# Patient Record
Sex: Female | Born: 1950 | Race: White | Hispanic: No | Marital: Single | Smoking: Never smoker
Health system: Southern US, Community
[De-identification: ages and names within clinical notes are randomized; demographics above are authoritative.]

## PROBLEM LIST (undated history)

## (undated) ENCOUNTER — Encounter

## (undated) ENCOUNTER — Encounter: Attending: Hematology | Primary: Hematology

## (undated) ENCOUNTER — Telehealth

## (undated) ENCOUNTER — Ambulatory Visit

## (undated) ENCOUNTER — Encounter: Attending: Pharmacist | Primary: Pharmacist

## (undated) ENCOUNTER — Telehealth: Attending: Adult Health | Primary: Adult Health

## (undated) ENCOUNTER — Ambulatory Visit: Payer: PRIVATE HEALTH INSURANCE | Attending: Adult Health | Primary: Adult Health

## (undated) ENCOUNTER — Telehealth: Attending: Hematology | Primary: Hematology

## (undated) ENCOUNTER — Ambulatory Visit: Payer: PRIVATE HEALTH INSURANCE

## (undated) ENCOUNTER — Ambulatory Visit: Payer: PRIVATE HEALTH INSURANCE | Attending: Hematology | Primary: Hematology

## (undated) ENCOUNTER — Ambulatory Visit: Attending: Hematology & Oncology | Primary: Hematology & Oncology

## (undated) ENCOUNTER — Ambulatory Visit: Payer: PRIVATE HEALTH INSURANCE | Attending: Pharmacist | Primary: Pharmacist

## (undated) ENCOUNTER — Encounter: Attending: Adult Health | Primary: Adult Health

## (undated) ENCOUNTER — Telehealth: Attending: Hematology & Oncology | Primary: Hematology & Oncology

## (undated) DIAGNOSIS — F419 Anxiety disorder, unspecified: Secondary | ICD-10-CM

## (undated) DIAGNOSIS — T8859XA Other complications of anesthesia, initial encounter: Secondary | ICD-10-CM

## (undated) DIAGNOSIS — R002 Palpitations: Secondary | ICD-10-CM

## (undated) DIAGNOSIS — E7800 Pure hypercholesterolemia, unspecified: Secondary | ICD-10-CM

## (undated) DIAGNOSIS — Z8719 Personal history of other diseases of the digestive system: Secondary | ICD-10-CM

## (undated) DIAGNOSIS — Z85038 Personal history of other malignant neoplasm of large intestine: Secondary | ICD-10-CM

## (undated) DIAGNOSIS — E039 Hypothyroidism, unspecified: Secondary | ICD-10-CM

## (undated) DIAGNOSIS — M1990 Unspecified osteoarthritis, unspecified site: Secondary | ICD-10-CM

## (undated) DIAGNOSIS — Z923 Personal history of irradiation: Secondary | ICD-10-CM

## (undated) DIAGNOSIS — C50919 Malignant neoplasm of unspecified site of unspecified female breast: Secondary | ICD-10-CM

## (undated) DIAGNOSIS — E785 Hyperlipidemia, unspecified: Secondary | ICD-10-CM

## (undated) DIAGNOSIS — K769 Liver disease, unspecified: Secondary | ICD-10-CM

## (undated) DIAGNOSIS — R112 Nausea with vomiting, unspecified: Secondary | ICD-10-CM

## (undated) DIAGNOSIS — Z9889 Other specified postprocedural states: Secondary | ICD-10-CM

## (undated) DIAGNOSIS — G609 Hereditary and idiopathic neuropathy, unspecified: Secondary | ICD-10-CM

## (undated) DIAGNOSIS — N200 Calculus of kidney: Secondary | ICD-10-CM

## (undated) DIAGNOSIS — Z9221 Personal history of antineoplastic chemotherapy: Secondary | ICD-10-CM

## (undated) DIAGNOSIS — C189 Malignant neoplasm of colon, unspecified: Secondary | ICD-10-CM

## (undated) DIAGNOSIS — K219 Gastro-esophageal reflux disease without esophagitis: Secondary | ICD-10-CM

## (undated) DIAGNOSIS — Z87442 Personal history of urinary calculi: Secondary | ICD-10-CM

## (undated) DIAGNOSIS — M810 Age-related osteoporosis without current pathological fracture: Secondary | ICD-10-CM

## (undated) DIAGNOSIS — C73 Malignant neoplasm of thyroid gland: Secondary | ICD-10-CM

## (undated) DIAGNOSIS — K7689 Other specified diseases of liver: Secondary | ICD-10-CM

## (undated) HISTORY — DX: Other specified diseases of liver: K76.89

## (undated) HISTORY — DX: Calculus of kidney: N20.0

## (undated) HISTORY — DX: Age-related osteoporosis without current pathological fracture: M81.0

## (undated) HISTORY — DX: Hyperlipidemia, unspecified: E78.5

## (undated) HISTORY — PX: LITHOTRIPSY: SUR834

## (undated) HISTORY — PX: ESOPHAGOGASTRODUODENOSCOPY: SHX1529

## (undated) HISTORY — PX: LAPAROSCOPIC PARTIAL COLECTOMY: SHX5907

## (undated) HISTORY — DX: Malignant neoplasm of thyroid gland: C73

## (undated) HISTORY — DX: Anxiety disorder, unspecified: F41.9

## (undated) HISTORY — DX: Personal history of other malignant neoplasm of large intestine: Z85.038

## (undated) HISTORY — DX: Palpitations: R00.2

## (undated) HISTORY — DX: Gastro-esophageal reflux disease without esophagitis: K21.9

## (undated) HISTORY — PX: COLONOSCOPY: SHX174

## (undated) HISTORY — DX: Hereditary and idiopathic neuropathy, unspecified: G60.9

## (undated) MED ORDER — LOPERAMIDE 2 MG CAPSULE: Freq: Four times a day (QID) | ORAL | 0 days | PRN

---

## 1983-05-05 HISTORY — PX: APPENDECTOMY: SHX54

## 1988-05-04 HISTORY — PX: DILATION AND CURETTAGE, DIAGNOSTIC / THERAPEUTIC: SUR384

## 1988-05-04 HISTORY — PX: DILATION AND CURETTAGE OF UTERUS: SHX78

## 1993-05-04 HISTORY — PX: CHOLECYSTECTOMY: SHX55

## 1993-08-26 HISTORY — PX: SIGMOIDOSCOPY: SUR1295

## 2000-05-04 DIAGNOSIS — C73 Malignant neoplasm of thyroid gland: Secondary | ICD-10-CM

## 2000-05-04 HISTORY — PX: THYROID LOBECTOMY: SHX420

## 2000-05-04 HISTORY — DX: Malignant neoplasm of thyroid gland: C73

## 2004-04-03 ENCOUNTER — Ambulatory Visit: Payer: Self-pay | Admitting: Unknown Physician Specialty

## 2004-04-07 ENCOUNTER — Ambulatory Visit: Payer: Self-pay | Admitting: Unknown Physician Specialty

## 2004-04-22 ENCOUNTER — Inpatient Hospital Stay: Payer: Self-pay | Admitting: General Surgery

## 2004-05-07 ENCOUNTER — Ambulatory Visit: Payer: Self-pay | Admitting: Internal Medicine

## 2004-05-09 ENCOUNTER — Ambulatory Visit: Payer: Self-pay | Admitting: Surgery

## 2004-06-04 ENCOUNTER — Ambulatory Visit: Payer: Self-pay | Admitting: Internal Medicine

## 2004-07-02 ENCOUNTER — Ambulatory Visit: Payer: Self-pay | Admitting: Internal Medicine

## 2004-07-02 ENCOUNTER — Inpatient Hospital Stay: Payer: Self-pay | Admitting: Internal Medicine

## 2004-08-02 ENCOUNTER — Ambulatory Visit: Payer: Self-pay | Admitting: Internal Medicine

## 2004-08-04 ENCOUNTER — Inpatient Hospital Stay: Payer: Self-pay | Admitting: Internal Medicine

## 2004-09-01 ENCOUNTER — Ambulatory Visit: Payer: Self-pay | Admitting: Internal Medicine

## 2004-10-02 ENCOUNTER — Ambulatory Visit: Payer: Self-pay | Admitting: Internal Medicine

## 2004-11-01 ENCOUNTER — Ambulatory Visit: Payer: Self-pay | Admitting: Internal Medicine

## 2004-12-02 ENCOUNTER — Ambulatory Visit: Payer: Self-pay | Admitting: Internal Medicine

## 2005-01-02 ENCOUNTER — Ambulatory Visit: Payer: Self-pay | Admitting: Internal Medicine

## 2005-02-01 ENCOUNTER — Ambulatory Visit: Payer: Self-pay | Admitting: Internal Medicine

## 2005-03-04 ENCOUNTER — Ambulatory Visit: Payer: Self-pay | Admitting: Internal Medicine

## 2005-04-03 ENCOUNTER — Ambulatory Visit: Payer: Self-pay | Admitting: Internal Medicine

## 2005-05-04 ENCOUNTER — Ambulatory Visit: Payer: Self-pay | Admitting: Internal Medicine

## 2005-06-04 ENCOUNTER — Ambulatory Visit: Payer: Self-pay | Admitting: Internal Medicine

## 2005-06-29 ENCOUNTER — Ambulatory Visit: Payer: Self-pay | Admitting: Unknown Physician Specialty

## 2005-07-02 ENCOUNTER — Ambulatory Visit: Payer: Self-pay | Admitting: Internal Medicine

## 2005-08-02 ENCOUNTER — Ambulatory Visit: Payer: Self-pay | Admitting: Internal Medicine

## 2005-09-01 ENCOUNTER — Ambulatory Visit: Payer: Self-pay | Admitting: Internal Medicine

## 2005-10-02 ENCOUNTER — Ambulatory Visit: Payer: Self-pay | Admitting: Internal Medicine

## 2005-11-01 ENCOUNTER — Ambulatory Visit: Payer: Self-pay | Admitting: Internal Medicine

## 2005-12-03 ENCOUNTER — Ambulatory Visit: Payer: Self-pay | Admitting: Internal Medicine

## 2006-01-02 ENCOUNTER — Ambulatory Visit: Payer: Self-pay | Admitting: Internal Medicine

## 2006-02-01 ENCOUNTER — Ambulatory Visit: Payer: Self-pay | Admitting: Internal Medicine

## 2006-03-04 ENCOUNTER — Ambulatory Visit: Payer: Self-pay | Admitting: Internal Medicine

## 2006-04-03 ENCOUNTER — Ambulatory Visit: Payer: Self-pay | Admitting: Internal Medicine

## 2006-05-27 ENCOUNTER — Ambulatory Visit: Payer: Self-pay | Admitting: Internal Medicine

## 2006-06-04 ENCOUNTER — Ambulatory Visit: Payer: Self-pay | Admitting: Internal Medicine

## 2006-07-03 ENCOUNTER — Ambulatory Visit: Payer: Self-pay | Admitting: Internal Medicine

## 2006-08-03 ENCOUNTER — Ambulatory Visit: Payer: Self-pay | Admitting: Internal Medicine

## 2006-09-09 ENCOUNTER — Ambulatory Visit: Payer: Self-pay | Admitting: Internal Medicine

## 2006-09-09 ENCOUNTER — Ambulatory Visit: Payer: Self-pay | Admitting: Radiation Oncology

## 2006-09-15 ENCOUNTER — Other Ambulatory Visit: Payer: Self-pay

## 2006-09-15 ENCOUNTER — Inpatient Hospital Stay: Payer: Self-pay | Admitting: Internal Medicine

## 2006-09-30 ENCOUNTER — Ambulatory Visit: Payer: Self-pay | Admitting: Internal Medicine

## 2006-10-03 ENCOUNTER — Ambulatory Visit: Payer: Self-pay | Admitting: Internal Medicine

## 2006-10-14 ENCOUNTER — Ambulatory Visit: Payer: Self-pay | Admitting: Internal Medicine

## 2006-10-27 ENCOUNTER — Ambulatory Visit: Payer: Self-pay | Admitting: Surgery

## 2006-11-01 ENCOUNTER — Ambulatory Visit: Payer: Self-pay | Admitting: Internal Medicine

## 2006-11-02 ENCOUNTER — Ambulatory Visit: Payer: Self-pay | Admitting: Internal Medicine

## 2007-02-02 ENCOUNTER — Ambulatory Visit: Payer: Self-pay | Admitting: Internal Medicine

## 2007-03-02 ENCOUNTER — Ambulatory Visit: Payer: Self-pay | Admitting: Internal Medicine

## 2007-03-05 ENCOUNTER — Ambulatory Visit: Payer: Self-pay | Admitting: Internal Medicine

## 2007-03-15 ENCOUNTER — Ambulatory Visit: Payer: Self-pay | Admitting: Ophthalmology

## 2007-04-04 ENCOUNTER — Ambulatory Visit: Payer: Self-pay | Admitting: Internal Medicine

## 2007-04-20 ENCOUNTER — Ambulatory Visit: Payer: Self-pay | Admitting: Surgery

## 2007-05-05 DIAGNOSIS — C50919 Malignant neoplasm of unspecified site of unspecified female breast: Secondary | ICD-10-CM

## 2007-05-05 HISTORY — PX: BREAST BIOPSY: SHX20

## 2007-05-05 HISTORY — PX: BREAST LUMPECTOMY: SHX2

## 2007-05-05 HISTORY — DX: Malignant neoplasm of unspecified site of unspecified female breast: C50.919

## 2007-06-05 ENCOUNTER — Ambulatory Visit: Payer: Self-pay | Admitting: Internal Medicine

## 2007-06-09 ENCOUNTER — Ambulatory Visit: Payer: Self-pay | Admitting: Surgery

## 2007-06-29 ENCOUNTER — Ambulatory Visit: Payer: Self-pay | Admitting: Internal Medicine

## 2007-07-03 ENCOUNTER — Ambulatory Visit: Payer: Self-pay | Admitting: Internal Medicine

## 2007-09-21 ENCOUNTER — Ambulatory Visit: Payer: Self-pay | Admitting: Unknown Physician Specialty

## 2007-10-06 ENCOUNTER — Ambulatory Visit: Payer: Self-pay | Admitting: Urology

## 2007-11-02 ENCOUNTER — Ambulatory Visit: Payer: Self-pay | Admitting: Internal Medicine

## 2007-12-29 ENCOUNTER — Ambulatory Visit: Payer: Self-pay | Admitting: Internal Medicine

## 2008-01-03 ENCOUNTER — Ambulatory Visit: Payer: Self-pay | Admitting: Internal Medicine

## 2008-01-25 ENCOUNTER — Ambulatory Visit: Payer: Self-pay | Admitting: Cardiovascular Disease

## 2008-01-25 ENCOUNTER — Other Ambulatory Visit: Payer: Self-pay

## 2008-01-25 ENCOUNTER — Ambulatory Visit: Payer: Self-pay | Admitting: Surgery

## 2008-02-01 ENCOUNTER — Ambulatory Visit: Payer: Self-pay | Admitting: Surgery

## 2008-02-02 ENCOUNTER — Ambulatory Visit: Payer: Self-pay | Admitting: Internal Medicine

## 2008-03-01 ENCOUNTER — Ambulatory Visit: Payer: Self-pay | Admitting: Surgery

## 2008-03-04 ENCOUNTER — Ambulatory Visit: Payer: Self-pay | Admitting: Internal Medicine

## 2008-03-04 ENCOUNTER — Ambulatory Visit: Payer: Self-pay | Admitting: Radiation Oncology

## 2008-04-03 ENCOUNTER — Ambulatory Visit: Payer: Self-pay | Admitting: Radiation Oncology

## 2008-04-11 ENCOUNTER — Ambulatory Visit: Payer: Self-pay | Admitting: Internal Medicine

## 2008-05-04 ENCOUNTER — Ambulatory Visit: Payer: Self-pay | Admitting: Internal Medicine

## 2008-05-04 ENCOUNTER — Ambulatory Visit: Payer: Self-pay | Admitting: Radiation Oncology

## 2008-05-17 ENCOUNTER — Inpatient Hospital Stay: Payer: Self-pay | Admitting: Internal Medicine

## 2008-06-04 ENCOUNTER — Ambulatory Visit: Payer: Self-pay | Admitting: Radiation Oncology

## 2008-06-04 ENCOUNTER — Ambulatory Visit: Payer: Self-pay | Admitting: Internal Medicine

## 2008-07-02 ENCOUNTER — Ambulatory Visit: Payer: Self-pay | Admitting: Radiation Oncology

## 2008-07-02 ENCOUNTER — Ambulatory Visit: Payer: Self-pay | Admitting: Internal Medicine

## 2008-08-02 ENCOUNTER — Ambulatory Visit: Payer: Self-pay | Admitting: Internal Medicine

## 2008-08-02 ENCOUNTER — Ambulatory Visit: Payer: Self-pay | Admitting: Radiation Oncology

## 2008-09-01 ENCOUNTER — Ambulatory Visit: Payer: Self-pay | Admitting: Internal Medicine

## 2008-09-01 ENCOUNTER — Ambulatory Visit: Payer: Self-pay | Admitting: Radiation Oncology

## 2008-09-03 ENCOUNTER — Ambulatory Visit: Payer: Self-pay | Admitting: Cardiology

## 2008-09-18 ENCOUNTER — Encounter: Payer: Self-pay | Admitting: Cardiology

## 2008-09-18 ENCOUNTER — Ambulatory Visit: Payer: Self-pay

## 2008-09-18 DIAGNOSIS — E7800 Pure hypercholesterolemia, unspecified: Secondary | ICD-10-CM | POA: Insufficient documentation

## 2008-09-18 DIAGNOSIS — Z85038 Personal history of other malignant neoplasm of large intestine: Secondary | ICD-10-CM | POA: Insufficient documentation

## 2008-09-18 DIAGNOSIS — Z853 Personal history of malignant neoplasm of breast: Secondary | ICD-10-CM | POA: Insufficient documentation

## 2008-09-18 DIAGNOSIS — C73 Malignant neoplasm of thyroid gland: Secondary | ICD-10-CM | POA: Insufficient documentation

## 2008-09-18 DIAGNOSIS — Z85850 Personal history of malignant neoplasm of thyroid: Secondary | ICD-10-CM

## 2008-09-18 DIAGNOSIS — R002 Palpitations: Secondary | ICD-10-CM | POA: Insufficient documentation

## 2008-09-18 DIAGNOSIS — C50919 Malignant neoplasm of unspecified site of unspecified female breast: Secondary | ICD-10-CM | POA: Insufficient documentation

## 2008-09-18 DIAGNOSIS — G629 Polyneuropathy, unspecified: Secondary | ICD-10-CM

## 2008-09-18 DIAGNOSIS — G620 Drug-induced polyneuropathy: Secondary | ICD-10-CM | POA: Insufficient documentation

## 2008-09-18 DIAGNOSIS — G609 Hereditary and idiopathic neuropathy, unspecified: Secondary | ICD-10-CM | POA: Insufficient documentation

## 2008-09-18 HISTORY — DX: Personal history of malignant neoplasm of thyroid: Z85.850

## 2008-09-28 ENCOUNTER — Telehealth: Payer: Self-pay | Admitting: Cardiology

## 2008-10-02 ENCOUNTER — Ambulatory Visit: Payer: Self-pay | Admitting: Internal Medicine

## 2008-10-02 ENCOUNTER — Ambulatory Visit: Payer: Self-pay | Admitting: Radiation Oncology

## 2008-11-01 ENCOUNTER — Ambulatory Visit: Payer: Self-pay | Admitting: Internal Medicine

## 2008-11-01 ENCOUNTER — Ambulatory Visit: Payer: Self-pay | Admitting: Radiation Oncology

## 2008-12-02 ENCOUNTER — Ambulatory Visit: Payer: Self-pay | Admitting: Internal Medicine

## 2008-12-18 ENCOUNTER — Ambulatory Visit: Payer: Self-pay | Admitting: Internal Medicine

## 2009-01-02 ENCOUNTER — Ambulatory Visit: Payer: Self-pay | Admitting: Internal Medicine

## 2009-02-01 ENCOUNTER — Ambulatory Visit: Payer: Self-pay | Admitting: Internal Medicine

## 2009-03-04 ENCOUNTER — Ambulatory Visit: Payer: Self-pay | Admitting: Internal Medicine

## 2009-03-20 ENCOUNTER — Ambulatory Visit: Payer: Self-pay | Admitting: Internal Medicine

## 2009-04-03 ENCOUNTER — Ambulatory Visit: Payer: Self-pay | Admitting: Internal Medicine

## 2009-05-16 ENCOUNTER — Ambulatory Visit: Payer: Self-pay | Admitting: Urology

## 2009-06-04 ENCOUNTER — Ambulatory Visit: Payer: Self-pay | Admitting: Internal Medicine

## 2009-06-20 ENCOUNTER — Ambulatory Visit: Payer: Self-pay | Admitting: Internal Medicine

## 2009-07-02 ENCOUNTER — Ambulatory Visit: Payer: Self-pay | Admitting: Internal Medicine

## 2009-07-18 ENCOUNTER — Ambulatory Visit: Payer: Self-pay | Admitting: Internal Medicine

## 2009-07-29 ENCOUNTER — Ambulatory Visit: Payer: Self-pay | Admitting: Unknown Physician Specialty

## 2009-08-02 ENCOUNTER — Ambulatory Visit: Payer: Self-pay | Admitting: Internal Medicine

## 2009-08-14 ENCOUNTER — Ambulatory Visit: Payer: Self-pay | Admitting: Internal Medicine

## 2009-09-01 ENCOUNTER — Ambulatory Visit: Payer: Self-pay | Admitting: Internal Medicine

## 2009-09-11 ENCOUNTER — Ambulatory Visit: Payer: Self-pay | Admitting: Ophthalmology

## 2009-09-16 ENCOUNTER — Ambulatory Visit: Payer: Self-pay | Admitting: Ophthalmology

## 2009-10-07 ENCOUNTER — Ambulatory Visit: Payer: Self-pay | Admitting: Unknown Physician Specialty

## 2009-10-10 ENCOUNTER — Ambulatory Visit: Payer: Self-pay | Admitting: Unknown Physician Specialty

## 2009-10-29 ENCOUNTER — Ambulatory Visit: Payer: Self-pay | Admitting: Sports Medicine

## 2009-11-01 ENCOUNTER — Ambulatory Visit: Payer: Self-pay | Admitting: Internal Medicine

## 2009-11-19 ENCOUNTER — Ambulatory Visit: Payer: Self-pay | Admitting: Internal Medicine

## 2009-11-21 LAB — CEA: CEA: 1.9 ng/mL (ref 0.0–4.7)

## 2009-11-21 LAB — CANCER ANTIGEN 27.29: CA 27.29: 19.4 U/mL (ref 0.0–38.6)

## 2009-12-02 ENCOUNTER — Ambulatory Visit: Payer: Self-pay | Admitting: Internal Medicine

## 2010-01-28 ENCOUNTER — Ambulatory Visit: Payer: Self-pay | Admitting: Internal Medicine

## 2010-01-30 ENCOUNTER — Ambulatory Visit: Payer: Self-pay | Admitting: Internal Medicine

## 2010-05-23 ENCOUNTER — Ambulatory Visit: Payer: Self-pay | Admitting: Internal Medicine

## 2010-05-25 LAB — CANCER ANTIGEN 27.29: CA 27.29: 19.9 U/mL

## 2010-05-25 LAB — CEA: CEA: 1.4 ng/mL

## 2010-06-04 ENCOUNTER — Ambulatory Visit: Payer: Self-pay | Admitting: Internal Medicine

## 2010-08-05 ENCOUNTER — Ambulatory Visit: Payer: Self-pay | Admitting: Internal Medicine

## 2010-09-16 NOTE — Assessment & Plan Note (Signed)
[facility] OFFICE NOTE   NAME:Debra Hale, Debra Hale                       MRN:          [id number]  DATE:09/03/2008                            DOB:          1951-03-17    PRIMARY CARE PHYSICIAN:  Dale [place] at the [facility].   ONCOLOGIST:  Janese Banks, MD at the [facility].   HISTORY OF PRESENT ILLNESS:  This is a 60 year old with a history of  breast cancer, undergoing radiation currently, who presents to  Cardiology Clinic for evaluation of palpitations.  The patient states  that she has had some palpitations since she was 69 or 60 years old.  They have been sporadic over the years.  When she was in her 30s, she  had a Holter monitor that per her report was okay.  In September 2009,  she states she began having quite significant palpitations.  She went to  [facility] through the emergency department.  She  had an exercise treadmill test per her report that was normal.  There  were no abnormalities noted on EKG or on telemetry.  She was in the  hospital overnight.  The patient states that over the last 2-3 weeks,  she has had quite severe palpitations again.  They have been daily,  coming on for most of the day.  She states she will feel skipped beats  and also occasionally she will feel like her heart races.  She has never  had any episodes of syncope or lightheadedness.  She does not get chest  pain.  She does not get shortness of breath with exertion.  Ever since  she did chemotherapy earlier this year, she has been fatigued.  She  states the main problem with the palpitations is that they are  bothersome and can keep her from sleeping at night.  She does feel them  more if she lies on her left side at night.  As mentioned, she does get  the symptoms daily.   PAST MEDICAL HISTORY:  1. Breast cancer.  In September 2009, the patient had a  lumpectomy in      her right breast.  This showed stage I breast cancer.  She did have      adjuvant chemotherapy with Taxotere and Cytoxan and she is now      undergoing radiation.  2. History of colon cancer, this was stage III.  She had a colectomy      in December 2005 and adjuvant chemotherapy and radiation.  3. History of palpitations as described above.  4. Papillary thyroid carcinoma.  This was in 2002.  The patient was      treated with thyroidectomy and radioactive iodine.  She is now      hypothyroid on Synthroid.  5. Appendectomy.  6. Cholecystectomy.  7. Hyperlipidemia.  8. 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   FAMILY HISTORY:  The patient has a family history of breast, liver, and  lung cancer.  Her brother had an MI, she believes in his 4s.  SOCIAL HISTORY:  The patient does not smoke.  She does not drink any  alcohol.  She lives by herself in [place].  She does have a boyfriend  who is involved.   REVIEW OF SYSTEMS:  All systems were reviewed and were negative except  as noted in the history of present illness.   MEDICATIONS:  1. Gabapentin.  2. Zocor 10 mg daily.  3. Protonix 40 mg daily.  4. Levoxyl 88 mcg daily.  5. Lexapro 20 mg daily.   LABORATORY DATA:  Most recent labs from April 2010, potassium was  normal, creatinine 0.95.  TSH was 0, however, the total T4 was 11.8,  which is normal.   EKG was reviewed showed normal sinus rhythm, is in normal EKG.   PHYSICAL EXAMINATION:  VITAL SIGNS:  Blood pressure 117/79 and heart  rate is 72 and regular.  GENERAL:  This is a well-developed female in no apparent distress.  NEUROLOGIC:  Alert and oriented x3.  Normal affect.  LUNGS:  Clear to auscultation bilaterally with normal respiratory  effort.  CARDIOVASCULAR:  Heart regular S1 and S2.  No S3.  No S4.  There is no  murmur.  EXTREMITIES:  There is no peripheral edema.  There are 2+ posterior  tibial pulses bilaterally.  There  is no carotid bruit.  No clubbing or  cyanosis.  ABDOMEN:  Soft and nontender.  No hepatosplenomegaly.  Normal bowel  sounds.  SKIN:  There is some erythema over her right breast consistent with  radiation changes.  NECK:  There is no JVD.  There is no thyromegaly or thyroid nodules.  MUSCULOSKELETAL:  Normal exam.  HEENT:  Normal exam.   ASSESSMENT AND PLAN:  This is a 60 year old with a history of breast  cancer, colon cancer, and papillary thyroid carcinoma who has most  recently undergone chemotherapy  and radiation for breast cancer and  presents to Cardiology Clinic for evaluation of palpitations.  The  patient has had palpitations that sound long-term ever since she was a  child.  Workup has included a Holter monitor a number of years ago that  showed apparently no abnormalities.  She states the palpitations have  been coming on more frequently lately.  She has never had syncope or  presyncope.  Most recent TSH was 0.  However, the total T4 was normal.  Our plan will be to do a 48-hour Holter monitor to rule out any  significant arrhythmias.  I think most likely her palpitations  represents benign PACs or PVCs.  If they continued to be significantly  bothersome, we could try her on a low dose of beta-blocker to try to  decrease her symptoms.  We will also obtain an echocardiogram to assess  her LV function make sure her heart is structurally normal.  This is  especially relevant given her history of chemotherapy.  The patient will  see Dr. Lorin Picket tomorrow.  I will have her follow up with Dr. Lorin Picket  regarding her thyroid function tests.  It is  interesting that her TSH is 0, however, her T4 is within normal limits.  I think it might be a reasonable idea to repeat her free T4 and TSH to  see if that is accurate.     Marca Ancona, MD  Electronically Signed    DM/MedQ  DD: 09/03/2008  DT: 09/04/2008  Job #: [id number]   cc:   Janese Banks, MD  [facility]

## 2010-09-24 ENCOUNTER — Ambulatory Visit: Payer: Self-pay | Admitting: Internal Medicine

## 2010-10-16 ENCOUNTER — Encounter: Payer: Self-pay | Admitting: Cardiology

## 2010-10-21 ENCOUNTER — Ambulatory Visit: Payer: Self-pay | Admitting: Internal Medicine

## 2010-11-21 ENCOUNTER — Ambulatory Visit: Payer: Self-pay | Admitting: Internal Medicine

## 2010-11-22 LAB — CANCER ANTIGEN 27.29: CA 27.29: 26.7 U/mL (ref 0.0–38.6)

## 2010-11-22 LAB — CEA: CEA: 2.3 ng/mL (ref 0.0–4.7)

## 2010-12-03 ENCOUNTER — Ambulatory Visit: Payer: Self-pay | Admitting: Internal Medicine

## 2011-01-05 LAB — HM PAP SMEAR

## 2011-05-05 ENCOUNTER — Ambulatory Visit: Payer: Self-pay | Admitting: Internal Medicine

## 2011-05-18 ENCOUNTER — Ambulatory Visit: Payer: Self-pay | Admitting: Unknown Physician Specialty

## 2011-05-22 LAB — HEPATIC FUNCTION PANEL A (ARMC)
Bilirubin, Direct: 0.1 mg/dL (ref 0.00–0.20)
SGOT(AST): 13 U/L — ABNORMAL LOW (ref 15–37)
SGPT (ALT): 31 U/L

## 2011-05-22 LAB — CBC CANCER CENTER
Eosinophil #: 0.1 x10 3/mm (ref 0.0–0.7)
Eosinophil %: 2.4 %
HCT: 42.5 % (ref 35.0–47.0)
HGB: 14.7 g/dL (ref 12.0–16.0)
Lymphocyte #: 0.8 x10 3/mm — ABNORMAL LOW (ref 1.0–3.6)
Monocyte #: 0.3 x10 3/mm (ref 0.0–0.7)
Neutrophil #: 3.1 x10 3/mm (ref 1.4–6.5)
WBC: 4.3 x10 3/mm (ref 3.6–11.0)

## 2011-05-22 LAB — CREATININE, SERUM: Creatinine: 1.07 mg/dL (ref 0.60–1.30)

## 2011-05-24 LAB — CEA: CEA: 2.5 ng/mL (ref 0.0–4.7)

## 2011-05-24 LAB — CANCER ANTIGEN 27.29: CA 27.29: 12.2 U/mL (ref 0.0–38.6)

## 2011-06-05 ENCOUNTER — Ambulatory Visit: Payer: Self-pay | Admitting: Internal Medicine

## 2011-08-06 ENCOUNTER — Ambulatory Visit: Payer: Self-pay | Admitting: Internal Medicine

## 2011-08-14 ENCOUNTER — Other Ambulatory Visit: Payer: Self-pay | Admitting: Unknown Physician Specialty

## 2011-08-14 ENCOUNTER — Ambulatory Visit: Payer: Self-pay | Admitting: Unknown Physician Specialty

## 2011-08-14 LAB — CLOSTRIDIUM DIFFICILE BY PCR

## 2011-08-17 ENCOUNTER — Ambulatory Visit: Payer: Self-pay | Admitting: Unknown Physician Specialty

## 2011-11-20 ENCOUNTER — Ambulatory Visit: Payer: Self-pay | Admitting: Internal Medicine

## 2011-11-20 LAB — CBC CANCER CENTER
Basophil #: 0 x10 3/mm (ref 0.0–0.1)
Basophil %: 0.4 %
Eosinophil %: 2.3 %
HCT: 42.2 % (ref 35.0–47.0)
HGB: 14 g/dL (ref 12.0–16.0)
Lymphocyte #: 0.8 x10 3/mm — ABNORMAL LOW (ref 1.0–3.6)
Lymphocyte %: 20.2 %
MCH: 29.7 pg (ref 26.0–34.0)
MCHC: 33.2 g/dL (ref 32.0–36.0)
MCV: 90 fL (ref 80–100)
Monocyte #: 0.2 x10 3/mm (ref 0.2–0.9)
RBC: 4.71 10*6/uL (ref 3.80–5.20)

## 2011-11-20 LAB — HEPATIC FUNCTION PANEL A (ARMC)
Albumin: 3.6 g/dL (ref 3.4–5.0)
Bilirubin, Direct: 0.1 mg/dL (ref 0.00–0.20)
SGOT(AST): 18 U/L (ref 15–37)
SGPT (ALT): 43 U/L
Total Protein: 7 g/dL (ref 6.4–8.2)

## 2011-11-20 LAB — CREATININE, SERUM
Creatinine: 1.15 mg/dL (ref 0.60–1.30)
EGFR (African American): 59 — ABNORMAL LOW

## 2011-11-21 LAB — CANCER ANTIGEN 27.29: CA 27.29: 18.5 U/mL (ref 0.0–38.6)

## 2011-12-03 ENCOUNTER — Ambulatory Visit: Payer: Self-pay | Admitting: Internal Medicine

## 2012-01-03 ENCOUNTER — Ambulatory Visit: Payer: Self-pay | Admitting: Internal Medicine

## 2012-01-05 LAB — HM MAMMOGRAPHY

## 2012-01-08 ENCOUNTER — Ambulatory Visit: Payer: Self-pay | Admitting: Unknown Physician Specialty

## 2012-04-14 ENCOUNTER — Other Ambulatory Visit: Payer: Self-pay | Admitting: Internal Medicine

## 2012-04-14 NOTE — Telephone Encounter (Signed)
Pt is needing refill on Protonics. She uses [facility] on [address].

## 2012-04-15 MED ORDER — PANTOPRAZOLE SODIUM 40 MG PO TBEC: 40.0000 mg | DELAYED_RELEASE_TABLET | Freq: Every day | ORAL | Status: DC

## 2012-04-15 NOTE — Telephone Encounter (Signed)
Sent in to pharmacy.  

## 2012-04-18 ENCOUNTER — Other Ambulatory Visit: Payer: Self-pay | Admitting: *Deleted

## 2012-04-18 MED ORDER — PANTOPRAZOLE SODIUM 40 MG PO TBEC: 40.0000 mg | DELAYED_RELEASE_TABLET | Freq: Every day | ORAL | Status: DC

## 2012-04-22 ENCOUNTER — Other Ambulatory Visit: Payer: Self-pay | Admitting: *Deleted

## 2012-04-22 MED ORDER — SIMVASTATIN 10 MG PO TABS: 10.0000 mg | ORAL_TABLET | Freq: Every day | ORAL | Status: DC

## 2012-04-22 NOTE — Telephone Encounter (Signed)
Patient called and said that she was almost out of simvastatin. Sent in to pharmacy.

## 2012-05-04 ENCOUNTER — Ambulatory Visit: Payer: Self-pay | Admitting: Internal Medicine

## 2012-05-05 ENCOUNTER — Ambulatory Visit: Payer: Self-pay | Admitting: Internal Medicine

## 2012-05-06 ENCOUNTER — Encounter: Payer: Self-pay | Admitting: Internal Medicine

## 2012-05-06 ENCOUNTER — Ambulatory Visit (INDEPENDENT_AMBULATORY_CARE_PROVIDER_SITE_OTHER): Payer: Medicare Other | Admitting: Internal Medicine

## 2012-05-06 VITALS — BP 120/80 | HR 77 | Temp 98.1°F | Ht 64.0 in | Wt 146.2 lb

## 2012-05-06 DIAGNOSIS — C73 Malignant neoplasm of thyroid gland: Secondary | ICD-10-CM

## 2012-05-06 DIAGNOSIS — M8580 Other specified disorders of bone density and structure, unspecified site: Secondary | ICD-10-CM | POA: Insufficient documentation

## 2012-05-06 DIAGNOSIS — F419 Anxiety disorder, unspecified: Secondary | ICD-10-CM

## 2012-05-06 DIAGNOSIS — R319 Hematuria, unspecified: Secondary | ICD-10-CM | POA: Insufficient documentation

## 2012-05-06 DIAGNOSIS — E785 Hyperlipidemia, unspecified: Secondary | ICD-10-CM

## 2012-05-06 DIAGNOSIS — M810 Age-related osteoporosis without current pathological fracture: Secondary | ICD-10-CM | POA: Insufficient documentation

## 2012-05-06 DIAGNOSIS — K219 Gastro-esophageal reflux disease without esophagitis: Secondary | ICD-10-CM

## 2012-05-06 DIAGNOSIS — Z85038 Personal history of other malignant neoplasm of large intestine: Secondary | ICD-10-CM

## 2012-05-06 DIAGNOSIS — R5381 Other malaise: Secondary | ICD-10-CM

## 2012-05-06 DIAGNOSIS — C50919 Malignant neoplasm of unspecified site of unspecified female breast: Secondary | ICD-10-CM

## 2012-05-06 DIAGNOSIS — F411 Generalized anxiety disorder: Secondary | ICD-10-CM

## 2012-05-06 DIAGNOSIS — R5383 Other fatigue: Secondary | ICD-10-CM

## 2012-05-06 NOTE — Patient Instructions (Addendum)
It was nice seeing you today.  I want you to continue your protonix in the morning and add Zantac (ranitidine) 150mg  - 30 minutes before your evening meal.  Let me know if persistent problems.

## 2012-05-08 ENCOUNTER — Encounter: Payer: Self-pay | Admitting: Internal Medicine

## 2012-05-08 ENCOUNTER — Telehealth: Payer: Self-pay | Admitting: Internal Medicine

## 2012-05-08 MED ORDER — AZELASTINE-FLUTICASONE 137-50 MCG/ACT NA SUSP: 1.0000 | Freq: Two times a day (BID) | NASAL | Status: DC

## 2012-05-08 NOTE — Assessment & Plan Note (Signed)
Stable on current regimen.  Follow.   

## 2012-05-08 NOTE — Telephone Encounter (Signed)
Order placed for dymista

## 2012-05-08 NOTE — Assessment & Plan Note (Signed)
Low cholesterol diet and exercise.  Check lipid panel and liver function.  On simvastatin.

## 2012-05-08 NOTE — Assessment & Plan Note (Signed)
Was referred to Dr Evelene Croon for further w/up.  Recheck urinalysis.

## 2012-05-08 NOTE — Assessment & Plan Note (Signed)
Up to date with her colonoscopy.  See above.  Currently asymptomatic.

## 2012-05-08 NOTE — Progress Notes (Signed)
Subjective:    Patient ID: Debra Hale, female    DOB: 10-Dec-1950, 62 y.o.   MRN: [id number]  HPI 62 year old female with past history of thyroid carcinoma s/p surgery and XRT, colon cancer s/p laparoscopic right colectomy followed by Dr Sherrlyn Hock, nephrolithiasis, anxiety, osteoporosis and breast cancer who comes in today for a scheduled follow up.  She states she is doing well.  On Protonix.  Still having some break through symptoms.  Allergy symptoms are doing better.  No cardiac symptoms with increased activity or exertion.  Breathing stable.    Past Medical History  Diagnosis Date  . Palpitations   . Other and unspecified hyperlipidemia   . Unspecified hereditary and idiopathic peripheral neuropathy   . Personal history of malignant neoplasm of large intestine     carcinoma - cecum, s/p right laparoscopic colectomy - s/p chemotherapy and XRT  . Malignant neoplasm of breast (female), unspecified site     s/p lumpectomy  . Malignant neoplasm of thyroid gland     s/p surgery and XRT  . Anxiety   . Liver nodule     s/p negative biopsy  . Nephrolithiasis     s/p lithotripsy  . GERD (gastroesophageal reflux disease)   . Osteoporosis     Current Outpatient Prescriptions on File Prior to Visit  Medication Sig Dispense Refill  . azelastine (ASTELIN) 137 MCG/SPRAY nasal spray Place 1 spray into the nose 2 (two) times daily. Use in each nostril as directed      . clonazePAM (KLONOPIN) 1 MG tablet Take 1 mg by mouth 2 (two) times daily as needed.        Marland Kitchen escitalopram (LEXAPRO) 20 MG tablet Take 20 mg by mouth daily.        Marland Kitchen gabapentin (NEURONTIN) 100 MG capsule Take 100 mg by mouth 3 (three) times daily. Take 2 tab       . hyoscyamine (LEVSIN SL) 0.125 MG SL tablet As directed      . levothyroxine (SYNTHROID) 100 MCG tablet Take 100 mcg by mouth daily.      . pantoprazole (PROTONIX) 40 MG tablet Take 1 tablet (40 mg total) by mouth daily.  30 tablet  0  . simvastatin (ZOCOR) 10 MG  tablet Take 1 tablet (10 mg total) by mouth at bedtime.  30 tablet  0    Review of Systems Patient denies any headache, lightheadedness or dizziness.  Allergies doing better on Dyamist.  No chest pain, tightness or palpitations.  No increased shortness of breath, cough or congestion.  No nausea or vomiting.  No abdominal pain or cramping.  No bowel change, such as diarrhea, constipation, BRBPR or melana.  No urine change.        Objective:   Physical Exam Filed Vitals:   05/06/12 1022  BP: 120/80  Pulse: 77  Temp: 98.1 F (62.78 C)   62 year old female in no acute distress.   HEENT:  Nares- clear.  Oropharynx - without lesions. NECK:  Supple.  Nontender.  No audible bruit.  HEART:  Appears to be regular. LUNGS:  No crackles or wheezing audible.  Respirations even and unlabored.  RADIAL PULSE:  Equal bilaterally.    BREASTS:  No nipple discharge or nipple retraction present.  Could not appreciate any distinct nodules or axillary adenopathy.  ABDOMEN:  Soft, nontender.  Bowel sounds present and normal.  No audible abdominal bruit.  GU:  Normal external genitalia.  Vaginal vault without lesions.  Could not appreciate any adnexal masses or tenderness.   RECTAL:  Heme negative.   EXTREMITIES:  No increased edema present.  DP pulses palpable and equal bilaterally.              Assessment & Plan:  INCREASED PSYCHOSOCIAL STRESSORS.  On Lexapro and Clonazepam.  Doing well.  Follow.   HEALTH MAINTENANCE.  Physical today.  Mammograms through Dr Silverio Lay office.  Colonoscopy 05/18/11 revealed internal hemorrhoids.  Colonoscopy was repeated 4/13.  No evidence of colitis.

## 2012-05-08 NOTE — Assessment & Plan Note (Signed)
On synthroid.  Continue goal - suppressed tsh.  Had recent quantitative thyroglobulin and anti-thyroglobulin antibody.  Obtain results.  Check tsh.

## 2012-05-08 NOTE — Assessment & Plan Note (Addendum)
She declines bisphosphonates.  Continue calcium with vitamin D.  Have checked a 24 hour urine for calcium and was within normal range.  Bone density 05/20/10 improved.  Check 25 hydroxyvitamin D level.

## 2012-05-08 NOTE — Assessment & Plan Note (Signed)
Symptoms as outlined.  Add Zantac in the evening.  Take protonix during in the am.  Schedule a follow up to reassess for complete resolution.

## 2012-05-08 NOTE — Assessment & Plan Note (Addendum)
Followed by Dr Sherrlyn Hock.  Gets her mammograms through Dr Pandit's office.  Has follow up planned with Dr Sherrlyn Hock - 05/20/12.

## 2012-05-17 ENCOUNTER — Other Ambulatory Visit (INDEPENDENT_AMBULATORY_CARE_PROVIDER_SITE_OTHER): Payer: Medicare Other

## 2012-05-17 DIAGNOSIS — R319 Hematuria, unspecified: Secondary | ICD-10-CM

## 2012-05-17 DIAGNOSIS — M810 Age-related osteoporosis without current pathological fracture: Secondary | ICD-10-CM

## 2012-05-17 DIAGNOSIS — R5383 Other fatigue: Secondary | ICD-10-CM

## 2012-05-17 DIAGNOSIS — E785 Hyperlipidemia, unspecified: Secondary | ICD-10-CM

## 2012-05-17 DIAGNOSIS — C73 Malignant neoplasm of thyroid gland: Secondary | ICD-10-CM

## 2012-05-17 DIAGNOSIS — R5381 Other malaise: Secondary | ICD-10-CM

## 2012-05-17 LAB — URINALYSIS, ROUTINE W REFLEX MICROSCOPIC
Specific Gravity, Urine: >=1.03 (ref 1.000–1.030)
Urobilinogen, UA: 0.2 (ref 0.0–1.0)

## 2012-05-17 LAB — COMPREHENSIVE METABOLIC PANEL
ALT: 26 U/L (ref 0–35)
AST: 16 U/L (ref 0–37)
Albumin: 3.9 g/dL (ref 3.5–5.2)
BUN: 20 mg/dL (ref 6–23)
Calcium: 9 mg/dL (ref 8.4–10.5)
Chloride: 106 mEq/L (ref 96–112)
Potassium: 3.5 mEq/L (ref 3.5–5.1)

## 2012-05-17 LAB — LIPID PANEL
LDL Cholesterol: 80 mg/dL (ref 0–99)
Total CHOL/HDL Ratio: 4

## 2012-05-17 LAB — CBC WITH DIFFERENTIAL/PLATELET
Basophils Absolute: 0 10*3/uL (ref 0.0–0.1)
Basophils Relative: 0.3 % (ref 0.0–3.0)
Eosinophils Absolute: 0.1 10*3/uL (ref 0.0–0.7)
Lymphocytes Relative: 23.2 % (ref 12.0–46.0)
MCHC: 34.5 g/dL (ref 30.0–36.0)
Neutrophils Relative %: 67.5 % (ref 43.0–77.0)
RBC: 4.82 Mil/uL (ref 3.87–5.11)

## 2012-05-18 ENCOUNTER — Other Ambulatory Visit: Payer: Self-pay | Admitting: *Deleted

## 2012-05-18 ENCOUNTER — Other Ambulatory Visit: Payer: Self-pay | Admitting: Internal Medicine

## 2012-05-18 ENCOUNTER — Telehealth: Payer: Self-pay | Admitting: *Deleted

## 2012-05-18 DIAGNOSIS — R8281 Pyuria: Secondary | ICD-10-CM

## 2012-05-18 NOTE — Telephone Encounter (Signed)
The ur

## 2012-05-18 NOTE — Progress Notes (Signed)
Need to add a urine culture to the urine collected yesterday.  I have placed order.  Let me know if unable to add.

## 2012-05-20 ENCOUNTER — Encounter: Payer: Self-pay | Admitting: Internal Medicine

## 2012-05-20 LAB — CBC CANCER CENTER
Eosinophil #: 0.1 x10 3/mm (ref 0.0–0.7)
HGB: 14 g/dL (ref 12.0–16.0)
Lymphocyte #: 0.8 x10 3/mm — ABNORMAL LOW (ref 1.0–3.6)
Lymphocyte %: 20 %
MCH: 30.8 pg (ref 26.0–34.0)
MCHC: 35.6 g/dL (ref 32.0–36.0)
Monocyte %: 5.2 %
Platelet: 160 x10 3/mm (ref 150–440)
WBC: 4.2 x10 3/mm (ref 3.6–11.0)

## 2012-05-20 LAB — CREATININE, SERUM
EGFR (African American): >60
EGFR (Non-African Amer.): 55 — ABNORMAL LOW

## 2012-05-20 LAB — CULTURE, URINE COMPREHENSIVE: Organism ID, Bacteria: NO GROWTH

## 2012-05-20 LAB — HEPATIC FUNCTION PANEL A (ARMC)
Albumin: 3.6 g/dL (ref 3.4–5.0)
Alkaline Phosphatase: 113 U/L (ref 50–136)
SGPT (ALT): 39 U/L (ref 12–78)

## 2012-05-21 ENCOUNTER — Telehealth: Payer: Self-pay | Admitting: Internal Medicine

## 2012-05-21 NOTE — Telephone Encounter (Signed)
Pt was notified of lab results and need for follow up pelvic exam.  Please put her on the schedule 05/30/12 at 4:15.  She is not aware of the appt.  Please notify her and notify her that I did not have an early pm appt available and see if this time is ok.  Please also make a note on the schedule that she needs to give a urine sample on arrival (before her pelvic exam).  Thanks.

## 2012-05-23 NOTE — Telephone Encounter (Signed)
Left message for pt to call office.  Please advise her of appointment on 1/27/rbh

## 2012-05-25 ENCOUNTER — Telehealth: Payer: Self-pay | Admitting: Internal Medicine

## 2012-05-25 NOTE — Telephone Encounter (Signed)
Opened in error

## 2012-05-25 NOTE — Telephone Encounter (Signed)
Pt aware of appointment 

## 2012-05-30 ENCOUNTER — Telehealth: Payer: Self-pay | Admitting: Internal Medicine

## 2012-05-30 ENCOUNTER — Other Ambulatory Visit (HOSPITAL_COMMUNITY)
Admission: RE | Admit: 2012-05-30 | Discharge: 2012-05-30 | Disposition: A | Discharge status: Home / Self Care | Payer: Medicare Other | Source: Ambulatory Visit | Attending: Internal Medicine | Admitting: Internal Medicine

## 2012-05-30 ENCOUNTER — Encounter: Payer: Self-pay | Admitting: Internal Medicine

## 2012-05-30 ENCOUNTER — Ambulatory Visit (INDEPENDENT_AMBULATORY_CARE_PROVIDER_SITE_OTHER): Payer: Medicare Other | Admitting: Internal Medicine

## 2012-05-30 ENCOUNTER — Other Ambulatory Visit: Payer: Self-pay | Admitting: Internal Medicine

## 2012-05-30 VITALS — BP 120/70 | HR 78 | Temp 98.2°F | Ht 64.0 in | Wt 143.5 lb

## 2012-05-30 DIAGNOSIS — M810 Age-related osteoporosis without current pathological fracture: Secondary | ICD-10-CM

## 2012-05-30 DIAGNOSIS — K219 Gastro-esophageal reflux disease without esophagitis: Secondary | ICD-10-CM

## 2012-05-30 DIAGNOSIS — Z85038 Personal history of other malignant neoplasm of large intestine: Secondary | ICD-10-CM

## 2012-05-30 DIAGNOSIS — C50919 Malignant neoplasm of unspecified site of unspecified female breast: Secondary | ICD-10-CM

## 2012-05-30 DIAGNOSIS — Z01419 Encounter for gynecological examination (general) (routine) without abnormal findings: Secondary | ICD-10-CM | POA: Insufficient documentation

## 2012-05-30 DIAGNOSIS — N760 Acute vaginitis: Secondary | ICD-10-CM

## 2012-05-30 DIAGNOSIS — R319 Hematuria, unspecified: Secondary | ICD-10-CM

## 2012-05-30 DIAGNOSIS — Z1151 Encounter for screening for human papillomavirus (HPV): Secondary | ICD-10-CM | POA: Insufficient documentation

## 2012-05-30 LAB — POCT URINALYSIS DIPSTICK
Bilirubin, UA: NEGATIVE
Glucose, UA: NEGATIVE
Ketones, UA: NEGATIVE
Nitrite, UA: NEGATIVE
pH, UA: 6

## 2012-05-30 MED ORDER — SIMVASTATIN 10 MG PO TABS: 10.0000 mg | ORAL_TABLET | Freq: Every day | ORAL | Status: DC

## 2012-05-30 NOTE — Telephone Encounter (Signed)
Refill for simvastatin 10 mg #30 with 5 refills sent in to pharmacy

## 2012-05-31 ENCOUNTER — Encounter: Payer: Self-pay | Admitting: Internal Medicine

## 2012-05-31 LAB — URINE CULTURE: Organism ID, Bacteria: NO GROWTH

## 2012-05-31 LAB — WET PREP BY MOLECULAR PROBE
Candida species: NEGATIVE
Gardnerella vaginalis: NEGATIVE

## 2012-05-31 NOTE — Assessment & Plan Note (Signed)
On protonix.  Follow.  

## 2012-05-31 NOTE — Progress Notes (Signed)
Subjective:    Patient ID: Debra Hale, female    DOB: April 19, 1951, 62 y.o.   MRN: [id number]  HPI 62 year old female with past history of thyroid carcinoma s/p surgery and XRT, colon cancer s/p laparoscopic right colectomy followed by Dr Sherrlyn Hock, nephrolithiasis, anxiety, osteoporosis and breast cancer who comes in today as a work in to follow up regarding abnormal urine and to evaluate for a possible vaginal infection.   She reports still noticing some occasional abdominal discomfort.  No significant change.  No nausea or vomiting.  No significant acid reflux reported.  Bowels stable. Eating and drinking well.   No cardiac symptoms with increased activity or exertion.  Breathing stable.  Increased stress with her financial issues and also relationship issues.  Seeing Dr Lenis Noon.  On lexapro.  Denies any suicidal ideations.     Past Medical History  Diagnosis Date  . Palpitations   . Other and unspecified hyperlipidemia   . Unspecified hereditary and idiopathic peripheral neuropathy   . Personal history of malignant neoplasm of large intestine     carcinoma - cecum, s/p right laparoscopic colectomy - s/p chemotherapy and XRT  . Malignant neoplasm of breast (female), unspecified site     s/p lumpectomy  . Malignant neoplasm of thyroid gland     s/p surgery and XRT  . Anxiety   . Liver nodule     s/p negative biopsy  . Nephrolithiasis     s/p lithotripsy  . GERD (gastroesophageal reflux disease)   . Osteoporosis     Current Outpatient Prescriptions on File Prior to Visit  Medication Sig Dispense Refill  . azelastine (ASTELIN) 137 MCG/SPRAY nasal spray Place 1 spray into the nose 2 (two) times daily. Use in each nostril as directed      . Azelastine-Fluticasone (DYMISTA) 137-50 MCG/ACT SUSP Place 1 spray into the nose 2 (two) times daily.  23 g  1  . bismuth subsalicylate (PEPTO-BISMOL) 262 MG/15ML suspension Take 15 mLs by mouth every 6 (six) hours as needed.      . clonazePAM  (KLONOPIN) 1 MG tablet Take 1 mg by mouth 2 (two) times daily as needed.        Marland Kitchen escitalopram (LEXAPRO) 20 MG tablet Take 20 mg by mouth daily.        Marland Kitchen exemestane (AROMASIN) 25 MG tablet Take 25 mg by mouth daily.      Marland Kitchen gabapentin (NEURONTIN) 100 MG capsule Take 100 mg by mouth 3 (three) times daily. Take 2 tab       . hyoscyamine (LEVSIN SL) 0.125 MG SL tablet As directed      . levothyroxine (SYNTHROID) 100 MCG tablet Take 100 mcg by mouth daily.      . pantoprazole (PROTONIX) 40 MG tablet Take 1 tablet (40 mg total) by mouth daily.  30 tablet  0  . simvastatin (ZOCOR) 10 MG tablet Take 1 tablet (10 mg total) by mouth at bedtime.  30 tablet  5    Review of Systems Patient denies any headache, lightheadedness or dizziness.  Some drainage and allergy symptoms.  Do better on Dyamist.  Is expensive.  No chest pain, tightness or palpitations.  No increased shortness of breath, cough or congestion.  No nausea or vomiting.  No acute abdominal pain or change in symptoms.   No bowel change, such as diarrhea, constipation, BRBPR or melana.  No urine change.   Previous notice of blood in the urine.  She recently had a  bone density (ordered by Dr Sherrlyn Hock).  Revealed osteoporosis - Tscore -2.5 femur (neck).  On aromasin.       Objective:   Physical Exam  Filed Vitals:   05/30/12 1639  BP: 120/70  Pulse: 78  Temp: 98.2 F (21.51 C)   61 year old female in no acute distress.   HEENT:  Nares- clear.  Oropharynx - without lesions. NECK:  Supple.  Nontender.  No audible bruit.  HEART:  Appears to be regular. LUNGS:  No crackles or wheezing audible.  Respirations even and unlabored.  RADIAL PULSE:  Equal bilaterally.  ABDOMEN:  Soft, nontender.  Bowel sounds present and normal.  No audible abdominal bruit.  GU:  Normal external genitalia.  Vaginal vault without lesions.  Could not appreciate any adnexal masses or tenderness.  Cervix identified.  No lesions.  Pap performed.   EXTREMITIES:  No  increased edema present.  DP pulses palpable and equal bilaterally.              Assessment & Plan:  INCREASED PSYCHOSOCIAL STRESSORS.  On Lexapro and Clonazepam.  Seeing Dr Lenis Noon.  Discussed her home situation.  No suicidal ideations.  Follow.  Offered more counseling.  She declines further intervention at this time.  Will notify me if she feel she needs anything more.  VAGINITIS.  Wet prep sent.    HEALTH MAINTENANCE.  Pelvic/papl today.  Mammograms through Dr Silverio Lay office.  Colonoscopy 05/18/11 revealed internal hemorrhoids.  Colonoscopy was repeated 4/13.  No evidence of colitis.

## 2012-05-31 NOTE — Assessment & Plan Note (Signed)
Recent bone density as outlined above.  On aromasin.   Discussed treatment options.  Pt agreeable to reclast.  Will arrange.  Continue calcium and vitamin D.

## 2012-05-31 NOTE — Assessment & Plan Note (Signed)
Followed by Dr Pandit.  On aromasin.  Up to date with mammograms.  

## 2012-05-31 NOTE — Assessment & Plan Note (Signed)
Hematuria noted last urine.  Repeat today revealed blood.  Will send for culture.  May need referral back to Dr Evelene Croon.

## 2012-05-31 NOTE — Assessment & Plan Note (Signed)
Just had colonoscopy 4/13.  See above.  Bowels stable.

## 2012-06-01 ENCOUNTER — Other Ambulatory Visit: Payer: Self-pay | Admitting: Internal Medicine

## 2012-06-01 DIAGNOSIS — R319 Hematuria, unspecified: Secondary | ICD-10-CM

## 2012-06-01 NOTE — Progress Notes (Signed)
Order placed for follow up urinalysis 

## 2012-06-04 ENCOUNTER — Ambulatory Visit: Payer: Self-pay | Admitting: Internal Medicine

## 2012-06-20 ENCOUNTER — Other Ambulatory Visit (INDEPENDENT_AMBULATORY_CARE_PROVIDER_SITE_OTHER): Payer: Medicare Other

## 2012-06-20 DIAGNOSIS — R319 Hematuria, unspecified: Secondary | ICD-10-CM

## 2012-06-21 LAB — URINALYSIS, ROUTINE W REFLEX MICROSCOPIC
Nitrite: NEGATIVE
Specific Gravity, Urine: >=1.03 (ref 1.000–1.030)
Total Protein, Urine: 30
Urine Glucose: NEGATIVE
pH: 5.5 (ref 5.0–8.0)

## 2012-06-22 LAB — URINE CULTURE

## 2012-07-17 ENCOUNTER — Telehealth: Payer: Self-pay | Admitting: Internal Medicine

## 2012-07-17 MED ORDER — PANTOPRAZOLE SODIUM 40 MG PO TBEC: 40.0000 mg | DELAYED_RELEASE_TABLET | Freq: Two times a day (BID) | ORAL | Status: DC

## 2012-07-17 NOTE — Telephone Encounter (Signed)
Refilled protonix bid (#60 with 3 refills)

## 2012-07-27 ENCOUNTER — Encounter: Payer: Self-pay | Admitting: Internal Medicine

## 2012-07-27 ENCOUNTER — Ambulatory Visit (INDEPENDENT_AMBULATORY_CARE_PROVIDER_SITE_OTHER): Payer: Medicare Other | Admitting: Internal Medicine

## 2012-07-27 VITALS — BP 110/80 | HR 81 | Temp 98.2°F | Ht 64.0 in | Wt 146.5 lb

## 2012-07-27 DIAGNOSIS — C73 Malignant neoplasm of thyroid gland: Secondary | ICD-10-CM

## 2012-07-27 DIAGNOSIS — F411 Generalized anxiety disorder: Secondary | ICD-10-CM

## 2012-07-27 DIAGNOSIS — Z85038 Personal history of other malignant neoplasm of large intestine: Secondary | ICD-10-CM

## 2012-07-27 DIAGNOSIS — R002 Palpitations: Secondary | ICD-10-CM

## 2012-07-27 DIAGNOSIS — F419 Anxiety disorder, unspecified: Secondary | ICD-10-CM

## 2012-07-27 DIAGNOSIS — C50919 Malignant neoplasm of unspecified site of unspecified female breast: Secondary | ICD-10-CM

## 2012-07-27 DIAGNOSIS — K219 Gastro-esophageal reflux disease without esophagitis: Secondary | ICD-10-CM

## 2012-07-27 LAB — CBC WITH DIFFERENTIAL/PLATELET
Basophils Relative: 0.4 % (ref 0.0–3.0)
Eosinophils Absolute: 0.1 10*3/uL (ref 0.0–0.7)
Eosinophils Relative: 1.9 % (ref 0.0–5.0)
Hemoglobin: 13.9 g/dL (ref 12.0–15.0)
Lymphocytes Relative: 19.4 % (ref 12.0–46.0)
MCHC: 33.6 g/dL (ref 30.0–36.0)
Neutro Abs: 3.1 10*3/uL (ref 1.4–7.7)
Neutrophils Relative %: 71.8 % (ref 43.0–77.0)
RBC: 4.66 Mil/uL (ref 3.87–5.11)
WBC: 4.3 10*3/uL — ABNORMAL LOW (ref 4.5–10.5)

## 2012-07-27 LAB — BASIC METABOLIC PANEL
CO2: 30 mEq/L (ref 19–32)
GFR: 51.22 mL/min — ABNORMAL LOW (ref >60.00)
Glucose, Bld: 94 mg/dL (ref 70–99)
Potassium: 3.8 mEq/L (ref 3.5–5.1)
Sodium: 145 mEq/L (ref 135–145)

## 2012-07-30 ENCOUNTER — Telehealth: Payer: Self-pay | Admitting: Internal Medicine

## 2012-07-30 DIAGNOSIS — C73 Malignant neoplasm of thyroid gland: Secondary | ICD-10-CM

## 2012-07-30 MED ORDER — LEVOTHYROXINE SODIUM 88 MCG PO TABS: 88.0000 ug | ORAL_TABLET | Freq: Every day | ORAL | Status: DC

## 2012-07-30 NOTE — Telephone Encounter (Signed)
Pt notified of lab results and need for synthroid dose change and follow up non fasting lab to recheck thyroid.  She is coming in for lab appt 09/08/12 at 1:30.   Please put on lab schedule.  Pt aware of appt time.   She apparently left without scheduling a follow up appt with me.  Please schedule her a follow up appt with me in 2 months (30 min).  Thanks.

## 2012-08-01 ENCOUNTER — Encounter: Payer: Self-pay | Admitting: Internal Medicine

## 2012-08-01 NOTE — Progress Notes (Signed)
Subjective:    Patient ID: Debra Hale, female    DOB: [birth year], 62 y.o.   MRN: [id number]  HPI 62 year old female with past history of thyroid carcinoma s/p surgery and XRT, colon cancer s/p laparoscopic right colectomy followed by Dr Sherrlyn Hock, nephrolithiasis, anxiety, osteoporosis and breast cancer who comes in today for a scheduled follow up.  She has noticed more palpitations since Christmas.   Worse recently.  Notices more lying down and when she first starts eating.  No associated symptoms.  She denies any chest pain or tightness.  Breathing stable.  May last just a few minutes or may be intermittent for a couple of hours.  No nausea or vomiting.  No significant acid reflux reported.  Bowels are better.   Eating and drinking well.   No cardiac symptoms with increased activity or exertion.  Still with increased stress.  Feels things are some better with her living situation.  Seeing Dr Lenis Noon.  On lexapro.  Denies any suicidal ideations.     Past Medical History  Diagnosis Date  . Palpitations   . Other and unspecified hyperlipidemia   . Unspecified hereditary and idiopathic peripheral neuropathy   . Personal history of malignant neoplasm of large intestine     carcinoma - cecum, s/p right laparoscopic colectomy - s/p chemotherapy and XRT  . Malignant neoplasm of breast (female), unspecified site     s/p lumpectomy  . Malignant neoplasm of thyroid gland     s/p surgery and XRT  . Anxiety   . Liver nodule     s/p negative biopsy  . Nephrolithiasis     s/p lithotripsy  . GERD (gastroesophageal reflux disease)   . Osteoporosis     Current Outpatient Prescriptions on File Prior to Visit  Medication Sig Dispense Refill  . azelastine (ASTELIN) 137 MCG/SPRAY nasal spray Place 1 spray into the nose 2 (two) times daily. Use in each nostril as directed      . Azelastine-Fluticasone (DYMISTA) 137-50 MCG/ACT SUSP Place 1 spray into the nose 2 (two) times daily.  23 g  1  . bismuth  subsalicylate (PEPTO-BISMOL) 262 MG/15ML suspension Take 15 mLs by mouth every 6 (six) hours as needed.      . clonazePAM (KLONOPIN) 1 MG tablet Take 1 mg by mouth 2 (two) times daily as needed.        Marland Kitchen escitalopram (LEXAPRO) 20 MG tablet Take 20 mg by mouth daily.        Marland Kitchen exemestane (AROMASIN) 25 MG tablet Take 25 mg by mouth daily.      . hyoscyamine (LEVSIN SL) 0.125 MG SL tablet As directed      . pantoprazole (PROTONIX) 40 MG tablet Take 1 tablet (40 mg total) by mouth 2 (two) times daily.  60 tablet  3  . simvastatin (ZOCOR) 10 MG tablet Take 1 tablet (10 mg total) by mouth at bedtime.  30 tablet  5  . gabapentin (NEURONTIN) 100 MG capsule Take 100 mg by mouth 3 (three) times daily. Take 2 tab        No current facility-administered medications on file prior to visit.    Review of Systems Patient denies any headache, lightheadedness or dizziness.  No significant drainage and allergy symptoms.  Do better on Dyamist.  No chest pain, tightness.  Does report the intermittent palpitations as outlined.  See above.  No increased shortness of breath, cough or congestion.  No nausea or vomiting.  No acute abdominal  pain or change in symptoms.   No bowel change, such as diarrhea, constipation, BRBPR or melana.  Bowels better.  No urine change.   She recently had a bone density (ordered by Dr Sherrlyn Hock).  Revealed osteoporosis - Tscore -2.5 femur (neck).  On aromasin.       Objective:   Physical Exam  Filed Vitals:   07/27/12 1126  BP: 110/80  Pulse: 81  Temp: 98.2 F (97.16 C)   63 year old female in no acute distress.   HEENT:  Nares- clear.  Oropharynx - without lesions. NECK:  Supple.  Nontender.  No audible bruit.  HEART:  Appears to be regular. LUNGS:  No crackles or wheezing audible.  Respirations even and unlabored.  RADIAL PULSE:  Equal bilaterally.  ABDOMEN:  Soft, nontender.  Bowel sounds present and normal.  No audible abdominal bruit.   EXTREMITIES:  No increased edema present.   DP pulses palpable and equal bilaterally.              Assessment & Plan:  INCREASED PSYCHOSOCIAL STRESSORS.  On Lexapro and Clonazepam.  Seeing Dr Lenis Noon.    No suicidal ideations.   Will notify me if she feel she needs anything more.  She reports things are better .  HEALTH MAINTENANCE.  Pelvic/papl last visit.   Mammograms through Dr Silverio Lay office.  Colonoscopy 05/18/11 revealed internal hemorrhoids.  Colonoscopy was repeated 4/13.  No evidence of colitis.

## 2012-08-01 NOTE — Assessment & Plan Note (Signed)
Symptoms appear to be controlled on current regimen.  Follow.   

## 2012-08-01 NOTE — Assessment & Plan Note (Signed)
On synthroid.  Had recent quantitative thyroglobulin and anti-thyroglobulin antibody.  Have kept her tsh suppressed.  Check tsh. May have to adjust medication some.  Could be contributing to her palpitations.  Follow.

## 2012-08-01 NOTE — Assessment & Plan Note (Signed)
Just had colonoscopy 4/13.  Bowels better.   

## 2012-08-01 NOTE — Telephone Encounter (Signed)
Pt aware of appointment lab 5/8   Follow up 6/2

## 2012-08-01 NOTE — Assessment & Plan Note (Signed)
Followed by Dr Pandit.  On aromasin.  Up to date with mammograms.  

## 2012-08-01 NOTE — Assessment & Plan Note (Signed)
Stable on current regimen.  Follow.  Seeing Dr Levine.  

## 2012-08-01 NOTE — Assessment & Plan Note (Signed)
Worsened recently.  Will check tsh.  EKG obtained and revealed with no acute ischemic changes.  Discussed further cardiac w/up with her including ECHO, Holter, etc.  She declines at this time.  Will check tsh.  Wants to monitor and see what the lab results reveal before pursuing any further cardiac w/up.  Will follow.  She is to call or be reevaluated if symptoms change or worsen.

## 2012-08-09 ENCOUNTER — Ambulatory Visit: Payer: Self-pay | Admitting: Internal Medicine

## 2012-08-11 NOTE — Telephone Encounter (Signed)
Please close encounter

## 2012-09-08 ENCOUNTER — Other Ambulatory Visit (INDEPENDENT_AMBULATORY_CARE_PROVIDER_SITE_OTHER): Payer: Medicare Other

## 2012-09-08 DIAGNOSIS — C73 Malignant neoplasm of thyroid gland: Secondary | ICD-10-CM

## 2012-09-09 ENCOUNTER — Encounter: Payer: Self-pay | Admitting: *Deleted

## 2012-10-03 ENCOUNTER — Telehealth: Payer: Self-pay | Admitting: Internal Medicine

## 2012-10-03 ENCOUNTER — Ambulatory Visit (INDEPENDENT_AMBULATORY_CARE_PROVIDER_SITE_OTHER): Payer: Medicare Other | Admitting: Internal Medicine

## 2012-10-03 ENCOUNTER — Encounter: Payer: Self-pay | Admitting: Internal Medicine

## 2012-10-03 VITALS — BP 120/80 | HR 81 | Temp 98.6°F | Ht 64.0 in | Wt 151.0 lb

## 2012-10-03 DIAGNOSIS — R319 Hematuria, unspecified: Secondary | ICD-10-CM

## 2012-10-03 DIAGNOSIS — C73 Malignant neoplasm of thyroid gland: Secondary | ICD-10-CM

## 2012-10-03 DIAGNOSIS — F419 Anxiety disorder, unspecified: Secondary | ICD-10-CM

## 2012-10-03 DIAGNOSIS — F411 Generalized anxiety disorder: Secondary | ICD-10-CM

## 2012-10-03 DIAGNOSIS — Z85038 Personal history of other malignant neoplasm of large intestine: Secondary | ICD-10-CM

## 2012-10-03 DIAGNOSIS — M810 Age-related osteoporosis without current pathological fracture: Secondary | ICD-10-CM

## 2012-10-03 DIAGNOSIS — K219 Gastro-esophageal reflux disease without esophagitis: Secondary | ICD-10-CM

## 2012-10-03 DIAGNOSIS — E7800 Pure hypercholesterolemia, unspecified: Secondary | ICD-10-CM

## 2012-10-03 DIAGNOSIS — C50919 Malignant neoplasm of unspecified site of unspecified female breast: Secondary | ICD-10-CM

## 2012-10-03 DIAGNOSIS — E785 Hyperlipidemia, unspecified: Secondary | ICD-10-CM

## 2012-10-03 DIAGNOSIS — R002 Palpitations: Secondary | ICD-10-CM

## 2012-10-03 NOTE — Telephone Encounter (Signed)
Please schedule fasting labs a few days before her 8/14 appt.  Thanks.

## 2012-10-03 NOTE — Assessment & Plan Note (Signed)
Have improved with adjustment of her synthroid.  Still some intermittent palpitations.  Feels overalll stable.  Follow.

## 2012-10-03 NOTE — Assessment & Plan Note (Signed)
Stable on current regimen.  Follow.  Seeing Dr Levine.  

## 2012-10-03 NOTE — Progress Notes (Signed)
Subjective:    Patient ID: Debra Hale, female    DOB: 10-03-1950, 62 y.o.   MRN: [id number]  Headache   62 year old female with past history of thyroid carcinoma s/p surgery and XRT, colon cancer s/p laparoscopic right colectomy followed by Dr Sherrlyn Hock, nephrolithiasis, anxiety, osteoporosis and breast cancer who comes in today for a scheduled follow up.  She had noticed more palpitations (last visit).  We adjusted her thyroid medication.  Palpitations have improved.  Will still notice some palpitations intermittently.  Does not occur when she is active and exerting herself.   No associated symptoms.  She denies any chest pain or tightness.  Breathing stable.  No nausea or vomiting.  No significant acid reflux reported.  Bowels are better.   Eating and drinking well.   No cardiac symptoms with increased activity or exertion.  Still with increased stress.  Seeing Dr Lenis Noon.  On lexapro.  She does report that she has had issues with sinus pressure and head is stopped up.  Has been present for six weeks.  Went to acute care.  Has been taking ceftin, nasonex, advil and mucinex.  Symptoms have improved, but still present.  Previously had swelling in her glands.  Tender.  Better.     Past Medical History  Diagnosis Date  . Palpitations   . Other and unspecified hyperlipidemia   . Unspecified hereditary and idiopathic peripheral neuropathy   . Personal history of malignant neoplasm of large intestine     carcinoma - cecum, s/p right laparoscopic colectomy - s/p chemotherapy and XRT  . Malignant neoplasm of breast (female), unspecified site     s/p lumpectomy  . Malignant neoplasm of thyroid gland     s/p surgery and XRT  . Anxiety   . Liver nodule     s/p negative biopsy  . Nephrolithiasis     s/p lithotripsy  . GERD (gastroesophageal reflux disease)   . Osteoporosis     Current Outpatient Prescriptions on File Prior to Visit  Medication Sig Dispense Refill  . bismuth subsalicylate  (PEPTO-BISMOL) 262 MG/15ML suspension Take 15 mLs by mouth every 6 (six) hours as needed.      . clonazePAM (KLONOPIN) 1 MG tablet Take 1 mg by mouth 2 (two) times daily as needed.        Marland Kitchen escitalopram (LEXAPRO) 20 MG tablet Take 20 mg by mouth daily.        Marland Kitchen exemestane (AROMASIN) 25 MG tablet Take 25 mg by mouth daily.      Marland Kitchen gabapentin (NEURONTIN) 100 MG capsule Take 100 mg by mouth 3 (three) times daily. Take 2 tab       . hyoscyamine (LEVSIN SL) 0.125 MG SL tablet As directed      . levothyroxine (SYNTHROID) 88 MCG tablet Take 1 tablet (88 mcg total) by mouth daily.  30 tablet  2  . pantoprazole (PROTONIX) 40 MG tablet Take 1 tablet (40 mg total) by mouth 2 (two) times daily.  60 tablet  3  . simvastatin (ZOCOR) 10 MG tablet Take 1 tablet (10 mg total) by mouth at bedtime.  30 tablet  5  . azelastine (ASTELIN) 137 MCG/SPRAY nasal spray Place 1 spray into the nose 2 (two) times daily. Use in each nostril as directed      . Azelastine-Fluticasone (DYMISTA) 137-50 MCG/ACT SUSP Place 1 spray into the nose 2 (two) times daily.  23 g  1   No current facility-administered medications on file  prior to visit.    Review of Systems  Neurological: Positive for headaches.  Patient denies lightheadedness or dizziness.  Does report the sinus pressure as outlined.  Nose feels stopped up.  Better on the current med regimen.  Still with persistent symptoms.  No chest pain, tightness.  Does report the intermittent palpitations as outlined.  See above.  No increased shortness of breath, cough or congestion.  No nausea or vomiting.  No abdominal pain or change in symptoms.   No bowel change, such as diarrhea, constipation, BRBPR or melana.  Bowels better.  No urine change.  Increased stress.  Feels she is handling things relatively well.       Objective:   Physical Exam  Filed Vitals:   10/03/12 1509  BP: 120/80  Pulse: 81  Temp: 98.6 F (36 C)   61 year old female in no acute distress.   HEENT:   Nares- clear.  Oropharynx - without lesions. NECK:  Supple.  Nontender.  No audible bruit.  HEART:  Appears to be regular. LUNGS:  No crackles or wheezing audible.  Respirations even and unlabored.  RADIAL PULSE:  Equal bilaterally.  ABDOMEN:  Soft, nontender.  Bowel sounds present and normal.  No audible abdominal bruit.   EXTREMITIES:  No increased edema present.  DP pulses palpable and equal bilaterally.              Assessment & Plan:  INCREASED PSYCHOSOCIAL STRESSORS.  On Lexapro and Clonazepam.  Seeing Dr Lenis Noon.    No suicidal ideations.   Will notify me if she feel she needs anything more.  HEALTH MAINTENANCE.  Pap 05/30/12 - ok.   Mammograms through Dr Silverio Lay office.  Colonoscopy 05/18/11 revealed internal hemorrhoids.  Colonoscopy was repeated 4/13.  No evidence of colitis.

## 2012-10-03 NOTE — Assessment & Plan Note (Signed)
Recent bone density as outlined above.  On aromasin.   Discussed treatment options.  Pt agreeable to reclast.  Will arrange.  Make sure she is receiving.  Continue calcium and vitamin D.

## 2012-10-03 NOTE — Assessment & Plan Note (Signed)
On synthroid.  Had recent quantitative thyroglobulin and anti-thyroglobulin antibody.  Have kept her tsh suppressed previously.  She was having increased palpitations.  See last note.  Adjusted synthroid.  Follow.

## 2012-10-03 NOTE — Assessment & Plan Note (Signed)
Low cholesterol diet and exercise.  Follow lipid panel and liver function.  On simvastatin.   

## 2012-10-03 NOTE — Assessment & Plan Note (Signed)
Symptoms appear to be controlled on current regimen.  Follow.   

## 2012-10-03 NOTE — Assessment & Plan Note (Signed)
Just had colonoscopy 4/13.  Bowels better.   

## 2012-10-03 NOTE — Assessment & Plan Note (Addendum)
Was referred to Dr Evelene Croon for further w/up.  Last urinalysis 3-6 rbc's.  Will repeat with next labs.

## 2012-10-03 NOTE — Assessment & Plan Note (Signed)
Followed by Dr Pandit.  On aromasin.  Up to date with mammograms.  

## 2012-10-05 NOTE — Telephone Encounter (Signed)
Tried calling pt with appointmetn mail box is full

## 2012-10-06 NOTE — Telephone Encounter (Signed)
Mailed appointment to pt 10/06/12

## 2012-10-10 ENCOUNTER — Telehealth: Payer: Self-pay | Admitting: *Deleted

## 2012-10-10 MED ORDER — TRIAMCINOLONE ACETONIDE(NASAL) 55 MCG/ACT NA INHA: 2.0000 | Freq: Every day | NASAL | Status: DC

## 2012-10-10 NOTE — Telephone Encounter (Signed)
Patient requesting script for [facility] pharmacy advised patient it would be cheaper with script please advise.

## 2012-10-10 NOTE — Telephone Encounter (Signed)
Sent rx for nasacort to her [facility]

## 2012-11-15 ENCOUNTER — Other Ambulatory Visit: Payer: Self-pay | Admitting: *Deleted

## 2012-11-15 MED ORDER — SIMVASTATIN 10 MG PO TABS: 10.0000 mg | ORAL_TABLET | Freq: Every day | ORAL | Status: DC

## 2012-12-02 ENCOUNTER — Other Ambulatory Visit (INDEPENDENT_AMBULATORY_CARE_PROVIDER_SITE_OTHER): Payer: Medicare Other

## 2012-12-02 DIAGNOSIS — R319 Hematuria, unspecified: Secondary | ICD-10-CM

## 2012-12-02 DIAGNOSIS — E7800 Pure hypercholesterolemia, unspecified: Secondary | ICD-10-CM

## 2012-12-02 DIAGNOSIS — C73 Malignant neoplasm of thyroid gland: Secondary | ICD-10-CM

## 2012-12-02 LAB — HEPATIC FUNCTION PANEL
ALT: 42 U/L — ABNORMAL HIGH (ref 0–35)
AST: 21 U/L (ref 0–37)
Albumin: 3.8 g/dL (ref 3.5–5.2)

## 2012-12-02 LAB — LIPID PANEL: Cholesterol: 158 mg/dL (ref 0–200)

## 2012-12-06 ENCOUNTER — Encounter: Payer: Self-pay | Admitting: Internal Medicine

## 2012-12-06 ENCOUNTER — Ambulatory Visit (INDEPENDENT_AMBULATORY_CARE_PROVIDER_SITE_OTHER): Payer: Medicare Other | Admitting: Internal Medicine

## 2012-12-06 ENCOUNTER — Other Ambulatory Visit: Payer: Self-pay | Admitting: Internal Medicine

## 2012-12-06 VITALS — BP 120/70 | HR 86 | Temp 98.7°F | Ht 64.0 in | Wt 155.0 lb

## 2012-12-06 DIAGNOSIS — R7989 Other specified abnormal findings of blood chemistry: Secondary | ICD-10-CM

## 2012-12-06 DIAGNOSIS — C50919 Malignant neoplasm of unspecified site of unspecified female breast: Secondary | ICD-10-CM

## 2012-12-06 DIAGNOSIS — F411 Generalized anxiety disorder: Secondary | ICD-10-CM

## 2012-12-06 DIAGNOSIS — N390 Urinary tract infection, site not specified: Secondary | ICD-10-CM

## 2012-12-06 DIAGNOSIS — K219 Gastro-esophageal reflux disease without esophagitis: Secondary | ICD-10-CM

## 2012-12-06 DIAGNOSIS — Z85038 Personal history of other malignant neoplasm of large intestine: Secondary | ICD-10-CM

## 2012-12-06 DIAGNOSIS — R319 Hematuria, unspecified: Secondary | ICD-10-CM

## 2012-12-06 DIAGNOSIS — M810 Age-related osteoporosis without current pathological fracture: Secondary | ICD-10-CM

## 2012-12-06 DIAGNOSIS — C73 Malignant neoplasm of thyroid gland: Secondary | ICD-10-CM

## 2012-12-06 DIAGNOSIS — E785 Hyperlipidemia, unspecified: Secondary | ICD-10-CM

## 2012-12-06 DIAGNOSIS — F419 Anxiety disorder, unspecified: Secondary | ICD-10-CM

## 2012-12-06 DIAGNOSIS — R945 Abnormal results of liver function studies: Secondary | ICD-10-CM

## 2012-12-06 DIAGNOSIS — N760 Acute vaginitis: Secondary | ICD-10-CM

## 2012-12-06 DIAGNOSIS — R002 Palpitations: Secondary | ICD-10-CM

## 2012-12-06 LAB — POCT URINALYSIS DIPSTICK
Protein, UA: 100
Urobilinogen, UA: 0.2

## 2012-12-06 MED ORDER — LEVOTHYROXINE SODIUM 100 MCG PO TABS: 100.0000 ug | ORAL_TABLET | Freq: Every day | ORAL | Status: DC

## 2012-12-07 ENCOUNTER — Telehealth: Payer: Self-pay | Admitting: Internal Medicine

## 2012-12-07 ENCOUNTER — Encounter: Payer: Self-pay | Admitting: Internal Medicine

## 2012-12-07 DIAGNOSIS — R945 Abnormal results of liver function studies: Secondary | ICD-10-CM | POA: Insufficient documentation

## 2012-12-07 DIAGNOSIS — R7989 Other specified abnormal findings of blood chemistry: Secondary | ICD-10-CM | POA: Insufficient documentation

## 2012-12-07 NOTE — Assessment & Plan Note (Signed)
Stable on current regimen.  Follow.  Seeing Dr Levine.  

## 2012-12-07 NOTE — Assessment & Plan Note (Signed)
On synthroid.  Had recent quantitative thyroglobulin and anti-thyroglobulin antibody.  Have kept her tsh suppressed previously.  She was having increased palpitations.  Adjusted synthroid.  TSH just checked and within the normal range, but would like to keep a little more suppressed.  Will adjust synthroid to (1/2 tab) one day per week and then continue on q day all other days.  Recheck tsh in 6 weeks.

## 2012-12-07 NOTE — Assessment & Plan Note (Signed)
Have resolved.  Follow.    

## 2012-12-07 NOTE — Assessment & Plan Note (Signed)
Followed by Dr Pandit.  On aromasin.  Up to date with mammograms.  

## 2012-12-07 NOTE — Assessment & Plan Note (Signed)
ALT slightly elevated.  Recheck liver panel.

## 2012-12-07 NOTE — Assessment & Plan Note (Addendum)
Low cholesterol diet and exercise.  Follow lipid panel and liver function.  On simvastatin.  Recent cholesterol check revealed a total cholesterol 158, triglycerides 135, HDL 39 and LDL 92.

## 2012-12-07 NOTE — Assessment & Plan Note (Signed)
Just had colonoscopy 4/13.  Bowels better.   

## 2012-12-07 NOTE — Progress Notes (Signed)
Subjective:    Patient ID: Debra Hale, female    DOB: Jan 21, 1951, 62 y.o.   MRN: [id number]  HPI 62 year old female with past history of thyroid carcinoma s/p surgery and XRT, colon cancer s/p laparoscopic right colectomy followed by Dr Sherrlyn Hock, nephrolithiasis, anxiety, osteoporosis and breast cancer who comes in today for a scheduled follow up.  She states overall she is doing relatively well. We had previously adjusted her thyroid medication and since, she has not noticed any palpitations.  Breathing stable.  Dymista works well for her.  Eating and drinking well.  No acid reflux.  She is concerned over a possible urinary tract infection or vaginal infection.  Some irritation.  No bleeding.  No abdominal pain or cramping.  Bowels stable.    Past Medical History  Diagnosis Date  . Palpitations   . Other and unspecified hyperlipidemia   . Unspecified hereditary and idiopathic peripheral neuropathy   . Personal history of malignant neoplasm of large intestine     carcinoma - cecum, s/p right laparoscopic colectomy - s/p chemotherapy and XRT  . Malignant neoplasm of breast (female), unspecified site     s/p lumpectomy  . Malignant neoplasm of thyroid gland     s/p surgery and XRT  . Anxiety   . Liver nodule     s/p negative biopsy  . Nephrolithiasis     s/p lithotripsy  . GERD (gastroesophageal reflux disease)   . Osteoporosis     Current Outpatient Prescriptions on File Prior to Visit  Medication Sig Dispense Refill  . bismuth subsalicylate (PEPTO-BISMOL) 262 MG/15ML suspension Take 15 mLs by mouth every 6 (six) hours as needed.      . clonazePAM (KLONOPIN) 1 MG tablet Take 1 mg by mouth 2 (two) times daily as needed.        Debra Hale Kitchen escitalopram (LEXAPRO) 20 MG tablet Take 20 mg by mouth daily.        Debra Hale Kitchen exemestane (AROMASIN) 25 MG tablet Take 25 mg by mouth daily.      Debra Hale Kitchen gabapentin (NEURONTIN) 100 MG capsule Take 100 mg by mouth 3 (three) times daily. Take 2 tab       . hyoscyamine  (LEVSIN SL) 0.125 MG SL tablet As directed      . ibuprofen (ADVIL,MOTRIN) 100 MG tablet Take 100 mg by mouth every 6 (six) hours as needed for fever.      . pantoprazole (PROTONIX) 40 MG tablet Take 1 tablet (40 mg total) by mouth 2 (two) times daily.  60 tablet  3  . simvastatin (ZOCOR) 10 MG tablet Take 1 tablet (10 mg total) by mouth at bedtime.  30 tablet  5  . triamcinolone (NASACORT AQ) 55 MCG/ACT nasal inhaler Place 2 sprays into the nose daily.  1 Inhaler  4   No current facility-administered medications on file prior to visit.    Review of Systems Patient denies any headache, lightheadedness or dizziness.  No significant drainage and allergy symptoms.  Doing better on Dyamista.  No chest pain, tightness.  No palpitations.  No increased shortness of breath, cough or congestion.  No nausea or vomiting.  No acute abdominal pain or change in symptoms.   No bowel change, such as diarrhea, constipation, BRBPR or melana.  Bowels better.  Concern regarding a possible urinary tract infection of vaginal infection.       Objective:   Physical Exam  Filed Vitals:   12/06/12 1615  BP: 120/70  Pulse: 86  Temp: 98.7 F (82.28 C)   62 year old female in no acute distress.   HEENT:  Nares- clear.  Oropharynx - without lesions. NECK:  Supple.  Nontender.  No audible bruit.  HEART:  Appears to be regular. LUNGS:  No crackles or wheezing audible.  Respirations even and unlabored.  RADIAL PULSE:  Equal bilaterally.  ABDOMEN:  Soft, nontender.  Bowel sounds present and normal.  No audible abdominal bruit.  GU:  Normal external genitalia.  Vaginal vault without lesions.  KOH/Wet prep sent.  Could not appreciate any adnexal masses or tenderness.   EXTREMITIES:  No increased edema present.  DP pulses palpable and equal bilaterally.             Assessment & Plan:  INCREASED PSYCHOSOCIAL STRESSORS.  On Lexapro and Clonazepam.  Seeing Dr Lenis Noon.    Overall appears to be doing better.   Follow.  POSSIBLE URINARY TRACT INFECTION.  Urine dip with large blood and positive leukocytes.  Will send for culture.  Hold on abx at this time.    POSSIBLE VAGINAL INFECTION.  Wet prep sent.     HEALTH MAINTENANCE.  Pap negative 05/30/12.   Mammograms through Dr Silverio Lay office.  Colonoscopy 05/18/11 revealed internal hemorrhoids.  Colonoscopy was repeated 4/13.  No evidence of colitis.

## 2012-12-07 NOTE — Assessment & Plan Note (Signed)
Was referred to Dr Evelene Croon for further w/up.  Last urinalysis 3-6 rbc's.  Repeated to day.  Will confirm no infection.  Will need referral back to urology if persistent hematuria.

## 2012-12-07 NOTE — Assessment & Plan Note (Signed)
Recent bone density as outlined above.  On aromasin.   Discussed treatment options.  Pt was agreeable to reclast.  Need to make sure she is receiving.  Continue calcium and vitamin D.

## 2012-12-07 NOTE — Telephone Encounter (Signed)
Please schedule pt for a follow up appt in 2 months (30 min).  Schedule a non fasting lab appt in 1-2 weeks.  Thanks.

## 2012-12-07 NOTE — Assessment & Plan Note (Signed)
Symptoms appear to be controlled on current regimen.  Follow.   

## 2012-12-08 LAB — URINE CULTURE: Colony Count: >=100000

## 2012-12-09 ENCOUNTER — Telehealth: Payer: Self-pay | Admitting: Internal Medicine

## 2012-12-09 LAB — WET PREP BY MOLECULAR PROBE
Candida species: NEGATIVE
Gardnerella vaginalis: NEGATIVE
Trichomonas vaginosis: NEGATIVE

## 2012-12-09 MED ORDER — AMOXICILLIN 875 MG PO TABS: 875.0000 mg | ORAL_TABLET | Freq: Two times a day (BID) | ORAL | Status: AC

## 2012-12-09 NOTE — Telephone Encounter (Signed)
Pt wanted to know to when she was in on Tuesday did she have uti.  She stated she is having pressure and burning when urinating

## 2012-12-09 NOTE — Telephone Encounter (Signed)
Pt notified of positive urine culture & Rx sent to [facility]

## 2012-12-09 NOTE — Telephone Encounter (Signed)
Pt aware of appointment lab 8/18  Office visit 10/20

## 2012-12-19 ENCOUNTER — Other Ambulatory Visit (INDEPENDENT_AMBULATORY_CARE_PROVIDER_SITE_OTHER): Payer: Medicare Other

## 2012-12-19 DIAGNOSIS — R7989 Other specified abnormal findings of blood chemistry: Secondary | ICD-10-CM

## 2012-12-19 DIAGNOSIS — R945 Abnormal results of liver function studies: Secondary | ICD-10-CM

## 2012-12-19 LAB — HEPATIC FUNCTION PANEL
Alkaline Phosphatase: 79 U/L (ref 39–117)
Bilirubin, Direct: 0.1 mg/dL (ref 0.0–0.3)
Total Bilirubin: 0.9 mg/dL (ref 0.3–1.2)
Total Protein: 7.2 g/dL (ref 6.0–8.3)

## 2012-12-20 ENCOUNTER — Telehealth: Payer: Self-pay | Admitting: Internal Medicine

## 2012-12-20 DIAGNOSIS — R7989 Other specified abnormal findings of blood chemistry: Secondary | ICD-10-CM

## 2012-12-20 DIAGNOSIS — R945 Abnormal results of liver function studies: Secondary | ICD-10-CM

## 2012-12-20 NOTE — Progress Notes (Signed)
Opened in error

## 2012-12-20 NOTE — Telephone Encounter (Signed)
Order  Placed for abdominal ultrasound.

## 2012-12-20 NOTE — Telephone Encounter (Signed)
Message copied by Charm Barges on Tue Dec 20, 2012  1:39 PM ------      Message from: Warden Fillers      Created: Tue Dec 20, 2012  1:22 PM       Pt notified & would like to proceed with the abdominal ultrasound ------

## 2012-12-22 ENCOUNTER — Ambulatory Visit: Payer: Self-pay | Admitting: Internal Medicine

## 2012-12-27 ENCOUNTER — Other Ambulatory Visit: Payer: Self-pay | Admitting: Internal Medicine

## 2012-12-27 DIAGNOSIS — R945 Abnormal results of liver function studies: Secondary | ICD-10-CM

## 2012-12-27 DIAGNOSIS — R7989 Other specified abnormal findings of blood chemistry: Secondary | ICD-10-CM

## 2012-12-27 NOTE — Progress Notes (Signed)
Order placed for f/u liver panel.  

## 2013-01-02 ENCOUNTER — Ambulatory Visit: Payer: Self-pay | Admitting: Internal Medicine

## 2013-01-04 ENCOUNTER — Ambulatory Visit: Payer: Medicare Other | Admitting: Adult Health

## 2013-01-04 ENCOUNTER — Encounter: Payer: Self-pay | Admitting: Internal Medicine

## 2013-01-04 ENCOUNTER — Ambulatory Visit (INDEPENDENT_AMBULATORY_CARE_PROVIDER_SITE_OTHER): Payer: Medicare Other | Admitting: Internal Medicine

## 2013-01-04 ENCOUNTER — Ambulatory Visit: Payer: Self-pay | Admitting: Internal Medicine

## 2013-01-04 ENCOUNTER — Other Ambulatory Visit: Payer: Self-pay | Admitting: *Deleted

## 2013-01-04 VITALS — BP 110/80 | HR 72 | Temp 98.5°F | Ht 64.0 in | Wt 151.5 lb

## 2013-01-04 DIAGNOSIS — R51 Headache: Secondary | ICD-10-CM

## 2013-01-04 DIAGNOSIS — F411 Generalized anxiety disorder: Secondary | ICD-10-CM

## 2013-01-04 DIAGNOSIS — C50919 Malignant neoplasm of unspecified site of unspecified female breast: Secondary | ICD-10-CM

## 2013-01-04 DIAGNOSIS — K219 Gastro-esophageal reflux disease without esophagitis: Secondary | ICD-10-CM

## 2013-01-04 DIAGNOSIS — Z85038 Personal history of other malignant neoplasm of large intestine: Secondary | ICD-10-CM

## 2013-01-04 DIAGNOSIS — R7989 Other specified abnormal findings of blood chemistry: Secondary | ICD-10-CM

## 2013-01-04 DIAGNOSIS — G609 Hereditary and idiopathic neuropathy, unspecified: Secondary | ICD-10-CM

## 2013-01-04 DIAGNOSIS — F419 Anxiety disorder, unspecified: Secondary | ICD-10-CM

## 2013-01-04 DIAGNOSIS — C73 Malignant neoplasm of thyroid gland: Secondary | ICD-10-CM

## 2013-01-04 DIAGNOSIS — R945 Abnormal results of liver function studies: Secondary | ICD-10-CM

## 2013-01-04 DIAGNOSIS — E785 Hyperlipidemia, unspecified: Secondary | ICD-10-CM

## 2013-01-04 LAB — CREATININE, SERUM: EGFR (Non-African Amer.): 53 — ABNORMAL LOW

## 2013-01-04 MED ORDER — CEFUROXIME AXETIL 250 MG PO TABS: 250.0000 mg | ORAL_TABLET | Freq: Two times a day (BID) | ORAL | Status: DC

## 2013-01-04 MED ORDER — AMOXICILLIN-POT CLAVULANATE 875-125 MG PO TABS: 1.0000 | ORAL_TABLET | Freq: Two times a day (BID) | ORAL | Status: DC

## 2013-01-05 ENCOUNTER — Encounter: Payer: Self-pay | Admitting: *Deleted

## 2013-01-05 ENCOUNTER — Encounter: Payer: Self-pay | Admitting: Internal Medicine

## 2013-01-05 NOTE — Assessment & Plan Note (Signed)
Has had some tingling since chemo treatment.  This acute tingling and throbbing "different".  W/up planned as outlined.

## 2013-01-05 NOTE — Assessment & Plan Note (Signed)
Low cholesterol diet and exercise.  Follow lipid panel and liver function.  On simvastatin.  Recent cholesterol check revealed a total cholesterol 158, triglycerides 135, HDL 39 and LDL 92.       

## 2013-01-05 NOTE — Progress Notes (Signed)
Subjective:    Patient ID: Debra Hale, female    DOB: 1951/02/16, 62 y.o.   MRN: [id number]  Headache   62 year old female with past history of thyroid carcinoma s/p surgery and XRT, colon cancer s/p laparoscopic right colectomy followed by Dr Sherrlyn Hock, nephrolithiasis, anxiety, osteoporosis and breast cancer who comes in today as a work in with concerns regarding headache.  She had a headache approximately one month ago.  Was treated with abx and headache seemed to improve.  She reports that the headache worsened again two weeks ago.  States she feels better if she keeps her eyes closed.  Also describes increased nasal pressure.  Having some thick mucus and increased drainage.  Some ear fullness and popping.  States the headache feels similar to her previous sinus headaches.  No fever.  No nausea or vomiting.  Started nasonex and mucinex a few days ago.  States that after taking the mucinex, she noticed some right hand and arm tingling.  Also, described a throbbing in the right had and arm.  The symptoms started in her hand and extended up her arm.  Also noticed her right toes and foot had similar sensation.  She also felt tingling - right side of her tongue.  The episode lasted approximately 30-45 minutes.  Called EMS.  Was evaluated.  They stated everything checked out fine.  Was not taken to ER for evaluation.  She states she had another (more mild) episode yesterday.  Again, involved the right hand, right arm, right toes and right tongue.  Has not had any other episodes or sensation changes since.  No residual problems.     Past Medical History  Diagnosis Date  . Palpitations   . Other and unspecified hyperlipidemia   . Unspecified hereditary and idiopathic peripheral neuropathy   . Personal history of malignant neoplasm of large intestine     carcinoma - cecum, s/p right laparoscopic colectomy - s/p chemotherapy and XRT  . Malignant neoplasm of breast (female), unspecified site     s/p  lumpectomy  . Malignant neoplasm of thyroid gland     s/p surgery and XRT  . Anxiety   . Liver nodule     s/p negative biopsy  . Nephrolithiasis     s/p lithotripsy  . GERD (gastroesophageal reflux disease)   . Osteoporosis     Current Outpatient Prescriptions on File Prior to Visit  Medication Sig Dispense Refill  . bismuth subsalicylate (PEPTO-BISMOL) 262 MG/15ML suspension Take 15 mLs by mouth every 6 (six) hours as needed.      . clonazePAM (KLONOPIN) 1 MG tablet Take 1 mg by mouth 2 (two) times daily as needed.        Marland Kitchen escitalopram (LEXAPRO) 20 MG tablet Take 20 mg by mouth daily.        Marland Kitchen exemestane (AROMASIN) 25 MG tablet Take 25 mg by mouth daily.      Marland Kitchen gabapentin (NEURONTIN) 100 MG capsule Take 100 mg by mouth 3 (three) times daily. Take 2 tab       . ibuprofen (ADVIL,MOTRIN) 100 MG tablet Take 100 mg by mouth every 6 (six) hours as needed for fever.      . levothyroxine (SYNTHROID) 100 MCG tablet Take 1 tablet (100 mcg total) by mouth daily before breakfast.  90 tablet  3  . pantoprazole (PROTONIX) 40 MG tablet Take 1 tablet (40 mg total) by mouth 2 (two) times daily.  60 tablet  3  .  simvastatin (ZOCOR) 10 MG tablet Take 1 tablet (10 mg total) by mouth at bedtime.  30 tablet  5  . triamcinolone (NASACORT AQ) 55 MCG/ACT nasal inhaler Place 2 sprays into the nose daily.  1 Inhaler  4  . hyoscyamine (LEVSIN SL) 0.125 MG SL tablet As directed       No current facility-administered medications on file prior to visit.    Review of Systems  Neurological: Positive for headaches.  Patient denies any lightheadedness or dizziness.  Headaches as outlined.  Increased congestion and drainage as outlined.  See above.   No chest pain, tightness.  No palpitations.  No increased shortness of breath, cough or congestion.  No nausea or vomiting.  No acute abdominal pain or change in symptoms.   No bowel change, such as diarrhea, constipation, BRBPR or melana.  Bowels better.  Describes the  tingling as outlined.  See above.  No fever.        Objective:   Physical Exam  Filed Vitals:   01/04/13 1233  BP: 110/80  Pulse: 72  Temp: 98.5 F (45.44 C)   62 year old female in no acute distress.   HEENT:  Nares- slightly erythematous turbinates.  Oropharynx - without lesions.  TMs visualized without erythema.  Minimal tenderness to palpation over the maxillary sinus.   NECK:  Supple.  Nontender.  No audible bruit.  HEART:  Appears to be regular. LUNGS:  No crackles or wheezing audible.  Respirations even and unlabored.  RADIAL PULSE:  Equal bilaterally.  ABDOMEN:  Soft, nontender.  Bowel sounds present and normal.  No audible abdominal bruit.  EXTREMITIES:  No increased edema present.  DP pulses palpable and equal bilaterally.      NEURO:  No focal neuro deficits noted on exam.  Grip strength wnl.          Assessment & Plan:  NEURO.  Has had two episodes of right hand, arm, foot tingling.  Also,  the right side of her tongue involved.  See HPI for symptoms as outlined.  She felt was possibly related to starting mucinex.  Unclear etiology.  Has a history of multiple cancers.  Does have some mild hypercholesterolemia, but no significant risk factors otherwise for underlying vascular disease.  Possible etiologies include some form of migraine variant, TIA or some other form of neurological process.  Will obtain MRI brain.  Start aspirin daily (for now).  Discussed with neurology.  Dr Sherryll Burger agreed to see patient tomorrow.  Will treat for possible sinus infection as outlined.  Discussed with her that if she had any reoccurrence or worsening symptoms, she was to be evaluated immediately.  She was comfortable with this plan.    HEADACHE.  Persistent.  Unclear if related to an underlying sinus infection.  She states feels similar to her previous sinus infections.  Treat with ceftin 250mg  bid x 10 days.  Saline nasal spray and steroid nasal spray as directed.  Will check ESR.  Follow up with  neurology for further evaluation as outlined.    INCREASED PSYCHOSOCIAL STRESSORS.  On Lexapro and Clonazepam.  Seeing Dr Lenis Noon.    Overall appears to be doing better.  Follow.   HEALTH MAINTENANCE.  Pap negative 05/30/12.   Mammograms through Dr Silverio Lay office.  Colonoscopy 05/18/11 revealed internal hemorrhoids.  Colonoscopy was repeated 4/13.  No evidence of colitis.

## 2013-01-05 NOTE — Assessment & Plan Note (Signed)
Just had colonoscopy 4/13.  Bowels better.   

## 2013-01-05 NOTE — Assessment & Plan Note (Signed)
On synthroid.  Had recent quantitative thyroglobulin and anti-thyroglobulin antibody.  Have kept her tsh suppressed previously.  She was having increased palpitations.  Adjusted synthroid.  TSH last checked and within the normal range, but would like to keep a little more suppressed.  Adjusted synthroid to 100mcg (1/2 tab) one day per week and then continue on 100mcg q day all other days.  Follow tsh.    

## 2013-01-05 NOTE — Assessment & Plan Note (Signed)
ALT slightly elevated.  Follow liver panel.  Planning for abdominal ultrasound.

## 2013-01-05 NOTE — Assessment & Plan Note (Signed)
Symptoms appear to be controlled on current regimen.  Follow.   

## 2013-01-05 NOTE — Assessment & Plan Note (Signed)
Followed by Dr Pandit.  On aromasin.  Up to date with mammograms.  

## 2013-01-05 NOTE — Assessment & Plan Note (Addendum)
Stable on current regimen.  Follow.  Seeing Dr Levine.  

## 2013-01-09 ENCOUNTER — Encounter: Payer: Self-pay | Admitting: *Deleted

## 2013-01-18 ENCOUNTER — Encounter: Payer: Self-pay | Admitting: Internal Medicine

## 2013-01-20 LAB — CBC CANCER CENTER
Basophil #: 0 x10 3/mm (ref 0.0–0.1)
HCT: 44.4 % (ref 35.0–47.0)
Lymphocyte #: 0.9 x10 3/mm — ABNORMAL LOW (ref 1.0–3.6)
MCHC: 34.4 g/dL (ref 32.0–36.0)
Monocyte #: 0.3 x10 3/mm (ref 0.2–0.9)
Neutrophil #: 3.1 x10 3/mm (ref 1.4–6.5)
Neutrophil %: 69.9 %
Platelet: 199 x10 3/mm (ref 150–440)
RBC: 4.92 10*6/uL (ref 3.80–5.20)
RDW: 13.9 % (ref 11.5–14.5)
WBC: 4.4 x10 3/mm (ref 3.6–11.0)

## 2013-01-20 LAB — HEPATIC FUNCTION PANEL A (ARMC)
Albumin: 3.7 g/dL (ref 3.4–5.0)
Bilirubin,Total: 0.6 mg/dL (ref 0.2–1.0)
SGPT (ALT): 53 U/L (ref 12–78)
Total Protein: 7.3 g/dL (ref 6.4–8.2)

## 2013-01-20 LAB — CREATININE, SERUM: EGFR (African American): 52 — ABNORMAL LOW

## 2013-01-23 LAB — CEA: CEA: 3.2 ng/mL (ref 0.0–4.7)

## 2013-01-24 ENCOUNTER — Other Ambulatory Visit: Payer: Medicare Other

## 2013-02-01 ENCOUNTER — Ambulatory Visit: Payer: Self-pay | Admitting: Internal Medicine

## 2013-02-09 ENCOUNTER — Telehealth: Payer: Self-pay | Admitting: Internal Medicine

## 2013-02-09 MED ORDER — AZELASTINE-FLUTICASONE 137-50 MCG/ACT NA SUSP: 1.0000 | Freq: Two times a day (BID) | NASAL | Status: DC

## 2013-02-09 NOTE — Telephone Encounter (Signed)
Rx sent to pharmacy & sample and coupon left up front for patient to pick up

## 2013-02-09 NOTE — Telephone Encounter (Signed)
The patient is wanting a prescription called into the pharmacy for Dymista.

## 2013-02-15 ENCOUNTER — Encounter: Payer: Self-pay | Admitting: Internal Medicine

## 2013-02-20 ENCOUNTER — Encounter: Payer: Self-pay | Admitting: Internal Medicine

## 2013-02-20 ENCOUNTER — Ambulatory Visit (INDEPENDENT_AMBULATORY_CARE_PROVIDER_SITE_OTHER): Payer: Medicare Other | Admitting: Internal Medicine

## 2013-02-20 ENCOUNTER — Encounter: Payer: Self-pay | Admitting: Emergency Medicine

## 2013-02-20 ENCOUNTER — Encounter (INDEPENDENT_AMBULATORY_CARE_PROVIDER_SITE_OTHER): Payer: Self-pay

## 2013-02-20 VITALS — BP 130/82 | HR 84 | Temp 98.6°F | Ht 64.0 in | Wt 154.0 lb

## 2013-02-20 DIAGNOSIS — Z85038 Personal history of other malignant neoplasm of large intestine: Secondary | ICD-10-CM

## 2013-02-20 DIAGNOSIS — M810 Age-related osteoporosis without current pathological fracture: Secondary | ICD-10-CM

## 2013-02-20 DIAGNOSIS — R002 Palpitations: Secondary | ICD-10-CM

## 2013-02-20 DIAGNOSIS — R945 Abnormal results of liver function studies: Secondary | ICD-10-CM

## 2013-02-20 DIAGNOSIS — F419 Anxiety disorder, unspecified: Secondary | ICD-10-CM

## 2013-02-20 DIAGNOSIS — C50919 Malignant neoplasm of unspecified site of unspecified female breast: Secondary | ICD-10-CM

## 2013-02-20 DIAGNOSIS — K219 Gastro-esophageal reflux disease without esophagitis: Secondary | ICD-10-CM

## 2013-02-20 DIAGNOSIS — Z9109 Other allergy status, other than to drugs and biological substances: Secondary | ICD-10-CM

## 2013-02-20 DIAGNOSIS — F411 Generalized anxiety disorder: Secondary | ICD-10-CM

## 2013-02-20 DIAGNOSIS — R319 Hematuria, unspecified: Secondary | ICD-10-CM

## 2013-02-20 DIAGNOSIS — G609 Hereditary and idiopathic neuropathy, unspecified: Secondary | ICD-10-CM

## 2013-02-20 DIAGNOSIS — E785 Hyperlipidemia, unspecified: Secondary | ICD-10-CM

## 2013-02-20 DIAGNOSIS — J329 Chronic sinusitis, unspecified: Secondary | ICD-10-CM

## 2013-02-20 DIAGNOSIS — M25522 Pain in left elbow: Secondary | ICD-10-CM

## 2013-02-20 DIAGNOSIS — M25529 Pain in unspecified elbow: Secondary | ICD-10-CM

## 2013-02-20 DIAGNOSIS — R7989 Other specified abnormal findings of blood chemistry: Secondary | ICD-10-CM

## 2013-02-20 DIAGNOSIS — C73 Malignant neoplasm of thyroid gland: Secondary | ICD-10-CM

## 2013-02-20 LAB — HEPATIC FUNCTION PANEL
ALT: 64 U/L — ABNORMAL HIGH (ref 0–35)
AST: 26 U/L (ref 0–37)
Albumin: 3.8 g/dL (ref 3.5–5.2)
Alkaline Phosphatase: 86 U/L (ref 39–117)
Bilirubin, Direct: 0.1 mg/dL (ref 0.0–0.3)
Total Bilirubin: 0.9 mg/dL (ref 0.3–1.2)
Total Protein: 6.6 g/dL (ref 6.0–8.3)

## 2013-02-21 ENCOUNTER — Other Ambulatory Visit: Payer: Self-pay | Admitting: Internal Medicine

## 2013-02-23 ENCOUNTER — Other Ambulatory Visit: Payer: Self-pay | Admitting: Internal Medicine

## 2013-02-23 DIAGNOSIS — R7989 Other specified abnormal findings of blood chemistry: Secondary | ICD-10-CM

## 2013-02-23 DIAGNOSIS — R945 Abnormal results of liver function studies: Secondary | ICD-10-CM

## 2013-02-23 NOTE — Progress Notes (Signed)
Order placed for f/u ultrasound

## 2013-02-25 ENCOUNTER — Encounter: Payer: Self-pay | Admitting: Internal Medicine

## 2013-02-25 DIAGNOSIS — Z9109 Other allergy status, other than to drugs and biological substances: Secondary | ICD-10-CM | POA: Insufficient documentation

## 2013-02-25 DIAGNOSIS — M25522 Pain in left elbow: Secondary | ICD-10-CM | POA: Insufficient documentation

## 2013-02-25 NOTE — Assessment & Plan Note (Signed)
Followed by Dr Pandit.  On aromasin.  Up to date with mammograms.  

## 2013-02-25 NOTE — Assessment & Plan Note (Signed)
Just had colonoscopy 4/13.  Bowels better.   

## 2013-02-25 NOTE — Assessment & Plan Note (Signed)
On synthroid.  Had recent quantitative thyroglobulin and anti-thyroglobulin antibody.  Have kept her tsh suppressed previously.  She was having increased palpitations.  Adjusted synthroid.  TSH last checked and within the normal range, but would like to keep a little more suppressed.  Adjusted synthroid to 100mcg (1/2 tab) one day per week and then continue on 100mcg q day all other days.  Follow tsh.    

## 2013-02-25 NOTE — Assessment & Plan Note (Signed)
Does well on dymista.  Still with persistent intermittent symptoms.  Saline nasal spray and dymista as directed.  She request referral to Dr Elenore Rota.

## 2013-02-25 NOTE — Assessment & Plan Note (Signed)
Appears to be c/w tendonitis.  Elbow strap.  Follow.

## 2013-02-25 NOTE — Assessment & Plan Note (Signed)
Stable on current regimen.  Follow.  Seeing Dr Levine.  

## 2013-02-25 NOTE — Assessment & Plan Note (Addendum)
Was referred to Dr Wolff for further w/up.  Was treated recently for uti.  Needs f/u urinalysis to confirm no blood.    Will need referral back to urology if persistent hematuria.    

## 2013-02-25 NOTE — Assessment & Plan Note (Signed)
Recent bone density as outlined previously.   On aromasin.   Discussed treatment options.  Pt was agreeable to reclast.  Need to make sure she is receiving.  Continue calcium and vitamin D.

## 2013-02-25 NOTE — Progress Notes (Signed)
Subjective:    Patient ID: Debra Hale, female    DOB: 1951-04-15, 62 y.o.   MRN: [id number]  HPI 62 year old female with past history of thyroid carcinoma s/p surgery and XRT, colon cancer s/p laparoscopic right colectomy followed by Dr Sherrlyn Hock, nephrolithiasis, anxiety, osteoporosis and breast cancer who comes in today for a scheduled follow up.  She states overall she is doing relatively well. We had previously adjusted her thyroid medication and since, she has not noticed any palpitations.  Breathing stable.  Dymista works well for her.  Last visit she was experiencing headache and had noticed some right arm numbness.  See last note for details.  Saw Dr Sherryll Burger.  MRI unrevealing of an acute abnormality.  Instructed to take aspirin daily.  No reoccurrence.  She does report that she has noticed some discomfort in the top of her head.  Some increased nasal congestion.  Blood tinged mucus.  No severe headache.  States dymista works well for her.  Has had persistent intermittent problems with her sinuses and request referral to ENT for evaluation and to discuss other treatment options.  Eating and drinking well.  No acid reflux.  No abdominal pain or cramping.  Bowels stable.  She also reports left elbow pain.  Noticed after heavy lifting.  Increased pain with flexion.     Past Medical History  Diagnosis Date  . Palpitations   . Other and unspecified hyperlipidemia   . Unspecified hereditary and idiopathic peripheral neuropathy   . Personal history of malignant neoplasm of large intestine     carcinoma - cecum, s/p right laparoscopic colectomy - s/p chemotherapy and XRT  . Malignant neoplasm of breast (female), unspecified site     s/p lumpectomy  . Malignant neoplasm of thyroid gland     s/p surgery and XRT  . Anxiety   . Liver nodule     s/p negative biopsy  . Nephrolithiasis     s/p lithotripsy  . GERD (gastroesophageal reflux disease)   . Osteoporosis     Current Outpatient  Prescriptions on File Prior to Visit  Medication Sig Dispense Refill  . Azelastine-Fluticasone (DYMISTA) 137-50 MCG/ACT SUSP Place 1 spray into the nose 2 (two) times daily.  23 g  5  . bismuth subsalicylate (PEPTO-BISMOL) 262 MG/15ML suspension Take 15 mLs by mouth every 6 (six) hours as needed.      . clonazePAM (KLONOPIN) 1 MG tablet Take 1 mg by mouth 2 (two) times daily as needed.        Marland Kitchen escitalopram (LEXAPRO) 20 MG tablet Take 20 mg by mouth daily.        Marland Kitchen exemestane (AROMASIN) 25 MG tablet Take 25 mg by mouth daily.      Marland Kitchen gabapentin (NEURONTIN) 100 MG capsule Take 100 mg by mouth 3 (three) times daily. Take 2 tab       . ibuprofen (ADVIL,MOTRIN) 100 MG tablet Take 100 mg by mouth every 6 (six) hours as needed for fever.      . levothyroxine (SYNTHROID) 100 MCG tablet Take 1 tablet (100 mcg total) by mouth daily before breakfast.  90 tablet  3  . simvastatin (ZOCOR) 10 MG tablet Take 1 tablet (10 mg total) by mouth at bedtime.  30 tablet  5  . triamcinolone (NASACORT AQ) 55 MCG/ACT nasal inhaler Place 2 sprays into the nose daily.  1 Inhaler  4   No current facility-administered medications on file prior to visit.    Review  of Systems Headaches as outlined.  Increased nasal congestion as outlined.  No lightheadedness or dizziness.  Symptome better on Dyamista.  Still has persistent intermittent issues.  No chest pain, tightness.  No palpitations.  No increased shortness of breath, cough or congestion.  No nausea or vomiting.  No acute abdominal pain or change in symptoms.   No bowel change, such as diarrhea, constipation, BRBPR or melana.  Bowels better.  Left elbow pain as outlined.        Objective:   Physical Exam  Filed Vitals:   02/20/13 1400  BP: 130/82  Pulse: 84  Temp: 98.6 F (31 C)   62 year old female in no acute distress.   HEENT:  Nares- clear.  Oropharynx - without lesions. NECK:  Supple.  Nontender.  No audible bruit.  HEART:  Appears to be regular. LUNGS:   No crackles or wheezing audible.  Respirations even and unlabored.  RADIAL PULSE:  Equal bilaterally.    EXTREMITIES:  No increased edema present.  DP pulses palpable and equal bilaterally.             Assessment & Plan:  INCREASED PSYCHOSOCIAL STRESSORS.  On Lexapro and Clonazepam.  Seeing Dr Lenis Noon.    Overall appears to be doing better.  Follow.  HEALTH MAINTENANCE.  Pap negative 05/30/12.   Mammograms through Dr Silverio Lay office.  Colonoscopy 05/18/11 revealed internal hemorrhoids.  Colonoscopy was repeated 4/13.  No evidence of colitis.

## 2013-02-25 NOTE — Assessment & Plan Note (Signed)
Have resolved.  Follow.    

## 2013-02-25 NOTE — Assessment & Plan Note (Signed)
Low cholesterol diet and exercise.  Follow lipid panel and liver function.  On simvastatin.   

## 2013-02-25 NOTE — Assessment & Plan Note (Signed)
Symptoms appear to be controlled on current regimen.  Follow.   

## 2013-02-25 NOTE — Assessment & Plan Note (Addendum)
ALT slightly elevated.  Follow liver panel.  12/22/12 abdominal ultrasound revealed fatty liver.  Repeat liver panel.  Given her history, if liver enzymes continue to be elevated - then will refer to GI for evaluation.

## 2013-02-25 NOTE — Assessment & Plan Note (Signed)
Noticed since her chemo treatment.  Stable.   

## 2013-02-26 ENCOUNTER — Other Ambulatory Visit: Payer: Self-pay | Admitting: Internal Medicine

## 2013-03-06 ENCOUNTER — Encounter: Payer: Self-pay | Admitting: Emergency Medicine

## 2013-04-14 ENCOUNTER — Telehealth: Payer: Self-pay | Admitting: Internal Medicine

## 2013-04-14 NOTE — Telephone Encounter (Signed)
The patient is wanting samples of Dymista . She stated that it is a 100.00 dollars at the pharmacy.

## 2013-04-14 NOTE — Telephone Encounter (Signed)
Samples placed up front & pt notified. Also mentioned to pt that we could send it the generic substitutions (Flonase to use am & Astelin to use pm). Pt will let us know if she wants to try that.

## 2013-05-19 ENCOUNTER — Other Ambulatory Visit: Payer: Self-pay | Admitting: Internal Medicine

## 2013-05-25 ENCOUNTER — Encounter: Payer: Self-pay | Admitting: Internal Medicine

## 2013-05-25 ENCOUNTER — Ambulatory Visit (INDEPENDENT_AMBULATORY_CARE_PROVIDER_SITE_OTHER): Payer: Medicare Other | Admitting: Internal Medicine

## 2013-05-25 VITALS — BP 118/70 | HR 89 | Temp 98.4°F | Ht 64.0 in | Wt 150.0 lb

## 2013-05-25 DIAGNOSIS — M25529 Pain in unspecified elbow: Secondary | ICD-10-CM

## 2013-05-25 DIAGNOSIS — R002 Palpitations: Secondary | ICD-10-CM

## 2013-05-25 DIAGNOSIS — M25522 Pain in left elbow: Secondary | ICD-10-CM

## 2013-05-25 DIAGNOSIS — Z9109 Other allergy status, other than to drugs and biological substances: Secondary | ICD-10-CM

## 2013-05-25 DIAGNOSIS — F411 Generalized anxiety disorder: Secondary | ICD-10-CM

## 2013-05-25 DIAGNOSIS — R7989 Other specified abnormal findings of blood chemistry: Secondary | ICD-10-CM

## 2013-05-25 DIAGNOSIS — M810 Age-related osteoporosis without current pathological fracture: Secondary | ICD-10-CM

## 2013-05-25 DIAGNOSIS — Z85038 Personal history of other malignant neoplasm of large intestine: Secondary | ICD-10-CM

## 2013-05-25 DIAGNOSIS — G609 Hereditary and idiopathic neuropathy, unspecified: Secondary | ICD-10-CM

## 2013-05-25 DIAGNOSIS — R945 Abnormal results of liver function studies: Secondary | ICD-10-CM

## 2013-05-25 DIAGNOSIS — C50919 Malignant neoplasm of unspecified site of unspecified female breast: Secondary | ICD-10-CM

## 2013-05-25 DIAGNOSIS — K219 Gastro-esophageal reflux disease without esophagitis: Secondary | ICD-10-CM

## 2013-05-25 DIAGNOSIS — C73 Malignant neoplasm of thyroid gland: Secondary | ICD-10-CM

## 2013-05-25 DIAGNOSIS — F419 Anxiety disorder, unspecified: Secondary | ICD-10-CM

## 2013-05-25 DIAGNOSIS — E785 Hyperlipidemia, unspecified: Secondary | ICD-10-CM

## 2013-05-25 DIAGNOSIS — R319 Hematuria, unspecified: Secondary | ICD-10-CM

## 2013-05-25 NOTE — Progress Notes (Signed)
Pre-visit discussion using our clinic review tool. No additional management support is needed unless otherwise documented below in the visit note.  

## 2013-05-28 ENCOUNTER — Encounter: Payer: Self-pay | Admitting: Internal Medicine

## 2013-05-28 NOTE — Assessment & Plan Note (Signed)
Low cholesterol diet and exercise.  Follow lipid panel and liver function.  On simvastatin.   

## 2013-05-28 NOTE — Assessment & Plan Note (Signed)
Just had colonoscopy 4/13.  Bowels better.

## 2013-05-28 NOTE — Assessment & Plan Note (Signed)
Symptoms appear to be controlled on current regimen.  Follow.   

## 2013-05-28 NOTE — Assessment & Plan Note (Signed)
ALT slightly elevated.  Follow liver panel.  12/22/12 abdominal ultrasound revealed fatty liver. Saw GI.  Receiving hepatitis injections.  Follow liver panel.

## 2013-05-28 NOTE — Assessment & Plan Note (Signed)
On synthroid.  Had recent quantitative thyroglobulin and anti-thyroglobulin antibody.  Have kept her tsh suppressed previously.  She was having increased palpitations.  Adjusted synthroid.  TSH last checked and within the normal range, but would like to keep a little more suppressed.  Adjusted synthroid to 100mcg (1/2 tab) one day per week and then continue on 100mcg q day all other days.  Follow tsh.    

## 2013-05-28 NOTE — Assessment & Plan Note (Signed)
Stable on current regimen.  Follow.  Seeing Dr Clovis Riley.

## 2013-05-28 NOTE — Assessment & Plan Note (Signed)
Noticed since her chemo treatment.  Stable.   

## 2013-05-28 NOTE — Assessment & Plan Note (Signed)
Recent bone density as outlined previously.   On aromasin.   Discussed treatment options.  Pt was agreeable to reclast.  Has not received.   Continue vitamin D.  Check on reclast infusion.

## 2013-05-28 NOTE — Assessment & Plan Note (Signed)
Does well on dymista.  Referred to ENT.

## 2013-05-28 NOTE — Assessment & Plan Note (Signed)
Not an issue now.  Follow.    

## 2013-05-28 NOTE — Assessment & Plan Note (Signed)
Was referred to Dr Wolff for further w/up.  Was treated recently for uti.  Needs f/u urinalysis to confirm no blood.    Will need referral back to urology if persistent hematuria.    

## 2013-05-28 NOTE — Progress Notes (Signed)
Subjective:    Patient ID: Debra Hale, female    DOB: [birth year], 63 y.o.   MRN: [id number]  HPI 63 year old female with past history of thyroid carcinoma s/p surgery and XRT, colon cancer s/p laparoscopic right colectomy followed by Dr Ma Hillock, nephrolithiasis, anxiety, osteoporosis and breast cancer who comes in today for a scheduled follow up.  She states overall she is doing relatively well. We had previously adjusted her thyroid medication and since, she has not noticed any palpitations.  Breathing stable.  Dymista works well for her.  Previous visit she was experiencing headache and had noticed some right arm numbness.  See last note for details.  Saw Dr Manuella Ghazi.  MRI unrevealing of an acute abnormality.  Instructed to take aspirin daily.  No reoccurrence.   Eating and drinking well.  No acid reflux.  No abdominal pain or cramping.  Bowels stable.  Overall she feels she is doing well.     Past Medical History  Diagnosis Date  . Palpitations   . Other and unspecified hyperlipidemia   . Unspecified hereditary and idiopathic peripheral neuropathy   . Personal history of malignant neoplasm of large intestine     carcinoma - cecum, s/p right laparoscopic colectomy - s/p chemotherapy and XRT  . Malignant neoplasm of breast (female), unspecified site     s/p lumpectomy  . Malignant neoplasm of thyroid gland     s/p surgery and XRT  . Anxiety   . Liver nodule     s/p negative biopsy  . Nephrolithiasis     s/p lithotripsy  . GERD (gastroesophageal reflux disease)   . Osteoporosis     Current Outpatient Prescriptions on File Prior to Visit  Medication Sig Dispense Refill  . Azelastine-Fluticasone (DYMISTA) 137-50 MCG/ACT SUSP Place 1 spray into the nose 2 (two) times daily.  23 g  5  . bismuth subsalicylate (PEPTO-BISMOL) 262 MG/15ML suspension Take 15 mLs by mouth every 6 (six) hours as needed.      . clonazePAM (KLONOPIN) 1 MG tablet Take 1 mg by mouth 2 (two) times daily as needed.         Marland Kitchen escitalopram (LEXAPRO) 20 MG tablet Take 20 mg by mouth daily.        Marland Kitchen exemestane (AROMASIN) 25 MG tablet Take 25 mg by mouth daily.      Marland Kitchen ibuprofen (ADVIL,MOTRIN) 100 MG tablet Take 100 mg by mouth every 6 (six) hours as needed for fever.      . levothyroxine (SYNTHROID) 100 MCG tablet Take 1 tablet (100 mcg total) by mouth daily before breakfast.  90 tablet  3  . simvastatin (ZOCOR) 10 MG tablet TAKE 1 TABLET BY MOUTH AT BEDTIME  30 tablet  5  . triamcinolone (NASACORT) 55 MCG/ACT AERO nasal inhaler PLACE 2 SPRAYS INTO THE NOSE DAILY.  16.5 g  5  . gabapentin (NEURONTIN) 100 MG capsule Take 100 mg by mouth 3 (three) times daily. Take 2 tab        No current facility-administered medications on file prior to visit.    Review of Systems no problems with headaches reported today.   No lightheadedness or dizziness.  Allergy symptoms better on Dyamista.  No chest pain, tightness.  No palpitations.  No increased shortness of breath, cough or congestion.  No nausea or vomiting.  No acute abdominal pain or change in symptoms.   No bowel change, such as diarrhea, constipation, BRBPR or melana.  Bowels better. States has mammogram  scheduled.         Objective:   Physical Exam  Filed Vitals:   05/25/13 1505  BP: 118/70  Pulse: 89  Temp: 98.4 F (36.9 C)   Blood pressure recheck:  72/23  63 year old female in no acute distress.   HEENT:  Nares- clear.  Oropharynx - without lesions. NECK:  Supple.  Nontender.  No audible bruit.  HEART:  Appears to be regular. LUNGS:  No crackles or wheezing audible.  Respirations even and unlabored.  RADIAL PULSE:  Equal bilaterally.  ABDOMEN:  Soft.  Non tender.  No audible abdominal bruit.     EXTREMITIES:  No increased edema present.  DP pulses palpable and equal bilaterally.             Assessment & Plan:  INCREASED PSYCHOSOCIAL STRESSORS.  On Lexapro and Clonazepam.  Seeing Dr Clovis Riley.    Overall appears to be doing better.  Follow.  HEALTH  MAINTENANCE.  Pap negative 05/30/12.   Mammograms through Dr Candelaria Stagers office.  States has mammogram scheduled.  Colonoscopy 05/18/11 revealed internal hemorrhoids.  Colonoscopy was repeated 4/13.  No evidence of colitis.

## 2013-05-28 NOTE — Assessment & Plan Note (Signed)
Followed by Dr Ma Hillock.  On aromasin.  Up to date with mammograms.

## 2013-05-28 NOTE — Assessment & Plan Note (Signed)
Have resolved.  Follow.

## 2013-05-30 ENCOUNTER — Telehealth: Payer: Self-pay | Admitting: *Deleted

## 2013-05-30 NOTE — Telephone Encounter (Signed)
Pt came by office today to report that she has been experiencing pain in neck when she takes a deep breath & it hurts down into her chest. Sx's started when she woke up on Monday morning. Pt denies any heavy lifting or strenuous activity. She did report that she was outside in the wind a lot over the weekend. Denies dysphagia & denies pain with head movement. Took Advil this morning & helped until it wore off. Vitals in office: Temp: 98.6, BP: 120/80, Pulse: 92, SPO2: 97. After discussing symptoms with Dr. Nicki Reaper & based on the time of walk-in (still had 3 patients to see), pt was advised to go to Acute Care today. Pt verbalized understanding & stated that she would go. I also asked pt to call me tomorrow to let me know where she when & give me an update on treatment & sx's.

## 2013-05-31 ENCOUNTER — Telehealth: Payer: Self-pay | Admitting: *Deleted

## 2013-05-31 ENCOUNTER — Ambulatory Visit (INDEPENDENT_AMBULATORY_CARE_PROVIDER_SITE_OTHER): Payer: Medicare Other | Admitting: Cardiovascular Disease

## 2013-05-31 ENCOUNTER — Ambulatory Visit: Payer: Self-pay | Admitting: Cardiovascular Disease

## 2013-05-31 ENCOUNTER — Other Ambulatory Visit: Payer: Self-pay

## 2013-05-31 ENCOUNTER — Telehealth: Payer: Self-pay | Admitting: Internal Medicine

## 2013-05-31 ENCOUNTER — Encounter: Payer: Self-pay | Admitting: Cardiovascular Disease

## 2013-05-31 VITALS — BP 110/64 | HR 107 | Ht 64.0 in | Wt 148.2 lb

## 2013-05-31 DIAGNOSIS — I309 Acute pericarditis, unspecified: Secondary | ICD-10-CM

## 2013-05-31 DIAGNOSIS — R079 Chest pain, unspecified: Secondary | ICD-10-CM

## 2013-05-31 DIAGNOSIS — I2699 Other pulmonary embolism without acute cor pulmonale: Secondary | ICD-10-CM

## 2013-05-31 HISTORY — DX: Acute pericarditis, unspecified: I30.9

## 2013-05-31 LAB — SEDIMENTATION RATE: Erythrocyte Sed Rate: 28 mm/hr (ref 0–30)

## 2013-05-31 LAB — TROPONIN I

## 2013-05-31 NOTE — Patient Instructions (Signed)
Please go to [facility] after you leave the office to have:  Troponin  D Dimer  ESR  Chest X ray    Your physician has requested that you have an echocardiogram. 06/01/13 at 4:30 pm Echocardiography is a painless test that uses sound waves to create images of your heart. It provides your doctor with information about the size and shape of your heart and how well your heart's chambers and valves are working. This procedure takes approximately one hour. There are no restrictions for this procedure.

## 2013-05-31 NOTE — Telephone Encounter (Signed)
Reviewed.  Pt was offered transport to be evaluated.  States she felt fine driving.  Will call with update.

## 2013-05-31 NOTE — Assessment & Plan Note (Signed)
Her chest pain is overall atypical for angina. There is a pleuritic component. Possibilities include musculoskeletal pain, pleurisy, peripheral pulmonary embolism (which is doubtful given no signs of DVT and no associated dyspnea). Myocardial ischemia is much less likely also given the nature of her symptoms. I will obtain labs today including d-dimer, troponin level and sedimentation rate. I will also obtain a chest x-ray and an echocardiogram to ensure no precordial effusion. I don't hear pericardial rubs by exam. She can continue to use nonsteroidal anti-inflammatory medications for now such as Advil or ibuprofen. If above workup is unremarkable, no further testing will be required unless symptoms persist.

## 2013-05-31 NOTE — Telephone Encounter (Signed)
Informed patient that per Dr. Fletcher Anon. She needs ct to rule out PE. Scheduled patient to have CT today at 4pm. Patient verbalized understanding.   Orders sent to [facility] for CT and Lab.

## 2013-05-31 NOTE — Progress Notes (Signed)
Primary care physician: Dr. [name]  HPI  This is a pleasant 63 year old Caucasian female who was referred for evaluation of chest pain. She has no previous cardiac history. She has known history of treated hyperlipidemia. There is no history of hypertension, diabetes, tobacco use and no family history of coronary artery disease. Other medical conditions include thyroid carcinoma s/p surgery and XRT, colon cancer s/p laparoscopic right colectomy followed by Dr Ma Hillock, nephrolithiasis, anxiety, osteoporosis and breast cancer. She started having chest pains on Monday which has been intermittent since then. It is mostly in the upper substernal and right-sided areas. This is described as aching sensation with no radiation. It is not associated with dyspnea, diaphoresis or nausea. It gets worse with deep breath and changing position. It has been associated with palpitations. She has been more anxious lately after the death of her best friend due to colon cancer and pulmonary embolism. She also had recent symptoms suggestive of sinusitis and reports having chills yesterday but no fever. The chest pain was severe last night but improved with Advil.  Allergies  Allergen Reactions  . Bentyl [Dicyclomine Hcl]   . Ciprofloxacin Diarrhea  . Codeine Other (See Comments)    dizziness    . Epinephrine   . Flagyl [Metronidazole]   . Librax [Chlordiazepoxide-Clidinium]   . Phenobarbital   . Tetracyclines & Related Other (See Comments)    Throat swells   . Ultram [Tramadol] Other (See Comments)    Sick feeling     Current Outpatient Prescriptions on File Prior to Visit  Medication Sig Dispense Refill  . Azelastine-Fluticasone (DYMISTA) 137-50 MCG/ACT SUSP Place 1 spray into the nose 2 (two) times daily.  23 g  5  . bismuth subsalicylate (PEPTO-BISMOL) 262 MG/15ML suspension Take 15 mLs by mouth every 6 (six) hours as needed.      . clonazePAM (KLONOPIN) 1 MG tablet Take 1 mg by mouth 2 (two)  times daily as needed.        Marland Kitchen escitalopram (LEXAPRO) 20 MG tablet Take 20 mg by mouth daily.        Marland Kitchen exemestane (AROMASIN) 25 MG tablet Take 25 mg by mouth daily.      Marland Kitchen ibuprofen (ADVIL,MOTRIN) 100 MG tablet Take 100 mg by mouth every 6 (six) hours as needed for fever.      . levothyroxine (SYNTHROID) 100 MCG tablet Take 1 tablet (100 mcg total) by mouth daily before breakfast.  90 tablet  3  . pantoprazole (PROTONIX) 40 MG tablet TAKE 1 TABLET BY MOUTH  DAILY.      . simvastatin (ZOCOR) 10 MG tablet TAKE 1 TABLET BY MOUTH AT BEDTIME  30 tablet  5   No current facility-administered medications on file prior to visit.     Past Medical History  Diagnosis Date  . Palpitations   . Other and unspecified hyperlipidemia   . Unspecified hereditary and idiopathic peripheral neuropathy   . Personal history of malignant neoplasm of large intestine     carcinoma - cecum, s/p right laparoscopic colectomy - s/p chemotherapy and XRT  . Anxiety   . Liver nodule     s/p negative biopsy  . Nephrolithiasis     s/p lithotripsy  . GERD (gastroesophageal reflux disease)   . Osteoporosis   . Malignant neoplasm of breast (female), unspecified site     s/p lumpectomy  . Malignant neoplasm of thyroid gland     s/p surgery and XRT     Past Surgical History  Procedure Laterality Date  . Appendectomy  1985  . Cholecystectomy  1995  . Thyroid lobectomy  2002    s/p XRT  . Laparoscopic partial colectomy      stage 3-C carcinoma of the cecum, s/p chemotherapy and xrt  . Lithotripsy    . Dilation and curettage of uterus  1990  . Breast lumpectomy  2009    breast cancer     Family History  Problem Relation Age of Onset  . Stroke Mother     26s  . Lung cancer Father   . Prostate cancer Father   . Breast cancer Sister   . Lung cancer Sister   . Breast cancer      aunt     History   Social History  . Marital Status: Divorced    Spouse Name: N/A    Number of Children: 0  . Years of  Education: N/A   Occupational History  . Not on file.   Social History Main Topics  . Smoking status: Never Smoker   . Smokeless tobacco: Never Used  . Alcohol Use: No  . Drug Use: No  . Sexual Activity: Not on file   Other Topics Concern  . Not on file   Social History Narrative  . No narrative on file     ROS A 10 point review of system was performed. It is negative other than that mentioned in the history of present illness.   PHYSICAL EXAM   BP 110/64  Pulse 107  Ht 5\' 4"  (1.626 m)  Wt 148 lb 4 oz (67.246 kg)  BMI 25.43 kg/m2 Constitutional: She is oriented to person, place, and time. She appears well-developed and well-nourished. No distress.  HENT: No nasal discharge.  Head: Normocephalic and atraumatic.  Eyes: Pupils are equal and round. No discharge.  Neck: Normal range of motion. Neck supple. No JVD present. No thyromegaly present.  Cardiovascular: Tachycardic, regular rhythm, normal heart sounds. Exam reveals no gallop and no friction rub. No murmur heard.  Pulmonary/Chest: Effort normal and breath sounds normal. No stridor. No respiratory distress. She has no wheezes. She has no rales. She exhibits no tenderness.  Abdominal: Soft. Bowel sounds are normal. She exhibits no distension. There is no tenderness. There is no rebound and no guarding.  Musculoskeletal: Normal range of motion. She exhibits no edema and no tenderness.  Neurological: She is alert and oriented to person, place, and time. Coordination normal.  Skin: Skin is warm and dry. No rash noted. She is not diaphoretic. No erythema. No pallor.  Psychiatric: She has a normal mood and affect. Her behavior is normal. Judgment and thought content normal.     EKG: Normal sinus rhythm with nonspecific ST changes.   ASSESSMENT AND PLAN

## 2013-05-31 NOTE — Telephone Encounter (Signed)
Spoke to Debra Hale 05/30/13 regarding her acute care visit.  Had EKG.  Was given meloxicam.  Is afraid to take meloxicam.  Hurts to take a deep breath.  Some chest discomfort.  Took aspirin approximately one hour before I spoke to her.  Pain has improved some, but still with pain.  Discussed at length with her.  She has eaten some soup.  Has advil.  Will take.  She desires cardiology evaluation to confirm no cardiac origin.  Will see if we can arrange a visit soon with cardiology.

## 2013-05-31 NOTE — Addendum Note (Signed)
Addended by: Tracie Harrier on: 05/31/2013 03:22 PM   Modules accepted: Orders

## 2013-05-31 NOTE — Addendum Note (Signed)
Addended by: Tracie Harrier on: 05/31/2013 03:37 PM   Modules accepted: Orders

## 2013-06-01 ENCOUNTER — Other Ambulatory Visit: Payer: Self-pay

## 2013-06-01 ENCOUNTER — Other Ambulatory Visit (INDEPENDENT_AMBULATORY_CARE_PROVIDER_SITE_OTHER): Payer: Medicare Other

## 2013-06-01 DIAGNOSIS — R0602 Shortness of breath: Secondary | ICD-10-CM

## 2013-06-01 DIAGNOSIS — R079 Chest pain, unspecified: Secondary | ICD-10-CM

## 2013-06-05 ENCOUNTER — Telehealth: Payer: Self-pay | Admitting: Internal Medicine

## 2013-06-05 ENCOUNTER — Ambulatory Visit (INDEPENDENT_AMBULATORY_CARE_PROVIDER_SITE_OTHER): Payer: Medicare Other | Admitting: Internal Medicine

## 2013-06-05 ENCOUNTER — Encounter: Payer: Self-pay | Admitting: Internal Medicine

## 2013-06-05 VITALS — BP 130/82 | HR 86 | Temp 99.3°F | Wt 146.8 lb

## 2013-06-05 DIAGNOSIS — J019 Acute sinusitis, unspecified: Secondary | ICD-10-CM

## 2013-06-05 MED ORDER — CEFUROXIME AXETIL 500 MG PO TABS: 500.0000 mg | ORAL_TABLET | Freq: Two times a day (BID) | ORAL | Status: DC

## 2013-06-05 NOTE — Patient Instructions (Signed)

## 2013-06-05 NOTE — Progress Notes (Signed)
HPI  Debra Hale presents to the clinic today with c/o facial pain and sore throat. She reports this started 1 week ago. She did seem to have a common cold just prior to these symptoms starting. She denies fever, chills or body aches. She has been blowing blood tinged mucous out her nose. She has been trying Ibuprofen OTC. She does have a history of allergies and sinusitis. She has had sick contacts.  Review of Systems    Past Medical History  Diagnosis Date  . Palpitations   . Other and unspecified hyperlipidemia   . Unspecified hereditary and idiopathic peripheral neuropathy   . Personal history of malignant neoplasm of large intestine     carcinoma - cecum, s/p right laparoscopic colectomy - s/p chemotherapy and XRT  . Anxiety   . Liver nodule     s/p negative biopsy  . Nephrolithiasis     s/p lithotripsy  . GERD (gastroesophageal reflux disease)   . Osteoporosis   . Malignant neoplasm of breast (female), unspecified site     s/p lumpectomy  . Malignant neoplasm of thyroid gland     s/p surgery and XRT    Family History  Problem Relation Age of Onset  . Stroke Mother     41s  . Lung cancer Father   . Prostate cancer Father   . Breast cancer Sister   . Lung cancer Sister   . Breast cancer      aunt    History   Social History  . Marital Status: Divorced    Spouse Name: N/A    Number of Children: 0  . Years of Education: N/A   Occupational History  . Not on file.   Social History Main Topics  . Smoking status: Never Smoker   . Smokeless tobacco: Never Used  . Alcohol Use: No  . Drug Use: No  . Sexual Activity: Not on file   Other Topics Concern  . Not on file   Social History Narrative  . No narrative on file    Allergies  Allergen Reactions  . Bentyl [Dicyclomine Hcl]   . Ciprofloxacin Diarrhea  . Codeine Other (See Comments)    dizziness    . Epinephrine   . Flagyl [Metronidazole]   . Librax [Chlordiazepoxide-Clidinium]   . Phenobarbital   .  Tetracyclines & Related Other (See Comments)    Throat swells   . Ultram [Tramadol] Other (See Comments)    Sick feeling     Constitutional: Positive headache, fatigue. Denies fever or abrupt weight changes.  HEENT:  Positive eye pain, pressure behind the eyes, facial pain, nasal congestion and sore throat. Denies eye redness, ear pain, ringing in the ears, wax buildup, runny nose or bloody nose. Respiratory: Positive cough. Denies difficulty breathing or shortness of breath.  Cardiovascular: Denies chest pain, chest tightness, palpitations or swelling in the hands or feet.   No other specific complaints in a complete review of systems (except as listed in HPI above).  Objective:    BP 130/82  Pulse 86  Temp(Src) 99.3 F (37.4 C) (Oral)  Wt 146 lb 12 oz (66.565 kg)  SpO2 97% Wt Readings from Last 3 Encounters:  06/05/13 146 lb 12 oz (66.565 kg)  05/31/13 148 lb 4 oz (67.246 kg)  05/25/13 150 lb (68.04 kg)    General: Appears her stated age, well developed, well nourished in NAD. HEENT: Head: normal shape and size, frontal sinus tenderness noted; Eyes: sclera white, no icterus, conjunctiva pink,  PERRLA and EOMs intact; Ears: Tm's gray and intact, normal light reflex; Nose: mucosa pink and moist, septum midline; Throat/Mouth: + PND. Teeth present, mucosa pink and moist, no exudate noted, no lesions or ulcerations noted.  Neck: . Neck supple, trachea midline. No massses, lumps or thyromegaly present.  Cardiovascular: Normal rate and rhythm. S1,S2 noted.  No murmur, rubs or gallops noted. No JVD or BLE edema. No carotid bruits noted. Pulmonary/Chest: Normal effort and positive vesicular breath sounds. No respiratory distress. No wheezes, rales or ronchi noted.      Assessment & Plan:   Acute bacterial sinusitis  Can use a Neti Pot which can be purchased from your local drug store. Continue dymista CeftinBID for 10 days  RTC as needed or if symptoms persist.

## 2013-06-05 NOTE — Progress Notes (Signed)
Pre-visit discussion using our clinic review tool. No additional management support is needed unless otherwise documented below in the visit note.  

## 2013-06-05 NOTE — Telephone Encounter (Signed)
Patient Information:  Caller Name: Minie  Phone: [phone]  Patient: Debra Hale  Gender: Female  DOB: 03-29-1951  Age: 63 Years  PCP: [name]  Office Follow Up:  Does the office need to follow up with this patient?: No  Instructions For The Office: N/A   Symptoms  Reason For Call & Symptoms: Throat pain and body aches onset 05/29/13. She has Headache and sinus congestion and facial pressure/pain and ears feel achy on and off > R. Afebrile. Some bloody drainage from nose with blowing.   Reviewed Health History In EMR: Yes  Reviewed Medications In EMR: Yes  Reviewed Allergies In EMR: Yes  Reviewed Surgeries / Procedures: Yes  Date of Onset of Symptoms: 05/29/2013  Treatments Tried: Advil 21tabs PO every 8 hours  Treatments Tried Worked: No  Guideline(s) Used:  Sore Throat  Earache  Disposition Per Guideline:   See Today in Office  Reason For Disposition Reached:   Earache also present  Advice Given:  For Relief of Sore Throat Pain:  Sip warm chicken broth or apple juice.  Suck on hard candy or a throat lozenge (over-the-counter).  Gargle warm salt water 3 times daily (1 teaspoon of salt in 8 oz or 240 ml of warm water).  Pain Medicines:  For pain relief, you can take either acetaminophen, ibuprofen, or naproxen.  They are over-the-counter (OTC) pain drugs. You can buy them at the drugstore.  Pain Medicines:  For pain relief, you can take either acetaminophen, ibuprofen, or naproxen.  They are over-the-counter (OTC) pain drugs. You can buy them at the drugstore.  Ibuprofen (e.g., Motrin, Advil):  Take 400 mg (two 200 mg pills) by mouth every 6 hours.  Soft Diet:   Cold drinks and milk shakes are especially good (Reason: swollen tonsils can make some foods hard to swallow).  Call Back If:  Sore throat is the main symptom and it lasts longer than 24 hours  Sore throat is mild but lasts longer than 4 days  Fever lasts longer than 3 days  You become  worse.  Apply Cold to the Area for Pain:  Apply a cold pack or a cold wet washcloth to the outer ear for 20 minutes to reduce pain while the pain medicine takes effect (Note: Some individuals prefer local heat instead of cold for 20 minutes).  Contagiousness:  Ear infections are not contagious.  Call Back If  Earache last more than 1 hour  High fever, severe headache, or stiff neck occurs  You become worse.  Patient Will Follow Care Advice:  YES  Appointment Scheduled:  06/05/2013 14:45:00 Appointment Scheduled Provider:  Other

## 2013-06-19 ENCOUNTER — Encounter: Payer: Self-pay | Admitting: Cardiovascular Disease

## 2013-06-19 ENCOUNTER — Ambulatory Visit (INDEPENDENT_AMBULATORY_CARE_PROVIDER_SITE_OTHER): Payer: Medicare Other | Admitting: Cardiovascular Disease

## 2013-06-19 VITALS — BP 141/84 | HR 75 | Ht 64.0 in | Wt 149.2 lb

## 2013-06-19 DIAGNOSIS — R0609 Other forms of dyspnea: Secondary | ICD-10-CM

## 2013-06-19 DIAGNOSIS — R069 Unspecified abnormalities of breathing: Secondary | ICD-10-CM | POA: Insufficient documentation

## 2013-06-19 DIAGNOSIS — R0989 Other specified symptoms and signs involving the circulatory and respiratory systems: Secondary | ICD-10-CM

## 2013-06-19 DIAGNOSIS — R0600 Dyspnea, unspecified: Secondary | ICD-10-CM

## 2013-06-19 DIAGNOSIS — R002 Palpitations: Secondary | ICD-10-CM

## 2013-06-19 DIAGNOSIS — R079 Chest pain, unspecified: Secondary | ICD-10-CM

## 2013-06-19 NOTE — Assessment & Plan Note (Signed)
I doubt significant arrhythmia in the setting of normal LV systolic function and no structural heart abnormalities. This seems to be triggered by stress and anxiety. If symptoms become more frequent, some form of outpatient monitoring can be considered.

## 2013-06-19 NOTE — Progress Notes (Signed)
Primary care physician: Dr. [name]  HPI  This is a pleasant 63 year old Caucasian female who is here today for a followup visit regarding  chest pain. She has no previous cardiac history. She has known history of treated hyperlipidemia. There is no history of hypertension, diabetes, tobacco use and no family history of coronary artery disease. Other medical conditions include thyroid carcinoma s/p surgery and XRT, colon cancer s/p laparoscopic right colectomy followed by Dr Ma Hillock, nephrolithiasis, anxiety, osteoporosis and breast cancer. She was seen recently for intermittent chest pain which was felt to be pleuritic in nature with associated with palpitations. She  associated symptoms suggestive of sinusitis . D-dimer was mildly elevated. CTA of the chest showed no evidence of pulmonary embolism. Coronary calcifications were noted. An echocardiogram showed normal LV systolic function with trace pericardial effusion. Mild case of pericarditis could not be excluded. She was treated with NSAIDs with subsequent improvement in her symptoms.  She reports exertional dyspnea now but no chest pain. She continues to have intermittent palpitations but not as severe as before. She continues to be anxious.   Allergies  Allergen Reactions  . Bentyl [Dicyclomine Hcl]   . Ciprofloxacin Diarrhea  . Codeine Other (See Comments)    dizziness    . Epinephrine   . Flagyl [Metronidazole]   . Librax [Chlordiazepoxide-Clidinium]   . Phenobarbital   . Tetracyclines & Related Other (See Comments)    Throat swells   . Ultram [Tramadol] Other (See Comments)    Sick feeling     Current Outpatient Prescriptions on File Prior to Visit  Medication Sig Dispense Refill  . Azelastine-Fluticasone (DYMISTA) 137-50 MCG/ACT SUSP Place 1 spray into the nose 2 (two) times daily.  23 g  5  . bismuth subsalicylate (PEPTO-BISMOL) 262 MG/15ML suspension Take 15 mLs by mouth every 6 (six) hours as needed.      .  clonazePAM (KLONOPIN) 1 MG tablet Take 1 mg by mouth 2 (two) times daily as needed.        Marland Kitchen escitalopram (LEXAPRO) 20 MG tablet Take 20 mg by mouth daily.        Marland Kitchen exemestane (AROMASIN) 25 MG tablet Take 25 mg by mouth daily.      Marland Kitchen ibuprofen (ADVIL,MOTRIN) 100 MG tablet Take 100 mg by mouth every 6 (six) hours as needed for fever.      . levothyroxine (SYNTHROID) 100 MCG tablet Take 1 tablet (100 mcg total) by mouth daily before breakfast.  90 tablet  3  . pantoprazole (PROTONIX) 40 MG tablet TAKE 1 TABLET BY MOUTH  DAILY.      . simvastatin (ZOCOR) 10 MG tablet TAKE 1 TABLET BY MOUTH AT BEDTIME  30 tablet  5   No current facility-administered medications on file prior to visit.     Past Medical History  Diagnosis Date  . Palpitations   . Other and unspecified hyperlipidemia   . Unspecified hereditary and idiopathic peripheral neuropathy   . Personal history of malignant neoplasm of large intestine     carcinoma - cecum, s/p right laparoscopic colectomy - s/p chemotherapy and XRT  . Anxiety   . Liver nodule     s/p negative biopsy  . Nephrolithiasis     s/p lithotripsy  . GERD (gastroesophageal reflux disease)   . Osteoporosis   . Malignant neoplasm of breast (female), unspecified site     s/p lumpectomy  . Malignant neoplasm of thyroid gland     s/p surgery and XRT  Past Surgical History  Procedure Laterality Date  . Appendectomy  1985  . Cholecystectomy  1995  . Thyroid lobectomy  2002    s/p XRT  . Laparoscopic partial colectomy      stage 3-C carcinoma of the cecum, s/p chemotherapy and xrt  . Lithotripsy    . Dilation and curettage of uterus  1990  . Breast lumpectomy  2009    breast cancer     Family History  Problem Relation Age of Onset  . Stroke Mother     40s  . Lung cancer Father   . Prostate cancer Father   . Breast cancer Sister   . Lung cancer Sister   . Breast cancer      aunt     History   Social History  . Marital Status: Divorced      Spouse Name: N/A    Number of Children: 0  . Years of Education: N/A   Occupational History  . Not on file.   Social History Main Topics  . Smoking status: Never Smoker   . Smokeless tobacco: Never Used  . Alcohol Use: No  . Drug Use: No  . Sexual Activity: Not on file   Other Topics Concern  . Not on file   Social History Narrative  . No narrative on file     ROS A 10 point review of system was performed. It is negative other than that mentioned in the history of present illness.   PHYSICAL EXAM   BP 141/84  Pulse 75  Ht 5\' 4"  (1.626 m)  Wt 149 lb 4 oz (67.699 kg)  BMI 25.61 kg/m2 Constitutional: She is oriented to person, place, and time. She appears well-developed and well-nourished. No distress.  HENT: No nasal discharge.  Head: Normocephalic and atraumatic.  Eyes: Pupils are equal and round. No discharge.  Neck: Normal range of motion. Neck supple. No JVD present. No thyromegaly present.  Cardiovascular: Tachycardic, regular rhythm, normal heart sounds. Exam reveals no gallop and no friction rub. No murmur heard.  Pulmonary/Chest: Effort normal and breath sounds normal. No stridor. No respiratory distress. She has no wheezes. She has no rales. She exhibits no tenderness.  Abdominal: Soft. Bowel sounds are normal. She exhibits no distension. There is no tenderness. There is no rebound and no guarding.  Musculoskeletal: Normal range of motion. She exhibits no edema and no tenderness.  Neurological: She is alert and oriented to person, place, and time. Coordination normal.  Skin: Skin is warm and dry. No rash noted. She is not diaphoretic. No erythema. No pallor.  Psychiatric: She has a normal mood and affect. Her behavior is normal. Judgment and thought content normal.     EKG: Normal sinus rhythm with nonspecific ST changes.   ASSESSMENT AND PLAN

## 2013-06-19 NOTE — Patient Instructions (Signed)
Your physician has requested that you have an exercise tolerance test. For further information please visit [email or web address]. Please also follow instruction sheet, as given.  Follow up as needed.  

## 2013-06-19 NOTE — Assessment & Plan Note (Signed)
Was possibly due to pericarditis. Symptoms completely resolved.

## 2013-06-19 NOTE — Assessment & Plan Note (Signed)
Given the presence of coronary calcifications on CT scan, I recommend a treadmill stress test for evaluation. If stress test is unremarkable, I recommend treatment of risk factors.

## 2013-06-20 ENCOUNTER — Ambulatory Visit: Payer: Medicare Other | Admitting: Cardiovascular Disease

## 2013-06-22 ENCOUNTER — Encounter: Payer: Self-pay | Admitting: Cardiovascular Disease

## 2013-06-22 ENCOUNTER — Ambulatory Visit (INDEPENDENT_AMBULATORY_CARE_PROVIDER_SITE_OTHER): Payer: Medicare Other | Admitting: Cardiovascular Disease

## 2013-06-22 VITALS — BP 120/80 | HR 87 | Ht 64.0 in | Wt 150.5 lb

## 2013-06-22 DIAGNOSIS — R0989 Other specified symptoms and signs involving the circulatory and respiratory systems: Secondary | ICD-10-CM

## 2013-06-22 DIAGNOSIS — R0600 Dyspnea, unspecified: Secondary | ICD-10-CM

## 2013-06-22 DIAGNOSIS — R0609 Other forms of dyspnea: Secondary | ICD-10-CM

## 2013-06-22 NOTE — Procedures (Signed)
   Treadmill Stress test  Indication: Exertional dyspnea.  Baseline Data:  Resting EKG shows NSR with rate of 86 bpm, nonspecific ST changes Resting blood pressure of 120/80 mm Hg Stand bruce protocal was used.  Exercise Data:  Patient exercised for 1 min 56 sec,  Peak heart rate of 133 bpm.  This was 85 % of the maximum predicted heart rate. No symptoms of chest pain or lightheadedness were reported at peak stress or in recovery.  Peak Blood pressure recorded was 180/80 Maximal work level: 4.6 METs.  Heart rate at 3 minutes in recovery was 75 bpm. BP response: Normal HR response: deconditioned  EKG with Exercise: Sinus tachycardia with no significant ST changes.  FINAL IMPRESSION: Normal exercise stress test. No significant EKG changes concerning for ischemia. poor exercise tolerance.  Recommendation: Continue treatment of risk factors. I suggested starting an exercise program to improve conditioning.

## 2013-06-22 NOTE — Patient Instructions (Signed)
Your stress test is normal.  Follow up as needed.  

## 2013-07-01 ENCOUNTER — Emergency Department: Payer: Self-pay | Admitting: Emergency Medicine

## 2013-07-01 LAB — CBC
HCT: 38.9 % (ref 35.0–47.0)
HGB: 13.1 g/dL (ref 12.0–16.0)
MCH: 29.7 pg (ref 26.0–34.0)
MCHC: 33.7 g/dL (ref 32.0–36.0)
MCV: 88 fL (ref 80–100)
PLATELETS: 141 10*3/uL — AB (ref 150–440)
RBC: 4.42 10*6/uL (ref 3.80–5.20)
RDW: 13.6 % (ref 11.5–14.5)
WBC: 6.4 10*3/uL (ref 3.6–11.0)

## 2013-07-01 LAB — TROPONIN I
Troponin-I: <0.02 ng/mL
Troponin-I: <0.02 ng/mL

## 2013-07-01 LAB — BASIC METABOLIC PANEL WITH GFR
Anion Gap: 3 — ABNORMAL LOW
BUN: 14 mg/dL
Calcium, Total: 8.6 mg/dL
Chloride: 107 mmol/L
Co2: 30 mmol/L
Creatinine: 1.04 mg/dL
EGFR (African American): >60
EGFR (Non-African Amer.): 57 — ABNORMAL LOW
Glucose: 84 mg/dL
Osmolality: 279
Potassium: 3.3 mmol/L — ABNORMAL LOW
Sodium: 140 mmol/L

## 2013-07-04 ENCOUNTER — Ambulatory Visit (INDEPENDENT_AMBULATORY_CARE_PROVIDER_SITE_OTHER): Payer: Medicare Other | Admitting: Internal Medicine

## 2013-07-04 ENCOUNTER — Encounter: Payer: Self-pay | Admitting: Internal Medicine

## 2013-07-04 VITALS — BP 104/68 | HR 98 | Temp 97.9°F | Resp 18 | Wt 144.5 lb

## 2013-07-04 DIAGNOSIS — I309 Acute pericarditis, unspecified: Secondary | ICD-10-CM

## 2013-07-04 DIAGNOSIS — R079 Chest pain, unspecified: Secondary | ICD-10-CM

## 2013-07-04 MED ORDER — PREDNISONE (PAK) 10 MG PO TABS: ORAL_TABLET | ORAL | Status: DC

## 2013-07-04 MED ORDER — CELECOXIB 200 MG PO CAPS: 200.0000 mg | ORAL_CAPSULE | Freq: Two times a day (BID) | ORAL | Status: DC

## 2013-07-04 NOTE — Progress Notes (Signed)
Patient ID: Debra Hale, female   DOB: 1950-05-24, 63 y.o.   MRN: [id number]   Patient Active Problem List   Diagnosis Date Noted  . Dyspnea 06/19/2013  . Acute pericarditis, unspecified 05/31/2013  . Environmental allergies 02/25/2013  . Left elbow pain 02/25/2013  . Abnormal liver function test 12/07/2012  . Anxiety 05/06/2012  . GERD (gastroesophageal reflux disease) 05/06/2012  . Osteoporosis 05/06/2012  . Hematuria 05/06/2012  . ADENOCARCINOMA, BREAST 09/18/2008  . 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 HYPERLIPIDEMIA-MIXED 09/18/2008  . PERIPHERAL NEUROPATHY 09/18/2008  . PALPITATIONS 09/18/2008  . ADENOCARCINOMA, COLON, HX OF 09/18/2008    Subjective:  CC:   Chief Complaint  Patient presents with  . Follow-up    ER saturday 07/01/13 for chest pain that radiated from head to chest now patient pain in right shoulder when she takes a deep breath.    HPI:   Debra Hale is a 63 y.o. female who presents for evaluation of recurrent chest and shoulder pain    History as of Jan 25:  started with bilateral shoulder pain,  Then developed Chest pain started on the day of a dear friend's funeral  Then became centered over manubrium and esophagus,  Urgent care treated with a gi cocktail which did not help.   Saw NP Amedeo Gory on Feb 2 and treated for sinusitis with ceftin,  Which improved both the esophageal and shoulder pain and the sinus pain  But only transinetly for as long as she was on the medication .   But all 3 returned the day after she finished the ceftin .  Using Dymista regularly.  Say she has chronic sinus symptoms and ENT offered sinus surgery which was  Deferred due to friend's illness.    Cardiology evaluation of chest pain involved a Stress test which was interpreted as normal by Dr. Fletcher Anon.  She was told to start to start exercising.  An ECHO was done which suggested pericarditis.  However no treatment was given.   Went to [facility] 3 days ago for chest pain  and multiple tests were run (again) ,  Because [facility] wouldn't touch her.  Sunday night started having palpitations which lasted ght, felt like heart was pounding and bp felt elevated .   Had CT chest ,  serial enzymes Labs, EKG.  And released.      Past Medical History  Diagnosis Date  . Palpitations   . Other and unspecified hyperlipidemia   . Unspecified hereditary and idiopathic peripheral neuropathy   . Personal history of malignant neoplasm of large intestine     carcinoma - cecum, s/p right laparoscopic colectomy - s/p chemotherapy and XRT  . Anxiety   . Liver nodule     s/p negative biopsy  . Nephrolithiasis     s/p lithotripsy  . GERD (gastroesophageal reflux disease)   . Osteoporosis   . Malignant neoplasm of breast (female), unspecified site     s/p lumpectomy  . Malignant neoplasm of thyroid gland     s/p surgery and XRT    Past Surgical History  Procedure Laterality Date  . Appendectomy  1985  . Cholecystectomy  1995  . Thyroid lobectomy  2002    s/p XRT  . Laparoscopic partial colectomy      stage 3-C carcinoma of the cecum, s/p chemotherapy and xrt  . Lithotripsy    . Dilation and curettage of uterus  1990  . Breast lumpectomy  2009    breast  cancer       The following portions of the patient's history were reviewed and updated as appropriate: Allergies, current medications, and problem list.    Review of Systems:   Patient denies headache, fevers, malaise, unintentional weight loss, skin rash, eye pain, sinus congestion and sinus pain, sore throat, dysphagia,  hemoptysis , cough, dyspnea, wheezing, chest pain, palpitations, orthopnea, edema, abdominal pain, nausea, melena, diarrhea, constipation, flank pain, dysuria, hematuria, urinary  Frequency, nocturia, numbness, tingling, seizures,  Focal weakness, Loss of consciousness,  Tremor, insomnia, depression, anxiety, and suicidal ideation.     History   Social History  . Marital Status:  Divorced    Spouse Name: N/A    Number of Children: 0  . Years of Education: N/A   Occupational History  . Not on file.   Social History Main Topics  . Smoking status: Never Smoker   . Smokeless tobacco: Never Used  . Alcohol Use: No  . Drug Use: No  . Sexual Activity: Not on file   Other Topics Concern  . Not on file   Social History Narrative  . No narrative on file    Objective:  Filed Vitals:   07/04/13 1437  BP: 104/68  Pulse: 98  Temp: 97.9 F (36.6 C)  Resp: 18     General appearance: alert, cooperative and appears stated age Ears: normal TM's and external ear canals both ears Throat: lips, mucosa, and tongue normal; teeth and gums normal Neck: no adenopathy, no carotid bruit, supple, symmetrical, trachea midline and thyroid not enlarged, symmetric, no tenderness/mass/nodules Back: symmetric, no curvature. ROM normal. No CVA tenderness. Lungs: clear to auscultation bilaterally Heart: regular rate and rhythm, S1, S2 normal, no murmur, click, rub or gallop Abdomen: soft, non-tender; bowel sounds normal; no masses,  no organomegaly Pulses: 2+ and symmetric Skin: Skin color, texture, turgor normal. No rashes or lesions Lymph nodes: Cervical, supraclavicular, and axillary nodes normal.  Assessment and Plan:  Acute pericarditis, unspecified Suspect untreated pericarditis given ECHO on Jan 30th per Arida.  patient still having palpitations and chest pain consistent with pericarditis but never took NSAIDS (never got the message).  Since I am seeing her in an urgent slot and for the first time I  put her on prednisone taper for ESR 75 and will transition her  to ibuprofen  and colchine. Will suspend statin to reduce risk of myoapthy. She needs follow up with Dr Fletcher Anon. she has a history of cancer and did have a small pericardial effusion noted on the ECHO that was done in late January so this may need repeating  .   A total of 40 minutes was spent with patient more  than half of which was spent in counseling, reviewing records from other prviders and coordination of care.  Updated Medication List Outpatient Encounter Prescriptions as of 07/04/2013  Medication Sig  . Azelastine-Fluticasone (DYMISTA) 137-50 MCG/ACT SUSP Place 1 spray into the nose 2 (two) times daily.  . clonazePAM (KLONOPIN) 1 MG tablet Take 1 mg by mouth 2 (two) times daily as needed.    Marland Kitchen escitalopram (LEXAPRO) 20 MG tablet Take 20 mg by mouth daily.    Marland Kitchen exemestane (AROMASIN) 25 MG tablet Take 25 mg by mouth daily.  Marland Kitchen levothyroxine (SYNTHROID) 100 MCG tablet Take 1 tablet (100 mcg total) by mouth daily before breakfast.  . pantoprazole (PROTONIX) 40 MG tablet TAKE 1 TABLET BY MOUTH  DAILY.  . [DISCONTINUED] ibuprofen (ADVIL,MOTRIN) 100 MG tablet Take 100 mg by  mouth every 6 (six) hours as needed for fever.  . [DISCONTINUED] simvastatin (ZOCOR) 10 MG tablet TAKE 1 TABLET BY MOUTH AT BEDTIME  . bismuth subsalicylate (PEPTO-BISMOL) 262 MG/15ML suspension Take 15 mLs by mouth every 6 (six) hours as needed.  . colchicine 0.6 MG tablet Take 1 tablet (0.6 mg total) by mouth 2 (two) times daily.  Marland Kitchen ibuprofen (ADVIL,MOTRIN) 800 MG tablet Take 1 tablet (800 mg total) by mouth 3 (three) times daily.  . [DISCONTINUED] celecoxib (CELEBREX) 200 MG capsule Take 1 capsule (200 mg total) by mouth 2 (two) times daily.  . [DISCONTINUED] predniSONE (STERAPRED UNI-PAK) 10 MG tablet 6 tablets on Day 1 , then reduce by 1 tablet daily until gone     Orders Placed This Encounter  Procedures  . TSH  . Sedimentation rate    No Follow-up on file.

## 2013-07-04 NOTE — Progress Notes (Signed)
Pre-visit discussion using our clinic review tool. No additional management support is needed unless otherwise documented below in the visit note.  

## 2013-07-04 NOTE — Patient Instructions (Signed)
Pericarditis °Pericarditis is swelling (inflammation) of the pericardium. The pericardium is a thin, double-layered, fluid-filled tissue sac that surrounds the heart. The purpose of the pericardium is to contain the heart in the chest cavity and keep the heart from overexpanding. Different types of pericarditis can occur, such as: °· Acute pericarditis. Inflammation can develop suddenly in acute pericarditis. °· Chronic pericarditis. Inflammation develops gradually and is long-lasting in chronic pericarditis. °· Constrictive pericarditis. In this type of pericarditis, the layers of the pericardium stiffen and develop scar tissue. The scar tissue thickens and sticks together. This makes it difficult for the heart to pump and work as it normally does. °CAUSES  °Pericarditis can be caused from different conditions, such as: °· A bacterial, fungal or viral infection. °· After a heart attack (myocardial infarction). °· After open-heart surgery (coronary bypass graft surgery). °· Auto-immune conditions such as lupus, rheumatoid arthritis or scleroderma. °· Kidney failure. °· Low thyroid condition (hypothyroidism). °· Cancer from another part of the body that has spread (metastasized) to the pericardium. °· Chest injury or trauma. °· After radiation treatment. °· Certain medicines. °SYMPTOMS  °Symptoms of pericarditis can include: °· Chest pain. Chest pain symptoms may increase when laying down and may be relieved when sitting up and leaning forward. °· A chronic, dry cough. °· Heart palpitations. These may feel like rapid, fluttering or pounding heart beats. °· Chest pain may be worse when swallowing. °· Dizziness or fainting. °· Tiredness, fatigue or lethargy. °· Fever. °DIAGNOSIS  °Pericarditis is diagnosed by the following: °· A physical exam. A heart sound called a pericardial friction rub may be heard when your caregiver listens to your heart. °· Blood work. Blood may be drawn to check for an infection and to look at  your blood chemistry. °· Electrocardiography. During electrocardiography your heart's electrical activity is monitored and recorded with a tracing on paper (electrocardiogram [ECG]). °· Echocardiography. °· Computed tomography (CT). °· Magnetic resonance image (MRI). °TREATMENT  °To treat pericarditis, it is important to know the cause of it. The cause of pericarditis determines the treatment.  °· If the cause of pericarditis is due to an infection, treatment is based on the type of infection. If an infection is suspected in the pericardial fluid, a procedure called a pericardial fluid culture and biopsy may be done. This takes a sample of the pericardial fluid. The sample is sent to a lab which runs tests on the pericardial fluid to check for an infection. °· If the autoimmune disease is the cause, treatment of the autoimmune condition will help improve the pericarditis. °· If the cause of pericarditis is not known, anti-inflammatory medicines may be used to help decrease the inflammation. °· Surgery may be needed. The following are types of surgeries or procedures that may be done to treat pericarditis: °¨ Pericardial window. A pericardial window makes a cut (incision) into the pericardial sac. This allows excess fluid in the pericardium to drain. °¨ Pericardiocentesis. A pericardiocentesis is also known as a pericardial tap. This procedure uses a needle that is guided by X-ray to drain (aspirate) excess fluid from the pericardium. °¨ Pericardiectomy. A pericardiectomy removes part or all of the pericardium. °HOME CARE INSTRUCTIONS  °· Do not smoke. If you smoke, quit. Your caregiver can help you quit smoking. °· Maintain a healthy weight. °· Follow an exercise program as told by your caregiver. °· If you drink alcohol, do so in moderation. °· Eat a heart healthy diet. A registered dietician can help you learn about   healthy food choices.  Keep a list of all your medicines with you at all times. Include the name,  dose, how often it is taken and how it is taken. SEEK IMMEDIATE MEDICAL CARE IF:   You have chest pain or feelings of chest pressure.  You have sweating (diaphoresis) when at rest.  You have irregular heartbeats (palpitations).  You have rapid, racing heart beats.  You have unexplained fainting episodes.  You feel sick to your stomach (nausea) or vomiting without cause.  You have unexplained weakness. If you develop any of the symptoms which originally made you seek care, call for local emergency medical help. Do not drive yourself to the hospital. Document Released: 10/14/2000 Document Revised: 07/13/2011 Document Reviewed: 04/22/2011 [facility] Patient Information 2014 [place], [place].

## 2013-07-05 LAB — SEDIMENTATION RATE: SED RATE: 73 mm/h — AB (ref 0–22)

## 2013-07-05 LAB — TSH: TSH: 0.16 u[IU]/mL — ABNORMAL LOW (ref 0.35–5.50)

## 2013-07-06 ENCOUNTER — Telehealth: Payer: Self-pay | Admitting: Internal Medicine

## 2013-07-06 MED ORDER — IBUPROFEN 800 MG PO TABS: 800.0000 mg | ORAL_TABLET | Freq: Three times a day (TID) | ORAL | Status: DC

## 2013-07-06 MED ORDER — COLCHICINE 0.6 MG PO TABS: 0.6000 mg | ORAL_TABLET | Freq: Two times a day (BID) | ORAL | Status: DC

## 2013-07-06 NOTE — Assessment & Plan Note (Addendum)
Suspect untreated pericarditis given ECHO on Jan 30th per Arida.  patient still having palpitations and chest pain consistent with pericarditis but never took NSAIDS (never got the message).  Since I am seeing her in an urgent slot and for the first time I  put her on prednisone taper for ESR 75 and will transition her  to ibuprofen  and colchine. Will suspend statin to reduce risk of myoapthy. She needs follow up with Dr Fletcher Anon. she has a history of cancer and did have a small pericardial effusion noted on the ECHO that was done in late January so this may need repeating  .

## 2013-07-06 NOTE — Telephone Encounter (Signed)
Please tell Debra Hale that since her symptoms are improving so quickly.  I want to transition her off of predniosne and onto Diclofenac and colchicine which are more effective long term for treatment of pericarditis  than  prednisone and reduce the risk of the pericarditis recurring. I will call in the two medications for her to start tomorrow and she can stop the prednisone after today's dose.   I also think that since her symptoms returned, she should see Dr Fletcher Anon as he advised her initially. (per office notes).  I will have amber set that up.

## 2013-07-06 NOTE — Telephone Encounter (Signed)
Ok. Thanks!

## 2013-07-06 NOTE — Telephone Encounter (Signed)
See other note regarding this patient also

## 2013-07-06 NOTE — Telephone Encounter (Signed)
Patient still having palpitations and chest pain consistent with pericarditis but never took NSAIDS (never got the message) I saw her for first time and put her on prednisone taper for ESR 75 and trnasitioning her to diclofenac and colchine. She needs follow up with Dr Arida. Can you schedule?she has a history of cancer and did have a small pericardial effusion noted on the ECHO he did in late January . 

## 2013-07-06 NOTE — Telephone Encounter (Signed)
I have tried to reach this patient 3 times with no success. Pt mailbox is full & can not leave a message. Will forward this message to [facility] for her try again tomorrow.

## 2013-07-06 NOTE — Telephone Encounter (Signed)
Change in therapy:  ibuprofen and colchicine called in.  (review of literature suggests better combination) NEEDS TO SUSPEND SIMVASTATIN FOR A MONTH TO AVOID MUSCLE PAIN FROM CONCURRENT USE OF COLCHICINE.  Amber to set up follow up with [place].

## 2013-07-07 NOTE — Telephone Encounter (Signed)
Tried t reach patient this morning no answer voicemail full.

## 2013-07-07 NOTE — Telephone Encounter (Signed)
Patient a ware and pickup medication

## 2013-07-07 NOTE — Telephone Encounter (Signed)
Pt notified of instructions & will pick up & start on medications today. Pt also aware of appt with Dr. Nicki Reaper on Tuesday. And aware that Amber will be calling her soon for referral

## 2013-07-10 NOTE — Telephone Encounter (Signed)
Pt aware of apt with Dr. Fletcher Anon, cancelling apt with Dr. Nicki Reaper. Toya aware.

## 2013-07-10 NOTE — Telephone Encounter (Signed)
Attempted to reach pt, her mailbox is full and can't take any more messages

## 2013-07-10 NOTE — Telephone Encounter (Signed)
Apt scheduled with Arida on 07/11/13 at 215

## 2013-07-11 ENCOUNTER — Ambulatory Visit (INDEPENDENT_AMBULATORY_CARE_PROVIDER_SITE_OTHER): Payer: Medicare Other | Admitting: Cardiovascular Disease

## 2013-07-11 ENCOUNTER — Encounter: Payer: Self-pay | Admitting: Cardiovascular Disease

## 2013-07-11 ENCOUNTER — Ambulatory Visit: Payer: Medicare Other | Admitting: Internal Medicine

## 2013-07-11 VITALS — BP 115/79 | HR 78 | Ht 64.0 in | Wt 147.0 lb

## 2013-07-11 DIAGNOSIS — I309 Acute pericarditis, unspecified: Secondary | ICD-10-CM

## 2013-07-11 DIAGNOSIS — R079 Chest pain, unspecified: Secondary | ICD-10-CM

## 2013-07-11 NOTE — Assessment & Plan Note (Signed)
The patient recurrent chest pain likely due to pericarditis. She reports complete resolution of symptoms at this time. I recommend stopping ibuprofen and continuing treatment with colchicine for a total of one month. I agree with avoiding steroids. She has no pericardial rub and no EKG changes. There is no physical exam findings of significant pericardial effusion. Also on recent CT scan, the effusion was only small. I will have her followup with me in 3 months to make sure she is not having any relapsing symptoms.

## 2013-07-11 NOTE — Progress Notes (Signed)
Primary care physician: Dr. [name]  HPI  This is a pleasant 63 year old Caucasian female who is here today for a followup visit regarding chest pain due to suspected pericarditis.  She has known history of treated hyperlipidemia. There is no history of hypertension, diabetes, tobacco use and no family history of coronary artery disease. Other medical conditions include thyroid carcinoma s/p surgery and XRT, colon cancer s/p laparoscopic right colectomy followed by Dr Ma Hillock, nephrolithiasis, anxiety, osteoporosis and breast cancer. She was seen in January for intermittent pleuritic chest pain with associated palpitations. She had symptoms suggestive of sinusitis as well . D-dimer was mildly elevated. CTA of the chest showed no evidence of pulmonary embolism. Coronary calcifications were noted. An echocardiogram showed normal LV systolic function with trace pericardial effusion. Mild case of pericarditis was suspected. She was taking Advil with subsequent improvement in her symptoms.  However, she had worsening chest pain on February 28 and went to the emergency room at [facility]. Sedimentation rate was elevated at 75. CTA of the chest showed no and embolism. Small pericardial effusion was noted. She was subsequently seen by Dr. Derrel Nip. She was started on high-dose ibuprofen and colchicine. She reports that her symptoms were actually improved before she was started on these medications. She reports complete resolution of her symptoms now.   Allergies  Allergen Reactions  . Bentyl [Dicyclomine Hcl]   . Ciprofloxacin Diarrhea  . Codeine Other (See Comments)    dizziness    . Epinephrine   . Flagyl [Metronidazole]   . Librax [Chlordiazepoxide-Clidinium]   . Phenobarbital   . Tetracyclines & Related Other (See Comments)    Throat swells   . Ultram [Tramadol] Other (See Comments)    Sick feeling     Current Outpatient Prescriptions on File Prior to Visit  Medication Sig Dispense Refill   . bismuth subsalicylate (PEPTO-BISMOL) 262 MG/15ML suspension Take 15 mLs by mouth every 6 (six) hours as needed.      . clonazePAM (KLONOPIN) 1 MG tablet Take 1 mg by mouth 2 (two) times daily as needed.        . colchicine 0.6 MG tablet Take 1 tablet (0.6 mg total) by mouth 2 (two) times daily.  60 tablet  1  . escitalopram (LEXAPRO) 20 MG tablet Take 20 mg by mouth daily.        Marland Kitchen exemestane (AROMASIN) 25 MG tablet Take 25 mg by mouth daily.      Marland Kitchen ibuprofen (ADVIL,MOTRIN) 800 MG tablet Take 1 tablet (800 mg total) by mouth 3 (three) times daily.  90 tablet  0  . levothyroxine (SYNTHROID) 100 MCG tablet Take 1 tablet (100 mcg total) by mouth daily before breakfast.  90 tablet  3  . pantoprazole (PROTONIX) 40 MG tablet TAKE 1 TABLET BY MOUTH  DAILY.       No current facility-administered medications on file prior to visit.     Past Medical History  Diagnosis Date  . Palpitations   . Other and unspecified hyperlipidemia   . Unspecified hereditary and idiopathic peripheral neuropathy   . Personal history of malignant neoplasm of large intestine     carcinoma - cecum, s/p right laparoscopic colectomy - s/p chemotherapy and XRT  . Anxiety   . Liver nodule     s/p negative biopsy  . Nephrolithiasis     s/p lithotripsy  . GERD (gastroesophageal reflux disease)   . Osteoporosis   . Malignant neoplasm of breast (female), unspecified site  s/p lumpectomy  . Malignant neoplasm of thyroid gland     s/p surgery and XRT     Past Surgical History  Procedure Laterality Date  . Appendectomy  1985  . Cholecystectomy  1995  . Thyroid lobectomy  2002    s/p XRT  . Laparoscopic partial colectomy      stage 3-C carcinoma of the cecum, s/p chemotherapy and xrt  . Lithotripsy    . Dilation and curettage of uterus  1990  . Breast lumpectomy  2009    breast cancer     Family History  Problem Relation Age of Onset  . Stroke Mother     52s  . Lung cancer Father   . Prostate cancer  Father   . Breast cancer Sister   . Lung cancer Sister   . Breast cancer      aunt     History   Social History  . Marital Status: Divorced    Spouse Name: N/A    Number of Children: 0  . Years of Education: N/A   Occupational History  . Not on file.   Social History Main Topics  . Smoking status: Never Smoker   . Smokeless tobacco: Never Used  . Alcohol Use: No  . Drug Use: No  . Sexual Activity: Not on file   Other Topics Concern  . Not on file   Social History Narrative  . No narrative on file     ROS A 10 point review of system was performed. It is negative other than that mentioned in the history of present illness.   PHYSICAL EXAM   BP 115/79  Pulse 78  Ht 5\' 4"  (1.626 m)  Wt 147 lb (66.679 kg)  BMI 25.22 kg/m2 Constitutional: She is oriented to person, place, and time. She appears well-developed and well-nourished. No distress.  HENT: No nasal discharge.  Head: Normocephalic and atraumatic.  Eyes: Pupils are equal and round. No discharge.  Neck: Normal range of motion. Neck supple. No JVD present. No thyromegaly present.  Cardiovascular: Tachycardic, regular rhythm, normal heart sounds. Exam reveals no gallop and no friction rub. No murmur heard.  Pulmonary/Chest: Effort normal and breath sounds normal. No stridor. No respiratory distress. She has no wheezes. She has no rales. She exhibits no tenderness.  Abdominal: Soft. Bowel sounds are normal. She exhibits no distension. There is no tenderness. There is no rebound and no guarding.  Musculoskeletal: Normal range of motion. She exhibits no edema and no tenderness.  Neurological: She is alert and oriented to person, place, and time. Coordination normal.  Skin: Skin is warm and dry. No rash noted. She is not diaphoretic. No erythema. No pallor.  Psychiatric: She has a normal mood and affect. Her behavior is normal. Judgment and thought content normal.     EKG: Sinus  Rhythm  -Prominent R(V1)  -nonspecific.   BORDERLINE    ASSESSMENT AND PLAN

## 2013-07-11 NOTE — Patient Instructions (Signed)
Stop Ibuprofen.   Continue Colchicine for a total of 1 month.   Follow up 3 months.

## 2013-07-20 ENCOUNTER — Telehealth: Payer: Self-pay | Admitting: Internal Medicine

## 2013-07-20 NOTE — Telephone Encounter (Signed)
Patient Information:  Caller Name: Ebbie  Phone: [phone]  Patient: Debra Hale  Gender: Female  DOB: November 22, 1950  Age: 63 Years  PCP: Deborra Medina (Adults only)  Office Follow Up:  Does the office need to follow up with this patient?: No  Instructions For The Office: N/A  RN Note:  UC called her to confirm positive urine culture 07/17/13.  Stopped antibiotic after 3 days due to side effects. Urinary sympoms improved; continues to have mild dysuria without frequency or urgency. Too late for appointment 07/20/13. Offered UC now. Prefers to be seen 07/21/13.  Symptoms  Reason For Call & Symptoms: Stopped antibiotic for UTI after 3 days due to nausea and moderate diarrhea. No vomiting.  Was put on Septra for UTI 07/14/13 at [facility] 07/18/13.  Nausea resolved and diarrhea lessened.  Also on Colchicine since 07/06/13. Had two loose stools so far 07/20/13  Reviewed Health History In EMR: Yes  Reviewed Medications In EMR: Yes  Reviewed Allergies In EMR: Yes  Reviewed Surgeries / Procedures: Yes  Date of Onset of Symptoms: 07/17/2013  Treatments Tried: Stopped taking Septra, Pepto-Bismol  Treatments Tried Worked: Yes  Guideline(s) Used:  Urination Pain - Female  Disposition Per Guideline:   See Today in Office  Reason For Disposition Reached:   Age > 50 years  Advice Given:  Fluids:   Drink extra fluids. Drink 8-10 glasses of liquids a day (Reason: to produce a dilute, non-irritating urine).  Fluids:   Drink extra fluids. Drink 8-10 glasses of liquids a day (Reason: to produce a dilute, non-irritating urine).  Call Back If:  You become worse.  Patient Will Follow Care Advice:  YES  Appointment Scheduled:  07/21/2013 11:15:00 Appointment Scheduled Provider:  Deborra Medina (Adults only)

## 2013-07-20 NOTE — Telephone Encounter (Signed)
Appt with Dr. Derrel Nip scheduled per CAN for tomorrow

## 2013-07-21 ENCOUNTER — Ambulatory Visit (INDEPENDENT_AMBULATORY_CARE_PROVIDER_SITE_OTHER): Payer: Medicare Other | Admitting: Internal Medicine

## 2013-07-21 ENCOUNTER — Encounter: Payer: Self-pay | Admitting: Internal Medicine

## 2013-07-21 VITALS — BP 110/80 | HR 81 | Temp 97.9°F | Ht 64.0 in | Wt 148.0 lb

## 2013-07-21 DIAGNOSIS — I309 Acute pericarditis, unspecified: Secondary | ICD-10-CM

## 2013-07-21 DIAGNOSIS — N390 Urinary tract infection, site not specified: Secondary | ICD-10-CM

## 2013-07-21 LAB — POCT URINALYSIS DIPSTICK
Bilirubin, UA: NEGATIVE
GLUCOSE UA: NEGATIVE
KETONES UA: NEGATIVE
Nitrite, UA: NEGATIVE
PROTEIN UA: NEGATIVE
SPEC GRAV UA: 1.015
Urobilinogen, UA: 0.2
pH, UA: 5

## 2013-07-23 ENCOUNTER — Encounter: Payer: Self-pay | Admitting: Internal Medicine

## 2013-07-23 DIAGNOSIS — N390 Urinary tract infection, site not specified: Secondary | ICD-10-CM | POA: Insufficient documentation

## 2013-07-23 LAB — CULTURE, URINE COMPREHENSIVE

## 2013-07-23 NOTE — Assessment & Plan Note (Signed)
Chest pain resolved

## 2013-07-23 NOTE — Assessment & Plan Note (Signed)
Symptoms have resolved.  She did not complete her abx.  Recheck urine to confirm infection cleared.

## 2013-07-23 NOTE — Progress Notes (Signed)
Subjective:    Patient ID: Debra Hale, female    DOB: [birth year], 63 y.o.   MRN: [id number]  Urinary Tract Infection   63 year old female with past history of thyroid carcinoma s/p surgery and XRT, colon cancer s/p laparoscopic right colectomy followed by Dr Ma Hillock, nephrolithiasis, anxiety, osteoporosis and breast cancer who comes in today as a work in to confirm her uti cleared.  She was seen in [facility] on 07/13/13 and diagnosed with UTI.  Treated with a sulfa abx.  Took the abx for three days.  Caused nausea and diarrhea.  This resolved after quitting the sulfa.  Eating and drinking well now.  No nausea or vomiting.  No diarrhea.  No urinary symptoms now.     Past Medical History  Diagnosis Date  . Palpitations   . Other and unspecified hyperlipidemia   . Unspecified hereditary and idiopathic peripheral neuropathy   . Personal history of malignant neoplasm of large intestine     carcinoma - cecum, s/p right laparoscopic colectomy - s/p chemotherapy and XRT  . Anxiety   . Liver nodule     s/p negative biopsy  . Nephrolithiasis     s/p lithotripsy  . GERD (gastroesophageal reflux disease)   . Osteoporosis   . Malignant neoplasm of breast (female), unspecified site     s/p lumpectomy  . Malignant neoplasm of thyroid gland     s/p surgery and XRT    Current Outpatient Prescriptions on File Prior to Visit  Medication Sig Dispense Refill  . Azelastine-Fluticasone 137-50 MCG/ACT SUSP Place 1 spray into the nose as needed.      . bismuth subsalicylate (PEPTO-BISMOL) 262 MG/15ML suspension Take 15 mLs by mouth every 6 (six) hours as needed.      . clonazePAM (KLONOPIN) 1 MG tablet Take 1 mg by mouth 2 (two) times daily as needed.        . colchicine 0.6 MG tablet Take 1 tablet (0.6 mg total) by mouth 2 (two) times daily.  60 tablet  1  . escitalopram (LEXAPRO) 20 MG tablet Take 20 mg by mouth daily.        Marland Kitchen exemestane (AROMASIN) 25 MG tablet Take 25 mg by mouth daily.      Marland Kitchen  levothyroxine (SYNTHROID) 100 MCG tablet Take 1 tablet (100 mcg total) by mouth daily before breakfast.  90 tablet  3  . pantoprazole (PROTONIX) 40 MG tablet TAKE 1 TABLET BY MOUTH  DAILY.       No current facility-administered medications on file prior to visit.    Review of Systems No chest pain.  Resolved.  No nausea or vomiting.  Eating and drinking well.  No abdominal pain or cramping.  No vaginal symptoms.  No dysuria or hematuria.  No increased urinary frequency.         Objective:   Physical Exam  Filed Vitals:   07/21/13 1110  BP: 110/80  Pulse: 81  Temp: 97.9 F (82.28 C)   63 year old female in no acute distress.  HEART:  Appears to be regular. LUNGS:  No crackles or wheezing audible.  Respirations even and unlabored.  ABDOMEN:  Soft.  Non tender.  No audible abdominal bruit.  BACK:  Non tender.  No CVA tenderness.              Assessment & Plan:  HEALTH MAINTENANCE.  Pap negative 05/30/12.   Mammograms through Dr Candelaria Stagers office.  States has mammogram scheduled.  Colonoscopy  05/18/11 revealed internal hemorrhoids.  Colonoscopy was repeated 4/13.  No evidence of colitis.

## 2013-07-25 ENCOUNTER — Other Ambulatory Visit: Payer: Self-pay | Admitting: Internal Medicine

## 2013-07-25 DIAGNOSIS — R739 Hyperglycemia, unspecified: Secondary | ICD-10-CM

## 2013-07-25 DIAGNOSIS — D649 Anemia, unspecified: Secondary | ICD-10-CM

## 2013-07-25 NOTE — Progress Notes (Signed)
Orders placed for f/u labs.  

## 2013-07-26 ENCOUNTER — Other Ambulatory Visit: Payer: Self-pay | Admitting: *Deleted

## 2013-07-26 MED ORDER — CEPHALEXIN 500 MG PO CAPS: 500.0000 mg | ORAL_CAPSULE | Freq: Three times a day (TID) | ORAL | Status: DC

## 2013-08-10 ENCOUNTER — Ambulatory Visit: Payer: Self-pay | Admitting: Internal Medicine

## 2013-08-25 ENCOUNTER — Ambulatory Visit (INDEPENDENT_AMBULATORY_CARE_PROVIDER_SITE_OTHER): Payer: Medicare Other | Admitting: Internal Medicine

## 2013-08-25 ENCOUNTER — Encounter: Payer: Self-pay | Admitting: Internal Medicine

## 2013-08-25 ENCOUNTER — Encounter (INDEPENDENT_AMBULATORY_CARE_PROVIDER_SITE_OTHER): Payer: Self-pay

## 2013-08-25 VITALS — BP 124/80 | HR 78 | Temp 97.9°F | Ht 64.5 in | Wt 147.8 lb

## 2013-08-25 DIAGNOSIS — Z9109 Other allergy status, other than to drugs and biological substances: Secondary | ICD-10-CM

## 2013-08-25 DIAGNOSIS — R7989 Other specified abnormal findings of blood chemistry: Secondary | ICD-10-CM

## 2013-08-25 DIAGNOSIS — G609 Hereditary and idiopathic neuropathy, unspecified: Secondary | ICD-10-CM

## 2013-08-25 DIAGNOSIS — K219 Gastro-esophageal reflux disease without esophagitis: Secondary | ICD-10-CM

## 2013-08-25 DIAGNOSIS — C50919 Malignant neoplasm of unspecified site of unspecified female breast: Secondary | ICD-10-CM

## 2013-08-25 DIAGNOSIS — M810 Age-related osteoporosis without current pathological fracture: Secondary | ICD-10-CM

## 2013-08-25 DIAGNOSIS — N898 Other specified noninflammatory disorders of vagina: Secondary | ICD-10-CM

## 2013-08-25 DIAGNOSIS — F419 Anxiety disorder, unspecified: Secondary | ICD-10-CM

## 2013-08-25 DIAGNOSIS — C73 Malignant neoplasm of thyroid gland: Secondary | ICD-10-CM

## 2013-08-25 DIAGNOSIS — I309 Acute pericarditis, unspecified: Secondary | ICD-10-CM

## 2013-08-25 DIAGNOSIS — R319 Hematuria, unspecified: Secondary | ICD-10-CM

## 2013-08-25 DIAGNOSIS — R928 Other abnormal and inconclusive findings on diagnostic imaging of breast: Secondary | ICD-10-CM

## 2013-08-25 DIAGNOSIS — N899 Noninflammatory disorder of vagina, unspecified: Secondary | ICD-10-CM

## 2013-08-25 DIAGNOSIS — F411 Generalized anxiety disorder: Secondary | ICD-10-CM

## 2013-08-25 DIAGNOSIS — R002 Palpitations: Secondary | ICD-10-CM

## 2013-08-25 DIAGNOSIS — Z85038 Personal history of other malignant neoplasm of large intestine: Secondary | ICD-10-CM

## 2013-08-25 DIAGNOSIS — R945 Abnormal results of liver function studies: Secondary | ICD-10-CM

## 2013-08-25 DIAGNOSIS — E785 Hyperlipidemia, unspecified: Secondary | ICD-10-CM

## 2013-08-25 NOTE — Progress Notes (Signed)
Pre visit review using our clinic review tool, if applicable. No additional management support is needed unless otherwise documented below in the visit note. 

## 2013-08-26 LAB — WET PREP BY MOLECULAR PROBE
CANDIDA SPECIES: NEGATIVE
Gardnerella vaginalis: NEGATIVE
TRICHOMONAS VAG: NEGATIVE

## 2013-08-27 ENCOUNTER — Encounter: Payer: Self-pay | Admitting: Internal Medicine

## 2013-08-27 DIAGNOSIS — N898 Other specified noninflammatory disorders of vagina: Secondary | ICD-10-CM | POA: Insufficient documentation

## 2013-08-27 NOTE — Assessment & Plan Note (Signed)
Does well on dymista.    

## 2013-08-27 NOTE — Assessment & Plan Note (Signed)
Has been seeing Dr Clovis Riley.  Increased stress recently.  Plans to contact Dr Clovis Riley.

## 2013-08-27 NOTE — Assessment & Plan Note (Signed)
Just had colonoscopy 4/13.  Bowels better.

## 2013-08-27 NOTE — Assessment & Plan Note (Signed)
Chest pain resolved

## 2013-08-27 NOTE — Assessment & Plan Note (Signed)
12/22/12 abdominal ultrasound revealed fatty liver.  Repeat liver panel.  Given her history, if liver enzymes continue to be elevated  - referred to GI for evaluation.

## 2013-08-27 NOTE — Assessment & Plan Note (Signed)
Wet prep sent.  Await results.

## 2013-08-27 NOTE — Progress Notes (Signed)
Subjective:    Patient ID: Debra Hale, female    DOB: Sep 18, 1950, 63 y.o.   MRN: [id number]  HPI 63 year old female with past history of thyroid carcinoma s/p surgery and XRT, colon cancer s/p laparoscopic right colectomy followed by Dr Ma Hillock, nephrolithiasis, anxiety, osteoporosis and breast cancer who comes in today for a scheduled follow up.   States she has been under increased stress recently.  We discussed this at length today. She plans to call Dr Clovis Riley to discuss.  Breathing stable.  Dymista works well for her.   Eating and drinking well.  No acid reflux.  No abdominal pain or cramping.  Bowels stable.  Recently had issues with chest discomfort.  Was evaluated in the ER and by cardiology.  Diagnosed with probable pericarditis.  Better now.  No chest discomfort now.  No nausea or vomiting.  Had an abnormal mammogram recently.  Recommended a 6 month f/u.  She is uncomfortable with this.  Request to see Dr Tamala Julian for his opinion regarding a possible biopsy.      Past Medical History  Diagnosis Date  . Palpitations   . Other and unspecified hyperlipidemia   . Unspecified hereditary and idiopathic peripheral neuropathy   . Personal history of malignant neoplasm of large intestine     carcinoma - cecum, s/p right laparoscopic colectomy - s/p chemotherapy and XRT  . Anxiety   . Liver nodule     s/p negative biopsy  . Nephrolithiasis     s/p lithotripsy  . GERD (gastroesophageal reflux disease)   . Osteoporosis   . Malignant neoplasm of breast (female), unspecified site     s/p lumpectomy  . Malignant neoplasm of thyroid gland     s/p surgery and XRT    Current Outpatient Prescriptions on File Prior to Visit  Medication Sig Dispense Refill  . Azelastine-Fluticasone 137-50 MCG/ACT SUSP Place 1 spray into the nose as needed.      . bismuth subsalicylate (PEPTO-BISMOL) 262 MG/15ML suspension Take 15 mLs by mouth every 6 (six) hours as needed.      . clonazePAM (KLONOPIN) 1 MG tablet  Take 1 mg by mouth 2 (two) times daily as needed.        Marland Kitchen escitalopram (LEXAPRO) 20 MG tablet Take 20 mg by mouth daily.        Marland Kitchen exemestane (AROMASIN) 25 MG tablet Take 25 mg by mouth daily.      Marland Kitchen levothyroxine (SYNTHROID) 100 MCG tablet Take 1 tablet (100 mcg total) by mouth daily before breakfast.  90 tablet  3  . pantoprazole (PROTONIX) 40 MG tablet TAKE 1 TABLET BY MOUTH  DAILY.       No current facility-administered medications on file prior to visit.    Review of Systems No problems with headaches reported today.   No lightheadedness or dizziness.  Allergy symptoms better on Dyamista.  No chest pain, tightness.  No palpitations.  No increased shortness of breath, cough or congestion.  No nausea or vomiting.  No acid reflux.   No acute abdominal pain or change in symptoms.   No bowel change, such as diarrhea, constipation, BRBPR or melana.  Bowels better.  Mammogram as outlined.  Recommend a 6 month f/u.  Desires second opinion.   She also reports some vaginal irritation and a "bad smell".        Objective:   Physical Exam  Filed Vitals:   08/25/13 1106  BP: 124/80  Pulse: 78  Temp:  97.9 F (36.6 C)   Blood pressure recheck:  61/4  63 year old female in no acute distress.   HEENT:  Nares- clear.  Oropharynx - without lesions. NECK:  Supple.  Nontender.  No audible bruit.  HEART:  Appears to be regular. LUNGS:  No crackles or wheezing audible.  Respirations even and unlabored.  RADIAL PULSE:  Equal bilaterally.  ABDOMEN:  Soft.  Non tender.  No audible abdominal bruit. GU:  Normal external genitalia.  Vaginal vault without lesions.  KOH/wet prep sent.  Could not appreciate any adnexal masses or tenderness.       EXTREMITIES:  No increased edema present.  DP pulses palpable and equal bilaterally.             Assessment & Plan:  INCREASED PSYCHOSOCIAL STRESSORS.  On Lexapro and Clonazepam.  Seeing Dr Clovis Riley.   Discussed increased stress at length today.  She plans to call  Dr Clovis Riley and discuss.    HEALTH MAINTENANCE.  Pap negative 05/30/12.   Mammograms through Dr Candelaria Stagers office.  Mammogram as outlined.  Colonoscopy 05/18/11 revealed internal hemorrhoids.  Colonoscopy was repeated 4/13.  No evidence of colitis.    I spent 25 minutes with the patient and more than 50% of the time was spent in consultation regarding the above.

## 2013-08-27 NOTE — Assessment & Plan Note (Signed)
Low cholesterol diet and exercise.  Follow lipid panel and liver function.  On simvastatin.   

## 2013-08-27 NOTE — Assessment & Plan Note (Signed)
Recent bone density as outlined previously.   On aromasin.   Discussed treatment options.  Pt was agreeable to reclast.  Continue vitamin D.

## 2013-08-27 NOTE — Assessment & Plan Note (Signed)
Symptoms appear to be controlled on current regimen.  Follow.   

## 2013-08-27 NOTE — Assessment & Plan Note (Signed)
Have resolved.  Follow.

## 2013-08-27 NOTE — Assessment & Plan Note (Signed)
On synthroid.  Had recent quantitative thyroglobulin and anti-thyroglobulin antibody.  Have kept her tsh suppressed previously.  She was having increased palpitations.  Adjusted synthroid.  TSH last checked and within the normal range, but would like to keep a little more suppressed.  Adjusted synthroid to 100mcg (1/2 tab) one day per week and then continue on 100mcg q day all other days.  Follow tsh.    

## 2013-08-27 NOTE — Assessment & Plan Note (Signed)
Followed by Dr Ma Hillock.  On aromasin.  Up to date with mammograms.  Recent mammogram recommended a f/u 6 month mammo.  She prefers to get a second opinion.  Request referral to Dr Tamala Julian for evaluation with question of need for biopsy.

## 2013-08-27 NOTE — Assessment & Plan Note (Signed)
Noticed since her chemo treatment.  Stable.   

## 2013-08-27 NOTE — Assessment & Plan Note (Signed)
Was referred to Dr Wolff for further w/up.  Was treated recently for uti.  Needs f/u urinalysis to confirm no blood.    Will need referral back to urology if persistent hematuria.    

## 2013-08-30 ENCOUNTER — Other Ambulatory Visit (INDEPENDENT_AMBULATORY_CARE_PROVIDER_SITE_OTHER): Payer: Medicare Other

## 2013-08-30 DIAGNOSIS — C73 Malignant neoplasm of thyroid gland: Secondary | ICD-10-CM

## 2013-08-30 DIAGNOSIS — C50919 Malignant neoplasm of unspecified site of unspecified female breast: Secondary | ICD-10-CM

## 2013-08-30 DIAGNOSIS — R7309 Other abnormal glucose: Secondary | ICD-10-CM

## 2013-08-30 DIAGNOSIS — R739 Hyperglycemia, unspecified: Secondary | ICD-10-CM

## 2013-08-30 DIAGNOSIS — D649 Anemia, unspecified: Secondary | ICD-10-CM

## 2013-08-30 DIAGNOSIS — E785 Hyperlipidemia, unspecified: Secondary | ICD-10-CM

## 2013-08-30 DIAGNOSIS — Z85038 Personal history of other malignant neoplasm of large intestine: Secondary | ICD-10-CM

## 2013-08-30 LAB — IBC PANEL
IRON: 95 ug/dL (ref 42–145)
Saturation Ratios: 23.3 % (ref 20.0–50.0)
TRANSFERRIN: 291.8 mg/dL (ref 212.0–360.0)

## 2013-08-30 LAB — CBC WITH DIFFERENTIAL/PLATELET
BASOS ABS: 0 10*3/uL (ref 0.0–0.1)
BASOS PCT: 0.2 % (ref 0.0–3.0)
EOS ABS: 0.1 10*3/uL (ref 0.0–0.7)
Eosinophils Relative: 2.6 % (ref 0.0–5.0)
HCT: 41.1 % (ref 36.0–46.0)
Hemoglobin: 14 g/dL (ref 12.0–15.0)
LYMPHS PCT: 22.4 % (ref 12.0–46.0)
Lymphs Abs: 1 10*3/uL (ref 0.7–4.0)
MCHC: 34.1 g/dL (ref 30.0–36.0)
MCV: 88.7 fl (ref 78.0–100.0)
Monocytes Absolute: 0.3 10*3/uL (ref 0.1–1.0)
Monocytes Relative: 6.2 % (ref 3.0–12.0)
NEUTROS PCT: 68.6 % (ref 43.0–77.0)
Neutro Abs: 3.1 10*3/uL (ref 1.4–7.7)
Platelets: 203 10*3/uL (ref 150.0–400.0)
RBC: 4.64 Mil/uL (ref 3.87–5.11)
RDW: 15 % — ABNORMAL HIGH (ref 11.5–14.6)
WBC: 4.5 10*3/uL (ref 4.5–10.5)

## 2013-08-30 LAB — BASIC METABOLIC PANEL
BUN: 18 mg/dL (ref 6–23)
CO2: 28 mEq/L (ref 19–32)
Calcium: 9.2 mg/dL (ref 8.4–10.5)
Chloride: 108 mEq/L (ref 96–112)
Creatinine, Ser: 0.9 mg/dL (ref 0.4–1.2)
GFR: 66.19 mL/min (ref >60.00)
Glucose, Bld: 79 mg/dL (ref 70–99)
Potassium: 3.8 mEq/L (ref 3.5–5.1)
SODIUM: 143 meq/L (ref 135–145)

## 2013-08-30 LAB — HEPATIC FUNCTION PANEL
ALK PHOS: 80 U/L (ref 39–117)
ALT: 38 U/L — ABNORMAL HIGH (ref 0–35)
AST: 19 U/L (ref 0–37)
Albumin: 3.9 g/dL (ref 3.5–5.2)
BILIRUBIN DIRECT: 0.1 mg/dL (ref 0.0–0.3)
Total Bilirubin: 0.8 mg/dL (ref 0.3–1.2)
Total Protein: 6.4 g/dL (ref 6.0–8.3)

## 2013-08-30 LAB — LIPID PANEL
CHOL/HDL RATIO: 4
CHOLESTEROL: 147 mg/dL (ref 0–200)
HDL: 41 mg/dL (ref >39.00)
LDL Cholesterol: 85 mg/dL (ref 0–99)
TRIGLYCERIDES: 104 mg/dL (ref 0.0–149.0)
VLDL: 20.8 mg/dL (ref 0.0–40.0)

## 2013-08-30 LAB — FERRITIN: Ferritin: 43.8 ng/mL (ref 10.0–291.0)

## 2013-08-30 LAB — HEMOGLOBIN A1C: Hgb A1c MFr Bld: 5.2 % (ref 4.6–6.5)

## 2013-08-30 LAB — TSH: TSH: 0.34 u[IU]/mL — ABNORMAL LOW (ref 0.35–5.50)

## 2013-08-30 LAB — VITAMIN B12: VITAMIN B 12: 170 pg/mL — AB (ref 211–911)

## 2013-09-05 ENCOUNTER — Ambulatory Visit (INDEPENDENT_AMBULATORY_CARE_PROVIDER_SITE_OTHER): Payer: Medicare Other | Admitting: *Deleted

## 2013-09-05 DIAGNOSIS — E538 Deficiency of other specified B group vitamins: Secondary | ICD-10-CM

## 2013-09-05 MED ORDER — CYANOCOBALAMIN 1000 MCG/ML IJ SOLN
1000.0000 ug | Freq: Once | INTRAMUSCULAR | Status: AC
  Administered 2013-09-05: 1000 ug via INTRAMUSCULAR

## 2013-09-11 ENCOUNTER — Encounter: Payer: Self-pay | Admitting: Internal Medicine

## 2013-09-11 DIAGNOSIS — C50919 Malignant neoplasm of unspecified site of unspecified female breast: Secondary | ICD-10-CM

## 2013-09-12 ENCOUNTER — Ambulatory Visit (INDEPENDENT_AMBULATORY_CARE_PROVIDER_SITE_OTHER): Payer: Medicare Other | Admitting: *Deleted

## 2013-09-12 DIAGNOSIS — E538 Deficiency of other specified B group vitamins: Secondary | ICD-10-CM

## 2013-09-12 MED ORDER — CYANOCOBALAMIN 1000 MCG/ML IJ SOLN
1000.0000 ug | Freq: Once | INTRAMUSCULAR | Status: AC
  Administered 2013-09-12: 1000 ug via INTRAMUSCULAR

## 2013-09-19 ENCOUNTER — Ambulatory Visit (INDEPENDENT_AMBULATORY_CARE_PROVIDER_SITE_OTHER): Payer: Medicare Other | Admitting: *Deleted

## 2013-09-19 DIAGNOSIS — E538 Deficiency of other specified B group vitamins: Secondary | ICD-10-CM

## 2013-09-19 MED ORDER — CYANOCOBALAMIN 1000 MCG/ML IJ SOLN
1000.0000 ug | Freq: Once | INTRAMUSCULAR | Status: AC
  Administered 2013-09-19: 1000 ug via INTRAMUSCULAR

## 2013-09-26 ENCOUNTER — Ambulatory Visit (INDEPENDENT_AMBULATORY_CARE_PROVIDER_SITE_OTHER): Payer: Medicare Other | Admitting: *Deleted

## 2013-09-26 DIAGNOSIS — E538 Deficiency of other specified B group vitamins: Secondary | ICD-10-CM

## 2013-09-26 MED ORDER — CYANOCOBALAMIN 1000 MCG/ML IJ SOLN
1000.0000 ug | Freq: Once | INTRAMUSCULAR | Status: AC
  Administered 2013-09-26: 1000 ug via INTRAMUSCULAR

## 2013-09-26 MED ORDER — CYANOCOBALAMIN 1000 MCG/ML IJ SOLN: 1000.0000 ug | Freq: Once | INTRAMUSCULAR | Status: DC

## 2013-10-17 ENCOUNTER — Ambulatory Visit: Payer: Medicare Other | Admitting: Cardiovascular Disease

## 2013-10-22 ENCOUNTER — Other Ambulatory Visit: Payer: Self-pay | Admitting: Internal Medicine

## 2013-10-30 ENCOUNTER — Ambulatory Visit: Payer: Medicare Other

## 2013-10-31 ENCOUNTER — Ambulatory Visit (INDEPENDENT_AMBULATORY_CARE_PROVIDER_SITE_OTHER): Payer: Medicare Other | Admitting: *Deleted

## 2013-10-31 DIAGNOSIS — E538 Deficiency of other specified B group vitamins: Secondary | ICD-10-CM

## 2013-10-31 MED ORDER — CYANOCOBALAMIN 1000 MCG/ML IJ SOLN
1000.0000 ug | Freq: Once | INTRAMUSCULAR | Status: AC
  Administered 2013-10-31: 1000 ug via INTRAMUSCULAR

## 2013-10-31 NOTE — Progress Notes (Signed)
Patient tolerated injection well reminded patient of appointment on 11/07/13.

## 2013-11-07 ENCOUNTER — Encounter: Payer: Self-pay | Admitting: Internal Medicine

## 2013-11-07 ENCOUNTER — Ambulatory Visit (INDEPENDENT_AMBULATORY_CARE_PROVIDER_SITE_OTHER): Payer: Medicare Other | Admitting: Internal Medicine

## 2013-11-07 VITALS — BP 130/80 | HR 88 | Temp 98.3°F | Ht 64.5 in | Wt 147.5 lb

## 2013-11-07 DIAGNOSIS — K219 Gastro-esophageal reflux disease without esophagitis: Secondary | ICD-10-CM

## 2013-11-07 DIAGNOSIS — G609 Hereditary and idiopathic neuropathy, unspecified: Secondary | ICD-10-CM

## 2013-11-07 DIAGNOSIS — Z9109 Other allergy status, other than to drugs and biological substances: Secondary | ICD-10-CM

## 2013-11-07 DIAGNOSIS — F411 Generalized anxiety disorder: Secondary | ICD-10-CM

## 2013-11-07 DIAGNOSIS — R002 Palpitations: Secondary | ICD-10-CM

## 2013-11-07 DIAGNOSIS — Z85038 Personal history of other malignant neoplasm of large intestine: Secondary | ICD-10-CM

## 2013-11-07 DIAGNOSIS — C50919 Malignant neoplasm of unspecified site of unspecified female breast: Secondary | ICD-10-CM

## 2013-11-07 DIAGNOSIS — F419 Anxiety disorder, unspecified: Secondary | ICD-10-CM

## 2013-11-07 DIAGNOSIS — R7989 Other specified abnormal findings of blood chemistry: Secondary | ICD-10-CM

## 2013-11-07 DIAGNOSIS — E785 Hyperlipidemia, unspecified: Secondary | ICD-10-CM

## 2013-11-07 DIAGNOSIS — C73 Malignant neoplasm of thyroid gland: Secondary | ICD-10-CM

## 2013-11-07 DIAGNOSIS — R945 Abnormal results of liver function studies: Secondary | ICD-10-CM

## 2013-11-07 MED ORDER — AZELASTINE-FLUTICASONE 137-50 MCG/ACT NA SUSP: NASAL | Status: DC

## 2013-11-07 NOTE — Patient Instructions (Signed)
Take align - one per day 

## 2013-11-07 NOTE — Progress Notes (Signed)
Pre visit review using our clinic review tool, if applicable. No additional management support is needed unless otherwise documented below in the visit note. 

## 2013-11-12 ENCOUNTER — Encounter: Payer: Self-pay | Admitting: Internal Medicine

## 2013-11-12 NOTE — Assessment & Plan Note (Signed)
Low cholesterol diet and exercise.  Follow lipid panel and liver function.  On simvastatin.

## 2013-11-12 NOTE — Assessment & Plan Note (Signed)
On synthroid.  Had recent quantitative thyroglobulin and anti-thyroglobulin antibody.  Have kept her tsh suppressed previously.  She was having increased palpitations.  Adjusted synthroid.  TSH last checked and within the normal range, but would like to keep a little more suppressed.  Adjusted synthroid to 115mcg (1/2 tab) one day per week and then continue on 133mcg q day all other days.  Follow tsh.

## 2013-11-12 NOTE — Assessment & Plan Note (Signed)
Noticed since her chemo treatment.  Stable.

## 2013-11-12 NOTE — Assessment & Plan Note (Signed)
Symptoms appear to be controlled on current regimen.  Follow.

## 2013-11-12 NOTE — Assessment & Plan Note (Signed)
Not a significant issue for her now.  Follow.   

## 2013-11-12 NOTE — Assessment & Plan Note (Signed)
Planning for 6 month f/u mammogram as outlined.

## 2013-11-12 NOTE — Assessment & Plan Note (Signed)
Does well on dymista.

## 2013-11-12 NOTE — Progress Notes (Signed)
Subjective:    Patient ID: Debra Hale, female    DOB: 07/07/1950, 63 y.o.   MRN: [id number]  HPI 63 year old female with past history of thyroid carcinoma s/p surgery and XRT, colon cancer s/p laparoscopic right colectomy followed by Dr Ma Hillock, nephrolithiasis, anxiety, osteoporosis and breast cancer who comes in today for a scheduled follow up.   Has been under increased stress recently.  We discussed this today. Overall appears to be doing better.  Still seeing Dr Octavia Heir.   Breathing stable.  Dymista works well for her.  Request prescription.   Eating and drinking well.  No acid reflux.  She was recently placed on tetracycline by GI.  Noticed some increased stomach swelling and bloating after starting.  Also experienced some nausea and diarrhea.  This has essentially resolved after stopping the tetracycline.  Feels better.  No significant abdominal pain or cramping now.  Recently had issues with chest discomfort.  Was evaluated in the ER and by cardiology.  Diagnosed with probable pericarditis.  Better now.  No chest discomfort now. Had an abnormal mammogram recently.  Recommended a 6 month f/u.  She was uncomfortable with this.  Requested to see Dr Tamala Julian for his opinion regarding a possible biopsy.  He recommended a 6 month f/u.       Past Medical History  Diagnosis Date  . Palpitations   . Other and unspecified hyperlipidemia   . Unspecified hereditary and idiopathic peripheral neuropathy   . Personal history of malignant neoplasm of large intestine     carcinoma - cecum, s/p right laparoscopic colectomy - s/p chemotherapy and XRT  . Anxiety   . Liver nodule     s/p negative biopsy  . Nephrolithiasis     s/p lithotripsy  . GERD (gastroesophageal reflux disease)   . Osteoporosis   . Malignant neoplasm of breast (female), unspecified site     s/p lumpectomy  . Malignant neoplasm of thyroid gland     s/p surgery and XRT    Current Outpatient Prescriptions on File Prior to Visit   Medication Sig Dispense Refill  . bismuth subsalicylate (PEPTO-BISMOL) 262 MG/15ML suspension Take 15 mLs by mouth every 6 (six) hours as needed.      . clonazePAM (KLONOPIN) 1 MG tablet Take 1 mg by mouth 2 (two) times daily as needed.        Marland Kitchen escitalopram (LEXAPRO) 20 MG tablet Take 20 mg by mouth daily.        Marland Kitchen levothyroxine (SYNTHROID) 100 MCG tablet Take 1 tablet (100 mcg total) by mouth daily before breakfast.  90 tablet  3  . pantoprazole (PROTONIX) 40 MG tablet TAKE 1 TABLET BY MOUTH  DAILY.       No current facility-administered medications on file prior to visit.    Review of Systems No problems with headaches reported today.   No lightheadedness or dizziness.  Allergy symptoms better on Dyamista.  No chest pain, tightness.  No palpitations.  No increased shortness of breath, cough or congestion.  No nausea or vomiting.  No acid reflux.   Previous abdominal pain and bloating as outlined.  Nausea and diarrhea as outlined.  Better now since off tetracycline.   No BRBPR or melana.  Mammogram as outlined.  Recommend a 6 month f/u.         Objective:   Physical Exam  Filed Vitals:   11/07/13 1504  BP: 130/80  Pulse: 88  Temp: 98.3 F (36.8 C)  63 year old female in no acute distress.   HEENT:  Nares- clear.  Oropharynx - without lesions. NECK:  Supple.  Nontender.  No audible bruit.  HEART:  Appears to be regular. LUNGS:  No crackles or wheezing audible.  Respirations even and unlabored.  RADIAL PULSE:  Equal bilaterally.  ABDOMEN:  Soft.  Non tender.  No audible abdominal bruit.      EXTREMITIES:  No increased edema present.  DP pulses palpable and equal bilaterally.             Assessment & Plan:  INCREASED PSYCHOSOCIAL STRESSORS.  On Lexapro and Clonazepam.  Seeing Dr Clovis Riley.   Appears to be stable/better.    HEALTH MAINTENANCE.  Pap negative 05/30/12.   Mammograms through Dr Candelaria Stagers office.  Mammogram as outlined.  Colonoscopy 05/18/11 revealed internal hemorrhoids.   Colonoscopy was repeated 4/13.  No evidence of colitis.    I spent 25 minutes with the patient and more than 50% of the time was spent in consultation regarding the above.

## 2013-11-12 NOTE — Assessment & Plan Note (Signed)
12/22/12 abdominal ultrasound revealed fatty liver.  Follow liver panel.  Saw GI.  Refer to Dawson Bills note for details.

## 2013-11-12 NOTE — Assessment & Plan Note (Signed)
Just had colonoscopy 4/13.  GI symptoms improved off abx.  Follow.

## 2013-11-12 NOTE — Assessment & Plan Note (Signed)
Has been seeing Dr Clovis Riley.  Increased stress recently.  Currently stable.  Appears to be doing some better.  Follow.

## 2013-11-20 ENCOUNTER — Ambulatory Visit: Payer: Self-pay | Admitting: Internal Medicine

## 2013-11-20 LAB — CBC CANCER CENTER
Basophil #: 0 x10 3/mm (ref 0.0–0.1)
Basophil %: 0.4 %
EOS ABS: 0.1 x10 3/mm (ref 0.0–0.7)
Eosinophil %: 2.3 %
HCT: 42.6 % (ref 35.0–47.0)
HGB: 14.4 g/dL (ref 12.0–16.0)
LYMPHS ABS: 0.9 x10 3/mm — AB (ref 1.0–3.6)
Lymphocyte %: 21.2 %
MCH: 29.8 pg (ref 26.0–34.0)
MCHC: 33.7 g/dL (ref 32.0–36.0)
MCV: 88 fL (ref 80–100)
Monocyte #: 0.2 x10 3/mm (ref 0.2–0.9)
Monocyte %: 5.7 %
Neutrophil #: 2.9 x10 3/mm (ref 1.4–6.5)
Neutrophil %: 70.4 %
PLATELETS: 171 x10 3/mm (ref 150–440)
RBC: 4.82 10*6/uL (ref 3.80–5.20)
RDW: 13.6 % (ref 11.5–14.5)
WBC: 4.1 x10 3/mm (ref 3.6–11.0)

## 2013-11-20 LAB — HEPATIC FUNCTION PANEL A (ARMC)
ALK PHOS: 115 U/L
AST: 31 U/L (ref 15–37)
Albumin: 3.5 g/dL (ref 3.4–5.0)
Bilirubin, Direct: <0.05 mg/dL (ref 0.00–0.20)
Bilirubin,Total: 0.5 mg/dL (ref 0.2–1.0)
SGPT (ALT): 93 U/L — ABNORMAL HIGH (ref 12–78)
Total Protein: 6.9 g/dL (ref 6.4–8.2)

## 2013-11-20 LAB — CREATININE, SERUM
CREATININE: 1.06 mg/dL (ref 0.60–1.30)
GFR CALC NON AF AMER: 55 — AB

## 2013-11-21 ENCOUNTER — Other Ambulatory Visit: Payer: Self-pay | Admitting: Internal Medicine

## 2013-11-21 LAB — CEA: CEA: 2.8 ng/mL (ref 0.0–4.7)

## 2013-11-21 LAB — CANCER ANTIGEN 27.29: CA 27.29: 18.9 U/mL (ref 0.0–38.6)

## 2013-11-30 ENCOUNTER — Emergency Department: Payer: Self-pay | Admitting: Emergency Medicine

## 2013-11-30 LAB — URINALYSIS, COMPLETE
Bacteria: NONE SEEN
Bilirubin,UR: NEGATIVE
Glucose,UR: NEGATIVE mg/dL (ref 0–75)
Hyaline Cast: 5
KETONE: NEGATIVE
Leukocyte Esterase: NEGATIVE
NITRITE: NEGATIVE
PH: 5 (ref 4.5–8.0)
Specific Gravity: 1.023 (ref 1.003–1.030)

## 2013-11-30 LAB — COMPREHENSIVE METABOLIC PANEL
ALT: 71 U/L — AB
Albumin: 3.3 g/dL — ABNORMAL LOW (ref 3.4–5.0)
Alkaline Phosphatase: 114 U/L
Anion Gap: 7 (ref 7–16)
BUN: 15 mg/dL (ref 7–18)
Bilirubin,Total: 0.7 mg/dL (ref 0.2–1.0)
CREATININE: 1.04 mg/dL (ref 0.60–1.30)
Calcium, Total: 8.7 mg/dL (ref 8.5–10.1)
Chloride: 109 mmol/L — ABNORMAL HIGH (ref 98–107)
Co2: 29 mmol/L (ref 21–32)
EGFR (African American): >60
EGFR (Non-African Amer.): 57 — ABNORMAL LOW
GLUCOSE: 97 mg/dL (ref 65–99)
Osmolality: 289 (ref 275–301)
Potassium: 3.5 mmol/L (ref 3.5–5.1)
SGOT(AST): 28 U/L (ref 15–37)
SODIUM: 145 mmol/L (ref 136–145)
Total Protein: 7.1 g/dL (ref 6.4–8.2)

## 2013-11-30 LAB — CBC
HCT: 41.4 % (ref 35.0–47.0)
HGB: 13.9 g/dL (ref 12.0–16.0)
MCH: 29.8 pg (ref 26.0–34.0)
MCHC: 33.5 g/dL (ref 32.0–36.0)
MCV: 89 fL (ref 80–100)
Platelet: 173 10*3/uL (ref 150–440)
RBC: 4.65 10*6/uL (ref 3.80–5.20)
RDW: 13.7 % (ref 11.5–14.5)
WBC: 4.8 10*3/uL (ref 3.6–11.0)

## 2013-11-30 LAB — TROPONIN I

## 2013-11-30 LAB — CK TOTAL AND CKMB (NOT AT ARMC): CK, Total: 57 U/L

## 2013-12-02 ENCOUNTER — Ambulatory Visit: Payer: Self-pay | Admitting: Internal Medicine

## 2013-12-04 ENCOUNTER — Ambulatory Visit (INDEPENDENT_AMBULATORY_CARE_PROVIDER_SITE_OTHER): Payer: Medicare Other | Admitting: Cardiovascular Disease

## 2013-12-04 ENCOUNTER — Encounter: Payer: Self-pay | Admitting: Cardiovascular Disease

## 2013-12-04 VITALS — BP 106/84 | HR 83 | Ht 64.0 in | Wt 149.0 lb

## 2013-12-04 DIAGNOSIS — I309 Acute pericarditis, unspecified: Secondary | ICD-10-CM

## 2013-12-04 DIAGNOSIS — R079 Chest pain, unspecified: Secondary | ICD-10-CM

## 2013-12-04 MED ORDER — COLCHICINE 0.6 MG PO TABS: 0.6000 mg | ORAL_TABLET | Freq: Every day | ORAL | Status: DC

## 2013-12-04 NOTE — Progress Notes (Signed)
Primary care physician: Dr. 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  She has known history of hyperlipidemia. There is no history of hypertension, diabetes, tobacco use and no family history of coronary artery disease. Other medical conditions include thyroid carcinoma s/p surgery and XRT, colon cancer s/p laparoscopic right colectomy followed by Dr Ma Hillock, nephrolithiasis, anxiety, osteoporosis and breast cancer. She was seen in January for intermittent pleuritic chest pain with associated palpitations. She had symptoms suggestive of sinusitis as well . D-dimer was mildly elevated. CTA of the chest showed no evidence of pulmonary embolism. Coronary calcifications were noted. An echocardiogram showed normal LV systolic function with trace pericardial effusion. Mild case of pericarditis was suspected. She was taking Advil with subsequent improvement in her symptoms.  However, she had worsening chest pain on February 28 and went to the emergency room at [facility]. Sedimentation rate was elevated at 75. CTA of the chest showed no and embolism. Small pericardial effusion was noted. She was treated with NSAIDs and colchicine for one month. She had another episode last week a very similar nature. She went to the emergency room at [facility]. EKG shows no acute changes. She was discharged home on naproxen with resolution of symptoms after a few days.   Allergies  Allergen Reactions  . Bentyl [Dicyclomine Hcl]   . Ciprofloxacin Diarrhea  . Codeine Other (See Comments)    dizziness    . Epinephrine   . Flagyl [Metronidazole]   . Librax [Chlordiazepoxide-Clidinium]   . Phenobarbital   . Tetracyclines & Related Other (See Comments)    Throat swells   . Ultram [Tramadol] Other (See Comments)    Sick feeling     Current Outpatient Prescriptions on File Prior to Visit    Medication Sig Dispense Refill  . bismuth subsalicylate (PEPTO-BISMOL) 262 MG/15ML suspension Take 15 mLs by mouth every 6 (six) hours as needed.      . clonazePAM (KLONOPIN) 1 MG tablet Take 1 mg by mouth 2 (two) times daily.       Marland Kitchen escitalopram (LEXAPRO) 20 MG tablet Take 20 mg by mouth daily.        Marland Kitchen levothyroxine (SYNTHROID) 100 MCG tablet Take 1 tablet (100 mcg total) by mouth daily before breakfast.  90 tablet  3  . pantoprazole (PROTONIX) 40 MG tablet TAKE 1 TABLET BY MOUTH  DAILY.      . simvastatin (ZOCOR) 10 MG tablet TAKE 1 TABLET BY MOUTH AT BEDTIME  30 tablet  5   No current facility-administered medications on file prior to visit.     Past Medical History  Diagnosis Date  . Palpitations   . Other and unspecified hyperlipidemia   . Unspecified hereditary and idiopathic peripheral neuropathy   . Personal history of malignant neoplasm of large intestine     carcinoma - cecum, s/p right laparoscopic colectomy - s/p chemotherapy and XRT  . Anxiety   . Liver nodule     s/p negative biopsy  . Nephrolithiasis     s/p lithotripsy  . GERD (gastroesophageal reflux disease)   . Osteoporosis   . Malignant neoplasm of breast (female), unspecified site     s/p lumpectomy  . Malignant neoplasm of thyroid gland     s/p surgery and XRT     Past Surgical History  Procedure Laterality Date  . Appendectomy  1985  . Cholecystectomy  1995  . Thyroid lobectomy  2002    s/p XRT  . Laparoscopic partial colectomy      stage 3-C carcinoma of the cecum, s/p chemotherapy and xrt  . Lithotripsy    . Dilation and curettage of uterus  1990  . Breast lumpectomy  2009    breast cancer     Family History  Problem Relation Age of Onset  . Stroke Mother     6s  . Lung cancer Father   . Prostate cancer Father   . Breast cancer Sister   . Lung cancer Sister   . Breast cancer      aunt     History   Social History  . Marital Status: Divorced    Spouse Name: N/A    Number  of Children: 0  . Years of Education: N/A   Occupational History  . Not on file.   Social History Main Topics  . Smoking status: Never Smoker   . Smokeless tobacco: Never Used  . Alcohol Use: No  . Drug Use: No  . Sexual Activity: Not on file   Other Topics Concern  . Not on file   Social History Narrative  . No narrative on file     ROS A 10 point review of system was performed. It is negative other than that mentioned in the history of present illness.   PHYSICAL EXAM   BP 106/84  Pulse 83  Ht 5\' 4"  (1.626 m)  Wt 149 lb (67.586 kg)  BMI 25.56 kg/m2 Constitutional: She is oriented to person, place, and time. She appears well-developed and well-nourished. No distress.  HENT: No nasal discharge.  Head: Normocephalic and atraumatic.  Eyes: Pupils are equal and round. No discharge.  Neck: Normal range of motion. Neck supple. No JVD present. No thyromegaly present.  Cardiovascular: Tachycardic, regular rhythm, normal heart sounds. Exam reveals no gallop and no friction rub. No murmur heard.  Pulmonary/Chest: Effort normal and breath sounds normal. No stridor. No respiratory distress. She has no wheezes. She has no rales. She exhibits no tenderness.  Abdominal: Soft. Bowel sounds are normal. She exhibits no distension. There is no tenderness. There is no rebound and no guarding.  Musculoskeletal: Normal range of motion. She exhibits no edema and no tenderness.  Neurological: She is alert and oriented to person, place, and time. Coordination normal.  Skin: Skin is warm and dry. No rash noted. She is not diaphoretic. No erythema. No pallor.  Psychiatric: She has a normal mood and affect. Her behavior is normal. Judgment and thought content normal.     EKG: Sinus  Rhythm  Low voltage -possible pulmonary disease.   ABNORMAL     ASSESSMENT AND PLAN

## 2013-12-04 NOTE — Assessment & Plan Note (Signed)
The patient is having recurrent episodes of pericarditis. I resumed colchicine 0.6 mg once daily and will plan on using this for at least 3 months to decrease the chance of recurrent episodes. Continue naproxen for another few days. She is currently asymptomatic.

## 2013-12-04 NOTE — Patient Instructions (Signed)
Start Colchicine 0.6 mg once daily.   Follow up in 3 months.

## 2013-12-07 ENCOUNTER — Other Ambulatory Visit: Payer: Self-pay | Admitting: Internal Medicine

## 2013-12-20 ENCOUNTER — Encounter: Payer: Self-pay | Admitting: Internal Medicine

## 2014-01-23 ENCOUNTER — Encounter: Payer: Self-pay | Admitting: Internal Medicine

## 2014-01-23 ENCOUNTER — Other Ambulatory Visit (HOSPITAL_COMMUNITY)
Admission: RE | Admit: 2014-01-23 | Discharge: 2014-01-23 | Disposition: A | Discharge status: Home / Self Care | Payer: Medicare Other | Source: Ambulatory Visit | Attending: Internal Medicine | Admitting: Internal Medicine

## 2014-01-23 ENCOUNTER — Ambulatory Visit (INDEPENDENT_AMBULATORY_CARE_PROVIDER_SITE_OTHER): Payer: Medicare Other | Admitting: Internal Medicine

## 2014-01-23 VITALS — BP 120/80 | HR 80 | Temp 98.2°F | Ht 64.5 in | Wt 150.5 lb

## 2014-01-23 DIAGNOSIS — I309 Acute pericarditis, unspecified: Secondary | ICD-10-CM

## 2014-01-23 DIAGNOSIS — Z1151 Encounter for screening for human papillomavirus (HPV): Secondary | ICD-10-CM | POA: Diagnosis present

## 2014-01-23 DIAGNOSIS — F411 Generalized anxiety disorder: Secondary | ICD-10-CM

## 2014-01-23 DIAGNOSIS — F439 Reaction to severe stress, unspecified: Secondary | ICD-10-CM

## 2014-01-23 DIAGNOSIS — C73 Malignant neoplasm of thyroid gland: Secondary | ICD-10-CM

## 2014-01-23 DIAGNOSIS — Z124 Encounter for screening for malignant neoplasm of cervix: Secondary | ICD-10-CM | POA: Diagnosis present

## 2014-01-23 DIAGNOSIS — R945 Abnormal results of liver function studies: Secondary | ICD-10-CM

## 2014-01-23 DIAGNOSIS — Z85038 Personal history of other malignant neoplasm of large intestine: Secondary | ICD-10-CM

## 2014-01-23 DIAGNOSIS — R319 Hematuria, unspecified: Secondary | ICD-10-CM

## 2014-01-23 DIAGNOSIS — K219 Gastro-esophageal reflux disease without esophagitis: Secondary | ICD-10-CM

## 2014-01-23 DIAGNOSIS — Z733 Stress, not elsewhere classified: Secondary | ICD-10-CM

## 2014-01-23 DIAGNOSIS — R7989 Other specified abnormal findings of blood chemistry: Secondary | ICD-10-CM

## 2014-01-23 DIAGNOSIS — E785 Hyperlipidemia, unspecified: Secondary | ICD-10-CM

## 2014-01-23 DIAGNOSIS — F419 Anxiety disorder, unspecified: Secondary | ICD-10-CM

## 2014-01-23 DIAGNOSIS — C50919 Malignant neoplasm of unspecified site of unspecified female breast: Secondary | ICD-10-CM

## 2014-01-23 DIAGNOSIS — G609 Hereditary and idiopathic neuropathy, unspecified: Secondary | ICD-10-CM

## 2014-01-23 DIAGNOSIS — M810 Age-related osteoporosis without current pathological fracture: Secondary | ICD-10-CM

## 2014-01-23 DIAGNOSIS — Z9109 Other allergy status, other than to drugs and biological substances: Secondary | ICD-10-CM

## 2014-01-23 DIAGNOSIS — Z23 Encounter for immunization: Secondary | ICD-10-CM

## 2014-01-23 DIAGNOSIS — R002 Palpitations: Secondary | ICD-10-CM

## 2014-01-23 NOTE — Progress Notes (Signed)
Pre visit review using our clinic review tool, if applicable. No additional management support is needed unless otherwise documented below in the visit note. 

## 2014-01-25 ENCOUNTER — Telehealth: Payer: Self-pay | Admitting: Internal Medicine

## 2014-01-25 NOTE — Telephone Encounter (Signed)
Patient Information:  Caller Name: [place]  Phone: [phone]  Patient: Debra Hale  Gender: Female  DOB: [birth year]  Age: 63 Years  PCP: [name]  Office Follow Up:  Does the office need to follow up with this patient?: No  Instructions For The Office: N/A  RN Note:  Pt. having some common symptoms of the Influenza Vaccine. Reassured and information provided. Urged to buy a thermometer and have on hand. Pt. to call back if not doing better in 2 days.Pt. agrees.  Symptoms  Reason For Call & Symptoms: Pt. received the Flu vaccine on 01/23/14. Pt. had soreness on the Rt. side of her throat when she got the injection. Pt. c/o aching, shaking chills and feeling bad starting with awakening on 01/24/14. Pt. does not know if she has a fever.  Reviewed Health History In EMR: Yes  Reviewed Medications In EMR: Yes  Reviewed Allergies In EMR: Yes  Reviewed Surgeries / Procedures: Yes  Date of Onset of Symptoms: 01/24/2014  Guideline(s) Used:  Immunization Reactions  Disposition Per Guideline:   Home Care  Reason For Disposition Reached:   Immunization reactions, questions about  Advice Given:  Pain and Fever Medicines:  For pain or fever relief, take either acetaminophen or ibuprofen.  Call Back If:  You become worse.  Influenza (TIV; injection) Vaccine:  Local pain at injection site  Fever  Aches  If these symptoms occur, they usually last 1-2 days.  Patient Will Follow Care Advice:  YES

## 2014-01-28 ENCOUNTER — Encounter: Payer: Self-pay | Admitting: Internal Medicine

## 2014-01-28 DIAGNOSIS — F439 Reaction to severe stress, unspecified: Secondary | ICD-10-CM | POA: Insufficient documentation

## 2014-01-28 DIAGNOSIS — F430 Acute stress reaction: Secondary | ICD-10-CM | POA: Insufficient documentation

## 2014-01-28 NOTE — Assessment & Plan Note (Signed)
Planning for 6 month f/u mammogram as outlined.   Due next month.  Saw Dr Tamala Julian.

## 2014-01-28 NOTE — Assessment & Plan Note (Signed)
Just had colonoscopy 4/13.  GI symptoms improved off abx.  Follow.

## 2014-01-28 NOTE — Assessment & Plan Note (Signed)
Has been seeing Dr Clovis Riley.  Increased stress recently.  Currently stable.  Appears to be doing some better.  Follow.

## 2014-01-28 NOTE — Assessment & Plan Note (Signed)
On synthroid.  Had recent quantitative thyroglobulin and anti-thyroglobulin antibody.  Have kept her tsh suppressed previously.  She was having increased palpitations.  Adjusted synthroid.  TSH last checked and within the normal range, but would like to keep a little more suppressed.  Adjusted synthroid to 151mcg (1/2 tab) one day per week and then continue on 1107mcg q day all other days.  Follow tsh.

## 2014-01-28 NOTE — Assessment & Plan Note (Signed)
Does well on dymista.

## 2014-01-28 NOTE — Assessment & Plan Note (Signed)
Not a significant issue for her now.  Follow.   

## 2014-01-28 NOTE — Assessment & Plan Note (Signed)
12/22/12 abdominal ultrasound revealed fatty liver.  Follow liver panel.  Saw GI.  Refer to Dawson Bills note for details.

## 2014-01-28 NOTE — Assessment & Plan Note (Signed)
Noticed since her chemo treatment.  Stable.

## 2014-01-28 NOTE — Assessment & Plan Note (Signed)
Low cholesterol diet and exercise.  Follow lipid panel and liver function.  On simvastatin.

## 2014-01-28 NOTE — Assessment & Plan Note (Signed)
Continue f/u with Dr Octavia Heir.  Follow.  Better.

## 2014-01-28 NOTE — Assessment & Plan Note (Signed)
Symptoms appear to be controlled on current regimen.  Follow.

## 2014-01-28 NOTE — Assessment & Plan Note (Signed)
Was referred to Dr Yves Dill for further w/up.  Was treated recently for uti.  Needs f/u urinalysis to confirm no blood.    Will need referral back to urology if persistent hematuria.

## 2014-01-28 NOTE — Assessment & Plan Note (Signed)
Chest pain resolved.  Just saw Dr Fletcher Anon.  Back on colchicine.

## 2014-01-28 NOTE — Progress Notes (Signed)
Subjective:    Patient ID: Debra Hale, female    DOB: 07/01/1950, 63 y.o.   MRN: [id number]  HPI 63 year old female with past history of thyroid carcinoma s/p surgery and XRT, colon cancer s/p laparoscopic right colectomy followed by Dr Ma Hillock, nephrolithiasis, anxiety, osteoporosis and breast cancer who comes in today to follow up on these issues as well as for a complete physical exam.  Has been under increased stress recently.  We discussed this again today. Overall appears to be doing better.  Still seeing Dr Octavia Heir.   Breathing stable.  Dymista works well for her.  Allergy symptoms controlled.  Eating and drinking well.  No acid reflux.   Feels better.  No significant abdominal pain or cramping now.  Recently had issues with chest discomfort.  Was evaluated in the ER and by cardiology.  Diagnosed with probable pericarditis.  Better now.  Just saw Dr Fletcher Anon.  placed back on colchicine.  No chest discomfort now. Had an abnormal mammogram recently.  Recommended a 6 month f/u.  She was uncomfortable with this.  Requested to see Dr Tamala Julian for his opinion regarding a possible biopsy.  He recommended a 6 month f/u.   Due next month for f/u.      Past Medical History  Diagnosis Date  . Palpitations   . Other and unspecified hyperlipidemia   . Unspecified hereditary and idiopathic peripheral neuropathy   . Personal history of malignant neoplasm of large intestine     carcinoma - cecum, s/p right laparoscopic colectomy - s/p chemotherapy and XRT  . Anxiety   . Liver nodule     s/p negative biopsy  . Nephrolithiasis     s/p lithotripsy  . GERD (gastroesophageal reflux disease)   . Osteoporosis   . Malignant neoplasm of breast (female), unspecified site     s/p lumpectomy  . Malignant neoplasm of thyroid gland     s/p surgery and XRT    Current Outpatient Prescriptions on File Prior to Visit  Medication Sig Dispense Refill  . Azelastine-Fluticasone 137-50 MCG/ACT SUSP One spray each nostril  once a day as needed.      . bismuth subsalicylate (PEPTO-BISMOL) 262 MG/15ML suspension Take 15 mLs by mouth every 6 (six) hours as needed.      . clonazePAM (KLONOPIN) 1 MG tablet Take 1 mg by mouth 2 (two) times daily.       . colchicine (COLCRYS) 0.6 MG tablet Take 1 tablet (0.6 mg total) by mouth daily.  30 tablet  3  . escitalopram (LEXAPRO) 20 MG tablet Take 20 mg by mouth daily.        Marland Kitchen levothyroxine (SYNTHROID, LEVOTHROID) 100 MCG tablet TAKE 1 TABLET (100 MCG TOTAL) BY MOUTH DAILY.  90 tablet  3  . naproxen (NAPROSYN) 500 MG tablet Take 500 mg by mouth 2 (two) times daily with a meal.       . pantoprazole (PROTONIX) 40 MG tablet TAKE 1 TABLET BY MOUTH  DAILY.      . simvastatin (ZOCOR) 10 MG tablet TAKE 1 TABLET BY MOUTH AT BEDTIME  30 tablet  5   No current facility-administered medications on file prior to visit.    Review of Systems No problems with headaches reported today.   No lightheadedness or dizziness.  Allergy symptoms better on Dyamista.  No chest pain, tightness.  No palpitations.  No increased shortness of breath, cough or congestion.  No nausea or vomiting.  No acid reflux.  Previous abdominal pain and bloating as outlined.  Better now.   Bowels stable.   No BRBPR or melana.  Mammogram as outlined.  Recommend a 6 month f/u.  Due next month.  Back on colchicine.  Stress is better.  Still seeing Dr Octavia Heir.         Objective:   Physical Exam  Filed Vitals:   01/23/14 1505  BP: 120/80  Pulse: 80  Temp: 98.2 F (36.8 C)   Blood pressure recheck:  42/42  63 year old female in no acute distress.   HEENT:  Nares- clear.  Oropharynx - without lesions. NECK:  Supple.  Nontender.  No audible bruit.  HEART:  Appears to be regular. LUNGS:  No crackles or wheezing audible.  Respirations even and unlabored.  RADIAL PULSE:  Equal bilaterally.    BREASTS:  No nipple discharge or nipple retraction present.  Could not appreciate any distinct nodules or axillary adenopathy.   ABDOMEN:  Soft, nontender.  Bowel sounds present and normal.  No audible abdominal bruit.  GU:  Normal external genitalia.  Vaginal vault without lesions.  Cervix identified.  Pap performed. Could not appreciate any adnexal masses or tenderness.   EXTREMITIES:  No increased edema present.  DP pulses palpable and equal bilaterally.           Assessment & Plan:  INCREASED PSYCHOSOCIAL STRESSORS.  On Lexapro and Clonazepam.  Seeing Dr Clovis Riley.   Appears to be stable/better.    HEALTH MAINTENANCE.  Physical today.  Pap negative 05/30/12.   Requested repeat pap today.  Mammograms through Dr Candelaria Stagers office.  Mammogram as outlined.  Due for f/u next month.  Colonoscopy 05/18/11 revealed internal hemorrhoids.  Colonoscopy was repeated 4/13.  No evidence of colitis.    I spent 25 minutes with the patient and more than 50% of the time was spent in consultation regarding the above.

## 2014-01-29 LAB — CYTOLOGY - PAP

## 2014-01-30 ENCOUNTER — Encounter: Payer: Self-pay | Admitting: *Deleted

## 2014-01-30 ENCOUNTER — Other Ambulatory Visit (INDEPENDENT_AMBULATORY_CARE_PROVIDER_SITE_OTHER): Payer: Medicare Other

## 2014-01-30 DIAGNOSIS — R945 Abnormal results of liver function studies: Secondary | ICD-10-CM

## 2014-01-30 DIAGNOSIS — R7989 Other specified abnormal findings of blood chemistry: Secondary | ICD-10-CM

## 2014-01-30 DIAGNOSIS — R319 Hematuria, unspecified: Secondary | ICD-10-CM

## 2014-01-30 DIAGNOSIS — C73 Malignant neoplasm of thyroid gland: Secondary | ICD-10-CM

## 2014-01-30 DIAGNOSIS — Z85038 Personal history of other malignant neoplasm of large intestine: Secondary | ICD-10-CM

## 2014-01-30 DIAGNOSIS — M810 Age-related osteoporosis without current pathological fracture: Secondary | ICD-10-CM

## 2014-01-30 LAB — URINALYSIS, ROUTINE W REFLEX MICROSCOPIC
Bilirubin Urine: NEGATIVE
Ketones, ur: NEGATIVE
Nitrite: NEGATIVE
Specific Gravity, Urine: 1.02
Total Protein, Urine: NEGATIVE
Urine Glucose: NEGATIVE
Urobilinogen, UA: 0.2
pH: 6 (ref 5.0–8.0)

## 2014-01-30 LAB — HEPATIC FUNCTION PANEL
ALT: 66 U/L — ABNORMAL HIGH (ref 0–35)
AST: 26 U/L (ref 0–37)
Albumin: 3.9 g/dL (ref 3.5–5.2)
Alkaline Phosphatase: 93 U/L (ref 39–117)
Bilirubin, Direct: 0.1 mg/dL (ref 0.0–0.3)
Total Bilirubin: 0.7 mg/dL (ref 0.2–1.2)
Total Protein: 6.6 g/dL (ref 6.0–8.3)

## 2014-01-30 LAB — BASIC METABOLIC PANEL
BUN: 18 mg/dL (ref 6–23)
CHLORIDE: 106 meq/L (ref 96–112)
CO2: 27 mEq/L (ref 19–32)
Calcium: 9 mg/dL (ref 8.4–10.5)
Creatinine, Ser: 1 mg/dL (ref 0.4–1.2)
GFR: 61.41 mL/min (ref >60.00)
Glucose, Bld: 84 mg/dL (ref 70–99)
POTASSIUM: 3.4 meq/L — AB (ref 3.5–5.1)
Sodium: 142 mEq/L (ref 135–145)

## 2014-01-30 LAB — LIPID PANEL
CHOL/HDL RATIO: 4
Cholesterol: 156 mg/dL (ref 0–200)
HDL: 42.3 mg/dL (ref >39.00)
LDL Cholesterol: 81 mg/dL (ref 0–99)
NonHDL: 113.7
TRIGLYCERIDES: 162 mg/dL — AB (ref 0.0–149.0)
VLDL: 32.4 mg/dL (ref 0.0–40.0)

## 2014-01-30 LAB — VITAMIN D 25 HYDROXY (VIT D DEFICIENCY, FRACTURES): VITD: 18.81 ng/mL — ABNORMAL LOW (ref 30.00–100.00)

## 2014-01-30 LAB — TSH: TSH: 0.41 u[IU]/mL (ref 0.35–4.50)

## 2014-01-31 ENCOUNTER — Other Ambulatory Visit: Payer: Self-pay | Admitting: Internal Medicine

## 2014-01-31 ENCOUNTER — Encounter: Payer: Self-pay | Admitting: *Deleted

## 2014-01-31 DIAGNOSIS — R945 Abnormal results of liver function studies: Secondary | ICD-10-CM

## 2014-01-31 DIAGNOSIS — E876 Hypokalemia: Secondary | ICD-10-CM

## 2014-01-31 DIAGNOSIS — R7989 Other specified abnormal findings of blood chemistry: Secondary | ICD-10-CM

## 2014-01-31 NOTE — Progress Notes (Signed)
Order placed for f/u labs.  

## 2014-02-02 ENCOUNTER — Telehealth: Payer: Self-pay | Admitting: Internal Medicine

## 2014-02-02 NOTE — Telephone Encounter (Signed)
Returned call, no answer and no VM

## 2014-02-02 NOTE — Telephone Encounter (Signed)
Patient wanting lab results. 

## 2014-02-08 ENCOUNTER — Ambulatory Visit: Payer: Self-pay | Admitting: Internal Medicine

## 2014-02-08 LAB — HM MAMMOGRAPHY

## 2014-02-16 ENCOUNTER — Other Ambulatory Visit (INDEPENDENT_AMBULATORY_CARE_PROVIDER_SITE_OTHER): Payer: Medicare Other

## 2014-02-16 DIAGNOSIS — R945 Abnormal results of liver function studies: Secondary | ICD-10-CM

## 2014-02-16 DIAGNOSIS — R7989 Other specified abnormal findings of blood chemistry: Secondary | ICD-10-CM

## 2014-02-16 DIAGNOSIS — E876 Hypokalemia: Secondary | ICD-10-CM

## 2014-02-16 LAB — HEPATIC FUNCTION PANEL
ALT: 67 U/L — ABNORMAL HIGH (ref 0–35)
AST: 30 U/L (ref 0–37)
Albumin: 3.6 g/dL (ref 3.5–5.2)
Alkaline Phosphatase: 114 U/L (ref 39–117)
BILIRUBIN DIRECT: 0.1 mg/dL (ref 0.0–0.3)
BILIRUBIN TOTAL: 0.8 mg/dL (ref 0.2–1.2)
Total Protein: 7 g/dL (ref 6.0–8.3)

## 2014-02-16 LAB — POTASSIUM: Potassium: 3.4 mEq/L — ABNORMAL LOW (ref 3.5–5.1)

## 2014-02-18 ENCOUNTER — Other Ambulatory Visit: Payer: Self-pay | Admitting: Internal Medicine

## 2014-02-18 DIAGNOSIS — E876 Hypokalemia: Secondary | ICD-10-CM

## 2014-02-18 NOTE — Progress Notes (Signed)
Order placed for f/u potassium check.  

## 2014-02-19 ENCOUNTER — Encounter: Payer: Self-pay | Admitting: *Deleted

## 2014-03-12 ENCOUNTER — Other Ambulatory Visit (INDEPENDENT_AMBULATORY_CARE_PROVIDER_SITE_OTHER): Payer: Medicare Other

## 2014-03-12 DIAGNOSIS — E876 Hypokalemia: Secondary | ICD-10-CM

## 2014-03-12 LAB — POTASSIUM: POTASSIUM: 3.5 meq/L (ref 3.5–5.1)

## 2014-03-13 ENCOUNTER — Encounter: Payer: Self-pay | Admitting: Cardiovascular Disease

## 2014-03-13 ENCOUNTER — Encounter: Payer: Self-pay | Admitting: *Deleted

## 2014-03-13 ENCOUNTER — Ambulatory Visit (INDEPENDENT_AMBULATORY_CARE_PROVIDER_SITE_OTHER): Payer: Medicare Other | Admitting: Cardiovascular Disease

## 2014-03-13 VITALS — BP 120/90 | HR 82 | Ht 64.0 in | Wt 151.5 lb

## 2014-03-13 DIAGNOSIS — I309 Acute pericarditis, unspecified: Secondary | ICD-10-CM

## 2014-03-13 NOTE — Progress Notes (Signed)
Primary care physician: Dr. 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  She has known history of hyperlipidemia. There is no history of hypertension, diabetes, tobacco use and no family history of coronary artery disease. Other medical conditions include thyroid carcinoma s/p surgery and XRT, colon cancer s/p laparoscopic right colectomy followed by Dr Ma Hillock, nephrolithiasis, anxiety, osteoporosis and breast cancer. She was seen in January for intermittent pleuritic chest pain with associated palpitations. She had symptoms suggestive of sinusitis as well . D-dimer was mildly elevated. CTA of the chest showed no evidence of pulmonary embolism. Coronary calcifications were noted. An echocardiogram showed normal LV systolic function with trace pericardial effusion. Mild case of pericarditis was suspected. She was taking Advil with subsequent improvement in her symptoms.  However, she had worsening chest pain on February 28 and went to the emergency room at [facility]. Sedimentation rate was elevated at 75. CTA of the chest showed no and embolism. Small pericardial effusion was noted. She was treated with NSAIDs and colchicine for one month. She had another episode. I placed her on colchicine. However, she could not afford the medication. She tells me that the pharmacist gave her a different medication but she does not know the name of it. She took that for one month. She reports no recurrent episodes of chest pain.    Allergies  Allergen Reactions  . Bentyl [Dicyclomine Hcl]   . Ciprofloxacin Diarrhea  . Codeine Other (See Comments)    dizziness    . Epinephrine   . Flagyl [Metronidazole]   . Librax [Chlordiazepoxide-Clidinium]   . Phenobarbital   . Tetracyclines & Related Other (See Comments)    Throat swells   . Ultram [Tramadol] Other (See Comments)    Sick feeling       Current Outpatient Prescriptions on File Prior to Visit  Medication Sig Dispense Refill  . Azelastine-Fluticasone 137-50 MCG/ACT SUSP One spray each nostril once a day as needed.    . bismuth subsalicylate (PEPTO-BISMOL) 262 MG/15ML suspension Take 15 mLs by mouth every 6 (six) hours as needed.    . clonazePAM (KLONOPIN) 1 MG tablet Take 1 mg by mouth 2 (two) times daily.     . colchicine (COLCRYS) 0.6 MG tablet Take 1 tablet (0.6 mg total) by mouth daily. 30 tablet 3  . escitalopram (LEXAPRO) 20 MG tablet Take 20 mg by mouth daily.      Marland Kitchen levothyroxine (SYNTHROID, LEVOTHROID) 100 MCG tablet TAKE 1 TABLET (100 MCG TOTAL) BY MOUTH DAILY. 90 tablet 3  . naproxen (NAPROSYN) 500 MG tablet Take 500 mg by mouth as needed.     . pantoprazole (PROTONIX) 40 MG tablet TAKE 1 TABLET BY MOUTH  DAILY.    . simvastatin (ZOCOR) 10 MG tablet TAKE 1 TABLET BY MOUTH AT BEDTIME 30 tablet 5   No current facility-administered medications on file prior to visit.     Past Medical History  Diagnosis Date  . Palpitations   . Other and unspecified hyperlipidemia   . Unspecified hereditary and idiopathic peripheral neuropathy   . Personal history of malignant neoplasm of large intestine     carcinoma - cecum, s/p right laparoscopic colectomy - s/p chemotherapy and XRT  . Anxiety   . Liver nodule     s/p negative biopsy  . Nephrolithiasis     s/p lithotripsy  . GERD (gastroesophageal reflux disease)   .  Osteoporosis   . Malignant neoplasm of breast (female), unspecified site     s/p lumpectomy  . Malignant neoplasm of thyroid gland     s/p surgery and XRT     Past Surgical History  Procedure Laterality Date  . Appendectomy  1985  . Cholecystectomy  1995  . Thyroid lobectomy  2002    s/p XRT  . Laparoscopic partial colectomy      stage 3-C carcinoma of the cecum, s/p chemotherapy and xrt  . Lithotripsy    . Dilation and curettage of uterus  1990  . Breast lumpectomy  2009    breast cancer      Family History  Problem Relation Age of Onset  . Stroke Mother     47s  . Lung cancer Father   . Prostate cancer Father   . Breast cancer Sister   . Lung cancer Sister   . Breast cancer      aunt     History   Social History  . Marital Status: Divorced    Spouse Name: N/A    Number of Children: 0  . Years of Education: N/A   Occupational History  . Not on file.   Social History Main Topics  . Smoking status: Never Smoker   . Smokeless tobacco: Never Used  . Alcohol Use: No  . Drug Use: No  . Sexual Activity: Not on file   Other Topics Concern  . Not on file   Social History Narrative     ROS A 10 point review of system was performed. It is negative other than that mentioned in the history of present illness.   PHYSICAL EXAM   BP 120/90 mmHg  Pulse 82  Ht 5\' 4"  (1.626 m)  Wt 151 lb 8 oz (68.72 kg)  BMI 25.99 kg/m2 Constitutional: She is oriented to person, place, and time. She appears well-developed and well-nourished. No distress.  HENT: No nasal discharge.  Head: Normocephalic and atraumatic.  Eyes: Pupils are equal and round. No discharge.  Neck: Normal range of motion. Neck supple. No JVD present. No thyromegaly present.  Cardiovascular: Tachycardic, regular rhythm, normal heart sounds. Exam reveals no gallop and no friction rub. No murmur heard.  Pulmonary/Chest: Effort normal and breath sounds normal. No stridor. No respiratory distress. She has no wheezes. She has no rales. She exhibits no tenderness.  Abdominal: Soft. Bowel sounds are normal. She exhibits no distension. There is no tenderness. There is no rebound and no guarding.  Musculoskeletal: Normal range of motion. She exhibits no edema and no tenderness.  Neurological: She is alert and oriented to person, place, and time. Coordination normal.  Skin: Skin is warm and dry. No rash noted. She is not diaphoretic. No erythema. No pallor.  Psychiatric: She has a normal mood and affect. Her  behavior is normal. Judgment and thought content normal.        ASSESSMENT AND PLAN

## 2014-03-13 NOTE — Assessment & Plan Note (Signed)
The patient has history of recurrent pericarditis with no recent episodes. Unfortunately, she could not afford colchicine. If she develops any recurrent episodes, I recommend a 3-6 months course of colchicine and repeating echocardiogram. In the meanwhile, she is completely asymptomatic and can follow-up with me as needed.

## 2014-03-13 NOTE — Patient Instructions (Signed)
Follow up as needed.   Your next appointment will be scheduled in our new office located at :  [facility]  [address] [place]  [place], [place] [address]

## 2014-05-25 ENCOUNTER — Ambulatory Visit: Payer: Medicare Other | Admitting: Internal Medicine

## 2014-05-31 ENCOUNTER — Other Ambulatory Visit: Payer: Self-pay | Admitting: Internal Medicine

## 2014-06-14 ENCOUNTER — Encounter: Payer: Self-pay | Admitting: Internal Medicine

## 2014-06-14 ENCOUNTER — Ambulatory Visit (INDEPENDENT_AMBULATORY_CARE_PROVIDER_SITE_OTHER): Payer: PPO | Admitting: Internal Medicine

## 2014-06-14 VITALS — BP 116/76 | HR 84 | Temp 98.2°F | Ht 64.0 in | Wt 150.4 lb

## 2014-06-14 DIAGNOSIS — C73 Malignant neoplasm of thyroid gland: Secondary | ICD-10-CM

## 2014-06-14 DIAGNOSIS — I309 Acute pericarditis, unspecified: Secondary | ICD-10-CM

## 2014-06-14 DIAGNOSIS — Z85038 Personal history of other malignant neoplasm of large intestine: Secondary | ICD-10-CM

## 2014-06-14 DIAGNOSIS — R143 Flatulence: Secondary | ICD-10-CM

## 2014-06-14 DIAGNOSIS — K219 Gastro-esophageal reflux disease without esophagitis: Secondary | ICD-10-CM

## 2014-06-14 DIAGNOSIS — R945 Abnormal results of liver function studies: Secondary | ICD-10-CM

## 2014-06-14 DIAGNOSIS — E7800 Pure hypercholesterolemia, unspecified: Secondary | ICD-10-CM

## 2014-06-14 DIAGNOSIS — Z0000 Encounter for general adult medical examination without abnormal findings: Secondary | ICD-10-CM

## 2014-06-14 DIAGNOSIS — C50919 Malignant neoplasm of unspecified site of unspecified female breast: Secondary | ICD-10-CM

## 2014-06-14 DIAGNOSIS — F439 Reaction to severe stress, unspecified: Secondary | ICD-10-CM

## 2014-06-14 DIAGNOSIS — Z9109 Other allergy status, other than to drugs and biological substances: Secondary | ICD-10-CM

## 2014-06-14 DIAGNOSIS — R319 Hematuria, unspecified: Secondary | ICD-10-CM

## 2014-06-14 DIAGNOSIS — IMO0001 Reserved for inherently not codable concepts without codable children: Secondary | ICD-10-CM

## 2014-06-14 DIAGNOSIS — F419 Anxiety disorder, unspecified: Secondary | ICD-10-CM

## 2014-06-14 DIAGNOSIS — Z91048 Other nonmedicinal substance allergy status: Secondary | ICD-10-CM

## 2014-06-14 DIAGNOSIS — R7989 Other specified abnormal findings of blood chemistry: Secondary | ICD-10-CM

## 2014-06-14 DIAGNOSIS — Z658 Other specified problems related to psychosocial circumstances: Secondary | ICD-10-CM

## 2014-06-14 MED ORDER — PANTOPRAZOLE SODIUM 40 MG PO TBEC: DELAYED_RELEASE_TABLET | ORAL | Status: DC

## 2014-06-14 MED ORDER — AZELASTINE-FLUTICASONE 137-50 MCG/ACT NA SUSP: NASAL | Status: DC

## 2014-06-14 NOTE — Progress Notes (Signed)
Pre visit review using our clinic review tool, if applicable. No additional management support is needed unless otherwise documented below in the visit note. 

## 2014-06-14 NOTE — Progress Notes (Signed)
Patient ID: Debra Hale, female   DOB: [birth year], 64 y.o.   MRN: [id number]   Subjective:    Patient ID: Debra Hale, female    DOB: 02-26-1951, 64 y.o.   MRN: [id number]  HPI  Patient here for a scheduled follow up.  She has been doing relatively well.  Some increased gas.  Does relate this is worse with certain foods.  Intermittent abdominal pain.  No acid reflux.  Some minimal diarrhea.  Had her last colonoscopy in 08/2011.  States due three years for f/u colonoscopy.  Some increased stress.  Seeing Dr Bridgett Larsson.  Adjusting her medication.  Due f/u 06/18/14.  Sees Dr Ma Hillock.  Due f/u in 08/2014.  Sees Dr Tamala Julian for her breast.  States had recent f/u mammogram.  States everything checked out ok.  Trying to stay active.  Still having some sinus/nasal issues.     Past Medical History  Diagnosis Date  . Palpitations   . Other and unspecified hyperlipidemia   . Unspecified hereditary and idiopathic peripheral neuropathy   . Personal history of malignant neoplasm of large intestine     carcinoma - cecum, s/p right laparoscopic colectomy - s/p chemotherapy and XRT  . Anxiety   . Liver nodule     s/p negative biopsy  . Nephrolithiasis     s/p lithotripsy  . GERD (gastroesophageal reflux disease)   . Osteoporosis   . Malignant neoplasm of breast (female), unspecified site     s/p lumpectomy  . Malignant neoplasm of thyroid gland     s/p surgery and XRT    Current Outpatient Prescriptions on File Prior to Visit  Medication Sig Dispense Refill  . bismuth subsalicylate (PEPTO-BISMOL) 262 MG/15ML suspension Take 15 mLs by mouth every 6 (six) hours as needed.    . clonazePAM (KLONOPIN) 1 MG tablet Take 1 mg by mouth 2 (two) times daily.     Marland Kitchen escitalopram (LEXAPRO) 20 MG tablet Take 20 mg by mouth daily.      Marland Kitchen levothyroxine (SYNTHROID, LEVOTHROID) 100 MCG tablet TAKE 1 TABLET (100 MCG TOTAL) BY MOUTH DAILY. 90 tablet 3  . simvastatin (ZOCOR) 10 MG tablet TAKE 1 TABLET BY MOUTH AT BEDTIME 30  tablet 5   No current facility-administered medications on file prior to visit.    Review of Systems  Constitutional: Negative for unexpected weight change.  HENT: Positive for congestion and sinus pressure (some intermittent sinus pressure.  ). Negative for postnasal drip.   Respiratory: Negative for cough, chest tightness and shortness of breath.   Cardiovascular: Negative for chest pain, palpitations and leg swelling.  Gastrointestinal: Positive for abdominal pain (intermitttent abdominal pain) and diarrhea (occasional minimal diarrhea. ). Negative for nausea, vomiting and constipation.       Increased gas.   Musculoskeletal: Negative for back pain and joint swelling.  Skin: Negative for color change and rash.  Neurological: Negative for dizziness, light-headedness and headaches.  Psychiatric/Behavioral:       Increased stress.  Due to see Dr Bridgett Larsson next week.  Taking clonazepam at night.         Objective:    Physical Exam  HENT:  Nose: Nose normal.  Mouth/Throat: Oropharynx is clear and moist.  Neck: Neck supple. No thyromegaly present.  Cardiovascular: Normal rate and regular rhythm.   Pulmonary/Chest: Breath sounds normal. No respiratory distress. She has no wheezes.  Abdominal: Soft. Bowel sounds are normal. There is no tenderness.  Musculoskeletal: She exhibits no edema or tenderness.  Lymphadenopathy:    She has no cervical adenopathy.  Skin: No rash noted. No erythema.  Psychiatric:  Behavior normal.       BP 116/76 mmHg  Pulse 84  Temp(Src) 98.2 F (36.8 C) (Oral)  Ht 5\' 4"  (1.626 m)  Wt 150 lb 6 oz (68.21 kg)  BMI 25.80 kg/m2  SpO2 95% Wt Readings from Last 3 Encounters:  06/14/14 150 lb 6 oz (68.21 kg)  03/13/14 151 lb 8 oz (68.72 kg)  01/23/14 150 lb 8 oz (68.266 kg)     Lab Results  Component Value Date   WBC 4.5 08/30/2013   HGB 14.0 08/30/2013   HCT 41.1 08/30/2013   PLT 203.0 08/30/2013   GLUCOSE 84 01/30/2014   CHOL 156 01/30/2014   TRIG  162.0* 01/30/2014   HDL 42.30 01/30/2014   LDLCALC 81 01/30/2014   ALT 67* 02/16/2014   AST 30 02/16/2014   NA 142 01/30/2014   K 3.5 03/12/2014   CL 106 01/30/2014   CREATININE 1.0 01/30/2014   BUN 18 01/30/2014   CO2 27 01/30/2014   TSH 0.41 01/30/2014   HGBA1C 5.2 08/30/2013       Assessment & Plan:   Problem List Items Addressed This Visit    Abnormal liver function test    Previous abdominal US revealed fatty liver.  Recheck liver panel.        Relevant Orders   Hepatic function panel   Acute pericarditis - Primary    Sees Dr Fletcher Anon.  Stable.  No reoccurring episodes.        Anxiety    Has been seeing Dr Clovis Riley.  Plans to f/u next week.        CARCINOMA, THYROID GLAND, PAPILLARY    On synthroid.  Follow quantitative thyroglobulin and antithyroglobulin antibody.  Have previously kept her tsh suppressed.  Was having increased palpitations.  Adjusted her synthroid.  Recheck tsh.        Relevant Orders   Anti-thyroglobulin antibody   Thyroglobulin,Quant Rebaseline   TSH   T3, free   T4, free   Environmental allergies    Dymista works well for her.  Follow.        Gas    Increased gas and bowel change as outlined.  Refer back to GI for evaluation.  Start a probiotic daily.        Relevant Orders   Ambulatory referral to Gastroenterology   GERD (gastroesophageal reflux disease)    Reflux controlled.  On protonix.        Relevant Medications   pantoprazole (PROTONIX) EC tablet   Health care maintenance    Physical 01/23/14.  PAP 05/30/12 - negative.  Mammogram through Dr Thompson Caul office.  Colonoscopy 08/2011.        Hematuria    Was referred to Dr Yves Dill for further w/up.  Last urinalysis 3-6 rbc's.  May need referral back to urology.  Follow.        Relevant Orders   Urinalysis, Routine w reflex microscopic   History of malignant neoplasm of large intestine    Last colonoscopy 08/2011.  With the increased gas and bowel change, refer back to GI for  evaluation.  States due f/u colonoscopy this year.  Add probiotic daily.  Follow.        Relevant Orders   Ambulatory referral to Gastroenterology   Hypercholesterolemia    Low cholesterol diet and exercise.  On simvastatin.  Follow lipid panel and liver function tests.  Relevant Orders   Lipid panel   Basic metabolic panel   Malignant neoplasm of female breast    Saw Dr Tamala Julian 09/11/13.  Recommended f/u diagnostic mammogram (right breast) in 6 months.  States had performed and was ok.  Obtain results.        Stress    Sees Dr Clovis Riley.  Due to f/u next week.          I spent 25 minutes with the patient and more than 50% of the time was spent in consultation regarding the above.     [name], MD

## 2014-06-17 ENCOUNTER — Telehealth: Payer: Self-pay | Admitting: Internal Medicine

## 2014-06-17 ENCOUNTER — Encounter: Payer: Self-pay | Admitting: Internal Medicine

## 2014-06-17 DIAGNOSIS — IMO0001 Reserved for inherently not codable concepts without codable children: Secondary | ICD-10-CM | POA: Insufficient documentation

## 2014-06-17 DIAGNOSIS — R141 Gas pain: Secondary | ICD-10-CM | POA: Insufficient documentation

## 2014-06-17 DIAGNOSIS — Z0000 Encounter for general adult medical examination without abnormal findings: Secondary | ICD-10-CM | POA: Insufficient documentation

## 2014-06-17 NOTE — Assessment & Plan Note (Signed)
Dymista works well for her.  Follow.

## 2014-06-17 NOTE — Assessment & Plan Note (Signed)
Has been seeing Dr Clovis Riley.  Plans to f/u next week.

## 2014-06-17 NOTE — Assessment & Plan Note (Signed)
Sees Dr Fletcher Anon.  Stable.  No reoccurring episodes.

## 2014-06-17 NOTE — Assessment & Plan Note (Signed)
Last colonoscopy 08/2011.  With the increased gas and bowel change, refer back to GI for evaluation.  States due f/u colonoscopy this year.  Add probiotic daily.  Follow.

## 2014-06-17 NOTE — Assessment & Plan Note (Signed)
Previous abdominal US revealed fatty liver.  Recheck liver panel.

## 2014-06-17 NOTE — Assessment & Plan Note (Signed)
Reflux controlled.  On protonix.

## 2014-06-17 NOTE — Assessment & Plan Note (Signed)
Sees Dr Clovis Riley.  Due to f/u next week.

## 2014-06-17 NOTE — Telephone Encounter (Signed)
Need mammogram results from Dr Thompson Caul [facility]) office.  Had in May 2015 and 03/2014.  Need results for our record.  Hold until we receive.

## 2014-06-17 NOTE — Assessment & Plan Note (Signed)
Was referred to Dr Yves Dill for further w/up.  Last urinalysis 3-6 rbc's.  May need referral back to urology.  Follow.

## 2014-06-17 NOTE — Assessment & Plan Note (Signed)
Saw Dr Tamala Julian 09/11/13.  Recommended f/u diagnostic mammogram (right breast) in 6 months.  States had performed and was ok.  Obtain results.

## 2014-06-17 NOTE — Assessment & Plan Note (Signed)
Low cholesterol diet and exercise.  On simvastatin.  Follow lipid panel and liver function tests.   

## 2014-06-17 NOTE — Assessment & Plan Note (Signed)
On synthroid.  Follow quantitative thyroglobulin and antithyroglobulin antibody.  Have previously kept her tsh suppressed.  Was having increased palpitations.  Adjusted her synthroid.  Recheck tsh.

## 2014-06-17 NOTE — Assessment & Plan Note (Signed)
Physical 01/23/14.  PAP 05/30/12 - negative.  Mammogram through Dr Thompson Caul office.  Colonoscopy 08/2011.

## 2014-06-17 NOTE — Assessment & Plan Note (Signed)
Increased gas and bowel change as outlined.  Refer back to GI for evaluation.  Start a probiotic daily.

## 2014-06-19 ENCOUNTER — Encounter: Payer: Self-pay | Admitting: Internal Medicine

## 2014-06-19 NOTE — Telephone Encounter (Signed)
Records requested

## 2014-06-19 NOTE — Telephone Encounter (Signed)
Records received, abstracted, & placed in folder for review & signature. Note: results from 4/15 was already scanned in chart.

## 2014-06-20 NOTE — Telephone Encounter (Signed)
Noted  

## 2014-08-02 ENCOUNTER — Ambulatory Visit
Admit: 2014-08-02 | Disposition: A | Discharge status: Home / Self Care | Payer: Self-pay | Attending: Unknown Physician Specialty | Admitting: Unknown Physician Specialty

## 2014-08-02 LAB — HM COLONOSCOPY

## 2014-08-09 ENCOUNTER — Encounter: Payer: Self-pay | Admitting: Internal Medicine

## 2014-08-27 LAB — SURGICAL PATHOLOGY

## 2014-09-11 ENCOUNTER — Other Ambulatory Visit (INDEPENDENT_AMBULATORY_CARE_PROVIDER_SITE_OTHER): Payer: PPO

## 2014-09-11 DIAGNOSIS — R319 Hematuria, unspecified: Secondary | ICD-10-CM

## 2014-09-11 DIAGNOSIS — C73 Malignant neoplasm of thyroid gland: Secondary | ICD-10-CM | POA: Diagnosis not present

## 2014-09-11 DIAGNOSIS — E7800 Pure hypercholesterolemia, unspecified: Secondary | ICD-10-CM

## 2014-09-11 DIAGNOSIS — R7989 Other specified abnormal findings of blood chemistry: Secondary | ICD-10-CM

## 2014-09-11 DIAGNOSIS — R945 Abnormal results of liver function studies: Secondary | ICD-10-CM

## 2014-09-11 LAB — BASIC METABOLIC PANEL
BUN: 19 mg/dL (ref 6–23)
CO2: 30 meq/L (ref 19–32)
Calcium: 9.2 mg/dL (ref 8.4–10.5)
Chloride: 106 mEq/L (ref 96–112)
Creatinine, Ser: 0.93 mg/dL (ref 0.40–1.20)
GFR: 64.34 mL/min (ref >60.00)
GLUCOSE: 95 mg/dL (ref 70–99)
Potassium: 4 mEq/L (ref 3.5–5.1)
SODIUM: 143 meq/L (ref 135–145)

## 2014-09-11 LAB — HEPATIC FUNCTION PANEL
ALK PHOS: 99 U/L (ref 39–117)
ALT: 68 U/L — AB (ref 0–35)
AST: 25 U/L (ref 0–37)
Albumin: 3.8 g/dL (ref 3.5–5.2)
BILIRUBIN TOTAL: 0.6 mg/dL (ref 0.2–1.2)
Bilirubin, Direct: 0.1 mg/dL (ref 0.0–0.3)
Total Protein: 6.3 g/dL (ref 6.0–8.3)

## 2014-09-11 LAB — URINALYSIS, ROUTINE W REFLEX MICROSCOPIC
Bilirubin Urine: NEGATIVE
KETONES UR: NEGATIVE
Leukocytes, UA: NEGATIVE
Nitrite: NEGATIVE
TOTAL PROTEIN, URINE-UPE24: NEGATIVE
URINE GLUCOSE: NEGATIVE
UROBILINOGEN UA: 0.2 (ref 0.0–1.0)
WBC, UA: NONE SEEN (ref 0–?)
pH: 5.5 (ref 5.0–8.0)

## 2014-09-11 LAB — TSH: TSH: 0.15 u[IU]/mL — ABNORMAL LOW (ref 0.35–4.50)

## 2014-09-11 LAB — LIPID PANEL
CHOL/HDL RATIO: 4
Cholesterol: 159 mg/dL (ref 0–200)
HDL: 44.4 mg/dL (ref >39.00)
LDL Cholesterol: 79 mg/dL (ref 0–99)
NONHDL: 114.6
Triglycerides: 176 mg/dL — ABNORMAL HIGH (ref 0.0–149.0)
VLDL: 35.2 mg/dL (ref 0.0–40.0)

## 2014-09-11 LAB — T3, FREE: T3, Free: 3.4 pg/mL (ref 2.3–4.2)

## 2014-09-11 LAB — T4, FREE: Free T4: 0.93 ng/dL (ref 0.60–1.60)

## 2014-09-12 ENCOUNTER — Telehealth: Payer: Self-pay | Admitting: *Deleted

## 2014-09-12 ENCOUNTER — Other Ambulatory Visit: Payer: Self-pay | Admitting: *Deleted

## 2014-09-12 DIAGNOSIS — C73 Malignant neoplasm of thyroid gland: Secondary | ICD-10-CM

## 2014-09-12 NOTE — Telephone Encounter (Signed)
Order placed for lab 

## 2014-09-12 NOTE — Telephone Encounter (Signed)
[facility] called saying they no longer do the Thyroglobulin, quant rebaseline and that if you would like to test for something else?

## 2014-09-13 ENCOUNTER — Ambulatory Visit (INDEPENDENT_AMBULATORY_CARE_PROVIDER_SITE_OTHER): Payer: PPO | Admitting: Internal Medicine

## 2014-09-13 ENCOUNTER — Encounter: Payer: Self-pay | Admitting: Internal Medicine

## 2014-09-13 VITALS — BP 110/69 | HR 83 | Temp 98.2°F | Ht 64.5 in | Wt 151.0 lb

## 2014-09-13 DIAGNOSIS — K219 Gastro-esophageal reflux disease without esophagitis: Secondary | ICD-10-CM | POA: Diagnosis not present

## 2014-09-13 DIAGNOSIS — F439 Reaction to severe stress, unspecified: Secondary | ICD-10-CM

## 2014-09-13 DIAGNOSIS — Z658 Other specified problems related to psychosocial circumstances: Secondary | ICD-10-CM

## 2014-09-13 DIAGNOSIS — R945 Abnormal results of liver function studies: Secondary | ICD-10-CM

## 2014-09-13 DIAGNOSIS — Z0000 Encounter for general adult medical examination without abnormal findings: Secondary | ICD-10-CM

## 2014-09-13 DIAGNOSIS — C73 Malignant neoplasm of thyroid gland: Secondary | ICD-10-CM

## 2014-09-13 DIAGNOSIS — G629 Polyneuropathy, unspecified: Secondary | ICD-10-CM | POA: Diagnosis not present

## 2014-09-13 DIAGNOSIS — R7989 Other specified abnormal findings of blood chemistry: Secondary | ICD-10-CM

## 2014-09-13 DIAGNOSIS — F419 Anxiety disorder, unspecified: Secondary | ICD-10-CM

## 2014-09-13 DIAGNOSIS — C50919 Malignant neoplasm of unspecified site of unspecified female breast: Secondary | ICD-10-CM

## 2014-09-13 DIAGNOSIS — R319 Hematuria, unspecified: Secondary | ICD-10-CM

## 2014-09-13 DIAGNOSIS — R002 Palpitations: Secondary | ICD-10-CM

## 2014-09-13 DIAGNOSIS — Z9109 Other allergy status, other than to drugs and biological substances: Secondary | ICD-10-CM

## 2014-09-13 DIAGNOSIS — E7800 Pure hypercholesterolemia, unspecified: Secondary | ICD-10-CM

## 2014-09-13 DIAGNOSIS — Z85038 Personal history of other malignant neoplasm of large intestine: Secondary | ICD-10-CM

## 2014-09-13 DIAGNOSIS — Z91048 Other nonmedicinal substance allergy status: Secondary | ICD-10-CM

## 2014-09-13 LAB — THYROGLOBULIN LEVEL: Thyroglobulin: <0.1 ng/mL — ABNORMAL LOW (ref 2.8–40.9)

## 2014-09-13 MED ORDER — LEVOTHYROXINE SODIUM 88 MCG PO TABS: 88.0000 ug | ORAL_TABLET | Freq: Every day | ORAL | Status: DC

## 2014-09-13 NOTE — Progress Notes (Signed)
Patient ID: Debra Hale, female   DOB: 05-06-1950, 64 y.o.   MRN: [id number]   Subjective:    Patient ID: Debra Hale, female    DOB: 02-20-1951, 65 y.o.   MRN: [id number]  HPI  Patient here to follow up on her current medical issues and for her physical.  Just had f/u colonoscopy 08/02/14.  Sees GI regularly.  Increased stress.  Still seeing Dr Clovis Riley.  On lexapro and clonazepam.  No suicidal ideations.  Still with some allergy symptoms.  Dymista works for her.  No increased sob.  Eating and drinking well.  No bowel change.     Past Medical History  Diagnosis Date  . Palpitations   . Other and unspecified hyperlipidemia   . Unspecified hereditary and idiopathic peripheral neuropathy   . Personal history of malignant neoplasm of large intestine     carcinoma - cecum, s/p right laparoscopic colectomy - s/p chemotherapy and XRT  . Anxiety   . Liver nodule     s/p negative biopsy  . Nephrolithiasis     s/p lithotripsy  . GERD (gastroesophageal reflux disease)   . Osteoporosis   . Malignant neoplasm of breast (female), unspecified site     s/p lumpectomy  . Malignant neoplasm of thyroid gland     s/p surgery and XRT    Current Outpatient Prescriptions on File Prior to Visit  Medication Sig Dispense Refill  . Azelastine-Fluticasone 137-50 MCG/ACT SUSP One spray each nostril once a day 23 g 3  . bismuth subsalicylate (PEPTO-BISMOL) 262 MG/15ML suspension Take 15 mLs by mouth every 6 (six) hours as needed.    . clonazePAM (KLONOPIN) 1 MG tablet Take 1 mg by mouth 2 (two) times daily.     Marland Kitchen escitalopram (LEXAPRO) 20 MG tablet Take 20 mg by mouth daily.      . pantoprazole (PROTONIX) 40 MG tablet TAKE 1 TABLET BY MOUTH  DAILY. 30 tablet 5  . simvastatin (ZOCOR) 10 MG tablet TAKE 1 TABLET BY MOUTH AT BEDTIME 30 tablet 5   No current facility-administered medications on file prior to visit.    Review of Systems  Constitutional: Negative for appetite change and unexpected weight  change.  HENT: Negative for congestion and sinus pressure.   Eyes: Negative for pain and visual disturbance.  Respiratory: Negative for cough, chest tightness and shortness of breath.   Cardiovascular: Negative for chest pain, palpitations and leg swelling.  Gastrointestinal: Negative for nausea, vomiting, abdominal pain and diarrhea.  Genitourinary: Negative for dysuria and difficulty urinating.  Musculoskeletal: Negative for back pain and joint swelling.  Skin: Negative for color change and rash.  Neurological: Negative for dizziness, light-headedness and headaches.  Hematological: Negative for adenopathy. Does not bruise/bleed easily.  Psychiatric/Behavioral: Negative for dysphoric mood and agitation.       Objective:    Physical Exam  Constitutional: She is oriented to person, place, and time. She appears well-developed and well-nourished.  HENT:  Nose: Nose normal.  Mouth/Throat: Oropharynx is clear and moist.  Eyes: Right eye exhibits no discharge. Left eye exhibits no discharge. No scleral icterus.  Neck: Neck supple. No thyromegaly present.  Cardiovascular: Normal rate and regular rhythm.   Pulmonary/Chest: Breath sounds normal. No accessory muscle usage. No tachypnea. No respiratory distress. She has no decreased breath sounds. She has no wheezes. She has no rhonchi. Right breast exhibits no inverted nipple, no mass, no nipple discharge and no tenderness (no axillary adenopathy). Left breast exhibits no inverted nipple, no  mass, no nipple discharge and no tenderness (no axilarry adenopathy).  Abdominal: Soft. Bowel sounds are normal. There is no tenderness.  Musculoskeletal: She exhibits no edema or tenderness.  Lymphadenopathy:    She has no cervical adenopathy.  Neurological: She is alert and oriented to person, place, and time.  Skin: Skin is warm. No rash noted.  Psychiatric: She has a normal mood and affect. Her behavior is normal.    BP 110/69 mmHg  Pulse 83   Temp(Src) 98.2 F (36.8 C) (Oral)  Ht 5' 4.5" (1.638 m)  Wt 151 lb (68.493 kg)  BMI 25.53 kg/m2  SpO2 97% Wt Readings from Last 3 Encounters:  09/13/14 151 lb (68.493 kg)  06/14/14 150 lb 6 oz (68.21 kg)  03/13/14 151 lb 8 oz (68.72 kg)     Lab Results  Component Value Date   WBC 4.8 11/30/2013   HGB 13.9 11/30/2013   HCT 41.4 11/30/2013   PLT 173 11/30/2013   GLUCOSE 95 09/11/2014   CHOL 159 09/11/2014   TRIG 176.0* 09/11/2014   HDL 44.40 09/11/2014   LDLCALC 79 09/11/2014   ALT 68* 09/11/2014   AST 25 09/11/2014   NA 143 09/11/2014   K 4.0 09/11/2014   CL 106 09/11/2014   CREATININE 0.93 09/11/2014   BUN 19 09/11/2014   CO2 30 09/11/2014   TSH 0.15* 09/11/2014   HGBA1C 5.2 08/30/2013       Assessment & Plan:   Problem List Items Addressed This Visit    Abnormal liver function test    Previous abdominal ultrasound revealed fatty liver.  Liver panel just checked.  ALT stable - 68.  Follow.        Anxiety    Seeing Dr Clovis Riley.       CARCINOMA, THYROID GLAND, PAPILLARY    Recent quantitative thyroglobulin and antithyroglobulin ab negative.  Have previously kept her tsh suppressed.  Confirm with her no metastatic thyroid cancer.  Will decrease synthroid to 65mcg q day.  Check tsh in 6 weeks.        Relevant Medications   levothyroxine (SYNTHROID) 88 MCG tablet   Other Relevant Orders   TSH   Environmental allergies    Dymista works well for her.  Follow.       GERD (gastroesophageal reflux disease)    Reflux controlled.  On protonix.       Health care maintenance    Physical 01/23/14.  PA 05/30/12 - negative.  Mammogram 02/08/14 - birads III.  Due f/u bilateral diagnostic mammogram.  Schedule.  Colonoscopy 08/02/14 - 6 polyps and internal hemorrhoids.       Hematuria    Has previously been referred to Dr Yves Dill for w/up.  Repeat urinalysis 09/11/14 - no rbc's.        History of malignant neoplasm of large intestine    Colonoscopy 08/02/14 as outlined.   Recommended f/u colonoscopy 07/2017.        Hypercholesterolemia    On simvastatin.  Low cholesterol diet and exercise.  Follow lipid panel and liver function tests.       Malignant neoplasm of female breast - Primary    Due f/u bilateral diagnostic mammogram.        Relevant Orders   MM Digital Diagnostic Bilat   PALPITATIONS    Not reported as an issue now.  Follow.       Peripheral neuropathy    Noticed since her chemo treatment.  Follow.  Stable.  Stress    Seeing Dr Clovis Riley.  Stable.         I spent 25 minutes with the patient and more than 50% of the time was spent in consultation regarding the above.     [name], MD

## 2014-09-13 NOTE — Progress Notes (Signed)
Pre visit review using our clinic review tool, if applicable. No additional management support is needed unless otherwise documented below in the visit note. 

## 2014-09-17 ENCOUNTER — Encounter: Payer: Self-pay | Admitting: Internal Medicine

## 2014-09-17 NOTE — Assessment & Plan Note (Signed)
Has previously been referred to Dr Yves Dill for w/up.  Repeat urinalysis 09/11/14 - no rbc's.

## 2014-09-17 NOTE — Assessment & Plan Note (Signed)
Seeing Dr Levine.  Stable.   

## 2014-09-17 NOTE — Assessment & Plan Note (Signed)
Colonoscopy 08/02/14 as outlined.  Recommended f/u colonoscopy 07/2017.

## 2014-09-17 NOTE — Assessment & Plan Note (Signed)
Noticed since her chemo treatment.  Follow.  Stable.

## 2014-09-17 NOTE — Assessment & Plan Note (Signed)
Not reported as an issue now.  Follow.

## 2014-09-17 NOTE — Assessment & Plan Note (Signed)
On simvastatin.  Low cholesterol diet and exercise.  Follow lipid panel and liver function tests.   

## 2014-09-17 NOTE — Assessment & Plan Note (Addendum)
Physical 01/23/14.  PA 05/30/12 - negative.  Mammogram 02/08/14 - birads III.  Due f/u bilateral diagnostic mammogram.  Schedule.  Colonoscopy 08/02/14 - 6 polyps and internal hemorrhoids.

## 2014-09-17 NOTE — Assessment & Plan Note (Signed)
Recent quantitative thyroglobulin and antithyroglobulin ab negative.  Have previously kept her tsh suppressed.  Confirm with her no metastatic thyroid cancer.  Will decrease synthroid to 53mcg q day.  Check tsh in 6 weeks.

## 2014-09-17 NOTE — Assessment & Plan Note (Signed)
Previous abdominal ultrasound revealed fatty liver.  Liver panel just checked.  ALT stable - 68.  Follow.

## 2014-09-17 NOTE — Assessment & Plan Note (Signed)
Reflux controlled.  On protonix.

## 2014-09-17 NOTE — Assessment & Plan Note (Signed)
Seeing Dr Clovis Riley.

## 2014-09-17 NOTE — Assessment & Plan Note (Signed)
Dymista works well for her.  Follow.

## 2014-09-17 NOTE — Assessment & Plan Note (Signed)
Due f/u bilateral diagnostic mammogram.

## 2014-09-25 ENCOUNTER — Other Ambulatory Visit: Payer: Self-pay | Admitting: Internal Medicine

## 2014-09-25 DIAGNOSIS — Z853 Personal history of malignant neoplasm of breast: Secondary | ICD-10-CM

## 2014-09-25 NOTE — Progress Notes (Signed)
Order placed for left and right breast ultrasound per radiology request.

## 2014-10-04 ENCOUNTER — Ambulatory Visit
Admission: RE | Admit: 2014-10-04 | Discharge: 2014-10-04 | Disposition: A | Discharge status: Home / Self Care | Payer: PPO | Source: Ambulatory Visit | Attending: Internal Medicine | Admitting: Internal Medicine

## 2014-10-04 ENCOUNTER — Other Ambulatory Visit: Payer: PPO

## 2014-10-04 ENCOUNTER — Ambulatory Visit: Payer: PPO

## 2014-10-04 ENCOUNTER — Other Ambulatory Visit: Payer: Self-pay | Admitting: Internal Medicine

## 2014-10-04 DIAGNOSIS — Z853 Personal history of malignant neoplasm of breast: Secondary | ICD-10-CM | POA: Insufficient documentation

## 2014-10-04 DIAGNOSIS — Z9889 Other specified postprocedural states: Secondary | ICD-10-CM | POA: Insufficient documentation

## 2014-10-04 DIAGNOSIS — C50919 Malignant neoplasm of unspecified site of unspecified female breast: Secondary | ICD-10-CM

## 2014-10-25 ENCOUNTER — Other Ambulatory Visit (INDEPENDENT_AMBULATORY_CARE_PROVIDER_SITE_OTHER): Payer: PPO

## 2014-10-25 DIAGNOSIS — C73 Malignant neoplasm of thyroid gland: Secondary | ICD-10-CM

## 2014-10-25 LAB — TSH: TSH: 0.44 u[IU]/mL (ref 0.35–4.50)

## 2014-10-29 ENCOUNTER — Encounter: Payer: Self-pay | Admitting: *Deleted

## 2014-11-26 ENCOUNTER — Other Ambulatory Visit: Payer: PPO

## 2014-11-26 ENCOUNTER — Ambulatory Visit: Payer: PPO | Admitting: Internal Medicine

## 2014-11-27 ENCOUNTER — Other Ambulatory Visit: Payer: Self-pay | Admitting: Internal Medicine

## 2014-12-05 ENCOUNTER — Other Ambulatory Visit: Payer: Self-pay | Admitting: Internal Medicine

## 2014-12-11 ENCOUNTER — Inpatient Hospital Stay (HOSPITAL_BASED_OUTPATIENT_CLINIC_OR_DEPARTMENT_OTHER): Payer: PPO | Admitting: Internal Medicine

## 2014-12-11 ENCOUNTER — Other Ambulatory Visit: Payer: Self-pay | Admitting: *Deleted

## 2014-12-11 ENCOUNTER — Inpatient Hospital Stay: Payer: PPO | Attending: Internal Medicine

## 2014-12-11 VITALS — BP 115/69 | HR 83 | Temp 97.6°F | Resp 18 | Ht 64.5 in | Wt 152.0 lb

## 2014-12-11 DIAGNOSIS — Z85038 Personal history of other malignant neoplasm of large intestine: Secondary | ICD-10-CM

## 2014-12-11 DIAGNOSIS — Z9221 Personal history of antineoplastic chemotherapy: Secondary | ICD-10-CM | POA: Insufficient documentation

## 2014-12-11 DIAGNOSIS — Z79899 Other long term (current) drug therapy: Secondary | ICD-10-CM | POA: Diagnosis not present

## 2014-12-11 DIAGNOSIS — Z923 Personal history of irradiation: Secondary | ICD-10-CM | POA: Insufficient documentation

## 2014-12-11 DIAGNOSIS — C50911 Malignant neoplasm of unspecified site of right female breast: Secondary | ICD-10-CM

## 2014-12-11 DIAGNOSIS — Z853 Personal history of malignant neoplasm of breast: Secondary | ICD-10-CM

## 2014-12-11 DIAGNOSIS — C180 Malignant neoplasm of cecum: Secondary | ICD-10-CM

## 2014-12-11 DIAGNOSIS — Z170 Estrogen receptor positive status [ER+]: Secondary | ICD-10-CM | POA: Insufficient documentation

## 2014-12-11 DIAGNOSIS — C50919 Malignant neoplasm of unspecified site of unspecified female breast: Secondary | ICD-10-CM

## 2014-12-11 LAB — CBC WITH DIFFERENTIAL/PLATELET
BASOS ABS: 0 10*3/uL (ref 0–0.1)
BASOS PCT: 1 %
EOS ABS: 0.1 10*3/uL (ref 0–0.7)
EOS PCT: 3 %
HCT: 41.8 % (ref 35.0–47.0)
Hemoglobin: 14.5 g/dL (ref 12.0–16.0)
LYMPHS ABS: 1.1 10*3/uL (ref 1.0–3.6)
Lymphocytes Relative: 25 %
MCH: 30.1 pg (ref 26.0–34.0)
MCHC: 34.7 g/dL (ref 32.0–36.0)
MCV: 87 fL (ref 80.0–100.0)
Monocytes Absolute: 0.3 10*3/uL (ref 0.2–0.9)
Monocytes Relative: 7 %
Neutro Abs: 2.9 10*3/uL (ref 1.4–6.5)
Neutrophils Relative %: 64 %
Platelets: 191 10*3/uL (ref 150–440)
RBC: 4.8 MIL/uL (ref 3.80–5.20)
RDW: 13.8 % (ref 11.5–14.5)
WBC: 4.4 10*3/uL (ref 3.6–11.0)

## 2014-12-11 NOTE — Progress Notes (Signed)
Patient is here for follow-up of breast cancer. She states that overall she is doing well and has no complaints. She states that her last mammogram was in March/April 2016.

## 2014-12-12 LAB — CA 27.29 (SERIAL MONITOR): CA 27.29: 20.4 U/mL (ref 0.0–38.6)

## 2014-12-12 LAB — CEA: CEA: 2.4 ng/mL (ref 0.0–4.7)

## 2014-12-23 NOTE — Progress Notes (Signed)
[facility]  Telephone:(336) [phone] Fax:(336) [phone]     ID: EVE REY OB: 1950/11/02  MR#: [id number]  HUT#:[id number]  Patient Care Team: [name], MD as PCP - General (Internal Medicine)  CHIEF COMPLAINT/DIAGNOSIS:  1.  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 Status post adjuvant chemotherapy and radiation therapy (completed treatment end of 2006). 2.  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 On Neurontin.  3. 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 Tumor size 1.6 cm, margins negative. ER and PR positive. HER-2/neu negative ONCOTYPE Dx test November 2009 showed score of 24 (Intermediate Risk,  average rate of distant recurrence of 16%). Started adjuvant hormonal therapy with anastrozole in June 2010, switched to exemestane in September 2010     HISTORY OF PRESENT ILLNESS:  Patient returns for continued oncology followup, she was seen in July 2015. States she is doing steady, denies feeling any new breast masses on self exam. She has completed 5 years of exemestane in june 2015. Denies new joint pains, remains physically active and ambulatory. Chronic paresthesias in hands and feet are slowly getting better. Denies new constipation, bright-red blood in stools, or melena. No pain issues, 0/10.    REVIEW OF SYSTEMS:   ROS As in HPI above. In addition, no fever, chills or sweats. No new headaches or focal weakness.  No new mood disturbances. No  sore throat, cough, shortness of breath, sputum, hemoptysis or chest pain. No dizziness or palpitation. No abdominal pain, dysuria or hematuria. No new skin rash or bleeding symptoms. No new paresthesias in extremities.    PAST MEDICAL HISTORY: Reviewed. Past Medical History  Diagnosis Date  . Palpitations   . Other and  unspecified hyperlipidemia   . Unspecified hereditary and idiopathic peripheral neuropathy   . Personal history of malignant neoplasm of large intestine     carcinoma - cecum, s/p right laparoscopic colectomy - s/p chemotherapy and XRT  . Anxiety   . Liver nodule     s/p negative biopsy  . Nephrolithiasis     s/p lithotripsy  . GERD (gastroesophageal reflux disease)   . Osteoporosis   . Malignant neoplasm of breast (female), unspecified site     s/p lumpectomy  . Malignant neoplasm of thyroid gland     s/p surgery and XRT          History of benign nodule in the right of the liver, biopsy over 10 years ago, report not available at this time  [id number] adenocarcinoma of the cecum, status post right colectomy in December 2005. Status post adjuvant chemotherapy and radiation  therapy (completed treatment end of 2006).  Colonoscopy March 2011 reported negative for malignancy.  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 Tumor size 1.6 cm, margins negative. ER and PR positive. HER-2/neu negative.  PAST SURGICAL HISTORY: Reviewed. Past Surgical History  Procedure Laterality Date  . Appendectomy  1985  . Cholecystectomy  1995  . Thyroid lobectomy  2002    s/p XRT  . Laparoscopic partial colectomy      stage 3-C carcinoma of the cecum,  s/p chemotherapy and xrt  . Lithotripsy    . Dilation and curettage of uterus  1990  . Breast lumpectomy  2009    breast cancer    FAMILY HISTORY: Reviewed. Family History  Problem Relation Age of Onset  . Stroke Mother     10s  . Lung cancer Father   . Prostate cancer Father   . Breast cancer Sister   . Lung cancer Sister   . Breast cancer      aunt    SOCIAL HISTORY: Reviewed. Social History  Substance Use Topics  .  Smoking status: Never Smoker   . Smokeless tobacco: Never Used  . Alcohol Use: No    Allergies  Allergen Reactions  . Bentyl [Dicyclomine Hcl]   . Ciprofloxacin Diarrhea  . Codeine Other (See Comments)    dizziness    . Epinephrine   . Flagyl [Metronidazole]   . Librax [Chlordiazepoxide-Clidinium]   . Phenobarbital   . Tetracyclines & Related Other (See Comments)    Throat swells   . Ultram [Tramadol] Other (See Comments)    Sick feeling    Current Outpatient Prescriptions  Medication Sig Dispense Refill  . Azelastine-Fluticasone 137-50 MCG/ACT SUSP One spray each nostril once a day 23 g 3  . bismuth subsalicylate (PEPTO-BISMOL) 262 MG/15ML suspension Take 15 mLs by mouth every 6 (six) hours as needed.    . clonazePAM (KLONOPIN) 1 MG tablet Take 1 mg by mouth 2 (two) times daily.     Marland Kitchen escitalopram (LEXAPRO) 20 MG tablet Take 20 mg by mouth daily.      Marland Kitchen levothyroxine (SYNTHROID) 88 MCG tablet Take 1 tablet (88 mcg total) by mouth daily before breakfast. 30 tablet 3  . pantoprazole (PROTONIX) 40 MG tablet TAKE 1 TABLET BY MOUTH  DAILY. 30 tablet 5  . simvastatin (ZOCOR) 10 MG tablet TAKE 1 TABLET BY MOUTH AT BEDTIME 30 tablet 5  . levothyroxine (SYNTHROID, LEVOTHROID) 100 MCG tablet TAKE 1 TABLET (100 MCG TOTAL) BY MOUTH DAILY. (Patient not taking: Reported on 12/11/2014) 90 tablet 3   No current facility-administered medications for this visit.    PHYSICAL EXAM: Filed Vitals:   12/11/14 1603  BP: 115/69  Pulse: 83  Temp: 97.6 F (36.4 C)  Resp: 18     Body mass index is 25.7 kg/(m^2).    ECOG FS:1 - Symptomatic but completely ambulatory  GENERAL: Alert and oriented and in no acute distress. There is no icterus. HEENT: EOMs intact.  No cervical lymphadenopathy. CVS: S1S2, regular LUNGS: Bilaterally clear to auscultation, no rhonchi. ABDOMEN: Soft, nontender. No hepatomegaly clinically.  NEURO: grossly nonfocal, cranial nerves are intact.   EXTREMITIES: No pedal  edema. BREASTS: no abnormal masses in either breast.  No axillary adenopathy on either side. Exam performed in the presence of a nurse.   LAB RESULTS: CEA 2.4,   CA 27.29 is 20.4.    Component Value Date/Time   NA 143 09/11/2014 0810   NA 145 11/30/2013 1419   K 4.0 09/11/2014 0810   K 3.5 11/30/2013 1419   CL 106 09/11/2014 0810   CL 109* 11/30/2013 1419   CO2 30 09/11/2014 0810   CO2 29 11/30/2013 1419   GLUCOSE 95 09/11/2014 0810   GLUCOSE 97 11/30/2013 1419   BUN 19 09/11/2014 0810   BUN 15 11/30/2013 1419   CREATININE 0.93 09/11/2014 0810   CREATININE 1.04 11/30/2013 1419   CALCIUM 9.2 09/11/2014 0810   CALCIUM 8.7 11/30/2013 1419  PROT 6.3 09/11/2014 0810   PROT 7.1 11/30/2013 1419   ALBUMIN 3.8 09/11/2014 0810   ALBUMIN 3.3* 11/30/2013 1419   AST 25 09/11/2014 0810   AST 28 11/30/2013 1419   ALT 68* 09/11/2014 0810   ALT 71* 11/30/2013 1419   ALKPHOS 99 09/11/2014 0810   ALKPHOS 114 11/30/2013 1419   BILITOT 0.6 09/11/2014 0810   BILITOT 0.7 11/30/2013 1419   GFRNONAA 57* 11/30/2013 1419   GFRNONAA 55* 05/22/2011 1405   GFRAA >60 11/30/2013 1419   GFRAA >60 05/22/2011 1405    Lab Results  Component Value Date   WBC 4.4 12/11/2014   NEUTROABS 2.9 12/11/2014   HGB 14.5 12/11/2014   HCT 41.8 12/11/2014   MCV 87.0 12/11/2014   PLT 191 12/11/2014    Lab Results  Component Value Date   IRON 95 08/30/2013      STUDIES: Oct 2015 - Right Mammogram. IMPRESSION: Probably benign right breast finding.  RECOMMENDATION:  Bilateral diagnostic mammogram in April 2016.  BI-RADS CATEGORY  3: Probably benign finding(s) - short interval follow-up suggested.   ASSESSMENT / PLAN:   1. 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 She is doing steady with no clinical evidence to suggest recurrent/metastatic breast cancer. Serum CA 27-29 from today is normal. She has completed  5 years of hormonal therapy with exemestane. She was also reminded to continue taking calcium and vitamin D supplement twice daily to prevent osteoporosis. Right breast diagnostic mammogram in October 2015 recommended short interval followup, patient states she had repeat mammogram in June 2016 which was unremarkable. Plan is conitnued surveillance, will get next annual mammogram in June 2017. Will see her back in one year with CBC, Cr, LFT, CEA, CA 27.29. 2. History of colon cancer - CEA remains normal range. She is doing well with no clinical evidence to suggest recurrent or metastatic colon cancer. Colonoscopy in 2011 was negative for malignancy, states she follows with GI. Most recent EGD had showed only hyperplastic gastric polyp. Continue surveillance. Repeat CEA level upon next visit here in one year.  3.   In between visits, the patient was advised to call in case of any new side effects from exemestane, new breast masses felt on self exam, or             other new symptoms. She is agreeable to this plan.     Leia Alf, MD   12/23/2014 12:30 PM

## 2014-12-27 LAB — HEPATIC FUNCTION PANEL
ALBUMIN: 4 g/dL (ref 3.5–5.0)
ALT: 75 U/L — ABNORMAL HIGH (ref 14–54)
AST: 33 U/L (ref 15–41)
Alkaline Phosphatase: 97 U/L (ref 38–126)
Bilirubin, Direct: <0.1 mg/dL — ABNORMAL LOW (ref 0.1–0.5)
TOTAL PROTEIN: 7 g/dL (ref 6.5–8.1)
Total Bilirubin: 0.7 mg/dL (ref 0.3–1.2)

## 2014-12-27 LAB — CREATININE, SERUM
CREATININE: 1.15 mg/dL — AB (ref 0.44–1.00)
GFR, EST AFRICAN AMERICAN: 57 mL/min — AB (ref >60)
GFR, EST NON AFRICAN AMERICAN: 49 mL/min — AB (ref >60)

## 2015-01-01 ENCOUNTER — Telehealth: Payer: Self-pay | Admitting: *Deleted

## 2015-01-01 NOTE — Telephone Encounter (Signed)
Pt called states she would like to discuss her lab results from the cancer center.  Please advise if pt needs appoint.

## 2015-01-02 NOTE — Telephone Encounter (Signed)
Notify pt that I reviewed her labs.  I want her to stop her cholesterol medication for now.  We will follow her liver tests.  One liver test is slightly increased.  We have previously checked an abdominal ultrasound and this revealed fatty liver.  This could be the reason for the mild elevation in her liver function test, but I would like to stop the cholesterol medication for now and recheck her labs to see if any improvement.  Stay hydrated.  She has an appt with me in September.  If she feels needs to be seen earlier, let me know.  Avoid antiinflammatories.  Will recheck labs at appt.

## 2015-01-02 NOTE — Telephone Encounter (Signed)
Please schedule her an appt for 01/04/15 at 9:30.  Block 30 minutes.  Notify her of appt.  Also block the 12:00 appt on 01/04/15.

## 2015-01-02 NOTE — Telephone Encounter (Signed)
Spoke with pt, she does want to be seen earlier.  She states her best days are Tuesdays and Thursdays.  Please advise

## 2015-01-02 NOTE — Telephone Encounter (Signed)
Unable to contact pt, no answer on cell or work phone.  Left number on cell as a return call number

## 2015-01-02 NOTE — Telephone Encounter (Signed)
Spoke with pt, advised of appoint.

## 2015-01-04 ENCOUNTER — Encounter: Payer: Self-pay | Admitting: Internal Medicine

## 2015-01-04 ENCOUNTER — Ambulatory Visit (INDEPENDENT_AMBULATORY_CARE_PROVIDER_SITE_OTHER): Payer: PPO | Admitting: Internal Medicine

## 2015-01-04 VITALS — BP 110/70 | HR 79 | Temp 97.9°F | Ht 64.5 in | Wt 152.5 lb

## 2015-01-04 DIAGNOSIS — E7800 Pure hypercholesterolemia, unspecified: Secondary | ICD-10-CM

## 2015-01-04 DIAGNOSIS — R945 Abnormal results of liver function studies: Secondary | ICD-10-CM

## 2015-01-04 DIAGNOSIS — C50919 Malignant neoplasm of unspecified site of unspecified female breast: Secondary | ICD-10-CM | POA: Diagnosis not present

## 2015-01-04 DIAGNOSIS — R748 Abnormal levels of other serum enzymes: Secondary | ICD-10-CM

## 2015-01-04 DIAGNOSIS — R7989 Other specified abnormal findings of blood chemistry: Secondary | ICD-10-CM | POA: Diagnosis not present

## 2015-01-04 DIAGNOSIS — R198 Other specified symptoms and signs involving the digestive system and abdomen: Secondary | ICD-10-CM

## 2015-01-04 DIAGNOSIS — Z85038 Personal history of other malignant neoplasm of large intestine: Secondary | ICD-10-CM

## 2015-01-04 DIAGNOSIS — K219 Gastro-esophageal reflux disease without esophagitis: Secondary | ICD-10-CM

## 2015-01-04 DIAGNOSIS — R194 Change in bowel habit: Secondary | ICD-10-CM

## 2015-01-04 LAB — CREATININE, SERUM: Creatinine, Ser: 1 mg/dL (ref 0.40–1.20)

## 2015-01-04 NOTE — Progress Notes (Signed)
Pre-visit discussion using our clinic review tool. No additional management support is needed unless otherwise documented below in the visit note.  

## 2015-01-04 NOTE — Progress Notes (Signed)
Patient ID: Debra Hale, female   DOB: 08/12/1950, 64 y.o.   MRN: [id number]   Subjective:    Patient ID: Debra Hale, female    DOB: 1951/04/28, 64 y.o.   MRN: [id number]  HPI  Patient here for a scheduled follow up.  Wanted to go over some of her labs - drawn from the cancer center.  ALT - 75 on recent liver panel.  Remainder of liver panel wnl.  Cr slightly increased.  Discussed importance of staying hydrated.  Has had abdominal ultrasound previously.  Found to have fatty liver.  She feels her bowels are worse.  States after she eats, has to go to bathroom immediately.  Takes immodium.  We discussed taking a probiotic.     Past Medical History  Diagnosis Date  . Palpitations   . Other and unspecified hyperlipidemia   . Unspecified hereditary and idiopathic peripheral neuropathy   . Personal history of malignant neoplasm of large intestine     carcinoma - cecum, s/p right laparoscopic colectomy - s/p chemotherapy and XRT  . Anxiety   . Liver nodule     s/p negative biopsy  . Nephrolithiasis     s/p lithotripsy  . GERD (gastroesophageal reflux disease)   . Osteoporosis   . Malignant neoplasm of breast (female), unspecified site     s/p lumpectomy  . Malignant neoplasm of thyroid gland     s/p surgery and XRT   Past Surgical History  Procedure Laterality Date  . Appendectomy  1985  . Cholecystectomy  1995  . Thyroid lobectomy  2002    s/p XRT  . Laparoscopic partial colectomy      stage 3-C carcinoma of the cecum, s/p chemotherapy and xrt  . Lithotripsy    . Dilation and curettage of uterus  1990  . Breast lumpectomy  2009    breast cancer   Family History  Problem Relation Age of Onset  . Stroke Mother     31s  . Lung cancer Father   . Prostate cancer Father   . Breast cancer Sister   . Lung cancer Sister   . Breast cancer      aunt   Social History   Social History  . Marital Status: Divorced    Spouse Name: N/A  . Number of Children: 0  . Years of  Education: N/A   Social History Main Topics  . Smoking status: Never Smoker   . Smokeless tobacco: Never Used  . Alcohol Use: No  . Drug Use: No  . Sexual Activity: Not Asked   Other Topics Concern  . None   Social History Narrative    Outpatient Encounter Prescriptions as of 01/04/2015  Medication Sig  . Azelastine-Fluticasone 137-50 MCG/ACT SUSP One spray each nostril once a day  . bismuth subsalicylate (PEPTO-BISMOL) 262 MG/15ML suspension Take 15 mLs by mouth every 6 (six) hours as needed.  . clonazePAM (KLONOPIN) 1 MG tablet Take 1 mg by mouth 2 (two) times daily.   Marland Kitchen escitalopram (LEXAPRO) 20 MG tablet Take 20 mg by mouth daily.    Marland Kitchen levothyroxine (SYNTHROID) 88 MCG tablet Take 1 tablet (88 mcg total) by mouth daily before breakfast.  . pantoprazole (PROTONIX) 40 MG tablet TAKE 1 TABLET BY MOUTH  DAILY.  . [DISCONTINUED] levothyroxine (SYNTHROID, LEVOTHROID) 100 MCG tablet TAKE 1 TABLET (100 MCG TOTAL) BY MOUTH DAILY.  . simvastatin (ZOCOR) 10 MG tablet TAKE 1 TABLET BY MOUTH AT BEDTIME (Patient not taking: Reported  on 01/04/2015)   No facility-administered encounter medications on file as of 01/04/2015.    Review of Systems  Constitutional: Negative for appetite change and unexpected weight change.  HENT: Negative for congestion and sinus pressure.   Respiratory: Negative for cough, chest tightness and shortness of breath.   Cardiovascular: Negative for chest pain, palpitations and leg swelling.  Gastrointestinal: Positive for diarrhea (loose bowel movements after eating.  ). Negative for nausea, vomiting and abdominal pain.  Genitourinary: Negative for dysuria and difficulty urinating.  Skin: Negative for color change and rash.  Neurological: Negative for dizziness, light-headedness and headaches.  Psychiatric/Behavioral: Negative for dysphoric mood and agitation.       Objective:    Physical Exam  Constitutional: She appears well-developed and well-nourished. No  distress.  HENT:  Nose: Nose normal.  Mouth/Throat: Oropharynx is clear and moist.  Eyes: Conjunctivae are normal. Right eye exhibits no discharge. Left eye exhibits no discharge.  Neck: Neck supple. No thyromegaly present.  Cardiovascular: Normal rate and regular rhythm.   Pulmonary/Chest: Breath sounds normal. No respiratory distress. She has no wheezes.  Abdominal: Soft. Bowel sounds are normal. There is no tenderness.  Musculoskeletal: She exhibits no edema or tenderness.  Lymphadenopathy:    She has no cervical adenopathy.  Skin: No rash noted. No erythema.  Psychiatric: She has a normal mood and affect. Her behavior is normal.    BP 110/70 mmHg  Pulse 79  Temp(Src) 97.9 F (36.6 C) (Oral)  Ht 5' 4.5" (1.638 m)  Wt 152 lb 8 oz (69.174 kg)  BMI 25.78 kg/m2  SpO2 96% Wt Readings from Last 3 Encounters:  01/04/15 152 lb 8 oz (69.174 kg)  12/11/14 152 lb (68.947 kg)  09/13/14 151 lb (68.493 kg)     Lab Results  Component Value Date   WBC 4.4 12/11/2014   HGB 14.5 12/11/2014   HCT 41.8 12/11/2014   PLT 191 12/11/2014   GLUCOSE 95 09/11/2014   CHOL 159 09/11/2014   TRIG 176.0* 09/11/2014   HDL 44.40 09/11/2014   LDLCALC 79 09/11/2014   ALT 75* 12/11/2014   AST 33 12/11/2014   NA 143 09/11/2014   K 4.0 09/11/2014   CL 106 09/11/2014   CREATININE 1.00 01/04/2015   BUN 19 09/11/2014   CO2 30 09/11/2014   TSH 0.44 10/25/2014   HGBA1C 5.2 08/30/2013    Mm Diag Breast Tomo Bilateral  10/04/2014   CLINICAL DATA:  Patient with history of right breast lumpectomy 2009.  EXAM: DIGITAL DIAGNOSTIC BILATERAL MAMMOGRAM WITH 3D TOMOSYNTHESIS AND CAD  COMPARISON:  [facility]  ACR Breast Density Category c: The breast tissue is heterogeneously dense, which may obscure small masses.  FINDINGS: Stable postlumpectomy changes right breast. No concerning masses, calcifications or nonsurgical architectural distortion identified within either breast.  Mammographic images were processed with  CAD.  IMPRESSION: Stable postlumpectomy changes right breast. Stable right breast fibroglandular pattern.  RECOMMENDATION: Screening mammogram in one year.(Code:SM-B-01Y)  I have discussed the findings and recommendations with the patient. Results were also provided in writing at the conclusion of the visit. If applicable, a reminder letter will be sent to the patient regarding the next appointment.  BI-RADS CATEGORY  2: Benign.   Electronically Signed   By: Lovey Newcomer M.D.   On: 10/04/2014 12:10       Assessment & Plan:   Problem List Items Addressed This Visit    Abnormal liver function test - Primary    Previous abdominal ultrasound revealed fatty  liver.  Liver panel recently checked and revealed slightly elevated ALT.  Discussed low cholesterol diet and exercise.  Discussed weight loss.  Recheck liver panel in a few weeks.  Refer back to GI to confirm no further w/up warranted.        Relevant Orders   Ambulatory referral to Gastroenterology   Elevated serum creatinine    Stay hydrated.  Recheck today.        GERD (gastroesophageal reflux disease)    Controlled.  On protonix.        History of malignant neoplasm of large intestine    Colonoscopy 08/02/14 as outlined.  Recommended f/u colonoscopy in 07/2017.  Bowels as outlined.  Given persistent issues, refer back to GI.  Probiotic as directed.  Follow.        Hypercholesterolemia    On simvastatin.  Low cholesterol diet and exercise.  Follow lipid panel.       Malignant neoplasm of female breast    Mammogram 10/04/14 - [facility].        Relevant Orders   Creatinine, serum (Completed)    Other Visit Diagnoses    Change in bowel function        Relevant Orders    Ambulatory referral to Gastroenterology        [name], MD

## 2015-01-05 ENCOUNTER — Encounter: Payer: Self-pay | Admitting: Internal Medicine

## 2015-01-05 ENCOUNTER — Other Ambulatory Visit: Payer: Self-pay | Admitting: Internal Medicine

## 2015-01-05 DIAGNOSIS — R7989 Other specified abnormal findings of blood chemistry: Secondary | ICD-10-CM | POA: Insufficient documentation

## 2015-01-05 NOTE — Assessment & Plan Note (Signed)
On simvastatin.  Low cholesterol diet and exercise.  Follow lipid panel.  

## 2015-01-05 NOTE — Assessment & Plan Note (Signed)
Stay hydrated.  Recheck today.

## 2015-01-05 NOTE — Assessment & Plan Note (Signed)
Controlled.  On protonix.   

## 2015-01-05 NOTE — Assessment & Plan Note (Signed)
Mammogram 10/04/14 - [facility] II.

## 2015-01-05 NOTE — Assessment & Plan Note (Signed)
Previous abdominal ultrasound revealed fatty liver.  Liver panel recently checked and revealed slightly elevated ALT.  Discussed low cholesterol diet and exercise.  Discussed weight loss.  Recheck liver panel in a few weeks.  Refer back to GI to confirm no further w/up warranted.

## 2015-01-05 NOTE — Assessment & Plan Note (Signed)
Colonoscopy 08/02/14 as outlined.  Recommended f/u colonoscopy in 07/2017.  Bowels as outlined.  Given persistent issues, refer back to GI.  Probiotic as directed.  Follow.

## 2015-01-08 ENCOUNTER — Encounter: Payer: Self-pay | Admitting: *Deleted

## 2015-01-28 ENCOUNTER — Ambulatory Visit (INDEPENDENT_AMBULATORY_CARE_PROVIDER_SITE_OTHER): Payer: PPO | Admitting: Internal Medicine

## 2015-01-28 ENCOUNTER — Encounter: Payer: Self-pay | Admitting: Internal Medicine

## 2015-01-28 VITALS — BP 110/70 | HR 82 | Temp 98.2°F | Resp 18 | Ht 64.5 in | Wt 152.2 lb

## 2015-01-28 DIAGNOSIS — E7800 Pure hypercholesterolemia, unspecified: Secondary | ICD-10-CM

## 2015-01-28 DIAGNOSIS — Z0000 Encounter for general adult medical examination without abnormal findings: Secondary | ICD-10-CM

## 2015-01-28 DIAGNOSIS — C73 Malignant neoplasm of thyroid gland: Secondary | ICD-10-CM

## 2015-01-28 DIAGNOSIS — C50919 Malignant neoplasm of unspecified site of unspecified female breast: Secondary | ICD-10-CM

## 2015-01-28 DIAGNOSIS — K219 Gastro-esophageal reflux disease without esophagitis: Secondary | ICD-10-CM

## 2015-01-28 DIAGNOSIS — F419 Anxiety disorder, unspecified: Secondary | ICD-10-CM

## 2015-01-28 DIAGNOSIS — Z9109 Other allergy status, other than to drugs and biological substances: Secondary | ICD-10-CM

## 2015-01-28 DIAGNOSIS — R945 Abnormal results of liver function studies: Secondary | ICD-10-CM

## 2015-01-28 DIAGNOSIS — F32A Depression, unspecified: Secondary | ICD-10-CM

## 2015-01-28 DIAGNOSIS — Z91048 Other nonmedicinal substance allergy status: Secondary | ICD-10-CM

## 2015-01-28 DIAGNOSIS — Z85038 Personal history of other malignant neoplasm of large intestine: Secondary | ICD-10-CM | POA: Diagnosis not present

## 2015-01-28 DIAGNOSIS — R7989 Other specified abnormal findings of blood chemistry: Secondary | ICD-10-CM | POA: Diagnosis not present

## 2015-01-28 DIAGNOSIS — M810 Age-related osteoporosis without current pathological fracture: Secondary | ICD-10-CM

## 2015-01-28 DIAGNOSIS — F320 Major depressive disorder, single episode, mild: Secondary | ICD-10-CM

## 2015-01-28 DIAGNOSIS — F329 Major depressive disorder, single episode, unspecified: Secondary | ICD-10-CM

## 2015-01-28 NOTE — Progress Notes (Signed)
Pre-visit discussion using our clinic review tool. No additional management support is needed unless otherwise documented below in the visit note.  

## 2015-01-28 NOTE — Progress Notes (Signed)
Patient ID: Debra Hale, female   DOB: 1951/01/30, 64 y.o.   MRN: [id number]   Subjective:    Patient ID: Debra Hale, female    DOB: 01/06/1951, 64 y.o.   MRN: [id number]  HPI  Patient with past history of GERD, colon cancer, breast cancer and thyroid cancer.  Here today fo f/u on these issues as well as for a physical exam.  She reports some fatigue.  Some intermittent depression.  Discussed at length with her today.  She desires no further intervention.  Sees psychiatry.  We discussed going out with a couple of close friends, etc.  Tries to stay active.  No cardiac symptoms with increased activity or exertion.  No sob.  Has noticed that she has to go to the bathroom after eating.  Worse over the last year.  immodium helps.     Past Medical History  Diagnosis Date  . Palpitations   . Other and unspecified hyperlipidemia   . Unspecified hereditary and idiopathic peripheral neuropathy   . Personal history of malignant neoplasm of large intestine     carcinoma - cecum, s/p right laparoscopic colectomy - s/p chemotherapy and XRT  . Anxiety   . Liver nodule     s/p negative biopsy  . Nephrolithiasis     s/p lithotripsy  . GERD (gastroesophageal reflux disease)   . Osteoporosis   . Malignant neoplasm of breast (female), unspecified site     s/p lumpectomy  . Malignant neoplasm of thyroid gland [facility])     s/p surgery and XRT   Past Surgical History  Procedure Laterality Date  . Appendectomy  1985  . Cholecystectomy  1995  . Thyroid lobectomy  2002    s/p XRT  . Laparoscopic partial colectomy      stage 3-C carcinoma of the cecum, s/p chemotherapy and xrt  . Lithotripsy    . Dilation and curettage of uterus  1990  . Breast lumpectomy  2009    breast cancer   Family History  Problem Relation Age of Onset  . Stroke Mother     38s  . Lung cancer Father   . Prostate cancer Father   . Breast cancer Sister   . Lung cancer Sister   . Breast cancer      aunt   Social History    Social History  . Marital Status: Divorced    Spouse Name: N/A  . Number of Children: 0  . Years of Education: N/A   Social History Main Topics  . Smoking status: Never Smoker   . Smokeless tobacco: Never Used  . Alcohol Use: No  . Drug Use: No  . Sexual Activity: Not Asked   Other Topics Concern  . None   Social History Narrative    Outpatient Encounter Prescriptions as of 01/28/2015  Medication Sig  . Azelastine-Fluticasone 137-50 MCG/ACT SUSP One spray each nostril once a day  . bismuth subsalicylate (PEPTO-BISMOL) 262 MG/15ML suspension Take 15 mLs by mouth every 6 (six) hours as needed.  . clonazePAM (KLONOPIN) 1 MG tablet Take 1 mg by mouth 2 (two) times daily.   Marland Kitchen escitalopram (LEXAPRO) 20 MG tablet Take 20 mg by mouth daily.    Marland Kitchen levothyroxine (SYNTHROID, LEVOTHROID) 88 MCG tablet TAKE 1 TABLET (88 MCG TOTAL) BY MOUTH DAILY BEFORE BREAKFAST.  . pantoprazole (PROTONIX) 40 MG tablet TAKE 1 TABLET BY MOUTH  DAILY.  . [DISCONTINUED] simvastatin (ZOCOR) 10 MG tablet TAKE 1 TABLET BY MOUTH AT BEDTIME  No facility-administered encounter medications on file as of 01/28/2015.    Review of Systems  Constitutional: Positive for fatigue. Negative for unexpected weight change.  HENT: Negative for congestion and sinus pressure.   Eyes: Negative for pain and visual disturbance.  Respiratory: Negative for cough, chest tightness and shortness of breath.   Cardiovascular: Negative for chest pain, palpitations and leg swelling.  Gastrointestinal: Negative for nausea, vomiting and abdominal pain.       Bowel movements after eating - as outlined.   Genitourinary: Negative for dysuria and difficulty urinating.  Musculoskeletal: Negative for back pain and joint swelling.  Skin: Negative for color change and rash.  Neurological: Negative for dizziness, light-headedness and headaches.  Hematological: Negative for adenopathy. Does not bruise/bleed easily.  Psychiatric/Behavioral:  Negative for dysphoric mood and agitation.       Objective:    Physical Exam  Constitutional: She is oriented to person, place, and time. She appears well-developed and well-nourished. No distress.  HENT:  Nose: Nose normal.  Mouth/Throat: Oropharynx is clear and moist.  Eyes: Right eye exhibits no discharge. Left eye exhibits no discharge. No scleral icterus.  Neck: Neck supple. No thyromegaly present.  Cardiovascular: Normal rate and regular rhythm.   Pulmonary/Chest: Breath sounds normal. No accessory muscle usage. No tachypnea. No respiratory distress. She has no decreased breath sounds. She has no wheezes. She has no rhonchi. Right breast exhibits no inverted nipple, no mass, no nipple discharge and no tenderness (no axillary adenopathy). Left breast exhibits no inverted nipple, no mass, no nipple discharge and no tenderness (no axilarry adenopathy).  Abdominal: Soft. Bowel sounds are normal. There is no tenderness.  Musculoskeletal: She exhibits no edema or tenderness.  Lymphadenopathy:    She has no cervical adenopathy.  Neurological: She is alert and oriented to person, place, and time.  Skin: Skin is warm. No rash noted. No erythema.  Psychiatric: She has a normal mood and affect. Her behavior is normal.    BP 110/70 mmHg  Pulse 82  Temp(Src) 98.2 F (36.8 C) (Oral)  Resp 18  Ht 5' 4.5" (1.638 m)  Wt 152 lb 4 oz (69.06 kg)  BMI 25.74 kg/m2  SpO2 96% Wt Readings from Last 3 Encounters:  01/28/15 152 lb 4 oz (69.06 kg)  01/04/15 152 lb 8 oz (69.174 kg)  12/11/14 152 lb (68.947 kg)     Lab Results  Component Value Date   WBC 4.4 12/11/2014   HGB 14.5 12/11/2014   HCT 41.8 12/11/2014   PLT 191 12/11/2014   GLUCOSE 95 09/11/2014   CHOL 159 09/11/2014   TRIG 176.0* 09/11/2014   HDL 44.40 09/11/2014   LDLCALC 79 09/11/2014   ALT 75* 12/11/2014   AST 33 12/11/2014   NA 143 09/11/2014   K 4.0 09/11/2014   CL 106 09/11/2014   CREATININE 1.00 01/04/2015   BUN 19  09/11/2014   CO2 30 09/11/2014   TSH 0.44 10/25/2014   HGBA1C 5.2 08/30/2013    Mm Diag Breast Tomo Bilateral  10/04/2014   CLINICAL DATA:  Patient with history of right breast lumpectomy 2009.  EXAM: DIGITAL DIAGNOSTIC BILATERAL MAMMOGRAM WITH 3D TOMOSYNTHESIS AND CAD  COMPARISON:  [facility]  ACR Breast Density Category c: The breast tissue is heterogeneously dense, which may obscure small masses.  FINDINGS: Stable postlumpectomy changes right breast. No concerning masses, calcifications or nonsurgical architectural distortion identified within either breast.  Mammographic images were processed with CAD.  IMPRESSION: Stable postlumpectomy changes right breast. Stable right  breast fibroglandular pattern.  RECOMMENDATION: Screening mammogram in one year.(Code:SM-B-01Y)  I have discussed the findings and recommendations with the patient. Results were also provided in writing at the conclusion of the visit. If applicable, a reminder letter will be sent to the patient regarding the next appointment.  BI-RADS CATEGORY  2: Benign.   Electronically Signed   By: Lovey Newcomer M.D.   On: 10/04/2014 12:10       Assessment & Plan:   Problem List Items Addressed This Visit    Abnormal liver function test - Primary    Previous abdominal ultrasound revealed fatty liver.  Follow liver panel.  Diet, exercise and weight loss.        Relevant Orders   Hepatic function panel   Anxiety    Followed by Dr Clovis Riley.        CARCINOMA, THYROID GLAND, PAPILLARY    Recent quantitative thyroglobulin and antithyroglobulin ab negative.  No documented history of metastatic thyroid cancer.  Follow tsh.        Relevant Orders   TSH   Environmental allergies    Did report some allergy symptoms.  Dymista.  Saline nasal spray as directed.  Follow.       GERD (gastroesophageal reflux disease)    Controlled on protonix.        Health care maintenance    Physical today 01/28/15.  PAP 05/30/12 - negative.  Mammogram 10/04/14 -  Birads II.  Colonoscopy 08/02/14 - 6 polyps and internal hemorrhoids.        History of malignant neoplasm of large intestine    Colonoscopy 08/02/14 - as outlined in overview.  Recommended f/u colonoscopy 07/2017.        Relevant Orders   Basic metabolic panel   Hypercholesterolemia    Low cholesterol diet and exercise.  Off simvastatin.  Follow lipid panel.        Relevant Orders   Lipid panel   Malignant neoplasm of female breast    Mammogram 10/04/14 - Birads II.        Mild depression    Discussed with her today.  Discussed treatment options.  Followed by psychiatry.  Continue current medication regimen.        Osteoporosis    On aromasin.  See previous note.  Reclast.           [name], MD

## 2015-02-02 ENCOUNTER — Encounter: Payer: Self-pay | Admitting: Internal Medicine

## 2015-02-02 DIAGNOSIS — F320 Major depressive disorder, single episode, mild: Secondary | ICD-10-CM | POA: Insufficient documentation

## 2015-02-02 DIAGNOSIS — F32A Depression, unspecified: Secondary | ICD-10-CM | POA: Insufficient documentation

## 2015-02-02 NOTE — Assessment & Plan Note (Signed)
Colonoscopy 08/02/14 - as outlined in overview.  Recommended f/u colonoscopy 07/2017.

## 2015-02-02 NOTE — Assessment & Plan Note (Signed)
On aromasin.  See previous note.  Reclast.

## 2015-02-02 NOTE — Assessment & Plan Note (Signed)
Previous abdominal ultrasound revealed fatty liver.  Follow liver panel.  Diet, exercise and weight loss.

## 2015-02-02 NOTE — Assessment & Plan Note (Signed)
Mammogram 10/04/14 - [facility] II.

## 2015-02-02 NOTE — Assessment & Plan Note (Signed)
Followed by Dr Clovis Riley.

## 2015-02-02 NOTE — Assessment & Plan Note (Signed)
Low cholesterol diet and exercise.  Off simvastatin.  Follow lipid panel.

## 2015-02-02 NOTE — Assessment & Plan Note (Signed)
Physical today 01/28/15.  PAP 05/30/12 - negative.  Mammogram 10/04/14 - Birads II.  Colonoscopy 08/02/14 - 6 polyps and internal hemorrhoids.

## 2015-02-02 NOTE — Assessment & Plan Note (Signed)
Recent quantitative thyroglobulin and antithyroglobulin ab negative.  No documented history of metastatic thyroid cancer.  Follow tsh.

## 2015-02-02 NOTE — Assessment & Plan Note (Signed)
Controlled on protonix.   

## 2015-02-02 NOTE — Assessment & Plan Note (Signed)
Discussed with her today.  Discussed treatment options.  Followed by psychiatry.  Continue current medication regimen.

## 2015-02-02 NOTE — Assessment & Plan Note (Signed)
Did report some allergy symptoms.  Dymista.  Saline nasal spray as directed.  Follow.

## 2015-02-20 ENCOUNTER — Other Ambulatory Visit: Payer: Self-pay | Admitting: Nurse Practitioner

## 2015-02-20 DIAGNOSIS — R748 Abnormal levels of other serum enzymes: Secondary | ICD-10-CM

## 2015-02-20 DIAGNOSIS — K7689 Other specified diseases of liver: Secondary | ICD-10-CM

## 2015-02-20 DIAGNOSIS — K760 Fatty (change of) liver, not elsewhere classified: Secondary | ICD-10-CM

## 2015-02-22 ENCOUNTER — Other Ambulatory Visit
Admission: RE | Admit: 2015-02-22 | Discharge: 2015-02-22 | Disposition: A | Discharge status: Home / Self Care | Payer: PPO | Source: Other Acute Inpatient Hospital | Attending: Nurse Practitioner | Admitting: Nurse Practitioner

## 2015-02-22 DIAGNOSIS — K760 Fatty (change of) liver, not elsewhere classified: Secondary | ICD-10-CM | POA: Insufficient documentation

## 2015-02-22 LAB — C DIFFICILE QUICK SCREEN W PCR REFLEX
C DIFFICILE (CDIFF) TOXIN: NEGATIVE
C DIFFICLE (CDIFF) ANTIGEN: NEGATIVE
C Diff interpretation: NEGATIVE

## 2015-02-24 LAB — STOOL CULTURE

## 2015-02-26 ENCOUNTER — Ambulatory Visit
Admission: RE | Admit: 2015-02-26 | Discharge: 2015-02-26 | Disposition: A | Discharge status: Home / Self Care | Payer: PPO | Source: Ambulatory Visit | Attending: Nurse Practitioner | Admitting: Nurse Practitioner

## 2015-02-26 DIAGNOSIS — R748 Abnormal levels of other serum enzymes: Secondary | ICD-10-CM

## 2015-02-26 DIAGNOSIS — K7689 Other specified diseases of liver: Secondary | ICD-10-CM

## 2015-02-26 DIAGNOSIS — K760 Fatty (change of) liver, not elsewhere classified: Secondary | ICD-10-CM | POA: Diagnosis present

## 2015-02-27 LAB — O&P RESULT

## 2015-02-27 LAB — GIARDIA, EIA; OVA/PARASITE: GIARDIA AG STL: NEGATIVE

## 2015-03-06 ENCOUNTER — Telehealth: Payer: Self-pay | Admitting: Internal Medicine

## 2015-03-06 NOTE — Telephone Encounter (Signed)
Pt was informed to come in for labs still and she understood.

## 2015-03-06 NOTE — Telephone Encounter (Signed)
If she can provide [place] with the labs that are being drawn and then I can see what labs overlap.  Thanks

## 2015-03-06 NOTE — Telephone Encounter (Signed)
Pt wanted to know if she will need to have lab work done on the 15th because Dr. Lennox Grumbles is having pt labs drawn on 11/8. Please advise pt/msn

## 2015-03-11 ENCOUNTER — Other Ambulatory Visit: Payer: Self-pay | Admitting: Nurse Practitioner

## 2015-03-11 DIAGNOSIS — R748 Abnormal levels of other serum enzymes: Secondary | ICD-10-CM

## 2015-03-11 DIAGNOSIS — K760 Fatty (change of) liver, not elsewhere classified: Secondary | ICD-10-CM

## 2015-03-15 ENCOUNTER — Ambulatory Visit
Admission: RE | Admit: 2015-03-15 | Discharge: 2015-03-15 | Disposition: A | Discharge status: Home / Self Care | Payer: PPO | Source: Ambulatory Visit | Attending: Nurse Practitioner | Admitting: Nurse Practitioner

## 2015-03-15 DIAGNOSIS — R748 Abnormal levels of other serum enzymes: Secondary | ICD-10-CM | POA: Diagnosis not present

## 2015-03-15 DIAGNOSIS — K760 Fatty (change of) liver, not elsewhere classified: Secondary | ICD-10-CM

## 2015-03-19 ENCOUNTER — Other Ambulatory Visit (INDEPENDENT_AMBULATORY_CARE_PROVIDER_SITE_OTHER): Payer: PPO

## 2015-03-19 DIAGNOSIS — E7800 Pure hypercholesterolemia, unspecified: Secondary | ICD-10-CM

## 2015-03-19 DIAGNOSIS — R7989 Other specified abnormal findings of blood chemistry: Secondary | ICD-10-CM | POA: Diagnosis not present

## 2015-03-19 DIAGNOSIS — Z85038 Personal history of other malignant neoplasm of large intestine: Secondary | ICD-10-CM | POA: Diagnosis not present

## 2015-03-19 DIAGNOSIS — C73 Malignant neoplasm of thyroid gland: Secondary | ICD-10-CM | POA: Diagnosis not present

## 2015-03-19 DIAGNOSIS — R945 Abnormal results of liver function studies: Secondary | ICD-10-CM

## 2015-03-19 LAB — LIPID PANEL
CHOL/HDL RATIO: 5
Cholesterol: 209 mg/dL — ABNORMAL HIGH (ref 0–200)
HDL: 44.7 mg/dL (ref >39.00)
LDL CALC: 125 mg/dL — AB (ref 0–99)
NonHDL: 164.19
TRIGLYCERIDES: 198 mg/dL — AB (ref 0.0–149.0)
VLDL: 39.6 mg/dL (ref 0.0–40.0)

## 2015-03-19 LAB — BASIC METABOLIC PANEL
BUN: 17 mg/dL (ref 6–23)
CALCIUM: 9.1 mg/dL (ref 8.4–10.5)
CO2: 29 mEq/L (ref 19–32)
Chloride: 106 mEq/L (ref 96–112)
Creatinine, Ser: 0.99 mg/dL (ref 0.40–1.20)
GFR: 59.77 mL/min — AB (ref >60.00)
GLUCOSE: 96 mg/dL (ref 70–99)
POTASSIUM: 3.3 meq/L — AB (ref 3.5–5.1)
SODIUM: 144 meq/L (ref 135–145)

## 2015-03-19 LAB — HEPATIC FUNCTION PANEL
ALBUMIN: 4.1 g/dL (ref 3.5–5.2)
ALT: 82 U/L — AB (ref 0–35)
AST: 30 U/L (ref 0–37)
Alkaline Phosphatase: 91 U/L (ref 39–117)
BILIRUBIN DIRECT: 0.1 mg/dL (ref 0.0–0.3)
TOTAL PROTEIN: 6.9 g/dL (ref 6.0–8.3)
Total Bilirubin: 0.7 mg/dL (ref 0.2–1.2)

## 2015-03-19 LAB — TSH: TSH: 1.04 u[IU]/mL (ref 0.35–4.50)

## 2015-03-20 ENCOUNTER — Other Ambulatory Visit: Payer: Self-pay | Admitting: Internal Medicine

## 2015-03-20 ENCOUNTER — Encounter: Payer: Self-pay | Admitting: *Deleted

## 2015-03-20 DIAGNOSIS — R7989 Other specified abnormal findings of blood chemistry: Secondary | ICD-10-CM

## 2015-03-20 DIAGNOSIS — E876 Hypokalemia: Secondary | ICD-10-CM

## 2015-03-20 DIAGNOSIS — R945 Abnormal results of liver function studies: Secondary | ICD-10-CM

## 2015-03-20 NOTE — Progress Notes (Signed)
Order placed for f/u labs.  

## 2015-03-29 ENCOUNTER — Ambulatory Visit: Payer: PPO | Admitting: Internal Medicine

## 2015-04-02 ENCOUNTER — Other Ambulatory Visit (INDEPENDENT_AMBULATORY_CARE_PROVIDER_SITE_OTHER): Payer: PPO

## 2015-04-02 DIAGNOSIS — R7989 Other specified abnormal findings of blood chemistry: Secondary | ICD-10-CM | POA: Diagnosis not present

## 2015-04-02 DIAGNOSIS — E876 Hypokalemia: Secondary | ICD-10-CM

## 2015-04-02 DIAGNOSIS — R945 Abnormal results of liver function studies: Secondary | ICD-10-CM

## 2015-04-02 LAB — HEPATIC FUNCTION PANEL
ALBUMIN: 4.1 g/dL (ref 3.5–5.2)
ALT: 71 U/L — AB (ref 0–35)
AST: 24 U/L (ref 0–37)
Alkaline Phosphatase: 97 U/L (ref 39–117)
Bilirubin, Direct: 0.1 mg/dL (ref 0.0–0.3)
TOTAL PROTEIN: 6.6 g/dL (ref 6.0–8.3)
Total Bilirubin: 0.8 mg/dL (ref 0.2–1.2)

## 2015-04-02 LAB — POTASSIUM: Potassium: 3.3 mEq/L — ABNORMAL LOW (ref 3.5–5.1)

## 2015-04-03 ENCOUNTER — Encounter: Payer: Self-pay | Admitting: *Deleted

## 2015-04-11 ENCOUNTER — Ambulatory Visit (INDEPENDENT_AMBULATORY_CARE_PROVIDER_SITE_OTHER): Payer: PPO | Admitting: Internal Medicine

## 2015-04-11 ENCOUNTER — Encounter: Payer: Self-pay | Admitting: Internal Medicine

## 2015-04-11 VITALS — BP 110/70 | HR 81 | Temp 98.1°F | Ht 64.5 in | Wt 152.0 lb

## 2015-04-11 DIAGNOSIS — R945 Abnormal results of liver function studies: Secondary | ICD-10-CM

## 2015-04-11 DIAGNOSIS — F329 Major depressive disorder, single episode, unspecified: Secondary | ICD-10-CM

## 2015-04-11 DIAGNOSIS — Z9109 Other allergy status, other than to drugs and biological substances: Secondary | ICD-10-CM

## 2015-04-11 DIAGNOSIS — Z658 Other specified problems related to psychosocial circumstances: Secondary | ICD-10-CM

## 2015-04-11 DIAGNOSIS — C73 Malignant neoplasm of thyroid gland: Secondary | ICD-10-CM | POA: Diagnosis not present

## 2015-04-11 DIAGNOSIS — F419 Anxiety disorder, unspecified: Secondary | ICD-10-CM

## 2015-04-11 DIAGNOSIS — K219 Gastro-esophageal reflux disease without esophagitis: Secondary | ICD-10-CM

## 2015-04-11 DIAGNOSIS — F32A Depression, unspecified: Secondary | ICD-10-CM

## 2015-04-11 DIAGNOSIS — Z85038 Personal history of other malignant neoplasm of large intestine: Secondary | ICD-10-CM

## 2015-04-11 DIAGNOSIS — R7989 Other specified abnormal findings of blood chemistry: Secondary | ICD-10-CM

## 2015-04-11 DIAGNOSIS — C50919 Malignant neoplasm of unspecified site of unspecified female breast: Secondary | ICD-10-CM

## 2015-04-11 DIAGNOSIS — F320 Major depressive disorder, single episode, mild: Secondary | ICD-10-CM

## 2015-04-11 DIAGNOSIS — Z91048 Other nonmedicinal substance allergy status: Secondary | ICD-10-CM

## 2015-04-11 DIAGNOSIS — R195 Other fecal abnormalities: Secondary | ICD-10-CM

## 2015-04-11 DIAGNOSIS — M810 Age-related osteoporosis without current pathological fracture: Secondary | ICD-10-CM | POA: Diagnosis not present

## 2015-04-11 DIAGNOSIS — F439 Reaction to severe stress, unspecified: Secondary | ICD-10-CM

## 2015-04-11 DIAGNOSIS — E7800 Pure hypercholesterolemia, unspecified: Secondary | ICD-10-CM

## 2015-04-11 NOTE — Progress Notes (Signed)
Pre-visit discussion using our clinic review tool. No additional management support is needed unless otherwise documented below in the visit note.  

## 2015-04-11 NOTE — Progress Notes (Signed)
Patient ID: Debra Hale, female   DOB: 1951/04/25, 64 y.o.   MRN: [id number]   Subjective:    Patient ID: Debra Hale, female    DOB: 1951-04-15, 64 y.o.   MRN: [id number]  HPI  Patient with past history of anxiety, GERD, breast cancer, thyroid cancer and colon cancer.  She comes in today to follow up on these issues.  She is still having the loose stools.  Saw GI.  See their note for details.  W/up unrevealing.  She is eating and drinking.  No chest pain or tightness.  No sob.  No nausea or vomiting.  Still with increased stress.  Feels she is handling relatively well.  Does not feel needs anything more at this time.     Past Medical History  Diagnosis Date  . Palpitations   . Other and unspecified hyperlipidemia   . Unspecified hereditary and idiopathic peripheral neuropathy   . Personal history of malignant neoplasm of large intestine     carcinoma - cecum, s/p right laparoscopic colectomy - s/p chemotherapy and XRT  . Anxiety   . Liver nodule     s/p negative biopsy  . Nephrolithiasis     s/p lithotripsy  . GERD (gastroesophageal reflux disease)   . Osteoporosis   . Malignant neoplasm of breast (female), unspecified site     s/p lumpectomy  . Malignant neoplasm of thyroid gland [facility])     s/p surgery and XRT   Past Surgical History  Procedure Laterality Date  . Appendectomy  1985  . Cholecystectomy  1995  . Thyroid lobectomy  2002    s/p XRT  . Laparoscopic partial colectomy      stage 3-C carcinoma of the cecum, s/p chemotherapy and xrt  . Lithotripsy    . Dilation and curettage of uterus  1990  . Breast lumpectomy  2009    breast cancer   Family History  Problem Relation Age of Onset  . Stroke Mother     64s  . Lung cancer Father   . Prostate cancer Father   . Breast cancer Sister   . Lung cancer Sister   . Breast cancer      aunt   Social History   Social History  . Marital Status: Divorced    Spouse Name: N/A  . Number of Children: 0  . Years of  Education: N/A   Social History Main Topics  . Smoking status: Never Smoker   . Smokeless tobacco: Never Used  . Alcohol Use: No  . Drug Use: No  . Sexual Activity: Not Asked   Other Topics Concern  . None   Social History Narrative    Outpatient Encounter Prescriptions as of 04/11/2015  Medication Sig  . Azelastine-Fluticasone 137-50 MCG/ACT SUSP One spray each nostril once a day  . bismuth subsalicylate (PEPTO-BISMOL) 262 MG/15ML suspension Take 15 mLs by mouth every 6 (six) hours as needed.  . clonazePAM (KLONOPIN) 1 MG tablet Take 1 mg by mouth 2 (two) times daily.   Marland Kitchen escitalopram (LEXAPRO) 20 MG tablet Take 20 mg by mouth daily.    Marland Kitchen levothyroxine (SYNTHROID, LEVOTHROID) 88 MCG tablet TAKE 1 TABLET (88 MCG TOTAL) BY MOUTH DAILY BEFORE BREAKFAST.  . pantoprazole (PROTONIX) 40 MG tablet TAKE 1 TABLET BY MOUTH  DAILY.  . [DISCONTINUED] simvastatin (ZOCOR) 10 MG tablet Take 10 mg by mouth at bedtime.   No facility-administered encounter medications on file as of 04/11/2015.    Review of Systems  Constitutional: Negative for appetite change and unexpected weight change.  HENT: Negative for congestion and sinus pressure.   Respiratory: Negative for cough, chest tightness and shortness of breath.   Cardiovascular: Negative for chest pain, palpitations and leg swelling.  Gastrointestinal: Negative for nausea, vomiting, abdominal pain and diarrhea.  Genitourinary: Negative for dysuria and difficulty urinating.  Musculoskeletal: Negative for back pain and joint swelling.  Skin: Negative for color change and rash.  Neurological: Negative for dizziness, light-headedness and headaches.  Psychiatric/Behavioral: Negative for dysphoric mood and agitation.       Increased stress as outlined.         Objective:    Physical Exam  Constitutional: She appears well-developed and well-nourished. No distress.  HENT:  Nose: Nose normal.  Mouth/Throat: Oropharynx is clear and moist.    Eyes: Conjunctivae are normal. Right eye exhibits no discharge. Left eye exhibits no discharge.  Neck: Neck supple. No thyromegaly present.  Cardiovascular: Normal rate and regular rhythm.   Pulmonary/Chest: Breath sounds normal. No respiratory distress. She has no wheezes.  Abdominal: Soft. Bowel sounds are normal. There is no tenderness.  Musculoskeletal: She exhibits no edema or tenderness.  Lymphadenopathy:    She has no cervical adenopathy.  Skin: No rash noted. No erythema.  Psychiatric: She has a normal mood and affect. Her behavior is normal.    BP 110/70 mmHg  Pulse 81  Temp(Src) 98.1 F (36.7 C) (Oral)  Ht 5' 4.5" (1.638 m)  Wt 152 lb (68.947 kg)  BMI 25.70 kg/m2  SpO2 97% Wt Readings from Last 3 Encounters:  04/11/15 152 lb (68.947 kg)  01/28/15 152 lb 4 oz (69.06 kg)  01/04/15 152 lb 8 oz (69.174 kg)     Lab Results  Component Value Date   WBC 4.4 12/11/2014   HGB 14.5 12/11/2014   HCT 41.8 12/11/2014   PLT 191 12/11/2014   GLUCOSE 96 03/19/2015   CHOL 209* 03/19/2015   TRIG 198.0* 03/19/2015   HDL 44.70 03/19/2015   LDLCALC 125* 03/19/2015   ALT 71* 04/02/2015   AST 24 04/02/2015   NA 144 03/19/2015   K 3.3* 04/02/2015   CL 106 03/19/2015   CREATININE 0.99 03/19/2015   BUN 17 03/19/2015   CO2 29 03/19/2015   TSH 1.04 03/19/2015   HGBA1C 5.2 08/30/2013    US Liver Elastography W/o Img  03/15/2015  CLINICAL DATA:  Hepatic steatosis, elevated liver enzymes. EXAM: ULTRASOUND HEPATIC ELASTOGRAPHY TECHNIQUE: Ultrasound elastography evaluation of the liver was performed. A region of interest was placed in the right lobe of the liver. Following application of a compressive sonographic pulse, shear waves were detected in the adjacent hepatic tissue and the shear wave velocity was calculated. Multiple assessments were performed at the selected site. Median shear wave velocity is correlated to a Metavir fibrosis score. COMPARISON:  02/26/2015 abdominal  sonogram. FINDINGS: Device: Siemens Helix VTQ Transducer 6C1 Patient position: Supine Number of measurements: 10 Hepatic segment:  8 Median velocity:   1.02  m/sec IQR: 0.25 IQR/Median velocity ratio: 0.24 Corresponding Metavir fibrosis score:  F0 / F1 Risk of fibrosis: Minimal Limitations of exam: None Pertinent findings noted on other imaging exams:  None Please note that abnormal shear wave velocities may also be identified in clinical settings other than with hepatic fibrosis, such as: acute hepatitis, elevated right heart and central venous pressures including use of beta blockers, veno-occlusive disease (Budd-Chiari), infiltrative processes such as mastocytosis/amyloidosis/infiltrative tumor, extrahepatic cholestasis, in the post-prandial state, and liver transplantation.  Correlation with patient history, laboratory data, and clinical condition recommended. IMPRESSION: Median hepatic shear wave velocity is calculated at 1.02 m/sec. Corresponding Metavir fibrosis score is  F0 / F1. Risk of fibrosis is minimal. Follow-up: None required. Electronically Signed   By: Ilona Sorrel M.D.   On: 03/15/2015 08:36       Assessment & Plan:   Problem List Items Addressed This Visit    Abnormal liver function test    Worked up and evaluated by GI (recently).  See their note for details.  Follow.        Anxiety    Followed by Dr Clovis Riley.  On lexapro.        CARCINOMA, THYROID GLAND, PAPILLARY    Recent quantitative thyroglobulin and antithyroglobulin ab - negative.  No documented h/o metastatic thyroid cancer.  Follow tsh.        Relevant Orders   TSH   Environmental allergies    Saline nasal spray and astelin/fluticasone as directed.  Follow.        GERD (gastroesophageal reflux disease) - Primary    Symptoms controlled on protonix.  Follow.        History of malignant neoplasm of large intestine    Colonoscopy 08/02/14 as outlined in overview.  Recommended f/u colonoscopy in 07/2017.         Relevant Orders   Basic metabolic panel   Hypercholesterolemia    Low cholesterol diet and exercise.  Follow lipid panel.       Relevant Orders   Hepatic function panel   Lipid panel   Loose stools    Persistent problems with loose stool.  Saw GI.  W/up unrevealing.  F/u with GI regarding further recommendations.        Malignant neoplasm of female breast ([place])    Mammogram 10/04/14 - birads II.        Mild depression    Followed by Dr Clovis Riley.  Continue current medication regimen.        Osteoporosis    On aromasin.  See prior note.  Reclast.        Relevant Orders   VITAMIN D 25 Hydroxy (Vit-D Deficiency, Fractures)   Stress    Sees Dr Clovis Riley.  Stable.            [name], MD

## 2015-04-14 ENCOUNTER — Encounter: Payer: Self-pay | Admitting: Internal Medicine

## 2015-04-14 DIAGNOSIS — R195 Other fecal abnormalities: Secondary | ICD-10-CM | POA: Insufficient documentation

## 2015-04-14 NOTE — Assessment & Plan Note (Signed)
Colonoscopy 08/02/14 as outlined in overview.  Recommended f/u colonoscopy in 07/2017.

## 2015-04-14 NOTE — Assessment & Plan Note (Signed)
Mammogram 10/04/14 - birads II.

## 2015-04-14 NOTE — Assessment & Plan Note (Signed)
Followed by Dr Clovis Riley.  Continue current medication regimen.

## 2015-04-14 NOTE — Assessment & Plan Note (Signed)
Symptoms controlled on protonix.  Follow.   

## 2015-04-14 NOTE — Assessment & Plan Note (Signed)
Low cholesterol diet and exercise.  Follow lipid panel.   

## 2015-04-14 NOTE — Assessment & Plan Note (Signed)
Saline nasal spray and astelin/fluticasone as directed.  Follow.

## 2015-04-14 NOTE — Assessment & Plan Note (Signed)
Recent quantitative thyroglobulin and antithyroglobulin ab - negative.  No documented h/o metastatic thyroid cancer.  Follow tsh.

## 2015-04-14 NOTE — Assessment & Plan Note (Signed)
On aromasin.  See prior note.  Reclast.

## 2015-04-14 NOTE — Assessment & Plan Note (Signed)
Worked up and evaluated by GI (recently).  See their note for details.  Follow.

## 2015-04-14 NOTE — Assessment & Plan Note (Signed)
Persistent problems with loose stool.  Saw GI.  W/up unrevealing.  F/u with GI regarding further recommendations.

## 2015-04-14 NOTE — Assessment & Plan Note (Signed)
Sees Dr Clovis Riley.  Stable.

## 2015-04-14 NOTE — Assessment & Plan Note (Signed)
Followed by Dr Levine.  On lexapro.  

## 2015-04-15 ENCOUNTER — Other Ambulatory Visit: Payer: Self-pay

## 2015-04-15 MED ORDER — LEVOTHYROXINE SODIUM 88 MCG PO TABS: ORAL_TABLET | ORAL | Status: DC

## 2015-04-15 MED ORDER — PANTOPRAZOLE SODIUM 40 MG PO TBEC: DELAYED_RELEASE_TABLET | ORAL | Status: DC

## 2015-04-26 ENCOUNTER — Other Ambulatory Visit: Payer: Self-pay | Admitting: Internal Medicine

## 2015-04-26 ENCOUNTER — Telehealth: Payer: Self-pay | Admitting: Internal Medicine

## 2015-04-26 ENCOUNTER — Other Ambulatory Visit: Payer: Self-pay | Admitting: *Deleted

## 2015-04-26 MED ORDER — AZELASTINE-FLUTICASONE 137-50 MCG/ACT NA SUSP: NASAL | Status: DC

## 2015-04-26 NOTE — Telephone Encounter (Signed)
[facility] would like to change dosage on pt's nose spray. Pleas call [phone]   Thank you

## 2015-04-26 NOTE — Telephone Encounter (Signed)
See note

## 2015-04-30 NOTE — Telephone Encounter (Signed)
What would they like to change to?  Need more info.

## 2015-04-30 NOTE — Telephone Encounter (Addendum)
Patient dosage was changed on Friday to Azelastine-Fluticasone 137-50 MCG/ACT SUSP 3 ordered Reorder  Summary: Two spray each nostril once a day, Normal    To cover amount prescribed of 29 g so patient not have to pay a double co-pay. There was no note in Epic as to whom changed dosage.

## 2015-05-21 DIAGNOSIS — K580 Irritable bowel syndrome with diarrhea: Secondary | ICD-10-CM | POA: Diagnosis not present

## 2015-05-30 DIAGNOSIS — J019 Acute sinusitis, unspecified: Secondary | ICD-10-CM | POA: Diagnosis not present

## 2015-05-30 DIAGNOSIS — B9689 Other specified bacterial agents as the cause of diseases classified elsewhere: Secondary | ICD-10-CM | POA: Diagnosis not present

## 2015-05-30 DIAGNOSIS — R05 Cough: Secondary | ICD-10-CM | POA: Diagnosis not present

## 2015-06-08 DIAGNOSIS — J019 Acute sinusitis, unspecified: Secondary | ICD-10-CM | POA: Diagnosis not present

## 2015-06-08 DIAGNOSIS — B9689 Other specified bacterial agents as the cause of diseases classified elsewhere: Secondary | ICD-10-CM | POA: Diagnosis not present

## 2015-07-11 ENCOUNTER — Other Ambulatory Visit (INDEPENDENT_AMBULATORY_CARE_PROVIDER_SITE_OTHER): Payer: PPO

## 2015-07-11 DIAGNOSIS — E7800 Pure hypercholesterolemia, unspecified: Secondary | ICD-10-CM | POA: Diagnosis not present

## 2015-07-11 DIAGNOSIS — C73 Malignant neoplasm of thyroid gland: Secondary | ICD-10-CM | POA: Diagnosis not present

## 2015-07-11 DIAGNOSIS — M810 Age-related osteoporosis without current pathological fracture: Secondary | ICD-10-CM | POA: Diagnosis not present

## 2015-07-11 DIAGNOSIS — Z85038 Personal history of other malignant neoplasm of large intestine: Secondary | ICD-10-CM | POA: Diagnosis not present

## 2015-07-11 LAB — HEPATIC FUNCTION PANEL
ALBUMIN: 4.1 g/dL (ref 3.5–5.2)
ALK PHOS: 90 U/L (ref 39–117)
ALT: 62 U/L — AB (ref 0–35)
AST: 22 U/L (ref 0–37)
Bilirubin, Direct: 0.1 mg/dL (ref 0.0–0.3)
TOTAL PROTEIN: 6.5 g/dL (ref 6.0–8.3)
Total Bilirubin: 0.7 mg/dL (ref 0.2–1.2)

## 2015-07-11 LAB — TSH: TSH: 1.33 u[IU]/mL (ref 0.35–4.50)

## 2015-07-11 LAB — BASIC METABOLIC PANEL
BUN: 16 mg/dL (ref 6–23)
CALCIUM: 9.2 mg/dL (ref 8.4–10.5)
CO2: 31 meq/L (ref 19–32)
Chloride: 104 mEq/L (ref 96–112)
Creatinine, Ser: 0.96 mg/dL (ref 0.40–1.20)
GFR: 61.87 mL/min (ref >60.00)
Glucose, Bld: 90 mg/dL (ref 70–99)
POTASSIUM: 3.4 meq/L — AB (ref 3.5–5.1)
SODIUM: 145 meq/L (ref 135–145)

## 2015-07-11 LAB — VITAMIN D 25 HYDROXY (VIT D DEFICIENCY, FRACTURES): VITD: 7.24 ng/mL — ABNORMAL LOW (ref 30.00–100.00)

## 2015-07-11 LAB — LIPID PANEL
Cholesterol: 211 mg/dL — ABNORMAL HIGH (ref 0–200)
HDL: 45.7 mg/dL (ref >39.00)
NonHDL: 165.26
Total CHOL/HDL Ratio: 5
Triglycerides: 205 mg/dL — ABNORMAL HIGH (ref 0.0–149.0)
VLDL: 41 mg/dL — AB (ref 0.0–40.0)

## 2015-07-11 LAB — LDL CHOLESTEROL, DIRECT: LDL DIRECT: 126 mg/dL

## 2015-07-16 ENCOUNTER — Ambulatory Visit (INDEPENDENT_AMBULATORY_CARE_PROVIDER_SITE_OTHER): Payer: PPO | Admitting: Internal Medicine

## 2015-07-16 ENCOUNTER — Encounter: Payer: Self-pay | Admitting: Internal Medicine

## 2015-07-16 ENCOUNTER — Telehealth: Payer: Self-pay

## 2015-07-16 VITALS — BP 140/80 | HR 91 | Temp 98.1°F | Resp 18 | Ht 64.5 in | Wt 149.8 lb

## 2015-07-16 DIAGNOSIS — F32A Depression, unspecified: Secondary | ICD-10-CM

## 2015-07-16 DIAGNOSIS — F329 Major depressive disorder, single episode, unspecified: Secondary | ICD-10-CM | POA: Diagnosis not present

## 2015-07-16 DIAGNOSIS — K219 Gastro-esophageal reflux disease without esophagitis: Secondary | ICD-10-CM | POA: Diagnosis not present

## 2015-07-16 DIAGNOSIS — R143 Flatulence: Secondary | ICD-10-CM

## 2015-07-16 DIAGNOSIS — IMO0001 Reserved for inherently not codable concepts without codable children: Secondary | ICD-10-CM

## 2015-07-16 DIAGNOSIS — Z85038 Personal history of other malignant neoplasm of large intestine: Secondary | ICD-10-CM

## 2015-07-16 DIAGNOSIS — R945 Abnormal results of liver function studies: Secondary | ICD-10-CM

## 2015-07-16 DIAGNOSIS — F320 Major depressive disorder, single episode, mild: Secondary | ICD-10-CM

## 2015-07-16 DIAGNOSIS — Z658 Other specified problems related to psychosocial circumstances: Secondary | ICD-10-CM | POA: Diagnosis not present

## 2015-07-16 DIAGNOSIS — C50919 Malignant neoplasm of unspecified site of unspecified female breast: Secondary | ICD-10-CM

## 2015-07-16 DIAGNOSIS — C73 Malignant neoplasm of thyroid gland: Secondary | ICD-10-CM | POA: Diagnosis not present

## 2015-07-16 DIAGNOSIS — F439 Reaction to severe stress, unspecified: Secondary | ICD-10-CM

## 2015-07-16 DIAGNOSIS — E559 Vitamin D deficiency, unspecified: Secondary | ICD-10-CM

## 2015-07-16 DIAGNOSIS — R7989 Other specified abnormal findings of blood chemistry: Secondary | ICD-10-CM

## 2015-07-16 DIAGNOSIS — E7800 Pure hypercholesterolemia, unspecified: Secondary | ICD-10-CM

## 2015-07-16 MED ORDER — ERGOCALCIFEROL 1.25 MG (50000 UT) PO CAPS: 50000.0000 [IU] | ORAL_CAPSULE | ORAL | Status: DC

## 2015-07-16 NOTE — Telephone Encounter (Signed)
Sent in rx for ergocalciferol 50,000 units to be taken once a week.  (prescription for vitamin D)

## 2015-07-16 NOTE — Progress Notes (Signed)
Patient ID: Debra Hale, female   DOB: 09-01-1950, 65 y.o.   MRN: [id number]   Subjective:    Patient ID: Debra Hale, female    DOB: [birth year], 65 y.o.   MRN: [id number]  HPI  Patient here for a scheduled follow up.  Has a history of colon cancer, breast cancer and thyroid cancer.  She has been under increased stress with her current relationship.  Discussed with her today.  She sees psychiatry.  Discussed follow up with psychiatry.  Tries to stay active.  No cardiac symptoms with increased activity or exertion.  No sob.  Some bloating and increased gas.  Some diarrhea this am.     Past Medical History  Diagnosis Date  . Palpitations   . Other and unspecified hyperlipidemia   . Unspecified hereditary and idiopathic peripheral neuropathy   . Personal history of malignant neoplasm of large intestine     carcinoma - cecum, s/p right laparoscopic colectomy - s/p chemotherapy and XRT  . Anxiety   . Liver nodule     s/p negative biopsy  . Nephrolithiasis     s/p lithotripsy  . GERD (gastroesophageal reflux disease)   . Osteoporosis   . Malignant neoplasm of breast (female), unspecified site     s/p lumpectomy  . Malignant neoplasm of thyroid gland [facility])     s/p surgery and XRT   Past Surgical History  Procedure Laterality Date  . Appendectomy  1985  . Cholecystectomy  1995  . Thyroid lobectomy  2002    s/p XRT  . Laparoscopic partial colectomy      stage 3-C carcinoma of the cecum, s/p chemotherapy and xrt  . Lithotripsy    . Dilation and curettage of uterus  1990  . Breast lumpectomy  2009    breast cancer   Family History  Problem Relation Age of Onset  . Stroke Mother     51s  . Lung cancer Father   . Prostate cancer Father   . Breast cancer Sister   . Lung cancer Sister   . Breast cancer      aunt   Social History   Social History  . Marital Status: Divorced    Spouse Name: N/A  . Number of Children: 0  . Years of Education: N/A   Social History Main  Topics  . Smoking status: Never Smoker   . Smokeless tobacco: Never Used  . Alcohol Use: No  . Drug Use: No  . Sexual Activity: Not Asked   Other Topics Concern  . None   Social History Narrative    Outpatient Encounter Prescriptions as of 07/16/2015  Medication Sig  . Azelastine-Fluticasone 137-50 MCG/ACT SUSP Two spray each nostril once a day  . bismuth subsalicylate (PEPTO-BISMOL) 262 MG/15ML suspension Take 15 mLs by mouth every 6 (six) hours as needed.  . clonazePAM (KLONOPIN) 1 MG tablet Take 1 mg by mouth 2 (two) times daily.   Marland Kitchen escitalopram (LEXAPRO) 20 MG tablet Take 20 mg by mouth daily.    Marland Kitchen levothyroxine (SYNTHROID, LEVOTHROID) 88 MCG tablet TAKE 1 TABLET (88 MCG TOTAL) BY MOUTH DAILY BEFORE BREAKFAST.  . pantoprazole (PROTONIX) 40 MG tablet TAKE 1 TABLET BY MOUTH  DAILY.   No facility-administered encounter medications on file as of 07/16/2015.    Review of Systems  Constitutional: Negative for appetite change and unexpected weight change.  HENT: Negative for congestion and sinus pressure.   Respiratory: Negative for cough, chest tightness and shortness of  breath.   Cardiovascular: Negative for chest pain, palpitations and leg swelling.  Gastrointestinal: Negative for nausea, vomiting and abdominal pain.       Some diarrhea this am.  Some bloating and gas.    Genitourinary: Negative for dysuria and difficulty urinating.  Musculoskeletal: Negative for back pain and joint swelling.  Skin: Negative for color change and rash.  Neurological: Negative for dizziness, light-headedness and headaches.  Psychiatric/Behavioral: Negative for dysphoric mood and agitation.       Objective:    Physical Exam  Constitutional: She appears well-developed and well-nourished. No distress.  HENT:  Nose: Nose normal.  Mouth/Throat: Oropharynx is clear and moist.  Eyes: Conjunctivae are normal. Right eye exhibits no discharge. Left eye exhibits no discharge.  Neck: Neck supple. No  thyromegaly present.  Cardiovascular: Normal rate and regular rhythm.   Pulmonary/Chest: Breath sounds normal. No respiratory distress. She has no wheezes.  Abdominal: Soft. Bowel sounds are normal. There is no tenderness.  Musculoskeletal: She exhibits no edema or tenderness.  Lymphadenopathy:    She has no cervical adenopathy.  Skin: No rash noted. No erythema.  Psychiatric: She has a normal mood and affect. Her behavior is normal.    BP 140/80 mmHg  Pulse 91  Temp(Src) 98.1 F (36.7 C) (Oral)  Resp 18  Ht 5' 4.5" (1.638 m)  Wt 149 lb 12 oz (67.926 kg)  BMI 25.32 kg/m2  SpO2 96% Wt Readings from Last 3 Encounters:  07/16/15 149 lb 12 oz (67.926 kg)  04/11/15 152 lb (68.947 kg)  01/28/15 152 lb 4 oz (69.06 kg)     Lab Results  Component Value Date   WBC 4.4 12/11/2014   HGB 14.5 12/11/2014   HCT 41.8 12/11/2014   PLT 191 12/11/2014   GLUCOSE 90 07/11/2015   CHOL 211* 07/11/2015   TRIG 205.0* 07/11/2015   HDL 45.70 07/11/2015   LDLDIRECT 126.0 07/11/2015   LDLCALC 125* 03/19/2015   ALT 62* 07/11/2015   AST 22 07/11/2015   NA 145 07/11/2015   K 3.4* 07/11/2015   CL 104 07/11/2015   CREATININE 0.96 07/11/2015   BUN 16 07/11/2015   CO2 31 07/11/2015   TSH 1.33 07/11/2015   HGBA1C 5.2 08/30/2013    US Liver Elastography W/o Img  03/15/2015  CLINICAL DATA:  Hepatic steatosis, elevated liver enzymes. EXAM: ULTRASOUND HEPATIC ELASTOGRAPHY TECHNIQUE: Ultrasound elastography evaluation of the liver was performed. A region of interest was placed in the right lobe of the liver. Following application of a compressive sonographic pulse, shear waves were detected in the adjacent hepatic tissue and the shear wave velocity was calculated. Multiple assessments were performed at the selected site. Median shear wave velocity is correlated to a Metavir fibrosis score. COMPARISON:  02/26/2015 abdominal sonogram. FINDINGS: Device: Siemens Helix VTQ Transducer 6C1 Patient position:  Supine Number of measurements: 10 Hepatic segment:  8 Median velocity:   1.02  m/sec IQR: 0.25 IQR/Median velocity ratio: 0.24 Corresponding Metavir fibrosis score:  F0 / F1 Risk of fibrosis: Minimal Limitations of exam: None Pertinent findings noted on other imaging exams:  None Please note that abnormal shear wave velocities may also be identified in clinical settings other than with hepatic fibrosis, such as: acute hepatitis, elevated right heart and central venous pressures including use of beta blockers, veno-occlusive disease (Budd-Chiari), infiltrative processes such as mastocytosis/amyloidosis/infiltrative tumor, extrahepatic cholestasis, in the post-prandial state, and liver transplantation. Correlation with patient history, laboratory data, and clinical condition recommended. IMPRESSION: Median hepatic shear wave  velocity is calculated at 1.02 m/sec. Corresponding Metavir fibrosis score is  F0 / F1. Risk of fibrosis is minimal. Follow-up: None required. Electronically Signed   By: Ilona Sorrel M.D.   On: 03/15/2015 08:36       Assessment & Plan:   Problem List Items Addressed This Visit    Abnormal liver function test    Worked up and evaluated by GI.  See their note for details.  Follow liver function tests.        CARCINOMA, THYROID GLAND, PAPILLARY    Last quantitative thyroglobulin and antithyroglobulin ab - negative.  No documented h/o metastatic thyroid cancer.  Follow tsh.  On thyroid replacement.        Gas    Sees GI.  Discussed a trial of probiotic.  Follow.        GERD (gastroesophageal reflux disease) - Primary    On protonix,        History of malignant neoplasm of large intestine    Colonoscopy 08/02/14 as outlined.  Recommended f/u colonoscopy in 07/2017.        Hypercholesterolemia    Low cholesterol diet and exercise.  Follow lipid panel.        Malignant neoplasm of female breast ([place])    Mammogram 62/16 - Birads II.        Mild depression    Followed by  Dr Clovis Riley.        Stress    Discussed with her today.  Sees Dr Clovis Riley.  On lexapro.  Follow.        Vitamin D deficiency    Discussed with her today.  Vitamin D supplements.            [name], MD

## 2015-07-16 NOTE — Telephone Encounter (Signed)
Pt called about a prescription being called in for calcium. Please advise, that it has been sent. Thanks

## 2015-07-16 NOTE — Progress Notes (Signed)
Pre-visit discussion using our clinic review tool. No additional management support is needed unless otherwise documented below in the visit note.  

## 2015-07-17 NOTE — Telephone Encounter (Signed)
Notified pt. 

## 2015-07-27 ENCOUNTER — Encounter: Payer: Self-pay | Admitting: Internal Medicine

## 2015-07-27 DIAGNOSIS — E559 Vitamin D deficiency, unspecified: Secondary | ICD-10-CM | POA: Insufficient documentation

## 2015-07-27 NOTE — Assessment & Plan Note (Signed)
Sees GI.  Discussed a trial of probiotic.  Follow.

## 2015-07-27 NOTE — Assessment & Plan Note (Signed)
Followed by Dr Levine.   

## 2015-07-27 NOTE — Assessment & Plan Note (Signed)
Low cholesterol diet and exercise.  Follow lipid panel.   

## 2015-07-27 NOTE — Assessment & Plan Note (Signed)
Colonoscopy 08/02/14 as outlined.  Recommended f/u colonoscopy in 07/2017.

## 2015-07-27 NOTE — Assessment & Plan Note (Signed)
Worked up and evaluated by GI.  See their note for details.  Follow liver function tests.

## 2015-07-27 NOTE — Assessment & Plan Note (Signed)
Mammogram 10/04/14 - Birads II.   

## 2015-07-27 NOTE — Assessment & Plan Note (Signed)
Discussed with her today.  Sees Dr Clovis Riley.  On lexapro.  Follow.

## 2015-07-27 NOTE — Assessment & Plan Note (Signed)
Last quantitative thyroglobulin and antithyroglobulin ab - negative.  No documented h/o metastatic thyroid cancer.  Follow tsh.  On thyroid replacement.

## 2015-07-27 NOTE — Assessment & Plan Note (Signed)
Discussed with her today.  Vitamin D supplements.

## 2015-07-27 NOTE — Assessment & Plan Note (Signed)
On protonix,

## 2015-08-08 DIAGNOSIS — J069 Acute upper respiratory infection, unspecified: Secondary | ICD-10-CM | POA: Diagnosis not present

## 2015-08-08 DIAGNOSIS — J019 Acute sinusitis, unspecified: Secondary | ICD-10-CM | POA: Diagnosis not present

## 2015-08-08 DIAGNOSIS — B9689 Other specified bacterial agents as the cause of diseases classified elsewhere: Secondary | ICD-10-CM | POA: Diagnosis not present

## 2015-09-26 ENCOUNTER — Telehealth: Payer: Self-pay | Admitting: *Deleted

## 2015-09-26 DIAGNOSIS — R748 Abnormal levels of other serum enzymes: Secondary | ICD-10-CM | POA: Diagnosis not present

## 2015-09-26 DIAGNOSIS — K580 Irritable bowel syndrome with diarrhea: Secondary | ICD-10-CM | POA: Diagnosis not present

## 2015-09-26 DIAGNOSIS — K760 Fatty (change of) liver, not elsewhere classified: Secondary | ICD-10-CM | POA: Diagnosis not present

## 2015-09-26 DIAGNOSIS — E876 Hypokalemia: Secondary | ICD-10-CM | POA: Diagnosis not present

## 2015-09-26 NOTE — Telephone Encounter (Signed)
Voicemail; patient requested to be seen by Dr.Scott ASAP. She was seen by Dr. Dawson Bills who advised she have a EKG. Patient has heart flutters. Hx of palpitations  *transferred to nurse line

## 2015-09-27 ENCOUNTER — Ambulatory Visit (INDEPENDENT_AMBULATORY_CARE_PROVIDER_SITE_OTHER): Payer: PPO | Admitting: *Deleted

## 2015-09-27 ENCOUNTER — Encounter: Payer: Self-pay | Admitting: Cardiovascular Disease

## 2015-09-27 VITALS — BP 114/84 | HR 85 | Ht 64.5 in | Wt 144.5 lb

## 2015-09-27 DIAGNOSIS — R002 Palpitations: Secondary | ICD-10-CM | POA: Diagnosis not present

## 2015-09-27 DIAGNOSIS — E876 Hypokalemia: Secondary | ICD-10-CM | POA: Diagnosis not present

## 2015-09-27 DIAGNOSIS — K580 Irritable bowel syndrome with diarrhea: Secondary | ICD-10-CM | POA: Diagnosis not present

## 2015-09-27 DIAGNOSIS — K760 Fatty (change of) liver, not elsewhere classified: Secondary | ICD-10-CM | POA: Diagnosis not present

## 2015-09-27 DIAGNOSIS — R748 Abnormal levels of other serum enzymes: Secondary | ICD-10-CM | POA: Diagnosis not present

## 2015-09-27 NOTE — Telephone Encounter (Signed)
Spoke with patient and she has started to take her prescription for potassium given to her by Dr Jerelene Redden.  Patient states that she is feeling better today.  Advised patient to give her cardiologist a call, and If symptoms persist or increases he may want to go to the ER.  Patient verbally agreed.

## 2015-09-27 NOTE — Progress Notes (Signed)
1.) Reason for visit: Patient felt her heart was fluttering. She called her PCP and they said to come get an EKG.  2.) Name of MD requesting visit: Dr. Randell Patient Scott's office  3.) H&P: Patient was last seen 03/13/14. She states that she has been having some heart fluttering but denies any chest pain or SOB.    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 Unfortunately, she could not afford colchicine. If she develops any recurrent episodes, I recommend a 3-6 months course of colchicine and repeating echocardiogram. In the meanwhile, she is completely asymptomatic and can follow-up with me as needed.         4.) ROS related to problem: No chest pain, shortness of breath or any other symptoms.   5.) Assessment and plan: EKG showed SR with low voltage QRS and nonspecific T wave abnormality. Follow up appointment scheduled with Dr. Fletcher Anon and instructed patient to please go to ED if she should develop any CP, SOB, or other symptoms. She verbalized understanding of all instructions and had no further questions at this time.

## 2015-09-27 NOTE — Telephone Encounter (Signed)
This encounter was created in error - please disregard.

## 2015-10-01 ENCOUNTER — Ambulatory Visit (INDEPENDENT_AMBULATORY_CARE_PROVIDER_SITE_OTHER): Payer: PPO | Admitting: Cardiovascular Disease

## 2015-10-01 ENCOUNTER — Encounter: Payer: Self-pay | Admitting: Cardiovascular Disease

## 2015-10-01 VITALS — BP 114/80 | HR 79 | Ht 64.5 in | Wt 144.2 lb

## 2015-10-01 DIAGNOSIS — R002 Palpitations: Secondary | ICD-10-CM

## 2015-10-01 NOTE — Progress Notes (Signed)
Cardiology Office Note   Date:  10/01/2015   ID:  Debra Hale, DOB Sep 04, 1950, MRN [id number]  PCP:  [name], MD  Cardiologist:   Kathlyn Sacramento, MD   Chief Complaint  Patient presents with  . other    Patient C/O heart palpitations. Medications verbally reviewed with patient.       History of Present Illness: Debra Hale is a 65 y.o. female who presents for Evaluation of palpitations. She was seen by me in 2015 for recurrent pericarditis with documented small pericardial effusion which subsequently resolved with medical therapy.  She has known history of hyperlipidemia. There is no history of hypertension, diabetes, tobacco use and no family history of coronary artery disease. Other medical conditions include thyroid carcinoma s/p surgery and XRT, colon cancer s/p laparoscopic right colectomy followed by Dr Ma Hillock, nephrolithiasis, anxiety, osteoporosis and breast cancer.  She suffers from irritable bowel syndrome with chronic diarrhea. Recently, she started having palpitations and was evaluated by her primary care provider. Labs showed severe hypokalemia with a potassium of 2.9 and hypomagnesemia at 1.5. She was started on replacement for both with resolution of palpitations. She reports that current symptoms are different from her prior episodes of pericarditis and they only manifest this time by palpitations and skipping in her heart without chest pain. She denies any shortness of breath. She did have recent sinusitis that was treated with antibiotics.   Past Medical History  Diagnosis Date  . Palpitations   . Other and unspecified hyperlipidemia   . Unspecified hereditary and idiopathic peripheral neuropathy   . Personal history of malignant neoplasm of large intestine     carcinoma - cecum, s/p right laparoscopic colectomy - s/p chemotherapy and XRT  . Anxiety   . Liver nodule     s/p negative biopsy  . Nephrolithiasis     s/p lithotripsy  . GERD  (gastroesophageal reflux disease)   . Osteoporosis   . Malignant neoplasm of breast (female), unspecified site     s/p lumpectomy  . Malignant neoplasm of thyroid gland [facility])     s/p surgery and XRT    Past Surgical History  Procedure Laterality Date  . Appendectomy  1985  . Cholecystectomy  1995  . Thyroid lobectomy  2002    s/p XRT  . Laparoscopic partial colectomy      stage 3-C carcinoma of the cecum, s/p chemotherapy and xrt  . Lithotripsy    . Dilation and curettage of uterus  1990  . Breast lumpectomy  2009    breast cancer     Current Outpatient Prescriptions  Medication Sig Dispense Refill  . Azelastine-Fluticasone 137-50 MCG/ACT SUSP Two spray each nostril once a day 23 g 3  . clonazePAM (KLONOPIN) 1 MG tablet Take 1 mg by mouth 2 (two) times daily.     . ergocalciferol (VITAMIN D2) 50000 units capsule Take 1 capsule (50,000 Units total) by mouth once a week. 4 capsule 3  . escitalopram (LEXAPRO) 20 MG tablet Take 20 mg by mouth daily.      Marland Kitchen levothyroxine (SYNTHROID, LEVOTHROID) 88 MCG tablet TAKE 1 TABLET (88 MCG TOTAL) BY MOUTH DAILY BEFORE BREAKFAST. 90 tablet 2  . loperamide (IMODIUM) 1 MG/5ML solution Take 1 mg by mouth as needed for diarrhea or loose stools.    Marland Kitchen MAGNESIUM PO Take by mouth daily.    . pantoprazole (PROTONIX) 40 MG tablet TAKE 1 TABLET BY MOUTH  DAILY. 90 tablet 2  . potassium  chloride SA (K-DUR,KLOR-CON) 20 MEQ tablet Take 20 mEq by mouth 2 (two) times daily.    Marland Kitchen bismuth subsalicylate (PEPTO-BISMOL) 262 MG/15ML suspension Take 15 mLs by mouth every 6 (six) hours as needed. Reported on 10/01/2015     No current facility-administered medications for this visit.    Allergies:   Augmentin; Bentyl; Ciprofloxacin; Codeine; Epinephrine; Flagyl; Librax; Phenobarbital; Tetracyclines & related; and Ultram    Social History:  The patient  reports that she has never smoked. She has never used smokeless tobacco. She reports that she does not drink  alcohol or use illicit drugs.   Family History:  The patient's family history includes Breast cancer in her sister; Lung cancer in her father and sister; Prostate cancer in her father; Stroke in her mother.    ROS:  Please see the history of present illness.   Otherwise, review of systems are positive for none.   All other systems are reviewed and negative.    PHYSICAL EXAM: VS:  BP 114/80 mmHg  Pulse 79  Ht 5' 4.5" (1.638 m)  Wt 144 lb 3.2 oz (65.409 kg)  BMI 24.38 kg/m2 , BMI Body mass index is 24.38 kg/(m^2). GEN: Well nourished, well developed, in no acute distress HEENT: normal Neck: no JVD, carotid bruits, or masses Cardiac: RRR; no murmurs, rubs, or gallops,no edema  Respiratory:  clear to auscultation bilaterally, normal work of breathing GI: soft, nontender, nondistended, + BS MS: no deformity or atrophy Skin: warm and dry, no rash Neuro:  Strength and sensation are intact Psych: euthymic mood, full affect   EKG:  EKG is ordered today. The ekg ordered today demonstrates normal sinus rhythm with low voltage.   Recent Labs: 12/11/2014: Hemoglobin 14.5; Platelets 191 07/11/2015: ALT 62*; BUN 16; Creatinine, Ser 0.96; Potassium 3.4*; Sodium 145; TSH 1.33    Lipid Panel    Component Value Date/Time   CHOL 211* 07/11/2015 0956   TRIG 205.0* 07/11/2015 0956   HDL 45.70 07/11/2015 0956   CHOLHDL 5 07/11/2015 0956   VLDL 41.0* 07/11/2015 0956   LDLCALC 125* 03/19/2015 1000   LDLDIRECT 126.0 07/11/2015 0956      Wt Readings from Last 3 Encounters:  10/01/15 144 lb 3.2 oz (65.409 kg)  09/27/15 144 lb 8 oz (65.545 kg)  07/16/15 149 lb 12 oz (67.926 kg)       ASSESSMENT AND PLAN:  1.  Palpitations: From her description, these seem to be due to premature beats that were triggered by severe hypokalemia and hypomagnesemia. Her symptoms resolved completely after starting on replacement for both. Thus, there is little utility for further cardiac evaluation. Her symptoms  are different from prior pericarditis. She can follow-up or call us if symptoms worsen.  2. Prior history of pericarditis: Current EKG is unremarkable and cardiac exam does not reveal any pericardial rubs.    Disposition:   FU with me as needed  Signed,  Kathlyn Sacramento, MD  10/01/2015 4:40 PM    [facility]

## 2015-10-01 NOTE — Patient Instructions (Signed)
Medication Instructions: Continue same medications.   Labwork: None.   Procedures/Testing: None.   Follow-Up: As needed with Dr. .   Any Additional Special Instructions Will Be Listed Below (If Applicable).     If you need a refill on your cardiac medications before your next appointment, please call your pharmacy.   

## 2015-10-03 ENCOUNTER — Other Ambulatory Visit
Admission: RE | Admit: 2015-10-03 | Discharge: 2015-10-03 | Disposition: A | Discharge status: Home / Self Care | Payer: PPO | Source: Ambulatory Visit | Attending: Nurse Practitioner | Admitting: Nurse Practitioner

## 2015-10-03 DIAGNOSIS — K580 Irritable bowel syndrome with diarrhea: Secondary | ICD-10-CM | POA: Diagnosis not present

## 2015-10-03 DIAGNOSIS — E876 Hypokalemia: Secondary | ICD-10-CM | POA: Diagnosis not present

## 2015-10-03 LAB — GASTROINTESTINAL PANEL BY PCR, STOOL (REPLACES STOOL CULTURE)

## 2015-10-08 ENCOUNTER — Encounter: Payer: Self-pay | Admitting: Internal Medicine

## 2015-10-08 ENCOUNTER — Other Ambulatory Visit: Payer: Self-pay | Admitting: Internal Medicine

## 2015-10-08 ENCOUNTER — Ambulatory Visit
Admission: RE | Admit: 2015-10-08 | Discharge: 2015-10-08 | Disposition: A | Discharge status: Home / Self Care | Payer: PPO | Source: Ambulatory Visit | Attending: Internal Medicine | Admitting: Internal Medicine

## 2015-10-08 ENCOUNTER — Ambulatory Visit (INDEPENDENT_AMBULATORY_CARE_PROVIDER_SITE_OTHER): Payer: PPO | Admitting: Internal Medicine

## 2015-10-08 VITALS — BP 120/84 | HR 82 | Temp 98.3°F | Wt 147.0 lb

## 2015-10-08 DIAGNOSIS — C50919 Malignant neoplasm of unspecified site of unspecified female breast: Secondary | ICD-10-CM

## 2015-10-08 DIAGNOSIS — E7800 Pure hypercholesterolemia, unspecified: Secondary | ICD-10-CM

## 2015-10-08 DIAGNOSIS — K219 Gastro-esophageal reflux disease without esophagitis: Secondary | ICD-10-CM

## 2015-10-08 DIAGNOSIS — C73 Malignant neoplasm of thyroid gland: Secondary | ICD-10-CM | POA: Diagnosis not present

## 2015-10-08 DIAGNOSIS — R7989 Other specified abnormal findings of blood chemistry: Secondary | ICD-10-CM

## 2015-10-08 DIAGNOSIS — Z1231 Encounter for screening mammogram for malignant neoplasm of breast: Secondary | ICD-10-CM | POA: Diagnosis not present

## 2015-10-08 DIAGNOSIS — E876 Hypokalemia: Secondary | ICD-10-CM

## 2015-10-08 DIAGNOSIS — R945 Abnormal results of liver function studies: Secondary | ICD-10-CM

## 2015-10-08 DIAGNOSIS — Z853 Personal history of malignant neoplasm of breast: Secondary | ICD-10-CM | POA: Insufficient documentation

## 2015-10-08 DIAGNOSIS — R002 Palpitations: Secondary | ICD-10-CM

## 2015-10-08 DIAGNOSIS — Z85038 Personal history of other malignant neoplasm of large intestine: Secondary | ICD-10-CM

## 2015-10-08 DIAGNOSIS — Z658 Other specified problems related to psychosocial circumstances: Secondary | ICD-10-CM

## 2015-10-08 DIAGNOSIS — R221 Localized swelling, mass and lump, neck: Secondary | ICD-10-CM

## 2015-10-08 DIAGNOSIS — F439 Reaction to severe stress, unspecified: Secondary | ICD-10-CM

## 2015-10-08 DIAGNOSIS — F419 Anxiety disorder, unspecified: Secondary | ICD-10-CM

## 2015-10-08 HISTORY — DX: Malignant neoplasm of unspecified site of unspecified female breast: C50.919

## 2015-10-08 HISTORY — DX: Malignant neoplasm of colon, unspecified: C18.9

## 2015-10-08 MED ORDER — MAGNESIUM OXIDE 400 MG PO TABS: 400.0000 mg | ORAL_TABLET | Freq: Every day | ORAL | Status: DC

## 2015-10-08 NOTE — Progress Notes (Signed)
Pre visit review using our clinic review tool, if applicable. No additional management support is needed unless otherwise documented below in the visit note. 

## 2015-10-08 NOTE — Progress Notes (Signed)
Patient ID: Debra Hale, female   DOB: 1950/11/11, 65 y.o.   MRN: [id number]   Subjective:    Patient ID: Debra Hale, female    DOB: 18-Sep-1950, 65 y.o.   MRN: [id number]  HPI  Patient here for a scheduled follow up.  She saw cardiology.  Was placed on potassium and magnesium.  Heart fluttering resolved.  She is off potassium and magnesium now.  No chest pain.  No sob.  No acid reflux.  No abdominal pain or cramping.  Bowels stable.     Past Medical History  Diagnosis Date  . Palpitations   . Other and unspecified hyperlipidemia   . Unspecified hereditary and idiopathic peripheral neuropathy   . Personal history of malignant neoplasm of large intestine     carcinoma - cecum, s/p right laparoscopic colectomy - s/p chemotherapy and XRT  . Anxiety   . Liver nodule     s/p negative biopsy  . Nephrolithiasis     s/p lithotripsy  . GERD (gastroesophageal reflux disease)   . Osteoporosis   . Colon cancer ([place])     surgery with chemo and rad tx  . Malignant neoplasm of thyroid gland ([place]) 2002    s/p surgery and XRT  . Breast cancer [facility]) 2009    right breast lumpectomy with rad tx   Past Surgical History  Procedure Laterality Date  . Appendectomy  1985  . Cholecystectomy  1995  . Thyroid lobectomy  2002    s/p XRT  . Laparoscopic partial colectomy      stage 3-C carcinoma of the cecum, s/p chemotherapy and xrt  . Lithotripsy    . Dilation and curettage of uterus  1990  . Breast lumpectomy  2009    breast cancer  . Breast biopsy Right 2009    positive  . Breast biopsy Right 2009    negative   Family History  Problem Relation Age of Onset  . Stroke Mother     86s  . Lung cancer Father   . Prostate cancer Father   . Breast cancer Sister     32's  . Lung cancer Sister   . Breast cancer Maternal Aunt    Social History   Social History  . Marital Status: Divorced    Spouse Name: N/A  . Number of Children: 0  . Years of Education: N/A   Social History Main  Topics  . Smoking status: Never Smoker   . Smokeless tobacco: Never Used  . Alcohol Use: No  . Drug Use: No  . Sexual Activity: Not Asked   Other Topics Concern  . None   Social History Narrative    Outpatient Encounter Prescriptions as of 10/08/2015  Medication Sig  . Azelastine-Fluticasone 137-50 MCG/ACT SUSP Two spray each nostril once a day  . bismuth subsalicylate (PEPTO-BISMOL) 262 MG/15ML suspension Take 15 mLs by mouth every 6 (six) hours as needed. Reported on 10/01/2015  . clonazePAM (KLONOPIN) 1 MG tablet Take 1 mg by mouth 2 (two) times daily.   . ergocalciferol (VITAMIN D2) 50000 units capsule Take 1 capsule (50,000 Units total) by mouth once a week.  . escitalopram (LEXAPRO) 20 MG tablet Take 20 mg by mouth daily.    Marland Kitchen levothyroxine (SYNTHROID, LEVOTHROID) 88 MCG tablet TAKE 1 TABLET (88 MCG TOTAL) BY MOUTH DAILY BEFORE BREAKFAST.  Marland Kitchen loperamide (IMODIUM) 1 MG/5ML solution Take 1 mg by mouth as needed for diarrhea or loose stools.  . pantoprazole (PROTONIX) 40 MG  tablet TAKE 1 TABLET BY MOUTH  DAILY.  . magnesium oxide (MAG-OX) 400 MG tablet Take 1 tablet (400 mg total) by mouth daily.  Marland Kitchen MAGNESIUM PO Take by mouth daily. Reported on 10/08/2015  . potassium chloride SA (K-DUR,KLOR-CON) 20 MEQ tablet Take 20 mEq by mouth 2 (two) times daily. Reported on 10/08/2015   No facility-administered encounter medications on file as of 10/08/2015.    Review of Systems  Constitutional: Negative for appetite change and unexpected weight change.  HENT: Negative for congestion and sinus pressure.   Respiratory: Negative for cough, chest tightness and shortness of breath.   Cardiovascular: Negative for chest pain, palpitations and leg swelling.  Gastrointestinal: Negative for nausea, vomiting, abdominal pain and diarrhea.  Genitourinary: Negative for dysuria and difficulty urinating.  Musculoskeletal: Negative for back pain and joint swelling.  Skin: Negative for color change and rash.    Neurological: Negative for dizziness, light-headedness and headaches.  Psychiatric/Behavioral: Negative for dysphoric mood and agitation.       Objective:    Physical Exam  Constitutional: She appears well-developed and well-nourished. No distress.  HENT:  Nose: Nose normal.  Mouth/Throat: Oropharynx is clear and moist.  Neck: Neck supple. No thyromegaly present.  Left anterior neck nodule.   Cardiovascular: Normal rate and regular rhythm.   Pulmonary/Chest: Breath sounds normal. No respiratory distress. She has no wheezes.  Abdominal: Soft. Bowel sounds are normal. There is no tenderness.  Musculoskeletal: She exhibits no edema or tenderness.  Skin: No rash noted. No erythema.  Psychiatric: She has a normal mood and affect. Her behavior is normal.    BP 120/84 mmHg  Pulse 82  Temp(Src) 98.3 F (36.8 C) (Oral)  Wt 147 lb (66.679 kg) Wt Readings from Last 3 Encounters:  10/08/15 147 lb (66.679 kg)  10/01/15 144 lb 3.2 oz (65.409 kg)  09/27/15 144 lb 8 oz (65.545 kg)     Lab Results  Component Value Date   WBC 4.4 12/11/2014   HGB 14.5 12/11/2014   HCT 41.8 12/11/2014   PLT 191 12/11/2014   GLUCOSE 90 07/11/2015   CHOL 211* 07/11/2015   TRIG 205.0* 07/11/2015   HDL 45.70 07/11/2015   LDLDIRECT 126.0 07/11/2015   LDLCALC 125* 03/19/2015   ALT 62* 07/11/2015   AST 22 07/11/2015   NA 145 07/11/2015   K 3.4* 07/11/2015   CL 104 07/11/2015   CREATININE 0.96 07/11/2015   BUN 16 07/11/2015   CO2 31 07/11/2015   TSH 1.33 07/11/2015   HGBA1C 5.2 08/30/2013    Mm Screening Breast Tomo Bilateral  10/08/2015  CLINICAL DATA:  Screening. EXAM: 2D DIGITAL SCREENING BILATERAL MAMMOGRAM WITH CAD AND ADJUNCT TOMO COMPARISON:  Previous exam(s). ACR Breast Density Category c: The breast tissue is heterogeneously dense, which may obscure small masses. FINDINGS: There are no findings suspicious for malignancy. Stable benign postsurgical changes on the right. Images were processed  with CAD. IMPRESSION: No mammographic evidence of malignancy. A result letter of this screening mammogram will be mailed directly to the patient. RECOMMENDATION: Screening mammogram in one year. (Code:SM-B-01Y) BI-RADS CATEGORY  2: Benign. Electronically Signed   By: Lajean Manes M.D.   On: 10/08/2015 12:28       Assessment & Plan:   Problem List Items Addressed This Visit    Abnormal liver function test    Worked up by GI.  Follow liver function tests.        Anxiety    On lexapro.  Stable.  Followed  by Dr Clovis Riley.        CARCINOMA, THYROID GLAND, PAPILLARY    On thyroid replacement.  Follow tsh.        GERD (gastroesophageal reflux disease)    On protonix.  Controlled.        Relevant Medications   magnesium oxide (MAG-OX) 400 MG tablet   History of malignant neoplasm of large intestine    Colonoscopy 08/02/14 as outlined.  Recommended f/u colonoscopy in 07/2017.        Hypercholesterolemia    Low cholesterol diet and exercise.  Follow lipid panel.        Malignant neoplasm of female breast ([place])    Mammogram 10/08/15 - [facility] II.       Neck nodule    Left anterior neck nodule.  Due to f/u soon with hematology.  Have them reevaluate.  Question if reactive.  If persistent, will need further w/up.        PALPITATIONS    Better after taking potassium and magnesium.  Off now.  Restart magnesium.  Recheck potassium level.        Stress    Doing better.  On lexapro.  Follow.         Other Visit Diagnoses    Hypokalemia    -  Primary    Relevant Orders    Basic metabolic panel        [name], MD

## 2015-10-13 ENCOUNTER — Encounter: Payer: Self-pay | Admitting: Internal Medicine

## 2015-10-13 DIAGNOSIS — R221 Localized swelling, mass and lump, neck: Secondary | ICD-10-CM | POA: Insufficient documentation

## 2015-10-13 NOTE — Assessment & Plan Note (Signed)
Mammogram 10/08/15 - Birads II.

## 2015-10-13 NOTE — Assessment & Plan Note (Signed)
Low cholesterol diet and exercise.  Follow lipid panel.   

## 2015-10-13 NOTE — Assessment & Plan Note (Signed)
On thyroid replacement.  Follow tsh.  

## 2015-10-13 NOTE — Assessment & Plan Note (Signed)
Better after taking potassium and magnesium.  Off now.  Restart magnesium.  Recheck potassium level.

## 2015-10-13 NOTE — Assessment & Plan Note (Signed)
Colonoscopy 08/02/14 as outlined.  Recommended f/u colonoscopy in 07/2017.

## 2015-10-13 NOTE — Assessment & Plan Note (Signed)
Worked up by GI.  Follow liver function tests.   

## 2015-10-13 NOTE — Assessment & Plan Note (Signed)
Left anterior neck nodule.  Due to f/u soon with hematology.  Have them reevaluate.  Question if reactive.  If persistent, will need further w/up.

## 2015-10-13 NOTE — Assessment & Plan Note (Signed)
Doing better.  On lexapro.  Follow.  

## 2015-10-13 NOTE — Assessment & Plan Note (Signed)
On lexapro.  Stable.  Followed by Dr Clovis Riley.

## 2015-10-13 NOTE — Assessment & Plan Note (Signed)
On protonix.  Controlled.   

## 2015-10-17 ENCOUNTER — Ambulatory Visit (INDEPENDENT_AMBULATORY_CARE_PROVIDER_SITE_OTHER): Payer: PPO | Admitting: Internal Medicine

## 2015-10-17 ENCOUNTER — Other Ambulatory Visit (INDEPENDENT_AMBULATORY_CARE_PROVIDER_SITE_OTHER): Payer: PPO

## 2015-10-17 ENCOUNTER — Other Ambulatory Visit: Payer: Self-pay | Admitting: *Deleted

## 2015-10-17 ENCOUNTER — Telehealth: Payer: Self-pay

## 2015-10-17 ENCOUNTER — Other Ambulatory Visit: Payer: Self-pay | Admitting: Internal Medicine

## 2015-10-17 ENCOUNTER — Encounter: Payer: Self-pay | Admitting: Internal Medicine

## 2015-10-17 VITALS — BP 132/62 | HR 95 | Resp 18 | Ht 64.5 in

## 2015-10-17 DIAGNOSIS — R195 Other fecal abnormalities: Secondary | ICD-10-CM

## 2015-10-17 DIAGNOSIS — R002 Palpitations: Secondary | ICD-10-CM

## 2015-10-17 DIAGNOSIS — E876 Hypokalemia: Secondary | ICD-10-CM

## 2015-10-17 DIAGNOSIS — C73 Malignant neoplasm of thyroid gland: Secondary | ICD-10-CM | POA: Diagnosis not present

## 2015-10-17 LAB — BASIC METABOLIC PANEL
BUN: 18 mg/dL (ref 6–23)
CALCIUM: 9.7 mg/dL (ref 8.4–10.5)
CO2: 29 mEq/L (ref 19–32)
Chloride: 105 mEq/L (ref 96–112)
Creatinine, Ser: 1.03 mg/dL (ref 0.40–1.20)
GFR: 57 mL/min — AB (ref >60.00)
GLUCOSE: 143 mg/dL — AB (ref 70–99)
Potassium: 3.2 mEq/L — ABNORMAL LOW (ref 3.5–5.1)
Sodium: 142 mEq/L (ref 135–145)

## 2015-10-17 MED ORDER — POTASSIUM CHLORIDE ER 10 MEQ PO TBCR: 10.0000 meq | EXTENDED_RELEASE_TABLET | Freq: Every day | ORAL | Status: DC

## 2015-10-17 NOTE — Telephone Encounter (Signed)
Pt seen. See note.

## 2015-10-17 NOTE — Progress Notes (Signed)
Patient ID: Debra Hale, female   DOB: [birth year], 65 y.o.   MRN: [id number]   Subjective:    Patient ID: Debra Hale, female    DOB: [birth year], 65 y.o.   MRN: [id number]  HPI  Patient here as a work in with concerns regarding palpitations.  Has a history of palpitations.  Recently saw Dr Fletcher Anon.  Refer to his note.  With replacement of her her low potassium and starting magnesium, palpitations were better on that visit.  See his note.  No further cardiac w/up felt warranted.  Once potassium normal, supplements stopped.  On my last visit, we elected to start her back on magnesium.  She states the magnesium caused some bloating.  States bowels movements were more normal.  Started having diarrhea yesterday.  Had six watery stools last night.  Palpitations returned.  Increased - fluttering.  No increased heart rate.  No sob.  No chest pain.  No nausea or vomiting.  Did not sleep.  She stayed awake.     Past Medical History  Diagnosis Date  . Palpitations   . Other and unspecified hyperlipidemia   . Unspecified hereditary and idiopathic peripheral neuropathy   . Personal history of malignant neoplasm of large intestine     carcinoma - cecum, s/p right laparoscopic colectomy - s/p chemotherapy and XRT  . Anxiety   . Liver nodule     s/p negative biopsy  . Nephrolithiasis     s/p lithotripsy  . GERD (gastroesophageal reflux disease)   . Osteoporosis   . Colon cancer ([place])     surgery with chemo and rad tx  . Malignant neoplasm of thyroid gland ([place]) 2002    s/p surgery and XRT  . Breast cancer [facility]) 2009    right breast lumpectomy with rad tx   Past Surgical History  Procedure Laterality Date  . Appendectomy  1985  . Cholecystectomy  1995  . Thyroid lobectomy  2002    s/p XRT  . Laparoscopic partial colectomy      stage 3-C carcinoma of the cecum, s/p chemotherapy and xrt  . Lithotripsy    . Dilation and curettage of uterus  1990  . Breast lumpectomy  2009    breast cancer  .  Breast biopsy Right 2009    positive  . Breast biopsy Right 2009    negative   Family History  Problem Relation Age of Onset  . Stroke Mother     66s  . Lung cancer Father   . Prostate cancer Father   . Breast cancer Sister     33's  . Lung cancer Sister   . Breast cancer Maternal Aunt    Social History   Social History  . Marital Status: Divorced    Spouse Name: N/A  . Number of Children: 0  . Years of Education: N/A   Social History Main Topics  . Smoking status: Never Smoker   . Smokeless tobacco: Never Used  . Alcohol Use: No  . Drug Use: No  . Sexual Activity: Not Asked   Other Topics Concern  . None   Social History Narrative    Outpatient Encounter Prescriptions as of 10/17/2015  Medication Sig  . Azelastine-Fluticasone 137-50 MCG/ACT SUSP Two spray each nostril once a day  . bismuth subsalicylate (PEPTO-BISMOL) 262 MG/15ML suspension Take 15 mLs by mouth every 6 (six) hours as needed. Reported on 10/01/2015  . clonazePAM (KLONOPIN) 1 MG tablet Take 1 mg by mouth 2 (  two) times daily.   . ergocalciferol (VITAMIN D2) 50000 units capsule Take 1 capsule (50,000 Units total) by mouth once a week.  . escitalopram (LEXAPRO) 20 MG tablet Take 20 mg by mouth daily.    Marland Kitchen levothyroxine (SYNTHROID, LEVOTHROID) 88 MCG tablet TAKE 1 TABLET (88 MCG TOTAL) BY MOUTH DAILY BEFORE BREAKFAST.  Marland Kitchen loperamide (IMODIUM) 1 MG/5ML solution Take 1 mg by mouth as needed for diarrhea or loose stools.  Marland Kitchen MAGNESIUM PO Take by mouth daily. Reported on 10/08/2015  . pantoprazole (PROTONIX) 40 MG tablet TAKE 1 TABLET BY MOUTH  DAILY.  . magnesium oxide (MAG-OX) 400 MG tablet Take 1 tablet (400 mg total) by mouth daily. (Patient not taking: Reported on 10/17/2015)  . [DISCONTINUED] potassium chloride SA (K-DUR,KLOR-CON) 20 MEQ tablet Take 20 mEq by mouth 2 (two) times daily. Reported on 10/17/2015   No facility-administered encounter medications on file as of 10/17/2015.    Review of Systems    Constitutional: Negative for appetite change and unexpected weight change.  HENT: Negative for congestion and sinus pressure.   Respiratory: Negative for cough, chest tightness and shortness of breath.   Cardiovascular: Positive for palpitations. Negative for chest pain and leg swelling.  Gastrointestinal: Positive for diarrhea. Negative for nausea, vomiting and abdominal pain.  Genitourinary: Negative for dysuria and difficulty urinating.  Musculoskeletal: Negative for myalgias and joint swelling.  Skin: Negative for color change and rash.  Neurological: Negative for dizziness, light-headedness and headaches.  Psychiatric/Behavioral: Negative for dysphoric mood and agitation.       Objective:    Physical Exam  Constitutional: She appears well-developed and well-nourished. No distress.  HENT:  Nose: Nose normal.  Mouth/Throat: Oropharynx is clear and moist.  Neck: Neck supple. No thyromegaly present.  Cardiovascular: Normal rate and regular rhythm.   Pulmonary/Chest: Breath sounds normal. No respiratory distress. She has no wheezes.  Abdominal: Soft. Bowel sounds are normal. There is no tenderness.  Musculoskeletal: She exhibits no edema or tenderness.  Lymphadenopathy:    She has no cervical adenopathy.  Skin: No rash noted. No erythema.  Psychiatric: She has a normal mood and affect. Her behavior is normal.    BP 132/62 mmHg  Pulse 95  Resp 18  Ht 5' 4.5" (1.638 m)  SpO2 96% Wt Readings from Last 3 Encounters:  10/08/15 147 lb (66.679 kg)  10/01/15 144 lb 3.2 oz (65.409 kg)  09/27/15 144 lb 8 oz (65.545 kg)     Lab Results  Component Value Date   WBC 4.4 12/11/2014   HGB 14.5 12/11/2014   HCT 41.8 12/11/2014   PLT 191 12/11/2014   GLUCOSE 143* 10/17/2015   CHOL 211* 07/11/2015   TRIG 205.0* 07/11/2015   HDL 45.70 07/11/2015   LDLDIRECT 126.0 07/11/2015   LDLCALC 125* 03/19/2015   ALT 62* 07/11/2015   AST 22 07/11/2015   NA 142 10/17/2015   K 3.2*  10/17/2015   CL 105 10/17/2015   CREATININE 1.03 10/17/2015   BUN 18 10/17/2015   CO2 29 10/17/2015   TSH 1.33 07/11/2015   HGBA1C 5.2 08/30/2013    Mm Screening Breast Tomo Bilateral  10/08/2015  CLINICAL DATA:  Screening. EXAM: 2D DIGITAL SCREENING BILATERAL MAMMOGRAM WITH CAD AND ADJUNCT TOMO COMPARISON:  Previous exam(s). ACR Breast Density Category c: The breast tissue is heterogeneously dense, which may obscure small masses. FINDINGS: There are no findings suspicious for malignancy. Stable benign postsurgical changes on the right. Images were processed with CAD. IMPRESSION: No mammographic evidence  of malignancy. A result letter of this screening mammogram will be mailed directly to the patient. RECOMMENDATION: Screening mammogram in one year. (Code:SM-B-01Y) BI-RADS CATEGORY  2: Benign. Electronically Signed   By: Lajean Manes M.D.   On: 10/08/2015 12:28       Assessment & Plan:   Problem List Items Addressed This Visit    CARCINOMA, THYROID GLAND, PAPILLARY - Primary    Recent tsh wnl.       Loose stools    Seeing GI.        PALPITATIONS    Improved when on potassium and magnesium supplements.  Once potassium replaced, potassium supplements stopped.  Had increased diarrhea yesterday.  Off magnesium now.  Check metabolic panel.  Replace potassium if low.  Palpitations have subsided now.  EKG revealed SR with no acute ischemic changes.  If persistent or if potassium wnl, refer back to cardiology for further evaluation.  Pt comfortable with this plan.  Follow.          I spent 25 minutes with the patient and more than 50% of the time was spent in consultation regarding the above.     [name], MD

## 2015-10-17 NOTE — Telephone Encounter (Signed)
Rx sent to pharmacy   

## 2015-10-17 NOTE — Telephone Encounter (Signed)
Patient walked into the office to have labs drawn (BMET).  Asked to see Dr. Nicki Reaper due to having palpitations all night and yesterday.  Patient was seen last week.  Was restarted on her Magnesium after that appt.  She felt fine initially, but started having the palpitations so she stopped the Mg.  Checked her vitals, 132/62 Manual BP on the Right, Heart rate from 90-95 and Oxygen saturations in the 96% range.  No chest pain, no headaches, had increased urination and diarrhea last night which made her get up 6-7 times over the night.  Palpitations occurred the entire time.  Did a ECG , printed for provider to review.  Patient waiting in exam room for provider to see. Please advise?

## 2015-10-17 NOTE — Progress Notes (Signed)
Order placed for f/u potassium check.  

## 2015-10-17 NOTE — Progress Notes (Signed)
Pre-visit discussion using our clinic review tool. No additional management support is needed unless otherwise documented below in the visit note.  

## 2015-10-22 ENCOUNTER — Encounter: Payer: Self-pay | Admitting: Internal Medicine

## 2015-10-22 NOTE — Assessment & Plan Note (Signed)
Improved when on potassium and magnesium supplements.  Once potassium replaced, potassium supplements stopped.  Had increased diarrhea yesterday.  Off magnesium now.  Check metabolic panel.  Replace potassium if low.  Palpitations have subsided now.  EKG revealed SR with no acute ischemic changes.  If persistent or if potassium wnl, refer back to cardiology for further evaluation.  Pt comfortable with this plan.  Follow.

## 2015-10-22 NOTE — Assessment & Plan Note (Signed)
Seeing GI 

## 2015-10-22 NOTE — Assessment & Plan Note (Signed)
Recent tsh wnl.  

## 2015-10-29 ENCOUNTER — Other Ambulatory Visit (INDEPENDENT_AMBULATORY_CARE_PROVIDER_SITE_OTHER): Payer: PPO

## 2015-10-29 DIAGNOSIS — E876 Hypokalemia: Secondary | ICD-10-CM | POA: Diagnosis not present

## 2015-10-29 LAB — POTASSIUM: POTASSIUM: 3.6 meq/L (ref 3.5–5.1)

## 2015-10-30 ENCOUNTER — Other Ambulatory Visit: Payer: Self-pay | Admitting: Internal Medicine

## 2015-10-30 DIAGNOSIS — E876 Hypokalemia: Secondary | ICD-10-CM

## 2015-10-30 DIAGNOSIS — R002 Palpitations: Secondary | ICD-10-CM

## 2015-10-30 NOTE — Progress Notes (Signed)
Order placed for f/u labs.  

## 2015-11-12 ENCOUNTER — Other Ambulatory Visit (INDEPENDENT_AMBULATORY_CARE_PROVIDER_SITE_OTHER): Payer: PPO

## 2015-11-12 ENCOUNTER — Other Ambulatory Visit: Payer: Self-pay | Admitting: Internal Medicine

## 2015-11-12 DIAGNOSIS — R002 Palpitations: Secondary | ICD-10-CM

## 2015-11-12 DIAGNOSIS — E876 Hypokalemia: Secondary | ICD-10-CM | POA: Diagnosis not present

## 2015-11-12 LAB — MAGNESIUM: Magnesium: 2 mg/dL (ref 1.5–2.5)

## 2015-11-12 LAB — POTASSIUM: Potassium: 3.8 mEq/L (ref 3.5–5.1)

## 2015-11-12 NOTE — Progress Notes (Signed)
Order placed for f/u potassium check.  

## 2015-11-13 ENCOUNTER — Encounter: Payer: Self-pay | Admitting: *Deleted

## 2015-11-14 ENCOUNTER — Other Ambulatory Visit: Payer: Self-pay

## 2015-11-20 ENCOUNTER — Encounter: Payer: Self-pay | Admitting: Family Medicine

## 2015-11-20 ENCOUNTER — Ambulatory Visit (INDEPENDENT_AMBULATORY_CARE_PROVIDER_SITE_OTHER): Payer: PPO | Admitting: Family Medicine

## 2015-11-20 VITALS — BP 130/68 | HR 80 | Temp 97.9°F | Ht 64.5 in | Wt 148.4 lb

## 2015-11-20 DIAGNOSIS — R002 Palpitations: Secondary | ICD-10-CM | POA: Diagnosis not present

## 2015-11-20 LAB — BASIC METABOLIC PANEL
BUN: 21 mg/dL (ref 6–23)
CHLORIDE: 107 meq/L (ref 96–112)
CO2: 26 mEq/L (ref 19–32)
Calcium: 9.6 mg/dL (ref 8.4–10.5)
Creatinine, Ser: 0.99 mg/dL (ref 0.40–1.20)
GFR: 59.64 mL/min — ABNORMAL LOW (ref >60.00)
GLUCOSE: 73 mg/dL (ref 70–99)
POTASSIUM: 4.2 meq/L (ref 3.5–5.1)
Sodium: 143 mEq/L (ref 135–145)

## 2015-11-20 LAB — MAGNESIUM: Magnesium: 1.9 mg/dL (ref 1.5–2.5)

## 2015-11-20 MED ORDER — METOPROLOL SUCCINATE ER 25 MG PO TB24: 12.5000 mg | ORAL_TABLET | Freq: Every day | ORAL | Status: DC

## 2015-11-20 NOTE — Progress Notes (Signed)
Pre visit review using our clinic review tool, if applicable. No additional management support is needed unless otherwise documented below in the visit note. 

## 2015-11-20 NOTE — Assessment & Plan Note (Signed)
Established problem, worsening. Normal exam today. Did not perform EKG as she is not currently having symptoms. Repeating lab studies today for elective light imbalance. Starting on low-dose metoprolol.  If persists, will need to return to cardiology for Holter.

## 2015-11-20 NOTE — Patient Instructions (Signed)
Take the medication once daily (1/2 tablet).  Follow up if these persist.  Take care  Dr. Lacinda Axon

## 2015-11-20 NOTE — Progress Notes (Signed)
Pre-visit discussion using our clinic review tool. No additional management support is needed unless otherwise documented below in the visit note.  

## 2015-11-20 NOTE — Progress Notes (Signed)
Subjective:  Patient ID: Debra Hale, female    DOB: 1951-04-27  Age: 65 y.o. MRN: [id number]  CC: Palpitations  HPI:  65 year old female presents with complaints of palpitations.  Patient has a history of palpitations. She has previously been evaluated by cardiology. See cardiology note. Patient states that for the past week she's had worsening of her palpitations. She states that they're occurring several times throughout the day and are quite troublesome for her. She reports associated anxiety. She states that her heart rate is increased. No reports of fluttering or abnormal or skipped beats. She denies any associated chest pain, dizziness, shortness of breath, lightheadedness, presyncope. No no relieving factors. Possibly exacerbated by caffeine and stress. No other complaints today.  Social Hx   Social History   Social History  . Marital Status: Divorced    Spouse Name: N/A  . Number of Children: 0  . Years of Education: N/A   Social History Main Topics  . Smoking status: Never Smoker   . Smokeless tobacco: Never Used  . Alcohol Use: No  . Drug Use: No  . Sexual Activity: Not Asked   Other Topics Concern  . None   Social History Narrative   Review of Systems  Respiratory: Negative for shortness of breath.   Cardiovascular: Positive for palpitations. Negative for chest pain.   Objective:  BP 130/68 mmHg  Pulse 80  Temp(Src) 97.9 F (36.6 C) (Oral)  Ht 5' 4.5" (1.638 m)  Wt 148 lb 6.4 oz (67.314 kg)  BMI 25.09 kg/m2  SpO2 98%  BP/Weight 11/20/2015 [id number]  Systolic BP [id number]  Diastolic BP 68 62 84  Wt. (Lbs) 148.4 - 147  BMI 25.09 - 24.85    Physical Exam  Constitutional: She is oriented to person, place, and time. She appears well-developed. No distress.  Cardiovascular: Normal rate and regular rhythm.   Pulmonary/Chest: Effort normal. She has no wheezes. She has no rales.  Neurological: She is alert and oriented to person, place, and  time.  Psychiatric: She has a normal mood and affect.  Vitals reviewed.   Lab Results  Component Value Date   WBC 4.4 12/11/2014   HGB 14.5 12/11/2014   HCT 41.8 12/11/2014   PLT 191 12/11/2014   GLUCOSE 143* 10/17/2015   CHOL 211* 07/11/2015   TRIG 205.0* 07/11/2015   HDL 45.70 07/11/2015   LDLDIRECT 126.0 07/11/2015   LDLCALC 125* 03/19/2015   ALT 62* 07/11/2015   AST 22 07/11/2015   NA 142 10/17/2015   K 3.8 11/12/2015   CL 105 10/17/2015   CREATININE 1.03 10/17/2015   BUN 18 10/17/2015   CO2 29 10/17/2015   TSH 1.33 07/11/2015   HGBA1C 5.2 08/30/2013    Assessment & Plan:   Problem List Items Addressed This Visit    Palpitations - Primary    Established problem, worsening. Normal exam today. Did not perform EKG as she is not currently having symptoms. Repeating lab studies today for elective light imbalance. Starting on low-dose metoprolol.  If persists, will need to return to cardiology for Holter.      Relevant Orders   Basic Metabolic Panel (BMET)   Magnesium      Meds ordered this encounter  Medications  . metoprolol succinate (TOPROL XL) 25 MG 24 hr tablet    Sig: Take 0.5 tablets (12.5 mg total) by mouth daily.    Dispense:  90 tablet    Refill:  0   Follow-up: PRN  [place]

## 2015-12-04 ENCOUNTER — Other Ambulatory Visit: Payer: Self-pay

## 2015-12-10 ENCOUNTER — Encounter (INDEPENDENT_AMBULATORY_CARE_PROVIDER_SITE_OTHER): Payer: Self-pay

## 2015-12-10 ENCOUNTER — Other Ambulatory Visit (INDEPENDENT_AMBULATORY_CARE_PROVIDER_SITE_OTHER): Payer: PPO

## 2015-12-10 DIAGNOSIS — E876 Hypokalemia: Secondary | ICD-10-CM | POA: Diagnosis not present

## 2015-12-10 LAB — POTASSIUM: POTASSIUM: 3.3 meq/L — AB (ref 3.5–5.1)

## 2015-12-11 ENCOUNTER — Other Ambulatory Visit: Payer: PPO

## 2015-12-11 ENCOUNTER — Ambulatory Visit: Payer: PPO | Admitting: Internal Medicine

## 2015-12-11 ENCOUNTER — Other Ambulatory Visit: Payer: Self-pay | Admitting: *Deleted

## 2015-12-11 MED ORDER — POTASSIUM CHLORIDE ER 10 MEQ PO TBCR: 10.0000 meq | EXTENDED_RELEASE_TABLET | Freq: Every day | ORAL | 2 refills | Status: DC

## 2015-12-11 NOTE — Telephone Encounter (Signed)
Sent in Potassium refill

## 2015-12-24 DIAGNOSIS — H6501 Acute serous otitis media, right ear: Secondary | ICD-10-CM | POA: Diagnosis not present

## 2015-12-24 DIAGNOSIS — J069 Acute upper respiratory infection, unspecified: Secondary | ICD-10-CM | POA: Diagnosis not present

## 2015-12-26 ENCOUNTER — Other Ambulatory Visit: Payer: Self-pay

## 2015-12-31 ENCOUNTER — Ambulatory Visit (INDEPENDENT_AMBULATORY_CARE_PROVIDER_SITE_OTHER): Payer: PPO | Admitting: Internal Medicine

## 2015-12-31 ENCOUNTER — Encounter: Payer: Self-pay | Admitting: Internal Medicine

## 2015-12-31 VITALS — BP 116/74 | HR 88 | Temp 97.4°F | Resp 16 | Wt 143.0 lb

## 2015-12-31 DIAGNOSIS — C73 Malignant neoplasm of thyroid gland: Secondary | ICD-10-CM

## 2015-12-31 DIAGNOSIS — R7989 Other specified abnormal findings of blood chemistry: Secondary | ICD-10-CM | POA: Diagnosis not present

## 2015-12-31 DIAGNOSIS — G6289 Other specified polyneuropathies: Secondary | ICD-10-CM

## 2015-12-31 DIAGNOSIS — F419 Anxiety disorder, unspecified: Secondary | ICD-10-CM

## 2015-12-31 DIAGNOSIS — R221 Localized swelling, mass and lump, neck: Secondary | ICD-10-CM

## 2015-12-31 DIAGNOSIS — F439 Reaction to severe stress, unspecified: Secondary | ICD-10-CM

## 2015-12-31 DIAGNOSIS — R195 Other fecal abnormalities: Secondary | ICD-10-CM

## 2015-12-31 DIAGNOSIS — R002 Palpitations: Secondary | ICD-10-CM | POA: Diagnosis not present

## 2015-12-31 DIAGNOSIS — C50911 Malignant neoplasm of unspecified site of right female breast: Secondary | ICD-10-CM

## 2015-12-31 DIAGNOSIS — E559 Vitamin D deficiency, unspecified: Secondary | ICD-10-CM

## 2015-12-31 DIAGNOSIS — Z658 Other specified problems related to psychosocial circumstances: Secondary | ICD-10-CM

## 2015-12-31 DIAGNOSIS — K219 Gastro-esophageal reflux disease without esophagitis: Secondary | ICD-10-CM

## 2015-12-31 DIAGNOSIS — R945 Abnormal results of liver function studies: Secondary | ICD-10-CM

## 2015-12-31 DIAGNOSIS — E7800 Pure hypercholesterolemia, unspecified: Secondary | ICD-10-CM

## 2015-12-31 DIAGNOSIS — R790 Abnormal level of blood mineral: Secondary | ICD-10-CM

## 2015-12-31 DIAGNOSIS — Z85038 Personal history of other malignant neoplasm of large intestine: Secondary | ICD-10-CM

## 2015-12-31 LAB — BASIC METABOLIC PANEL
BUN: 18 mg/dL (ref 6–23)
CALCIUM: 9.2 mg/dL (ref 8.4–10.5)
CHLORIDE: 108 meq/L (ref 96–112)
CO2: 29 mEq/L (ref 19–32)
CREATININE: 0.96 mg/dL (ref 0.40–1.20)
GFR: 61.78 mL/min (ref >60.00)
Glucose, Bld: 84 mg/dL (ref 70–99)
Potassium: 3.8 mEq/L (ref 3.5–5.1)
Sodium: 144 mEq/L (ref 135–145)

## 2015-12-31 LAB — HEPATIC FUNCTION PANEL
ALT: 48 U/L — ABNORMAL HIGH (ref 0–35)
AST: 17 U/L (ref 0–37)
Albumin: 4.1 g/dL (ref 3.5–5.2)
Alkaline Phosphatase: 94 U/L (ref 39–117)
Bilirubin, Direct: 0 mg/dL (ref 0.0–0.3)
Total Bilirubin: 0.4 mg/dL (ref 0.2–1.2)
Total Protein: 7.2 g/dL (ref 6.0–8.3)

## 2015-12-31 LAB — MAGNESIUM: Magnesium: 1.6 mg/dL (ref 1.5–2.5)

## 2015-12-31 NOTE — Progress Notes (Signed)
Patient ID: Debra Hale, female   DOB: 29-Dec-1950, 65 y.o.   MRN: [id number]   Subjective:    Patient ID: Debra Hale, female    DOB: 1950-07-04, 66 y.o.   MRN: [id number]  HPI  Patient here for a scheduled follow up.  She is still having issues with heart palpitations.  wakes up with fluttering.  She is back on potassium.  Still noticing the palpitations.  No chest pain.  Breathing stable.  Previously saw cardiology.  Felt initially to be related to electrolyte abnormalities.  Still having issues.  No increased acid reflux.  No abdominal pain.  Bowels stable.  Takes clonazepam.  Helps.  She was on amoxicillin.  Stopped.  No infectious symptoms.     Past Medical History:  Diagnosis Date  . Anxiety   . Breast cancer ([place]) 2009   right breast lumpectomy with rad tx  . Colon cancer ([place])    surgery with chemo and rad tx  . GERD (gastroesophageal reflux disease)   . Liver nodule    s/p negative biopsy  . Malignant neoplasm of thyroid gland ([place]) 2002   s/p surgery and XRT  . Nephrolithiasis    s/p lithotripsy  . Osteoporosis   . Other and unspecified hyperlipidemia   . Palpitations   . Personal history of malignant neoplasm of large intestine    carcinoma - cecum, s/p right laparoscopic colectomy - s/p chemotherapy and XRT  . Unspecified hereditary and idiopathic peripheral neuropathy    Past Surgical History:  Procedure Laterality Date  . APPENDECTOMY  1985  . BREAST BIOPSY Right 2009   positive  . BREAST BIOPSY Right 2009   negative  . BREAST LUMPECTOMY  2009   breast cancer  . CHOLECYSTECTOMY  1995  . DILATION AND CURETTAGE OF UTERUS  1990  . LAPAROSCOPIC PARTIAL COLECTOMY     stage 3-C carcinoma of the cecum, s/p chemotherapy and xrt  . LITHOTRIPSY    . THYROID LOBECTOMY  2002   s/p XRT   Family History  Problem Relation Age of Onset  . Stroke Mother     20s  . Lung cancer Father   . Prostate cancer Father   . Breast cancer Sister     34's  . Lung cancer  Sister   . Breast cancer Maternal Aunt    Social History   Social History  . Marital status: Divorced    Spouse name: N/A  . Number of children: 0  . Years of education: N/A   Social History Main Topics  . Smoking status: Never Smoker  . Smokeless tobacco: Never Used  . Alcohol use No  . Drug use: No  . Sexual activity: Not Asked   Other Topics Concern  . None   Social History Narrative  . None    Outpatient Encounter Prescriptions as of 12/31/2015  Medication Sig  . Azelastine-Fluticasone 137-50 MCG/ACT SUSP Two spray each nostril once a day  . bismuth subsalicylate (PEPTO-BISMOL) 262 MG/15ML suspension Take 15 mLs by mouth every 6 (six) hours as needed. Reported on 10/01/2015  . clonazePAM (KLONOPIN) 1 MG tablet Take 1 mg by mouth 2 (two) times daily.   Marland Kitchen escitalopram (LEXAPRO) 20 MG tablet Take 20 mg by mouth daily.    Marland Kitchen levothyroxine (SYNTHROID, LEVOTHROID) 88 MCG tablet TAKE 1 TABLET (88 MCG TOTAL) BY MOUTH DAILY BEFORE BREAKFAST.  Marland Kitchen loperamide (IMODIUM) 1 MG/5ML solution Take 1 mg by mouth as needed for diarrhea or loose stools.  Marland Kitchen  MAGNESIUM PO Take by mouth daily. Reported on 11/20/2015  . pantoprazole (PROTONIX) 40 MG tablet TAKE 1 TABLET BY MOUTH  DAILY.  Marland Kitchen potassium chloride (K-DUR) 10 MEQ tablet Take 1 tablet (10 mEq total) by mouth daily.  . [DISCONTINUED] magnesium oxide (MAG-OX) 400 MG tablet Take 1 tablet (400 mg total) by mouth daily.  . ergocalciferol (VITAMIN D2) 50000 units capsule Take 1 capsule (50,000 Units total) by mouth once a week.  . magnesium oxide (MAG-OX) 400 MG tablet Take 1 tablet (400 mg total) by mouth daily.  . metoprolol succinate (TOPROL XL) 25 MG 24 hr tablet Take 0.5 tablets (12.5 mg total) by mouth daily.   No facility-administered encounter medications on file as of 12/31/2015.     Review of Systems  Constitutional: Negative for appetite change and unexpected weight change.  HENT: Negative for congestion and sinus pressure.     Respiratory: Negative for cough, chest tightness and shortness of breath.   Cardiovascular: Positive for palpitations. Negative for chest pain and leg swelling.  Gastrointestinal: Negative for abdominal pain, diarrhea, nausea and vomiting.  Genitourinary: Negative for difficulty urinating and dysuria.  Musculoskeletal: Negative for back pain and joint swelling.  Skin: Negative for color change and rash.  Neurological: Negative for dizziness, light-headedness and headaches.  Psychiatric/Behavioral: Negative for agitation and dysphoric mood.       Objective:    Physical Exam  Constitutional: She appears well-developed and well-nourished. No distress.  HENT:  Nose: Nose normal.  Mouth/Throat: Oropharynx is clear and moist.  Neck: Neck supple. No thyromegaly present.  Cardiovascular: Normal rate and regular rhythm.   Pulmonary/Chest: Breath sounds normal. No respiratory distress. She has no wheezes.  Abdominal: Soft. Bowel sounds are normal. There is no tenderness.  Musculoskeletal: She exhibits no edema or tenderness.  Lymphadenopathy:    She has no cervical adenopathy.  Skin: No rash noted. No erythema.  Psychiatric: She has a normal mood and affect. Her behavior is normal.    BP 116/74 (BP Location: Left Arm, Patient Position: Sitting, Cuff Size: Normal)   Pulse 88   Temp 97.4 F (36.3 C) (Oral)   Resp 16   Wt 143 lb (64.9 kg)   SpO2 96%   BMI 24.17 kg/m  Wt Readings from Last 3 Encounters:  01/02/16 144 lb 6.4 oz (65.5 kg)  12/31/15 143 lb (64.9 kg)  11/20/15 148 lb 6.4 oz (67.3 kg)     Lab Results  Component Value Date   WBC 4.7 01/02/2016   HGB 14.2 01/02/2016   HCT 40.3 01/02/2016   PLT 229 01/02/2016   GLUCOSE 109 (H) 01/02/2016   CHOL 211 (H) 07/11/2015   TRIG 205.0 (H) 07/11/2015   HDL 45.70 07/11/2015   LDLDIRECT 126.0 07/11/2015   LDLCALC 125 (H) 03/19/2015   ALT 43 01/02/2016   AST 22 01/02/2016   NA 141 01/02/2016   K 2.9 (L) 01/02/2016   CL  104 01/02/2016   CREATININE 1.05 (H) 01/02/2016   BUN 17 01/02/2016   CO2 25 01/02/2016   TSH 1.33 07/11/2015   HGBA1C 5.2 08/30/2013    Mm Screening Breast Tomo Bilateral  Result Date: 10/08/2015 CLINICAL DATA:  Screening. EXAM: 2D DIGITAL SCREENING BILATERAL MAMMOGRAM WITH CAD AND ADJUNCT TOMO COMPARISON:  Previous exam(s). ACR Breast Density Category c: The breast tissue is heterogeneously dense, which may obscure small masses. FINDINGS: There are no findings suspicious for malignancy. Stable benign postsurgical changes on the right. Images were processed with CAD. IMPRESSION: No  mammographic evidence of malignancy. A result letter of this screening mammogram will be mailed directly to the patient. RECOMMENDATION: Screening mammogram in one year. (Code:SM-B-01Y) BI-RADS CATEGORY  2: Benign. Electronically Signed   By: Lajean Manes M.D.   On: 10/08/2015 12:28       Assessment & Plan:   Problem List Items Addressed This Visit    Abnormal liver function test    Worked up by GI.  Follow liver function tests.        Relevant Orders   Hepatic function panel (Completed)   Anxiety    Followed by Dr Clovis Riley.  On lexapro.  Stable.        Breast cancer, right ([place])    Mammogram 10/08/15 - birads II.        CARCINOMA, THYROID GLAND, PAPILLARY    Follow tsh.       GERD (gastroesophageal reflux disease)    Controlled on protonix.        Relevant Medications   magnesium oxide (MAG-OX) 400 MG tablet   History of malignant neoplasm of large intestine    Colonoscopy 08/02/14 as outlined.  Recommended f/u colonoscopy in 07/2017.        Hypercholesterolemia    Low cholesterol diet and exercise.  Follow lipid panel.        Loose stools   Relevant Orders   CBC with Differential/Platelet   Neck nodule    Resolved.        Palpitations - Primary    Persistent.  On potassium supplements.  Will recheck potassium and magnesium today.  Refer back to cardiology.        Relevant Orders    Basic metabolic panel (Completed)   Magnesium (Completed)   Ambulatory referral to Cardiology   Peripheral neuropathy ([place])    Noticed since her chemo treatment.  Follow.        Stress    On lexapro.  Stable.        Vitamin D deficiency    Continue vitamin D supplements.         Other Visit Diagnoses    Low magnesium levels       Relevant Medications   magnesium oxide (MAG-OX) 400 MG tablet       [name], MD

## 2016-01-01 ENCOUNTER — Other Ambulatory Visit: Payer: Self-pay | Admitting: Internal Medicine

## 2016-01-01 ENCOUNTER — Other Ambulatory Visit: Payer: PPO

## 2016-01-01 ENCOUNTER — Telehealth: Payer: Self-pay

## 2016-01-01 ENCOUNTER — Other Ambulatory Visit: Payer: Self-pay | Admitting: *Deleted

## 2016-01-01 ENCOUNTER — Ambulatory Visit: Payer: PPO

## 2016-01-01 DIAGNOSIS — Z85038 Personal history of other malignant neoplasm of large intestine: Secondary | ICD-10-CM

## 2016-01-01 DIAGNOSIS — R195 Other fecal abnormalities: Secondary | ICD-10-CM

## 2016-01-01 DIAGNOSIS — Z853 Personal history of malignant neoplasm of breast: Secondary | ICD-10-CM

## 2016-01-01 DIAGNOSIS — E876 Hypokalemia: Secondary | ICD-10-CM

## 2016-01-01 MED ORDER — MAGNESIUM OXIDE 400 MG PO TABS: 400.0000 mg | ORAL_TABLET | Freq: Every day | ORAL | 3 refills | Status: DC

## 2016-01-01 NOTE — Progress Notes (Signed)
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  INTERVAL HISTORY:  Patient returns to clinic today for routine yearly follow-up. She has multiple complaints related to her recent sinus section as well as a flareup of her "IBS". She otherwise feels well. She has no neurologic complaints. She denies any fevers. She has no chest pain or shortness of breath. She denies any nausea or vomiting. She has no urinary complaints. Patient otherwise feels well and offers no further specific complaints.  REVIEW OF SYSTEMS:   Review of Systems  Constitutional: Positive for malaise/fatigue. Negative for fever and weight loss.  HENT: Positive for ear pain and sore throat.   Respiratory: Negative.  Negative for cough and shortness of breath.   Cardiovascular: Negative.  Negative for chest pain.  Gastrointestinal: Positive for abdominal pain and diarrhea.  Genitourinary: Negative.   Musculoskeletal: Negative.   Neurological: Positive for weakness.  Psychiatric/Behavioral: The patient is nervous/anxious.     As per HPI. Otherwise, a complete review of systems is negatve.  PAST MEDICAL HISTORY: Past Medical History:  Diagnosis Date  . Anxiety   . Breast cancer ([place]) 2009   right breast lumpectomy with rad tx  . Colon cancer ([place])    surgery with chemo and rad tx  . GERD (gastroesophageal reflux disease)   . Liver nodule    s/p negative biopsy  . Malignant neoplasm of thyroid gland ([place]) 2002   s/p surgery and XRT  . Nephrolithiasis    s/p lithotripsy  . Osteoporosis   . Other and unspecified hyperlipidemia   . Palpitations   . Personal history of malignant neoplasm of large intestine    carcinoma - cecum, s/p right  laparoscopic colectomy - s/p chemotherapy and XRT  . Unspecified hereditary and idiopathic peripheral neuropathy     PAST SURGICAL HISTORY: Past Surgical History:  Procedure Laterality Date  . APPENDECTOMY  1985  . BREAST BIOPSY Right 2009   positive  . BREAST BIOPSY Right 2009   negative  . BREAST LUMPECTOMY  2009   breast cancer  . CHOLECYSTECTOMY  1995  . DILATION AND CURETTAGE OF UTERUS  1990  . LAPAROSCOPIC PARTIAL COLECTOMY     stage 3-C carcinoma of the cecum, s/p chemotherapy and xrt  . LITHOTRIPSY    . THYROID LOBECTOMY  2002   s/p XRT    FAMILY HISTORY: Family History  Problem Relation Age of Onset  . Stroke Mother     73s  . Lung cancer Father   . Prostate cancer Father   . Breast cancer Sister     14's  . Lung cancer Sister   . Breast cancer Maternal Aunt        ADVANCED DIRECTIVES (Y/N):  N   HEALTH MAINTENANCE: Social History  Substance Use Topics  . Smoking status: Never Smoker  . Smokeless tobacco: Never Used  . Alcohol use No     Colonoscopy:  PAP:  Bone density:  Lipid panel:  Allergies  Allergen Reactions  . Augmentin [Amoxicillin-Pot Clavulanate] Diarrhea  . Bentyl [Dicyclomine Hcl]   . Ciprofloxacin Diarrhea  . Codeine Other (See Comments)    dizziness    . Epinephrine   . Flagyl [Metronidazole]   . Librax [Chlordiazepoxide-Clidinium]   . Phenobarbital   .  Tetracyclines & Related Other (See Comments)    Throat swells   . Ultram [Tramadol] Other (See Comments)    Sick feeling    Current Outpatient Prescriptions  Medication Sig Dispense Refill  . Azelastine-Fluticasone 137-50 MCG/ACT SUSP Two spray each nostril once a day 23 g 3  . bismuth subsalicylate (PEPTO-BISMOL) 262 MG/15ML suspension Take 15 mLs by mouth every 6 (six) hours as needed. Reported on 10/01/2015    . clonazePAM (KLONOPIN) 1 MG tablet Take 1 mg by mouth 2 (two) times daily.     . ergocalciferol (VITAMIN D2) 50000 units capsule Take 1 capsule (50,000  Units total) by mouth once a week. 4 capsule 3  . escitalopram (LEXAPRO) 20 MG tablet Take 20 mg by mouth daily.      Marland Kitchen levothyroxine (SYNTHROID, LEVOTHROID) 88 MCG tablet TAKE 1 TABLET (88 MCG TOTAL) BY MOUTH DAILY BEFORE BREAKFAST. 90 tablet 2  . loperamide (IMODIUM) 1 MG/5ML solution Take 1 mg by mouth as needed for diarrhea or loose stools.    . magnesium oxide (MAG-OX) 400 MG tablet Take 1 tablet (400 mg total) by mouth daily. 30 tablet 3  . MAGNESIUM PO Take by mouth daily. Reported on 11/20/2015    . metoprolol succinate (TOPROL XL) 25 MG 24 hr tablet Take 0.5 tablets (12.5 mg total) by mouth daily. 90 tablet 0  . pantoprazole (PROTONIX) 40 MG tablet TAKE 1 TABLET BY MOUTH  DAILY. 90 tablet 2  . potassium chloride (K-DUR) 10 MEQ tablet Take 1 tablet (10 mEq total) by mouth daily. 30 tablet 2   No current facility-administered medications for this visit.     OBJECTIVE: Vitals:   01/02/16 1204  BP: 129/77  Pulse: 96  Resp: 18  Temp: 97.3 F (36.3 C)     Body mass index is 24.4 kg/m.    ECOG FS:0 - Asymptomatic  General: Well-developed, well-nourished, no acute distress. Eyes: Pink conjunctiva, anicteric sclera. HEENT: Normocephalic, moist mucous membranes, clear oropharnyx. Breasts: Patient refused breast exam today. Lungs: Clear to auscultation bilaterally. Heart: Regular rate and rhythm. No rubs, murmurs, or gallops. Abdomen: Soft, nontender, nondistended. No organomegaly noted, normoactive bowel sounds. Musculoskeletal: No edema, cyanosis, or clubbing. Neuro: Alert, answering all questions appropriately. Cranial nerves grossly intact. Skin: No rashes or petechiae noted. Psych: Normal affect.   LAB RESULTS:  Lab Results  Component Value Date   NA 141 01/02/2016   K 2.9 (L) 01/02/2016   CL 104 01/02/2016   CO2 25 01/02/2016   GLUCOSE 109 (H) 01/02/2016   BUN 17 01/02/2016   CREATININE 1.05 (H) 01/02/2016   CALCIUM 8.4 (L) 01/02/2016   PROT 7.3 01/02/2016    ALBUMIN 4.0 01/02/2016   AST 22 01/02/2016   ALT 43 01/02/2016   ALKPHOS 94 01/02/2016   BILITOT 0.6 01/02/2016   GFRNONAA 54 (L) 01/02/2016   GFRAA >60 01/02/2016    Lab Results  Component Value Date   WBC 4.7 01/02/2016   NEUTROABS 3.7 01/02/2016   HGB 14.2 01/02/2016   HCT 40.3 01/02/2016   MCV 85.1 01/02/2016   PLT 229 01/02/2016     STUDIES: No results found.  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  PLAN:    1. 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 Patient completed 5 years of hormonal therapy in approximately June 2015.  Currently, she has no evidence of disease. Her most recent mammogram on October 08, 2015 was reported as BI-RADS 2. Repeat in June 2018. Return to clinic in 1 year for routine evaluation. 2. IBS: Continued monitoring and treatment per GI.  Patient expressed understanding and was in agreement with this plan. She also understands that She can call clinic at any time with any questions, concerns, or complaints.   Breast cancer, right [facility])   Staging form: Breast, AJCC 7th Edition   - Clinical stage from 01/07/2016: Stage IA (T1c, N0, M0) - Signed by Lloyd Huger, MD on 01/07/2016  Lloyd Huger, MD   01/07/2016 11:31 PM

## 2016-01-01 NOTE — Telephone Encounter (Signed)
-----   Message from Bevelyn Ngo, RN sent at 01/01/2016  8:01 AM EDT -----   ----- Message ----- From: [name], MD Sent: 01/01/2016   5:35 AM To: Leeanne Rio, CMA  Notify pt that her liver function tests have improved.  One liver test just slightly increased.  Magnesium level is low end of normal range.  Would like for her to restart magoxide 400mg  q day.  Follow and see if diarrhea worsens on magnesium.  Potassium wnl.  Continue daily potassium as she is doing.  We will follow.  Recheck potassium and magnesium in 2-3 weeks.  Schedule non fasting lab appt.

## 2016-01-01 NOTE — Telephone Encounter (Signed)
Advised pt of lab results. Pt verbally acknowledges understanding. Lab appt scheduled. Renaldo Fiddler, CMA

## 2016-01-01 NOTE — Telephone Encounter (Signed)
[facility] called saying the lavender tube sent for a CBC yesterday clotted so they were not able to run the CBC. Do you want patient to come in for re-draw or just do it when she comes for to recheck potassium and magnesium in 4 weeks?

## 2016-01-02 ENCOUNTER — Inpatient Hospital Stay (HOSPITAL_BASED_OUTPATIENT_CLINIC_OR_DEPARTMENT_OTHER): Payer: PPO | Admitting: Oncology

## 2016-01-02 ENCOUNTER — Inpatient Hospital Stay: Payer: PPO | Attending: Oncology

## 2016-01-02 VITALS — BP 129/77 | HR 96 | Temp 97.3°F | Resp 18 | Wt 144.4 lb

## 2016-01-02 DIAGNOSIS — Z923 Personal history of irradiation: Secondary | ICD-10-CM | POA: Diagnosis not present

## 2016-01-02 DIAGNOSIS — K219 Gastro-esophageal reflux disease without esophagitis: Secondary | ICD-10-CM | POA: Insufficient documentation

## 2016-01-02 DIAGNOSIS — Z9221 Personal history of antineoplastic chemotherapy: Secondary | ICD-10-CM | POA: Diagnosis not present

## 2016-01-02 DIAGNOSIS — K589 Irritable bowel syndrome without diarrhea: Secondary | ICD-10-CM

## 2016-01-02 DIAGNOSIS — Z79899 Other long term (current) drug therapy: Secondary | ICD-10-CM | POA: Insufficient documentation

## 2016-01-02 DIAGNOSIS — Z170 Estrogen receptor positive status [ER+]: Secondary | ICD-10-CM

## 2016-01-02 DIAGNOSIS — Z853 Personal history of malignant neoplasm of breast: Secondary | ICD-10-CM

## 2016-01-02 DIAGNOSIS — F419 Anxiety disorder, unspecified: Secondary | ICD-10-CM | POA: Diagnosis not present

## 2016-01-02 DIAGNOSIS — Z85038 Personal history of other malignant neoplasm of large intestine: Secondary | ICD-10-CM | POA: Diagnosis not present

## 2016-01-02 DIAGNOSIS — M818 Other osteoporosis without current pathological fracture: Secondary | ICD-10-CM | POA: Diagnosis not present

## 2016-01-02 DIAGNOSIS — E785 Hyperlipidemia, unspecified: Secondary | ICD-10-CM | POA: Insufficient documentation

## 2016-01-02 DIAGNOSIS — C50911 Malignant neoplasm of unspecified site of right female breast: Secondary | ICD-10-CM

## 2016-01-02 LAB — CBC WITH DIFFERENTIAL/PLATELET
Basophils Absolute: 0 10*3/uL (ref 0–0.1)
Basophils Relative: 1 %
Eosinophils Absolute: 0.1 10*3/uL (ref 0–0.7)
Eosinophils Relative: 1 %
HEMATOCRIT: 40.3 % (ref 35.0–47.0)
Hemoglobin: 14.2 g/dL (ref 12.0–16.0)
LYMPHS ABS: 0.7 10*3/uL — AB (ref 1.0–3.6)
LYMPHS PCT: 15 %
MCH: 30 pg (ref 26.0–34.0)
MCHC: 35.3 g/dL (ref 32.0–36.0)
MCV: 85.1 fL (ref 80.0–100.0)
MONO ABS: 0.3 10*3/uL (ref 0.2–0.9)
MONOS PCT: 6 %
NEUTROS ABS: 3.7 10*3/uL (ref 1.4–6.5)
Neutrophils Relative %: 77 %
Platelets: 229 10*3/uL (ref 150–440)
RBC: 4.73 MIL/uL (ref 3.80–5.20)
RDW: 13.6 % (ref 11.5–14.5)
WBC: 4.7 10*3/uL (ref 3.6–11.0)

## 2016-01-02 LAB — COMPREHENSIVE METABOLIC PANEL
ALBUMIN: 4 g/dL (ref 3.5–5.0)
ALK PHOS: 94 U/L (ref 38–126)
ALT: 43 U/L (ref 14–54)
AST: 22 U/L (ref 15–41)
Anion gap: 12 (ref 5–15)
BUN: 17 mg/dL (ref 6–20)
CALCIUM: 8.4 mg/dL — AB (ref 8.9–10.3)
CO2: 25 mmol/L (ref 22–32)
CREATININE: 1.05 mg/dL — AB (ref 0.44–1.00)
Chloride: 104 mmol/L (ref 101–111)
GFR calc Af Amer: >60 mL/min (ref >60)
GFR calc non Af Amer: 54 mL/min — ABNORMAL LOW (ref >60)
GLUCOSE: 109 mg/dL — AB (ref 65–99)
Potassium: 2.9 mmol/L — ABNORMAL LOW (ref 3.5–5.1)
SODIUM: 141 mmol/L (ref 135–145)
Total Bilirubin: 0.6 mg/dL (ref 0.3–1.2)
Total Protein: 7.3 g/dL (ref 6.5–8.1)

## 2016-01-02 NOTE — Telephone Encounter (Signed)
Can schedule with next labs.

## 2016-01-02 NOTE — Telephone Encounter (Signed)
Noted! Thank you

## 2016-01-02 NOTE — Progress Notes (Signed)
Complains of swollen lymph nodes in neck. Was being treated for sinus infection with antibiotics but had to stop due to flare-up of IBS.

## 2016-01-03 LAB — CANCER ANTIGEN 27.29: CA 27.29: 22.1 U/mL (ref 0.0–38.6)

## 2016-01-03 LAB — CEA: CEA: 2.8 ng/mL (ref 0.0–4.7)

## 2016-01-05 ENCOUNTER — Encounter: Payer: Self-pay | Admitting: Internal Medicine

## 2016-01-05 NOTE — Assessment & Plan Note (Signed)
Follow tsh.  

## 2016-01-05 NOTE — Assessment & Plan Note (Signed)
Worked up by GI.  Follow liver function tests.   

## 2016-01-05 NOTE — Assessment & Plan Note (Signed)
Resolved

## 2016-01-05 NOTE — Assessment & Plan Note (Signed)
Mammogram 10/08/15 - birads II.

## 2016-01-05 NOTE — Assessment & Plan Note (Signed)
Colonoscopy 08/02/14 as outlined.  Recommended f/u colonoscopy in 07/2017.

## 2016-01-05 NOTE — Assessment & Plan Note (Signed)
Low cholesterol diet and exercise.  Follow lipid panel.   

## 2016-01-05 NOTE — Assessment & Plan Note (Signed)
Persistent.  On potassium supplements.  Will recheck potassium and magnesium today.  Refer back to cardiology.

## 2016-01-05 NOTE — Assessment & Plan Note (Signed)
Continue vitamin D supplements.  

## 2016-01-05 NOTE — Assessment & Plan Note (Signed)
Controlled on protonix.   

## 2016-01-05 NOTE — Assessment & Plan Note (Signed)
On lexapro.  Stable.  ?

## 2016-01-05 NOTE — Assessment & Plan Note (Signed)
Followed by Dr Clovis Riley.  On lexapro.  Stable.

## 2016-01-05 NOTE — Assessment & Plan Note (Signed)
Noticed since her chemo treatment.  Follow.

## 2016-01-10 ENCOUNTER — Encounter: Payer: Self-pay | Admitting: Family Medicine

## 2016-01-10 ENCOUNTER — Other Ambulatory Visit: Payer: PPO

## 2016-01-10 ENCOUNTER — Ambulatory Visit (INDEPENDENT_AMBULATORY_CARE_PROVIDER_SITE_OTHER): Payer: PPO | Admitting: Family Medicine

## 2016-01-10 ENCOUNTER — Other Ambulatory Visit: Payer: Self-pay | Admitting: Family Medicine

## 2016-01-10 VITALS — BP 103/70 | HR 88 | Wt 141.8 lb

## 2016-01-10 DIAGNOSIS — R197 Diarrhea, unspecified: Secondary | ICD-10-CM

## 2016-01-10 LAB — COMPREHENSIVE METABOLIC PANEL
ALT: 38 U/L — AB (ref 0–35)
AST: 18 U/L (ref 0–37)
Albumin: 4.1 g/dL (ref 3.5–5.2)
Alkaline Phosphatase: 95 U/L (ref 39–117)
BILIRUBIN TOTAL: 0.6 mg/dL (ref 0.2–1.2)
BUN: 15 mg/dL (ref 6–23)
CHLORIDE: 108 meq/L (ref 96–112)
CO2: 28 meq/L (ref 19–32)
CREATININE: 1.04 mg/dL (ref 0.40–1.20)
Calcium: 9.2 mg/dL (ref 8.4–10.5)
GFR: 56.32 mL/min — ABNORMAL LOW (ref >60.00)
GLUCOSE: 103 mg/dL — AB (ref 70–99)
Potassium: 3.2 mEq/L — ABNORMAL LOW (ref 3.5–5.1)
SODIUM: 142 meq/L (ref 135–145)
Total Protein: 7.2 g/dL (ref 6.0–8.3)

## 2016-01-10 NOTE — Assessment & Plan Note (Signed)
New problem. Likely viral in origin. Advised hydration. Obtaining stool C diff and culture.

## 2016-01-10 NOTE — Patient Instructions (Addendum)
Hydrate, hydrate, hydrate.  Return with the stool sample.  If you worsen, go to Urgent care or the ED.  Take care  Dr. Lacinda Axon

## 2016-01-10 NOTE — Progress Notes (Signed)
   Subjective:  Patient ID: Debra Hale, female    DOB: 03/18/1951  Age: 66 y.o. MRN: [id number]  CC: Diarrhea  HPI:  65 year old female presents with complaints of diarrhea.  Patient states that she developed diarrhea on Tuesday. She reports that her stool essentially has no form. No associated fever. No nausea or vomiting. No recent sick contacts. She has had a recent antibiotic course (for Otitis media per EMR). She has been trying to stay hydrated. Poor food intake. No known exacerbating or relieving factors. No medication stride. No other complaints at this time.  Social Hx   Social History   Social History  . Marital status: Divorced    Spouse name: N/A  . Number of children: 0  . Years of education: N/A   Social History Main Topics  . Smoking status: Never Smoker  . Smokeless tobacco: Never Used  . Alcohol use No  . Drug use: No  . Sexual activity: Not Asked   Other Topics Concern  . None   Social History Narrative  . None    Review of Systems  Constitutional: Positive for fatigue. Negative for fever.  Gastrointestinal: Positive for diarrhea. Negative for nausea and vomiting.   Objective:  BP 103/70   Pulse 88   Wt 141 lb 12.8 oz (64.3 kg)   SpO2 95%   BMI 23.96 kg/m   BP/Weight 01/10/2016 01/02/2016 05/09/2692  Systolic BP 854 627 035  Diastolic BP 70 77 74  Wt. (Lbs) 141.8 144.4 143  BMI 23.96 24.4 24.17   Physical Exam  Constitutional: She appears well-developed. No distress.  HENT:  Slightly dry mucous membranes.   Cardiovascular: Normal rate and regular rhythm.   Pulmonary/Chest: Effort normal and breath sounds normal.  Abdominal: Soft. She exhibits no distension. There is no tenderness. There is no rebound and no guarding.  Neurological: She is alert.  Vitals reviewed.  Lab Results  Component Value Date   WBC 4.7 01/02/2016   HGB 14.2 01/02/2016   HCT 40.3 01/02/2016   PLT 229 01/02/2016   GLUCOSE 109 (H) 01/02/2016   CHOL 211 (H)  07/11/2015   TRIG 205.0 (H) 07/11/2015   HDL 45.70 07/11/2015   LDLDIRECT 126.0 07/11/2015   LDLCALC 125 (H) 03/19/2015   ALT 43 01/02/2016   AST 22 01/02/2016   NA 141 01/02/2016   K 2.9 (L) 01/02/2016   CL 104 01/02/2016   CREATININE 1.05 (H) 01/02/2016   BUN 17 01/02/2016   CO2 25 01/02/2016   TSH 1.33 07/11/2015   HGBA1C 5.2 08/30/2013    Assessment & Plan:   Problem List Items Addressed This Visit    Acute diarrhea - Primary    New problem. Likely viral in origin. Advised hydration. Obtaining stool C diff and culture.      Relevant Orders   Comp Met (CMET)   Clostridium Difficile by PCR   Stool culture    Other Visit Diagnoses   None.     No orders of the defined types were placed in this encounter.   Follow-up: No Follow-up on file.  Lamar

## 2016-01-11 LAB — CLOSTRIDIUM DIFFICILE BY PCR: CDIFFPCR: NOT DETECTED

## 2016-01-14 ENCOUNTER — Other Ambulatory Visit: Payer: Self-pay

## 2016-01-14 LAB — STOOL CULTURE

## 2016-01-14 MED ORDER — POTASSIUM CHLORIDE ER 10 MEQ PO TBCR: 10.0000 meq | EXTENDED_RELEASE_TABLET | Freq: Every day | ORAL | 0 refills | Status: DC

## 2016-01-14 NOTE — Telephone Encounter (Signed)
Patient was last seen 12/31/15, would like 90 day supply

## 2016-01-19 ENCOUNTER — Other Ambulatory Visit: Payer: Self-pay | Admitting: Internal Medicine

## 2016-01-21 ENCOUNTER — Other Ambulatory Visit (INDEPENDENT_AMBULATORY_CARE_PROVIDER_SITE_OTHER): Payer: PPO

## 2016-01-21 DIAGNOSIS — R195 Other fecal abnormalities: Secondary | ICD-10-CM

## 2016-01-21 DIAGNOSIS — E876 Hypokalemia: Secondary | ICD-10-CM | POA: Diagnosis not present

## 2016-01-21 LAB — CBC WITH DIFFERENTIAL/PLATELET
BASOS ABS: 0 10*3/uL (ref 0.0–0.1)
Basophils Relative: 0.4 % (ref 0.0–3.0)
EOS ABS: 0.1 10*3/uL (ref 0.0–0.7)
Eosinophils Relative: 2.2 % (ref 0.0–5.0)
HCT: 42.1 % (ref 36.0–46.0)
Hemoglobin: 14.4 g/dL (ref 12.0–15.0)
LYMPHS ABS: 1 10*3/uL (ref 0.7–4.0)
LYMPHS PCT: 23.5 % (ref 12.0–46.0)
MCHC: 34.2 g/dL (ref 30.0–36.0)
MCV: 87.6 fl (ref 78.0–100.0)
MONOS PCT: 5.5 % (ref 3.0–12.0)
Monocytes Absolute: 0.2 10*3/uL (ref 0.1–1.0)
NEUTROS PCT: 68.4 % (ref 43.0–77.0)
Neutro Abs: 2.8 10*3/uL (ref 1.4–7.7)
Platelets: 228 10*3/uL (ref 150.0–400.0)
RBC: 4.81 Mil/uL (ref 3.87–5.11)
RDW: 14.1 % (ref 11.5–15.5)
WBC: 4.1 10*3/uL (ref 4.0–10.5)

## 2016-01-21 LAB — MAGNESIUM: MAGNESIUM: 1.8 mg/dL (ref 1.5–2.5)

## 2016-01-21 LAB — POTASSIUM: POTASSIUM: 3.6 meq/L (ref 3.5–5.1)

## 2016-01-22 ENCOUNTER — Telehealth: Payer: Self-pay | Admitting: Internal Medicine

## 2016-01-22 ENCOUNTER — Other Ambulatory Visit: Payer: Self-pay | Admitting: Internal Medicine

## 2016-01-22 DIAGNOSIS — E876 Hypokalemia: Secondary | ICD-10-CM

## 2016-01-22 NOTE — Progress Notes (Signed)
vm full.

## 2016-01-22 NOTE — Telephone Encounter (Signed)
Pt called returning your call regarding lab results. Thank you!

## 2016-01-22 NOTE — Telephone Encounter (Signed)
See other message

## 2016-01-28 ENCOUNTER — Encounter: Payer: Self-pay | Admitting: *Deleted

## 2016-02-06 ENCOUNTER — Other Ambulatory Visit (INDEPENDENT_AMBULATORY_CARE_PROVIDER_SITE_OTHER): Payer: PPO

## 2016-02-06 DIAGNOSIS — E876 Hypokalemia: Secondary | ICD-10-CM

## 2016-02-07 LAB — MAGNESIUM: Magnesium: 2 mg/dL (ref 1.5–2.5)

## 2016-02-07 LAB — POTASSIUM: Potassium: 4.2 mEq/L (ref 3.5–5.1)

## 2016-02-10 ENCOUNTER — Other Ambulatory Visit: Payer: Self-pay | Admitting: Internal Medicine

## 2016-02-10 DIAGNOSIS — R002 Palpitations: Secondary | ICD-10-CM

## 2016-02-10 DIAGNOSIS — E876 Hypokalemia: Secondary | ICD-10-CM

## 2016-02-10 NOTE — Progress Notes (Signed)
Order placed for f/u labs.  

## 2016-02-20 ENCOUNTER — Other Ambulatory Visit (INDEPENDENT_AMBULATORY_CARE_PROVIDER_SITE_OTHER): Payer: PPO

## 2016-02-20 DIAGNOSIS — R002 Palpitations: Secondary | ICD-10-CM

## 2016-02-20 DIAGNOSIS — E876 Hypokalemia: Secondary | ICD-10-CM

## 2016-02-20 LAB — MAGNESIUM: Magnesium: 2 mg/dL (ref 1.5–2.5)

## 2016-02-20 LAB — POTASSIUM: Potassium: 3.8 mEq/L (ref 3.5–5.1)

## 2016-02-21 ENCOUNTER — Telehealth: Payer: Self-pay | Admitting: Internal Medicine

## 2016-02-21 NOTE — Telephone Encounter (Signed)
Pt called returning your call for lab results. Thank you!

## 2016-02-24 ENCOUNTER — Encounter: Payer: Self-pay | Admitting: Internal Medicine

## 2016-02-25 ENCOUNTER — Encounter: Payer: Self-pay | Admitting: *Deleted

## 2016-03-06 ENCOUNTER — Ambulatory Visit
Admission: RE | Admit: 2016-03-06 | Discharge: 2016-03-06 | Disposition: A | Discharge status: Home / Self Care | Payer: PPO | Source: Ambulatory Visit | Attending: Family Medicine | Admitting: Family Medicine

## 2016-03-06 ENCOUNTER — Telehealth: Payer: Self-pay | Admitting: *Deleted

## 2016-03-06 ENCOUNTER — Encounter: Payer: Self-pay | Admitting: Family Medicine

## 2016-03-06 ENCOUNTER — Other Ambulatory Visit: Payer: Self-pay | Admitting: Internal Medicine

## 2016-03-06 ENCOUNTER — Ambulatory Visit (INDEPENDENT_AMBULATORY_CARE_PROVIDER_SITE_OTHER): Payer: PPO | Admitting: Family Medicine

## 2016-03-06 VITALS — BP 128/84 | HR 86 | Temp 98.3°F | Wt 142.4 lb

## 2016-03-06 DIAGNOSIS — N281 Cyst of kidney, acquired: Secondary | ICD-10-CM | POA: Insufficient documentation

## 2016-03-06 DIAGNOSIS — N200 Calculus of kidney: Secondary | ICD-10-CM | POA: Diagnosis not present

## 2016-03-06 DIAGNOSIS — R1084 Generalized abdominal pain: Secondary | ICD-10-CM

## 2016-03-06 DIAGNOSIS — Z9049 Acquired absence of other specified parts of digestive tract: Secondary | ICD-10-CM | POA: Insufficient documentation

## 2016-03-06 DIAGNOSIS — R109 Unspecified abdominal pain: Secondary | ICD-10-CM | POA: Insufficient documentation

## 2016-03-06 DIAGNOSIS — K760 Fatty (change of) liver, not elsewhere classified: Secondary | ICD-10-CM | POA: Diagnosis not present

## 2016-03-06 HISTORY — DX: Unspecified abdominal pain: R10.9

## 2016-03-06 LAB — POCT I-STAT CREATININE: CREATININE: 1 mg/dL (ref 0.44–1.00)

## 2016-03-06 MED ORDER — IOPAMIDOL (ISOVUE-300) INJECTION 61%
100.0000 mL | Freq: Once | INTRAVENOUS | Status: AC | PRN
  Administered 2016-03-06: 100 mL via INTRAVENOUS

## 2016-03-06 MED ORDER — AMOXICILLIN-POT CLAVULANATE 875-125 MG PO TABS: 1.0000 | ORAL_TABLET | Freq: Two times a day (BID) | ORAL | 0 refills | Status: DC

## 2016-03-06 NOTE — Progress Notes (Signed)
Pre visit review using our clinic review tool, if applicable. No additional management support is needed unless otherwise documented below in the visit note. 

## 2016-03-06 NOTE — Progress Notes (Signed)
  Tommi Rumps, MD Phone: [phone]  Debra Hale is a 65 y.o. female who presents today for same-day visit.  Patient notes over the last 24 hour she's had nausea, vomiting, diarrhea, and abdominal discomfort. The abdominal discomfort is generalized. Notes her diarrhea is purely liquid. She notes no urinary complaints. No vaginal discharge. No blood in her stool. No new foods or sick contacts. No new medications. No travel. 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 She's been trying to drink fluids though the liquids go straight through her. Has history of colon and breast cancer. Treated at least 5 years ago per her report for these. Had similar symptoms in September though not severe. She reports she is status post cholecystectomy and appendectomy.  PMH: nonsmoker.   ROS see history of present illness  Objective  Physical Exam Vitals:   03/06/16 1109  BP: 128/84  Pulse: 86  Temp: 98.3 F (36.8 C)    BP Readings from Last 3 Encounters:  03/06/16 128/84  01/10/16 103/70  01/02/16 129/77   Wt Readings from Last 3 Encounters:  03/06/16 142 lb 6.4 oz (64.6 kg)  01/10/16 141 lb 12.8 oz (64.3 kg)  01/02/16 144 lb 6.4 oz (65.5 kg)    Physical Exam  Constitutional: No distress.  HENT:  Mouth/Throat: Oropharynx is clear and moist. No oropharyngeal exudate.  Cardiovascular: Normal rate, regular rhythm and normal heart sounds.   Pulmonary/Chest: Effort normal and breath sounds normal.  Abdominal: Soft. Bowel sounds are normal. She exhibits no distension. There is tenderness ( diffuse tenderness). There is guarding (Intermittent scattered guarding). There is no rebound.  Neurological: She is alert. Gait normal.  Skin: Skin is warm and dry. She is not diaphoretic.     Assessment/Plan: Please see individual problem list.  Generalized abdominal pain Patient with onset of symptoms yesterday. Symptoms most consistent with viral gastroenteritis though with level of  abdominal discomfort and guarding on exam we will obtain a CT abdomen and pelvis to evaluate for potential other causes. 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 difficile. She will stay well hydrated. We will check lab work as well through the outpatient imaging center given their request for a creatinine. She's given return precautions.   Orders Placed This Encounter  Procedures  . Stool C-Diff Toxin Assay  . CT Abdomen Pelvis W Contrast    Last meal: 11/2    Standing Status:   Future    Standing Expiration Date:   06/06/2017    Order Specific Question:   If indicated for the ordered procedure, I authorize the administration of contrast media per Radiology protocol    Answer:   Yes    Order Specific Question:   Reason for Exam (SYMPTOM  OR DIAGNOSIS REQUIRED)    Answer:   abdominal pain with nausea, vomiting, diarrhea, guarding on exam    Order Specific Question:   Preferred imaging location?    Answer:   [facility]    Order Specific Question:   Call Results- Best Contact Number?    Answer:   [phone] hold patient   . Gastrointestinal Pathogen Panel PCR  . Comp Met (CMET)    Collect at hospital.  . Creatinine  . BUN    Tommi Rumps, MD [place]

## 2016-03-06 NOTE — Telephone Encounter (Signed)
Pt was scheduled for stomach pain and diarrhea for 24 hrs.

## 2016-03-06 NOTE — Telephone Encounter (Signed)
Mailbox is full.

## 2016-03-06 NOTE — Telephone Encounter (Signed)
Patient seen in the office today for abdominal pain. CT scan ordered and CMA took the report. I spoke with the patient regarding the results. It revealed enteritis. Concern for bacterial cause given her symptoms. I discussed her allergies with her. She has intolerances to Cipro and Flagyl which make her nauseous and it caused diarrhea. She also has an intolerance to Augmentin which caused diarrhea. She reports no other reactions to these medications. She is hesitant take Cipro and Flagyl. We will have her take Augmentin to cover for bacterial intestinal infection. She will take a probiotic with this. She will stick to a liquid diet through the weekend. We will have nursing contact her for follow-up on Monday or Tuesday of next week. She is given return precautions.

## 2016-03-06 NOTE — Assessment & Plan Note (Addendum)
Patient with onset of symptoms yesterday. Symptoms most consistent with viral gastroenteritis though with level of abdominal discomfort and guarding on exam we will obtain a CT abdomen and pelvis to evaluate for potential other causes. 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 difficile. She will stay well hydrated. We will check lab work as well through the outpatient imaging center given their request for a creatinine. She's given return precautions.

## 2016-03-06 NOTE — Patient Instructions (Signed)
Nice to see you. We are going to have you go get a CT scan. They will draw some lab work while you are there. We will have you do the stool studies as well. If you develop worsening abdominal pain, blood in your stools, or fevers please seek medical attention immediately.

## 2016-03-09 ENCOUNTER — Ambulatory Visit (INDEPENDENT_AMBULATORY_CARE_PROVIDER_SITE_OTHER): Payer: PPO | Admitting: Family Medicine

## 2016-03-09 ENCOUNTER — Other Ambulatory Visit: Payer: Self-pay | Admitting: Family Medicine

## 2016-03-09 VITALS — BP 120/76 | HR 88 | Temp 98.4°F | Wt 139.2 lb

## 2016-03-09 DIAGNOSIS — R1084 Generalized abdominal pain: Secondary | ICD-10-CM | POA: Diagnosis not present

## 2016-03-09 DIAGNOSIS — A09 Infectious gastroenteritis and colitis, unspecified: Secondary | ICD-10-CM

## 2016-03-09 DIAGNOSIS — R197 Diarrhea, unspecified: Secondary | ICD-10-CM

## 2016-03-09 NOTE — Assessment & Plan Note (Signed)
Patient's symptoms significantly improved. Still mildly tender on exam with intermittent guarding. Overall exam and history improved. Vital signs stable. She provided stool samples today. She'll continue Augmentin. Her most recent magnesium has been in the normal range and thus we'll have her hold her magnesium oxide to see if this benefits her diarrhea and return for lab work on Friday. She will start a probiotic as she did not do this last week when I advised her to. She'll continue to stay well hydrated. She's given return precautions.

## 2016-03-09 NOTE — Telephone Encounter (Signed)
Patient is coming in to be seen.

## 2016-03-09 NOTE — Progress Notes (Signed)
Pre visit review using our clinic review tool, if applicable. No additional management support is needed unless otherwise documented below in the visit note. 

## 2016-03-09 NOTE — Patient Instructions (Signed)
Nice to see you. I'm glad your symptoms are improved. You should finish the antibiotics. If you develop worsening stomach pain, blood in her stool, fevers, or any new or changing symptoms please seek medical attention.

## 2016-03-09 NOTE — Progress Notes (Signed)
  Tommi Rumps, MD Phone: [phone]  Debra Hale is a 65 y.o. female who presents today for follow-up.  Patient seen last week for abdominal pain and diarrhea. Had a CT scan that revealed enteritis. She was started on Augmentin. She notes her pain is significantly better. No pain unless her abdomen is pressed on. Still having diarrhea though she has chronic IBS related diarrhea. No blood in her stools. No fevers. No vomiting. No urinary symptoms. Overall she feels much improved. She does take magnesium oxide daily.   ROS see history of present illness  Objective  Physical Exam Vitals:   03/09/16 1601  BP: 120/76  Pulse: 88  Temp: 98.4 F (36.9 C)    BP Readings from Last 3 Encounters:  03/09/16 120/76  03/06/16 128/84  01/10/16 103/70   Wt Readings from Last 3 Encounters:  03/09/16 139 lb 3.2 oz (63.1 kg)  03/06/16 142 lb 6.4 oz (64.6 kg)  01/10/16 141 lb 12.8 oz (64.3 kg)    Physical Exam  Constitutional: She is well-developed, well-nourished, and in no distress.  Cardiovascular: Normal rate, regular rhythm and normal heart sounds.   Pulmonary/Chest: Effort normal and breath sounds normal.  Abdominal: Bowel sounds are normal. She exhibits no distension. There is tenderness (mild central abdominal discomfort with intermittent guarding). There is no rebound.  Musculoskeletal: She exhibits no edema.  Neurological: She is alert. Gait normal.  Skin: Skin is warm and dry.     Assessment/Plan: Please see individual problem list.  Generalized abdominal pain Patient's symptoms significantly improved. Still mildly tender on exam with intermittent guarding. Overall exam and history improved. Vital signs stable. She provided stool samples today. She'll continue Augmentin. Her most recent magnesium has been in the normal range and thus we'll have her hold her magnesium oxide to see if this benefits her diarrhea and return for lab work on Friday. She will start a probiotic  as she did not do this last week when I advised her to. She'll continue to stay well hydrated. She's given return precautions.   Orders Placed This Encounter  Procedures  . Comp Met (CMET)    Standing Status:   Future    Standing Expiration Date:   03/09/2017  . Magnesium    Standing Status:   Future    Standing Expiration Date:   03/09/2017    Tommi Rumps, MD [place]

## 2016-03-10 LAB — GASTROINTESTINAL PATHOGEN PANEL PCR
C. difficile Tox A/B, PCR: NOT DETECTED
CRYPTOSPORIDIUM, PCR: NOT DETECTED
Campylobacter, PCR: NOT DETECTED
E COLI (ETEC) LT/ST, PCR: NOT DETECTED
E COLI (STEC) STX1/STX2, PCR: NOT DETECTED
E coli 0157, PCR: NOT DETECTED
Giardia lamblia, PCR: NOT DETECTED
NOROVIRUS, PCR: NOT DETECTED
ROTAVIRUS, PCR: NOT DETECTED
SALMONELLA, PCR: NOT DETECTED
SHIGELLA, PCR: NOT DETECTED

## 2016-03-10 LAB — C. DIFFICILE GDH AND TOXIN A/B
C. DIFFICILE GDH: NOT DETECTED
C. difficile Toxin A/B: NOT DETECTED

## 2016-03-12 ENCOUNTER — Other Ambulatory Visit (INDEPENDENT_AMBULATORY_CARE_PROVIDER_SITE_OTHER): Payer: PPO

## 2016-03-12 DIAGNOSIS — R197 Diarrhea, unspecified: Secondary | ICD-10-CM

## 2016-03-12 DIAGNOSIS — A09 Infectious gastroenteritis and colitis, unspecified: Secondary | ICD-10-CM

## 2016-03-13 LAB — COMPREHENSIVE METABOLIC PANEL
ALBUMIN: 4.2 g/dL (ref 3.5–5.2)
ALT: 52 U/L — ABNORMAL HIGH (ref 0–35)
AST: 25 U/L (ref 0–37)
Alkaline Phosphatase: 91 U/L (ref 39–117)
BUN: 11 mg/dL (ref 6–23)
CHLORIDE: 108 meq/L (ref 96–112)
CO2: 28 meq/L (ref 19–32)
CREATININE: 1.05 mg/dL (ref 0.40–1.20)
Calcium: 9.3 mg/dL (ref 8.4–10.5)
GFR: 55.68 mL/min — ABNORMAL LOW (ref >60.00)
Glucose, Bld: 95 mg/dL (ref 70–99)
Potassium: 3.3 mEq/L — ABNORMAL LOW (ref 3.5–5.1)
Sodium: 144 mEq/L (ref 135–145)
Total Bilirubin: 0.6 mg/dL (ref 0.2–1.2)
Total Protein: 6.8 g/dL (ref 6.0–8.3)

## 2016-03-13 LAB — MAGNESIUM: MAGNESIUM: 1.7 mg/dL (ref 1.5–2.5)

## 2016-03-19 ENCOUNTER — Encounter: Payer: Self-pay | Admitting: Internal Medicine

## 2016-03-19 ENCOUNTER — Ambulatory Visit (INDEPENDENT_AMBULATORY_CARE_PROVIDER_SITE_OTHER): Payer: PPO | Admitting: Internal Medicine

## 2016-03-19 VITALS — BP 120/62 | HR 66 | Temp 98.3°F | Ht 65.0 in | Wt 141.2 lb

## 2016-03-19 DIAGNOSIS — F32A Depression, unspecified: Secondary | ICD-10-CM

## 2016-03-19 DIAGNOSIS — G6289 Other specified polyneuropathies: Secondary | ICD-10-CM

## 2016-03-19 DIAGNOSIS — R079 Chest pain, unspecified: Secondary | ICD-10-CM

## 2016-03-19 DIAGNOSIS — R195 Other fecal abnormalities: Secondary | ICD-10-CM

## 2016-03-19 DIAGNOSIS — K219 Gastro-esophageal reflux disease without esophagitis: Secondary | ICD-10-CM

## 2016-03-19 DIAGNOSIS — R7989 Other specified abnormal findings of blood chemistry: Secondary | ICD-10-CM

## 2016-03-19 DIAGNOSIS — E7800 Pure hypercholesterolemia, unspecified: Secondary | ICD-10-CM

## 2016-03-19 DIAGNOSIS — F320 Major depressive disorder, single episode, mild: Secondary | ICD-10-CM

## 2016-03-19 DIAGNOSIS — F439 Reaction to severe stress, unspecified: Secondary | ICD-10-CM

## 2016-03-19 DIAGNOSIS — F419 Anxiety disorder, unspecified: Secondary | ICD-10-CM

## 2016-03-19 DIAGNOSIS — R945 Abnormal results of liver function studies: Secondary | ICD-10-CM

## 2016-03-19 DIAGNOSIS — R197 Diarrhea, unspecified: Secondary | ICD-10-CM | POA: Diagnosis not present

## 2016-03-19 DIAGNOSIS — R002 Palpitations: Secondary | ICD-10-CM

## 2016-03-19 DIAGNOSIS — E876 Hypokalemia: Secondary | ICD-10-CM | POA: Diagnosis not present

## 2016-03-19 NOTE — Progress Notes (Signed)
Pre visit review using our clinic review tool, if applicable. No additional management support is needed unless otherwise documented below in the visit note. 

## 2016-03-19 NOTE — Progress Notes (Signed)
Patient ID: Debra Hale, female   DOB: [birth year], 65 y.o.   MRN: [id number]   Subjective:    Patient ID: Debra Hale, female    DOB: 01/04/1951, 65 y.o.   MRN: [id number]  HPI  Patient here for a scheduled follow up.  She has been seen recently for diarrhea.  Initially saw Dr Lacinda Axon on 01/10/16. See his note.   Reevaluated 03/06/16 by Dr Caryl Bis.  Note reviewed.  CT abdomen - enteritis.  Treated with augmentin.  Abdominal pain improved. She reports no pain now.  Has had persistent problems with diarrhea.  Appears to be worse at times, but does not completely resolve per her report.  She has seen GI.  Felt to have IBS with diarrhea.  Has been off her magnesium.  Does not appear to have made a difference.  Eating.  No nausea or vomiting.  No blood in the stool.  No significant increased palpitations.     Past Medical History:  Diagnosis Date  . Anxiety   . Breast cancer ([place]) 2009   right breast lumpectomy with rad tx  . Colon cancer ([place])    surgery with chemo and rad tx  . GERD (gastroesophageal reflux disease)   . Liver nodule    s/p negative biopsy  . Malignant neoplasm of thyroid gland ([place]) 2002   s/p surgery and XRT  . Nephrolithiasis    s/p lithotripsy  . Osteoporosis   . Other and unspecified hyperlipidemia   . Palpitations   . Personal history of malignant neoplasm of large intestine    carcinoma - cecum, s/p right laparoscopic colectomy - s/p chemotherapy and XRT  . Unspecified hereditary and idiopathic peripheral neuropathy    Past Surgical History:  Procedure Laterality Date  . APPENDECTOMY  1985  . BREAST BIOPSY Right 2009   positive  . BREAST BIOPSY Right 2009   negative  . BREAST LUMPECTOMY  2009   breast cancer  . CHOLECYSTECTOMY  1995  . DILATION AND CURETTAGE OF UTERUS  1990  . LAPAROSCOPIC PARTIAL COLECTOMY     stage 3-C carcinoma of the cecum, s/p chemotherapy and xrt  . LITHOTRIPSY    . THYROID LOBECTOMY  2002   s/p XRT   Family History  Problem  Relation Age of Onset  . Stroke Mother     60s  . Lung cancer Father   . Prostate cancer Father   . Breast cancer Sister     31's  . Lung cancer Sister   . Breast cancer Maternal Aunt    Social History   Social History  . Marital status: Divorced    Spouse name: N/A  . Number of children: 0  . Years of education: N/A   Social History Main Topics  . Smoking status: Never Smoker  . Smokeless tobacco: Never Used  . Alcohol use No  . Drug use: No  . Sexual activity: Not Asked   Other Topics Concern  . None   Social History Narrative  . None    Outpatient Encounter Prescriptions as of 03/19/2016  Medication Sig  . Azelastine-Fluticasone 137-50 MCG/ACT SUSP Two spray each nostril once a day  . bismuth subsalicylate (PEPTO-BISMOL) 262 MG/15ML suspension Take 15 mLs by mouth every 6 (six) hours as needed. Reported on 10/01/2015  . clonazePAM (KLONOPIN) 1 MG tablet Take 1 mg by mouth 2 (two) times daily.   . ergocalciferol (VITAMIN D2) 50000 units capsule Take 1 capsule (50,000 Units total) by mouth once  a week.  . escitalopram (LEXAPRO) 20 MG tablet Take 20 mg by mouth daily.    Marland Kitchen levothyroxine (SYNTHROID, LEVOTHROID) 88 MCG tablet TAKE 1 TABLET (88 MCG TOTAL) BY MOUTH DAILY BEFORE BREAKFAST.  Marland Kitchen loperamide (IMODIUM) 1 MG/5ML solution Take 1 mg by mouth as needed for diarrhea or loose stools.  . metoprolol succinate (TOPROL XL) 25 MG 24 hr tablet Take 0.5 tablets (12.5 mg total) by mouth daily.  . pantoprazole (PROTONIX) 40 MG tablet TAKE 1 TABLET BY MOUTH DAILY.  . [DISCONTINUED] amoxicillin-clavulanate (AUGMENTIN) 875-125 MG tablet Take 1 tablet by mouth 2 (two) times daily.  . [DISCONTINUED] potassium chloride (K-DUR) 10 MEQ tablet Take 1 tablet (10 mEq total) by mouth daily.   No facility-administered encounter medications on file as of 03/19/2016.     Review of Systems  Constitutional: Negative for appetite change and unexpected weight change.  HENT: Negative for  congestion and sinus pressure.   Respiratory: Negative for cough, chest tightness and shortness of breath.   Cardiovascular: Negative for chest pain, palpitations and leg swelling.  Gastrointestinal: Positive for diarrhea. Negative for abdominal pain, nausea and vomiting.  Genitourinary: Negative for difficulty urinating and dysuria.  Musculoskeletal: Negative for back pain and joint swelling.  Skin: Negative for color change and rash.  Neurological: Negative for dizziness, light-headedness and headaches.  Psychiatric/Behavioral: Negative for agitation and dysphoric mood.       Objective:    Physical Exam  Constitutional: She appears well-developed and well-nourished. No distress.  HENT:  Nose: Nose normal.  Mouth/Throat: Oropharynx is clear and moist.  Neck: Neck supple. No thyromegaly present.  Cardiovascular: Normal rate and regular rhythm.   Pulmonary/Chest: Breath sounds normal. No respiratory distress. She has no wheezes.  Abdominal: Soft. Bowel sounds are normal. There is no tenderness.  Musculoskeletal: She exhibits no edema or tenderness.  Lymphadenopathy:    She has no cervical adenopathy.  Skin: No rash noted. No erythema.  Psychiatric: She has a normal mood and affect. Her behavior is normal.    BP 120/62   Pulse 66   Temp 98.3 F (36.8 C) (Oral)   Ht 5\' 5"  (1.651 m)   Wt 141 lb 3.2 oz (64 kg)   SpO2 95%   BMI 23.50 kg/m  Wt Readings from Last 3 Encounters:  03/19/16 141 lb 3.2 oz (64 kg)  03/09/16 139 lb 3.2 oz (63.1 kg)  03/06/16 142 lb 6.4 oz (64.6 kg)     Lab Results  Component Value Date   WBC 4.1 01/21/2016   HGB 14.4 01/21/2016   HCT 42.1 01/21/2016   PLT 228.0 01/21/2016   GLUCOSE 87 03/19/2016   CHOL 211 (H) 07/11/2015   TRIG 205.0 (H) 07/11/2015   HDL 45.70 07/11/2015   LDLDIRECT 126.0 07/11/2015   LDLCALC 125 (H) 03/19/2015   ALT 52 (H) 03/12/2016   AST 25 03/12/2016   NA 144 03/19/2016   K 3.2 (L) 03/19/2016   CL 107 03/19/2016    CREATININE 1.02 03/19/2016   BUN 13 03/19/2016   CO2 29 03/19/2016   TSH 1.33 07/11/2015   HGBA1C 5.2 08/30/2013    Ct Abdomen Pelvis W Contrast  Result Date: 03/06/2016 CLINICAL DATA:  Severe lower abdominal cramping with nausea and vomiting for 30 hours, history of colon cancer post partial colonic resection, RIGHT breast cancer post lumpectomy, chemotherapy and radiation therapy, prior appendectomy and cholecystectomy, irritable bowel syndrome, thyroid cancer EXAM: CT ABDOMEN AND PELVIS WITH CONTRAST TECHNIQUE: Multidetector CT imaging of the  abdomen and pelvis was performed using the standard protocol following bolus administration of intravenous contrast. Sagittal and coronal MPR images reconstructed from axial data set. CONTRAST:  181mL ISOVUE-300 IOPAMIDOL (ISOVUE-300) INJECTION 61% IV. Dilute oral contrast. COMPARISON:  08/14/2011 FINDINGS: Lower chest: Lung bases clear Hepatobiliary: Post cholecystectomy. Fatty infiltration of liver. No focal hepatic lesions. Pancreas: Normal appearance Spleen: Normal appearance Adrenals/Urinary Tract: Adrenal glands normal appearance BILATERAL renal cysts. Nonobstructing calculus 5 mm calculus upper pole LEFT kidney image 26. No hydronephrosis, ureteral calcification hydroureter. Bladder unremarkable. Stomach/Bowel: Small hiatal hernia. Stomach otherwise normal appearance. Fluid identified within descending and sigmoid colon into rectum. Prior RIGHT hemicolectomy with ileocolic anastomosis at mid transverse colon in mid abdomen. No evidence of small bowel obstruction/dilatation. However, multiple distal ileal loops in the RIGHT abdomen demonstrate diffuse bowel wall thickening extending to near the ileocolic anastomosis compatible with small bowel enteritis. Minimal associated mesenteric edema of the involved loops. Remaining small bowel loops normal. Vascular/Lymphatic: Atherosclerotic calcification aorta without aneurysm. No adenopathy. Reproductive: Atrophic  uterus and ovaries. Other: No free air or free fluid.  No hernia. Musculoskeletal: Bones unremarkable. IMPRESSION: Prior RIGHT hemicolectomy. Multiple thickened distal ileal loops compatible with enteritis ; differential diagnosis includes infection, inflammatory bowel disease, radiation enteritis if patient underwent abdominal radiation therapy, unlikely ischemia. Diffuse hepatic steatosis. BILATERAL renal cysts and 5 mm nonobstructing LEFT renal calculus. Electronically Signed   By: Lavonia Dana M.D.   On: 03/06/2016 14:37       Assessment & Plan:   Problem List Items Addressed This Visit    Abnormal liver function test    Felt to have fatty liver.  Worked up by GI.  Follow liver panel.       Anxiety    Followed by Dr Clovis Riley.  On lexapro.       GERD (gastroesophageal reflux disease)    Controlled on protonix.       Hypercholesterolemia    Low cholesterol diet and exercise.  Follow lipid panel.       Loose stools    Persistent.  Has seen GI.  Start probiotics since recently on abx.  GI diagnosed with IBS - diarrhea.  Follow.  No pain now.       Mild depression ([place])    Has been followed by Dr Clovis Riley.  On lexapro.        Palpitations    Initially improved with replacement of magnesium and potassium.  Then previously had issues even when these levels wnl.  Appears to be better now.  Follow.  Recheck potassium today - since off.        Peripheral neuropathy ([place])    Noted since chemo.  No change reported today.  Stable.       Stress    On lexapro.  Stable.        Other Visit Diagnoses    Chest pain, unspecified type    -  Primary   Relevant Orders   Sedimentation rate (Completed)   Hypokalemia       Relevant Orders   Basic metabolic panel (Completed)   Diarrhea, unspecified type       Relevant Orders   Magnesium (Completed)       [name], MD

## 2016-03-20 ENCOUNTER — Other Ambulatory Visit: Payer: Self-pay | Admitting: Internal Medicine

## 2016-03-20 DIAGNOSIS — Z853 Personal history of malignant neoplasm of breast: Secondary | ICD-10-CM

## 2016-03-20 DIAGNOSIS — N644 Mastodynia: Secondary | ICD-10-CM

## 2016-03-20 LAB — BASIC METABOLIC PANEL
BUN: 13 mg/dL (ref 6–23)
CALCIUM: 9 mg/dL (ref 8.4–10.5)
CHLORIDE: 107 meq/L (ref 96–112)
CO2: 29 meq/L (ref 19–32)
Creatinine, Ser: 1.02 mg/dL (ref 0.40–1.20)
GFR: 57.57 mL/min — ABNORMAL LOW (ref >60.00)
Glucose, Bld: 87 mg/dL (ref 70–99)
POTASSIUM: 3.2 meq/L — AB (ref 3.5–5.1)
SODIUM: 144 meq/L (ref 135–145)

## 2016-03-20 LAB — MAGNESIUM: Magnesium: 1.6 mg/dL (ref 1.5–2.5)

## 2016-03-20 LAB — SEDIMENTATION RATE: SED RATE: 10 mm/h (ref 0–30)

## 2016-03-20 MED ORDER — POTASSIUM CHLORIDE ER 10 MEQ PO TBCR: 10.0000 meq | EXTENDED_RELEASE_TABLET | Freq: Every day | ORAL | 1 refills | Status: DC

## 2016-03-20 NOTE — Progress Notes (Signed)
Order placed for potassium refill.

## 2016-03-20 NOTE — Progress Notes (Signed)
Order placed for diagnostic mammogram and ultrasound 

## 2016-03-21 ENCOUNTER — Encounter: Payer: Self-pay | Admitting: Internal Medicine

## 2016-03-21 NOTE — Assessment & Plan Note (Signed)
On lexapro.  Stable.  ?

## 2016-03-21 NOTE — Assessment & Plan Note (Signed)
Followed by Dr Clovis Riley.  On lexapro.

## 2016-03-21 NOTE — Assessment & Plan Note (Signed)
Controlled on protonix.   

## 2016-03-21 NOTE — Assessment & Plan Note (Signed)
Initially improved with replacement of magnesium and potassium.  Then previously had issues even when these levels wnl.  Appears to be better now.  Follow.  Recheck potassium today - since off.

## 2016-03-21 NOTE — Assessment & Plan Note (Signed)
Felt to have fatty liver.  Worked up by GI.  Follow liver panel.

## 2016-03-21 NOTE — Assessment & Plan Note (Signed)
Noted since chemo.  No change reported today.  Stable.

## 2016-03-21 NOTE — Assessment & Plan Note (Signed)
Low cholesterol diet and exercise.  Follow lipid panel.   

## 2016-03-21 NOTE — Assessment & Plan Note (Signed)
Persistent.  Has seen GI.  Start probiotics since recently on abx.  GI diagnosed with IBS - diarrhea.  Follow.  No pain now.

## 2016-03-21 NOTE — Assessment & Plan Note (Signed)
Has been followed by Dr Clovis Riley.  On lexapro.

## 2016-03-23 ENCOUNTER — Telehealth: Payer: Self-pay | Admitting: Cardiovascular Disease

## 2016-03-23 NOTE — Telephone Encounter (Signed)
Dr Fletcher Anon had cancellation for tomorrow so there is an opening for an appt. Attempted to call patient on both numbers listed and no answer. Left message on mobile number to call back to confirm she can make it to appt tomorrow.

## 2016-03-23 NOTE — Telephone Encounter (Signed)
° °  Pt c/o of Chest Pain: STAT if CP now or developed within 24 hours  1. Are you having CP right now? Yes not as bad as last night   2. Are you experiencing any other symptoms (ex. SOB, nausea, vomiting, sweating)?   Patient hurting bottom of throat straight down aching pan especially at night has impoved but was hurting bad last night  left side chest pain above left breast   3. How long have you been experiencing CP? Last Friday (at Dr. Gabriel Carina )   4. Is your CP continuous or coming and going? Continuous severity changes   5. Have you taken Nitroglycerin? No  Nothing for pain ?   Patient wants to be seen sooner but nothing on the schedule until already scheduled time with [place]

## 2016-03-23 NOTE — Telephone Encounter (Signed)
Called patient and she answered. She is able to come to appt tomorrow at 2:15pm with Dr Fletcher Anon.  She said she had been sound asleep since we got off the phone earlier and that she was not having any pain or discomfort at this time.

## 2016-03-23 NOTE — Telephone Encounter (Signed)
Returned call to patient. Patient experiencing pain from her throat to stomach to the middle of her breast bone to left chest.  Described at "dull and achy" and  "sometimes worse when laying down." Pain gets better for a little while during the middle of the day when she rests. She could not tell me whether it was worse after eating, she said she can't remember. It started about 1 week ago and stated "it reminds me of when I had pericarditis a few years ago." Denies SOB, heaviness on chest, nausea or sweating. She does not have a BP cuff and states she's never had problems with her BP.  Patient stated she was seen on 03/19/16 by Dr Nicki Reaper who pressed down and breast bone and it hurt terribly. Patient recently had CT of Abd/pelvis r/t colon cancer. Recent labwork ordered by Dr Nicki Reaper showed decreased Potassium.   Patient has an appt on 04/07/16 with Dr Fletcher Anon, offered appt with PA earlier on 04/02/16 but denied at this time.  Pt verbalized understanding to go to the emergency room or call 911 if the chest discomfort worsens and she develops other symptoms, such as SOB, nausea, vomiting, or sweating.  Will route to Dr Fletcher Anon for advice.

## 2016-03-24 ENCOUNTER — Encounter: Payer: Self-pay | Admitting: Cardiovascular Disease

## 2016-03-24 ENCOUNTER — Ambulatory Visit (INDEPENDENT_AMBULATORY_CARE_PROVIDER_SITE_OTHER): Payer: PPO | Admitting: Cardiovascular Disease

## 2016-03-24 VITALS — BP 100/68 | HR 68 | Ht 64.0 in | Wt 143.2 lb

## 2016-03-24 DIAGNOSIS — R002 Palpitations: Secondary | ICD-10-CM | POA: Diagnosis not present

## 2016-03-24 DIAGNOSIS — R0789 Other chest pain: Secondary | ICD-10-CM | POA: Diagnosis not present

## 2016-03-24 NOTE — Progress Notes (Signed)
Cardiology Office Note   Date:  03/24/2016   ID:  Debra Hale, DOB 21-Feb-1951, MRN [id number]  PCP:  [name], MD  Cardiologist:   Kathlyn Sacramento, MD   Chief Complaint  Patient presents with  . other    Pt. c/o pain in throat that radiates down to her chest and esophagus; symptoms x 1 week. Meds reviewed by the pt. verbally.       History of Present Illness: Debra Hale is a 65 y.o. female who presents for evaluation of chest pain. She was seen by me in 2015 for recurrent pericarditis with documented small pericardial effusion which subsequently resolved with medical therapy. She has known history of hyperlipidemia. There is no history of hypertension, diabetes, tobacco use and no family history of coronary artery disease. Other medical conditions include thyroid carcinoma s/p surgery and XRT, colon cancer s/p laparoscopic right colectomy followed by Dr Ma Hillock, nephrolithiasis, anxiety, osteoporosis and breast cancer. She had palpitations in the setting of hypokalemia and hypomagnesemia. CT scan in 2015 showed no evidence of pulmonary embolism but there was evidence of coronary calcifications. She had a treadmill stress test done at that time which was normal.  She reports recurrent chest pain since last week which is somewhat different from her prior pericarditis. She reports sharp left-sided discomfort which does get worse with deep breathing and lying down. However, she is also describing substernal chest heaviness which has been intermittent. She cannot provide more details and she feels that it has been constant with no relationship to exertion. She reports no chest pain at the present time. She had labs done 5 days ago which showed normal sedimentation rate.  Past Medical History:  Diagnosis Date  . Anxiety   . Breast cancer ([place]) 2009   right breast lumpectomy with rad tx  . Colon cancer ([place])    surgery with chemo and rad tx  . GERD (gastroesophageal reflux  disease)   . Liver nodule    s/p negative biopsy  . Malignant neoplasm of thyroid gland ([place]) 2002   s/p surgery and XRT  . Nephrolithiasis    s/p lithotripsy  . Osteoporosis   . Other and unspecified hyperlipidemia   . Palpitations   . Personal history of malignant neoplasm of large intestine    carcinoma - cecum, s/p right laparoscopic colectomy - s/p chemotherapy and XRT  . Unspecified hereditary and idiopathic peripheral neuropathy     Past Surgical History:  Procedure Laterality Date  . APPENDECTOMY  1985  . BREAST BIOPSY Right 2009   positive  . BREAST BIOPSY Right 2009   negative  . BREAST LUMPECTOMY  2009   breast cancer  . CHOLECYSTECTOMY  1995  . DILATION AND CURETTAGE OF UTERUS  1990  . LAPAROSCOPIC PARTIAL COLECTOMY     stage 3-C carcinoma of the cecum, s/p chemotherapy and xrt  . LITHOTRIPSY    . THYROID LOBECTOMY  2002   s/p XRT     Current Outpatient Prescriptions  Medication Sig Dispense Refill  . Azelastine-Fluticasone 137-50 MCG/ACT SUSP Two spray each nostril once a day 23 g 3  . bismuth subsalicylate (PEPTO-BISMOL) 262 MG/15ML suspension Take 15 mLs by mouth every 6 (six) hours as needed. Reported on 10/01/2015    . clonazePAM (KLONOPIN) 1 MG tablet Take 1 mg by mouth 2 (two) times daily.     . ergocalciferol (VITAMIN D2) 50000 units capsule Take 1 capsule (50,000 Units total) by mouth once a week. 4  capsule 3  . escitalopram (LEXAPRO) 20 MG tablet Take 20 mg by mouth daily.      Marland Kitchen levothyroxine (SYNTHROID, LEVOTHROID) 88 MCG tablet TAKE 1 TABLET (88 MCG TOTAL) BY MOUTH DAILY BEFORE BREAKFAST. 90 tablet 2  . loperamide (IMODIUM) 1 MG/5ML solution Take 1 mg by mouth as needed for diarrhea or loose stools.    . metoprolol succinate (TOPROL XL) 25 MG 24 hr tablet Take 0.5 tablets (12.5 mg total) by mouth daily. 90 tablet 0  . pantoprazole (PROTONIX) 40 MG tablet TAKE 1 TABLET BY MOUTH DAILY. 90 tablet 2  . potassium chloride (K-DUR) 10 MEQ tablet Take 1  tablet (10 mEq total) by mouth daily. 30 tablet 1   No current facility-administered medications for this visit.     Allergies:   Augmentin [amoxicillin-pot clavulanate]; Bentyl [dicyclomine hcl]; Ciprofloxacin; Codeine; Epinephrine; Flagyl [metronidazole]; Librax [chlordiazepoxide-clidinium]; Phenobarbital; Tetracyclines & related; and Ultram [tramadol]    Social History:  The patient  reports that she has never smoked. She has never used smokeless tobacco. She reports that she does not drink alcohol or use drugs.   Family History:  The patient's family history includes Breast cancer in her maternal aunt and sister; Lung cancer in her father and sister; Prostate cancer in her father; Stroke in her mother.    ROS:  Please see the history of present illness.   Otherwise, review of systems are positive for none.   All other systems are reviewed and negative.    PHYSICAL EXAM: VS:  BP 100/68 (BP Location: Left Arm, Patient Position: Sitting, Cuff Size: Normal)   Pulse 68   Ht 5\' 4"  (1.626 m)   Wt 143 lb 4 oz (65 kg)   BMI 24.59 kg/m  , BMI Body mass index is 24.59 kg/m. GEN: Well nourished, well developed, in no acute distress  HEENT: normal  Neck: no JVD, carotid bruits, or masses Cardiac: RRR; no murmurs, rubs, or gallops,no edema  Respiratory:  clear to auscultation bilaterally, normal work of breathing GI: soft, nontender, nondistended, + BS MS: no deformity or atrophy  Skin: warm and dry, no rash Neuro:  Strength and sensation are intact Psych: euthymic mood, full affect   EKG:  EKG is ordered today. The ekg ordered today demonstrates normal sinus rhythm with low voltage.   Recent Labs: 07/11/2015: TSH 1.33 01/21/2016: Hemoglobin 14.4; Platelets 228.0 03/12/2016: ALT 52 03/19/2016: BUN 13; Creatinine, Ser 1.02; Magnesium 1.6; Potassium 3.2; Sodium 144    Lipid Panel    Component Value Date/Time   CHOL 211 (H) 07/11/2015 0956   TRIG 205.0 (H) 07/11/2015 0956   HDL  45.70 07/11/2015 0956   CHOLHDL 5 07/11/2015 0956   VLDL 41.0 (H) 07/11/2015 0956   LDLCALC 125 (H) 03/19/2015 1000   LDLDIRECT 126.0 07/11/2015 0956      Wt Readings from Last 3 Encounters:  03/24/16 143 lb 4 oz (65 kg)  03/19/16 141 lb 3.2 oz (64 kg)  03/09/16 139 lb 3.2 oz (63.1 kg)       ASSESSMENT AND PLAN:  1.  Chest pain with intermediate risk for a cardiac etiology: Previous CT scan in 2015 showed evidence of coronary calcifications. Although she had pericarditis in 2015, the presentation does not seem to be highly suggestive of that. Her sedimentation rate was normal 5 days ago. Previously in 2015 at was elevated. Current EKG does not show any ST deviation. I requested an echocardiogram to evaluate for possible pericardial effusion. Given that she is  describing 2 different types of chest pain, we also need to exclude underlying ischemic heart disease. I recommend a pharmacologic nuclear stress test. She reports inability to exercise on a treadmill and was extremely limited on the treadmill 2 years ago during her treadmill stress test. In the meanwhile, she can use short-term NSAIDs.  2. Palpitations: In the setting of hypokalemia and hypomagnesemia. Symptoms improved with replacement.    Disposition:   FU with me in one month  Signed,  Kathlyn Sacramento, MD  03/24/2016 5:12 PM    [place]

## 2016-03-24 NOTE — Patient Instructions (Addendum)
Medication Instructions:  Your physician recommends that you continue on your current medications as directed. Please refer to the Current Medication list given to you today.   Labwork: none  Testing/Procedures: Your physician has requested that you have an echocardiogram. Echocardiography is a painless test that uses sound waves to create images of your heart. It provides your doctor with information about the size and shape of your heart and how well your heart's chambers and valves are working. This procedure takes approximately one hour. There are no restrictions for this procedure.  Your physician has requested that you have a lexiscan myoview. For further information please visit [email or web address]. Please follow instruction sheet, as given.  [place]  Your caregiver has ordered a Stress Test with nuclear imaging. The purpose of this test is to evaluate the blood supply to your heart muscle. This procedure is referred to as a "Non-Invasive Stress Test." This is because other than having an IV started in your vein, nothing is inserted or "invades" your body. Cardiac stress tests are done to find areas of poor blood flow to the heart by determining the extent of coronary artery disease (CAD). Some patients exercise on a treadmill, which naturally increases the blood flow to your heart, while others who are  unable to walk on a treadmill due to physical limitations have a pharmacologic/chemical stress agent called Lexiscan . This medicine will mimic walking on a treadmill by temporarily increasing your coronary blood flow.   Please note: these test may take anywhere between 2-4 hours to complete  PLEASE REPORT TO [place] AT THE FIRST DESK WILL DIRECT YOU WHERE TO GO  Date of Procedure:_Tuesday, Nov 28___  Arrival Time for Procedure:_____8:15am_  Instructions regarding medication:    __xx__:  Hold metoprolol night before procedure and morning of  procedure    PLEASE NOTIFY THE OFFICE AT LEAST 24 HOURS IN ADVANCE IF YOU ARE UNABLE TO KEEP YOUR APPOINTMENT.  [phone] AND  PLEASE NOTIFY NUCLEAR MEDICINE AT [facility] AT LEAST 24 HOURS IN ADVANCE IF YOU ARE UNABLE TO KEEP YOUR APPOINTMENT. [phone]  How to prepare for your Myoview test:  1. Do not eat or drink after midnight 2. No caffeine for 24 hours prior to test 3. No smoking 24 hours prior to test. 4. Your medication may be taken with water.  If your doctor stopped a medication because of this test, do not take that medication. 5. Ladies, please do not wear dresses.  Skirts or pants are appropriate. Please wear a short sleeve shirt. 6. No perfume, cologne or lotion. 7. Wear comfortable walking shoes. No heels!            Follow-Up: Your physician recommends that you schedule a follow-up appointment in: one month with Dr. Fletcher Anon.    Any Other Special Instructions Will Be Listed Below (If Applicable).     If you need a refill on your cardiac medications before your next appointment, please call your pharmacy.  Echocardiogram An echocardiogram, or echocardiography, uses sound waves (ultrasound) to produce an image of your heart. The echocardiogram is simple, painless, obtained within a short period of time, and offers valuable information to your health care provider. The images from an echocardiogram can provide information such as:  Evidence of coronary artery disease (CAD).  Heart size.  Heart muscle function.  Heart valve function.  Aneurysm detection.  Evidence of a past heart attack.  Fluid buildup around the heart.  Heart muscle thickening.  Assess heart valve function. Tell a health care provider about:  Any allergies you have.  All medicines you are taking, including vitamins, herbs, eye drops, creams, and over-the-counter medicines.  Any problems you or family members have had with anesthetic medicines.  Any blood disorders you  have.  Any surgeries you have had.  Any medical conditions you have.  Whether you are pregnant or may be pregnant. What happens before the procedure? No special preparation is needed. Eat and drink normally. What happens during the procedure?  In order to produce an image of your heart, gel will be applied to your chest and a wand-like tool (transducer) will be moved over your chest. The gel will help transmit the sound waves from the transducer. The sound waves will harmlessly bounce off your heart to allow the heart images to be captured in real-time motion. These images will then be recorded.  You may need an IV to receive a medicine that improves the quality of the pictures. What happens after the procedure? You may return to your normal schedule including diet, activities, and medicines, unless your health care provider tells you otherwise. This information is not intended to replace advice given to you by your health care provider. Make sure you discuss any questions you have with your health care provider. Document Released: 04/17/2000 Document Revised: 12/07/2015 Document Reviewed: 12/26/2012 Elsevier Interactive Patient Education  2017 [employer]. Cardiac Nuclear Scanning A cardiac nuclear scan is used to check your heart for problems, such as the following:  A portion of the heart is not getting enough blood.  Part of the heart muscle has died, which happens with a heart attack.  The heart wall is not working normally.  In this test, a radioactive dye (tracer) is injected into your bloodstream. After the tracer has traveled to your heart, a scanning device is used to measure how much of the tracer is absorbed by or distributed to various areas of your heart. LET [facility] CARE PROVIDER KNOW ABOUT:  Any allergies you have.  All medicines you are taking, including vitamins, herbs, eye drops, creams, and over-the-counter medicines.  Previous problems you or members of  your family have had with the use of anesthetics.  Any blood disorders you have.  Previous surgeries you have had.  Medical conditions you have.  RISKS AND COMPLICATIONS Generally, this is a safe procedure. However, as with any procedure, problems can occur. Possible problems include:   Serious chest pain.  Rapid heartbeat.  Sensation of warmth in your chest. This usually passes quickly. BEFORE THE PROCEDURE Ask your health care provider about changing or stopping your regular medicines. PROCEDURE This procedure is usually done at a hospital and takes 2-4 hours.  An IV tube is inserted into one of your veins.  Your health care provider will inject a small amount of radioactive tracer through the tube.  You will then wait for 20-40 minutes while the tracer travels through your bloodstream.  You will lie down on an exam table so images of your heart can be taken. Images will be taken for about 15-20 minutes.  You will exercise on a treadmill or stationary bike. While you exercise, your heart activity will be monitored with an electrocardiogram (ECG), and your blood pressure will be checked.  If you are unable to exercise, you may be given a medicine to make your heart beat faster.  When blood flow to your heart has peaked, tracer will again be injected through the IV  tube.  After 20-40 minutes, you will get back on the exam table and have more images taken of your heart.  When the procedure is over, your IV tube will be removed. AFTER THE PROCEDURE  You will likely be able to leave shortly after the test. Unless your health care provider tells you otherwise, you may return to your normal schedule, including diet, activities, and medicines.  Make sure you find out how and when you will get your test results. This information is not intended to replace advice given to you by your health care provider. Make sure you discuss any questions you have with your health care  provider. Document Released: 05/15/2004 Document Revised: 04/25/2013 Document Reviewed: 03/29/2013 Elsevier Interactive Patient Education  2017 [employer].

## 2016-03-30 ENCOUNTER — Telehealth: Payer: Self-pay | Admitting: Cardiovascular Disease

## 2016-03-30 NOTE — Telephone Encounter (Signed)
Pt cancelled tomorrow's myoview this morning. States when her heart is stimulated/elevated, she has a panic attack. "I would start screaming and it wouldn't be a pretty sight" . Asks if a GXT or cardiac catheterization could be ordered. She has been chest pain free. Denies any other sx. Echo is scheduled 12/21 for possible pericardial effusion.   Advised pt to consider myoview and offered to answer questions but she states "that will never happen".  Will forward to MD to make aware.

## 2016-03-30 NOTE — Telephone Encounter (Signed)
Let's do a GXT.

## 2016-03-31 ENCOUNTER — Other Ambulatory Visit: Payer: Self-pay

## 2016-03-31 DIAGNOSIS — R079 Chest pain, unspecified: Secondary | ICD-10-CM

## 2016-03-31 NOTE — Telephone Encounter (Signed)
Attempted to contact pt again. No answer, no VM set up

## 2016-03-31 NOTE — Telephone Encounter (Signed)
No answer, no VM set up . Will call again.

## 2016-04-01 ENCOUNTER — Telehealth: Payer: Self-pay

## 2016-04-01 ENCOUNTER — Telehealth: Payer: Self-pay | Admitting: Cardiovascular Disease

## 2016-04-01 ENCOUNTER — Other Ambulatory Visit: Payer: Self-pay

## 2016-04-01 DIAGNOSIS — R0789 Other chest pain: Secondary | ICD-10-CM

## 2016-04-01 DIAGNOSIS — E876 Hypokalemia: Secondary | ICD-10-CM

## 2016-04-01 NOTE — Telephone Encounter (Signed)
Called pt. No answer, no VM set up. Letter mailed

## 2016-04-01 NOTE — Telephone Encounter (Signed)
Pt coming for labs 04/02/16. Please place future order. Thank you.

## 2016-04-01 NOTE — Telephone Encounter (Signed)
Order placed for f/u labs.  

## 2016-04-01 NOTE — Telephone Encounter (Signed)
Pt returned call. Reviewed Dr. Tyrell Antonio ok for GXT. Reviewed instructions, pt states she does not take metoprolol.  Routed to scheduling for an appt.

## 2016-04-02 ENCOUNTER — Other Ambulatory Visit (INDEPENDENT_AMBULATORY_CARE_PROVIDER_SITE_OTHER): Payer: PPO

## 2016-04-02 DIAGNOSIS — E876 Hypokalemia: Secondary | ICD-10-CM

## 2016-04-02 LAB — BASIC METABOLIC PANEL
BUN: 20 mg/dL (ref 6–23)
CHLORIDE: 104 meq/L (ref 96–112)
CO2: 30 meq/L (ref 19–32)
Calcium: 9.4 mg/dL (ref 8.4–10.5)
Creatinine, Ser: 0.99 mg/dL (ref 0.40–1.20)
GFR: 59.58 mL/min — AB (ref >60.00)
GLUCOSE: 101 mg/dL — AB (ref 70–99)
POTASSIUM: 3.5 meq/L (ref 3.5–5.1)
SODIUM: 141 meq/L (ref 135–145)

## 2016-04-02 LAB — MAGNESIUM: Magnesium: 2 mg/dL (ref 1.5–2.5)

## 2016-04-03 ENCOUNTER — Other Ambulatory Visit: Payer: Self-pay | Admitting: Internal Medicine

## 2016-04-03 DIAGNOSIS — E876 Hypokalemia: Secondary | ICD-10-CM

## 2016-04-03 NOTE — Progress Notes (Signed)
Order placed for f/u potassium.  

## 2016-04-07 ENCOUNTER — Ambulatory Visit: Payer: Self-pay | Admitting: Cardiovascular Disease

## 2016-04-07 ENCOUNTER — Ambulatory Visit (INDEPENDENT_AMBULATORY_CARE_PROVIDER_SITE_OTHER): Payer: PPO

## 2016-04-07 DIAGNOSIS — R079 Chest pain, unspecified: Secondary | ICD-10-CM

## 2016-04-07 LAB — EXERCISE TOLERANCE TEST
CHL CUP RESTING HR STRESS: 99 {beats}/min
CSEPED: 2 min
CSEPEDS: 43 s
CSEPHR: 88 %
Estimated workload: 4.6 METS
MPHR: 154 {beats}/min
Peak HR: 136 {beats}/min

## 2016-04-10 ENCOUNTER — Ambulatory Visit: Payer: PPO

## 2016-04-10 ENCOUNTER — Other Ambulatory Visit: Payer: Self-pay

## 2016-04-14 ENCOUNTER — Other Ambulatory Visit: Payer: Self-pay | Admitting: Internal Medicine

## 2016-04-23 ENCOUNTER — Other Ambulatory Visit: Payer: Self-pay

## 2016-04-23 ENCOUNTER — Other Ambulatory Visit (INDEPENDENT_AMBULATORY_CARE_PROVIDER_SITE_OTHER): Payer: PPO

## 2016-04-23 DIAGNOSIS — E876 Hypokalemia: Secondary | ICD-10-CM

## 2016-04-23 LAB — POTASSIUM: POTASSIUM: 3.3 meq/L — AB (ref 3.5–5.1)

## 2016-04-24 ENCOUNTER — Other Ambulatory Visit: Payer: Self-pay | Admitting: Internal Medicine

## 2016-04-24 DIAGNOSIS — E876 Hypokalemia: Secondary | ICD-10-CM

## 2016-04-24 NOTE — Progress Notes (Signed)
Order placed for f/u potassium check.  

## 2016-04-28 ENCOUNTER — Ambulatory Visit: Payer: PPO

## 2016-04-28 ENCOUNTER — Other Ambulatory Visit: Payer: Self-pay

## 2016-04-28 ENCOUNTER — Ambulatory Visit: Payer: Self-pay | Admitting: Cardiovascular Disease

## 2016-05-12 ENCOUNTER — Other Ambulatory Visit: Payer: Self-pay

## 2016-05-12 ENCOUNTER — Ambulatory Visit (INDEPENDENT_AMBULATORY_CARE_PROVIDER_SITE_OTHER): Payer: PPO

## 2016-05-12 DIAGNOSIS — R0789 Other chest pain: Secondary | ICD-10-CM

## 2016-05-12 NOTE — Progress Notes (Unsigned)
Debra Hale

## 2016-05-14 ENCOUNTER — Other Ambulatory Visit: Payer: Self-pay

## 2016-05-14 ENCOUNTER — Other Ambulatory Visit: Payer: Self-pay | Admitting: Internal Medicine

## 2016-05-14 ENCOUNTER — Ambulatory Visit
Admission: RE | Admit: 2016-05-14 | Discharge: 2016-05-14 | Disposition: A | Discharge status: Home / Self Care | Payer: PPO | Source: Ambulatory Visit | Attending: Internal Medicine | Admitting: Internal Medicine

## 2016-05-14 DIAGNOSIS — Z853 Personal history of malignant neoplasm of breast: Secondary | ICD-10-CM

## 2016-05-14 DIAGNOSIS — Z1231 Encounter for screening mammogram for malignant neoplasm of breast: Secondary | ICD-10-CM | POA: Insufficient documentation

## 2016-05-14 DIAGNOSIS — N644 Mastodynia: Secondary | ICD-10-CM

## 2016-05-14 DIAGNOSIS — N6489 Other specified disorders of breast: Secondary | ICD-10-CM | POA: Diagnosis not present

## 2016-05-14 LAB — HM MAMMOGRAPHY

## 2016-05-18 ENCOUNTER — Other Ambulatory Visit (INDEPENDENT_AMBULATORY_CARE_PROVIDER_SITE_OTHER): Payer: PPO

## 2016-05-18 DIAGNOSIS — E876 Hypokalemia: Secondary | ICD-10-CM

## 2016-05-18 LAB — POTASSIUM: POTASSIUM: 3.6 meq/L (ref 3.5–5.1)

## 2016-05-19 ENCOUNTER — Ambulatory Visit (INDEPENDENT_AMBULATORY_CARE_PROVIDER_SITE_OTHER): Payer: PPO | Admitting: Cardiovascular Disease

## 2016-05-19 ENCOUNTER — Encounter: Payer: Self-pay | Admitting: Cardiovascular Disease

## 2016-05-19 VITALS — BP 118/72 | HR 82 | Ht 64.0 in | Wt 142.2 lb

## 2016-05-19 DIAGNOSIS — R002 Palpitations: Secondary | ICD-10-CM

## 2016-05-19 DIAGNOSIS — R0789 Other chest pain: Secondary | ICD-10-CM

## 2016-05-19 NOTE — Progress Notes (Signed)
Cardiology Office Note   Date:  05/19/2016   ID:  Debra Hale, DOB 03-12-1951, MRN [id number]  PCP:  [name], MD  Cardiologist:   Kathlyn Sacramento, MD   Chief Complaint  Patient presents with  . other     F/U Echo & GXT. Pt states she is doing well. Reviewed meds with pt verbally.      History of Present Illness: Debra Hale is a 66 y.o. female who presents for a follow up visit regarding chest pain. She was seen by me in 2015 for recurrent pericarditis with documented small pericardial effusion which subsequently resolved with medical therapy. She has known history of hyperlipidemia. There is no history of hypertension, diabetes, tobacco use and no family history of coronary artery disease. Other medical conditions include thyroid carcinoma s/p surgery and XRT, colon cancer s/p laparoscopic right colectomy followed by Dr Ma Hillock, nephrolithiasis, anxiety, osteoporosis and breast cancer. She had palpitations in the setting of hypokalemia and hypomagnesemia. CT scan in 2015 showed no evidence of pulmonary embolism but there was evidence of coronary calcifications.   She was seen recently for atypical chest pain . EKG was unremarkable. She underwent an echocardiogram which was normal and showed no evidence of pericardial effusion. Sedimentation rate was normal. She underwent a treadmill stress test which was unremarkable except for poor exercise tolerance as she was able to exercise for only 2 minutes and 40 seconds. She reports no recurrent symptoms of chest pain or shortness of breath. She is back to baseline.  Past Medical History:  Diagnosis Date  . Anxiety   . Breast cancer ([place]) 2009   right breast lumpectomy with rad tx  . Colon cancer ([place])    surgery with chemo and rad tx  . GERD (gastroesophageal reflux disease)   . Liver nodule    s/p negative biopsy  . Malignant neoplasm of thyroid gland ([place]) 2002   s/p surgery and XRT  . Nephrolithiasis    s/p  lithotripsy  . Osteoporosis   . Other and unspecified hyperlipidemia   . Palpitations   . Personal history of malignant neoplasm of large intestine    carcinoma - cecum, s/p right laparoscopic colectomy - s/p chemotherapy and XRT  . Unspecified hereditary and idiopathic peripheral neuropathy     Past Surgical History:  Procedure Laterality Date  . APPENDECTOMY  1985  . BREAST BIOPSY Right 2009   positive  . BREAST BIOPSY Right 2009   negative  . BREAST LUMPECTOMY  2009   breast cancer  . CHOLECYSTECTOMY  1995  . DILATION AND CURETTAGE OF UTERUS  1990  . LAPAROSCOPIC PARTIAL COLECTOMY     stage 3-C carcinoma of the cecum, s/p chemotherapy and xrt  . LITHOTRIPSY    . THYROID LOBECTOMY  2002   s/p XRT     Current Outpatient Prescriptions  Medication Sig Dispense Refill  . Azelastine-Fluticasone 137-50 MCG/ACT SUSP Two spray each nostril once a day 23 g 3  . bismuth subsalicylate (PEPTO-BISMOL) 262 MG/15ML suspension Take 15 mLs by mouth every 6 (six) hours as needed. Reported on 10/01/2015    . clonazePAM (KLONOPIN) 1 MG tablet Take 1 mg by mouth 2 (two) times daily.     . ergocalciferol (VITAMIN D2) 50000 units capsule Take 1 capsule (50,000 Units total) by mouth once a week. 4 capsule 3  . escitalopram (LEXAPRO) 20 MG tablet Take 20 mg by mouth daily.      Marland Kitchen KLOR-CON 10 10 MEQ  tablet TAKE 1 TABLET (10 MEQ TOTAL) BY MOUTH DAILY. 90 tablet 1  . levothyroxine (SYNTHROID, LEVOTHROID) 88 MCG tablet TAKE 1 TABLET (88 MCG TOTAL) BY MOUTH DAILY BEFORE BREAKFAST. 90 tablet 2  . loperamide (IMODIUM) 1 MG/5ML solution Take 1 mg by mouth as needed for diarrhea or loose stools.    . magnesium oxide (MAG-OX) 400 (241.3 Mg) MG tablet Take 400 mg by mouth daily.  3  . metoprolol succinate (TOPROL XL) 25 MG 24 hr tablet Take 0.5 tablets (12.5 mg total) by mouth daily. 90 tablet 0  . pantoprazole (PROTONIX) 40 MG tablet TAKE 1 TABLET BY MOUTH DAILY. 90 tablet 2  . potassium chloride (K-DUR) 10  MEQ tablet Take 1 tablet (10 mEq total) by mouth daily. 30 tablet 1   No current facility-administered medications for this visit.     Allergies:   Augmentin [amoxicillin-pot clavulanate]; Bentyl [dicyclomine hcl]; Ciprofloxacin; Codeine; Epinephrine; Flagyl [metronidazole]; Librax [chlordiazepoxide-clidinium]; Phenobarbital; Tetracyclines & related; and Ultram [tramadol]    Social History:  The patient  reports that she has never smoked. She has never used smokeless tobacco. She reports that she does not drink alcohol or use drugs.   Family History:  The patient's family history includes Breast cancer in her maternal aunt and sister; Lung cancer in her father and sister; Prostate cancer in her father; Stroke in her mother.    ROS:  Please see the history of present illness.   Otherwise, review of systems are positive for none.   All other systems are reviewed and negative.    PHYSICAL EXAM: VS:  BP 118/72 (BP Location: Left Arm, Patient Position: Sitting, Cuff Size: Normal)   Pulse 82   Ht 5\' 4"  (1.626 m)   Wt 142 lb 4 oz (64.5 kg)   BMI 24.42 kg/m  , BMI Body mass index is 24.42 kg/m. GEN: Well nourished, well developed, in no acute distress  HEENT: normal  Neck: no JVD, carotid bruits, or masses Cardiac: RRR; no murmurs, rubs, or gallops,no edema  Respiratory:  clear to auscultation bilaterally, normal work of breathing GI: soft, nontender, nondistended, + BS MS: no deformity or atrophy  Skin: warm and dry, no rash Neuro:  Strength and sensation are intact Psych: euthymic mood, full affect   EKG:  EKG is ordered today. The ekg ordered today demonstrates normal sinus rhythm with low voltage.   Recent Labs: 07/11/2015: TSH 1.33 01/21/2016: Hemoglobin 14.4; Platelets 228.0 03/12/2016: ALT 52 04/02/2016: BUN 20; Creatinine, Ser 0.99; Magnesium 2.0; Sodium 141 05/18/2016: Potassium 3.6    Lipid Panel    Component Value Date/Time   CHOL 211 (H) 07/11/2015 0956   TRIG 205.0  (H) 07/11/2015 0956   HDL 45.70 07/11/2015 0956   CHOLHDL 5 07/11/2015 0956   VLDL 41.0 (H) 07/11/2015 0956   LDLCALC 125 (H) 03/19/2015 1000   LDLDIRECT 126.0 07/11/2015 0956      Wt Readings from Last 3 Encounters:  05/19/16 142 lb 4 oz (64.5 kg)  03/24/16 143 lb 4 oz (65 kg)  03/19/16 141 lb 3.2 oz (64 kg)       ASSESSMENT AND PLAN:  1.  Atypical chest pain: Prior history of pericarditis but there is no evidence of recurrent pericarditis on current evaluation. Echocardiogram was normal. Treadmill stress test was unremarkable except for poor exercise tolerance. The patient's symptoms all resolved. No further workup is recommended. I discussed with her the importance of healthy lifestyle changes and regular exercise.  2. Palpitations: In the  setting of hypokalemia and hypomagnesemia. Symptoms resolved completely.  Disposition:   FU with me as needed.  Signed,  Kathlyn Sacramento, MD  05/19/2016 3:51 PM    [place]

## 2016-05-19 NOTE — Patient Instructions (Signed)
Medication Instructions: No change.   Labwork: None.   Procedures/Testing: None.   Follow-Up: As needed with Dr. .   Any Additional Special Instructions Will Be Listed Below (If Applicable).     If you need a refill on your cardiac medications before your next appointment, please call your pharmacy.   

## 2016-06-09 ENCOUNTER — Encounter: Payer: Self-pay | Admitting: Internal Medicine

## 2016-06-09 ENCOUNTER — Ambulatory Visit (INDEPENDENT_AMBULATORY_CARE_PROVIDER_SITE_OTHER): Payer: PPO | Admitting: Internal Medicine

## 2016-06-09 DIAGNOSIS — F320 Major depressive disorder, single episode, mild: Secondary | ICD-10-CM

## 2016-06-09 DIAGNOSIS — F439 Reaction to severe stress, unspecified: Secondary | ICD-10-CM

## 2016-06-09 DIAGNOSIS — E559 Vitamin D deficiency, unspecified: Secondary | ICD-10-CM

## 2016-06-09 DIAGNOSIS — E7800 Pure hypercholesterolemia, unspecified: Secondary | ICD-10-CM

## 2016-06-09 DIAGNOSIS — R7989 Other specified abnormal findings of blood chemistry: Secondary | ICD-10-CM

## 2016-06-09 DIAGNOSIS — K219 Gastro-esophageal reflux disease without esophagitis: Secondary | ICD-10-CM

## 2016-06-09 DIAGNOSIS — F419 Anxiety disorder, unspecified: Secondary | ICD-10-CM

## 2016-06-09 DIAGNOSIS — R002 Palpitations: Secondary | ICD-10-CM

## 2016-06-09 DIAGNOSIS — R945 Abnormal results of liver function studies: Secondary | ICD-10-CM

## 2016-06-09 DIAGNOSIS — F32A Depression, unspecified: Secondary | ICD-10-CM

## 2016-06-09 DIAGNOSIS — Z23 Encounter for immunization: Secondary | ICD-10-CM

## 2016-06-09 DIAGNOSIS — C50911 Malignant neoplasm of unspecified site of right female breast: Secondary | ICD-10-CM

## 2016-06-09 DIAGNOSIS — C73 Malignant neoplasm of thyroid gland: Secondary | ICD-10-CM

## 2016-06-09 NOTE — Progress Notes (Signed)
Pre-visit discussion using our clinic review tool. No additional management support is needed unless otherwise documented below in the visit note.  

## 2016-06-09 NOTE — Progress Notes (Signed)
Patient ID: SAMIAH RICKLEFS, female   DOB: 12/14/1950, 66 y.o.   MRN: [id number]   Subjective:    Patient ID: XAN SPARKMAN, female    DOB: December 15, 1950, 66 y.o.   MRN: [id number]  HPI  Patient here for a scheduled follow up.  She reports she feels better.  No increased heart beat or palpitations.  No chest pain.  No sob.  No acid reflux.  No abdominal pain or cramping.  Recently saw cardiology.  ESR wnl.  Stress test ok.  Some fatigue.  Naps help.  Sees psychiatry.  Currently feels better.     Past Medical History:  Diagnosis Date  . Anxiety   . Breast cancer ([place]) 2009   right breast lumpectomy with rad tx  . Colon cancer ([place])    surgery with chemo and rad tx  . GERD (gastroesophageal reflux disease)   . Liver nodule    s/p negative biopsy  . Malignant neoplasm of thyroid gland ([place]) 2002   s/p surgery and XRT  . Nephrolithiasis    s/p lithotripsy  . Osteoporosis   . Other and unspecified hyperlipidemia   . Palpitations   . Personal history of malignant neoplasm of large intestine    carcinoma - cecum, s/p right laparoscopic colectomy - s/p chemotherapy and XRT  . Unspecified hereditary and idiopathic peripheral neuropathy    Past Surgical History:  Procedure Laterality Date  . APPENDECTOMY  1985  . BREAST BIOPSY Right 2009   positive  . BREAST BIOPSY Right 2009   negative  . BREAST LUMPECTOMY  2009   breast cancer  . CHOLECYSTECTOMY  1995  . DILATION AND CURETTAGE OF UTERUS  1990  . LAPAROSCOPIC PARTIAL COLECTOMY     stage 3-C carcinoma of the cecum, s/p chemotherapy and xrt  . LITHOTRIPSY    . THYROID LOBECTOMY  2002   s/p XRT   Family History  Problem Relation Age of Onset  . Stroke Mother     72s  . Lung cancer Father   . Prostate cancer Father   . Breast cancer Sister     61's  . Lung cancer Sister   . Breast cancer Maternal Aunt    Social History   Social History  . Marital status: Divorced    Spouse name: N/A  . Number of children: 0  . Years of  education: N/A   Social History Main Topics  . Smoking status: Never Smoker  . Smokeless tobacco: Never Used  . Alcohol use No  . Drug use: No  . Sexual activity: Not Asked   Other Topics Concern  . None   Social History Narrative  . None    Outpatient Encounter Prescriptions as of 06/09/2016  Medication Sig  . Azelastine-Fluticasone 137-50 MCG/ACT SUSP Two spray each nostril once a day  . bismuth subsalicylate (PEPTO-BISMOL) 262 MG/15ML suspension Take 15 mLs by mouth every 6 (six) hours as needed. Reported on 10/01/2015  . clonazePAM (KLONOPIN) 1 MG tablet Take 1 mg by mouth 2 (two) times daily.   . ergocalciferol (VITAMIN D2) 50000 units capsule Take 1 capsule (50,000 Units total) by mouth once a week.  . escitalopram (LEXAPRO) 20 MG tablet Take 20 mg by mouth daily.    Marland Kitchen KLOR-CON 10 10 MEQ tablet TAKE 1 TABLET (10 MEQ TOTAL) BY MOUTH DAILY.  Marland Kitchen levothyroxine (SYNTHROID, LEVOTHROID) 88 MCG tablet TAKE 1 TABLET (88 MCG TOTAL) BY MOUTH DAILY BEFORE BREAKFAST.  Marland Kitchen loperamide (IMODIUM) 1 MG/5ML solution  Take 1 mg by mouth as needed for diarrhea or loose stools.  . metoprolol succinate (TOPROL XL) 25 MG 24 hr tablet Take 0.5 tablets (12.5 mg total) by mouth daily.  . pantoprazole (PROTONIX) 40 MG tablet TAKE 1 TABLET BY MOUTH DAILY.  Marland Kitchen potassium chloride (K-DUR) 10 MEQ tablet Take 1 tablet (10 mEq total) by mouth daily.  . [DISCONTINUED] magnesium oxide (MAG-OX) 400 (241.3 Mg) MG tablet Take 400 mg by mouth daily.   No facility-administered encounter medications on file as of 06/09/2016.     Review of Systems  Constitutional: Negative for appetite change and unexpected weight change.  HENT: Negative for congestion and sinus pressure.   Eyes: Negative for pain and visual disturbance.  Respiratory: Negative for cough, chest tightness and shortness of breath.   Cardiovascular: Negative for chest pain, palpitations and leg swelling.  Gastrointestinal: Negative for abdominal pain, diarrhea,  nausea and vomiting.  Genitourinary: Negative for difficulty urinating and dysuria.  Musculoskeletal: Negative for back pain and joint swelling.  Skin: Negative for color change and rash.  Neurological: Negative for dizziness, light-headedness and headaches.  Hematological: Negative for adenopathy. Does not bruise/bleed easily.  Psychiatric/Behavioral: Negative for agitation and dysphoric mood.       Objective:    Physical Exam  Constitutional: She is oriented to person, place, and time. She appears well-developed and well-nourished. No distress.  HENT:  Nose: Nose normal.  Mouth/Throat: Oropharynx is clear and moist.  Eyes: Right eye exhibits no discharge. Left eye exhibits no discharge. No scleral icterus.  Neck: Neck supple. No thyromegaly present.  Cardiovascular: Normal rate and regular rhythm.   Pulmonary/Chest: Breath sounds normal. No accessory muscle usage. No tachypnea. No respiratory distress. She has no decreased breath sounds. She has no wheezes. She has no rhonchi. Right breast exhibits no inverted nipple, no mass, no nipple discharge and no tenderness (no axillary adenopathy). Left breast exhibits no inverted nipple, no mass, no nipple discharge and no tenderness (no axilarry adenopathy).  Abdominal: Soft. Bowel sounds are normal. There is no tenderness.  Musculoskeletal: She exhibits no edema or tenderness.  Lymphadenopathy:    She has no cervical adenopathy.  Neurological: She is alert and oriented to person, place, and time.  Skin: Skin is warm. No rash noted. No erythema.  Psychiatric: She has a normal mood and affect. Her behavior is normal.    BP 110/68 (BP Location: Left Arm, Patient Position: Sitting, Cuff Size: Large)   Pulse 83   Temp 98.6 F (37 C) (Oral)   Ht 5' 4" (1.626 m)   Wt 142 lb 6.4 oz (64.6 kg)   SpO2 95%   BMI 24.44 kg/m  Wt Readings from Last 3 Encounters:  06/09/16 142 lb 6.4 oz (64.6 kg)  05/19/16 142 lb 4 oz (64.5 kg)  03/24/16 143 lb  4 oz (65 kg)     Lab Results  Component Value Date   WBC 4.1 01/21/2016   HGB 14.4 01/21/2016   HCT 42.1 01/21/2016   PLT 228.0 01/21/2016   GLUCOSE 101 (H) 04/02/2016   CHOL 211 (H) 07/11/2015   TRIG 205.0 (H) 07/11/2015   HDL 45.70 07/11/2015   LDLDIRECT 126.0 07/11/2015   LDLCALC 125 (H) 03/19/2015   ALT 52 (H) 03/12/2016   AST 25 03/12/2016   NA 141 04/02/2016   K 3.6 05/18/2016   CL 104 04/02/2016   CREATININE 0.99 04/02/2016   BUN 20 04/02/2016   CO2 30 04/02/2016   TSH 1.33 07/11/2015  HGBA1C 5.2 08/30/2013    US Breast Ltd Uni Left Inc Axilla  Result Date: 05/14/2016 CLINICAL DATA:  66 year old female referred for left breast pain from 2-3 o'clock. The patient states that she no longer has the pain. She thinks that the pain lasted several weeks and is unsure of the exact location of where the pain was. EXAM: 2D DIGITAL DIAGNOSTIC LEFT MAMMOGRAM WITH CAD AND ADJUNCT TOMO ULTRASOUND LEFT BREAST COMPARISON:  Previous exam(s). ACR Breast Density Category c: The breast tissue is heterogeneously dense, which may obscure small masses. FINDINGS: No suspicious mass, calcifications, or other abnormality is identified within the left breast. Mammographic images were processed with CAD. On physical exam, no discrete mass is felt in the area of concern within the left breast from 2-3 o'clock Targeted ultrasound of the left breast was performed from 2-3 o'clock demonstrating no suspicious cystic or solid sonographic finding in the area of concern. IMPRESSION: No mammographic or sonographic evidence of malignancy. RECOMMENDATION: Bilateral screening mammogram in 5 months (June 2018). I have discussed the findings and recommendations with the patient. Results were also provided in writing at the conclusion of the visit. If applicable, a reminder letter will be sent to the patient regarding the next appointment. BI-RADS CATEGORY  1: Negative. Electronically Signed   By: Pamelia Hoit M.D.   On:  05/14/2016 16:09   Mm Diag Breast Tomo Uni Left  Result Date: 05/14/2016 CLINICAL DATA:  66 year old female referred for left breast pain from 2-3 o'clock. The patient states that she no longer has the pain. She thinks that the pain lasted several weeks and is unsure of the exact location of where the pain was. EXAM: 2D DIGITAL DIAGNOSTIC LEFT MAMMOGRAM WITH CAD AND ADJUNCT TOMO ULTRASOUND LEFT BREAST COMPARISON:  Previous exam(s). ACR Breast Density Category c: The breast tissue is heterogeneously dense, which may obscure small masses. FINDINGS: No suspicious mass, calcifications, or other abnormality is identified within the left breast. Mammographic images were processed with CAD. On physical exam, no discrete mass is felt in the area of concern within the left breast from 2-3 o'clock Targeted ultrasound of the left breast was performed from 2-3 o'clock demonstrating no suspicious cystic or solid sonographic finding in the area of concern. IMPRESSION: No mammographic or sonographic evidence of malignancy. RECOMMENDATION: Bilateral screening mammogram in 5 months (June 2018). I have discussed the findings and recommendations with the patient. Results were also provided in writing at the conclusion of the visit. If applicable, a reminder letter will be sent to the patient regarding the next appointment. BI-RADS CATEGORY  1: Negative. Electronically Signed   By: Pamelia Hoit M.D.   On: 05/14/2016 16:09       Assessment & Plan:   Problem List Items Addressed This Visit    Abnormal liver function test    Felt to have fatty liver.  Worked up by GI.  Follow liver panel.        Relevant Orders   Hepatic function panel   Anxiety    Stable on current regimen.  Continue f/u with psychiatry.        Breast cancer, right ([place])    Mammogram 10/18/15 - birads II.        CARCINOMA, THYROID GLAND, PAPILLARY    On thyroid replacement.  Follow tsh.        Relevant Orders   TSH   GERD (gastroesophageal  reflux disease)    Controlled on protonix.  Follow.  Hypercholesterolemia    Low cholesterol diet and exercise.  Follow lipid panel.        Relevant Orders   Lipid panel   Basic metabolic panel   Mild depression ([place])    Followed by psychiatry.  On lexapro.        Palpitations    No increased heart rate or palpitations.  Just saw cardiology.  Stable.        Stress    On lexapro.  Doing better.  Follow.        Vitamin D deficiency    Continue vitamin D supplements.  Follow.       Relevant Orders   VITAMIN D 25 Hydroxy (Vit-D Deficiency, Fractures)    Other Visit Diagnoses    Encounter for immunization       Relevant Orders   Flu Vaccine QUAD 36+ mos IM (Completed)       [name], MD

## 2016-06-13 ENCOUNTER — Other Ambulatory Visit: Payer: Self-pay | Admitting: Internal Medicine

## 2016-06-13 DIAGNOSIS — R790 Abnormal level of blood mineral: Secondary | ICD-10-CM

## 2016-06-15 ENCOUNTER — Encounter: Payer: Self-pay | Admitting: Internal Medicine

## 2016-06-15 NOTE — Assessment & Plan Note (Signed)
Stable on current regimen.  Continue f/u with psychiatry.

## 2016-06-15 NOTE — Assessment & Plan Note (Signed)
Felt to have fatty liver.  Worked up by GI.  Follow liver panel.

## 2016-06-15 NOTE — Assessment & Plan Note (Signed)
Continue vitamin D supplements.  Follow.   

## 2016-06-15 NOTE — Assessment & Plan Note (Signed)
No increased heart rate or palpitations.  Just saw cardiology.  Stable.

## 2016-06-15 NOTE — Assessment & Plan Note (Signed)
On lexapro.  Doing better.  Follow.   

## 2016-06-15 NOTE — Assessment & Plan Note (Signed)
On thyroid replacement.  Follow tsh.  

## 2016-06-15 NOTE — Assessment & Plan Note (Signed)
Low cholesterol diet and exercise.  Follow lipid panel.   

## 2016-06-15 NOTE — Assessment & Plan Note (Signed)
Controlled on protonix.  Follow.   

## 2016-06-15 NOTE — Assessment & Plan Note (Signed)
Followed by psychiatry.  On lexapro.   

## 2016-06-15 NOTE — Assessment & Plan Note (Signed)
Mammogram 10/18/15 - birads II.

## 2016-07-07 ENCOUNTER — Other Ambulatory Visit (INDEPENDENT_AMBULATORY_CARE_PROVIDER_SITE_OTHER): Payer: PPO

## 2016-07-07 DIAGNOSIS — E7800 Pure hypercholesterolemia, unspecified: Secondary | ICD-10-CM | POA: Diagnosis not present

## 2016-07-07 DIAGNOSIS — C73 Malignant neoplasm of thyroid gland: Secondary | ICD-10-CM

## 2016-07-07 DIAGNOSIS — E559 Vitamin D deficiency, unspecified: Secondary | ICD-10-CM | POA: Diagnosis not present

## 2016-07-07 DIAGNOSIS — R945 Abnormal results of liver function studies: Secondary | ICD-10-CM

## 2016-07-07 DIAGNOSIS — R7989 Other specified abnormal findings of blood chemistry: Secondary | ICD-10-CM

## 2016-07-07 LAB — BASIC METABOLIC PANEL
BUN: 20 mg/dL (ref 6–23)
CALCIUM: 9.1 mg/dL (ref 8.4–10.5)
CHLORIDE: 107 meq/L (ref 96–112)
CO2: 33 mEq/L — ABNORMAL HIGH (ref 19–32)
CREATININE: 1.12 mg/dL (ref 0.40–1.20)
GFR: 51.63 mL/min — ABNORMAL LOW (ref >60.00)
Glucose, Bld: 93 mg/dL (ref 70–99)
Potassium: 4.1 mEq/L (ref 3.5–5.1)
Sodium: 145 mEq/L (ref 135–145)

## 2016-07-07 LAB — LIPID PANEL
CHOLESTEROL: 208 mg/dL — AB (ref 0–200)
HDL: 49.9 mg/dL (ref >39.00)
LDL Cholesterol: 127 mg/dL — ABNORMAL HIGH (ref 0–99)
NonHDL: 158.03
Total CHOL/HDL Ratio: 4
Triglycerides: 155 mg/dL — ABNORMAL HIGH (ref 0.0–149.0)
VLDL: 31 mg/dL (ref 0.0–40.0)

## 2016-07-07 LAB — HEPATIC FUNCTION PANEL
ALT: 28 U/L (ref 0–35)
AST: 13 U/L (ref 0–37)
Albumin: 4.1 g/dL (ref 3.5–5.2)
Alkaline Phosphatase: 101 U/L (ref 39–117)
Bilirubin, Direct: 0.1 mg/dL (ref 0.0–0.3)
TOTAL PROTEIN: 6.5 g/dL (ref 6.0–8.3)
Total Bilirubin: 0.5 mg/dL (ref 0.2–1.2)

## 2016-07-07 LAB — VITAMIN D 25 HYDROXY (VIT D DEFICIENCY, FRACTURES): VITD: 12.12 ng/mL — ABNORMAL LOW (ref 30.00–100.00)

## 2016-07-07 LAB — TSH: TSH: 1.39 u[IU]/mL (ref 0.35–4.50)

## 2016-07-08 ENCOUNTER — Telehealth: Payer: Self-pay | Admitting: Internal Medicine

## 2016-07-08 ENCOUNTER — Other Ambulatory Visit: Payer: Self-pay | Admitting: Internal Medicine

## 2016-07-08 ENCOUNTER — Telehealth: Payer: Self-pay

## 2016-07-08 MED ORDER — ERGOCALCIFEROL 1.25 MG (50000 UT) PO CAPS: 50000.0000 [IU] | ORAL_CAPSULE | ORAL | 3 refills | Status: DC

## 2016-07-08 NOTE — Progress Notes (Signed)
Ergocalciferol rx sent in to pharmacy.

## 2016-07-08 NOTE — Telephone Encounter (Signed)
Left pt message asking to call Allison back directly at [phone] to schedule AWV. Thanks! °

## 2016-07-08 NOTE — Telephone Encounter (Signed)
Pt informed will call with any questions.

## 2016-07-08 NOTE — Telephone Encounter (Signed)
lmtrc

## 2016-07-08 NOTE — Telephone Encounter (Signed)
-----   Message from [name], MD sent at 07/08/2016  5:52 AM EST ----- Notify pt that her triglycerides have improved.  Good job!  Vitamin D level is low.  Needs to start prescription vitamin D.  I will send in rx for ergocalciferol to take once a week as directed.  We will follow.  Thyroid and liver function tests are wnl.

## 2016-08-26 ENCOUNTER — Telehealth: Payer: Self-pay

## 2016-08-26 NOTE — Telephone Encounter (Signed)
LVM for pt to call back and confirm AWV at 3pm on 09/08/16. Right before appt with Dr. Nicki Reaper.

## 2016-08-26 NOTE — Telephone Encounter (Signed)
Fax received for 90day supply on following meds   Magnesium 400 1 po qd Last refill: 06-15-16 #30 5rf  Vitamin D  Last fill #4 with 3rf   Last o/v 06-09-16 f/u 09-08-16 Labs 07-07-16

## 2016-08-27 ENCOUNTER — Other Ambulatory Visit: Payer: Self-pay

## 2016-08-27 DIAGNOSIS — R790 Abnormal level of blood mineral: Secondary | ICD-10-CM

## 2016-08-27 MED ORDER — MAGNESIUM OXIDE 400 (241.3 MG) MG PO TABS: 400.0000 mg | ORAL_TABLET | Freq: Every day | ORAL | 1 refills | Status: DC

## 2016-08-27 NOTE — Telephone Encounter (Signed)
Patient informed repeated all instructions back to me. I have called in refill on magnesium

## 2016-08-27 NOTE — Telephone Encounter (Signed)
Can refill magnesium.  Regarding the prescription vitamin D, do not refill this.  Have her start vitamin D3 2000 units per day (is over the counter).  We will follow. Level.

## 2016-08-27 NOTE — Telephone Encounter (Signed)
Pt aware AWV at 3pm

## 2016-09-06 DIAGNOSIS — J0100 Acute maxillary sinusitis, unspecified: Secondary | ICD-10-CM | POA: Diagnosis not present

## 2016-09-07 ENCOUNTER — Encounter: Payer: Self-pay | Admitting: Internal Medicine

## 2016-09-08 ENCOUNTER — Encounter: Payer: Self-pay | Admitting: Internal Medicine

## 2016-09-08 ENCOUNTER — Ambulatory Visit (INDEPENDENT_AMBULATORY_CARE_PROVIDER_SITE_OTHER): Payer: PPO | Admitting: Internal Medicine

## 2016-09-08 VITALS — BP 110/72 | HR 83 | Temp 98.7°F | Resp 14 | Ht 64.0 in | Wt 144.0 lb

## 2016-09-08 DIAGNOSIS — Z0000 Encounter for general adult medical examination without abnormal findings: Secondary | ICD-10-CM | POA: Diagnosis not present

## 2016-09-08 DIAGNOSIS — R945 Abnormal results of liver function studies: Secondary | ICD-10-CM

## 2016-09-08 DIAGNOSIS — C73 Malignant neoplasm of thyroid gland: Secondary | ICD-10-CM

## 2016-09-08 DIAGNOSIS — Z85038 Personal history of other malignant neoplasm of large intestine: Secondary | ICD-10-CM

## 2016-09-08 DIAGNOSIS — F439 Reaction to severe stress, unspecified: Secondary | ICD-10-CM | POA: Diagnosis not present

## 2016-09-08 DIAGNOSIS — R739 Hyperglycemia, unspecified: Secondary | ICD-10-CM | POA: Diagnosis not present

## 2016-09-08 DIAGNOSIS — E559 Vitamin D deficiency, unspecified: Secondary | ICD-10-CM | POA: Diagnosis not present

## 2016-09-08 DIAGNOSIS — K219 Gastro-esophageal reflux disease without esophagitis: Secondary | ICD-10-CM | POA: Diagnosis not present

## 2016-09-08 DIAGNOSIS — F32A Depression, unspecified: Secondary | ICD-10-CM

## 2016-09-08 DIAGNOSIS — E7800 Pure hypercholesterolemia, unspecified: Secondary | ICD-10-CM

## 2016-09-08 DIAGNOSIS — C50911 Malignant neoplasm of unspecified site of right female breast: Secondary | ICD-10-CM

## 2016-09-08 DIAGNOSIS — R002 Palpitations: Secondary | ICD-10-CM

## 2016-09-08 DIAGNOSIS — F419 Anxiety disorder, unspecified: Secondary | ICD-10-CM

## 2016-09-08 DIAGNOSIS — R7989 Other specified abnormal findings of blood chemistry: Secondary | ICD-10-CM

## 2016-09-08 DIAGNOSIS — F320 Major depressive disorder, single episode, mild: Secondary | ICD-10-CM

## 2016-09-08 DIAGNOSIS — L989 Disorder of the skin and subcutaneous tissue, unspecified: Secondary | ICD-10-CM

## 2016-09-08 NOTE — Progress Notes (Addendum)
Subjective:   Debra Hale is a 66 y.o. female who presents for an Initial Medicare Annual Wellness Visit.  Review of Systems    No ROS.  Medicare Wellness Visit.  Cardiac Risk Factors include: advanced age (>28men, >67 women)     Objective:    Today's Vitals   09/08/16 1525  BP: 110/72  Pulse: 83  Resp: 14  Temp: 98.7 F (37.1 C)  TempSrc: Oral  SpO2: 97%  Weight: 144 lb (65.3 kg)  Height: 5\' 4"  (1.626 m)   Body mass index is 24.72 kg/m.   Current Medications (verified) Outpatient Encounter Prescriptions as of 09/08/2016  Medication Sig  . Azelastine-Fluticasone 137-50 MCG/ACT SUSP Two spray each nostril once a day  . clonazePAM (KLONOPIN) 1 MG tablet Take 1 mg by mouth 2 (two) times daily.   . ergocalciferol (VITAMIN D2) 50000 units capsule Take 1 capsule (50,000 Units total) by mouth once a week.  . escitalopram (LEXAPRO) 20 MG tablet Take 20 mg by mouth daily.    Marland Kitchen KLOR-CON 10 10 MEQ tablet TAKE 1 TABLET (10 MEQ TOTAL) BY MOUTH DAILY.  Marland Kitchen levothyroxine (SYNTHROID, LEVOTHROID) 88 MCG tablet TAKE 1 TABLET (88 MCG TOTAL) BY MOUTH DAILY BEFORE BREAKFAST.  Marland Kitchen loperamide (IMODIUM) 1 MG/5ML solution Take 1 mg by mouth as needed for diarrhea or loose stools.  . magnesium oxide (MAG-OX) 400 (241.3 Mg) MG tablet Take 1 tablet (400 mg total) by mouth daily.  . pantoprazole (PROTONIX) 40 MG tablet TAKE 1 TABLET BY MOUTH DAILY.  Marland Kitchen potassium chloride (K-DUR) 10 MEQ tablet Take 1 tablet (10 mEq total) by mouth daily.  Marland Kitchen sulfamethoxazole-trimethoprim (BACTRIM DS,SEPTRA DS) 800-160 MG tablet Take by mouth.  . [DISCONTINUED] bismuth subsalicylate (PEPTO-BISMOL) 262 MG/15ML suspension Take 15 mLs by mouth every 6 (six) hours as needed. Reported on 10/01/2015  . [DISCONTINUED] metoprolol succinate (TOPROL XL) 25 MG 24 hr tablet Take 0.5 tablets (12.5 mg total) by mouth daily. (Patient not taking: Reported on 09/08/2016)   No facility-administered encounter medications on file as of  09/08/2016.     Allergies (verified) Augmentin [amoxicillin-pot clavulanate]; Bentyl [dicyclomine hcl]; Ciprofloxacin; Codeine; Epinephrine; Flagyl [metronidazole]; Librax [chlordiazepoxide-clidinium]; Phenobarbital; Tetracyclines & related; and Ultram [tramadol]   History: Past Medical History:  Diagnosis Date  . Anxiety   . Breast cancer ([place]) 2009   right breast lumpectomy with rad tx  . Colon cancer ([place])    surgery with chemo and rad tx  . GERD (gastroesophageal reflux disease)   . Liver nodule    s/p negative biopsy  . Malignant neoplasm of thyroid gland ([place]) 2002   s/p surgery and XRT  . Nephrolithiasis    s/p lithotripsy  . Osteoporosis   . Other and unspecified hyperlipidemia   . Palpitations   . Personal history of malignant neoplasm of large intestine    carcinoma - cecum, s/p right laparoscopic colectomy - s/p chemotherapy and XRT  . Unspecified hereditary and idiopathic peripheral neuropathy    Past Surgical History:  Procedure Laterality Date  . APPENDECTOMY  1985  . BREAST BIOPSY Right 2009   positive  . BREAST BIOPSY Right 2009   negative  . BREAST LUMPECTOMY  2009   breast cancer  . CHOLECYSTECTOMY  1995  . DILATION AND CURETTAGE OF UTERUS  1990  . LAPAROSCOPIC PARTIAL COLECTOMY     stage 3-C carcinoma of the cecum, s/p chemotherapy and xrt  . LITHOTRIPSY    . THYROID LOBECTOMY  2002   s/p XRT  Family History  Problem Relation Age of Onset  . Stroke Mother     24s  . Lung cancer Father   . Prostate cancer Father   . Cancer Father     Colon  . Breast cancer Sister     16's  . Lung cancer Sister   . Breast cancer Maternal Aunt    Social History   Occupational History  . Not on file.   Social History Main Topics  . Smoking status: Never Smoker  . Smokeless tobacco: Never Used  . Alcohol use No  . Drug use: No  . Sexual activity: Not Currently    Tobacco Counseling Counseling given: Not Answered   Activities of Daily Living In  your present state of health, do you have any difficulty performing the following activities: 09/08/2016  Hearing? Y  Vision? N  Difficulty concentrating or making decisions? N  Walking or climbing stairs? N  Dressing or bathing? N  Doing errands, shopping? N  Preparing Food and eating ? N  Using the Toilet? N  In the past six months, have you accidently leaked urine? N  Do you have problems with loss of bowel control? N  Managing your Medications? N  Managing your Finances? N  Housekeeping or managing your Housekeeping? N  Some recent data might be hidden    Immunizations and Health Maintenance Immunization History  Administered Date(s) Administered  . Influenza Split 02/04/2012  . Influenza,inj,Quad PF,36+ Mos 01/23/2014, 06/09/2016  . Pneumococcal Conjugate-13 01/05/2012   Health Maintenance Due  Topic Date Due  . Hepatitis C Screening  1951/03/11  . TETANUS/TDAP  11/15/1968  . PNA vac Low Risk Adult (2 of 2 - PPSV23) 11/16/2014    Patient Care Team: [name], MD as PCP - General (Internal Medicine)  Indicate any recent Medical Services you may have received from other than [facility] providers in the past year (date may be approximate).     Assessment:   This is a routine wellness examination for [name]. The goal of the wellness visit is to assist the patient how to close the gaps in care and create a preventative care plan for the patient.   Taking calcium VIT D as appropriate/Osteoporosis reviewed.  Medications reviewed; taking without issues or barriers.  Safety issues reviewed; smoke detectors in the home. No firearms in the home. Wears seatbelts when driving or riding with others. Patient does wear sunscreen or protective clothing when in direct sunlight. No violence in the home.  Depression- PHQ 2 &9 complete.  Currently in treatment every 4-6 weeks with Dr. Camelia Eng. Hx of mild depression.   Patient is alert, normal appearance, oriented to  person/place/and time. Correctly identified the president of the [place], recall of 3/3 words, and performing simple calculations.  Patient displays appropriate judgement and can read correct time from watch face.  No new identified risk were noted.  No failures at ADL's or IADL's.   BMI- discussed the importance of a healthy diet, water intake and exercise. Educational material provided.   Dental- every six months.  Sleep patterns- Sleeps 8 hours at night.  Wakes feeling rested.  Pneumovax 23 and TDAP vaccine deferred per patient preference.  Follow up with insurance.  Educational material provided.  Patient Concerns: None at this time. Follow up with PCP as needed.  Hearing/Vision screen Hearing Screening Comments: Patient is able to hear conversational tones without difficulty.  Ringing in the ears. Audiology testing deferred per patient request. Vision Screening Comments: Followed by Selena Lesser  [facility] Wears corrective lenses reading glasses Cataract extraction, bilateral Visual acuity not assessed per patient preference since they have regular follow up with the ophthalmologist  Dietary issues and exercise activities discussed: Current Exercise Habits: The patient does not participate in regular exercise at present  Goals    . Increase physical activity          Stay active walk for exercise 3 days a week, 30 minutes      Depression Screen PHQ 2/9 Scores 09/08/2016 03/19/2016 01/28/2015 09/13/2014 05/25/2013  PHQ - 2 Score - 0 6 3 6   PHQ- 9 Score - - 12 9 12   Exception Documentation Other- indicate reason in comment box - - - -  Not completed Currently in treatment with Dr. Camelia Eng every 4-6 weeks - - - -    Fall Risk Fall Risk  09/08/2016 03/19/2016 01/28/2015 09/13/2014 05/25/2013  Falls in the past year? No No No No No    Cognitive Function: MMSE - Mini Mental State Exam 09/08/2016  Orientation to time 5  Orientation to Place 5  Registration 3  Attention/ Calculation 5    Recall 3  Language- name 2 objects 2  Language- repeat 1  Language- follow 3 step command 3  Language- read & follow direction 1  Write a sentence 1  Copy design 1  Total score 30        Screening Tests Health Maintenance  Topic Date Due  . Hepatitis C Screening  06/23/1950  . TETANUS/TDAP  11/15/1968  . PNA vac Low Risk Adult (2 of 2 - PPSV23) 11/16/2014  . MAMMOGRAM  11/11/2016  . INFLUENZA VACCINE  12/02/2016  . COLONOSCOPY  08/01/2017  . DEXA SCAN  Completed      Plan:     End of life planning; Advanced aging; Advanced directives discussed.  No HCPOA/Living Will.  Additional information provided to help them start the conversation with family.  Copy of HCPOA/Living Will requested upon completion. Time spent on this topic is 25 minutes.  I have personally reviewed and noted the following in the patient's chart:   . Medical and social history . Use of alcohol, tobacco or illicit drugs  . Current medications and supplements . Functional ability and status . Nutritional status . Physical activity . Advanced directives . List of other physicians . Hospitalizations, surgeries, and ER visits in previous 12 months . Vitals . Screenings to include cognitive, depression, and falls . Referrals and appointments  In addition, I have reviewed and discussed with patient certain preventive protocols, quality metrics, and best practice recommendations. A written personalized care plan for preventive services as well as general preventive health recommendations were provided to patient.     Varney Biles, LPN   [id number]/0/3546    Reviewed above information.  Agree with plan.  Dr Nicki Reaper

## 2016-09-08 NOTE — Patient Instructions (Addendum)
  These are the goals we discussed: Goals    . Increase physical activity          Stay active walk for exercise 3 days a week, 30 minutes       This is a list of the screening recommended for you and due dates:  Health Maintenance  Topic Date Due  .  Hepatitis C: One time screening is recommended by Center for Disease Control  (CDC) for  adults born from [birth year] through 1966.   1951-02-10  . Tetanus Vaccine  11/15/1968  . Pneumonia vaccines (2 of 2 - PPSV23) 11/16/2014  . Mammogram  11/11/2016  . Flu Shot  12/02/2016  . Colon Cancer Screening  08/01/2017  . DEXA scan (bone density measurement)  Completed

## 2016-09-08 NOTE — Progress Notes (Signed)
Patient ID: Debra Hale, female   DOB: October 17, 1950, 66 y.o.   MRN: [id number]   Subjective:    Patient ID: Debra Hale, female    DOB: 12/13/1950, 66 y.o.   MRN: [id number]  HPI  Patient here for a scheduled follow up.  She reports she is doing relatively well.  Was recently diagnosed with sinus infection.  Placed on bactrim  Still taking.  Feels better.  Trying to stay active.  No chest pain.  No sob.  No acid reflux.  No abdominal pain.  Bowels doing better.  No increased palpitations.  Request referral to dermatology - skin check.  Had f/u left breast mammogram and ultrasound in 05/2016.  Recommended f/u bilateral mammogram in 10/2016.     Past Medical History:  Diagnosis Date  . Anxiety   . Breast cancer ([place]) 2009   right breast lumpectomy with rad tx  . Colon cancer ([place])    surgery with chemo and rad tx  . GERD (gastroesophageal reflux disease)   . Liver nodule    s/p negative biopsy  . Malignant neoplasm of thyroid gland ([place]) 2002   s/p surgery and XRT  . Nephrolithiasis    s/p lithotripsy  . Osteoporosis   . Other and unspecified hyperlipidemia   . Palpitations   . Personal history of malignant neoplasm of large intestine    carcinoma - cecum, s/p right laparoscopic colectomy - s/p chemotherapy and XRT  . Unspecified hereditary and idiopathic peripheral neuropathy    Past Surgical History:  Procedure Laterality Date  . APPENDECTOMY  1985  . BREAST BIOPSY Right 2009   positive  . BREAST BIOPSY Right 2009   negative  . BREAST LUMPECTOMY  2009   breast cancer  . CHOLECYSTECTOMY  1995  . DILATION AND CURETTAGE OF UTERUS  1990  . LAPAROSCOPIC PARTIAL COLECTOMY     stage 3-C carcinoma of the cecum, s/p chemotherapy and xrt  . LITHOTRIPSY    . THYROID LOBECTOMY  2002   s/p XRT   Family History  Problem Relation Age of Onset  . Stroke Mother        71s  . Lung cancer Father   . Prostate cancer Father   . Cancer Father        Colon  . Breast cancer Sister        54's  . Lung cancer Sister   . Breast cancer Maternal Aunt    Social History   Social History  . Marital status: Divorced    Spouse name: N/A  . Number of children: 0  . Years of education: N/A   Social History Main Topics  . Smoking status: Never Smoker  . Smokeless tobacco: Never Used  . Alcohol use No  . Drug use: No  . Sexual activity: Not Currently   Other Topics Concern  . None   Social History Narrative  . None    Outpatient Encounter Prescriptions as of 09/08/2016  Medication Sig  . Azelastine-Fluticasone 137-50 MCG/ACT SUSP Two spray each nostril once a day  . clonazePAM (KLONOPIN) 1 MG tablet Take 1 mg by mouth 2 (two) times daily.   . ergocalciferol (VITAMIN D2) 50000 units capsule Take 1 capsule (50,000 Units total) by mouth once a week.  . escitalopram (LEXAPRO) 20 MG tablet Take 20 mg by mouth daily.    Marland Kitchen KLOR-CON 10 10 MEQ tablet TAKE 1 TABLET (10 MEQ TOTAL) BY MOUTH DAILY.  Marland Kitchen levothyroxine (SYNTHROID, LEVOTHROID) 88 MCG  tablet TAKE 1 TABLET (88 MCG TOTAL) BY MOUTH DAILY BEFORE BREAKFAST.  Marland Kitchen loperamide (IMODIUM) 1 MG/5ML solution Take 1 mg by mouth as needed for diarrhea or loose stools.  . magnesium oxide (MAG-OX) 400 (241.3 Mg) MG tablet Take 1 tablet (400 mg total) by mouth daily.  . pantoprazole (PROTONIX) 40 MG tablet TAKE 1 TABLET BY MOUTH DAILY.  Marland Kitchen potassium chloride (K-DUR) 10 MEQ tablet Take 1 tablet (10 mEq total) by mouth daily.  Marland Kitchen sulfamethoxazole-trimethoprim (BACTRIM DS,SEPTRA DS) 800-160 MG tablet Take by mouth.  . [DISCONTINUED] bismuth subsalicylate (PEPTO-BISMOL) 262 MG/15ML suspension Take 15 mLs by mouth every 6 (six) hours as needed. Reported on 10/01/2015  . [DISCONTINUED] metoprolol succinate (TOPROL XL) 25 MG 24 hr tablet Take 0.5 tablets (12.5 mg total) by mouth daily. (Patient not taking: Reported on 09/08/2016)   No facility-administered encounter medications on file as of 09/08/2016.     Review of Systems  Constitutional: Negative  for appetite change and unexpected weight change.  HENT: Negative for sore throat.        Sinus symptoms better on bactrim.   Respiratory: Negative for cough, chest tightness and shortness of breath.   Cardiovascular: Negative for chest pain, palpitations and leg swelling.  Gastrointestinal: Negative for abdominal pain, diarrhea, nausea and vomiting.  Genitourinary: Negative for difficulty urinating and dysuria.  Musculoskeletal: Negative for back pain and joint swelling.  Skin: Negative for color change and rash.  Neurological: Negative for dizziness, light-headedness and headaches.  Psychiatric/Behavioral: Negative for agitation and dysphoric mood.       Objective:    Physical Exam  Constitutional: She appears well-developed and well-nourished. No distress.  HENT:  Nose: Nose normal.  Mouth/Throat: Oropharynx is clear and moist.  Neck: Neck supple. No thyromegaly present.  Cardiovascular: Normal rate and regular rhythm.   Pulmonary/Chest: Breath sounds normal. No respiratory distress. She has no wheezes.  Abdominal: Soft. Bowel sounds are normal. There is no tenderness.  Musculoskeletal: She exhibits no edema or tenderness.  Lymphadenopathy:    She has no cervical adenopathy.  Skin: No rash noted. No erythema.  Psychiatric: She has a normal mood and affect. Her behavior is normal.    BP 110/72   Pulse 83   Temp 98.7 F (37.1 C) (Oral)   Resp 14   Ht 5\' 4"  (1.626 m)   Wt 144 lb (65.3 kg)   SpO2 97%   BMI 24.72 kg/m  Wt Readings from Last 3 Encounters:  09/08/16 144 lb (65.3 kg)  06/09/16 142 lb 6.4 oz (64.6 kg)  05/19/16 142 lb 4 oz (64.5 kg)     Lab Results  Component Value Date   WBC 4.1 01/21/2016   HGB 14.4 01/21/2016   HCT 42.1 01/21/2016   PLT 228.0 01/21/2016   GLUCOSE 93 07/07/2016   CHOL 208 (H) 07/07/2016   TRIG 155.0 (H) 07/07/2016   HDL 49.90 07/07/2016   LDLDIRECT 126.0 07/11/2015   LDLCALC 127 (H) 07/07/2016   ALT 28 07/07/2016   AST 13  07/07/2016   NA 145 07/07/2016   K 4.1 07/07/2016   CL 107 07/07/2016   CREATININE 1.12 07/07/2016   BUN 20 07/07/2016   CO2 33 (H) 07/07/2016   TSH 1.39 07/07/2016   HGBA1C 5.2 08/30/2013    US Breast Ltd Uni Left Inc Axilla  Result Date: 05/14/2016 CLINICAL DATA:  66 year old female referred for left breast pain from 2-3 o'clock. The patient states that she no longer has the pain. She thinks  that the pain lasted several weeks and is unsure of the exact location of where the pain was. EXAM: 2D DIGITAL DIAGNOSTIC LEFT MAMMOGRAM WITH CAD AND ADJUNCT TOMO ULTRASOUND LEFT BREAST COMPARISON:  Previous exam(s). ACR Breast Density Category c: The breast tissue is heterogeneously dense, which may obscure small masses. FINDINGS: No suspicious mass, calcifications, or other abnormality is identified within the left breast. Mammographic images were processed with CAD. On physical exam, no discrete mass is felt in the area of concern within the left breast from 2-3 o'clock Targeted ultrasound of the left breast was performed from 2-3 o'clock demonstrating no suspicious cystic or solid sonographic finding in the area of concern. IMPRESSION: No mammographic or sonographic evidence of malignancy. RECOMMENDATION: Bilateral screening mammogram in 5 months (June 2018). I have discussed the findings and recommendations with the patient. Results were also provided in writing at the conclusion of the visit. If applicable, a reminder letter will be sent to the patient regarding the next appointment. BI-RADS CATEGORY  1: Negative. Electronically Signed   By: Pamelia Hoit M.D.   On: 05/14/2016 16:09   Mm Diag Breast Tomo Uni Left  Result Date: 05/14/2016 CLINICAL DATA:  66 year old female referred for left breast pain from 2-3 o'clock. The patient states that she no longer has the pain. She thinks that the pain lasted several weeks and is unsure of the exact location of where the pain was. EXAM: 2D DIGITAL DIAGNOSTIC LEFT  MAMMOGRAM WITH CAD AND ADJUNCT TOMO ULTRASOUND LEFT BREAST COMPARISON:  Previous exam(s). ACR Breast Density Category c: The breast tissue is heterogeneously dense, which may obscure small masses. FINDINGS: No suspicious mass, calcifications, or other abnormality is identified within the left breast. Mammographic images were processed with CAD. On physical exam, no discrete mass is felt in the area of concern within the left breast from 2-3 o'clock Targeted ultrasound of the left breast was performed from 2-3 o'clock demonstrating no suspicious cystic or solid sonographic finding in the area of concern. IMPRESSION: No mammographic or sonographic evidence of malignancy. RECOMMENDATION: Bilateral screening mammogram in 5 months (June 2018). I have discussed the findings and recommendations with the patient. Results were also provided in writing at the conclusion of the visit. If applicable, a reminder letter will be sent to the patient regarding the next appointment. BI-RADS CATEGORY  1: Negative. Electronically Signed   By: Pamelia Hoit M.D.   On: 05/14/2016 16:09       Assessment & Plan:   Problem List Items Addressed This Visit    Abnormal liver function test    Follow liver panel.        Anxiety    Stable on current regimen.  Continue f/u with podiatry.        Breast cancer, right ([facility])    Mammogram - left mammo and ultrsound - Birads I.  Recommended f/u bilateral mammogram in 10/2016.        Relevant Medications   sulfamethoxazole-trimethoprim (BACTRIM DS,SEPTRA DS) 800-160 MG tablet   CARCINOMA, THYROID GLAND, PAPILLARY    On thyroid replacement.  Follow tsh.        Relevant Medications   sulfamethoxazole-trimethoprim (BACTRIM DS,SEPTRA DS) 800-160 MG tablet   GERD (gastroesophageal reflux disease)    Controlled on protonix.  Follow.       History of malignant neoplasm of large intestine    Colonoscopy 07/2014.  f/u colonoscopy in 07/2017.        Hypercholesterolemia    Low  cholesterol diet and  exercise.  Follow lipid panel.        Relevant Orders   Hepatic function panel   Lipid panel   CBC with Differential/Platelet   Basic metabolic panel   Mild depression ([place])    Followed by psychiatry.  On lexapro.        Palpitations    Not an issue for her now.  Continues f/u with cardiology.        Stress    Increased stress with her living situation.  Overall she feelss he is handling things relatively well.  Does not feel needs any further intervention.  Follow.  Continue f/u with psychiatry.        Vitamin D deficiency    Continue vitamin D supplements.         Other Visit Diagnoses    Encounter for Medicare annual wellness exam    -  Primary   Skin lesion       wants skin survey.  wants to see Dr Kellie Moor.     Relevant Medications   sulfamethoxazole-trimethoprim (BACTRIM DS,SEPTRA DS) 800-160 MG tablet   Other Relevant Orders   Ambulatory referral to Dermatology   Hyperglycemia       Relevant Orders   Hemoglobin A1c       [name], MD

## 2016-09-13 ENCOUNTER — Encounter: Payer: Self-pay | Admitting: Internal Medicine

## 2016-09-13 NOTE — Assessment & Plan Note (Signed)
Continue vitamin D supplements.  

## 2016-09-13 NOTE — Assessment & Plan Note (Signed)
Stable on current regimen.  Continue f/u with podiatry.

## 2016-09-13 NOTE — Assessment & Plan Note (Signed)
Low cholesterol diet and exercise.  Follow lipid panel.   

## 2016-09-13 NOTE — Assessment & Plan Note (Signed)
Followed by psychiatry.  On lexapro.

## 2016-09-13 NOTE — Assessment & Plan Note (Signed)
Follow liver panel.  

## 2016-09-13 NOTE — Assessment & Plan Note (Signed)
Mammogram - left mammo and ultrsound - Birads I.  Recommended f/u bilateral mammogram in 10/2016.

## 2016-09-13 NOTE — Assessment & Plan Note (Signed)
Controlled on protonix.  Follow.   

## 2016-09-13 NOTE — Assessment & Plan Note (Signed)
On thyroid replacement.  Follow tsh.  

## 2016-09-13 NOTE — Assessment & Plan Note (Signed)
Increased stress with her living situation.  Overall she feelss he is handling things relatively well.  Does not feel needs any further intervention.  Follow.  Continue f/u with psychiatry.

## 2016-09-13 NOTE — Assessment & Plan Note (Signed)
Not an issue for her now.  Continues f/u with cardiology.

## 2016-09-13 NOTE — Assessment & Plan Note (Signed)
Colonoscopy 07/2014.  f/u colonoscopy in 07/2017.

## 2016-10-08 ENCOUNTER — Ambulatory Visit
Admission: RE | Admit: 2016-10-08 | Discharge: 2016-10-08 | Disposition: A | Discharge status: Home / Self Care | Payer: PPO | Source: Ambulatory Visit | Attending: Oncology | Admitting: Oncology

## 2016-10-08 DIAGNOSIS — Z1231 Encounter for screening mammogram for malignant neoplasm of breast: Secondary | ICD-10-CM | POA: Insufficient documentation

## 2016-10-08 DIAGNOSIS — Z853 Personal history of malignant neoplasm of breast: Secondary | ICD-10-CM | POA: Insufficient documentation

## 2016-10-08 DIAGNOSIS — C50911 Malignant neoplasm of unspecified site of right female breast: Secondary | ICD-10-CM

## 2016-10-08 HISTORY — DX: Personal history of irradiation: Z92.3

## 2016-10-08 HISTORY — DX: Personal history of antineoplastic chemotherapy: Z92.21

## 2016-10-12 ENCOUNTER — Other Ambulatory Visit: Payer: Self-pay | Admitting: Internal Medicine

## 2016-10-14 ENCOUNTER — Other Ambulatory Visit: Payer: Self-pay | Admitting: Internal Medicine

## 2016-10-23 DIAGNOSIS — S61411A Laceration without foreign body of right hand, initial encounter: Secondary | ICD-10-CM | POA: Diagnosis not present

## 2016-11-06 ENCOUNTER — Other Ambulatory Visit: Payer: Self-pay | Admitting: Internal Medicine

## 2016-11-06 NOTE — Telephone Encounter (Signed)
Given that she has been on the prescription since 07/2016, she can start on otc vitamin D once this rx is complete.  Start vitamin D3 2000 units per day.

## 2016-11-06 NOTE — Telephone Encounter (Signed)
On 07/07/16 Vitamin D level was low (12.12) advised to take weekly supplements, do you want them to continue our switch to OTC?

## 2016-11-22 ENCOUNTER — Other Ambulatory Visit: Payer: Self-pay | Admitting: Internal Medicine

## 2016-12-10 ENCOUNTER — Other Ambulatory Visit (INDEPENDENT_AMBULATORY_CARE_PROVIDER_SITE_OTHER): Payer: PPO

## 2016-12-10 DIAGNOSIS — E7800 Pure hypercholesterolemia, unspecified: Secondary | ICD-10-CM

## 2016-12-10 DIAGNOSIS — R739 Hyperglycemia, unspecified: Secondary | ICD-10-CM | POA: Diagnosis not present

## 2016-12-10 LAB — CBC WITH DIFFERENTIAL/PLATELET
BASOS PCT: 0.5 % (ref 0.0–3.0)
Basophils Absolute: 0 10*3/uL (ref 0.0–0.1)
EOS PCT: 1.4 % (ref 0.0–5.0)
Eosinophils Absolute: 0 10*3/uL (ref 0.0–0.7)
HEMATOCRIT: 42.2 % (ref 36.0–46.0)
HEMOGLOBIN: 14.3 g/dL (ref 12.0–15.0)
LYMPHS PCT: 25.2 % (ref 12.0–46.0)
Lymphs Abs: 0.9 10*3/uL (ref 0.7–4.0)
MCHC: 33.8 g/dL (ref 30.0–36.0)
MCV: 88.5 fl (ref 78.0–100.0)
Monocytes Absolute: 0.2 10*3/uL (ref 0.1–1.0)
Monocytes Relative: 6.9 % (ref 3.0–12.0)
NEUTROS ABS: 2.3 10*3/uL (ref 1.4–7.7)
Neutrophils Relative %: 66 % (ref 43.0–77.0)
PLATELETS: 183 10*3/uL (ref 150.0–400.0)
RBC: 4.77 Mil/uL (ref 3.87–5.11)
RDW: 14.3 % (ref 11.5–15.5)
WBC: 3.5 10*3/uL — AB (ref 4.0–10.5)

## 2016-12-10 LAB — BASIC METABOLIC PANEL
BUN: 17 mg/dL (ref 6–23)
CALCIUM: 9.2 mg/dL (ref 8.4–10.5)
CO2: 29 mEq/L (ref 19–32)
CREATININE: 1.16 mg/dL (ref 0.40–1.20)
Chloride: 107 mEq/L (ref 96–112)
GFR: 49.52 mL/min — AB (ref >60.00)
GLUCOSE: 105 mg/dL — AB (ref 70–99)
Potassium: 3.7 mEq/L (ref 3.5–5.1)
Sodium: 140 mEq/L (ref 135–145)

## 2016-12-10 LAB — LIPID PANEL
Cholesterol: 198 mg/dL (ref 0–200)
HDL: 46.3 mg/dL (ref >39.00)
LDL CALC: 116 mg/dL — AB (ref 0–99)
NonHDL: 151.61
TRIGLYCERIDES: 180 mg/dL — AB (ref 0.0–149.0)
Total CHOL/HDL Ratio: 4
VLDL: 36 mg/dL (ref 0.0–40.0)

## 2016-12-10 LAB — HEPATIC FUNCTION PANEL
ALK PHOS: 96 U/L (ref 39–117)
ALT: 41 U/L — AB (ref 0–35)
AST: 16 U/L (ref 0–37)
Albumin: 4 g/dL (ref 3.5–5.2)
BILIRUBIN DIRECT: 0.1 mg/dL (ref 0.0–0.3)
Total Bilirubin: 0.7 mg/dL (ref 0.2–1.2)
Total Protein: 6.6 g/dL (ref 6.0–8.3)

## 2016-12-10 LAB — HEMOGLOBIN A1C: Hgb A1c MFr Bld: 5.3 % (ref 4.6–6.5)

## 2016-12-15 ENCOUNTER — Encounter: Payer: Self-pay | Admitting: Internal Medicine

## 2016-12-15 ENCOUNTER — Ambulatory Visit (INDEPENDENT_AMBULATORY_CARE_PROVIDER_SITE_OTHER): Payer: PPO | Admitting: Internal Medicine

## 2016-12-15 VITALS — BP 118/68 | HR 88 | Temp 98.6°F | Resp 14 | Ht 64.0 in | Wt 144.0 lb

## 2016-12-15 DIAGNOSIS — K219 Gastro-esophageal reflux disease without esophagitis: Secondary | ICD-10-CM

## 2016-12-15 DIAGNOSIS — F320 Major depressive disorder, single episode, mild: Secondary | ICD-10-CM

## 2016-12-15 DIAGNOSIS — C73 Malignant neoplasm of thyroid gland: Secondary | ICD-10-CM | POA: Diagnosis not present

## 2016-12-15 DIAGNOSIS — F439 Reaction to severe stress, unspecified: Secondary | ICD-10-CM | POA: Diagnosis not present

## 2016-12-15 DIAGNOSIS — D72819 Decreased white blood cell count, unspecified: Secondary | ICD-10-CM | POA: Diagnosis not present

## 2016-12-15 DIAGNOSIS — F419 Anxiety disorder, unspecified: Secondary | ICD-10-CM | POA: Diagnosis not present

## 2016-12-15 DIAGNOSIS — E7800 Pure hypercholesterolemia, unspecified: Secondary | ICD-10-CM

## 2016-12-15 DIAGNOSIS — R945 Abnormal results of liver function studies: Secondary | ICD-10-CM

## 2016-12-15 DIAGNOSIS — R002 Palpitations: Secondary | ICD-10-CM | POA: Diagnosis not present

## 2016-12-15 DIAGNOSIS — R7989 Other specified abnormal findings of blood chemistry: Secondary | ICD-10-CM

## 2016-12-15 DIAGNOSIS — Z9109 Other allergy status, other than to drugs and biological substances: Secondary | ICD-10-CM | POA: Diagnosis not present

## 2016-12-15 DIAGNOSIS — Z85038 Personal history of other malignant neoplasm of large intestine: Secondary | ICD-10-CM | POA: Diagnosis not present

## 2016-12-15 DIAGNOSIS — E559 Vitamin D deficiency, unspecified: Secondary | ICD-10-CM | POA: Diagnosis not present

## 2016-12-15 DIAGNOSIS — F32A Depression, unspecified: Secondary | ICD-10-CM

## 2016-12-15 NOTE — Progress Notes (Signed)
Pre-visit discussion using our clinic review tool. No additional management support is needed unless otherwise documented below in the visit note.  

## 2016-12-15 NOTE — Progress Notes (Signed)
Patient ID: Debra Hale, female   DOB: 13-Aug-1950, 66 y.o.   MRN: [id number]   Subjective:    Patient ID: Debra Hale, female    DOB: 07/03/1950, 66 y.o.   MRN: [id number]  HPI  Patient here for a scheduled follow up.  She reports her boyfriend's father passed away recently.  States it occurred very fast.  Having increased stress related to this. Discussed with her today.  She desires no further w/up at this time.  Sees Dr Clovis Riley.  He was recently trying to taper her clonazepam.  She does not feel she can do this now.  Plans to call and discuss with Dr Clovis Riley.  No chest pain.  Breathing stable.  Eating.  No nausea or vomiting.  Bowels moving.  Discussed her recent lab results.  Discussed diet and exercise.     Past Medical History:  Diagnosis Date  . Anxiety   . Breast cancer ([place]) 2009   right breast lumpectomy with rad tx  . Colon cancer ([place])    surgery with chemo and rad tx  . GERD (gastroesophageal reflux disease)   . Liver nodule    s/p negative biopsy  . Malignant neoplasm of thyroid gland ([place]) 2002   s/p surgery and XRT  . Nephrolithiasis    s/p lithotripsy  . Osteoporosis   . Other and unspecified hyperlipidemia   . Palpitations   . Personal history of chemotherapy   . Personal history of malignant neoplasm of large intestine    carcinoma - cecum, s/p right laparoscopic colectomy - s/p chemotherapy and XRT  . Personal history of radiation therapy   . Unspecified hereditary and idiopathic peripheral neuropathy    Past Surgical History:  Procedure Laterality Date  . APPENDECTOMY  1985  . BREAST BIOPSY Right 2009   positive  . BREAST BIOPSY Right 2009   negative  . BREAST LUMPECTOMY Right 2009   breast cancer  . CHOLECYSTECTOMY  1995  . DILATION AND CURETTAGE OF UTERUS  1990  . LAPAROSCOPIC PARTIAL COLECTOMY     stage 3-C carcinoma of the cecum, s/p chemotherapy and xrt  . LITHOTRIPSY    . THYROID LOBECTOMY  2002   s/p XRT   Family History  Problem  Relation Age of Onset  . Stroke Mother        28s  . Lung cancer Father   . Prostate cancer Father   . Cancer Father        Colon  . Breast cancer Sister        32's  . Lung cancer Sister   . Breast cancer Maternal Aunt    Social History   Social History  . Marital status: Divorced    Spouse name: N/A  . Number of children: 0  . Years of education: N/A   Social History Main Topics  . Smoking status: Never Smoker  . Smokeless tobacco: Never Used  . Alcohol use No  . Drug use: No  . Sexual activity: Not Currently   Other Topics Concern  . None   Social History Narrative  . None    Outpatient Encounter Prescriptions as of 12/15/2016  Medication Sig  . clonazePAM (KLONOPIN) 1 MG tablet Take 1 mg by mouth 2 (two) times daily.   Marland Kitchen DYMISTA 137-50 MCG/ACT SUSP USE 2 SPRAYS IN EACH NOSTRIL TWICE DAILY  . ergocalciferol (VITAMIN D2) 50000 units capsule Take 1 capsule (50,000 Units total) by mouth once a week.  . escitalopram (LEXAPRO)  20 MG tablet Take 20 mg by mouth daily.    Marland Kitchen KLOR-CON 10 10 MEQ tablet TAKE 1 TABLET (10 MEQ TOTAL) BY MOUTH DAILY.  Marland Kitchen levothyroxine (SYNTHROID, LEVOTHROID) 88 MCG tablet TAKE 1 TABLET (88 MCG TOTAL) BY MOUTH DAILY BEFORE BREAKFAST.  Marland Kitchen loperamide (IMODIUM) 1 MG/5ML solution Take 1 mg by mouth as needed for diarrhea or loose stools.  . magnesium oxide (MAG-OX) 400 (241.3 Mg) MG tablet Take 1 tablet (400 mg total) by mouth daily.  . pantoprazole (PROTONIX) 40 MG tablet TAKE 1 TABLET BY MOUTH DAILY.  Marland Kitchen potassium chloride (K-DUR) 10 MEQ tablet Take 1 tablet (10 mEq total) by mouth daily.   No facility-administered encounter medications on file as of 12/15/2016.     Review of Systems  Constitutional: Negative for appetite change and unexpected weight change.  HENT: Negative for congestion and sinus pressure.   Respiratory: Negative for cough, chest tightness and shortness of breath.   Cardiovascular: Negative for chest pain and leg swelling.    Gastrointestinal: Negative for abdominal pain, nausea and vomiting.  Genitourinary: Negative for difficulty urinating and dysuria.  Musculoskeletal: Negative for joint swelling and myalgias.  Skin: Negative for color change and rash.  Neurological: Negative for dizziness, light-headedness and headaches.  Psychiatric/Behavioral: Negative for agitation.       Increased stress and some anxiety as outlined.  Sees Dr Clovis Riley.         Objective:    Physical Exam  Constitutional: She appears well-developed and well-nourished. No distress.  HENT:  Nose: Nose normal.  Mouth/Throat: Oropharynx is clear and moist.  Neck: Neck supple.  Cardiovascular: Normal rate and regular rhythm.   Pulmonary/Chest: Breath sounds normal. No respiratory distress. She has no wheezes.  Abdominal: Soft. Bowel sounds are normal. There is no tenderness.  Musculoskeletal: She exhibits no edema or tenderness.  Lymphadenopathy:    She has no cervical adenopathy.  Skin: No rash noted. No erythema.  Psychiatric: She has a normal mood and affect. Her behavior is normal.    BP 118/68 (BP Location: Left Arm, Patient Position: Sitting, Cuff Size: Normal)   Pulse 88   Temp 98.6 F (37 C) (Oral)   Resp 14   Ht 5\' 4"  (1.626 m)   Wt 144 lb (65.3 kg)   SpO2 96%   BMI 24.72 kg/m  Wt Readings from Last 3 Encounters:  12/15/16 144 lb (65.3 kg)  09/08/16 144 lb (65.3 kg)  06/09/16 142 lb 6.4 oz (64.6 kg)     Lab Results  Component Value Date   WBC 3.5 (L) 12/10/2016   HGB 14.3 12/10/2016   HCT 42.2 12/10/2016   PLT 183.0 12/10/2016   GLUCOSE 105 (H) 12/10/2016   CHOL 198 12/10/2016   TRIG 180.0 (H) 12/10/2016   HDL 46.30 12/10/2016   LDLDIRECT 126.0 07/11/2015   LDLCALC 116 (H) 12/10/2016   ALT 41 (H) 12/10/2016   AST 16 12/10/2016   NA 140 12/10/2016   K 3.7 12/10/2016   CL 107 12/10/2016   CREATININE 1.16 12/10/2016   BUN 17 12/10/2016   CO2 29 12/10/2016   TSH 1.39 07/07/2016   HGBA1C 5.3  12/10/2016    Mm Screening Breast Tomo Bilateral  Result Date: 10/08/2016 CLINICAL DATA:  Screening. EXAM: 2D DIGITAL SCREENING BILATERAL MAMMOGRAM WITH CAD AND ADJUNCT TOMO COMPARISON:  Previous exam(s). ACR Breast Density Category c: The breast tissue is heterogeneously dense, which may obscure small masses. FINDINGS: There are no findings suspicious for malignancy. Images were  processed with CAD. IMPRESSION: No mammographic evidence of malignancy. A result letter of this screening mammogram will be mailed directly to the patient. RECOMMENDATION: Screening mammogram in one year. (Code:SM-B-01Y) BI-RADS CATEGORY  1: Negative. Electronically Signed   By: Everlean Alstrom M.D.   On: 10/08/2016 12:39       Assessment & Plan:   Problem List Items Addressed This Visit    Abnormal liver function test    Recent check - overall stable.  Follow.        Anxiety    Increased recently.  Sees Dr Clovis Riley.  On clonazepam and lexapro.  Feels lexapro works well for her.  Does not feel can taper clonazepam now.  Plans to call and discuss with Dr Clovis Riley.        CARCINOMA, THYROID GLAND, PAPILLARY    On thyroid replacement.  Follow tsh.        Environmental allergies    Some increase recently.  Plans to use her nasal spray, etc on a regular basis.  Follow.        GERD (gastroesophageal reflux disease)    Controlled on protonix.        History of malignant neoplasm of large intestine    Colonoscopy 07/2014.  F/u due 07/2017.        Hypercholesterolemia    Low cholesterol diet and exercise.  Follow lipid panel.  Recent LDL 116.       Mild depression ([place])    Followed by psychiatry. On lexapro. Plans to call and discuss current issues with Dr Clovis Riley.  No suicidal ideations.        Palpitations    Occasionally will noticed with increased stress and anxiety.  Overall stable.  Follow.        Stress    Increased stress as outlined.  Sees Dr Clovis Riley.  On lexapro and clonazepam.  She plans to call  and discussed her current issues with Dr Viviano Simas.  No suicidal ideations.        Vitamin D deficiency    On vitamin D supplements.  Recheck vitamin D level.         Other Visit Diagnoses    Leukopenia, unspecified type    -  Primary   schedule for f/u cbc.    Relevant Orders   CBC with Differential/Platelet       [name], MD

## 2016-12-17 ENCOUNTER — Encounter: Payer: Self-pay | Admitting: Internal Medicine

## 2016-12-17 NOTE — Assessment & Plan Note (Signed)
Some increase recently.  Plans to use her nasal spray, etc on a regular basis.  Follow.

## 2016-12-17 NOTE — Assessment & Plan Note (Signed)
On vitamin D supplements.  Recheck vitamin D level.

## 2016-12-17 NOTE — Assessment & Plan Note (Signed)
Increased stress as outlined.  Sees Dr Clovis Riley.  On lexapro and clonazepam.  She plans to call and discussed her current issues with Dr Viviano Simas.  No suicidal ideations.

## 2016-12-17 NOTE — Assessment & Plan Note (Signed)
Recent check - overall stable.  Follow.

## 2016-12-17 NOTE — Assessment & Plan Note (Signed)
Followed by psychiatry. On lexapro. Plans to call and discuss current issues with Dr Clovis Riley.  No suicidal ideations.

## 2016-12-17 NOTE — Assessment & Plan Note (Signed)
Occasionally will noticed with increased stress and anxiety.  Overall stable.  Follow.

## 2016-12-17 NOTE — Assessment & Plan Note (Signed)
Controlled on protonix.   

## 2016-12-17 NOTE — Assessment & Plan Note (Signed)
Colonoscopy 07/2014.  F/u due 07/2017.

## 2016-12-17 NOTE — Assessment & Plan Note (Signed)
On thyroid replacement.  Follow tsh.  

## 2016-12-17 NOTE — Assessment & Plan Note (Signed)
Low cholesterol diet and exercise.  Follow lipid panel.  Recent LDL 116.

## 2016-12-17 NOTE — Assessment & Plan Note (Signed)
Increased recently.  Sees Dr Clovis Riley.  On clonazepam and lexapro.  Feels lexapro works well for her.  Does not feel can taper clonazepam now.  Plans to call and discuss with Dr Clovis Riley.

## 2017-01-04 ENCOUNTER — Ambulatory Visit: Payer: Self-pay | Admitting: Oncology

## 2017-01-04 ENCOUNTER — Other Ambulatory Visit: Payer: Self-pay

## 2017-01-18 ENCOUNTER — Other Ambulatory Visit: Payer: Self-pay

## 2017-01-18 ENCOUNTER — Ambulatory Visit: Payer: Self-pay | Admitting: Oncology

## 2017-01-19 NOTE — Progress Notes (Signed)
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  INTERVAL HISTORY:  Patient returns to clinic today for routine yearly follow-up. She offers no specific complaints today. She had a Mammogram in June which was BIRADS 1. She is highly anxious. She has no neurologic complaints. She denies any fevers. She has no chest pain or shortness of breath. She denies any nausea or vomiting. She has no urinary complaints. Patient otherwise feels well and offers no further specific complaints.  REVIEW OF SYSTEMS:   Review of Systems  Constitutional: Negative for fever, malaise/fatigue and weight loss.  HENT: Negative for ear pain and sore throat.   Respiratory: Negative.  Negative for cough and shortness of breath.   Cardiovascular: Negative.  Negative for chest pain.  Gastrointestinal: Negative for abdominal pain and diarrhea.  Genitourinary: Negative.   Musculoskeletal: Negative.   Neurological: Negative for weakness.  Psychiatric/Behavioral: The patient is nervous/anxious.     As per HPI. Otherwise, a complete review of systems is negatve.  PAST MEDICAL HISTORY: Past Medical History:  Diagnosis Date  . Anxiety   . Breast cancer ([place]) 2009   right breast lumpectomy with rad tx  . Colon cancer ([place])    surgery with chemo and rad tx  . GERD (gastroesophageal reflux disease)   . Liver nodule    s/p negative biopsy  . Malignant neoplasm of thyroid gland ([place]) 2002   s/p surgery and XRT  . Nephrolithiasis    s/p lithotripsy  . Osteoporosis   . Other and unspecified hyperlipidemia   . Palpitations   . Personal history of chemotherapy   . Personal history of malignant neoplasm of large intestine    carcinoma -  cecum, s/p right laparoscopic colectomy - s/p chemotherapy and XRT  . Personal history of radiation therapy   . Unspecified hereditary and idiopathic peripheral neuropathy     PAST SURGICAL HISTORY: Past Surgical History:  Procedure Laterality Date  . APPENDECTOMY  1985  . BREAST BIOPSY Right 2009   positive  . BREAST BIOPSY Right 2009   negative  . BREAST LUMPECTOMY Right 2009   breast cancer  . CHOLECYSTECTOMY  1995  . DILATION AND CURETTAGE OF UTERUS  1990  . LAPAROSCOPIC PARTIAL COLECTOMY     stage 3-C carcinoma of the cecum, s/p chemotherapy and xrt  . LITHOTRIPSY    . THYROID LOBECTOMY  2002   s/p XRT    FAMILY HISTORY: Family History  Problem Relation Age of Onset  . Stroke Mother        35s  . Lung cancer Father   . Prostate cancer Father   . Cancer Father        Colon  . Breast cancer Sister        70's  . Lung cancer Sister   . Breast cancer Maternal Aunt        ADVANCED DIRECTIVES (Y/N):  N   HEALTH MAINTENANCE: Social History  Substance Use Topics  . Smoking status: Never Smoker  . Smokeless tobacco: Never Used  . Alcohol use No     Colonoscopy:  PAP:  Bone density:  Lipid panel:  Allergies  Allergen Reactions  . Augmentin [Amoxicillin-Pot Clavulanate] Diarrhea  . Bentyl [Dicyclomine Hcl]   . Ciprofloxacin Diarrhea  .  Codeine Other (See Comments)    dizziness    . Demeclocycline Other (See Comments)    Throat swells  . Epinephrine   . Flagyl [Metronidazole]   . Librax [Chlordiazepoxide-Clidinium]   . Phenobarbital   . Tetracyclines & Related Other (See Comments)    Throat swells   . Ultram [Tramadol] Other (See Comments)    Sick feeling    Current Outpatient Prescriptions  Medication Sig Dispense Refill  . clonazePAM (KLONOPIN) 1 MG tablet Take 1 mg by mouth 2 (two) times daily.     Marland Kitchen DYMISTA 137-50 MCG/ACT SUSP USE 2 SPRAYS IN EACH NOSTRIL TWICE DAILY 23 g 2  . ergocalciferol (VITAMIN D2) 50000 units capsule Take 1 capsule  (50,000 Units total) by mouth once a week. 4 capsule 3  . escitalopram (LEXAPRO) 20 MG tablet Take 20 mg by mouth daily.      Marland Kitchen KLOR-CON 10 10 MEQ tablet TAKE 1 TABLET (10 MEQ TOTAL) BY MOUTH DAILY. 90 tablet 1  . levothyroxine (SYNTHROID, LEVOTHROID) 88 MCG tablet TAKE 1 TABLET (88 MCG TOTAL) BY MOUTH DAILY BEFORE BREAKFAST. 90 tablet 1  . loperamide (IMODIUM) 1 MG/5ML solution Take 1 mg by mouth as needed for diarrhea or loose stools.    . magnesium oxide (MAG-OX) 400 (241.3 Mg) MG tablet Take 1 tablet (400 mg total) by mouth daily. 90 tablet 1  . pantoprazole (PROTONIX) 40 MG tablet TAKE 1 TABLET BY MOUTH DAILY. 90 tablet 2  . potassium chloride (K-DUR) 10 MEQ tablet Take 1 tablet (10 mEq total) by mouth daily. 30 tablet 1   No current facility-administered medications for this visit.     OBJECTIVE: Vitals:   01/21/17 1537  BP: 122/80  Pulse: 84  Resp: 18  Temp: (!) 97 F (36.1 C)     Body mass index is 25.28 kg/m.    ECOG FS:0 - Asymptomatic  General: Well-developed, well-nourished, no acute distress. Eyes: Pink conjunctiva, anicteric sclera. HEENT: Normocephalic, moist mucous membranes, clear oropharnyx. Breasts: Normal breast exam. Right breast small scar from lumpectomy noted. Lungs: Clear to auscultation bilaterally. Heart: Regular rate and rhythm. No rubs, murmurs, or gallops. Abdomen: Soft, nontender, nondistended. No organomegaly noted, normoactive bowel sounds. Musculoskeletal: No edema, cyanosis, or clubbing. Neuro: Alert, answering all questions appropriately. Cranial nerves grossly intact. Skin: No rashes or petechiae noted. Psych: Normal affect.   LAB RESULTS:  Lab Results  Component Value Date   NA 140 12/10/2016   K 3.7 12/10/2016   CL 107 12/10/2016   CO2 29 12/10/2016   GLUCOSE 105 (H) 12/10/2016   BUN 17 12/10/2016   CREATININE 1.16 12/10/2016   CALCIUM 9.2 12/10/2016   PROT 6.6 12/10/2016   ALBUMIN 4.0 12/10/2016   AST 16 12/10/2016   ALT 41  (H) 12/10/2016   ALKPHOS 96 12/10/2016   BILITOT 0.7 12/10/2016   GFRNONAA 54 (L) 01/02/2016   GFRAA >60 01/02/2016    Lab Results  Component Value Date   WBC 3.5 (L) 12/10/2016   NEUTROABS 2.3 12/10/2016   HGB 14.3 12/10/2016   HCT 42.2 12/10/2016   MCV 88.5 12/10/2016   PLT 183.0 12/10/2016     STUDIES: No results found.  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  PLAN:    1. 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 Patient completed 5 years  of hormonal therapy in approximately June 2015. Currently, she has no evidence of disease. Her most recent mammogram on October 07, 2016 was reported as BI-RADS 1. Repeat in June 2019. Return to clinic in 1 year for routine evaluation.  Patient expressed understanding and was in agreement with this plan. She also understands that She can call clinic at any time with any questions, concerns, or complaints.   Cancer Staging Breast cancer, right [facility]) Staging form: Breast, AJCC 7th Edition - Clinical stage from 01/07/2016: Stage IA (T1c, N0, M0) - Signed by Lloyd Huger, MD on 01/07/2016   Jacquelin Hawking, NP   01/22/2017 1:06 PM

## 2017-01-21 ENCOUNTER — Inpatient Hospital Stay: Payer: PPO | Attending: Oncology | Admitting: Oncology

## 2017-01-21 ENCOUNTER — Inpatient Hospital Stay: Payer: PPO

## 2017-01-21 VITALS — BP 122/80 | HR 84 | Temp 97.0°F | Resp 18 | Wt 147.3 lb

## 2017-01-21 DIAGNOSIS — E785 Hyperlipidemia, unspecified: Secondary | ICD-10-CM | POA: Diagnosis not present

## 2017-01-21 DIAGNOSIS — Z8042 Family history of malignant neoplasm of prostate: Secondary | ICD-10-CM | POA: Insufficient documentation

## 2017-01-21 DIAGNOSIS — Z9221 Personal history of antineoplastic chemotherapy: Secondary | ICD-10-CM | POA: Insufficient documentation

## 2017-01-21 DIAGNOSIS — Z923 Personal history of irradiation: Secondary | ICD-10-CM

## 2017-01-21 DIAGNOSIS — Z801 Family history of malignant neoplasm of trachea, bronchus and lung: Secondary | ICD-10-CM | POA: Diagnosis not present

## 2017-01-21 DIAGNOSIS — G629 Polyneuropathy, unspecified: Secondary | ICD-10-CM

## 2017-01-21 DIAGNOSIS — F419 Anxiety disorder, unspecified: Secondary | ICD-10-CM | POA: Insufficient documentation

## 2017-01-21 DIAGNOSIS — Z803 Family history of malignant neoplasm of breast: Secondary | ICD-10-CM | POA: Diagnosis not present

## 2017-01-21 DIAGNOSIS — Z87442 Personal history of urinary calculi: Secondary | ICD-10-CM

## 2017-01-21 DIAGNOSIS — Z85038 Personal history of other malignant neoplasm of large intestine: Secondary | ICD-10-CM | POA: Diagnosis not present

## 2017-01-21 DIAGNOSIS — Z85850 Personal history of malignant neoplasm of thyroid: Secondary | ICD-10-CM | POA: Diagnosis not present

## 2017-01-21 DIAGNOSIS — K219 Gastro-esophageal reflux disease without esophagitis: Secondary | ICD-10-CM | POA: Insufficient documentation

## 2017-01-21 DIAGNOSIS — Z853 Personal history of malignant neoplasm of breast: Secondary | ICD-10-CM | POA: Insufficient documentation

## 2017-01-21 DIAGNOSIS — Z79899 Other long term (current) drug therapy: Secondary | ICD-10-CM | POA: Diagnosis not present

## 2017-01-21 DIAGNOSIS — C50911 Malignant neoplasm of unspecified site of right female breast: Secondary | ICD-10-CM

## 2017-01-21 DIAGNOSIS — Z800 Family history of malignant neoplasm of digestive organs: Secondary | ICD-10-CM | POA: Diagnosis not present

## 2017-01-21 DIAGNOSIS — K769 Liver disease, unspecified: Secondary | ICD-10-CM | POA: Insufficient documentation

## 2017-01-21 DIAGNOSIS — M810 Age-related osteoporosis without current pathological fracture: Secondary | ICD-10-CM | POA: Diagnosis not present

## 2017-01-21 NOTE — Progress Notes (Signed)
Patient denies any concerns today.  

## 2017-01-26 DIAGNOSIS — L820 Inflamed seborrheic keratosis: Secondary | ICD-10-CM | POA: Diagnosis not present

## 2017-01-26 DIAGNOSIS — L538 Other specified erythematous conditions: Secondary | ICD-10-CM | POA: Diagnosis not present

## 2017-01-26 DIAGNOSIS — D225 Melanocytic nevi of trunk: Secondary | ICD-10-CM | POA: Diagnosis not present

## 2017-01-26 DIAGNOSIS — B078 Other viral warts: Secondary | ICD-10-CM | POA: Diagnosis not present

## 2017-01-26 DIAGNOSIS — L298 Other pruritus: Secondary | ICD-10-CM | POA: Diagnosis not present

## 2017-01-26 DIAGNOSIS — D2261 Melanocytic nevi of right upper limb, including shoulder: Secondary | ICD-10-CM | POA: Diagnosis not present

## 2017-01-26 DIAGNOSIS — L821 Other seborrheic keratosis: Secondary | ICD-10-CM | POA: Diagnosis not present

## 2017-01-26 DIAGNOSIS — D3613 Benign neoplasm of peripheral nerves and autonomic nervous system of lower limb, including hip: Secondary | ICD-10-CM | POA: Diagnosis not present

## 2017-03-20 ENCOUNTER — Other Ambulatory Visit: Payer: Self-pay | Admitting: Internal Medicine

## 2017-03-20 DIAGNOSIS — R790 Abnormal level of blood mineral: Secondary | ICD-10-CM

## 2017-05-02 DIAGNOSIS — H66001 Acute suppurative otitis media without spontaneous rupture of ear drum, right ear: Secondary | ICD-10-CM | POA: Diagnosis not present

## 2017-05-02 DIAGNOSIS — B9689 Other specified bacterial agents as the cause of diseases classified elsewhere: Secondary | ICD-10-CM | POA: Diagnosis not present

## 2017-05-02 DIAGNOSIS — J019 Acute sinusitis, unspecified: Secondary | ICD-10-CM | POA: Diagnosis not present

## 2017-05-04 DIAGNOSIS — J189 Pneumonia, unspecified organism: Secondary | ICD-10-CM

## 2017-05-04 HISTORY — DX: Pneumonia, unspecified organism: J18.9

## 2017-05-26 ENCOUNTER — Other Ambulatory Visit: Payer: Self-pay | Admitting: Internal Medicine

## 2017-05-27 ENCOUNTER — Other Ambulatory Visit: Payer: Self-pay | Admitting: *Deleted

## 2017-05-27 MED ORDER — LEVOTHYROXINE SODIUM 88 MCG PO TABS: 88.0000 ug | ORAL_TABLET | Freq: Every day | ORAL | 1 refills | Status: DC

## 2017-05-28 ENCOUNTER — Telehealth: Payer: Self-pay | Admitting: Internal Medicine

## 2017-05-28 NOTE — Telephone Encounter (Unsigned)
Copied from [place]. Topic: Appointment Scheduling - Scheduling Inquiry for Clinic >> May 28, 2017 10:30 AM Arletha Grippe wrote: Reason for CRM: pt needs to have labs fro potassium and magnesium. Cb # [phone]. She wants labs on tues or thurs

## 2017-05-31 ENCOUNTER — Other Ambulatory Visit: Payer: Self-pay | Admitting: *Deleted

## 2017-05-31 MED ORDER — LEVOTHYROXINE SODIUM 88 MCG PO TABS: 88.0000 ug | ORAL_TABLET | Freq: Every day | ORAL | 1 refills | Status: DC

## 2017-05-31 NOTE — Telephone Encounter (Signed)
Tried to change to Rx from print to normal- would not e-scribe. Called to pharmacy as prescribed by provider.

## 2017-05-31 NOTE — Telephone Encounter (Signed)
Copied from [place] [id number]. Topic: Quick Communication - Rx Refill/Question >> May 31, 2017  1:36 PM Percell Belt A wrote: Medication: levothyroxine (SYNTHROID, LEVOTHROID) 88 MCG tablet [[id number]]    Has the patient contacted their pharmacy? Yes   (Agent: If no, request that the patient contact the pharmacy for the refill.)   Preferred Pharmacy (with phone number or street name):pt called and said that pharmacy did not get this med on the 23rd?  [facility] on [address].   Agent: Please be advised that RX refills may take up to 3 business days. We ask that you follow-up with your pharmacy.

## 2017-07-15 ENCOUNTER — Ambulatory Visit (INDEPENDENT_AMBULATORY_CARE_PROVIDER_SITE_OTHER): Payer: PPO | Admitting: Internal Medicine

## 2017-07-15 ENCOUNTER — Encounter: Payer: Self-pay | Admitting: Internal Medicine

## 2017-07-15 VITALS — BP 146/90 | HR 84 | Temp 98.5°F | Resp 18 | Wt 148.8 lb

## 2017-07-15 DIAGNOSIS — R945 Abnormal results of liver function studies: Secondary | ICD-10-CM

## 2017-07-15 DIAGNOSIS — R1084 Generalized abdominal pain: Secondary | ICD-10-CM | POA: Diagnosis not present

## 2017-07-15 DIAGNOSIS — R109 Unspecified abdominal pain: Secondary | ICD-10-CM

## 2017-07-15 DIAGNOSIS — E538 Deficiency of other specified B group vitamins: Secondary | ICD-10-CM

## 2017-07-15 DIAGNOSIS — R7989 Other specified abnormal findings of blood chemistry: Secondary | ICD-10-CM

## 2017-07-15 DIAGNOSIS — F32A Depression, unspecified: Secondary | ICD-10-CM

## 2017-07-15 DIAGNOSIS — F419 Anxiety disorder, unspecified: Secondary | ICD-10-CM

## 2017-07-15 DIAGNOSIS — F320 Major depressive disorder, single episode, mild: Secondary | ICD-10-CM

## 2017-07-15 LAB — CBC WITH DIFFERENTIAL/PLATELET
Basophils Absolute: 0 10*3/uL (ref 0.0–0.1)
Basophils Relative: 0.6 % (ref 0.0–3.0)
EOS ABS: 0 10*3/uL (ref 0.0–0.7)
Eosinophils Relative: 1.2 % (ref 0.0–5.0)
HEMATOCRIT: 42 % (ref 36.0–46.0)
HEMOGLOBIN: 14.4 g/dL (ref 12.0–15.0)
LYMPHS PCT: 30.6 % (ref 12.0–46.0)
Lymphs Abs: 1 10*3/uL (ref 0.7–4.0)
MCHC: 34.2 g/dL (ref 30.0–36.0)
MCV: 86.4 fl (ref 78.0–100.0)
Monocytes Absolute: 0.2 10*3/uL (ref 0.1–1.0)
Monocytes Relative: 7.1 % (ref 3.0–12.0)
NEUTROS ABS: 2 10*3/uL (ref 1.4–7.7)
Neutrophils Relative %: 60.5 % (ref 43.0–77.0)
PLATELETS: 181 10*3/uL (ref 150.0–400.0)
RBC: 4.87 Mil/uL (ref 3.87–5.11)
RDW: 13.9 % (ref 11.5–15.5)
WBC: 3.3 10*3/uL — AB (ref 4.0–10.5)

## 2017-07-15 LAB — VITAMIN B12: VITAMIN B 12: 189 pg/mL — AB (ref 211–911)

## 2017-07-15 LAB — HEPATIC FUNCTION PANEL
ALK PHOS: 91 U/L (ref 39–117)
ALT: 35 U/L (ref 0–35)
AST: 13 U/L (ref 0–37)
Albumin: 4 g/dL (ref 3.5–5.2)
BILIRUBIN DIRECT: 0.1 mg/dL (ref 0.0–0.3)
BILIRUBIN TOTAL: 0.5 mg/dL (ref 0.2–1.2)
TOTAL PROTEIN: 6.3 g/dL (ref 6.0–8.3)

## 2017-07-15 LAB — BASIC METABOLIC PANEL
BUN: 18 mg/dL (ref 6–23)
CHLORIDE: 105 meq/L (ref 96–112)
CO2: 31 mEq/L (ref 19–32)
CREATININE: 1.03 mg/dL (ref 0.40–1.20)
Calcium: 9.6 mg/dL (ref 8.4–10.5)
GFR: 56.69 mL/min — ABNORMAL LOW (ref >60.00)
GLUCOSE: 80 mg/dL (ref 70–99)
Potassium: 3.8 mEq/L (ref 3.5–5.1)
Sodium: 141 mEq/L (ref 135–145)

## 2017-07-15 LAB — LIPASE: Lipase: 30 U/L (ref 11.0–59.0)

## 2017-07-15 LAB — AMYLASE: AMYLASE: 31 U/L (ref 27–131)

## 2017-07-15 NOTE — Progress Notes (Signed)
Patient ID: Debra Hale, female   DOB: [birth year], 67 y.o.   MRN: [id number]   Subjective:    Patient ID: Debra Hale, female    DOB: [birth year], 67 y.o.   MRN: [id number]  HPI  Patient here as a work in with concerns regarding abdominal pain and bloating.  States symptoms have worsened over the past 6 months, but has had more flares over the last few weeks. No vomiting.  More eat, worsens.  Coffee aggravates.  Stools are more formed now.  No fever.  No chest pain or sob.   No increased acid reflux.     Past Medical History:  Diagnosis Date  . Anxiety   . Breast cancer ([place]) 2009   right breast lumpectomy with rad tx  . Colon cancer ([place])    surgery with chemo and rad tx  . GERD (gastroesophageal reflux disease)   . Liver nodule    s/p negative biopsy  . Malignant neoplasm of thyroid gland ([place]) 2002   s/p surgery and XRT  . Nephrolithiasis    s/p lithotripsy  . Osteoporosis   . Other and unspecified hyperlipidemia   . Palpitations   . Personal history of chemotherapy   . Personal history of malignant neoplasm of large intestine    carcinoma - cecum, s/p right laparoscopic colectomy - s/p chemotherapy and XRT  . Personal history of radiation therapy   . Unspecified hereditary and idiopathic peripheral neuropathy    Past Surgical History:  Procedure Laterality Date  . APPENDECTOMY  1985  . BREAST BIOPSY Right 2009   positive  . BREAST BIOPSY Right 2009   negative  . BREAST LUMPECTOMY Right 2009   breast cancer  . CHOLECYSTECTOMY  1995  . DILATION AND CURETTAGE OF UTERUS  1990  . LAPAROSCOPIC PARTIAL COLECTOMY     stage 3-C carcinoma of the cecum, s/p chemotherapy and xrt  . LITHOTRIPSY    . THYROID LOBECTOMY  2002   s/p XRT   Family History  Problem Relation Age of Onset  . Stroke Mother        18s  . Lung cancer Father   . Prostate cancer Father   . Cancer Father        Colon  . Breast cancer Sister        55's  . Lung cancer Sister   . Breast cancer  Maternal Aunt    Social History   Socioeconomic History  . Marital status: Divorced    Spouse name: None  . Number of children: 0  . Years of education: None  . Highest education level: None  Social Needs  . Financial resource strain: None  . Food insecurity - worry: None  . Food insecurity - inability: None  . Transportation needs - medical: None  . Transportation needs - non-medical: None  Occupational History  . None  Tobacco Use  . Smoking status: Never Smoker  . Smokeless tobacco: Never Used  Substance and Sexual Activity  . Alcohol use: No    Alcohol/week: 0.0 oz  . Drug use: No  . Sexual activity: Not Currently  Other Topics Concern  . None  Social History Narrative  . None    Outpatient Encounter Medications as of 07/15/2017  Medication Sig  . clonazePAM (KLONOPIN) 1 MG tablet Take 1 mg by mouth 2 (two) times daily.   Marland Kitchen DYMISTA 137-50 MCG/ACT SUSP USE 2 SPRAYS IN EACH NOSTRIL TWICE DAILY  . ergocalciferol (VITAMIN D2) 50000 units  capsule Take 1 capsule (50,000 Units total) by mouth once a week.  . escitalopram (LEXAPRO) 20 MG tablet Take 20 mg by mouth daily.    Marland Kitchen KLOR-CON 10 10 MEQ tablet TAKE 1 TABLET (10 MEQ TOTAL) BY MOUTH DAILY.  Marland Kitchen levothyroxine (SYNTHROID, LEVOTHROID) 88 MCG tablet Take 1 tablet (88 mcg total) by mouth daily before breakfast. TAKE 1 TABLET (88 MCG TOTAL) BY MOUTH DAILY BEFORE BREAKFAST. APPT NEEDED FOR FURTHER REFILLS  . levothyroxine (SYNTHROID, LEVOTHROID) 88 MCG tablet Take 1 tablet (88 mcg total) by mouth daily before breakfast. TAKE 1 TABLET (88 MCG TOTAL) BY MOUTH DAILY BEFORE BREAKFAST. APPT NEEDED FOR FURTHER REFILLS  . loperamide (IMODIUM) 1 MG/5ML solution Take 1 mg by mouth as needed for diarrhea or loose stools.  . magnesium oxide (MAG-OX) 400 MG tablet TAKE 1 TABLET (400 MG TOTAL) BY MOUTH DAILY.  . pantoprazole (PROTONIX) 40 MG tablet TAKE 1 TABLET BY MOUTH DAILY.  Marland Kitchen potassium chloride (K-DUR) 10 MEQ tablet Take 1 tablet (10 mEq  total) by mouth daily.   No facility-administered encounter medications on file as of 07/15/2017.     Review of Systems  Constitutional: Negative for appetite change and fever.  HENT: Negative for congestion and sinus pressure.   Respiratory: Negative for cough, chest tightness and shortness of breath.   Cardiovascular: Negative for chest pain, palpitations and leg swelling.  Gastrointestinal: Positive for abdominal pain. Negative for vomiting.       Stool previous loose.  More formed now.    Genitourinary: Negative for difficulty urinating and dysuria.  Musculoskeletal: Negative for joint swelling and myalgias.  Skin: Negative for color change and rash.  Neurological: Negative for dizziness, light-headedness and headaches.  Psychiatric/Behavioral: Negative for agitation and dysphoric mood.       Objective:    Physical Exam  Constitutional: She appears well-developed and well-nourished. No distress.  HENT:  Nose: Nose normal.  Mouth/Throat: Oropharynx is clear and moist.  Neck: Neck supple. No thyromegaly present.  Cardiovascular: Normal rate and regular rhythm.  Pulmonary/Chest: Breath sounds normal. No respiratory distress. She has no wheezes.  Abdominal: Soft. Bowel sounds are normal.  Minimal discomfort with palpation.    Musculoskeletal: She exhibits no edema or tenderness.  Lymphadenopathy:    She has no cervical adenopathy.  Skin: No rash noted. No erythema.  Psychiatric: She has a normal mood and affect. Her behavior is normal.    BP (!) 146/90 (BP Location: Left Arm, Patient Position: Sitting, Cuff Size: Normal)   Pulse 84   Temp 98.5 F (36.9 C) (Oral)   Resp 18   Wt 148 lb 12.8 oz (67.5 kg)   SpO2 96%   BMI 25.54 kg/m  Wt Readings from Last 3 Encounters:  07/15/17 148 lb 12.8 oz (67.5 kg)  01/21/17 147 lb 4.8 oz (66.8 kg)  12/15/16 144 lb (65.3 kg)     Lab Results  Component Value Date   WBC 3.3 (L) 07/15/2017   HGB 14.4 07/15/2017   HCT 42.0  07/15/2017   PLT 181.0 07/15/2017   GLUCOSE 80 07/15/2017   CHOL 198 12/10/2016   TRIG 180.0 (H) 12/10/2016   HDL 46.30 12/10/2016   LDLDIRECT 126.0 07/11/2015   LDLCALC 116 (H) 12/10/2016   ALT 35 07/15/2017   AST 13 07/15/2017   NA 141 07/15/2017   K 3.8 07/15/2017   CL 105 07/15/2017   CREATININE 1.03 07/15/2017   BUN 18 07/15/2017   CO2 31 07/15/2017   TSH 1.39  07/07/2016   HGBA1C 5.3 12/10/2016    Mm Screening Breast Tomo Bilateral  Result Date: 10/08/2016 CLINICAL DATA:  Screening. EXAM: 2D DIGITAL SCREENING BILATERAL MAMMOGRAM WITH CAD AND ADJUNCT TOMO COMPARISON:  Previous exam(s). ACR Breast Density Category c: The breast tissue is heterogeneously dense, which may obscure small masses. FINDINGS: There are no findings suspicious for malignancy. Images were processed with CAD. IMPRESSION: No mammographic evidence of malignancy. A result letter of this screening mammogram will be mailed directly to the patient. RECOMMENDATION: Screening mammogram in one year. (Code:SM-B-01Y) BI-RADS CATEGORY  1: Negative. Electronically Signed   By: Everlean Alstrom M.D.   On: 10/08/2016 12:39       Assessment & Plan:   Problem List Items Addressed This Visit    Abdominal pain - Primary    With persistent intermittent flares of pain as outlined.  Stool more formed now.  Still with bloating and increased gas and intermittent episodes of increased pain.  Will check labs including, liver panel, cbc, amylase and lipase.  Also check CT abdomen and pelvis.  May need referral back to GI.  Continue protonix.  Add probiotic.        Relevant Orders   CT Abdomen Pelvis W Contrast   CBC with Differential/Platelet (Completed)   Hepatic function panel (Completed)   Basic metabolic panel (Completed)   Amylase (Completed)   Lipase (Completed)   CT Abdomen Pelvis W Contrast   Abnormal liver function test    With history of abnormal liver function tests and now pain, will recheck liver panel.  Obtain CT  scan as outlined.        Anxiety    Followed by Dr Clovis Riley.  Doing well on current regimen.  Follow.        Mild depression ([place])    Followed by psychiatry.  Stable on current regimen.         Other Visit Diagnoses    B12 deficiency       Relevant Orders   Vitamin B12 (Completed)       [name], MD

## 2017-07-16 ENCOUNTER — Telehealth: Payer: Self-pay | Admitting: *Deleted

## 2017-07-16 NOTE — Telephone Encounter (Signed)
Copied from [place] [id number]. Topic: General - Call Back - No Documentation >> Jul 16, 2017 12:26 PM Ether Griffins B wrote: Reason for CRM: pt calling back returning call. Shes wondering if it is about lab work?

## 2017-07-17 ENCOUNTER — Other Ambulatory Visit: Payer: Self-pay | Admitting: Internal Medicine

## 2017-07-18 ENCOUNTER — Encounter: Payer: Self-pay | Admitting: Internal Medicine

## 2017-07-18 NOTE — Assessment & Plan Note (Signed)
With history of abnormal liver function tests and now pain, will recheck liver panel.  Obtain CT scan as outlined.

## 2017-07-18 NOTE — Assessment & Plan Note (Signed)
Followed by Dr Clovis Riley.  Doing well on current regimen.  Follow.

## 2017-07-18 NOTE — Assessment & Plan Note (Signed)
Followed by psychiatry.  Stable on current regimen.   

## 2017-07-18 NOTE — Assessment & Plan Note (Signed)
With persistent intermittent flares of pain as outlined.  Stool more formed now.  Still with bloating and increased gas and intermittent episodes of increased pain.  Will check labs including, liver panel, cbc, amylase and lipase.  Also check CT abdomen and pelvis.  May need referral back to GI.  Continue protonix.  Add probiotic.

## 2017-07-19 NOTE — Telephone Encounter (Signed)
Results given to pt

## 2017-07-21 ENCOUNTER — Ambulatory Visit
Admission: RE | Admit: 2017-07-21 | Discharge: 2017-07-21 | Disposition: A | Discharge status: Home / Self Care | Payer: PPO | Source: Ambulatory Visit | Attending: Internal Medicine | Admitting: Internal Medicine

## 2017-07-21 DIAGNOSIS — R197 Diarrhea, unspecified: Secondary | ICD-10-CM | POA: Diagnosis not present

## 2017-07-21 DIAGNOSIS — N200 Calculus of kidney: Secondary | ICD-10-CM | POA: Diagnosis not present

## 2017-07-21 DIAGNOSIS — I700 Atherosclerosis of aorta: Secondary | ICD-10-CM | POA: Diagnosis not present

## 2017-07-21 DIAGNOSIS — R109 Unspecified abdominal pain: Secondary | ICD-10-CM

## 2017-07-21 DIAGNOSIS — K760 Fatty (change of) liver, not elsewhere classified: Secondary | ICD-10-CM | POA: Diagnosis not present

## 2017-07-21 DIAGNOSIS — R1084 Generalized abdominal pain: Secondary | ICD-10-CM | POA: Insufficient documentation

## 2017-07-21 MED ORDER — IOPAMIDOL (ISOVUE-300) INJECTION 61%: 100.0000 mL | Freq: Once | INTRAVENOUS | Status: DC | PRN

## 2017-07-22 ENCOUNTER — Ambulatory Visit (INDEPENDENT_AMBULATORY_CARE_PROVIDER_SITE_OTHER): Payer: PPO

## 2017-07-22 DIAGNOSIS — E538 Deficiency of other specified B group vitamins: Secondary | ICD-10-CM

## 2017-07-22 MED ORDER — CYANOCOBALAMIN 1000 MCG/ML IJ SOLN
1000.0000 ug | Freq: Once | INTRAMUSCULAR | Status: AC
  Administered 2017-07-22: 1000 ug via INTRAMUSCULAR

## 2017-07-22 NOTE — Progress Notes (Signed)
Can see if we can get an earlier appt.  See me about this.

## 2017-07-22 NOTE — Progress Notes (Signed)
Patient comes in today for her first weekly vitamin B 12 injection. Administered in left deltoid IM. Patient tolerated well.  Patient notified of CT results. Patient states she has an appointment with GI at [facility] clinic next month but would like for [place] to try to schedule her for a sooner appointment. Patient states she is not sure what the appointment is for that she is scheduled for, she states she received a letter from [facility] stating they wanted her to follow up.

## 2017-07-26 DIAGNOSIS — N189 Chronic kidney disease, unspecified: Secondary | ICD-10-CM | POA: Diagnosis not present

## 2017-07-26 DIAGNOSIS — K760 Fatty (change of) liver, not elsewhere classified: Secondary | ICD-10-CM | POA: Diagnosis not present

## 2017-07-26 DIAGNOSIS — B9689 Other specified bacterial agents as the cause of diseases classified elsewhere: Secondary | ICD-10-CM | POA: Diagnosis not present

## 2017-07-26 DIAGNOSIS — J019 Acute sinusitis, unspecified: Secondary | ICD-10-CM | POA: Diagnosis not present

## 2017-07-29 ENCOUNTER — Ambulatory Visit (INDEPENDENT_AMBULATORY_CARE_PROVIDER_SITE_OTHER): Payer: PPO | Admitting: *Deleted

## 2017-07-29 DIAGNOSIS — E538 Deficiency of other specified B group vitamins: Secondary | ICD-10-CM

## 2017-07-30 DIAGNOSIS — E538 Deficiency of other specified B group vitamins: Secondary | ICD-10-CM | POA: Diagnosis not present

## 2017-07-30 MED ORDER — CYANOCOBALAMIN 1000 MCG/ML IJ SOLN
1000.0000 ug | Freq: Once | INTRAMUSCULAR | Status: AC
  Administered 2017-07-30: 1000 ug via INTRAMUSCULAR

## 2017-07-30 NOTE — Progress Notes (Signed)
Patient presented for B 12 injection to left deltoid, patient voiced no concerns nor showed any signs of distress during injection. 

## 2017-07-31 ENCOUNTER — Other Ambulatory Visit: Payer: Self-pay | Admitting: Internal Medicine

## 2017-08-05 ENCOUNTER — Ambulatory Visit (INDEPENDENT_AMBULATORY_CARE_PROVIDER_SITE_OTHER): Payer: PPO

## 2017-08-05 ENCOUNTER — Ambulatory Visit: Payer: Self-pay

## 2017-08-05 DIAGNOSIS — E538 Deficiency of other specified B group vitamins: Secondary | ICD-10-CM | POA: Diagnosis not present

## 2017-08-05 MED ORDER — CYANOCOBALAMIN 1000 MCG/ML IJ SOLN
1000.0000 ug | Freq: Once | INTRAMUSCULAR | Status: AC
  Administered 2017-08-05: 1000 ug via INTRAMUSCULAR

## 2017-08-05 NOTE — Progress Notes (Addendum)
b12 injection given in left deltoid. Patient tolerated well.  Reviewed.  Dr Scott 

## 2017-08-12 ENCOUNTER — Ambulatory Visit (INDEPENDENT_AMBULATORY_CARE_PROVIDER_SITE_OTHER): Payer: PPO

## 2017-08-12 DIAGNOSIS — E538 Deficiency of other specified B group vitamins: Secondary | ICD-10-CM

## 2017-08-12 MED ORDER — CYANOCOBALAMIN 1000 MCG/ML IJ SOLN
1000.0000 ug | Freq: Once | INTRAMUSCULAR | Status: AC
  Administered 2017-08-12: 1000 ug via INTRAMUSCULAR

## 2017-08-12 NOTE — Progress Notes (Addendum)
Patient comes in today for Vitamin B 12 injection. Administered in right deltoid IM. Patient tolerated well.  Reviewed.  Dr Nicki Reaper

## 2017-08-26 DIAGNOSIS — Z86010 Personal history of colonic polyps: Secondary | ICD-10-CM | POA: Diagnosis not present

## 2017-08-26 DIAGNOSIS — K580 Irritable bowel syndrome with diarrhea: Secondary | ICD-10-CM | POA: Diagnosis not present

## 2017-08-26 DIAGNOSIS — Z85038 Personal history of other malignant neoplasm of large intestine: Secondary | ICD-10-CM | POA: Diagnosis not present

## 2017-08-26 DIAGNOSIS — K760 Fatty (change of) liver, not elsewhere classified: Secondary | ICD-10-CM | POA: Diagnosis not present

## 2017-08-26 DIAGNOSIS — Z85850 Personal history of malignant neoplasm of thyroid: Secondary | ICD-10-CM | POA: Diagnosis not present

## 2017-08-26 DIAGNOSIS — Z853 Personal history of malignant neoplasm of breast: Secondary | ICD-10-CM | POA: Diagnosis not present

## 2017-09-02 ENCOUNTER — Other Ambulatory Visit
Admission: RE | Admit: 2017-09-02 | Discharge: 2017-09-02 | Disposition: A | Discharge status: Home / Self Care | Payer: PPO | Source: Ambulatory Visit | Attending: Nurse Practitioner | Admitting: Nurse Practitioner

## 2017-09-02 DIAGNOSIS — K580 Irritable bowel syndrome with diarrhea: Secondary | ICD-10-CM | POA: Diagnosis not present

## 2017-09-02 LAB — GASTROINTESTINAL PANEL BY PCR, STOOL (REPLACES STOOL CULTURE)

## 2017-09-02 LAB — C DIFFICILE QUICK SCREEN W PCR REFLEX
C Diff antigen: NEGATIVE
C Diff interpretation: NOT DETECTED
C Diff toxin: NEGATIVE

## 2017-09-09 ENCOUNTER — Ambulatory Visit: Payer: Self-pay

## 2017-09-14 ENCOUNTER — Ambulatory Visit (INDEPENDENT_AMBULATORY_CARE_PROVIDER_SITE_OTHER): Payer: PPO | Admitting: Internal Medicine

## 2017-09-14 VITALS — BP 120/74 | HR 82 | Temp 98.3°F | Resp 16 | Wt 150.8 lb

## 2017-09-14 DIAGNOSIS — E538 Deficiency of other specified B group vitamins: Secondary | ICD-10-CM

## 2017-09-14 DIAGNOSIS — E7800 Pure hypercholesterolemia, unspecified: Secondary | ICD-10-CM | POA: Diagnosis not present

## 2017-09-14 DIAGNOSIS — F320 Major depressive disorder, single episode, mild: Secondary | ICD-10-CM

## 2017-09-14 DIAGNOSIS — E559 Vitamin D deficiency, unspecified: Secondary | ICD-10-CM | POA: Diagnosis not present

## 2017-09-14 DIAGNOSIS — Z853 Personal history of malignant neoplasm of breast: Secondary | ICD-10-CM | POA: Diagnosis not present

## 2017-09-14 DIAGNOSIS — Z85038 Personal history of other malignant neoplasm of large intestine: Secondary | ICD-10-CM

## 2017-09-14 DIAGNOSIS — R945 Abnormal results of liver function studies: Secondary | ICD-10-CM | POA: Diagnosis not present

## 2017-09-14 DIAGNOSIS — C73 Malignant neoplasm of thyroid gland: Secondary | ICD-10-CM | POA: Diagnosis not present

## 2017-09-14 DIAGNOSIS — R7989 Other specified abnormal findings of blood chemistry: Secondary | ICD-10-CM

## 2017-09-14 DIAGNOSIS — F419 Anxiety disorder, unspecified: Secondary | ICD-10-CM

## 2017-09-14 DIAGNOSIS — K219 Gastro-esophageal reflux disease without esophagitis: Secondary | ICD-10-CM

## 2017-09-14 DIAGNOSIS — F32A Depression, unspecified: Secondary | ICD-10-CM

## 2017-09-14 MED ORDER — CYANOCOBALAMIN 1000 MCG/ML IJ SOLN
1000.0000 ug | Freq: Once | INTRAMUSCULAR | Status: AC
Start: 2017-09-14 — End: 2017-09-14
  Administered 2017-09-14: 1000 ug via INTRAMUSCULAR

## 2017-09-14 NOTE — Progress Notes (Signed)
Patient ID: Debra Hale, female   DOB: 06-Jun-1950, 67 y.o.   MRN: [id number]   Subjective:    Patient ID: Debra Hale, female    DOB: 09-05-1950, 67 y.o.   MRN: [id number]  HPI  Patient here for a scheduled follow up.  Has been having issues with her bowels.  Loose stool/diarrhea.  Saw GI 08/26/17.  Planning for colonoscopy.  Also had stool studies to confirm no infection.  States bowels are better today.  No abdominal pain.  Eating.  No nausea or vomiting.  No chest pain or tightness.  Handling stress relatively well.     Past Medical History:  Diagnosis Date  . Anxiety   . Breast cancer ([place]) 2009   right breast lumpectomy with rad tx  . Colon cancer ([place])    surgery with chemo and rad tx  . GERD (gastroesophageal reflux disease)   . Liver nodule    s/p negative biopsy  . 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 Nephrolithiasis    s/p lithotripsy  . Osteoporosis   . Other and unspecified hyperlipidemia   . Palpitations   . Personal history of chemotherapy   . Personal history of malignant neoplasm of large intestine    carcinoma - cecum, s/p right laparoscopic colectomy - s/p chemotherapy and XRT  . Personal history of radiation therapy   . Unspecified hereditary and idiopathic peripheral neuropathy    Past Surgical History:  Procedure Laterality Date  . APPENDECTOMY  1985  . BREAST BIOPSY Right 2009   positive  . BREAST BIOPSY Right 2009   negative  . BREAST LUMPECTOMY Right 2009   breast cancer  . CHOLECYSTECTOMY  1995  . DILATION AND CURETTAGE OF UTERUS  1990  . LAPAROSCOPIC PARTIAL COLECTOMY     stage 3-C carcinoma of the cecum, s/p chemotherapy and xrt  . LITHOTRIPSY    . THYROID LOBECTOMY  2002   s/p XRT   Family History  Problem Relation Age of Onset  . Stroke Mother        72s  . Lung cancer Father   . Prostate cancer Father   . Cancer Father        Colon  . Breast cancer Sister        67's  . Lung cancer Sister     . Breast cancer Maternal Aunt    Social History   Socioeconomic History  . Marital status: Divorced    Spouse name: Not on file  . Number of children: 0  . Years of education: Not on file  . Highest education level: Not on file  Occupational History  . Not on file  Social Needs  . Financial resource strain: Not on file  . Food insecurity:    Worry: Not on file    Inability: Not on file  . Transportation needs:    Medical: Not on file    Non-medical: Not on file  Tobacco Use  . Smoking status: Never Smoker  . Smokeless tobacco: Never Used  Substance and Sexual Activity  . Alcohol use: No    Alcohol/week: 0.0 oz  . Drug use: No  . Sexual activity: Not Currently  Lifestyle  . Physical activity:    Days per week: Not on file    Minutes per session: Not on file  . Stress: Not on file  Relationships  . Social connections:    Talks on phone: Not on file  Gets together: Not on file    Attends religious service: Not on file    Active member of club or organization: Not on file    Attends meetings of clubs or organizations: Not on file    Relationship status: Not on file  Other Topics Concern  . Not on file  Social History Narrative  . Not on file    Outpatient Encounter Medications as of 09/14/2017  Medication Sig  . clonazePAM (KLONOPIN) 0.5 MG tablet Take 0.5 mg by mouth 2 (two) times daily.  Marland Kitchen DYMISTA 137-50 MCG/ACT SUSP USE 2 SPRAYS IN EACH NOSTRIL TWICE DAILY  . ergocalciferol (VITAMIN D2) 50000 units capsule Take 1 capsule (50,000 Units total) by mouth once a week.  . escitalopram (LEXAPRO) 20 MG tablet Take 20 mg by mouth daily.    Marland Kitchen KLOR-CON 10 10 MEQ tablet TAKE 1 TABLET BY MOUTH EVERY DAY  . levothyroxine (SYNTHROID, LEVOTHROID) 88 MCG tablet Take 1 tablet (88 mcg total) by mouth daily before breakfast. TAKE 1 TABLET (88 MCG TOTAL) BY MOUTH DAILY BEFORE BREAKFAST. APPT NEEDED FOR FURTHER REFILLS  . levothyroxine (SYNTHROID, LEVOTHROID) 88 MCG tablet Take 1  tablet (88 mcg total) by mouth daily before breakfast. TAKE 1 TABLET (88 MCG TOTAL) BY MOUTH DAILY BEFORE BREAKFAST. APPT NEEDED FOR FURTHER REFILLS  . loperamide (IMODIUM) 1 MG/5ML solution Take 1 mg by mouth as needed for diarrhea or loose stools.  . magnesium oxide (MAG-OX) 400 MG tablet TAKE 1 TABLET (400 MG TOTAL) BY MOUTH DAILY.  . pantoprazole (PROTONIX) 40 MG tablet TAKE 1 TABLET BY MOUTH EVERY DAY  . potassium chloride (K-DUR) 10 MEQ tablet Take 1 tablet (10 mEq total) by mouth daily.  . [DISCONTINUED] clonazePAM (KLONOPIN) 1 MG tablet Take 1 mg by mouth 2 (two) times daily.   . [EXPIRED] cyanocobalamin ((VITAMIN B-12)) injection 1,000 mcg    No facility-administered encounter medications on file as of 09/14/2017.     Review of Systems  Constitutional: Negative for appetite change and unexpected weight change.  HENT: Negative for congestion and sinus pressure.   Respiratory: Negative for cough, chest tightness and shortness of breath.   Cardiovascular: Negative for chest pain, palpitations and leg swelling.  Gastrointestinal: Negative for abdominal pain, constipation, nausea and vomiting.       Persistent intermittent flares - loose stool/diarrhea.   Genitourinary: Negative for difficulty urinating and dysuria.  Musculoskeletal: Negative for joint swelling and myalgias.  Skin: Negative for color change and rash.  Neurological: Negative for dizziness, light-headedness and headaches.  Psychiatric/Behavioral: Negative for agitation and dysphoric mood.       Objective:     Blood pressure rechecked by me:  120/78  Physical Exam  Constitutional: She appears well-developed and well-nourished. No distress.  HENT:  Nose: Nose normal.  Mouth/Throat: Oropharynx is clear and moist.  Neck: Neck supple. No thyromegaly present.  Cardiovascular: Normal rate and regular rhythm.  Pulmonary/Chest: Breath sounds normal. No respiratory distress. She has no wheezes.  Abdominal: Soft. Bowel  sounds are normal. There is no tenderness.  Musculoskeletal: She exhibits no edema or tenderness.  Lymphadenopathy:    She has no cervical adenopathy.  Skin: No rash noted. No erythema.  Psychiatric: She has a normal mood and affect. Her behavior is normal.    BP 120/74 (BP Location: Left Arm, Patient Position: Sitting, Cuff Size: Normal)   Pulse 82   Temp 98.3 F (36.8 C) (Oral)   Resp 16   Wt 150 lb 12.8 oz (68.4  kg)   SpO2 96%   BMI 25.88 kg/m  Wt Readings from Last 3 Encounters:  09/14/17 150 lb 12.8 oz (68.4 kg)  07/15/17 148 lb 12.8 oz (67.5 kg)  01/21/17 147 lb 4.8 oz (66.8 kg)     Lab Results  Component Value Date   WBC 3.3 (L) 07/15/2017   HGB 14.4 07/15/2017   HCT 42.0 07/15/2017   PLT 181.0 07/15/2017   GLUCOSE 80 07/15/2017   CHOL 198 12/10/2016   TRIG 180.0 (H) 12/10/2016   HDL 46.30 12/10/2016   LDLDIRECT 126.0 07/11/2015   LDLCALC 116 (H) 12/10/2016   ALT 35 07/15/2017   AST 13 07/15/2017   NA 141 07/15/2017   K 3.8 07/15/2017   CL 105 07/15/2017   CREATININE 1.03 07/15/2017   BUN 18 07/15/2017   CO2 31 07/15/2017   TSH 1.39 07/07/2016   HGBA1C 5.3 12/10/2016       Assessment & Plan:   Problem List Items Addressed This Visit    Abnormal liver function test    Saw GI recently.  Had w/up and they are following.  CT - no liver mass.  Follow liver function tests.        Relevant Orders   Hepatic function panel   Anxiety    Followed by psychiatry.  Stable.       CARCINOMA, THYROID GLAND, PAPILLARY    On thyroid replacement.  Follow tsh.       GERD (gastroesophageal reflux disease)    Controlled on protonix.       History of breast cancer    Mammogram 10/08/16 - Birads I.  Scheduled for f/u mammogram 10/12/17.        History of malignant neoplasm of large intestine    Saw GI.  Planning for f/u colonoscopy.        Hypercholesterolemia    Low cholesterol diet and exercise.  Follow lipid panel.       Relevant Orders   Lipid panel     Basic metabolic panel   Mild depression ([place])    Followed by psychiatry.  Stable.        Relevant Orders   CBC with Differential/Platelet   TSH   Vitamin D deficiency    Follow vitamin D level.       Relevant Orders   VITAMIN D 25 Hydroxy (Vit-D Deficiency, Fractures)    Other Visit Diagnoses    B12 deficiency    -  Primary   Relevant Medications   cyanocobalamin ((VITAMIN B-12)) injection 1,000 mcg (Completed)       [name], MD

## 2017-09-16 ENCOUNTER — Other Ambulatory Visit: Payer: Self-pay

## 2017-09-22 ENCOUNTER — Encounter: Payer: Self-pay | Admitting: Internal Medicine

## 2017-09-22 NOTE — Assessment & Plan Note (Signed)
Followed by psychiatry.  Stable.  

## 2017-09-22 NOTE — Assessment & Plan Note (Signed)
Low cholesterol diet and exercise.  Follow lipid panel.   

## 2017-09-22 NOTE — Assessment & Plan Note (Signed)
Controlled on protonix.   

## 2017-09-22 NOTE — Assessment & Plan Note (Signed)
Mammogram 10/08/16 - Birads I.  Scheduled for f/u mammogram 10/12/17.

## 2017-09-22 NOTE — Assessment & Plan Note (Signed)
On thyroid replacement.  Follow tsh.  

## 2017-09-22 NOTE — Assessment & Plan Note (Signed)
Follow vitamin D level.  

## 2017-09-22 NOTE — Assessment & Plan Note (Signed)
Saw GI recently.  Had w/up and they are following.  CT - no liver mass.  Follow liver function tests.

## 2017-09-22 NOTE — Assessment & Plan Note (Signed)
Saw GI.  Planning for f/u colonoscopy.   

## 2017-09-23 ENCOUNTER — Other Ambulatory Visit (INDEPENDENT_AMBULATORY_CARE_PROVIDER_SITE_OTHER): Payer: PPO

## 2017-09-23 DIAGNOSIS — R945 Abnormal results of liver function studies: Secondary | ICD-10-CM | POA: Diagnosis not present

## 2017-09-23 DIAGNOSIS — E559 Vitamin D deficiency, unspecified: Secondary | ICD-10-CM

## 2017-09-23 DIAGNOSIS — F32A Depression, unspecified: Secondary | ICD-10-CM

## 2017-09-23 DIAGNOSIS — R7989 Other specified abnormal findings of blood chemistry: Secondary | ICD-10-CM

## 2017-09-23 DIAGNOSIS — E7800 Pure hypercholesterolemia, unspecified: Secondary | ICD-10-CM | POA: Diagnosis not present

## 2017-09-23 DIAGNOSIS — F320 Major depressive disorder, single episode, mild: Secondary | ICD-10-CM

## 2017-09-23 LAB — CBC WITH DIFFERENTIAL/PLATELET
BASOS PCT: 0.9 % (ref 0.0–3.0)
Basophils Absolute: 0 10*3/uL (ref 0.0–0.1)
EOS ABS: 0.1 10*3/uL (ref 0.0–0.7)
Eosinophils Relative: 2 % (ref 0.0–5.0)
HCT: 44.2 % (ref 36.0–46.0)
Hemoglobin: 15.1 g/dL — ABNORMAL HIGH (ref 12.0–15.0)
Lymphocytes Relative: 29.7 % (ref 12.0–46.0)
Lymphs Abs: 1 10*3/uL (ref 0.7–4.0)
MCHC: 34.2 g/dL (ref 30.0–36.0)
MCV: 87 fl (ref 78.0–100.0)
Monocytes Absolute: 0.3 10*3/uL (ref 0.1–1.0)
Monocytes Relative: 7.7 % (ref 3.0–12.0)
NEUTROS ABS: 2.1 10*3/uL (ref 1.4–7.7)
Neutrophils Relative %: 59.7 % (ref 43.0–77.0)
PLATELETS: 193 10*3/uL (ref 150.0–400.0)
RBC: 5.08 Mil/uL (ref 3.87–5.11)
RDW: 14.4 % (ref 11.5–15.5)
WBC: 3.5 10*3/uL — ABNORMAL LOW (ref 4.0–10.5)

## 2017-09-23 LAB — LIPID PANEL
CHOL/HDL RATIO: 4
CHOLESTEROL: 211 mg/dL — AB (ref 0–200)
HDL: 47.8 mg/dL (ref >39.00)
LDL CALC: 129 mg/dL — AB (ref 0–99)
NonHDL: 162.78
TRIGLYCERIDES: 167 mg/dL — AB (ref 0.0–149.0)
VLDL: 33.4 mg/dL (ref 0.0–40.0)

## 2017-09-23 LAB — BASIC METABOLIC PANEL
BUN: 15 mg/dL (ref 6–23)
CHLORIDE: 104 meq/L (ref 96–112)
CO2: 28 meq/L (ref 19–32)
CREATININE: 1.09 mg/dL (ref 0.40–1.20)
Calcium: 9.3 mg/dL (ref 8.4–10.5)
GFR: 53.08 mL/min — ABNORMAL LOW (ref >60.00)
GLUCOSE: 93 mg/dL (ref 70–99)
Potassium: 4.1 mEq/L (ref 3.5–5.1)
Sodium: 140 mEq/L (ref 135–145)

## 2017-09-23 LAB — HEPATIC FUNCTION PANEL
ALT: 62 U/L — ABNORMAL HIGH (ref 0–35)
AST: 25 U/L (ref 0–37)
Albumin: 4.1 g/dL (ref 3.5–5.2)
Alkaline Phosphatase: 94 U/L (ref 39–117)
BILIRUBIN DIRECT: 0.1 mg/dL (ref 0.0–0.3)
BILIRUBIN TOTAL: 0.5 mg/dL (ref 0.2–1.2)
TOTAL PROTEIN: 7 g/dL (ref 6.0–8.3)

## 2017-09-23 LAB — VITAMIN D 25 HYDROXY (VIT D DEFICIENCY, FRACTURES): VITD: 25.76 ng/mL — ABNORMAL LOW (ref 30.00–100.00)

## 2017-09-23 LAB — TSH: TSH: 4.81 u[IU]/mL — ABNORMAL HIGH (ref 0.35–4.50)

## 2017-09-24 ENCOUNTER — Other Ambulatory Visit: Payer: Self-pay | Admitting: Internal Medicine

## 2017-09-24 DIAGNOSIS — R945 Abnormal results of liver function studies: Secondary | ICD-10-CM

## 2017-09-24 DIAGNOSIS — R7989 Other specified abnormal findings of blood chemistry: Secondary | ICD-10-CM

## 2017-09-24 NOTE — Progress Notes (Signed)
Order placed for f/u liver check.

## 2017-09-29 ENCOUNTER — Other Ambulatory Visit: Payer: Self-pay

## 2017-09-29 MED ORDER — LEVOTHYROXINE SODIUM 100 MCG PO TABS: 100.0000 ug | ORAL_TABLET | Freq: Every day | ORAL | 3 refills | Status: DC

## 2017-10-01 ENCOUNTER — Other Ambulatory Visit: Payer: Self-pay | Admitting: Internal Medicine

## 2017-10-01 DIAGNOSIS — R790 Abnormal level of blood mineral: Secondary | ICD-10-CM

## 2017-10-12 ENCOUNTER — Ambulatory Visit
Admission: RE | Admit: 2017-10-12 | Discharge: 2017-10-12 | Disposition: A | Discharge status: Home / Self Care | Payer: PPO | Source: Ambulatory Visit | Attending: Nurse Practitioner | Admitting: Nurse Practitioner

## 2017-10-12 DIAGNOSIS — Z853 Personal history of malignant neoplasm of breast: Secondary | ICD-10-CM | POA: Diagnosis not present

## 2017-10-12 DIAGNOSIS — C50911 Malignant neoplasm of unspecified site of right female breast: Secondary | ICD-10-CM

## 2017-10-12 DIAGNOSIS — Z1231 Encounter for screening mammogram for malignant neoplasm of breast: Secondary | ICD-10-CM | POA: Diagnosis not present

## 2017-10-14 ENCOUNTER — Other Ambulatory Visit (INDEPENDENT_AMBULATORY_CARE_PROVIDER_SITE_OTHER): Payer: PPO

## 2017-10-14 DIAGNOSIS — R945 Abnormal results of liver function studies: Secondary | ICD-10-CM

## 2017-10-14 DIAGNOSIS — R7989 Other specified abnormal findings of blood chemistry: Secondary | ICD-10-CM

## 2017-10-15 LAB — HEPATIC FUNCTION PANEL
ALBUMIN: 4.2 g/dL (ref 3.5–5.2)
ALK PHOS: 94 U/L (ref 39–117)
ALT: 75 U/L — AB (ref 0–35)
AST: 24 U/L (ref 0–37)
Bilirubin, Direct: 0.1 mg/dL (ref 0.0–0.3)
TOTAL PROTEIN: 7.1 g/dL (ref 6.0–8.3)
Total Bilirubin: 0.6 mg/dL (ref 0.2–1.2)

## 2017-10-18 ENCOUNTER — Other Ambulatory Visit: Payer: Self-pay | Admitting: Internal Medicine

## 2017-10-18 DIAGNOSIS — C73 Malignant neoplasm of thyroid gland: Secondary | ICD-10-CM

## 2017-10-18 DIAGNOSIS — R945 Abnormal results of liver function studies: Secondary | ICD-10-CM

## 2017-10-18 DIAGNOSIS — R7989 Other specified abnormal findings of blood chemistry: Secondary | ICD-10-CM

## 2017-10-18 NOTE — Progress Notes (Signed)
Order placed for f/u tsh and liver panel

## 2017-10-21 ENCOUNTER — Ambulatory Visit (INDEPENDENT_AMBULATORY_CARE_PROVIDER_SITE_OTHER): Payer: PPO

## 2017-10-21 DIAGNOSIS — E538 Deficiency of other specified B group vitamins: Secondary | ICD-10-CM

## 2017-10-21 MED ORDER — CYANOCOBALAMIN 1000 MCG/ML IJ SOLN
1000.0000 ug | Freq: Once | INTRAMUSCULAR | Status: AC
  Administered 2017-10-21: 1000 ug via INTRAMUSCULAR

## 2017-10-21 NOTE — Progress Notes (Signed)
Patient comes in today for a vitamin B 12 injection. Administered in Right deltoid IM. Patient tolerated well. 

## 2017-10-25 DIAGNOSIS — K529 Noninfective gastroenteritis and colitis, unspecified: Secondary | ICD-10-CM | POA: Diagnosis not present

## 2017-10-25 DIAGNOSIS — K30 Functional dyspepsia: Secondary | ICD-10-CM | POA: Diagnosis not present

## 2017-10-25 DIAGNOSIS — R110 Nausea: Secondary | ICD-10-CM | POA: Diagnosis not present

## 2017-10-25 DIAGNOSIS — K760 Fatty (change of) liver, not elsewhere classified: Secondary | ICD-10-CM | POA: Diagnosis not present

## 2017-10-28 ENCOUNTER — Encounter: Payer: Self-pay | Admitting: *Deleted

## 2017-10-29 ENCOUNTER — Ambulatory Visit
Admission: RE | Admit: 2017-10-29 | Discharge: 2017-10-29 | Disposition: A | Discharge status: Home / Self Care | Payer: PPO | Source: Ambulatory Visit | Attending: Unknown Physician Specialty | Admitting: Unknown Physician Specialty

## 2017-10-29 ENCOUNTER — Ambulatory Visit: Payer: PPO | Admitting: Anesthesiology

## 2017-10-29 ENCOUNTER — Encounter
Admission: RE | Disposition: A | Discharge status: Home / Self Care | Payer: Self-pay | Source: Ambulatory Visit | Attending: Unknown Physician Specialty

## 2017-10-29 ENCOUNTER — Encounter: Payer: Self-pay | Admitting: *Deleted

## 2017-10-29 DIAGNOSIS — Z881 Allergy status to other antibiotic agents status: Secondary | ICD-10-CM | POA: Diagnosis not present

## 2017-10-29 DIAGNOSIS — Z1211 Encounter for screening for malignant neoplasm of colon: Secondary | ICD-10-CM | POA: Diagnosis not present

## 2017-10-29 DIAGNOSIS — K449 Diaphragmatic hernia without obstruction or gangrene: Secondary | ICD-10-CM | POA: Diagnosis not present

## 2017-10-29 DIAGNOSIS — Z923 Personal history of irradiation: Secondary | ICD-10-CM | POA: Diagnosis not present

## 2017-10-29 DIAGNOSIS — D126 Benign neoplasm of colon, unspecified: Secondary | ICD-10-CM | POA: Diagnosis not present

## 2017-10-29 DIAGNOSIS — Z79899 Other long term (current) drug therapy: Secondary | ICD-10-CM | POA: Diagnosis not present

## 2017-10-29 DIAGNOSIS — Z980 Intestinal bypass and anastomosis status: Secondary | ICD-10-CM | POA: Diagnosis not present

## 2017-10-29 DIAGNOSIS — Z85850 Personal history of malignant neoplasm of thyroid: Secondary | ICD-10-CM | POA: Insufficient documentation

## 2017-10-29 DIAGNOSIS — Z9221 Personal history of antineoplastic chemotherapy: Secondary | ICD-10-CM | POA: Diagnosis not present

## 2017-10-29 DIAGNOSIS — M810 Age-related osteoporosis without current pathological fracture: Secondary | ICD-10-CM | POA: Diagnosis not present

## 2017-10-29 DIAGNOSIS — R197 Diarrhea, unspecified: Secondary | ICD-10-CM | POA: Diagnosis not present

## 2017-10-29 DIAGNOSIS — E7800 Pure hypercholesterolemia, unspecified: Secondary | ICD-10-CM | POA: Insufficient documentation

## 2017-10-29 DIAGNOSIS — K30 Functional dyspepsia: Secondary | ICD-10-CM | POA: Insufficient documentation

## 2017-10-29 DIAGNOSIS — D123 Benign neoplasm of transverse colon: Secondary | ICD-10-CM | POA: Diagnosis not present

## 2017-10-29 DIAGNOSIS — K648 Other hemorrhoids: Secondary | ICD-10-CM | POA: Diagnosis not present

## 2017-10-29 DIAGNOSIS — Q438 Other specified congenital malformations of intestine: Secondary | ICD-10-CM | POA: Insufficient documentation

## 2017-10-29 DIAGNOSIS — E039 Hypothyroidism, unspecified: Secondary | ICD-10-CM | POA: Diagnosis not present

## 2017-10-29 DIAGNOSIS — K219 Gastro-esophageal reflux disease without esophagitis: Secondary | ICD-10-CM | POA: Diagnosis not present

## 2017-10-29 DIAGNOSIS — M1990 Unspecified osteoarthritis, unspecified site: Secondary | ICD-10-CM | POA: Diagnosis not present

## 2017-10-29 DIAGNOSIS — F419 Anxiety disorder, unspecified: Secondary | ICD-10-CM | POA: Insufficient documentation

## 2017-10-29 DIAGNOSIS — K635 Polyp of colon: Secondary | ICD-10-CM | POA: Insufficient documentation

## 2017-10-29 DIAGNOSIS — D131 Benign neoplasm of stomach: Secondary | ICD-10-CM | POA: Diagnosis not present

## 2017-10-29 DIAGNOSIS — K3189 Other diseases of stomach and duodenum: Secondary | ICD-10-CM | POA: Diagnosis not present

## 2017-10-29 DIAGNOSIS — E785 Hyperlipidemia, unspecified: Secondary | ICD-10-CM | POA: Insufficient documentation

## 2017-10-29 DIAGNOSIS — Z85038 Personal history of other malignant neoplasm of large intestine: Secondary | ICD-10-CM | POA: Insufficient documentation

## 2017-10-29 DIAGNOSIS — K317 Polyp of stomach and duodenum: Secondary | ICD-10-CM | POA: Diagnosis not present

## 2017-10-29 DIAGNOSIS — Z853 Personal history of malignant neoplasm of breast: Secondary | ICD-10-CM | POA: Diagnosis not present

## 2017-10-29 DIAGNOSIS — K2960 Other gastritis without bleeding: Secondary | ICD-10-CM | POA: Diagnosis not present

## 2017-10-29 DIAGNOSIS — D124 Benign neoplasm of descending colon: Secondary | ICD-10-CM | POA: Insufficient documentation

## 2017-10-29 DIAGNOSIS — Z800 Family history of malignant neoplasm of digestive organs: Secondary | ICD-10-CM | POA: Insufficient documentation

## 2017-10-29 DIAGNOSIS — Z888 Allergy status to other drugs, medicaments and biological substances status: Secondary | ICD-10-CM | POA: Insufficient documentation

## 2017-10-29 DIAGNOSIS — G608 Other hereditary and idiopathic neuropathies: Secondary | ICD-10-CM | POA: Insufficient documentation

## 2017-10-29 DIAGNOSIS — Z885 Allergy status to narcotic agent status: Secondary | ICD-10-CM | POA: Insufficient documentation

## 2017-10-29 DIAGNOSIS — Z86010 Personal history of colonic polyps: Secondary | ICD-10-CM | POA: Diagnosis not present

## 2017-10-29 HISTORY — DX: Hypothyroidism, unspecified: E03.9

## 2017-10-29 HISTORY — PX: ESOPHAGOGASTRODUODENOSCOPY (EGD) WITH PROPOFOL: SHX5813

## 2017-10-29 HISTORY — DX: Liver disease, unspecified: K76.9

## 2017-10-29 HISTORY — PX: COLONOSCOPY WITH PROPOFOL: SHX5780

## 2017-10-29 HISTORY — DX: Pure hypercholesterolemia, unspecified: E78.00

## 2017-10-29 HISTORY — DX: Unspecified osteoarthritis, unspecified site: M19.90

## 2017-10-29 SURGERY — COLONOSCOPY WITH PROPOFOL
Anesthesia: General

## 2017-10-29 MED ORDER — PROPOFOL 10 MG/ML IV BOLUS
INTRAVENOUS | Status: DC | PRN
  Administered 2017-10-29: 50 mg via INTRAVENOUS
  Administered 2017-10-29 (×2): 30 mg via INTRAVENOUS

## 2017-10-29 MED ORDER — LIDOCAINE HCL (PF) 2 % IJ SOLN
INTRAMUSCULAR | Status: AC
  Filled 2017-10-29: qty 10

## 2017-10-29 MED ORDER — LIDOCAINE HCL (CARDIAC) PF 100 MG/5ML IV SOSY
PREFILLED_SYRINGE | INTRAVENOUS | Status: DC | PRN
  Administered 2017-10-29: 35 mg via INTRAVENOUS

## 2017-10-29 MED ORDER — SODIUM CHLORIDE 0.9 % IV SOLN: INTRAVENOUS | Status: DC

## 2017-10-29 MED ORDER — SODIUM CHLORIDE 0.9 % IV SOLN
INTRAVENOUS | Status: DC
  Administered 2017-10-29: 10:00:00 via INTRAVENOUS

## 2017-10-29 MED ORDER — PROPOFOL 500 MG/50ML IV EMUL
INTRAVENOUS | Status: DC | PRN
  Administered 2017-10-29: 125 ug/kg/min via INTRAVENOUS

## 2017-10-29 MED ORDER — PROPOFOL 500 MG/50ML IV EMUL
INTRAVENOUS | Status: AC
  Filled 2017-10-29: qty 50

## 2017-10-29 NOTE — Transfer of Care (Signed)
Immediate Anesthesia Transfer of Care Note  Patient: Debra Hale  Procedure(s) Performed: COLONOSCOPY WITH PROPOFOL (N/A ) ESOPHAGOGASTRODUODENOSCOPY (EGD) WITH PROPOFOL (N/A )  Patient Location: PACU and Endoscopy Unit  Anesthesia Type:General  Level of Consciousness: awake  Airway & Oxygen Therapy: Patient Spontanous Breathing  Post-op Assessment: Report given to RN  Post vital signs: stable  Last Vitals:  Vitals Value Taken Time  BP 102/59 10/29/2017 11:27 AM  Temp 36.2 C 10/29/2017 11:20 AM  Pulse 70 10/29/2017 11:29 AM  Resp 19 10/29/2017 11:29 AM  SpO2 92 % 10/29/2017 11:29 AM  Vitals shown include unvalidated device data.  Last Pain:  Vitals:   10/29/17 1120  TempSrc: Tympanic  PainSc:          Complications: No apparent anesthesia complications

## 2017-10-29 NOTE — Anesthesia Postprocedure Evaluation (Signed)
Anesthesia Post Note  Patient: Debra Hale  Procedure(s) Performed: COLONOSCOPY WITH PROPOFOL (N/A ) ESOPHAGOGASTRODUODENOSCOPY (EGD) WITH PROPOFOL (N/A )  Patient location during evaluation: Endoscopy Anesthesia Type: General Level of consciousness: awake and alert and oriented Pain management: pain level controlled Vital Signs Assessment: post-procedure vital signs reviewed and stable Respiratory status: spontaneous breathing Cardiovascular status: blood pressure returned to baseline Anesthetic complications: no     Last Vitals:  Vitals:   10/29/17 1140 10/29/17 1142  BP: 119/70   Pulse: 71 72  Resp: 16 15  Temp:    SpO2: 96% 97%    Last Pain:  Vitals:   10/29/17 1142  TempSrc:   PainSc: 0-No pain                 ,

## 2017-10-29 NOTE — Op Note (Signed)
[facility] Gastroenterology Patient Name: Debra Hale Procedure Date: 10/29/2017 10:20 AM MRN: [id number] Account #: [id number] Date of Birth: 11-Jul-1950 Admit Type: Outpatient Age: 67 Room: [facility] ENDO ROOM 3 Gender: Female Note Status: Finalized Procedure:            Colonoscopy Indications:          High risk colon cancer surveillance: Personal history                        of colon cancer Providers:            Manya Silvas, MD Referring MD:         [name], MD (Referring MD) Medicines:            Propofol per Anesthesia Complications:        No immediate complications. Procedure:            Pre-Anesthesia Assessment:                       - After reviewing the risks and benefits, the patient                        was deemed in satisfactory condition to undergo the                        procedure.                       After obtaining informed consent, the colonoscope was                        passed under direct vision. Throughout the procedure,                        the patient's blood pressure, pulse, and oxygen                        saturations were monitored continuously. The                        Colonoscope was introduced through the anus and                        advanced to the the ileocolonic anastomosis. The                        colonoscopy was somewhat difficult due to a tortuous                        colon. Successful completion of the procedure was aided                        by changing endoscopes. The patient tolerated the                        procedure well. The quality of the bowel preparation                        was good. Findings:      A medium polyp was found in the transverse colon. The polyp was sessile.  The polyp was removed with a hot snare. Resection and retrieval were       complete. It was removed piecemeal to allow capture. Small pieces were       removed. One hot snare in descending colon and  one cold snare also. cold       biopsy of polyps done in descending colon and      transverse colon Impression:           - One medium polyp in the transverse colon, removed                        with a hot snare. Resected and retrieved. Recommendation:       - Await pathology results. Manya Silvas, MD 10/29/2017 11:31:35 AM This report has been signed electronically. Number of Addenda: 0 Note Initiated On: 10/29/2017 10:20 AM Scope Withdrawal Time: 0 hours 21 minutes 53 seconds  Total Procedure Duration: 0 hours 44 minutes 22 seconds       [facility]

## 2017-10-29 NOTE — Anesthesia Post-op Follow-up Note (Signed)
Anesthesia QCDR form completed.        

## 2017-10-29 NOTE — Anesthesia Preprocedure Evaluation (Signed)
Anesthesia Evaluation  Patient identified by MRN, date of birth, ID band Patient awake    Reviewed: Allergy & Precautions, NPO status , Patient's Chart, lab work & pertinent test results  Airway Mallampati: II  TM Distance: >3 FB     Dental   Pulmonary neg pulmonary ROS,    Pulmonary exam normal        Cardiovascular negative cardio ROS Normal cardiovascular exam+ dysrhythmias      Neuro/Psych PSYCHIATRIC DISORDERS Anxiety Depression  Neuromuscular disease    GI/Hepatic GERD  ,  Endo/Other  Hypothyroidism   Renal/GU   negative genitourinary   Musculoskeletal  (+) Arthritis ,   Abdominal Normal abdominal exam  (+)   Peds negative pediatric ROS (+)  Hematology negative hematology ROS (+)   Anesthesia Other Findings Past Medical History: No date: Anxiety No date: Arthritis     Comment:  Osteoarthritis 2009: Breast cancer ([place])     Comment:  right breast lumpectomy with rad tx No date: Colon cancer ([place])     Comment:  surgery with chemo and rad tx No date: GERD (gastroesophageal reflux disease) No date: Hypothyroidism No date: Liver disease No date: Liver nodule     Comment:  s/p negative biopsy 2002: Malignant neoplasm of thyroid gland ([facility])     Comment:  s/p surgery and XRT No date: Nephrolithiasis     Comment:  s/p lithotripsy No date: Osteoporosis No date: Other and unspecified hyperlipidemia No date: Palpitations No date: Personal history of chemotherapy No date: Personal history of malignant neoplasm of large intestine     Comment:  carcinoma - cecum, s/p right laparoscopic colectomy -               s/p chemotherapy and XRT No date: Personal history of radiation therapy No date: Pure hypercholesterolemia No date: Unspecified hereditary and idiopathic peripheral neuropathy  Reproductive/Obstetrics                             Anesthesia Physical Anesthesia Plan  ASA:  II  Anesthesia Plan: General   Post-op Pain Management:    Induction: Intravenous  PONV Risk Score and Plan: TIVA  Airway Management Planned: Nasal Cannula  Additional Equipment:   Intra-op Plan:   Post-operative Plan:   Informed Consent: I have reviewed the patients History and Physical, chart, labs and discussed the procedure including the risks, benefits and alternatives for the proposed anesthesia with the patient or authorized representative who has indicated his/her understanding and acceptance.   Dental advisory given  Plan Discussed with: CRNA and Surgeon  Anesthesia Plan Comments:         Anesthesia Quick Evaluation

## 2017-10-29 NOTE — Op Note (Signed)
[facility] Gastroenterology Patient Name: Debra Hale Procedure Date: 10/29/2017 10:20 AM MRN: [id number] Account #: [id number] Date of Birth: Sep 28, 1950 Admit Type: Outpatient Age: 67 Room: [facility] ENDO ROOM 3 Gender: Female Note Status: Finalized Procedure:            Upper GI endoscopy Indications:          Dyspepsia, Indigestion Providers:            Manya Silvas, MD Referring MD:         [name], MD (Referring MD) Medicines:            Propofol per Anesthesia Complications:        No immediate complications. Procedure:            Pre-Anesthesia Assessment:                       - After reviewing the risks and benefits, the patient                        was deemed in satisfactory condition to undergo the                        procedure.                       After obtaining informed consent, the endoscope was                        passed under direct vision. Throughout the procedure,                        the patient's blood pressure, pulse, and oxygen                        saturations were monitored continuously. The Endoscope                        was introduced through the mouth, and advanced to the                        second part of duodenum. The upper GI endoscopy was                        accomplished without difficulty. The patient tolerated                        the procedure well. Findings:      The examined esophagus was normal. small hiatal hernia seen.      Multiple large pedunculated and sessile polyps with no bleeding and no       stigmata of recent bleeding were found in the gastric body. Biopsies       done of antrum and body.      The examined duodenum was normal. Impression:           - Normal esophagus.                       - Multiple gastric polyps.                       - Normal examined duodenum.                       -  No specimens collected. Recommendation:       - Await pathology results. Manya Silvas,  MD 10/29/2017 10:38:41 AM This report has been signed electronically. Number of Addenda: 0 Note Initiated On: 10/29/2017 10:20 AM      [facility]

## 2017-10-29 NOTE — H&P (Signed)
Primary Care Physician:  [name], MD Primary Gastroenterologist:  Dr. Vira Agar  Pre-Procedure History & Physical: HPI:  Debra Hale is a 67 y.o. female is here for an endoscopy and colonoscopy.  Done for Ph colon cancer and nausea and indigestion.   Past Medical History:  Diagnosis Date  . Anxiety   . Arthritis    Osteoarthritis  . Breast cancer ([place]) 2009   right breast lumpectomy with rad tx  . Colon cancer ([place])    surgery with chemo and rad tx  . GERD (gastroesophageal reflux disease)   . Hypothyroidism   . Liver disease   . Liver nodule    s/p negative biopsy  . Malignant neoplasm of thyroid gland ([place]) 2002   s/p surgery and XRT  . Nephrolithiasis    s/p lithotripsy  . Osteoporosis   . Other and unspecified hyperlipidemia   . Palpitations   . Personal history of chemotherapy   . Personal history of malignant neoplasm of large intestine    carcinoma - cecum, s/p right laparoscopic colectomy - s/p chemotherapy and XRT  . Personal history of radiation therapy   . Pure hypercholesterolemia   . Unspecified hereditary and idiopathic peripheral neuropathy     Past Surgical History:  Procedure Laterality Date  . APPENDECTOMY  1985  . BREAST BIOPSY Right 2009   positive  . BREAST BIOPSY Right 2009   negative  . BREAST LUMPECTOMY Right 2009   breast cancer  . CHOLECYSTECTOMY  1995  . COLONOSCOPY    . DILATION AND CURETTAGE OF UTERUS  1990  . DILATION AND CURETTAGE, DIAGNOSTIC / THERAPEUTIC  1990  . ESOPHAGOGASTRODUODENOSCOPY    . LAPAROSCOPIC PARTIAL COLECTOMY     stage 3-C carcinoma of the cecum, s/p chemotherapy and xrt  . LITHOTRIPSY    . SIGMOIDOSCOPY  08/26/1993  . THYROID LOBECTOMY  2002   s/p XRT    Prior to Admission medications   Medication Sig Start Date End Date Taking? Authorizing Provider  clonazePAM (KLONOPIN) 0.5 MG tablet Take 0.5 mg by mouth 2 (two) times daily. 09/06/17  Yes [provider]  DYMISTA 137-50 MCG/ACT SUSP USE  2 SPRAYS IN EACH NOSTRIL TWICE DAILY 10/12/16  Yes [name], MD  ergocalciferol (VITAMIN D2) 50000 units capsule Take 1 capsule (50,000 Units total) by mouth once a week. 07/08/16  Yes [name], MD  escitalopram (LEXAPRO) 20 MG tablet Take 20 mg by mouth daily.     Yes [provider]  KLOR-CON 10 10 MEQ tablet TAKE 1 TABLET BY MOUTH EVERY DAY 07/19/17  Yes [name], MD  levothyroxine (SYNTHROID, LEVOTHROID) 100 MCG tablet Take 1 tablet (100 mcg total) by mouth daily. 09/29/17  Yes [name], MD  loperamide (IMODIUM) 1 MG/5ML solution Take 1 mg by mouth as needed for diarrhea or loose stools.   Yes [provider]  magnesium oxide (MAG-OX) 400 MG tablet TAKE 1 TABLET BY MOUTH EVERY DAY 10/01/17  Yes [name], MD  pantoprazole (PROTONIX) 40 MG tablet TAKE 1 TABLET BY MOUTH EVERY DAY 08/02/17  Yes [name], MD  potassium chloride (K-DUR) 10 MEQ tablet Take 1 tablet (10 mEq total) by mouth daily. 03/20/16  Yes [name], MD    Allergies as of 08/31/2017 - Review Complete 07/21/2017  Allergen Reaction Noted  . Augmentin [amoxicillin-pot clavulanate] Diarrhea 01/28/2015  . Bentyl [dicyclomine hcl]  05/06/2012  . Ciprofloxacin Diarrhea 05/06/2012  . Codeine Other (See Comments) 05/06/2012  . Demeclocycline Other (  See Comments) 05/06/2012  . Epinephrine  05/06/2012  . Flagyl [metronidazole]  05/06/2012  . Librax [chlordiazepoxide-clidinium]  05/06/2012  . Phenobarbital  05/06/2012  . Tetracyclines & related Other (See Comments) 05/06/2012  . Ultram [tramadol] Other (See Comments) 05/06/2012    Family History  Problem Relation Age of Onset  . Stroke Mother        55s  . Alzheimer's disease Mother   . Cancer Mother   . Lung cancer Father   . Prostate cancer Father   . Cancer Father        Colon  . Colon cancer Father   . Breast cancer Sister        34's  . Lung cancer Sister   . Breast cancer Maternal Aunt     Social History    Socioeconomic History  . Marital status: Divorced    Spouse name: Not on file  . Number of children: 0  . Years of education: Not on file  . Highest education level: Not on file  Occupational History  . Not on file  Social Needs  . Financial resource strain: Not on file  . Food insecurity:    Worry: Not on file    Inability: Not on file  . Transportation needs:    Medical: Not on file    Non-medical: Not on file  Tobacco Use  . Smoking status: Never Smoker  . Smokeless tobacco: Never Used  Substance and Sexual Activity  . Alcohol use: No    Alcohol/week: 0.0 oz  . Drug use: No  . Sexual activity: Not Currently  Lifestyle  . Physical activity:    Days per week: Not on file    Minutes per session: Not on file  . Stress: Not on file  Relationships  . Social connections:    Talks on phone: Not on file    Gets together: Not on file    Attends religious service: Not on file    Active member of club or organization: Not on file    Attends meetings of clubs or organizations: Not on file    Relationship status: Not on file  . Intimate partner violence:    Fear of current or ex partner: Not on file    Emotionally abused: Not on file    Physically abused: Not on file    Forced sexual activity: Not on file  Other Topics Concern  . Not on file  Social History Narrative  . Not on file    Review of Systems: See HPI, otherwise negative ROS  Physical Exam: BP 118/84   Pulse (!) 102   Temp (!) 97.1 F (36.2 C) (Tympanic)   Resp 18   Ht 5\' 4"  (1.626 m)   Wt 68 kg (150 lb)   SpO2 97%   BMI 25.75 kg/m  General:   Alert,  pleasant and cooperative in NAD Head:  Normocephalic and atraumatic. Neck:  Supple; no masses or thyromegaly. Lungs:  Clear throughout to auscultation.    Heart:  Regular rate and rhythm. Abdomen:  Soft, nontender and nondistended. Normal bowel sounds, without guarding, and without rebound.   Neurologic:  Alert and  oriented x4;  grossly normal  neurologically.  Impression/Plan: [name] is here for an endoscopy and colonoscopy to be performed for [facility] colon cancer and nausea and indigestion.  Risks, benefits, limitations, and alternatives regarding  endoscopy and colonoscopy have been reviewed with the patient.  Questions have been answered.  All parties agreeable.  Gaylyn Cheers, MD  10/29/2017, 10:24 AM

## 2017-11-02 LAB — SURGICAL PATHOLOGY

## 2017-11-11 ENCOUNTER — Other Ambulatory Visit (INDEPENDENT_AMBULATORY_CARE_PROVIDER_SITE_OTHER): Payer: PPO

## 2017-11-11 DIAGNOSIS — C73 Malignant neoplasm of thyroid gland: Secondary | ICD-10-CM

## 2017-11-11 DIAGNOSIS — R945 Abnormal results of liver function studies: Secondary | ICD-10-CM | POA: Diagnosis not present

## 2017-11-11 DIAGNOSIS — R7989 Other specified abnormal findings of blood chemistry: Secondary | ICD-10-CM

## 2017-11-12 LAB — TSH: TSH: 1.44 u[IU]/mL (ref 0.35–4.50)

## 2017-11-12 LAB — HEPATIC FUNCTION PANEL
ALT: 61 U/L — ABNORMAL HIGH (ref 0–35)
AST: 24 U/L (ref 0–37)
Albumin: 3.7 g/dL (ref 3.5–5.2)
Alkaline Phosphatase: 84 U/L (ref 39–117)
BILIRUBIN DIRECT: 0.1 mg/dL (ref 0.0–0.3)
BILIRUBIN TOTAL: 0.5 mg/dL (ref 0.2–1.2)
Total Protein: 6.3 g/dL (ref 6.0–8.3)

## 2017-11-17 ENCOUNTER — Encounter: Payer: Self-pay | Admitting: *Deleted

## 2017-11-25 ENCOUNTER — Ambulatory Visit (INDEPENDENT_AMBULATORY_CARE_PROVIDER_SITE_OTHER): Payer: PPO

## 2017-11-25 DIAGNOSIS — E538 Deficiency of other specified B group vitamins: Secondary | ICD-10-CM | POA: Diagnosis not present

## 2017-11-25 MED ORDER — CYANOCOBALAMIN 1000 MCG/ML IJ SOLN
1000.0000 ug | Freq: Once | INTRAMUSCULAR | Status: AC
  Administered 2017-11-25: 1000 ug via INTRAMUSCULAR

## 2017-11-25 NOTE — Progress Notes (Addendum)
Patient comes in today for a B12 injection. Administered in left deltoid IM. Patient tolerated well.  Reviewed.    Dr Nicki Reaper

## 2017-11-26 ENCOUNTER — Other Ambulatory Visit: Payer: Self-pay | Admitting: Internal Medicine

## 2017-12-02 DIAGNOSIS — Z86010 Personal history of colonic polyps: Secondary | ICD-10-CM | POA: Diagnosis not present

## 2017-12-02 DIAGNOSIS — Z85038 Personal history of other malignant neoplasm of large intestine: Secondary | ICD-10-CM | POA: Diagnosis not present

## 2017-12-02 DIAGNOSIS — R195 Other fecal abnormalities: Secondary | ICD-10-CM | POA: Diagnosis not present

## 2017-12-02 DIAGNOSIS — K760 Fatty (change of) liver, not elsewhere classified: Secondary | ICD-10-CM | POA: Diagnosis not present

## 2017-12-02 DIAGNOSIS — K219 Gastro-esophageal reflux disease without esophagitis: Secondary | ICD-10-CM | POA: Diagnosis not present

## 2017-12-02 DIAGNOSIS — R748 Abnormal levels of other serum enzymes: Secondary | ICD-10-CM | POA: Diagnosis not present

## 2018-01-06 ENCOUNTER — Ambulatory Visit (INDEPENDENT_AMBULATORY_CARE_PROVIDER_SITE_OTHER): Payer: PPO | Admitting: Internal Medicine

## 2018-01-06 VITALS — BP 118/70 | HR 86 | Temp 97.5°F | Resp 18 | Wt 149.0 lb

## 2018-01-06 DIAGNOSIS — K219 Gastro-esophageal reflux disease without esophagitis: Secondary | ICD-10-CM | POA: Diagnosis not present

## 2018-01-06 DIAGNOSIS — E538 Deficiency of other specified B group vitamins: Secondary | ICD-10-CM

## 2018-01-06 DIAGNOSIS — F32A Depression, unspecified: Secondary | ICD-10-CM

## 2018-01-06 DIAGNOSIS — Z9109 Other allergy status, other than to drugs and biological substances: Secondary | ICD-10-CM | POA: Diagnosis not present

## 2018-01-06 DIAGNOSIS — Z0000 Encounter for general adult medical examination without abnormal findings: Secondary | ICD-10-CM

## 2018-01-06 DIAGNOSIS — F419 Anxiety disorder, unspecified: Secondary | ICD-10-CM

## 2018-01-06 DIAGNOSIS — R748 Abnormal levels of other serum enzymes: Secondary | ICD-10-CM | POA: Diagnosis not present

## 2018-01-06 DIAGNOSIS — F439 Reaction to severe stress, unspecified: Secondary | ICD-10-CM

## 2018-01-06 DIAGNOSIS — R945 Abnormal results of liver function studies: Secondary | ICD-10-CM

## 2018-01-06 DIAGNOSIS — C73 Malignant neoplasm of thyroid gland: Secondary | ICD-10-CM | POA: Diagnosis not present

## 2018-01-06 DIAGNOSIS — E559 Vitamin D deficiency, unspecified: Secondary | ICD-10-CM | POA: Diagnosis not present

## 2018-01-06 DIAGNOSIS — K760 Fatty (change of) liver, not elsewhere classified: Secondary | ICD-10-CM | POA: Diagnosis not present

## 2018-01-06 DIAGNOSIS — E7800 Pure hypercholesterolemia, unspecified: Secondary | ICD-10-CM

## 2018-01-06 DIAGNOSIS — R7989 Other specified abnormal findings of blood chemistry: Secondary | ICD-10-CM

## 2018-01-06 DIAGNOSIS — R195 Other fecal abnormalities: Secondary | ICD-10-CM

## 2018-01-06 DIAGNOSIS — F320 Major depressive disorder, single episode, mild: Secondary | ICD-10-CM | POA: Diagnosis not present

## 2018-01-06 MED ORDER — AZELASTINE-FLUTICASONE 137-50 MCG/ACT NA SUSP: NASAL | 2 refills | Status: DC

## 2018-01-06 MED ORDER — CYANOCOBALAMIN 1000 MCG/ML IJ SOLN
1000.0000 ug | Freq: Once | INTRAMUSCULAR | Status: AC
  Administered 2018-01-06: 1000 ug via INTRAMUSCULAR

## 2018-01-06 NOTE — Assessment & Plan Note (Addendum)
Physical today 01/06/18.  Last pap 05/30/12 - negative.  Mammogram 10/12/17 - Birads I.  Colonoscopy - one medium polyp in transverse colon.  Recommended f/u colonoscopy in one year.

## 2018-01-06 NOTE — Progress Notes (Signed)
Patient ID: Debra Hale, female   DOB: [birth year], 67 y.o.   MRN: [id number]   Subjective:    Patient ID: Debra Hale, female    DOB: 1950-07-27, 67 y.o.   MRN: [id number]  HPI  Patient here for her physical exam.  She has been seeing GI.  Started colestid.  Bowels better.  Diarrhea better.  Is planning to have f/u colonoscopy in one year.  Started on zantac.  Was having some upper symptoms from taking colestid.  This resolved with zantac.  No chest pain.  No sob.  No abdominal pain.  Recommended following liver panel.  Handling stress.  Appears to be better.  Request refill for dymista.     Past Medical History:  Diagnosis Date  . Anxiety   . Arthritis    Osteoarthritis  . Breast cancer ([place]) 2009   right breast lumpectomy with rad tx  . Colon cancer ([place])    surgery with chemo and rad tx  . GERD (gastroesophageal reflux disease)   . Hypothyroidism   . Liver disease   . Liver nodule    s/p negative biopsy  . Malignant neoplasm of thyroid gland ([place]) 2002   s/p surgery and XRT  . Nephrolithiasis    s/p lithotripsy  . Osteoporosis   . Other and unspecified hyperlipidemia   . Palpitations   . Personal history of chemotherapy   . Personal history of malignant neoplasm of large intestine    carcinoma - cecum, s/p right laparoscopic colectomy - s/p chemotherapy and XRT  . Personal history of radiation therapy   . Pure hypercholesterolemia   . Unspecified hereditary and idiopathic peripheral neuropathy    Past Surgical History:  Procedure Laterality Date  . APPENDECTOMY  1985  . BREAST BIOPSY Right 2009   positive  . BREAST BIOPSY Right 2009   negative  . BREAST LUMPECTOMY Right 2009   breast cancer  . CHOLECYSTECTOMY  1995  . COLONOSCOPY    . COLONOSCOPY WITH PROPOFOL N/A 10/29/2017   Procedure: COLONOSCOPY WITH PROPOFOL;  Surgeon: Manya Silvas, MD;  Location[facility] ENDOSCOPY;  Service: Endoscopy;  Laterality: N/A;  . DILATION AND CURETTAGE OF UTERUS  1990  .  DILATION AND CURETTAGE, DIAGNOSTIC / THERAPEUTIC  1990  . ESOPHAGOGASTRODUODENOSCOPY    . ESOPHAGOGASTRODUODENOSCOPY (EGD) WITH PROPOFOL N/A 10/29/2017   Procedure: ESOPHAGOGASTRODUODENOSCOPY (EGD) WITH PROPOFOL;  Surgeon: Manya Silvas, MD;  Location: [facility] ENDOSCOPY;  Service: Endoscopy;  Laterality: N/A;  . LAPAROSCOPIC PARTIAL COLECTOMY     stage 3-C carcinoma of the cecum, s/p chemotherapy and xrt  . LITHOTRIPSY    . SIGMOIDOSCOPY  08/26/1993  . THYROID LOBECTOMY  2002   s/p XRT   Family History  Problem Relation Age of Onset  . Stroke Mother        20s  . Alzheimer's disease Mother   . Cancer Mother   . Lung cancer Father   . Prostate cancer Father   . Cancer Father        Colon  . Colon cancer Father   . Breast cancer Sister        46's  . Lung cancer Sister   . Breast cancer Maternal Aunt    Social History   Socioeconomic History  . Marital status: Divorced    Spouse name: Not on file  . Number of children: 0  . Years of education: Not on file  . Highest education level: Not on file  Occupational History  .  Not on file  Social Needs  . Financial resource strain: Not on file  . Food insecurity:    Worry: Not on file    Inability: Not on file  . Transportation needs:    Medical: Not on file    Non-medical: Not on file  Tobacco Use  . Smoking status: Never Smoker  . Smokeless tobacco: Never Used  Substance and Sexual Activity  . Alcohol use: No    Alcohol/week: 0.0 standard drinks  . Drug use: No  . Sexual activity: Not Currently  Lifestyle  . Physical activity:    Days per week: Not on file    Minutes per session: Not on file  . Stress: Not on file  Relationships  . Social connections:    Talks on phone: Not on file    Gets together: Not on file    Attends religious service: Not on file    Active member of club or organization: Not on file    Attends meetings of clubs or organizations: Not on file    Relationship status: Not on file  Other  Topics Concern  . Not on file  Social History Narrative  . Not on file    Outpatient Encounter Medications as of 01/06/2018  Medication Sig  . Azelastine-Fluticasone (DYMISTA) 137-50 MCG/ACT SUSP One spray in each nostril bid  . clonazePAM (KLONOPIN) 0.5 MG tablet Take 0.5 mg by mouth 2 (two) times daily.  . colestipol (COLESTID) 1 g tablet TAKE 1 TABLET (1 G TOTAL) BY MOUTH ONCE DAILY MAY INCREASE DOSE TO TWICE DAILY IF NEEDED.  Marland Kitchen ergocalciferol (VITAMIN D2) 50000 units capsule Take 1 capsule (50,000 Units total) by mouth once a week.  . escitalopram (LEXAPRO) 20 MG tablet Take 20 mg by mouth daily.    Marland Kitchen KLOR-CON 10 10 MEQ tablet TAKE 1 TABLET BY MOUTH EVERY DAY  . levothyroxine (SYNTHROID, LEVOTHROID) 100 MCG tablet Take 1 tablet (100 mcg total) by mouth daily.  Marland Kitchen levothyroxine (SYNTHROID, LEVOTHROID) 88 MCG tablet TAKE 1 TABLET BY MOUTH EVERY DAY BEFORE BREAKFAST (NEED APPT)  . loperamide (IMODIUM) 1 MG/5ML solution Take 1 mg by mouth as needed for diarrhea or loose stools.  . magnesium oxide (MAG-OX) 400 MG tablet TAKE 1 TABLET BY MOUTH EVERY DAY  . pantoprazole (PROTONIX) 40 MG tablet TAKE 1 TABLET BY MOUTH EVERY DAY  . potassium chloride (K-DUR) 10 MEQ tablet Take 1 tablet (10 mEq total) by mouth daily.  . ranitidine (ZANTAC) 150 MG capsule TAKE 1 CAPSULE (150 MG TOTAL) BY MOUTH ONCE DAILY TAKE 1 TABLET 15-20 MINS BEFORE DINNER.  . [DISCONTINUED] DYMISTA 137-50 MCG/ACT SUSP USE 2 SPRAYS IN EACH NOSTRIL TWICE DAILY  . [EXPIRED] cyanocobalamin ((VITAMIN B-12)) injection 1,000 mcg    No facility-administered encounter medications on file as of 01/06/2018.     Review of Systems  Constitutional: Negative for appetite change and unexpected weight change.  HENT: Negative for congestion and sinus pressure.   Eyes: Negative for pain and visual disturbance.  Respiratory: Negative for cough, chest tightness and shortness of breath.   Cardiovascular: Negative for chest pain, palpitations and  leg swelling.  Gastrointestinal: Negative for abdominal pain, diarrhea, nausea and vomiting.  Genitourinary: Negative for difficulty urinating and dysuria.  Musculoskeletal: Negative for joint swelling and myalgias.  Skin: Negative for color change and rash.  Neurological: Negative for dizziness, light-headedness and headaches.  Hematological: Negative for adenopathy. Does not bruise/bleed easily.  Psychiatric/Behavioral: Negative for agitation and dysphoric mood.  Objective:    Physical Exam  Constitutional: She is oriented to person, place, and time. She appears well-developed and well-nourished. No distress.  HENT:  Nose: Nose normal.  Mouth/Throat: Oropharynx is clear and moist.  Eyes: Right eye exhibits no discharge. Left eye exhibits no discharge. No scleral icterus.  Neck: Neck supple. No thyromegaly present.  Cardiovascular: Normal rate and regular rhythm.  Pulmonary/Chest: Breath sounds normal. No accessory muscle usage. No tachypnea. No respiratory distress. She has no decreased breath sounds. She has no wheezes. She has no rhonchi. Right breast exhibits no inverted nipple, no mass, no nipple discharge and no tenderness (no axillary adenopathy). Left breast exhibits no inverted nipple, no mass, no nipple discharge and no tenderness (no axilarry adenopathy).  Abdominal: Soft. Bowel sounds are normal. There is no tenderness.  Musculoskeletal: She exhibits no edema or tenderness.  Lymphadenopathy:    She has no cervical adenopathy.  Neurological: She is alert and oriented to person, place, and time.  Skin: No rash noted. No erythema.  Psychiatric: She has a normal mood and affect. Her behavior is normal.    BP 118/70 (BP Location: Left Arm, Patient Position: Sitting, Cuff Size: Normal)   Pulse 86   Temp (!) 97.5 F (36.4 C) (Oral)   Resp 18   Wt 149 lb (67.6 kg)   SpO2 96%   BMI 25.58 kg/m  Wt Readings from Last 3 Encounters:  01/06/18 149 lb (67.6 kg)  10/29/17  150 lb (68 kg)  09/14/17 150 lb 12.8 oz (68.4 kg)     Lab Results  Component Value Date   WBC 3.5 (L) 09/23/2017   HGB 15.1 (H) 09/23/2017   HCT 44.2 09/23/2017   PLT 193.0 09/23/2017   GLUCOSE 93 09/23/2017   CHOL 211 (H) 09/23/2017   TRIG 167.0 (H) 09/23/2017   HDL 47.80 09/23/2017   LDLDIRECT 126.0 07/11/2015   LDLCALC 129 (H) 09/23/2017   ALT 61 (H) 11/11/2017   AST 24 11/11/2017   NA 140 09/23/2017   K 4.1 09/23/2017   CL 104 09/23/2017   CREATININE 1.09 09/23/2017   BUN 15 09/23/2017   CO2 28 09/23/2017   TSH 1.44 11/11/2017   HGBA1C 5.3 12/10/2016    Mm Screening Breast Tomo Bilateral  Result Date: 10/12/2017 CLINICAL DATA:  Screening. EXAM: DIGITAL SCREENING BILATERAL MAMMOGRAM WITH TOMO AND CAD COMPARISON:  Previous exam(s). ACR Breast Density Category c: The breast tissue is heterogeneously dense, which may obscure small masses. FINDINGS: There are no findings suspicious for malignancy. Images were processed with CAD. IMPRESSION: No mammographic evidence of malignancy. A result letter of this screening mammogram will be mailed directly to the patient. RECOMMENDATION: Screening mammogram in one year. (Code:SM-B-01Y) BI-RADS CATEGORY  1: Negative. Electronically Signed   By: Kristopher Oppenheim M.D.   On: 10/12/2017 12:03       Assessment & Plan:   Problem List Items Addressed This Visit    Abnormal liver function test    Worked up by GI.  CT - no liver mass.  Follow liver function tests.        Relevant Orders   Hepatic function panel   Anxiety    Followed by psychiatry.  Stable.  Overall appears to be doing better.        CARCINOMA, THYROID GLAND, PAPILLARY    On thyroid replacement.  Follow tsh.       Environmental allergies    Request refill on dymista.  Follow.  GERD (gastroesophageal reflux disease)    Controlled on current regimen.  Zantac recently added.        Relevant Medications   ranitidine (ZANTAC) 150 MG capsule   Health care  maintenance    Physical today 01/06/18.  Last pap 05/30/12 - negative.  Mammogram 10/12/17 - Birads I.  Colonoscopy - one medium polyp in transverse colon.  Recommended f/u colonoscopy in one year.        Hypercholesterolemia    Low cholesterol diet and exercise.  Follow lipid panel.        Relevant Medications   colestipol (COLESTID) 1 g tablet   Other Relevant Orders   CBC with Differential/Platelet   Lipid panel   Basic metabolic panel   Loose stools    Better on colestid.  Being followed by GI.       Mild depression ([place])    Followed by psychiatry.  Stable.        Stress    Overall doing better.  Sees psychiatry.       Vitamin D deficiency    Follow vitamin D level.         Other Visit Diagnoses    Routine general medical examination at a health care facility    -  Primary   B12 deficiency       Relevant Medications   cyanocobalamin ((VITAMIN B-12)) injection 1,000 mcg (Completed)       [name], MD

## 2018-01-09 ENCOUNTER — Encounter: Payer: Self-pay | Admitting: Internal Medicine

## 2018-01-09 NOTE — Assessment & Plan Note (Signed)
Followed by psychiatry.  Stable.  Overall appears to be doing better.

## 2018-01-09 NOTE — Assessment & Plan Note (Signed)
Followed by psychiatry.  Stable.  

## 2018-01-09 NOTE — Assessment & Plan Note (Signed)
On thyroid replacement.  Follow tsh.  

## 2018-01-09 NOTE — Assessment & Plan Note (Signed)
Controlled on current regimen.  Zantac recently added.

## 2018-01-09 NOTE — Assessment & Plan Note (Signed)
Follow vitamin D level.  

## 2018-01-09 NOTE — Assessment & Plan Note (Signed)
Low cholesterol diet and exercise.  Follow lipid panel.   

## 2018-01-09 NOTE — Assessment & Plan Note (Signed)
Request refill on dymista.  Follow.

## 2018-01-09 NOTE — Assessment & Plan Note (Signed)
Overall doing better.  Sees psychiatry.

## 2018-01-09 NOTE — Assessment & Plan Note (Signed)
Worked up by GI.  CT - no liver mass.  Follow liver function tests.

## 2018-01-09 NOTE — Assessment & Plan Note (Signed)
Better on colestid.  Being followed by GI.

## 2018-01-11 ENCOUNTER — Other Ambulatory Visit: Payer: Self-pay | Admitting: Internal Medicine

## 2018-01-11 DIAGNOSIS — R790 Abnormal level of blood mineral: Secondary | ICD-10-CM

## 2018-01-16 NOTE — Progress Notes (Deleted)
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  INTERVAL HISTORY:  Patient returns to clinic today for routine yearly follow-up. She offers no specific complaints today. She had a Mammogram in June which was BIRADS 1. She is highly anxious. She has no neurologic complaints. She denies any fevers. She has no chest pain or shortness of breath. She denies any nausea or vomiting. She has no urinary complaints. Patient otherwise feels well and offers no further specific complaints.  REVIEW OF SYSTEMS:   Review of Systems  Constitutional: Negative for fever, malaise/fatigue and weight loss.  HENT: Negative for ear pain and sore throat.   Respiratory: Negative.  Negative for cough and shortness of breath.   Cardiovascular: Negative.  Negative for chest pain.  Gastrointestinal: Negative for abdominal pain and diarrhea.  Genitourinary: Negative.   Musculoskeletal: Negative.   Neurological: Negative for weakness.  Psychiatric/Behavioral: The patient is nervous/anxious.     As per HPI. Otherwise, a complete review of systems is negatve.  PAST MEDICAL HISTORY: Past Medical History:  Diagnosis Date  . Anxiety   . Arthritis    Osteoarthritis  . Breast cancer ([place]) 2009   right breast lumpectomy with rad tx  . Colon cancer ([place])    surgery with chemo and rad tx  . GERD (gastroesophageal reflux disease)   . Hypothyroidism   . Liver disease   . Liver nodule    s/p negative biopsy  . Malignant neoplasm of thyroid gland ([place]) 2002   s/p surgery and XRT  . Nephrolithiasis    s/p lithotripsy  . Osteoporosis   . Other and unspecified hyperlipidemia   . Palpitations   . Personal history of chemotherapy   .  Personal history of malignant neoplasm of large intestine    carcinoma - cecum, s/p right laparoscopic colectomy - s/p chemotherapy and XRT  . Personal history of radiation therapy   . Pure hypercholesterolemia   . Unspecified hereditary and idiopathic peripheral neuropathy     PAST SURGICAL HISTORY: Past Surgical History:  Procedure Laterality Date  . APPENDECTOMY  1985  . BREAST BIOPSY Right 2009   positive  . BREAST BIOPSY Right 2009   negative  . BREAST LUMPECTOMY Right 2009   breast cancer  . CHOLECYSTECTOMY  1995  . COLONOSCOPY    . COLONOSCOPY WITH PROPOFOL N/A 10/29/2017   Procedure: COLONOSCOPY WITH PROPOFOL;  Surgeon: Manya Silvas, MD;  Location: [facility] ENDOSCOPY;  Service: Endoscopy;  Laterality: N/A;  . DILATION AND CURETTAGE OF UTERUS  1990  . DILATION AND CURETTAGE, DIAGNOSTIC / THERAPEUTIC  1990  . ESOPHAGOGASTRODUODENOSCOPY    . ESOPHAGOGASTRODUODENOSCOPY (EGD) WITH PROPOFOL N/A 10/29/2017   Procedure: ESOPHAGOGASTRODUODENOSCOPY (EGD) WITH PROPOFOL;  Surgeon: Manya Silvas, MD;  Location: [facility] ENDOSCOPY;  Service: Endoscopy;  Laterality: N/A;  . LAPAROSCOPIC PARTIAL COLECTOMY     stage 3-C carcinoma of the cecum, s/p chemotherapy and xrt  . LITHOTRIPSY    . SIGMOIDOSCOPY  08/26/1993  . THYROID LOBECTOMY  2002   s/p XRT    FAMILY HISTORY: Family History  Problem Relation Age of Onset  . Stroke Mother        93s  . Alzheimer's disease Mother   . Cancer Mother   . Lung cancer Father   .  Prostate cancer Father   . Cancer Father        Colon  . Colon cancer Father   . Breast cancer Sister        41's  . Lung cancer Sister   . Breast cancer Maternal Aunt        ADVANCED DIRECTIVES (Y/N):  N   HEALTH MAINTENANCE: Social History   Tobacco Use  . Smoking status: Never Smoker  . Smokeless tobacco: Never Used  Substance Use Topics  . Alcohol use: No    Alcohol/week: 0.0 standard drinks  . Drug use: No     Colonoscopy:  PAP:  Bone  density:  Lipid panel:  Allergies  Allergen Reactions  . Demeclocycline Other (See Comments)    Throat swells  . Tetracyclines & Related Other (See Comments)    Throat swells   . Augmentin [Amoxicillin-Pot Clavulanate] Diarrhea  . Bentyl [Dicyclomine Hcl]   . Ciprofloxacin Diarrhea  . Codeine Other (See Comments)    dizziness    . Epinephrine   . Flagyl [Metronidazole]   . Librax [Chlordiazepoxide-Clidinium]   . Phenobarbital   . Ultram [Tramadol] Other (See Comments)    Sick feeling    Current Outpatient Medications  Medication Sig Dispense Refill  . Azelastine-Fluticasone (DYMISTA) 137-50 MCG/ACT SUSP One spray in each nostril bid 23 g 2  . clonazePAM (KLONOPIN) 0.5 MG tablet Take 0.5 mg by mouth 2 (two) times daily.  0  . colestipol (COLESTID) 1 g tablet TAKE 1 TABLET (1 G TOTAL) BY MOUTH ONCE DAILY MAY INCREASE DOSE TO TWICE DAILY IF NEEDED.  3  . ergocalciferol (VITAMIN D2) 50000 units capsule Take 1 capsule (50,000 Units total) by mouth once a week. 4 capsule 3  . escitalopram (LEXAPRO) 20 MG tablet Take 20 mg by mouth daily.      Marland Kitchen KLOR-CON 10 10 MEQ tablet TAKE 1 TABLET BY MOUTH EVERY DAY 90 tablet 1  . levothyroxine (SYNTHROID, LEVOTHROID) 100 MCG tablet Take 1 tablet (100 mcg total) by mouth daily. 90 tablet 3  . levothyroxine (SYNTHROID, LEVOTHROID) 88 MCG tablet TAKE 1 TABLET BY MOUTH EVERY DAY BEFORE BREAKFAST (NEED APPT) 90 tablet 1  . loperamide (IMODIUM) 1 MG/5ML solution Take 1 mg by mouth as needed for diarrhea or loose stools.    . magnesium oxide (MAG-OX) 400 MG tablet TAKE 1 TABLET BY MOUTH EVERY DAY 90 tablet 1  . pantoprazole (PROTONIX) 40 MG tablet TAKE 1 TABLET BY MOUTH EVERY DAY 90 tablet 2  . potassium chloride (K-DUR) 10 MEQ tablet Take 1 tablet (10 mEq total) by mouth daily. 30 tablet 1  . ranitidine (ZANTAC) 150 MG capsule TAKE 1 CAPSULE (150 MG TOTAL) BY MOUTH ONCE DAILY TAKE 1 TABLET 15-20 MINS BEFORE DINNER.  1   No current  facility-administered medications for this visit.     OBJECTIVE: There were no vitals filed for this visit.   There is no height or weight on file to calculate BMI.    ECOG FS:0 - Asymptomatic  General: Well-developed, well-nourished, no acute distress. Eyes: Pink conjunctiva, anicteric sclera. HEENT: Normocephalic, moist mucous membranes, clear oropharnyx. Breasts: Normal breast exam. Right breast small scar from lumpectomy noted. Lungs: Clear to auscultation bilaterally. Heart: Regular rate and rhythm. No rubs, murmurs, or gallops. Abdomen: Soft, nontender, nondistended. No organomegaly noted, normoactive bowel sounds. Musculoskeletal: No edema, cyanosis, or clubbing. Neuro: Alert, answering all questions appropriately. Cranial nerves grossly intact. Skin: No rashes or petechiae noted. Psych: Normal affect.  LAB RESULTS:  Lab Results  Component Value Date   NA 140 09/23/2017   K 4.1 09/23/2017   CL 104 09/23/2017   CO2 28 09/23/2017   GLUCOSE 93 09/23/2017   BUN 15 09/23/2017   CREATININE 1.09 09/23/2017   CALCIUM 9.3 09/23/2017   PROT 6.3 11/11/2017   ALBUMIN 3.7 11/11/2017   AST 24 11/11/2017   ALT 61 (H) 11/11/2017   ALKPHOS 84 11/11/2017   BILITOT 0.5 11/11/2017   GFRNONAA 54 (L) 01/02/2016   GFRAA >60 01/02/2016    Lab Results  Component Value Date   WBC 3.5 (L) 09/23/2017   NEUTROABS 2.1 09/23/2017   HGB 15.1 (H) 09/23/2017   HCT 44.2 09/23/2017   MCV 87.0 09/23/2017   PLT 193.0 09/23/2017     STUDIES: No results found.  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  PLAN:    1. 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 Patient completed 5 years of hormonal therapy in approximately June 2015. Currently, she has no evidence of  disease. Her most recent mammogram on October 07, 2016 was reported as BI-RADS 1. Repeat in June 2019. Return to clinic in 1 year for routine evaluation.  Patient expressed understanding and was in agreement with this plan. She also understands that She can call clinic at any time with any questions, concerns, or complaints.   Cancer Staging History of breast cancer Staging form: Breast, AJCC 7th Edition - Clinical stage from 01/07/2016: Stage IA (T1c, N0, M0) - Signed by Lloyd Huger, MD on 01/07/2016   Lloyd Huger, MD   01/16/2018 3:58 PM

## 2018-01-20 ENCOUNTER — Ambulatory Visit: Payer: Self-pay | Admitting: Oncology

## 2018-01-26 ENCOUNTER — Telehealth: Payer: Self-pay | Admitting: Internal Medicine

## 2018-01-26 NOTE — Telephone Encounter (Signed)
Copied from [place] [id number]. Topic: Quick Communication - See Telephone Encounter >> Jan 26, 2018  3:32 PM Percell Belt A wrote: CRM for notification. See Telephone encounter for: 01/26/18.  Pt called in and stated that the Azelastine-Fluticasone [facility]) 137-50 MCG/ACT SUSP [[id number]]   Is no longer working for her and would like to to try Flonase   She is also requesting a refill on potassium chloride (K-DUR) 10 MEQ tablet [place] on [employer] call back number [phone]

## 2018-01-27 NOTE — Telephone Encounter (Signed)
OK to switch to flonase.

## 2018-01-27 NOTE — Telephone Encounter (Signed)
The dymista nasal spray is a combination of flonase and astelin.  I am ok if she uses flonase, but this nasal spray has flonase in the spray.  She can get flonase otc.  flonase nasal spray - 2 sprays each nostril one time per day.  Do this in the evening.

## 2018-01-27 NOTE — Telephone Encounter (Signed)
Advised patient of below and she stated she would try the regular flonase

## 2018-02-03 DIAGNOSIS — J019 Acute sinusitis, unspecified: Secondary | ICD-10-CM | POA: Diagnosis not present

## 2018-02-03 DIAGNOSIS — B9689 Other specified bacterial agents as the cause of diseases classified elsewhere: Secondary | ICD-10-CM | POA: Diagnosis not present

## 2018-02-07 DIAGNOSIS — R112 Nausea with vomiting, unspecified: Secondary | ICD-10-CM | POA: Diagnosis not present

## 2018-02-07 DIAGNOSIS — J22 Unspecified acute lower respiratory infection: Secondary | ICD-10-CM | POA: Diagnosis not present

## 2018-02-07 DIAGNOSIS — R05 Cough: Secondary | ICD-10-CM | POA: Diagnosis not present

## 2018-02-10 ENCOUNTER — Other Ambulatory Visit: Payer: Self-pay

## 2018-02-10 ENCOUNTER — Ambulatory Visit: Payer: Self-pay

## 2018-02-15 DIAGNOSIS — R05 Cough: Secondary | ICD-10-CM | POA: Diagnosis not present

## 2018-02-15 DIAGNOSIS — J22 Unspecified acute lower respiratory infection: Secondary | ICD-10-CM | POA: Diagnosis not present

## 2018-02-28 DIAGNOSIS — R0789 Other chest pain: Secondary | ICD-10-CM | POA: Diagnosis not present

## 2018-02-28 DIAGNOSIS — J019 Acute sinusitis, unspecified: Secondary | ICD-10-CM | POA: Diagnosis not present

## 2018-02-28 DIAGNOSIS — B9689 Other specified bacterial agents as the cause of diseases classified elsewhere: Secondary | ICD-10-CM | POA: Diagnosis not present

## 2018-03-11 DIAGNOSIS — R110 Nausea: Secondary | ICD-10-CM | POA: Diagnosis not present

## 2018-03-11 DIAGNOSIS — K30 Functional dyspepsia: Secondary | ICD-10-CM | POA: Diagnosis not present

## 2018-03-11 DIAGNOSIS — K219 Gastro-esophageal reflux disease without esophagitis: Secondary | ICD-10-CM | POA: Diagnosis not present

## 2018-03-11 DIAGNOSIS — J019 Acute sinusitis, unspecified: Secondary | ICD-10-CM | POA: Diagnosis not present

## 2018-03-15 ENCOUNTER — Encounter: Payer: Self-pay | Admitting: Family Medicine

## 2018-03-15 ENCOUNTER — Ambulatory Visit (INDEPENDENT_AMBULATORY_CARE_PROVIDER_SITE_OTHER): Payer: PPO

## 2018-03-15 ENCOUNTER — Ambulatory Visit (INDEPENDENT_AMBULATORY_CARE_PROVIDER_SITE_OTHER): Payer: PPO | Admitting: Family Medicine

## 2018-03-15 VITALS — BP 142/88 | HR 110 | Temp 102.0°F | Ht 64.5 in | Wt 148.0 lb

## 2018-03-15 DIAGNOSIS — M25511 Pain in right shoulder: Secondary | ICD-10-CM

## 2018-03-15 DIAGNOSIS — E876 Hypokalemia: Secondary | ICD-10-CM | POA: Diagnosis not present

## 2018-03-15 DIAGNOSIS — M19012 Primary osteoarthritis, left shoulder: Secondary | ICD-10-CM | POA: Diagnosis not present

## 2018-03-15 DIAGNOSIS — R52 Pain, unspecified: Secondary | ICD-10-CM

## 2018-03-15 DIAGNOSIS — R110 Nausea: Secondary | ICD-10-CM | POA: Diagnosis not present

## 2018-03-15 DIAGNOSIS — S299XXA Unspecified injury of thorax, initial encounter: Secondary | ICD-10-CM | POA: Diagnosis not present

## 2018-03-15 DIAGNOSIS — R509 Fever, unspecified: Secondary | ICD-10-CM

## 2018-03-15 DIAGNOSIS — M25512 Pain in left shoulder: Secondary | ICD-10-CM

## 2018-03-15 DIAGNOSIS — R911 Solitary pulmonary nodule: Secondary | ICD-10-CM

## 2018-03-15 DIAGNOSIS — S4991XA Unspecified injury of right shoulder and upper arm, initial encounter: Secondary | ICD-10-CM | POA: Diagnosis not present

## 2018-03-15 LAB — CBC WITH DIFFERENTIAL/PLATELET
BASOS ABS: 0.1 10*3/uL (ref 0.0–0.1)
Basophils Relative: 1.3 % (ref 0.0–3.0)
EOS ABS: 0 10*3/uL (ref 0.0–0.7)
Eosinophils Relative: 0.2 % (ref 0.0–5.0)
HCT: 42.2 % (ref 36.0–46.0)
Hemoglobin: 14.4 g/dL (ref 12.0–15.0)
LYMPHS ABS: 0.9 10*3/uL (ref 0.7–4.0)
Lymphocytes Relative: 16.5 % (ref 12.0–46.0)
MCHC: 34.3 g/dL (ref 30.0–36.0)
MCV: 87.1 fl (ref 78.0–100.0)
MONO ABS: 0.3 10*3/uL (ref 0.1–1.0)
Monocytes Relative: 5.8 % (ref 3.0–12.0)
NEUTROS PCT: 76.2 % (ref 43.0–77.0)
Neutro Abs: 4.3 10*3/uL (ref 1.4–7.7)
Platelets: 192 10*3/uL (ref 150.0–400.0)
RBC: 4.84 Mil/uL (ref 3.87–5.11)
RDW: 15.2 % (ref 11.5–15.5)
WBC: 5.7 10*3/uL (ref 4.0–10.5)

## 2018-03-15 LAB — COMPREHENSIVE METABOLIC PANEL
ALK PHOS: 85 U/L (ref 39–117)
ALT: 46 U/L — ABNORMAL HIGH (ref 0–35)
AST: 16 U/L (ref 0–37)
Albumin: 3.9 g/dL (ref 3.5–5.2)
BUN: 10 mg/dL (ref 6–23)
CO2: 28 meq/L (ref 19–32)
Calcium: 9.2 mg/dL (ref 8.4–10.5)
Chloride: 101 mEq/L (ref 96–112)
Creatinine, Ser: 0.97 mg/dL (ref 0.40–1.20)
GFR: 60.64 mL/min (ref >60.00)
GLUCOSE: 104 mg/dL — AB (ref 70–99)
POTASSIUM: 3.8 meq/L (ref 3.5–5.1)
Sodium: 141 mEq/L (ref 135–145)
TOTAL PROTEIN: 6.4 g/dL (ref 6.0–8.3)
Total Bilirubin: 1.3 mg/dL — ABNORMAL HIGH (ref 0.2–1.2)

## 2018-03-15 MED ORDER — POTASSIUM CHLORIDE ER 10 MEQ PO TBCR: 10.0000 meq | EXTENDED_RELEASE_TABLET | Freq: Every day | ORAL | 1 refills | Status: DC

## 2018-03-15 MED ORDER — ONDANSETRON 4 MG PO TBDP: 4.0000 mg | ORAL_TABLET | Freq: Three times a day (TID) | ORAL | 0 refills | Status: DC | PRN

## 2018-03-15 MED ORDER — ONDANSETRON HCL 4 MG PO TABS: 8.0000 mg | ORAL_TABLET | Freq: Once | ORAL | Status: DC

## 2018-03-15 NOTE — Patient Instructions (Signed)
Flu test negative  Finish amoxicillin as prescribed  Labs today  Xrays today

## 2018-03-15 NOTE — Progress Notes (Signed)
Subjective:    Patient ID: BRIELYNN SEKULA, female    DOB: 07/09/1950, 67 y.o.   MRN: [id number]  HPI  Patient presents to clinic not feeling well, bilateral shoulder pain, generalized achiness, nasal congestion for 3 to 4 weeks.  She currently is on amoxicillin 500 mg 3 times daily that was prescribed by the walk-in clinic for sinus infection.  Patient has taken 2 days worth of the amoxicillin, but continues to not feel well.   Denies cough, denies chest pain, denies vomiting or diarrhea.  Does report some nausea.  Has hx of hypokalemia and needs Rx refilled Patient Active Problem List   Diagnosis Date Noted  . Abdominal pain 03/06/2016  . Acute diarrhea 01/10/2016  . Neck nodule 10/13/2015  . Vitamin D deficiency 07/27/2015  . Loose stools 04/14/2015  . Mild depression ([place]) 02/02/2015  . Health care maintenance 06/17/2014  . Stress 01/28/2014  . Acute pericarditis 05/31/2013  . Environmental allergies 02/25/2013  . Left elbow pain 02/25/2013  . Abnormal liver function test 12/07/2012  . Anxiety 05/06/2012  . GERD (gastroesophageal reflux disease) 05/06/2012  . Osteoporosis 05/06/2012  . History of breast cancer 09/18/2008  . 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 Hypercholesterolemia 09/18/2008  . Peripheral neuropathy 09/18/2008  . Palpitations 09/18/2008  . History of malignant neoplasm of large intestine 09/18/2008   Social History   Tobacco Use  . Smoking status: Never Smoker  . Smokeless tobacco: Never Used  Substance Use Topics  . Alcohol use: No    Alcohol/week: 0.0 standard drinks   Review of Systems   Constitutional: +chills, fatigue and fever.  HENT: +congestion, runny nose  Eyes: Negative.   Respiratory: Negative for cough, shortness of breath and wheezing.   Cardiovascular: Negative for chest pain, palpitations and leg swelling.  Gastrointestinal: Negative for abdominal pain, diarrhea, nausea and vomiting.  Genitourinary: Negative for  dysuria, frequency and urgency.  Musculoskeletal: +shoulder pain, generalized aches Skin: Negative for color change, pallor and rash.  Neurological: Negative for syncope, light-headedness and headaches.  Psychiatric/Behavioral: The patient is not nervous/anxious.       Objective:   Physical Exam  Constitutional: She is oriented to person, place, and time.  Appears tired  HENT:  Head: Normocephalic and atraumatic.  Eyes: Pupils are equal, round, and reactive to light. Conjunctivae and EOM are normal. No scleral icterus.  Neck: Neck supple. No tracheal deviation present.  Cardiovascular: Regular rhythm and normal heart sounds.  tachycardia  Pulmonary/Chest: Effort normal and breath sounds normal. No respiratory distress.  Clavicle area bilaterally tender with palpation. No obvious swollen lymphnode, whole area is tender    Musculoskeletal: She exhibits no edema.  ROM of bilat shoulders normal, just aches with all movement  Neurological: She is alert and oriented to person, place, and time.  Skin: Skin is warm and dry. No erythema. No pallor.  Psychiatric: She has a normal mood and affect. Her behavior is normal.      Vitals:   03/15/18 1415  BP: (!) 142/88  Pulse: (!) 110  Temp: (!) 102 F (38.9 C)  SpO2: 92%   Assessment & Plan:   Fever with chills, bilateral shoulder pain, generalized body aches- patient will take Tylenol alternating with Motrin to keep fever under control.  We will get CBC and CMP drawn in clinic today.  We will also get x-rays of chest and both shoulders to further investigate pain and as well as fever.  Patient has a history of thyroid  cancer, and I am concerned that she is continuing to have fevers and generalized aches even with treatment of sinus infection with amoxicillin.  I discussed with patient that if her symptoms continue, we most likely will have to refer her back to hematology/oncology for further investigation into what is  happening.  Nausea-patient given 8 mg Zofran x1 in clinic.  Zofran ODT Rx sent to pharmacy.  Hypokalemia- potassium supplement refill given.  Patient aware that if her pain worsens, fever does not respond to Tylenol treatment, symptoms increase in any way she is to go to emergency department right away.

## 2018-03-16 ENCOUNTER — Encounter: Payer: Self-pay | Admitting: Family Medicine

## 2018-03-16 ENCOUNTER — Telehealth: Payer: Self-pay

## 2018-03-16 MED ORDER — POTASSIUM CHLORIDE ER 10 MEQ PO TBCR: 10.0000 meq | EXTENDED_RELEASE_TABLET | Freq: Every day | ORAL | 1 refills | Status: DC

## 2018-03-16 NOTE — Telephone Encounter (Signed)
Call report from Opal Sidles at [facility] - chest x-ray from yesterday. Sarah notified.

## 2018-03-16 NOTE — Telephone Encounter (Signed)
Debra Hale ordered these tests for this pt.

## 2018-03-16 NOTE — Addendum Note (Signed)
Addended by: Philis Nettle on: 03/16/2018 02:54 PM   Modules accepted: Orders

## 2018-03-16 NOTE — Telephone Encounter (Signed)
Please advise 

## 2018-03-18 ENCOUNTER — Other Ambulatory Visit: Payer: Self-pay

## 2018-03-18 ENCOUNTER — Ambulatory Visit (INDEPENDENT_AMBULATORY_CARE_PROVIDER_SITE_OTHER): Payer: PPO

## 2018-03-18 DIAGNOSIS — R911 Solitary pulmonary nodule: Secondary | ICD-10-CM | POA: Diagnosis not present

## 2018-03-18 DIAGNOSIS — R918 Other nonspecific abnormal finding of lung field: Secondary | ICD-10-CM | POA: Diagnosis not present

## 2018-03-18 DIAGNOSIS — J40 Bronchitis, not specified as acute or chronic: Secondary | ICD-10-CM

## 2018-03-21 ENCOUNTER — Telehealth: Payer: Self-pay | Admitting: Internal Medicine

## 2018-03-21 NOTE — Telephone Encounter (Signed)
Copied from [place] [id number]. Topic: Quick Communication - See Telephone Encounter >> Mar 21, 2018  2:24 PM Sheran Luz wrote: CRM for notification. See Telephone encounter for: 03/21/18.  Patient called to check status of imaging results from 11/15. Please advise.

## 2018-03-22 MED ORDER — ALBUTEROL SULFATE HFA 108 (90 BASE) MCG/ACT IN AERS: 2.0000 | INHALATION_SPRAY | Freq: Four times a day (QID) | RESPIRATORY_TRACT | 0 refills | Status: DC | PRN

## 2018-03-22 MED ORDER — PREDNISONE 10 MG PO TABS: 10.0000 mg | ORAL_TABLET | Freq: Every day | ORAL | 0 refills | Status: AC

## 2018-03-22 NOTE — Telephone Encounter (Signed)
Patient has been notified of results by office

## 2018-03-24 ENCOUNTER — Ambulatory Visit (INDEPENDENT_AMBULATORY_CARE_PROVIDER_SITE_OTHER): Payer: PPO | Admitting: Family Medicine

## 2018-03-24 ENCOUNTER — Encounter: Payer: Self-pay | Admitting: Family Medicine

## 2018-03-24 VITALS — BP 122/76 | HR 93 | Temp 98.1°F | Ht 64.5 in | Wt 148.8 lb

## 2018-03-24 DIAGNOSIS — J329 Chronic sinusitis, unspecified: Secondary | ICD-10-CM | POA: Diagnosis not present

## 2018-03-24 DIAGNOSIS — R221 Localized swelling, mass and lump, neck: Secondary | ICD-10-CM | POA: Diagnosis not present

## 2018-03-24 DIAGNOSIS — M898X1 Other specified disorders of bone, shoulder: Secondary | ICD-10-CM

## 2018-03-24 NOTE — Progress Notes (Signed)
Subjective:    Patient ID: Debra Hale, female    DOB: 10/17/1950, 67 y.o.   MRN: [id number]  HPI  Presents to clinic c/o continued pain across clavicle area.  Patient is feeling somewhat better when compared to visit on 11/12.  She no longer has fever or chills.  States the prednisone has been really helpful to improve her achy symptoms as well as congestion/bronchitis.  Original chest x-ray we did at last visit showed a possible nodule, we did repeat of x-ray which showed that the lung nodule is no longer there, so it is suspected that the first x-ray had a artifact.  Second x-ray read did show some bronchitis type changes, so this is why patient was getting prednisone and albuterol.  CBC and CMP done at last visit were also stable.  X-rays of shoulders done at last visit did not reveal any abnormality.  Patient also has been suffering from a chronic sinus infection throughout the entire month of October, and also today reports she still feels congestion in her sinuses.  Patient also reports a lump that is on the right side of her neck, states that has been there off and not as far she knows for many years.   Patient Active Problem List   Diagnosis Date Noted  . Abdominal pain 03/06/2016  . Acute diarrhea 01/10/2016  . Neck nodule 10/13/2015  . Vitamin D deficiency 07/27/2015  . Loose stools 04/14/2015  . Mild depression ([place]) 02/02/2015  . Health care maintenance 06/17/2014  . Stress 01/28/2014  . Acute pericarditis 05/31/2013  . Environmental allergies 02/25/2013  . Left elbow pain 02/25/2013  . Abnormal liver function test 12/07/2012  . Anxiety 05/06/2012  . GERD (gastroesophageal reflux disease) 05/06/2012  . Osteoporosis 05/06/2012  . History of breast cancer 09/18/2008  . 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 Hypercholesterolemia 09/18/2008  . Peripheral neuropathy 09/18/2008  . Palpitations 09/18/2008  . History of malignant neoplasm of large intestine  09/18/2008   Social History   Tobacco Use  . Smoking status: Never Smoker  . Smokeless tobacco: Never Used  Substance Use Topics  . Alcohol use: No    Alcohol/week: 0.0 standard drinks     Review of Systems  Constitutional: Negative for chills, fatigue and fever.  HENT: +sinus pain, sinus congestion. +pain in clavicle area bilat. Eyes: Negative.   Respiratory: Negative for cough, shortness of breath and wheezing.   Cardiovascular: Negative for chest pain, palpitations and leg swelling.  Gastrointestinal: Negative for abdominal pain, diarrhea, nausea and vomiting.  Genitourinary: Negative for dysuria, frequency and urgency.  Musculoskeletal: +aches, clavicle area. Skin: Negative for color change, pallor and rash.  Neurological: Negative for syncope, light-headedness and headaches.  Psychiatric/Behavioral: The patient is not nervous/anxious.       Objective:   Physical Exam  Neck: Neck supple. No tracheal deviation present.    Soft lump on right side of neck approximately size of golf ball, question lipoma -this location is represented by red circle on diagram.  Area of tenderness on bilateral clavicle region is represented by blue circle on diagram.  No obvious mass or thyromegaly palpated on thyroid.  Cardiovascular: Normal rate, regular rhythm and normal heart sounds.  Pulmonary/Chest: Effort normal and breath sounds normal. No respiratory distress. She has no wheezes. She has no rales.  Skin: Skin is warm and dry. Capillary refill takes less than 2 seconds. No pallor.  Psychiatric: She has a normal mood and affect. Her behavior is normal.  Nursing note and vitals reviewed.     Vitals:   03/24/18 1419  BP: 122/76  Pulse: 93  Temp: 98.1 F (36.7 C)  SpO2: 96%   Assessment & Plan:   Localized swelling/mass lump of neck/pain in clavicle area - we will get CT soft tissue of neck to further evaluate.  Patient will also see ENT for evaluation of this pain.  I was  considering doing x-ray of the clavicle region, but after discussion with our radiology technician we both decided that a CT of soft tissue neck would be better for the type of pain patient is having and also the lump on the right side of her neck as well as her history of thyroid cancer.  Chronic sinusitis- ENT will be able to evaluate patient for her chronic sinus pressure/sinusitis as well.  Patient aware someone will contact her in regards to setting of the CT scan as well as her ENT referral.  Patient will keep regularly scheduled follow-up with PCP as planned.  Advised she can return to clinic sooner if any issues arise or if her current symptoms worsen.

## 2018-03-29 ENCOUNTER — Telehealth: Payer: Self-pay | Admitting: Internal Medicine

## 2018-03-29 DIAGNOSIS — M898X1 Other specified disorders of bone, shoulder: Secondary | ICD-10-CM

## 2018-03-29 DIAGNOSIS — R221 Localized swelling, mass and lump, neck: Secondary | ICD-10-CM

## 2018-03-29 MED ORDER — PREDNISONE 10 MG PO TABS: 10.0000 mg | ORAL_TABLET | Freq: Every day | ORAL | 0 refills | Status: DC

## 2018-03-29 NOTE — Telephone Encounter (Signed)
Called pt to inquire about symptoms. Left VM to return call to office.

## 2018-03-29 NOTE — Telephone Encounter (Signed)
Prednisone 10mg  daily for 5 days sent in for patient

## 2018-03-29 NOTE — Telephone Encounter (Signed)
Patient notified that prescription was sent in.

## 2018-03-29 NOTE — Telephone Encounter (Signed)
Copied from [place] [id number]. Topic: Quick Communication - Rx Refill/Question >> Mar 29, 2018 11:37 AM Ahmed Prima L wrote: Medication: predniSONE (DELTASONE) 10 MG tablet [[id number]]  ENDED   Has the patient contacted their pharmacy? Yes, they told her it would be Friday before they would send the request. (Agent: If no, request that the patient contact the pharmacy for the refill.) (Agent: If yes, when and what did the pharmacy advise?)  Preferred Pharmacy (with phone number or street name): [id number] [address], [place] [address]    Agent: Please be advised that RX refills may take up to 3 business days. We ask that you follow-up with your pharmacy.

## 2018-04-05 DIAGNOSIS — R51 Headache: Secondary | ICD-10-CM | POA: Diagnosis not present

## 2018-04-05 DIAGNOSIS — J309 Allergic rhinitis, unspecified: Secondary | ICD-10-CM | POA: Diagnosis not present

## 2018-04-08 ENCOUNTER — Other Ambulatory Visit: Payer: Self-pay | Admitting: Internal Medicine

## 2018-04-08 ENCOUNTER — Ambulatory Visit
Admission: RE | Admit: 2018-04-08 | Discharge: 2018-04-08 | Disposition: A | Discharge status: Home / Self Care | Payer: PPO | Source: Ambulatory Visit | Attending: Family Medicine | Admitting: Family Medicine

## 2018-04-08 DIAGNOSIS — J329 Chronic sinusitis, unspecified: Secondary | ICD-10-CM | POA: Insufficient documentation

## 2018-04-08 DIAGNOSIS — R221 Localized swelling, mass and lump, neck: Secondary | ICD-10-CM | POA: Diagnosis not present

## 2018-04-08 MED ORDER — IOPAMIDOL (ISOVUE-300) INJECTION 61%
75.0000 mL | Freq: Once | INTRAVENOUS | Status: AC | PRN
  Administered 2018-04-08: 75 mL via INTRAVENOUS

## 2018-04-14 DIAGNOSIS — H5713 Ocular pain, bilateral: Secondary | ICD-10-CM | POA: Diagnosis not present

## 2018-04-18 ENCOUNTER — Ambulatory Visit (INDEPENDENT_AMBULATORY_CARE_PROVIDER_SITE_OTHER): Payer: PPO | Admitting: Family

## 2018-04-18 ENCOUNTER — Encounter: Payer: Self-pay | Admitting: Family

## 2018-04-18 VITALS — BP 98/60 | HR 97 | Temp 97.7°F | Wt 141.8 lb

## 2018-04-18 DIAGNOSIS — R51 Headache: Secondary | ICD-10-CM

## 2018-04-18 DIAGNOSIS — R5383 Other fatigue: Secondary | ICD-10-CM | POA: Insufficient documentation

## 2018-04-18 DIAGNOSIS — J918 Pleural effusion in other conditions classified elsewhere: Secondary | ICD-10-CM

## 2018-04-18 DIAGNOSIS — J189 Pneumonia, unspecified organism: Secondary | ICD-10-CM | POA: Diagnosis not present

## 2018-04-18 DIAGNOSIS — R519 Headache, unspecified: Secondary | ICD-10-CM | POA: Insufficient documentation

## 2018-04-18 DIAGNOSIS — B370 Candidal stomatitis: Secondary | ICD-10-CM | POA: Diagnosis not present

## 2018-04-18 LAB — COMPREHENSIVE METABOLIC PANEL
ALBUMIN: 3.7 g/dL (ref 3.5–5.2)
ALK PHOS: 76 U/L (ref 39–117)
ALT: 38 U/L — ABNORMAL HIGH (ref 0–35)
AST: 9 U/L (ref 0–37)
BILIRUBIN TOTAL: 1 mg/dL (ref 0.2–1.2)
BUN: 25 mg/dL — AB (ref 6–23)
CO2: 27 mEq/L (ref 19–32)
Calcium: 9.2 mg/dL (ref 8.4–10.5)
Chloride: 102 mEq/L (ref 96–112)
Creatinine, Ser: 1.05 mg/dL (ref 0.40–1.20)
GFR: 55.32 mL/min — AB (ref >60.00)
Glucose, Bld: 102 mg/dL — ABNORMAL HIGH (ref 70–99)
POTASSIUM: 3.9 meq/L (ref 3.5–5.1)
Sodium: 139 mEq/L (ref 135–145)
Total Protein: 6.1 g/dL (ref 6.0–8.3)

## 2018-04-18 LAB — TSH: TSH: 20.43 u[IU]/mL — AB (ref 0.35–4.50)

## 2018-04-18 LAB — B12 AND FOLATE PANEL
Folate: 15.9 ng/mL (ref >5.9)
Vitamin B-12: 185 pg/mL — ABNORMAL LOW (ref 211–911)

## 2018-04-18 LAB — VITAMIN D 25 HYDROXY (VIT D DEFICIENCY, FRACTURES): VITD: 18.88 ng/mL — AB (ref 30.00–100.00)

## 2018-04-18 MED ORDER — CLOTRIMAZOLE 10 MG MT TROC: 10.0000 mg | Freq: Three times a day (TID) | OROMUCOSAL | 0 refills | Status: AC

## 2018-04-18 NOTE — Assessment & Plan Note (Signed)
Symptoms and onset after antibiotic therapy consistent with thrush.  Will treat empirically.  Patient will let me know if not better.

## 2018-04-18 NOTE — Progress Notes (Signed)
Subjective:    Patient ID: Debra Hale, female    DOB: Mar 08, 1951, 67 y.o.   MRN: [id number]  CC: Debra Hale is a 67 y.o. female who presents today for an acute visit.    HPI: Here today has had patch on tongue which prompted OV today.   Complains of fatigue. Would like to know if thyroid is planning role. Endorses cold intolerance. No exercise.   Feels like depression has worsened since being sick. Prior to this, regimen has been stable. Taking klonopin half tablet during the day, whole tablet at bedtime. Taking lexapro 20 mg. No hi/si. No suicide attempts. Feels down.   PNA 2 months ago per patient, diagnosed at urgent care.  No cough, fever, wheezing, CP, left arm pain or jaw pain.   Never smoker.   Inhalers which hasnt needed since .   Patch on tongue noticed with onset on antiobitiocs. Poor sense of taste. Thin runny drainge.   HA for months, unchanged. Top of head, between eyes.   Cannot sleep laying down due to HA. HA starts when laying down. No vision dimming.  HA resolves with advil. No h/o headaches. Complains of getting dizzy since last week.Ears are ringing for past couple of months, unsure of which ear. Thinks has hearing loss , not sure which ear. No drainage from ears. No vertigo.   Saw Dr Tami Ribas couple of weeks ago and told to see ophthalmology. Normal eye exam.    Hypothyroidism - compliant with synthroid. Not missing doses.    Patient seen in our office 03/24/2018. Referral to ENT, CT neck CT soft tissue- no mass seen. H/o thyroidectomy 03/18/18 - stable amount of pleural thickening. Stable cardiomegaly of COPD, chronic bronchitis.  Echo 05/2016 echo normal. Stress test normal 2017.  UTD mammogram, colonscopy   HISTORY:  Past Medical History:  Diagnosis Date  . Anxiety   . Arthritis    Osteoarthritis  . Breast cancer ([place]) 2009   right breast lumpectomy with rad tx  . Colon cancer ([place])    surgery with chemo and rad tx  . GERD  (gastroesophageal reflux disease)   . Hypothyroidism   . Liver disease   . Liver nodule    s/p negative biopsy  . Malignant neoplasm of thyroid gland ([place]) 2002   s/p surgery and XRT  . Nephrolithiasis    s/p lithotripsy  . Osteoporosis   . Other and unspecified hyperlipidemia   . Palpitations   . Personal history of chemotherapy   . Personal history of malignant neoplasm of large intestine    carcinoma - cecum, s/p right laparoscopic colectomy - s/p chemotherapy and XRT  . Personal history of radiation therapy   . Pure hypercholesterolemia   . Unspecified hereditary and idiopathic peripheral neuropathy    Past Surgical History:  Procedure Laterality Date  . APPENDECTOMY  1985  . BREAST BIOPSY Right 2009   positive  . BREAST BIOPSY Right 2009   negative  . BREAST LUMPECTOMY Right 2009   breast cancer  . CHOLECYSTECTOMY  1995  . COLONOSCOPY    . COLONOSCOPY WITH PROPOFOL N/A 10/29/2017   Procedure: COLONOSCOPY WITH PROPOFOL;  Surgeon: Manya Silvas, MD;  Location[facility] ENDOSCOPY;  Service: Endoscopy;  Laterality: N/A;  . DILATION AND CURETTAGE OF UTERUS  1990  . DILATION AND CURETTAGE, DIAGNOSTIC / THERAPEUTIC  1990  . ESOPHAGOGASTRODUODENOSCOPY    . ESOPHAGOGASTRODUODENOSCOPY (EGD) WITH PROPOFOL N/A 10/29/2017   Procedure: ESOPHAGOGASTRODUODENOSCOPY (EGD) WITH PROPOFOL;  Surgeon:  Manya Silvas, MD;  Location: [facility] ENDOSCOPY;  Service: Endoscopy;  Laterality: N/A;  . LAPAROSCOPIC PARTIAL COLECTOMY     stage 3-C carcinoma of the cecum, s/p chemotherapy and xrt  . LITHOTRIPSY    . SIGMOIDOSCOPY  08/26/1993  . THYROID LOBECTOMY  2002   s/p XRT   Family History  Problem Relation Age of Onset  . Stroke Mother        53s  . Alzheimer's disease Mother   . Cancer Mother   . Lung cancer Father   . Prostate cancer Father   . Cancer Father        Colon  . Colon cancer Father   . Breast cancer Sister        39's  . Lung cancer Sister   . Breast cancer Maternal Aunt      Allergies: Demeclocycline; Tetracyclines & related; Augmentin [amoxicillin-pot clavulanate]; Bentyl [dicyclomine hcl]; Ciprofloxacin; Codeine; Epinephrine; Flagyl [metronidazole]; Librax [chlordiazepoxide-clidinium]; Phenobarbital; and Ultram [tramadol] Current Outpatient Medications on File Prior to Visit  Medication Sig Dispense Refill  . clonazePAM (KLONOPIN) 0.5 MG tablet Take 0.5 mg by mouth 2 (two) times daily.  0  . colestipol (COLESTID) 1 g tablet TAKE 1 TABLET (1 G TOTAL) BY MOUTH ONCE DAILY MAY INCREASE DOSE TO TWICE DAILY IF NEEDED.  3  . ergocalciferol (VITAMIN D2) 50000 units capsule Take 1 capsule (50,000 Units total) by mouth once a week. 4 capsule 3  . escitalopram (LEXAPRO) 20 MG tablet Take 20 mg by mouth daily.      Marland Kitchen levothyroxine (SYNTHROID, LEVOTHROID) 100 MCG tablet Take 1 tablet (100 mcg total) by mouth daily. 90 tablet 3  . levothyroxine (SYNTHROID, LEVOTHROID) 88 MCG tablet TAKE 1 TABLET BY MOUTH EVERY DAY BEFORE BREAKFAST (NEED APPT) 90 tablet 1  . loperamide (IMODIUM) 1 MG/5ML solution Take 1 mg by mouth as needed for diarrhea or loose stools.    . magnesium oxide (MAG-OX) 400 MG tablet TAKE 1 TABLET BY MOUTH EVERY DAY 90 tablet 1  . ondansetron (ZOFRAN ODT) 4 MG disintegrating tablet Take 1 tablet (4 mg total) by mouth every 8 (eight) hours as needed for nausea or vomiting. 30 tablet 0  . pantoprazole (PROTONIX) 40 MG tablet TAKE 1 TABLET BY MOUTH EVERY DAY 90 tablet 2  . potassium chloride (KLOR-CON 10) 10 MEQ tablet Take 1 tablet (10 mEq total) by mouth daily. 90 tablet 1  . albuterol (PROVENTIL HFA;VENTOLIN HFA) 108 (90 Base) MCG/ACT inhaler Inhale 2 puffs into the lungs every 6 (six) hours as needed for wheezing or shortness of breath. (Patient not taking: Reported on 04/18/2018) 1 Inhaler 0  . Azelastine-Fluticasone (DYMISTA) 137-50 MCG/ACT SUSP One spray in each nostril bid (Patient not taking: Reported on 04/18/2018) 23 g 2   Current Facility-Administered  Medications on File Prior to Visit  Medication Dose Route Frequency Provider Last Rate Last Dose  . ondansetron (ZOFRAN) tablet 8 mg  8 mg Oral Once Guse, Jacquelynn Cree, FNP        Social History   Tobacco Use  . Smoking status: Never Smoker  . Smokeless tobacco: Never Used  Substance Use Topics  . Alcohol use: No    Alcohol/week: 0.0 standard drinks  . Drug use: No    Review of Systems  Constitutional: Negative for chills, fever and unexpected weight change.  HENT: Positive for postnasal drip. Negative for congestion, sinus pressure, sore throat, trouble swallowing and voice change.   Eyes: Negative for visual disturbance.  Respiratory:  Negative for cough, shortness of breath and wheezing.   Cardiovascular: Negative for chest pain and palpitations.  Gastrointestinal: Negative for nausea and vomiting.  Endocrine: Positive for cold intolerance and heat intolerance.  Neurological: Positive for dizziness and headaches.      Objective:    BP 98/60 (BP Location: Left Arm, Patient Position: Sitting, Cuff Size: Normal)   Pulse 97   Temp 97.7 F (36.5 C)   Wt 141 lb 12.8 oz (64.3 kg)   SpO2 95%   BMI 23.96 kg/m    Physical Exam Vitals signs reviewed.  Constitutional:      Appearance: She is well-developed.  HENT:     Head: Normocephalic and atraumatic.     Right Ear: Hearing, tympanic membrane, ear canal and external ear normal. No swelling or tenderness. No middle ear effusion. Tympanic membrane is not erythematous or bulging.     Left Ear: Tympanic membrane, ear canal and external ear normal. No swelling or tenderness.  No middle ear effusion. Tympanic membrane is not erythematous or bulging.     Nose: Nose normal. No rhinorrhea.     Right Sinus: No maxillary sinus tenderness or frontal sinus tenderness.     Left Sinus: No maxillary sinus tenderness or frontal sinus tenderness.     Mouth/Throat:     Mouth: Mucous membranes are moist.     Pharynx: Uvula midline. No posterior  oropharyngeal erythema.      Comments: Irregular shaped whitish color film on tongue as marked on diagram.  Eyes:     General: Lids are normal. Lids are everted, no foreign bodies appreciated.     Conjunctiva/sclera: Conjunctivae normal.     Pupils: Pupils are equal, round, and reactive to light.     Comments: Normal fundus bilaterally   Cardiovascular:     Rate and Rhythm: Normal rate and regular rhythm.     Pulses: Normal pulses.     Heart sounds: Normal heart sounds.  Pulmonary:     Effort: Pulmonary effort is normal.     Breath sounds: Normal breath sounds. No wheezing, rhonchi or rales.  Lymphadenopathy:     Head:     Right side of head: No submental, submandibular, tonsillar, preauricular, posterior auricular or occipital adenopathy.     Left side of head: No submental, submandibular, tonsillar, preauricular, posterior auricular or occipital adenopathy.     Cervical: No cervical adenopathy.     Right cervical: No superficial, deep or posterior cervical adenopathy.    Left cervical: No superficial, deep or posterior cervical adenopathy.  Skin:    General: Skin is warm and dry.  Neurological:     Mental Status: She is alert.     Cranial Nerves: No cranial nerve deficit.     Sensory: No sensory deficit.     Deep Tendon Reflexes:     Reflex Scores:      Bicep reflexes are 2+ on the right side and 2+ on the left side.      Patellar reflexes are 2+ on the right side and 2+ on the left side.    Comments: Grip equal and strong bilateral upper extremities. Gait strong and steady. Able to perform  finger-to-nose without difficulty.   Dix hall pike maneuver did not elicit vertigo. No nystagmus noted.  No headache elicited when laying Patient supine.   Psychiatric:        Speech: Speech normal.        Behavior: Behavior normal.  Thought Content: Thought content normal.        Assessment & Plan:   Problem List Items Addressed This Visit      Respiratory   Pleural  effusion associated with pulmonary infection    Per patient left-sided pneumonia couple months ago, unable to see these reports. No smoking history.  Pleural effusion noted a month ago.  She politely current chest x-ray today, and prefers consult pulmonology.  Referral placed.      Relevant Orders   Ambulatory referral to Pulmonology     Digestive   Thrush    Symptoms and onset after antibiotic therapy consistent with thrush.  Will treat empirically.  Patient will let me know if not better.      Relevant Medications   clotrimazole (MYCELEX) 10 MG troche     Other   Nonintractable headache - Primary    Normal neurologic exam today which is reassuring.  Pending MRI as headache is positional.  Patient does not have history of headaches.  Advise close vigilance, and to let me know if any worsening or new features.      Relevant Orders   MR Brain W Wo Contrast   Other fatigue    Etiology unclear at this point, suspect multifactorial.  Pending lab evaluation.  Close follow-up.  Patient politely declines repeat echocardiogram in 2017.      Relevant Orders   TSH   Comprehensive metabolic panel   VITAMIN D 25 Hydroxy (Vit-D Deficiency, Fractures)   B12 and Folate Panel         I have discontinued Nozomi B. Eskenazi "Tammy"'s predniSONE. I am also having her start on clotrimazole. Additionally, I am having her maintain her escitalopram, loperamide, ergocalciferol, clonazePAM, levothyroxine, levothyroxine, colestipol, Azelastine-Fluticasone, magnesium oxide, ondansetron, potassium chloride, albuterol, and pantoprazole. We will continue to administer ondansetron.   Meds ordered this encounter  Medications  . clotrimazole (MYCELEX) 10 MG troche    Sig: Take 1 tablet (10 mg total) by mouth 3 (three) times daily for 5 days. Dissolve TID    Dispense:  15 tablet    Refill:  0    Order Specific Question:   Supervising Provider    Answer:   Crecencio Mc [[id number]]    Return precautions  given.   Risks, benefits, and alternatives of the medications and treatment plan prescribed today were discussed, and patient expressed understanding.   Education regarding symptom management and diagnosis given to patient on AVS.  Continue to follow with [name], MD for routine health maintenance.   [name] and I agreed with plan.   Mable Paris, FNP

## 2018-04-18 NOTE — Assessment & Plan Note (Signed)
Etiology unclear at this point, suspect multifactorial.  Pending lab evaluation.  Close follow-up.  Patient politely declines repeat echocardiogram in 2017.

## 2018-04-18 NOTE — Patient Instructions (Addendum)
Pending labs, MRI  Today we discussed referrals, orders.  Pulmonology I have placed these orders in the system for you.  Please be sure to give [place] a call if you have not heard from our office regarding this. We should hear from [place] within ONE week with information regarding your appointment. If not, please let me know immediately.    If any new concerns, worsening headache or new features, let me know immediately.   Close follow-up.

## 2018-04-18 NOTE — Assessment & Plan Note (Signed)
Per patient left-sided pneumonia couple months ago, unable to see these reports. No smoking history.  Pleural effusion noted a month ago.  She politely current chest x-ray today, and prefers consult pulmonology.  Referral placed.

## 2018-04-18 NOTE — Assessment & Plan Note (Signed)
Normal neurologic exam today which is reassuring.  Pending MRI as headache is positional.  Patient does not have history of headaches.  Advise close vigilance, and to let me know if any worsening or new features.

## 2018-04-19 ENCOUNTER — Encounter: Payer: Self-pay | Admitting: Family

## 2018-04-19 ENCOUNTER — Other Ambulatory Visit: Payer: Self-pay | Admitting: Family

## 2018-04-19 DIAGNOSIS — E538 Deficiency of other specified B group vitamins: Secondary | ICD-10-CM

## 2018-04-19 MED ORDER — CYANOCOBALAMIN 1000 MCG/ML IJ SOLN: INTRAMUSCULAR | 1 refills | Status: DC

## 2018-04-20 ENCOUNTER — Other Ambulatory Visit: Payer: Self-pay

## 2018-04-20 ENCOUNTER — Other Ambulatory Visit: Payer: Self-pay | Admitting: Family

## 2018-04-20 DIAGNOSIS — E039 Hypothyroidism, unspecified: Secondary | ICD-10-CM

## 2018-04-20 MED ORDER — LEVOTHYROXINE SODIUM 100 MCG PO TABS: 100.0000 ug | ORAL_TABLET | Freq: Every day | ORAL | 3 refills | Status: DC

## 2018-04-21 ENCOUNTER — Other Ambulatory Visit: Payer: Self-pay

## 2018-04-21 ENCOUNTER — Emergency Department
Admission: EM | Admit: 2018-04-21 | Discharge: 2018-04-21 | Disposition: A | Discharge status: Home / Self Care | Payer: PPO | Attending: Emergency Medicine | Admitting: Emergency Medicine

## 2018-04-21 ENCOUNTER — Encounter: Payer: Self-pay | Admitting: Emergency Medicine

## 2018-04-21 ENCOUNTER — Emergency Department: Payer: PPO

## 2018-04-21 ENCOUNTER — Ambulatory Visit: Payer: Self-pay

## 2018-04-21 DIAGNOSIS — R42 Dizziness and giddiness: Secondary | ICD-10-CM | POA: Diagnosis not present

## 2018-04-21 DIAGNOSIS — Z79899 Other long term (current) drug therapy: Secondary | ICD-10-CM | POA: Insufficient documentation

## 2018-04-21 DIAGNOSIS — J90 Pleural effusion, not elsewhere classified: Secondary | ICD-10-CM

## 2018-04-21 DIAGNOSIS — R079 Chest pain, unspecified: Secondary | ICD-10-CM | POA: Diagnosis not present

## 2018-04-21 DIAGNOSIS — R0789 Other chest pain: Secondary | ICD-10-CM | POA: Insufficient documentation

## 2018-04-21 DIAGNOSIS — E039 Hypothyroidism, unspecified: Secondary | ICD-10-CM | POA: Insufficient documentation

## 2018-04-21 LAB — BASIC METABOLIC PANEL
Anion gap: 7 (ref 5–15)
BUN: 11 mg/dL (ref 8–23)
CO2: 28 mmol/L (ref 22–32)
Calcium: 8.6 mg/dL — ABNORMAL LOW (ref 8.9–10.3)
Chloride: 106 mmol/L (ref 98–111)
Creatinine, Ser: 0.97 mg/dL (ref 0.44–1.00)
GFR calc Af Amer: >60 mL/min (ref >60)
GFR calc non Af Amer: >60 mL/min (ref >60)
Glucose, Bld: 149 mg/dL — ABNORMAL HIGH (ref 70–99)
Potassium: 3.3 mmol/L — ABNORMAL LOW (ref 3.5–5.1)
Sodium: 141 mmol/L (ref 135–145)

## 2018-04-21 LAB — CBC
HCT: 41 % (ref 36.0–46.0)
Hemoglobin: 13.2 g/dL (ref 12.0–15.0)
MCH: 29.3 pg (ref 26.0–34.0)
MCHC: 32.2 g/dL (ref 30.0–36.0)
MCV: 91.1 fL (ref 80.0–100.0)
Platelets: 159 10*3/uL (ref 150–400)
RBC: 4.5 MIL/uL (ref 3.87–5.11)
RDW: 15.4 % (ref 11.5–15.5)
WBC: 4.3 10*3/uL (ref 4.0–10.5)
nRBC: 0 % (ref 0.0–0.2)

## 2018-04-21 LAB — TROPONIN I: Troponin I: <0.03 ng/mL (ref <0.03)

## 2018-04-21 MED ORDER — IOPAMIDOL (ISOVUE-370) INJECTION 76%
75.0000 mL | Freq: Once | INTRAVENOUS | Status: AC | PRN
  Administered 2018-04-21: 75 mL via INTRAVENOUS

## 2018-04-21 MED ORDER — IBUPROFEN 600 MG PO TABS: 600.0000 mg | ORAL_TABLET | Freq: Four times a day (QID) | ORAL | 0 refills | Status: DC | PRN

## 2018-04-21 NOTE — ED Triage Notes (Signed)
Pt to ED from home c/o left chest pain that started last night while sitting at home, also dizziness with pain radiating to back and left arm.  Pt denies SOB, speaking in complete and coherent sentences, chest rise even and unlabored, skin warm and dry, in NAD at this time.

## 2018-04-21 NOTE — Telephone Encounter (Signed)
With her history and given persistent worsening chest pain, agree with evaluation today.

## 2018-04-21 NOTE — Telephone Encounter (Signed)
Returned call to pt.  Reported pain in left chest from below the collar bone to the bottom of the left breast.  Stated the pain eases when she remains still, but starts to "ache" with movement.  Reported that in taking a deep breath, she can feel the pain, and when she expires air the pain eases.  Denied any shortness of breath, nausea, or breaking out in a sweat.  Denied fever.  Reported prolonged episode of pain yesterday, from afternoon to approx. 3:00 AM, with constant pain left chest; rated at "8/10".  Pt. verbalized multiple complaints; c/o loss of sense of taste, c/o increased sensitivity to hot or cold, c/o sinus pain, and nasal congestion.  Stated "I just don't feel good."  Stated the chest pain started after she had bronchitis.  Advised with the constant aching of left chest, and pain with deep breath, she should go to the ER.  Stated she did not want to go to the ER.    Phone call to [facility]; spoke with Judson Roch.  Was advised to send note to Dr. Nicki Reaper, and further recommendations will be given.    Advised pt. Of the plan for Dr. Nicki Reaper to make further recommendations.  The pt. Stated she will contact a friend, to see if she will go with her to the ER; stated she would feel better if someone will go with her.             Reason for Disposition . Taking a deep breath makes pain worse    Taking a deep breath causes the pain; when she expires the air, the pain subsides. Denied pain sitting still.  C/o aching in chest and this increases with activity.  Answer Assessment - Initial Assessment Questions 1. LOCATION: "Where does it hurt?"       Discomfort left ant.chest, below the collar and traveled to the lower left breast  2. RADIATION: "Does the pain go anywhere else?" (e.g., into neck, jaw, arms, back)     Denied shooting pain  3. ONSET: "When did the chest pain begin?" (Minutes, hours or days)      It started when she 1st got over bronchitis  4. PATTERN "Does the pain come and go, or has it been  constant since it started?"  "Does it get worse with exertion?"      Achy with movement 5. DURATION: "How long does it last" (e.g., seconds, minutes, hours)     varies 6. SEVERITY: "How bad is the pain?"  (e.g., Scale 1-10; mild, moderate, or severe)    - MILD (1-3): doesn't interfere with normal activities     - MODERATE (4-7): interferes with normal activities or awakens from sleep    - SEVERE (8-10): excruciating pain, unable to do any normal activities       No pain if staying still; some pain with movement; intermittent pain increases to 8/10 7. CARDIAC RISK FACTORS: "Do you have any history of heart problems or risk factors for heart disease?" (e.g., prior heart attack, angina; high blood pressure, diabetes, being overweight, high cholesterol, smoking, or strong family history of heart disease)     Denied heart hx.; denied the above   8. PULMONARY RISK FACTORS: "Do you have any history of lung disease?"  (e.g., blood clots in lung, asthma, emphysema, birth control pills)    Denied the above; hx of colon, thyroid and right breast CA 9. CAUSE: "What do you think is causing the chest pain?"  Unsure; this started after having bronchitis 10. OTHER SYMPTOMS: "Do you have any other symptoms?" (e.g., dizziness, nausea, vomiting, sweating, fever, difficulty breathing, cough)       Denied shortness of breath, nausea, or sweating; c/o loss of sense of taste; very sensitive to hot and cold; denied dizziness, denied fever/ chills. "I just don't feel good" c/o sinus pain; nasal congestion with clear drainage.  11. PREGNANCY: "Is there any chance you are pregnant?" "When was your last menstrual period?"       N/a  Protocols used: CHEST PAIN-A-AH Message from [facility] sent at 04/21/2018 11:56 AM EST   Summary: Advice   Patient was seen by Mable Paris 2 days ago since the visit she has had left shoulder pain a night ago it kept her awake all night and she had to take Aleve, she is asking  advice for the pain and what she should do. She did not see the Dr. For this issue it was for bronchitis, etc. She did have the shoulder pain after getting over the bronchitis.

## 2018-04-21 NOTE — ED Provider Notes (Signed)
[facility] Emergency Department Provider Note ____________________________________________   First MD Initiated Contact with Patient 04/21/18 1830     (approximate)  I have reviewed the triage vital signs and the nursing notes.   HISTORY  Chief Complaint Chest Pain    HPI Debra Hale is a 67 y.o. female with PMH as noted below including recent pneumonia treated as an outpatient who presents with a left sided and substernal chest pain over the last day, described as sharp, and worse with palpation as well as with certain positions.  She reports mild lightheadedness but denies shortness of breath, cough, fever, nausea or vomiting.  He states she called her primary care doctor and was instructed to come to the hospital to rule out a blood clot.  Past Medical History:  Diagnosis Date  . Anxiety   . Arthritis    Osteoarthritis  . Breast cancer ([place]) 2009   right breast lumpectomy with rad tx  . Colon cancer ([place])    surgery with chemo and rad tx  . GERD (gastroesophageal reflux disease)   . Hypothyroidism   . Liver disease   . Liver nodule    s/p negative biopsy  . Malignant neoplasm of thyroid gland ([place]) 2002   s/p surgery and XRT  . Nephrolithiasis    s/p lithotripsy  . Osteoporosis   . Other and unspecified hyperlipidemia   . Palpitations   . Personal history of chemotherapy   . Personal history of malignant neoplasm of large intestine    carcinoma - cecum, s/p right laparoscopic colectomy - s/p chemotherapy and XRT  . Personal history of radiation therapy   . Pure hypercholesterolemia   . Unspecified hereditary and idiopathic peripheral neuropathy     Patient Active Problem List   Diagnosis Date Noted  . Nonintractable headache 04/18/2018  . Thrush 04/18/2018  . Pleural effusion associated with pulmonary infection 04/18/2018  . Other fatigue 04/18/2018  . Abdominal pain 03/06/2016  . Acute diarrhea 01/10/2016  . Neck nodule  10/13/2015  . Vitamin D deficiency 07/27/2015  . Loose stools 04/14/2015  . Mild depression ([place]) 02/02/2015  . Health care maintenance 06/17/2014  . Stress 01/28/2014  . Acute pericarditis 05/31/2013  . Environmental allergies 02/25/2013  . Left elbow pain 02/25/2013  . Abnormal liver function test 12/07/2012  . Anxiety 05/06/2012  . GERD (gastroesophageal reflux disease) 05/06/2012  . Osteoporosis 05/06/2012  . History of breast cancer 09/18/2008  . 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 Hypercholesterolemia 09/18/2008  . Peripheral neuropathy 09/18/2008  . Palpitations 09/18/2008  . History of malignant neoplasm of large intestine 09/18/2008    Past Surgical History:  Procedure Laterality Date  . APPENDECTOMY  1985  . BREAST BIOPSY Right 2009   positive  . BREAST BIOPSY Right 2009   negative  . BREAST LUMPECTOMY Right 2009   breast cancer  . CHOLECYSTECTOMY  1995  . COLONOSCOPY    . COLONOSCOPY WITH PROPOFOL N/A 10/29/2017   Procedure: COLONOSCOPY WITH PROPOFOL;  Surgeon: Manya Silvas, MD;  Location[facility] ENDOSCOPY;  Service: Endoscopy;  Laterality: N/A;  . DILATION AND CURETTAGE OF UTERUS  1990  . DILATION AND CURETTAGE, DIAGNOSTIC / THERAPEUTIC  1990  . ESOPHAGOGASTRODUODENOSCOPY    . ESOPHAGOGASTRODUODENOSCOPY (EGD) WITH PROPOFOL N/A 10/29/2017   Procedure: ESOPHAGOGASTRODUODENOSCOPY (EGD) WITH PROPOFOL;  Surgeon: Manya Silvas, MD;  Location: [facility] ENDOSCOPY;  Service: Endoscopy;  Laterality: N/A;  . LAPAROSCOPIC PARTIAL COLECTOMY     stage 3-C carcinoma  of the cecum, s/p chemotherapy and xrt  . LITHOTRIPSY    . SIGMOIDOSCOPY  08/26/1993  . THYROID LOBECTOMY  2002   s/p XRT    Prior to Admission medications   Medication Sig Start Date End Date Taking? Authorizing Provider  albuterol (PROVENTIL HFA;VENTOLIN HFA) 108 (90 Base) MCG/ACT inhaler Inhale 2 puffs into the lungs every 6 (six) hours as needed for wheezing or shortness of breath. Patient  not taking: Reported on 04/18/2018 03/22/18   Jodelle Green, FNP  Azelastine-Fluticasone [facility]) [phone] MCG/ACT SUSP One spray in each nostril bid Patient not taking: Reported on 04/18/2018 01/06/18   [name], MD  clonazePAM (KLONOPIN) 0.5 MG tablet Take 0.5 mg by mouth 2 (two) times daily. 09/06/17   [provider]  clotrimazole (MYCELEX) 10 MG troche Take 1 tablet (10 mg total) by mouth 3 (three) times daily for 5 days. Dissolve TID 04/18/18 04/23/18  Burnard Hawthorne, FNP  colestipol (COLESTID) 1 g tablet TAKE 1 TABLET (1 G TOTAL) BY MOUTH ONCE DAILY MAY INCREASE DOSE TO TWICE DAILY IF NEEDED. 01/01/18   [provider]  cyanocobalamin (,VITAMIN B-12,) 1000 MCG/ML injection 1000 mcg (1 mg) injection once per week for four weeks, followed by 1000 mcg injection once per month. 04/19/18   Burnard Hawthorne, FNP  ergocalciferol (VITAMIN D2) 50000 units capsule Take 1 capsule (50,000 Units total) by mouth once a week. 07/08/16   [name], MD  escitalopram (LEXAPRO) 20 MG tablet Take 20 mg by mouth daily.      [provider]  ibuprofen (ADVIL,MOTRIN) 600 MG tablet Take 1 tablet (600 mg total) by mouth every 6 (six) hours as needed. 04/21/18   Arta Silence, MD  levothyroxine (SYNTHROID, LEVOTHROID) 100 MCG tablet Take 1 tablet (100 mcg total) by mouth daily. 04/20/18   [name], MD  loperamide (IMODIUM) 1 MG/5ML solution Take 1 mg by mouth as needed for diarrhea or loose stools.    [provider]  magnesium oxide (MAG-OX) 400 MG tablet TAKE 1 TABLET BY MOUTH EVERY DAY 01/12/18   [name], MD  ondansetron (ZOFRAN ODT) 4 MG disintegrating tablet Take 1 tablet (4 mg total) by mouth every 8 (eight) hours as needed for nausea or vomiting. 03/15/18   Jodelle Green, FNP  pantoprazole (PROTONIX) 40 MG tablet TAKE 1 TABLET BY MOUTH EVERY DAY 04/08/18   [name], MD  potassium chloride (KLOR-CON 10) 10 MEQ tablet Take 1 tablet (10 mEq  total) by mouth daily. 03/16/18   Jodelle Green, FNP    Allergies Demeclocycline; Tetracyclines & related; Augmentin [amoxicillin-pot clavulanate]; Bentyl [dicyclomine hcl]; Ciprofloxacin; Codeine; Epinephrine; Flagyl [metronidazole]; Librax [chlordiazepoxide-clidinium]; Phenobarbital; and Ultram [tramadol]  Family History  Problem Relation Age of Onset  . Stroke Mother        23s  . Alzheimer's disease Mother   . Cancer Mother   . Lung cancer Father   . Prostate cancer Father   . Cancer Father        Colon  . Colon cancer Father   . Breast cancer Sister        67's  . Lung cancer Sister   . Breast cancer Maternal Aunt     Social History Social History   Tobacco Use  . Smoking status: Never Smoker  . Smokeless tobacco: Never Used  Substance Use Topics  . Alcohol use: No    Alcohol/week: 0.0 standard drinks  . Drug use: No    Review of  Systems  Constitutional: No fever. Eyes: No visual changes. ENT: No sore throat. Cardiovascular: Positive for chest pain. Respiratory: Denies shortness of breath. Gastrointestinal: No vomiting or diarrhea.  Genitourinary: Negative for flank pain.  Musculoskeletal: Negative for back pain. Skin: Negative for rash. Neurological: Negative for headache.   ____________________________________________   PHYSICAL EXAM:  VITAL SIGNS: ED Triage Vitals  Enc Vitals Group     BP 04/21/18 1450 (!) 142/80     Pulse Rate 04/21/18 1450 (!) 106     Resp 04/21/18 1450 16     Temp 04/21/18 1450 97.8 F (36.6 C)     Temp Source 04/21/18 1450 Oral     SpO2 04/21/18 1450 98 %     Weight 04/21/18 1447 141 lb (64 kg)     Height 04/21/18 1447 5\' 4"  (1.626 m)     Head Circumference --      Peak Flow --      Pain Score 04/21/18 1446 3     Pain Loc --      Pain Edu? --      Excl. in [place]? --     Constitutional: Alert and oriented. Well appearing and in no acute distress. Eyes: Conjunctivae are normal.  Head: Atraumatic. Nose: No  congestion/rhinnorhea. Mouth/Throat: Mucous membranes are moist.   Neck: Normal range of motion.  Cardiovascular: Normal rate, regular rhythm. Grossly normal heart sounds.  Good peripheral circulation. Respiratory: Normal respiratory effort.  No retractions. Lungs CTAB. Gastrointestinal: No distention.  Musculoskeletal: Extremities warm and well perfused.  Neurologic:  Normal speech and language. No gross focal neurologic deficits are appreciated.  Skin:  Skin is warm and dry. No rash noted. Psychiatric: Mood and affect are normal. Speech and behavior are normal.  ____________________________________________   LABS (all labs ordered are listed, but only abnormal results are displayed)  Labs Reviewed  BASIC METABOLIC PANEL - Abnormal; Notable for the following components:      Result Value   Potassium 3.3 (*)    Glucose, Bld 149 (*)    Calcium 8.6 (*)    All other components within normal limits  CBC  TROPONIN I   ____________________________________________  EKG  ED ECG REPORT I, Arta Silence, the attending physician, personally viewed and interpreted this ECG.  Date: 04/21/2018 EKG Time: 1447 Rate: 103 Rhythm: Sinus tachycardia QRS Axis: normal Intervals: normal ST/T Wave abnormalities: Nonspecific T wave flattening Narrative Interpretation: no evidence of acute ischemia; no significant change when compared to EKG of 03/24/2016  ____________________________________________  RADIOLOGY  CXR: Small left pleural effusion CT chest: No PE.  Moderate left pleural effusion with atelectasis.  Small pericardial effusion.  ____________________________________________   PROCEDURES  Procedure(s) performed: No  Procedures  Critical Care performed: No ____________________________________________   INITIAL IMPRESSION / ASSESSMENT AND PLAN / ED COURSE  Pertinent labs & imaging results that were available during my care of the patient were reviewed by me and  considered in my medical decision making (see chart for details).  67 year old female with PMH as noted above and recently treated for pneumonia presents with substernal and left-sided chest pain over the last day, occurring last night and persistent since then.  It is not associated with cough, significant shortness of breath, lightheadedness, or other acute symptoms.  She states it is somewhat positional.  She states she has had some chest pain on and off for the last several weeks as well.  She reported that she talk to her primary care doctor who instructed her to  come to the hospital to rule out a blood clot.  On exam the patient is well-appearing.  She has normal vital signs and an unremarkable exam with clear lungs and normal heart sounds.  The EKG shows some T wave flattening which is identical to prior EKG from 2017.  Initial lab work-up including troponin was unremarkable.  Chest x-ray showed a small left pleural effusion which I suspect is most likely present after her recent pneumonia but no other acute findings.  Given that the pain has been relatively constant since last night, there is no indication for repeat troponin.  With the unremarkable EKG and the patient's well appearance and highly atypical nature of the pain, there is no clinical evidence for ACS.  I also have a low suspicion for PE, however given that the patient states she was sent in to rule out PE and was a bit tachycardic when she first arrived, I think it would be reasonable to obtain a CT chest.  This would also further evaluate the effusion.  Clinical Course as of Apr 21 2022  Thu Apr 21, 2018  1829 CO2: 28 [SS]    Clinical Course User Index [SS] Arta Silence, MD   ----------------------------------------- 8:22 PM on 04/21/2018 -----------------------------------------  CT chest shows no acute PE but does confirm left-sided pleural effusion.  The patient also has a small pericardial effusion.  She reports  a prior history of pericarditis.  At this time the patient continues to feel comfortable and is adamant that she would like to go home.  Given that she has normal vital signs, reassuring labs, and no other acute findings on the CT I think that it is reasonable to discharge her home and have her follow-up as an outpatient to further evaluate these findings.  I thoroughly discussed the results of the imaging with her including the pleural and pericardial effusions and noted them on her discharge paperwork.  I gave her return precautions and she expressed understanding.  ____________________________________________   FINAL CLINICAL IMPRESSION(S) / ED DIAGNOSES  Final diagnoses:  Pleural effusion  Atypical chest pain      NEW MEDICATIONS STARTED DURING THIS VISIT:  Discharge Medication List as of 04/21/2018  8:09 PM    START taking these medications   Details  ibuprofen (ADVIL,MOTRIN) 600 MG tablet Take 1 tablet (600 mg total) by mouth every 6 (six) hours as needed., Starting Thu 04/21/2018, Normal         Note:  This document was prepared using Dragon voice recognition software and may include unintentional dictation errors.   Arta Silence, MD 04/21/18 2023

## 2018-04-21 NOTE — Discharge Instructions (Addendum)
Your x-ray and CT showed no signs of a blood clot, however you do have a pleural effusion which is fluid below the lung.  This is likely occurring after the inflammation you had during your pneumonia.  Make an appointment to follow-up with your primary care doctor.  You should have an x-ray in a few weeks to make sure that the effusion is resolving.  Return to the ER for new, worsening, persistent severe pain, difficulty breathing, fevers, weakness, or any other new or worsening symptoms that concern you.

## 2018-04-21 NOTE — ED Notes (Signed)
Patient transported to CT 

## 2018-04-21 NOTE — Telephone Encounter (Signed)
Per chart review, patient is at the ED now

## 2018-04-21 NOTE — Telephone Encounter (Signed)
Please advise 

## 2018-04-28 ENCOUNTER — Ambulatory Visit (INDEPENDENT_AMBULATORY_CARE_PROVIDER_SITE_OTHER): Payer: PPO

## 2018-04-28 DIAGNOSIS — E538 Deficiency of other specified B group vitamins: Secondary | ICD-10-CM

## 2018-04-28 MED ORDER — CYANOCOBALAMIN 1000 MCG/ML IJ SOLN
1000.0000 ug | Freq: Once | INTRAMUSCULAR | Status: AC
  Administered 2018-04-28: 1000 ug via INTRAMUSCULAR

## 2018-04-28 NOTE — Progress Notes (Signed)
I have reviewed the above note and agree.   , M.D.  

## 2018-04-28 NOTE — Progress Notes (Signed)
Patient presents today for b12 injection. Left deltoid. Pt voiced no concerns nor showed any signs of distress during injection.

## 2018-04-28 NOTE — Progress Notes (Signed)
It also appears that she was seen in the ED recently. She will need to follow-up with Dr Nicki Reaper for this. Please check with Dr Nicki Reaper so she can determine when the patient needs to be seen for ED follow-up.

## 2018-04-29 NOTE — Progress Notes (Signed)
Left message to return call to office.

## 2018-04-29 NOTE — Progress Notes (Signed)
Spoke with pt and she stated that she is okay with going forward with both the cardiology and the pulmonology referrals.

## 2018-04-29 NOTE — Progress Notes (Signed)
Reviewed Dr Ellen Henri note and ER note.  Given findings on CT performed in ER (pleural effusion - pocket of fluid in lung base and pericardial effusion (pocket of fluid around the heart),  I would like for her to f/u with cardiology and I would like to schedule her an appt with pulmonary for further evaluation.  She has seen cardiology previously for pericarditis.  If agreeable, let me know and I will place the order.

## 2018-05-01 ENCOUNTER — Other Ambulatory Visit: Payer: Self-pay | Admitting: Internal Medicine

## 2018-05-01 DIAGNOSIS — I313 Pericardial effusion (noninflammatory): Secondary | ICD-10-CM

## 2018-05-01 DIAGNOSIS — J90 Pleural effusion, not elsewhere classified: Secondary | ICD-10-CM

## 2018-05-01 DIAGNOSIS — I3139 Other pericardial effusion (noninflammatory): Secondary | ICD-10-CM

## 2018-05-01 NOTE — Progress Notes (Signed)
Order placed for pulmonary and cardiology referral.

## 2018-05-01 NOTE — Progress Notes (Signed)
Order placed for pulmonary referral and cardiology referral.

## 2018-05-02 ENCOUNTER — Encounter: Payer: Self-pay | Admitting: Family

## 2018-05-02 ENCOUNTER — Telehealth: Payer: Self-pay | Admitting: Internal Medicine

## 2018-05-02 ENCOUNTER — Ambulatory Visit (INDEPENDENT_AMBULATORY_CARE_PROVIDER_SITE_OTHER): Payer: PPO | Admitting: Family

## 2018-05-02 VITALS — BP 122/80 | HR 102 | Temp 98.4°F | Ht 64.0 in | Wt 141.8 lb

## 2018-05-02 DIAGNOSIS — J918 Pleural effusion in other conditions classified elsewhere: Secondary | ICD-10-CM

## 2018-05-02 DIAGNOSIS — M542 Cervicalgia: Secondary | ICD-10-CM | POA: Diagnosis not present

## 2018-05-02 DIAGNOSIS — R519 Headache, unspecified: Secondary | ICD-10-CM

## 2018-05-02 DIAGNOSIS — R51 Headache: Secondary | ICD-10-CM | POA: Diagnosis not present

## 2018-05-02 DIAGNOSIS — E039 Hypothyroidism, unspecified: Secondary | ICD-10-CM | POA: Insufficient documentation

## 2018-05-02 DIAGNOSIS — J189 Pneumonia, unspecified organism: Secondary | ICD-10-CM

## 2018-05-02 LAB — BASIC METABOLIC PANEL
BUN: 13 mg/dL (ref 6–23)
CO2: 28 meq/L (ref 19–32)
Calcium: 9.2 mg/dL (ref 8.4–10.5)
Chloride: 102 mEq/L (ref 96–112)
Creatinine, Ser: 1.14 mg/dL (ref 0.40–1.20)
GFR: 50.31 mL/min — ABNORMAL LOW (ref >60.00)
Glucose, Bld: 101 mg/dL — ABNORMAL HIGH (ref 70–99)
Potassium: 4 mEq/L (ref 3.5–5.1)
Sodium: 139 mEq/L (ref 135–145)

## 2018-05-02 NOTE — Assessment & Plan Note (Signed)
Unchanged.  Discussed the patient etiology of her headaches unclear at this time.  Pending MRI of the brain.  Also discussed patient at length that I suspect congestion contributory.  I feels the patient has had multiple antibiotics this Fall, and we discussed the risk of another antibiotic in regards to severe diarrheal infections, resistance.  We feel this time it is reasonable to try Mucinex for the next couple of days with plenty water to see if improvement of congestion improves headache.  Patient will let me know.  Advised close vigilance.

## 2018-05-02 NOTE — Patient Instructions (Addendum)
As discussed at length, suspect her headaches are in part worsened by congestion.  Please start plain Mucinex.  Plenty of water.  may also restart Flonase as well as if you think this is helpful. Tylenol arthritis or aleve for neck pain  Lets stay in close touch  Also,  it is important that she have a consult cardiology for heart effusion, pulmonology for lung effusion.  Please give the office a call in the next week if these appointments are not scheduled.    Please ensure that you also have the MRI of the brain as scheduled.   General Headache Without Cause A headache is pain or discomfort felt around the head or neck area. The specific cause of a headache may not be found. There are many causes and types of headaches. A few common ones are:  Tension headaches.  Migraine headaches.  Cluster headaches.  Chronic daily headaches. Follow these instructions at home: Watch your condition for any changes. Let your health care provider know about them. Take these steps to help with your condition: Managing pain      Take over-the-counter and prescription medicines only as told by your health care provider.  Lie down in a dark, quiet room when you have a headache.  If directed, put ice on your head and neck area: ? Put ice in a plastic bag. ? Place a towel between your skin and the bag. ? Leave the ice on for 20 minutes, 2-3 times per day.  If directed, apply heat to the affected area. Use the heat source that your health care provider recommends, such as a moist heat pack or a heating pad. ? Place a towel between your skin and the heat source. ? Leave the heat on for 20-30 minutes. ? Remove the heat if your skin turns bright red. This is especially important if you are unable to feel pain, heat, or cold. You may have a greater risk of getting burned.  Keep lights dim if bright lights bother you or make your headaches worse. Eating and drinking  Eat meals on a regular  schedule.  If you drink alcohol: ? Limit how much you use to:  0-1 drink a day for women.  0-2 drinks a day for men. ? Be aware of how much alcohol is in your drink. In the [place] one drink equals one 12 oz bottle of beer (355 mL), one 5 oz glass of wine (148 mL), or one 1 oz glass of hard liquor (44 mL).  Stop drinking caffeine, or decrease the amount of caffeine you drink. General instructions   Keep a headache journal to help find out what may trigger your headaches. For example, write down: ? What you eat and drink. ? How much sleep you get. ? Any change to your diet or medicines.  Try massage or other relaxation techniques.  Limit stress.  Sit up straight, and do not tense your muscles.  Do not use any products that contain nicotine or tobacco, such as cigarettes, e-cigarettes, and chewing tobacco. If you need help quitting, ask your health care provider.  Exercise regularly as told by your health care provider.  Sleep on a regular schedule. Get 7-9 hours of sleep each night, or the amount recommended by your health care provider.  Keep all follow-up visits as told by your health care provider. This is important. Contact a health care provider if:  Your symptoms are not helped by medicine.  You have a headache that is  different from the usual headache.  You have nausea or you vomit.  You have a fever. Get help right away if:  Your headache becomes severe quickly.  Your headache gets worse after moderate to intense physical activity.  You have repeated vomiting.  You have a stiff neck.  You have a loss of vision.  You have problems with speech.  You have pain in the eye or ear.  You have muscular weakness or loss of muscle control.  You lose your balance or have trouble walking.  You feel faint or pass out.  You have confusion.  You have a seizure. Summary  A headache is pain or discomfort felt around the head or neck area.  There are many  causes and types of headaches. In some cases, the cause may not be found.  Keep a headache journal to help find out what may trigger your headaches. Watch your condition for any changes. Let your health care provider know about them.  Contact a health care provider if you have a headache that is different from the usual headache, or if your symptoms are not helped by medicine.  Get help right away if your headache becomes severe, you vomit, you have a loss of vision, you lose your balance, or you have a seizure. This information is not intended to replace advice given to you by your health care provider. Make sure you discuss any questions you have with your health care provider. Document Released: 04/20/2005 Document Revised: 11/08/2017 Document Reviewed: 11/08/2017 Elsevier Interactive Patient Education  2019 [employer].

## 2018-05-02 NOTE — Assessment & Plan Note (Signed)
Patient well-appearing, SaO2 96.  reviewed emergency room visit with patient.  Moderate left pleural effusion.  Pending pulmonology consult.  Of note, small cardiac effusion also noted.  Patient politely declines echocardiogram today.  She will await consult with cardiology.   Advised patient to give [place] a call if she is not heard with regards her appointments in the next week.

## 2018-05-02 NOTE — Telephone Encounter (Signed)
Patient says she has had head  Pain , has thrush freezing all the time , feels like her glands are swollen in her throat . Patient also says she is having a hard time keeping anything down. Feels cold all the time has shivers does not know if she has fever. Scheduled sameday with arnett.

## 2018-05-02 NOTE — Assessment & Plan Note (Signed)
No acute infection seen on recent chest x-ray.  Discussed with patient I suspect cold intolerance is likely to be from thyroid not being therapeutic at this time.  She is on increased dose of Synthroid over the past 2 weeks, expressed may take up to 6 to 8 weeks to become euthyroid.  We jointly agreed to continue to monitor this symptom.  Again close vigilance is advised.

## 2018-05-02 NOTE — Progress Notes (Signed)
Subjective:    Patient ID: Debra Hale, female    DOB: 10-08-1950, 67 y.o.   MRN: [id number]  CC: Debra Hale is a 67 y.o. female who presents today for an acute visit.    HPI: Here today primarily for HA and bodyaches for weeks. Unsure how long at this point.  She describes "body aches", she describes more of a cold intolerance.  She feels like she may be having chills.  No fever.  Headaches- unchanged. Frontal HA. congestion which thinks is contributing. Not worse. No resolve with advil.  No vision changes.  MRI brain scheduled 05/12/2018  Congestion  alternating thin and thick. Not on flonase right now. When used, Flonase helps 'open up passages' however then feels like air in nose is cold.   She also complains the front of her neck is aching.  Particularly when she leans forward.  No sore throat.  No chest pain.   Pulmonology consult- not scheduled yet.   ED- pleural effusion 04/21/18. CXR small left effusion. Normal troponin x 1 CT angio-  Calcifications. No PE.  Moderate left pleural effusion.   Pcp f/u 05/19/2018  Has seen Dr Tami Ribas one month ago. Per patient had CT of head. No sinus infection per patient. Unable to see records.   Has tried doxycycline, clindamycin, prednisone, flonase, levaquin, amoxicillin in the past couple of months, with no resolve.   Hypothyroidism- increased snthryoid to 165mcg about 2 weeks ago. Still having cold intolerance.  Vitamin d deficiency- started a vitamin D.   CT soft tissue head and neck, negative for mass or adenopathy in the neck. X-ray of left and right shoulder 2019-mild degeneration Chest x-ray November 12 showed right lung nodule, this was resolved on subsequent image 3 days later.  HISTORY:  Past Medical History:  Diagnosis Date  . Anxiety   . Arthritis    Osteoarthritis  . Breast cancer ([place]) 2009   right breast lumpectomy with rad tx  . Colon cancer ([place])    surgery with chemo and rad tx  . GERD (gastroesophageal  reflux disease)   . Hypothyroidism   . Liver disease   . Liver nodule    s/p negative biopsy  . Malignant neoplasm of thyroid gland ([place]) 2002   s/p surgery and XRT  . Nephrolithiasis    s/p lithotripsy  . Osteoporosis   . Other and unspecified hyperlipidemia   . Palpitations   . Personal history of chemotherapy   . Personal history of malignant neoplasm of large intestine    carcinoma - cecum, s/p right laparoscopic colectomy - s/p chemotherapy and XRT  . Personal history of radiation therapy   . Pure hypercholesterolemia   . Unspecified hereditary and idiopathic peripheral neuropathy    Past Surgical History:  Procedure Laterality Date  . APPENDECTOMY  1985  . BREAST BIOPSY Right 2009   positive  . BREAST BIOPSY Right 2009   negative  . BREAST LUMPECTOMY Right 2009   breast cancer  . CHOLECYSTECTOMY  1995  . COLONOSCOPY    . COLONOSCOPY WITH PROPOFOL N/A 10/29/2017   Procedure: COLONOSCOPY WITH PROPOFOL;  Surgeon: Manya Silvas, MD;  Location: [facility] ENDOSCOPY;  Service: Endoscopy;  Laterality: N/A;  . DILATION AND CURETTAGE OF UTERUS  1990  . DILATION AND CURETTAGE, DIAGNOSTIC / THERAPEUTIC  1990  . ESOPHAGOGASTRODUODENOSCOPY    . ESOPHAGOGASTRODUODENOSCOPY (EGD) WITH PROPOFOL N/A 10/29/2017   Procedure: ESOPHAGOGASTRODUODENOSCOPY (EGD) WITH PROPOFOL;  Surgeon: Manya Silvas, MD;  Location: [facility]  ENDOSCOPY;  Service: Endoscopy;  Laterality: N/A;  . LAPAROSCOPIC PARTIAL COLECTOMY     stage 3-C carcinoma of the cecum, s/p chemotherapy and xrt  . LITHOTRIPSY    . SIGMOIDOSCOPY  08/26/1993  . THYROID LOBECTOMY  2002   s/p XRT   Family History  Problem Relation Age of Onset  . Stroke Mother        35s  . Alzheimer's disease Mother   . Cancer Mother   . Lung cancer Father   . Prostate cancer Father   . Cancer Father        Colon  . Colon cancer Father   . Breast cancer Sister        67's  . Lung cancer Sister   . Breast cancer Maternal Aunt     Allergies:  Demeclocycline; Tetracyclines & related; Augmentin [amoxicillin-pot clavulanate]; Bentyl [dicyclomine hcl]; Ciprofloxacin; Codeine; Epinephrine; Flagyl [metronidazole]; Librax [chlordiazepoxide-clidinium]; Phenobarbital; and Ultram [tramadol] Current Outpatient Medications on File Prior to Visit  Medication Sig Dispense Refill  . albuterol (PROVENTIL HFA;VENTOLIN HFA) 108 (90 Base) MCG/ACT inhaler Inhale 2 puffs into the lungs every 6 (six) hours as needed for wheezing or shortness of breath. 1 Inhaler 0  . Azelastine-Fluticasone (DYMISTA) 137-50 MCG/ACT SUSP One spray in each nostril bid 23 g 2  . clonazePAM (KLONOPIN) 0.5 MG tablet Take 0.5 mg by mouth 2 (two) times daily.  0  . colestipol (COLESTID) 1 g tablet TAKE 1 TABLET (1 G TOTAL) BY MOUTH ONCE DAILY MAY INCREASE DOSE TO TWICE DAILY IF NEEDED.  3  . cyanocobalamin (,VITAMIN B-12,) 1000 MCG/ML injection 1000 mcg (1 mg) injection once per week for four weeks, followed by 1000 mcg injection once per month. 4 mL 1  . ergocalciferol (VITAMIN D2) 50000 units capsule Take 1 capsule (50,000 Units total) by mouth once a week. 4 capsule 3  . escitalopram (LEXAPRO) 20 MG tablet Take 20 mg by mouth daily.      Marland Kitchen ibuprofen (ADVIL,MOTRIN) 600 MG tablet Take 1 tablet (600 mg total) by mouth every 6 (six) hours as needed. 30 tablet 0  . levothyroxine (SYNTHROID, LEVOTHROID) 100 MCG tablet Take 1 tablet (100 mcg total) by mouth daily. 90 tablet 3  . loperamide (IMODIUM) 1 MG/5ML solution Take 1 mg by mouth as needed for diarrhea or loose stools.    . magnesium oxide (MAG-OX) 400 MG tablet TAKE 1 TABLET BY MOUTH EVERY DAY 90 tablet 1  . ondansetron (ZOFRAN ODT) 4 MG disintegrating tablet Take 1 tablet (4 mg total) by mouth every 8 (eight) hours as needed for nausea or vomiting. 30 tablet 0  . pantoprazole (PROTONIX) 40 MG tablet TAKE 1 TABLET BY MOUTH EVERY DAY 90 tablet 2  . potassium chloride (KLOR-CON 10) 10 MEQ tablet Take 1 tablet (10 mEq total) by mouth  daily. 90 tablet 1   Current Facility-Administered Medications on File Prior to Visit  Medication Dose Route Frequency Provider Last Rate Last Dose  . ondansetron (ZOFRAN) tablet 8 mg  8 mg Oral Once Guse, Jacquelynn Cree, FNP        Social History   Tobacco Use  . Smoking status: Never Smoker  . Smokeless tobacco: Never Used  Substance Use Topics  . Alcohol use: No    Alcohol/week: 0.0 standard drinks  . Drug use: No    Review of Systems  Constitutional: Negative for chills and fever.  HENT: Positive for congestion. Negative for sinus pressure, sore throat, trouble swallowing and voice change.  Eyes: Negative for visual disturbance.  Respiratory: Negative for cough.   Cardiovascular: Negative for chest pain and palpitations.  Gastrointestinal: Negative for nausea and vomiting.  Endocrine: Positive for cold intolerance. Negative for heat intolerance.  Musculoskeletal: Positive for neck pain.  Neurological: Positive for headaches. Negative for dizziness.      Objective:    BP 122/80   Pulse (!) 102   Temp 98.4 F (36.9 C) (Oral)   Ht 5\' 4"  (1.626 m)   Wt 141 lb 12.8 oz (64.3 kg)   SpO2 95%   BMI 24.34 kg/m  Wt Readings from Last 3 Encounters:  05/02/18 141 lb 12.8 oz (64.3 kg)  04/21/18 141 lb (64 kg)  04/18/18 141 lb 12.8 oz (64.3 kg)     Physical Exam Vitals signs reviewed.  Constitutional:      Appearance: She is well-developed.  HENT:     Head: Normocephalic and atraumatic.     Right Ear: Hearing, tympanic membrane, ear canal and external ear normal. No decreased hearing noted. No drainage, swelling or tenderness. No middle ear effusion. No foreign body. Tympanic membrane is not erythematous or bulging.     Left Ear: Hearing, tympanic membrane, ear canal and external ear normal. No decreased hearing noted. No drainage, swelling or tenderness.  No middle ear effusion. No foreign body. Tympanic membrane is not erythematous or bulging.     Nose: Nose normal. No  rhinorrhea.     Right Sinus: No maxillary sinus tenderness or frontal sinus tenderness.     Left Sinus: No maxillary sinus tenderness or frontal sinus tenderness.     Mouth/Throat:     Pharynx: Uvula midline. No oropharyngeal exudate or posterior oropharyngeal erythema.     Tonsils: No tonsillar abscesses.  Eyes:     Conjunctiva/sclera: Conjunctivae normal.  Neck:     Musculoskeletal: Full passive range of motion without pain.      Comments: Area of pain is marked on diagram.  No masses or nodules appreciated. Cardiovascular:     Rate and Rhythm: Regular rhythm.     Pulses: Normal pulses.     Heart sounds: Normal heart sounds.  Pulmonary:     Effort: Pulmonary effort is normal.     Breath sounds: Normal breath sounds. No wheezing, rhonchi or rales.  Musculoskeletal:     Cervical back: She exhibits normal range of motion, no tenderness, no bony tenderness, no swelling, no pain and no spasm.  Lymphadenopathy:     Head:     Right side of head: No submental, submandibular, tonsillar, preauricular, posterior auricular or occipital adenopathy.     Left side of head: No submental, submandibular, tonsillar, preauricular, posterior auricular or occipital adenopathy.     Cervical: No cervical adenopathy.  Skin:    General: Skin is warm and dry.  Neurological:     Mental Status: She is alert.  Psychiatric:        Speech: Speech normal.        Behavior: Behavior normal.        Thought Content: Thought content normal.        Assessment & Plan:   Problem List Items Addressed This Visit      Respiratory   Pleural effusion associated with pulmonary infection - Primary    Patient well-appearing, SaO2 96.  reviewed emergency room visit with patient.  Moderate left pleural effusion.  Pending pulmonology consult.  Of note, small cardiac effusion also noted.  Patient politely declines echocardiogram today.  She will  await consult with cardiology.   Advised patient to give [place] a call if she is  not heard with regards her appointments in the next week.      Relevant Orders   Basic metabolic panel     Endocrine   Hypothyroidism    No acute infection seen on recent chest x-ray.  Discussed with patient I suspect cold intolerance is likely to be from thyroid not being therapeutic at this time.  She is on increased dose of Synthroid over the past 2 weeks, expressed may take up to 6 to 8 weeks to become euthyroid.  We jointly agreed to continue to monitor this symptom.  Again close vigilance is advised.      Relevant Orders   T3, free   T4, free   TSH     Other   Nonintractable headache    Unchanged.  Discussed the patient etiology of her headaches unclear at this time.  Pending MRI of the brain.  Also discussed patient at length that I suspect congestion contributory.  I feels the patient has had multiple antibiotics this Fall, and we discussed the risk of another antibiotic in regards to severe diarrheal infections, resistance.  We feel this time it is reasonable to try Mucinex for the next couple of days with plenty water to see if improvement of congestion improves headache.  Patient will let me know.  Advised close vigilance.      Neck pain    Continues.  Etiology unclear at this time.  Reviewed CT soft tissue head neck with patient, no gross abnormalities.  Normal exam today as well.  Discussed with patient based on her history, and that if her symptoms do not improve, I do advise that we consult Dr. [name] as patient is a history of breast cancer.  Will call patient later this week to see how she is doing overall, if no improvement, I will emphasize the importance of oncology consult.            I am having Khamia B. Ewan "Tammy" maintain her escitalopram, loperamide, ergocalciferol, clonazePAM, colestipol, Azelastine-Fluticasone, magnesium oxide, ondansetron, potassium chloride, albuterol, pantoprazole, cyanocobalamin, levothyroxine, and ibuprofen. We will continue to  administer ondansetron.   No orders of the defined types were placed in this encounter.   Return precautions given.   Risks, benefits, and alternatives of the medications and treatment plan prescribed today were discussed, and patient expressed understanding.   Education regarding symptom management and diagnosis given to patient on AVS.  Continue to follow with [name], MD for routine health maintenance.   [name] and I agreed with plan.   Mable Paris, FNP

## 2018-05-02 NOTE — Assessment & Plan Note (Signed)
Continues.  Etiology unclear at this time.  Reviewed CT soft tissue head neck with patient, no gross abnormalities.  Normal exam today as well.  Discussed with patient based on her history, and that if her symptoms do not improve, I do advise that we consult Dr. [name] as patient is a history of breast cancer.  Will call patient later this week to see how she is doing overall, if no improvement, I will emphasize the importance of oncology consult.

## 2018-05-05 NOTE — Progress Notes (Signed)
Called Pt to ask her how she is feeling. Pt stated she still have the headache but it is not like it was, it eased up. Pt stated yes,  the Mucinex made her feel better. I scheduled the Pt a lab app for Jan 30th @2 :00pm. I also told Pt that you suggested that she see her Oncologist Dr. [name] if she is not feeling better, because of her history of breast cancer, Pt stated okay and that she understood.

## 2018-05-05 NOTE — Progress Notes (Signed)
Referral made seen in office by NP.

## 2018-05-09 ENCOUNTER — Encounter: Payer: Self-pay | Admitting: Physician Assistant

## 2018-05-09 ENCOUNTER — Telehealth: Payer: Self-pay | Admitting: Family

## 2018-05-09 ENCOUNTER — Ambulatory Visit (INDEPENDENT_AMBULATORY_CARE_PROVIDER_SITE_OTHER): Payer: PPO | Admitting: Physician Assistant

## 2018-05-09 VITALS — BP 124/70 | HR 100 | Ht 64.0 in | Wt 141.0 lb

## 2018-05-09 DIAGNOSIS — I313 Pericardial effusion (noninflammatory): Secondary | ICD-10-CM | POA: Diagnosis not present

## 2018-05-09 DIAGNOSIS — R51 Headache: Secondary | ICD-10-CM | POA: Diagnosis not present

## 2018-05-09 DIAGNOSIS — I319 Disease of pericardium, unspecified: Secondary | ICD-10-CM | POA: Diagnosis not present

## 2018-05-09 DIAGNOSIS — G8929 Other chronic pain: Secondary | ICD-10-CM

## 2018-05-09 DIAGNOSIS — R946 Abnormal results of thyroid function studies: Secondary | ICD-10-CM

## 2018-05-09 DIAGNOSIS — R0789 Other chest pain: Secondary | ICD-10-CM

## 2018-05-09 DIAGNOSIS — R519 Headache, unspecified: Secondary | ICD-10-CM

## 2018-05-09 DIAGNOSIS — R002 Palpitations: Secondary | ICD-10-CM | POA: Diagnosis not present

## 2018-05-09 DIAGNOSIS — J90 Pleural effusion, not elsewhere classified: Secondary | ICD-10-CM | POA: Diagnosis not present

## 2018-05-09 DIAGNOSIS — I3139 Other pericardial effusion (noninflammatory): Secondary | ICD-10-CM

## 2018-05-09 NOTE — Telephone Encounter (Signed)
Patient saw cardiology this morning & patient states possible pericarditis. She has still not heard from anyone in regards to Pulmonology.

## 2018-05-09 NOTE — Progress Notes (Signed)
Cardiology has tried calling her to schedule. They will try a couple more times and if they do not get her scheduled they will close the referral and let us know. Pulmonology has not scheduled her yet.

## 2018-05-09 NOTE — Patient Instructions (Signed)
Medication Instructions:  1- Advil 1 tablet (600 mg) every 6 hours as needed.   If you need a refill on your cardiac medications before your next appointment, please call your pharmacy.   Lab work: Your physician recommends that you return for lab work today (BMET, CBC, Sed rate)  If you have labs (blood work) drawn today and your tests are completely normal, you will receive your results only by: Marland Kitchen MyChart Message (if you have MyChart) OR . A paper copy in the mail If you have any lab test that is abnormal or we need to change your treatment, we will call you to review the results.  Testing/Procedures: 1- Echo Your physician has requested that you have an echocardiogram. Echocardiography is a painless test that uses sound waves to create images of your heart. It provides your doctor with information about the size and shape of your heart and how well your heart's chambers and valves are working. This procedure takes approximately one hour. There are no restrictions for this procedure.    Follow-Up: At [facility], you and your health needs are our priority.  As part of our continuing mission to provide you with exceptional heart care, we have created designated Provider Care Teams.  These Care Teams include your primary Cardiologist (physician) and Advanced Practice Providers (APPs -  Physician Assistants and Nurse Practitioners) who all work together to provide you with the care you need, when you need it. You will need a follow up appointment in 1-2  weeks. You may see Dr. Fletcher Anon  or one of the following Advanced Practice Providers on your designated Care Team:   Murray Hodgkins, NP Christell Faith, PA-C . Marrianne Mood, PA-C

## 2018-05-09 NOTE — Progress Notes (Signed)
Cardiology Office Note Date:  05/09/2018  Patient ID:  Debra Hale, Debra Hale 21-May-1950, MRN [id number] PCP:  [name], MD  Cardiologist:  Dr. Fletcher Anon, MD    Chief Complaint: Chest pain  History of Present Illness: Debra Hale is a 68 y.o. female with history of coronary artery calcifications noted on CT scanning in 2015, recurrent pericarditis with documented small pericardial effusion which resolved with medical therapy, colon cancer status post laparoscopic right colectomy and chemoradiation, right sided breast cancer status post lumpectomy and radiation, thyroid cancer status post thyroidectomy and radiation therapy with postsurgical hypothyroidism, hyperlipidemia, palpitations in the setting of hypokalemia and hypomagnesemia, osteoporosis, liver nodule with negative biopsy, and peripheral neuropathy who presents for evaluation of 63-month history of chest pain.  Patient was seen in 2015 with pericarditis echocardiogram in 05/2013 showed an EF of 55 to 60%, normal wall motion, grade 1 diastolic dysfunction, trivial MR and TR, PASP normal, trivial pericardial effusion was identified.  Sed rate at that time was noted to be elevated at 73.  She was treated with NSAIDs/colchicine with subsequent symptom improvement.  In 04/2016 she was seen for atypical chest pain and underwent exercise tolerance test which was unrevealing outside of poor exercise tolerance and a hypertensive response to exercise.  The patient was only able to exercise for 2 minutes and 40 seconds.  Max blood pressure was 194/88.  She achieved 4.6 METs.  She was most recently seen in the office in 05/2016 for follow-up of her pericarditis and was doing well.  Follow-up echo at that time showed an EF of 55 to 60%, no regional wall motion normalities, grade 1 diastolic dysfunction, PASP normal.  No pericardial effusion was identified.  Throughout the months of November and December 2019, the patient has been dealing with chronic URI  symptoms as well as bronchitis and pneumonia.  In this setting, she has developed chest pain that is sometimes substernal, sometimes on the right side, and sometimes along the left side that is typically worse first thing in the morning and improves throughout the day.  Pain does not wake her up from sleep.  Pain is not exertional.  She is uncertain if this pain is similar to her prior episode of pericarditis.  There is no associated shortness of breath, palpitations, diaphoresis, nausea, vomiting, dizziness, presyncope, or syncope.  She is unable to tell if the pain is worse when laying down as she has been unable to lay down for the past 2 months secondary to severe positional headache when laying down.  She is scheduled for an MRI of the brain per PCP.  She has been taking Advil intermittently with improvement in her chest pain.  She was seen in the ED on 04/21/2018 for evaluation of her chest pain with chest x-ray showing a small left pleural effusion.  CTA of the chest was negative for acute PE with a moderate left pleural effusion with underlying opacities favored to represent atelectasis.  A small to moderate pericardial effusion was noted.  Troponin negative x1.  CBC unremarkable.  Potassium 3.3, serum creatinine 0.97.  Patient was feeling well and the ED and wished to be discharged home.  She was discharged with outpatient follow-up.  She followed up with PCP on 12/30 and continued to note continued myalgias as well as headache.  She has been referred to pulmonology for her left sided pleural effusion and is awaiting appointment with them.  Recheck BMP showed an improved potassium of 4.0.  Lastly, she  notes cold intolerance with recent TSH being severely elevated at 20.43 and is followed by PCP.  Past Medical History:  Diagnosis Date  . Anxiety   . Arthritis    Osteoarthritis  . Breast cancer ([place]) 2009   right breast lumpectomy with rad tx  . Colon cancer ([place])    surgery with chemo and rad  tx  . GERD (gastroesophageal reflux disease)   . Hypothyroidism   . Liver disease   . Liver nodule    s/p negative biopsy  . Malignant neoplasm of thyroid gland ([place]) 2002   s/p surgery and XRT  . Nephrolithiasis    s/p lithotripsy  . Osteoporosis   . Other and unspecified hyperlipidemia   . Palpitations   . Personal history of chemotherapy   . Personal history of malignant neoplasm of large intestine    carcinoma - cecum, s/p right laparoscopic colectomy - s/p chemotherapy and XRT  . Personal history of radiation therapy   . Pure hypercholesterolemia   . Unspecified hereditary and idiopathic peripheral neuropathy     Past Surgical History:  Procedure Laterality Date  . APPENDECTOMY  1985  . BREAST BIOPSY Right 2009   positive  . BREAST BIOPSY Right 2009   negative  . BREAST LUMPECTOMY Right 2009   breast cancer  . CHOLECYSTECTOMY  1995  . COLONOSCOPY    . COLONOSCOPY WITH PROPOFOL N/A 10/29/2017   Procedure: COLONOSCOPY WITH PROPOFOL;  Surgeon: Manya Silvas, MD;  Location: [facility] ENDOSCOPY;  Service: Endoscopy;  Laterality: N/A;  . DILATION AND CURETTAGE OF UTERUS  1990  . DILATION AND CURETTAGE, DIAGNOSTIC / THERAPEUTIC  1990  . ESOPHAGOGASTRODUODENOSCOPY    . ESOPHAGOGASTRODUODENOSCOPY (EGD) WITH PROPOFOL N/A 10/29/2017   Procedure: ESOPHAGOGASTRODUODENOSCOPY (EGD) WITH PROPOFOL;  Surgeon: Manya Silvas, MD;  Location: [facility] ENDOSCOPY;  Service: Endoscopy;  Laterality: N/A;  . LAPAROSCOPIC PARTIAL COLECTOMY     stage 3-C carcinoma of the cecum, s/p chemotherapy and xrt  . LITHOTRIPSY    . SIGMOIDOSCOPY  08/26/1993  . THYROID LOBECTOMY  2002   s/p XRT    Current Meds  Medication Sig  . albuterol (PROVENTIL HFA;VENTOLIN HFA) 108 (90 Base) MCG/ACT inhaler Inhale 2 puffs into the lungs every 6 (six) hours as needed for wheezing or shortness of breath.  . Azelastine-Fluticasone (DYMISTA) 137-50 MCG/ACT SUSP One spray in each nostril bid  . clonazePAM (KLONOPIN)  0.5 MG tablet Take 0.5 mg by mouth 2 (two) times daily.  . colestipol (COLESTID) 1 g tablet TAKE 1 TABLET (1 G TOTAL) BY MOUTH ONCE DAILY MAY INCREASE DOSE TO TWICE DAILY IF NEEDED.  . cyanocobalamin (,VITAMIN B-12,) 1000 MCG/ML injection 1000 mcg (1 mg) injection once per week for four weeks, followed by 1000 mcg injection once per month.  . ergocalciferol (VITAMIN D2) 50000 units capsule Take 1 capsule (50,000 Units total) by mouth once a week.  . escitalopram (LEXAPRO) 20 MG tablet Take 20 mg by mouth daily.    Marland Kitchen ibuprofen (ADVIL,MOTRIN) 600 MG tablet Take 1 tablet (600 mg total) by mouth every 6 (six) hours as needed.  Marland Kitchen levothyroxine (SYNTHROID, LEVOTHROID) 100 MCG tablet Take 1 tablet (100 mcg total) by mouth daily.  Marland Kitchen loperamide (IMODIUM) 1 MG/5ML solution Take 1 mg by mouth as needed for diarrhea or loose stools.  . magnesium oxide (MAG-OX) 400 MG tablet TAKE 1 TABLET BY MOUTH EVERY DAY  . ondansetron (ZOFRAN ODT) 4 MG disintegrating tablet Take 1 tablet (4 mg total) by mouth every  8 (eight) hours as needed for nausea or vomiting.  . pantoprazole (PROTONIX) 40 MG tablet TAKE 1 TABLET BY MOUTH EVERY DAY  . potassium chloride (KLOR-CON 10) 10 MEQ tablet Take 1 tablet (10 mEq total) by mouth daily.   Current Facility-Administered Medications for the 05/09/18 encounter (Office Visit) with Rise Mu, PA-C  Medication  . ondansetron (ZOFRAN) tablet 8 mg    Allergies:   Demeclocycline; Tetracyclines & related; Augmentin [amoxicillin-pot clavulanate]; Bentyl [dicyclomine hcl]; Ciprofloxacin; Codeine; Epinephrine; Flagyl [metronidazole]; Librax [chlordiazepoxide-clidinium]; Phenobarbital; and Ultram [tramadol]   Social History:  The patient  reports that she has never smoked. She has never used smokeless tobacco. She reports that she does not drink alcohol or use drugs.   Family History:  The patient's family history includes Alzheimer's disease in her mother; Breast cancer in her maternal aunt  and sister; Cancer in her father and mother; Colon cancer in her father; Lung cancer in her father and sister; Prostate cancer in her father; Stroke in her mother.  ROS:   Review of Systems  Constitutional: Positive for chills, fever and malaise/fatigue. Negative for diaphoresis and weight loss.  HENT: Positive for congestion, sinus pain and sore throat.   Eyes: Negative for discharge and redness.  Respiratory: Positive for cough. Negative for hemoptysis, sputum production, shortness of breath and wheezing.   Cardiovascular: Positive for chest pain. Negative for palpitations, orthopnea, claudication, leg swelling and PND.  Gastrointestinal: Negative for abdominal pain, blood in stool, heartburn, melena, nausea and vomiting.  Genitourinary: Negative for hematuria.  Musculoskeletal: Positive for joint pain, myalgias and neck pain. Negative for falls.  Skin: Negative for rash.  Neurological: Positive for weakness and headaches. Negative for dizziness, tingling, tremors, sensory change, speech change, focal weakness and loss of consciousness.  Endo/Heme/Allergies: Does not bruise/bleed easily.       Cold intolerance  Psychiatric/Behavioral: Negative for substance abuse. The patient is not nervous/anxious.      PHYSICAL EXAM:  VS:  BP 124/70 (BP Location: Left Arm, Patient Position: Sitting, Cuff Size: Normal)   Pulse 100   Ht 5\' 4"  (1.626 m)   Wt 141 lb (64 kg)   BMI 24.20 kg/m  BMI: Body mass index is 24.2 kg/m.  Physical Exam  Constitutional: She is oriented to person, place, and time. She appears well-developed and well-nourished.  HENT:  Head: Normocephalic and atraumatic.  Eyes: Right eye exhibits no discharge. Left eye exhibits no discharge.  Neck: Normal range of motion. No JVD present.  Cardiovascular: Normal rate, regular rhythm, S1 normal, S2 normal and normal heart sounds. Exam reveals no distant heart sounds, no friction rub, no midsystolic click and no opening snap.  No  murmur heard. Pulses:      Posterior tibial pulses are 2+ on the right side and 2+ on the left side.  Pulmonary/Chest: Effort normal and breath sounds normal. No respiratory distress. She has no decreased breath sounds. She has no wheezes. She has no rales. She exhibits no tenderness.  Abdominal: Soft. She exhibits no distension. There is no abdominal tenderness.  Musculoskeletal:        General: No edema.  Neurological: She is alert and oriented to person, place, and time.  Skin: Skin is warm and dry. No cyanosis. Nails show no clubbing.  Psychiatric: She has a normal mood and affect. Her speech is normal and behavior is normal. Judgment and thought content normal.     EKG:  Was ordered and interpreted by me today. Shows NSR, normal  axis, 100 bpm, low voltage QRS, no significant PR depression, no diffuse ST elevation, nonspecific T wave inversion along inferolateral leads  Recent Labs: 04/18/2018: ALT 38; TSH 20.43 04/21/2018: Hemoglobin 13.2; Platelets 159 05/02/2018: BUN 13; Creatinine, Ser 1.14; Potassium 4.0; Sodium 139  09/23/2017: Cholesterol 211; HDL 47.80; LDL Cholesterol 129; Total CHOL/HDL Ratio 4; Triglycerides 167.0; VLDL 33.4   Estimated Creatinine Clearance: 40.8 mL/min (by C-G formula based on SCr of 1.14 mg/dL).   Wt Readings from Last 3 Encounters:  05/09/18 141 lb (64 kg)  05/02/18 141 lb 12.8 oz (64.3 kg)  04/21/18 141 lb (64 kg)     Other studies reviewed: Additional studies/records reviewed today include: summarized above  ASSESSMENT AND PLAN:  1. Chest pain with presumed pericarditis/pericardial effusion: Patient with recent URI and symptoms consistent with pericarditis.  Patient prefers to avoid colchicine.  In this setting, she will take ibuprofen 600 mg every 6 hours.  Check BMP, CBC, and sed rate today.  Check echocardiogram to evaluate size of pericardial effusion.  There is no evidence of hemodynamic compromise on vital signs today.  Pericardial effusions  can be overestimated on CT scans.  2. Left-sided pleural effusion: Of uncertain etiology.  Patient indicates this is in the location of her recent pneumonia.  She has been referred to pulmonology.  Lungs sound clear to auscultation on exam today.  3. Headache: Of uncertain etiology.  MRI of the brain is pending.  Followed by PCP.  4. Abnormal thyroid function: Likely playing a role in the patient's overall presentation.  Per PCP.   Disposition: F/u with Dr. Fletcher Anon or an APP in 1 week.  Current medicines are reviewed at length with the patient today.  The patient did not have any concerns regarding medicines.  Signed, Christell Faith, PA-C 05/09/2018 12:09 PM     [place] [address] [place], [place] [address] [phone]

## 2018-05-09 NOTE — Telephone Encounter (Signed)
Call pt  Apparently Cardiology has tried calling her to schedule. They will try a couple more times and if they do not get her scheduled they will close the referral and let us know. Has she heard from pulmonolgy re: appt yet?

## 2018-05-10 LAB — CBC
Hematocrit: 36.8 % (ref 34.0–46.6)
Hemoglobin: 12.5 g/dL (ref 11.1–15.9)
MCH: 29.1 pg (ref 26.6–33.0)
MCHC: 34 g/dL (ref 31.5–35.7)
MCV: 86 fL (ref 79–97)
Platelets: 285 10*3/uL (ref 150–450)
RBC: 4.29 x10E6/uL (ref 3.77–5.28)
RDW: 14.4 % (ref 11.7–15.4)
WBC: 2.7 10*3/uL — AB (ref 3.4–10.8)

## 2018-05-10 LAB — BASIC METABOLIC PANEL
BUN/Creatinine Ratio: 11 — ABNORMAL LOW (ref 12–28)
BUN: 11 mg/dL (ref 8–27)
CO2: 20 mmol/L (ref 20–29)
Calcium: 9.1 mg/dL (ref 8.7–10.3)
Chloride: 104 mmol/L (ref 96–106)
Creatinine, Ser: 1 mg/dL (ref 0.57–1.00)
GFR calc Af Amer: 67 mL/min/{1.73_m2} (ref >59)
GFR calc non Af Amer: 58 mL/min/{1.73_m2} — ABNORMAL LOW (ref >59)
GLUCOSE: 96 mg/dL (ref 65–99)
Potassium: 3.6 mmol/L (ref 3.5–5.2)
Sodium: 143 mmol/L (ref 134–144)

## 2018-05-10 LAB — SEDIMENTATION RATE: Sed Rate: 70 mm/hr — ABNORMAL HIGH (ref 0–40)

## 2018-05-11 ENCOUNTER — Telehealth: Payer: Self-pay

## 2018-05-11 NOTE — Telephone Encounter (Signed)
Called to discuss results from lab work that was recently taken.  She verbalized understanding. All questions answered.   She will refer all other questions to PCP, as well as in regards to PCP.   Advised pt to call for any further questions or concerns

## 2018-05-11 NOTE — Telephone Encounter (Signed)
-----   Message from Rise Mu, PA-C sent at 05/10/2018  7:48 AM EST ----- WBC low at 2.7.  Recommend she follow-up with PCP for this. Hemoglobin normal. Renal function normal.   Potassium slightly less than goal.  Recommend she eat foods higher in potassium. Sed rate is elevated.  This is nonspecific though can be seen in pericarditis. Await echocardiogram and continue anti-inflammatory therapy. Recommend she follow-up with PCP for other complaints noted during her office visit.

## 2018-05-12 ENCOUNTER — Ambulatory Visit
Admission: RE | Admit: 2018-05-12 | Discharge: 2018-05-12 | Disposition: A | Discharge status: Home / Self Care | Payer: PPO | Source: Ambulatory Visit | Attending: Family | Admitting: Family

## 2018-05-12 DIAGNOSIS — R519 Headache, unspecified: Secondary | ICD-10-CM

## 2018-05-12 DIAGNOSIS — R51 Headache: Secondary | ICD-10-CM | POA: Diagnosis not present

## 2018-05-12 DIAGNOSIS — G44209 Tension-type headache, unspecified, not intractable: Secondary | ICD-10-CM | POA: Diagnosis not present

## 2018-05-12 MED ORDER — GADOBUTROL 1 MMOL/ML IV SOLN
6.0000 mL | Freq: Once | INTRAVENOUS | Status: AC | PRN
  Administered 2018-05-12: 6 mL via INTRAVENOUS

## 2018-05-13 ENCOUNTER — Other Ambulatory Visit: Payer: Self-pay

## 2018-05-13 ENCOUNTER — Ambulatory Visit (INDEPENDENT_AMBULATORY_CARE_PROVIDER_SITE_OTHER): Payer: PPO

## 2018-05-13 DIAGNOSIS — I319 Disease of pericardium, unspecified: Secondary | ICD-10-CM | POA: Diagnosis not present

## 2018-05-13 NOTE — Telephone Encounter (Signed)
She is scheduled for Jan. 20 with Dr. Patsey Berthold.  She also wants Dr. Nicki Reaper to know that she got the results from her lab work from Cardiology and was told to let you know that her WBC is 2.7

## 2018-05-13 NOTE — Telephone Encounter (Signed)
Melissa, what is status of pulmonology referral?

## 2018-05-13 NOTE — Telephone Encounter (Signed)
I would just recommend repeating a cbc in the next 2-3 weeks to confirm stable.

## 2018-05-13 NOTE — Telephone Encounter (Signed)
Patient is aware and is coming in for appt on 1/16

## 2018-05-16 ENCOUNTER — Telehealth: Payer: Self-pay | Admitting: *Deleted

## 2018-05-16 NOTE — Telephone Encounter (Signed)
No answer. Left message to call back.   Attempted alternative number and no answer or VM after several rings.

## 2018-05-16 NOTE — Telephone Encounter (Signed)
Patient calling back. She verbalized understanding of results.

## 2018-05-16 NOTE — Telephone Encounter (Signed)
-----   Message from Rise Mu, PA-C sent at 05/16/2018  1:21 PM EST ----- Echo showed a pump function of 50-55%, normal wall motion, slightly thickened and stiffened heart. No evidence of pericardial effusion on echo. Keep planned follow up.

## 2018-05-16 NOTE — Telephone Encounter (Signed)
Sending back to you.   I was on this encounter

## 2018-05-16 NOTE — Telephone Encounter (Signed)
Already addressed

## 2018-05-19 ENCOUNTER — Ambulatory Visit (INDEPENDENT_AMBULATORY_CARE_PROVIDER_SITE_OTHER): Payer: PPO | Admitting: Internal Medicine

## 2018-05-19 ENCOUNTER — Encounter: Payer: Self-pay | Admitting: Internal Medicine

## 2018-05-19 VITALS — BP 126/72 | HR 104 | Temp 98.2°F | Resp 16 | Wt 143.4 lb

## 2018-05-19 DIAGNOSIS — R7989 Other specified abnormal findings of blood chemistry: Secondary | ICD-10-CM

## 2018-05-19 DIAGNOSIS — Z853 Personal history of malignant neoplasm of breast: Secondary | ICD-10-CM | POA: Diagnosis not present

## 2018-05-19 DIAGNOSIS — F320 Major depressive disorder, single episode, mild: Secondary | ICD-10-CM

## 2018-05-19 DIAGNOSIS — F439 Reaction to severe stress, unspecified: Secondary | ICD-10-CM | POA: Diagnosis not present

## 2018-05-19 DIAGNOSIS — E7800 Pure hypercholesterolemia, unspecified: Secondary | ICD-10-CM

## 2018-05-19 DIAGNOSIS — R51 Headache: Secondary | ICD-10-CM

## 2018-05-19 DIAGNOSIS — E039 Hypothyroidism, unspecified: Secondary | ICD-10-CM | POA: Diagnosis not present

## 2018-05-19 DIAGNOSIS — I309 Acute pericarditis, unspecified: Secondary | ICD-10-CM | POA: Diagnosis not present

## 2018-05-19 DIAGNOSIS — K219 Gastro-esophageal reflux disease without esophagitis: Secondary | ICD-10-CM

## 2018-05-19 DIAGNOSIS — F32A Depression, unspecified: Secondary | ICD-10-CM

## 2018-05-19 DIAGNOSIS — J189 Pneumonia, unspecified organism: Secondary | ICD-10-CM | POA: Diagnosis not present

## 2018-05-19 DIAGNOSIS — R945 Abnormal results of liver function studies: Secondary | ICD-10-CM

## 2018-05-19 DIAGNOSIS — R519 Headache, unspecified: Secondary | ICD-10-CM

## 2018-05-19 DIAGNOSIS — C73 Malignant neoplasm of thyroid gland: Secondary | ICD-10-CM | POA: Diagnosis not present

## 2018-05-19 DIAGNOSIS — F419 Anxiety disorder, unspecified: Secondary | ICD-10-CM

## 2018-05-19 DIAGNOSIS — J918 Pleural effusion in other conditions classified elsewhere: Secondary | ICD-10-CM

## 2018-05-19 MED ORDER — NYSTATIN 100000 UNIT/ML MT SUSP: 5.0000 mL | Freq: Three times a day (TID) | OROMUCOSAL | 0 refills | Status: DC

## 2018-05-19 NOTE — Progress Notes (Signed)
Patient ID: Debra Hale, female   DOB: November 18, 1950, 68 y.o.   MRN: [id number]   Subjective:    Patient ID: Debra Hale, female    DOB: 1951-03-12, 68 y.o.   MRN: [id number]  HPI  Patient here for a scheduled follow up. Starting around the beginning of October, she has had multiple visits to acute care for sinus infections and bronchitis.  Treated with various abx (including clindamycin, levaquin, bactrim and amoxicillin).  Also was given prednisone on a couple of occasions.  Was seen here on 03/15/18 with complaints of aching, bilateral shoulder pain and nasal congestion.  Shoulder xray revealed mild degenerative changes left shoulder.  Right shoulder xray ok.  CXR mentioned probable tiny effusions versus pleural thickening.  F/u cxr a few days later revealed stable small amount of left pleural thickening or fluid.  Was reevaluated 03/24/18 with pain in clavicle and "lump" on neck. CT neck - negative.  Was referred to ENT for chronic sinusitis.  Saw ENT.  Do not have note, but she reports no changes made.  CT sinuses ok.   She was also referred to ophthalmology.  States everything checked out ok.  Reevaluated here with concerns regarding headache and fatigue (04/18/18).  MRI - unrevealing.  Seen in ED 04/21/18 with left sided chest pain.  CT chest no PE, but did show left sided pleural effusion and question of a small pericardial effusion.  Instructed to take ibuprofen.  Reevaluated 05/02/18 as an acute visit for headache and body aches.  Saw cardiology 05/09/18.  Chest pain was presumed pericarditis/pleural effusion.  Instructed to take ibuprofen.  ECHO did not reveal pericardial effusion.  States she is still having chest discomfort and discomfort in her left shoulder/neck.  No significant pain with rotation of her head.  Chest pain worsens some with palpation and taking deep breath.  No significant cough or congestion.  Taking prilosec.  No vomiting.  Eating, but reports decreased appetite.  Bowels  moving. No fever.  Persistent headache.     Past Medical History:  Diagnosis Date  . Anxiety   . Arthritis    Osteoarthritis  . Breast cancer ([place]) 2009   right breast lumpectomy with rad tx  . Colon cancer ([place])    surgery with chemo and rad tx  . GERD (gastroesophageal reflux disease)   . Hypothyroidism   . Liver disease   . Liver nodule    s/p negative biopsy  . Malignant neoplasm of thyroid gland ([place]) 2002   s/p surgery and XRT  . Nephrolithiasis    s/p lithotripsy  . Osteoporosis   . Other and unspecified hyperlipidemia   . Palpitations   . Personal history of chemotherapy   . Personal history of malignant neoplasm of large intestine    carcinoma - cecum, s/p right laparoscopic colectomy - s/p chemotherapy and XRT  . Personal history of radiation therapy   . Pure hypercholesterolemia   . Unspecified hereditary and idiopathic peripheral neuropathy    Past Surgical History:  Procedure Laterality Date  . APPENDECTOMY  1985  . BREAST BIOPSY Right 2009   positive  . BREAST BIOPSY Right 2009   negative  . BREAST LUMPECTOMY Right 2009   breast cancer  . CHOLECYSTECTOMY  1995  . COLONOSCOPY    . COLONOSCOPY WITH PROPOFOL N/A 10/29/2017   Procedure: COLONOSCOPY WITH PROPOFOL;  Surgeon: Manya Silvas, MD;  Location: [facility] ENDOSCOPY;  Service: Endoscopy;  Laterality: N/A;  . DILATION AND CURETTAGE OF UTERUS  [place], DIAGNOSTIC / THERAPEUTIC  1990  . ESOPHAGOGASTRODUODENOSCOPY    . ESOPHAGOGASTRODUODENOSCOPY (EGD) WITH PROPOFOL N/A 10/29/2017   Procedure: ESOPHAGOGASTRODUODENOSCOPY (EGD) WITH PROPOFOL;  Surgeon: Manya Silvas, MD;  Location: [facility] ENDOSCOPY;  Service: Endoscopy;  Laterality: N/A;  . LAPAROSCOPIC PARTIAL COLECTOMY     stage 3-C carcinoma of the cecum, s/p chemotherapy and xrt  . LITHOTRIPSY    . SIGMOIDOSCOPY  08/26/1993  . THYROID LOBECTOMY  2002   s/p XRT   Family History  Problem Relation Age of Onset  . Stroke Mother         83s  . Alzheimer's disease Mother   . Cancer Mother   . Lung cancer Father   . Prostate cancer Father   . Cancer Father        Colon  . Colon cancer Father   . Breast cancer Sister        74's  . Lung cancer Sister   . Breast cancer Maternal Aunt    Social History   Socioeconomic History  . Marital status: Divorced    Spouse name: Not on file  . Number of children: 0  . Years of education: Not on file  . Highest education level: Not on file  Occupational History  . Not on file  Social Needs  . Financial resource strain: Not on file  . Food insecurity:    Worry: Not on file    Inability: Not on file  . Transportation needs:    Medical: Not on file    Non-medical: Not on file  Tobacco Use  . Smoking status: Never Smoker  . Smokeless tobacco: Never Used  Substance and Sexual Activity  . Alcohol use: No    Alcohol/week: 0.0 standard drinks  . Drug use: No  . Sexual activity: Not Currently  Lifestyle  . Physical activity:    Days per week: Not on file    Minutes per session: Not on file  . Stress: Not on file  Relationships  . Social connections:    Talks on phone: Not on file    Gets together: Not on file    Attends religious service: Not on file    Active member of club or organization: Not on file    Attends meetings of clubs or organizations: Not on file    Relationship status: Not on file  Other Topics Concern  . Not on file  Social History Narrative  . Not on file    Outpatient Encounter Medications as of 05/19/2018  Medication Sig  . albuterol (PROVENTIL HFA;VENTOLIN HFA) 108 (90 Base) MCG/ACT inhaler Inhale 2 puffs into the lungs every 6 (six) hours as needed for wheezing or shortness of breath.  . Azelastine-Fluticasone (DYMISTA) 137-50 MCG/ACT SUSP One spray in each nostril bid  . clonazePAM (KLONOPIN) 0.5 MG tablet Take 0.5 mg by mouth 2 (two) times daily.  . colestipol (COLESTID) 1 g tablet TAKE 1 TABLET (1 G TOTAL) BY MOUTH ONCE DAILY MAY  INCREASE DOSE TO TWICE DAILY IF NEEDED.  . cyanocobalamin (,VITAMIN B-12,) 1000 MCG/ML injection 1000 mcg (1 mg) injection once per week for four weeks, followed by 1000 mcg injection once per month.  . ergocalciferol (VITAMIN D2) 50000 units capsule Take 1 capsule (50,000 Units total) by mouth once a week.  . escitalopram (LEXAPRO) 20 MG tablet Take 20 mg by mouth daily.    Marland Kitchen ibuprofen (ADVIL,MOTRIN) 600 MG tablet Take 1 tablet (600 mg total) by  mouth every 6 (six) hours as needed.  Marland Kitchen levothyroxine (SYNTHROID, LEVOTHROID) 100 MCG tablet Take 1 tablet (100 mcg total) by mouth daily.  Marland Kitchen loperamide (IMODIUM) 1 MG/5ML solution Take 1 mg by mouth as needed for diarrhea or loose stools.  . magnesium oxide (MAG-OX) 400 MG tablet TAKE 1 TABLET BY MOUTH EVERY DAY  . nystatin (MYCOSTATIN) 100000 UNIT/ML suspension Take 5 mLs (500,000 Units total) by mouth 3 (three) times daily. Swish and spit  . ondansetron (ZOFRAN ODT) 4 MG disintegrating tablet Take 1 tablet (4 mg total) by mouth every 8 (eight) hours as needed for nausea or vomiting.  . pantoprazole (PROTONIX) 40 MG tablet TAKE 1 TABLET BY MOUTH EVERY DAY  . potassium chloride (KLOR-CON 10) 10 MEQ tablet Take 1 tablet (10 mEq total) by mouth daily.   Facility-Administered Encounter Medications as of 05/19/2018  Medication  . ondansetron (ZOFRAN) tablet 8 mg    Review of Systems  Constitutional: Negative for fever.       Decreased appetite.   HENT: Negative for congestion and sinus pressure.   Respiratory: Negative for cough and chest tightness.   Cardiovascular: Positive for chest pain. Negative for leg swelling.  Gastrointestinal: Negative for abdominal pain, diarrhea, nausea and vomiting.  Genitourinary: Negative for difficulty urinating and dysuria.  Musculoskeletal: Negative for joint swelling.       Shoulder pain as outlined.    Skin: Negative for color change and rash.  Neurological: Positive for headaches. Negative for dizziness and  light-headedness.  Psychiatric/Behavioral: Negative for agitation.       Increased stress related to her current health issues.         Objective:    Physical Exam Constitutional:      General: She is not in acute distress.    Appearance: Normal appearance.  HENT:     Nose: Nose normal. No congestion.     Mouth/Throat:     Pharynx: No oropharyngeal exudate or posterior oropharyngeal erythema.  Neck:     Musculoskeletal: Neck supple. No muscular tenderness.     Thyroid: No thyromegaly.  Cardiovascular:     Rate and Rhythm: Normal rate and regular rhythm.  Pulmonary:     Effort: No respiratory distress.     Breath sounds: No wheezing.     Comments: Diminished breath sounds lung bases (left lower lung greater).  Abdominal:     General: Bowel sounds are normal.     Palpations: Abdomen is soft.     Tenderness: There is no abdominal tenderness.  Musculoskeletal:        General: No swelling or tenderness.  Lymphadenopathy:     Cervical: No cervical adenopathy.  Skin:    Findings: No erythema or rash.  Neurological:     Mental Status: She is alert.  Psychiatric:        Mood and Affect: Mood normal.        Behavior: Behavior normal.     BP 126/72 (BP Location: Left Arm, Patient Position: Sitting, Cuff Size: Normal)   Pulse (!) 104   Temp 98.2 F (36.8 C) (Oral)   Resp 16   Wt 143 lb 6.4 oz (65 kg)   SpO2 96%   BMI 24.61 kg/m  Wt Readings from Last 3 Encounters:  05/19/18 143 lb 6.4 oz (65 kg)  05/09/18 141 lb (64 kg)  05/02/18 141 lb 12.8 oz (64.3 kg)     Lab Results  Component Value Date   WBC 6.1 05/19/2018   HGB  12.7 05/19/2018   HCT 36.9 05/19/2018   PLT 210.0 05/19/2018   GLUCOSE 113 (H) 05/19/2018   CHOL 211 (H) 09/23/2017   TRIG 167.0 (H) 09/23/2017   HDL 47.80 09/23/2017   LDLDIRECT 126.0 07/11/2015   LDLCALC 129 (H) 09/23/2017   ALT 13 05/19/2018   AST 9 05/19/2018   NA 140 05/19/2018   K 4.2 05/19/2018   CL 105 05/19/2018   CREATININE 1.23  (H) 05/19/2018   BUN 15 05/19/2018   CO2 24 05/19/2018   TSH 2.90 05/19/2018   HGBA1C 5.3 12/10/2016    Mr Brain W Wo Contrast  Result Date: 05/12/2018 CLINICAL DATA:  Non intractable headache EXAM: MRI HEAD WITHOUT AND WITH CONTRAST TECHNIQUE: Multiplanar, multiecho pulse sequences of the brain and surrounding structures were obtained without and with intravenous contrast. CONTRAST:  6 mL Gadovist IV COMPARISON:  MRI head 01/04/2013 FINDINGS: Brain: Ventricle size and cerebral volume normal for age. Scattered small white matter hyperintensities with mild progression from the prior study. Normal brainstem. Negative for acute infarct, hemorrhage, or mass. Normal enhancement postcontrast administration. Vascular: Normal arterial flow voids Skull and upper cervical spine: Negative Sinuses/Orbits: Mild mucosal edema right maxillary and left maxillary sinus. Bilateral cataract surgery Other: None IMPRESSION: No acute abnormality. Mild chronic white matter changes have progressed since 2014. Electronically Signed   By: Franchot Gallo M.D.   On: 05/12/2018 15:22       Assessment & Plan:   Problem List Items Addressed This Visit    Abnormal liver function test    Previously worked up by GI.  Previous CT - no liver mass.  Follow liver function tests.        Acute pericarditis - Primary    Just saw cardiology.  Felt - possible pericarditis.  Taking antiinflammatories.  ECHO with no pericardial effusion.        Relevant Orders   Sedimentation rate (Completed)   Anxiety    Has been followed by psychiatry.  Continue current medication regimen.  Follow.        CARCINOMA, THYROID GLAND, PAPILLARY    On thyroid replacement.  Recent tsh elevated.  Recheck tsh today.  Recent CT neck - ok.        Relevant Orders   TSH (Completed)   GERD (gastroesophageal reflux disease)    No acid reflux symptoms currently.        History of breast cancer    Sees oncology.  Mammogram 10/12/17 - birads I.  With  pleural effusion, chest pain, etc, will d/w oncology regarding reevaluation and further w/up. Seeing pulmonary Monday - for further w/up and evaluation of pleural effusion, etc.        Hypercholesterolemia    Low cholesterol diet and exercise.  Follow lipid panel.        Relevant Orders   CBC with Differential/Platelet (Completed)   Hepatic function panel (Completed)   Basic metabolic panel (Completed)   Hypothyroidism    Recent tsh elevated.  On thyroid replacement.  Recheck tsh today.        Mild depression ([place])    Increased stress related to her current medical issues.  Discussed with her today.  Continue current medications.  Follow.        Nonintractable headache    Has been persistent.  Unclear etiology.  MRI unrevealing.  Has seen ENT and Ophthalmology.  Obtain records.  Recheck esr.  Discussed possible etiologies, for example, msk from her neck/shoulder issues, temporal arteritis, etc.  She declines prednisone today.   Does not appear to have acute sinus issues now.        Pleural effusion associated with pulmonary infection    Previous infection.  No acute infectious symptoms today.  Hold on further abx.  Seeing pulmonary Monday.  Question of need for thoracentesis to determine etiology of her pleural effusion.  Concerning given her history.        Stress    Increased stress related to her current medical issues. Discussed with her today.  Hold on further changes in her medication.            [name], MD

## 2018-05-20 LAB — CBC WITH DIFFERENTIAL/PLATELET
Basophils Absolute: 0 10*3/uL (ref 0.0–0.1)
Basophils Relative: 0.7 % (ref 0.0–3.0)
EOS PCT: 0.2 % (ref 0.0–5.0)
Eosinophils Absolute: 0 10*3/uL (ref 0.0–0.7)
HCT: 36.9 % (ref 36.0–46.0)
Hemoglobin: 12.7 g/dL (ref 12.0–15.0)
Lymphocytes Relative: 8.9 % — ABNORMAL LOW (ref 12.0–46.0)
Lymphs Abs: 0.5 10*3/uL — ABNORMAL LOW (ref 0.7–4.0)
MCHC: 34.4 g/dL (ref 30.0–36.0)
MCV: 86.1 fl (ref 78.0–100.0)
Monocytes Absolute: 0.7 10*3/uL (ref 0.1–1.0)
Monocytes Relative: 12.1 % — ABNORMAL HIGH (ref 3.0–12.0)
Neutro Abs: 4.7 10*3/uL (ref 1.4–7.7)
Neutrophils Relative %: 78.1 % — ABNORMAL HIGH (ref 43.0–77.0)
Platelets: 210 10*3/uL (ref 150.0–400.0)
RBC: 4.28 Mil/uL (ref 3.87–5.11)
RDW: 16 % — ABNORMAL HIGH (ref 11.5–15.5)
WBC: 6.1 10*3/uL (ref 4.0–10.5)

## 2018-05-20 LAB — HEPATIC FUNCTION PANEL
ALT: 13 U/L (ref 0–35)
AST: 9 U/L (ref 0–37)
Albumin: 3.8 g/dL (ref 3.5–5.2)
Alkaline Phosphatase: 83 U/L (ref 39–117)
Bilirubin, Direct: 0.1 mg/dL (ref 0.0–0.3)
Total Bilirubin: 0.9 mg/dL (ref 0.2–1.2)
Total Protein: 7.1 g/dL (ref 6.0–8.3)

## 2018-05-20 LAB — BASIC METABOLIC PANEL
BUN: 15 mg/dL (ref 6–23)
CO2: 24 mEq/L (ref 19–32)
CREATININE: 1.23 mg/dL — AB (ref 0.40–1.20)
Calcium: 9.5 mg/dL (ref 8.4–10.5)
Chloride: 105 mEq/L (ref 96–112)
GFR: 43.35 mL/min — ABNORMAL LOW (ref >60.00)
Glucose, Bld: 113 mg/dL — ABNORMAL HIGH (ref 70–99)
Potassium: 4.2 mEq/L (ref 3.5–5.1)
Sodium: 140 mEq/L (ref 135–145)

## 2018-05-20 LAB — TSH: TSH: 2.9 u[IU]/mL (ref 0.35–4.50)

## 2018-05-20 LAB — SEDIMENTATION RATE: Sed Rate: 67 mm/hr — ABNORMAL HIGH (ref 0–30)

## 2018-05-22 ENCOUNTER — Encounter: Payer: Self-pay | Admitting: Internal Medicine

## 2018-05-22 NOTE — Assessment & Plan Note (Signed)
On thyroid replacement.  Recent tsh elevated.  Recheck tsh today.  Recent CT neck - ok.

## 2018-05-22 NOTE — Progress Notes (Signed)
Cardiology Office Note Date:  05/23/2018  Patient ID:  Debra Hale, Debra Hale 1951-03-20, MRN [id number] PCP:  [name], MD  Cardiologist:  Dr. Fletcher Anon, MD    Chief Complaint: Follow up  History of Present Illness: Debra Hale is a 68 y.o. female with history of coronary artery calcifications noted on CT scanning in 2015, recurrent pericarditis with documented small pericardial effusion which resolved with medical therapy, colon cancer status post laparoscopic right colectomy and chemoradiation, right sided breast cancer status post lumpectomy and radiation, thyroid cancer status post thyroidectomy and radiation therapy with postsurgical hypothyroidism, hyperlipidemia, palpitations in the setting of hypokalemia and hypomagnesemia, osteoporosis, liver nodule with negative biopsy, and peripheral neuropathy who presents for follow up of chest pain.  Patient was seen in 2015 with pericarditis echocardiogram in 05/2013 showed an EF of 55 to 60%, normal wall motion, grade 1 diastolic dysfunction, trivial MR and TR, PASP normal, trivial pericardial effusion was identified.  Sed rate at that time was noted to be elevated at 73.  She was treated with NSAIDs/colchicine with subsequent symptom improvement.  In 04/2016 she was seen for atypical chest pain and underwent exercise tolerance test which was unrevealing outside of poor exercise tolerance and a hypertensive response to exercise.  The patient was only able to exercise for 2 minutes and 40 seconds.  Max blood pressure was 194/88.  She achieved 4.6 METs.  She was most recently seen in the office in 05/2016 for follow-up of her pericarditis and was doing well.  Follow-up echo at that time showed an EF of 55 to 60%, no regional wall motion normalities, grade 1 diastolic dysfunction, PASP normal.  No pericardial effusion was identified.  Throughout the months of November and December 2019, the patient had been dealing with chronic URI symptoms as well  as bronchitis and pneumonia.  In that setting, she developed chest pain that was sometimes substernal, sometimes on the right side, and sometimes along the left side. It was typically worse first thing in the morning and improved throughout the day.  The pain did not wake her up from sleep, and was not exertional. She was seen in the ED on 04/21/2018 for evaluation of her chest pain with chest x-ray showing a small left pleural effusion.  CTA of the chest was negative for acute PE with a moderate left pleural effusion with underlying opacities favored to represent atelectasis.  A small to moderate pericardial effusion was noted.  Troponin negative x1.  Potassium 3.3, serum creatinine 0.97.  Patient was feeling well and the ED and wished to be discharged home.  She was discharged with outpatient follow-up. She followed up with PCP on 12/30 and continued to note continued myalgias as well as headache.  MRI of the brain was unrevealing. Evaluation by ENT and ophthalmology has been unrevealing. She was been referred to pulmonology for her left sided pleural effusion. In follow up with cardiology on 05/09/2018 there was concern she was dealing with pericarditis given her history. Sed rate was elevated at 70. She preferred to treat with NSAIDs and was not started on colchicine. Echo on 05/13/2018 showed an EF of 50-55%, mild concentric LVH, no RWMA, Gr1DD, no pericardial effusion. She saw her PCP in follow up on 05/19/2018 with continued chest pain that was worse with palpation and deep inspiration. She continued to have a headache. Recheck sed rate on 05/19/2018 remained elevated at 67. TSH had normalized. WBC normal at 6.1. SCr 1.23 with a baseline ~ 1.1.  She comes in today continuing to note a mostly centralized chest pain that is worse with coughing and deep inspiration and reproducible to palpation.  Pain is worse when laying down and improves when sitting up.  There is no exertional component to her chest pain.  No  shortness of breath, palpitations, diaphoresis, dizziness, presyncope, or syncope.  She continues to note a chronic intractable headache with associated cold intolerance of the nose.  She was seen by pulmonology today for her pleural effusion.  There is consideration for possible rheumatology referral.  PCP has recommended the patient reestablish with oncology given her history of several malignancies as outlined above.  The patient has noted improvement in her chest pain with ibuprofen though as the ibuprofen wears off the chest pain returns.  Past Medical History:  Diagnosis Date  . Anxiety   . Arthritis    Osteoarthritis  . Breast cancer ([facility]) 2009   right breast lumpectomy with rad tx  . Colon cancer ([place])    surgery with chemo and rad tx  . GERD (gastroesophageal reflux disease)   . Hypothyroidism   . Liver disease   . Liver nodule    s/p negative biopsy  . Malignant neoplasm of thyroid gland ([place]) 2002   s/p surgery and XRT  . Nephrolithiasis    s/p lithotripsy  . Osteoporosis   . Other and unspecified hyperlipidemia   . Palpitations   . Personal history of chemotherapy   . Personal history of malignant neoplasm of large intestine    carcinoma - cecum, s/p right laparoscopic colectomy - s/p chemotherapy and XRT  . Personal history of radiation therapy   . Pure hypercholesterolemia   . Unspecified hereditary and idiopathic peripheral neuropathy     Past Surgical History:  Procedure Laterality Date  . APPENDECTOMY  1985  . BREAST BIOPSY Right 2009   positive  . BREAST BIOPSY Right 2009   negative  . BREAST LUMPECTOMY Right 2009   breast cancer  . CHOLECYSTECTOMY  1995  . COLONOSCOPY    . COLONOSCOPY WITH PROPOFOL N/A 10/29/2017   Procedure: COLONOSCOPY WITH PROPOFOL;  Surgeon: Manya Silvas, MD;  Location: [facility] ENDOSCOPY;  Service: Endoscopy;  Laterality: N/A;  . DILATION AND CURETTAGE OF UTERUS  1990  . DILATION AND CURETTAGE, DIAGNOSTIC / THERAPEUTIC  1990  .  ESOPHAGOGASTRODUODENOSCOPY    . ESOPHAGOGASTRODUODENOSCOPY (EGD) WITH PROPOFOL N/A 10/29/2017   Procedure: ESOPHAGOGASTRODUODENOSCOPY (EGD) WITH PROPOFOL;  Surgeon: Manya Silvas, MD;  Location[facility] END[facility]COPY;  Service: Endoscopy;  Laterality: N/A;  . LAPAROSCOPIC PARTIAL COLECTOMY     stage 3-C carcinoma of the cecum, s/p chemotherapy and xrt  . LITHOTRIPSY    . SIGMOIDOSCOPY  08/26/1993  . THYROID LOBECTOMY  2002   s/p XRT    Current Meds  Medication Sig  . albuterol (PROVENTIL HFA;VENTOLIN HFA) 108 (90 Base) MCG/ACT inhaler Inhale 2 puffs into the lungs every 6 (six) hours as needed for wheezing or shortness of breath.  . Azelastine-Fluticasone (DYMISTA) 137-50 MCG/ACT SUSP One spray in each nostril bid  . clonazePAM (KLONOPIN) 0.5 MG tablet Take 0.5 mg by mouth 2 (two) times daily.  . colestipol (COLESTID) 1 g tablet TAKE 1 TABLET (1 G TOTAL) BY MOUTH ONCE DAILY MAY INCREASE DOSE TO TWICE DAILY IF NEEDED.  . cyanocobalamin (,VITAMIN B-12,) 1000 MCG/ML injection 1000 mcg (1 mg) injection once per week for four weeks, followed by 1000 mcg injection once per month.  . ergocalciferol (VITAMIN D2) 50000 units capsule  Take 1 capsule (50,000 Units total) by mouth once a week.  . escitalopram (LEXAPRO) 20 MG tablet Take 20 mg by mouth daily.    Marland Kitchen ibuprofen (ADVIL,MOTRIN) 600 MG tablet Take 1 tablet (600 mg total) by mouth every 6 (six) hours as needed.  Marland Kitchen levothyroxine (SYNTHROID, LEVOTHROID) 100 MCG tablet Take 1 tablet (100 mcg total) by mouth daily.  Marland Kitchen loperamide (IMODIUM) 1 MG/5ML solution Take 1 mg by mouth as needed for diarrhea or loose stools.  . magnesium oxide (MAG-OX) 400 MG tablet TAKE 1 TABLET BY MOUTH EVERY DAY  . nystatin (MYCOSTATIN) 100000 UNIT/ML suspension Take 5 mLs (500,000 Units total) by mouth 3 (three) times daily. Swish and spit  . ondansetron (ZOFRAN ODT) 4 MG disintegrating tablet Take 1 tablet (4 mg total) by mouth every 8 (eight) hours as needed for nausea or  vomiting.  . pantoprazole (PROTONIX) 40 MG tablet TAKE 1 TABLET BY MOUTH EVERY DAY  . potassium chloride (KLOR-CON 10) 10 MEQ tablet Take 1 tablet (10 mEq total) by mouth daily.   Current Facility-Administered Medications for the 05/23/18 encounter (Office Visit) with Rise Mu, PA-C  Medication  . ondansetron (ZOFRAN) tablet 8 mg    Allergies:   Demeclocycline; Tetracyclines & related; Augmentin [amoxicillin-pot clavulanate]; Bentyl [dicyclomine hcl]; Ciprofloxacin; Codeine; Epinephrine; Flagyl [metronidazole]; Librax [chlordiazepoxide-clidinium]; Phenobarbital; and Ultram [tramadol]   Social History:  The patient  reports that she has never smoked. She has never used smokeless tobacco. She reports that she does not drink alcohol or use drugs.   Family History:  The patient's family history includes Alzheimer's disease in her mother; Breast cancer in her maternal aunt and sister; Cancer in her father and mother; Colon cancer in her father; Lung cancer in her father and sister; Prostate cancer in her father; Stroke in her mother.  ROS:   Review of Systems  Constitutional: Positive for malaise/fatigue. Negative for chills, diaphoresis, fever and weight loss.  HENT: Negative for congestion.   Eyes: Negative for discharge and redness.  Respiratory: Negative for cough, hemoptysis, sputum production, shortness of breath and wheezing.   Cardiovascular: Positive for chest pain. Negative for palpitations, orthopnea, claudication, leg swelling and PND.  Gastrointestinal: Negative for abdominal pain, blood in stool, heartburn, melena, nausea and vomiting.  Genitourinary: Negative for hematuria.  Musculoskeletal: Negative for falls and myalgias.  Skin: Negative for rash.  Neurological: Positive for weakness and headaches. Negative for dizziness, tingling, tremors, sensory change, speech change, focal weakness and loss of consciousness.  Endo/Heme/Allergies: Does not bruise/bleed easily.       Cold  intolerance  Psychiatric/Behavioral: Negative for substance abuse. The patient is not nervous/anxious.   All other systems reviewed and are negative.    PHYSICAL EXAM:  VS:  BP 138/76 (BP Location: Left Arm, Patient Position: Sitting, Cuff Size: Normal)   Pulse 83   Ht 5\' 4"  (1.626 m)   Wt 140 lb (63.5 kg)   BMI 24.03 kg/m  BMI: Body mass index is 24.03 kg/m.  Physical Exam  Constitutional: She is oriented to person, place, and time. She appears well-developed and well-nourished.  HENT:  Head: Normocephalic and atraumatic.  Eyes: Right eye exhibits no discharge. Left eye exhibits no discharge.  Neck: Normal range of motion. No JVD present.  Cardiovascular: Normal rate, regular rhythm, S1 normal, S2 normal and normal heart sounds. Exam reveals no distant heart sounds, no friction rub, no midsystolic click and no opening snap.  No murmur heard. Pulses:  Posterior tibial pulses are 2+ on the right side and 2+ on the left side.  Chest pain is fully reproducible to palpation and exacerbated by inspiration when listening to her posterior chest for lung examination  Pulmonary/Chest: Effort normal and breath sounds normal. No respiratory distress. She has no decreased breath sounds. She has no wheezes. She has no rales. She exhibits no tenderness.  Abdominal: Soft. She exhibits no distension. There is no abdominal tenderness.  Musculoskeletal:        General: No edema.  Neurological: She is alert and oriented to person, place, and time.  Skin: Skin is warm and dry. No cyanosis. Nails show no clubbing.  Psychiatric: She has a normal mood and affect. Her speech is normal and behavior is normal. Judgment and thought content normal.     EKG:  Was ordered and interpreted by me today. Shows NSR, 83 bpm, normal axis, low voltage QRS, nonspecific ST-T changes (unchanged from prior)  Recent Labs: 05/19/2018: ALT 13; BUN 15; Creatinine, Ser 1.23; Hemoglobin 12.7; Platelets 210.0; Potassium  4.2; Sodium 140; TSH 2.90  09/23/2017: Cholesterol 211; HDL 47.80; LDL Cholesterol 129; Total CHOL/HDL Ratio 4; Triglycerides 167.0; VLDL 33.4   Estimated Creatinine Clearance: 37.8 mL/min (A) (by C-G formula based on SCr of 1.23 mg/dL (H)).   Wt Readings from Last 3 Encounters:  05/23/18 140 lb (63.5 kg)  05/23/18 140 lb 9.6 oz (63.8 kg)  05/19/18 143 lb 6.4 oz (65 kg)     Other studies reviewed: Additional studies/records reviewed today include: summarized above  ASSESSMENT AND PLAN:  1. Chest pain with presumed pericarditis: She has noted some improvement with ibuprofen though continues to have symptoms consistent with pericarditis.  It was noted she did not have a pericardial effusion on echocardiogram though this does not exclude the diagnosis of pericarditis.  I recommend we place her on colchicine 0.6 mg twice daily and continue ibuprofen 600 mg every 6 hours.  She has been advised to hydrate appropriately.  If needed, can send in Zofran for nausea if this is noted with colchicine.  Recheck in 2 weeks for symptom improvement.  Would attempt to defer longstanding steroid use for now.  2. Left-sided pleural effusion: Evaluated by pulmonology today.  PCP has recommended re-referral to oncology given the patient's cancer history.  3. Headache/cold intolerance: MRI of the brain was unrevealing.  Defer to PCP.  4. Abnormal thyroid function: Per PCP.  Disposition: F/u with Dr. Fletcher Anon or an APP in 2 weeks.  Current medicines are reviewed at length with the patient today.  The patient did not have any concerns regarding medicines.  Signed, Christell Faith, PA-C 05/23/2018 3:22 PM     [facility] [address] [place] [address] [place], [place] [address] [phone]

## 2018-05-22 NOTE — Assessment & Plan Note (Signed)
Increased stress related to her current medical issues.  Discussed with her today.  Continue current medications.  Follow.

## 2018-05-22 NOTE — Assessment & Plan Note (Signed)
Previously worked up by GI.  Previous CT - no liver mass.  Follow liver function tests.   

## 2018-05-22 NOTE — Assessment & Plan Note (Signed)
Just saw cardiology.  Felt - possible pericarditis.  Taking antiinflammatories.  ECHO with no pericardial effusion.

## 2018-05-22 NOTE — Assessment & Plan Note (Addendum)
Has been persistent.  Unclear etiology.  MRI unrevealing.  Has seen ENT and Ophthalmology.  Obtain records.  Recheck esr.  Discussed possible etiologies, for example, msk from her neck/shoulder issues, temporal arteritis, etc.  She declines prednisone today.   Does not appear to have acute sinus issues now.

## 2018-05-22 NOTE — Assessment & Plan Note (Signed)
No acid reflux symptoms currently.

## 2018-05-22 NOTE — Assessment & Plan Note (Signed)
Has been followed by psychiatry.  Continue current medication regimen.  Follow.

## 2018-05-22 NOTE — Assessment & Plan Note (Signed)
Sees oncology.  Mammogram 10/12/17 - birads I.  With pleural effusion, chest pain, etc, will d/w oncology regarding reevaluation and further w/up. Seeing pulmonary Monday - for further w/up and evaluation of pleural effusion, etc.

## 2018-05-22 NOTE — Assessment & Plan Note (Signed)
Previous infection.  No acute infectious symptoms today.  Hold on further abx.  Seeing pulmonary Monday.  Question of need for thoracentesis to determine etiology of her pleural effusion.  Concerning given her history.

## 2018-05-22 NOTE — Assessment & Plan Note (Signed)
Low cholesterol diet and exercise.  Follow lipid panel.   

## 2018-05-22 NOTE — Assessment & Plan Note (Signed)
Recent tsh elevated.  On thyroid replacement.  Recheck tsh today.

## 2018-05-22 NOTE — Assessment & Plan Note (Signed)
Increased stress related to her current medical issues. Discussed with her today.  Hold on further changes in her medication.

## 2018-05-23 ENCOUNTER — Encounter: Payer: Self-pay | Admitting: Physician Assistant

## 2018-05-23 ENCOUNTER — Ambulatory Visit: Payer: PPO | Admitting: Pulmonary Disease

## 2018-05-23 ENCOUNTER — Ambulatory Visit (INDEPENDENT_AMBULATORY_CARE_PROVIDER_SITE_OTHER): Payer: PPO | Admitting: Physician Assistant

## 2018-05-23 ENCOUNTER — Other Ambulatory Visit: Payer: Self-pay | Admitting: Internal Medicine

## 2018-05-23 ENCOUNTER — Encounter: Payer: Self-pay | Admitting: Pulmonary Disease

## 2018-05-23 VITALS — BP 122/80 | HR 94 | Ht 64.0 in | Wt 140.6 lb

## 2018-05-23 VITALS — BP 138/76 | HR 83 | Ht 64.0 in | Wt 140.0 lb

## 2018-05-23 DIAGNOSIS — R51 Headache: Secondary | ICD-10-CM

## 2018-05-23 DIAGNOSIS — I319 Disease of pericardium, unspecified: Secondary | ICD-10-CM

## 2018-05-23 DIAGNOSIS — J90 Pleural effusion, not elsewhere classified: Secondary | ICD-10-CM

## 2018-05-23 DIAGNOSIS — R05 Cough: Secondary | ICD-10-CM | POA: Diagnosis not present

## 2018-05-23 DIAGNOSIS — R6889 Other general symptoms and signs: Secondary | ICD-10-CM

## 2018-05-23 DIAGNOSIS — R946 Abnormal results of thyroid function studies: Secondary | ICD-10-CM | POA: Diagnosis not present

## 2018-05-23 DIAGNOSIS — R519 Headache, unspecified: Secondary | ICD-10-CM

## 2018-05-23 DIAGNOSIS — IMO0002 Reserved for concepts with insufficient information to code with codable children: Secondary | ICD-10-CM

## 2018-05-23 DIAGNOSIS — R0600 Dyspnea, unspecified: Secondary | ICD-10-CM | POA: Diagnosis not present

## 2018-05-23 DIAGNOSIS — G8929 Other chronic pain: Secondary | ICD-10-CM

## 2018-05-23 DIAGNOSIS — R7989 Other specified abnormal findings of blood chemistry: Secondary | ICD-10-CM

## 2018-05-23 MED ORDER — COLCHICINE 0.6 MG PO TABS: 0.6000 mg | ORAL_TABLET | Freq: Two times a day (BID) | ORAL | 3 refills | Status: DC

## 2018-05-23 NOTE — Patient Instructions (Addendum)
1.  Dr. Bary Leriche office will be in touch with you they will be referring you to rheumatology and probably giving you a trial of prednisone.  I concur with this.  2.  Please do not hesitate to come back to see [place] if follow-up any breathing problems.

## 2018-05-23 NOTE — Patient Instructions (Signed)
Medication Instructions:  Your physician has recommended you make the following change in your medication: 1- START colchicine Take 1 tablet (0.6 mg total) by mouth 2 (two) times daily  If you need a refill on your cardiac medications before your next appointment, please call your pharmacy.   Lab work: None ordered  If you have labs (blood work) drawn today and your tests are completely normal, you will receive your results only by: Marland Kitchen MyChart Message (if you have MyChart) OR . A paper copy in the mail If you have any lab test that is abnormal or we need to change your treatment, we will call you to review the results.  Testing/Procedures: None ordered   Follow-Up: At [facility], you and your health needs are our priority.  As part of our continuing mission to provide you with exceptional heart care, we have created designated Provider Care Teams.  These Care Teams include your primary Cardiologist (physician) and Advanced Practice Providers (APPs -  Physician Assistants and Nurse Practitioners) who all work together to provide you with the care you need, when you need it. You will need a follow up appointment in 2 weeks.  You may see Dr. Fletcher Anon or one of the following Advanced Practice Providers on your designated Care Team:   Murray Hodgkins, NP Christell Faith, PA-C . Marrianne Mood, PA-C  Any Other Special Instructions Will Be Listed Below (If Applicable). 1- Use "hothands" thermal pads as needed for cold nose. Please follow manufacture instructions for safety concerns

## 2018-05-23 NOTE — Progress Notes (Signed)
Subjective:    Patient ID: Debra Hale, female    DOB: 1950-12-20, 68 y.o.   MRN: [id number]  HPI [name] is a 68 year old lifelong never smoker who presents for evaluation of chest pain.  She is kindly referred by Dr. [name].  The patient does not describe any dyspnea no cough or any other symptomatology.  Her chest pain is described mostly as shoulder pain.  This is bilateral.  She notices decrease range of motion unless she takes nonsteroidals.  She states that she feels like she is "freezing all the time" particularly around her nose.  In December she did have issues with chest pain that were pleuritic.  She was imaged on 19 December with a CT angios chest to evaluate for potential PE.  She did not have PE but was noted to have a left pleural effusion and a small to moderate pericardial effusion.  Was an episode of pleuropericarditis.  We are also asked to render opinion with regards to the left pleural effusion.  It was described as moderate on CT it is actually too small for sampling without CT guidance as it is very small on chest x-ray.  She has not had follow-up chest imaging.  She did have follow-up 2D echo that showed she did not have a pericardial effusion.  She was treated with nonsteroidals.  She has various other symptoms that are not respiratory this include headache the aforementioned shoulder pain which is "terrible" and feeling of loss of vim, vigor and vitality.  She has had modest elevation on sed rate and relative leukopenia on lab work.  Patient does not endorse orthopnea, paroxysmal nocturnal dyspnea or lower extremity edema.  As noted her chest pain has resolved mostly her pain is localized in the shoulder area bilaterally.  She does not have cough, sputum production or hemoptysis.  No she has the aforementioned chills she has not had any fevers or sweats.  Her past medical history, surgical history and family history have been reviewed.  Medications have been  reviewed.   Review of Systems  Constitutional: Positive for chills and fatigue.  HENT: Negative.   Eyes: Negative.   Respiratory: Negative.   Cardiovascular: Positive for palpitations.  Gastrointestinal: Negative.   Endocrine: Negative.   Genitourinary: Negative.   Musculoskeletal: Positive for arthralgias (Bilateral shoulder pain).  Skin: Negative.   Allergic/Immunologic: Negative.   Neurological: Positive for weakness and headaches.  Hematological: Negative.   Psychiatric/Behavioral: Negative.   All other systems reviewed and are negative.      Objective:   Physical Exam Vitals signs reviewed.  Constitutional:      Appearance: She is well-developed and normal weight.  HENT:     Head: Normocephalic and atraumatic.     Mouth/Throat:     Mouth: Mucous membranes are moist.     Pharynx: Oropharynx is clear.  Eyes:     General: No scleral icterus.    Extraocular Movements: Extraocular movements intact.     Pupils: Pupils are equal, round, and reactive to light.  Neck:     Musculoskeletal: Neck supple. No neck rigidity.  Cardiovascular:     Rate and Rhythm: Normal rate and regular rhythm.     Heart sounds: Normal heart sounds.  Pulmonary:     Effort: Pulmonary effort is normal.     Breath sounds: Normal breath sounds.  Abdominal:     General: Bowel sounds are normal. There is no distension.     Palpations: Abdomen is soft.  Musculoskeletal:     Comments: Somewhat limited shoulder range of motion on arm raising   Lymphadenopathy:     Cervical: No cervical adenopathy.  Neurological:     Mental Status: She is alert and oriented to person, place, and time.     Cranial Nerves: No facial asymmetry.     Coordination: Coordination normal.  Psychiatric:        Mood and Affect: Mood is depressed.        Behavior: Behavior normal.     Spirometry was performed and independently reviewed it did not show obstructive defect.        Assessment & Plan:   1.   Pleuropericarditis: Given the patient's constellation of symptoms I suspect that this is related to inflammatory systemic disease.  Her constellation of symptoms is consistent with potential polymyalgia rheumatica versus temporal arteritis (giant cell arteritis).  On exam today she did not exhibit decreased breath sounds on the left base.  Her pericardial effusion seem to have resolved as well.  Have recommended evaluation by rheumatology and perhaps a trial of prednisone.  Discussed the above with Dr. [name] who is also in agreement with the same.  Would recommend getting chest x-ray PA and lateral after prednisone has been started to evaluate for complete resolution of the very small pleural effusion she had in December.  2.  Dyspnea, characterized more as fatigue: No evidence of obstruction on spirometry.  No overt evidence of muscle weakness on spirometry.  I suspect her fatigue is related to possible polymyalgia rheumatica.  We will see the patient in follow-up on an as-needed basis.  I discussed all of the above with Dr. [name].  Thank you for allowing [place] to participate in this patient's care.

## 2018-05-23 NOTE — Progress Notes (Signed)
Order placed for f/u lab.   

## 2018-05-24 ENCOUNTER — Other Ambulatory Visit: Payer: Self-pay | Admitting: Internal Medicine

## 2018-05-24 DIAGNOSIS — R700 Elevated erythrocyte sedimentation rate: Secondary | ICD-10-CM

## 2018-05-24 DIAGNOSIS — R519 Headache, unspecified: Secondary | ICD-10-CM

## 2018-05-24 DIAGNOSIS — M25511 Pain in right shoulder: Secondary | ICD-10-CM

## 2018-05-24 DIAGNOSIS — R51 Headache: Secondary | ICD-10-CM

## 2018-05-24 DIAGNOSIS — M25512 Pain in left shoulder: Secondary | ICD-10-CM

## 2018-05-24 NOTE — Progress Notes (Signed)
Order placed for rheumatology referral.  

## 2018-05-25 ENCOUNTER — Telehealth: Payer: Self-pay | Admitting: Cardiovascular Disease

## 2018-05-25 MED ORDER — ONDANSETRON HCL 4 MG PO TABS: 4.0000 mg | ORAL_TABLET | Freq: Three times a day (TID) | ORAL | 0 refills | Status: DC | PRN

## 2018-05-25 NOTE — Telephone Encounter (Signed)
Ok to send in Zofran 4 mg tid prn nausea.

## 2018-05-25 NOTE — Telephone Encounter (Signed)
Pt requesting refill on Zofran please advise if ok to refill. Pt claims at last OV ryan mentioned he would fill but wasn't in at last visit 05/23/2018.

## 2018-05-25 NOTE — Telephone Encounter (Signed)
Spoke with patient and she thinks she took tablets not ODT previously. She is ok with tablets being sent to [facility]

## 2018-05-25 NOTE — Telephone Encounter (Signed)
Will forward to [employer] PA who saw patient recently for review

## 2018-05-25 NOTE — Telephone Encounter (Signed)
Rx for Zofran sent to [facility] as requested, left patient message. After sending Rx realized patient has had Zofran ODT 4 mg in past. Left message to call back to see is she prefers ODT or regular, per pharmacy insurance may not cover ODT secondary to cost

## 2018-05-25 NOTE — Telephone Encounter (Signed)
Patient calling States that along with the other medications prescribed R Dunn mentioned sending in Zofran to help with nausea Prescription was not send in, patient would like for Zofran to be sent to [facility] on [address] Please advise

## 2018-05-27 ENCOUNTER — Other Ambulatory Visit (INDEPENDENT_AMBULATORY_CARE_PROVIDER_SITE_OTHER): Payer: PPO

## 2018-05-27 DIAGNOSIS — R7989 Other specified abnormal findings of blood chemistry: Secondary | ICD-10-CM | POA: Diagnosis not present

## 2018-05-27 LAB — BASIC METABOLIC PANEL
BUN: 21 mg/dL (ref 6–23)
CO2: 24 mEq/L (ref 19–32)
Calcium: 9 mg/dL (ref 8.4–10.5)
Chloride: 111 mEq/L (ref 96–112)
Creatinine, Ser: 1.41 mg/dL — ABNORMAL HIGH (ref 0.40–1.20)
GFR: 37.03 mL/min — ABNORMAL LOW (ref >60.00)
Glucose, Bld: 101 mg/dL — ABNORMAL HIGH (ref 70–99)
POTASSIUM: 4 meq/L (ref 3.5–5.1)
Sodium: 145 mEq/L (ref 135–145)

## 2018-05-30 ENCOUNTER — Other Ambulatory Visit: Payer: Self-pay | Admitting: Internal Medicine

## 2018-05-30 DIAGNOSIS — R944 Abnormal results of kidney function studies: Secondary | ICD-10-CM

## 2018-05-30 DIAGNOSIS — E876 Hypokalemia: Secondary | ICD-10-CM

## 2018-05-30 NOTE — Progress Notes (Signed)
Order placed for f/u met b 

## 2018-05-30 NOTE — Progress Notes (Signed)
Order placed for f/u potassium.  

## 2018-05-31 ENCOUNTER — Other Ambulatory Visit: Payer: Self-pay | Admitting: Internal Medicine

## 2018-05-31 DIAGNOSIS — R700 Elevated erythrocyte sedimentation rate: Secondary | ICD-10-CM | POA: Insufficient documentation

## 2018-05-31 DIAGNOSIS — R634 Abnormal weight loss: Secondary | ICD-10-CM | POA: Diagnosis not present

## 2018-05-31 DIAGNOSIS — R519 Headache, unspecified: Secondary | ICD-10-CM | POA: Insufficient documentation

## 2018-05-31 DIAGNOSIS — R5382 Chronic fatigue, unspecified: Secondary | ICD-10-CM | POA: Diagnosis not present

## 2018-05-31 DIAGNOSIS — R51 Headache: Secondary | ICD-10-CM | POA: Diagnosis not present

## 2018-05-31 DIAGNOSIS — M810 Age-related osteoporosis without current pathological fracture: Secondary | ICD-10-CM | POA: Diagnosis not present

## 2018-06-01 ENCOUNTER — Encounter: Payer: Self-pay | Admitting: Pulmonary Disease

## 2018-06-01 ENCOUNTER — Ambulatory Visit: Payer: Self-pay | Admitting: Surgery

## 2018-06-01 DIAGNOSIS — M316 Other giant cell arteritis: Secondary | ICD-10-CM | POA: Diagnosis not present

## 2018-06-01 NOTE — H&P (Signed)
Subjective:   CC: Temporal arteritis (CMS-HCC) [M31.6]  HPI:  Debra Hale is a 68 y.o. female who was referred by Marlowe Sax,* for evaluation of above. First noted a few weeks ago.  Symptoms include: headache, vision changes, temperature intolerance.  Workup with primary and rheum noted elevated sed rate, concerning for temporal arteritis.  Here today for possible biopsy.     Past Medical History:  has a past medical history of Acute pericarditis, Anxiety, Breast cancer (CMS-HCC), Chronic kidney disease, Colon cancer (CMS-HCC), Fatty liver disease, nonalcoholic, GERD (gastroesophageal reflux disease), H/O lithotripsy, Hemorrhage of rectum and anus, History of bone density study (05/20/2010), Hypothyroid, unspecified, Liver disease, Liver nodule, Nephrolithiasis, Osteoarthritis, Osteoporosis, Palpitations, Personal history of colon cancer, stage III, Personal history of colonic polyps, Personal history of malignant neoplasm of breast (2009), Personal history of malignant neoplasm of thyroid (2002), Pleural effusion associated with pulmonary infection, Pure hypercholesterolemia, Thyroid cancer (CMS-HCC), Ulcer, and Unspecified hypothyroidism.  Past Surgical History:  has a past surgical history that includes Appendectomy; Laparoscopic Colectomy Partial W/Anastamosis (Right); Mastectomy Partial (Right, 2009); Appendectomy (1985); Thyroidectomy W/Limited Neck Dissection (2002); Dilation and curettage, diagnostic / therapeutic (1990); Lithotripsy; egd (05/18/2011, 04/03/2004); Sigmoidoscopy (08/26/1993); Colonoscopy (04/03/2004); Colonoscopy (06/29/2005); Colonoscopy (09/21/2007); Colonoscopy (07/29/2009); Colonoscopy (05/18/2011); Colonoscopy (08/17/2011); Colonoscopy (08/02/2014); Cholecystectomy; egd (10/29/2017); Colonoscopy (10/29/2017); Laparoscopic Colectomy Partial W/Anastamosis; and Breast excisional biopsy.  Family History: family history includes Alzheimer's disease in her mother;  Arthritis in her mother; Brain cancer (age of onset: 74) in an other family member; Breast cancer in her maternal aunt and sister; Cancer in her mother and sister; Colon cancer in her father; Kidney cancer in an other family member; Lung cancer in her father and sister; Prostate cancer in her father; Stroke in her mother.  Social History:  reports that she has never smoked. She has never used smokeless tobacco. She reports that she does not drink alcohol or use drugs.  Current Medications: has a current medication list which includes the following prescription(s): acetaminophen, clonazepam, colestipol, cyanocobalamin, ergocalciferol (vitamin d2), escitalopram oxalate, klor-con 10, levothyroxine, magnesium oxide, nystatin, ondansetron, pantoprazole, and prednisone.  Allergies:       Allergies  Allergen Reactions  . Augmentin [Amoxicillin-Pot Clavulanate] Unknown  . Bentyl [Dicyclomine] Unknown  . Ciprofloxacin Diarrhea  . Codeine Dizziness  . Demeclocycline Other (See Comments)    Throat swells  . Epinephrine Unknown  . Flagyl [Metronidazole Hcl] Unknown  . Librax (With Clidinium) [Chlordiazepoxide-Clidinium] Unknown  . Phenobarbital Unknown  . Tetracycline Swelling    Throat swelling  . Ultram [Tramadol] Nausea    Sick feeling    ROS:  A 15 point review of systems was performed and pertinent positives and negatives noted in HPI   Objective:   BP 111/72   Pulse 81   Ht 163.8 cm (5' 4.5")   Wt 63.5 kg (139 lb 15.9 oz)   BMI 23.66 kg/m   Constitutional :  alert, appears stated age, cooperative and no distress  Lymphatics/Throat:  no asymmetry, masses, or scars  Respiratory:  clear to auscultation bilaterally  Cardiovascular:  regular rate and rhythm  Gastrointestinal: soft, non-tender; bowel sounds normal; no masses,  no organomegaly.    Musculoskeletal: Steady gait and movement  Skin: Cool and moist  Psychiatric: Normal affect, non-agitated, not confused        LABS:  ESR 67. 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  Benefits include possible pathologic evaluation.  Discussed the risk of surgery including post-op infxn, poor cosmesis, poor/delayed wound healing, and possible re-operation to address said risks.   The patient verbalized understanding and all questions were answered to the patient's satisfaction.  2. Patient has elected to proceed with surgical treatment after final discussion with primary.  Procedure will be scheduled.  Written consent was obtained.  BIOPSY OF LEFT OR RIGHT per RHEUM.  WILL START WITH RIGHT SIDE     Electronically signed by Benjamine Sprague, DO on 06/01/2018 10:49 AM

## 2018-06-02 ENCOUNTER — Other Ambulatory Visit (INDEPENDENT_AMBULATORY_CARE_PROVIDER_SITE_OTHER): Payer: PPO

## 2018-06-02 DIAGNOSIS — J069 Acute upper respiratory infection, unspecified: Secondary | ICD-10-CM | POA: Diagnosis not present

## 2018-06-02 DIAGNOSIS — E876 Hypokalemia: Secondary | ICD-10-CM

## 2018-06-02 DIAGNOSIS — R52 Pain, unspecified: Secondary | ICD-10-CM | POA: Diagnosis not present

## 2018-06-02 DIAGNOSIS — J029 Acute pharyngitis, unspecified: Secondary | ICD-10-CM | POA: Diagnosis not present

## 2018-06-02 DIAGNOSIS — R944 Abnormal results of kidney function studies: Secondary | ICD-10-CM

## 2018-06-02 DIAGNOSIS — R509 Fever, unspecified: Secondary | ICD-10-CM | POA: Diagnosis not present

## 2018-06-02 LAB — BASIC METABOLIC PANEL
BUN: 14 mg/dL (ref 6–23)
CO2: 25 mEq/L (ref 19–32)
Calcium: 9.6 mg/dL (ref 8.4–10.5)
Chloride: 106 mEq/L (ref 96–112)
Creatinine, Ser: 0.97 mg/dL (ref 0.40–1.20)
GFR: 57.02 mL/min — ABNORMAL LOW (ref >60.00)
Glucose, Bld: 108 mg/dL — ABNORMAL HIGH (ref 70–99)
Potassium: 3.6 mEq/L (ref 3.5–5.1)
Sodium: 140 mEq/L (ref 135–145)

## 2018-06-02 MED ORDER — CEFAZOLIN SODIUM-DEXTROSE 2-4 GM/100ML-% IV SOLN: 2.0000 g | INTRAVENOUS | Status: DC

## 2018-06-03 ENCOUNTER — Encounter: Payer: Self-pay | Admitting: Anesthesiology

## 2018-06-03 ENCOUNTER — Encounter
Admission: RE | Disposition: A | Discharge status: Home / Self Care | Payer: Self-pay | Source: Home / Self Care | Attending: Surgery

## 2018-06-03 ENCOUNTER — Ambulatory Visit
Admission: RE | Admit: 2018-06-03 | Discharge: 2018-06-03 | Disposition: A | Discharge status: Home / Self Care | Payer: PPO | Attending: Surgery | Admitting: Surgery

## 2018-06-03 SURGERY — BIOPSY TEMPORAL ARTERY
Anesthesia: Monitor Anesthesia Care

## 2018-06-03 NOTE — Anesthesia Preprocedure Evaluation (Deleted)
Anesthesia Evaluation Anesthesia Physical Anesthesia Plan Anesthesia Quick Evaluation  

## 2018-06-09 ENCOUNTER — Ambulatory Visit: Payer: PPO | Admitting: Physician Assistant

## 2018-06-13 NOTE — Progress Notes (Deleted)
Cardiology Office Note Date:  06/13/2018  Patient ID:  Hale, Debra [birth year], MRN [id number] PCP:  [name], MD  Cardiologist:  Dr. Fletcher Anon, MD  ***refresh   Chief Complaint: Follow up  History of Present Illness: Debra Hale is a 68 y.o. female with history of coronary artery calcifications noted on CT scanning in 2015, recurrent pericarditis with documented small pericardial effusion which resolved with medical therapy, colon cancer status post laparoscopic right colectomy and chemoradiation, right sided breast cancer status post lumpectomy and radiation, thyroid cancer status post thyroidectomy and radiation therapy with postsurgical hypothyroidism, hyperlipidemia, palpitations in the setting of hypokalemia and hypomagnesemia, recently diagnosed temporal arteritis in 05/2018, osteoporosis, liver nodule with negative biopsy, and peripheral neuropathy who presents for follow up of chest pain.  Patient was seen in 2015 with pericarditis echocardiogram in 05/2013 showed an EF of 55 to 60%, normal wall motion, grade 1 diastolic dysfunction, trivial MR and TR, PASP normal, trivial pericardial effusion was identified. Sed rate at that time was noted to be elevated at 73. She was treated with NSAIDs/colchicine with subsequent symptom improvement. In 04/2016 she was seen for atypical chest pain and underwent exercise tolerance test which was unrevealing outside of poor exercise tolerance and a hypertensive response to exercise. The patient was only able to exercise for 2 minutes and 40 seconds. Max blood pressure was 194/88. She achieved 4.6 METs.She was most recently seen in the office in 05/2016 for follow-up of her pericarditis and was doing well. Follow-up echo at that time showed an EF of 55 to 60%, no regional wall motion normalities, grade 1 diastolic dysfunction, PASP normal. No pericardial effusion was identified.  Throughout the months of November and December 2019, the  patient had been dealing with chronic URI symptoms as well as bronchitis and pneumonia. In that setting, she developed chest pain that was sometimes substernal, sometimes on the right side, and sometimes along the left side. It was typically worse first thing in the morning and improved throughout the day. The pain did not wake her up from sleep, and was not exertional. She was seen in the ED on 12/19/2019for evaluation of her chest pain with chest x-ray showinga small left pleural effusion. CTA of the chest was negative for acute PE with a moderate left pleural effusion with underlying opacities favored to represent atelectasis. A small to moderate pericardial effusion was noted.Troponin negative x1. Potassium 3.3, serum creatinine 0.97. Patient was feeling well and the ED and wished to be discharged home. She was discharged with outpatient follow-up.She followed up with PCP on 12/30 and continued to note continued myalgias as well as headache. MRI of the brain was unrevealing. Evaluation by ENT and ophthalmology has been unrevealing. She was been referred to pulmonology for her left sided pleural effusion. In follow up with cardiology on 05/09/2018 there was concern she was dealing with pericarditis given her history. Sed rate was elevated at 70. She preferred to treat with NSAIDs and was not started on colchicine. Echo on 05/13/2018 showed an EF of 50-55%, mild concentric LVH, no RWMA, Gr1DD, no pericardial effusion. She saw her PCP in follow up on 05/19/2018 with continued chest pain that was worse with palpation and deep inspiration. She continued to have a headache. Recheck sed rate on 05/19/2018 remained elevated at 67. TSH had normalized. WBC normal at 6.1. SCr 1.23 with a baseline ~ 1.1.  She was last seen in follow up on 05/23/2018 and noted continuation of chest pain  that would improve with ibuprofen. She was placed on colchicine (she had previously declined this therapy). She has since been seen  by rheumatology and been diagnosed with presumed temporal arteritis with referral to surgery for biopsy. These results are not available.   Labs: 05/2018 - SCr 0.97, K+ 3.6  ***  Past Medical History:  Diagnosis Date  . Anxiety   . Arthritis    Osteoarthritis  . Breast cancer ([place]) 2009   right breast lumpectomy with rad tx  . Colon cancer ([place])    surgery with chemo and rad tx  . GERD (gastroesophageal reflux disease)   . Hypothyroidism   . Liver disease   . Liver nodule    s/p negative biopsy  . Malignant neoplasm of thyroid gland ([place]) 2002   s/p surgery and XRT  . Nephrolithiasis    s/p lithotripsy  . Osteoporosis   . Other and unspecified hyperlipidemia   . Palpitations   . Personal history of chemotherapy   . Personal history of malignant neoplasm of large intestine    carcinoma - cecum, s/p right laparoscopic colectomy - s/p chemotherapy and XRT  . Personal history of radiation therapy   . Pure hypercholesterolemia   . Unspecified hereditary and idiopathic peripheral neuropathy     Past Surgical History:  Procedure Laterality Date  . APPENDECTOMY  1985  . BREAST BIOPSY Right 2009   positive  . BREAST BIOPSY Right 2009   negative  . BREAST LUMPECTOMY Right 2009   breast cancer  . CHOLECYSTECTOMY  1995  . COLONOSCOPY    . COLONOSCOPY WITH PROPOFOL N/A 10/29/2017   Procedure: COLONOSCOPY WITH PROPOFOL;  Surgeon: Manya Silvas, MD;  Location: [facility] ENDOSCOPY;  Service: Endoscopy;  Laterality: N/A;  . DILATION AND CURETTAGE OF UTERUS  1990  . DILATION AND CURETTAGE, DIAGNOSTIC / THERAPEUTIC  1990  . ESOPHAGOGASTRODUODENOSCOPY    . ESOPHAGOGASTRODUODENOSCOPY (EGD) WITH PROPOFOL N/A 10/29/2017   Procedure: ESOPHAGOGASTRODUODENOSCOPY (EGD) WITH PROPOFOL;  Surgeon: Manya Silvas, MD;  Location: [facility] ENDOSCOPY;  Service: Endoscopy;  Laterality: N/A;  . LAPAROSCOPIC PARTIAL COLECTOMY     stage 3-C carcinoma of the cecum, s/p chemotherapy and xrt  . LITHOTRIPSY     . SIGMOIDOSCOPY  08/26/1993  . THYROID LOBECTOMY  2002   s/p XRT    No outpatient medications have been marked as taking for the 06/14/18 encounter (Appointment) with Rise Mu, PA-C.   Current Facility-Administered Medications for the 06/14/18 encounter (Appointment) with Rise Mu, PA-C  Medication  . ondansetron (ZOFRAN) tablet 8 mg    Allergies:   Demeclocycline; Tetracyclines & related; Augmentin [amoxicillin-pot clavulanate]; Bentyl [dicyclomine hcl]; Ciprofloxacin; Codeine; Epinephrine; Flagyl [metronidazole]; Librax [chlordiazepoxide-clidinium]; Phenobarbital; and Ultram [tramadol]   Social History:  The patient  reports that she has never smoked. She has never used smokeless tobacco. She reports that she does not drink alcohol or use drugs.   Family History:  The patient's family history includes Alzheimer's disease in her mother; Breast cancer in her maternal aunt and sister; Cancer in her father and mother; Colon cancer in her father; Lung cancer in her father and sister; Prostate cancer in her father; Stroke in her mother.  ROS:   ROS   PHYSICAL EXAM: *** VS:  There were no vitals taken for this visit. BMI: There is no height or weight on file to calculate BMI.  Physical Exam   EKG:  Was ordered and interpreted by me today. Shows ***  Recent Labs: 05/19/2018: ALT  13; Hemoglobin 12.7; Platelets 210.0; TSH 2.90 06/02/2018: BUN 14; Creatinine, Ser 0.97; Potassium 3.6; Sodium 140  09/23/2017: Cholesterol 211; HDL 47.80; LDL Cholesterol 129; Total CHOL/HDL Ratio 4; Triglycerides 167.0; VLDL 33.4   CrCl cannot be calculated (Unknown ideal weight.).   Wt Readings from Last 3 Encounters:  05/23/18 140 lb (63.5 kg)  05/23/18 140 lb 9.6 oz (63.8 kg)  05/19/18 143 lb 6.4 oz (65 kg)     Other studies reviewed: Additional studies/records reviewed today include: summarized above  ASSESSMENT AND PLAN:  1. ***  Disposition: F/u with Dr. Fletcher Anon or an APP in  ***  Current medicines are reviewed at length with the patient today.  The patient did not have any concerns regarding medicines.  Signed, Christell Faith, PA-C 06/13/2018 7:20 AM     [facility] [address] [place] [address] [place], [place] [address] [phone]

## 2018-06-14 ENCOUNTER — Ambulatory Visit: Payer: PPO | Admitting: Physician Assistant

## 2018-06-16 DIAGNOSIS — R700 Elevated erythrocyte sedimentation rate: Secondary | ICD-10-CM | POA: Diagnosis not present

## 2018-06-16 DIAGNOSIS — R634 Abnormal weight loss: Secondary | ICD-10-CM | POA: Diagnosis not present

## 2018-06-16 DIAGNOSIS — R51 Headache: Secondary | ICD-10-CM | POA: Diagnosis not present

## 2018-06-16 DIAGNOSIS — R5382 Chronic fatigue, unspecified: Secondary | ICD-10-CM | POA: Diagnosis not present

## 2018-06-21 ENCOUNTER — Ambulatory Visit (INDEPENDENT_AMBULATORY_CARE_PROVIDER_SITE_OTHER): Payer: PPO | Admitting: Internal Medicine

## 2018-06-21 ENCOUNTER — Encounter: Payer: Self-pay | Admitting: Internal Medicine

## 2018-06-21 DIAGNOSIS — K219 Gastro-esophageal reflux disease without esophagitis: Secondary | ICD-10-CM | POA: Diagnosis not present

## 2018-06-21 DIAGNOSIS — E559 Vitamin D deficiency, unspecified: Secondary | ICD-10-CM | POA: Diagnosis not present

## 2018-06-21 DIAGNOSIS — R945 Abnormal results of liver function studies: Secondary | ICD-10-CM

## 2018-06-21 DIAGNOSIS — F320 Major depressive disorder, single episode, mild: Secondary | ICD-10-CM

## 2018-06-21 DIAGNOSIS — J189 Pneumonia, unspecified organism: Secondary | ICD-10-CM

## 2018-06-21 DIAGNOSIS — F439 Reaction to severe stress, unspecified: Secondary | ICD-10-CM | POA: Diagnosis not present

## 2018-06-21 DIAGNOSIS — R51 Headache: Secondary | ICD-10-CM | POA: Diagnosis not present

## 2018-06-21 DIAGNOSIS — H539 Unspecified visual disturbance: Secondary | ICD-10-CM

## 2018-06-21 DIAGNOSIS — E7800 Pure hypercholesterolemia, unspecified: Secondary | ICD-10-CM | POA: Diagnosis not present

## 2018-06-21 DIAGNOSIS — Z853 Personal history of malignant neoplasm of breast: Secondary | ICD-10-CM | POA: Diagnosis not present

## 2018-06-21 DIAGNOSIS — F419 Anxiety disorder, unspecified: Secondary | ICD-10-CM

## 2018-06-21 DIAGNOSIS — F32A Depression, unspecified: Secondary | ICD-10-CM

## 2018-06-21 DIAGNOSIS — E039 Hypothyroidism, unspecified: Secondary | ICD-10-CM

## 2018-06-21 DIAGNOSIS — R7989 Other specified abnormal findings of blood chemistry: Secondary | ICD-10-CM

## 2018-06-21 DIAGNOSIS — J918 Pleural effusion in other conditions classified elsewhere: Secondary | ICD-10-CM

## 2018-06-21 DIAGNOSIS — R519 Headache, unspecified: Secondary | ICD-10-CM

## 2018-06-21 NOTE — Progress Notes (Signed)
Patient ID: KHRISTINA JANOTA, female   DOB: 12-22-1950, 68 y.o.   MRN: [id number]   Subjective:    Patient ID: MEHR DEPAOLI, female    DOB: [birth year], 68 y.o.   MRN: [id number]  HPI  Patient here for a scheduled follow up.  Was recently evaluated by rheumatology.  Had referred her for temporal artery biopsy.  She declined to have the biopsy.  Was initially placed on prednisone.  Stopped secondary to intolerance.  Increased anxiety related to the prednisone.  Reports her sister has been staying with her.  She reports she feels better, but still having increased anxiety.  Discussed with her today.  She plans to f/u with her psychiatrist.  She declines to have biopsy and declines prednisone therapy.  Discussed temporal arteritis.  Discussed the need to determine diagnosis and the need for treatment and possible risk if untreated.  She continues to decline.  States she feels better.  No head pain. No chest or shoulder pain.  Eating.  No nausea or vomiting.  Bowels overall stable now.  She is concerned about her vision.  No vision loss, but does describe noticing that she cannot see as well.  Discussed f/u with ophthalmology.     Past Medical History:  Diagnosis Date  . Anxiety   . Arthritis    Osteoarthritis  . Breast cancer ([place]) 2009   right breast lumpectomy with rad tx  . Colon cancer ([place])    surgery with chemo and rad tx  . GERD (gastroesophageal reflux disease)   . Hypothyroidism   . Liver disease   . Liver nodule    s/p negative biopsy  . Malignant neoplasm of thyroid gland ([place]) 2002   s/p surgery and XRT  . Nephrolithiasis    s/p lithotripsy  . Osteoporosis   . Other and unspecified hyperlipidemia   . Palpitations   . Personal history of chemotherapy   . Personal history of malignant neoplasm of large intestine    carcinoma - cecum, s/p right laparoscopic colectomy - s/p chemotherapy and XRT  . Personal history of radiation therapy   . Pure hypercholesterolemia   .  Unspecified hereditary and idiopathic peripheral neuropathy    Past Surgical History:  Procedure Laterality Date  . APPENDECTOMY  1985  . BREAST BIOPSY Right 2009   positive  . BREAST BIOPSY Right 2009   negative  . BREAST LUMPECTOMY Right 2009   breast cancer  . CHOLECYSTECTOMY  1995  . COLONOSCOPY    . COLONOSCOPY WITH PROPOFOL N/A 10/29/2017   Procedure: COLONOSCOPY WITH PROPOFOL;  Surgeon: Manya Silvas, MD;  Location: [facility] ENDOSCOPY;  Service: Endoscopy;  Laterality: N/A;  . DILATION AND CURETTAGE OF UTERUS  1990  . DILATION AND CURETTAGE, DIAGNOSTIC / THERAPEUTIC  1990  . ESOPHAGOGASTRODUODENOSCOPY    . ESOPHAGOGASTRODUODENOSCOPY (EGD) WITH PROPOFOL N/A 10/29/2017   Procedure: ESOPHAGOGASTRODUODENOSCOPY (EGD) WITH PROPOFOL;  Surgeon: Manya Silvas, MD;  Location: [facility] ENDOSCOPY;  Service: Endoscopy;  Laterality: N/A;  . LAPAROSCOPIC PARTIAL COLECTOMY     stage 3-C carcinoma of the cecum, s/p chemotherapy and xrt  . LITHOTRIPSY    . SIGMOIDOSCOPY  08/26/1993  . THYROID LOBECTOMY  2002   s/p XRT   Family History  Problem Relation Age of Onset  . Stroke Mother        21s  . Alzheimer's disease Mother   . Cancer Mother   . Lung cancer Father   . Prostate cancer Father   . Cancer  Father        Colon  . Colon cancer Father   . Breast cancer Sister        71's  . Lung cancer Sister   . Breast cancer Maternal Aunt    Social History   Socioeconomic History  . Marital status: Divorced    Spouse name: Not on file  . Number of children: 0  . Years of education: Not on file  . Highest education level: Not on file  Occupational History  . Not on file  Social Needs  . Financial resource strain: Not on file  . Food insecurity:    Worry: Not on file    Inability: Not on file  . Transportation needs:    Medical: Not on file    Non-medical: Not on file  Tobacco Use  . Smoking status: Never Smoker  . Smokeless tobacco: Never Used  Substance and Sexual Activity    . Alcohol use: No    Alcohol/week: 0.0 standard drinks  . Drug use: No  . Sexual activity: Not Currently  Lifestyle  . Physical activity:    Days per week: Not on file    Minutes per session: Not on file  . Stress: Not on file  Relationships  . Social connections:    Talks on phone: Not on file    Gets together: Not on file    Attends religious service: Not on file    Active member of club or organization: Not on file    Attends meetings of clubs or organizations: Not on file    Relationship status: Not on file  Other Topics Concern  . Not on file  Social History Narrative  . Not on file    Outpatient Encounter Medications as of 06/21/2018  Medication Sig  . albuterol (PROVENTIL HFA;VENTOLIN HFA) 108 (90 Base) MCG/ACT inhaler Inhale 2 puffs into the lungs every 6 (six) hours as needed for wheezing or shortness of breath.  . Azelastine-Fluticasone (DYMISTA) 137-50 MCG/ACT SUSP One spray in each nostril bid  . clonazePAM (KLONOPIN) 0.5 MG tablet Take 0.5 mg by mouth 2 (two) times daily.  . colchicine 0.6 MG tablet Take 1 tablet (0.6 mg total) by mouth 2 (two) times daily.  . colestipol (COLESTID) 1 g tablet TAKE 1 TABLET (1 G TOTAL) BY MOUTH ONCE DAILY MAY INCREASE DOSE TO TWICE DAILY IF NEEDED.  . cyanocobalamin (,VITAMIN B-12,) 1000 MCG/ML injection 1000 mcg (1 mg) injection once per week for four weeks, followed by 1000 mcg injection once per month.  . ergocalciferol (VITAMIN D2) 50000 units capsule Take 1 capsule (50,000 Units total) by mouth once a week.  . escitalopram (LEXAPRO) 20 MG tablet Take 20 mg by mouth daily.    Marland Kitchen ibuprofen (ADVIL,MOTRIN) 600 MG tablet Take 1 tablet (600 mg total) by mouth every 6 (six) hours as needed.  Marland Kitchen levothyroxine (SYNTHROID, LEVOTHROID) 100 MCG tablet Take 1 tablet (100 mcg total) by mouth daily.  Marland Kitchen loperamide (IMODIUM) 1 MG/5ML solution Take 1 mg by mouth as needed for diarrhea or loose stools.  . magnesium oxide (MAG-OX) 400 MG tablet TAKE 1  TABLET BY MOUTH EVERY DAY  . nystatin (MYCOSTATIN) 100000 UNIT/ML suspension Take 5 mLs (500,000 Units total) by mouth 3 (three) times daily. Swish and spit  . ondansetron (ZOFRAN ODT) 4 MG disintegrating tablet Take 1 tablet (4 mg total) by mouth every 8 (eight) hours as needed for nausea or vomiting.  . ondansetron (ZOFRAN) 4 MG tablet Take  1 tablet (4 mg total) by mouth 3 (three) times daily as needed for nausea or vomiting.  . pantoprazole (PROTONIX) 40 MG tablet TAKE 1 TABLET BY MOUTH EVERY DAY  . potassium chloride (KLOR-CON 10) 10 MEQ tablet Take 1 tablet (10 mEq total) by mouth daily.   Facility-Administered Encounter Medications as of 06/21/2018  Medication  . ondansetron (ZOFRAN) tablet 8 mg    Review of Systems  Constitutional: Negative for appetite change and unexpected weight change.  HENT: Negative for congestion and sinus pressure.   Respiratory: Negative for cough, chest tightness and shortness of breath.   Cardiovascular: Negative for chest pain, palpitations and leg swelling.  Gastrointestinal: Negative for abdominal pain, nausea and vomiting.  Genitourinary: Negative for difficulty urinating and dysuria.  Musculoskeletal: Negative for myalgias.       No shoulder pain.  No chest soreness.    Skin: Negative for color change and rash.  Neurological: Negative for dizziness, light-headedness and headaches.  Psychiatric/Behavioral: Negative for dysphoric mood.       Increased stress and anxiety as outlined.         Objective:    Physical Exam Constitutional:      General: She is not in acute distress.    Appearance: Normal appearance.  HENT:     Nose: Nose normal. No congestion.     Mouth/Throat:     Pharynx: No oropharyngeal exudate or posterior oropharyngeal erythema.  Neck:     Musculoskeletal: Neck supple. No muscular tenderness.     Thyroid: No thyromegaly.  Cardiovascular:     Rate and Rhythm: Normal rate and regular rhythm.  Pulmonary:     Effort: No  respiratory distress.     Breath sounds: Normal breath sounds. No wheezing.  Abdominal:     General: Bowel sounds are normal.     Palpations: Abdomen is soft.     Tenderness: There is no abdominal tenderness.  Musculoskeletal:        General: No swelling or tenderness.  Lymphadenopathy:     Cervical: No cervical adenopathy.  Skin:    Findings: No erythema or rash.  Neurological:     Mental Status: She is alert.  Psychiatric:        Mood and Affect: Mood normal.        Behavior: Behavior normal.     BP 110/60   Pulse 95   Temp 98.7 F (37.1 C) (Oral)   Wt 134 lb (60.8 kg)   SpO2 98%   BMI 23.00 kg/m  Wt Readings from Last 3 Encounters:  06/21/18 134 lb (60.8 kg)  05/23/18 140 lb (63.5 kg)  05/23/18 140 lb 9.6 oz (63.8 kg)     Lab Results  Component Value Date   WBC 6.1 05/19/2018   HGB 12.7 05/19/2018   HCT 36.9 05/19/2018   PLT 210.0 05/19/2018   GLUCOSE 108 (H) 06/02/2018   CHOL 211 (H) 09/23/2017   TRIG 167.0 (H) 09/23/2017   HDL 47.80 09/23/2017   LDLDIRECT 126.0 07/11/2015   LDLCALC 129 (H) 09/23/2017   ALT 13 05/19/2018   AST 9 05/19/2018   NA 140 06/02/2018   K 3.6 06/02/2018   CL 106 06/02/2018   CREATININE 0.97 06/02/2018   BUN 14 06/02/2018   CO2 25 06/02/2018   TSH 2.90 05/19/2018   HGBA1C 5.3 12/10/2016       Assessment & Plan:   Problem List Items Addressed This Visit    Abnormal liver function test  Previously worked up by GI.  Previous CT - no liver mass.  Follow liver function tests.        Anxiety    Has been followed by psychiatry.  Increased anxiety as outlined. Plans to discuss with psychiatry.        Change in vision    Decrease in vision as outlined.  Refer back to ophthalmology.        Relevant Orders   Ambulatory referral to Ophthalmology   GERD (gastroesophageal reflux disease)    Controlled on current regimen.  Follow.        History of breast cancer    Followed by oncology.  Mammogram 10/12/17 - Birads I.         Hypercholesterolemia    Low cholesterol diet and exercise.  Follow lipid panel.       Hypothyroidism    On thyroid replacement.  Follow tsh.        Mild depression ([place])    Continue f/u with psychiatry.       Nonintractable headache    No headache now.  Took prednisone for a short time.  Intolerance as outlined.  Declines to restart.  Declines biopsy.  Head pain resolved.  Follow.        Pleural effusion associated with pulmonary infection    Previous infection.  Resolved.  Saw pulmonary.  No chest pain.  Breathing stable.        Stress    Increased stress and anxiety as outlined.  Discussed with her today.  Relates to prednisone.  Sister staying with her.  She is doing some better.  Plans to f/u with her psychiatry.        Vitamin D deficiency    Follow vitamin D level.           [name], MD

## 2018-06-24 ENCOUNTER — Encounter: Payer: Self-pay | Admitting: Internal Medicine

## 2018-06-24 DIAGNOSIS — H539 Unspecified visual disturbance: Secondary | ICD-10-CM | POA: Insufficient documentation

## 2018-06-24 NOTE — Assessment & Plan Note (Signed)
Previously worked up by GI.  Previous CT - no liver mass.  Follow liver function tests.   

## 2018-06-24 NOTE — Assessment & Plan Note (Signed)
Increased stress and anxiety as outlined.  Discussed with her today.  Relates to prednisone.  Sister staying with her.  She is doing some better.  Plans to f/u with her psychiatry.

## 2018-06-24 NOTE — Assessment & Plan Note (Signed)
Previous infection.  Resolved.  Saw pulmonary.  No chest pain.  Breathing stable.

## 2018-06-24 NOTE — Assessment & Plan Note (Signed)
Continue f/u with psychiatry.   

## 2018-06-24 NOTE — Assessment & Plan Note (Signed)
Controlled on current regimen.  Follow.  

## 2018-06-24 NOTE — Assessment & Plan Note (Signed)
No headache now.  Took prednisone for a short time.  Intolerance as outlined.  Declines to restart.  Declines biopsy.  Head pain resolved.  Follow.

## 2018-06-24 NOTE — Assessment & Plan Note (Signed)
Decrease in vision as outlined.  Refer back to ophthalmology.

## 2018-06-24 NOTE — Assessment & Plan Note (Signed)
Low cholesterol diet and exercise.  Follow lipid panel.   

## 2018-06-24 NOTE — Assessment & Plan Note (Signed)
Followed by oncology.  Mammogram 10/12/17 - Birads I.

## 2018-06-24 NOTE — Assessment & Plan Note (Signed)
On thyroid replacement.  Follow tsh.  

## 2018-06-24 NOTE — Assessment & Plan Note (Signed)
Follow vitamin D level.  

## 2018-06-24 NOTE — Assessment & Plan Note (Signed)
Has been followed by psychiatry.  Increased anxiety as outlined. Plans to discuss with psychiatry.

## 2018-07-06 ENCOUNTER — Ambulatory Visit: Payer: Self-pay | Admitting: *Deleted

## 2018-07-06 NOTE — Telephone Encounter (Signed)
Agree with need for evaluation.  She sees psychiatry regularly and they prescribe her psych medications.  Confirm no suicidal ideations, etc.  Saw her recently for increased anxiety.  She had informed me she would discuss with psych.  Needs evaluation and f/u with psych.

## 2018-07-06 NOTE — Telephone Encounter (Signed)
Spoke with patient. Advised that PCP is not in the office this PM and we do not do BP checks. Pt denies any symptoms that would indicate that her BP is high. She has no flushing of the face, no head ache, no blurred vision. Patient says that for the last two weeks she has been extremely anxious. She has not been able to leave the house, she has not been able to drive a car. Says she is just nervous. She does take klonopin 0.5 mg q day. She says that she feels it is not helping. She is refusing to go to [facility] or ED. Refused to speak with therapist. She is wanting something to help her get calmed down. She says that if she could just calm down she thinks she would be okay. Advised that I would try to speak with a provider and see what I could do but she really needs to be seen.

## 2018-07-06 NOTE — Telephone Encounter (Signed)
Advised patient that she should call pysch and f/u with them regarding this and see what they say. Patient agreed to comply.

## 2018-07-06 NOTE — Telephone Encounter (Signed)
Pt reports increased anxiety x 2 weeks. No recent triggers, "Just bad today." States she has little support.  Reports difficulty sleeping, difficulty concentrating. Denies any suicidal or homicidal ideation. States can not go about routine ADLs. . States "I just need reassurance, someone to check my BP." Questioned if she though her BP was high, any other symptoms, states "I don't know what that feels like." Denies headache. States "Breathing fast at times because I'm anxious." Denies SOB. On Klonopin 0.5mg , states took dose this AM. States does have therapist but "I won't talk to him. He told me this wouldn't happen."  No availability at practice, recommended UC, adamantly  declined. TN called practice and spoke to Butch Penny who reiterated UC disposition, pt declines. Per Donna's instruction will route to practice for Dr. Bary Leriche nurse to review. Pt states, "It will take 2 minutes to reassure me." TN offered Behavioral Health number as resource, declined. Instructed to CB if symptoms worsen.  Please advise: [phone]                         Cell [phone]  Reason for Disposition . Symptoms interfere with work or school  Answer Assessment - Initial Assessment Questions 1. CONCERN: "What happened that made you call today?"     2 weeks anxiety increased 2. ANXIETY SYMPTOM SCREENING: "Can you describe how you have been feeling?"  (e.g., tense, restless, panicky, anxious, keyed up, trouble sleeping, trouble concentrating)     Trouble sleeping, trouble focusing 3. ONSET: "How long have you been feeling this way?"     2 weeks 4. RECURRENT: "Have you felt this way before?"  If yes: "What happened that time?" "What helped these feelings go away in the past?"      yes 5. RISK OF HARM - SUICIDAL IDEATION:  "Do you ever have thoughts of hurting or killing yourself?"  (e.g., yes, no, no but preoccupation with thoughts about death)   - INTENT:  "Do you have thoughts of hurting or killing yourself right NOW?"  (e.g., yes, no, N/A)   - PLAN: "Do you have a specific plan for how you would do this?" (e.g., gun, knife, overdose, no plan, N/A)     no 6. RISK OF HARM - HOMICIDAL IDEATION:  "Do you ever have thoughts of hurting or killing someone else?"  (e.g., yes, no, no but preoccupation with thoughts about death)   - INTENT:  "Do you have thoughts of hurting or killing someone right NOW?" (e.g., yes, no, N/A)   - PLAN: "Do you have a specific plan for how you would do this?" (e.g., gun, knife, no plan, N/A)      no 7. FUNCTIONAL IMPAIRMENT: "How have things been going for you overall in your life? Have you had any more difficulties than usual doing your normal daily activities?"  (e.g., better, same, worse; self-care, school, work, interactions)     Not really 8. SUPPORT: "Who is with you now?" "Who do you live with?" "Do you have family or friends nearby who you can talk to?"      No support 9. THERAPIST: "Do you have a counselor or therapist? Name?"     Yes 10. STRESSORS: "Has there been any new stress or recent changes in your life?"       no 11. CAFFEINE ABUSE: "Do you drink caffeinated beverages, and how much each day?" (e.g., coffee, tea, colas)       no 12. SUBSTANCE ABUSE: "Do you  use any illegal drugs or alcohol?"       no 13. OTHER SYMPTOMS: "Do you have any other physical symptoms right now?" (e.g., chest pain, palpitations, difficulty breathing, fever)       Breathing fast at times , SOB  Protocols used: ANXIETY AND PANIC ATTACK-A-AH

## 2018-07-14 ENCOUNTER — Telehealth: Payer: Self-pay

## 2018-07-14 ENCOUNTER — Ambulatory Visit: Payer: PPO | Admitting: Internal Medicine

## 2018-07-14 NOTE — Telephone Encounter (Signed)
Copied from [place] [phone]. Topic: General - Other >> Jul 14, 2018  4:11 PM Sheran Luz wrote: Reason for CRM: Pt just calling to state she missed appointment today due to diarrhea.

## 2018-07-15 NOTE — Telephone Encounter (Signed)
Noted  

## 2018-08-02 ENCOUNTER — Telehealth: Payer: Self-pay

## 2018-08-02 NOTE — Telephone Encounter (Signed)
Left voicemail message requesting for patient to call back to reschedule face to face visit with Ignacia Bayley, NP on 08/08/2018 to a telephone or video visit due to current clinic policies related to the [place] virus precautions.

## 2018-08-03 NOTE — Telephone Encounter (Signed)
I spoke with the patient. She states she is doing well at this time and denies any current cardiac symptoms.   I have advised her of Dr. Tyrell Antonio recommendations for follow up.  She is agreeable with the time frame.  She is scheduled to see him on Tuesday 09/27/18 at 3:40 pm.   She is aware that we will have to re-evaluate the current state of COVID-19 at that time.  She voices understanding.

## 2018-08-03 NOTE — Telephone Encounter (Signed)
Patient returning call  States that she is not interested in doing a video or phone visit - only wants to see in office Patient also does not wish to see Angelica Ran, would prefer Dr Fletcher Anon or R Dunn Please call to detemine if appointment is ok to wait til after COVID 19 restrictions are removed

## 2018-08-03 NOTE — Telephone Encounter (Signed)
Dr. Fletcher Anon,   Please review- just wanted to make sure this isn't someone you felt like needed to go ahead and come in if since she is declining an e-visit of any kind.  This was a follow up for her pericarditis- last saw Thurmond Butts in January.   Thank you!

## 2018-08-03 NOTE — Telephone Encounter (Signed)
If she is feeling better, she can come for an office visit in 1 to 2 months.

## 2018-08-08 ENCOUNTER — Encounter

## 2018-08-08 ENCOUNTER — Ambulatory Visit: Payer: PPO | Admitting: Nurse Practitioner

## 2018-08-09 ENCOUNTER — Ambulatory Visit: Payer: PPO | Admitting: Internal Medicine

## 2018-08-19 ENCOUNTER — Ambulatory Visit (INDEPENDENT_AMBULATORY_CARE_PROVIDER_SITE_OTHER): Payer: PPO | Admitting: Internal Medicine

## 2018-08-19 ENCOUNTER — Encounter: Payer: Self-pay | Admitting: Internal Medicine

## 2018-08-19 ENCOUNTER — Other Ambulatory Visit: Payer: Self-pay

## 2018-08-19 DIAGNOSIS — F419 Anxiety disorder, unspecified: Secondary | ICD-10-CM | POA: Diagnosis not present

## 2018-08-19 DIAGNOSIS — F320 Major depressive disorder, single episode, mild: Secondary | ICD-10-CM

## 2018-08-19 DIAGNOSIS — E7800 Pure hypercholesterolemia, unspecified: Secondary | ICD-10-CM

## 2018-08-19 DIAGNOSIS — Z9109 Other allergy status, other than to drugs and biological substances: Secondary | ICD-10-CM

## 2018-08-19 DIAGNOSIS — I309 Acute pericarditis, unspecified: Secondary | ICD-10-CM

## 2018-08-19 DIAGNOSIS — E039 Hypothyroidism, unspecified: Secondary | ICD-10-CM

## 2018-08-19 DIAGNOSIS — R7989 Other specified abnormal findings of blood chemistry: Secondary | ICD-10-CM

## 2018-08-19 DIAGNOSIS — J189 Pneumonia, unspecified organism: Secondary | ICD-10-CM

## 2018-08-19 DIAGNOSIS — R945 Abnormal results of liver function studies: Secondary | ICD-10-CM

## 2018-08-19 DIAGNOSIS — J918 Pleural effusion in other conditions classified elsewhere: Secondary | ICD-10-CM

## 2018-08-19 DIAGNOSIS — Z853 Personal history of malignant neoplasm of breast: Secondary | ICD-10-CM

## 2018-08-19 DIAGNOSIS — F439 Reaction to severe stress, unspecified: Secondary | ICD-10-CM

## 2018-08-19 DIAGNOSIS — F32A Depression, unspecified: Secondary | ICD-10-CM

## 2018-08-19 DIAGNOSIS — R197 Diarrhea, unspecified: Secondary | ICD-10-CM

## 2018-08-19 DIAGNOSIS — K219 Gastro-esophageal reflux disease without esophagitis: Secondary | ICD-10-CM | POA: Diagnosis not present

## 2018-08-19 DIAGNOSIS — D72819 Decreased white blood cell count, unspecified: Secondary | ICD-10-CM

## 2018-08-19 NOTE — Progress Notes (Addendum)
Patient ID: Debra Hale, female   DOB: 23-Feb-1951, 68 y.o.   MRN: [id number] Virtual Visit via Telephone: Note  This visit type was conducted due to national recommendations for restrictions regarding the COVID-19 pandemic (e.g. social distancing).  This format is felt to be most appropriate for this patient at this time.  All issues noted in this document were discussed and addressed.  No physical exam was performed (except for noted visual exam findings with Video Visits).   I connected with [name] on 08/19/18 at 10:30 AM EDT by telephone and verified that I am speaking with the correct person using two identifiers. Location patient: home Location provider: work Persons participating in the telephone visit: patient, provider  I discussed the limitations, risks, security and privacy concerns of performing an evaluation and management service by telephone and the availability of in person appointments.  The patient expressed understanding and agreed to proceed.   Reason for visit: scheduled follow up.   HPI: She reports she is doing better.  Seeing Dr Clovis Riley.  He is adjusting her medication.  On clonazepam 1.5 tablets bid.  Feels this is helping.  Continues on lexapro.  She is sleeping better.  Eating well.  No head pain. No headache.  No sinus issues.  No chest congestion or sob.  No chest pain.  Has f/u with Dr Fletcher Anon 09/27/18.  No acid reflux.  No abdominal pain.  Bowels moving. Bowels better.  On colestipol.  This has helped.   No urine change.  Had previous cxr.  Revealed pleural effusion.  Symptoms have cleared.  Will need f/u cxr.  Will obtain when COVID restrictions are lifted.     ROS: See pertinent positives and negatives per HPI.  Past Medical History:  Diagnosis Date  . Anxiety   . Arthritis    Osteoarthritis  . Breast cancer ([place]) 2009   right breast lumpectomy with rad tx  . Colon cancer ([place])    surgery with chemo and rad tx  . GERD (gastroesophageal reflux disease)    . Hypothyroidism   . Liver disease   . Liver nodule    s/p negative biopsy  . Malignant neoplasm of thyroid gland ([place]) 2002   s/p surgery and XRT  . Nephrolithiasis    s/p lithotripsy  . Osteoporosis   . Other and unspecified hyperlipidemia   . Palpitations   . Personal history of chemotherapy   . Personal history of malignant neoplasm of large intestine    carcinoma - cecum, s/p right laparoscopic colectomy - s/p chemotherapy and XRT  . Personal history of radiation therapy   . Pure hypercholesterolemia   . Unspecified hereditary and idiopathic peripheral neuropathy     Past Surgical History:  Procedure Laterality Date  . APPENDECTOMY  1985  . BREAST BIOPSY Right 2009   positive  . BREAST BIOPSY Right 2009   negative  . BREAST LUMPECTOMY Right 2009   breast cancer  . CHOLECYSTECTOMY  1995  . COLONOSCOPY    . COLONOSCOPY WITH PROPOFOL N/A 10/29/2017   Procedure: COLONOSCOPY WITH PROPOFOL;  Surgeon: Manya Silvas, MD;  Location: [facility] ENDOSCOPY;  Service: Endoscopy;  Laterality: N/A;  . DILATION AND CURETTAGE OF UTERUS  1990  . DILATION AND CURETTAGE, DIAGNOSTIC / THERAPEUTIC  1990  . ESOPHAGOGASTRODUODENOSCOPY    . ESOPHAGOGASTRODUODENOSCOPY (EGD) WITH PROPOFOL N/A 10/29/2017   Procedure: ESOPHAGOGASTRODUODENOSCOPY (EGD) WITH PROPOFOL;  Surgeon: Manya Silvas, MD;  Location: [facility] ENDOSCOPY;  Service: Endoscopy;  Laterality: N/A;  .  LAPAROSCOPIC PARTIAL COLECTOMY     stage 3-C carcinoma of the cecum, s/p chemotherapy and xrt  . LITHOTRIPSY    . SIGMOIDOSCOPY  08/26/1993  . THYROID LOBECTOMY  2002   s/p XRT    Family History  Problem Relation Age of Onset  . Stroke Mother        50s  . Alzheimer's disease Mother   . Cancer Mother   . Lung cancer Father   . Prostate cancer Father   . Cancer Father        Colon  . Colon cancer Father   . Breast cancer Sister        1's  . Lung cancer Sister   . Breast cancer Maternal Aunt     SOCIAL HX: reviewed.     Current Outpatient Medications:  .  albuterol (PROVENTIL HFA;VENTOLIN HFA) 108 (90 Base) MCG/ACT inhaler, Inhale 2 puffs into the lungs every 6 (six) hours as needed for wheezing or shortness of breath., Disp: 1 Inhaler, Rfl: 0 .  Azelastine-Fluticasone (DYMISTA) 137-50 MCG/ACT SUSP, One spray in each nostril bid, Disp: 23 g, Rfl: 2 .  clonazePAM (KLONOPIN) 0.5 MG tablet, Take 0.5 mg by mouth 2 (two) times daily., Disp: , Rfl: 0 .  colchicine 0.6 MG tablet, Take 1 tablet (0.6 mg total) by mouth 2 (two) times daily., Disp: 90 tablet, Rfl: 3 .  colestipol (COLESTID) 1 g tablet, TAKE 1 TABLET (1 G TOTAL) BY MOUTH ONCE DAILY MAY INCREASE DOSE TO TWICE DAILY IF NEEDED., Disp: , Rfl: 3 .  cyanocobalamin (,VITAMIN B-12,) 1000 MCG/ML injection, 1000 mcg (1 mg) injection once per week for four weeks, followed by 1000 mcg injection once per month., Disp: 4 mL, Rfl: 1 .  ergocalciferol (VITAMIN D2) 50000 units capsule, Take 1 capsule (50,000 Units total) by mouth once a week., Disp: 4 capsule, Rfl: 3 .  escitalopram (LEXAPRO) 20 MG tablet, Take 20 mg by mouth daily.  , Disp: , Rfl:  .  ibuprofen (ADVIL,MOTRIN) 600 MG tablet, Take 1 tablet (600 mg total) by mouth every 6 (six) hours as needed., Disp: 30 tablet, Rfl: 0 .  levothyroxine (SYNTHROID, LEVOTHROID) 100 MCG tablet, Take 1 tablet (100 mcg total) by mouth daily., Disp: 90 tablet, Rfl: 3 .  loperamide (IMODIUM) 1 MG/5ML solution, Take 1 mg by mouth as needed for diarrhea or loose stools., Disp: , Rfl:  .  magnesium oxide (MAG-OX) 400 MG tablet, TAKE 1 TABLET BY MOUTH EVERY DAY, Disp: 90 tablet, Rfl: 1 .  nystatin (MYCOSTATIN) 100000 UNIT/ML suspension, Take 5 mLs (500,000 Units total) by mouth 3 (three) times daily. Swish and spit, Disp: 60 mL, Rfl: 0 .  ondansetron (ZOFRAN ODT) 4 MG disintegrating tablet, Take 1 tablet (4 mg total) by mouth every 8 (eight) hours as needed for nausea or vomiting., Disp: 30 tablet, Rfl: 0 .  ondansetron (ZOFRAN) 4 MG  tablet, Take 1 tablet (4 mg total) by mouth 3 (three) times daily as needed for nausea or vomiting., Disp: 20 tablet, Rfl: 0 .  pantoprazole (PROTONIX) 40 MG tablet, TAKE 1 TABLET BY MOUTH EVERY DAY, Disp: 90 tablet, Rfl: 2 .  potassium chloride (KLOR-CON 10) 10 MEQ tablet, Take 1 tablet (10 mEq total) by mouth daily., Disp: 90 tablet, Rfl: 1  Current Facility-Administered Medications:  .  ondansetron (ZOFRAN) tablet 8 mg, 8 mg, Oral, Once, Guse, Jacquelynn Cree, FNP  EXAM:  GENERAL: alert.  Sounds to be in no acute distress.  Answering questions appropriately.  PSYCH/NEURO: pleasant and cooperative, no obvious depression or anxiety, speech and thought processing grossly intact  ASSESSMENT AND PLAN:  Discussed the following assessment and plan:  Abnormal liver function test  Acute diarrhea  Acute pericarditis, unspecified type  Anxiety  Environmental allergies  Gastroesophageal reflux disease, esophagitis presence not specified  History of breast cancer  Hypercholesterolemia  Hypothyroidism, unspecified type  Mild depression (HCC)  Pleural effusion associated with pulmonary infection  Stress  Leukopenia, unspecified type  Abnormal liver function test Previously worked up by GI.  Previous CT - no liver mass.  Follow liver function tests.    Acute diarrhea Bowels better.  Has seen GI.  Taking colestipol.  Doing well with this medication.    Acute pericarditis Evaluated by cardiology.  No pain now.  Has f/u planned in May.  ECHO with no pericardial effusion.    Anxiety Being followed by psychiatry.  Taking lexapro and clonazepam.  Dose being adjusted.  Doing better.  Continue f/u with Dr Clovis Riley.    Environmental allergies Controlled.    GERD (gastroesophageal reflux disease) Controlled on current regimen.    History of breast cancer Mammogram 10/12/17 - Birads I.   Hypercholesterolemia Low cholesterol diet and exercise.  Follow lipid panel.    Hypothyroidism On thyroid replacement.  Follow tsh.    Mild depression ([place]) Continue f/u with psychiatry.  On lexapro.  Adjusting dose of clonazepam.  Doing better.    Pleural effusion associated with pulmonary infection Pleural effusion noted on previous cxr.  Plan for f/u cxr when social restrictions lifted and she feels comfortable coming in for xray.   Stress Increased stress.  Followed by psychiatry.  Doing better.    Leukopenia Follow cbc.     I discussed the assessment and treatment plan with the patient. The patient was provided an opportunity to ask questions and all were answered. The patient agreed with the plan and demonstrated an understanding of the instructions.   The patient was advised to call back or seek an in-person evaluation if the symptoms worsen or if the condition fails to improve as anticipated.  I provided 20 minutes of non-face-to-face time during this encounter.   [name], MD

## 2018-08-21 ENCOUNTER — Encounter: Payer: Self-pay | Admitting: Internal Medicine

## 2018-08-21 DIAGNOSIS — D72819 Decreased white blood cell count, unspecified: Secondary | ICD-10-CM | POA: Insufficient documentation

## 2018-08-21 NOTE — Assessment & Plan Note (Signed)
Mammogram 10/12/17 - Birads I.

## 2018-08-21 NOTE — Assessment & Plan Note (Signed)
Controlled.  

## 2018-08-21 NOTE — Assessment & Plan Note (Signed)
Evaluated by cardiology.  No pain now.  Has f/u planned in May.  ECHO with no pericardial effusion.

## 2018-08-21 NOTE — Assessment & Plan Note (Signed)
Controlled on current regimen.   

## 2018-08-21 NOTE — Assessment & Plan Note (Signed)
Follow cbc.  

## 2018-08-21 NOTE — Assessment & Plan Note (Signed)
Increased stress.  Followed by psychiatry.  Doing better.

## 2018-08-21 NOTE — Assessment & Plan Note (Signed)
Previously worked up by GI.  Previous CT - no liver mass.  Follow liver function tests.

## 2018-08-21 NOTE — Assessment & Plan Note (Signed)
Bowels better.  Has seen GI.  Taking colestipol.  Doing well with this medication.

## 2018-08-21 NOTE — Assessment & Plan Note (Signed)
On thyroid replacement.  Follow tsh.  

## 2018-08-21 NOTE — Assessment & Plan Note (Signed)
Low cholesterol diet and exercise.  Follow lipid panel.   

## 2018-08-21 NOTE — Assessment & Plan Note (Signed)
Pleural effusion noted on previous cxr.  Plan for f/u cxr when social restrictions lifted and she feels comfortable coming in for xray.

## 2018-08-21 NOTE — Assessment & Plan Note (Signed)
Being followed by psychiatry.  Taking lexapro and clonazepam.  Dose being adjusted.  Doing better.  Continue f/u with Dr Clovis Riley.

## 2018-08-21 NOTE — Assessment & Plan Note (Signed)
Continue f/u with psychiatry.  On lexapro.  Adjusting dose of clonazepam.  Doing better.

## 2018-08-24 ENCOUNTER — Telehealth: Payer: Self-pay

## 2018-08-24 NOTE — Telephone Encounter (Signed)
-----   Message from [name], MD sent at 08/21/2018  2:24 PM EDT ----- Regarding: refill colestipol Please call [facility] and see if they plan on refilling colestipol for [name].  If a problem, let me know.  Also, let pt know if GI plans to refill.  Send message back after pt notified.    Thanks  Dr Nicki Reaper

## 2018-08-24 NOTE — Telephone Encounter (Signed)
Called GI, Debra Hale is out of office today. Left message for the nurse to call me back and let me know.

## 2018-08-24 NOTE — Telephone Encounter (Signed)
Lanelle Bal from [facility] called back There have been no changes in the plan She will be continuing the colestipol cb is [phone] Please cal back with any questions

## 2018-08-25 NOTE — Telephone Encounter (Signed)
Pt aware.

## 2018-08-25 NOTE — Telephone Encounter (Signed)
Per note, GI is going to continue

## 2018-09-27 ENCOUNTER — Encounter: Payer: Self-pay | Admitting: Cardiovascular Disease

## 2018-09-27 ENCOUNTER — Other Ambulatory Visit: Payer: Self-pay

## 2018-09-27 ENCOUNTER — Ambulatory Visit (INDEPENDENT_AMBULATORY_CARE_PROVIDER_SITE_OTHER): Payer: PPO | Admitting: Cardiovascular Disease

## 2018-09-27 VITALS — BP 120/70 | HR 93 | Temp 98.4°F | Ht 64.0 in | Wt 137.5 lb

## 2018-09-27 DIAGNOSIS — R002 Palpitations: Secondary | ICD-10-CM

## 2018-09-27 DIAGNOSIS — I319 Disease of pericardium, unspecified: Secondary | ICD-10-CM | POA: Diagnosis not present

## 2018-09-27 NOTE — Patient Instructions (Addendum)

## 2018-09-27 NOTE — Progress Notes (Signed)
Cardiology Office Note   Date:  09/27/2018   ID:  Debra Hale, DOB [birth year], MRN [id number]  PCP:  [name], MD  Cardiologist:   Kathlyn Sacramento, MD   Chief Complaint  Patient presents with  . other    6 month follow up. Meds reviewed by the pt. verbally. "doing well." Pt. c/o feeling very nervous.       History of Present Illness: Debra Hale is a 68 y.o. female who presents for a follow up visit regarding chest pain. She was seen by me in 2015 for recurrent pericarditis with documented small pericardial effusion which subsequently resolved with medical therapy. She has known history of hyperlipidemia.  Other medical conditions include thyroid carcinoma s/p surgery and XRT, colon cancer s/p laparoscopic right colectomy, nephrolithiasis, anxiety, osteoporosis and breast cancer. She had palpitations in the setting of hypokalemia and hypomagnesemia.  She had previous treadmill stress test in 2017 that showed no evidence of ischemia with poor exercise tolerance and hypertensive response to exercise.  She was seen by Christell Faith in January for atypical chest pain in the setting of upper respiratory tract infection.  She had work-up in the ED.  CTA was negative for pulmonary embolism but there was pleural effusion.  In addition, there was small to moderate sized pericardial effusion.  Sedimentation rate was elevated at 70.  She had an echocardiogram done in January which showed an EF of 50 to 55% with no pericardial effusion.  She was treated with colchicine and NSAIDs.  She reports resolution of chest pain. She had issues with headache and suspected temporal arteritis.  She was seen by rheumatology but the patient was scared about taking steroids as she tends to have significant side effects.  Past Medical History:  Diagnosis Date  . Anxiety   . Arthritis    Osteoarthritis  . Breast cancer ([place]) 2009   right breast lumpectomy with rad tx  . Colon cancer ([place])    surgery  with chemo and rad tx  . GERD (gastroesophageal reflux disease)   . Hypothyroidism   . Liver disease   . Liver nodule    s/p negative biopsy  . Malignant neoplasm of thyroid gland ([place]) 2002   s/p surgery and XRT  . Nephrolithiasis    s/p lithotripsy  . Osteoporosis   . Other and unspecified hyperlipidemia   . Palpitations   . Personal history of chemotherapy   . Personal history of malignant neoplasm of large intestine    carcinoma - cecum, s/p right laparoscopic colectomy - s/p chemotherapy and XRT  . Personal history of radiation therapy   . Pure hypercholesterolemia   . Unspecified hereditary and idiopathic peripheral neuropathy     Past Surgical History:  Procedure Laterality Date  . APPENDECTOMY  1985  . BREAST BIOPSY Right 2009   positive  . BREAST BIOPSY Right 2009   negative  . BREAST LUMPECTOMY Right 2009   breast cancer  . CHOLECYSTECTOMY  1995  . COLONOSCOPY    . COLONOSCOPY WITH PROPOFOL N/A 10/29/2017   Procedure: COLONOSCOPY WITH PROPOFOL;  Surgeon: Manya Silvas, MD;  Location: [facility] ENDOSCOPY;  Service: Endoscopy;  Laterality: N/A;  . DILATION AND CURETTAGE OF UTERUS  1990  . DILATION AND CURETTAGE, DIAGNOSTIC / THERAPEUTIC  1990  . ESOPHAGOGASTRODUODENOSCOPY    . ESOPHAGOGASTRODUODENOSCOPY (EGD) WITH PROPOFOL N/A 10/29/2017   Procedure: ESOPHAGOGASTRODUODENOSCOPY (EGD) WITH PROPOFOL;  Surgeon: Manya Silvas, MD;  Location: [facility] ENDOSCOPY;  Service: Endoscopy;  Laterality: N/A;  . LAPAROSCOPIC PARTIAL COLECTOMY     stage 3-C carcinoma of the cecum, s/p chemotherapy and xrt  . LITHOTRIPSY    . SIGMOIDOSCOPY  08/26/1993  . THYROID LOBECTOMY  2002   s/p XRT     Current Outpatient Medications  Medication Sig Dispense Refill  . clonazePAM (KLONOPIN) 0.5 MG tablet Take 1.5 mg by mouth 2 (two) times daily.   0  . escitalopram (LEXAPRO) 20 MG tablet Take 20 mg by mouth daily.      Marland Kitchen ibuprofen (ADVIL,MOTRIN) 600 MG tablet Take 1 tablet (600 mg total)  by mouth every 6 (six) hours as needed. 30 tablet 0  . levothyroxine (SYNTHROID, LEVOTHROID) 100 MCG tablet Take 1 tablet (100 mcg total) by mouth daily. 90 tablet 3  . magnesium oxide (MAG-OX) 400 MG tablet TAKE 1 TABLET BY MOUTH EVERY DAY 90 tablet 1  . pantoprazole (PROTONIX) 40 MG tablet TAKE 1 TABLET BY MOUTH EVERY DAY 90 tablet 2  . potassium chloride (KLOR-CON 10) 10 MEQ tablet Take 1 tablet (10 mEq total) by mouth daily. 90 tablet 1   Current Facility-Administered Medications  Medication Dose Route Frequency Provider Last Rate Last Dose  . ondansetron (ZOFRAN) tablet 8 mg  8 mg Oral Once Jodelle Green, FNP        Allergies:   Demeclocycline; Tetracyclines & related; Augmentin [amoxicillin-pot clavulanate]; Bentyl [dicyclomine hcl]; Ciprofloxacin; Codeine; Epinephrine; Flagyl [metronidazole]; Librax [chlordiazepoxide-clidinium]; Phenobarbital; Prednisone; and Ultram [tramadol]    Social History:  The patient  reports that she has never smoked. She has never used smokeless tobacco. She reports that she does not drink alcohol or use drugs.   Family History:  The patient's family history includes Alzheimer's disease in her mother; Breast cancer in her maternal aunt and sister; Cancer in her father and mother; Colon cancer in her father; Lung cancer in her father and sister; Prostate cancer in her father; Stroke in her mother.    ROS:  Please see the history of present illness.   Otherwise, review of systems are positive for none.   All other systems are reviewed and negative.    PHYSICAL EXAM: VS:  BP 120/70 (BP Location: Left Arm, Patient Position: Sitting, Cuff Size: Normal)   Pulse 93   Temp 98.4 F (36.9 C)   Ht 5\' 4"  (1.626 m)   Wt 137 lb 8 oz (62.4 kg)   SpO2 98%   BMI 23.60 kg/m  , BMI Body mass index is 23.6 kg/m. GEN: Well nourished, well developed, in no acute distress  HEENT: normal  Neck: no JVD, carotid bruits, or masses Cardiac: RRR; no murmurs, rubs, or  gallops,no edema  Respiratory:  clear to auscultation bilaterally, normal work of breathing GI: soft, nontender, nondistended, + BS MS: no deformity or atrophy  Skin: warm and dry, no rash Neuro:  Strength and sensation are intact Psych: euthymic mood, full affect   EKG:  EKG is not ordered today.    Recent Labs: 05/19/2018: ALT 13; Hemoglobin 12.7; Platelets 210.0; TSH 2.90 06/02/2018: BUN 14; Creatinine, Ser 0.97; Potassium 3.6; Sodium 140    Lipid Panel    Component Value Date/Time   CHOL 211 (H) 09/23/2017 1106   TRIG 167.0 (H) 09/23/2017 1106   HDL 47.80 09/23/2017 1106   CHOLHDL 4 09/23/2017 1106   VLDL 33.4 09/23/2017 1106   LDLCALC 129 (H) 09/23/2017 1106   LDLDIRECT 126.0 07/11/2015 0956      Wt Readings from Last 3 Encounters:  09/27/18 137 lb 8 oz (62.4 kg)  06/21/18 134 lb (60.8 kg)  05/23/18 140 lb (63.5 kg)       ASSESSMENT AND PLAN:  1.  History of atypical chest pain and suspected pericarditis: Echocardiogram in January showed no pericardial effusion.  The patient symptoms of sharp chest pain have resolved completely.  No further work-up is needed at the present time. She seems to be having significant anxiety symptoms.  2. Palpitations: In the setting of hypokalemia and hypomagnesemia.  No current symptoms.   Disposition:   FU in 6 months  Signed,  Kathlyn Sacramento, MD  09/27/2018 4:32 PM    [facility]

## 2018-10-04 DIAGNOSIS — R51 Headache: Secondary | ICD-10-CM | POA: Diagnosis not present

## 2018-11-01 ENCOUNTER — Other Ambulatory Visit: Payer: Self-pay | Admitting: Nurse Practitioner

## 2018-11-01 DIAGNOSIS — K219 Gastro-esophageal reflux disease without esophagitis: Secondary | ICD-10-CM | POA: Diagnosis not present

## 2018-11-01 DIAGNOSIS — K760 Fatty (change of) liver, not elsewhere classified: Secondary | ICD-10-CM

## 2018-11-01 DIAGNOSIS — K580 Irritable bowel syndrome with diarrhea: Secondary | ICD-10-CM | POA: Diagnosis not present

## 2018-11-01 DIAGNOSIS — R748 Abnormal levels of other serum enzymes: Secondary | ICD-10-CM | POA: Diagnosis not present

## 2018-11-01 DIAGNOSIS — Z85038 Personal history of other malignant neoplasm of large intestine: Secondary | ICD-10-CM | POA: Diagnosis not present

## 2018-11-09 ENCOUNTER — Other Ambulatory Visit: Payer: Self-pay

## 2018-11-09 ENCOUNTER — Ambulatory Visit
Admission: RE | Admit: 2018-11-09 | Discharge: 2018-11-09 | Disposition: A | Discharge status: Home / Self Care | Payer: PPO | Source: Ambulatory Visit | Attending: Nurse Practitioner | Admitting: Nurse Practitioner

## 2018-11-09 DIAGNOSIS — Z9049 Acquired absence of other specified parts of digestive tract: Secondary | ICD-10-CM | POA: Diagnosis not present

## 2018-11-09 DIAGNOSIS — Z85038 Personal history of other malignant neoplasm of large intestine: Secondary | ICD-10-CM

## 2018-11-09 DIAGNOSIS — D72819 Decreased white blood cell count, unspecified: Secondary | ICD-10-CM | POA: Diagnosis not present

## 2018-11-09 DIAGNOSIS — K760 Fatty (change of) liver, not elsewhere classified: Secondary | ICD-10-CM

## 2018-11-14 ENCOUNTER — Other Ambulatory Visit: Payer: Self-pay | Admitting: Internal Medicine

## 2018-11-14 DIAGNOSIS — R790 Abnormal level of blood mineral: Secondary | ICD-10-CM

## 2018-11-25 ENCOUNTER — Other Ambulatory Visit (INDEPENDENT_AMBULATORY_CARE_PROVIDER_SITE_OTHER): Payer: PPO

## 2018-11-25 ENCOUNTER — Other Ambulatory Visit: Payer: Self-pay

## 2018-11-25 DIAGNOSIS — E7800 Pure hypercholesterolemia, unspecified: Secondary | ICD-10-CM

## 2018-11-25 DIAGNOSIS — E039 Hypothyroidism, unspecified: Secondary | ICD-10-CM

## 2018-11-25 LAB — BASIC METABOLIC PANEL
BUN: 19 mg/dL (ref 6–23)
CO2: 29 mEq/L (ref 19–32)
Calcium: 9.3 mg/dL (ref 8.4–10.5)
Chloride: 106 mEq/L (ref 96–112)
Creatinine, Ser: 0.99 mg/dL (ref 0.40–1.20)
GFR: 55.61 mL/min — ABNORMAL LOW (ref >60.00)
Glucose, Bld: 93 mg/dL (ref 70–99)
Potassium: 3.9 mEq/L (ref 3.5–5.1)
Sodium: 143 mEq/L (ref 135–145)

## 2018-11-25 LAB — CBC WITH DIFFERENTIAL/PLATELET
Basophils Absolute: 0 10*3/uL (ref 0.0–0.1)
Basophils Relative: 1 % (ref 0.0–3.0)
Eosinophils Absolute: 0 10*3/uL (ref 0.0–0.7)
Eosinophils Relative: 1.2 % (ref 0.0–5.0)
HCT: 45 % (ref 36.0–46.0)
Hemoglobin: 14.9 g/dL (ref 12.0–15.0)
Lymphocytes Relative: 31.3 % (ref 12.0–46.0)
Lymphs Abs: 0.9 10*3/uL (ref 0.7–4.0)
MCHC: 33.2 g/dL (ref 30.0–36.0)
MCV: 83.3 fl (ref 78.0–100.0)
Monocytes Absolute: 0.2 10*3/uL (ref 0.1–1.0)
Monocytes Relative: 8 % (ref 3.0–12.0)
Neutro Abs: 1.6 10*3/uL (ref 1.4–7.7)
Neutrophils Relative %: 58.5 % (ref 43.0–77.0)
Platelets: 182 10*3/uL (ref 150.0–400.0)
RBC: 5.4 Mil/uL — ABNORMAL HIGH (ref 3.87–5.11)
RDW: 14.8 % (ref 11.5–15.5)
WBC: 2.7 10*3/uL — ABNORMAL LOW (ref 4.0–10.5)

## 2018-11-25 LAB — LIPID PANEL
Cholesterol: 171 mg/dL (ref 0–200)
HDL: 51.8 mg/dL (ref >39.00)
LDL Cholesterol: 99 mg/dL (ref 0–99)
NonHDL: 119.22
Total CHOL/HDL Ratio: 3
Triglycerides: 103 mg/dL (ref 0.0–149.0)
VLDL: 20.6 mg/dL (ref 0.0–40.0)

## 2018-11-25 LAB — HEPATIC FUNCTION PANEL
ALT: 33 U/L (ref 0–35)
AST: 19 U/L (ref 0–37)
Albumin: 4.1 g/dL (ref 3.5–5.2)
Alkaline Phosphatase: 108 U/L (ref 39–117)
Bilirubin, Direct: 0.1 mg/dL (ref 0.0–0.3)
Total Bilirubin: 0.6 mg/dL (ref 0.2–1.2)
Total Protein: 6.7 g/dL (ref 6.0–8.3)

## 2018-11-25 LAB — TSH: TSH: 1.55 u[IU]/mL (ref 0.35–4.50)

## 2018-11-28 ENCOUNTER — Encounter: Payer: Self-pay | Admitting: Internal Medicine

## 2018-11-28 ENCOUNTER — Other Ambulatory Visit: Payer: Self-pay

## 2018-11-28 ENCOUNTER — Ambulatory Visit (INDEPENDENT_AMBULATORY_CARE_PROVIDER_SITE_OTHER): Payer: PPO | Admitting: Internal Medicine

## 2018-11-28 DIAGNOSIS — Z1239 Encounter for other screening for malignant neoplasm of breast: Secondary | ICD-10-CM

## 2018-11-28 DIAGNOSIS — E039 Hypothyroidism, unspecified: Secondary | ICD-10-CM

## 2018-11-28 DIAGNOSIS — K219 Gastro-esophageal reflux disease without esophagitis: Secondary | ICD-10-CM | POA: Diagnosis not present

## 2018-11-28 DIAGNOSIS — Z9109 Other allergy status, other than to drugs and biological substances: Secondary | ICD-10-CM

## 2018-11-28 DIAGNOSIS — C73 Malignant neoplasm of thyroid gland: Secondary | ICD-10-CM | POA: Diagnosis not present

## 2018-11-28 DIAGNOSIS — F320 Major depressive disorder, single episode, mild: Secondary | ICD-10-CM | POA: Diagnosis not present

## 2018-11-28 DIAGNOSIS — M810 Age-related osteoporosis without current pathological fracture: Secondary | ICD-10-CM | POA: Diagnosis not present

## 2018-11-28 DIAGNOSIS — R7989 Other specified abnormal findings of blood chemistry: Secondary | ICD-10-CM

## 2018-11-28 DIAGNOSIS — F419 Anxiety disorder, unspecified: Secondary | ICD-10-CM | POA: Diagnosis not present

## 2018-11-28 DIAGNOSIS — Z853 Personal history of malignant neoplasm of breast: Secondary | ICD-10-CM | POA: Diagnosis not present

## 2018-11-28 DIAGNOSIS — R945 Abnormal results of liver function studies: Secondary | ICD-10-CM | POA: Diagnosis not present

## 2018-11-28 DIAGNOSIS — M25551 Pain in right hip: Secondary | ICD-10-CM | POA: Diagnosis not present

## 2018-11-28 DIAGNOSIS — J90 Pleural effusion, not elsewhere classified: Secondary | ICD-10-CM

## 2018-11-28 DIAGNOSIS — E7800 Pure hypercholesterolemia, unspecified: Secondary | ICD-10-CM | POA: Diagnosis not present

## 2018-11-28 DIAGNOSIS — F32A Depression, unspecified: Secondary | ICD-10-CM

## 2018-11-28 DIAGNOSIS — D72819 Decreased white blood cell count, unspecified: Secondary | ICD-10-CM

## 2018-11-28 NOTE — Progress Notes (Signed)
Patient ID: Debra Hale, female   DOB: 1951/03/16, 68 y.o.   MRN: [id number]   Virtual Visit via telephone Note  This visit type was conducted due to national recommendations for restrictions regarding the COVID-19 pandemic (e.g. social distancing).  This format is felt to be most appropriate for this patient at this time.  All issues noted in this document were discussed and addressed.  No physical exam was performed (except for noted visual exam findings with Video Visits).   I connected with [name] by telephone and verified that I am speaking with the correct person using two identifiers. Location patient: home Location provider: work Persons participating in the telephone visit: patient, provider  I discussed the limitations, risks, security and privacy concerns of performing an evaluation and management service by telephone and the availability of in person appointments.  The patient expressed understanding and agreed to proceed.   Reason for visit: scheduled follow up.    HPI: She reports she is doing relatively well.  Saw GI 11/01/18.  Doing better.  Recommended f/u in 6 months.  She is eating.  Some increased gas.  Notices more after eating.  Last liver panel wnl.  No chest pain.  Saw Dr Fletcher Anon 09/27/18.  Stable.  Felt no further w/up warranted.  No sob.  No cough or congestion.  No acid reflux.  No abdominal pain.  Bowels moving.  Seeing Dr Clovis Riley.  Doing well on current regimen.  Some right leg/right groin pain.  Pain into right lateral hip.  If sitting/lying - no pain.  Worse when gets up.  No pain down leg.  No numbness or tingling.     ROS: See pertinent positives and negatives per HPI.  Past Medical History:  Diagnosis Date  . Anxiety   . Arthritis    Osteoarthritis  . Breast cancer ([place]) 2009   right breast lumpectomy with rad tx  . Colon cancer ([place])    surgery with chemo and rad tx  . GERD (gastroesophageal reflux disease)   . Hypothyroidism   . Liver disease    . Liver nodule    s/p negative biopsy  . Malignant neoplasm of thyroid gland ([place]) 2002   s/p surgery and XRT  . Nephrolithiasis    s/p lithotripsy  . Osteoporosis   . Other and unspecified hyperlipidemia   . Palpitations   . Personal history of chemotherapy   . Personal history of malignant neoplasm of large intestine    carcinoma - cecum, s/p right laparoscopic colectomy - s/p chemotherapy and XRT  . Personal history of radiation therapy   . Pure hypercholesterolemia   . Unspecified hereditary and idiopathic peripheral neuropathy     Past Surgical History:  Procedure Laterality Date  . APPENDECTOMY  1985  . BREAST BIOPSY Right 2009   positive  . BREAST BIOPSY Right 2009   negative  . BREAST LUMPECTOMY Right 2009   breast cancer  . CHOLECYSTECTOMY  1995  . COLONOSCOPY    . COLONOSCOPY WITH PROPOFOL N/A 10/29/2017   Procedure: COLONOSCOPY WITH PROPOFOL;  Surgeon: Manya Silvas, MD;  Location[facility] ENDOSCOPY;  Service: Endoscopy;  Laterality: N/A;  . DILATION AND CURETTAGE OF UTERUS  1990  . DILATION AND CURETTAGE, DIAGNOSTIC / THERAPEUTIC  1990  . ESOPHAGOGASTRODUODENOSCOPY    . ESOPHAGOGASTRODUODENOSCOPY (EGD) WITH PROPOFOL N/A 10/29/2017   Procedure: ESOPHAGOGASTRODUODENOSCOPY (EGD) WITH PROPOFOL;  Surgeon: Manya Silvas, MD;  Location: [facility] ENDOSCOPY;  Service: Endoscopy;  Laterality: N/A;  . LAPAROSCOPIC  PARTIAL COLECTOMY     stage 3-C carcinoma of the cecum, s/p chemotherapy and xrt  . LITHOTRIPSY    . SIGMOIDOSCOPY  08/26/1993  . THYROID LOBECTOMY  2002   s/p XRT    Family History  Problem Relation Age of Onset  . Stroke Mother        6s  . Alzheimer's disease Mother   . Cancer Mother   . Lung cancer Father   . Prostate cancer Father   . Cancer Father        Colon  . Colon cancer Father   . Breast cancer Sister        43's  . Lung cancer Sister   . Breast cancer Maternal Aunt     SOCIAL HX: reviewed.    Current Outpatient Medications:  .   colestipol (COLESTID) 1 g tablet, Take by mouth., Disp: , Rfl:  .  clonazePAM (KLONOPIN) 0.5 MG tablet, Take 1.5 mg by mouth 2 (two) times daily. , Disp: , Rfl: 0 .  escitalopram (LEXAPRO) 20 MG tablet, Take 20 mg by mouth daily.  , Disp: , Rfl:  .  ibuprofen (ADVIL,MOTRIN) 600 MG tablet, Take 1 tablet (600 mg total) by mouth every 6 (six) hours as needed., Disp: 30 tablet, Rfl: 0 .  levothyroxine (SYNTHROID, LEVOTHROID) 100 MCG tablet, Take 1 tablet (100 mcg total) by mouth daily., Disp: 90 tablet, Rfl: 3 .  magnesium oxide (MAG-OX) 400 MG tablet, TAKE 1 TABLET BY MOUTH EVERY DAY, Disp: 90 tablet, Rfl: 1 .  pantoprazole (PROTONIX) 40 MG tablet, TAKE 1 TABLET BY MOUTH EVERY DAY, Disp: 90 tablet, Rfl: 2 .  potassium chloride (KLOR-CON 10) 10 MEQ tablet, Take 1 tablet (10 mEq total) by mouth daily., Disp: 90 tablet, Rfl: 1  Current Facility-Administered Medications:  .  ondansetron (ZOFRAN) tablet 8 mg, 8 mg, Oral, Once, Guse, Jacquelynn Cree, FNP  EXAM:  GENERAL: alert.  Sounds to be in no acute distress.  Answering questions appropriately.    PSYCH/NEURO: pleasant and cooperative, no obvious depression or anxiety, speech and thought processing grossly intact  ASSESSMENT AND PLAN:  Discussed the following assessment and plan:  Abnormal liver function test Worked up by GI.  Fatty liver.  Planning for [place] and fibroscan.  Last evaluated 11/01/18.  Recommended f/u in 6 months.  Follow liver function tests.    Anxiety Being followed by psychiatry - Dr Clovis Riley.  Stable on current regimen. Doing better.    CARCINOMA, THYROID GLAND, PAPILLARY On thyroid replacement.  Follow tsh.   Environmental allergies Controlled.    GERD (gastroesophageal reflux disease) Controlled on current regimen.  Follow.    History of breast cancer Mammogram 10/12/17 - Birads I.  Schedule f/u mammogram.    Hypercholesterolemia Low cholesterol diet and exercise.  Follow lipid panel.   Hypothyroidism On thyroid  replacement.  Follow tsh.   Mild depression (HCC) Seeing Dr Clovis Riley.  Doing well on current medication regimen.  Follow.    Osteoporosis On aromasin.  Needs f/u bone density.   Pleural effusion Needs f/u cxr to confirm clearance.    Leukopenia White count slightly decreased.  Recheck cbc to confirm normalizes.    Right hip pain Persistent.  Check xray.     I discussed the assessment and treatment plan with the patient. The patient was provided an opportunity to ask questions and all were answered. The patient agreed with the plan and demonstrated an understanding of the instructions.   The patient was advised to  call back or seek an in-person evaluation if the symptoms worsen or if the condition fails to improve as anticipated.  I provided 25 minutes of non-face-to-face time during this encounter.   [name], MD

## 2018-12-03 ENCOUNTER — Encounter: Payer: Self-pay | Admitting: Internal Medicine

## 2018-12-03 DIAGNOSIS — J90 Pleural effusion, not elsewhere classified: Secondary | ICD-10-CM | POA: Insufficient documentation

## 2018-12-03 DIAGNOSIS — M25551 Pain in right hip: Secondary | ICD-10-CM | POA: Insufficient documentation

## 2018-12-03 NOTE — Assessment & Plan Note (Signed)
Low cholesterol diet and exercise.  Follow lipid panel.   

## 2018-12-03 NOTE — Assessment & Plan Note (Signed)
Controlled.  

## 2018-12-03 NOTE — Assessment & Plan Note (Signed)
White count slightly decreased.  Recheck cbc to confirm normalizes.

## 2018-12-03 NOTE — Assessment & Plan Note (Signed)
Worked up by GI.  Fatty liver.  Planning for [place] and fibroscan.  Last evaluated 11/01/18.  Recommended f/u in 6 months.  Follow liver function tests.

## 2018-12-03 NOTE — Assessment & Plan Note (Signed)
On thyroid replacement.  Follow tsh.  

## 2018-12-03 NOTE — Assessment & Plan Note (Signed)
Persistent.  Check xray.   

## 2018-12-03 NOTE — Assessment & Plan Note (Signed)
Controlled on current regimen.  Follow.  

## 2018-12-03 NOTE — Assessment & Plan Note (Signed)
Being followed by psychiatry - Dr Clovis Riley.  Stable on current regimen. Doing better.

## 2018-12-03 NOTE — Assessment & Plan Note (Signed)
Mammogram 10/12/17 - Birads I.  Schedule f/u mammogram.

## 2018-12-03 NOTE — Assessment & Plan Note (Signed)
Needs f/u cxr to confirm clearance.

## 2018-12-03 NOTE — Assessment & Plan Note (Signed)
Seeing Dr Clovis Riley.  Doing well on current medication regimen.  Follow.

## 2018-12-03 NOTE — Assessment & Plan Note (Addendum)
On aromasin.  Needs f/u bone density.

## 2018-12-06 ENCOUNTER — Telehealth: Payer: Self-pay

## 2018-12-06 NOTE — Telephone Encounter (Signed)
Copied from [place] [id number]. Topic: General - Other >> Dec 06, 2018  3:26 PM Mcneil, Ja-Kwan wrote: Reason for CRM: Pt stated she was told that she would be contacted for an appt to x-ray her hip but she has not heard from anyone. Pt requests call back

## 2018-12-07 NOTE — Telephone Encounter (Signed)
LMTCB

## 2018-12-08 ENCOUNTER — Encounter: Payer: Self-pay | Admitting: Internal Medicine

## 2018-12-08 NOTE — Telephone Encounter (Signed)
Pt returned call to office. Attempted to transfer pt to the office but there was no answer. Pt requests call back.

## 2018-12-09 NOTE — Telephone Encounter (Signed)
LMTCB. Pt needs lab appt in 3-4 weeks and go to [place] for x-ray

## 2018-12-27 ENCOUNTER — Other Ambulatory Visit: Payer: Self-pay

## 2018-12-27 ENCOUNTER — Ambulatory Visit (INDEPENDENT_AMBULATORY_CARE_PROVIDER_SITE_OTHER)
Admission: RE | Admit: 2018-12-27 | Discharge: 2018-12-27 | Disposition: A | Discharge status: Home / Self Care | Payer: PPO | Source: Ambulatory Visit | Attending: Internal Medicine | Admitting: Internal Medicine

## 2018-12-27 ENCOUNTER — Other Ambulatory Visit: Payer: PPO

## 2018-12-27 DIAGNOSIS — M25551 Pain in right hip: Secondary | ICD-10-CM

## 2018-12-27 DIAGNOSIS — R918 Other nonspecific abnormal finding of lung field: Secondary | ICD-10-CM | POA: Diagnosis not present

## 2018-12-27 DIAGNOSIS — M1611 Unilateral primary osteoarthritis, right hip: Secondary | ICD-10-CM | POA: Diagnosis not present

## 2018-12-27 DIAGNOSIS — J90 Pleural effusion, not elsewhere classified: Secondary | ICD-10-CM | POA: Diagnosis not present

## 2018-12-29 ENCOUNTER — Telehealth: Payer: Self-pay | Admitting: Internal Medicine

## 2018-12-29 NOTE — Telephone Encounter (Signed)
Medication Refill - Medication: potassium chloride (KLOR-CON 10) 10 MEQ tablet    Has the patient contacted their pharmacy? Yes.   (Agent: If no, request that the patient contact the pharmacy for the refill.) (Agent: If yes, when and what did the pharmacy advise?) pharmacy called but couldn't get in touch with provider   Preferred Pharmacy (with phone number or street name):  [id number] [address], [place] [phone] (Phone) [phone] (Fax)     Agent: Please be advised that RX refills may take up to 3 business days. We ask that you follow-up with your pharmacy.

## 2018-12-30 ENCOUNTER — Other Ambulatory Visit: Payer: Self-pay | Admitting: Internal Medicine

## 2018-12-30 ENCOUNTER — Other Ambulatory Visit: Payer: Self-pay

## 2018-12-30 DIAGNOSIS — M25551 Pain in right hip: Secondary | ICD-10-CM

## 2018-12-30 DIAGNOSIS — E876 Hypokalemia: Secondary | ICD-10-CM

## 2018-12-30 MED ORDER — POTASSIUM CHLORIDE ER 10 MEQ PO TBCR: 10.0000 meq | EXTENDED_RELEASE_TABLET | Freq: Every day | ORAL | 1 refills | Status: DC

## 2018-12-30 NOTE — Telephone Encounter (Signed)
rx refilled.

## 2018-12-30 NOTE — Progress Notes (Signed)
Order placed for referral to physical therapy.  

## 2018-12-31 ENCOUNTER — Other Ambulatory Visit: Payer: Self-pay | Admitting: Internal Medicine

## 2019-01-05 DIAGNOSIS — M25551 Pain in right hip: Secondary | ICD-10-CM | POA: Diagnosis not present

## 2019-01-11 DIAGNOSIS — M25551 Pain in right hip: Secondary | ICD-10-CM | POA: Diagnosis not present

## 2019-01-16 ENCOUNTER — Ambulatory Visit: Admit: 2019-01-16 | Payer: PPO | Admitting: Internal Medicine

## 2019-01-16 SURGERY — COLONOSCOPY WITH PROPOFOL
Anesthesia: General

## 2019-01-18 DIAGNOSIS — M25551 Pain in right hip: Secondary | ICD-10-CM | POA: Diagnosis not present

## 2019-01-20 DIAGNOSIS — M25551 Pain in right hip: Secondary | ICD-10-CM | POA: Diagnosis not present

## 2019-01-25 DIAGNOSIS — M25551 Pain in right hip: Secondary | ICD-10-CM | POA: Diagnosis not present

## 2019-01-27 ENCOUNTER — Ambulatory Visit: Payer: Self-pay | Admitting: *Deleted

## 2019-01-27 DIAGNOSIS — M25511 Pain in right shoulder: Secondary | ICD-10-CM | POA: Diagnosis not present

## 2019-01-27 DIAGNOSIS — M47812 Spondylosis without myelopathy or radiculopathy, cervical region: Secondary | ICD-10-CM | POA: Diagnosis not present

## 2019-01-27 DIAGNOSIS — M25551 Pain in right hip: Secondary | ICD-10-CM | POA: Diagnosis not present

## 2019-01-27 NOTE — Telephone Encounter (Signed)
Pt going to [facility]

## 2019-01-27 NOTE — Telephone Encounter (Signed)
Patient states she has been doing PT for her leg and she thinks she may have pulled something in her arm- or slept on it wrong. Advised due to severe pain and limited use- ED for evaluation- patient refuses ED- she has agreed to go to [facility].  Reason for Disposition . [1] SEVERE pain AND [2] not improved 2 hours after pain medicine  Answer Assessment - Initial Assessment Questions 1. ONSET: "When did the pain start?"     Last week-woke up with it-patient thinks she slept on it wrong 2. LOCATION: "Where is the pain located?"     R arm- below the shoulder into the body- to the elbow 3. PAIN: "How bad is the pain?" (Scale 1-10; or mild, moderate, severe)   - MILD (1-3): doesn't interfere with normal activities   - MODERATE (4-7): interferes with normal activities (e.g., work or school) or awakens from sleep   - SEVERE (8-10): excruciating pain, unable to do any normal activities, unable to hold a cup of water     severe 4. WORK OR EXERCISE: "Has there been any recent work or exercise that involved this part of the body?"     No- patient is doing PT for legs 5. CAUSE: "What do you think is causing the arm pain?"     Patient thinks she may hurt a ligament 6. OTHER SYMPTOMS: "Do you have any other symptoms?" (e.g., neck pain, swelling, rash, fever, numbness, weakness)     no 7. PREGNANCY: "Is there any chance you are pregnant?" "When was your last menstrual period?"     n/a  Protocols used: ARM PAIN-A-AH

## 2019-02-01 DIAGNOSIS — M25551 Pain in right hip: Secondary | ICD-10-CM | POA: Diagnosis not present

## 2019-02-03 DIAGNOSIS — M25551 Pain in right hip: Secondary | ICD-10-CM | POA: Diagnosis not present

## 2019-02-08 DIAGNOSIS — M25551 Pain in right hip: Secondary | ICD-10-CM | POA: Diagnosis not present

## 2019-02-10 DIAGNOSIS — M25551 Pain in right hip: Secondary | ICD-10-CM | POA: Diagnosis not present

## 2019-02-17 DIAGNOSIS — M25551 Pain in right hip: Secondary | ICD-10-CM | POA: Diagnosis not present

## 2019-02-22 DIAGNOSIS — M25551 Pain in right hip: Secondary | ICD-10-CM | POA: Diagnosis not present

## 2019-02-24 DIAGNOSIS — M25551 Pain in right hip: Secondary | ICD-10-CM | POA: Diagnosis not present

## 2019-03-01 DIAGNOSIS — M25551 Pain in right hip: Secondary | ICD-10-CM | POA: Diagnosis not present

## 2019-03-08 DIAGNOSIS — M25551 Pain in right hip: Secondary | ICD-10-CM | POA: Diagnosis not present

## 2019-03-15 DIAGNOSIS — M25551 Pain in right hip: Secondary | ICD-10-CM | POA: Diagnosis not present

## 2019-03-17 DIAGNOSIS — M25551 Pain in right hip: Secondary | ICD-10-CM | POA: Diagnosis not present

## 2019-03-22 DIAGNOSIS — M25551 Pain in right hip: Secondary | ICD-10-CM | POA: Diagnosis not present

## 2019-03-24 DIAGNOSIS — M25551 Pain in right hip: Secondary | ICD-10-CM | POA: Diagnosis not present

## 2019-03-29 DIAGNOSIS — M25551 Pain in right hip: Secondary | ICD-10-CM | POA: Diagnosis not present

## 2019-03-31 DIAGNOSIS — M25551 Pain in right hip: Secondary | ICD-10-CM | POA: Diagnosis not present

## 2019-04-06 DIAGNOSIS — M25551 Pain in right hip: Secondary | ICD-10-CM | POA: Diagnosis not present

## 2019-04-06 DIAGNOSIS — M1611 Unilateral primary osteoarthritis, right hip: Secondary | ICD-10-CM | POA: Diagnosis not present

## 2019-04-06 DIAGNOSIS — G8929 Other chronic pain: Secondary | ICD-10-CM | POA: Diagnosis not present

## 2019-04-07 ENCOUNTER — Ambulatory Visit: Payer: Self-pay

## 2019-04-07 NOTE — Telephone Encounter (Signed)
Pt. Reports she saw bright red blood with her bowel movement today. Reports she does have hemorrhoids and had a hard stool. Usually has looser stools. Feels like it could be related to her hemorrhoids.Would like to know if she should be seen. Please advise pt.No availability per Santiago Glad in the practice.  Reason for Disposition . MILD rectal bleeding (more than just a few drops or streaks)  Answer Assessment - Initial Assessment Questions 1. APPEARANCE of BLOOD: "What color is it?" "Is it passed separately, on the surface of the stool, or mixed in with the stool?"      Bright red in the toilet water 2. AMOUNT: "How much blood was passed?"      More than a tablespoon 3. FREQUENCY: "How many times has blood been passed with the stools?"      Once 4. ONSET: "When was the blood first seen in the stools?" (Days or weeks)      Today 5. DIARRHEA: "Is there also some diarrhea?" If so, ask: "How many diarrhea stools were passed in past 24 hours?"      Yes - loose stool 6. CONSTIPATION: "Do you have constipation?" If so, "How bad is it?"     Hard stool today 7. RECURRENT SYMPTOMS: "Have you had blood in your stools before?" If so, ask: "When was the last time?" and "What happened that time?"      No 8. BLOOD THINNERS: "Do you take any blood thinners?" (e.g., Coumadin/warfarin, Pradaxa/dabigatran, aspirin)     No 9. OTHER SYMPTOMS: "Do you have any other symptoms?"  (e.g., abdominal pain, vomiting, dizziness, fever)     No 10. PREGNANCY: "Is there any chance you are pregnant?" "When was your last menstrual period?"       No  Protocols used: RECTAL BLEEDING-A-AH

## 2019-04-07 NOTE — Telephone Encounter (Signed)
Called pt to clarify. Stated that she has only noticed blood one time. She does have hemorrhoids. When she had a bowel movement today it was harder than normal for her. Noticed very minimal amount of bright red blood. No persistent bleeding or excessive amount of blood. No acute symptoms noted. Pt is going to monitor and if she notices anymore blood or any acute symptoms she will be evaluated.

## 2019-04-25 ENCOUNTER — Ambulatory Visit: Payer: PPO

## 2019-04-25 ENCOUNTER — Telehealth: Payer: Self-pay

## 2019-04-25 NOTE — Telephone Encounter (Signed)
Unable to reach patient for scheduled annual wellness visit. No answer. Left message to call me back. Please reschedule as appropriate.

## 2019-05-19 ENCOUNTER — Other Ambulatory Visit: Payer: Self-pay | Admitting: Internal Medicine

## 2019-05-19 DIAGNOSIS — E039 Hypothyroidism, unspecified: Secondary | ICD-10-CM

## 2019-06-01 ENCOUNTER — Encounter: Payer: Self-pay | Admitting: Cardiovascular Disease

## 2019-06-01 ENCOUNTER — Other Ambulatory Visit: Payer: Self-pay

## 2019-06-01 ENCOUNTER — Ambulatory Visit (INDEPENDENT_AMBULATORY_CARE_PROVIDER_SITE_OTHER): Payer: PPO | Admitting: Cardiovascular Disease

## 2019-06-01 ENCOUNTER — Other Ambulatory Visit: Payer: Self-pay | Admitting: Internal Medicine

## 2019-06-01 VITALS — BP 110/80 | HR 96 | Ht 64.5 in | Wt 147.0 lb

## 2019-06-01 DIAGNOSIS — I319 Disease of pericardium, unspecified: Secondary | ICD-10-CM

## 2019-06-01 DIAGNOSIS — R002 Palpitations: Secondary | ICD-10-CM

## 2019-06-01 DIAGNOSIS — R790 Abnormal level of blood mineral: Secondary | ICD-10-CM

## 2019-06-01 NOTE — Progress Notes (Signed)
Cardiology Office Note   Date:  06/01/2019   ID:  Debra Hale, DOB October 18, 1950, MRN [id number]  PCP:  [name], MD  Cardiologist:   Kathlyn Sacramento, MD   Chief Complaint  Patient presents with  . office visit    6 month F/U; Meds verbally reviewed with patient.      History of Present Illness: Debra Hale is a 69 y.o. female who presents for a follow up visit recurrent pericarditis.  She has known history of hyperlipidemia, thyroid carcinoma s/p surgery and XRT, colon cancer s/p laparoscopic right colectomy, nephrolithiasis, anxiety, osteoporosis and breast cancer. She had palpitations in the setting of hypokalemia and hypomagnesemia.  She had previous treadmill stress test in 2017 that showed no evidence of ischemia with poor exercise tolerance and hypertensive response to exercise.  She had recurrent pericarditis and previous small pericardial effusion since 2017.  She had suspected pericarditis last year in the setting of  upper respiratory tract infection.  Sedimentation rate was elevated at 70.  She had follow-up echocardiogram done in January which showed an EF of 50 to 55% with no pericardial effusion.  She was treated with colchicine and NSAIDs.  She reports resolution of chest pain.  She has been doing reasonably well with no recent chest pain or shortness of breath.  She continues to struggle with anxiety.  She also reports right hip discomfort and she was told she might require hip replacement.  Past Medical History:  Diagnosis Date  . Anxiety   . Arthritis    Osteoarthritis  . Breast cancer ([place]) 2009   right breast lumpectomy with rad tx  . Colon cancer ([place])    surgery with chemo and rad tx  . GERD (gastroesophageal reflux disease)   . Hypothyroidism   . Liver disease   . Liver nodule    s/p negative biopsy  . Malignant neoplasm of thyroid gland ([place]) 2002   s/p surgery and XRT  . Nephrolithiasis    s/p lithotripsy  . Osteoporosis   . Other and  unspecified hyperlipidemia   . Palpitations   . Personal history of chemotherapy   . Personal history of malignant neoplasm of large intestine    carcinoma - cecum, s/p right laparoscopic colectomy - s/p chemotherapy and XRT  . Personal history of radiation therapy   . Pure hypercholesterolemia   . Unspecified hereditary and idiopathic peripheral neuropathy     Past Surgical History:  Procedure Laterality Date  . APPENDECTOMY  1985  . BREAST BIOPSY Right 2009   positive  . BREAST BIOPSY Right 2009   negative  . BREAST LUMPECTOMY Right 2009   breast cancer  . CHOLECYSTECTOMY  1995  . COLONOSCOPY    . COLONOSCOPY WITH PROPOFOL N/A 10/29/2017   Procedure: COLONOSCOPY WITH PROPOFOL;  Surgeon: Manya Silvas, MD;  Location: [facility] ENDOSCOPY;  Service: Endoscopy;  Laterality: N/A;  . DILATION AND CURETTAGE OF UTERUS  1990  . DILATION AND CURETTAGE, DIAGNOSTIC / THERAPEUTIC  1990  . ESOPHAGOGASTRODUODENOSCOPY    . ESOPHAGOGASTRODUODENOSCOPY (EGD) WITH PROPOFOL N/A 10/29/2017   Procedure: ESOPHAGOGASTRODUODENOSCOPY (EGD) WITH PROPOFOL;  Surgeon: Manya Silvas, MD;  Location: [facility] ENDOSCOPY;  Service: Endoscopy;  Laterality: N/A;  . LAPAROSCOPIC PARTIAL COLECTOMY     stage 3-C carcinoma of the cecum, s/p chemotherapy and xrt  . LITHOTRIPSY    . SIGMOIDOSCOPY  08/26/1993  . THYROID LOBECTOMY  2002   s/p XRT     Current Outpatient Medications  Medication Sig Dispense Refill  . clonazePAM (KLONOPIN) 0.5 MG tablet Take 1.5 mg by mouth 2 (two) times daily.   0  . colestipol (COLESTID) 1 g tablet Take by mouth daily.     Marland Kitchen escitalopram (LEXAPRO) 20 MG tablet Take 20 mg by mouth daily.      Marland Kitchen ibuprofen (ADVIL,MOTRIN) 600 MG tablet Take 1 tablet (600 mg total) by mouth every 6 (six) hours as needed. 30 tablet 0  . levothyroxine (SYNTHROID) 100 MCG tablet TAKE 1 TABLET BY MOUTH EVERY DAY 90 tablet 3  . magnesium oxide (MAG-OX) 400 MG tablet TAKE 1 TABLET BY MOUTH EVERY DAY 90 tablet 1   . pantoprazole (PROTONIX) 40 MG tablet TAKE 1 TABLET BY MOUTH EVERY DAY 90 tablet 2  . potassium chloride (KLOR-CON) 10 MEQ tablet TAKE 1 TABLET BY MOUTH EVERY DAY 90 tablet 1   Current Facility-Administered Medications  Medication Dose Route Frequency Provider Last Rate Last Admin  . ondansetron (ZOFRAN) tablet 8 mg  8 mg Oral Once Guse, Jacquelynn Cree, FNP        Allergies:   Demeclocycline, Tetracyclines & related, Augmentin [amoxicillin-pot clavulanate], Bentyl [dicyclomine hcl], Ciprofloxacin, Codeine, Epinephrine, Flagyl [metronidazole], Librax [chlordiazepoxide-clidinium], Phenobarbital, Prednisone, and Ultram [tramadol]    Social History:  The patient  reports that she has never smoked. She has never used smokeless tobacco. She reports that she does not drink alcohol or use drugs.   Family History:  The patient's family history includes Alzheimer's disease in her mother; Breast cancer in her maternal aunt and sister; Cancer in her father and mother; Colon cancer in her father; Lung cancer in her father and sister; Prostate cancer in her father; Stroke in her mother.    ROS:  Please see the history of present illness.   Otherwise, review of systems are positive for none.   All other systems are reviewed and negative.    PHYSICAL EXAM: VS:  BP 110/80 (BP Location: Left Arm, Patient Position: Sitting, Cuff Size: Normal)   Pulse 96   Ht 5' 4.5" (1.638 m)   Wt 147 lb (66.7 kg)   SpO2 97%   BMI 24.84 kg/m  , BMI Body mass index is 24.84 kg/m. GEN: Well nourished, well developed, in no acute distress  HEENT: normal  Neck: no JVD, carotid bruits, or masses Cardiac: RRR; no murmurs, rubs, or gallops,no edema  Respiratory:  clear to auscultation bilaterally, normal work of breathing GI: soft, nontender, nondistended, + BS MS: no deformity or atrophy  Skin: warm and dry, no rash Neuro:  Strength and sensation are intact Psych: euthymic mood, full affect   EKG:  EKG is  ordered today.  EKG showed normal sinus rhythm with low voltage and nonspecific T wave changes.   Recent Labs: 11/25/2018: ALT 33; BUN 19; Creatinine, Ser 0.99; Hemoglobin 14.9; Platelets 182.0; Potassium 3.9; Sodium 143; TSH 1.55    Lipid Panel    Component Value Date/Time   CHOL 171 11/25/2018 1025   TRIG 103.0 11/25/2018 1025   HDL 51.80 11/25/2018 1025   CHOLHDL 3 11/25/2018 1025   VLDL 20.6 11/25/2018 1025   LDLCALC 99 11/25/2018 1025   LDLDIRECT 126.0 07/11/2015 0956      Wt Readings from Last 3 Encounters:  06/01/19 147 lb (66.7 kg)  09/27/18 137 lb 8 oz (62.4 kg)  06/21/18 134 lb (60.8 kg)       ASSESSMENT AND PLAN:  1.  History of recurrent pericarditis: No recent symptoms.  She seems  to be stable overall. She seems to be having significant anxiety symptoms.  2. Palpitations: In the setting of hypokalemia and hypomagnesemia.  No current symptoms.  3.  If the patient requires right hip replacement, I do recommend ischemic cardiac evaluation with a Lexiscan Myoview given poor functional capacity related to her hip.   Disposition:   FU in 6 months  Signed,  Kathlyn Sacramento, MD  06/01/2019 1:22 PM    [place]

## 2019-06-01 NOTE — Patient Instructions (Signed)
Medication Instructions:  Your physician recommends that you continue on your current medications as directed. Please refer to the Current Medication list given to you today.  *If you need a refill on your cardiac medications before your next appointment, please call your pharmacy*  Lab Work: None ordered If you have labs (blood work) drawn today and your tests are completely normal, you will receive your results only by: Marland Kitchen MyChart Message (if you have MyChart) OR . A paper copy in the mail If you have any lab test that is abnormal or we need to change your treatment, we will call you to review the results.  Testing/Procedures: None ordered  Follow-Up: At [facility], you and your health needs are our priority.  As part of our continuing mission to provide you with exceptional heart care, we have created designated Provider Care Teams.  These Care Teams include your primary Cardiologist (physician) and Advanced Practice Providers (APPs -  Physician Assistants and Nurse Practitioners) who all work together to provide you with the care you need, when you need it.  Your next appointment:   6 month(s)  The format for your next appointment:   In Person  Provider:    You may see Dr. Fletcher Anon or one of the following Advanced Practice Providers on your designated Care Team:    Murray Hodgkins, NP  Christell Faith, PA-C  Marrianne Mood, PA-C   Other Instructions N/A

## 2019-06-07 ENCOUNTER — Ambulatory Visit (INDEPENDENT_AMBULATORY_CARE_PROVIDER_SITE_OTHER): Payer: PPO

## 2019-06-07 ENCOUNTER — Other Ambulatory Visit: Payer: Self-pay

## 2019-06-07 VITALS — Ht 64.5 in | Wt 147.0 lb

## 2019-06-07 DIAGNOSIS — Z0000 Encounter for general adult medical examination without abnormal findings: Secondary | ICD-10-CM | POA: Diagnosis not present

## 2019-06-07 NOTE — Patient Instructions (Addendum)
  Ms. Manjarrez , Thank you for taking time to come for your Medicare Wellness Visit. I appreciate your ongoing commitment to your health goals. Please review the following plan we discussed and let me know if I can assist you in the future.   These are the goals we discussed: Goals      Patient Stated   . Follow up with Primary Care Provider (pt-stated)     As needed       This is a list of the screening recommended for you and due dates:  Health Maintenance  Topic Date Due  . Mammogram  04/13/2018  . Flu Shot  08/02/2019*  . Tetanus Vaccine  06/06/2020*  .  Hepatitis C: One time screening is recommended by Center for Disease Control  (CDC) for  adults born from [birth year] through 1966.   06/06/2020*  . Colon Cancer Screening  10/29/2020  . DEXA scan (bone density measurement)  Completed  . Pneumonia vaccines  Discontinued  *Topic was postponed. The date shown is not the original due date.

## 2019-06-07 NOTE — Progress Notes (Addendum)
Subjective:   Debra Hale is a 69 y.o. female who presents for Medicare Annual (Subsequent) preventive examination.  Review of Systems:  No ROS.  Medicare Wellness Virtual Visit.  Visual/audio telehealth visit, UTA vital signs.   Ht/Wt provided.  See social history for additional risk factors.   Cardiac Risk Factors include: advanced age (>16men, >60 women)     Objective:     Vitals: Ht 5' 4.5" (1.638 m)   Wt 147 lb (66.7 kg)   BMI 24.84 kg/m   Body mass index is 24.84 kg/m.  Advanced Directives 06/07/2019 04/21/2018 10/29/2017 09/08/2016 01/02/2016 12/11/2014 12/11/2014  Does Patient Have a Medical Advance Directive? No No No No No No No  Would patient like information on creating a medical advance directive? No - Patient declined No - Patient declined No - Patient declined Yes (MAU/Ambulatory/Procedural Areas - Information given) No - patient declined information Yes Higher education careers adviser given No - patient declined information    Tobacco Social History   Tobacco Use  Smoking Status Never Smoker  Smokeless Tobacco Never Used     Counseling given: Not Answered   Clinical Intake:  Pre-visit preparation completed: Yes        Diabetes: No  How often do you need to have someone help you when you read instructions, pamphlets, or other written materials from your doctor or pharmacy?: 1 - Never  Interpreter Needed?: No     Past Medical History:  Diagnosis Date  . Anxiety   . Arthritis    Osteoarthritis  . Breast cancer ([place]) 2009   right breast lumpectomy with rad tx  . Colon cancer ([address])    surgery with chemo and rad tx  . GERD (gastroesophageal reflux disease)   . Hypothyroidism   . Liver disease   . Liver nodule    s/p negative biopsy  . Malignant neoplasm of thyroid gland ([place]) 2002   s/p surgery and XRT  . Nephrolithiasis    s/p lithotripsy  . Osteoporosis   . Other and unspecified hyperlipidemia   . Palpitations   . Personal history of  chemotherapy   . Personal history of malignant neoplasm of large intestine    carcinoma - cecum, s/p right laparoscopic colectomy - s/p chemotherapy and XRT  . Personal history of radiation therapy   . Pure hypercholesterolemia   . Unspecified hereditary and idiopathic peripheral neuropathy    Past Surgical History:  Procedure Laterality Date  . APPENDECTOMY  1985  . BREAST BIOPSY Right 2009   positive  . BREAST BIOPSY Right 2009   negative  . BREAST LUMPECTOMY Right 2009   breast cancer  . CHOLECYSTECTOMY  1995  . COLONOSCOPY    . COLONOSCOPY WITH PROPOFOL N/A 10/29/2017   Procedure: COLONOSCOPY WITH PROPOFOL;  Surgeon: Manya Silvas, MD;  Location: [facility] ENDOSCOPY;  Service: Endoscopy;  Laterality: N/A;  . DILATION AND CURETTAGE OF UTERUS  1990  . DILATION AND CURETTAGE, DIAGNOSTIC / THERAPEUTIC  1990  . ESOPHAGOGASTRODUODENOSCOPY    . ESOPHAGOGASTRODUODENOSCOPY (EGD) WITH PROPOFOL N/A 10/29/2017   Procedure: ESOPHAGOGASTRODUODENOSCOPY (EGD) WITH PROPOFOL;  Surgeon: Manya Silvas, MD;  Location: [facility] ENDOSCOPY;  Service: Endoscopy;  Laterality: N/A;  . LAPAROSCOPIC PARTIAL COLECTOMY     stage 3-C carcinoma of the cecum, s/p chemotherapy and xrt  . LITHOTRIPSY    . SIGMOIDOSCOPY  08/26/1993  . THYROID LOBECTOMY  2002   s/p XRT   Family History  Problem Relation Age of Onset  . Stroke  Mother        40s  . Alzheimer's disease Mother   . Cancer Mother   . Lung cancer Father   . Prostate cancer Father   . Cancer Father        Colon  . Colon cancer Father   . Breast cancer Sister        60's  . Lung cancer Sister   . Breast cancer Maternal Aunt    Social History   Socioeconomic History  . Marital status: Divorced    Spouse name: Not on file  . Number of children: 0  . Years of education: Not on file  . Highest education level: Not on file  Occupational History  . Not on file  Tobacco Use  . Smoking status: Never Smoker  . Smokeless tobacco: Never Used    Substance and Sexual Activity  . Alcohol use: No    Alcohol/week: 0.0 standard drinks  . Drug use: No  . Sexual activity: Not Currently  Other Topics Concern  . Not on file  Social History Narrative  . Not on file   Social Determinants of Health   Financial Resource Strain: Low Risk   . Difficulty of Paying Living Expenses: Not hard at all  Food Insecurity: No Food Insecurity  . Worried About Charity fundraiser in the Last Year: Never true  . Ran Out of Food in the Last Year: Never true  Transportation Needs: No Transportation Needs  . Lack of Transportation (Medical): No  . Lack of Transportation (Non-Medical): No  Physical Activity:   . Days of Exercise per Week: Not on file  . Minutes of Exercise per Session: Not on file  Stress: No Stress Concern Present  . Feeling of Stress : Not at all  Social Connections: Unknown  . Frequency of Communication with Friends and Family: More than three times a week  . Frequency of Social Gatherings with Friends and Family: Not on file  . Attends Religious Services: Not on file  . Active Member of Clubs or Organizations: Not on file  . Attends Archivist Meetings: Not on file  . Marital Status: Not on file    Outpatient Encounter Medications as of 06/07/2019  Medication Sig  . clonazePAM (KLONOPIN) 0.5 MG tablet Take 1.5 mg by mouth 2 (two) times daily.   . colestipol (COLESTID) 1 g tablet Take by mouth daily.   Marland Kitchen escitalopram (LEXAPRO) 20 MG tablet Take 20 mg by mouth daily.    Marland Kitchen ibuprofen (ADVIL,MOTRIN) 600 MG tablet Take 1 tablet (600 mg total) by mouth every 6 (six) hours as needed.  Marland Kitchen levothyroxine (SYNTHROID) 100 MCG tablet TAKE 1 TABLET BY MOUTH EVERY DAY  . magnesium oxide (MAG-OX) 400 MG tablet TAKE 1 TABLET BY MOUTH EVERY DAY  . pantoprazole (PROTONIX) 40 MG tablet TAKE 1 TABLET BY MOUTH EVERY DAY  . potassium chloride (KLOR-CON) 10 MEQ tablet TAKE 1 TABLET BY MOUTH EVERY DAY   Facility-Administered Encounter  Medications as of 06/07/2019  Medication  . ondansetron (ZOFRAN) tablet 8 mg    Activities of Daily Living In your present state of health, do you have any difficulty performing the following activities: 06/07/2019  Hearing? N  Vision? N  Difficulty concentrating or making decisions? N  Walking or climbing stairs? N  Dressing or bathing? N  Doing errands, shopping? N  Preparing Food and eating ? N  Using the Toilet? N  In the past six months, have you accidently  leaked urine? N  Do you have problems with loss of bowel control? N  Managing your Medications? N  Managing your Finances? N  Housekeeping or managing your Housekeeping? N  Some recent data might be hidden    Patient Care Team: [name], MD as PCP - General (Internal Medicine)    Assessment:   This is a routine wellness examination for Reni.  Nurse connected with patient 06/07/19 at  2:00 PM EST by a telephone enabled telemedicine application and verified that I am speaking with the correct person using two identifiers. Patient stated full name and DOB. Patient gave permission to continue with virtual visit. Patient's location was at home and Nurse's location was at [place] office.   Patient is alert and oriented x3. Patient denies difficulty focusing or concentrating.  Health Maintenance Due: -Influenza and TDAP vaccine- declined -Hepatitis C Screening- declined See completed HM at the end of note.   Eye: Visual acuity not assessed. Virtual visit. Followed by their ophthalmologist.  Dental: UTD.  Hearing: Demonstrates normal hearing during visit.  Safety:  Patient feels safe at home- yes Patient does have smoke detectors at home- yes Patient does wear sunscreen or protective clothing when in direct sunlight - yes Patient does wear seat belt when in a moving vehicle - yes Patient drives- yes Adequate lighting in walkways free from debris- yes Grab bars and handrails used as appropriate-  yes Ambulates with an assistive device- no Cell phone on person when ambulating outside of the home- yes  Social: Alcohol intake - no       Smoking history- never   Smokers in home? none Illicit drug use? none  Medication: Taking as directed and without issues.  Self managed - yes   Covid-19: Precautions and sickness symptoms discussed. Wears mask, social distancing, hand hygiene as appropriate.   Activities of Daily Living Patient denies needing assistance with: household chores, feeding themselves, getting from bed to chair, getting to the toilet, bathing/showering, dressing, managing money, or preparing meals.   Discussed the importance of a healthy diet, water intake and the benefits of aerobic exercise.   Physical activity- walking as tolerated.   Diet:  Regular Water: fair intake  Other Providers Patient Care Team: [name], MD as PCP - General (Internal Medicine)  Exercise Activities and Dietary recommendations Current Exercise Habits: The patient does not participate in regular exercise at present  Goals      Patient Stated   . Follow up with Primary Care Provider (pt-stated)     As needed       Fall Risk Fall Risk  06/07/2019 09/14/2017 09/08/2016 03/19/2016 01/28/2015  Falls in the past year? 0 No No No No  Follow up Falls evaluation completed - - - -   Timed Get Up and Go performed: no, virtual visit  Depression Screen PHQ 2/9 Scores 06/07/2019 05/02/2018 04/18/2018 09/08/2016  PHQ - 2 Score - 6 6 -  PHQ- 9 Score - 24 22 -  Exception Documentation (No Data) - - Other- indicate reason in comment box  Not completed - - - Currently in treatment with Dr. Camelia Eng every 4-6 weeks     Cognitive Function MMSE - [place] Exam 09/08/2016  Orientation to time 5  Orientation to Place 5  Registration 3  Attention/ Calculation 5  Recall 3  Language- name 2 objects 2  Language- repeat 1  Language- follow 3 step command 3  Language- read & follow  direction 1  Write a  sentence 1  Copy design 1  Total score 30     6CIT Screen 06/07/2019  What Year? 0 points  What month? 0 points  What time? 0 points  Count back from 20 0 points  Months in reverse 0 points    Immunization History  Administered Date(s) Administered  . Hep A / Hep B 10/16/2013  . Influenza Split 02/04/2012  . Influenza, High Dose Seasonal PF 03/02/2017  . Influenza,inj,Quad PF,6+ Mos 01/23/2014, 06/09/2016  . Pneumococcal Conjugate-13 01/05/2012   Screening Tests Health Maintenance  Topic Date Due  . MAMMOGRAM  04/13/2018  . INFLUENZA VACCINE  08/02/2019 (Originally 12/03/2018)  . TETANUS/TDAP  06/06/2020 (Originally 11/15/1969)  . Hepatitis C Screening  06/06/2020 (Originally 01-23-1951)  . COLONOSCOPY  10/29/2020  . DEXA SCAN  Completed  . PNA vac Low Risk Adult  Discontinued      Plan:   Keep all routine maintenance appointments.   06/16/19 follow up for increased gas when eating but not limited to. No change in diet. Prefers in office visit for possible lab work. Denies covid symptoms or testing.   Medicare Attestation I have personally reviewed: The patient's medical and social history Their use of alcohol, tobacco or illicit drugs Their current medications and supplements The patient's functional ability including ADLs,fall risks, home safety risks, cognitive, and hearing and visual impairment Diet and physical activities Evidence for depression   I have reviewed and discussed with patient certain preventive protocols, quality metrics, and best practice recommendations.     Varney Biles, LPN  [id number]   Reviewed above information.  Will f/u at appt regarding concerns.  Agree with assessment and plan.   Dr Nicki Reaper

## 2019-06-16 ENCOUNTER — Encounter: Payer: Self-pay | Admitting: Internal Medicine

## 2019-06-16 ENCOUNTER — Other Ambulatory Visit: Payer: Self-pay

## 2019-06-16 ENCOUNTER — Ambulatory Visit (INDEPENDENT_AMBULATORY_CARE_PROVIDER_SITE_OTHER): Payer: PPO | Admitting: Internal Medicine

## 2019-06-16 DIAGNOSIS — F419 Anxiety disorder, unspecified: Secondary | ICD-10-CM

## 2019-06-16 DIAGNOSIS — R195 Other fecal abnormalities: Secondary | ICD-10-CM | POA: Diagnosis not present

## 2019-06-16 DIAGNOSIS — E039 Hypothyroidism, unspecified: Secondary | ICD-10-CM | POA: Diagnosis not present

## 2019-06-16 DIAGNOSIS — Z853 Personal history of malignant neoplasm of breast: Secondary | ICD-10-CM | POA: Diagnosis not present

## 2019-06-16 DIAGNOSIS — Z85850 Personal history of malignant neoplasm of thyroid: Secondary | ICD-10-CM

## 2019-06-16 DIAGNOSIS — Z9109 Other allergy status, other than to drugs and biological substances: Secondary | ICD-10-CM | POA: Diagnosis not present

## 2019-06-16 DIAGNOSIS — Z85038 Personal history of other malignant neoplasm of large intestine: Secondary | ICD-10-CM

## 2019-06-16 DIAGNOSIS — E7800 Pure hypercholesterolemia, unspecified: Secondary | ICD-10-CM | POA: Diagnosis not present

## 2019-06-16 DIAGNOSIS — F439 Reaction to severe stress, unspecified: Secondary | ICD-10-CM

## 2019-06-16 DIAGNOSIS — M810 Age-related osteoporosis without current pathological fracture: Secondary | ICD-10-CM

## 2019-06-16 DIAGNOSIS — M25551 Pain in right hip: Secondary | ICD-10-CM

## 2019-06-16 DIAGNOSIS — K219 Gastro-esophageal reflux disease without esophagitis: Secondary | ICD-10-CM | POA: Diagnosis not present

## 2019-06-16 DIAGNOSIS — F320 Major depressive disorder, single episode, mild: Secondary | ICD-10-CM

## 2019-06-16 DIAGNOSIS — D72819 Decreased white blood cell count, unspecified: Secondary | ICD-10-CM | POA: Diagnosis not present

## 2019-06-16 DIAGNOSIS — K625 Hemorrhage of anus and rectum: Secondary | ICD-10-CM

## 2019-06-16 DIAGNOSIS — R945 Abnormal results of liver function studies: Secondary | ICD-10-CM | POA: Diagnosis not present

## 2019-06-16 DIAGNOSIS — E559 Vitamin D deficiency, unspecified: Secondary | ICD-10-CM

## 2019-06-16 DIAGNOSIS — R7989 Other specified abnormal findings of blood chemistry: Secondary | ICD-10-CM

## 2019-06-16 DIAGNOSIS — F32A Depression, unspecified: Secondary | ICD-10-CM

## 2019-06-16 NOTE — Progress Notes (Signed)
Patient ID: Debra Hale, female   DOB: 12-Feb-1951, 69 y.o.   MRN: [id number]   Virtual Visit via video Note  This visit type was conducted due to national recommendations for restrictions regarding the COVID-19 pandemic (e.g. social distancing).  This format is felt to be most appropriate for this patient at this time.  All issues noted in this document were discussed and addressed.  No physical exam was performed (except for noted visual exam findings with Video Visits).   I connected with [name] by a video enabled telemedicine application and verified that I am speaking with the correct person using two identifiers. Location patient: home Location provider: work  Persons participating in the virtual visit: patient, provider  I discussed the limitations, risks, security and privacy concerns of performing an evaluation and management service by video and the availability of in person appointments. The patient expressed understanding and agreed to proceed.   Reason for visit: scheduled follow up.    HPI: She reports she is doing relatively well.  Tries to stay active. No chest pain.  Saw cardiology 06/01/19 - stable.  Recommended f/u in 6 months.  Has seen ortho for right hip pain.  S/p injection and physical therapy.  Persistent pain.  Affects her walking.  Request referral to ortho for second opinion.  Discussed obtaining bone density.  She is agreeable to schedule.  No increased cough or congestion.  Increased gas.  Notices more after eating.  No abdominal pain.  No nausea or vomiting.  Bowels moving.  Did have an episode of rectal bleeding 04/07/19. Firm bowel movement and noticed bright red blood after this bowel movement.  Has not noticed any bleeding since.  Some intermittent loose stool.  No acid reflux - if takes protonix.  If skips a dose, will notice reflux.  Handling stress.     ROS: See pertinent positives and negatives per HPI.  Past Medical History:  Diagnosis Date  .  Anxiety   . Arthritis    Osteoarthritis  . Breast cancer ([place]) 2009   right breast lumpectomy with rad tx  . Colon cancer ([place])    surgery with chemo and rad tx  . GERD (gastroesophageal reflux disease)   . Hypothyroidism   . Liver disease   . Liver nodule    s/p negative biopsy  . Malignant neoplasm of thyroid gland ([place]) 2002   s/p surgery and XRT  . Nephrolithiasis    s/p lithotripsy  . Osteoporosis   . Other and unspecified hyperlipidemia   . Palpitations   . Personal history of chemotherapy   . Personal history of malignant neoplasm of large intestine    carcinoma - cecum, s/p right laparoscopic colectomy - s/p chemotherapy and XRT  . Personal history of radiation therapy   . Pure hypercholesterolemia   . Unspecified hereditary and idiopathic peripheral neuropathy     Past Surgical History:  Procedure Laterality Date  . APPENDECTOMY  1985  . BREAST BIOPSY Right 2009   positive  . BREAST BIOPSY Right 2009   negative  . BREAST LUMPECTOMY Right 2009   breast cancer  . CHOLECYSTECTOMY  1995  . COLONOSCOPY    . COLONOSCOPY WITH PROPOFOL N/A 10/29/2017   Procedure: COLONOSCOPY WITH PROPOFOL;  Surgeon: Manya Silvas, MD;  Location: [facility] ENDOSCOPY;  Service: Endoscopy;  Laterality: N/A;  . DILATION AND CURETTAGE OF UTERUS  1990  . DILATION AND CURETTAGE, DIAGNOSTIC / THERAPEUTIC  1990  . ESOPHAGOGASTRODUODENOSCOPY    .  ESOPHAGOGASTRODUODENOSCOPY (EGD) WITH PROPOFOL N/A 10/29/2017   Procedure: ESOPHAGOGASTRODUODENOSCOPY (EGD) WITH PROPOFOL;  Surgeon: Manya Silvas, MD;  Location: [facility] ENDOSCOPY;  Service: Endoscopy;  Laterality: N/A;  . LAPAROSCOPIC PARTIAL COLECTOMY     stage 3-C carcinoma of the cecum, s/p chemotherapy and xrt  . LITHOTRIPSY    . SIGMOIDOSCOPY  08/26/1993  . THYROID LOBECTOMY  2002   s/p XRT    Family History  Problem Relation Age of Onset  . Stroke Mother        40s  . Alzheimer's disease Mother   . Cancer Mother   . Lung cancer Father    . Prostate cancer Father   . Cancer Father        Colon  . Colon cancer Father   . Breast cancer Sister        47's  . Lung cancer Sister   . Breast cancer Maternal Aunt     SOCIAL HX: reviewed.    Current Outpatient Medications:  .  clonazePAM (KLONOPIN) 0.5 MG tablet, Take 1.5 mg by mouth 2 (two) times daily. , Disp: , Rfl: 0 .  colestipol (COLESTID) 1 g tablet, Take by mouth daily. , Disp: , Rfl:  .  escitalopram (LEXAPRO) 20 MG tablet, Take 20 mg by mouth daily.  , Disp: , Rfl:  .  ibuprofen (ADVIL,MOTRIN) 600 MG tablet, Take 1 tablet (600 mg total) by mouth every 6 (six) hours as needed., Disp: 30 tablet, Rfl: 0 .  levothyroxine (SYNTHROID) 100 MCG tablet, TAKE 1 TABLET BY MOUTH EVERY DAY, Disp: 90 tablet, Rfl: 3 .  magnesium oxide (MAG-OX) 400 MG tablet, TAKE 1 TABLET BY MOUTH EVERY DAY, Disp: 90 tablet, Rfl: 1 .  pantoprazole (PROTONIX) 40 MG tablet, TAKE 1 TABLET BY MOUTH EVERY DAY, Disp: 90 tablet, Rfl: 2 .  potassium chloride (KLOR-CON) 10 MEQ tablet, TAKE 1 TABLET BY MOUTH EVERY DAY, Disp: 90 tablet, Rfl: 1  Current Facility-Administered Medications:  .  ondansetron (ZOFRAN) tablet 8 mg, 8 mg, Oral, Once, Guse, Lauren M, FNP  EXAM:  GENERAL: alert, oriented, appears well and in no acute distress  HEENT: atraumatic, conjunttiva clear, no obvious abnormalities on inspection of external nose and ears  NECK: normal movements of the head and neck  LUNGS: on inspection no signs of respiratory distress, breathing rate appears normal, no obvious gross SOB, gasping or wheezing  CV: no obvious cyanosis  PSYCH/NEURO: pleasant and cooperative, no obvious depression or anxiety, speech and thought processing grossly intact  ASSESSMENT AND PLAN:  Discussed the following assessment and plan:  Abnormal liver function test Has been worked up by GI.  Fatty liver.  Had ultrasound and fibroscan - diffuse hepatic steatosis and moderate risk for fibrosis.  Diet, exercise and weight  loss.  Follow liver function tests.    Anxiety Being followed by psychiatry - Dr Clovis Riley.  Stable.  Continue current medication regimen.  Follow.    History of thyroid cancer On thyroid replacement.  Follow tsh.    Environmental allergies Controlled.    GERD (gastroesophageal reflux disease) Controlled if takes protonix.  If skips medication, notices acid reflux.  Continue protonix.  F/u with GI as outlined.    History of breast cancer Mammogram ordered.  Need to schedule.    History of malignant neoplasm of large intestine Is overdue f/u colonoscopy.  Had the episode of rectal bleeding as outlined.  Increased gas.  Refer to GI.  Will also have them evaluate given acid reflux if  skips dose of medication.    Hypercholesterolemia Low cholesterol diet and exercise.  Follow lipid panel.    Hypothyroidism On thyroid replacement.  Follow tsh.   Leukopenia Follow cbc.   Loose stools Loose stool as outlined.  One episode of rectal bleeding as outlined.  Probiotic daily.  Refer to GI.    Mild depression ([facility]) Seeing Dr Clovis Riley.  Stable.    Osteoporosis Previously treated with aromasin.  Off now.  Schedule f/u bone density.   Right hip pain Persistent hip pain despite physical therapy and injection.  Request second opinion.  Refer to ortho.    Stress Followed by psychiatry.  Stable.   Vitamin D deficiency Follow vitamin D level.   Rectal bleed Rectal bleeding as outlined.  Increased gas.  Loose stools.  Probiotic daily.  Schedule appt with GI.     Orders Placed This Encounter  Procedures  . CBC with Differential/Platelet    Standing Status:   Future    Standing Expiration Date:   06/16/2020  . Hepatic function panel    Standing Status:   Future    Standing Expiration Date:   06/16/2020  . Lipid panel    Standing Status:   Future    Standing Expiration Date:   06/16/2020  . TSH    Standing Status:   Future    Standing Expiration Date:   06/16/2020  . Basic metabolic  panel    Standing Status:   Future    Standing Expiration Date:   06/16/2020  . Ambulatory referral to Gastroenterology    Referral Priority:   Routine    Referral Type:   Consultation    Referral Reason:   Specialty Services Required    Number of Visits Requested:   1  . Ambulatory referral to Orthopedic Surgery    Referral Priority:   Routine    Referral Type:   Surgical    Referral Reason:   Specialty Services Required    Requested Specialty:   Orthopedic Surgery    Number of Visits Requested:   1    I discussed the assessment and treatment plan with the patient. The patient was provided an opportunity to ask questions and all were answered. The patient agreed with the plan and demonstrated an understanding of the instructions.   The patient was advised to call back or seek an in-person evaluation if the symptoms worsen or if the condition fails to improve as anticipated.   [name], MD

## 2019-06-17 ENCOUNTER — Encounter: Payer: Self-pay | Admitting: Internal Medicine

## 2019-06-17 DIAGNOSIS — K625 Hemorrhage of anus and rectum: Secondary | ICD-10-CM | POA: Insufficient documentation

## 2019-06-17 NOTE — Assessment & Plan Note (Signed)
Persistent hip pain despite physical therapy and injection.  Request second opinion.  Refer to ortho.

## 2019-06-17 NOTE — Assessment & Plan Note (Signed)
Previously treated with aromasin.  Off now.  Schedule f/u bone density.

## 2019-06-17 NOTE — Assessment & Plan Note (Signed)
Has been worked up by GI.  Fatty liver.  Had ultrasound and fibroscan - diffuse hepatic steatosis and moderate risk for fibrosis.  Diet, exercise and weight loss.  Follow liver function tests.

## 2019-06-17 NOTE — Assessment & Plan Note (Signed)
On thyroid replacement.  Follow tsh.  

## 2019-06-17 NOTE — Assessment & Plan Note (Signed)
Follow cbc.  

## 2019-06-17 NOTE — Assessment & Plan Note (Signed)
Mammogram ordered.  Need to schedule.

## 2019-06-17 NOTE — Assessment & Plan Note (Signed)
Is overdue f/u colonoscopy.  Had the episode of rectal bleeding as outlined.  Increased gas.  Refer to GI.  Will also have them evaluate given acid reflux if skips dose of medication.

## 2019-06-17 NOTE — Assessment & Plan Note (Signed)
Seeing Dr Levine.  Stable.   

## 2019-06-17 NOTE — Assessment & Plan Note (Signed)
Being followed by psychiatry - Dr Clovis Riley.  Stable.  Continue current medication regimen.  Follow.

## 2019-06-17 NOTE — Assessment & Plan Note (Signed)
Controlled.  

## 2019-06-17 NOTE — Assessment & Plan Note (Signed)
Loose stool as outlined.  One episode of rectal bleeding as outlined.  Probiotic daily.  Refer to GI.

## 2019-06-17 NOTE — Assessment & Plan Note (Signed)
Rectal bleeding as outlined.  Increased gas.  Loose stools.  Probiotic daily.  Schedule appt with GI.

## 2019-06-17 NOTE — Assessment & Plan Note (Signed)
Followed by psychiatry.  Stable.  

## 2019-06-17 NOTE — Assessment & Plan Note (Signed)
Follow vitamin D level.  

## 2019-06-17 NOTE — Assessment & Plan Note (Signed)
Low cholesterol diet and exercise.  Follow lipid panel.   

## 2019-06-17 NOTE — Assessment & Plan Note (Signed)
Controlled if takes protonix.  If skips medication, notices acid reflux.  Continue protonix.  F/u with GI as outlined.

## 2019-07-10 DIAGNOSIS — M1611 Unilateral primary osteoarthritis, right hip: Secondary | ICD-10-CM | POA: Insufficient documentation

## 2019-07-20 ENCOUNTER — Other Ambulatory Visit: Payer: Self-pay | Admitting: Internal Medicine

## 2019-07-20 DIAGNOSIS — Z85038 Personal history of other malignant neoplasm of large intestine: Secondary | ICD-10-CM | POA: Diagnosis not present

## 2019-07-20 DIAGNOSIS — Z8679 Personal history of other diseases of the circulatory system: Secondary | ICD-10-CM | POA: Diagnosis not present

## 2019-07-20 DIAGNOSIS — K219 Gastro-esophageal reflux disease without esophagitis: Secondary | ICD-10-CM | POA: Diagnosis not present

## 2019-07-20 DIAGNOSIS — K760 Fatty (change of) liver, not elsewhere classified: Secondary | ICD-10-CM | POA: Diagnosis not present

## 2019-07-20 DIAGNOSIS — Z85850 Personal history of malignant neoplasm of thyroid: Secondary | ICD-10-CM | POA: Diagnosis not present

## 2019-07-20 DIAGNOSIS — K648 Other hemorrhoids: Secondary | ICD-10-CM | POA: Diagnosis not present

## 2019-07-20 DIAGNOSIS — Z86010 Personal history of colonic polyps: Secondary | ICD-10-CM | POA: Diagnosis not present

## 2019-07-20 DIAGNOSIS — Z853 Personal history of malignant neoplasm of breast: Secondary | ICD-10-CM | POA: Diagnosis not present

## 2019-07-24 DIAGNOSIS — M25551 Pain in right hip: Secondary | ICD-10-CM | POA: Diagnosis not present

## 2019-07-24 DIAGNOSIS — M1611 Unilateral primary osteoarthritis, right hip: Secondary | ICD-10-CM | POA: Diagnosis not present

## 2019-07-24 DIAGNOSIS — G8929 Other chronic pain: Secondary | ICD-10-CM | POA: Diagnosis not present

## 2019-07-26 ENCOUNTER — Telehealth: Payer: Self-pay | Admitting: Internal Medicine

## 2019-07-26 NOTE — Telephone Encounter (Signed)
LMTCB and schedule a follow up appt in May/June and labs before appt

## 2019-08-01 DIAGNOSIS — G8929 Other chronic pain: Secondary | ICD-10-CM | POA: Diagnosis not present

## 2019-08-01 DIAGNOSIS — M25551 Pain in right hip: Secondary | ICD-10-CM | POA: Diagnosis not present

## 2019-08-01 DIAGNOSIS — M1611 Unilateral primary osteoarthritis, right hip: Secondary | ICD-10-CM | POA: Diagnosis not present

## 2019-08-02 ENCOUNTER — Telehealth: Payer: Self-pay | Admitting: Cardiovascular Disease

## 2019-08-02 NOTE — Telephone Encounter (Signed)
   [facility] Pre-operative Risk Assessment    Request for surgical clearance:  1. What type of surgery is being performed? COLONOSCOPY   2. When is this surgery scheduled? 10/18/19  3. What type of clearance is required (medical clearance vs. Pharmacy clearance to hold med vs. Both)? NOT LISTED  4. Are there any medications that need to be held prior to surgery and how long? NOT LISTED  5. Practice name and name of physician performing surgery? [facility], [place], [place]  What is your office phone number [phone]   7.   What is your office fax number [phone]  8.   Anesthesia type (None, local, MAC, general) ? MONITORED   Elissa Hefty 08/02/2019, 9:12 AM  _________________________________________________________________   (provider comments below)

## 2019-08-02 NOTE — Telephone Encounter (Signed)
   Primary Cardiologist: No primary care provider on file.  Chart reviewed as part of pre-operative protocol coverage. Given past medical history and time since last visit, based on ACC/AHA guidelines, Debra Hale would be at acceptable risk for the planned procedure without further cardiovascular testing.   Has follow up appointment with cardiology on 12/05/2019. Spoke with her about clearance and follow up.   I will route this recommendation to the requesting party via Epic fax function and remove from pre-op pool.  Please call with questions.  Phill Myron.  DNP, ANP, AACC  08/02/2019, 10:00 AM

## 2019-08-08 DIAGNOSIS — M25551 Pain in right hip: Secondary | ICD-10-CM | POA: Diagnosis not present

## 2019-08-08 DIAGNOSIS — M1611 Unilateral primary osteoarthritis, right hip: Secondary | ICD-10-CM | POA: Diagnosis not present

## 2019-08-08 DIAGNOSIS — G8929 Other chronic pain: Secondary | ICD-10-CM | POA: Diagnosis not present

## 2019-08-25 DIAGNOSIS — M545 Low back pain: Secondary | ICD-10-CM | POA: Diagnosis not present

## 2019-08-25 DIAGNOSIS — S39012A Strain of muscle, fascia and tendon of lower back, initial encounter: Secondary | ICD-10-CM | POA: Diagnosis not present

## 2019-08-25 DIAGNOSIS — M47816 Spondylosis without myelopathy or radiculopathy, lumbar region: Secondary | ICD-10-CM | POA: Diagnosis not present

## 2019-08-25 DIAGNOSIS — M1611 Unilateral primary osteoarthritis, right hip: Secondary | ICD-10-CM | POA: Diagnosis not present

## 2019-09-13 ENCOUNTER — Ambulatory Visit (INDEPENDENT_AMBULATORY_CARE_PROVIDER_SITE_OTHER): Payer: PPO | Admitting: Family

## 2019-09-13 ENCOUNTER — Encounter: Payer: Self-pay | Admitting: Family

## 2019-09-13 ENCOUNTER — Other Ambulatory Visit: Payer: Self-pay

## 2019-09-13 VITALS — BP 130/88 | HR 71 | Ht 64.0 in | Wt 151.0 lb

## 2019-09-13 DIAGNOSIS — I319 Disease of pericardium, unspecified: Secondary | ICD-10-CM | POA: Diagnosis not present

## 2019-09-13 DIAGNOSIS — R002 Palpitations: Secondary | ICD-10-CM

## 2019-09-13 DIAGNOSIS — M25551 Pain in right hip: Secondary | ICD-10-CM | POA: Diagnosis not present

## 2019-09-13 DIAGNOSIS — Z01810 Encounter for preprocedural cardiovascular examination: Secondary | ICD-10-CM

## 2019-09-13 DIAGNOSIS — M1611 Unilateral primary osteoarthritis, right hip: Secondary | ICD-10-CM | POA: Diagnosis not present

## 2019-09-13 NOTE — Patient Instructions (Addendum)
Medication Instructions:  No medication changes today.   *If you need a refill on your cardiac medications before your next appointment, please call your pharmacy*  Lab Work: No lab work today.  Testing/Procedures: Your EKG today shows normal sinus rhythm.   Your physician has requested that you have a lexiscan myoview. For further information please visit [email or web address]. Please follow instruction sheet, as given.  Follow-Up: At [facility] you and your health needs are our priority.  As part of our continuing mission to provide you with exceptional heart care, we have created designated Provider Care Teams.  These Care Teams include your primary Cardiologist (physician) and Advanced Practice Providers (APPs -  Physician Assistants and Nurse Practitioners) who all work together to provide you with the care you need, when you need it.  We recommend signing up for the patient portal called "MyChart".  Sign up information is provided on this After Visit Summary.  MyChart is used to connect with patients for Virtual Visits (Telemedicine).  Patients are able to view lab/test results, encounter notes, upcoming appointments, etc.  Non-urgent messages can be sent to your provider as well.   To learn more about what you can do with MyChart, go to [email or web address].    Your next appointment:   6 month(s)  The format for your next appointment:   In Person  Provider:    You may see Kathlyn Sacramento, MD or one of the following Advanced Practice Providers on your designated Care Team:    Murray Hodgkins, NP  Christell Faith, PA-C  Marrianne Mood, PA-C  Other Instructions  We will plan to do a stress test also called a Lexiscan Myoview. This will make sure you are low risk for your upcoming hip surgery. If this test comes back normal you may proceed with your surgery. If the test comes back abnormal, we will discuss with next steps. We do this test because it is difficult to assess  your heart's response to stress due to your hip pain.     [place]  Your caregiver has ordered a Stress Test with nuclear imaging. The purpose of this test is to evaluate the blood supply to your heart muscle. This procedure is referred to as a "Non-Invasive Stress Test." This is because other than having an IV started in your vein, nothing is inserted or "invades" your body. Cardiac stress tests are done to find areas of poor blood flow to the heart by determining the extent of coronary artery disease (CAD). Some patients exercise on a treadmill, which naturally increases the blood flow to your heart, while others who are  unable to walk on a treadmill due to physical limitations have a pharmacologic/chemical stress agent called Lexiscan . This medicine will mimic walking on a treadmill by temporarily increasing your coronary blood flow.   Please note: these test may take anywhere between 2-4 hours to complete  PLEASE REPORT TO [place] AT THE FIRST DESK WILL DIRECT YOU WHERE TO GO  Date of Procedure:_____________________________________  Arrival Time for Procedure:______________________________  Instructions regarding medication:   Take your medications as ordered.   PLEASE NOTIFY THE OFFICE AT LEAST 30 HOURS IN ADVANCE IF YOU ARE UNABLE TO KEEP YOUR APPOINTMENT.  [phone] AND  PLEASE NOTIFY NUCLEAR MEDICINE AT [facility] AT LEAST 24 HOURS IN ADVANCE IF YOU ARE UNABLE TO KEEP YOUR APPOINTMENT. [phone]  How to prepare for your Myoview test:  1. Do not eat or drink after midnight 2.  No caffeine for 24 hours prior to test 3. No smoking 24 hours prior to test. 4. Your medication may be taken with water.  If your doctor stopped a medication because of this test, do not take that medication. 5. Ladies, please do not wear dresses.  Skirts or pants are appropriate. Please wear a short sleeve shirt. 6. No perfume, cologne or lotion. 7. Wear  comfortable walking shoes. No heels!

## 2019-09-13 NOTE — Progress Notes (Signed)
Office Visit    Patient Name: Debra Hale Date of Encounter: 09/13/2019  Primary Care Provider:  [name], MD Primary Cardiologist:  Kathlyn Sacramento, MD Electrophysiologist:  None   Chief Complaint    KAMORRA ALLERY is a 69 y.o. female with a hx of recurrent pericarditis, HLD, thyroid carcinoma s/p surgery and XRT, colon cancer s/p laparoscopic right colectomy, nephrolithiasis, anxiety, OP, breast cancer, palpitations in the setting of hypokalemia/hypomagnesia presents today for cardiac clearance for procedure.    Past Medical History    Past Medical History:  Diagnosis Date  . Anxiety   . Arthritis    Osteoarthritis  . Breast cancer ([place]) 2009   right breast lumpectomy with rad tx  . Colon cancer ([place])    surgery with chemo and rad tx  . GERD (gastroesophageal reflux disease)   . Hypothyroidism   . Liver disease   . Liver nodule    s/p negative biopsy  . Malignant neoplasm of thyroid gland ([place]) 2002   s/p surgery and XRT  . Nephrolithiasis    s/p lithotripsy  . Osteoporosis   . Other and unspecified hyperlipidemia   . Palpitations   . Personal history of chemotherapy   . Personal history of malignant neoplasm of large intestine    carcinoma - cecum, s/p right laparoscopic colectomy - s/p chemotherapy and XRT  . Personal history of radiation therapy   . Pure hypercholesterolemia   . Unspecified hereditary and idiopathic peripheral neuropathy    Past Surgical History:  Procedure Laterality Date  . APPENDECTOMY  1985  . BREAST BIOPSY Right 2009   positive  . BREAST BIOPSY Right 2009   negative  . BREAST LUMPECTOMY Right 2009   breast cancer  . CHOLECYSTECTOMY  1995  . COLONOSCOPY    . COLONOSCOPY WITH PROPOFOL N/A 10/29/2017   Procedure: COLONOSCOPY WITH PROPOFOL;  Surgeon: Manya Silvas, MD;  Location: [facility] ENDOSCOPY;  Service: Endoscopy;  Laterality: N/A;  . DILATION AND CURETTAGE OF UTERUS  1990  . DILATION AND CURETTAGE, DIAGNOSTIC /  THERAPEUTIC  1990  . ESOPHAGOGASTRODUODENOSCOPY    . ESOPHAGOGASTRODUODENOSCOPY (EGD) WITH PROPOFOL N/A 10/29/2017   Procedure: ESOPHAGOGASTRODUODENOSCOPY (EGD) WITH PROPOFOL;  Surgeon: Manya Silvas, MD;  Location: [facility] ENDOSCOPY;  Service: Endoscopy;  Laterality: N/A;  . LAPAROSCOPIC PARTIAL COLECTOMY     stage 3-C carcinoma of the cecum, s/p chemotherapy and xrt  . LITHOTRIPSY    . SIGMOIDOSCOPY  08/26/1993  . THYROID LOBECTOMY  2002   s/p XRT   Allergies  Allergies  Allergen Reactions  . Demeclocycline Other (See Comments)    Throat swells  . Tetracyclines & Related Other (See Comments)    Throat swells   . Augmentin [Amoxicillin-Pot Clavulanate] Diarrhea  . Bentyl [Dicyclomine Hcl]   . Ciprofloxacin Diarrhea  . Codeine Other (See Comments)    dizziness    . Epinephrine   . Flagyl [Metronidazole]   . Librax [Chlordiazepoxide-Clidinium]   . Phenobarbital   . Prednisone     Nervous    . Ultram [Tramadol] Other (See Comments)    Sick feeling    History of Present Illness    Debra Hale is a 69 y.o. female with a hx of recurrent pericarditis, HLD, thyroid carcinoma s/p surgery and XRT, colon cancer s/p laparoscopic right colectomy, nephrolithiasis, anxiety, OP, breast cancer, palpitations in the setting of hypokalemia/hypomagnesia. She was last seen 05/22/19 by Dr. Fletcher Anon.  Previous treadmill stress test 2017 with no evidence of ischemia  with poor exercise tolerance and hypertensive respones to exercise. She had recurrent pericarditis and previous small pericardial effusion since 2017. Had suspected pericarditis in setting of URI in 2020. Sedimentation rate up to 70. Echo 05/2019 with LVEF 50-55% without pericardial effusion. Treated with colchicine and NSAIDS.   Reports no shortness of breath nor dyspnea on exertion. No edema, orthopnea, PND.   Reports occasional palpitations that have been ongoing for many years and are stable at her baseline. Avoids "stimulants"  such as caffeine and prednisone per her report as she notices they are better controlled.   Tells me her pain in her hip significantly limits her mobility. Exercise tolerance of <4 Mets. She reports an occasional fleeting chest pain that she attributes to anxiety.   EKGs/Labs/Other Studies Reviewed:   The following studies were reviewed today: ETT 2016-05-08  Blood pressure demonstrated a hypertensive response to exercise.  There was no ST segment deviation noted during stress.  No T wave inversion was noted during stress.   Normal treadmill stress test with no evidence of ischemia. Poor exercise capacity with hypertensive response to exercise  Echo 05/2018 - Left ventricle: The cavity size was normal. There was mild    concentric hypertrophy. Systolic function was normal. The    estimated ejection fraction was in the range of 50% to 55%. Wall    motion was normal; there were no regional wall motion    abnormalities. Doppler parameters are consistent with abnormal    left ventricular relaxation (grade 1 diastolic dysfunction).  - Pericardium, extracardiac: There was no pericardial effusion.    EKG:  EKG is ordered today.  The ekg ordered today demonstrates NSR 71 bpm with low voltage QRS and nonspecific ST/T wave changes. Overall flat t-waves. No acute ST/T wave changes. Stable compared to previous.   Recent Labs: 11/25/2018: ALT 33; BUN 19; Creatinine, Ser 0.99; Hemoglobin 14.9; Platelets 182.0; Potassium 3.9; Sodium 143; TSH 1.55  Recent Lipid Panel    Component Value Date/Time   CHOL 171 11/25/2018 1025   TRIG 103.0 11/25/2018 1025   HDL 51.80 11/25/2018 1025   CHOLHDL 3 11/25/2018 1025   VLDL 20.6 11/25/2018 1025   LDLCALC 99 11/25/2018 1025   LDLDIRECT 126.0 07/11/2015 0956    Home Medications   Current Meds  Medication Sig  . clonazePAM (KLONOPIN) 0.5 MG tablet Take 1.5 mg by mouth 2 (two) times daily.   . colestipol (COLESTID) 1 g tablet Take by mouth daily.   Marland Kitchen  escitalopram (LEXAPRO) 20 MG tablet Take 20 mg by mouth daily.    Marland Kitchen ibuprofen (ADVIL,MOTRIN) 600 MG tablet Take 1 tablet (600 mg total) by mouth every 6 (six) hours as needed.  Marland Kitchen levothyroxine (SYNTHROID) 100 MCG tablet TAKE 1 TABLET BY MOUTH EVERY DAY  . magnesium oxide (MAG-OX) 400 MG tablet TAKE 1 TABLET BY MOUTH EVERY DAY  . pantoprazole (PROTONIX) 40 MG tablet TAKE 1 TABLET BY MOUTH EVERY DAY  . potassium chloride (KLOR-CON) 10 MEQ tablet TAKE 1 TABLET BY MOUTH EVERY DAY   Current Facility-Administered Medications for the 09/13/19 encounter (Office Visit) with Loel Dubonnet, NP  Medication  . ondansetron (ZOFRAN) tablet 8 mg    Review of Systems      Review of Systems  Constitution: Negative for chills, fever and malaise/fatigue.  Cardiovascular: Positive for chest pain ("occasional") and palpitations. Negative for dyspnea on exertion, leg swelling, near-syncope, orthopnea and syncope.  Respiratory: Negative for cough, shortness of breath and wheezing.   Musculoskeletal: Positive  for joint pain (R hip).  Gastrointestinal: Negative for nausea and vomiting.  Neurological: Negative for dizziness, light-headedness and weakness.   All other systems reviewed and are otherwise negative except as noted above.  Physical Exam    VS:  BP 130/88 (BP Location: Left Arm, Patient Position: Sitting, Cuff Size: Normal)   Pulse 71   Ht 5\' 4"  (1.626 m)   Wt 151 lb (68.5 kg)   SpO2 99%   BMI 25.92 kg/m  , BMI Body mass index is 25.92 kg/m. GEN: Well nourished, well developed, in no acute distress. HEENT: normal. Neck: Supple, no JVD, carotid bruits, or masses. Cardiac: RRR, no murmurs, rubs, or gallops. No clubbing, cyanosis, edema.  Radials/DP/PT 2+ and equal bilaterally.  Respiratory:  Respirations regular and unlabored, clear to auscultation bilaterally. GI: Soft, nontender, nondistended, BS + x 4. MS: No deformity or atrophy. Skin: Warm and dry, no rash. Neuro:  Strength and  sensation are intact. Psych: Normal affect.  Assessment & Plan    1. Preoperative cardiovascular examination - Plans for upcoming hip replacement. Last ischemic eval by ETT in 2017 with noted poor exercise tolerance and hypertensive response to exercise. Reports occasional fleeting chest pain which she attributes to anxiety. Exercise tolerance of <4 METS due to hip pain. Hx of recurrent pericarditis. History of heart disease in her brother. Never smoker. Per Dr. Tyrell Antonio previous recommendation, proceed with [facility] for cardiac clearance. If low risk, proceed with hip surgery. If not low risk, plan for repeat office visit to discuss next steps.   2. Hx of recurrent pericarditis - Reports no recurrent symptoms. Echo 05/2018 LVEF 50-55%, no RWMA, gr1DD, no pericardial effusion.    3. Palpitations - Reports palpitations are well controlled. Continue potassium and magnesium as prescribed by PCP. EKG without arrhythmia.   Disposition: Lexiscan Myoview. Follow up in 6 month(s) with Dr. Fletcher Anon or APP.    Loel Dubonnet, NP 09/13/2019, 4:10 PM

## 2019-09-19 ENCOUNTER — Ambulatory Visit
Admission: RE | Admit: 2019-09-19 | Discharge: 2019-09-19 | Disposition: A | Discharge status: Home / Self Care | Payer: PPO | Source: Ambulatory Visit | Attending: Family | Admitting: Family

## 2019-09-19 ENCOUNTER — Other Ambulatory Visit: Payer: Self-pay

## 2019-09-19 DIAGNOSIS — I319 Disease of pericardium, unspecified: Secondary | ICD-10-CM

## 2019-09-19 DIAGNOSIS — Z01810 Encounter for preprocedural cardiovascular examination: Secondary | ICD-10-CM | POA: Diagnosis not present

## 2019-09-19 LAB — NM MYOCAR MULTI W/SPECT W/WALL MOTION / EF
Estimated workload: 1 METS
Exercise duration (min): 0 min
Exercise duration (sec): 0 s
LV dias vol: 48 mL (ref 46–106)
LV sys vol: 21 mL
MPHR: 151 {beats}/min
Peak HR: 99 {beats}/min
Percent HR: 65 %
Rest HR: 72 {beats}/min
TID: 0.86

## 2019-09-19 MED ORDER — TECHNETIUM TC 99M TETROFOSMIN IV KIT
30.0000 | PACK | Freq: Once | INTRAVENOUS | Status: AC | PRN
  Administered 2019-09-19: 30.331 via INTRAVENOUS

## 2019-09-19 MED ORDER — REGADENOSON 0.4 MG/5ML IV SOLN
0.4000 mg | Freq: Once | INTRAVENOUS | Status: AC
  Administered 2019-09-19: 0.4 mg via INTRAVENOUS

## 2019-09-19 MED ORDER — TECHNETIUM TC 99M TETROFOSMIN IV KIT
10.0000 | PACK | Freq: Once | INTRAVENOUS | Status: AC | PRN
  Administered 2019-09-19: 10.07 via INTRAVENOUS

## 2019-09-20 ENCOUNTER — Telehealth: Payer: Self-pay

## 2019-09-20 NOTE — Telephone Encounter (Signed)
Called to give the patient stress test results and Caitlin Walker, NP recommendation. [email or web address]. 

## 2019-09-20 NOTE — Telephone Encounter (Signed)
Patient made aware of stress test results with verbalized understanding. Patient sts that if the surgeon will require a formal clearance she will have their office contact ours.

## 2019-09-20 NOTE — Telephone Encounter (Signed)
-----   Message from Loel Dubonnet, NP sent at 09/19/2019 10:02 PM EDT ----- Good result. No evidence of ischemia, low risk study. May proceed with hip surgery. Please inquire where she would like clearance note send and I will send.

## 2019-09-23 DIAGNOSIS — L568 Other specified acute skin changes due to ultraviolet radiation: Secondary | ICD-10-CM | POA: Diagnosis not present

## 2019-09-23 DIAGNOSIS — R21 Rash and other nonspecific skin eruption: Secondary | ICD-10-CM | POA: Diagnosis not present

## 2019-09-26 ENCOUNTER — Other Ambulatory Visit (INDEPENDENT_AMBULATORY_CARE_PROVIDER_SITE_OTHER): Payer: PPO

## 2019-09-26 ENCOUNTER — Other Ambulatory Visit: Payer: Self-pay

## 2019-09-26 DIAGNOSIS — E039 Hypothyroidism, unspecified: Secondary | ICD-10-CM

## 2019-09-26 DIAGNOSIS — E7800 Pure hypercholesterolemia, unspecified: Secondary | ICD-10-CM | POA: Diagnosis not present

## 2019-09-26 DIAGNOSIS — D72819 Decreased white blood cell count, unspecified: Secondary | ICD-10-CM | POA: Diagnosis not present

## 2019-09-26 LAB — CBC WITH DIFFERENTIAL/PLATELET
Basophils Absolute: 0 10*3/uL (ref 0.0–0.1)
Basophils Relative: 0.8 % (ref 0.0–3.0)
Eosinophils Absolute: 0 10*3/uL (ref 0.0–0.7)
Eosinophils Relative: 1.2 % (ref 0.0–5.0)
HCT: 44.2 % (ref 36.0–46.0)
Hemoglobin: 15.2 g/dL — ABNORMAL HIGH (ref 12.0–15.0)
Lymphocytes Relative: 34.5 % (ref 12.0–46.0)
Lymphs Abs: 1 10*3/uL (ref 0.7–4.0)
MCHC: 34.5 g/dL (ref 30.0–36.0)
MCV: 84.1 fl (ref 78.0–100.0)
Monocytes Absolute: 0.2 10*3/uL (ref 0.1–1.0)
Monocytes Relative: 8.4 % (ref 3.0–12.0)
Neutro Abs: 1.6 10*3/uL (ref 1.4–7.7)
Neutrophils Relative %: 55.1 % (ref 43.0–77.0)
Platelets: 185 10*3/uL (ref 150.0–400.0)
RBC: 5.25 Mil/uL — ABNORMAL HIGH (ref 3.87–5.11)
RDW: 14.5 % (ref 11.5–15.5)
WBC: 2.8 10*3/uL — ABNORMAL LOW (ref 4.0–10.5)

## 2019-09-26 LAB — BASIC METABOLIC PANEL
BUN: 19 mg/dL (ref 6–23)
CO2: 30 mEq/L (ref 19–32)
Calcium: 9.1 mg/dL (ref 8.4–10.5)
Chloride: 105 mEq/L (ref 96–112)
Creatinine, Ser: 1.09 mg/dL (ref 0.40–1.20)
GFR: 49.64 mL/min — ABNORMAL LOW (ref >60.00)
Glucose, Bld: 92 mg/dL (ref 70–99)
Potassium: 4 mEq/L (ref 3.5–5.1)
Sodium: 142 mEq/L (ref 135–145)

## 2019-09-26 LAB — LIPID PANEL
Cholesterol: 208 mg/dL — ABNORMAL HIGH (ref 0–200)
HDL: 57.5 mg/dL (ref >39.00)
LDL Cholesterol: 120 mg/dL — ABNORMAL HIGH (ref 0–99)
NonHDL: 150.84
Total CHOL/HDL Ratio: 4
Triglycerides: 152 mg/dL — ABNORMAL HIGH (ref 0.0–149.0)
VLDL: 30.4 mg/dL (ref 0.0–40.0)

## 2019-09-26 LAB — TSH: TSH: 5.58 u[IU]/mL — ABNORMAL HIGH (ref 0.35–4.50)

## 2019-09-26 LAB — HEPATIC FUNCTION PANEL
ALT: 34 U/L (ref 0–35)
AST: 14 U/L (ref 0–37)
Albumin: 4.1 g/dL (ref 3.5–5.2)
Alkaline Phosphatase: 134 U/L — ABNORMAL HIGH (ref 39–117)
Bilirubin, Direct: 0.1 mg/dL (ref 0.0–0.3)
Total Bilirubin: 0.6 mg/dL (ref 0.2–1.2)
Total Protein: 6.5 g/dL (ref 6.0–8.3)

## 2019-09-28 ENCOUNTER — Ambulatory Visit (INDEPENDENT_AMBULATORY_CARE_PROVIDER_SITE_OTHER): Payer: PPO | Admitting: Internal Medicine

## 2019-09-28 ENCOUNTER — Other Ambulatory Visit: Payer: Self-pay

## 2019-09-28 DIAGNOSIS — F419 Anxiety disorder, unspecified: Secondary | ICD-10-CM | POA: Diagnosis not present

## 2019-09-28 DIAGNOSIS — R5383 Other fatigue: Secondary | ICD-10-CM

## 2019-09-28 DIAGNOSIS — R945 Abnormal results of liver function studies: Secondary | ICD-10-CM

## 2019-09-28 DIAGNOSIS — E039 Hypothyroidism, unspecified: Secondary | ICD-10-CM | POA: Diagnosis not present

## 2019-09-28 DIAGNOSIS — Z85038 Personal history of other malignant neoplasm of large intestine: Secondary | ICD-10-CM | POA: Diagnosis not present

## 2019-09-28 DIAGNOSIS — E7800 Pure hypercholesterolemia, unspecified: Secondary | ICD-10-CM | POA: Diagnosis not present

## 2019-09-28 DIAGNOSIS — F320 Major depressive disorder, single episode, mild: Secondary | ICD-10-CM | POA: Diagnosis not present

## 2019-09-28 DIAGNOSIS — Z85850 Personal history of malignant neoplasm of thyroid: Secondary | ICD-10-CM

## 2019-09-28 DIAGNOSIS — Z853 Personal history of malignant neoplasm of breast: Secondary | ICD-10-CM | POA: Diagnosis not present

## 2019-09-28 DIAGNOSIS — M25551 Pain in right hip: Secondary | ICD-10-CM | POA: Diagnosis not present

## 2019-09-28 DIAGNOSIS — F32A Depression, unspecified: Secondary | ICD-10-CM

## 2019-09-28 DIAGNOSIS — K219 Gastro-esophageal reflux disease without esophagitis: Secondary | ICD-10-CM | POA: Diagnosis not present

## 2019-09-28 DIAGNOSIS — D72819 Decreased white blood cell count, unspecified: Secondary | ICD-10-CM

## 2019-09-28 DIAGNOSIS — R7989 Other specified abnormal findings of blood chemistry: Secondary | ICD-10-CM

## 2019-09-28 MED ORDER — LEVOTHYROXINE SODIUM 112 MCG PO TABS: 112.0000 ug | ORAL_TABLET | Freq: Every day | ORAL | 3 refills | Status: DC

## 2019-09-28 NOTE — Progress Notes (Signed)
Patient ID: DAYANIS YUILLE, female   DOB: November 23, 1950, 69 y.o.   MRN: [id number]   Subjective:    Patient ID: PEREGRINE DENIS, female    DOB: Feb 12, 1951, 69 y.o.   MRN: [id number]  HPI This visit occurred during the SARS-CoV-2 public health emergency.  Safety protocols were in place, including screening questions prior to the visit, additional usage of staff PPE, and extensive cleaning of exam room while observing appropriate contact time as indicated for disinfecting solutions.  Patient here for a scheduled follow up.  Was just evaluated at acute care with concerns regarding rash - upper chest and arms.  S/p depo-medrol injection and was given triamcinolone cream.  Rash is better.  No chest pain or sob reported.  Had stress test two weeks ago - per pt.  Does report some fatigue.  No increased cough or congestion.  No abdominal pain.  Some chronic diarrhea.  Right hip/buttock pain.  Has f/u with Dr Roland Rack 10/06/19.    Past Medical History:  Diagnosis Date  . Anxiety   . Arthritis    Osteoarthritis  . Breast cancer ([place]) 2009   right breast lumpectomy with rad tx  . Colon cancer ([place])    surgery with chemo and rad tx  . GERD (gastroesophageal reflux disease)   . Hypothyroidism   . Liver disease   . Liver nodule    s/p negative biopsy  . Malignant neoplasm of thyroid gland ([place]) 2002   s/p surgery and XRT  . Nephrolithiasis    s/p lithotripsy  . Osteoporosis   . Other and unspecified hyperlipidemia   . Palpitations   . Personal history of chemotherapy   . Personal history of malignant neoplasm of large intestine    carcinoma - cecum, s/p right laparoscopic colectomy - s/p chemotherapy and XRT  . Personal history of radiation therapy   . Pure hypercholesterolemia   . Unspecified hereditary and idiopathic peripheral neuropathy    Past Surgical History:  Procedure Laterality Date  . APPENDECTOMY  1985  . BREAST BIOPSY Right 2009   positive  . BREAST BIOPSY Right 2009   negative  .  BREAST LUMPECTOMY Right 2009   breast cancer  . CHOLECYSTECTOMY  1995  . COLONOSCOPY    . COLONOSCOPY WITH PROPOFOL N/A 10/29/2017   Procedure: COLONOSCOPY WITH PROPOFOL;  Surgeon: Manya Silvas, MD;  Location: [facility] ENDOSCOPY;  Service: Endoscopy;  Laterality: N/A;  . DILATION AND CURETTAGE OF UTERUS  1990  . DILATION AND CURETTAGE, DIAGNOSTIC / THERAPEUTIC  1990  . ESOPHAGOGASTRODUODENOSCOPY    . ESOPHAGOGASTRODUODENOSCOPY (EGD) WITH PROPOFOL N/A 10/29/2017   Procedure: ESOPHAGOGASTRODUODENOSCOPY (EGD) WITH PROPOFOL;  Surgeon: Manya Silvas, MD;  Location: [facility] ENDOSCOPY;  Service: Endoscopy;  Laterality: N/A;  . LAPAROSCOPIC PARTIAL COLECTOMY     stage 3-C carcinoma of the cecum, s/p chemotherapy and xrt  . LITHOTRIPSY    . SIGMOIDOSCOPY  08/26/1993  . THYROID LOBECTOMY  2002   s/p XRT   Family History  Problem Relation Age of Onset  . Stroke Mother        40s  . Alzheimer's disease Mother   . Cancer Mother   . Lung cancer Father   . Prostate cancer Father   . Cancer Father        Colon  . Colon cancer Father   . Breast cancer Sister        31's  . Lung cancer Sister   . Breast cancer Maternal Aunt  Social History   Socioeconomic History  . Marital status: Divorced    Spouse name: Not on file  . Number of children: 0  . Years of education: Not on file  . Highest education level: Not on file  Occupational History  . Not on file  Tobacco Use  . Smoking status: Never Smoker  . Smokeless tobacco: Never Used  Substance and Sexual Activity  . Alcohol use: No    Alcohol/week: 0.0 standard drinks  . Drug use: No  . Sexual activity: Not Currently  Other Topics Concern  . Not on file  Social History Narrative  . Not on file   Social Determinants of Health   Financial Resource Strain: Low Risk   . Difficulty of Paying Living Expenses: Not hard at all  Food Insecurity: No Food Insecurity  . Worried About Charity fundraiser in the Last Year: Never true  .  Ran Out of Food in the Last Year: Never true  Transportation Needs: No Transportation Needs  . Lack of Transportation (Medical): No  . Lack of Transportation (Non-Medical): No  Physical Activity:   . Days of Exercise per Week:   . Minutes of Exercise per Session:   Stress: No Stress Concern Present  . Feeling of Stress : Not at all  Social Connections: Unknown  . Frequency of Communication with Friends and Family: More than three times a week  . Frequency of Social Gatherings with Friends and Family: Not on file  . Attends Religious Services: Not on file  . Active Member of Clubs or Organizations: Not on file  . Attends Archivist Meetings: Not on file  . Marital Status: Not on file    Outpatient Encounter Medications as of 09/28/2019  Medication Sig  . clonazePAM (KLONOPIN) 0.5 MG tablet Take 1.5 mg by mouth 2 (two) times daily.   . colestipol (COLESTID) 1 g tablet Take by mouth daily.   Marland Kitchen escitalopram (LEXAPRO) 20 MG tablet Take 20 mg by mouth daily.    Marland Kitchen ibuprofen (ADVIL,MOTRIN) 600 MG tablet Take 1 tablet (600 mg total) by mouth every 6 (six) hours as needed.  Marland Kitchen levothyroxine (SYNTHROID) 112 MCG tablet Take 1 tablet (112 mcg total) by mouth daily.  . magnesium oxide (MAG-OX) 400 MG tablet TAKE 1 TABLET BY MOUTH EVERY DAY  . nabumetone (RELAFEN) 500 MG tablet Take 500 mg by mouth 2 (two) times daily as needed.   . pantoprazole (PROTONIX) 40 MG tablet TAKE 1 TABLET BY MOUTH EVERY DAY  . potassium chloride (KLOR-CON) 10 MEQ tablet TAKE 1 TABLET BY MOUTH EVERY DAY  . triamcinolone cream (KENALOG) 0.1 % SMARTSIG:1 Application Topical 2-3 Times Daily  . [DISCONTINUED] levothyroxine (SYNTHROID) 100 MCG tablet TAKE 1 TABLET BY MOUTH EVERY DAY   Facility-Administered Encounter Medications as of 09/28/2019  Medication  . ondansetron (ZOFRAN) tablet 8 mg    Review of Systems  Constitutional: Negative for appetite change and unexpected weight change.  HENT: Negative for  congestion and sinus pressure.   Respiratory: Negative for cough, chest tightness and shortness of breath.   Cardiovascular: Negative for chest pain, palpitations and leg swelling.  Gastrointestinal: Positive for diarrhea. Negative for abdominal pain, nausea and vomiting.  Genitourinary: Negative for difficulty urinating and dysuria.  Musculoskeletal: Negative for myalgias.       Right hip pain.    Skin: Negative for color change and rash.  Neurological: Negative for dizziness, light-headedness and headaches.  Psychiatric/Behavioral: Negative for agitation and dysphoric mood.  Objective:    Physical Exam Vitals reviewed.  Constitutional:      General: She is not in acute distress.    Appearance: Normal appearance.  HENT:     Head: Normocephalic and atraumatic.     Right Ear: External ear normal.     Left Ear: External ear normal.  Eyes:     General: No scleral icterus.       Right eye: No discharge.        Left eye: No discharge.     Conjunctiva/sclera: Conjunctivae normal.  Neck:     Thyroid: No thyromegaly.  Cardiovascular:     Rate and Rhythm: Normal rate and regular rhythm.  Pulmonary:     Effort: No respiratory distress.     Breath sounds: Normal breath sounds. No wheezing.  Abdominal:     General: Bowel sounds are normal.     Palpations: Abdomen is soft.     Tenderness: There is no abdominal tenderness.  Musculoskeletal:        General: No swelling or tenderness.     Cervical back: Neck supple. No tenderness.  Lymphadenopathy:     Cervical: No cervical adenopathy.  Skin:    Findings: No erythema or rash.  Neurological:     Mental Status: She is alert.  Psychiatric:        Mood and Affect: Mood normal.        Behavior: Behavior normal.     BP 130/78   Pulse 83   Temp (!) 97.3 F (36.3 C)   Resp 16   Ht 5\' 4"  (1.626 m)   Wt 151 lb 9.6 oz (68.8 kg)   SpO2 98%   BMI 26.02 kg/m  Wt Readings from Last 3 Encounters:  09/28/19 151 lb 9.6 oz (68.8  kg)  09/13/19 151 lb (68.5 kg)  06/07/19 147 lb (66.7 kg)     Lab Results  Component Value Date   WBC 2.8 (L) 09/26/2019   HGB 15.2 (H) 09/26/2019   HCT 44.2 09/26/2019   PLT 185.0 09/26/2019   GLUCOSE 92 09/26/2019   CHOL 208 (H) 09/26/2019   TRIG 152.0 (H) 09/26/2019   HDL 57.50 09/26/2019   LDLDIRECT 126.0 07/11/2015   LDLCALC 120 (H) 09/26/2019   ALT 34 09/26/2019   AST 14 09/26/2019   NA 142 09/26/2019   K 4.0 09/26/2019   CL 105 09/26/2019   CREATININE 1.09 09/26/2019   BUN 19 09/26/2019   CO2 30 09/26/2019   TSH 5.58 (H) 09/26/2019   HGBA1C 5.3 12/10/2016    NM Myocar Multi W/Spect W/Wall Motion / EF  Result Date: 09/19/2019  Normal pharmacologic myocardial perfusion stress test without significant ischemia or scar.  The left ventricular ejection fraction is normal (55-65%).  Scant coronary artery calcification and aortic atherosclerosis are noted on the attenuation correction CT.  This is a low risk study.        Assessment & Plan:   Problem List Items Addressed This Visit    Abnormal liver function test    Has been evaluated by GI. Fatty liver.  Had ultrasound and fibroscan - diffuse hepatic steatosis and moderate risk for fibrosis.  Diet, exercise and weight loss.  Follow liver function tests.        Relevant Orders   Hepatic function panel   Anxiety    Being followed by psychiatry.  On lexapro and clonazepam.        GERD (gastroesophageal reflux disease)    Controlled on  protonix.        History of breast cancer    Mammogram ordered.  Overdue.  Need to schedule.        History of malignant neoplasm of large intestine    Scheduled for f/u colonoscopy.  Chronic diarrhea.        History of thyroid cancer    On thyroid replacement. Follow tsh.        Hypercholesterolemia    Low cholesterol diet and exercise.  Follow lipid panel.        Hypothyroidism    On thyroid replacement.  Follow tsh.        Relevant Medications   levothyroxine  (SYNTHROID) 112 MCG tablet   Other Relevant Orders   TSH   Leukopenia    Follow cbc.        Relevant Orders   CBC with Differential/Platelet   Mild depression (HCC)    Seeing Dr Clovis Riley.  Stable.        Other fatigue    Check routine labs.  Had stress test two weeks ago.        Right hip pain    Right hip pain as outlined.  Due to f/u with ortho 10/06/19.            [name], MD

## 2019-09-28 NOTE — Patient Instructions (Signed)
Stop synthroid 162mcg per day and start synthroid 181mcg per day

## 2019-10-06 DIAGNOSIS — M1611 Unilateral primary osteoarthritis, right hip: Secondary | ICD-10-CM | POA: Diagnosis not present

## 2019-10-07 ENCOUNTER — Encounter: Payer: Self-pay | Admitting: Internal Medicine

## 2019-10-07 NOTE — Assessment & Plan Note (Signed)
Scheduled for f/u colonoscopy.  Chronic diarrhea.

## 2019-10-07 NOTE — Assessment & Plan Note (Signed)
Being followed by psychiatry.  On lexapro and clonazepam.

## 2019-10-07 NOTE — Assessment & Plan Note (Signed)
Controlled on protonix.   

## 2019-10-07 NOTE — Assessment & Plan Note (Signed)
On thyroid replacement.  Follow tsh.  

## 2019-10-07 NOTE — Assessment & Plan Note (Signed)
Mammogram ordered.  Overdue.  Need to schedule.

## 2019-10-07 NOTE — Assessment & Plan Note (Signed)
Low cholesterol diet and exercise.  Follow lipid panel.   

## 2019-10-07 NOTE — Assessment & Plan Note (Signed)
Has been evaluated by GI. Fatty liver.  Had ultrasound and fibroscan - diffuse hepatic steatosis and moderate risk for fibrosis.  Diet, exercise and weight loss.  Follow liver function tests.

## 2019-10-07 NOTE — Assessment & Plan Note (Signed)
Check routine labs.  Had stress test two weeks ago.

## 2019-10-07 NOTE — Assessment & Plan Note (Signed)
Right hip pain as outlined.  Due to f/u with ortho 10/06/19.

## 2019-10-07 NOTE — Assessment & Plan Note (Signed)
Seeing Dr Clovis Riley.  Stable.

## 2019-10-07 NOTE — Assessment & Plan Note (Signed)
Follow cbc.  

## 2019-10-10 ENCOUNTER — Other Ambulatory Visit: Payer: Self-pay | Admitting: Surgery

## 2019-10-17 ENCOUNTER — Other Ambulatory Visit: Payer: Self-pay

## 2019-10-17 ENCOUNTER — Encounter
Admission: RE | Admit: 2019-10-17 | Discharge: 2019-10-17 | Disposition: A | Discharge status: Home / Self Care | Payer: PPO | Source: Ambulatory Visit | Attending: Surgery | Admitting: Surgery

## 2019-10-17 DIAGNOSIS — Z01818 Encounter for other preprocedural examination: Secondary | ICD-10-CM | POA: Insufficient documentation

## 2019-10-17 HISTORY — DX: Other complications of anesthesia, initial encounter: T88.59XA

## 2019-10-17 HISTORY — DX: Nausea with vomiting, unspecified: R11.2

## 2019-10-17 HISTORY — DX: Personal history of other diseases of the digestive system: Z87.19

## 2019-10-17 HISTORY — DX: Palpitations: R00.2

## 2019-10-17 HISTORY — DX: Other specified postprocedural states: Z98.890

## 2019-10-17 HISTORY — DX: Personal history of urinary calculi: Z87.442

## 2019-10-17 NOTE — Pre-Procedure Instructions (Signed)
Loel Dubonnet, NP  Nurse Practitioner  Cardiology  Progress Notes     Signed  Encounter Date:  09/13/2019          Signed      Expand All Collapse All  Show:Clear all [x] Manual[x] Template[x] Copied  Added by: [x] Loel Dubonnet, NP  [] Hover for details   Office Visit    Patient Name: Debra Hale Date of Encounter: 09/13/2019  Primary Care Provider:  [name], MD Primary Cardiologist:  Kathlyn Sacramento, MD Electrophysiologist:  None   Chief Complaint    Debra Hale is a 69 y.o. female with a hx of recurrent pericarditis, HLD, thyroid carcinoma s/p surgery and XRT, colon cancer s/p laparoscopic right colectomy, nephrolithiasis, anxiety, OP, breast cancer, palpitations in the setting of hypokalemia/hypomagnesia presents today for cardiac clearance for procedure.    Past Medical History        Past Medical History:  Diagnosis Date  . Anxiety   . Arthritis    Osteoarthritis  . Breast cancer ([place]) 2009   right breast lumpectomy with rad tx  . Colon cancer ([place])    surgery with chemo and rad tx  . GERD (gastroesophageal reflux disease)   . Hypothyroidism   . Liver disease   . Liver nodule    s/p negative biopsy  . Malignant neoplasm of thyroid gland ([place]) 2002   s/p surgery and XRT  . Nephrolithiasis    s/p lithotripsy  . Osteoporosis   . Other and unspecified hyperlipidemia   . Palpitations   . Personal history of chemotherapy   . Personal history of malignant neoplasm of large intestine    carcinoma - cecum, s/p right laparoscopic colectomy - s/p chemotherapy and XRT  . Personal history of radiation therapy   . Pure hypercholesterolemia   . Unspecified hereditary and idiopathic peripheral neuropathy         Past Surgical History:  Procedure Laterality Date  . APPENDECTOMY  1985  . BREAST BIOPSY Right 2009   positive  . BREAST BIOPSY Right 2009   negative  . BREAST LUMPECTOMY Right 2009   breast  cancer  . CHOLECYSTECTOMY  1995  . COLONOSCOPY    . COLONOSCOPY WITH PROPOFOL N/A 10/29/2017   Procedure: COLONOSCOPY WITH PROPOFOL;  Surgeon: Manya Silvas, MD;  Location: [facility] ENDOSCOPY;  Service: Endoscopy;  Laterality: N/A;  . DILATION AND CURETTAGE OF UTERUS  1990  . DILATION AND CURETTAGE, DIAGNOSTIC / THERAPEUTIC  1990  . ESOPHAGOGASTRODUODENOSCOPY    . ESOPHAGOGASTRODUODENOSCOPY (EGD) WITH PROPOFOL N/A 10/29/2017   Procedure: ESOPHAGOGASTRODUODENOSCOPY (EGD) WITH PROPOFOL;  Surgeon: Manya Silvas, MD;  Location: [facility] ENDOSCOPY;  Service: Endoscopy;  Laterality: N/A;  . LAPAROSCOPIC PARTIAL COLECTOMY     stage 3-C carcinoma of the cecum, s/p chemotherapy and xrt  . LITHOTRIPSY    . SIGMOIDOSCOPY  08/26/1993  . THYROID LOBECTOMY  2002   s/p XRT   Allergies       Allergies  Allergen Reactions  . Demeclocycline Other (See Comments)    Throat swells  . Tetracyclines & Related Other (See Comments)    Throat swells   . Augmentin [Amoxicillin-Pot Clavulanate] Diarrhea  . Bentyl [Dicyclomine Hcl]   . Ciprofloxacin Diarrhea  . Codeine Other (See Comments)    dizziness    . Epinephrine   . Flagyl [Metronidazole]   . Librax [Chlordiazepoxide-Clidinium]   . Phenobarbital   . Prednisone     Nervous    . Ultram [Tramadol] Other (See Comments)  Sick feeling    History of Present Illness    Debra Hale is a 69 y.o. female with a hx of recurrent pericarditis, HLD, thyroid carcinoma s/p surgery and XRT, colon cancer s/p laparoscopic right colectomy, nephrolithiasis, anxiety, OP, breast cancer, palpitations in the setting of hypokalemia/hypomagnesia. She was last seen 05/22/19 by Dr. Fletcher Anon.  Previous treadmill stress test 2017 with no evidence of ischemia with poor exercise tolerance and hypertensive respones to exercise. She had recurrent pericarditis and previous small pericardial effusion since 2017. Had suspected  pericarditis in setting of URI in 2020. Sedimentation rate up to 70. Echo 05/2019 with LVEF 50-55% without pericardial effusion. Treated with colchicine and NSAIDS.   Reports no shortness of breath nor dyspnea on exertion. No edema, orthopnea, PND.   Reports occasional palpitations that have been ongoing for many years and are stable at her baseline. Avoids "stimulants" such as caffeine and prednisone per her report as she notices they are better controlled.   Tells me her pain in her hip significantly limits her mobility. Exercise tolerance of <4 Mets. She reports an occasional fleeting chest pain that she attributes to anxiety.   EKGs/Labs/Other Studies Reviewed:   The following studies were reviewed today: ETT May 21, 2016  Blood pressure demonstrated a hypertensive response to exercise.  There was no ST segment deviation noted during stress.  No T wave inversion was noted during stress.  Normal treadmill stress test with no evidence of ischemia. Poor exercise capacity with hypertensive response to exercise  Echo 05/2018 - Left ventricle: The cavity size was normal. There was mild  concentric hypertrophy. Systolic function was normal. The  estimated ejection fraction was in the range of 50% to 55%. Wall  motion was normal; there were no regional wall motion  abnormalities. Doppler parameters are consistent with abnormal  left ventricular relaxation (grade 1 diastolic dysfunction).  - Pericardium, extracardiac: There was no pericardial effusion.    EKG:  EKG is ordered today.  The ekg ordered today demonstrates NSR 71 bpm with low voltage QRS and nonspecific ST/T wave changes. Overall flat t-waves. No acute ST/T wave changes. Stable compared to previous.   Recent Labs: 11/25/2018: ALT 33; BUN 19; Creatinine, Ser 0.99; Hemoglobin 14.9; Platelets 182.0; Potassium 3.9; Sodium 143; TSH 1.55  Recent Lipid Panel Labs (Brief)     Component Value Date/Time   CHOL 171  11/25/2018 1025   TRIG 103.0 11/25/2018 1025   HDL 51.80 11/25/2018 1025   CHOLHDL 3 11/25/2018 1025   VLDL 20.6 11/25/2018 1025   LDLCALC 99 11/25/2018 1025   LDLDIRECT 126.0 07/11/2015 0956      Home Medications   Active Medications      Current Meds  Medication Sig  . clonazePAM (KLONOPIN) 0.5 MG tablet Take 1.5 mg by mouth 2 (two) times daily.   . colestipol (COLESTID) 1 g tablet Take by mouth daily.   Marland Kitchen escitalopram (LEXAPRO) 20 MG tablet Take 20 mg by mouth daily.    Marland Kitchen ibuprofen (ADVIL,MOTRIN) 600 MG tablet Take 1 tablet (600 mg total) by mouth every 6 (six) hours as needed.  Marland Kitchen levothyroxine (SYNTHROID) 100 MCG tablet TAKE 1 TABLET BY MOUTH EVERY DAY  . magnesium oxide (MAG-OX) 400 MG tablet TAKE 1 TABLET BY MOUTH EVERY DAY  . pantoprazole (PROTONIX) 40 MG tablet TAKE 1 TABLET BY MOUTH EVERY DAY  . potassium chloride (KLOR-CON) 10 MEQ tablet TAKE 1 TABLET BY MOUTH EVERY DAY      Current Facility-Administered Medications for the 09/13/19  encounter (Office Visit) with Loel Dubonnet, NP  Medication  . ondansetron (ZOFRAN) tablet 8 mg      Review of Systems      Review of Systems  Constitution: Negative for chills, fever and malaise/fatigue.  Cardiovascular: Positive for chest pain ("occasional") and palpitations. Negative for dyspnea on exertion, leg swelling, near-syncope, orthopnea and syncope.  Respiratory: Negative for cough, shortness of breath and wheezing.   Musculoskeletal: Positive for joint pain (R hip).  Gastrointestinal: Negative for nausea and vomiting.  Neurological: Negative for dizziness, light-headedness and weakness.   All other systems reviewed and are otherwise negative except as noted above.  Physical Exam    VS:  BP 130/88 (BP Location: Left Arm, Patient Position: Sitting, Cuff Size: Normal)   Pulse 71   Ht 5\' 4"  (1.626 m)   Wt 151 lb (68.5 kg)   SpO2 99%   BMI 25.92 kg/m  , BMI Body mass index is 25.92 kg/m. GEN: Well  nourished, well developed, in no acute distress. HEENT: normal. Neck: Supple, no JVD, carotid bruits, or masses. Cardiac: RRR, no murmurs, rubs, or gallops. No clubbing, cyanosis, edema.  Radials/DP/PT 2+ and equal bilaterally.  Respiratory:  Respirations regular and unlabored, clear to auscultation bilaterally. GI: Soft, nontender, nondistended, BS + x 4. MS: No deformity or atrophy. Skin: Warm and dry, no rash. Neuro:  Strength and sensation are intact. Psych: Normal affect.  Assessment & Plan    1. Preoperative cardiovascular examination - Plans for upcoming hip replacement. Last ischemic eval by ETT in 2017 with noted poor exercise tolerance and hypertensive response to exercise. Reports occasional fleeting chest pain which she attributes to anxiety. Exercise tolerance of <4 METS due to hip pain. Hx of recurrent pericarditis. History of heart disease in her brother. Never smoker. Per Dr. Tyrell Antonio previous recommendation, proceed with [facility] for cardiac clearance. If low risk, proceed with hip surgery. If not low risk, plan for repeat office visit to discuss next steps.   2. Hx of recurrent pericarditis - Reports no recurrent symptoms. Echo 05/2018 LVEF 50-55%, no RWMA, gr1DD, no pericardial effusion.    3. Palpitations - Reports palpitations are well controlled. Continue potassium and magnesium as prescribed by PCP. EKG without arrhythmia.   Disposition: Lexiscan Myoview. Follow up in 6 month(s) with Dr. Fletcher Anon or APP.    Loel Dubonnet, NP 09/13/2019, 4:10 PM        Electronically signed by Loel Dubonnet, NP at 09/13/2019 5:02 PM  Office Visit on 09/13/2019   Office Visit on 09/13/2019     Detailed Report    Note shared with patient

## 2019-10-17 NOTE — Pre-Procedure Instructions (Signed)
Debra Hampshire, MD  Physician  Cardiology  Progress Notes     Signed  Encounter Date:  06/01/2019          Signed      Expand All Collapse All  Show:Clear all [x] Manual[x] Template[x] Copied  Added by: [x] Debra Hampshire, MD  [] Hover for details     Cardiology Office Note   Date:  06/01/2019   ID:  Debra Hale, DOB [birth year], MRN [id number]  PCP:  Debra Pheasant, MD      Cardiologist:   Debra Sacramento, MD       Chief Complaint  Patient presents with  . office visit    6 month F/U; Meds verbally reviewed with patient.      History of Present Illness: Debra Hale is a 70 y.o. female who presents for a follow up visit recurrent pericarditis.  She has known history of hyperlipidemia, thyroid carcinoma s/p surgery and XRT, colon cancer s/p laparoscopic right colectomy, nephrolithiasis, anxiety, osteoporosis and breast cancer. She had palpitations in the setting of hypokalemia and hypomagnesemia.  She had previous treadmill stress test in 2017 that showed no evidence of ischemia with poor exercise tolerance and hypertensive response to exercise.  She had recurrent pericarditis and previous small pericardial effusion since 2017.  She had suspected pericarditis last year in the setting of  upper respiratory tract infection.  Sedimentation rate was elevated at 70.  She had follow-up echocardiogram done in January which showed an EF of 50 to 55% with no pericardial effusion.  She was treated with colchicine and NSAIDs.  She reports resolution of chest pain.  She has been doing reasonably well with no recent chest pain or shortness of breath.  She continues to struggle with anxiety.  She also reports right hip discomfort and she was told she might require hip replacement.      Past Medical History:  Diagnosis Date  . Anxiety   . Arthritis    Osteoarthritis  . Breast cancer ([place]) 2009   right breast lumpectomy with rad tx  . Colon cancer ([place])      surgery with chemo and rad tx  . GERD (gastroesophageal reflux disease)   . Hypothyroidism   . Liver disease   . Liver nodule    s/p negative biopsy  . Malignant neoplasm of thyroid gland ([place]) 2002   s/p surgery and XRT  . Nephrolithiasis    s/p lithotripsy  . Osteoporosis   . Other and unspecified hyperlipidemia   . Palpitations   . Personal history of chemotherapy   . Personal history of malignant neoplasm of large intestine    carcinoma - cecum, s/p right laparoscopic colectomy - s/p chemotherapy and XRT  . Personal history of radiation therapy   . Pure hypercholesterolemia   . Unspecified hereditary and idiopathic peripheral neuropathy          Past Surgical History:  Procedure Laterality Date  . APPENDECTOMY  1985  . BREAST BIOPSY Right 2009   positive  . BREAST BIOPSY Right 2009   negative  . BREAST LUMPECTOMY Right 2009   breast cancer  . CHOLECYSTECTOMY  1995  . COLONOSCOPY    . COLONOSCOPY WITH PROPOFOL N/A 10/29/2017   Procedure: COLONOSCOPY WITH PROPOFOL;  Surgeon: Manya Silvas, MD;  Location: [facility] ENDOSCOPY;  Service: Endoscopy;  Laterality: N/A;  . DILATION AND CURETTAGE OF UTERUS  1990  . DILATION AND CURETTAGE, DIAGNOSTIC / THERAPEUTIC  1990  . ESOPHAGOGASTRODUODENOSCOPY    . ESOPHAGOGASTRODUODENOSCOPY (  EGD) WITH PROPOFOL N/A 10/29/2017   Procedure: ESOPHAGOGASTRODUODENOSCOPY (EGD) WITH PROPOFOL;  Surgeon: Manya Silvas, MD;  Location: [facility] ENDOSCOPY;  Service: Endoscopy;  Laterality: N/A;  . LAPAROSCOPIC PARTIAL COLECTOMY     stage 3-C carcinoma of the cecum, s/p chemotherapy and xrt  . LITHOTRIPSY    . SIGMOIDOSCOPY  08/26/1993  . THYROID LOBECTOMY  2002   s/p XRT           Current Outpatient Medications  Medication Sig Dispense Refill  . clonazePAM (KLONOPIN) 0.5 MG tablet Take 1.5 mg by mouth 2 (two) times daily.   0  . colestipol (COLESTID) 1 g tablet Take by mouth daily.     Marland Kitchen  escitalopram (LEXAPRO) 20 MG tablet Take 20 mg by mouth daily.      Marland Kitchen ibuprofen (ADVIL,MOTRIN) 600 MG tablet Take 1 tablet (600 mg total) by mouth every 6 (six) hours as needed. 30 tablet 0  . levothyroxine (SYNTHROID) 100 MCG tablet TAKE 1 TABLET BY MOUTH EVERY DAY 90 tablet 3  . magnesium oxide (MAG-OX) 400 MG tablet TAKE 1 TABLET BY MOUTH EVERY DAY 90 tablet 1  . pantoprazole (PROTONIX) 40 MG tablet TAKE 1 TABLET BY MOUTH EVERY DAY 90 tablet 2  . potassium chloride (KLOR-CON) 10 MEQ tablet TAKE 1 TABLET BY MOUTH EVERY DAY 90 tablet 1   Current Facility-Administered Medications  Medication Dose Route Frequency Provider Last Rate Last Admin  . ondansetron (ZOFRAN) tablet 8 mg  8 mg Oral Once Guse, Jacquelynn Cree, FNP        Allergies:   Demeclocycline, Tetracyclines & related, Augmentin [amoxicillin-pot clavulanate], Bentyl [dicyclomine hcl], Ciprofloxacin, Codeine, Epinephrine, Flagyl [metronidazole], Librax [chlordiazepoxide-clidinium], Phenobarbital, Prednisone, and Ultram [tramadol]    Social History:  The patient  reports that she has never smoked. She has never used smokeless tobacco. She reports that she does not drink alcohol or use drugs.   Family History:  The patient's family history includes Alzheimer's disease in her mother; Breast cancer in her maternal aunt and sister; Cancer in her father and mother; Colon cancer in her father; Lung cancer in her father and sister; Prostate cancer in her father; Stroke in her mother.    ROS:  Please see the history of present illness.   Otherwise, review of systems are positive for none.   All other systems are reviewed and negative.    PHYSICAL EXAM: VS:  BP 110/80 (BP Location: Left Arm, Patient Position: Sitting, Cuff Size: Normal)   Pulse 96   Ht 5' 4.5" (1.638 m)   Wt 147 lb (66.7 kg)   SpO2 97%   BMI 24.84 kg/m  , BMI Body mass index is 24.84 kg/m. GEN: Well nourished, well developed, in no acute distress  HEENT: normal    Neck: no JVD, carotid bruits, or masses Cardiac: RRR; no murmurs, rubs, or gallops,no edema  Respiratory:  clear to auscultation bilaterally, normal work of breathing GI: soft, nontender, nondistended, + BS MS: no deformity or atrophy  Skin: warm and dry, no rash Neuro:  Strength and sensation are intact Psych: euthymic mood, full affect   EKG:  EKG is  ordered today. EKG showed normal sinus rhythm with low voltage and nonspecific T wave changes.   Recent Labs: 11/25/2018: ALT 33; BUN 19; Creatinine, Ser 0.99; Hemoglobin 14.9; Platelets 182.0; Potassium 3.9; Sodium 143; TSH 1.55    Lipid Panel Labs (Brief)          Component Value Date/Time   CHOL 171 11/25/2018  1025   TRIG 103.0 11/25/2018 1025   HDL 51.80 11/25/2018 1025   CHOLHDL 3 11/25/2018 1025   VLDL 20.6 11/25/2018 1025   LDLCALC 99 11/25/2018 1025   LDLDIRECT 126.0 07/11/2015 0956           Wt Readings from Last 3 Encounters:  06/01/19 147 lb (66.7 kg)  09/27/18 137 lb 8 oz (62.4 kg)  06/21/18 134 lb (60.8 kg)       ASSESSMENT AND PLAN:  1.  History of recurrent pericarditis: No recent symptoms.  She seems to be stable overall. She seems to be having significant anxiety symptoms.  2. Palpitations: In the setting of hypokalemia and hypomagnesemia.  No current symptoms.  3.  If the patient requires right hip replacement, I do recommend ischemic cardiac evaluation with a Lexiscan Myoview given poor functional capacity related to her hip.   Disposition:   FU in 6 months  Signed,  Debra Sacramento, MD  06/01/2019 1:22 PM    [facility]        Electronically signed by Debra Hampshire, MD at 06/01/2019 4:39 PM  Office Visit on 06/01/2019   Office Visit on 06/01/2019     Detailed Report    Note shared with patient

## 2019-10-17 NOTE — Patient Instructions (Addendum)
Your procedure is scheduled on: 10-24-19 TUESDAY Report to Same Day Surgery 2nd floor medical mall [facility] Entrance-take elevator on left to 2nd floor.  Check in with surgery information desk.) To find out your arrival time please call [phone] between 1PM - 3PM on 10-23-19 MONDAY  Remember: Instructions that are not followed completely may result in serious medical risk, up to and including death, or upon the discretion of your surgeon and anesthesiologist your surgery may need to be rescheduled.    _x___ 1. Do not eat food after midnight the night before your procedure. NO GUM OR CANDY AFTER MIDNIGHT. You may drink clear liquids up to 2 hours before you are scheduled to arrive at the hospital for your procedure.  Do not drink clear liquids within 2 hours of your scheduled arrival to the hospital.  Clear liquids include  --Water or Apple juice without pulp  --Gatorade  --Black Coffee or Clear Tea (No milk, no creamers, do not add anything to the coffee or Tea  _X___GATORADE G2-FINISH DRINK 2 HOURS PRIOR TO ARRIVAL TIME TO HOSPITAL DAY OF SURGERY    __x__ 2. No Alcohol for 24 hours before or after surgery.   __x__3. No Smoking or e-cigarettes for 24 prior to surgery.  Do not use any chewable tobacco products for at least 6 hour prior to surgery   ____  4. Bring all medications with you on the day of surgery if instructed.    __x__ 5. Notify your doctor if there is any change in your medical condition     (cold, fever, infections).    x___6. On the morning of surgery brush your teeth with toothpaste and water.  You may rinse your mouth with mouth wash if you wish.  Do not swallow any toothpaste or mouthwash.   Do not wear jewelry, make-up, hairpins, clips or nail polish.  Do not wear lotions, powders, or perfumes.   Do not shave 48 hours prior to surgery. Men may shave face and neck.  Do not bring valuables to the hospital.    [facility] is not responsible for any  belongings or valuables.               Contacts, dentures or bridgework may not be worn into surgery.  Leave your suitcase in the car. After surgery it may be brought to your room.  For patients admitted to the hospital, discharge time is determined by your treatment team.  _  Patients discharged the day of surgery will not be allowed to drive home.  You will need someone to drive you home and stay with you the night of your procedure.    Please read over the following fact sheets that you were given:   [facility] Preparing for Surgery/MRSA/INCENTIVE SPIROMETER INTSTRUCTIONS  _x___ TAKE THE FOLLOWING MEDICATION THE MORNING OF SURGERY WITH A SMALL SIP OF WATER. These include:  1. SYNTHROID (LEVOTHYROXINE)  2. KLONOPIN (CLONAZEPAM)  3. COLESTIPOL (COLESTID)  4. MAGNESIUM OXIDE  5. LEXAPRO (ESCITALOPRAM)  6. PROTONIX (PANTOPRAZOLE)  7. TAKE AN EXTRA PROTONIX THE NIGHT BEFORE YOUR SURGERY  ____Fleets enema or Magnesium Citrate as directed.   _x___ Use CHG Soap or sage wipes as directed on instruction sheet   ____ Use inhalers on the day of surgery and bring to hospital day of surgery  ____ Stop Metformin and Janumet 2 days prior to surgery.    ____ Take 1/2 of usual insulin dose the night before surgery and none on the  morning surgery.   ____ Follow recommendations from Cardiologist, Pulmonologist or PCP regarding stopping Aspirin, Coumadin, Plavix ,Eliquis, Effient, or Pradaxa, and Pletal.  X____Stop Anti-inflammatories such as Advil, Aleve, Ibuprofen, Motrin, Naproxen, Naprosyn, Goodies powders or aspirin products NOW-OK to take Tylenol    ____ Stop supplements until after surgery.    ____ Bring C-Pap to the hospital.

## 2019-10-17 NOTE — Pre-Procedure Instructions (Signed)
Loel Dubonnet, NP  Nurse Practitioner  Cardiology  Progress Notes     Signed  Encounter Date:  09/13/2019          Signed      Expand All Collapse All  Show:Clear all [x] Manual[x] Template[x] Copied  Added by: [x] Loel Dubonnet, NP  [] Hover for details   Office Visit    Patient Name: Debra Hale Date of Encounter: 09/13/2019  Primary Care Provider:  [name], MD Primary Cardiologist:  Kathlyn Sacramento, MD Electrophysiologist:  None   Chief Complaint    Debra Hale is a 69 y.o. female with a hx of recurrent pericarditis, HLD, thyroid carcinoma s/p surgery and XRT, colon cancer s/p laparoscopic right colectomy, nephrolithiasis, anxiety, OP, breast cancer, palpitations in the setting of hypokalemia/hypomagnesia presents today for cardiac clearance for procedure.    Past Medical History        Past Medical History:  Diagnosis Date   Anxiety    Arthritis    Osteoarthritis   Breast cancer ([place]) 2009   right breast lumpectomy with rad tx   Colon cancer ([place])    surgery with chemo and rad tx   GERD (gastroesophageal reflux disease)    Hypothyroidism    Liver disease    Liver nodule    s/p negative biopsy   Malignant neoplasm of thyroid gland ([place]) 2002   s/p surgery and XRT   Nephrolithiasis    s/p lithotripsy   Osteoporosis    Other and unspecified hyperlipidemia    Palpitations    Personal history of chemotherapy    Personal history of malignant neoplasm of large intestine    carcinoma - cecum, s/p right laparoscopic colectomy - s/p chemotherapy and XRT   Personal history of radiation therapy    Pure hypercholesterolemia    Unspecified hereditary and idiopathic peripheral neuropathy         Past Surgical History:  Procedure Laterality Date   APPENDECTOMY  1985   BREAST BIOPSY Right 2009   positive   BREAST BIOPSY Right 2009   negative   BREAST LUMPECTOMY Right 2009   breast  cancer   CHOLECYSTECTOMY  1995   COLONOSCOPY     COLONOSCOPY WITH PROPOFOL N/A 10/29/2017   Procedure: COLONOSCOPY WITH PROPOFOL;  Surgeon: Manya Silvas, MD;  Location: [facility] ENDOSCOPY;  Service: Endoscopy;  Laterality: N/A;   DILATION AND CURETTAGE OF UTERUS  1990   DILATION AND CURETTAGE, DIAGNOSTIC / THERAPEUTIC  1990   ESOPHAGOGASTRODUODENOSCOPY     ESOPHAGOGASTRODUODENOSCOPY (EGD) WITH PROPOFOL N/A 10/29/2017   Procedure: ESOPHAGOGASTRODUODENOSCOPY (EGD) WITH PROPOFOL;  Surgeon: Manya Silvas, MD;  Location: [facility] ENDOSCOPY;  Service: Endoscopy;  Laterality: N/A;   LAPAROSCOPIC PARTIAL COLECTOMY     stage 3-C carcinoma of the cecum, s/p chemotherapy and xrt   LITHOTRIPSY     SIGMOIDOSCOPY  08/26/1993   THYROID LOBECTOMY  2002   s/p XRT   Allergies       Allergies  Allergen Reactions   Demeclocycline Other (See Comments)    Throat swells   Tetracyclines & Related Other (See Comments)    Throat swells    Augmentin [Amoxicillin-Pot Clavulanate] Diarrhea   Bentyl [Dicyclomine Hcl]    Ciprofloxacin Diarrhea   Codeine Other (See Comments)    dizziness     Epinephrine    Flagyl [Metronidazole]    Librax [Chlordiazepoxide-Clidinium]    Phenobarbital    Prednisone     Nervous     Ultram [Tramadol] Other (See Comments)  Sick feeling    History of Present Illness    Debra Hale is a 69 y.o. female with a hx of recurrent pericarditis, HLD, thyroid carcinoma s/p surgery and XRT, colon cancer s/p laparoscopic right colectomy, nephrolithiasis, anxiety, OP, breast cancer, palpitations in the setting of hypokalemia/hypomagnesia. She was last seen 05/22/19 by Dr. Fletcher Anon.  Previous treadmill stress test 2017 with no evidence of ischemia with poor exercise tolerance and hypertensive respones to exercise. She had recurrent pericarditis and previous small pericardial effusion since 2017. Had suspected  pericarditis in setting of URI in 2020. Sedimentation rate up to 70. Echo 05/2019 with LVEF 50-55% without pericardial effusion. Treated with colchicine and NSAIDS.   Reports no shortness of breath nor dyspnea on exertion. No edema, orthopnea, PND.   Reports occasional palpitations that have been ongoing for many years and are stable at her baseline. Avoids "stimulants" such as caffeine and prednisone per her report as she notices they are better controlled.   Tells me her pain in her hip significantly limits her mobility. Exercise tolerance of <4 Mets. She reports an occasional fleeting chest pain that she attributes to anxiety.   EKGs/Labs/Other Studies Reviewed:   The following studies were reviewed today: ETT 29-Apr-2016  Blood pressure demonstrated a hypertensive response to exercise.  There was no ST segment deviation noted during stress.  No T wave inversion was noted during stress.  Normal treadmill stress test with no evidence of ischemia. Poor exercise capacity with hypertensive response to exercise  Echo 05/2018 - Left ventricle: The cavity size was normal. There was mild  concentric hypertrophy. Systolic function was normal. The  estimated ejection fraction was in the range of 50% to 55%. Wall  motion was normal; there were no regional wall motion  abnormalities. Doppler parameters are consistent with abnormal  left ventricular relaxation (grade 1 diastolic dysfunction).  - Pericardium, extracardiac: There was no pericardial effusion.    EKG:  EKG is ordered today.  The ekg ordered today demonstrates NSR 71 bpm with low voltage QRS and nonspecific ST/T wave changes. Overall flat t-waves. No acute ST/T wave changes. Stable compared to previous.   Recent Labs: 11/25/2018: ALT 33; BUN 19; Creatinine, Ser 0.99; Hemoglobin 14.9; Platelets 182.0; Potassium 3.9; Sodium 143; TSH 1.55  Recent Lipid Panel Labs (Brief)     Component Value Date/Time   CHOL 171  11/25/2018 1025   TRIG 103.0 11/25/2018 1025   HDL 51.80 11/25/2018 1025   CHOLHDL 3 11/25/2018 1025   VLDL 20.6 11/25/2018 1025   LDLCALC 99 11/25/2018 1025   LDLDIRECT 126.0 07/11/2015 0956      Home Medications   Active Medications      Current Meds  Medication Sig   clonazePAM (KLONOPIN) 0.5 MG tablet Take 1.5 mg by mouth 2 (two) times daily.    colestipol (COLESTID) 1 g tablet Take by mouth daily.    escitalopram (LEXAPRO) 20 MG tablet Take 20 mg by mouth daily.     ibuprofen (ADVIL,MOTRIN) 600 MG tablet Take 1 tablet (600 mg total) by mouth every 6 (six) hours as needed.   levothyroxine (SYNTHROID) 100 MCG tablet TAKE 1 TABLET BY MOUTH EVERY DAY   magnesium oxide (MAG-OX) 400 MG tablet TAKE 1 TABLET BY MOUTH EVERY DAY   pantoprazole (PROTONIX) 40 MG tablet TAKE 1 TABLET BY MOUTH EVERY DAY   potassium chloride (KLOR-CON) 10 MEQ tablet TAKE 1 TABLET BY MOUTH EVERY DAY      Current Facility-Administered Medications for the 09/13/19  encounter (Office Visit) with Loel Dubonnet, NP  Medication   ondansetron (ZOFRAN) tablet 8 mg      Review of Systems      Review of Systems  Constitution: Negative for chills, fever and malaise/fatigue.  Cardiovascular: Positive for chest pain ("occasional") and palpitations. Negative for dyspnea on exertion, leg swelling, near-syncope, orthopnea and syncope.  Respiratory: Negative for cough, shortness of breath and wheezing.   Musculoskeletal: Positive for joint pain (R hip).  Gastrointestinal: Negative for nausea and vomiting.  Neurological: Negative for dizziness, light-headedness and weakness.   All other systems reviewed and are otherwise negative except as noted above.  Physical Exam    VS:  BP 130/88 (BP Location: Left Arm, Patient Position: Sitting, Cuff Size: Normal)    Pulse 71    Ht 5\' 4"  (1.626 m)    Wt 151 lb (68.5 kg)    SpO2 99%    BMI 25.92 kg/m  , BMI Body mass index is 25.92 kg/m. GEN: Well  nourished, well developed, in no acute distress. HEENT: normal. Neck: Supple, no JVD, carotid bruits, or masses. Cardiac: RRR, no murmurs, rubs, or gallops. No clubbing, cyanosis, edema.  Radials/DP/PT 2+ and equal bilaterally.  Respiratory:  Respirations regular and unlabored, clear to auscultation bilaterally. GI: Soft, nontender, nondistended, BS + x 4. MS: No deformity or atrophy. Skin: Warm and dry, no rash. Neuro:  Strength and sensation are intact. Psych: Normal affect.  Assessment & Plan    1. Preoperative cardiovascular examination - Plans for upcoming hip replacement. Last ischemic eval by ETT in 2017 with noted poor exercise tolerance and hypertensive response to exercise. Reports occasional fleeting chest pain which she attributes to anxiety. Exercise tolerance of <4 METS due to hip pain. Hx of recurrent pericarditis. History of heart disease in her brother. Never smoker. Per Dr. Tyrell Antonio previous recommendation, proceed with [facility] for cardiac clearance. If low risk, proceed with hip surgery. If not low risk, plan for repeat office visit to discuss next steps.   2. Hx of recurrent pericarditis - Reports no recurrent symptoms. Echo 05/2018 LVEF 50-55%, no RWMA, gr1DD, no pericardial effusion.    3. Palpitations - Reports palpitations are well controlled. Continue potassium and magnesium as prescribed by PCP. EKG without arrhythmia.   Disposition: Lexiscan Myoview. Follow up in 6 month(s) with Dr. Fletcher Anon or APP.    Loel Dubonnet, NP 09/13/2019, 4:10 PM        Electronically signed by Loel Dubonnet, NP at 09/13/2019 5:02 PM  Office Visit on 09/13/2019   Office Visit on 09/13/2019     Detailed Report    Note shared with patient

## 2019-10-18 ENCOUNTER — Ambulatory Visit: Admission: RE | Admit: 2019-10-18 | Payer: PPO | Source: Home / Self Care | Admitting: Internal Medicine

## 2019-10-18 ENCOUNTER — Encounter: Admission: RE | Payer: Self-pay | Source: Home / Self Care

## 2019-10-18 ENCOUNTER — Encounter
Admission: RE | Admit: 2019-10-18 | Discharge: 2019-10-18 | Disposition: A | Discharge status: Home / Self Care | Payer: PPO | Source: Ambulatory Visit | Attending: Surgery | Admitting: Surgery

## 2019-10-18 DIAGNOSIS — Z01812 Encounter for preprocedural laboratory examination: Secondary | ICD-10-CM | POA: Insufficient documentation

## 2019-10-18 DIAGNOSIS — Z01818 Encounter for other preprocedural examination: Secondary | ICD-10-CM | POA: Diagnosis not present

## 2019-10-18 LAB — CBC WITH DIFFERENTIAL/PLATELET
Abs Immature Granulocytes: 0.01 10*3/uL (ref 0.00–0.07)
Basophils Absolute: 0 10*3/uL (ref 0.0–0.1)
Basophils Relative: 0 %
Eosinophils Absolute: 0 10*3/uL (ref 0.0–0.5)
Eosinophils Relative: 1 %
HCT: 42.1 % (ref 36.0–46.0)
Hemoglobin: 14.2 g/dL (ref 12.0–15.0)
Immature Granulocytes: 0 %
Lymphocytes Relative: 33 %
Lymphs Abs: 0.8 10*3/uL (ref 0.7–4.0)
MCH: 28.7 pg (ref 26.0–34.0)
MCHC: 33.7 g/dL (ref 30.0–36.0)
MCV: 85.2 fL (ref 80.0–100.0)
Monocytes Absolute: 0.2 10*3/uL (ref 0.1–1.0)
Monocytes Relative: 9 %
Neutro Abs: 1.4 10*3/uL — ABNORMAL LOW (ref 1.7–7.7)
Neutrophils Relative %: 57 %
Platelets: 164 10*3/uL (ref 150–400)
RBC: 4.94 MIL/uL (ref 3.87–5.11)
RDW: 13.6 % (ref 11.5–15.5)
WBC: 2.6 10*3/uL — ABNORMAL LOW (ref 4.0–10.5)
nRBC: 0 % (ref 0.0–0.2)

## 2019-10-18 LAB — COMPREHENSIVE METABOLIC PANEL
ALT: 39 U/L (ref 0–44)
AST: 19 U/L (ref 15–41)
Albumin: 3.7 g/dL (ref 3.5–5.0)
Alkaline Phosphatase: 120 U/L (ref 38–126)
Anion gap: 10 (ref 5–15)
BUN: 19 mg/dL (ref 8–23)
CO2: 26 mmol/L (ref 22–32)
Calcium: 8.7 mg/dL — ABNORMAL LOW (ref 8.9–10.3)
Chloride: 104 mmol/L (ref 98–111)
Creatinine, Ser: 1.05 mg/dL — ABNORMAL HIGH (ref 0.44–1.00)
GFR calc Af Amer: >60 mL/min (ref >60)
GFR calc non Af Amer: 54 mL/min — ABNORMAL LOW (ref >60)
Glucose, Bld: 101 mg/dL — ABNORMAL HIGH (ref 70–99)
Potassium: 3.9 mmol/L (ref 3.5–5.1)
Sodium: 140 mmol/L (ref 135–145)
Total Bilirubin: 0.8 mg/dL (ref 0.3–1.2)
Total Protein: 6.6 g/dL (ref 6.5–8.1)

## 2019-10-18 LAB — URINALYSIS, ROUTINE W REFLEX MICROSCOPIC
Bacteria, UA: NONE SEEN
Bilirubin Urine: NEGATIVE
Glucose, UA: NEGATIVE mg/dL
Ketones, ur: NEGATIVE mg/dL
Leukocytes,Ua: NEGATIVE
Nitrite: NEGATIVE
Protein, ur: NEGATIVE mg/dL
Specific Gravity, Urine: 1.008 (ref 1.005–1.030)
pH: 5 (ref 5.0–8.0)

## 2019-10-18 LAB — SURGICAL PCR SCREEN
MRSA, PCR: NEGATIVE
Staphylococcus aureus: NEGATIVE

## 2019-10-18 LAB — TYPE AND SCREEN
ABO/RH(D): A POS
Antibody Screen: NEGATIVE

## 2019-10-18 SURGERY — COLONOSCOPY WITH PROPOFOL
Anesthesia: General

## 2019-10-18 NOTE — Pre-Procedure Instructions (Signed)
Pre-Admit Testing Provider Notification Note  Provider Notified: Dr. Roland Rack  Notification Mode: Fax  Reason: Abnormal lab result.  Response: Fax confirmation received.   Additional Information: Placed on chart. Noted on Pre-Admit Worksheet.  Signed: Beulah Gandy, RN

## 2019-10-20 ENCOUNTER — Other Ambulatory Visit: Payer: Self-pay

## 2019-10-20 ENCOUNTER — Other Ambulatory Visit
Admission: RE | Admit: 2019-10-20 | Discharge: 2019-10-20 | Disposition: A | Discharge status: Home / Self Care | Payer: PPO | Source: Ambulatory Visit | Attending: Surgery | Admitting: Surgery

## 2019-10-20 DIAGNOSIS — Z01812 Encounter for preprocedural laboratory examination: Secondary | ICD-10-CM | POA: Insufficient documentation

## 2019-10-20 DIAGNOSIS — Z20822 Contact with and (suspected) exposure to covid-19: Secondary | ICD-10-CM | POA: Diagnosis not present

## 2019-10-20 LAB — SARS CORONAVIRUS 2 (TAT 6-24 HRS): SARS Coronavirus 2: NEGATIVE

## 2019-10-24 ENCOUNTER — Inpatient Hospital Stay: Payer: PPO

## 2019-10-24 ENCOUNTER — Encounter: Payer: Self-pay | Admitting: Surgery

## 2019-10-24 ENCOUNTER — Encounter
Admission: RE | Disposition: A | Discharge status: Skilled Nursing Facility | Payer: Self-pay | Source: Home / Self Care | Attending: Surgery

## 2019-10-24 ENCOUNTER — Inpatient Hospital Stay
Admission: RE | Admit: 2019-10-24 | Discharge: 2019-10-26 | DRG: 470 | Disposition: A | Discharge status: Skilled Nursing Facility | Payer: PPO | Attending: Surgery | Admitting: Surgery

## 2019-10-24 ENCOUNTER — Other Ambulatory Visit: Payer: Self-pay

## 2019-10-24 DIAGNOSIS — Z79899 Other long term (current) drug therapy: Secondary | ICD-10-CM

## 2019-10-24 DIAGNOSIS — Z8051 Family history of malignant neoplasm of kidney: Secondary | ICD-10-CM

## 2019-10-24 DIAGNOSIS — F329 Major depressive disorder, single episode, unspecified: Secondary | ICD-10-CM | POA: Diagnosis not present

## 2019-10-24 DIAGNOSIS — Z9221 Personal history of antineoplastic chemotherapy: Secondary | ICD-10-CM

## 2019-10-24 DIAGNOSIS — Z885 Allergy status to narcotic agent status: Secondary | ICD-10-CM | POA: Diagnosis not present

## 2019-10-24 DIAGNOSIS — Z79890 Hormone replacement therapy (postmenopausal): Secondary | ICD-10-CM

## 2019-10-24 DIAGNOSIS — Z471 Aftercare following joint replacement surgery: Secondary | ICD-10-CM | POA: Diagnosis not present

## 2019-10-24 DIAGNOSIS — E7800 Pure hypercholesterolemia, unspecified: Secondary | ICD-10-CM | POA: Diagnosis present

## 2019-10-24 DIAGNOSIS — Z853 Personal history of malignant neoplasm of breast: Secondary | ICD-10-CM

## 2019-10-24 DIAGNOSIS — Z96641 Presence of right artificial hip joint: Secondary | ICD-10-CM | POA: Diagnosis not present

## 2019-10-24 DIAGNOSIS — Z85038 Personal history of other malignant neoplasm of large intestine: Secondary | ICD-10-CM

## 2019-10-24 DIAGNOSIS — F419 Anxiety disorder, unspecified: Secondary | ICD-10-CM | POA: Diagnosis not present

## 2019-10-24 DIAGNOSIS — E559 Vitamin D deficiency, unspecified: Secondary | ICD-10-CM | POA: Diagnosis present

## 2019-10-24 DIAGNOSIS — Z20822 Contact with and (suspected) exposure to covid-19: Secondary | ICD-10-CM | POA: Diagnosis not present

## 2019-10-24 DIAGNOSIS — Z9011 Acquired absence of right breast and nipple: Secondary | ICD-10-CM

## 2019-10-24 DIAGNOSIS — E890 Postprocedural hypothyroidism: Secondary | ICD-10-CM | POA: Diagnosis present

## 2019-10-24 DIAGNOSIS — E785 Hyperlipidemia, unspecified: Secondary | ICD-10-CM | POA: Diagnosis not present

## 2019-10-24 DIAGNOSIS — M810 Age-related osteoporosis without current pathological fracture: Secondary | ICD-10-CM | POA: Diagnosis present

## 2019-10-24 DIAGNOSIS — M47816 Spondylosis without myelopathy or radiculopathy, lumbar region: Secondary | ICD-10-CM | POA: Diagnosis not present

## 2019-10-24 DIAGNOSIS — K449 Diaphragmatic hernia without obstruction or gangrene: Secondary | ICD-10-CM | POA: Diagnosis not present

## 2019-10-24 DIAGNOSIS — Z881 Allergy status to other antibiotic agents status: Secondary | ICD-10-CM

## 2019-10-24 DIAGNOSIS — N189 Chronic kidney disease, unspecified: Secondary | ICD-10-CM | POA: Diagnosis not present

## 2019-10-24 DIAGNOSIS — E039 Hypothyroidism, unspecified: Secondary | ICD-10-CM | POA: Diagnosis not present

## 2019-10-24 DIAGNOSIS — G629 Polyneuropathy, unspecified: Secondary | ICD-10-CM | POA: Diagnosis not present

## 2019-10-24 DIAGNOSIS — Z8701 Personal history of pneumonia (recurrent): Secondary | ICD-10-CM

## 2019-10-24 DIAGNOSIS — K760 Fatty (change of) liver, not elsewhere classified: Secondary | ICD-10-CM | POA: Diagnosis present

## 2019-10-24 DIAGNOSIS — Z87442 Personal history of urinary calculi: Secondary | ICD-10-CM

## 2019-10-24 DIAGNOSIS — D72819 Decreased white blood cell count, unspecified: Secondary | ICD-10-CM | POA: Diagnosis not present

## 2019-10-24 DIAGNOSIS — Z803 Family history of malignant neoplasm of breast: Secondary | ICD-10-CM

## 2019-10-24 DIAGNOSIS — G609 Hereditary and idiopathic neuropathy, unspecified: Secondary | ICD-10-CM | POA: Diagnosis not present

## 2019-10-24 DIAGNOSIS — Z85850 Personal history of malignant neoplasm of thyroid: Secondary | ICD-10-CM

## 2019-10-24 DIAGNOSIS — Z888 Allergy status to other drugs, medicaments and biological substances status: Secondary | ICD-10-CM

## 2019-10-24 DIAGNOSIS — M1611 Unilateral primary osteoarthritis, right hip: Secondary | ICD-10-CM | POA: Diagnosis not present

## 2019-10-24 DIAGNOSIS — K580 Irritable bowel syndrome with diarrhea: Secondary | ICD-10-CM | POA: Diagnosis not present

## 2019-10-24 DIAGNOSIS — K219 Gastro-esophageal reflux disease without esophagitis: Secondary | ICD-10-CM | POA: Diagnosis present

## 2019-10-24 DIAGNOSIS — Z923 Personal history of irradiation: Secondary | ICD-10-CM

## 2019-10-24 DIAGNOSIS — R1312 Dysphagia, oropharyngeal phase: Secondary | ICD-10-CM | POA: Diagnosis not present

## 2019-10-24 DIAGNOSIS — Z7401 Bed confinement status: Secondary | ICD-10-CM | POA: Diagnosis not present

## 2019-10-24 DIAGNOSIS — M2550 Pain in unspecified joint: Secondary | ICD-10-CM | POA: Diagnosis not present

## 2019-10-24 DIAGNOSIS — Z8261 Family history of arthritis: Secondary | ICD-10-CM

## 2019-10-24 HISTORY — PX: TOTAL HIP ARTHROPLASTY: SHX124

## 2019-10-24 LAB — ABO/RH: ABO/RH(D): A POS

## 2019-10-24 SURGERY — ARTHROPLASTY, HIP, TOTAL,POSTERIOR APPROACH
Anesthesia: Spinal | Site: Hip | Laterality: Right

## 2019-10-24 MED ORDER — METOCLOPRAMIDE HCL 10 MG PO TABS: 5.0000 mg | ORAL_TABLET | Freq: Three times a day (TID) | ORAL | Status: DC | PRN

## 2019-10-24 MED ORDER — PANTOPRAZOLE SODIUM 40 MG PO TBEC
40.0000 mg | DELAYED_RELEASE_TABLET | ORAL | Status: DC
  Administered 2019-10-25 – 2019-10-26 (×2): 40 mg via ORAL
  Filled 2019-10-24 (×2): qty 1

## 2019-10-24 MED ORDER — ONDANSETRON HCL 4 MG/2ML IJ SOLN: 4.0000 mg | Freq: Once | INTRAMUSCULAR | Status: DC | PRN

## 2019-10-24 MED ORDER — BISACODYL 10 MG RE SUPP: 10.0000 mg | Freq: Every day | RECTAL | Status: DC | PRN

## 2019-10-24 MED ORDER — PHENYLEPHRINE HCL (PRESSORS) 10 MG/ML IV SOLN
INTRAVENOUS | Status: DC | PRN
  Administered 2019-10-24: 50 ug via INTRAVENOUS
  Administered 2019-10-24: 100 ug via INTRAVENOUS
  Administered 2019-10-24 (×6): 50 ug via INTRAVENOUS
  Administered 2019-10-24: 100 ug via INTRAVENOUS
  Administered 2019-10-24: 50 ug via INTRAVENOUS

## 2019-10-24 MED ORDER — METOCLOPRAMIDE HCL 5 MG/ML IJ SOLN: 5.0000 mg | Freq: Three times a day (TID) | INTRAMUSCULAR | Status: DC | PRN

## 2019-10-24 MED ORDER — CLONAZEPAM 1 MG PO TABS
1.5000 mg | ORAL_TABLET | Freq: Two times a day (BID) | ORAL | Status: DC
  Administered 2019-10-24 – 2019-10-26 (×4): 1.5 mg via ORAL
  Filled 2019-10-24 (×4): qty 1

## 2019-10-24 MED ORDER — CLINDAMYCIN PHOSPHATE 900 MG/50ML IV SOLN
INTRAVENOUS | Status: AC
  Filled 2019-10-24: qty 50

## 2019-10-24 MED ORDER — POTASSIUM CHLORIDE CRYS ER 10 MEQ PO TBCR
10.0000 meq | EXTENDED_RELEASE_TABLET | Freq: Every day | ORAL | Status: DC
  Administered 2019-10-24 – 2019-10-26 (×3): 10 meq via ORAL
  Filled 2019-10-24 (×3): qty 1

## 2019-10-24 MED ORDER — ONDANSETRON HCL 4 MG/2ML IJ SOLN: 4.0000 mg | Freq: Four times a day (QID) | INTRAMUSCULAR | Status: DC | PRN

## 2019-10-24 MED ORDER — ORAL CARE MOUTH RINSE: 15.0000 mL | Freq: Once | OROMUCOSAL | Status: DC

## 2019-10-24 MED ORDER — PROPOFOL 500 MG/50ML IV EMUL
INTRAVENOUS | Status: DC | PRN
  Administered 2019-10-24: 120 ug/kg/min via INTRAVENOUS

## 2019-10-24 MED ORDER — MAGNESIUM OXIDE 400 (241.3 MG) MG PO TABS
400.0000 mg | ORAL_TABLET | ORAL | Status: DC
  Administered 2019-10-25 – 2019-10-26 (×2): 400 mg via ORAL
  Filled 2019-10-24 (×2): qty 1

## 2019-10-24 MED ORDER — LIDOCAINE HCL (CARDIAC) PF 100 MG/5ML IV SOSY
PREFILLED_SYRINGE | INTRAVENOUS | Status: DC | PRN
  Administered 2019-10-24: 60 mg via INTRAVENOUS

## 2019-10-24 MED ORDER — DIPHENHYDRAMINE HCL 12.5 MG/5ML PO ELIX: 12.5000 mg | ORAL_SOLUTION | ORAL | Status: DC | PRN

## 2019-10-24 MED ORDER — KETOROLAC TROMETHAMINE 15 MG/ML IJ SOLN
INTRAMUSCULAR | Status: AC
  Administered 2019-10-24: 15 mg via INTRAVENOUS
  Filled 2019-10-24: qty 1

## 2019-10-24 MED ORDER — FENTANYL CITRATE (PF) 100 MCG/2ML IJ SOLN: 25.0000 ug | INTRAMUSCULAR | Status: DC | PRN

## 2019-10-24 MED ORDER — BUPIVACAINE HCL (PF) 0.5 % IJ SOLN
INTRAMUSCULAR | Status: DC | PRN
  Administered 2019-10-24: 2.6 mL

## 2019-10-24 MED ORDER — FLEET ENEMA 7-19 GM/118ML RE ENEM: 1.0000 | ENEMA | Freq: Once | RECTAL | Status: DC | PRN

## 2019-10-24 MED ORDER — KETOROLAC TROMETHAMINE 15 MG/ML IJ SOLN
7.5000 mg | Freq: Four times a day (QID) | INTRAMUSCULAR | Status: AC
  Administered 2019-10-24 – 2019-10-25 (×3): 7.5 mg via INTRAVENOUS
  Filled 2019-10-24 (×3): qty 1

## 2019-10-24 MED ORDER — TRANEXAMIC ACID 1000 MG/10ML IV SOLN
INTRAVENOUS | Status: DC | PRN
  Administered 2019-10-24: 1000 mg via TOPICAL

## 2019-10-24 MED ORDER — PROPOFOL 500 MG/50ML IV EMUL
INTRAVENOUS | Status: AC
  Filled 2019-10-24: qty 50

## 2019-10-24 MED ORDER — LACTATED RINGERS IV SOLN: INTRAVENOUS | Status: DC

## 2019-10-24 MED ORDER — CHLORHEXIDINE GLUCONATE 0.12 % MT SOLN: 15.0000 mL | Freq: Once | OROMUCOSAL | Status: DC

## 2019-10-24 MED ORDER — ONDANSETRON HCL 4 MG/2ML IJ SOLN
INTRAMUSCULAR | Status: DC | PRN
  Administered 2019-10-24: 4 mg via INTRAVENOUS

## 2019-10-24 MED ORDER — CLINDAMYCIN PHOSPHATE 900 MG/50ML IV SOLN
900.0000 mg | INTRAVENOUS | Status: AC
  Administered 2019-10-24 (×2): 900 mg via INTRAVENOUS

## 2019-10-24 MED ORDER — OXYCODONE HCL 5 MG PO TABS
5.0000 mg | ORAL_TABLET | ORAL | Status: DC | PRN
  Administered 2019-10-24: 10 mg via ORAL
  Administered 2019-10-24 – 2019-10-26 (×7): 5 mg via ORAL
  Filled 2019-10-24: qty 1
  Filled 2019-10-24: qty 2
  Filled 2019-10-24 (×6): qty 1

## 2019-10-24 MED ORDER — ENOXAPARIN SODIUM 40 MG/0.4ML ~~LOC~~ SOLN
40.0000 mg | SUBCUTANEOUS | Status: DC
  Administered 2019-10-25 – 2019-10-26 (×2): 40 mg via SUBCUTANEOUS
  Filled 2019-10-24 (×2): qty 0.4

## 2019-10-24 MED ORDER — HYDROMORPHONE HCL 1 MG/ML IJ SOLN: 0.2500 mg | INTRAMUSCULAR | Status: DC | PRN

## 2019-10-24 MED ORDER — ONDANSETRON HCL 4 MG PO TABS: 4.0000 mg | ORAL_TABLET | Freq: Four times a day (QID) | ORAL | Status: DC | PRN

## 2019-10-24 MED ORDER — MIDAZOLAM HCL 2 MG/2ML IJ SOLN
INTRAMUSCULAR | Status: AC
  Filled 2019-10-24: qty 2

## 2019-10-24 MED ORDER — SODIUM CHLORIDE 0.9 % IV SOLN
INTRAVENOUS | Status: DC | PRN
  Administered 2019-10-24: 40 ug/min via INTRAVENOUS

## 2019-10-24 MED ORDER — SODIUM CHLORIDE 0.9 % IV SOLN
INTRAVENOUS | Status: DC | PRN
  Administered 2019-10-24: 60 mL

## 2019-10-24 MED ORDER — SODIUM CHLORIDE 0.9 % IV SOLN: INTRAVENOUS | Status: DC

## 2019-10-24 MED ORDER — KETOROLAC TROMETHAMINE 15 MG/ML IJ SOLN: 15.0000 mg | Freq: Once | INTRAMUSCULAR | Status: AC

## 2019-10-24 MED ORDER — MAGNESIUM HYDROXIDE 400 MG/5ML PO SUSP
30.0000 mL | Freq: Every day | ORAL | Status: DC | PRN
  Administered 2019-10-24 – 2019-10-26 (×2): 30 mL via ORAL
  Filled 2019-10-24 (×2): qty 30

## 2019-10-24 MED ORDER — BUPIVACAINE-EPINEPHRINE (PF) 0.5% -1:200000 IJ SOLN
INTRAMUSCULAR | Status: DC | PRN
  Administered 2019-10-24: 30 mL via PERINEURAL

## 2019-10-24 MED ORDER — ESCITALOPRAM OXALATE 10 MG PO TABS
20.0000 mg | ORAL_TABLET | ORAL | Status: DC
  Administered 2019-10-25 – 2019-10-26 (×2): 20 mg via ORAL
  Filled 2019-10-24 (×2): qty 2

## 2019-10-24 MED ORDER — BUPIVACAINE-EPINEPHRINE (PF) 0.5% -1:200000 IJ SOLN
INTRAMUSCULAR | Status: AC
  Filled 2019-10-24: qty 30

## 2019-10-24 MED ORDER — DOCUSATE SODIUM 100 MG PO CAPS
100.0000 mg | ORAL_CAPSULE | Freq: Two times a day (BID) | ORAL | Status: DC
  Administered 2019-10-24 – 2019-10-26 (×4): 100 mg via ORAL
  Filled 2019-10-24 (×4): qty 1

## 2019-10-24 MED ORDER — CLINDAMYCIN PHOSPHATE 600 MG/50ML IV SOLN
600.0000 mg | Freq: Four times a day (QID) | INTRAVENOUS | Status: AC
  Administered 2019-10-24 – 2019-10-25 (×3): 600 mg via INTRAVENOUS
  Filled 2019-10-24 (×3): qty 50

## 2019-10-24 MED ORDER — MIDAZOLAM HCL 2 MG/2ML IJ SOLN
INTRAMUSCULAR | Status: DC | PRN
Start: 2019-10-24 — End: 2019-10-24
  Administered 2019-10-24 (×2): 1 mg via INTRAVENOUS

## 2019-10-24 MED ORDER — CHLORHEXIDINE GLUCONATE 0.12 % MT SOLN
OROMUCOSAL | Status: AC
  Filled 2019-10-24: qty 15

## 2019-10-24 MED ORDER — BUPIVACAINE HCL (PF) 0.5 % IJ SOLN
INTRAMUSCULAR | Status: AC
  Filled 2019-10-24: qty 10

## 2019-10-24 MED ORDER — ACETAMINOPHEN 325 MG PO TABS
325.0000 mg | ORAL_TABLET | Freq: Four times a day (QID) | ORAL | Status: DC | PRN
  Administered 2019-10-25: 650 mg via ORAL
  Filled 2019-10-24: qty 2

## 2019-10-24 MED ORDER — ACETAMINOPHEN 500 MG PO TABS
1000.0000 mg | ORAL_TABLET | Freq: Four times a day (QID) | ORAL | Status: AC
  Administered 2019-10-24 – 2019-10-25 (×3): 1000 mg via ORAL
  Filled 2019-10-24 (×3): qty 2

## 2019-10-24 MED ORDER — LEVOTHYROXINE SODIUM 112 MCG PO TABS
112.0000 ug | ORAL_TABLET | Freq: Every day | ORAL | Status: DC
  Administered 2019-10-25 – 2019-10-26 (×2): 112 ug via ORAL
  Filled 2019-10-24 (×2): qty 1

## 2019-10-24 MED ORDER — COLESTIPOL HCL 1 G PO TABS
1.0000 g | ORAL_TABLET | ORAL | Status: DC
  Administered 2019-10-25: 1 g via ORAL
  Filled 2019-10-24 (×2): qty 1

## 2019-10-24 SURGICAL SUPPLY — 54 items
BLADE SAGITTAL WIDE XTHICK NO (BLADE) ×2 IMPLANT
BLADE SURG SZ20 CARB STEEL (BLADE) ×2 IMPLANT
CANISTER SUCT 1200ML W/VALVE (MISCELLANEOUS) ×2 IMPLANT
CANISTER SUCT 3000ML PPV (MISCELLANEOUS) ×4 IMPLANT
CHLORAPREP W/TINT 26 (MISCELLANEOUS) ×2 IMPLANT
COVER WAND RF STERILE (DRAPES) ×2 IMPLANT
DRAPE 3/4 80X56 (DRAPES) ×2 IMPLANT
DRAPE INCISE IOBAN 66X60 STRL (DRAPES) ×2 IMPLANT
DRAPE SURG 17X11 SM STRL (DRAPES) ×4 IMPLANT
DRSG OPSITE POSTOP 4X10 (GAUZE/BANDAGES/DRESSINGS) ×2 IMPLANT
DRSG OPSITE POSTOP 4X12 (GAUZE/BANDAGES/DRESSINGS) ×2 IMPLANT
ELECT BLADE 6.5 EXT (BLADE) ×2 IMPLANT
ELECT CAUTERY BLADE 6.4 (BLADE) ×2 IMPLANT
GLOVE BIO SURGEON STRL SZ7.5 (GLOVE) ×8 IMPLANT
GLOVE BIO SURGEON STRL SZ8 (GLOVE) ×8 IMPLANT
GLOVE BIOGEL PI IND STRL 8 (GLOVE) ×1 IMPLANT
GLOVE BIOGEL PI INDICATOR 8 (GLOVE) ×1
GLOVE INDICATOR 8.0 STRL GRN (GLOVE) ×2 IMPLANT
GOWN STRL REUS W/ TWL LRG LVL3 (GOWN DISPOSABLE) ×4 IMPLANT
GOWN STRL REUS W/ TWL XL LVL3 (GOWN DISPOSABLE) ×1 IMPLANT
GOWN STRL REUS W/TWL LRG LVL3 (GOWN DISPOSABLE) ×4
GOWN STRL REUS W/TWL XL LVL3 (GOWN DISPOSABLE) ×1
HEAD FEM -6XOFST 36XMDLR (Orthopedic Implant) ×1 IMPLANT
HEAD MODULAR 36MM (Orthopedic Implant) ×1 IMPLANT
HOOD PEEL AWAY FLYTE STAYCOOL (MISCELLANEOUS) ×14 IMPLANT
KIT TURNOVER KIT A (KITS) ×2 IMPLANT
LINER ACE G7 36 HIGH WALL (Liner) ×2 IMPLANT
NDL SAFETY ECLIPSE 18X1.5 (NEEDLE) ×2 IMPLANT
NEEDLE FILTER BLUNT 18X 1/2SAF (NEEDLE) ×1
NEEDLE FILTER BLUNT 18X1 1/2 (NEEDLE) ×1 IMPLANT
NEEDLE HYPO 18GX1.5 SHARP (NEEDLE) ×2
NEEDLE SPNL 20GX3.5 QUINCKE YW (NEEDLE) ×2 IMPLANT
PACK HIP PROSTHESIS (MISCELLANEOUS) ×2 IMPLANT
PIN STEINMAN 3/16 (PIN) ×2 IMPLANT
PIN STEINMANN 3/16X9 BAY 6PK (Pin) ×1 IMPLANT
PULSAVAC PLUS IRRIG FAN TIP (DISPOSABLE) ×2
SHELL ACETABULAR 3H G7 (Shell) ×2 IMPLANT
SOL .9 NS 3000ML IRR  AL (IV SOLUTION) ×1
SOL .9 NS 3000ML IRR UROMATIC (IV SOLUTION) ×1 IMPLANT
SPONGE LAP 18X18 RF (DISPOSABLE) ×2 IMPLANT
ST PIN 3/16X9 BAY 6PK (Pin) ×2 IMPLANT
STAPLER SKIN PROX 35W (STAPLE) ×2 IMPLANT
STEM COLLARLESS FULL 11X135 (Stem) ×2 IMPLANT
SUT TICRON 2-0 30IN 311381 (SUTURE) ×6 IMPLANT
SUT VIC AB 0 CT1 36 (SUTURE) ×2 IMPLANT
SUT VIC AB 1 CT1 36 (SUTURE) ×2 IMPLANT
SUT VIC AB 2-0 CT1 (SUTURE) ×8 IMPLANT
SYR 10ML LL (SYRINGE) ×2 IMPLANT
SYR 20ML LL LF (SYRINGE) ×2 IMPLANT
SYR 30ML LL (SYRINGE) ×4 IMPLANT
TAPE TRANSPORE STRL 2 31045 (GAUZE/BANDAGES/DRESSINGS) ×2 IMPLANT
TIP FAN IRRIG PULSAVAC PLUS (DISPOSABLE) ×1 IMPLANT
TRAY FOLEY MTR SLVR 16FR STAT (SET/KITS/TRAYS/PACK) IMPLANT
WATER STERILE IRR 1000ML POUR (IV SOLUTION) ×4 IMPLANT

## 2019-10-24 NOTE — Anesthesia Procedure Notes (Signed)
Spinal  Patient location during procedure: OR Start time: 10/24/2019 10:40 AM End time: 10/24/2019 10:47 AM Staffing Performed: other anesthesia staff  Anesthesiologist: Alvin Critchley, MD Other anesthesia staff: Nolon Lennert, RN Preanesthetic Checklist Completed: patient identified, IV checked, site marked, risks and benefits discussed, surgical consent, monitors and equipment checked, pre-op evaluation and timeout performed Spinal Block Patient position: sitting Prep: DuraPrep Patient monitoring: heart rate, cardiac monitor, continuous pulse ox and blood pressure Approach: midline Location: L3-4 Injection technique: single-shot Needle Needle type: Pencan  Needle gauge: 24 G Needle length: 9 cm Assessment Sensory level: T4

## 2019-10-24 NOTE — Op Note (Signed)
10/24/2019  1:02 PM  Patient:   Debra Hale  Pre-Op Diagnosis:   Degenerative joint disease, right hip.  Post-Op Diagnosis:   Same.  Procedure:   Right total hip arthroplasty.  Surgeon:   Pascal Lux, MD  Assistant:   Cameron Proud, PA-C; Kirkland Hun, PA-S  Anesthesia:   Spinal  Findings:   As above.  Complications:   None  EBL:   250 cc  Fluids:   1000 cc crystalloid  UOP:   None  TT:   None  Drains:   None  Closure:   Staples  Implants:   Biomet press-fit system with a #11 laterally offset Echo femoral stem, a 52 mm acetabular shell with an E-poly hi-wall liner, and a 36 mm chrome cobalt head with a -6 mm neck.  Brief Clinical Note:   The patient is a few 69 year old female with a history of progressively worsening right hip and groin pain. Her symptoms have progressed despite medications, activity modification, etc. Her history and examination are consistent with degenerative joint disease of the right hip, confirmed by plain radiographs. The patient presents at this time for a right total hip arthroplasty.   Procedure:   The patient was brought into the operating room. After adequate spinal anesthesia was obtained, the patient was repositioned in the left lateral decubitus position and secured using a lateral hip positioner. The right hip and lower extremity were prepped with ChloroPrep solution before being draped sterilely. Preoperative antibiotics were administered. A timeout was performed to verify the appropriate surgical site before a standard posterior approach to the hip was made through an approximately 4-5 inch incision. The incision was carried down through the subcutaneous tissues to expose the gluteal fascia and proximal end of the iliotibial band. These structures were split the length of the incision and the Charnley self-retaining hip retractor placed. The bursal tissues were swept posteriorly to expose the short external rotators. The anterior  border of the piriformis tendon was identified and this plane developed down through the capsule to enter the joint. A flap of tissue was elevated off the posterior aspect of the femoral neck and greater trochanter and retracted posteriorly. This flap included the piriformis tendon, the short external rotators, and the posterior capsule. The soft tissues were elevated off the lateral aspect of the ilium and a large Steinmann pin placed bicortically.   With the right leg aligned over the left, a drill bit was placed into the greater trochanter parallel to the Steinmann pin and the distance between these two pins measured in order to optimize leg lengths postoperatively. The drill bit was removed and the hip dislocated. The piriformis fossa was debrided of soft tissues before the intramedullary canal was accessed through this point using a triple step reamer. The canal was reamed sequentially beginning with a #7 tapered reamer and progressing to a #11 tapered reamer. This provided excellent circumferential chatter. Using the appropriate guide, a femoral neck cut was made 10-12 mm above the lesser trochanter. The femoral head was removed.  Attention was directed to the acetabular side. The labrum was debrided circumferentially before the ligamentum teres was removed using a large curette. A line was drawn on the drapes corresponding to the native version of the acetabulum. This line was used as a guide while the acetabulum was reamed sequentially beginning with a 45 mm reamer and progressing to a 51 mm reamer. This provided excellent circumferential chatter. The 52 mm trial acetabulum was positioned and found to  fit quite well. Therefore, the 52 mm acetabular shell was selected and impacted into place with care taken to maintain the appropriate version. The trial high wall liner was inserted.  Attention was redirected to the femoral side. A box osteotome was used to establish version before the canal was broached  sequentially beginning with a #7 broach and progressing to a #11 broach. This was left in place and several trial reductions performed using both a standard and laterally offset neck options, as well as the -6 mm neck length. After removing the trial components, the "manhole cover" was placed into the apex of the acetabular shell and tightened securely. The permanent E-polyethylene hi-wall liner was impacted into the acetabular shell and its locking mechanism verified using a quarter-inch osteotome. Next, the #11 laterally offset femoral stem was impacted into place with care taken to maintain the appropriate version. A repeat trial reduction was performed using the -6 mm and -3 mm neck lengths. The -6 mm neck length demonstrated excellent stability both in extension and external rotation as well as with flexion to 90 and internal rotation beyond 70. It also was stable in the position of sleep. In addition, leg lengths appeared to be restored appropriately, both by reassessing the position of the right leg over the left, as well as by measuring the distance between the Steinmann pin and the drill bit. The 36 mm chrome cobalt head with the -6 mm neck was impacted onto the stem of the femoral component. The Morse taper locking mechanism was verified using manual distraction before the head was relocated and placed through a range of motion with the findings as described above.  The wound was copiously irrigated with sterile saline solution via the jet lavage system before the peri-incisional and pericapsular tissues were injected with 30 cc of 0.5% Sensorcaine with epinephrine and 20 cc of Exparel diluted out to 60 cc with normal saline to help with postoperative analgesia. The posterior flap was reapproximated to the posterior aspect of the greater trochanter using #2 Tycron interrupted sutures placed through drill holes. Several additional #2 Tycron interrupted sutures were used to reinforce this layer of  closure. The iliotibial band was reapproximated using #1 Vicryl interrupted sutures before the gluteal fascia was closed using a running #1 Vicryl suture. At this point, 1 g of transexemic acid in 10 cc of normal saline was injected into the joint to help reduce postoperative bleeding. The subcutaneous tissues were closed in several layers using 2-0 Vicryl interrupted sutures before the skin was closed using staples. A sterile occlusive dressing was applied to the wound. The patient was then rolled back into the supine position on his/her hospital bed before being awakened and returned to the recovery room in satisfactory condition after tolerating the procedure well.

## 2019-10-24 NOTE — H&P (Signed)
History of Present Illness:  Debra Hale is a 69 y.o. female who presents for follow-up of her right hip pain secondary to degenerative joint disease. The patient was last seen for the symptoms 3 months ago. She notes that her symptoms have worsened since then. She rates her pain today at 5/10. She has tried viscosupplementation injections by Dr. Candelaria Stagers under ultrasound guidance which she did not feel provided any benefit whatsoever. She has been taking ibuprofen with limited benefit. She has pain with any attempted standing or ambulation, and she feels that the more she walks the worse it gets. She has difficulty reciprocating stairs. She also has pain at night. She denies any reinjury to the hip, and denies any numbness or paresthesias down her leg to her foot. She is quite frustrated by her symptoms and functional limitations, and is ready to consider more aggressive treatment options.  Current Outpatient Medications: . clonazePAM (KLONOPIN) 0.5 MG tablet 2 (two) times daily  . colestipoL (COLESTID) 1 gram tablet Take 1 tablet (1 g total) by mouth once daily May increase dose to twice daily if needed. 60 tablet 3  . cyanocobalamin (VITAMIN B12) 1,000 mcg/mL injection Inject into the muscle monthly  . ergocalciferol, vitamin D2, (VITAMIN D2 ORAL) Take 50,000 Units by mouth once a week  . escitalopram oxalate (LEXAPRO) 20 MG tablet Take 20 mg by mouth every morning. 5  . ibuprofen (MOTRIN) 200 MG tablet Take 200 mg by mouth every 6 (six) hours as needed for Pain  . KLOR-CON 10 10 mEq ER tablet Take 10 mEq by mouth once daily 1  . levothyroxine (SYNTHROID) 112 MCG tablet Take 1 tablet (112 mcg total) by mouth daily  . magnesium oxide (MAG-OX) 400 mg (241.3 mg magnesium) tablet TAKE 1 TABLET BY MOUTH EVERY DAY  . nystatin (MYCOSTATIN) 100,000 unit/mL suspension Swish and swallow 500,000 Units 3 (three) times a day  . ondansetron (ZOFRAN) 4 MG tablet Take 4 mg by mouth every 8 (eight) hours as  needed  . pantoprazole (PROTONIX) 40 MG DR tablet Take 40 mg by mouth once daily  . polyethylene glycol (MIRALAX) powder Take as directed for colonic prep. 238 g 0  . triamcinolone 0.1 % cream Apply topically 2 (two) times daily Do not use longer than 2 consecutive weeks. 80 g 0   No current Epic-ordered facility-administered medications on file.   Allergies:  . Augmentin [Amoxicillin-Pot Clavulanate] Unknown  . Bentyl [Dicyclomine] Unknown  . Ciprofloxacin Diarrhea  . Codeine Dizziness  . Demeclocycline Other (See Comments)  Throat swells  . Epinephrine Unknown  . Flagyl [Metronidazole Hcl] Unknown  . Librax (With Clidinium) [Chlordiazepoxide-Clidinium] Unknown  . Phenobarbital Unknown  . Prednisone Anxiety  Nervous  . Tetracycline Swelling  Throat swelling  . Ultram [Tramadol] Nausea  Sick feeling   Past Medical History:  . Acute pericarditis  . Anxiety  Panic attacks  . Breast cancer (CMS-HCC)  . Chronic kidney disease  . Colon cancer (CMS-HCC)  . Fatty liver disease, nonalcoholic  6144: ACSMA 6, AMA 3.5, ANA, Neg, Hep ABC panel negative  . GERD (gastroesophageal reflux disease)  . H/O lithotripsy  . Hemorrhage of rectum and anus  . History of bone density study 05/20/2010  . Hypothyroid, unspecified  . Liver disease  . Liver nodule  Negative liver biopsy  . Nephrolithiasis  . Osteoarthritis  . Osteoporosis  . Palpitations  . Personal history of colon cancer, stage III  Of cecum, had Lap right colectomy. Has had Chemotherapy  and radiation.  . Personal history of colonic polyps  . Personal history of malignant neoplasm of breast 2009  Right breast [id number]. 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 ER/PR positive, Her/2-neu negative. Surgery by Dr. Loreli Dollar with [facility]. She has received chemotherapy and subsequent  radiation therapy by Dr. Ma Hillock at [facility]  . Personal history of malignant neoplasm of thyroid 2002  Surgery and radiation therapy  . Pleural effusion associated with pulmonary infection  . Pure hypercholesterolemia  . Thyroid cancer (CMS-HCC)  . Ulcer  . Unspecified hypothyroidism   Past Surgical History:  . APPENDECTOMY  . APPENDECTOMY 1985  . BREAST EXCISIONAL BIOPSY  x2  . CHOLECYSTECTOMY  . COLONOSCOPY 04/03/2004  Adenomatous Polyp w/high grade dysplasia  . COLONOSCOPY 06/29/2005  [facility]  . COLONOSCOPY 09/21/2007  Adenomatous Polyp, [facility]  . COLONOSCOPY 07/29/2009  [facility]  . COLONOSCOPY 05/18/2011  [facility]  . COLONOSCOPY 08/17/2011  Adenomatous Polyp, [facility]: [facility] 08/2014; OV made 07/03/2014 @ 11am w/Kim Jerelene Redden NP  . COLONOSCOPY 08/02/2014  Adenomatous Polyps, [facility]: [facility] 07/2017; Recall Ltr mailed 06/22/2017 (dh)  . COLONOSCOPY 10/29/2017  Tubulovillous Abenoma: [facility] 10/2018 Recall ltr mailed  . DILATION AND CURETTAGE, DIAGNOSTIC / THERAPEUTIC 1990  . EGD 05/18/2011, 04/03/2004  No repeat per RTE  . EGD 10/29/2017  Normal: No repeat per RTE  . LAPAROSCOPIC COLECTOMY PARTIAL W/ANASTAMOSIS Right  due to cancer of cecum  . LAPAROSCOPIC COLECTOMY PARTIAL W/ANASTAMOSIS  . LITHOTRIPSY  . MASTECTOMY PARTIAL Right 2009  Right partial mastectomy with sentinel lymph node biopsy by Dr. Loreli Dollar, [facility]  . SIGMOIDOSCOPY 08/26/1993  . THYROIDECTOMY W/LIMITED NECK DISSECTION 2002  Status post thyroid lobectomy and radioactive iodine secondary to thyroid cancer 2002   Family History:  . Lung cancer Father  . Colon cancer Father  . Prostate cancer Father  . Kidney cancer Other  died at age 18  . Brain cancer Other 55  . Alzheimer's disease Mother  . Cancer Mother  . Stroke Mother  . Arthritis Mother  . Lung cancer Sister  . Breast cancer Sister  . Cancer Sister  . Breast cancer Maternal Aunt   Social History: Socioeconomic History  . Marital status: Divorced   Spouse name: Not on file  . Number of children: Not on file  . Years of education: Not on file  . Highest education level: Not on file  Occupational History  . Occupation: Retired  Tobacco Use  . Smoking status: Never Smoker  . Smokeless tobacco: Never Used  Vaping Use  . Vaping Use: Never used  Substance and Sexual Activity  . Alcohol use: Never  . Drug use: Never  . Sexual activity: Defer  Other Topics Concern  . Not on file  Social History Narrative  ** Merged History Encounter **    Social Determinants of Health   Financial Resource Strain:  . Difficulty of Paying Living Expenses:  Food Insecurity:  . Worried About Charity fundraiser in the Last Year:  . Arboriculturist in the Last Year:  Transportation Needs:  . Film/video editor (Medical):  Marland Kitchen Lack of Transportation (Non-Medical):   Review of Systems:  A comprehensive 14 point ROS was performed, reviewed, and the pertinent orthopaedic findings are documented in the HPI.  Physical Exam: Vitals:  10/06/19 0904  BP: 132/82  Weight: 67.9 kg (149 lb 12.8  oz)  Height: 163.8 cm (5' 4.5")  PainSc: 5  PainLoc: Hip   General/Constitutional: The patient appears to be well-nourished, well-developed, and in no acute distress. Neuro/Psych: Normal mood and affect, oriented to person, place and time. Eyes: Non-icteric. Pupils are equal, round, and reactive to light, and exhibit synchronous movement. ENT: Unremarkable. Lymphatic: No palpable adenopathy. Respiratory: Lungs clear to auscultation, Normal chest excursion, No wheezes and Non-labored breathing Cardiovascular: Regular rate and rhythm. No murmurs. and No edema, swelling or tenderness, except as noted in detailed exam. Integumentary: No impressive skin lesions present, except as noted in detailed exam. Musculoskeletal: Unremarkable, except as noted in detailed exam.  Lumbar exam: Skin inspection of the lower back again is unremarkable. In stance, her spine  is straight and her pelvis is level. She is able to arise from a seated position with some difficulty, and ambulates with a mildly antalgic gait, favoring her right leg, but does not use any assistive devices. She has no tenderness along the thoracic or lumbar spine, nor across the sacrum. She experiences mild pain over the right greater trochanteric region, but has no tenderness over either SI joint, either sciatic notch region, or the left trochanteric region. She is able to forward flex to 75 degrees and extend to 15 degrees with mild discomfort in flexion. She can heel raise and toe raise without difficulty or weakness. She has negative sitting straight leg raises bilaterally.   Right hip pain: She demonstrates flexion to 95 degrees, internal rotation to 10 degrees, and external rotation to 25 degrees on the right and flexion to 105 degrees, internal rotation to 20 degrees, and external rotation to 35 degrees on the left. She has moderate pain with right hip external rotation more so than with internal rotation or flexion. She remains neurovascularly intact to both lower extremities.   X-rays/MRI/Lab data:  Recent x-rays of the pelvis and right hip are available for review and have been reviewed by myself. These films demonstrate progressive degenerative changes of the right hip as manifest by increased joint space narrowing superiorly with subchondral cystic changes and lateral femoral and acetabular osteophytes. No acute bony abnormalities are identified.  Assessment: . Primary osteoarthritis of right hip   Plan: The treatment options were discussed with the patient. In addition, patient educational materials were provided regarding the diagnosis and treatment options. The patient is quite frustrated by her symptoms and function limitations, and is ready to consider more aggressive treatment options. Therefore, I have recommended a surgical procedure, specifically a right total hip arthroplasty.  The procedure was discussed with the patient, as were the potential risks (including bleeding, infection, nerve and/or blood vessel injury, persistent or recurrent pain, loosening and/or failure of the components, dislocation, leg length inequality, need for further surgery, blood clots, strokes, heart attacks and/or arhythmias, pneumonia, etc.) and benefits. The patient states her understanding and wishes to proceed. All of the patient's questions and concerns were answered. She can call any time with further concerns. She will follow up post-surgery, routine.    H&P reviewed and patient re-examined. No changes.

## 2019-10-24 NOTE — Anesthesia Preprocedure Evaluation (Signed)
Anesthesia Evaluation  Patient identified by MRN, date of birth, ID band Patient awake    Reviewed: Allergy & Precautions, NPO status , Patient's Chart, lab work & pertinent test results  History of Anesthesia Complications (+) history of anesthetic complications  Airway Mallampati: II  TM Distance: >3 FB     Dental   Pulmonary neg pulmonary ROS,    Pulmonary exam normal        Cardiovascular negative cardio ROS Normal cardiovascular exam+ dysrhythmias      Neuro/Psych  Headaches, PSYCHIATRIC DISORDERS Anxiety Depression  Neuromuscular disease    GI/Hepatic hiatal hernia, GERD  ,  Endo/Other  Hypothyroidism   Renal/GU   negative genitourinary   Musculoskeletal  (+) Arthritis ,   Abdominal Normal abdominal exam  (+)   Peds negative pediatric ROS (+)  Hematology negative hematology ROS (+)   Anesthesia Other Findings Past Medical History: No date: Anxiety No date: Arthritis     Comment:  Osteoarthritis 2009: Breast cancer ([place])     Comment:  right breast lumpectomy with rad tx No date: Colon cancer ([place])     Comment:  surgery with chemo and rad tx No date: GERD (gastroesophageal reflux disease) No date: Hypothyroidism No date: Liver disease No date: Liver nodule     Comment:  s/p negative biopsy 2002: Malignant neoplasm of thyroid gland (HCC)     Comment:  s/p surgery and XRT No date: Nephrolithiasis     Comment:  s/p lithotripsy No date: Osteoporosis No date: Other and unspecified hyperlipidemia No date: Palpitations No date: Personal history of chemotherapy No date: Personal history of malignant neoplasm of large intestine     Comment:  carcinoma - cecum, s/p right laparoscopic colectomy -               s/p chemotherapy and XRT No date: Personal history of radiation therapy No date: Pure hypercholesterolemia No date: Unspecified hereditary and idiopathic peripheral neuropathy   Reproductive/Obstetrics                             Anesthesia Physical  Anesthesia Plan  ASA: II  Anesthesia Plan:    Post-op Pain Management:    Induction: Intravenous  PONV Risk Score and Plan:   Airway Management Planned:   Additional Equipment:   Intra-op Plan:   Post-operative Plan:   Informed Consent: I have reviewed the patients History and Physical, chart, labs and discussed the procedure including the risks, benefits and alternatives for the proposed anesthesia with the patient or authorized representative who has indicated his/her understanding and acceptance.     Dental advisory given  Plan Discussed with: CRNA and Surgeon  Anesthesia Plan Comments:         Anesthesia Quick Evaluation

## 2019-10-24 NOTE — Progress Notes (Addendum)
PT Cancellation Note  Patient Details Name: Debra Hale MRN: [id number] DOB: 04-28-1951   Cancelled Treatment:    Reason Eval/Treat Not Completed: Medical issues which prohibited therapy Attempted to see pt at 16:24, still with R LE numbness and only trace toe/ankle movement. Attempted again at 17:00 numbness remains.  Will hold PT this date and initiate care tomorrow AM.     Kreg Shropshire, DPT 10/24/2019, 4:25 PM

## 2019-10-24 NOTE — TOC Progression Note (Signed)
Transition of Care [facility]) - Progression Note    Patient Details  Name: Debra Hale MRN: [id number] Date of Birth: 07-23-1950  Transition of Care [facility]) CM/SW [place], RN Phone Number: 10/24/2019, 3:26 PM  Clinical Narrative:    Requested the price of Lovenox 40 mg daily Will notify the patient of the cost once obtained The patient is set up with [facility] services        Expected Discharge Plan and Services                                                 Social Determinants of Health (SDOH) Interventions    Readmission Risk Interventions No flowsheet data found.

## 2019-10-24 NOTE — Progress Notes (Signed)
15 minute call to floor. 

## 2019-10-24 NOTE — Progress Notes (Deleted)
Patient informed Probation officer that she had to leave hospital tonight. States she has been here too long and she needs to care for her kids and other family situations. Informed her that  we were awaiting blood culture results. Patient states she doesn't  care about results and if they are positive she will come back for outpatient tx. NP, Sharion Settler informed of patient's intention to leave AMA. NP in to talk with   patient. Patient adamant about leaving, resulting in [facility] papers signed by NP and nurse. Patient left room AMA at 2030 without IV access and in good condition.

## 2019-10-24 NOTE — Transfer of Care (Signed)
Immediate Anesthesia Transfer of Care Note  Patient: [name]  Procedure(s) Performed: TOTAL HIP ARTHROPLASTY (Right Hip)  Patient Location: PACU  Anesthesia Type:Spinal  Level of Consciousness: drowsy and patient cooperative  Airway & Oxygen Therapy: Patient Spontanous Breathing and Patient connected to face mask oxygen  Post-op Assessment: Report given to RN and Post -op Vital signs reviewed and stable  Post vital signs: Reviewed and stable  Last Vitals:  Vitals Value Taken Time  BP 98/60 10/24/19 1306  Temp 36.9 C 10/24/19 1306  Pulse 84 10/24/19 1308  Resp 22 10/24/19 1308  SpO2 100 % 10/24/19 1308  Vitals shown include unvalidated device data.  Last Pain:  Vitals:   10/24/19 0854  TempSrc: Tympanic  PainSc: 7          Complications: No complications documented.

## 2019-10-25 ENCOUNTER — Encounter: Payer: Self-pay | Admitting: Surgery

## 2019-10-25 LAB — BASIC METABOLIC PANEL
Anion gap: 6 (ref 5–15)
BUN: 21 mg/dL (ref 8–23)
CO2: 28 mmol/L (ref 22–32)
Calcium: 7.4 mg/dL — ABNORMAL LOW (ref 8.9–10.3)
Chloride: 105 mmol/L (ref 98–111)
Creatinine, Ser: 1.21 mg/dL — ABNORMAL HIGH (ref 0.44–1.00)
GFR calc Af Amer: 53 mL/min — ABNORMAL LOW (ref >60)
GFR calc non Af Amer: 46 mL/min — ABNORMAL LOW (ref >60)
Glucose, Bld: 135 mg/dL — ABNORMAL HIGH (ref 70–99)
Potassium: 4 mmol/L (ref 3.5–5.1)
Sodium: 139 mmol/L (ref 135–145)

## 2019-10-25 NOTE — Plan of Care (Signed)
  Problem: Activity: Goal: Ability to avoid complications of mobility impairment will improve 10/25/2019 0231 by Gabriel Rung, RN Outcome: Progressing 10/25/2019 0231 by Gabriel Rung, RN Outcome: Progressing Goal: Ability to tolerate increased activity will improve 10/25/2019 0231 by Gabriel Rung, RN Outcome: Progressing 10/25/2019 0231 by Gabriel Rung, RN Outcome: Progressing

## 2019-10-25 NOTE — TOC Benefit Eligibility Note (Signed)
Transition of Care [facility]) Benefit Eligibility Note    Patient Details  Name: Debra Hale MRN: [id number] Date of Birth: 1950-12-22   Medication/Dose: Enoxaparin 40mg  once daily for 14 days  Covered?: Yes  Tier: Other (Tier 4)  Prescription Coverage Preferred Pharmacy: [facility]  Spoke with Person/Company/Phone Number:: Mardene Celeste with [employer] at [phone]  Co-Pay: $90 estimated copay  Prior Approval: No  Deductible:  (No deductible)    Debra Hale Phone Number: [phone] or [phone] 10/25/2019, 10:05 AM

## 2019-10-25 NOTE — Evaluation (Signed)
Physical Therapy Evaluation Patient Details Name: Debra Hale MRN: [id number] DOB: June 08, 1950 Today's Date: 10/25/2019   History of Present Illness  Pt is a 69 yo female s/p R THA, posterior approach, WBAT. 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    Clinical Impression  Pt alert, in bed, reported 4/10 R hip pain. At baseline pt stated she is independent for IADLs/ADLS, no falls, no AD for ambulation. Pt lives alone in a third floor condo, unable to obtain caregiver assistance after surgery.  The patient was able to perform supine therapeutic exercises with AAROM for RLE due to pain. Supine to sit with CGA and HOB elevated, fair balance noted. Sit <> stand with CGA and RW, and step by step sequencing provided during transfers and short ambulation distance to recliner in room. Pt exhibited antalgic gait, decreased step length, utilized step to gait pattern. PT and pt reviewed posterior hip precautions as well, pt expressed anxiety, active listening and encouragement provided.  Overall the patient demonstrated deficits (see "PT Problem List") that impede the patient's functional abilities, safety, and mobility and would benefit from skilled PT intervention. Recommendation is SNF due to functional status, current level of assistance needed, decreased caregiver support and inaccessible home environment.     Follow Up Recommendations SNF    Equipment Recommendations  Rolling walker with 5" wheels;3in1 (PT)    Recommendations for Other Services OT consult     Precautions / Restrictions Precautions Precautions: Posterior Hip Precaution Booklet Issued: Yes (comment) Restrictions Weight Bearing Restrictions: Yes RLE Weight Bearing: Weight bearing as tolerated      Mobility  Bed Mobility Overal bed mobility: Needs Assistance Bed  Mobility: Supine to Sit     Supine to sit: Min guard;HOB elevated        Transfers Overall transfer level: Needs assistance Equipment used: Rolling walker (2 wheeled) Transfers: Sit to/from Stand Sit to Stand: Min guard;From elevated surface            Ambulation/Gait Ambulation/Gait assistance: Min guard Gait Distance (Feet): 3 Feet Assistive device: Rolling walker (2 wheeled)       General Gait Details: step to gait pattern, step by step cueing for sequencing movements, decreased weight bearing on RLE noted  Stairs            Wheelchair Mobility    Modified Rankin (Stroke Patients Only)       Balance Overall balance assessment: Needs assistance Sitting-balance support: Feet supported Sitting balance-Leahy Scale: Fair       Standing balance-Leahy Scale: Poor                               Pertinent Vitals/Pain Pain Assessment: 0-10 Pain Score: 4  Pain Location: R hip Pain Descriptors / Indicators: Aching;Sore Pain Intervention(s): Limited activity within patient's tolerance;Monitored during session;Premedicated before session;Repositioned    Home Living Family/patient expects to be discharged to:: Private residence Living Arrangements: Alone Available Help at Discharge: Friend(s);Available PRN/intermittently Type of Home: House Home Access: Stairs to enter Entrance Stairs-Rails: Left Entrance Stairs-Number of Steps: 3rd floor apartment Home Layout: One level Home Equipment: None      Prior Function Level of Independence: Independent               Hand Dominance        Extremity/Trunk  Assessment   Upper Extremity Assessment Upper Extremity Assessment: Overall WFL for tasks assessed    Lower Extremity Assessment Lower Extremity Assessment: RLE deficits/detail;LLE deficits/detail RLE Deficits / Details: s/p THA LLE Deficits / Details: WFLs    Cervical / Trunk Assessment Cervical / Trunk Assessment: Normal   Communication   Communication: No difficulties  Cognition Arousal/Alertness: Awake/alert Behavior During Therapy: WFL for tasks assessed/performed Overall Cognitive Status: Within Functional Limits for tasks assessed                                        General Comments      Exercises Total Joint Exercises Ankle Circles/Pumps: AROM;Both;10 reps Quad Sets: AROM;Right;Strengthening;10 reps Heel Slides: AAROM;Strengthening;Right;10 reps Hip ABduction/ADduction: AAROM;Strengthening;Right;10 reps   Assessment/Plan    PT Assessment Patient needs continued PT services  PT Problem List Decreased strength;Decreased mobility;Decreased range of motion;Decreased safety awareness;Decreased knowledge of precautions;Decreased activity tolerance;Decreased balance;Decreased knowledge of use of DME;Pain       PT Treatment Interventions DME instruction;Therapeutic exercise;Gait training;Balance training;Stair training;Neuromuscular re-education;Functional mobility training;Therapeutic activities;Patient/family education    PT Goals (Current goals can be found in the Care Plan section)  Acute Rehab PT Goals Patient Stated Goal: to go home PT Goal Formulation: With patient Time For Goal Achievement: 11/08/19 Potential to Achieve Goals: Good    Frequency BID   Barriers to discharge Decreased caregiver support;Inaccessible home environment      Co-evaluation               AM-PAC PT "6 Clicks" Mobility  Outcome Measure Help needed turning from your back to your side while in a flat bed without using bedrails?: A Little Help needed moving from lying on your back to sitting on the side of a flat bed without using bedrails?: A Little Help needed moving to and from a bed to a chair (including a wheelchair)?: A Little Help needed standing up from a chair using your arms (e.g., wheelchair or bedside chair)?: A Little Help needed to walk in hospital room?: A Lot Help  needed climbing 3-5 steps with a railing? : A Lot 6 Click Score: 16    End of Session Equipment Utilized During Treatment: Gait belt Activity Tolerance: Patient tolerated treatment well Patient left: with chair alarm set;in chair;with call bell/phone within reach;with SCD's reapplied Nurse Communication: Mobility status PT Visit Diagnosis: Other abnormalities of gait and mobility (R26.89);Muscle weakness (generalized) (M62.81);Pain;Difficulty in walking, not elsewhere classified (R26.2) Pain - Right/Left: Right Pain - part of body: Hip    Time: 8768-1157 PT Time Calculation (min) (ACUTE ONLY): 41 min   Charges:   PT Evaluation $PT Eval Low Complexity: 1 Low PT Treatments $Therapeutic Exercise: 23-37 mins        Lieutenant Diego PT, DPT 1:31 PM,10/25/19

## 2019-10-25 NOTE — Anesthesia Postprocedure Evaluation (Signed)
Anesthesia Post Note  Patient: Debra Hale  Procedure(s) Performed: TOTAL HIP ARTHROPLASTY (Right Hip)  Patient location during evaluation: Nursing Unit Anesthesia Type: Spinal Level of consciousness: oriented and awake and alert Pain management: pain level controlled Vital Signs Assessment: post-procedure vital signs reviewed and stable Respiratory status: spontaneous breathing and respiratory function stable Cardiovascular status: blood pressure returned to baseline and stable Postop Assessment: no headache, no backache, no apparent nausea or vomiting and patient able to bend at knees Anesthetic complications: no   No complications documented.   Last Vitals:  Vitals:   10/24/19 2046 10/24/19 2326  BP: 119/67 106/62  Pulse: 81 87  Resp: 17 15  Temp: 36.8 C 36.9 C  SpO2: 97% 94%    Last Pain:  Vitals:   10/24/19 2326  TempSrc: Oral  PainSc:                  Alison Stalling

## 2019-10-25 NOTE — Progress Notes (Signed)
Subjective: 1 Day Post-Op Procedure(s) (LRB): TOTAL HIP ARTHROPLASTY (Right) Patient reports pain as 4 on 0-10 scale.   Patient is well, and has had no acute complaints or problems Plan is to go Skilled nursing facility after hospital stay. Negative for chest pain and shortness of breath Fever: no Gastrointestinal:Negative for nausea and vomiting Patient was unable to work with PT yesterday due to numbness in the right leg, numbness has resolved.  Objective: Vital signs in last 24 hours: Temp:  [97.7 F (36.5 C)-98.5 F (36.9 C)] 98.4 F (36.9 C) (06/22 2326) Pulse Rate:  [68-87] 87 (06/22 2326) Resp:  [11-18] 15 (06/22 2326) BP: (98-137)/(47-86) 106/62 (06/22 2326) SpO2:  [94 %-100 %] 94 % (06/22 2326) Weight:  [68 kg] 68 kg (06/22 0854)  Intake/Output from previous day:  Intake/Output Summary (Last 24 hours) at 10/25/2019 0752 Last data filed at 10/25/2019 0300 Gross per 24 hour  Intake 1797.68 ml  Output 750 ml  Net 1047.68 ml    Intake/Output this shift: No intake/output data recorded.  Labs: No results for input(s): HGB in the last 72 hours. No results for input(s): WBC, RBC, HCT, PLT in the last 72 hours. Recent Labs    10/25/19 0526  NA 139  K 4.0  CL 105  CO2 28  BUN 21  CREATININE 1.21*  GLUCOSE 135*  CALCIUM 7.4*   No results for input(s): LABPT, INR in the last 72 hours.   EXAM General - Patient is Alert, Appropriate and Oriented Extremity - ABD soft Sensation intact distally Intact pulses distally Dorsiflexion/Plantar flexion intact Incision: dressing C/D/I No cellulitis present Dressing/Incision - clean, dry, no drainage Motor Function - intact, moving foot and toes well on exam. Dorsiflexion and plantarflexion intact to the right foot.  Able to perform straight leg raise with minimal assistance.  Past Medical History:  Diagnosis Date  . Anxiety   . Arthritis    Osteoarthritis  . Breast cancer ([place]) 2009   right breast lumpectomy with  rad tx  . Colon cancer ([place])    surgery with chemo and rad tx  . Complication of anesthesia   . GERD (gastroesophageal reflux disease)   . Heart palpitations   . History of hiatal hernia   . History of kidney stones   . Hypothyroidism   . Liver disease   . Liver nodule    s/p negative biopsy  . Malignant neoplasm of thyroid gland ([place]) 2002   s/p surgery and XRT  . Osteoporosis   . Other and unspecified hyperlipidemia   . Palpitations   . Personal history of chemotherapy   . Personal history of malignant neoplasm of large intestine    carcinoma - cecum, s/p right laparoscopic colectomy - s/p chemotherapy and XRT  . Personal history of radiation therapy   . Pneumonia 2019  . PONV (postoperative nausea and vomiting)   . Pure hypercholesterolemia   . Unspecified hereditary and idiopathic peripheral neuropathy     Assessment/Plan: 1 Day Post-Op Procedure(s) (LRB): TOTAL HIP ARTHROPLASTY (Right) Active Problems:   Status post total hip replacement, right  Estimated body mass index is 25.75 kg/m as calculated from the following:   Height as of this encounter: 5\' 4"  (1.626 m).   Weight as of this encounter: 68 kg. Advance diet Up with therapy D/C IV fluids when tolerating po intake.  Labs reviewed this AM. Patient lives on the 3rd floor and lives alone, plan is to go to rehab. Begin working on BM. Numbness has  resolved to the right leg. Continue with PT today.  DVT Prophylaxis - Lovenox, Foot Pumps and TED hose Weight-Bearing as tolerated to right leg  J. Cameron Proud, PA-C [facility] 10/25/2019, 7:52 AM

## 2019-10-25 NOTE — TOC Progression Note (Signed)
Transition of Care [facility]) - Progression Note    Patient Details  Name: Debra Hale MRN: [id number] Date of Birth: [birth year]  Transition of Care [facility]) CM/SW [place], RN Phone Number: 10/25/2019, 3:43 PM  Clinical Narrative:    Met with the patient to review bed choices she chose bed offer from [place] notified [place], I called [facility] Oncall and spoke with [place] to request ins auth to start tomorrow, Also requested Ins approval for [facility] to transport   Expected Discharge Plan: [place] Barriers to Discharge: Continued Medical Work up  Expected Discharge Plan and Services Expected Discharge Plan: [place]   Discharge Planning Services: CM Consult   Living arrangements for the past 2 months: [place]                 DME Arranged: N/A         HH Arranged: NA           Social Determinants of Health (SDOH) Interventions    Readmission Risk Interventions No flowsheet data found.

## 2019-10-25 NOTE — TOC Initial Note (Signed)
Transition of Care [facility]) - Initial/Assessment Note    Patient Details  Name: Debra Hale MRN: [id number] Date of Birth: May 02, 1951  Transition of Care [facility]) CM/SW Contact:    Su Hilt, RN Phone Number: 10/25/2019, 8:43 AM  Clinical Narrative:                 Met with the patient to discuss DC plan and needs I explained that I will need to see what is recommended by PT for her insurance to even consider approving SNF, She stated she lives at home alone and has noone to help her, I encouraged her to work with PT this morning and in the mean time I will do a bed search to see which facilities has a bed, she has not had the covid vaccines  Expected Discharge Plan: Skilled Nursing Facility Barriers to Discharge: Continued Medical Work up   Patient Goals and CMS Choice Patient states their goals for this hospitalization and ongoing recovery are:: go to rehab before home      Expected Discharge Plan and Services Expected Discharge Plan: [place]   Discharge Planning Services: CM Consult   Living arrangements for the past 2 months: [place]                 DME Arranged: N/A         HH Arranged: NA          Prior Living Arrangements/Services Living arrangements for the past 2 months: [place] Lives with:: Self Patient language and need for interpreter reviewed:: Yes Do you feel safe going back to the place where you live?: Yes      Need for Family Participation in Patient Care: No (Comment) Care giver support system in place?: Yes (comment)   Criminal Activity/Legal Involvement Pertinent to Current Situation/Hospitalization: No - Comment as needed  Activities of Daily Living Home Assistive Devices/Equipment: None ADL Screening (condition at time of admission) Patient's cognitive ability adequate to safely complete daily activities?: Yes Is the patient deaf or have difficulty hearing?: No Does the patient have  difficulty seeing, even when wearing glasses/contacts?: No Does the patient have difficulty concentrating, remembering, or making decisions?: No Patient able to express need for assistance with ADLs?: Yes Does the patient have difficulty dressing or bathing?: No Independently performs ADLs?: Yes (appropriate for developmental age) Does the patient have difficulty walking or climbing stairs?: Yes Weakness of Legs: Right Weakness of Arms/Hands: None  Permission Sought/Granted   Permission granted to share information with : Yes, Verbal Permission Granted              Emotional Assessment Appearance:: Appears stated age Attitude/Demeanor/Rapport: Engaged Affect (typically observed): Appropriate Orientation: : Oriented to Self, Oriented to Place, Oriented to  Time, Oriented to Situation Alcohol / Substance Use: Not Applicable Psych Involvement: No (comment)  Admission diagnosis:  Status post total hip replacement, right [O70.962] Patient Active Problem List   Diagnosis Date Noted  . 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 Rectal bleed 06/17/2019  . Pleural effusion 12/03/2018  . Right hip pain 12/03/2018  . Leukopenia 08/21/2018  . Change in vision 06/24/2018  . Hypothyroidism 05/02/2018  . Neck pain 05/02/2018  . Nonintractable headache 04/18/2018  . Thrush 04/18/2018  . Pleural effusion associated with pulmonary infection 04/18/2018  . Other fatigue 04/18/2018  . Abdominal pain 03/06/2016  . Acute diarrhea 01/10/2016  . Neck nodule 10/13/2015  . Vitamin D deficiency 07/27/2015  .  Loose stools 04/14/2015  . Mild depression ([place]) 02/02/2015  . Health care maintenance 06/17/2014  . Stress 01/28/2014  . Acute pericarditis 05/31/2013  . Environmental allergies 02/25/2013  . Left elbow pain 02/25/2013  . Abnormal liver function test 12/07/2012  . Anxiety 05/06/2012  . GERD (gastroesophageal reflux disease) 05/06/2012  . Osteoporosis 05/06/2012  . History  of breast cancer 09/18/2008  . History of thyroid cancer 09/18/2008  . Hypercholesterolemia 09/18/2008  . Peripheral neuropathy 09/18/2008  . Palpitations 09/18/2008  . History of malignant neoplasm of large intestine 09/18/2008   PCP:  [name], MD Pharmacy:   [id number] [place] [address]Phone: [phone]: [phone] [id number] [place] [place]: [phone]: [phone]   Social Determinants of Health (SDOH) Interventions    Readmission Risk Interventions No flowsheet data found.

## 2019-10-25 NOTE — Progress Notes (Signed)
Physical Therapy Treatment Patient Details Name: Debra Hale MRN: [id number] DOB: Feb 09, 1951 Today's Date: 10/25/2019    History of Present Illness Pt is a 69 yo female s/p R THA, posterior approach, WBAT. 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    PT Comments    Pt easily woken in recliner. Reported some pain, 5/10 of R hip, eager to return to bed. The patient was able to perform transfers with CGA and RW, and ambulated ~64ft in room. Pt able to perform step through gait pattern, decreased gait velocity and step length/stride length noted. PT reported nausea, and requested emesis bag, further mobility deferred. RN notified of pt request for pain medication and nausea.The patient would benefit from further skilled PT intervention to continue to progress towards goals. Recommendation remains appropriate.       Follow Up Recommendations  SNF     Equipment Recommendations  Rolling walker with 5" wheels;3in1 (PT)    Recommendations for Other Services OT consult     Precautions / Restrictions Precautions Precautions: Posterior Hip;Fall Precaution Booklet Issued: No Restrictions Weight Bearing Restrictions: Yes RLE Weight Bearing: Weight bearing as tolerated    Mobility  Bed Mobility Overal bed mobility: Needs Assistance Bed Mobility: Sit to Supine     Supine to sit: Min guard;HOB elevated Sit to supine: Supervision;HOB elevated      Transfers Overall transfer level: Needs assistance Equipment used: Rolling walker (2 wheeled) Transfers: Sit to/from Stand Sit to Stand: Min guard;From elevated surface            Ambulation/Gait Ambulation/Gait assistance: Min guard Gait Distance (Feet): 30 Feet Assistive device: Rolling walker (2 wheeled)   Gait velocity: decreased   General Gait Details: pt able to  progress to step through gait pattern though step length and stride length decreased. Pt developed nausea, further mobility deferred.   Stairs             Wheelchair Mobility    Modified Rankin (Stroke Patients Only)       Balance Overall balance assessment: Needs assistance Sitting-balance support: Feet supported Sitting balance-Leahy Scale: Fair       Standing balance-Leahy Scale: Fair                              Cognition Arousal/Alertness: Awake/alert Behavior During Therapy: WFL for tasks assessed/performed Overall Cognitive Status: Within Functional Limits for tasks assessed                                        Exercises Total Joint Exercises Ankle Circles/Pumps: AROM;Both;10 reps Quad Sets: AROM;Right;Strengthening;10 reps Heel Slides: AAROM;Strengthening;Right;10 reps Hip ABduction/ADduction: AAROM;Strengthening;Right;10 reps    General Comments        Pertinent Vitals/Pain Pain Assessment: 0-10 Pain Score: 4  Pain Location: R hip Pain Descriptors / Indicators: Aching;Sore Pain Intervention(s): Limited activity within patient's tolerance;Monitored during session;Premedicated before session;Repositioned    Home Living Family/patient expects to be discharged to:: Private residence Living Arrangements: Alone Available Help at Discharge: Friend(s);Available PRN/intermittently Type of Home: House Home Access: Stairs to enter Entrance Stairs-Rails: Left Home Layout: One level Home Equipment: None      Prior Function Level of Independence: Independent  PT Goals (current goals can now be found in the care plan section) Acute Rehab PT Goals Patient Stated Goal: to go home PT Goal Formulation: With patient Time For Goal Achievement: 11/08/19 Potential to Achieve Goals: Good Progress towards PT goals: Progressing toward goals    Frequency    BID      PT Plan Current plan remains appropriate     Co-evaluation              AM-PAC PT "6 Clicks" Mobility   Outcome Measure  Help needed turning from your back to your side while in a flat bed without using bedrails?: A Little Help needed moving from lying on your back to sitting on the side of a flat bed without using bedrails?: A Little Help needed moving to and from a bed to a chair (including a wheelchair)?: A Little Help needed standing up from a chair using your arms (e.g., wheelchair or bedside chair)?: A Little Help needed to walk in hospital room?: A Lot Help needed climbing 3-5 steps with a railing? : A Lot 6 Click Score: 13    End of Session Equipment Utilized During Treatment: Gait belt Activity Tolerance: Patient tolerated treatment well;Other (comment) (limited by nausea) Patient left: with bed alarm set;in bed;with SCD's reapplied;with call bell/phone within reach Nurse Communication: Mobility status PT Visit Diagnosis: Other abnormalities of gait and mobility (R26.89);Muscle weakness (generalized) (M62.81);Pain;Difficulty in walking, not elsewhere classified (R26.2) Pain - Right/Left: Right Pain - part of body: Hip     Time: 2841-3244 PT Time Calculation (min) (ACUTE ONLY): 19 min  Charges:  $Therapeutic Exercise: 8-22 mins                    Lieutenant Diego PT, DPT 4:37 PM,10/25/19

## 2019-10-25 NOTE — NC FL2 (Signed)
[place] LEVEL OF CARE SCREENING TOOL     IDENTIFICATION  Patient Name: Debra Hale Birthdate: 06-27-1950 Sex: female Admission Date (Current Location): 10/24/2019  [place] and [place] Number:  Engineering geologist and Address:  [facility], [address], [place], [place] [address]      Provider Number: [id number]  Attending Physician Name and Address:  Corky Mull, MD  Relative Name and Phone Number:  Derrill Kay significant other [phone]    Current Level of Care: Hospital Recommended Level of Care: [place] Prior Approval Number:    Date Approved/Denied:   PASRR Number: [id number]  Discharge Plan: SNF    Current Diagnoses: Patient Active Problem List   Diagnosis Date Noted  . 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 Rectal bleed 06/17/2019  . Pleural effusion 12/03/2018  . Right hip pain 12/03/2018  . Leukopenia 08/21/2018  . Change in vision 06/24/2018  . Hypothyroidism 05/02/2018  . Neck pain 05/02/2018  . Nonintractable headache 04/18/2018  . Thrush 04/18/2018  . Pleural effusion associated with pulmonary infection 04/18/2018  . Other fatigue 04/18/2018  . Abdominal pain 03/06/2016  . Acute diarrhea 01/10/2016  . Neck nodule 10/13/2015  . Vitamin D deficiency 07/27/2015  . Loose stools 04/14/2015  . Mild depression ([place]) 02/02/2015  . Health care maintenance 06/17/2014  . Stress 01/28/2014  . Acute pericarditis 05/31/2013  . Environmental allergies 02/25/2013  . Left elbow pain 02/25/2013  . Abnormal liver function test 12/07/2012  . Anxiety 05/06/2012  . GERD (gastroesophageal reflux disease) 05/06/2012  . Osteoporosis 05/06/2012  . History of breast cancer 09/18/2008  . History of thyroid cancer 09/18/2008  . Hypercholesterolemia 09/18/2008  . Peripheral neuropathy 09/18/2008  . Palpitations 09/18/2008  . History of malignant neoplasm of large intestine 09/18/2008     Orientation RESPIRATION BLADDER Height & Weight     Self, Time, Situation, Place  Normal External catheter Weight: 68 kg Height:  5\' 4"  (162.6 cm)  BEHAVIORAL SYMPTOMS/MOOD NEUROLOGICAL BOWEL NUTRITION STATUS      Continent Diet (carb modified heart healthy)  AMBULATORY STATUS COMMUNICATION OF NEEDS Skin   Extensive Assist Verbally Surgical wounds                       Personal Care Assistance Level of Assistance  Bathing, Dressing Bathing Assistance: Limited assistance   Dressing Assistance: Limited assistance     Functional Limitations Info             SPECIAL CARE FACTORS FREQUENCY  PT (By licensed PT)     PT Frequency: 5 times per week              Contractures Contractures Info: Not present    Additional Factors Info  Code Status, Allergies Code Status Info: full code Allergies Info: Demeclocycline, Tetracyclines & Related, Augmentin Amoxicillin-pot Clavulanate, Bentyl Dicyclomine Hcl, Ciprofloxacin, Codeine, Epinephrine, Flagyl Metronidazole, Librax Chlordiazepoxide-clidinium, Phenobarbital, Prednisone, Ultram Tramadol           Current Medications (10/25/2019):  This is the current hospital active medication list Current Facility-Administered Medications  Medication Dose Route Frequency Provider Last Rate Last Admin  . 0.9 %  sodium chloride infusion   Intravenous Continuous Poggi, Marshall Cork, MD 75 mL/hr at 10/25/19 0543 New Bag at 10/25/19 0543  . acetaminophen (TYLENOL) tablet 1,000 mg  1,000 mg Oral Q6H Poggi, Marshall Cork, MD   1,000 mg at 10/25/19 0435  . acetaminophen (TYLENOL)  tablet 325-650 mg  325-650 mg Oral Q6H PRN Poggi, Marshall Cork, MD      . bisacodyl (DULCOLAX) suppository 10 mg  10 mg Rectal Daily PRN Poggi, Marshall Cork, MD      . clonazePAM [place]) tablet 1.5 mg  1.5 mg Oral BID Poggi, Marshall Cork, MD   1.5 mg at 10/24/19 2245  . colestipol (COLESTID) tablet 1 g  1 g Oral BH-q7a Poggi, Marshall Cork, MD      . diphenhydrAMINE (BENADRYL) 12.5 MG/5ML elixir  12.5-25 mg  12.5-25 mg Oral Q4H PRN Poggi, Marshall Cork, MD      . docusate sodium (COLACE) capsule 100 mg  100 mg Oral BID Corky Mull, MD   100 mg at 10/24/19 2058  . enoxaparin (LOVENOX) injection 40 mg  40 mg Subcutaneous Q24H Poggi, Marshall Cork, MD      . escitalopram (LEXAPRO) tablet 20 mg  20 mg Oral BH-q7a Poggi, Marshall Cork, MD      . HYDROmorphone (DILAUDID) injection 0.25-0.5 mg  0.25-0.5 mg Intravenous Q2H PRN Poggi, Marshall Cork, MD      . ketorolac (TORADOL) 15 MG/ML injection 7.5 mg  7.5 mg Intravenous Q6H Poggi, Marshall Cork, MD   7.5 mg at 10/25/19 0435  . levothyroxine (SYNTHROID) tablet 112 mcg  112 mcg Oral QAC breakfast Corky Mull, MD   112 mcg at 10/25/19 0435  . magnesium hydroxide (MILK OF MAGNESIA) suspension 30 mL  30 mL Oral Daily PRN Poggi, Marshall Cork, MD   30 mL at 10/24/19 1651  . magnesium oxide (MAG-OX) tablet 400 mg  400 mg Oral BH-q7a Poggi, Marshall Cork, MD      . metoCLOPramide (REGLAN) tablet 5-10 mg  5-10 mg Oral Q8H PRN Poggi, Marshall Cork, MD       Or  . metoCLOPramide (REGLAN) injection 5-10 mg  5-10 mg Intravenous Q8H PRN Poggi, Marshall Cork, MD      . ondansetron (ZOFRAN) tablet 4 mg  4 mg Oral Q6H PRN Poggi, Marshall Cork, MD       Or  . ondansetron (ZOFRAN) injection 4 mg  4 mg Intravenous Q6H PRN Poggi, Marshall Cork, MD      . oxyCODONE (Oxy IR/ROXICODONE) immediate release tablet 5-10 mg  5-10 mg Oral Q4H PRN Poggi, Marshall Cork, MD   10 mg at 10/24/19 2058  . pantoprazole (PROTONIX) EC tablet 40 mg  40 mg Oral BH-q7a Poggi, Marshall Cork, MD      . potassium chloride (KLOR-CON) CR tablet 10 mEq  10 mEq Oral Daily Poggi, Marshall Cork, MD   10 mEq at 10/24/19 1727  . sodium phosphate (FLEET) 7-19 GM/118ML enema 1 enema  1 enema Rectal Once PRN Poggi, Marshall Cork, MD         Discharge Medications: Please see discharge summary for a list of discharge medications.  Relevant Imaging Results:  Relevant Lab Results:   Additional Information SS# [id number]  Su Hilt, RN

## 2019-10-26 ENCOUNTER — Other Ambulatory Visit: Payer: PPO

## 2019-10-26 DIAGNOSIS — Z85850 Personal history of malignant neoplasm of thyroid: Secondary | ICD-10-CM | POA: Diagnosis not present

## 2019-10-26 DIAGNOSIS — F419 Anxiety disorder, unspecified: Secondary | ICD-10-CM | POA: Diagnosis not present

## 2019-10-26 DIAGNOSIS — G629 Polyneuropathy, unspecified: Secondary | ICD-10-CM | POA: Diagnosis not present

## 2019-10-26 DIAGNOSIS — F320 Major depressive disorder, single episode, mild: Secondary | ICD-10-CM | POA: Diagnosis not present

## 2019-10-26 DIAGNOSIS — R142 Eructation: Secondary | ICD-10-CM | POA: Diagnosis not present

## 2019-10-26 DIAGNOSIS — N189 Chronic kidney disease, unspecified: Secondary | ICD-10-CM | POA: Diagnosis not present

## 2019-10-26 DIAGNOSIS — E039 Hypothyroidism, unspecified: Secondary | ICD-10-CM | POA: Diagnosis not present

## 2019-10-26 DIAGNOSIS — D72819 Decreased white blood cell count, unspecified: Secondary | ICD-10-CM | POA: Diagnosis not present

## 2019-10-26 DIAGNOSIS — M2550 Pain in unspecified joint: Secondary | ICD-10-CM | POA: Diagnosis not present

## 2019-10-26 DIAGNOSIS — E559 Vitamin D deficiency, unspecified: Secondary | ICD-10-CM | POA: Diagnosis not present

## 2019-10-26 DIAGNOSIS — Z7401 Bed confinement status: Secondary | ICD-10-CM | POA: Diagnosis not present

## 2019-10-26 DIAGNOSIS — Z471 Aftercare following joint replacement surgery: Secondary | ICD-10-CM | POA: Diagnosis not present

## 2019-10-26 DIAGNOSIS — R143 Flatulence: Secondary | ICD-10-CM | POA: Diagnosis not present

## 2019-10-26 DIAGNOSIS — Z853 Personal history of malignant neoplasm of breast: Secondary | ICD-10-CM | POA: Diagnosis not present

## 2019-10-26 DIAGNOSIS — E7800 Pure hypercholesterolemia, unspecified: Secondary | ICD-10-CM | POA: Diagnosis not present

## 2019-10-26 DIAGNOSIS — K760 Fatty (change of) liver, not elsewhere classified: Secondary | ICD-10-CM | POA: Diagnosis not present

## 2019-10-26 DIAGNOSIS — R1312 Dysphagia, oropharyngeal phase: Secondary | ICD-10-CM | POA: Diagnosis not present

## 2019-10-26 DIAGNOSIS — K219 Gastro-esophageal reflux disease without esophagitis: Secondary | ICD-10-CM | POA: Diagnosis not present

## 2019-10-26 DIAGNOSIS — M810 Age-related osteoporosis without current pathological fracture: Secondary | ICD-10-CM | POA: Diagnosis not present

## 2019-10-26 DIAGNOSIS — F329 Major depressive disorder, single episode, unspecified: Secondary | ICD-10-CM | POA: Diagnosis not present

## 2019-10-26 DIAGNOSIS — Z85038 Personal history of other malignant neoplasm of large intestine: Secondary | ICD-10-CM | POA: Diagnosis not present

## 2019-10-26 DIAGNOSIS — K580 Irritable bowel syndrome with diarrhea: Secondary | ICD-10-CM | POA: Diagnosis not present

## 2019-10-26 DIAGNOSIS — M47816 Spondylosis without myelopathy or radiculopathy, lumbar region: Secondary | ICD-10-CM | POA: Diagnosis not present

## 2019-10-26 DIAGNOSIS — M1611 Unilateral primary osteoarthritis, right hip: Secondary | ICD-10-CM | POA: Diagnosis not present

## 2019-10-26 DIAGNOSIS — Z96641 Presence of right artificial hip joint: Secondary | ICD-10-CM | POA: Diagnosis not present

## 2019-10-26 DIAGNOSIS — R141 Gas pain: Secondary | ICD-10-CM | POA: Diagnosis not present

## 2019-10-26 LAB — SURGICAL PATHOLOGY

## 2019-10-26 LAB — SARS CORONAVIRUS 2 BY RT PCR (HOSPITAL ORDER, PERFORMED IN ~~LOC~~ HOSPITAL LAB): SARS Coronavirus 2: NEGATIVE

## 2019-10-26 LAB — CBC
HCT: 29.4 % — ABNORMAL LOW (ref 36.0–46.0)
Hemoglobin: 10 g/dL — ABNORMAL LOW (ref 12.0–15.0)
MCH: 28.9 pg (ref 26.0–34.0)
MCHC: 34 g/dL (ref 30.0–36.0)
MCV: 85 fL (ref 80.0–100.0)
Platelets: 114 10*3/uL — ABNORMAL LOW (ref 150–400)
RBC: 3.46 MIL/uL — ABNORMAL LOW (ref 3.87–5.11)
RDW: 14.1 % (ref 11.5–15.5)
WBC: 3.1 10*3/uL — ABNORMAL LOW (ref 4.0–10.5)
nRBC: 0 % (ref 0.0–0.2)

## 2019-10-26 LAB — BASIC METABOLIC PANEL
Anion gap: 6 (ref 5–15)
BUN: 17 mg/dL (ref 8–23)
CO2: 29 mmol/L (ref 22–32)
Calcium: 7.7 mg/dL — ABNORMAL LOW (ref 8.9–10.3)
Chloride: 102 mmol/L (ref 98–111)
Creatinine, Ser: 1.18 mg/dL — ABNORMAL HIGH (ref 0.44–1.00)
GFR calc Af Amer: 54 mL/min — ABNORMAL LOW (ref >60)
GFR calc non Af Amer: 47 mL/min — ABNORMAL LOW (ref >60)
Glucose, Bld: 147 mg/dL — ABNORMAL HIGH (ref 70–99)
Potassium: 4.2 mmol/L (ref 3.5–5.1)
Sodium: 137 mmol/L (ref 135–145)

## 2019-10-26 MED ORDER — ENOXAPARIN SODIUM 40 MG/0.4ML ~~LOC~~ SOLN: 40.0000 mg | SUBCUTANEOUS | 0 refills | Status: DC

## 2019-10-26 MED ORDER — OXYCODONE HCL 5 MG PO TABS: 5.0000 mg | ORAL_TABLET | ORAL | 0 refills | Status: DC | PRN

## 2019-10-26 NOTE — TOC Progression Note (Signed)
Transition of Care [facility]) - Progression Note    Patient Details  Name: Debra Hale MRN: [id number] Date of Birth: [birth year]  Transition of Care [facility]) CM/SW [place], RN Phone Number: 10/26/2019, 1:39 PM  Clinical Narrative:     Bedside nurse called report to facility RNCM called [facility] for transport, there are 5 on the list  Nurse was notified  Expected Discharge Plan: [place] Barriers to Discharge: Continued Medical Work up  Expected Discharge Plan and Services Expected Discharge Plan: [place]   Discharge Planning Services: CM Consult   Living arrangements for the past 2 months: [place] Expected Discharge Date: 10/26/19               DME Arranged: N/A         HH Arranged: NA           Social Determinants of Health (SDOH) Interventions    Readmission Risk Interventions No flowsheet data found.

## 2019-10-26 NOTE — Progress Notes (Signed)
Physical Therapy Treatment Patient Details Name: Debra Hale MRN: [id number] DOB: 09-08-1950 Today's Date: 10/26/2019    History of Present Illness Pt is a 69 yo female s/p R THA, posterior approach, WBAT. 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    PT Comments    Pt very sleepy/groggy during session, difficulty keeping eyes open. Did endorse LE pain at end of session, RN notified. The patient was able to perform exercises with physical assist and cueing to keep eyes open, supine to sit with HOB and modA. Sit <> stand with RW and ModA as well. Pt able to take a few shuffled steps towards the recliner, MinA to maintain balance and modA for RW management and to assist with sequencing the task. Pt up in chair, all needs in reach. The patient would benefit from further skilled PT intervention to continue to progress towards goals. Recommendation remains appropriate.     Follow Up Recommendations  SNF     Equipment Recommendations  Rolling walker with 5" wheels;3in1 (PT)    Recommendations for Other Services OT consult     Precautions / Restrictions Precautions Precautions: Posterior Hip;Fall Precaution Booklet Issued: No Restrictions Weight Bearing Restrictions: No RLE Weight Bearing: Weight bearing as tolerated    Mobility  Bed Mobility Overal bed mobility: Needs Assistance Bed Mobility: Sit to Supine     Supine to sit: Mod assist;HOB elevated     General bed mobility comments: pt much more lethargic this AM, cued to stay awake throughout, stated she did not get much rest  Transfers Overall transfer level: Needs assistance Equipment used: Rolling walker (2 wheeled) Transfers: Sit to/from Stand Sit to Stand: Mod assist         General transfer comment: modA to stand and to maintain RW safety, minA to maintain  balance. Pt very groggy  Ambulation/Gait             General Gait Details: held due to pt sleepiness   Stairs             Wheelchair Mobility    Modified Rankin (Stroke Patients Only)       Balance Overall balance assessment: Needs assistance Sitting-balance support: Feet supported Sitting balance-Leahy Scale: Poor Sitting balance - Comments: reliant on UE support this AM due to sleepiness     Standing balance-Leahy Scale: Poor Standing balance comment: grogginess limited pt functional status                            Cognition Arousal/Alertness: Awake/alert Behavior During Therapy: WFL for tasks assessed/performed Overall Cognitive Status: Within Functional Limits for tasks assessed                                        Exercises Total Joint Exercises Ankle Circles/Pumps: AROM;Both;10 reps Quad Sets: AROM;Right;Strengthening;10 reps Short Arc Quad: AROM;Strengthening;Right;10 reps Heel Slides: AAROM;Strengthening;Right;10 reps Hip ABduction/ADduction: AAROM;Strengthening;Right;10 reps    General Comments        Pertinent Vitals/Pain Pain Assessment: 0-10 Pain Score: 5  Pain Location: R hip Pain Descriptors / Indicators: Aching;Sore Pain Intervention(s): Limited activity within patient's tolerance;Monitored during session;Premedicated before session;Repositioned    Home Living  Prior Function            PT Goals (current goals can now be found in the care plan section) Progress towards PT goals: Progressing toward goals    Frequency    BID      PT Plan Current plan remains appropriate    Co-evaluation              AM-PAC PT "6 Clicks" Mobility   Outcome Measure  Help needed turning from your back to your side while in a flat bed without using bedrails?: A Lot Help needed moving from lying on your back to sitting on the side of a flat bed without using bedrails?: A  Lot Help needed moving to and from a bed to a chair (including a wheelchair)?: A Lot Help needed standing up from a chair using your arms (e.g., wheelchair or bedside chair)?: A Lot Help needed to walk in hospital room?: A Lot Help needed climbing 3-5 steps with a railing? : Total 6 Click Score: 11    End of Session Equipment Utilized During Treatment: Gait belt Activity Tolerance: Patient tolerated treatment well;Other (comment) (limited by grogginess) Patient left: with bed alarm set;in bed;with SCD's reapplied;with call bell/phone within reach Nurse Communication: Mobility status PT Visit Diagnosis: Other abnormalities of gait and mobility (R26.89);Muscle weakness (generalized) (M62.81);Pain;Difficulty in walking, not elsewhere classified (R26.2) Pain - Right/Left: Right Pain - part of body: Hip     Time: 4665-9935 PT Time Calculation (min) (ACUTE ONLY): 24 min  Charges:  $Therapeutic Exercise: 23-37 mins                    Lieutenant Diego PT, DPT 11:36 AM,10/26/19

## 2019-10-26 NOTE — Progress Notes (Addendum)
Subjective: 2 Days Post-Op Procedure(s) (LRB): TOTAL HIP ARTHROPLASTY (Right) Patient reports pain as 4 on 0-10 scale.  Did have increased pain last night. Patient is well, and has had no acute complaints or problems Plan is to go Skilled nursing facility after hospital stay. Negative for chest pain and shortness of breath Fever: no Gastrointestinal:Negative for nausea and vomiting  Objective: Vital signs in last 24 hours: Temp:  [97.8 F (36.6 C)-101.2 F (38.4 C)] 98.5 F (36.9 C) (06/24 0726) Pulse Rate:  [76-98] 94 (06/24 0726) Resp:  [16-18] 18 (06/24 0726) BP: (115-150)/(60-81) 150/81 (06/24 0726) SpO2:  [91 %-98 %] 92 % (06/24 0726)  Intake/Output from previous day:  Intake/Output Summary (Last 24 hours) at 10/26/2019 0737 Last data filed at 10/26/2019 0315 Gross per 24 hour  Intake 240 ml  Output 0 ml  Net 240 ml    Intake/Output this shift: No intake/output data recorded.  Labs: No results for input(s): HGB in the last 72 hours. No results for input(s): WBC, RBC, HCT, PLT in the last 72 hours. Recent Labs    10/25/19 0526 10/26/19 0529  NA 139 137  K 4.0 4.2  CL 105 102  CO2 28 29  BUN 21 17  CREATININE 1.21* 1.18*  GLUCOSE 135* 147*  CALCIUM 7.4* 7.7*   No results for input(s): LABPT, INR in the last 72 hours.   EXAM General - Patient is Alert, Appropriate and Oriented Extremity - ABD soft Sensation intact distally Intact pulses distally Dorsiflexion/Plantar flexion intact Incision: dressing C/D/I No cellulitis present Dressing/Incision - clean, dry, no drainage Motor Function - intact, moving foot and toes well on exam. Dorsiflexion and plantarflexion intact to the right foot.  Able to perform straight leg raise with minimal assistance. Negative Homan's to bilateral lower extremities.  Past Medical History:  Diagnosis Date  . Anxiety   . Arthritis    Osteoarthritis  . Breast cancer ([place]) 2009   right breast lumpectomy with rad tx  .  Colon cancer ([place])    surgery with chemo and rad tx  . Complication of anesthesia   . GERD (gastroesophageal reflux disease)   . Heart palpitations   . History of hiatal hernia   . History of kidney stones   . Hypothyroidism   . Liver disease   . Liver nodule    s/p negative biopsy  . Malignant neoplasm of thyroid gland ([place]) 2002   s/p surgery and XRT  . Osteoporosis   . Other and unspecified hyperlipidemia   . Palpitations   . Personal history of chemotherapy   . Personal history of malignant neoplasm of large intestine    carcinoma - cecum, s/p right laparoscopic colectomy - s/p chemotherapy and XRT  . Personal history of radiation therapy   . Pneumonia 2019  . PONV (postoperative nausea and vomiting)   . Pure hypercholesterolemia   . Unspecified hereditary and idiopathic peripheral neuropathy     Assessment/Plan: 2 Days Post-Op Procedure(s) (LRB): TOTAL HIP ARTHROPLASTY (Right) Active Problems:   Status post total hip replacement, right  Estimated body mass index is 25.75 kg/m as calculated from the following:   Height as of this encounter: 5\' 4"  (1.626 m).   Weight as of this encounter: 68 kg. Advance diet Up with therapy D/C IV fluids when tolerating po intake.  Labs reviewed this AM.  CBC ordered. Fever 101 last night, denies any SOB, urinary symptoms or abdominal pain. Encourage incentive spirometer. Patient lives on the 3rd floor and lives  alone, plan is to go to rehab. Begin working on BM. Continue with PT today.  DVT Prophylaxis - Lovenox, Foot Pumps and TED hose Weight-Bearing as tolerated to right leg  J. Cameron Proud, PA-C [facility] 10/26/2019, 7:37 AM

## 2019-10-26 NOTE — Progress Notes (Signed)
Pt d/c to [facility] this afternoon via EMS.  All belongings taken at time of d/c.  NAD noted.  IV removed from pt L FA without complications.  This nurse attempted to call report to nurse at [facility] but receiving nurse did not pick up.  Dressing to R hip clean dry and intact at time of d/c.

## 2019-10-26 NOTE — Progress Notes (Signed)
OT Cancellation Note  Patient Details Name: Debra Hale MRN: [id number] DOB: Jul 15, 1950   Cancelled Treatment:    Reason Eval/Treat Not Completed: Fatigue/lethargy limiting ability to participate. Order received and chart reviewed. Pt drowsy and reported feeling tired and "foggy" this morning upon evaluation attempt due to poor night's sleep and medication. Eyes closing throughout conversation. Plan to re-attempt later this date as pt available and appropriate.  Jerilynn Birkenhead, OTS 10/26/19, 9:24 AM

## 2019-10-26 NOTE — Discharge Instructions (Signed)
Instructions after Total Hip Replacement     J. Jeffrey Poggi, M.D.  J. Lance , PA-C     Dept. of Orthopaedics & Sports Medicine  [facility]  [address]  [place], [place]  [address]  Phone: [phone]   Fax: [phone]    DIET: . Drink plenty of non-alcoholic fluids. . Resume your normal diet. Include foods high in fiber.  ACTIVITY:  . You may use crutches or a walker with weight-bearing as tolerated, unless instructed otherwise. . You may be weaned off of the walker or crutches by your Physical Therapist.  . Do NOT reach below the level of your knees or cross your legs until allowed.    . Continue doing gentle exercises. Exercising will reduce the pain and swelling, increase motion, and prevent muscle weakness.   . Please continue to use the TED compression stockings for 6 weeks. You may remove the stockings at night, but should reapply them in the morning. . Do not drive or operate any equipment until instructed.  WOUND CARE:  . Continue to use ice packs periodically to reduce pain and swelling. . Keep the incision clean and dry. . You may bathe or shower after the staples are removed at the first office visit following surgery.  MEDICATIONS: . You may resume your regular medications. . Please take the pain medication as prescribed on the medication. . Do not take pain medication on an empty stomach. . You have been given a prescription for a blood thinner to prevent blood clots. Please take the medication as instructed. (NOTE: After completing a 2 week course of Lovenox, take one Enteric-coated aspirin once a day.) . Pain medications and iron supplements can cause constipation. Use a stool softener (Senokot or Colace) on a daily basis and a laxative (dulcolax or miralax) as needed. . Do not drive or drink alcoholic beverages when taking pain medications.  CALL THE OFFICE FOR: . Temperature above 101 degrees . Excessive bleeding or drainage on the  dressing. . Excessive swelling, coldness, or paleness of the toes. . Persistent nausea and vomiting.  FOLLOW-UP:  . You should have an appointment to return to the office in 2 weeks after surgery. . Arrangements have been made for continuation of Physical Therapy (either home therapy or outpatient therapy).  

## 2019-10-26 NOTE — TOC Progression Note (Addendum)
Transition of Care [facility]) - Progression Note    Patient Details  Name: Debra Hale MRN: [id number] Date of Birth: 09-Nov-1950  Transition of Care [facility]) CM/SW [place], RN Phone Number: 10/26/2019, 11:04 AM  Clinical Narrative:    Received a call from [employer] insurance with approval for SNF to go to [place] was approved [id number], [facility] was approved [phone] The patient needs a covid test , notified the patient and the physician of the approval She has not had vaccines for COvid  Expected Discharge Plan: [place] Barriers to Discharge: Continued Medical Work up  Expected Discharge Plan and Services Expected Discharge Plan: [place]   Discharge Planning Services: CM Consult   Living arrangements for the past 2 months: [place]                 DME Arranged: N/A         HH Arranged: NA           Social Determinants of Health (SDOH) Interventions    Readmission Risk Interventions No flowsheet data found.

## 2019-10-26 NOTE — Discharge Summary (Signed)
Physician Discharge Summary  Patient ID: KATILYNN SINKLER MRN: [id number] DOB/AGE: [birth year] 70 y.o.  Admit date: 10/24/2019 Discharge date: 10/26/2019  Admission Diagnoses:  Status post total hip replacement, right [Z96.641]  Discharge Diagnoses: Patient Active Problem List   Diagnosis Date Noted  . 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 Rectal bleed 06/17/2019  . Pleural effusion 12/03/2018  . Right hip pain 12/03/2018  . Leukopenia 08/21/2018  . Change in vision 06/24/2018  . Hypothyroidism 05/02/2018  . Neck pain 05/02/2018  . Nonintractable headache 04/18/2018  . Thrush 04/18/2018  . Pleural effusion associated with pulmonary infection 04/18/2018  . Other fatigue 04/18/2018  . Abdominal pain 03/06/2016  . Acute diarrhea 01/10/2016  . Neck nodule 10/13/2015  . Vitamin D deficiency 07/27/2015  . Loose stools 04/14/2015  . Mild depression ([place]) 02/02/2015  . Health care maintenance 06/17/2014  . Stress 01/28/2014  . Acute pericarditis 05/31/2013  . Environmental allergies 02/25/2013  . Left elbow pain 02/25/2013  . Abnormal liver function test 12/07/2012  . Anxiety 05/06/2012  . GERD (gastroesophageal reflux disease) 05/06/2012  . Osteoporosis 05/06/2012  . History of breast cancer 09/18/2008  . History of thyroid cancer 09/18/2008  . Hypercholesterolemia 09/18/2008  . Peripheral neuropathy 09/18/2008  . Palpitations 09/18/2008  . History of malignant neoplasm of large intestine 09/18/2008    Past Medical History:  Diagnosis Date  . Anxiety   . Arthritis    Osteoarthritis  . Breast cancer ([place]) 2009   right breast lumpectomy with rad tx  . Colon cancer ([place])    surgery with chemo and rad tx  . Complication of anesthesia   . GERD (gastroesophageal reflux disease)   . Heart palpitations   . History of hiatal hernia   . History of kidney stones   . Hypothyroidism   . Liver disease   . Liver nodule    s/p negative biopsy  . Malignant  neoplasm of thyroid gland ([place]) 2002   s/p surgery and XRT  . Osteoporosis   . Other and unspecified hyperlipidemia   . Palpitations   . Personal history of chemotherapy   . Personal history of malignant neoplasm of large intestine    carcinoma - cecum, s/p right laparoscopic colectomy - s/p chemotherapy and XRT  . Personal history of radiation therapy   . Pneumonia 2019  . PONV (postoperative nausea and vomiting)   . Pure hypercholesterolemia   . Unspecified hereditary and idiopathic peripheral neuropathy      Transfusion: None.   Consultants (if any):   Discharged Condition: Improved  Hospital Course: EDGAR CORRIGAN is an 69 y.o. female who was admitted 10/24/2019 with a diagnosis of primary osteoarthritis of the right hip and went to the operating room on 10/24/2019 and underwent the above named procedures.    Surgeries: Procedure(s): TOTAL HIP ARTHROPLASTY on 10/24/2019 Patient tolerated the surgery well. Taken to PACU where she was stabilized and then transferred to the orthopedic floor.  Started on Lovenox 40mg  q 24 hrs. Foot pumps applied bilaterally at 80 mm. Heels elevated on bed with rolled towels. No evidence of DVT. Negative Homan. Physical therapy started on day #1 for gait training and transfer. OT started day #1 for ADL and assisted devices.  Patient's IV , foley and hemovac was d/c on day #2.  Implants: Biomet press-fit system with a #11 laterally offset Echo femoral stem, a 52 mm acetabular shell with an E-poly hi-wall liner, and a 36 mm chrome cobalt head with  a -6 mm neck.   She was given perioperative antibiotics:  Anti-infectives (From admission, onward)   Start     Dose/Rate Route Frequency Ordered Stop   10/24/19 1700  clindamycin (CLEOCIN) IVPB 600 mg        600 mg 100 mL/hr over 30 Minutes Intravenous Every 6 hours 10/24/19 1418 10/25/19 1025   10/24/19 0922  clindamycin (CLEOCIN) 900 MG/50ML IVPB       Note to Pharmacy: Sylvester Harder   : cabinet  override      10/24/19 0922 10/24/19 1057   10/24/19 0600  clindamycin (CLEOCIN) IVPB 900 mg        900 mg 100 mL/hr over 30 Minutes Intravenous On call to O.R. 10/24/19 0126 10/24/19 1048    .  She was given sequential compression devices, early ambulation, and Lovenox for DVT prophylaxis.  She benefited maximally from the hospital stay and there were no complications.    Recent vital signs:  Vitals:   10/26/19 0126 10/26/19 0726  BP:  (!) 150/81  Pulse:  94  Resp:  18  Temp: 99.6 F (37.6 C) 98.5 F (36.9 C)  SpO2:  92%    Recent laboratory studies:  Lab Results  Component Value Date   HGB 10.0 (L) 10/26/2019   HGB 14.2 10/18/2019   HGB 15.2 (H) 09/26/2019   Lab Results  Component Value Date   WBC 3.1 (L) 10/26/2019   PLT 114 (L) 10/26/2019   No results found for: INR Lab Results  Component Value Date   NA 137 10/26/2019   K 4.2 10/26/2019   CL 102 10/26/2019   CO2 29 10/26/2019   BUN 17 10/26/2019   CREATININE 1.18 (H) 10/26/2019   GLUCOSE 147 (H) 10/26/2019    Discharge Medications:   Allergies as of 10/26/2019      Reactions   Demeclocycline Other (See Comments)   Throat swells   Tetracyclines & Related Other (See Comments)   Throat swells   Augmentin [amoxicillin-pot Clavulanate] Diarrhea   Bentyl [dicyclomine Hcl]    unkn   Ciprofloxacin Diarrhea   Codeine Other (See Comments)   dizziness   Epinephrine    Speeds heart up - panic    Flagyl [metronidazole] Nausea And Vomiting   Librax [chlordiazepoxide-clidinium]    unkn   Phenobarbital    unkn   Prednisone    Nervous    Ultram [tramadol] Other (See Comments)   Sick feeling      Medication List    TAKE these medications   clonazePAM 0.5 MG tablet Commonly known as: KLONOPIN Take 1.5 mg by mouth 2 (two) times daily.   colestipol 1 g tablet Commonly known as: COLESTID Take 1 g by mouth every morning. TAKES FOR DIARRHEA   enoxaparin 40 MG/0.4ML injection Commonly known as:  LOVENOX Inject 0.4 mLs (40 mg total) into the skin daily.   ibuprofen 200 MG tablet Commonly known as: ADVIL Take 200 mg by mouth every 8 (eight) hours as needed for moderate pain.   ibuprofen 600 MG tablet Commonly known as: ADVIL Take 1 tablet (600 mg total) by mouth every 6 (six) hours as needed.   levothyroxine 112 MCG tablet Commonly known as: SYNTHROID Take 1 tablet (112 mcg total) by mouth daily. What changed: when to take this   Lexapro 20 MG tablet Generic drug: escitalopram Take 20 mg by mouth every morning.   magnesium oxide 400 MG tablet Commonly known as: MAG-OX TAKE 1 TABLET BY MOUTH EVERY  DAY What changed: when to take this   oxyCODONE 5 MG immediate release tablet Commonly known as: Oxy IR/ROXICODONE Take 1-2 tablets (5-10 mg total) by mouth every 4 (four) hours as needed for moderate pain.   pantoprazole 40 MG tablet Commonly known as: PROTONIX TAKE 1 TABLET BY MOUTH EVERY DAY What changed: when to take this   potassium chloride 10 MEQ tablet Commonly known as: KLOR-CON TAKE 1 TABLET BY MOUTH EVERY DAY      Diagnostic Studies: DG HIP UNILAT W OR W/O PELVIS 2-3 VIEWS RIGHT  Result Date: 10/24/2019 CLINICAL DATA:  Postoperative evaluation. EXAM: DG HIP (WITH OR WITHOUT PELVIS) 2-3V RIGHT COMPARISON:  December 27, 2018 FINDINGS: A total right hip replacement is seen. There is no evidence of surrounding lucency to suggest the presence of hardware loosening or infection. There is no evidence of hip fracture or dislocation. There is no evidence of arthropathy or other focal bone abnormality. A moderate amount of soft tissue air is seen along the lateral aspect of the right hip. IMPRESSION: Status post total right hip replacement without evidence of hardware complication or fracture. Electronically Signed   By: Virgina Norfolk M.D.   On: 10/24/2019 16:11   Disposition: Plan for discharge to SNF today.   Contact information for follow-up providers    Lattie Corns, PA-C Follow up in 14 day(s).   Specialty: Physician Assistant Why: Electa Sniff information: [place] [address] [phone]            Contact information for after-discharge care    [facility] .   Service: Skilled Nursing Contact information: [place] [phone]                   Signed: Judson Roch PA-C 10/26/2019, 11:17 AM

## 2019-10-27 DIAGNOSIS — R141 Gas pain: Secondary | ICD-10-CM | POA: Diagnosis not present

## 2019-10-27 DIAGNOSIS — R142 Eructation: Secondary | ICD-10-CM | POA: Diagnosis not present

## 2019-10-27 DIAGNOSIS — M1611 Unilateral primary osteoarthritis, right hip: Secondary | ICD-10-CM | POA: Diagnosis not present

## 2019-10-27 DIAGNOSIS — R143 Flatulence: Secondary | ICD-10-CM | POA: Diagnosis not present

## 2019-10-27 DIAGNOSIS — F320 Major depressive disorder, single episode, mild: Secondary | ICD-10-CM | POA: Diagnosis not present

## 2019-11-20 DIAGNOSIS — Z85850 Personal history of malignant neoplasm of thyroid: Secondary | ICD-10-CM | POA: Diagnosis not present

## 2019-11-20 DIAGNOSIS — K760 Fatty (change of) liver, not elsewhere classified: Secondary | ICD-10-CM | POA: Diagnosis not present

## 2019-11-20 DIAGNOSIS — F329 Major depressive disorder, single episode, unspecified: Secondary | ICD-10-CM | POA: Diagnosis not present

## 2019-11-20 DIAGNOSIS — K219 Gastro-esophageal reflux disease without esophagitis: Secondary | ICD-10-CM | POA: Diagnosis not present

## 2019-11-20 DIAGNOSIS — F410 Panic disorder [episodic paroxysmal anxiety] without agoraphobia: Secondary | ICD-10-CM | POA: Diagnosis not present

## 2019-11-20 DIAGNOSIS — M47816 Spondylosis without myelopathy or radiculopathy, lumbar region: Secondary | ICD-10-CM | POA: Diagnosis not present

## 2019-11-20 DIAGNOSIS — Z96641 Presence of right artificial hip joint: Secondary | ICD-10-CM | POA: Diagnosis not present

## 2019-11-20 DIAGNOSIS — Z85038 Personal history of other malignant neoplasm of large intestine: Secondary | ICD-10-CM | POA: Diagnosis not present

## 2019-11-20 DIAGNOSIS — Z471 Aftercare following joint replacement surgery: Secondary | ICD-10-CM | POA: Diagnosis not present

## 2019-11-20 DIAGNOSIS — M810 Age-related osteoporosis without current pathological fracture: Secondary | ICD-10-CM | POA: Diagnosis not present

## 2019-11-20 DIAGNOSIS — G609 Hereditary and idiopathic neuropathy, unspecified: Secondary | ICD-10-CM | POA: Diagnosis not present

## 2019-11-20 DIAGNOSIS — Z87442 Personal history of urinary calculi: Secondary | ICD-10-CM | POA: Diagnosis not present

## 2019-11-20 DIAGNOSIS — K580 Irritable bowel syndrome with diarrhea: Secondary | ICD-10-CM | POA: Diagnosis not present

## 2019-11-20 DIAGNOSIS — Z853 Personal history of malignant neoplasm of breast: Secondary | ICD-10-CM | POA: Diagnosis not present

## 2019-11-20 DIAGNOSIS — E039 Hypothyroidism, unspecified: Secondary | ICD-10-CM | POA: Diagnosis not present

## 2019-11-20 DIAGNOSIS — N189 Chronic kidney disease, unspecified: Secondary | ICD-10-CM | POA: Diagnosis not present

## 2019-11-24 DIAGNOSIS — Z471 Aftercare following joint replacement surgery: Secondary | ICD-10-CM | POA: Diagnosis not present

## 2019-12-04 DIAGNOSIS — M1611 Unilateral primary osteoarthritis, right hip: Secondary | ICD-10-CM | POA: Diagnosis not present

## 2019-12-04 DIAGNOSIS — Z96641 Presence of right artificial hip joint: Secondary | ICD-10-CM | POA: Diagnosis not present

## 2019-12-05 ENCOUNTER — Other Ambulatory Visit: Payer: Self-pay | Admitting: Internal Medicine

## 2019-12-05 ENCOUNTER — Ambulatory Visit: Payer: PPO | Admitting: Cardiovascular Disease

## 2019-12-05 DIAGNOSIS — Z87442 Personal history of urinary calculi: Secondary | ICD-10-CM | POA: Diagnosis not present

## 2019-12-05 DIAGNOSIS — R790 Abnormal level of blood mineral: Secondary | ICD-10-CM

## 2019-12-05 DIAGNOSIS — K580 Irritable bowel syndrome with diarrhea: Secondary | ICD-10-CM | POA: Diagnosis not present

## 2019-12-05 DIAGNOSIS — Z85038 Personal history of other malignant neoplasm of large intestine: Secondary | ICD-10-CM | POA: Diagnosis not present

## 2019-12-05 DIAGNOSIS — E039 Hypothyroidism, unspecified: Secondary | ICD-10-CM | POA: Diagnosis not present

## 2019-12-05 DIAGNOSIS — M810 Age-related osteoporosis without current pathological fracture: Secondary | ICD-10-CM | POA: Diagnosis not present

## 2019-12-05 DIAGNOSIS — K760 Fatty (change of) liver, not elsewhere classified: Secondary | ICD-10-CM | POA: Diagnosis not present

## 2019-12-05 DIAGNOSIS — Z85850 Personal history of malignant neoplasm of thyroid: Secondary | ICD-10-CM | POA: Diagnosis not present

## 2019-12-05 DIAGNOSIS — M47816 Spondylosis without myelopathy or radiculopathy, lumbar region: Secondary | ICD-10-CM | POA: Diagnosis not present

## 2019-12-05 DIAGNOSIS — Z471 Aftercare following joint replacement surgery: Secondary | ICD-10-CM | POA: Diagnosis not present

## 2019-12-05 DIAGNOSIS — F329 Major depressive disorder, single episode, unspecified: Secondary | ICD-10-CM | POA: Diagnosis not present

## 2019-12-05 DIAGNOSIS — K219 Gastro-esophageal reflux disease without esophagitis: Secondary | ICD-10-CM | POA: Diagnosis not present

## 2019-12-05 DIAGNOSIS — F410 Panic disorder [episodic paroxysmal anxiety] without agoraphobia: Secondary | ICD-10-CM | POA: Diagnosis not present

## 2019-12-05 DIAGNOSIS — G609 Hereditary and idiopathic neuropathy, unspecified: Secondary | ICD-10-CM | POA: Diagnosis not present

## 2019-12-05 DIAGNOSIS — Z96641 Presence of right artificial hip joint: Secondary | ICD-10-CM | POA: Diagnosis not present

## 2019-12-05 DIAGNOSIS — N189 Chronic kidney disease, unspecified: Secondary | ICD-10-CM | POA: Diagnosis not present

## 2019-12-05 DIAGNOSIS — Z853 Personal history of malignant neoplasm of breast: Secondary | ICD-10-CM | POA: Diagnosis not present

## 2019-12-20 DIAGNOSIS — F329 Major depressive disorder, single episode, unspecified: Secondary | ICD-10-CM | POA: Diagnosis not present

## 2019-12-20 DIAGNOSIS — N189 Chronic kidney disease, unspecified: Secondary | ICD-10-CM | POA: Diagnosis not present

## 2019-12-20 DIAGNOSIS — Z853 Personal history of malignant neoplasm of breast: Secondary | ICD-10-CM | POA: Diagnosis not present

## 2019-12-20 DIAGNOSIS — Z87442 Personal history of urinary calculi: Secondary | ICD-10-CM | POA: Diagnosis not present

## 2019-12-20 DIAGNOSIS — Z85038 Personal history of other malignant neoplasm of large intestine: Secondary | ICD-10-CM | POA: Diagnosis not present

## 2019-12-20 DIAGNOSIS — M47816 Spondylosis without myelopathy or radiculopathy, lumbar region: Secondary | ICD-10-CM | POA: Diagnosis not present

## 2019-12-20 DIAGNOSIS — Z85850 Personal history of malignant neoplasm of thyroid: Secondary | ICD-10-CM | POA: Diagnosis not present

## 2019-12-20 DIAGNOSIS — E039 Hypothyroidism, unspecified: Secondary | ICD-10-CM | POA: Diagnosis not present

## 2019-12-20 DIAGNOSIS — Z96641 Presence of right artificial hip joint: Secondary | ICD-10-CM | POA: Diagnosis not present

## 2019-12-20 DIAGNOSIS — K580 Irritable bowel syndrome with diarrhea: Secondary | ICD-10-CM | POA: Diagnosis not present

## 2019-12-20 DIAGNOSIS — K219 Gastro-esophageal reflux disease without esophagitis: Secondary | ICD-10-CM | POA: Diagnosis not present

## 2019-12-20 DIAGNOSIS — M810 Age-related osteoporosis without current pathological fracture: Secondary | ICD-10-CM | POA: Diagnosis not present

## 2019-12-20 DIAGNOSIS — F410 Panic disorder [episodic paroxysmal anxiety] without agoraphobia: Secondary | ICD-10-CM | POA: Diagnosis not present

## 2019-12-20 DIAGNOSIS — K760 Fatty (change of) liver, not elsewhere classified: Secondary | ICD-10-CM | POA: Diagnosis not present

## 2019-12-20 DIAGNOSIS — Z471 Aftercare following joint replacement surgery: Secondary | ICD-10-CM | POA: Diagnosis not present

## 2019-12-20 DIAGNOSIS — G609 Hereditary and idiopathic neuropathy, unspecified: Secondary | ICD-10-CM | POA: Diagnosis not present

## 2019-12-25 ENCOUNTER — Other Ambulatory Visit: Payer: Self-pay | Admitting: Internal Medicine

## 2020-01-10 DIAGNOSIS — G609 Hereditary and idiopathic neuropathy, unspecified: Secondary | ICD-10-CM | POA: Diagnosis not present

## 2020-01-10 DIAGNOSIS — Z87442 Personal history of urinary calculi: Secondary | ICD-10-CM | POA: Diagnosis not present

## 2020-01-10 DIAGNOSIS — Z853 Personal history of malignant neoplasm of breast: Secondary | ICD-10-CM | POA: Diagnosis not present

## 2020-01-10 DIAGNOSIS — K580 Irritable bowel syndrome with diarrhea: Secondary | ICD-10-CM | POA: Diagnosis not present

## 2020-01-10 DIAGNOSIS — Z85038 Personal history of other malignant neoplasm of large intestine: Secondary | ICD-10-CM | POA: Diagnosis not present

## 2020-01-10 DIAGNOSIS — M47816 Spondylosis without myelopathy or radiculopathy, lumbar region: Secondary | ICD-10-CM | POA: Diagnosis not present

## 2020-01-10 DIAGNOSIS — Z85850 Personal history of malignant neoplasm of thyroid: Secondary | ICD-10-CM | POA: Diagnosis not present

## 2020-01-10 DIAGNOSIS — E039 Hypothyroidism, unspecified: Secondary | ICD-10-CM | POA: Diagnosis not present

## 2020-01-10 DIAGNOSIS — M810 Age-related osteoporosis without current pathological fracture: Secondary | ICD-10-CM | POA: Diagnosis not present

## 2020-01-10 DIAGNOSIS — N189 Chronic kidney disease, unspecified: Secondary | ICD-10-CM | POA: Diagnosis not present

## 2020-01-10 DIAGNOSIS — Z96641 Presence of right artificial hip joint: Secondary | ICD-10-CM | POA: Diagnosis not present

## 2020-01-10 DIAGNOSIS — K219 Gastro-esophageal reflux disease without esophagitis: Secondary | ICD-10-CM | POA: Diagnosis not present

## 2020-01-10 DIAGNOSIS — F410 Panic disorder [episodic paroxysmal anxiety] without agoraphobia: Secondary | ICD-10-CM | POA: Diagnosis not present

## 2020-01-10 DIAGNOSIS — Z471 Aftercare following joint replacement surgery: Secondary | ICD-10-CM | POA: Diagnosis not present

## 2020-01-10 DIAGNOSIS — K760 Fatty (change of) liver, not elsewhere classified: Secondary | ICD-10-CM | POA: Diagnosis not present

## 2020-01-10 DIAGNOSIS — F329 Major depressive disorder, single episode, unspecified: Secondary | ICD-10-CM | POA: Diagnosis not present

## 2020-01-29 ENCOUNTER — Telehealth: Payer: Self-pay | Admitting: Internal Medicine

## 2020-01-29 DIAGNOSIS — Z1231 Encounter for screening mammogram for malignant neoplasm of breast: Secondary | ICD-10-CM

## 2020-01-29 NOTE — Telephone Encounter (Signed)
Pt overdue mammogram.  Need to schedule.

## 2020-01-31 NOTE — Telephone Encounter (Signed)
Will schedule at upcoming appt.

## 2020-02-01 ENCOUNTER — Other Ambulatory Visit: Payer: Self-pay

## 2020-02-01 ENCOUNTER — Telehealth (INDEPENDENT_AMBULATORY_CARE_PROVIDER_SITE_OTHER): Payer: PPO | Admitting: Internal Medicine

## 2020-02-01 DIAGNOSIS — F439 Reaction to severe stress, unspecified: Secondary | ICD-10-CM

## 2020-02-01 DIAGNOSIS — E039 Hypothyroidism, unspecified: Secondary | ICD-10-CM

## 2020-02-01 DIAGNOSIS — F32A Depression, unspecified: Secondary | ICD-10-CM

## 2020-02-01 DIAGNOSIS — D72819 Decreased white blood cell count, unspecified: Secondary | ICD-10-CM | POA: Diagnosis not present

## 2020-02-01 DIAGNOSIS — D649 Anemia, unspecified: Secondary | ICD-10-CM

## 2020-02-01 DIAGNOSIS — E559 Vitamin D deficiency, unspecified: Secondary | ICD-10-CM

## 2020-02-01 DIAGNOSIS — R195 Other fecal abnormalities: Secondary | ICD-10-CM | POA: Diagnosis not present

## 2020-02-01 DIAGNOSIS — G6289 Other specified polyneuropathies: Secondary | ICD-10-CM | POA: Diagnosis not present

## 2020-02-01 DIAGNOSIS — E7800 Pure hypercholesterolemia, unspecified: Secondary | ICD-10-CM

## 2020-02-01 DIAGNOSIS — Z85038 Personal history of other malignant neoplasm of large intestine: Secondary | ICD-10-CM | POA: Diagnosis not present

## 2020-02-01 DIAGNOSIS — K219 Gastro-esophageal reflux disease without esophagitis: Secondary | ICD-10-CM

## 2020-02-01 DIAGNOSIS — Z96641 Presence of right artificial hip joint: Secondary | ICD-10-CM

## 2020-02-01 DIAGNOSIS — Z9109 Other allergy status, other than to drugs and biological substances: Secondary | ICD-10-CM

## 2020-02-01 DIAGNOSIS — F419 Anxiety disorder, unspecified: Secondary | ICD-10-CM

## 2020-02-01 DIAGNOSIS — Z85850 Personal history of malignant neoplasm of thyroid: Secondary | ICD-10-CM

## 2020-02-01 DIAGNOSIS — Z853 Personal history of malignant neoplasm of breast: Secondary | ICD-10-CM

## 2020-02-01 DIAGNOSIS — F320 Major depressive disorder, single episode, mild: Secondary | ICD-10-CM

## 2020-02-01 DIAGNOSIS — R7989 Other specified abnormal findings of blood chemistry: Secondary | ICD-10-CM

## 2020-02-01 DIAGNOSIS — R945 Abnormal results of liver function studies: Secondary | ICD-10-CM

## 2020-02-01 MED ORDER — AZELASTINE-FLUTICASONE 137-50 MCG/ACT NA SUSP: NASAL | 2 refills | Status: DC

## 2020-02-01 NOTE — Progress Notes (Signed)
Patient ID: Debra Hale, female   DOB: 02/28/1951, 69 y.o.   MRN: [id number]   Virtual Visit via video Note  This visit type was conducted due to national recommendations for restrictions regarding the COVID-19 pandemic (e.g. social distancing).  This format is felt to be most appropriate for this patient at this time.  All issues noted in this document were discussed and addressed.  No physical exam was performed (except for noted visual exam findings with Video Visits).   I connected with [name] by a video enabled telemedicine application and verified that I am speaking with the correct person using two identifiers. Location patient: home Location provider: work Persons participating in the virtual visit: patient, provider  The limitations, risks, security and privacy concerns of performing an evaluation and management service by video and the availability of in person appointments have been discussed.  It has also discussed with the patient that there may be a patient responsible charge related to this service. The patient expressed understanding and agreed to proceed.   Reason for visit: scheduled follow up.  HPI: Here for a scheduled follow up.  She is s/p right THR 10/24/19.  States she is walking like a penguin.  Hard to stand from a sitting position.  Has to rock back and forth after standing - before walking.  Still with increased pain.  Feels needs more therapy.  Due to see Dr Roland Rack tomorrow.  Still with pain in her head.  Present for two months.  If uses dymista, resolved.  Discussed using on a regular basis.  She also reports continued problems with chronic diarrhea.  Persistent intermittent issue.  Loose stools.  She saw GI back in the spring.  They had her scheduled for a colonoscopy.  Canceled due to her hip surgery.  Discussed with her today regarding the need for f/u.  She will let me know when agreeable.  She is eating.  No nausea or vomiting.  Seeing Dr Clovis Riley.  Just had f/u  today.  Feels she is doing better.  No chest pain or sob reported.  No chest congestion or cough reported.     ROS: See pertinent positives and negatives per HPI.  Past Medical History:  Diagnosis Date  . Anxiety   . Arthritis    Osteoarthritis  . Breast cancer ([place]) 2009   right breast lumpectomy with rad tx  . Colon cancer ([place])    surgery with chemo and rad tx  . Complication of anesthesia   . GERD (gastroesophageal reflux disease)   . Heart palpitations   . History of hiatal hernia   . History of kidney stones   . Hypothyroidism   . Liver disease   . Liver nodule    s/p negative biopsy  . Malignant neoplasm of thyroid gland ([place]) 2002   s/p surgery and XRT  . Osteoporosis   . Other and unspecified hyperlipidemia   . Palpitations   . Personal history of chemotherapy   . Personal history of malignant neoplasm of large intestine    carcinoma - cecum, s/p right laparoscopic colectomy - s/p chemotherapy and XRT  . Personal history of radiation therapy   . Pneumonia 2019  . PONV (postoperative nausea and vomiting)   . Pure hypercholesterolemia   . Unspecified hereditary and idiopathic peripheral neuropathy     Past Surgical History:  Procedure Laterality Date  . APPENDECTOMY  1985  . BREAST BIOPSY Right 2009   positive  . BREAST BIOPSY Right  2009   negative  . BREAST LUMPECTOMY Right 2009   breast cancer  . CHOLECYSTECTOMY  1995  . COLONOSCOPY    . COLONOSCOPY WITH PROPOFOL N/A 10/29/2017   Procedure: COLONOSCOPY WITH PROPOFOL;  Surgeon: Manya Silvas, MD;  Location: [facility] ENDOSCOPY;  Service: Endoscopy;  Laterality: N/A;  . DILATION AND CURETTAGE OF UTERUS  1990  . DILATION AND CURETTAGE, DIAGNOSTIC / THERAPEUTIC  1990  . ESOPHAGOGASTRODUODENOSCOPY    . ESOPHAGOGASTRODUODENOSCOPY (EGD) WITH PROPOFOL N/A 10/29/2017   Procedure: ESOPHAGOGASTRODUODENOSCOPY (EGD) WITH PROPOFOL;  Surgeon: Manya Silvas, MD;  Location: [facility] ENDOSCOPY;  Service: Endoscopy;   Laterality: N/A;  . LAPAROSCOPIC PARTIAL COLECTOMY     stage 3-C carcinoma of the cecum, s/p chemotherapy and xrt  . LITHOTRIPSY    . SIGMOIDOSCOPY  08/26/1993  . THYROID LOBECTOMY  2002   s/p XRT  . TOTAL HIP ARTHROPLASTY Right 10/24/2019   Procedure: TOTAL HIP ARTHROPLASTY;  Surgeon: Corky Mull, MD;  Location: [facility] ORS;  Service: Orthopedics;  Laterality: Right;    Family History  Problem Relation Age of Onset  . Stroke Mother        4s  . Alzheimer's disease Mother   . Cancer Mother   . Lung cancer Father   . Prostate cancer Father   . Cancer Father        Colon  . Colon cancer Father   . Breast cancer Sister        37's  . Lung cancer Sister   . Breast cancer Maternal Aunt     SOCIAL HX: reviewed.    Current Outpatient Medications:  .  Azelastine-Fluticasone (DYMISTA) 137-50 MCG/ACT SUSP, 2 sprays each nostril q day, Disp: 23 g, Rfl: 2 .  clonazePAM (KLONOPIN) 0.5 MG tablet, Take 1.5 mg by mouth 2 (two) times daily. , Disp: , Rfl: 0 .  colestipol (COLESTID) 1 g tablet, Take 1 g by mouth every morning. TAKES FOR DIARRHEA, Disp: , Rfl:  .  escitalopram (LEXAPRO) 20 MG tablet, Take 20 mg by mouth every morning. , Disp: , Rfl:  .  ibuprofen (ADVIL) 200 MG tablet, Take 200 mg by mouth every 8 (eight) hours as needed for moderate pain., Disp: , Rfl:  .  ibuprofen (ADVIL,MOTRIN) 600 MG tablet, Take 1 tablet (600 mg total) by mouth every 6 (six) hours as needed. (Patient not taking: Reported on 10/11/2019), Disp: 30 tablet, Rfl: 0 .  levothyroxine (SYNTHROID) 112 MCG tablet, TAKE 1 TABLET BY MOUTH EVERY DAY, Disp: 90 tablet, Rfl: 1 .  magnesium oxide (MAG-OX) 400 MG tablet, TAKE 1 TABLET BY MOUTH EVERY DAY, Disp: 90 tablet, Rfl: 1 .  pantoprazole (PROTONIX) 40 MG tablet, TAKE 1 TABLET BY MOUTH EVERY DAY (Patient taking differently: Take 40 mg by mouth every morning. ), Disp: 90 tablet, Rfl: 2 .  potassium chloride (KLOR-CON) 10 MEQ tablet, TAKE 1 TABLET BY MOUTH EVERY DAY, Disp:  90 tablet, Rfl: 1  Current Facility-Administered Medications:  .  ondansetron (ZOFRAN) tablet 8 mg, 8 mg, Oral, Once, Guse, Lauren M, FNP  EXAM:  GENERAL: alert, oriented, appears well and in no acute distress  HEENT: atraumatic, conjunttiva clear, no obvious abnormalities on inspection of external nose and ears  NECK: normal movements of the head and neck  LUNGS: on inspection no signs of respiratory distress, breathing rate appears normal, no obvious gross SOB, gasping or wheezing  CV: no obvious cyanosis  PSYCH/NEURO: pleasant and cooperative, no obvious depression or anxiety, speech and  thought processing grossly intact  ASSESSMENT AND PLAN:  Discussed the following assessment and plan:  GERD (gastroesophageal reflux disease) Controlled. On protonix.   Hypothyroidism On thyroid replacement.  Follow tsh.   Peripheral neuropathy Noted since chemo.  Stable.   Vitamin D deficiency Follow vitamin D level.   Stress Followed by Dr Clovis Riley.  Stable.  On lexapro.   Status post total hip replacement, right Followed by ortho.  Still with pain and issues as outlined.  F/u with ortho as discussed.    Mild depression ([place]) On lexapro.  Followed by Dr Clovis Riley  Loose stools Persistent loose stools as outlined.  Intermittent flares.  Has seen GI.  Colonoscopy canceled due to hip surgery. Discussed f/u with GI and need for f/u colonoscopy.  Will notify me when agreeable.   Leukopenia Follow cbc.   Hypercholesterolemia Low cholesterol diet and exercise.  Follow lipid panel.   History of thyroid cancer On thyroid replacement.  Follow tsh.   History of malignant neoplasm of large intestine Need f/u colonoscopy as outlined.  Will notify me when agreeable   History of breast cancer Mammogram scheduled for 03/25/20  Environmental allergies Does well with dymista.  Headache resolves with dymista.   Anxiety Followed by Dr Clovis Riley.  On clonazepam and lexapro.   Abnormal  liver function test Fatty liver. Has been evaluated by GI.  Had ultrasound and fibroscan - diffuse hepatic steatosis and moderate risk for fibrosis.  Diet, exercise and weight loss.  Follow liver function tests.    Anemia Recheck cbc.  Also check iron studies and B12.     Orders Placed This Encounter  Procedures  . CBC with Differential/Platelet    Standing Status:   Future    Standing Expiration Date:   02/10/2021  . Hepatic function panel    Standing Status:   Future    Standing Expiration Date:   02/10/2021  . Lipid panel    Standing Status:   Future    Standing Expiration Date:   02/10/2021  . TSH    Standing Status:   Future    Standing Expiration Date:   02/10/2021  . Basic metabolic panel    Standing Status:   Future    Standing Expiration Date:   02/10/2021  . IBC + Ferritin    Standing Status:   Future    Standing Expiration Date:   02/10/2021  . Vitamin B12    Standing Status:   Future    Standing Expiration Date:   02/10/2021    Meds ordered this encounter  Medications  . Azelastine-Fluticasone (DYMISTA) 137-50 MCG/ACT SUSP    Sig: 2 sprays each nostril q day    Dispense:  23 g    Refill:  2     I discussed the assessment and treatment plan with the patient. The patient was provided an opportunity to ask questions and all were answered. The patient agreed with the plan and demonstrated an understanding of the instructions.   The patient was advised to call back or seek an in-person evaluation if the symptoms worsen or if the condition fails to improve as anticipated.   [name], MD

## 2020-02-02 DIAGNOSIS — M7061 Trochanteric bursitis, right hip: Secondary | ICD-10-CM | POA: Insufficient documentation

## 2020-02-02 DIAGNOSIS — M7062 Trochanteric bursitis, left hip: Secondary | ICD-10-CM | POA: Insufficient documentation

## 2020-02-02 DIAGNOSIS — M1611 Unilateral primary osteoarthritis, right hip: Secondary | ICD-10-CM | POA: Diagnosis not present

## 2020-02-02 DIAGNOSIS — Z96641 Presence of right artificial hip joint: Secondary | ICD-10-CM | POA: Diagnosis not present

## 2020-02-11 ENCOUNTER — Encounter: Payer: Self-pay | Admitting: Internal Medicine

## 2020-02-11 DIAGNOSIS — D61818 Other pancytopenia: Secondary | ICD-10-CM | POA: Insufficient documentation

## 2020-02-11 DIAGNOSIS — D649 Anemia, unspecified: Secondary | ICD-10-CM | POA: Insufficient documentation

## 2020-02-11 NOTE — Assessment & Plan Note (Signed)
On lexapro.  Followed by Dr Clovis Riley

## 2020-02-11 NOTE — Assessment & Plan Note (Signed)
Persistent loose stools as outlined.  Intermittent flares.  Has seen GI.  Colonoscopy canceled due to hip surgery. Discussed f/u with GI and need for f/u colonoscopy.  Will notify me when agreeable.

## 2020-02-11 NOTE — Assessment & Plan Note (Signed)
Follow vitamin D level.  

## 2020-02-11 NOTE — Assessment & Plan Note (Signed)
Does well with dymista.  Headache resolves with dymista.  

## 2020-02-11 NOTE — Assessment & Plan Note (Signed)
Mammogram scheduled for 03/25/20

## 2020-02-11 NOTE — Assessment & Plan Note (Signed)
Followed by ortho.  Still with pain and issues as outlined.  F/u with ortho as discussed.

## 2020-02-11 NOTE — Assessment & Plan Note (Signed)
Controlled.  On protonix.   

## 2020-02-11 NOTE — Assessment & Plan Note (Signed)
Noted since chemo.  Stable.

## 2020-02-11 NOTE — Assessment & Plan Note (Signed)
On thyroid replacement.  Follow tsh.  

## 2020-02-11 NOTE — Assessment & Plan Note (Signed)
Low cholesterol diet and exercise.  Follow lipid panel.   

## 2020-02-11 NOTE — Assessment & Plan Note (Signed)
Need f/u colonoscopy as outlined.  Will notify me when agreeable

## 2020-02-11 NOTE — Assessment & Plan Note (Signed)
Followed by Dr Clovis Riley.  On clonazepam and lexapro.

## 2020-02-11 NOTE — Assessment & Plan Note (Signed)
Followed by Dr Clovis Riley.  Stable.  On lexapro.

## 2020-02-11 NOTE — Assessment & Plan Note (Signed)
Recheck cbc.  Also check iron studies and B12.

## 2020-02-11 NOTE — Assessment & Plan Note (Signed)
Fatty liver. Has been evaluated by GI.  Had ultrasound and fibroscan - diffuse hepatic steatosis and moderate risk for fibrosis.  Diet, exercise and weight loss.  Follow liver function tests.

## 2020-02-11 NOTE — Assessment & Plan Note (Signed)
Follow cbc.  

## 2020-02-13 ENCOUNTER — Encounter: Payer: Self-pay | Admitting: Physical Therapy

## 2020-02-13 ENCOUNTER — Ambulatory Visit: Payer: PPO | Attending: Emergency Medicine | Admitting: Physical Therapy

## 2020-02-13 ENCOUNTER — Other Ambulatory Visit: Payer: Self-pay

## 2020-02-13 DIAGNOSIS — R2681 Unsteadiness on feet: Secondary | ICD-10-CM | POA: Insufficient documentation

## 2020-02-13 DIAGNOSIS — M25552 Pain in left hip: Secondary | ICD-10-CM | POA: Insufficient documentation

## 2020-02-13 DIAGNOSIS — M25551 Pain in right hip: Secondary | ICD-10-CM | POA: Diagnosis not present

## 2020-02-13 NOTE — Therapy (Signed)
[facility] [address] [place], [address] Phone: [phone]   Fax:  [phone]  Physical Therapy Evaluation  Patient Details  Name: Debra Hale MRN: [id number] Date of Birth: 1950/07/10 Referring Provider (PT):  Odis Hollingshead, MD   Encounter Date: 02/13/2020   PT End of Session - 02/13/20 2013    Visit Number 1    Number of Visits 24    Date for PT Re-Evaluation 05/07/20    Authorization Type [employer] reporting period from 02/13/2020    Progress Note Due on Visit 10    PT Start Time 1520    PT Stop Time 1620    PT Time Calculation (min) 60 min    Equipment Utilized During Treatment Gait belt    Activity Tolerance Patient tolerated treatment well    Behavior During Therapy [facility] for tasks assessed/performed           Past Medical History:  Diagnosis Date  . Anxiety   . Arthritis    Osteoarthritis  . Breast cancer ([place]) 2009   right breast lumpectomy with rad tx  . Colon cancer ([place])    surgery with chemo and rad tx  . Complication of anesthesia   . GERD (gastroesophageal reflux disease)   . Heart palpitations   . History of hiatal hernia   . History of kidney stones   . Hypothyroidism   . Liver disease   . Liver nodule    s/p negative biopsy  . Malignant neoplasm of thyroid gland ([place]) 2002   s/p surgery and XRT  . Osteoporosis   . Other and unspecified hyperlipidemia   . Palpitations   . Personal history of chemotherapy   . Personal history of malignant neoplasm of large intestine    carcinoma - cecum, s/p right laparoscopic colectomy - s/p chemotherapy and XRT  . Personal history of radiation therapy   . Pneumonia 2019  . PONV (postoperative nausea and vomiting)   . Pure hypercholesterolemia   . Unspecified hereditary and idiopathic peripheral neuropathy     Past Surgical History:  Procedure Laterality Date  . APPENDECTOMY  1985  . BREAST BIOPSY Right  2009   positive  . BREAST BIOPSY Right 2009   negative  . BREAST LUMPECTOMY Right 2009   breast cancer  . CHOLECYSTECTOMY  1995  . COLONOSCOPY    . COLONOSCOPY WITH PROPOFOL N/A 10/29/2017   Procedure: COLONOSCOPY WITH PROPOFOL;  Surgeon: Manya Silvas, MD;  Location: [facility] ENDOSCOPY;  Service: Endoscopy;  Laterality: N/A;  . DILATION AND CURETTAGE OF UTERUS  1990  . DILATION AND CURETTAGE, DIAGNOSTIC / THERAPEUTIC  1990  . ESOPHAGOGASTRODUODENOSCOPY    . ESOPHAGOGASTRODUODENOSCOPY (EGD) WITH PROPOFOL N/A 10/29/2017   Procedure: ESOPHAGOGASTRODUODENOSCOPY (EGD) WITH PROPOFOL;  Surgeon: Manya Silvas, MD;  Location: [facility] ENDOSCOPY;  Service: Endoscopy;  Laterality: N/A;  . LAPAROSCOPIC PARTIAL COLECTOMY     stage 3-C carcinoma of the cecum, s/p chemotherapy and xrt  . LITHOTRIPSY    . SIGMOIDOSCOPY  08/26/1993  . THYROID LOBECTOMY  2002   s/p XRT  . TOTAL HIP ARTHROPLASTY Right 10/24/2019   Procedure: TOTAL HIP ARTHROPLASTY;  Surgeon: Corky Mull, MD;  Location: [facility] ORS;  Service: Orthopedics;  Laterality: Right;    There were no vitals filed for this visit.    Subjective Assessment - 02/13/20 1528    Subjective Patient states her right hip was replaced 10/24/2019 and as far as  she remembers everything went okay. Patient states she had outpatient PT prior to her hip replacement but it didn't work because she was bone on bone and she felt that it was a waste of time. After her THA she has had PT at a short term rehab facility and at home prior to coming to outpatient. States her R hip started hurting at least a year prior to surgery for no apparent reason. States she wishes she could walk better. Left hip started hurting when she was recovering from R hip surgery. No injuries or surgeries on the left hip. Denies history of back problems or spinal surgeries. She is here at outpatient PT because she cannot stand up or sit down, walk up and down steps, squat, bend without pain. State  Dr. Roland Rack said he could find the pain at both bursas. Hx of breast cancer and thyroid cancer (completed treatment, does not see oncologist regularly - had screening maybe 2 years ago, rescently spoke with PCP about it and is set up to have tests). Denies numbness or tingling anywhere except her hands.    Pertinent History Patient is a 69 y.o. female who presents to outpatient physical therapy with a referral for medical diagnosis s/p R THA, R and L trochanteric bursitis. Physician specifically requested IT band stretching. This patient's chief complaints consist of B hip pain following R THA on 10/24/2019 leading to the following functional deficits: difficulty stand up or sit down, walk up and down steps, squat, bend without pain, waling prolonged distances (over 30 min), cleaning, cooking, dressing, picking things up off the ground, sleeping. Relevant past medical history and comorbidities include hx of pneumonia and acute pericarditis (resolved), GERD, hypothyroidism, peripheral neuropathy in hands from chemo, osteoporosis, hx R breast cancer (2009), thyroid cancer (2002),colon cancer (afer thyroid cancer and before breast cancer), sinus headaches, cholecystectomy, appendectomy, kidney and gallstones.  Patient denies hx stroke, seizures, lung problem, major cardiac events, diabetes, unexplained weight loss, changes in bowel or bladder problems recently, new onset stumbling or dropping things.    Limitations Lifting;Standing;Walking;House hold activities   Functional Limitations: stand up or sit down, walk up and down steps, squat, bend without pain, waling prolonged distances (over 30 min), cleaning, cooking, dressing, picking things up off the ground, sleeping.   How long can you sit comfortably? "longer I sit the more it hurts when I get up"    How long can you stand comfortably? 20 min    How long can you walk comfortably? 30 min    Patient Stated Goals "to not have any more pain"    Currently in Pain?  No/denies   B: 0/10 (sitting); W: 4-5/10   Pain Score 0-No pain   sitting   Pain Location Hip    Pain Orientation Right;Left   B hips/groin/lateral hip   Pain Descriptors / Indicators Stabbing;Aching    Pain Type Chronic pain;Surgical pain    Pain Onset More than a month ago    Pain Frequency Intermittent    Aggravating Factors  stand up or sit down, walk up and down steps, squat, bend without pain, waling prolonged distances (over 30 min), cleaning, cooking, dressing, picking things up off the ground, sleeping.    Pain Relieving Factors sitting still    Effect of Pain on Daily Activities Functional Limitations: stand up or sit down, walk up and down steps, squat, bend without pain, waling prolonged distances (over 30 min), cleaning, cooking, dressing, picking things up off the ground, sleeping.  [facility] PT Assessment - 02/13/20 0001      Assessment   Medical Diagnosis s/p R THA, R and L trochanteric bursitis    Referring Provider (PT)  Richelle Ito Poggi, MD    Onset Date/Surgical Date 10/24/19    Hand Dominance Right    Next MD Visit maybe november    Prior Therapy prior to sx, post op in hospital, rehab facility, and HHPT      Precautions   Precautions Fall   not that she is aware of; did have posterior approach THA     Restrictions   Weight Bearing Restrictions No      Balance Screen   Has the patient fallen in the past 6 months No   does stumble some   Has the patient had a decrease in activity level because of a fear of falling?  Yes   because it hurts   Is the patient reluctant to leave their home because of a fear of falling?  No      Home Environment   Living Environment Private residence    Living Arrangements Alone    Available Help at Discharge --   no family close by; freinds close by   Type of Home Other(Comment)   [place] Access Elevator;Stairs to enter    Entrance Stairs-Number of Steps --   on 3rd floor   [place] level    [place] - 4 wheels;Cane - quad;Cane - single point;Shower seat;Grab bars - toilet      Prior Function   Level of Independence Independent    Vocation Retired    [employer] work previously    Leisure dance (any kind, Statistician), traveling, shopping, being with freinds, texting, talking on phone      Cognition   Overall Cognitive Status No family/caregiver present to determine baseline cognitive functioning   does seem to have some memory deficits and slow processing     Functional Gait  Assessment   Gait assessed  Yes    Gait Level Surface Walks 20 ft in less than 7 sec but greater than 5.5 sec, uses assistive device, slower speed, mild gait deviations, or deviates 6-10 in outside of the 12 in walkway width.    Change in Gait Speed Able to change speed, demonstrates mild gait deviations, deviates 6-10 in outside of the 12 in walkway width, or no gait deviations, unable to achieve a major change in velocity, or uses a change in velocity, or uses an assistive device.    Gait with Horizontal Head Turns Performs head turns smoothly with no change in gait. Deviates no more than 6 in outside 12 in walkway width    Gait with Vertical Head Turns Performs head turns with no change in gait. Deviates no more than 6 in outside 12 in walkway width.    Gait and Pivot Turn Pivot turns safely within 3 sec and stops quickly with no loss of balance.    Step Over Obstacle Is able to step over 2 stacked shoe boxes taped together (9 in total height) without changing gait speed. No evidence of imbalance.    Gait with Narrow Base of Support Ambulates 4-7 steps.    Gait with Eyes Closed Cannot walk 20 ft without assistance, severe gait deviations or imbalance, deviates greater than 15 in outside 12 in walkway width or will not attempt task.    Ambulating Backwards Walks 20 ft, uses assistive device, slower speed,  mild gait deviations, deviates 6-10 in outside 12 in walkway width.    Steps  Alternating feet, must use rail.    Total Score 21    FGA comment: 19-24 = medium risk fall             OBJECTIVE  OBSERVATION/INSPECTION . Stands with weight shifted to the left., R knee flexed slightly. Iliac crests appear equal height grossly.  . Tremor: none . Muscle bulk: generally WFL bilaterally. May have slightly more muscle bulk on L compared to R.  . Bed mobility: supine <> sit and rolling WFL except painful.  . Transfers: sit<>stand slow and painful, waits to gain balance after standing. (except 5TSTS test was more confident and quick).  . Gait: lack of foot clearance bilaterally, shuffles feet slightly and occasionally trip.  . Stairs: ascend and descend 4 steps step over step with BUE support  NEUROLOGICAL Upper Motor Neuron Screen Hoffman's, and Clonus (ankle) negative bilaterally Dermatomes  . L3-S2 appears equal and intact to light touch. Myotomes . L3-S2 appears intact  SPINE MOTION Lumbar AROM *Indicates pain - Flexion: = mid shins. - Extension: = 25% pain at L anterior hip. - Rotation: B = WFL but pain at  L anterior hip when rotating left.  - Side Flexion: B WFL.   PERIPHERAL JOINT MOTION (in degrees) Active Range of Motion (AROM) *Indicates pain 02/13/20 Date Date  Joint/Motion R/L R/L R/L  Hip     Flexion (knee flex) 100*/105* / /  Comments: B LE AROM WFL for basic mobility except lacking R hip extension.   Passive Range of Motion (PROM) *Indicates pain 02/13/20 Date Date  Joint/Motion R/L R/L R/L  Hip     Flexion (knee flex) 112*/110* / /  Comments: Mild discomfort at end range hip ER/IR. Slightly lacking R hip ER compared to left but appears to be functional ROM. Lacking R hip extension.   MUSCLE PERFORMANCE (MMT):  *Indicates pain 02/13/20 Date Date  Joint/Motion R/L R/L R/L  Hip     Flexion 5*/5* / /  Extension (knee ext) 4*/4+* / /  Abduction 4+*/3+* / /  Adduction 5/ / /  External rotation 4*/4+ / /  Internal rotation  4+*/4+ /  /  Knee     Extension 5/5 / /  Flexion 5/5 / /  Comments: B ankles WFL grossly 4+-5/5. Lacking ROM for R hip extension.   SPECIAL TESTS: OBER: R = lacking hip extension, limited. , L = painful but WFL motion. FADDIR: L = positive for pain   PALPATION: - TTP at bilateral greater trochanters   FUNCTIONAL/BALANCE TESTS:  Five Time Sit to Stand (5TSTS): 12 seconds from 18.5 inch plinth with no UE support (used back of knees on plinth first rep).   Functional Gait Assessment (FGA): 21/30 (moderat fall risk, see details above)  Ten meter walking trial (10MWT): 1.01 meters/second  EDUCATION/COGNITION: Patient is alert and oriented X 4.  Objective measurements completed on examination: See above findings.     TREATMENT:  Denies sensitivity to latex   Therapeutic exercise: to centralize symptoms and improve ROM, strength, muscular endurance, and activity tolerance required for successful completion of functional activities.  - Education on diagnosis, prognosis, POC, anatomy and physiology of current condition.  - attempted standing ITB stretch but patient was unable to replicate or perform safely due, so discontinued until next session.      PT Education - 02/13/20 2013    Education Details Exercise purpose/form. Self management techniques. Education  on diagnosis, prognosis, POC, anatomy and physiology of current condition Education on HEP    Person(s) Educated Patient    Methods Explanation;Demonstration;Tactile cues;Verbal cues    Comprehension Verbalized understanding;Returned demonstration;Verbal cues required;Tactile cues required;Need further instruction            PT Short Term Goals - 02/13/20 1754      PT SHORT TERM GOAL #1   Title Be independent with initial home exercise program for self-management of symptoms.    Baseline to be intiated at visit 2 as appropriate (02/13/2020);    Time 3    Period Weeks    Status New    Target Date 03/05/20             PT  Long Term Goals - 02/13/20 1753      PT LONG TERM GOAL #1   Title Be independent with a long-term home exercise program for self-management of symptoms.    Baseline to be intiated at visit 2 as appropriate (02/13/2020);    Time 12    Period Weeks    Status New   TARGET DATE FOR ALL LONG TERM GOALS: 05/07/2020     PT LONG TERM GOAL #2   Title Demonstrate improved FOTO score to equal or greater than 65 by visit 16#  to demonstrate improvement in overall condition and self-reported functional ability.    Baseline 44 (02/13/2020);    Time 12    Period Weeks    Status New      PT LONG TERM GOAL #3   Title Reduce pain with functional activities to equal or less than 1/10 to allow patient to complete usual activities including ADLs, IADLs, and social engagement with less difficulty.    Baseline up to 5/10 (02/13/2020);    Time 12    Period Weeks    Status New      PT LONG TERM GOAL #4   Title Patient will score equal or greater than 25/30 on functional gait assessment to demonstrate low fall risk.    Baseline 21/30, moderate fall risk (02/13/2020);    Time 12    Period Weeks    Status New      PT LONG TERM GOAL #5   Title Complete community, work and/or recreational activities without limitation due to current condition.    Baseline Functional Limitations: stand up or sit down, walk up and down steps, squat, bend without pain, waling prolonged distances (over 30 min), cleaning, cooking, dressing, picking things up off the ground, sleeping (02/13/2020);    Time 12    Period Weeks    Status New                  Plan - 02/13/20 2020    Clinical Impression Statement Patient is a 69 y.o. female referred to outpatient physical therapy with a medical diagnosis of  s/p R THA, R and L trochanteric bursitis who presents with signs and symptoms consistent with chronic bilateral hip pain and s/p R THA 10/24/2019, and gait pattern that increases risk for falls and stumbling. Patient scored  21/30 on Functional Gait Assessment which demonstrates moderate fall risk. Patient presents with significant pain, ROM, activity tolerance, joints stiffness, balance, gait, muscle performance (power/strength/endurance), and cognitive impairments that are limiting ability to complete her usual activities including standing up or sittting down, walking up and down steps, squatting, bending, walking, cleaning, cooking, dressing, picking things up off the ground, sleeping, etc without difficulty and has increased fall risk.  Patient will benefit from skilled physical therapy intervention to address current body structure impairments and activity limitations to improve function and work towards goals set in current POC in order to return to prior level of function or maximal functional improvement.    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    Examination-Activity Limitations Bend;Locomotion Level;Lift;Stairs;Squat;Stand;Sleep;Dressing;Transfers;Carry;Hygiene/Grooming    Examination-Participation Restrictions Cleaning;Yard Work;Community Activity;Meal Prep;Shop;Laundry;Other   Functional Limitations: stand up or sit down, walk up and down steps, squat, bend without pain, waling prolonged distances (over 30 min), cleaning, cooking, dressing, picking things up off the ground, sleeping   Stability/Clinical Decision Making Evolving/Moderate complexity    Clinical Decision Making Moderate    Rehab Potential Good     PT Frequency 2x / week    PT Duration 12 weeks    PT Treatment/Interventions ADLs/Self Care Home Management;Aquatic Therapy;Cryotherapy;Moist Heat;Gait training;Stair training;DME Instruction;Functional mobility training;Balance training;Neuromuscular re-education;Therapeutic exercise;Therapeutic activities;Cognitive remediation;Patient/family education;Manual techniques;Passive range of motion;Dry needling;Scar mobilization;Joint Manipulations;Spinal Manipulations    PT Next Visit Plan establish HEP    PT Home Exercise Plan to be established next session as appropriate    Consulted and Agree with Plan of Care Patient           Patient will benefit from skilled therapeutic intervention in order to improve the following deficits and impairments:  Abnormal gait, Decreased cognition, Pain, Improper body mechanics, Increased muscle spasms, Decreased mobility, Decreased activity tolerance, Decreased endurance, Decreased range of motion, Decreased strength, Hypomobility, Impaired perceived functional ability, Impaired flexibility, Difficulty walking, Decreased balance  Visit Diagnosis: Pain in right hip  Pain in left hip  Unsteadiness on feet     Problem List Patient Active Problem List   Diagnosis Date Noted  . Anemia 02/11/2020  . 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 Rectal bleed 06/17/2019  . Pleural effusion 12/03/2018  . Right hip pain 12/03/2018  . Leukopenia 08/21/2018  . Change in vision 06/24/2018  . Hypothyroidism 05/02/2018  . Neck pain 05/02/2018  . Nonintractable headache 04/18/2018  . Thrush 04/18/2018  . Pleural effusion associated with pulmonary infection 04/18/2018  . Other fatigue 04/18/2018  . Abdominal pain 03/06/2016  . Acute diarrhea 01/10/2016  . Neck nodule 10/13/2015  . Vitamin D deficiency 07/27/2015  . Loose stools 04/14/2015  . Mild depression ([place]) 02/02/2015  . Health care maintenance 06/17/2014  . Stress 01/28/2014  . Acute  pericarditis 05/31/2013  . Environmental allergies 02/25/2013  . Left elbow pain 02/25/2013  . Abnormal liver function test 12/07/2012  . Anxiety 05/06/2012  . GERD (gastroesophageal reflux disease) 05/06/2012  . Osteoporosis 05/06/2012  . History of breast cancer 09/18/2008  . History of thyroid cancer 09/18/2008  . Hypercholesterolemia 09/18/2008  . Peripheral neuropathy 09/18/2008  . Palpitations 09/18/2008  . History of malignant neoplasm of large intestine 09/18/2008   Everlean Alstrom. Graylon Good, PT, DPT 02/13/20, 8:24 PM  [facility] [address] [place], [address] Phone: [phone]   Fax:  [phone]  Name: Debra Hale MRN: [id number] Date of Birth: [birth year]

## 2020-02-15 ENCOUNTER — Other Ambulatory Visit: Payer: Self-pay

## 2020-02-15 ENCOUNTER — Encounter: Payer: Self-pay | Admitting: Physical Therapy

## 2020-02-15 ENCOUNTER — Ambulatory Visit: Payer: PPO | Admitting: Physical Therapy

## 2020-02-15 DIAGNOSIS — R2681 Unsteadiness on feet: Secondary | ICD-10-CM

## 2020-02-15 DIAGNOSIS — M25551 Pain in right hip: Secondary | ICD-10-CM | POA: Diagnosis not present

## 2020-02-15 DIAGNOSIS — M25552 Pain in left hip: Secondary | ICD-10-CM

## 2020-02-15 NOTE — Therapy (Signed)
[facility] [address], [place], [address] Phone: [phone]   Fax:  [phone]  Physical Therapy Treatment  Patient Details  Name: Debra Hale MRN: [id number] Date of Birth: [birth year] Referring Provider (PT):  Odis Hollingshead, MD   Encounter Date: 02/15/2020   PT End of Session - 02/15/20 1128    Visit Number 2    Number of Visits 24    Date for PT Re-Evaluation 05/07/20    Authorization Type [employer] reporting period from 02/13/2020    Progress Note Due on Visit 10    PT Start Time 1120    PT Stop Time 1200    PT Time Calculation (min) 40 min    Equipment Utilized During Treatment Gait belt    Activity Tolerance Patient tolerated treatment well    Behavior During Therapy [facility] for tasks assessed/performed           Past Medical History:  Diagnosis Date   Anxiety    Arthritis    Osteoarthritis   Breast cancer ([place]) 2009   right breast lumpectomy with rad tx   Colon cancer ([place])    surgery with chemo and rad tx   Complication of anesthesia    GERD (gastroesophageal reflux disease)    Heart palpitations    History of hiatal hernia    History of kidney stones    Hypothyroidism    Liver disease    Liver nodule    s/p negative biopsy   Malignant neoplasm of thyroid gland ([place]) 2002   s/p surgery and XRT   Osteoporosis    Other and unspecified hyperlipidemia    Palpitations    Personal history of chemotherapy    Personal history of malignant neoplasm of large intestine    carcinoma - cecum, s/p right laparoscopic colectomy - s/p chemotherapy and XRT   Personal history of radiation therapy    Pneumonia 2019   PONV (postoperative nausea and vomiting)    Pure hypercholesterolemia    Unspecified hereditary and idiopathic peripheral neuropathy     Past Surgical History:  Procedure Laterality Date   APPENDECTOMY  1985   BREAST BIOPSY Right  2009   positive   BREAST BIOPSY Right 2009   negative   BREAST LUMPECTOMY Right 2009   breast cancer   CHOLECYSTECTOMY  1995   COLONOSCOPY     COLONOSCOPY WITH PROPOFOL N/A 10/29/2017   Procedure: COLONOSCOPY WITH PROPOFOL;  Surgeon: Manya Silvas, MD;  Location: [facility] ENDOSCOPY;  Service: Endoscopy;  Laterality: N/A;   DILATION AND CURETTAGE OF UTERUS  1990   DILATION AND CURETTAGE, DIAGNOSTIC / THERAPEUTIC  1990   ESOPHAGOGASTRODUODENOSCOPY     ESOPHAGOGASTRODUODENOSCOPY (EGD) WITH PROPOFOL N/A 10/29/2017   Procedure: ESOPHAGOGASTRODUODENOSCOPY (EGD) WITH PROPOFOL;  Surgeon: Manya Silvas, MD;  Location: [facility] ENDOSCOPY;  Service: Endoscopy;  Laterality: N/A;   LAPAROSCOPIC PARTIAL COLECTOMY     stage 3-C carcinoma of the cecum, s/p chemotherapy and xrt   LITHOTRIPSY     SIGMOIDOSCOPY  08/26/1993   THYROID LOBECTOMY  2002   s/p XRT   TOTAL HIP ARTHROPLASTY Right 10/24/2019   Procedure: TOTAL HIP ARTHROPLASTY;  Surgeon: Corky Mull, MD;  Location: [facility] ORS;  Service: Orthopedics;  Laterality: Right;    There were no vitals filed for this visit.   Subjective Assessment - 02/15/20 1126    Subjective Patient reports she was pretty sore following last PT session and felt like  her whole body hurt. She is feeling better today with 2/10 pain as a "catch" at the left lateral hip.    Pertinent History Patient is a 69 y.o. female who presents to outpatient physical therapy with a referral for medical diagnosis s/p R THA, R and L trochanteric bursitis. Physician specifically requested IT band stretching. This patient's chief complaints consist of B hip pain following R THA on 10/24/2019 leading to the following functional deficits: difficulty stand up or sit down, walk up and down steps, squat, bend without pain, waling prolonged distances (over 30 min), cleaning, cooking, dressing, picking things up off the ground, sleeping. Relevant past medical history and comorbidities  include hx of pneumonia and acute pericarditis (resolved), GERD, hypothyroidism, peripheral neuropathy in hands from chemo, osteoporosis, hx R breast cancer (2009), thyroid cancer (2002),colon cancer (afer thyroid cancer and before breast cancer), sinus headaches, cholecystectomy, appendectomy, kidney and gallstones.  Patient denies hx stroke, seizures, lung problem, major cardiac events, diabetes, unexplained weight loss, changes in bowel or bladder problems recently, new onset stumbling or dropping things.    Limitations Lifting;Standing;Walking;House hold activities   Functional Limitations: stand up or sit down, walk up and down steps, squat, bend without pain, waling prolonged distances (over 30 min), cleaning, cooking, dressing, picking things up off the ground, sleeping.   How long can you sit comfortably? "longer I sit the more it hurts when I get up"    How long can you stand comfortably? 20 min    How long can you walk comfortably? 30 min    Patient Stated Goals "to not have any more pain"    Currently in Pain? Yes    Pain Score 2     Pain Onset More than a month ago             TREATMENT:  Denies sensitivity to latex   Therapeutic exercise:to centralize symptoms and improve ROM, strength, muscular endurance, and activity tolerance required for successful completion of functional activities. - NuStep level 3 using bilateral lower extremities. Seat setting 8. For improved extremity mobility, muscular endurance, and activity tolerance; and to induce the analgesic effect of aerobic exercise, stimulate improved joint nutrition, and prepare body structures and systems for following interventions. x 6  minutes. Average SPM = 76 - sit <> stand 3x10 with green band around knees with heavy cuing to keep knees neutral or slightly abducted against the band to improve hip strength.  - standing ITB stretch at wall 5 reps each side of 30 seconds plus additional time for reviewing and modification  of HEP hand out until patient is able to perform exercise safely and adequately with prompts of handout alone. Performed spaced repetition with last position check following other exercise.  -  hooklying glute bridge 3x10   Manual therapy: to reduce pain and tissue tension, improve range of motion, neuromodulation, in order to promote improved ability to complete functional activities. - supine L hip long axis distraction with oscillation grade II-IV,  3 x30 seconds.    HOME EXERCISE PROGRAM Access Code: [email or web address] URL: [email or web address] Date: 02/15/2020 Prepared by: Rosita Kea  Exercises ITB Stretch at Wall - 1 x daily - 3 reps - 30 seconds hold ITB Stretch at [place] - 1 x daily - 3 reps - 30 seconds hold Bridge - 1 x daily - 3 sets - 10 reps - 5 seconds hold    PT Education - 02/15/20 1128    Education Details Exercise purpose/form. Self management techniques. HEP  Person(s) Educated Patient    Methods Explanation;Demonstration;Tactile cues;Verbal cues;Handout    Comprehension Verbalized understanding;Returned demonstration;Verbal cues required;Tactile cues required;Need further instruction            PT Short Term Goals - 02/13/20 1754      PT SHORT TERM GOAL #1   Title Be independent with initial home exercise program for self-management of symptoms.    Baseline to be intiated at visit 2 as appropriate (02/13/2020);    Time 3    Period Weeks    Status New    Target Date 03/05/20             PT Long Term Goals - 02/13/20 1753      PT LONG TERM GOAL #1   Title Be independent with a long-term home exercise program for self-management of symptoms.    Baseline to be intiated at visit 2 as appropriate (02/13/2020);    Time 12    Period Weeks    Status New   TARGET DATE FOR ALL LONG TERM GOALS: 05/07/2020     PT LONG TERM GOAL #2   Title Demonstrate improved FOTO score to equal or greater than 65 by visit 16#  to demonstrate improvement in overall  condition and self-reported functional ability.    Baseline 44 (02/13/2020);    Time 12    Period Weeks    Status New      PT LONG TERM GOAL #3   Title Reduce pain with functional activities to equal or less than 1/10 to allow patient to complete usual activities including ADLs, IADLs, and social engagement with less difficulty.    Baseline up to 5/10 (02/13/2020);    Time 12    Period Weeks    Status New      PT LONG TERM GOAL #4   Title Patient will score equal or greater than 25/30 on functional gait assessment to demonstrate low fall risk.    Baseline 21/30, moderate fall risk (02/13/2020);    Time 12    Period Weeks    Status New      PT LONG TERM GOAL #5   Title Complete community, work and/or recreational activities without limitation due to current condition.    Baseline Functional Limitations: stand up or sit down, walk up and down steps, squat, bend without pain, waling prolonged distances (over 30 min), cleaning, cooking, dressing, picking things up off the ground, sleeping (02/13/2020);    Time 12    Period Weeks    Status New                 Plan - 02/15/20 2023    Clinical Impression Statement Patient tolerated treatment well overall with no increased pain but did require extra time and attention with multiple trials and practice to teach patient how to properly perform HEP with handout. Patient has decreased cognition that requires repeated and slow cuing to perform exercises properly and educate patient about condition. Patient continues to shuffle feet when ambulating. Unclear if she got any relief from long axis distraction. Did report popping at left hip during bridges. Patient would benefit from continued management of limiting condition by skilled physical therapist to address remaining impairments and functional limitations to work towards stated goals and return to PLOF or maximal functional independence.    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    Examination-Activity Limitations Bend;Locomotion Level;Lift;Stairs;Squat;Stand;Sleep;Dressing;Transfers;Carry;Hygiene/Grooming    Examination-Participation Restrictions Cleaning;Yard Work;Community Activity;Meal Prep;Shop;Laundry;Other   Functional Limitations: stand up or sit down, walk up and down steps, squat, bend without pain, waling prolonged distances (over 30 min), cleaning, cooking, dressing, picking things up off the ground, sleeping   Stability/Clinical Decision Making Evolving/Moderate complexity    Rehab Potential Good    PT Frequency 2x / week    PT Duration 12 weeks    PT Treatment/Interventions ADLs/Self Care Home Management;Aquatic Therapy;Cryotherapy;Moist Heat;Gait training;Stair training;DME Instruction;Functional mobility training;Balance training;Neuromuscular re-education;Therapeutic exercise;Therapeutic activities;Cognitive remediation;Patient/family education;Manual techniques;Passive range of motion;Dry needling;Scar mobilization;Joint Manipulations;Spinal Manipulations    PT Next Visit Plan progressive glute, LE, and functional strengthening and flexibility exercises    PT Home Exercise Plan Medbridge Access Code: [email or web address]    Consulted and Agree with Plan of Care Patient           Patient will benefit from skilled therapeutic intervention in order to improve the following deficits and impairments:  Abnormal gait, Decreased cognition, Pain, Improper body  mechanics, Increased muscle spasms, Decreased mobility, Decreased activity tolerance, Decreased endurance, Decreased range of motion, Decreased strength, Hypomobility, Impaired perceived functional ability, Impaired flexibility, Difficulty walking, Decreased balance  Visit Diagnosis: Pain in right hip  Pain in left hip  Unsteadiness on feet     Problem List Patient Active Problem List   Diagnosis Date Noted   Anemia 02/11/2020   Status post total hip replacement, 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   Rectal bleed 06/17/2019   Pleural effusion 12/03/2018   Right hip pain 12/03/2018   Leukopenia 08/21/2018   Change in vision 06/24/2018   Hypothyroidism 05/02/2018   Neck pain 05/02/2018   Nonintractable headache 04/18/2018   Thrush 04/18/2018   Pleural effusion associated with pulmonary infection 04/18/2018   Other fatigue 04/18/2018   Abdominal pain 03/06/2016   Acute diarrhea 01/10/2016   Neck nodule 10/13/2015   Vitamin D deficiency 07/27/2015   Loose stools 04/14/2015   Mild depression ([place]) 02/02/2015   Health care maintenance 06/17/2014   Stress 01/28/2014   Acute pericarditis 05/31/2013   Environmental allergies 02/25/2013   Left elbow pain 02/25/2013   Abnormal liver function test 12/07/2012   Anxiety 05/06/2012   GERD (gastroesophageal reflux disease) 05/06/2012   Osteoporosis 05/06/2012   History of breast cancer 09/18/2008   History of thyroid cancer 09/18/2008   Hypercholesterolemia 09/18/2008   Peripheral neuropathy 09/18/2008   Palpitations 09/18/2008   History of malignant neoplasm of large intestine 09/18/2008    Everlean Alstrom. Graylon Good, PT, DPT 02/15/20, 8:25 PM  [facility] [address] [place], [address] Phone: [phone]   Fax:  [phone]  Name: Debra Hale MRN: [id number] Date of Birth: Jan 12, 1951

## 2020-02-19 ENCOUNTER — Encounter: Payer: Self-pay | Admitting: Physical Therapy

## 2020-02-19 ENCOUNTER — Other Ambulatory Visit: Payer: Self-pay

## 2020-02-19 ENCOUNTER — Ambulatory Visit: Payer: PPO | Admitting: Physical Therapy

## 2020-02-19 DIAGNOSIS — M25552 Pain in left hip: Secondary | ICD-10-CM

## 2020-02-19 DIAGNOSIS — R2681 Unsteadiness on feet: Secondary | ICD-10-CM

## 2020-02-19 DIAGNOSIS — M25551 Pain in right hip: Secondary | ICD-10-CM | POA: Diagnosis not present

## 2020-02-19 NOTE — Therapy (Signed)
[facility] [address] [place], [place], [address] Phone: [phone]   Fax:  [phone]  Physical Therapy Treatment  Patient Details  Name: Debra Hale MRN: [id number] Date of Birth: 1951-05-03 Referring Provider (PT):  Odis Hollingshead, MD   Encounter Date: 02/19/2020   PT End of Session - 02/19/20 1359    Visit Number 3    Number of Visits 24    Date for PT Re-Evaluation 05/07/20    Authorization Type [employer] reporting period from 02/13/2020    Progress Note Due on Visit 10    PT Start Time 1350    PT Stop Time 1430    PT Time Calculation (min) 40 min    Equipment Utilized During Treatment Gait belt    Activity Tolerance Patient tolerated treatment well    Behavior During Therapy [facility] for tasks assessed/performed           Past Medical History:  Diagnosis Date   Anxiety    Arthritis    Osteoarthritis   Breast cancer ([place]) 2009   right breast lumpectomy with rad tx   Colon cancer ([place])    surgery with chemo and rad tx   Complication of anesthesia    GERD (gastroesophageal reflux disease)    Heart palpitations    History of hiatal hernia    History of kidney stones    Hypothyroidism    Liver disease    Liver nodule    s/p negative biopsy   Malignant neoplasm of thyroid gland ([place]) 2002   s/p surgery and XRT   Osteoporosis    Other and unspecified hyperlipidemia    Palpitations    Personal history of chemotherapy    Personal history of malignant neoplasm of large intestine    carcinoma - cecum, s/p right laparoscopic colectomy - s/p chemotherapy and XRT   Personal history of radiation therapy    Pneumonia 2019   PONV (postoperative nausea and vomiting)    Pure hypercholesterolemia    Unspecified hereditary and idiopathic peripheral neuropathy     Past Surgical History:  Procedure Laterality Date   APPENDECTOMY  1985   BREAST BIOPSY Right  2009   positive   BREAST BIOPSY Right 2009   negative   BREAST LUMPECTOMY Right 2009   breast cancer   CHOLECYSTECTOMY  1995   COLONOSCOPY     COLONOSCOPY WITH PROPOFOL N/A 10/29/2017   Procedure: COLONOSCOPY WITH PROPOFOL;  Surgeon: Manya Silvas, MD;  Location: [facility] ENDOSCOPY;  Service: Endoscopy;  Laterality: N/A;   DILATION AND CURETTAGE OF UTERUS  1990   DILATION AND CURETTAGE, DIAGNOSTIC / THERAPEUTIC  1990   ESOPHAGOGASTRODUODENOSCOPY     ESOPHAGOGASTRODUODENOSCOPY (EGD) WITH PROPOFOL N/A 10/29/2017   Procedure: ESOPHAGOGASTRODUODENOSCOPY (EGD) WITH PROPOFOL;  Surgeon: Manya Silvas, MD;  Location: [facility] ENDOSCOPY;  Service: Endoscopy;  Laterality: N/A;   LAPAROSCOPIC PARTIAL COLECTOMY     stage 3-C carcinoma of the cecum, s/p chemotherapy and xrt   LITHOTRIPSY     SIGMOIDOSCOPY  08/26/1993   THYROID LOBECTOMY  2002   s/p XRT   TOTAL HIP ARTHROPLASTY Right 10/24/2019   Procedure: TOTAL HIP ARTHROPLASTY;  Surgeon: Corky Mull, MD;  Location: [facility] ORS;  Service: Orthopedics;  Laterality: Right;    There were no vitals filed for this visit.   Subjective Assessment - 02/19/20 1353    Subjective Patient reports she was so sore starting the day following her last PT  session that she could not get out of bed the next morning without feeling it a lot. She felt it in her glutes and quads. It got worse every time she spent time sititng for a long time. She states this got better over time and today the soreness is better but she still has some pain in her left hip maybe 2/10. It continues to catch there as well.    Pertinent History Patient is a 69 y.o. female who presents to outpatient physical therapy with a referral for medical diagnosis s/p R THA, R and L trochanteric bursitis. Physician specifically requested IT band stretching. This patient's chief complaints consist of B hip pain following R THA on 10/24/2019 leading to the following functional deficits: difficulty  stand up or sit down, walk up and down steps, squat, bend without pain, waling prolonged distances (over 30 min), cleaning, cooking, dressing, picking things up off the ground, sleeping. Relevant past medical history and comorbidities include hx of pneumonia and acute pericarditis (resolved), GERD, hypothyroidism, peripheral neuropathy in hands from chemo, osteoporosis, hx R breast cancer (2009), thyroid cancer (2002),colon cancer (afer thyroid cancer and before breast cancer), sinus headaches, cholecystectomy, appendectomy, kidney and gallstones.  Patient denies hx stroke, seizures, lung problem, major cardiac events, diabetes, unexplained weight loss, changes in bowel or bladder problems recently, new onset stumbling or dropping things.    Limitations Lifting;Standing;Walking;House hold activities   Functional Limitations: stand up or sit down, walk up and down steps, squat, bend without pain, waling prolonged distances (over 30 min), cleaning, cooking, dressing, picking things up off the ground, sleeping.   How long can you sit comfortably? "longer I sit the more it hurts when I get up"    How long can you stand comfortably? 20 min    How long can you walk comfortably? 30 min    Patient Stated Goals "to not have any more pain"    Currently in Pain? Yes    Pain Score 2     Pain Onset More than a month ago           TREATMENT: Denies sensitivity to latex  Therapeutic exercise:to centralize symptoms and improve ROM, strength, muscular endurance, and activity tolerance required for successful completion of functional activities. - NuStep level 3 using bilateral lower extremities. Seat setting 8. For improved extremity mobility, muscular endurance, and activity tolerance; and to induce the analgesic effect of aerobic exercise, stimulate improved joint nutrition, and prepare body structures and systems for following interventions. x 5  minutes. Average SPM = 76 - standing ITB stretch at wall 3 reps  each side of 30 seconds plus additional time for reviewing techique  - sit <> stand 3x10 with green band around knees with heavy cuing to keep knees neutral or slightly abducted against the band to improve hip strength.  -  hooklying glute bridge with green theraband around distal thighs with  3x10   Manual therapy: to reduce pain and tissue tension, improve range of motion, neuromodulation, in order to promote improved ability to complete functional activities. - supine L hip long axis distraction with oscillation grade II-IV,  3 x30 seconds.  - supine L hip posterior and lateral joint mobilization grade II-IV, 1x30 seconds each (discontinued due to groin pain).   HOME EXERCISE PROGRAM Access Code: [email or web address] URL: [email or web address] Date: 02/15/2020 Prepared by: Rosita Kea  Exercises ITB Stretch at Wall - 1 x daily - 3 reps - 30 seconds hold ITB Stretch at [place] - 1 x  daily - 3 reps - 30 seconds hold Bridge - 1 x daily - 3 sets - 10 reps - 5 seconds hold    PT Education - 02/19/20 1359    Education Details Exercise purpose/form. Self management techniques.    Person(s) Educated Patient    Methods Explanation;Tactile cues;Demonstration;Verbal cues    Comprehension Verbalized understanding;Returned demonstration;Verbal cues required;Tactile cues required;Need further instruction            PT Short Term Goals - 02/13/20 1754      PT SHORT TERM GOAL #1   Title Be independent with initial home exercise program for self-management of symptoms.    Baseline to be intiated at visit 2 as appropriate (02/13/2020);    Time 3    Period Weeks    Status New    Target Date 03/05/20             PT Long Term Goals - 02/13/20 1753      PT LONG TERM GOAL #1   Title Be independent with a long-term home exercise program for self-management of symptoms.    Baseline to be intiated at visit 2 as appropriate (02/13/2020);    Time 12    Period Weeks    Status New    TARGET DATE FOR ALL LONG TERM GOALS: 05/07/2020     PT LONG TERM GOAL #2   Title Demonstrate improved FOTO score to equal or greater than 65 by visit 16#  to demonstrate improvement in overall condition and self-reported functional ability.    Baseline 44 (02/13/2020);    Time 12    Period Weeks    Status New      PT LONG TERM GOAL #3   Title Reduce pain with functional activities to equal or less than 1/10 to allow patient to complete usual activities including ADLs, IADLs, and social engagement with less difficulty.    Baseline up to 5/10 (02/13/2020);    Time 12    Period Weeks    Status New      PT LONG TERM GOAL #4   Title Patient will score equal or greater than 25/30 on functional gait assessment to demonstrate low fall risk.    Baseline 21/30, moderate fall risk (02/13/2020);    Time 12    Period Weeks    Status New      PT LONG TERM GOAL #5   Title Complete community, work and/or recreational activities without limitation due to current condition.    Baseline Functional Limitations: stand up or sit down, walk up and down steps, squat, bend without pain, waling prolonged distances (over 30 min), cleaning, cooking, dressing, picking things up off the ground, sleeping (02/13/2020);    Time 12    Period Weeks    Status New                 Plan - 02/19/20 1432    Clinical Impression Statement Patient tolerated treatment well overall and felt no pain in the hips by end of session. Is continuing to have painful catching in the left hip that also occurs in PT during bridges. Did not progress exercises due to significant DOMS following last session. Patient would benefit from continued management of limiting condition by skilled physical therapist to address remaining impairments and functional limitations to work towards stated goals and return to PLOF or maximal functional independence.    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    Examination-Activity Limitations Bend;Locomotion Level;Lift;Stairs;Squat;Stand;Sleep;Dressing;Transfers;Carry;Hygiene/Grooming    Examination-Participation Restrictions Cleaning;Yard Work;Community Activity;Meal Prep;Shop;Laundry;Other   Functional Limitations: stand up or sit down, walk up and down steps, squat, bend without pain, waling prolonged distances (over 30 min), cleaning, cooking, dressing, picking things up off the ground, sleeping   Stability/Clinical Decision Making Evolving/Moderate complexity    Rehab Potential Good    PT Frequency 2x / week    PT Duration 12 weeks    PT Treatment/Interventions ADLs/Self Care Home Management;Aquatic Therapy;Cryotherapy;Moist Heat;Gait training;Stair training;DME Instruction;Functional mobility training;Balance training;Neuromuscular re-education;Therapeutic exercise;Therapeutic activities;Cognitive remediation;Patient/family education;Manual techniques;Passive range of motion;Dry needling;Scar mobilization;Joint Manipulations;Spinal Manipulations    PT Next Visit Plan progressive glute, LE, and functional strengthening and flexibility exercises    PT Home Exercise Plan Medbridge Access Code: [email or web address]    Consulted and Agree with Plan of Care Patient           Patient will benefit from skilled therapeutic intervention in order to improve the following deficits and impairments:  Abnormal gait, Decreased cognition, Pain, Improper body mechanics, Increased muscle spasms, Decreased mobility,  Decreased activity tolerance, Decreased endurance, Decreased range of motion, Decreased strength, Hypomobility, Impaired perceived functional ability, Impaired flexibility, Difficulty walking, Decreased balance  Visit Diagnosis: Pain in right hip  Pain in left hip  Unsteadiness on feet     Problem List Patient Active Problem List   Diagnosis Date Noted   Anemia 02/11/2020   Status post total hip replacement, 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   Rectal bleed 06/17/2019   Pleural effusion 12/03/2018   Right hip pain 12/03/2018   Leukopenia 08/21/2018   Change in vision 06/24/2018   Hypothyroidism 05/02/2018   Neck pain 05/02/2018   Nonintractable headache 04/18/2018   Thrush 04/18/2018   Pleural effusion associated with pulmonary infection 04/18/2018   Other fatigue 04/18/2018   Abdominal pain 03/06/2016   Acute diarrhea 01/10/2016   Neck nodule 10/13/2015   Vitamin D deficiency 07/27/2015   Loose stools 04/14/2015   Mild depression ([place]) 02/02/2015   Health care maintenance 06/17/2014   Stress 01/28/2014   Acute pericarditis 05/31/2013   Environmental allergies 02/25/2013   Left elbow pain 02/25/2013   Abnormal liver function test 12/07/2012   Anxiety 05/06/2012   GERD (gastroesophageal reflux disease) 05/06/2012   Osteoporosis 05/06/2012   History of breast cancer 09/18/2008   History of thyroid cancer 09/18/2008   Hypercholesterolemia 09/18/2008   Peripheral neuropathy 09/18/2008   Palpitations 09/18/2008   History of malignant neoplasm of large intestine 09/18/2008   Everlean Alstrom. Graylon Good, PT, DPT 02/19/20, 2:34 PM  [facility] [address], [place], [address] Phone: [phone]   Fax:  [phone]  Name: Debra Hale MRN: [id number] Date of Birth: 06-01-1950

## 2020-02-20 ENCOUNTER — Other Ambulatory Visit (INDEPENDENT_AMBULATORY_CARE_PROVIDER_SITE_OTHER): Payer: PPO

## 2020-02-20 DIAGNOSIS — D649 Anemia, unspecified: Secondary | ICD-10-CM

## 2020-02-20 DIAGNOSIS — E7800 Pure hypercholesterolemia, unspecified: Secondary | ICD-10-CM

## 2020-02-20 DIAGNOSIS — R945 Abnormal results of liver function studies: Secondary | ICD-10-CM | POA: Diagnosis not present

## 2020-02-20 DIAGNOSIS — R7989 Other specified abnormal findings of blood chemistry: Secondary | ICD-10-CM

## 2020-02-20 DIAGNOSIS — Z85850 Personal history of malignant neoplasm of thyroid: Secondary | ICD-10-CM

## 2020-02-20 LAB — LIPID PANEL
Cholesterol: 163 mg/dL (ref 0–200)
HDL: 40.7 mg/dL (ref >39.00)
LDL Cholesterol: 100 mg/dL — ABNORMAL HIGH (ref 0–99)
NonHDL: 122.17
Total CHOL/HDL Ratio: 4
Triglycerides: 110 mg/dL (ref 0.0–149.0)
VLDL: 22 mg/dL (ref 0.0–40.0)

## 2020-02-20 LAB — CBC WITH DIFFERENTIAL/PLATELET
Basophils Absolute: 0 10*3/uL (ref 0.0–0.1)
Basophils Relative: 0.7 % (ref 0.0–3.0)
Eosinophils Absolute: 0 10*3/uL (ref 0.0–0.7)
Eosinophils Relative: 0.8 % (ref 0.0–5.0)
HCT: 40.2 % (ref 36.0–46.0)
Hemoglobin: 13.8 g/dL (ref 12.0–15.0)
Lymphocytes Relative: 43.7 % (ref 12.0–46.0)
Lymphs Abs: 1 10*3/uL (ref 0.7–4.0)
MCHC: 34.3 g/dL (ref 30.0–36.0)
MCV: 81.5 fl (ref 78.0–100.0)
Monocytes Absolute: 0.1 10*3/uL (ref 0.1–1.0)
Monocytes Relative: 2.8 % — ABNORMAL LOW (ref 3.0–12.0)
Neutro Abs: 1.2 10*3/uL — ABNORMAL LOW (ref 1.4–7.7)
Neutrophils Relative %: 52 % (ref 43.0–77.0)
Platelets: 217 10*3/uL (ref 150.0–400.0)
RBC: 4.93 Mil/uL (ref 3.87–5.11)
RDW: 14.7 % (ref 11.5–15.5)
WBC: 2.2 10*3/uL — ABNORMAL LOW (ref 4.0–10.5)

## 2020-02-20 LAB — HEPATIC FUNCTION PANEL
ALT: 20 U/L (ref 0–35)
AST: 12 U/L (ref 0–37)
Albumin: 3.9 g/dL (ref 3.5–5.2)
Alkaline Phosphatase: 111 U/L (ref 39–117)
Bilirubin, Direct: 0.1 mg/dL (ref 0.0–0.3)
Total Bilirubin: 0.7 mg/dL (ref 0.2–1.2)
Total Protein: 7.1 g/dL (ref 6.0–8.3)

## 2020-02-20 LAB — TSH: TSH: 17.16 u[IU]/mL — ABNORMAL HIGH (ref 0.35–4.50)

## 2020-02-20 LAB — BASIC METABOLIC PANEL WITH GFR
BUN: 13 mg/dL (ref 6–23)
CO2: 29 meq/L (ref 19–32)
Calcium: 8.9 mg/dL (ref 8.4–10.5)
Chloride: 105 meq/L (ref 96–112)
Creatinine, Ser: 0.98 mg/dL (ref 0.40–1.20)
GFR: 58.34 mL/min — ABNORMAL LOW
Glucose, Bld: 98 mg/dL (ref 70–99)
Potassium: 3.1 meq/L — ABNORMAL LOW (ref 3.5–5.1)
Sodium: 143 meq/L (ref 135–145)

## 2020-02-20 LAB — VITAMIN B12: Vitamin B-12: 229 pg/mL (ref 211–911)

## 2020-02-20 LAB — IBC + FERRITIN
Ferritin: 52.6 ng/mL (ref 10.0–291.0)
Iron: 59 ug/dL (ref 42–145)
Saturation Ratios: 14.7 % — ABNORMAL LOW (ref 20.0–50.0)
Transferrin: 287 mg/dL (ref 212.0–360.0)

## 2020-02-22 ENCOUNTER — Ambulatory Visit: Payer: PPO | Admitting: Physical Therapy

## 2020-02-22 ENCOUNTER — Telehealth: Payer: Self-pay | Admitting: Internal Medicine

## 2020-02-22 ENCOUNTER — Encounter: Payer: Self-pay | Admitting: Physical Therapy

## 2020-02-22 ENCOUNTER — Other Ambulatory Visit: Payer: Self-pay

## 2020-02-22 DIAGNOSIS — M25551 Pain in right hip: Secondary | ICD-10-CM | POA: Diagnosis not present

## 2020-02-22 DIAGNOSIS — M25552 Pain in left hip: Secondary | ICD-10-CM

## 2020-02-22 DIAGNOSIS — R2681 Unsteadiness on feet: Secondary | ICD-10-CM

## 2020-02-22 NOTE — Telephone Encounter (Signed)
Pt called to get lab results °

## 2020-02-22 NOTE — Telephone Encounter (Signed)
See result note.  

## 2020-02-22 NOTE — Therapy (Signed)
[facility] [address] [place], [address] Phone: [phone]   Fax:  [phone]  Physical Therapy Treatment  Patient Details  Name: Debra Hale MRN: [id number] Date of Birth: 1950/08/17 Referring Provider (PT):  Odis Hollingshead, MD   Encounter Date: 02/22/2020   PT End of Session - 02/22/20 1439    Visit Number 4    Number of Visits 24    Date for PT Re-Evaluation 05/07/20    Authorization Type [employer] reporting period from 02/13/2020    Progress Note Due on Visit 10    PT Start Time 1435    PT Stop Time 1513    PT Time Calculation (min) 38 min    Equipment Utilized During Treatment Gait belt    Activity Tolerance Patient tolerated treatment well    Behavior During Therapy [facility] for tasks assessed/performed           Past Medical History:  Diagnosis Date  . Anxiety   . Arthritis    Osteoarthritis  . Breast cancer ([place]) 2009   right breast lumpectomy with rad tx  . Colon cancer ([place])    surgery with chemo and rad tx  . Complication of anesthesia   . GERD (gastroesophageal reflux disease)   . Heart palpitations   . History of hiatal hernia   . History of kidney stones   . Hypothyroidism   . Liver disease   . Liver nodule    s/p negative biopsy  . Malignant neoplasm of thyroid gland ([place]) 2002   s/p surgery and XRT  . Osteoporosis   . Other and unspecified hyperlipidemia   . Palpitations   . Personal history of chemotherapy   . Personal history of malignant neoplasm of large intestine    carcinoma - cecum, s/p right laparoscopic colectomy - s/p chemotherapy and XRT  . Personal history of radiation therapy   . Pneumonia 2019  . PONV (postoperative nausea and vomiting)   . Pure hypercholesterolemia   . Unspecified hereditary and idiopathic peripheral neuropathy     Past Surgical History:  Procedure Laterality Date  . APPENDECTOMY  1985  . BREAST BIOPSY Right  2009   positive  . BREAST BIOPSY Right 2009   negative  . BREAST LUMPECTOMY Right 2009   breast cancer  . CHOLECYSTECTOMY  1995  . COLONOSCOPY    . COLONOSCOPY WITH PROPOFOL N/A 10/29/2017   Procedure: COLONOSCOPY WITH PROPOFOL;  Surgeon: Manya Silvas, MD;  Location: [facility] ENDOSCOPY;  Service: Endoscopy;  Laterality: N/A;  . DILATION AND CURETTAGE OF UTERUS  1990  . DILATION AND CURETTAGE, DIAGNOSTIC / THERAPEUTIC  1990  . ESOPHAGOGASTRODUODENOSCOPY    . ESOPHAGOGASTRODUODENOSCOPY (EGD) WITH PROPOFOL N/A 10/29/2017   Procedure: ESOPHAGOGASTRODUODENOSCOPY (EGD) WITH PROPOFOL;  Surgeon: Manya Silvas, MD;  Location: [facility] ENDOSCOPY;  Service: Endoscopy;  Laterality: N/A;  . LAPAROSCOPIC PARTIAL COLECTOMY     stage 3-C carcinoma of the cecum, s/p chemotherapy and xrt  . LITHOTRIPSY    . SIGMOIDOSCOPY  08/26/1993  . THYROID LOBECTOMY  2002   s/p XRT  . TOTAL HIP ARTHROPLASTY Right 10/24/2019   Procedure: TOTAL HIP ARTHROPLASTY;  Surgeon: Corky Mull, MD;  Location: [facility] ORS;  Service: Orthopedics;  Laterality: Right;    There were no vitals filed for this visit.   Subjective Assessment - 02/22/20 1437    Subjective Patient reports she has mild pain in bilateral hips near bilateral greater trochanters.  She did not sleep well last night and was up til 3-4am for some unknown reason. She was pretty sore the day after her last PT session but is feeling better now. Says pain was not keeing her up last night. HEP is going well.    Pertinent History Patient is a 69 y.o. female who presents to outpatient physical therapy with a referral for medical diagnosis s/p R THA, R and L trochanteric bursitis. Physician specifically requested IT band stretching. This patient's chief complaints consist of B hip pain following R THA on 10/24/2019 leading to the following functional deficits: difficulty stand up or sit down, walk up and down steps, squat, bend without pain, waling prolonged distances (over 30  min), cleaning, cooking, dressing, picking things up off the ground, sleeping. Relevant past medical history and comorbidities include hx of pneumonia and acute pericarditis (resolved), GERD, hypothyroidism, peripheral neuropathy in hands from chemo, osteoporosis, hx R breast cancer (2009), thyroid cancer (2002),colon cancer (afer thyroid cancer and before breast cancer), sinus headaches, cholecystectomy, appendectomy, kidney and gallstones.  Patient denies hx stroke, seizures, lung problem, major cardiac events, diabetes, unexplained weight loss, changes in bowel or bladder problems recently, new onset stumbling or dropping things.    Limitations Lifting;Standing;Walking;House hold activities   Functional Limitations: stand up or sit down, walk up and down steps, squat, bend without pain, waling prolonged distances (over 30 min), cleaning, cooking, dressing, picking things up off the ground, sleeping.   How long can you sit comfortably? "longer I sit the more it hurts when I get up"    How long can you stand comfortably? 20 min    How long can you walk comfortably? 30 min    Patient Stated Goals "to not have any more pain"    Currently in Pain? Yes    Pain Score 2     Pain Location Hip    Pain Orientation Right;Left;Lateral    Pain Onset More than a month ago           TREATMENT: Denies sensitivity to latex  Therapeutic exercise:to centralize symptoms and improve ROM, strength, muscular endurance, and activity tolerance required for successful completion of functional activities. -NuStep level3using bilateral lower extremities. Seat setting 8. For improved extremity mobility, muscular endurance, and activity tolerance; and to induce the analgesic effect of aerobic exercise, stimulate improved joint nutrition, and prepare body structures and systems for following interventions. x36minutes. Average SPM = 73 - standing ITB stretch at wall 3 reps each side of 30 seconds - sit <> stand from  green chair (18 inches) 3x10with green band around knees with heavy cuing to keep knees neutral or slightly abducted against the band to improve hip strength.  -hooklying glute bridge with green theraband around distal thighs with  3x10  Manual therapy:to reduce pain and tissue tension, improve range of motion, neuromodulation, in order to promote improved ability to complete functional activities. - supine and sidelying STM to lateral thigh with "the stick" assist both sides to decrease hip pain.   HOME EXERCISE PROGRAM Access Code: [email or web address] URL: [email or web address] Date: 02/15/2020 Prepared by: Rosita Kea  Exercises ITB Stretch at Wall - 1 x daily - 3 reps - 30 seconds hold ITB Stretch at [place] - 1 x daily - 3 reps - 30 seconds hold Bridge - 1 x daily - 3 sets - 10 reps - 5 seconds hold    PT Education - 02/22/20 1439    Education Details Exercise purpose/form. Self management techniques.  Person(s) Educated Patient    Methods Explanation;Demonstration;Tactile cues;Verbal cues    Comprehension Verbalized understanding;Returned demonstration;Verbal cues required;Tactile cues required;Need further instruction            PT Short Term Goals - 02/13/20 1754      PT SHORT TERM GOAL #1   Title Be independent with initial home exercise program for self-management of symptoms.    Baseline to be intiated at visit 2 as appropriate (02/13/2020);    Time 3    Period Weeks    Status New    Target Date 03/05/20             PT Long Term Goals - 02/13/20 1753      PT LONG TERM GOAL #1   Title Be independent with a long-term home exercise program for self-management of symptoms.    Baseline to be intiated at visit 2 as appropriate (02/13/2020);    Time 12    Period Weeks    Status New   TARGET DATE FOR ALL LONG TERM GOALS: 05/07/2020     PT LONG TERM GOAL #2   Title Demonstrate improved FOTO score to equal or greater than 65 by visit 16#  to demonstrate  improvement in overall condition and self-reported functional ability.    Baseline 44 (02/13/2020);    Time 12    Period Weeks    Status New      PT LONG TERM GOAL #3   Title Reduce pain with functional activities to equal or less than 1/10 to allow patient to complete usual activities including ADLs, IADLs, and social engagement with less difficulty.    Baseline up to 5/10 (02/13/2020);    Time 12    Period Weeks    Status New      PT LONG TERM GOAL #4   Title Patient will score equal or greater than 25/30 on functional gait assessment to demonstrate low fall risk.    Baseline 21/30, moderate fall risk (02/13/2020);    Time 12    Period Weeks    Status New      PT LONG TERM GOAL #5   Title Complete community, work and/or recreational activities without limitation due to current condition.    Baseline Functional Limitations: stand up or sit down, walk up and down steps, squat, bend without pain, waling prolonged distances (over 30 min), cleaning, cooking, dressing, picking things up off the ground, sleeping (02/13/2020);    Time 12    Period Weeks    Status New                 Plan - 02/22/20 1449    Clinical Impression Statement Patient tolerated treatment with mid discomfort at bilateral hips L>R. Continued to work on strengthening and hip mobility to decrease pain and improve hip control. Patient continues to require extra cuing but participates well. Did not progress exercises due to level of soreness following last session. Patient would benefit from continued management of limiting condition by skilled physical therapist to address remaining impairments and functional limitations to work towards stated goals and return to PLOF or maximal functional independence.    Personal Factors and Comorbidities Age;Comorbidity 3+;Social Background;Past/Current Experience;Fitness;Time since onset of injury/illness/exacerbation;Other   cognition   Comorbidities Relevant past medical  history and comorbidities include hx of pneumonia and acute pericarditis (resolved), GERD, hypothyroidism, peripheral neuropathy in hands from chemo, osteoporosis, hx R breast cancer (2009), thyroid cancer (2002),colon cancer (afer thyroid cancer and before breast cancer), sinus  headaches, cholecystectomy, appendectomy, kidney and gallstones.    Examination-Activity Limitations Bend;Locomotion Level;Lift;Stairs;Squat;Stand;Sleep;Dressing;Transfers;Carry;Hygiene/Grooming    Examination-Participation Restrictions Cleaning;Yard Work;Community Activity;Meal Prep;Shop;Laundry;Other   Functional Limitations: stand up or sit down, walk up and down steps, squat, bend without pain, waling prolonged distances (over 30 min), cleaning, cooking, dressing, picking things up off the ground, sleeping   Stability/Clinical Decision Making Evolving/Moderate complexity    Rehab Potential Good    PT Frequency 2x / week    PT Duration 12 weeks    PT Treatment/Interventions ADLs/Self Care Home Management;Aquatic Therapy;Cryotherapy;Moist Heat;Gait training;Stair training;DME Instruction;Functional mobility training;Balance training;Neuromuscular re-education;Therapeutic exercise;Therapeutic activities;Cognitive remediation;Patient/family education;Manual techniques;Passive range of motion;Dry needling;Scar mobilization;Joint Manipulations;Spinal Manipulations    PT Next Visit Plan progressive glute, LE, and functional strengthening and flexibility exercises    PT Home Exercise Plan Medbridge Access Code: [email or web address]    Consulted and Agree with Plan of Care Patient           Patient will benefit from skilled therapeutic intervention in order to improve the following deficits and impairments:  Abnormal gait, Decreased cognition, Pain, Improper body mechanics, Increased muscle spasms, Decreased mobility, Decreased activity tolerance, Decreased endurance, Decreased range of motion, Decreased strength, Hypomobility, Impaired  perceived functional ability, Impaired flexibility, Difficulty walking, Decreased balance  Visit Diagnosis: Pain in right hip  Pain in left hip  Unsteadiness on feet     Problem List Patient Active Problem List   Diagnosis Date Noted  . Anemia 02/11/2020  . 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 Rectal bleed 06/17/2019  . Pleural effusion 12/03/2018  . Right hip pain 12/03/2018  . Leukopenia 08/21/2018  . Change in vision 06/24/2018  . Hypothyroidism 05/02/2018  . Neck pain 05/02/2018  . Nonintractable headache 04/18/2018  . Thrush 04/18/2018  . Pleural effusion associated with pulmonary infection 04/18/2018  . Other fatigue 04/18/2018  . Abdominal pain 03/06/2016  . Acute diarrhea 01/10/2016  . Neck nodule 10/13/2015  . Vitamin D deficiency 07/27/2015  . Loose stools 04/14/2015  . Mild depression ([place]) 02/02/2015  . Health care maintenance 06/17/2014  . Stress 01/28/2014  . Acute pericarditis 05/31/2013  . Environmental allergies 02/25/2013  . Left elbow pain 02/25/2013  . Abnormal liver function test 12/07/2012  . Anxiety 05/06/2012  . GERD (gastroesophageal reflux disease) 05/06/2012  . Osteoporosis 05/06/2012  . History of breast cancer 09/18/2008  . History of thyroid cancer 09/18/2008  . Hypercholesterolemia 09/18/2008  . Peripheral neuropathy 09/18/2008  . Palpitations 09/18/2008  . History of malignant neoplasm of large intestine 09/18/2008   Everlean Alstrom. Graylon Good, PT, DPT 02/22/20, 2:51 PM  [facility] [address], [place], [address] Phone: [phone]   Fax:  [phone]  Name: DANIKAH BUDZIK MRN: [id number] Date of Birth: [birth year]

## 2020-02-23 ENCOUNTER — Other Ambulatory Visit: Payer: Self-pay | Admitting: Internal Medicine

## 2020-02-23 DIAGNOSIS — D72819 Decreased white blood cell count, unspecified: Secondary | ICD-10-CM

## 2020-02-23 MED ORDER — LEVOTHYROXINE SODIUM 125 MCG PO TABS
125.0000 ug | ORAL_TABLET | Freq: Every day | ORAL | 2 refills | Status: DC
Start: 2020-02-23 — End: 2020-05-23

## 2020-02-23 NOTE — Progress Notes (Signed)
Order placed for hematology referral.  Also sent in rx for synthroid 133mcg q day.

## 2020-02-23 NOTE — Telephone Encounter (Signed)
Pt would like a call back today. She wants to know how low are her white blood cells if she is being referred to a hematologist?

## 2020-02-25 ENCOUNTER — Other Ambulatory Visit: Payer: Self-pay | Admitting: Internal Medicine

## 2020-02-25 DIAGNOSIS — D72819 Decreased white blood cell count, unspecified: Secondary | ICD-10-CM

## 2020-02-25 DIAGNOSIS — E876 Hypokalemia: Secondary | ICD-10-CM

## 2020-02-25 NOTE — Progress Notes (Signed)
Order placed for f/u potassium and hematology referral.

## 2020-02-26 ENCOUNTER — Other Ambulatory Visit: Payer: Self-pay

## 2020-02-26 ENCOUNTER — Ambulatory Visit: Payer: PPO | Admitting: Physical Therapy

## 2020-02-26 ENCOUNTER — Encounter: Payer: Self-pay | Admitting: Physical Therapy

## 2020-02-26 DIAGNOSIS — M25551 Pain in right hip: Secondary | ICD-10-CM | POA: Diagnosis not present

## 2020-02-26 NOTE — Therapy (Signed)
[facility] [address] [place], [address] Phone: [phone]   Fax:  [phone]  Physical Therapy Treatment  Patient Details  Name: Debra Hale MRN: [id number] Date of Birth: 1950-07-23 Referring Provider (PT):  Odis Hollingshead, MD   Encounter Date: 02/26/2020   PT End of Session - 02/26/20 1952    Visit Number 5    Number of Visits 24    Date for PT Re-Evaluation 05/07/20    Authorization Type [employer] reporting period from 02/13/2020    Progress Note Due on Visit 10    PT Start Time 1857    PT Stop Time 1940    PT Time Calculation (min) 43 min    Equipment Utilized During Treatment Gait belt    Activity Tolerance Patient tolerated treatment well    Behavior During Therapy [facility] for tasks assessed/performed           Past Medical History:  Diagnosis Date  . Anxiety   . Arthritis    Osteoarthritis  . Breast cancer ([place]) 2009   right breast lumpectomy with rad tx  . Colon cancer ([place])    surgery with chemo and rad tx  . Complication of anesthesia   . GERD (gastroesophageal reflux disease)   . Heart palpitations   . History of hiatal hernia   . History of kidney stones   . Hypothyroidism   . Liver disease   . Liver nodule    s/p negative biopsy  . Malignant neoplasm of thyroid gland ([place]) 2002   s/p surgery and XRT  . Osteoporosis   . Other and unspecified hyperlipidemia   . Palpitations   . Personal history of chemotherapy   . Personal history of malignant neoplasm of large intestine    carcinoma - cecum, s/p right laparoscopic colectomy - s/p chemotherapy and XRT  . Personal history of radiation therapy   . Pneumonia 2019  . PONV (postoperative nausea and vomiting)   . Pure hypercholesterolemia   . Unspecified hereditary and idiopathic peripheral neuropathy     Past Surgical History:  Procedure Laterality Date  . APPENDECTOMY  1985  . BREAST BIOPSY Right  2009   positive  . BREAST BIOPSY Right 2009   negative  . BREAST LUMPECTOMY Right 2009   breast cancer  . CHOLECYSTECTOMY  1995  . COLONOSCOPY    . COLONOSCOPY WITH PROPOFOL N/A 10/29/2017   Procedure: COLONOSCOPY WITH PROPOFOL;  Surgeon: Manya Silvas, MD;  Location: [facility] ENDOSCOPY;  Service: Endoscopy;  Laterality: N/A;  . DILATION AND CURETTAGE OF UTERUS  1990  . DILATION AND CURETTAGE, DIAGNOSTIC / THERAPEUTIC  1990  . ESOPHAGOGASTRODUODENOSCOPY    . ESOPHAGOGASTRODUODENOSCOPY (EGD) WITH PROPOFOL N/A 10/29/2017   Procedure: ESOPHAGOGASTRODUODENOSCOPY (EGD) WITH PROPOFOL;  Surgeon: Manya Silvas, MD;  Location: [facility] ENDOSCOPY;  Service: Endoscopy;  Laterality: N/A;  . LAPAROSCOPIC PARTIAL COLECTOMY     stage 3-C carcinoma of the cecum, s/p chemotherapy and xrt  . LITHOTRIPSY    . SIGMOIDOSCOPY  08/26/1993  . THYROID LOBECTOMY  2002   s/p XRT  . TOTAL HIP ARTHROPLASTY Right 10/24/2019   Procedure: TOTAL HIP ARTHROPLASTY;  Surgeon: Corky Mull, MD;  Location: [facility] ORS;  Service: Orthopedics;  Laterality: Right;    There were no vitals filed for this visit.   Subjective Assessment - 02/26/20 1902    Subjective Patient reports she is feeling okay but continues to have pain in the  blateral lateral hips when she goes from sit <> stands. She saw Dr. Nicki Reaper (PCP) who referred her to hemotology for several blood cells because her white blood count, potassium, B12, and thyroid were low. Her potassium and synthroid doses were changed. States her white blood count has been steadidily going down. She is worried that something is wrong and she is worried she may need cancer again.    Pertinent History Patient is a 69 y.o. female who presents to outpatient physical therapy with a referral for medical diagnosis s/p R THA, R and L trochanteric bursitis. Physician specifically requested IT band stretching. This patient's chief complaints consist of B hip pain following R THA on 10/24/2019 leading  to the following functional deficits: difficulty stand up or sit down, walk up and down steps, squat, bend without pain, waling prolonged distances (over 30 min), cleaning, cooking, dressing, picking things up off the ground, sleeping. Relevant past medical history and comorbidities include hx of pneumonia and acute pericarditis (resolved), GERD, hypothyroidism, peripheral neuropathy in hands from chemo, osteoporosis, hx R breast cancer (2009), thyroid cancer (2002),colon cancer (afer thyroid cancer and before breast cancer), sinus headaches, cholecystectomy, appendectomy, kidney and gallstones.  Patient denies hx stroke, seizures, lung problem, major cardiac events, diabetes, unexplained weight loss, changes in bowel or bladder problems recently, new onset stumbling or dropping things.    Limitations Lifting;Standing;Walking;House hold activities   Functional Limitations: stand up or sit down, walk up and down steps, squat, bend without pain, waling prolonged distances (over 30 min), cleaning, cooking, dressing, picking things up off the ground, sleeping.   How long can you sit comfortably? "longer I sit the more it hurts when I get up"    How long can you stand comfortably? 20 min    How long can you walk comfortably? 30 min    Patient Stated Goals "to not have any more pain"    Currently in Pain? Yes    Pain Score --   no rating given   Pain Location Hip    Pain Orientation Right;Left;Lateral    Pain Onset More than a month ago            TREATMENT: Denies sensitivity to latex  Therapeutic exercise:to centralize symptoms and improve ROM, strength, muscular endurance, and activity tolerance required for successful completion of functional activities. -NuStep level3using bilateral lower extremities. Seat setting 8. For improved extremity mobility, muscular endurance, and activity tolerance; and to induce the analgesic effect of aerobic exercise, stimulate improved joint nutrition, and  prepare body structures and systems for following interventions. x55minutes. Average SPM = 70 - side stepping with green theraband around ankles and CGA for safety. 2x30 feet each direction.  - monster walks with green theraband around ankles and CGA for safety. 2x30 feet forwards and backwards.  - runner's step up with unilateral UE support, 1x10 each side to 8 inch step. (trial with 6 inch step first but too easy).  - side step up with BUE support, 1x10 each side with foot support, 1x10 without foot support.  - deadlift with 10# DB, 2x10. Improved flatter back with cue to "sit on a tall fence" - single leg half circles on slider with two finger support, 2x10 each side. Cuing to decrease half circle to not cross over as much. Reported discomfort at hips following.   Pt required multimodal cuing for proper technique and to facilitate improved neuromuscular control, strength, range of motion, and functional ability resulting in improved performance and form.  HOME EXERCISE PROGRAM Access Code: [email or web address] URL: [email or web address] Date: 02/15/2020 Prepared by: Rosita Kea  Exercises ITB Stretch at Wall - 1 x daily - 3 reps - 30 seconds hold ITB Stretch at [place] - 1 x daily - 3 reps - 30 seconds hold Bridge - 1 x daily - 3 sets - 10 reps - 5 seconds hold     PT Education - 02/26/20 1952    Education Details Exercise purpose/form. Self management techniques.    Person(s) Educated Patient    Methods Explanation;Demonstration;Tactile cues;Verbal cues    Comprehension Verbalized understanding;Returned demonstration;Verbal cues required;Tactile cues required;Need further instruction            PT Short Term Goals - 02/13/20 1754      PT SHORT TERM GOAL #1   Title Be independent with initial home exercise program for self-management of symptoms.    Baseline to be intiated at visit 2 as appropriate (02/13/2020);    Time 3    Period Weeks    Status New    Target Date  03/05/20             PT Long Term Goals - 02/13/20 1753      PT LONG TERM GOAL #1   Title Be independent with a long-term home exercise program for self-management of symptoms.    Baseline to be intiated at visit 2 as appropriate (02/13/2020);    Time 12    Period Weeks    Status New   TARGET DATE FOR ALL LONG TERM GOALS: 05/07/2020     PT LONG TERM GOAL #2   Title Demonstrate improved FOTO score to equal or greater than 65 by visit 16#  to demonstrate improvement in overall condition and self-reported functional ability.    Baseline 44 (02/13/2020);    Time 12    Period Weeks    Status New      PT LONG TERM GOAL #3   Title Reduce pain with functional activities to equal or less than 1/10 to allow patient to complete usual activities including ADLs, IADLs, and social engagement with less difficulty.    Baseline up to 5/10 (02/13/2020);    Time 12    Period Weeks    Status New      PT LONG TERM GOAL #4   Title Patient will score equal or greater than 25/30 on functional gait assessment to demonstrate low fall risk.    Baseline 21/30, moderate fall risk (02/13/2020);    Time 12    Period Weeks    Status New      PT LONG TERM GOAL #5   Title Complete community, work and/or recreational activities without limitation due to current condition.    Baseline Functional Limitations: stand up or sit down, walk up and down steps, squat, bend without pain, waling prolonged distances (over 30 min), cleaning, cooking, dressing, picking things up off the ground, sleeping (02/13/2020);    Time 12    Period Weeks    Status New                 Plan - 02/26/20 1953    Clinical Impression Statement Patient tolerated treatment well overall but did continue to have pain at the lateral hips, most bothered by half-circle exercise. Focused on progressing functional hip strengthening this session. Patient continues to shuffle feet some when ambulating, increasing fall risk. Patient would  benefit from continued management of limiting condition by skilled physical therapist to address remaining impairments and  functional limitations to work towards stated goals and return to PLOF or maximal functional independence.    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    Examination-Activity Limitations Bend;Locomotion Level;Lift;Stairs;Squat;Stand;Sleep;Dressing;Transfers;Carry;Hygiene/Grooming    Examination-Participation Restrictions Cleaning;Yard Work;Community Activity;Meal Prep;Shop;Laundry;Other   Functional Limitations: stand up or sit down, walk up and down steps, squat, bend without pain, waling prolonged distances (over 30 min), cleaning, cooking, dressing, picking things up off the ground, sleeping   Stability/Clinical Decision Making Evolving/Moderate complexity    Rehab Potential Good    PT Frequency 2x / week    PT Duration 12 weeks    PT Treatment/Interventions ADLs/Self Care Home Management;Aquatic Therapy;Cryotherapy;Moist Heat;Gait training;Stair training;DME Instruction;Functional mobility training;Balance training;Neuromuscular re-education;Therapeutic exercise;Therapeutic activities;Cognitive remediation;Patient/family education;Manual techniques;Passive range of motion;Dry needling;Scar mobilization;Joint Manipulations;Spinal Manipulations    PT Next Visit Plan progressive glute, LE, and functional strengthening and flexibility exercises    PT Home Exercise Plan Medbridge  Access Code: [email or web address]    Consulted and Agree with Plan of Care Patient           Patient will benefit from skilled therapeutic intervention in order to improve the following deficits and impairments:  Abnormal gait, Decreased cognition, Pain, Improper body mechanics, Increased muscle spasms, Decreased mobility, Decreased activity tolerance, Decreased endurance, Decreased range of motion, Decreased strength, Hypomobility, Impaired perceived functional ability, Impaired flexibility, Difficulty walking, Decreased balance  Visit Diagnosis: Pain in right hip  Pain in left hip  Unsteadiness on feet     Problem List Patient Active Problem List   Diagnosis Date Noted  . Anemia 02/11/2020  . 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 Rectal bleed 06/17/2019  . Pleural effusion 12/03/2018  . Right hip pain 12/03/2018  . Leukopenia 08/21/2018  . Change in vision 06/24/2018  . Hypothyroidism 05/02/2018  . Neck pain 05/02/2018  . Nonintractable headache 04/18/2018  . Thrush 04/18/2018  . Pleural effusion associated with pulmonary infection 04/18/2018  . Other fatigue 04/18/2018  . Abdominal pain 03/06/2016  . Acute diarrhea 01/10/2016  . Neck nodule 10/13/2015  . Vitamin D deficiency 07/27/2015  . Loose stools 04/14/2015  . Mild depression ([place]) 02/02/2015  . Health care maintenance 06/17/2014  . Stress 01/28/2014  . Acute pericarditis 05/31/2013  . Environmental allergies 02/25/2013  . Left elbow pain 02/25/2013  . Abnormal liver function test 12/07/2012  . Anxiety 05/06/2012  . GERD (gastroesophageal reflux disease) 05/06/2012  . Osteoporosis 05/06/2012  . History of breast cancer 09/18/2008  . History of thyroid cancer 09/18/2008  . Hypercholesterolemia 09/18/2008  . Peripheral neuropathy 09/18/2008  . Palpitations 09/18/2008  . History of malignant neoplasm of large intestine 09/18/2008    Everlean Alstrom. Graylon Good, PT, DPT 02/26/20, 8:01 PM  [facility] [address], [place], [address] Phone: [phone]   Fax:  [phone]  Name: Debra Hale MRN: [id number] Date of Birth: [birth year]

## 2020-02-27 ENCOUNTER — Ambulatory Visit (INDEPENDENT_AMBULATORY_CARE_PROVIDER_SITE_OTHER): Payer: PPO

## 2020-02-27 DIAGNOSIS — E538 Deficiency of other specified B group vitamins: Secondary | ICD-10-CM

## 2020-02-27 MED ORDER — CYANOCOBALAMIN 1000 MCG/ML IJ SOLN
1000.0000 ug | Freq: Once | INTRAMUSCULAR | Status: AC
  Administered 2020-02-27: 1000 ug via INTRAMUSCULAR

## 2020-02-27 NOTE — Telephone Encounter (Signed)
Called and spoke to [facility] about lab results. Tammy verbalized understanding and had no further questions.

## 2020-02-27 NOTE — Progress Notes (Addendum)
Patient presented for B 12 injection to left deltoid, patient voiced no concerns nor showed any signs of distress during injection.  Reviewed.  Dr Scott 

## 2020-02-29 ENCOUNTER — Other Ambulatory Visit: Payer: Self-pay

## 2020-02-29 ENCOUNTER — Encounter: Payer: Self-pay | Admitting: Physical Therapy

## 2020-02-29 ENCOUNTER — Ambulatory Visit: Payer: PPO | Admitting: Physical Therapy

## 2020-02-29 DIAGNOSIS — M25551 Pain in right hip: Secondary | ICD-10-CM | POA: Diagnosis not present

## 2020-02-29 DIAGNOSIS — R2681 Unsteadiness on feet: Secondary | ICD-10-CM

## 2020-02-29 DIAGNOSIS — M25552 Pain in left hip: Secondary | ICD-10-CM

## 2020-02-29 NOTE — Therapy (Signed)
[facility] [address] [place], [address] Phone: [phone]   Fax:  [phone]  Physical Therapy Treatment  Patient Details  Name: Debra Hale MRN: [id number] Date of Birth: 1950/06/01 Referring Provider (PT):  Odis Hollingshead, MD   Encounter Date: 02/29/2020   PT End of Session - 02/29/20 1440    Visit Number 6    Number of Visits 24    Date for PT Re-Evaluation 05/07/20    Authorization Type [employer] reporting period from 02/13/2020    Progress Note Due on Visit 10    PT Start Time 1330    PT Stop Time 1410    PT Time Calculation (min) 40 min    Equipment Utilized During Treatment Gait belt    Activity Tolerance Patient tolerated treatment well    Behavior During Therapy [facility] for tasks assessed/performed           Past Medical History:  Diagnosis Date  . Anxiety   . Arthritis    Osteoarthritis  . Breast cancer ([place]) 2009   right breast lumpectomy with rad tx  . Colon cancer ([place])    surgery with chemo and rad tx  . Complication of anesthesia   . GERD (gastroesophageal reflux disease)   . Heart palpitations   . History of hiatal hernia   . History of kidney stones   . Hypothyroidism   . Liver disease   . Liver nodule    s/p negative biopsy  . Malignant neoplasm of thyroid gland ([place]) 2002   s/p surgery and XRT  . Osteoporosis   . Other and unspecified hyperlipidemia   . Palpitations   . Personal history of chemotherapy   . Personal history of malignant neoplasm of large intestine    carcinoma - cecum, s/p right laparoscopic colectomy - s/p chemotherapy and XRT  . Personal history of radiation therapy   . Pneumonia 2019  . PONV (postoperative nausea and vomiting)   . Pure hypercholesterolemia   . Unspecified hereditary and idiopathic peripheral neuropathy     Past Surgical History:  Procedure Laterality Date  . APPENDECTOMY  1985  . BREAST BIOPSY Right  2009   positive  . BREAST BIOPSY Right 2009   negative  . BREAST LUMPECTOMY Right 2009   breast cancer  . CHOLECYSTECTOMY  1995  . COLONOSCOPY    . COLONOSCOPY WITH PROPOFOL N/A 10/29/2017   Procedure: COLONOSCOPY WITH PROPOFOL;  Surgeon: Manya Silvas, MD;  Location: [facility] ENDOSCOPY;  Service: Endoscopy;  Laterality: N/A;  . DILATION AND CURETTAGE OF UTERUS  1990  . DILATION AND CURETTAGE, DIAGNOSTIC / THERAPEUTIC  1990  . ESOPHAGOGASTRODUODENOSCOPY    . ESOPHAGOGASTRODUODENOSCOPY (EGD) WITH PROPOFOL N/A 10/29/2017   Procedure: ESOPHAGOGASTRODUODENOSCOPY (EGD) WITH PROPOFOL;  Surgeon: Manya Silvas, MD;  Location: [facility] ENDOSCOPY;  Service: Endoscopy;  Laterality: N/A;  . LAPAROSCOPIC PARTIAL COLECTOMY     stage 3-C carcinoma of the cecum, s/p chemotherapy and xrt  . LITHOTRIPSY    . SIGMOIDOSCOPY  08/26/1993  . THYROID LOBECTOMY  2002   s/p XRT  . TOTAL HIP ARTHROPLASTY Right 10/24/2019   Procedure: TOTAL HIP ARTHROPLASTY;  Surgeon: Corky Mull, MD;  Location: [facility] ORS;  Service: Orthopedics;  Laterality: Right;    There were no vitals filed for this visit.   Subjective Assessment - 02/29/20 1436    Subjective Patient reports that she had a lot of pain in the left hip  and she could barely walk all day. Today she is feeling better. She thinks she felt okay on Tuesday. R side is feeling good. Currently pain is "hardly anything" while sitting upon arrival in the left hip. Rates 3/10 pain there. She had to go somewhere and got a B12 shot.    Pertinent History Patient is a 69 y.o. female who presents to outpatient physical therapy with a referral for medical diagnosis s/p R THA, R and L trochanteric bursitis. Physician specifically requested IT band stretching. This patient's chief complaints consist of B hip pain following R THA on 10/24/2019 leading to the following functional deficits: difficulty stand up or sit down, walk up and down steps, squat, bend without pain, waling prolonged  distances (over 30 min), cleaning, cooking, dressing, picking things up off the ground, sleeping. Relevant past medical history and comorbidities include hx of pneumonia and acute pericarditis (resolved), GERD, hypothyroidism, peripheral neuropathy in hands from chemo, osteoporosis, hx R breast cancer (2009), thyroid cancer (2002),colon cancer (afer thyroid cancer and before breast cancer), sinus headaches, cholecystectomy, appendectomy, kidney and gallstones.  Patient denies hx stroke, seizures, lung problem, major cardiac events, diabetes, unexplained weight loss, changes in bowel or bladder problems recently, new onset stumbling or dropping things.    Limitations Lifting;Standing;Walking;House hold activities   Functional Limitations: stand up or sit down, walk up and down steps, squat, bend without pain, waling prolonged distances (over 30 min), cleaning, cooking, dressing, picking things up off the ground, sleeping.   How long can you sit comfortably? "longer I sit the more it hurts when I get up"    How long can you stand comfortably? 20 min    How long can you walk comfortably? 30 min    Patient Stated Goals "to not have any more pain"    Currently in Pain? Yes    Pain Score 3     Pain Location Hip    Pain Orientation Left;Lateral    Pain Onset More than a month ago           TREATMENT: Denies sensitivity to latex  Therapeutic exercise:to centralize symptoms and improve ROM, strength, muscular endurance, and activity tolerance required for successful completion of functional activities. -NuStep level3using bilateral lower extremities. Seat setting 8. For improved extremity mobility, muscular endurance, and activity tolerance; and to induce the analgesic effect of aerobic exercise, stimulate improved joint nutrition, and prepare body structures and systems for following interventions. x49minutes. Average SPM = 74 - side stepping with green theraband around ankles and CGA for safety.  1x30 feet each direction.  - monster walks with green theraband around ankles and CGA for safety. 2x30 feet forwards and backwards.  - side step up to 8 inch step with BUE support, 3x10 each side without foot support.  - deadlift with 10# DB, 2x10. Improved flatter back with cue to "sit on a tall fence"  Pt required multimodal cuing for proper technique and to facilitate improved neuromuscular control, strength, range of motion, and functional ability resulting in improved performance and form.  HOME EXERCISE PROGRAM Access Code: [email or web address] URL: [email or web address] Date: 02/15/2020 Prepared by: Rosita Kea  Exercises ITB Stretch at Wall - 1 x daily - 3 reps - 30 seconds hold ITB Stretch at Wall - 1 x daily - 3 reps - 30 seconds hold Bridge - 1 x daily - 3 sets - 10 reps - 5 seconds hold .      PT Education - 02/29/20 1440    Education Details  Exercise purpose/form. Self management techniques.    Person(s) Educated Patient    Methods Explanation;Demonstration;Tactile cues;Verbal cues    Comprehension Verbalized understanding;Returned demonstration;Verbal cues required;Tactile cues required;Need further instruction            PT Short Term Goals - 02/13/20 1754      PT SHORT TERM GOAL #1   Title Be independent with initial home exercise program for self-management of symptoms.    Baseline to be intiated at visit 2 as appropriate (02/13/2020);    Time 3    Period Weeks    Status New    Target Date 03/05/20             PT Long Term Goals - 02/13/20 1753      PT LONG TERM GOAL #1   Title Be independent with a long-term home exercise program for self-management of symptoms.    Baseline to be intiated at visit 2 as appropriate (02/13/2020);    Time 12    Period Weeks    Status New   TARGET DATE FOR ALL LONG TERM GOALS: 05/07/2020     PT LONG TERM GOAL #2   Title Demonstrate improved FOTO score to equal or greater than 65 by visit 16#  to demonstrate  improvement in overall condition and self-reported functional ability.    Baseline 44 (02/13/2020);    Time 12    Period Weeks    Status New      PT LONG TERM GOAL #3   Title Reduce pain with functional activities to equal or less than 1/10 to allow patient to complete usual activities including ADLs, IADLs, and social engagement with less difficulty.    Baseline up to 5/10 (02/13/2020);    Time 12    Period Weeks    Status New      PT LONG TERM GOAL #4   Title Patient will score equal or greater than 25/30 on functional gait assessment to demonstrate low fall risk.    Baseline 21/30, moderate fall risk (02/13/2020);    Time 12    Period Weeks    Status New      PT LONG TERM GOAL #5   Title Complete community, work and/or recreational activities without limitation due to current condition.    Baseline Functional Limitations: stand up or sit down, walk up and down steps, squat, bend without pain, waling prolonged distances (over 30 min), cleaning, cooking, dressing, picking things up off the ground, sleeping (02/13/2020);    Time 12    Period Weeks    Status New                 Plan - 02/29/20 1444    Clinical Impression Statement Patient tolerated treatment well with mild discomfort in her hips during band exercises and non painful popping in left hip during deadlift. Reported no increase in pain by end of session. Patient would benefit from continued management of limiting condition by skilled physical therapist to address remaining impairments and functional limitations to work towards stated goals and return to PLOF or maximal functional independence.    Personal Factors and Comorbidities Age;Comorbidity 3+;Social Background;Past/Current Experience;Fitness;Time since onset of injury/illness/exacerbation;Other   cognition   Comorbidities Relevant past medical history and comorbidities include hx of pneumonia and acute pericarditis (resolved), GERD, hypothyroidism, peripheral  neuropathy in hands from chemo, osteoporosis, hx R breast cancer (2009), thyroid cancer (2002),colon cancer (afer thyroid cancer and before breast cancer), sinus headaches, cholecystectomy, appendectomy, kidney and gallstones.  Examination-Activity Limitations Bend;Locomotion Level;Lift;Stairs;Squat;Stand;Sleep;Dressing;Transfers;Carry;Hygiene/Grooming    Examination-Participation Restrictions Cleaning;Yard Work;Community Activity;Meal Prep;Shop;Laundry;Other   Functional Limitations: stand up or sit down, walk up and down steps, squat, bend without pain, waling prolonged distances (over 30 min), cleaning, cooking, dressing, picking things up off the ground, sleeping   Stability/Clinical Decision Making Evolving/Moderate complexity    Rehab Potential Good    PT Frequency 2x / week    PT Duration 12 weeks    PT Treatment/Interventions ADLs/Self Care Home Management;Aquatic Therapy;Cryotherapy;Moist Heat;Gait training;Stair training;DME Instruction;Functional mobility training;Balance training;Neuromuscular re-education;Therapeutic exercise;Therapeutic activities;Cognitive remediation;Patient/family education;Manual techniques;Passive range of motion;Dry needling;Scar mobilization;Joint Manipulations;Spinal Manipulations    PT Next Visit Plan progressive glute, LE, and functional strengthening and flexibility exercises    PT Home Exercise Plan Medbridge Access Code: [email or web address]    Consulted and Agree with Plan of Care Patient           Patient will benefit from skilled therapeutic intervention in order to improve the following deficits and impairments:  Abnormal gait, Decreased cognition, Pain, Improper body mechanics, Increased muscle spasms, Decreased mobility, Decreased activity tolerance, Decreased endurance, Decreased range of motion, Decreased strength, Hypomobility, Impaired perceived functional ability, Impaired flexibility, Difficulty walking, Decreased balance  Visit Diagnosis: Pain in  right hip  Pain in left hip  Unsteadiness on feet     Problem List Patient Active Problem List   Diagnosis Date Noted  . Anemia 02/11/2020  . 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 Rectal bleed 06/17/2019  . Pleural effusion 12/03/2018  . Right hip pain 12/03/2018  . Leukopenia 08/21/2018  . Change in vision 06/24/2018  . Hypothyroidism 05/02/2018  . Neck pain 05/02/2018  . Nonintractable headache 04/18/2018  . Thrush 04/18/2018  . Pleural effusion associated with pulmonary infection 04/18/2018  . Other fatigue 04/18/2018  . Abdominal pain 03/06/2016  . Acute diarrhea 01/10/2016  . Neck nodule 10/13/2015  . Vitamin D deficiency 07/27/2015  . Loose stools 04/14/2015  . Mild depression ([place]) 02/02/2015  . Health care maintenance 06/17/2014  . Stress 01/28/2014  . Acute pericarditis 05/31/2013  . Environmental allergies 02/25/2013  . Left elbow pain 02/25/2013  . Abnormal liver function test 12/07/2012  . Anxiety 05/06/2012  . GERD (gastroesophageal reflux disease) 05/06/2012  . Osteoporosis 05/06/2012  . History of breast cancer 09/18/2008  . History of thyroid cancer 09/18/2008  . Hypercholesterolemia 09/18/2008  . Peripheral neuropathy 09/18/2008  . Palpitations 09/18/2008  . History of malignant neoplasm of large intestine 09/18/2008    Everlean Alstrom. Graylon Good, PT, DPT 02/29/20, 3:26 PM  [facility] [address] [place], [address] Phone: [phone]   Fax:  [phone]  Name: Debra Hale MRN: [id number] Date of Birth: March 16, 1951

## 2020-03-02 NOTE — Progress Notes (Signed)
[name]  [phone] [phone]  ID: Debra Hale OB: December 17, 1950  MR#: [id number]  TDS#:[id number]  Patient Care Team: [name], MD as PCP - General (Internal Medicine) Wellington Hampshire, MD as PCP - Cardiology (Cardiology)  CHIEF COMPLAINT: Leukopenia.  INTERVAL HISTORY: Patient is a 69 year old female who was last seen in clinic in September 2018 for history of breast cancer.  She is referred back for persistent leukopenia.  She is anxious, but otherwise feels well.  She has no neurologic complaints.  She denies any recent fevers or illnesses.  She has a good appetite and denies weight loss.  She has no chest pain, shortness of breath, cough, or hemoptysis.  She denies any nausea, vomiting, constipation, or diarrhea.  She has no urinary complaints.  Patient otherwise feels well and offers no further specific complaints today.  REVIEW OF SYSTEMS:   Review of Systems  Constitutional: Negative.  Negative for fever, malaise/fatigue and weight loss.  Respiratory: Negative.  Negative for cough, hemoptysis and shortness of breath.   Cardiovascular: Negative.  Negative for chest pain and leg swelling.  Gastrointestinal: Negative.  Negative for abdominal pain.  Genitourinary: Negative.  Negative for dysuria.  Musculoskeletal: Negative.  Negative for back pain.  Skin: Negative.  Negative for rash.  Neurological: Negative.  Negative for dizziness, focal weakness, weakness and headaches.  Psychiatric/Behavioral: The patient is nervous/anxious.     As per HPI. Otherwise, a complete review of systems is negative.  PAST MEDICAL HISTORY: Past Medical History:  Diagnosis Date  . Anxiety   . Arthritis    Osteoarthritis  . Breast cancer ([place]) 2009   right breast lumpectomy with rad tx  . Colon cancer ([place])    surgery with chemo and rad tx  . Complication of anesthesia   . GERD (gastroesophageal reflux disease)   . Heart palpitations   .  History of hiatal hernia   . History of kidney stones   . Hypothyroidism   . Liver disease   . Liver nodule    s/p negative biopsy  . Malignant neoplasm of thyroid gland ([place]) 2002   s/p surgery and XRT  . Osteoporosis   . Other and unspecified hyperlipidemia   . Palpitations   . Personal history of chemotherapy   . Personal history of malignant neoplasm of large intestine    carcinoma - cecum, s/p right laparoscopic colectomy - s/p chemotherapy and XRT  . Personal history of radiation therapy   . Pneumonia 2019  . PONV (postoperative nausea and vomiting)   . Pure hypercholesterolemia   . Unspecified hereditary and idiopathic peripheral neuropathy     PAST SURGICAL HISTORY: Past Surgical History:  Procedure Laterality Date  . APPENDECTOMY  1985  . BREAST BIOPSY Right 2009   positive  . BREAST BIOPSY Right 2009   negative  . BREAST LUMPECTOMY Right 2009   breast cancer  . CHOLECYSTECTOMY  1995  . COLONOSCOPY    . COLONOSCOPY WITH PROPOFOL N/A 10/29/2017   Procedure: COLONOSCOPY WITH PROPOFOL;  Surgeon: Manya Silvas, MD;  Location: [facility] ENDOSCOPY;  Service: Endoscopy;  Laterality: N/A;  . DILATION AND CURETTAGE OF UTERUS  1990  . DILATION AND CURETTAGE, DIAGNOSTIC / THERAPEUTIC  1990  . ESOPHAGOGASTRODUODENOSCOPY    . ESOPHAGOGASTRODUODENOSCOPY (EGD) WITH PROPOFOL N/A 10/29/2017   Procedure: ESOPHAGOGASTRODUODENOSCOPY (EGD) WITH PROPOFOL;  Surgeon: Manya Silvas, MD;  Location[facility] ENDOSCOPY;  Service: Endoscopy;  Laterality: N/A;  . LAPAROSCOPIC PARTIAL COLECTOMY  stage 3-C carcinoma of the cecum, s/p chemotherapy and xrt  . LITHOTRIPSY    . SIGMOIDOSCOPY  08/26/1993  . THYROID LOBECTOMY  2002   s/p XRT  . TOTAL HIP ARTHROPLASTY Right 10/24/2019   Procedure: TOTAL HIP ARTHROPLASTY;  Surgeon: Corky Mull, MD;  Location: [facility] ORS;  Service: Orthopedics;  Laterality: Right;    FAMILY HISTORY: Family History  Problem Relation Age of Onset  . Stroke Mother         46s  . Alzheimer's disease Mother   . Cancer Mother   . Lung cancer Father   . Prostate cancer Father   . Cancer Father        Colon  . Colon cancer Father   . Breast cancer Sister        75's  . Lung cancer Sister   . Breast cancer Maternal Aunt     ADVANCED DIRECTIVES (Y/N):  N  HEALTH MAINTENANCE: Social History   Tobacco Use  . Smoking status: Never Smoker  . Smokeless tobacco: Never Used  Vaping Use  . Vaping Use: Never used  Substance Use Topics  . Alcohol use: No    Alcohol/week: 0.0 standard drinks  . Drug use: No     Colonoscopy:  PAP:  Bone density:  Lipid panel:  Allergies  Allergen Reactions  . Demeclocycline Other (See Comments)    Throat swells  . Tetracyclines & Related Other (See Comments)    Throat swells   . Augmentin [Amoxicillin-Pot Clavulanate] Diarrhea  . Bentyl [Dicyclomine Hcl]     unkn  . Ciprofloxacin Diarrhea  . Codeine Other (See Comments)    dizziness    . Epinephrine     Speeds heart up - panic   . Flagyl [Metronidazole] Nausea And Vomiting  . Librax [Chlordiazepoxide-Clidinium]     unkn  . Phenobarbital     unkn  . Prednisone     Nervous    . Ultram [Tramadol] Other (See Comments)    Sick feeling    Current Outpatient Medications  Medication Sig Dispense Refill  . Azelastine-Fluticasone (DYMISTA) 137-50 MCG/ACT SUSP 2 sprays each nostril q day 23 g 2  . clonazePAM (KLONOPIN) 0.5 MG tablet Take 1.5 mg by mouth 2 (two) times daily.   0  . colestipol (COLESTID) 1 g tablet Take 1 g by mouth every morning. TAKES FOR DIARRHEA    . escitalopram (LEXAPRO) 20 MG tablet Take 20 mg by mouth every morning.     Marland Kitchen ibuprofen (ADVIL) 200 MG tablet Take 200 mg by mouth every 8 (eight) hours as needed for moderate pain.    Marland Kitchen levothyroxine (SYNTHROID) 125 MCG tablet Take 1 tablet (125 mcg total) by mouth daily. 30 tablet 2  . magnesium oxide (MAG-OX) 400 MG tablet TAKE 1 TABLET BY MOUTH EVERY DAY 90 tablet 1  . pantoprazole  (PROTONIX) 40 MG tablet TAKE 1 TABLET BY MOUTH EVERY DAY (Patient taking differently: Take 40 mg by mouth every morning. ) 90 tablet 2  . potassium chloride (KLOR-CON) 10 MEQ tablet TAKE 1 TABLET BY MOUTH EVERY DAY 90 tablet 1  . ibuprofen (ADVIL,MOTRIN) 600 MG tablet Take 1 tablet (600 mg total) by mouth every 6 (six) hours as needed. (Patient not taking: Reported on 10/11/2019) 30 tablet 0   Current Facility-Administered Medications  Medication Dose Route Frequency Provider Last Rate Last Admin  . ondansetron (ZOFRAN) tablet 8 mg  8 mg Oral Once Guse, Jacquelynn Cree, FNP  OBJECTIVE: Vitals:   03/07/20 1346  BP: 128/74  Pulse: 90  Resp: 20  Temp: 98 F (36.7 C)  SpO2: 100%     Body mass index is 25.23 kg/m.    ECOG FS:0 - Asymptomatic  General: Well-developed, well-nourished, no acute distress. Eyes: Pink conjunctiva, anicteric sclera. HEENT: Normocephalic, moist mucous membranes. Lungs: No audible wheezing or coughing. Heart: Regular rate and rhythm. Abdomen: Soft, nontender, no obvious distention. Musculoskeletal: No edema, cyanosis, or clubbing. Neuro: Alert, answering all questions appropriately. Cranial nerves grossly intact. Skin: No rashes or petechiae noted. Psych: Normal affect. Lymphatics: No cervical, calvicular, axillary or inguinal LAD.   LAB RESULTS:  Lab Results  Component Value Date   NA 143 02/20/2020   K 3.4 (L) 03/05/2020   CL 105 02/20/2020   CO2 29 02/20/2020   GLUCOSE 98 02/20/2020   BUN 13 02/20/2020   CREATININE 0.98 02/20/2020   CALCIUM 8.9 02/20/2020   PROT 7.1 02/20/2020   ALBUMIN 3.9 02/20/2020   AST 12 02/20/2020   ALT 20 02/20/2020   ALKPHOS 111 02/20/2020   BILITOT 0.7 02/20/2020   GFRNONAA 47 (L) 10/26/2019   GFRAA 54 (L) 10/26/2019    Lab Results  Component Value Date   WBC 1.7 (L) 03/07/2020   NEUTROABS 0.9 (L) 03/07/2020   HGB 12.9 03/07/2020   HCT 38.6 03/07/2020   MCV 81.4 03/07/2020   PLT 186 03/07/2020      STUDIES: MM 3D SCREEN BREAST BILATERAL  Result Date: 03/07/2020 CLINICAL DATA:  Screening. EXAM: DIGITAL SCREENING BILATERAL MAMMOGRAM WITH TOMO AND CAD COMPARISON:  Previous exam(s). ACR Breast Density Category c: The breast tissue is heterogeneously dense, which may obscure small masses. FINDINGS: There are no findings suspicious for malignancy. Images were processed with CAD. IMPRESSION: No mammographic evidence of malignancy. A result letter of this screening mammogram will be mailed directly to the patient. RECOMMENDATION: Screening mammogram in one year. (Code:SM-B-01Y) BI-RADS CATEGORY  1: Negative. Electronically Signed   By: Lajean Manes M.D.   On: 03/07/2020 09:31    ASSESSMENT: Leukopenia.  PLAN:    1. Leukopenia: Patient's most recent white blood cell count has declined slightly and is now 1.7.  Iron stores, B12, and folate are all within normal limits.  Antineutrophil antibodies and peripheral blood flow cytometry are pending at time of dictation.  No intervention is needed at this time.  Patient may require bone marrow biopsy in the future, but this is not necessary currently.  She will have a video assisted telemedicine visit in 3 weeks to discuss her results. 2. 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 Patient completed 5 years of hormonal therapy in approximately June 2015. Currently, she has no evidence of disease.  Her most recent mammogram on March 05, 2020 was reported as BI-RADS 1.  Repeat in 2022.  I spent a total of 45 minutes reviewing chart data, face-to-face evaluation with the patient, counseling and coordination of care as detailed above.   Patient expressed understanding and was in agreement with this plan. She also understands that She can call clinic at any time with any questions, concerns,  or complaints.   Cancer Staging History of breast cancer Staging form: Breast, AJCC 7th Edition - Clinical stage from 01/07/2016: Stage IA (T1c, N0, M0) - Signed by Lloyd Huger, MD on 01/07/2016   Lloyd Huger, MD  03/08/2020 3:51 PM

## 2020-03-04 ENCOUNTER — Encounter: Payer: Self-pay | Admitting: Physical Therapy

## 2020-03-04 ENCOUNTER — Other Ambulatory Visit: Payer: Self-pay

## 2020-03-04 ENCOUNTER — Ambulatory Visit: Payer: PPO | Attending: Surgery | Admitting: Physical Therapy

## 2020-03-04 DIAGNOSIS — M25551 Pain in right hip: Secondary | ICD-10-CM | POA: Insufficient documentation

## 2020-03-04 DIAGNOSIS — M25552 Pain in left hip: Secondary | ICD-10-CM | POA: Insufficient documentation

## 2020-03-04 DIAGNOSIS — R2681 Unsteadiness on feet: Secondary | ICD-10-CM | POA: Diagnosis not present

## 2020-03-04 NOTE — Therapy (Signed)
[facility] [address] [place], [address] Phone: [phone]   Fax:  [phone]  Physical Therapy Treatment  Patient Details  Name: Debra Hale MRN: [id number] Date of Birth: [birth year] Referring Provider (PT):  Odis Hollingshead, MD   Encounter Date: 03/04/2020   PT End of Session - 03/04/20 1529    Visit Number 7    Number of Visits 24    Date for PT Re-Evaluation 05/07/20    Authorization Type [employer] reporting period from 02/13/2020    Progress Note Due on Visit 10    PT Start Time 1520    PT Stop Time 1600    PT Time Calculation (min) 40 min    Equipment Utilized During Treatment Gait belt    Activity Tolerance Patient tolerated treatment well    Behavior During Therapy [facility] for tasks assessed/performed           Past Medical History:  Diagnosis Date  . Anxiety   . Arthritis    Osteoarthritis  . Breast cancer ([place]) 2009   right breast lumpectomy with rad tx  . Colon cancer ([place])    surgery with chemo and rad tx  . Complication of anesthesia   . GERD (gastroesophageal reflux disease)   . Heart palpitations   . History of hiatal hernia   . History of kidney stones   . Hypothyroidism   . Liver disease   . Liver nodule    s/p negative biopsy  . Malignant neoplasm of thyroid gland ([place]) 2002   s/p surgery and XRT  . Osteoporosis   . Other and unspecified hyperlipidemia   . Palpitations   . Personal history of chemotherapy   . Personal history of malignant neoplasm of large intestine    carcinoma - cecum, s/p right laparoscopic colectomy - s/p chemotherapy and XRT  . Personal history of radiation therapy   . Pneumonia 2019  . PONV (postoperative nausea and vomiting)   . Pure hypercholesterolemia   . Unspecified hereditary and idiopathic peripheral neuropathy     Past Surgical History:  Procedure Laterality Date  . APPENDECTOMY  1985  . BREAST BIOPSY Right  2009   positive  . BREAST BIOPSY Right 2009   negative  . BREAST LUMPECTOMY Right 2009   breast cancer  . CHOLECYSTECTOMY  1995  . COLONOSCOPY    . COLONOSCOPY WITH PROPOFOL N/A 10/29/2017   Procedure: COLONOSCOPY WITH PROPOFOL;  Surgeon: Manya Silvas, MD;  Location: [facility] ENDOSCOPY;  Service: Endoscopy;  Laterality: N/A;  . DILATION AND CURETTAGE OF UTERUS  1990  . DILATION AND CURETTAGE, DIAGNOSTIC / THERAPEUTIC  1990  . ESOPHAGOGASTRODUODENOSCOPY    . ESOPHAGOGASTRODUODENOSCOPY (EGD) WITH PROPOFOL N/A 10/29/2017   Procedure: ESOPHAGOGASTRODUODENOSCOPY (EGD) WITH PROPOFOL;  Surgeon: Manya Silvas, MD;  Location: [facility] ENDOSCOPY;  Service: Endoscopy;  Laterality: N/A;  . LAPAROSCOPIC PARTIAL COLECTOMY     stage 3-C carcinoma of the cecum, s/p chemotherapy and xrt  . LITHOTRIPSY    . SIGMOIDOSCOPY  08/26/1993  . THYROID LOBECTOMY  2002   s/p XRT  . TOTAL HIP ARTHROPLASTY Right 10/24/2019   Procedure: TOTAL HIP ARTHROPLASTY;  Surgeon: Corky Mull, MD;  Location: [facility] ORS;  Service: Orthopedics;  Laterality: Right;    There were no vitals filed for this visit.   Subjective Assessment - 03/04/20 1525    Subjective Patient reports she felt pretty good this weekend with minimal pain. She states  the pain in her R hip has mostly gone away and the left hip pain has moved into the groin area more than the lateral hip. She tried to run a bit yesterdy and that didn't go well. She reports she has no pain at rest upon arrival but it hurts up to 3/10 when she gets up. Also continues to have popping in the left hip. when she almost ran she felt really awkward and didn't fall. Didn't feel good.    Pertinent History Patient is a 69 y.o. female who presents to outpatient physical therapy with a referral for medical diagnosis s/p R THA, R and L trochanteric bursitis. Physician specifically requested IT band stretching. This patient's chief complaints consist of B hip pain following R THA on 10/24/2019  leading to the following functional deficits: difficulty stand up or sit down, walk up and down steps, squat, bend without pain, waling prolonged distances (over 30 min), cleaning, cooking, dressing, picking things up off the ground, sleeping. Relevant past medical history and comorbidities include hx of pneumonia and acute pericarditis (resolved), GERD, hypothyroidism, peripheral neuropathy in hands from chemo, osteoporosis, hx R breast cancer (2009), thyroid cancer (2002),colon cancer (afer thyroid cancer and before breast cancer), sinus headaches, cholecystectomy, appendectomy, kidney and gallstones.  Patient denies hx stroke, seizures, lung problem, major cardiac events, diabetes, unexplained weight loss, changes in bowel or bladder problems recently, new onset stumbling or dropping things.    Limitations Lifting;Standing;Walking;House hold activities   Functional Limitations: stand up or sit down, walk up and down steps, squat, bend without pain, waling prolonged distances (over 30 min), cleaning, cooking, dressing, picking things up off the ground, sleeping.   How long can you sit comfortably? "longer I sit the more it hurts when I get up"    How long can you stand comfortably? 20 min    How long can you walk comfortably? 30 min    Patient Stated Goals "to not have any more pain"    Currently in Pain? Yes    Pain Score 3    when moving or standing up   Pain Location Hip    Pain Orientation Left   groin   Pain Onset More than a month ago           TREATMENT: Denies sensitivity to latex  Therapeutic exercise:to centralize symptoms and improve ROM, strength, muscular endurance, and activity tolerance required for successful completion of functional activities. -NuStep level4using bilateral lower extremities. Seat setting 8. For improved extremity mobility, muscular endurance, and activity tolerance; and to induce the analgesic effect of aerobic exercise, stimulate improved joint  nutrition, and prepare body structures and systems for following interventions. x39minutes. Average SPM =69 - side stepping with green theraband around ankles and CGA for safety. 2x30 feet each direction.  - monster walks with green theraband around ankles and CGA for safety. 2x30 feet forwards and backwards.  - side step up to 9 inch step with BUE support, 3x10 each side without foot support.  - deadlift with 10# DB, 2x10. Improved flatter back with cue to "sit on a tall fence"  Pt required multimodal cuing for proper technique and to facilitate improved neuromuscular control, strength, range of motion, and functional ability resulting in improved performance and form.  HOME EXERCISE PROGRAM Access Code: [email or web address] URL: [email or web address] Date: 02/15/2020 Prepared by: Rosita Kea  Exercises ITB Stretch at Wall - 1 x daily - 3 reps - 30 seconds hold ITB Stretch at [place] - 1 x daily -  3 reps - 30 seconds hold Bridge - 1 x daily - 3 sets - 10 reps - 5 seconds hold .   PT Education - 03/04/20 1529    Education Details Exercise purpose/form. Self management techniques.    Person(s) Educated Patient    Methods Explanation;Demonstration;Tactile cues;Verbal cues    Comprehension Verbalized understanding;Returned demonstration;Verbal cues required;Tactile cues required;Need further instruction            PT Short Term Goals - 02/13/20 1754      PT SHORT TERM GOAL #1   Title Be independent with initial home exercise program for self-management of symptoms.    Baseline to be intiated at visit 2 as appropriate (02/13/2020);    Time 3    Period Weeks    Status New    Target Date 03/05/20             PT Long Term Goals - 02/13/20 1753      PT LONG TERM GOAL #1   Title Be independent with a long-term home exercise program for self-management of symptoms.    Baseline to be intiated at visit 2 as appropriate (02/13/2020);    Time 12    Period Weeks    Status  New   TARGET DATE FOR ALL LONG TERM GOALS: 05/07/2020     PT LONG TERM GOAL #2   Title Demonstrate improved FOTO score to equal or greater than 65 by visit 16#  to demonstrate improvement in overall condition and self-reported functional ability.    Baseline 44 (02/13/2020);    Time 12    Period Weeks    Status New      PT LONG TERM GOAL #3   Title Reduce pain with functional activities to equal or less than 1/10 to allow patient to complete usual activities including ADLs, IADLs, and social engagement with less difficulty.    Baseline up to 5/10 (02/13/2020);    Time 12    Period Weeks    Status New      PT LONG TERM GOAL #4   Title Patient will score equal or greater than 25/30 on functional gait assessment to demonstrate low fall risk.    Baseline 21/30, moderate fall risk (02/13/2020);    Time 12    Period Weeks    Status New      PT LONG TERM GOAL #5   Title Complete community, work and/or recreational activities without limitation due to current condition.    Baseline Functional Limitations: stand up or sit down, walk up and down steps, squat, bend without pain, waling prolonged distances (over 30 min), cleaning, cooking, dressing, picking things up off the ground, sleeping (02/13/2020);    Time 12    Period Weeks    Status New                 Plan - 03/04/20 1603    Clinical Impression Statement Patient tolerated treatment well today with no complaint of pain. Continues to complain of periods of pain at home and does continue to walk with some shuffling in her gait that increases risks for falls. Seems to be improving in overall pain and activity tolerance. Patient would benefit from continued management of limiting condition by skilled physical therapist to address remaining impairments and functional limitations to work towards stated goals and return to PLOF or maximal functional independence.    Personal Factors and Comorbidities Age;Comorbidity 3+;Social  Background;Past/Current Experience;Fitness;Time since onset of injury/illness/exacerbation;Other   cognition  Comorbidities Relevant past medical history and comorbidities include hx of pneumonia and acute pericarditis (resolved), GERD, hypothyroidism, peripheral neuropathy in hands from chemo, osteoporosis, hx R breast cancer (2009), thyroid cancer (2002),colon cancer (afer thyroid cancer and before breast cancer), sinus headaches, cholecystectomy, appendectomy, kidney and gallstones.    Examination-Activity Limitations Bend;Locomotion Level;Lift;Stairs;Squat;Stand;Sleep;Dressing;Transfers;Carry;Hygiene/Grooming    Examination-Participation Restrictions Cleaning;Yard Work;Community Activity;Meal Prep;Shop;Laundry;Other   Functional Limitations: stand up or sit down, walk up and down steps, squat, bend without pain, waling prolonged distances (over 30 min), cleaning, cooking, dressing, picking things up off the ground, sleeping   Stability/Clinical Decision Making Evolving/Moderate complexity    Rehab Potential Good    PT Frequency 2x / week    PT Duration 12 weeks    PT Treatment/Interventions ADLs/Self Care Home Management;Aquatic Therapy;Cryotherapy;Moist Heat;Gait training;Stair training;DME Instruction;Functional mobility training;Balance training;Neuromuscular re-education;Therapeutic exercise;Therapeutic activities;Cognitive remediation;Patient/family education;Manual techniques;Passive range of motion;Dry needling;Scar mobilization;Joint Manipulations;Spinal Manipulations    PT Next Visit Plan progressive glute, LE, and functional strengthening and flexibility exercises    PT Home Exercise Plan Medbridge Access Code: [email or web address]    Consulted and Agree with Plan of Care Patient           Patient will benefit from skilled therapeutic intervention in order to improve the following deficits and impairments:  Abnormal gait, Decreased cognition, Pain, Improper body mechanics, Increased muscle  spasms, Decreased mobility, Decreased activity tolerance, Decreased endurance, Decreased range of motion, Decreased strength, Hypomobility, Impaired perceived functional ability, Impaired flexibility, Difficulty walking, Decreased balance  Visit Diagnosis: Pain in right hip  Pain in left hip  Unsteadiness on feet     Problem List Patient Active Problem List   Diagnosis Date Noted  . Anemia 02/11/2020  . 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 Rectal bleed 06/17/2019  . Pleural effusion 12/03/2018  . Right hip pain 12/03/2018  . Leukopenia 08/21/2018  . Change in vision 06/24/2018  . Hypothyroidism 05/02/2018  . Neck pain 05/02/2018  . Nonintractable headache 04/18/2018  . Thrush 04/18/2018  . Pleural effusion associated with pulmonary infection 04/18/2018  . Other fatigue 04/18/2018  . Abdominal pain 03/06/2016  . Acute diarrhea 01/10/2016  . Neck nodule 10/13/2015  . Vitamin D deficiency 07/27/2015  . Loose stools 04/14/2015  . Mild depression ([place]) 02/02/2015  . Health care maintenance 06/17/2014  . Stress 01/28/2014  . Acute pericarditis 05/31/2013  . Environmental allergies 02/25/2013  . Left elbow pain 02/25/2013  . Abnormal liver function test 12/07/2012  . Anxiety 05/06/2012  . GERD (gastroesophageal reflux disease) 05/06/2012  . Osteoporosis 05/06/2012  . History of breast cancer 09/18/2008  . History of thyroid cancer 09/18/2008  . Hypercholesterolemia 09/18/2008  . Peripheral neuropathy 09/18/2008  . Palpitations 09/18/2008  . History of malignant neoplasm of large intestine 09/18/2008    Everlean Alstrom. Graylon Good, PT, DPT 03/04/20, 4:04 PM  [facility] [address] [place], [address] Phone: [phone]   Fax:  [phone]  Name: KAMARYN GRIMLEY MRN: [id number] Date of Birth: 03/09/1951

## 2020-03-05 ENCOUNTER — Other Ambulatory Visit: Payer: PPO

## 2020-03-05 ENCOUNTER — Other Ambulatory Visit: Payer: Self-pay | Admitting: Internal Medicine

## 2020-03-05 ENCOUNTER — Ambulatory Visit (INDEPENDENT_AMBULATORY_CARE_PROVIDER_SITE_OTHER): Payer: PPO

## 2020-03-05 ENCOUNTER — Other Ambulatory Visit (INDEPENDENT_AMBULATORY_CARE_PROVIDER_SITE_OTHER): Payer: PPO

## 2020-03-05 ENCOUNTER — Ambulatory Visit
Admission: RE | Admit: 2020-03-05 | Discharge: 2020-03-05 | Disposition: A | Discharge status: Home / Self Care | Payer: PPO | Source: Ambulatory Visit | Attending: Internal Medicine | Admitting: Internal Medicine

## 2020-03-05 DIAGNOSIS — Z1231 Encounter for screening mammogram for malignant neoplasm of breast: Secondary | ICD-10-CM | POA: Diagnosis not present

## 2020-03-05 DIAGNOSIS — E876 Hypokalemia: Secondary | ICD-10-CM

## 2020-03-05 DIAGNOSIS — E538 Deficiency of other specified B group vitamins: Secondary | ICD-10-CM

## 2020-03-05 LAB — POTASSIUM: Potassium: 3.4 mEq/L — ABNORMAL LOW (ref 3.5–5.1)

## 2020-03-05 MED ORDER — CYANOCOBALAMIN 1000 MCG/ML IJ SOLN
1000.0000 ug | Freq: Once | INTRAMUSCULAR | Status: AC
  Administered 2020-03-05: 1000 ug via INTRAMUSCULAR

## 2020-03-05 NOTE — Progress Notes (Addendum)
Patient presented for B 12 injection to right deltoid, patient voiced no concerns nor showed any signs of distress during injection.  Reviewed.  Dr Scott 

## 2020-03-05 NOTE — Progress Notes (Signed)
Order placed for f/u potassium.  

## 2020-03-06 ENCOUNTER — Encounter: Payer: Self-pay | Admitting: Oncology

## 2020-03-06 ENCOUNTER — Other Ambulatory Visit: Payer: Self-pay

## 2020-03-06 ENCOUNTER — Ambulatory Visit: Payer: PPO | Admitting: Physical Therapy

## 2020-03-06 ENCOUNTER — Encounter: Payer: Self-pay | Admitting: Physical Therapy

## 2020-03-06 DIAGNOSIS — M25551 Pain in right hip: Secondary | ICD-10-CM | POA: Diagnosis not present

## 2020-03-06 DIAGNOSIS — R2681 Unsteadiness on feet: Secondary | ICD-10-CM

## 2020-03-06 DIAGNOSIS — M25552 Pain in left hip: Secondary | ICD-10-CM

## 2020-03-06 NOTE — Therapy (Signed)
[facility] [address], [place], [address] Phone: [phone]   Fax:  [phone]  Physical Therapy Treatment  Patient Details  Name: DEDRA MATSUO MRN: [id number] Date of Birth: 1950-12-06 Referring Provider (PT):  Odis Hollingshead, MD   Encounter Date: 03/06/2020   PT End of Session - 03/06/20 1610    Visit Number 8    Number of Visits 24    Date for PT Re-Evaluation 05/07/20    Authorization Type [employer] reporting period from 02/13/2020    Progress Note Due on Visit 10    PT Start Time 1603    PT Stop Time 1643    PT Time Calculation (min) 40 min    Equipment Utilized During Treatment Gait belt    Activity Tolerance Patient tolerated treatment well    Behavior During Therapy [facility] for tasks assessed/performed           Past Medical History:  Diagnosis Date  . Anxiety   . Arthritis    Osteoarthritis  . Breast cancer ([place]) 2009   right breast lumpectomy with rad tx  . Colon cancer ([place])    surgery with chemo and rad tx  . Complication of anesthesia   . GERD (gastroesophageal reflux disease)   . Heart palpitations   . History of hiatal hernia   . History of kidney stones   . Hypothyroidism   . Liver disease   . Liver nodule    s/p negative biopsy  . Malignant neoplasm of thyroid gland ([place]) 2002   s/p surgery and XRT  . Osteoporosis   . Other and unspecified hyperlipidemia   . Palpitations   . Personal history of chemotherapy   . Personal history of malignant neoplasm of large intestine    carcinoma - cecum, s/p right laparoscopic colectomy - s/p chemotherapy and XRT  . Personal history of radiation therapy   . Pneumonia 2019  . PONV (postoperative nausea and vomiting)   . Pure hypercholesterolemia   . Unspecified hereditary and idiopathic peripheral neuropathy     Past Surgical History:  Procedure Laterality Date  . APPENDECTOMY  1985  . BREAST BIOPSY Right  2009   positive  . BREAST BIOPSY Right 2009   negative  . BREAST LUMPECTOMY Right 2009   breast cancer  . CHOLECYSTECTOMY  1995  . COLONOSCOPY    . COLONOSCOPY WITH PROPOFOL N/A 10/29/2017   Procedure: COLONOSCOPY WITH PROPOFOL;  Surgeon: Manya Silvas, MD;  Location: [facility] END[facility]COPY;  Service: Endoscopy;  Laterality: N/A;  . DILATION AND CURETTAGE OF UTERUS  1990  . DILATION AND CURETTAGE, DIAGNOSTIC / THERAPEUTIC  1990  . ESOPHAGOGASTRODUODENOSCOPY    . ESOPHAGOGASTRODUODENOSCOPY (EGD) WITH PROPOFOL N/A 10/29/2017   Procedure: ESOPHAGOGASTRODUODENOSCOPY (EGD) WITH PROPOFOL;  Surgeon: Manya Silvas, MD;  Location[facility] ENDOSCOPY;  Service: Endoscopy;  Laterality: N/A;  . LAPAROSCOPIC PARTIAL COLECTOMY     stage 3-C carcinoma of the cecum, s/p chemotherapy and xrt  . LITHOTRIPSY    . SIGMOIDOSCOPY  08/26/1993  . THYROID LOBECTOMY  2002   s/p XRT  . TOTAL HIP ARTHROPLASTY Right 10/24/2019   Procedure: TOTAL HIP ARTHROPLASTY;  Surgeon: Corky Mull, MD;  Location: [facility] ORS;  Service: Orthopedics;  Laterality: Right;    There were no vitals filed for this visit.   Subjective Assessment - 03/06/20 1607    Subjective Patient reports she is sore in both lateral hips today, L > R,  after walking a lot today at 3 different stores. Rates her pain up to 3/10. States she was feeling pretty good at and following last PT session.    Pertinent History Patient is a 69 y.o. female who presents to outpatient physical therapy with a referral for medical diagnosis s/p R THA, R and L trochanteric bursitis. Physician specifically requested IT band stretching. This patient's chief complaints consist of B hip pain following R THA on 10/24/2019 leading to the following functional deficits: difficulty stand up or sit down, walk up and down steps, squat, bend without pain, waling prolonged distances (over 30 min), cleaning, cooking, dressing, picking things up off the ground, sleeping. Relevant past medical  history and comorbidities include hx of pneumonia and acute pericarditis (resolved), GERD, hypothyroidism, peripheral neuropathy in hands from chemo, osteoporosis, hx R breast cancer (2009), thyroid cancer (2002),colon cancer (afer thyroid cancer and before breast cancer), sinus headaches, cholecystectomy, appendectomy, kidney and gallstones.  Patient denies hx stroke, seizures, lung problem, major cardiac events, diabetes, unexplained weight loss, changes in bowel or bladder problems recently, new onset stumbling or dropping things.    Limitations Lifting;Standing;Walking;House hold activities   Functional Limitations: stand up or sit down, walk up and down steps, squat, bend without pain, waling prolonged distances (over 30 min), cleaning, cooking, dressing, picking things up off the ground, sleeping.   How long can you sit comfortably? "longer I sit the more it hurts when I get up"    How long can you stand comfortably? 20 min    How long can you walk comfortably? 30 min    Patient Stated Goals "to not have any more pain"    Currently in Pain? Yes    Pain Score 3     Pain Onset More than a month ago            TREATMENT: Denies sensitivity to latex  Therapeutic exercise:to centralize symptoms and improve ROM, strength, muscular endurance, and activity tolerance required for successful completion of functional activities. -NuStep level4using bilateral lower extremities. Seat setting 8. For improved extremity mobility, muscular endurance, and activity tolerance; and to induce the analgesic effect of aerobic exercise, stimulate improved joint nutrition, and prepare body structures and systems for following interventions. x71minutes. Average SPM =71 - side stepping with green theraband around ankles and CGA for safety.2x30 feet each direction.  - monster walks with green theraband around ankles and CGA for safety. 2x30 feet forwards and backwards.  - side step upto 9 inch stepwith BUE  support, 3x10each sidewithout foot support.  - deadlift, 1x10 with 10# DB, 2x10 with 20# KB   Pt required multimodal cuing for proper technique and to facilitate improved neuromuscular control, strength, range of motion, and functional ability resulting in improved performance and form.  HOME EXERCISE PROGRAM Access Code: [email or web address] URL: [email or web address] Date: 02/15/2020 Prepared by: Rosita Kea  Exercises ITB Stretch at Wall - 1 x daily - 3 reps - 30 seconds hold ITB Stretch at Wall - 1 x daily - 3 reps - 30 seconds hold Bridge - 1 x daily - 3 sets - 10 reps - 5 seconds hold .   PT Education - 03/06/20 1610    Education Details Exercise purpose/form. Self management techniques    Person(s) Educated Patient    Methods Explanation;Demonstration;Tactile cues;Verbal cues    Comprehension Verbalized understanding;Returned demonstration;Verbal cues required;Tactile cues required;Need further instruction            PT Short Term Goals - 02/13/20 1754  PT SHORT TERM GOAL #1   Title Be independent with initial home exercise program for self-management of symptoms.    Baseline to be intiated at visit 2 as appropriate (02/13/2020);    Time 3    Period Weeks    Status New    Target Date 03/05/20             PT Long Term Goals - 02/13/20 1753      PT LONG TERM GOAL #1   Title Be independent with a long-term home exercise program for self-management of symptoms.    Baseline to be intiated at visit 2 as appropriate (02/13/2020);    Time 12    Period Weeks    Status New   TARGET DATE FOR ALL LONG TERM GOALS: 05/07/2020     PT LONG TERM GOAL #2   Title Demonstrate improved FOTO score to equal or greater than 65 by visit 16#  to demonstrate improvement in overall condition and self-reported functional ability.    Baseline 44 (02/13/2020);    Time 12    Period Weeks    Status New      PT LONG TERM GOAL #3   Title Reduce pain with functional activities  to equal or less than 1/10 to allow patient to complete usual activities including ADLs, IADLs, and social engagement with less difficulty.    Baseline up to 5/10 (02/13/2020);    Time 12    Period Weeks    Status New      PT LONG TERM GOAL #4   Title Patient will score equal or greater than 25/30 on functional gait assessment to demonstrate low fall risk.    Baseline 21/30, moderate fall risk (02/13/2020);    Time 12    Period Weeks    Status New      PT LONG TERM GOAL #5   Title Complete community, work and/or recreational activities without limitation due to current condition.    Baseline Functional Limitations: stand up or sit down, walk up and down steps, squat, bend without pain, waling prolonged distances (over 30 min), cleaning, cooking, dressing, picking things up off the ground, sleeping (02/13/2020);    Time 12    Period Weeks    Status New                 Plan - 03/06/20 1652    Clinical Impression Statement Patient tolerated treatment well and reported no pain by end of session. Required cuing to decrease knee valgus during step up. Continues to experience pain with prolonged walking. Patient would benefit from continued management of limiting condition by skilled physical therapist to address remaining impairments and functional limitations to work towards stated goals and return to PLOF or maximal functional independence.    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      Examination-Activity Limitations Bend;Locomotion Level;Lift;Stairs;Squat;Stand;Sleep;Dressing;Transfers;Carry;Hygiene/Grooming    Examination-Participation Restrictions Cleaning;Yard Work;Community Activity;Meal Prep;Shop;Laundry;Other   Functional Limitations: stand up or sit down, walk up and down steps, squat, bend without pain, waling prolonged distances (over 30 min), cleaning, cooking, dressing, picking things up off the ground, sleeping   Stability/Clinical Decision Making Evolving/Moderate complexity  Rehab Potential Good    PT Frequency 2x / week    PT Duration 12 weeks    PT Treatment/Interventions ADLs/Self Care Home Management;Aquatic Therapy;Cryotherapy;Moist Heat;Gait training;Stair training;DME Instruction;Functional mobility training;Balance training;Neuromuscular re-education;Therapeutic exercise;Therapeutic activities;Cognitive remediation;Patient/family education;Manual techniques;Passive range of motion;Dry needling;Scar mobilization;Joint Manipulations;Spinal Manipulations    PT Next Visit Plan progressive glute, LE, and functional strengthening and flexibility exercises    PT Home Exercise Plan Medbridge Access Code: [email or web address]    Consulted and Agree with Plan of Care Patient           Patient will benefit from skilled therapeutic intervention in order to improve the following deficits and impairments:  Abnormal gait, Decreased cognition, Pain, Improper body mechanics, Increased muscle spasms, Decreased mobility, Decreased activity tolerance, Decreased endurance, Decreased range of motion, Decreased strength, Hypomobility, Impaired perceived functional ability, Impaired flexibility, Difficulty walking, Decreased balance  Visit Diagnosis: Pain in right hip  Pain in left hip  Unsteadiness on feet     Problem List Patient Active Problem List   Diagnosis Date Noted  . Anemia 02/11/2020  . 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 Rectal bleed  06/17/2019  . Pleural effusion 12/03/2018  . Right hip pain 12/03/2018  . Leukopenia 08/21/2018  . Change in vision 06/24/2018  . Hypothyroidism 05/02/2018  . Neck pain 05/02/2018  . Nonintractable headache 04/18/2018  . Thrush 04/18/2018  . Pleural effusion associated with pulmonary infection 04/18/2018  . Other fatigue 04/18/2018  . Abdominal pain 03/06/2016  . Acute diarrhea 01/10/2016  . Neck nodule 10/13/2015  . Vitamin D deficiency 07/27/2015  . Loose stools 04/14/2015  . Mild depression ([place]) 02/02/2015  . Health care maintenance 06/17/2014  . Stress 01/28/2014  . Acute pericarditis 05/31/2013  . Environmental allergies 02/25/2013  . Left elbow pain 02/25/2013  . Abnormal liver function test 12/07/2012  . Anxiety 05/06/2012  . GERD (gastroesophageal reflux disease) 05/06/2012  . Osteoporosis 05/06/2012  . History of breast cancer 09/18/2008  . History of thyroid cancer 09/18/2008  . Hypercholesterolemia 09/18/2008  . Peripheral neuropathy 09/18/2008  . Palpitations 09/18/2008  . History of malignant neoplasm of large intestine 09/18/2008    Everlean Alstrom. Graylon Good, PT, DPT 03/06/20, 4:53 PM  [place] [facility] [address], [place], [address] Phone: [phone]   Fax:  [phone]  Name: KEMYRA AUGUST MRN: [id number] Date of Birth: 03-03-1951

## 2020-03-06 NOTE — Progress Notes (Signed)
Patient was called for pre screening. She denies any questions or concerns at this time.

## 2020-03-07 ENCOUNTER — Inpatient Hospital Stay: Payer: PPO | Attending: Oncology | Admitting: Oncology

## 2020-03-07 ENCOUNTER — Encounter: Payer: Self-pay | Admitting: Oncology

## 2020-03-07 ENCOUNTER — Inpatient Hospital Stay: Payer: PPO

## 2020-03-07 VITALS — BP 128/74 | HR 90 | Temp 98.0°F | Resp 20 | Ht 64.0 in | Wt 147.0 lb

## 2020-03-07 DIAGNOSIS — F419 Anxiety disorder, unspecified: Secondary | ICD-10-CM | POA: Diagnosis not present

## 2020-03-07 DIAGNOSIS — R7989 Other specified abnormal findings of blood chemistry: Secondary | ICD-10-CM | POA: Diagnosis not present

## 2020-03-07 DIAGNOSIS — K219 Gastro-esophageal reflux disease without esophagitis: Secondary | ICD-10-CM | POA: Diagnosis not present

## 2020-03-07 DIAGNOSIS — D72819 Decreased white blood cell count, unspecified: Secondary | ICD-10-CM | POA: Insufficient documentation

## 2020-03-07 DIAGNOSIS — Z803 Family history of malignant neoplasm of breast: Secondary | ICD-10-CM | POA: Diagnosis not present

## 2020-03-07 DIAGNOSIS — Z85850 Personal history of malignant neoplasm of thyroid: Secondary | ICD-10-CM | POA: Insufficient documentation

## 2020-03-07 DIAGNOSIS — Z923 Personal history of irradiation: Secondary | ICD-10-CM | POA: Insufficient documentation

## 2020-03-07 DIAGNOSIS — Z79899 Other long term (current) drug therapy: Secondary | ICD-10-CM | POA: Diagnosis not present

## 2020-03-07 DIAGNOSIS — E7800 Pure hypercholesterolemia, unspecified: Secondary | ICD-10-CM | POA: Diagnosis not present

## 2020-03-07 DIAGNOSIS — E785 Hyperlipidemia, unspecified: Secondary | ICD-10-CM | POA: Insufficient documentation

## 2020-03-07 DIAGNOSIS — E039 Hypothyroidism, unspecified: Secondary | ICD-10-CM | POA: Insufficient documentation

## 2020-03-07 DIAGNOSIS — Z9221 Personal history of antineoplastic chemotherapy: Secondary | ICD-10-CM | POA: Insufficient documentation

## 2020-03-07 DIAGNOSIS — Z853 Personal history of malignant neoplasm of breast: Secondary | ICD-10-CM | POA: Insufficient documentation

## 2020-03-07 DIAGNOSIS — Z801 Family history of malignant neoplasm of trachea, bronchus and lung: Secondary | ICD-10-CM | POA: Insufficient documentation

## 2020-03-07 DIAGNOSIS — M1990 Unspecified osteoarthritis, unspecified site: Secondary | ICD-10-CM | POA: Insufficient documentation

## 2020-03-07 LAB — CBC WITH DIFFERENTIAL/PLATELET
Abs Immature Granulocytes: 0 10*3/uL (ref 0.00–0.07)
Basophils Absolute: 0 10*3/uL (ref 0.0–0.1)
Basophils Relative: 1 %
Eosinophils Absolute: 0 10*3/uL (ref 0.0–0.5)
Eosinophils Relative: 1 %
HCT: 38.6 % (ref 36.0–46.0)
Hemoglobin: 12.9 g/dL (ref 12.0–15.0)
Immature Granulocytes: 0 %
Lymphocytes Relative: 37 %
Lymphs Abs: 0.6 10*3/uL — ABNORMAL LOW (ref 0.7–4.0)
MCH: 27.2 pg (ref 26.0–34.0)
MCHC: 33.4 g/dL (ref 30.0–36.0)
MCV: 81.4 fL (ref 80.0–100.0)
Monocytes Absolute: 0.2 10*3/uL (ref 0.1–1.0)
Monocytes Relative: 12 %
Neutro Abs: 0.9 10*3/uL — ABNORMAL LOW (ref 1.7–7.7)
Neutrophils Relative %: 49 %
Platelets: 186 10*3/uL (ref 150–400)
RBC: 4.74 MIL/uL (ref 3.87–5.11)
RDW: 13.4 % (ref 11.5–15.5)
WBC: 1.7 10*3/uL — ABNORMAL LOW (ref 4.0–10.5)
nRBC: 0 % (ref 0.0–0.2)

## 2020-03-07 LAB — FERRITIN: Ferritin: 56 ng/mL (ref 11–307)

## 2020-03-07 LAB — IRON AND TIBC
Iron: 52 ug/dL (ref 28–170)
Saturation Ratios: 14 % (ref 10.4–31.8)
TIBC: 372 ug/dL (ref 250–450)
UIBC: 320 ug/dL

## 2020-03-07 LAB — FOLATE: Folate: 50 ng/mL (ref >5.9)

## 2020-03-07 LAB — VITAMIN B12: Vitamin B-12: 1077 pg/mL — ABNORMAL HIGH (ref 180–914)

## 2020-03-11 ENCOUNTER — Ambulatory Visit: Payer: PPO | Admitting: Physical Therapy

## 2020-03-12 LAB — COMP PANEL: LEUKEMIA/LYMPHOMA: Immunophenotypic Profile: 6

## 2020-03-13 ENCOUNTER — Ambulatory Visit: Payer: PPO | Admitting: Physical Therapy

## 2020-03-13 ENCOUNTER — Encounter: Payer: Self-pay | Admitting: Physical Therapy

## 2020-03-13 ENCOUNTER — Other Ambulatory Visit: Payer: Self-pay

## 2020-03-13 DIAGNOSIS — M25551 Pain in right hip: Secondary | ICD-10-CM

## 2020-03-13 DIAGNOSIS — R2681 Unsteadiness on feet: Secondary | ICD-10-CM

## 2020-03-13 DIAGNOSIS — M25552 Pain in left hip: Secondary | ICD-10-CM

## 2020-03-13 NOTE — Therapy (Signed)
[facility] [address] [place], [address] Phone: [phone]   Fax:  [phone]  Physical Therapy Treatment  Patient Details  Name: Debra Hale MRN: [id number] Date of Birth: 03/26/1951 Referring Provider (PT):  Odis Hollingshead, MD   Encounter Date: 03/13/2020   PT End of Session - 03/13/20 1529    Visit Number 9    Number of Visits 24    Date for PT Re-Evaluation 05/07/20    Authorization Type [employer] reporting period from 02/13/2020    Progress Note Due on Visit 10    PT Start Time 1521    PT Stop Time 1600    PT Time Calculation (min) 39 min    Equipment Utilized During Treatment Gait belt    Activity Tolerance Patient tolerated treatment well    Behavior During Therapy [facility] for tasks assessed/performed           Past Medical History:  Diagnosis Date  . Anxiety   . Arthritis    Osteoarthritis  . Breast cancer ([place]) 2009   right breast lumpectomy with rad tx  . Colon cancer ([place])    surgery with chemo and rad tx  . Complication of anesthesia   . GERD (gastroesophageal reflux disease)   . Heart palpitations   . History of hiatal hernia   . History of kidney stones   . Hypothyroidism   . Liver disease   . Liver nodule    s/p negative biopsy  . Malignant neoplasm of thyroid gland ([place]) 2002   s/p surgery and XRT  . Osteoporosis   . Other and unspecified hyperlipidemia   . Palpitations   . Personal history of chemotherapy   . Personal history of malignant neoplasm of large intestine    carcinoma - cecum, s/p right laparoscopic colectomy - s/p chemotherapy and XRT  . Personal history of radiation therapy   . Pneumonia 2019  . PONV (postoperative nausea and vomiting)   . Pure hypercholesterolemia   . Unspecified hereditary and idiopathic peripheral neuropathy     Past Surgical History:  Procedure Laterality Date  . APPENDECTOMY  1985  . BREAST BIOPSY Right  2009   positive  . BREAST BIOPSY Right 2009   negative  . BREAST LUMPECTOMY Right 2009   breast cancer  . CHOLECYSTECTOMY  1995  . COLONOSCOPY    . COLONOSCOPY WITH PROPOFOL N/A 10/29/2017   Procedure: COLONOSCOPY WITH PROPOFOL;  Surgeon: Manya Silvas, MD;  Location: [facility] ENDOSCOPY;  Service: Endoscopy;  Laterality: N/A;  . DILATION AND CURETTAGE OF UTERUS  1990  . DILATION AND CURETTAGE, DIAGNOSTIC / THERAPEUTIC  1990  . ESOPHAGOGASTRODUODENOSCOPY    . ESOPHAGOGASTRODUODENOSCOPY (EGD) WITH PROPOFOL N/A 10/29/2017   Procedure: ESOPHAGOGASTRODUODENOSCOPY (EGD) WITH PROPOFOL;  Surgeon: Manya Silvas, MD;  Location: [facility] ENDOSCOPY;  Service: Endoscopy;  Laterality: N/A;  . LAPAROSCOPIC PARTIAL COLECTOMY     stage 3-C carcinoma of the cecum, s/p chemotherapy and xrt  . LITHOTRIPSY    . SIGMOIDOSCOPY  08/26/1993  . THYROID LOBECTOMY  2002   s/p XRT  . TOTAL HIP ARTHROPLASTY Right 10/24/2019   Procedure: TOTAL HIP ARTHROPLASTY;  Surgeon: Corky Mull, MD;  Location: [facility] ORS;  Service: Orthopedics;  Laterality: Right;    There were no vitals filed for this visit.   Subjective Assessment - 03/13/20 1524    Subjective Patient reports she is continuing to have pain when she walks or stands  a long time. Pain is at bilateral hips at the groin and lateral hips at times. She was sick earlier in the week but feels better now. States she was pretty tired folowing her last session .    Pertinent History Patient is a 69 y.o. female who presents to outpatient physical therapy with a referral for medical diagnosis s/p R THA, R and L trochanteric bursitis. Physician specifically requested IT band stretching. This patient's chief complaints consist of B hip pain following R THA on 10/24/2019 leading to the following functional deficits: difficulty stand up or sit down, walk up and down steps, squat, bend without pain, waling prolonged distances (over 30 min), cleaning, cooking, dressing, picking things  up off the ground, sleeping. Relevant past medical history and comorbidities include hx of pneumonia and acute pericarditis (resolved), GERD, hypothyroidism, peripheral neuropathy in hands from chemo, osteoporosis, hx R breast cancer (2009), thyroid cancer (2002),colon cancer (afer thyroid cancer and before breast cancer), sinus headaches, cholecystectomy, appendectomy, kidney and gallstones.  Patient denies hx stroke, seizures, lung problem, major cardiac events, diabetes, unexplained weight loss, changes in bowel or bladder problems recently, new onset stumbling or dropping things.    Limitations Lifting;Standing;Walking;House hold activities   Functional Limitations: stand up or sit down, walk up and down steps, squat, bend without pain, waling prolonged distances (over 30 min), cleaning, cooking, dressing, picking things up off the ground, sleeping.   How long can you sit comfortably? "longer I sit the more it hurts when I get up"    How long can you stand comfortably? 20 min    How long can you walk comfortably? 30 min    Patient Stated Goals "to not have any more pain"    Currently in Pain? No/denies    Pain Onset More than a month ago           TREATMENT: Denies sensitivity to latex  Therapeutic exercise:to centralize symptoms and improve ROM, strength, muscular endurance, and activity tolerance required for successful completion of functional activities. -NuStep level4using bilateral lower extremities. Seat setting 8. For improved extremity mobility, muscular endurance, and activity tolerance; and to induce the analgesic effect of aerobic exercise, stimulate improved joint nutrition, and prepare body structures and systems for following interventions. x29minutes. Average SPM =76 - side stepping with green theraband around ankles and CGA for safety.4x15 feet each direction.  - monster walks with green theraband around ankles and CGA for safety. 4x15 feet forwards and backwards.    - side step upto9inch stepwith BUE support, 3x10each sidewithout foot support.   Pt required multimodal cuing for proper technique and to facilitate improved neuromuscular control, strength, range of motion, and functional ability resulting in improved performance and form.  HOME EXERCISE PROGRAM Access Code: [email or web address] URL: [email or web address] Date: 02/15/2020 Prepared by: Rosita Kea  Exercises ITB Stretch at Wall - 1 x daily - 3 reps - 30 seconds hold ITB Stretch at [place] - 1 x daily - 3 reps - 30 seconds hold Bridge - 1 x daily - 3 sets - 10 reps - 5 seconds hold       PT Education - 03/13/20 1529    Education Details Exercise purpose/form. Self management techniques    Person(s) Educated Patient    Methods Explanation;Demonstration;Tactile cues;Verbal cues    Comprehension Verbalized understanding;Returned demonstration;Verbal cues required;Tactile cues required;Need further instruction            PT Short Term Goals - 02/13/20 1754      PT SHORT TERM  GOAL #1   Title Be independent with initial home exercise program for self-management of symptoms.    Baseline to be intiated at visit 2 as appropriate (02/13/2020);    Time 3    Period Weeks    Status New    Target Date 03/05/20             PT Long Term Goals - 02/13/20 1753      PT LONG TERM GOAL #1   Title Be independent with a long-term home exercise program for self-management of symptoms.    Baseline to be intiated at visit 2 as appropriate (02/13/2020);    Time 12    Period Weeks    Status New   TARGET DATE FOR ALL LONG TERM GOALS: 05/07/2020     PT LONG TERM GOAL #2   Title Demonstrate improved FOTO score to equal or greater than 65 by visit 16#  to demonstrate improvement in overall condition and self-reported functional ability.    Baseline 44 (02/13/2020);    Time 12    Period Weeks    Status New      PT LONG TERM GOAL #3   Title Reduce pain with functional activities to  equal or less than 1/10 to allow patient to complete usual activities including ADLs, IADLs, and social engagement with less difficulty.    Baseline up to 5/10 (02/13/2020);    Time 12    Period Weeks    Status New      PT LONG TERM GOAL #4   Title Patient will score equal or greater than 25/30 on functional gait assessment to demonstrate low fall risk.    Baseline 21/30, moderate fall risk (02/13/2020);    Time 12    Period Weeks    Status New      PT LONG TERM GOAL #5   Title Complete community, work and/or recreational activities without limitation due to current condition.    Baseline Functional Limitations: stand up or sit down, walk up and down steps, squat, bend without pain, waling prolonged distances (over 30 min), cleaning, cooking, dressing, picking things up off the ground, sleeping (02/13/2020);    Time 12    Period Weeks    Status New                 Plan - 03/13/20 1922    Clinical Impression Statement Patient appeared more concerned today about continued pain in bilateral hips with extended periods of standing and walking.  Is reporting pain more in the groin region but also states it has been present here since the beginning.  Patient was able to complete all exercises without increased pain at groin or lateral hips except slight discomfort in the left groin following step up exercise that resolved with rest.  Continued focusing on lateral hip strength and glutes strength to decrease pressure over the greater trochanters and improve function.Patient would benefit from continued management of limiting condition by skilled physical therapist to address remaining impairments and functional limitations to work towards stated goals and return to PLOF or maximal functional independence.    Personal Factors and Comorbidities Age;Comorbidity 3+;Social Background;Past/Current Experience;Fitness;Time since onset of injury/illness/exacerbation;Other   cognition   Comorbidities  Relevant past medical history and comorbidities include hx of pneumonia and acute pericarditis (resolved), GERD, hypothyroidism, peripheral neuropathy in hands from chemo, osteoporosis, hx R breast cancer (2009), thyroid cancer (2002),colon cancer (afer thyroid cancer and before breast cancer), sinus headaches, cholecystectomy, appendectomy, kidney and gallstones.  Examination-Activity Limitations Bend;Locomotion Level;Lift;Stairs;Squat;Stand;Sleep;Dressing;Transfers;Carry;Hygiene/Grooming    Examination-Participation Restrictions Cleaning;Yard Work;Community Activity;Meal Prep;Shop;Laundry;Other   Functional Limitations: stand up or sit down, walk up and down steps, squat, bend without pain, waling prolonged distances (over 30 min), cleaning, cooking, dressing, picking things up off the ground, sleeping   Stability/Clinical Decision Making Evolving/Moderate complexity    Rehab Potential Good    PT Frequency 2x / week    PT Duration 12 weeks    PT Treatment/Interventions ADLs/Self Care Home Management;Aquatic Therapy;Cryotherapy;Moist Heat;Gait training;Stair training;DME Instruction;Functional mobility training;Balance training;Neuromuscular re-education;Therapeutic exercise;Therapeutic activities;Cognitive remediation;Patient/family education;Manual techniques;Passive range of motion;Dry needling;Scar mobilization;Joint Manipulations;Spinal Manipulations    PT Next Visit Plan progressive glute, LE, and functional strengthening and flexibility exercises    PT Home Exercise Plan Medbridge Access Code: [email or web address]    Consulted and Agree with Plan of Care Patient           Patient will benefit from skilled therapeutic intervention in order to improve the following deficits and impairments:  Abnormal gait, Decreased cognition, Pain, Improper body mechanics, Increased muscle spasms, Decreased mobility, Decreased activity tolerance, Decreased endurance, Decreased range of motion, Decreased strength,  Hypomobility, Impaired perceived functional ability, Impaired flexibility, Difficulty walking, Decreased balance  Visit Diagnosis: Pain in right hip  Pain in left hip  Unsteadiness on feet     Problem List Patient Active Problem List   Diagnosis Date Noted  . Anemia 02/11/2020  . 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 Rectal bleed 06/17/2019  . Pleural effusion 12/03/2018  . Right hip pain 12/03/2018  . Leukopenia 08/21/2018  . Change in vision 06/24/2018  . Hypothyroidism 05/02/2018  . Neck pain 05/02/2018  . Nonintractable headache 04/18/2018  . Thrush 04/18/2018  . Pleural effusion associated with pulmonary infection 04/18/2018  . Other fatigue 04/18/2018  . Abdominal pain 03/06/2016  . Acute diarrhea 01/10/2016  . Neck nodule 10/13/2015  . Vitamin D deficiency 07/27/2015  . Loose stools 04/14/2015  . Mild depression ([place]) 02/02/2015  . Health care maintenance 06/17/2014  . Stress 01/28/2014  . Acute pericarditis 05/31/2013  . Environmental allergies 02/25/2013  . Left elbow pain 02/25/2013  . Abnormal liver function test 12/07/2012  . Anxiety 05/06/2012  . GERD (gastroesophageal reflux disease) 05/06/2012  . Osteoporosis 05/06/2012  . History of breast cancer 09/18/2008  . History of thyroid cancer 09/18/2008  . Hypercholesterolemia 09/18/2008  . Peripheral neuropathy 09/18/2008  . Palpitations 09/18/2008  . History of malignant neoplasm of large intestine 09/18/2008     Everlean Alstrom. Graylon Good, PT, DPT 03/13/20, 7:22 PM  [facility] [address] [place], [address] Phone: [phone]   Fax:  [phone]  Name: Debra Hale MRN: [id number] Date of Birth: [birth year]

## 2020-03-18 ENCOUNTER — Ambulatory Visit: Payer: PPO | Admitting: Physical Therapy

## 2020-03-18 ENCOUNTER — Other Ambulatory Visit: Payer: Self-pay

## 2020-03-18 DIAGNOSIS — M25551 Pain in right hip: Secondary | ICD-10-CM | POA: Diagnosis not present

## 2020-03-18 DIAGNOSIS — R2681 Unsteadiness on feet: Secondary | ICD-10-CM

## 2020-03-18 DIAGNOSIS — M25552 Pain in left hip: Secondary | ICD-10-CM

## 2020-03-18 NOTE — Therapy (Signed)
[facility] [address], [place], [address] Phone: [phone]   Fax:  [phone]  Physical Therapy Treatment / Progress Note  Reporting period: 02/13/2020 - 03/18/2020  Patient Details  Name: Debra Hale MRN: [id number] Date of Birth: 10-Sep-1950 Referring Provider (PT):  Odis Hollingshead, MD   Encounter Date: 03/18/2020   PT End of Session - 03/18/20 1432    Visit Number 10    Number of Visits 24    Date for PT Re-Evaluation 05/07/20    Authorization Type [employer] reporting period from 02/13/2020    Progress Note Due on Visit 10    PT Start Time 1355    PT Stop Time 1433    PT Time Calculation (min) 38 min    Equipment Utilized During Treatment Gait belt    Activity Tolerance Patient tolerated treatment well    Behavior During Therapy [facility] for tasks assessed/performed           Past Medical History:  Diagnosis Date  . Anxiety   . Arthritis    Osteoarthritis  . Breast cancer ([place]) 2009   right breast lumpectomy with rad tx  . Colon cancer ([place])    surgery with chemo and rad tx  . Complication of anesthesia   . GERD (gastroesophageal reflux disease)   . Heart palpitations   . History of hiatal hernia   . History of kidney stones   . Hypothyroidism   . Liver disease   . Liver nodule    s/p negative biopsy  . Malignant neoplasm of thyroid gland ([place]) 2002   s/p surgery and XRT  . Osteoporosis   . Other and unspecified hyperlipidemia   . Palpitations   . Personal history of chemotherapy   . Personal history of malignant neoplasm of large intestine    carcinoma - cecum, s/p right laparoscopic colectomy - s/p chemotherapy and XRT  . Personal history of radiation therapy   . Pneumonia 2019  . PONV (postoperative nausea and vomiting)   . Pure hypercholesterolemia   . Unspecified hereditary and idiopathic peripheral neuropathy     Past Surgical History:  Procedure  Laterality Date  . APPENDECTOMY  1985  . BREAST BIOPSY Right 2009   positive  . BREAST BIOPSY Right 2009   negative  . BREAST LUMPECTOMY Right 2009   breast cancer  . CHOLECYSTECTOMY  1995  . COLONOSCOPY    . COLONOSCOPY WITH PROPOFOL N/A 10/29/2017   Procedure: COLONOSCOPY WITH PROPOFOL;  Surgeon: Manya Silvas, MD;  Location: [facility] ENDOSCOPY;  Service: Endoscopy;  Laterality: N/A;  . DILATION AND CURETTAGE OF UTERUS  1990  . DILATION AND CURETTAGE, DIAGNOSTIC / THERAPEUTIC  1990  . ESOPHAGOGASTRODUODENOSCOPY    . ESOPHAGOGASTRODUODENOSCOPY (EGD) WITH PROPOFOL N/A 10/29/2017   Procedure: ESOPHAGOGASTRODUODENOSCOPY (EGD) WITH PROPOFOL;  Surgeon: Manya Silvas, MD;  Location: [facility] ENDOSCOPY;  Service: Endoscopy;  Laterality: N/A;  . LAPAROSCOPIC PARTIAL COLECTOMY     stage 3-C carcinoma of the cecum, s/p chemotherapy and xrt  . LITHOTRIPSY    . SIGMOIDOSCOPY  08/26/1993  . THYROID LOBECTOMY  2002   s/p XRT  . TOTAL HIP ARTHROPLASTY Right 10/24/2019   Procedure: TOTAL HIP ARTHROPLASTY;  Surgeon: Corky Mull, MD;  Location: [facility] ORS;  Service: Orthopedics;  Laterality: Right;    There were no vitals filed for this visit.    Subjective Assessment - 03/18/20 1406    Subjective Pateint reports her  lateral hip pain is improved and she does not have pain there, but she has pain when weight bearing over the bilateral groin. Rates pain during standing/walking 3/10. Wants to feel completely better and doesn't pay a lot of attention to in between. Did not do her HEP much since last PT session because she has been busy. Think she should continue PT. Highest pain number over the last week is 4/10.    Pertinent History Patient is a 69 y.o. female who presents to outpatient physical therapy with a referral for medical diagnosis s/p R THA, R and L trochanteric bursitis. Physician specifically requested IT band stretching. This patient's chief complaints consist of B hip pain following R THA on  10/24/2019 leading to the following functional deficits: difficulty stand up or sit down, walk up and down steps, squat, bend without pain, waling prolonged distances (over 30 min), cleaning, cooking, dressing, picking things up off the ground, sleeping. Relevant past medical history and comorbidities include hx of pneumonia and acute pericarditis (resolved), GERD, hypothyroidism, peripheral neuropathy in hands from chemo, osteoporosis, hx R breast cancer (2009), thyroid cancer (2002),colon cancer (afer thyroid cancer and before breast cancer), sinus headaches, cholecystectomy, appendectomy, kidney and gallstones.  Patient denies hx stroke, seizures, lung problem, major cardiac events, diabetes, unexplained weight loss, changes in bowel or bladder problems recently, new onset stumbling or dropping things.    Limitations Lifting;Standing;Walking;House hold activities   Functional Limitations: stand up or sit down, walk up and down steps, squat, bend without pain, waling prolonged distances (over 30 min), cleaning, cooking, dressing, picking things up off the ground, sleeping.   How long can you sit comfortably? "longer I sit the more it hurts when I get up"    How long can you stand comfortably? 15 min    How long can you walk comfortably? 30-45 min at the store    Patient Stated Goals "to not have any more pain"    Currently in Pain? Yes    Pain Score 3     Pain Location Hip    Pain Orientation Right;Left;Anterior    Pain Descriptors / Indicators Aching    Pain Type Chronic pain    Effect of Pain on Daily Activities Functional Limitations: stand up or sit down, squatting, bend without pain, walking prolonged distances (over 30-45 min), cleaning (if she has to bend to the floor, picking things up off the ground.             OBJECTIVE FOTO = 60 (03/18/2020);    FUNCTIONAL/BALANCE TESTS:  FGA = 26/30 (see above)  TREATMENT: Denies sensitivity to latex  Therapeutic exercise:to centralize  symptoms and improve ROM, strength, muscular endurance, and activity tolerance required for successful completion of functional activities. -NuStep level3using bilateral lower extremities. Seat setting 8. For improved extremity mobility, muscular endurance, and activity tolerance; and to induce the analgesic effect of aerobic exercise, stimulate improved joint nutrition, and prepare body structures and systems for following interventions. x17mnutes. Average SPM =81 - standing hip abduction with one knee on rolling stool with B UE support, 2x10 each side.  - standing mini cassock squat 2x10 each side with buttocks tap on elevated plinth behind her and tactile cues for improved form. Found very challenging.    Neuromuscular Re-education: to improve, balance, postural strength, muscle activation patterns, and stabilization strength required for functional activities: - Functional Gait Assessment to assess change in fall risk (See above).   Pt required multimodal cuing for proper technique and to facilitate  improved neuromuscular control, strength, range of motion, and functional ability resulting in improved performance and form.  HOME EXERCISE PROGRAM Access Code: [email or web address] URL: [email or web address] Date: 02/15/2020 Prepared by: Rosita Kea  Exercises ITB Stretch at Wall - 1 x daily - 3 reps - 30 seconds hold ITB Stretch at [place] - 1 x daily - 3 reps - 30 seconds hold Bridge - 1 x daily - 3 sets - 10 reps - 5 seconds hold    PT Education - 03/19/20 0935    Education Details Exercise purpose/form. Self management techniques. progress, poc    Person(s) Educated Patient    Methods Explanation;Demonstration;Tactile cues;Verbal cues    Comprehension Verbalized understanding;Returned demonstration;Verbal cues required;Tactile cues required;Need further instruction            PT Short Term Goals - 03/19/20 0936      PT SHORT TERM GOAL #1   Title Be independent with  initial home exercise program for self-management of symptoms.    Baseline to be intiated at visit 2 as appropriate (02/13/2020);    Time 3    Period Weeks    Status Achieved    Target Date 03/05/20             PT Long Term Goals - 03/19/20 0936      PT LONG TERM GOAL #1   Title Be independent with a long-term home exercise program for self-management of symptoms.    Baseline to be intiated at visit 2 as appropriate (02/13/2020); currently participating when she is not too busy (03/19/2020);    Time 12    Period Weeks    Status Partially Met   TARGET DATE FOR ALL LONG TERM GOALS: 05/07/2020     PT LONG TERM GOAL #2   Title Demonstrate improved FOTO score to equal or greater than 65 by visit 16#  to demonstrate improvement in overall condition and self-reported functional ability.    Baseline 44 (02/13/2020); 60 at visit #10 (03/18/2020);    Time 12    Period Weeks    Status Partially Met      PT LONG TERM GOAL #3   Title Reduce pain with functional activities to equal or less than 1/10 to allow patient to complete usual activities including ADLs, IADLs, and social engagement with less difficulty.    Baseline up to 5/10 (02/13/2020); up to 4/10 (03/18/2020);    Time 12    Period Weeks    Status Partially Met      PT LONG TERM GOAL #4   Title Patient will score equal or greater than 25/30 on functional gait assessment to demonstrate low fall risk.    Baseline 21/30, moderate fall risk (02/13/2020); 26/30, low fall risk (03/18/2020);    Time 12    Period Weeks    Status Achieved      PT LONG TERM GOAL #5   Title Complete community, work and/or recreational activities without limitation due to current condition.    Baseline Functional Limitations: stand up or sit down, walk up and down steps, squat, bend without pain, waling prolonged distances (over 30 min), cleaning, cooking, dressing, picking things up off the ground, sleeping (02/13/2020); stand up or sit down, squatting,  bend without pain, walking prolonged distances (over 30-45 min), cleaning (if she has to bend to the floor, picking things up off the ground (03/18/2020);    Time 12    Period Weeks    Status Partially Met  Plan - 03/19/20 0934    Clinical Impression Statement Patient has attended 10 physical therapy sessions and reports good improvement of pain at lateral hips but continues to be troubled by pain with prolonged standing or walking that is currently near the groin. Has improved significantly on Functional Gait Assessment to low fall risk with mild gait deviation with shuffling feet some. Patient does have some difficulty with memory at times that may affect her perception or report of function and pain. Has improved significantly on FOTO score but has not yet reached goal and also has not reached her pain goal. Plan to continue focusing on functional and LE strength and balance to improve function and comfort. Patient would benefit from continued management of limiting condition by skilled physical therapist to address remaining impairments and functional limitations to work towards stated goals and return to PLOF or maximal functional independence.    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    Examination-Activity Limitations Bend;Locomotion Level;Lift;Stairs;Squat;Stand;Sleep;Dressing;Transfers;Carry;Hygiene/Grooming    Examination-Participation Restrictions Cleaning;Yard  Work;Community Activity;Meal Prep;Shop;Laundry;Other   Functional Limitations: stand up or sit down, walk up and down steps, squat, bend without pain, waling prolonged distances (over 30 min), cleaning, cooking, dressing, picking things up off the ground, sleeping   Stability/Clinical Decision Making Evolving/Moderate complexity    Rehab Potential Good    PT Frequency 2x / week    PT Duration 12 weeks    PT Treatment/Interventions ADLs/Self Care Home Management;Aquatic Therapy;Cryotherapy;Moist Heat;Gait training;Stair training;DME Instruction;Functional mobility training;Balance training;Neuromuscular re-education;Therapeutic exercise;Therapeutic activities;Cognitive remediation;Patient/family education;Manual techniques;Passive range of motion;Dry needling;Scar mobilization;Joint Manipulations;Spinal Manipulations    PT Next Visit Plan progressive glute, LE, and functional strengthening, flexibility, and balance exercises    PT Home Exercise Plan Medbridge Access Code: [email or web address]    Consulted and Agree with Plan of Care Patient           Patient will benefit from skilled therapeutic intervention in order to improve the following deficits and impairments:  Abnormal gait, Decreased cognition, Pain, Improper body mechanics, Increased muscle spasms, Decreased mobility, Decreased activity tolerance, Decreased endurance, Decreased range of motion, Decreased strength, Hypomobility, Impaired perceived functional ability, Impaired flexibility, Difficulty walking, Decreased balance  Visit Diagnosis: Pain in right hip  Pain in left hip  Unsteadiness on feet     Problem List Patient Active Problem List   Diagnosis Date Noted  . Anemia 02/11/2020  . 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 Rectal bleed 06/17/2019  . Pleural effusion 12/03/2018  . Right hip pain 12/03/2018  . Leukopenia 08/21/2018  . Change in vision 06/24/2018  . Hypothyroidism 05/02/2018  . Neck pain 05/02/2018   . Nonintractable headache 04/18/2018  . Thrush 04/18/2018  . Pleural effusion associated with pulmonary infection 04/18/2018  . Other fatigue 04/18/2018  . Abdominal pain 03/06/2016  . Acute diarrhea 01/10/2016  . Neck nodule 10/13/2015  . Vitamin D deficiency 07/27/2015  . Loose stools 04/14/2015  . Mild depression ([place]) 02/02/2015  . Health care maintenance 06/17/2014  . Stress 01/28/2014  . Acute pericarditis 05/31/2013  . Environmental allergies 02/25/2013  . Left elbow pain 02/25/2013  . Abnormal liver function test 12/07/2012  . Anxiety  05/06/2012  . GERD (gastroesophageal reflux disease) 05/06/2012  . Osteoporosis 05/06/2012  . History of breast cancer 09/18/2008  . History of thyroid cancer 09/18/2008  . Hypercholesterolemia 09/18/2008  . Peripheral neuropathy 09/18/2008  . Palpitations 09/18/2008  . History of malignant neoplasm of large intestine 09/18/2008    Everlean Alstrom. Graylon Good, PT, DPT 03/19/20, 9:43 AM  [facility] [address] [place], [address] Phone: [phone]   Fax:  [phone]  Name: Debra Hale MRN: [id number] Date of Birth: 10-26-1950

## 2020-03-19 ENCOUNTER — Ambulatory Visit (INDEPENDENT_AMBULATORY_CARE_PROVIDER_SITE_OTHER): Payer: PPO | Admitting: Cardiovascular Disease

## 2020-03-19 ENCOUNTER — Encounter: Payer: Self-pay | Admitting: Cardiovascular Disease

## 2020-03-19 VITALS — BP 130/70 | HR 77 | Ht 64.0 in | Wt 140.2 lb

## 2020-03-19 DIAGNOSIS — R002 Palpitations: Secondary | ICD-10-CM

## 2020-03-19 NOTE — Progress Notes (Signed)
Cardiology Office Note   Date:  03/19/2020   ID:  JENIFFER Hale, DOB [birth year], MRN [id number]  PCP:  [name], MD  Cardiologist:   Kathlyn Sacramento, MD   Chief Complaint  Patient presents with  . OTHER    6 month f/u no complaints today. Meds reviewed verbally with pt.      History of Present Illness: Debra Hale is a 69 y.o. female who presents for a follow up visit recurrent pericarditis.  She has known history of hyperlipidemia, thyroid carcinoma s/p surgery and XRT, colon cancer s/p laparoscopic right colectomy, nephrolithiasis, anxiety, osteoporosis and breast cancer. She had palpitations in the setting of hypokalemia and hypomagnesemia.  She had previous treadmill stress test in 2017 that showed no evidence of ischemia with poor exercise tolerance and hypertensive response to exercise.  She had recurrent pericarditis and previous small pericardial effusion since 2017.   She was most recently seen by Laurann Montana in May of this year for preoperative cardiovascular evaluation before hip replacement.  She underwent a Lexiscan Myoview which showed no evidence of ischemia with normal ejection fraction.  She was noted to have minimal coronary artery and aortic calcifications.  She underwent hip replacement in June but she continues to struggle with bursitis.  She denies chest pain or shortness of breath.  She has very mild palpitations.  Past Medical History:  Diagnosis Date  . Anxiety   . Arthritis    Osteoarthritis  . Breast cancer ([place]) 2009   right breast lumpectomy with rad tx  . Colon cancer ([place])    surgery with chemo and rad tx  . Complication of anesthesia   . GERD (gastroesophageal reflux disease)   . Heart palpitations   . History of hiatal hernia   . History of kidney stones   . Hypothyroidism   . Liver disease   . Liver nodule    s/p negative biopsy  . Malignant neoplasm of thyroid gland ([place]) 2002   s/p surgery and XRT  . Osteoporosis     . Other and unspecified hyperlipidemia   . Palpitations   . Personal history of chemotherapy   . Personal history of malignant neoplasm of large intestine    carcinoma - cecum, s/p right laparoscopic colectomy - s/p chemotherapy and XRT  . Personal history of radiation therapy   . Pneumonia 2019  . PONV (postoperative nausea and vomiting)   . Pure hypercholesterolemia   . Unspecified hereditary and idiopathic peripheral neuropathy     Past Surgical History:  Procedure Laterality Date  . APPENDECTOMY  1985  . BREAST BIOPSY Right 2009   positive  . BREAST BIOPSY Right 2009   negative  . BREAST LUMPECTOMY Right 2009   breast cancer  . CHOLECYSTECTOMY  1995  . COLONOSCOPY    . COLONOSCOPY WITH PROPOFOL N/A 10/29/2017   Procedure: COLONOSCOPY WITH PROPOFOL;  Surgeon: Manya Silvas, MD;  Location: [facility] ENDOSCOPY;  Service: Endoscopy;  Laterality: N/A;  . DILATION AND CURETTAGE OF UTERUS  1990  . DILATION AND CURETTAGE, DIAGNOSTIC / THERAPEUTIC  1990  . ESOPHAGOGASTRODUODENOSCOPY    . ESOPHAGOGASTRODUODENOSCOPY (EGD) WITH PROPOFOL N/A 10/29/2017   Procedure: ESOPHAGOGASTRODUODENOSCOPY (EGD) WITH PROPOFOL;  Surgeon: Manya Silvas, MD;  Location: [facility] ENDOSCOPY;  Service: Endoscopy;  Laterality: N/A;  . LAPAROSCOPIC PARTIAL COLECTOMY     stage 3-C carcinoma of the cecum, s/p chemotherapy and xrt  . LITHOTRIPSY    . SIGMOIDOSCOPY  08/26/1993  . THYROID LOBECTOMY  2002   s/p XRT  . TOTAL HIP ARTHROPLASTY Right 10/24/2019   Procedure: TOTAL HIP ARTHROPLASTY;  Surgeon: Corky Mull, MD;  Location: [facility] ORS;  Service: Orthopedics;  Laterality: Right;     Current Outpatient Medications  Medication Sig Dispense Refill  . Azelastine-Fluticasone (DYMISTA) 137-50 MCG/ACT SUSP 2 sprays each nostril q day 23 g 2  . clonazePAM (KLONOPIN) 0.5 MG tablet Take 1.5 mg by mouth 2 (two) times daily.   0  . colestipol (COLESTID) 1 g tablet Take 1 g by mouth every morning. TAKES FOR DIARRHEA     . escitalopram (LEXAPRO) 20 MG tablet Take 20 mg by mouth every morning.     Marland Kitchen ibuprofen (ADVIL) 200 MG tablet Take 200 mg by mouth every 8 (eight) hours as needed for moderate pain.    Marland Kitchen ibuprofen (ADVIL,MOTRIN) 600 MG tablet Take 1 tablet (600 mg total) by mouth every 6 (six) hours as needed. 30 tablet 0  . levothyroxine (SYNTHROID) 125 MCG tablet Take 1 tablet (125 mcg total) by mouth daily. 30 tablet 2  . magnesium oxide (MAG-OX) 400 MG tablet TAKE 1 TABLET BY MOUTH EVERY DAY 90 tablet 1  . pantoprazole (PROTONIX) 40 MG tablet TAKE 1 TABLET BY MOUTH EVERY DAY (Patient taking differently: Take 40 mg by mouth every morning. ) 90 tablet 2  . potassium chloride (KLOR-CON) 10 MEQ tablet TAKE 1 TABLET BY MOUTH EVERY DAY 90 tablet 1   Current Facility-Administered Medications  Medication Dose Route Frequency Provider Last Rate Last Admin  . ondansetron (ZOFRAN) tablet 8 mg  8 mg Oral Once Guse, Jacquelynn Cree, FNP        Allergies:   Demeclocycline, Tetracyclines & related, Augmentin [amoxicillin-pot clavulanate], Bentyl [dicyclomine hcl], Ciprofloxacin, Codeine, Epinephrine, Flagyl [metronidazole], Librax [chlordiazepoxide-clidinium], Phenobarbital, Prednisone, and Ultram [tramadol]    Social History:  The patient  reports that she has never smoked. She has never used smokeless tobacco. She reports that she does not drink alcohol and does not use drugs.   Family History:  The patient's family history includes Alzheimer's disease in her mother; Breast cancer in her maternal aunt and sister; Cancer in her father and mother; Colon cancer in her father; Lung cancer in her father and sister; Prostate cancer in her father; Stroke in her mother.    ROS:  Please see the history of present illness.   Otherwise, review of systems are positive for none.   All other systems are reviewed and negative.    PHYSICAL EXAM: VS:  BP 130/70 (BP Location: Left Arm, Patient Position: Sitting, Cuff Size: Normal)    Pulse 77   Ht 5\' 4"  (1.626 m)   Wt 140 lb 4 oz (63.6 kg)   SpO2 97%   BMI 24.07 kg/m  , BMI Body mass index is 24.07 kg/m. GEN: Well nourished, well developed, in no acute distress  HEENT: normal  Neck: no JVD, carotid bruits, or masses Cardiac: RRR; no murmurs, rubs, or gallops,no edema  Respiratory:  clear to auscultation bilaterally, normal work of breathing GI: soft, nontender, nondistended, + BS MS: no deformity or atrophy  Skin: warm and dry, no rash Neuro:  Strength and sensation are intact Psych: euthymic mood, full affect   EKG:  EKG is  ordered today. EKG showed normal sinus rhythm with low voltage and nonspecific T wave changes.   Recent Labs: 02/20/2020: ALT 20; BUN 13; Creatinine, Ser 0.98; Sodium 143; TSH 17.16 03/05/2020: Potassium 3.4 03/07/2020: Hemoglobin 12.9; Platelets 186  Lipid Panel    Component Value Date/Time   CHOL 163 02/20/2020 1045   TRIG 110.0 02/20/2020 1045   HDL 40.70 02/20/2020 1045   CHOLHDL 4 02/20/2020 1045   VLDL 22.0 02/20/2020 1045   LDLCALC 100 (H) 02/20/2020 1045   LDLDIRECT 126.0 07/11/2015 0956      Wt Readings from Last 3 Encounters:  03/19/20 140 lb 4 oz (63.6 kg)  03/07/20 147 lb (66.7 kg)  10/24/19 150 lb (68 kg)       ASSESSMENT AND PLAN:  1.  History of recurrent pericarditis: No recent symptoms.  She seems to be stable overall.  Most recent echocardiogram in January 2020 showed an EF of 50 to 55% with no pericardial effusion.  2. Palpitations: In the setting of hypokalemia and hypomagnesemia.  She has minimal symptoms.    Disposition:   FU in 12 months  Signed,  Kathlyn Sacramento, MD  03/19/2020 4:27 PM    [place]

## 2020-03-19 NOTE — Patient Instructions (Addendum)
Medication Instructions:  Your physician recommends that you continue on your current medications as directed. Please refer to the Current Medication list given to you today.  *If you need a refill on your cardiac medications before your next appointment, please call your pharmacy*   Lab Work: None ordered If you have labs (blood work) drawn today and your tests are completely normal, you will receive your results only by: Marland Kitchen MyChart Message (if you have MyChart) OR . A paper copy in the mail If you have any lab test that is abnormal or we need to change your treatment, we will call you to review the results.   Testing/Procedures: None ordered   Follow-Up: At [facility], you and your health needs are our priority.  As part of our continuing mission to provide you with exceptional heart care, we have created designated Provider Care Teams.  These Care Teams include your primary Cardiologist (physician) and Advanced Practice Providers (APPs -  Physician Assistants and Nurse Practitioners) who all work together to provide you with the care you need, when you need it.  We recommend signing up for the patient portal called "MyChart".  Sign up information is provided on this After Visit Summary.  MyChart is used to connect with patients for Virtual Visits (Telemedicine).  Patients are able to view lab/test results, encounter notes, upcoming appointments, etc.  Non-urgent messages can be sent to your provider as well.   To learn more about what you can do with MyChart, go to [email or web address].    Your next appointment:   12 month(s)  The format for your next appointment:   In Person  Provider:   You may see Kathlyn Sacramento, MD or one of the following Advanced Practice Providers on your designated Care Team:    Murray Hodgkins, NP  Christell Faith, PA-C  Marrianne Mood, PA-C  C[place], [place]  Laurann [place], NP    Other Instructions N/a

## 2020-03-20 LAB — NEUTROPHIL AB TEST LEVEL 1: NEUTROPHIL SCR/PANEL INTERP.: POSITIVE — AB

## 2020-03-21 ENCOUNTER — Ambulatory Visit: Payer: PPO | Admitting: Physical Therapy

## 2020-03-21 ENCOUNTER — Encounter: Payer: Self-pay | Admitting: Physical Therapy

## 2020-03-21 ENCOUNTER — Other Ambulatory Visit: Payer: Self-pay

## 2020-03-21 DIAGNOSIS — R2681 Unsteadiness on feet: Secondary | ICD-10-CM

## 2020-03-21 DIAGNOSIS — M25552 Pain in left hip: Secondary | ICD-10-CM

## 2020-03-21 DIAGNOSIS — M25551 Pain in right hip: Secondary | ICD-10-CM

## 2020-03-21 NOTE — Therapy (Signed)
[facility] [address] [place], [address] Phone: [phone]   Fax:  [phone]  Physical Therapy Treatment  Patient Details  Name: Debra Hale MRN: [id number] Date of Birth: 10/29/1950 Referring Provider (PT):  Odis Hollingshead, MD   Encounter Date: 03/21/2020   PT End of Session - 03/21/20 1449    Visit Number 11    Number of Visits 24    Date for PT Re-Evaluation 05/07/20    Authorization Type [employer] reporting period from 03/21/2020    Progress Note Due on Visit 10    PT Start Time 1350    PT Stop Time 1430    PT Time Calculation (min) 40 min    Equipment Utilized During Treatment Gait belt    Activity Tolerance Patient tolerated treatment well    Behavior During Therapy [facility] for tasks assessed/performed           Past Medical History:  Diagnosis Date  . Anxiety   . Arthritis    Osteoarthritis  . Breast cancer ([place]) 2009   right breast lumpectomy with rad tx  . Colon cancer ([place])    surgery with chemo and rad tx  . Complication of anesthesia   . GERD (gastroesophageal reflux disease)   . Heart palpitations   . History of hiatal hernia   . History of kidney stones   . Hypothyroidism   . Liver disease   . Liver nodule    s/p negative biopsy  . Malignant neoplasm of thyroid gland ([place]) 2002   s/p surgery and XRT  . Osteoporosis   . Other and unspecified hyperlipidemia   . Palpitations   . Personal history of chemotherapy   . Personal history of malignant neoplasm of large intestine    carcinoma - cecum, s/p right laparoscopic colectomy - s/p chemotherapy and XRT  . Personal history of radiation therapy   . Pneumonia 2019  . PONV (postoperative nausea and vomiting)   . Pure hypercholesterolemia   . Unspecified hereditary and idiopathic peripheral neuropathy     Past Surgical History:  Procedure Laterality Date  . APPENDECTOMY  1985  . BREAST BIOPSY Right  2009   positive  . BREAST BIOPSY Right 2009   negative  . BREAST LUMPECTOMY Right 2009   breast cancer  . CHOLECYSTECTOMY  1995  . COLONOSCOPY    . COLONOSCOPY WITH PROPOFOL N/A 10/29/2017   Procedure: COLONOSCOPY WITH PROPOFOL;  Surgeon: Manya Silvas, MD;  Location: [facility] ENDOSCOPY;  Service: Endoscopy;  Laterality: N/A;  . DILATION AND CURETTAGE OF UTERUS  1990  . DILATION AND CURETTAGE, DIAGNOSTIC / THERAPEUTIC  1990  . ESOPHAGOGASTRODUODENOSCOPY    . ESOPHAGOGASTRODUODENOSCOPY (EGD) WITH PROPOFOL N/A 10/29/2017   Procedure: ESOPHAGOGASTRODUODENOSCOPY (EGD) WITH PROPOFOL;  Surgeon: Manya Silvas, MD;  Location: [facility] ENDOSCOPY;  Service: Endoscopy;  Laterality: N/A;  . LAPAROSCOPIC PARTIAL COLECTOMY     stage 3-C carcinoma of the cecum, s/p chemotherapy and xrt  . LITHOTRIPSY    . SIGMOIDOSCOPY  08/26/1993  . THYROID LOBECTOMY  2002   s/p XRT  . TOTAL HIP ARTHROPLASTY Right 10/24/2019   Procedure: TOTAL HIP ARTHROPLASTY;  Surgeon: Corky Mull, MD;  Location: [facility] ORS;  Service: Orthopedics;  Laterality: Right;    There were no vitals filed for this visit.   Subjective Assessment - 03/21/20 1354    Subjective Patient reports she is down today because the funeral for her good friend  who passed suddenly last week is later this evening. States this person knew her well and they did things together frequently. She states her hip pain continues to come and go. She has it more right when she stand up (including when she came back to the PT gym) but it gets better with movement. States she feels like some of the things she did in PT last session hit areas that needed it and seemed to make a difference.    Pertinent History Patient is a 69 y.o. female who presents to outpatient physical therapy with a referral for medical diagnosis s/p R THA, R and L trochanteric bursitis. Physician specifically requested IT band stretching. This patient's chief complaints consist of B hip pain following  R THA on 10/24/2019 leading to the following functional deficits: difficulty stand up or sit down, walk up and down steps, squat, bend without pain, waling prolonged distances (over 30 min), cleaning, cooking, dressing, picking things up off the ground, sleeping. Relevant past medical history and comorbidities include hx of pneumonia and acute pericarditis (resolved), GERD, hypothyroidism, peripheral neuropathy in hands from chemo, osteoporosis, hx R breast cancer (2009), thyroid cancer (2002),colon cancer (afer thyroid cancer and before breast cancer), sinus headaches, cholecystectomy, appendectomy, kidney and gallstones.  Patient denies hx stroke, seizures, lung problem, major cardiac events, diabetes, unexplained weight loss, changes in bowel or bladder problems recently, new onset stumbling or dropping things.    Limitations Lifting;Standing;Walking;House hold activities   Functional Limitations: stand up or sit down, walk up and down steps, squat, bend without pain, waling prolonged distances (over 30 min), cleaning, cooking, dressing, picking things up off the ground, sleeping.   How long can you sit comfortably? "longer I sit the more it hurts when I get up"    How long can you stand comfortably? 15 min    How long can you walk comfortably? 30-45 min at the store    Patient Stated Goals "to not have any more pain"    Currently in Pain? Yes    Pain Score 3    when she stands at bilateral groin, left > right             [facility] PT Assessment - 03/21/20 0001      Assessment   Medical Diagnosis s/p R THA, R and L trochanteric bursitis    Referring Provider (PT)  Odis Hollingshead, MD    Onset Date/Surgical Date 10/24/19    Hand Dominance Right    Next MD Visit maybe november    Prior Therapy prior to sx, post op in hospital, rehab facility, and HHPT      Precautions   Precautions Fall   not that she is aware of; did have posterior approach THA     Restrictions   Weight Bearing  Restrictions No      [place] residence    Living Arrangements Alone    Available Help at Discharge --   no family close by; freinds close by   Type of Home Other(Comment)   [place] Access Elevator;Stairs to enter    Entrance Stairs-Number of Steps --   on 3rd floor   Home Layout One level    [place] - 4 wheels;Cane - quad;Cane - single point;Shower seat;Grab bars - toilet      Prior Function   Level of Independence Independent    Vocation Retired    [employer] work previously  Leisure dance (any kind, shag club), traveling, shopping, being with freinds, texting, talking on phone      Cognition   Overall Cognitive Status No family/caregiver present to determine baseline cognitive functioning   does seem to have some memory deficits and slow processing     Functional Gait  Assessment   Gait assessed  Yes   last tested 03/18/2020   Gait Level Surface Walks 20 ft in less than 5.5 sec, no assistive devices, good speed, no evidence for imbalance, normal gait pattern, deviates no more than 6 in outside of the 12 in walkway width.   5 seconds, scuffing feet   Change in Gait Speed Able to smoothly change walking speed without loss of balance or gait deviation. Deviate no more than 6 in outside of the 12 in walkway width.    Gait with Horizontal Head Turns Performs head turns smoothly with no change in gait. Deviates no more than 6 in outside 12 in walkway width    Gait with Vertical Head Turns Performs head turns with no change in gait. Deviates no more than 6 in outside 12 in walkway width.    Gait and Pivot Turn Pivot turns safely within 3 sec and stops quickly with no loss of balance.    Step Over Obstacle Is able to step over 2 stacked shoe boxes taped together (9 in total height) without changing gait speed. No evidence of imbalance.    Gait with Narrow Base of Support Is able to ambulate for 10 steps heel to toe  with no staggering.    Gait with Eyes Closed Cannot walk 20 ft without assistance, severe gait deviations or imbalance, deviates greater than 15 in outside 12 in walkway width or will not attempt task.    Ambulating Backwards Walks 20 ft, uses assistive device, slower speed, mild gait deviations, deviates 6-10 in outside 12 in walkway width.    Steps Alternating feet, no rail.    Total Score 26    FGA comment: 25-28 = low risk fall    patient noted for scuffing feet some and almost running             TREATMENT: Denies sensitivity to latex  Therapeutic exercise:to centralize symptoms and improve ROM, strength, muscular endurance, and activity tolerance required for successful completion of functional activities. -NuStep level3using bilateral lower extremities. Seat setting 8. For improved extremity mobility, muscular endurance, and activity tolerance; and to induce the analgesic effect of aerobic exercise, stimulate improved joint nutrition, and prepare body structures and systems for following interventions. x31mnutes. Average SPM =82 - standing hip abduction with one knee on rolling stool with B UE support, 3x10 each side.  - standing mini cassock squat 2x10 each side with buttocks tap on elevated plinth (22.5 inches) behind her and tactile cues for improved form. Found very challenging.    - monster walks with green theraband around ankles and CGA for safety. 2x30 feet forwards and backwards.   Neuromuscular Re-education: to improve, balance, postural strength, muscle activation patterns, and stabilization strength required for functional activities (CGA for safety):  - tandem walking 2x30 feet - eyes closed walking, 4x30 feet - backwards walking, 2x30 feet  Pt required multimodal cuing for proper technique and to facilitate improved neuromuscular control, strength, range of motion, and functional ability resulting in improved performance and form.  HOME EXERCISE  PROGRAM Access Code: [email or web address]: [email or web address] Date: 02/15/2020 Prepared by: SRosita Kea [facility]ercises [facility]- 1 x daily -  3 reps - 30 seconds hold ITB Stretch at Wall - 1 x daily - 3 reps - 30 seconds hold Bridge - 1 x daily - 3 sets - 10 reps - 5 seconds hold     PT Education - 03/21/20 1449    Education Details Exercise purpose/form. Self management techniques.    Person(s) Educated Patient    Methods Explanation;Demonstration;Tactile cues;Verbal cues    Comprehension Verbalized understanding;Returned demonstration;Verbal cues required;Tactile cues required;Need further instruction            PT Short Term Goals - 03/19/20 0936      PT SHORT TERM GOAL #1   Title Be independent with initial home exercise program for self-management of symptoms.    Baseline to be intiated at visit 2 as appropriate (02/13/2020);    Time 3    Period Weeks    Status Achieved    Target Date 03/05/20             PT Long Term Goals - 03/19/20 0936      PT LONG TERM GOAL #1   Title Be independent with a long-term home exercise program for self-management of symptoms.    Baseline to be intiated at visit 2 as appropriate (02/13/2020); currently participating when she is not too busy (03/19/2020);    Time 12    Period Weeks    Status Partially Met   TARGET DATE FOR ALL LONG TERM GOALS: 05/07/2020     PT LONG TERM GOAL #2   Title Demonstrate improved FOTO score to equal or greater than 65 by visit 16#  to demonstrate improvement in overall condition and self-reported functional ability.    Baseline 44 (02/13/2020); 60 at visit #10 (03/18/2020);    Time 12    Period Weeks    Status Partially Met      PT LONG TERM GOAL #3   Title Reduce pain with functional activities to equal or less than 1/10 to allow patient to complete usual activities including ADLs, IADLs, and social engagement with less difficulty.    Baseline up to 5/10 (02/13/2020); up to 4/10  (03/18/2020);    Time 12    Period Weeks    Status Partially Met      PT LONG TERM GOAL #4   Title Patient will score equal or greater than 25/30 on functional gait assessment to demonstrate low fall risk.    Baseline 21/30, moderate fall risk (02/13/2020); 26/30, low fall risk (03/18/2020);    Time 12    Period Weeks    Status Achieved      PT LONG TERM GOAL #5   Title Complete community, work and/or recreational activities without limitation due to current condition.    Baseline Functional Limitations: stand up or sit down, walk up and down steps, squat, bend without pain, waling prolonged distances (over 30 min), cleaning, cooking, dressing, picking things up off the ground, sleeping (02/13/2020); stand up or sit down, squatting, bend without pain, walking prolonged distances (over 30-45 min), cleaning (if she has to bend to the floor, picking things up off the ground (03/18/2020);    Time 12    Period Weeks    Status Partially Met                 Plan - 03/21/20 1449    Clinical Impression Statement Patient tolerated treatment well with some complaint of non painful popping in left hip and occasional brief discomfort in hips L > R. Was able  to perform more exercises targeting the adductors. Does continue to report limitations, especially in standing up to walk. Patient would benefit from continued management of limiting condition by skilled physical therapist to address remaining impairments and functional limitations to work towards stated goals and return to PLOF or maximal functional independence.    Personal Factors and Comorbidities Age;Comorbidity 3+;Social Background;Past/Current Experience;Fitness;Time since onset of injury/illness/exacerbation;Other   cognition   Comorbidities Relevant past medical history and comorbidities include hx of pneumonia and acute pericarditis (resolved), GERD, hypothyroidism, peripheral neuropathy in hands from chemo, osteoporosis, hx R breast  cancer (2009), thyroid cancer (2002),colon cancer (afer thyroid cancer and before breast cancer), sinus headaches, cholecystectomy, appendectomy, kidney and gallstones.    Examination-Activity Limitations Bend;Locomotion Level;Lift;Stairs;Squat;Stand;Sleep;Dressing;Transfers;Carry;Hygiene/Grooming    Examination-Participation Restrictions Cleaning;Yard Work;Community Activity;Meal Prep;Shop;Laundry;Other   Functional Limitations: stand up or sit down, walk up and down steps, squat, bend without pain, waling prolonged distances (over 30 min), cleaning, cooking, dressing, picking things up off the ground, sleeping   Stability/Clinical Decision Making Evolving/Moderate complexity    Rehab Potential Good    PT Frequency 2x / week    PT Duration 12 weeks    PT Treatment/Interventions ADLs/Self Care Home Management;Aquatic Therapy;Cryotherapy;Moist Heat;Gait training;Stair training;DME Instruction;Functional mobility training;Balance training;Neuromuscular re-education;Therapeutic exercise;Therapeutic activities;Cognitive remediation;Patient/family education;Manual techniques;Passive range of motion;Dry needling;Scar mobilization;Joint Manipulations;Spinal Manipulations    PT Next Visit Plan progressive glute, LE, and functional strengthening, flexibility, and balance exercises    PT Home Exercise Plan Medbridge Access Code: [email or web address]    Consulted and Agree with Plan of Care Patient           Patient will benefit from skilled therapeutic intervention in order to improve the following deficits and impairments:  Abnormal gait, Decreased cognition, Pain, Improper body mechanics, Increased muscle spasms, Decreased mobility, Decreased activity tolerance, Decreased endurance, Decreased range of motion, Decreased strength, Hypomobility, Impaired perceived functional ability, Impaired flexibility, Difficulty walking, Decreased balance  Visit Diagnosis: Pain in right hip  Pain in left hip  Unsteadiness on  feet     Problem List Patient Active Problem List   Diagnosis Date Noted  . Anemia 02/11/2020  . 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 Rectal bleed 06/17/2019  . Pleural effusion 12/03/2018  . Right hip pain 12/03/2018  . Leukopenia 08/21/2018  . Change in vision 06/24/2018  . Hypothyroidism 05/02/2018  . Neck pain 05/02/2018  . Nonintractable headache 04/18/2018  . Thrush 04/18/2018  . Pleural effusion associated with pulmonary infection 04/18/2018  . Other fatigue 04/18/2018  . Abdominal pain 03/06/2016  . Acute diarrhea 01/10/2016  . Neck nodule 10/13/2015  . Vitamin D deficiency 07/27/2015  . Loose stools 04/14/2015  . Mild depression ([place]) 02/02/2015  . Health care maintenance 06/17/2014  . Stress 01/28/2014  . Acute pericarditis 05/31/2013  . Environmental allergies 02/25/2013  . Left elbow pain 02/25/2013  . Abnormal liver function test 12/07/2012  . Anxiety 05/06/2012  . GERD (gastroesophageal reflux disease) 05/06/2012  . Osteoporosis 05/06/2012  . History of breast cancer 09/18/2008  . History of thyroid cancer 09/18/2008  . Hypercholesterolemia 09/18/2008  . Peripheral neuropathy 09/18/2008  . Palpitations 09/18/2008  . History of malignant neoplasm of large intestine 09/18/2008    Everlean Alstrom. Graylon Good, PT, DPT 03/21/20, 2:51 PM  [facility] [address], [place], [address] Phone: [phone]   Fax:  [phone]  Name: LILIANN FILE MRN: [id number] Date of Birth: 1950/11/14

## 2020-03-25 ENCOUNTER — Encounter: Payer: Self-pay | Admitting: Physical Therapy

## 2020-03-25 ENCOUNTER — Ambulatory Visit: Payer: PPO | Admitting: Physical Therapy

## 2020-03-25 DIAGNOSIS — M25551 Pain in right hip: Secondary | ICD-10-CM | POA: Diagnosis not present

## 2020-03-25 DIAGNOSIS — M1611 Unilateral primary osteoarthritis, right hip: Secondary | ICD-10-CM | POA: Diagnosis not present

## 2020-03-25 DIAGNOSIS — Z96641 Presence of right artificial hip joint: Secondary | ICD-10-CM | POA: Diagnosis not present

## 2020-03-25 DIAGNOSIS — M7062 Trochanteric bursitis, left hip: Secondary | ICD-10-CM | POA: Diagnosis not present

## 2020-03-25 DIAGNOSIS — M7061 Trochanteric bursitis, right hip: Secondary | ICD-10-CM | POA: Diagnosis not present

## 2020-03-25 NOTE — Therapy (Signed)
[facility] [address] [place], [address] Phone: [phone]   Fax:  [phone]  Physical Therapy Treatment  Patient Details  Name: Debra Hale MRN: [id number] Date of Birth: 01-04-1951 Referring Provider (PT):  Odis Hollingshead, MD   Encounter Date: 03/25/2020   PT End of Session - 03/25/20 1422    Visit Number 12    Number of Visits 24    Date for PT Re-Evaluation 05/07/20    Authorization Type [employer] reporting period from 03/21/2020    Progress Note Due on Visit 20    PT Start Time 1345    PT Stop Time 1425    PT Time Calculation (min) 40 min    Equipment Utilized During Treatment Gait belt    Activity Tolerance Patient tolerated treatment well    Behavior During Therapy [facility] for tasks assessed/performed           Past Medical History:  Diagnosis Date  . Anxiety   . Arthritis    Osteoarthritis  . Breast cancer ([place]) 2009   right breast lumpectomy with rad tx  . Colon cancer ([place])    surgery with chemo and rad tx  . Complication of anesthesia   . GERD (gastroesophageal reflux disease)   . Heart palpitations   . History of hiatal hernia   . History of kidney stones   . Hypothyroidism   . Liver disease   . Liver nodule    s/p negative biopsy  . Malignant neoplasm of thyroid gland ([place]) 2002   s/p surgery and XRT  . Osteoporosis   . Other and unspecified hyperlipidemia   . Palpitations   . Personal history of chemotherapy   . Personal history of malignant neoplasm of large intestine    carcinoma - cecum, s/p right laparoscopic colectomy - s/p chemotherapy and XRT  . Personal history of radiation therapy   . Pneumonia 2019  . PONV (postoperative nausea and vomiting)   . Pure hypercholesterolemia   . Unspecified hereditary and idiopathic peripheral neuropathy     Past Surgical History:  Procedure Laterality Date  . APPENDECTOMY  1985  . BREAST BIOPSY Right  2009   positive  . BREAST BIOPSY Right 2009   negative  . BREAST LUMPECTOMY Right 2009   breast cancer  . CHOLECYSTECTOMY  1995  . COLONOSCOPY    . COLONOSCOPY WITH PROPOFOL N/A 10/29/2017   Procedure: COLONOSCOPY WITH PROPOFOL;  Surgeon: Manya Silvas, MD;  Location: [facility] ENDOSCOPY;  Service: Endoscopy;  Laterality: N/A;  . DILATION AND CURETTAGE OF UTERUS  1990  . DILATION AND CURETTAGE, DIAGNOSTIC / THERAPEUTIC  1990  . ESOPHAGOGASTRODUODENOSCOPY    . ESOPHAGOGASTRODUODENOSCOPY (EGD) WITH PROPOFOL N/A 10/29/2017   Procedure: ESOPHAGOGASTRODUODENOSCOPY (EGD) WITH PROPOFOL;  Surgeon: Manya Silvas, MD;  Location: [facility] ENDOSCOPY;  Service: Endoscopy;  Laterality: N/A;  . LAPAROSCOPIC PARTIAL COLECTOMY     stage 3-C carcinoma of the cecum, s/p chemotherapy and xrt  . LITHOTRIPSY    . SIGMOIDOSCOPY  08/26/1993  . THYROID LOBECTOMY  2002   s/p XRT  . TOTAL HIP ARTHROPLASTY Right 10/24/2019   Procedure: TOTAL HIP ARTHROPLASTY;  Surgeon: Corky Mull, MD;  Location: [facility] ORS;  Service: Orthopedics;  Laterality: Right;    There were no vitals filed for this visit.   Subjective Assessment - 03/25/20 1347    Subjective Patient reports she has had a lot of pain at the anterior hips  all weekend when she stands up. She feels like she had pain in the anterior hips possibly related to the cossack squat last session. She reports pain at both groin regions L > R up to 3/10 when she stands upon arrival. Saw Dr. Roland Rack today who did radiographs of bilateral hips and did not find degenerative changes in the left side. He found positive FADIR but not FABER at left hip and is concerned for labral tear, so he has ordered and MRI and advised to continue PT.    Pertinent History Patient is a 69 y.o. female who presents to outpatient physical therapy with a referral for medical diagnosis s/p R THA, R and L trochanteric bursitis. Physician specifically requested IT band stretching. This patient's chief  complaints consist of B hip pain following R THA on 10/24/2019 leading to the following functional deficits: difficulty stand up or sit down, walk up and down steps, squat, bend without pain, waling prolonged distances (over 30 min), cleaning, cooking, dressing, picking things up off the ground, sleeping. Relevant past medical history and comorbidities include hx of pneumonia and acute pericarditis (resolved), GERD, hypothyroidism, peripheral neuropathy in hands from chemo, osteoporosis, hx R breast cancer (2009), thyroid cancer (2002),colon cancer (afer thyroid cancer and before breast cancer), sinus headaches, cholecystectomy, appendectomy, kidney and gallstones.  Patient denies hx stroke, seizures, lung problem, major cardiac events, diabetes, unexplained weight loss, changes in bowel or bladder problems recently, new onset stumbling or dropping things.    Limitations Lifting;Standing;Walking;House hold activities   Functional Limitations: stand up or sit down, walk up and down steps, squat, bend without pain, waling prolonged distances (over 30 min), cleaning, cooking, dressing, picking things up off the ground, sleeping.   How long can you sit comfortably? "longer I sit the more it hurts when I get up"    How long can you stand comfortably? 15 min    How long can you walk comfortably? 30-45 min at the store    Patient Stated Goals "to not have any more pain"    Currently in Pain? Yes    Pain Score 3     Pain Location Hip    Pain Orientation Right;Left;Anterior    Pain Descriptors / Indicators Aching            TREATMENT: Denies sensitivity to latex  Therapeutic exercise:to centralize symptoms and improve ROM, strength, muscular endurance, and activity tolerance required for successful completion of functional activities. -NuStep level3using bilateral lower extremities. Seat setting 8. For improved extremity mobility, muscular endurance, and activity tolerance; and to induce the analgesic  effect of aerobic exercise, stimulate improved joint nutrition, and prepare body structures and systems for following interventions. x35mnutes. Average SPM =82 (manual therapy - see below) - standing hip abduction with one knee on rolling stool with B UE support, 3x10 each side.  - Education on HEP including handout  Manual therapy: to reduce pain and tissue tension, improve range of motion, neuromodulation, in order to promote improved ability to complete functional activities. - hooklying STM to bilateral hip adductors to decrease tightness and tenderness. Noted for quite significant tension and tenderness in L side and spent more time on this side.  -hooklying PROM frog stretch to the hip adductors, 3x30 seconds each side with PT overpressure to tolerance.   Pt required multimodal cuing for proper technique and to facilitate improved neuromuscular control, strength, range of motion, and functional ability resulting in improved performance and form.  HOME EXERCISE PROGRAM Access Code: [email or web address]:  [email or web address] Date: 02/15/2020 Prepared by: Rosita Kea  Exercises ITB Stretch at Wall - 1 x daily - 3 reps - 30 seconds hold ITB Stretch at Heath - 1 x daily - 3 reps - 30 seconds hold Bridge - 1 x daily - 3 sets - 10 reps - 5 seconds hold     PT Education - 03/25/20 1423    Education Details Exercise purpose/form. Self management techniques. anatomy and physiology    Person(s) Educated Patient    Methods Explanation;Tactile cues;Demonstration;Verbal cues;Handout    Comprehension Verbalized understanding;Returned demonstration;Verbal cues required;Tactile cues required;Need further instruction            PT Short Term Goals - 03/19/20 0936      PT SHORT TERM GOAL #1   Title Be independent with initial home exercise program for self-management of symptoms.    Baseline to be intiated at visit 2 as appropriate (02/13/2020);    Time 3    Period Weeks     Status Achieved    Target Date 03/05/20             PT Long Term Goals - 03/19/20 0936      PT LONG TERM GOAL #1   Title Be independent with a long-term home exercise program for self-management of symptoms.    Baseline to be intiated at visit 2 as appropriate (02/13/2020); currently participating when she is not too busy (03/19/2020);    Time 12    Period Weeks    Status Partially Met   TARGET DATE FOR ALL LONG TERM GOALS: 05/07/2020     PT LONG TERM GOAL #2   Title Demonstrate improved FOTO score to equal or greater than 65 by visit 16#  to demonstrate improvement in overall condition and self-reported functional ability.    Baseline 44 (02/13/2020); 60 at visit #10 (03/18/2020);    Time 12    Period Weeks    Status Partially Met      PT LONG TERM GOAL #3   Title Reduce pain with functional activities to equal or less than 1/10 to allow patient to complete usual activities including ADLs, IADLs, and social engagement with less difficulty.    Baseline up to 5/10 (02/13/2020); up to 4/10 (03/18/2020);    Time 12    Period Weeks    Status Partially Met      PT LONG TERM GOAL #4   Title Patient will score equal or greater than 25/30 on functional gait assessment to demonstrate low fall risk.    Baseline 21/30, moderate fall risk (02/13/2020); 26/30, low fall risk (03/18/2020);    Time 12    Period Weeks    Status Achieved      PT LONG TERM GOAL #5   Title Complete community, work and/or recreational activities without limitation due to current condition.    Baseline Functional Limitations: stand up or sit down, walk up and down steps, squat, bend without pain, waling prolonged distances (over 30 min), cleaning, cooking, dressing, picking things up off the ground, sleeping (02/13/2020); stand up or sit down, squatting, bend without pain, walking prolonged distances (over 30-45 min), cleaning (if she has to bend to the floor, picking things up off the ground (03/18/2020);    Time  12    Period Weeks    Status Partially Met                 Plan - 03/25/20 1423    Clinical Impression Statement Patient tolerated treatment  well overall and reported improved comfort with exercises after manual therapy. Focused on adductor region due to it's possible effect on groin pain. Did have more tightness and pain in the left adductors compared to the right. Patient would benefit from continued management of limiting condition by skilled physical therapist to address remaining impairments and functional limitations to work towards stated goals and return to PLOF or maximal functional independence.    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    Examination-Activity Limitations Bend;Locomotion Level;Lift;Stairs;Squat;Stand;Sleep;Dressing;Transfers;Carry;Hygiene/Grooming    Examination-Participation Restrictions Cleaning;Yard Work;Community Activity;Meal Prep;Shop;Laundry;Other   Functional Limitations: stand up or sit down, walk up and down steps, squat, bend without pain, waling prolonged distances (over 30 min), cleaning, cooking, dressing, picking things up off the ground, sleeping   Stability/Clinical Decision Making Evolving/Moderate complexity    Rehab Potential Good    PT Frequency 2x / week    PT Duration 12 weeks    PT Treatment/Interventions ADLs/Self Care Home Management;Aquatic Therapy;Cryotherapy;Moist Heat;Gait training;Stair training;DME Instruction;Functional mobility  training;Balance training;Neuromuscular re-education;Therapeutic exercise;Therapeutic activities;Cognitive remediation;Patient/family education;Manual techniques;Passive range of motion;Dry needling;Scar mobilization;Joint Manipulations;Spinal Manipulations    PT Next Visit Plan progressive glute, LE, and functional strengthening, flexibility, and balance exercises    PT Home Exercise Plan Medbridge Access Code: [email or web address]    Consulted and Agree with Plan of Care Patient           Patient will benefit from skilled therapeutic intervention in order to improve the following deficits and impairments:  Abnormal gait, Decreased cognition, Pain, Improper body mechanics, Increased muscle spasms, Decreased mobility, Decreased activity tolerance, Decreased endurance, Decreased range of motion, Decreased strength, Hypomobility, Impaired perceived functional ability, Impaired flexibility, Difficulty walking, Decreased balance  Visit Diagnosis: Pain in right hip  Pain in left hip  Unsteadiness on feet     Problem List Patient Active Problem List   Diagnosis Date Noted  . Anemia 02/11/2020  . 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 Rectal bleed 06/17/2019  . Pleural effusion 12/03/2018  . Right hip pain 12/03/2018  . Leukopenia 08/21/2018  . Change in vision 06/24/2018  . Hypothyroidism 05/02/2018  . Neck pain 05/02/2018  . Nonintractable headache 04/18/2018  . Thrush 04/18/2018  . Pleural effusion associated with pulmonary infection 04/18/2018  . Other fatigue 04/18/2018  . Abdominal pain 03/06/2016  . Acute diarrhea 01/10/2016  . Neck nodule 10/13/2015  . Vitamin D deficiency 07/27/2015  . Loose stools 04/14/2015  . Mild depression ([place]) 02/02/2015  . Health care maintenance 06/17/2014  . Stress 01/28/2014  . Acute pericarditis 05/31/2013  . Environmental allergies 02/25/2013  . Left elbow pain 02/25/2013  . Abnormal liver function test 12/07/2012  . Anxiety  05/06/2012  . GERD (gastroesophageal reflux disease) 05/06/2012  . Osteoporosis 05/06/2012  . History of breast cancer 09/18/2008  . History of thyroid cancer 09/18/2008  . Hypercholesterolemia 09/18/2008  . Peripheral neuropathy 09/18/2008  . Palpitations 09/18/2008  . History of malignant neoplasm of large intestine 09/18/2008    Everlean Alstrom. Graylon Good, PT, DPT 03/25/20, 2:31 PM  [facility] [address], [place], [address] Phone: [phone]  Fax:  [phone]  Name: AVANNA SOWDER MRN: [id number] Date of Birth: [birth year]

## 2020-03-27 ENCOUNTER — Encounter: Payer: Self-pay | Admitting: Physical Therapy

## 2020-03-27 ENCOUNTER — Other Ambulatory Visit: Payer: Self-pay

## 2020-03-27 ENCOUNTER — Ambulatory Visit: Payer: PPO | Admitting: Physical Therapy

## 2020-03-27 DIAGNOSIS — M25551 Pain in right hip: Secondary | ICD-10-CM | POA: Diagnosis not present

## 2020-03-27 DIAGNOSIS — M25552 Pain in left hip: Secondary | ICD-10-CM

## 2020-03-27 DIAGNOSIS — R2681 Unsteadiness on feet: Secondary | ICD-10-CM

## 2020-03-27 NOTE — Therapy (Signed)
[facility] [address], [place], [address] Phone: [phone]   Fax:  [phone]  Physical Therapy Treatment  Patient Details  Name: Debra Hale MRN: [id number] Date of Birth: 05-Sep-1950 Referring Provider (PT):  Odis Hollingshead, MD   Encounter Date: 03/27/2020   PT End of Session - 03/27/20 1358    Visit Number 13    Number of Visits 24    Date for PT Re-Evaluation 05/07/20    Authorization Type [employer] reporting period from 03/21/2020    Progress Note Due on Visit 20    PT Start Time 1350    PT Stop Time 1430    PT Time Calculation (min) 40 min    Equipment Utilized During Treatment Gait belt    Activity Tolerance Patient tolerated treatment well    Behavior During Therapy [facility] for tasks assessed/performed           Past Medical History:  Diagnosis Date  . Anxiety   . Arthritis    Osteoarthritis  . Breast cancer ([place]) 2009   right breast lumpectomy with rad tx  . Colon cancer ([place])    surgery with chemo and rad tx  . Complication of anesthesia   . GERD (gastroesophageal reflux disease)   . Heart palpitations   . History of hiatal hernia   . History of kidney stones   . Hypothyroidism   . Liver disease   . Liver nodule    s/p negative biopsy  . Malignant neoplasm of thyroid gland ([place]) 2002   s/p surgery and XRT  . Osteoporosis   . Other and unspecified hyperlipidemia   . Palpitations   . Personal history of chemotherapy   . Personal history of malignant neoplasm of large intestine    carcinoma - cecum, s/p right laparoscopic colectomy - s/p chemotherapy and XRT  . Personal history of radiation therapy   . Pneumonia 2019  . PONV (postoperative nausea and vomiting)   . Pure hypercholesterolemia   . Unspecified hereditary and idiopathic peripheral neuropathy     Past Surgical History:  Procedure Laterality Date  . APPENDECTOMY  1985  . BREAST BIOPSY Right  2009   positive  . BREAST BIOPSY Right 2009   negative  . BREAST LUMPECTOMY Right 2009   breast cancer  . CHOLECYSTECTOMY  1995  . COLONOSCOPY    . COLONOSCOPY WITH PROPOFOL N/A 10/29/2017   Procedure: COLONOSCOPY WITH PROPOFOL;  Surgeon: Manya Silvas, MD;  Location: [facility] ENDOSCOPY;  Service: Endoscopy;  Laterality: N/A;  . DILATION AND CURETTAGE OF UTERUS  1990  . DILATION AND CURETTAGE, DIAGNOSTIC / THERAPEUTIC  1990  . ESOPHAGOGASTRODUODENOSCOPY    . ESOPHAGOGASTRODUODENOSCOPY (EGD) WITH PROPOFOL N/A 10/29/2017   Procedure: ESOPHAGOGASTRODUODENOSCOPY (EGD) WITH PROPOFOL;  Surgeon: Manya Silvas, MD;  Location: [facility] ENDOSCOPY;  Service: Endoscopy;  Laterality: N/A;  . LAPAROSCOPIC PARTIAL COLECTOMY     stage 3-C carcinoma of the cecum, s/p chemotherapy and xrt  . LITHOTRIPSY    . SIGMOIDOSCOPY  08/26/1993  . THYROID LOBECTOMY  2002   s/p XRT  . TOTAL HIP ARTHROPLASTY Right 10/24/2019   Procedure: TOTAL HIP ARTHROPLASTY;  Surgeon: Corky Mull, MD;  Location: [facility] ORS;  Service: Orthopedics;  Laterality: Right;    There were no vitals filed for this visit.   Subjective Assessment - 03/27/20 1356    Subjective Pateint reports she feels strongly that what was done at last PT session  really helped. She states she still has intermittant pain at the groin L > R but the quality is different. She can walk more but still got the pain sharply for a second in the left groin when she walked fast to get into the clinic.    Pertinent History Patient is a 69 y.o. female who presents to outpatient physical therapy with a referral for medical diagnosis s/p R THA, R and L trochanteric bursitis. Physician specifically requested IT band stretching. This patient's chief complaints consist of B hip pain following R THA on 10/24/2019 leading to the following functional deficits: difficulty stand up or sit down, walk up and down steps, squat, bend without pain, waling prolonged distances (over 30 min),  cleaning, cooking, dressing, picking things up off the ground, sleeping. Relevant past medical history and comorbidities include hx of pneumonia and acute pericarditis (resolved), GERD, hypothyroidism, peripheral neuropathy in hands from chemo, osteoporosis, hx R breast cancer (2009), thyroid cancer (2002),colon cancer (afer thyroid cancer and before breast cancer), sinus headaches, cholecystectomy, appendectomy, kidney and gallstones.  Patient denies hx stroke, seizures, lung problem, major cardiac events, diabetes, unexplained weight loss, changes in bowel or bladder problems recently, new onset stumbling or dropping things.    Limitations Lifting;Standing;Walking;House hold activities   Functional Limitations: stand up or sit down, walk up and down steps, squat, bend without pain, waling prolonged distances (over 30 min), cleaning, cooking, dressing, picking things up off the ground, sleeping.   How long can you sit comfortably? "longer I sit the more it hurts when I get up"    How long can you stand comfortably? 15 min    How long can you walk comfortably? 30-45 min at the store    Patient Stated Goals "to not have any more pain"    Currently in Pain? Yes    Pain Score 2             TREATMENT: Denies sensitivity to latex  Therapeutic exercise:to centralize symptoms and improve ROM, strength, muscular endurance, and activity tolerance required for successful completion of functional activities. -NuStep level3using bilateral lower extremities. Seat setting 8. For improved extremity mobility, muscular endurance, and activity tolerance; and to induce the analgesic effect of aerobic exercise, stimulate improved joint nutrition, and prepare body structures and systems for following interventions. x3mnutes. Average SPM =78  (manual therapy - see below)  - standing hip abduction with one knee on rolling stool with B UE support,3x10 each side.   Manual therapy: to reduce pain and tissue  tension, improve range of motion, neuromodulation, in order to promote improved ability to complete functional activities. - hooklying STM to bilateral hip adductors to decrease tightness and tenderness. Noted for quite significant tension and tenderness in L side and spent more time on this side.  -hooklying PROM frog stretch to the hip adductors, 3x30 seconds each side with PT overpressure to tolerance.  - supine L hip adductor stretch with knee extended, PROM, 3x30 seconds  Pt required multimodal cuing for proper technique and to facilitate improved neuromuscular control, strength, range of motion, and functional ability resulting in improved performance and form.  HOME EXERCISE PROGRAM Access Code: [email or web address]: [email or web address] Date: 02/15/2020 Prepared by: SRosita Kea Exercises ITB Stretch at Wall - 1 x daily - 3 reps - 30 seconds hold ITB Stretch at Wall - 1 x daily - 3 reps - 30 seconds hold Bridge - 1 x daily - 3 sets - 10 reps - 5 seconds hold  PT Education - 03/27/20 1358    Education Details Exercise purpose/form. Self management techniques.    Person(s) Educated Patient    Methods Explanation;Demonstration;Tactile cues;Verbal cues    Comprehension Verbalized understanding;Returned demonstration;Verbal cues required;Tactile cues required;Need further instruction            PT Short Term Goals - 03/19/20 0936      PT SHORT TERM GOAL #1   Title Be independent with initial home exercise program for self-management of symptoms.    Baseline to be intiated at visit 2 as appropriate (02/13/2020);    Time 3    Period Weeks    Status Achieved    Target Date 03/05/20             PT Long Term Goals - 03/19/20 0936      PT LONG TERM GOAL #1   Title Be independent with a long-term home exercise program for self-management of symptoms.    Baseline to be intiated at visit 2 as appropriate (02/13/2020); currently participating when she is not too  busy (03/19/2020);    Time 12    Period Weeks    Status Partially Met   TARGET DATE FOR ALL LONG TERM GOALS: 05/07/2020     PT LONG TERM GOAL #2   Title Demonstrate improved FOTO score to equal or greater than 65 by visit 16#  to demonstrate improvement in overall condition and self-reported functional ability.    Baseline 44 (02/13/2020); 60 at visit #10 (03/18/2020);    Time 12    Period Weeks    Status Partially Met      PT LONG TERM GOAL #3   Title Reduce pain with functional activities to equal or less than 1/10 to allow patient to complete usual activities including ADLs, IADLs, and social engagement with less difficulty.    Baseline up to 5/10 (02/13/2020); up to 4/10 (03/18/2020);    Time 12    Period Weeks    Status Partially Met      PT LONG TERM GOAL #4   Title Patient will score equal or greater than 25/30 on functional gait assessment to demonstrate low fall risk.    Baseline 21/30, moderate fall risk (02/13/2020); 26/30, low fall risk (03/18/2020);    Time 12    Period Weeks    Status Achieved      PT LONG TERM GOAL #5   Title Complete community, work and/or recreational activities without limitation due to current condition.    Baseline Functional Limitations: stand up or sit down, walk up and down steps, squat, bend without pain, waling prolonged distances (over 30 min), cleaning, cooking, dressing, picking things up off the ground, sleeping (02/13/2020); stand up or sit down, squatting, bend without pain, walking prolonged distances (over 30-45 min), cleaning (if she has to bend to the floor, picking things up off the ground (03/18/2020);    Time 12    Period Weeks    Status Partially Met                 Plan - 03/27/20 1426    Clinical Impression Statement Patient tolerated treatment well overall and continued to be tight and tender in the hip adductors L > R. Focused on similar intervention today due to good response to last treatment session. Patient would  benefit from continued management of limiting condition by skilled physical therapist to address remaining impairments and functional limitations to work towards stated goals and return to PLOF or maximal functional  independence.    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    Examination-Activity Limitations Bend;Locomotion Level;Lift;Stairs;Squat;Stand;Sleep;Dressing;Transfers;Carry;Hygiene/Grooming    Examination-Participation Restrictions Cleaning;Yard Work;Community Activity;Meal Prep;Shop;Laundry;Other   Functional Limitations: stand up or sit down, walk up and down steps, squat, bend without pain, waling prolonged distances (over 30 min), cleaning, cooking, dressing, picking things up off the ground, sleeping   Stability/Clinical Decision Making Evolving/Moderate complexity    Rehab Potential Good    PT Frequency 2x / week    PT Duration 12 weeks    PT Treatment/Interventions ADLs/Self Care Home Management;Aquatic Therapy;Cryotherapy;Moist Heat;Gait training;Stair training;DME Instruction;Functional mobility training;Balance training;Neuromuscular re-education;Therapeutic exercise;Therapeutic activities;Cognitive remediation;Patient/family education;Manual techniques;Passive range of motion;Dry needling;Scar mobilization;Joint Manipulations;Spinal Manipulations    PT Next Visit Plan progressive glute, LE, and functional strengthening, flexibility, and balance exercises    PT Home Exercise Plan  Medbridge Access Code: [email or web address]    Consulted and Agree with Plan of Care Patient           Patient will benefit from skilled therapeutic intervention in order to improve the following deficits and impairments:  Abnormal gait, Decreased cognition, Pain, Improper body mechanics, Increased muscle spasms, Decreased mobility, Decreased activity tolerance, Decreased endurance, Decreased range of motion, Decreased strength, Hypomobility, Impaired perceived functional ability, Impaired flexibility, Difficulty walking, Decreased balance  Visit Diagnosis: Pain in right hip  Pain in left hip  Unsteadiness on feet     Problem List Patient Active Problem List   Diagnosis Date Noted  . Anemia 02/11/2020  . 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 Rectal bleed 06/17/2019  . Pleural effusion 12/03/2018  . Right hip pain 12/03/2018  . Leukopenia 08/21/2018  . Change in vision 06/24/2018  . Hypothyroidism 05/02/2018  . Neck pain 05/02/2018  . Nonintractable headache 04/18/2018  . Thrush 04/18/2018  . Pleural effusion associated with pulmonary infection 04/18/2018  . Other fatigue 04/18/2018  . Abdominal pain 03/06/2016  . Acute diarrhea 01/10/2016  . Neck nodule 10/13/2015  . Vitamin D deficiency 07/27/2015  . Loose stools 04/14/2015  . Mild depression ([place]) 02/02/2015  . Health care maintenance 06/17/2014  . Stress 01/28/2014  . Acute pericarditis 05/31/2013  . Environmental allergies 02/25/2013  . Left elbow pain 02/25/2013  . Abnormal liver function test 12/07/2012  . Anxiety 05/06/2012  . GERD (gastroesophageal reflux disease) 05/06/2012  . Osteoporosis 05/06/2012  . History of breast cancer 09/18/2008  . History of thyroid cancer 09/18/2008  . Hypercholesterolemia 09/18/2008  . Peripheral neuropathy 09/18/2008  . Palpitations 09/18/2008  . History of malignant neoplasm of large intestine 09/18/2008    Everlean Alstrom. Graylon Good, PT, DPT 03/27/20, 2:30 PM  [facility] [address] [place], [address] Phone: [phone]   Fax:  [phone]  Name: CAMESHIA CRESSMAN MRN: [id number] Date of Birth: [birth year]

## 2020-03-27 NOTE — Progress Notes (Signed)
[facility]  [phone] [phone]  ID: Debra Hale OB: 05/07/1950  MR#: [id number]  SWN#:[id number]  Patient Care Team: [name], MD as PCP - General (Internal Medicine) Wellington Hampshire, MD as PCP - Cardiology (Cardiology)  I connected with Debra Hale on 04/02/20 at  2:15 PM EST by video enabled telemedicine visit and verified that I am speaking with the correct person using two identifiers.   I discussed the limitations, risks, security and privacy concerns of performing an evaluation and management service by telemedicine and the availability of in-person appointments. I also discussed with the patient that there may be a patient responsible charge related to this service. The patient expressed understanding and agreed to proceed.   Other persons participating in the visit and their role in the encounter: Patient, MD.  Patient's location: Home. Provider's location: Clinic.  CHIEF COMPLAINT: Leukopenia.  INTERVAL HISTORY: Patient agreed to video assisted telemedicine visit for further evaluation and discussion of her laboratory results. She continues to be anxious, but otherwise feels well.  She has no neurologic complaints.  She denies any recent fevers or illnesses.  She has a good appetite and denies weight loss.  She has no chest pain, shortness of breath, cough, or hemoptysis.  She denies any nausea, vomiting, constipation, or diarrhea.  She has no urinary complaints. Patient offers no further specific complaints today.  REVIEW OF SYSTEMS:   Review of Systems  Constitutional: Negative.  Negative for fever, malaise/fatigue and weight loss.  Respiratory: Negative.  Negative for cough, hemoptysis and shortness of breath.   Cardiovascular: Negative.  Negative for chest pain and leg swelling.  Gastrointestinal: Negative.  Negative for abdominal pain.  Genitourinary: Negative.  Negative for dysuria.  Musculoskeletal: Negative.   Negative for back pain.  Skin: Negative.  Negative for rash.  Neurological: Negative.  Negative for dizziness, focal weakness, weakness and headaches.  Psychiatric/Behavioral: The patient is nervous/anxious.     As per HPI. Otherwise, a complete review of systems is negative.  PAST MEDICAL HISTORY: Past Medical History:  Diagnosis Date  . Anxiety   . Arthritis    Osteoarthritis  . Breast cancer ([place]) 2009   right breast lumpectomy with rad tx  . Colon cancer ([place])    surgery with chemo and rad tx  . Complication of anesthesia   . GERD (gastroesophageal reflux disease)   . Heart palpitations   . History of hiatal hernia   . History of kidney stones   . Hypothyroidism   . Liver disease   . Liver nodule    s/p negative biopsy  . Malignant neoplasm of thyroid gland ([place]) 2002   s/p surgery and XRT  . Osteoporosis   . Other and unspecified hyperlipidemia   . Palpitations   . Personal history of chemotherapy   . Personal history of malignant neoplasm of large intestine    carcinoma - cecum, s/p right laparoscopic colectomy - s/p chemotherapy and XRT  . Personal history of radiation therapy   . Pneumonia 2019  . PONV (postoperative nausea and vomiting)   . Pure hypercholesterolemia   . Unspecified hereditary and idiopathic peripheral neuropathy     PAST SURGICAL HISTORY: Past Surgical History:  Procedure Laterality Date  . APPENDECTOMY  1985  . BREAST BIOPSY Right 2009   positive  . BREAST BIOPSY Right 2009   negative  . BREAST LUMPECTOMY Right 2009   breast cancer  . CHOLECYSTECTOMY  1995  . COLONOSCOPY    .  COLONOSCOPY WITH PROPOFOL N/A 10/29/2017   Procedure: COLONOSCOPY WITH PROPOFOL;  Surgeon: Manya Silvas, MD;  Location: [facility] ENDOSCOPY;  Service: Endoscopy;  Laterality: N/A;  . DILATION AND CURETTAGE OF UTERUS  1990  . DILATION AND CURETTAGE, DIAGNOSTIC / THERAPEUTIC  1990  . ESOPHAGOGASTRODUODENOSCOPY    . ESOPHAGOGASTRODUODENOSCOPY (EGD) WITH PROPOFOL  N/A 10/29/2017   Procedure: ESOPHAGOGASTRODUODENOSCOPY (EGD) WITH PROPOFOL;  Surgeon: Manya Silvas, MD;  Location: [facility] ENDOSCOPY;  Service: Endoscopy;  Laterality: N/A;  . LAPAROSCOPIC PARTIAL COLECTOMY     stage 3-C carcinoma of the cecum, s/p chemotherapy and xrt  . LITHOTRIPSY    . SIGMOIDOSCOPY  08/26/1993  . THYROID LOBECTOMY  2002   s/p XRT  . TOTAL HIP ARTHROPLASTY Right 10/24/2019   Procedure: TOTAL HIP ARTHROPLASTY;  Surgeon: Corky Mull, MD;  Location: [facility] ORS;  Service: Orthopedics;  Laterality: Right;    FAMILY HISTORY: Family History  Problem Relation Age of Onset  . Stroke Mother        55s  . Alzheimer's disease Mother   . Cancer Mother   . Lung cancer Father   . Prostate cancer Father   . Cancer Father        Colon  . Colon cancer Father   . Breast cancer Sister        67's  . Lung cancer Sister   . Breast cancer Maternal Aunt     ADVANCED DIRECTIVES (Y/N):  N  HEALTH MAINTENANCE: Social History   Tobacco Use  . Smoking status: Never Smoker  . Smokeless tobacco: Never Used  Vaping Use  . Vaping Use: Never used  Substance Use Topics  . Alcohol use: No    Alcohol/week: 0.0 standard drinks  . Drug use: No     Colonoscopy:  PAP:  Bone density:  Lipid panel:  Allergies  Allergen Reactions  . Demeclocycline Other (See Comments)    Throat swells  . Tetracyclines & Related Other (See Comments)    Throat swells   . Augmentin [Amoxicillin-Pot Clavulanate] Diarrhea  . Bentyl [Dicyclomine Hcl]     unkn  . Ciprofloxacin Diarrhea  . Codeine Other (See Comments)    dizziness    . Epinephrine     Speeds heart up - panic   . Flagyl [Metronidazole] Nausea And Vomiting  . Librax [Chlordiazepoxide-Clidinium]     unkn  . Phenobarbital     unkn  . Prednisone     Nervous    . Ultram [Tramadol] Other (See Comments)    Sick feeling    Current Outpatient Medications  Medication Sig Dispense Refill  . Azelastine-Fluticasone (DYMISTA)  137-50 MCG/ACT SUSP 2 sprays each nostril q day 23 g 2  . clonazePAM (KLONOPIN) 0.5 MG tablet Take 1.5 mg by mouth 2 (two) times daily.   0  . colestipol (COLESTID) 1 g tablet Take 1 g by mouth every morning. TAKES FOR DIARRHEA    . escitalopram (LEXAPRO) 20 MG tablet Take 20 mg by mouth every morning.     Marland Kitchen ibuprofen (ADVIL) 200 MG tablet Take 200 mg by mouth every 8 (eight) hours as needed for moderate pain.    Marland Kitchen ibuprofen (ADVIL,MOTRIN) 600 MG tablet Take 1 tablet (600 mg total) by mouth every 6 (six) hours as needed. 30 tablet 0  . levothyroxine (SYNTHROID) 125 MCG tablet Take 1 tablet (125 mcg total) by mouth daily. 30 tablet 2  . magnesium oxide (MAG-OX) 400 MG tablet TAKE 1 TABLET BY MOUTH EVERY DAY  90 tablet 1  . pantoprazole (PROTONIX) 40 MG tablet TAKE 1 TABLET BY MOUTH EVERY DAY (Patient taking differently: Take 40 mg by mouth every morning. ) 90 tablet 2  . potassium chloride (KLOR-CON) 10 MEQ tablet TAKE 1 TABLET BY MOUTH EVERY DAY 90 tablet 1   Current Facility-Administered Medications  Medication Dose Route Frequency Provider Last Rate Last Admin  . ondansetron (ZOFRAN) tablet 8 mg  8 mg Oral Once Guse, Jacquelynn Cree, FNP        OBJECTIVE: There were no vitals filed for this visit.   There is no height or weight on file to calculate BMI.    ECOG FS:0 - Asymptomatic  General: Well-developed, well-nourished, no acute distress. HEENT: Normocephalic. Neuro: Alert, answering all questions appropriately. Cranial nerves grossly intact. Psych: Normal affect.   LAB RESULTS:  Lab Results  Component Value Date   NA 143 02/20/2020   K 3.3 (L) 04/02/2020   CL 105 02/20/2020   CO2 29 02/20/2020   GLUCOSE 98 02/20/2020   BUN 13 02/20/2020   CREATININE 0.98 02/20/2020   CALCIUM 8.9 02/20/2020   PROT 7.1 02/20/2020   ALBUMIN 3.9 02/20/2020   AST 12 02/20/2020   ALT 20 02/20/2020   ALKPHOS 111 02/20/2020   BILITOT 0.7 02/20/2020   GFRNONAA 47 (L) 10/26/2019   GFRAA 54 (L)  10/26/2019    Lab Results  Component Value Date   WBC 1.7 (L) 03/07/2020   NEUTROABS 0.9 (L) 03/07/2020   HGB 12.9 03/07/2020   HCT 38.6 03/07/2020   MCV 81.4 03/07/2020   PLT 186 03/07/2020     STUDIES: MM 3D SCREEN BREAST BILATERAL  Result Date: 03/07/2020 CLINICAL DATA:  Screening. EXAM: DIGITAL SCREENING BILATERAL MAMMOGRAM WITH TOMO AND CAD COMPARISON:  Previous exam(s). ACR Breast Density Category c: The breast tissue is heterogeneously dense, which may obscure small masses. FINDINGS: There are no findings suspicious for malignancy. Images were processed with CAD. IMPRESSION: No mammographic evidence of malignancy. A result letter of this screening mammogram will be mailed directly to the patient. RECOMMENDATION: Screening mammogram in one year. (Code:SM-B-01Y) BI-RADS CATEGORY  1: Negative. Electronically Signed   By: Lajean Manes M.D.   On: 03/07/2020 09:31    ASSESSMENT: Leukopenia.  PLAN:    1. Leukopenia: Patient's most recent white blood cell count has declined slightly and is now 1.7.  Iron stores, B12, and folate are all within normal limits. Antineutrophil antibodies are positive and may be contributing. Her peripheral blood flow cytometry revealed 6% aberrant myeloblasts concerning for underlying MDS or possibly acute leukemia. Patient has agreed to bone marrow biopsy in the next 1 to 2 weeks for further evaluation. Return to clinic 1 week after the biopsy to discuss the results.  2. 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 Patient completed 5 years of hormonal therapy in approximately June 2015. Currently, she has no evidence of disease.  Her most recent mammogram on March 05, 2020 was reported as BI-RADS 1.  Repeat in 2022.  I provided 30 minutes of face-to-face video visit time during this encounter  which included chart review, counseling, and coordination of care as documented above.   Patient expressed understanding and was in agreement with this plan. She also understands that She can call clinic at any time with any questions, concerns, or complaints.   Cancer Staging History of breast  cancer Staging form: Breast, AJCC 7th Edition - Clinical stage from 01/07/2016: Stage IA (T1c, N0, M0) - Signed by Lloyd Huger, MD on 01/07/2016   Lloyd Huger, MD   04/02/2020 3:43 PM

## 2020-04-01 ENCOUNTER — Other Ambulatory Visit: Payer: Self-pay

## 2020-04-01 ENCOUNTER — Ambulatory Visit: Payer: PPO | Admitting: Physical Therapy

## 2020-04-01 DIAGNOSIS — M25552 Pain in left hip: Secondary | ICD-10-CM

## 2020-04-01 DIAGNOSIS — M25551 Pain in right hip: Secondary | ICD-10-CM

## 2020-04-01 DIAGNOSIS — R2681 Unsteadiness on feet: Secondary | ICD-10-CM

## 2020-04-01 NOTE — Therapy (Signed)
[facility] [address], [place], [address] Phone: [phone]   Fax:  [phone]  Physical Therapy Treatment  Patient Details  Name: Debra Hale MRN: [id number] Date of Birth: 1950/10/16 Referring Provider (PT):  Odis Hollingshead, MD   Encounter Date: 04/01/2020   PT End of Session - 04/01/20 1356    Visit Number 14    Number of Visits 24    Date for PT Re-Evaluation 05/07/20    Authorization Type [employer] reporting period from 03/21/2020    Progress Note Due on Visit 20    PT Start Time 1345    PT Stop Time 1425    PT Time Calculation (min) 40 min    Equipment Utilized During Treatment Gait belt    Activity Tolerance Patient tolerated treatment well    Behavior During Therapy [facility] for tasks assessed/performed           Past Medical History:  Diagnosis Date  . Anxiety   . Arthritis    Osteoarthritis  . Breast cancer ([place]) 2009   right breast lumpectomy with rad tx  . Colon cancer ([place])    surgery with chemo and rad tx  . Complication of anesthesia   . GERD (gastroesophageal reflux disease)   . Heart palpitations   . History of hiatal hernia   . History of kidney stones   . Hypothyroidism   . Liver disease   . Liver nodule    s/p negative biopsy  . Malignant neoplasm of thyroid gland ([place]) 2002   s/p surgery and XRT  . Osteoporosis   . Other and unspecified hyperlipidemia   . Palpitations   . Personal history of chemotherapy   . Personal history of malignant neoplasm of large intestine    carcinoma - cecum, s/p right laparoscopic colectomy - s/p chemotherapy and XRT  . Personal history of radiation therapy   . Pneumonia 2019  . PONV (postoperative nausea and vomiting)   . Pure hypercholesterolemia   . Unspecified hereditary and idiopathic peripheral neuropathy     Past Surgical History:  Procedure Laterality Date  . APPENDECTOMY  1985  . BREAST BIOPSY Right  2009   positive  . BREAST BIOPSY Right 2009   negative  . BREAST LUMPECTOMY Right 2009   breast cancer  . CHOLECYSTECTOMY  1995  . COLONOSCOPY    . COLONOSCOPY WITH PROPOFOL N/A 10/29/2017   Procedure: COLONOSCOPY WITH PROPOFOL;  Surgeon: Manya Silvas, MD;  Location: [facility] ENDOSCOPY;  Service: Endoscopy;  Laterality: N/A;  . DILATION AND CURETTAGE OF UTERUS  1990  . DILATION AND CURETTAGE, DIAGNOSTIC / THERAPEUTIC  1990  . ESOPHAGOGASTRODUODENOSCOPY    . ESOPHAGOGASTRODUODENOSCOPY (EGD) WITH PROPOFOL N/A 10/29/2017   Procedure: ESOPHAGOGASTRODUODENOSCOPY (EGD) WITH PROPOFOL;  Surgeon: Manya Silvas, MD;  Location: [facility] ENDOSCOPY;  Service: Endoscopy;  Laterality: N/A;  . LAPAROSCOPIC PARTIAL COLECTOMY     stage 3-C carcinoma of the cecum, s/p chemotherapy and xrt  . LITHOTRIPSY    . SIGMOIDOSCOPY  08/26/1993  . THYROID LOBECTOMY  2002   s/p XRT  . TOTAL HIP ARTHROPLASTY Right 10/24/2019   Procedure: TOTAL HIP ARTHROPLASTY;  Surgeon: Corky Mull, MD;  Location: [facility] ORS;  Service: Orthopedics;  Laterality: Right;    There were no vitals filed for this visit.   Subjective Assessment - 04/01/20 1350    Subjective Patient reports pain of 3/10 at the left groin and 1/10 at the  right groin. She doesn't remember how she felt after last treatment session. She got some really bad news on Friday, that her best freind has COVID19 (but patient had not been around her because her other freind passed away). She states she had a lot of pain on Saturday, that was worse the longer she was up and about. She cannot remember what she did on Friday. Patient did not get to go to her relative's house because the relative forgot and went to the beach.    Pertinent History Patient is a 69 y.o. female who presents to outpatient physical therapy with a referral for medical diagnosis s/p R THA, R and L trochanteric bursitis. Physician specifically requested IT band stretching. This patient's chief complaints  consist of B hip pain following R THA on 10/24/2019 leading to the following functional deficits: difficulty stand up or sit down, walk up and down steps, squat, bend without pain, waling prolonged distances (over 30 min), cleaning, cooking, dressing, picking things up off the ground, sleeping. Relevant past medical history and comorbidities include hx of pneumonia and acute pericarditis (resolved), GERD, hypothyroidism, peripheral neuropathy in hands from chemo, osteoporosis, hx R breast cancer (2009), thyroid cancer (2002),colon cancer (afer thyroid cancer and before breast cancer), sinus headaches, cholecystectomy, appendectomy, kidney and gallstones.  Patient denies hx stroke, seizures, lung problem, major cardiac events, diabetes, unexplained weight loss, changes in bowel or bladder problems recently, new onset stumbling or dropping things.    Limitations Lifting;Standing;Walking;House hold activities   Functional Limitations: stand up or sit down, walk up and down steps, squat, bend without pain, waling prolonged distances (over 30 min), cleaning, cooking, dressing, picking things up off the ground, sleeping.   How long can you sit comfortably? "longer I sit the more it hurts when I get up"    How long can you stand comfortably? 15 min    How long can you walk comfortably? 30-45 min at the store    Patient Stated Goals "to not have any more pain"    Currently in Pain? Yes    Pain Location Hip    Pain Orientation Left;Right;Anterior           TREATMENT: Denies sensitivity to latex  Therapeutic exercise:to centralize symptoms and improve ROM, strength, muscular endurance, and activity tolerance required for successful completion of functional activities. -NuStep level4using bilateral lower extremities. Seat setting 8. For improved extremity mobility, muscular endurance, and activity tolerance; and to induce the analgesic effect of aerobic exercise, stimulate improved joint nutrition, and  prepare body structures and systems for following interventions. x39mnutes. Average SPM =72 - answered questions within scope of practice about COVID19 vaccinations. Advised patient to direct more specific questions to physician (PCP).   (manual therapy - see below)  - standing hip abduction with one knee on rolling stool with B UE support,3x10 each side. -standing modified cossack squat with buttocks tap on elevated plinth, 2x10 each side.   Manual therapy:to reduce pain and tissue tension, improve range of motion, neuromodulation, in order to promote improved ability to complete functional activities. - hooklying STM to bilateral hip adductors to decrease tightness and tenderness. Noted for quite significant tension and tenderness in L side and spent more time on this side.  - hooklying PROM stretch to hip adductors in tolerated position (initially lateral L hip and possibly knee pain - pt had a hard time relaxing and could not pinpoint pain location), 3x30 seconds each side with PT overpressure to tolerance.  Pt required multimodal  cuing for proper technique and to facilitate improved neuromuscular control, strength, range of motion, and functional ability resulting in improved performance and form.  HOME EXERCISE PROGRAM Access Code: [email or web address] URL: [email or web address] Date: 02/15/2020 Prepared by: Rosita Kea  Exercises ITB Stretch at Wall - 1 x daily - 3 reps - 30 seconds hold ITB Stretch at [place] - 1 x daily - 3 reps - 30 seconds hold Bridge - 1 x daily - 3 sets - 10 reps - 5 seconds hold    PT Education - 04/01/20 1355    Education Details Exercise purpose/form. Self management techniques.    Person(s) Educated Patient    Methods Explanation;Demonstration;Tactile cues;Verbal cues    Comprehension Verbalized understanding;Returned demonstration;Verbal cues required;Tactile cues required;Need further instruction            PT Short Term Goals -  03/19/20 0936      PT SHORT TERM GOAL #1   Title Be independent with initial home exercise program for self-management of symptoms.    Baseline to be intiated at visit 2 as appropriate (02/13/2020);    Time 3    Period Weeks    Status Achieved    Target Date 03/05/20             PT Long Term Goals - 03/19/20 0936      PT LONG TERM GOAL #1   Title Be independent with a long-term home exercise program for self-management of symptoms.    Baseline to be intiated at visit 2 as appropriate (02/13/2020); currently participating when she is not too busy (03/19/2020);    Time 12    Period Weeks    Status Partially Met   TARGET DATE FOR ALL LONG TERM GOALS: 05/07/2020     PT LONG TERM GOAL #2   Title Demonstrate improved FOTO score to equal or greater than 65 by visit 16#  to demonstrate improvement in overall condition and self-reported functional ability.    Baseline 44 (02/13/2020); 60 at visit #10 (03/18/2020);    Time 12    Period Weeks    Status Partially Met      PT LONG TERM GOAL #3   Title Reduce pain with functional activities to equal or less than 1/10 to allow patient to complete usual activities including ADLs, IADLs, and social engagement with less difficulty.    Baseline up to 5/10 (02/13/2020); up to 4/10 (03/18/2020);    Time 12    Period Weeks    Status Partially Met      PT LONG TERM GOAL #4   Title Patient will score equal or greater than 25/30 on functional gait assessment to demonstrate low fall risk.    Baseline 21/30, moderate fall risk (02/13/2020); 26/30, low fall risk (03/18/2020);    Time 12    Period Weeks    Status Achieved      PT LONG TERM GOAL #5   Title Complete community, work and/or recreational activities without limitation due to current condition.    Baseline Functional Limitations: stand up or sit down, walk up and down steps, squat, bend without pain, waling prolonged distances (over 30 min), cleaning, cooking, dressing, picking things up  off the ground, sleeping (02/13/2020); stand up or sit down, squatting, bend without pain, walking prolonged distances (over 30-45 min), cleaning (if she has to bend to the floor, picking things up off the ground (03/18/2020);    Time 12    Period Weeks    Status Partially Met  Plan - 04/01/20 1441    Clinical Impression Statement Patient tolerated treatment well today but continues to complain of bilateral hip pain at the anterior hip.  Not as tender or as tight as previous visits at the left adductor's, but has a lot of guarding while attempting passive range of motion stretching to this region.  Patient had a lot of questions about COVID-19 but vaccine which therapist answered within scope of practice as able.  Patient expressed frustration that she has not been called about setting up her MRI yet and is interested in discharging soon to home exercise program until more information can be obtained from the MRI.  Patient was unable to remember how she felt following last session but appears to have painful symptoms similar to her anterior pain complaint in the recent past.  Patient would benefit from continued management of limiting condition by skilled physical therapist to address remaining impairments and functional limitations to work towards stated goals and return to PLOF or maximal functional independence.    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    Examination-Activity  Limitations Bend;Locomotion Level;Lift;Stairs;Squat;Stand;Sleep;Dressing;Transfers;Carry;Hygiene/Grooming    Examination-Participation Restrictions Cleaning;Yard Work;Community Activity;Meal Prep;Shop;Laundry;Other   Functional Limitations: stand up or sit down, walk up and down steps, squat, bend without pain, waling prolonged distances (over 30 min), cleaning, cooking, dressing, picking things up off the ground, sleeping   Stability/Clinical Decision Making Evolving/Moderate complexity    Rehab Potential Good    PT Frequency 2x / week    PT Duration 12 weeks    PT Treatment/Interventions ADLs/Self Care Home Management;Aquatic Therapy;Cryotherapy;Moist Heat;Gait training;Stair training;DME Instruction;Functional mobility training;Balance training;Neuromuscular re-education;Therapeutic exercise;Therapeutic activities;Cognitive remediation;Patient/family education;Manual techniques;Passive range of motion;Dry needling;Scar mobilization;Joint Manipulations;Spinal Manipulations    PT Next Visit Plan updtate HEP and prepare for d/c    PT Home Exercise Plan Medbridge Access Code: [email or web address]    Consulted and Agree with Plan of Care Patient           Patient will benefit from skilled therapeutic intervention in order to improve the following deficits and impairments:  Abnormal gait, Decreased cognition, Pain, Improper body mechanics, Increased muscle spasms, Decreased mobility, Decreased activity tolerance, Decreased endurance, Decreased range of motion, Decreased strength, Hypomobility, Impaired perceived functional ability, Impaired flexibility, Difficulty walking, Decreased balance  Visit Diagnosis: Pain in right hip  Pain in left hip  Unsteadiness on feet     Problem List Patient Active Problem List   Diagnosis Date Noted  . Anemia 02/11/2020  . 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 Rectal bleed 06/17/2019  . Pleural effusion 12/03/2018  . Right hip pain 12/03/2018  .  Leukopenia 08/21/2018  . Change in vision 06/24/2018  . Hypothyroidism 05/02/2018  . Neck pain 05/02/2018  . Nonintractable headache 04/18/2018  . Thrush 04/18/2018  . Pleural effusion associated with pulmonary infection 04/18/2018  . Other fatigue 04/18/2018  . Abdominal pain 03/06/2016  . Acute diarrhea 01/10/2016  . Neck nodule 10/13/2015  . Vitamin D deficiency 07/27/2015  . Loose stools 04/14/2015  . Mild depression ([place]) 02/02/2015  . Health care maintenance 06/17/2014  . Stress 01/28/2014  . Acute pericarditis 05/31/2013  . Environmental  allergies 02/25/2013  . Left elbow pain 02/25/2013  . Abnormal liver function test 12/07/2012  . Anxiety 05/06/2012  . GERD (gastroesophageal reflux disease) 05/06/2012  . Osteoporosis 05/06/2012  . History of breast cancer 09/18/2008  . History of thyroid cancer 09/18/2008  . Hypercholesterolemia 09/18/2008  . Peripheral neuropathy 09/18/2008  . Palpitations 09/18/2008  . History of malignant neoplasm of large intestine 09/18/2008   Everlean Alstrom. Graylon Good, PT, DPT 04/01/20, 2:42 PM  [facility] [address] [place], [address] Phone: [phone]   Fax:  [phone]  Name: Debra Hale MRN: [id number] Date of Birth: Feb 05, 1951

## 2020-04-02 ENCOUNTER — Other Ambulatory Visit: Payer: Self-pay | Admitting: Internal Medicine

## 2020-04-02 ENCOUNTER — Telehealth: Payer: Self-pay | Admitting: *Deleted

## 2020-04-02 ENCOUNTER — Inpatient Hospital Stay (HOSPITAL_BASED_OUTPATIENT_CLINIC_OR_DEPARTMENT_OTHER): Payer: PPO | Admitting: Oncology

## 2020-04-02 ENCOUNTER — Other Ambulatory Visit (INDEPENDENT_AMBULATORY_CARE_PROVIDER_SITE_OTHER): Payer: PPO

## 2020-04-02 DIAGNOSIS — E876 Hypokalemia: Secondary | ICD-10-CM | POA: Diagnosis not present

## 2020-04-02 DIAGNOSIS — D72819 Decreased white blood cell count, unspecified: Secondary | ICD-10-CM

## 2020-04-02 LAB — POTASSIUM: Potassium: 3.3 mEq/L — ABNORMAL LOW (ref 3.5–5.1)

## 2020-04-02 NOTE — Telephone Encounter (Signed)
Left vm for patient regarding appointment details for upcoming CT guided bone marrow biopsy on 12/6. °

## 2020-04-02 NOTE — Progress Notes (Signed)
Patient denies any concerns today.  

## 2020-04-02 NOTE — Progress Notes (Signed)
Order placed for f/u potassium.  

## 2020-04-03 ENCOUNTER — Encounter: Payer: Self-pay | Admitting: Physical Therapy

## 2020-04-03 ENCOUNTER — Telehealth: Payer: Self-pay

## 2020-04-03 ENCOUNTER — Other Ambulatory Visit: Payer: Self-pay

## 2020-04-03 ENCOUNTER — Ambulatory Visit: Payer: PPO | Attending: Surgery | Admitting: Physical Therapy

## 2020-04-03 DIAGNOSIS — R2681 Unsteadiness on feet: Secondary | ICD-10-CM | POA: Diagnosis not present

## 2020-04-03 DIAGNOSIS — M25551 Pain in right hip: Secondary | ICD-10-CM | POA: Diagnosis not present

## 2020-04-03 DIAGNOSIS — M25552 Pain in left hip: Secondary | ICD-10-CM

## 2020-04-03 NOTE — Therapy (Signed)
[facility] [address], [place], [address] Phone: [phone]   Fax:  [phone]  Physical Therapy Treatment  Patient Details  Name: Debra Hale MRN: [id number] Date of Birth: 09/05/1950 Referring Provider (PT):  Odis Hollingshead, MD   Encounter Date: 04/03/2020   PT End of Session - 04/03/20 1433    Visit Number 15    Number of Visits 24    Date for PT Re-Evaluation 05/07/20    Authorization Type [employer] P[employer] reporting period from 03/21/2020    Progress Note Due on Visit 20    PT Start Time 1357    PT Stop Time 1435    PT Time Calculation (min) 38 min    Equipment Utilized During Treatment Gait belt    Activity Tolerance Patient tolerated treatment well    Behavior During Therapy [facility] for tasks assessed/performed           Past Medical History:  Diagnosis Date  . Anxiety   . Arthritis    Osteoarthritis  . Breast cancer ([place]) 2009   right breast lumpectomy with rad tx  . Colon cancer ([place])    surgery with chemo and rad tx  . Complication of anesthesia   . GERD (gastroesophageal reflux disease)   . Heart palpitations   . History of hiatal hernia   . History of kidney stones   . Hypothyroidism   . Liver disease   . Liver nodule    s/p negative biopsy  . Malignant neoplasm of thyroid gland ([place]) 2002   s/p surgery and XRT  . Osteoporosis   . Other and unspecified hyperlipidemia   . Palpitations   . Personal history of chemotherapy   . Personal history of malignant neoplasm of large intestine    carcinoma - cecum, s/p right laparoscopic colectomy - s/p chemotherapy and XRT  . Personal history of radiation therapy   . Pneumonia 2019  . PONV (postoperative nausea and vomiting)   . Pure hypercholesterolemia   . Unspecified hereditary and idiopathic peripheral neuropathy     Past Surgical History:  Procedure Laterality Date  . APPENDECTOMY  1985  . BREAST BIOPSY Right  2009   positive  . BREAST BIOPSY Right 2009   negative  . BREAST LUMPECTOMY Right 2009   breast cancer  . CHOLECYSTECTOMY  1995  . COLONOSCOPY    . COLONOSCOPY WITH PROPOFOL N/A 10/29/2017   Procedure: COLONOSCOPY WITH PROPOFOL;  Surgeon: Manya Silvas, MD;  Location: [facility] ENDOSCOPY;  Service: Endoscopy;  Laterality: N/A;  . DILATION AND CURETTAGE OF UTERUS  1990  . DILATION AND CURETTAGE, DIAGNOSTIC / THERAPEUTIC  1990  . ESOPHAGOGASTRODUODENOSCOPY    . ESOPHAGOGASTRODUODENOSCOPY (EGD) WITH PROPOFOL N/A 10/29/2017   Procedure: ESOPHAGOGASTRODUODENOSCOPY (EGD) WITH PROPOFOL;  Surgeon: Manya Silvas, MD;  Location: [facility] ENDOSCOPY;  Service: Endoscopy;  Laterality: N/A;  . LAPAROSCOPIC PARTIAL COLECTOMY     stage 3-C carcinoma of the cecum, s/p chemotherapy and xrt  . LITHOTRIPSY    . SIGMOIDOSCOPY  08/26/1993  . THYROID LOBECTOMY  2002   s/p XRT  . TOTAL HIP ARTHROPLASTY Right 10/24/2019   Procedure: TOTAL HIP ARTHROPLASTY;  Surgeon: Corky Mull, MD;  Location: [facility] ORS;  Service: Orthopedics;  Laterality: Right;    There were no vitals filed for this visit.    TREATMENT: Denies sensitivity to latex  Therapeutic exercise:to centralize symptoms and improve ROM, strength, muscular endurance, and activity tolerance required for  successful completion of functional activities. -NuStep level4using bilateral lower extremities. Seat setting 8. For improved extremity mobility, muscular endurance, and activity tolerance; and to induce the analgesic effect of aerobic exercise, stimulate improved joint nutrition, and prepare body structures and systems for following interventions. x51mnutes. Average SPM =72  (manual therapy - see below)  - standing hip abduction with one knee on rolling stool with B UE support,3x10 each side. -standing modified cossack squat with buttocks tap on elevated plinth, 2x10 each side.   Manual therapy:to reduce pain and tissue tension,  improve range of motion, neuromodulation, in order to promote improved ability to complete functional activities. - hooklying STM to bilateral hip adductors with and without "the stick" assist, to decrease tightness and tenderness. Noted for quite significant tension and tenderness in L side and spent more time on this side.  - hooklying PROM stretch to hip adductors in tolerated position (initially lateral L hip and possibly knee pain, 3x30 seconds each side with PT overpressure to tolerance.  Pt required multimodal cuing for proper technique and to facilitate improved neuromuscular control, strength, range of motion, and functional ability resulting in improved performance and form.  HOME EXERCISE PROGRAM Access Code: [email or web address]: [email or web address] Date: 02/15/2020 Prepared by: SRosita Kea Exercises ITB Stretch at Wall - 1 x daily - 3 reps - 30 seconds hold ITB Stretch at [place]- 1 x daily - 3 reps - 30 seconds hold Bridge - 1 x daily - 3 sets - 10 reps - 5 seconds hold    PT Education - 04/03/20 1435    Education Details Exercise purpose/form. Self management techniques.    Person(s) Educated Patient    Methods Explanation;Demonstration;Tactile cues;Verbal cues    Comprehension Verbalized understanding;Returned demonstration;Verbal cues required;Tactile cues required;Need further instruction            PT Short Term Goals - 03/19/20 0936      PT SHORT TERM GOAL #1   Title Be independent with initial home exercise program for self-management of symptoms.    Baseline to be intiated at visit 2 as appropriate (02/13/2020);    Time 3    Period Weeks    Status Achieved    Target Date 03/05/20             PT Long Term Goals - 03/19/20 0936      PT LONG TERM GOAL #1   Title Be independent with a long-term home exercise program for self-management of symptoms.    Baseline to be intiated at visit 2 as appropriate (02/13/2020); currently participating  when she is not too busy (03/19/2020);    Time 12    Period Weeks    Status Partially Met   TARGET DATE FOR ALL LONG TERM GOALS: 05/07/2020     PT LONG TERM GOAL #2   Title Demonstrate improved FOTO score to equal or greater than 65 by visit 16#  to demonstrate improvement in overall condition and self-reported functional ability.    Baseline 44 (02/13/2020); 60 at visit #10 (03/18/2020);    Time 12    Period Weeks    Status Partially Met      PT LONG TERM GOAL #3   Title Reduce pain with functional activities to equal or less than 1/10 to allow patient to complete usual activities including ADLs, IADLs, and social engagement with less difficulty.    Baseline up to 5/10 (02/13/2020); up to 4/10 (03/18/2020);    Time 12    Period Weeks  Status Partially Met      PT LONG TERM GOAL #4   Title Patient will score equal or greater than 25/30 on functional gait assessment to demonstrate low fall risk.    Baseline 21/30, moderate fall risk (02/13/2020); 26/30, low fall risk (03/18/2020);    Time 12    Period Weeks    Status Achieved      PT LONG TERM GOAL #5   Title Complete community, work and/or recreational activities without limitation due to current condition.    Baseline Functional Limitations: stand up or sit down, walk up and down steps, squat, bend without pain, waling prolonged distances (over 30 min), cleaning, cooking, dressing, picking things up off the ground, sleeping (02/13/2020); stand up or sit down, squatting, bend without pain, walking prolonged distances (over 30-45 min), cleaning (if she has to bend to the floor, picking things up off the ground (03/18/2020);    Time 12    Period Weeks    Status Partially Met                 Plan - 04/03/20 1429    Clinical Impression Statement Patient tolerated treatment with more pain than usual at the left groin. Stretching was performed with more intense position of knee more extended than usual due to patient being more  relaxed. Patient quite anxious about upcoming bone marrow biopsy and some other things she saw scheduled when our clinic printed out her future PT visits and it showed other appointments in the [facility] system on her schedule including a "Tumor Conference." Advised patient to call her oncologist to ask what the other things on the schedule were. She is worried her doctors think she has a tumor. Patient would benefit from continued management of limiting condition by skilled physical therapist to address remaining impairments and functional limitations to work towards stated goals and return to PLOF or maximal functional independence.    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    Examination-Activity Limitations Bend;Locomotion Level;Lift;Stairs;Squat;Stand;Sleep;Dressing;Transfers;Carry;Hygiene/Grooming    Examination-Participation Restrictions Cleaning;Yard Work;Community Activity;Meal Prep;Shop;Laundry;Other   Functional Limitations: stand up or sit down, walk up and down steps, squat, bend without pain, waling prolonged distances (over 30 min), cleaning, cooking, dressing, picking things up off the ground, sleeping   Stability/Clinical Decision Making Evolving/Moderate complexity    Rehab Potential Good    PT Frequency 2x / week    PT Duration 12 weeks    PT Treatment/Interventions ADLs/Self Care Home Management;Aquatic Therapy;Cryotherapy;Moist Heat;Gait training;Stair training;DME Instruction;Functional  mobility training;Balance training;Neuromuscular re-education;Therapeutic exercise;Therapeutic activities;Cognitive remediation;Patient/family education;Manual techniques;Passive range of motion;Dry needling;Scar mobilization;Joint Manipulations;Spinal Manipulations    PT Next Visit Plan continue working on reducing pain at the groin    PT [place] Access Code: [email or web address]    Consulted and Agree with Plan of Care Patient           Patient will benefit from skilled therapeutic intervention in order to improve the following deficits and impairments:  Abnormal gait, Decreased cognition, Pain, Improper body mechanics, Increased muscle spasms, Decreased mobility, Decreased activity tolerance, Decreased endurance, Decreased range of motion, Decreased strength, Hypomobility, Impaired perceived functional ability, Impaired flexibility, Difficulty  walking, Decreased balance  Visit Diagnosis: Pain in right hip  Pain in left hip  Unsteadiness on feet     Problem List Patient Active Problem List   Diagnosis Date Noted  . Anemia 02/11/2020  . 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 Rectal bleed 06/17/2019  . Pleural effusion 12/03/2018  . Right hip pain 12/03/2018  . Leukopenia 08/21/2018  . Change in vision 06/24/2018  . Hypothyroidism 05/02/2018  . Neck pain 05/02/2018  . Nonintractable headache 04/18/2018  . Thrush 04/18/2018  . Pleural effusion associated with pulmonary infection 04/18/2018  . Other fatigue 04/18/2018  . Abdominal pain 03/06/2016  . Acute diarrhea 01/10/2016  . Neck nodule 10/13/2015  . Vitamin D deficiency 07/27/2015  . Loose stools 04/14/2015  . Mild depression ([place]) 02/02/2015  . Health care maintenance 06/17/2014  . Stress 01/28/2014  . Acute pericarditis 05/31/2013  . Environmental allergies 02/25/2013  . Left elbow pain 02/25/2013  . Abnormal liver function test 12/07/2012  . Anxiety 05/06/2012  . GERD (gastroesophageal  reflux disease) 05/06/2012  . Osteoporosis 05/06/2012  . History of breast cancer 09/18/2008  . History of thyroid cancer 09/18/2008  . Hypercholesterolemia 09/18/2008  . Peripheral neuropathy 09/18/2008  . Palpitations 09/18/2008  . History of malignant neoplasm of large intestine 09/18/2008   Everlean Alstrom. Graylon Good, PT, DPT 04/03/20, 2:35 PM  [facility] [address], [place], [address] Phone: [phone]   Fax:  [phone]  Name: GWENDOLYNE WELFORD MRN: [id number] Date of Birth: 02-Sep-1950

## 2020-04-03 NOTE — Telephone Encounter (Signed)
LMTCB for potassium dose changes & make sure pt is not having diarrhea.

## 2020-04-05 ENCOUNTER — Other Ambulatory Visit: Payer: Self-pay | Admitting: Radiology

## 2020-04-05 NOTE — Progress Notes (Signed)
Patient on schedule for BMB 04/08/2020, called and spoke with patient on phone with pre procedure instructions given. Made aware to be here @ 0730, NPO after MN, and driver for discharge post procedure/instructions given. Stated understanding.

## 2020-04-08 ENCOUNTER — Encounter: Payer: PPO | Admitting: Physical Therapy

## 2020-04-08 ENCOUNTER — Ambulatory Visit
Admission: RE | Admit: 2020-04-08 | Discharge: 2020-04-08 | Disposition: A | Discharge status: Home / Self Care | Payer: PPO | Source: Ambulatory Visit | Attending: Oncology | Admitting: Oncology

## 2020-04-08 ENCOUNTER — Other Ambulatory Visit: Payer: Self-pay

## 2020-04-08 ENCOUNTER — Encounter
Admit: 2020-04-08 | Discharge: 2020-04-08 | Payer: PRIVATE HEALTH INSURANCE | Attending: Hematology | Primary: Hematology

## 2020-04-08 DIAGNOSIS — D479 Neoplasm of uncertain behavior of lymphoid, hematopoietic and related tissue, unspecified: Secondary | ICD-10-CM | POA: Diagnosis not present

## 2020-04-08 DIAGNOSIS — K219 Gastro-esophageal reflux disease without esophagitis: Secondary | ICD-10-CM | POA: Insufficient documentation

## 2020-04-08 DIAGNOSIS — Z79899 Other long term (current) drug therapy: Secondary | ICD-10-CM | POA: Insufficient documentation

## 2020-04-08 DIAGNOSIS — D72829 Elevated white blood cell count, unspecified: Secondary | ICD-10-CM | POA: Diagnosis not present

## 2020-04-08 DIAGNOSIS — C9200 Acute myeloblastic leukemia, not having achieved remission: Secondary | ICD-10-CM | POA: Insufficient documentation

## 2020-04-08 DIAGNOSIS — D72819 Decreased white blood cell count, unspecified: Secondary | ICD-10-CM | POA: Diagnosis not present

## 2020-04-08 DIAGNOSIS — Z853 Personal history of malignant neoplasm of breast: Secondary | ICD-10-CM | POA: Diagnosis not present

## 2020-04-08 DIAGNOSIS — D696 Thrombocytopenia, unspecified: Secondary | ICD-10-CM | POA: Diagnosis not present

## 2020-04-08 LAB — CBC WITH DIFFERENTIAL/PLATELET
Abs Immature Granulocytes: 0 10*3/uL (ref 0.00–0.07)
Basophils Absolute: 0 10*3/uL (ref 0.0–0.1)
Basophils Relative: 0 %
Eosinophils Absolute: 0 10*3/uL (ref 0.0–0.5)
Eosinophils Relative: 1 %
HCT: 39.2 % (ref 36.0–46.0)
Hemoglobin: 13 g/dL (ref 12.0–15.0)
Immature Granulocytes: 0 %
Lymphocytes Relative: 44 %
Lymphs Abs: 0.7 10*3/uL (ref 0.7–4.0)
MCH: 27.5 pg (ref 26.0–34.0)
MCHC: 33.2 g/dL (ref 30.0–36.0)
MCV: 82.9 fL (ref 80.0–100.0)
Monocytes Absolute: 0.2 10*3/uL (ref 0.1–1.0)
Monocytes Relative: 13 %
Neutro Abs: 0.6 10*3/uL — ABNORMAL LOW (ref 1.7–7.7)
Neutrophils Relative %: 42 %
Platelets: 138 10*3/uL — ABNORMAL LOW (ref 150–400)
RBC: 4.73 MIL/uL (ref 3.87–5.11)
RDW: 13.7 % (ref 11.5–15.5)
Smear Review: NORMAL
WBC: 1.6 10*3/uL — ABNORMAL LOW (ref 4.0–10.5)
nRBC: 0 % (ref 0.0–0.2)

## 2020-04-08 LAB — PATHOLOGIST SMEAR REVIEW

## 2020-04-08 MED ORDER — HEPARIN SOD (PORK) LOCK FLUSH 100 UNIT/ML IV SOLN
INTRAVENOUS | Status: AC
  Filled 2020-04-08: qty 5

## 2020-04-08 MED ORDER — FENTANYL CITRATE (PF) 100 MCG/2ML IJ SOLN
INTRAMUSCULAR | Status: AC | PRN
Start: 2020-04-08 — End: 2020-04-08
  Administered 2020-04-08 (×2): 50 ug via INTRAVENOUS

## 2020-04-08 MED ORDER — MIDAZOLAM HCL 2 MG/2ML IJ SOLN
INTRAMUSCULAR | Status: AC | PRN
  Administered 2020-04-08 (×2): 1 mg via INTRAVENOUS

## 2020-04-08 MED ORDER — FENTANYL CITRATE (PF) 100 MCG/2ML IJ SOLN
INTRAMUSCULAR | Status: AC
  Filled 2020-04-08: qty 2

## 2020-04-08 MED ORDER — SODIUM CHLORIDE 0.9 % IV SOLN: INTRAVENOUS | Status: DC

## 2020-04-08 MED ORDER — MIDAZOLAM HCL 2 MG/2ML IJ SOLN
INTRAMUSCULAR | Status: AC
  Filled 2020-04-08: qty 2

## 2020-04-08 NOTE — Procedures (Signed)
Interventional Radiology Procedure Note  Procedure: CT guided aspirate and core biopsy of right iliac bone Complications: None Recommendations: - Bedrest supine x 1 hrs - Hydrocodone PRN  Pain - Follow biopsy results  Signed,   K. , MD   

## 2020-04-08 NOTE — H&P (Signed)
Chief Complaint: ;leukopenia. Request is for bone marrow biopsy  Referring Physician(s): Finnegan,Timothy J  Supervising Physician: Jacqulynn Cadet  Patient Status: Debra Hale - Out-pt  History of Present Illness: Debra Hale is a 69 y.o. female . History of breast cancer (right),s/p lumpectomy and hormonal treatment X 5 years Thyroid cancer, GERD found to have leukopenia. Team is requesting a bone marrow biopsy for further evaluation of MDS or acute leukemia.   Currently without any significant complaints. Patient alert and laying in bed, calm and comfortable. Denies any fevers, headache, chest pain, SOB, cough, abdominal pain, nausea, vomiting or bleeding. Return precautions and treatment recommendations and follow-up discussed with the patient who is agreeable with the plan.    Past Medical History:  Diagnosis Date  . Anxiety   . Arthritis    Osteoarthritis  . Breast cancer ([place]) 2009   right breast lumpectomy with rad tx  . Colon cancer ([place])    surgery with chemo and rad tx  . Complication of anesthesia   . GERD (gastroesophageal reflux disease)   . Heart palpitations   . History of hiatal hernia   . History of kidney stones   . Hypothyroidism   . Liver disease   . Liver nodule    s/p negative biopsy  . Malignant neoplasm of thyroid gland ([place]) 2002   s/p surgery and XRT  . Osteoporosis   . Other and unspecified hyperlipidemia   . Palpitations   . Personal history of chemotherapy   . Personal history of malignant neoplasm of large intestine    carcinoma - cecum, s/p right laparoscopic colectomy - s/p chemotherapy and XRT  . Personal history of radiation therapy   . Pneumonia 2019  . PONV (postoperative nausea and vomiting)   . Pure hypercholesterolemia   . Unspecified hereditary and idiopathic peripheral neuropathy     Past Surgical History:  Procedure Laterality Date  . APPENDECTOMY  1985  . BREAST BIOPSY Right 2009   positive  . BREAST BIOPSY Right  2009   negative  . BREAST LUMPECTOMY Right 2009   breast cancer  . CHOLECYSTECTOMY  1995  . COLONOSCOPY    . COLONOSCOPY WITH PROPOFOL N/A 10/29/2017   Procedure: COLONOSCOPY WITH PROPOFOL;  Surgeon: Manya Silvas, MD;  Location: [facility] ENDOSCOPY;  Service: Endoscopy;  Laterality: N/A;  . DILATION AND CURETTAGE OF UTERUS  1990  . DILATION AND CURETTAGE, DIAGNOSTIC / THERAPEUTIC  1990  . ESOPHAGOGASTRODUODENOSCOPY    . ESOPHAGOGASTRODUODENOSCOPY (EGD) WITH PROPOFOL N/A 10/29/2017   Procedure: ESOPHAGOGASTRODUODENOSCOPY (EGD) WITH PROPOFOL;  Surgeon: Manya Silvas, MD;  Location: [facility] ENDOSCOPY;  Service: Endoscopy;  Laterality: N/A;  . LAPAROSCOPIC PARTIAL COLECTOMY     stage 3-C carcinoma of the cecum, s/p chemotherapy and xrt  . LITHOTRIPSY    . SIGMOIDOSCOPY  08/26/1993  . THYROID LOBECTOMY  2002   s/p XRT  . TOTAL HIP ARTHROPLASTY Right 10/24/2019   Procedure: TOTAL HIP ARTHROPLASTY;  Surgeon: Corky Mull, MD;  Location: [facility] ORS;  Service: Orthopedics;  Laterality: Right;    Allergies: Demeclocycline, Tetracyclines & related, Augmentin [amoxicillin-pot clavulanate], Bentyl [dicyclomine hcl], Ciprofloxacin, Codeine, Epinephrine, Flagyl [metronidazole], Librax [chlordiazepoxide-clidinium], Novocain [procaine], Phenobarbital, Prednisone, and Ultram [tramadol]  Medications: Prior to Admission medications   Medication Sig Start Date End Date Taking? Authorizing Provider  Azelastine-Fluticasone [facility]) 137-50 MCG/ACT SUSP 2 sprays each nostril q day 02/01/20  Yes [name], MD  clonazePAM (KLONOPIN) 0.5 MG tablet Take 1.5 mg by mouth 2 (two) times daily.  09/06/17  Yes [provider]  colestipol (COLESTID) 1 g tablet Take 1 g by mouth every morning. TAKES FOR DIARRHEA 10/24/18  Yes [provider]  escitalopram (LEXAPRO) 20 MG tablet Take 20 mg by mouth every morning.    Yes [provider]  ibuprofen (ADVIL) 200 MG tablet Take 200 mg by mouth every  8 (eight) hours as needed for moderate pain.   Yes [provider]  levothyroxine (SYNTHROID) 125 MCG tablet Take 1 tablet (125 mcg total) by mouth daily. 02/23/20  Yes [name], MD  magnesium oxide (MAG-OX) 400 MG tablet TAKE 1 TABLET BY MOUTH EVERY DAY 12/05/19  Yes [name], MD  pantoprazole (PROTONIX) 40 MG tablet TAKE 1 TABLET BY MOUTH EVERY DAY Patient taking differently: Take 40 mg by mouth every morning.  07/21/19  Yes [name], MD  potassium chloride (KLOR-CON) 10 MEQ tablet TAKE 1 TABLET BY MOUTH EVERY DAY 12/05/19  Yes [name], MD  ibuprofen (ADVIL,MOTRIN) 600 MG tablet Take 1 tablet (600 mg total) by mouth every 6 (six) hours as needed. 04/21/18   Arta Silence, MD     Family History  Problem Relation Age of Onset  . Stroke Mother        11s  . Alzheimer's disease Mother   . Cancer Mother   . Lung cancer Father   . Prostate cancer Father   . Cancer Father        Colon  . Colon cancer Father   . Breast cancer Sister        22's  . Lung cancer Sister   . Breast cancer Maternal Aunt     Social History   Socioeconomic History  . Marital status: Single    Spouse name: Not on file  . Number of children: 0  . Years of education: Not on file  . Highest education level: Not on file  Occupational History  . Not on file  Tobacco Use  . Smoking status: Never Smoker  . Smokeless tobacco: Never Used  Vaping Use  . Vaping Use: Never used  Substance and Sexual Activity  . Alcohol use: No    Alcohol/week: 0.0 standard drinks  . Drug use: No  . Sexual activity: Not Currently  Other Topics Concern  . Not on file  Social History Narrative  . Not on file   Social Determinants of Health   Financial Resource Strain: Low Risk   . Difficulty of Paying Living Expenses: Not hard at all  Food Insecurity: No Food Insecurity  . Worried About Charity fundraiser in the Last Year: Never true  . Ran Out of Food in the Last Year: Never true   Transportation Needs: No Transportation Needs  . Lack of Transportation (Medical): No  . Lack of Transportation (Non-Medical): No  Physical Activity:   . Days of Exercise per Week: Not on file  . Minutes of Exercise per Session: Not on file  Stress: No Stress Concern Present  . Feeling of Stress : Not at all  Social Connections: Unknown  . Frequency of Communication with Friends and Family: More than three times a week  . Frequency of Social Gatherings with Friends and Family: Not on file  . Attends Religious Services: Not on file  . Active Member of Clubs or Organizations: Not on file  . Attends Archivist Meetings: Not on file  . Marital Status: Not on file    Review of Systems: A 12 point ROS  discussed and pertinent positives are indicated in the HPI above.  All other systems are negative.  Review of Systems  Constitutional: Negative for fatigue and fever.  HENT: Negative for congestion.   Respiratory: Negative for cough and shortness of breath.   Gastrointestinal: Negative for abdominal pain, diarrhea, nausea and vomiting.    Vital Signs: BP 134/69   Pulse 79   Temp 97.8 F (36.6 C) (Oral)   Resp 17   Ht 5' 4"  (1.626 m)   Wt 140 lb (63.5 kg)   SpO2 97%   BMI 24.03 kg/m   Physical Exam Vitals and nursing note reviewed.  Constitutional:      Appearance: She is well-developed.  HENT:     Head: Normocephalic and atraumatic.  Eyes:     Conjunctiva/sclera: Conjunctivae normal.  Cardiovascular:     Rate and Rhythm: Normal rate and regular rhythm.     Heart sounds: Normal heart sounds.  Pulmonary:     Effort: Pulmonary effort is normal.     Breath sounds: Normal breath sounds.  Musculoskeletal:        General: Normal range of motion.     Cervical back: Normal range of motion.  Skin:    General: Skin is warm.  Neurological:     Mental Status: She is alert and oriented to person, place, and time.     Imaging: No results  found.  Labs:  CBC: Recent Labs    10/18/19 1141 10/26/19 0529 02/20/20 1045 03/07/20 1417  WBC 2.6* 3.1* 2.2 Repeated and verified X2.* 1.7*  HGB 14.2 10.0* 13.8 12.9  HCT 42.1 29.4* 40.2 38.6  PLT 164 114* 217.0 186    COAGS: No results for input(s): INR, APTT in the last 8760 hours.  BMP: Recent Labs    10/18/19 1141 10/18/19 1141 10/25/19 0526 10/25/19 0526 10/26/19 0529 02/20/20 1045 03/05/20 1054 04/02/20 1050  NA 140  --  139  --  137 143  --   --   K 3.9   < > 4.0   < > 4.2 3.1* 3.4* 3.3*  CL 104  --  105  --  102 105  --   --   CO2 26  --  28  --  29 29  --   --   GLUCOSE 101*  --  135*  --  147* 98  --   --   BUN 19  --  21  --  17 13  --   --   CALCIUM 8.7*  --  7.4*  --  7.7* 8.9  --   --   CREATININE 1.05*  --  1.21*  --  1.18* 0.98  --   --   GFRNONAA 54*  --  46*  --  47*  --   --   --   GFRAA >60  --  53*  --  54*  --   --   --    < > = values in this interval not displayed.    LIVER FUNCTION TESTS: Recent Labs    09/26/19 1002 10/18/19 1141 02/20/20 1045  BILITOT 0.6 0.8 0.7  AST 14 19 12   ALT 34 39 20  ALKPHOS 134* 120 111  PROT 6.5 6.6 7.1  ALBUMIN 4.1 3.7 3.9    Assessment and Plan:  70 y.o. female outpatient. History of breast cancer (right,s/p lumpectomy and hormonal treatment X 5 years Thyroid cancer, GERD found to have leukopenia. Team is requesting a bone marrow  biopsy for further evaluation of MDS or acute leukemia.   WBC is all other labs and medications are within acceptable parameters. Allergies include tetracycline, ciprofloxacin, Augmentin, Codeine, Flagyl, Tramadol and Novocain. Confirmed with pharmacy lidocaine is a different class than novocain and can be given for procedure. NPO since midnight.   Risks and benefits of bone marrow was discussed with the patient and/or patient's family including, but not limited to bleeding, infection, damage to adjacent structures or low yield requiring additional tests.  All of the  questions were answered and there is agreement to proceed.  Consent signed and in chart.  Thank you for this interesting consult.  I greatly enjoyed meeting SHAINDY READER and look forward to participating in their care.  A copy of this report was sent to the requesting provider on this date.  Electronically Signed: Jacqualine Mau, NP 04/08/2020, 8:22 AM   I spent a total of  30 Minutes   in face to face in clinical consultation, greater than 50% of which was counseling/coordinating care for bone marrow biopsy

## 2020-04-08 NOTE — Progress Notes (Signed)
Patient clinically stable post BMB per Dr Laurence Ferrari, tolerated well. Denies complaints at this time. bandade d/i to posterior mid sacral area. Awake/alert and oriented post procedure. Received versed 2 mg along with Fentanyl 100 mcg IV for procedure. Report given to [facility] Rn with questions answered post procedure in specials.

## 2020-04-10 ENCOUNTER — Ambulatory Visit: Payer: PPO | Admitting: Physical Therapy

## 2020-04-10 LAB — SURGICAL PATHOLOGY

## 2020-04-11 ENCOUNTER — Encounter: Payer: Self-pay | Admitting: Oncology

## 2020-04-11 ENCOUNTER — Inpatient Hospital Stay: Payer: PPO | Attending: Oncology | Admitting: Oncology

## 2020-04-11 ENCOUNTER — Other Ambulatory Visit: Payer: Self-pay

## 2020-04-11 DIAGNOSIS — Z9221 Personal history of antineoplastic chemotherapy: Secondary | ICD-10-CM | POA: Insufficient documentation

## 2020-04-11 DIAGNOSIS — Z923 Personal history of irradiation: Secondary | ICD-10-CM | POA: Insufficient documentation

## 2020-04-11 DIAGNOSIS — Z853 Personal history of malignant neoplasm of breast: Secondary | ICD-10-CM | POA: Diagnosis not present

## 2020-04-11 DIAGNOSIS — C9200 Acute myeloblastic leukemia, not having achieved remission: Secondary | ICD-10-CM | POA: Diagnosis not present

## 2020-04-11 DIAGNOSIS — Z85038 Personal history of other malignant neoplasm of large intestine: Secondary | ICD-10-CM | POA: Diagnosis not present

## 2020-04-11 DIAGNOSIS — R5383 Other fatigue: Secondary | ICD-10-CM | POA: Insufficient documentation

## 2020-04-11 DIAGNOSIS — R531 Weakness: Secondary | ICD-10-CM | POA: Insufficient documentation

## 2020-04-11 DIAGNOSIS — Z85850 Personal history of malignant neoplasm of thyroid: Secondary | ICD-10-CM | POA: Insufficient documentation

## 2020-04-12 DIAGNOSIS — C9200 Acute myeloblastic leukemia, not having achieved remission: Secondary | ICD-10-CM | POA: Insufficient documentation

## 2020-04-12 NOTE — Progress Notes (Signed)
[place]  [phone]) [phone] Fax:(336) [phone]  ID: Debra Hale OB: 09-May-1950  MR#: [id number]  JFH#:[id number]  Patient Care Team: [name], MD as PCP - General (Internal Medicine) Wellington Hampshire, MD as PCP - Cardiology (Cardiology)  CHIEF COMPLAINT: AML.  INTERVAL HISTORY: Patient returns to clinic today as an add-on for further evaluation and discussion of her bone marrow biopsy results.  She admits to increased weakness and fatigue today.  She continues to be anxious.  She has no neurologic complaints.  She denies any recent fevers or illnesses.  She has a good appetite and denies weight loss.  She has no chest pain, shortness of breath, cough, or hemoptysis.  She denies any nausea, vomiting, constipation, or diarrhea.  She has no urinary complaints.  Patient offers no further specific complaints today.  REVIEW OF SYSTEMS:   Review of Systems  Constitutional: Positive for malaise/fatigue. Negative for fever and weight loss.  Respiratory: Negative.  Negative for cough, hemoptysis and shortness of breath.   Cardiovascular: Negative.  Negative for chest pain and leg swelling.  Gastrointestinal: Negative.  Negative for abdominal pain.  Genitourinary: Negative.  Negative for dysuria.  Musculoskeletal: Negative.  Negative for back pain.  Skin: Negative.  Negative for rash.  Neurological: Positive for weakness. Negative for dizziness, focal weakness and headaches.  Psychiatric/Behavioral: The patient is nervous/anxious.     As per HPI. Otherwise, a complete review of systems is negative.  PAST MEDICAL HISTORY: Past Medical History:  Diagnosis Date  . Anxiety   . Arthritis    Osteoarthritis  . Breast cancer ([place]) 2009   right breast lumpectomy with rad tx  . Colon cancer ([place])    surgery with chemo and rad tx  . Complication of anesthesia   . GERD (gastroesophageal reflux disease)   . Heart palpitations   . History of hiatal hernia   .  History of kidney stones   . Hypothyroidism   . Liver disease   . Liver nodule    s/p negative biopsy  . Malignant neoplasm of thyroid gland ([place]) 2002   s/p surgery and XRT  . Osteoporosis   . Other and unspecified hyperlipidemia   . Palpitations   . Personal history of chemotherapy   . Personal history of malignant neoplasm of large intestine    carcinoma - cecum, s/p right laparoscopic colectomy - s/p chemotherapy and XRT  . Personal history of radiation therapy   . Pneumonia 2019  . PONV (postoperative nausea and vomiting)   . Pure hypercholesterolemia   . Unspecified hereditary and idiopathic peripheral neuropathy     PAST SURGICAL HISTORY: Past Surgical History:  Procedure Laterality Date  . APPENDECTOMY  1985  . BREAST BIOPSY Right 2009   positive  . BREAST BIOPSY Right 2009   negative  . BREAST LUMPECTOMY Right 2009   breast cancer  . CHOLECYSTECTOMY  1995  . COLONOSCOPY    . COLONOSCOPY WITH PROPOFOL N/A 10/29/2017   Procedure: COLONOSCOPY WITH PROPOFOL;  Surgeon: Manya Silvas, MD;  [facility] END[facility]COPY;  Service: Endoscopy;  Laterality: N/A;  . DILATION AND CURETTAGE OF UTERUS  1990  . DILATION AND CURETTAGE, DIAGNOSTIC / THERAPEUTIC  1990  . ESOPHAGOGASTRODUODENOSCOPY    . ESOPHAGOGASTRODUODENOSCOPY (EGD) WITH PROPOFOL N/A 10/29/2017   Procedure: ESOPHAGOGASTRODUODENOSCOPY (EGD) WITH PROPOFOL;  Surgeon: Manya Silvas, MD;  Location: [facility] ENDOSCOPY;  Service: Endoscopy;  Laterality: N/A;  . LAPAROSCOPIC PARTIAL COLECTOMY     stage 3-C carcinoma of  the cecum, s/p chemotherapy and xrt  . LITHOTRIPSY    . SIGMOIDOSCOPY  08/26/1993  . THYROID LOBECTOMY  2002   s/p XRT  . TOTAL HIP ARTHROPLASTY Right 10/24/2019   Procedure: TOTAL HIP ARTHROPLASTY;  Surgeon: Corky Mull, MD;  Location: [facility] ORS;  Service: Orthopedics;  Laterality: Right;    FAMILY HISTORY: Family History  Problem Relation Age of Onset  . Stroke Mother        22s  . Alzheimer's  disease Mother   . Cancer Mother   . Lung cancer Father   . Prostate cancer Father   . Cancer Father        Colon  . Colon cancer Father   . Breast cancer Sister        32's  . Lung cancer Sister   . Breast cancer Maternal Aunt     ADVANCED DIRECTIVES (Y/N):  N  HEALTH MAINTENANCE: Social History   Tobacco Use  . Smoking status: Never Smoker  . Smokeless tobacco: Never Used  Vaping Use  . Vaping Use: Never used  Substance Use Topics  . Alcohol use: No    Alcohol/week: 0.0 standard drinks  . Drug use: No     Colonoscopy:  PAP:  Bone density:  Lipid panel:  Allergies  Allergen Reactions  . Demeclocycline Other (See Comments)    Throat swells  . Tetracyclines & Related Other (See Comments)    Throat swells   . Augmentin [Amoxicillin-Pot Clavulanate] Diarrhea  . Bentyl [Dicyclomine Hcl]     unkn  . Ciprofloxacin Diarrhea  . Codeine Other (See Comments)    dizziness    . Epinephrine     Speeds heart up - panic   . Flagyl [Metronidazole] Nausea And Vomiting  . Librax [Chlordiazepoxide-Clidinium]     unkn  . Novocain [Procaine] Other (See Comments)    "Shaky"  . Phenobarbital     unkn  . Prednisone     Nervous    . Ultram [Tramadol] Other (See Comments)    Sick feeling    Current Outpatient Medications  Medication Sig Dispense Refill  . Azelastine-Fluticasone (DYMISTA) 137-50 MCG/ACT SUSP 2 sprays each nostril q day 23 g 2  . clonazePAM (KLONOPIN) 0.5 MG tablet Take 1.5 mg by mouth 2 (two) times daily.   0  . colestipol (COLESTID) 1 g tablet Take 1 g by mouth every morning. TAKES FOR DIARRHEA    . escitalopram (LEXAPRO) 20 MG tablet Take 20 mg by mouth every morning.     Marland Kitchen ibuprofen (ADVIL) 200 MG tablet Take 200 mg by mouth every 8 (eight) hours as needed for moderate pain.    Marland Kitchen levothyroxine (SYNTHROID) 125 MCG tablet Take 1 tablet (125 mcg total) by mouth daily. 30 tablet 2  . magnesium oxide (MAG-OX) 400 MG tablet TAKE 1 TABLET BY MOUTH EVERY DAY  90 tablet 1  . pantoprazole (PROTONIX) 40 MG tablet TAKE 1 TABLET BY MOUTH EVERY DAY (Patient taking differently: Take 40 mg by mouth every morning.) 90 tablet 2  . potassium chloride (KLOR-CON) 10 MEQ tablet TAKE 1 TABLET BY MOUTH EVERY DAY 90 tablet 1  . ibuprofen (ADVIL,MOTRIN) 600 MG tablet Take 1 tablet (600 mg total) by mouth every 6 (six) hours as needed. (Patient not taking: Reported on 04/11/2020) 30 tablet 0   Current Facility-Administered Medications  Medication Dose Route Frequency Provider Last Rate Last Admin  . ondansetron (ZOFRAN) tablet 8 mg  8 mg Oral Once Jodelle Green,  FNP        OBJECTIVE: Vitals:   04/11/20 1026  BP: 129/79  Pulse: 99  Resp: 16  Temp: (!) 96.9 F (36.1 C)  SpO2: 100%     Body mass index is 24.58 kg/m.    ECOG FS:0 - Asymptomatic  General: Well-developed, well-nourished, no acute distress. Eyes: Pink conjunctiva, anicteric sclera. HEENT: Normocephalic, moist mucous membranes. Lungs: No audible wheezing or coughing. Heart: Regular rate and rhythm. Abdomen: Soft, nontender, no obvious distention. Musculoskeletal: No edema, cyanosis, or clubbing. Neuro: Alert, answering all questions appropriately. Cranial nerves grossly intact. Skin: No rashes or petechiae noted. Psych: Normal affect.   LAB RESULTS:  Lab Results  Component Value Date   NA 143 02/20/2020   K 3.3 (L) 04/02/2020   CL 105 02/20/2020   CO2 29 02/20/2020   GLUCOSE 98 02/20/2020   BUN 13 02/20/2020   CREATININE 0.98 02/20/2020   CALCIUM 8.9 02/20/2020   PROT 7.1 02/20/2020   ALBUMIN 3.9 02/20/2020   AST 12 02/20/2020   ALT 20 02/20/2020   ALKPHOS 111 02/20/2020   BILITOT 0.7 02/20/2020   GFRNONAA 47 (L) 10/26/2019   GFRAA 54 (L) 10/26/2019    Lab Results  Component Value Date   WBC 1.6 (L) 04/08/2020   NEUTROABS 0.6 (L) 04/08/2020   HGB 13.0 04/08/2020   HCT 39.2 04/08/2020   MCV 82.9 04/08/2020   PLT 138 (L) 04/08/2020     STUDIES: CT BONE MARROW BIOPSY  & ASPIRATION  Result Date: 04/08/2020 INDICATION: 69 year old female with a history of breast cancer status post treatment and new onset leukopenia. She presents for bone marrow biopsy. EXAM: CT GUIDED BONE MARROW ASPIRATION AND CORE BIOPSY Interventional Radiologist:  Criselda Peaches, MD MEDICATIONS: None. ANESTHESIA/SEDATION: Moderate (conscious) sedation was employed during this procedure. A total of 2 milligrams versed and 100 micrograms fentanyl were administered intravenously. The patient's level of consciousness and vital signs were monitored continuously by radiology nursing throughout the procedure under my direct supervision. Total monitored sedation time: 9 minutes FLUOROSCOPY TIME:  None. COMPLICATIONS: None immediate. Estimated blood loss: <25 mL PROCEDURE: Informed written consent was obtained from the patient after a thorough discussion of the procedural risks, benefits and alternatives. All questions were addressed. Maximal Sterile Barrier Technique was utilized including caps, mask, sterile gowns, sterile gloves, sterile drape, hand hygiene and skin antiseptic. A timeout was performed prior to the initiation of the procedure. The patient was positioned prone and non-contrast localization CT was performed of the pelvis to demonstrate the iliac marrow spaces. Maximal barrier sterile technique utilized including caps, mask, sterile gowns, sterile gloves, large sterile drape, hand hygiene, and betadine prep. Under sterile conditions and local anesthesia, an 11 gauge coaxial bone biopsy needle was advanced into the right iliac marrow space. Needle position was confirmed with CT imaging. Initially, bone marrow aspiration was performed. Next, the 11 gauge outer cannula was utilized to obtain a right iliac bone marrow core biopsy. Needle was removed. Hemostasis was obtained with compression. The patient tolerated the procedure well. Samples were prepared with the cytotechnologist. IMPRESSION:  Technically successful CT-guided bone marrow aspiration and core biopsy. Electronically Signed   By: Jacqulynn Cadet M.D.   On: 04/08/2020 12:56    ASSESSMENT: AML.  PLAN:    1.  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  Her peripheral blood flow cytometry also revealed 6% aberrant myeloblasts.  After lengthy discussion with the patient, she agreed to a referral to [facility] for further evaluation and  initiation of treatment.  Once treatment is initiated, patient will likely return for continuation and completion.  She possibly will receive venetoclax and azacitidine.  No follow-up has been scheduled at this time.  Appointment at [facility] is in approximately 1 week. 2.  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 Previously, iron stores, B12, and folate were all within normal limits. Antineutrophil antibodies are positive. referral to [facility] as above. 3. 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  Patient also received chemotherapy and likely received Adriamycin, but exact regimen is unknown.  She completed 5 years of hormonal therapy in approximately June 2015. Currently, she has no evidence of disease.  Her most recent mammogram on March 05, 2020 was reported as BI-RADS 1.  Repeat in November 2022. 4.  History of colon cancer: Patient also states that she received chemotherapy for this, possibly FOLFOX but again this is unknown.   Patient expressed understanding and was in agreement with this plan. She also understands that She can call clinic at any time with any questions, concerns, or complaints.   Cancer Staging History of breast cancer Staging form: Breast, AJCC 7th Edition - Clinical stage from 01/07/2016: Stage IA (T1c, N0, M0) - Signed by Lloyd Huger, MD on 01/07/2016   Lloyd Huger, MD   04/12/2020 6:41 AM

## 2020-04-15 DIAGNOSIS — D72819 Decreased white blood cell count, unspecified: Principal | ICD-10-CM

## 2020-04-16 ENCOUNTER — Ambulatory Visit: Payer: PPO | Admitting: Oncology

## 2020-04-16 ENCOUNTER — Encounter (HOSPITAL_COMMUNITY): Payer: Self-pay | Admitting: Oncology

## 2020-04-17 ENCOUNTER — Telehealth: Payer: Self-pay | Admitting: Physical Therapy

## 2020-04-17 NOTE — Telephone Encounter (Signed)
Called to check on patient and find out if she had determined whether she wanted to continue after she has not attended PT and was checking with Dr. Roland Rack if she should continue. No answer. Left VM asking her to return call and intention of call. Call back number provided: [place]. Graylon Good, PT, DPT 04/17/20, 11:15 AM

## 2020-04-18 ENCOUNTER — Other Ambulatory Visit: Admit: 2020-04-18 | Discharge: 2020-04-19 | Payer: PRIVATE HEALTH INSURANCE

## 2020-04-18 ENCOUNTER — Ambulatory Visit
Admit: 2020-04-18 | Discharge: 2020-04-19 | Payer: PRIVATE HEALTH INSURANCE | Attending: Hematology | Primary: Hematology

## 2020-04-18 DIAGNOSIS — K760 Fatty (change of) liver, not elsewhere classified: Secondary | ICD-10-CM | POA: Insufficient documentation

## 2020-04-18 DIAGNOSIS — C9200 Acute myeloblastic leukemia, not having achieved remission: Secondary | ICD-10-CM | POA: Diagnosis not present

## 2020-04-19 ENCOUNTER — Other Ambulatory Visit: Payer: Self-pay | Admitting: Internal Medicine

## 2020-04-19 DIAGNOSIS — C9200 Acute myeloblastic leukemia, not having achieved remission: Principal | ICD-10-CM

## 2020-04-22 ENCOUNTER — Other Ambulatory Visit: Payer: Self-pay

## 2020-04-22 DIAGNOSIS — C9200 Acute myeloblastic leukemia, not having achieved remission: Principal | ICD-10-CM

## 2020-04-22 MED ORDER — ALLOPURINOL 300 MG TABLET
ORAL_TABLET | Freq: Every day | ORAL | 1 refills | 30.00000 days | Status: CP
Start: 2020-04-22 — End: 2020-05-09

## 2020-04-23 ENCOUNTER — Ambulatory Visit (INDEPENDENT_AMBULATORY_CARE_PROVIDER_SITE_OTHER): Payer: PPO | Admitting: Internal Medicine

## 2020-04-23 DIAGNOSIS — E7800 Pure hypercholesterolemia, unspecified: Secondary | ICD-10-CM

## 2020-04-23 DIAGNOSIS — R109 Unspecified abdominal pain: Secondary | ICD-10-CM

## 2020-04-23 DIAGNOSIS — Z85850 Personal history of malignant neoplasm of thyroid: Secondary | ICD-10-CM | POA: Diagnosis not present

## 2020-04-23 DIAGNOSIS — R945 Abnormal results of liver function studies: Secondary | ICD-10-CM

## 2020-04-23 DIAGNOSIS — F320 Major depressive disorder, single episode, mild: Secondary | ICD-10-CM | POA: Diagnosis not present

## 2020-04-23 DIAGNOSIS — Z85038 Personal history of other malignant neoplasm of large intestine: Secondary | ICD-10-CM | POA: Diagnosis not present

## 2020-04-23 DIAGNOSIS — F419 Anxiety disorder, unspecified: Secondary | ICD-10-CM | POA: Diagnosis not present

## 2020-04-23 DIAGNOSIS — C9200 Acute myeloblastic leukemia, not having achieved remission: Secondary | ICD-10-CM | POA: Diagnosis not present

## 2020-04-23 DIAGNOSIS — R7989 Other specified abnormal findings of blood chemistry: Secondary | ICD-10-CM

## 2020-04-23 DIAGNOSIS — K219 Gastro-esophageal reflux disease without esophagitis: Secondary | ICD-10-CM | POA: Diagnosis not present

## 2020-04-23 DIAGNOSIS — E559 Vitamin D deficiency, unspecified: Secondary | ICD-10-CM

## 2020-04-23 DIAGNOSIS — D649 Anemia, unspecified: Secondary | ICD-10-CM | POA: Diagnosis not present

## 2020-04-23 DIAGNOSIS — Z853 Personal history of malignant neoplasm of breast: Secondary | ICD-10-CM | POA: Diagnosis not present

## 2020-04-23 DIAGNOSIS — F439 Reaction to severe stress, unspecified: Secondary | ICD-10-CM

## 2020-04-23 DIAGNOSIS — F32A Depression, unspecified: Secondary | ICD-10-CM

## 2020-04-23 NOTE — Progress Notes (Signed)
Patient ID: JAZZLIN CLEMENTS, female   DOB: 1951/01/18, 69 y.o.   MRN: [id number]   Subjective:    Patient ID: AMANPREET DELMONT, female    DOB: 01-04-1951, 69 y.o.   MRN: [id number]  HPI This visit occurred during the SARS-CoV-2 public health emergency.  Safety protocols were in place, including screening questions prior to the visit, additional usage of staff PPE, and extensive cleaning of exam room while observing appropriate contact time as indicated for disinfecting solutions.  Patient here for a scheduled follow up.  Recently diagnosed with AML.  Planning treatment. Had concerns about where her treatment will occur.  Prefers to be in the hospital for her treatment.  Plans to contact [facility] hematology.  Increased stress related to this.  Discussed.  Tries to stay active.  No chest pain or sob reported.  No increased cough or congestion.  Some previous abdominal discomfort.  No significant pain.  Some loose stool - occasionally yellow.  Plans to f/u with above - hematology before further w/up.     Past Medical History:  Diagnosis Date   Anxiety    Arthritis    Osteoarthritis   Breast cancer ([place]) 2009   right breast lumpectomy with rad tx   Colon cancer ([place])    surgery with chemo and rad tx   Complication of anesthesia    GERD (gastroesophageal reflux disease)    Heart palpitations    History of hiatal hernia    History of kidney stones    Hypothyroidism    Liver disease    Liver nodule    s/p negative biopsy   Malignant neoplasm of thyroid gland ([place]) 2002   s/p surgery and XRT   Osteoporosis    Other and unspecified hyperlipidemia    Palpitations    Personal history of chemotherapy    Personal history of malignant neoplasm of large intestine    carcinoma - cecum, s/p right laparoscopic colectomy - s/p chemotherapy and XRT   Personal history of radiation therapy    Pneumonia 2019   PONV (postoperative nausea and vomiting)    Pure hypercholesterolemia     Unspecified hereditary and idiopathic peripheral neuropathy    Past Surgical History:  Procedure Laterality Date   APPENDECTOMY  1985   BREAST BIOPSY Right 2009   positive   BREAST BIOPSY Right 2009   negative   BREAST LUMPECTOMY Right 2009   breast cancer   CHOLECYSTECTOMY  1995   COLONOSCOPY     COLONOSCOPY WITH PROPOFOL N/A 10/29/2017   Procedure: COLONOSCOPY WITH PROPOFOL;  Surgeon: Manya Silvas, MD;  Location: [facility] ENDOSCOPY;  Service: Endoscopy;  Laterality: N/A;   DILATION AND CURETTAGE OF UTERUS  1990   DILATION AND CURETTAGE, DIAGNOSTIC / THERAPEUTIC  1990   ESOPHAGOGASTRODUODENOSCOPY     ESOPHAGOGASTRODUODENOSCOPY (EGD) WITH PROPOFOL N/A 10/29/2017   Procedure: ESOPHAGOGASTRODUODENOSCOPY (EGD) WITH PROPOFOL;  Surgeon: Manya Silvas, MD;  Location: [facility] ENDOSCOPY;  Service: Endoscopy;  Laterality: N/A;   LAPAROSCOPIC PARTIAL COLECTOMY     stage 3-C carcinoma of the cecum, s/p chemotherapy and xrt   LITHOTRIPSY     SIGMOIDOSCOPY  08/26/1993   THYROID LOBECTOMY  2002   s/p XRT   TOTAL HIP ARTHROPLASTY Right 10/24/2019   Procedure: TOTAL HIP ARTHROPLASTY;  Surgeon: Corky Mull, MD;  Location: [facility] ORS;  Service: Orthopedics;  Laterality: Right;   Family History  Problem Relation Age of Onset   Stroke Mother        66s  Alzheimer's disease Mother    Cancer Mother    Lung cancer Father    Prostate cancer Father    Cancer Father        Colon   Colon cancer Father    Breast cancer Sister        67's   Lung cancer Sister    Breast cancer Maternal Aunt    Social History   Socioeconomic History   Marital status: Single    Spouse name: Not on file   Number of children: 0   Years of education: Not on file   Highest education level: Not on file  Occupational History   Not on file  Tobacco Use   Smoking status: Never Smoker   Smokeless tobacco: Never Used  Vaping Use   Vaping Use: Never used  Substance and Sexual  Activity   Alcohol use: No    Alcohol/week: 0.0 standard drinks   Drug use: No   Sexual activity: Not Currently  Other Topics Concern   Not on file  Social History Narrative   Not on file   Social Determinants of Health   Financial Resource Strain: Low Risk    Difficulty of Paying Living Expenses: Not hard at all  Food Insecurity: No Food Insecurity   Worried About Charity fundraiser in the Last Year: Never true   [place] in the Last Year: Never true  Transportation Needs: No Transportation Needs   Lack of Transportation (Medical): No   Lack of Transportation (Non-Medical): No  Physical Activity: Not on file  Stress: No Stress Concern Present   Feeling of Stress : Not at all  Social Connections: Unknown   Frequency of Communication with Friends and Family: More than three times a week   Frequency of Social Gatherings with Friends and Family: Not on file   Attends Religious Services: Not on file   Active Member of Clubs or Organizations: Not on file   Attends Archivist Meetings: Not on file   Marital Status: Not on file    Outpatient Encounter Medications as of 04/23/2020  Medication Sig   Azelastine-Fluticasone (DYMISTA) 137-50 MCG/ACT SUSP 2 sprays each nostril q day   clonazePAM (KLONOPIN) 0.5 MG tablet Take 1.5 mg by mouth 2 (two) times daily.    colestipol (COLESTID) 1 g tablet Take 1 g by mouth every morning. TAKES FOR DIARRHEA   escitalopram (LEXAPRO) 20 MG tablet Take 20 mg by mouth every morning.    ibuprofen (ADVIL) 200 MG tablet Take 200 mg by mouth every 8 (eight) hours as needed for moderate pain.   ibuprofen (ADVIL,MOTRIN) 600 MG tablet Take 1 tablet (600 mg total) by mouth every 6 (six) hours as needed. (Patient not taking: Reported on 04/11/2020)   levothyroxine (SYNTHROID) 125 MCG tablet Take 1 tablet (125 mcg total) by mouth daily.   magnesium oxide (MAG-OX) 400 MG tablet TAKE 1 TABLET BY MOUTH EVERY DAY    pantoprazole (PROTONIX) 40 MG tablet TAKE 1 TABLET BY MOUTH EVERY DAY (Patient taking differently: Take 40 mg by mouth every morning.)   potassium chloride (KLOR-CON) 10 MEQ tablet TAKE 1 TABLET BY MOUTH EVERY DAY   Facility-Administered Encounter Medications as of 04/23/2020  Medication   ondansetron (ZOFRAN) tablet 8 mg    Review of Systems     Objective:    Physical Exam  BP 132/70    Pulse 94    Temp 98.1 F (36.7 C) (Oral)    Resp  16    Ht 5' 5"  (1.651 m)    Wt 143 lb (64.9 kg)    SpO2 98%    BMI 23.80 kg/m  Wt Readings from Last 3 Encounters:  04/23/20 143 lb (64.9 kg)  04/11/20 143 lb 3.2 oz (65 kg)  04/08/20 140 lb (63.5 kg)     Lab Results  Component Value Date   WBC 1.6 (L) 04/08/2020   HGB 13.0 04/08/2020   HCT 39.2 04/08/2020   PLT 138 (L) 04/08/2020   GLUCOSE 98 02/20/2020   CHOL 163 02/20/2020   TRIG 110.0 02/20/2020   HDL 40.70 02/20/2020   LDLDIRECT 126.0 07/11/2015   LDLCALC 100 (H) 02/20/2020   ALT 20 02/20/2020   AST 12 02/20/2020   NA 143 02/20/2020   K 3.3 (L) 04/02/2020   CL 105 02/20/2020   CREATININE 0.98 02/20/2020   BUN 13 02/20/2020   CO2 29 02/20/2020   TSH 17.16 (H) 02/20/2020   HGBA1C 5.3 12/10/2016    CT BONE MARROW BIOPSY & ASPIRATION  Result Date: 04/08/2020 INDICATION: 69 year old female with a history of breast cancer status post treatment and new onset leukopenia. She presents for bone marrow biopsy. EXAM: CT GUIDED BONE MARROW ASPIRATION AND CORE BIOPSY Interventional Radiologist:  Criselda Peaches, MD MEDICATIONS: None. ANESTHESIA/SEDATION: Moderate (conscious) sedation was employed during this procedure. A total of 2 milligrams versed and 100 micrograms fentanyl were administered intravenously. The patient's level of consciousness and vital signs were monitored continuously by radiology nursing throughout the procedure under my direct supervision. Total monitored sedation time: 9 minutes FLUOROSCOPY TIME:  None.  COMPLICATIONS: None immediate. Estimated blood loss: <25 mL PROCEDURE: Informed written consent was obtained from the patient after a thorough discussion of the procedural risks, benefits and alternatives. All questions were addressed. Maximal Sterile Barrier Technique was utilized including caps, mask, sterile gowns, sterile gloves, sterile drape, hand hygiene and skin antiseptic. A timeout was performed prior to the initiation of the procedure. The patient was positioned prone and non-contrast localization CT was performed of the pelvis to demonstrate the iliac marrow spaces. Maximal barrier sterile technique utilized including caps, mask, sterile gowns, sterile gloves, large sterile drape, hand hygiene, and betadine prep. Under sterile conditions and local anesthesia, an 11 gauge coaxial bone biopsy needle was advanced into the right iliac marrow space. Needle position was confirmed with CT imaging. Initially, bone marrow aspiration was performed. Next, the 11 gauge outer cannula was utilized to obtain a right iliac bone marrow core biopsy. Needle was removed. Hemostasis was obtained with compression. The patient tolerated the procedure well. Samples were prepared with the cytotechnologist. IMPRESSION: Technically successful CT-guided bone marrow aspiration and core biopsy. Electronically Signed   By: Jacqulynn Cadet M.D.   On: 04/08/2020 12:56       Assessment & Plan:   Problem List Items Addressed This Visit    History of breast cancer    Mammogram 03/07/20 - birads I.       History of thyroid cancer    On thyroid replacement.  Follow tsh.       Hypercholesterolemia    Low cholesterol diet and exercise.  Follow lipid panel.       History of malignant neoplasm of large intestine    Colonoscopy 10/2017.  Being followed by GI. Was due f/u 2020.  Had colonoscopy scheduled recently.  Canceled due to hip surgery.        Anxiety    Increased stress as outlined.  Being  followed by Dr Clovis Riley.   Continues on SSRI and clonazepam.  Follow.       GERD (gastroesophageal reflux disease)    No upper symptoms reported.  On protonix.       Abnormal liver function test    Follow liver function tests.        Stress    Increased stress related to her new diagnosis - AML.  Increased stress related to treatment. Plans to discuss her concerns with hematology.        Mild depression ([place])    On lexapro.  Followed by Dr Clovis Riley.        Vitamin D deficiency    Follow vitamin d level.        Abdominal pain    Has seen GI previously.  Intermittent abdominal discomfort with loose stools (intermittent).  Discussed further evaluation and w/up.  Is better.  Follow.  Wants to pursue above.        Anemia    Follow cbc.  Being followed by hematology.       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  Seeing hematology [facility]).  Planning for treatment.  Follow.           [name], MD

## 2020-04-25 ENCOUNTER — Encounter (HOSPITAL_COMMUNITY): Payer: Self-pay | Admitting: Oncology

## 2020-04-30 ENCOUNTER — Encounter: Payer: Self-pay | Admitting: Internal Medicine

## 2020-04-30 NOTE — Assessment & Plan Note (Signed)
Increased stress as outlined.  Being followed by Dr Lenis Noon.  Continues on SSRI and clonazepam.  Follow.

## 2020-04-30 NOTE — Assessment & Plan Note (Signed)
Follow cbc.  Being followed by hematology.  °

## 2020-04-30 NOTE — Assessment & Plan Note (Signed)
Increased stress related to her new diagnosis - AML.  Increased stress related to treatment. Plans to discuss her concerns with hematology.

## 2020-04-30 NOTE — Assessment & Plan Note (Signed)
On lexapro.  Followed by Dr Levine 

## 2020-04-30 NOTE — Assessment & Plan Note (Signed)
No upper symptoms reported.  On protonix.   

## 2020-04-30 NOTE — Assessment & Plan Note (Signed)
Has seen GI previously.  Intermittent abdominal discomfort with loose stools (intermittent).  Discussed further evaluation and w/up.  Is better.  Follow.  Wants to pursue above.

## 2020-04-30 NOTE — Assessment & Plan Note (Signed)
Follow liver function tests.   

## 2020-04-30 NOTE — Assessment & Plan Note (Addendum)
Colonoscopy 10/2017.  Being followed by GI. Was due f/u 2020.  Had colonoscopy scheduled recently.  Canceled due to hip surgery.

## 2020-04-30 NOTE — Assessment & Plan Note (Signed)
Follow vitamin d level.   

## 2020-04-30 NOTE — Assessment & Plan Note (Signed)
Diagnosed recently.  Seeing hematology [facility]).  Planning for treatment.  Follow.

## 2020-04-30 NOTE — Assessment & Plan Note (Signed)
On thyroid replacement.  Follow tsh.  

## 2020-04-30 NOTE — Assessment & Plan Note (Signed)
Mammogram 03/07/20 - birads I.

## 2020-04-30 NOTE — Assessment & Plan Note (Signed)
Low cholesterol diet and exercise.  Follow lipid panel.   

## 2020-05-02 ENCOUNTER — Ambulatory Visit: Admit: 2020-05-02 | Discharge: 2020-05-03 | Payer: PRIVATE HEALTH INSURANCE

## 2020-05-02 ENCOUNTER — Ambulatory Visit
Admit: 2020-05-02 | Discharge: 2020-05-03 | Payer: PRIVATE HEALTH INSURANCE | Attending: Pharmacist | Primary: Pharmacist

## 2020-05-02 ENCOUNTER — Institutional Professional Consult (permissible substitution)
Admit: 2020-05-02 | Discharge: 2020-05-03 | Payer: PRIVATE HEALTH INSURANCE | Attending: Hematology | Primary: Hematology

## 2020-05-02 DIAGNOSIS — Z85850 Personal history of malignant neoplasm of thyroid: Secondary | ICD-10-CM | POA: Diagnosis not present

## 2020-05-02 DIAGNOSIS — F419 Anxiety disorder, unspecified: Secondary | ICD-10-CM | POA: Diagnosis not present

## 2020-05-02 DIAGNOSIS — C9200 Acute myeloblastic leukemia, not having achieved remission: Secondary | ICD-10-CM | POA: Diagnosis not present

## 2020-05-02 DIAGNOSIS — Z9221 Personal history of antineoplastic chemotherapy: Secondary | ICD-10-CM | POA: Diagnosis not present

## 2020-05-02 DIAGNOSIS — Z85038 Personal history of other malignant neoplasm of large intestine: Secondary | ICD-10-CM | POA: Diagnosis not present

## 2020-05-02 DIAGNOSIS — E890 Postprocedural hypothyroidism: Secondary | ICD-10-CM | POA: Diagnosis not present

## 2020-05-02 DIAGNOSIS — Z853 Personal history of malignant neoplasm of breast: Secondary | ICD-10-CM | POA: Diagnosis not present

## 2020-05-02 MED ORDER — VENETOCLAX 100 MG TABLET
ORAL_TABLET | Freq: Every day | ORAL | 2 refills | 30.00000 days | Status: SS
Start: 2020-05-02 — End: 2020-05-07

## 2020-05-03 ENCOUNTER — Ambulatory Visit
Admit: 2020-05-03 | Discharge: 2020-05-09 | Disposition: A | Discharge status: Home / Self Care | Payer: PRIVATE HEALTH INSURANCE

## 2020-05-03 DIAGNOSIS — K219 Gastro-esophageal reflux disease without esophagitis: Secondary | ICD-10-CM | POA: Diagnosis not present

## 2020-05-03 DIAGNOSIS — E440 Moderate protein-calorie malnutrition: Secondary | ICD-10-CM | POA: Diagnosis not present

## 2020-05-03 DIAGNOSIS — Z5111 Encounter for antineoplastic chemotherapy: Secondary | ICD-10-CM | POA: Diagnosis not present

## 2020-05-03 DIAGNOSIS — Z85850 Personal history of malignant neoplasm of thyroid: Secondary | ICD-10-CM | POA: Diagnosis not present

## 2020-05-03 DIAGNOSIS — Z792 Long term (current) use of antibiotics: Secondary | ICD-10-CM | POA: Diagnosis not present

## 2020-05-03 DIAGNOSIS — C9200 Acute myeloblastic leukemia, not having achieved remission: Secondary | ICD-10-CM | POA: Diagnosis not present

## 2020-05-03 DIAGNOSIS — E559 Vitamin D deficiency, unspecified: Secondary | ICD-10-CM | POA: Diagnosis not present

## 2020-05-03 DIAGNOSIS — Z853 Personal history of malignant neoplasm of breast: Secondary | ICD-10-CM | POA: Diagnosis not present

## 2020-05-03 DIAGNOSIS — K589 Irritable bowel syndrome without diarrhea: Secondary | ICD-10-CM | POA: Diagnosis not present

## 2020-05-03 DIAGNOSIS — E039 Hypothyroidism, unspecified: Secondary | ICD-10-CM | POA: Diagnosis not present

## 2020-05-03 DIAGNOSIS — Z85038 Personal history of other malignant neoplasm of large intestine: Secondary | ICD-10-CM | POA: Diagnosis not present

## 2020-05-03 DIAGNOSIS — Z6824 Body mass index (BMI) 24.0-24.9, adult: Secondary | ICD-10-CM | POA: Diagnosis not present

## 2020-05-03 DIAGNOSIS — F419 Anxiety disorder, unspecified: Secondary | ICD-10-CM | POA: Diagnosis not present

## 2020-05-03 DIAGNOSIS — Z20822 Contact with and (suspected) exposure to covid-19: Secondary | ICD-10-CM | POA: Diagnosis not present

## 2020-05-03 DIAGNOSIS — M810 Age-related osteoporosis without current pathological fracture: Secondary | ICD-10-CM | POA: Diagnosis not present

## 2020-05-03 DIAGNOSIS — D702 Other drug-induced agranulocytosis: Secondary | ICD-10-CM | POA: Diagnosis not present

## 2020-05-03 DIAGNOSIS — R7401 Elevation of levels of liver transaminase levels: Secondary | ICD-10-CM | POA: Diagnosis not present

## 2020-05-03 DIAGNOSIS — K760 Fatty (change of) liver, not elsewhere classified: Secondary | ICD-10-CM | POA: Diagnosis not present

## 2020-05-03 DIAGNOSIS — D701 Agranulocytosis secondary to cancer chemotherapy: Secondary | ICD-10-CM | POA: Diagnosis not present

## 2020-05-03 DIAGNOSIS — Z96641 Presence of right artificial hip joint: Secondary | ICD-10-CM | POA: Diagnosis not present

## 2020-05-03 DIAGNOSIS — Z882 Allergy status to sulfonamides status: Secondary | ICD-10-CM | POA: Diagnosis not present

## 2020-05-03 DIAGNOSIS — F418 Other specified anxiety disorders: Secondary | ICD-10-CM | POA: Diagnosis not present

## 2020-05-03 DIAGNOSIS — T451X5A Adverse effect of antineoplastic and immunosuppressive drugs, initial encounter: Secondary | ICD-10-CM | POA: Diagnosis not present

## 2020-05-03 DIAGNOSIS — G629 Polyneuropathy, unspecified: Secondary | ICD-10-CM | POA: Diagnosis not present

## 2020-05-03 DIAGNOSIS — K580 Irritable bowel syndrome with diarrhea: Secondary | ICD-10-CM | POA: Diagnosis not present

## 2020-05-06 NOTE — Unmapped (Signed)
Hematology/Oncology APP Progress Note:     Admit Date: 05/03/2020   Today's Date: 05/06/2020      Attending Physician :  Wynn Banker, MD  Primary Oncologist: Mariel Aloe, MD  Reason for Admission: Cycle 1 Azacitidine/venetoclax     Patient Active Problem List   Diagnosis   ??? 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CMS-HCC)   ??? Anxiety   ??? Fatty liver disease, nonalcoholic   ??? GERD (gastroesophageal reflux disease)   ??? Peripheral neuropathy   ??? History of malignant neoplasm of breast   ??? History of malignant neoplasm of colon   ??? History of malignant neoplasm of thyroid   ??? Hypothyroidism   ??? Irritable bowel syndrome with diarrhea   ??? Osteoporosis   ??? Status post total hip replacement, right   ??? Vitamin D deficiency     Assessment/Plan: Stephanie Jimenez is an 70 y.o. female with treatment-related AML and PMHx of breast cancer, colon cancer, hypothyroidism s/p resection of thyroid cancer, GERD and anxiety who was admitted for cycle 1 Azacitidine/Venetoclax.    Summary of Today's Plan:  D4=1/3 Cycle 1 Aza/Venetoclax. Tolerating treatment well. No evidence of TLS. Continue Allopurinol and daily TLS lab monitoring. On Valtrex and Levaquin ppx. C. Diff negative, has Imodium PRN. PT/OT following.    Treatment-related AML: Leukopenia incidentally noted in November 2021. Labs obtained around that time showed a normal iron panel, folate, vitamin B12, an elevated TSH, and flow cytometry with 6% myeloblast. She underwent a bone marrow biopsy on December 6th which showed a normocellular bone marrow (30%) involved by therapy-related acute myeloid leukemia (26% blasts by manual aspirate differential) and with a monotypic B-cell population. Myeloid mutation panel significant for NPM1, IDH1, CEBPA (single allele). She presents for cycle 1 Azacitidine/Venetoclax  - D1 = 12/31 Azacitidine/Ventoclax   - Valtrex and Levaquin ppx  - Allopurinol 300mg  daily  - TLS labs daily  [ ]  Plan to dose reduce Venetoclax to 100mg  and start Posaconazole D8  ??  Disposition  - Labs with transfusion support 2x weekly- appointments need to be modified  - Weekly APP visits- need to be modified  - Bone marrow biopsy scheduled 1/20  - Follow up with Mariel Aloe, MD scheduled 1/27  - Venetoclax and Posaconazole access in process  ??  Hypothyroidism s/p thyroid cancer and resection: Diagnosed with thyroid cancer and had resection of thyroid in 2001. She takes Levothyroxine daily. Most recent TSH at [facility] 17.16. Repeat TSH on admission low at 0.064 but FT4 wnl.   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  Patient reported 7 bowel movements yesterday (1/1) that were watery. 1/2 C. Diff negative.  - Imodium PRN  ??  Anxiety: Takes Lexapro 20mg  daily and Klonopin 1.5mg  BID  - Lexapro 20mg  daily  - Klonopin 1.5mg  BID  ??  GERD: Takes Protonix 40mg  daily  - Protonix 40mg  daily  ??  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 Unclear significance in the absence of lymphadenopathy, splenomegaly, or other signs of a lymphoid neoplasm.  ??  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 Works with physical therapy and reports that she will require a L hip replacement at some point.  - PT/OT following  ??  Hx Breast Cancer: diagnosed following colon cancer in her late 7s in her right breast. She underwent lumpectomy and chemotherapy. She  cannot remember what chemotherapy she received for her breast cancer or colon cancer.  ??  Hx Colon Cancer: diagnosed in her 73s. She reports having a partial colectomy and undergoing chemotherapy and radiation.  ??  Leukopenia, impending pancytopenia secondary to disease and chemotherapy:  - Transfuse 1 unit of platelets for platelets <10k  - Transfuse 1 unit RBC if Hgb <7    FEN: reg diet  Standing electrolyte replacement panel for potassium and magnesium    Prophy: ambulation    Code Status: Full Code      Subjective/24hr events:   Afebrile, right arm/bicep pain this morning. Patient reports she has had this arm pain prior to admission and since last summer. She does not remember injuring it. She does not feel it is swollen. She has pain with movement but not sitting still. She thinks it is how she sleeps. She otherwise is doing well.    Review of Systems:  12 point ROS otherwise negative except as above in the HPI.       Objective:  Temp:  [36.4 ??C (97.5 ??F)-37.1 ??C (98.8 ??F)] 37 ??C (98.6 ??F)  Heart Rate:  [78-90] 82  Resp:  [16] 16  BP: (109-130)/(55-66) 109/56  MAP (mmHg):  [72-85] 80  SpO2:  [94 %-100 %] 96 %    Intake/Output this shift:  No intake or output data in the 24 hours ending 05/06/20 0903    Weight:  Wt Readings from Last 1 Encounters:   05/05/20 66.6 kg (146 lb 13.2 oz)       Weight change: 2.2 kg (4 lb 13.6 oz)    Physical Exam:  General: Resting, in no apparent distress, sitting on side of bed  HEENT:  PERRL. No scleral icterus or conjunctival injection. MMM without ulceration, erythema or exudate.    Heart:  RRR. S1, S2. No murmurs, gallops, or rubs.  Lungs: Breathing is unlabored, and patient is speaking full sentences with ease. No stridor. CTAB. No rales, ronchi, or crackles.    Abdomen: No distention or pain on palpation. Bowel sounds are present and normoactive x 4. No palpable hepatomegaly or splenomegaly. No palpable masses.  Skin: No rashes, petechiae or purpura. No areas of skin breakdown. Warm to touch, dry, smooth, and even. Mild erythema at injection sites with mild tenderness to palpation  Musculoskeletal: No grossly-evident joint effusions or deformities. Range of motion about the shoulder, elbow, hips and knees is grossly normal. Pain with ROM of shoulder but full mobility  Psychiatric: Range of affect is appropriate.    Neurologic: Alert and oriented to person, place, time and situation. Gait is not observed. CNII-CNXII grossly intact.  Extremities:  Appear well-perfused. No clubbing, edema, or cyanosis.  CVAD: PIV      Lab Trends:   Recent Labs     05/03/20  0955 05/04/20  0551 05/05/20  0708 05/06/20  0636   WBC 1.8* 1.3* 1.2* 1.2*   NEUTROABS 0.7* 0.6* 0.5* 0.5*   HGB 13.8 13.0 12.6 11.9*   HCT 40.6 39.3 37.5 36.8   PLT 171 156 153 160   CREATININE 0.95* 0.82* 0.89* 0.97*   BUN 15 15 14 12    BILITOT 0.5 0.4  --   --    AST 26 15  --   --    ALT 47 36  --   --    ALKPHOS 133* 121*  --   --    K 3.9 3.3* 3.0* 3.5   MG 1.7 1.7 1.8  1.7   CALCIUM 9.6 9.1 9.4 9.1   NA 140 142 141 140   CL 109* 106 109* 106   CO2 26.0 26.0 24.0 25.0         Imaging: no new imaging today    Medications:   Scheduled Meds:  ??? allopurinoL  300 mg Oral Daily   ??? azaCITIDine  75 mg/m2 (Treatment Plan Recorded) Subcutaneous Q24H   ??? clonazePAM  1.5 mg Oral BID   ??? enoxaparin (LOVENOX) injection  40 mg Subcutaneous Q24H   ??? escitalopram oxalate  20 mg Oral Daily   ??? flu vacc qs2021-22 6mos up(PF)  0.5 mL Intramuscular During hospitalization   ??? levoFLOXacin  500 mg Oral Q24H [facility]   ??? levothyroxine  125 mcg Oral daily   ??? magnesium oxide  1 tablet Oral Daily   ??? ondansetron  8 mg Oral Q24H   ??? pantoprazole  40 mg Oral daily   ??? pneumococcal polysacchride (23-valps)  0.5 mL Subcutaneous During hospitalization   ??? valACYclovir  500 mg Oral Daily   ??? venetoclax  400 mg Oral Q24H     Continuous Infusions:  ??? IP okay to treat     ??? sodium chloride       PRN Meds:.diphenhydrAMINE, EPINEPHrine IM, famotidine (PEPCID) IV, fluticasone propionate, IP okay to treat, loperamide, meperidine, methylPREDNISolone sodium succinate (PF), sodium chloride, sodium chloride 0.9%      Beryle Beams, PA-C  Physician Assistant  Hematology/Oncology    Group pager: [phone]      05/06/20

## 2020-05-07 LAB — BASIC METABOLIC PANEL
ANION GAP: 8 mmol/L (ref 5–14)
BLOOD UREA NITROGEN: 16 mg/dL (ref 9–23)
BUN / CREAT RATIO: 16
CALCIUM: 9.3 mg/dL (ref 8.7–10.4)
CHLORIDE: 107 mmol/L (ref 98–107)
CO2: 27 mmol/L (ref 20.0–31.0)
CREATININE: 0.97 mg/dL — ABNORMAL HIGH
EGFR CKD-EPI AA FEMALE: 68 mL/min/{1.73_m2} (ref >=60)
EGFR CKD-EPI NON-AA FEMALE: 59 mL/min/{1.73_m2} — ABNORMAL LOW (ref >=60)
GLUCOSE RANDOM: 101 mg/dL (ref 70–179)
POTASSIUM: 3.4 mmol/L (ref 3.4–4.5)
SODIUM: 142 mmol/L (ref 135–145)

## 2020-05-07 LAB — CBC W/ AUTO DIFF
BASOPHILS ABSOLUTE COUNT: 0 10*9/L (ref 0.0–0.1)
BASOPHILS RELATIVE PERCENT: 1.2 %
EOSINOPHILS ABSOLUTE COUNT: 0 10*9/L (ref 0.0–0.4)
EOSINOPHILS RELATIVE PERCENT: 1.6 %
HEMATOCRIT: 34.8 % — ABNORMAL LOW (ref 36.0–46.0)
HEMOGLOBIN: 11.4 g/dL — ABNORMAL LOW (ref 12.0–16.0)
LARGE UNSTAINED CELLS: 4 % (ref 0–4)
LYMPHOCYTES ABSOLUTE COUNT: 0.5 10*9/L — ABNORMAL LOW (ref 1.5–5.0)
LYMPHOCYTES RELATIVE PERCENT: 64.5 %
MEAN CORPUSCULAR HEMOGLOBIN CONC: 32.8 g/dL (ref 31.0–37.0)
MEAN CORPUSCULAR HEMOGLOBIN: 27.9 pg (ref 26.0–34.0)
MEAN CORPUSCULAR VOLUME: 84.8 fL (ref 80.0–100.0)
MEAN PLATELET VOLUME: 8.4 fL (ref 7.0–10.0)
MONOCYTES ABSOLUTE COUNT: 0.1 10*9/L — ABNORMAL LOW (ref 0.2–0.8)
MONOCYTES RELATIVE PERCENT: 5.7 %
NEUTROPHILS ABSOLUTE COUNT: 0.2 10*9/L — CL (ref 2.0–7.5)
NEUTROPHILS RELATIVE PERCENT: 23.4 %
PLATELET COUNT: 139 10*9/L — ABNORMAL LOW (ref 150–440)
RED BLOOD CELL COUNT: 4.11 10*12/L (ref 4.00–5.20)
RED CELL DISTRIBUTION WIDTH: 14.9 % (ref 12.0–15.0)
WBC ADJUSTED: 0.8 10*9/L — ABNORMAL LOW (ref 4.5–11.0)

## 2020-05-07 LAB — PHOSPHORUS: PHOSPHORUS: 3.8 mg/dL (ref 2.4–5.1)

## 2020-05-07 LAB — HEPATIC FUNCTION PANEL
ALBUMIN: 2.8 g/dL — ABNORMAL LOW (ref 3.4–5.0)
ALKALINE PHOSPHATASE: 176 U/L — ABNORMAL HIGH (ref 46–116)
ALT (SGPT): 208 U/L — ABNORMAL HIGH (ref 10–49)
AST (SGOT): 168 U/L — ABNORMAL HIGH (ref <=34)
BILIRUBIN DIRECT: 0.3 mg/dL (ref 0.00–0.30)
BILIRUBIN TOTAL: 0.6 mg/dL (ref 0.3–1.2)
PROTEIN TOTAL: 5.7 g/dL (ref 5.7–8.2)

## 2020-05-07 LAB — LACTATE DEHYDROGENASE: LACTATE DEHYDROGENASE: 327 U/L — ABNORMAL HIGH (ref 120–246)

## 2020-05-07 LAB — MAGNESIUM: MAGNESIUM: 1.7 mg/dL (ref 1.6–2.6)

## 2020-05-07 LAB — URIC ACID: URIC ACID: 2.3 mg/dL — ABNORMAL LOW

## 2020-05-07 MED ORDER — VENETOCLAX 100 MG TABLET
ORAL_TABLET | Freq: Every day | ORAL | 2 refills | 30.00000 days | Status: CP
Start: 2020-05-07 — End: 2020-05-15
  Filled 2020-05-10: qty 120, 30d supply, fill #0

## 2020-05-07 MED ADMIN — ondansetron (ZOFRAN) tablet 8 mg: 8 mg | ORAL | @ 16:00:00 | Stop: 2020-05-09

## 2020-05-07 MED ADMIN — clonazePAM (KlonoPIN) tablet 1.5 mg: 1.5 mg | ORAL | @ 14:00:00 | Stop: 2020-05-07

## 2020-05-07 MED ADMIN — allopurinoL (ZYLOPRIM) tablet 300 mg: 300 mg | ORAL | @ 14:00:00 | Stop: 2020-05-07

## 2020-05-07 MED ADMIN — valACYclovir (VALTREX) tablet 500 mg: 500 mg | ORAL | @ 14:00:00 | Stop: 2020-05-09

## 2020-05-07 MED ADMIN — magnesium oxide (MAG-OX) tablet 1 tablet: 1 | ORAL | @ 14:00:00 | Stop: 2020-05-09

## 2020-05-07 MED ADMIN — venetoclax (VENCLEXTA) tablet 400 mg: 400 mg | ORAL | @ 23:00:00 | Stop: 2020-05-09

## 2020-05-07 MED ADMIN — levothyroxine (SYNTHROID) tablet 125 mcg: 125 ug | ORAL | @ 11:00:00 | Stop: 2020-05-09

## 2020-05-07 MED ADMIN — pantoprazole (PROTONIX) EC tablet 40 mg: 40 mg | ORAL | @ 11:00:00 | Stop: 2020-05-09

## 2020-05-07 MED ADMIN — azaCITIDine (VIDAZA) syringe: 75 mg/m2 | SUBCUTANEOUS | @ 17:00:00 | Stop: 2020-05-09

## 2020-05-07 MED ADMIN — levoFLOXacin (LEVAQUIN) tablet 500 mg: 500 mg | ORAL | @ 14:00:00 | Stop: 2020-05-09

## 2020-05-07 MED ADMIN — escitalopram oxalate (LEXAPRO) tablet 20 mg: 20 mg | ORAL | @ 14:00:00 | Stop: 2020-05-09

## 2020-05-07 NOTE — Unmapped (Addendum)
Patient reported feeling unwell at start of shift, complaining of body aches, afebrile, requesting ibuprofen. Overnight provider notified, patient seen. Ibuprofen given 1x with good results. Patient with poor appetite, only interested in eating a piece of cheesecake for dinner. Patient reports no diarrhea overnight. Will continue to monitor.       Problem: Adult Inpatient Plan of Care  Goal: Plan of Care Review  Outcome: Progressing  Flowsheets (Taken 05/07/2020 0540)  Progress: no change  Plan of Care Reviewed With: patient     Problem: Adult Inpatient Plan of Care  Goal: Patient-Specific Goal (Individualized)  Outcome: Progressing  Flowsheets (Taken 05/07/2020 0540)  Individualized Care Needs: Cluster care, 1x dose ibuprofin as requested  Anxieties, Fears or Concerns: concerned about body aches in early evening

## 2020-05-07 NOTE — Unmapped (Addendum)
Stephanie Jimenez is an 70 y.o. female with treatment-related AML and PMHx of breast cancer, colon cancer, hypothyroidism s/p resection of thyroid cancer, GERD and anxiety who was admitted for cycle 1 Azacitidine/Venetoclax. D1 = 12/31. She tolerated treatment well. Her course was complicated by Transaminitis. Acute hepatitis panel negative. She was discharged on D7 with Venetoclax 400mg  daily. She was discharged on Valtrex and Levaquin ppx. Posaconazole was held given LFT elevation. Allopurinol was discontinued on discharge given LFT elevation. She has labs with transfusion support 2x weekly scheduled starting Sunday, 1/9 at [facility]. She has weekly appointments with Markus Jarvis, NP and Kendal Hymen, PharmD scheduled 1/12 and 1/19. She has a bone marrow biopsy scheduled 1/24. She has a follow up with Mariel Aloe, MD scheduled 1/27. An inbasket message was sent to primary outpatient team on day of discharge to provide sign out. Detailed hospital course as below:    Treatment-related AML: Leukopenia incidentally noted in November 2021. Labs obtained around that time showed a normal iron panel, folate, vitamin B12, an elevated TSH, and flow cytometry with 6% myeloblast. She underwent a bone marrow biopsy on December 6th which showed a normocellular bone marrow (30%) involved by therapy-related acute myeloid leukemia (26% blasts by manual aspirate differential) and with a monotypic B-cell population. Myeloid mutation panel significant for NPM1, IDH1, CEBPA (single allele). She presents for cycle 1 Azacitidine/Venetoclax. D1 = 12/31 Azacitidine/Ventoclax. She was started on Valtrex and Levaquin ppx. She continued on Allopurinol initially, but this was discontinued during admission given LFT elevation. TLS labs were checked daily and remained stable. She was discharged on D7 with Venetoclax 400mg  daily. She was discharged on Valtrex and Levaquin ppx. Posaconazole was held given LFT elevation. Allopurinol was discontinued on discharge given LFT elevation. An inbasket message was sent to primary outpatient team on day of discharge to provide sign out.     Disposition  - Labs with transfusion support 2x weekly- scheduled beginning 1/9  - Weekly APP visits- scheduled 1/12 and 1/19 with Markus Jarvis, NP  - Bone marrow biopsy scheduled 1/24  - Follow up with Mariel Aloe, MD scheduled 1/27  - Posaconazole HELD given LFT elevation     Transaminitis: LFTs noted to be 4x ULN on 1/4. Total bili WNL. Patient denies any abdominal pain. Hepatic function panel was obtained daily. An acute hepatitis panel was obtained and was negative. Allopurinol and Posaconazole were held on discharge as above.     Hypothyroidism s/p thyroid cancer and resection: Diagnosed with thyroid cancer and had resection of thyroid in 2001. She takes Levothyroxine daily. Most recent TSH at [facility] 17.16. Repeat TSH on admission low at 0.064 but FT4 wnl. Home dose Levothyroxine continued during admission.     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  Patient reported 7 bowel movements yesterday (1/1) that were watery. 1/2 C. Diff negative. Imodium available PRN.     Anxiety: Takes Lexapro 20mg  daily and Klonopin 0.75mg  BID. Continued during admission.     GERD: Takes Protonix 40mg  daily. Continued during admission.     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 Unclear significance in the absence of lymphadenopathy, splenomegaly, or other signs of a lymphoid neoplasm.     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 Works with physical therapy and reports that she will require a L hip replacement at some point. PT/OT were consulted and followed during admission.  Hx Breast Cancer: diagnosed following colon cancer in her late 37s in her right breast. She underwent lumpectomy and chemotherapy. She cannot remember what chemotherapy she received for her breast cancer or colon cancer.     Hx Colon Cancer: diagnosed in her 46s. She reports having a partial colectomy and undergoing chemotherapy and radiation.     Leukopenia, impending pancytopenia secondary to disease and chemotherapy:  - Transfuse 1 unit of platelets for platelets <10k  - Transfuse 1 unit RBC if Hgb <7

## 2020-05-07 NOTE — Unmapped (Signed)
[facility] Specialty Medication Onboarding    Specialty Medication: VENCLEXTA  Prior Authorization: Not Required   Financial Assistance: Yes - grant approved as secondary   Final Copay/Day Supply: $0 / 30    Insurance Restrictions: None     Notes to Pharmacist:     The triage team has completed the benefits investigation and has determined that the patient is able to fill this medication at [facility]. Please contact the patient to complete the onboarding or follow up with the prescribing physician as needed.

## 2020-05-07 NOTE — Unmapped (Signed)
Hematology/Oncology APP Progress Note:     Admit Date: 05/03/2020   Today's Date: 05/07/2020      Attending Physician :  Wynn Banker, MD  Primary Oncologist: Mariel Aloe, MD  Reason for Admission: Cycle 1 Azacitidine/venetoclax     Patient Active Problem List   Diagnosis   ??? 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CMS-HCC)   ??? Anxiety   ??? Fatty liver disease, nonalcoholic   ??? GERD (gastroesophageal reflux disease)   ??? Peripheral neuropathy   ??? History of malignant neoplasm of breast   ??? History of malignant neoplasm of colon   ??? History of malignant neoplasm of thyroid   ??? Hypothyroidism   ??? Irritable bowel syndrome with diarrhea   ??? Osteoporosis   ??? Status post total hip replacement, right   ??? Vitamin D deficiency     Assessment/Plan: Stephanie Jimenez is an 70 y.o. female with treatment-related AML and PMHx of breast cancer, colon cancer, hypothyroidism s/p resection of thyroid cancer, GERD and anxiety who was admitted for cycle 1 Azacitidine/Venetoclax.    Summary of Today's Plan:  D5=1/4 Cycle 1 Aza/Venetoclax. On Valtrex and Levaquin ppx. Continue Allopurinol and daily TLS lab monitoring. No evidence of TLS. LFTs 4x ULN, increase frequency of monitoring to daily. No dose adjustments needed at this time. C. Diff negative, has Imodium PRN. PT/OT following.    Treatment-related AML: Leukopenia incidentally noted in November 2021. Labs obtained around that time showed a normal iron panel, folate, vitamin B12, an elevated TSH, and flow cytometry with 6% myeloblast. She underwent a bone marrow biopsy on December 6th which showed a normocellular bone marrow (30%) involved by therapy-related acute myeloid leukemia (26% blasts by manual aspirate differential) and with a monotypic B-cell population. Myeloid mutation panel significant for NPM1, IDH1, CEBPA (single allele). She presents for cycle 1 Azacitidine/Venetoclax  - D1 = 12/31 Azacitidine/Ventoclax   - Valtrex and Levaquin ppx  - Allopurinol 300mg  daily  - TLS labs daily  [ ]  Plan to dose reduce Venetoclax to 100mg  and start Posaconazole D8  ??  Disposition  - Labs with transfusion support 2x weekly- requested  - Weekly APP visits- scheduled 1/12 and 1/19 with Markus Jarvis, NP  - Bone marrow biopsy scheduled 1/24  - Follow up with Mariel Aloe, MD scheduled 1/27  - Venetoclax and Posaconazole access in process    Transaminitis: LFTs noted to be 4x ULN on 1/4. Total bili WNL. Patient denies any abdominal pain.  - Daily HFP  ??  Hypothyroidism s/p thyroid cancer and resection: Diagnosed with thyroid cancer and had resection of thyroid in 2001. She takes Levothyroxine daily. Most recent TSH at [facility] 17.16. Repeat TSH on admission low at 0.064 but FT4 wnl.   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  Patient reported 7 bowel movements yesterday (1/1) that were watery. 1/2 C. Diff negative.  - Imodium PRN  ??  Anxiety: Takes Lexapro 20mg  daily and Klonopin 0.75mg  BID  - Lexapro 20mg  daily  - Klonopin 0.75mg  BID  ??  GERD: Takes Protonix 40mg  daily  - Protonix 40mg  daily  ??  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 Unclear significance in the absence of lymphadenopathy, splenomegaly, or other signs of a lymphoid neoplasm.  ??  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 Works with physical therapy and reports that she  will require a L hip replacement at some point.  - PT/OT following  ??  Hx Breast Cancer: diagnosed following colon cancer in her late 70s in her right breast. She underwent lumpectomy and chemotherapy. She cannot remember what chemotherapy she received for her breast cancer or colon cancer.  ??  Hx Colon Cancer: diagnosed in her 66s. She reports having a partial colectomy and undergoing chemotherapy and radiation.  ??  Leukopenia, impending pancytopenia secondary to disease and chemotherapy:  - Transfuse 1 unit of platelets for platelets <10k  - Transfuse 1 unit RBC if Hgb <7    FEN: reg diet  Standing electrolyte replacement panel for potassium and magnesium    Prophy: ambulation    Code Status: Full Code      Subjective/24hr events:   Afebrile, body aches overnight and requested dose of Ibuprofen. Patient reports she just didn't feel well. She can't really describe it and says it wasn't so much a pain or ache, just overall unwell feeling. She feels much better today after Ibuprofen and sleeping. She has no new concerns today. She reports right arm pain is better. She denies chest pain, SOB, n/v/d/c. No belly pain other than soreness at injection sites for chemo.    Review of Systems:  12 point ROS otherwise negative except as above in the HPI.       Objective:  Temp:  [36.4 ??C (97.5 ??F)-36.9 ??C (98.4 ??F)] 36.4 ??C (97.5 ??F)  Heart Rate:  [75-85] 75  Resp:  [16-18] 16  BP: (108-123)/(56-67) 108/67  MAP (mmHg):  [78-79] 79  SpO2:  [95 %-98 %] 98 %    Intake/Output this shift:    Intake/Output Summary (Last 24 hours) at 05/07/2020 0849  Last data filed at 05/06/2020 1400  Gross per 24 hour   Intake 640 ml   Output ???   Net 640 ml       Weight:  Wt Readings from Last 1 Encounters:   05/05/20 66.6 kg (146 lb 13.2 oz)       Weight change:     Physical Exam:  General: Resting, in no apparent distress, sitting up in bed  HEENT:  PERRL. No scleral icterus or conjunctival injection. MMM without ulceration, erythema or exudate.    Heart:  RRR. S1, S2. No murmurs, gallops, or rubs.  Lungs: Breathing is unlabored, and patient is speaking full sentences with ease. No stridor. CTAB. No rales, ronchi, or crackles.    Abdomen: No distention or pain on palpation. Bowel sounds are present and normoactive x 4. No palpable hepatomegaly or splenomegaly. No palpable masses.  Skin: No rashes, petechiae or purpura. No areas of skin breakdown. Warm to touch, dry, smooth, and even. Mild erythema at injection sites with mild tenderness to palpation  Musculoskeletal: No grossly-evident joint effusions or deformities. Range of motion about the shoulder, elbow, hips and knees is grossly normal.   Psychiatric: Range of affect is appropriate.    Neurologic: Alert and oriented to person, place, time and situation. Gait is not observed. CNII-CNXII grossly intact.  Extremities:  Appear well-perfused. No clubbing, edema, or cyanosis.  CVAD: PIV      Lab Trends:   Recent Labs     05/05/20  0708 05/06/20  0636 05/07/20  0622   WBC 1.2* 1.2* 0.8*   NEUTROABS 0.5* 0.5* 0.2*   HGB 12.6 11.9* 11.4*   HCT 37.5 36.8 34.8*   PLT 153 160 139*   CREATININE 0.89* 0.97* 0.97*   BUN  14 12 16    BILITOT  --   --  0.6   AST  --   --  168*   ALT  --   --  208*   ALKPHOS  --   --  176*   K 3.0* 3.5 3.4   MG 1.8 1.7 1.7   CALCIUM 9.4 9.1 9.3   NA 141 140 142   CL 109* 106 107   CO2 24.0 25.0 27.0         Imaging: no new imaging today    Medications:   Scheduled Meds:  ??? allopurinoL  300 mg Oral Daily   ??? azaCITIDine  75 mg/m2 (Treatment Plan Recorded) Subcutaneous Q24H   ??? clonazePAM  1.5 mg Oral BID   ??? enoxaparin (LOVENOX) injection  40 mg Subcutaneous Q24H   ??? escitalopram oxalate  20 mg Oral Daily   ??? flu vacc qs2021-22 6mos up(PF)  0.5 mL Intramuscular During hospitalization   ??? levoFLOXacin  500 mg Oral Q24H [facility]   ??? levothyroxine  125 mcg Oral daily   ??? magnesium oxide  1 tablet Oral Daily   ??? ondansetron  8 mg Oral Q24H   ??? pantoprazole  40 mg Oral daily   ??? pneumococcal polysacchride (23-valps)  0.5 mL Subcutaneous During hospitalization   ??? valACYclovir  500 mg Oral Daily   ??? venetoclax  400 mg Oral Q24H     Continuous Infusions:  ??? Chemo Clarification Order     ??? IP okay to treat     ??? sodium chloride       PRN Meds:.Chemo Clarification Order, diphenhydrAMINE, EPINEPHrine IM, famotidine (PEPCID) IV, fluticasone propionate, IP okay to treat, loperamide, meperidine, methylPREDNISolone sodium succinate (PF), sodium chloride, sodium chloride 0.9%      Beryle Beams, PA-C  Physician Assistant  Hematology/Oncology    Group pager: [phone]      05/07/20

## 2020-05-08 LAB — BASIC METABOLIC PANEL
ANION GAP: 10 mmol/L (ref 5–14)
BLOOD UREA NITROGEN: 13 mg/dL (ref 9–23)
BUN / CREAT RATIO: 15
CALCIUM: 9.4 mg/dL (ref 8.7–10.4)
CHLORIDE: 105 mmol/L (ref 98–107)
CO2: 26 mmol/L (ref 20.0–31.0)
CREATININE: 0.86 mg/dL — ABNORMAL HIGH
EGFR CKD-EPI AA FEMALE: 79 mL/min/{1.73_m2} (ref >=60)
EGFR CKD-EPI NON-AA FEMALE: 69 mL/min/{1.73_m2} (ref >=60)
GLUCOSE RANDOM: 100 mg/dL (ref 70–179)
POTASSIUM: 3.4 mmol/L (ref 3.4–4.5)
SODIUM: 141 mmol/L (ref 135–145)

## 2020-05-08 LAB — CBC W/ AUTO DIFF
BASOPHILS ABSOLUTE COUNT: 0 10*9/L (ref 0.0–0.1)
BASOPHILS RELATIVE PERCENT: 0.9 %
EOSINOPHILS ABSOLUTE COUNT: 0 10*9/L (ref 0.0–0.4)
EOSINOPHILS RELATIVE PERCENT: 1.1 %
HEMATOCRIT: 36.6 % (ref 36.0–46.0)
HEMOGLOBIN: 12 g/dL (ref 12.0–16.0)
LARGE UNSTAINED CELLS: 1 % (ref 0–4)
LYMPHOCYTES ABSOLUTE COUNT: 0.5 10*9/L — ABNORMAL LOW (ref 1.5–5.0)
LYMPHOCYTES RELATIVE PERCENT: 55.7 %
MEAN CORPUSCULAR HEMOGLOBIN CONC: 32.8 g/dL (ref 31.0–37.0)
MEAN CORPUSCULAR HEMOGLOBIN: 27.5 pg (ref 26.0–34.0)
MEAN CORPUSCULAR VOLUME: 84 fL (ref 80.0–100.0)
MEAN PLATELET VOLUME: 8.5 fL (ref 7.0–10.0)
MONOCYTES ABSOLUTE COUNT: 0.1 10*9/L — ABNORMAL LOW (ref 0.2–0.8)
MONOCYTES RELATIVE PERCENT: 5.5 %
NEUTROPHILS ABSOLUTE COUNT: 0.3 10*9/L — CL (ref 2.0–7.5)
NEUTROPHILS RELATIVE PERCENT: 35.5 %
PLATELET COUNT: 157 10*9/L (ref 150–440)
RED BLOOD CELL COUNT: 4.36 10*12/L (ref 4.00–5.20)
RED CELL DISTRIBUTION WIDTH: 15.1 % — ABNORMAL HIGH (ref 12.0–15.0)
WBC ADJUSTED: 1 10*9/L — ABNORMAL LOW (ref 4.5–11.0)

## 2020-05-08 LAB — HEPATITIS PANEL, ACUTE
HEPATITIS A IGM ANTIBODY: NONREACTIVE
HEPATITIS B CORE IGM ANTIBODY: NONREACTIVE
HEPATITIS B SURFACE ANTIGEN: NONREACTIVE
HEPATITIS C ANTIBODY: NONREACTIVE

## 2020-05-08 LAB — URIC ACID: URIC ACID: 2 mg/dL — ABNORMAL LOW

## 2020-05-08 LAB — HEPATIC FUNCTION PANEL
ALBUMIN: 3 g/dL — ABNORMAL LOW (ref 3.4–5.0)
ALKALINE PHOSPHATASE: 162 U/L — ABNORMAL HIGH (ref 46–116)
ALT (SGPT): 156 U/L — ABNORMAL HIGH (ref 10–49)
AST (SGOT): 50 U/L — ABNORMAL HIGH (ref <=34)
BILIRUBIN DIRECT: 0.2 mg/dL (ref 0.00–0.30)
BILIRUBIN TOTAL: 0.5 mg/dL (ref 0.3–1.2)
PROTEIN TOTAL: 5.9 g/dL (ref 5.7–8.2)

## 2020-05-08 LAB — MAGNESIUM: MAGNESIUM: 1.6 mg/dL (ref 1.6–2.6)

## 2020-05-08 LAB — PHOSPHORUS: PHOSPHORUS: 4.1 mg/dL (ref 2.4–5.1)

## 2020-05-08 LAB — LACTATE DEHYDROGENASE: LACTATE DEHYDROGENASE: 167 U/L (ref 120–246)

## 2020-05-08 MED ORDER — VALACYCLOVIR 500 MG TABLET
ORAL_TABLET | Freq: Every day | ORAL | 0 refills | 90.00000 days
Start: 2020-05-08 — End: ?

## 2020-05-08 MED ORDER — LEVOFLOXACIN 500 MG TABLET
ORAL_TABLET | ORAL | 0 refills | 30 days
Start: 2020-05-08 — End: ?

## 2020-05-08 MED ADMIN — venetoclax (VENCLEXTA) tablet 400 mg: 400 mg | ORAL | @ 23:00:00 | Stop: 2020-05-09

## 2020-05-08 MED ADMIN — levoFLOXacin (LEVAQUIN) tablet 500 mg: 500 mg | ORAL | @ 15:00:00 | Stop: 2020-05-09

## 2020-05-08 MED ADMIN — azaCITIDine (VIDAZA) syringe: 75 mg/m2 | SUBCUTANEOUS | @ 16:00:00 | Stop: 2020-05-09

## 2020-05-08 MED ADMIN — valACYclovir (VALTREX) tablet 500 mg: 500 mg | ORAL | @ 15:00:00 | Stop: 2020-05-09

## 2020-05-08 MED ADMIN — clonazePAM (KlonoPIN) tablet 0.75 mg: .75 mg | ORAL | @ 02:00:00 | Stop: 2020-05-09

## 2020-05-08 MED ADMIN — escitalopram oxalate (LEXAPRO) tablet 20 mg: 20 mg | ORAL | @ 15:00:00 | Stop: 2020-05-09

## 2020-05-08 MED ADMIN — ondansetron (ZOFRAN) tablet 8 mg: 8 mg | ORAL | @ 16:00:00 | Stop: 2020-05-09

## 2020-05-08 MED ADMIN — loperamide (IMODIUM) capsule 2 mg: 2 mg | ORAL | @ 22:00:00 | Stop: 2020-05-09

## 2020-05-08 MED ADMIN — levothyroxine (SYNTHROID) tablet 125 mcg: 125 ug | ORAL | @ 11:00:00 | Stop: 2020-05-09

## 2020-05-08 MED ADMIN — pantoprazole (PROTONIX) EC tablet 40 mg: 40 mg | ORAL | @ 11:00:00 | Stop: 2020-05-09

## 2020-05-08 MED ADMIN — clonazePAM (KlonoPIN) tablet 0.75 mg: .75 mg | ORAL | @ 15:00:00 | Stop: 2020-05-09

## 2020-05-08 MED ADMIN — magnesium oxide (MAG-OX) tablet 1 tablet: 1 | ORAL | @ 15:00:00 | Stop: 2020-05-09

## 2020-05-08 MED ADMIN — enoxaparin (LOVENOX) syringe 40 mg: 40 mg | SUBCUTANEOUS | @ 02:00:00 | Stop: 2020-05-09

## 2020-05-08 NOTE — Unmapped (Signed)
No acute events overnight. Patient reports she slept much of the day, initially had difficulty falling asleep though reports getting rest once she did fall asleep. Will be C1 D6 @ 1100.       Problem: Adult Inpatient Plan of Care  Goal: Plan of Care Review  Outcome: Ongoing - Unchanged  Flowsheets (Taken 05/08/2020 3329)  Progress: no change  Plan of Care Reviewed With: patient     Problem: Adult Inpatient Plan of Care  Goal: Patient-Specific Goal (Individualized)  Outcome: Ongoing - Unchanged  Flowsheets (Taken 05/08/2020 0637)  Patient-Specific Goals (Include Timeframe): d/c home after last aza injection on 1/6  Individualized Care Needs: Cluster care, promote rest

## 2020-05-08 NOTE — Unmapped (Signed)
Hematology/Oncology APP Progress Note:     Admit Date: 05/03/2020   Today's Date: 05/08/2020      Attending Physician :  Wynn Banker, MD  Primary Oncologist: Mariel Aloe, MD  Reason for Admission: Cycle 1 Azacitidine/venetoclax     Patient Active Problem List   Diagnosis   ??? 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CMS-HCC)   ??? Anxiety   ??? Fatty liver disease, nonalcoholic   ??? GERD (gastroesophageal reflux disease)   ??? Peripheral neuropathy   ??? History of malignant neoplasm of breast   ??? History of malignant neoplasm of colon   ??? History of malignant neoplasm of thyroid   ??? Hypothyroidism   ??? Irritable bowel syndrome with diarrhea   ??? Osteoporosis   ??? Status post total hip replacement, right   ??? Vitamin D deficiency     Assessment/Plan: Stephanie Jimenez is an 70 y.o. female with treatment-related AML and PMHx of breast cancer, colon cancer, hypothyroidism s/p resection of thyroid cancer, GERD and anxiety who was admitted for cycle 1 Azacitidine/Venetoclax.    Summary of Today's Plan:  D6=1/5 Cycle 1 Aza/Venetoclax. On Valtrex and Levaquin ppx. Holding Allopurinol given LFT elevation. No signs of TLS, will discontinue daily monitoring. LFTs elevated but improved today; continue daily monitoring. Acute hepatitis panel sent, pending. Will plan to hold antifungal ppx on discharge. PT/OT following.    Treatment-related AML: Leukopenia incidentally noted in November 2021. Labs obtained around that time showed a normal iron panel, folate, vitamin B12, an elevated TSH, and flow cytometry with 6% myeloblast. She underwent a bone marrow biopsy on December 6th which showed a normocellular bone marrow (30%) involved by therapy-related acute myeloid leukemia (26% blasts by manual aspirate differential) and with a monotypic B-cell population. Myeloid mutation panel significant for NPM1, IDH1, CEBPA (single allele). She presents for cycle 1 Azacitidine/Venetoclax  - D1 = 12/31 Azacitidine/Ventoclax   - Valtrex and Levaquin ppx  - HOLD Allopurinol 300mg  daily given LFT elevation  - Plan to discharge on Venetoclax 400mg  daily and hold on starting Posaconazole given LFT elevation  ??  Disposition  - Labs with transfusion support 2x weekly- scheduled starting Sunday, 1/9  - Weekly APP visits- scheduled 1/12 and 1/19 with Markus Jarvis, NP and Kendal Hymen, PharmD  - Bone marrow biopsy scheduled 1/24  - Follow up with Mariel Aloe, MD scheduled 1/27  - Venetoclax grant obtained; Posaconazole with manufacturer assistance    Transaminitis: LFTs noted to be 4x ULN on 1/4. Total bili WNL. Patient denies any abdominal pain.  - Daily HFP  [ ]  F/u acute hepatitis panel  ??  Hypothyroidism s/p thyroid cancer and resection: Diagnosed with thyroid cancer and had resection of thyroid in 2001. She takes Levothyroxine daily. Most recent TSH at [facility] 17.16. Repeat TSH on admission low at 0.064 but FT4 wnl.   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  Patient reported 7 bowel movements yesterday (1/1) that were watery. 1/2 C. Diff negative.  - Imodium PRN  ??  Anxiety: Takes Lexapro 20mg  daily and Klonopin 0.75mg  BID  - Lexapro 20mg  daily  - Klonopin 0.75mg  BID  ??  GERD: Takes Protonix 40mg  daily  - Protonix 40mg  daily  ??  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 Unclear significance in the absence of lymphadenopathy, splenomegaly, or other signs of a lymphoid neoplasm.  ??  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 Works with physical therapy and reports that she will require a L hip replacement at some point.  - PT/OT following  ??  Hx Breast Cancer: diagnosed following colon cancer in her late 38s in her right breast. She underwent lumpectomy and chemotherapy. She cannot remember what chemotherapy she received for her breast cancer or colon cancer.  ??  Hx Colon Cancer: diagnosed in her 21s. She reports having a partial colectomy and undergoing chemotherapy and radiation.  ??  Leukopenia, impending pancytopenia secondary to disease and chemotherapy:  - Transfuse 1 unit of platelets for platelets <10k  - Transfuse 1 unit RBC if Hgb <7    FEN: reg diet  Standing electrolyte replacement panel for potassium and magnesium    Prophy: ambulation    Code Status: Full Code      Subjective/24hr events:   Afebrile, NAEON. Patient reports feeling of overall fatigue. She does not have any pain or myalgias, she just has no energy to eat/drink or move around. She is sleeping a lot. She otherwise is doing okay. Injection sites on abdomen remain sore.    Review of Systems:  12 point ROS otherwise negative except as above in the HPI.       Objective:  Temp:  [36.6 ??C (97.9 ??F)-37 ??C (98.6 ??F)] 37 ??C (98.6 ??F)  Heart Rate:  [77-83] 80  Resp:  [16] 16  BP: (111-121)/(59-63) 121/62  MAP (mmHg):  [75-79] 78  SpO2:  [95 %-99 %] 95 %    Intake/Output this shift:    Intake/Output Summary (Last 24 hours) at 05/08/2020 0758  Last data filed at 05/08/2020 0100  Gross per 24 hour   Intake 610 ml   Output ???   Net 610 ml       Weight:  Wt Readings from Last 1 Encounters:   05/05/20 66.6 kg (146 lb 13.2 oz)       Weight change:     Physical Exam:  General: Resting, in no apparent distress, sitting up in bed  HEENT:  PERRL. No scleral icterus or conjunctival injection. MMM without ulceration, erythema or exudate.    Heart:  RRR. S1, S2. No murmurs, gallops, or rubs.  Lungs: Breathing is unlabored, and patient is speaking full sentences with ease. No stridor. CTAB. No rales, ronchi, or crackles.    Abdomen: No distention or pain on palpation. Bowel sounds are present and normoactive x 4. No palpable hepatomegaly or splenomegaly. No palpable masses.  Skin: No rashes, petechiae or purpura. No areas of skin breakdown. Warm to touch, dry, smooth, and even. Mild erythema at injection sites with mild tenderness to palpation  Musculoskeletal: No grossly-evident joint effusions or deformities. Range of motion about the shoulder, elbow, hips and knees is grossly normal.   Psychiatric: Range of affect is appropriate.    Neurologic: Alert and oriented to person, place, time and situation. Gait is not observed. CNII-CNXII grossly intact.  Extremities:  Appear well-perfused. No clubbing, edema, or cyanosis.  CVAD: PIV      Lab Trends:   Recent Labs     05/06/20  0636 05/07/20  0622   WBC 1.2* 0.8*   NEUTROABS 0.5* 0.2*   HGB 11.9* 11.4*   HCT 36.8 34.8*   PLT 160 139*   CREATININE 0.97* 0.97*   BUN 12 16   BILITOT  --  0.6   AST  --  168*   ALT  --  208*  ALKPHOS  --  176*   K 3.5 3.4   MG 1.7 1.7   CALCIUM 9.1 9.3   NA 140 142   CL 106 107   CO2 25.0 27.0         Imaging: no new imaging today    Medications:   Scheduled Meds:  ??? azaCITIDine  75 mg/m2 (Treatment Plan Recorded) Subcutaneous Q24H   ??? clonazePAM  0.75 mg Oral BID   ??? enoxaparin (LOVENOX) injection  40 mg Subcutaneous Q24H   ??? escitalopram oxalate  20 mg Oral Daily   ??? flu vacc [id number] 6mos up(PF)  0.5 mL Intramuscular During hospitalization   ??? levoFLOXacin  500 mg Oral Q24H [facility]   ??? levothyroxine  125 mcg Oral daily   ??? magnesium oxide  1 tablet Oral Daily   ??? ondansetron  8 mg Oral Q24H   ??? pantoprazole  40 mg Oral daily   ??? pneumococcal polysacchride (23-valps)  0.5 mL Subcutaneous During hospitalization   ??? valACYclovir  500 mg Oral Daily   ??? venetoclax  400 mg Oral Q24H     Continuous Infusions:  ??? Chemo Clarification Order     ??? IP okay to treat     ??? sodium chloride       PRN Meds:.Chemo Clarification Order, diphenhydrAMINE, EPINEPHrine IM, famotidine (PEPCID) IV, fluticasone propionate, IP okay to treat, loperamide, meperidine, methylPREDNISolone sodium succinate (PF), sodium chloride, sodium chloride 0.9%      Beryle Beams, PA-C  Physician Assistant  Hematology/Oncology    Group pager: [phone]      05/08/20

## 2020-05-08 NOTE — Unmapped (Signed)
Pt pleasant and cooperative. Pt up ad lib. Plan is for pt to discharge tomorrow after last dose of aza. Pt had no acute events this shift. Pt's PIV replaced; pt tolerated well. VSS. WCTM.    Problem: Adult Inpatient Plan of Care  Goal: Plan of Care Review  Outcome: Ongoing - Unchanged  Goal: Patient-Specific Goal (Individualized)  Outcome: Ongoing - Unchanged  Goal: Absence of Hospital-Acquired Illness or Injury  Outcome: Ongoing - Unchanged  Goal: Optimal Comfort and Wellbeing  Outcome: Ongoing - Unchanged  Goal: Readiness for Transition of Care  Outcome: Progressing  Goal: Rounds/Family Conference  Outcome: Ongoing - Unchanged     Problem: Infection  Goal: Absence of Infection Signs and Symptoms  Outcome: Ongoing - Unchanged  Intervention: Prevent or Manage Infection  Recent Flowsheet Documentation  Taken 05/08/2020 0950 by Tula Nakayama, RN  Infection Management: aseptic technique maintained  Isolation Precautions: protective precautions maintained

## 2020-05-08 NOTE — Unmapped (Signed)
Pt pleasant and cooperative. Pt had no acute events this shift. VSS. Pt up ad lib. WCTM.    Problem: Adult Inpatient Plan of Care  Goal: Plan of Care Review  Outcome: Ongoing - Unchanged  Goal: Patient-Specific Goal (Individualized)  Outcome: Ongoing - Unchanged  Goal: Absence of Hospital-Acquired Illness or Injury  Outcome: Ongoing - Unchanged  Intervention: Identify and Manage Fall Risk  Recent Flowsheet Documentation  Taken 05/07/2020 0919 by Tula Nakayama, RN  Safety Interventions: fall reduction program maintained  Intervention: Prevent Infection  Recent Flowsheet Documentation  Taken 05/07/2020 0919 by Tula Nakayama, RN  Infection Prevention: single patient room provided  Goal: Optimal Comfort and Wellbeing  Outcome: Ongoing - Unchanged  Goal: Readiness for Transition of Care  Outcome: Ongoing - Unchanged  Goal: Rounds/Family Conference  Outcome: Ongoing - Unchanged     Problem: Infection  Goal: Absence of Infection Signs and Symptoms  Outcome: Ongoing - Unchanged  Intervention: Prevent or Manage Infection  Recent Flowsheet Documentation  Taken 05/07/2020 0919 by Tula Nakayama, RN  Infection Management: aseptic technique maintained  Isolation Precautions: protective precautions maintained

## 2020-05-09 LAB — BASIC METABOLIC PANEL
ANION GAP: 7 mmol/L (ref 5–14)
BLOOD UREA NITROGEN: 11 mg/dL (ref 9–23)
BUN / CREAT RATIO: 13
CALCIUM: 10.1 mg/dL (ref 8.7–10.4)
CHLORIDE: 105 mmol/L (ref 98–107)
CO2: 28 mmol/L (ref 20.0–31.0)
CREATININE: 0.83 mg/dL — ABNORMAL HIGH
EGFR CKD-EPI AA FEMALE: 83 mL/min/{1.73_m2} (ref >=60)
EGFR CKD-EPI NON-AA FEMALE: 72 mL/min/{1.73_m2} (ref >=60)
GLUCOSE RANDOM: 103 mg/dL (ref 70–179)
POTASSIUM: 3.2 mmol/L — ABNORMAL LOW (ref 3.4–4.5)
SODIUM: 140 mmol/L (ref 135–145)

## 2020-05-09 LAB — HEPATIC FUNCTION PANEL
ALBUMIN: 3.4 g/dL (ref 3.4–5.0)
ALKALINE PHOSPHATASE: 238 U/L — ABNORMAL HIGH (ref 46–116)
ALT (SGPT): 204 U/L — ABNORMAL HIGH (ref 10–49)
AST (SGOT): 82 U/L — ABNORMAL HIGH (ref <=34)
BILIRUBIN DIRECT: 0.3 mg/dL (ref 0.00–0.30)
BILIRUBIN TOTAL: 0.8 mg/dL (ref 0.3–1.2)
PROTEIN TOTAL: 6.8 g/dL (ref 5.7–8.2)

## 2020-05-09 LAB — CBC W/ AUTO DIFF
BASOPHILS ABSOLUTE COUNT: 0 10*9/L (ref 0.0–0.1)
BASOPHILS RELATIVE PERCENT: 0.6 %
EOSINOPHILS ABSOLUTE COUNT: 0 10*9/L (ref 0.0–0.4)
EOSINOPHILS RELATIVE PERCENT: 1.4 %
HEMATOCRIT: 40 % (ref 36.0–46.0)
HEMOGLOBIN: 13.4 g/dL (ref 12.0–16.0)
LARGE UNSTAINED CELLS: 3 % (ref 0–4)
LYMPHOCYTES ABSOLUTE COUNT: 0.6 10*9/L — ABNORMAL LOW (ref 1.5–5.0)
LYMPHOCYTES RELATIVE PERCENT: 54.9 %
MEAN CORPUSCULAR HEMOGLOBIN CONC: 33.6 g/dL (ref 31.0–37.0)
MEAN CORPUSCULAR HEMOGLOBIN: 27.4 pg (ref 26.0–34.0)
MEAN CORPUSCULAR VOLUME: 81.6 fL (ref 80.0–100.0)
MEAN PLATELET VOLUME: 8.7 fL (ref 7.0–10.0)
MONOCYTES ABSOLUTE COUNT: 0 10*9/L — ABNORMAL LOW (ref 0.2–0.8)
MONOCYTES RELATIVE PERCENT: 3.1 %
NEUTROPHILS ABSOLUTE COUNT: 0.4 10*9/L — CL (ref 2.0–7.5)
NEUTROPHILS RELATIVE PERCENT: 37 %
PLATELET COUNT: 190 10*9/L (ref 150–440)
RED BLOOD CELL COUNT: 4.9 10*12/L (ref 4.00–5.20)
RED CELL DISTRIBUTION WIDTH: 14.9 % (ref 12.0–15.0)
WBC ADJUSTED: 1.1 10*9/L — ABNORMAL LOW (ref 4.5–11.0)

## 2020-05-09 LAB — MAGNESIUM: MAGNESIUM: 1.8 mg/dL (ref 1.6–2.6)

## 2020-05-09 LAB — PHOSPHORUS: PHOSPHORUS: 3.7 mg/dL (ref 2.4–5.1)

## 2020-05-09 MED ORDER — LEVOFLOXACIN 500 MG TABLET
ORAL_TABLET | ORAL | 0 refills | 30.00000 days | Status: CP
Start: 2020-05-09 — End: 2020-05-10

## 2020-05-09 MED ORDER — POSACONAZOLE 100 MG TABLET,DELAYED RELEASE
ORAL_TABLET | Freq: Every day | ORAL | 5 refills | 30.00000 days | Status: CP
Start: 2020-05-09 — End: 2020-05-09

## 2020-05-09 MED ORDER — VALACYCLOVIR 500 MG TABLET
ORAL_TABLET | Freq: Every day | ORAL | 0 refills | 90 days | Status: CP
Start: 2020-05-09 — End: 2020-05-10

## 2020-05-09 MED ADMIN — ondansetron (ZOFRAN) tablet 8 mg: 8 mg | ORAL | @ 16:00:00 | Stop: 2020-05-09

## 2020-05-09 MED ADMIN — valACYclovir (VALTREX) tablet 500 mg: 500 mg | ORAL | @ 13:00:00 | Stop: 2020-05-09

## 2020-05-09 MED ADMIN — clonazePAM (KlonoPIN) tablet 0.75 mg: .75 mg | ORAL | @ 02:00:00 | Stop: 2020-05-09

## 2020-05-09 MED ADMIN — enoxaparin (LOVENOX) syringe 40 mg: 40 mg | SUBCUTANEOUS | @ 02:00:00 | Stop: 2020-05-09

## 2020-05-09 MED ADMIN — pantoprazole (PROTONIX) EC tablet 40 mg: 40 mg | ORAL | @ 12:00:00 | Stop: 2020-05-09

## 2020-05-09 MED ADMIN — clonazePAM (KlonoPIN) tablet 0.75 mg: .75 mg | ORAL | @ 13:00:00 | Stop: 2020-05-09

## 2020-05-09 MED ADMIN — potassium chloride (KLOR-CON) CR tablet 40 mEq: 40 meq | ORAL | @ 19:00:00 | Stop: 2020-05-09

## 2020-05-09 MED ADMIN — azaCITIDine (VIDAZA) syringe: 75 mg/m2 | SUBCUTANEOUS | @ 16:00:00 | Stop: 2020-05-09

## 2020-05-09 MED ADMIN — levothyroxine (SYNTHROID) tablet 125 mcg: 125 ug | ORAL | @ 12:00:00 | Stop: 2020-05-09

## 2020-05-09 MED ADMIN — magnesium oxide (MAG-OX) tablet 1 tablet: 1 | ORAL | @ 13:00:00 | Stop: 2020-05-09

## 2020-05-09 MED ADMIN — levoFLOXacin (LEVAQUIN) tablet 500 mg: 500 mg | ORAL | @ 13:00:00 | Stop: 2020-05-09

## 2020-05-09 MED ADMIN — escitalopram oxalate (LEXAPRO) tablet 20 mg: 20 mg | ORAL | @ 13:00:00 | Stop: 2020-05-09

## 2020-05-09 NOTE — Unmapped (Signed)
Physician Discharge Summary [facility]  4 ONC [facility]  [address]  [place] [address]  Dept: [phone]  Loc: [phone]     Identifying Information:   Stephanie Jimenez  02-07-1951  [id number]    Primary Care Physician: Charm Barges, MD     Referring Physician: [name]     Code Status: Full Code    Admit Date: 05/03/2020    Discharge Date: 05/09/2020     Discharge To: Home    Discharge Service: [facility] - Hematology APP Floor Team (MEDQ)     Discharge Attending Physician: Wynn Banker, MD    Discharge Diagnoses:  Active Problems:    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CMS-HCC)  Resolved Problems:    * No resolved hospital problems. *      Outpatient Provider Follow Up Issues:   Supportive Care Recommendations:  We recommend based on the patient???s underlying diagnosis and treatment history the following supportive care:    1. Antimicrobial prophylaxis:  AML (not in remission): Bacterial: Levofloxacin 500mg  PO daily (absolute neutrophils >/= 0.5 until absolute neutrophils >/= 0.5);   Fungal: AML fungal: None given LFT elevation; Viral: Valacyclovir 500mg  PO daily (continuous)    2. Blood product support:  Leukoreduced blood products are required.  Irradiated blood products are preferred, but in case of urgent transfusion needs non-irradiated blood products may be used:     -  RBC transfusion threshold: transfuse 1 units for Hgb < 8 g/dL.    Based on the patient's disease status and intensity of therapy, complete blood count with differential should be evaluated 2 times per week and used to guide transfusion support    3. Hematopoietic growth factor support: none    Hospital Course:   Stephanie Jimenez is an 70 y.o. female with treatment-related AML and PMHx of breast cancer, colon cancer, hypothyroidism s/p resection of thyroid cancer, GERD and anxiety who was admitted for cycle 1 Azacitidine/Venetoclax. D1 = 12/31. She tolerated treatment well. Her course was complicated by Transaminitis. Acute hepatitis panel negative. She was discharged on D7 with Venetoclax 400mg  daily. She was discharged on Valtrex and Levaquin ppx. Posaconazole was held given LFT elevation. Allopurinol was discontinued on discharge given LFT elevation. She has labs with transfusion support 2x weekly scheduled starting Sunday, 1/9 at [facility]. She has weekly appointments with Markus Jarvis, NP and Kendal Hymen, PharmD scheduled 1/12 and 1/19. She has a bone marrow biopsy scheduled 1/24. She has a follow up with Mariel Aloe, MD scheduled 1/27. An inbasket message was sent to primary outpatient team on day of discharge to provide sign out. Detailed hospital course as below:    Treatment-related AML: Leukopenia incidentally noted in November 2021. Labs obtained around that time showed a normal iron panel, folate, vitamin B12, an elevated TSH, and flow cytometry with 6% myeloblast. She underwent a bone marrow biopsy on December 6th which showed a normocellular bone marrow (30%) involved by therapy-related acute myeloid leukemia (26% blasts by manual aspirate differential) and with a monotypic B-cell population. Myeloid mutation panel significant for NPM1, IDH1, CEBPA (single allele). She presents for cycle 1 Azacitidine/Venetoclax. D1 = 12/31 Azacitidine/Ventoclax. She was started on Valtrex and Levaquin ppx. She continued on Allopurinol initially, but this was discontinued during admission given LFT elevation. TLS labs were checked daily and remained stable. She was discharged on D7 with Venetoclax 400mg  daily. She was discharged on Valtrex and Levaquin ppx. Posaconazole was held given LFT elevation. Allopurinol was discontinued on discharge  given LFT elevation. An inbasket message was sent to primary outpatient team on day of discharge to provide sign out.     Disposition  - Labs with transfusion support 2x weekly- scheduled beginning 1/9  - Weekly APP visits- scheduled 1/12 and 1/19 with Markus Jarvis, NP  - Bone marrow biopsy scheduled 1/24  - Follow up with Mariel Aloe, MD scheduled 1/27  - Posaconazole HELD given LFT elevation     Transaminitis: LFTs noted to be 4x ULN on 1/4. Total bili WNL. Patient denies any abdominal pain. Hepatic function panel was obtained daily. An acute hepatitis panel was obtained and was negative. Allopurinol and Posaconazole were held on discharge as above.     Hypothyroidism s/p thyroid cancer and resection: Diagnosed with thyroid cancer and had resection of thyroid in 2001. She takes Levothyroxine daily. Most recent T[facility] at [facility] 17.16. Repeat TSH on admission low at 0.064 but FT4 wnl. Home dose Levothyroxine continued during admission.     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  Patient reported 7 bowel movements yesterday (1/1) that were watery. 1/2 C. Diff negative. Imodium available PRN.     Anxiety: Takes Lexapro 20mg  daily and Klonopin 0.75mg  BID. Continued during admission.     GERD: Takes Protonix 40mg  daily. Continued during admission.     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 Unclear significance in the absence of lymphadenopathy, splenomegaly, or other signs of a lymphoid neoplasm.     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 Works with physical therapy and reports that she will require a L hip replacement at some point. PT/OT were consulted and followed during admission.     Hx Breast Cancer: diagnosed following colon cancer in her late 59s in her right breast. She underwent lumpectomy and chemotherapy. She cannot remember what chemotherapy she received for her breast cancer or colon cancer.     Hx Colon Cancer: diagnosed in her 75s. She reports having a partial colectomy and undergoing chemotherapy and radiation.     Leukopenia, impending pancytopenia secondary to disease and chemotherapy:  - Transfuse 1 unit of platelets for platelets <10k  - Transfuse 1 unit RBC if Hgb <7      Procedures:  Chemotherapy  No admission procedures for hospital encounter.  ______________________________________________________________________  Discharge Medications:     Your Medication List      STOP taking these medications    allopurinoL 300 MG tablet  Commonly known as: ZYLOPRIM     ibuprofen 200 MG tablet  Commonly known as: ADVIL,MOTRIN     posaconazole 100 mg Tbec delayed released tablet  Commonly known as: NOXAFIL        START taking these medications    levoFLOXacin 500 MG tablet  Commonly known as: LEVAQUIN  Take 1 tablet (500 mg total) by mouth daily.     valACYclovir 500 MG tablet  Commonly known as: VALTREX  Take 1 tablet (500 mg total) by mouth daily.        CONTINUE taking these medications    azelastine-fluticasone 137-50 mcg/spray nasal spray  Commonly known as: DYMISTA  2 sprays each nostril q day     clonazePAM 0.5 MG tablet  Commonly known as: KlonoPIN  Take 0.75 mg by mouth two (2) times a day.     levothyroxine 125 MCG tablet  Commonly known as: SYNTHROID  Take 1 tablet by mouth  daily.     LEXAPRO 20 MG tablet  Generic drug: escitalopram oxalate  Take 20 mg by mouth every morning.     magnesium oxide 400 mg (241.3 mg elemental) tablet  Commonly known as: MAG-OX  Take 1 tablet by mouth daily.     ondansetron 4 MG tablet  Commonly known as: ZOFRAN  Take 8 mg by mouth.     pantoprazole 40 MG tablet  Commonly known as: PROTONIX  Take 1 tablet by mouth daily.     potassium chloride 10 MEQ CR tablet  Commonly known as: KLOR-CON  Take 1 tablet by mouth daily.     venetoclax 100 mg tablet  Commonly known as: VENCLEXTA  Take 4 tablets (400 mg total) by mouth daily. Take with a meal and water. Do not chew, crush, or break tablets.            Allergies:  Demeclocycline, Sulfa (sulfonamide antibiotics), Tetracycline, Amoxicillin-pot clavulanate, Ciprofloxacin, Codeine, Procaine, Epinephrine, Metronidazole, Prednisone, and Tramadol  ______________________________________________________________________  Pending Test Results (if blank, then none):      Most Recent Labs:  All lab results last 24 hours -   Recent Results (from the past 24 hour(s))   Basic Metabolic Panel    Collection Time: 05/09/20  8:05 AM   Result Value Ref Range    Sodium 140 135 - 145 mmol/L    Potassium 3.2 (L) 3.4 - 4.5 mmol/L    Chloride 105 98 - 107 mmol/L    CO2 28.0 20.0 - 31.0 mmol/L    Anion Gap 7 5 - 14 mmol/L    BUN 11 9 - 23 mg/dL    Creatinine 1.61 (H) 0.60 - 0.80 mg/dL    BUN/Creatinine Ratio 13     EGFR CKD-EPI Non-African American, Female 72 >=60 mL/min/1.33m2    EGFR CKD-EPI African American, Female 5 >=60 mL/min/1.37m2    Glucose 103 70 - 179 mg/dL    Calcium 09.6 8.7 - 04.5 mg/dL   Magnesium Level    Collection Time: 05/09/20  8:05 AM   Result Value Ref Range    Magnesium 1.8 1.6 - 2.6 mg/dL   Phosphorus Level    Collection Time: 05/09/20  8:05 AM   Result Value Ref Range    Phosphorus 3.7 2.4 - 5.1 mg/dL   Hepatic Function Panel    Collection Time: 05/09/20  8:05 AM   Result Value Ref Range    Albumin 3.4 3.4 - 5.0 g/dL    Total Protein 6.8 5.7 - 8.2 g/dL    Total Bilirubin 0.8 0.3 - 1.2 mg/dL    Bilirubin, Direct 4.09 0.00 - 0.30 mg/dL    AST 82 (H) <=81 U/L    ALT 204 (H) 10 - 49 U/L    Alkaline Phosphatase 238 (H) 46 - 116 U/L   CBC w/ Differential    Collection Time: 05/09/20  8:05 AM   Result Value Ref Range    WBC 1.1 (L) 4.5 - 11.0 10*9/L    RBC 4.90 4.00 - 5.20 10*12/L    HGB 13.4 12.0 - 16.0 g/dL    HCT 19.1 47.8 - 29.5 %    MCV 81.6 80.0 - 100.0 fL    MCH 27.4 26.0 - 34.0 pg    MCHC 33.6 31.0 - 37.0 g/dL    RDW 62.1 30.8 - 65.7 %    MPV 8.7 7.0 - 10.0 fL    Platelet 190 150 - 440 10*9/L    Variable HGB Concentration  Slight (A) Not Present    Neutrophils % 37.0 %    Lymphocytes % 54.9 %    Monocytes % 3.1 %    Eosinophils % 1.4 %    Basophils % 0.6 %    Neutrophil Left Shift 1+ (A) Not Present    Absolute Neutrophils 0.4 (LL) 2.0 - 7.5 10*9/L    Absolute Lymphocytes 0.6 (L) 1.5 - 5.0 10*9/L    Absolute Monocytes 0.0 (L) 0.2 - 0.8 10*9/L    Absolute Eosinophils 0.0 0.0 - 0.4 10*9/L    Absolute Basophils 0.0 0.0 - 0.1 10*9/L    Large Unstained Cells 3 0 - 4 %    Microcytosis Slight (A) Not Present       Relevant Studies/Radiology (if blank, then none):  No results found.  ______________________________________________________________________            Other Instructions     Call MD for:      Temperature > 38.0 Celsius (> 100.4 Fahrenheit)    Call MD for:  difficulty breathing, headache or visual disturbances      Call MD for:  extreme fatigue      Call MD for:  hives      Call MD for:  persistent dizziness or light-headedness      Call MD for:  persistent nausea or vomiting      Call MD for:  redness, tenderness, or signs of infection (pain, swelling, redness, odor or green/yellow discharge around incision site)      Call MD for:  severe uncontrolled pain      Call MD for:  vaginal bleeding saturating more than 1 pad per hour.      Discharge instructions      Ms. [name] Bottom, you were admitted for your treatment-related Acute Myeloid Leukemia. You received Azacitidine (injection) and Venetoclax (pill). You tolerated treatment well.     New/Important Medications:  - Venetoclax 400mg  daily. Take 4 tablets (400mg  total) daily with food and water. This is your chemotherapy pill. You should START this tonight (1/6)  - Valtrex 500mg  daily - helps to prevent viral infections such as shingles. START this on Friday, 1/7  - Levaquin 500mg  daily to prevent bacterial infections. START this on Friday, 1/7    Please bring all of your medications to your follow up appointments so we can make sure you are taking the correct ones.    Follow up Appointments:   - Labs with transfusion support 2 times weekly at [facility] starting on Sunday, 1/9  Synetta Fail, NP on Wednesday, 1/12 and Wednesday, 1/19  - Bone marrow biopsy scheduled 1/24 at [facility]  - Follow up with Dr. Senaida Ores scheduled 1/27 at [facility]      When to Call Your [facility] Cancer Care Team:   Monday- Friday from 8:00 am - 5:00 pm   On Nights, Weekends and Holidays   Call [phone]     Call [phone]  Or Toll free [phone]    Ask the operator to page the   Ask to speak to the Triage Nurse  Oncology Fellow on Call     RED ZONE:  Take action now!  You need to be seen right away. Call 911 or go to your nearest hospital for help.   - Symptoms are at a severe level of discomfort    - Bleeding that will not stop  - Chest Pain    - Hard to breathe    - Fall or passing out    -  New Seizure    - Thoughts of hurting yourself or others     YELLOW ZONE: Take action today  This is NOT an all-inclusive list. Pleae call with any new or worsening symptoms.   Call your doctor, nurse or other healthcare provider at [phone]  You can be seen by a provider the same day through our Same Day Acute Care for Patients with Cancer program.   - Symptoms are new or worsening; You are not within your goal range for:    - Pain         - Swelling (leg, arm, abdomen, face, neck)    - Shortness of breath       - Skin rash or skin changes    - Bleeding (nose, urine, stool, wound)   - Wound issues (redness, drainage, re-opened)    - Feeling sick to your stomach and throwing up   - Confusion    - Mouth sores/pain in your mouth or throat    - Vision changes   - Hard stool or very loose stools (increase in ostomy  - Fever >100.4 F, chills   Output)       - Worsening cough with mucus that is green, yellow or bloody   - No urine for 12 hours     - Pain or burning when going to the bathroom    - Feeding tube or other catheter/tube issue    - Home infusion pump issue - call [phone]   - Redness or pain at previous IV or port/catheter site    - Depressed or anxiety     GREEN ZONE: You are in control   Your symptoms are under control. Continue to take your medicine as ordered. Keep all visits to the doctor.   - No increase or worsening symptoms   - Able to take your medicine   - Able to drink and eat     For your safety and best care, please DO NOT use MyChart messages to report red or yellow symptoms.   MyChart messages are only checked during weekday normal business hours and you should receive a   Response within 2 business days.   Please use MyChart only for the follows:   - Non-urgent medication refills, scheduling requests or general questions.           Patient Education:     Your blood counts may continue to drop, so it is good to adhere to the following precautions:    - Wash your hands routinely with soap and water  - Take your temperature when you have chills or are not feeling well  - Use a soft toothbrush  - Avoid constipation or straining with bowel movements. This may mean you occasionally need to take over-the-counter stool softeners or laxatives.   - Avoid people who have colds or the flu, or are not feeling well.  - Wear a mask when visiting crowded places.  - Avoid anyone who has received a live vaccination (shot) within the last three weeks  - Maintain a well-balanced diet and eat healthy foods  - Speak with your doctor before having any dental work done  - Limit exposure to pet feces (e.g., litter box)  - Do only as much activity as you can tolerate    Other instructions:  - Don't use dental floss if your platelet count is below 50,000. Your doctor or nurse should tell you if this is the case.  - Use any mouthwashes  given to you as directed.  - If you can't tolerate regular brushing, use an oral swab (bristle-less) toothbrush, or use salt and baking soda to clean your mouth. Mix 1 teaspoon of salt and 1 teaspoon of baking soda into an 8-ounce glass of warm water. Swish and spit.  - Watch your mouth and tongue for white patches. This is a sign of fungal infection, a common side effect of chemotherapy. Be sure to tell your doctor about these patches. Medication/mouthwashes can be prescribed to help you fight the fungal infection. COVID-19 is a new challenge, but [facility] is dedicated to providing you and your loved ones with the best possible cancer care and support in the safest way possible during this time. We made two videos about the ways we are working to keep you safe, such as offering the option to visit your care team over the phone or through a video, as well as support services offered for our patients and their caregivers. If you have any questions about your cancer care, please call your care team.  ??  Video #1: Keeping [facility] Cancer Care patients safe during the COVID-19 crisis  http://go.eabjmlille.com  ??  Video #2: Support for cancer patients and their caregivers during the COVID-19 pandemic  http://go.SecureGap.uy          Follow Up instructions and Outpatient Referrals     Call MD for:      Call MD for:  difficulty breathing, headache or visual disturbances      Call MD for:  extreme fatigue      Call MD for:  hives      Call MD for:  persistent dizziness or light-headedness      Call MD for:  persistent nausea or vomiting      Call MD for:  redness, tenderness, or signs of infection (pain, swelling,   redness, odor or green/yellow discharge around incision site)      Call MD for:  severe uncontrolled pain      Call MD for:  vaginal bleeding saturating more than 1 pad per hour.      Discharge instructions          Appointments which have been scheduled for you    May 12, 2020 10:30 AM  (Arrive by 10:00 AM)  BLOOD TRANSFUSION - 2 UNITS with [employer] CHAIR 08  [place] [facility]COLOG[facility] INF[facility]ION [place] [facility] REGION) [address]  [place] [address]  [phone]      May 15, 2020 11:30 AM  (Arrive by 11:00 AM)  NURSE LAB DRAW with ADULT ONC LAB  [facility] ADULT ONCOLOGY LAB DRAW STATION [place] [facility] REGION) [address]  [place] [address]  [phone]      May 15, 2020 12:30 PM  (Arrive by 12:00 PM)  RETURN ACTIVE [place] with [name], [place]  [facility] HEMATOLOGY ONCOLOGY 2ND FLR CANCER HOSP [facility] REGION) [address]  [place] [address]  [phone]      May 15, 2020  1:00 PM  (Arrive by 12:30 PM)  RETURN ACTIVE  with [name], CPP  [place] HEMATOLOGY ONCOLOGY 2ND FLR CANCER HOSP [facility] REGION) [address]  [place] [address]  [phone]      May 15, 2020  2:00 PM  (Arrive by 1:30 PM)  BLOOD TRANSFUSION - 2 UNITS with ONCINF CHAIR 46  [place] ONCOLOGY INFUSION [place] [facility] REGION) [address]  [place] [address]  [phone]      May 18, 2020 10:30 AM  (Arrive by 10:00 AM)  BLOOD TRANSFUSION - 2 UNITS with [employer] CHAIR 12  [place] ONCOLOGY INFUSION [place] [facility] REGION) [address]  [place] [address]  [phone]      May 22, 2020  9:30 AM  (Arrive by 9:00 AM)  LAB ONLY [place] with ADULT ONC LAB  [facility] ADULT ONCOLOGY LAB DRAW STATION [facility] REGION) [address]  [place] [address]  [phone]      May 22, 2020 10:30 AM  (Arrive by 10:00 AM)  RETURN ACTIVE [place] with [name], [place]  [place] HEMATOLOGY ONCOLOGY 2ND FLR CANCER HOSP [facility] REGION) [address]  [place] [address]  [phone]      May 22, 2020 11:00 AM  (Arrive by 10:30 AM)  RETURN ACTIVE [place] with [name], CPP  [place] HEMATOLOGY ONCOLOGY 2ND FLR CANCER HOSP [facility] REGION) [address]  [place] [address]  [phone]      May 22, 2020 12:00 PM  (Arrive by 11:30 AM)  BLOOD TRANSFUSION - 2 UNITS with ONCINF CHAIR 37  [place] ONCOLOGY INFUSION [place] [facility] REGION) [address]  [place] [address]  [phone]      May 25, 2020 10:00 AM  (Arrive by 9:30 AM)  BLOOD TRANSFUSION - 2 UNITS with [employer] CHAIR 11  [place] ONCOLOGY INFUSION [place] [facility] REGION) [address]  [place] [address]  [phone]      May 27, 2020 10:30 AM  (Arrive by 10:00 AM)  LAB ONLY  with ADULT ONC LAB  [facility] ADULT ONCOLOGY LAB DRAW STATION [place] [facility] REGION) [address]  [place] [address]  [phone]      May 27, 2020 11:30 AM  (Arrive by 11:00 AM)  BONE MARROW BIOPSY with Darel Hong, PA  [place] ONCOLOGY INFUSION [place] [facility] REGION) [address]  [place] [address]  [phone]      May 27, 2020 12:30 PM  (Arrive by 12:00 PM)  BLOOD TRANSFUSION - 2 UNITS with [employer] CHAIR 25  [place] ONCOLOGY INFUSION [place] [facility] REGION) [address]  [place] [address]  [phone]      May 30, 2020 10:30 AM  (Arrive by 10:00 AM)  NURSE LAB DRAW with ADULT ONC LAB  [facility] ADULT ONCOLOGY LAB DRAW STATION [place] [facility] REGION) [address]  [place] [address]  [phone]      May 30, 2020 11:30 AM  (Arrive by 11:00 AM)  RETURN ACTIVE [place] with [name], MD  [facility] HEMATOLOGY ONCOLOGY 2ND FLR CANCER HOSP [facility] REGION) [address]  [place] [address]  [phone]      May 30, 2020 12:30 PM  (Arrive by 12:00 PM)  BLOOD TRANSFUSION - 2 UNITS with ONCINF CHAIR 12  [place] ONCOLOGY INFUSION [place] [facility] REGION) [address]  [place] [address]  [phone]           ______________________________________________________________________  Discharge Day Services:  BP 125/74  - Pulse 74  - Temp 36.5 ??C (97.7 ??F) (Oral)  - Resp 18  - Ht 163 cm (  5' 4.17)  - Wt 66.6 kg (146 lb 13.2 oz)  - SpO2 97%  - BMI 25.07 kg/m??   Pt seen on the day of discharge and determined appropriate for discharge.    Condition at Discharge: good    Length of Discharge: I spent greater than 30 mins in the discharge of this patient.    Beryle Beams, PA-C  Physician Assistant  Hematology/Oncology

## 2020-05-09 NOTE — Unmapped (Signed)
NAEON. Patient sleeping most of the night. Will be C1D7 @ 1100. Plan to discharge today. WCTM.

## 2020-05-09 NOTE — Unmapped (Signed)
Pharmacist Discharge Note  Patient Name: Stephanie Jimenez  Reason for admission: treatment related AML  Reason for writing this note: significant event or rationale that led to medication change, barriers to adherence, patient requires medication-related outpatient intervention and/or monitoring    AML:  - s/p treatment with Aza/Ven, D1 = 12/31 Cycle 1  - hospitalization complicated by elevated LFTs    Highlighted medication changes with rationale (if applicable):  - Start Venetoclax 400 mg daily  - Allopurinol being held due to elevated LFTs  - Posaconazole prophylaxis not started due to elevated LFTs  - Start levofloxacin and valacyclovir prophylaxis    Medication access:  - Venetoclax : Grant approved. First fill dispensed through [facility] for $0 with grant.  - Posaconazole: Manufacturer Assistance approved, however [facility] pharmacy has not processed Rx. Patient instructed not to set up delivery since therapy on hold.    Outpatient follow-up:  [ ]  LFTs   [ ]  Posaconazole start if safe to do so and indicated + reduce venetoclax to 100 mg. Call [facility] to set up delivery of posaconazole: [phone]  [ ]  If changing doses, such as Venetoclax, recommend marking new dose on bottle. If bottle does not match intended dose it could be confusing for the patient. Medication counseling completed x 3. Provided with pill box.    [name]  Student Pharmacist    [name], PharmD, BCOP  Pager [phone]      Future Appointments   Date Time Provider Department Center   05/12/2020 10:30 AM ONCINF CHAIR 08 [id number]   05/15/2020 11:30 AM ADULT ONC LAB [facility]   05/15/2020 12:30 PM [name], AGNP [facility]   05/15/2020  1:00 PM [name], CPP [facility]   05/15/2020  2:00 PM ONCINF CHAIR 46 [facility]   05/18/2020 10:30 AM ONCINF CHAIR 12 [facility]   05/22/2020  9:30 AM ADULT ONC LAB [facility]   05/22/2020 10:30 AM [name], AGNP [facility]   05/22/2020 11:00 AM [name], CPP [facility]   05/22/2020 12:00 PM ONCINF CHAIR 37 [facility]   05/25/2020 10:00 AM ONCINF CHAIR 11 [facility]   05/27/2020 10:30 AM ADULT ONC LAB [facility]   05/27/2020 11:30 AM [name], PA [facility]   05/27/2020 12:30 PM ONCINF CHAIR 25 [facility]   05/30/2020 10:30 AM ADULT ONC LAB [facility]   05/30/2020 11:30 AM [name], MD [facility]   05/30/2020 12:30 PM ONCINF CHAIR 12 [facility]

## 2020-05-09 NOTE — Unmapped (Signed)
Pt remained free of falls, afebrile, VSS. A&Ox4. No complaints of pain. Up ad lib and independent throughout shift. Worked with PT. Plan to discharge home later this shift. WCTM.       Problem: Adult Inpatient Plan of Care  Goal: Plan of Care Review  Outcome: Resolved  Goal: Patient-Specific Goal (Individualized)  Outcome: Resolved  Goal: Absence of Hospital-Acquired Illness or Injury  Outcome: Resolved  Intervention: Identify and Manage Fall Risk  Recent Flowsheet Documentation  Taken 05/09/2020 0710 by Delice Bison, RN  Safety Interventions:   chemotherapeutic agent precautions   fall reduction program maintained   lighting adjusted for tasks/safety   low bed  Intervention: Prevent Infection  Recent Flowsheet Documentation  Taken 05/09/2020 0710 by Delice Bison, RN  Infection Prevention:   cohorting utilized   hand hygiene promoted  Goal: Optimal Comfort and Wellbeing  Outcome: Resolved  Goal: Readiness for Transition of Care  Outcome: Resolved  Goal: Rounds/Family Conference  Outcome: Resolved     Problem: Infection  Goal: Absence of Infection Signs and Symptoms  Outcome: Resolved  Intervention: Prevent or Manage Infection  Recent Flowsheet Documentation  Taken 05/09/2020 0710 by Delice Bison, RN  Infection Management: aseptic technique maintained  Isolation Precautions: protective precautions maintained     Problem: Behavioral Health Comorbidity  Goal: Maintenance of Behavioral Health Symptom Control  Outcome: Resolved

## 2020-05-10 ENCOUNTER — Telehealth: Payer: Self-pay | Admitting: Oncology

## 2020-05-10 MED ORDER — VALACYCLOVIR 500 MG TABLET
ORAL_TABLET | Freq: Every day | ORAL | 0 refills | 90.00000 days | Status: CP
Start: 2020-05-10 — End: 2020-05-21

## 2020-05-10 MED ORDER — LEVOFLOXACIN 500 MG TABLET
ORAL_TABLET | ORAL | 0 refills | 30.00000 days | Status: CP
Start: 2020-05-10 — End: 2020-05-22

## 2020-05-10 MED ORDER — ONDANSETRON HCL 8 MG TABLET
ORAL_TABLET | Freq: Three times a day (TID) | ORAL | 2 refills | 10 days | Status: CP | PRN
Start: 2020-05-10 — End: ?
  Filled 2020-05-10: qty 30, 10d supply, fill #0

## 2020-05-10 NOTE — Unmapped (Signed)
[facility]   Patient Onboarding/Medication Counseling    [name] is a 70 y.o. female with Acute myeloid leukemia not having achieved remission who I am counseling today on initiation of therapy.  I am speaking to the patient.    Was a Nurse, learning disability used for this call? No    Verified patient's date of birth / HIPAA.    Specialty medication(s) to be sent: Hematology/Oncology: Venclexta    Non-specialty medications/supplies to be sent: ondansetron (rx requested)    Medications not needed at this time:  levofloxacin and valacyclovir sent to local pharmacy     Venclexta (Venetoclax)    The patient declined counseling on medication administration, missed dose instructions, goals of therapy, side effects and monitoring parameters, warnings and precautions, drug/food interactions and storage, handling precautions, and disposal because she has taken previously and was counseled while inpatient. The information in the declined sections below are for informational purposes only and was not discussed with patient.     Medication & Administration     Dosage: Take 4 tablets (400 mg total) by mouth daily. Take with a meal and water. Do not chew, crush, or break tablets.    Administration:   ??? Take with a meal.  ??? Swallow whole with a glass of water. Do not break, crush or chew.    ??? Drink plenty of water when taking this drug unless told to drink less water by your doctor. Drink 6 to 8 glasses (about 56 ounces total) of water each day. Start doing this 2 days before your first dose.   ??? Do not stop taking unless instructed to do so by your doctor.    ??? If you throw up after taking a dose, do not repeat the dose. Take your next dose at your normal time.    Adherence/Missed dose instructions: Take a missed dose as soon as you think about.  If it has been more than 8 hours since the missed dose, skip the missed dose and go back to your normal time. Do not take 2 doses at the same time or extra doses.    Goals of Therapy     ??? To prevent disease progression    Side Effects & Monitoring Parameters     Commonly reported side effects  ??? Abdominal pain   ??? Common cold symptoms  ??? Fatigue, loss of strength and energy  ??? Constipation  ??? Muscle pain, joint pain, back pain  ??? Mouth irritation, mouth sores  ??? Restlessness, difficulty seeping    The following side effects should be reported to the provider:  ??? Signs of infection (fever >100.4, chills, mouth sores, sputum production)  ??? Signs of bleeding (vomiting or coughing up blood, blood that looks like coffee grounds, blood in the urine or black, red tarry stools, bruising that gets bigger without reason, any persistent or severe bleeding, impaired wound healing)  ??? Signs of low blood sugar (dizziness, headache, fatigue, feeling weak, shaking, fast heartbeat, confusion, increased hunger or sweating)  ??? Signs of high blood sugar (confusion, fatigue increased thirst, increased hunger, passing a lot of urine, flushing, fast breathing or breath that smells like fruit)  ??? Signs of fluid and electrolyte problems (mood changes, confusion, muscle pain or weakness, abnormal/fast heartbeat, severe dizziness, passing out)  ??? Signs of tumor lysis syndrome (fast heartbeat or abnormal heartbeat; any passing out; unable to pass urine; muscle weakness or cramps; nausea, vomiting, diarrhea or lack of appetite; or feeling sluggish)  ???  Severe headache, dizziness, passing out  ??? Vision changes  ??? Severe loss of energy and strength  ??? Dark urine, urine discoloration, yellow eyes or skin  ??? Severe nausea, vomiting, inability to eat  ??? Severe diarrhea   ??? Shortness of breath  ??? Swelling  ??? Bruising  ??? Pale skin  ??? Signs of anaphylaxis (wheezing, chest tightness, swelling of face, lips, tongue or throat)    Monitoring Parameters:   ??? CBC with differential (throughout treatment)  ??? Blood chemistries (potassium, uric acid, phosphorus, calcium, and creatinine)  ??? Pregnancy test (prior to treatment in females of reproductive potential)  ??? Assess tumor burden, including radiographic evaluation (eg, CT scan) for tumor lysis syndrome (TLS) risk evaluation;   ??? Monitor for signs/symptoms of infection.   ??? Monitor adherence.    Contraindications, Warnings, & Precautions   Contraindications:  ??? Concomitant use with strong CYP3A inhibitors at initiation and during ramp-up phase in patients with chronic lymphocytic leukemia/small lymphocytic lymphoma due to the potential for increased risk of tumor lysis syndrome.   Warnings & Precautions:   ??? Bone marrow suppression: Neutropenia, thrombocytopenia, and anemia may occur. Neutropenic fever has been reported both with monotherapy and combination therapy.   ??? Infection: Serious and fatal infections, including pneumonia and sepsis, have occurred.   ??? Tumor lysis syndrome: Tumor lysis syndrome including fatalities and renal failure requiring dialysis has occurred in patients with high tumor burden when treated with venetoclax.   ??? Hepatic impairment: Dosage adjustment recommended in patients with severe hepatic impairment. Monitor closely for toxicities.  ??? Multiple myeloma: In patients with relapsed or refractory multiple myeloma, an increase in mortality was noted when venetoclax was added to bortezomib and dexamethasone. Venetoclax is not approved for the treatment of multiple myeloma.  ??? Renal impairment: Patients with decreased renal function (CrCl <80 mL/minute) are at increased risk for TLS and may require more intensive TLS prophylaxis and monitoring during treatment initiation and dose escalation.  ??? Immunizations: Live vaccinations should not be administered prior to, during, or after venetoclax treatment until B-cell recovery occurs. Vaccines may be less effective.  ??? Reproductive Considerations: Females of reproductive potential should have a pregnancy test prior to therapy and use effective contraception during treatment and for at least 30 days after the final dose.  Based on the mechanism of action and data from animal reproduction studies, venetoclax is expected to cause fetal harm if administered during pregnancy.  ??? Breastfeeding considerations: It is not known if venetoclax is present in breast milk. Due to the potential for serious adverse reactions in the breastfed infant, breastfeeding is not recommended by the manufacturer.  Drug/Food Interactions     ??? Medication list reviewed in Epic. The patient was instructed to inform the care team before taking any new medications or supplements. No drug interactions identified.   ??? Avoid live vaccines. (venetoclax may diminish the therapeutic effects of live vaccines and enhance the toxic/adverse effects of live vaccines.  ??? Avoid grapefruit and grapefruit juice. (may increase serum concentrations of venetoclax)  ??? Avoid [place] oranges and star fruit. (May increase serum concentrations of venetoclax)    Storage, Handling Precautions, & Disposal   ??? Store at room temperature in the original container (do not use a pillbox or store with other medications).   ??? Caregivers helping administer medication should wear gloves and wash hands immediately after.    ??? Keep the lid tightly closed. Keep out of the reach of children and pets.  ??? Do  not flush down a toilet or pour down a drain unless instructed to do so.  Check with your local police department or fire station about drug take-back programs in your area.      Current Medications (including OTC/herbals), Comorbidities and Allergies     Current Outpatient Medications   Medication Sig Dispense Refill   ??? azelastine-fluticasone (DYMISTA) 137-50 mcg/spray nasal spray 2 sprays each nostril q day     ??? clonazePAM (KLONOPIN) 0.5 MG tablet Take 0.75 mg by mouth two (2) times a day.      ??? escitalopram oxalate (LEXAPRO) 20 MG tablet Take 20 mg by mouth every morning.     ??? levoFLOXacin (LEVAQUIN) 500 MG tablet Take 1 tablet (500 mg total) by mouth daily. 30 tablet 0   ??? levothyroxine (SYNTHROID) 125 MCG tablet Take 1 tablet by mouth daily.     ??? magnesium oxide (MAG-OX) 400 mg (241.3 mg elemental magnesium) tablet Take 1 tablet by mouth daily.     ??? ondansetron (ZOFRAN) 4 MG tablet Take 8 mg by mouth.      ??? pantoprazole (PROTONIX) 40 MG tablet Take 1 tablet by mouth daily.     ??? potassium chloride (KLOR-CON) 10 MEQ CR tablet Take 1 tablet by mouth daily.     ??? valACYclovir (VALTREX) 500 MG tablet Take 1 tablet (500 mg total) by mouth daily. 90 tablet 0   ??? venetoclax (VENCLEXTA) 100 mg tablet Take 4 tablets (400 mg total) by mouth daily. Take with a meal and water. Do not chew, crush, or break tablets. 120 tablet 2     No current facility-administered medications for this visit.       Allergies   Allergen Reactions   ??? Demeclocycline Other (See Comments)     Throat swells  Throat swells     ??? Sulfa (Sulfonamide Antibiotics) Other (See Comments)     Throat swells   ??? Tetracycline Swelling     Throat swelling   ??? Amoxicillin-Pot Clavulanate Diarrhea   ??? Ciprofloxacin Diarrhea   ??? Codeine Dizziness and Other (See Comments)     dizziness     ??? Procaine Other (See Comments)     Shaky   ??? Epinephrine Palpitations     Speeds heart up - panic      ??? Metronidazole Nausea And Vomiting   ??? Prednisone Anxiety     Nervous   Nervous      ??? Tramadol Nausea Only and Other (See Comments)     Sick feeling  Sick feeling         Patient Active Problem List   Diagnosis   ??? 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CMS-HCC)   ??? Anxiety   ??? Fatty liver disease, nonalcoholic   ??? GERD (gastroesophageal reflux disease)   ??? Peripheral neuropathy   ??? History of malignant neoplasm of breast   ??? History of malignant neoplasm of colon   ??? History of malignant neoplasm of thyroid   ??? Hypothyroidism   ??? Irritable bowel syndrome with diarrhea   ??? Osteoporosis   ??? Status post total hip replacement, right   ??? Vitamin D deficiency       Reviewed and up to date in Epic.    Appropriateness of Therapy     Is medication and dose appropriate based on diagnosis? Yes    Prescription has been clinically reviewed: Yes    Baseline Quality of Life Assessment      How many days over the  past month did your Acute myeloid leukemia not having achieved remission   keep you from your normal activities? For example, brushing your teeth or getting up in the morning. Patient declined to answer    Financial Information     Medication Assistance provided: [employer] Assistance    Anticipated copay of $0 / 30 reviewed with patient. Verified delivery address.    Delivery Information     Scheduled delivery date: 05/10/20    Expected start date: 05/10/20    Medication will be delivered via Same Day Courier to the prescription address in [facility].  This shipment will not require a signature.      Explained the services we provide at [name] and that each month we would call to set up refills.  Stressed importance of returning phone calls so that we could ensure they receive their medications in time each month.  Informed patient that we should be setting up refills 7-10 days prior to when they will run out of medication.  A pharmacist will reach out to perform a clinical assessment periodically.  Informed patient that a welcome packet and a drug information handout will be sent.      Patient verbalized understanding of the above information as well as how to contact the pharmacy at [phone] option 4 with any questions/concerns.  The pharmacy is open Monday through Friday 8:30am-4:30pm.  A pharmacist is available 24/7 via pager to answer any clinical questions they may have.    Patient Specific Needs     - Does the patient have any physical, cognitive, or cultural barriers? No    - Patient prefers to have medications discussed with  Patient     - Is the patient or caregiver able to read and understand education materials at a high school level or above? Yes    - Patient's primary language is  English     - Is the patient high risk? Yes, patient is taking oral chemotherapy. Appropriateness of therapy as been assessed    - Does the patient require a Care Management Plan? No     - Does the patient require physician intervention or other additional services (i.e. nutrition, smoking cessation, social work)? No      Jalia Zuniga [facility] Specialty Pharmacist

## 2020-05-10 NOTE — Unmapped (Signed)
Received a call from [name] that she unfortunately did not pick up her prescriptions before discharging yesterday 1/6.  She does not think she can find a ride to [facility] today to pick these up, so med Q re-sent the valtrex and levaquin prescriptions to [facility] [place].  She can get her friend to pick these up today.  As for the venetoclax, I spoke with [facility] and they are rushing her on-boarding to hopefully be able to send out the medication for delivery today.  Instructed patient to keep her phone close and answer all calls.  Updated her temporary address in Epic as she is staying with a friend for the next week or so.

## 2020-05-10 NOTE — Telephone Encounter (Signed)
Pt called to schedule f/u appointment after DC from hospital. Messaged sent to team to return Pt call.

## 2020-05-12 ENCOUNTER — Ambulatory Visit: Admit: 2020-05-12 | Discharge: 2020-05-13 | Payer: PRIVATE HEALTH INSURANCE

## 2020-05-12 DIAGNOSIS — C9200 Acute myeloblastic leukemia, not having achieved remission: Secondary | ICD-10-CM | POA: Diagnosis not present

## 2020-05-12 LAB — CBC W/ AUTO DIFF
BASOPHILS ABSOLUTE COUNT: 0 10*9/L (ref 0.0–0.1)
BASOPHILS RELATIVE PERCENT: 0.7 %
EOSINOPHILS ABSOLUTE COUNT: 0 10*9/L (ref 0.0–0.4)
EOSINOPHILS RELATIVE PERCENT: 0.8 %
HEMATOCRIT: 40 % (ref 36.0–46.0)
HEMOGLOBIN: 12.9 g/dL (ref 12.0–16.0)
LARGE UNSTAINED CELLS: 5 % — ABNORMAL HIGH (ref 0–4)
LYMPHOCYTES ABSOLUTE COUNT: 0.5 10*9/L — ABNORMAL LOW (ref 1.5–5.0)
LYMPHOCYTES RELATIVE PERCENT: 42.9 %
MEAN CORPUSCULAR HEMOGLOBIN CONC: 32.3 g/dL (ref 31.0–37.0)
MEAN CORPUSCULAR HEMOGLOBIN: 27 pg (ref 26.0–34.0)
MEAN CORPUSCULAR VOLUME: 83.6 fL (ref 80.0–100.0)
MEAN PLATELET VOLUME: 8.5 fL (ref 7.0–10.0)
MONOCYTES ABSOLUTE COUNT: 0.1 10*9/L — ABNORMAL LOW (ref 0.2–0.8)
MONOCYTES RELATIVE PERCENT: 7.4 %
NEUTROPHILS ABSOLUTE COUNT: 0.5 10*9/L — ABNORMAL LOW (ref 2.0–7.5)
NEUTROPHILS RELATIVE PERCENT: 43.5 %
PLATELET COUNT: 159 10*9/L (ref 150–440)
RED BLOOD CELL COUNT: 4.79 10*12/L (ref 4.00–5.20)
RED CELL DISTRIBUTION WIDTH: 15.7 % — ABNORMAL HIGH (ref 12.0–15.0)
WBC ADJUSTED: 1.2 10*9/L — ABNORMAL LOW (ref 4.5–11.0)

## 2020-05-12 LAB — COMPREHENSIVE METABOLIC PANEL
ALBUMIN: 3.4 g/dL (ref 3.4–5.0)
ALKALINE PHOSPHATASE: 160 U/L — ABNORMAL HIGH (ref 46–116)
ALT (SGPT): 103 U/L — ABNORMAL HIGH (ref 10–49)
ANION GAP: 6 mmol/L (ref 5–14)
AST (SGOT): 26 U/L (ref <=34)
BILIRUBIN TOTAL: 0.6 mg/dL (ref 0.3–1.2)
BLOOD UREA NITROGEN: 15 mg/dL (ref 9–23)
BUN / CREAT RATIO: 17
CALCIUM: 9.6 mg/dL (ref 8.7–10.4)
CHLORIDE: 104 mmol/L (ref 98–107)
CO2: 29 mmol/L (ref 20.0–31.0)
CREATININE: 0.86 mg/dL — ABNORMAL HIGH
EGFR CKD-EPI AA FEMALE: 79 mL/min/{1.73_m2} (ref >=60)
EGFR CKD-EPI NON-AA FEMALE: 69 mL/min/{1.73_m2} (ref >=60)
GLUCOSE RANDOM: 110 mg/dL — ABNORMAL HIGH (ref 70–99)
POTASSIUM: 3.5 mmol/L (ref 3.4–4.5)
PROTEIN TOTAL: 6.9 g/dL (ref 5.7–8.2)
SODIUM: 139 mmol/L (ref 135–145)

## 2020-05-12 LAB — LACTATE DEHYDROGENASE: LACTATE DEHYDROGENASE: 217 U/L (ref 120–246)

## 2020-05-12 LAB — SLIDE REVIEW

## 2020-05-12 LAB — MAGNESIUM: MAGNESIUM: 1.7 mg/dL (ref 1.6–2.6)

## 2020-05-12 NOTE — Unmapped (Signed)
Patient arrived to triage.  No complaints noted. Pt to have labs drawn and PIV inserted for possible infusion. 24 gauge PIV inserted in left forearm, labs drawn and sent off for analysis, pt tolerated well. Access of PIV intact with blood return. Labs within range. PIV checked for blood return, flushed, and removed. AVS printed. Patient left infusion center, NAD.

## 2020-05-12 NOTE — Unmapped (Signed)
Hospital Outpatient Visit on 05/12/2020   Component Date Value Ref Range Status   ??? Sodium 05/12/2020 139  135 - 145 mmol/L Final   ??? Potassium 05/12/2020 3.5  3.4 - 4.5 mmol/L Final   ??? Chloride 05/12/2020 104  98 - 107 mmol/L Final   ??? Anion Gap 05/12/2020 6  5 - 14 mmol/L Final   ??? CO2 05/12/2020 29.0  20.0 - 31.0 mmol/L Final   ??? BUN 05/12/2020 15  9 - 23 mg/dL Final   ??? Creatinine 05/12/2020 0.86* 0.60 - 0.80 mg/dL Final   ??? BUN/Creatinine Ratio 05/12/2020 17   Final   ??? EGFR CKD-EPI Non-African American,* 05/12/2020 69  >=60 mL/min/1.70m2 Final   ??? EGFR CKD-EPI African American, Fem* 05/12/2020 79  >=60 mL/min/1.63m2 Final   ??? Glucose 05/12/2020 110* 70 - 99 mg/dL Final   ??? Calcium 16/02/9603 9.6  8.7 - 10.4 mg/dL Final   ??? Albumin 54/01/8118 3.4  3.4 - 5.0 g/dL Final   ??? Total Protein 05/12/2020 6.9  5.7 - 8.2 g/dL Final   ??? Total Bilirubin 05/12/2020 0.6  0.3 - 1.2 mg/dL Final   ??? AST 14/78/2956 26  <=34 U/L Final   ??? ALT 05/12/2020 103* 10 - 49 U/L Final   ??? Alkaline Phosphatase 05/12/2020 160* 46 - 116 U/L Final   ??? LDH 05/12/2020 217  120 - 246 U/L Final   ??? Magnesium 05/12/2020 1.7  1.6 - 2.6 mg/dL Final   ??? WBC 21/30/8657 1.2* 4.5 - 11.0 10*9/L Preliminary   ??? RBC 05/12/2020 4.79  4.00 - 5.20 10*12/L Preliminary   ??? HGB 05/12/2020 12.9  12.0 - 16.0 g/dL Preliminary   ??? HCT 05/12/2020 40.0  36.0 - 46.0 % Preliminary   ??? MCV 05/12/2020 83.6  80.0 - 100.0 fL Preliminary   ??? MCH 05/12/2020 27.0  26.0 - 34.0 pg Preliminary   ??? MCHC 05/12/2020 32.3  31.0 - 37.0 g/dL Preliminary   ??? RDW 05/12/2020 15.7* 12.0 - 15.0 % Preliminary   ??? MPV 05/12/2020 8.5  7.0 - 10.0 fL Preliminary   ??? Platelet 05/12/2020 159  150 - 440 10*9/L Preliminary   ??? Hypochromasia 05/12/2020 Slight* Not Present Preliminary

## 2020-05-13 DIAGNOSIS — C9200 Acute myeloblastic leukemia, not having achieved remission: Principal | ICD-10-CM

## 2020-05-13 MED ORDER — VALACYCLOVIR 500 MG TABLET
ORAL_TABLET | Freq: Every day | ORAL | 0 refills | 0 days
Start: 2020-05-13 — End: ?

## 2020-05-13 NOTE — Unmapped (Signed)
Pharmacy needs Dx Code. Sorry was not sure which code to use or I would have sent it for you.  Jenel Lucks

## 2020-05-15 ENCOUNTER — Ambulatory Visit
Admit: 2020-05-15 | Discharge: 2020-05-16 | Payer: PRIVATE HEALTH INSURANCE | Attending: Pharmacist | Primary: Pharmacist

## 2020-05-15 ENCOUNTER — Other Ambulatory Visit: Admit: 2020-05-15 | Discharge: 2020-05-16 | Payer: PRIVATE HEALTH INSURANCE

## 2020-05-15 ENCOUNTER — Ambulatory Visit: Admit: 2020-05-15 | Discharge: 2020-05-16 | Payer: PRIVATE HEALTH INSURANCE

## 2020-05-15 ENCOUNTER — Ambulatory Visit
Admit: 2020-05-15 | Discharge: 2020-05-16 | Payer: PRIVATE HEALTH INSURANCE | Attending: Adult Health | Primary: Adult Health

## 2020-05-15 DIAGNOSIS — C9200 Acute myeloblastic leukemia, not having achieved remission: Secondary | ICD-10-CM | POA: Diagnosis not present

## 2020-05-15 LAB — CBC W/ AUTO DIFF
BASOPHILS ABSOLUTE COUNT: 0 10*9/L (ref 0.0–0.1)
BASOPHILS RELATIVE PERCENT: 1.8 %
EOSINOPHILS ABSOLUTE COUNT: 0 10*9/L (ref 0.0–0.4)
EOSINOPHILS RELATIVE PERCENT: 0.7 %
HEMATOCRIT: 39 % (ref 36.0–46.0)
HEMOGLOBIN: 12.7 g/dL (ref 12.0–16.0)
LARGE UNSTAINED CELLS: 3 % (ref 0–4)
LYMPHOCYTES ABSOLUTE COUNT: 0.6 10*9/L — ABNORMAL LOW (ref 1.5–5.0)
LYMPHOCYTES RELATIVE PERCENT: 46.4 %
MEAN CORPUSCULAR HEMOGLOBIN CONC: 32.4 g/dL (ref 31.0–37.0)
MEAN CORPUSCULAR HEMOGLOBIN: 27.1 pg (ref 26.0–34.0)
MEAN CORPUSCULAR VOLUME: 83.6 fL (ref 80.0–100.0)
MEAN PLATELET VOLUME: 8.9 fL (ref 7.0–10.0)
MONOCYTES ABSOLUTE COUNT: 0.1 10*9/L — ABNORMAL LOW (ref 0.2–0.8)
MONOCYTES RELATIVE PERCENT: 10.8 %
NEUTROPHILS ABSOLUTE COUNT: 0.5 10*9/L — ABNORMAL LOW (ref 2.0–7.5)
NEUTROPHILS RELATIVE PERCENT: 37 %
PLATELET COUNT: 151 10*9/L (ref 150–440)
RED BLOOD CELL COUNT: 4.66 10*12/L (ref 4.00–5.20)
RED CELL DISTRIBUTION WIDTH: 17 % — ABNORMAL HIGH (ref 12.0–15.0)
WBC ADJUSTED: 1.2 10*9/L — ABNORMAL LOW (ref 4.5–11.0)

## 2020-05-15 LAB — MAGNESIUM: MAGNESIUM: 1.8 mg/dL (ref 1.6–2.6)

## 2020-05-15 LAB — COMPREHENSIVE METABOLIC PANEL
ALBUMIN: 3.5 g/dL (ref 3.4–5.0)
ALKALINE PHOSPHATASE: 134 U/L — ABNORMAL HIGH (ref 46–116)
ALT (SGPT): 50 U/L — ABNORMAL HIGH (ref 10–49)
ANION GAP: 8 mmol/L (ref 5–14)
BILIRUBIN TOTAL: 0.7 mg/dL (ref 0.3–1.2)
BLOOD UREA NITROGEN: 14 mg/dL (ref 9–23)
BUN / CREAT RATIO: 15
CALCIUM: 9.4 mg/dL (ref 8.7–10.4)
CHLORIDE: 103 mmol/L (ref 98–107)
CO2: 28 mmol/L (ref 20.0–31.0)
CREATININE: 0.92 mg/dL — ABNORMAL HIGH
EGFR CKD-EPI AA FEMALE: 73 mL/min/{1.73_m2} (ref >=60)
EGFR CKD-EPI NON-AA FEMALE: 63 mL/min/{1.73_m2} (ref >=60)
GLUCOSE RANDOM: 95 mg/dL (ref 70–179)
PROTEIN TOTAL: 7 g/dL (ref 5.7–8.2)
SODIUM: 139 mmol/L (ref 135–145)

## 2020-05-15 LAB — POTASSIUM: POTASSIUM: 3.6 mmol/L (ref 3.4–4.5)

## 2020-05-15 MED ORDER — POSACONAZOLE 100 MG TABLET,DELAYED RELEASE
ORAL_TABLET | Freq: Every day | ORAL | 5 refills | 30.00000 days | Status: CP
Start: 2020-05-15 — End: ?

## 2020-05-15 MED ORDER — VENETOCLAX 100 MG TABLET
ORAL_TABLET | Freq: Every day | ORAL | 0 refills | 30 days | Status: CP
Start: 2020-05-15 — End: ?

## 2020-05-15 NOTE — Unmapped (Signed)
Your schedule is set through your visit with Dr. Senaida Ores on 1/27.    You will get a bone marrow biopsy on 1/24 which will tell us if you are in remission.    - If you ARE in remission, we will continue with your next cycle (7 days of shots plus chemo pill) and every 28 days.     - If you ARE NOT in remission, we will do that next cycle (7 days of shots plus chemo pill), but will check another bone marrow biopsy after that.        After second biopsy (if needed):    - If in remission, continue therapy every 28 days.     - If not in remission, you will meet with Dr. Senaida Ores to discuss plan moving forward.    You DO NOT need to come on Saturday for labs and transfusion.  I do not expect your counts to drop so quickly that you will need one in 3 days.    If you need [place] during the week/business hours, call [phone] for our triage line.    If you need someone urgently over the weekend or at night, call [phone] and ask for the Adult Hematology Oncology Fellow On Call.      If you have any questions, please do not hesitate to contact us.    **Fevers over 100.4 are an EMERGENCY. Please go to the nearest ER**    If you experience new or worsening of the following symptoms, please call:  1. Night sweats  2. Fevers/Chills  3. Bleeding not stopped by holding pressure    When reviewing your results, please remember that the results of many of the tests we order can vary somewhat and that variation often means nothing.  Sometimes when we get results back after your clinic visit, if it looks like there???s some variation of that type, we may decide to recheck things sooner than we discussed in clinic.  If you get a call that we want to recheck things sooner, do not panic. It does not mean that things are going wrong.    For appointments & questions Monday through Friday 8 AM??? 5 PM   please call [phone] or Toll free [phone].    On Nights, Weekends and Holidays  Call [phone] and ask for the adult hematologist/oncologist on call.    Markus Jarvis, AGPCNP-C, MSN, OCN  Nurse Practitioner  Hematologic Malignancies  [facility]    Nurse Navigator: Wynona Meals, RN  Questions and appointments M-F 8am - 5pm: [phone] or [phone]    [facility]  [address]  [place], [place] [address]  [email or web address]    Results for orders placed or performed in visit on 05/15/20   CBC w/ Differential   Result Value Ref Range    WBC 1.2 (L) 4.5 - 11.0 10*9/L    RBC 4.66 4.00 - 5.20 10*12/L    HGB 12.7 12.0 - 16.0 g/dL    HCT 38.1 01.7 - 51.0 %    MCV 83.6 80.0 - 100.0 fL    MCH 27.1 26.0 - 34.0 pg    MCHC 32.4 31.0 - 37.0 g/dL    RDW 25.8 (H) 52.7 - 15.0 %    MPV 8.9 7.0 - 10.0 fL    Platelet 151 150 - 440 10*9/L    Neutrophils % 37.0 %    Lymphocytes % 46.4 %    Monocytes % 10.8 %    Eosinophils %  0.7 %    Basophils % 1.8 %    Neutrophil Left Shift 1+ (A) Not Present    Absolute Neutrophils 0.5 (L) 2.0 - 7.5 10*9/L    Absolute Lymphocytes 0.6 (L) 1.5 - 5.0 10*9/L    Absolute Monocytes 0.1 (L) 0.2 - 0.8 10*9/L    Absolute Eosinophils 0.0 0.0 - 0.4 10*9/L    Absolute Basophils 0.0 0.0 - 0.1 10*9/L    Large Unstained Cells 3 0 - 4 %    Anisocytosis Slight (A) Not Present    Hypochromasia Slight (A) Not Present

## 2020-05-15 NOTE — Unmapped (Addendum)
Starting tomorrow, please START posaconazole (Noxafil). You should take 3 tablets (300 mg) once daily.    You will have to DOSE REDUCE the venetoclax (Venclexta) to just 1 tablet (100 mg) once daily. Take with food.     For diarrhea, please start imodium (can follow the labeling on the package). You can also start metamucil to help bind up the stool, but if diarrhea gets worse or not better, stop the metamucil and stick with the imodium    Use your ondansetron (Zofran) for nausea! It can help turn around diarrhea too.     We will see what your potassium level is and may adjust your dose accordingly.     Lab on 05/15/2020   Component Date Value Ref Range Status   ??? Sodium 05/15/2020 139  135 - 145 mmol/L Final   ??? Potassium 05/15/2020    Final    Specimen Hemolyzed     ??? Chloride 05/15/2020 103  98 - 107 mmol/L Final   ??? Anion Gap 05/15/2020 8  5 - 14 mmol/L Final   ??? CO2 05/15/2020 28.0  20.0 - 31.0 mmol/L Final   ??? BUN 05/15/2020 14  9 - 23 mg/dL Final   ??? Creatinine 05/15/2020 0.92* 0.60 - 0.80 mg/dL Final   ??? BUN/Creatinine Ratio 05/15/2020 15   Final   ??? EGFR CKD-EPI Non-African American,* 05/15/2020 63  >=60 mL/min/1.28m2 Final   ??? EGFR CKD-EPI African American, Fem* 05/15/2020 73  >=60 mL/min/1.70m2 Final   ??? Glucose 05/15/2020 95  70 - 179 mg/dL Final   ??? Calcium 16/02/9603 9.4  8.7 - 10.4 mg/dL Final   ??? Albumin 54/01/8118 3.5  3.4 - 5.0 g/dL Final   ??? Total Protein 05/15/2020 7.0  5.7 - 8.2 g/dL Final   ??? Total Bilirubin 05/15/2020 0.7  0.3 - 1.2 mg/dL Final   ??? AST 14/78/2956    Final    Specimen Hemolyzed     ??? ALT 05/15/2020 50* 10 - 49 U/L Final   ??? Alkaline Phosphatase 05/15/2020 134* 46 - 116 U/L Final   ??? Magnesium 05/15/2020 1.8  1.6 - 2.6 mg/dL Final   ??? WBC 21/30/8657 1.2* 4.5 - 11.0 10*9/L Final   ??? RBC 05/15/2020 4.66  4.00 - 5.20 10*12/L Final   ??? HGB 05/15/2020 12.7  12.0 - 16.0 g/dL Final   ??? HCT 84/69/6295 39.0  36.0 - 46.0 % Final   ??? MCV 05/15/2020 83.6  80.0 - 100.0 fL Final   ??? MCH 05/15/2020 27.1  26.0 - 34.0 pg Final   ??? MCHC 05/15/2020 32.4  31.0 - 37.0 g/dL Final   ??? RDW 28/41/3244 17.0* 12.0 - 15.0 % Final   ??? MPV 05/15/2020 8.9  7.0 - 10.0 fL Final   ??? Platelet 05/15/2020 151  150 - 440 10*9/L Final   ??? Neutrophils % 05/15/2020 37.0  % Final   ??? Lymphocytes % 05/15/2020 46.4  % Final   ??? Monocytes % 05/15/2020 10.8  % Final   ??? Eosinophils % 05/15/2020 0.7  % Final   ??? Basophils % 05/15/2020 1.8  % Final   ??? Neutrophil Left Shift 05/15/2020 1+* Not Present Final   ??? Absolute Neutrophils 05/15/2020 0.5* 2.0 - 7.5 10*9/L Final   ??? Absolute Lymphocytes 05/15/2020 0.6* 1.5 - 5.0 10*9/L Final   ??? Absolute Monocytes 05/15/2020 0.1* 0.2 - 0.8 10*9/L Final   ??? Absolute Eosinophils 05/15/2020 0.0  0.0 - 0.4 10*9/L Final   ??? Absolute  Basophils 05/15/2020 0.0  0.0 - 0.1 10*9/L Final   ??? Large Unstained Cells 05/15/2020 3  0 - 4 % Final   ??? Anisocytosis 05/15/2020 Slight* Not Present Final   ??? Hypochromasia 05/15/2020 Slight* Not Present Final

## 2020-05-15 NOTE — Unmapped (Unsigned)
1245 - Blood drawn from Left FA by Marylouise Stacks., RN with PIV insertion.  Specimens sent to lab for analysis.

## 2020-05-15 NOTE — Unmapped (Signed)
[facility] Leukemia Clinic Follow-up Visit Note     Patient Name: Stephanie Jimenez  Patient Age: 70 y.o.  Encounter Date: 05/15/2020    Primary Care Provider:  Charm Barges, MD    Referring Physician:  Leia Alf, MD  [address]  [facility] [address] [facility],  [place] [address]    Reason for visit:  Establish AML    Current therapy:   None    Assessment/Plan:  Stephanie Jimenez is a 70 y.o. female with past medical history of anxiety, breast cancer, colon cancer, hypothyroidism, and GERD who presents to establish care for AML. Diagnosed on bone marrow biopsy on 04/08/2020 while undergoing evaluation for persistent leukopenia. She has a remote history (~10-15 years ago) of systemic chemotherapy for breast and colon cancer which raises suspicion of therapy-related AML. However, cytogenetics were not specific or supportive of this diagnosis. At this time mutational profile is still pending.    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 2010, Blood, PMID: 40981191) . For older patients (age > 57) unable to undergo intensive induction chemotherapy, the use of venetoclax in combination with an HMA (decitabine or azacitidine) resulted in an CR+CRi of 73%, with a median duration of 11.3 months, and a median overall survival of 17.5 months (DiNardo et al. 2019, Blood, PMID: 47829562). Both, inpatient (high intensity induction chemotherapy) and outpatient (AZA/Ven) treatment options were presented and discussed with the patient. Through shared decision making we decided to go forward with AZA/Ven.    Additionally, after our initial visit with the patient, she was found to have an NPM1 mutation, along with an IDH1 mutation and CEBPA mutation.  These mutations together confer favorable response to AZA/VEN (ignoring the potential for treatment related AML).  Older (>65 years) patients with NPM1 mutated AML had a 88% CR rate with AZA/VEN, and a mOS not reached (Lachowiez, Blood Advances 2020).  IDH1 mutations are also known to confer increased response rates to venetoclax.      Ms. Stephanie Jimenez is doing well.  She tolerated ramp up with Aza/Ven fairly well, aside from mild LFT elevations. Due to this, posa was not started prior to discharge. These are stable today.  She did not have any TLS.  No transfusions necessary today - will cancel her Saturday appointment due to impending inclement weather.  Diarrhea can be managed by imodium or metamucil.  She'll see [place] again next week.    Posa will be started today per pharmacy.      Therapy-related AML: NPM1, IDH1, CEBPA (single allele) mutated.  By ELN criteria (again ignoring the history of chemotherapy), this would be favorable risk disease with a high likelihood of responding to chemotherapy.  It is unclear how her prior history of chemotherapy will contribute to her disease response.    - AZA/VEN - C1D1 12/31 today C1D13  - Start posaconazole, reduce ven  - Bmbx 1/24 (scheduled)  - Follow up with Dr. 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 Unclear significance in the absence of lymphadenopathy, splenomegaly, or other signs of a lymphoid neoplasm.  - CTM    Hypothyroidism: Secondary to treatment of thyroid cancer.  - Consider repeat TSH in 4-weeks which was elevated on most recent testing  - Continue levothyroxine    Psychosocial distress: She reports a high level of anxiety regarding the management of the above.   - Counseling given   - Consider ref to comprehensive cancer support program     Patient-centered care/Shared decision-making:   We discussed the plan above at length. The patient and her friend actively contributed to the conversation. Specifically, her most important outcomes are:   1) Prolonging OS  2) Avoiding Long-term side effects   3) Maintaining her overall functional activity     Supportive Care Needs: We recommend based on the patient???s underlying diagnosis and treatment history the following supportive care:    1. Antimicrobial prophylaxis:  None    2. Blood product support:  Leukoreduced blood products are required.  Irradiated blood products are preferred, but in case of urgent transfusion needs non-irradiated blood products may be used: 2 units for Hg <= 7.0.     Coordination of care:   Plan for weekly labs  Follow up next week with me and Kendal Hymen, CPP  BMBx 1/24  Dr. Senaida Ores 1/27    Markus Jarvis, RN, MSN, AGPCNP-C  Nurse Practitioner  Hematologic Malignancies  [facility]  [phone] (phone)  [phone] (fax)  [email or web address]      I personally spent 45 minutes face-to-face and non-face-to-face in the care of this patient, which includes all pre, intra, and post visit time on the date of service.      Mariel Aloe, MD was available  Leukemia Program  Division of Hematology  [facility]      Nurse Navigator (non-clinical trial patients): Wynona Meals, RN        Tel. [phone]       Fax. [phone]  Toll-free appointments: [phone]  Scheduling assistance: [phone]  After hours/weekends: [phone] (ask for adult hematology/oncology on-call)          History of Present Illness:   Stephanie Jimenez is a 70 y.o. female with past medical history noted as above who presents to establish care for AML.    She presented for evaluation of incidentally noted leukopenia in November 2021. Labs obtained around that time showed a normal iron panel, folate, vitamin B12, an elevated TSH, and flow cytometry with 6% myeloblast. She underwent a bone marrow biopsy on December 6th which showed a normocellular bone marrow (30%) involved by therapy-related acute myeloid leukemia (26% blasts by manual aspirate differential) and with a monotypic B-cell population. Review of the EMR shows that her leukopenia dates back to at least June 2020. She reports having a viral illness at the end of 2019 which required a prolonged recovery time. Since then her main health problem has been bilateral hip pain. 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 She has been doing well with physical therapy but apparently will require L hip replacement eventually.    She has a history of colon cancer that was diagnosed in her 3s. She reports having a partial colectomy and undergoing chemotherapy and radiation. She later developed breast cancer in her late 73s which was also treated with resection and chemotherapy. She does not recall the specific  chemotherapy regimens or dates treatment. She also has a history of thyroid cancer and s/p resection in 2001.   ??  Her social history is notable for being retired and no history of tobacco, alcohol, or drug use. She lives in [place], [place].     Family history is remarkable for cancer in her father (lung and prostate) and sister (breast), and Alzheimer's in her mother    Otherwise, she denies new constitutional symptoms such as anorexia, weight loss, night sweats or unexplained fevers.  Furthermore, she denies symptoms of marrow failure: unexplained bleeding or bruising, recurrent or unexplained intercurrent infections, dyspnea on exertion, lightheadedness, palpitations or chest pain.  There have been no new or unexplained pains or self-identified masses, swelling or enlarged lymph nodes.    Interval history:  Reports severe diarrhea, even after taking a bite of food.  This is intermittent.  This is more than she usually goes.  She is eating, but not as much as before.  No bleeding.  Currently NOT taking posa.    Otherwise, denies new bone pain, fevers, chills, night sweats, lumps/bumps, tongue swelling, shortness of breath, syncope, lightheadedness, constipation or diarrhea, nausea or vomiting, very easy bruising or bleeding, or urinary changes.       Oncology History is as below:   Oncology History Overview Note   Referring/Local Oncologist: Dr. Orlie Dakin, [facility]    Diagnosis:   Final Diagnosis   Date Value Ref Range Status   04/08/2020   Final    (Outside case: [phone], dated 04/08/2020)  Bone marrow, aspiration and biopsy  -  Normocellular bone marrow (30%) involved by therapy-related acute myeloid leukemia (26% blasts by manual aspirate differential) (See Comment)  -   Monotypic B-cell population with partial CD5 expression identified by outside flow cytometric analysis (See Comment)    This electronic signature is attestation that the pathologist personally reviewed the submitted material(s) and the final diagnosis reflects that evaluation.            Genetics:   Karyotype/FISH: 46,XX,t(1;12)(p36.3;q13)[9]/46,XX[11]     Molecular Genetics: NPM1, IDH1, CEBPA (single allele)          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CMS-HCC)   04/12/2020 Initial Diagnosis    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CMS-HCC)     05/03/2020 -  Chemotherapy    IP/OP AML AZACITIDINE + VENETOCLAX  azacitidine 75 mg/m2 SQ on days 1-7, venetoclax ramp up week 1; dose dependent, then azacitidine 75 mg/m2 SQ on days 1-7 every 28 days.         Review of Systems:   ROS reviewed and negative except as noted in H and P     Allergies:  Allergies   Allergen Reactions   ??? Demeclocycline Other (See Comments)     Throat swells  Throat swells     ??? Sulfa (Sulfonamide Antibiotics) Other (See Comments)     Throat swells   ??? Tetracycline Swelling     Throat swelling   ??? Amoxicillin-Pot Clavulanate Diarrhea   ??? Ciprofloxacin Diarrhea   ??? Codeine Dizziness and Other (See Comments)     dizziness     ??? Procaine Other (See Comments)     Shaky   ??? Epinephrine Palpitations     Speeds heart up - panic      ??? Metronidazole Nausea And Vomiting   ??? Prednisone Anxiety     Nervous   Nervous      ??? Tramadol Nausea Only and Other (See Comments)     Sick  feeling  Sick feeling         Medications:     Current Outpatient Medications:   ???  azelastine-fluticasone (DYMISTA) 137-50 mcg/spray nasal spray, 2 sprays each nostril q day, Disp: , Rfl:   ???  clonazePAM (KLONOPIN) 0.5 MG tablet, Take 0.75 mg by mouth two (2) times a day. , Disp: , Rfl:   ???  escitalopram oxalate (LEXAPRO) 20 MG tablet, Take 20 mg by mouth every morning., Disp: , Rfl:   ???  levoFLOXacin (LEVAQUIN) 500 MG tablet, Take 1 tablet (500 mg total) by mouth daily., Disp: 30 tablet, Rfl: 0  ???  levothyroxine (SYNTHROID) 125 MCG tablet, Take 1 tablet by mouth daily., Disp: , Rfl:   ???  magnesium oxide (MAG-OX) 400 mg (241.3 mg elemental magnesium) tablet, Take 1 tablet by mouth daily., Disp: , Rfl:   ???  ondansetron (ZOFRAN) 8 MG tablet, Take 1 tablet (8 mg total) by mouth every eight (8) hours as needed for nausea., Disp: 30 tablet, Rfl: 2  ???  pantoprazole (PROTONIX) 40 MG tablet, Take 1 tablet by mouth daily., Disp: , Rfl:   ???  potassium chloride (KLOR-CON) 10 MEQ CR tablet, Take 1 tablet by mouth daily., Disp: , Rfl:   ???  valACYclovir (VALTREX) 500 MG tablet, Take 1 tablet (500 mg total) by mouth daily., Disp: 90 tablet, Rfl: 0  ???  venetoclax (VENCLEXTA) 100 mg tablet, Take 4 tablets (400 mg total) by mouth daily. Take with a meal and water. Do not chew, crush, or break tablets., Disp: 120 tablet, Rfl: 2    Medical History:  As per HPI    Social History:  As per HPI    Family History:  As per HPI    Objective:   There were no vitals taken for this visit.    Physical Exam:  General: Resting, in no apparent distress  HEENT:  No scleral icterus or conjunctival injection. Oral mucosa without ulceration, erythema or exudate.   CV:  RRR.  S1, S2.  No murmurs, gallops or rubs.  Resp:  Breathing is unlabored, and patient is speaking full sentences with ease.  No stridor.  CTAB. No rales, ronchi or crackles.    GI:  No distention or pain on palpation.  Bowel sounds are present and normal in quality.  No palpable hepatomegaly or splenomegaly.  No palpable masses.  Skin:  No rashes, petechiae or purpura.  No areas of skin breakdown. Warm to touch, dry, smooth and even.  Musculoskeletal:  No grossly-evident joint effusions or deformities. No pain on palpation of the spinous processes of the cervical, thoracic or lumbar vertebral bodies.  Psychiatric:  Alert and oriented to person, place, time and situation.  Range of affect is appropriate.    Neurologic:  CN II-XII are normal and symmetric. Gait is normal.  Cerebellar tasks are completed with ease and are symmetric.  Extremities:  Appear well-perfused.  No clubbing, edema or cyanosis.      Test Results:  No results found for this or any previous visit (from the past 48 hour(s)).

## 2020-05-16 ENCOUNTER — Telehealth: Payer: Self-pay | Admitting: *Deleted

## 2020-05-16 NOTE — Unmapped (Signed)
Office Visit on 05/15/2020   Component Date Value Ref Range Status   ??? Potassium 05/15/2020 3.6  3.4 - 4.5 mmol/L Final   Lab on 05/15/2020   Component Date Value Ref Range Status   ??? Sodium 05/15/2020 139  135 - 145 mmol/L Final   ??? Potassium 05/15/2020    Final    Specimen Hemolyzed     ??? Chloride 05/15/2020 103  98 - 107 mmol/L Final   ??? Anion Gap 05/15/2020 8  5 - 14 mmol/L Final   ??? CO2 05/15/2020 28.0  20.0 - 31.0 mmol/L Final   ??? BUN 05/15/2020 14  9 - 23 mg/dL Final   ??? Creatinine 05/15/2020 0.92* 0.60 - 0.80 mg/dL Final   ??? BUN/Creatinine Ratio 05/15/2020 15   Final   ??? EGFR CKD-EPI Non-African American,* 05/15/2020 63  >=60 mL/min/1.17m2 Final   ??? EGFR CKD-EPI African American, Fem* 05/15/2020 73  >=60 mL/min/1.32m2 Final   ??? Glucose 05/15/2020 95  70 - 179 mg/dL Final   ??? Calcium 96/08/5407 9.4  8.7 - 10.4 mg/dL Final   ??? Albumin 81/19/1478 3.5  3.4 - 5.0 g/dL Final   ??? Total Protein 05/15/2020 7.0  5.7 - 8.2 g/dL Final   ??? Total Bilirubin 05/15/2020 0.7  0.3 - 1.2 mg/dL Final   ??? AST 29/56/2130    Final    Specimen Hemolyzed     ??? ALT 05/15/2020 50* 10 - 49 U/L Final   ??? Alkaline Phosphatase 05/15/2020 134* 46 - 116 U/L Final   ??? Magnesium 05/15/2020 1.8  1.6 - 2.6 mg/dL Final   ??? WBC 86/57/8469 1.2* 4.5 - 11.0 10*9/L Final   ??? RBC 05/15/2020 4.66  4.00 - 5.20 10*12/L Final   ??? HGB 05/15/2020 12.7  12.0 - 16.0 g/dL Final   ??? HCT 62/95/2841 39.0  36.0 - 46.0 % Final   ??? MCV 05/15/2020 83.6  80.0 - 100.0 fL Final   ??? MCH 05/15/2020 27.1  26.0 - 34.0 pg Final   ??? MCHC 05/15/2020 32.4  31.0 - 37.0 g/dL Final   ??? RDW 32/44/0102 17.0* 12.0 - 15.0 % Final   ??? MPV 05/15/2020 8.9  7.0 - 10.0 fL Final   ??? Platelet 05/15/2020 151  150 - 440 10*9/L Final   ??? Neutrophils % 05/15/2020 37.0  % Final   ??? Lymphocytes % 05/15/2020 46.4  % Final   ??? Monocytes % 05/15/2020 10.8  % Final   ??? Eosinophils % 05/15/2020 0.7  % Final   ??? Basophils % 05/15/2020 1.8  % Final   ??? Neutrophil Left Shift 05/15/2020 1+* Not Present Final   ??? Absolute Neutrophils 05/15/2020 0.5* 2.0 - 7.5 10*9/L Final   ??? Absolute Lymphocytes 05/15/2020 0.6* 1.5 - 5.0 10*9/L Final   ??? Absolute Monocytes 05/15/2020 0.1* 0.2 - 0.8 10*9/L Final   ??? Absolute Eosinophils 05/15/2020 0.0  0.0 - 0.4 10*9/L Final   ??? Absolute Basophils 05/15/2020 0.0  0.0 - 0.1 10*9/L Final   ??? Large Unstained Cells 05/15/2020 3  0 - 4 % Final   ??? Anisocytosis 05/15/2020 Slight* Not Present Final   ??? Hypochromasia 05/15/2020 Slight* Not Present Final

## 2020-05-16 NOTE — Unmapped (Signed)
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 She reports struggling the most with diarrhea since discharge. She feels like anything she eats goes right through her. She also has struggled to drink water as this hasn't sounded good to her. She otherwise denies fever, bleeding, or other concerning symptoms at this time. She does report that she has posaconazole at home but hasn't started.    On labs, Hgb 12.7, PLT 151, ANC 0.5. CMP notable for hemolyzed K (rechecked and was 3.6), hemolyzed AST but ALT improved significantly from LFT elevations while inpatient (occurrence prior to starting azole)    Oncologic History:  Oncology History Overview Note   Referring/Local Oncologist: Dr. 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         Weight and Vitals:  Wt Readings from Last 3 Encounters:   05/15/20 63 kg (139 lb)   05/05/20 66.6 kg (146 lb 13.2 oz)   04/18/20 65.5 kg (144 lb 6.4 oz)     Temp Readings from Last 3 Encounters:   05/15/20 36.8 ??C (98.2 ??F) (Oral)   05/15/20 36.8 ??C (98.2 ??F) (Oral)   05/12/20 36.8 ??C (98.2 ??F) (Oral)     BP Readings from Last 3 Encounters:   05/15/20 130/69   05/15/20 134/79   05/12/20 130/61     Pulse Readings from Last 3 Encounters:   05/15/20 95   05/15/20 103   05/12/20 79       Pertinent Labs:  Office Visit on 05/15/2020   Component Date Value Ref Range Status   ??? Potassium 05/15/2020 3.6  3.4 - 4.5 mmol/L Final   Lab on 05/15/2020   Component Date Value Ref Range Status   ??? Sodium 05/15/2020 139  135 - 145 mmol/L Final   ??? Potassium 05/15/2020    Final    Specimen Hemolyzed     ??? Chloride 05/15/2020 103  98 - 107 mmol/L Final   ??? Anion Gap 05/15/2020 8  5 - 14 mmol/L Final   ??? CO2 05/15/2020 28.0  20.0 - 31.0 mmol/L Final   ??? BUN 05/15/2020 14  9 - 23 mg/dL Final   ??? Creatinine 05/15/2020 0.92* 0.60 - 0.80 mg/dL Final   ??? BUN/Creatinine Ratio 05/15/2020 15   Final   ??? EGFR CKD-EPI Non-African American,* 05/15/2020 63  >=60 mL/min/1.24m2 Final   ??? EGFR CKD-EPI African American, Fem* 05/15/2020 73  >=60 mL/min/1.63m2 Final   ??? Glucose 05/15/2020 95  70 - 179 mg/dL Final   ??? Calcium 16/02/9603 9.4  8.7 - 10.4 mg/dL Final   ??? Albumin 54/01/8118 3.5  3.4 - 5.0 g/dL Final   ??? Total Protein 05/15/2020 7.0  5.7 - 8.2 g/dL Final   ??? Total Bilirubin 05/15/2020 0.7  0.3 - 1.2 mg/dL Final   ??? AST 14/78/2956    Final    Specimen Hemolyzed     ??? ALT 05/15/2020 50* 10 - 49 U/L Final   ??? Alkaline Phosphatase 05/15/2020 134* 46 - 116 U/L Final   ??? Magnesium 05/15/2020 1.8  1.6 - 2.6 mg/dL Final   ??? ABO Grouping 05/15/2020 A POS   Final   ??? Antibody Screen 05/15/2020 NEG   Final   ??? WBC 05/15/2020 1.2* 4.5 - 11.0 10*9/L Final   ??? RBC 05/15/2020 4.66  4.00 - 5.20 10*12/L Final   ??? HGB 05/15/2020 12.7  12.0 - 16.0 g/dL Final   ??? HCT 21/30/8657 39.0  36.0 - 46.0 % Final   ??? MCV 05/15/2020 83.6  80.0 - 100.0 fL Final   ??? MCH 05/15/2020 27.1  26.0 - 34.0 pg Final   ??? MCHC 05/15/2020 32.4  31.0 - 37.0 g/dL Final   ??? RDW 84/69/6295 17.0* 12.0 - 15.0 % Final   ??? MPV 05/15/2020 8.9  7.0 - 10.0 fL Final   ??? Platelet 05/15/2020 151  150 - 440 10*9/L Final   ??? Neutrophils % 05/15/2020 37.0  % Final   ??? Lymphocytes % 05/15/2020 46.4  % Final   ??? Monocytes % 05/15/2020 10.8  % Final   ??? Eosinophils % 05/15/2020 0.7  % Final   ??? Basophils % 05/15/2020 1.8  % Final   ??? Neutrophil Left Shift 05/15/2020 1+* Not Present Final   ??? Absolute Neutrophils 05/15/2020 0.5* 2.0 - 7.5 10*9/L Final   ??? Absolute Lymphocytes 05/15/2020 0.6* 1.5 - 5.0 10*9/L Final   ??? Absolute Monocytes 05/15/2020 0.1* 0.2 - 0.8 10*9/L Final   ??? Absolute Eosinophils 05/15/2020 0.0  0.0 - 0.4 10*9/L Final   ??? Absolute Basophils 05/15/2020 0.0  0.0 - 0.1 10*9/L Final   ??? Large Unstained Cells 05/15/2020 3  0 - 4 % Final   ??? Anisocytosis 05/15/2020 Slight* Not Present Final   ??? Hypochromasia 05/15/2020 Slight* Not Present Final       Allergies:   Allergies   Allergen Reactions   ??? Demeclocycline Other (See Comments)     Throat swells  Throat swells     ??? Sulfa (Sulfonamide Antibiotics) Other (See Comments)     Throat swells   ??? Tetracycline Swelling     Throat swelling   ??? Amoxicillin-Pot Clavulanate Diarrhea   ??? Ciprofloxacin Diarrhea   ??? Codeine Dizziness and Other (See Comments)     dizziness     ??? Procaine Other (See Comments)     Shaky   ??? Epinephrine Palpitations     Speeds heart up - panic      ???  Metronidazole Nausea And Vomiting   ??? Prednisone Anxiety     Nervous   Nervous      ??? Tramadol Nausea Only and Other (See Comments)     Sick feeling  Sick feeling         Drug Interactions: venetoclax requires dose reduction to 100 mg daily for interaction with posaconazole      Current Medications:  Current Outpatient Medications   Medication Sig Dispense Refill   ??? azelastine-fluticasone (DYMISTA) 137-50 mcg/spray nasal spray 2 sprays each nostril q day     ??? clonazePAM (KLONOPIN) 0.5 MG tablet Take 0.75 mg by mouth two (2) times a day.      ??? escitalopram oxalate (LEXAPRO) 20 MG tablet Take 20 mg by mouth every morning.     ??? levoFLOXacin (LEVAQUIN) 500 MG tablet Take 1 tablet (500 mg total) by mouth daily. 30 tablet 0   ??? levothyroxine (SYNTHROID) 125 MCG tablet Take 1 tablet by mouth daily.     ??? loperamide (IMODIUM) 2 mg capsule Take 2 mg by mouth four (4) times a day as needed.     ??? magnesium oxide (MAG-OX) 400 mg (241.3 mg elemental magnesium) tablet Take 1 tablet by mouth daily.     ??? pantoprazole (PROTONIX) 40 MG tablet Take 1 tablet by mouth daily.     ??? potassium chloride (KLOR-CON) 10 MEQ CR tablet Take 1 tablet by mouth daily.     ??? valACYclovir (VALTREX) 500 MG tablet Take 1 tablet (500 mg total) by mouth daily. 90 tablet 0   ??? venetoclax (VENCLEXTA) 100 mg tablet Take 1 tablet (100 mg total) by mouth daily. Take with a meal and water. Do not chew, crush, or break tablets. 30 tablet 0   ??? ondansetron (ZOFRAN) 8 MG tablet Take 1 tablet (8 mg total) by mouth every eight (8) hours as needed for nausea. 30 tablet 2   ??? posaconazole (NOXAFIL) 100 mg TbEC delayed released tablet Take 300 mg by mouth daily. 90 tablet 5     No current facility-administered medications for this visit.       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 Take with food daily  - Continue levofloxacin 500 mg daily (ANC 0.5, but been lower), and valtrex 500 mg daily  - Off allopurinol, this is ok. Continue to attempt oral hydration. Suggested crystal light or non-caffeinated tea as an alternative  - For diarrhea should take imodium per the labeling. Can also add metamucil to bulk up stool. However if diarrhea not improving, stop metamucil as this can contribute to more stools.   - Use zofran if sensation of nausea, may help improve PO intake and can also help control diarrhea as a bonus  - Continue potassium 10 mEq daily. Likely would need an increase due to diarrhea if < 3.5. Consider stopping magnesium if remains normal and diarrhea persists  - Cancelling this weekend lab draw for unlikely need for transfusion. RTC on 1/19 for labs, CPP, and APP visit. BMbx arranged on 1/24 and will have MD follow-up on 1/27 for discussion re: cycle 2    F/u:  Future Appointments   Date Time Provider Department Center   05/22/2020  9:30 AM ADULT ONC LAB  [facility]   05/22/2020 10:30 AM [name], AGNP [id number]   05/22/2020 11:00 AM [name], CPP [id number]   05/22/2020 12:00 PM ONCINF CHAIR 37 [id number]   05/25/2020 10:00 AM ONCINF CHAIR 11 [id number]   05/27/2020 10:30 AM ADULT ONC LAB [facility]   05/27/2020 11:30 AM [name], PA [id number]   05/27/2020 12:30 PM ONCINF CHAIR 25 [id number]   05/30/2020 10:30 AM ADULT ONC LAB [facility]   05/30/2020 11:30 AM [name], MD [id number]   05/30/2020 12:30 PM ONCINF CHAIR 12 [id number]       I spent 25 minutes with Stephanie Jimenez in direct patient care.      Manfred Arch, PharmD, BCOP, CPP  Pager: [phone]

## 2020-05-16 NOTE — Unmapped (Signed)
Patient is in the infusion center today for lab check. Results were reviewed with them in clinic with no interventions needed. They left the infusion center alert and ambulatory after their PIV was removed.

## 2020-05-16 NOTE — Telephone Encounter (Signed)
Patient called requesting f/u appointment, was discharged from [facility] after being admitted to initiate treatment for AML. I spoke with Dr. [name], he recommends patient keep all f/u as scheduled at [facility], f/u at cancer center is not necessary at this time. Will schedule f/u with Dr. [name] in coordination with [facility]. Left message for patient to return call with any questions.

## 2020-05-17 ENCOUNTER — Other Ambulatory Visit: Payer: Self-pay | Admitting: Internal Medicine

## 2020-05-17 DIAGNOSIS — E039 Hypothyroidism, unspecified: Secondary | ICD-10-CM

## 2020-05-21 MED ORDER — VALACYCLOVIR 500 MG TABLET
ORAL_TABLET | Freq: Every day | ORAL | 0 refills | 90 days | Status: CP
Start: 2020-05-21 — End: ?

## 2020-05-22 ENCOUNTER — Ambulatory Visit
Admit: 2020-05-22 | Discharge: 2020-05-22 | Payer: PRIVATE HEALTH INSURANCE | Attending: Adult Health | Primary: Adult Health

## 2020-05-22 ENCOUNTER — Ambulatory Visit: Admit: 2020-05-22 | Discharge: 2020-05-22 | Payer: PRIVATE HEALTH INSURANCE

## 2020-05-22 ENCOUNTER — Other Ambulatory Visit: Admit: 2020-05-22 | Discharge: 2020-05-22 | Payer: PRIVATE HEALTH INSURANCE

## 2020-05-22 ENCOUNTER — Ambulatory Visit
Admit: 2020-05-22 | Discharge: 2020-05-22 | Payer: PRIVATE HEALTH INSURANCE | Attending: Pharmacist | Primary: Pharmacist

## 2020-05-22 DIAGNOSIS — Z801 Family history of malignant neoplasm of trachea, bronchus and lung: Secondary | ICD-10-CM | POA: Diagnosis not present

## 2020-05-22 DIAGNOSIS — F419 Anxiety disorder, unspecified: Secondary | ICD-10-CM | POA: Diagnosis not present

## 2020-05-22 DIAGNOSIS — Z882 Allergy status to sulfonamides status: Secondary | ICD-10-CM | POA: Diagnosis not present

## 2020-05-22 DIAGNOSIS — Z8042 Family history of malignant neoplasm of prostate: Secondary | ICD-10-CM | POA: Diagnosis not present

## 2020-05-22 DIAGNOSIS — R7989 Other specified abnormal findings of blood chemistry: Secondary | ICD-10-CM | POA: Diagnosis not present

## 2020-05-22 DIAGNOSIS — K219 Gastro-esophageal reflux disease without esophagitis: Secondary | ICD-10-CM | POA: Diagnosis not present

## 2020-05-22 DIAGNOSIS — C9200 Acute myeloblastic leukemia, not having achieved remission: Secondary | ICD-10-CM | POA: Diagnosis not present

## 2020-05-22 DIAGNOSIS — C189 Malignant neoplasm of colon, unspecified: Secondary | ICD-10-CM | POA: Diagnosis not present

## 2020-05-22 DIAGNOSIS — Z9221 Personal history of antineoplastic chemotherapy: Secondary | ICD-10-CM | POA: Diagnosis not present

## 2020-05-22 DIAGNOSIS — Z885 Allergy status to narcotic agent status: Secondary | ICD-10-CM | POA: Diagnosis not present

## 2020-05-22 DIAGNOSIS — C50919 Malignant neoplasm of unspecified site of unspecified female breast: Secondary | ICD-10-CM | POA: Diagnosis not present

## 2020-05-22 DIAGNOSIS — R197 Diarrhea, unspecified: Secondary | ICD-10-CM | POA: Diagnosis not present

## 2020-05-22 DIAGNOSIS — Z803 Family history of malignant neoplasm of breast: Secondary | ICD-10-CM | POA: Diagnosis not present

## 2020-05-22 DIAGNOSIS — C73 Malignant neoplasm of thyroid gland: Secondary | ICD-10-CM | POA: Diagnosis not present

## 2020-05-22 DIAGNOSIS — E039 Hypothyroidism, unspecified: Secondary | ICD-10-CM | POA: Diagnosis not present

## 2020-05-22 LAB — COMPREHENSIVE METABOLIC PANEL
ALBUMIN: 3.6 g/dL (ref 3.4–5.0)
ALKALINE PHOSPHATASE: 150 U/L — ABNORMAL HIGH (ref 46–116)
ALT (SGPT): 27 U/L (ref 10–49)
ANION GAP: 8 mmol/L (ref 5–14)
AST (SGOT): 14 U/L (ref <=34)
BILIRUBIN TOTAL: 0.8 mg/dL (ref 0.3–1.2)
BLOOD UREA NITROGEN: 19 mg/dL (ref 9–23)
BUN / CREAT RATIO: 16
CALCIUM: 9.4 mg/dL (ref 8.7–10.4)
CHLORIDE: 104 mmol/L (ref 98–107)
CO2: 27 mmol/L (ref 20.0–31.0)
CREATININE: 1.16 mg/dL — ABNORMAL HIGH
EGFR CKD-EPI AA FEMALE: 55 mL/min/{1.73_m2} — ABNORMAL LOW (ref >=60)
EGFR CKD-EPI NON-AA FEMALE: 48 mL/min/{1.73_m2} — ABNORMAL LOW (ref >=60)
GLUCOSE RANDOM: 87 mg/dL (ref 70–179)
POTASSIUM: 3.8 mmol/L (ref 3.4–4.5)
PROTEIN TOTAL: 7.4 g/dL (ref 5.7–8.2)
SODIUM: 139 mmol/L (ref 135–145)

## 2020-05-22 LAB — CBC W/ AUTO DIFF
BASOPHILS ABSOLUTE COUNT: 0 10*9/L (ref 0.0–0.1)
BASOPHILS RELATIVE PERCENT: 0.2 %
EOSINOPHILS ABSOLUTE COUNT: 0 10*9/L (ref 0.0–0.4)
EOSINOPHILS RELATIVE PERCENT: 0.1 %
HEMATOCRIT: 38.8 % (ref 36.0–46.0)
HEMOGLOBIN: 13.4 g/dL (ref 12.0–16.0)
LARGE UNSTAINED CELLS: 2 % (ref 0–4)
LYMPHOCYTES ABSOLUTE COUNT: 0.4 10*9/L — ABNORMAL LOW (ref 1.5–5.0)
LYMPHOCYTES RELATIVE PERCENT: 31.8 %
MEAN CORPUSCULAR HEMOGLOBIN CONC: 34.6 g/dL (ref 31.0–37.0)
MEAN CORPUSCULAR HEMOGLOBIN: 29.1 pg (ref 26.0–34.0)
MEAN CORPUSCULAR VOLUME: 84.2 fL (ref 80.0–100.0)
MEAN PLATELET VOLUME: 8.9 fL (ref 7.0–10.0)
MONOCYTES ABSOLUTE COUNT: 0 10*9/L — ABNORMAL LOW (ref 0.2–0.8)
MONOCYTES RELATIVE PERCENT: 1.4 %
NEUTROPHILS ABSOLUTE COUNT: 0.8 10*9/L — ABNORMAL LOW (ref 2.0–7.5)
NEUTROPHILS RELATIVE PERCENT: 64.6 %
PLATELET COUNT: 206 10*9/L (ref 150–440)
RED BLOOD CELL COUNT: 4.6 10*12/L (ref 4.00–5.20)
RED CELL DISTRIBUTION WIDTH: 17.1 % — ABNORMAL HIGH (ref 12.0–15.0)
WBC ADJUSTED: 1.2 10*9/L — ABNORMAL LOW (ref 4.5–11.0)

## 2020-05-22 LAB — MAGNESIUM: MAGNESIUM: 1.9 mg/dL (ref 1.6–2.6)

## 2020-05-22 LAB — LACTATE DEHYDROGENASE: LACTATE DEHYDROGENASE: 214 U/L (ref 120–246)

## 2020-05-22 MED ADMIN — sodium chloride 0.9% (NS) bolus 500 mL: 500 mL | INTRAVENOUS | @ 16:00:00 | Stop: 2020-05-22

## 2020-05-22 NOTE — Unmapped (Addendum)
Your ANC (neutrophil count) is 0.8, so you can stop your levaquin for now.  Keep this on hand for your next cycle when it drops again.    Plan to be here at 10:30am for labs on Monday for your bone marrow biopsy.  We'll do your biopsy around noon when I have a break in clinic.    Increase your water intake.  Try decaffeinated tea, gatorage, electrolyte water.    If you have any questions, please do not hesitate to contact us.    **Fevers over 100.4 are an EMERGENCY. Please go to the nearest ER**    If you experience new or worsening of the following symptoms, please call:  1. Night sweats  2. Fevers/Chills  3. Bleeding not stopped by holding pressure    When reviewing your results, please remember that the results of many of the tests we order can vary somewhat and that variation often means nothing.  Sometimes when we get results back after your clinic visit, if it looks like there???s some variation of that type, we may decide to recheck things sooner than we discussed in clinic.  If you get a call that we want to recheck things sooner, do not panic. It does not mean that things are going wrong.    For appointments & questions Monday through Friday 8 AM??? 5 PM   please call [phone] or Toll free [phone].    On Nights, Weekends and Holidays  Call [phone] and ask for the adult hematologist/oncologist on call.    Markus Jarvis, AGPCNP-C, MSN, OCN  Nurse Practitioner  Hematologic Malignancies  [facility]    Nurse Navigator: Wynona Meals, RN  Questions and appointments M-F 8am - 5pm: [phone] or [phone]    [facility]  [address]  [place], [place] [address]  [email or web address]    Results for orders placed or performed in visit on 05/22/20   Comprehensive Metabolic Panel   Result Value Ref Range    Sodium 139 135 - 145 mmol/L    Potassium 3.8 3.4 - 4.5 mmol/L    Chloride 104 98 - 107 mmol/L    Anion Gap 8 5 - 14 mmol/L    CO2 27.0 20.0 - 31.0 mmol/L    BUN 19 9 - 23 mg/dL Creatinine 3.66 (H) 4.40 - 0.80 mg/dL    BUN/Creatinine Ratio 16     EGFR CKD-EPI Non-African American, Female 48 (L) >=60 mL/min/1.76m2    EGFR CKD-EPI African American, Female 55 (L) >=60 mL/min/1.15m2    Glucose 87 70 - 179 mg/dL    Calcium 9.4 8.7 - 34.7 mg/dL    Albumin 3.6 3.4 - 5.0 g/dL    Total Protein 7.4 5.7 - 8.2 g/dL    Total Bilirubin 0.8 0.3 - 1.2 mg/dL    AST 14 <=42 U/L    ALT 27 10 - 49 U/L    Alkaline Phosphatase 150 (H) 46 - 116 U/L   Lactate dehydrogenase   Result Value Ref Range    LDH 214 120 - 246 U/L   Magnesium Level   Result Value Ref Range    Magnesium 1.9 1.6 - 2.6 mg/dL   CBC w/ Differential   Result Value Ref Range    WBC 1.2 (L) 4.5 - 11.0 10*9/L    RBC 4.60 4.00 - 5.20 10*12/L    HGB 13.4 12.0 - 16.0 g/dL    HCT 59.5 63.8 - 75.6 %    MCV 84.2 80.0 - 100.0  fL    MCH 29.1 26.0 - 34.0 pg    MCHC 34.6 31.0 - 37.0 g/dL    RDW 16.1 (H) 09.6 - 15.0 %    MPV 8.9 7.0 - 10.0 fL    Platelet 206 150 - 440 10*9/L    Variable HGB Concentration Slight (A) Not Present    Neutrophils % 64.6 %    Lymphocytes % 31.8 %    Monocytes % 1.4 %    Eosinophils % 0.1 %    Basophils % 0.2 %    Absolute Neutrophils 0.8 (L) 2.0 - 7.5 10*9/L    Absolute Lymphocytes 0.4 (L) 1.5 - 5.0 10*9/L    Absolute Monocytes 0.0 (L) 0.2 - 0.8 10*9/L    Absolute Eosinophils 0.0 0.0 - 0.4 10*9/L    Absolute Basophils 0.0 0.0 - 0.1 10*9/L    Large Unstained Cells 2 0 - 4 %    Anisocytosis Slight (A) Not Present    Hypochromasia Slight (A) Not Present

## 2020-05-22 NOTE — Unmapped (Signed)
StephanieStephanie Jimenez is a 71 y.o. 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 She reports that taking imodium prn has controlled diarrhea. She may even have a solid stool or feel constipated after taking it, needing it about every 2 days. She reports diarrhea is actually a longstanding problem, and is consistent with diarrhea she was having before chemo. Has not taken metamucil. She has been drinking significant amounts of caffeine in the form of tea, and she attributes loose stools to this as well. She was able to verbalize how she is taking the posa and venetoclax. Does have intermittent nausea, sometimes takes zofran and other times she just lays down until it's resolved. Denies fever, bleeding, SOB, congestion/sore throat. Continues to have ear plugged.     On labs, Hgb 13.4, PLT 206, ANC 0.8. CMP notable for SCr 1.16 (bumped from baseline ~0.8-0.9)    Oncologic History:  Oncology History Overview Note   Referring/Local Oncologist: Dr. 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            Genetics:   Karyotype/FISH: 46,XX,t(1;12)(p36.3;q13)[9]/46,XX[11]     Molecular Genetics: NPM1, IDH1, CEBPA (single allele)          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CMS-HCC)   04/12/2020 Initial Diagnosis    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CMS-HCC)     05/03/2020 -  Chemotherapy    IP/OP AML AZACITIDINE + VENETOCLAX  azacitidine 75 mg/m2 SQ on days 1-7, venetoclax ramp up week 1; dose dependent, then azacitidine 75 mg/m2 SQ on days 1-7 every 28 days.         Weight and Vitals:  Wt Readings from Last 3 Encounters:   05/15/20 63 kg (139 lb)   05/05/20 66.6 kg (146 lb 13.2 oz)   04/18/20 65.5 kg (144 lb 6.4 oz)     Temp Readings from Last 3 Encounters:   05/15/20 36.8 ??C (98.2 ??F) (Oral)   05/15/20 36.8 ??C (98.2 ??F) (Oral)   05/12/20 36.8 ??C (98.2 ??F) (Oral)     BP Readings from Last 3 Encounters:   05/15/20 130/69   05/15/20 134/79   05/12/20 130/61     Pulse Readings from Last 3 Encounters:   05/15/20 95   05/15/20 103   05/12/20 79       Pertinent Labs:  No visits with results within 1 Day(s) from this visit.   Latest known visit with results is:   Office Visit on 05/15/2020   Component Date Value Ref Range Status   ??? Potassium 05/15/2020 3.6  3.4 - 4.5 mmol/L Final       Allergies:  Allergies   Allergen Reactions   ??? Demeclocycline Other (See Comments)     Throat swells  Throat swells     ??? Sulfa (Sulfonamide Antibiotics) Other (See Comments)     Throat swells   ??? Tetracycline Swelling     Throat swelling   ??? Amoxicillin-Pot Clavulanate Diarrhea   ??? Ciprofloxacin Diarrhea   ??? Codeine Dizziness and Other (See Comments)     dizziness     ??? Procaine Other (See Comments)     Shaky   ??? Epinephrine Palpitations     Speeds heart up - panic      ??? Metronidazole Nausea And Vomiting   ??? Prednisone Anxiety     Nervous   Nervous      ??? Tramadol Nausea Only and Other (See Comments)     Sick feeling  Sick feeling         Drug Interactions: venetoclax requires dose reduction to 100 mg daily for interaction with posaconazole      Current Medications:  Current Outpatient Medications   Medication Sig Dispense Refill   ??? azelastine-fluticasone (DYMISTA) 137-50 mcg/spray nasal spray 2 sprays each nostril q day     ??? clonazePAM (KLONOPIN) 0.5 MG tablet Take 0.75 mg by mouth two (2) times a day.      ??? escitalopram oxalate (LEXAPRO) 20 MG tablet Take 20 mg by mouth every morning.     ??? levoFLOXacin (LEVAQUIN) 500 MG tablet Take 1 tablet (500 mg total) by mouth daily. 30 tablet 0   ??? levothyroxine (SYNTHROID) 125 MCG tablet Take 1 tablet by mouth daily.     ??? loperamide (IMODIUM) 2 mg capsule Take 2 mg by mouth four (4) times a day as needed.     ??? magnesium oxide (MAG-OX) 400 mg (241.3 mg elemental magnesium) tablet Take 1 tablet by mouth daily.     ??? ondansetron (ZOFRAN) 8 MG tablet Take 1 tablet (8 mg total) by mouth every eight (8) hours as needed for nausea. 30 tablet 2   ??? pantoprazole (PROTONIX) 40 MG tablet Take 1 tablet by mouth daily.     ??? posaconazole (NOXAFIL) 100 mg TbEC delayed released tablet Take 300 mg by mouth daily. 90 tablet 5   ??? potassium chloride (KLOR-CON) 10 MEQ CR tablet Take 1 tablet by mouth daily.     ??? valACYclovir (VALTREX) 500 MG tablet TAKE 1 TABLET (500 MG TOTAL) BY MOUTH DAILY. 90 tablet 0   ??? venetoclax (VENCLEXTA) 100 mg tablet Take 1 tablet (100 mg total) by mouth daily. Take with a meal and water. Do not chew, crush, or break tablets. 30 tablet 0     No current facility-administered medications for this visit.       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 Take with food daily  - STOP levofloxacin 500 mg daily since ANC 0.8. Continue valtrex 500 mg daily. Continue posaconazole 300 mg daily with ven dose reduction as above; can discontinue this prior to C2 if ANC remains > 0.5. Didn't change today since ven dosing implications.  - Off allopurinol, this is ok.   - Will get 0.5 L NS today in clinic. Continue to attempt oral hydration. Suggested crystal light or non-caffeinated tea as an  alternative.   - For diarrhea, can continue imodium prn. Suggest against scheduled daily as it sounds like she becomes constipated with use.  - Discussed our recommendation at length to get the Covid vaccine series as soon as possible (can get Moderna or Pfizer x 3 monthly vaccines followed by a booster at 6 months). Patient will think about it.  - RTC on 1/24 for BMbx and MD follow-up on 1/27. Patient requests to get subsequent azacitidine infusions in [place]. I explained this is likely, but we will manage venetoclax so would have to come here at least monthly.     F/u:  Future Appointments   Date Time Provider Department Center   05/27/2020 10:30 AM ADULT ONC LAB [facility]   05/27/2020 12:00 PM [name], AGNP [id number]   05/27/2020 12:30 PM ONCINF CHAIR 25 [id number]   05/30/2020 10:30 AM ADULT ONC LAB [facility]   05/30/2020 11:30 AM [name], MD [id number]   05/30/2020 12:30 PM ONCINF CHAIR 12 [id number]       I spent 25 minutes with Stephanie Jimenez in direct patient care.      Manfred Arch, PharmD, BCOP, CPP  Pager: [phone]

## 2020-05-22 NOTE — Unmapped (Signed)
[facility] Leukemia Clinic Follow-up Visit Note     Patient Name: Stephanie Jimenez  Patient Age: 70 y.o.  Encounter Date: 05/22/2020    Primary Care Provider:  Charm Barges, MD    Referring Physician:  [name], MD  [address]  [place],  [place] [address]    Reason for visit:  Establish AML    Current therapy:   None    Assessment/Plan:  Stephanie Jimenez is a 70 y.o. female with past medical history of anxiety, breast cancer, colon cancer, hypothyroidism, and GERD who presents to establish care for AML. Diagnosed on bone marrow biopsy on 04/08/2020 while undergoing evaluation for persistent leukopenia. She has a remote history (~10-15 years ago) of systemic chemotherapy for breast and colon cancer which raises suspicion of therapy-related AML. However, cytogenetics were not specific or supportive of this diagnosis. At this time mutational profile is still pending.    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 2010, Blood, PMID: 60454098) . For older patients (age > 60) unable to undergo intensive induction chemotherapy, the use of venetoclax in combination with an HMA (decitabine or azacitidine) resulted in an CR+CRi of 73%, with a median duration of 11.3 months, and a median overall survival of 17.5 months (DiNardo et al. 2019, Blood, PMID: [id number]). Both, inpatient (high intensity induction chemotherapy) and outpatient (AZA/Ven) treatment options were presented and discussed with the patient. Through shared decision making we decided to go forward with AZA/Ven.    Additionally, after our initial visit with the patient, she was found to have an NPM1 mutation, along with an IDH1 mutation and CEBPA mutation.  These mutations together confer favorable response to AZA/VEN (ignoring the potential for treatment related AML).  Older (>65 years) patients with NPM1 mutated AML had a 88% CR rate with AZA/VEN, and a mOS not reached (Lachowiez, Blood Advances 2020).  IDH1 mutations are also known to confer increased response rates to venetoclax.      Stephanie Jimenez is doing fairly well. She reports fatigue and improved diarrhea. She tolerated ramp up with Aza/Ven fairly well, aside from mild LFT elevations, which have improved from 01/12. Tolerating posa with no concerns at this time. Reports 100% comliance on her venetoclax.  Hemoglobin and platelets stable. No transfusions necessary today - will cancel her Saturday appointment.  Pt reports drinking one 16 ounce bottle of water daily. Diarrhea managed by imodium.  Bone marrow biopsy scheduled for Monday 01/24.  Pt will see Dr. Senaida Ores on 01/27.       Therapy-related AML: NPM1, IDH1, CEBPA (single allele) mutated.  By ELN criteria (again ignoring the history of chemotherapy), this would be favorable risk disease with a high likelihood of responding to chemotherapy.  It is unclear how her prior history of chemotherapy will contribute to her disease response.    - AZA/VEN - C1D1 12/31 today C1D20  - cont posaconazole, ven  - stop Levaquin today 01/19  - Bmbx 1/24 (scheduled)  - Follow up with Dr. Senaida Ores on 1/27    Diarrhea: unclear if r/t therapy, as was pre-existing, but maybe worsened by venetoclax  - controlled with imodium  - encouraged to  take daily for prophylaxis if PRN use is not adequate  - no c/f c diff at this point    Elevated creatinine: baseline creatinine around 0.97. 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 Unclear significance in the absence of lymphadenopathy, splenomegaly, or other signs of a lymphoid neoplasm.  - CTM    Hypothyroidism: Secondary to treatment of thyroid cancer.  - Consider repeat TSH in 4-weeks which was elevated on most recent testing  - Continue levothyroxine    Psychosocial distress: She reports a high level of anxiety regarding the management of the above.   - Counseling given   - Consider ref to comprehensive cancer support program     Patient-centered care/Shared decision-making:   We discussed the plan above at length. The patient and her friend actively contributed to the conversation. Specifically, her most important outcomes are:   1) Prolonging OS  2) Avoiding Long-term side effects   3) Maintaining her overall functional activity     Supportive Care Needs: We recommend based on the patient???s underlying diagnosis and treatment history the following supportive care:    1. Antimicrobial prophylaxis:  None    2. Blood product support:  Leukoreduced blood products are required.  Irradiated blood products are preferred, but in case of urgent transfusion needs non-irradiated blood products may be used: 2 units for Hg <= 7.0.     Coordination of care:   Plan for weekly labs  BMBx 1/24  Dr. Senaida Ores 1/27    Stephanie Jarvis, RN, MSN, AGPCNP-C  Nurse Practitioner  Hematologic Malignancies  [facility]  [phone] (phone)  [phone] (fax)  [email or web address]      I personally spent 45 minutes face-to-face and non-face-to-face in the care of this patient, which includes all pre, intra, and post visit time on the date of service.      Mariel Aloe, MD was available  Leukemia Program  Division of Hematology  [facility]      Nurse Navigator (non-clinical trial patients): Wynona Meals, RN        Tel. [phone]       Fax. [phone]  Toll-free appointments: [phone]  Scheduling assistance: [phone]  After hours/weekends: [phone] (ask for adult hematology/oncology on-call)          History of Present Illness:   Stephanie Jimenez is a 70 y.o. female with past medical history noted as above who presents to establish care for AML.    She presented for evaluation of incidentally noted leukopenia in November 2021. Labs obtained around that time showed a normal iron panel, folate, vitamin B12, an elevated TSH, and flow cytometry with 6% myeloblast. She underwent a bone marrow biopsy on December 6th which showed a normocellular bone marrow (30%) involved by therapy-related acute myeloid leukemia (26% blasts by manual aspirate differential) and with a monotypic B-cell population. Review of the EMR shows that her leukopenia dates back to at least June 2020. She reports having a viral illness at the end of 2019 which required a prolonged recovery time. Since then her main health problem has been bilateral hip pain. 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 She has been doing  well with physical therapy but apparently will require L hip replacement eventually.    She has a history of colon cancer that was diagnosed in her 69s. She reports having a partial colectomy and undergoing chemotherapy and radiation. She later developed breast cancer in her late 93s which was also treated with resection and chemotherapy. She does not recall the specific chemotherapy regimens or dates treatment. She also has a history of thyroid cancer and s/p resection in 2001.   ??  Her social history is notable for being retired and no history of tobacco, alcohol, or drug use. She lives in [place], [place].     Family history is remarkable for cancer in her father (lung and prostate) and sister (breast), and Alzheimer's in her mother    Otherwise, she denies new constitutional symptoms such as anorexia, weight loss, night sweats or unexplained fevers.  Furthermore, she denies symptoms of marrow failure: unexplained bleeding or bruising, recurrent or unexplained intercurrent infections, dyspnea on exertion, lightheadedness, palpitations or chest pain.  There have been no new or unexplained pains or self-identified masses, swelling or enlarged lymph nodes.    Interval history:  Reports improvement with diarrhea when taking imodium. She is eating, but usually only breakfast and dinner. Reports poor PO fluid intake. Reports intermittent nausea relieved by rest. Denies needing to use Zofran. No bleeding or new bruising. Currently taking posa.  No missed doses of venetoclax.    Otherwise, denies new bone pain, fevers, chills, night sweats, lumps/bumps, tongue swelling, shortness of breath, syncope, lightheadedness, constipation, nausea or vomiting, very easy bruising or bleeding, or urinary changes.       Oncology History is as below:   Oncology History Overview Note   Referring/Local Oncologist: Dr. Orlie Dakin, [facility]    Diagnosis:   Final Diagnosis   Date Value Ref Range Status   04/08/2020   Final    (Outside case: [phone], dated 04/08/2020)  Bone marrow, aspiration and biopsy  -  Normocellular bone marrow (30%) involved by therapy-related acute myeloid leukemia (26% blasts by manual aspirate differential) (See Comment)  -   Monotypic B-cell population with partial CD5 expression identified by outside flow cytometric analysis (See Comment)    This electronic signature is attestation that the pathologist personally reviewed the submitted material(s) and the final diagnosis reflects that evaluation.            Genetics:   Karyotype/FISH: 46,XX,t(1;12)(p36.3;q13)[9]/46,XX[11]     Molecular Genetics: NPM1, IDH1, CEBPA (single allele)          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CMS-HCC)   04/12/2020 Initial Diagnosis    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CMS-HCC)     05/03/2020 -  Chemotherapy    IP/OP AML AZACITIDINE + VENETOCLAX  azacitidine 75 mg/m2 SQ on days 1-7, venetoclax ramp up week 1; dose dependent, then azacitidine 75 mg/m2 SQ on days 1-7 every 28 days.         Review of Systems:   ROS reviewed and negative except as noted in H and P     Allergies:  Allergies   Allergen Reactions   ??? Demeclocycline Other (See Comments)     Throat swells  Throat swells     ??? Sulfa (Sulfonamide Antibiotics) Other (See Comments)     Throat swells   ??? Tetracycline Swelling     Throat swelling   ??? Amoxicillin-Pot Clavulanate Diarrhea   ??? Ciprofloxacin Diarrhea   ??? Codeine Dizziness and Other (See Comments)     dizziness     ???  Procaine Other (See Comments)     Shaky   ??? Epinephrine Palpitations     Speeds heart up - panic      ??? Metronidazole Nausea And Vomiting   ??? Prednisone Anxiety     Nervous   Nervous      ??? Tramadol Nausea Only and Other (See Comments)     Sick feeling  Sick feeling         Medications:     Current Outpatient Medications:   ???  azelastine-fluticasone (DYMISTA) 137-50 mcg/spray nasal spray, 2 sprays each nostril q day, Disp: , Rfl:   ???  clonazePAM (KLONOPIN) 0.5 MG tablet, Take 0.75 mg by mouth two (2) times a day. , Disp: , Rfl:   ???  escitalopram oxalate (LEXAPRO) 20 MG tablet, Take 20 mg by mouth every morning., Disp: , Rfl:   ???  levoFLOXacin (LEVAQUIN) 500 MG tablet, Take 1 tablet (500 mg total) by mouth daily., Disp: 30 tablet, Rfl: 0  ???  levothyroxine (SYNTHROID) 125 MCG tablet, Take 1 tablet by mouth daily., Disp: , Rfl:   ???  loperamide (IMODIUM) 2 mg capsule, Take 2 mg by mouth four (4) times a day as needed., Disp: , Rfl:   ???  magnesium oxide (MAG-OX) 400 mg (241.3 mg elemental magnesium) tablet, Take 1 tablet by mouth daily., Disp: , Rfl:   ???  ondansetron (ZOFRAN) 8 MG tablet, Take 1 tablet (8 mg total) by mouth every eight (8) hours as needed for nausea., Disp: 30 tablet, Rfl: 2  ???  pantoprazole (PROTONIX) 40 MG tablet, Take 1 tablet by mouth daily., Disp: , Rfl:   ???  posaconazole (NOXAFIL) 100 mg TbEC delayed released tablet, Take 300 mg by mouth daily., Disp: 90 tablet, Rfl: 5  ???  potassium chloride (KLOR-CON) 10 MEQ CR tablet, Take 1 tablet by mouth daily., Disp: , Rfl:   ???  valACYclovir (VALTREX) 500 MG tablet, TAKE 1 TABLET (500 MG TOTAL) BY MOUTH DAILY., Disp: 90 tablet, Rfl: 0  ???  venetoclax (VENCLEXTA) 100 mg tablet, Take 1 tablet (100 mg total) by mouth daily. Take with a meal and water. Do not chew, crush, or break tablets., Disp: 30 tablet, Rfl: 0    Medical History:  As per HPI    Social History:  As per HPI    Family History:  As per HPI    Objective:   There were no vitals taken for this visit.    Physical Exam:  General: Resting, in no apparent distress  HEENT:  No scleral icterus or conjunctival injection. Oral mucosa without ulceration, erythema or exudate.   CV:  RRR.  S1, S2.  No murmurs, gallops or rubs.  Resp:  Breathing is unlabored, and patient is speaking full sentences with ease.  No stridor.  CTAB. No rales, ronchi or crackles.    GI:  No distention or pain on palpation.  Bowel sounds are present and normal in quality.  No palpable hepatomegaly or splenomegaly.  No palpable masses.  Skin:  No rashes, petechiae or purpura.  No areas of skin breakdown. Warm to touch, dry, smooth and even.  Musculoskeletal:  No grossly-evident joint effusions or deformities. No pain on palpation of the spinous processes of the cervical, thoracic or lumbar vertebral bodies.  Psychiatric:  Alert and oriented to person, place, time and situation.  Range of affect is appropriate.    Neurologic:  CN II-XII are normal and symmetric. Gait is normal.  Cerebellar tasks are completed  with ease and are symmetric.  Extremities:  Appear well-perfused.  No clubbing, edema or cyanosis.      Test Results:  No results found for this or any previous visit (from the past 48 hour(s)).

## 2020-05-22 NOTE — Unmapped (Signed)
Patient c/o dizziness and feeling funny behind her eyes. Orthostatics checked, but not orthostatic. Sit: 119/60, 80. Stand: 117/64, 83.

## 2020-05-22 NOTE — Unmapped (Signed)
11:10am Normal saline bolus started through left arm 22G peripheral SL over 30 minutes per order.

## 2020-05-22 NOTE — Unmapped (Unsigned)
PIV placed.  Labs drawn & sent for analysis. To next appt.  Care provided by Felton Clinton RN.

## 2020-05-23 ENCOUNTER — Other Ambulatory Visit: Payer: Self-pay | Admitting: Internal Medicine

## 2020-05-24 ENCOUNTER — Other Ambulatory Visit: Payer: Self-pay | Admitting: Internal Medicine

## 2020-05-25 DIAGNOSIS — C9200 Acute myeloblastic leukemia, not having achieved remission: Principal | ICD-10-CM

## 2020-05-27 ENCOUNTER — Ambulatory Visit: Admit: 2020-05-27 | Discharge: 2020-05-28 | Payer: PRIVATE HEALTH INSURANCE

## 2020-05-27 ENCOUNTER — Other Ambulatory Visit: Admit: 2020-05-27 | Discharge: 2020-05-28 | Payer: PRIVATE HEALTH INSURANCE

## 2020-05-27 ENCOUNTER — Ambulatory Visit
Admit: 2020-05-27 | Discharge: 2020-05-28 | Payer: PRIVATE HEALTH INSURANCE | Attending: Adult Health | Primary: Adult Health

## 2020-05-27 DIAGNOSIS — E039 Hypothyroidism, unspecified: Secondary | ICD-10-CM | POA: Diagnosis not present

## 2020-05-27 DIAGNOSIS — M810 Age-related osteoporosis without current pathological fracture: Secondary | ICD-10-CM | POA: Diagnosis not present

## 2020-05-27 DIAGNOSIS — E559 Vitamin D deficiency, unspecified: Secondary | ICD-10-CM | POA: Diagnosis not present

## 2020-05-27 DIAGNOSIS — K580 Irritable bowel syndrome with diarrhea: Secondary | ICD-10-CM | POA: Diagnosis not present

## 2020-05-27 DIAGNOSIS — C9200 Acute myeloblastic leukemia, not having achieved remission: Secondary | ICD-10-CM | POA: Diagnosis not present

## 2020-05-27 DIAGNOSIS — Z96641 Presence of right artificial hip joint: Secondary | ICD-10-CM | POA: Diagnosis not present

## 2020-05-27 DIAGNOSIS — E876 Hypokalemia: Principal | ICD-10-CM

## 2020-05-27 LAB — COMPREHENSIVE METABOLIC PANEL
ALBUMIN: 3.7 g/dL (ref 3.4–5.0)
ALKALINE PHOSPHATASE: 155 U/L — ABNORMAL HIGH (ref 46–116)
ALT (SGPT): 26 U/L (ref 10–49)
ANION GAP: 10 mmol/L (ref 5–14)
AST (SGOT): 15 U/L (ref <=34)
BILIRUBIN TOTAL: 0.9 mg/dL (ref 0.3–1.2)
BLOOD UREA NITROGEN: 13 mg/dL (ref 9–23)
BUN / CREAT RATIO: 11
CALCIUM: 9.1 mg/dL (ref 8.7–10.4)
CHLORIDE: 103 mmol/L (ref 98–107)
CO2: 26 mmol/L (ref 20.0–31.0)
CREATININE: 1.14 mg/dL — ABNORMAL HIGH
EGFR CKD-EPI AA FEMALE: 56 mL/min/{1.73_m2} — ABNORMAL LOW (ref >=60)
EGFR CKD-EPI NON-AA FEMALE: 49 mL/min/{1.73_m2} — ABNORMAL LOW (ref >=60)
GLUCOSE RANDOM: 93 mg/dL (ref 70–179)
POTASSIUM: 2.9 mmol/L — ABNORMAL LOW (ref 3.4–4.5)
PROTEIN TOTAL: 7.3 g/dL (ref 5.7–8.2)
SODIUM: 139 mmol/L (ref 135–145)

## 2020-05-27 LAB — CBC W/ AUTO DIFF
BASOPHILS ABSOLUTE COUNT: 0 10*9/L (ref 0.0–0.1)
BASOPHILS RELATIVE PERCENT: 0.2 %
EOSINOPHILS ABSOLUTE COUNT: 0 10*9/L (ref 0.0–0.4)
EOSINOPHILS RELATIVE PERCENT: 0.3 %
HEMATOCRIT: 40.8 % (ref 36.0–46.0)
HEMOGLOBIN: 13.6 g/dL (ref 12.0–16.0)
LARGE UNSTAINED CELLS: 4 % (ref 0–4)
LYMPHOCYTES ABSOLUTE COUNT: 0.4 10*9/L — ABNORMAL LOW (ref 1.5–5.0)
LYMPHOCYTES RELATIVE PERCENT: 60.9 %
MEAN CORPUSCULAR HEMOGLOBIN CONC: 33.3 g/dL (ref 31.0–37.0)
MEAN CORPUSCULAR HEMOGLOBIN: 27.7 pg (ref 26.0–34.0)
MEAN CORPUSCULAR VOLUME: 83.1 fL (ref 80.0–100.0)
MEAN PLATELET VOLUME: 8.4 fL (ref 7.0–10.0)
MONOCYTES ABSOLUTE COUNT: 0 10*9/L — ABNORMAL LOW (ref 0.2–0.8)
MONOCYTES RELATIVE PERCENT: 1.7 %
NEUTROPHILS ABSOLUTE COUNT: 0.2 10*9/L — CL (ref 2.0–7.5)
NEUTROPHILS RELATIVE PERCENT: 33 %
PLATELET COUNT: 483 10*9/L — ABNORMAL HIGH (ref 150–440)
RED BLOOD CELL COUNT: 4.91 10*12/L (ref 4.00–5.20)
RED CELL DISTRIBUTION WIDTH: 16.8 % — ABNORMAL HIGH (ref 12.0–15.0)
WBC ADJUSTED: 0.6 10*9/L — ABNORMAL LOW (ref 4.5–11.0)

## 2020-05-27 LAB — LACTATE DEHYDROGENASE: LACTATE DEHYDROGENASE: 205 U/L (ref 120–246)

## 2020-05-27 LAB — MAGNESIUM: MAGNESIUM: 1.8 mg/dL (ref 1.6–2.6)

## 2020-05-27 MED ADMIN — sodium chloride 0.9% (NS) bolus 1,000 mL: 1000 mL | INTRAVENOUS | @ 18:00:00 | Stop: 2020-05-27

## 2020-05-27 MED ADMIN — potassium chloride (KLOR-CON) CR tablet 40 mEq: 40 meq | ORAL | @ 18:00:00 | Stop: 2020-05-27

## 2020-05-27 MED ADMIN — potassium chloride 10 mEq in 100 mL IVPB: 10 meq | INTRAVENOUS | @ 18:00:00 | Stop: 2020-05-27

## 2020-05-27 MED ADMIN — potassium chloride 10 mEq in 100 mL IVPB: 10 meq | INTRAVENOUS | @ 19:00:00 | Stop: 2020-05-27

## 2020-05-27 NOTE — Unmapped (Signed)
You may find you are sore for a couple of days following this procedure.    If you have discomfort, you may try a cold pack on for 15 min/off for 15 min. You may also take a dose of acetaminophen 650 mg orally if you need it.     Contact your physician if you have swelling, marked tenderness, increased pain, and/or further bleeding is observed. Also contact your physician if you develop a fever equal or greater than 100.5. You may reach the hematology/oncology fellow on call by dialing the hospital operator at [phone].    Avoid overexertion (eg, heavy activity or exercise) for at least 24 hours, to prevent potential pain or bleeding at the site of the procedure. The site should be kept dry and covered with the bandage during this time to minimize the chance of infection or bleeding.    Lab on 05/27/2020   Component Date Value Ref Range Status   ??? WBC 05/27/2020 0.6* 4.5 - 11.0 10*9/L Final   ??? RBC 05/27/2020 4.91  4.00 - 5.20 10*12/L Final   ??? HGB 05/27/2020 13.6  12.0 - 16.0 g/dL Final   ??? HCT 09/81/1914 40.8  36.0 - 46.0 % Final   ??? MCV 05/27/2020 83.1  80.0 - 100.0 fL Final   ??? MCH 05/27/2020 27.7  26.0 - 34.0 pg Final   ??? MCHC 05/27/2020 33.3  31.0 - 37.0 g/dL Final   ??? RDW 78/29/5621 16.8* 12.0 - 15.0 % Final   ??? MPV 05/27/2020 8.4  7.0 - 10.0 fL Final   ??? Platelet 05/27/2020 483* 150 - 440 10*9/L Final   ??? Variable HGB Concentration 05/27/2020 Slight* Not Present Final   ??? Neutrophils % 05/27/2020 33.0  % Final   ??? Lymphocytes % 05/27/2020 60.9  % Final   ??? Monocytes % 05/27/2020 1.7  % Final   ??? Eosinophils % 05/27/2020 0.3  % Final   ??? Basophils % 05/27/2020 0.2  % Final   ??? Neutrophil Left Shift 05/27/2020 1+* Not Present Final   ??? Absolute Neutrophils 05/27/2020 0.2* 2.0 - 7.5 10*9/L Final   ??? Absolute Lymphocytes 05/27/2020 0.4* 1.5 - 5.0 10*9/L Final   ??? Absolute Monocytes 05/27/2020 0.0* 0.2 - 0.8 10*9/L Final   ??? Absolute Eosinophils 05/27/2020 0.0  0.0 - 0.4 10*9/L Final   ??? Absolute Basophils 05/27/2020 0.0  0.0 - 0.1 10*9/L Final   ??? Large Unstained Cells 05/27/2020 4  0 - 4 % Final   ??? Microcytosis 05/27/2020 Slight* Not Present Final   ??? Anisocytosis 05/27/2020 Slight* Not Present Final   ??? Hypochromasia 05/27/2020 Slight* Not Present Final   ??? Sodium 05/27/2020 139  135 - 145 mmol/L Final   ??? Potassium 05/27/2020 2.9* 3.4 - 4.5 mmol/L Final   ??? Chloride 05/27/2020 103  98 - 107 mmol/L Final   ??? Anion Gap 05/27/2020 10  5 - 14 mmol/L Final   ??? CO2 05/27/2020 26.0  20.0 - 31.0 mmol/L Final   ??? BUN 05/27/2020 13  9 - 23 mg/dL Final   ??? Creatinine 05/27/2020 1.14* 0.60 - 0.80 mg/dL Final   ??? BUN/Creatinine Ratio 05/27/2020 11   Final   ??? EGFR CKD-EPI Non-African American,* 05/27/2020 49* >=60 mL/min/1.75m2 Final   ??? EGFR CKD-EPI African American, Fem* 05/27/2020 56* >=60 mL/min/1.73m2 Final   ??? Glucose 05/27/2020 93  70 - 179 mg/dL Final   ??? Calcium 30/86/5784 9.1  8.7 - 10.4 mg/dL Final   ??? Albumin 69/62/9528  3.7  3.4 - 5.0 g/dL Final   ??? Total Protein 05/27/2020 7.3  5.7 - 8.2 g/dL Final   ??? Total Bilirubin 05/27/2020 0.9  0.3 - 1.2 mg/dL Final   ??? AST 16/02/9603 15  <=34 U/L Final   ??? ALT 05/27/2020 26  10 - 49 U/L Final   ??? Alkaline Phosphatase 05/27/2020 155* 46 - 116 U/L Final   ??? LDH 05/27/2020 205  120 - 246 U/L Final   ??? Magnesium 05/27/2020 1.8  1.6 - 2.6 mg/dL Final   ??? ABO Grouping 05/27/2020 A POS   Final   ??? Antibody Screen 05/27/2020 NEG   Final

## 2020-05-27 NOTE — Unmapped (Signed)
Patient arrived to chair 15.  No complaints noted.  Access of PIV intact with blood return.  Pt tolerated infusion without difficulty. PIV checked for blood return, flushed, and removed. AVS printed. Patient left infusion center, NAD.

## 2020-05-27 NOTE — Unmapped (Signed)
Date of Service: 05/27/2020      Patient Active Problem List   Diagnosis   ??? 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CMS-HCC)   ??? Anxiety   ??? Fatty liver disease, nonalcoholic   ??? GERD (gastroesophageal reflux disease)   ??? Peripheral neuropathy   ??? History of malignant neoplasm of breast   ??? History of malignant neoplasm of colon   ??? History of malignant neoplasm of thyroid   ??? Hypothyroidism   ??? Irritable bowel syndrome with diarrhea   ??? Osteoporosis   ??? Status post total hip replacement, right   ??? Vitamin D deficiency       Indication:    Diagnosis ICD-10-CM Associated Orders   1. Acute myeloid leukemia not having achieved remission (CMS-HCC)  C92.00 Hematopathology Order     Hematopathology Order     Cytogenetics Cancer/FISH NON-BLOOD     Cytogenetics Cancer/FISH NON-BLOOD     AML MRD, Flow, Bone Marrow (Sendout)     AML MRD, Flow, Bone Marrow (Sendout)     DNA Extract and Hold     DNA Extract and Hold       Premedication: None    Ordering Provider: Mariel Aloe, MD    Clinician(s) Performing Procedure:  Andreas Ohm, FNP-BC      Bone Marrow Aspirate and Biopsy, right side    The procedure risks and alternatives of the procedure were explained to the patient.  The patient verbalized understanding and signed informed consent. After a time-out in which his patient identifiers were checked by 2 providers, the patient was laid in prone position on the table.   The  posterior superior iliac spine and iliac crest were cleaned, prepped and draped in the usual sterile fashion.     Anesthetic agent used: 2% plain lidocaine.      Utilizing a Ranfac needle, a bone marrow aspiration and biopsy was performed.  Specimens were sent for routine histopathologic stains and sectioning, flow cytometry and cytogenetics.     A pressure dressing was applied to the biopsy site.  Patient tolerated the procedure well.  Hemostasis was confirmed upon discharge.     The patient was given verbal instructions for wound care, such as to keep the biopsy site dry, covered for 24 hours, and to call your physician for a temperature > 100.5.  Tylenol may be taken for discomfort.    Specimens Collected:  EDTA x 2  Heparin x 1  Core biopsy x 1

## 2020-05-27 NOTE — Unmapped (Unsigned)
Patient port accessed and blood drawn per protocol. Pt discharged alert, oriented, and in stable condition

## 2020-05-27 NOTE — Unmapped (Signed)
Pt in unit for bone marrow biopsy per orders; pt resting in bed, call light in reach. NP at bedside, consent witnessed and time out completed.

## 2020-05-27 NOTE — Unmapped (Signed)
Lab on 05/27/2020   Component Date Value Ref Range Status   ??? WBC 05/27/2020 0.6* 4.5 - 11.0 10*9/L Final   ??? RBC 05/27/2020 4.91  4.00 - 5.20 10*12/L Final   ??? HGB 05/27/2020 13.6  12.0 - 16.0 g/dL Final   ??? HCT 09/81/1914 40.8  36.0 - 46.0 % Final   ??? MCV 05/27/2020 83.1  80.0 - 100.0 fL Final   ??? MCH 05/27/2020 27.7  26.0 - 34.0 pg Final   ??? MCHC 05/27/2020 33.3  31.0 - 37.0 g/dL Final   ??? RDW 78/29/5621 16.8* 12.0 - 15.0 % Final   ??? MPV 05/27/2020 8.4  7.0 - 10.0 fL Final   ??? Platelet 05/27/2020 483* 150 - 440 10*9/L Final   ??? Variable HGB Concentration 05/27/2020 Slight* Not Present Final   ??? Neutrophils % 05/27/2020 33.0  % Final   ??? Lymphocytes % 05/27/2020 60.9  % Final   ??? Monocytes % 05/27/2020 1.7  % Final   ??? Eosinophils % 05/27/2020 0.3  % Final   ??? Basophils % 05/27/2020 0.2  % Final   ??? Neutrophil Left Shift 05/27/2020 1+* Not Present Final   ??? Absolute Neutrophils 05/27/2020 0.2* 2.0 - 7.5 10*9/L Final   ??? Absolute Lymphocytes 05/27/2020 0.4* 1.5 - 5.0 10*9/L Final   ??? Absolute Monocytes 05/27/2020 0.0* 0.2 - 0.8 10*9/L Final   ??? Absolute Eosinophils 05/27/2020 0.0  0.0 - 0.4 10*9/L Final   ??? Absolute Basophils 05/27/2020 0.0  0.0 - 0.1 10*9/L Final   ??? Large Unstained Cells 05/27/2020 4  0 - 4 % Final   ??? Microcytosis 05/27/2020 Slight* Not Present Final   ??? Anisocytosis 05/27/2020 Slight* Not Present Final   ??? Hypochromasia 05/27/2020 Slight* Not Present Final   ??? Sodium 05/27/2020 139  135 - 145 mmol/L Final   ??? Potassium 05/27/2020 2.9* 3.4 - 4.5 mmol/L Final   ??? Chloride 05/27/2020 103  98 - 107 mmol/L Final   ??? Anion Gap 05/27/2020 10  5 - 14 mmol/L Final   ??? CO2 05/27/2020 26.0  20.0 - 31.0 mmol/L Final   ??? BUN 05/27/2020 13  9 - 23 mg/dL Final   ??? Creatinine 05/27/2020 1.14* 0.60 - 0.80 mg/dL Final   ??? BUN/Creatinine Ratio 05/27/2020 11   Final   ??? EGFR CKD-EPI Non-African American,* 05/27/2020 49* >=60 mL/min/1.70m2 Final   ??? EGFR CKD-EPI African American, Fem* 05/27/2020 56* >=60 mL/min/1.39m2 Final   ??? Glucose 05/27/2020 93  70 - 179 mg/dL Final   ??? Calcium 30/86/5784 9.1  8.7 - 10.4 mg/dL Final   ??? Albumin 69/62/9528 3.7  3.4 - 5.0 g/dL Final   ??? Total Protein 05/27/2020 7.3  5.7 - 8.2 g/dL Final   ??? Total Bilirubin 05/27/2020 0.9  0.3 - 1.2 mg/dL Final   ??? AST 41/32/4401 15  <=34 U/L Final   ??? ALT 05/27/2020 26  10 - 49 U/L Final   ??? Alkaline Phosphatase 05/27/2020 155* 46 - 116 U/L Final   ??? LDH 05/27/2020 205  120 - 246 U/L Final   ??? Magnesium 05/27/2020 1.8  1.6 - 2.6 mg/dL Final   ??? ABO Grouping 05/27/2020 A POS   Final   ??? Antibody Screen 05/27/2020 NEG   Final   Hospital Outpatient Visit on 05/27/2020   Component Date Value Ref Range Status   ??? Cytogenetics Test, Other 05/27/2020 Collected   Final   ??? Collection 05/27/2020 Collected   Final

## 2020-05-29 NOTE — Unmapped (Signed)
[facility] Clinical Assessment & Refill Coordination Note    Venclexta dose decreased to 100 mg daily.  Pt has plenty on hand -no delivery scheduled    Stephanie Jimenez, DOB: 1950/12/30  Phone: There are no phone numbers on file.    All above HIPAA information was verified with patient.     Was a Nurse, learning disability used for this call? No    Specialty Medication(s):   Hematology/Oncology: Venclexta     Current Outpatient Medications   Medication Sig Dispense Refill   ??? azelastine-fluticasone (DYMISTA) 137-50 mcg/spray nasal spray 2 sprays each nostril q day     ??? clonazePAM (KLONOPIN) 0.5 MG tablet Take 0.75 mg by mouth two (2) times a day.      ??? escitalopram oxalate (LEXAPRO) 20 MG tablet Take 20 mg by mouth every morning.     ??? levothyroxine (SYNTHROID) 125 MCG tablet Take 1 tablet by mouth daily.     ??? loperamide (IMODIUM) 2 mg capsule Take 2 mg by mouth four (4) times a day as needed.     ??? magnesium oxide (MAG-OX) 400 mg (241.3 mg elemental magnesium) tablet Take 1 tablet by mouth daily.     ??? ondansetron (ZOFRAN) 8 MG tablet Take 1 tablet (8 mg total) by mouth every eight (8) hours as needed for nausea. 30 tablet 2   ??? pantoprazole (PROTONIX) 40 MG tablet Take 1 tablet by mouth daily.     ??? posaconazole (NOXAFIL) 100 mg TbEC delayed released tablet Take 300 mg by mouth daily. 90 tablet 5   ??? potassium chloride (KLOR-CON) 10 MEQ CR tablet Take 1 tablet by mouth daily.     ??? valACYclovir (VALTREX) 500 MG tablet TAKE 1 TABLET (500 MG TOTAL) BY MOUTH DAILY. 90 tablet 0   ??? venetoclax (VENCLEXTA) 100 mg tablet Take 1 tablet (100 mg total) by mouth daily. Take with a meal and water. Do not chew, crush, or break tablets. 30 tablet 0     No current facility-administered medications for this visit.        Changes to medications: Stephanie Jimenez reports no changes at this time.    Allergies   Allergen Reactions   ??? Demeclocycline Other (See Comments)     Throat swells  Throat swells     ??? Sulfa (Sulfonamide Antibiotics) Other (See Comments)     Throat swells   ??? Tetracycline Swelling     Throat swelling   ??? Amoxicillin-Pot Clavulanate Diarrhea   ??? Ciprofloxacin Diarrhea   ??? Codeine Dizziness and Other (See Comments)     dizziness     ??? Procaine Other (See Comments)     Shaky   ??? Epinephrine Palpitations     Speeds heart up - panic      ??? Metronidazole Nausea And Vomiting   ??? Prednisone Anxiety     Nervous   Nervous      ??? Tramadol Nausea Only and Other (See Comments)     Sick feeling  Sick feeling         Changes to allergies: No    SPECIALTY MEDICATION ADHERENCE     Venclexta 100 mg: 30 days of medicine on hand     Medication Adherence    Patient reported X missed doses in the last month: 0  Specialty Medication: Venclexta 100 mg tablets once daily (Dose decreased)  Patient is on additional specialty medications: No  Informant: patient  Confirmed plan for next specialty medication refill:  delivery by pharmacy          Specialty medication(s) dose(s) confirmed: Venclexta dose decreased to 100 mg daily     Are there any concerns with adherence? No    Adherence counseling provided? Not needed    CLINICAL MANAGEMENT AND INTERVENTION      Clinical Benefit Assessment:    Do you feel the medicine is effective or helping your condition? Yes    Clinical Benefit counseling provided? Not needed    Adverse Effects Assessment:    Are you experiencing any side effects? Yes, patient reports experiencing fatigue/tired. Side effect counseling provided: yes- fatigue is normal side effect.  Try to schedule more vigorous activities/errands during early part of day. Take breaks as needed.  Report any extreme weakness/fatigue to clinic.    Are you experiencing difficulty administering your medicine? No    Quality of Life Assessment:    How many days over the past month did your AML not in remission  keep you from your normal activities? For example, brushing your teeth or getting up in the morning. 0    Have you discussed this with your provider? Not needed    Therapy Appropriateness:    Is therapy appropriate? Yes, therapy is appropriate and should be continued    DISEASE/MEDICATION-SPECIFIC INFORMATION      N/A    PATIENT SPECIFIC NEEDS     - Does the patient have any physical, cognitive, or cultural barriers? No    - Is the patient high risk? Yes, patient is taking oral chemotherapy. Appropriateness of therapy as been assessed    - Does the patient require a Care Management Plan? No     - Does the patient require physician intervention or other additional services (i.e. nutrition, smoking cessation, social work)? No      SHIPPING     Specialty Medication(s) to be Shipped:   Hematology/Oncology: Venclexta    Other medication(s) to be shipped: No additional medications requested for fill at this time     Changes to insurance: No    Delivery Scheduled: Patient declined refill at this time due to having about 30 tablets on hand.     Medication will be delivered via NA to the confirmed NA address in [facility].    The patient will receive a drug information handout for each medication shipped and additional FDA Medication Guides as required.  Verified that patient has previously received a Conservation officer, historic buildings.    All of the patient's questions and concerns have been addressed.    Breck Coons Shared [facility] Specialty Pharmacist

## 2020-05-29 NOTE — Unmapped (Signed)
[facility]ukemia [facility] Follow-up Visit Note     Patient Name: Stephanie Jimenez  Patient Age: 70 y.o.  Encounter Date: 05/30/2020    Primary Care Provider:  Charm Barges, MD    Referring Physician:  [name], MD  [address]  [place],  [place] [address]    Reason for visit:  AML follow up    Current therapy:   AZA/Ven    Assessment/Plan:  Stephanie Jimenez is a 70 y.o. female with past medical history of anxiety, breast cancer, colon cancer, hypothyroidism, and GERD who presents to establish care for AML. Diagnosed on bone marrow biopsy on 04/08/2020 while undergoing evaluation for persistent leukopenia. She has a remote history (~10-15 years ago) of systemic chemotherapy for breast and colon cancer which raises suspicion of therapy-related AML. However, cytogenetics were not specific or supportive of this diagnosis.     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 2010, Blood, PMID: 60454098) . For older patients (age > 96) unable to undergo intensive induction chemotherapy, the use of venetoclax in combination with an HMA (decitabine or azacitidine) resulted in an CR+CRi of 73%, with a median duration of 11.3 months, and a median overall survival of 17.5 months (DiNardo et al. 2019, Blood, PMID: 11914782). Both, inpatient (high intensity induction chemotherapy) and outpatient (AZA/Ven) treatment options were presented and discussed with the patient. Through shared decision making we decided to go forward with AZA/Ven.    Additionally, after our initial visit with the patient, she was found to have an NPM1 mutation, along with an IDH1 mutation and CEBPA mutation.  These mutations together confer favorable response to AZA/VEN (ignoring the potential for treatment related AML).  Older (>65 years) patients with NPM1 mutated AML had a 88% CR rate with AZA/VEN, and a mOS not reached (Lachowiez, Blood Advances 2020).  IDH1 mutations are also known to confer increased response rates to venetoclax.      She tolerated the first cycle of AZA/Ven very well and post C1 BMBx was consistent with disease remission.     Cycle 2: Treatment will be adjusted as follows guided by C1 bmbx.   ??? MLFS Morphologically leukemia free state: < 5% blasts with 200 cells counted or CRi with ANC < 0.5 or platelets < 25: Hold venetoclax.    o If ANC recovers to > 0.5 and platelets to > 25 within 14 days, restart venetoclax at the therapeutic dose without ramp-up and continue for 21 of 28 days.   o If ANC and platelets do not recover by day 14, repeat bone marrow biopsy  - If > 5% blasts: Consider second induction course, clinical trial, alternative therapy, BSC, hospice.    - If < 5% blasts: Reduce venetoclax at 400 mg for 21 days  For subsequent cycles:   Hold Parameters: Hold treatment for platelets < 25 and ANC < 0.5.  Patients with prolonged cytopenias (i.e. > two weeks) should have a bone marrow biopsy.   Dose reductions: If warranted due to prolonged neutrophil or platelet recovery  ??? First: Reduce VEN to 21 days (from 28 days)  ??? Second: Reduce AZA to 5 days (from 7  days)   ??? Third: Reduce VEN to 14 days (from 21 days)     Supportive Care   ??? Levaquin for ANC < 500  ??? Posaconazole for patients at high risk for invasive fungal infections with prolonged neutropenia or history of invasive fungal infection, exposure to broad-spectrum antibiotics, loss of barrier integrity, high-risk exposure.  If this drug is to be used, venetoclax should be reduced to 100 mg a day.     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  By ELN criteria (again ignoring the history of chemotherapy), this would be favorable risk disease with a high likelihood of responding to chemotherapy. She had a great response to AZA/Ven and achieved remission after C1.   - AZA/VEN - C1D1 12/31 today C1D28  - Stop Ven, posaconazole  - Start Levo 500 mg daily for neutropenia  - Follow up in 1 week for consideration of next chemotherapy cycle  - Reach out to local oncologist (Dr. 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 Unclear significance in the absence of lymphadenopathy, splenomegaly, or other signs of a lymphoid neoplasm.  - CTM    Hypothyroidism: Secondary to treatment of thyroid cancer.  - Consider repeat TSH in 4-weeks which was elevated on most recent testing  - Continue levothyroxine    Psychosocial distress: She reports a high level of anxiety regarding the management of the above.   - Counseling given   - Consider ref to comprehensive cancer support program     Patient-centered care/Shared decision-making:   We discussed the plan above at length. The patient and her friend actively contributed to the conversation. Specifically, her most important outcomes are:   1) Prolonging OS  2) Avoiding Long-term side effects   3) Maintaining her overall functional activity     Supportive Care Needs: We recommend based on the patient???s underlying diagnosis and treatment history the following supportive care:    1. Antimicrobial prophylaxis:  None    2. Blood product support:  Leukoreduced blood products are required.  Irradiated blood products are preferred, but in case of urgent transfusion needs non-irradiated blood products may be used: 2 units for Hg <= 7.0.     Coordination of care:   - Coordinate next cycle of Aza/Ven with local provider and/or here    I personally spent 45 minutes face-to-face and non-face-to-face in the care of this patient, which includes all pre, intra, and post visit time on the date of service.    Jos?? Salena Saner Mart??nez, MD, PhD  Hematology and Oncology Fellow    Seen and discussed with Mariel Aloe, MD  Leukemia Program  Division of Hematology  [facility]      Nurse Navigator (non-clinical trial patients):       Tel. [phone]       Fax. [phone]  Toll-free appointments: [phone]  Scheduling assistance: [phone]  After hours/weekends: [phone] (ask for adult hematology/oncology on-call)        History of Present Illness:   Stephanie Jimenez is a 70 y.o. female with past medical history noted as above who presents to establish care for AML.    She presented  for evaluation of incidentally noted leukopenia in November 2021. Labs obtained around that time showed a normal iron panel, folate, vitamin B12, an elevated TSH, and flow cytometry with 6% myeloblast. She underwent a bone marrow biopsy on December 6th which showed a normocellular bone marrow (30%) involved by therapy-related acute myeloid leukemia (26% blasts by manual aspirate differential) and with a monotypic B-cell population. Review of the EMR shows that her leukopenia dates back to at least June 2020. She reports having a viral illness at the end of 2019 which required a prolonged recovery time. Since then her main health problem has been bilateral hip pain. 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 She has been doing well with physical therapy but apparently will require L hip replacement eventually.    She has a history of colon cancer that was diagnosed in her 41s. She reports having a partial colectomy and undergoing chemotherapy and radiation. She later developed breast cancer in her late 23s which was also treated with resection and chemotherapy. She does not recall the specific chemotherapy regimens or dates treatment. She also has a history of thyroid cancer and s/p resection in 2001.   ??  Her social history is notable for being retired and no history of tobacco, alcohol, or drug use. She lives in [place], [place].     Family history is remarkable for cancer in her father (lung and prostate) and sister (breast), and Alzheimer's in her mother    Otherwise, she denies new constitutional symptoms such as anorexia, weight loss, night sweats or unexplained fevers.  Furthermore, she denies symptoms of marrow failure: unexplained bleeding or bruising, recurrent or unexplained intercurrent infections, dyspnea on exertion, lightheadedness, palpitations or chest pain.  There have been no new or unexplained pains or self-identified masses, swelling or enlarged lymph nodes.    Interval history:  Her main concern is ongoing fatigue since the start of chemotherapy. Symptoms of diarrhea have otherwise improved. She would like to do the next cycle, specifically the AZA injections locally to avoid driving to [facility]. We discussed the results of the bone marrow biopsy which demonstrated that her disease is in remissions.     Otherwise, denies new bone pain, fevers, chills, night sweats, lumps/bumps, tongue swelling, shortness of breath, syncope, lightheadedness, constipation, nausea or vomiting, very easy bruising or bleeding, or urinary changes.     Oncology History is as below:   Oncology History Overview Note   Referring/Local Oncologist: Dr. Orlie Dakin, [facility]    Diagnosis:   Final Diagnosis   Date Value Ref Range Status   04/08/2020   Final    (Outside case: [phone], dated 04/08/2020)  Bone marrow, aspiration and biopsy  -  Normocellular bone marrow (30%) involved by therapy-related acute myeloid leukemia (26% blasts by manual aspirate differential) (See Comment)  -   Monotypic B-cell population with partial CD5 expression identified by outside flow cytometric analysis (See Comment)    This electronic signature is attestation that the pathologist personally reviewed the submitted material(s) and the final diagnosis reflects that evaluation.            Genetics:   Karyotype/FISH: 46,XX,t(1;12)(p36.3;q13)[9]/46,XX[11]     Molecular Genetics: NPM1, IDH1, CEBPA (single allele)          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CMS-HCC)   04/12/2020 Initial Diagnosis    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CMS-HCC)     05/03/2020 -  Chemotherapy    IP/OP AML AZACITIDINE + VENETOCLAX  azacitidine 75 mg/m2 SQ on days  1-7, venetoclax ramp up week 1; dose dependent, then azacitidine 75 mg/m2 SQ on days 1-7 every 28 days.         Review of Systems:   ROS reviewed and negative except as noted in H and P     Allergies:  Allergies   Allergen Reactions   ??? Demeclocycline Other (See Comments)     Throat swells  Throat swells     ??? Sulfa (Sulfonamide Antibiotics) Other (See Comments)     Throat swells   ??? Tetracycline Swelling     Throat swelling   ??? Amoxicillin-Pot Clavulanate Diarrhea   ??? Ciprofloxacin Diarrhea   ??? Codeine Dizziness and Other (See Comments)     dizziness     ??? Procaine Other (See Comments)     Shaky   ??? Epinephrine Palpitations     Speeds heart up - panic      ??? Metronidazole Nausea And Vomiting   ??? Prednisone Anxiety     Nervous   Nervous      ??? Tramadol Nausea Only and Other (See Comments)     Sick feeling  Sick feeling         Medications:     Current Outpatient Medications:   ???  azelastine-fluticasone (DYMISTA) 137-50 mcg/spray nasal spray, 2 sprays each nostril q day, Disp: , Rfl:   ???  clonazePAM (KLONOPIN) 0.5 MG tablet, Take 0.75 mg by mouth two (2) times a day. , Disp: , Rfl:   ???  escitalopram oxalate (LEXAPRO) 20 MG tablet, Take 20 mg by mouth every morning., Disp: , Rfl:   ???  levothyroxine (SYNTHROID) 125 MCG tablet, Take 1 tablet by mouth daily., Disp: , Rfl:   ???  loperamide (IMODIUM) 2 mg capsule, Take 2 mg by mouth four (4) times a day as needed., Disp: , Rfl:   ???  magnesium oxide (MAG-OX) 400 mg (241.3 mg elemental magnesium) tablet, Take 1 tablet by mouth daily., Disp: , Rfl:   ???  ondansetron (ZOFRAN) 8 MG tablet, Take 1 tablet (8 mg total) by mouth every eight (8) hours as needed for nausea., Disp: 30 tablet, Rfl: 2  ???  pantoprazole (PROTONIX) 40 MG tablet, Take 1 tablet by mouth daily., Disp: , Rfl:   ???  posaconazole (NOXAFIL) 100 mg TbEC delayed released tablet, Take 300 mg by mouth daily., Disp: 90 tablet, Rfl: 5  ???  potassium chloride (KLOR-CON) 10 MEQ CR tablet, Take 1 tablet by mouth daily., Disp: , Rfl:   ???  valACYclovir (VALTREX) 500 MG tablet, TAKE 1 TABLET (500 MG TOTAL) BY MOUTH DAILY., Disp: 90 tablet, Rfl: 0  ???  venetoclax (VENCLEXTA) 100 mg tablet, Take 1 tablet (100 mg total) by mouth daily. Take with a meal and water. Do not chew, crush, or break tablets., Disp: 30 tablet, Rfl: 0  ???  levoFLOXacin (LEVAQUIN) 500 MG tablet, Take 1 tablet (500 mg total) by mouth daily., Disp: 7 tablet, Rfl: 0    Medical History:  As per HPI    Social History:  As per HPI    Family History:  As per HPI    Objective:   BP 142/74  - Pulse 72  - Temp 37 ??C (98.6 ??F) (Oral)  - Resp 18  - Ht 163.7 cm (5' 4.45)  - Wt 62.8 kg (138 lb 6.4 oz)  - SpO2 100%  - BMI 23.43 kg/m??     Physical Exam:  General: Resting, in no apparent distress  HEENT:  No scleral icterus or conjunctival injection. Oral mucosa without ulceration, erythema or exudate.   CV:  RRR.  S1, S2.  No murmurs, gallops or rubs.  Resp:  Breathing is unlabored, and patient is speaking full sentences with ease.  No stridor.  CTAB. No rales, ronchi or crackles.    GI:  No distention or pain on palpation.  Bowel sounds are present and normal in quality.  No palpable hepatomegaly or splenomegaly.  No palpable masses.  Skin:  No rashes, petechiae or purpura.  No areas of skin breakdown. Warm to touch, dry, smooth and even.  Musculoskeletal:  No grossly-evident joint effusions or deformities.   Psychiatric:  Alert and oriented to person, place, time and situation.  Range of affect is appropriate.    Neurologic:  Grossly normal motor and sensory functions. Gait is normal.   Extremities:  Appear well-perfused.  No clubbing, edema or cyanosis.    Test Results:  Recent Results (from the past 48 hour(s))   CBC w/ Differential    Collection Time: 05/30/20 10:27 AM   Result Value Ref Range    WBC 0.3 (LL) 4.5 - 11.0 10*9/L    RBC 3.65 (L) 4.00 - 5.20 10*12/L    HGB 9.8 (L) 12.0 - 16.0 g/dL    HCT 16.1 (L) 09.6 - 46.0 %    MCV 84.5 80.0 - 100.0 fL    MCH 26.9 26.0 - 34.0 pg    MCHC 31.8 31.0 - 37.0 g/dL    RDW 04.5 (H) 40.9 - 15.0 %    MPV 8.5 7.0 - 10.0 fL    Platelet 307 150 - 440 10*9/L    Variable HGB Concentration Slight (A) Not Present    Neutrophils % 9.8 %    Lymphocytes % 78.4 %    Monocytes % 3.4 %    Eosinophils % 1.5 %    Basophils % 0.0 %    Absolute Neutrophils 0.0 (LL) 2.0 - 7.5 10*9/L    Absolute Lymphocytes 0.2 (L) 1.5 - 5.0 10*9/L    Absolute Monocytes 0.0 (L) 0.2 - 0.8 10*9/L    Absolute Eosinophils 0.0 0.0 - 0.4 10*9/L    Absolute Basophils 0.0 0.0 - 0.1 10*9/L    Large Unstained Cells 7 (H) 0 - 4 %    Anisocytosis Slight (A) Not Present    Hypochromasia Moderate (A) Not Present   Morphology Review    Collection Time: 05/30/20 10:27 AM   Result Value Ref Range    Smear Review Comments See Comment (A) Undefined    Ovalocytes Moderate (A) Not Present    Poikilocytosis Moderate (A) Not Present

## 2020-05-30 ENCOUNTER — Ambulatory Visit
Admit: 2020-05-30 | Discharge: 2020-05-30 | Payer: PRIVATE HEALTH INSURANCE | Attending: Hematology | Primary: Hematology

## 2020-05-30 ENCOUNTER — Ambulatory Visit: Admit: 2020-05-30 | Discharge: 2020-05-30 | Payer: PRIVATE HEALTH INSURANCE

## 2020-05-30 ENCOUNTER — Other Ambulatory Visit: Admit: 2020-05-30 | Discharge: 2020-05-30 | Payer: PRIVATE HEALTH INSURANCE

## 2020-05-30 DIAGNOSIS — C9201 Acute myeloblastic leukemia, in remission: Secondary | ICD-10-CM | POA: Diagnosis not present

## 2020-05-30 DIAGNOSIS — D701 Agranulocytosis secondary to cancer chemotherapy: Secondary | ICD-10-CM | POA: Diagnosis not present

## 2020-05-30 DIAGNOSIS — T451X5A Adverse effect of antineoplastic and immunosuppressive drugs, initial encounter: Secondary | ICD-10-CM | POA: Diagnosis not present

## 2020-05-30 DIAGNOSIS — C9200 Acute myeloblastic leukemia, not having achieved remission: Secondary | ICD-10-CM | POA: Diagnosis not present

## 2020-05-30 LAB — CBC W/ AUTO DIFF
BASOPHILS ABSOLUTE COUNT: 0 10*9/L (ref 0.0–0.1)
BASOPHILS RELATIVE PERCENT: 0 %
EOSINOPHILS ABSOLUTE COUNT: 0 10*9/L (ref 0.0–0.4)
EOSINOPHILS RELATIVE PERCENT: 1.5 %
HEMATOCRIT: 30.8 % — ABNORMAL LOW (ref 36.0–46.0)
HEMOGLOBIN: 9.8 g/dL — ABNORMAL LOW (ref 12.0–16.0)
LARGE UNSTAINED CELLS: 7 % — ABNORMAL HIGH (ref 0–4)
LYMPHOCYTES ABSOLUTE COUNT: 0.2 10*9/L — ABNORMAL LOW (ref 1.5–5.0)
LYMPHOCYTES RELATIVE PERCENT: 78.4 %
MEAN CORPUSCULAR HEMOGLOBIN CONC: 31.8 g/dL (ref 31.0–37.0)
MEAN CORPUSCULAR HEMOGLOBIN: 26.9 pg (ref 26.0–34.0)
MEAN CORPUSCULAR VOLUME: 84.5 fL (ref 80.0–100.0)
MEAN PLATELET VOLUME: 8.5 fL (ref 7.0–10.0)
MONOCYTES ABSOLUTE COUNT: 0 10*9/L — ABNORMAL LOW (ref 0.2–0.8)
MONOCYTES RELATIVE PERCENT: 3.4 %
NEUTROPHILS ABSOLUTE COUNT: 0 10*9/L — CL (ref 2.0–7.5)
NEUTROPHILS RELATIVE PERCENT: 9.8 %
PLATELET COUNT: 307 10*9/L (ref 150–440)
RED BLOOD CELL COUNT: 3.65 10*12/L — ABNORMAL LOW (ref 4.00–5.20)
RED CELL DISTRIBUTION WIDTH: 17 % — ABNORMAL HIGH (ref 12.0–15.0)
WBC ADJUSTED: 0.3 10*9/L — CL (ref 4.5–11.0)

## 2020-05-30 LAB — SLIDE REVIEW

## 2020-05-30 MED ORDER — LEVOFLOXACIN 500 MG TABLET
ORAL_TABLET | Freq: Every day | ORAL | 0 refills | 7.00000 days | Status: CP
Start: 2020-05-30 — End: 2020-06-04

## 2020-05-30 NOTE — Unmapped (Addendum)
Nice to see you today.     We discussed the following:      1. AML - Congratulations, you are in remission! Stop taking posaconazole (NOXAFIL) and venetoclax (VENCLEXTA). We will allow some time for your bone marrow to recover before the next cycle of chemotherapy. Now that your counts are low it is important that you call if you have a fever. Start taking levofloxacin (LEVAQUIN) 1 pill (500 mg) daily for at least 7-days until we recheck your counts.    We will see you back in 1 week if you are not able to schedule with Dr. Orlie Dakin.  .   Labs 1 week    Mariel Aloe, MD  Leukemia Program     Nurse Navigator (non-clinical trial patients):       Tel. [phone]       Fax. [phone]  Toll-free appointments: [phone]  Scheduling assistance: [phone]  After hours/weekends: [phone] (ask for adult hematology/oncology on-call)      Lab Results   Component Value Date    WBC 0.3 (LL) 05/30/2020    HGB 9.8 (L) 05/30/2020    HCT 30.8 (L) 05/30/2020    PLT 307 05/30/2020       Lab Results   Component Value Date    NA 139 05/27/2020    K 2.9 (L) 05/27/2020    CL 103 05/27/2020    CO2 26.0 05/27/2020    BUN 13 05/27/2020    CREATININE 1.14 (H) 05/27/2020    GLU 93 05/27/2020    CALCIUM 9.1 05/27/2020    MG 1.8 05/27/2020    PHOS 3.7 05/09/2020       Lab Results   Component Value Date    BILITOT 0.9 05/27/2020    BILIDIR 0.30 05/09/2020    PROT 7.3 05/27/2020    ALBUMIN 3.7 05/27/2020    ALT 26 05/27/2020    AST 15 05/27/2020    ALKPHOS 155 (H) 05/27/2020       No results found for: LABPROT, INR, APTT

## 2020-05-31 NOTE — Unmapped (Signed)
Evans Lance with [facility] contacted the Oncology Communication Center today stating that she was calling about a mutual patient Stephanie Jimenez and she wanted to discuss the patients plan of care with the Provider Dr. Mariel Aloe. Dr. Senaida Ores please contact the provider at this hospital [phone].     .Thank you,   Noland Fordyce  [facility] Communication Center  [phone]

## 2020-05-31 NOTE — Unmapped (Signed)
[id number] Attempt to call Enrique Sack from [facility], no answer, number for Dr. Gerarda Fraction. Gave this number to Dr. Senaida Ores to call and speak with Dr. Orlie Dakin regarding this mutual patient and plan of care.

## 2020-06-01 ENCOUNTER — Other Ambulatory Visit: Payer: Self-pay | Admitting: Oncology

## 2020-06-01 NOTE — Progress Notes (Signed)
START OFF PATHWAY REGIMEN - Other   [id number]:Azacitidine 75 mg/m2 SUBQ D1-7 q28 Days:   A cycle is every 28 days:     Azacitidine   **Always confirm dose/schedule in your pharmacy ordering system**  Patient Characteristics: Intent of Therapy: Non-Curative / Palliative Intent, Discussed with Patient

## 2020-06-03 ENCOUNTER — Telehealth: Payer: Self-pay | Admitting: Oncology

## 2020-06-03 NOTE — Telephone Encounter (Signed)
Pt called and states that she is supposed to start chemo with Dr. [name] per Dr. Marvel Plan. She states that she does not want to go to [facility] and would like to have her treatments in [place].

## 2020-06-03 NOTE — Unmapped (Signed)
Called patient to inform her of aza injection appt. Patient stated the Dr. Senaida Ores told her she could get the injection done in [place] she is unable to come to [facility]. I informed patient I would cancel appts and inform provider.

## 2020-06-03 NOTE — Telephone Encounter (Signed)
Actively working on it now.

## 2020-06-04 ENCOUNTER — Telehealth
Admit: 2020-06-04 | Discharge: 2020-06-05 | Payer: PRIVATE HEALTH INSURANCE | Attending: Adult Health | Primary: Adult Health

## 2020-06-04 DIAGNOSIS — C9201 Acute myeloblastic leukemia, in remission: Secondary | ICD-10-CM | POA: Diagnosis not present

## 2020-06-04 DIAGNOSIS — T451X5A Adverse effect of antineoplastic and immunosuppressive drugs, initial encounter: Secondary | ICD-10-CM | POA: Diagnosis not present

## 2020-06-04 DIAGNOSIS — D701 Agranulocytosis secondary to cancer chemotherapy: Secondary | ICD-10-CM | POA: Diagnosis not present

## 2020-06-04 MED ORDER — LEVOFLOXACIN 500 MG TABLET
ORAL_TABLET | Freq: Every day | ORAL | 2 refills | 30 days | Status: CP
Start: 2020-06-04 — End: ?

## 2020-06-04 NOTE — Unmapped (Signed)
This patient visit was completed through the use of an audio/video or telephone encounter.    I spent 22 minutes on the real-time audio and video with the patient. I spent an additional 20 minutes on pre- and post-visit activities.     The patient was physically located in [place] or a state in which I am permitted to provide care. The patient understood that s/he may incur co-pays and cost sharing, and agreed to the telemedicine visit. The visit was completed via phone and/or video, which was appropriate and reasonable under the circumstances given the patient's presentation at the time.    The patient has been advised of the potential risks and limitations of this mode of treatment (including, but not limited to, the absence of in-person examination) and has agreed to be treated using telemedicine. The patient's/patient's family's questions regarding telemedicine have been answered.     If the phone/video visit was completed in an ambulatory setting, the patient has also been advised to contact their provider???s office for worsening conditions, and seek emergency medical treatment and/or call 911 if the patient deems either necessary.    Visit conducted by: Video  Person contacted: [name] phone number: [phone]  Is there someone else in the room? no       [facility] Follow-up Visit Note     Patient Name: Stephanie Jimenez  Patient Age: 70 y.o.  Encounter Date: 06/04/2020    Primary Care Provider:  Charm Barges, MD    Referring Physician:  [name], MD  [address]  [place],  [place] [address]    Reason for visit:  AML follow up    Current therapy:   AZA/Ven    Assessment/Plan:  Stephanie Jimenez is a 70 y.o. female with past medical history of anxiety, breast cancer, colon cancer, hypothyroidism, and GERD who presents to establish care for AML. Diagnosed on bone marrow biopsy on 04/08/2020 while undergoing evaluation for persistent leukopenia. She has a remote history (~10-15 years ago) of systemic chemotherapy for breast and colon cancer which raises suspicion of therapy-related AML. However, cytogenetics were not specific or supportive of this diagnosis.     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 2010, Blood, PMID: 24401027) . For older patients (age > 17) unable to undergo intensive induction chemotherapy, the use of venetoclax in combination with an HMA (decitabine or azacitidine) resulted in an CR+CRi of 73%, with a median duration of 11.3 months, and a median overall survival of 17.5 months (DiNardo et al. 2019, Blood, PMID: 25366440). Both, inpatient (high intensity induction chemotherapy) and outpatient (AZA/Ven) treatment options were presented and discussed with the patient. Through shared decision making we decided to go forward with AZA/Ven.    Additionally, after our initial visit with the patient, she was found to have an NPM1 mutation, along with an IDH1 mutation and CEBPA mutation.  These mutations together confer favorable response to AZA/VEN (ignoring the potential for treatment related AML).  Older (>65 years) patients with NPM1 mutated AML had a 88% CR rate with AZA/VEN, and a mOS not reached (Lachowiez, Blood Advances 2020).  IDH1 mutations  are also known to confer increased response rates to venetoclax.      She tolerated the first cycle of AZA/Ven very well and post C1 BMBx was consistent with disease remission.     Cycle 2: Treatment will be adjusted as follows guided by C1 bmbx.   ??? MLFS Morphologically leukemia free state: < 5% blasts with 200 cells counted or CRi with ANC < 0.5 or platelets < 25: Hold venetoclax.    o If ANC recovers to > 0.5 and platelets to > 25 within 14 days, restart venetoclax at the therapeutic dose without ramp-up and continue for 21 of 28 days.   o If ANC and platelets do not recover by day 14, repeat bone marrow biopsy  - If > 5% blasts: Consider second induction course, clinical trial, alternative therapy, BSC, hospice.    - If < 5% blasts: Reduce venetoclax at 400 mg for 21 days  For subsequent cycles:   Hold Parameters: Hold treatment for platelets < 25 and ANC < 0.5.  Patients with prolonged cytopenias (i.e. > two weeks) should have a bone marrow biopsy.   Dose reductions: If warranted due to prolonged neutrophil or platelet recovery  ??? First: Reduce VEN to 21 days (from 28 days)  ??? Second: Reduce AZA to 5 days (from 7 days)   ??? Third: Reduce VEN to 14 days (from 21 days)     Supportive Care   ??? Levaquin for ANC < 500  ??? Posaconazole for patients at high risk for invasive fungal infections with prolonged neutropenia or history of invasive fungal infection, exposure to broad-spectrum antibiotics, loss of barrier integrity, high-risk exposure.  If this drug is to be used, venetoclax should be reduced to 100 mg a day.     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  By ELN criteria (again ignoring the history of chemotherapy), this would be favorable risk disease with a high likelihood of responding to chemotherapy. She had a great response to AZA/Ven and achieved remission after C1.   - AZA/VEN - C1D1 12/31 today C1D33  - Off ven and posa  - continue Levo 500 mg daily for neutropenia  - follow up with Dr. 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 Unclear significance in the absence of lymphadenopathy, splenomegaly, or other signs of a lymphoid neoplasm.  - CTM    Hypothyroidism: Secondary to treatment of thyroid cancer.  - Consider repeat TSH in 4-weeks which was elevated on most recent testing  - Continue levothyroxine    Psychosocial distress: She reports a high level of anxiety regarding the management of the above.   - Counseling given   - Consider ref to comprehensive cancer support program     Patient-centered care/Shared decision-making:   We discussed the plan above at length. The patient and her friend actively contributed to the conversation. Specifically, her most important outcomes are:   1) Prolonging OS  2) Avoiding Long-term side effects   3) Maintaining her overall functional activity     Supportive  Care Needs: We recommend based on the patient???s underlying diagnosis and treatment history the following supportive care:    1. Antimicrobial prophylaxis:  None    2. Blood product support:  Leukoreduced blood products are required.  Irradiated blood products are preferred, but in case of urgent transfusion needs non-irradiated blood products may be used: 2 units for Hg <= 7.0.     Coordination of care:   - next cycle potentially start next Monday  - Follow up in 1 month with Dr. Senaida Ores via video visit    Markus Jarvis, RN, MSN, AGPCNP-C  Nurse Practitioner  Hematologic Malignancies  [facility]  [phone] (phone)  [phone] (fax)  [email or web address]      Mariel Aloe, MD  Leukemia Program  Division of Hematology  [facility]      Nurse Navigator (non-clinical trial patients):       Tel. [phone]       Fax. [phone]  Toll-free appointments: [phone]  Scheduling assistance: [phone]  After hours/weekends: [phone] (ask for adult hematology/oncology on-call)        History of Present Illness:   Stephanie Jimenez is a 70 y.o. female with past medical history noted as above who presents to establish care for AML.    She presented for evaluation of incidentally noted leukopenia in November 2021. Labs obtained around that time showed a normal iron panel, folate, vitamin B12, an elevated TSH, and flow cytometry with 6% myeloblast. She underwent a bone marrow biopsy on December 6th which showed a normocellular bone marrow (30%) involved by therapy-related acute myeloid leukemia (26% blasts by manual aspirate differential) and with a monotypic B-cell population. Review of the EMR shows that her leukopenia dates back to at least June 2020. She reports having a viral illness at the end of 2019 which required a prolonged recovery time. Since then her main health problem has been bilateral hip pain. 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 She has been doing well with physical therapy but apparently will require L hip replacement eventually.    She has a history of colon cancer that was diagnosed in her 48s. She reports having a partial colectomy and undergoing chemotherapy and radiation. She later developed breast cancer in her late 63s which was also treated with resection and chemotherapy. She does not recall the specific chemotherapy regimens or dates treatment. She also has a history of thyroid cancer and s/p resection in 2001.   ??  Her social history is notable for being retired and no history of tobacco, alcohol, or drug use. She lives in [place], [place].     Family history is remarkable for cancer in her father (lung and prostate) and sister (breast), and Alzheimer's in her mother    Otherwise, she denies new constitutional symptoms such as anorexia, weight loss, night sweats or unexplained fevers.  Furthermore, she denies symptoms of marrow failure: unexplained bleeding or bruising, recurrent or unexplained intercurrent infections, dyspnea on exertion, lightheadedness, palpitations or chest pain.  There have been no new or unexplained pains or self-identified masses, swelling or enlarged lymph nodes.    Interval history:  Getting chemo locally on Monday.  She is stil having some fatigue.  Still with her diarrhea, though fluctuates between this and constipation. She's not eating much because her appetite is still low.  Did have some cold like symptoms on Saturday and took some tylenol.  Notes that she had some intermittent palpitations      Otherwise, denies new  bone pain, fevers, chills, night sweats, lumps/bumps, tongue swelling, shortness of breath, syncope, lightheadedness, constipation, nausea or vomiting, very easy bruising or bleeding, or urinary changes.     Oncology History is as below:   Oncology History Overview Note   Referring/Local Oncologist: Dr. Orlie Dakin, [facility]    Diagnosis:   Final Diagnosis   Date Value Ref Range Status   04/08/2020   Final    (Outside case: [phone], dated 04/08/2020)  Bone marrow, aspiration and biopsy  -  Normocellular bone marrow (30%) involved by therapy-related acute myeloid leukemia (26% blasts by manual aspirate differential) (See Comment)  -   Monotypic B-cell population with partial CD5 expression identified by outside flow cytometric analysis (See Comment)    This electronic signature is attestation that the pathologist personally reviewed the submitted material(s) and the final diagnosis reflects that evaluation.            Genetics:   Karyotype/FISH: 46,XX,t(1;12)(p36.3;q13)[9]/46,XX[11]     Molecular Genetics: NPM1, IDH1, CEBPA (single allele)          Acute myeloid leukemia in remission (CMS-HCC)   04/12/2020 Initial Diagnosis    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CMS-HCC)     05/03/2020 -  Chemotherapy    IP/OP AML AZACITIDINE + VENETOCLAX  azacitidine 75 mg/m2 SQ on days 1-7, venetoclax ramp up week 1; dose dependent, then azacitidine 75 mg/m2 SQ on days 1-7 every 28 days.     05/27/2020 Biopsy    Final Diagnosis   Bone marrow, right iliac, aspiration and biopsy  -  Hypocellular bone marrow (5-10%) with reduced erythroid-predominant trilineage hematopoiesis, mild cytologic dyspoiesis and <1% blasts by manual aspirate differential            Review of Systems:   ROS reviewed and negative except as noted in H and P     Allergies:  Allergies   Allergen Reactions   ??? Demeclocycline Other (See Comments)     Throat swells  Throat swells     ??? Sulfa (Sulfonamide Antibiotics) Other (See Comments)     Throat swells   ??? Tetracycline Swelling     Throat swelling   ??? Amoxicillin-Pot Clavulanate Diarrhea   ??? Ciprofloxacin Diarrhea   ??? Codeine Dizziness and Other (See Comments)     dizziness     ??? Procaine Other (See Comments)     Shaky   ??? Epinephrine Palpitations     Speeds heart up - panic      ??? Metronidazole Nausea And Vomiting   ??? Prednisone Anxiety     Nervous   Nervous      ??? Tramadol Nausea Only and Other (See Comments)     Sick feeling  Sick feeling         Medications:     Current Outpatient Medications:   ???  azelastine-fluticasone (DYMISTA) 137-50 mcg/spray nasal spray, 2 sprays each nostril q day, Disp: , Rfl:   ???  clonazePAM (KLONOPIN) 0.5 MG tablet, Take 0.75 mg by mouth two (2) times a day. , Disp: , Rfl:   ???  escitalopram oxalate (LEXAPRO) 20 MG tablet, Take 20 mg by mouth every morning., Disp: , Rfl:   ???  levoFLOXacin (LEVAQUIN) 500 MG tablet, Take 1 tablet (500 mg total) by mouth daily., Disp: 7 tablet, Rfl: 0  ???  levothyroxine (SYNTHROID) 125 MCG tablet, Take 1 tablet by mouth daily., Disp: , Rfl:   ???  loperamide (IMODIUM) 2 mg capsule, Take 2 mg by mouth four (4) times  a day as needed., Disp: , Rfl:   ???  magnesium oxide (MAG-OX) 400 mg (241.3 mg elemental magnesium) tablet, Take 1 tablet by mouth daily., Disp: , Rfl:   ???  ondansetron (ZOFRAN) 8 MG tablet, Take 1 tablet (8 mg total) by mouth every eight (8) hours as needed for nausea., Disp: 30 tablet, Rfl: 2  ??? pantoprazole (PROTONIX) 40 MG tablet, Take 1 tablet by mouth daily., Disp: , Rfl:   ???  potassium chloride (KLOR-CON) 10 MEQ CR tablet, Take 1 tablet by mouth daily., Disp: , Rfl:   ???  valACYclovir (VALTREX) 500 MG tablet, TAKE 1 TABLET (500 MG TOTAL) BY MOUTH DAILY., Disp: 90 tablet, Rfl: 0  ???  posaconazole (NOXAFIL) 100 mg TbEC delayed released tablet, Take 300 mg by mouth daily. (Patient not taking: Reported on 06/04/2020), Disp: 90 tablet, Rfl: 5  ???  venetoclax (VENCLEXTA) 100 mg tablet, Take 1 tablet (100 mg total) by mouth daily. Take with a meal and water. Do not chew, crush, or break tablets. (Patient not taking: Reported on 06/04/2020), Disp: 30 tablet, Rfl: 0    Medical History:  As per HPI    Social History:  As per HPI    Family History:  As per HPI    Objective:   There were no vitals taken for this visit.    Physical Exam:  General: Resting, in no apparent distress  HEENT:  No scleral icterus or conjunctival injection. Oral mucosa without ulceration, erythema or exudate.   CV:  RRR.  S1, S2.  No murmurs, gallops or rubs.  Resp:  Breathing is unlabored, and patient is speaking full sentences with ease.  No stridor.  CTAB. No rales, ronchi or crackles.    GI:  No distention or pain on palpation.  Bowel sounds are present and normal in quality.  No palpable hepatomegaly or splenomegaly.  No palpable masses.  Skin:  No rashes, petechiae or purpura.  No areas of skin breakdown. Warm to touch, dry, smooth and even.  Musculoskeletal:  No grossly-evident joint effusions or deformities.   Psychiatric:  Alert and oriented to person, place, time and situation.  Range of affect is appropriate.    Neurologic:  Grossly normal motor and sensory functions. Gait is normal.   Extremities:  Appear well-perfused.  No clubbing, edema or cyanosis.    Test Results:  No results found for this or any previous visit (from the past 48 hour(s)).

## 2020-06-04 NOTE — Unmapped (Signed)
Completed med. Review. Gave patient instructions for video visit.

## 2020-06-06 ENCOUNTER — Other Ambulatory Visit: Payer: Self-pay | Admitting: Internal Medicine

## 2020-06-06 DIAGNOSIS — E039 Hypothyroidism, unspecified: Secondary | ICD-10-CM

## 2020-06-06 NOTE — Unmapped (Signed)
Called Ms. Stephanie Jimenez today to review follow up plan.  She reports that she is not feeling well and continues to have diarrhea.  She is no longer on her venetoclax.  She took some imodium and it did help but then she had liquid diarrhea this morning.  She is not eating because things go right through her.  She was previously established at the [facility] in [place] for her GI care.  I advised that she call them ASAP and make an appointment with them.  She agreed.    We discussed her next cycle of chemo again.  Reviewed that she will see Dr. Orlie Dakin on Monday and that she will start her shot and chemo pills then if her ANC is >0.5.  Reviewed again that she will go in for her shots for 7 days, which will likely be split to M-F, M, T because they are not open weekends.  She will not start her venetoclax until she starts her shots and she will start them on the same day.    I had to repeat the same information several times.  I did clarify that she is NOT taking ven or posa currently.    I spent 22 minutes on the phone with the patient.

## 2020-06-07 ENCOUNTER — Other Ambulatory Visit: Payer: Self-pay | Admitting: *Deleted

## 2020-06-07 ENCOUNTER — Ambulatory Visit: Payer: PPO

## 2020-06-07 DIAGNOSIS — C9200 Acute myeloblastic leukemia, not having achieved remission: Secondary | ICD-10-CM

## 2020-06-10 ENCOUNTER — Inpatient Hospital Stay (HOSPITAL_BASED_OUTPATIENT_CLINIC_OR_DEPARTMENT_OTHER): Payer: PPO | Admitting: Oncology

## 2020-06-10 ENCOUNTER — Inpatient Hospital Stay: Payer: PPO | Attending: Oncology

## 2020-06-10 ENCOUNTER — Encounter: Payer: Self-pay | Admitting: *Deleted

## 2020-06-10 ENCOUNTER — Encounter: Payer: Self-pay | Admitting: Oncology

## 2020-06-10 ENCOUNTER — Other Ambulatory Visit: Payer: Self-pay

## 2020-06-10 ENCOUNTER — Other Ambulatory Visit: Payer: Self-pay | Admitting: Oncology

## 2020-06-10 ENCOUNTER — Inpatient Hospital Stay: Payer: PPO

## 2020-06-10 ENCOUNTER — Telehealth: Payer: Self-pay | Admitting: *Deleted

## 2020-06-10 VITALS — BP 105/67 | HR 79 | Temp 96.3°F | Resp 20 | Wt 132.9 lb

## 2020-06-10 DIAGNOSIS — R110 Nausea: Secondary | ICD-10-CM | POA: Diagnosis not present

## 2020-06-10 DIAGNOSIS — E876 Hypokalemia: Secondary | ICD-10-CM

## 2020-06-10 DIAGNOSIS — K611 Rectal abscess: Secondary | ICD-10-CM | POA: Diagnosis not present

## 2020-06-10 DIAGNOSIS — E785 Hyperlipidemia, unspecified: Secondary | ICD-10-CM | POA: Diagnosis not present

## 2020-06-10 DIAGNOSIS — R197 Diarrhea, unspecified: Secondary | ICD-10-CM | POA: Diagnosis not present

## 2020-06-10 DIAGNOSIS — D701 Agranulocytosis secondary to cancer chemotherapy: Secondary | ICD-10-CM | POA: Insufficient documentation

## 2020-06-10 DIAGNOSIS — Z515 Encounter for palliative care: Secondary | ICD-10-CM | POA: Diagnosis not present

## 2020-06-10 DIAGNOSIS — K219 Gastro-esophageal reflux disease without esophagitis: Secondary | ICD-10-CM | POA: Diagnosis not present

## 2020-06-10 DIAGNOSIS — K649 Unspecified hemorrhoids: Secondary | ICD-10-CM | POA: Diagnosis not present

## 2020-06-10 DIAGNOSIS — Z79899 Other long term (current) drug therapy: Secondary | ICD-10-CM | POA: Diagnosis not present

## 2020-06-10 DIAGNOSIS — C9200 Acute myeloblastic leukemia, not having achieved remission: Secondary | ICD-10-CM | POA: Diagnosis not present

## 2020-06-10 DIAGNOSIS — E860 Dehydration: Secondary | ICD-10-CM | POA: Insufficient documentation

## 2020-06-10 DIAGNOSIS — E039 Hypothyroidism, unspecified: Secondary | ICD-10-CM | POA: Insufficient documentation

## 2020-06-10 DIAGNOSIS — E7800 Pure hypercholesterolemia, unspecified: Secondary | ICD-10-CM | POA: Diagnosis not present

## 2020-06-10 DIAGNOSIS — Z85850 Personal history of malignant neoplasm of thyroid: Secondary | ICD-10-CM | POA: Diagnosis not present

## 2020-06-10 DIAGNOSIS — Z5111 Encounter for antineoplastic chemotherapy: Secondary | ICD-10-CM | POA: Insufficient documentation

## 2020-06-10 DIAGNOSIS — Z853 Personal history of malignant neoplasm of breast: Secondary | ICD-10-CM | POA: Diagnosis not present

## 2020-06-10 DIAGNOSIS — F419 Anxiety disorder, unspecified: Secondary | ICD-10-CM | POA: Diagnosis not present

## 2020-06-10 DIAGNOSIS — M1990 Unspecified osteoarthritis, unspecified site: Secondary | ICD-10-CM | POA: Insufficient documentation

## 2020-06-10 DIAGNOSIS — R531 Weakness: Secondary | ICD-10-CM | POA: Insufficient documentation

## 2020-06-10 DIAGNOSIS — M810 Age-related osteoporosis without current pathological fracture: Secondary | ICD-10-CM | POA: Insufficient documentation

## 2020-06-10 DIAGNOSIS — Z9114 Patient's other noncompliance with medication regimen: Secondary | ICD-10-CM | POA: Insufficient documentation

## 2020-06-10 LAB — COMPREHENSIVE METABOLIC PANEL
ALT: 25 U/L (ref 0–44)
AST: 17 U/L (ref 15–41)
Albumin: 3.6 g/dL (ref 3.5–5.0)
Alkaline Phosphatase: 107 U/L (ref 38–126)
Anion gap: 10 (ref 5–15)
BUN: 18 mg/dL (ref 8–23)
CO2: 27 mmol/L (ref 22–32)
Calcium: 8.5 mg/dL — ABNORMAL LOW (ref 8.9–10.3)
Chloride: 105 mmol/L (ref 98–111)
Creatinine, Ser: 1.14 mg/dL — ABNORMAL HIGH (ref 0.44–1.00)
GFR, Estimated: 52 mL/min — ABNORMAL LOW (ref >60)
Glucose, Bld: 117 mg/dL — ABNORMAL HIGH (ref 70–99)
Potassium: 2.7 mmol/L — CL (ref 3.5–5.1)
Sodium: 142 mmol/L (ref 135–145)
Total Bilirubin: 0.8 mg/dL (ref 0.3–1.2)
Total Protein: 7.2 g/dL (ref 6.5–8.1)

## 2020-06-10 LAB — CBC WITH DIFFERENTIAL/PLATELET
Abs Immature Granulocytes: 0.01 10*3/uL (ref 0.00–0.07)
Basophils Absolute: 0 10*3/uL (ref 0.0–0.1)
Basophils Relative: 0 %
Eosinophils Absolute: 0 10*3/uL (ref 0.0–0.5)
Eosinophils Relative: 0 %
HCT: 40.6 % (ref 36.0–46.0)
Hemoglobin: 13.9 g/dL (ref 12.0–15.0)
Immature Granulocytes: 1 %
Lymphocytes Relative: 50 %
Lymphs Abs: 0.8 10*3/uL (ref 0.7–4.0)
MCH: 27.6 pg (ref 26.0–34.0)
MCHC: 34.2 g/dL (ref 30.0–36.0)
MCV: 80.6 fL (ref 80.0–100.0)
Monocytes Absolute: 0.3 10*3/uL (ref 0.1–1.0)
Monocytes Relative: 16 %
Neutro Abs: 0.5 10*3/uL — ABNORMAL LOW (ref 1.7–7.7)
Neutrophils Relative %: 33 %
Platelets: 175 10*3/uL (ref 150–400)
RBC: 5.04 MIL/uL (ref 3.87–5.11)
RDW: 16.3 % — ABNORMAL HIGH (ref 11.5–15.5)
WBC: 1.7 10*3/uL — ABNORMAL LOW (ref 4.0–10.5)
nRBC: 0 % (ref 0.0–0.2)

## 2020-06-10 LAB — MAGNESIUM: Magnesium: 1.8 mg/dL (ref 1.7–2.4)

## 2020-06-10 LAB — SAMPLE TO BLOOD BANK

## 2020-06-10 MED ORDER — HYDROCORTISONE ACETATE 25 MG RE SUPP: 25.0000 mg | Freq: Two times a day (BID) | RECTAL | 0 refills | Status: DC

## 2020-06-10 MED ORDER — SODIUM CHLORIDE 0.9 % IV SOLN: 20.0000 meq | Freq: Once | INTRAVENOUS | Status: DC

## 2020-06-10 MED ORDER — POTASSIUM CHLORIDE IN NACL 20-0.9 MEQ/L-% IV SOLN
Freq: Once | INTRAVENOUS | Status: AC
  Filled 2020-06-10: qty 1000

## 2020-06-10 MED ORDER — POTASSIUM CHLORIDE CRYS ER 20 MEQ PO TBCR: 20.0000 meq | EXTENDED_RELEASE_TABLET | Freq: Two times a day (BID) | ORAL | 0 refills | Status: DC

## 2020-06-10 MED ORDER — AZACITIDINE CHEMO SQ INJECTION
75.0000 mg/m2 | Freq: Once | INTRAMUSCULAR | Status: AC
  Administered 2020-06-10: 130 mg via SUBCUTANEOUS
  Filled 2020-06-10: qty 5.2

## 2020-06-10 MED ORDER — ONDANSETRON HCL 4 MG PO TABS
8.0000 mg | ORAL_TABLET | Freq: Once | ORAL | Status: AC
  Administered 2020-06-10: 8 mg via ORAL
  Filled 2020-06-10: qty 2

## 2020-06-10 MED ORDER — ONDANSETRON HCL 8 MG PO TABS: 8.0000 mg | ORAL_TABLET | Freq: Three times a day (TID) | ORAL | 0 refills | Status: DC | PRN

## 2020-06-10 NOTE — Progress Notes (Signed)
Pt received vidaza injection and IV K in clinic today. Tolerated well. No complaints at d/c.

## 2020-06-10 NOTE — Telephone Encounter (Signed)
Call placed to patient to discuss questions she had regarding home medications. Pt reported to infusion nurse today that she had questions regarding restarting oral chemotherapy-venetoclax. Per Rulon Abide, NP patient is to restart venetoclax today and take along with vidaza. Patient also has new prescriptions to pick up for lomotil and potassium supplement. Left vm for patient to return call to discuss medication concerns.

## 2020-06-10 NOTE — Progress Notes (Signed)
[place]  [phone] [phone]  ID: Debra Hale OB: [birth year]  MR#: [id number]  IOX#:[id number]  Patient Care Team: [name], MD as PCP - General (Internal Medicine) Wellington Hampshire, MD as PCP - Cardiology (Cardiology)  CHIEF COMPLAINT: AML.  INTERVAL HISTORY: Patient returns to clinic today for further follow-up.  She was last seen in clinic on 04/11/2020.  She had a bone marrow biopsy on 04/16/2020.  She received her first dose of Vidaza at [facility] on 06/03/2020 + venetoclax.  Per treatment plan she is scheduled to receive Vidaza  D1-7 q 28 days.     Today she returns for additional treatment locally.  She continues to feel tired and sleeps a lot.  Has been having diarrhea and is taking Imodium which seems to help.  On a good day she will have 2-3 episodes of diarrhea and on a bad she will have greater than 6.  She has been trying to stay hydrated but states "it is hard to drink fluids when you sleep so much".  Endorses a decrease in her appetite.  Has occasional nausea but no vomiting.  She is currently on Levaquin for prophylaxis of neutropenia.  Venetoclax is currently on hold.   REVIEW OF SYSTEMS:   Review of Systems  Constitutional: Positive for malaise/fatigue. Negative for chills, fever and weight loss.  HENT: Negative for congestion, ear pain and tinnitus.   Eyes: Negative.  Negative for blurred vision and double vision.  Respiratory: Negative.  Negative for cough, sputum production and shortness of breath.   Cardiovascular: Negative.  Negative for chest pain, palpitations and leg swelling.  Gastrointestinal: Positive for diarrhea and nausea. Negative for abdominal pain, constipation and vomiting.  Genitourinary: Negative for dysuria, frequency and urgency.  Musculoskeletal: Positive for myalgias. Negative for back pain and falls.  Skin: Negative.  Negative for rash.  Neurological: Positive for weakness. Negative for  headaches.  Endo/Heme/Allergies: Negative.  Does not bruise/bleed easily.  Psychiatric/Behavioral: Negative.  Negative for depression. The patient is not nervous/anxious and does not have insomnia.     As per HPI. Otherwise, a complete review of systems is negative.  PAST MEDICAL HISTORY: Past Medical History:  Diagnosis Date  . Anxiety   . Arthritis    Osteoarthritis  . Breast cancer ([place]) 2009   right breast lumpectomy with rad tx  . Colon cancer ([place])    surgery with chemo and rad tx  . Complication of anesthesia   . GERD (gastroesophageal reflux disease)   . Heart palpitations   . History of hiatal hernia   . History of kidney stones   . Hypothyroidism   . Liver disease   . Liver nodule    s/p negative biopsy  . Malignant neoplasm of thyroid gland ([place]) 2002   s/p surgery and XRT  . Osteoporosis   . Other and unspecified hyperlipidemia   . Palpitations   . Personal history of chemotherapy   . Personal history of malignant neoplasm of large intestine    carcinoma - cecum, s/p right laparoscopic colectomy - s/p chemotherapy and XRT  . Personal history of radiation therapy   . Pneumonia 2019  . PONV (postoperative nausea and vomiting)   . Pure hypercholesterolemia   . Unspecified hereditary and idiopathic peripheral neuropathy     PAST SURGICAL HISTORY: Past Surgical History:  Procedure Laterality Date  . APPENDECTOMY  1985  . BREAST BIOPSY Right 2009   positive  . BREAST BIOPSY Right 2009  negative  . BREAST LUMPECTOMY Right 2009   breast cancer  . CHOLECYSTECTOMY  1995  . COLONOSCOPY    . COLONOSCOPY WITH PROPOFOL N/A 10/29/2017   Procedure: COLONOSCOPY WITH PROPOFOL;  Surgeon: Manya Silvas, MD;  Location: [facility] ENDOSCOPY;  Service: Endoscopy;  Laterality: N/A;  . DILATION AND CURETTAGE OF UTERUS  1990  . DILATION AND CURETTAGE, DIAGNOSTIC / THERAPEUTIC  1990  . ESOPHAGOGASTRODUODENOSCOPY    . ESOPHAGOGASTRODUODENOSCOPY (EGD) WITH PROPOFOL N/A  10/29/2017   Procedure: ESOPHAGOGASTRODUODENOSCOPY (EGD) WITH PROPOFOL;  Surgeon: Manya Silvas, MD;  Location: [facility] ENDOSCOPY;  Service: Endoscopy;  Laterality: N/A;  . LAPAROSCOPIC PARTIAL COLECTOMY     stage 3-C carcinoma of the cecum, s/p chemotherapy and xrt  . LITHOTRIPSY    . SIGMOIDOSCOPY  08/26/1993  . THYROID LOBECTOMY  2002   s/p XRT  . TOTAL HIP ARTHROPLASTY Right 10/24/2019   Procedure: TOTAL HIP ARTHROPLASTY;  Surgeon: Corky Mull, MD;  Location: [facility] ORS;  Service: Orthopedics;  Laterality: Right;    FAMILY HISTORY: Family History  Problem Relation Age of Onset  . Stroke Mother        87s  . Alzheimer's disease Mother   . Cancer Mother   . Lung cancer Father   . Prostate cancer Father   . Cancer Father        Colon  . Colon cancer Father   . Breast cancer Sister        8's  . Lung cancer Sister   . Breast cancer Maternal Aunt     ADVANCED DIRECTIVES (Y/N):  N  HEALTH MAINTENANCE: Social History   Tobacco Use  . Smoking status: Never Smoker  . Smokeless tobacco: Never Used  Vaping Use  . Vaping Use: Never used  Substance Use Topics  . Alcohol use: No    Alcohol/week: 0.0 standard drinks  . Drug use: No     Colonoscopy:  PAP:  Bone density:  Lipid panel:  Allergies  Allergen Reactions  . Demeclocycline Other (See Comments)    Throat swells  . Tetracyclines & Related Other (See Comments)    Throat swells   . Augmentin [Amoxicillin-Pot Clavulanate] Diarrhea  . Bentyl [Dicyclomine Hcl]     unkn  . Ciprofloxacin Diarrhea  . Codeine Other (See Comments)    dizziness    . Epinephrine     Speeds heart up - panic   . Flagyl [Metronidazole] Nausea And Vomiting  . Librax [Chlordiazepoxide-Clidinium]     unkn  . Novocain [Procaine] Other (See Comments)    "Shaky"  . Phenobarbital     unkn  . Prednisone     Nervous    . Ultram [Tramadol] Other (See Comments)    Sick feeling    Current Outpatient Medications  Medication Sig  Dispense Refill  . Azelastine-Fluticasone (DYMISTA) 137-50 MCG/ACT SUSP 2 sprays each nostril q day 23 g 2  . clonazePAM (KLONOPIN) 0.5 MG tablet Take 1.5 mg by mouth 2 (two) times daily.   0  . escitalopram (LEXAPRO) 20 MG tablet Take 20 mg by mouth every morning.     Marland Kitchen levofloxacin (LEVAQUIN) 500 MG tablet Take by mouth.    . levothyroxine (SYNTHROID) 125 MCG tablet TAKE 1 TABLET BY MOUTH EVERY DAY 90 tablet 1  . loperamide (IMODIUM) 2 MG capsule Take by mouth.    . magnesium oxide (MAG-OX) 400 MG tablet TAKE 1 TABLET BY MOUTH EVERY DAY 90 tablet 1  . pantoprazole (PROTONIX) 40 MG tablet  TAKE 1 TABLET BY MOUTH EVERY DAY 90 tablet 2  . potassium chloride (KLOR-CON) 10 MEQ tablet TAKE 1 TABLET BY MOUTH EVERY DAY 90 tablet 1  . valACYclovir (VALTREX) 500 MG tablet Take by mouth.     Current Facility-Administered Medications  Medication Dose Route Frequency Provider Last Rate Last Admin  . ondansetron (ZOFRAN) tablet 8 mg  8 mg Oral Once Guse, Jacquelynn Cree, FNP        OBJECTIVE: There were no vitals filed for this visit.   There is no height or weight on file to calculate BMI.    ECOG FS:0 - Asymptomatic  Physical Exam Constitutional:      General: Vital signs are normal.     Appearance: Normal appearance.  HENT:     Head: Normocephalic and atraumatic.  Eyes:     Pupils: Pupils are equal, round, and reactive to light.  Cardiovascular:     Rate and Rhythm: Normal rate and regular rhythm.     Heart sounds: Normal heart sounds. No murmur heard.   Pulmonary:     Effort: Pulmonary effort is normal.     Breath sounds: Normal breath sounds. No wheezing.  Abdominal:     General: Bowel sounds are normal. There is no distension.     Palpations: Abdomen is soft.     Tenderness: There is no abdominal tenderness.  Musculoskeletal:        General: No edema. Normal range of motion.     Cervical back: Normal range of motion.  Skin:    General: Skin is warm and dry.     Findings: No rash.   Neurological:     Mental Status: She is alert and oriented to person, place, and time.  Psychiatric:        Judgment: Judgment normal.      LAB RESULTS:  Lab Results  Component Value Date   NA 143 02/20/2020   K 3.3 (L) 04/02/2020   CL 105 02/20/2020   CO2 29 02/20/2020   GLUCOSE 98 02/20/2020   BUN 13 02/20/2020   CREATININE 0.98 02/20/2020   CALCIUM 8.9 02/20/2020   PROT 7.1 02/20/2020   ALBUMIN 3.9 02/20/2020   AST 12 02/20/2020   ALT 20 02/20/2020   ALKPHOS 111 02/20/2020   BILITOT 0.7 02/20/2020   GFRNONAA 47 (L) 10/26/2019   GFRAA 54 (L) 10/26/2019    Lab Results  Component Value Date   WBC 1.7 (L) 06/10/2020   NEUTROABS 0.5 (L) 06/10/2020   HGB 13.9 06/10/2020   HCT 40.6 06/10/2020   MCV 80.6 06/10/2020   PLT 175 06/10/2020     STUDIES: No results found.  ASSESSMENT: AML.  PLAN:    1.  AML:  -Status post induction treatment at [facility]. -Started treatment with venetoclax and Vidaza on 05/02/20 as an inpatient secondary to transportation to and from [facility].. -She received ramp-up treatment from 05/03/2020-05/09/20 as inpatient and did well.  -Repeat bone marrow at [facility] consistent with disease remission. -Cycle 2 venetoclax and Vidaza will be given here locally. -Today lab work appears stable.  ANC is 0.5, platelet count 175 and hemoglobin 13.9. -Spoke with Dr. [name] who recommends we proceed with treatment today. -She is currently on Levaquin for prophylaxis. -She will return to clinic daily for Vidaza x7 days. -She will restart her venetoclax today as well for 7 days. -We will recheck labs in 1 week.   2.  Hypokalemia: -Secondary to diarrhea and dehydration -Proceed with 20 mEq IV potassium  today. -Prescription called in for 20 mEq p.o. twice daily x1 week. -Continue Imodium for diarrhea. -Encouraged hydration.  3. 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 -Received chemo with Adriamycin-unknown exact regimen. -Completed 5 years of hormonal therapy in June 2015. -Most recent mammogram from November 2021 was reported as BI-RADS 1. -Repeat mammogram in 2022.  4.  History of colon cancer:  -Patient also states that she received chemotherapy for this, possibly FOLFOX but again this is unknown.  5. Nausea: -She is experiencing some mild nausea. -We will refill nausea medicine-Zofran 8 mg every 8 hours as needed.  Disposition: -RTC daily for Vidaza X 7 days -Start venetoclax today -RTC in 1 week for repeat labs and to assess tolerance. -Continue Levaquin.  Patient expressed understanding and was in agreement with this plan. She also understands that She can call clinic at any time with any questions, concerns, or complaints.   Greater than 50% was spent in counseling and coordination of care with this patient including but not limited to discussion of the relevant topics above (See A&P) including, but not limited to diagnosis and management of acute and chronic medical conditions.    Cancer Staging History of breast cancer Staging form: Breast, AJCC 7th Edition - Clinical stage from 01/07/2016: Stage IA (T1c, N0, M0) - Signed by Lloyd Huger, MD on 01/07/2016 Laterality: Right Estrogen receptor status: Positive Progesterone receptor status: Positive HER2 status: Negative   Jacquelin Hawking, NP   06/10/2020 9:27 AM

## 2020-06-10 NOTE — Progress Notes (Signed)
Patient here today for follow up regarding AML, continuation of treatment. Patient reports some nausea, decreased appetite.

## 2020-06-10 NOTE — Telephone Encounter (Signed)
Medications updated

## 2020-06-10 NOTE — Addendum Note (Signed)
Addended by: Faythe Casa E on: 06/10/2020 03:37 PM   Modules accepted: Orders

## 2020-06-11 ENCOUNTER — Inpatient Hospital Stay: Payer: PPO

## 2020-06-11 ENCOUNTER — Telehealth: Payer: Self-pay | Admitting: *Deleted

## 2020-06-11 ENCOUNTER — Other Ambulatory Visit: Payer: Self-pay | Admitting: *Deleted

## 2020-06-11 VITALS — BP 135/78 | HR 87 | Temp 97.1°F | Resp 16

## 2020-06-11 DIAGNOSIS — Z5111 Encounter for antineoplastic chemotherapy: Secondary | ICD-10-CM | POA: Diagnosis not present

## 2020-06-11 DIAGNOSIS — C9200 Acute myeloblastic leukemia, not having achieved remission: Secondary | ICD-10-CM

## 2020-06-11 MED ORDER — AZACITIDINE CHEMO SQ INJECTION
75.0000 mg/m2 | Freq: Once | INTRAMUSCULAR | Status: AC
  Administered 2020-06-11: 130 mg via SUBCUTANEOUS
  Filled 2020-06-11: qty 5.2

## 2020-06-11 MED ORDER — ONDANSETRON HCL 4 MG PO TABS
8.0000 mg | ORAL_TABLET | Freq: Once | ORAL | Status: AC
  Administered 2020-06-11: 8 mg via ORAL
  Filled 2020-06-11: qty 2

## 2020-06-11 MED ORDER — DIPHENOXYLATE-ATROPINE 2.5-0.025 MG PO TABS: 1.0000 | ORAL_TABLET | Freq: Four times a day (QID) | ORAL | 0 refills | Status: DC | PRN

## 2020-06-11 MED ORDER — DIPHENOXYLATE-ATROPINE 2.5-0.025 MG PO TABS
1.0000 | ORAL_TABLET | Freq: Once | ORAL | Status: AC
  Administered 2020-06-11: 1 via ORAL
  Filled 2020-06-11: qty 1

## 2020-06-11 NOTE — Telephone Encounter (Signed)
RN called pt and instructed lomotil prescription had been called into [facility] [place], [place].  Pt verbalized understanding and stated she would pick medication up as soon as possible.

## 2020-06-11 NOTE — Progress Notes (Signed)
Patient with continuing diarrhea yesterday and today. New orders received for Lomotil x 1 - given per orders, along with ordered Vidaza SQ. Discharged home in stable condition, and will pick up rx for Lomotil.

## 2020-06-11 NOTE — Telephone Encounter (Signed)
RN called lomotil into [facility] [address] in [place].

## 2020-06-12 ENCOUNTER — Inpatient Hospital Stay: Payer: PPO

## 2020-06-12 VITALS — BP 119/73 | HR 78 | Temp 98.1°F | Resp 16

## 2020-06-12 DIAGNOSIS — C9200 Acute myeloblastic leukemia, not having achieved remission: Secondary | ICD-10-CM

## 2020-06-12 DIAGNOSIS — Z5111 Encounter for antineoplastic chemotherapy: Secondary | ICD-10-CM | POA: Diagnosis not present

## 2020-06-12 MED ORDER — ONDANSETRON HCL 4 MG PO TABS
8.0000 mg | ORAL_TABLET | Freq: Once | ORAL | Status: AC
  Administered 2020-06-12: 8 mg via ORAL
  Filled 2020-06-12: qty 2

## 2020-06-12 MED ORDER — AZACITIDINE CHEMO SQ INJECTION
75.0000 mg/m2 | Freq: Once | INTRAMUSCULAR | Status: AC
  Administered 2020-06-12: 130 mg via SUBCUTANEOUS
  Filled 2020-06-12: qty 5.2

## 2020-06-12 NOTE — Progress Notes (Signed)
Patient tolerated chemo injections well, no concerns voiced. Patient discharged. Stable.

## 2020-06-13 ENCOUNTER — Inpatient Hospital Stay: Payer: PPO

## 2020-06-13 ENCOUNTER — Other Ambulatory Visit: Payer: Self-pay | Admitting: Oncology

## 2020-06-13 VITALS — BP 114/62 | HR 76 | Temp 97.9°F | Resp 20

## 2020-06-13 DIAGNOSIS — C9200 Acute myeloblastic leukemia, not having achieved remission: Secondary | ICD-10-CM

## 2020-06-13 DIAGNOSIS — Z5111 Encounter for antineoplastic chemotherapy: Secondary | ICD-10-CM | POA: Diagnosis not present

## 2020-06-13 MED ORDER — AZACITIDINE CHEMO SQ INJECTION
75.0000 mg/m2 | Freq: Once | INTRAMUSCULAR | Status: AC
  Administered 2020-06-13: 130 mg via SUBCUTANEOUS
  Filled 2020-06-13: qty 5.2

## 2020-06-13 MED ORDER — ONDANSETRON HCL 4 MG PO TABS
8.0000 mg | ORAL_TABLET | Freq: Once | ORAL | Status: AC
  Administered 2020-06-13: 8 mg via ORAL
  Filled 2020-06-13: qty 2

## 2020-06-13 NOTE — Progress Notes (Signed)
1502- Patient tolerated treatment well. Patient stable and discharged to home at this time.

## 2020-06-14 ENCOUNTER — Inpatient Hospital Stay: Payer: PPO

## 2020-06-14 ENCOUNTER — Inpatient Hospital Stay (HOSPITAL_BASED_OUTPATIENT_CLINIC_OR_DEPARTMENT_OTHER): Payer: PPO | Admitting: Oncology

## 2020-06-14 ENCOUNTER — Encounter: Payer: Self-pay | Admitting: Oncology

## 2020-06-14 VITALS — BP 119/68 | HR 82 | Temp 97.4°F | Resp 20 | Wt 132.5 lb

## 2020-06-14 DIAGNOSIS — K6289 Other specified diseases of anus and rectum: Secondary | ICD-10-CM

## 2020-06-14 DIAGNOSIS — C9200 Acute myeloblastic leukemia, not having achieved remission: Secondary | ICD-10-CM

## 2020-06-14 DIAGNOSIS — Z5111 Encounter for antineoplastic chemotherapy: Secondary | ICD-10-CM | POA: Diagnosis not present

## 2020-06-14 LAB — COMPREHENSIVE METABOLIC PANEL
ALT: 19 U/L (ref 0–44)
AST: 12 U/L — ABNORMAL LOW (ref 15–41)
Albumin: 3.4 g/dL — ABNORMAL LOW (ref 3.5–5.0)
Alkaline Phosphatase: 99 U/L (ref 38–126)
Anion gap: 8 (ref 5–15)
BUN: 10 mg/dL (ref 8–23)
CO2: 27 mmol/L (ref 22–32)
Calcium: 9 mg/dL (ref 8.9–10.3)
Chloride: 104 mmol/L (ref 98–111)
Creatinine, Ser: 0.98 mg/dL (ref 0.44–1.00)
GFR, Estimated: >60 mL/min (ref >60)
Glucose, Bld: 114 mg/dL — ABNORMAL HIGH (ref 70–99)
Potassium: 3.4 mmol/L — ABNORMAL LOW (ref 3.5–5.1)
Sodium: 139 mmol/L (ref 135–145)
Total Bilirubin: 1 mg/dL (ref 0.3–1.2)
Total Protein: 6.7 g/dL (ref 6.5–8.1)

## 2020-06-14 LAB — CBC WITH DIFFERENTIAL/PLATELET
Abs Immature Granulocytes: 0.01 10*3/uL (ref 0.00–0.07)
Basophils Absolute: 0 10*3/uL (ref 0.0–0.1)
Basophils Relative: 0 %
Eosinophils Absolute: 0 10*3/uL (ref 0.0–0.5)
Eosinophils Relative: 0 %
HCT: 37 % (ref 36.0–46.0)
Hemoglobin: 12.5 g/dL (ref 12.0–15.0)
Immature Granulocytes: 0 %
Lymphocytes Relative: 14 %
Lymphs Abs: 0.4 10*3/uL — ABNORMAL LOW (ref 0.7–4.0)
MCH: 27.9 pg (ref 26.0–34.0)
MCHC: 33.8 g/dL (ref 30.0–36.0)
MCV: 82.6 fL (ref 80.0–100.0)
Monocytes Absolute: 0.3 10*3/uL (ref 0.1–1.0)
Monocytes Relative: 10 %
Neutro Abs: 2.1 10*3/uL (ref 1.7–7.7)
Neutrophils Relative %: 76 %
Platelets: 201 10*3/uL (ref 150–400)
RBC: 4.48 MIL/uL (ref 3.87–5.11)
RDW: 16.6 % — ABNORMAL HIGH (ref 11.5–15.5)
WBC: 2.8 10*3/uL — ABNORMAL LOW (ref 4.0–10.5)
nRBC: 0 % (ref 0.0–0.2)

## 2020-06-14 LAB — MAGNESIUM: Magnesium: 1.9 mg/dL (ref 1.7–2.4)

## 2020-06-14 MED ORDER — SITZ BATH MISC: 10.0000 | Freq: Four times a day (QID) | 1 refills | Status: DC

## 2020-06-14 MED ORDER — ONDANSETRON HCL 4 MG PO TABS
8.0000 mg | ORAL_TABLET | Freq: Once | ORAL | Status: DC
  Filled 2020-06-14: qty 2

## 2020-06-14 MED ORDER — SODIUM CHLORIDE 0.9 % IV SOLN
Freq: Once | INTRAVENOUS | Status: AC
  Filled 2020-06-14: qty 250

## 2020-06-14 MED ORDER — AZACITIDINE CHEMO SQ INJECTION
75.0000 mg/m2 | Freq: Once | INTRAMUSCULAR | Status: AC
  Administered 2020-06-14: 130 mg via SUBCUTANEOUS
  Filled 2020-06-14: qty 5.2

## 2020-06-14 NOTE — Progress Notes (Signed)
Symptom Management Consult note [facility]  [phone] [phone]  Patient Care Team: [name], MD as PCP - General (Internal Medicine) Wellington Hampshire, MD as PCP - Cardiology (Cardiology)   Name of the patient: Debra Hale  [id number]  [birth year]   Date of visit: 06/14/2020   Diagnosis-AML  Chief complaint/ Reason for visit-hemorrhoid pain  Heme/Onc history: Mrs. Wyche is a 70 year old female with past medical history significant for GERD, thrush, rectal bleed, hypothyroidism, peripheral neuropathy, osteoporosis who was recently diagnosed with AML by [facility].  She started treatment with venetoclax and Vidaza on 05/02/2020 as an inpatient secondary to transportation to and from [facility].  She received ramp-up treatment from 05/03/2020-05/09/2020.  Repeat bone marrow consistent with disease remission.  Cycle 2 venetoclax and Vidaza given at [facility] from 06/10/20-06/14/20.   She continues to be neutropenic and was started on Levaquin prophylactically.  Interval history-today, patient presents with painful hemorrhoids.  Pain started on Sunday or Monday prior to her treatment. Has history of discomfort with hemorrhoids which normally resolve with OTC medications. Denies any rectal bleeding. She denies any constipation but admits to diarrhea.  She has been taking Imodium as needed. She has tried Preparation H. Has tried one Anusol suppository d/t inability to open packaging. States the pain is so bad she is unable to sit on her bottom for long periods of time. Denies fevers. Denies abdominal pain.   ECOG FS:1 - Symptomatic but completely ambulatory  Review of systems- Review of Systems  Constitutional: Positive for malaise/fatigue. Negative for chills, fever and weight loss.  HENT: Negative for congestion, ear pain and tinnitus.   Eyes: Negative.  Negative for blurred vision and double vision.  Respiratory: Negative.  Negative for cough, sputum  production and shortness of breath.   Cardiovascular: Negative.  Negative for chest pain, palpitations and leg swelling.  Gastrointestinal: Negative.  Negative for abdominal pain, constipation, diarrhea, nausea and vomiting.       Possible hemorrhoid  Genitourinary: Negative for dysuria, frequency and urgency.  Musculoskeletal: Negative for back pain and falls.  Skin: Negative.  Negative for rash.  Neurological: Negative.  Negative for weakness and headaches.  Endo/Heme/Allergies: Negative.  Does not bruise/bleed easily.  Psychiatric/Behavioral: Negative.  Negative for depression. The patient is not nervous/anxious and does not have insomnia.      Current treatment-Vidaza plus venetoclax status post 2 cycles  Allergies  Allergen Reactions  . Demeclocycline Other (See Comments)    Throat swells  . Tetracyclines & Related Other (See Comments)    Throat swells   . Augmentin [Amoxicillin-Pot Clavulanate] Diarrhea  . Bentyl [Dicyclomine Hcl]     unkn  . Ciprofloxacin Diarrhea  . Codeine Other (See Comments)    dizziness    . Epinephrine     Speeds heart up - panic   . Flagyl [Metronidazole] Nausea And Vomiting  . Librax [Chlordiazepoxide-Clidinium]     unkn  . Novocain [Procaine] Other (See Comments)    "Shaky"  . Phenobarbital     unkn  . Prednisone     Nervous    . Ultram [Tramadol] Other (See Comments)    Sick feeling     Past Medical History:  Diagnosis Date  . Anxiety   . Arthritis    Osteoarthritis  . Breast cancer ([place]) 2009   right breast lumpectomy with rad tx  . Colon cancer ([place])    surgery with chemo and rad tx  . Complication of anesthesia   .  GERD (gastroesophageal reflux disease)   . Heart palpitations   . History of hiatal hernia   . History of kidney stones   . Hypothyroidism   . Liver disease   . Liver nodule    s/p negative biopsy  . Malignant neoplasm of thyroid gland ([place]) 2002   s/p surgery and XRT  . Osteoporosis   . Other and  unspecified hyperlipidemia   . Palpitations   . Personal history of chemotherapy   . Personal history of malignant neoplasm of large intestine    carcinoma - cecum, s/p right laparoscopic colectomy - s/p chemotherapy and XRT  . Personal history of radiation therapy   . Pneumonia 2019  . PONV (postoperative nausea and vomiting)   . Pure hypercholesterolemia   . Unspecified hereditary and idiopathic peripheral neuropathy      Past Surgical History:  Procedure Laterality Date  . APPENDECTOMY  1985  . BREAST BIOPSY Right 2009   positive  . BREAST BIOPSY Right 2009   negative  . BREAST LUMPECTOMY Right 2009   breast cancer  . CHOLECYSTECTOMY  1995  . COLONOSCOPY    . COLONOSCOPY WITH PROPOFOL N/A 10/29/2017   Procedure: COLONOSCOPY WITH PROPOFOL;  Surgeon: Manya Silvas, MD;  Location: [facility] ENDOSCOPY;  Service: Endoscopy;  Laterality: N/A;  . DILATION AND CURETTAGE OF UTERUS  1990  . DILATION AND CURETTAGE, DIAGNOSTIC / THERAPEUTIC  1990  . ESOPHAGOGASTRODUODENOSCOPY    . ESOPHAGOGASTRODUODENOSCOPY (EGD) WITH PROPOFOL N/A 10/29/2017   Procedure: ESOPHAGOGASTRODUODENOSCOPY (EGD) WITH PROPOFOL;  Surgeon: Manya Silvas, MD;  Location: [facility] ENDOSCOPY;  Service: Endoscopy;  Laterality: N/A;  . LAPAROSCOPIC PARTIAL COLECTOMY     stage 3-C carcinoma of the cecum, s/p chemotherapy and xrt  . LITHOTRIPSY    . SIGMOIDOSCOPY  08/26/1993  . THYROID LOBECTOMY  2002   s/p XRT  . TOTAL HIP ARTHROPLASTY Right 10/24/2019   Procedure: TOTAL HIP ARTHROPLASTY;  Surgeon: Corky Mull, MD;  Location: [facility] ORS;  Service: Orthopedics;  Laterality: Right;    Social History   Socioeconomic History  . Marital status: Single    Spouse name: Not on file  . Number of children: 0  . Years of education: Not on file  . Highest education level: Not on file  Occupational History  . Not on file  Tobacco Use  . Smoking status: Never Smoker  . Smokeless tobacco: Never Used  Vaping Use  . Vaping  Use: Never used  Substance and Sexual Activity  . Alcohol use: No    Alcohol/week: 0.0 standard drinks  . Drug use: No  . Sexual activity: Not Currently  Other Topics Concern  . Not on file  Social History Narrative  . Not on file   Social Determinants of Health   Financial Resource Strain: Not on file  Food Insecurity: Not on file  Transportation Needs: Not on file  Physical Activity: Not on file  Stress: Not on file  Social Connections: Not on file  Intimate Partner Violence: Not on file    Family History  Problem Relation Age of Onset  . Stroke Mother        68s  . Alzheimer's disease Mother   . Cancer Mother   . Lung cancer Father   . Prostate cancer Father   . Cancer Father        Colon  . Colon cancer Father   . Breast cancer Sister        22's  . Lung cancer  Sister   . Breast cancer Maternal Aunt      Current Facility-Administered Medications:  .  ondansetron (ZOFRAN) tablet 8 mg, 8 mg, Oral, Once, Guse, Jacquelynn Cree, FNP  Current Outpatient Medications:  .  Azelastine-Fluticasone (DYMISTA) 137-50 MCG/ACT SUSP, 2 sprays each nostril q day, Disp: 23 g, Rfl: 2 .  clonazePAM (KLONOPIN) 0.5 MG tablet, Take 1.5 mg by mouth 2 (two) times daily. , Disp: , Rfl: 0 .  diphenoxylate-atropine (LOMOTIL) 2.5-0.025 MG tablet, Take 1 tablet by mouth 4 (four) times daily as needed for diarrhea or loose stools., Disp: 30 tablet, Rfl: 0 .  escitalopram (LEXAPRO) 20 MG tablet, Take 20 mg by mouth every morning. , Disp: , Rfl:  .  hydrocortisone (ANUSOL-HC) 25 MG suppository, Place 1 suppository (25 mg total) rectally 2 (two) times daily., Disp: 12 suppository, Rfl: 0 .  levofloxacin (LEVAQUIN) 500 MG tablet, Take 500 mg by mouth daily., Disp: , Rfl:  .  levothyroxine (SYNTHROID) 125 MCG tablet, TAKE 1 TABLET BY MOUTH EVERY DAY, Disp: 90 tablet, Rfl: 1 .  loperamide (IMODIUM) 2 MG capsule, Take 2 mg by mouth as needed., Disp: , Rfl:  .  magnesium oxide (MAG-OX) 400 MG tablet, TAKE 1  TABLET BY MOUTH EVERY DAY, Disp: 90 tablet, Rfl: 1 .  Misc. Devices (SITZ BATH) MISC, 10 Packages by Does not apply route in the morning, at noon, in the evening, and at bedtime., Disp: 10 each, Rfl: 1 .  ondansetron (ZOFRAN) 8 MG tablet, Take 1 tablet (8 mg total) by mouth every 8 (eight) hours as needed for nausea or vomiting., Disp: 20 tablet, Rfl: 0 .  pantoprazole (PROTONIX) 40 MG tablet, TAKE 1 TABLET BY MOUTH EVERY DAY, Disp: 90 tablet, Rfl: 2 .  potassium chloride SA (KLOR-CON) 20 MEQ tablet, Take 1 tablet (20 mEq total) by mouth 2 (two) times daily., Disp: 21 tablet, Rfl: 0 .  valACYclovir (VALTREX) 500 MG tablet, Take 500 mg by mouth daily., Disp: , Rfl:  .  venetoclax (VENCLEXTA) 100 MG tablet, Take 100 mg by mouth daily. Tablets should be swallowed whole with a meal and a full glass of water., Disp: , Rfl:   Facility-Administered Medications Ordered in Other Visits:  .  acetaminophen (TYLENOL) tablet 1,000 mg, 1,000 mg, Oral, Q6H, Pabon, Diego F, MD, 1,000 mg at 06/17/20 0028 .  clonazePAM (KLONOPIN) disintegrating tablet 1 mg, 1 mg, Oral, TID PRN, Pabon, Diego F, MD .  clonazePAM (KLONOPIN) tablet 1.5 mg, 1.5 mg, Oral, BID, Mansy, Jan A, MD .  dextrose 5 %-0.45 % sodium chloride infusion, , Intravenous, Continuous, Pabon, [place] F, MD, Last Rate: 125 mL/hr at 06/17/20 0049, New Bag at 06/17/20 0049 .  diphenhydrAMINE (BENADRYL) 12.5 MG/5ML elixir 12.5 mg, 12.5 mg, Oral, Q6H PRN **OR** diphenhydrAMINE (BENADRYL) injection 12.5 mg, 12.5 mg, Intravenous, Q6H PRN, Pabon, Diego F, MD .  diphenoxylate-atropine (LOMOTIL) 2.5-0.025 MG per tablet 1 tablet, 1 tablet, Oral, QID PRN, Mansy, Jan A, MD .  escitalopram (LEXAPRO) tablet 20 mg, 20 mg, Oral, BH-q7a, Mansy, Jan A, MD .  heparin injection 5,000 Units, 5,000 Units, Subcutaneous, Q8H, Pabon, [place] F, MD, 5,000 Units at 06/17/20 0030 .  HYDROmorphone (DILAUDID) injection 0.5 mg, 0.5 mg, Intravenous, Q3H PRN, Pabon, Diego F, MD, 0.5 mg at  06/17/20 0037 .  ketorolac (TORADOL) 15 MG/ML injection 15 mg, 15 mg, Intravenous, Q6H PRN, Pabon, Diego F, MD .  levothyroxine (SYNTHROID) tablet 125 mcg, 125 mcg, Oral, Daily, Mansy, Arvella Merles, MD .  loperamide (IMODIUM) capsule 2 mg, 2 mg, Oral, PRN, Mansy, Jan A, MD .  magnesium oxide (MAG-OX) tablet 400 mg, 400 mg, Oral, Daily, Mansy, Jan A, MD .  metoprolol tartrate (LOPRESSOR) injection 5 mg, 5 mg, Intravenous, Q6H PRN, Pabon, Diego F, MD .  ondansetron (ZOFRAN-ODT) disintegrating tablet 4 mg, 4 mg, Oral, Q6H PRN **OR** ondansetron (ZOFRAN) injection 4 mg, 4 mg, Intravenous, Q6H PRN, Pabon, Diego F, MD .  oxyCODONE (Oxy IR/ROXICODONE) immediate release tablet 5-10 mg, 5-10 mg, Oral, Q4H PRN, Pabon, Diego F, MD .  pantoprazole (PROTONIX) injection 40 mg, 40 mg, Intravenous, Q12H, Pabon, Diego F, MD, 40 mg at 06/17/20 0023 .  piperacillin-tazobactam (ZOSYN) IVPB 3.375 g, 3.375 g, Intravenous, Q8H, Renda Rolls, RPH, Stopped at 06/17/20 0246 .  potassium chloride SA (KLOR-CON) CR tablet 20 mEq, 20 mEq, Oral, BID, Mansy, Jan A, MD, 20 mEq at 06/17/20 0024 .  prochlorperazine (COMPAZINE) tablet 10 mg, 10 mg, Oral, Q6H PRN **OR** prochlorperazine (COMPAZINE) injection 5-10 mg, 5-10 mg, Intravenous, Q6H PRN, Pabon, Diego F, MD .  traZODone (DESYREL) tablet 25 mg, 25 mg, Oral, QHS PRN, Mansy, Jan A, MD .  vancomycin (VANCOREADY) IVPB 1250 mg/250 mL, 1,250 mg, Intravenous, Q24H, Renda Rolls, RPH, Stopped at 06/17/20 0416 .  venetoclax (VENCLEXTA) tablet 100 mg, 100 mg, Oral, Daily, Mansy, Jan A, MD .  zolpidem (AMBIEN) tablet 5 mg, 5 mg, Oral, QHS PRN, Pabon, Diego F, MD  Physical exam:  Vitals:   06/14/20 1141  BP: 119/68  Pulse: 82  Resp: 20  Temp: (!) 97.4 F (36.3 C)  TempSrc: Tympanic  Weight: 132 lb 8 oz (60.1 kg)   Physical Exam Constitutional:      General: Vital signs are normal.     Appearance: Normal appearance.  HENT:     Head: Normocephalic and atraumatic.  Eyes:      Pupils: Pupils are equal, round, and reactive to light.  Cardiovascular:     Rate and Rhythm: Normal rate and regular rhythm.     Heart sounds: Normal heart sounds. No murmur heard.   Pulmonary:     Effort: Pulmonary effort is normal.     Breath sounds: Normal breath sounds. No wheezing.  Abdominal:     General: Bowel sounds are normal. There is no distension.     Palpations: Abdomen is soft.     Tenderness: There is no abdominal tenderness.  Genitourinary:    Rectum: Mass, tenderness and external hemorrhoid present.     Comments: Hemorrhoid verse abscess to right of rectum. Does not appear to be strangulated given color-red in color.  Musculoskeletal:        General: No edema. Normal range of motion.     Cervical back: Normal range of motion.  Skin:    General: Skin is warm and dry.     Findings: No rash.  Neurological:     Mental Status: She is alert and oriented to person, place, and time.  Psychiatric:        Judgment: Judgment normal.      CMP Latest Ref Rng & Units 06/16/2020  Glucose 70 - 99 mg/dL 112(H)  BUN 8 - 23 mg/dL 10  Creatinine 0.44 - 1.00 mg/dL 0.88  Sodium 135 - 145 mmol/L 140  Potassium 3.5 - 5.1 mmol/L 3.3(L)  Chloride 98 - 111 mmol/L 102  CO2 22 - 32 mmol/L 27  Calcium 8.9 - 10.3 mg/dL 9.1  Total Protein 6.5 - 8.1 g/dL 6.8  Total Bilirubin 0.3 - 1.2 mg/dL 0.6  Alkaline Phos 38 - 126 U/L 103  AST 15 - 41 U/L 10(L)  ALT 0 - 44 U/L 18   CBC Latest Ref Rng & Units 06/16/2020  WBC 4.0 - 10.5 K/uL 3.4(L)  Hemoglobin 12.0 - 15.0 g/dL 11.8(L)  Hematocrit 36.0 - 46.0 % 35.9(L)  Platelets 150 - 400 K/uL 237    No images are attached to the encounter.  CT PELVIS W CONTRAST  Result Date: 06/16/2020 CLINICAL DATA:  Abscess, anal or rectal Patient reports pain since Friday. EXAM: CT PELVIS WITH CONTRAST TECHNIQUE: Multidetector CT imaging of the pelvis was performed using the standard protocol following the bolus administration of intravenous contrast.  CONTRAST:  16mL OMNIPAQUE IOHEXOL 300 MG/ML  SOLN COMPARISON:  Abdominopelvic CT 07/21/2017 FINDINGS: Urinary Tract: Urinary bladder is unremarkable. No bladder wall thickening. Bowel: Thick-walled peripherally enhancing a perirectal collection measures 3.3 x 2.7 x 2.9 cm. This is just to the left of midline with adjacent inflammatory changes extending along the left gluteal crease. No internal or soft tissue air. No inflammation of pelvic bowel loops. Surgical anastomosis noted in the central abdomen. Vascular/Lymphatic: No acute vascular findings. No enlarged lymph nodes. Reproductive:  Quiescent uterus and ovaries.  No adnexal mass. Other:  No pelvic free fluid.  No inguinal hernia. Musculoskeletal: Right hip arthroplasty with subsequent streak artifact. Left hip osteoarthritis. No focal bone lesion or acute osseous abnormality. IMPRESSION: Perirectal abscess measuring 3.3 x 2.7 x 2.9 cm just to the left of midline. Inflammatory changes extend inferiorly to involve the left gluteal crease. Electronically Signed   By: Keith Rake M.D.   On: 06/16/2020 22:31     Assessment and plan- Patient is a 70 y.o. female who presents for 3-4 days of anal pain. No fevers.   1.  AML:  -Status post induction treatment at [facility]. -Started treatment with venetoclax and Vidaza on 05/02/20 as an inpatient secondary to transportation to and from [facility].. -She received ramp-up treatment from 05/03/2020-05/09/20 as inpatient and did well.  -Repeat bone marrow at [facility] consistent with disease remission. -Cycle 2 venetoclax and Vidaza given at [facility] from 06/10/20-06/14/20.  -She is currently on Levaquin for prophylaxis.  2. Anal Pain: -Unclear etiology-external hemorrhoid versus abscess.  Does not appear to be strangulated. -Is causing discomfort.  Tolerable if not sitting directly on her buttocks. -Has history of hemorrhoids.  No anal fissure or bleeding. -Has tried Anusol suppositories and Preparation H without  relief. -Spoke with GI who recommends sitz bath several times daily and to see them on Monday. -Other option would be to contact surgery but unsure if this is an option given ANC most recently was 500. -Keep appointment with Dr. [name] on Monday morning to see if symptoms are improving and will repeat labs and see GI first thing Monday morning.  Dehydration: -Patient has not been eating and drinking well secondary to nausea likely due to treatment. -We will go ahead and give her a liter of normal saline while she is here today along with IV antiemetics. -Labs from today 06/14/2020 show improved potassium and improving neutropenia.  Disposition: -Continue symptomatic treatment with creams and sitz bath.  If symptoms worsen patient to go to the emergency room.  We will schedule GI appointment on Monday morning.   Visit Diagnosis 1. Rectal pain   2. Acute myeloid leukemia not having achieved remission [facility])     Patient expressed understanding and was in agreement with this plan. She also  understands that She can call clinic at any time with any questions, concerns, or complaints.   Greater than 50% was spent in counseling and coordination of care with this patient including but not limited to discussion of the relevant topics above (See A&P) including, but not limited to diagnosis and management of acute and chronic medical conditions.   Thank you for allowing me to participate in the care of this very pleasant patient.    Jacquelin Hawking, NP [place] [facility] - [phone] Pager- [phone] 06/17/2020 4:36 AM

## 2020-06-14 NOTE — Progress Notes (Signed)
Patient here today as add on for [facility]. Patient called this morning to report nausea, weakness and hemorrhoids. Patient states that the pain from hemorrhoids is her concern today, she has been using the anusol suppositories that were sent in.

## 2020-06-14 NOTE — Progress Notes (Signed)
Pt given 1 liter IVF today per Rulon Abide NP, tolerated infusion well.  Pt refused oral zofran today stating that it makes her "feel sick".  Vidzaza injections x3 administered to left abdomen, sites benign.  Pt left the infusion suite stable and ambulatory.

## 2020-06-14 NOTE — Progress Notes (Deleted)
[place]  [phone] [phone]  ID: Debra Hale OB: 23-Dec-1950  MR#: [id number]  VOZ#:[id number]  Patient Care Team: [name], MD as PCP - General (Internal Medicine) Wellington Hampshire, MD as PCP - Cardiology (Cardiology)  CHIEF COMPLAINT: AML.  INTERVAL HISTORY: Patient returns to clinic today as an add-on for further evaluation and discussion of her bone marrow biopsy results.  She admits to increased weakness and fatigue today.  She continues to be anxious.  She has no neurologic complaints.  She denies any recent fevers or illnesses.  She has a good appetite and denies weight loss.  She has no chest pain, shortness of breath, cough, or hemoptysis.  She denies any nausea, vomiting, constipation, or diarrhea.  She has no urinary complaints.  Patient offers no further specific complaints today.  REVIEW OF SYSTEMS:   Review of Systems  Constitutional: Positive for malaise/fatigue. Negative for fever and weight loss.  Respiratory: Negative.  Negative for cough, hemoptysis and shortness of breath.   Cardiovascular: Negative.  Negative for chest pain and leg swelling.  Gastrointestinal: Negative.  Negative for abdominal pain.  Genitourinary: Negative.  Negative for dysuria.  Musculoskeletal: Negative.  Negative for back pain.  Skin: Negative.  Negative for rash.  Neurological: Positive for weakness. Negative for dizziness, focal weakness and headaches.  Psychiatric/Behavioral: The patient is nervous/anxious.     As per HPI. Otherwise, a complete review of systems is negative.  PAST MEDICAL HISTORY: Past Medical History:  Diagnosis Date  . Anxiety   . Arthritis    Osteoarthritis  . Breast cancer ([place]) 2009   right breast lumpectomy with rad tx  . Colon cancer ([place])    surgery with chemo and rad tx  . Complication of anesthesia   . GERD (gastroesophageal reflux disease)   . Heart palpitations   . History of hiatal hernia   .  History of kidney stones   . Hypothyroidism   . Liver disease   . Liver nodule    s/p negative biopsy  . Malignant neoplasm of thyroid gland ([place]) 2002   s/p surgery and XRT  . Osteoporosis   . Other and unspecified hyperlipidemia   . Palpitations   . Personal history of chemotherapy   . Personal history of malignant neoplasm of large intestine    carcinoma - cecum, s/p right laparoscopic colectomy - s/p chemotherapy and XRT  . Personal history of radiation therapy   . Pneumonia 2019  . PONV (postoperative nausea and vomiting)   . Pure hypercholesterolemia   . Unspecified hereditary and idiopathic peripheral neuropathy     PAST SURGICAL HISTORY: Past Surgical History:  Procedure Laterality Date  . APPENDECTOMY  1985  . BREAST BIOPSY Right 2009   positive  . BREAST BIOPSY Right 2009   negative  . BREAST LUMPECTOMY Right 2009   breast cancer  . CHOLECYSTECTOMY  1995  . COLONOSCOPY    . COLONOSCOPY WITH PROPOFOL N/A 10/29/2017   Procedure: COLONOSCOPY WITH PROPOFOL;  Surgeon: Manya Silvas, MD;  Location: [facility] ENDOSCOPY;  Service: Endoscopy;  Laterality: N/A;  . DILATION AND CURETTAGE OF UTERUS  1990  . DILATION AND CURETTAGE, DIAGNOSTIC / THERAPEUTIC  1990  . ESOPHAGOGASTRODUODENOSCOPY    . ESOPHAGOGASTRODUODENOSCOPY (EGD) WITH PROPOFOL N/A 10/29/2017   Procedure: ESOPHAGOGASTRODUODENOSCOPY (EGD) WITH PROPOFOL;  Surgeon: Manya Silvas, MD;  Location: [facility] ENDOSCOPY;  Service: Endoscopy;  Laterality: N/A;  . LAPAROSCOPIC PARTIAL COLECTOMY     stage 3-C carcinoma of  the cecum, s/p chemotherapy and xrt  . LITHOTRIPSY    . SIGMOIDOSCOPY  08/26/1993  . THYROID LOBECTOMY  2002   s/p XRT  . TOTAL HIP ARTHROPLASTY Right 10/24/2019   Procedure: TOTAL HIP ARTHROPLASTY;  Surgeon: Corky Mull, MD;  Location: [facility] ORS;  Service: Orthopedics;  Laterality: Right;    FAMILY HISTORY: Family History  Problem Relation Age of Onset  . Stroke Mother        41s  . Alzheimer's  disease Mother   . Cancer Mother   . Lung cancer Father   . Prostate cancer Father   . Cancer Father        Colon  . Colon cancer Father   . Breast cancer Sister        6's  . Lung cancer Sister   . Breast cancer Maternal Aunt     ADVANCED DIRECTIVES (Y/N):  N  HEALTH MAINTENANCE: Social History   Tobacco Use  . Smoking status: Never Smoker  . Smokeless tobacco: Never Used  Vaping Use  . Vaping Use: Never used  Substance Use Topics  . Alcohol use: No    Alcohol/week: 0.0 standard drinks  . Drug use: No     Colonoscopy:  PAP:  Bone density:  Lipid panel:  Allergies  Allergen Reactions  . Demeclocycline Other (See Comments)    Throat swells  . Tetracyclines & Related Other (See Comments)    Throat swells   . Augmentin [Amoxicillin-Pot Clavulanate] Diarrhea  . Bentyl [Dicyclomine Hcl]     unkn  . Ciprofloxacin Diarrhea  . Codeine Other (See Comments)    dizziness    . Epinephrine     Speeds heart up - panic   . Flagyl [Metronidazole] Nausea And Vomiting  . Librax [Chlordiazepoxide-Clidinium]     unkn  . Novocain [Procaine] Other (See Comments)    "Shaky"  . Phenobarbital     unkn  . Prednisone     Nervous    . Ultram [Tramadol] Other (See Comments)    Sick feeling    Current Outpatient Medications  Medication Sig Dispense Refill  . Azelastine-Fluticasone (DYMISTA) 137-50 MCG/ACT SUSP 2 sprays each nostril q day 23 g 2  . clonazePAM (KLONOPIN) 0.5 MG tablet Take 1.5 mg by mouth 2 (two) times daily.   0  . diphenoxylate-atropine (LOMOTIL) 2.5-0.025 MG tablet Take 1 tablet by mouth 4 (four) times daily as needed for diarrhea or loose stools. 30 tablet 0  . escitalopram (LEXAPRO) 20 MG tablet Take 20 mg by mouth every morning.     . hydrocortisone (ANUSOL-HC) 25 MG suppository Place 1 suppository (25 mg total) rectally 2 (two) times daily. 12 suppository 0  . levofloxacin (LEVAQUIN) 500 MG tablet Take by mouth.    . levothyroxine (SYNTHROID) 125 MCG  tablet TAKE 1 TABLET BY MOUTH EVERY DAY 90 tablet 1  . loperamide (IMODIUM) 2 MG capsule Take by mouth.    . magnesium oxide (MAG-OX) 400 MG tablet TAKE 1 TABLET BY MOUTH EVERY DAY 90 tablet 1  . Misc. Devices (SITZ BATH) MISC 10 Packages by Does not apply route in the morning, at noon, in the evening, and at bedtime. 10 each 1  . ondansetron (ZOFRAN) 8 MG tablet Take 1 tablet (8 mg total) by mouth every 8 (eight) hours as needed for nausea or vomiting. 20 tablet 0  . pantoprazole (PROTONIX) 40 MG tablet TAKE 1 TABLET BY MOUTH EVERY DAY 90 tablet 2  . potassium chloride SA (  KLOR-CON) 20 MEQ tablet Take 1 tablet (20 mEq total) by mouth 2 (two) times daily. 21 tablet 0  . valACYclovir (VALTREX) 500 MG tablet Take by mouth.    . venetoclax (VENCLEXTA) 100 MG tablet Take by mouth daily. Tablets should be swallowed whole with a meal and a full glass of water.     Current Facility-Administered Medications  Medication Dose Route Frequency Provider Last Rate Last Admin  . ondansetron (ZOFRAN) tablet 8 mg  8 mg Oral Once Guse, Jacquelynn Cree, FNP       Facility-Administered Medications Ordered in Other Visits  Medication Dose Route Frequency Provider Last Rate Last Admin  . ondansetron (ZOFRAN) tablet 8 mg  8 mg Oral Once Lloyd Huger, MD        OBJECTIVE: There were no vitals filed for this visit.   There is no height or weight on file to calculate BMI.    ECOG FS:0 - Asymptomatic  General: Well-developed, well-nourished, no acute distress. Eyes: Pink conjunctiva, anicteric sclera. HEENT: Normocephalic, moist mucous membranes. Lungs: No audible wheezing or coughing. Heart: Regular rate and rhythm. Abdomen: Soft, nontender, no obvious distention. Musculoskeletal: No edema, cyanosis, or clubbing. Neuro: Alert, answering all questions appropriately. Cranial nerves grossly intact. Skin: No rashes or petechiae noted. Psych: Normal affect.   LAB RESULTS:  Lab Results  Component Value Date    NA 139 06/14/2020   K 3.4 (L) 06/14/2020   CL 104 06/14/2020   CO2 27 06/14/2020   GLUCOSE 114 (H) 06/14/2020   BUN 10 06/14/2020   CREATININE 0.98 06/14/2020   CALCIUM 9.0 06/14/2020   PROT 6.7 06/14/2020   ALBUMIN 3.4 (L) 06/14/2020   AST 12 (L) 06/14/2020   ALT 19 06/14/2020   ALKPHOS 99 06/14/2020   BILITOT 1.0 06/14/2020   GFRNONAA >60 06/14/2020   GFRAA 54 (L) 10/26/2019    Lab Results  Component Value Date   WBC 2.8 (L) 06/14/2020   NEUTROABS 2.1 06/14/2020   HGB 12.5 06/14/2020   HCT 37.0 06/14/2020   MCV 82.6 06/14/2020   PLT 201 06/14/2020     STUDIES: No results found.  ASSESSMENT: AML.  PLAN:    1.  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  Her peripheral blood flow cytometry also revealed 6% aberrant myeloblasts.  After lengthy discussion with the patient, she agreed to a referral to [facility] for further evaluation and initiation of treatment.  Once treatment is initiated, patient will likely return for continuation and completion.  She possibly will receive venetoclax and azacitidine.  No follow-up has been scheduled at this time.  Appointment at [facility] is in approximately 1 week. 2.  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 Previously, iron stores, B12, and folate were all within normal limits. Antineutrophil antibodies are positive. referral to [facility] as above. 3. 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  Patient also received chemotherapy and likely received Adriamycin, but exact regimen is unknown.  She completed 5 years of hormonal therapy in approximately June 2015. Currently, she has no evidence of disease.  Her most recent mammogram on March 05, 2020 was reported as BI-RADS 1.  Repeat in November 2022. 4.  History of colon cancer: Patient also states that she received chemotherapy for this, possibly  FOLFOX but again this is unknown.   Patient expressed understanding and was in agreement with this plan. She also  understands that She can call clinic at any time with any questions, concerns, or complaints.   Cancer Staging History of breast cancer Staging form: Breast, AJCC 7th Edition - Clinical stage from 01/07/2016: Stage IA (T1c, N0, M0) - Signed by Lloyd Huger, MD on 01/07/2016 Laterality: Right Estrogen receptor status: Positive Progesterone receptor status: Positive HER2 status: Negative   Lloyd Huger, MD   06/14/2020 4:21 PM

## 2020-06-16 ENCOUNTER — Emergency Department: Payer: PPO

## 2020-06-16 ENCOUNTER — Other Ambulatory Visit: Payer: Self-pay

## 2020-06-16 ENCOUNTER — Observation Stay
Admission: EM | Admit: 2020-06-16 | Discharge: 2020-06-17 | Disposition: A | Discharge status: Home / Self Care | Payer: PPO | Attending: Surgery | Admitting: Surgery

## 2020-06-16 DIAGNOSIS — Z20822 Contact with and (suspected) exposure to covid-19: Secondary | ICD-10-CM | POA: Diagnosis not present

## 2020-06-16 DIAGNOSIS — E876 Hypokalemia: Secondary | ICD-10-CM

## 2020-06-16 DIAGNOSIS — Z85038 Personal history of other malignant neoplasm of large intestine: Secondary | ICD-10-CM | POA: Diagnosis not present

## 2020-06-16 DIAGNOSIS — E890 Postprocedural hypothyroidism: Secondary | ICD-10-CM | POA: Diagnosis not present

## 2020-06-16 DIAGNOSIS — R110 Nausea: Secondary | ICD-10-CM

## 2020-06-16 DIAGNOSIS — Z853 Personal history of malignant neoplasm of breast: Secondary | ICD-10-CM | POA: Insufficient documentation

## 2020-06-16 DIAGNOSIS — E039 Hypothyroidism, unspecified: Secondary | ICD-10-CM

## 2020-06-16 DIAGNOSIS — M1612 Unilateral primary osteoarthritis, left hip: Secondary | ICD-10-CM | POA: Diagnosis not present

## 2020-06-16 DIAGNOSIS — K6389 Other specified diseases of intestine: Secondary | ICD-10-CM | POA: Diagnosis not present

## 2020-06-16 DIAGNOSIS — K6139 Other ischiorectal abscess: Principal | ICD-10-CM | POA: Insufficient documentation

## 2020-06-16 DIAGNOSIS — K611 Rectal abscess: Secondary | ICD-10-CM | POA: Diagnosis present

## 2020-06-16 DIAGNOSIS — Z79899 Other long term (current) drug therapy: Secondary | ICD-10-CM | POA: Insufficient documentation

## 2020-06-16 DIAGNOSIS — Z85850 Personal history of malignant neoplasm of thyroid: Secondary | ICD-10-CM | POA: Insufficient documentation

## 2020-06-16 DIAGNOSIS — C9202 Acute myeloblastic leukemia, in relapse: Secondary | ICD-10-CM | POA: Diagnosis not present

## 2020-06-16 DIAGNOSIS — C9590 Leukemia, unspecified not having achieved remission: Secondary | ICD-10-CM | POA: Diagnosis not present

## 2020-06-16 DIAGNOSIS — D849 Immunodeficiency, unspecified: Secondary | ICD-10-CM | POA: Diagnosis not present

## 2020-06-16 LAB — COMPREHENSIVE METABOLIC PANEL
ALT: 18 U/L (ref 0–44)
AST: 10 U/L — ABNORMAL LOW (ref 15–41)
Albumin: 3.3 g/dL — ABNORMAL LOW (ref 3.5–5.0)
Alkaline Phosphatase: 103 U/L (ref 38–126)
Anion gap: 11 (ref 5–15)
BUN: 10 mg/dL (ref 8–23)
CO2: 27 mmol/L (ref 22–32)
Calcium: 9.1 mg/dL (ref 8.9–10.3)
Chloride: 102 mmol/L (ref 98–111)
Creatinine, Ser: 0.88 mg/dL (ref 0.44–1.00)
GFR, Estimated: >60 mL/min (ref >60)
Glucose, Bld: 112 mg/dL — ABNORMAL HIGH (ref 70–99)
Potassium: 3.3 mmol/L — ABNORMAL LOW (ref 3.5–5.1)
Sodium: 140 mmol/L (ref 135–145)
Total Bilirubin: 0.6 mg/dL (ref 0.3–1.2)
Total Protein: 6.8 g/dL (ref 6.5–8.1)

## 2020-06-16 LAB — CBC
HCT: 35.9 % — ABNORMAL LOW (ref 36.0–46.0)
Hemoglobin: 11.8 g/dL — ABNORMAL LOW (ref 12.0–15.0)
MCH: 27.3 pg (ref 26.0–34.0)
MCHC: 32.9 g/dL (ref 30.0–36.0)
MCV: 82.9 fL (ref 80.0–100.0)
Platelets: 237 10*3/uL (ref 150–400)
RBC: 4.33 MIL/uL (ref 3.87–5.11)
RDW: 16.1 % — ABNORMAL HIGH (ref 11.5–15.5)
WBC: 3.4 10*3/uL — ABNORMAL LOW (ref 4.0–10.5)
nRBC: 0 % (ref 0.0–0.2)

## 2020-06-16 LAB — RESP PANEL BY RT-PCR (FLU A&B, COVID) ARPGX2
Influenza A by PCR: NEGATIVE
Influenza B by PCR: NEGATIVE
SARS Coronavirus 2 by RT PCR: NEGATIVE

## 2020-06-16 MED ORDER — PIPERACILLIN-TAZOBACTAM 3.375 G IVPB 30 MIN: 3.3750 g | Freq: Three times a day (TID) | INTRAVENOUS | Status: DC

## 2020-06-16 MED ORDER — ONDANSETRON HCL 4 MG PO TABS
8.0000 mg | ORAL_TABLET | Freq: Once | ORAL | Status: AC
  Administered 2020-06-17: 8 mg via ORAL
  Filled 2020-06-16: qty 2

## 2020-06-16 MED ORDER — POTASSIUM CHLORIDE CRYS ER 20 MEQ PO TBCR
20.0000 meq | EXTENDED_RELEASE_TABLET | Freq: Two times a day (BID) | ORAL | Status: DC
  Administered 2020-06-17: 20 meq via ORAL

## 2020-06-16 MED ORDER — HEPARIN SODIUM (PORCINE) 5000 UNIT/ML IJ SOLN
5000.0000 [IU] | Freq: Three times a day (TID) | INTRAMUSCULAR | Status: DC
  Administered 2020-06-17 (×2): 5000 [IU] via SUBCUTANEOUS
  Filled 2020-06-16 (×2): qty 1

## 2020-06-16 MED ORDER — DIPHENHYDRAMINE HCL 50 MG/ML IJ SOLN: 12.5000 mg | Freq: Four times a day (QID) | INTRAMUSCULAR | Status: DC | PRN

## 2020-06-16 MED ORDER — DEXTROSE-NACL 5-0.45 % IV SOLN: INTRAVENOUS | Status: DC

## 2020-06-16 MED ORDER — CIPROFLOXACIN IN D5W 400 MG/200ML IV SOLN: 400.0000 mg | Freq: Two times a day (BID) | INTRAVENOUS | Status: DC

## 2020-06-16 MED ORDER — DIPHENHYDRAMINE HCL 12.5 MG/5ML PO ELIX: 12.5000 mg | ORAL_SOLUTION | Freq: Four times a day (QID) | ORAL | Status: DC | PRN

## 2020-06-16 MED ORDER — PIPERACILLIN-TAZOBACTAM 3.375 G IVPB: 3.3750 g | Freq: Three times a day (TID) | INTRAVENOUS | Status: DC

## 2020-06-16 MED ORDER — PROCHLORPERAZINE EDISYLATE 10 MG/2ML IJ SOLN: 5.0000 mg | Freq: Four times a day (QID) | INTRAMUSCULAR | Status: DC | PRN

## 2020-06-16 MED ORDER — ACETAMINOPHEN 500 MG PO TABS
1000.0000 mg | ORAL_TABLET | Freq: Four times a day (QID) | ORAL | Status: DC
  Administered 2020-06-17 (×2): 1000 mg via ORAL
  Filled 2020-06-16: qty 2

## 2020-06-16 MED ORDER — PROCHLORPERAZINE MALEATE 10 MG PO TABS
10.0000 mg | ORAL_TABLET | Freq: Four times a day (QID) | ORAL | Status: DC | PRN
  Filled 2020-06-16: qty 1

## 2020-06-16 MED ORDER — PIPERACILLIN-TAZOBACTAM 3.375 G IVPB
3.3750 g | Freq: Three times a day (TID) | INTRAVENOUS | Status: DC
  Administered 2020-06-16 – 2020-06-17 (×3): 3.375 g via INTRAVENOUS
  Filled 2020-06-16 (×2): qty 50

## 2020-06-16 MED ORDER — POTASSIUM CHLORIDE 20 MEQ PO PACK
40.0000 meq | PACK | Freq: Once | ORAL | Status: AC
  Administered 2020-06-17: 40 meq via ORAL
  Filled 2020-06-16: qty 2

## 2020-06-16 MED ORDER — ONDANSETRON HCL 4 MG/2ML IJ SOLN
4.0000 mg | Freq: Once | INTRAMUSCULAR | Status: AC
  Administered 2020-06-16: 4 mg via INTRAVENOUS
  Filled 2020-06-16: qty 2

## 2020-06-16 MED ORDER — METRONIDAZOLE IN NACL 5-0.79 MG/ML-% IV SOLN: 500.0000 mg | Freq: Three times a day (TID) | INTRAVENOUS | Status: DC

## 2020-06-16 MED ORDER — KETOROLAC TROMETHAMINE 15 MG/ML IJ SOLN
15.0000 mg | Freq: Four times a day (QID) | INTRAMUSCULAR | Status: DC | PRN
  Filled 2020-06-16: qty 1

## 2020-06-16 MED ORDER — FENTANYL CITRATE (PF) 100 MCG/2ML IJ SOLN
50.0000 ug | Freq: Once | INTRAMUSCULAR | Status: AC
Start: 2020-06-16 — End: 2020-06-16
  Administered 2020-06-16: 50 ug via INTRAVENOUS
  Filled 2020-06-16: qty 2

## 2020-06-16 MED ORDER — CLONAZEPAM 0.5 MG PO TABS: 1.5000 mg | ORAL_TABLET | Freq: Two times a day (BID) | ORAL | Status: DC

## 2020-06-16 MED ORDER — OXYCODONE HCL 5 MG PO TABS
5.0000 mg | ORAL_TABLET | ORAL | Status: DC | PRN
Start: 2020-06-16 — End: 2020-06-17
  Administered 2020-06-17: 5 mg via ORAL
  Filled 2020-06-16: qty 1

## 2020-06-16 MED ORDER — LOPERAMIDE HCL 2 MG PO CAPS: 2.0000 mg | ORAL_CAPSULE | ORAL | Status: DC | PRN

## 2020-06-16 MED ORDER — DIPHENOXYLATE-ATROPINE 2.5-0.025 MG PO TABS: 1.0000 | ORAL_TABLET | Freq: Four times a day (QID) | ORAL | Status: DC | PRN

## 2020-06-16 MED ORDER — PANTOPRAZOLE SODIUM 40 MG PO TBEC: 40.0000 mg | DELAYED_RELEASE_TABLET | Freq: Every day | ORAL | Status: DC

## 2020-06-16 MED ORDER — MAGNESIUM OXIDE 400 (241.3 MG) MG PO TABS
400.0000 mg | ORAL_TABLET | Freq: Every day | ORAL | Status: DC
  Filled 2020-06-16: qty 1

## 2020-06-16 MED ORDER — ESCITALOPRAM OXALATE 10 MG PO TABS
20.0000 mg | ORAL_TABLET | ORAL | Status: DC
  Administered 2020-06-17: 20 mg via ORAL
  Filled 2020-06-16: qty 2

## 2020-06-16 MED ORDER — ONDANSETRON HCL 4 MG PO TABS: 8.0000 mg | ORAL_TABLET | Freq: Three times a day (TID) | ORAL | Status: DC | PRN

## 2020-06-16 MED ORDER — METOPROLOL TARTRATE 5 MG/5ML IV SOLN: 5.0000 mg | Freq: Four times a day (QID) | INTRAVENOUS | Status: DC | PRN

## 2020-06-16 MED ORDER — VENETOCLAX 100 MG PO TABS: 100.0000 mg | ORAL_TABLET | Freq: Every day | ORAL | Status: DC

## 2020-06-16 MED ORDER — HYDROMORPHONE HCL 1 MG/ML IJ SOLN
0.5000 mg | INTRAMUSCULAR | Status: DC | PRN
  Administered 2020-06-17 (×2): 0.5 mg via INTRAVENOUS
  Filled 2020-06-16 (×2): qty 1

## 2020-06-16 MED ORDER — IOHEXOL 300 MG/ML  SOLN
100.0000 mL | Freq: Once | INTRAMUSCULAR | Status: AC | PRN
  Administered 2020-06-16: 100 mL via INTRAVENOUS

## 2020-06-16 MED ORDER — SODIUM CHLORIDE 0.9 % IV BOLUS
1000.0000 mL | Freq: Once | INTRAVENOUS | Status: AC
  Administered 2020-06-16: 1000 mL via INTRAVENOUS

## 2020-06-16 MED ORDER — CLONAZEPAM 0.5 MG PO TBDP
1.0000 mg | ORAL_TABLET | Freq: Three times a day (TID) | ORAL | Status: DC | PRN
  Administered 2020-06-17: 1 mg via ORAL
  Filled 2020-06-16: qty 2

## 2020-06-16 MED ORDER — ONDANSETRON 4 MG PO TBDP: 4.0000 mg | ORAL_TABLET | Freq: Four times a day (QID) | ORAL | Status: DC | PRN

## 2020-06-16 MED ORDER — ONDANSETRON HCL 4 MG/2ML IJ SOLN: 4.0000 mg | Freq: Four times a day (QID) | INTRAMUSCULAR | Status: DC | PRN

## 2020-06-16 MED ORDER — PANTOPRAZOLE SODIUM 40 MG IV SOLR
40.0000 mg | Freq: Two times a day (BID) | INTRAVENOUS | Status: DC
  Administered 2020-06-17: 40 mg via INTRAVENOUS
  Filled 2020-06-16: qty 40

## 2020-06-16 MED ORDER — LEVOTHYROXINE SODIUM 50 MCG PO TABS
125.0000 ug | ORAL_TABLET | Freq: Every day | ORAL | Status: DC
  Administered 2020-06-17: 125 ug via ORAL
  Filled 2020-06-16: qty 3

## 2020-06-16 MED ORDER — ZOLPIDEM TARTRATE 5 MG PO TABS: 5.0000 mg | ORAL_TABLET | Freq: Every evening | ORAL | Status: DC | PRN

## 2020-06-16 NOTE — ED Provider Notes (Signed)
[facility] Emergency Department Provider Note  Time seen: 9:16 PM  I have reviewed the triage vital signs and the nursing notes.   HISTORY  Chief Complaint Rectal Pain   HPI Debra Hale is a 70 y.o. female with a past medical history of anxiety, gastric reflux, leukemia on chemotherapy who presents to the emergency department for rectal pain.  According to the patient she has been experiencing diarrhea for the past week or so until 4 days ago when she developed constipation.  Patient states she had a very small bowel movement this morning for her first bowel movement in 4 days.  Patient states significant rectal pain since Friday.  States she is having pain to sit on her buttocks and must lie on her side due to the rectal pain.  Patient believes she is experiencing hemorrhoids has tried over-the-counter remedies such as Preparation H without relief.   Past Medical History:  Diagnosis Date  . Anxiety   . Arthritis    Osteoarthritis  . Breast cancer ([place]) 2009   right breast lumpectomy with rad tx  . Colon cancer ([place])    surgery with chemo and rad tx  . Complication of anesthesia   . GERD (gastroesophageal reflux disease)   . Heart palpitations   . History of hiatal hernia   . History of kidney stones   . Hypothyroidism   . Liver disease   . Liver nodule    s/p negative biopsy  . Malignant neoplasm of thyroid gland ([place]) 2002   s/p surgery and XRT  . Osteoporosis   . Other and unspecified hyperlipidemia   . Palpitations   . Personal history of chemotherapy   . Personal history of malignant neoplasm of large intestine    carcinoma - cecum, s/p right laparoscopic colectomy - s/p chemotherapy and XRT  . Personal history of radiation therapy   . Pneumonia 2019  . PONV (postoperative nausea and vomiting)   . Pure hypercholesterolemia   . Unspecified hereditary and idiopathic peripheral neuropathy     Patient Active Problem List   Diagnosis Date  Noted  . 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[place]) 04/12/2020  . Anemia 02/11/2020  . 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 Rectal bleed 06/17/2019  . Pleural effusion 12/03/2018  . Right hip pain 12/03/2018  . Leukopenia 08/21/2018  . Change in vision 06/24/2018  . Hypothyroidism 05/02/2018  . Neck pain 05/02/2018  . Nonintractable headache 04/18/2018  . Thrush 04/18/2018  . Pleural effusion associated with pulmonary infection 04/18/2018  . Other fatigue 04/18/2018  . Abdominal pain 03/06/2016  . Acute diarrhea 01/10/2016  . Neck nodule 10/13/2015  . Vitamin D deficiency 07/27/2015  . Loose stools 04/14/2015  . Mild depression ([place]) 02/02/2015  . Health care maintenance 06/17/2014  . Stress 01/28/2014  . Acute pericarditis 05/31/2013  . Environmental allergies 02/25/2013  . Left elbow pain 02/25/2013  . Abnormal liver function test 12/07/2012  . Anxiety 05/06/2012  . GERD (gastroesophageal reflux disease) 05/06/2012  . Osteoporosis 05/06/2012  . History of breast cancer 09/18/2008  . History of thyroid cancer 09/18/2008  . Hypercholesterolemia 09/18/2008  . Peripheral neuropathy 09/18/2008  . Palpitations 09/18/2008  . History of malignant neoplasm of large intestine 09/18/2008    Past Surgical History:  Procedure Laterality Date  . APPENDECTOMY  1985  . BREAST BIOPSY Right 2009   positive  . BREAST BIOPSY Right 2009   negative  . BREAST LUMPECTOMY Right 2009  breast cancer  . CHOLECYSTECTOMY  1995  . COLONOSCOPY    . COLONOSCOPY WITH PROPOFOL N/A 10/29/2017   Procedure: COLONOSCOPY WITH PROPOFOL;  Surgeon: Manya Silvas, MD;  Location: [facility] ENDOSCOPY;  Service: Endoscopy;  Laterality: N/A;  . DILATION AND CURETTAGE OF UTERUS  1990  . DILATION AND CURETTAGE, DIAGNOSTIC / THERAPEUTIC  1990  . ESOPHAGOGASTRODUODENOSCOPY    . ESOPHAGOGASTRODUODENOSCOPY (EGD) WITH PROPOFOL N/A 10/29/2017   Procedure: ESOPHAGOGASTRODUODENOSCOPY (EGD) WITH PROPOFOL;   Surgeon: Manya Silvas, MD;  Location: [facility] ENDOSCOPY;  Service: Endoscopy;  Laterality: N/A;  . LAPAROSCOPIC PARTIAL COLECTOMY     stage 3-C carcinoma of the cecum, s/p chemotherapy and xrt  . LITHOTRIPSY    . SIGMOIDOSCOPY  08/26/1993  . THYROID LOBECTOMY  2002   s/p XRT  . TOTAL HIP ARTHROPLASTY Right 10/24/2019   Procedure: TOTAL HIP ARTHROPLASTY;  Surgeon: Corky Mull, MD;  Location: [facility] ORS;  Service: Orthopedics;  Laterality: Right;    Prior to Admission medications   Medication Sig Start Date End Date Taking? Authorizing Provider  Azelastine-Fluticasone [facility]) 137-50 MCG/ACT SUSP 2 sprays each nostril q day 02/01/20   [name], MD  clonazePAM (KLONOPIN) 0.5 MG tablet Take 1.5 mg by mouth 2 (two) times daily.  09/06/17   [provider]  diphenoxylate-atropine (LOMOTIL) 2.5-0.025 MG tablet Take 1 tablet by mouth 4 (four) times daily as needed for diarrhea or loose stools. 06/11/20   Lloyd Huger, MD  escitalopram (LEXAPRO) 20 MG tablet Take 20 mg by mouth every morning.     [provider]  hydrocortisone (ANUSOL-HC) 25 MG suppository Place 1 suppository (25 mg total) rectally 2 (two) times daily. 06/10/20   Jacquelin Hawking, NP  levofloxacin (LEVAQUIN) 500 MG tablet Take by mouth. 06/04/20   [provider]  levothyroxine (SYNTHROID) 125 MCG tablet TAKE 1 TABLET BY MOUTH EVERY DAY 05/23/20   [name], MD  loperamide (IMODIUM) 2 MG capsule Take by mouth.    [provider]  magnesium oxide (MAG-OX) 400 MG tablet TAKE 1 TABLET BY MOUTH EVERY DAY 12/05/19   [name], MD  Misc. Devices (SITZ BATH) MISC 10 Packages by Does not apply route in the morning, at noon, in the evening, and at bedtime. 06/14/20   Jacquelin Hawking, NP  ondansetron (ZOFRAN) 8 MG tablet Take 1 tablet (8 mg total) by mouth every 8 (eight) hours as needed for nausea or vomiting. 06/10/20   Jacquelin Hawking, NP  pantoprazole (PROTONIX) 40 MG tablet TAKE 1  TABLET BY MOUTH EVERY DAY 05/23/20   [name], MD  potassium chloride SA (KLOR-CON) 20 MEQ tablet Take 1 tablet (20 mEq total) by mouth 2 (two) times daily. 06/10/20   Jacquelin Hawking, NP  valACYclovir (VALTREX) 500 MG tablet Take by mouth. 05/21/20   [provider]  venetoclax (VENCLEXTA) 100 MG tablet Take by mouth daily. Tablets should be swallowed whole with a meal and a full glass of water.    [provider]    Allergies  Allergen Reactions  . Demeclocycline Other (See Comments)    Throat swells  . Tetracyclines & Related Other (See Comments)    Throat swells   . Augmentin [Amoxicillin-Pot Clavulanate] Diarrhea  . Bentyl [Dicyclomine Hcl]     unkn  . Ciprofloxacin Diarrhea  . Codeine Other (See Comments)    dizziness    . Epinephrine     Speeds heart up - panic   . Flagyl [Metronidazole]  Nausea And Vomiting  . Librax [Chlordiazepoxide-Clidinium]     unkn  . Novocain [Procaine] Other (See Comments)    "Shaky"  . Phenobarbital     unkn  . Prednisone     Nervous    . Ultram [Tramadol] Other (See Comments)    Sick feeling    Family History  Problem Relation Age of Onset  . Stroke Mother        52s  . Alzheimer's disease Mother   . Cancer Mother   . Lung cancer Father   . Prostate cancer Father   . Cancer Father        Colon  . Colon cancer Father   . Breast cancer Sister        80's  . Lung cancer Sister   . Breast cancer Maternal Aunt     Social History Social History   Tobacco Use  . Smoking status: Never Smoker  . Smokeless tobacco: Never Used  Vaping Use  . Vaping Use: Never used  Substance Use Topics  . Alcohol use: No    Alcohol/week: 0.0 standard drinks  . Drug use: No    Review of Systems Constitutional: Negative for fever. Cardiovascular: Negative for chest pain. Respiratory: Negative for shortness of breath. Gastrointestinal: Negative for abdominal pain.  Positive for rectal pain. Genitourinary: Negative for  urinary compaints Musculoskeletal: Negative for musculoskeletal complaints Neurological: Negative for headache All other ROS negative  ____________________________________________   PHYSICAL EXAM:  VITAL SIGNS: ED Triage Vitals  Enc Vitals Group     BP 06/16/20 2013 (!) 147/81     Pulse Rate 06/16/20 2013 99     Resp 06/16/20 2013 18     Temp 06/16/20 2013 98.3 F (36.8 C)     Temp Source 06/16/20 2013 Oral     SpO2 06/16/20 2013 100 %     Weight 06/16/20 2014 138 lb (62.6 kg)     Height 06/16/20 2014 5\' 4"  (1.626 m)     Head Circumference --      Peak Flow --      Pain Score 06/16/20 2014 7     Pain Loc --      Pain Edu? --      Excl. in [place]? --    Constitutional: Alert and oriented. Well appearing and in no distress. Eyes: Normal exam ENT      Head: Normocephalic and atraumatic.      Mouth/Throat: Mucous membranes are moist. Cardiovascular: Normal rate, regular rhythm. Respiratory: Normal respiratory effort without tachypnea nor retractions. Breath sounds are clear  Gastrointestinal: Soft and nontender. No distention.   Rectal: Patient does have moderate tenderness palpation with induration of the skin to the posterior left area of the anus with a very small hemorrhoid.  No sign of thrombosed hemorrhoids.  Rectal exam negative for fecal impaction Musculoskeletal: Nontender with normal range of motion in all extremities.  Neurologic:  Normal speech and language.  Psychiatric: Mood and affect are normal.  ____________________________________________   RADIOLOGY  CT shows a 3 cm perirectal abscess.  ____________________________________________   INITIAL IMPRESSION / ASSESSMENT AND PLAN / ED COURSE  Pertinent labs & imaging results that were available during my care of the patient were reviewed by me and considered in my medical decision making (see chart for details).   Patient presents emergency department for rectal pain.  Patient is on chemotherapy for  leukemia.  Patient has a very small hemorrhoid on exam but no large hemorrhoids or thrombosed  hemorrhoids.  Patient does have an area approximately 2 cm x 2 cm to the posterior right anus concerning for possible hemorrhoid versus perianal abscess.  Given the patient's immunocompromise state we will proceed with labs and CT imaging with contrast to rule out perianal abscess.  CT scan shows 3 cm perirectal abscess.  Patient is on daily chemotherapy for AML.  I spoke to Dr. Dahlia Byes of general surgery who will likely drain in the morning.  We will admit to the hospitalist service.  Debra Hale was evaluated in Emergency Department on 06/16/2020 for the symptoms described in the history of present illness. She was evaluated in the context of the global COVID-19 pandemic, which necessitated consideration that the patient might be at risk for infection with the SARS-CoV-2 virus that causes COVID-19. Institutional protocols and algorithms that pertain to the evaluation of patients at risk for COVID-19 are in a state of rapid change based on information released by regulatory bodies including the CDC and federal and state organizations. These policies and algorithms were followed during the patient's care in the ED.  ____________________________________________   FINAL CLINICAL IMPRESSION(S) / ED DIAGNOSES  Perirectal abscess   Harvest Dark, MD 06/17/20 2201

## 2020-06-16 NOTE — ED Notes (Signed)
Pt states hemorrhoid pain since Friday. Pt denies blood seen in stool  Pt states no BM in the last 4 days, despite OTC laxative/suppository use.

## 2020-06-16 NOTE — Progress Notes (Signed)
D/w Dr. Kerman Passey Perirectal abscess CT reviewed on a pt actively receiving chemo for leukemia and immunocompromised. IV a/bs, admission and do drainage in the OR in am.

## 2020-06-16 NOTE — ED Triage Notes (Addendum)
Pt states hemorrhoids are painful. Pt states has had rectal pain since Friday, but no rectal bleeding. Pt states she can feel "them in my butt". Pt states is also taking chemo for cancer. Pt appears uncomfortable, not able to sit, states 'something is hanging out of me"

## 2020-06-16 NOTE — H&P (Signed)
CONSULT NOTE:      [place]   PATIENT NAME: Debra Hale    MR#:  [id number]  DATE OF BIRTH:  12/12/1950  DATE OF ADMISSION:  06/16/2020  PRIMARY CARE PHYSICIAN: [name], MD   Patient is coming from: Home  REQUESTING/REFERRING PHYSICIAN: Jules Husbands, MD  CHIEF COMPLAINT:   Chief Complaint  Patient presents with  . Rectal Pain    HISTORY OF PRESENT ILLNESS:  Debra Hale is a 70 y.o. 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  She had a very small bowel movement this morning.  Her pain has been significant enough she was unable to sit on her buttocks.  She has been taking Preparation H for her hemorrhoids.  No reported fever or chills.  No nausea or vomiting or abdominal pain.  No chest pain or palpitations.  No cough or wheezing or hemoptysis.  No dysuria, oliguria or hematuria or flank pain.  She was recently seen in follow-up by Dr. [name] on 06/13/2020. ED Course: When she came to the ER, blood pressure was 148/82 with otherwise normal vital signs.  Labs revealed mild hypokalemia of 3.3 and mild leukopenia of 3.4 up from 2.8 on 06/14/2020. Imaging: Pelvic CT today showed perirectal abscess measuring 3.3 X2.7X 2.9 cm just to the left of the midline with inflammatory changes extending inferiorly to involve the left gluteal crease.  The patient was given 50 mcg of IV fentanyl, 4 mg of IV Zofran and 3.375 g of IV Zosyn with 1 L bolus of IV normal saline.  She was admitted by Dr. Dahlia Byes to an observation surgical bed for incision and drainage in a.m.  I was consulted for medical evaluation and management. PAST MEDICAL HISTORY:    Past Medical History:  Diagnosis Date  . Anxiety   . Arthritis    Osteoarthritis  . Breast cancer ([place]) 2009   right breast lumpectomy with rad tx  . Colon cancer ([place])    surgery with chemo and rad tx  . Complication of anesthesia   . GERD (gastroesophageal reflux disease)   . Heart palpitations   . History of hiatal hernia   . History of kidney stones   . Hypothyroidism   . Liver disease   . Liver nodule    s/p negative biopsy  . Malignant neoplasm of thyroid gland ([place]) 2002   s/p surgery and XRT  . Osteoporosis   . Other and unspecified hyperlipidemia   . Palpitations   . Personal history of chemotherapy   . Personal history of malignant neoplasm of large intestine    carcinoma - cecum, s/p right laparoscopic colectomy - s/p chemotherapy and XRT  . Personal history of radiation therapy   . Pneumonia 2019  . PONV (postoperative nausea and vomiting)   . Pure hypercholesterolemia   . Unspecified hereditary and idiopathic peripheral neuropathy     PAST SURGICAL HISTORY:   Past Surgical History:  Procedure Laterality Date  . APPENDECTOMY  1985  . BREAST BIOPSY Right 2009   positive  . BREAST BIOPSY Right 2009   negative  . BREAST LUMPECTOMY Right 2009   breast cancer  . CHOLECYSTECTOMY  1995  . COLONOSCOPY    .  COLONOSCOPY WITH PROPOFOL N/A 10/29/2017   Procedure: COLONOSCOPY WITH PROPOFOL;  Surgeon: Manya Silvas, MD;  Location: [facility] ENDOSCOPY;  Service: Endoscopy;  Laterality: N/A;  . DILATION AND CURETTAGE OF UTERUS  1990  . DILATION AND CURETTAGE, DIAGNOSTIC / THERAPEUTIC  1990  . ESOPHAGOGASTRODUODENOSCOPY    . ESOPHAGOGASTRODUODENOSCOPY (EGD) WITH PROPOFOL N/A 10/29/2017   Procedure: ESOPHAGOGASTRODUODENOSCOPY (EGD) WITH PROPOFOL;  Surgeon: Manya Silvas, MD;  Location: [facility] ENDOSCOPY;  Service: Endoscopy;  Laterality: N/A;  . LAPAROSCOPIC PARTIAL COLECTOMY     stage 3-C carcinoma of the cecum, s/p chemotherapy and xrt  . LITHOTRIPSY    .  SIGMOIDOSCOPY  08/26/1993  . THYROID LOBECTOMY  2002   s/p XRT  . TOTAL HIP ARTHROPLASTY Right 10/24/2019   Procedure: TOTAL HIP ARTHROPLASTY;  Surgeon: Corky Mull, MD;  Location: [facility] ORS;  Service: Orthopedics;  Laterality: Right;    SOCIAL HISTORY:   Social History   Tobacco Use  . Smoking status: Never Smoker  . Smokeless tobacco: Never Used  Substance Use Topics  . Alcohol use: No    Alcohol/week: 0.0 standard drinks    FAMILY HISTORY:   Family History  Problem Relation Age of Onset  . Stroke Mother        72s  . Alzheimer's disease Mother   . Cancer Mother   . Lung cancer Father   . Prostate cancer Father   . Cancer Father        Colon  . Colon cancer Father   . Breast cancer Sister        60's  . Lung cancer Sister   . Breast cancer Maternal Aunt     DRUG ALLERGIES:   Allergies  Allergen Reactions  . Demeclocycline Other (See Comments)    Throat swells  . Tetracyclines & Related Other (See Comments)    Throat swells   . Augmentin [Amoxicillin-Pot Clavulanate] Diarrhea  . Bentyl [Dicyclomine Hcl]     unkn  . Ciprofloxacin Diarrhea  . Codeine Other (See Comments)    dizziness    . Epinephrine     Speeds heart up - panic   . Flagyl [Metronidazole] Nausea And Vomiting  . Librax [Chlordiazepoxide-Clidinium]     unkn  . Novocain [Procaine] Other (See Comments)    "Shaky"  . Phenobarbital     unkn  . Prednisone     Nervous    . Ultram [Tramadol] Other (See Comments)    Sick feeling    REVIEW OF SYSTEMS:   ROS As per history of present illness. All pertinent systems were reviewed above. Constitutional, HEENT, cardiovascular, respiratory, GI, GU, musculoskeletal, neuro, psychiatric, endocrine, integumentary and hematologic systems were reviewed and are otherwise negative/unremarkable except for positive findings mentioned above in the HPI.   MEDICATIONS AT HOME:   Prior to Admission medications   Medication Sig Start Date End Date  Taking? Authorizing Provider  Azelastine-Fluticasone [facility]) 137-50 MCG/ACT SUSP 2 sprays each nostril q day 02/01/20  Yes [name], MD  clonazePAM (KLONOPIN) 0.5 MG tablet Take 1.5 mg by mouth 2 (two) times daily.  09/06/17  Yes [provider]  diphenoxylate-atropine (LOMOTIL) 2.5-0.025 MG tablet Take 1 tablet by mouth 4 (four) times daily as needed for diarrhea or loose stools. 06/11/20  Yes Lloyd Huger, MD  escitalopram (LEXAPRO) 20 MG tablet Take 20 mg by mouth every morning.    Yes [provider]  hydrocortisone (ANUSOL-HC) 25 MG suppository Place 1 suppository (25 mg total) rectally 2 (  two) times daily. 06/10/20  Yes Burns, Wandra Feinstein, NP  levofloxacin (LEVAQUIN) 500 MG tablet Take 500 mg by mouth daily. 06/04/20  Yes [provider]  levothyroxine (SYNTHROID) 125 MCG tablet TAKE 1 TABLET BY MOUTH EVERY DAY 05/23/20  Yes [name], MD  loperamide (IMODIUM) 2 MG capsule Take 2 mg by mouth as needed.   Yes [provider]  magnesium oxide (MAG-OX) 400 MG tablet TAKE 1 TABLET BY MOUTH EVERY DAY 12/05/19  Yes [name], MD  Misc. Devices (SITZ BATH) MISC 10 Packages by Does not apply route in the morning, at noon, in the evening, and at bedtime. 06/14/20  Yes Burns, Wandra Feinstein, NP  ondansetron (ZOFRAN) 8 MG tablet Take 1 tablet (8 mg total) by mouth every 8 (eight) hours as needed for nausea or vomiting. 06/10/20  Yes Burns, Wandra Feinstein, NP  pantoprazole (PROTONIX) 40 MG tablet TAKE 1 TABLET BY MOUTH EVERY DAY 05/23/20  Yes [name], MD  potassium chloride SA (KLOR-CON) 20 MEQ tablet Take 1 tablet (20 mEq total) by mouth 2 (two) times daily. 06/10/20  Yes Jacquelin Hawking, NP  valACYclovir (VALTREX) 500 MG tablet Take 500 mg by mouth daily. 05/21/20  Yes [provider]  venetoclax (VENCLEXTA) 100 MG tablet Take 100 mg by mouth daily. Tablets should be swallowed whole with a meal and a full glass of water.   Yes [provider]       VITAL SIGNS:  Blood pressure 139/68, pulse 79, temperature 98.3 F (36.8 C), temperature source Oral, resp. rate 16, height 5\' 4"  (1.626 m), weight 62.6 kg, SpO2 100 %.  PHYSICAL EXAMINATION:  Physical Exam  GENERAL:  70 y.o.-year-old patient lying in the bed with no acute distress.  EYES: Pupils equal, round, reactive to light and accommodation. No scleral icterus. Extraocular muscles intact.  HEENT: Head atraumatic, normocephalic. Oropharynx and nasopharynx clear.  NECK:  Supple, no jugular venous distention. No thyroid enlargement, no tenderness.  LUNGS: Normal breath sounds bilaterally, no wheezing, rales,rhonchi or crepitation. No use of accessory muscles of respiration.  CARDIOVASCULAR: Regular rate and rhythm, S1, S2 normal. No murmurs, rubs, or gallops.  ABDOMEN: Soft, nondistended, nontender. Bowel sounds present. No organomegaly or mass.  EXTREMITIES: No pedal edema, cyanosis, or clubbing.  NEUROLOGIC: Cranial nerves II through XII are intact. Muscle strength 5/5 in all extremities. Sensation intact. Gait not checked.  PSYCHIATRIC: The patient is alert and oriented x 3.  Normal affect and good eye contact. SKIN: Left gluteal swelling with tenderness and mild fluctuation close to her gluteal cleft perianally.    LABORATORY PANEL:   CBC Recent Labs  Lab 06/16/20 2127  WBC 3.4*  HGB 11.8*  HCT 35.9*  PLT 237   ------------------------------------------------------------------------------------------------------------------  Chemistries  Recent Labs  Lab 06/14/20 1126 06/16/20 2127  NA 139 140  K 3.4* 3.3*  CL 104 102  CO2 27 27  GLUCOSE 114* 112*  BUN 10 10  CREATININE 0.98 0.88  CALCIUM 9.0 9.1  MG 1.9  --   AST 12* 10*  ALT 19 18  ALKPHOS 99 103  BILITOT 1.0 0.6   ------------------------------------------------------------------------------------------------------------------  Cardiac Enzymes No results for input(s): TROPONINI in the last 168  hours. ------------------------------------------------------------------------------------------------------------------  RADIOLOGY:  CT PELVIS W CONTRAST  Result Date: 06/16/2020 CLINICAL DATA:  Abscess, anal or rectal Patient reports pain since Friday. EXAM: CT PELVIS WITH CONTRAST TECHNIQUE: Multidetector CT imaging of the pelvis was performed using the standard protocol following the bolus administration of  intravenous contrast. CONTRAST:  14mL OMNIPAQUE IOHEXOL 300 MG/ML  SOLN COMPARISON:  Abdominopelvic CT 07/21/2017 FINDINGS: Urinary Tract: Urinary bladder is unremarkable. No bladder wall thickening. Bowel: Thick-walled peripherally enhancing a perirectal collection measures 3.3 x 2.7 x 2.9 cm. This is just to the left of midline with adjacent inflammatory changes extending along the left gluteal crease. No internal or soft tissue air. No inflammation of pelvic bowel loops. Surgical anastomosis noted in the central abdomen. Vascular/Lymphatic: No acute vascular findings. No enlarged lymph nodes. Reproductive:  Quiescent uterus and ovaries.  No adnexal mass. Other:  No pelvic free fluid.  No inguinal hernia. Musculoskeletal: Right hip arthroplasty with subsequent streak artifact. Left hip osteoarthritis. No focal bone lesion or acute osseous abnormality. IMPRESSION: Perirectal abscess measuring 3.3 x 2.7 x 2.9 cm just to the left of midline. Inflammatory changes extend inferiorly to involve the left gluteal crease. Electronically Signed   By: Keith Rake M.D.   On: 06/16/2020 22:31      IMPRESSION AND PLAN:  Active Problems:   Perirectal abscess  1.  Left perirectal abscess. -The patient is admitted to a surgical bed for I&D in a.m. -She has no history of diabetes mellitus on insulin, CHF, CVA, coronary artery disease, or kidney failure with a creatinine more than 2.  She is considered at average risk for her age for perioperative cardiovascular events.  She has no current pulmonary  issues. -Pain management will be provided. -I agree with IV Zosyn for her perirectal abscess.  She may benefit from IV vancomycin additionally.  2.  Hypokalemia. -We will replace her potassium and check magnesium level.  3.  Acute myeloid leukemia on active chemotherapy. -Oncology consultation will be obtained. -Her oral chemotherapy can be continued. -I notified Dr. Janese Banks about the patient  4.  Hypothyroidism. -We will check TSH and continue Synthroid.  5.  GERD. -PPI therapy can be resumed.  6.  Anxiety and depression. -We will continue her Klonopin and Lexapro.   DVT prophylaxis: Lovenox. Code Status: full code.  Family Communication:  The plan of care was discussed in details with the patient (and family). I answered all questions. The patient agreed to proceed with the above mentioned plan. Further management will depend upon hospital course. Disposition Plan: Back to previous home environment Consults called: Oncology consult to Dr. Janese Banks all the records are reviewed and case discussed with ED provider.  Status is: Observation  The patient remains OBS appropriate and will d/c before 2 midnights.  Dispo: The patient is from: Home              Anticipated d/c is to: Home              Anticipated d/c date is: 1 day              Patient currently is not medically stable to d/c.   Difficult to place patient No   Thank you Dr. Dahlia Byes for allowing me to participate in the care of this very pleasant lady.  We will follow the patient along with you.  TOTAL TIME TAKING CARE OF THIS PATIENT: 55 minutes.    Christel Mormon M.D on 06/16/2020 at 11:31 PM  [name]   From 7 PM-7 AM, contact night-coverage [email or web address]  CC: Primary care physician; [name], MD

## 2020-06-17 ENCOUNTER — Encounter
Admission: EM | Disposition: A | Discharge status: Home / Self Care | Payer: Self-pay | Source: Home / Self Care | Attending: Emergency Medicine

## 2020-06-17 ENCOUNTER — Inpatient Hospital Stay: Payer: PPO

## 2020-06-17 ENCOUNTER — Observation Stay: Payer: PPO | Admitting: Certified Registered Nurse Anesthetist

## 2020-06-17 ENCOUNTER — Encounter: Payer: Self-pay | Admitting: Surgery

## 2020-06-17 ENCOUNTER — Inpatient Hospital Stay: Payer: PPO | Admitting: Oncology

## 2020-06-17 ENCOUNTER — Telehealth: Payer: Self-pay | Admitting: Oncology

## 2020-06-17 DIAGNOSIS — B966 Bacteroides fragilis [B. fragilis] as the cause of diseases classified elsewhere: Secondary | ICD-10-CM | POA: Diagnosis not present

## 2020-06-17 DIAGNOSIS — K611 Rectal abscess: Secondary | ICD-10-CM | POA: Diagnosis not present

## 2020-06-17 DIAGNOSIS — C9590 Leukemia, unspecified not having achieved remission: Secondary | ICD-10-CM | POA: Diagnosis not present

## 2020-06-17 DIAGNOSIS — C9200 Acute myeloblastic leukemia, not having achieved remission: Secondary | ICD-10-CM

## 2020-06-17 HISTORY — PX: INCISION AND DRAINAGE PERIRECTAL ABSCESS: SHX1804

## 2020-06-17 LAB — MAGNESIUM: Magnesium: 1.8 mg/dL (ref 1.7–2.4)

## 2020-06-17 LAB — TSH: TSH: 0.093 u[IU]/mL — ABNORMAL LOW (ref 0.350–4.500)

## 2020-06-17 LAB — HIV ANTIBODY (ROUTINE TESTING W REFLEX): HIV Screen 4th Generation wRfx: NONREACTIVE

## 2020-06-17 SURGERY — INCISION AND DRAINAGE, ABSCESS, PERIRECTAL
Anesthesia: Choice

## 2020-06-17 MED ORDER — ROCURONIUM BROMIDE 100 MG/10ML IV SOLN
INTRAVENOUS | Status: DC | PRN
  Administered 2020-06-17: 20 mg via INTRAVENOUS
  Administered 2020-06-17: 10 mg via INTRAVENOUS

## 2020-06-17 MED ORDER — CHLORHEXIDINE GLUCONATE 0.12 % MT SOLN
OROMUCOSAL | Status: AC
  Filled 2020-06-17: qty 15

## 2020-06-17 MED ORDER — VANCOMYCIN HCL IN DEXTROSE 1-5 GM/200ML-% IV SOLN: 1000.0000 mg | Freq: Once | INTRAVENOUS | Status: DC

## 2020-06-17 MED ORDER — FENTANYL CITRATE (PF) 100 MCG/2ML IJ SOLN
25.0000 ug | INTRAMUSCULAR | Status: DC | PRN
  Administered 2020-06-17: 25 ug via INTRAVENOUS

## 2020-06-17 MED ORDER — FENTANYL CITRATE (PF) 100 MCG/2ML IJ SOLN
INTRAMUSCULAR | Status: DC | PRN
  Administered 2020-06-17 (×2): 25 ug via INTRAVENOUS
  Administered 2020-06-17: 50 ug via INTRAVENOUS

## 2020-06-17 MED ORDER — BUPIVACAINE LIPOSOME 1.3 % IJ SUSP
INTRAMUSCULAR | Status: DC | PRN
  Administered 2020-06-17: 20 mL

## 2020-06-17 MED ORDER — FENTANYL CITRATE (PF) 100 MCG/2ML IJ SOLN
INTRAMUSCULAR | Status: AC
  Filled 2020-06-17: qty 2

## 2020-06-17 MED ORDER — OXYCODONE-ACETAMINOPHEN 7.5-325 MG PO TABS: 2.0000 | ORAL_TABLET | Freq: Four times a day (QID) | ORAL | 0 refills | Status: DC | PRN

## 2020-06-17 MED ORDER — OXYCODONE HCL 5 MG PO TABS
ORAL_TABLET | ORAL | Status: AC
  Administered 2020-06-17: 5 mg via ORAL
  Filled 2020-06-17: qty 1

## 2020-06-17 MED ORDER — BUPIVACAINE-EPINEPHRINE (PF) 0.25% -1:200000 IJ SOLN
INTRAMUSCULAR | Status: AC
  Filled 2020-06-17: qty 30

## 2020-06-17 MED ORDER — DEXAMETHASONE SODIUM PHOSPHATE 10 MG/ML IJ SOLN
INTRAMUSCULAR | Status: DC | PRN
  Administered 2020-06-17: 10 mg via INTRAVENOUS

## 2020-06-17 MED ORDER — SUCCINYLCHOLINE CHLORIDE 20 MG/ML IJ SOLN
INTRAMUSCULAR | Status: DC | PRN
  Administered 2020-06-17: 100 mg via INTRAVENOUS

## 2020-06-17 MED ORDER — FENTANYL CITRATE (PF) 100 MCG/2ML IJ SOLN
INTRAMUSCULAR | Status: AC
  Administered 2020-06-17: 25 ug via INTRAVENOUS
  Filled 2020-06-17: qty 2

## 2020-06-17 MED ORDER — PROPOFOL 10 MG/ML IV BOLUS
INTRAVENOUS | Status: DC | PRN
  Administered 2020-06-17: 90 mg via INTRAVENOUS

## 2020-06-17 MED ORDER — TRAZODONE HCL 50 MG PO TABS: 25.0000 mg | ORAL_TABLET | Freq: Every evening | ORAL | Status: DC | PRN

## 2020-06-17 MED ORDER — LACTATED RINGERS IV SOLN: INTRAVENOUS | Status: DC

## 2020-06-17 MED ORDER — MIDAZOLAM HCL 2 MG/2ML IJ SOLN
INTRAMUSCULAR | Status: AC
  Filled 2020-06-17: qty 2

## 2020-06-17 MED ORDER — SUGAMMADEX SODIUM 200 MG/2ML IV SOLN
INTRAVENOUS | Status: DC | PRN
  Administered 2020-06-17: 200 mg via INTRAVENOUS

## 2020-06-17 MED ORDER — PIPERACILLIN-TAZOBACTAM 3.375 G IVPB
INTRAVENOUS | Status: AC
  Filled 2020-06-17: qty 50

## 2020-06-17 MED ORDER — LIDOCAINE HCL (CARDIAC) PF 100 MG/5ML IV SOSY
PREFILLED_SYRINGE | INTRAVENOUS | Status: DC | PRN
  Administered 2020-06-17: 60 mg via INTRAVENOUS

## 2020-06-17 MED ORDER — ONDANSETRON HCL 4 MG/2ML IJ SOLN: 4.0000 mg | Freq: Once | INTRAMUSCULAR | Status: DC | PRN

## 2020-06-17 MED ORDER — AMOXICILLIN-POT CLAVULANATE 875-125 MG PO TABS: 1.0000 | ORAL_TABLET | Freq: Two times a day (BID) | ORAL | 0 refills | Status: AC

## 2020-06-17 MED ORDER — VANCOMYCIN HCL 1250 MG/250ML IV SOLN
1250.0000 mg | INTRAVENOUS | Status: DC
  Administered 2020-06-17: 1250 mg via INTRAVENOUS
  Filled 2020-06-17 (×2): qty 250

## 2020-06-17 MED ORDER — ONDANSETRON HCL 4 MG/2ML IJ SOLN
INTRAMUSCULAR | Status: DC | PRN
  Administered 2020-06-17: 4 mg via INTRAVENOUS

## 2020-06-17 SURGICAL SUPPLY — 27 items
BLADE CLIPPER SURG (BLADE) IMPLANT
BLADE SURG 15 STRL LF DISP TIS (BLADE) ×1 IMPLANT
BLADE SURG 15 STRL SS (BLADE) ×2
BRUSH SCRUB EZ  4% CHG (MISCELLANEOUS) ×1
BRUSH SCRUB EZ 4% CHG (MISCELLANEOUS) ×1 IMPLANT
COVER WAND RF STERILE (DRAPES) ×2 IMPLANT
DRAPE LEGGINS SURG 28X43 STRL (DRAPES) ×2 IMPLANT
DRAPE UNDER BUTTOCK W/FLU (DRAPES) ×2 IMPLANT
ELECT CAUTERY BLADE 6.4 (BLADE) ×2 IMPLANT
ELECT REM PT RETURN 9FT ADLT (ELECTROSURGICAL) ×2
ELECTRODE REM PT RTRN 9FT ADLT (ELECTROSURGICAL) ×1 IMPLANT
GAUZE PACKING IODOFORM 1/2 (PACKING) ×2 IMPLANT
GLOVE SURG ENC MOIS LTX SZ7 (GLOVE) ×10 IMPLANT
GOWN STRL REUS W/ TWL LRG LVL3 (GOWN DISPOSABLE) ×3 IMPLANT
GOWN STRL REUS W/TWL LRG LVL3 (GOWN DISPOSABLE) ×6
MANIFOLD NEPTUNE II (INSTRUMENTS) ×2 IMPLANT
NEEDLE HYPO 22GX1.5 SAFETY (NEEDLE) ×4 IMPLANT
NS IRRIG 1000ML POUR BTL (IV SOLUTION) ×2 IMPLANT
PACK BASIN MINOR ARMC (MISCELLANEOUS) ×2 IMPLANT
PAD PREP 24X41 OB/GYN DISP (PERSONAL CARE ITEMS) ×2 IMPLANT
SOL PREP PVP 2OZ (MISCELLANEOUS) ×2
SOLUTION PREP PVP 2OZ (MISCELLANEOUS) ×1 IMPLANT
SPONGE LAP 18X18 RF (DISPOSABLE) ×2 IMPLANT
SURGILUBE 2OZ TUBE FLIPTOP (MISCELLANEOUS) ×2 IMPLANT
SWAB DUAL CULTURE TRANS RED ST (MISCELLANEOUS) ×2 IMPLANT
SYR 20ML LL LF (SYRINGE) ×2 IMPLANT
SYR BULB IRRIG 60ML STRL (SYRINGE) ×2 IMPLANT

## 2020-06-17 NOTE — Progress Notes (Signed)
Pharmacy Antibiotic Note  Debra Hale is a 70 y.o. female admitted on 06/16/2020 with cellulitis (left perirectal abscess).  Pharmacy has been consulted for Vancomycin dosing.  Pt with acute myeloid leukemia on active chemo tx.  Pt has med hx of breast & colon cancers, liver disease, hypothyroidism.  Plan: Vancomycin 1250 mg IV Q 24 hrs.  Goal AUC 400-550. Expected AUC: 521.4 SCr used: 0.88  Height: 5\' 4"  (162.6 cm) Weight: 62.6 kg (138 lb) IBW/kg (Calculated) : 54.7  Temp (24hrs), Avg:98.3 F (36.8 C), Min:98.3 F (36.8 C), Max:98.3 F (36.8 C)  Recent Labs  Lab 06/10/20 0915 06/14/20 1126 06/16/20 2127  WBC 1.7* 2.8* 3.4*  CREATININE 1.14* 0.98 0.88    Estimated Creatinine Clearance: 51.4 mL/min (by C-G formula based on SCr of 0.88 mg/dL).    Allergies  Allergen Reactions  . Demeclocycline Other (See Comments)    Throat swells  . Tetracyclines & Related Other (See Comments)    Throat swells   . Augmentin [Amoxicillin-Pot Clavulanate] Diarrhea  . Bentyl [Dicyclomine Hcl]     unkn  . Ciprofloxacin Diarrhea  . Codeine Other (See Comments)    dizziness    . Epinephrine     Speeds heart up - panic   . Flagyl [Metronidazole] Nausea And Vomiting  . Librax [Chlordiazepoxide-Clidinium]     unkn  . Novocain [Procaine] Other (See Comments)    "Shaky"  . Phenobarbital     unkn  . Prednisone     Nervous    . Ultram [Tramadol] Other (See Comments)    Sick feeling    Antimicrobials this admission: 02/14 Zosyn >>  02/14 Vanc >>   Microbiology results: No lab cx pending at this time.  Thank you for allowing pharmacy to be a part of this patient's care.  Renda Rolls, PharmD, [facility] 06/17/2020 3:24 AM

## 2020-06-17 NOTE — Discharge Instructions (Signed)

## 2020-06-17 NOTE — Progress Notes (Signed)
Per dr Dahlia Byes patient is to be discharged in post op. Does not need to be admitted.

## 2020-06-17 NOTE — Op Note (Signed)
  06/17/2020  1:33 PM  PATIENT:  Debra Hale  70 y.o. female  PRE-OPERATIVE DIAGNOSIS:  Left ischiorectal abscess  POST-OPERATIVE DIAGNOSIS:  Same  PROCEDURE: 1. Incision and drainage of complex ischiorectal abscess 2. Excisional debridement of skin subcutaneous tissue and muscle measuring 4 square centimeters    SURGEON:  Surgeon(s) and Role:    * ,  F, MD - Primary  ANESTHESIA: GETA  EBL: 5cc  FINDINGS: Left posterolateral ischiorectal abscess  DICTATION:  Patient was explained about the procedure in detail, risks, benefits. possible complications and a consent was obtained. The patient taken to the operating room and placed in the lithotomy position. incision was created and pus was drained and cultured. Exam revealed left posterolateral abscess. There were complex luculations that we were able to lyse with a combination of finger fracture and suction device. All the loculations were brokwen down. Using a sharp curette we debrided the sub q tissue down to the muscle. Hemostasis was obtained with electrocautery. Irrigation with normal saline and the wound was packed with half-inch packing. Liposomal Marcaine was injected around the wound site. Needle and laparotomy counts were correct and there were no immediate complications. Dressing applied  Jules Husbands, MD

## 2020-06-17 NOTE — H&P (Signed)
[facility] SURGICAL HISTORY & PHYSICAL (cpt [phone])  HISTORY OF PRESENT ILLNESS (HPI):  70 y.o. female presented to [facility] ED yesterday for rectal pain. Patient reports around a 4 day history of constat rectal pain. She thought initially she had hemorrhoids and tried preporation H and Advil without significant improvement. No fever, chills, cough, SOB, CP. She doe shave a history of leukemia and is currently undergoing chemotherapy, last cycle was 02/7 - 02/11. Work up in the ED was concerning for leukopenia to 3.4, mild hypokalemia to 3.3, and CT Pelvis was concerning for left sided peri-rectal abscess.   General surgery is consulted by emergency medicine physician Dr Harvest Dark, MD for evaluation and management of peri-rectal abscess.   PAST MEDICAL HISTORY (PMH):  Past Medical History:  Diagnosis Date  . Anxiety   . Arthritis    Osteoarthritis  . Breast cancer ([place]) 2009   right breast lumpectomy with rad tx  . Colon cancer ([place])    surgery with chemo and rad tx  . Complication of anesthesia   . GERD (gastroesophageal reflux disease)   . Heart palpitations   . History of hiatal hernia   . History of kidney stones   . Hypothyroidism   . Liver disease   . Liver nodule    s/p negative biopsy  . Malignant neoplasm of thyroid gland ([place]) 2002   s/p surgery and XRT  . Osteoporosis   . Other and unspecified hyperlipidemia   . Palpitations   . Personal history of chemotherapy   . Personal history of malignant neoplasm of large intestine    carcinoma - cecum, s/p right laparoscopic colectomy - s/p chemotherapy and XRT  . Personal history of radiation therapy   . Pneumonia 2019  . PONV (postoperative nausea and vomiting)   . Pure hypercholesterolemia   . Unspecified hereditary and idiopathic peripheral neuropathy     Reviewed. Otherwise negative.   PAST SURGICAL HISTORY ([place]):  Past Surgical History:  Procedure Laterality Date  . APPENDECTOMY  1985  .  BREAST BIOPSY Right 2009   positive  . BREAST BIOPSY Right 2009   negative  . BREAST LUMPECTOMY Right 2009   breast cancer  . CHOLECYSTECTOMY  1995  . COLONOSCOPY    . COLONOSCOPY WITH PROPOFOL N/A 10/29/2017   Procedure: COLONOSCOPY WITH PROPOFOL;  Surgeon: Manya Silvas, MD;  Location[facility] ENDOSCOPY;  Service: Endoscopy;  Laterality: N/A;  . DILATION AND CURETTAGE OF UTERUS  1990  . DILATION AND CURETTAGE, DIAGNOSTIC / THERAPEUTIC  1990  . ESOPHAGOGASTRODUODENOSCOPY    . ESOPHAGOGASTRODUODENOSCOPY (EGD) WITH PROPOFOL N/A 10/29/2017   Procedure: ESOPHAGOGASTRODUODENOSCOPY (EGD) WITH PROPOFOL;  Surgeon: Manya Silvas, MD;  Location: [facility] ENDOSCOPY;  Service: Endoscopy;  Laterality: N/A;  . LAPAROSCOPIC PARTIAL COLECTOMY     stage 3-C carcinoma of the cecum, s/p chemotherapy and xrt  . LITHOTRIPSY    . SIGMOIDOSCOPY  08/26/1993  . THYROID LOBECTOMY  2002   s/p XRT  . TOTAL HIP ARTHROPLASTY Right 10/24/2019   Procedure: TOTAL HIP ARTHROPLASTY;  Surgeon: Corky Mull, MD;  Location: [facility] ORS;  Service: Orthopedics;  Laterality: Right;    Reviewed. Otherwise negative.   MEDICATIONS:  Prior to Admission medications   Medication Sig Start Date End Date Taking? Authorizing Provider  Azelastine-Fluticasone [facility]) 137-50 MCG/ACT SUSP 2 sprays each nostril q day 02/01/20  Yes [name], MD  clonazePAM (KLONOPIN) 0.5 MG tablet Take 1.5 mg by mouth 2 (two) times daily.  09/06/17  Yes  [provider]  diphenoxylate-atropine (LOMOTIL) 2.5-0.025 MG tablet Take 1 tablet by mouth 4 (four) times daily as needed for diarrhea or loose stools. 06/11/20  Yes Lloyd Huger, MD  escitalopram (LEXAPRO) 20 MG tablet Take 20 mg by mouth every morning.    Yes [provider]  hydrocortisone (ANUSOL-HC) 25 MG suppository Place 1 suppository (25 mg total) rectally 2 (two) times daily. 06/10/20  Yes Burns, Wandra Feinstein, NP  levofloxacin (LEVAQUIN) 500 MG tablet Take 500 mg by mouth  daily. 06/04/20  Yes [provider]  levothyroxine (SYNTHROID) 125 MCG tablet TAKE 1 TABLET BY MOUTH EVERY DAY 05/23/20  Yes [name], MD  loperamide (IMODIUM) 2 MG capsule Take 2 mg by mouth as needed.   Yes [provider]  magnesium oxide (MAG-OX) 400 MG tablet TAKE 1 TABLET BY MOUTH EVERY DAY 12/05/19  Yes [name], MD  Misc. Devices (SITZ BATH) MISC 10 Packages by Does not apply route in the morning, at noon, in the evening, and at bedtime. 06/14/20  Yes Burns, Wandra Feinstein, NP  ondansetron (ZOFRAN) 8 MG tablet Take 1 tablet (8 mg total) by mouth every 8 (eight) hours as needed for nausea or vomiting. 06/10/20  Yes Burns, Wandra Feinstein, NP  pantoprazole (PROTONIX) 40 MG tablet TAKE 1 TABLET BY MOUTH EVERY DAY 05/23/20  Yes [name], MD  potassium chloride SA (KLOR-CON) 20 MEQ tablet Take 1 tablet (20 mEq total) by mouth 2 (two) times daily. 06/10/20  Yes Jacquelin Hawking, NP  valACYclovir (VALTREX) 500 MG tablet Take 500 mg by mouth daily. 05/21/20  Yes [provider]  venetoclax (VENCLEXTA) 100 MG tablet Take 100 mg by mouth daily. Tablets should be swallowed whole with a meal and a full glass of water.   Yes [provider]     ALLERGIES:  Allergies  Allergen Reactions  . Demeclocycline Other (See Comments)    Throat swells  . Tetracyclines & Related Other (See Comments)    Throat swells   . Augmentin [Amoxicillin-Pot Clavulanate] Diarrhea  . Bentyl [Dicyclomine Hcl]     unkn  . Ciprofloxacin Diarrhea  . Codeine Other (See Comments)    dizziness    . Epinephrine     Speeds heart up - panic   . Flagyl [Metronidazole] Nausea And Vomiting  . Librax [Chlordiazepoxide-Clidinium]     unkn  . Novocain [Procaine] Other (See Comments)    "Shaky"  . Phenobarbital     unkn  . Prednisone     Nervous    . Ultram [Tramadol] Other (See Comments)    Sick feeling     SOCIAL HISTORY:  Social History   Socioeconomic History  . Marital  status: Single    Spouse name: Not on file  . Number of children: 0  . Years of education: Not on file  . Highest education level: Not on file  Occupational History  . Not on file  Tobacco Use  . Smoking status: Never Smoker  . Smokeless tobacco: Never Used  Vaping Use  . Vaping Use: Never used  Substance and Sexual Activity  . Alcohol use: No    Alcohol/week: 0.0 standard drinks  . Drug use: No  . Sexual activity: Not Currently  Other Topics Concern  . Not on file  Social History Narrative  . Not on file   Social Determinants of Health   Financial Resource Strain: Not on file  Food Insecurity: Not on file  Transportation Needs: Not on  file  Physical Activity: Not on file  Stress: Not on file  Social Connections: Not on file  Intimate Partner Violence: Not on file     FAMILY HISTORY:  Family History  Problem Relation Age of Onset  . Stroke Mother        5s  . Alzheimer's disease Mother   . Cancer Mother   . Lung cancer Father   . Prostate cancer Father   . Cancer Father        Colon  . Colon cancer Father   . Breast cancer Sister        50's  . Lung cancer Sister   . Breast cancer Maternal Aunt     Otherwise negative.   REVIEW OF SYSTEMS:  Review of Systems  Constitutional: Negative for chills and fever.  Respiratory: Negative for cough and shortness of breath.   Cardiovascular: Negative for chest pain and palpitations.  Gastrointestinal: Negative for abdominal pain, nausea and vomiting.       + Rectal Pain   Genitourinary: Negative for dysuria and urgency.  All other systems reviewed and are negative.   VITAL SIGNS:  Temp:  [98.3 F (36.8 C)] 98.3 F (36.8 C) (02/13 2013) Pulse Rate:  [67-99] 71 (02/14 0645) Resp:  [13-20] 15 (02/14 0645) BP: (112-148)/(56-82) 113/62 (02/14 0600) SpO2:  [94 %-100 %] 95 % (02/14 0645) Weight:  [62.6 kg] 62.6 kg (02/13 2014)     Height: 5\' 4"  (162.6 cm) Weight: 62.6 kg BMI (Calculated): 23.68   PHYSICAL EXAM:   Physical Exam Vitals and nursing note reviewed. Exam conducted with a chaperone present.  Constitutional:      General: She is not in acute distress.    Appearance: Normal appearance. She is normal weight. She is not ill-appearing.  HENT:     Head: Normocephalic and atraumatic.  Eyes:     General: No scleral icterus.    Conjunctiva/sclera: Conjunctivae normal.  Pulmonary:     Effort: Pulmonary effort is normal. No respiratory distress.  Genitourinary:   Skin:    General: Skin is warm and dry.     Coloration: Skin is not pale.  Neurological:     General: No focal deficit present.     Mental Status: She is alert and oriented to person, place, and time.  Psychiatric:        Mood and Affect: Mood normal.        Behavior: Behavior normal.     INTAKE/OUTPUT:  This shift: No intake/output data recorded.  Last 2 shifts: @IOLAST2SHIFTS @  Labs:  CBC Latest Ref Rng & Units 06/16/2020 06/14/2020 06/10/2020  WBC 4.0 - 10.5 K/uL 3.4(L) 2.8(L) 1.7(L)  Hemoglobin 12.0 - 15.0 g/dL 11.8(L) 12.5 13.9  Hematocrit 36.0 - 46.0 % 35.9(L) 37.0 40.6  Platelets 150 - 400 K/uL 237 201 175   CMP Latest Ref Rng & Units 06/16/2020 06/14/2020 06/10/2020  Glucose 70 - 99 mg/dL 112(H) 114(H) 117(H)  BUN 8 - 23 mg/dL 10 10 18   Creatinine 0.44 - 1.00 mg/dL 0.88 0.98 1.14(H)  Sodium 135 - 145 mmol/L 140 139 142  Potassium 3.5 - 5.1 mmol/L 3.3(L) 3.4(L) 2.7(LL)  Chloride 98 - 111 mmol/L 102 104 105  CO2 22 - 32 mmol/L 27 27 27   Calcium 8.9 - 10.3 mg/dL 9.1 9.0 8.5(L)  Total Protein 6.5 - 8.1 g/dL 6.8 6.7 7.2  Total Bilirubin 0.3 - 1.2 mg/dL 0.6 1.0 0.8  Alkaline Phos 38 - 126 U/L 103 99 107  AST  15 - 41 U/L 10(L) 12(L) 17  ALT 0 - 44 U/L 18 19 25      Imaging studies:   CT Pelvis (06/16/2020) personally reviewed showing rim-enhancing fluid collection to the left of the rectum concerning for abscess, and radiologist report reviewed below:  IMPRESSION: Perirectal abscess measuring 3.3 x 2.7 x 2.9 cm  just to the left of midline. Inflammatory changes extend inferiorly to involve the left gluteal crease.   Assessment/Plan: (ICD-10's: K61.1) 70 y.o. female with rectal pain and perirectal abscess, complicated by pertinent comorbidities including leukemia undergoing chemotherapy.    - Will plan on I&D in the OR with Dr Dahlia Byes pending OR/Anesthesia availability   - A/all risks, benefits, and alternatives to above procedure(s) were discussed with the patient, all of her questions were answered to her expressed satisfaction, patient expresses she wishes to proceed, and informed consent was obtained.  - NPO + IVF Resuscitation  - IV ABx (Zosyn)  - Pain control prn   - DVT prophylaxis; hold for OR  All of the above findings and recommendations were discussed with the patient, and all of her questions were answered to her expressed satisfaction.  -- Edison Simon, PA-C [facility] 06/17/2020, 7:22 AM [phone] M-F: 7am - 4pm

## 2020-06-17 NOTE — Progress Notes (Signed)
PHARMACY CONSULT NOTE - FOLLOW UP  Pharmacy Consult for Electrolyte Monitoring and Replacement   Recent Labs: Potassium (mmol/L)  Date Value  06/16/2020 3.3 (L)  11/30/2013 3.5   Magnesium (mg/dL)  Date Value  06/16/2020 1.8   Calcium (mg/dL)  Date Value  06/16/2020 9.1   Calcium, Total (mg/dL)  Date Value  11/30/2013 8.7   Albumin (g/dL)  Date Value  06/16/2020 3.3 (L)  11/30/2013 3.3 (L)   Sodium (mmol/L)  Date Value  06/16/2020 140  05/09/2018 143  11/30/2013 145     Assessment: 70 year old female with left ischiorectal abscess s/p I&D and excisional debridement of subcutaneous tissue. Pharmacy consult to replace electrolytes.  Goal of Therapy:  Electrolytes WNL  Plan:  Patient received 60 mEq potassium replacement last night for K 3.3. Follow up with morning labs.  Tawnya Crook ,PharmD Clinical Pharmacist 06/17/2020 3:58 PM

## 2020-06-17 NOTE — Transfer of Care (Signed)
Immediate Anesthesia Transfer of Care Note  Patient: Debra Hale  Procedure(s) Performed: IRRIGATION AND DEBRIDEMENT PERIRECTAL ABSCESS (N/A )  Patient Location: PACU  Anesthesia Type:General  Level of Consciousness: awake, alert  and patient cooperative  Airway & Oxygen Therapy: Patient Spontanous Breathing and Patient connected to face mask oxygen  Post-op Assessment: Report given to RN and Post -op Vital signs reviewed and stable  Post vital signs: Reviewed and stable  Last Vitals:  Vitals Value Taken Time  BP 139/57 06/17/20 1353  Temp    Pulse 90 06/17/20 1357  Resp 14 06/17/20 1357  SpO2 100 % 06/17/20 1357  Vitals shown include unvalidated device data.  Last Pain:  Vitals:   06/17/20 1219  TempSrc: Temporal  PainSc: 0-No pain         Complications: No complications documented.

## 2020-06-17 NOTE — Consult Note (Signed)
Patient unable to be evaluated.  Currently in the OR for drainage of perirectal abscess.  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  She received her first cycle of induction chemotherapy at [facility] using subcutaneous Vidaza and p.o. venetoclax.  She received part of cycle 2 of Vidaza last week Monday through Friday.  Day 6 and 7 of treatment were supposed to occur today and tomorrow, but these have been canceled given her underlying abscess.  It appears patient does not require admission, therefore please instruct her to continue venetoclax as prescribed.  Will arrange follow-up in the cancer center next week for laboratory work and hospital follow-up.  Full consult to follow if patient is admitted.

## 2020-06-17 NOTE — Anesthesia Preprocedure Evaluation (Signed)
Anesthesia Evaluation  Patient identified by MRN, date of birth, ID band Patient awake    Reviewed: Allergy & Precautions, NPO status , Patient's Chart, lab work & pertinent test results  History of Anesthesia Complications (+) PONV and history of anesthetic complications  Airway Mallampati: II  TM Distance: >3 FB     Dental   Pulmonary neg pulmonary ROS,    Pulmonary exam normal        Cardiovascular negative cardio ROS Normal cardiovascular exam+ dysrhythmias      Neuro/Psych  Headaches, PSYCHIATRIC DISORDERS Anxiety Depression  Neuromuscular disease    GI/Hepatic hiatal hernia, GERD  ,  Endo/Other  Hypothyroidism   Renal/GU   negative genitourinary   Musculoskeletal  (+) Arthritis ,   Abdominal Normal abdominal exam  (+)   Peds negative pediatric ROS (+)  Hematology negative hematology ROS (+) anemia ,   Anesthesia Other Findings Past Medical History: No date: Anxiety No date: Arthritis     Comment:  Osteoarthritis 2009: Breast cancer ([place])     Comment:  right breast lumpectomy with rad tx No date: Colon cancer ([place])     Comment:  surgery with chemo and rad tx No date: GERD (gastroesophageal reflux disease) No date: Hypothyroidism No date: Liver disease No date: Liver nodule     Comment:  s/p negative biopsy 2002: Malignant neoplasm of thyroid gland ([facility])     Comment:  s/p surgery and XRT No date: Nephrolithiasis     Comment:  s/p lithotripsy No date: Osteoporosis No date: Other and unspecified hyperlipidemia No date: Palpitations No date: Personal history of chemotherapy No date: Personal history of malignant neoplasm of large intestine     Comment:  carcinoma - cecum, s/p right laparoscopic colectomy -               s/p chemotherapy and XRT No date: Personal history of radiation therapy No date: Pure hypercholesterolemia No date: Unspecified hereditary and idiopathic peripheral neuropathy   Reproductive/Obstetrics                             Anesthesia Physical  Anesthesia Plan  ASA: II  Anesthesia Plan:    Post-op Pain Management:    Induction: Intravenous  PONV Risk Score and Plan:   Airway Management Planned: Oral ETT  Additional Equipment:   Intra-op Plan:   Post-operative Plan: Extubation in OR  Informed Consent: I have reviewed the patients History and Physical, chart, labs and discussed the procedure including the risks, benefits and alternatives for the proposed anesthesia with the patient or authorized representative who has indicated his/her understanding and acceptance.     Dental advisory given  Plan Discussed with: CRNA and Surgeon  Anesthesia Plan Comments:         Anesthesia Quick Evaluation

## 2020-06-17 NOTE — Anesthesia Procedure Notes (Signed)
Procedure Name: Intubation Date/Time: 06/17/2020 12:54 PM Performed by: Lily Peer, , CRNA Pre-anesthesia Checklist: Patient identified, Emergency Drugs available, Suction available and Patient being monitored Patient Re-evaluated:Patient Re-evaluated prior to induction Oxygen Delivery Method: Circle system utilized Preoxygenation: Pre-oxygenation with 100% oxygen Induction Type: IV induction Ventilation: Mask ventilation without difficulty Laryngoscope Size: McGraph and 3 Grade View: Grade I Tube type: Oral Tube size: 7.0 mm Number of attempts: 1 Airway Equipment and Method: Stylet Placement Confirmation: ETT inserted through vocal cords under direct vision,  positive ETCO2 and breath sounds checked- equal and bilateral Secured at: 20 cm Tube secured with: Tape Dental Injury: Teeth and Oropharynx as per pre-operative assessment

## 2020-06-17 NOTE — Telephone Encounter (Signed)
06/17/2020 Per Dr. Woodfin Ganja, appts for today cxl and r/s for 06/26/20 @ 9:15. Spoke with pt contact Teressa Senter, and gave her updated appt info. She wrote it down and requested I call pts phone and leave a VM just in case. Unable to leave vm as mailbox is full  SRW

## 2020-06-18 ENCOUNTER — Encounter: Payer: Self-pay | Admitting: Surgery

## 2020-06-18 ENCOUNTER — Telehealth: Payer: Self-pay

## 2020-06-18 ENCOUNTER — Ambulatory Visit: Payer: PPO

## 2020-06-18 ENCOUNTER — Inpatient Hospital Stay: Payer: PPO

## 2020-06-18 NOTE — Telephone Encounter (Signed)
Patient's friend called and is confused as to what the should be doing post operatively for her wound. She was not given any instructions as to what she should be doing no was she given any supplies for this. She had an incision and drainage of a peri rectal abscess and will need to have this area packed daily per Edison Simon PA-C. She said she has bad diarrhea from Augmentin that was prescribed. Per Edison Simon PA-C she may take Imodium and a probiotic to offset the side effects as she is allergic to many antibiotics. She will come in tomorrow to have the area repacked and have her friend instructed on how to do this.

## 2020-06-18 NOTE — Anesthesia Postprocedure Evaluation (Signed)
Anesthesia Post Note  Patient: Debra Hale  Procedure(s) Performed: IRRIGATION AND DEBRIDEMENT PERIRECTAL ABSCESS (N/A )  Patient location during evaluation: PACU Anesthesia Type: General Level of consciousness: awake and alert and oriented Pain management: pain level controlled Vital Signs Assessment: post-procedure vital signs reviewed and stable Respiratory status: spontaneous breathing Cardiovascular status: blood pressure returned to baseline Anesthetic complications: no   No complications documented.   Last Vitals:  Vitals:   06/17/20 1430 06/17/20 1450  BP: 127/80 137/74  Pulse: 88 91  Resp: 13 20  Temp:  36.6 C  SpO2: 92% 96%    Last Pain:  Vitals:   06/17/20 1549  TempSrc:   PainSc: 8                  ,

## 2020-06-19 ENCOUNTER — Emergency Department
Admission: EM | Admit: 2020-06-19 | Discharge: 2020-06-19 | Disposition: A | Discharge status: Home / Self Care | Payer: PPO | Attending: Emergency Medicine | Admitting: Emergency Medicine

## 2020-06-19 ENCOUNTER — Other Ambulatory Visit: Payer: Self-pay

## 2020-06-19 ENCOUNTER — Ambulatory Visit (INDEPENDENT_AMBULATORY_CARE_PROVIDER_SITE_OTHER): Payer: PPO

## 2020-06-19 DIAGNOSIS — A09 Infectious gastroenteritis and colitis, unspecified: Secondary | ICD-10-CM

## 2020-06-19 DIAGNOSIS — R197 Diarrhea, unspecified: Secondary | ICD-10-CM | POA: Insufficient documentation

## 2020-06-19 DIAGNOSIS — Z79899 Other long term (current) drug therapy: Secondary | ICD-10-CM | POA: Insufficient documentation

## 2020-06-19 DIAGNOSIS — Z85038 Personal history of other malignant neoplasm of large intestine: Secondary | ICD-10-CM | POA: Diagnosis not present

## 2020-06-19 DIAGNOSIS — R531 Weakness: Secondary | ICD-10-CM

## 2020-06-19 DIAGNOSIS — E876 Hypokalemia: Secondary | ICD-10-CM | POA: Diagnosis not present

## 2020-06-19 DIAGNOSIS — Z853 Personal history of malignant neoplasm of breast: Secondary | ICD-10-CM | POA: Insufficient documentation

## 2020-06-19 DIAGNOSIS — Z96641 Presence of right artificial hip joint: Secondary | ICD-10-CM | POA: Diagnosis not present

## 2020-06-19 DIAGNOSIS — Z85850 Personal history of malignant neoplasm of thyroid: Secondary | ICD-10-CM | POA: Diagnosis not present

## 2020-06-19 DIAGNOSIS — R2689 Other abnormalities of gait and mobility: Secondary | ICD-10-CM | POA: Diagnosis not present

## 2020-06-19 DIAGNOSIS — E039 Hypothyroidism, unspecified: Secondary | ICD-10-CM | POA: Insufficient documentation

## 2020-06-19 DIAGNOSIS — K611 Rectal abscess: Secondary | ICD-10-CM

## 2020-06-19 DIAGNOSIS — Z9889 Other specified postprocedural states: Secondary | ICD-10-CM | POA: Insufficient documentation

## 2020-06-19 LAB — CBC
HCT: 37.3 % (ref 36.0–46.0)
Hemoglobin: 12.2 g/dL (ref 12.0–15.0)
MCH: 27.4 pg (ref 26.0–34.0)
MCHC: 32.7 g/dL (ref 30.0–36.0)
MCV: 83.6 fL (ref 80.0–100.0)
Platelets: 275 10*3/uL (ref 150–400)
RBC: 4.46 MIL/uL (ref 3.87–5.11)
RDW: 16.4 % — ABNORMAL HIGH (ref 11.5–15.5)
WBC: 3.4 10*3/uL — ABNORMAL LOW (ref 4.0–10.5)
nRBC: 0 % (ref 0.0–0.2)

## 2020-06-19 LAB — BASIC METABOLIC PANEL
Anion gap: 11 (ref 5–15)
BUN: 12 mg/dL (ref 8–23)
CO2: 27 mmol/L (ref 22–32)
Calcium: 9 mg/dL (ref 8.9–10.3)
Chloride: 103 mmol/L (ref 98–111)
Creatinine, Ser: 0.9 mg/dL (ref 0.44–1.00)
GFR, Estimated: >60 mL/min (ref >60)
Glucose, Bld: 108 mg/dL — ABNORMAL HIGH (ref 70–99)
Potassium: 3 mmol/L — ABNORMAL LOW (ref 3.5–5.1)
Sodium: 141 mmol/L (ref 135–145)

## 2020-06-19 LAB — URINALYSIS, COMPLETE (UACMP) WITH MICROSCOPIC
Bacteria, UA: NONE SEEN
Bilirubin Urine: NEGATIVE
Glucose, UA: NEGATIVE mg/dL
Ketones, ur: 5 mg/dL — AB
Leukocytes,Ua: NEGATIVE
Nitrite: NEGATIVE
Protein, ur: 30 mg/dL — AB
Specific Gravity, Urine: 1.016 (ref 1.005–1.030)
pH: 5 (ref 5.0–8.0)

## 2020-06-19 LAB — LACTIC ACID, PLASMA: Lactic Acid, Venous: 1.1 mmol/L (ref 0.5–1.9)

## 2020-06-19 MED ORDER — POTASSIUM CHLORIDE CRYS ER 20 MEQ PO TBCR
40.0000 meq | EXTENDED_RELEASE_TABLET | Freq: Once | ORAL | Status: AC
  Administered 2020-06-19: 40 meq via ORAL
  Filled 2020-06-19: qty 2

## 2020-06-19 MED ORDER — LOPERAMIDE HCL 2 MG PO CAPS
2.0000 mg | ORAL_CAPSULE | ORAL | Status: DC | PRN
  Administered 2020-06-19 (×2): 2 mg via ORAL
  Filled 2020-06-19: qty 1

## 2020-06-19 MED ORDER — LOPERAMIDE HCL 1 MG/7.5ML PO SUSP: 2.0000 mg | ORAL | Status: DC | PRN

## 2020-06-19 MED ORDER — LOPERAMIDE HCL 1 MG/5ML PO LIQD: 2.0000 mg | ORAL | 0 refills | Status: DC | PRN

## 2020-06-19 MED ORDER — SODIUM CHLORIDE 0.9 % IV BOLUS
500.0000 mL | Freq: Once | INTRAVENOUS | Status: AC
  Administered 2020-06-19: 500 mL via INTRAVENOUS

## 2020-06-19 NOTE — ED Notes (Addendum)
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 Pt in NAD. Friend at bedside. Pt is cancer pt. Has been getting chemo since 05/03/20. Does not have port.

## 2020-06-19 NOTE — ED Notes (Signed)
Per EDP pt will discharge after IVF infused.

## 2020-06-19 NOTE — Discharge Instructions (Signed)
Please seek medical attention for any high fevers, chest pain, shortness of breath, change in behavior, persistent vomiting, bloody stool or any other new or concerning symptoms.  

## 2020-06-19 NOTE — Patient Instructions (Addendum)
  We removed the packing. You do nit need to repack the wound. Please try to clean yourself after each bowel movement.  Please keep your follow up appointment with Dr.Finnegan tomorrow.  Please call our office if you have any questions or concerns.

## 2020-06-19 NOTE — ED Triage Notes (Addendum)
Reports weakness and black diarrhea starting today. Diarrhea since leaving the hospital Monday. Pt had recent "cyst" procedure that had packing removed today. Describes procedure as a rectal cyst.  immunocompromised and AMS per friend

## 2020-06-19 NOTE — ED Notes (Addendum)
Warm blanket provided.

## 2020-06-19 NOTE — ED Notes (Signed)
EDP at bedside. Stool occult blood test performed. Reminded pt to keep arm straight for IVF to infuse.

## 2020-06-19 NOTE — Discharge Summary (Addendum)
Patient ID: MARREN LULL MRN: [id number] DOB/AGE: 1950/08/18 70 y.o.  Admit date: 06/16/2020 Discharge date: 06/17/2020  Discharge Diagnoses:  Active Problems:   Perirectal abscess   Procedures: Incision and drainage of left ischio rectal abscess  Hospital Course:  70 year old female with a history of leukemia AML actively receiving chemotherapy came into the emergency room complaining of anorectal pain. She also had diarrhea prior to admission.  Physical exam and CT findings consistent with ischio rectal abscess.  She was admitted to the hospital placed on IV antibiotics and underwent formal incision and drainage of perirectal abscess in the OR. At The time of discharge the patient was ambulating,  pain was controlled.  Her vital signs were stable and she was afebrile.   Physical exam at discharge showed a pt  in no acute distress.  Awake and alert.  Abdomen: Soft incisions healing well without infection or peritonitis.  Extremities well-perfused and no edema.  Condition of the patient the time of discharge was stable    Consults: hospitalist  Disposition: Discharge disposition: 01-Home or Self Care       Discharge Instructions    Call MD for:  difficulty breathing, headache or visual disturbances   Complete by: As directed    Call MD for:  extreme fatigue   Complete by: As directed    Call MD for:  hives   Complete by: As directed    Call MD for:  persistant dizziness or light-headedness   Complete by: As directed    Call MD for:  persistant nausea and vomiting   Complete by: As directed    Call MD for:  redness, tenderness, or signs of infection (pain, swelling, redness, odor or green/yellow discharge around incision site)   Complete by: As directed    Call MD for:  severe uncontrolled pain   Complete by: As directed    Call MD for:  temperature >100.4   Complete by: As directed    Diet - low sodium heart healthy   Complete by: As directed    Discharge instructions    Complete by: As directed    Please teach pt about removing of her packing tomorrow. May do dry dressing BID   Increase activity slowly   Complete by: As directed    Remove dressing in 24 hours   Complete by: As directed      Allergies as of 06/17/2020      Reactions   Demeclocycline Other (See Comments)   Throat swells   Tetracyclines & Related Other (See Comments)   Throat swells   Augmentin [amoxicillin-pot Clavulanate] Diarrhea   Bentyl [dicyclomine Hcl]    unkn   Ciprofloxacin Diarrhea   Codeine Other (See Comments)   dizziness   Epinephrine    Speeds heart up - panic    Flagyl [metronidazole] Nausea And Vomiting   Librax [chlordiazepoxide-clidinium]    unkn   Novocain [procaine] Other (See Comments)   "Shaky"   Phenobarbital    unkn   Prednisone    Nervous    Ultram [tramadol] Other (See Comments)   Sick feeling      Medication List    TAKE these medications   amoxicillin-clavulanate 875-125 MG tablet Commonly known as: Augmentin Take 1 tablet by mouth 2 (two) times daily for 10 days.   Azelastine-Fluticasone 137-50 MCG/ACT Susp Commonly known as: Dymista 2 sprays each nostril q day   clonazePAM 0.5 MG tablet Commonly known as: KLONOPIN Take 1.5 mg by mouth 2 (two)  times daily.   diphenoxylate-atropine 2.5-0.025 MG tablet Commonly known as: LOMOTIL Take 1 tablet by mouth 4 (four) times daily as needed for diarrhea or loose stools.   levofloxacin 500 MG tablet Commonly known as: LEVAQUIN Take 500 mg by mouth daily.   levothyroxine 125 MCG tablet Commonly known as: SYNTHROID TAKE 1 TABLET BY MOUTH EVERY DAY   Lexapro 20 MG tablet Generic drug: escitalopram Take 20 mg by mouth every morning.   loperamide 2 MG capsule Commonly known as: IMODIUM Take 2 mg by mouth as needed.   magnesium oxide 400 MG tablet Commonly known as: MAG-OX TAKE 1 TABLET BY MOUTH EVERY DAY   ondansetron 8 MG tablet Commonly known as: Zofran Take 1 tablet (8 mg total)  by mouth every 8 (eight) hours as needed for nausea or vomiting.   oxyCODONE-acetaminophen 7.5-325 MG tablet Commonly known as: Percocet Take 2 tablets by mouth every 6 (six) hours as needed for severe pain.   pantoprazole 40 MG tablet Commonly known as: PROTONIX TAKE 1 TABLET BY MOUTH EVERY DAY   potassium chloride SA 20 MEQ tablet Commonly known as: KLOR-CON Take 1 tablet (20 mEq total) by mouth 2 (two) times daily.   Sitz Bath Misc 10 Packages by Does not apply route in the morning, at noon, in the evening, and at bedtime.   valACYclovir 500 MG tablet Commonly known as: VALTREX Take 500 mg by mouth daily.   venetoclax 100 MG tablet Commonly known as: VENCLEXTA Take 100 mg by mouth daily. Tablets should be swallowed whole with a meal and a full glass of water.       Follow-up Information    Adom Schoeneck, [place] F, MD Follow up in 3 week(s).   Specialty: General Surgery Why: Follow up appointment on Wednesday March 9th at 2:30 pm. Contact information: [address] [place] [address] [phone]                Sterling Big, MD FACS

## 2020-06-19 NOTE — ED Notes (Signed)
EDP at bedside  

## 2020-06-19 NOTE — ED Triage Notes (Signed)
Pt comes with c/o increased weakness and some post op bleeding. Pt states she is currently getting chemo as well.

## 2020-06-19 NOTE — ED Notes (Addendum)
Patient incontinent of small diarrhea bowel movement. Patient cleaned and repositioned.

## 2020-06-19 NOTE — ED Provider Notes (Signed)
[facility] Emergency Department Provider Note   ____________________________________________   I have reviewed the triage vital signs and the nursing notes.   HISTORY  Chief Complaint Diarrhea  History limited by: Not Limited   HPI Debra Hale is a 70 y.o. female who presents to the emergency department today at the advice of the cancer center. Patient however is unsure why they felt she needed to come in today rather than wait until her appointment tomorrow. Friend who is at bedside states that perhaps they were concerned she was dehydrated. She has been having diarrhea since a recent hospitalization for a perirectal abscess. 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 She says that she has not felt particularly weak over the past few days. She does have some gait instability but says that has been present since a surgery a number of months ago. She did have one episode of very dark and brown diarrhea, and cancer center was made aware of that, they did however they were not told that perhaps the cancer center was concerned for a GI bleed.   Records reviewed. Per medical record review patient has a history of recent admission for perirectal abscess. Saw surgery as an outpatient earlier today.   Past Medical History:  Diagnosis Date  . Anxiety   . Arthritis    Osteoarthritis  . Breast cancer ([place]) 2009   right breast lumpectomy with rad tx  . Colon cancer ([place])    surgery with chemo and rad tx  . Complication of anesthesia   . GERD (gastroesophageal reflux disease)   . Heart palpitations   . History of hiatal hernia   . History of kidney stones   . Hypothyroidism   . Liver disease   . Liver nodule    s/p negative biopsy  . Malignant neoplasm of thyroid gland ([place]) 2002   s/p surgery and XRT  . Osteoporosis   . Other and unspecified hyperlipidemia   . Palpitations   . Personal history of chemotherapy    . Personal history of malignant neoplasm of large intestine    carcinoma - cecum, s/p right laparoscopic colectomy - s/p chemotherapy and XRT  . Personal history of radiation therapy   . Pneumonia 2019  . PONV (postoperative nausea and vomiting)   . Pure hypercholesterolemia   . Unspecified hereditary and idiopathic peripheral neuropathy     Patient Active Problem List   Diagnosis Date Noted  . Perirectal abscess 06/16/2020  . 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[place]) 04/12/2020  . Anemia 02/11/2020  . 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 Rectal bleed 06/17/2019  . Pleural effusion 12/03/2018  . Right hip pain 12/03/2018  . Leukopenia 08/21/2018  . Change in vision 06/24/2018  . Hypothyroidism 05/02/2018  . Neck pain 05/02/2018  . Nonintractable headache 04/18/2018  . Thrush 04/18/2018  . Pleural effusion associated with pulmonary infection 04/18/2018  . Other fatigue 04/18/2018  . Abdominal pain 03/06/2016  . Acute diarrhea 01/10/2016  . Neck nodule 10/13/2015  . Vitamin D deficiency 07/27/2015  . Loose stools 04/14/2015  . Mild depression ([place]) 02/02/2015  . Health care maintenance 06/17/2014  . Stress 01/28/2014  . Acute pericarditis 05/31/2013  . Environmental allergies 02/25/2013  . Left elbow pain 02/25/2013  . Abnormal liver function test 12/07/2012  . Anxiety 05/06/2012  . GERD (gastroesophageal reflux disease) 05/06/2012  . Osteoporosis 05/06/2012  . History of breast cancer  09/18/2008  . History of thyroid cancer 09/18/2008  . Hypercholesterolemia 09/18/2008  . Peripheral neuropathy 09/18/2008  . Palpitations 09/18/2008  . History of malignant neoplasm of large intestine 09/18/2008    Past Surgical History:  Procedure Laterality Date  . APPENDECTOMY  1985  . BREAST BIOPSY Right 2009   positive  . BREAST BIOPSY Right 2009   negative  . BREAST LUMPECTOMY Right 2009   breast cancer  . CHOLECYSTECTOMY  1995  . COLONOSCOPY    .  COLONOSCOPY WITH PROPOFOL N/A 10/29/2017   Procedure: COLONOSCOPY WITH PROPOFOL;  Surgeon: Manya Silvas, MD;  Location: [facility] ENDOSCOPY;  Service: Endoscopy;  Laterality: N/A;  . DILATION AND CURETTAGE OF UTERUS  1990  . DILATION AND CURETTAGE, DIAGNOSTIC / THERAPEUTIC  1990  . ESOPHAGOGASTRODUODENOSCOPY    . ESOPHAGOGASTRODUODENOSCOPY (EGD) WITH PROPOFOL N/A 10/29/2017   Procedure: ESOPHAGOGASTRODUODENOSCOPY (EGD) WITH PROPOFOL;  Surgeon: Manya Silvas, MD;  Location: [facility] ENDOSCOPY;  Service: Endoscopy;  Laterality: N/A;  . INCISION AND DRAINAGE PERIRECTAL ABSCESS N/A 06/17/2020   Procedure: IRRIGATION AND DEBRIDEMENT PERIRECTAL ABSCESS;  Surgeon: Jules Husbands, MD;  Location: [facility] ORS;  Service: General;  Laterality: N/A;  . LAPAROSCOPIC PARTIAL COLECTOMY     stage 3-C carcinoma of the cecum, s/p chemotherapy and xrt  . LITHOTRIPSY    . SIGMOIDOSCOPY  08/26/1993  . THYROID LOBECTOMY  2002   s/p XRT  . TOTAL HIP ARTHROPLASTY Right 10/24/2019   Procedure: TOTAL HIP ARTHROPLASTY;  Surgeon: Corky Mull, MD;  Location: [facility] ORS;  Service: Orthopedics;  Laterality: Right;    Prior to Admission medications   Medication Sig Start Date End Date Taking? Authorizing Provider  amoxicillin-clavulanate (AUGMENTIN) 875-125 MG tablet Take 1 tablet by mouth 2 (two) times daily for 10 days. 06/17/20 06/27/20  Jules Husbands, MD  Azelastine-Fluticasone [facility]) 137-50 MCG/ACT SUSP 2 sprays each nostril q day 02/01/20   [name], MD  clonazePAM (KLONOPIN) 0.5 MG tablet Take 1.5 mg by mouth 2 (two) times daily.  09/06/17   [provider]  diphenoxylate-atropine (LOMOTIL) 2.5-0.025 MG tablet Take 1 tablet by mouth 4 (four) times daily as needed for diarrhea or loose stools. 06/11/20   Lloyd Huger, MD  escitalopram (LEXAPRO) 20 MG tablet Take 20 mg by mouth every morning.     [provider]  levofloxacin (LEVAQUIN) 500 MG tablet Take 500 mg by mouth daily. 06/04/20   [provider]  levothyroxine (SYNTHROID) 125 MCG tablet TAKE 1 TABLET BY MOUTH EVERY DAY 05/23/20   [name], MD  loperamide (IMODIUM) 2 MG capsule Take 2 mg by mouth as needed.    [provider]  magnesium oxide (MAG-OX) 400 MG tablet TAKE 1 TABLET BY MOUTH EVERY DAY 12/05/19   [name], MD  Misc. Devices (SITZ BATH) MISC 10 Packages by Does not apply route in the morning, at noon, in the evening, and at bedtime. 06/14/20   Jacquelin Hawking, NP  ondansetron (ZOFRAN) 8 MG tablet Take 1 tablet (8 mg total) by mouth every 8 (eight) hours as needed for nausea or vomiting. 06/10/20   Jacquelin Hawking, NP  oxyCODONE-acetaminophen (PERCOCET) 7.5-325 MG tablet Take 2 tablets by mouth every 6 (six) hours as needed for severe pain. 06/17/20   Pabon, Diego F, MD  pantoprazole (PROTONIX) 40 MG tablet TAKE 1 TABLET BY MOUTH EVERY DAY 05/23/20   [name], MD  potassium chloride SA (KLOR-CON) 20 MEQ tablet Take 1 tablet (20 mEq  total) by mouth 2 (two) times daily. 06/10/20   Jacquelin Hawking, NP  valACYclovir (VALTREX) 500 MG tablet Take 500 mg by mouth daily. 05/21/20   [provider]  venetoclax (VENCLEXTA) 100 MG tablet Take 100 mg by mouth daily. Tablets should be swallowed whole with a meal and a full glass of water.    [provider]    Allergies Demeclocycline, Tetracyclines & related, Augmentin [amoxicillin-pot clavulanate], Bentyl [dicyclomine hcl], Ciprofloxacin, Codeine, Epinephrine, Flagyl [metronidazole], Librax [chlordiazepoxide-clidinium], Novocain [procaine], Phenobarbital, Prednisone, and Ultram [tramadol]  Family History  Problem Relation Age of Onset  . Stroke Mother        18s  . Alzheimer's disease Mother   . Cancer Mother   . Lung cancer Father   . Prostate cancer Father   . Cancer Father        Colon  . Colon cancer Father   . Breast cancer Sister        65's  . Lung cancer Sister   . Breast cancer Maternal Aunt     Social  History Social History   Tobacco Use  . Smoking status: Never Smoker  . Smokeless tobacco: Never Used  Vaping Use  . Vaping Use: Never used  Substance Use Topics  . Alcohol use: No    Alcohol/week: 0.0 standard drinks  . Drug use: No    Review of Systems Constitutional: No fever/chills Eyes: No visual changes. ENT: No sore throat. Cardiovascular: Denies chest pain. Respiratory: Denies shortness of breath. Gastrointestinal: Positive for rectal pain. Positive for diarrhea.    Genitourinary: Negative for dysuria. Musculoskeletal: Negative for back pain. Skin: Negative for rash. Neurological: Negative for headaches, focal weakness or numbness.  ____________________________________________   PHYSICAL EXAM:  VITAL SIGNS: ED Triage Vitals  Enc Vitals Group     BP 06/19/20 1423 133/64     Pulse Rate 06/19/20 1423 67     Resp 06/19/20 1423 18     Temp 06/19/20 1423 98.4 F (36.9 C)     Temp Source 06/19/20 1423 Oral     SpO2 06/19/20 1423 99 %     Weight 06/19/20 1424 136 lb 11 oz (62 kg)     Height 06/19/20 1424 5\' 4"  (1.626 m)     Head Circumference --      Peak Flow --      Pain Score 06/19/20 1424 0   Constitutional: Alert and oriented.  Eyes: Conjunctivae are normal.  ENT      Head: Normocephalic and atraumatic.      Nose: No congestion/rhinnorhea.      Mouth/Throat: Mucous membranes are moist.      Neck: No stridor. Hematological/Lymphatic/Immunilogical: No cervical lymphadenopathy. Cardiovascular: Normal rate, regular rhythm.  No murmurs, rubs, or gallops.  Respiratory: Normal respiratory effort without tachypnea nor retractions. Breath sounds are clear and equal bilaterally. No wheezes/rales/rhonchi. Gastrointestinal: Soft and non tender. No rebound. No guarding.  Genitourinary: Deferred Musculoskeletal: Normal range of motion in all extremities. No lower extremity edema. Neurologic:  Normal speech and language. No gross focal neurologic deficits are  appreciated.  Skin:  Skin is warm, dry and intact. No rash noted. Psychiatric: Mood and affect are normal. Speech and behavior are normal. Patient exhibits appropriate insight and judgment.  ____________________________________________    LABS (pertinent positives/negatives)  CBC wbc 3.4, hgb 12.2, plt 275 BMP na 141, k 3.0, glu 108, cr 0.90  ____________________________________________   EKG  I, Nance Pear, attending physician, personally viewed and interpreted this  EKG  EKG Time: 1421 Rate: 66 Rhythm: normal sinus rhythm Axis: normal Intervals: qtc 471 QRS: narrow, low voltage ST changes: no st elevation Impression: abnormal ekg   ____________________________________________    RADIOLOGY  None  ____________________________________________   PROCEDURES  Procedures  ____________________________________________   INITIAL IMPRESSION / ASSESSMENT AND PLAN / ED COURSE  Pertinent labs & imaging results that were available during my care of the patient were reviewed by me and considered in my medical decision making (see chart for details).   Patient presented to the emergency department today for somewhat unclear reasons. The patient is not sure why the cancer center recommended she come to the emergency department today rather than wait until tomorrow for her appointment. The patient does state she has been having diarrhea for the past few days but doesn't necessarily state she feels weaker than normal. On blood work she is hypokalemic which is consistent with diarrhea. No AKI to suggest significant dehydration. The patient also complained of a dark stool. Rectal exam was performed and stool was brown. Did have some blood, however had recent perirectal abscess procedure and there was some blood to that area. No active bleeding. Patient was given fluids and potassium here. Will give patient prescription for immodium. Patient has appointment with Dr. [name]  tomorrow.  ____________________________________________   FINAL CLINICAL IMPRESSION(S) / ED DIAGNOSES  Final diagnoses:  Diarrhea of infectious origin  Weakness  Hypokalemia     Note: This dictation was prepared with Dragon dictation. Any transcriptional errors that result from this process are unintentional     Nance Pear, MD 06/19/20 1919

## 2020-06-19 NOTE — Progress Notes (Signed)
We removed the packing. You do nit need to repack the wound. Please try to clean yourself after each bowel movement.  Please keep your follow up appointment with Dr.Finnegan tomorrow.  Please call our office if you have any questions or concerns.

## 2020-06-20 ENCOUNTER — Telehealth: Payer: Self-pay | Admitting: Oncology

## 2020-06-20 ENCOUNTER — Other Ambulatory Visit: Payer: Self-pay | Admitting: *Deleted

## 2020-06-20 ENCOUNTER — Inpatient Hospital Stay: Payer: PPO

## 2020-06-20 ENCOUNTER — Inpatient Hospital Stay: Payer: PPO | Admitting: Oncology

## 2020-06-20 ENCOUNTER — Inpatient Hospital Stay (HOSPITAL_BASED_OUTPATIENT_CLINIC_OR_DEPARTMENT_OTHER): Payer: PPO | Admitting: Hospice and Palliative Medicine

## 2020-06-20 DIAGNOSIS — R197 Diarrhea, unspecified: Secondary | ICD-10-CM | POA: Diagnosis not present

## 2020-06-20 DIAGNOSIS — E860 Dehydration: Secondary | ICD-10-CM

## 2020-06-20 DIAGNOSIS — R112 Nausea with vomiting, unspecified: Secondary | ICD-10-CM

## 2020-06-20 NOTE — Telephone Encounter (Signed)
Left pt detailed phone message with scheduled appts for 2/18. Informed pt she needed to arrive @ 9:15 for appts.

## 2020-06-20 NOTE — Progress Notes (Signed)
Virtual Visit via Telephone Note  I connected with Debra Hale on 06/20/20 at  2:20 PM EST by telephone and verified that I am speaking with the correct person using two identifiers.  Location: Patient: home Provider: clinic   I discussed the limitations, risks, security and privacy concerns of performing an evaluation and management service by telephone and the availability of in person appointments. I also discussed with the patient that there may be a patient responsible charge related to this service. The patient expressed understanding and agreed to proceed.   History of Present Illness: Ms. Debra Hale is a 70 year old female with multiple medical problems including AML on active chemotherapy with Vidaza and Venclexta.  Patient has had recent diarrhea and was seen in the ER on 06/17/2020 with anorectal pain.  CT findings were consistent with ischio rectal abscess and patient subsequently underwent I&D in the OR.  Patient has had persistent weakness with limited social support.  Palliative care was requested to help coordinate care.   Observations/Objective: Patient was an add-on to my clinic schedule today for virtual visit at Dr. Gary Fleet request.  Patient was scheduled to be seen today by Dr. [name] but no showed.  I called and spoke with patient and a friend by phone.  Patient says that she has had persistent diarrhea with multiple watery stools each day.  She denies any fever or chills.  No abdominal pain, nausea, or vomiting.  Patient has been taking intermittent Imodium but not consistently.  Patient also has Lomotil in the home but has not tried that.  I note the patient is on antibiotics following her I&D.  However, diarrhea reportedly predates initiation of antibiotics.  Patient endorses severe weakness.  She has limited help in the home.  Her only caregiver is a friend.  Home health and home palliative care has been ordered.  Assessment and Plan: AML -on active treatment  with Vidaza and Venclexta  Diarrhea -both Vidaza and Venclexta can cause diarrhea.  Patient is not the best historian and it is unclear if she is actually taking the [place].  She was noted to be hypokalemic yesterday in the ER and received KCl supplementation.  Mag level was not checked.  We will bring her in to [facility] tomorrow for labs, stool studies, C. difficile PCR, and consideration for IV fluids/electrolyte repletion.  Discussed use of antidiarrheals, bland diet, and pushing oral fluids.  Note that plan today was to consider direct admission for symptom management but patient no showed.  Case and plan discussed with Dr. [name]  Follow Up Instructions: RTC tomorrow   I discussed the assessment and treatment plan with the patient. The patient was provided an opportunity to ask questions and all were answered. The patient agreed with the plan and demonstrated an understanding of the instructions.   The patient was advised to call back or seek an in-person evaluation if the symptoms worsen or if the condition fails to improve as anticipated.  I provided 21 minutes of non-face-to-face time during this encounter.   Irean Hong, NP

## 2020-06-21 ENCOUNTER — Inpatient Hospital Stay: Payer: PPO

## 2020-06-21 ENCOUNTER — Other Ambulatory Visit: Payer: Self-pay

## 2020-06-21 ENCOUNTER — Inpatient Hospital Stay (HOSPITAL_BASED_OUTPATIENT_CLINIC_OR_DEPARTMENT_OTHER): Payer: PPO | Admitting: Nurse Practitioner

## 2020-06-21 VITALS — BP 119/75 | HR 95 | Temp 97.8°F | Resp 16 | Wt 128.2 lb

## 2020-06-21 DIAGNOSIS — T451X5A Adverse effect of antineoplastic and immunosuppressive drugs, initial encounter: Secondary | ICD-10-CM

## 2020-06-21 DIAGNOSIS — C9200 Acute myeloblastic leukemia, not having achieved remission: Secondary | ICD-10-CM

## 2020-06-21 DIAGNOSIS — E860 Dehydration: Secondary | ICD-10-CM

## 2020-06-21 DIAGNOSIS — K521 Toxic gastroenteritis and colitis: Secondary | ICD-10-CM

## 2020-06-21 DIAGNOSIS — Z5111 Encounter for antineoplastic chemotherapy: Secondary | ICD-10-CM | POA: Diagnosis not present

## 2020-06-21 DIAGNOSIS — R197 Diarrhea, unspecified: Secondary | ICD-10-CM | POA: Diagnosis not present

## 2020-06-21 DIAGNOSIS — R112 Nausea with vomiting, unspecified: Secondary | ICD-10-CM

## 2020-06-21 LAB — GASTROINTESTINAL PANEL BY PCR, STOOL (REPLACES STOOL CULTURE)

## 2020-06-21 LAB — COMPREHENSIVE METABOLIC PANEL
ALT: 20 U/L (ref 0–44)
AST: 15 U/L (ref 15–41)
Albumin: 3.7 g/dL (ref 3.5–5.0)
Alkaline Phosphatase: 94 U/L (ref 38–126)
Anion gap: 16 — ABNORMAL HIGH (ref 5–15)
BUN: 13 mg/dL (ref 8–23)
CO2: 22 mmol/L (ref 22–32)
Calcium: 9.1 mg/dL (ref 8.9–10.3)
Chloride: 103 mmol/L (ref 98–111)
Creatinine, Ser: 1.15 mg/dL — ABNORMAL HIGH (ref 0.44–1.00)
GFR, Estimated: 51 mL/min — ABNORMAL LOW (ref >60)
Glucose, Bld: 114 mg/dL — ABNORMAL HIGH (ref 70–99)
Potassium: 3.2 mmol/L — ABNORMAL LOW (ref 3.5–5.1)
Sodium: 141 mmol/L (ref 135–145)
Total Bilirubin: 0.9 mg/dL (ref 0.3–1.2)
Total Protein: 6.9 g/dL (ref 6.5–8.1)

## 2020-06-21 LAB — MAGNESIUM: Magnesium: 1.7 mg/dL (ref 1.7–2.4)

## 2020-06-21 LAB — CBC WITH DIFFERENTIAL/PLATELET
Abs Immature Granulocytes: 0.02 10*3/uL (ref 0.00–0.07)
Basophils Absolute: 0 10*3/uL (ref 0.0–0.1)
Basophils Relative: 0 %
Eosinophils Absolute: 0 10*3/uL (ref 0.0–0.5)
Eosinophils Relative: 0 %
HCT: 39.3 % (ref 36.0–46.0)
Hemoglobin: 12.9 g/dL (ref 12.0–15.0)
Immature Granulocytes: 1 %
Lymphocytes Relative: 21 %
Lymphs Abs: 0.6 10*3/uL — ABNORMAL LOW (ref 0.7–4.0)
MCH: 27.3 pg (ref 26.0–34.0)
MCHC: 32.8 g/dL (ref 30.0–36.0)
MCV: 83.1 fL (ref 80.0–100.0)
Monocytes Absolute: 0.2 10*3/uL (ref 0.1–1.0)
Monocytes Relative: 7 %
Neutro Abs: 2.1 10*3/uL (ref 1.7–7.7)
Neutrophils Relative %: 71 %
Platelets: 298 10*3/uL (ref 150–400)
RBC: 4.73 MIL/uL (ref 3.87–5.11)
RDW: 16.4 % — ABNORMAL HIGH (ref 11.5–15.5)
WBC: 2.9 10*3/uL — ABNORMAL LOW (ref 4.0–10.5)
nRBC: 0 % (ref 0.0–0.2)

## 2020-06-21 LAB — AEROBIC/ANAEROBIC CULTURE W GRAM STAIN (SURGICAL/DEEP WOUND): Gram Stain: NONE SEEN

## 2020-06-21 LAB — C DIFFICILE QUICK SCREEN W PCR REFLEX
C Diff antigen: NEGATIVE
C Diff interpretation: NOT DETECTED
C Diff toxin: NEGATIVE

## 2020-06-21 MED ORDER — MAGNESIUM SULFATE 2 GM/50ML IV SOLN
2.0000 g | Freq: Once | INTRAVENOUS | Status: AC
  Administered 2020-06-21: 2 g via INTRAVENOUS
  Filled 2020-06-21: qty 50

## 2020-06-21 MED ORDER — ONDANSETRON HCL 4 MG/2ML IJ SOLN
4.0000 mg | Freq: Once | INTRAMUSCULAR | Status: AC
  Administered 2020-06-21: 4 mg via INTRAVENOUS
  Filled 2020-06-21: qty 2

## 2020-06-21 MED ORDER — SODIUM CHLORIDE 0.9 % IV SOLN
10.0000 mg | Freq: Once | INTRAVENOUS | Status: AC
  Administered 2020-06-21: 10 mg via INTRAVENOUS
  Filled 2020-06-21: qty 10

## 2020-06-21 MED ORDER — POTASSIUM CHLORIDE IN NACL 20-0.9 MEQ/L-% IV SOLN
Freq: Once | INTRAVENOUS | Status: AC
  Filled 2020-06-21: qty 1000

## 2020-06-21 NOTE — Progress Notes (Signed)
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  Previously had I&D for rectal abscess.  She is accompanied by friend who says patient has ongoing diarrhea with all food consumption.  Not currently taking her chemotherapy pills.  Not currently taking antibiotics.  Patient and friend expressed confusion of medications.  Says she is overwhelmed.  Has not been taking antidiarrheals.  Review of systems- Review of Systems  Constitutional: Positive for malaise/fatigue and weight loss. Negative for chills and fever.  HENT: Negative for hearing loss, nosebleeds, sore throat and tinnitus.   Eyes: Negative for blurred vision and double vision.  Respiratory: Negative for cough, hemoptysis, shortness of breath and wheezing.   Cardiovascular: Negative for chest pain, palpitations and leg swelling.  Gastrointestinal: Positive for diarrhea and nausea. Negative for abdominal pain, blood in stool, constipation, melena and vomiting.  Genitourinary: Negative for dysuria and urgency.  Musculoskeletal: Negative for back pain, falls, joint pain and myalgias.  Skin: Negative for itching and  rash.  Neurological: Negative for dizziness, tingling, sensory change, loss of consciousness, weakness and headaches.  Endo/Heme/Allergies: Negative for environmental allergies. Does not bruise/bleed easily.  Psychiatric/Behavioral: Positive for memory loss (baseline per patient and friend). Negative for depression. The patient is not nervous/anxious and does not have insomnia.      Allergies  Allergen Reactions  . Demeclocycline Other (See Comments)    Throat swells  . Tetracyclines & Related Other (See Comments)    Throat swells   . Augmentin [Amoxicillin-Pot Clavulanate] Diarrhea  . Bentyl [Dicyclomine Hcl]     unkn  . Ciprofloxacin Diarrhea  . Codeine Other (See Comments)    dizziness    . Epinephrine     Speeds heart up - panic   . Flagyl [Metronidazole] Nausea And Vomiting  . Librax [Chlordiazepoxide-Clidinium]     unkn  . Novocain [Procaine] Other (See Comments)    "Shaky"  . Phenobarbital     unkn  . Prednisone     Nervous    . Ultram [Tramadol] Other (See Comments)    Sick feeling    Past Medical History:  Diagnosis Date  . Anxiety   . Arthritis    Osteoarthritis  . Breast cancer ([place]) 2009   right breast lumpectomy with rad tx  . Colon cancer ([place])    surgery with chemo and rad tx  . Complication of anesthesia   . GERD (gastroesophageal reflux disease)   . Heart palpitations   . History of hiatal hernia   . History of kidney stones   . Hypothyroidism   . Liver disease   . Liver nodule    s/p negative biopsy  . Malignant neoplasm of thyroid gland ([place]) 2002   s/p surgery  and XRT  . Osteoporosis   . Other and unspecified hyperlipidemia   . Palpitations   . Personal history of chemotherapy   . Personal history of malignant neoplasm of large intestine    carcinoma - cecum, s/p right laparoscopic colectomy - s/p chemotherapy and XRT  . Personal history of radiation therapy   . Pneumonia 2019  . PONV (postoperative nausea and vomiting)   . Pure  hypercholesterolemia   . Unspecified hereditary and idiopathic peripheral neuropathy     Past Surgical History:  Procedure Laterality Date  . APPENDECTOMY  1985  . BREAST BIOPSY Right 2009   positive  . BREAST BIOPSY Right 2009   negative  . BREAST LUMPECTOMY Right 2009   breast cancer  . CHOLECYSTECTOMY  1995  . COLONOSCOPY    . COLONOSCOPY WITH PROPOFOL N/A 10/29/2017   Procedure: COLONOSCOPY WITH PROPOFOL;  Surgeon: Manya Silvas, MD;  Location: [facility] ENDOSCOPY;  Service: Endoscopy;  Laterality: N/A;  . DILATION AND CURETTAGE OF UTERUS  1990  . DILATION AND CURETTAGE, DIAGNOSTIC / THERAPEUTIC  1990  . ESOPHAGOGASTRODUODENOSCOPY    . ESOPHAGOGASTRODUODENOSCOPY (EGD) WITH PROPOFOL N/A 10/29/2017   Procedure: ESOPHAGOGASTRODUODENOSCOPY (EGD) WITH PROPOFOL;  Surgeon: Manya Silvas, MD;  Location[facility] ENDOSCOPY;  Service: Endoscopy;  Laterality: N/A;  . INCISION AND DRAINAGE PERIRECTAL ABSCESS N/A 06/17/2020   Procedure: IRRIGATION AND DEBRIDEMENT PERIRECTAL ABSCESS;  Surgeon: Jules Husbands, MD;  Location: [facility] ORS;  Service: General;  Laterality: N/A;  . LAPAROSCOPIC PARTIAL COLECTOMY     stage 3-C carcinoma of the cecum, s/p chemotherapy and xrt  . LITHOTRIPSY    . SIGMOIDOSCOPY  08/26/1993  . THYROID LOBECTOMY  2002   s/p XRT  . TOTAL HIP ARTHROPLASTY Right 10/24/2019   Procedure: TOTAL HIP ARTHROPLASTY;  Surgeon: Corky Mull, MD;  Location: [facility] ORS;  Service: Orthopedics;  Laterality: Right;    Social History   Socioeconomic History  . Marital status: Single    Spouse name: Not on file  . Number of children: 0  . Years of education: Not on file  . Highest education level: Not on file  Occupational History  . Not on file  Tobacco Use  . Smoking status: Never Smoker  . Smokeless tobacco: Never Used  Vaping Use  . Vaping Use: Never used  Substance and Sexual Activity  . Alcohol use: No    Alcohol/week: 0.0 standard drinks  . Drug use: No  . Sexual activity:  Not Currently  Other Topics Concern  . Not on file  Social History Narrative  . Not on file   Social Determinants of Health   Financial Resource Strain: Not on file  Food Insecurity: Not on file  Transportation Needs: Not on file  Physical Activity: Not on file  Stress: Not on file  Social Connections: Not on file  Intimate Partner Violence: Not on file    Family History  Problem Relation Age of Onset  . Stroke Mother        79s  . Alzheimer's disease Mother   . Cancer Mother   . Lung cancer Father   . Prostate cancer Father   . Cancer Father        Colon  . Colon cancer Father   . Breast cancer Sister        42's  . Lung cancer Sister   . Breast cancer Maternal Aunt      Current Outpatient Medications:  .  Azelastine-Fluticasone (DYMISTA) 137-50 MCG/ACT SUSP,  2 sprays each nostril q day, Disp: 23 g, Rfl: 2 .  clonazePAM (KLONOPIN) 0.5 MG tablet, Take 1.5 mg by mouth 2 (two) times daily. , Disp: , Rfl: 0 .  diphenoxylate-atropine (LOMOTIL) 2.5-0.025 MG tablet, Take 1 tablet by mouth 4 (four) times daily as needed for diarrhea or loose stools., Disp: 30 tablet, Rfl: 0 .  escitalopram (LEXAPRO) 20 MG tablet, Take 20 mg by mouth every morning. , Disp: , Rfl:  .  levothyroxine (SYNTHROID) 125 MCG tablet, TAKE 1 TABLET BY MOUTH EVERY DAY, Disp: 90 tablet, Rfl: 1 .  loperamide (IMODIUM) 1 MG/5ML solution, Take 10 mLs (2 mg total) by mouth as needed for diarrhea or loose stools., Disp: 120 mL, Rfl: 0 .  loperamide (IMODIUM) 2 MG capsule, Take 2 mg by mouth as needed., Disp: , Rfl:  .  magnesium oxide (MAG-OX) 400 MG tablet, TAKE 1 TABLET BY MOUTH EVERY DAY, Disp: 90 tablet, Rfl: 1 .  Misc. Devices (SITZ BATH) MISC, 10 Packages by Does not apply route in the morning, at noon, in the evening, and at bedtime., Disp: 10 each, Rfl: 1 .  ondansetron (ZOFRAN) 8 MG tablet, Take 1 tablet (8 mg total) by mouth every 8 (eight) hours as needed for nausea or vomiting., Disp: 20 tablet, Rfl:  0 .  oxyCODONE-acetaminophen (PERCOCET) 7.5-325 MG tablet, Take 2 tablets by mouth every 6 (six) hours as needed for severe pain., Disp: 20 tablet, Rfl: 0 .  pantoprazole (PROTONIX) 40 MG tablet, TAKE 1 TABLET BY MOUTH EVERY DAY, Disp: 90 tablet, Rfl: 2 .  potassium chloride SA (KLOR-CON) 20 MEQ tablet, Take 1 tablet (20 mEq total) by mouth 2 (two) times daily., Disp: 21 tablet, Rfl: 0 .  valACYclovir (VALTREX) 500 MG tablet, Take 500 mg by mouth daily., Disp: , Rfl:  .  venetoclax (VENCLEXTA) 100 MG tablet, Take 100 mg by mouth daily. Tablets should be swallowed whole with a meal and a full glass of water., Disp: , Rfl:  .  amoxicillin-clavulanate (AUGMENTIN) 875-125 MG tablet, Take 1 tablet by mouth 2 (two) times daily for 10 days. (Patient not taking: Reported on 06/21/2020), Disp: 20 tablet, Rfl: 0 .  levofloxacin (LEVAQUIN) 500 MG tablet, Take 500 mg by mouth daily. (Patient not taking: Reported on 06/21/2020), Disp: , Rfl:   Current Facility-Administered Medications:  .  ondansetron (ZOFRAN) tablet 8 mg, 8 mg, Oral, Once, Guse, Jacquelynn Cree, FNP  Physical exam:  Vitals:   06/21/20 0942  BP: 119/75  Pulse: 95  Resp: 16  Temp: 97.8 F (36.6 C)  TempSrc: Tympanic  SpO2: 99%  Weight: 128 lb 3.2 oz (58.2 kg)   Physical Exam Constitutional:      Appearance: She is well-developed and well-nourished.     Comments: Thin build. Accompanied by friend.   HENT:     Head: Atraumatic.     Mouth/Throat:     Mouth: Oropharynx is clear and moist.     Pharynx: No oropharyngeal exudate.  Eyes:     General: No scleral icterus.    Conjunctiva/sclera: Conjunctivae normal.  Cardiovascular:     Rate and Rhythm: Normal rate and regular rhythm.  Pulmonary:     Effort: Pulmonary effort is normal.     Breath sounds: Normal breath sounds.  Abdominal:     General: Bowel sounds are normal. There is no distension.     Palpations: Abdomen is soft.     Tenderness: There is no abdominal tenderness. There is  no guarding  or rebound.  Musculoskeletal:        General: No edema. Normal range of motion.     Cervical back: Normal range of motion and neck supple.  Skin:    General: Skin is warm and dry.  Neurological:     Mental Status: She is alert and oriented to person, place, and time. Mental status is at baseline.     Motor: No weakness.     Gait: Gait normal.  Psychiatric:        Mood and Affect: Mood is anxious and depressed.        Speech: Speech normal.        Cognition and Memory: Cognition is impaired. Memory is impaired.        Judgment: Judgment is impulsive.      CMP Latest Ref Rng & Units 06/21/2020  Glucose 70 - 99 mg/dL 114(H)  BUN 8 - 23 mg/dL 13  Creatinine 0.44 - 1.00 mg/dL 1.15(H)  Sodium 135 - 145 mmol/L 141  Potassium 3.5 - 5.1 mmol/L 3.2(L)  Chloride 98 - 111 mmol/L 103  CO2 22 - 32 mmol/L 22  Calcium 8.9 - 10.3 mg/dL 9.1  Total Protein 6.5 - 8.1 g/dL 6.9  Total Bilirubin 0.3 - 1.2 mg/dL 0.9  Alkaline Phos 38 - 126 U/L 94  AST 15 - 41 U/L 15  ALT 0 - 44 U/L 20   CBC Latest Ref Rng & Units 06/21/2020  WBC 4.0 - 10.5 K/uL 2.9(L)  Hemoglobin 12.0 - 15.0 g/dL 12.9  Hematocrit 36.0 - 46.0 % 39.3  Platelets 150 - 400 K/uL 298   No images are attached to the encounter.  CT PELVIS W CONTRAST  Result Date: 06/16/2020 CLINICAL DATA:  Abscess, anal or rectal Patient reports pain since Friday. EXAM: CT PELVIS WITH CONTRAST TECHNIQUE: Multidetector CT imaging of the pelvis was performed using the standard protocol following the bolus administration of intravenous contrast. CONTRAST:  168mL OMNIPAQUE IOHEXOL 300 MG/ML  SOLN COMPARISON:  Abdominopelvic CT 07/21/2017 FINDINGS: Urinary Tract: Urinary bladder is unremarkable. No bladder wall thickening. Bowel: Thick-walled peripherally enhancing a perirectal collection measures 3.3 x 2.7 x 2.9 cm. This is just to the left of midline with adjacent inflammatory changes extending along the left gluteal crease. No internal or soft  tissue air. No inflammation of pelvic bowel loops. Surgical anastomosis noted in the central abdomen. Vascular/Lymphatic: No acute vascular findings. No enlarged lymph nodes. Reproductive:  Quiescent uterus and ovaries.  No adnexal mass. Other:  No pelvic free fluid.  No inguinal hernia. Musculoskeletal: Right hip arthroplasty with subsequent streak artifact. Left hip osteoarthritis. No focal bone lesion or acute osseous abnormality. IMPRESSION: Perirectal abscess measuring 3.3 x 2.7 x 2.9 cm just to the left of midline. Inflammatory changes extend inferiorly to involve the left gluteal crease. Electronically Signed   By: Keith Rake M.D.   On: 06/16/2020 22:31    Assessment and plan- Patient is a 70 y.o. female diagnosed with AML currently status post I&D for rectal abscess on Augmentin and antimicrobial prophylaxis for AML. Will check GI panel to r/o infectious etiology for diarrhea though I suspect related to medications. Medication noncompliance.  Extensive review of medications, use, rationale today- > 30 minutes. Assisted patient with medication reconciliation and making bill boxes, medication teaching - > 15 minutes. IV fluids and electrolytes in clinic today. Antiemetics given. Able to tolerate oral hydration and food today.  Will order home health to assist patient.   Return to clinic on  2/21 for re-evaluation.    Visit Diagnosis 1. Acute myeloid leukemia not having achieved remission ([place])   2. Diarrhea, unspecified type   3. Nausea vomiting and diarrhea   4. Dehydration     Patient expressed understanding and was in agreement with this plan. She also understands that She can call clinic at any time with any questions, concerns, or complaints.   Thank you for allowing me to participate in the care of this very pleasant patient.   Beckey Rutter, DNP, AGNP-C [place] [facility] [place]

## 2020-06-23 NOTE — Progress Notes (Signed)
[name]  [phone] [phone]  ID: Debra Hale OB: 1950-11-03  MR#: [id number]  CSN#:[id number]  Patient Care Team: [name], MD as PCP - General (Internal Medicine) Wellington Hampshire, MD as PCP - Cardiology (Cardiology)  CHIEF COMPLAINT: AML.  INTERVAL HISTORY: Patient returns to clinic today for repeat laboratory work and further evaluation.  She continues on antibiotics for her recent perirectal abscess, but states this is improving.  She continues to have intermittent diarrhea.  She continues to have chronic weakness and fatigue.  She is highly anxious and has a poor memory.  She has no neurologic complaints.  She denies any recent fevers or illnesses.  She has a good appetite and denies weight loss.  She has no chest pain, shortness of breath, cough, or hemoptysis.  She denies any nausea, vomiting, or constipation.  She has no urinary complaints.  Patient offers no further specific complaints today.  REVIEW OF SYSTEMS:   Review of Systems  Constitutional: Positive for malaise/fatigue. Negative for fever and weight loss.  Respiratory: Negative.  Negative for cough, hemoptysis and shortness of breath.   Cardiovascular: Negative.  Negative for chest pain and leg swelling.  Gastrointestinal: Positive for diarrhea. Negative for abdominal pain.  Genitourinary: Negative.  Negative for dysuria.  Musculoskeletal: Negative.  Negative for back pain.  Skin: Negative.  Negative for rash.  Neurological: Positive for weakness. Negative for dizziness, focal weakness and headaches.  Psychiatric/Behavioral: Positive for memory loss. The patient is nervous/anxious.     As per HPI. Otherwise, a complete review of systems is negative.  PAST MEDICAL HISTORY: Past Medical History:  Diagnosis Date  . Anxiety   . Arthritis    Osteoarthritis  . Breast cancer ([place]) 2009   right breast lumpectomy with rad tx  . Colon cancer ([place])    surgery with  chemo and rad tx  . Complication of anesthesia   . GERD (gastroesophageal reflux disease)   . Heart palpitations   . History of hiatal hernia   . History of kidney stones   . Hypothyroidism   . Liver disease   . Liver nodule    s/p negative biopsy  . Malignant neoplasm of thyroid gland ([place]) 2002   s/p surgery and XRT  . Osteoporosis   . Other and unspecified hyperlipidemia   . Palpitations   . Personal history of chemotherapy   . Personal history of malignant neoplasm of large intestine    carcinoma - cecum, s/p right laparoscopic colectomy - s/p chemotherapy and XRT  . Personal history of radiation therapy   . Pneumonia 2019  . PONV (postoperative nausea and vomiting)   . Pure hypercholesterolemia   . Unspecified hereditary and idiopathic peripheral neuropathy     PAST SURGICAL HISTORY: Past Surgical History:  Procedure Laterality Date  . APPENDECTOMY  1985  . BREAST BIOPSY Right 2009   positive  . BREAST BIOPSY Right 2009   negative  . BREAST LUMPECTOMY Right 2009   breast cancer  . CHOLECYSTECTOMY  1995  . COLONOSCOPY    . COLONOSCOPY WITH PROPOFOL N/A 10/29/2017   Procedure: COLONOSCOPY WITH PROPOFOL;  Surgeon: Manya Silvas, MD;  Location: [facility] ENDOSCOPY;  Service: Endoscopy;  Laterality: N/A;  . DILATION AND CURETTAGE OF UTERUS  1990  . DILATION AND CURETTAGE, DIAGNOSTIC / THERAPEUTIC  1990  . ESOPHAGOGASTRODUODENOSCOPY    . ESOPHAGOGASTRODUODENOSCOPY (EGD) WITH PROPOFOL N/A 10/29/2017   Procedure: ESOPHAGOGASTRODUODENOSCOPY (EGD) WITH PROPOFOL;  Surgeon: Manya Silvas, MD;  Location: [facility] ENDOSCOPY;  Service: Endoscopy;  Laterality: N/A;  . INCISION AND DRAINAGE PERIRECTAL ABSCESS N/A 06/17/2020   Procedure: IRRIGATION AND DEBRIDEMENT PERIRECTAL ABSCESS;  Surgeon: Jules Husbands, MD;  Location: [facility] ORS;  Service: General;  Laterality: N/A;  . LAPAROSCOPIC PARTIAL COLECTOMY     stage 3-C carcinoma of the cecum, s/p chemotherapy and xrt  . LITHOTRIPSY    .  SIGMOIDOSCOPY  08/26/1993  . THYROID LOBECTOMY  2002   s/p XRT  . TOTAL HIP ARTHROPLASTY Right 10/24/2019   Procedure: TOTAL HIP ARTHROPLASTY;  Surgeon: Corky Mull, MD;  Location: [facility] ORS;  Service: Orthopedics;  Laterality: Right;    FAMILY HISTORY: Family History  Problem Relation Age of Onset  . Stroke Mother        52s  . Alzheimer's disease Mother   . Cancer Mother   . Lung cancer Father   . Prostate cancer Father   . Cancer Father        Colon  . Colon cancer Father   . Breast cancer Sister        24's  . Lung cancer Sister   . Breast cancer Maternal Aunt     ADVANCED DIRECTIVES (Y/N):  N  HEALTH MAINTENANCE: Social History   Tobacco Use  . Smoking status: Never Smoker  . Smokeless tobacco: Never Used  Vaping Use  . Vaping Use: Never used  Substance Use Topics  . Alcohol use: No    Alcohol/week: 0.0 standard drinks  . Drug use: No     Colonoscopy:  PAP:  Bone density:  Lipid panel:  Allergies  Allergen Reactions  . Demeclocycline Other (See Comments)    Throat swells  . Sulfa Antibiotics     Other reaction(s): Other (See Comments) Throat swells  . Tetracyclines & Related Other (See Comments)    Throat swells   . Augmentin [Amoxicillin-Pot Clavulanate] Diarrhea  . Bentyl [Dicyclomine Hcl]     unkn  . Ciprofloxacin Diarrhea  . Codeine Other (See Comments)    dizziness    . Epinephrine     Speeds heart up - panic   . Flagyl [Metronidazole] Nausea And Vomiting  . Librax [Chlordiazepoxide-Clidinium]     unkn  . Novocain [Procaine] Other (See Comments)    "Shaky"  . Phenobarbital     unkn  . Prednisone     Nervous    . Ultram [Tramadol] Other (See Comments)    Sick feeling    Current Outpatient Medications  Medication Sig Dispense Refill  . amoxicillin-clavulanate (AUGMENTIN) 875-125 MG tablet Take 1 tablet by mouth 2 (two) times daily for 10 days. 20 tablet 0  . clonazePAM (KLONOPIN) 0.5 MG tablet Take 1.5 mg by mouth 2 (two)  times daily.   0  . diphenoxylate-atropine (LOMOTIL) 2.5-0.025 MG tablet Take 1 tablet by mouth 4 (four) times daily as needed for diarrhea or loose stools. 120 tablet 0  . escitalopram (LEXAPRO) 20 MG tablet Take 20 mg by mouth every morning.     Derrill Memo ON 07/01/2020] levofloxacin (LEVAQUIN) 500 MG tablet Take 1 tablet (500 mg total) by mouth daily. 30 tablet 0  . levothyroxine (SYNTHROID) 125 MCG tablet TAKE 1 TABLET BY MOUTH EVERY DAY 90 tablet 1  . oxyCODONE-acetaminophen (PERCOCET) 7.5-325 MG tablet Take 2 tablets by mouth every 6 (six) hours as needed for severe pain. 20 tablet 0  . pantoprazole (PROTONIX) 40 MG tablet TAKE 1 TABLET BY MOUTH EVERY DAY 90 tablet 2  .  potassium chloride SA (KLOR-CON) 20 MEQ tablet Take 1 tablet (20 mEq total) by mouth 2 (two) times daily. 21 tablet 0  . valACYclovir (VALTREX) 500 MG tablet Take 500 mg by mouth daily.    Marland Kitchen venetoclax (VENCLEXTA) 100 MG tablet Take 100 mg by mouth daily. Tablets should be swallowed whole with a meal and a full glass of water.    . Misc. Devices (SITZ BATH) MISC 10 Packages by Does not apply route in the morning, at noon, in the evening, and at bedtime. (Patient not taking: Reported on 06/26/2020) 10 each 1   Current Facility-Administered Medications  Medication Dose Route Frequency Provider Last Rate Last Admin  . ondansetron (ZOFRAN) tablet 8 mg  8 mg Oral Once Jodelle Green, FNP        OBJECTIVE: Vitals:   06/26/20 1016  BP: 101/67  Pulse: 76  Resp: 18  Temp: (!) 96.9 F (36.1 C)  SpO2: 100%     Body mass index is 22.01 kg/m.    ECOG FS:1 - Symptomatic but completely ambulatory  General: Well-developed, well-nourished, no acute distress. Eyes: Pink conjunctiva, anicteric sclera. HEENT: Normocephalic, moist mucous membranes. Lungs: No audible wheezing or coughing. Heart: Regular rate and rhythm. Abdomen: Soft, nontender, no obvious distention. Musculoskeletal: No edema, cyanosis, or clubbing. Neuro: Alert,  answering all questions appropriately. Cranial nerves grossly intact. Skin: No rashes or petechiae noted. Psych: Normal affect.   LAB RESULTS:  Lab Results  Component Value Date   NA 142 06/26/2020   K 3.2 (L) 06/26/2020   CL 108 06/26/2020   CO2 23 06/26/2020   GLUCOSE 97 06/26/2020   BUN 15 06/26/2020   CREATININE 0.94 06/26/2020   CALCIUM 9.0 06/26/2020   PROT 6.7 06/26/2020   ALBUMIN 3.7 06/26/2020   AST 11 (L) 06/26/2020   ALT 16 06/26/2020   ALKPHOS 80 06/26/2020   BILITOT 0.8 06/26/2020   GFRNONAA >60 06/26/2020   GFRAA 54 (L) 10/26/2019    Lab Results  Component Value Date   WBC 2.5 (L) 06/26/2020   NEUTROABS 1.7 06/26/2020   HGB 12.8 06/26/2020   HCT 39.9 06/26/2020   MCV 84.7 06/26/2020   PLT 211 06/26/2020     STUDIES: CT PELVIS W CONTRAST  Result Date: 06/16/2020 CLINICAL DATA:  Abscess, anal or rectal Patient reports pain since Friday. EXAM: CT PELVIS WITH CONTRAST TECHNIQUE: Multidetector CT imaging of the pelvis was performed using the standard protocol following the bolus administration of intravenous contrast. CONTRAST:  143m OMNIPAQUE IOHEXOL 300 MG/ML  SOLN COMPARISON:  Abdominopelvic CT 07/21/2017 FINDINGS: Urinary Tract: Urinary bladder is unremarkable. No bladder wall thickening. Bowel: Thick-walled peripherally enhancing a perirectal collection measures 3.3 x 2.7 x 2.9 cm. This is just to the left of midline with adjacent inflammatory changes extending along the left gluteal crease. No internal or soft tissue air. No inflammation of pelvic bowel loops. Surgical anastomosis noted in the central abdomen. Vascular/Lymphatic: No acute vascular findings. No enlarged lymph nodes. Reproductive:  Quiescent uterus and ovaries.  No adnexal mass. Other:  No pelvic free fluid.  No inguinal hernia. Musculoskeletal: Right hip arthroplasty with subsequent streak artifact. Left hip osteoarthritis. No focal bone lesion or acute osseous abnormality. IMPRESSION:  Perirectal abscess measuring 3.3 x 2.7 x 2.9 cm just to the left of midline. Inflammatory changes extend inferiorly to involve the left gluteal crease. Electronically Signed   By: MKeith RakeM.D.   On: 06/16/2020 22:31    ASSESSMENT: AML.  PLAN:  1.  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  Her peripheral blood flow cytometry also revealed 6% aberrant myeloblasts.  Patient completed cycle 1 of induction therapy at [facility] with Vidaza and venetoclax.  She only received 5 days of Vidaza for cycle 2 recently which was interrupted secondary to significant diarrhea and a perirectal abscess.  Continue 400 mg venetoclax daily.  Return to clinic on July 12, 2020 for further evaluation and consideration of cycle 3, day 1 of Vidaza.   2.  Leukopenia: Chronic and unchanged. 3.  Perirectal abscess: Continue current antibiotics.  Follow-up with surgery as scheduled. 4.  Diarrhea: Mildly improved.  Continue Lomotil as prescribed. 5.  Supportive care: Patient has been given referrals to home health and home palliative care.  Appreciate palliative care input.   6. 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  Patient also received chemotherapy and likely received Adriamycin, but exact regimen is unknown.  She completed 5 years of hormonal therapy in approximately June 2015. Currently, she has no evidence of disease.  Her most recent mammogram on March 05, 2020 was reported as BI-RADS 1.  Repeat in November 2022. 7.  History of colon cancer: Patient also states that she received chemotherapy for this, possibly FOLFOX but again this is unknown.   Patient expressed understanding and was in agreement with this plan. She also understands that She can call clinic at any time with any questions, concerns, or complaints.   Cancer Staging History of breast  cancer Staging form: Breast, AJCC 7th Edition - Clinical stage from 01/07/2016: Stage IA (T1c, N0, M0) - Signed by Lloyd Huger, MD on 01/07/2016 Laterality: Right Estrogen receptor status: Positive Progesterone receptor status: Positive HER2 status: Negative   Lloyd Huger, MD   06/27/2020 6:16 AM

## 2020-06-24 ENCOUNTER — Other Ambulatory Visit: Payer: Self-pay

## 2020-06-24 ENCOUNTER — Inpatient Hospital Stay (HOSPITAL_BASED_OUTPATIENT_CLINIC_OR_DEPARTMENT_OTHER): Payer: PPO | Admitting: Nurse Practitioner

## 2020-06-24 ENCOUNTER — Inpatient Hospital Stay: Payer: PPO

## 2020-06-24 VITALS — BP 109/70 | HR 79 | Temp 97.0°F | Resp 17

## 2020-06-24 DIAGNOSIS — C9200 Acute myeloblastic leukemia, not having achieved remission: Secondary | ICD-10-CM

## 2020-06-24 DIAGNOSIS — Z5111 Encounter for antineoplastic chemotherapy: Secondary | ICD-10-CM | POA: Diagnosis not present

## 2020-06-24 DIAGNOSIS — K521 Toxic gastroenteritis and colitis: Secondary | ICD-10-CM

## 2020-06-24 DIAGNOSIS — T451X5A Adverse effect of antineoplastic and immunosuppressive drugs, initial encounter: Secondary | ICD-10-CM

## 2020-06-24 DIAGNOSIS — K611 Rectal abscess: Secondary | ICD-10-CM

## 2020-06-24 DIAGNOSIS — R197 Diarrhea, unspecified: Secondary | ICD-10-CM

## 2020-06-24 DIAGNOSIS — Z9114 Patient's other noncompliance with medication regimen: Secondary | ICD-10-CM | POA: Diagnosis not present

## 2020-06-24 DIAGNOSIS — Z91148 Patient's other noncompliance with medication regimen for other reason: Secondary | ICD-10-CM

## 2020-06-24 LAB — CBC WITH DIFFERENTIAL/PLATELET
Abs Immature Granulocytes: 0.02 10*3/uL (ref 0.00–0.07)
Basophils Absolute: 0 10*3/uL (ref 0.0–0.1)
Basophils Relative: 0 %
Eosinophils Absolute: 0 10*3/uL (ref 0.0–0.5)
Eosinophils Relative: 0 %
HCT: 39.5 % (ref 36.0–46.0)
Hemoglobin: 13 g/dL (ref 12.0–15.0)
Immature Granulocytes: 1 %
Lymphocytes Relative: 22 %
Lymphs Abs: 0.6 10*3/uL — ABNORMAL LOW (ref 0.7–4.0)
MCH: 27.7 pg (ref 26.0–34.0)
MCHC: 32.9 g/dL (ref 30.0–36.0)
MCV: 84 fL (ref 80.0–100.0)
Monocytes Absolute: 0.2 10*3/uL (ref 0.1–1.0)
Monocytes Relative: 8 %
Neutro Abs: 1.9 10*3/uL (ref 1.7–7.7)
Neutrophils Relative %: 69 %
Platelets: 234 10*3/uL (ref 150–400)
RBC: 4.7 MIL/uL (ref 3.87–5.11)
RDW: 17.4 % — ABNORMAL HIGH (ref 11.5–15.5)
WBC: 2.7 10*3/uL — ABNORMAL LOW (ref 4.0–10.5)
nRBC: 0 % (ref 0.0–0.2)

## 2020-06-24 LAB — COMPREHENSIVE METABOLIC PANEL
ALT: 21 U/L (ref 0–44)
AST: 12 U/L — ABNORMAL LOW (ref 15–41)
Albumin: 3.7 g/dL (ref 3.5–5.0)
Alkaline Phosphatase: 88 U/L (ref 38–126)
Anion gap: 13 (ref 5–15)
BUN: 16 mg/dL (ref 8–23)
CO2: 24 mmol/L (ref 22–32)
Calcium: 9.2 mg/dL (ref 8.9–10.3)
Chloride: 107 mmol/L (ref 98–111)
Creatinine, Ser: 1.02 mg/dL — ABNORMAL HIGH (ref 0.44–1.00)
GFR, Estimated: 59 mL/min — ABNORMAL LOW (ref >60)
Glucose, Bld: 105 mg/dL — ABNORMAL HIGH (ref 70–99)
Potassium: 3.3 mmol/L — ABNORMAL LOW (ref 3.5–5.1)
Sodium: 144 mmol/L (ref 135–145)
Total Bilirubin: 0.9 mg/dL (ref 0.3–1.2)
Total Protein: 6.7 g/dL (ref 6.5–8.1)

## 2020-06-24 LAB — SAMPLE TO BLOOD BANK

## 2020-06-24 MED ORDER — LEVOFLOXACIN 500 MG PO TABS: 500.0000 mg | ORAL_TABLET | Freq: Every day | ORAL | 0 refills | Status: DC

## 2020-06-24 MED ORDER — DIPHENOXYLATE-ATROPINE 2.5-0.025 MG PO TABS: 1.0000 | ORAL_TABLET | Freq: Four times a day (QID) | ORAL | 0 refills | Status: DC | PRN

## 2020-06-24 NOTE — Progress Notes (Signed)
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 She is s/p I&D for rectal abscess. Poor compliance with medications and confusion per friend who helps her. Since visit on Friday she reports dropping pill box and being unsure of what medications to take. She continues to be confused regarding purpose of her medications. No diarrhea on Saturday and she felt well. Symptoms returned on Sunday but improved since last week. No fevers or chills. No bleeding. No urinary complaints. Weakness improved.   Review of systems- Review of Systems  Constitutional: Positive for malaise/fatigue. Negative for chills, fever and weight loss.  HENT: Negative for hearing loss, nosebleeds, sore throat and tinnitus.   Eyes: Negative for blurred vision and double vision.  Respiratory: Negative for cough, hemoptysis, shortness of breath and wheezing.   Cardiovascular: Negative for chest pain, palpitations and leg swelling.  Gastrointestinal: Positive for diarrhea. Negative for abdominal pain, blood in stool, constipation, melena, nausea and vomiting.  Genitourinary: Negative for dysuria and urgency.  Musculoskeletal: Negative for back pain,  falls, joint pain and myalgias.  Skin: Negative for itching and rash.  Neurological: Negative for dizziness, tingling, sensory change, loss of consciousness, weakness and headaches.  Endo/Heme/Allergies: Negative for environmental allergies. Does not bruise/bleed easily.  Psychiatric/Behavioral: Negative for depression. The patient is nervous/anxious. The patient does not have insomnia.       Allergies  Allergen Reactions  . Demeclocycline Other (See Comments)    Throat swells  . Tetracyclines & Related Other (See Comments)    Throat swells   . Augmentin [Amoxicillin-Pot Clavulanate] Diarrhea  . Bentyl [Dicyclomine Hcl]     unkn  . Ciprofloxacin Diarrhea  . Codeine Other (See Comments)    dizziness    . Epinephrine     Speeds heart up - panic   . Flagyl [Metronidazole] Nausea And Vomiting  . Librax [Chlordiazepoxide-Clidinium]     unkn  . Novocain [Procaine] Other (See Comments)    "Shaky"  . Phenobarbital     unkn  . Prednisone     Nervous    . Ultram [Tramadol] Other (See Comments)    Sick feeling    Past Medical History:  Diagnosis Date  . Anxiety   . Arthritis    Osteoarthritis  . Breast cancer ([place]) 2009   right breast lumpectomy with rad tx  . Colon cancer ([place])    surgery with chemo and rad tx  . Complication of anesthesia   . GERD (gastroesophageal reflux disease)   . Heart palpitations   . History of hiatal hernia   . History of kidney stones   . Hypothyroidism   . Liver disease   . Liver nodule    s/p negative biopsy  . Malignant neoplasm of thyroid gland (  [place]) 2002   s/p surgery and XRT  . Osteoporosis   . Other and unspecified hyperlipidemia   . Palpitations   . Personal history of chemotherapy   . Personal history of malignant neoplasm of large intestine    carcinoma - cecum, s/p right laparoscopic colectomy - s/p chemotherapy and XRT  . Personal history of radiation therapy   . Pneumonia 2019  . PONV (postoperative nausea and vomiting)    . Pure hypercholesterolemia   . Unspecified hereditary and idiopathic peripheral neuropathy     Past Surgical History:  Procedure Laterality Date  . APPENDECTOMY  1985  . BREAST BIOPSY Right 2009   positive  . BREAST BIOPSY Right 2009   negative  . BREAST LUMPECTOMY Right 2009   breast cancer  . CHOLECYSTECTOMY  1995  . COLONOSCOPY    . COLONOSCOPY WITH PROPOFOL N/A 10/29/2017   Procedure: COLONOSCOPY WITH PROPOFOL;  Surgeon: Manya Silvas, MD;  Location: [facility] ENDOSCOPY;  Service: Endoscopy;  Laterality: N/A;  . DILATION AND CURETTAGE OF UTERUS  1990  . DILATION AND CURETTAGE, DIAGNOSTIC / THERAPEUTIC  1990  . ESOPHAGOGASTRODUODENOSCOPY    . ESOPHAGOGASTRODUODENOSCOPY (EGD) WITH PROPOFOL N/A 10/29/2017   Procedure: ESOPHAGOGASTRODUODENOSCOPY (EGD) WITH PROPOFOL;  Surgeon: Manya Silvas, MD;  Location: [facility] ENDOSCOPY;  Service: Endoscopy;  Laterality: N/A;  . INCISION AND DRAINAGE PERIRECTAL ABSCESS N/A 06/17/2020   Procedure: IRRIGATION AND DEBRIDEMENT PERIRECTAL ABSCESS;  Surgeon: Jules Husbands, MD;  Location: [facility] ORS;  Service: General;  Laterality: N/A;  . LAPAROSCOPIC PARTIAL COLECTOMY     stage 3-C carcinoma of the cecum, s/p chemotherapy and xrt  . LITHOTRIPSY    . SIGMOIDOSCOPY  08/26/1993  . THYROID LOBECTOMY  2002   s/p XRT  . TOTAL HIP ARTHROPLASTY Right 10/24/2019   Procedure: TOTAL HIP ARTHROPLASTY;  Surgeon: Corky Mull, MD;  Location: [facility] ORS;  Service: Orthopedics;  Laterality: Right;    Social History   Socioeconomic History  . Marital status: Single    Spouse name: Not on file  . Number of children: 0  . Years of education: Not on file  . Highest education level: Not on file  Occupational History  . Not on file  Tobacco Use  . Smoking status: Never Smoker  . Smokeless tobacco: Never Used  Vaping Use  . Vaping Use: Never used  Substance and Sexual Activity  . Alcohol use: No    Alcohol/week: 0.0 standard drinks  . Drug use: No  . Sexual  activity: Not Currently  Other Topics Concern  . Not on file  Social History Narrative  . Not on file   Social Determinants of Health   Financial Resource Strain: Not on file  Food Insecurity: Not on file  Transportation Needs: Not on file  Physical Activity: Not on file  Stress: Not on file  Social Connections: Not on file  Intimate Partner Violence: Not on file    Family History  Problem Relation Age of Onset  . Stroke Mother        2s  . Alzheimer's disease Mother   . Cancer Mother   . Lung cancer Father   . Prostate cancer Father   . Cancer Father        Colon  . Colon cancer Father   . Breast cancer Sister        85's  . Lung cancer Sister   . Breast cancer Maternal Aunt      Current Outpatient Medications:  .  Azelastine-Fluticasone (DYMISTA) 137-50 MCG/ACT SUSP, 2 sprays each nostril q day, Disp: 23 g, Rfl: 2 .  clonazePAM (KLONOPIN) 0.5 MG tablet, Take 1.5 mg by mouth 2 (two) times daily. , Disp: , Rfl: 0 .  diphenoxylate-atropine (LOMOTIL) 2.5-0.025 MG tablet, Take 1 tablet by mouth 4 (four) times daily as needed for diarrhea or loose stools., Disp: 30 tablet, Rfl: 0 .  escitalopram (LEXAPRO) 20 MG tablet, Take 20 mg by mouth every morning. , Disp: , Rfl:  .  levothyroxine (SYNTHROID) 125 MCG tablet, TAKE 1 TABLET BY MOUTH EVERY DAY, Disp: 90 tablet, Rfl: 1 .  loperamide (IMODIUM) 1 MG/5ML solution, Take 10 mLs (2 mg total) by mouth as needed for diarrhea or loose stools., Disp: 120 mL, Rfl: 0 .  loperamide (IMODIUM) 2 MG capsule, Take 2 mg by mouth as needed., Disp: , Rfl:  .  Misc. Devices (SITZ BATH) MISC, 10 Packages by Does not apply route in the morning, at noon, in the evening, and at bedtime., Disp: 10 each, Rfl: 1 .  ondansetron (ZOFRAN) 8 MG tablet, Take 1 tablet (8 mg total) by mouth every 8 (eight) hours as needed for nausea or vomiting., Disp: 20 tablet, Rfl: 0 .  oxyCODONE-acetaminophen (PERCOCET) 7.5-325 MG tablet, Take 2 tablets by mouth every 6  (six) hours as needed for severe pain., Disp: 20 tablet, Rfl: 0 .  pantoprazole (PROTONIX) 40 MG tablet, TAKE 1 TABLET BY MOUTH EVERY DAY, Disp: 90 tablet, Rfl: 2 .  potassium chloride SA (KLOR-CON) 20 MEQ tablet, Take 1 tablet (20 mEq total) by mouth 2 (two) times daily., Disp: 21 tablet, Rfl: 0 .  valACYclovir (VALTREX) 500 MG tablet, Take 500 mg by mouth daily., Disp: , Rfl:  .  venetoclax (VENCLEXTA) 100 MG tablet, Take 100 mg by mouth daily. Tablets should be swallowed whole with a meal and a full glass of water., Disp: , Rfl:  .  amoxicillin-clavulanate (AUGMENTIN) 875-125 MG tablet, Take 1 tablet by mouth 2 (two) times daily for 10 days. (Patient not taking: No sig reported), Disp: 20 tablet, Rfl: 0 .  levofloxacin (LEVAQUIN) 500 MG tablet, Take 500 mg by mouth daily. (Patient not taking: No sig reported), Disp: , Rfl:   Current Facility-Administered Medications:  .  ondansetron (ZOFRAN) tablet 8 mg, 8 mg, Oral, Once, Guse, Jacquelynn Cree, FNP  Physical exam:  Vitals:   06/24/20 1016  BP: 109/70  Pulse: 79  Resp: 17  Temp: (!) 97 F (36.1 C)  TempSrc: Tympanic  SpO2: 99%   Physical Exam Constitutional:      Appearance: She is well-developed and well-nourished.     Comments: Thin build  HENT:     Head: Atraumatic.     Mouth/Throat:     Mouth: Oropharynx is clear and moist.     Pharynx: No oropharyngeal exudate.  Eyes:     General: No scleral icterus. Cardiovascular:     Rate and Rhythm: Normal rate and regular rhythm.  Pulmonary:     Effort: Pulmonary effort is normal.     Breath sounds: Normal breath sounds.  Abdominal:     General: There is no distension.     Palpations: Abdomen is soft.  Musculoskeletal:        General: No edema. Normal range of motion.     Cervical back: Normal range of motion and neck supple.     Comments: Ambulating w/o aids  Skin:    General: Skin is warm and dry.  Neurological:  Mental Status: She is alert and oriented to person, place, and  time.     Motor: No weakness.  Psychiatric:        Mood and Affect: Mood is anxious.        Behavior: Behavior is cooperative.      CMP Latest Ref Rng & Units 06/24/2020  Glucose 70 - 99 mg/dL 105(H)  BUN 8 - 23 mg/dL 16  Creatinine 0.44 - 1.00 mg/dL 1.02(H)  Sodium 135 - 145 mmol/L 144  Potassium 3.5 - 5.1 mmol/L 3.3(L)  Chloride 98 - 111 mmol/L 107  CO2 22 - 32 mmol/L 24  Calcium 8.9 - 10.3 mg/dL 9.2  Total Protein 6.5 - 8.1 g/dL 6.7  Total Bilirubin 0.3 - 1.2 mg/dL 0.9  Alkaline Phos 38 - 126 U/L 88  AST 15 - 41 U/L 12(L)  ALT 0 - 44 U/L 21   CBC Latest Ref Rng & Units 06/24/2020  WBC 4.0 - 10.5 K/uL 2.7(L)  Hemoglobin 12.0 - 15.0 g/dL 13.0  Hematocrit 36.0 - 46.0 % 39.5  Platelets 150 - 400 K/uL 234    No images are attached to the encounter.  CT PELVIS W CONTRAST  Result Date: 06/16/2020 CLINICAL DATA:  Abscess, anal or rectal Patient reports pain since Friday. EXAM: CT PELVIS WITH CONTRAST TECHNIQUE: Multidetector CT imaging of the pelvis was performed using the standard protocol following the bolus administration of intravenous contrast. CONTRAST:  125mL OMNIPAQUE IOHEXOL 300 MG/ML  SOLN COMPARISON:  Abdominopelvic CT 07/21/2017 FINDINGS: Urinary Tract: Urinary bladder is unremarkable. No bladder wall thickening. Bowel: Thick-walled peripherally enhancing a perirectal collection measures 3.3 x 2.7 x 2.9 cm. This is just to the left of midline with adjacent inflammatory changes extending along the left gluteal crease. No internal or soft tissue air. No inflammation of pelvic bowel loops. Surgical anastomosis noted in the central abdomen. Vascular/Lymphatic: No acute vascular findings. No enlarged lymph nodes. Reproductive:  Quiescent uterus and ovaries.  No adnexal mass. Other:  No pelvic free fluid.  No inguinal hernia. Musculoskeletal: Right hip arthroplasty with subsequent streak artifact. Left hip osteoarthritis. No focal bone lesion or acute osseous abnormality. IMPRESSION:  Perirectal abscess measuring 3.3 x 2.7 x 2.9 cm just to the left of midline. Inflammatory changes extend inferiorly to involve the left gluteal crease. Electronically Signed   By: Keith Rake M.D.   On: 06/16/2020 22:31    Assessment and plan- Patient is a 70 y.o. female diagnosed with AML currently on chemotherapy who returns to [facility] clinic for re-evaluation of diarrhea and medication management.   1. AML- not taking venetoclax. Reinforced use. Encouraged use of antibacterial and antiviral prophylaxis. Will refill levaquin. Currently has enough medication for this week.   2. Medication management- assisted patient with creating pill boxes today. Approximately 20 minutes of time spent educating patient on use of different medications and creating pill boxes. Patient was able to express & demonstrate understanding.   3. Diarrhea- etiology unclear. Medication side effect. Negative for c diff. Controlled with anti-diarrheals though intermittent compliance. Added lomotil to pill boxes for twice day scheduled dosing. Symptoms improved. Hold iv fluids today. Tolerating oral hydration.   4. Rectal abscess- high risk of infection with chemotherapy. Encouraged use of antibiotics and added to pill boxes.   Follow up with Dr. [name] for labs and re-evaluation on Wednesday as scheduled. May consider continuing to assist patient with weekly pill box refills if found to be beneficial.    Visit Diagnosis 1. Acute myeloid  leukemia not having achieved remission ([place])   2. Chemotherapy induced diarrhea   3. Variable compliance with medication therapy    Patient expressed understanding and was in agreement with this plan. She also understands that She can call clinic at any time with any questions, concerns, or complaints.   Thank you for allowing me to participate in the care of this very pleasant patient.   I spent a total of 35 minutes reviewing chart data, face-to-face evaluation with the patient,  counseling and coordination of care as detailed above.  Beckey Rutter, DNP, AGNP-C [place] [facility] [place]

## 2020-06-25 ENCOUNTER — Other Ambulatory Visit: Payer: Self-pay | Admitting: Internal Medicine

## 2020-06-25 DIAGNOSIS — R790 Abnormal level of blood mineral: Secondary | ICD-10-CM

## 2020-06-26 ENCOUNTER — Inpatient Hospital Stay (HOSPITAL_BASED_OUTPATIENT_CLINIC_OR_DEPARTMENT_OTHER): Payer: PPO | Admitting: Oncology

## 2020-06-26 ENCOUNTER — Inpatient Hospital Stay (HOSPITAL_BASED_OUTPATIENT_CLINIC_OR_DEPARTMENT_OTHER): Payer: PPO | Admitting: Hospice and Palliative Medicine

## 2020-06-26 ENCOUNTER — Encounter: Payer: Self-pay | Admitting: Oncology

## 2020-06-26 ENCOUNTER — Inpatient Hospital Stay: Payer: PPO

## 2020-06-26 VITALS — BP 134/84 | HR 62 | Temp 97.0°F | Resp 18

## 2020-06-26 VITALS — BP 101/67 | HR 76 | Temp 96.9°F | Resp 18 | Wt 128.2 lb

## 2020-06-26 DIAGNOSIS — Z515 Encounter for palliative care: Secondary | ICD-10-CM | POA: Diagnosis not present

## 2020-06-26 DIAGNOSIS — C9200 Acute myeloblastic leukemia, not having achieved remission: Secondary | ICD-10-CM

## 2020-06-26 DIAGNOSIS — E860 Dehydration: Secondary | ICD-10-CM

## 2020-06-26 DIAGNOSIS — E876 Hypokalemia: Secondary | ICD-10-CM

## 2020-06-26 DIAGNOSIS — Z5111 Encounter for antineoplastic chemotherapy: Secondary | ICD-10-CM | POA: Diagnosis not present

## 2020-06-26 LAB — CBC WITH DIFFERENTIAL/PLATELET
Abs Immature Granulocytes: 0.01 10*3/uL (ref 0.00–0.07)
Basophils Absolute: 0 10*3/uL (ref 0.0–0.1)
Basophils Relative: 0 %
Eosinophils Absolute: 0 10*3/uL (ref 0.0–0.5)
Eosinophils Relative: 0 %
HCT: 39.9 % (ref 36.0–46.0)
Hemoglobin: 12.8 g/dL (ref 12.0–15.0)
Immature Granulocytes: 0 %
Lymphocytes Relative: 25 %
Lymphs Abs: 0.6 10*3/uL — ABNORMAL LOW (ref 0.7–4.0)
MCH: 27.2 pg (ref 26.0–34.0)
MCHC: 32.1 g/dL (ref 30.0–36.0)
MCV: 84.7 fL (ref 80.0–100.0)
Monocytes Absolute: 0.1 10*3/uL (ref 0.1–1.0)
Monocytes Relative: 6 %
Neutro Abs: 1.7 10*3/uL (ref 1.7–7.7)
Neutrophils Relative %: 69 %
Platelets: 211 10*3/uL (ref 150–400)
RBC: 4.71 MIL/uL (ref 3.87–5.11)
RDW: 17.5 % — ABNORMAL HIGH (ref 11.5–15.5)
WBC: 2.5 10*3/uL — ABNORMAL LOW (ref 4.0–10.5)
nRBC: 0 % (ref 0.0–0.2)

## 2020-06-26 LAB — MAGNESIUM: Magnesium: 1.7 mg/dL (ref 1.7–2.4)

## 2020-06-26 LAB — COMPREHENSIVE METABOLIC PANEL
ALT: 16 U/L (ref 0–44)
AST: 11 U/L — ABNORMAL LOW (ref 15–41)
Albumin: 3.7 g/dL (ref 3.5–5.0)
Alkaline Phosphatase: 80 U/L (ref 38–126)
Anion gap: 11 (ref 5–15)
BUN: 15 mg/dL (ref 8–23)
CO2: 23 mmol/L (ref 22–32)
Calcium: 9 mg/dL (ref 8.9–10.3)
Chloride: 108 mmol/L (ref 98–111)
Creatinine, Ser: 0.94 mg/dL (ref 0.44–1.00)
GFR, Estimated: >60 mL/min (ref >60)
Glucose, Bld: 97 mg/dL (ref 70–99)
Potassium: 3.2 mmol/L — ABNORMAL LOW (ref 3.5–5.1)
Sodium: 142 mmol/L (ref 135–145)
Total Bilirubin: 0.8 mg/dL (ref 0.3–1.2)
Total Protein: 6.7 g/dL (ref 6.5–8.1)

## 2020-06-26 MED ORDER — POTASSIUM CHLORIDE IN NACL 20-0.9 MEQ/L-% IV SOLN
Freq: Once | INTRAVENOUS | Status: AC
  Filled 2020-06-26: qty 1000

## 2020-06-26 MED ORDER — POTASSIUM CHLORIDE 20 MEQ/100ML IV SOLN: 20.0000 meq | Freq: Once | INTRAVENOUS | Status: DC

## 2020-06-26 MED ORDER — SODIUM CHLORIDE 0.9 % IV SOLN
Freq: Once | INTRAVENOUS | Status: DC
  Filled 2020-06-26: qty 250

## 2020-06-26 NOTE — Progress Notes (Signed)
Receiving IV hydration with potassium over 2 hours. Pt eating snacks and drinking soda. No complaints at time of discharge. VSS.

## 2020-06-26 NOTE — Progress Notes (Signed)
[place]  Telephone:([phone] [phone]   Name: Debra Hale Date: 06/26/2020 MRN: [id number]  DOB: 1951-04-20  Patient Care Team: [name], MD as PCP - General (Internal Medicine) Wellington Hampshire, MD as PCP - Cardiology (Cardiology)    REASON FOR CONSULTATION: Debra Hale is a 70 y.o. 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  Patient has had recent diarrhea and was seen in the ER on 06/17/2020 with anorectal pain.  CT findings were consistent with ischio rectal abscess and patient subsequently underwent I&D in the OR.  Patient has had persistent weakness with limited social support.  Palliative care was requested to help coordinate care.   SOCIAL HISTORY:     reports that she has never smoked. She has never used smokeless tobacco. She reports that she does not drink alcohol and does not use drugs.  Patient is unmarried.  She has no children.  She has some siblings but not nearby.  She has a couple of friends who are involved.  Patient previously worked in an office setting.  ADVANCE DIRECTIVES:  None on file  CODE STATUS:   PAST MEDICAL HISTORY: Past Medical History:  Diagnosis Date  . Anxiety   . Arthritis    Osteoarthritis  . Breast cancer ([place]) 2009   right breast lumpectomy with rad tx  . Colon cancer ([place])    surgery with chemo and rad tx  . Complication of anesthesia   . GERD (gastroesophageal reflux disease)   . Heart palpitations   . History of hiatal hernia   . History of kidney stones   . Hypothyroidism   . Liver disease   . Liver nodule    s/p negative biopsy  . Malignant neoplasm of [place] gland ([place]) 2002   s/p surgery and XRT  . Osteoporosis   . Other and unspecified hyperlipidemia   . Palpitations   . Personal history of chemotherapy   . Personal history of malignant neoplasm of large intestine    carcinoma -  cecum, s/p right laparoscopic colectomy - s/p chemotherapy and XRT  . Personal history of radiation therapy   . Pneumonia 2019  . PONV (postoperative nausea and vomiting)   . Pure hypercholesterolemia   . Unspecified hereditary and idiopathic peripheral neuropathy     PAST SURGICAL HISTORY:  Past Surgical History:  Procedure Laterality Date  . APPENDECTOMY  1985  . BREAST BIOPSY Right 2009   positive  . BREAST BIOPSY Right 2009   negative  . BREAST LUMPECTOMY Right 2009   breast cancer  . CHOLECYSTECTOMY  1995  . COLONOSCOPY    . COLONOSCOPY WITH PROPOFOL N/A 10/29/2017   Procedure: COLONOSCOPY WITH PROPOFOL;  Surgeon: Manya Silvas, MD;  Location: [facility] ENDOSCOPY;  Service: Endoscopy;  Laterality: N/A;  . DILATION AND CURETTAGE OF UTERUS  1990  . DILATION AND CURETTAGE, DIAGNOSTIC / THERAPEUTIC  1990  . ESOPHAGOGASTRODUODENOSCOPY    . ESOPHAGOGASTRODUODENOSCOPY (EGD) WITH PROPOFOL N/A 10/29/2017   Procedure: ESOPHAGOGASTRODUODENOSCOPY (EGD) WITH PROPOFOL;  Surgeon: Manya Silvas, MD;  Location: [facility] ENDOSCOPY;  Service: Endoscopy;  Laterality: N/A;  . INCISION AND DRAINAGE PERIRECTAL ABSCESS N/A 06/17/2020   Procedure: IRRIGATION AND DEBRIDEMENT PERIRECTAL ABSCESS;  Surgeon: Jules Husbands, MD;  Location: [facility] ORS;  Service: General;  Laterality: N/A;  . LAPAROSCOPIC PARTIAL COLECTOMY     stage 3-C carcinoma of the cecum, s/p chemotherapy and xrt  .  LITHOTRIPSY    . SIGMOIDOSCOPY  08/26/1993  . THYROID LOBECTOMY  2002   s/p XRT  . TOTAL HIP ARTHROPLASTY Right 10/24/2019   Procedure: TOTAL HIP ARTHROPLASTY;  Surgeon: Corky Mull, MD;  Location: [facility] ORS;  Service: Orthopedics;  Laterality: Right;    HEMATOLOGY/ONCOLOGY HISTORY:  Oncology History  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[place])  04/12/2020 Initial Diagnosis   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[place])   06/10/2020 -  Chemotherapy    Patient is on Treatment Plan: AML AZACITIDINE SQ D1-7 Q28D        ALLERGIES:   is allergic to demeclocycline, sulfa antibiotics, tetracyclines & related, augmentin [amoxicillin-pot clavulanate], bentyl [dicyclomine hcl], ciprofloxacin, codeine, epinephrine, flagyl [metronidazole], librax [chlordiazepoxide-clidinium], novocain [procaine], phenobarbital, prednisone, and ultram [tramadol].  MEDICATIONS:  Current Outpatient Medications  Medication Sig Dispense Refill  . amoxicillin-clavulanate (AUGMENTIN) 875-125 MG tablet Take 1 tablet by mouth 2 (two) times daily for 10 days. 20 tablet 0  . clonazePAM (KLONOPIN) 0.5 MG tablet Take 1.5 mg by mouth 2 (two) times daily.   0  . diphenoxylate-atropine (LOMOTIL) 2.5-0.025 MG tablet Take 1 tablet by mouth 4 (four) times daily as needed for diarrhea or loose stools. 120 tablet 0  . escitalopram (LEXAPRO) 20 MG tablet Take 20 mg by mouth every morning.     Derrill Memo ON 07/01/2020] levofloxacin (LEVAQUIN) 500 MG tablet Take 1 tablet (500 mg total) by mouth daily. 30 tablet 0  . levothyroxine (SYNTHROID) 125 MCG tablet TAKE 1 TABLET BY MOUTH EVERY DAY 90 tablet 1  . Misc. Devices (SITZ BATH) MISC 10 Packages by Does not apply route in the morning, at noon, in the evening, and at bedtime. (Patient not taking: Reported on 06/26/2020) 10 each 1  . oxyCODONE-acetaminophen (PERCOCET) 7.5-325 MG tablet Take 2 tablets by mouth every 6 (six) hours as needed for severe pain. 20 tablet 0  . pantoprazole (PROTONIX) 40 MG tablet TAKE 1 TABLET BY MOUTH EVERY DAY 90 tablet 2  . potassium chloride SA (KLOR-CON) 20 MEQ tablet Take 1 tablet (20 mEq total) by mouth 2 (two) times daily. 21 tablet 0  . valACYclovir (VALTREX) 500 MG tablet Take 500 mg by mouth daily.    Marland Kitchen venetoclax (VENCLEXTA) 100 MG tablet Take 100 mg by mouth daily. Tablets should be swallowed whole with a meal and a full glass of water.     Current Facility-Administered Medications  Medication Dose Route Frequency Provider Last Rate Last Admin  . ondansetron (ZOFRAN) tablet 8 mg  8 mg Oral  Once Guse, Jacquelynn Cree, FNP        VITAL SIGNS: There were no vitals taken for this visit. There were no vitals filed for this visit.  Estimated body mass index is 22.01 kg/m as calculated from the following:   Height as of 06/19/20: 5' 4"  (1.626 m).   Weight as of an earlier encounter on 06/26/20: 128 lb 3.2 oz (58.2 kg).  LABS: CBC:    Component Value Date/Time   WBC 2.5 (L) 06/26/2020 0923   HGB 12.8 06/26/2020 0923   HGB 12.5 05/09/2018 1147   HCT 39.9 06/26/2020 0923   HCT 36.8 05/09/2018 1147   PLT 211 06/26/2020 0923   PLT 285 05/09/2018 1147   MCV 84.7 06/26/2020 0923   MCV 86 05/09/2018 1147   MCV 89 11/30/2013 1419   NEUTROABS 1.7 06/26/2020 0923   NEUTROABS 2.9 11/20/2013 1404   LYMPHSABS 0.6 (L) 06/26/2020 0923   LYMPHSABS 0.9 (L) 11/20/2013 1404  MONOABS 0.1 06/26/2020 0923   MONOABS 0.2 11/20/2013 1404   EOSABS 0.0 06/26/2020 0923   EOSABS 0.1 11/20/2013 1404   BASOSABS 0.0 06/26/2020 0923   BASOSABS 0.0 11/20/2013 1404   Comprehensive Metabolic Panel:    Component Value Date/Time   NA 142 06/26/2020 0923   NA 143 05/09/2018 1147   NA 145 11/30/2013 1419   K 3.2 (L) 06/26/2020 0923   K 3.5 11/30/2013 1419   CL 108 06/26/2020 0923   CL 109 (H) 11/30/2013 1419   CO2 23 06/26/2020 0923   CO2 29 11/30/2013 1419   BUN 15 06/26/2020 0923   BUN 11 05/09/2018 1147   BUN 15 11/30/2013 1419   CREATININE 0.94 06/26/2020 0923   CREATININE 1.04 11/30/2013 1419   GLUCOSE 97 06/26/2020 0923   GLUCOSE 97 11/30/2013 1419   CALCIUM 9.0 06/26/2020 0923   CALCIUM 8.7 11/30/2013 1419   AST 11 (L) 06/26/2020 0923   AST 28 11/30/2013 1419   ALT 16 06/26/2020 0923   ALT 71 (H) 11/30/2013 1419   ALKPHOS 80 06/26/2020 0923   ALKPHOS 114 11/30/2013 1419   BILITOT 0.8 06/26/2020 0923   BILITOT 0.7 11/30/2013 1419   PROT 6.7 06/26/2020 0923   PROT 7.1 11/30/2013 1419   ALBUMIN 3.7 06/26/2020 0923   ALBUMIN 3.3 (L) 11/30/2013 1419    RADIOGRAPHIC STUDIES: CT PELVIS  W CONTRAST  Result Date: 06/16/2020 CLINICAL DATA:  Abscess, anal or rectal Patient reports pain since Friday. EXAM: CT PELVIS WITH CONTRAST TECHNIQUE: Multidetector CT imaging of the pelvis was performed using the standard protocol following the bolus administration of intravenous contrast. CONTRAST:  194m OMNIPAQUE IOHEXOL 300 MG/ML  SOLN COMPARISON:  Abdominopelvic CT 07/21/2017 FINDINGS: Urinary Tract: Urinary bladder is unremarkable. No bladder wall thickening. Bowel: Thick-walled peripherally enhancing a perirectal collection measures 3.3 x 2.7 x 2.9 cm. This is just to the left of midline with adjacent inflammatory changes extending along the left gluteal crease. No internal or soft tissue air. No inflammation of pelvic bowel loops. Surgical anastomosis noted in the central abdomen. Vascular/Lymphatic: No acute vascular findings. No enlarged lymph nodes. Reproductive:  Quiescent uterus and ovaries.  No adnexal mass. Other:  No pelvic free fluid.  No inguinal hernia. Musculoskeletal: Right hip arthroplasty with subsequent streak artifact. Left hip osteoarthritis. No focal bone lesion or acute osseous abnormality. IMPRESSION: Perirectal abscess measuring 3.3 x 2.7 x 2.9 cm just to the left of midline. Inflammatory changes extend inferiorly to involve the left gluteal crease. Electronically Signed   By: MKeith RakeM.D.   On: 06/16/2020 22:31    PERFORMANCE STATUS (ECOG) : 1 - Symptomatic but completely ambulatory  Review of Systems Unless otherwise noted, a complete review of systems is negative.  Physical Exam General: NAD Pulmonary: Unlabored Extremities: no edema, no joint deformities Skin: no rashes Neurological: Weakness but otherwise nonfocal  IMPRESSION: I met with patient while she was receiving an infusion.  I introduced palliative care services and attempted to establish therapeutic rapport.  Patient reports that she is symptomatically improved.  She saw LBeckey Rutter NP on  2/18 and 2/21.  Pillbox was filled due to medication noncompliance.  It is still unclear what medications patient is taking.  However, patient denies any distressing symptoms today.  Patient had many questions about her cancer treatment and prognosis.  She seems to have limited comprehension regarding these issues.  At one point, she suggested that she thought she was finished with all cancer treatment.  We spoke at length about the need for continued treatment and follow-up in the cancer center to support her through this illness.  Patient has limited social support.  She has a couple of friends who are helping as able.  Will refer to home health and home palliative care.  PLAN: -Continue current scope of treatment -Referral to home health -Referral to home palliative care -ACP/MOST form reviewed -RTC next week  Case and plan discussed with Dr. [name]   Patient expressed understanding and was in agreement with this plan. She also understands that She can call the clinic at any time with any questions, concerns, or complaints.     Time Total: 45 minutes  Visit consisted of counseling and education dealing with the complex and emotionally intense issues of symptom management and palliative care in the setting of serious and potentially life-threatening illness.Greater than 50%  of this time was spent counseling and coordinating care related to the above assessment and plan.  Signed by: Altha Harm, PhD, NP-C

## 2020-06-27 ENCOUNTER — Telehealth: Payer: Self-pay | Admitting: Nurse Practitioner

## 2020-06-27 NOTE — Telephone Encounter (Signed)
Rec'd message that Butch Penny had called me back and wanted to speak with me about her converation with patient.  Called Butch Penny back and she said that she discussed Palliative services with her and patient was still reluctant about calling me back today.  Butch Penny will be going over to patient's home tomorrow to take her to MD appointment and will see if she will call me back tomorrow morning while she is there with her.

## 2020-06-27 NOTE — Telephone Encounter (Signed)
Rec'd call back form Teressa Senter, patient'd friend, and I explained why I was calling and she said that patient was probably not going to happy with this.  I explained to Butch Penny what Palliative services were and she said that patient really needed this.  She said she would get in touch with patient and explain to her what Palliative services were and ask patient to give [place] a call back.

## 2020-06-27 NOTE — Telephone Encounter (Signed)
Called patient to schedule a Paliiative Consult, no answer - I was unable to leave a message due to mailbox was full.    I then called patient's friend, Teressa Senter, to let her know that i was trying to reach patient and had to leave a message, left my name and call back number requesting a return call.

## 2020-06-28 ENCOUNTER — Inpatient Hospital Stay: Payer: PPO | Admitting: Nurse Practitioner

## 2020-07-03 NOTE — Unmapped (Unsigned)
This patient visit was completed through the use of an audio/video or telephone encounter.    I spent 23 minutes on the real-time audio and video with the patient. I spent an additional 15 minutes on pre- and post-visit activities.     The patient was physically located in [place] or a state in which I am permitted to provide care. The patient understood that s/he may incur co-pays and cost sharing, and agreed to the telemedicine visit. The visit was completed via phone and/or video, which was appropriate and reasonable under the circumstances given the patient's presentation at the time.    The patient has been advised of the potential risks and limitations of this mode of treatment (including, but not limited to, the absence of in-person examination) and has agreed to be treated using telemedicine. The patient's/patient's family's questions regarding telemedicine have been answered.     If the phone/video visit was completed in an ambulatory setting, the patient has also been advised to contact their provider???s office for worsening conditions, and seek emergency medical treatment and/or call 911 if the patient deems either necessary.    Visit conducted by: Video  Person contacted: [name] phone number: [phone]  Is there someone else in the room? no       [facility] Follow-up Visit Note     Patient Name: Stephanie Jimenez  Patient Age: 70 y.o.  Encounter Date: 07/04/2020    Primary Care Provider:  Charm Barges, MD    Referring Physician:  [name], MD  [address]  [place],  [place] [address]    Reason for visit:  AML follow up    Current therapy:   AZA/Ven    Assessment/Plan:  Stephanie Jimenez is a 70 y.o. female with past medical history of anxiety, breast cancer, colon cancer, hypothyroidism, and GERD who presents to establish care for AML. Diagnosed on bone marrow biopsy on 04/08/2020 while undergoing evaluation for persistent leukopenia. She has a remote history (~10-15 years ago) of systemic chemotherapy for breast and colon cancer which raises suspicion of therapy-related AML. However, cytogenetics were not specific or supportive of this diagnosis.     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 2010, Blood, PMID: 91478295) . For older patients (age > 22) unable to undergo intensive induction chemotherapy, the use of venetoclax in combination with an HMA (decitabine or azacitidine) resulted in an CR+CRi of 73%, with a median duration of 11.3 months, and a median overall survival of 17.5 months (DiNardo et al. 2019, Blood, PMID: 62130865). Both, inpatient (high intensity induction chemotherapy) and outpatient (AZA/Ven) treatment options were presented and discussed with the patient. Through shared decision making we decided to go forward with AZA/Ven.    Additionally, after our initial visit with the patient, she was found to have an NPM1 mutation, along with an IDH1 mutation and CEBPA mutation.  These mutations together confer favorable response to AZA/VEN (ignoring the potential for treatment related AML).  Older (>65 years) patients with NPM1 mutated AML had a 88% CR rate with AZA/VEN, and a mOS not reached (Lachowiez, Blood Advances 2020).  IDH1 mutations  are also known to confer increased response rates to venetoclax.      She tolerated the first cycle of AZA/Ven very well and post C1 BMBx was consistent with disease remission.   C2 of AZA/VEN complicated by severe diarrhea, leading to shortening of AZA to 5 (from 7) days.     For subsequent cycles:   Hold Parameters: Hold treatment for platelets < 25 and ANC < 0.5.  Patients with prolonged cytopenias (i.e. > two weeks) should have a bone marrow biopsy.   Dose reductions: If warranted due to prolonged neutrophil or platelet recovery  ??? First: Reduce VEN to 21 days (from 28 days)  ??? Second: Reduce AZA to 5 days (from 7 days)   ??? Third: Reduce VEN to 14 days (from 21 days)     Supportive Care   ??? Levaquin for ANC < 500  ??? Posaconazole for patients at high risk for invasive fungal infections with prolonged neutropenia or history of invasive fungal infection, exposure to broad-spectrum antibiotics, loss of barrier integrity, high-risk exposure.  If this drug is to be used, venetoclax should be reduced to 100 mg a day.     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  By ELN criteria (again ignoring the history of chemotherapy), this would be favorable risk disease with a high likelihood of responding to chemotherapy. She had a great response to AZA/Ven and achieved remission after C1.   - Continue AZA/VEN - Anticipate C3 after count recovery    - Off posa  - continue Levo 500 mg daily for neutropenia when ANC <0.5  - follow up with Dr. Milinda Cave office   - can re-start aza and venetoclax if platelets >25k and ANC > 0.5  - video visit follow up in 1 month    Treatment timeline:  05/03/2020: C1D1 Aza/Ven  05/27/20: BMBx post C1 demonstrates MLFS   06/10/20: C2D1 Aza/VEN  - Only received 5d of AZA due to diarrhea, perirectal abscess     Diarrhea: Unclear etiology. May be related to AZA.   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 Unclear significance in the absence of lymphadenopathy, splenomegaly, or other signs of a lymphoid neoplasm.  - CTM    Hypothyroidism: Secondary to treatment of thyroid cancer.  - Consider repeat TSH in 4-weeks which was elevated on most recent testing  - Continue levothyroxine    Psychosocial distress: She reports a high level of anxiety regarding the management of the above.   - Counseling given   - Consider ref to comprehensive cancer support program     Patient-centered care/Shared decision-making:   We discussed the plan above at length. The patient and her friend actively contributed to the conversation. Specifically, her most important outcomes are:   1) Prolonging OS  2) Avoiding Long-term side effects   3) Maintaining her overall functional activity     Supportive Care Needs: We recommend based on the patient???s underlying diagnosis and treatment history the following supportive care:    1. Antimicrobial prophylaxis:  None    2. Blood product support:  Leukoreduced blood products are required.  Irradiated blood products are preferred, but in case of urgent transfusion needs non-irradiated blood products may be used: 2 units for Hg <= 7.0.     Coordination of care:   - next cycle to start when counts have recovered   -  Follow up in 1 month with video visit    Mariel Aloe, MD  Leukemia Program  Division of Hematology  [facility]      Nurse Navigator (non-clinical trial patients):       Tel. [phone]       Fax. [phone]  Toll-free appointments: [phone]  Scheduling assistance: [phone]  After hours/weekends: [phone] (ask for adult hematology/oncology on-call)        History of Present Illness:   Stephanie Jimenez is a 70 y.o. female with past medical history noted as above who presents to establish care for AML.    She presented for evaluation of incidentally noted leukopenia in November 2021. Labs obtained around that time showed a normal iron panel, folate, vitamin B12, an elevated TSH, and flow cytometry with 6% myeloblast. She underwent a bone marrow biopsy on December 6th which showed a normocellular bone marrow (30%) involved by therapy-related acute myeloid leukemia (26% blasts by manual aspirate differential) and with a monotypic B-cell population. Review of the EMR shows that her leukopenia dates back to at least June 2020. She reports having a viral illness at the end of 2019 which required a prolonged recovery time. Since then her main health problem has been bilateral hip pain. 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 She has been doing well with physical therapy but apparently will require L hip replacement eventually.    She has a history of colon cancer that was diagnosed in her 51s. She reports having a partial colectomy and undergoing chemotherapy and radiation. She later developed breast cancer in her late 33s which was also treated with resection and chemotherapy. She does not recall the specific chemotherapy regimens or dates treatment. She also has a history of thyroid cancer and s/p resection in 2001.   ??  Her social history is notable for being retired and no history of tobacco, alcohol, or drug use. She lives in [place], [place].     Family history is remarkable for cancer in her father (lung and prostate) and sister (breast), and Alzheimer's in her mother    Interval history:  Fatigued and sleepy. Notes diarrhea 2-3 x per day. Sometimes up to 4 x per day. No fevers, chills, or infectious symptoms.     Oncology History is as below:   Oncology History Overview Note   Referring/Local Oncologist: Dr. Orlie Dakin, [facility]    Diagnosis:   Final Diagnosis   Date Value Ref Range Status   04/08/2020   Final    (Outside case: [phone], dated 04/08/2020)  Bone marrow, aspiration and biopsy  -  Normocellular bone marrow (30%) involved by therapy-related acute myeloid leukemia (26% blasts by manual aspirate differential) (See Comment)  -   Monotypic B-cell population with partial CD5 expression identified by outside flow cytometric analysis (See Comment)    This electronic signature is attestation that the pathologist personally reviewed the submitted material(s) and the final diagnosis reflects that evaluation.            Genetics:   Karyotype/FISH: 46,XX,t(1;12)(p36.3;q13)[9]/46,XX[11]     Molecular Genetics: NPM1, IDH1, CEBPA (single allele)          Acute myeloid leukemia in remission (CMS-HCC)   04/12/2020 Initial Diagnosis    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CMS-HCC)     05/03/2020 -  Chemotherapy    IP/OP AML AZACITIDINE + VENETOCLAX  azacitidine 75 mg/m2 SQ on days 1-7, venetoclax ramp up week 1; dose dependent, then azacitidine 75 mg/m2 SQ on days  1-7 every 28 days.     05/27/2020 Biopsy    Final Diagnosis   Bone marrow, right iliac, aspiration and biopsy  -  Hypocellular bone marrow (5-10%) with reduced erythroid-predominant trilineage hematopoiesis, mild cytologic dyspoiesis and <1% blasts by manual aspirate differential            Review of Systems:   ROS reviewed and negative except as noted in H and P     Allergies:  Allergies   Allergen Reactions   ??? Demeclocycline Other (See Comments)     Throat swells  Throat swells     ??? Sulfa (Sulfonamide Antibiotics) Other (See Comments)     Throat swells   ??? Tetracycline Swelling     Throat swelling   ??? Amoxicillin-Pot Clavulanate Diarrhea   ??? Ciprofloxacin Diarrhea   ??? Codeine Dizziness and Other (See Comments)     dizziness     ??? Procaine Other (See Comments)     Shaky   ??? Epinephrine Palpitations     Speeds heart up - panic      ??? Metronidazole Nausea And Vomiting   ??? Prednisone Anxiety     Nervous   Nervous      ??? Tramadol Nausea Only and Other (See Comments)     Sick feeling  Sick feeling         Medications:     Current Outpatient Medications:   ???  azelastine-fluticasone (DYMISTA) 137-50 mcg/spray nasal spray, 2 sprays each nostril q day, Disp: , Rfl:   ???  clonazePAM (KLONOPIN) 0.5 MG tablet, Take 0.75 mg by mouth two (2) times a day. , Disp: , Rfl:   ???  escitalopram oxalate (LEXAPRO) 20 MG tablet, Take 20 mg by mouth every morning., Disp: , Rfl:   ???  levoFLOXacin (LEVAQUIN) 500 MG tablet, Take 1 tablet (500 mg total) by mouth daily., Disp: 30 tablet, Rfl: 2  ???  levothyroxine (SYNTHROID) 125 MCG tablet, Take 1 tablet by mouth daily., Disp: , Rfl:   ???  loperamide (IMODIUM) 2 mg capsule, Take 2 mg by mouth four (4) times a day as needed., Disp: , Rfl:   ???  magnesium oxide (MAG-OX) 400 mg (241.3 mg elemental magnesium) tablet, Take 1 tablet by mouth daily., Disp: , Rfl:   ???  ondansetron (ZOFRAN) 8 MG tablet, Take 1 tablet (8 mg total) by mouth every eight (8) hours as needed for nausea., Disp: 30 tablet, Rfl: 2  ???  pantoprazole (PROTONIX) 40 MG tablet, Take 1 tablet by mouth daily., Disp: , Rfl:   ???  posaconazole (NOXAFIL) 100 mg TbEC delayed released tablet, Take 300 mg by mouth daily. (Patient not taking: Reported on 06/04/2020), Disp: 90 tablet, Rfl: 5  ???  potassium chloride (KLOR-CON) 10 MEQ CR tablet, Take 1 tablet by mouth daily., Disp: , Rfl:   ???  valACYclovir (VALTREX) 500 MG tablet, TAKE 1 TABLET (500 MG TOTAL) BY MOUTH DAILY., Disp: 90 tablet, Rfl: 0  ???  venetoclax (VENCLEXTA) 100 mg tablet, Take 1 tablet (100 mg total) by mouth daily. Take with a meal and water. Do not chew, crush, or break tablets. (Patient not taking: Reported on 06/04/2020), Disp: 30 tablet, Rfl: 0    Medical History:  As per HPI    Social History:  As per HPI    Family History:  As per HPI    Objective:   There were no vitals taken for this visit. - VIDEO VISIT    GENERAL: Well-appearing white  woman. NAD. Alone.   HEENT: Pupils equal, round. EOMI.   HEART: Normal color, not excessive pallor. Not ashen.   CHEST/LUNG: Normal work of breathing. No dyspnea with conversation.   EXTREMITIES: No edema, cyanosis or clubbing in viewed UE.   SKIN: No noticable rash or petechiae.   NEURO EXAM: Grossly intact.       Test Results:  No results found for this or any previous visit (from the past 48 hour(s)). HPI    Objective:   There were no vitals taken for this visit. - VIDEO VISIT    GENERAL: Well-appearing ***. NAD. A/b ***.   HEENT: Pupils equal, round. EOMI.   HEART: Normal color, not excessive pallor. Not ashen.   CHEST/LUNG: Normal work of breathing. No dyspnea with conversation.   EXTREMITIES: No edema, cyanosis or clubbing in viewed UE.   SKIN: No noticable rash or petechiae.   NEURO EXAM: Grossly intact.       Test Results:  No results found for this or any previous visit (from the past 48 hour(s)).

## 2020-07-04 ENCOUNTER — Telehealth
Admit: 2020-07-04 | Discharge: 2020-07-05 | Payer: PRIVATE HEALTH INSURANCE | Attending: Hematology | Primary: Hematology

## 2020-07-04 DIAGNOSIS — C9201 Acute myeloblastic leukemia, in remission: Secondary | ICD-10-CM | POA: Diagnosis not present

## 2020-07-04 NOTE — Unmapped (Signed)
Completed med. Review. Gave patient instructions for video visit.

## 2020-07-05 NOTE — Unmapped (Signed)
Nice to see you today.     We discussed the following:      1. AML - Congratulations, you are in remission! Keep up the good work. Dr. Orlie Dakin will start chemotherapy again once your counts have recovered.     2. Diarrhea - I don't really know why this is happening, though it may be related to one of your chemotherapy drugs. It is fine to take the imodium every time you have a loose bowel movement and to take the lomotil as prescribed by Dr. Orlie Dakin. I would suggest that Dr. Orlie Dakin make sure that you don't have an infection by checking your stool.      I will have my navigator make another video visit in a month or so.     Mariel Aloe, MD  Leukemia Program     Nurse Navigator (non-clinical trial patients):       Tel. [phone]       Fax. [phone]  Toll-free appointments: [phone]  Scheduling assistance: [phone]  After hours/weekends: [phone] (ask for adult hematology/oncology on-call)      Lab Results   Component Value Date    WBC 0.3 (LL) 05/30/2020    HGB 9.8 (L) 05/30/2020    HCT 30.8 (L) 05/30/2020    PLT 307 05/30/2020       Lab Results   Component Value Date    NA 139 05/27/2020    K 2.9 (L) 05/27/2020    CL 103 05/27/2020    CO2 26.0 05/27/2020    BUN 13 05/27/2020    CREATININE 1.14 (H) 05/27/2020    GLU 93 05/27/2020    CALCIUM 9.1 05/27/2020    MG 1.8 05/27/2020    PHOS 3.7 05/09/2020       Lab Results   Component Value Date    BILITOT 0.9 05/27/2020    BILIDIR 0.30 05/09/2020    PROT 7.3 05/27/2020    ALBUMIN 3.7 05/27/2020    ALT 26 05/27/2020    AST 15 05/27/2020    ALKPHOS 155 (H) 05/27/2020       No results found for: LABPROT, INR, APTT

## 2020-07-08 ENCOUNTER — Inpatient Hospital Stay: Payer: PPO

## 2020-07-08 ENCOUNTER — Inpatient Hospital Stay (HOSPITAL_BASED_OUTPATIENT_CLINIC_OR_DEPARTMENT_OTHER): Payer: PPO | Admitting: Hospice and Palliative Medicine

## 2020-07-08 ENCOUNTER — Inpatient Hospital Stay (HOSPITAL_BASED_OUTPATIENT_CLINIC_OR_DEPARTMENT_OTHER): Payer: PPO | Admitting: Oncology

## 2020-07-08 ENCOUNTER — Encounter: Payer: Self-pay | Admitting: Oncology

## 2020-07-08 ENCOUNTER — Inpatient Hospital Stay: Payer: PPO | Attending: Oncology

## 2020-07-08 VITALS — BP 114/78 | HR 71 | Resp 18

## 2020-07-08 VITALS — BP 114/64 | HR 80 | Temp 96.0°F | Resp 18 | Wt 127.5 lb

## 2020-07-08 DIAGNOSIS — D709 Neutropenia, unspecified: Secondary | ICD-10-CM | POA: Insufficient documentation

## 2020-07-08 DIAGNOSIS — E876 Hypokalemia: Secondary | ICD-10-CM

## 2020-07-08 DIAGNOSIS — D6959 Other secondary thrombocytopenia: Secondary | ICD-10-CM | POA: Insufficient documentation

## 2020-07-08 DIAGNOSIS — E860 Dehydration: Secondary | ICD-10-CM | POA: Diagnosis not present

## 2020-07-08 DIAGNOSIS — E7800 Pure hypercholesterolemia, unspecified: Secondary | ICD-10-CM | POA: Diagnosis not present

## 2020-07-08 DIAGNOSIS — C9200 Acute myeloblastic leukemia, not having achieved remission: Secondary | ICD-10-CM

## 2020-07-08 DIAGNOSIS — K219 Gastro-esophageal reflux disease without esophagitis: Secondary | ICD-10-CM | POA: Insufficient documentation

## 2020-07-08 DIAGNOSIS — R410 Disorientation, unspecified: Secondary | ICD-10-CM | POA: Insufficient documentation

## 2020-07-08 DIAGNOSIS — Z85850 Personal history of malignant neoplasm of thyroid: Secondary | ICD-10-CM | POA: Insufficient documentation

## 2020-07-08 DIAGNOSIS — Z853 Personal history of malignant neoplasm of breast: Secondary | ICD-10-CM | POA: Insufficient documentation

## 2020-07-08 DIAGNOSIS — Z515 Encounter for palliative care: Secondary | ICD-10-CM

## 2020-07-08 DIAGNOSIS — R197 Diarrhea, unspecified: Secondary | ICD-10-CM | POA: Diagnosis not present

## 2020-07-08 DIAGNOSIS — Z85038 Personal history of other malignant neoplasm of large intestine: Secondary | ICD-10-CM | POA: Insufficient documentation

## 2020-07-08 DIAGNOSIS — Z5111 Encounter for antineoplastic chemotherapy: Secondary | ICD-10-CM | POA: Insufficient documentation

## 2020-07-08 DIAGNOSIS — E039 Hypothyroidism, unspecified: Secondary | ICD-10-CM | POA: Insufficient documentation

## 2020-07-08 DIAGNOSIS — Z79899 Other long term (current) drug therapy: Secondary | ICD-10-CM | POA: Diagnosis not present

## 2020-07-08 LAB — CBC WITH DIFFERENTIAL/PLATELET
Abs Immature Granulocytes: 0 10*3/uL (ref 0.00–0.07)
Band Neutrophils: 0 %
Basophils Absolute: 0 10*3/uL (ref 0.0–0.1)
Basophils Relative: 0 %
Blasts: 0 %
Eosinophils Absolute: 0 10*3/uL (ref 0.0–0.5)
Eosinophils Relative: 0 %
HCT: 37.1 % (ref 36.0–46.0)
Hemoglobin: 12.3 g/dL (ref 12.0–15.0)
Lymphocytes Relative: 70 %
Lymphs Abs: 0.6 10*3/uL — ABNORMAL LOW (ref 0.7–4.0)
MCH: 28 pg (ref 26.0–34.0)
MCHC: 33.2 g/dL (ref 30.0–36.0)
MCV: 84.5 fL (ref 80.0–100.0)
Metamyelocytes Relative: 0 %
Monocytes Absolute: 0 10*3/uL — ABNORMAL LOW (ref 0.1–1.0)
Monocytes Relative: 0 %
Myelocytes: 0 %
Neutro Abs: 0.2 10*3/uL — CL (ref 1.7–7.7)
Neutrophils Relative %: 30 %
Other: 0 %
Platelets: 111 10*3/uL — ABNORMAL LOW (ref 150–400)
Promyelocytes Relative: 0 %
RBC: 4.39 MIL/uL (ref 3.87–5.11)
RDW: 17.2 % — ABNORMAL HIGH (ref 11.5–15.5)
Smear Review: DECREASED
WBC: 0.8 10*3/uL — CL (ref 4.0–10.5)
nRBC: 0 % (ref 0.0–0.2)
nRBC: 0 /100 WBC

## 2020-07-08 LAB — COMPREHENSIVE METABOLIC PANEL
ALT: 12 U/L (ref 0–44)
AST: 13 U/L — ABNORMAL LOW (ref 15–41)
Albumin: 3.5 g/dL (ref 3.5–5.0)
Alkaline Phosphatase: 89 U/L (ref 38–126)
Anion gap: 10 (ref 5–15)
BUN: 15 mg/dL (ref 8–23)
CO2: 23 mmol/L (ref 22–32)
Calcium: 8.4 mg/dL — ABNORMAL LOW (ref 8.9–10.3)
Chloride: 108 mmol/L (ref 98–111)
Creatinine, Ser: 1.04 mg/dL — ABNORMAL HIGH (ref 0.44–1.00)
GFR, Estimated: 58 mL/min — ABNORMAL LOW (ref >60)
Glucose, Bld: 112 mg/dL — ABNORMAL HIGH (ref 70–99)
Potassium: 2.9 mmol/L — ABNORMAL LOW (ref 3.5–5.1)
Sodium: 141 mmol/L (ref 135–145)
Total Bilirubin: 1.1 mg/dL (ref 0.3–1.2)
Total Protein: 6.3 g/dL — ABNORMAL LOW (ref 6.5–8.1)

## 2020-07-08 LAB — MAGNESIUM: Magnesium: 1.3 mg/dL — ABNORMAL LOW (ref 1.7–2.4)

## 2020-07-08 LAB — SAMPLE TO BLOOD BANK

## 2020-07-08 MED ORDER — POTASSIUM CHLORIDE CRYS ER 10 MEQ PO TBCR
40.0000 meq | EXTENDED_RELEASE_TABLET | Freq: Two times a day (BID) | ORAL | Status: DC
  Administered 2020-07-08: 40 meq via ORAL
  Filled 2020-07-08: qty 4

## 2020-07-08 MED ORDER — MAGNESIUM SULFATE 4 GM/100ML IV SOLN: 4.0000 g | Freq: Once | INTRAVENOUS | Status: AC

## 2020-07-08 MED ORDER — POTASSIUM CHLORIDE IN NACL 20-0.9 MEQ/L-% IV SOLN
Freq: Once | INTRAVENOUS | Status: AC
  Filled 2020-07-08: qty 1000

## 2020-07-08 MED ORDER — MAGNESIUM SULFATE 4 GM/100ML IV SOLN
4.0000 g | Freq: Once | INTRAVENOUS | Status: DC
  Administered 2020-07-08: 4 g via INTRAVENOUS

## 2020-07-08 MED ORDER — MAGNESIUM SULFATE 4 GM/100ML IV SOLN
INTRAVENOUS | Status: AC
  Filled 2020-07-08: qty 100

## 2020-07-08 MED ORDER — MAGNESIUM SULFATE 2 GM/50ML IV SOLN
INTRAVENOUS | Status: AC
  Filled 2020-07-08: qty 50

## 2020-07-08 MED ORDER — SODIUM CHLORIDE 0.9 % IV SOLN: 20.0000 meq | Freq: Once | INTRAVENOUS | Status: DC

## 2020-07-08 MED ORDER — POTASSIUM CHLORIDE CRYS ER 10 MEQ PO TBCR: 40.0000 meq | EXTENDED_RELEASE_TABLET | Freq: Once | ORAL | Status: DC

## 2020-07-08 NOTE — Progress Notes (Signed)
[facility]  [phone] [phone]   Name: Debra Hale Date: 07/08/2020 MRN: [id number]  DOB: 02-Jul-1950  Patient Care Team: [name], MD as PCP - General (Internal Medicine) Wellington Hampshire, MD as PCP - Cardiology (Cardiology)    REASON FOR CONSULTATION: Debra Hale is a 70 y.o. 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  Patient has had recent diarrhea and was seen in the ER on 06/17/2020 with anorectal pain.  CT findings were consistent with ischio rectal abscess and patient subsequently underwent I&D in the OR.  Patient has had persistent weakness with limited social support.  Palliative care was requested to help coordinate care.   SOCIAL HISTORY:     reports that she has never smoked. She has never used smokeless tobacco. She reports that she does not drink alcohol and does not use drugs.  Patient is unmarried.  She has no children.  She has some siblings but not nearby.  She has a couple of friends who are involved.  Patient previously worked in an office setting.  ADVANCE DIRECTIVES:  None on file  CODE STATUS:   PAST MEDICAL HISTORY: Past Medical History:  Diagnosis Date  . Anxiety   . Arthritis    Osteoarthritis  . Breast cancer ([place]) 2009   right breast lumpectomy with rad tx  . Colon cancer ([place])    surgery with chemo and rad tx  . Complication of anesthesia   . GERD (gastroesophageal reflux disease)   . Heart palpitations   . History of hiatal hernia   . History of kidney stones   . Hypothyroidism   . Liver disease   . Liver nodule    s/p negative biopsy  . Malignant neoplasm of thyroid gland ([place]) 2002   s/p surgery and XRT  . Osteoporosis   . Other and unspecified hyperlipidemia   . Palpitations   . Personal history of chemotherapy   . Personal history of malignant neoplasm of large intestine    carcinoma -  cecum, s/p right laparoscopic colectomy - s/p chemotherapy and XRT  . Personal history of radiation therapy   . Pneumonia 2019  . PONV (postoperative nausea and vomiting)   . Pure hypercholesterolemia   . Unspecified hereditary and idiopathic peripheral neuropathy     PAST SURGICAL HISTORY:  Past Surgical History:  Procedure Laterality Date  . APPENDECTOMY  1985  . BREAST BIOPSY Right 2009   positive  . BREAST BIOPSY Right 2009   negative  . BREAST LUMPECTOMY Right 2009   breast cancer  . CHOLECYSTECTOMY  1995  . COLONOSCOPY    . COLONOSCOPY WITH PROPOFOL N/A 10/29/2017   Procedure: COLONOSCOPY WITH PROPOFOL;  Surgeon: Manya Silvas, MD;  Location: [facility] ENDOSCOPY;  Service: Endoscopy;  Laterality: N/A;  . DILATION AND CURETTAGE OF UTERUS  1990  . DILATION AND CURETTAGE, DIAGNOSTIC / THERAPEUTIC  1990  . ESOPHAGOGASTRODUODENOSCOPY    . ESOPHAGOGASTRODUODENOSCOPY (EGD) WITH PROPOFOL N/A 10/29/2017   Procedure: ESOPHAGOGASTRODUODENOSCOPY (EGD) WITH PROPOFOL;  Surgeon: Manya Silvas, MD;  Location: [facility] ENDOSCOPY;  Service: Endoscopy;  Laterality: N/A;  . INCISION AND DRAINAGE PERIRECTAL ABSCESS N/A 06/17/2020   Procedure: IRRIGATION AND DEBRIDEMENT PERIRECTAL ABSCESS;  Surgeon: Jules Husbands, MD;  Location: [facility] ORS;  Service: General;  Laterality: N/A;  . LAPAROSCOPIC PARTIAL COLECTOMY     stage 3-C carcinoma of the cecum, s/p chemotherapy and xrt  .  LITHOTRIPSY    . SIGMOIDOSCOPY  08/26/1993  . THYROID LOBECTOMY  2002   s/p XRT  . TOTAL HIP ARTHROPLASTY Right 10/24/2019   Procedure: TOTAL HIP ARTHROPLASTY;  Surgeon: Corky Mull, MD;  Location: [facility] ORS;  Service: Orthopedics;  Laterality: Right;    HEMATOLOGY/ONCOLOGY HISTORY:  Oncology History  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[place])  04/12/2020 Initial Diagnosis   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[place])   06/10/2020 -  Chemotherapy    Patient is on Treatment Plan: AML AZACITIDINE SQ D1-7 Q28D        ALLERGIES:   is allergic to demeclocycline, sulfa antibiotics, tetracyclines & related, augmentin [amoxicillin-pot clavulanate], bentyl [dicyclomine hcl], ciprofloxacin, codeine, epinephrine, flagyl [metronidazole], librax [chlordiazepoxide-clidinium], novocain [procaine], phenobarbital, prednisone, and ultram [tramadol].  MEDICATIONS:  Current Outpatient Medications  Medication Sig Dispense Refill  . clonazePAM (KLONOPIN) 0.5 MG tablet Take 1.5 mg by mouth 2 (two) times daily.   0  . diphenoxylate-atropine (LOMOTIL) 2.5-0.025 MG tablet Take 1 tablet by mouth 4 (four) times daily as needed for diarrhea or loose stools. 120 tablet 0  . escitalopram (LEXAPRO) 20 MG tablet Take 20 mg by mouth every morning.     Marland Kitchen levofloxacin (LEVAQUIN) 500 MG tablet Take 1 tablet (500 mg total) by mouth daily. 30 tablet 0  . levothyroxine (SYNTHROID) 125 MCG tablet TAKE 1 TABLET BY MOUTH EVERY DAY 90 tablet 1  . Misc. Devices (SITZ BATH) MISC 10 Packages by Does not apply route in the morning, at noon, in the evening, and at bedtime. (Patient not taking: Reported on 06/26/2020) 10 each 1  . oxyCODONE-acetaminophen (PERCOCET) 7.5-325 MG tablet Take 2 tablets by mouth every 6 (six) hours as needed for severe pain. 20 tablet 0  . pantoprazole (PROTONIX) 40 MG tablet TAKE 1 TABLET BY MOUTH EVERY DAY 90 tablet 2  . potassium chloride SA (KLOR-CON) 20 MEQ tablet Take 1 tablet (20 mEq total) by mouth 2 (two) times daily. 21 tablet 0  . valACYclovir (VALTREX) 500 MG tablet Take 500 mg by mouth daily.    Marland Kitchen venetoclax (VENCLEXTA) 100 MG tablet Take 100 mg by mouth daily. Tablets should be swallowed whole with a meal and a full glass of water.     Current Facility-Administered Medications  Medication Dose Route Frequency Provider Last Rate Last Admin  . ondansetron (ZOFRAN) tablet 8 mg  8 mg Oral Once Guse, Jacquelynn Cree, FNP       Facility-Administered Medications Ordered in Other Visits  Medication Dose Route Frequency Provider Last Rate  Last Admin  . 0.9 % NaCl with KCl 20 mEq/ L  infusion   Intravenous Once Lloyd Huger, MD 500 mL/hr at 07/08/20 1104 New Bag at 07/08/20 1104  . magnesium sulfate IVPB 4 g 100 mL  4 g Intravenous Once Lloyd Huger, MD      . potassium chloride (KLOR-CON) CR tablet 40 mEq  40 mEq Oral BID Lloyd Huger, MD   40 mEq at 07/08/20 1136    VITAL SIGNS: There were no vitals taken for this visit. There were no vitals filed for this visit.  Estimated body mass index is 21.89 kg/m as calculated from the following:   Height as of 06/19/20: 5\' 4"  (1.626 m).   Weight as of an earlier encounter on 07/08/20: 127 lb 8 oz (57.8 kg).  LABS: CBC:    Component Value Date/Time   WBC 0.8 (LL) 07/08/2020 0921   HGB 12.3 07/08/2020 0921   HGB 12.5 05/09/2018  1147   HCT 37.1 07/08/2020 0921   HCT 36.8 05/09/2018 1147   PLT 111 (L) 07/08/2020 0921   PLT 285 05/09/2018 1147   MCV 84.5 07/08/2020 0921   MCV 86 05/09/2018 1147   MCV 89 11/30/2013 1419   NEUTROABS 0.2 (LL) 07/08/2020 0921   NEUTROABS 2.9 11/20/2013 1404   LYMPHSABS 0.6 (L) 07/08/2020 0921   LYMPHSABS 0.9 (L) 11/20/2013 1404   MONOABS 0.0 (L) 07/08/2020 0921   MONOABS 0.2 11/20/2013 1404   EOSABS 0.0 07/08/2020 0921   EOSABS 0.1 11/20/2013 1404   BASOSABS 0.0 07/08/2020 0921   BASOSABS 0.0 11/20/2013 1404   Comprehensive Metabolic Panel:    Component Value Date/Time   NA 141 07/08/2020 0921   NA 143 05/09/2018 1147   NA 145 11/30/2013 1419   K 2.9 (L) 07/08/2020 0921   K 3.5 11/30/2013 1419   CL 108 07/08/2020 0921   CL 109 (H) 11/30/2013 1419   CO2 23 07/08/2020 0921   CO2 29 11/30/2013 1419   BUN 15 07/08/2020 0921   BUN 11 05/09/2018 1147   BUN 15 11/30/2013 1419   CREATININE 1.04 (H) 07/08/2020 0921   CREATININE 1.04 11/30/2013 1419   GLUCOSE 112 (H) 07/08/2020 0921   GLUCOSE 97 11/30/2013 1419   CALCIUM 8.4 (L) 07/08/2020 0921   CALCIUM 8.7 11/30/2013 1419   AST 13 (L) 07/08/2020 0921   AST 28  11/30/2013 1419   ALT 12 07/08/2020 0921   ALT 71 (H) 11/30/2013 1419   ALKPHOS 89 07/08/2020 0921   ALKPHOS 114 11/30/2013 1419   BILITOT 1.1 07/08/2020 0921   BILITOT 0.7 11/30/2013 1419   PROT 6.3 (L) 07/08/2020 0921   PROT 7.1 11/30/2013 1419   ALBUMIN 3.5 07/08/2020 0921   ALBUMIN 3.3 (L) 11/30/2013 1419    RADIOGRAPHIC STUDIES: CT PELVIS W CONTRAST  Result Date: 06/16/2020 CLINICAL DATA:  Abscess, anal or rectal Patient reports pain since Friday. EXAM: CT PELVIS WITH CONTRAST TECHNIQUE: Multidetector CT imaging of the pelvis was performed using the standard protocol following the bolus administration of intravenous contrast. CONTRAST:  166mL OMNIPAQUE IOHEXOL 300 MG/ML  SOLN COMPARISON:  Abdominopelvic CT 07/21/2017 FINDINGS: Urinary Tract: Urinary bladder is unremarkable. No bladder wall thickening. Bowel: Thick-walled peripherally enhancing a perirectal collection measures 3.3 x 2.7 x 2.9 cm. This is just to the left of midline with adjacent inflammatory changes extending along the left gluteal crease. No internal or soft tissue air. No inflammation of pelvic bowel loops. Surgical anastomosis noted in the central abdomen. Vascular/Lymphatic: No acute vascular findings. No enlarged lymph nodes. Reproductive:  Quiescent uterus and ovaries.  No adnexal mass. Other:  No pelvic free fluid.  No inguinal hernia. Musculoskeletal: Right hip arthroplasty with subsequent streak artifact. Left hip osteoarthritis. No focal bone lesion or acute osseous abnormality. IMPRESSION: Perirectal abscess measuring 3.3 x 2.7 x 2.9 cm just to the left of midline. Inflammatory changes extend inferiorly to involve the left gluteal crease. Electronically Signed   By: Keith Rake M.D.   On: 06/16/2020 22:31    PERFORMANCE STATUS (ECOG) : 1 - Symptomatic but completely ambulatory  Review of Systems Unless otherwise noted, a complete review of systems is negative.  Physical Exam General: NAD Pulmonary:  Unlabored Extremities: no edema, no joint deformities Skin: no rashes Neurological: Weakness but otherwise nonfocal  IMPRESSION: Patient continues to endorse confusion regarding her medications and treatment plan.  Today, she says that her [facility] oncologist told her she only has 4 to  5 weeks to live.  Upon review of Dr. Nena Alexander note, it appears that intensive induction chemotherapy inpatient would be associated with higher mortality risk.  Patient ultimately decided to undergo azacitidine/ven with a median overall survival of 17.5 months.  However, it does not appear that patient has been taking the venetoclax as directed.  [facility] saw her today and refilled her pillbox.  Patient reports that her goals are aligned with continued treatment.  She says that as long as treatments are offered and considered beneficial, she would want to pursue them.  Would be helpful for patient to have neurocognitive evaluation by neurology given her poor processing and memory deficits?  PLAN: -Continue current scope of treatment -Consider referral to neurology for neurocognitive evaluation -ACP/MOST form previous reviewed -MyChart visit 1 month   Patient expressed understanding and was in agreement with this plan. She also understands that She can call the clinic at any time with any questions, concerns, or complaints.     Time Total: 15 minutes  Visit consisted of counseling and education dealing with the complex and emotionally intense issues of symptom management and palliative care in the setting of serious and potentially life-threatening illness.Greater than 50%  of this time was spent counseling and coordinating care related to the above assessment and plan.  Signed by: Altha Harm, PhD, NP-C

## 2020-07-08 NOTE — Progress Notes (Signed)
Patient tolerated KCL and Magnesium infusion well today, no concerns voiced. Patient given PO potassium also, no issues with swallowing. Patient educated on medication administration at home with NP. Patient discharged. Stable.

## 2020-07-08 NOTE — Progress Notes (Signed)
[place]  Telephone:(336) [phone] Fax:(336) [phone]  ID: Debra Hale OB: 18-May-1950  MR#: [id number]  UYQ#:[id number]  Patient Care Team: [name], MD as PCP - General (Internal Medicine) Wellington Hampshire, MD as PCP - Cardiology (Cardiology)  CHIEF COMPLAINT: AML.  INTERVAL HISTORY: Patient returns to clinic today for repeat laboratory work and further evaluation.  She was last seen in clinic on 06/26/2020.  She received her first dose of Vidaza at [facility] on 06/03/2020 + venetoclax.  Per treatment plan she is scheduled to receive Vidaza  D1-7 q 28 days.   She received cycle 2 Vidaza from 06/10/2020-06/14/2020.   Treatment complicated by perirectal abscess. She had an I&D in the OR with Dr. Dahlia Byes on 06/17/2020. She was discharged home on Augmentin.  She has chronic intermittent diarrhea and takes Lomotil and Imodium as needed.  She states she had 5 episodes of diarrhea just last night alone.  Today, she feels well.  She has completed antibiotics for her perirectal abscess.  She has been seen in our clinic to help with medication management and has had several "pillboxes" made for her.  States that she is taking as directed.  She is anxious and is having trouble with her memory.  REVIEW OF SYSTEMS:   Review of Systems  Constitutional: Positive for malaise/fatigue. Negative for fever and weight loss.  Respiratory: Negative.  Negative for cough, hemoptysis and shortness of breath.   Cardiovascular: Negative.  Negative for chest pain and leg swelling.  Gastrointestinal: Positive for diarrhea. Negative for abdominal pain.  Genitourinary: Negative.  Negative for dysuria.  Musculoskeletal: Negative.  Negative for back pain.  Skin: Negative.  Negative for rash.  Neurological: Positive for weakness. Negative for dizziness, focal weakness and headaches.  Psychiatric/Behavioral: Positive for memory loss. The patient is nervous/anxious.     As per HPI. Otherwise, a  complete review of systems is negative.  PAST MEDICAL HISTORY: Past Medical History:  Diagnosis Date  . Anxiety   . Arthritis    Osteoarthritis  . Breast cancer ([place]) 2009   right breast lumpectomy with rad tx  . Colon cancer ([place])    surgery with chemo and rad tx  . Complication of anesthesia   . GERD (gastroesophageal reflux disease)   . Heart palpitations   . History of hiatal hernia   . History of kidney stones   . Hypothyroidism   . Liver disease   . Liver nodule    s/p negative biopsy  . Malignant neoplasm of thyroid gland ([place]) 2002   s/p surgery and XRT  . Osteoporosis   . Other and unspecified hyperlipidemia   . Palpitations   . Personal history of chemotherapy   . Personal history of malignant neoplasm of large intestine    carcinoma - cecum, s/p right laparoscopic colectomy - s/p chemotherapy and XRT  . Personal history of radiation therapy   . Pneumonia 2019  . PONV (postoperative nausea and vomiting)   . Pure hypercholesterolemia   . Unspecified hereditary and idiopathic peripheral neuropathy     PAST SURGICAL HISTORY: Past Surgical History:  Procedure Laterality Date  . APPENDECTOMY  1985  . BREAST BIOPSY Right 2009   positive  . BREAST BIOPSY Right 2009   negative  . BREAST LUMPECTOMY Right 2009   breast cancer  . CHOLECYSTECTOMY  1995  . COLONOSCOPY    . COLONOSCOPY WITH PROPOFOL N/A 10/29/2017   Procedure: COLONOSCOPY WITH PROPOFOL;  Surgeon: Manya Silvas, MD;  Location:  [facility];  Service: Endoscopy;  Laterality: N/A;  . DILATION AND CURETTAGE OF UTERUS  1990  . DILATION AND CURETTAGE, DIAGNOSTIC / THERAPEUTIC  1990  . ESOPHAGOGASTRODUODENOSCOPY    . ESOPHAGOGASTRODUODENOSCOPY (EGD) WITH PROPOFOL N/A 10/29/2017   Procedure: ESOPHAGOGASTRODUODENOSCOPY (EGD) WITH PROPOFOL;  Surgeon: Manya Silvas, MD;  Location: [facility] ENDOSCOPY;  Service: Endoscopy;  Laterality: N/A;  . INCISION AND DRAINAGE PERIRECTAL ABSCESS N/A 06/17/2020    Procedure: IRRIGATION AND DEBRIDEMENT PERIRECTAL ABSCESS;  Surgeon: Jules Husbands, MD;  Location: [facility] ORS;  Service: General;  Laterality: N/A;  . LAPAROSCOPIC PARTIAL COLECTOMY     stage 3-C carcinoma of the cecum, s/p chemotherapy and xrt  . LITHOTRIPSY    . SIGMOIDOSCOPY  08/26/1993  . THYROID LOBECTOMY  2002   s/p XRT  . TOTAL HIP ARTHROPLASTY Right 10/24/2019   Procedure: TOTAL HIP ARTHROPLASTY;  Surgeon: Corky Mull, MD;  Location: [facility] ORS;  Service: Orthopedics;  Laterality: Right;    FAMILY HISTORY: Family History  Problem Relation Age of Onset  . Stroke Mother        53s  . Alzheimer's disease Mother   . Cancer Mother   . Lung cancer Father   . Prostate cancer Father   . Cancer Father        Colon  . Colon cancer Father   . Breast cancer Sister        68's  . Lung cancer Sister   . Breast cancer Maternal Aunt     ADVANCED DIRECTIVES (Y/N):  N  HEALTH MAINTENANCE: Social History   Tobacco Use  . Smoking status: Never Smoker  . Smokeless tobacco: Never Used  Vaping Use  . Vaping Use: Never used  Substance Use Topics  . Alcohol use: No    Alcohol/week: 0.0 standard drinks  . Drug use: No     Colonoscopy:  PAP:  Bone density:  Lipid panel:  Allergies  Allergen Reactions  . Demeclocycline Other (See Comments)    Throat swells  . Sulfa Antibiotics     Other reaction(s): Other (See Comments) Throat swells  . Tetracyclines & Related Other (See Comments)    Throat swells   . Augmentin [Amoxicillin-Pot Clavulanate] Diarrhea  . Bentyl [Dicyclomine Hcl]     unkn  . Ciprofloxacin Diarrhea  . Codeine Other (See Comments)    dizziness    . Epinephrine     Speeds heart up - panic   . Flagyl [Metronidazole] Nausea And Vomiting  . Librax [Chlordiazepoxide-Clidinium]     unkn  . Novocain [Procaine] Other (See Comments)    "Shaky"  . Phenobarbital     unkn  . Prednisone     Nervous    . Ultram [Tramadol] Other (See Comments)    Sick feeling     Current Outpatient Medications  Medication Sig Dispense Refill  . clonazePAM (KLONOPIN) 0.5 MG tablet Take 1.5 mg by mouth 2 (two) times daily.   0  . diphenoxylate-atropine (LOMOTIL) 2.5-0.025 MG tablet Take 1 tablet by mouth 4 (four) times daily as needed for diarrhea or loose stools. 120 tablet 0  . escitalopram (LEXAPRO) 20 MG tablet Take 20 mg by mouth every morning.     Marland Kitchen levofloxacin (LEVAQUIN) 500 MG tablet Take 1 tablet (500 mg total) by mouth daily. 30 tablet 0  . levothyroxine (SYNTHROID) 125 MCG tablet TAKE 1 TABLET BY MOUTH EVERY DAY 90 tablet 1  . Misc. Devices (SITZ BATH) MISC 10 Packages by Does not apply route  in the morning, at noon, in the evening, and at bedtime. (Patient not taking: Reported on 06/26/2020) 10 each 1  . oxyCODONE-acetaminophen (PERCOCET) 7.5-325 MG tablet Take 2 tablets by mouth every 6 (six) hours as needed for severe pain. 20 tablet 0  . pantoprazole (PROTONIX) 40 MG tablet TAKE 1 TABLET BY MOUTH EVERY DAY 90 tablet 2  . potassium chloride SA (KLOR-CON) 20 MEQ tablet Take 1 tablet (20 mEq total) by mouth 2 (two) times daily. 21 tablet 0  . valACYclovir (VALTREX) 500 MG tablet Take 500 mg by mouth daily.    Marland Kitchen venetoclax (VENCLEXTA) 100 MG tablet Take 100 mg by mouth daily. Tablets should be swallowed whole with a meal and a full glass of water.     Current Facility-Administered Medications  Medication Dose Route Frequency Provider Last Rate Last Admin  . ondansetron (ZOFRAN) tablet 8 mg  8 mg Oral Once Guse, Jacquelynn Cree, FNP        OBJECTIVE: There were no vitals filed for this visit.   There is no height or weight on file to calculate BMI.    ECOG FS:1 - Symptomatic but completely ambulatory  Physical Exam Constitutional:      General: Vital signs are normal.     Appearance: Normal appearance.  HENT:     Head: Normocephalic and atraumatic.  Eyes:     Pupils: Pupils are equal, round, and reactive to light.  Cardiovascular:     Rate and Rhythm:  Normal rate and regular rhythm.     Heart sounds: Normal heart sounds. No murmur heard.   Pulmonary:     Effort: Pulmonary effort is normal.     Breath sounds: Normal breath sounds. No wheezing.  Abdominal:     General: Bowel sounds are normal. There is no distension.     Palpations: Abdomen is soft.     Tenderness: There is no abdominal tenderness.  Musculoskeletal:        General: No edema. Normal range of motion.     Cervical back: Normal range of motion.  Skin:    General: Skin is warm and dry.     Findings: No rash.  Neurological:     Mental Status: She is alert and oriented to person, place, and time.  Psychiatric:        Judgment: Judgment normal.      LAB RESULTS:  Lab Results  Component Value Date   NA 142 06/26/2020   K 3.2 (L) 06/26/2020   CL 108 06/26/2020   CO2 23 06/26/2020   GLUCOSE 97 06/26/2020   BUN 15 06/26/2020   CREATININE 0.94 06/26/2020   CALCIUM 9.0 06/26/2020   PROT 6.7 06/26/2020   ALBUMIN 3.7 06/26/2020   AST 11 (L) 06/26/2020   ALT 16 06/26/2020   ALKPHOS 80 06/26/2020   BILITOT 0.8 06/26/2020   GFRNONAA >60 06/26/2020   GFRAA 54 (L) 10/26/2019    Lab Results  Component Value Date   WBC 2.5 (L) 06/26/2020   NEUTROABS 1.7 06/26/2020   HGB 12.8 06/26/2020   HCT 39.9 06/26/2020   MCV 84.7 06/26/2020   PLT 211 06/26/2020     STUDIES: CT PELVIS W CONTRAST  Result Date: 06/16/2020 CLINICAL DATA:  Abscess, anal or rectal Patient reports pain since Friday. EXAM: CT PELVIS WITH CONTRAST TECHNIQUE: Multidetector CT imaging of the pelvis was performed using the standard protocol following the bolus administration of intravenous contrast. CONTRAST:  112m OMNIPAQUE IOHEXOL 300 MG/ML  SOLN COMPARISON:  Abdominopelvic CT 07/21/2017 FINDINGS: Urinary Tract: Urinary bladder is unremarkable. No bladder wall thickening. Bowel: Thick-walled peripherally enhancing a perirectal collection measures 3.3 x 2.7 x 2.9 cm. This is just to the left of  midline with adjacent inflammatory changes extending along the left gluteal crease. No internal or soft tissue air. No inflammation of pelvic bowel loops. Surgical anastomosis noted in the central abdomen. Vascular/Lymphatic: No acute vascular findings. No enlarged lymph nodes. Reproductive:  Quiescent uterus and ovaries.  No adnexal mass. Other:  No pelvic free fluid.  No inguinal hernia. Musculoskeletal: Right hip arthroplasty with subsequent streak artifact. Left hip osteoarthritis. No focal bone lesion or acute osseous abnormality. IMPRESSION: Perirectal abscess measuring 3.3 x 2.7 x 2.9 cm just to the left of midline. Inflammatory changes extend inferiorly to involve the left gluteal crease. Electronically Signed   By: Keith Rake M.D.   On: 06/16/2020 22:31    ASSESSMENT: AML.  PLAN:   1.  AML:  -Status post induction treatment at [facility]. -Started treatment with venetoclax and Vidaza on 05/02/20 as an inpatient secondary to transportation to and from [facility]. -She received ramp-up treatment from 05/03/2020-05/09/20 as inpatient and did well.  -Repeat bone marrow at [facility] consistent with disease remission. -Cycle 2 venetoclax and Vidaza was given from 06/10/2020-06/14/2020 (5 days vidaza and venetoclax  d/t abcess) -Treatment complicated by perianal abscess.  Venetoclax was held.  Had I&D with Dr. Perrin Maltese on 06/17/2020 and was discharged home on Augmentin.  -Labs from today 07/08/2020 show potassium of 2.9, magnesium 1.3, WBC 0.8 with ANC of 200. -Patient is unsure of what medications she is taking but she is currently out of her venetoclax. -Recommend holding venetoclax and Vidaza and will bring her back in 1 week to repeat her lab work.  2.  Hypokalemia: -Secondary to diarrhea and dehydration -Proceed with 20 mEq IV and 40 mEq po potassium today. -Continue 20 mEq potassium daily. -Continue Imodium for diarrhea. -Encouraged hydration.   3.  Hypomagnesemia: -Secondary to diarrhea. -Recommend 4 g  of IV magnesium today. -She is currently on oral magnesium tablets.  I will discontinue these at this time given the significant diarrhea.  We will replace with 4 g of IV magnesium and repeat labs next week.  3. 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 -Received chemo with Adriamycin-unknown exact regimen. -Completed 5 years of hormonal therapy in June 2015. -Most recent mammogram from November 2021 was reported as BI-RADS 1. -Repeat mammogram in November 2022.  4.  History of colon cancer:  -Patient also states that she received chemotherapy for this, possibly FOLFOX but again this is unknown.   Disposition: -No treatment today. -She will receive 4 g IV magnesium and 20 mEq potassium IV.  -She also will receive 40 mEq p.o. potassium while in clinic. -We will hold magnesium supplements secondary to diarrhea. -She will continue 20 mEq potassium supplements daily. -We will hold venetoclax and Vidaza for 1 week secondary to severe neutropenia. -She is to continue Levaquin prophylaxis daily. -Take Imodium/Lomotil as needed for diarrhea.  Patient expressed understanding and was in agreement with this plan. She also understands that She can call clinic at any time with any questions, concerns, or complaints.   Greater than 50% was spent in counseling and coordination of care with this patient including but not limited to discussion of the relevant topics above (See A&P) including,  but not limited to diagnosis and management of acute and chronic medical conditions.    Cancer Staging History of breast cancer Staging form: Breast, AJCC 7th Edition - Clinical stage from 01/07/2016: Stage IA (T1c, N0, M0) - Signed by Lloyd Huger, MD on 01/07/2016 Laterality: Right Estrogen receptor status: Positive Progesterone receptor status:  Positive HER2 status: Negative   Jacquelin Hawking, NP   07/08/2020 9:08 AM

## 2020-07-09 ENCOUNTER — Inpatient Hospital Stay: Payer: PPO

## 2020-07-10 ENCOUNTER — Inpatient Hospital Stay: Payer: PPO

## 2020-07-10 ENCOUNTER — Encounter: Payer: PPO | Admitting: Surgery

## 2020-07-11 ENCOUNTER — Ambulatory Visit: Payer: PPO

## 2020-07-12 ENCOUNTER — Ambulatory Visit: Payer: PPO

## 2020-07-15 ENCOUNTER — Other Ambulatory Visit: Payer: Self-pay | Admitting: Student

## 2020-07-15 ENCOUNTER — Inpatient Hospital Stay (HOSPITAL_BASED_OUTPATIENT_CLINIC_OR_DEPARTMENT_OTHER): Payer: PPO | Admitting: Oncology

## 2020-07-15 ENCOUNTER — Inpatient Hospital Stay: Payer: PPO

## 2020-07-15 ENCOUNTER — Telehealth: Payer: Self-pay

## 2020-07-15 VITALS — BP 111/67 | HR 79 | Temp 97.0°F | Resp 17 | Wt 126.4 lb

## 2020-07-15 DIAGNOSIS — C9200 Acute myeloblastic leukemia, not having achieved remission: Secondary | ICD-10-CM | POA: Diagnosis not present

## 2020-07-15 DIAGNOSIS — Z5111 Encounter for antineoplastic chemotherapy: Secondary | ICD-10-CM | POA: Diagnosis not present

## 2020-07-15 LAB — CBC WITH DIFFERENTIAL/PLATELET
Abs Immature Granulocytes: 0 10*3/uL (ref 0.00–0.07)
Basophils Absolute: 0 10*3/uL (ref 0.0–0.1)
Basophils Relative: 0 %
Eosinophils Absolute: 0 10*3/uL (ref 0.0–0.5)
Eosinophils Relative: 2 %
HCT: 38 % (ref 36.0–46.0)
Hemoglobin: 12.9 g/dL (ref 12.0–15.0)
Immature Granulocytes: 0 %
Lymphocytes Relative: 48 %
Lymphs Abs: 0.5 10*3/uL — ABNORMAL LOW (ref 0.7–4.0)
MCH: 28.5 pg (ref 26.0–34.0)
MCHC: 33.9 g/dL (ref 30.0–36.0)
MCV: 84.1 fL (ref 80.0–100.0)
Monocytes Absolute: 0.1 10*3/uL (ref 0.1–1.0)
Monocytes Relative: 8 %
Neutro Abs: 0.4 10*3/uL — CL (ref 1.7–7.7)
Neutrophils Relative %: 42 %
Platelets: 117 10*3/uL — ABNORMAL LOW (ref 150–400)
RBC: 4.52 MIL/uL (ref 3.87–5.11)
RDW: 15.9 % — ABNORMAL HIGH (ref 11.5–15.5)
Smear Review: DECREASED
WBC: 1.1 10*3/uL — CL (ref 4.0–10.5)
nRBC: 0 % (ref 0.0–0.2)

## 2020-07-15 LAB — COMPREHENSIVE METABOLIC PANEL
ALT: 15 U/L (ref 0–44)
AST: 15 U/L (ref 15–41)
Albumin: 3.6 g/dL (ref 3.5–5.0)
Alkaline Phosphatase: 96 U/L (ref 38–126)
Anion gap: 8 (ref 5–15)
BUN: 15 mg/dL (ref 8–23)
CO2: 25 mmol/L (ref 22–32)
Calcium: 8.9 mg/dL (ref 8.9–10.3)
Chloride: 106 mmol/L (ref 98–111)
Creatinine, Ser: 0.92 mg/dL (ref 0.44–1.00)
GFR, Estimated: >60 mL/min (ref >60)
Glucose, Bld: 101 mg/dL — ABNORMAL HIGH (ref 70–99)
Potassium: 3.1 mmol/L — ABNORMAL LOW (ref 3.5–5.1)
Sodium: 139 mmol/L (ref 135–145)
Total Bilirubin: 0.9 mg/dL (ref 0.3–1.2)
Total Protein: 6.3 g/dL — ABNORMAL LOW (ref 6.5–8.1)

## 2020-07-15 LAB — MAGNESIUM: Magnesium: 1.6 mg/dL — ABNORMAL LOW (ref 1.7–2.4)

## 2020-07-15 NOTE — Progress Notes (Signed)
Portal  [phone] [phone]  ID: Debra Hale OB: Sep 30, 1950  MR#: [id number]  QMG#:[id number]  Patient Care Team: [name], MD as PCP - General (Internal Medicine) Wellington Hampshire, MD as PCP - Cardiology (Cardiology)  CHIEF COMPLAINT: AML.  INTERVAL HISTORY: Patient returns to clinic today for repeat laboratory work and further evaluation.  She was last seen in clinic on 07/08/20.  She received her first dose of Vidaza at [facility] on 06/03/2020 + venetoclax.  Per treatment plan she is scheduled to receive Vidaza  D1-7 q 28 days.   She received cycle 2 Vidaza from 06/10/2020-06/14/2020.   Treatment complicated by perirectal abscess. She had an I&D in the OR with Dr. Dahlia Byes on 06/17/2020. She was discharged home on Augmentin.  Treatment held last week for severe neutropenia and electrolyte abnormalities with ANC 200, WBC 800 and potassium 2.9 and magnesium 1.3. She received IV Potassium and magnesium and sent home with PO potassium.   She returns today for reconsideration of cycle 3 Vidaza plus venetoclax.  Venetoclax has been on hold for a little over a week now.  Her diarrhea has improved since we discontinued her magnesium supplements.  Yesterday she had 1 loose bowel movement that did not appear to be liquid.  She has been compliant with her daily medications per her pillbox I filled last week.  Her appetite continues to be decreased but she is eating as able.  REVIEW OF SYSTEMS:   Review of Systems  Constitutional: Positive for malaise/fatigue. Negative for fever and weight loss.  Respiratory: Negative.  Negative for cough, hemoptysis and shortness of breath.   Cardiovascular: Negative.  Negative for chest pain and leg swelling.  Gastrointestinal: Positive for diarrhea. Negative for abdominal pain.  Genitourinary: Negative.  Negative for dysuria.  Musculoskeletal: Negative.  Negative for back pain.  Skin: Negative.  Negative for rash.   Neurological: Positive for weakness. Negative for dizziness, focal weakness and headaches.  Psychiatric/Behavioral: Positive for memory loss. The patient is nervous/anxious.     As per HPI. Otherwise, a complete review of systems is negative.  PAST MEDICAL HISTORY: Past Medical History:  Diagnosis Date  . Anxiety   . Arthritis    Osteoarthritis  . Breast cancer ([place]) 2009   right breast lumpectomy with rad tx  . Colon cancer ([place])    surgery with chemo and rad tx  . Complication of anesthesia   . GERD (gastroesophageal reflux disease)   . Heart palpitations   . History of hiatal hernia   . History of kidney stones   . Hypothyroidism   . Liver disease   . Liver nodule    s/p negative biopsy  . Malignant neoplasm of thyroid gland ([place]) 2002   s/p surgery and XRT  . Osteoporosis   . Other and unspecified hyperlipidemia   . Palpitations   . Personal history of chemotherapy   . Personal history of malignant neoplasm of large intestine    carcinoma - cecum, s/p right laparoscopic colectomy - s/p chemotherapy and XRT  . Personal history of radiation therapy   . Pneumonia 2019  . PONV (postoperative nausea and vomiting)   . Pure hypercholesterolemia   . Unspecified hereditary and idiopathic peripheral neuropathy     PAST SURGICAL HISTORY: Past Surgical History:  Procedure Laterality Date  . APPENDECTOMY  1985  . BREAST BIOPSY Right 2009   positive  . BREAST BIOPSY Right 2009   negative  . BREAST LUMPECTOMY Right 2009  breast cancer  . CHOLECYSTECTOMY  1995  . COLONOSCOPY    . COLONOSCOPY WITH PROPOFOL N/A 10/29/2017   Procedure: COLONOSCOPY WITH PROPOFOL;  Surgeon: Manya Silvas, MD;  Location: [facility] ENDOSCOPY;  Service: Endoscopy;  Laterality: N/A;  . DILATION AND CURETTAGE OF UTERUS  1990  . DILATION AND CURETTAGE, DIAGNOSTIC / THERAPEUTIC  1990  . ESOPHAGOGASTRODUODENOSCOPY    . ESOPHAGOGASTRODUODENOSCOPY (EGD) WITH PROPOFOL N/A 10/29/2017   Procedure:  ESOPHAGOGASTRODUODENOSCOPY (EGD) WITH PROPOFOL;  Surgeon: Manya Silvas, MD;  Location: [facility] ENDOSCOPY;  Service: Endoscopy;  Laterality: N/A;  . INCISION AND DRAINAGE PERIRECTAL ABSCESS N/A 06/17/2020   Procedure: IRRIGATION AND DEBRIDEMENT PERIRECTAL ABSCESS;  Surgeon: Jules Husbands, MD;  Location: [facility] ORS;  Service: General;  Laterality: N/A;  . LAPAROSCOPIC PARTIAL COLECTOMY     stage 3-C carcinoma of the cecum, s/p chemotherapy and xrt  . LITHOTRIPSY    . SIGMOIDOSCOPY  08/26/1993  . THYROID LOBECTOMY  2002   s/p XRT  . TOTAL HIP ARTHROPLASTY Right 10/24/2019   Procedure: TOTAL HIP ARTHROPLASTY;  Surgeon: Corky Mull, MD;  Location: [facility] ORS;  Service: Orthopedics;  Laterality: Right;    FAMILY HISTORY: Family History  Problem Relation Age of Onset  . Stroke Mother        20s  . Alzheimer's disease Mother   . Cancer Mother   . Lung cancer Father   . Prostate cancer Father   . Cancer Father        Colon  . Colon cancer Father   . Breast cancer Sister        79's  . Lung cancer Sister   . Breast cancer Maternal Aunt     ADVANCED DIRECTIVES (Y/N):  N  HEALTH MAINTENANCE: Social History   Tobacco Use  . Smoking status: Never Smoker  . Smokeless tobacco: Never Used  Vaping Use  . Vaping Use: Never used  Substance Use Topics  . Alcohol use: No    Alcohol/week: 0.0 standard drinks  . Drug use: No     Colonoscopy:  PAP:  Bone density:  Lipid panel:  Allergies  Allergen Reactions  . Demeclocycline Other (See Comments)    Throat swells  . Sulfa Antibiotics     Other reaction(s): Other (See Comments) Throat swells  . Tetracyclines & Related Other (See Comments)    Throat swells   . Augmentin [Amoxicillin-Pot Clavulanate] Diarrhea  . Bentyl [Dicyclomine Hcl]     unkn  . Ciprofloxacin Diarrhea  . Codeine Other (See Comments)    dizziness    . Epinephrine     Speeds heart up - panic   . Flagyl [Metronidazole] Nausea And Vomiting  . Librax  [Chlordiazepoxide-Clidinium]     unkn  . Novocain [Procaine] Other (See Comments)    "Shaky"  . Phenobarbital     unkn  . Prednisone     Nervous    . Ultram [Tramadol] Other (See Comments)    Sick feeling    Current Outpatient Medications  Medication Sig Dispense Refill  . clonazePAM (KLONOPIN) 0.5 MG tablet Take 1.5 mg by mouth 2 (two) times daily.   0  . diphenoxylate-atropine (LOMOTIL) 2.5-0.025 MG tablet Take 1 tablet by mouth 4 (four) times daily as needed for diarrhea or loose stools. 120 tablet 0  . escitalopram (LEXAPRO) 20 MG tablet Take 20 mg by mouth every morning.     Marland Kitchen levofloxacin (LEVAQUIN) 500 MG tablet Take 1 tablet (500 mg total) by mouth daily. [place]  tablet 0  . levothyroxine (SYNTHROID) 125 MCG tablet TAKE 1 TABLET BY MOUTH EVERY DAY 90 tablet 1  . Misc. Devices (SITZ BATH) MISC 10 Packages by Does not apply route in the morning, at noon, in the evening, and at bedtime. (Patient not taking: Reported on 06/26/2020) 10 each 1  . oxyCODONE-acetaminophen (PERCOCET) 7.5-325 MG tablet Take 2 tablets by mouth every 6 (six) hours as needed for severe pain. 20 tablet 0  . pantoprazole (PROTONIX) 40 MG tablet TAKE 1 TABLET BY MOUTH EVERY DAY 90 tablet 2  . potassium chloride SA (KLOR-CON) 20 MEQ tablet Take 1 tablet (20 mEq total) by mouth 2 (two) times daily. 21 tablet 0  . valACYclovir (VALTREX) 500 MG tablet Take 500 mg by mouth daily.    Marland Kitchen venetoclax (VENCLEXTA) 100 MG tablet Take 100 mg by mouth daily. Tablets should be swallowed whole with a meal and a full glass of water.     Current Facility-Administered Medications  Medication Dose Route Frequency Provider Last Rate Last Admin  . ondansetron (ZOFRAN) tablet 8 mg  8 mg Oral Once Guse, Jacquelynn Cree, FNP       Facility-Administered Medications Ordered in Other Visits  Medication Dose Route Frequency Provider Last Rate Last Admin  . potassium chloride (KLOR-CON) CR tablet 40 mEq  40 mEq Oral BID Lloyd Huger, MD   40  mEq at 07/08/20 1136    OBJECTIVE: There were no vitals filed for this visit.   There is no height or weight on file to calculate BMI.    ECOG FS:1 - Symptomatic but completely ambulatory  Physical Exam Constitutional:      Appearance: Normal appearance.  HENT:     Head: Normocephalic and atraumatic.  Eyes:     Pupils: Pupils are equal, round, and reactive to light.  Cardiovascular:     Rate and Rhythm: Normal rate and regular rhythm.     Heart sounds: Normal heart sounds. No murmur heard.   Pulmonary:     Effort: Pulmonary effort is normal.     Breath sounds: Normal breath sounds. No wheezing.  Abdominal:     General: Bowel sounds are normal. There is no distension.     Palpations: Abdomen is soft.     Tenderness: There is no abdominal tenderness.  Musculoskeletal:        General: Normal range of motion.     Cervical back: Normal range of motion.  Skin:    General: Skin is warm and dry.     Findings: No rash.  Neurological:     Mental Status: She is alert and oriented to person, place, and time.  Psychiatric:        Judgment: Judgment normal.      LAB RESULTS:  Lab Results  Component Value Date   NA 141 07/08/2020   K 2.9 (L) 07/08/2020   CL 108 07/08/2020   CO2 23 07/08/2020   GLUCOSE 112 (H) 07/08/2020   BUN 15 07/08/2020   CREATININE 1.04 (H) 07/08/2020   CALCIUM 8.4 (L) 07/08/2020   PROT 6.3 (L) 07/08/2020   ALBUMIN 3.5 07/08/2020   AST 13 (L) 07/08/2020   ALT 12 07/08/2020   ALKPHOS 89 07/08/2020   BILITOT 1.1 07/08/2020   GFRNONAA 58 (L) 07/08/2020   GFRAA 54 (L) 10/26/2019    Lab Results  Component Value Date   WBC 0.8 (LL) 07/08/2020   NEUTROABS 0.2 (LL) 07/08/2020   HGB 12.3 07/08/2020   HCT 37.1 07/08/2020  MCV 84.5 07/08/2020   PLT 111 (L) 07/08/2020     STUDIES: CT PELVIS W CONTRAST  Result Date: 06/16/2020 CLINICAL DATA:  Abscess, anal or rectal Patient reports pain since Friday. EXAM: CT PELVIS WITH CONTRAST TECHNIQUE:  Multidetector CT imaging of the pelvis was performed using the standard protocol following the bolus administration of intravenous contrast. CONTRAST:  174m OMNIPAQUE IOHEXOL 300 MG/ML  SOLN COMPARISON:  Abdominopelvic CT 07/21/2017 FINDINGS: Urinary Tract: Urinary bladder is unremarkable. No bladder wall thickening. Bowel: Thick-walled peripherally enhancing a perirectal collection measures 3.3 x 2.7 x 2.9 cm. This is just to the left of midline with adjacent inflammatory changes extending along the left gluteal crease. No internal or soft tissue air. No inflammation of pelvic bowel loops. Surgical anastomosis noted in the central abdomen. Vascular/Lymphatic: No acute vascular findings. No enlarged lymph nodes. Reproductive:  Quiescent uterus and ovaries.  No adnexal mass. Other:  No pelvic free fluid.  No inguinal hernia. Musculoskeletal: Right hip arthroplasty with subsequent streak artifact. Left hip osteoarthritis. No focal bone lesion or acute osseous abnormality. IMPRESSION: Perirectal abscess measuring 3.3 x 2.7 x 2.9 cm just to the left of midline. Inflammatory changes extend inferiorly to involve the left gluteal crease. Electronically Signed   By: MKeith RakeM.D.   On: 06/16/2020 22:31    ASSESSMENT: AML.  PLAN:   1.  AML:  -[facility] -[facility]ed treatment with venetoclax and Vidaza on 05/02/20 as an inpatient [facility] -She received ramp-up treatment from 05/03/2020-05/09/20 as inpatient and did well.  [facility]. -Cycle 2 venetoclax and Vidaza was given from 06/10/2020-06/14/2020 (5 days vidaza and venetoclax  d/t abcess) -Treatment complicated by perianal abscess.  Venetoclax was held.  Had I&D with Dr. PPerrin Malteseon 06/17/2020 and was discharged home on Augmentin.  -Labs from today 07/08/2020 show potassium of 2.9, magnesium 1.3, WBC 0.8 with ANC of 200. -Treatment was held and she  was asked to return in 1 week to redraw labs.  -Labs from 07/15/2020 show a potassium of 3.1, magnesium 1.7, WBC 1.1 and ANC of 400. -[facility]  Hold Parameters: Hold treatment for platelets < 25 and ANC < 0.5. Patients with prolonged cytopenias (i.e. > two  weeks) should have a bone marrow biopsy.   Dose reductions: If warranted due to prolonged neutrophil or platelet recovery  . First: Reduce VEN to 21 days (from 28 days)  . Second: Reduce AZA to 5 days (from 7 days)   . Third: Reduce VEN to 14 days (from 21 days)  -Spoke with Dr. FGrayland Ormondwho recommends continuing to hold treatment and have stat bone marrow biopsy completed. -Orders placed and scheduled for tomorrow at 10 AM.  2.  Hypokalemia: -Potassium improved since last week. -Secondary to diarrhea and dehydration -Continue 20 mEq potassium daily. -Continue Imodium for diarrhea. -Encouraged hydration.  3.  Hypomagnesemia: -Secondary to diarrhea. -Continue to hold oral magnesium tablets.  Magnesium level improved today.  3. 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 -Received chemo with Adriamycin-unknown exact regimen. -Completed 5 years of hormonal therapy in June 2015. -Most recent mammogram from November 2021 was reported as BI-RADS 1. -Repeat mammogram in November 2022.  4.  History of colon cancer:  -Patient also states that she received chemotherapy for this, possibly  FOLFOX but again this is unknown.  Disposition: -No treatment today. -Schedule stat bone marrow biopsy- 07/16/20 at 10am -We will have her return to clinic after bone marrow biopsy for repeat lab work and results. -Continue to hold Vidaza and venetoclax due to neutropenia. -She is to continue Levaquin prophylaxis daily. -Take Imodium/Lomotil as needed for diarrhea.  Patient expressed  understanding and was in agreement with this plan. She also understands that She can call clinic at any time with any questions, concerns, or complaints.   Greater than 50% was spent in counseling and coordination of care with this patient including but not limited to discussion of the relevant topics above (See A&P) including, but not limited to diagnosis and management of acute and chronic medical conditions.    Cancer Staging History of breast cancer Staging form: Breast, AJCC 7th Edition - Clinical stage from 01/07/2016: Stage IA (T1c, N0, M0) - Signed by Lloyd Huger, MD on 01/07/2016 Laterality: Right Estrogen receptor status: Positive Progesterone receptor status: Positive HER2 status: Negative   Jacquelin Hawking, NP   07/15/2020 9:34 AM

## 2020-07-15 NOTE — Telephone Encounter (Signed)
Patient notified of date and arrival time for bone marrow biopsy scheduled on 07/16/20. Verbalized understanding.

## 2020-07-15 NOTE — Progress Notes (Signed)
Patient placed on schedule for BMB 07/16/2020, called and spoke with patient. Made aware to be here @ 0900, NPO after MN tonight and driver for discharge post procedure/recovery. Stated understanding.

## 2020-07-16 ENCOUNTER — Other Ambulatory Visit: Payer: Self-pay

## 2020-07-16 ENCOUNTER — Ambulatory Visit
Admission: RE | Admit: 2020-07-16 | Discharge: 2020-07-16 | Disposition: A | Discharge status: Home / Self Care | Payer: PPO | Source: Ambulatory Visit | Attending: Oncology | Admitting: Oncology

## 2020-07-16 ENCOUNTER — Inpatient Hospital Stay: Payer: PPO

## 2020-07-16 DIAGNOSIS — D6949 Other primary thrombocytopenia: Secondary | ICD-10-CM | POA: Insufficient documentation

## 2020-07-16 DIAGNOSIS — C9200 Acute myeloblastic leukemia, not having achieved remission: Secondary | ICD-10-CM

## 2020-07-16 DIAGNOSIS — C9590 Leukemia, unspecified not having achieved remission: Secondary | ICD-10-CM | POA: Diagnosis not present

## 2020-07-16 DIAGNOSIS — D709 Neutropenia, unspecified: Secondary | ICD-10-CM | POA: Insufficient documentation

## 2020-07-16 LAB — CBC WITH DIFFERENTIAL/PLATELET
Abs Immature Granulocytes: 0.01 10*3/uL (ref 0.00–0.07)
Basophils Absolute: 0 10*3/uL (ref 0.0–0.1)
Basophils Relative: 0 %
Eosinophils Absolute: 0 10*3/uL (ref 0.0–0.5)
Eosinophils Relative: 2 %
HCT: 38.6 % (ref 36.0–46.0)
Hemoglobin: 12.6 g/dL (ref 12.0–15.0)
Immature Granulocytes: 1 %
Lymphocytes Relative: 50 %
Lymphs Abs: 0.6 10*3/uL — ABNORMAL LOW (ref 0.7–4.0)
MCH: 27.9 pg (ref 26.0–34.0)
MCHC: 32.6 g/dL (ref 30.0–36.0)
MCV: 85.6 fL (ref 80.0–100.0)
Monocytes Absolute: 0.1 10*3/uL (ref 0.1–1.0)
Monocytes Relative: 6 %
Neutro Abs: 0.5 10*3/uL — ABNORMAL LOW (ref 1.7–7.7)
Neutrophils Relative %: 41 %
Platelets: 129 10*3/uL — ABNORMAL LOW (ref 150–400)
RBC: 4.51 MIL/uL (ref 3.87–5.11)
RDW: 15.9 % — ABNORMAL HIGH (ref 11.5–15.5)
Smear Review: DECREASED
WBC: 1.3 10*3/uL — CL (ref 4.0–10.5)
nRBC: 0 % (ref 0.0–0.2)

## 2020-07-16 MED ORDER — SODIUM CHLORIDE 0.9 % IV SOLN
INTRAVENOUS | Status: DC
  Administered 2020-07-16: 1000 mL via INTRAVENOUS

## 2020-07-16 MED ORDER — HEPARIN SOD (PORK) LOCK FLUSH 100 UNIT/ML IV SOLN
INTRAVENOUS | Status: AC
  Filled 2020-07-16: qty 5

## 2020-07-16 MED ORDER — MIDAZOLAM HCL 2 MG/2ML IJ SOLN
INTRAMUSCULAR | Status: AC
  Filled 2020-07-16: qty 2

## 2020-07-16 MED ORDER — MIDAZOLAM HCL 2 MG/2ML IJ SOLN
INTRAMUSCULAR | Status: AC | PRN
  Administered 2020-07-16: 1 mg via INTRAVENOUS

## 2020-07-16 NOTE — Procedures (Signed)
  Procedure: CT guided bone marrow biopsy  R iliac EBL:   minimal Complications:  none immediate  See full dictation in [facility].  Dillard Cannon MD Main # [phone] Pager  [phone] Mobile [phone]

## 2020-07-17 ENCOUNTER — Inpatient Hospital Stay: Payer: PPO

## 2020-07-18 ENCOUNTER — Inpatient Hospital Stay: Payer: PPO

## 2020-07-18 LAB — SURGICAL PATHOLOGY

## 2020-07-19 ENCOUNTER — Inpatient Hospital Stay: Payer: PPO

## 2020-07-19 NOTE — Progress Notes (Signed)
[place]  [phone] [phone]  ID: Debra Hale OB: 1950/10/04  MR#: [id number]  CSN#:[id number]  Patient Care Team: [name], MD as PCP - General (Internal Medicine) Wellington Hampshire, MD as PCP - Cardiology (Cardiology)  CHIEF COMPLAINT: AML.  INTERVAL HISTORY: Patient returns to clinic today for further evaluation, discussion of her bone marrow biopsy results, and treatment planning.  She continues to have chronic weakness and fatigue as well as baseline confusion, but otherwise feels well.  Her perirectal abscess is essentially resolved.  She continues to have diarrhea.  She has no neurologic complaints.  She denies any recent fevers or illnesses.  She has a fair appetite and denies weight loss.  She has no chest pain, shortness of breath, cough, or hemoptysis.  She denies any nausea, vomiting, or constipation.  She has no urinary complaints.  Patient offers no further specific complaints today.  REVIEW OF SYSTEMS:   Review of Systems  Constitutional: Positive for malaise/fatigue. Negative for fever and weight loss.  Respiratory: Negative.  Negative for cough, hemoptysis and shortness of breath.   Cardiovascular: Negative.  Negative for chest pain and leg swelling.  Gastrointestinal: Positive for diarrhea. Negative for abdominal pain.  Genitourinary: Negative.  Negative for dysuria.  Musculoskeletal: Negative.  Negative for back pain.  Skin: Negative.  Negative for rash.  Neurological: Positive for weakness. Negative for dizziness, focal weakness and headaches.  Psychiatric/Behavioral: Positive for memory loss. The patient is nervous/anxious.     As per HPI. Otherwise, a complete review of systems is negative.  PAST MEDICAL HISTORY: Past Medical History:  Diagnosis Date  . Anxiety   . Arthritis    Osteoarthritis  . Breast cancer ([place]) 2009   right breast lumpectomy with rad tx  . Colon cancer ([place])    surgery with chemo  and rad tx  . Complication of anesthesia   . GERD (gastroesophageal reflux disease)   . Heart palpitations   . History of hiatal hernia   . History of kidney stones   . Hypothyroidism   . Liver disease   . Liver nodule    s/p negative biopsy  . Malignant neoplasm of thyroid gland ([place]) 2002   s/p surgery and XRT  . Osteoporosis   . Other and unspecified hyperlipidemia   . Palpitations   . Personal history of chemotherapy   . Personal history of malignant neoplasm of large intestine    carcinoma - cecum, s/p right laparoscopic colectomy - s/p chemotherapy and XRT  . Personal history of radiation therapy   . Pneumonia 2019  . PONV (postoperative nausea and vomiting)   . Pure hypercholesterolemia   . Unspecified hereditary and idiopathic peripheral neuropathy     PAST SURGICAL HISTORY: Past Surgical History:  Procedure Laterality Date  . APPENDECTOMY  1985  . BREAST BIOPSY Right 2009   positive  . BREAST BIOPSY Right 2009   negative  . BREAST LUMPECTOMY Right 2009   breast cancer  . CHOLECYSTECTOMY  1995  . COLONOSCOPY    . COLONOSCOPY WITH PROPOFOL N/A 10/29/2017   Procedure: COLONOSCOPY WITH PROPOFOL;  Surgeon: Manya Silvas, MD;  Location: [facility] ENDOSCOPY;  Service: Endoscopy;  Laterality: N/A;  . DILATION AND CURETTAGE OF UTERUS  1990  . DILATION AND CURETTAGE, DIAGNOSTIC / THERAPEUTIC  1990  . ESOPHAGOGASTRODUODENOSCOPY    . ESOPHAGOGASTRODUODENOSCOPY (EGD) WITH PROPOFOL N/A 10/29/2017   Procedure: ESOPHAGOGASTRODUODENOSCOPY (EGD) WITH PROPOFOL;  Surgeon: Manya Silvas, MD;  Location: [facility] ENDOSCOPY;  Service: Endoscopy;  Laterality: N/A;  . INCISION AND DRAINAGE PERIRECTAL ABSCESS N/A 06/17/2020   Procedure: IRRIGATION AND DEBRIDEMENT PERIRECTAL ABSCESS;  Surgeon: Jules Husbands, MD;  Location: [facility] ORS;  Service: General;  Laterality: N/A;  . LAPAROSCOPIC PARTIAL COLECTOMY     stage 3-C carcinoma of the cecum, s/p chemotherapy and xrt  . LITHOTRIPSY    .  SIGMOIDOSCOPY  08/26/1993  . THYROID LOBECTOMY  2002   s/p XRT  . TOTAL HIP ARTHROPLASTY Right 10/24/2019   Procedure: TOTAL HIP ARTHROPLASTY;  Surgeon: Corky Mull, MD;  Location: [facility] ORS;  Service: Orthopedics;  Laterality: Right;    FAMILY HISTORY: Family History  Problem Relation Age of Onset  . Stroke Mother        49s  . Alzheimer's disease Mother   . Cancer Mother   . Lung cancer Father   . Prostate cancer Father   . Cancer Father        Colon  . Colon cancer Father   . Breast cancer Sister        87's  . Lung cancer Sister   . Breast cancer Maternal Aunt     ADVANCED DIRECTIVES (Y/N):  N  HEALTH MAINTENANCE: Social History   Tobacco Use  . Smoking status: Never Smoker  . Smokeless tobacco: Never Used  Vaping Use  . Vaping Use: Never used  Substance Use Topics  . Alcohol use: No    Alcohol/week: 0.0 standard drinks  . Drug use: No     Colonoscopy:  PAP:  Bone density:  Lipid panel:  Allergies  Allergen Reactions  . Demeclocycline Other (See Comments)    Throat swells  . Sulfa Antibiotics     Other reaction(s): Other (See Comments) Throat swells  . Tetracyclines & Related Other (See Comments)    Throat swells   . Augmentin [Amoxicillin-Pot Clavulanate] Diarrhea  . Bentyl [Dicyclomine Hcl]     unkn  . Ciprofloxacin Diarrhea  . Codeine Other (See Comments)    dizziness    . Epinephrine     Speeds heart up - panic   . Flagyl [Metronidazole] Nausea And Vomiting  . Librax [Chlordiazepoxide-Clidinium]     unkn  . Novocain [Procaine] Other (See Comments)    "Shaky"  . Phenobarbital     unkn  . Prednisone     Nervous    . Ultram [Tramadol] Other (See Comments)    Sick feeling    Current Outpatient Medications  Medication Sig Dispense Refill  . clonazePAM (KLONOPIN) 0.5 MG tablet Take 1.5 mg by mouth 2 (two) times daily.   0  . diphenoxylate-atropine (LOMOTIL) 2.5-0.025 MG tablet Take 1 tablet by mouth 4 (four) times daily as needed  for diarrhea or loose stools. 120 tablet 0  . escitalopram (LEXAPRO) 20 MG tablet Take 20 mg by mouth every morning.     Marland Kitchen levofloxacin (LEVAQUIN) 500 MG tablet Take 1 tablet (500 mg total) by mouth daily. 30 tablet 0  . levothyroxine (SYNTHROID) 125 MCG tablet TAKE 1 TABLET BY MOUTH EVERY DAY 90 tablet 1  . magnesium oxide (MAG-OX) 400 MG tablet Take 400 mg by mouth daily.    . pantoprazole (PROTONIX) 40 MG tablet TAKE 1 TABLET BY MOUTH EVERY DAY 90 tablet 2  . potassium chloride SA (KLOR-CON) 20 MEQ tablet Take 1 tablet (20 mEq total) by mouth 2 (two) times daily. 21 tablet 0  . valACYclovir (VALTREX) 500 MG tablet Take 500 mg by mouth daily.    Marland Kitchen  Misc. Devices (SITZ BATH) MISC 10 Packages by Does not apply route in the morning, at noon, in the evening, and at bedtime. (Patient not taking: No sig reported) 10 each 1  . oxyCODONE-acetaminophen (PERCOCET) 7.5-325 MG tablet Take 2 tablets by mouth every 6 (six) hours as needed for severe pain. (Patient not taking: No sig reported) 20 tablet 0  . venetoclax (VENCLEXTA) 100 MG tablet Take 100 mg by mouth daily. Tablets should be swallowed whole with a meal and a full glass of water. (Patient not taking: Reported on 07/24/2020)     Current Facility-Administered Medications  Medication Dose Route Frequency Provider Last Rate Last Admin  . ondansetron (ZOFRAN) tablet 8 mg  8 mg Oral Once Guse, Jacquelynn Cree, FNP       Facility-Administered Medications Ordered in Other Visits  Medication Dose Route Frequency Provider Last Rate Last Admin  . potassium chloride (KLOR-CON) CR tablet 40 mEq  40 mEq Oral BID Lloyd Huger, MD   40 mEq at 07/08/20 1136    OBJECTIVE: Vitals:   07/24/20 1110 07/24/20 1112  BP:  (!) 109/58  Pulse:  85  Resp: 20   Temp: (!) 97.5 F (36.4 C)      Body mass index is 21.54 kg/m.    ECOG FS:1 - Symptomatic but completely ambulatory  General: Well-developed, well-nourished, no acute distress. Eyes: Pink conjunctiva,  anicteric sclera. HEENT: Normocephalic, moist mucous membranes. Lungs: No audible wheezing or coughing. Heart: Regular rate and rhythm. Abdomen: Soft, nontender, no obvious distention. Musculoskeletal: No edema, cyanosis, or clubbing. Neuro: Alert, answering all questions appropriately. Cranial nerves grossly intact. Skin: No rashes or petechiae noted. Psych: Normal affect.   LAB RESULTS:  Lab Results  Component Value Date   NA 140 07/24/2020   K 3.3 (L) 07/24/2020   CL 106 07/24/2020   CO2 25 07/24/2020   GLUCOSE 106 (H) 07/24/2020   BUN 20 07/24/2020   CREATININE 0.98 07/24/2020   CALCIUM 8.9 07/24/2020   PROT 6.5 07/24/2020   ALBUMIN 3.8 07/24/2020   AST 18 07/24/2020   ALT 20 07/24/2020   ALKPHOS 100 07/24/2020   BILITOT 0.8 07/24/2020   GFRNONAA >60 07/24/2020   GFRAA 54 (L) 10/26/2019    Lab Results  Component Value Date   WBC 1.5 (L) 07/24/2020   NEUTROABS 0.7 (L) 07/24/2020   HGB 13.3 07/24/2020   HCT 39.9 07/24/2020   MCV 85.1 07/24/2020   PLT 117 (L) 07/24/2020     STUDIES: CT BONE MARROW BIOPSY & ASPIRATION  Result Date: 07/16/2020 CLINICAL DATA:  History of leukemia, sustained neutropenia EXAM: CT GUIDED DEEP ILIAC BONE ASPIRATION AND CORE BIOPSY TECHNIQUE: Patient was placed prone on the CT gantry and limited axial scans through the pelvis were obtained. Appropriate skin entry site was identified. Skin site was marked, prepped with chlorhexidine, draped in usual sterile fashion, and infiltrated locally with 1% lidocaine. Intravenous versed 20m were administered as anxiolysis during continuous monitoring of the patient's level of consciousness and physiological / cardiorespiratory status by the radiology RN. Under CT fluoroscopic guidance an 11-gauge Cook trocar bone needle was advanced into the right iliac bone just lateral to the sacroiliac joint. Once needle tip position was confirmed, core and aspiration samples were obtained, submitted to pathology for  approval. Post procedure scans show no hematoma or fracture. Patient tolerated procedure well. COMPLICATIONS: COMPLICATIONS none IMPRESSION: 1. Technically successful CT guided right iliac bone core and aspiration biopsy. Electronically Signed   By: DLucrezia Europe  M.D.   On: 07/16/2020 13:11    ASSESSMENT: AML.  PLAN:    1.  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  Her peripheral blood flow cytometry also revealed 6% aberrant myeloblasts.  Patient completed cycle 1 of induction therapy at [facility] with Vidaza and venetoclax.  She only received 5 days of Vidaza for cycle 2 recently which was interrupted secondary to significant diarrhea and a perirectal abscess.  Unclear of patient's compliance with venetoclax.  Repeat bone marrow biopsy on July 16, 2020 did not reveal any residual disease, but cytogenetics remained positive likely indicating a very good partial remission.  Given patient's difficulty with treatment, will discontinue venetoclax altogether and proceed with dose reduced Vidaza on days 1 through 5 on a 28-day cycle.  Return to clinic Monday, July 30, 2019 to initiate cycle 3, day 1 of treatment.     2.  Neutropenia: Chronic and unchanged.  Discontinue venetoclax and dose reduce Vidaza as above. 3.  Thrombocytopenia: Mild, monitor. 4.  Perirectal abscess: Resolved.  Patient has been instructed to complete her antibiotics as prescribed.   5.  Diarrhea: Mildly improved.  Continue Lomotil as prescribed. 6.  Supportive care: Patient has been given referrals to home health and home palliative care.  Appreciate palliative care input.   7. 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  Patient also received chemotherapy and likely received Adriamycin, but exact regimen is unknown.  She completed 5 years of hormonal therapy in approximately June  2015. Currently, she has no evidence of disease.  Her most recent mammogram on March 05, 2020 was reported as BI-RADS 1.  Repeat in November 2022. 8.  History of colon cancer: Patient also states that she received chemotherapy for this, possibly FOLFOX but again this is unknown.   Patient expressed understanding and was in agreement with this plan. She also understands that She can call clinic at any time with any questions, concerns, or complaints.   Cancer Staging History of breast cancer Staging form: Breast, AJCC 7th Edition - Clinical stage from 01/07/2016: Stage IA (T1c, N0, M0) - Signed by Lloyd Huger, MD on 01/07/2016 Laterality: Right Estrogen receptor status: Positive Progesterone receptor status: Positive HER2 status: Negative   Lloyd Huger, MD   07/24/2020 12:09 PM

## 2020-07-19 NOTE — Progress Notes (Signed)
FYI

## 2020-07-22 ENCOUNTER — Inpatient Hospital Stay: Payer: PPO

## 2020-07-23 ENCOUNTER — Encounter (HOSPITAL_COMMUNITY): Payer: Self-pay | Admitting: Oncology

## 2020-07-23 ENCOUNTER — Inpatient Hospital Stay: Payer: PPO

## 2020-07-24 ENCOUNTER — Inpatient Hospital Stay: Payer: PPO

## 2020-07-24 ENCOUNTER — Encounter: Payer: Self-pay | Admitting: Oncology

## 2020-07-24 ENCOUNTER — Inpatient Hospital Stay (HOSPITAL_BASED_OUTPATIENT_CLINIC_OR_DEPARTMENT_OTHER): Payer: PPO | Admitting: Oncology

## 2020-07-24 ENCOUNTER — Other Ambulatory Visit: Payer: Self-pay

## 2020-07-24 VITALS — BP 109/58 | HR 85 | Temp 97.5°F | Resp 20 | Wt 125.5 lb

## 2020-07-24 DIAGNOSIS — Z5111 Encounter for antineoplastic chemotherapy: Secondary | ICD-10-CM | POA: Diagnosis not present

## 2020-07-24 DIAGNOSIS — C9200 Acute myeloblastic leukemia, not having achieved remission: Secondary | ICD-10-CM | POA: Diagnosis not present

## 2020-07-24 LAB — CBC WITH DIFFERENTIAL/PLATELET
Abs Immature Granulocytes: 0 10*3/uL (ref 0.00–0.07)
Basophils Absolute: 0 10*3/uL (ref 0.0–0.1)
Basophils Relative: 0 %
Eosinophils Absolute: 0 10*3/uL (ref 0.0–0.5)
Eosinophils Relative: 2 %
HCT: 39.9 % (ref 36.0–46.0)
Hemoglobin: 13.3 g/dL (ref 12.0–15.0)
Immature Granulocytes: 0 %
Lymphocytes Relative: 44 %
Lymphs Abs: 0.7 10*3/uL (ref 0.7–4.0)
MCH: 28.4 pg (ref 26.0–34.0)
MCHC: 33.3 g/dL (ref 30.0–36.0)
MCV: 85.1 fL (ref 80.0–100.0)
Monocytes Absolute: 0.1 10*3/uL (ref 0.1–1.0)
Monocytes Relative: 7 %
Neutro Abs: 0.7 10*3/uL — ABNORMAL LOW (ref 1.7–7.7)
Neutrophils Relative %: 47 %
Platelets: 117 10*3/uL — ABNORMAL LOW (ref 150–400)
RBC: 4.69 MIL/uL (ref 3.87–5.11)
RDW: 15.1 % (ref 11.5–15.5)
WBC: 1.5 10*3/uL — ABNORMAL LOW (ref 4.0–10.5)
nRBC: 0 % (ref 0.0–0.2)

## 2020-07-24 LAB — COMPREHENSIVE METABOLIC PANEL
ALT: 20 U/L (ref 0–44)
AST: 18 U/L (ref 15–41)
Albumin: 3.8 g/dL (ref 3.5–5.0)
Alkaline Phosphatase: 100 U/L (ref 38–126)
Anion gap: 9 (ref 5–15)
BUN: 20 mg/dL (ref 8–23)
CO2: 25 mmol/L (ref 22–32)
Calcium: 8.9 mg/dL (ref 8.9–10.3)
Chloride: 106 mmol/L (ref 98–111)
Creatinine, Ser: 0.98 mg/dL (ref 0.44–1.00)
GFR, Estimated: >60 mL/min (ref >60)
Glucose, Bld: 106 mg/dL — ABNORMAL HIGH (ref 70–99)
Potassium: 3.3 mmol/L — ABNORMAL LOW (ref 3.5–5.1)
Sodium: 140 mmol/L (ref 135–145)
Total Bilirubin: 0.8 mg/dL (ref 0.3–1.2)
Total Protein: 6.5 g/dL (ref 6.5–8.1)

## 2020-07-24 LAB — SAMPLE TO BLOOD BANK

## 2020-07-24 NOTE — Progress Notes (Signed)
Patient denies any concerns today.  

## 2020-07-25 ENCOUNTER — Inpatient Hospital Stay: Payer: PPO | Admitting: Pharmacist

## 2020-07-25 ENCOUNTER — Telehealth: Payer: Self-pay | Admitting: Nurse Practitioner

## 2020-07-25 DIAGNOSIS — C9200 Acute myeloblastic leukemia, not having achieved remission: Secondary | ICD-10-CM

## 2020-07-25 NOTE — Progress Notes (Signed)
[place]  [phone] [phone]  Patient Care Team: [name], MD as PCP - General (Internal Medicine) Wellington Hampshire, MD as PCP - Cardiology (Cardiology)   Name of the patient: Debra Hale  [id number]  Mar 02, 1951   Date of visit: 07/25/20  HPI: Patient is a 70 y.o. female with AML. Currently treatment with azacitidine, venetoclax on hold.  Reason for Consult: Medication adherence visit. Patient is not currently on oral chemotherapy, today's appt is to assist with maintence medication adherence  MEDICATIONS:  Current Outpatient Medications  Medication Sig Dispense Refill  . clonazePAM (KLONOPIN) 0.5 MG tablet Take 1.5 mg by mouth 2 (two) times daily.   0  . diphenoxylate-atropine (LOMOTIL) 2.5-0.025 MG tablet Take 1 tablet by mouth 4 (four) times daily as needed for diarrhea or loose stools. 120 tablet 0  . escitalopram (LEXAPRO) 20 MG tablet Take 20 mg by mouth every morning.     Marland Kitchen levofloxacin (LEVAQUIN) 500 MG tablet Take 1 tablet (500 mg total) by mouth daily. 30 tablet 0  . levothyroxine (SYNTHROID) 125 MCG tablet TAKE 1 TABLET BY MOUTH EVERY DAY 90 tablet 1  . magnesium oxide (MAG-OX) 400 MG tablet Take 400 mg by mouth daily.    . Misc. Devices (SITZ BATH) MISC 10 Packages by Does not apply route in the morning, at noon, in the evening, and at bedtime. (Patient not taking: No sig reported) 10 each 1  . oxyCODONE-acetaminophen (PERCOCET) 7.5-325 MG tablet Take 2 tablets by mouth every 6 (six) hours as needed for severe pain. (Patient not taking: No sig reported) 20 tablet 0  . pantoprazole (PROTONIX) 40 MG tablet TAKE 1 TABLET BY MOUTH EVERY DAY 90 tablet 2  . potassium chloride SA (KLOR-CON) 20 MEQ tablet Take 1 tablet (20 mEq total) by mouth 2 (two) times daily. 21 tablet 0  . valACYclovir (VALTREX) 500 MG tablet Take 500 mg by mouth daily.    Marland Kitchen venetoclax (VENCLEXTA) 100 MG tablet Take 100 mg by  mouth daily. Tablets should be swallowed whole with a meal and a full glass of water. (Patient not taking: Reported on 07/24/2020)     Current Facility-Administered Medications  Medication Dose Route Frequency Provider Last Rate Last Admin  . ondansetron (ZOFRAN) tablet 8 mg  8 mg Oral Once Guse, Jacquelynn Cree, FNP       Facility-Administered Medications Ordered in Other Visits  Medication Dose Route Frequency Provider Last Rate Last Admin  . potassium chloride (KLOR-CON) CR tablet 40 mEq  40 mEq Oral BID Lloyd Huger, MD   40 mEq at 07/08/20 1136    VITAL SIGNS: There were no vitals taken for this visit. There were no vitals filed for this visit.  Estimated body mass index is 21.54 kg/m as calculated from the following:   Height as of 07/16/20: 5\' 4"  (1.626 m).   Weight as of 07/24/20: 56.9 kg (125 lb 8 oz).  LABS: CBC:    Component Value Date/Time   WBC 1.5 (L) 07/24/2020 1046   HGB 13.3 07/24/2020 1046   HGB 12.5 05/09/2018 1147   HCT 39.9 07/24/2020 1046   HCT 36.8 05/09/2018 1147   PLT 117 (L) 07/24/2020 1046   PLT 285 05/09/2018 1147   MCV 85.1 07/24/2020 1046   MCV 86 05/09/2018 1147   MCV 89 11/30/2013 1419   NEUTROABS 0.7 (L) 07/24/2020 1046   NEUTROABS 2.9 11/20/2013 1404   LYMPHSABS 0.7 07/24/2020 1046  LYMPHSABS 0.9 (L) 11/20/2013 1404   MONOABS 0.1 07/24/2020 1046   MONOABS 0.2 11/20/2013 1404   EOSABS 0.0 07/24/2020 1046   EOSABS 0.1 11/20/2013 1404   BASOSABS 0.0 07/24/2020 1046   BASOSABS 0.0 11/20/2013 1404   Comprehensive Metabolic Panel:    Component Value Date/Time   NA 140 07/24/2020 1046   NA 143 05/09/2018 1147   NA 145 11/30/2013 1419   K 3.3 (L) 07/24/2020 1046   K 3.5 11/30/2013 1419   CL 106 07/24/2020 1046   CL 109 (H) 11/30/2013 1419   CO2 25 07/24/2020 1046   CO2 29 11/30/2013 1419   BUN 20 07/24/2020 1046   BUN 11 05/09/2018 1147   BUN 15 11/30/2013 1419   CREATININE 0.98 07/24/2020 1046   CREATININE 1.04 11/30/2013 1419    GLUCOSE 106 (H) 07/24/2020 1046   GLUCOSE 97 11/30/2013 1419   CALCIUM 8.9 07/24/2020 1046   CALCIUM 8.7 11/30/2013 1419   AST 18 07/24/2020 1046   AST 28 11/30/2013 1419   ALT 20 07/24/2020 1046   ALT 71 (H) 11/30/2013 1419   ALKPHOS 100 07/24/2020 1046   ALKPHOS 114 11/30/2013 1419   BILITOT 0.8 07/24/2020 1046   BILITOT 0.7 11/30/2013 1419   PROT 6.5 07/24/2020 1046   PROT 7.1 11/30/2013 1419   ALBUMIN 3.8 07/24/2020 1046   ALBUMIN 3.3 (L) 11/30/2013 1419     Assessment and Plan-  During visit today patients medications were placed in bubble medication trays. Tray in separated into morning, noon, evening, and night sections for each day. Two trays were made to cover 2 weeks of medication for Ms. Waymire.  Included in the medication tray: escitalopram, clonazepam, valacyclovir, pantoprazole, levothyroxine, potassium chloride, and magnesium oxide.  The goal is to get her switched to [facility] so they can package her medications monthly. She recently filled 90 days supplies of some of her medication so until those can be refilled at [facility] with her prescription insurance she will have her trays filled by the office.  Recent 90 day fills:  -valacyclovir (can be filled again on 09/23/20) -pantoprazole (can be filled again on 09/11/20)  She needs to request refills of her escitalopram and clonazepam.  All medication and bottles were returned to Ms. Zenia Resides  Of note, she has an appt on 07/30/20 with the provider who prescribes her levothyroxine. She is not sure if her dose will be changed. She know that if her dose is change she should only get a 30 days supply filled.     Follow-up: 2 weeks on 08/06/20, will fill additional trays at that time   Patient expressed understanding and was in agreement with this plan. She also understands that She can call clinic at any time with any questions, concerns, or complaints.   Thank you for allowing me to participate in the care of this  very pleasant patient.   Time Total: 45 mins  Visit consisted of counseling and education on dealing with issues of symptom management in the setting of serious and potentially life-threatening illness.Greater than 50%  of this time was spent counseling and coordinating care related to the above assessment and plan.  Signed by: Darl Pikes, PharmD, BCPS, Salley Slaughter, CPP Hematology/Oncology Clinical Pharmacist Practitioner [facility] [phone]  07/25/2020 4:25 PM

## 2020-07-25 NOTE — Telephone Encounter (Signed)
Called patient to schedule an in-home Palliative Consult, no answer - left message with reason for call along with my name and call back number, requesting a return call to schedule visit or to let me know if she wishes not to pursue Palliative services.  Also called patient's friend, Teressa Senter, and left a message with her to ask her to let patient know that I needed to speak with her and ask her to call me back to let me know if she wishes to pursue Palliative services or not so that I can give the [place] an update on the referral.  Left my name and call back number.

## 2020-07-25 NOTE — Telephone Encounter (Signed)
Rec'd return call from Teressa Senter (patient's friend) and she said that she would be calling patient this evening to talk about Palliative services and scheduling a visit with [place].  Butch Penny did mention that patient was kid of funny about people coming into the home so I told her to let the patient know that we could do a telehealth visit if she would agree to this.  Butch Penny said that either she or the patient would call me back tomorrow.

## 2020-07-25 NOTE — Progress Notes (Signed)
[place]  [phone] [phone]) [phone]  ID: Debra Hale OB: 02-14-1951  MR#: [id number]  QAS#:[id number]  Patient Care Team: [name], MD as PCP - General (Internal Medicine) Wellington Hampshire, MD as PCP - Cardiology (Cardiology)  CHIEF COMPLAINT: AML.  INTERVAL HISTORY: Patient returns to clinic today for further evaluation and continuation of Vidaza.  She has intermittent diarrhea that is improved and well controlled with Imodium.  She otherwise feels well.  She is tolerating her treatments without significant side effects.  She has chronic weakness and fatigue and states she sleeps much of the day.  She has no neurologic complaints.  She denies any recent fevers or illnesses.  She has a fair appetite and denies weight loss.  She has no chest pain, shortness of breath, cough, or hemoptysis.  She denies any nausea, vomiting, or constipation.  She has no urinary complaints.  Patient offers no further specific complaints today.  REVIEW OF SYSTEMS:   Review of Systems  Constitutional: Positive for malaise/fatigue. Negative for fever and weight loss.  Respiratory: Negative.  Negative for cough, hemoptysis and shortness of breath.   Cardiovascular: Negative.  Negative for chest pain and leg swelling.  Gastrointestinal: Positive for diarrhea. Negative for abdominal pain.  Genitourinary: Negative.  Negative for dysuria.  Musculoskeletal: Negative.  Negative for back pain.  Skin: Negative.  Negative for rash.  Neurological: Positive for weakness. Negative for dizziness, focal weakness and headaches.  Psychiatric/Behavioral: Positive for memory loss. The patient is nervous/anxious.     As per HPI. Otherwise, a complete review of systems is negative.  PAST MEDICAL HISTORY: Past Medical History:  Diagnosis Date  . Anxiety   . Arthritis    Osteoarthritis  . Breast cancer ([place]) 2009   right breast lumpectomy with rad tx  . Colon cancer ([place])     surgery with chemo and rad tx  . Complication of anesthesia   . GERD (gastroesophageal reflux disease)   . Heart palpitations   . History of hiatal hernia   . History of kidney stones   . Hypothyroidism   . Liver disease   . Liver nodule    s/p negative biopsy  . Malignant neoplasm of thyroid gland ([place]) 2002   s/p surgery and XRT  . Osteoporosis   . Other and unspecified hyperlipidemia   . Palpitations   . Personal history of chemotherapy   . Personal history of malignant neoplasm of large intestine    carcinoma - cecum, s/p right laparoscopic colectomy - s/p chemotherapy and XRT  . Personal history of radiation therapy   . Pneumonia 2019  . PONV (postoperative nausea and vomiting)   . Pure hypercholesterolemia   . Unspecified hereditary and idiopathic peripheral neuropathy     PAST SURGICAL HISTORY: Past Surgical History:  Procedure Laterality Date  . APPENDECTOMY  1985  . BREAST BIOPSY Right 2009   positive  . BREAST BIOPSY Right 2009   negative  . BREAST LUMPECTOMY Right 2009   breast cancer  . CHOLECYSTECTOMY  1995  . COLONOSCOPY    . COLONOSCOPY WITH PROPOFOL N/A 10/29/2017   Procedure: COLONOSCOPY WITH PROPOFOL;  Surgeon: Manya Silvas, MD;  Location: [facility] ENDOSCOPY;  Service: Endoscopy;  Laterality: N/A;  . DILATION AND CURETTAGE OF UTERUS  1990  . DILATION AND CURETTAGE, DIAGNOSTIC / THERAPEUTIC  1990  . ESOPHAGOGASTRODUODENOSCOPY    . ESOPHAGOGASTRODUODENOSCOPY (EGD) WITH PROPOFOL N/A 10/29/2017   Procedure: ESOPHAGOGASTRODUODENOSCOPY (EGD) WITH PROPOFOL;  Surgeon: Gaylyn Cheers  T, MD;  Location: [facility] ENDOSCOPY;  Service: Endoscopy;  Laterality: N/A;  . INCISION AND DRAINAGE PERIRECTAL ABSCESS N/A 06/17/2020   Procedure: IRRIGATION AND DEBRIDEMENT PERIRECTAL ABSCESS;  Surgeon: Jules Husbands, MD;  Location: [facility] ORS;  Service: General;  Laterality: N/A;  . LAPAROSCOPIC PARTIAL COLECTOMY     stage 3-C carcinoma of the cecum, s/p chemotherapy and xrt  .  LITHOTRIPSY    . SIGMOIDOSCOPY  08/26/1993  . THYROID LOBECTOMY  2002   s/p XRT  . TOTAL HIP ARTHROPLASTY Right 10/24/2019   Procedure: TOTAL HIP ARTHROPLASTY;  Surgeon: Corky Mull, MD;  Location: [facility] ORS;  Service: Orthopedics;  Laterality: Right;    FAMILY HISTORY: Family History  Problem Relation Age of Onset  . Stroke Mother        61s  . Alzheimer's disease Mother   . Cancer Mother   . Lung cancer Father   . Prostate cancer Father   . Cancer Father        Colon  . Colon cancer Father   . Breast cancer Sister        39's  . Lung cancer Sister   . Breast cancer Maternal Aunt     ADVANCED DIRECTIVES (Y/N):  N  HEALTH MAINTENANCE: Social History   Tobacco Use  . Smoking status: Never Smoker  . Smokeless tobacco: Never Used  Vaping Use  . Vaping Use: Never used  Substance Use Topics  . Alcohol use: No    Alcohol/week: 0.0 standard drinks  . Drug use: No     Colonoscopy:  PAP:  Bone density:  Lipid panel:  Allergies  Allergen Reactions  . Demeclocycline Other (See Comments)    Throat swells  . Sulfa Antibiotics     Other reaction(s): Other (See Comments) Throat swells  . Tetracyclines & Related Other (See Comments)    Throat swells   . Augmentin [Amoxicillin-Pot Clavulanate] Diarrhea  . Bentyl [Dicyclomine Hcl]     unkn  . Ciprofloxacin Diarrhea  . Codeine Other (See Comments)    dizziness    . Epinephrine     Speeds heart up - panic   . Flagyl [Metronidazole] Nausea And Vomiting  . Librax [Chlordiazepoxide-Clidinium]     unkn  . Novocain [Procaine] Other (See Comments)    "Shaky"  . Phenobarbital     unkn  . Prednisone     Nervous    . Ultram [Tramadol] Other (See Comments)    Sick feeling    Current Outpatient Medications  Medication Sig Dispense Refill  . clonazePAM (KLONOPIN) 0.5 MG tablet Take 1.5 mg by mouth 2 (two) times daily.   0  . diphenoxylate-atropine (LOMOTIL) 2.5-0.025 MG tablet Take 1 tablet by mouth 4 (four) times  daily as needed for diarrhea or loose stools. 120 tablet 0  . escitalopram (LEXAPRO) 20 MG tablet Take 20 mg by mouth every morning.     Marland Kitchen levothyroxine (SYNTHROID) 125 MCG tablet TAKE 1 TABLET BY MOUTH EVERY DAY 90 tablet 1  . magnesium oxide (MAG-OX) 400 MG tablet Take 400 mg by mouth daily.    . Misc. Devices (SITZ BATH) MISC 10 Packages by Does not apply route in the morning, at noon, in the evening, and at bedtime. 10 each 1  . pantoprazole (PROTONIX) 40 MG tablet TAKE 1 TABLET BY MOUTH EVERY DAY 90 tablet 2  . potassium chloride SA (KLOR-CON) 20 MEQ tablet Take 1 tablet (20 mEq total) by mouth 2 (two) times daily. 21 tablet  0  . valACYclovir (VALTREX) 500 MG tablet Take 500 mg by mouth daily.    Marland Kitchen oxyCODONE-acetaminophen (PERCOCET) 7.5-325 MG tablet Take 2 tablets by mouth every 6 (six) hours as needed for severe pain. (Patient not taking: Reported on 07/31/2020) 20 tablet 0   Current Facility-Administered Medications  Medication Dose Route Frequency Provider Last Rate Last Admin  . ondansetron (ZOFRAN) tablet 8 mg  8 mg Oral Once Guse, Jacquelynn Cree, FNP       Facility-Administered Medications Ordered in Other Visits  Medication Dose Route Frequency Provider Last Rate Last Admin  . potassium chloride (KLOR-CON) CR tablet 40 mEq  40 mEq Oral BID Lloyd Huger, MD   40 mEq at 07/08/20 1136    OBJECTIVE: Vitals:   07/31/20 1015  BP: 107/66  Pulse: 79  Resp: 20  Temp: (!) 97.2 F (36.2 C)     Body mass index is 21.8 kg/m.    ECOG FS:1 - Symptomatic but completely ambulatory  General: Well-developed, well-nourished, no acute distress. Eyes: Pink conjunctiva, anicteric sclera. HEENT: Normocephalic, moist mucous membranes. Lungs: No audible wheezing or coughing. Heart: Regular rate and rhythm. Abdomen: Soft, nontender, no obvious distention. Musculoskeletal: No edema, cyanosis, or clubbing. Neuro: Alert, answering all questions appropriately. Cranial nerves grossly intact. Skin:  No rashes or petechiae noted. Psych: Normal affect.  LAB RESULTS:  Lab Results  Component Value Date   NA 139 07/29/2020   K 3.6 07/29/2020   CL 103 07/29/2020   CO2 26 07/29/2020   GLUCOSE 102 (H) 07/29/2020   BUN 18 07/29/2020   CREATININE 0.79 07/29/2020   CALCIUM 8.7 (L) 07/29/2020   PROT 6.4 (L) 07/29/2020   ALBUMIN 3.7 07/29/2020   AST 16 07/29/2020   ALT 24 07/29/2020   ALKPHOS 99 07/29/2020   BILITOT 1.0 07/29/2020   GFRNONAA >60 07/29/2020   GFRAA 54 (L) 10/26/2019    Lab Results  Component Value Date   WBC 1.7 (L) 07/29/2020   NEUTROABS 1.0 (L) 07/29/2020   HGB 12.8 07/29/2020   HCT 38.0 07/29/2020   MCV 85.0 07/29/2020   PLT 102 (L) 07/29/2020     STUDIES: CT BONE MARROW BIOPSY & ASPIRATION  Result Date: 07/16/2020 CLINICAL DATA:  History of leukemia, sustained neutropenia EXAM: CT GUIDED DEEP ILIAC BONE ASPIRATION AND CORE BIOPSY TECHNIQUE: Patient was placed prone on the CT gantry and limited axial scans through the pelvis were obtained. Appropriate skin entry site was identified. Skin site was marked, prepped with chlorhexidine, draped in usual sterile fashion, and infiltrated locally with 1% lidocaine. Intravenous versed 1mg  were administered as anxiolysis during continuous monitoring of the patient's level of consciousness and physiological / cardiorespiratory status by the radiology RN. Under CT fluoroscopic guidance an 11-gauge Cook trocar bone needle was advanced into the right iliac bone just lateral to the sacroiliac joint. Once needle tip position was confirmed, core and aspiration samples were obtained, submitted to pathology for approval. Post procedure scans show no hematoma or fracture. Patient tolerated procedure well. COMPLICATIONS: COMPLICATIONS none IMPRESSION: 1. Technically successful CT guided right iliac bone core and aspiration biopsy. Electronically Signed   By: Lucrezia Europe M.D.   On: 07/16/2020 13:11    ASSESSMENT: AML.  PLAN:    1.   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  Her peripheral blood flow cytometry also revealed 6% aberrant myeloblasts.  Patient completed cycle 1 of induction therapy at [facility] with Vidaza and venetoclax.  She only received 5 days of Vidaza for  cycle 2 recently which was interrupted secondary to significant diarrhea and a perirectal abscess.  Unclear of patient's compliance with venetoclax.  Repeat bone marrow biopsy on July 16, 2020 did not reveal any residual disease, but cytogenetics remained positive likely indicating a very good partial remission.  Given patient's difficulty with treatment, will discontinue venetoclax altogether and proceed with dose reduced Vidaza on days 1 through 5 on a 28-day cycle.  Proceed with cycle 3, day 3 of treatment today.  Return to clinic in 1 and 2 days for [place] only.  Patient will return to clinic in 2 weeks for laboratory work and further evaluation and then in approximately 4 weeks for further evaluation and consideration of cycle 4, day 1.   2.  Neutropenia: Chronic and unchanged.  Discontinue venetoclax and dose reduce Vidaza as above. 3.  Thrombocytopenia: Chronic and relatively unchanged.  Proceed with treatment as above. 4.  Perirectal abscess: Resolved.   5.  Diarrhea: Significantly improved since discontinuing venetoclax.  Continue Lomotil as prescribed. 6.  Supportive care: Patient has been given referrals to home health and home palliative care.  Appreciate palliative care input.   7. 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  Patient also received chemotherapy and likely received Adriamycin, but exact regimen is unknown.  She completed 5 years of hormonal therapy in approximately June 2015. Currently, she has no evidence of disease.  Her most recent mammogram on March 05, 2020 was reported as BI-RADS  1.  Repeat in November 2022. 8.  History of colon cancer: Patient also states that she received chemotherapy for this, possibly FOLFOX but again this is unknown.   Patient expressed understanding and was in agreement with this plan. She also understands that She can call clinic at any time with any questions, concerns, or complaints.   Cancer Staging History of breast cancer Staging form: Breast, AJCC 7th Edition - Clinical stage from 01/07/2016: Stage IA (T1c, N0, M0) - Signed by Lloyd Huger, MD on 01/07/2016 Laterality: Right Estrogen receptor status: Positive Progesterone receptor status: Positive HER2 status: Negative   Lloyd Huger, MD   07/31/2020 2:47 PM

## 2020-07-26 ENCOUNTER — Telehealth: Payer: Self-pay | Admitting: Nurse Practitioner

## 2020-07-26 NOTE — Telephone Encounter (Signed)
Ret'd call to patient and after discussing Palliative services with her and all questions were answered she was in agreement with scheduling a visit with NP.  I have scheduled a Telehealth Consult for 08/14/20 @ 12:30 PM.

## 2020-07-29 ENCOUNTER — Inpatient Hospital Stay: Payer: PPO

## 2020-07-29 VITALS — BP 126/73 | HR 93 | Temp 97.7°F | Resp 18 | Wt 127.5 lb

## 2020-07-29 DIAGNOSIS — C9200 Acute myeloblastic leukemia, not having achieved remission: Secondary | ICD-10-CM

## 2020-07-29 DIAGNOSIS — Z5111 Encounter for antineoplastic chemotherapy: Secondary | ICD-10-CM | POA: Diagnosis not present

## 2020-07-29 LAB — COMPREHENSIVE METABOLIC PANEL
ALT: 24 U/L (ref 0–44)
AST: 16 U/L (ref 15–41)
Albumin: 3.7 g/dL (ref 3.5–5.0)
Alkaline Phosphatase: 99 U/L (ref 38–126)
Anion gap: 10 (ref 5–15)
BUN: 18 mg/dL (ref 8–23)
CO2: 26 mmol/L (ref 22–32)
Calcium: 8.7 mg/dL — ABNORMAL LOW (ref 8.9–10.3)
Chloride: 103 mmol/L (ref 98–111)
Creatinine, Ser: 0.79 mg/dL (ref 0.44–1.00)
GFR, Estimated: >60 mL/min (ref >60)
Glucose, Bld: 102 mg/dL — ABNORMAL HIGH (ref 70–99)
Potassium: 3.6 mmol/L (ref 3.5–5.1)
Sodium: 139 mmol/L (ref 135–145)
Total Bilirubin: 1 mg/dL (ref 0.3–1.2)
Total Protein: 6.4 g/dL — ABNORMAL LOW (ref 6.5–8.1)

## 2020-07-29 LAB — CBC WITH DIFFERENTIAL/PLATELET
Abs Immature Granulocytes: 0.02 10*3/uL (ref 0.00–0.07)
Basophils Absolute: 0 10*3/uL (ref 0.0–0.1)
Basophils Relative: 0 %
Eosinophils Absolute: 0 10*3/uL (ref 0.0–0.5)
Eosinophils Relative: 1 %
HCT: 38 % (ref 36.0–46.0)
Hemoglobin: 12.8 g/dL (ref 12.0–15.0)
Immature Granulocytes: 1 %
Lymphocytes Relative: 26 %
Lymphs Abs: 0.4 10*3/uL — ABNORMAL LOW (ref 0.7–4.0)
MCH: 28.6 pg (ref 26.0–34.0)
MCHC: 33.7 g/dL (ref 30.0–36.0)
MCV: 85 fL (ref 80.0–100.0)
Monocytes Absolute: 0.1 10*3/uL (ref 0.1–1.0)
Monocytes Relative: 9 %
Neutro Abs: 1 10*3/uL — ABNORMAL LOW (ref 1.7–7.7)
Neutrophils Relative %: 63 %
Platelets: 102 10*3/uL — ABNORMAL LOW (ref 150–400)
RBC: 4.47 MIL/uL (ref 3.87–5.11)
RDW: 14.8 % (ref 11.5–15.5)
WBC: 1.7 10*3/uL — ABNORMAL LOW (ref 4.0–10.5)
nRBC: 0 % (ref 0.0–0.2)

## 2020-07-29 LAB — SAMPLE TO BLOOD BANK

## 2020-07-29 MED ORDER — AZACITIDINE CHEMO SQ INJECTION
50.0000 mg/m2 | Freq: Once | INTRAMUSCULAR | Status: AC
  Administered 2020-07-29: 87.5 mg via SUBCUTANEOUS
  Filled 2020-07-29: qty 3.5

## 2020-07-29 MED ORDER — ONDANSETRON HCL 4 MG PO TABS
8.0000 mg | ORAL_TABLET | Freq: Once | ORAL | Status: AC
  Administered 2020-07-29: 8 mg via ORAL
  Filled 2020-07-29: qty 2

## 2020-07-30 ENCOUNTER — Ambulatory Visit (INDEPENDENT_AMBULATORY_CARE_PROVIDER_SITE_OTHER): Payer: PPO | Admitting: Internal Medicine

## 2020-07-30 ENCOUNTER — Inpatient Hospital Stay: Payer: PPO

## 2020-07-30 ENCOUNTER — Encounter: Payer: Self-pay | Admitting: Internal Medicine

## 2020-07-30 ENCOUNTER — Inpatient Hospital Stay: Payer: PPO | Admitting: Oncology

## 2020-07-30 ENCOUNTER — Other Ambulatory Visit: Payer: Self-pay

## 2020-07-30 VITALS — BP 109/62 | HR 86 | Temp 98.0°F | Resp 18

## 2020-07-30 VITALS — BP 104/62 | HR 87 | Temp 97.8°F | Ht 64.0 in | Wt 128.0 lb

## 2020-07-30 DIAGNOSIS — F419 Anxiety disorder, unspecified: Secondary | ICD-10-CM | POA: Diagnosis not present

## 2020-07-30 DIAGNOSIS — R002 Palpitations: Secondary | ICD-10-CM | POA: Diagnosis not present

## 2020-07-30 DIAGNOSIS — C9200 Acute myeloblastic leukemia, not having achieved remission: Secondary | ICD-10-CM

## 2020-07-30 DIAGNOSIS — D649 Anemia, unspecified: Secondary | ICD-10-CM

## 2020-07-30 DIAGNOSIS — Z85850 Personal history of malignant neoplasm of thyroid: Secondary | ICD-10-CM

## 2020-07-30 DIAGNOSIS — K611 Rectal abscess: Secondary | ICD-10-CM | POA: Diagnosis not present

## 2020-07-30 DIAGNOSIS — D72819 Decreased white blood cell count, unspecified: Secondary | ICD-10-CM

## 2020-07-30 DIAGNOSIS — K219 Gastro-esophageal reflux disease without esophagitis: Secondary | ICD-10-CM

## 2020-07-30 DIAGNOSIS — F320 Major depressive disorder, single episode, mild: Secondary | ICD-10-CM

## 2020-07-30 DIAGNOSIS — E7800 Pure hypercholesterolemia, unspecified: Secondary | ICD-10-CM

## 2020-07-30 DIAGNOSIS — F439 Reaction to severe stress, unspecified: Secondary | ICD-10-CM

## 2020-07-30 DIAGNOSIS — Z853 Personal history of malignant neoplasm of breast: Secondary | ICD-10-CM

## 2020-07-30 DIAGNOSIS — F32A Depression, unspecified: Secondary | ICD-10-CM

## 2020-07-30 DIAGNOSIS — R195 Other fecal abnormalities: Secondary | ICD-10-CM

## 2020-07-30 DIAGNOSIS — E039 Hypothyroidism, unspecified: Secondary | ICD-10-CM | POA: Diagnosis not present

## 2020-07-30 DIAGNOSIS — Z5111 Encounter for antineoplastic chemotherapy: Secondary | ICD-10-CM | POA: Diagnosis not present

## 2020-07-30 MED ORDER — AZACITIDINE CHEMO SQ INJECTION
50.0000 mg/m2 | Freq: Once | INTRAMUSCULAR | Status: AC
  Administered 2020-07-30: 87.5 mg via SUBCUTANEOUS
  Filled 2020-07-30: qty 3.5

## 2020-07-30 MED ORDER — ONDANSETRON HCL 4 MG PO TABS
8.0000 mg | ORAL_TABLET | Freq: Once | ORAL | Status: AC
  Administered 2020-07-30: 8 mg via ORAL
  Filled 2020-07-30: qty 2

## 2020-07-30 NOTE — Progress Notes (Signed)
Patient ID: Debra Hale, female   DOB: [birth year], 70 y.o.   MRN: [id number]   Subjective:    Patient ID: Debra Hale, female    DOB: Oct 05, 1950, 70 y.o.   MRN: [id number]  HPI This visit occurred during the SARS-CoV-2 public health emergency.  Safety protocols were in place, including screening questions prior to the visit, additional usage of staff PPE, and extensive cleaning of exam room while observing appropriate contact time as indicated for disinfecting solutions.  Patient here for a scheduled follow up. Being followed by hematology for AML.  Confirmed by bone marrow biopsy.  Completed cycle 1 of induction therapy at [facility] with Vidaza and venetoclax.  Received 5 days of vidaza for cycle 2 recently which was interrupted secondary to signiifcant diarrhea and a perirectal abscess.  Repeat bone marrow biopsy did not reveal any residual disease - felt to be a good partial remission.  Perirectal abscess - resolved.  Taking imodium to help with diarrhea.  Increased palpitations.  No chest pain.  Breathing stable.  Eating.  No vomiting.     Past Medical History:  Diagnosis Date  . Anxiety   . Arthritis    Osteoarthritis  . Breast cancer ([place]) 2009   right breast lumpectomy with rad tx  . Colon cancer ([place])    surgery with chemo and rad tx  . Complication of anesthesia   . GERD (gastroesophageal reflux disease)   . Heart palpitations   . History of hiatal hernia   . History of kidney stones   . Hypothyroidism   . Liver disease   . Liver nodule    s/p negative biopsy  . Malignant neoplasm of thyroid gland ([place]) 2002   s/p surgery and XRT  . Osteoporosis   . Other and unspecified hyperlipidemia   . Palpitations   . Personal history of chemotherapy   . Personal history of malignant neoplasm of large intestine    carcinoma - cecum, s/p right laparoscopic colectomy - s/p chemotherapy and XRT  . Personal history of radiation therapy   . Pneumonia 2019  . PONV (postoperative nausea  and vomiting)   . Pure hypercholesterolemia   . Unspecified hereditary and idiopathic peripheral neuropathy    Past Surgical History:  Procedure Laterality Date  . APPENDECTOMY  1985  . BREAST BIOPSY Right 2009   positive  . BREAST BIOPSY Right 2009   negative  . BREAST LUMPECTOMY Right 2009   breast cancer  . CHOLECYSTECTOMY  1995  . COLONOSCOPY    . COLONOSCOPY WITH PROPOFOL N/A 10/29/2017   Procedure: COLONOSCOPY WITH PROPOFOL;  Surgeon: Manya Silvas, MD;  Location: [facility] ENDOSCOPY;  Service: Endoscopy;  Laterality: N/A;  . DILATION AND CURETTAGE OF UTERUS  1990  . DILATION AND CURETTAGE, DIAGNOSTIC / THERAPEUTIC  1990  . ESOPHAGOGASTRODUODENOSCOPY    . ESOPHAGOGASTRODUODENOSCOPY (EGD) WITH PROPOFOL N/A 10/29/2017   Procedure: ESOPHAGOGASTRODUODENOSCOPY (EGD) WITH PROPOFOL;  Surgeon: Manya Silvas, MD;  Location: [facility] ENDOSCOPY;  Service: Endoscopy;  Laterality: N/A;  . INCISION AND DRAINAGE PERIRECTAL ABSCESS N/A 06/17/2020   Procedure: IRRIGATION AND DEBRIDEMENT PERIRECTAL ABSCESS;  Surgeon: Jules Husbands, MD;  Location: [facility] ORS;  Service: General;  Laterality: N/A;  . LAPAROSCOPIC PARTIAL COLECTOMY     stage 3-C carcinoma of the cecum, s/p chemotherapy and xrt  . LITHOTRIPSY    . SIGMOIDOSCOPY  08/26/1993  . THYROID LOBECTOMY  2002   s/p XRT  . TOTAL HIP ARTHROPLASTY Right 10/24/2019   Procedure: TOTAL  HIP ARTHROPLASTY;  Surgeon: Corky Mull, MD;  Location: [facility] ORS;  Service: Orthopedics;  Laterality: Right;   Family History  Problem Relation Age of Onset  . Stroke Mother        72s  . Alzheimer's disease Mother   . Cancer Mother   . Lung cancer Father   . Prostate cancer Father   . Cancer Father        Colon  . Colon cancer Father   . Breast cancer Sister        76's  . Lung cancer Sister   . Breast cancer Maternal Aunt    Social History   Socioeconomic History  . Marital status: Single    Spouse name: Not on file  . Number of children: 0  .  Years of education: Not on file  . Highest education level: Not on file  Occupational History  . Not on file  Tobacco Use  . Smoking status: Never Smoker  . Smokeless tobacco: Never Used  Vaping Use  . Vaping Use: Never used  Substance and Sexual Activity  . Alcohol use: No    Alcohol/week: 0.0 standard drinks  . Drug use: No  . Sexual activity: Not Currently  Other Topics Concern  . Not on file  Social History Narrative  . Not on file   Social Determinants of Health   Financial Resource Strain: Not on file  Food Insecurity: Not on file  Transportation Needs: Not on file  Physical Activity: Not on file  Stress: Not on file  Social Connections: Not on file    Outpatient Encounter Medications as of 07/30/2020  Medication Sig  . clonazePAM (KLONOPIN) 0.5 MG tablet Take 1.5 mg by mouth 2 (two) times daily.   . diphenoxylate-atropine (LOMOTIL) 2.5-0.025 MG tablet Take 1 tablet by mouth 4 (four) times daily as needed for diarrhea or loose stools.  Marland Kitchen escitalopram (LEXAPRO) 20 MG tablet Take 20 mg by mouth every morning.   Marland Kitchen levothyroxine (SYNTHROID) 125 MCG tablet TAKE 1 TABLET BY MOUTH EVERY DAY  . magnesium oxide (MAG-OX) 400 MG tablet Take 400 mg by mouth daily.  . Misc. Devices (SITZ BATH) MISC 10 Packages by Does not apply route in the morning, at noon, in the evening, and at bedtime.  Marland Kitchen oxyCODONE-acetaminophen (PERCOCET) 7.5-325 MG tablet Take 2 tablets by mouth every 6 (six) hours as needed for severe pain. (Patient not taking: Reported on 07/31/2020)  . pantoprazole (PROTONIX) 40 MG tablet TAKE 1 TABLET BY MOUTH EVERY DAY  . potassium chloride SA (KLOR-CON) 20 MEQ tablet Take 1 tablet (20 mEq total) by mouth 2 (two) times daily.  . valACYclovir (VALTREX) 500 MG tablet Take 500 mg by mouth daily.  . [DISCONTINUED] venetoclax (VENCLEXTA) 100 MG tablet Take 100 mg by mouth daily. Tablets should be swallowed whole with a meal and a full glass of water.   Facility-Administered  Encounter Medications as of 07/30/2020  Medication  . [COMPLETED] azaCITIDine (VIDAZA) chemo injection 87.5 mg  . ondansetron (ZOFRAN) tablet 8 mg  . potassium chloride (KLOR-CON) CR tablet 40 mEq    Review of Systems  Constitutional: Negative for appetite change and unexpected weight change.  HENT: Negative for congestion and sinus pressure.   Respiratory: Negative for cough, chest tightness and shortness of breath.   Cardiovascular: Negative for chest pain, palpitations and leg swelling.  Gastrointestinal: Negative for abdominal pain, diarrhea, nausea and vomiting.  Genitourinary: Negative for difficulty urinating and dysuria.  Musculoskeletal: Negative for  joint swelling and myalgias.  Skin: Negative for color change and rash.  Neurological: Negative for dizziness, light-headedness and headaches.  Psychiatric/Behavioral: Negative for agitation and dysphoric mood.       Objective:    Physical Exam Vitals reviewed.  Constitutional:      General: She is not in acute distress.    Appearance: Normal appearance.  HENT:     Head: Normocephalic and atraumatic.     Right Ear: External ear normal.  Eyes:     General:        Right eye: No discharge.        Left eye: No discharge.     Conjunctiva/sclera: Conjunctivae normal.  Neck:     Thyroid: No thyromegaly.  Cardiovascular:     Rate and Rhythm: Normal rate and regular rhythm.  Pulmonary:     Effort: No respiratory distress.     Breath sounds: Normal breath sounds. No wheezing.  Abdominal:     General: Bowel sounds are normal.     Palpations: Abdomen is soft.     Tenderness: There is no abdominal tenderness.  Musculoskeletal:        General: No swelling or tenderness.     Cervical back: Neck supple. No tenderness.  Lymphadenopathy:     Cervical: No cervical adenopathy.  Skin:    Findings: No erythema or rash.  Neurological:     Mental Status: She is alert.  Psychiatric:        Mood and Affect: Mood normal.         Behavior: Behavior normal.     BP 104/62   Pulse 87   Temp 97.8 F (36.6 C)   Ht 5' 4"  (1.626 m)   Wt 128 lb (58.1 kg)   SpO2 98%   BMI 21.97 kg/m  Wt Readings from Last 3 Encounters:  07/31/20 127 lb (57.6 kg)  07/30/20 128 lb (58.1 kg)  07/29/20 127 lb 8 oz (57.8 kg)     Lab Results  Component Value Date   WBC 1.7 (L) 07/29/2020   HGB 12.8 07/29/2020   HCT 38.0 07/29/2020   PLT 102 (L) 07/29/2020   GLUCOSE 102 (H) 07/29/2020   CHOL 163 02/20/2020   TRIG 110.0 02/20/2020   HDL 40.70 02/20/2020   LDLDIRECT 126.0 07/11/2015   LDLCALC 100 (H) 02/20/2020   ALT 24 07/29/2020   AST 16 07/29/2020   NA 139 07/29/2020   K 3.6 07/29/2020   CL 103 07/29/2020   CREATININE 0.79 07/29/2020   BUN 18 07/29/2020   CO2 26 07/29/2020   TSH 0.093 (L) 06/16/2020   HGBA1C 5.3 12/10/2016    CT BONE MARROW BIOPSY & ASPIRATION  Result Date: 07/16/2020 CLINICAL DATA:  History of leukemia, sustained neutropenia EXAM: CT GUIDED DEEP ILIAC BONE ASPIRATION AND CORE BIOPSY TECHNIQUE: Patient was placed prone on the CT gantry and limited axial scans through the pelvis were obtained. Appropriate skin entry site was identified. Skin site was marked, prepped with chlorhexidine, draped in usual sterile fashion, and infiltrated locally with 1% lidocaine. Intravenous versed 64m were administered as anxiolysis during continuous monitoring of the patient's level of consciousness and physiological / cardiorespiratory status by the radiology RN. Under CT fluoroscopic guidance an 11-gauge Cook trocar bone needle was advanced into the right iliac bone just lateral to the sacroiliac joint. Once needle tip position was confirmed, core and aspiration samples were obtained, submitted to pathology for approval. Post procedure scans show no hematoma or fracture. Patient tolerated  procedure well. COMPLICATIONS: COMPLICATIONS none IMPRESSION: 1. Technically successful CT guided right iliac bone core and aspiration  biopsy. Electronically Signed   By: Lucrezia Europe M.D.   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        Anemia    Hematology following hgb.       Anxiety    Continue lexapro.  Being followed by Dr Clovis Riley.       GERD (gastroesophageal reflux disease)    No upper symptoms reported. On protonix.       History of breast cancer    Mammogram 03/07/20 - birads I.       History of thyroid cancer    Follow tsh.       Hypercholesterolemia    Follow lipid panel.       Hypothyroidism    On thyroid replacement.  Follow tsh.       Leukopenia    Being followed by hematology.       Loose stools    Taking imodium.  Helps.  Follow.       Mild depression ([place])    Continue lexapro.  Continue f/u with Dr Octavia Heir.       Palpitations - Primary    EKG - SR with no acute ischemic changes. Has seen cardiology previously. Has noticed increased palpitations recently.  Refer back to cardiology for question of need for further w/up, for example holder, etc.  Follow.       Relevant Orders   EKG 12-Lead (Completed)   Ambulatory referral to Cardiology   Perirectal abscess    Resolved.       Stress    Increased stress with her current medical issues.  Discussed counselors.  Follow.  Notify me if feels needs any further intervention.            [name], MD

## 2020-07-31 ENCOUNTER — Ambulatory Visit: Payer: PPO

## 2020-07-31 ENCOUNTER — Inpatient Hospital Stay (HOSPITAL_BASED_OUTPATIENT_CLINIC_OR_DEPARTMENT_OTHER): Payer: PPO | Admitting: Oncology

## 2020-07-31 ENCOUNTER — Inpatient Hospital Stay: Payer: PPO

## 2020-07-31 ENCOUNTER — Telehealth: Payer: Self-pay | Admitting: Oncology

## 2020-07-31 ENCOUNTER — Encounter: Payer: Self-pay | Admitting: Oncology

## 2020-07-31 VITALS — BP 107/66 | HR 79 | Temp 97.2°F | Resp 20 | Wt 127.0 lb

## 2020-07-31 DIAGNOSIS — C9200 Acute myeloblastic leukemia, not having achieved remission: Secondary | ICD-10-CM

## 2020-07-31 DIAGNOSIS — Z5111 Encounter for antineoplastic chemotherapy: Secondary | ICD-10-CM | POA: Diagnosis not present

## 2020-07-31 LAB — SURGICAL PATHOLOGY

## 2020-07-31 MED ORDER — AZACITIDINE CHEMO SQ INJECTION
50.0000 mg/m2 | Freq: Once | INTRAMUSCULAR | Status: AC
  Administered 2020-07-31: 87.5 mg via SUBCUTANEOUS
  Filled 2020-07-31: qty 3.5

## 2020-07-31 MED ORDER — ONDANSETRON HCL 4 MG PO TABS
8.0000 mg | ORAL_TABLET | Freq: Once | ORAL | Status: AC
  Administered 2020-07-31: 8 mg via ORAL
  Filled 2020-07-31: qty 2

## 2020-07-31 NOTE — Progress Notes (Signed)
Patient here today for follow up regarding AML, patient reports she feels like she is sleeping all the time.

## 2020-07-31 NOTE — Telephone Encounter (Signed)
Patient came into [facility] - asking about dyeing hair - highlights   Wants to know when and if can do   Requesting calll from nurse when can... thanks!

## 2020-08-01 ENCOUNTER — Inpatient Hospital Stay: Payer: PPO

## 2020-08-01 VITALS — BP 120/64 | HR 68 | Temp 97.6°F | Resp 18

## 2020-08-01 DIAGNOSIS — Z5111 Encounter for antineoplastic chemotherapy: Secondary | ICD-10-CM | POA: Diagnosis not present

## 2020-08-01 DIAGNOSIS — C9200 Acute myeloblastic leukemia, not having achieved remission: Secondary | ICD-10-CM

## 2020-08-01 MED ORDER — AZACITIDINE CHEMO SQ INJECTION
50.0000 mg/m2 | Freq: Once | INTRAMUSCULAR | Status: AC
  Administered 2020-08-01: 87.5 mg via SUBCUTANEOUS
  Filled 2020-08-01: qty 3.5

## 2020-08-01 MED ORDER — ONDANSETRON HCL 4 MG PO TABS
8.0000 mg | ORAL_TABLET | Freq: Once | ORAL | Status: AC
  Administered 2020-08-01: 8 mg via ORAL
  Filled 2020-08-01: qty 2

## 2020-08-02 ENCOUNTER — Inpatient Hospital Stay: Payer: PPO | Attending: Oncology

## 2020-08-02 ENCOUNTER — Other Ambulatory Visit: Payer: Self-pay

## 2020-08-02 VITALS — BP 120/62 | HR 84 | Temp 96.8°F | Resp 20

## 2020-08-02 DIAGNOSIS — Z853 Personal history of malignant neoplasm of breast: Secondary | ICD-10-CM | POA: Insufficient documentation

## 2020-08-02 DIAGNOSIS — Z85850 Personal history of malignant neoplasm of thyroid: Secondary | ICD-10-CM | POA: Insufficient documentation

## 2020-08-02 DIAGNOSIS — Z79899 Other long term (current) drug therapy: Secondary | ICD-10-CM | POA: Insufficient documentation

## 2020-08-02 DIAGNOSIS — R634 Abnormal weight loss: Secondary | ICD-10-CM | POA: Insufficient documentation

## 2020-08-02 DIAGNOSIS — Z85038 Personal history of other malignant neoplasm of large intestine: Secondary | ICD-10-CM | POA: Diagnosis not present

## 2020-08-02 DIAGNOSIS — C9200 Acute myeloblastic leukemia, not having achieved remission: Secondary | ICD-10-CM | POA: Diagnosis not present

## 2020-08-02 DIAGNOSIS — Z5111 Encounter for antineoplastic chemotherapy: Secondary | ICD-10-CM | POA: Insufficient documentation

## 2020-08-02 DIAGNOSIS — R630 Anorexia: Secondary | ICD-10-CM | POA: Insufficient documentation

## 2020-08-02 DIAGNOSIS — E039 Hypothyroidism, unspecified: Secondary | ICD-10-CM | POA: Diagnosis not present

## 2020-08-02 MED ORDER — AZACITIDINE CHEMO SQ INJECTION
50.0000 mg/m2 | Freq: Once | INTRAMUSCULAR | Status: AC
  Administered 2020-08-02: 87.5 mg via SUBCUTANEOUS
  Filled 2020-08-02: qty 3.5

## 2020-08-02 MED ORDER — ONDANSETRON HCL 4 MG PO TABS
8.0000 mg | ORAL_TABLET | Freq: Once | ORAL | Status: AC
  Administered 2020-08-02: 8 mg via ORAL
  Filled 2020-08-02: qty 2

## 2020-08-02 NOTE — Unmapped (Signed)
ADDENDUM 08/19/20: currently off posa and ven on hold until ANC stabilizes.  Next fu appt 09/05/20.    ADDENDUM 08/09/20: Contacted patient and LVM    [name] is uncertain if she is taking Venclexta.  [facility] is reaching out to clinic to confirm and will call patient back 08/07/20      Horace Porteous, PharmD  [facility]

## 2020-08-02 NOTE — Progress Notes (Unsigned)
Symptom Management Clinic [facility]  [phone] [phone]  Patient Care Team: [facility], MD as PCP - General (Internal Medicine) Iran Ouch, MD as PCP - Cardiology (Cardiology)   Name of the patient: Debra Hale  [id number]  02-12-1951   Date of visit: 06/28/20  Diagnosis- AML  Chief complaint/ Reason for visit- Medication reconciliation, nausea, diarrhea  Heme/Onc history:  Oncology History  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 Initial Diagnosis   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HCC)   06/10/2020 -  Chemotherapy    Patient is on Treatment Plan: AML DOSE REDUCED AZACITIDINE SQ D1-5 Q28D        Interval history-    Denies any neurologic complaints. Denies recent fevers or illnesses. Denies any easy bleeding or bruising. Reports good appetite and denies weight loss. Denies chest pain. Denies any nausea, vomiting, constipation, or diarrhea. Denies urinary complaints. Patient offers no further specific complaints today.  ECOG FS:{CHL ONC [id number]}  Review of systems- ROS   Current treatment- ***  Allergies  Allergen Reactions  . Demeclocycline Other (See Comments)    Throat swells  . Sulfa Antibiotics     Other reaction(s): Other (See Comments) Throat swells  . Tetracyclines & Related Other (See Comments)    Throat swells   . Augmentin [Amoxicillin-Pot Clavulanate] Diarrhea  . Bentyl [Dicyclomine Hcl]     unkn  . Ciprofloxacin Diarrhea  . Codeine Other (See Comments)    dizziness    . Epinephrine     Speeds heart up - panic   . Flagyl [Metronidazole] Nausea And Vomiting  . Librax [Chlordiazepoxide-Clidinium]     unkn  . Novocain [Procaine] Other (See Comments)    "Shaky"  . Phenobarbital     unkn  . Prednisone     Nervous    . Ultram [Tramadol] Other (See Comments)    Sick feeling    Past Medical History:  Diagnosis Date  . Anxiety   . Arthritis    Osteoarthritis   . Breast cancer (HCC) 2009   right breast lumpectomy with rad tx  . Colon cancer (HCC)    surgery with chemo and rad tx  . Complication of anesthesia   . GERD (gastroesophageal reflux disease)   . Heart palpitations   . History of hiatal hernia   . History of kidney stones   . Hypothyroidism   . Liver disease   . Liver nodule    s/p negative biopsy  . Malignant neoplasm of thyroid gland (HCC) 2002   s/p surgery and XRT  . Osteoporosis   . Other and unspecified hyperlipidemia   . Palpitations   . Personal history of chemotherapy   . Personal history of malignant neoplasm of large intestine    carcinoma - cecum, s/p right laparoscopic colectomy - s/p chemotherapy and XRT  . Personal history of radiation therapy   . Pneumonia 2019  . PONV (postoperative nausea and vomiting)   . Pure hypercholesterolemia   . Unspecified hereditary and idiopathic peripheral neuropathy     Past Surgical History:  Procedure Laterality Date  . APPENDECTOMY  1985  . BREAST BIOPSY Right 2009   positive  . BREAST BIOPSY Right 2009   negative  . BREAST LUMPECTOMY Right 2009   breast cancer  . CHOLECYSTECTOMY  1995  . COLONOSCOPY    . COLONOSCOPY WITH PROPOFOL N/A 10/29/2017   Procedure: COLONOSCOPY WITH PROPOFOL;  Surgeon: Scot Jun, MD;  Location: [facility] ENDOSCOPY;  Service:  Endoscopy;  Laterality: N/A;  . DILATION AND CURETTAGE OF UTERUS  1990  . DILATION AND CURETTAGE, DIAGNOSTIC / THERAPEUTIC  1990  . ESOPHAGOGASTRODUODENOSCOPY    . ESOPHAGOGASTRODUODENOSCOPY (EGD) WITH PROPOFOL N/A 10/29/2017   Procedure: ESOPHAGOGASTRODUODENOSCOPY (EGD) WITH PROPOFOL;  Surgeon: Scot Jun, MD;  Location: [facility] ENDOSCOPY;  Service: Endoscopy;  Laterality: N/A;  . INCISION AND DRAINAGE PERIRECTAL ABSCESS N/A 06/17/2020   Procedure: IRRIGATION AND DEBRIDEMENT PERIRECTAL ABSCESS;  Surgeon: Leafy Ro, MD;  Location: [facility] ORS;  Service: General;  Laterality: N/A;  . LAPAROSCOPIC PARTIAL COLECTOMY      stage 3-C carcinoma of the cecum, s/p chemotherapy and xrt  . LITHOTRIPSY    . SIGMOIDOSCOPY  08/26/1993  . THYROID LOBECTOMY  2002   s/p XRT  . TOTAL HIP ARTHROPLASTY Right 10/24/2019   Procedure: TOTAL HIP ARTHROPLASTY;  Surgeon: Christena Flake, MD;  Location: [facility] ORS;  Service: Orthopedics;  Laterality: Right;    Social History   Socioeconomic History  . Marital status: Single    Spouse name: Not on file  . Number of children: 0  . Years of education: Not on file  . Highest education level: Not on file  Occupational History  . Not on file  Tobacco Use  . Smoking status: Never Smoker  . Smokeless tobacco: Never Used  Vaping Use  . Vaping Use: Never used  Substance and Sexual Activity  . Alcohol use: No    Alcohol/week: 0.0 standard drinks  . Drug use: No  . Sexual activity: Not Currently  Other Topics Concern  . Not on file  Social History Narrative  . Not on file   Social Determinants of Health   Financial Resource Strain: Not on file  Food Insecurity: Not on file  Transportation Needs: Not on file  Physical Activity: Not on file  Stress: Not on file  Social Connections: Not on file  Intimate Partner Violence: Not on file    Family History  Problem Relation Age of Onset  . Stroke Mother        16s  . Alzheimer's disease Mother   . Cancer Mother   . Lung cancer Father   . Prostate cancer Father   . Cancer Father        Colon  . Colon cancer Father   . Breast cancer Sister        82's  . Lung cancer Sister   . Breast cancer Maternal Aunt      Current Outpatient Medications:  .  clonazePAM (KLONOPIN) 0.5 MG tablet, Take 1.5 mg by mouth 2 (two) times daily. , Disp: , Rfl: 0 .  diphenoxylate-atropine (LOMOTIL) 2.5-0.025 MG tablet, Take 1 tablet by mouth 4 (four) times daily as needed for diarrhea or loose stools., Disp: 120 tablet, Rfl: 0 .  escitalopram (LEXAPRO) 20 MG tablet, Take 20 mg by mouth every morning. , Disp: , Rfl:  .  levothyroxine  (SYNTHROID) 125 MCG tablet, TAKE 1 TABLET BY MOUTH EVERY DAY, Disp: 90 tablet, Rfl: 1 .  magnesium oxide (MAG-OX) 400 MG tablet, Take 400 mg by mouth daily., Disp: , Rfl:  .  Misc. Devices (SITZ BATH) MISC, 10 Packages by Does not apply route in the morning, at noon, in the evening, and at bedtime., Disp: 10 each, Rfl: 1 .  oxyCODONE-acetaminophen (PERCOCET) 7.5-325 MG tablet, Take 2 tablets by mouth every 6 (six) hours as needed for severe pain. (Patient not taking: Reported on 07/31/2020), Disp: 20 tablet, Rfl: 0 .  pantoprazole (PROTONIX) 40 MG tablet, TAKE 1 TABLET BY MOUTH EVERY DAY, Disp: 90 tablet, Rfl: 2 .  potassium chloride SA (KLOR-CON) 20 MEQ tablet, Take 1 tablet (20 mEq total) by mouth 2 (two) times daily., Disp: 21 tablet, Rfl: 0 .  valACYclovir (VALTREX) 500 MG tablet, Take 500 mg by mouth daily., Disp: , Rfl:   Current Facility-Administered Medications:  .  ondansetron (ZOFRAN) tablet 8 mg, 8 mg, Oral, Once, Guse, Moody Robben M, FNP  Facility-Administered Medications Ordered in Other Visits:  .  potassium chloride (KLOR-CON) CR tablet 40 mEq, 40 mEq, Oral, BID, Orlie Dakin, Tollie Pizza, MD, 40 mEq at 07/08/20 1136  Physical exam: There were no vitals filed for this visit. Physical Exam   CMP Latest Ref Rng & Units 07/29/2020  Glucose 70 - 99 mg/dL 782(N)  BUN 8 - 23 mg/dL 18  Creatinine 5.62 - 1.30 mg/dL 8.65  Sodium 784 - 696 mmol/L 139  Potassium 3.5 - 5.1 mmol/L 3.6  Chloride 98 - 111 mmol/L 103  CO2 22 - 32 mmol/L 26  Calcium 8.9 - 10.3 mg/dL 2.9(B)  Total Protein 6.5 - 8.1 g/dL 6.4(L)  Total Bilirubin 0.3 - 1.2 mg/dL 1.0  Alkaline Phos 38 - 126 U/L 99  AST 15 - 41 U/L 16  ALT 0 - 44 U/L 24   CBC Latest Ref Rng & Units 07/29/2020  WBC 4.0 - 10.5 K/uL 1.7(L)  Hemoglobin 12.0 - 15.0 g/dL 28.4  Hematocrit 13.2 - 46.0 % 38.0  Platelets 150 - 400 K/uL 102(L)    No images are attached to the encounter.  CT BONE MARROW BIOPSY & ASPIRATION  Result Date: 07/16/2020 CLINICAL  DATA:  History of leukemia, sustained neutropenia EXAM: CT GUIDED DEEP ILIAC BONE ASPIRATION AND CORE BIOPSY TECHNIQUE: Patient was placed prone on the CT gantry and limited axial scans through the pelvis were obtained. Appropriate skin entry site was identified. Skin site was marked, prepped with chlorhexidine, draped in usual sterile fashion, and infiltrated locally with 1% lidocaine. Intravenous versed 1mg  were administered as anxiolysis during continuous monitoring of the patient's level of consciousness and physiological / cardiorespiratory status by the radiology RN. Under CT fluoroscopic guidance an 11-gauge Cook trocar bone needle was advanced into the right iliac bone just lateral to the sacroiliac joint. Once needle tip position was confirmed, core and aspiration samples were obtained, submitted to pathology for approval. Post procedure scans show no hematoma or fracture. Patient tolerated procedure well. COMPLICATIONS: COMPLICATIONS none IMPRESSION: 1. Technically successful CT guided right iliac bone core and aspiration biopsy. Electronically Signed   By: Corlis Leak M.D.   On: 07/16/2020 13:11    Assessment and plan- Patient is a 70 y.o. female ***   Visit Diagnosis No diagnosis found.  Patient expressed understanding and was in agreement with this plan. She also understands that She can call clinic at any time with any questions, concerns, or complaints.   Thank you for allowing me to participate in the care of this very pleasant patient.   Consuello Masse, DNP, AGNP-[facility] [phone]  CC:

## 2020-08-04 ENCOUNTER — Encounter: Payer: Self-pay | Admitting: Internal Medicine

## 2020-08-04 NOTE — Assessment & Plan Note (Signed)
EKG - SR with no acute ischemic changes. Has seen cardiology previously. Has noticed increased palpitations recently.  Refer back to cardiology for question of need for further w/up, for example holder, etc.  Follow.

## 2020-08-04 NOTE — Assessment & Plan Note (Signed)
Continue lexapro.  Continue f/u with Dr Octavia Heir.

## 2020-08-04 NOTE — Assessment & Plan Note (Signed)
Mammogram 03/07/20 - birads I.

## 2020-08-04 NOTE — Assessment & Plan Note (Signed)
Increased stress with her current medical issues.  Discussed counselors.  Follow.  Notify me if feels needs any further intervention.

## 2020-08-04 NOTE — Assessment & Plan Note (Signed)
Follow tsh.  

## 2020-08-04 NOTE — Assessment & Plan Note (Signed)
Being followed and treated by hematology as outlined.

## 2020-08-04 NOTE — Assessment & Plan Note (Signed)
Resolved

## 2020-08-04 NOTE — Assessment & Plan Note (Signed)
No upper symptoms reported.  On protonix.   

## 2020-08-04 NOTE — Assessment & Plan Note (Signed)
On thyroid replacement.  Follow tsh.  

## 2020-08-04 NOTE — Assessment & Plan Note (Signed)
Being followed by hematology.   

## 2020-08-04 NOTE — Assessment & Plan Note (Signed)
Continue lexapro.  Being followed by Dr Clovis Riley.

## 2020-08-04 NOTE — Assessment & Plan Note (Signed)
Taking imodium.  Helps.  Follow.  

## 2020-08-04 NOTE — Assessment & Plan Note (Signed)
Follow lipid panel.   

## 2020-08-04 NOTE — Assessment & Plan Note (Signed)
Hematology following hgb.

## 2020-08-05 ENCOUNTER — Inpatient Hospital Stay (HOSPITAL_BASED_OUTPATIENT_CLINIC_OR_DEPARTMENT_OTHER): Payer: PPO | Admitting: Hospice and Palliative Medicine

## 2020-08-05 ENCOUNTER — Encounter: Payer: PPO | Admitting: Surgery

## 2020-08-05 DIAGNOSIS — C9200 Acute myeloblastic leukemia, not having achieved remission: Secondary | ICD-10-CM | POA: Diagnosis not present

## 2020-08-05 DIAGNOSIS — Z515 Encounter for palliative care: Secondary | ICD-10-CM

## 2020-08-05 NOTE — Progress Notes (Signed)
Virtual Visit via Telephone Note  I connected with Debra Hale on 08/05/20 at 10:45 AM EDT by telephone and verified that I am speaking with the correct person using two identifiers.  Location: Patient: home Provider: clinic   I discussed the limitations, risks, security and privacy concerns of performing an evaluation and management service by telephone and the availability of in person appointments. I also discussed with the patient that there may be a patient responsible charge related to this service. The patient expressed understanding and agreed to proceed.   History of Present Illness: Debra Hale is a 70 y.o. 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  Patient was previously on venetoclax but developed perirectal abscess and so this was discontinued.  She has been symptomatic with progressive fatigue and poor oral intake. Palliative care was requested to help coordinate care and manage ongoing symptoms.    Observations/Objective: I called and spoke with patient by phone.  She reports she is doing reasonably well.  Overall, she appears to be symptomatically improved with the less fatigue.  She reports poor oral intake but weights have been relatively stable.  No nausea, vomiting, or diarrhea currently.  No pain or other distressing symptoms.  Patient reports persistent anxiety.  She is currently on Klonopin twice daily and max dose of Lexapro daily.  She says that she has an appointment in the near future with her psychiatrist in [place].  However, she could not tell me his name.  I suggested that she speak with him regarding her persistent anxiety.  Assessment and Plan: AML -on current treatment with Vidaza.  Followed by Dr. [name].  Symptomatically, appears improved.  Appreciate home-based palliative care support.  Follow Up Instructions: Follow-up MyChart visit in 1 to 2 months   I discussed the assessment and treatment plan  with the patient. The patient was provided an opportunity to ask questions and all were answered. The patient agreed with the plan and demonstrated an understanding of the instructions.   The patient was advised to call back or seek an in-person evaluation if the symptoms worsen or if the condition fails to improve as anticipated.  I provided 10 minutes of non-face-to-face time during this encounter.   Irean Hong, NP

## 2020-08-06 ENCOUNTER — Inpatient Hospital Stay: Payer: PPO | Admitting: Pharmacist

## 2020-08-06 DIAGNOSIS — C9200 Acute myeloblastic leukemia, not having achieved remission: Secondary | ICD-10-CM

## 2020-08-06 NOTE — Progress Notes (Signed)
[facility]  [phone] [phone]  Patient Care Team: [name], MD as PCP - General (Internal Medicine) Wellington Hampshire, MD as PCP - Cardiology (Cardiology)   Name of the patient: Debra Hale  [id number]  [birth year]   Date of visit: 08/06/20  HPI: Patient is a 70 y.o. female with AML. Currently treatment with azacitidine, venetoclax on hold.  Reason for Consult: Medication adherence visit. Patient is not currently on oral chemotherapy, today's appt is to assist with maintence medication adherence  MEDICATIONS:  Current Outpatient Medications  Medication Sig Dispense Refill  . clonazePAM (KLONOPIN) 0.5 MG tablet Take 1.5 mg by mouth 2 (two) times daily.   0  . diphenoxylate-atropine (LOMOTIL) 2.5-0.025 MG tablet Take 1 tablet by mouth 4 (four) times daily as needed for diarrhea or loose stools. 120 tablet 0  . escitalopram (LEXAPRO) 20 MG tablet Take 20 mg by mouth every morning.     Marland Kitchen levothyroxine (SYNTHROID) 125 MCG tablet TAKE 1 TABLET BY MOUTH EVERY DAY 90 tablet 1  . magnesium oxide (MAG-OX) 400 MG tablet Take 400 mg by mouth daily.    . Misc. Devices (SITZ BATH) MISC 10 Packages by Does not apply route in the morning, at noon, in the evening, and at bedtime. 10 each 1  . oxyCODONE-acetaminophen (PERCOCET) 7.5-325 MG tablet Take 2 tablets by mouth every 6 (six) hours as needed for severe pain. (Patient not taking: Reported on 07/31/2020) 20 tablet 0  . pantoprazole (PROTONIX) 40 MG tablet TAKE 1 TABLET BY MOUTH EVERY DAY 90 tablet 2  . potassium chloride SA (KLOR-CON) 20 MEQ tablet Take 1 tablet (20 mEq total) by mouth 2 (two) times daily. 21 tablet 0  . valACYclovir (VALTREX) 500 MG tablet Take 500 mg by mouth daily.     Current Facility-Administered Medications  Medication Dose Route Frequency Provider Last Rate Last Admin  . ondansetron (ZOFRAN) tablet 8 mg  8 mg Oral Once Guse, Jacquelynn Cree, FNP        Facility-Administered Medications Ordered in Other Visits  Medication Dose Route Frequency Provider Last Rate Last Admin  . potassium chloride (KLOR-CON) CR tablet 40 mEq  40 mEq Oral BID Lloyd Huger, MD   40 mEq at 07/08/20 1136    VITAL SIGNS: There were no vitals taken for this visit. There were no vitals filed for this visit.  Estimated body mass index is 21.8 kg/m as calculated from the following:   Height as of 07/30/20: 5\' 4"  (1.626 m).   Weight as of 07/31/20: 57.6 kg (127 lb).  LABS: CBC:    Component Value Date/Time   WBC 1.7 (L) 07/29/2020 1034   HGB 12.8 07/29/2020 1034   HGB 12.5 05/09/2018 1147   HCT 38.0 07/29/2020 1034   HCT 36.8 05/09/2018 1147   PLT 102 (L) 07/29/2020 1034   PLT 285 05/09/2018 1147   MCV 85.0 07/29/2020 1034   MCV 86 05/09/2018 1147   MCV 89 11/30/2013 1419   NEUTROABS 1.0 (L) 07/29/2020 1034   NEUTROABS 2.9 11/20/2013 1404   LYMPHSABS 0.4 (L) 07/29/2020 1034   LYMPHSABS 0.9 (L) 11/20/2013 1404   MONOABS 0.1 07/29/2020 1034   MONOABS 0.2 11/20/2013 1404   EOSABS 0.0 07/29/2020 1034   EOSABS 0.1 11/20/2013 1404   BASOSABS 0.0 07/29/2020 1034   BASOSABS 0.0 11/20/2013 1404   Comprehensive Metabolic Panel:    Component Value Date/Time   NA 139 07/29/2020 1034   NA  143 05/09/2018 1147   NA 145 11/30/2013 1419   K 3.6 07/29/2020 1034   K 3.5 11/30/2013 1419   CL 103 07/29/2020 1034   CL 109 (H) 11/30/2013 1419   CO2 26 07/29/2020 1034   CO2 29 11/30/2013 1419   BUN 18 07/29/2020 1034   BUN 11 05/09/2018 1147   BUN 15 11/30/2013 1419   CREATININE 0.79 07/29/2020 1034   CREATININE 1.04 11/30/2013 1419   GLUCOSE 102 (H) 07/29/2020 1034   GLUCOSE 97 11/30/2013 1419   CALCIUM 8.7 (L) 07/29/2020 1034   CALCIUM 8.7 11/30/2013 1419   AST 16 07/29/2020 1034   AST 28 11/30/2013 1419   ALT 24 07/29/2020 1034   ALT 71 (H) 11/30/2013 1419   ALKPHOS 99 07/29/2020 1034   ALKPHOS 114 11/30/2013 1419   BILITOT 1.0 07/29/2020 1034    BILITOT 0.7 11/30/2013 1419   PROT 6.4 (L) 07/29/2020 1034   PROT 7.1 11/30/2013 1419   ALBUMIN 3.7 07/29/2020 1034   ALBUMIN 3.3 (L) 11/30/2013 1419     Assessment and Plan-  During visit today patients medications were placed in bubble medication trays. Tray in separated into morning, noon, evening, and night sections for each day. Four trays were made to cover 4 weeks of medication for Debra Hale.  Included in the medication tray: escitalopram, clonazepam, valacyclovir, pantoprazole, levothyroxine, potassium chloride, and magnesium oxide.  The goal is to get her switched to [facility] so they can package her medications monthly. She recently filled 90 days supplies of some of her medication so until those can be refilled at [facility] with her prescription insurance she will have her trays filled by the office.  Recent 90 day fills:  -valacyclovir (can be filled again on 09/23/20) -pantoprazole (can be filled again on 09/11/20)  She needs to request refills of her escitalopram, clonazepam, potassium chloride, magnesium, and levothyroxine. Prior to her next appt of tray refill in 3 weeks. She knows to only get a 30 day supply of the listed medications.  All medication and bottles were returned to Debra Hale    Follow-up: 3 weeks on 08/26/20, will fill additional trays at that time   Patient expressed understanding and was in agreement with this plan. She also understands that She can call clinic at any time with any questions, concerns, or complaints.   Thank you for allowing me to participate in the care of this very pleasant patient.   Time Total: 45 mins  Visit consisted of counseling and education on dealing with issues of symptom management in the setting of serious and potentially life-threatening illness.Greater than 50%  of this time was spent counseling and coordinating care related to the above assessment and plan.  Signed by: Darl Pikes, PharmD, BCPS,  Salley Slaughter, CPP Hematology/Oncology Clinical Pharmacist Practitioner [facility] [phone]  08/06/2020 4:10 PM

## 2020-08-09 ENCOUNTER — Other Ambulatory Visit: Payer: Self-pay

## 2020-08-09 ENCOUNTER — Ambulatory Visit (INDEPENDENT_AMBULATORY_CARE_PROVIDER_SITE_OTHER): Payer: PPO | Admitting: Family

## 2020-08-09 ENCOUNTER — Ambulatory Visit: Payer: PPO

## 2020-08-09 ENCOUNTER — Encounter: Payer: Self-pay | Admitting: Family

## 2020-08-09 VITALS — BP 118/80 | HR 90 | Ht 64.0 in | Wt 125.0 lb

## 2020-08-09 DIAGNOSIS — E876 Hypokalemia: Secondary | ICD-10-CM

## 2020-08-09 DIAGNOSIS — R002 Palpitations: Secondary | ICD-10-CM

## 2020-08-09 DIAGNOSIS — I319 Disease of pericardium, unspecified: Secondary | ICD-10-CM

## 2020-08-09 MED ORDER — METOPROLOL TARTRATE 25 MG PO TABS: 25.0000 mg | ORAL_TABLET | ORAL | 1 refills | Status: DC | PRN

## 2020-08-09 NOTE — Progress Notes (Addendum)
Office Visit    Patient Name: [name] Date of Encounter: 08/09/2020  Primary Care Provider:  [name], MD Primary Cardiologist:  Kathlyn Sacramento, MD Electrophysiologist:  None   Chief Complaint    Debra Hale is a 70 y.o. female with a hx of AML, recurrent pericarditis, HLD, thyroid carcinoma s/p surgery and XRT, colon cancer s/p laparoscopic right colectomy, nephrolithiasis, anxiety, OP, breast cancer, palpitations in the setting of hypokalemia/hypomagnesia presents today for palpitations.   Past Medical History    Past Medical History:  Diagnosis Date  . Anxiety   . Arthritis    Osteoarthritis  . Breast cancer ([place]) 2009   right breast lumpectomy with rad tx  . Colon cancer ([place])    surgery with chemo and rad tx  . Complication of anesthesia   . GERD (gastroesophageal reflux disease)   . Heart palpitations   . History of hiatal hernia   . History of kidney stones   . Hypothyroidism   . Liver disease   . Liver nodule    s/p negative biopsy  . Malignant neoplasm of thyroid gland ([place]) 2002   s/p surgery and XRT  . Osteoporosis   . Other and unspecified hyperlipidemia   . Palpitations   . Personal history of chemotherapy   . Personal history of malignant neoplasm of large intestine    carcinoma - cecum, s/p right laparoscopic colectomy - s/p chemotherapy and XRT  . Personal history of radiation therapy   . Pneumonia 2019  . PONV (postoperative nausea and vomiting)   . Pure hypercholesterolemia   . Unspecified hereditary and idiopathic peripheral neuropathy    Past Surgical History:  Procedure Laterality Date  . APPENDECTOMY  1985  . BREAST BIOPSY Right 2009   positive  . BREAST BIOPSY Right 2009   negative  . BREAST LUMPECTOMY Right 2009   breast cancer  . CHOLECYSTECTOMY  1995  . COLONOSCOPY    . COLONOSCOPY WITH PROPOFOL N/A 10/29/2017   Procedure: COLONOSCOPY WITH PROPOFOL;  Surgeon: Manya Silvas, MD;  Location: [facility] ENDOSCOPY;   Service: Endoscopy;  Laterality: N/A;  . DILATION AND CURETTAGE OF UTERUS  1990  . DILATION AND CURETTAGE, DIAGNOSTIC / THERAPEUTIC  1990  . ESOPHAGOGASTRODUODENOSCOPY    . ESOPHAGOGASTRODUODENOSCOPY (EGD) WITH PROPOFOL N/A 10/29/2017   Procedure: ESOPHAGOGASTRODUODENOSCOPY (EGD) WITH PROPOFOL;  Surgeon: Manya Silvas, MD;  Location: [facility] ENDOSCOPY;  Service: Endoscopy;  Laterality: N/A;  . INCISION AND DRAINAGE PERIRECTAL ABSCESS N/A 06/17/2020   Procedure: IRRIGATION AND DEBRIDEMENT PERIRECTAL ABSCESS;  Surgeon: Jules Husbands, MD;  Location: [facility] ORS;  Service: General;  Laterality: N/A;  . LAPAROSCOPIC PARTIAL COLECTOMY     stage 3-C carcinoma of the cecum, s/p chemotherapy and xrt  . LITHOTRIPSY    . SIGMOIDOSCOPY  08/26/1993  . THYROID LOBECTOMY  2002   s/p XRT  . TOTAL HIP ARTHROPLASTY Right 10/24/2019   Procedure: TOTAL HIP ARTHROPLASTY;  Surgeon: Corky Mull, MD;  Location: [facility] ORS;  Service: Orthopedics;  Laterality: Right;   Allergies  Allergies  Allergen Reactions  . Demeclocycline Other (See Comments)    Throat swells  . Sulfa Antibiotics     Other reaction(s): Other (See Comments) Throat swells  . Tetracyclines & Related Other (See Comments)    Throat swells   . Augmentin [Amoxicillin-Pot Clavulanate] Diarrhea  . Bentyl [Dicyclomine Hcl]     unkn  . Ciprofloxacin Diarrhea  . Codeine Other (See Comments)    dizziness    .  Epinephrine     Speeds heart up - panic   . Flagyl [Metronidazole] Nausea And Vomiting  . Librax [Chlordiazepoxide-Clidinium]     unkn  . Novocain [Procaine] Other (See Comments)    "Shaky"  . Phenobarbital     unkn  . Prednisone     Nervous    . Ultram [Tramadol] Other (See Comments)    Sick feeling    History of Present Illness    DELMY Hale is a 70 y.o. female with a hx of AML, recurrent pericarditis, HLD, thyroid carcinoma s/p surgery and XRT, colon cancer s/p laparoscopic right colectomy, nephrolithiasis, anxiety, OP,  breast cancer, palpitations in the setting of hypokalemia/hypomagnesia. She was last seen 03/19/20 by Dr. Fletcher Anon.   Previous treadmill stress test 2017 with no evidence of ischemia with poor exercise tolerance and hypertensive respones to exercise. She had recurrent pericarditis and previous small pericardial effusion since 2017. Had suspected pericarditis in setting of URI in 2020. Sedimentation rate up to 70. Echo 05/2019 with LVEF 50-55% without pericardial effusion. Treated with colchicine and NSAIDS. Seen 09/2019 for preoperative clearance and recommended for lexiscan myoview which was low risk study and no evidence of ischemia. She was seen by Dr. Fletcher Anon 03/2020 noting minimal palpitations and workup was deferred.   Hospitalized 06/2020 due to perirectal abscess. Her ventoclax for AML was discontinued and transitioned to [place]. 07/29/20 lab work with K 3.6, normal liver enzymes, GFR >60, Hb 12.8. 06/16/20 TSH 0.093  She presents today for follow up. Tells me it feels as if her palpitations have been increasing over the last month. Tells me her stress has been about the same but does endorse that "everything" is stressful. She drinks water throughout the day - approximately 1.5 sixteen ounce bottles of water.  The palpitations are short-lived most often and happen daily. More noticeable when she lays on her left side.   Reports no shortness of breath and stable dyspnea on exertion which she attributes to inactivity. Reports no chest pain, pressure, or tightness. No edema, orthopnea, PND.   EKGs/Labs/Other Studies Reviewed:   The following studies were reviewed today: ETT 04/29/16  Blood pressure demonstrated a hypertensive response to exercise.  There was no ST segment deviation noted during stress.  No T wave inversion was noted during stress.   Normal treadmill stress test with no evidence of ischemia. Poor exercise capacity with hypertensive response to exercise  Echo 05/2018 - Left  ventricle: The cavity size was normal. There was mild    concentric hypertrophy. Systolic function was normal. The    estimated ejection fraction was in the range of 50% to 55%. Wall    motion was normal; there were no regional wall motion    abnormalities. Doppler parameters are consistent with abnormal    left ventricular relaxation (grade 1 diastolic dysfunction).  - Pericardium, extracardiac: There was no pericardial effusion.    EKG:  EKG is ordered today.  The ekg ordered today demonstrates NSR 90bpm with no acute ST/T wave changes.   Recent Labs: 06/16/2020: TSH 0.093 07/15/2020: Magnesium 1.6 07/29/2020: ALT 24; BUN 18; Creatinine, Ser 0.79; Hemoglobin 12.8; Platelets 102; Potassium 3.6; Sodium 139  Recent Lipid Panel    Component Value Date/Time   CHOL 163 02/20/2020 1045   TRIG 110.0 02/20/2020 1045   HDL 40.70 02/20/2020 1045   CHOLHDL 4 02/20/2020 1045   VLDL 22.0 02/20/2020 1045   LDLCALC 100 (H) 02/20/2020 1045   LDLDIRECT 126.0 07/11/2015 0956  Home Medications   Current Meds  Medication Sig  . clonazePAM (KLONOPIN) 0.5 MG tablet Take 1.5 mg by mouth 2 (two) times daily.   . diphenoxylate-atropine (LOMOTIL) 2.5-0.025 MG tablet Take 1 tablet by mouth 4 (four) times daily as needed for diarrhea or loose stools.  Marland Kitchen escitalopram (LEXAPRO) 20 MG tablet Take 20 mg by mouth every morning.   Marland Kitchen levothyroxine (SYNTHROID) 125 MCG tablet TAKE 1 TABLET BY MOUTH EVERY DAY  . magnesium oxide (MAG-OX) 400 MG tablet Take 400 mg by mouth daily.  . metoprolol tartrate (LOPRESSOR) 25 MG tablet Take 1 tablet (25 mg total) by mouth as needed (As needed up to twice daily for palpitations).  Marland Kitchen oxyCODONE-acetaminophen (PERCOCET) 7.5-325 MG tablet Take 2 tablets by mouth every 6 (six) hours as needed for severe pain.  . pantoprazole (PROTONIX) 40 MG tablet TAKE 1 TABLET BY MOUTH EVERY DAY  . potassium chloride SA (KLOR-CON) 20 MEQ tablet Take 1 tablet (20 mEq total) by mouth 2 (two) times  daily.  . valACYclovir (VALTREX) 500 MG tablet Take 500 mg by mouth daily.   Current Facility-Administered Medications for the 08/09/20 encounter (Office Visit) with Loel Dubonnet, NP  Medication  . ondansetron (ZOFRAN) tablet 8 mg    Review of Systems      All other systems reviewed and are otherwise negative except as noted above.  Physical Exam    VS:  BP 118/80 (BP Location: Left Arm, Patient Position: Sitting, Cuff Size: Normal)   Pulse 90   Ht 5\' 4"  (1.626 m)   Wt 125 lb (56.7 kg)   SpO2 96%   BMI 21.46 kg/m  , BMI Body mass index is 21.46 kg/m. GEN: Well nourished, well developed, in no acute distress. HEENT: normal. Neck: Supple, no JVD, carotid bruits, or masses. Cardiac: RRR, no murmurs, rubs, or gallops. No clubbing, cyanosis, edema.  Radials/DP/PT 2+ and equal bilaterally.  Respiratory:  Respirations regular and unlabored, clear to auscultation bilaterally. GI: Soft, nontender, nondistended, BS + x 4. MS: No deformity or atrophy. Skin: Warm and dry, no rash. Neuro:  Strength and sensation are intact. Psych: Normal affect.  Assessment & Plan    1. Hx of recurrent pericarditis - Reports no recurrent symptoms. Echo 05/2018 LVEF 50-55%, no RWMA, gr1DD, no pericardial effusion.    2. Palpitations / Hypokalemia / Hypomagnesemia - Reports increasing frequency of palpitations. Recent BMP and CBC by PCP with no anemia nor hypokalemia.  Continue potassium and magnesium as prescribed by PCP. EKG without arrhythmia. Rx Lopressor 12.5mg  twice daily as needed for palpitations. 14 day ZIO XT and echo to rule out significant arrhythmia and valve abnormality as contributory. Encouraged her to stay well hydrated as she is only drinking 1.5 sixteen ounce bottles of water per day. No caffeine intake nor proarrhythmic medications.   Disposition: Echo and ZIO. Follow up in 6 week(s) with Dr. Fletcher Anon or APP.    Loel Dubonnet, NP 08/09/2020, 8:45 PM

## 2020-08-09 NOTE — Patient Instructions (Addendum)
Medication Instructions:   Your physician has recommended you make the following change in your medication:  START Metoprolol Tartrate 12.5mg  (half tablet) as needed up to twice daily for palpitations  *If you need a refill on your cardiac medications before your next appointment, please call your pharmacy*   Lab Work: None ordered today  Testing/Procedures: Your physician has requested that you have an echocardiogram. Echocardiography is a painless test that uses sound waves to create images of your heart. It provides your doctor with information about the size and shape of your heart and how well your heart's chambers and valves are working. This procedure takes approximately one hour. There are no restrictions for this procedure.  Your physician has recommended that you wear a Zio monitor. This monitor is a medical device that records the heart's electrical activity. Doctors most often use these monitors to diagnose arrhythmias. Arrhythmias are problems with the speed or rhythm of the heartbeat. The monitor is a small device applied to your chest. You can wear one while you do your normal daily activities. While wearing this monitor if you have any symptoms to push the button and record what you felt. Once you have worn this monitor for the period of time provider prescribed (Usually 14 days), you will return the monitor device in the postage paid box. Once it is returned they will download the data collected and provide [place] with a report which the provider will then review and we will call you with those results. Important tips:  1. Avoid showering during the first 24 hours of wearing the monitor. 2. Avoid excessive sweating to help maximize wear time. 3. Do not submerge the device, no hot tubs, and no swimming pools. 4. Keep any lotions or oils away from the patch. 5. After 24 hours you may shower with the patch on. Take brief showers with your back facing the shower head.  6. Do not remove  patch once it has been placed because that will interrupt data and decrease adhesive wear time. 7. Push the button when you have any symptoms and write down what you were feeling. 8. Once you have completed wearing your monitor, remove and place into box which has postage paid and place in your outgoing mailbox.  9. If for some reason you have misplaced your box then call our office and we can provide another box and/or mail it off for you.         Follow-Up: At [facility], you and your health needs are our priority.  As part of our continuing mission to provide you with exceptional heart care, we have created designated Provider Care Teams.  These Care Teams include your primary Cardiologist (physician) and Advanced Practice Providers (APPs -  Physician Assistants and Nurse Practitioners) who all work together to provide you with the care you need, when you need it.  We recommend signing up for the patient portal called "MyChart".  Sign up information is provided on this After Visit Summary.  MyChart is used to connect with patients for Virtual Visits (Telemedicine).  Patients are able to view lab/test results, encounter notes, upcoming appointments, etc.  Non-urgent messages can be sent to your provider as well.   To learn more about what you can do with MyChart, go to [email or web address].    Your next appointment:   6 weeks  The format for your next appointment:   In Person  Provider:   You may see Kathlyn Sacramento, MD or one of the  following Advanced Practice Providers on your designated Care Team:    Murray Hodgkins, NP  Christell Faith, PA-C  Marrianne Mood, PA-C  Cadence Levelock, [place]  Laurann Montana, NP  Other Instructions  To prevent palpitations: Make sure you are adequately hydrated.  Avoid and/or limit caffeine containing beverages like soda or tea. Exercise regularly.  Manage stress well.  Some over the counter medications can cause palpitations such as  Benadryl, AdvilPM, TylenolPM. Regular Advil or Tylenol do not cause palpitations.    Pursed Lip Breathing  How to perform pursed lip breathing Being short of breath can make you tense and anxious. Before you start this breathing exercise, take a minute to relax your shoulders and close your eyes. Then: 1. Start the exercise by closing your mouth. 2. Breathe in through your nose, taking a normal breath. You can do this at your normal rate of breathing. If you feel you are not getting enough air, breathe in while slowly counting to 2 or 3. 3. Pucker (purse) your lips as if you were going to whistle. 4. Gently tighten the muscles of your abdomenor press on your abdomen to help push the air out. 5. Breathe out slowly through your pursed lips. Take at least twice as long to breathe out as it takes you to breathe in. 6. Make sure that you breathe out all of the air, but do not force air out. 7. Repeat the exercise until your breathing improves. Ask your health care provider how often and how long to do this exercise.

## 2020-08-12 ENCOUNTER — Inpatient Hospital Stay (HOSPITAL_BASED_OUTPATIENT_CLINIC_OR_DEPARTMENT_OTHER): Payer: PPO | Admitting: Oncology

## 2020-08-12 ENCOUNTER — Inpatient Hospital Stay: Payer: PPO

## 2020-08-12 DIAGNOSIS — C9200 Acute myeloblastic leukemia, not having achieved remission: Secondary | ICD-10-CM

## 2020-08-12 NOTE — Progress Notes (Signed)
Error. Patient not seen.

## 2020-08-13 ENCOUNTER — Telehealth
Admit: 2020-08-13 | Discharge: 2020-08-14 | Payer: PRIVATE HEALTH INSURANCE | Attending: Adult Health | Primary: Adult Health

## 2020-08-13 DIAGNOSIS — C9201 Acute myeloblastic leukemia, in remission: Secondary | ICD-10-CM | POA: Diagnosis not present

## 2020-08-13 NOTE — Unmapped (Addendum)
This patient visit was completed through the use of an audio/video or telephone encounter.    I spent 8 minutes on the real-time audio and video with the patient. I spent an additional 15 minutes on pre- and post-visit activities.     The patient was physically located in [place] or a state in which I am permitted to provide care. The patient understood that s/he may incur co-pays and cost sharing, and agreed to the telemedicine visit. The visit was completed via phone and/or video, which was appropriate and reasonable under the circumstances given the patient's presentation at the time.    The patient has been advised of the potential risks and limitations of this mode of treatment (including, but not limited to, the absence of in-person examination) and has agreed to be treated using telemedicine. The patient's/patient's family's questions regarding telemedicine have been answered.     If the phone/video visit was completed in an ambulatory setting, the patient has also been advised to contact their provider???s office for worsening conditions, and seek emergency medical treatment and/or call 911 if the patient deems either necessary.    Visit conducted by: Video  Person contacted: Stephanie Jimenez phone number: [phone]  Is there someone else in the room? no       [facility] Follow-up Visit Note     Patient Name: Stephanie Jimenez  Patient Age: 70 y.o.  Encounter Date: 08/13/2020    Primary Care Provider:  Charm Barges, MD    Referring Physician:  [name], MD  [address]  [place],  [place] [address]    Reason for visit:  AML follow up    Current therapy:   AZA single agent    Assessment/Plan:  Stephanie Jimenez is a 70 y.o. female with past medical history of anxiety, breast cancer, colon cancer, hypothyroidism, and GERD who presents to establish care for AML. Diagnosed on bone marrow biopsy on 04/08/2020 while undergoing evaluation for persistent leukopenia. She has a remote history (~10-15 years ago) of systemic chemotherapy for breast and colon cancer which raises suspicion of therapy-related AML. However, cytogenetics were not specific or supportive of this diagnosis.     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 2010, Blood, PMID: 40102725) . For older patients (age > 73) unable to undergo intensive induction chemotherapy, the use of venetoclax in combination with an HMA (decitabine or azacitidine) resulted in an CR+CRi of 73%, with a median duration of 11.3 months, and a median overall survival of 17.5 months (DiNardo et al. 2019, Blood, PMID: 36644034). Both, inpatient (high intensity induction chemotherapy) and outpatient (AZA/Ven) treatment options were presented and discussed with the patient. Through shared decision making we decided to go forward with AZA/Ven.    Additionally, after our initial visit with the patient, she was found to have an NPM1 mutation, along with an IDH1 mutation and CEBPA mutation.  These mutations together confer favorable response to AZA/VEN (ignoring the potential for treatment related AML).  Older (>65 years) patients with NPM1 mutated AML had a 88% CR rate with AZA/VEN, and a mOS not reached (Lachowiez, Blood Advances 2020).  IDH1 mutations are also known to confer increased response rates to venetoclax.      She tolerated the first cycle of AZA/Ven very well and post C1 BMBx was consistent with disease remission.   C2 of AZA/VEN complicated by severe diarrhea, leading to shortening of AZA to 5 (from 7) days.     For subsequent cycles: Hold Parameters: Hold treatment for platelets < 25 and ANC < 0.5.  Patients with prolonged cytopenias (i.e. > two weeks) should have a bone marrow biopsy.   Dose reductions: If warranted due to prolonged neutrophil or platelet recovery  ??? First: Reduce VEN to 21 days (from 28 days)  ??? Second: Reduce AZA to 5 days (from 7 days)   ??? Third: Reduce VEN to 14 days (from 21 days)     Supportive Care   ??? Levaquin for ANC < 500  ??? Posaconazole for patients at high risk for invasive fungal infections with prolonged neutropenia or history of invasive fungal infection, exposure to broad-spectrum antibiotics, loss of barrier integrity, high-risk exposure.  If this drug is to be used, venetoclax should be reduced to 100 mg a day.     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  By ELN criteria (again ignoring the history of chemotherapy), this would be favorable risk disease with a high likelihood of responding to chemotherapy. She had a great response to AZA/Ven and achieved remission after C1.   - Continue AZA/VEN - Anticipate C3 after count recovery    - Off posa  - continue Levo 500 mg daily for neutropenia when ANC <0.5  - follow up with Dr. Milinda Cave office   - can re-start aza and venetoclax if platelets >25k and ANC > 0.5  - video visit follow up in 1 month    Addendum:   She started Cycle 3 on 3/28 - venetoclax was causing severe diarrhea and has stopped since stopping her ven. Also complicated by perirectal abscess.  There was no dose reduction of ven - was stopped on 3/7.  She was taking full dose ven since off posa, but also Care Everywhere documentation reports that she was often confused about what medications she was taking and on occasion reported she wasn't taking it at all.Marland Kitchen  She was off posa since after cycle 1 with remission.  Her counts had recovered on 3/28 with platelets >100 and ANC >1.  Her Aza was dose reduced for cycle 3 to 50mg /m2.    Treatment timeline:  05/03/2020: C1D1 Aza/Ven  05/27/20: BMBx post C1 demonstrates MLFS   06/10/20: C2D1 Aza/VEN  - Only received 5d of AZA due to diarrhea, perirectal abscess   07/16/20: BMBx at [facility]: variably cellular bone marrow with erythroid predominance, megakaryocytic hypoplasia, monotypic B cell lyphoproliferations and no increase in blasts.  No morphologic evidence of AML; still with abnormal karyotype with previous translocation still present  07/29/20: C3D1 delayed 2/2 count recovery and diarrhea.  Restarted on aza 50mg /m2 x 5 days; no ven    Diarrhea: Unclear etiology. Likely related to venetoclax.   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 Unclear significance in the absence of lymphadenopathy, splenomegaly, or other signs of a lymphoid neoplasm.  - CTM    Hypothyroidism: Secondary to treatment of thyroid cancer.  - Consider repeat TSH in 4-weeks which was elevated on most recent testing  - Continue levothyroxine    Psychosocial distress: She reports a high level of  anxiety regarding the management of the above.   - Counseling given   - Consider ref to comprehensive cancer support program     Patient-centered care/Shared decision-making:   We discussed the plan above at length. The patient and her friend actively contributed to the conversation. Specifically, her most important outcomes are:   1) Prolonging OS  2) Avoiding Long-term side effects   3) Maintaining her overall functional activity     Supportive Care Needs: We recommend based on the patient???s underlying diagnosis and treatment history the following supportive care:    1. Antimicrobial prophylaxis:  None    2. Blood product support:  Leukoreduced blood products are required.  Irradiated blood products are preferred, but in case of urgent transfusion needs non-irradiated blood products may be used: 2 units for Hg <= 7.0.     Coordination of care:   - continue with Cycle 3, next cycle to begin in 2 weeks (late April)  - follow up with Dr. Senaida Ores on 5/4    Markus Jarvis, RN, MSN, AGPCNP-C  Nurse Practitioner  Hematologic Malignancies  [facility]  [phone] (phone)  [phone] (fax)  [email or web address] [email or web address]      Mariel Aloe, MD was available  Leukemia Program  Division of Hematology  [facility]      Nurse Navigator (non-clinical trial patients):       Tel. [phone]       Fax. [phone]  Toll-free appointments: [phone]  Scheduling assistance: [phone]  After hours/weekends: [phone] (ask for adult hematology/oncology on-call)        History of Present Illness:   BLESSIN KANNO is a 70 y.o. female with past medical history noted as above who presents to establish care for AML.    She presented for evaluation of incidentally noted leukopenia in November 2021. Labs obtained around that time showed a normal iron panel, folate, vitamin B12, an elevated TSH, and flow cytometry with 6% myeloblast. She underwent a bone marrow biopsy on December 6th which showed a normocellular bone marrow (30%) involved by therapy-related acute myeloid leukemia (26% blasts by manual aspirate differential) and with a monotypic B-cell population. Review of the EMR shows that her leukopenia dates back to at least June 2020. She reports having a viral illness at the end of 2019 which required a prolonged recovery time. Since then her main health problem has been bilateral hip pain. 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 She has been doing well with physical therapy but apparently will require L hip replacement eventually.    She has a history of colon cancer that was diagnosed in her 12s. She reports having a partial colectomy and undergoing chemotherapy and radiation. She later developed breast cancer in her late 55s which was also treated with resection and chemotherapy. She does not recall the specific chemotherapy regimens or dates treatment. She also has a history of thyroid cancer and s/p resection in 2001.   ??  Her social history is notable for being retired and no history of tobacco, alcohol, or drug use. She lives in [place], [place].     Family history is remarkable for cancer in her father (lung and prostate) and sister (breast), and Alzheimer's in her mother    Interval history:  Doing fair other than still being very tired.  Stopped venetoclax per Dr. Milinda Cave direction.  Diarrhea has stopped since stopping the venetoclax.  CbC on 3/28 was appropriate to begin therapy.    Otherwise, denies new bone pain,  fevers, chills, night sweats, lumps/bumps, tongue swelling, shortness of breath, syncope, lightheadedness, constipation or diarrhea, nausea or vomiting, very easy bruising or bleeding, or urinary changes.         Oncology History is as below:   Oncology History Overview Note   Referring/Local Oncologist: Dr. Orlie Dakin, [facility]    Diagnosis:   Final Diagnosis   Date Value Ref Range Status   04/08/2020   Final    (Outside case: [phone], dated 04/08/2020)  Bone marrow, aspiration and biopsy  -  Normocellular bone marrow (30%) involved by therapy-related acute myeloid leukemia (26% blasts by manual aspirate differential) (See Comment)  -   Monotypic B-cell population with partial CD5 expression identified by outside flow cytometric analysis (See Comment)    This electronic signature is attestation that the pathologist personally reviewed the submitted material(s) and the final diagnosis reflects that evaluation.            Genetics:   Karyotype/FISH: 46,XX,t(1;12)(p36.3;q13)[9]/46,XX[11]     Molecular Genetics: NPM1, IDH1, CEBPA (single allele)          Acute myeloid leukemia in remission (CMS-HCC)   04/12/2020 Initial Diagnosis    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CMS-HCC)     05/03/2020 -  Chemotherapy    IP/OP AML AZACITIDINE + VENETOCLAX  azacitidine 75 mg/m2 SQ on days 1-7, venetoclax ramp up week 1; dose dependent, then azacitidine 75 mg/m2 SQ on days 1-7 every 28 days. 05/27/2020 Biopsy    Final Diagnosis   Bone marrow, right iliac, aspiration and biopsy  -  Hypocellular bone marrow (5-10%) with reduced erythroid-predominant trilineage hematopoiesis, mild cytologic dyspoiesis and <1% blasts by manual aspirate differential            Review of Systems:   ROS reviewed and negative except as noted in H and P     Allergies:  Allergies   Allergen Reactions   ??? Demeclocycline Other (See Comments)     Throat swells  Throat swells     ??? Sulfa (Sulfonamide Antibiotics) Other (See Comments)     Throat swells   ??? Tetracycline Swelling     Throat swelling   ??? Amoxicillin-Pot Clavulanate Diarrhea   ??? Ciprofloxacin Diarrhea   ??? Codeine Dizziness and Other (See Comments)     dizziness     ??? Procaine Other (See Comments)     Shaky   ??? Epinephrine Palpitations     Speeds heart up - panic      ??? Metronidazole Nausea And Vomiting   ??? Prednisone Anxiety     Nervous   Nervous      ??? Tramadol Nausea Only and Other (See Comments)     Sick feeling  Sick feeling         Medications:     Current Outpatient Medications:   ???  azelastine-fluticasone (DYMISTA) 137-50 mcg/spray nasal spray, 2 sprays each nostril q day, Disp: , Rfl:   ???  clonazePAM (KLONOPIN) 0.5 MG tablet, Take 0.75 mg by mouth two (2) times a day. , Disp: , Rfl:   ???  escitalopram oxalate (LEXAPRO) 20 MG tablet, Take 20 mg by mouth every morning., Disp: , Rfl:   ???  levoFLOXacin (LEVAQUIN) 500 MG tablet, Take 1 tablet (500 mg total) by mouth daily. (Patient not taking: Reported on 07/04/2020), Disp: 30 tablet, Rfl: 2  ???  levothyroxine (SYNTHROID) 125 MCG tablet, Take 1 tablet by mouth daily., Disp: , Rfl:   ???  loperamide (IMODIUM) 2 mg capsule, Take 2  mg by mouth four (4) times a day as needed., Disp: , Rfl:   ???  magnesium oxide (MAG-OX) 400 mg (241.3 mg elemental magnesium) tablet, Take 1 tablet by mouth daily., Disp: , Rfl:   ???  ondansetron (ZOFRAN) 8 MG tablet, Take 1 tablet (8 mg total) by mouth every eight (8) hours as needed for nausea. (Patient not taking: Reported on 07/04/2020), Disp: 30 tablet, Rfl: 2  ???  pantoprazole (PROTONIX) 40 MG tablet, Take 1 tablet by mouth daily., Disp: , Rfl:   ???  posaconazole (NOXAFIL) 100 mg TbEC delayed released tablet, Take 300 mg by mouth daily. (Patient not taking: Reported on 06/04/2020), Disp: 90 tablet, Rfl: 5  ???  potassium chloride (KLOR-CON) 10 MEQ CR tablet, Take 1 tablet by mouth daily., Disp: , Rfl:   ???  valACYclovir (VALTREX) 500 MG tablet, TAKE 1 TABLET (500 MG TOTAL) BY MOUTH DAILY., Disp: 90 tablet, Rfl: 0  ???  venetoclax (VENCLEXTA) 100 mg tablet, Take 1 tablet (100 mg total) by mouth daily. Take with a meal and water. Do not chew, crush, or break tablets. (Patient not taking: Reported on 06/04/2020), Disp: 30 tablet, Rfl: 0    Medical History:  As per HPI    Social History:  As per HPI    Family History:  As per HPI    Objective:   There were no vitals taken for this visit. - VIDEO VISIT    GENERAL: Well-appearing white woman. NAD. Alone.   HEENT: Pupils equal, round. EOMI.   HEART: Normal color, not excessive pallor. Not ashen.   CHEST/LUNG: Normal work of breathing. No dyspnea with conversation.   EXTREMITIES: No edema, cyanosis or clubbing in viewed UE.   SKIN: No noticable rash or petechiae.   NEURO EXAM: Grossly intact.       Test Results:  No results found for this or any previous visit (from the past 48 hour(s)).

## 2020-08-14 ENCOUNTER — Telehealth: Payer: Self-pay | Admitting: Nurse Practitioner

## 2020-08-14 ENCOUNTER — Ambulatory Visit (INDEPENDENT_AMBULATORY_CARE_PROVIDER_SITE_OTHER): Payer: PPO | Admitting: Surgery

## 2020-08-14 ENCOUNTER — Encounter: Payer: Self-pay | Admitting: Surgery

## 2020-08-14 ENCOUNTER — Other Ambulatory Visit: Payer: PPO | Admitting: Nurse Practitioner

## 2020-08-14 ENCOUNTER — Other Ambulatory Visit: Payer: Self-pay

## 2020-08-14 VITALS — BP 123/75 | HR 88 | Temp 98.3°F | Ht 64.0 in | Wt 127.0 lb

## 2020-08-14 DIAGNOSIS — K611 Rectal abscess: Secondary | ICD-10-CM

## 2020-08-14 DIAGNOSIS — Z48815 Encounter for surgical aftercare following surgery on the digestive system: Secondary | ICD-10-CM

## 2020-08-14 NOTE — Unmapped (Signed)
Video visit

## 2020-08-14 NOTE — Progress Notes (Signed)
Outpatient Surgical Follow Up  08/14/2020  Debra Hale is an 70 y.o. female.   Chief Complaint  Patient presents with  . Routine Post Op    HPI: 70 year old female with a history of AML neutropenia thrombocytopenia status post I&D of perirectal abscess.  Currently she is doing very well from a rectal perspective no pain.  No discharge from her wound seems to have healed.  No further complaints or concerns at this time  Past Medical History:  Diagnosis Date  . Anxiety   . Arthritis    Osteoarthritis  . Breast cancer ([place]) 2009   right breast lumpectomy with rad tx  . Colon cancer ([place])    surgery with chemo and rad tx  . Complication of anesthesia   . GERD (gastroesophageal reflux disease)   . Heart palpitations   . History of hiatal hernia   . History of kidney stones   . Hypothyroidism   . Liver disease   . Liver nodule    s/p negative biopsy  . Malignant neoplasm of thyroid gland ([place]) 2002   s/p surgery and XRT  . Osteoporosis   . Other and unspecified hyperlipidemia   . Palpitations   . Personal history of chemotherapy   . Personal history of malignant neoplasm of large intestine    carcinoma - cecum, s/p right laparoscopic colectomy - s/p chemotherapy and XRT  . Personal history of radiation therapy   . Pneumonia 2019  . PONV (postoperative nausea and vomiting)   . Pure hypercholesterolemia   . Unspecified hereditary and idiopathic peripheral neuropathy     Past Surgical History:  Procedure Laterality Date  . APPENDECTOMY  1985  . BREAST BIOPSY Right 2009   positive  . BREAST BIOPSY Right 2009   negative  . BREAST LUMPECTOMY Right 2009   breast cancer  . CHOLECYSTECTOMY  1995  . COLONOSCOPY    . COLONOSCOPY WITH PROPOFOL N/A 10/29/2017   Procedure: COLONOSCOPY WITH PROPOFOL;  Surgeon: Manya Silvas, MD;  Location: [facility] ENDOSCOPY;  Service: Endoscopy;  Laterality: N/A;  . DILATION AND CURETTAGE OF UTERUS  1990  . DILATION AND CURETTAGE, DIAGNOSTIC  / THERAPEUTIC  1990  . ESOPHAGOGASTRODUODENOSCOPY    . ESOPHAGOGASTRODUODENOSCOPY (EGD) WITH PROPOFOL N/A 10/29/2017   Procedure: ESOPHAGOGASTRODUODENOSCOPY (EGD) WITH PROPOFOL;  Surgeon: Manya Silvas, MD;  Location: [facility] ENDOSCOPY;  Service: Endoscopy;  Laterality: N/A;  . INCISION AND DRAINAGE PERIRECTAL ABSCESS N/A 06/17/2020   Procedure: IRRIGATION AND DEBRIDEMENT PERIRECTAL ABSCESS;  Surgeon: Jules Husbands, MD;  Location: [facility] ORS;  Service: General;  Laterality: N/A;  . LAPAROSCOPIC PARTIAL COLECTOMY     stage 3-C carcinoma of the cecum, s/p chemotherapy and xrt  . LITHOTRIPSY    . SIGMOIDOSCOPY  08/26/1993  . THYROID LOBECTOMY  2002   s/p XRT  . TOTAL HIP ARTHROPLASTY Right 10/24/2019   Procedure: TOTAL HIP ARTHROPLASTY;  Surgeon: Corky Mull, MD;  Location: [facility] ORS;  Service: Orthopedics;  Laterality: Right;    Family History  Problem Relation Age of Onset  . Stroke Mother        47s  . Alzheimer's disease Mother   . Cancer Mother   . Lung cancer Father   . Prostate cancer Father   . Cancer Father        Colon  . Colon cancer Father   . Breast cancer Sister        8's  . Lung cancer Sister   . Breast cancer Maternal Aunt  Social History:  reports that she has never smoked. She has never used smokeless tobacco. She reports that she does not drink alcohol and does not use drugs.  Allergies:  Allergies  Allergen Reactions  . Demeclocycline Other (See Comments)    Throat swells  . Sulfa Antibiotics     Other reaction(s): Other (See Comments) Throat swells  . Tetracyclines & Related Other (See Comments)    Throat swells   . Augmentin [Amoxicillin-Pot Clavulanate] Diarrhea  . Bentyl [Dicyclomine Hcl]     unkn  . Ciprofloxacin Diarrhea  . Codeine Other (See Comments)    dizziness    . Epinephrine     Speeds heart up - panic   . Flagyl [Metronidazole] Nausea And Vomiting  . Librax [Chlordiazepoxide-Clidinium]     unkn  . Novocain [Procaine] Other  (See Comments)    "Shaky"  . Phenobarbital     unkn  . Prednisone     Nervous    . Ultram [Tramadol] Other (See Comments)    Sick feeling    Medications reviewed.    ROS Full ROS performed and is otherwise negative other than what is stated in HPI   BP 123/75   Pulse 88   Temp 98.3 F (36.8 C)   Ht 5\' 4"  (1.626 m)   Wt 127 lb (57.6 kg)   SpO2 98%   BMI 21.80 kg/m   Physical Exam Vitals and nursing note reviewed. Exam conducted with a chaperone present.  Constitutional:      General: She is not in acute distress.    Appearance: Normal appearance.  Abdominal:     General: Abdomen is flat. There is no distension.     Palpations: Abdomen is soft. There is no mass.     Tenderness: There is no abdominal tenderness. There is no guarding or rebound.     Hernia: No hernia is present.  Genitourinary:    Comments: Rectal:.  Completely healed perianal wound.  No infection no evidence of abscess no evidence of necrotizing infection Musculoskeletal:        General: Normal range of motion.  Skin:    General: Skin is warm and dry.     Capillary Refill: Capillary refill takes less than 2 seconds.  Neurological:     General: No focal deficit present.     Mental Status: She is alert and oriented to person, place, and time.  Psychiatric:        Mood and Affect: Mood normal.        Behavior: Behavior normal.        Thought Content: Thought content normal.        Judgment: Judgment normal.         Assessment/Plan: 70 year old female status post I&D of perirectal abscess.  This has completely resolved.  No surgical issues at this time. No Need for further follow-up.   Greater than 50% of the 20 minutes  visit was spent in counseling/coordination of care   Caroleen Hamman, MD [facility] [place] Surgeon

## 2020-08-14 NOTE — Telephone Encounter (Signed)
I called Ms. Beddow for telemedicine visit, no answer, message left with contact information to return call

## 2020-08-14 NOTE — Patient Instructions (Signed)
   Follow-up with our office as needed.  Please call and ask to speak with a nurse if you develop questions or concerns.  

## 2020-08-20 DIAGNOSIS — C9200 Acute myeloblastic leukemia, not having achieved remission: Principal | ICD-10-CM

## 2020-08-20 MED ORDER — VENETOCLAX 100 MG TABLET
ORAL_TABLET | 2 refills | 0 days | Status: CP
Start: 2020-08-20 — End: ?

## 2020-08-20 NOTE — Unmapped (Signed)
Clinical Assessment Needed For: Dose Change  Medication: Venclexta 100mg  tablets  Last Fill Date/Day Supply: 05/10/20 / 30 days  Copay $0  Was previous dose already scheduled to fill: No    Notes to Pharmacist:

## 2020-08-21 NOTE — Unmapped (Signed)
Addended by: Synetta Fail K on: 08/20/2020 09:06 PM     Modules accepted: Level of Service

## 2020-08-23 ENCOUNTER — Other Ambulatory Visit: Payer: Self-pay | Admitting: Family

## 2020-08-23 DIAGNOSIS — R0600 Dyspnea, unspecified: Secondary | ICD-10-CM

## 2020-08-23 NOTE — Unmapped (Signed)
Specialty Medication(s): Posaconazole and venclexta    [name] has been dis-enrolled from the [facility] specialty pharmacy services due to medications discontinued.    Additional information provided to the patient: na    Seanpatrick Maisano A [facility] Specialty Pharmacist

## 2020-08-23 NOTE — Progress Notes (Signed)
[facility]  [phone] [phone]) [phone]  ID: [facility] RUNKEL OB: 1950-09-29  MR#: [id number]  TTS#:[id number]  Patient Care Team: [name], MD as PCP - General (Internal Medicine) Wellington Hampshire, MD as PCP - Cardiology (Cardiology)  CHIEF COMPLAINT: AML.  INTERVAL HISTORY: Patient returns to clinic today for further evaluation and continuation of cycle 4 of Vidaza.  Her diarrhea has significantly improved and is well controlled with Imodium.  She has a poor appetite and weight loss over the past several weeks.  She otherwise is tolerating her treatments well without significant side effects.  She continues to have chronic weakness and fatigue. She has no neurologic complaints.  She denies any recent fevers or illnesses. She has no chest pain, shortness of breath, cough, or hemoptysis.  She denies any nausea, vomiting, or constipation.  She has no urinary complaints.  Patient offers no further specific complaints today.  REVIEW OF SYSTEMS:   Review of Systems  Constitutional: Positive for malaise/fatigue and weight loss. Negative for fever.  Respiratory: Negative.  Negative for cough, hemoptysis and shortness of breath.   Cardiovascular: Negative.  Negative for chest pain and leg swelling.  Gastrointestinal: Positive for diarrhea. Negative for abdominal pain.  Genitourinary: Negative.  Negative for dysuria.  Musculoskeletal: Negative.  Negative for back pain.  Skin: Negative.  Negative for rash.  Neurological: Positive for weakness. Negative for dizziness, focal weakness and headaches.  Psychiatric/Behavioral: Positive for memory loss. The patient is nervous/anxious.     As per HPI. Otherwise, a complete review of systems is negative.  PAST MEDICAL HISTORY: Past Medical History:  Diagnosis Date  . Anxiety   . Arthritis    Osteoarthritis  . Breast cancer ([place]) 2009   right breast lumpectomy with rad tx  . Colon cancer ([place])    surgery  with chemo and rad tx  . Complication of anesthesia   . GERD (gastroesophageal reflux disease)   . Heart palpitations   . History of hiatal hernia   . History of kidney stones   . Hypothyroidism   . Liver disease   . Liver nodule    s/p negative biopsy  . Malignant neoplasm of thyroid gland ([place]) 2002   s/p surgery and XRT  . Osteoporosis   . Other and unspecified hyperlipidemia   . Palpitations   . Personal history of chemotherapy   . Personal history of malignant neoplasm of large intestine    carcinoma - cecum, s/p right laparoscopic colectomy - s/p chemotherapy and XRT  . Personal history of radiation therapy   . Pneumonia 2019  . PONV (postoperative nausea and vomiting)   . Pure hypercholesterolemia   . Unspecified hereditary and idiopathic peripheral neuropathy     PAST SURGICAL HISTORY: Past Surgical History:  Procedure Laterality Date  . APPENDECTOMY  1985  . BREAST BIOPSY Right 2009   positive  . BREAST BIOPSY Right 2009   negative  . BREAST LUMPECTOMY Right 2009   breast cancer  . CHOLECYSTECTOMY  1995  . COLONOSCOPY    . COLONOSCOPY WITH PROPOFOL N/A 10/29/2017   Procedure: COLONOSCOPY WITH PROPOFOL;  Surgeon: Manya Silvas, MD;  Location: [facility] ENDOSCOPY;  Service: Endoscopy;  Laterality: N/A;  . DILATION AND CURETTAGE OF UTERUS  1990  . DILATION AND CURETTAGE, DIAGNOSTIC / THERAPEUTIC  1990  . ESOPHAGOGASTRODUODENOSCOPY    . ESOPHAGOGASTRODUODENOSCOPY (EGD) WITH PROPOFOL N/A 10/29/2017   Procedure: ESOPHAGOGASTRODUODENOSCOPY (EGD) WITH PROPOFOL;  Surgeon: Manya Silvas, MD;  Location: [facility]  ENDOSCOPY;  Service: Endoscopy;  Laterality: N/A;  . INCISION AND DRAINAGE PERIRECTAL ABSCESS N/A 06/17/2020   Procedure: IRRIGATION AND DEBRIDEMENT PERIRECTAL ABSCESS;  Surgeon: Jules Husbands, MD;  Location: [facility] ORS;  Service: General;  Laterality: N/A;  . LAPAROSCOPIC PARTIAL COLECTOMY     stage 3-C carcinoma of the cecum, s/p chemotherapy and xrt  . LITHOTRIPSY     . SIGMOIDOSCOPY  08/26/1993  . THYROID LOBECTOMY  2002   s/p XRT  . TOTAL HIP ARTHROPLASTY Right 10/24/2019   Procedure: TOTAL HIP ARTHROPLASTY;  Surgeon: Corky Mull, MD;  Location: [facility] ORS;  Service: Orthopedics;  Laterality: Right;    FAMILY HISTORY: Family History  Problem Relation Age of Onset  . Stroke Mother        59s  . Alzheimer's disease Mother   . Cancer Mother   . Lung cancer Father   . Prostate cancer Father   . Cancer Father        Colon  . Colon cancer Father   . Breast cancer Sister        62's  . Lung cancer Sister   . Breast cancer Maternal Aunt     ADVANCED DIRECTIVES (Y/N):  N  HEALTH MAINTENANCE: Social History   Tobacco Use  . Smoking status: Never Smoker  . Smokeless tobacco: Never Used  Vaping Use  . Vaping Use: Never used  Substance Use Topics  . Alcohol use: No    Alcohol/week: 0.0 standard drinks  . Drug use: No     Colonoscopy:  PAP:  Bone density:  Lipid panel:  Allergies  Allergen Reactions  . Demeclocycline Other (See Comments)    Throat swells  . Sulfa Antibiotics     Other reaction(s): Other (See Comments) Throat swells  . Tetracyclines & Related Other (See Comments)    Throat swells   . Augmentin [Amoxicillin-Pot Clavulanate] Diarrhea  . Bentyl [Dicyclomine Hcl]     unkn  . Ciprofloxacin Diarrhea  . Codeine Other (See Comments)    dizziness    . Epinephrine     Speeds heart up - panic   . Flagyl [Metronidazole] Nausea And Vomiting  . Librax [Chlordiazepoxide-Clidinium]     unkn  . Novocain [Procaine] Other (See Comments)    "Shaky"  . Phenobarbital     unkn  . Prednisone     Nervous    . Ultram [Tramadol] Other (See Comments)    Sick feeling    Current Outpatient Medications  Medication Sig Dispense Refill  . clonazePAM (KLONOPIN) 0.5 MG tablet Take 1.5 mg by mouth 2 (two) times daily.   0  . diphenoxylate-atropine (LOMOTIL) 2.5-0.025 MG tablet Take 1 tablet by mouth 4 (four) times daily as  needed for diarrhea or loose stools. 120 tablet 0  . escitalopram (LEXAPRO) 20 MG tablet Take 20 mg by mouth every morning.     Marland Kitchen levothyroxine (SYNTHROID) 125 MCG tablet TAKE 1 TABLET BY MOUTH EVERY DAY 90 tablet 1  . metoprolol tartrate (LOPRESSOR) 25 MG tablet Take 1 tablet (25 mg total) by mouth as needed (As needed up to twice daily for palpitations). 60 tablet 1  . pantoprazole (PROTONIX) 40 MG tablet TAKE 1 TABLET BY MOUTH EVERY DAY 90 tablet 2  . valACYclovir (VALTREX) 500 MG tablet Take 500 mg by mouth daily.     Current Facility-Administered Medications  Medication Dose Route Frequency Provider Last Rate Last Admin  . ondansetron (ZOFRAN) tablet 8 mg  8 mg Oral Once Guse,  Jacquelynn Cree, FNP       Facility-Administered Medications Ordered in Other Visits  Medication Dose Route Frequency Provider Last Rate Last Admin  . potassium chloride (KLOR-CON) CR tablet 40 mEq  40 mEq Oral BID Lloyd Huger, MD   40 mEq at 07/08/20 1136    OBJECTIVE: Vitals:   08/27/20 0931  BP: 102/69  Pulse: 92  Resp: 17  Temp: (!) 97.4 F (36.3 C)  SpO2: 100%     Body mass index is 21.8 kg/m.    ECOG FS:1 - Symptomatic but completely ambulatory  General: Well-developed, well-nourished, no acute distress. Eyes: Pink conjunctiva, anicteric sclera. HEENT: Normocephalic, moist mucous membranes. Lungs: No audible wheezing or coughing. Heart: Regular rate and rhythm. Abdomen: Soft, nontender, no obvious distention. Musculoskeletal: No edema, cyanosis, or clubbing. Neuro: Alert, answering all questions appropriately. Cranial nerves grossly intact. Skin: No rashes or petechiae noted. Psych: Normal affect.  LAB RESULTS:  Lab Results  Component Value Date   NA 142 08/26/2020   K 4.3 08/26/2020   CL 106 08/26/2020   CO2 25 08/26/2020   GLUCOSE 99 08/26/2020   BUN 20 08/26/2020   CREATININE 0.92 08/26/2020   CALCIUM 9.2 08/26/2020   PROT 6.6 08/26/2020   ALBUMIN 3.6 08/26/2020   AST 20  08/26/2020   ALT 29 08/26/2020   ALKPHOS 112 08/26/2020   BILITOT 0.5 08/26/2020   GFRNONAA >60 08/26/2020   GFRAA 54 (L) 10/26/2019    Lab Results  Component Value Date   WBC 1.2 (LL) 08/26/2020   NEUTROABS 0.6 (L) 08/26/2020   HGB 12.8 08/26/2020   HCT 39.2 08/26/2020   MCV 85.2 08/26/2020   PLT 227 08/26/2020     STUDIES: No results found.  ASSESSMENT: AML.  PLAN:    1.  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  Her peripheral blood flow cytometry also revealed 6% aberrant myeloblasts.  Patient completed cycle 1 of induction therapy at [facility] with Vidaza and venetoclax.  She only received 5 days of Vidaza for cycle 2 recently which was interrupted secondary to significant diarrhea and a perirectal abscess.  Unclear of patient's compliance with venetoclax.  Repeat bone marrow biopsy on July 16, 2020 did not reveal any residual disease, but cytogenetics remained positive likely indicating a very good partial remission.  Given patient's difficulty with treatment, will discontinue venetoclax altogether and proceed with dose reduced Vidaza on days 1 through 5 on a 28-day cycle.  Proceed with cycle 4, day 2 of treatment today.  Return to clinic daily for the remainder of the week to complete cycle 4 of treatment.  Patient with then return to clinic in 2 weeks for laboratory work only and then in 4 weeks for further evaluation and consideration of cycle 5, day 1.  Consider repeating bone marrow biopsy at the conclusion of cycle 6.   2.  Neutropenia: Chronic and unchanged.  Discontinue venetoclax and dose reduce Vidaza as above. 3.  Thrombocytopenia: Resolved. 4.  Perirectal abscess: Resolved.   5.  Diarrhea: Significantly improved since discontinuing venetoclax.  Continue Lomotil as prescribed. 6.  Supportive care: Patient has been given referrals to home health and home palliative care.  Appreciate palliative care input.   7. 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  Patient also received chemotherapy and likely received Adriamycin, but exact regimen is unknown.  She completed  5 years of hormonal therapy in approximately June 2015. Currently, she has no evidence of disease.  Her most recent mammogram on March 05, 2020 was reported as BI-RADS 1.  Repeat in November 2022. 8.  History of colon cancer: Patient also states that she received chemotherapy for this, possibly FOLFOX but again this is unknown.  I spent a total of 30 minutes reviewing chart data, face-to-face evaluation with the patient, counseling and coordination of care as detailed above.    Patient expressed understanding and was in agreement with this plan. She also understands that She can call clinic at any time with any questions, concerns, or complaints.   Cancer Staging History of breast cancer Staging form: Breast, AJCC 7th Edition - Clinical stage from 01/07/2016: Stage IA (T1c, N0, M0) - Signed by Lloyd Huger, MD on 01/07/2016 Laterality: Right Estrogen receptor status: Positive Progesterone receptor status: Positive HER2 status: Negative   Lloyd Huger, MD   08/27/2020 1:10 PM

## 2020-08-26 ENCOUNTER — Inpatient Hospital Stay: Payer: PPO | Admitting: Pharmacist

## 2020-08-26 ENCOUNTER — Inpatient Hospital Stay: Payer: PPO

## 2020-08-26 VITALS — BP 118/70 | HR 87 | Temp 96.0°F | Resp 18 | Wt 127.0 lb

## 2020-08-26 DIAGNOSIS — C9200 Acute myeloblastic leukemia, not having achieved remission: Secondary | ICD-10-CM

## 2020-08-26 DIAGNOSIS — Z5111 Encounter for antineoplastic chemotherapy: Secondary | ICD-10-CM | POA: Diagnosis not present

## 2020-08-26 LAB — CBC WITH DIFFERENTIAL/PLATELET
Abs Immature Granulocytes: 0 10*3/uL (ref 0.00–0.07)
Basophils Absolute: 0 10*3/uL (ref 0.0–0.1)
Basophils Relative: 0 %
Eosinophils Absolute: 0 10*3/uL (ref 0.0–0.5)
Eosinophils Relative: 1 %
HCT: 39.2 % (ref 36.0–46.0)
Hemoglobin: 12.8 g/dL (ref 12.0–15.0)
Immature Granulocytes: 0 %
Lymphocytes Relative: 43 %
Lymphs Abs: 0.5 10*3/uL — ABNORMAL LOW (ref 0.7–4.0)
MCH: 27.8 pg (ref 26.0–34.0)
MCHC: 32.7 g/dL (ref 30.0–36.0)
MCV: 85.2 fL (ref 80.0–100.0)
Monocytes Absolute: 0.1 10*3/uL (ref 0.1–1.0)
Monocytes Relative: 8 %
Neutro Abs: 0.6 10*3/uL — ABNORMAL LOW (ref 1.7–7.7)
Neutrophils Relative %: 48 %
Platelets: 227 10*3/uL (ref 150–400)
RBC: 4.6 MIL/uL (ref 3.87–5.11)
RDW: 13.7 % (ref 11.5–15.5)
WBC: 1.2 10*3/uL — CL (ref 4.0–10.5)
nRBC: 0 % (ref 0.0–0.2)

## 2020-08-26 LAB — COMPREHENSIVE METABOLIC PANEL
ALT: 29 U/L (ref 0–44)
AST: 20 U/L (ref 15–41)
Albumin: 3.6 g/dL (ref 3.5–5.0)
Alkaline Phosphatase: 112 U/L (ref 38–126)
Anion gap: 11 (ref 5–15)
BUN: 20 mg/dL (ref 8–23)
CO2: 25 mmol/L (ref 22–32)
Calcium: 9.2 mg/dL (ref 8.9–10.3)
Chloride: 106 mmol/L (ref 98–111)
Creatinine, Ser: 0.92 mg/dL (ref 0.44–1.00)
GFR, Estimated: >60 mL/min (ref >60)
Glucose, Bld: 99 mg/dL (ref 70–99)
Potassium: 4.3 mmol/L (ref 3.5–5.1)
Sodium: 142 mmol/L (ref 135–145)
Total Bilirubin: 0.5 mg/dL (ref 0.3–1.2)
Total Protein: 6.6 g/dL (ref 6.5–8.1)

## 2020-08-26 LAB — SAMPLE TO BLOOD BANK

## 2020-08-26 MED ORDER — ONDANSETRON HCL 4 MG PO TABS
8.0000 mg | ORAL_TABLET | Freq: Once | ORAL | Status: AC
Start: 2020-08-26 — End: 2020-08-26
  Administered 2020-08-26: 8 mg via ORAL
  Filled 2020-08-26: qty 2

## 2020-08-26 MED ORDER — AZACITIDINE CHEMO SQ INJECTION
50.0000 mg/m2 | Freq: Once | INTRAMUSCULAR | Status: AC
  Administered 2020-08-26: 87.5 mg via SUBCUTANEOUS
  Filled 2020-08-26: qty 3.5

## 2020-08-26 NOTE — Patient Instructions (Signed)
[facility]    Discharge Instructions: Thank you for choosing [facility] to provide your oncology and hematology care.  If you have a lab appointment with the [facility], please go directly to the [facility] and check in at the registration area.  Wear comfortable clothing and clothing appropriate for easy access to any Portacath or PICC line.   We strive to give you quality time with your provider. You may need to reschedule your appointment if you arrive late (15 or more minutes).  Arriving late affects you and other patients whose appointments are after yours.  Also, if you miss three or more appointments without notifying the office, you may be dismissed from the clinic at the provider's discretion.      For prescription refill requests, have your pharmacy contact our office and allow 72 hours for refills to be completed.    Today you received the following chemotherapy and/or immunotherapy agents - vidaza      To help prevent nausea and vomiting after your treatment, we encourage you to take your nausea medication as directed.  BELOW ARE SYMPTOMS THAT SHOULD BE REPORTED IMMEDIATELY: . *FEVER GREATER THAN 100.4 F (38 C) OR HIGHER . *CHILLS OR SWEATING . *NAUSEA AND VOMITING THAT IS NOT CONTROLLED WITH YOUR NAUSEA MEDICATION . *UNUSUAL SHORTNESS OF BREATH . *UNUSUAL BRUISING OR BLEEDING . *URINARY PROBLEMS (pain or burning when urinating, or frequent urination) . *BOWEL PROBLEMS (unusual diarrhea, constipation, pain near the anus) . TENDERNESS IN MOUTH AND THROAT WITH OR WITHOUT PRESENCE OF ULCERS (sore throat, sores in mouth, or a toothache) . UNUSUAL RASH, SWELLING OR PAIN  . UNUSUAL VAGINAL DISCHARGE OR ITCHING   Items with * indicate a potential emergency and should be followed up as soon as possible or go to the Emergency Department if any problems should occur.  Please show the CHEMOTHERAPY ALERT CARD or IMMUNOTHERAPY  ALERT CARD at check-in to the Emergency Department and triage nurse.  Should you have questions after your visit or need to cancel or reschedule your appointment, please contact [facility]  [phone] and follow the prompts.  Office hours are 8:00 a.m. to 4:30 p.m. Monday - Friday. Please note that voicemails left after 4:00 p.m. may not be returned until the following business day.  We are closed weekends and major holidays. You have access to a nurse at all times for urgent questions. Please call the main number to the clinic [phone] and follow the prompts.  For any non-urgent questions, you may also contact your provider using MyChart. We now offer e-Visits for anyone 35 and older to request care online for non-urgent symptoms. For details visit [email or web address].   Also download the MyChart app! Go to the app store, search "MyChart", open the app, select [facility], and log in with your MyChart username and password.  Due to Covid, a mask is required upon entering the hospital/clinic. If you do not have a mask, one will be given to you upon arrival. For doctor visits, patients may have 1 support person aged 53 or older with them. For treatment visits, patients cannot have anyone with them due to current Covid guidelines and our immunocompromised population.      Azacitidine suspension for injection (subcutaneous use) What is this medicine? AZACITIDINE (ay [place]) is a chemotherapy drug. This medicine reduces the growth of cancer cells and can suppress the immune system. It is used for treating  myelodysplastic syndrome or some types of leukemia. This medicine may be used for other purposes; ask your health care provider or pharmacist if you have questions. COMMON BRAND NAME(S): Vidaza What should I tell my health care provider before I take this medicine? They need to know if you have any of these conditions:  kidney disease  liver  disease  liver tumors  an unusual or allergic reaction to azacitidine, mannitol, other medicines, foods, dyes, or preservatives  pregnant or trying to get pregnant  breast-feeding How should I use this medicine? This medicine is for injection under the skin. It is administered in a hospital or clinic by a specially trained health care professional. Talk to your pediatrician regarding the use of this medicine in children. While this drug may be prescribed for selected conditions, precautions do apply. Overdosage: If you think you have taken too much of this medicine contact a poison control center or emergency room at once. NOTE: This medicine is only for you. Do not share this medicine with others. What if I miss a dose? It is important not to miss your dose. Call your doctor or health care professional if you are unable to keep an appointment. What may interact with this medicine? Interactions have not been studied. Give your health care provider a list of all the medicines, herbs, non-prescription drugs, or dietary supplements you use. Also tell them if you smoke, drink alcohol, or use illegal drugs. Some items may interact with your medicine. This list may not describe all possible interactions. Give your health care provider a list of all the medicines, herbs, non-prescription drugs, or dietary supplements you use. Also tell them if you smoke, drink alcohol, or use illegal drugs. Some items may interact with your medicine. What should I watch for while using this medicine? Visit your doctor for checks on your progress. This drug may make you feel generally unwell. This is not uncommon, as chemotherapy can affect healthy cells as well as cancer cells. Report any side effects. Continue your course of treatment even though you feel ill unless your doctor tells you to stop. In some cases, you may be given additional medicines to help with side effects. Follow all directions for their use. Call  your doctor or health care professional for advice if you get a fever, chills or sore throat, or other symptoms of a cold or flu. Do not treat yourself. This drug decreases your body's ability to fight infections. Try to avoid being around people who are sick. This medicine may increase your risk to bruise or bleed. Call your doctor or health care professional if you notice any unusual bleeding. You may need blood work done while you are taking this medicine. Do not become pregnant while taking this medicine and for 6 months after the last dose. Women should inform their doctor if they wish to become pregnant or think they might be pregnant. Men should not father a child while taking this medicine and for 3 months after the last dose. There is a potential for serious side effects to an unborn child. Talk to your health care professional or pharmacist for more information. Do not breast-feed an infant while taking this medicine and for 1 week after the last dose. This medicine may interfere with the ability to have a child. Talk with your doctor or health care professional if you are concerned about your fertility. What side effects may I notice from receiving this medicine? Side effects that you should report to  your doctor or health care professional as soon as possible:  allergic reactions like skin rash, itching or hives, swelling of the face, lips, or tongue  low blood counts - this medicine may decrease the number of  blood cells, red blood cells and platelets. You may be at increased risk for infections and bleeding.  signs of infection - fever or chills, cough, sore throat, pain passing urine  signs of decreased platelets or bleeding - bruising, pinpoint red spots on the skin, black, tarry stools, blood in the urine  signs of decreased red blood cells - unusually weak or tired, fainting spells, lightheadedness  signs and symptoms of kidney injury like trouble passing urine or change in  the amount of urine  signs and symptoms of liver injury like dark yellow or brown urine; general ill feeling or flu-like symptoms; light-colored stools; loss of appetite; nausea; right upper belly pain; unusually weak or tired; yellowing of the eyes or skin Side effects that usually do not require medical attention (report to your doctor or health care professional if they continue or are bothersome):  constipation  diarrhea  nausea, vomiting  pain or redness at the injection site  unusually weak or tired This list may not describe all possible side effects. Call your doctor for medical advice about side effects. You may report side effects to FDA at 1-800-FDA-1088. Where should I keep my medicine? This drug is given in a hospital or clinic and will not be stored at home. NOTE: This sheet is a summary. It may not cover all possible information. If you have questions about this medicine, talk to your doctor, pharmacist, or health care provider.  2021 Elsevier/Gold Standard (2016-05-19 14:37:51)      Ondansetron tablets What is this medicine? ONDANSETRON (on DAN se tron) is used to treat nausea and vomiting caused by chemotherapy. It is also used to prevent or treat nausea and vomiting after surgery. This medicine may be used for other purposes; ask your health care provider or pharmacist if you have questions. COMMON BRAND NAME(S): Zofran What should I tell my health care provider before I take this medicine? They need to know if you have any of these conditions:  heart disease  history of irregular heartbeat  liver disease  low levels of magnesium or potassium in the blood  an unusual or allergic reaction to ondansetron, granisetron, other medicines, foods, dyes, or preservatives  pregnant or trying to get pregnant  breast-feeding How should I use this medicine? Take this medicine by mouth with a glass of water. Follow the directions on your prescription label. Take  your doses at regular intervals. Do not take your medicine more often than directed. Talk to your pediatrician regarding the use of this medicine in children. Special care may be needed. Overdosage: If you think you have taken too much of this medicine contact a poison control center or emergency room at once. NOTE: This medicine is only for you. Do not share this medicine with others. What if I miss a dose? If you miss a dose, take it as soon as you can. If it is almost time for your next dose, take only that dose. Do not take double or extra doses. What may interact with this medicine? Do not take this medicine with any of the following medications:  apomorphine  certain medicines for fungal infections like fluconazole, itraconazole, ketoconazole, posaconazole, voriconazole  cisapride  dronedarone  pimozide  thioridazine This medicine may also interact with the following  medications:  carbamazepine  certain medicines for depression, anxiety, or psychotic disturbances  fentanyl  linezolid  MAOIs like Carbex, Eldepryl, Marplan, Nardil, and Parnate  methylene blue (injected into a vein)  other medicines that prolong the QT interval (cause an abnormal heart rhythm) like dofetilide, ziprasidone  phenytoin  rifampicin  tramadol This list may not describe all possible interactions. Give your health care provider a list of all the medicines, herbs, non-prescription drugs, or dietary supplements you use. Also tell them if you smoke, drink alcohol, or use illegal drugs. Some items may interact with your medicine. What should I watch for while using this medicine? Check with your doctor or health care professional right away if you have any sign of an allergic reaction. What side effects may I notice from receiving this medicine? Side effects that you should report to your doctor or health care professional as soon as possible:  allergic reactions like skin rash, itching or  hives, swelling of the face, lips or tongue  breathing problems  confusion  dizziness  fast or irregular heartbeat  feeling faint or lightheaded, falls  fever and chills  loss of balance or coordination  seizures  sweating  swelling of the hands or feet  tightness in the chest  tremors  unusually weak or tired Side effects that usually do not require medical attention (report to your doctor or health care professional if they continue or are bothersome):  constipation or diarrhea  headache This list may not describe all possible side effects. Call your doctor for medical advice about side effects. You may report side effects to FDA at 1-800-FDA-1088. Where should I keep my medicine? Keep out of the reach of children. Store between 2 and 30 degrees C (36 and 86 degrees F). Throw away any unused medicine after the expiration date. NOTE: This sheet is a summary. It may not cover all possible information. If you have questions about this medicine, talk to your doctor, pharmacist, or health care provider.  2021 Elsevier/Gold Standard (2018-04-12 07:16:43)

## 2020-08-26 NOTE — Progress Notes (Signed)
Pt received vidaza injection in clinic today. Tolerated well. No complaints at d/c.

## 2020-08-27 ENCOUNTER — Other Ambulatory Visit: Payer: Self-pay

## 2020-08-27 ENCOUNTER — Inpatient Hospital Stay (HOSPITAL_BASED_OUTPATIENT_CLINIC_OR_DEPARTMENT_OTHER): Payer: PPO | Admitting: Oncology

## 2020-08-27 ENCOUNTER — Inpatient Hospital Stay: Payer: PPO

## 2020-08-27 ENCOUNTER — Encounter: Payer: Self-pay | Admitting: Oncology

## 2020-08-27 VITALS — BP 102/69 | HR 92 | Temp 97.4°F | Resp 17 | Ht 64.0 in | Wt 127.0 lb

## 2020-08-27 DIAGNOSIS — R634 Abnormal weight loss: Secondary | ICD-10-CM | POA: Diagnosis not present

## 2020-08-27 DIAGNOSIS — C9200 Acute myeloblastic leukemia, not having achieved remission: Secondary | ICD-10-CM | POA: Diagnosis not present

## 2020-08-27 DIAGNOSIS — Z5111 Encounter for antineoplastic chemotherapy: Secondary | ICD-10-CM | POA: Diagnosis not present

## 2020-08-27 MED ORDER — AZACITIDINE CHEMO SQ INJECTION
50.0000 mg/m2 | Freq: Once | INTRAMUSCULAR | Status: AC
  Administered 2020-08-27: 87.5 mg via SUBCUTANEOUS
  Filled 2020-08-27: qty 3.5

## 2020-08-27 MED ORDER — ONDANSETRON HCL 4 MG PO TABS
8.0000 mg | ORAL_TABLET | Freq: Once | ORAL | Status: AC
Start: 2020-08-27 — End: 2020-08-27
  Administered 2020-08-27: 8 mg via ORAL
  Filled 2020-08-27: qty 2

## 2020-08-27 NOTE — Patient Instructions (Signed)
Azacitidine suspension for injection (subcutaneous use) What is this medicine? AZACITIDINE (ay za SITE i deen) is a chemotherapy drug. This medicine reduces the growth of cancer cells and can suppress the immune system. It is used for treating myelodysplastic syndrome or some types of leukemia. This medicine may be used for other purposes; ask your health care provider or pharmacist if you have questions. COMMON BRAND NAME(S): Vidaza What should I tell my health care provider before I take this medicine? They need to know if you have any of these conditions:  kidney disease  liver disease  liver tumors  an unusual or allergic reaction to azacitidine, mannitol, other medicines, foods, dyes, or preservatives  pregnant or trying to get pregnant  breast-feeding How should I use this medicine? This medicine is for injection under the skin. It is administered in a hospital or clinic by a specially trained health care professional. Talk to your pediatrician regarding the use of this medicine in children. While this drug may be prescribed for selected conditions, precautions do apply. Overdosage: If you think you have taken too much of this medicine contact a poison control center or emergency room at once. NOTE: This medicine is only for you. Do not share this medicine with others. What if I miss a dose? It is important not to miss your dose. Call your doctor or health care professional if you are unable to keep an appointment. What may interact with this medicine? Interactions have not been studied. Give your health care provider a list of all the medicines, herbs, non-prescription drugs, or dietary supplements you use. Also tell them if you smoke, drink alcohol, or use illegal drugs. Some items may interact with your medicine. This list may not describe all possible interactions. Give your health care provider a list of all the medicines, herbs, non-prescription drugs, or dietary supplements  you use. Also tell them if you smoke, drink alcohol, or use illegal drugs. Some items may interact with your medicine. What should I watch for while using this medicine? Visit your doctor for checks on your progress. This drug may make you feel generally unwell. This is not uncommon, as chemotherapy can affect healthy cells as well as cancer cells. Report any side effects. Continue your course of treatment even though you feel ill unless your doctor tells you to stop. In some cases, you may be given additional medicines to help with side effects. Follow all directions for their use. Call your doctor or health care professional for advice if you get a fever, chills or sore throat, or other symptoms of a cold or flu. Do not treat yourself. This drug decreases your body's ability to fight infections. Try to avoid being around people who are sick. This medicine may increase your risk to bruise or bleed. Call your doctor or health care professional if you notice any unusual bleeding. You may need blood work done while you are taking this medicine. Do not become pregnant while taking this medicine and for 6 months after the last dose. Women should inform their doctor if they wish to become pregnant or think they might be pregnant. Men should not father a child while taking this medicine and for 3 months after the last dose. There is a potential for serious side effects to an unborn child. Talk to your health care professional or pharmacist for more information. Do not breast-feed an infant while taking this medicine and for 1 week after the last dose. This medicine may interfere with   the ability to have a child. Talk with your doctor or health care professional if you are concerned about your fertility. What side effects may I notice from receiving this medicine? Side effects that you should report to your doctor or health care professional as soon as possible:  allergic reactions like skin rash, itching or  hives, swelling of the face, lips, or tongue  low blood counts - this medicine may decrease the number of white blood cells, red blood cells and platelets. You may be at increased risk for infections and bleeding.  signs of infection - fever or chills, cough, sore throat, pain passing urine  signs of decreased platelets or bleeding - bruising, pinpoint red spots on the skin, black, tarry stools, blood in the urine  signs of decreased red blood cells - unusually weak or tired, fainting spells, lightheadedness  signs and symptoms of kidney injury like trouble passing urine or change in the amount of urine  signs and symptoms of liver injury like dark yellow or brown urine; general ill feeling or flu-like symptoms; light-colored stools; loss of appetite; nausea; right upper belly pain; unusually weak or tired; yellowing of the eyes or skin Side effects that usually do not require medical attention (report to your doctor or health care professional if they continue or are bothersome):  constipation  diarrhea  nausea, vomiting  pain or redness at the injection site  unusually weak or tired This list may not describe all possible side effects. Call your doctor for medical advice about side effects. You may report side effects to FDA at 1-800-FDA-1088. Where should I keep my medicine? This drug is given in a hospital or clinic and will not be stored at home. NOTE: This sheet is a summary. It may not cover all possible information. If you have questions about this medicine, talk to your doctor, pharmacist, or health care provider.  2021 Elsevier/Gold Standard (2016-05-19 14:37:51)  

## 2020-08-27 NOTE — Progress Notes (Signed)
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 Currently treatment with azacitidine, venetoclax on hold.  Reason for Consult: Medication adherence visit. Patient is not currently on oral chemotherapy, today's appt is to assist with maintence medication adherence  MEDICATIONS:  Current Outpatient Medications  Medication Sig Dispense Refill  . clonazePAM (KLONOPIN) 0.5 MG tablet Take 1.5 mg by mouth 2 (two) times daily.   0  . diphenoxylate-atropine (LOMOTIL) 2.5-0.025 MG tablet Take 1 tablet by mouth 4 (four) times daily as needed for diarrhea or loose stools. 120 tablet 0  . escitalopram (LEXAPRO) 20 MG tablet Take 20 mg by mouth every morning.     Marland Kitchen levothyroxine (SYNTHROID) 125 MCG tablet TAKE 1 TABLET BY MOUTH EVERY DAY 90 tablet 1  . magnesium oxide (MAG-OX) 400 MG tablet Take 400 mg by mouth daily.    . metoprolol tartrate (LOPRESSOR) 25 MG tablet Take 1 tablet (25 mg total) by mouth as needed (As needed up to twice daily for palpitations). 60 tablet 1  . pantoprazole (PROTONIX) 40 MG tablet TAKE 1 TABLET BY MOUTH EVERY DAY 90 tablet 2  . potassium chloride SA (KLOR-CON) 20 MEQ tablet Take 1 tablet (20 mEq total) by mouth 2 (two) times daily. 21 tablet 0  . valACYclovir (VALTREX) 500 MG tablet Take 500 mg by mouth daily.     Current Facility-Administered Medications  Medication Dose Route Frequency Provider Last Rate Last Admin  . ondansetron (ZOFRAN) tablet 8 mg  8 mg Oral Once Guse, Jacquelynn Cree, FNP       Facility-Administered Medications Ordered in Other Visits  Medication Dose Route Frequency Provider Last Rate Last Admin  . potassium chloride (KLOR-CON) CR  tablet 40 mEq  40 mEq Oral BID Lloyd Huger, MD   40 mEq at 07/08/20 1136    VITAL SIGNS: There were no vitals taken for this visit. There were no vitals filed for this visit.  Estimated body mass index is 21.8 kg/m as calculated from the following:   Height as of 08/27/20: 5\' 4"  (1.626 m).   Weight as of 08/27/20: 57.6 kg (127 lb).  LABS: CBC:    Component Value Date/Time   WBC 1.2 (LL) 08/26/2020 1045   HGB 12.8 08/26/2020 1045   HGB 12.5 05/09/2018 1147   HCT 39.2 08/26/2020 1045   HCT 36.8 05/09/2018 1147   PLT 227 08/26/2020 1045   PLT 285 05/09/2018 1147   MCV 85.2 08/26/2020 1045   MCV 86 05/09/2018 1147   MCV 89 11/30/2013 1419   NEUTROABS 0.6 (L) 08/26/2020 1045   NEUTROABS 2.9 11/20/2013 1404   LYMPHSABS 0.5 (L) 08/26/2020 1045   LYMPHSABS 0.9 (L) 11/20/2013 1404   MONOABS 0.1 08/26/2020 1045   MONOABS 0.2 11/20/2013 1404   EOSABS 0.0 08/26/2020 1045   EOSABS 0.1 11/20/2013 1404   BASOSABS 0.0 08/26/2020 1045   BASOSABS 0.0 11/20/2013 1404   Comprehensive Metabolic Panel:    Component Value Date/Time   NA 142 08/26/2020 1045   NA 143 05/09/2018 1147   NA 145 11/30/2013 1419   K 4.3 08/26/2020 1045   K 3.5 11/30/2013 1419   CL 106 08/26/2020 1045   CL  109 (H) 11/30/2013 1419   CO2 25 08/26/2020 1045   CO2 29 11/30/2013 1419   BUN 20 08/26/2020 1045   BUN 11 05/09/2018 1147   BUN 15 11/30/2013 1419   CREATININE 0.92 08/26/2020 1045   CREATININE 1.04 11/30/2013 1419   GLUCOSE 99 08/26/2020 1045   GLUCOSE 97 11/30/2013 1419   CALCIUM 9.2 08/26/2020 1045   CALCIUM 8.7 11/30/2013 1419   AST 20 08/26/2020 1045   AST 28 11/30/2013 1419   ALT 29 08/26/2020 1045   ALT 71 (H) 11/30/2013 1419   ALKPHOS 112 08/26/2020 1045   ALKPHOS 114 11/30/2013 1419   BILITOT 0.5 08/26/2020 1045   BILITOT 0.7 11/30/2013 1419   PROT 6.6 08/26/2020 1045   PROT 7.1 11/30/2013 1419   ALBUMIN 3.6 08/26/2020 1045   ALBUMIN 3.3 (L) 11/30/2013 1419     Assessment  and Plan-  Ms. Camplin did not have enough of her maintenance medications to fill her medication trays today. She does have one more week tray onhand now.   Dr. [name] would like to hold her magnesium and potassium to now and recheck her labs next month to see if both medications are still needed.   She will need more of the clonazepam and levothyroxine (30 day supply) to fill additional medication trays. I called her local [facility] and it is too early to fill the clonazepam but it can be filled on 09/02/20. Ms. Kaffenberger will pick up both medication, clonazepam and levothyroxine, and return to clinic on 09/03/20 for a medication adherence visit.  He will takeover prescribing her valacyclovir. She will need her escitalopram, pantoprazole, and levothyroxine transferred [facility] to [facility]. When time a prescription for clonazepam will need to be sent directly to [facility].  The goal is to get her switched to [facility] so they can package her medications monthly. She recently filled 90 days supplies of some of her medication so it will take some coordination to get this done.  All medication and bottles were returned to Ms. Showman  Follow-up: next week on 09/03/20, will fill additional trays at that time   Patient expressed understanding and was in agreement with this plan. She also understands that She can call clinic at any time with any questions, concerns, or complaints.   Thank you for allowing me to participate in the care of this very pleasant patient.   Time Total: 30 mins  Visit consisted of counseling and education on dealing with issues of symptom management in the setting of serious and potentially life-threatening illness.Greater than 50%  of this time was spent counseling and coordinating care related to the above assessment and plan.  Signed by: Darl Pikes, PharmD, BCPS, Salley Slaughter, CPP Hematology/Oncology Clinical Pharmacist Practitioner [facility] [phone]  08/27/2020 11:44 AM

## 2020-08-28 ENCOUNTER — Other Ambulatory Visit: Payer: Self-pay

## 2020-08-28 ENCOUNTER — Inpatient Hospital Stay: Payer: PPO

## 2020-08-28 VITALS — BP 105/66 | HR 83 | Temp 96.9°F | Resp 16

## 2020-08-28 DIAGNOSIS — C9200 Acute myeloblastic leukemia, not having achieved remission: Secondary | ICD-10-CM

## 2020-08-28 DIAGNOSIS — Z5111 Encounter for antineoplastic chemotherapy: Secondary | ICD-10-CM | POA: Diagnosis not present

## 2020-08-28 MED ORDER — AZACITIDINE CHEMO SQ INJECTION
50.0000 mg/m2 | Freq: Once | INTRAMUSCULAR | Status: AC
  Administered 2020-08-28: 87.5 mg via SUBCUTANEOUS
  Filled 2020-08-28: qty 3.5

## 2020-08-28 MED ORDER — ONDANSETRON HCL 4 MG PO TABS
8.0000 mg | ORAL_TABLET | Freq: Once | ORAL | Status: AC
  Administered 2020-08-28: 8 mg via ORAL
  Filled 2020-08-28: qty 2

## 2020-08-28 NOTE — Patient Instructions (Signed)
[facility]   Discharge Instructions: Thank you for choosing [facility] to provide your oncology and hematology care.  If you have a lab appointment with the Cancer Center, please go directly to the Cancer Center and check in at the registration area.  Wear comfortable clothing and clothing appropriate for easy access to any Portacath or PICC line.   We strive to give you quality time with your provider. You may need to reschedule your appointment if you arrive late (15 or more minutes).  Arriving late affects you and other patients whose appointments are after yours.  Also, if you miss three or more appointments without notifying the office, you may be dismissed from the clinic at the provider's discretion.      For prescription refill requests, have your pharmacy contact our office and allow 72 hours for refills to be completed.    Today you received the following chemotherapy and/or immunotherapy agents Vidaza  Azacitidine suspension for injection (subcutaneous use) What is this medicine? AZACITIDINE (ay za SITE i deen) is a chemotherapy drug. This medicine reduces the growth of cancer cells and can suppress the immune system. It is used for treating myelodysplastic syndrome or some types of leukemia. This medicine may be used for other purposes; ask your health care provider or pharmacist if you have questions. COMMON BRAND NAME(S): Vidaza What should I tell my health care provider before I take this medicine? They need to know if you have any of these conditions:  kidney disease  liver disease  liver tumors  an unusual or allergic reaction to azacitidine, mannitol, other medicines, foods, dyes, or preservatives  pregnant or trying to get pregnant  breast-feeding How should I use this medicine? This medicine is for injection under the skin. It is administered in a hospital or clinic by a specially trained health care  professional. Talk to your pediatrician regarding the use of this medicine in children. While this drug may be prescribed for selected conditions, precautions do apply. Overdosage: If you think you have taken too much of this medicine contact a poison control center or emergency room at once. NOTE: This medicine is only for you. Do not share this medicine with others. What if I miss a dose? It is important not to miss your dose. Call your doctor or health care professional if you are unable to keep an appointment. What may interact with this medicine? Interactions have not been studied. Give your health care provider a list of all the medicines, herbs, non-prescription drugs, or dietary supplements you use. Also tell them if you smoke, drink alcohol, or use illegal drugs. Some items may interact with your medicine. This list may not describe all possible interactions. Give your health care provider a list of all the medicines, herbs, non-prescription drugs, or dietary supplements you use. Also tell them if you smoke, drink alcohol, or use illegal drugs. Some items may interact with your medicine. What should I watch for while using this medicine? Visit your doctor for checks on your progress. This drug may make you feel generally unwell. This is not uncommon, as chemotherapy can affect healthy cells as well as cancer cells. Report any side effects. Continue your course of treatment even though you feel ill unless your doctor tells you to stop. In some cases, you may be given additional medicines to help with side effects. Follow all directions for their use. Call your doctor or health care professional for advice if you   get a fever, chills or sore throat, or other symptoms of a cold or flu. Do not treat yourself. This drug decreases your body's ability to fight infections. Try to avoid being around people who are sick. This medicine may increase your risk to bruise or bleed. Call your doctor or health  care professional if you notice any unusual bleeding. You may need blood work done while you are taking this medicine. Do not become pregnant while taking this medicine and for 6 months after the last dose. Women should inform their doctor if they wish to become pregnant or think they might be pregnant. Men should not father a child while taking this medicine and for 3 months after the last dose. There is a potential for serious side effects to an unborn child. Talk to your health care professional or pharmacist for more information. Do not breast-feed an infant while taking this medicine and for 1 week after the last dose. This medicine may interfere with the ability to have a child. Talk with your doctor or health care professional if you are concerned about your fertility. What side effects may I notice from receiving this medicine? Side effects that you should report to your doctor or health care professional as soon as possible:  allergic reactions like skin rash, itching or hives, swelling of the face, lips, or tongue  low blood counts - this medicine may decrease the number of white blood cells, red blood cells and platelets. You may be at increased risk for infections and bleeding.  signs of infection - fever or chills, cough, sore throat, pain passing urine  signs of decreased platelets or bleeding - bruising, pinpoint red spots on the skin, black, tarry stools, blood in the urine  signs of decreased red blood cells - unusually weak or tired, fainting spells, lightheadedness  signs and symptoms of kidney injury like trouble passing urine or change in the amount of urine  signs and symptoms of liver injury like dark yellow or brown urine; general ill feeling or flu-like symptoms; light-colored stools; loss of appetite; nausea; right upper belly pain; unusually weak or tired; yellowing of the eyes or skin Side effects that usually do not require medical attention (report to your doctor or  health care professional if they continue or are bothersome):  constipation  diarrhea  nausea, vomiting  pain or redness at the injection site  unusually weak or tired This list may not describe all possible side effects. Call your doctor for medical advice about side effects. You may report side effects to FDA at 1-800-FDA-1088. Where should I keep my medicine? This drug is given in a hospital or clinic and will not be stored at home. NOTE: This sheet is a summary. It may not cover all possible information. If you have questions about this medicine, talk to your doctor, pharmacist, or health care provider.  2021 Elsevier/Gold Standard (2016-05-19 14:37:51)      To help prevent nausea and vomiting after your treatment, we encourage you to take your nausea medication as directed.  BELOW ARE SYMPTOMS THAT SHOULD BE REPORTED IMMEDIATELY: . *FEVER GREATER THAN 100.4 F (38 C) OR HIGHER . *CHILLS OR SWEATING . *NAUSEA AND VOMITING THAT IS NOT CONTROLLED WITH YOUR NAUSEA MEDICATION . *UNUSUAL SHORTNESS OF BREATH . *UNUSUAL BRUISING OR BLEEDING . *URINARY PROBLEMS (pain or burning when urinating, or frequent urination) . *BOWEL PROBLEMS (unusual diarrhea, constipation, pain near the anus) . TENDERNESS IN MOUTH AND THROAT WITH OR WITHOUT PRESENCE OF   ULCERS (sore throat, sores in mouth, or a toothache) . UNUSUAL RASH, SWELLING OR PAIN  . UNUSUAL VAGINAL DISCHARGE OR ITCHING   Items with * indicate a potential emergency and should be followed up as soon as possible or go to the Emergency Department if any problems should occur.  Please show the CHEMOTHERAPY ALERT CARD or IMMUNOTHERAPY ALERT CARD at check-in to the Emergency Department and triage nurse.  Should you have questions after your visit or need to cancel or reschedule your appointment, please contact [facility]  [phone] and follow the prompts.  Office hours are 8:00 a.m. to 4:30 p.m.  Monday - Friday. Please note that voicemails left after 4:00 p.m. may not be returned until the following business day.  We are closed weekends and major holidays. You have access to a nurse at all times for urgent questions. Please call the main number to the clinic [phone] and follow the prompts.  For any non-urgent questions, you may also contact your provider using MyChart. We now offer e-Visits for anyone 18 and older to request care online for non-urgent symptoms. For details visit [email or web address].   Also download the MyChart app! Go to the app store, search "MyChart", open the app, select , and log in with your MyChart username and password.  Due to Covid, a mask is required upon entering the hospital/clinic. If you do not have a mask, one will be given to you upon arrival. For doctor visits, patients may have 1 support person aged 18 or older with them. For treatment visits, patients cannot have anyone with them due to current Covid guidelines and our immunocompromised population.  

## 2020-08-29 ENCOUNTER — Other Ambulatory Visit: Payer: Self-pay

## 2020-08-29 ENCOUNTER — Inpatient Hospital Stay: Payer: PPO

## 2020-08-29 VITALS — BP 121/62 | HR 89 | Temp 96.0°F | Resp 18

## 2020-08-29 DIAGNOSIS — Z5111 Encounter for antineoplastic chemotherapy: Secondary | ICD-10-CM | POA: Diagnosis not present

## 2020-08-29 DIAGNOSIS — C9200 Acute myeloblastic leukemia, not having achieved remission: Secondary | ICD-10-CM

## 2020-08-29 MED ORDER — ONDANSETRON HCL 4 MG PO TABS
8.0000 mg | ORAL_TABLET | Freq: Once | ORAL | Status: AC
  Administered 2020-08-29: 8 mg via ORAL
  Filled 2020-08-29: qty 2

## 2020-08-29 MED ORDER — AZACITIDINE CHEMO SQ INJECTION
50.0000 mg/m2 | Freq: Once | INTRAMUSCULAR | Status: AC
  Administered 2020-08-29: 87.5 mg via SUBCUTANEOUS
  Filled 2020-08-29: qty 3.5

## 2020-08-29 NOTE — Progress Notes (Signed)
Pt received vidaza injection in clinic today. Tolerated well. 

## 2020-08-29 NOTE — Patient Instructions (Signed)
[facility]   Discharge Instructions: Thank you for choosing [facility] to provide your oncology and hematology care.  If you have a lab appointment with the [facility], please go directly to the [facility] and check in at the registration area.  Wear comfortable clothing and clothing appropriate for easy access to any Portacath or PICC line.   We strive to give you quality time with your provider. You may need to reschedule your appointment if you arrive late (15 or more minutes).  Arriving late affects you and other patients whose appointments are after yours.  Also, if you miss three or more appointments without notifying the office, you may be dismissed from the clinic at the provider's discretion.      For prescription refill requests, have your pharmacy contact our office and allow 72 hours for refills to be completed.    Today you received the following chemotherapy and/or immunotherapy agents: vidaza      To help prevent nausea and vomiting after your treatment, we encourage you to take your nausea medication as directed.  BELOW ARE SYMPTOMS THAT SHOULD BE REPORTED IMMEDIATELY: . *FEVER GREATER THAN 100.4 F (38 C) OR HIGHER . *CHILLS OR SWEATING . *NAUSEA AND VOMITING THAT IS NOT CONTROLLED WITH YOUR NAUSEA MEDICATION . *UNUSUAL SHORTNESS OF BREATH . *UNUSUAL BRUISING OR BLEEDING . *URINARY PROBLEMS (pain or burning when urinating, or frequent urination) . *BOWEL PROBLEMS (unusual diarrhea, constipation, pain near the anus) . TENDERNESS IN MOUTH AND THROAT WITH OR WITHOUT PRESENCE OF ULCERS (sore throat, sores in mouth, or a toothache) . UNUSUAL RASH, SWELLING OR PAIN  . UNUSUAL VAGINAL DISCHARGE OR ITCHING   Items with * indicate a potential emergency and should be followed up as soon as possible or go to the Emergency Department if any problems should occur.  Please show the CHEMOTHERAPY ALERT CARD or IMMUNOTHERAPY  ALERT CARD at check-in to the Emergency Department and triage nurse.  Should you have questions after your visit or need to cancel or reschedule your appointment, please contact [facility]  [phone] and follow the prompts.  Office hours are 8:00 a.m. to 4:30 p.m. Monday - Friday. Please note that voicemails left after 4:00 p.m. may not be returned until the following business day.  We are closed weekends and major holidays. You have access to a nurse at all times for urgent questions. Please call the main number to the clinic [phone] and follow the prompts.  For any non-urgent questions, you may also contact your provider using MyChart. We now offer e-Visits for anyone 108 and older to request care online for non-urgent symptoms. For details visit [email or web address].   Also download the MyChart app! Go to the app store, search "MyChart", open the app, select [facility], and log in with your MyChart username and password.  Due to Covid, a mask is required upon entering the hospital/clinic. If you do not have a mask, one will be given to you upon arrival. For doctor visits, patients may have 1 support person aged 55 or older with them. For treatment visits, patients cannot have anyone with them due to current Covid guidelines and our immunocompromised population.     Ondansetron tablets What is this medicine? ONDANSETRON (on DAN se tron) is used to treat nausea and vomiting caused by chemotherapy. It is also used to prevent or treat nausea and vomiting after surgery. This medicine may be used for other purposes;  ask your health care provider or pharmacist if you have questions. COMMON BRAND NAME(S): Zofran What should I tell my health care provider before I take this medicine? They need to know if you have any of these conditions:  heart disease  history of irregular heartbeat  liver disease  low levels of magnesium or potassium in the  blood  an unusual or allergic reaction to ondansetron, granisetron, other medicines, foods, dyes, or preservatives  pregnant or trying to get pregnant  breast-feeding How should I use this medicine? Take this medicine by mouth with a glass of water. Follow the directions on your prescription label. Take your doses at regular intervals. Do not take your medicine more often than directed. Talk to your pediatrician regarding the use of this medicine in children. Special care may be needed. Overdosage: If you think you have taken too much of this medicine contact a poison control center or emergency room at once. NOTE: This medicine is only for you. Do not share this medicine with others. What if I miss a dose? If you miss a dose, take it as soon as you can. If it is almost time for your next dose, take only that dose. Do not take double or extra doses. What may interact with this medicine? Do not take this medicine with any of the following medications:  apomorphine  certain medicines for fungal infections like fluconazole, itraconazole, ketoconazole, posaconazole, voriconazole  cisapride  dronedarone  pimozide  thioridazine This medicine may also interact with the following medications:  carbamazepine  certain medicines for depression, anxiety, or psychotic disturbances  fentanyl  linezolid  MAOIs like Carbex, Eldepryl, Marplan, Nardil, and Parnate  methylene blue (injected into a vein)  other medicines that prolong the QT interval (cause an abnormal heart rhythm) like dofetilide, ziprasidone  phenytoin  rifampicin  tramadol This list may not describe all possible interactions. Give your health care provider a list of all the medicines, herbs, non-prescription drugs, or dietary supplements you use. Also tell them if you smoke, drink alcohol, or use illegal drugs. Some items may interact with your medicine. What should I watch for while using this medicine? Check with  your doctor or health care professional right away if you have any sign of an allergic reaction. What side effects may I notice from receiving this medicine? Side effects that you should report to your doctor or health care professional as soon as possible:  allergic reactions like skin rash, itching or hives, swelling of the face, lips or tongue  breathing problems  confusion  dizziness  fast or irregular heartbeat  feeling faint or lightheaded, falls  fever and chills  loss of balance or coordination  seizures  sweating  swelling of the hands or feet  tightness in the chest  tremors  unusually weak or tired Side effects that usually do not require medical attention (report to your doctor or health care professional if they continue or are bothersome):  constipation or diarrhea  headache This list may not describe all possible side effects. Call your doctor for medical advice about side effects. You may report side effects to FDA at 1-800-FDA-1088. Where should I keep my medicine? Keep out of the reach of children. Store between 2 and 30 degrees C (36 and 86 degrees F). Throw away any unused medicine after the expiration date. NOTE: This sheet is a summary. It may not cover all possible information. If you have questions about this medicine, talk to your doctor, pharmacist, or  health care provider.  2021 Elsevier/Gold Standard (2018-04-12 07:16:43)     Azacitidine suspension for injection (subcutaneous use) What is this medicine? AZACITIDINE (ay [place]) is a chemotherapy drug. This medicine reduces the growth of cancer cells and can suppress the immune system. It is used for treating myelodysplastic syndrome or some types of leukemia. This medicine may be used for other purposes; ask your health care provider or pharmacist if you have questions. COMMON BRAND NAME(S): Vidaza What should I tell my health care provider before I take this medicine? They need  to know if you have any of these conditions:  kidney disease  liver disease  liver tumors  an unusual or allergic reaction to azacitidine, mannitol, other medicines, foods, dyes, or preservatives  pregnant or trying to get pregnant  breast-feeding How should I use this medicine? This medicine is for injection under the skin. It is administered in a hospital or clinic by a specially trained health care professional. Talk to your pediatrician regarding the use of this medicine in children. While this drug may be prescribed for selected conditions, precautions do apply. Overdosage: If you think you have taken too much of this medicine contact a poison control center or emergency room at once. NOTE: This medicine is only for you. Do not share this medicine with others. What if I miss a dose? It is important not to miss your dose. Call your doctor or health care professional if you are unable to keep an appointment. What may interact with this medicine? Interactions have not been studied. Give your health care provider a list of all the medicines, herbs, non-prescription drugs, or dietary supplements you use. Also tell them if you smoke, drink alcohol, or use illegal drugs. Some items may interact with your medicine. This list may not describe all possible interactions. Give your health care provider a list of all the medicines, herbs, non-prescription drugs, or dietary supplements you use. Also tell them if you smoke, drink alcohol, or use illegal drugs. Some items may interact with your medicine. What should I watch for while using this medicine? Visit your doctor for checks on your progress. This drug may make you feel generally unwell. This is not uncommon, as chemotherapy can affect healthy cells as well as cancer cells. Report any side effects. Continue your course of treatment even though you feel ill unless your doctor tells you to stop. In some cases, you may be given additional  medicines to help with side effects. Follow all directions for their use. Call your doctor or health care professional for advice if you get a fever, chills or sore throat, or other symptoms of a cold or flu. Do not treat yourself. This drug decreases your body's ability to fight infections. Try to avoid being around people who are sick. This medicine may increase your risk to bruise or bleed. Call your doctor or health care professional if you notice any unusual bleeding. You may need blood work done while you are taking this medicine. Do not become pregnant while taking this medicine and for 6 months after the last dose. Women should inform their doctor if they wish to become pregnant or think they might be pregnant. Men should not father a child while taking this medicine and for 3 months after the last dose. There is a potential for serious side effects to an unborn child. Talk to your health care professional or pharmacist for more information. Do not breast-feed an infant while taking this  medicine and for 1 week after the last dose. This medicine may interfere with the ability to have a child. Talk with your doctor or health care professional if you are concerned about your fertility. What side effects may I notice from receiving this medicine? Side effects that you should report to your doctor or health care professional as soon as possible:  allergic reactions like skin rash, itching or hives, swelling of the face, lips, or tongue  low blood counts - this medicine may decrease the number of  blood cells, red blood cells and platelets. You may be at increased risk for infections and bleeding.  signs of infection - fever or chills, cough, sore throat, pain passing urine  signs of decreased platelets or bleeding - bruising, pinpoint red spots on the skin, black, tarry stools, blood in the urine  signs of decreased red blood cells - unusually weak or tired, fainting spells,  lightheadedness  signs and symptoms of kidney injury like trouble passing urine or change in the amount of urine  signs and symptoms of liver injury like dark yellow or brown urine; general ill feeling or flu-like symptoms; light-colored stools; loss of appetite; nausea; right upper belly pain; unusually weak or tired; yellowing of the eyes or skin Side effects that usually do not require medical attention (report to your doctor or health care professional if they continue or are bothersome):  constipation  diarrhea  nausea, vomiting  pain or redness at the injection site  unusually weak or tired This list may not describe all possible side effects. Call your doctor for medical advice about side effects. You may report side effects to FDA at 1-800-FDA-1088. Where should I keep my medicine? This drug is given in a hospital or clinic and will not be stored at home. NOTE: This sheet is a summary. It may not cover all possible information. If you have questions about this medicine, talk to your doctor, pharmacist, or health care provider.  2021 Elsevier/Gold Standard (2016-05-19 14:37:51)

## 2020-08-30 ENCOUNTER — Other Ambulatory Visit: Payer: Self-pay

## 2020-08-30 ENCOUNTER — Other Ambulatory Visit: Payer: Self-pay | Admitting: Internal Medicine

## 2020-08-30 ENCOUNTER — Inpatient Hospital Stay: Payer: PPO

## 2020-08-30 VITALS — BP 118/58 | HR 79 | Temp 97.8°F | Resp 20 | Wt 127.6 lb

## 2020-08-30 DIAGNOSIS — Z5111 Encounter for antineoplastic chemotherapy: Secondary | ICD-10-CM | POA: Diagnosis not present

## 2020-08-30 DIAGNOSIS — R790 Abnormal level of blood mineral: Secondary | ICD-10-CM

## 2020-08-30 DIAGNOSIS — C9200 Acute myeloblastic leukemia, not having achieved remission: Secondary | ICD-10-CM

## 2020-08-30 MED ORDER — AZACITIDINE CHEMO SQ INJECTION
50.0000 mg/m2 | Freq: Once | INTRAMUSCULAR | Status: AC
  Administered 2020-08-30: 87.5 mg via SUBCUTANEOUS
  Filled 2020-08-30: qty 3.5

## 2020-08-30 MED ORDER — ONDANSETRON HCL 4 MG PO TABS
8.0000 mg | ORAL_TABLET | Freq: Once | ORAL | Status: AC
  Administered 2020-08-30: 8 mg via ORAL
  Filled 2020-08-30: qty 2

## 2020-08-30 NOTE — Patient Instructions (Signed)
[facility]  Discharge Instructions: Thank you for choosing [facility] to provide your oncology and hematology care.  If you have a lab appointment with the [facility], please go directly to the [facility] and check in at the registration area.  Wear comfortable clothing and clothing appropriate for easy access to any Portacath or PICC line.   We strive to give you quality time with your provider. You may need to reschedule your appointment if you arrive late (15 or more minutes).  Arriving late affects you and other patients whose appointments are after yours.  Also, if you miss three or more appointments without notifying the office, you may be dismissed from the clinic at the provider's discretion.      For prescription refill requests, have your pharmacy contact our office and allow 72 hours for refills to be completed.    Today you received the following chemotherapy and/or immunotherapy agents       To help prevent nausea and vomiting after your treatment, we encourage you to take your nausea medication as directed.  BELOW ARE SYMPTOMS THAT SHOULD BE REPORTED IMMEDIATELY: . *FEVER GREATER THAN 100.4 F (38 C) OR HIGHER . *CHILLS OR SWEATING . *NAUSEA AND VOMITING THAT IS NOT CONTROLLED WITH YOUR NAUSEA MEDICATION . *UNUSUAL SHORTNESS OF BREATH . *UNUSUAL BRUISING OR BLEEDING . *URINARY PROBLEMS (pain or burning when urinating, or frequent urination) . *BOWEL PROBLEMS (unusual diarrhea, constipation, pain near the anus) . TENDERNESS IN MOUTH AND THROAT WITH OR WITHOUT PRESENCE OF ULCERS (sore throat, sores in mouth, or a toothache) . UNUSUAL RASH, SWELLING OR PAIN  . UNUSUAL VAGINAL DISCHARGE OR ITCHING   Items with * indicate a potential emergency and should be followed up as soon as possible or go to the Emergency Department if any problems should occur.  Please show the CHEMOTHERAPY ALERT CARD or IMMUNOTHERAPY ALERT CARD at  check-in to the Emergency Department and triage nurse.  Should you have questions after your visit or need to cancel or reschedule your appointment, please contact [facility]  [phone] and follow the prompts.  Office hours are 8:00 a.m. to 4:30 p.m. Monday - Friday. Please note that voicemails left after 4:00 p.m. may not be returned until the following business day.  We are closed weekends and major holidays. You have access to a nurse at all times for urgent questions. Please call the main number to the clinic [phone] and follow the prompts.  For any non-urgent questions, you may also contact your provider using MyChart. We now offer e-Visits for anyone 11 and older to request care online for non-urgent symptoms. For details visit [email or web address].   Also download the MyChart app! Go to the app store, search "MyChart", open the app, select [facility], and log in with your MyChart username and password.  Due to Covid, a mask is required upon entering the hospital/clinic. If you do not have a mask, one will be given to you upon arrival. For doctor visits, patients may have 1 support person aged 39 or older with them. For treatment visits, patients cannot have anyone with them due to current Covid guidelines and our immunocompromised population. Azacitidine suspension for injection (subcutaneous use) What is this medicine? AZACITIDINE (ay [place]) is a chemotherapy drug. This medicine reduces the growth of cancer cells and can suppress the immune system. It is used for treating myelodysplastic syndrome or some types of leukemia. This  medicine may be used for other purposes; ask your health care provider or pharmacist if you have questions. COMMON BRAND NAME(S): Vidaza What should I tell my health care provider before I take this medicine? They need to know if you have any of these conditions:  kidney disease  liver disease  liver  tumors  an unusual or allergic reaction to azacitidine, mannitol, other medicines, foods, dyes, or preservatives  pregnant or trying to get pregnant  breast-feeding How should I use this medicine? This medicine is for injection under the skin. It is administered in a hospital or clinic by a specially trained health care professional. Talk to your pediatrician regarding the use of this medicine in children. While this drug may be prescribed for selected conditions, precautions do apply. Overdosage: If you think you have taken too much of this medicine contact a poison control center or emergency room at once. NOTE: This medicine is only for you. Do not share this medicine with others. What if I miss a dose? It is important not to miss your dose. Call your doctor or health care professional if you are unable to keep an appointment. What may interact with this medicine? Interactions have not been studied. Give your health care provider a list of all the medicines, herbs, non-prescription drugs, or dietary supplements you use. Also tell them if you smoke, drink alcohol, or use illegal drugs. Some items may interact with your medicine. This list may not describe all possible interactions. Give your health care provider a list of all the medicines, herbs, non-prescription drugs, or dietary supplements you use. Also tell them if you smoke, drink alcohol, or use illegal drugs. Some items may interact with your medicine. What should I watch for while using this medicine? Visit your doctor for checks on your progress. This drug may make you feel generally unwell. This is not uncommon, as chemotherapy can affect healthy cells as well as cancer cells. Report any side effects. Continue your course of treatment even though you feel ill unless your doctor tells you to stop. In some cases, you may be given additional medicines to help with side effects. Follow all directions for their use. Call your doctor or  health care professional for advice if you get a fever, chills or sore throat, or other symptoms of a cold or flu. Do not treat yourself. This drug decreases your body's ability to fight infections. Try to avoid being around people who are sick. This medicine may increase your risk to bruise or bleed. Call your doctor or health care professional if you notice any unusual bleeding. You may need blood work done while you are taking this medicine. Do not become pregnant while taking this medicine and for 6 months after the last dose. Women should inform their doctor if they wish to become pregnant or think they might be pregnant. Men should not father a child while taking this medicine and for 3 months after the last dose. There is a potential for serious side effects to an unborn child. Talk to your health care professional or pharmacist for more information. Do not breast-feed an infant while taking this medicine and for 1 week after the last dose. This medicine may interfere with the ability to have a child. Talk with your doctor or health care professional if you are concerned about your fertility. What side effects may I notice from receiving this medicine? Side effects that you should report to your doctor or health care professional as soon  as possible:  allergic reactions like skin rash, itching or hives, swelling of the face, lips, or tongue  low blood counts - this medicine may decrease the number of white blood cells, red blood cells and platelets. You may be at increased risk for infections and bleeding.  signs of infection - fever or chills, cough, sore throat, pain passing urine  signs of decreased platelets or bleeding - bruising, pinpoint red spots on the skin, black, tarry stools, blood in the urine  signs of decreased red blood cells - unusually weak or tired, fainting spells, lightheadedness  signs and symptoms of kidney injury like trouble passing urine or change in the amount of  urine  signs and symptoms of liver injury like dark yellow or brown urine; general ill feeling or flu-like symptoms; light-colored stools; loss of appetite; nausea; right upper belly pain; unusually weak or tired; yellowing of the eyes or skin Side effects that usually do not require medical attention (report to your doctor or health care professional if they continue or are bothersome):  constipation  diarrhea  nausea, vomiting  pain or redness at the injection site  unusually weak or tired This list may not describe all possible side effects. Call your doctor for medical advice about side effects. You may report side effects to FDA at 1-800-FDA-1088. Where should I keep my medicine? This drug is given in a hospital or clinic and will not be stored at home. NOTE: This sheet is a summary. It may not cover all possible information. If you have questions about this medicine, talk to your doctor, pharmacist, or health care provider.  2021 Elsevier/Gold Standard (2016-05-19 14:37:51)

## 2020-09-03 ENCOUNTER — Inpatient Hospital Stay: Payer: PPO | Attending: Oncology | Admitting: Pharmacist

## 2020-09-03 ENCOUNTER — Other Ambulatory Visit: Payer: Self-pay

## 2020-09-03 DIAGNOSIS — F419 Anxiety disorder, unspecified: Secondary | ICD-10-CM | POA: Insufficient documentation

## 2020-09-03 DIAGNOSIS — M1990 Unspecified osteoarthritis, unspecified site: Secondary | ICD-10-CM | POA: Insufficient documentation

## 2020-09-03 DIAGNOSIS — G609 Hereditary and idiopathic neuropathy, unspecified: Secondary | ICD-10-CM | POA: Insufficient documentation

## 2020-09-03 DIAGNOSIS — Z85038 Personal history of other malignant neoplasm of large intestine: Secondary | ICD-10-CM | POA: Insufficient documentation

## 2020-09-03 DIAGNOSIS — K219 Gastro-esophageal reflux disease without esophagitis: Secondary | ICD-10-CM | POA: Insufficient documentation

## 2020-09-03 DIAGNOSIS — Z923 Personal history of irradiation: Secondary | ICD-10-CM | POA: Insufficient documentation

## 2020-09-03 DIAGNOSIS — C9200 Acute myeloblastic leukemia, not having achieved remission: Secondary | ICD-10-CM | POA: Insufficient documentation

## 2020-09-03 DIAGNOSIS — E039 Hypothyroidism, unspecified: Secondary | ICD-10-CM | POA: Insufficient documentation

## 2020-09-03 DIAGNOSIS — Z5111 Encounter for antineoplastic chemotherapy: Secondary | ICD-10-CM | POA: Insufficient documentation

## 2020-09-03 DIAGNOSIS — E785 Hyperlipidemia, unspecified: Secondary | ICD-10-CM | POA: Insufficient documentation

## 2020-09-03 DIAGNOSIS — Z853 Personal history of malignant neoplasm of breast: Secondary | ICD-10-CM | POA: Insufficient documentation

## 2020-09-03 MED ORDER — VALACYCLOVIR HCL 500 MG PO TABS: 500.0000 mg | ORAL_TABLET | Freq: Every day | ORAL | 2 refills | Status: DC

## 2020-09-03 NOTE — Progress Notes (Signed)
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 Currently treatment with azacitidine, venetoclax on hold.  Reason for Consult: Medication adherence visit. Patient is not currently on oral chemotherapy, today's appt is to assist with maintence medication adherence.  MEDICATIONS:  Current Outpatient Medications  Medication Sig Dispense Refill  . clonazePAM (KLONOPIN) 0.5 MG tablet Take 1.5 mg by mouth 2 (two) times daily.   0  . diphenoxylate-atropine (LOMOTIL) 2.5-0.025 MG tablet Take 1 tablet by mouth 4 (four) times daily as needed for diarrhea or loose stools. 120 tablet 0  . escitalopram (LEXAPRO) 20 MG tablet Take 20 mg by mouth every morning.     Marland Kitchen levothyroxine (SYNTHROID) 125 MCG tablet TAKE 1 TABLET BY MOUTH EVERY DAY 90 tablet 1  . metoprolol tartrate (LOPRESSOR) 25 MG tablet Take 1 tablet (25 mg total) by mouth as needed (As needed up to twice daily for palpitations). 60 tablet 1  . pantoprazole (PROTONIX) 40 MG tablet TAKE 1 TABLET BY MOUTH EVERY DAY 90 tablet 2  . valACYclovir (VALTREX) 500 MG tablet Take 500 mg by mouth daily.     Current Facility-Administered Medications  Medication Dose Route Frequency Provider Last Rate Last Admin  . ondansetron (ZOFRAN) tablet 8 mg  8 mg Oral Once Guse, Jacquelynn Cree, FNP       Facility-Administered Medications Ordered in Other Visits  Medication Dose Route Frequency Provider Last Rate Last Admin  . potassium chloride (KLOR-CON) CR tablet 40 mEq  40 mEq Oral BID Lloyd Huger, MD   40 mEq at 07/08/20 1136    VITAL SIGNS: There were no vitals taken for this visit. There were no vitals filed for this visit.   Estimated body mass index is 21.9 kg/m as calculated from the following:   Height as of 08/27/20: 5' 4"  (1.626 m).   Weight as of 08/30/20: 57.9 kg (127 lb 9.6 oz).  LABS: CBC:    Component Value Date/Time   WBC 1.2 (LL) 08/26/2020 1045   HGB 12.8 08/26/2020 1045   HGB 12.5 05/09/2018 1147   HCT 39.2 08/26/2020 1045   HCT 36.8 05/09/2018 1147   PLT 227 08/26/2020 1045   PLT 285 05/09/2018 1147   MCV 85.2 08/26/2020 1045   MCV 86 05/09/2018 1147   MCV 89 11/30/2013 1419   NEUTROABS 0.6 (L) 08/26/2020 1045   NEUTROABS 2.9 11/20/2013 1404   LYMPHSABS 0.5 (L) 08/26/2020 1045   LYMPHSABS 0.9 (L) 11/20/2013 1404   MONOABS 0.1 08/26/2020 1045   MONOABS 0.2 11/20/2013 1404   EOSABS 0.0 08/26/2020 1045   EOSABS 0.1 11/20/2013 1404   BASOSABS 0.0 08/26/2020 1045   BASOSABS 0.0 11/20/2013 1404   Comprehensive Metabolic Panel:    Component Value Date/Time   NA 142 08/26/2020 1045   NA 143 05/09/2018 1147   NA 145 11/30/2013 1419   K 4.3 08/26/2020 1045   K 3.5 11/30/2013 1419   CL 106 08/26/2020 1045   CL 109 (H) 11/30/2013 1419   CO2 25 08/26/2020 1045   CO2 29 11/30/2013 1419   BUN 20 08/26/2020 1045   BUN 11 05/09/2018 1147   BUN 15 11/30/2013 1419   CREATININE 0.92  08/26/2020 1045   CREATININE 1.04 11/30/2013 1419   GLUCOSE 99 08/26/2020 1045   GLUCOSE 97 11/30/2013 1419   CALCIUM 9.2 08/26/2020 1045   CALCIUM 8.7 11/30/2013 1419   AST 20 08/26/2020 1045   AST 28 11/30/2013 1419   ALT 29 08/26/2020 1045   ALT 71 (H) 11/30/2013 1419   ALKPHOS 112 08/26/2020 1045   ALKPHOS 114 11/30/2013 1419   BILITOT 0.5 08/26/2020 1045   BILITOT 0.7 11/30/2013 1419   PROT 6.6 08/26/2020 1045   PROT 7.1 11/30/2013 1419   ALBUMIN 3.6 08/26/2020 1045   ALBUMIN 3.3 (L) 11/30/2013 1419     Assessment and Plan-  Met with patient today and filled up 4 weeks of medication tray with levothyroxine, pantoprazole, clonazepam, valacyclovir, and escitalopram  Dr. [name] would like to  hold her magnesium and potassium to now and recheck her labs next month to see if both medications are still needed.   During today's visit, we called [facility] together. [facility] will transfer her escitalopram, pantoprazole, and levothyroxine transferred from her local [facility]. A new valacyclovir prescription will be sent from the office today. [facility] will bubble pack her medication for her, there is a $10 service charge for this. [name] agreed to this charge  [facility] will reach out to [name] about 3 weeks from today to start the process of getting her set-up her her next month of medication.  At this time [name] would like to keep her clonazepam at [place].  All medication and bottles were returned to [name]  Follow-up: No follow-up needed at this time, [facility] and [name] have my phone number to call if there is any issue with the process of getting her medications  Patient expressed understanding and was in agreement with this plan. She also understands that She can call clinic at any time with any questions, concerns, or complaints.   Thank you for allowing me to participate in the care of this very pleasant patient.   Time Total: 45 mins  Visit consisted of counseling and education on dealing with issues of symptom management in the setting of serious and potentially life-threatening illness.Greater than 50%  of this time was spent counseling and coordinating care related to the above assessment and plan.  Signed by: Darl Pikes, PharmD, BCPS, [facility], CPP Hematology/Oncology Clinical Pharmacist Practitioner [facility] [phone]  09/03/2020 3:38 PM

## 2020-09-04 ENCOUNTER — Other Ambulatory Visit: Payer: Self-pay | Admitting: Family

## 2020-09-04 ENCOUNTER — Other Ambulatory Visit: Payer: Self-pay | Admitting: Internal Medicine

## 2020-09-04 DIAGNOSIS — R002 Palpitations: Secondary | ICD-10-CM

## 2020-09-05 ENCOUNTER — Ambulatory Visit (INDEPENDENT_AMBULATORY_CARE_PROVIDER_SITE_OTHER): Payer: PPO

## 2020-09-05 ENCOUNTER — Other Ambulatory Visit: Payer: Self-pay

## 2020-09-05 DIAGNOSIS — R0600 Dyspnea, unspecified: Secondary | ICD-10-CM

## 2020-09-05 LAB — ECHOCARDIOGRAM COMPLETE
AR max vel: 2.13 cm2
AV Area VTI: 1.97 cm2
AV Area mean vel: 1.95 cm2
AV Mean grad: 3 mmHg
AV Peak grad: 4.9 mmHg
Ao pk vel: 1.11 m/s
Area-P 1/2: 4.96 cm2
Calc EF: 52.4 %
S' Lateral: 2.8 cm
Single Plane A2C EF: 52.6 %
Single Plane A4C EF: 53 %

## 2020-09-10 ENCOUNTER — Other Ambulatory Visit: Payer: Self-pay

## 2020-09-10 ENCOUNTER — Inpatient Hospital Stay: Payer: PPO

## 2020-09-10 DIAGNOSIS — M1990 Unspecified osteoarthritis, unspecified site: Secondary | ICD-10-CM | POA: Diagnosis not present

## 2020-09-10 DIAGNOSIS — Z85038 Personal history of other malignant neoplasm of large intestine: Secondary | ICD-10-CM | POA: Diagnosis not present

## 2020-09-10 DIAGNOSIS — Z853 Personal history of malignant neoplasm of breast: Secondary | ICD-10-CM | POA: Diagnosis not present

## 2020-09-10 DIAGNOSIS — E785 Hyperlipidemia, unspecified: Secondary | ICD-10-CM | POA: Diagnosis not present

## 2020-09-10 DIAGNOSIS — K219 Gastro-esophageal reflux disease without esophagitis: Secondary | ICD-10-CM | POA: Diagnosis not present

## 2020-09-10 DIAGNOSIS — C9200 Acute myeloblastic leukemia, not having achieved remission: Secondary | ICD-10-CM

## 2020-09-10 DIAGNOSIS — E039 Hypothyroidism, unspecified: Secondary | ICD-10-CM | POA: Diagnosis not present

## 2020-09-10 DIAGNOSIS — G609 Hereditary and idiopathic neuropathy, unspecified: Secondary | ICD-10-CM | POA: Diagnosis not present

## 2020-09-10 DIAGNOSIS — Z5111 Encounter for antineoplastic chemotherapy: Secondary | ICD-10-CM | POA: Diagnosis not present

## 2020-09-10 DIAGNOSIS — F419 Anxiety disorder, unspecified: Secondary | ICD-10-CM | POA: Diagnosis not present

## 2020-09-10 DIAGNOSIS — Z923 Personal history of irradiation: Secondary | ICD-10-CM | POA: Diagnosis not present

## 2020-09-10 LAB — CBC WITH DIFFERENTIAL/PLATELET
Abs Immature Granulocytes: 0 10*3/uL (ref 0.00–0.07)
Basophils Absolute: 0 10*3/uL (ref 0.0–0.1)
Basophils Relative: 1 %
Eosinophils Absolute: 0.1 10*3/uL (ref 0.0–0.5)
Eosinophils Relative: 3 %
HCT: 38 % (ref 36.0–46.0)
Hemoglobin: 12.9 g/dL (ref 12.0–15.0)
Immature Granulocytes: 0 %
Lymphocytes Relative: 32 %
Lymphs Abs: 0.6 10*3/uL — ABNORMAL LOW (ref 0.7–4.0)
MCH: 28.3 pg (ref 26.0–34.0)
MCHC: 33.9 g/dL (ref 30.0–36.0)
MCV: 83.3 fL (ref 80.0–100.0)
Monocytes Absolute: 0.2 10*3/uL (ref 0.1–1.0)
Monocytes Relative: 12 %
Neutro Abs: 1 10*3/uL — ABNORMAL LOW (ref 1.7–7.7)
Neutrophils Relative %: 52 %
Platelets: 112 10*3/uL — ABNORMAL LOW (ref 150–400)
RBC: 4.56 MIL/uL (ref 3.87–5.11)
RDW: 13.9 % (ref 11.5–15.5)
WBC: 1.9 10*3/uL — ABNORMAL LOW (ref 4.0–10.5)
nRBC: 0 % (ref 0.0–0.2)

## 2020-09-10 LAB — COMPREHENSIVE METABOLIC PANEL
ALT: 28 U/L (ref 0–44)
AST: 20 U/L (ref 15–41)
Albumin: 3.5 g/dL (ref 3.5–5.0)
Alkaline Phosphatase: 116 U/L (ref 38–126)
Anion gap: 9 (ref 5–15)
BUN: 19 mg/dL (ref 8–23)
CO2: 25 mmol/L (ref 22–32)
Calcium: 9 mg/dL (ref 8.9–10.3)
Chloride: 107 mmol/L (ref 98–111)
Creatinine, Ser: 0.82 mg/dL (ref 0.44–1.00)
GFR, Estimated: >60 mL/min (ref >60)
Glucose, Bld: 104 mg/dL — ABNORMAL HIGH (ref 70–99)
Potassium: 3.9 mmol/L (ref 3.5–5.1)
Sodium: 141 mmol/L (ref 135–145)
Total Bilirubin: 0.4 mg/dL (ref 0.3–1.2)
Total Protein: 6.5 g/dL (ref 6.5–8.1)

## 2020-09-10 LAB — MAGNESIUM: Magnesium: 1.5 mg/dL — ABNORMAL LOW (ref 1.7–2.4)

## 2020-09-10 LAB — SAMPLE TO BLOOD BANK

## 2020-09-11 ENCOUNTER — Telehealth: Payer: Self-pay | Admitting: Nurse Practitioner

## 2020-09-11 NOTE — Telephone Encounter (Signed)
Called patient and left message asking her if she wanted to reschedule the 08/14/20 Palliative Consult visit or if she wanted to cancel the referral, left my name and call back number.

## 2020-09-19 ENCOUNTER — Ambulatory Visit
Admit: 2020-09-19 | Discharge: 2020-09-20 | Payer: PRIVATE HEALTH INSURANCE | Attending: Hematology | Primary: Hematology

## 2020-09-19 ENCOUNTER — Other Ambulatory Visit: Admit: 2020-09-19 | Discharge: 2020-09-20 | Payer: PRIVATE HEALTH INSURANCE

## 2020-09-19 DIAGNOSIS — C9200 Acute myeloblastic leukemia, not having achieved remission: Secondary | ICD-10-CM | POA: Diagnosis not present

## 2020-09-19 DIAGNOSIS — C9201 Acute myeloblastic leukemia, in remission: Secondary | ICD-10-CM | POA: Diagnosis not present

## 2020-09-19 NOTE — Progress Notes (Deleted)
[place]  [phone] Fax:(336) [phone]  ID: Debra Hale OB: 1950-07-03  MR#: [id number]  CSN#:[id number]  Patient Care Team: [name], MD as PCP - General (Internal Medicine) Wellington Hampshire, MD as PCP - Cardiology (Cardiology)  CHIEF COMPLAINT: AML.  INTERVAL HISTORY: Patient returns to clinic today for further evaluation and continuation of cycle 4 of Vidaza.  Her diarrhea has significantly improved and is well controlled with Imodium.  She has a poor appetite and weight loss over the past several weeks.  She otherwise is tolerating her treatments well without significant side effects.  She continues to have chronic weakness and fatigue. She has no neurologic complaints.  She denies any recent fevers or illnesses. She has no chest pain, shortness of breath, cough, or hemoptysis.  She denies any nausea, vomiting, or constipation.  She has no urinary complaints.  Patient offers no further specific complaints today.  REVIEW OF SYSTEMS:   Review of Systems  Constitutional: Positive for malaise/fatigue and weight loss. Negative for fever.  Respiratory: Negative.  Negative for cough, hemoptysis and shortness of breath.   Cardiovascular: Negative.  Negative for chest pain and leg swelling.  Gastrointestinal: Positive for diarrhea. Negative for abdominal pain.  Genitourinary: Negative.  Negative for dysuria.  Musculoskeletal: Negative.  Negative for back pain.  Skin: Negative.  Negative for rash.  Neurological: Positive for weakness. Negative for dizziness, focal weakness and headaches.  Psychiatric/Behavioral: Positive for memory loss. The patient is nervous/anxious.     As per HPI. Otherwise, a complete review of systems is negative.  PAST MEDICAL HISTORY: Past Medical History:  Diagnosis Date  . Anxiety   . Arthritis    Osteoarthritis  . Breast cancer ([place]) 2009   right breast lumpectomy with rad tx  . Colon cancer ([place])    surgery  with chemo and rad tx  . Complication of anesthesia   . GERD (gastroesophageal reflux disease)   . Heart palpitations   . History of hiatal hernia   . History of kidney stones   . Hypothyroidism   . Liver disease   . Liver nodule    s/p negative biopsy  . Malignant neoplasm of thyroid gland ([place]) 2002   s/p surgery and XRT  . Osteoporosis   . Other and unspecified hyperlipidemia   . Palpitations   . Personal history of chemotherapy   . Personal history of malignant neoplasm of large intestine    carcinoma - cecum, s/p right laparoscopic colectomy - s/p chemotherapy and XRT  . Personal history of radiation therapy   . Pneumonia 2019  . PONV (postoperative nausea and vomiting)   . Pure hypercholesterolemia   . Unspecified hereditary and idiopathic peripheral neuropathy     PAST SURGICAL HISTORY: Past Surgical History:  Procedure Laterality Date  . APPENDECTOMY  1985  . BREAST BIOPSY Right 2009   positive  . BREAST BIOPSY Right 2009   negative  . BREAST LUMPECTOMY Right 2009   breast cancer  . CHOLECYSTECTOMY  1995  . COLONOSCOPY    . COLONOSCOPY WITH PROPOFOL N/A 10/29/2017   Procedure: COLONOSCOPY WITH PROPOFOL;  Surgeon: Manya Silvas, MD;  Location: [facility] ENDOSCOPY;  Service: Endoscopy;  Laterality: N/A;  . DILATION AND CURETTAGE OF UTERUS  1990  . DILATION AND CURETTAGE, DIAGNOSTIC / THERAPEUTIC  1990  . ESOPHAGOGASTRODUODENOSCOPY    . ESOPHAGOGASTRODUODENOSCOPY (EGD) WITH PROPOFOL N/A 10/29/2017   Procedure: ESOPHAGOGASTRODUODENOSCOPY (EGD) WITH PROPOFOL;  Surgeon: Manya Silvas, MD;  Location: [facility]  ENDOSCOPY;  Service: Endoscopy;  Laterality: N/A;  . INCISION AND DRAINAGE PERIRECTAL ABSCESS N/A 06/17/2020   Procedure: IRRIGATION AND DEBRIDEMENT PERIRECTAL ABSCESS;  Surgeon: Jules Husbands, MD;  Location: [facility] ORS;  Service: General;  Laterality: N/A;  . LAPAROSCOPIC PARTIAL COLECTOMY     stage 3-C carcinoma of the cecum, s/p chemotherapy and xrt  . LITHOTRIPSY     . SIGMOIDOSCOPY  08/26/1993  . THYROID LOBECTOMY  2002   s/p XRT  . TOTAL HIP ARTHROPLASTY Right 10/24/2019   Procedure: TOTAL HIP ARTHROPLASTY;  Surgeon: Corky Mull, MD;  Location: [facility] ORS;  Service: Orthopedics;  Laterality: Right;    FAMILY HISTORY: Family History  Problem Relation Age of Onset  . Stroke Mother        68s  . Alzheimer's disease Mother   . Cancer Mother   . Lung cancer Father   . Prostate cancer Father   . Cancer Father        Colon  . Colon cancer Father   . Breast cancer Sister        24's  . Lung cancer Sister   . Breast cancer Maternal Aunt     ADVANCED DIRECTIVES (Y/N):  N  HEALTH MAINTENANCE: Social History   Tobacco Use  . Smoking status: Never Smoker  . Smokeless tobacco: Never Used  Vaping Use  . Vaping Use: Never used  Substance Use Topics  . Alcohol use: No    Alcohol/week: 0.0 standard drinks  . Drug use: No     Colonoscopy:  PAP:  Bone density:  Lipid panel:  Allergies  Allergen Reactions  . Demeclocycline Other (See Comments)    Throat swells  . Sulfa Antibiotics     Other reaction(s): Other (See Comments) Throat swells  . Tetracyclines & Related Other (See Comments)    Throat swells   . Augmentin [Amoxicillin-Pot Clavulanate] Diarrhea  . Bentyl [Dicyclomine Hcl]     unkn  . Ciprofloxacin Diarrhea  . Codeine Other (See Comments)    dizziness    . Epinephrine     Speeds heart up - panic   . Flagyl [Metronidazole] Nausea And Vomiting  . Librax [Chlordiazepoxide-Clidinium]     unkn  . Novocain [Procaine] Other (See Comments)    "Shaky"  . Phenobarbital     unkn  . Prednisone     Nervous    . Ultram [Tramadol] Other (See Comments)    Sick feeling    Current Outpatient Medications  Medication Sig Dispense Refill  . clonazePAM (KLONOPIN) 0.5 MG tablet Take 1.5 mg by mouth 2 (two) times daily.   0  . diphenoxylate-atropine (LOMOTIL) 2.5-0.025 MG tablet Take 1 tablet by mouth 4 (four) times daily as  needed for diarrhea or loose stools. 120 tablet 0  . escitalopram (LEXAPRO) 20 MG tablet Take 20 mg by mouth every morning.     Marland Kitchen levothyroxine (SYNTHROID) 125 MCG tablet TAKE 1 TABLET BY MOUTH EVERY DAY 90 tablet 1  . metoprolol tartrate (LOPRESSOR) 25 MG tablet TAKE 1 TABLET (25 MG TOTAL) BY MOUTH AS NEEDED (AS NEEDED UP TO TWICE DAILY FOR PALPITATIONS). 60 tablet 1  . pantoprazole (PROTONIX) 40 MG tablet TAKE 1 TABLET BY MOUTH EVERY DAY 90 tablet 2  . valACYclovir (VALTREX) 500 MG tablet Take 1 tablet (500 mg total) by mouth daily. 30 tablet 2   Current Facility-Administered Medications  Medication Dose Route Frequency Provider Last Rate Last Admin  . ondansetron (ZOFRAN) tablet 8 mg  8  mg Oral Once Jodelle Green, FNP       Facility-Administered Medications Ordered in Other Visits  Medication Dose Route Frequency Provider Last Rate Last Admin  . potassium chloride (KLOR-CON) CR tablet 40 mEq  40 mEq Oral BID Lloyd Huger, MD   40 mEq at 07/08/20 1136    OBJECTIVE: There were no vitals filed for this visit.   There is no height or weight on file to calculate BMI.    ECOG FS:1 - Symptomatic but completely ambulatory  General: Well-developed, well-nourished, no acute distress. Eyes: Pink conjunctiva, anicteric sclera. HEENT: Normocephalic, moist mucous membranes. Lungs: No audible wheezing or coughing. Heart: Regular rate and rhythm. Abdomen: Soft, nontender, no obvious distention. Musculoskeletal: No edema, cyanosis, or clubbing. Neuro: Alert, answering all questions appropriately. Cranial nerves grossly intact. Skin: No rashes or petechiae noted. Psych: Normal affect.  LAB RESULTS:  Lab Results  Component Value Date   NA 141 09/10/2020   K 3.9 09/10/2020   CL 107 09/10/2020   CO2 25 09/10/2020   GLUCOSE 104 (H) 09/10/2020   BUN 19 09/10/2020   CREATININE 0.82 09/10/2020   CALCIUM 9.0 09/10/2020   PROT 6.5 09/10/2020   ALBUMIN 3.5 09/10/2020   AST 20 09/10/2020    ALT 28 09/10/2020   ALKPHOS 116 09/10/2020   BILITOT 0.4 09/10/2020   GFRNONAA >60 09/10/2020   GFRAA 54 (L) 10/26/2019    Lab Results  Component Value Date   WBC 1.9 (L) 09/10/2020   NEUTROABS 1.0 (L) 09/10/2020   HGB 12.9 09/10/2020   HCT 38.0 09/10/2020   MCV 83.3 09/10/2020   PLT 112 (L) 09/10/2020     STUDIES: ECHOCARDIOGRAM COMPLETE  Result Date: 09/05/2020    ECHOCARDIOGRAM REPORT   Patient Name:   Debra Hale Date of Exam: 09/05/2020 Medical Rec #:  [id number]      Height:       64.0 in Accession #:    [id number]     Weight:       127.6 lb Date of Birth:  10/27/1950      BSA:          1.616 m Patient Age:    70 years       BP:           108/70 mmHg Patient Gender: F              HR:           96 bpm. Exam Location:  [facility] Procedure: 2D Echo, Cardiac Doppler and Color Doppler Indications:    R06.02 SOB  History:        Patient has prior history of Echocardiogram examinations, most                 recent 05/13/2018. Signs/Symptoms:Shortness of Breath; Risk                 Factors:Non-Smoker and Dyslipidemia.  Sonographer:    Pilar Jarvis RDMS, RVT, RDCS Referring Phys: [id number] [facility]  Sonographer Comments: Very limited acoustic windows due to body habitus. IMPRESSIONS  1. Left ventricular ejection fraction, by estimation, is 60 to 65%. The left ventricle has normal function. The left ventricle has no regional wall motion abnormalities. Left ventricular diastolic parameters are consistent with Grade I diastolic dysfunction (impaired relaxation).  2. Right ventricular systolic function is normal. The right ventricular size is normal. There is normal pulmonary artery systolic pressure. The estimated right ventricular systolic pressure is 32.9  mmHg.  3. The inferior vena cava is normal in size with greater than 50% respiratory variability, suggesting right atrial pressure of 3 mmHg. FINDINGS  Left Ventricle: Left ventricular ejection fraction, by estimation, is 60 to 65%. The left  ventricle has normal function. The left ventricle has no regional wall motion abnormalities. The left ventricular internal cavity size was normal in size. There is  no left ventricular hypertrophy. Left ventricular diastolic parameters are consistent with Grade I diastolic dysfunction (impaired relaxation). Right Ventricle: The right ventricular size is normal. No increase in right ventricular wall thickness. Right ventricular systolic function is normal. There is normal pulmonary artery systolic pressure. The tricuspid regurgitant velocity is 2.64 m/s, and  with an assumed right atrial pressure of 5 mmHg, the estimated right ventricular systolic pressure is 56.3 mmHg. Left Atrium: Left atrial size was normal in size. Right Atrium: Right atrial size was normal in size. Pericardium: There is no evidence of pericardial effusion. Mitral Valve: The mitral valve is normal in structure. No evidence of mitral valve regurgitation. No evidence of mitral valve stenosis. Tricuspid Valve: The tricuspid valve is normal in structure. Tricuspid valve regurgitation is not demonstrated. No evidence of tricuspid stenosis. Aortic Valve: The aortic valve is normal in structure. Aortic valve regurgitation is not visualized. No aortic stenosis is present. Aortic valve mean gradient measures 3.0 mmHg. Aortic valve peak gradient measures 4.9 mmHg. Aortic valve area, by VTI measures 1.97 cm. Pulmonic Valve: The pulmonic valve was normal in structure. Pulmonic valve regurgitation is not visualized. No evidence of pulmonic stenosis. Aorta: The aortic root is normal in size and structure. Venous: The inferior vena cava is normal in size with greater than 50% respiratory variability, suggesting right atrial pressure of 3 mmHg. IAS/Shunts: No atrial level shunt detected by color flow Doppler.  LEFT VENTRICLE PLAX 2D LVIDd:         4.20 cm     Diastology LVIDs:         2.80 cm     LV e' medial:    5.48 cm/s LV PW:         0.80 cm     LV E/e'  medial:  11.6 LV IVS:        0.80 cm     LV e' lateral:   5.40 cm/s LVOT diam:     1.80 cm     LV E/e' lateral: 11.7 LV SV:         39 LV SV Index:   24 LVOT Area:     2.54 cm  LV Volumes (MOD) LV vol d, MOD A2C: 43.0 ml LV vol d, MOD A4C: 42.1 ml LV vol s, MOD A2C: 20.4 ml LV vol s, MOD A4C: 19.8 ml LV SV MOD A2C:     22.6 ml LV SV MOD A4C:     42.1 ml LV SV MOD BP:      22.3 ml RIGHT VENTRICLE            IVC RV Basal diam:  3.00 cm    IVC diam: 2.10 cm RV S prime:     9.68 cm/s TAPSE (M-mode): 1.7 cm LEFT ATRIUM             Index       RIGHT ATRIUM           Index LA diam:        3.00 cm 1.86 cm/m  RA Area:     10.00 cm LA Vol (  A2C):   19.1 ml 11.82 ml/m RA Volume:   21.70 ml  13.43 ml/m LA Vol (A4C):   12.4 ml 7.67 ml/m LA Biplane Vol: 16.4 ml 10.15 ml/m  AORTIC VALVE                   PULMONIC VALVE AV Area (Vmax):    2.13 cm    PV Vmax:       0.88 m/s AV Area (Vmean):   1.95 cm    PV Peak grad:  3.1 mmHg AV Area (VTI):     1.97 cm AV Vmax:           111.00 cm/s AV Vmean:          75.850 cm/s AV VTI:            0.198 m AV Peak Grad:      4.9 mmHg AV Mean Grad:      3.0 mmHg LVOT Vmax:         92.70 cm/s LVOT Vmean:        58.000 cm/s LVOT VTI:          0.153 m LVOT/AV VTI ratio: 0.77  AORTA Ao Root diam: 2.90 cm Ao Asc diam:  2.80 cm Ao Arch diam: 2.4 cm MITRAL VALVE               TRICUSPID VALVE MV Area (PHT): 4.96 cm    TR Peak grad:   27.9 mmHg MV Decel Time: 153 msec    TR Vmax:        264.00 cm/s MV E velocity: 63.40 cm/s MV A velocity: 85.70 cm/s  SHUNTS MV E/A ratio:  0.74        Systemic VTI:  0.15 m                            Systemic Diam: 1.80 cm Ida Rogue MD Electronically signed by Ida Rogue MD Signature Date/Time: 09/05/2020/6:26:00 PM    Final     ASSESSMENT: AML.  PLAN:    1.  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  Her peripheral blood flow cytometry also revealed 6% aberrant myeloblasts.  Patient completed cycle 1 of induction therapy at [facility] with Vidaza and venetoclax.   She only received 5 days of Vidaza for cycle 2 recently which was interrupted secondary to significant diarrhea and a perirectal abscess.  Unclear of patient's compliance with venetoclax.  Repeat bone marrow biopsy on July 16, 2020 did not reveal any residual disease, but cytogenetics remained positive likely indicating a very good partial remission.  Given patient's difficulty with treatment, will discontinue venetoclax altogether and proceed with dose reduced Vidaza on days 1 through 5 on a 28-day cycle.  Proceed with cycle 4, day 2 of treatment today.  Return to clinic daily for the remainder of the week to complete cycle 4 of treatment.  Patient with then return to clinic in 2 weeks for laboratory work only and then in 4 weeks for further evaluation and consideration of cycle 5, day 1.  Consider repeating bone marrow biopsy at the conclusion of cycle 6.   2.  Neutropenia: Chronic and unchanged.  Discontinue venetoclax and dose reduce Vidaza as above. 3.  Thrombocytopenia: Resolved. 4.  Perirectal abscess: Resolved.   5.  Diarrhea: Significantly improved since discontinuing venetoclax.  Continue Lomotil as prescribed. 6.  Supportive care: Patient has been given referrals to home health and home palliative  care.  Appreciate palliative care input.   7. 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  Patient also received chemotherapy and likely received Adriamycin, but exact regimen is unknown.  She completed 5 years of hormonal therapy in approximately June 2015. Currently, she has no evidence of disease.  Her most recent mammogram on March 05, 2020 was reported as BI-RADS 1.  Repeat in November 2022. 8.  History of colon cancer: Patient also states that she received chemotherapy for this, possibly FOLFOX but again this is unknown.  I spent a  total of 30 minutes reviewing chart data, face-to-face evaluation with the patient, counseling and coordination of care as detailed above.    Patient expressed understanding and was in agreement with this plan. She also understands that She can call clinic at any time with any questions, concerns, or complaints.   Cancer Staging History of breast cancer Staging form: Breast, AJCC 7th Edition - Clinical stage from 01/07/2016: Stage IA (T1c, N0, M0) - Signed by Lloyd Huger, MD on 01/07/2016 Laterality: Right Estrogen receptor status: Positive Progesterone receptor status: Positive HER2 status: Negative   Lloyd Huger, MD   09/19/2020 6:41 PM

## 2020-09-20 ENCOUNTER — Ambulatory Visit: Payer: PPO | Admitting: Physician Assistant

## 2020-09-20 DIAGNOSIS — C9200 Acute myeloblastic leukemia, not having achieved remission: Principal | ICD-10-CM

## 2020-09-20 MED ORDER — VENETOCLAX 100 MG TABLET
ORAL_TABLET | 2 refills | 0 days | Status: CP
Start: 2020-09-20 — End: ?
  Filled 2020-09-20: qty 56, 28d supply, fill #0

## 2020-09-23 ENCOUNTER — Other Ambulatory Visit: Payer: Self-pay | Admitting: Oncology

## 2020-09-23 ENCOUNTER — Inpatient Hospital Stay: Payer: PPO

## 2020-09-23 ENCOUNTER — Other Ambulatory Visit: Payer: Self-pay

## 2020-09-23 ENCOUNTER — Other Ambulatory Visit: Payer: Self-pay | Admitting: Internal Medicine

## 2020-09-23 VITALS — BP 119/62 | HR 80 | Temp 97.0°F | Resp 18

## 2020-09-23 DIAGNOSIS — Z5111 Encounter for antineoplastic chemotherapy: Secondary | ICD-10-CM | POA: Diagnosis not present

## 2020-09-23 DIAGNOSIS — C9200 Acute myeloblastic leukemia, not having achieved remission: Secondary | ICD-10-CM

## 2020-09-23 LAB — CBC WITH DIFFERENTIAL/PLATELET
Abs Immature Granulocytes: 0.01 10*3/uL (ref 0.00–0.07)
Basophils Absolute: 0 10*3/uL (ref 0.0–0.1)
Basophils Relative: 1 %
Eosinophils Absolute: 0 10*3/uL (ref 0.0–0.5)
Eosinophils Relative: 1 %
HCT: 38 % (ref 36.0–46.0)
Hemoglobin: 12.9 g/dL (ref 12.0–15.0)
Immature Granulocytes: 1 %
Lymphocytes Relative: 43 %
Lymphs Abs: 0.6 10*3/uL — ABNORMAL LOW (ref 0.7–4.0)
MCH: 27.7 pg (ref 26.0–34.0)
MCHC: 33.9 g/dL (ref 30.0–36.0)
MCV: 81.7 fL (ref 80.0–100.0)
Monocytes Absolute: 0.1 10*3/uL (ref 0.1–1.0)
Monocytes Relative: 6 %
Neutro Abs: 0.7 10*3/uL — ABNORMAL LOW (ref 1.7–7.7)
Neutrophils Relative %: 48 %
Platelets: 168 10*3/uL (ref 150–400)
RBC: 4.65 MIL/uL (ref 3.87–5.11)
RDW: 14.1 % (ref 11.5–15.5)
WBC: 1.5 10*3/uL — ABNORMAL LOW (ref 4.0–10.5)
nRBC: 0 % (ref 0.0–0.2)

## 2020-09-23 LAB — COMPREHENSIVE METABOLIC PANEL
ALT: 24 U/L (ref 0–44)
AST: 15 U/L (ref 15–41)
Albumin: 3.6 g/dL (ref 3.5–5.0)
Alkaline Phosphatase: 113 U/L (ref 38–126)
Anion gap: 11 (ref 5–15)
BUN: 17 mg/dL (ref 8–23)
CO2: 24 mmol/L (ref 22–32)
Calcium: 9 mg/dL (ref 8.9–10.3)
Chloride: 107 mmol/L (ref 98–111)
Creatinine, Ser: 0.84 mg/dL (ref 0.44–1.00)
GFR, Estimated: >60 mL/min (ref >60)
Glucose, Bld: 107 mg/dL — ABNORMAL HIGH (ref 70–99)
Potassium: 3.8 mmol/L (ref 3.5–5.1)
Sodium: 142 mmol/L (ref 135–145)
Total Bilirubin: 0.8 mg/dL (ref 0.3–1.2)
Total Protein: 6.5 g/dL (ref 6.5–8.1)

## 2020-09-23 LAB — SAMPLE TO BLOOD BANK

## 2020-09-23 LAB — MAGNESIUM: Magnesium: 1.6 mg/dL — ABNORMAL LOW (ref 1.7–2.4)

## 2020-09-23 MED ORDER — ONDANSETRON HCL 4 MG PO TABS
8.0000 mg | ORAL_TABLET | Freq: Once | ORAL | Status: AC
  Administered 2020-09-23: 8 mg via ORAL
  Filled 2020-09-23: qty 2

## 2020-09-23 MED ORDER — AZACITIDINE CHEMO SQ INJECTION
50.0000 mg/m2 | Freq: Once | INTRAMUSCULAR | Status: AC
  Administered 2020-09-23: 87.5 mg via SUBCUTANEOUS
  Filled 2020-09-23: qty 3.5

## 2020-09-23 NOTE — Patient Instructions (Signed)
[facility]  Discharge Instructions: Thank you for choosing [facility] to provide your oncology and hematology care.  If you have a lab appointment with the Cancer Center, please go directly to the Cancer Center and check in at the registration area.  Wear comfortable clothing and clothing appropriate for easy access to any Portacath or PICC line.   We strive to give you quality time with your provider. You may need to reschedule your appointment if you arrive late (15 or more minutes).  Arriving late affects you and other patients whose appointments are after yours.  Also, if you miss three or more appointments without notifying the office, you may be dismissed from the clinic at the provider's discretion.      For prescription refill requests, have your pharmacy contact our office and allow 72 hours for refills to be completed.    Today you received the following chemotherapy and/or immunotherapy agents Vidaza      To help prevent nausea and vomiting after your treatment, we encourage you to take your nausea medication as directed.  BELOW ARE SYMPTOMS THAT SHOULD BE REPORTED IMMEDIATELY: *FEVER GREATER THAN 100.4 F (38 C) OR HIGHER *CHILLS OR SWEATING *NAUSEA AND VOMITING THAT IS NOT CONTROLLED WITH YOUR NAUSEA MEDICATION *UNUSUAL SHORTNESS OF BREATH *UNUSUAL BRUISING OR BLEEDING *URINARY PROBLEMS (pain or burning when urinating, or frequent urination) *BOWEL PROBLEMS (unusual diarrhea, constipation, pain near the anus) TENDERNESS IN MOUTH AND THROAT WITH OR WITHOUT PRESENCE OF ULCERS (sore throat, sores in mouth, or a toothache) UNUSUAL RASH, SWELLING OR PAIN  UNUSUAL VAGINAL DISCHARGE OR ITCHING   Items with * indicate a potential emergency and should be followed up as soon as possible or go to the Emergency Department if any problems should occur.  Please show the CHEMOTHERAPY ALERT CARD or IMMUNOTHERAPY ALERT CARD at check-in to  the Emergency Department and triage nurse.  Should you have questions after your visit or need to cancel or reschedule your appointment, please contact [facility]  [phone] and follow the prompts.  Office hours are 8:00 a.m. to 4:30 p.m. Monday - Friday. Please note that voicemails left after 4:00 p.m. may not be returned until the following business day.  We are closed weekends and major holidays. You have access to a nurse at all times for urgent questions. Please call the main number to the clinic [phone] and follow the prompts.  For any non-urgent questions, you may also contact your provider using MyChart. We now offer e-Visits for anyone 18 and older to request care online for non-urgent symptoms. For details visit [email or web address].   Also download the MyChart app! Go to the app store, search "MyChart", open the app, select [facility], and log in with your MyChart username and password.  Due to Covid, a mask is required upon entering the hospital/clinic. If you do not have a mask, one will be given to you upon arrival. For doctor visits, patients may have 1 support person aged 18 or older with them. For treatment visits, patients cannot have anyone with them due to current Covid guidelines and our immunocompromised population.  

## 2020-09-23 NOTE — Progress Notes (Signed)
Magnesium 1.6, Pt is asking if she will receive magnesium infusion on 09/24/20. Pt is scheduled to see Dr. [name] on 09/24/20 and will discuss plan at that time. Dr. [name] aware. Pt agrees with plan, schedule given to pt.

## 2020-09-24 ENCOUNTER — Inpatient Hospital Stay: Payer: PPO | Admitting: Oncology

## 2020-09-24 ENCOUNTER — Inpatient Hospital Stay: Payer: PPO

## 2020-09-24 ENCOUNTER — Other Ambulatory Visit: Payer: Self-pay

## 2020-09-24 ENCOUNTER — Inpatient Hospital Stay (HOSPITAL_BASED_OUTPATIENT_CLINIC_OR_DEPARTMENT_OTHER): Payer: PPO | Admitting: Oncology

## 2020-09-24 ENCOUNTER — Encounter: Payer: Self-pay | Admitting: Oncology

## 2020-09-24 VITALS — BP 112/65 | HR 71 | Temp 96.6°F | Resp 17 | Wt 128.2 lb

## 2020-09-24 DIAGNOSIS — Z5111 Encounter for antineoplastic chemotherapy: Secondary | ICD-10-CM | POA: Diagnosis not present

## 2020-09-24 DIAGNOSIS — C9200 Acute myeloblastic leukemia, not having achieved remission: Secondary | ICD-10-CM

## 2020-09-24 MED ORDER — MAGNESIUM SULFATE 2 GM/50ML IV SOLN
2.0000 g | Freq: Once | INTRAVENOUS | Status: AC
Start: 2020-09-24 — End: 2020-09-24
  Administered 2020-09-24: 2 g via INTRAVENOUS
  Filled 2020-09-24: qty 50

## 2020-09-24 MED ORDER — SODIUM CHLORIDE 0.9 % IV SOLN
INTRAVENOUS | Status: DC
  Filled 2020-09-24: qty 250

## 2020-09-24 MED ORDER — ONDANSETRON HCL 4 MG PO TABS
8.0000 mg | ORAL_TABLET | Freq: Once | ORAL | Status: AC
Start: 2020-09-24 — End: 2020-09-24
  Administered 2020-09-24: 8 mg via ORAL
  Filled 2020-09-24: qty 2

## 2020-09-24 MED ORDER — AZACITIDINE CHEMO SQ INJECTION
50.0000 mg/m2 | Freq: Once | INTRAMUSCULAR | Status: AC
  Administered 2020-09-24: 87.5 mg via SUBCUTANEOUS
  Filled 2020-09-24: qty 3.5

## 2020-09-24 NOTE — Progress Notes (Signed)
[facility]  Telephone:(336) [phone] Fax:(336) [phone]  ID: Debra Hale OB: [birth year]  MR#: [id number]  VOJ#:[id number]  Patient Care Team: [name], MD as PCP - General (Internal Medicine) Wellington Hampshire, MD as PCP - Cardiology (Cardiology)  CHIEF COMPLAINT: AML.  INTERVAL HISTORY: Patient returns to clinic today for further evaluation and continuation of cycle 5 of Vidaza.  She was recently seen by [facility] oncology who have recommended reinitiating venetoclax.  Patient continues to have a poor memory and questionable compliance.  She does not complain of diarrhea today.  She is tolerating her treatments well without significant side effects. She continues to have chronic weakness and fatigue.  Her weight has remained stable.  She has no neurologic complaints.  She denies any recent fevers or illnesses. She has no chest pain, shortness of breath, cough, or hemoptysis.  She denies any nausea, vomiting, or constipation.  She has no urinary complaints.  Patient offers no further specific complaints today.  REVIEW OF SYSTEMS:   Review of Systems  Constitutional: Positive for malaise/fatigue. Negative for fever and weight loss.  Respiratory: Negative.  Negative for cough, hemoptysis and shortness of breath.   Cardiovascular: Negative.  Negative for chest pain and leg swelling.  Gastrointestinal: Negative.  Negative for abdominal pain and diarrhea.  Genitourinary: Negative.  Negative for dysuria.  Musculoskeletal: Negative.  Negative for back pain.  Skin: Negative.  Negative for rash.  Neurological: Positive for weakness. Negative for dizziness, focal weakness and headaches.  Psychiatric/Behavioral: Positive for memory loss. The patient is nervous/anxious.     As per HPI. Otherwise, a complete review of systems is negative.  PAST MEDICAL HISTORY: Past Medical History:  Diagnosis Date  . Anxiety   . Arthritis    Osteoarthritis  . Breast cancer ([place]) 2009    right breast lumpectomy with rad tx  . Colon cancer ([place])    surgery with chemo and rad tx  . Complication of anesthesia   . GERD (gastroesophageal reflux disease)   . Heart palpitations   . History of hiatal hernia   . History of kidney stones   . Hypothyroidism   . Liver disease   . Liver nodule    s/p negative biopsy  . Malignant neoplasm of thyroid gland ([place]) 2002   s/p surgery and XRT  . Osteoporosis   . Other and unspecified hyperlipidemia   . Palpitations   . Personal history of chemotherapy   . Personal history of malignant neoplasm of large intestine    carcinoma - cecum, s/p right laparoscopic colectomy - s/p chemotherapy and XRT  . Personal history of radiation therapy   . Pneumonia 2019  . PONV (postoperative nausea and vomiting)   . Pure hypercholesterolemia   . Unspecified hereditary and idiopathic peripheral neuropathy     PAST SURGICAL HISTORY: Past Surgical History:  Procedure Laterality Date  . APPENDECTOMY  1985  . BREAST BIOPSY Right 2009   positive  . BREAST BIOPSY Right 2009   negative  . BREAST LUMPECTOMY Right 2009   breast cancer  . CHOLECYSTECTOMY  1995  . COLONOSCOPY    . COLONOSCOPY WITH PROPOFOL N/A 10/29/2017   Procedure: COLONOSCOPY WITH PROPOFOL;  Surgeon: Manya Silvas, MD;  Location: [facility] ENDOSCOPY;  Service: Endoscopy;  Laterality: N/A;  . DILATION AND CURETTAGE OF UTERUS  1990  . DILATION AND CURETTAGE, DIAGNOSTIC / THERAPEUTIC  1990  . ESOPHAGOGASTRODUODENOSCOPY    . ESOPHAGOGASTRODUODENOSCOPY (EGD) WITH PROPOFOL N/A 10/29/2017   Procedure: ESOPHAGOGASTRODUODENOSCOPY (  EGD) WITH PROPOFOL;  Surgeon: Manya Silvas, MD;  Location: [facility] ENDOSCOPY;  Service: Endoscopy;  Laterality: N/A;  . INCISION AND DRAINAGE PERIRECTAL ABSCESS N/A 06/17/2020   Procedure: IRRIGATION AND DEBRIDEMENT PERIRECTAL ABSCESS;  Surgeon: Jules Husbands, MD;  Location: [facility] ORS;  Service: General;  Laterality: N/A;  . LAPAROSCOPIC PARTIAL COLECTOMY      stage 3-C carcinoma of the cecum, s/p chemotherapy and xrt  . LITHOTRIPSY    . SIGMOIDOSCOPY  08/26/1993  . THYROID LOBECTOMY  2002   s/p XRT  . TOTAL HIP ARTHROPLASTY Right 10/24/2019   Procedure: TOTAL HIP ARTHROPLASTY;  Surgeon: Corky Mull, MD;  Location: [facility] ORS;  Service: Orthopedics;  Laterality: Right;    FAMILY HISTORY: Family History  Problem Relation Age of Onset  . Stroke Mother        73s  . Alzheimer's disease Mother   . Cancer Mother   . Lung cancer Father   . Prostate cancer Father   . Cancer Father        Colon  . Colon cancer Father   . Breast cancer Sister        57's  . Lung cancer Sister   . Breast cancer Maternal Aunt     ADVANCED DIRECTIVES (Y/N):  N  HEALTH MAINTENANCE: Social History   Tobacco Use  . Smoking status: Never Smoker  . Smokeless tobacco: Never Used  Vaping Use  . Vaping Use: Never used  Substance Use Topics  . Alcohol use: No    Alcohol/week: 0.0 standard drinks  . Drug use: No     Colonoscopy:  PAP:  Bone density:  Lipid panel:  Allergies  Allergen Reactions  . Demeclocycline Other (See Comments)    Throat swells  . Sulfa Antibiotics     Other reaction(s): Other (See Comments) Throat swells  . Tetracyclines & Related Other (See Comments)    Throat swells   . Augmentin [Amoxicillin-Pot Clavulanate] Diarrhea  . Bentyl [Dicyclomine Hcl]     unkn  . Ciprofloxacin Diarrhea  . Codeine Other (See Comments)    dizziness    . Epinephrine     Speeds heart up - panic   . Flagyl [Metronidazole] Nausea And Vomiting  . Librax [Chlordiazepoxide-Clidinium]     unkn  . Novocain [Procaine] Other (See Comments)    "Shaky"  . Phenobarbital     unkn  . Prednisone     Nervous    . Ultram [Tramadol] Other (See Comments)    Sick feeling    Current Outpatient Medications  Medication Sig Dispense Refill  . clonazePAM (KLONOPIN) 0.5 MG tablet Take 1.5 mg by mouth 2 (two) times daily.   0  . diphenoxylate-atropine  (LOMOTIL) 2.5-0.025 MG tablet Take 1 tablet by mouth 4 (four) times daily as needed for diarrhea or loose stools. 120 tablet 0  . escitalopram (LEXAPRO) 20 MG tablet Take 20 mg by mouth every morning.     Marland Kitchen levothyroxine (SYNTHROID) 125 MCG tablet TAKE 1 TABLET EVERY DAY ON EMPTY STOMACHWITH A GLASS OF WATER AT LEAST 30-60 MINBEFORE BREAKFAST 90 tablet 1  . metoprolol tartrate (LOPRESSOR) 25 MG tablet TAKE 1 TABLET (25 MG TOTAL) BY MOUTH AS NEEDED (AS NEEDED UP TO TWICE DAILY FOR PALPITATIONS). 60 tablet 1  . pantoprazole (PROTONIX) 40 MG tablet TAKE 1 TABLET BY MOUTH EVERY DAY 90 tablet 2  . valACYclovir (VALTREX) 500 MG tablet TAKE 1 TABLET BY MOUTH DAILY. 30 tablet 2   Current Facility-Administered Medications  Medication Dose Route Frequency Provider Last Rate Last Admin  . ondansetron (ZOFRAN) tablet 8 mg  8 mg Oral Once Guse, Jacquelynn Cree, FNP       Facility-Administered Medications Ordered in Other Visits  Medication Dose Route Frequency Provider Last Rate Last Admin  . 0.9 %  sodium chloride infusion   Intravenous Continuous Lloyd Huger, MD 10 mL/hr at 09/24/20 1218 New Bag at 09/24/20 1218  . azaCITIDine (VIDAZA) chemo injection 87.5 mg  50 mg/m2 (Treatment Plan Recorded) Subcutaneous Once Lloyd Huger, MD      . potassium chloride (KLOR-CON) CR tablet 40 mEq  40 mEq Oral BID Lloyd Huger, MD   40 mEq at 07/08/20 1136    OBJECTIVE: Vitals:   09/24/20 1108  BP: 112/65  Pulse: 71  Resp: 17  Temp: (!) 96.6 F (35.9 C)  SpO2: 100%     Body mass index is 22.01 kg/m.    ECOG FS:1 - Symptomatic but completely ambulatory  General: Well-developed, well-nourished, no acute distress. Eyes: Pink conjunctiva, anicteric sclera. HEENT: Normocephalic, moist mucous membranes. Lungs: No audible wheezing or coughing. Heart: Regular rate and rhythm. Abdomen: Soft, nontender, no obvious distention. Musculoskeletal: No edema, cyanosis, or clubbing. Neuro: Alert, answering  all questions appropriately. Cranial nerves grossly intact. Skin: No rashes or petechiae noted. Psych: Normal affect.   LAB RESULTS:  Lab Results  Component Value Date   NA 142 09/23/2020   K 3.8 09/23/2020   CL 107 09/23/2020   CO2 24 09/23/2020   GLUCOSE 107 (H) 09/23/2020   BUN 17 09/23/2020   CREATININE 0.84 09/23/2020   CALCIUM 9.0 09/23/2020   PROT 6.5 09/23/2020   ALBUMIN 3.6 09/23/2020   AST 15 09/23/2020   ALT 24 09/23/2020   ALKPHOS 113 09/23/2020   BILITOT 0.8 09/23/2020   GFRNONAA >60 09/23/2020   GFRAA 54 (L) 10/26/2019    Lab Results  Component Value Date   WBC 1.5 (L) 09/23/2020   NEUTROABS 0.7 (L) 09/23/2020   HGB 12.9 09/23/2020   HCT 38.0 09/23/2020   MCV 81.7 09/23/2020   PLT 168 09/23/2020     STUDIES: ECHOCARDIOGRAM COMPLETE  Result Date: 09/05/2020    ECHOCARDIOGRAM REPORT   Patient Name:   Debra Hale Date of Exam: 09/05/2020 Medical Rec #:  [id number]      Height:       64.0 in Accession #:    [id number]     Weight:       127.6 lb Date of Birth:  1951-02-28      BSA:          1.616 m Patient Age:    70 years       BP:           108/70 mmHg Patient Gender: F              HR:           96 bpm. Exam Location:  [facility] Procedure: 2D Echo, Cardiac Doppler and Color Doppler Indications:    R06.02 SOB  History:        Patient has prior history of Echocardiogram examinations, most                 recent 05/13/2018. Signs/Symptoms:Shortness of Breath; Risk                 Factors:Non-Smoker and Dyslipidemia.  Sonographer:    Pilar Jarvis RDMS, RVT, RDCS Referring Phys: [phone] Loel Dubonnet  Sonographer Comments: Very limited acoustic windows due to body habitus. IMPRESSIONS  1. Left ventricular ejection fraction, by estimation, is 60 to 65%. The left ventricle has normal function. The left ventricle has no regional wall motion abnormalities. Left ventricular diastolic parameters are consistent with Grade I diastolic dysfunction (impaired relaxation).  2.  Right ventricular systolic function is normal. The right ventricular size is normal. There is normal pulmonary artery systolic pressure. The estimated right ventricular systolic pressure is 24.5 mmHg.  3. The inferior vena cava is normal in size with greater than 50% respiratory variability, suggesting right atrial pressure of 3 mmHg. FINDINGS  Left Ventricle: Left ventricular ejection fraction, by estimation, is 60 to 65%. The left ventricle has normal function. The left ventricle has no regional wall motion abnormalities. The left ventricular internal cavity size was normal in size. There is  no left ventricular hypertrophy. Left ventricular diastolic parameters are consistent with Grade I diastolic dysfunction (impaired relaxation). Right Ventricle: The right ventricular size is normal. No increase in right ventricular wall thickness. Right ventricular systolic function is normal. There is normal pulmonary artery systolic pressure. The tricuspid regurgitant velocity is 2.64 m/s, and  with an assumed right atrial pressure of 5 mmHg, the estimated right ventricular systolic pressure is 80.9 mmHg. Left Atrium: Left atrial size was normal in size. Right Atrium: Right atrial size was normal in size. Pericardium: There is no evidence of pericardial effusion. Mitral Valve: The mitral valve is normal in structure. No evidence of mitral valve regurgitation. No evidence of mitral valve stenosis. Tricuspid Valve: The tricuspid valve is normal in structure. Tricuspid valve regurgitation is not demonstrated. No evidence of tricuspid stenosis. Aortic Valve: The aortic valve is normal in structure. Aortic valve regurgitation is not visualized. No aortic stenosis is present. Aortic valve mean gradient measures 3.0 mmHg. Aortic valve peak gradient measures 4.9 mmHg. Aortic valve area, by VTI measures 1.97 cm. Pulmonic Valve: The pulmonic valve was normal in structure. Pulmonic valve regurgitation is not visualized. No evidence  of pulmonic stenosis. Aorta: The aortic root is normal in size and structure. Venous: The inferior vena cava is normal in size with greater than 50% respiratory variability, suggesting right atrial pressure of 3 mmHg. IAS/Shunts: No atrial level shunt detected by color flow Doppler.  LEFT VENTRICLE PLAX 2D LVIDd:         4.20 cm     Diastology LVIDs:         2.80 cm     LV e' medial:    5.48 cm/s LV PW:         0.80 cm     LV E/e' medial:  11.6 LV IVS:        0.80 cm     LV e' lateral:   5.40 cm/s LVOT diam:     1.80 cm     LV E/e' lateral: 11.7 LV SV:         39 LV SV Index:   24 LVOT Area:     2.54 cm  LV Volumes (MOD) LV vol d, MOD A2C: 43.0 ml LV vol d, MOD A4C: 42.1 ml LV vol s, MOD A2C: 20.4 ml LV vol s, MOD A4C: 19.8 ml LV SV MOD A2C:     22.6 ml LV SV MOD A4C:     42.1 ml LV SV MOD BP:      22.3 ml RIGHT VENTRICLE            IVC RV Basal diam:  3.00 cm    IVC diam: 2.10 cm RV S prime:     9.68 cm/s TAPSE (M-mode): 1.7 cm LEFT ATRIUM             Index       RIGHT ATRIUM           Index LA diam:        3.00 cm 1.86 cm/m  RA Area:     10.00 cm LA Vol (A2C):   19.1 ml 11.82 ml/m RA Volume:   21.70 ml  13.43 ml/m LA Vol (A4C):   12.4 ml 7.67 ml/m LA Biplane Vol: 16.4 ml 10.15 ml/m  AORTIC VALVE                   PULMONIC VALVE AV Area (Vmax):    2.13 cm    PV Vmax:       0.88 m/s AV Area (Vmean):   1.95 cm    PV Peak grad:  3.1 mmHg AV Area (VTI):     1.97 cm AV Vmax:           111.00 cm/s AV Vmean:          75.850 cm/s AV VTI:            0.198 m AV Peak Grad:      4.9 mmHg AV Mean Grad:      3.0 mmHg LVOT Vmax:         92.70 cm/s LVOT Vmean:        58.000 cm/s LVOT VTI:          0.153 m LVOT/AV VTI ratio: 0.77  AORTA Ao Root diam: 2.90 cm Ao Asc diam:  2.80 cm Ao Arch diam: 2.4 cm MITRAL VALVE               TRICUSPID VALVE MV Area (PHT): 4.96 cm    TR Peak grad:   27.9 mmHg MV Decel Time: 153 msec    TR Vmax:        264.00 cm/s MV E velocity: 63.40 cm/s MV A velocity: 85.70 cm/s  SHUNTS MV E/A ratio:   0.74        Systemic VTI:  0.15 m                            Systemic Diam: 1.80 cm Ida Rogue MD Electronically signed by Ida Rogue MD Signature Date/Time: 09/05/2020/6:26:00 PM    Final     ASSESSMENT: AML.  PLAN:    1.  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  Her peripheral blood flow cytometry also revealed 6% aberrant myeloblasts.  Patient completed cycle 1 of induction therapy at [facility] with Vidaza and venetoclax.  She only received 5 days of Vidaza for cycle 2 recently which was interrupted secondary to significant diarrhea and a perirectal abscess.  Repeat bone marrow biopsy on July 16, 2020 did not reveal any residual disease, but cytogenetics remained positive likely indicating a very good partial remission.  Patient reports she received a new prescription for venetoclax from [facility] recently, but given her history of significant noncompliance and poor social support we are hesitant to have her continue to manage oral chemotherapy at home.  Continue with dose reduced Vidaza on days 1 through 5 on a 28-day cycle.  Proceed with cycle 5, day 2 of treatment today.  Return to clinic daily for the remainder of the week to complete cycle  5 of treatment.  Patient will then return to clinic in 2 weeks for laboratory work only and then in 4 weeks for further evaluation and consideration of cycle 6, day 1. Consider repeating bone marrow biopsy at the conclusion of cycle 6.   2.  Neutropenia: Chronic and unchanged.  Continue with single agent Vidaza as above. 3.  Thrombocytopenia: Resolved. 4.  Perirectal abscess: Resolved.   5.  Diarrhea: Resolved.  Continue Lomotil as needed.  Patient's diarrhea improved significantly after discontinuing venetoclax. 6.  Supportive care: Patient has been given referrals to home health and home palliative care.  Appreciate palliative care input.   7. 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  Patient also received chemotherapy and likely received Adriamycin, but exact regimen is unknown.  She completed 5 years of hormonal therapy in approximately June 2015. Currently, she has no evidence of disease.  Her most recent mammogram on March 05, 2020 was reported as BI-RADS 1.  Repeat in November 2022. 8.  History of colon cancer: Patient also states that she received chemotherapy for this, possibly FOLFOX but again this is unknown.   Patient expressed understanding and was in agreement with this plan. She also understands that She can call clinic at any time with any questions, concerns, or complaints.   Cancer Staging History of breast cancer Staging form: Breast, AJCC 7th Edition - Clinical stage from 01/07/2016: Stage IA (T1c, N0, M0) - Signed by Lloyd Huger, MD on 01/07/2016 Laterality: Right Estrogen receptor status: Positive Progesterone receptor status: Positive HER2 status: Negative   Lloyd Huger, MD   09/24/2020 1:26 PM

## 2020-09-24 NOTE — Progress Notes (Signed)
Dr Marvel Plan at [facility]  wants pt to have magnesium infusion and start with chemo pill 4x a day

## 2020-09-24 NOTE — Progress Notes (Signed)
Nutrition Assessment   Reason for Assessment:  Weight loss, poor appetite   ASSESSMENT:  70 year old female with AML.  Past medical history of breast cancer s/p right lumpectomy, thyroid cancer s/p surgery and radiation, GERD, colon cancer with colectomy, HLD. Patient receiving vidaza.  Met with patient during infusion.  Patient reports that appetite is good but usually declines about 3 days after treatment but then picks back up.  Usually eats cereal or eggs, bacon and cheese grits for breakfast. Lunch is taco or hamburger or peanut butter sandwich or soups.  Supper is meat and vegetables or soup.  Patient goes out to eat or prepares foods in microwave.  Drinks lactaid milk due to lactose intolerance.    Patient says that she has someone that can help her prepare food or get her food if she is not feeling well enough to fix things on her own.   Noted perirectal abscess wound healed per MD note.  Medications: lomotil, protonix   Labs: reviewed   Anthropometrics:   Height: 64 inches Weight: 128 lb 3.2 today 12/21 143 lb BMI: 21  10% weight loss in the last 5 months   Estimated Energy Needs  Kcals: 1450-1700 Protein: 73-85 g Fluid: 1.4 L   NUTRITION DIAGNOSIS: Inadequate oral intake related to cancer treatment related side effects as evidenced by 10% weight loss in the last 5 months and intake decreased following treatment   INTERVENTION:  Encouraged patient to eat good sources of protein. Handout provided  Samples of shakes ([employer] shake, boost soothe, orgain dairy free) given to patient   MONITORING, EVALUATION, GOAL: weight trends, intake   Next Visit: Tuesday, June 21 during infusion   B. Zenia Resides, [place], [place] Registered Dietitian [phone] (mobile)

## 2020-09-24 NOTE — Patient Instructions (Signed)
[facility]  Discharge Instructions: Thank you for choosing [facility] to provide your oncology and hematology care.  If you have a lab appointment with the [facility], please go directly to the [facility] and check in at the registration area.  Wear comfortable clothing and clothing appropriate for easy access to any Portacath or PICC line.   We strive to give you quality time with your provider. You may need to reschedule your appointment if you arrive late (15 or more minutes).  Arriving late affects you and other patients whose appointments are after yours.  Also, if you miss three or more appointments without notifying the office, you may be dismissed from the clinic at the provider's discretion.      For prescription refill requests, have your pharmacy contact our office and allow 72 hours for refills to be completed.    Today you received the following chemotherapy and/or immunotherapy agents :  Magnesium and Vidaza To help prevent nausea and vomiting after your treatment, we encourage you to take your nausea medication as directed.  BELOW ARE SYMPTOMS THAT SHOULD BE REPORTED IMMEDIATELY: . *FEVER GREATER THAN 100.4 F (38 C) OR HIGHER . *CHILLS OR SWEATING . *NAUSEA AND VOMITING THAT IS NOT CONTROLLED WITH YOUR NAUSEA MEDICATION . *UNUSUAL SHORTNESS OF BREATH . *UNUSUAL BRUISING OR BLEEDING . *URINARY PROBLEMS (pain or burning when urinating, or frequent urination) . *BOWEL PROBLEMS (unusual diarrhea, constipation, pain near the anus) . TENDERNESS IN MOUTH AND THROAT WITH OR WITHOUT PRESENCE OF ULCERS (sore throat, sores in mouth, or a toothache) . UNUSUAL RASH, SWELLING OR PAIN  . UNUSUAL VAGINAL DISCHARGE OR ITCHING   Items with * indicate a potential emergency and should be followed up as soon as possible or go to the Emergency Department if any problems should occur.  Please show the CHEMOTHERAPY ALERT CARD or  IMMUNOTHERAPY ALERT CARD at check-in to the Emergency Department and triage nurse.  Should you have questions after your visit or need to cancel or reschedule your appointment, please contact [facility]  [phone] and follow the prompts.  Office hours are 8:00 a.m. to 4:30 p.m. Monday - Friday. Please note that voicemails left after 4:00 p.m. may not be returned until the following business day.  We are closed weekends and major holidays. You have access to a nurse at all times for urgent questions. Please call the main number to the clinic [phone] and follow the prompts.  For any non-urgent questions, you may also contact your provider using MyChart. We now offer e-Visits for anyone 2 and older to request care online for non-urgent symptoms. For details visit [email or web address].   Also download the MyChart app! Go to the app store, search "MyChart", open the app, select [facility], and log in with your MyChart username and password.  Due to Covid, a mask is required upon entering the hospital/clinic. If you do not have a mask, one will be given to you upon arrival. For doctor visits, patients may have 1 support person aged 67 or older with them. For treatment visits, patients cannot have anyone with them due to current Covid guidelines and our immunocompromised population.   Magnesium Sulfate injection What is this medicine? MAGNESIUM SULFATE (mag NEE zee um SUL fate) is an electrolyte injection commonly used to treat low magnesium levels in your blood. It is also used to prevent or control seizures in women with preeclampsia or eclampsia. This medicine  may be used for other purposes; ask your health care provider or pharmacist if you have questions. What should I tell my health care provider before I take this medicine? They need to know if you have any of these conditions:  heart disease  history of irregular heart beat  kidney disease  an  unusual or allergic reaction to magnesium sulfate, medicines, foods, dyes, or preservatives  pregnant or trying to get pregnant  breast-feeding How should I use this medicine? This medicine is for infusion into a vein. It is given by a health care professional in a hospital or clinic setting. Talk to your pediatrician regarding the use of this medicine in children. While this drug may be prescribed for selected conditions, precautions do apply. Overdosage: If you think you have taken too much of this medicine contact a poison control center or emergency room at once. NOTE: This medicine is only for you. Do not share this medicine with others. What if I miss a dose? This does not apply. What may interact with this medicine? This medicine may interact with the following medications:  certain medicines for anxiety or sleep  certain medicines for seizures like phenobarbital  digoxin  medicines that relax muscles for surgery  narcotic medicines for pain This list may not describe all possible interactions. Give your health care provider a list of all the medicines, herbs, non-prescription drugs, or dietary supplements you use. Also tell them if you smoke, drink alcohol, or use illegal drugs. Some items may interact with your medicine. What should I watch for while using this medicine? Your condition will be monitored carefully while you are receiving this medicine. You may need blood work done while you are receiving this medicine. What side effects may I notice from receiving this medicine? Side effects that you should report to your doctor or health care professional as soon as possible:  allergic reactions like skin rash, itching or hives, swelling of the face, lips, or tongue  facial flushing  muscle weakness  signs and symptoms of low blood pressure like dizziness; feeling faint or lightheaded, falls; unusually weak or tired  signs and symptoms of a dangerous change in heartbeat  or heart rhythm like chest pain; dizziness; fast or irregular heartbeat; palpitations; breathing problems  sweating This list may not describe all possible side effects. Call your doctor for medical advice about side effects. You may report side effects to FDA at 1-800-FDA-1088. Where should I keep my medicine? This drug is given in a hospital or clinic and will not be stored at home. NOTE: This sheet is a summary. It may not cover all possible information. If you have questions about this medicine, talk to your doctor, pharmacist, or health care provider.  2021 Elsevier/Gold Standard (2015-11-06 12:31:42)   Azacitidine suspension for injection (subcutaneous use) What is this medicine? AZACITIDINE (ay [name]) is a chemotherapy drug. This medicine reduces the growth of cancer cells and can suppress the immune system. It is used for treating myelodysplastic syndrome or some types of leukemia. This medicine may be used for other purposes; ask your health care provider or pharmacist if you have questions. COMMON BRAND NAME(S): Vidaza What should I tell my health care provider before I take this medicine? They need to know if you have any of these conditions:  kidney disease  liver disease  liver tumors  an unusual or allergic reaction to azacitidine, mannitol, other medicines, foods, dyes, or preservatives  pregnant or trying to get  pregnant  breast-feeding How should I use this medicine? This medicine is for injection under the skin. It is administered in a hospital or clinic by a specially trained health care professional. Talk to your pediatrician regarding the use of this medicine in children. While this drug may be prescribed for selected conditions, precautions do apply. Overdosage: If you think you have taken too much of this medicine contact a poison control center or emergency room at once. NOTE: This medicine is only for you. Do not share this medicine with  others. What if I miss a dose? It is important not to miss your dose. Call your doctor or health care professional if you are unable to keep an appointment. What may interact with this medicine? Interactions have not been studied. Give your health care provider a list of all the medicines, herbs, non-prescription drugs, or dietary supplements you use. Also tell them if you smoke, drink alcohol, or use illegal drugs. Some items may interact with your medicine. This list may not describe all possible interactions. Give your health care provider a list of all the medicines, herbs, non-prescription drugs, or dietary supplements you use. Also tell them if you smoke, drink alcohol, or use illegal drugs. Some items may interact with your medicine. What should I watch for while using this medicine? Visit your doctor for checks on your progress. This drug may make you feel generally unwell. This is not uncommon, as chemotherapy can affect healthy cells as well as cancer cells. Report any side effects. Continue your course of treatment even though you feel ill unless your doctor tells you to stop. In some cases, you may be given additional medicines to help with side effects. Follow all directions for their use. Call your doctor or health care professional for advice if you get a fever, chills or sore throat, or other symptoms of a cold or flu. Do not treat yourself. This drug decreases your body's ability to fight infections. Try to avoid being around people who are sick. This medicine may increase your risk to bruise or bleed. Call your doctor or health care professional if you notice any unusual bleeding. You may need blood work done while you are taking this medicine. Do not become pregnant while taking this medicine and for 6 months after the last dose. Women should inform their doctor if they wish to become pregnant or think they might be pregnant. Men should not father a child while taking this medicine  and for 3 months after the last dose. There is a potential for serious side effects to an unborn child. Talk to your health care professional or pharmacist for more information. Do not breast-feed an infant while taking this medicine and for 1 week after the last dose. This medicine may interfere with the ability to have a child. Talk with your doctor or health care professional if you are concerned about your fertility. What side effects may I notice from receiving this medicine? Side effects that you should report to your doctor or health care professional as soon as possible:  allergic reactions like skin rash, itching or hives, swelling of the face, lips, or tongue  low blood counts - this medicine may decrease the number of white blood cells, red blood cells and platelets. You may be at increased risk for infections and bleeding.  signs of infection - fever or chills, cough, sore throat, pain passing urine  signs of decreased platelets or bleeding - bruising, pinpoint red spots on the  skin, black, tarry stools, blood in the urine  signs of decreased red blood cells - unusually weak or tired, fainting spells, lightheadedness  signs and symptoms of kidney injury like trouble passing urine or change in the amount of urine  signs and symptoms of liver injury like dark yellow or brown urine; general ill feeling or flu-like symptoms; light-colored stools; loss of appetite; nausea; right upper belly pain; unusually weak or tired; yellowing of the eyes or skin Side effects that usually do not require medical attention (report to your doctor or health care professional if they continue or are bothersome):  constipation  diarrhea  nausea, vomiting  pain or redness at the injection site  unusually weak or tired This list may not describe all possible side effects. Call your doctor for medical advice about side effects. You may report side effects to FDA at 1-800-FDA-1088. Where should I keep  my medicine? This drug is given in a hospital or clinic and will not be stored at home. NOTE: This sheet is a summary. It may not cover all possible information. If you have questions about this medicine, talk to your doctor, pharmacist, or health care provider.  2021 Elsevier/Gold Standard (2016-05-19 14:37:51)

## 2020-09-25 ENCOUNTER — Inpatient Hospital Stay: Payer: PPO

## 2020-09-25 ENCOUNTER — Other Ambulatory Visit: Payer: Self-pay

## 2020-09-25 VITALS — BP 129/75 | HR 65 | Temp 98.6°F | Resp 16

## 2020-09-25 DIAGNOSIS — Z5111 Encounter for antineoplastic chemotherapy: Secondary | ICD-10-CM | POA: Diagnosis not present

## 2020-09-25 DIAGNOSIS — C9200 Acute myeloblastic leukemia, not having achieved remission: Secondary | ICD-10-CM

## 2020-09-25 MED ORDER — AZACITIDINE CHEMO SQ INJECTION
50.0000 mg/m2 | Freq: Once | INTRAMUSCULAR | Status: AC
  Administered 2020-09-25: 87.5 mg via SUBCUTANEOUS
  Filled 2020-09-25: qty 3.5

## 2020-09-25 MED ORDER — ONDANSETRON HCL 4 MG PO TABS
8.0000 mg | ORAL_TABLET | Freq: Once | ORAL | Status: AC
  Administered 2020-09-25: 8 mg via ORAL
  Filled 2020-09-25: qty 2

## 2020-09-25 NOTE — Patient Instructions (Signed)
[facility]  Discharge Instructions: Thank you for choosing [facility] to provide your oncology and hematology care.  If you have a lab appointment with the [facility], please go directly to the [facility] and check in at the registration area.  Wear comfortable clothing and clothing appropriate for easy access to any Portacath or PICC line.   We strive to give you quality time with your provider. You may need to reschedule your appointment if you arrive late (15 or more minutes).  Arriving late affects you and other patients whose appointments are after yours.  Also, if you miss three or more appointments without notifying the office, you may be dismissed from the clinic at the provider's discretion.      For prescription refill requests, have your pharmacy contact our office and allow 72 hours for refills to be completed.    Today you received the following chemotherapy and/or immunotherapy agents Vidaza  Azacitidine suspension for injection (subcutaneous use) What is this medicine? AZACITIDINE (ay [place]) is a chemotherapy drug. This medicine reduces the growth of cancer cells and can suppress the immune system. It is used for treating myelodysplastic syndrome or some types of leukemia. This medicine may be used for other purposes; ask your health care provider or pharmacist if you have questions. COMMON BRAND NAME(S): Vidaza What should I tell my health care provider before I take this medicine? They need to know if you have any of these conditions:  kidney disease  liver disease  liver tumors  an unusual or allergic reaction to azacitidine, mannitol, other medicines, foods, dyes, or preservatives  pregnant or trying to get pregnant  breast-feeding How should I use this medicine? This medicine is for injection under the skin. It is administered in a hospital or clinic by a specially trained health care  professional. Talk to your pediatrician regarding the use of this medicine in children. While this drug may be prescribed for selected conditions, precautions do apply. Overdosage: If you think you have taken too much of this medicine contact a poison control center or emergency room at once. NOTE: This medicine is only for you. Do not share this medicine with others. What if I miss a dose? It is important not to miss your dose. Call your doctor or health care professional if you are unable to keep an appointment. What may interact with this medicine? Interactions have not been studied. Give your health care provider a list of all the medicines, herbs, non-prescription drugs, or dietary supplements you use. Also tell them if you smoke, drink alcohol, or use illegal drugs. Some items may interact with your medicine. This list may not describe all possible interactions. Give your health care provider a list of all the medicines, herbs, non-prescription drugs, or dietary supplements you use. Also tell them if you smoke, drink alcohol, or use illegal drugs. Some items may interact with your medicine. What should I watch for while using this medicine? Visit your doctor for checks on your progress. This drug may make you feel generally unwell. This is not uncommon, as chemotherapy can affect healthy cells as well as cancer cells. Report any side effects. Continue your course of treatment even though you feel ill unless your doctor tells you to stop. In some cases, you may be given additional medicines to help with side effects. Follow all directions for their use. Call your doctor or health care professional for advice if you get  a fever, chills or sore throat, or other symptoms of a cold or flu. Do not treat yourself. This drug decreases your body's ability to fight infections. Try to avoid being around people who are sick. This medicine may increase your risk to bruise or bleed. Call your doctor or health  care professional if you notice any unusual bleeding. You may need blood work done while you are taking this medicine. Do not become pregnant while taking this medicine and for 6 months after the last dose. Women should inform their doctor if they wish to become pregnant or think they might be pregnant. Men should not father a child while taking this medicine and for 3 months after the last dose. There is a potential for serious side effects to an unborn child. Talk to your health care professional or pharmacist for more information. Do not breast-feed an infant while taking this medicine and for 1 week after the last dose. This medicine may interfere with the ability to have a child. Talk with your doctor or health care professional if you are concerned about your fertility. What side effects may I notice from receiving this medicine? Side effects that you should report to your doctor or health care professional as soon as possible:  allergic reactions like skin rash, itching or hives, swelling of the face, lips, or tongue  low blood counts - this medicine may decrease the number of white blood cells, red blood cells and platelets. You may be at increased risk for infections and bleeding.  signs of infection - fever or chills, cough, sore throat, pain passing urine  signs of decreased platelets or bleeding - bruising, pinpoint red spots on the skin, black, tarry stools, blood in the urine  signs of decreased red blood cells - unusually weak or tired, fainting spells, lightheadedness  signs and symptoms of kidney injury like trouble passing urine or change in the amount of urine  signs and symptoms of liver injury like dark yellow or brown urine; general ill feeling or flu-like symptoms; light-colored stools; loss of appetite; nausea; right upper belly pain; unusually weak or tired; yellowing of the eyes or skin Side effects that usually do not require medical attention (report to your doctor or  health care professional if they continue or are bothersome):  constipation  diarrhea  nausea, vomiting  pain or redness at the injection site  unusually weak or tired This list may not describe all possible side effects. Call your doctor for medical advice about side effects. You may report side effects to FDA at 1-800-FDA-1088. Where should I keep my medicine? This drug is given in a hospital or clinic and will not be stored at home. NOTE: This sheet is a summary. It may not cover all possible information. If you have questions about this medicine, talk to your doctor, pharmacist, or health care provider.  2021 Elsevier/Gold Standard (2016-05-19 14:37:51)       To help prevent nausea and vomiting after your treatment, we encourage you to take your nausea medication as directed.  BELOW ARE SYMPTOMS THAT SHOULD BE REPORTED IMMEDIATELY: . *FEVER GREATER THAN 100.4 F (38 C) OR HIGHER . *CHILLS OR SWEATING . *NAUSEA AND VOMITING THAT IS NOT CONTROLLED WITH YOUR NAUSEA MEDICATION . *UNUSUAL SHORTNESS OF BREATH . *UNUSUAL BRUISING OR BLEEDING . *URINARY PROBLEMS (pain or burning when urinating, or frequent urination) . *BOWEL PROBLEMS (unusual diarrhea, constipation, pain near the anus) . TENDERNESS IN MOUTH AND THROAT WITH OR WITHOUT PRESENCE OF  ULCERS (sore throat, sores in mouth, or a toothache) . UNUSUAL RASH, SWELLING OR PAIN  . UNUSUAL VAGINAL DISCHARGE OR ITCHING   Items with * indicate a potential emergency and should be followed up as soon as possible or go to the Emergency Department if any problems should occur.  Please show the CHEMOTHERAPY ALERT CARD or IMMUNOTHERAPY ALERT CARD at check-in to the Emergency Department and triage nurse.  Should you have questions after your visit or need to cancel or reschedule your appointment, please contact [facility]  [phone] and follow the prompts.  Office hours are 8:00 a.m. to 4:30 p.m.  Monday - Friday. Please note that voicemails left after 4:00 p.m. may not be returned until the following business day.  We are closed weekends and major holidays. You have access to a nurse at all times for urgent questions. Please call the main number to the clinic [phone] and follow the prompts.  For any non-urgent questions, you may also contact your provider using MyChart. We now offer e-Visits for anyone 33 and older to request care online for non-urgent symptoms. For details visit [email or web address].   Also download the MyChart app! Go to the app store, search "MyChart", open the app, select [facility], and log in with your MyChart username and password.  Due to Covid, a mask is required upon entering the hospital/clinic. If you do not have a mask, one will be given to you upon arrival. For doctor visits, patients may have 1 support person aged 70 or older with them. For treatment visits, patients cannot have anyone with them due to current Covid guidelines and our immunocompromised population.

## 2020-09-26 ENCOUNTER — Inpatient Hospital Stay: Payer: PPO

## 2020-09-26 ENCOUNTER — Inpatient Hospital Stay: Payer: PPO | Admitting: Pharmacist

## 2020-09-26 VITALS — BP 138/72 | HR 72 | Temp 97.5°F

## 2020-09-26 DIAGNOSIS — C9200 Acute myeloblastic leukemia, not having achieved remission: Secondary | ICD-10-CM

## 2020-09-26 DIAGNOSIS — Z5111 Encounter for antineoplastic chemotherapy: Secondary | ICD-10-CM | POA: Diagnosis not present

## 2020-09-26 MED ORDER — ONDANSETRON HCL 4 MG PO TABS
8.0000 mg | ORAL_TABLET | Freq: Once | ORAL | Status: AC
  Administered 2020-09-26: 8 mg via ORAL
  Filled 2020-09-26: qty 2

## 2020-09-26 MED ORDER — AZACITIDINE CHEMO SQ INJECTION
50.0000 mg/m2 | Freq: Once | INTRAMUSCULAR | Status: AC
  Administered 2020-09-26: 87.5 mg via SUBCUTANEOUS
  Filled 2020-09-26: qty 3.5

## 2020-09-26 NOTE — Progress Notes (Signed)
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 Currently treatment with azacitidine and venetoclax. Venetoclax therapy was resumed on Monday 09/23/20.  Reason for Consult: Oral chemotherapy follow-up for venetoclax therapy.   PAST MEDICAL HISTORY: Past Medical History:  Diagnosis Date  . Anxiety   . Arthritis    Osteoarthritis  . Breast cancer ([place]) 2009   right breast lumpectomy with rad tx  . Colon cancer ([place])    surgery with chemo and rad tx  . Complication of anesthesia   . GERD (gastroesophageal reflux disease)   . Heart palpitations   . History of hiatal hernia   . History of kidney stones   . Hypothyroidism   . Liver disease   . Liver nodule    s/p negative biopsy  . 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 Osteoporosis   . Other and unspecified hyperlipidemia   . Palpitations   . Personal history of chemotherapy   . Personal history of malignant neoplasm of large intestine    carcinoma - cecum, s/p right laparoscopic colectomy - s/p chemotherapy and XRT  . Personal history of radiation therapy   . Pneumonia 2019  . PONV (postoperative nausea and vomiting)   . Pure hypercholesterolemia   . Unspecified hereditary and idiopathic peripheral neuropathy     HEMATOLOGY/ONCOLOGY HISTORY:  Oncology History  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[facility])  04/12/2020 Initial Diagnosis   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[place])   06/10/2020 -  Chemotherapy    Patient is on Treatment Plan: AML DOSE REDUCED AZACITIDINE SQ D1-5 Q28D        ALLERGIES:  is allergic to demeclocycline, sulfa antibiotics, tetracyclines & related,  augmentin [amoxicillin-pot clavulanate], bentyl [dicyclomine hcl], ciprofloxacin, codeine, epinephrine, flagyl [metronidazole], librax [chlordiazepoxide-clidinium], novocain [procaine], phenobarbital, prednisone, and ultram [tramadol].  MEDICATIONS:  Current Outpatient Medications  Medication Sig Dispense Refill  . clonazePAM (KLONOPIN) 0.5 MG tablet Take 1.5 mg by mouth 2 (two) times daily.   0  . diphenoxylate-atropine (LOMOTIL) 2.5-0.025 MG tablet Take 1 tablet by mouth 4 (four) times daily as needed for diarrhea or loose stools. 120 tablet 0  . escitalopram (LEXAPRO) 20 MG tablet Take 20 mg by mouth every morning.     Marland Kitchen levothyroxine (SYNTHROID) 125 MCG tablet TAKE 1 TABLET EVERY DAY ON EMPTY STOMACHWITH A GLASS OF WATER AT LEAST 30-60 MINBEFORE BREAKFAST 90 tablet 1  . metoprolol tartrate (LOPRESSOR) 25 MG tablet TAKE 1 TABLET (25 MG TOTAL) BY MOUTH AS NEEDED (AS NEEDED UP TO TWICE DAILY FOR PALPITATIONS). 60 tablet 1  . pantoprazole (PROTONIX) 40 MG tablet TAKE 1 TABLET BY MOUTH EVERY DAY 90 tablet 2  . valACYclovir (VALTREX) 500 MG tablet TAKE 1 TABLET BY MOUTH DAILY. 30 tablet 2   Current Facility-Administered Medications  Medication Dose Route Frequency Provider Last Rate Last Admin  . ondansetron (ZOFRAN) tablet 8 mg  8 mg Oral Once Guse, Jacquelynn Cree, FNP       Facility-Administered Medications Ordered in Other Visits  Medication Dose Route Frequency Provider Last Rate Last Admin  . potassium chloride (KLOR-CON) CR tablet 40 mEq  40 mEq Oral BID Lloyd Huger,  MD   40 mEq at 07/08/20 1136    VITAL SIGNS: There were no vitals taken for this visit. There were no vitals filed for this visit.  Estimated body mass index is 22.01 kg/m as calculated from the following:   Height as of 08/27/20: 5\' 4"  (1.626 m).   Weight as of 09/24/20: 58.2 kg (128 lb 3.2 oz).  LABS: CBC:    Component Value Date/Time   WBC 1.5 (L) 09/23/2020 1325   HGB 12.9 09/23/2020 1325   HGB 12.5 05/09/2018  1147   HCT 38.0 09/23/2020 1325   HCT 36.8 05/09/2018 1147   PLT 168 09/23/2020 1325   PLT 285 05/09/2018 1147   MCV 81.7 09/23/2020 1325   MCV 86 05/09/2018 1147   MCV 89 11/30/2013 1419   NEUTROABS 0.7 (L) 09/23/2020 1325   NEUTROABS 2.9 11/20/2013 1404   LYMPHSABS 0.6 (L) 09/23/2020 1325   LYMPHSABS 0.9 (L) 11/20/2013 1404   MONOABS 0.1 09/23/2020 1325   MONOABS 0.2 11/20/2013 1404   EOSABS 0.0 09/23/2020 1325   EOSABS 0.1 11/20/2013 1404   BASOSABS 0.0 09/23/2020 1325   BASOSABS 0.0 11/20/2013 1404   Comprehensive Metabolic Panel:    Component Value Date/Time   NA 142 09/23/2020 1325   NA 143 05/09/2018 1147   NA 145 11/30/2013 1419   K 3.8 09/23/2020 1325   K 3.5 11/30/2013 1419   CL 107 09/23/2020 1325   CL 109 (H) 11/30/2013 1419   CO2 24 09/23/2020 1325   CO2 29 11/30/2013 1419   BUN 17 09/23/2020 1325   BUN 11 05/09/2018 1147   BUN 15 11/30/2013 1419   CREATININE 0.84 09/23/2020 1325   CREATININE 1.04 11/30/2013 1419   GLUCOSE 107 (H) 09/23/2020 1325   GLUCOSE 97 11/30/2013 1419   CALCIUM 9.0 09/23/2020 1325   CALCIUM 8.7 11/30/2013 1419   AST 15 09/23/2020 1325   AST 28 11/30/2013 1419   ALT 24 09/23/2020 1325   ALT 71 (H) 11/30/2013 1419   ALKPHOS 113 09/23/2020 1325   ALKPHOS 114 11/30/2013 1419   BILITOT 0.8 09/23/2020 1325   BILITOT 0.7 11/30/2013 1419   PROT 6.5 09/23/2020 1325   PROT 7.1 11/30/2013 1419   ALBUMIN 3.6 09/23/2020 1325   ALBUMIN 3.3 (L) 11/30/2013 1419     Present during today's visit: patient only, seen in infusion  Assessment and Plan: Continue venetoclax, treatment resumed 09/23/20. She will take 400mg  for 14 days, then hold for 14 days. Provided patient with medication calendar to help with adherence.   Oral Chemotherapy Side Effect/Intolerance:  Recently resumed, no reported side effects  Oral Chemotherapy Adherence: no missed doses since starting The schedule is a potential patient barriers to medication adherence  identified. Calendar provided to help with adherence.  New medications: No new medications.  Medication Access Issues: No access issues. [facility]oclax filled at [facility].   Other: Pt is established with [facility] for pill packaging of her maintenance medications.   Patient expressed understanding and was in agreement with this plan. She also understands that She can call clinic at any time with any questions, concerns, or complaints.   Follow-up plan:   Thank you for allowing me to participate in the care of this very pleasant patient.   Time Total: 15 mins  Visit consisted of counseling and education on dealing with issues of symptom management in the setting of serious and potentially life-threatening illness.Greater than 50%  of this time was spent counseling and  coordinating care related to the above assessment and plan.  Signed by: Darl Pikes, PharmD, BCPS, Salley Slaughter, CPP Hematology/Oncology Clinical Pharmacist Practitioner [facility]/HP/AP [facility] [phone]  09/26/2020 2:54 PM

## 2020-09-26 NOTE — Patient Instructions (Signed)
[facility] ONCOLOGY  Discharge Instructions: Thank you for choosing [facility] to provide your oncology and hematology care.  If you have a lab appointment with the [facility], please go directly to the [facility] and check in at the registration area.  Wear comfortable clothing and clothing appropriate for easy access to any Portacath or PICC line.   We strive to give you quality time with your provider. You may need to reschedule your appointment if you arrive late (15 or more minutes).  Arriving late affects you and other patients whose appointments are after yours.  Also, if you miss three or more appointments without notifying the office, you may be dismissed from the clinic at the provider's discretion.      For prescription refill requests, have your pharmacy contact our office and allow 72 hours for refills to be completed.    Today you received the following chemotherapy and/or immunotherapy agents Vidaza      To help prevent nausea and vomiting after your treatment, we encourage you to take your nausea medication as directed.  BELOW ARE SYMPTOMS THAT SHOULD BE REPORTED IMMEDIATELY: . *FEVER GREATER THAN 100.4 F (38 C) OR HIGHER . *CHILLS OR SWEATING . *NAUSEA AND VOMITING THAT IS NOT CONTROLLED WITH YOUR NAUSEA MEDICATION . *UNUSUAL SHORTNESS OF BREATH . *UNUSUAL BRUISING OR BLEEDING . *URINARY PROBLEMS (pain or burning when urinating, or frequent urination) . *BOWEL PROBLEMS (unusual diarrhea, constipation, pain near the anus) . TENDERNESS IN MOUTH AND THROAT WITH OR WITHOUT PRESENCE OF ULCERS (sore throat, sores in mouth, or a toothache) . UNUSUAL RASH, SWELLING OR PAIN  . UNUSUAL VAGINAL DISCHARGE OR ITCHING   Items with * indicate a potential emergency and should be followed up as soon as possible or go to the Emergency Department if any problems should occur.  Please show the CHEMOTHERAPY ALERT CARD or IMMUNOTHERAPY ALERT  CARD at check-in to the Emergency Department and triage nurse.  Should you have questions after your visit or need to cancel or reschedule your appointment, please contact [facility]  [phone] and follow the prompts.  Office hours are 8:00 a.m. to 4:30 p.m. Monday - Friday. Please note that voicemails left after 4:00 p.m. may not be returned until the following business day.  We are closed weekends and major holidays. You have access to a nurse at all times for urgent questions. Please call the main number to the clinic [phone] and follow the prompts.  For any non-urgent questions, you may also contact your provider using MyChart. We now offer e-Visits for anyone 15 and older to request care online for non-urgent symptoms. For details visit [email or web address].   Also download the MyChart app! Go to the app store, search "MyChart", open the app, select [facility], and log in with your MyChart username and password.  Due to Covid, a mask is required upon entering the hospital/clinic. If you do not have a mask, one will be given to you upon arrival. For doctor visits, patients may have 1 support person aged 62 or older with them. For treatment visits, patients cannot have anyone with them due to current Covid guidelines and our immunocompromised population. Azacitidine suspension for injection (subcutaneous use) What is this medicine? AZACITIDINE (ay Gideon) is a chemotherapy drug. This medicine reduces the growth of cancer cells and can suppress the immune system. It is used for treating myelodysplastic syndrome or some types of leukemia. This  medicine may be used for other purposes; ask your health care provider or pharmacist if you have questions. COMMON BRAND NAME(S): Vidaza What should I tell my health care provider before I take this medicine? They need to know if you have any of these conditions:  kidney disease  liver disease  liver  tumors  an unusual or allergic reaction to azacitidine, mannitol, other medicines, foods, dyes, or preservatives  pregnant or trying to get pregnant  breast-feeding How should I use this medicine? This medicine is for injection under the skin. It is administered in a hospital or clinic by a specially trained health care professional. Talk to your pediatrician regarding the use of this medicine in children. While this drug may be prescribed for selected conditions, precautions do apply. Overdosage: If you think you have taken too much of this medicine contact a poison control center or emergency room at once. NOTE: This medicine is only for you. Do not share this medicine with others. What if I miss a dose? It is important not to miss your dose. Call your doctor or health care professional if you are unable to keep an appointment. What may interact with this medicine? Interactions have not been studied. Give your health care provider a list of all the medicines, herbs, non-prescription drugs, or dietary supplements you use. Also tell them if you smoke, drink alcohol, or use illegal drugs. Some items may interact with your medicine. This list may not describe all possible interactions. Give your health care provider a list of all the medicines, herbs, non-prescription drugs, or dietary supplements you use. Also tell them if you smoke, drink alcohol, or use illegal drugs. Some items may interact with your medicine. What should I watch for while using this medicine? Visit your doctor for checks on your progress. This drug may make you feel generally unwell. This is not uncommon, as chemotherapy can affect healthy cells as well as cancer cells. Report any side effects. Continue your course of treatment even though you feel ill unless your doctor tells you to stop. In some cases, you may be given additional medicines to help with side effects. Follow all directions for their use. Call your doctor or  health care professional for advice if you get a fever, chills or sore throat, or other symptoms of a cold or flu. Do not treat yourself. This drug decreases your body's ability to fight infections. Try to avoid being around people who are sick. This medicine may increase your risk to bruise or bleed. Call your doctor or health care professional if you notice any unusual bleeding. You may need blood work done while you are taking this medicine. Do not become pregnant while taking this medicine and for 6 months after the last dose. Women should inform their doctor if they wish to become pregnant or think they might be pregnant. Men should not father a child while taking this medicine and for 3 months after the last dose. There is a potential for serious side effects to an unborn child. Talk to your health care professional or pharmacist for more information. Do not breast-feed an infant while taking this medicine and for 1 week after the last dose. This medicine may interfere with the ability to have a child. Talk with your doctor or health care professional if you are concerned about your fertility. What side effects may I notice from receiving this medicine? Side effects that you should report to your doctor or health care professional as soon  as possible:  allergic reactions like skin rash, itching or hives, swelling of the face, lips, or tongue  low blood counts - this medicine may decrease the number of white blood cells, red blood cells and platelets. You may be at increased risk for infections and bleeding.  signs of infection - fever or chills, cough, sore throat, pain passing urine  signs of decreased platelets or bleeding - bruising, pinpoint red spots on the skin, black, tarry stools, blood in the urine  signs of decreased red blood cells - unusually weak or tired, fainting spells, lightheadedness  signs and symptoms of kidney injury like trouble passing urine or change in the amount of  urine  signs and symptoms of liver injury like dark yellow or brown urine; general ill feeling or flu-like symptoms; light-colored stools; loss of appetite; nausea; right upper belly pain; unusually weak or tired; yellowing of the eyes or skin Side effects that usually do not require medical attention (report to your doctor or health care professional if they continue or are bothersome):  constipation  diarrhea  nausea, vomiting  pain or redness at the injection site  unusually weak or tired This list may not describe all possible side effects. Call your doctor for medical advice about side effects. You may report side effects to FDA at 1-800-FDA-1088. Where should I keep my medicine? This drug is given in a hospital or clinic and will not be stored at home. NOTE: This sheet is a summary. It may not cover all possible information. If you have questions about this medicine, talk to your doctor, pharmacist, or health care provider.  2021 Elsevier/Gold Standard (2016-05-19 14:37:51)

## 2020-09-27 ENCOUNTER — Inpatient Hospital Stay: Payer: PPO

## 2020-09-27 VITALS — BP 124/69 | HR 73 | Temp 96.0°F | Resp 18

## 2020-09-27 DIAGNOSIS — Z5111 Encounter for antineoplastic chemotherapy: Secondary | ICD-10-CM | POA: Diagnosis not present

## 2020-09-27 DIAGNOSIS — C9200 Acute myeloblastic leukemia, not having achieved remission: Secondary | ICD-10-CM

## 2020-09-27 MED ORDER — ONDANSETRON HCL 4 MG PO TABS
8.0000 mg | ORAL_TABLET | Freq: Once | ORAL | Status: AC
  Administered 2020-09-27: 8 mg via ORAL
  Filled 2020-09-27: qty 2

## 2020-09-27 MED ORDER — AZACITIDINE CHEMO SQ INJECTION
50.0000 mg/m2 | Freq: Once | INTRAMUSCULAR | Status: AC
  Administered 2020-09-27: 87.5 mg via SUBCUTANEOUS
  Filled 2020-09-27: qty 3.5

## 2020-09-27 NOTE — Progress Notes (Signed)
Patient tolerated Vidaza chemo injection today, no concerns voiced. Patient discharged, stable.

## 2020-09-27 NOTE — Patient Instructions (Signed)
[facility] ONCOLOGY  Discharge Instructions: Thank you for choosing [facility] to provide your oncology and hematology care.  If you have a lab appointment with the [facility], please go directly to the [facility] and check in at the registration area.  Wear comfortable clothing and clothing appropriate for easy access to any Portacath or PICC line.   We strive to give you quality time with your provider. You may need to reschedule your appointment if you arrive late (15 or more minutes).  Arriving late affects you and other patients whose appointments are after yours.  Also, if you miss three or more appointments without notifying the office, you may be dismissed from the clinic at the provider's discretion.      For prescription refill requests, have your pharmacy contact our office and allow 72 hours for refills to be completed.    Today you received the following chemotherapy and/or immunotherapy agents:Vidaza   To help prevent nausea and vomiting after your treatment, we encourage you to take your nausea medication as directed.  BELOW ARE SYMPTOMS THAT SHOULD BE REPORTED IMMEDIATELY: . *FEVER GREATER THAN 100.4 F (38 C) OR HIGHER . *CHILLS OR SWEATING . *NAUSEA AND VOMITING THAT IS NOT CONTROLLED WITH YOUR NAUSEA MEDICATION . *UNUSUAL SHORTNESS OF BREATH . *UNUSUAL BRUISING OR BLEEDING . *URINARY PROBLEMS (pain or burning when urinating, or frequent urination) . *BOWEL PROBLEMS (unusual diarrhea, constipation, pain near the anus) . TENDERNESS IN MOUTH AND THROAT WITH OR WITHOUT PRESENCE OF ULCERS (sore throat, sores in mouth, or a toothache) . UNUSUAL RASH, SWELLING OR PAIN  . UNUSUAL VAGINAL DISCHARGE OR ITCHING   Items with * indicate a potential emergency and should be followed up as soon as possible or go to the Emergency Department if any problems should occur.  Please show the CHEMOTHERAPY ALERT CARD or IMMUNOTHERAPY ALERT CARD  at check-in to the Emergency Department and triage nurse.  Should you have questions after your visit or need to cancel or reschedule your appointment, please contact [facility]  [phone] and follow the prompts.  Office hours are 8:00 a.m. to 4:30 p.m. Monday - Friday. Please note that voicemails left after 4:00 p.m. may not be returned until the following business day.  We are closed weekends and major holidays. You have access to a nurse at all times for urgent questions. Please call the main number to the clinic [phone] and follow the prompts.  For any non-urgent questions, you may also contact your provider using MyChart. We now offer e-Visits for anyone 50 and older to request care online for non-urgent symptoms. For details visit [email or web address].   Also download the MyChart app! Go to the app store, search "MyChart", open the app, select [facility], and log in with your MyChart username and password.  Due to Covid, a mask is required upon entering the hospital/clinic. If you do not have a mask, one will be given to you upon arrival. For doctor visits, patients may have 1 support person aged 85 or older with them. For treatment visits, patients cannot have anyone with them due to current Covid guidelines and our immunocompromised population.

## 2020-09-30 ENCOUNTER — Encounter: Payer: Self-pay | Admitting: Oncology

## 2020-10-02 ENCOUNTER — Encounter: Payer: Self-pay | Admitting: Physician Assistant

## 2020-10-02 ENCOUNTER — Ambulatory Visit (INDEPENDENT_AMBULATORY_CARE_PROVIDER_SITE_OTHER): Payer: PPO | Admitting: Physician Assistant

## 2020-10-02 ENCOUNTER — Other Ambulatory Visit: Payer: Self-pay

## 2020-10-02 VITALS — BP 104/62 | HR 96 | Ht 64.5 in | Wt 129.0 lb

## 2020-10-02 DIAGNOSIS — E039 Hypothyroidism, unspecified: Secondary | ICD-10-CM | POA: Diagnosis not present

## 2020-10-02 DIAGNOSIS — Z8679 Personal history of other diseases of the circulatory system: Secondary | ICD-10-CM

## 2020-10-02 DIAGNOSIS — R7989 Other specified abnormal findings of blood chemistry: Secondary | ICD-10-CM | POA: Diagnosis not present

## 2020-10-02 DIAGNOSIS — R002 Palpitations: Secondary | ICD-10-CM

## 2020-10-02 DIAGNOSIS — Z8639 Personal history of other endocrine, nutritional and metabolic disease: Secondary | ICD-10-CM

## 2020-10-02 NOTE — Patient Instructions (Signed)
Medication Instructions:  Please continue your current medications  *If you need a refill on your cardiac medications before your next appointment, please call your pharmacy*   Lab Work: TSH & PreT4 (hormone panel)  Testing/Procedures: None   Follow-Up: At [facility], you and your health needs are our priority.  As part of our continuing mission to provide you with exceptional heart care, we have created designated Provider Care Teams.  These Care Teams include your primary Cardiologist (physician) and Advanced Practice Providers (APPs -  Physician Assistants and Nurse Practitioners) who all work together to provide you with the care you need, when you need it.   Your next appointment:   3 month(s)  The format for your next appointment:   In Person  Provider:   Kathlyn [place], MD or Marrianne Mood, PA-C   Other Instructions Please monitor blood pressures and keep a log of your readings.  How to use a home blood pressure monitor. . Be still. . Measure at the same time every day. It's important to take the readings at the same time each day, such as morning and evening. Take reading approximately 1 1/2 to 2 hours after BP medications.   AVOID these things for 30 minutes before checking your blood pressure:  Drinking caffeine.  Drinking alcohol.  Eating.  Smoking.  Exercising.   Five minutes before checking your blood pressure:  Pee.  Sit in a dining chair. Avoid sitting in a soft couch or armchair.  Be quiet. Do not talk.       Sit correctly. Sit with your back straight and supported (on a dining chair, rather than a sofa). Your feet should be flat on the floor and your legs should not be crossed. Your arm should be supported on a flat surface (such as a table) with the upper arm at heart level. Make sure the bottom of the cuff is placed directly above the bend of the elbow.

## 2020-10-02 NOTE — Progress Notes (Signed)
Office Visit    Patient Name: Debra Hale Date of Encounter: 10/02/2020  PCP:  [name], [place]  Cardiologist:  Kathlyn Sacramento, MD  Advanced Practice Provider:  No care team member to display Electrophysiologist:  None :[id number]}   Chief Complaint    Chief Complaint  Patient presents with  . Other    Follow up post ECHO and ZIO - Patient c/o having a few palpitations but she states " I am very nervous." Meds reviewed verbally with patient.     70 y.o. female with history of AML, recurrent pericarditis, hyperlipidemia, thyroid carcinoma s/p surgery and XRT, colon cancer s/p laparoscopic right colectomy, nephrolithiasis, anxiety, breast cancer, osteoporosis, palpitations in the setting of hypokalemia/hypomagnesia, and who presents today for follow-up of echo and Zio with Zio monitor still pending/not yet mailed into the [place] monitoring service.  Past Medical History    Past Medical History:  Diagnosis Date  . Anxiety   . Arthritis    Osteoarthritis  . Breast cancer ([place]) 2009   right breast lumpectomy with rad tx  . Colon cancer ([place])    surgery with chemo and rad tx  . Complication of anesthesia   . GERD (gastroesophageal reflux disease)   . Heart palpitations   . History of hiatal hernia   . History of kidney stones   . Hypothyroidism   . Liver disease   . Liver nodule    s/p negative biopsy  . Malignant neoplasm of thyroid gland ([place]) 2002   s/p surgery and XRT  . Osteoporosis   . Other and unspecified hyperlipidemia   . Palpitations   . Personal history of chemotherapy   . Personal history of malignant neoplasm of large intestine    carcinoma - cecum, s/p right laparoscopic colectomy - s/p chemotherapy and XRT  . Personal history of radiation therapy   . Pneumonia 2019  . PONV (postoperative nausea and vomiting)   . Pure hypercholesterolemia   . Unspecified hereditary and idiopathic peripheral neuropathy    Past  Surgical History:  Procedure Laterality Date  . APPENDECTOMY  1985  . BREAST BIOPSY Right 2009   positive  . BREAST BIOPSY Right 2009   negative  . BREAST LUMPECTOMY Right 2009   breast cancer  . CHOLECYSTECTOMY  1995  . COLONOSCOPY    . COLONOSCOPY WITH PROPOFOL N/A 10/29/2017   Procedure: COLONOSCOPY WITH PROPOFOL;  Surgeon: Manya Silvas, MD;  Location: [facility] ENDOSCOPY;  Service: Endoscopy;  Laterality: N/A;  . DILATION AND CURETTAGE OF UTERUS  1990  . DILATION AND CURETTAGE, DIAGNOSTIC / THERAPEUTIC  1990  . ESOPHAGOGASTRODUODENOSCOPY    . ESOPHAGOGASTRODUODENOSCOPY (EGD) WITH PROPOFOL N/A 10/29/2017   Procedure: ESOPHAGOGASTRODUODENOSCOPY (EGD) WITH PROPOFOL;  Surgeon: Manya Silvas, MD;  Location: [facility] ENDOSCOPY;  Service: Endoscopy;  Laterality: N/A;  . INCISION AND DRAINAGE PERIRECTAL ABSCESS N/A 06/17/2020   Procedure: IRRIGATION AND DEBRIDEMENT PERIRECTAL ABSCESS;  Surgeon: Jules Husbands, MD;  Location: [facility] ORS;  Service: General;  Laterality: N/A;  . LAPAROSCOPIC PARTIAL COLECTOMY     stage 3-C carcinoma of the cecum, s/p chemotherapy and xrt  . LITHOTRIPSY    . SIGMOIDOSCOPY  08/26/1993  . THYROID LOBECTOMY  2002   s/p XRT  . TOTAL HIP ARTHROPLASTY Right 10/24/2019   Procedure: TOTAL HIP ARTHROPLASTY;  Surgeon: Corky Mull, MD;  Location: [facility] ORS;  Service: Orthopedics;  Laterality: Right;    Allergies  Allergies  Allergen Reactions  .  Demeclocycline Other (See Comments)    Throat swells  . Sulfa Antibiotics     Other reaction(s): Other (See Comments) Throat swells  . Tetracyclines & Related Other (See Comments)    Throat swells   . Augmentin [Amoxicillin-Pot Clavulanate] Diarrhea  . Bentyl [Dicyclomine Hcl]     unkn  . Ciprofloxacin Diarrhea  . Codeine Other (See Comments)    dizziness    . Epinephrine     Speeds heart up - panic   . Flagyl [Metronidazole] Nausea And Vomiting  . Librax [Chlordiazepoxide-Clidinium]     unkn  . Novocain  [Procaine] Other (See Comments)    "Shaky"  . Phenobarbital     unkn  . Prednisone     Nervous    . Ultram [Tramadol] Other (See Comments)    Sick feeling    History of Present Illness    Debra Hale is a 70 y.o. female with PMH as above.  She has history of AML, recurrent pericarditis, hyperlipidemia, thyroid Carcinoma s/p surgery and XRT, colon cancer s/p laparoscopic right colectomy, nephrolithiasis, anxiety, osteoporosis, breast cancer, palpitations in the setting of hypokalemia /hypomagnesia.  Previous 2017 treadmill stress test without evidence of ischemia with poor exercise tolerance and hypertensive response to exercise.  She has had recurrent pericarditis and previous small pericardial effusion since 2017.  She had suspected pericarditis in the setting of upper respiratory infection in 2020.  Sedimentation rate of 70.  Echo 05/2019 with LVEF 50 to 55% and no pericardial effusion.  She was treated with colchicine and NSAIDs.  Seen 09/2019 for preoperative evaluation and recommendation for [facility], which was ruled a low risk study without evidence of ischemia.  She was seen by her primary cardiologist 03/2020 with minimal palpitations and work-up deferred.  She was admitted 06/2020 due to perirectal abscess.  Her ventoxlax for AML was discontinued and transition to [place].  March 2022 lab work showed ongoing hypokalemia with potassium 3.6.    She was last seen in office 08/09/2020 and reported increasing palpitations over the last month.  She reported her stress level is about the same as previous visits.  She did report that everything was stressful.  She was drinking water throughout the day, estimated at approximately 1.516 ounce bottles of water.  Palpitations were reportedly short-lived and most often a daily occurrence.  They were more noticeable when she laid on her left side.  She reported dyspnea on exertion, attributed to sedentary lifestyle/inactivity.  Recommendation was to  take Lopressor 12.5 mg twice daily as needed for palpitations.  14-day ZIO XT monitor was ordered, as well as an echocardiogram.  She was encouraged to stay well-hydrated, given she was only drinking 1.516 ounce bottles of water per day.  09/05/2020 echo showed LVEF 60 to 65%, and R WMA, G1 DD, RVSP 32.9 mmHg, estimated RAP 3 mmHg. Zio monitor not yet mailed in as outlined below.  Today, 10/02/2020, she returns to clinic to review both echo and ZIO XT monitor results.  Unfortunately, she has not yet mailed in her Zio XT monitor.  She brings it with her to clinic with recommendation to mail to the company in [place], so that it can be uploaded properly and reviewed at that time by her primary cardiologist with patient understanding.  Echo results reviewed in detail with patient understanding.  She feels as if her palpitations are related to caffeine and reports that she even notices more palpitations when drinking a coffee made of 3/4 water and 1/4  decaf coffee. She is trying to hydrate, encouraged given the heat today. She reports that her palpitations have actually decreased since her previous clinic visit.  On further discussion, she reports a history of loose bowels her entire life, worse with her leukemia.  We discussed that this sometimes can result in electrolyte imbalances, which could have contributed to her palpitations, especially with her history of hypokalemia.  She continues to note that her palpitations are worse when she lays on her left side, so she avoids this position.  No recent chest pain.  She continues to report some element of deconditioning with stable DOE as noted in the past.  No presyncope or syncope.  No other signs or symptoms of volume overload. She notes concerned that her cardiac monitoring was not done appropriately, which is the reason that she had not mailed in her monitor yet - she will mail it today.  She wonders if she had COVID in the past, and if this could contribute to  her sx. she reports that she has not taken any of her Lopressor 12.5 mg twice daily, which was prescribed at her previous clinic visit.  She has not needed it for her palpitations.  Home Medications   Current Outpatient Medications  Medication Instructions  . clonazePAM (KLONOPIN) 1.5 mg, Oral, 2 times daily  . diphenoxylate-atropine (LOMOTIL) 2.5-0.025 MG tablet 1 tablet, Oral, 4 times daily PRN  . levothyroxine (SYNTHROID) 125 MCG tablet TAKE 1 TABLET EVERY DAY ON EMPTY STOMACHWITH A GLASS OF WATER AT LEAST 30-60 MINBEFORE BREAKFAST  . Lexapro 20 mg, Oral, BH-each morning  . metoprolol tartrate (LOPRESSOR) 25 mg, Oral, As needed  . pantoprazole (PROTONIX) 40 MG tablet TAKE 1 TABLET BY MOUTH EVERY DAY  . valACYclovir (VALTREX) 500 MG tablet TAKE 1 TABLET BY MOUTH DAILY.  Marland Kitchen venetoclax (VENCLEXTA) 400 mg, Oral, Daily, Take for 14 days, then hold for 14 days. Repeat every 28 days. Take with a meal and water.     Review of Systems    She reports improved palpitations and stable DOE. She denies chest pain pnd, orthopnea, syncope, edema, weight gain, or early satiety.   All other systems reviewed and are otherwise negative except as noted above.  Physical Exam    VS:  BP 104/62 (BP Location: Left Arm, Patient Position: Sitting, Cuff Size: Normal)   Pulse 96   Ht 5' 4.5" (1.638 m)   Wt 129 lb (58.5 kg)   SpO2 96%   BMI 21.80 kg/m  , BMI Body mass index is 21.8 kg/m. GEN: Well nourished, well developed, in no acute distress. HEENT: normal. Neck: Supple, no JVD, carotid bruits, or masses. Cardiac: RRR, no murmurs, rubs, or gallops. No clubbing, cyanosis, edema.  Radials/DP/PT 2+ and equal bilaterally.  Respiratory:  Respirations regular and unlabored, clear to auscultation bilaterally. GI: Soft, nontender, nondistended, BS + x 4. MS: no deformity or atrophy. Skin: warm and dry, no rash. Neuro:  Strength and sensation are intact. Psych: Normal affect.  Accessory Clinical Findings     ECG personally reviewed by me today -NSR, 96bpm, nonspecific changes as noted on prior EKGs  - no acute changes.  VITALS Reviewed today   Temp Readings from Last 3 Encounters:  09/27/20 (!) 96 F (35.6 C) (Tympanic)  09/26/20 (!) 97.5 F (36.4 C) (Tympanic)  09/25/20 98.6 F (37 C) (Tympanic)   BP Readings from Last 3 Encounters:  10/02/20 104/62  09/27/20 124/69  09/26/20 138/72   Pulse Readings from Last 3  Encounters:  10/02/20 96  09/27/20 73  09/26/20 72    Wt Readings from Last 3 Encounters:  10/02/20 129 lb (58.5 kg)  09/24/20 128 lb 3.2 oz (58.2 kg)  08/30/20 127 lb 9.6 oz (57.9 kg)     LABS  reviewed today   Lab Results  Component Value Date   WBC 1.5 (L) 09/23/2020   HGB 12.9 09/23/2020   HCT 38.0 09/23/2020   MCV 81.7 09/23/2020   PLT 168 09/23/2020   Lab Results  Component Value Date   CREATININE 0.84 09/23/2020   BUN 17 09/23/2020   NA 142 09/23/2020   K 3.8 09/23/2020   CL 107 09/23/2020   CO2 24 09/23/2020   Lab Results  Component Value Date   ALT 24 09/23/2020   AST 15 09/23/2020   ALKPHOS 113 09/23/2020   BILITOT 0.8 09/23/2020   Lab Results  Component Value Date   CHOL 163 02/20/2020   HDL 40.70 02/20/2020   LDLCALC 100 (H) 02/20/2020   LDLDIRECT 126.0 07/11/2015   TRIG 110.0 02/20/2020   CHOLHDL 4 02/20/2020    Lab Results  Component Value Date   HGBA1C 5.3 12/10/2016   Lab Results  Component Value Date   TSH 0.093 (L) 06/16/2020     STUDIES/PROCEDURES reviewed today   ETT 04/2016  Blood pressure demonstrated a hypertensive response to exercise.  There was no ST segment deviation noted during stress.  No T wave inversion was noted during stress.  Normal treadmill stress test with no evidence of ischemia. Poor exercise capacity with hypertensive response to exercise  Echo 05/2018 - Left ventricle: The cavity size was normal. There was mild  concentric hypertrophy. Systolic function was normal. The   estimated ejection fraction was in the range of 50% to 55%. Wall  motion was normal; there were no regional wall motion  abnormalities. Doppler parameters are consistent with abnormal  left ventricular relaxation (grade 1 diastolic dysfunction).  - Pericardium, extracardiac: There was no pericardial effusion.   Echo 09/05/20 1. Left ventricular ejection fraction, by estimation, is 60 to 65%. The  left ventricle has normal function. The left ventricle has no regional  wall motion abnormalities. Left ventricular diastolic parameters are  consistent with Grade I diastolic  dysfunction (impaired relaxation).  2. Right ventricular systolic function is normal. The right ventricular  size is normal. There is normal pulmonary artery systolic pressure. The  estimated right ventricular systolic pressure is 70.9 mmHg.  3. The inferior vena cava is normal in size with greater than 50%  respiratory variability, suggesting right atrial pressure of 3 mmHg.   Assessment & Plan    Palpitations, improving  History of hypokalemia, hypomagnesia, abnormal TSH -- Reports decreased frequency and palpitations since her previous clinic visit.  Zio monitoring not yet mailed back with patient planning to mail back her Zio XT monitor today.  Results of echo discussed today with EF 60 to 65%, NR WMA, G1 DD, RS VP 32.9 mmHg.  BP today well controlled. She denies any s/sx of sleep apnea. Euvolemic on exam. She does have a history of hypokalemia and hypomagnesia with reported history of loose bowels (chronic, ongoing), as well as abnormal TSH on synthroid (see labs above).  We will recheck a TSH/BMET today to confirm WNL.  Continue current potassium and magnesium as prescribed per PCP.  EKG today without arrhythmia.  Continue Lopressor 12.5 mg twice daily as needed for palpitations, though she reports she has not yet needed to take  any of this medication. Further recommendations pending monitor as indicated.  Hydration encouraged, especially in the heat.  Recurrent pericarditis/pericardial effusion Diastolic dysfunction -- Denies any symptoms consistent with recurrent pericarditis.  G1DD stable with pt euvolemic on exam. She will let us know if any new or worsening sx. No indication for a diuretic at this time. No evidence of pericardial effusion/pericarditis - echo reviewed with patient understanding.  Medication changes: None Labs ordered: TSH, BMET Studies / Imaging ordered: None Future considerations: Zio results (still needs to mail back to [place]) Disposition: RTC 3 mo   *Please be aware that the above documentation was completed voice recognition software and may contain dictation errors.    Arvil Chaco, PA-C 10/02/2020

## 2020-10-03 ENCOUNTER — Inpatient Hospital Stay: Payer: PPO | Attending: Hospice and Palliative Medicine | Admitting: Hospice and Palliative Medicine

## 2020-10-03 DIAGNOSIS — Z853 Personal history of malignant neoplasm of breast: Secondary | ICD-10-CM | POA: Insufficient documentation

## 2020-10-03 DIAGNOSIS — C9200 Acute myeloblastic leukemia, not having achieved remission: Secondary | ICD-10-CM | POA: Diagnosis not present

## 2020-10-03 DIAGNOSIS — Z5111 Encounter for antineoplastic chemotherapy: Secondary | ICD-10-CM | POA: Insufficient documentation

## 2020-10-03 DIAGNOSIS — Z85038 Personal history of other malignant neoplasm of large intestine: Secondary | ICD-10-CM | POA: Insufficient documentation

## 2020-10-03 DIAGNOSIS — D701 Agranulocytosis secondary to cancer chemotherapy: Secondary | ICD-10-CM | POA: Insufficient documentation

## 2020-10-03 DIAGNOSIS — Z9221 Personal history of antineoplastic chemotherapy: Secondary | ICD-10-CM | POA: Insufficient documentation

## 2020-10-03 LAB — TSH: TSH: 0.034 u[IU]/mL — ABNORMAL LOW (ref 0.450–4.500)

## 2020-10-03 MED ORDER — VALACYCLOVIR HCL 500 MG PO TABS: 500.0000 mg | ORAL_TABLET | Freq: Every day | ORAL | 2 refills | Status: DC

## 2020-10-03 NOTE — Progress Notes (Signed)
Virtual Visit via Telephone Note  I connected with Debra Hale on 10/03/20 at  2:00 PM EDT by telephone and verified that I am speaking with the correct person using two identifiers.  Location: Patient: home Provider: clinic   I discussed the limitations, risks, security and privacy concerns of performing an evaluation and management service by telephone and the availability of in person appointments. I also discussed with the patient that there may be a patient responsible charge related to this service. The patient expressed understanding and agreed to proceed.   History of Present Illness: Debra Hale is a 70 y.o. 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  Patient was previously on venetoclax but developed perirectal abscess and so this was discontinued.  She has been symptomatic with progressive fatigue and poor oral intake. Palliative care was requested to help coordinate care and manage ongoing symptoms.    Observations/Objective: I called and spoke with patient by phone.  Patient reports that she is doing well.  She denies any significant changes or concerns.  She says her appetite is improved and she has been gaining weight.  Performance status is stable.  Patient did request refill of her valacyclovir.  This was sent by Dr. [name] on 5/23 but patient says that she did receive it from the pharmacy.  Assessment and Plan: AML -on current treatment with Vidaza.  Followed by Dr. [name].  Symptomatically, appears improved.    Follow Up Instructions: Follow-up MyChart visit in 2 months   I discussed the assessment and treatment plan with the patient. The patient was provided an opportunity to ask questions and all were answered. The patient agreed with the plan and demonstrated an understanding of the instructions.   The patient was advised to call back or seek an in-person evaluation if the symptoms worsen or if the condition fails  to improve as anticipated.  I provided 10 minutes of non-face-to-face time during this encounter.   Irean Hong, NP

## 2020-10-07 ENCOUNTER — Other Ambulatory Visit: Payer: Self-pay

## 2020-10-07 MED ORDER — LEVOTHYROXINE SODIUM 112 MCG PO TABS
112.0000 ug | ORAL_TABLET | Freq: Every day | ORAL | 1 refills | Status: DC
Start: 2020-10-07 — End: 2020-11-19

## 2020-10-08 ENCOUNTER — Inpatient Hospital Stay: Payer: PPO

## 2020-10-09 ENCOUNTER — Inpatient Hospital Stay: Payer: PPO

## 2020-10-09 DIAGNOSIS — Z853 Personal history of malignant neoplasm of breast: Secondary | ICD-10-CM | POA: Diagnosis not present

## 2020-10-09 DIAGNOSIS — C9200 Acute myeloblastic leukemia, not having achieved remission: Secondary | ICD-10-CM

## 2020-10-09 DIAGNOSIS — Z9221 Personal history of antineoplastic chemotherapy: Secondary | ICD-10-CM | POA: Diagnosis not present

## 2020-10-09 DIAGNOSIS — Z5111 Encounter for antineoplastic chemotherapy: Secondary | ICD-10-CM | POA: Diagnosis not present

## 2020-10-09 DIAGNOSIS — Z85038 Personal history of other malignant neoplasm of large intestine: Secondary | ICD-10-CM | POA: Diagnosis not present

## 2020-10-09 DIAGNOSIS — D701 Agranulocytosis secondary to cancer chemotherapy: Secondary | ICD-10-CM | POA: Diagnosis not present

## 2020-10-09 LAB — COMPREHENSIVE METABOLIC PANEL
ALT: 23 U/L (ref 0–44)
AST: 18 U/L (ref 15–41)
Albumin: 3.6 g/dL (ref 3.5–5.0)
Alkaline Phosphatase: 121 U/L (ref 38–126)
Anion gap: 9 (ref 5–15)
BUN: 14 mg/dL (ref 8–23)
CO2: 23 mmol/L (ref 22–32)
Calcium: 8.7 mg/dL — ABNORMAL LOW (ref 8.9–10.3)
Chloride: 109 mmol/L (ref 98–111)
Creatinine, Ser: 0.8 mg/dL (ref 0.44–1.00)
GFR, Estimated: >60 mL/min (ref >60)
Glucose, Bld: 105 mg/dL — ABNORMAL HIGH (ref 70–99)
Potassium: 3.5 mmol/L (ref 3.5–5.1)
Sodium: 141 mmol/L (ref 135–145)
Total Bilirubin: 0.5 mg/dL (ref 0.3–1.2)
Total Protein: 6.6 g/dL (ref 6.5–8.1)

## 2020-10-09 LAB — CBC WITH DIFFERENTIAL/PLATELET
Abs Immature Granulocytes: 0.01 10*3/uL (ref 0.00–0.07)
Basophils Absolute: 0 10*3/uL (ref 0.0–0.1)
Basophils Relative: 0 %
Eosinophils Absolute: 0 10*3/uL (ref 0.0–0.5)
Eosinophils Relative: 0 %
HCT: 38.4 % (ref 36.0–46.0)
Hemoglobin: 13 g/dL (ref 12.0–15.0)
Immature Granulocytes: 1 %
Lymphocytes Relative: 39 %
Lymphs Abs: 0.6 10*3/uL — ABNORMAL LOW (ref 0.7–4.0)
MCH: 27.7 pg (ref 26.0–34.0)
MCHC: 33.9 g/dL (ref 30.0–36.0)
MCV: 81.9 fL (ref 80.0–100.0)
Monocytes Absolute: 0.3 10*3/uL (ref 0.1–1.0)
Monocytes Relative: 17 %
Neutro Abs: 0.7 10*3/uL — ABNORMAL LOW (ref 1.7–7.7)
Neutrophils Relative %: 43 %
Platelets: 136 10*3/uL — ABNORMAL LOW (ref 150–400)
RBC: 4.69 MIL/uL (ref 3.87–5.11)
RDW: 14.6 % (ref 11.5–15.5)
WBC: 1.6 10*3/uL — ABNORMAL LOW (ref 4.0–10.5)
nRBC: 0 % (ref 0.0–0.2)

## 2020-10-09 LAB — MAGNESIUM: Magnesium: 1.6 mg/dL — ABNORMAL LOW (ref 1.7–2.4)

## 2020-10-17 ENCOUNTER — Ambulatory Visit
Admit: 2020-10-17 | Discharge: 2020-10-18 | Payer: PRIVATE HEALTH INSURANCE | Attending: Hematology | Primary: Hematology

## 2020-10-17 ENCOUNTER — Other Ambulatory Visit: Admit: 2020-10-17 | Discharge: 2020-10-18 | Payer: PRIVATE HEALTH INSURANCE

## 2020-10-17 DIAGNOSIS — C9200 Acute myeloblastic leukemia, not having achieved remission: Secondary | ICD-10-CM | POA: Diagnosis not present

## 2020-10-17 DIAGNOSIS — C9201 Acute myeloblastic leukemia, in remission: Secondary | ICD-10-CM | POA: Diagnosis not present

## 2020-10-17 MED ORDER — VALACYCLOVIR 500 MG TABLET
ORAL_TABLET | Freq: Every day | ORAL | 0 refills | 90 days | Status: CP
Start: 2020-10-17 — End: ?

## 2020-10-17 MED FILL — VENCLEXTA 100 MG TABLET: 28 days supply | Qty: 56 | Fill #1

## 2020-10-21 ENCOUNTER — Inpatient Hospital Stay: Payer: PPO

## 2020-10-21 VITALS — BP 121/69 | HR 79 | Temp 97.0°F | Resp 19

## 2020-10-21 DIAGNOSIS — C9200 Acute myeloblastic leukemia, not having achieved remission: Secondary | ICD-10-CM

## 2020-10-21 DIAGNOSIS — Z5111 Encounter for antineoplastic chemotherapy: Secondary | ICD-10-CM | POA: Diagnosis not present

## 2020-10-21 LAB — COMPREHENSIVE METABOLIC PANEL
ALT: 27 U/L (ref 0–44)
AST: 17 U/L (ref 15–41)
Albumin: 3.7 g/dL (ref 3.5–5.0)
Alkaline Phosphatase: 117 U/L (ref 38–126)
Anion gap: 10 (ref 5–15)
BUN: 24 mg/dL — ABNORMAL HIGH (ref 8–23)
CO2: 24 mmol/L (ref 22–32)
Calcium: 8.9 mg/dL (ref 8.9–10.3)
Chloride: 106 mmol/L (ref 98–111)
Creatinine, Ser: 0.95 mg/dL (ref 0.44–1.00)
GFR, Estimated: >60 mL/min (ref >60)
Glucose, Bld: 99 mg/dL (ref 70–99)
Potassium: 3.6 mmol/L (ref 3.5–5.1)
Sodium: 140 mmol/L (ref 135–145)
Total Bilirubin: 0.7 mg/dL (ref 0.3–1.2)
Total Protein: 6.4 g/dL — ABNORMAL LOW (ref 6.5–8.1)

## 2020-10-21 LAB — SAMPLE TO BLOOD BANK

## 2020-10-21 LAB — CBC WITH DIFFERENTIAL/PLATELET
Abs Immature Granulocytes: 0 10*3/uL (ref 0.00–0.07)
Basophils Absolute: 0 10*3/uL (ref 0.0–0.1)
Basophils Relative: 1 %
Eosinophils Absolute: 0 10*3/uL (ref 0.0–0.5)
Eosinophils Relative: 1 %
HCT: 38.9 % (ref 36.0–46.0)
Hemoglobin: 13 g/dL (ref 12.0–15.0)
Immature Granulocytes: 0 %
Lymphocytes Relative: 41 %
Lymphs Abs: 0.6 10*3/uL — ABNORMAL LOW (ref 0.7–4.0)
MCH: 27.4 pg (ref 26.0–34.0)
MCHC: 33.4 g/dL (ref 30.0–36.0)
MCV: 82.1 fL (ref 80.0–100.0)
Monocytes Absolute: 0.1 10*3/uL (ref 0.1–1.0)
Monocytes Relative: 5 %
Neutro Abs: 0.8 10*3/uL — ABNORMAL LOW (ref 1.7–7.7)
Neutrophils Relative %: 52 %
Platelets: 159 10*3/uL (ref 150–400)
RBC: 4.74 MIL/uL (ref 3.87–5.11)
RDW: 15 % (ref 11.5–15.5)
WBC: 1.5 10*3/uL — ABNORMAL LOW (ref 4.0–10.5)
nRBC: 0 % (ref 0.0–0.2)

## 2020-10-21 LAB — MAGNESIUM: Magnesium: 1.7 mg/dL (ref 1.7–2.4)

## 2020-10-21 MED ORDER — ONDANSETRON HCL 4 MG PO TABS
8.0000 mg | ORAL_TABLET | Freq: Once | ORAL | Status: AC
  Administered 2020-10-21: 8 mg via ORAL
  Filled 2020-10-21: qty 2

## 2020-10-21 MED ORDER — AZACITIDINE CHEMO SQ INJECTION
50.0000 mg/m2 | Freq: Once | INTRAMUSCULAR | Status: AC
  Administered 2020-10-21: 87.5 mg via SUBCUTANEOUS
  Filled 2020-10-21: qty 3.5

## 2020-10-21 NOTE — Patient Instructions (Signed)
[facility]   Discharge Instructions: Thank you for choosing [facility] to provide your oncology and hematology care.  If you have a lab appointment with the [facility], please go directly to the [facility] and check in at the registration area.  Wear comfortable clothing and clothing appropriate for easy access to any Portacath or PICC line.   We strive to give you quality time with your provider. You may need to reschedule your appointment if you arrive late (15 or more minutes).  Arriving late affects you and other patients whose appointments are after yours.  Also, if you miss three or more appointments without notifying the office, you may be dismissed from the clinic at the provider's discretion.      For prescription refill requests, have your pharmacy contact our office and allow 72 hours for refills to be completed.    Today you received the following chemotherapy and/or immunotherapy agents Vidaza   To help prevent nausea and vomiting after your treatment, we encourage you to take your nausea medication as directed.  BELOW ARE SYMPTOMS THAT SHOULD BE REPORTED IMMEDIATELY: *FEVER GREATER THAN 100.4 F (38 C) OR HIGHER *CHILLS OR SWEATING *NAUSEA AND VOMITING THAT IS NOT CONTROLLED WITH YOUR NAUSEA MEDICATION *UNUSUAL SHORTNESS OF BREATH *UNUSUAL BRUISING OR BLEEDING *URINARY PROBLEMS (pain or burning when urinating, or frequent urination) *BOWEL PROBLEMS (unusual diarrhea, constipation, pain near the anus) TENDERNESS IN MOUTH AND THROAT WITH OR WITHOUT PRESENCE OF ULCERS (sore throat, sores in mouth, or a toothache) UNUSUAL RASH, SWELLING OR PAIN  UNUSUAL VAGINAL DISCHARGE OR ITCHING   Items with * indicate a potential emergency and should be followed up as soon as possible or go to the Emergency Department if any problems should occur.  Please show the CHEMOTHERAPY ALERT CARD or IMMUNOTHERAPY ALERT CARD at check-in to  the Emergency Department and triage nurse.  Should you have questions after your visit or need to cancel or reschedule your appointment, please contact [facility]  [phone] and follow the prompts.  Office hours are 8:00 a.m. to 4:30 p.m. Monday - Friday. Please note that voicemails left after 4:00 p.m. may not be returned until the following business day.  We are closed weekends and major holidays. You have access to a nurse at all times for urgent questions. Please call the main number to the clinic [phone] and follow the prompts.  For any non-urgent questions, you may also contact your provider using MyChart. We now offer e-Visits for anyone 60 and older to request care online for non-urgent symptoms. For details visit [email or web address].   Also download the MyChart app! Go to the app store, search "MyChart", open the app, select [facility], and log in with your MyChart username and password.  Due to Covid, a mask is required upon entering the hospital/clinic. If you do not have a mask, one will be given to you upon arrival. For doctor visits, patients may have 1 support person aged 94 or older with them. For treatment visits, patients cannot have anyone with them due to current Covid guidelines and our immunocompromised population.

## 2020-10-22 ENCOUNTER — Encounter: Payer: Self-pay | Admitting: Oncology

## 2020-10-22 ENCOUNTER — Inpatient Hospital Stay: Payer: PPO

## 2020-10-22 ENCOUNTER — Inpatient Hospital Stay (HOSPITAL_BASED_OUTPATIENT_CLINIC_OR_DEPARTMENT_OTHER): Payer: PPO | Admitting: Oncology

## 2020-10-22 ENCOUNTER — Other Ambulatory Visit: Payer: Self-pay

## 2020-10-22 ENCOUNTER — Inpatient Hospital Stay: Payer: PPO | Admitting: Pharmacist

## 2020-10-22 VITALS — BP 122/68 | HR 82 | Temp 97.7°F | Resp 18 | Ht 64.5 in | Wt 128.5 lb

## 2020-10-22 DIAGNOSIS — C9200 Acute myeloblastic leukemia, not having achieved remission: Secondary | ICD-10-CM

## 2020-10-22 DIAGNOSIS — Z5111 Encounter for antineoplastic chemotherapy: Secondary | ICD-10-CM | POA: Diagnosis not present

## 2020-10-22 MED ORDER — ONDANSETRON HCL 4 MG PO TABS
8.0000 mg | ORAL_TABLET | Freq: Once | ORAL | Status: AC
  Administered 2020-10-22: 8 mg via ORAL
  Filled 2020-10-22: qty 2

## 2020-10-22 MED ORDER — AZACITIDINE CHEMO SQ INJECTION
50.0000 mg/m2 | Freq: Once | INTRAMUSCULAR | Status: AC
  Administered 2020-10-22: 87.5 mg via SUBCUTANEOUS
  Filled 2020-10-22: qty 3.5

## 2020-10-22 NOTE — Progress Notes (Signed)
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 Currently treatment with azacitidine and venetoclax. Venetoclax therapy was resumed on Monday 09/23/20.  Reason for Consult: Oral chemotherapy follow-up for venetoclax therapy.   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   s/p surgery and XRT   Osteoporosis    Other and unspecified hyperlipidemia    Palpitations    Personal history of chemotherapy    Personal history of malignant neoplasm of large intestine    carcinoma - cecum, s/p right laparoscopic colectomy - s/p chemotherapy and XRT   Personal history of radiation therapy    Pneumonia 2019   PONV (postoperative nausea and vomiting)    Pure hypercholesterolemia    Unspecified hereditary and idiopathic peripheral neuropathy     HEMATOLOGY/ONCOLOGY HISTORY:  Oncology History  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[place] [facility])  04/12/2020 Initial Diagnosis   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[place])    06/10/2020 -  Chemotherapy    Patient is on Treatment Plan: AML DOSE REDUCED AZACITIDINE SQ D1-5 Q28D         ALLERGIES:  is allergic to demeclocycline, sulfa antibiotics, tetracyclines & related, augmentin  [amoxicillin-pot clavulanate], bentyl [dicyclomine hcl], ciprofloxacin, codeine, epinephrine, flagyl [metronidazole], librax [chlordiazepoxide-clidinium], novocain [procaine], phenobarbital, prednisone, and ultram [tramadol].  MEDICATIONS:  Current Outpatient Medications  Medication Sig Dispense Refill   clonazePAM (KLONOPIN) 0.5 MG tablet Take 1.5 mg by mouth 2 (two) times daily.   0   diphenoxylate-atropine (LOMOTIL) 2.5-0.025 MG tablet Take 1 tablet by mouth 4 (four) times daily as needed for diarrhea or loose stools. 120 tablet 0   escitalopram (LEXAPRO) 20 MG tablet Take 20 mg by mouth every morning.      levothyroxine (SYNTHROID) 112 MCG tablet Take 1 tablet (112 mcg total) by mouth daily before breakfast. 30 tablet 1   metoprolol tartrate (LOPRESSOR) 25 MG tablet TAKE 1 TABLET (25 MG TOTAL) BY MOUTH AS NEEDED (AS NEEDED UP TO TWICE DAILY FOR PALPITATIONS). 60 tablet 1   pantoprazole (PROTONIX) 40 MG tablet TAKE 1 TABLET BY MOUTH EVERY DAY 90 tablet 2   valACYclovir (VALTREX) 500 MG tablet Take 1 tablet (500 mg total) by mouth daily. 30 tablet 2   venetoclax (VENCLEXTA) 100 MG tablet Take 400 mg by mouth daily. Take for 14 days, then hold for 14 days. Repeat every 28 days. Take with a meal and water.     Current Facility-Administered Medications  Medication Dose Route Frequency Provider Last Rate Last Admin   ondansetron (ZOFRAN) tablet 8 mg  8 mg Oral Once Guse, Jacquelynn Cree, FNP  Facility-Administered Medications Ordered in Other Visits  Medication Dose Route Frequency Provider Last Rate Last Admin   potassium chloride (KLOR-CON) CR tablet 40 mEq  40 mEq Oral BID Lloyd Huger, MD   40 mEq at 07/08/20 1136    VITAL SIGNS: There were no vitals taken for this visit. There were no vitals filed for this visit.  Estimated body mass index is 21.72 kg/m as calculated from the following:   Height as of an earlier encounter on 10/22/20: 5' 4.5" (1.638 m).   Weight as of an earlier  encounter on 10/22/20: 58.3 kg (128 lb 8 oz).  LABS: CBC:    Component Value Date/Time   WBC 1.5 (L) 10/21/2020 1329   HGB 13.0 10/21/2020 1329   HGB 12.5 05/09/2018 1147   HCT 38.9 10/21/2020 1329   HCT 36.8 05/09/2018 1147   PLT 159 10/21/2020 1329   PLT 285 05/09/2018 1147   MCV 82.1 10/21/2020 1329   MCV 86 05/09/2018 1147   MCV 89 11/30/2013 1419   NEUTROABS 0.8 (L) 10/21/2020 1329   NEUTROABS 2.9 11/20/2013 1404   LYMPHSABS 0.6 (L) 10/21/2020 1329   LYMPHSABS 0.9 (L) 11/20/2013 1404   MONOABS 0.1 10/21/2020 1329   MONOABS 0.2 11/20/2013 1404   EOSABS 0.0 10/21/2020 1329   EOSABS 0.1 11/20/2013 1404   BASOSABS 0.0 10/21/2020 1329   BASOSABS 0.0 11/20/2013 1404   Comprehensive Metabolic Panel:    Component Value Date/Time   NA 140 10/21/2020 1329   NA 143 05/09/2018 1147   NA 145 11/30/2013 1419   K 3.6 10/21/2020 1329   K 3.5 11/30/2013 1419   CL 106 10/21/2020 1329   CL 109 (H) 11/30/2013 1419   CO2 24 10/21/2020 1329   CO2 29 11/30/2013 1419   BUN 24 (H) 10/21/2020 1329   BUN 11 05/09/2018 1147   BUN 15 11/30/2013 1419   CREATININE 0.95 10/21/2020 1329   CREATININE 1.04 11/30/2013 1419   GLUCOSE 99 10/21/2020 1329   GLUCOSE 97 11/30/2013 1419   CALCIUM 8.9 10/21/2020 1329   CALCIUM 8.7 11/30/2013 1419   AST 17 10/21/2020 1329   AST 28 11/30/2013 1419   ALT 27 10/21/2020 1329   ALT 71 (H) 11/30/2013 1419   ALKPHOS 117 10/21/2020 1329   ALKPHOS 114 11/30/2013 1419   BILITOT 0.7 10/21/2020 1329   BILITOT 0.7 11/30/2013 1419   PROT 6.4 (L) 10/21/2020 1329   PROT 7.1 11/30/2013 1419   ALBUMIN 3.7 10/21/2020 1329   ALBUMIN 3.3 (L) 11/30/2013 1419     Present during today's visit: patient only  Assessment and Plan: CBC reviewed, ANC 0.8 K/uL Continue taking venetoclax 400mg  for 14 days, then hold for 14 days. New cycle started 10/21/20    Oral Chemotherapy Side Effect/Intolerance:  Bowel movements: she reports both diarrhea and constipation. Her  constipation (mild) mostly comes in response to her needed to take loperamide for her diarrhea. She needs loperamide about every 2-3 days. She feels like she is able to effectively able to manage her bowel movements No reported nausea, edema, or severe fatigue  Oral Chemotherapy Adherence: no reported missed doses in her last cycle  New medications: No new medications.  Medication Access Issues: No access issues. Venetoclax filled at [facility]   Other: Pt is established with [facility] for pill packaging of her maintenance medications.   Patient expressed understanding and was in agreement with this plan. She also understands that She can call clinic at any  time with any questions, concerns, or complaints.   Thank you for allowing me to participate in the care of this very pleasant patient.   Time Total: 15 mins  Visit consisted of counseling and education on dealing with issues of symptom management in the setting of serious and potentially life-threatening illness.Greater than 50%  of this time was spent counseling and coordinating care related to the above assessment and plan.  Signed by: Darl Pikes, PharmD, BCPS, Salley Slaughter, CPP Hematology/Oncology Clinical Pharmacist Practitioner [facility] [phone]  10/22/2020 11:41 AM

## 2020-10-22 NOTE — Patient Instructions (Signed)
[facility] ONCOLOGY  Discharge Instructions: Thank you for choosing [facility] to provide your oncology and hematology care.  If you have a lab appointment with the [facility], please go directly to the [facility] and check in at the registration area.  Wear comfortable clothing and clothing appropriate for easy access to any Portacath or PICC line.   We strive to give you quality time with your provider. You may need to reschedule your appointment if you arrive late (15 or more minutes).  Arriving late affects you and other patients whose appointments are after yours.  Also, if you miss three or more appointments without notifying the office, you may be dismissed from the clinic at the provider's discretion.      For prescription refill requests, have your pharmacy contact our office and allow 72 hours for refills to be completed.    Today you received the following chemotherapy and/or immunotherapy agents - azacitadine      To help prevent nausea and vomiting after your treatment, we encourage you to take your nausea medication as directed.  BELOW ARE SYMPTOMS THAT SHOULD BE REPORTED IMMEDIATELY: *FEVER GREATER THAN 100.4 F (38 C) OR HIGHER *CHILLS OR SWEATING *NAUSEA AND VOMITING THAT IS NOT CONTROLLED WITH YOUR NAUSEA MEDICATION *UNUSUAL SHORTNESS OF BREATH *UNUSUAL BRUISING OR BLEEDING *URINARY PROBLEMS (pain or burning when urinating, or frequent urination) *BOWEL PROBLEMS (unusual diarrhea, constipation, pain near the anus) TENDERNESS IN MOUTH AND THROAT WITH OR WITHOUT PRESENCE OF ULCERS (sore throat, sores in mouth, or a toothache) UNUSUAL RASH, SWELLING OR PAIN  UNUSUAL VAGINAL DISCHARGE OR ITCHING   Items with * indicate a potential emergency and should be followed up as soon as possible or go to the Emergency Department if any problems should occur.  Please show the CHEMOTHERAPY ALERT CARD or IMMUNOTHERAPY ALERT CARD at  check-in to the Emergency Department and triage nurse.  Should you have questions after your visit or need to cancel or reschedule your appointment, please contact [facility]  [phone] and follow the prompts.  Office hours are 8:00 a.m. to 4:30 p.m. Monday - Friday. Please note that voicemails left after 4:00 p.m. may not be returned until the following business day.  We are closed weekends and major holidays. You have access to a nurse at all times for urgent questions. Please call the main number to the clinic [phone] and follow the prompts.  For any non-urgent questions, you may also contact your provider using MyChart. We now offer e-Visits for anyone 74 and older to request care online for non-urgent symptoms. For details visit [email or web address].   Also download the MyChart app! Go to the app store, search "MyChart", open the app, select [facility], and log in with your MyChart username and password.  Due to Covid, a mask is required upon entering the hospital/clinic. If you do not have a mask, one will be given to you upon arrival. For doctor visits, patients may have 1 support person aged 74 or older with them. For treatment visits, patients cannot have anyone with them due to current Covid guidelines and our immunocompromised population.   Azacitidine suspension for injection (subcutaneous use) What is this medication? AZACITIDINE (ay [place]) is a chemotherapy drug. This medicine reduces the growth of cancer cells and can suppress the immune system. It is used fortreating myelodysplastic syndrome or some types of leukemia. This medicine may be used for other purposes; ask  your health care provider orpharmacist if you have questions. COMMON BRAND NAME(S): Vidaza What should I tell my care team before I take this medication? They need to know if you have any of these conditions: kidney disease liver disease liver tumors an unusual  or allergic reaction to azacitidine, mannitol, other medicines, foods, dyes, or preservatives pregnant or trying to get pregnant breast-feeding How should I use this medication? This medicine is for injection under the skin. It is administered in a hospitalor clinic by a specially trained health care professional. Talk to your pediatrician regarding the use of this medicine in children. Whilethis drug may be prescribed for selected conditions, precautions do apply. Overdosage: If you think you have taken too much of this medicine contact apoison control center or emergency room at once. NOTE: This medicine is only for you. Do not share this medicine with others. What if I miss a dose? It is important not to miss your dose. Call your doctor or health careprofessional if you are unable to keep an appointment. What may interact with this medication? Interactions have not been studied. Give your health care provider a list of all the medicines, herbs, non-prescription drugs, or dietary supplements you use. Also tell them if you smoke, drink alcohol, or use illegal drugs. Some items may interact with yourmedicine. This list may not describe all possible interactions. Give your health care provider a list of all the medicines, herbs, non-prescription drugs, or dietary supplements you use. Also tell them if you smoke, drink alcohol, or use illegaldrugs. Some items may interact with your medicine. What should I watch for while using this medication? Visit your doctor for checks on your progress. This drug may make you feel generally unwell. This is not uncommon, as chemotherapy can affect healthy cells as well as cancer cells. Report any side effects. Continue your course oftreatment even though you feel ill unless your doctor tells you to stop. In some cases, you may be given additional medicines to help with side effects.Follow all directions for their use. Call your doctor or health care professional for  advice if you get a fever, chills or sore throat, or other symptoms of a cold or flu. Do not treat yourself. This drug decreases your body's ability to fight infections. Try toavoid being around people who are sick. This medicine may increase your risk to bruise or bleed. Call your doctor orhealth care professional if you notice any unusual bleeding. You may need blood work done while you are taking this medicine. Do not become pregnant while taking this medicine and for 6 months after the last dose. Women should inform their doctor if they wish to become pregnant or think they might be pregnant. Men should not father a child while taking this medicine and for 3 months after the last dose. There is a potential for serious side effects to an unborn child. Talk to your health care professional or pharmacist for more information. Do not breast-feed an infant while taking thismedicine and for 1 week after the last dose. This medicine may interfere with the ability to have a child. Talk with yourdoctor or health care professional if you are concerned about your fertility. What side effects may I notice from receiving this medication? Side effects that you should report to your doctor or health care professionalas soon as possible: allergic reactions like skin rash, itching or hives, swelling of the face, lips, or tongue low blood counts - this medicine may decrease the number of white  blood cells, red blood cells and platelets. You may be at increased risk for infections and bleeding. signs of infection - fever or chills, cough, sore throat, pain passing urine signs of decreased platelets or bleeding - bruising, pinpoint red spots on the skin, black, tarry stools, blood in the urine signs of decreased red blood cells - unusually weak or tired, fainting spells, lightheadedness signs and symptoms of kidney injury like trouble passing urine or change in the amount of urine signs and symptoms of liver injury  like dark yellow or brown urine; general ill feeling or flu-like symptoms; light-colored stools; loss of appetite; nausea; right upper belly pain; unusually weak or tired; yellowing of the eyes or skin Side effects that usually do not require medical attention (report to yourdoctor or health care professional if they continue or are bothersome): constipation diarrhea nausea, vomiting pain or redness at the injection site unusually weak or tired This list may not describe all possible side effects. Call your doctor for medical advice about side effects. You may report side effects to FDA at1-800-FDA-1088. Where should I keep my medication? This drug is given in a hospital or clinic and will not be stored at home. NOTE: This sheet is a summary. It may not cover all possible information. If you have questions about this medicine, talk to your doctor, pharmacist, orhealth care provider.  2022 Elsevier/Gold Standard (2016-05-19 14:37:51)

## 2020-10-22 NOTE — Progress Notes (Signed)
Nutrition Follow-up:   Patient with AML and receiving vidaza.   Met with patient during infusion.  Patient reports that her appetite is good.  Didn't remember that she had the shakes (dairy free) to try.  "I think I put them in my refrigerator and forgot about them."  Patient reports that she ate a pop tart this am, sometimes eats cereal or eggs and toast.  Last night ate [employer] cheeseburger.  One night recently ate piece of flounder, baked potato and slaw.  Reports little bit of nausea when she hasn't eaten very much.      Medications: reviewed  Labs: reviewed  Anthropometrics:   Weight 128 lb 8 oz today, stable  128 lb 3.2 oz on 5/24   NUTRITION DIAGNOSIS: Inadequate oral intake stable   INTERVENTION:  Encouraged patient to try oral nutrition supplement samples given last visit (dairy free alternatives) Encouraged patient to keep something on stomach to help with nausea.  Encouraged high calorie, high protein foods.       MONITORING, EVALUATION, GOAL: weight trends, intake   NEXT VISIT: Friday, July 22 during infusion   B. Zenia Resides, [place], [place] Registered Dietitian [phone] (mobile)

## 2020-10-22 NOTE — Progress Notes (Signed)
[place]  [phone] [phone]) [phone]  ID: Debra Hale OB: Apr 09, 1951  MR#: [id number]  TUU#:[id number]  Patient Care Team: [name], MD as PCP - General (Internal Medicine) Wellington Hampshire, MD as PCP - Cardiology (Cardiology)  CHIEF COMPLAINT: AML.  INTERVAL HISTORY: Patient returns to clinic today for further evaluation and continuation of cycle 5 of Vidaza.  She has chronic weakness and fatigue, but otherwise feels well.  Her appetite has improved and her weight has stabilized.  She is tolerating her treatments well without significant side effects. She has no neurologic complaints.  She denies any recent fevers or illnesses. She has no chest pain, shortness of breath, cough, or hemoptysis.  She denies any nausea, vomiting, constipation, or diarrhea.  She has no urinary complaints.  Patient offers no further specific complaints today.  REVIEW OF SYSTEMS:   Review of Systems  Constitutional:  Positive for malaise/fatigue. Negative for fever and weight loss.  Respiratory: Negative.  Negative for cough, hemoptysis and shortness of breath.   Cardiovascular: Negative.  Negative for chest pain and leg swelling.  Gastrointestinal:  Negative for abdominal pain and diarrhea.  Genitourinary: Negative.  Negative for dysuria.  Musculoskeletal: Negative.  Negative for back pain.  Skin: Negative.  Negative for rash.  Neurological:  Positive for weakness. Negative for dizziness, focal weakness and headaches.  Psychiatric/Behavioral:  Positive for memory loss. The patient is not nervous/anxious.    As per HPI. Otherwise, a complete review of systems is negative.  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   s/p surgery and XRT   Osteoporosis    Other and unspecified hyperlipidemia    Palpitations    Personal history of chemotherapy    Personal history of malignant neoplasm of large intestine    carcinoma - cecum, s/p right laparoscopic colectomy - s/p chemotherapy and XRT   Personal history of radiation therapy    Pneumonia 2019   PONV (postoperative nausea and vomiting)    Pure hypercholesterolemia    Unspecified hereditary and idiopathic peripheral neuropathy     PAST SURGICAL HISTORY: Past Surgical History:  Procedure Laterality Date   APPENDECTOMY  1985   BREAST BIOPSY Right 2009   positive   BREAST BIOPSY Right 2009   negative   BREAST LUMPECTOMY Right 2009   breast cancer   CHOLECYSTECTOMY  1995   COLONOSCOPY     COLONOSCOPY WITH PROPOFOL N/A 10/29/2017   Procedure: COLONOSCOPY WITH PROPOFOL;  Surgeon: Manya Silvas, MD;  Location: [facility] ENDOSCOPY;  Service: Endoscopy;  Laterality: N/A;   DILATION AND CURETTAGE OF UTERUS  1990   DILATION AND CURETTAGE, DIAGNOSTIC / THERAPEUTIC  1990   ESOPHAGOGASTRODUODENOSCOPY     ESOPHAGOGASTRODUODENOSCOPY (EGD) WITH PROPOFOL N/A 10/29/2017   Procedure: ESOPHAGOGASTRODUODENOSCOPY (EGD) WITH PROPOFOL;  Surgeon: Manya Silvas, MD;  Location: [facility] ENDOSCOPY;  Service: Endoscopy;  Laterality: N/A;   INCISION AND  DRAINAGE PERIRECTAL ABSCESS N/A 06/17/2020   Procedure: IRRIGATION AND DEBRIDEMENT PERIRECTAL ABSCESS;  Surgeon: Jules Husbands, MD;  Location: [facility] ORS;  Service: General;  Laterality: N/A;   LAPAROSCOPIC PARTIAL COLECTOMY     stage 3-C carcinoma of the cecum, s/p chemotherapy and xrt   LITHOTRIPSY     SIGMOIDOSCOPY  08/26/1993   THYROID LOBECTOMY  2002   s/p XRT   TOTAL HIP ARTHROPLASTY Right 10/24/2019    Procedure: TOTAL HIP ARTHROPLASTY;  Surgeon: Corky Mull, MD;  Location: [facility] ORS;  Service: Orthopedics;  Laterality: Right;    FAMILY HISTORY: Family History  Problem Relation Age of Onset   Stroke Mother        47s   Alzheimer's disease Mother    Cancer Mother    Lung cancer Father    Prostate cancer Father    Cancer Father        Colon   Colon cancer Father    Breast cancer Sister        37's   Lung cancer Sister    Breast cancer Maternal Aunt     ADVANCED DIRECTIVES (Y/N):  N  HEALTH MAINTENANCE: Social History   Tobacco Use   Smoking status: Never   Smokeless tobacco: Never  Vaping Use   Vaping Use: Never used  Substance Use Topics   Alcohol use: No    Alcohol/week: 0.0 standard drinks   Drug use: No     Colonoscopy:  PAP:  Bone density:  Lipid panel:  Allergies  Allergen Reactions   Demeclocycline Other (See Comments)    Throat swells   Sulfa Antibiotics     Other reaction(s): Other (See Comments) Throat swells   Tetracyclines & Related Other (See Comments)    Throat swells    Augmentin [Amoxicillin-Pot Clavulanate] Diarrhea   Bentyl [Dicyclomine Hcl]     unkn   Ciprofloxacin Diarrhea   Codeine Other (See Comments)    dizziness     Epinephrine     Speeds heart up - panic    Flagyl [Metronidazole] Nausea And Vomiting   Librax [Chlordiazepoxide-Clidinium]     unkn   Novocain [Procaine] Other (See Comments)    "Shaky"   Phenobarbital     unkn   Prednisone     Nervous     Ultram [Tramadol] Other (See Comments)    Sick feeling    Current Outpatient Medications  Medication Sig Dispense Refill   clonazePAM (KLONOPIN) 0.5 MG tablet Take 1.5 mg by mouth 2 (two) times daily.   0   diphenoxylate-atropine (LOMOTIL) 2.5-0.025 MG tablet Take 1 tablet by mouth 4 (four) times daily as needed for diarrhea or loose stools. 120 tablet 0   escitalopram (LEXAPRO) 20 MG tablet Take 20 mg by mouth every morning.      levothyroxine (SYNTHROID) 112 MCG  tablet Take 1 tablet (112 mcg total) by mouth daily before breakfast. 30 tablet 1   metoprolol tartrate (LOPRESSOR) 25 MG tablet TAKE 1 TABLET (25 MG TOTAL) BY MOUTH AS NEEDED (AS NEEDED UP TO TWICE DAILY FOR PALPITATIONS). 60 tablet 1   pantoprazole (PROTONIX) 40 MG tablet TAKE 1 TABLET BY MOUTH EVERY DAY 90 tablet 2   valACYclovir (VALTREX) 500 MG tablet Take 1 tablet (500 mg total) by mouth daily. 30 tablet 2   venetoclax (VENCLEXTA) 100 MG tablet Take 400 mg by mouth daily. Take for 14 days, then hold for 14 days. Repeat every 28 days. Take with a meal and water.  Current Facility-Administered Medications  Medication Dose Route Frequency Provider Last Rate Last Admin   ondansetron (ZOFRAN) tablet 8 mg  8 mg Oral Once Guse, Jacquelynn Cree, FNP       Facility-Administered Medications Ordered in Other Visits  Medication Dose Route Frequency Provider Last Rate Last Admin   potassium chloride (KLOR-CON) CR tablet 40 mEq  40 mEq Oral BID Lloyd Huger, MD   40 mEq at 07/08/20 1136    OBJECTIVE: Vitals:   10/22/20 1002  BP: 122/68  Pulse: 82  Resp: 18  Temp: 97.7 F (36.5 C)  SpO2: 100%     Body mass index is 21.72 kg/m.    ECOG FS:1 - Symptomatic but completely ambulatory  General: Well-developed, well-nourished, no acute distress. Eyes: Pink conjunctiva, anicteric sclera. HEENT: Normocephalic, moist mucous membranes. Lungs: No audible wheezing or coughing. Heart: Regular rate and rhythm. Abdomen: Soft, nontender, no obvious distention. Musculoskeletal: No edema, cyanosis, or clubbing. Neuro: Alert, answering all questions appropriately. Cranial nerves grossly intact. Skin: No rashes or petechiae noted. Psych: Normal affect.   LAB RESULTS:  Lab Results  Component Value Date   NA 140 10/21/2020   K 3.6 10/21/2020   CL 106 10/21/2020   CO2 24 10/21/2020   GLUCOSE 99 10/21/2020   BUN 24 (H) 10/21/2020   CREATININE 0.95 10/21/2020   CALCIUM 8.9 10/21/2020   PROT 6.4  (L) 10/21/2020   ALBUMIN 3.7 10/21/2020   AST 17 10/21/2020   ALT 27 10/21/2020   ALKPHOS 117 10/21/2020   BILITOT 0.7 10/21/2020   GFRNONAA >60 10/21/2020   GFRAA 54 (L) 10/26/2019    Lab Results  Component Value Date   WBC 1.5 (L) 10/21/2020   NEUTROABS 0.8 (L) 10/21/2020   HGB 13.0 10/21/2020   HCT 38.9 10/21/2020   MCV 82.1 10/21/2020   PLT 159 10/21/2020     STUDIES: No results found.   ASSESSMENT: AML.  PLAN:    1.  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  Her peripheral blood flow cytometry also revealed 6% aberrant myeloblasts.  Patient completed cycle 1 of induction therapy at [facility] with Vidaza and venetoclax.  She only received 5 days of Vidaza for cycle 2 recently which was interrupted secondary to significant diarrhea and a perirectal abscess.  Unclear of patient's compliance with venetoclax.  Repeat bone marrow biopsy on July 16, 2020 did not reveal any residual disease, but cytogenetics remained positive likely indicating a very good partial remission.  Given patient's difficulty with treatment and significant diarrhea venetoclax was discontinued altogether and will proceed with dose reduced Vidaza on days 1 through 5 on a 28-day cycle.  Proceed with cycle 5, day 2 of treatment today.  Return to clinic daily the remainder of the week to complete cycle 5.  Patient will return to clinic in 2 weeks for laboratory work and then in 4 weeks for further evaluation and consideration of cycle 6, day 1.  Repeat bone marrow biopsy at the conclusion of cycle 6. 2.  Neutropenia: Chronic and unchanged.  Discontinue venetoclax and dose reduce Vidaza as above. 3.  Thrombocytopenia: Resolved. 4.  Perirectal abscess: Resolved.   5.  Diarrhea: Essentially resolved since discontinuing venetoclax.  Continue Lomotil as prescribed. 6.  Supportive care: Patient has been given referrals to home health and home palliative care.  Appreciate palliative care input.   7. 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  Patient also received chemotherapy and likely received Adriamycin, but exact regimen is unknown.  She completed 5 years of hormonal therapy in approximately June 2015. Currently, she has no evidence of disease.  Her most recent mammogram on March 05, 2020 was reported as BI-RADS 1.  Repeat in November 2022. 8.  History of colon cancer: Patient also states that she received chemotherapy for this, possibly FOLFOX but again this is unknown.   I spent a total of 30 minutes reviewing chart data, face-to-face evaluation with the patient, counseling and coordination of care as detailed above.  Patient expressed understanding and was in agreement with this plan. She also understands that She can call clinic at any time with any questions, concerns, or complaints.   Cancer Staging History of breast cancer Staging form: Breast, AJCC 7th Edition - Clinical stage from 01/07/2016: Stage IA (T1c, N0, M0) - Signed by Lloyd Huger, MD on 01/07/2016 Laterality: Right Estrogen receptor status: Positive Progesterone receptor status: Positive HER2 status: Negative   Lloyd Huger, MD   10/22/2020 4:13 PM

## 2020-10-23 ENCOUNTER — Inpatient Hospital Stay: Payer: PPO

## 2020-10-23 VITALS — BP 122/65 | HR 89 | Temp 99.1°F | Resp 16

## 2020-10-23 DIAGNOSIS — C9200 Acute myeloblastic leukemia, not having achieved remission: Secondary | ICD-10-CM

## 2020-10-23 DIAGNOSIS — Z5111 Encounter for antineoplastic chemotherapy: Secondary | ICD-10-CM | POA: Diagnosis not present

## 2020-10-23 MED ORDER — ONDANSETRON HCL 4 MG PO TABS
8.0000 mg | ORAL_TABLET | Freq: Once | ORAL | Status: AC
  Administered 2020-10-23: 8 mg via ORAL
  Filled 2020-10-23: qty 2

## 2020-10-23 MED ORDER — AZACITIDINE CHEMO SQ INJECTION
50.0000 mg/m2 | Freq: Once | INTRAMUSCULAR | Status: AC
  Administered 2020-10-23: 87.5 mg via SUBCUTANEOUS
  Filled 2020-10-23: qty 3.5

## 2020-10-23 NOTE — Patient Instructions (Signed)
[facility]COLOG[facility]  Discharge Instructions: Thank you for choosing [facility] to provide your oncology and hematology care.  If you have a lab appointment with the [facility], please go directly to the [facility] and check in at the registration area.  Wear comfortable clothing and clothing appropriate for easy access to any Portacath or PICC line.   We strive to give you quality time with your provider. You may need to reschedule your appointment if you arrive late (15 or more minutes).  Arriving late affects you and other patients whose appointments are after yours.  Also, if you miss three or more appointments without notifying the office, you may be dismissed from the clinic at the provider's discretion.      For prescription refill requests, have your pharmacy contact our office and allow 72 hours for refills to be completed.    Today you received the following chemotherapy and/or immunotherapy agents Vidaza      To help prevent nausea and vomiting after your treatment, we encourage you to take your nausea medication as directed.  BELOW ARE SYMPTOMS THAT SHOULD BE REPORTED IMMEDIATELY: *FEVER GREATER THAN 100.4 F (38 C) OR HIGHER *CHILLS OR SWEATING *NAUSEA AND VOMITING THAT IS NOT CONTROLLED WITH YOUR NAUSEA MEDICATION *UNUSUAL SHORTNESS OF BREATH *UNUSUAL BRUISING OR BLEEDING *URINARY PROBLEMS (pain or burning when urinating, or frequent urination) *BOWEL PROBLEMS (unusual diarrhea, constipation, pain near the anus) TENDERNESS IN MOUTH AND THROAT WITH OR WITHOUT PRESENCE OF ULCERS (sore throat, sores in mouth, or a toothache) UNUSUAL RASH, SWELLING OR PAIN  UNUSUAL VAGINAL DISCHARGE OR ITCHING   Items with * indicate a potential emergency and should be followed up as soon as possible or go to the Emergency Department if any problems should occur.  Please show the CHEMOTHERAPY ALERT CARD or IMMUNOTHERAPY ALERT CARD at check-in to  the Emergency Department and triage nurse.  Should you have questions after your visit or need to cancel or reschedule your appointment, please contact [facility]  [phone] and follow the prompts.  Office hours are 8:00 a.m. to 4:30 p.m. Monday - Friday. Please note that voicemails left after 4:00 p.m. may not be returned until the following business day.  We are closed weekends and major holidays. You have access to a nurse at all times for urgent questions. Please call the main number to the clinic [phone] and follow the prompts.  For any non-urgent questions, you may also contact your provider using MyChart. We now offer e-Visits for anyone 3 and older to request care online for non-urgent symptoms. For details visit [email or web address].   Also download the MyChart app! Go to the app store, search "MyChart", open the app, select [facility], and log in with your MyChart username and password.  Due to Covid, a mask is required upon entering the hospital/clinic. If you do not have a mask, one will be given to you upon arrival. For doctor visits, patients may have 1 support person aged 64 or older with them. For treatment visits, patients cannot have anyone with them due to current Covid guidelines and our immunocompromised population.

## 2020-10-24 ENCOUNTER — Other Ambulatory Visit: Payer: Self-pay

## 2020-10-24 ENCOUNTER — Inpatient Hospital Stay: Payer: PPO

## 2020-10-24 VITALS — BP 115/70 | HR 90 | Temp 99.2°F | Resp 20

## 2020-10-24 DIAGNOSIS — Z5111 Encounter for antineoplastic chemotherapy: Secondary | ICD-10-CM | POA: Diagnosis not present

## 2020-10-24 DIAGNOSIS — C9200 Acute myeloblastic leukemia, not having achieved remission: Secondary | ICD-10-CM

## 2020-10-24 MED ORDER — AZACITIDINE CHEMO SQ INJECTION
50.0000 mg/m2 | Freq: Once | INTRAMUSCULAR | Status: AC
Start: 2020-10-24 — End: 2020-10-24
  Administered 2020-10-24: 87.5 mg via SUBCUTANEOUS
  Filled 2020-10-24: qty 3.5

## 2020-10-24 MED ORDER — ONDANSETRON HCL 4 MG PO TABS
8.0000 mg | ORAL_TABLET | Freq: Once | ORAL | Status: AC
Start: 2020-10-24 — End: 2020-10-24
  Administered 2020-10-24: 8 mg via ORAL
  Filled 2020-10-24: qty 2

## 2020-10-24 NOTE — Patient Instructions (Addendum)
Azacitidine suspension for injection (subcutaneous use) What is this medication? AZACITIDINE (ay za SITE i deen) is a chemotherapy drug. This medicine reduces the growth of cancer cells and can suppress the immune system. It is used fortreating myelodysplastic syndrome or some types of leukemia. This medicine may be used for other purposes; ask your health care provider orpharmacist if you have questions. COMMON BRAND NAME(S): Vidaza What should I tell my care team before I take this medication? They need to know if you have any of these conditions: kidney disease liver disease liver tumors an unusual or allergic reaction to azacitidine, mannitol, other medicines, foods, dyes, or preservatives pregnant or trying to get pregnant breast-feeding How should I use this medication? This medicine is for injection under the skin. It is administered in a hospitalor clinic by a specially trained health care professional. Talk to your pediatrician regarding the use of this medicine in children. Whilethis drug may be prescribed for selected conditions, precautions do apply. Overdosage: If you think you have taken too much of this medicine contact apoison control center or emergency room at once. NOTE: This medicine is only for you. Do not share this medicine with others. What if I miss a dose? It is important not to miss your dose. Call your doctor or health careprofessional if you are unable to keep an appointment. What may interact with this medication? Interactions have not been studied. Give your health care provider a list of all the medicines, herbs, non-prescription drugs, or dietary supplements you use. Also tell them if you smoke, drink alcohol, or use illegal drugs. Some items may interact with yourmedicine. This list may not describe all possible interactions. Give your health care provider a list of all the medicines, herbs, non-prescription drugs, or dietary supplements you use. Also tell  them if you smoke, drink alcohol, or use illegaldrugs. Some items may interact with your medicine. What should I watch for while using this medication? Visit your doctor for checks on your progress. This drug may make you feel generally unwell. This is not uncommon, as chemotherapy can affect healthy cells as well as cancer cells. Report any side effects. Continue your course oftreatment even though you feel ill unless your doctor tells you to stop. In some cases, you may be given additional medicines to help with side effects.Follow all directions for their use. Call your doctor or health care professional for advice if you get a fever, chills or sore throat, or other symptoms of a cold or flu. Do not treat yourself. This drug decreases your body's ability to fight infections. Try toavoid being around people who are sick. This medicine may increase your risk to bruise or bleed. Call your doctor orhealth care professional if you notice any unusual bleeding. You may need blood work done while you are taking this medicine. Do not become pregnant while taking this medicine and for 6 months after the last dose. Women should inform their doctor if they wish to become pregnant or think they might be pregnant. Men should not father a child while taking this medicine and for 3 months after the last dose. There is a potential for serious side effects to an unborn child. Talk to your health care professional or pharmacist for more information. Do not breast-feed an infant while taking thismedicine and for 1 week after the last dose. This medicine may interfere with the ability to have a child. Talk with yourdoctor or health care professional if you are concerned about your fertility.   What side effects may I notice from receiving this medication? Side effects that you should report to your doctor or health care professionalas soon as possible: allergic reactions like skin rash, itching or hives, swelling of the  face, lips, or tongue low blood counts - this medicine may decrease the number of white blood cells, red blood cells and platelets. You may be at increased risk for infections and bleeding. signs of infection - fever or chills, cough, sore throat, pain passing urine signs of decreased platelets or bleeding - bruising, pinpoint red spots on the skin, black, tarry stools, blood in the urine signs of decreased red blood cells - unusually weak or tired, fainting spells, lightheadedness signs and symptoms of kidney injury like trouble passing urine or change in the amount of urine signs and symptoms of liver injury like dark yellow or brown urine; general ill feeling or flu-like symptoms; light-colored stools; loss of appetite; nausea; right upper belly pain; unusually weak or tired; yellowing of the eyes or skin Side effects that usually do not require medical attention (report to yourdoctor or health care professional if they continue or are bothersome): constipation diarrhea nausea, vomiting pain or redness at the injection site unusually weak or tired This list may not describe all possible side effects. Call your doctor for medical advice about side effects. You may report side effects to FDA at1-800-FDA-1088. Where should I keep my medication? This drug is given in a hospital or clinic and will not be stored at home. NOTE: This sheet is a summary. It may not cover all possible information. If you have questions about this medicine, talk to your doctor, pharmacist, orhealth care provider.  2022 Elsevier/Gold Standard (2016-05-19 14:37:51) [facility] [place] ONCOLOG[facility]  Discharge Instructions: Thank you for choosing [facility] to provide your oncology and hematology care.  If you have a lab appointment with the [facility], please go directly to the [facility] and check in at the registration area.  Wear comfortable clothing and clothing appropriate for  easy access to any Portacath or PICC line.   We strive to give you quality time with your provider. You may need to reschedule your appointment if you arrive late (15 or more minutes).  Arriving late affects you and other patients whose appointments are after yours.  Also, if you miss three or more appointments without notifying the office, you may be dismissed from the clinic at the provider's discretion.      For prescription refill requests, have your pharmacy contact our office and allow 72 hours for refills to be completed.    Today you received the following chemotherapy and/or immunotherapy agents Vidaza      To help prevent nausea and vomiting after your treatment, we encourage you to take your nausea medication as directed.  BELOW ARE SYMPTOMS THAT SHOULD BE REPORTED IMMEDIATELY: *FEVER GREATER THAN 100.4 F (38 C) OR HIGHER *CHILLS OR SWEATING *NAUSEA AND VOMITING THAT IS NOT CONTROLLED WITH YOUR NAUSEA MEDICATION *UNUSUAL SHORTNESS OF BREATH *UNUSUAL BRUISING OR BLEEDING *URINARY PROBLEMS (pain or burning when urinating, or frequent urination) *BOWEL PROBLEMS (unusual diarrhea, constipation, pain near the anus) TENDERNESS IN MOUTH AND THROAT WITH OR WITHOUT PRESENCE OF ULCERS (sore throat, sores in mouth, or a toothache) UNUSUAL RASH, SWELLING OR PAIN  UNUSUAL VAGINAL DISCHARGE OR ITCHING   Items with * indicate a potential emergency and should be followed up as soon as possible or go to the Emergency Department if any problems  should occur.  Please show the CHEMOTHERAPY ALERT CARD or IMMUNOTHERAPY ALERT CARD at check-in to the Emergency Department and triage nurse.  Should you have questions after your visit or need to cancel or reschedule your appointment, please contact [facility]  [phone] and follow the prompts.  Office hours are 8:00 a.m. to 4:30 p.m. Monday - Friday. Please note that voicemails left after 4:00 p.m. may not be  returned until the following business day.  We are closed weekends and major holidays. You have access to a nurse at all times for urgent questions. Please call the main number to the clinic [phone] and follow the prompts.  For any non-urgent questions, you may also contact your provider using MyChart. We now offer e-Visits for anyone 77 and older to request care online for non-urgent symptoms. For details visit [email or web address].   Also download the MyChart app! Go to the app store, search "MyChart", open the app, select [facility], and log in with your MyChart username and password.  Due to Covid, a mask is required upon entering the hospital/clinic. If you do not have a mask, one will be given to you upon arrival. For doctor visits, patients may have 1 support person aged 74 or older with them. For treatment visits, patients cannot have anyone with them due to current Covid guidelines and our immunocompromised population.

## 2020-10-25 ENCOUNTER — Inpatient Hospital Stay: Payer: PPO

## 2020-10-25 VITALS — BP 124/67 | HR 86 | Temp 98.0°F | Resp 18

## 2020-10-25 DIAGNOSIS — C9200 Acute myeloblastic leukemia, not having achieved remission: Secondary | ICD-10-CM

## 2020-10-25 DIAGNOSIS — Z5111 Encounter for antineoplastic chemotherapy: Secondary | ICD-10-CM | POA: Diagnosis not present

## 2020-10-25 MED ORDER — ONDANSETRON HCL 4 MG PO TABS
8.0000 mg | ORAL_TABLET | Freq: Once | ORAL | Status: AC
  Administered 2020-10-25: 8 mg via ORAL
  Filled 2020-10-25: qty 2

## 2020-10-25 MED ORDER — AZACITIDINE CHEMO SQ INJECTION
50.0000 mg/m2 | Freq: Once | INTRAMUSCULAR | Status: AC
  Administered 2020-10-25: 87.5 mg via SUBCUTANEOUS
  Filled 2020-10-25: qty 3.5

## 2020-10-25 NOTE — Progress Notes (Signed)
Pt here for Vidaza. Pt states she was extremely tired and laid down for a nap and woke up extremely "woozy headed"  she reports this has been happening for several days. Faythe Casa NP and Dr. [name] aware. Per Faythe Casa NP, Proceed with Vidaza and pt to increase water intake. Pt educated on plan and to call clinic with any concerns. Pt agrees with plan and verbalizes understanding.

## 2020-10-25 NOTE — Patient Instructions (Signed)
[facility]COLOG[facility]  Discharge Instructions: Thank you for choosing [facility] to provide your oncology and hematology care.  If you have a lab appointment with the [facility], please go directly to the [facility] and check in at the registration area.  Wear comfortable clothing and clothing appropriate for easy access to any Portacath or PICC line.   We strive to give you quality time with your provider. You may need to reschedule your appointment if you arrive late (15 or more minutes).  Arriving late affects you and other patients whose appointments are after yours.  Also, if you miss three or more appointments without notifying the office, you may be dismissed from the clinic at the provider's discretion.      For prescription refill requests, have your pharmacy contact our office and allow 72 hours for refills to be completed.    Today you received the following chemotherapy and/or immunotherapy agents Vidaza      To help prevent nausea and vomiting after your treatment, we encourage you to take your nausea medication as directed.  BELOW ARE SYMPTOMS THAT SHOULD BE REPORTED IMMEDIATELY: *FEVER GREATER THAN 100.4 F (38 C) OR HIGHER *CHILLS OR SWEATING *NAUSEA AND VOMITING THAT IS NOT CONTROLLED WITH YOUR NAUSEA MEDICATION *UNUSUAL SHORTNESS OF BREATH *UNUSUAL BRUISING OR BLEEDING *URINARY PROBLEMS (pain or burning when urinating, or frequent urination) *BOWEL PROBLEMS (unusual diarrhea, constipation, pain near the anus) TENDERNESS IN MOUTH AND THROAT WITH OR WITHOUT PRESENCE OF ULCERS (sore throat, sores in mouth, or a toothache) UNUSUAL RASH, SWELLING OR PAIN  UNUSUAL VAGINAL DISCHARGE OR ITCHING   Items with * indicate a potential emergency and should be followed up as soon as possible or go to the Emergency Department if any problems should occur.  Please show the CHEMOTHERAPY ALERT CARD or IMMUNOTHERAPY ALERT CARD at check-in to  the Emergency Department and triage nurse.  Should you have questions after your visit or need to cancel or reschedule your appointment, please contact [facility]  [phone] and follow the prompts.  Office hours are 8:00 a.m. to 4:30 p.m. Monday - Friday. Please note that voicemails left after 4:00 p.m. may not be returned until the following business day.  We are closed weekends and major holidays. You have access to a nurse at all times for urgent questions. Please call the main number to the clinic [phone] and follow the prompts.  For any non-urgent questions, you may also contact your provider using MyChart. We now offer e-Visits for anyone 3 and older to request care online for non-urgent symptoms. For details visit [email or web address].   Also download the MyChart app! Go to the app store, search "MyChart", open the app, select [facility], and log in with your MyChart username and password.  Due to Covid, a mask is required upon entering the hospital/clinic. If you do not have a mask, one will be given to you upon arrival. For doctor visits, patients may have 1 support person aged 64 or older with them. For treatment visits, patients cannot have anyone with them due to current Covid guidelines and our immunocompromised population.

## 2020-11-05 ENCOUNTER — Other Ambulatory Visit: Payer: Self-pay

## 2020-11-05 ENCOUNTER — Inpatient Hospital Stay: Payer: PPO | Attending: Oncology

## 2020-11-05 DIAGNOSIS — R197 Diarrhea, unspecified: Secondary | ICD-10-CM | POA: Insufficient documentation

## 2020-11-05 DIAGNOSIS — Z85850 Personal history of malignant neoplasm of thyroid: Secondary | ICD-10-CM | POA: Diagnosis not present

## 2020-11-05 DIAGNOSIS — Z79899 Other long term (current) drug therapy: Secondary | ICD-10-CM | POA: Insufficient documentation

## 2020-11-05 DIAGNOSIS — Z853 Personal history of malignant neoplasm of breast: Secondary | ICD-10-CM | POA: Insufficient documentation

## 2020-11-05 DIAGNOSIS — E7800 Pure hypercholesterolemia, unspecified: Secondary | ICD-10-CM | POA: Insufficient documentation

## 2020-11-05 DIAGNOSIS — C9200 Acute myeloblastic leukemia, not having achieved remission: Secondary | ICD-10-CM

## 2020-11-05 DIAGNOSIS — Z5111 Encounter for antineoplastic chemotherapy: Secondary | ICD-10-CM | POA: Diagnosis not present

## 2020-11-05 DIAGNOSIS — Z85038 Personal history of other malignant neoplasm of large intestine: Secondary | ICD-10-CM | POA: Diagnosis not present

## 2020-11-05 LAB — COMPREHENSIVE METABOLIC PANEL
ALT: 18 U/L (ref 0–44)
AST: 14 U/L — ABNORMAL LOW (ref 15–41)
Albumin: 3.4 g/dL — ABNORMAL LOW (ref 3.5–5.0)
Alkaline Phosphatase: 118 U/L (ref 38–126)
Anion gap: 9 (ref 5–15)
BUN: 13 mg/dL (ref 8–23)
CO2: 27 mmol/L (ref 22–32)
Calcium: 8.4 mg/dL — ABNORMAL LOW (ref 8.9–10.3)
Chloride: 105 mmol/L (ref 98–111)
Creatinine, Ser: 0.97 mg/dL (ref 0.44–1.00)
GFR, Estimated: >60 mL/min (ref >60)
Glucose, Bld: 115 mg/dL — ABNORMAL HIGH (ref 70–99)
Potassium: 3.1 mmol/L — ABNORMAL LOW (ref 3.5–5.1)
Sodium: 141 mmol/L (ref 135–145)
Total Bilirubin: 0.8 mg/dL (ref 0.3–1.2)
Total Protein: 6.5 g/dL (ref 6.5–8.1)

## 2020-11-05 LAB — CBC WITH DIFFERENTIAL/PLATELET
Abs Immature Granulocytes: 0 10*3/uL (ref 0.00–0.07)
Basophils Absolute: 0 10*3/uL (ref 0.0–0.1)
Basophils Relative: 0 %
Eosinophils Absolute: 0 10*3/uL (ref 0.0–0.5)
Eosinophils Relative: 0 %
HCT: 36.7 % (ref 36.0–46.0)
Hemoglobin: 12.3 g/dL (ref 12.0–15.0)
Immature Granulocytes: 0 %
Lymphocytes Relative: 36 %
Lymphs Abs: 0.5 10*3/uL — ABNORMAL LOW (ref 0.7–4.0)
MCH: 27.8 pg (ref 26.0–34.0)
MCHC: 33.5 g/dL (ref 30.0–36.0)
MCV: 82.8 fL (ref 80.0–100.0)
Monocytes Absolute: 0.2 10*3/uL (ref 0.1–1.0)
Monocytes Relative: 10 %
Neutro Abs: 0.8 10*3/uL — ABNORMAL LOW (ref 1.7–7.7)
Neutrophils Relative %: 54 %
Platelets: 98 10*3/uL — ABNORMAL LOW (ref 150–400)
RBC: 4.43 MIL/uL (ref 3.87–5.11)
RDW: 14.9 % (ref 11.5–15.5)
Smear Review: DECREASED
WBC: 1.5 10*3/uL — ABNORMAL LOW (ref 4.0–10.5)
nRBC: 0 % (ref 0.0–0.2)

## 2020-11-05 LAB — SAMPLE TO BLOOD BANK

## 2020-11-05 LAB — MAGNESIUM: Magnesium: 1.3 mg/dL — ABNORMAL LOW (ref 1.7–2.4)

## 2020-11-07 ENCOUNTER — Other Ambulatory Visit: Payer: Self-pay | Admitting: Internal Medicine

## 2020-11-08 ENCOUNTER — Encounter: Payer: PPO | Admitting: Internal Medicine

## 2020-11-11 MED FILL — VENCLEXTA 100 MG TABLET: 28 days supply | Qty: 56 | Fill #2

## 2020-11-15 ENCOUNTER — Emergency Department: Payer: PPO

## 2020-11-15 ENCOUNTER — Other Ambulatory Visit: Payer: Self-pay

## 2020-11-15 ENCOUNTER — Encounter: Payer: Self-pay | Admitting: Emergency Medicine

## 2020-11-15 DIAGNOSIS — E039 Hypothyroidism, unspecified: Secondary | ICD-10-CM | POA: Diagnosis not present

## 2020-11-15 DIAGNOSIS — Z96641 Presence of right artificial hip joint: Secondary | ICD-10-CM | POA: Insufficient documentation

## 2020-11-15 DIAGNOSIS — Z79899 Other long term (current) drug therapy: Secondary | ICD-10-CM | POA: Insufficient documentation

## 2020-11-15 DIAGNOSIS — Z853 Personal history of malignant neoplasm of breast: Secondary | ICD-10-CM | POA: Diagnosis not present

## 2020-11-15 DIAGNOSIS — Z85850 Personal history of malignant neoplasm of thyroid: Secondary | ICD-10-CM | POA: Diagnosis not present

## 2020-11-15 DIAGNOSIS — R079 Chest pain, unspecified: Secondary | ICD-10-CM | POA: Diagnosis not present

## 2020-11-15 DIAGNOSIS — E876 Hypokalemia: Secondary | ICD-10-CM | POA: Insufficient documentation

## 2020-11-15 DIAGNOSIS — I493 Ventricular premature depolarization: Secondary | ICD-10-CM | POA: Diagnosis not present

## 2020-11-15 DIAGNOSIS — Z85038 Personal history of other malignant neoplasm of large intestine: Secondary | ICD-10-CM | POA: Diagnosis not present

## 2020-11-15 DIAGNOSIS — R072 Precordial pain: Secondary | ICD-10-CM | POA: Diagnosis not present

## 2020-11-15 DIAGNOSIS — R0789 Other chest pain: Secondary | ICD-10-CM | POA: Diagnosis not present

## 2020-11-15 DIAGNOSIS — R0981 Nasal congestion: Secondary | ICD-10-CM | POA: Insufficient documentation

## 2020-11-15 DIAGNOSIS — R002 Palpitations: Secondary | ICD-10-CM | POA: Diagnosis not present

## 2020-11-15 LAB — CBC
HCT: 39.3 % (ref 36.0–46.0)
Hemoglobin: 13.4 g/dL (ref 12.0–15.0)
MCH: 28.5 pg (ref 26.0–34.0)
MCHC: 34.1 g/dL (ref 30.0–36.0)
MCV: 83.6 fL (ref 80.0–100.0)
Platelets: 260 10*3/uL (ref 150–400)
RBC: 4.7 MIL/uL (ref 3.87–5.11)
RDW: 14.9 % (ref 11.5–15.5)
WBC: 1.3 10*3/uL — CL (ref 4.0–10.5)
nRBC: 0 % (ref 0.0–0.2)

## 2020-11-15 LAB — TROPONIN I (HIGH SENSITIVITY)
Troponin I (High Sensitivity): <2 ng/L (ref <18)
Troponin I (High Sensitivity): <2 ng/L (ref <18)

## 2020-11-15 LAB — BASIC METABOLIC PANEL
Anion gap: 8 (ref 5–15)
BUN: 17 mg/dL (ref 8–23)
CO2: 26 mmol/L (ref 22–32)
Calcium: 8.8 mg/dL — ABNORMAL LOW (ref 8.9–10.3)
Chloride: 107 mmol/L (ref 98–111)
Creatinine, Ser: 0.95 mg/dL (ref 0.44–1.00)
GFR, Estimated: >60 mL/min (ref >60)
Glucose, Bld: 106 mg/dL — ABNORMAL HIGH (ref 70–99)
Potassium: 3 mmol/L — ABNORMAL LOW (ref 3.5–5.1)
Sodium: 141 mmol/L (ref 135–145)

## 2020-11-15 NOTE — ED Triage Notes (Signed)
Pt reports that she has been having chest pressure and palpitations for the last two days. Pt also has lukemia. Denies any N/V, SOB or diaphoresis.

## 2020-11-16 ENCOUNTER — Emergency Department
Admission: EM | Admit: 2020-11-16 | Discharge: 2020-11-16 | Disposition: A | Discharge status: Home / Self Care | Payer: PPO | Attending: Emergency Medicine | Admitting: Emergency Medicine

## 2020-11-16 DIAGNOSIS — R002 Palpitations: Secondary | ICD-10-CM

## 2020-11-16 DIAGNOSIS — R0981 Nasal congestion: Secondary | ICD-10-CM

## 2020-11-16 DIAGNOSIS — I493 Ventricular premature depolarization: Secondary | ICD-10-CM

## 2020-11-16 DIAGNOSIS — E876 Hypokalemia: Secondary | ICD-10-CM

## 2020-11-16 DIAGNOSIS — R0789 Other chest pain: Secondary | ICD-10-CM

## 2020-11-16 MED ORDER — POTASSIUM CHLORIDE CRYS ER 20 MEQ PO TBCR
20.0000 meq | EXTENDED_RELEASE_TABLET | Freq: Once | ORAL | Status: AC
  Administered 2020-11-16: 20 meq via ORAL
  Filled 2020-11-16: qty 1

## 2020-11-16 MED ORDER — POTASSIUM CHLORIDE CRYS ER 10 MEQ PO TBCR: 10.0000 meq | EXTENDED_RELEASE_TABLET | Freq: Two times a day (BID) | ORAL | 0 refills | Status: DC

## 2020-11-16 NOTE — ED Provider Notes (Signed)
[facility] Emergency Department Provider Note   ____________________________________________   Event Date/Time   First MD Initiated Contact with Patient 11/16/20 0119     (approximate)  I have reviewed the triage vital signs and the nursing notes.   HISTORY  Chief Complaint Chest Pain    HPI Debra Hale is a 70 y.o. 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 and in between rounds at this time          Past Medical History:  Diagnosis Date   Anxiety    Arthritis    Osteoarthritis   Breast cancer ([name]) 2009   right breast lumpectomy with rad tx   Colon cancer ([place])    surgery with chemo and rad tx   Complication of anesthesia    GERD (gastroesophageal reflux disease)    Heart palpitations    History of hiatal hernia    History of kidney stones    Hypothyroidism    Liver disease    Liver nodule    s/p negative biopsy   Malignant neoplasm of thyroid gland ([place]) 2002   s/p surgery and XRT   Osteoporosis    Other and unspecified hyperlipidemia    Palpitations    Personal history of chemotherapy    Personal history of malignant neoplasm of large intestine    carcinoma - cecum, s/p right laparoscopic colectomy - s/p chemotherapy and XRT   Personal history of radiation therapy    Pneumonia 2019   PONV (postoperative nausea and vomiting)    Pure hypercholesterolemia    Unspecified hereditary and idiopathic peripheral neuropathy     Patient Active Problem  List   Diagnosis Date Noted   Perirectal abscess 06/16/2020   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   Anemia 02/11/2020   Status post total hip replacement, 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   Rectal bleed 06/17/2019   Pleural effusion 12/03/2018   Right hip pain 12/03/2018   Leukopenia 08/21/2018   Change in vision 06/24/2018   Hypothyroidism 05/02/2018   Neck pain 05/02/2018   Nonintractable headache 04/18/2018   Thrush 04/18/2018   Pleural effusion associated with pulmonary infection 04/18/2018   Other fatigue 04/18/2018   Abdominal pain 03/06/2016   Acute diarrhea 01/10/2016   Neck nodule 10/13/2015   Vitamin D deficiency 07/27/2015   Loose stools 04/14/2015   Mild depression ([place]) 02/02/2015   Health care maintenance 06/17/2014   Stress 01/28/2014   Acute pericarditis 05/31/2013   Environmental allergies 02/25/2013   Left elbow pain 02/25/2013   Abnormal liver function test 12/07/2012   Anxiety 05/06/2012   GERD (gastroesophageal reflux disease) 05/06/2012   Osteoporosis 05/06/2012   History of breast cancer 09/18/2008   History of thyroid cancer 09/18/2008   Hypercholesterolemia 09/18/2008   Peripheral neuropathy 09/18/2008   Palpitations 09/18/2008   History of malignant neoplasm of large intestine 09/18/2008    Past Surgical History:  Procedure Laterality Date   APPENDECTOMY  1985   BREAST BIOPSY Right 2009   positive   BREAST BIOPSY Right 2009   negative   BREAST LUMPECTOMY  Right 2009   breast cancer   CHOLECYSTECTOMY  1995   COLONOSCOPY     COLONOSCOPY WITH PROPOFOL N/A 10/29/2017   Procedure: COLONOSCOPY WITH PROPOFOL;  Surgeon: Manya Silvas, MD;  Location: [facility] ENDOSCOPY;  Service: Endoscopy;  Laterality: N/A;   DILATION AND CURETTAGE OF UTERUS  1990   DILATION AND CURETTAGE, DIAGNOSTIC / THERAPEUTIC  1990   ESOPHAGOGASTRODUODENOSCOPY     ESOPHAGOGASTRODUODENOSCOPY (EGD) WITH PROPOFOL N/A 10/29/2017   Procedure: ESOPHAGOGASTRODUODENOSCOPY (EGD) WITH  PROPOFOL;  Surgeon: Manya Silvas, MD;  Location: [facility] ENDOSCOPY;  Service: Endoscopy;  Laterality: N/A;   INCISION AND DRAINAGE PERIRECTAL ABSCESS N/A 06/17/2020   Procedure: IRRIGATION AND DEBRIDEMENT PERIRECTAL ABSCESS;  Surgeon: Jules Husbands, MD;  Location: [facility] ORS;  Service: General;  Laterality: N/A;   LAPAROSCOPIC PARTIAL COLECTOMY     stage 3-C carcinoma of the cecum, s/p chemotherapy and xrt   LITHOTRIPSY     SIGMOIDOSCOPY  08/26/1993   THYROID LOBECTOMY  2002   s/p XRT   TOTAL HIP ARTHROPLASTY Right 10/24/2019   Procedure: TOTAL HIP ARTHROPLASTY;  Surgeon: Corky Mull, MD;  Location: [facility] ORS;  Service: Orthopedics;  Laterality: Right;    Prior to Admission medications   Medication Sig Start Date End Date Taking? Authorizing Provider  potassium chloride (KLOR-CON) 10 MEQ tablet Take 1 tablet (10 mEq total) by mouth 2 (two) times daily for 14 days. 11/16/20 11/30/20 Yes , Vista Lawman, MD  clonazePAM (KLONOPIN) 0.5 MG tablet Take 1.5 mg by mouth 2 (two) times daily.  09/06/17   [provider]  diphenoxylate-atropine (LOMOTIL) 2.5-0.025 MG tablet Take 1 tablet by mouth 4 (four) times daily as needed for diarrhea or loose stools. 06/24/20   Verlon Au, NP  escitalopram (LEXAPRO) 20 MG tablet Take 20 mg by mouth every morning.     [provider]  levothyroxine (SYNTHROID) 112 MCG tablet Take 1 tablet (112 mcg total) by mouth daily before breakfast. 10/07/20   [name], MD  metoprolol tartrate (LOPRESSOR) 25 MG tablet TAKE 1 TABLET (25 MG TOTAL) BY MOUTH AS NEEDED (AS NEEDED UP TO TWICE DAILY FOR PALPITATIONS). 09/04/20   Loel Dubonnet, NP  pantoprazole (PROTONIX) 40 MG tablet TAKE 1 TABLET BY MOUTH EVERY DAY 05/23/20   [name], MD  valACYclovir (VALTREX) 500 MG tablet Take 1 tablet (500 mg total) by mouth daily. 10/03/20   Borders, Kirt Boys, NP  venetoclax (VENCLEXTA) 100 MG tablet Take 400 mg by mouth daily. Take for 14 days, then hold for 14  days. Repeat every 28 days. Take with a meal and water.    [provider]    Allergies Demeclocycline, Sulfa antibiotics, Tetracyclines & related, Augmentin [amoxicillin-pot clavulanate], Bentyl [dicyclomine hcl], Ciprofloxacin, Codeine, Epinephrine, Flagyl [metronidazole], Librax [chlordiazepoxide-clidinium], Novocain [procaine], Phenobarbital, Prednisone, and Ultram [tramadol]  Family History  Problem Relation Age of Onset   Stroke Mother        67s   Alzheimer's disease Mother    Cancer Mother    Lung cancer Father    Prostate cancer Father    Cancer Father        Colon   Colon cancer Father    Breast cancer Sister        3's   Lung cancer Sister    Breast cancer Maternal Aunt     Social History Social History   Tobacco Use   Smoking status: Never   Smokeless tobacco: Never  Vaping Use   Vaping Use:  Never used  Substance Use Topics   Alcohol use: No    Alcohol/week: 0.0 standard drinks   Drug use: No    Review of Systems Constitutional: No fever/chills Eyes: No visual changes. ENT: No sore throat.  Endorses sinus congestion Cardiovascular: Endorses chest pain and palpitations Respiratory: Denies shortness of breath. Gastrointestinal: No abdominal pain.  No nausea, no vomiting.  No diarrhea. Genitourinary: Negative for dysuria. Musculoskeletal: Negative for acute arthralgias Skin: Negative for rash. Neurological: Negative for headaches, weakness/numbness/paresthesias in any extremity Psychiatric: Negative for suicidal ideation/homicidal ideation   ____________________________________________   PHYSICAL EXAM:  VITAL SIGNS: ED Triage Vitals  Enc Vitals Group     BP 11/15/20 1606 132/70     Pulse Rate 11/15/20 1606 73     Resp 11/15/20 1606 20     Temp 11/15/20 1606 98.5 F (36.9 C)     Temp Source 11/15/20 1606 Oral     SpO2 11/15/20 2302 99 %     Weight 11/15/20 1603 128 lb (58.1 kg)     Height 11/15/20 1603 5' 4.5" (1.638 m)     Head  Circumference --      Peak Flow --      Pain Score 11/15/20 1603 3     Pain Loc --      Pain Edu? --      Excl. in [place]? --    Constitutional: Alert and oriented. Well appearing and in no acute distress. Eyes: Conjunctivae are normal. PERRL. Head: Atraumatic. Nose: No congestion/rhinnorhea. Mouth/Throat: Mucous membranes are moist. Neck: No stridor Cardiovascular: Grossly normal heart sounds.  Good peripheral circulation. Respiratory: Normal respiratory effort.  No retractions. Gastrointestinal: Soft and nontender. No distention. Musculoskeletal: No obvious deformities Neurologic:  Normal speech and language. No gross focal neurologic deficits are appreciated. Skin:  Skin is warm and dry. No rash noted. Psychiatric: Mood and affect are normal. Speech and behavior are normal.  ____________________________________________   LABS (all labs ordered are listed, but only abnormal results are displayed)  Labs Reviewed  BASIC METABOLIC PANEL - Abnormal; Notable for the following components:      Result Value   Potassium 3.0 (*)    Glucose, Bld 106 (*)    Calcium 8.8 (*)    All other components within normal limits  CBC - Abnormal; Notable for the following components:   WBC 1.3 (*)    All other components within normal limits  TROPONIN I (HIGH SENSITIVITY)  TROPONIN I (HIGH SENSITIVITY)   ____________________________________________  EKG  ED ECG REPORT I, Naaman Plummer, the attending physician, personally viewed and interpreted this ECG.  Date: 11/16/2020 EKG Time: 1559 Rate: 77 Rhythm: normal sinus rhythm QRS Axis: normal Intervals: normal ST/T Wave abnormalities: normal Narrative Interpretation: Multiple PVCs.  No evidence of acute ischemia  ____________________________________________  RADIOLOGY  ED MD interpretation: 2 view chest x-ray shows no evidence of acute abnormalities including no pneumonia, pneumothorax, or widened mediastinum  Official radiology  report(s): DG Chest 2 View  Result Date: 11/15/2020 CLINICAL DATA:  Chest pain EXAM: CHEST - 2 VIEW COMPARISON:  None. FINDINGS: Normal mediastinum and cardiac silhouette. Normal pulmonary vasculature. No evidence of effusion, infiltrate, or pneumothorax. No acute bony abnormality. IMPRESSION: No acute cardiopulmonary process. Electronically Signed   By: Suzy Bouchard M.D.   On: 11/15/2020 17:29    ____________________________________________   PROCEDURES  Procedure(s) performed (including Critical Care):  .1-3 Lead EKG Interpretation  Date/Time: 11/16/2020 2:03 AM Performed by: Naaman Plummer, MD Authorized by:  Naaman Plummer, MD     Interpretation: normal     ECG rate:  63   ECG rate assessment: normal     Rhythm: sinus rhythm     Ectopy: none     Conduction: normal     ____________________________________________   INITIAL IMPRESSION / ASSESSMENT AND PLAN / ED COURSE  As part of my medical decision making, I reviewed the following data within the electronic medical record, if available:  Nursing notes reviewed and incorporated, Labs reviewed, EKG interpreted, Old chart reviewed, Radiograph reviewed and Notes from prior ED visits reviewed and incorporated        Workup: ECG, CXR, CBC, BMP, Troponin Findings: ECG: No overt evidence of STEMI. No evidence of Brugadas sign, delta wave, epsilon wave, significantly prolonged QTc, or malignant arrhythmia HS Troponin: Negative x1 Other Labs unremarkable for emergent problems. CXR: Without PTX, PNA, or widened mediastinum Last Stress Test:  2021 Last Heart Catheterization: Never HEART Score: 3  Given History, Exam, and Workup I have low suspicion for ACS, Pneumothorax, Pneumonia, Pulmonary Embolus, Tamponade, Aortic Dissection or other emergent problem as a cause for this presentation.   Reassesment: Patient explained findings including PVCs and hypokalemia.  Patient expressed understanding and all questions were  answered.  Prior to discharge patients pain was controlled and they were well appearing.  Disposition:  Discharge. Strict return precautions discussed with patient with full understanding. Advised patient to follow up promptly with primary care provider       ____________________________________________   FINAL CLINICAL IMPRESSION(S) / ED DIAGNOSES  Final diagnoses:  Atypical chest pain  Hypokalemia  Sinus congestion  Palpitations  PVCs (premature ventricular contractions)     ED Discharge Orders          Ordered    potassium chloride (KLOR-CON) 10 MEQ tablet  2 times daily        11/16/20 0157             Note:  This document was prepared using Dragon voice recognition software and may include unintentional dictation errors.    Naaman Plummer, MD 11/16/20 [phone]

## 2020-11-16 NOTE — ED Notes (Signed)
ED Provider at bedside. 

## 2020-11-18 ENCOUNTER — Other Ambulatory Visit: Payer: Self-pay

## 2020-11-18 ENCOUNTER — Other Ambulatory Visit: Payer: Self-pay | Admitting: Oncology

## 2020-11-18 ENCOUNTER — Inpatient Hospital Stay: Payer: PPO

## 2020-11-18 VITALS — BP 128/59 | HR 50 | Temp 97.1°F | Resp 20 | Wt 127.0 lb

## 2020-11-18 DIAGNOSIS — Z5111 Encounter for antineoplastic chemotherapy: Secondary | ICD-10-CM | POA: Diagnosis not present

## 2020-11-18 DIAGNOSIS — C9200 Acute myeloblastic leukemia, not having achieved remission: Secondary | ICD-10-CM

## 2020-11-18 LAB — CBC WITH DIFFERENTIAL/PLATELET
Abs Immature Granulocytes: 0.15 10*3/uL — ABNORMAL HIGH (ref 0.00–0.07)
Basophils Absolute: 0 10*3/uL (ref 0.0–0.1)
Basophils Relative: 1 %
Eosinophils Absolute: 0 10*3/uL (ref 0.0–0.5)
Eosinophils Relative: 1 %
HCT: 38.4 % (ref 36.0–46.0)
Hemoglobin: 12.8 g/dL (ref 12.0–15.0)
Immature Granulocytes: 12 %
Lymphocytes Relative: 60 %
Lymphs Abs: 0.8 10*3/uL (ref 0.7–4.0)
MCH: 27.5 pg (ref 26.0–34.0)
MCHC: 33.3 g/dL (ref 30.0–36.0)
MCV: 82.4 fL (ref 80.0–100.0)
Monocytes Absolute: 0.1 10*3/uL (ref 0.1–1.0)
Monocytes Relative: 7 %
Neutro Abs: 0.2 10*3/uL — CL (ref 1.7–7.7)
Neutrophils Relative %: 19 %
Platelets: 288 10*3/uL (ref 150–400)
RBC: 4.66 MIL/uL (ref 3.87–5.11)
RDW: 15.1 % (ref 11.5–15.5)
Smear Review: NORMAL
WBC: 1.2 10*3/uL — CL (ref 4.0–10.5)
nRBC: 0 % (ref 0.0–0.2)

## 2020-11-18 LAB — COMPREHENSIVE METABOLIC PANEL
ALT: 20 U/L (ref 0–44)
AST: 12 U/L — ABNORMAL LOW (ref 15–41)
Albumin: 3.5 g/dL (ref 3.5–5.0)
Alkaline Phosphatase: 109 U/L (ref 38–126)
Anion gap: 7 (ref 5–15)
BUN: 19 mg/dL (ref 8–23)
CO2: 23 mmol/L (ref 22–32)
Calcium: 9.2 mg/dL (ref 8.9–10.3)
Chloride: 109 mmol/L (ref 98–111)
Creatinine, Ser: 1 mg/dL (ref 0.44–1.00)
GFR, Estimated: >60 mL/min (ref >60)
Glucose, Bld: 109 mg/dL — ABNORMAL HIGH (ref 70–99)
Potassium: 3.6 mmol/L (ref 3.5–5.1)
Sodium: 139 mmol/L (ref 135–145)
Total Bilirubin: 0.5 mg/dL (ref 0.3–1.2)
Total Protein: 6.3 g/dL — ABNORMAL LOW (ref 6.5–8.1)

## 2020-11-18 LAB — MAGNESIUM: Magnesium: 1.7 mg/dL (ref 1.7–2.4)

## 2020-11-18 LAB — SAMPLE TO BLOOD BANK

## 2020-11-18 MED ORDER — AZACITIDINE CHEMO SQ INJECTION
50.0000 mg/m2 | Freq: Once | INTRAMUSCULAR | Status: AC
  Administered 2020-11-18: 87.5 mg via SUBCUTANEOUS
  Filled 2020-11-18: qty 3.5

## 2020-11-18 MED ORDER — ONDANSETRON HCL 4 MG PO TABS
8.0000 mg | ORAL_TABLET | Freq: Once | ORAL | Status: AC
  Administered 2020-11-18: 8 mg via ORAL
  Filled 2020-11-18: qty 2

## 2020-11-18 NOTE — Patient Instructions (Signed)
[facility]  Discharge Instructions: Thank you for choosing [facility] to provide your oncology and hematology care.  If you have a lab appointment with the , please go directly to the [facility] and check in at the registration area.  Wear comfortable clothing and clothing appropriate for easy access to any Portacath or PICC line.   We strive to give you quality time with your provider. You may need to reschedule your appointment if you arrive late (15 or more minutes).  Arriving late affects you and other patients whose appointments are after yours.  Also, if you miss three or more appointments without notifying the office, you may be dismissed from the clinic at the provider's discretion.      For prescription refill requests, have your pharmacy contact our office and allow 72 hours for refills to be completed.    Today you received the following chemotherapy and/or immunotherapy agents: Vidaza      To help prevent nausea and vomiting after your treatment, we encourage you to take your nausea medication as directed.  BELOW ARE SYMPTOMS THAT SHOULD BE REPORTED IMMEDIATELY: *FEVER GREATER THAN 100.4 F (38 C) OR HIGHER *CHILLS OR SWEATING *NAUSEA AND VOMITING THAT IS NOT CONTROLLED WITH YOUR NAUSEA MEDICATION *UNUSUAL SHORTNESS OF BREATH *UNUSUAL BRUISING OR BLEEDING *URINARY PROBLEMS (pain or burning when urinating, or frequent urination) *BOWEL PROBLEMS (unusual diarrhea, constipation, pain near the anus) TENDERNESS IN MOUTH AND THROAT WITH OR WITHOUT PRESENCE OF ULCERS (sore throat, sores in mouth, or a toothache) UNUSUAL RASH, SWELLING OR PAIN  UNUSUAL VAGINAL DISCHARGE OR ITCHING   Items with * indicate a potential emergency and should be followed up as soon as possible or go to the Emergency Department if any problems should occur.  Please show the CHEMOTHERAPY ALERT CARD or IMMUNOTHERAPY ALERT CARD at check-in to  the Emergency Department and triage nurse.  Should you have questions after your visit or need to cancel or reschedule your appointment, please contact [facility]  [phone] and follow the prompts.  Office hours are 8:00 a.m. to 4:30 p.m. Monday - Friday. Please note that voicemails left after 4:00 p.m. may not be returned until the following business day.  We are closed weekends and major holidays. You have access to a nurse at all times for urgent questions. Please call the main number to the clinic [phone] and follow the prompts.  For any non-urgent questions, you may also contact your provider using MyChart. We now offer e-Visits for anyone 81 and older to request care online for non-urgent symptoms. For details visit [email or web address].   Also download the MyChart app! Go to the app store, search "MyChart", open the app, select [facility], and log in with your MyChart username and password.  Due to Covid, a mask is required upon entering the hospital/clinic. If you do not have a mask, one will be given to you upon arrival. For doctor visits, patients may have 1 support person aged 51 or older with them. For treatment visits, patients cannot have anyone with them due to current Covid guidelines and our immunocompromised population. Azacitidine suspension for injection (subcutaneous use) What is this medication? AZACITIDINE (ay [place]) is a chemotherapy drug. This medicine reduces the growth of cancer cells and can suppress the immune system. It is used fortreating myelodysplastic syndrome or some types of leukemia. This medicine may be used for other purposes; ask your health care  provider orpharmacist if you have questions. COMMON BRAND NAME(S): Vidaza What should I tell my care team before I take this medication? They need to know if you have any of these conditions: kidney disease liver disease liver tumors an unusual or allergic  reaction to azacitidine, mannitol, other medicines, foods, dyes, or preservatives pregnant or trying to get pregnant breast-feeding How should I use this medication? This medicine is for injection under the skin. It is administered in a hospitalor clinic by a specially trained health care professional. Talk to your pediatrician regarding the use of this medicine in children. Whilethis drug may be prescribed for selected conditions, precautions do apply. Overdosage: If you think you have taken too much of this medicine contact apoison control center or emergency room at once. NOTE: This medicine is only for you. Do not share this medicine with others. What if I miss a dose? It is important not to miss your dose. Call your doctor or health careprofessional if you are unable to keep an appointment. What may interact with this medication? Interactions have not been studied. Give your health care provider a list of all the medicines, herbs, non-prescription drugs, or dietary supplements you use. Also tell them if you smoke, drink alcohol, or use illegal drugs. Some items may interact with yourmedicine. This list may not describe all possible interactions. Give your health care provider a list of all the medicines, herbs, non-prescription drugs, or dietary supplements you use. Also tell them if you smoke, drink alcohol, or use illegaldrugs. Some items may interact with your medicine. What should I watch for while using this medication? Visit your doctor for checks on your progress. This drug may make you feel generally unwell. This is not uncommon, as chemotherapy can affect healthy cells as well as cancer cells. Report any side effects. Continue your course oftreatment even though you feel ill unless your doctor tells you to stop. In some cases, you may be given additional medicines to help with side effects.Follow all directions for their use. Call your doctor or health care professional for advice if  you get a fever, chills or sore throat, or other symptoms of a cold or flu. Do not treat yourself. This drug decreases your body's ability to fight infections. Try toavoid being around people who are sick. This medicine may increase your risk to bruise or bleed. Call your doctor orhealth care professional if you notice any unusual bleeding. You may need blood work done while you are taking this medicine. Do not become pregnant while taking this medicine and for 6 months after the last dose. Women should inform their doctor if they wish to become pregnant or think they might be pregnant. Men should not father a child while taking this medicine and for 3 months after the last dose. There is a potential for serious side effects to an unborn child. Talk to your health care professional or pharmacist for more information. Do not breast-feed an infant while taking thismedicine and for 1 week after the last dose. This medicine may interfere with the ability to have a child. Talk with yourdoctor or health care professional if you are concerned about your fertility. What side effects may I notice from receiving this medication? Side effects that you should report to your doctor or health care professionalas soon as possible: allergic reactions like skin rash, itching or hives, swelling of the face, lips, or tongue low blood counts - this medicine may decrease the number of white blood cells, red  blood cells and platelets. You may be at increased risk for infections and bleeding. signs of infection - fever or chills, cough, sore throat, pain passing urine signs of decreased platelets or bleeding - bruising, pinpoint red spots on the skin, black, tarry stools, blood in the urine signs of decreased red blood cells - unusually weak or tired, fainting spells, lightheadedness signs and symptoms of kidney injury like trouble passing urine or change in the amount of urine signs and symptoms of liver injury like dark  yellow or brown urine; general ill feeling or flu-like symptoms; light-colored stools; loss of appetite; nausea; right upper belly pain; unusually weak or tired; yellowing of the eyes or skin Side effects that usually do not require medical attention (report to yourdoctor or health care professional if they continue or are bothersome): constipation diarrhea nausea, vomiting pain or redness at the injection site unusually weak or tired This list may not describe all possible side effects. Call your doctor for medical advice about side effects. You may report side effects to FDA at1-800-FDA-1088. Where should I keep my medication? This drug is given in a hospital or clinic and will not be stored at home. NOTE: This sheet is a summary. It may not cover all possible information. If you have questions about this medicine, talk to your doctor, pharmacist, orhealth care provider.  2022 Elsevier/Gold Standard (2016-05-19 14:37:51)

## 2020-11-19 ENCOUNTER — Inpatient Hospital Stay: Payer: PPO

## 2020-11-19 ENCOUNTER — Encounter: Payer: Self-pay | Admitting: Oncology

## 2020-11-19 ENCOUNTER — Inpatient Hospital Stay (HOSPITAL_BASED_OUTPATIENT_CLINIC_OR_DEPARTMENT_OTHER): Payer: PPO | Admitting: Oncology

## 2020-11-19 VITALS — BP 119/65 | HR 80 | Temp 97.8°F | Resp 18

## 2020-11-19 VITALS — BP 108/70 | HR 112 | Temp 97.9°F | Resp 18 | Wt 126.8 lb

## 2020-11-19 DIAGNOSIS — C9200 Acute myeloblastic leukemia, not having achieved remission: Secondary | ICD-10-CM | POA: Diagnosis not present

## 2020-11-19 DIAGNOSIS — Z5111 Encounter for antineoplastic chemotherapy: Secondary | ICD-10-CM | POA: Diagnosis not present

## 2020-11-19 MED ORDER — ONDANSETRON HCL 4 MG PO TABS
8.0000 mg | ORAL_TABLET | Freq: Once | ORAL | Status: AC
  Administered 2020-11-19: 8 mg via ORAL
  Filled 2020-11-19: qty 2

## 2020-11-19 MED ORDER — AZACITIDINE CHEMO SQ INJECTION
50.0000 mg/m2 | Freq: Once | INTRAMUSCULAR | Status: AC
  Administered 2020-11-19: 87.5 mg via SUBCUTANEOUS
  Filled 2020-11-19: qty 3.5

## 2020-11-19 NOTE — Progress Notes (Signed)
Recent hospitalization in ED for hypokalemia. Given prescription for potassium.

## 2020-11-19 NOTE — Patient Instructions (Signed)
[facility]COLOG[facility]  Discharge Instructions: Thank you for choosing [facility] to provide your oncology and hematology care.  If you have a lab appointment with the [facility], please go directly to the [facility] and check in at the registration area.  Wear comfortable clothing and clothing appropriate for easy access to any Portacath or PICC line.   We strive to give you quality time with your provider. You may need to reschedule your appointment if you arrive late (15 or more minutes).  Arriving late affects you and other patients whose appointments are after yours.  Also, if you miss three or more appointments without notifying the office, you may be dismissed from the clinic at the provider's discretion.      For prescription refill requests, have your pharmacy contact our office and allow 72 hours for refills to be completed.    Today you received the following chemotherapy and/or immunotherapy agents Vidaza      To help prevent nausea and vomiting after your treatment, we encourage you to take your nausea medication as directed.  BELOW ARE SYMPTOMS THAT SHOULD BE REPORTED IMMEDIATELY: *FEVER GREATER THAN 100.4 F (38 C) OR HIGHER *CHILLS OR SWEATING *NAUSEA AND VOMITING THAT IS NOT CONTROLLED WITH YOUR NAUSEA MEDICATION *UNUSUAL SHORTNESS OF BREATH *UNUSUAL BRUISING OR BLEEDING *URINARY PROBLEMS (pain or burning when urinating, or frequent urination) *BOWEL PROBLEMS (unusual diarrhea, constipation, pain near the anus) TENDERNESS IN MOUTH AND THROAT WITH OR WITHOUT PRESENCE OF ULCERS (sore throat, sores in mouth, or a toothache) UNUSUAL RASH, SWELLING OR PAIN  UNUSUAL VAGINAL DISCHARGE OR ITCHING   Items with * indicate a potential emergency and should be followed up as soon as possible or go to the Emergency Department if any problems should occur.  Please show the CHEMOTHERAPY ALERT CARD or IMMUNOTHERAPY ALERT CARD at check-in to  the Emergency Department and triage nurse.  Should you have questions after your visit or need to cancel or reschedule your appointment, please contact [facility]  [phone] and follow the prompts.  Office hours are 8:00 a.m. to 4:30 p.m. Monday - Friday. Please note that voicemails left after 4:00 p.m. may not be returned until the following business day.  We are closed weekends and major holidays. You have access to a nurse at all times for urgent questions. Please call the main number to the clinic [phone] and follow the prompts.  For any non-urgent questions, you may also contact your provider using MyChart. We now offer e-Visits for anyone 3 and older to request care online for non-urgent symptoms. For details visit [email or web address].   Also download the MyChart app! Go to the app store, search "MyChart", open the app, select [facility], and log in with your MyChart username and password.  Due to Covid, a mask is required upon entering the hospital/clinic. If you do not have a mask, one will be given to you upon arrival. For doctor visits, patients may have 1 support person aged 64 or older with them. For treatment visits, patients cannot have anyone with them due to current Covid guidelines and our immunocompromised population.

## 2020-11-19 NOTE — Progress Notes (Signed)
11/18/20 labs: ANC 0.2 and WBC 1.2 per Dr. [name] okay to proceed with Vidaza treatment as scheduled.  Vidaza given 11/18/20 at 1448, Per Elmon Else RPH okay to give Vidaza at this time.

## 2020-11-19 NOTE — Progress Notes (Signed)
[facility]  [phone] [phone]) [phone]  ID: CLARAMAE RIGDON OB: 1950-05-15  MR#: [id number]  CSN#:[id number]  Patient Care Team: [name], MD as PCP - General (Internal Medicine) Wellington Hampshire, MD as PCP - Cardiology (Cardiology)  CHIEF COMPLAINT: AML.  INTERVAL HISTORY: Patient returns to clinic today for further evaluation and continuation of cycle 7 of Vidaza.  She continues to have chronic weakness and fatigue, but otherwise feels well and is tolerating her treatments. She has no neurologic complaints.  She denies any recent fevers or illnesses. She has no chest pain, shortness of breath, cough, or hemoptysis.  She denies any nausea, vomiting, constipation, or diarrhea.  She has no urinary complaints.  Patient offers no further specific complaints today.  REVIEW OF SYSTEMS:   Review of Systems  Constitutional:  Positive for malaise/fatigue. Negative for fever and weight loss.  Respiratory: Negative.  Negative for cough, hemoptysis and shortness of breath.   Cardiovascular: Negative.  Negative for chest pain and leg swelling.  Gastrointestinal:  Positive for diarrhea. Negative for abdominal pain.  Genitourinary: Negative.  Negative for dysuria.  Musculoskeletal: Negative.  Negative for back pain.  Skin: Negative.  Negative for rash.  Neurological:  Positive for weakness. Negative for dizziness, focal weakness and headaches.  Psychiatric/Behavioral:  Positive for memory loss. The patient is not nervous/anxious.    As per HPI. Otherwise, a complete review of systems is negative.  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   s/p surgery and XRT   Osteoporosis    Other and unspecified hyperlipidemia    Palpitations    Personal history of chemotherapy    Personal history of malignant neoplasm of large intestine    carcinoma - cecum, s/p right laparoscopic colectomy - s/p chemotherapy and XRT   Personal history of radiation therapy    Pneumonia 2019   PONV (postoperative nausea and vomiting)    Pure hypercholesterolemia    Unspecified hereditary and idiopathic peripheral neuropathy     PAST SURGICAL HISTORY: Past Surgical History:  Procedure Laterality Date   APPENDECTOMY  1985   BREAST BIOPSY Right 2009   positive   BREAST BIOPSY Right 2009   negative   BREAST LUMPECTOMY Right 2009   breast cancer   CHOLECYSTECTOMY  1995   COLONOSCOPY     COLONOSCOPY WITH PROPOFOL N/A 10/29/2017   Procedure: COLONOSCOPY WITH PROPOFOL;  Surgeon: Manya Silvas, MD;  Location: [facility] ENDOSCOPY;  Service: Endoscopy;  Laterality: N/A;   DILATION AND CURETTAGE OF UTERUS  1990   DILATION AND CURETTAGE, DIAGNOSTIC / THERAPEUTIC  1990   ESOPHAGOGASTRODUODENOSCOPY     ESOPHAGOGASTRODUODENOSCOPY (EGD) WITH PROPOFOL N/A 10/29/2017   Procedure: ESOPHAGOGASTRODUODENOSCOPY (EGD) WITH PROPOFOL;  Surgeon: Manya Silvas, MD;  Location[facility] ENDOSCOPY;  Service: Endoscopy;  Laterality: N/A;   INCISION AND DRAINAGE PERIRECTAL ABSCESS N/A 06/17/2020   Procedure: IRRIGATION AND DEBRIDEMENT PERIRECTAL ABSCESS;  Surgeon: Jules Husbands, MD;  Location: [facility] ORS;  Service: General;  Laterality: N/A;   LAPAROSCOPIC PARTIAL COLECTOMY     stage 3-C carcinoma of the cecum, s/p chemotherapy and xrt   LITHOTRIPSY     SIGMOIDOSCOPY  08/26/1993   THYROID LOBECTOMY  2002   s/p XRT   TOTAL HIP ARTHROPLASTY Right 10/24/2019   Procedure: TOTAL HIP ARTHROPLASTY;  Surgeon: Corky Mull, MD;  Location: [facility]  ORS;  Service: Orthopedics;  Laterality: Right;    FAMILY HISTORY: Family History  Problem Relation Age of Onset   Stroke Mother        68s   Alzheimer's disease Mother    Cancer Mother    Lung cancer Father    Prostate cancer Father    Cancer Father        Colon   Colon cancer Father    Breast cancer Sister        61's   Lung cancer Sister    Breast cancer Maternal Aunt     ADVANCED DIRECTIVES (Y/N):  N  HEALTH MAINTENANCE: Social History   Tobacco Use   Smoking status: Never   Smokeless tobacco: Never  Vaping Use   Vaping Use: Never used  Substance Use Topics   Alcohol use: No    Alcohol/week: 0.0 standard drinks   Drug use: No     Colonoscopy:  PAP:  Bone density:  Lipid panel:  Allergies  Allergen Reactions   Demeclocycline Other (See Comments)    Throat swells   Sulfa Antibiotics     Other reaction(s): Other (See Comments) Throat swells   Tetracyclines & Related Other (See Comments)    Throat swells    Augmentin [Amoxicillin-Pot Clavulanate] Diarrhea   Bentyl [Dicyclomine Hcl]     unkn   Ciprofloxacin Diarrhea   Codeine Other (See Comments)    dizziness     Epinephrine     Speeds heart up - panic    Flagyl [Metronidazole] Nausea And Vomiting   Librax [Chlordiazepoxide-Clidinium]     unkn   Novocain [Procaine] Other (See Comments)    "Shaky"   Phenobarbital     unkn   Prednisone     Nervous     Ultram [Tramadol] Other (See Comments)    Sick feeling    Current Outpatient Medications  Medication Sig Dispense Refill   clonazePAM (KLONOPIN) 0.5 MG tablet Take 1.5 mg by mouth 2 (two) times daily.   0   diphenoxylate-atropine (LOMOTIL) 2.5-0.025 MG tablet Take 1 tablet by mouth 4 (four) times daily as needed for diarrhea or loose stools. 120 tablet 1   escitalopram (LEXAPRO) 20 MG tablet Take 20 mg by mouth every morning.      levothyroxine (SYNTHROID) 125 MCG tablet Take 125 mcg by mouth daily before breakfast.     metoprolol tartrate  (LOPRESSOR) 25 MG tablet TAKE 1 TABLET (25 MG TOTAL) BY MOUTH AS NEEDED (AS NEEDED UP TO TWICE DAILY FOR PALPITATIONS). 60 tablet 1   pantoprazole (PROTONIX) 40 MG tablet TAKE 1 TABLET BY MOUTH EVERY DAY 90 tablet 2   potassium chloride (KLOR-CON) 10 MEQ tablet Take 1 tablet (10 mEq total) by mouth 2 (two) times daily for 14 days. 28 tablet 0   valACYclovir (VALTREX) 500 MG tablet Take 1 tablet (500 mg total) by mouth daily. 30 tablet 2   venetoclax (VENCLEXTA) 100 MG tablet Take 400 mg by mouth daily. Take for 14 days, then hold for 14 days. Repeat every 28  days. Take with a meal and water.     Current Facility-Administered Medications  Medication Dose Route Frequency Provider Last Rate Last Admin   ondansetron (ZOFRAN) tablet 8 mg  8 mg Oral Once Guse, Jacquelynn Cree, FNP       Facility-Administered Medications Ordered in Other Visits  Medication Dose Route Frequency Provider Last Rate Last Admin   potassium chloride (KLOR-CON) CR tablet 40 mEq  40 mEq Oral BID Lloyd Huger, MD   40 mEq at 07/08/20 1136    OBJECTIVE: Vitals:   11/19/20 1121  BP: 108/70  Pulse: (!) 112  Resp: 18  Temp: 97.9 F (36.6 C)     Body mass index is 21.43 kg/m.    ECOG FS:1 - Symptomatic but completely ambulatory  General: Well-developed, well-nourished, no acute distress. Eyes: Pink conjunctiva, anicteric sclera. HEENT: Normocephalic, moist mucous membranes. Lungs: No audible wheezing or coughing. Heart: Regular rate and rhythm. Abdomen: Soft, nontender, no obvious distention. Musculoskeletal: No edema, cyanosis, or clubbing. Neuro: Alert, answering all questions appropriately. Cranial nerves grossly intact. Skin: No rashes or petechiae noted. Psych: Normal affect.   LAB RESULTS:  Lab Results  Component Value Date   NA 139 11/18/2020   K 3.6 11/18/2020   CL 109 11/18/2020   CO2 23 11/18/2020   GLUCOSE 109 (H) 11/18/2020   BUN 19 11/18/2020   CREATININE 1.00 11/18/2020   CALCIUM 9.2  11/18/2020   PROT 6.3 (L) 11/18/2020   ALBUMIN 3.5 11/18/2020   AST 12 (L) 11/18/2020   ALT 20 11/18/2020   ALKPHOS 109 11/18/2020   BILITOT 0.5 11/18/2020   GFRNONAA >60 11/18/2020   GFRAA 54 (L) 10/26/2019    Lab Results  Component Value Date   WBC 1.2 (LL) 11/18/2020   NEUTROABS 0.2 (LL) 11/18/2020   HGB 12.8 11/18/2020   HCT 38.4 11/18/2020   MCV 82.4 11/18/2020   PLT 288 11/18/2020     STUDIES: DG Chest 2 View  Result Date: 11/15/2020 CLINICAL DATA:  Chest pain EXAM: CHEST - 2 VIEW COMPARISON:  None. FINDINGS: Normal mediastinum and cardiac silhouette. Normal pulmonary vasculature. No evidence of effusion, infiltrate, or pneumothorax. No acute bony abnormality. IMPRESSION: No acute cardiopulmonary process. Electronically Signed   By: Suzy Bouchard M.D.   On: 11/15/2020 17:29     ASSESSMENT: AML.  PLAN:    1.  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  Her peripheral blood flow cytometry also revealed 6% aberrant myeloblasts.  Patient completed cycle 1 of induction therapy at [facility] with Vidaza and venetoclax.  She only received 5 days of Vidaza for cycle 2 recently which was interrupted secondary to significant diarrhea and a perirectal abscess. Repeat bone marrow biopsy on July 16, 2020 did not reveal any residual disease, but cytogenetics remained positive likely indicating a very good partial remission.  Given patient's difficulty with treatment and significant diarrhea venetoclax was discontinued altogether and will proceed with dose reduced Vidaza on days 1 through 5 on a 28-day cycle.  Proceed with cycle 7, day 2 of treatment today.  Return to clinic daily for the remainder of the week to complete cycle 7.  Patient will then return to clinic in 2 weeks for laboratory work only and then in 4 weeks for further evaluation and consideration of cycle 8, day 1. Repeat bone marrow biopsy in September 2022.   2.  Neutropenia: Chronic and unchanged.  Discontinue venetoclax and dose  reduce Vidaza as above. 3.  Thrombocytopenia: Resolved. 4.  Perirectal  abscess: Resolved.   5.  Diarrhea: Patient was given a refill of Lomotil today. 6.  Supportive care: Patient has been given referrals to home health and home palliative care.  Appreciate palliative care input.   7. 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  Patient also received chemotherapy and likely received Adriamycin, but exact regimen is unknown.  She completed 5 years of hormonal therapy in approximately June 2015. Currently, she has no evidence of disease.  Her most recent mammogram on March 05, 2020 was reported as BI-RADS 1.  Repeat in November 2022. 8.  History of colon cancer: Patient also states that she received chemotherapy for this, possibly FOLFOX but again this is unknown.  I spent a total of 30 minutes reviewing chart data, face-to-face evaluation with the patient, counseling and coordination of care as detailed above.  Patient expressed understanding and was in agreement with this plan. She also understands that She can call clinic at any time with any questions, concerns, or complaints.   Cancer Staging History of breast cancer Staging form: Breast, AJCC 7th Edition - Clinical stage from 01/07/2016: Stage IA (T1c, N0, M0) - Signed by Lloyd Huger, MD on 01/07/2016 Laterality: Right Estrogen receptor status: Positive Progesterone receptor status: Positive HER2 status: Negative   Lloyd Huger, MD   11/21/2020 9:01 AM

## 2020-11-20 ENCOUNTER — Inpatient Hospital Stay: Payer: PPO

## 2020-11-20 ENCOUNTER — Other Ambulatory Visit: Payer: Self-pay

## 2020-11-20 VITALS — BP 120/63 | HR 88 | Temp 98.6°F

## 2020-11-20 DIAGNOSIS — C9200 Acute myeloblastic leukemia, not having achieved remission: Secondary | ICD-10-CM

## 2020-11-20 DIAGNOSIS — Z5111 Encounter for antineoplastic chemotherapy: Secondary | ICD-10-CM | POA: Diagnosis not present

## 2020-11-20 MED ORDER — AZACITIDINE CHEMO SQ INJECTION
50.0000 mg/m2 | Freq: Once | INTRAMUSCULAR | Status: AC
  Administered 2020-11-20: 87.5 mg via SUBCUTANEOUS
  Filled 2020-11-20: qty 3.5

## 2020-11-20 MED ORDER — DIPHENOXYLATE-ATROPINE 2.5-0.025 MG PO TABS: 1.0000 | ORAL_TABLET | Freq: Four times a day (QID) | ORAL | 1 refills | Status: DC | PRN

## 2020-11-20 MED ORDER — ONDANSETRON HCL 4 MG PO TABS
8.0000 mg | ORAL_TABLET | Freq: Once | ORAL | Status: AC
  Administered 2020-11-20: 8 mg via ORAL
  Filled 2020-11-20: qty 2

## 2020-11-20 NOTE — Patient Instructions (Signed)
[facility] ONCOLOGY  Discharge Instructions: Thank you for choosing [facility] to provide your oncology and hematology care.  If you have a lab appointment with the [facility], please go directly to the [facility] and check in at the registration area.  Wear comfortable clothing and clothing appropriate for easy access to any Portacath or PICC line.   We strive to give you quality time with your provider. You may need to reschedule your appointment if you arrive late (15 or more minutes).  Arriving late affects you and other patients whose appointments are after yours.  Also, if you miss three or more appointments without notifying the office, you may be dismissed from the clinic at the provider's discretion.      For prescription refill requests, have your pharmacy contact our office and allow 72 hours for refills to be completed.    Today you received the following chemotherapy and/or immunotherapy agents : Vidaza   To help prevent nausea and vomiting after your treatment, we encourage you to take your nausea medication as directed.  BELOW ARE SYMPTOMS THAT SHOULD BE REPORTED IMMEDIATELY: *FEVER GREATER THAN 100.4 F (38 C) OR HIGHER *CHILLS OR SWEATING *NAUSEA AND VOMITING THAT IS NOT CONTROLLED WITH YOUR NAUSEA MEDICATION *UNUSUAL SHORTNESS OF BREATH *UNUSUAL BRUISING OR BLEEDING *URINARY PROBLEMS (pain or burning when urinating, or frequent urination) *BOWEL PROBLEMS (unusual diarrhea, constipation, pain near the anus) TENDERNESS IN MOUTH AND THROAT WITH OR WITHOUT PRESENCE OF ULCERS (sore throat, sores in mouth, or a toothache) UNUSUAL RASH, SWELLING OR PAIN  UNUSUAL VAGINAL DISCHARGE OR ITCHING   Items with * indicate a potential emergency and should be followed up as soon as possible or go to the Emergency Department if any problems should occur.  Please show the CHEMOTHERAPY ALERT CARD or IMMUNOTHERAPY ALERT CARD at check-in to  the Emergency Department and triage nurse.  Should you have questions after your visit or need to cancel or reschedule your appointment, please contact [facility]  [phone] and follow the prompts.  Office hours are 8:00 a.m. to 4:30 p.m. Monday - Friday. Please note that voicemails left after 4:00 p.m. may not be returned until the following business day.  We are closed weekends and major holidays. You have access to a nurse at all times for urgent questions. Please call the main number to the clinic [phone] and follow the prompts.  For any non-urgent questions, you may also contact your provider using MyChart. We now offer e-Visits for anyone 85 and older to request care online for non-urgent symptoms. For details visit [email or web address].   Also download the MyChart app! Go to the app store, search "MyChart", open the app, select [facility], and log in with your MyChart username and password.  Due to Covid, a mask is required upon entering the hospital/clinic. If you do not have a mask, one will be given to you upon arrival. For doctor visits, patients may have 1 support person aged 71 or older with them. For treatment visits, patients cannot have anyone with them due to current Covid guidelines and our immunocompromised population.

## 2020-11-21 ENCOUNTER — Inpatient Hospital Stay: Payer: PPO

## 2020-11-21 ENCOUNTER — Encounter: Payer: Self-pay | Admitting: Oncology

## 2020-11-21 VITALS — BP 114/65 | HR 80 | Temp 97.7°F | Resp 16

## 2020-11-21 DIAGNOSIS — C9200 Acute myeloblastic leukemia, not having achieved remission: Secondary | ICD-10-CM

## 2020-11-21 DIAGNOSIS — Z5111 Encounter for antineoplastic chemotherapy: Secondary | ICD-10-CM | POA: Diagnosis not present

## 2020-11-21 MED ORDER — ONDANSETRON HCL 4 MG PO TABS
8.0000 mg | ORAL_TABLET | Freq: Once | ORAL | Status: AC
  Administered 2020-11-21: 8 mg via ORAL
  Filled 2020-11-21: qty 2

## 2020-11-21 MED ORDER — AZACITIDINE CHEMO SQ INJECTION
50.0000 mg/m2 | Freq: Once | INTRAMUSCULAR | Status: AC
  Administered 2020-11-21: 87.5 mg via SUBCUTANEOUS
  Filled 2020-11-21: qty 3.5

## 2020-11-21 NOTE — Patient Instructions (Signed)
[facility] ONCOLOGY  Discharge Instructions: Thank you for choosing [facility] to provide your oncology and hematology care.  If you have a lab appointment with the [facility], please go directly to the [facility] and check in at the registration area.  Wear comfortable clothing and clothing appropriate for easy access to any Portacath or PICC line.   We strive to give you quality time with your provider. You may need to reschedule your appointment if you arrive late (15 or more minutes).  Arriving late affects you and other patients whose appointments are after yours.  Also, if you miss three or more appointments without notifying the office, you may be dismissed from the clinic at the provider's discretion.      For prescription refill requests, have your pharmacy contact our office and allow 72 hours for refills to be completed.    Today you received the following chemotherapy and/or immunotherapy agents - vidaza      To help prevent nausea and vomiting after your treatment, we encourage you to take your nausea medication as directed.  BELOW ARE SYMPTOMS THAT SHOULD BE REPORTED IMMEDIATELY: *FEVER GREATER THAN 100.4 F (38 C) OR HIGHER *CHILLS OR SWEATING *NAUSEA AND VOMITING THAT IS NOT CONTROLLED WITH YOUR NAUSEA MEDICATION *UNUSUAL SHORTNESS OF BREATH *UNUSUAL BRUISING OR BLEEDING *URINARY PROBLEMS (pain or burning when urinating, or frequent urination) *BOWEL PROBLEMS (unusual diarrhea, constipation, pain near the anus) TENDERNESS IN MOUTH AND THROAT WITH OR WITHOUT PRESENCE OF ULCERS (sore throat, sores in mouth, or a toothache) UNUSUAL RASH, SWELLING OR PAIN  UNUSUAL VAGINAL DISCHARGE OR ITCHING   Items with * indicate a potential emergency and should be followed up as soon as possible or go to the Emergency Department if any problems should occur.  Please show the CHEMOTHERAPY ALERT CARD or IMMUNOTHERAPY ALERT CARD at check-in to  the Emergency Department and triage nurse.  Should you have questions after your visit or need to cancel or reschedule your appointment, please contact [facility]  [phone] and follow the prompts.  Office hours are 8:00 a.m. to 4:30 p.m. Monday - Friday. Please note that voicemails left after 4:00 p.m. may not be returned until the following business day.  We are closed weekends and major holidays. You have access to a nurse at all times for urgent questions. Please call the main number to the clinic [phone] and follow the prompts.  For any non-urgent questions, you may also contact your provider using MyChart. We now offer e-Visits for anyone 63 and older to request care online for non-urgent symptoms. For details visit [email or web address].   Also download the MyChart app! Go to the app store, search "MyChart", open the app, select [facility], and log in with your MyChart username and password.  Due to Covid, a mask is required upon entering the hospital/clinic. If you do not have a mask, one will be given to you upon arrival. For doctor visits, patients may have 1 support person aged 49 or older with them. For treatment visits, patients cannot have anyone with them due to current Covid guidelines and our immunocompromised population.   Azacitidine suspension for injection (subcutaneous use) What is this medication? AZACITIDINE (ay [place]) is a chemotherapy drug. This medicine reduces the growth of cancer cells and can suppress the immune system. It is used fortreating myelodysplastic syndrome or some types of leukemia. This medicine may be used for other purposes; ask  your health care provider orpharmacist if you have questions. COMMON BRAND NAME(S): Vidaza What should I tell my care team before I take this medication? They need to know if you have any of these conditions: kidney disease liver disease liver tumors an unusual or allergic  reaction to azacitidine, mannitol, other medicines, foods, dyes, or preservatives pregnant or trying to get pregnant breast-feeding How should I use this medication? This medicine is for injection under the skin. It is administered in a hospitalor clinic by a specially trained health care professional. Talk to your pediatrician regarding the use of this medicine in children. Whilethis drug may be prescribed for selected conditions, precautions do apply. Overdosage: If you think you have taken too much of this medicine contact apoison control center or emergency room at once. NOTE: This medicine is only for you. Do not share this medicine with others. What if I miss a dose? It is important not to miss your dose. Call your doctor or health careprofessional if you are unable to keep an appointment. What may interact with this medication? Interactions have not been studied. Give your health care provider a list of all the medicines, herbs, non-prescription drugs, or dietary supplements you use. Also tell them if you smoke, drink alcohol, or use illegal drugs. Some items may interact with yourmedicine. This list may not describe all possible interactions. Give your health care provider a list of all the medicines, herbs, non-prescription drugs, or dietary supplements you use. Also tell them if you smoke, drink alcohol, or use illegaldrugs. Some items may interact with your medicine. What should I watch for while using this medication? Visit your doctor for checks on your progress. This drug may make you feel generally unwell. This is not uncommon, as chemotherapy can affect healthy cells as well as cancer cells. Report any side effects. Continue your course oftreatment even though you feel ill unless your doctor tells you to stop. In some cases, you may be given additional medicines to help with side effects.Follow all directions for their use. Call your doctor or health care professional for advice if  you get a fever, chills or sore throat, or other symptoms of a cold or flu. Do not treat yourself. This drug decreases your body's ability to fight infections. Try toavoid being around people who are sick. This medicine may increase your risk to bruise or bleed. Call your doctor orhealth care professional if you notice any unusual bleeding. You may need blood work done while you are taking this medicine. Do not become pregnant while taking this medicine and for 6 months after the last dose. Women should inform their doctor if they wish to become pregnant or think they might be pregnant. Men should not father a child while taking this medicine and for 3 months after the last dose. There is a potential for serious side effects to an unborn child. Talk to your health care professional or pharmacist for more information. Do not breast-feed an infant while taking thismedicine and for 1 week after the last dose. This medicine may interfere with the ability to have a child. Talk with yourdoctor or health care professional if you are concerned about your fertility. What side effects may I notice from receiving this medication? Side effects that you should report to your doctor or health care professionalas soon as possible: allergic reactions like skin rash, itching or hives, swelling of the face, lips, or tongue low blood counts - this medicine may decrease the number of white  blood cells, red blood cells and platelets. You may be at increased risk for infections and bleeding. signs of infection - fever or chills, cough, sore throat, pain passing urine signs of decreased platelets or bleeding - bruising, pinpoint red spots on the skin, black, tarry stools, blood in the urine signs of decreased red blood cells - unusually weak or tired, fainting spells, lightheadedness signs and symptoms of kidney injury like trouble passing urine or change in the amount of urine signs and symptoms of liver injury like dark  yellow or brown urine; general ill feeling or flu-like symptoms; light-colored stools; loss of appetite; nausea; right upper belly pain; unusually weak or tired; yellowing of the eyes or skin Side effects that usually do not require medical attention (report to yourdoctor or health care professional if they continue or are bothersome): constipation diarrhea nausea, vomiting pain or redness at the injection site unusually weak or tired This list may not describe all possible side effects. Call your doctor for medical advice about side effects. You may report side effects to FDA at1-800-FDA-1088. Where should I keep my medication? This drug is given in a hospital or clinic and will not be stored at home. NOTE: This sheet is a summary. It may not cover all possible information. If you have questions about this medicine, talk to your doctor, pharmacist, orhealth care provider.  2022 Elsevier/Gold Standard (2016-05-19 14:37:51)

## 2020-11-22 ENCOUNTER — Inpatient Hospital Stay: Payer: PPO

## 2020-11-22 ENCOUNTER — Other Ambulatory Visit: Payer: Self-pay

## 2020-11-22 VITALS — BP 116/66 | HR 77 | Temp 98.0°F | Resp 18

## 2020-11-22 DIAGNOSIS — C9200 Acute myeloblastic leukemia, not having achieved remission: Secondary | ICD-10-CM

## 2020-11-22 DIAGNOSIS — Z5111 Encounter for antineoplastic chemotherapy: Secondary | ICD-10-CM | POA: Diagnosis not present

## 2020-11-22 MED ORDER — AZACITIDINE CHEMO SQ INJECTION
50.0000 mg/m2 | Freq: Once | INTRAMUSCULAR | Status: AC
  Administered 2020-11-22: 87.5 mg via SUBCUTANEOUS
  Filled 2020-11-22: qty 3.5

## 2020-11-22 MED ORDER — ONDANSETRON HCL 4 MG PO TABS
8.0000 mg | ORAL_TABLET | Freq: Once | ORAL | Status: AC
  Administered 2020-11-22: 8 mg via ORAL
  Filled 2020-11-22: qty 2

## 2020-11-22 NOTE — Patient Instructions (Signed)
[facility]  Discharge Instructions: Thank you for choosing [facility] to provide your oncology and hematology care.  If you have a lab appointment with the [facility], please go directly to the [facility] and check in at the registration area.  Wear comfortable clothing and clothing appropriate for easy access to any Portacath or PICC line.   We strive to give you quality time with your provider. You may need to reschedule your appointment if you arrive late (15 or more minutes).  Arriving late affects you and other patients whose appointments are after yours.  Also, if you miss three or more appointments without notifying the office, you may be dismissed from the clinic at the provider's discretion.      For prescription refill requests, have your pharmacy contact our office and allow 72 hours for refills to be completed.    Today you received the following chemotherapy and/or immunotherapy agents vidaza   To help prevent nausea and vomiting after your treatment, we encourage you to take your nausea medication as directed.  BELOW ARE SYMPTOMS THAT SHOULD BE REPORTED IMMEDIATELY: *FEVER GREATER THAN 100.4 F (38 C) OR HIGHER *CHILLS OR SWEATING *NAUSEA AND VOMITING THAT IS NOT CONTROLLED WITH YOUR NAUSEA MEDICATION *UNUSUAL SHORTNESS OF BREATH *UNUSUAL BRUISING OR BLEEDING *URINARY PROBLEMS (pain or burning when urinating, or frequent urination) *BOWEL PROBLEMS (unusual diarrhea, constipation, pain near the anus) TENDERNESS IN MOUTH AND THROAT WITH OR WITHOUT PRESENCE OF ULCERS (sore throat, sores in mouth, or a toothache) UNUSUAL RASH, SWELLING OR PAIN  UNUSUAL VAGINAL DISCHARGE OR ITCHING   Items with * indicate a potential emergency and should be followed up as soon as possible or go to the Emergency Department if any problems should occur.  Please show the CHEMOTHERAPY ALERT CARD or IMMUNOTHERAPY ALERT CARD at check-in to the  Emergency Department and triage nurse.  Should you have questions after your visit or need to cancel or reschedule your appointment, please contact [facility]  [phone] and follow the prompts.  Office hours are 8:00 a.m. to 4:30 p.m. Monday - Friday. Please note that voicemails left after 4:00 p.m. may not be returned until the following business day.  We are closed weekends and major holidays. You have access to a nurse at all times for urgent questions. Please call the main number to the clinic [phone] and follow the prompts.  For any non-urgent questions, you may also contact your provider using MyChart. We now offer e-Visits for anyone 15 and older to request care online for non-urgent symptoms. For details visit [email or web address].   Also download the MyChart app! Go to the app store, search "MyChart", open the app, select [facility], and log in with your MyChart username and password.  Due to Covid, a mask is required upon entering the hospital/clinic. If you do not have a mask, one will be given to you upon arrival. For doctor visits, patients may have 1 support person aged 35 or older with them. For treatment visits, patients cannot have anyone with them due to current Covid guidelines and our immunocompromised population.

## 2020-11-22 NOTE — Progress Notes (Signed)
Nutrition Follow-up:  Patient with AML and receiving vidaza.    Met with patient during infusion.  Patient says that her appetite has been decreased over the last week, felt nauseated and not eating very nutritious.  Nursing giving nausea medication.  Has not tried shakes and forgot that she had them.  Asked RD to give her more samples.  Says that she has been eating toast, some cereal and hamburger last night.  Can't remember anything else she has eaten.  Can't tolerate diary foods.      Medications: reviewed  Labs: reviewed  Anthropometrics:   Weight 126 lb 12.8 oz on 7/19  128 lb 8 oz on 6/21   NUTRITION DIAGNOSIS: Inadequate oral intake continues with weight loss    INTERVENTION:  RD provided samples of orgain vegan (dairy free shake), boost soothe and ensure clear for patient to try Encouraged small frequent mini meals Provided handout on nausea and vomiting with food list for foods to choose.     MONITORING, EVALUATION, GOAL: weight trends, intake   NEXT VISIT: Friday, August 19th during infusion   B. Zenia Resides, [place], [place] Registered Dietitian [phone] (mobile)

## 2020-11-28 DIAGNOSIS — C9200 Acute myeloblastic leukemia, not having achieved remission: Principal | ICD-10-CM

## 2020-11-28 MED ORDER — VENCLEXTA 100 MG TABLET
ORAL_TABLET | 2 refills | 0 days
Start: 2020-11-28 — End: ?

## 2020-12-02 ENCOUNTER — Inpatient Hospital Stay: Payer: PPO | Attending: Oncology

## 2020-12-02 DIAGNOSIS — R197 Diarrhea, unspecified: Secondary | ICD-10-CM | POA: Insufficient documentation

## 2020-12-02 DIAGNOSIS — Z85038 Personal history of other malignant neoplasm of large intestine: Secondary | ICD-10-CM | POA: Insufficient documentation

## 2020-12-02 DIAGNOSIS — E039 Hypothyroidism, unspecified: Secondary | ICD-10-CM | POA: Insufficient documentation

## 2020-12-02 DIAGNOSIS — Z85850 Personal history of malignant neoplasm of thyroid: Secondary | ICD-10-CM | POA: Diagnosis not present

## 2020-12-02 DIAGNOSIS — Z5111 Encounter for antineoplastic chemotherapy: Secondary | ICD-10-CM | POA: Diagnosis not present

## 2020-12-02 DIAGNOSIS — C9200 Acute myeloblastic leukemia, not having achieved remission: Secondary | ICD-10-CM | POA: Diagnosis not present

## 2020-12-02 DIAGNOSIS — E876 Hypokalemia: Secondary | ICD-10-CM | POA: Diagnosis not present

## 2020-12-02 DIAGNOSIS — Z853 Personal history of malignant neoplasm of breast: Secondary | ICD-10-CM | POA: Diagnosis not present

## 2020-12-02 DIAGNOSIS — Z79899 Other long term (current) drug therapy: Secondary | ICD-10-CM | POA: Insufficient documentation

## 2020-12-02 LAB — CBC WITH DIFFERENTIAL/PLATELET
Abs Immature Granulocytes: 0.01 10*3/uL (ref 0.00–0.07)
Basophils Absolute: 0 10*3/uL (ref 0.0–0.1)
Basophils Relative: 0 %
Eosinophils Absolute: 0 10*3/uL (ref 0.0–0.5)
Eosinophils Relative: 1 %
HCT: 38.9 % (ref 36.0–46.0)
Hemoglobin: 13.4 g/dL (ref 12.0–15.0)
Immature Granulocytes: 1 %
Lymphocytes Relative: 30 %
Lymphs Abs: 0.5 10*3/uL — ABNORMAL LOW (ref 0.7–4.0)
MCH: 28.1 pg (ref 26.0–34.0)
MCHC: 34.4 g/dL (ref 30.0–36.0)
MCV: 81.6 fL (ref 80.0–100.0)
Monocytes Absolute: 0.2 10*3/uL (ref 0.1–1.0)
Monocytes Relative: 12 %
Neutro Abs: 0.9 10*3/uL — ABNORMAL LOW (ref 1.7–7.7)
Neutrophils Relative %: 56 %
Platelets: 94 10*3/uL — ABNORMAL LOW (ref 150–400)
RBC: 4.77 MIL/uL (ref 3.87–5.11)
RDW: 14.7 % (ref 11.5–15.5)
WBC: 1.6 10*3/uL — ABNORMAL LOW (ref 4.0–10.5)
nRBC: 0 % (ref 0.0–0.2)

## 2020-12-02 LAB — MAGNESIUM: Magnesium: 1.3 mg/dL — ABNORMAL LOW (ref 1.7–2.4)

## 2020-12-02 LAB — COMPREHENSIVE METABOLIC PANEL
ALT: 21 U/L (ref 0–44)
AST: 13 U/L — ABNORMAL LOW (ref 15–41)
Albumin: 3.8 g/dL (ref 3.5–5.0)
Alkaline Phosphatase: 115 U/L (ref 38–126)
Anion gap: 10 (ref 5–15)
BUN: 15 mg/dL (ref 8–23)
CO2: 26 mmol/L (ref 22–32)
Calcium: 8.3 mg/dL — ABNORMAL LOW (ref 8.9–10.3)
Chloride: 104 mmol/L (ref 98–111)
Creatinine, Ser: 0.89 mg/dL (ref 0.44–1.00)
GFR, Estimated: >60 mL/min (ref >60)
Glucose, Bld: 108 mg/dL — ABNORMAL HIGH (ref 70–99)
Potassium: 2.8 mmol/L — ABNORMAL LOW (ref 3.5–5.1)
Sodium: 140 mmol/L (ref 135–145)
Total Bilirubin: 0.9 mg/dL (ref 0.3–1.2)
Total Protein: 6.6 g/dL (ref 6.5–8.1)

## 2020-12-02 LAB — SAMPLE TO BLOOD BANK

## 2020-12-02 MED ORDER — VENETOCLAX 100 MG TABLET
ORAL_TABLET | 2 refills | 0 days | Status: CP
Start: 2020-12-02 — End: ?
  Filled 2020-12-11: qty 56, 28d supply, fill #0

## 2020-12-11 ENCOUNTER — Inpatient Hospital Stay (HOSPITAL_BASED_OUTPATIENT_CLINIC_OR_DEPARTMENT_OTHER): Payer: PPO | Admitting: Hospice and Palliative Medicine

## 2020-12-11 DIAGNOSIS — C9200 Acute myeloblastic leukemia, not having achieved remission: Secondary | ICD-10-CM | POA: Diagnosis not present

## 2020-12-11 NOTE — Progress Notes (Signed)
Virtual Visit via Telephone Note  I connected with Debra Hale on 12/11/20 at  2:00 PM EDT by telephone and verified that I am speaking with the correct person using two identifiers.  Location: Patient: home Provider: clinic   I discussed the limitations, risks, security and privacy concerns of performing an evaluation and management service by telephone and the availability of in person appointments. I also discussed with the patient that there may be a patient responsible charge related to this service. The patient expressed understanding and agreed to proceed.   History of Present Illness: Debra Hale is a 70 y.o. 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  Patient was previously on venetoclax but developed perirectal abscess and so this was discontinued.  She has been symptomatic with progressive fatigue and poor oral intake.  Palliative care was requested to help coordinate care and manage ongoing symptoms.    Observations/Objective:  Called spoke with patient by phone.  She reports that she is doing reasonably well.  She denies any significant symptomatic complaints at present.  She does continue to endorse chronic diarrhea but this is unchanged in characteristic or frequency.  Patient takes as needed Lomotil which seems to help.  Patient asked if she needed to continue prophylactic Valtrex.  Discussed with Dr. [name] and will discontinue.  Assessment and Plan: AML -on current treatment with Vidaza.  Followed by Dr. [name].  Symptomatically, appears stable.  DC Valtrex  Follow Up Instructions: Follow-up MyChart visit in 2-3 months   I discussed the assessment and treatment plan with the patient. The patient was provided an opportunity to ask questions and all were answered. The patient agreed with the plan and demonstrated an understanding of the instructions.   The patient was advised to call back or seek an in-person evaluation  if the symptoms worsen or if the condition fails to improve as anticipated.  I provided 10 minutes of non-face-to-face time during this encounter.   Irean Hong, NP

## 2020-12-12 ENCOUNTER — Encounter: Payer: PPO | Admitting: Internal Medicine

## 2020-12-14 NOTE — Progress Notes (Signed)
[facility]  [phone] Fax:(336) [phone]  ID: AARIONA MOMON OB: 1950-08-27  MR#: [id number]  ZHG#:[id number]  Patient Care Team: [name], MD as PCP - General (Internal Medicine) Wellington Hampshire, MD as PCP - Cardiology (Cardiology)  CHIEF COMPLAINT: AML.  INTERVAL HISTORY: Patient returns to clinic today for further evaluation and continuation of cycle 8 of single agent Vidaza.  Her diarrhea resolved, but patient states on Sunday is returned with approximately 3-5 bowel movements per day.  She continues to have chronic weakness and fatigue, but otherwise feels well.  She is otherwise tolerating her treatments without significant side effects. She has no neurologic complaints.  She denies any recent fevers or illnesses. She has no chest pain, shortness of breath, cough, or hemoptysis.  She denies any nausea, vomiting, or constipation.  She has no urinary complaints.  Patient offers no further specific complaints today.    REVIEW OF SYSTEMS:   Review of Systems  Constitutional:  Positive for malaise/fatigue. Negative for fever and weight loss.  Respiratory: Negative.  Negative for cough, hemoptysis and shortness of breath.   Cardiovascular: Negative.  Negative for chest pain and leg swelling.  Gastrointestinal:  Positive for diarrhea. Negative for abdominal pain.  Genitourinary: Negative.  Negative for dysuria.  Musculoskeletal: Negative.  Negative for back pain.  Skin: Negative.  Negative for rash.  Neurological:  Positive for weakness. Negative for dizziness, focal weakness and headaches.  Psychiatric/Behavioral:  Positive for memory loss. The patient is not nervous/anxious.    As per HPI. Otherwise, a complete review of systems is negative.  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[facility]) 2002   s/p surgery and XRT   Osteoporosis    Other and unspecified hyperlipidemia    Palpitations    Personal history of chemotherapy    Personal history of malignant neoplasm of large intestine    carcinoma - cecum, s/p right laparoscopic colectomy - s/p chemotherapy and XRT   Personal history of radiation therapy    Pneumonia 2019   PONV (postoperative nausea and vomiting)    Pure hypercholesterolemia    Unspecified hereditary and idiopathic peripheral neuropathy     PAST SURGICAL HISTORY: Past Surgical History:  Procedure Laterality Date   APPENDECTOMY  1985   BREAST BIOPSY Right 2009   positive   BREAST BIOPSY Right 2009   negative   BREAST LUMPECTOMY Right 2009   breast cancer   CHOLECYSTECTOMY  1995   COLONOSCOPY     COLONOSCOPY WITH PROPOFOL N/A 10/29/2017   Procedure: COLONOSCOPY WITH PROPOFOL;  Surgeon: Manya Silvas, MD;  Location: [facility] ENDOSCOPY;  Service: Endoscopy;  Laterality: N/A;   DILATION AND CURETTAGE OF UTERUS  1990   DILATION AND CURETTAGE, DIAGNOSTIC / THERAPEUTIC  1990   ESOPHAGOGASTRODUODENOSCOPY     ESOPHAGOGASTRODUODENOSCOPY (EGD) WITH PROPOFOL N/A 10/29/2017   Procedure: ESOPHAGOGASTRODUODENOSCOPY (EGD) WITH PROPOFOL;  Surgeon: Manya Silvas, MD;  Location: [facility] ENDOSCOPY;  Service: Endoscopy;  Laterality: N/A;   INCISION AND DRAINAGE PERIRECTAL ABSCESS N/A 06/17/2020   Procedure: IRRIGATION AND DEBRIDEMENT PERIRECTAL ABSCESS;  Surgeon: Jules Husbands, MD;  Location: [facility] ORS;  Service: General;  Laterality: N/A;   LAPAROSCOPIC PARTIAL COLECTOMY     stage 3-C carcinoma of the cecum, s/p chemotherapy and xrt   LITHOTRIPSY     SIGMOIDOSCOPY  08/26/1993    THYROID LOBECTOMY  2002   s/p XRT   TOTAL HIP ARTHROPLASTY Right 10/24/2019   Procedure: TOTAL HIP ARTHROPLASTY;  Surgeon: Corky Mull, MD;  Location: [facility] ORS;  Service: Orthopedics;  Laterality: Right;    FAMILY HISTORY: Family History  Problem Relation Age of Onset   Stroke Mother        5s   Alzheimer's disease Mother    Cancer Mother    Lung cancer Father    Prostate cancer Father    Cancer Father        Colon   Colon cancer Father    Breast cancer Sister        13's   Lung cancer Sister    Breast cancer Maternal Aunt     ADVANCED DIRECTIVES (Y/N):  N  HEALTH MAINTENANCE: Social History   Tobacco Use   Smoking status: Never   Smokeless tobacco: Never  Vaping Use   Vaping Use: Never used  Substance Use Topics   Alcohol use: No    Alcohol/week: 0.0 standard drinks   Drug use: No     Colonoscopy:  PAP:  Bone density:  Lipid panel:  Allergies  Allergen Reactions   Demeclocycline Other (See Comments)    Throat swells   Sulfa Antibiotics Other (See Comments)    Other reaction(s): Other (See Comments) Throat swells Other reaction(s): Other (See Comments) Throat swells   Tetracyclines & Related Other (See Comments)    Throat swells    Augmentin [Amoxicillin-Pot Clavulanate] Diarrhea   Bentyl [Dicyclomine Hcl]     unkn   Ciprofloxacin Diarrhea   Codeine Other (See Comments)    dizziness     Dicyclomine Hcl Other (See Comments)    unkn   Epinephrine     Speeds heart up - panic    Flagyl [Metronidazole] Nausea And Vomiting   Librax [Chlordiazepoxide-Clidinium]     unkn   Novocain [Procaine] Other (See Comments)    "Shaky"   Phenobarbital     unkn   Prednisone     Nervous     Ultram [Tramadol] Other (See Comments)    Sick feeling    Current Outpatient Medications  Medication Sig Dispense Refill   Azelastine-Fluticasone 137-50 MCG/ACT SUSP Place 2 sprays into both nostrils daily.     clonazePAM (KLONOPIN) 0.5 MG tablet Take 1.5 mg by  mouth 2 (two) times daily.   0   diphenoxylate-atropine (LOMOTIL) 2.5-0.025 MG tablet Take 1 tablet by mouth 4 (four) times daily as needed for diarrhea or loose stools. 120 tablet 1   escitalopram (LEXAPRO) 20 MG tablet Take 20 mg by mouth every morning.      levothyroxine (SYNTHROID) 125 MCG tablet Take 125 mcg by mouth daily before breakfast.     loperamide (IMODIUM) 2 MG capsule Take by mouth.     metoprolol tartrate (LOPRESSOR) 25 MG tablet TAKE 1 TABLET (25 MG TOTAL) BY MOUTH AS NEEDED (AS NEEDED UP TO TWICE DAILY FOR PALPITATIONS). 60 tablet 1   pantoprazole (PROTONIX) 40 MG tablet TAKE 1 TABLET BY MOUTH EVERY  DAY 90 tablet 2   valACYclovir (VALTREX) 500 MG tablet Take 1 tablet by mouth daily.     venetoclax (VENCLEXTA) 100 MG tablet Take 400 mg by mouth daily. Take for 14 days, then hold for 14 days. Repeat every 28 days. Take with a meal and water.     levothyroxine (SYNTHROID) 112 MCG tablet Take 112 mcg by mouth daily. (Patient not taking: Reported on 12/17/2020)     magnesium oxide (MAG-OX) 400 MG tablet Take 1 tablet (400 mg total) by mouth daily. 30 tablet 0   potassium chloride SA (KLOR-CON) 20 MEQ tablet Take 1 tablet (20 mEq total) by mouth 2 (two) times daily. 60 tablet 0   Current Facility-Administered Medications  Medication Dose Route Frequency Provider Last Rate Last Admin   ondansetron (ZOFRAN) tablet 8 mg  8 mg Oral Once Guse, Jacquelynn Cree, FNP       Facility-Administered Medications Ordered in Other Visits  Medication Dose Route Frequency Provider Last Rate Last Admin   potassium chloride (KLOR-CON) CR tablet 40 mEq  40 mEq Oral BID Lloyd Huger, MD   40 mEq at 07/08/20 1136    OBJECTIVE: Vitals:   12/17/20 1401  BP: 106/66  Pulse: 79  Resp: 18  Temp: 98 F (36.7 C)  SpO2: 98%     Body mass index is 20.96 kg/m.    ECOG FS:1 - Symptomatic but completely ambulatory  General: Well-developed, well-nourished, no acute distress. Eyes: Pink conjunctiva,  anicteric sclera. HEENT: Normocephalic, moist mucous membranes. Lungs: No audible wheezing or coughing. Heart: Regular rate and rhythm. Abdomen: Soft, nontender, no obvious distention. Musculoskeletal: No edema, cyanosis, or clubbing. Neuro: Alert, answering all questions appropriately. Cranial nerves grossly intact. Skin: No rashes or petechiae noted. Psych: Normal affect.   LAB RESULTS:  Lab Results  Component Value Date   NA 141 12/16/2020   K 3.2 (L) 12/16/2020   CL 107 12/16/2020   CO2 26 12/16/2020   GLUCOSE 99 12/16/2020   BUN 17 12/16/2020   CREATININE 0.92 12/16/2020   CALCIUM 8.9 12/16/2020   PROT 6.6 12/16/2020   ALBUMIN 3.8 12/16/2020   AST 16 12/16/2020   ALT 26 12/16/2020   ALKPHOS 115 12/16/2020   BILITOT 0.9 12/16/2020   GFRNONAA >60 12/16/2020   GFRAA 54 (L) 10/26/2019    Lab Results  Component Value Date   WBC 1.5 (L) 12/16/2020   NEUTROABS 0.8 (L) 12/16/2020   HGB 13.2 12/16/2020   HCT 38.8 12/16/2020   MCV 82.7 12/16/2020   PLT 203 12/16/2020     STUDIES: No results found.   ASSESSMENT: AML.  PLAN:    1.  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  Her peripheral blood flow cytometry also revealed 6% aberrant myeloblasts.  Patient completed cycle 1 of induction therapy at [facility] with Vidaza and venetoclax.  She only received 5 days of Vidaza for cycle 2 recently which was interrupted secondary to significant diarrhea and a perirectal abscess. Repeat bone marrow biopsy on July 16, 2020 did not reveal any residual disease, but cytogenetics remained positive likely indicating a very good partial remission.  Given patient's difficulty with treatment and significant diarrhea venetoclax was discontinued altogether and will proceed with dose reduced Vidaza on days 1 through 5 on a 28-day cycle.  Proceed with cycle 8, day 2 of treatment today.  Return to clinic daily for the remainder of the week for completion of cycle 8.  Patient will have a repeat bone  marrow biopsy  in approximately 3 weeks and then follow-up in clinic 1 week later to discuss whether or not to proceed with cycle 9 of treatment.   2.  Neutropenia: Chronic and unchanged.  Discontinue venetoclax and dose reduce Vidaza as above. 3.  Thrombocytopenia: Resolved. 4.  Perirectal abscess: Resolved.   5.  Diarrhea: Intermittent.  Continue Lomotil as needed. 6.  Supportive care: Patient has been given referrals to home health and home palliative care.  Appreciate palliative care input.   7. 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  Patient also received chemotherapy and likely received Adriamycin, but exact regimen is unknown.  She completed 5 years of hormonal therapy in approximately June 2015. Currently, she has no evidence of disease.  Her most recent mammogram on March 05, 2020 was reported as BI-RADS 1.  Repeat in November 2022. 8.  History of colon cancer: Patient also states that she received chemotherapy for this, possibly FOLFOX but again this is unknown. 9.  Hypokalemia: Continue oral potassium supplementation as prescribed. 10.  Hypomagnesia: Continue oral magnesium supplementation as prescribed.  Patient expressed understanding and was in agreement with this plan. She also understands that She can call clinic at any time with any questions, concerns, or complaints.   Cancer Staging History of breast cancer Staging form: Breast, AJCC 7th Edition - Clinical stage from 01/07/2016: Stage IA (T1c, N0, M0) - Signed by Lloyd Huger, MD on 01/07/2016 Laterality: Right Estrogen receptor status: Positive Progesterone receptor status: Positive HER2 status: Negative   Lloyd Huger, MD   12/18/2020 6:59 AM

## 2020-12-16 ENCOUNTER — Inpatient Hospital Stay: Payer: PPO

## 2020-12-16 ENCOUNTER — Other Ambulatory Visit: Payer: Self-pay

## 2020-12-16 VITALS — BP 150/77 | HR 82 | Temp 96.5°F | Resp 18

## 2020-12-16 DIAGNOSIS — C9200 Acute myeloblastic leukemia, not having achieved remission: Secondary | ICD-10-CM

## 2020-12-16 DIAGNOSIS — Z5111 Encounter for antineoplastic chemotherapy: Secondary | ICD-10-CM | POA: Diagnosis not present

## 2020-12-16 LAB — CBC WITH DIFFERENTIAL/PLATELET
Abs Immature Granulocytes: 0 10*3/uL (ref 0.00–0.07)
Basophils Absolute: 0 10*3/uL (ref 0.0–0.1)
Basophils Relative: 1 %
Eosinophils Absolute: 0 10*3/uL (ref 0.0–0.5)
Eosinophils Relative: 1 %
HCT: 38.8 % (ref 36.0–46.0)
Hemoglobin: 13.2 g/dL (ref 12.0–15.0)
Immature Granulocytes: 0 %
Lymphocytes Relative: 40 %
Lymphs Abs: 0.6 10*3/uL — ABNORMAL LOW (ref 0.7–4.0)
MCH: 28.1 pg (ref 26.0–34.0)
MCHC: 34 g/dL (ref 30.0–36.0)
MCV: 82.7 fL (ref 80.0–100.0)
Monocytes Absolute: 0.1 10*3/uL (ref 0.1–1.0)
Monocytes Relative: 8 %
Neutro Abs: 0.8 10*3/uL — ABNORMAL LOW (ref 1.7–7.7)
Neutrophils Relative %: 50 %
Platelets: 203 10*3/uL (ref 150–400)
RBC: 4.69 MIL/uL (ref 3.87–5.11)
RDW: 15.6 % — ABNORMAL HIGH (ref 11.5–15.5)
WBC: 1.5 10*3/uL — ABNORMAL LOW (ref 4.0–10.5)
nRBC: 0 % (ref 0.0–0.2)

## 2020-12-16 LAB — COMPREHENSIVE METABOLIC PANEL
ALT: 26 U/L (ref 0–44)
AST: 16 U/L (ref 15–41)
Albumin: 3.8 g/dL (ref 3.5–5.0)
Alkaline Phosphatase: 115 U/L (ref 38–126)
Anion gap: 8 (ref 5–15)
BUN: 17 mg/dL (ref 8–23)
CO2: 26 mmol/L (ref 22–32)
Calcium: 8.9 mg/dL (ref 8.9–10.3)
Chloride: 107 mmol/L (ref 98–111)
Creatinine, Ser: 0.92 mg/dL (ref 0.44–1.00)
GFR, Estimated: >60 mL/min (ref >60)
Glucose, Bld: 99 mg/dL (ref 70–99)
Potassium: 3.2 mmol/L — ABNORMAL LOW (ref 3.5–5.1)
Sodium: 141 mmol/L (ref 135–145)
Total Bilirubin: 0.9 mg/dL (ref 0.3–1.2)
Total Protein: 6.6 g/dL (ref 6.5–8.1)

## 2020-12-16 LAB — SAMPLE TO BLOOD BANK

## 2020-12-16 MED ORDER — ONDANSETRON HCL 4 MG PO TABS
8.0000 mg | ORAL_TABLET | Freq: Once | ORAL | Status: AC
  Administered 2020-12-16: 8 mg via ORAL
  Filled 2020-12-16: qty 2

## 2020-12-16 MED ORDER — AZACITIDINE CHEMO SQ INJECTION
50.0000 mg/m2 | Freq: Once | INTRAMUSCULAR | Status: AC
  Administered 2020-12-16: 87.5 mg via SUBCUTANEOUS
  Filled 2020-12-16: qty 3.5

## 2020-12-16 NOTE — Patient Instructions (Signed)
[facility]  Discharge Instructions: Thank you for choosing [facility] to provide your oncology and hematology care.  If you have a lab appointment with the [facility], please go directly to the [facility] and check in at the registration area.  Wear comfortable clothing and clothing appropriate for easy access to any Portacath or PICC line.   We strive to give you quality time with your provider. You may need to reschedule your appointment if you arrive late (15 or more minutes).  Arriving late affects you and other patients whose appointments are after yours.  Also, if you miss three or more appointments without notifying the office, you may be dismissed from the clinic at the provider's discretion.      For prescription refill requests, have your pharmacy contact our office and allow 72 hours for refills to be completed.    Today you received the following chemotherapy and/or immunotherapy agents VIDAZA       To help prevent nausea and vomiting after your treatment, we encourage you to take your nausea medication as directed.  BELOW ARE SYMPTOMS THAT SHOULD BE REPORTED IMMEDIATELY: *FEVER GREATER THAN 100.4 F (38 C) OR HIGHER *CHILLS OR SWEATING *NAUSEA AND VOMITING THAT IS NOT CONTROLLED WITH YOUR NAUSEA MEDICATION *UNUSUAL SHORTNESS OF BREATH *UNUSUAL BRUISING OR BLEEDING *URINARY PROBLEMS (pain or burning when urinating, or frequent urination) *BOWEL PROBLEMS (unusual diarrhea, constipation, pain near the anus) TENDERNESS IN MOUTH AND THROAT WITH OR WITHOUT PRESENCE OF ULCERS (sore throat, sores in mouth, or a toothache) UNUSUAL RASH, SWELLING OR PAIN  UNUSUAL VAGINAL DISCHARGE OR ITCHING   Items with * indicate a potential emergency and should be followed up as soon as possible or go to the Emergency Department if any problems should occur.  Please show the CHEMOTHERAPY ALERT CARD or IMMUNOTHERAPY ALERT CARD at check-in to  the Emergency Department and triage nurse.  Should you have questions after your visit or need to cancel or reschedule your appointment, please contact [facility]  [phone] and follow the prompts.  Office hours are 8:00 a.m. to 4:30 p.m. Monday - Friday. Please note that voicemails left after 4:00 p.m. may not be returned until the following business day.  We are closed weekends and major holidays. You have access to a nurse at all times for urgent questions. Please call the main number to the clinic [phone] and follow the prompts.  For any non-urgent questions, you may also contact your provider using MyChart. We now offer e-Visits for anyone 67 and older to request care online for non-urgent symptoms. For details visit [email or web address].   Also download the MyChart app! Go to the app store, search "MyChart", open the app, select [facility], and log in with your MyChart username and password.  Due to Covid, a mask is required upon entering the hospital/clinic. If you do not have a mask, one will be given to you upon arrival. For doctor visits, patients may have 1 support person aged 40 or older with them. For treatment visits, patients cannot have anyone with them due to current Covid guidelines and our immunocompromised population.   Azacitidine suspension for injection (subcutaneous use) What is this medication? AZACITIDINE (ay [place]) is a chemotherapy drug. This medicine reduces the growth of cancer cells and can suppress the immune system. It is used fortreating myelodysplastic syndrome or some types of leukemia. This medicine may be used for other purposes; ask  your health care provider orpharmacist if you have questions. COMMON BRAND NAME(S): Vidaza What should I tell my care team before I take this medication? They need to know if you have any of these conditions: kidney disease liver disease liver tumors an unusual or allergic  reaction to azacitidine, mannitol, other medicines, foods, dyes, or preservatives pregnant or trying to get pregnant breast-feeding How should I use this medication? This medicine is for injection under the skin. It is administered in a hospitalor clinic by a specially trained health care professional. Talk to your pediatrician regarding the use of this medicine in children. Whilethis drug may be prescribed for selected conditions, precautions do apply. Overdosage: If you think you have taken too much of this medicine contact apoison control center or emergency room at once. NOTE: This medicine is only for you. Do not share this medicine with others. What if I miss a dose? It is important not to miss your dose. Call your doctor or health careprofessional if you are unable to keep an appointment. What may interact with this medication? Interactions have not been studied. Give your health care provider a list of all the medicines, herbs, non-prescription drugs, or dietary supplements you use. Also tell them if you smoke, drink alcohol, or use illegal drugs. Some items may interact with yourmedicine. This list may not describe all possible interactions. Give your health care provider a list of all the medicines, herbs, non-prescription drugs, or dietary supplements you use. Also tell them if you smoke, drink alcohol, or use illegaldrugs. Some items may interact with your medicine. What should I watch for while using this medication? Visit your doctor for checks on your progress. This drug may make you feel generally unwell. This is not uncommon, as chemotherapy can affect healthy cells as well as cancer cells. Report any side effects. Continue your course oftreatment even though you feel ill unless your doctor tells you to stop. In some cases, you may be given additional medicines to help with side effects.Follow all directions for their use. Call your doctor or health care professional for advice if  you get a fever, chills or sore throat, or other symptoms of a cold or flu. Do not treat yourself. This drug decreases your body's ability to fight infections. Try toavoid being around people who are sick. This medicine may increase your risk to bruise or bleed. Call your doctor orhealth care professional if you notice any unusual bleeding. You may need blood work done while you are taking this medicine. Do not become pregnant while taking this medicine and for 6 months after the last dose. Women should inform their doctor if they wish to become pregnant or think they might be pregnant. Men should not father a child while taking this medicine and for 3 months after the last dose. There is a potential for serious side effects to an unborn child. Talk to your health care professional or pharmacist for more information. Do not breast-feed an infant while taking thismedicine and for 1 week after the last dose. This medicine may interfere with the ability to have a child. Talk with yourdoctor or health care professional if you are concerned about your fertility. What side effects may I notice from receiving this medication? Side effects that you should report to your doctor or health care professionalas soon as possible: allergic reactions like skin rash, itching or hives, swelling of the face, lips, or tongue low blood counts - this medicine may decrease the number of white  blood cells, red blood cells and platelets. You may be at increased risk for infections and bleeding. signs of infection - fever or chills, cough, sore throat, pain passing urine signs of decreased platelets or bleeding - bruising, pinpoint red spots on the skin, black, tarry stools, blood in the urine signs of decreased red blood cells - unusually weak or tired, fainting spells, lightheadedness signs and symptoms of kidney injury like trouble passing urine or change in the amount of urine signs and symptoms of liver injury like dark  yellow or brown urine; general ill feeling or flu-like symptoms; light-colored stools; loss of appetite; nausea; right upper belly pain; unusually weak or tired; yellowing of the eyes or skin Side effects that usually do not require medical attention (report to yourdoctor or health care professional if they continue or are bothersome): constipation diarrhea nausea, vomiting pain or redness at the injection site unusually weak or tired This list may not describe all possible side effects. Call your doctor for medical advice about side effects. You may report side effects to FDA at1-800-FDA-1088. Where should I keep my medication? This drug is given in a hospital or clinic and will not be stored at home. NOTE: This sheet is a summary. It may not cover all possible information. If you have questions about this medicine, talk to your doctor, pharmacist, orhealth care provider.  2022 Elsevier/Gold Standard (2016-05-19 14:37:51)

## 2020-12-17 ENCOUNTER — Inpatient Hospital Stay: Payer: PPO

## 2020-12-17 ENCOUNTER — Inpatient Hospital Stay (HOSPITAL_BASED_OUTPATIENT_CLINIC_OR_DEPARTMENT_OTHER): Payer: PPO | Admitting: Oncology

## 2020-12-17 VITALS — BP 106/66 | HR 79 | Temp 98.0°F | Resp 18 | Wt 124.0 lb

## 2020-12-17 DIAGNOSIS — C9200 Acute myeloblastic leukemia, not having achieved remission: Secondary | ICD-10-CM

## 2020-12-17 DIAGNOSIS — Z5111 Encounter for antineoplastic chemotherapy: Secondary | ICD-10-CM | POA: Diagnosis not present

## 2020-12-17 MED ORDER — AZACITIDINE CHEMO SQ INJECTION
50.0000 mg/m2 | Freq: Once | INTRAMUSCULAR | Status: AC
  Administered 2020-12-17: 87.5 mg via SUBCUTANEOUS
  Filled 2020-12-17: qty 3.5

## 2020-12-17 MED ORDER — ONDANSETRON HCL 4 MG PO TABS
8.0000 mg | ORAL_TABLET | Freq: Once | ORAL | Status: AC
  Administered 2020-12-17: 8 mg via ORAL
  Filled 2020-12-17: qty 2

## 2020-12-17 MED ORDER — POTASSIUM CHLORIDE CRYS ER 20 MEQ PO TBCR: 20.0000 meq | EXTENDED_RELEASE_TABLET | Freq: Two times a day (BID) | ORAL | 0 refills | Status: DC

## 2020-12-17 MED ORDER — MAGNESIUM OXIDE 400 MG PO TABS: 400.0000 mg | ORAL_TABLET | Freq: Every day | ORAL | 0 refills | Status: DC

## 2020-12-17 NOTE — Progress Notes (Signed)
Patient reports diarrhea, she took imodium. She also reports some nausea and sinus pain and decrease in appetite. She would like to know if she is to be taking potassium and magnesium as she did not get these in her pill pack.

## 2020-12-17 NOTE — Patient Instructions (Signed)
[name]  Discharge Instructions: Thank you for choosing [facility] to provide your oncology and hematology care.  If you have a lab appointment with the [facility], please go directly to the [facility] and check in at the registration area.  Wear comfortable clothing and clothing appropriate for easy access to any Portacath or PICC line.   We strive to give you quality time with your provider. You may need to reschedule your appointment if you arrive late (15 or more minutes).  Arriving late affects you and other patients whose appointments are after yours.  Also, if you miss three or more appointments without notifying the office, you may be dismissed from the clinic at the provider's discretion.      For prescription refill requests, have your pharmacy contact our office and allow 72 hours for refills to be completed.    Today you received the following chemotherapy and/or immunotherapy agents VIDAZA      To help prevent nausea and vomiting after your treatment, we encourage you to take your nausea medication as directed.  BELOW ARE SYMPTOMS THAT SHOULD BE REPORTED IMMEDIATELY: *FEVER GREATER THAN 100.4 F (38 C) OR HIGHER *CHILLS OR SWEATING *NAUSEA AND VOMITING THAT IS NOT CONTROLLED WITH YOUR NAUSEA MEDICATION *UNUSUAL SHORTNESS OF BREATH *UNUSUAL BRUISING OR BLEEDING *URINARY PROBLEMS (pain or burning when urinating, or frequent urination) *BOWEL PROBLEMS (unusual diarrhea, constipation, pain near the anus) TENDERNESS IN MOUTH AND THROAT WITH OR WITHOUT PRESENCE OF ULCERS (sore throat, sores in mouth, or a toothache) UNUSUAL RASH, SWELLING OR PAIN  UNUSUAL VAGINAL DISCHARGE OR ITCHING   Items with * indicate a potential emergency and should be followed up as soon as possible or go to the Emergency Department if any problems should occur.  Please show the CHEMOTHERAzacitidine suspension for injection (subcutaneous  use) What is this medication? AZACITIDINE (ay [place]) is a chemotherapy drug. This medicine reduces the growth of cancer cells and can suppress the immune system. It is used fortreating myelodysplastic syndrome or some types of leukemia. This medicine may be used for other purposes; ask your health care provider orpharmacist if you have questions. COMMON BRAND NAME(S): Vidaza What should I tell my care team before I take this medication? They need to know if you have any of these conditions: kidney disease liver disease liver tumors an unusual or allergic reaction to azacitidine, mannitol, other medicines, foods, dyes, or preservatives pregnant or trying to get pregnant breast-feeding How should I use this medication? This medicine is for injection under the skin. It is administered in a hospitalor clinic by a specially trained health care professional. Talk to your pediatrician regarding the use of this medicine in children. Whilethis drug may be prescribed for selected conditions, precautions do apply. Overdosage: If you think you have taken too much of this medicine contact apoison control center or emergency room at once. NOTE: This medicine is only for you. Do not share this medicine with others. What if I miss a dose? It is important not to miss your dose. Call your doctor or health careprofessional if you are unable to keep an appointment. What may interact with this medication? Interactions have not been studied. Give your health care provider a list of all the medicines, herbs, non-prescription drugs, or dietary supplements you use. Also tell them if you smoke, drink alcohol, or use illegal drugs. Some items may interact with yourmedicine. This list may not describe all possible interactions.  Give your health care provider a list of all the medicines, herbs, non-prescription drugs, or dietary supplements you use. Also tell them if you smoke, drink alcohol, or use illegaldrugs.  Some items may interact with your medicine. What should I watch for while using this medication? Visit your doctor for checks on your progress. This drug may make you feel generally unwell. This is not uncommon, as chemotherapy can affect healthy cells as well as cancer cells. Report any side effects. Continue your course oftreatment even though you feel ill unless your doctor tells you to stop. In some cases, you may be given additional medicines to help with side effects.Follow all directions for their use. Call your doctor or health care professional for advice if you get a fever, chills or sore throat, or other symptoms of a cold or flu. Do not treat yourself. This drug decreases your body's ability to fight infections. Try toavoid being around people who are sick. This medicine may increase your risk to bruise or bleed. Call your doctor orhealth care professional if you notice any unusual bleeding. You may need blood work done while you are taking this medicine. Do not become pregnant while taking this medicine and for 6 months after the last dose. Women should inform their doctor if they wish to become pregnant or think they might be pregnant. Men should not father a child while taking this medicine and for 3 months after the last dose. There is a potential for serious side effects to an unborn child. Talk to your health care professional or pharmacist for more information. Do not breast-feed an infant while taking thismedicine and for 1 week after the last dose. This medicine may interfere with the ability to have a child. Talk with yourdoctor or health care professional if you are concerned about your fertility. What side effects may I notice from receiving this medication? Side effects that you should report to your doctor or health care professionalas soon as possible: allergic reactions like skin rash, itching or hives, swelling of the face, lips, or tongue low blood counts - this medicine may  decrease the number of white blood cells, red blood cells and platelets. You may be at increased risk for infections and bleeding. signs of infection - fever or chills, cough, sore throat, pain passing urine signs of decreased platelets or bleeding - bruising, pinpoint red spots on the skin, black, tarry stools, blood in the urine signs of decreased red blood cells - unusually weak or tired, fainting spells, lightheadedness signs and symptoms of kidney injury like trouble passing urine or change in the amount of urine signs and symptoms of liver injury like dark yellow or brown urine; general ill feeling or flu-like symptoms; light-colored stools; loss of appetite; nausea; right upper belly pain; unusually weak or tired; yellowing of the eyes or skin Side effects that usually do not require medical attention (report to yourdoctor or health care professional if they continue or are bothersome): constipation diarrhea nausea, vomiting pain or redness at the injection site unusually weak or tired This list may not describe all possible side effects. Call your doctor for medical advice about side effects. You may report side effects to FDA at1-800-FDA-1088. Where should I keep my medication? This drug is given in a hospital or clinic and will not be stored at home. NOTE: This sheet is a summary. It may not cover all possible information. If you have questions about this medicine, talk to your doctor, pharmacist, orhealth care provider.  2022 Elsevier/Gold Standard (2016-05-19 14:37:51) APY ALERT CARD or IMMUNOTHERAPY ALERT CARD at check-in to the Emergency Department and triage nurse.  Should you have questions after your visit or need to cancel or reschedule your appointment, please contact [facility]  [phone] and follow the prompts.  Office hours are 8:00 a.m. to 4:30 p.m. Monday - Friday. Please note that voicemails left after 4:00 p.m. may not be returned  until the following business day.  We are closed weekends and major holidays. You have access to a nurse at all times for urgent questions. Please call the main number to the clinic [phone] and follow the prompts.  For any non-urgent questions, you may also contact your provider using MyChart. We now offer e-Visits for anyone 41 and older to request care online for non-urgent symptoms. For details visit [email or web address].   Also download the MyChart app! Go to the app store, search "MyChart", open the app, select [facility], and log in with your MyChart username and password.  Due to Covid, a mask is required upon entering the hospital/clinic. If you do not have a mask, one will be given to you upon arrival. For doctor visits, patients may have 1 support person aged 2 or older with them. For treatment visits, patients cannot have anyone with them due to current Covid guidelines and our immunocompromised population.

## 2020-12-18 ENCOUNTER — Inpatient Hospital Stay: Payer: PPO

## 2020-12-18 ENCOUNTER — Telehealth: Payer: Self-pay

## 2020-12-18 ENCOUNTER — Encounter: Payer: Self-pay | Admitting: Oncology

## 2020-12-18 VITALS — BP 127/66 | HR 80 | Temp 96.6°F | Resp 18

## 2020-12-18 DIAGNOSIS — C9200 Acute myeloblastic leukemia, not having achieved remission: Secondary | ICD-10-CM

## 2020-12-18 DIAGNOSIS — Z5111 Encounter for antineoplastic chemotherapy: Secondary | ICD-10-CM | POA: Diagnosis not present

## 2020-12-18 MED ORDER — AZACITIDINE CHEMO SQ INJECTION
50.0000 mg/m2 | Freq: Once | INTRAMUSCULAR | Status: AC
  Administered 2020-12-18: 87.5 mg via SUBCUTANEOUS
  Filled 2020-12-18: qty 3.5

## 2020-12-18 MED ORDER — ONDANSETRON HCL 4 MG PO TABS
8.0000 mg | ORAL_TABLET | Freq: Once | ORAL | Status: AC
  Administered 2020-12-18: 8 mg via ORAL
  Filled 2020-12-18: qty 2

## 2020-12-18 NOTE — Telephone Encounter (Signed)
Scheduling request for bone marrow biopsy to be done on 01/02/21 or 01/03/21 faxed to specialty scheduling on 12/17/20.

## 2020-12-18 NOTE — Patient Instructions (Signed)
[facility]   Discharge Instructions: Thank you for choosing [facility] to provide your oncology and hematology care.  If you have a lab appointment with the [facility], please go directly to the [facility] and check in at the registration area.  We strive to give you quality time with your provider. You may need to reschedule your appointment if you arrive late (15 or more minutes).  Arriving late affects you and other patients whose appointments are after yours.  Also, if you miss three or more appointments without notifying the office, you may be dismissed from the clinic at the provider's discretion.      For prescription refill requests, have your pharmacy contact our office and allow 72 hours for refills to be completed.    Today you received the following chemotherapy and/or immunotherapy agents: Vidaza.      To help prevent nausea and vomiting after your treatment, we encourage you to take your nausea medication as directed.  BELOW ARE SYMPTOMS THAT SHOULD BE REPORTED IMMEDIATELY: *FEVER GREATER THAN 100.4 F (38 C) OR HIGHER *CHILLS OR SWEATING *NAUSEA AND VOMITING THAT IS NOT CONTROLLED WITH YOUR NAUSEA MEDICATION *UNUSUAL SHORTNESS OF BREATH *UNUSUAL BRUISING OR BLEEDING *URINARY PROBLEMS (pain or burning when urinating, or frequent urination) *BOWEL PROBLEMS (unusual diarrhea, constipation, pain near the anus) TENDERNESS IN MOUTH AND THROAT WITH OR WITHOUT PRESENCE OF ULCERS (sore throat, sores in mouth, or a toothache) UNUSUAL RASH, SWELLING OR PAIN  UNUSUAL VAGINAL DISCHARGE OR ITCHING   Items with * indicate a potential emergency and should be followed up as soon as possible or go to the Emergency Department if any problems should occur.  Please show the CHEMOTHERAPY ALERT CARD or IMMUNOTHERAPY ALERT CARD at check-in to the Emergency Department and triage nurse.  Should you have questions after your visit or need to  cancel or reschedule your appointment, please contact [facility]  [phone] and follow the prompts.  Office hours are 8:00 a.m. to 4:30 p.m. Monday - Friday. Please note that voicemails left after 4:00 p.m. may not be returned until the following business day.  We are closed weekends and major holidays. You have access to a nurse at all times for urgent questions. Please call the main number to the clinic [phone] and follow the prompts.  For any non-urgent questions, you may also contact your provider using MyChart. We now offer e-Visits for anyone 57 and older to request care online for non-urgent symptoms. For details visit [email or web address].   Also download the MyChart app! Go to the app store, search "MyChart", open the app, select [facility], and log in with your MyChart username and password.  Due to Covid, a mask is required upon entering the hospital/clinic. If you do not have a mask, one will be given to you upon arrival. For doctor visits, patients may have 1 support person aged 49 or older with them. For treatment visits, patients cannot have anyone with them due to current Covid guidelines and our immunocompromised population.

## 2020-12-19 ENCOUNTER — Inpatient Hospital Stay: Payer: PPO

## 2020-12-19 ENCOUNTER — Ambulatory Visit: Admit: 2020-12-19 | Payer: PRIVATE HEALTH INSURANCE | Attending: Hematology | Primary: Hematology

## 2020-12-19 ENCOUNTER — Ambulatory Visit: Admit: 2020-12-19 | Payer: PRIVATE HEALTH INSURANCE

## 2020-12-19 VITALS — BP 129/63 | HR 71 | Temp 98.0°F | Resp 16

## 2020-12-19 DIAGNOSIS — C9200 Acute myeloblastic leukemia, not having achieved remission: Secondary | ICD-10-CM

## 2020-12-19 DIAGNOSIS — Z5111 Encounter for antineoplastic chemotherapy: Secondary | ICD-10-CM | POA: Diagnosis not present

## 2020-12-19 MED ORDER — ONDANSETRON HCL 4 MG PO TABS
8.0000 mg | ORAL_TABLET | Freq: Once | ORAL | Status: AC
  Administered 2020-12-19: 8 mg via ORAL
  Filled 2020-12-19: qty 2

## 2020-12-19 MED ORDER — AZACITIDINE CHEMO SQ INJECTION
50.0000 mg/m2 | Freq: Once | INTRAMUSCULAR | Status: AC
  Administered 2020-12-19: 87.5 mg via SUBCUTANEOUS
  Filled 2020-12-19: qty 3.5

## 2020-12-19 NOTE — Progress Notes (Signed)
Nutrition  RD was planning on seeing patient during infusion but patient finished before RD came to infusion.    Called patient via phone to follow-up.  Unable to talk at this time.  Will follow-up at later date.    B. Zenia Resides, [place], [place] Registered Dietitian [phone] (mobile)

## 2020-12-19 NOTE — Telephone Encounter (Addendum)
Patient has an appt at [facility] tomorrow (12/20/20).  Will print appt reminder for patient to receive at her visit.  Scheduled for [facility] on 01/02/21 to arrive at 8:00 am for 9:00 am appointment.  The IR nurse will call patient prior to the procedure to go over the instructions.   Patient is scheduled for f/u with MD on 01/16/21.

## 2020-12-19 NOTE — Patient Instructions (Signed)
[facility]COLOG[facility]  Discharge Instructions: Thank you for choosing [facility] to provide your oncology and hematology care.  If you have a lab appointment with the [facility], please go directly to the [facility] and check in at the registration area.  Wear comfortable clothing and clothing appropriate for easy access to any Portacath or PICC line.   We strive to give you quality time with your provider. You may need to reschedule your appointment if you arrive late (15 or more minutes).  Arriving late affects you and other patients whose appointments are after yours.  Also, if you miss three or more appointments without notifying the office, you may be dismissed from the clinic at the provider's discretion.      For prescription refill requests, have your pharmacy contact our office and allow 72 hours for refills to be completed.    Today you received the following chemotherapy and/or immunotherapy agents Vidaza      To help prevent nausea and vomiting after your treatment, we encourage you to take your nausea medication as directed.  BELOW ARE SYMPTOMS THAT SHOULD BE REPORTED IMMEDIATELY: *FEVER GREATER THAN 100.4 F (38 C) OR HIGHER *CHILLS OR SWEATING *NAUSEA AND VOMITING THAT IS NOT CONTROLLED WITH YOUR NAUSEA MEDICATION *UNUSUAL SHORTNESS OF BREATH *UNUSUAL BRUISING OR BLEEDING *URINARY PROBLEMS (pain or burning when urinating, or frequent urination) *BOWEL PROBLEMS (unusual diarrhea, constipation, pain near the anus) TENDERNESS IN MOUTH AND THROAT WITH OR WITHOUT PRESENCE OF ULCERS (sore throat, sores in mouth, or a toothache) UNUSUAL RASH, SWELLING OR PAIN  UNUSUAL VAGINAL DISCHARGE OR ITCHING   Items with * indicate a potential emergency and should be followed up as soon as possible or go to the Emergency Department if any problems should occur.  Please show the CHEMOTHERAPY ALERT CARD or IMMUNOTHERAPY ALERT CARD at check-in to  the Emergency Department and triage nurse.  Should you have questions after your visit or need to cancel or reschedule your appointment, please contact [facility]  [phone] and follow the prompts.  Office hours are 8:00 a.m. to 4:30 p.m. Monday - Friday. Please note that voicemails left after 4:00 p.m. may not be returned until the following business day.  We are closed weekends and major holidays. You have access to a nurse at all times for urgent questions. Please call the main number to the clinic [phone] and follow the prompts.  For any non-urgent questions, you may also contact your provider using MyChart. We now offer e-Visits for anyone 3 and older to request care online for non-urgent symptoms. For details visit [email or web address].   Also download the MyChart app! Go to the app store, search "MyChart", open the app, select [facility], and log in with your MyChart username and password.  Due to Covid, a mask is required upon entering the hospital/clinic. If you do not have a mask, one will be given to you upon arrival. For doctor visits, patients may have 1 support person aged 64 or older with them. For treatment visits, patients cannot have anyone with them due to current Covid guidelines and our immunocompromised population.

## 2020-12-20 ENCOUNTER — Encounter: Payer: Self-pay | Admitting: Oncology

## 2020-12-20 ENCOUNTER — Other Ambulatory Visit: Payer: Self-pay | Admitting: Internal Medicine

## 2020-12-20 ENCOUNTER — Inpatient Hospital Stay: Payer: PPO

## 2020-12-20 VITALS — BP 141/73 | HR 67 | Temp 98.0°F | Resp 16

## 2020-12-20 DIAGNOSIS — C9200 Acute myeloblastic leukemia, not having achieved remission: Secondary | ICD-10-CM

## 2020-12-20 DIAGNOSIS — Z5111 Encounter for antineoplastic chemotherapy: Secondary | ICD-10-CM | POA: Diagnosis not present

## 2020-12-20 MED ORDER — ONDANSETRON HCL 4 MG PO TABS
8.0000 mg | ORAL_TABLET | Freq: Once | ORAL | Status: AC
  Administered 2020-12-20: 8 mg via ORAL
  Filled 2020-12-20: qty 2

## 2020-12-20 MED ORDER — AZACITIDINE CHEMO SQ INJECTION
50.0000 mg/m2 | Freq: Once | INTRAMUSCULAR | Status: AC
  Administered 2020-12-20: 87.5 mg via SUBCUTANEOUS
  Filled 2020-12-20: qty 3.5

## 2020-12-20 NOTE — Patient Instructions (Signed)
[facility]  Discharge Instructions: Thank you for choosing [facility] to provide your oncology and hematology care.  If you have a lab appointment with the [facility], please go directly to the [facility] and check in at the registration area.  Wear comfortable clothing and clothing appropriate for easy access to any Portacath or PICC line.   We strive to give you quality time with your provider. You may need to reschedule your appointment if you arrive late (15 or more minutes).  Arriving late affects you and other patients whose appointments are after yours.  Also, if you miss three or more appointments without notifying the office, you may be dismissed from the clinic at the provider's discretion.      For prescription refill requests, have your pharmacy contact our office and allow 72 hours for refills to be completed.    Today you received the following chemotherapy and/or immunotherapy agents Vidaza       To help prevent nausea and vomiting after your treatment, we encourage you to take your nausea medication as directed.  BELOW ARE SYMPTOMS THAT SHOULD BE REPORTED IMMEDIATELY: *FEVER GREATER THAN 100.4 F (38 C) OR HIGHER *CHILLS OR SWEATING *NAUSEA AND VOMITING THAT IS NOT CONTROLLED WITH YOUR NAUSEA MEDICATION *UNUSUAL SHORTNESS OF BREATH *UNUSUAL BRUISING OR BLEEDING *URINARY PROBLEMS (pain or burning when urinating, or frequent urination) *BOWEL PROBLEMS (unusual diarrhea, constipation, pain near the anus) TENDERNESS IN MOUTH AND THROAT WITH OR WITHOUT PRESENCE OF ULCERS (sore throat, sores in mouth, or a toothache) UNUSUAL RASH, SWELLING OR PAIN  UNUSUAL VAGINAL DISCHARGE OR ITCHING   Items with * indicate a potential emergency and should be followed up as soon as possible or go to the Emergency Department if any problems should occur.  Please show the CHEMOTHERAPY ALERT CARD or IMMUNOTHERAPY ALERT CARD at check-in to  the Emergency Department and triage nurse.  Should you have questions after your visit or need to cancel or reschedule your appointment, please contact [facility]  [phone] and follow the prompts.  Office hours are 8:00 a.m. to 4:30 p.m. Monday - Friday. Please note that voicemails left after 4:00 p.m. may not be returned until the following business day.  We are closed weekends and major holidays. You have access to a nurse at all times for urgent questions. Please call the main number to the clinic [phone] and follow the prompts.  For any non-urgent questions, you may also contact your provider using MyChart. We now offer e-Visits for anyone 68 and older to request care online for non-urgent symptoms. For details visit [email or web address].   Also download the MyChart app! Go to the app store, search "MyChart", open the app, select [facility], and log in with your MyChart username and password.  Due to Covid, a mask is required upon entering the hospital/clinic. If you do not have a mask, one will be given to you upon arrival. For doctor visits, patients may have 1 support person aged 72 or older with them. For treatment visits, patients cannot have anyone with them due to current Covid guidelines and our immunocompromised population.

## 2020-12-24 ENCOUNTER — Telehealth: Payer: Self-pay

## 2020-12-24 NOTE — Telephone Encounter (Signed)
Nutrition  RD called patient for nutrition follow-up.  No answer and mailbox is full, unable to leave message.  Will follow-up as able   B. Zenia Resides, [place], [place] Registered Dietitian [phone] (mobile)

## 2020-12-31 ENCOUNTER — Other Ambulatory Visit: Payer: Self-pay | Admitting: Radiology

## 2020-12-31 NOTE — Progress Notes (Signed)
Patient on schedule for BMB 01/02/2021, called and spoke with patient on phone with pre procedure instructions given. Made aware to be here @ 0800, NPO after MN prior and driver post procedue/recovery/discharge. Stated understanding.

## 2021-01-02 ENCOUNTER — Other Ambulatory Visit: Payer: Self-pay

## 2021-01-02 ENCOUNTER — Ambulatory Visit
Admission: RE | Admit: 2021-01-02 | Discharge: 2021-01-02 | Disposition: A | Discharge status: Home / Self Care | Payer: PPO | Source: Ambulatory Visit | Attending: Oncology | Admitting: Oncology

## 2021-01-02 DIAGNOSIS — D61818 Other pancytopenia: Secondary | ICD-10-CM | POA: Diagnosis not present

## 2021-01-02 DIAGNOSIS — C9250 Acute myelomonocytic leukemia, not having achieved remission: Secondary | ICD-10-CM | POA: Diagnosis not present

## 2021-01-02 DIAGNOSIS — C9200 Acute myeloblastic leukemia, not having achieved remission: Secondary | ICD-10-CM

## 2021-01-02 LAB — CBC WITH DIFFERENTIAL/PLATELET
Abs Immature Granulocytes: 0.01 10*3/uL (ref 0.00–0.07)
Basophils Absolute: 0 10*3/uL (ref 0.0–0.1)
Basophils Relative: 0 %
Eosinophils Absolute: 0 10*3/uL (ref 0.0–0.5)
Eosinophils Relative: 0 %
HCT: 35.3 % — ABNORMAL LOW (ref 36.0–46.0)
Hemoglobin: 12.4 g/dL (ref 12.0–15.0)
Immature Granulocytes: 1 %
Lymphocytes Relative: 35 %
Lymphs Abs: 0.6 10*3/uL — ABNORMAL LOW (ref 0.7–4.0)
MCH: 28.7 pg (ref 26.0–34.0)
MCHC: 35.1 g/dL (ref 30.0–36.0)
MCV: 81.7 fL (ref 80.0–100.0)
Monocytes Absolute: 0.2 10*3/uL (ref 0.1–1.0)
Monocytes Relative: 12 %
Neutro Abs: 0.8 10*3/uL — ABNORMAL LOW (ref 1.7–7.7)
Neutrophils Relative %: 52 %
Platelets: 101 10*3/uL — ABNORMAL LOW (ref 150–400)
RBC: 4.32 MIL/uL (ref 3.87–5.11)
RDW: 15.4 % (ref 11.5–15.5)
WBC: 1.6 10*3/uL — ABNORMAL LOW (ref 4.0–10.5)
nRBC: 0 % (ref 0.0–0.2)

## 2021-01-02 MED ORDER — HEPARIN SOD (PORK) LOCK FLUSH 100 UNIT/ML IV SOLN
INTRAVENOUS | Status: AC
  Filled 2021-01-02: qty 5

## 2021-01-02 MED ORDER — SODIUM CHLORIDE 0.9 % IV SOLN: INTRAVENOUS | Status: DC

## 2021-01-02 MED ORDER — FENTANYL CITRATE (PF) 100 MCG/2ML IJ SOLN
INTRAMUSCULAR | Status: AC
  Filled 2021-01-02: qty 2

## 2021-01-02 MED ORDER — MIDAZOLAM HCL 5 MG/5ML IJ SOLN
INTRAMUSCULAR | Status: AC | PRN
  Administered 2021-01-02: 1 mg via INTRAVENOUS

## 2021-01-02 MED ORDER — MIDAZOLAM HCL 2 MG/2ML IJ SOLN
INTRAMUSCULAR | Status: AC
  Filled 2021-01-02: qty 2

## 2021-01-02 MED ORDER — FENTANYL CITRATE (PF) 100 MCG/2ML IJ SOLN
INTRAMUSCULAR | Status: AC | PRN
  Administered 2021-01-02 (×2): 50 ug via INTRAVENOUS

## 2021-01-02 NOTE — H&P (Signed)
Chief Complaint: Patient was seen in consultation today for AML at the request of Finnegan,Timothy J  Referring Physician(s): Finnegan,Timothy J  Supervising Physician: Arne Cleveland  Patient Status: [facility] - Out-pt  History of Present Illness: Debra Hale is a 70 y.o. 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 The patient has been seen by Dr. [name] on 8/16 with request received for repeat CT guided bone marrow biopsy today to evaluate for remission, last bone marrow biopsy in 07/2020. The patient has had a H&P performed within the last 30 days, all history, medications, and exam have been reviewed. The patient denies any interval changes since the H&P. She denies any chest pain, shortness of breath or palpitations. She denies any recent fever or chills. She has previously tolerated sedation without complications.    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   s/p surgery and XRT   Osteoporosis    Other and unspecified hyperlipidemia    Palpitations    Personal history of chemotherapy    Personal history of malignant neoplasm of large intestine    carcinoma - cecum, s/p right laparoscopic colectomy - s/p chemotherapy and XRT   Personal history of radiation therapy    Pneumonia 2019   PONV (postoperative nausea and vomiting)    Pure hypercholesterolemia    Unspecified hereditary and idiopathic peripheral neuropathy     Past Surgical History:  Procedure Laterality Date   APPENDECTOMY  1985   BREAST BIOPSY Right 2009   positive    BREAST BIOPSY Right 2009   negative   BREAST LUMPECTOMY Right 2009   breast cancer   CHOLECYSTECTOMY  1995   COLONOSCOPY     COLONOSCOPY WITH PROPOFOL N/A 10/29/2017   Procedure: COLONOSCOPY WITH PROPOFOL;  Surgeon: Manya Silvas, MD;  Location: [facility] ENDOSCOPY;  Service: Endoscopy;  Laterality: N/A;   DILATION AND CURETTAGE OF UTERUS  1990   DILATION AND CURETTAGE, DIAGNOSTIC / THERAPEUTIC  1990   ESOPHAGOGASTRODUODENOSCOPY     ESOPHAGOGASTRODUODENOSCOPY (EGD) WITH PROPOFOL N/A 10/29/2017   Procedure: ESOPHAGOGASTRODUODENOSCOPY (EGD) WITH PROPOFOL;  Surgeon: Manya Silvas, MD;  Location: [facility] ENDOSCOPY;  Service: Endoscopy;  Laterality: N/A;   INCISION AND DRAINAGE PERIRECTAL ABSCESS N/A 06/17/2020   Procedure: IRRIGATION AND DEBRIDEMENT PERIRECTAL ABSCESS;  Surgeon: Jules Husbands, MD;  Location: [facility] ORS;  Service: General;  Laterality: N/A;   LAPAROSCOPIC PARTIAL COLECTOMY     stage 3-C carcinoma of the cecum, s/p chemotherapy and xrt   LITHOTRIPSY     SIGMOIDOSCOPY  08/26/1993   THYROID LOBECTOMY  2002   s/p XRT   TOTAL HIP ARTHROPLASTY Right 10/24/2019   Procedure: TOTAL HIP ARTHROPLASTY;  Surgeon: Corky Mull, MD;  Location: [facility] ORS;  Service: Orthopedics;  Laterality: Right;    Allergies: Demeclocycline, Sulfa antibiotics, Tetracyclines & related, Augmentin [amoxicillin-pot clavulanate], Bentyl [dicyclomine hcl], Ciprofloxacin, Codeine, Dicyclomine hcl, Epinephrine, Flagyl [metronidazole], Librax [chlordiazepoxide-clidinium], Novocain [procaine], Phenobarbital, Prednisone, and  Ultram [tramadol]  Medications: Prior to Admission medications   Medication Sig Start Date End Date Taking? Authorizing Provider  clonazePAM (KLONOPIN) 0.5 MG tablet Take 1.5 mg by mouth 2 (two) times daily.  09/06/17  Yes [provider]  diphenoxylate-atropine (LOMOTIL) 2.5-0.025 MG tablet Take 1 tablet by mouth 4 (four) times daily as needed for diarrhea or loose stools. 11/20/20  Yes  Lloyd Huger, MD  escitalopram (LEXAPRO) 20 MG tablet Take 20 mg by mouth every morning.    Yes [provider]  levothyroxine (SYNTHROID) 125 MCG tablet Take 125 mcg by mouth daily before breakfast.   Yes [provider]  pantoprazole (PROTONIX) 40 MG tablet TAKE ONE TABLET (40 MG) BY MOUTH EVERY DAY 12/20/20  Yes [name], MD  Azelastine-Fluticasone 137-50 MCG/ACT SUSP Place 2 sprays into both nostrils daily. 11/12/20   [provider]  levothyroxine (SYNTHROID) 112 MCG tablet Take 112 mcg by mouth daily. Patient not taking: Reported on 12/17/2020 12/07/20   [provider]  loperamide (IMODIUM) 2 MG capsule Take by mouth.    [provider]  magnesium oxide (MAG-OX) 400 MG tablet Take 1 tablet (400 mg total) by mouth daily. Patient not taking: Reported on 01/02/2021 12/17/20   Lloyd Huger, MD  metoprolol tartrate (LOPRESSOR) 25 MG tablet TAKE 1 TABLET (25 MG TOTAL) BY MOUTH AS NEEDED (AS NEEDED UP TO TWICE DAILY FOR PALPITATIONS). Patient not taking: Reported on 01/02/2021 09/04/20   Loel Dubonnet, NP  potassium chloride SA (KLOR-CON) 20 MEQ tablet Take 1 tablet (20 mEq total) by mouth 2 (two) times daily. Patient not taking: Reported on 01/02/2021 12/17/20   Lloyd Huger, MD  valACYclovir (VALTREX) 500 MG tablet Take 1 tablet by mouth daily. 10/17/20   [provider]  venetoclax (VENCLEXTA) 100 MG tablet Take 400 mg by mouth daily. Take for 14 days, then hold for 14 days. Repeat every 28 days. Take with a meal and water.    [provider]     Family History  Problem Relation Age of Onset   Stroke Mother        23s   Alzheimer's disease Mother    Cancer Mother    Lung cancer Father    Prostate cancer Father    Cancer Father        Colon   Colon cancer Father    Breast cancer Sister        86's   Lung cancer Sister    Breast cancer Maternal Aunt     Social History   Socioeconomic History    Marital status: Single    Spouse name: Not on file   Number of children: 0   Years of education: Not on file   Highest education level: Not on file  Occupational History   Not on file  Tobacco Use   Smoking status: Never   Smokeless tobacco: Never  Vaping Use   Vaping Use: Never used  Substance and Sexual Activity   Alcohol use: No    Alcohol/week: 0.0 standard drinks   Drug use: No   Sexual activity: Not Currently  Other Topics Concern   Not on file  Social History Narrative   Not on file   Social Determinants of Health   Financial Resource Strain: Not on file  Food Insecurity: Not on file  Transportation Needs: Not on file  Physical Activity: Not on file  Stress: Not on file  Social Connections: Not on file  Review of Systems: A 12 point ROS discussed and pertinent positives are indicated in the HPI above.  All other systems are negative.  Review of Systems  Vital Signs: There were no vitals taken for this visit.  Physical Exam Constitutional:      Appearance: Normal appearance.  HENT:     Head: Normocephalic and atraumatic.  Cardiovascular:     Rate and Rhythm: Normal rate.  Pulmonary:     Effort: Pulmonary effort is normal. No respiratory distress.  Abdominal:     Palpations: Abdomen is soft.  Skin:    General: Skin is warm and dry.  Neurological:     Mental Status: She is alert and oriented to person, place, and time.    Imaging: No results found.  Labs:  CBC: Recent Labs    11/15/20 1606 11/18/20 1329 12/02/20 1141 12/16/20 1305  WBC 1.3* 1.2* 1.6* 1.5*  HGB 13.4 12.8 13.4 13.2  HCT 39.3 38.4 38.9 38.8  PLT 260 288 94* 203    COAGS: No results for input(s): INR, APTT in the last 8760 hours.  BMP: Recent Labs    11/15/20 1606 11/18/20 1329 12/02/20 1141 12/16/20 1305  NA 141 139 140 141  K 3.0* 3.6 2.8* 3.2*  CL 107 109 104 107  CO2 26 23 26 26   GLUCOSE 106* 109* 108* 99  BUN 17 19 15 17   CALCIUM 8.8* 9.2 8.3* 8.9   CREATININE 0.95 1.00 0.89 0.92  GFRNONAA >60 >60 >60 >60    LIVER FUNCTION TESTS: Recent Labs    11/05/20 1140 11/18/20 1329 12/02/20 1141 12/16/20 1305  BILITOT 0.8 0.5 0.9 0.9  AST 14* 12* 13* 16  ALT 18 20 21 26   ALKPHOS 118 109 115 115  PROT 6.5 6.3* 6.6 6.6  ALBUMIN 3.4* 3.5 3.8 3.8     Assessment and Plan: AML, undergoing treatment Seen by Dr. [name] 8/16, no medical changes since Request received for repeat CT guided bone marrow biopsy today to evaluate for remission, last bone marrow biopsy in 07/2020  The patient has been NPO, labs and vitals have been reviewed.  Risks and benefits of bone marrow biopsy was discussed with the patient and/or patient's family including, but not limited to bleeding, infection, damage to adjacent structures or low yield requiring additional tests.  All of the questions were answered and there is agreement to proceed.  Consent signed and in chart.   Thank you for this interesting consult.  I greatly enjoyed meeting Debra Hale and look forward to participating in their care.  A copy of this report was sent to the requesting provider on this date.  Electronically Signed: Hedy Jacob, PA-C 01/02/2021, 9:12 AM   I spent a total of  15 Minutes in face to face in clinical consultation, greater than 50% of which was counseling/coordinating care for AML.

## 2021-01-02 NOTE — Progress Notes (Signed)
Pt arrived to procedure with car keys in hand. States that she drove herself here and did not have a driver to take her home post-procedure. Patient stated she left her cell phone in the car and left unit to get her phone and call friends/family to secure a ride. Awaiting patient return.

## 2021-01-02 NOTE — Procedures (Signed)
  Procedure: CT bone marrow biopsy   EBL:   minimal Complications:  none immediate  See full dictation in [facility] PACS.  D.   MD Main # [phone] Pager  [phone]    

## 2021-01-07 LAB — SURGICAL PATHOLOGY

## 2021-01-08 MED FILL — VENCLEXTA 100 MG TABLET: 28 days supply | Qty: 56 | Fill #1

## 2021-01-10 ENCOUNTER — Encounter: Payer: Self-pay | Admitting: Physician Assistant

## 2021-01-10 ENCOUNTER — Telehealth: Payer: Self-pay | Admitting: Oncology

## 2021-01-10 ENCOUNTER — Other Ambulatory Visit: Payer: Self-pay

## 2021-01-10 ENCOUNTER — Other Ambulatory Visit
Admission: RE | Admit: 2021-01-10 | Discharge: 2021-01-10 | Disposition: A | Discharge status: Home / Self Care | Payer: PPO | Attending: Physician Assistant | Admitting: Physician Assistant

## 2021-01-10 ENCOUNTER — Ambulatory Visit: Payer: PPO | Admitting: Physician Assistant

## 2021-01-10 VITALS — BP 120/64 | HR 89 | Ht 64.0 in | Wt 127.1 lb

## 2021-01-10 DIAGNOSIS — R9431 Abnormal electrocardiogram [ECG] [EKG]: Secondary | ICD-10-CM

## 2021-01-10 DIAGNOSIS — Z8679 Personal history of other diseases of the circulatory system: Secondary | ICD-10-CM | POA: Diagnosis not present

## 2021-01-10 DIAGNOSIS — Z79899 Other long term (current) drug therapy: Secondary | ICD-10-CM | POA: Diagnosis not present

## 2021-01-10 DIAGNOSIS — Z87898 Personal history of other specified conditions: Secondary | ICD-10-CM

## 2021-01-10 DIAGNOSIS — R002 Palpitations: Secondary | ICD-10-CM

## 2021-01-10 DIAGNOSIS — Z8639 Personal history of other endocrine, nutritional and metabolic disease: Secondary | ICD-10-CM | POA: Diagnosis not present

## 2021-01-10 DIAGNOSIS — R7989 Other specified abnormal findings of blood chemistry: Secondary | ICD-10-CM | POA: Diagnosis not present

## 2021-01-10 LAB — BASIC METABOLIC PANEL
Anion gap: 6 (ref 5–15)
BUN: 16 mg/dL (ref 8–23)
CO2: 28 mmol/L (ref 22–32)
Calcium: 8.5 mg/dL — ABNORMAL LOW (ref 8.9–10.3)
Chloride: 108 mmol/L (ref 98–111)
Creatinine, Ser: 0.89 mg/dL (ref 0.44–1.00)
GFR, Estimated: >60 mL/min (ref >60)
Glucose, Bld: 77 mg/dL (ref 70–99)
Potassium: 3.3 mmol/L — ABNORMAL LOW (ref 3.5–5.1)
Sodium: 142 mmol/L (ref 135–145)

## 2021-01-10 LAB — MAGNESIUM: Magnesium: 1.6 mg/dL — ABNORMAL LOW (ref 1.7–2.4)

## 2021-01-10 NOTE — Telephone Encounter (Signed)
Patient notified

## 2021-01-10 NOTE — Telephone Encounter (Signed)
Tried calling patient to inform her that she is NOT to take the Venetoclax but no answer and voicemail box is full.

## 2021-01-10 NOTE — Progress Notes (Signed)
Office Visit    Patient Name: Debra Hale Date of Encounter: 01/10/2021  PCP:  [name], [place]  Cardiologist:  Kathlyn Sacramento, MD  Advanced Practice Provider:  No care team member to display Electrophysiologist:  None :[id number]   Chief Complaint    Chief Complaint  Patient presents with   Other    3 month f/u no complaints today. Meds reviewed verbally with pt.    70 y.o. female with history of AML, recurrent pericarditis, hyperlipidemia, thyroid carcinoma s/p surgery and XRT, colon cancer s/p laparoscopic right colectomy, nephrolithiasis, anxiety, breast cancer, osteoporosis, palpitations in the setting of hypokalemia/hypomagnesia, and who presents today for 3 mo follow-up.  Past Medical History    Past Medical History:  Diagnosis Date   Anxiety    Arthritis    Osteoarthritis   Breast cancer ([place]) 2009   right breast lumpectomy with rad tx   Colon cancer ([place])    surgery with chemo and rad tx   Complication of anesthesia    GERD (gastroesophageal reflux disease)    Heart palpitations    History of hiatal hernia    History of kidney stones    Hypothyroidism    Liver disease    Liver nodule    s/p negative biopsy   Malignant neoplasm of thyroid gland ([place]) 2002   s/p surgery and XRT   Osteoporosis    Other and unspecified hyperlipidemia    Palpitations    Personal history of chemotherapy    Personal history of malignant neoplasm of large intestine    carcinoma - cecum, s/p right laparoscopic colectomy - s/p chemotherapy and XRT   Personal history of radiation therapy    Pneumonia 2019   PONV (postoperative nausea and vomiting)    Pure hypercholesterolemia    Unspecified hereditary and idiopathic peripheral neuropathy    Past Surgical History:  Procedure Laterality Date   APPENDECTOMY  1985   BREAST BIOPSY Right 2009   positive   BREAST BIOPSY Right 2009   negative   BREAST LUMPECTOMY Right 2009   breast  cancer   CHOLECYSTECTOMY  1995   COLONOSCOPY     COLONOSCOPY WITH PROPOFOL N/A 10/29/2017   Procedure: COLONOSCOPY WITH PROPOFOL;  Surgeon: Manya Silvas, MD;  Location: [facility] ENDOSCOPY;  Service: Endoscopy;  Laterality: N/A;   DILATION AND CURETTAGE OF UTERUS  1990   DILATION AND CURETTAGE, DIAGNOSTIC / THERAPEUTIC  1990   ESOPHAGOGASTRODUODENOSCOPY     ESOPHAGOGASTRODUODENOSCOPY (EGD) WITH PROPOFOL N/A 10/29/2017   Procedure: ESOPHAGOGASTRODUODENOSCOPY (EGD) WITH PROPOFOL;  Surgeon: Manya Silvas, MD;  Location: [facility] ENDOSCOPY;  Service: Endoscopy;  Laterality: N/A;   INCISION AND DRAINAGE PERIRECTAL ABSCESS N/A 06/17/2020   Procedure: IRRIGATION AND DEBRIDEMENT PERIRECTAL ABSCESS;  Surgeon: Jules Husbands, MD;  Location: [facility] ORS;  Service: General;  Laterality: N/A;   LAPAROSCOPIC PARTIAL COLECTOMY     stage 3-C carcinoma of the cecum, s/p chemotherapy and xrt   LITHOTRIPSY     SIGMOIDOSCOPY  08/26/1993   THYROID LOBECTOMY  2002   s/p XRT   TOTAL HIP ARTHROPLASTY Right 10/24/2019   Procedure: TOTAL HIP ARTHROPLASTY;  Surgeon: Corky Mull, MD;  Location: [facility] ORS;  Service: Orthopedics;  Laterality: Right;    Allergies  Allergies  Allergen Reactions   Demeclocycline Other (See Comments)    Throat swells   Sulfa Antibiotics Other (See Comments)    Other reaction(s): Other (See Comments) Throat swells Other reaction(s):  Other (See Comments) Throat swells   Tetracyclines & Related Other (See Comments)    Throat swells    Augmentin [Amoxicillin-Pot Clavulanate] Diarrhea   Bentyl [Dicyclomine Hcl]     unkn   Ciprofloxacin Diarrhea   Codeine Other (See Comments)    dizziness     Dicyclomine Hcl Other (See Comments)    unkn   Epinephrine     Speeds heart up - panic    Flagyl [Metronidazole] Nausea And Vomiting   Librax [Chlordiazepoxide-Clidinium]     unkn   Novocain [Procaine] Other (See Comments)    "Shaky"   Phenobarbital     unkn   Prednisone     Nervous      Ultram [Tramadol] Other (See Comments)    Sick feeling    History of Present Illness    Debra Hale is a 70 y.o. female with PMH as above.  She has history of AML, recurrent pericarditis, hyperlipidemia, thyroid Carcinoma s/p surgery and XRT, colon cancer s/p laparoscopic right colectomy, nephrolithiasis, anxiety, osteoporosis, breast cancer, palpitations in the setting of hypokalemia /hypomagnesia.  Previous 2017 treadmill stress test without evidence of ischemia with poor exercise tolerance and hypertensive response to exercise.  She has had recurrent pericarditis and previous small pericardial effusion since 2017.  She had suspected pericarditis in the setting of upper respiratory infection in 2020.  Sedimentation rate of 70.  Echo 05/2019 with LVEF 50 to 55% and no pericardial effusion.  She was treated with colchicine and NSAIDs.  Seen 09/2019 for preoperative evaluation and recommendation for [facility], which was ruled a low risk study without evidence of ischemia.  She was seen by her primary cardiologist 03/2020 with minimal palpitations and work-up deferred.  She was admitted 06/2020 due to perirectal abscess.  Her ventoxlax for AML was discontinued and transition to [place].  March 2022 lab work showed ongoing hypokalemia with potassium 3.6.    She was last seen in office 08/09/2020 and reported increasing palpitations over the last month.  She reported her stress level is about the same as previous visits.  She did report that everything was stressful.  She was drinking water throughout the day, estimated at approximately 1.516 ounce bottles of water.  Palpitations were reportedly short-lived and most often a daily occurrence.  They were more noticeable when she laid on her left side.  She reported dyspnea on exertion, attributed to sedentary lifestyle/inactivity.  Recommendation was to take Lopressor 12.5 mg twice daily as needed for palpitations.  She was encouraged to stay  well-hydrated, given she was only drinking 1.516 ounce bottles of water per day.  09/05/2020 echo showed LVEF 60 to 65%, and R WMA, G1 DD, RVSP 32.9 mmHg, estimated RAP 3 mmHg. Zio monitor was reportedly lost.  She was seen 6/1 and felt her palpitations were related to caffeine and decreased since her previous clinic visit.  She had a history of loose bowels her entire life, worse with her leukemia.  Palpitations were worse when she was on her left side, so she avoided this position. She noted deconditioning with stable DOE. She had not taken any Lopressor 12.5 mg twice daily, which was prescribed at her previous clinic visit.    Today, 01/10/2021, she returns to clinic and notes improvement in her palpitations from even her previous visit.  She is now able to lay on her left side.  She has not needed her Lopressor.  Her best friend is currently passing away, so she is under a  lot of stress.  When she does have palpitations, she believes they are related to these stressors or when angry/upset.  She denies any chest pain or shortness of breath.  She has recently had more diarrhea and has been using her diarrhea medication more often.  She is still undergoing treatment for leukemia and about to restart the treatment session.  She denies any bloating and less overeating.  No lower extremity edema or PND.  No orthopnea.  No signs or symptoms of bleeding.  She sees her PCP at the end of September.  Home Medications   Current Outpatient Medications  Medication Instructions   Azelastine-Fluticasone 137-50 MCG/ACT SUSP 2 sprays, Each Nare, Daily   clonazePAM (KLONOPIN) 1.5 mg, Oral, 2 times daily   diphenoxylate-atropine (LOMOTIL) 2.5-0.025 MG tablet 1 tablet, Oral, 4 times daily PRN   levothyroxine (SYNTHROID) 125 mcg, Oral, Daily before breakfast   Lexapro 20 mg, Oral, BH-each morning   loperamide (IMODIUM) 2 MG capsule Oral   magnesium oxide (MAG-OX) 400 mg, Oral, Daily   metoprolol tartrate (LOPRESSOR)  25 mg, Oral, As needed   pantoprazole (PROTONIX) 40 MG tablet TAKE ONE TABLET (40 MG) BY MOUTH EVERY DAY   potassium chloride SA (KLOR-CON) 20 MEQ tablet 20 mEq, Oral, 2 times daily   valACYclovir (VALTREX) 500 MG tablet 1 tablet, Oral, Daily   venetoclax (VENCLEXTA) 400 mg, Daily     Review of Systems    She reports palpitations only when upset or stressed. She denies chest pain pnd, dyspnea, shortness of breath, orthopnea, syncope, edema, weight gain, or early satiety.   All other systems reviewed and are otherwise negative except as noted above.  Physical Exam    VS:  BP 120/64 (BP Location: Left Arm, Patient Position: Sitting, Cuff Size: Normal)   Pulse 89   Ht '5\' 4"'$  (1.626 m)   Wt 127 lb 2 oz (57.7 kg)   SpO2 98%   BMI 21.82 kg/m  , BMI Body mass index is 21.82 kg/m. GEN: Well nourished, well developed, in no acute distress. HEENT: normal. Neck: Supple, no JVD, carotid bruits, or masses. Cardiac: RRR, no murmurs, rubs, or gallops. No clubbing, cyanosis, edema.  Radials/DP/PT 2+ and equal bilaterally.  Respiratory:  Respirations regular and unlabored, clear to auscultation bilaterally. GI: Soft, nontender, nondistended, BS + x 4. MS: no deformity or atrophy. Skin: warm and dry, no rash. Neuro:  Strength and sensation are intact. Psych: Normal affect.  Accessory Clinical Findings    ECG personally reviewed by me today -NSR, 80bpm, low voltage, nonspecific changes as noted on prior EKGs  - no acute changes.  VITALS Reviewed today   Temp Readings from Last 3 Encounters:  01/02/21 97.7 F (36.5 C) (Oral)  12/20/20 98 F (36.7 C) (Tympanic)  12/19/20 98 F (36.7 C) (Tympanic)   BP Readings from Last 3 Encounters:  01/10/21 120/64  01/02/21 130/87  12/20/20 (!) 141/73   Pulse Readings from Last 3 Encounters:  01/10/21 89  01/02/21 72  12/20/20 67    Wt Readings from Last 3 Encounters:  01/10/21 127 lb 2 oz (57.7 kg)  01/02/21 125 lb (56.7 kg)  12/17/20 124 lb  (56.2 kg)     LABS  reviewed today   Lab Results  Component Value Date   WBC 1.6 (L) 01/02/2021   HGB 12.4 01/02/2021   HCT 35.3 (L) 01/02/2021   MCV 81.7 01/02/2021   PLT 101 (L) 01/02/2021   Lab Results  Component Value Date  CREATININE 0.92 12/16/2020   BUN 17 12/16/2020   NA 141 12/16/2020   K 3.2 (L) 12/16/2020   CL 107 12/16/2020   CO2 26 12/16/2020   Lab Results  Component Value Date   ALT 26 12/16/2020   AST 16 12/16/2020   ALKPHOS 115 12/16/2020   BILITOT 0.9 12/16/2020   Lab Results  Component Value Date   CHOL 163 02/20/2020   HDL 40.70 02/20/2020   LDLCALC 100 (H) 02/20/2020   LDLDIRECT 126.0 07/11/2015   TRIG 110.0 02/20/2020   CHOLHDL 4 02/20/2020    Lab Results  Component Value Date   HGBA1C 5.3 12/10/2016   Lab Results  Component Value Date   TSH 0.034 (L) 10/02/2020     STUDIES/PROCEDURES reviewed today   ETT 04/2016 Blood pressure demonstrated a hypertensive response to exercise. There was no ST segment deviation noted during stress. No T wave inversion was noted during stress.   Normal treadmill stress test with no evidence of ischemia. Poor exercise capacity with hypertensive response to exercise   Echo 05/2018 - Left ventricle: The cavity size was normal. There was mild    concentric hypertrophy. Systolic function was normal. The    estimated ejection fraction was in the range of 50% to 55%. Wall    motion was normal; there were no regional wall motion    abnormalities. Doppler parameters are consistent with abnormal    left ventricular relaxation (grade 1 diastolic dysfunction).  - Pericardium, extracardiac: There was no pericardial effusion.   Echo 09/05/20  1. Left ventricular ejection fraction, by estimation, is 60 to 65%. The  left ventricle has normal function. The left ventricle has no regional  wall motion abnormalities. Left ventricular diastolic parameters are  consistent with Grade I diastolic  dysfunction (impaired  relaxation).   2. Right ventricular systolic function is normal. The right ventricular  size is normal. There is normal pulmonary artery systolic pressure. The  estimated right ventricular systolic pressure is 99991111 mmHg.   3. The inferior vena cava is normal in size with greater than 50%  respiratory variability, suggesting right atrial pressure of 3 mmHg.   Assessment & Plan    Palpitations, improved History of hypokalemia, hypomagnesia -- Reports palpitations only when stressed or upset, significantly improved from previous visits.  Zio was lost on review of online documentation.  Echo EF 60 to 65%, NR WMA, G1 DD, RS VP 32.9 mmHg.  Euvolemic on exam. She does have a history of hypokalemia and hypomagnesia with ongoing history of loose bowels (chronic, ongoing).  We will recheck a BMET magnesium.  QTC on recheck WNL.  Continue current potassium and magnesium as prescribed per PCP.  Continue Lopressor 12.5 mg twice daily as needed for palpitations.  Recurrent pericarditis/pericardial effusion Diastolic dysfunction -- Denies any symptoms consistent with recurrent pericarditis.  G1DD stable with pt euvolemic on exam. She will let us know if any new or worsening sx. No indication for a diuretic at this time. No evidence of pericardial effusion/pericarditis.  Abnormal TSH --Per PCP.  Medication changes: None Labs ordered: Magnesium, BMET Studies / Imaging ordered: None Future considerations: None Disposition: RTC 4-6 mo   *Please be aware that the above documentation was completed voice recognition software and may contain dictation errors.    Arvil Chaco, PA-C 01/10/2021

## 2021-01-10 NOTE — Telephone Encounter (Signed)
Patient states she is due to start treatment next week but doesn't see Dr. [name] until Thursday 9/15.  She is requesting clarification as to whether she should start her pills from [facility] that she takes in conjunction or wait until her visit.  Routing to clinical team for follow up.

## 2021-01-10 NOTE — Patient Instructions (Addendum)
Medication Instructions:  Please continue your current medications as prescribe.   *If you need a refill on your cardiac medications before your next appointment, please call your pharmacy*   Lab Work: BMET & Magnesium  LABS WILL APPEAR ON MYCHART, ABNORMAL RESULTS WILL BE CALLED  Testing/Procedures: None  Follow-Up: At [facility], you and your health needs are our priority.  As part of our continuing mission to provide you with exceptional heart care, we have created designated Provider Care Teams.  These Care Teams include your primary Cardiologist (physician) and Advanced Practice Providers (APPs -  Physician Assistants and Nurse Practitioners) who all work together to provide you with the care you need, when you need it.   Your next appointment:   4-6 months  The format for your next appointment:   In Person  Provider:   You may see Kathlyn Sacramento, MD or one of the following Advanced Practice Providers on your designated Care Team:    Marrianne Mood, [place]

## 2021-01-11 NOTE — Progress Notes (Signed)
Debra Hale  [phone] [phone]  ID: CHEVI LIM OB: 03-30-1951  MR#: [id number]  CSN#:[id number]  Patient Care Team: [name], MD as PCP - General (Internal Medicine) Wellington Hampshire, MD as PCP - Cardiology (Cardiology)  CHIEF COMPLAINT: AML.  INTERVAL HISTORY: Patient returns to clinic today for further evaluation, discussion of her bone marrow biopsy results, and deciding on whether or not to continue treatment.  She currently feels well and is asymptomatic.  She does not complain of weakness or fatigue today.  She denies any pain.  She has no neurologic complaints.  She denies any recent fevers or illnesses. She has no chest pain, shortness of breath, cough, or hemoptysis.  She denies any nausea, vomiting, constipation, or diarrhea.  She has no urinary complaints.  Patient offers no specific complaints today.    REVIEW OF SYSTEMS:   Review of Systems  Constitutional: Negative.  Negative for fever, malaise/fatigue and weight loss.  Respiratory: Negative.  Negative for cough, hemoptysis and shortness of breath.   Cardiovascular: Negative.  Negative for chest pain and leg swelling.  Gastrointestinal: Negative.  Negative for abdominal pain and diarrhea.  Genitourinary: Negative.  Negative for dysuria.  Musculoskeletal: Negative.  Negative for back pain.  Skin: Negative.  Negative for rash.  Neurological: Negative.  Negative for dizziness, focal weakness, weakness and headaches.  Psychiatric/Behavioral: Negative.  Negative for memory loss. The patient is not nervous/anxious.    As per HPI. Otherwise, a complete review of systems is negative.  PAST MEDICAL HISTORY: Past Medical History:  Diagnosis Date   Anxiety    Arthritis    Osteoarthritis   Breast cancer ([place]) 2009   right breast lumpectomy with rad tx   Colon cancer ([place])    surgery with chemo and rad tx   Complication of anesthesia    GERD (gastroesophageal reflux  disease)    Heart palpitations    History of hiatal hernia    History of kidney stones    Hypothyroidism    Liver disease    Liver nodule    s/p negative biopsy   Malignant neoplasm of thyroid gland ([place]) 2002   s/p surgery and XRT   Osteoporosis    Other and unspecified hyperlipidemia    Palpitations    Personal history of chemotherapy    Personal history of malignant neoplasm of large intestine    carcinoma - cecum, s/p right laparoscopic colectomy - s/p chemotherapy and XRT   Personal history of radiation therapy    Pneumonia 2019   PONV (postoperative nausea and vomiting)    Pure hypercholesterolemia    Unspecified hereditary and idiopathic peripheral neuropathy     PAST SURGICAL HISTORY: Past Surgical History:  Procedure Laterality Date   APPENDECTOMY  1985   BREAST BIOPSY Right 2009   positive   BREAST BIOPSY Right 2009   negative   BREAST LUMPECTOMY Right 2009   breast cancer   CHOLECYSTECTOMY  1995   COLONOSCOPY     COLONOSCOPY WITH PROPOFOL N/A 10/29/2017   Procedure: COLONOSCOPY WITH PROPOFOL;  Surgeon: Manya Silvas, MD;  Location: [facility] ENDOSCOPY;  Service: Endoscopy;  Laterality: N/A;   DILATION AND CURETTAGE OF UTERUS  1990   DILATION AND CURETTAGE, DIAGNOSTIC / THERAPEUTIC  1990   ESOPHAGOGASTRODUODENOSCOPY     ESOPHAGOGASTRODUODENOSCOPY (EGD) WITH PROPOFOL N/A 10/29/2017   Procedure: ESOPHAGOGASTRODUODENOSCOPY (EGD) WITH PROPOFOL;  Surgeon: Manya Silvas, MD;  Location: [facility] ENDOSCOPY;  Service: Endoscopy;  Laterality: N/A;  INCISION AND DRAINAGE PERIRECTAL ABSCESS N/A 06/17/2020   Procedure: IRRIGATION AND DEBRIDEMENT PERIRECTAL ABSCESS;  Surgeon: Jules Husbands, MD;  Location: [facility] ORS;  Service: General;  Laterality: N/A;   LAPAROSCOPIC PARTIAL COLECTOMY     stage 3-C carcinoma of the cecum, s/p chemotherapy and xrt   LITHOTRIPSY     SIGMOIDOSCOPY  08/26/1993   THYROID LOBECTOMY  2002   s/p XRT   TOTAL HIP ARTHROPLASTY Right 10/24/2019    Procedure: TOTAL HIP ARTHROPLASTY;  Surgeon: Corky Mull, MD;  Location: [facility] ORS;  Service: Orthopedics;  Laterality: Right;    FAMILY HISTORY: Family History  Problem Relation Age of Onset   Stroke Mother        91s   Alzheimer's disease Mother    Cancer Mother    Lung cancer Father    Prostate cancer Father    Cancer Father        Colon   Colon cancer Father    Breast cancer Sister        30's   Lung cancer Sister    Breast cancer Maternal Aunt     ADVANCED DIRECTIVES (Y/N):  N  HEALTH MAINTENANCE: Social History   Tobacco Use   Smoking status: Never   Smokeless tobacco: Never  Vaping Use   Vaping Use: Never used  Substance Use Topics   Alcohol use: No    Alcohol/week: 0.0 standard drinks   Drug use: No     Colonoscopy:  PAP:  Bone density:  Lipid panel:  Allergies  Allergen Reactions   Demeclocycline Other (See Comments)    Throat swells   Sulfa Antibiotics Other (See Comments)    Other reaction(s): Other (See Comments) Throat swells Other reaction(s): Other (See Comments) Throat swells   Tetracyclines & Related Other (See Comments)    Throat swells    Augmentin [Amoxicillin-Pot Clavulanate] Diarrhea   Bentyl [Dicyclomine Hcl]     unkn   Ciprofloxacin Diarrhea   Codeine Other (See Comments)    dizziness     Dicyclomine Hcl Other (See Comments)    unkn   Epinephrine     Speeds heart up - panic    Flagyl [Metronidazole] Nausea And Vomiting   Librax [Chlordiazepoxide-Clidinium]     unkn   Novocain [Procaine] Other (See Comments)    "Shaky"   Phenobarbital     unkn   Prednisone     Nervous     Ultram [Tramadol] Other (See Comments)    Sick feeling    Current Outpatient Medications  Medication Sig Dispense Refill   Azelastine-Fluticasone 137-50 MCG/ACT SUSP Place 2 sprays into both nostrils daily.     clonazePAM (KLONOPIN) 0.5 MG tablet Take 1.5 mg by mouth 2 (two) times daily.   0   diphenoxylate-atropine (LOMOTIL) 2.5-0.025 MG  tablet Take 1 tablet by mouth 4 (four) times daily as needed for diarrhea or loose stools. 120 tablet 1   escitalopram (LEXAPRO) 20 MG tablet Take 20 mg by mouth every morning.      levothyroxine (SYNTHROID) 125 MCG tablet Take 125 mcg by mouth daily before breakfast.     loperamide (IMODIUM) 2 MG capsule Take by mouth.     magnesium oxide (MAG-OX) 400 MG tablet Take 1 tablet (400 mg total) by mouth daily. 30 tablet 0   metoprolol tartrate (LOPRESSOR) 25 MG tablet TAKE 1 TABLET (25 MG TOTAL) BY MOUTH AS NEEDED (AS NEEDED UP TO TWICE DAILY FOR PALPITATIONS). 60 tablet 1   pantoprazole (PROTONIX)  40 MG tablet TAKE ONE TABLET (40 MG) BY MOUTH EVERY DAY 90 tablet 2   potassium chloride SA (KLOR-CON) 20 MEQ tablet Take 1 tablet (20 mEq total) by mouth 2 (two) times daily. 60 tablet 0   valACYclovir (VALTREX) 500 MG tablet Take 1 tablet by mouth daily.     venetoclax (VENCLEXTA) 100 MG tablet Take 400 mg by mouth daily. Take for 14 days, then hold for 14 days. Repeat every 28 days. Take with a meal and water.     Current Facility-Administered Medications  Medication Dose Route Frequency Provider Last Rate Last Admin   ondansetron (ZOFRAN) tablet 8 mg  8 mg Oral Once Guse, Jacquelynn Cree, FNP       Facility-Administered Medications Ordered in Other Visits  Medication Dose Route Frequency Provider Last Rate Last Admin   potassium chloride (KLOR-CON) CR tablet 40 mEq  40 mEq Oral BID Lloyd Huger, MD   40 mEq at 07/08/20 1136    OBJECTIVE: Vitals:   01/16/21 1419  BP: 130/85  Pulse: 94  Resp: 16  Temp: (!) 96 F (35.6 C)     Body mass index is 21.56 kg/m.    ECOG FS:0 - Asymptomatic   General: Well-developed, well-nourished, no acute distress. Eyes: Pink conjunctiva, anicteric sclera. HEENT: Normocephalic, moist mucous membranes. Lungs: No audible wheezing or coughing. Heart: Regular rate and rhythm. Abdomen: Soft, nontender, no obvious distention. Musculoskeletal: No edema, cyanosis,  or clubbing. Neuro: Alert, answering all questions appropriately. Cranial nerves grossly intact. Skin: No rashes or petechiae noted. Psych: Normal affect.   LAB RESULTS:  Lab Results  Component Value Date   NA 142 01/16/2021   K 4.4 01/16/2021   CL 108 01/16/2021   CO2 26 01/16/2021   GLUCOSE 103 (H) 01/16/2021   BUN 20 01/16/2021   CREATININE 0.90 01/16/2021   CALCIUM 9.0 01/16/2021   PROT 7.0 01/16/2021   ALBUMIN 4.0 01/16/2021   AST 16 01/16/2021   ALT 31 01/16/2021   ALKPHOS 122 01/16/2021   BILITOT 0.8 01/16/2021   GFRNONAA >60 01/16/2021   GFRAA 54 (L) 10/26/2019    Lab Results  Component Value Date   WBC 1.5 (L) 01/16/2021   NEUTROABS 0.7 (L) 01/16/2021   HGB 14.2 01/16/2021   HCT 42.8 01/16/2021   MCV 83.8 01/16/2021   PLT 182 01/16/2021     STUDIES: CT BONE MARROW BIOPSY  Result Date: 01/02/2021 CLINICAL DATA:  Acute myeloid leukemia not having achieved remission EXAM: CT GUIDED DEEP ILIAC BONE ASPIRATION AND CORE BIOPSY TECHNIQUE: Patient was placed prone on the CT gantry and limited axial scans through the pelvis were obtained. Appropriate skin entry site was identified. Skin site was marked, prepped with chlorhexidine, draped in usual sterile fashion, and infiltrated locally with 1% lidocaine. Intravenous Fentanyl 174mg and Versed 148mwere administered as conscious sedation during continuous monitoring of the patient's level of consciousness and physiological / cardiorespiratory status by the radiology RN, with a total moderate sedation time of 16 minutes. Under CT fluoroscopic guidance an 11-gauge Cook trocar bone needle was advanced into the right iliac bone just lateral to the sacroiliac joint. Once needle tip position was confirmed, core and aspiration samples were obtained, submitted to pathology for approval. Post procedure scans show no hematoma or fracture. Patient tolerated procedure well. COMPLICATIONS: COMPLICATIONS none IMPRESSION: 1. Technically  successful CT guided right iliac bone core and aspiration biopsy. Electronically Signed   By: D Lucrezia Europe.D.   On: 01/02/2021 14:11  ASSESSMENT: AML.  PLAN:    1.  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  Her peripheral blood flow cytometry also revealed 6% aberrant myeloblasts.  Patient completed cycle 1 of induction therapy at [facility] with Vidaza and venetoclax.  She only received 5 days of Vidaza for cycle 2 recently which was interrupted secondary to significant diarrhea and a perirectal abscess.  Repeat bone marrow biopsy on January 02, 2021 did not reveal any overt disease, but cytogenetics remained positive likely indicating a very good partial remission.  Patient continues to receive venetoclax from [facility] despite not being evaluated there since at least June 2022.  Will continue maintenance treatment with Vidaza on days 1 through 5 every 28 days.  Return to clinic on February 03, 2021 to initiate cycle 9 of treatment. 2.  Neutropenia: Chronic and unchanged.  Bone marrow biopsy as above. 3.  Thrombocytopenia: Resolved. 4.  Perirectal abscess: Resolved.   5.  Diarrhea: Patient does not complain of this today.  Continue Lomotil as needed. 6.  Supportive care: Patient has been given referrals to home health and home palliative care.  Appreciate palliative care input.   7. 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  Patient also received chemotherapy and likely received Adriamycin, but exact regimen is unknown.  She completed 5 years of hormonal therapy in approximately June 2015. Currently, she has no evidence of disease.  Her most recent mammogram on March 05, 2020 was reported as BI-RADS 1.  Repeat in November 2022. 8.  History of colon cancer: Patient also states that she received chemotherapy for this, possibly FOLFOX but again this  is unknown. 9.  Hypokalemia: Resolved. 10.  Hypomagnesia: Magnesium is 1.7 today.  Continue oral magnesium supplementation as prescribed.  Patient expressed understanding and was in agreement with this plan. She also understands that She can call clinic at any time with any questions, concerns, or complaints.   Cancer Staging History of breast cancer Staging form: Breast, AJCC 7th Edition - Clinical stage from 01/07/2016: Stage IA (T1c, N0, M0) - Signed by Lloyd Huger, MD on 01/07/2016 Laterality: Right Estrogen receptor status: Positive Progesterone receptor status: Positive HER2 status: Negative   Lloyd Huger, MD   01/17/2021 5:35 PM

## 2021-01-12 ENCOUNTER — Encounter: Payer: Self-pay | Admitting: Physician Assistant

## 2021-01-13 ENCOUNTER — Telehealth: Payer: Self-pay | Admitting: Physician Assistant

## 2021-01-13 NOTE — Telephone Encounter (Signed)
I spoke with the patient regarding her lab results and Marrianne Mood, PA's recommendations to:  1) increase potassium to 40 mg BID x days, then resume 20 mg BID 2) increase magnesium to 400 mg BID 3) follow up with PCP for repeat BMP/ Magnesium as previously discussed  The patient voices understanding of these results, however, she advised that she has not been taking magnesium and her oncologist just called this in for her. She advised she is just starting the magnesium 400 mg once daily per oncology.  She is due to follow up with Dr. [name] soon.  I advised I will notify Jacquelyn she is just starting magnesium- we did discuss the possibility of increased diarrhea and to let us know if this is an issue. She is agreeable with the short term increase in potassium and aware I will forward a copy of her labs to Dr. [name] as well.  The patient was appreciative of the call back.

## 2021-01-13 NOTE — Telephone Encounter (Signed)
Arvil Chaco, PA-C  01/12/2021  6:45 PM EDT     Ongoing low potassium and magnesium.   Recommend:  --KCL tab 4mq BID x2 days  --After 2 days, drop back down to usual KCL tab 217m BID --Trial increase to magnesium '400mg'$  BID --If more frequent diarrhea occurs on BID dose of Mg (can be a side effect of the Mg), she should drop back down to her daily frequency. --Follow-up BMET/Mg labs with PCP as we had discussed (Will Cc)

## 2021-01-13 NOTE — Telephone Encounter (Signed)
Attempted to call the patient. No answer- unable to leave a message as the voice mail box is full.

## 2021-01-13 NOTE — Telephone Encounter (Signed)
Patient returning call.

## 2021-01-15 ENCOUNTER — Encounter (HOSPITAL_COMMUNITY): Payer: Self-pay | Admitting: Oncology

## 2021-01-16 ENCOUNTER — Inpatient Hospital Stay (HOSPITAL_BASED_OUTPATIENT_CLINIC_OR_DEPARTMENT_OTHER): Payer: PPO | Admitting: Oncology

## 2021-01-16 ENCOUNTER — Encounter: Payer: Self-pay | Admitting: Oncology

## 2021-01-16 ENCOUNTER — Inpatient Hospital Stay: Payer: PPO | Attending: Hospice and Palliative Medicine

## 2021-01-16 VITALS — BP 130/85 | HR 94 | Temp 96.0°F | Resp 16 | Wt 125.6 lb

## 2021-01-16 DIAGNOSIS — C9200 Acute myeloblastic leukemia, not having achieved remission: Secondary | ICD-10-CM

## 2021-01-16 DIAGNOSIS — D709 Neutropenia, unspecified: Secondary | ICD-10-CM | POA: Diagnosis not present

## 2021-01-16 DIAGNOSIS — Z85038 Personal history of other malignant neoplasm of large intestine: Secondary | ICD-10-CM | POA: Diagnosis not present

## 2021-01-16 DIAGNOSIS — Z923 Personal history of irradiation: Secondary | ICD-10-CM | POA: Diagnosis not present

## 2021-01-16 DIAGNOSIS — Z853 Personal history of malignant neoplasm of breast: Secondary | ICD-10-CM | POA: Diagnosis not present

## 2021-01-16 DIAGNOSIS — Z9221 Personal history of antineoplastic chemotherapy: Secondary | ICD-10-CM | POA: Insufficient documentation

## 2021-01-16 LAB — CBC WITH DIFFERENTIAL/PLATELET
Abs Immature Granulocytes: 0 10*3/uL (ref 0.00–0.07)
Basophils Absolute: 0 10*3/uL (ref 0.0–0.1)
Basophils Relative: 1 %
Eosinophils Absolute: 0 10*3/uL (ref 0.0–0.5)
Eosinophils Relative: 1 %
HCT: 42.8 % (ref 36.0–46.0)
Hemoglobin: 14.2 g/dL (ref 12.0–15.0)
Immature Granulocytes: 0 %
Lymphocytes Relative: 45 %
Lymphs Abs: 0.7 10*3/uL (ref 0.7–4.0)
MCH: 27.8 pg (ref 26.0–34.0)
MCHC: 33.2 g/dL (ref 30.0–36.0)
MCV: 83.8 fL (ref 80.0–100.0)
Monocytes Absolute: 0.2 10*3/uL (ref 0.1–1.0)
Monocytes Relative: 10 %
Neutro Abs: 0.7 10*3/uL — ABNORMAL LOW (ref 1.7–7.7)
Neutrophils Relative %: 43 %
Platelets: 182 10*3/uL (ref 150–400)
RBC: 5.11 MIL/uL (ref 3.87–5.11)
RDW: 15.1 % (ref 11.5–15.5)
WBC: 1.5 10*3/uL — ABNORMAL LOW (ref 4.0–10.5)
nRBC: 0 % (ref 0.0–0.2)

## 2021-01-16 LAB — COMPREHENSIVE METABOLIC PANEL
ALT: 31 U/L (ref 0–44)
AST: 16 U/L (ref 15–41)
Albumin: 4 g/dL (ref 3.5–5.0)
Alkaline Phosphatase: 122 U/L (ref 38–126)
Anion gap: 8 (ref 5–15)
BUN: 20 mg/dL (ref 8–23)
CO2: 26 mmol/L (ref 22–32)
Calcium: 9 mg/dL (ref 8.9–10.3)
Chloride: 108 mmol/L (ref 98–111)
Creatinine, Ser: 0.9 mg/dL (ref 0.44–1.00)
GFR, Estimated: >60 mL/min (ref >60)
Glucose, Bld: 103 mg/dL — ABNORMAL HIGH (ref 70–99)
Potassium: 4.4 mmol/L (ref 3.5–5.1)
Sodium: 142 mmol/L (ref 135–145)
Total Bilirubin: 0.8 mg/dL (ref 0.3–1.2)
Total Protein: 7 g/dL (ref 6.5–8.1)

## 2021-01-16 LAB — SAMPLE TO BLOOD BANK

## 2021-01-16 LAB — MAGNESIUM: Magnesium: 1.7 mg/dL (ref 1.7–2.4)

## 2021-01-16 NOTE — Progress Notes (Signed)
Patient reports feeling fatigued.

## 2021-01-17 ENCOUNTER — Encounter: Payer: Self-pay | Admitting: Oncology

## 2021-01-20 ENCOUNTER — Other Ambulatory Visit: Payer: Self-pay | Admitting: Oncology

## 2021-01-23 ENCOUNTER — Telehealth: Payer: Self-pay | Admitting: *Deleted

## 2021-01-23 NOTE — Telephone Encounter (Signed)
Patient called stating that her dentist Dr Auburn Bilberry wanted her to check with D rFinnegan about patient having a tooth extracted and also what antibiotics she should order for patient to be on for 7 days. Patient requests a return call to herself regarding this ASAP

## 2021-01-23 NOTE — Telephone Encounter (Signed)
Call returned to patient and informed of physician response, she then stated that dentist wanted [place] to fax to her. I gave pt our fax number and told her that Dentist needs to fax a request of exactly what they want to [place] then we will fax to her

## 2021-01-27 ENCOUNTER — Encounter: Payer: PPO | Admitting: Internal Medicine

## 2021-01-31 ENCOUNTER — Ambulatory Visit: Payer: PPO | Admitting: Internal Medicine

## 2021-01-31 ENCOUNTER — Telehealth: Payer: Self-pay | Admitting: Internal Medicine

## 2021-01-31 NOTE — Telephone Encounter (Signed)
Patient calling back in about her 01/31/21 12:00 appointment. Informed the Patient that due to weather our office will be closing this afternoon and doing virtual appointments. Offered to change Patient appointment to virtual.   Patient declined a virtual appointment and states she needs to be seen in person for evaluation due to her having Leukemia. States she was on the phone with a nurse and states the nurse was going to talk to Dr Nicki Reaper about it.   Please advise

## 2021-01-31 NOTE — Telephone Encounter (Signed)
Spoke with patient and advised that office is closing at 70. Patient stated that she needed to see Dr Nicki Reaper. Offered virtual appt today if she needed to be seen today or advised that we could move her appt to be seen face to face within the next few weeks. Patient says that it does not have to be today and she is not available to come in next week. Advised that I would take a look at Dr Lars Mage schedule and be back in touch with appt date and time.

## 2021-02-03 ENCOUNTER — Inpatient Hospital Stay: Payer: PPO

## 2021-02-03 ENCOUNTER — Inpatient Hospital Stay: Payer: PPO | Attending: Hospice and Palliative Medicine

## 2021-02-03 VITALS — BP 122/71 | HR 87 | Temp 97.0°F | Resp 18 | Wt 125.4 lb

## 2021-02-03 DIAGNOSIS — Z5111 Encounter for antineoplastic chemotherapy: Secondary | ICD-10-CM | POA: Diagnosis not present

## 2021-02-03 DIAGNOSIS — Z85038 Personal history of other malignant neoplasm of large intestine: Secondary | ICD-10-CM | POA: Insufficient documentation

## 2021-02-03 DIAGNOSIS — D709 Neutropenia, unspecified: Secondary | ICD-10-CM | POA: Insufficient documentation

## 2021-02-03 DIAGNOSIS — C9200 Acute myeloblastic leukemia, not having achieved remission: Secondary | ICD-10-CM

## 2021-02-03 DIAGNOSIS — D696 Thrombocytopenia, unspecified: Secondary | ICD-10-CM | POA: Insufficient documentation

## 2021-02-03 DIAGNOSIS — Z853 Personal history of malignant neoplasm of breast: Secondary | ICD-10-CM | POA: Diagnosis not present

## 2021-02-03 LAB — CBC WITH DIFFERENTIAL/PLATELET
Abs Immature Granulocytes: 0.01 10*3/uL (ref 0.00–0.07)
Basophils Absolute: 0 10*3/uL (ref 0.0–0.1)
Basophils Relative: 0 %
Eosinophils Absolute: 0 10*3/uL (ref 0.0–0.5)
Eosinophils Relative: 1 %
HCT: 41 % (ref 36.0–46.0)
Hemoglobin: 13.6 g/dL (ref 12.0–15.0)
Immature Granulocytes: 1 %
Lymphocytes Relative: 26 %
Lymphs Abs: 0.6 10*3/uL — ABNORMAL LOW (ref 0.7–4.0)
MCH: 28.2 pg (ref 26.0–34.0)
MCHC: 33.2 g/dL (ref 30.0–36.0)
MCV: 85.1 fL (ref 80.0–100.0)
Monocytes Absolute: 0.2 10*3/uL (ref 0.1–1.0)
Monocytes Relative: 10 %
Neutro Abs: 1.3 10*3/uL — ABNORMAL LOW (ref 1.7–7.7)
Neutrophils Relative %: 62 %
Platelets: 88 10*3/uL — ABNORMAL LOW (ref 150–400)
RBC: 4.82 MIL/uL (ref 3.87–5.11)
RDW: 15.1 % (ref 11.5–15.5)
WBC: 2.2 10*3/uL — ABNORMAL LOW (ref 4.0–10.5)
nRBC: 0 % (ref 0.0–0.2)

## 2021-02-03 LAB — COMPREHENSIVE METABOLIC PANEL
ALT: 31 U/L (ref 0–44)
AST: 16 U/L (ref 15–41)
Albumin: 3.8 g/dL (ref 3.5–5.0)
Alkaline Phosphatase: 104 U/L (ref 38–126)
Anion gap: 7 (ref 5–15)
BUN: 19 mg/dL (ref 8–23)
CO2: 26 mmol/L (ref 22–32)
Calcium: 9 mg/dL (ref 8.9–10.3)
Chloride: 107 mmol/L (ref 98–111)
Creatinine, Ser: 0.98 mg/dL (ref 0.44–1.00)
GFR, Estimated: >60 mL/min (ref >60)
Glucose, Bld: 105 mg/dL — ABNORMAL HIGH (ref 70–99)
Potassium: 3.8 mmol/L (ref 3.5–5.1)
Sodium: 140 mmol/L (ref 135–145)
Total Bilirubin: 0.9 mg/dL (ref 0.3–1.2)
Total Protein: 6.7 g/dL (ref 6.5–8.1)

## 2021-02-03 LAB — MAGNESIUM: Magnesium: 1.5 mg/dL — ABNORMAL LOW (ref 1.7–2.4)

## 2021-02-03 LAB — SAMPLE TO BLOOD BANK

## 2021-02-03 MED ORDER — AZACITIDINE CHEMO SQ INJECTION
50.0000 mg/m2 | Freq: Once | INTRAMUSCULAR | Status: AC
  Administered 2021-02-03: 87.5 mg via SUBCUTANEOUS
  Filled 2021-02-03: qty 3.5

## 2021-02-03 MED ORDER — ONDANSETRON HCL 4 MG PO TABS
8.0000 mg | ORAL_TABLET | Freq: Once | ORAL | Status: AC
  Administered 2021-02-03: 8 mg via ORAL
  Filled 2021-02-03: qty 2

## 2021-02-03 NOTE — Progress Notes (Signed)
[facility]  [phone] [phone]  ID: Debra Hale OB: 1950/10/21  MR#: [id number]  SVX#:[id number]  Patient Care Team: [name], MD as PCP - General (Internal Medicine) Wellington Hampshire, MD as PCP - Cardiology (Cardiology)  CHIEF COMPLAINT: AML.  INTERVAL HISTORY: Patient returns to clinic today for further evaluation and continuation of maintenance Vidaza.  She continues to feel well and remains asymptomatic.  She does not complain of any weakness or fatigue today.  She denies any pain.  She has no neurologic complaints.  She denies any recent fevers or illnesses. She has no chest pain, shortness of breath, cough, or hemoptysis.  She denies any nausea, vomiting, constipation, or diarrhea.  She has no urinary complaints.  Patient offers no specific complaints today.  REVIEW OF SYSTEMS:   Review of Systems  Constitutional: Negative.  Negative for fever, malaise/fatigue and weight loss.  Respiratory: Negative.  Negative for cough, hemoptysis and shortness of breath.   Cardiovascular: Negative.  Negative for chest pain and leg swelling.  Gastrointestinal: Negative.  Negative for abdominal pain and diarrhea.  Genitourinary: Negative.  Negative for dysuria.  Musculoskeletal: Negative.  Negative for back pain.  Skin: Negative.  Negative for rash.  Neurological: Negative.  Negative for dizziness, focal weakness, weakness and headaches.  Psychiatric/Behavioral: Negative.  Negative for memory loss. The patient is not nervous/anxious.    As per HPI. Otherwise, a complete review of systems is negative.  PAST MEDICAL HISTORY: Past Medical History:  Diagnosis Date   Anxiety    Arthritis    Osteoarthritis   Breast cancer ([place]) 2009   right breast lumpectomy with rad tx   Colon cancer ([place])    surgery with chemo and rad tx   Complication of anesthesia    GERD (gastroesophageal reflux disease)    Heart palpitations    History of hiatal  hernia    History of kidney stones    Hypothyroidism    Liver disease    Liver nodule    s/p negative biopsy   Malignant neoplasm of thyroid gland ([place]) 2002   s/p surgery and XRT   Osteoporosis    Other and unspecified hyperlipidemia    Palpitations    Personal history of chemotherapy    Personal history of malignant neoplasm of large intestine    carcinoma - cecum, s/p right laparoscopic colectomy - s/p chemotherapy and XRT   Personal history of radiation therapy    Pneumonia 2019   PONV (postoperative nausea and vomiting)    Pure hypercholesterolemia    Unspecified hereditary and idiopathic peripheral neuropathy     PAST SURGICAL HISTORY: Past Surgical History:  Procedure Laterality Date   APPENDECTOMY  1985   BREAST BIOPSY Right 2009   positive   BREAST BIOPSY Right 2009   negative   BREAST LUMPECTOMY Right 2009   breast cancer   CHOLECYSTECTOMY  1995   COLONOSCOPY     COLONOSCOPY WITH PROPOFOL N/A 10/29/2017   Procedure: COLONOSCOPY WITH PROPOFOL;  Surgeon: Manya Silvas, MD;  Location: [facility] ENDOSCOPY;  Service: Endoscopy;  Laterality: N/A;   DILATION AND CURETTAGE OF UTERUS  1990   DILATION AND CURETTAGE, DIAGNOSTIC / THERAPEUTIC  1990   ESOPHAGOGASTRODUODENOSCOPY     ESOPHAGOGASTRODUODENOSCOPY (EGD) WITH PROPOFOL N/A 10/29/2017   Procedure: ESOPHAGOGASTRODUODENOSCOPY (EGD) WITH PROPOFOL;  Surgeon: Manya Silvas, MD;  Location: [facility] ENDOSCOPY;  Service: Endoscopy;  Laterality: N/A;   INCISION AND DRAINAGE PERIRECTAL ABSCESS N/A 06/17/2020   Procedure: IRRIGATION  AND DEBRIDEMENT PERIRECTAL ABSCESS;  Surgeon: Jules Husbands, MD;  Location: [facility] ORS;  Service: General;  Laterality: N/A;   LAPAROSCOPIC PARTIAL COLECTOMY     stage 3-C carcinoma of the cecum, s/p chemotherapy and xrt   LITHOTRIPSY     SIGMOIDOSCOPY  08/26/1993   THYROID LOBECTOMY  2002   s/p XRT   TOTAL HIP ARTHROPLASTY Right 10/24/2019   Procedure: TOTAL HIP ARTHROPLASTY;  Surgeon: Corky Mull,  MD;  Location: [facility] ORS;  Service: Orthopedics;  Laterality: Right;    FAMILY HISTORY: Family History  Problem Relation Age of Onset   Stroke Mother        62s   Alzheimer's disease Mother    Cancer Mother    Lung cancer Father    Prostate cancer Father    Cancer Father        Colon   Colon cancer Father    Breast cancer Sister        36's   Lung cancer Sister    Breast cancer Maternal Aunt     ADVANCED DIRECTIVES (Y/N):  N  HEALTH MAINTENANCE: Social History   Tobacco Use   Smoking status: Never   Smokeless tobacco: Never  Vaping Use   Vaping Use: Never used  Substance Use Topics   Alcohol use: No    Alcohol/week: 0.0 standard drinks   Drug use: No     Colonoscopy:  PAP:  Bone density:  Lipid panel:  Allergies  Allergen Reactions   Demeclocycline Other (See Comments)    Throat swells   Sulfa Antibiotics Other (See Comments)    Other reaction(s): Other (See Comments) Throat swells Other reaction(s): Other (See Comments) Throat swells   Tetracyclines & Related Other (See Comments)    Throat swells    Augmentin [Amoxicillin-Pot Clavulanate] Diarrhea   Bentyl [Dicyclomine Hcl]     unkn   Ciprofloxacin Diarrhea   Codeine Other (See Comments)    dizziness     Dicyclomine Hcl Other (See Comments)    unkn   Epinephrine     Speeds heart up - panic    Flagyl [Metronidazole] Nausea And Vomiting   Librax [Chlordiazepoxide-Clidinium]     unkn   Novocain [Procaine] Other (See Comments)    "Shaky"   Phenobarbital     unkn   Prednisone     Nervous     Ultram [Tramadol] Other (See Comments)    Sick feeling    Current Outpatient Medications  Medication Sig Dispense Refill   Azelastine-Fluticasone 137-50 MCG/ACT SUSP Place 2 sprays into both nostrils daily.     clonazePAM (KLONOPIN) 0.5 MG tablet Take 1.5 mg by mouth 2 (two) times daily.   0   diphenoxylate-atropine (LOMOTIL) 2.5-0.025 MG tablet Take 1 tablet by mouth 4 (four) times daily as needed  for diarrhea or loose stools. 120 tablet 1   escitalopram (LEXAPRO) 20 MG tablet Take 20 mg by mouth every morning.      levothyroxine (SYNTHROID) 125 MCG tablet Take 125 mcg by mouth daily before breakfast.     loperamide (IMODIUM) 2 MG capsule Take by mouth.     magnesium oxide (MAG-OX) 400 MG tablet Take 1 tablet (400 mg total) by mouth daily. 30 tablet 0   metoprolol tartrate (LOPRESSOR) 25 MG tablet TAKE 1 TABLET (25 MG TOTAL) BY MOUTH AS NEEDED (AS NEEDED UP TO TWICE DAILY FOR PALPITATIONS). 60 tablet 1   pantoprazole (PROTONIX) 40 MG tablet TAKE ONE TABLET (40 MG) BY MOUTH EVERY  DAY 90 tablet 2   potassium chloride SA (KLOR-CON) 20 MEQ tablet Take 1 tablet (20 mEq total) by mouth 2 (two) times daily. 60 tablet 0   valACYclovir (VALTREX) 500 MG tablet Take 1 tablet by mouth daily.     venetoclax (VENCLEXTA) 100 MG tablet Take 400 mg by mouth daily. Take for 14 days, then hold for 14 days. Repeat every 28 days. Take with a meal and water. (Patient not taking: Reported on 02/04/2021)     Current Facility-Administered Medications  Medication Dose Route Frequency Provider Last Rate Last Admin   ondansetron (ZOFRAN) tablet 8 mg  8 mg Oral Once Guse, Jacquelynn Cree, FNP       Facility-Administered Medications Ordered in Other Visits  Medication Dose Route Frequency Provider Last Rate Last Admin   potassium chloride (KLOR-CON) CR tablet 40 mEq  40 mEq Oral BID Lloyd Huger, MD   40 mEq at 07/08/20 1136    OBJECTIVE: Vitals:   02/04/21 1342  BP: 123/78  Pulse: 84  Resp: 16  Temp: 97.9 F (36.6 C)  SpO2: 99%     Body mass index is 21.87 kg/m.    ECOG FS:0 - Asymptomatic  General: Well-developed, well-nourished, no acute distress. Eyes: Pink conjunctiva, anicteric sclera. HEENT: Normocephalic, moist mucous membranes. Lungs: No audible wheezing or coughing. Heart: Regular rate and rhythm. Abdomen: Soft, nontender, no obvious distention. Musculoskeletal: No edema, cyanosis, or  clubbing. Neuro: Alert, answering all questions appropriately. Cranial nerves grossly intact. Skin: No rashes or petechiae noted. Psych: Normal affect.  LAB RESULTS:  Lab Results  Component Value Date   NA 140 02/03/2021   K 3.8 02/03/2021   CL 107 02/03/2021   CO2 26 02/03/2021   GLUCOSE 105 (H) 02/03/2021   BUN 19 02/03/2021   CREATININE 0.98 02/03/2021   CALCIUM 9.0 02/03/2021   PROT 6.7 02/03/2021   ALBUMIN 3.8 02/03/2021   AST 16 02/03/2021   ALT 31 02/03/2021   ALKPHOS 104 02/03/2021   BILITOT 0.9 02/03/2021   GFRNONAA >60 02/03/2021   GFRAA 54 (L) 10/26/2019    Lab Results  Component Value Date   WBC 2.2 (L) 02/03/2021   NEUTROABS 1.3 (L) 02/03/2021   HGB 13.6 02/03/2021   HCT 41.0 02/03/2021   MCV 85.1 02/03/2021   PLT 88 (L) 02/03/2021     STUDIES: No results found.   ASSESSMENT: AML.  PLAN:    1.  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  Her peripheral blood flow cytometry also revealed 6% aberrant myeloblasts.  Patient completed cycle 1 of induction therapy at [facility] with Vidaza and venetoclax.  She only received 5 days of Vidaza for cycle 2 recently which was interrupted secondary to significant diarrhea and a perirectal abscess.  Repeat bone marrow biopsy on January 02, 2021 did not reveal any overt disease, but cytogenetics remained positive likely indicating a very good partial remission.  Patient has been instructed to discontinue venetoclax.  Will continue maintenance treatment with Vidaza on days 1 through 5 every 28 days.  Proceed with cycle 9 of treatment today.  Return to clinic daily for the next 3 days to complete cycle.  Patient will then return to clinic in 2 weeks for laboratory work only and then in 4 weeks for further evaluation and consideration of cycle 10. 2.  Neutropenia: Chronic and unchanged.  Bone marrow biopsy as above. 3.  Thrombocytopenia: Patient's platelet count is 88.  Discontinue venetoclax as above.   4.  Perirectal abscess:  Resolved.   5.  Diarrhea: Patient does not complain of this today.  Continue Lomotil as needed. 6.  Supportive care: Patient has been given referrals to home health and home palliative care.  Appreciate palliative care input.   7. 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  Patient also received chemotherapy and likely received Adriamycin, but exact regimen is unknown.  She completed 5 years of hormonal therapy in approximately June 2015. Currently, she has no evidence of disease.  Her most recent mammogram on March 05, 2020 was reported as BI-RADS 1.  Repeat in November 2022. 8.  History of colon cancer: Patient also states that she received chemotherapy for this, possibly FOLFOX but again this is unknown. 9.  Hypokalemia: Resolved. 10.  Hypomagnesia: Magnesium is 1.5 today.  Continue oral magnesium supplementation as prescribed.  Patient expressed understanding and was in agreement with this plan. She also understands that She can call clinic at any time with any questions, concerns, or complaints.   Cancer Staging History of breast cancer Staging form: Breast, AJCC 7th Edition - Clinical stage from 01/07/2016: Stage IA (T1c, N0, M0) - Signed by Lloyd Huger, MD on 01/07/2016 Laterality: Right Estrogen receptor status: Positive Progesterone receptor status: Positive HER2 status: Negative   Lloyd Huger, MD   02/05/2021 11:55 AM

## 2021-02-03 NOTE — Progress Notes (Signed)
Pt states she received her Venetoclax from [facility] and is inquiring if she is supposed to start. Per Dr [name]- continue to hold.  Pt aware and verbalized understanding.

## 2021-02-04 ENCOUNTER — Inpatient Hospital Stay (HOSPITAL_BASED_OUTPATIENT_CLINIC_OR_DEPARTMENT_OTHER): Payer: PPO | Admitting: Oncology

## 2021-02-04 ENCOUNTER — Ambulatory Visit: Payer: PPO

## 2021-02-04 ENCOUNTER — Other Ambulatory Visit: Payer: PPO

## 2021-02-04 ENCOUNTER — Inpatient Hospital Stay: Payer: PPO

## 2021-02-04 ENCOUNTER — Other Ambulatory Visit: Payer: Self-pay

## 2021-02-04 VITALS — BP 123/78 | HR 84 | Temp 97.9°F | Resp 16 | Wt 127.4 lb

## 2021-02-04 DIAGNOSIS — C9200 Acute myeloblastic leukemia, not having achieved remission: Secondary | ICD-10-CM

## 2021-02-04 DIAGNOSIS — Z5111 Encounter for antineoplastic chemotherapy: Secondary | ICD-10-CM | POA: Diagnosis not present

## 2021-02-04 MED ORDER — AZACITIDINE CHEMO SQ INJECTION
50.0000 mg/m2 | Freq: Once | INTRAMUSCULAR | Status: AC
  Administered 2021-02-04: 87.5 mg via SUBCUTANEOUS
  Filled 2021-02-04: qty 3.5

## 2021-02-04 MED ORDER — ONDANSETRON HCL 4 MG PO TABS
8.0000 mg | ORAL_TABLET | Freq: Once | ORAL | Status: AC
  Administered 2021-02-04: 8 mg via ORAL
  Filled 2021-02-04: qty 2

## 2021-02-04 NOTE — Progress Notes (Signed)
Pt has questions about why she was advised to stop her venetoclax and not restart it. Pt endorses intermittent numbness/pain to right hand.

## 2021-02-04 NOTE — Progress Notes (Signed)
Nutrition Follow-up:   Patient with AML and receiving vidaza.  Met with patient during infusion.  Patient reports that she has had a good appetite and eating everything in site.  Says that she has eaten an egg mcmuffin and water today so far.  Ate a 6inch ham and cheese sub with lettuce tomato last night for dinner.  Says that at lunch time she often is not very hungry. Does not like the oral nutrition supplements.      Medications: reviewed  Labs: reviewed  Anthropometrics:   Weight 127 lb today increased 126 lb on 7/19 128 lb 8 oz on 6/21   NUTRITION DIAGNOSIS: Inadequate oral intake improved    INTERVENTION:  Discussed making milkshakes with patient for added nutrition as does not like oral nutrition supplements Encouraged snack at mid day if possible (peanut butter crackers, 1/2 sandwich, yogurt)    MONITORING, EVALUATION, GOAL: Weight trends, intake   NEXT VISIT: Wednesday, Nov 3rd during infusion   B. Zenia Resides, [place], [place] Registered Dietitian [phone] (mobile)

## 2021-02-05 ENCOUNTER — Encounter: Payer: Self-pay | Admitting: Oncology

## 2021-02-05 ENCOUNTER — Inpatient Hospital Stay: Payer: PPO

## 2021-02-05 VITALS — BP 132/73 | HR 84 | Temp 97.0°F | Resp 19

## 2021-02-05 DIAGNOSIS — C9200 Acute myeloblastic leukemia, not having achieved remission: Secondary | ICD-10-CM

## 2021-02-05 DIAGNOSIS — Z5111 Encounter for antineoplastic chemotherapy: Secondary | ICD-10-CM | POA: Diagnosis not present

## 2021-02-05 MED ORDER — ONDANSETRON HCL 4 MG PO TABS
8.0000 mg | ORAL_TABLET | Freq: Once | ORAL | Status: AC
  Administered 2021-02-05: 8 mg via ORAL
  Filled 2021-02-05: qty 2

## 2021-02-05 MED ORDER — AZACITIDINE CHEMO SQ INJECTION
50.0000 mg/m2 | Freq: Once | INTRAMUSCULAR | Status: AC
  Administered 2021-02-05: 87.5 mg via SUBCUTANEOUS
  Filled 2021-02-05: qty 3.5

## 2021-02-06 ENCOUNTER — Inpatient Hospital Stay: Payer: PPO

## 2021-02-06 VITALS — BP 132/78 | HR 89 | Temp 96.9°F | Resp 18

## 2021-02-06 DIAGNOSIS — Z5111 Encounter for antineoplastic chemotherapy: Secondary | ICD-10-CM | POA: Diagnosis not present

## 2021-02-06 DIAGNOSIS — C9200 Acute myeloblastic leukemia, not having achieved remission: Secondary | ICD-10-CM

## 2021-02-06 MED ORDER — ONDANSETRON HCL 4 MG PO TABS
8.0000 mg | ORAL_TABLET | Freq: Once | ORAL | Status: AC
  Administered 2021-02-06: 8 mg via ORAL
  Filled 2021-02-06: qty 2

## 2021-02-06 MED ORDER — AZACITIDINE CHEMO SQ INJECTION
50.0000 mg/m2 | Freq: Once | INTRAMUSCULAR | Status: AC
  Administered 2021-02-06: 87.5 mg via SUBCUTANEOUS
  Filled 2021-02-06: qty 3.5

## 2021-02-06 NOTE — Patient Instructions (Signed)
[facility]COL[facility]   Discharge Instructions: Thank you for choosing [facility] to provide your oncology and hematology care.  If you have a lab appointment with the [facility], please go directly to the [facility] and check in at the registration area.  We strive to give you quality time with your provider. You may need to reschedule your appointment if you arrive late (15 or more minutes).  Arriving late affects you and other patients whose appointments are after yours.  Also, if you miss three or more appointments without notifying the office, you may be dismissed from the clinic at the provider's discretion.      For prescription refill requests, have your pharmacy contact our office and allow 72 hours for refills to be completed.    Today you received the following chemotherapy and/or immunotherapy agents: Vidaza.      To help prevent nausea and vomiting after your treatment, we encourage you to take your nausea medication as directed.  BELOW ARE SYMPTOMS THAT SHOULD BE REPORTED IMMEDIATELY: *FEVER GREATER THAN 100.4 F (38 C) OR HIGHER *CHILLS OR SWEATING *NAUSEA AND VOMITING THAT IS NOT CONTROLLED WITH YOUR NAUSEA MEDICATION *UNUSUAL SHORTNESS OF BREATH *UNUSUAL BRUISING OR BLEEDING *URINARY PROBLEMS (pain or burning when urinating, or frequent urination) *BOWEL PROBLEMS (unusual diarrhea, constipation, pain near the anus) TENDERNESS IN MOUTH AND THROAT WITH OR WITHOUT PRESENCE OF ULCERS (sore throat, sores in mouth, or a toothache) UNUSUAL RASH, SWELLING OR PAIN  UNUSUAL VAGINAL DISCHARGE OR ITCHING   Items with * indicate a potential emergency and should be followed up as soon as possible or go to the Emergency Department if any problems should occur.  Please show the CHEMOTHERAPY ALERT CARD or IMMUNOTHERAPY ALERT CARD at check-in to the Emergency Department and triage nurse.  Should you have questions after your visit or need to  cancel or reschedule your appointment, please contact [facility]  [phone] and follow the prompts.  Office hours are 8:00 a.m. to 4:30 p.m. Monday - Friday. Please note that voicemails left after 4:00 p.m. may not be returned until the following business day.  We are closed weekends and major holidays. You have access to a nurse at all times for urgent questions. Please call the main number to the clinic [phone] and follow the prompts.  For any non-urgent questions, you may also contact your provider using MyChart. We now offer e-Visits for anyone 39 and older to request care online for non-urgent symptoms. For details visit [email or web address].   Also download the MyChart app! Go to the app store, search "MyChart", open the app, select [facility], and log in with your MyChart username and password.  Due to Covid, a mask is required upon entering the hospital/clinic. If you do not have a mask, one will be given to you upon arrival. For doctor visits, patients may have 1 support person aged 74 or older with them. For treatment visits, patients cannot have anyone with them due to current Covid guidelines and our immunocompromised population.

## 2021-02-07 ENCOUNTER — Other Ambulatory Visit: Payer: Self-pay | Admitting: Nurse Practitioner

## 2021-02-07 ENCOUNTER — Inpatient Hospital Stay: Payer: PPO

## 2021-02-07 VITALS — BP 129/73 | HR 71 | Temp 96.0°F | Resp 18

## 2021-02-07 DIAGNOSIS — C9200 Acute myeloblastic leukemia, not having achieved remission: Secondary | ICD-10-CM

## 2021-02-07 DIAGNOSIS — Z5111 Encounter for antineoplastic chemotherapy: Secondary | ICD-10-CM | POA: Diagnosis not present

## 2021-02-07 MED ORDER — PROCHLORPERAZINE MALEATE 10 MG PO TABS: 10.0000 mg | ORAL_TABLET | Freq: Four times a day (QID) | ORAL | 0 refills | Status: DC | PRN

## 2021-02-07 MED ORDER — DIPHENOXYLATE-ATROPINE 2.5-0.025 MG PO TABS: 1.0000 | ORAL_TABLET | Freq: Four times a day (QID) | ORAL | 1 refills | Status: DC | PRN

## 2021-02-07 MED ORDER — AZACITIDINE CHEMO SQ INJECTION
50.0000 mg/m2 | Freq: Once | INTRAMUSCULAR | Status: AC
  Administered 2021-02-07: 87.5 mg via SUBCUTANEOUS
  Filled 2021-02-07: qty 3.5

## 2021-02-07 MED ORDER — PROCHLORPERAZINE MALEATE 10 MG PO TABS
10.0000 mg | ORAL_TABLET | Freq: Four times a day (QID) | ORAL | Status: DC | PRN
  Administered 2021-02-07: 10 mg via ORAL
  Filled 2021-02-07: qty 1

## 2021-02-07 NOTE — Telephone Encounter (Signed)
Appt has been rescheduled

## 2021-02-07 NOTE — Progress Notes (Signed)
Pt reports that she has been "sick on my stomach" and experiencing diarrhea since last night that is "much more intense than normal". Pt also reports taking imodium  this morning with some relief. Pt is here for Vidaza injections. Vitals stable.  NP notified. Per NP to continue with Vidaza injections and to place an order for Compazine 10 mg PO prior to injections. Per NP she will send a prescription for compazine 10 mg PO to patient's pharmacy for home use. Pt updated and all questions answered at this time. RN educated pt on the importance of notifying the clinic if any complications occur at home, pt verbalized understanding and all questions answered at this time. Pt stable for discharge.   [employer]

## 2021-02-13 ENCOUNTER — Ambulatory Visit (INDEPENDENT_AMBULATORY_CARE_PROVIDER_SITE_OTHER): Payer: PPO | Admitting: Internal Medicine

## 2021-02-13 ENCOUNTER — Encounter: Payer: Self-pay | Admitting: Internal Medicine

## 2021-02-13 ENCOUNTER — Other Ambulatory Visit: Payer: Self-pay

## 2021-02-13 VITALS — BP 120/70 | HR 81 | Temp 97.9°F | Resp 16 | Ht 64.0 in | Wt 127.8 lb

## 2021-02-13 DIAGNOSIS — R002 Palpitations: Secondary | ICD-10-CM | POA: Diagnosis not present

## 2021-02-13 DIAGNOSIS — Z0000 Encounter for general adult medical examination without abnormal findings: Secondary | ICD-10-CM

## 2021-02-13 DIAGNOSIS — C9200 Acute myeloblastic leukemia, not having achieved remission: Secondary | ICD-10-CM

## 2021-02-13 DIAGNOSIS — G620 Drug-induced polyneuropathy: Secondary | ICD-10-CM | POA: Diagnosis not present

## 2021-02-13 DIAGNOSIS — Z85038 Personal history of other malignant neoplasm of large intestine: Secondary | ICD-10-CM

## 2021-02-13 DIAGNOSIS — F32A Depression, unspecified: Secondary | ICD-10-CM | POA: Diagnosis not present

## 2021-02-13 DIAGNOSIS — F439 Reaction to severe stress, unspecified: Secondary | ICD-10-CM | POA: Diagnosis not present

## 2021-02-13 DIAGNOSIS — E7800 Pure hypercholesterolemia, unspecified: Secondary | ICD-10-CM

## 2021-02-13 DIAGNOSIS — E559 Vitamin D deficiency, unspecified: Secondary | ICD-10-CM | POA: Diagnosis not present

## 2021-02-13 DIAGNOSIS — K219 Gastro-esophageal reflux disease without esophagitis: Secondary | ICD-10-CM

## 2021-02-13 DIAGNOSIS — Z853 Personal history of malignant neoplasm of breast: Secondary | ICD-10-CM

## 2021-02-13 DIAGNOSIS — F419 Anxiety disorder, unspecified: Secondary | ICD-10-CM

## 2021-02-13 DIAGNOSIS — E039 Hypothyroidism, unspecified: Secondary | ICD-10-CM | POA: Diagnosis not present

## 2021-02-13 DIAGNOSIS — T451X5A Adverse effect of antineoplastic and immunosuppressive drugs, initial encounter: Secondary | ICD-10-CM

## 2021-02-13 DIAGNOSIS — Z85850 Personal history of malignant neoplasm of thyroid: Secondary | ICD-10-CM | POA: Diagnosis not present

## 2021-02-13 DIAGNOSIS — D72819 Decreased white blood cell count, unspecified: Secondary | ICD-10-CM

## 2021-02-13 NOTE — Assessment & Plan Note (Addendum)
Physical today 02/13/21.  Mammogram 03/07/20 - Birads I.  Refer back to GI as outlined.

## 2021-02-13 NOTE — Progress Notes (Signed)
Patient ID: Debra Hale, female   DOB: 01/14/1951, 70 y.o.   MRN: [id number]   Subjective:    Patient ID: Debra Hale, female    DOB: 04/28/1951, 70 y.o.   MRN: [id number]  This visit occurred during the SARS-CoV-2 public health emergency.  Safety protocols were in place, including screening questions prior to the visit, additional usage of staff PPE, and extensive cleaning of exam room while observing appropriate contact time as indicated for disinfecting solutions.   Patient here for her physical.   .   HPI She is followed by hematology (Dr [name]) for f/u AML.  Receiving treatment (Vidaza).  Increased stress related to her medical issues and treatment.  Discussed.  Seeing Dr Clovis Riley.  Relatively stable on current medication regimen.  Also seeing cardiology - f/u palpitations.  No chest pain.  Breathing stable.  Palpitations may be more triggered by stress.  Saw cardiology 01/2021.  Felt stable.  Has lopressor if needed.     Past Medical History:  Diagnosis Date   Anxiety    Arthritis    Osteoarthritis   Breast cancer ([place]) 2009   right breast lumpectomy with rad tx   Colon cancer ([place])    surgery with chemo and rad tx   Complication of anesthesia    GERD (gastroesophageal reflux disease)    Heart palpitations    History of hiatal hernia    History of kidney stones    Hypothyroidism    Liver disease    Liver nodule    s/p negative biopsy   Malignant neoplasm of thyroid gland ([place]) 2002   s/p surgery and XRT   Osteoporosis    Other and unspecified hyperlipidemia    Palpitations    Personal history of chemotherapy    Personal history of malignant neoplasm of large intestine    carcinoma - cecum, s/p right laparoscopic colectomy - s/p chemotherapy and XRT   Personal history of radiation therapy    Pneumonia 2019   PONV (postoperative nausea and vomiting)    Pure hypercholesterolemia    Unspecified hereditary and idiopathic peripheral neuropathy    Past Surgical  History:  Procedure Laterality Date   APPENDECTOMY  1985   BREAST BIOPSY Right 2009   positive   BREAST BIOPSY Right 2009   negative   BREAST LUMPECTOMY Right 2009   breast cancer   CHOLECYSTECTOMY  1995   COLONOSCOPY     COLONOSCOPY WITH PROPOFOL N/A 10/29/2017   Procedure: COLONOSCOPY WITH PROPOFOL;  Surgeon: Manya Silvas, MD;  Location: [facility] ENDOSCOPY;  Service: Endoscopy;  Laterality: N/A;   DILATION AND CURETTAGE OF UTERUS  1990   DILATION AND CURETTAGE, DIAGNOSTIC / THERAPEUTIC  1990   ESOPHAGOGASTRODUODENOSCOPY     ESOPHAGOGASTRODUODENOSCOPY (EGD) WITH PROPOFOL N/A 10/29/2017   Procedure: ESOPHAGOGASTRODUODENOSCOPY (EGD) WITH PROPOFOL;  Surgeon: Manya Silvas, MD;  Location: [facility] ENDOSCOPY;  Service: Endoscopy;  Laterality: N/A;   INCISION AND DRAINAGE PERIRECTAL ABSCESS N/A 06/17/2020   Procedure: IRRIGATION AND DEBRIDEMENT PERIRECTAL ABSCESS;  Surgeon: Jules Husbands, MD;  Location: [facility] ORS;  Service: General;  Laterality: N/A;   LAPAROSCOPIC PARTIAL COLECTOMY     stage 3-C carcinoma of the cecum, s/p chemotherapy and xrt   LITHOTRIPSY     SIGMOIDOSCOPY  08/26/1993   THYROID LOBECTOMY  2002   s/p XRT   TOTAL HIP ARTHROPLASTY Right 10/24/2019   Procedure: TOTAL HIP ARTHROPLASTY;  Surgeon: Corky Mull, MD;  Location: [facility] ORS;  Service: Orthopedics;  Laterality:  Right;   Family History  Problem Relation Age of Onset   Stroke Mother        9s   Alzheimer's disease Mother    Cancer Mother    Lung cancer Father    Prostate cancer Father    Cancer Father        Colon   Colon cancer Father    Breast cancer Sister        69's   Lung cancer Sister    Breast cancer Maternal Aunt    Social History   Socioeconomic History   Marital status: Single    Spouse name: Not on file   Number of children: 0   Years of education: Not on file   Highest education level: Not on file  Occupational History   Not on file  Tobacco Use   Smoking status: Never   Smokeless  tobacco: Never  Vaping Use   Vaping Use: Never used  Substance and Sexual Activity   Alcohol use: No    Alcohol/week: 0.0 standard drinks   Drug use: No   Sexual activity: Not Currently  Other Topics Concern   Not on file  Social History Narrative   Not on file   Social Determinants of Health   Financial Resource Strain: Not on file  Food Insecurity: Not on file  Transportation Needs: Not on file  Physical Activity: Not on file  Stress: Not on file  Social Connections: Not on file     Review of Systems  Constitutional:  Negative for appetite change and unexpected weight change.  HENT:  Negative for congestion, sinus pressure and sore throat.   Eyes:  Negative for pain and visual disturbance.  Respiratory:  Negative for cough, chest tightness and shortness of breath.   Cardiovascular:  Negative for chest pain and leg swelling.       Palpitations - overall appear to be improved.  Follow.   Gastrointestinal:  Positive for diarrhea. Negative for abdominal pain, nausea and vomiting.  Genitourinary:  Negative for difficulty urinating and dysuria.  Musculoskeletal:  Negative for joint swelling and myalgias.  Skin:  Negative for color change and rash.  Neurological:  Negative for dizziness, light-headedness and headaches.  Hematological:  Negative for adenopathy. Does not bruise/bleed easily.  Psychiatric/Behavioral:  Negative for agitation and dysphoric mood.       Objective:     BP 120/70   Pulse 81   Temp 97.9 F (36.6 C)   Resp 16   Ht 5\' 4"  (1.626 m)   Wt 127 lb 12.8 oz (58 kg)   SpO2 98%   BMI 21.94 kg/m  Wt Readings from Last 3 Encounters:  02/13/21 127 lb 12.8 oz (58 kg)  02/04/21 127 lb 6.4 oz (57.8 kg)  02/03/21 125 lb 6 oz (56.9 kg)    Physical Exam Vitals reviewed.  Constitutional:      General: She is not in acute distress.    Appearance: Normal appearance. She is well-developed.  HENT:     Head: Normocephalic and atraumatic.     Right Ear:  External ear normal.     Left Ear: External ear normal.  Eyes:     General: No scleral icterus.       Right eye: No discharge.        Left eye: No discharge.     Conjunctiva/sclera: Conjunctivae normal.  Neck:     Thyroid: No thyromegaly.  Cardiovascular:     Rate and Rhythm: Normal rate  and regular rhythm.  Pulmonary:     Effort: No tachypnea, accessory muscle usage or respiratory distress.     Breath sounds: Normal breath sounds. No decreased breath sounds or wheezing.  Chest:  Breasts:    Right: No inverted nipple, mass, nipple discharge or tenderness (no axillary adenopathy).     Left: No inverted nipple, mass, nipple discharge or tenderness (no axilarry adenopathy).  Abdominal:     General: Bowel sounds are normal.     Palpations: Abdomen is soft.     Tenderness: There is no abdominal tenderness.  Musculoskeletal:        General: No swelling or tenderness.     Cervical back: Neck supple.  Lymphadenopathy:     Cervical: No cervical adenopathy.  Skin:    Findings: No erythema or rash.  Neurological:     Mental Status: She is alert and oriented to person, place, and time.  Psychiatric:        Mood and Affect: Mood normal.        Behavior: Behavior normal.     Outpatient Encounter Medications as of 02/13/2021  Medication Sig   Azelastine-Fluticasone 137-50 MCG/ACT SUSP Place 2 sprays into both nostrils daily.   clonazePAM (KLONOPIN) 0.5 MG tablet Take 1.5 mg by mouth 2 (two) times daily.    diphenoxylate-atropine (LOMOTIL) 2.5-0.025 MG tablet Take 1 tablet by mouth 4 (four) times daily as needed for diarrhea or loose stools.   escitalopram (LEXAPRO) 20 MG tablet Take 20 mg by mouth every morning.    levothyroxine (SYNTHROID) 125 MCG tablet Take 125 mcg by mouth daily before breakfast.   loperamide (IMODIUM) 2 MG capsule Take by mouth.   magnesium oxide (MAG-OX) 400 MG tablet Take 1 tablet (400 mg total) by mouth daily.   metoprolol tartrate (LOPRESSOR) 25 MG tablet TAKE  1 TABLET (25 MG TOTAL) BY MOUTH AS NEEDED (AS NEEDED UP TO TWICE DAILY FOR PALPITATIONS).   pantoprazole (PROTONIX) 40 MG tablet TAKE ONE TABLET (40 MG) BY MOUTH EVERY DAY   potassium chloride SA (KLOR-CON) 20 MEQ tablet Take 1 tablet (20 mEq total) by mouth 2 (two) times daily.   prochlorperazine (COMPAZINE) 10 MG tablet Take 1 tablet (10 mg total) by mouth every 6 (six) hours as needed for refractory nausea / vomiting.   valACYclovir (VALTREX) 500 MG tablet Take 1 tablet by mouth daily.   [DISCONTINUED] venetoclax (VENCLEXTA) 100 MG tablet Take 400 mg by mouth daily. Take for 14 days, then hold for 14 days. Repeat every 28 days. Take with a meal and water. (Patient not taking: Reported on 02/04/2021)   Facility-Administered Encounter Medications as of 02/13/2021  Medication   ondansetron (ZOFRAN) tablet 8 mg   potassium chloride (KLOR-CON) CR tablet 40 mEq     Lab Results  Component Value Date   WBC 2.1 (L) 02/18/2021   HGB 12.8 02/18/2021   HCT 37.3 02/18/2021   PLT 58 (L) 02/18/2021   GLUCOSE 103 (H) 02/18/2021   CHOL 163 02/20/2020   TRIG 110.0 02/20/2020   HDL 40.70 02/20/2020   LDLDIRECT 126.0 07/11/2015   LDLCALC 100 (H) 02/20/2020   ALT 19 02/18/2021   AST 13 (L) 02/18/2021   NA 142 02/18/2021   K 3.8 02/18/2021   CL 108 02/18/2021   CREATININE 0.87 02/18/2021   BUN 14 02/18/2021   CO2 28 02/18/2021   TSH 0.034 (L) 10/02/2020   HGBA1C 5.3 12/10/2016       Assessment & Plan:   Problem  List Items Addressed This Visit     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        Anxiety    Continue lexapro.  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  Stable.       GERD (gastroesophageal reflux disease)    No upper symptoms reported. On protonix.       Health care maintenance    Physical today 02/13/21.  Mammogram 03/07/20 - Birads I.  Refer back to GI as  outlined.       History of breast cancer    Mammogram 03/07/20 - birads I.       History of malignant neoplasm of large intestine    Colonoscopy 10/2017.  Being followed by GI. Was due f/u 2020.  Had colonoscopy scheduled.  Canceled due to hip surgery.  Overdue f/u.  Currently being treated for AML.  Persistent loose stool.  Discussed f/u with GI.       Relevant Orders   Ambulatory referral to Gastroenterology   History of thyroid cancer    Follow tsh.       Hypercholesterolemia    Follow lipid panel.       Hypothyroidism    On thyroid replacement.  Follow tsh.       Leukopenia    Being followed by hematology.       Mild depression    Continue lexapro.  Followed by Dr Clovis Riley.       Palpitations    Followed by cardiology.  Last evaluated 01/2021.  Stable.        Stress    Increased stress with her current medical issues.  Seeing Dr Clovis Riley.  Follow.  Notify me if feels needs any further intervention.        Vitamin D deficiency    Follow vitamin D level.       Other Visit Diagnoses     Routine general medical examination at a health care facility    -  Primary        [name], MD

## 2021-02-18 ENCOUNTER — Inpatient Hospital Stay: Payer: PPO

## 2021-02-18 ENCOUNTER — Other Ambulatory Visit: Payer: Self-pay

## 2021-02-18 DIAGNOSIS — C9200 Acute myeloblastic leukemia, not having achieved remission: Secondary | ICD-10-CM

## 2021-02-18 DIAGNOSIS — Z5111 Encounter for antineoplastic chemotherapy: Secondary | ICD-10-CM | POA: Diagnosis not present

## 2021-02-18 LAB — COMPREHENSIVE METABOLIC PANEL
ALT: 19 U/L (ref 0–44)
AST: 13 U/L — ABNORMAL LOW (ref 15–41)
Albumin: 3.5 g/dL (ref 3.5–5.0)
Alkaline Phosphatase: 102 U/L (ref 38–126)
Anion gap: 6 (ref 5–15)
BUN: 14 mg/dL (ref 8–23)
CO2: 28 mmol/L (ref 22–32)
Calcium: 8.7 mg/dL — ABNORMAL LOW (ref 8.9–10.3)
Chloride: 108 mmol/L (ref 98–111)
Creatinine, Ser: 0.87 mg/dL (ref 0.44–1.00)
GFR, Estimated: >60 mL/min (ref >60)
Glucose, Bld: 103 mg/dL — ABNORMAL HIGH (ref 70–99)
Potassium: 3.8 mmol/L (ref 3.5–5.1)
Sodium: 142 mmol/L (ref 135–145)
Total Bilirubin: 0.6 mg/dL (ref 0.3–1.2)
Total Protein: 6.6 g/dL (ref 6.5–8.1)

## 2021-02-18 LAB — CBC WITH DIFFERENTIAL/PLATELET
Abs Immature Granulocytes: 0.01 10*3/uL (ref 0.00–0.07)
Basophils Absolute: 0 10*3/uL (ref 0.0–0.1)
Basophils Relative: 1 %
Eosinophils Absolute: 0 10*3/uL (ref 0.0–0.5)
Eosinophils Relative: 1 %
HCT: 37.3 % (ref 36.0–46.0)
Hemoglobin: 12.8 g/dL (ref 12.0–15.0)
Immature Granulocytes: 1 %
Lymphocytes Relative: 30 %
Lymphs Abs: 0.6 10*3/uL — ABNORMAL LOW (ref 0.7–4.0)
MCH: 28.8 pg (ref 26.0–34.0)
MCHC: 34.3 g/dL (ref 30.0–36.0)
MCV: 83.8 fL (ref 80.0–100.0)
Monocytes Absolute: 0.2 10*3/uL (ref 0.1–1.0)
Monocytes Relative: 11 %
Neutro Abs: 1.2 10*3/uL — ABNORMAL LOW (ref 1.7–7.7)
Neutrophils Relative %: 56 %
Platelets: 58 10*3/uL — ABNORMAL LOW (ref 150–400)
RBC: 4.45 MIL/uL (ref 3.87–5.11)
RDW: 15.4 % (ref 11.5–15.5)
WBC: 2.1 10*3/uL — ABNORMAL LOW (ref 4.0–10.5)
nRBC: 0 % (ref 0.0–0.2)

## 2021-02-18 LAB — SAMPLE TO BLOOD BANK

## 2021-02-18 LAB — MAGNESIUM: Magnesium: 1.7 mg/dL (ref 1.7–2.4)

## 2021-02-23 ENCOUNTER — Encounter: Payer: Self-pay | Admitting: Internal Medicine

## 2021-02-23 NOTE — Assessment & Plan Note (Signed)
Follow vitamin D level.  

## 2021-02-23 NOTE — Assessment & Plan Note (Signed)
Continue lexapro.  Continue f/u with Dr Levine  

## 2021-02-23 NOTE — Assessment & Plan Note (Signed)
Continue lexapro.  Followed by Dr Clovis Riley.

## 2021-02-23 NOTE — Assessment & Plan Note (Signed)
Colonoscopy 10/2017.  Being followed by GI. Was due f/u 2020.  Had colonoscopy scheduled.  Canceled due to hip surgery.  Overdue f/u.  Currently being treated for AML.  Persistent loose stool.  Discussed f/u with GI.

## 2021-02-23 NOTE — Assessment & Plan Note (Signed)
Follow tsh.  

## 2021-02-23 NOTE — Assessment & Plan Note (Signed)
Follow lipid panel.   

## 2021-02-23 NOTE — Assessment & Plan Note (Signed)
Followed by cardiology.  Last evaluated 01/2021.  Stable.

## 2021-02-23 NOTE — Assessment & Plan Note (Signed)
Mammogram 03/07/20 - birads I.

## 2021-02-23 NOTE — Assessment & Plan Note (Signed)
No upper symptoms reported.  On protonix.   

## 2021-02-23 NOTE — Assessment & Plan Note (Signed)
Followed and treated by hematology as outlined.

## 2021-02-23 NOTE — Assessment & Plan Note (Signed)
Noticed since chemo.  Stable.

## 2021-02-23 NOTE — Assessment & Plan Note (Addendum)
Increased stress with her current medical issues.  Seeing Dr Clovis Riley.  Follow.  Notify me if feels needs any further intervention.

## 2021-02-23 NOTE — Assessment & Plan Note (Signed)
Being followed by hematology.   

## 2021-02-23 NOTE — Assessment & Plan Note (Signed)
On thyroid replacement.  Follow tsh.  

## 2021-02-24 ENCOUNTER — Other Ambulatory Visit: Payer: Self-pay | Admitting: Oncology

## 2021-02-24 NOTE — Telephone Encounter (Signed)
  Component Ref Range & Units 6 d ago  (02/18/21) 3 wk ago  (02/03/21) 1 mo ago  (01/16/21) 1 mo ago  (01/10/21) 2 mo ago  (12/16/20)  Potassium 3.5 - 5.1 mmol/L 3.8  3.8  4.4  3.3 Low   3.2 Low      Component Ref Range & Units 6 d ago  (02/18/21) 3 wk ago  (02/03/21) 1 mo ago  (01/16/21) 1 mo ago  (01/10/21) 2 mo ago  (12/02/20)  Magnesium 1.7 - 2.4 mg/dL 1.7  1.5 Low  CM  1.7 CM  1.6 Low  CM  1.3 Low  CM

## 2021-02-27 NOTE — Progress Notes (Signed)
[place]  Telephone:([phone]) [phone] Fax:(336) [phone]  ID: Debra Hale OB: 1950/09/19  MR#: [id number]  OEH#:[id number]  Patient Care Team: [name], MD as PCP - General (Internal Medicine) Wellington Hampshire, MD as PCP - Cardiology (Cardiology)  CHIEF COMPLAINT: AML.  INTERVAL HISTORY: Patient returns to clinic today for further evaluation and continuation of maintenance Vidaza.  She currently feels well and is asymptomatic.  She continues to tolerate her treatments well without significant side effects.  She does not complain of any weakness or fatigue today.  She denies any pain.  She has no neurologic complaints.  She denies any recent fevers or illnesses. She has no chest pain, shortness of breath, cough, or hemoptysis.  She denies any nausea, vomiting, constipation, or diarrhea.  She has no urinary complaints.  Patient offers no specific complaints today.  REVIEW OF SYSTEMS:   Review of Systems  Constitutional: Negative.  Negative for fever, malaise/fatigue and weight loss.  Respiratory: Negative.  Negative for cough, hemoptysis and shortness of breath.   Cardiovascular: Negative.  Negative for chest pain and leg swelling.  Gastrointestinal:  Positive for diarrhea. Negative for abdominal pain.  Genitourinary: Negative.  Negative for dysuria.  Musculoskeletal: Negative.  Negative for back pain.  Skin: Negative.  Negative for rash.  Neurological: Negative.  Negative for dizziness, focal weakness, weakness and headaches.  Psychiatric/Behavioral: Negative.  Negative for memory loss. The patient is not nervous/anxious.    As per HPI. Otherwise, a complete review of systems is negative.  PAST MEDICAL HISTORY: Past Medical History:  Diagnosis Date   Anxiety    Arthritis    Osteoarthritis   Breast cancer ([place]) 2009   right breast lumpectomy with rad tx   Colon cancer ([place])    surgery with chemo and rad tx   Complication of anesthesia    GERD  (gastroesophageal reflux disease)    Heart palpitations    History of hiatal hernia    History of kidney stones    Hypothyroidism    Liver disease    Liver nodule    s/p negative biopsy   Malignant neoplasm of thyroid gland ([place]) 2002   s/p surgery and XRT   Osteoporosis    Other and unspecified hyperlipidemia    Palpitations    Personal history of chemotherapy    Personal history of malignant neoplasm of large intestine    carcinoma - cecum, s/p right laparoscopic colectomy - s/p chemotherapy and XRT   Personal history of radiation therapy    Pneumonia 2019   PONV (postoperative nausea and vomiting)    Pure hypercholesterolemia    Unspecified hereditary and idiopathic peripheral neuropathy     PAST SURGICAL HISTORY: Past Surgical History:  Procedure Laterality Date   APPENDECTOMY  1985   BREAST BIOPSY Right 2009   positive   BREAST BIOPSY Right 2009   negative   BREAST LUMPECTOMY Right 2009   breast cancer   CHOLECYSTECTOMY  1995   COLONOSCOPY     COLONOSCOPY WITH PROPOFOL N/A 10/29/2017   Procedure: COLONOSCOPY WITH PROPOFOL;  Surgeon: Manya Silvas, MD;  Location[facility] ENDOSCOPY;  Service: Endoscopy;  Laterality: N/A;   DILATION AND CURETTAGE OF UTERUS  1990   DILATION AND CURETTAGE, DIAGNOSTIC / THERAPEUTIC  1990   ESOPHAGOGASTRODUODENOSCOPY     ESOPHAGOGASTRODUODENOSCOPY (EGD) WITH PROPOFOL N/A 10/29/2017   Procedure: ESOPHAGOGASTRODUODENOSCOPY (EGD) WITH PROPOFOL;  Surgeon: Manya Silvas, MD;  Location: [facility] ENDOSCOPY;  Service: Endoscopy;  Laterality: N/A;  INCISION AND DRAINAGE PERIRECTAL ABSCESS N/A 06/17/2020   Procedure: IRRIGATION AND DEBRIDEMENT PERIRECTAL ABSCESS;  Surgeon: Jules Husbands, MD;  Location: [facility] ORS;  Service: General;  Laterality: N/A;   LAPAROSCOPIC PARTIAL COLECTOMY     stage 3-C carcinoma of the cecum, s/p chemotherapy and xrt   LITHOTRIPSY     SIGMOIDOSCOPY  08/26/1993   THYROID LOBECTOMY  2002   s/p XRT   TOTAL HIP ARTHROPLASTY  Right 10/24/2019   Procedure: TOTAL HIP ARTHROPLASTY;  Surgeon: Corky Mull, MD;  Location: [facility] ORS;  Service: Orthopedics;  Laterality: Right;    FAMILY HISTORY: Family History  Problem Relation Age of Onset   Stroke Mother        62s   Alzheimer's disease Mother    Cancer Mother    Lung cancer Father    Prostate cancer Father    Cancer Father        Colon   Colon cancer Father    Breast cancer Sister        41's   Lung cancer Sister    Breast cancer Maternal Aunt     ADVANCED DIRECTIVES (Y/N):  N  HEALTH MAINTENANCE: Social History   Tobacco Use   Smoking status: Never   Smokeless tobacco: Never  Vaping Use   Vaping Use: Never used  Substance Use Topics   Alcohol use: No    Alcohol/week: 0.0 standard drinks   Drug use: No     Colonoscopy:  PAP:  Bone density:  Lipid panel:  Allergies  Allergen Reactions   Demeclocycline Other (See Comments)    Throat swells   Sulfa Antibiotics Other (See Comments)    Other reaction(s): Other (See Comments) Throat swells Other reaction(s): Other (See Comments) Throat swells   Tetracyclines & Related Other (See Comments)    Throat swells    Augmentin [Amoxicillin-Pot Clavulanate] Diarrhea   Bentyl [Dicyclomine Hcl]     unkn   Ciprofloxacin Diarrhea   Codeine Other (See Comments)    dizziness     Dicyclomine Hcl Other (See Comments)    unkn   Epinephrine     Speeds heart up - panic    Flagyl [Metronidazole] Nausea And Vomiting   Librax [Chlordiazepoxide-Clidinium]     unkn   Novocain [Procaine] Other (See Comments)    "Shaky"   Phenobarbital     unkn   Prednisone     Nervous     Ultram [Tramadol] Other (See Comments)    Sick feeling    Current Outpatient Medications  Medication Sig Dispense Refill   Azelastine-Fluticasone 137-50 MCG/ACT SUSP SPRAY 2 SPRAYS INTO EACH NOSTRIL EVERY DAY 23 g 2   clonazePAM (KLONOPIN) 0.5 MG tablet Take 1.5 mg by mouth 2 (two) times daily.   0   escitalopram (LEXAPRO)  20 MG tablet Take 20 mg by mouth every morning.      levothyroxine (SYNTHROID) 125 MCG tablet Take 125 mcg by mouth daily before breakfast.     loperamide (IMODIUM) 2 MG capsule Take by mouth.     magnesium oxide (MAG-OX) 400 MG tablet TAKE 1 TABLET BY MOUTH DAILY 30 tablet 0   metoprolol tartrate (LOPRESSOR) 25 MG tablet TAKE 1 TABLET (25 MG TOTAL) BY MOUTH AS NEEDED (AS NEEDED UP TO TWICE DAILY FOR PALPITATIONS). 60 tablet 1   pantoprazole (PROTONIX) 40 MG tablet TAKE ONE TABLET (40 MG) BY MOUTH EVERY DAY 90 tablet 2   potassium chloride SA (KLOR-CON) 20 MEQ tablet TAKE ONE (1)  TABLET BY MOUTH TWO TIMES PER DAY 60 tablet 0   prochlorperazine (COMPAZINE) 10 MG tablet Take 1 tablet (10 mg total) by mouth every 6 (six) hours as needed for refractory nausea / vomiting. 30 tablet 0   valACYclovir (VALTREX) 500 MG tablet Take 1 tablet by mouth daily.     diphenoxylate-atropine (LOMOTIL) 2.5-0.025 MG tablet Take 1 tablet by mouth 4 (four) times daily as needed for diarrhea or loose stools. 120 tablet 1   Current Facility-Administered Medications  Medication Dose Route Frequency Provider Last Rate Last Admin   ondansetron (ZOFRAN) tablet 8 mg  8 mg Oral Once Guse, Jacquelynn Cree, FNP       Facility-Administered Medications Ordered in Other Visits  Medication Dose Route Frequency Provider Last Rate Last Admin   potassium chloride (KLOR-CON) CR tablet 40 mEq  40 mEq Oral BID Lloyd Huger, MD   40 mEq at 07/08/20 1136    OBJECTIVE: Vitals:   03/04/21 1500  BP: 119/75  Pulse: 82  Resp: 16  Temp: 97.8 F (36.6 C)  SpO2: 99%     Body mass index is 22.25 kg/m.    ECOG FS:0 - Asymptomatic  General: Well-developed, well-nourished, no acute distress. Eyes: Pink conjunctiva, anicteric sclera. HEENT: Normocephalic, moist mucous membranes. Lungs: No audible wheezing or coughing. Heart: Regular rate and rhythm. Abdomen: Soft, nontender, no obvious distention. Musculoskeletal: No edema, cyanosis,  or clubbing. Neuro: Alert, answering all questions appropriately. Cranial nerves grossly intact. Skin: No rashes or petechiae noted. Psych: Normal affect.  LAB RESULTS:  Lab Results  Component Value Date   NA 140 03/03/2021   K 3.8 03/03/2021   CL 106 03/03/2021   CO2 27 03/03/2021   GLUCOSE 105 (H) 03/03/2021   BUN 20 03/03/2021   CREATININE 0.87 03/03/2021   CALCIUM 8.4 (L) 03/03/2021   PROT 6.7 03/03/2021   ALBUMIN 3.7 03/03/2021   AST 15 03/03/2021   ALT 21 03/03/2021   ALKPHOS 106 03/03/2021   BILITOT 0.8 03/03/2021   GFRNONAA >60 03/03/2021   GFRAA 54 (L) 10/26/2019    Lab Results  Component Value Date   WBC 1.2 (LL) 03/03/2021   NEUTROABS 0.7 (L) 03/03/2021   HGB 13.3 03/03/2021   HCT 38.8 03/03/2021   MCV 83.8 03/03/2021   PLT 163 03/03/2021     STUDIES: No results found.   ASSESSMENT: AML.  PLAN:    1.  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  Her peripheral blood flow cytometry also revealed 6% aberrant myeloblasts.  Patient completed cycle 1 of induction therapy at [facility] with Vidaza and venetoclax.  She only received 5 days of Vidaza for cycle 2 recently which was interrupted secondary to significant diarrhea and a perirectal abscess.  Repeat bone marrow biopsy on January 02, 2021 did not reveal any overt disease, but cytogenetics remained positive likely indicating a very good partial remission.  Patient has been instructed to discontinue venetoclax.  Will continue maintenance treatment with Vidaza on days 1 through 5 every 28 days.  Proceed with cycle 10 of treatment today.  Return to clinic daily for the next 3 days to complete the cycle.  Patient then return to clinic in 2 weeks for laboratory work only and then in 4 weeks for further evaluation and consideration of cycle 11.  2.  Neutropenia: Chronic and unchanged.  Bone marrow biopsy as above. 3.  Thrombocytopenia: Resolved.  Discontinue venetoclax as above.   4.  Perirectal abscess: Resolved.   5.  Diarrhea: Patient was given a refill of her Lomotil today. 6.  Supportive care: Patient has been given referrals to home health and home palliative care.  Appreciate palliative care input.   7.  Hypokalemia: Resolved. 8.  Hypomagnesia: Resolved.  Continue oral magnesium supplementation.  9.  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  Patient also received chemotherapy and likely received Adriamycin, but exact regimen is unknown.  She completed 5 years of hormonal therapy in approximately June 2015. Currently, she has no evidence of disease.  Her most recent mammogram on March 05, 2020 was reported as BI-RADS 1.  Repeat in November 2022. 10.  History of colon cancer: Patient also states that she received chemotherapy for this, possibly FOLFOX but again this is unknown.   Patient expressed understanding and was in agreement with this plan. She also understands that She can call clinic at any time with any questions, concerns, or complaints.   Cancer Staging History of breast cancer Staging form: Breast, AJCC 7th Edition - Clinical stage from 01/07/2016: Stage IA (T1c, N0, M0) - Signed by Lloyd Huger, MD on 01/07/2016 Laterality: Right Estrogen receptor status: Positive Progesterone receptor status: Positive HER2 status: Negative   Lloyd Huger, MD   03/05/2021 10:28 AM

## 2021-02-28 ENCOUNTER — Other Ambulatory Visit: Payer: Self-pay | Admitting: Internal Medicine

## 2021-03-03 ENCOUNTER — Inpatient Hospital Stay: Payer: PPO

## 2021-03-03 ENCOUNTER — Other Ambulatory Visit: Payer: Self-pay

## 2021-03-03 VITALS — BP 118/77 | HR 80 | Temp 98.5°F | Resp 18

## 2021-03-03 DIAGNOSIS — Z5111 Encounter for antineoplastic chemotherapy: Secondary | ICD-10-CM | POA: Diagnosis not present

## 2021-03-03 DIAGNOSIS — C9200 Acute myeloblastic leukemia, not having achieved remission: Secondary | ICD-10-CM

## 2021-03-03 LAB — CBC WITH DIFFERENTIAL/PLATELET
Abs Immature Granulocytes: 0 10*3/uL (ref 0.00–0.07)
Basophils Absolute: 0 10*3/uL (ref 0.0–0.1)
Basophils Relative: 1 %
Eosinophils Absolute: 0 10*3/uL (ref 0.0–0.5)
Eosinophils Relative: 2 %
HCT: 38.8 % (ref 36.0–46.0)
Hemoglobin: 13.3 g/dL (ref 12.0–15.0)
Immature Granulocytes: 0 %
Lymphocytes Relative: 40 %
Lymphs Abs: 0.5 10*3/uL — ABNORMAL LOW (ref 0.7–4.0)
MCH: 28.7 pg (ref 26.0–34.0)
MCHC: 34.3 g/dL (ref 30.0–36.0)
MCV: 83.8 fL (ref 80.0–100.0)
Monocytes Absolute: 0 10*3/uL — ABNORMAL LOW (ref 0.1–1.0)
Monocytes Relative: 3 %
Neutro Abs: 0.7 10*3/uL — ABNORMAL LOW (ref 1.7–7.7)
Neutrophils Relative %: 54 %
Platelets: 163 10*3/uL (ref 150–400)
RBC: 4.63 MIL/uL (ref 3.87–5.11)
RDW: 14.9 % (ref 11.5–15.5)
WBC: 1.2 10*3/uL — CL (ref 4.0–10.5)
nRBC: 0 % (ref 0.0–0.2)

## 2021-03-03 LAB — MAGNESIUM: Magnesium: 1.8 mg/dL (ref 1.7–2.4)

## 2021-03-03 LAB — COMPREHENSIVE METABOLIC PANEL
ALT: 21 U/L (ref 0–44)
AST: 15 U/L (ref 15–41)
Albumin: 3.7 g/dL (ref 3.5–5.0)
Alkaline Phosphatase: 106 U/L (ref 38–126)
Anion gap: 7 (ref 5–15)
BUN: 20 mg/dL (ref 8–23)
CO2: 27 mmol/L (ref 22–32)
Calcium: 8.4 mg/dL — ABNORMAL LOW (ref 8.9–10.3)
Chloride: 106 mmol/L (ref 98–111)
Creatinine, Ser: 0.87 mg/dL (ref 0.44–1.00)
GFR, Estimated: >60 mL/min (ref >60)
Glucose, Bld: 105 mg/dL — ABNORMAL HIGH (ref 70–99)
Potassium: 3.8 mmol/L (ref 3.5–5.1)
Sodium: 140 mmol/L (ref 135–145)
Total Bilirubin: 0.8 mg/dL (ref 0.3–1.2)
Total Protein: 6.7 g/dL (ref 6.5–8.1)

## 2021-03-03 LAB — SAMPLE TO BLOOD BANK

## 2021-03-03 MED ORDER — ONDANSETRON HCL 4 MG PO TABS
8.0000 mg | ORAL_TABLET | Freq: Once | ORAL | Status: AC
  Administered 2021-03-03: 8 mg via ORAL
  Filled 2021-03-03: qty 2

## 2021-03-03 MED ORDER — AZACITIDINE CHEMO SQ INJECTION
50.0000 mg/m2 | Freq: Once | INTRAMUSCULAR | Status: AC
  Administered 2021-03-03: 87.5 mg via SUBCUTANEOUS
  Filled 2021-03-03: qty 3.5

## 2021-03-03 NOTE — Progress Notes (Signed)
Per Dr. [name] - proceed with today's treatment.  He is aware of all lab values for today.

## 2021-03-03 NOTE — Patient Instructions (Signed)
[facility]  Discharge Instructions: Thank you for choosing [facility] to provide your oncology and hematology care.  If you have a lab appointment with the Cancer Center, please go directly to the Cancer Center and check in at the registration area.  Wear comfortable clothing and clothing appropriate for easy access to any Portacath or PICC line.   We strive to give you quality time with your provider. You may need to reschedule your appointment if you arrive late (15 or more minutes).  Arriving late affects you and other patients whose appointments are after yours.  Also, if you miss three or more appointments without notifying the office, you may be dismissed from the clinic at the provider's discretion.      For prescription refill requests, have your pharmacy contact our office and allow 72 hours for refills to be completed.    Today you received the following chemotherapy and/or immunotherapy agents       To help prevent nausea and vomiting after your treatment, we encourage you to take your nausea medication as directed.  BELOW ARE SYMPTOMS THAT SHOULD BE REPORTED IMMEDIATELY: *FEVER GREATER THAN 100.4 F (38 C) OR HIGHER *CHILLS OR SWEATING *NAUSEA AND VOMITING THAT IS NOT CONTROLLED WITH YOUR NAUSEA MEDICATION *UNUSUAL SHORTNESS OF BREATH *UNUSUAL BRUISING OR BLEEDING *URINARY PROBLEMS (pain or burning when urinating, or frequent urination) *BOWEL PROBLEMS (unusual diarrhea, constipation, pain near the anus) TENDERNESS IN MOUTH AND THROAT WITH OR WITHOUT PRESENCE OF ULCERS (sore throat, sores in mouth, or a toothache) UNUSUAL RASH, SWELLING OR PAIN  UNUSUAL VAGINAL DISCHARGE OR ITCHING   Items with * indicate a potential emergency and should be followed up as soon as possible or go to the Emergency Department if any problems should occur.  Please show the CHEMOTHERAPY ALERT CARD or IMMUNOTHERAPY ALERT CARD at check-in to the  Emergency Department and triage nurse.  Should you have questions after your visit or need to cancel or reschedule your appointment, please contact [facility]  [phone] and follow the prompts.  Office hours are 8:00 a.m. to 4:30 p.m. Monday - Friday. Please note that voicemails left after 4:00 p.m. may not be returned until the following business day.  We are closed weekends and major holidays. You have access to a nurse at all times for urgent questions. Please call the main number to the clinic [phone] and follow the prompts.  For any non-urgent questions, you may also contact your provider using MyChart. We now offer e-Visits for anyone 18 and older to request care online for non-urgent symptoms. For details visit [email or web address].   Also download the MyChart app! Go to the app store, search "MyChart", open the app, select [facility], and log in with your MyChart username and password.  Due to Covid, a mask is required upon entering the hospital/clinic. If you do not have a mask, one will be given to you upon arrival. For doctor visits, patients may have 1 support person aged 18 or older with them. For treatment visits, patients cannot have anyone with them due to current Covid guidelines and our immunocompromised population.  

## 2021-03-04 ENCOUNTER — Inpatient Hospital Stay: Payer: PPO | Attending: Oncology | Admitting: Oncology

## 2021-03-04 ENCOUNTER — Inpatient Hospital Stay: Payer: PPO

## 2021-03-04 VITALS — BP 127/68 | HR 73 | Temp 97.8°F | Resp 18 | Wt 129.9 lb

## 2021-03-04 VITALS — BP 119/75 | HR 82 | Temp 97.8°F | Resp 16 | Wt 129.6 lb

## 2021-03-04 DIAGNOSIS — Z5111 Encounter for antineoplastic chemotherapy: Secondary | ICD-10-CM | POA: Diagnosis not present

## 2021-03-04 DIAGNOSIS — D709 Neutropenia, unspecified: Secondary | ICD-10-CM | POA: Diagnosis not present

## 2021-03-04 DIAGNOSIS — R5383 Other fatigue: Secondary | ICD-10-CM | POA: Insufficient documentation

## 2021-03-04 DIAGNOSIS — M810 Age-related osteoporosis without current pathological fracture: Secondary | ICD-10-CM | POA: Insufficient documentation

## 2021-03-04 DIAGNOSIS — F419 Anxiety disorder, unspecified: Secondary | ICD-10-CM | POA: Insufficient documentation

## 2021-03-04 DIAGNOSIS — R531 Weakness: Secondary | ICD-10-CM | POA: Diagnosis not present

## 2021-03-04 DIAGNOSIS — E039 Hypothyroidism, unspecified: Secondary | ICD-10-CM | POA: Diagnosis not present

## 2021-03-04 DIAGNOSIS — K219 Gastro-esophageal reflux disease without esophagitis: Secondary | ICD-10-CM | POA: Insufficient documentation

## 2021-03-04 DIAGNOSIS — Z923 Personal history of irradiation: Secondary | ICD-10-CM | POA: Diagnosis not present

## 2021-03-04 DIAGNOSIS — C9200 Acute myeloblastic leukemia, not having achieved remission: Secondary | ICD-10-CM | POA: Insufficient documentation

## 2021-03-04 DIAGNOSIS — Z85850 Personal history of malignant neoplasm of thyroid: Secondary | ICD-10-CM | POA: Diagnosis not present

## 2021-03-04 DIAGNOSIS — Z79899 Other long term (current) drug therapy: Secondary | ICD-10-CM | POA: Insufficient documentation

## 2021-03-04 DIAGNOSIS — Z79631 Long term (current) use of antimetabolite agent: Secondary | ICD-10-CM | POA: Diagnosis not present

## 2021-03-04 DIAGNOSIS — R197 Diarrhea, unspecified: Secondary | ICD-10-CM | POA: Insufficient documentation

## 2021-03-04 DIAGNOSIS — Z853 Personal history of malignant neoplasm of breast: Secondary | ICD-10-CM | POA: Diagnosis not present

## 2021-03-04 DIAGNOSIS — M1990 Unspecified osteoarthritis, unspecified site: Secondary | ICD-10-CM | POA: Insufficient documentation

## 2021-03-04 DIAGNOSIS — Z801 Family history of malignant neoplasm of trachea, bronchus and lung: Secondary | ICD-10-CM | POA: Insufficient documentation

## 2021-03-04 MED ORDER — AZACITIDINE CHEMO SQ INJECTION
50.0000 mg/m2 | Freq: Once | INTRAMUSCULAR | Status: AC
  Administered 2021-03-04: 87.5 mg via SUBCUTANEOUS
  Filled 2021-03-04: qty 3.5

## 2021-03-04 MED ORDER — DIPHENOXYLATE-ATROPINE 2.5-0.025 MG PO TABS: 1.0000 | ORAL_TABLET | Freq: Four times a day (QID) | ORAL | 1 refills | Status: DC | PRN

## 2021-03-04 MED ORDER — PROCHLORPERAZINE MALEATE 10 MG PO TABS
10.0000 mg | ORAL_TABLET | Freq: Four times a day (QID) | ORAL | Status: DC | PRN
  Administered 2021-03-04: 10 mg via ORAL
  Filled 2021-03-04: qty 1

## 2021-03-04 NOTE — Patient Instructions (Signed)
[facility]   Discharge Instructions: Thank you for choosing [facility] to provide your oncology and hematology care.  If you have a lab appointment with the [facility], please go directly to the [facility] and check in at the registration area.  We strive to give you quality time with your provider. You may need to reschedule your appointment if you arrive late (15 or more minutes).  Arriving late affects you and other patients whose appointments are after yours.  Also, if you miss three or more appointments without notifying the office, you may be dismissed from the clinic at the provider's discretion.      For prescription refill requests, have your pharmacy contact our office and allow 72 hours for refills to be completed.    Today you received the following chemotherapy and/or immunotherapy agents: Vidaza.      To help prevent nausea and vomiting after your treatment, we encourage you to take your nausea medication as directed.  BELOW ARE SYMPTOMS THAT SHOULD BE REPORTED IMMEDIATELY: *FEVER GREATER THAN 100.4 F (38 C) OR HIGHER *CHILLS OR SWEATING *NAUSEA AND VOMITING THAT IS NOT CONTROLLED WITH YOUR NAUSEA MEDICATION *UNUSUAL SHORTNESS OF BREATH *UNUSUAL BRUISING OR BLEEDING *URINARY PROBLEMS (pain or burning when urinating, or frequent urination) *BOWEL PROBLEMS (unusual diarrhea, constipation, pain near the anus) TENDERNESS IN MOUTH AND THROAT WITH OR WITHOUT PRESENCE OF ULCERS (sore throat, sores in mouth, or a toothache) UNUSUAL RASH, SWELLING OR PAIN  UNUSUAL VAGINAL DISCHARGE OR ITCHING   Items with * indicate a potential emergency and should be followed up as soon as possible or go to the Emergency Department if any problems should occur.  Please show the CHEMOTHERAPY ALERT CARD or IMMUNOTHERAPY ALERT CARD at check-in to the Emergency Department and triage nurse.  Should you have questions after your visit or need to  cancel or reschedule your appointment, please contact [facility]  [phone] and follow the prompts.  Office hours are 8:00 a.m. to 4:30 p.m. Monday - Friday. Please note that voicemails left after 4:00 p.m. may not be returned until the following business day.  We are closed weekends and major holidays. You have access to a nurse at all times for urgent questions. Please call the main number to the clinic [phone] and follow the prompts.  For any non-urgent questions, you may also contact your provider using MyChart. We now offer e-Visits for anyone 1 and older to request care online for non-urgent symptoms. For details visit [email or web address].   Also download the MyChart app! Go to the app store, search "MyChart", open the app, select [facility], and log in with your MyChart username and password.  Due to Covid, a mask is required upon entering the hospital/clinic. If you do not have a mask, one will be given to you upon arrival. For doctor visits, patients may have 1 support person aged 36 or older with them. For treatment visits, patients cannot have anyone with them due to current Covid guidelines and our immunocompromised population.

## 2021-03-04 NOTE — Progress Notes (Signed)
Offered pt chaplin services per Dr. [name] request. Pt quickly states "No. Not any chaplin here."

## 2021-03-04 NOTE — Progress Notes (Signed)
Pt endorses depression and feelings of hopelessness for "a long time." Pt states she has talked to someone but feels like "no one really cares." Attempted to use therapeutic communication to allow the pt to talk. Pt appears withdrawn with poor eye contact. Pt denies wanting to harm self or end her life. Pt states "I am wasting away quickly enough." Pt c/o diarrhea.

## 2021-03-05 ENCOUNTER — Other Ambulatory Visit: Payer: Self-pay

## 2021-03-05 ENCOUNTER — Encounter: Payer: Self-pay | Admitting: Oncology

## 2021-03-05 ENCOUNTER — Inpatient Hospital Stay: Payer: PPO

## 2021-03-05 VITALS — BP 133/66 | HR 75 | Temp 97.2°F | Resp 18

## 2021-03-05 DIAGNOSIS — C9200 Acute myeloblastic leukemia, not having achieved remission: Secondary | ICD-10-CM

## 2021-03-05 DIAGNOSIS — Z5111 Encounter for antineoplastic chemotherapy: Secondary | ICD-10-CM | POA: Diagnosis not present

## 2021-03-05 MED ORDER — PROCHLORPERAZINE MALEATE 10 MG PO TABS
10.0000 mg | ORAL_TABLET | Freq: Four times a day (QID) | ORAL | Status: DC | PRN
  Administered 2021-03-05: 10 mg via ORAL
  Filled 2021-03-05: qty 1

## 2021-03-05 MED ORDER — AZACITIDINE CHEMO SQ INJECTION
50.0000 mg/m2 | Freq: Once | INTRAMUSCULAR | Status: AC
  Administered 2021-03-05: 87.5 mg via SUBCUTANEOUS
  Filled 2021-03-05: qty 3.5

## 2021-03-05 NOTE — Progress Notes (Signed)
Nutrition Follow-up:  Patient with AML and receiving vidaza  Met with patient during infusion.  Patient reports that she has a good appetite.  Ate pancakes and eggs for breakfast this am. Last night neighbor brought her a barbecue sandwich with slaw and potato salad that she ate.     Medications: reviewed  Labs: reviewed  Anthropometrics:   Weight 129 lb 14.4 oz on 11/1  127 lb on 10/4 126 lb on 7/19 128 lb 8 oz on 6/21   NUTRITION DIAGNOSIS: Inadequate oral intake improved    INTERVENTION:  Continue high calorie, high protein foods to prevent weight loss    MONITORING, EVALUATION, GOAL: weight trends, intake   NEXT VISIT: as needed   B. Zenia Resides, [place], [place] Registered Dietitian [phone] (mobile)

## 2021-03-05 NOTE — Patient Instructions (Signed)
[facility]   Discharge Instructions: Thank you for choosing [facility] to provide your oncology and hematology care.  If you have a lab appointment with the [facility], please go directly to the [facility] and check in at the registration area.  We strive to give you quality time with your provider. You may need to reschedule your appointment if you arrive late (15 or more minutes).  Arriving late affects you and other patients whose appointments are after yours.  Also, if you miss three or more appointments without notifying the office, you may be dismissed from the clinic at the provider's discretion.      For prescription refill requests, have your pharmacy contact our office and allow 72 hours for refills to be completed.    Today you received the following chemotherapy and/or immunotherapy agents: Vidaza.      To help prevent nausea and vomiting after your treatment, we encourage you to take your nausea medication as directed.  BELOW ARE SYMPTOMS THAT SHOULD BE REPORTED IMMEDIATELY: *FEVER GREATER THAN 100.4 F (38 C) OR HIGHER *CHILLS OR SWEATING *NAUSEA AND VOMITING THAT IS NOT CONTROLLED WITH YOUR NAUSEA MEDICATION *UNUSUAL SHORTNESS OF BREATH *UNUSUAL BRUISING OR BLEEDING *URINARY PROBLEMS (pain or burning when urinating, or frequent urination) *BOWEL PROBLEMS (unusual diarrhea, constipation, pain near the anus) TENDERNESS IN MOUTH AND THROAT WITH OR WITHOUT PRESENCE OF ULCERS (sore throat, sores in mouth, or a toothache) UNUSUAL RASH, SWELLING OR PAIN  UNUSUAL VAGINAL DISCHARGE OR ITCHING   Items with * indicate a potential emergency and should be followed up as soon as possible or go to the Emergency Department if any problems should occur.  Please show the CHEMOTHERAPY ALERT CARD or IMMUNOTHERAPY ALERT CARD at check-in to the Emergency Department and triage nurse.  Should you have questions after your visit or need to  cancel or reschedule your appointment, please contact [facility]  [phone] and follow the prompts.  Office hours are 8:00 a.m. to 4:30 p.m. Monday - Friday. Please note that voicemails left after 4:00 p.m. may not be returned until the following business day.  We are closed weekends and major holidays. You have access to a nurse at all times for urgent questions. Please call the main number to the clinic [phone] and follow the prompts.  For any non-urgent questions, you may also contact your provider using MyChart. We now offer e-Visits for anyone 34 and older to request care online for non-urgent symptoms. For details visit [email or web address].   Also download the MyChart app! Go to the app store, search "MyChart", open the app, select [facility], and log in with your MyChart username and password.  Due to Covid, a mask is required upon entering the hospital/clinic. If you do not have a mask, one will be given to you upon arrival. For doctor visits, patients may have 1 support person aged 32 or older with them. For treatment visits, patients cannot have anyone with them due to current Covid guidelines and our immunocompromised population.

## 2021-03-06 ENCOUNTER — Inpatient Hospital Stay: Payer: PPO

## 2021-03-06 VITALS — BP 132/74 | HR 74 | Temp 97.7°F | Resp 16

## 2021-03-06 DIAGNOSIS — Z5111 Encounter for antineoplastic chemotherapy: Secondary | ICD-10-CM | POA: Diagnosis not present

## 2021-03-06 DIAGNOSIS — C9200 Acute myeloblastic leukemia, not having achieved remission: Secondary | ICD-10-CM

## 2021-03-06 MED ORDER — PROCHLORPERAZINE MALEATE 10 MG PO TABS: 10.0000 mg | ORAL_TABLET | Freq: Four times a day (QID) | ORAL | Status: DC | PRN

## 2021-03-06 MED ORDER — AZACITIDINE CHEMO SQ INJECTION
50.0000 mg/m2 | Freq: Once | INTRAMUSCULAR | Status: AC
  Administered 2021-03-06: 87.5 mg via SUBCUTANEOUS
  Filled 2021-03-06: qty 3.5

## 2021-03-06 MED ORDER — ONDANSETRON HCL 4 MG PO TABS
8.0000 mg | ORAL_TABLET | Freq: Once | ORAL | Status: AC
  Administered 2021-03-06: 8 mg via ORAL
  Filled 2021-03-06: qty 2

## 2021-03-06 NOTE — Progress Notes (Signed)
Patient returned today for her Vidaza injection and complained of vomiting 4 times after her injection yesterday. Patient asked if she could switch back to taking Zofran instead of Compazine for nausea. Dr. [name] made aware and ordered Zofran 8 mg PO as a pre med prior to her [place].

## 2021-03-07 ENCOUNTER — Inpatient Hospital Stay: Payer: PPO

## 2021-03-07 ENCOUNTER — Other Ambulatory Visit: Payer: Self-pay

## 2021-03-07 ENCOUNTER — Other Ambulatory Visit: Payer: Self-pay | Admitting: Oncology

## 2021-03-07 VITALS — BP 133/66 | HR 70 | Temp 97.1°F | Resp 18

## 2021-03-07 DIAGNOSIS — C9200 Acute myeloblastic leukemia, not having achieved remission: Secondary | ICD-10-CM

## 2021-03-07 DIAGNOSIS — Z5111 Encounter for antineoplastic chemotherapy: Secondary | ICD-10-CM | POA: Diagnosis not present

## 2021-03-07 MED ORDER — ONDANSETRON HCL 4 MG PO TABS
8.0000 mg | ORAL_TABLET | Freq: Once | ORAL | Status: AC
  Administered 2021-03-07: 8 mg via ORAL
  Filled 2021-03-07: qty 2

## 2021-03-07 MED ORDER — PROCHLORPERAZINE MALEATE 10 MG PO TABS
10.0000 mg | ORAL_TABLET | Freq: Four times a day (QID) | ORAL | Status: DC | PRN
  Filled 2021-03-07: qty 1

## 2021-03-07 MED ORDER — AZACITIDINE CHEMO SQ INJECTION
50.0000 mg/m2 | Freq: Once | INTRAMUSCULAR | Status: AC
  Administered 2021-03-07: 87.5 mg via SUBCUTANEOUS
  Filled 2021-03-07: qty 3.5

## 2021-03-07 NOTE — Progress Notes (Signed)
Patient unable to tolerated Compazine. Zofran 8mg  PO ordered per Dr [name]

## 2021-03-07 NOTE — Patient Instructions (Signed)
[facility]   Discharge Instructions: Thank you for choosing [facility] to provide your oncology and hematology care.  If you have a lab appointment with the [facility], please go directly to the [facility] and check in at the registration area.  We strive to give you quality time with your provider. You may need to reschedule your appointment if you arrive late (15 or more minutes).  Arriving late affects you and other patients whose appointments are after yours.  Also, if you miss three or more appointments without notifying the office, you may be dismissed from the clinic at the provider's discretion.      For prescription refill requests, have your pharmacy contact our office and allow 72 hours for refills to be completed.    Today you received the following chemotherapy and/or immunotherapy agents: Vidaza.      To help prevent nausea and vomiting after your treatment, we encourage you to take your nausea medication as directed.  BELOW ARE SYMPTOMS THAT SHOULD BE REPORTED IMMEDIATELY: *FEVER GREATER THAN 100.4 F (38 C) OR HIGHER *CHILLS OR SWEATING *NAUSEA AND VOMITING THAT IS NOT CONTROLLED WITH YOUR NAUSEA MEDICATION *UNUSUAL SHORTNESS OF BREATH *UNUSUAL BRUISING OR BLEEDING *URINARY PROBLEMS (pain or burning when urinating, or frequent urination) *BOWEL PROBLEMS (unusual diarrhea, constipation, pain near the anus) TENDERNESS IN MOUTH AND THROAT WITH OR WITHOUT PRESENCE OF ULCERS (sore throat, sores in mouth, or a toothache) UNUSUAL RASH, SWELLING OR PAIN  UNUSUAL VAGINAL DISCHARGE OR ITCHING   Items with * indicate a potential emergency and should be followed up as soon as possible or go to the Emergency Department if any problems should occur.  Please show the CHEMOTHERAPY ALERT CARD or IMMUNOTHERAPY ALERT CARD at check-in to the Emergency Department and triage nurse.  Should you have questions after your visit or need to  cancel or reschedule your appointment, please contact [facility]  [phone] and follow the prompts.  Office hours are 8:00 a.m. to 4:30 p.m. Monday - Friday. Please note that voicemails left after 4:00 p.m. may not be returned until the following business day.  We are closed weekends and major holidays. You have access to a nurse at all times for urgent questions. Please call the main number to the clinic [phone] and follow the prompts.  For any non-urgent questions, you may also contact your provider using MyChart. We now offer e-Visits for anyone 76 and older to request care online for non-urgent symptoms. For details visit [email or web address].   Also download the MyChart app! Go to the app store, search "MyChart", open the app, select [facility], and log in with your MyChart username and password.  Due to Covid, a mask is required upon entering the hospital/clinic. If you do not have a mask, one will be given to you upon arrival. For doctor visits, patients may have 1 support person aged 28 or older with them. For treatment visits, patients cannot have anyone with them due to current Covid guidelines and our immunocompromised population.

## 2021-03-07 NOTE — Progress Notes (Signed)
Per MD, Dr. [name], order: discontinue/do not give Compazine 10 mg PO prn today; patient received Zofran 8 mg PO once today.

## 2021-03-13 ENCOUNTER — Inpatient Hospital Stay (HOSPITAL_BASED_OUTPATIENT_CLINIC_OR_DEPARTMENT_OTHER): Payer: PPO | Admitting: Hospice and Palliative Medicine

## 2021-03-13 DIAGNOSIS — Z515 Encounter for palliative care: Secondary | ICD-10-CM

## 2021-03-13 NOTE — Progress Notes (Signed)
I did not reach patient by phone. Unable to leave VM due to full mailbox. Will reschedule.

## 2021-03-18 ENCOUNTER — Other Ambulatory Visit: Payer: PPO

## 2021-03-18 ENCOUNTER — Inpatient Hospital Stay: Payer: PPO

## 2021-03-18 ENCOUNTER — Other Ambulatory Visit: Payer: Self-pay

## 2021-03-18 ENCOUNTER — Other Ambulatory Visit: Payer: Self-pay | Admitting: Emergency Medicine

## 2021-03-18 DIAGNOSIS — C9200 Acute myeloblastic leukemia, not having achieved remission: Secondary | ICD-10-CM

## 2021-03-18 DIAGNOSIS — Z5111 Encounter for antineoplastic chemotherapy: Secondary | ICD-10-CM | POA: Diagnosis not present

## 2021-03-18 LAB — CBC WITH DIFFERENTIAL/PLATELET
Abs Immature Granulocytes: 0 10*3/uL (ref 0.00–0.07)
Basophils Absolute: 0 10*3/uL (ref 0.0–0.1)
Basophils Relative: 1 %
Eosinophils Absolute: 0.1 10*3/uL (ref 0.0–0.5)
Eosinophils Relative: 4 %
HCT: 39.3 % (ref 36.0–46.0)
Hemoglobin: 13.4 g/dL (ref 12.0–15.0)
Immature Granulocytes: 0 %
Lymphocytes Relative: 30 %
Lymphs Abs: 0.6 10*3/uL — ABNORMAL LOW (ref 0.7–4.0)
MCH: 28.6 pg (ref 26.0–34.0)
MCHC: 34.1 g/dL (ref 30.0–36.0)
MCV: 83.8 fL (ref 80.0–100.0)
Monocytes Absolute: 0.2 10*3/uL (ref 0.1–1.0)
Monocytes Relative: 10 %
Neutro Abs: 1.1 10*3/uL — ABNORMAL LOW (ref 1.7–7.7)
Neutrophils Relative %: 55 %
Platelets: 94 10*3/uL — ABNORMAL LOW (ref 150–400)
RBC: 4.69 MIL/uL (ref 3.87–5.11)
RDW: 15.2 % (ref 11.5–15.5)
WBC: 2 10*3/uL — ABNORMAL LOW (ref 4.0–10.5)
nRBC: 0 % (ref 0.0–0.2)

## 2021-03-18 LAB — COMPREHENSIVE METABOLIC PANEL
ALT: 21 U/L (ref 0–44)
AST: 14 U/L — ABNORMAL LOW (ref 15–41)
Albumin: 3.6 g/dL (ref 3.5–5.0)
Alkaline Phosphatase: 110 U/L (ref 38–126)
Anion gap: 10 (ref 5–15)
BUN: 15 mg/dL (ref 8–23)
CO2: 26 mmol/L (ref 22–32)
Calcium: 8.8 mg/dL — ABNORMAL LOW (ref 8.9–10.3)
Chloride: 105 mmol/L (ref 98–111)
Creatinine, Ser: 0.96 mg/dL (ref 0.44–1.00)
GFR, Estimated: >60 mL/min (ref >60)
Glucose, Bld: 91 mg/dL (ref 70–99)
Potassium: 3.8 mmol/L (ref 3.5–5.1)
Sodium: 141 mmol/L (ref 135–145)
Total Bilirubin: 0.9 mg/dL (ref 0.3–1.2)
Total Protein: 6.9 g/dL (ref 6.5–8.1)

## 2021-03-18 LAB — MAGNESIUM: Magnesium: 1.6 mg/dL — ABNORMAL LOW (ref 1.7–2.4)

## 2021-03-18 LAB — SAMPLE TO BLOOD BANK

## 2021-03-31 ENCOUNTER — Inpatient Hospital Stay: Payer: PPO | Admitting: Oncology

## 2021-03-31 ENCOUNTER — Other Ambulatory Visit: Payer: Self-pay

## 2021-03-31 ENCOUNTER — Other Ambulatory Visit: Payer: Self-pay | Admitting: Emergency Medicine

## 2021-03-31 ENCOUNTER — Inpatient Hospital Stay: Payer: PPO

## 2021-03-31 VITALS — BP 123/64 | HR 91 | Temp 97.2°F | Resp 18

## 2021-03-31 DIAGNOSIS — C9200 Acute myeloblastic leukemia, not having achieved remission: Secondary | ICD-10-CM

## 2021-03-31 DIAGNOSIS — Z5111 Encounter for antineoplastic chemotherapy: Secondary | ICD-10-CM | POA: Diagnosis not present

## 2021-03-31 LAB — CBC WITH DIFFERENTIAL/PLATELET
Abs Immature Granulocytes: 0.01 10*3/uL (ref 0.00–0.07)
Basophils Absolute: 0 10*3/uL (ref 0.0–0.1)
Basophils Relative: 1 %
Eosinophils Absolute: 0 10*3/uL (ref 0.0–0.5)
Eosinophils Relative: 1 %
HCT: 40.1 % (ref 36.0–46.0)
Hemoglobin: 13.4 g/dL (ref 12.0–15.0)
Immature Granulocytes: 1 %
Lymphocytes Relative: 36 %
Lymphs Abs: 0.5 10*3/uL — ABNORMAL LOW (ref 0.7–4.0)
MCH: 28.4 pg (ref 26.0–34.0)
MCHC: 33.4 g/dL (ref 30.0–36.0)
MCV: 85 fL (ref 80.0–100.0)
Monocytes Absolute: 0.1 10*3/uL (ref 0.1–1.0)
Monocytes Relative: 6 %
Neutro Abs: 0.8 10*3/uL — ABNORMAL LOW (ref 1.7–7.7)
Neutrophils Relative %: 55 %
Platelets: 185 10*3/uL (ref 150–400)
RBC: 4.72 MIL/uL (ref 3.87–5.11)
RDW: 14.6 % (ref 11.5–15.5)
WBC: 1.5 10*3/uL — ABNORMAL LOW (ref 4.0–10.5)
nRBC: 0 % (ref 0.0–0.2)

## 2021-03-31 LAB — SAMPLE TO BLOOD BANK

## 2021-03-31 MED ORDER — ONDANSETRON HCL 4 MG PO TABS
8.0000 mg | ORAL_TABLET | Freq: Once | ORAL | Status: AC
  Administered 2021-03-31: 15:00:00 8 mg via ORAL
  Filled 2021-03-31: qty 2

## 2021-03-31 MED ORDER — AZACITIDINE CHEMO SQ INJECTION
50.0000 mg/m2 | Freq: Once | INTRAMUSCULAR | Status: AC
  Administered 2021-03-31: 15:00:00 87.5 mg via SUBCUTANEOUS
  Filled 2021-03-31: qty 3.5

## 2021-03-31 NOTE — Patient Instructions (Signed)
[facility]  Discharge Instructions: Thank you for choosing [facility] to provide your oncology and hematology care.  If you have a lab appointment with the [facility], please go directly to the [facility] and check in at the registration area.  Wear comfortable clothing and clothing appropriate for easy access to any Portacath or PICC line.   We strive to give you quality time with your provider. You may need to reschedule your appointment if you arrive late (15 or more minutes).  Arriving late affects you and other patients whose appointments are after yours.  Also, if you miss three or more appointments without notifying the office, you may be dismissed from the clinic at the provider's discretion.      For prescription refill requests, have your pharmacy contact our office and allow 72 hours for refills to be completed.    Today you received the following chemotherapy and/or immunotherapy agents VIDAZA      To help prevent nausea and vomiting after your treatment, we encourage you to take your nausea medication as directed.  BELOW ARE SYMPTOMS THAT SHOULD BE REPORTED IMMEDIATELY: *FEVER GREATER THAN 100.4 F (38 C) OR HIGHER *CHILLS OR SWEATING *NAUSEA AND VOMITING THAT IS NOT CONTROLLED WITH YOUR NAUSEA MEDICATION *UNUSUAL SHORTNESS OF BREATH *UNUSUAL BRUISING OR BLEEDING *URINARY PROBLEMS (pain or burning when urinating, or frequent urination) *BOWEL PROBLEMS (unusual diarrhea, constipation, pain near the anus) TENDERNESS IN MOUTH AND THROAT WITH OR WITHOUT PRESENCE OF ULCERS (sore throat, sores in mouth, or a toothache) UNUSUAL RASH, SWELLING OR PAIN  UNUSUAL VAGINAL DISCHARGE OR ITCHING   Items with * indicate a potential emergency and should be followed up as soon as possible or go to the Emergency Department if any problems should occur.  Please show the CHEMOTHERAPY ALERT CARD or IMMUNOTHERAPY ALERT CARD at check-in to  the Emergency Department and triage nurse.  Should you have questions after your visit or need to cancel or reschedule your appointment, please contact [facility]  [phone] and follow the prompts.  Office hours are 8:00 a.m. to 4:30 p.m. Monday - Friday. Please note that voicemails left after 4:00 p.m. may not be returned until the following business day.  We are closed weekends and major holidays. You have access to a nurse at all times for urgent questions. Please call the main number to the clinic [phone] and follow the prompts.  For any non-urgent questions, you may also contact your provider using MyChart. We now offer e-Visits for anyone 82 and older to request care online for non-urgent symptoms. For details visit [email or web address].   Also download the MyChart app! Go to the app store, search "MyChart", open the app, select [facility], and log in with your MyChart username and password.  Due to Covid, a mask is required upon entering the hospital/clinic. If you do not have a mask, one will be given to you upon arrival. For doctor visits, patients may have 1 support person aged 15 or older with them. For treatment visits, patients cannot have anyone with them due to current Covid guidelines and our immunocompromised population. .  Azacitidine suspension for injection (subcutaneous use) What is this medication? AZACITIDINE (ay Hayes) is a chemotherapy drug. This medicine reduces the growth of cancer cells and can suppress the immune system. It is used for treating myelodysplastic syndrome or some types of leukemia. This medicine may be used for other purposes; ask  your health care provider or pharmacist if you have questions. COMMON BRAND NAME(S): Vidaza What should I tell my care team before I take this medication? They need to know if you have any of these conditions: kidney disease liver disease liver tumors an unusual or  allergic reaction to azacitidine, mannitol, other medicines, foods, dyes, or preservatives pregnant or trying to get pregnant breast-feeding How should I use this medication? This medicine is for injection under the skin. It is administered in a hospital or clinic by a specially trained health care professional. Talk to your pediatrician regarding the use of this medicine in children. While this drug may be prescribed for selected conditions, precautions do apply. Overdosage: If you think you have taken too much of this medicine contact a poison control center or emergency room at once. NOTE: This medicine is only for you. Do not share this medicine with others. What if I miss a dose? It is important not to miss your dose. Call your doctor or health care professional if you are unable to keep an appointment. What may interact with this medication? Interactions have not been studied. Give your health care provider a list of all the medicines, herbs, non-prescription drugs, or dietary supplements you use. Also tell them if you smoke, drink alcohol, or use illegal drugs. Some items may interact with your medicine. This list may not describe all possible interactions. Give your health care provider a list of all the medicines, herbs, non-prescription drugs, or dietary supplements you use. Also tell them if you smoke, drink alcohol, or use illegal drugs. Some items may interact with your medicine. What should I watch for while using this medication? Visit your doctor for checks on your progress. This drug may make you feel generally unwell. This is not uncommon, as chemotherapy can affect healthy cells as well as cancer cells. Report any side effects. Continue your course of treatment even though you feel ill unless your doctor tells you to stop. In some cases, you may be given additional medicines to help with side effects. Follow all directions for their use. Call your doctor or health care  professional for advice if you get a fever, chills or sore throat, or other symptoms of a cold or flu. Do not treat yourself. This drug decreases your body's ability to fight infections. Try to avoid being around people who are sick. This medicine may increase your risk to bruise or bleed. Call your doctor or health care professional if you notice any unusual bleeding. You may need blood work done while you are taking this medicine. Do not become pregnant while taking this medicine and for 6 months after the last dose. Women should inform their doctor if they wish to become pregnant or think they might be pregnant. Men should not father a child while taking this medicine and for 3 months after the last dose. There is a potential for serious side effects to an unborn child. Talk to your health care professional or pharmacist for more information. Do not breast-feed an infant while taking this medicine and for 1 week after the last dose. This medicine may interfere with the ability to have a child. Talk with your doctor or health care professional if you are concerned about your fertility. What side effects may I notice from receiving this medication? Side effects that you should report to your doctor or health care professional as soon as possible: allergic reactions like skin rash, itching or hives, swelling of the face, lips,  or tongue low blood counts - this medicine may decrease the number of white blood cells, red blood cells and platelets. You may be at increased risk for infections and bleeding. signs of infection - fever or chills, cough, sore throat, pain passing urine signs of decreased platelets or bleeding - bruising, pinpoint red spots on the skin, black, tarry stools, blood in the urine signs of decreased red blood cells - unusually weak or tired, fainting spells, lightheadedness signs and symptoms of kidney injury like trouble passing urine or change in the amount of urine signs and  symptoms of liver injury like dark yellow or brown urine; general ill feeling or flu-like symptoms; light-colored stools; loss of appetite; nausea; right upper belly pain; unusually weak or tired; yellowing of the eyes or skin Side effects that usually do not require medical attention (report to your doctor or health care professional if they continue or are bothersome): constipation diarrhea nausea, vomiting pain or redness at the injection site unusually weak or tired This list may not describe all possible side effects. Call your doctor for medical advice about side effects. You may report side effects to FDA at 1-800-FDA-1088. Where should I keep my medication? This drug is given in a hospital or clinic and will not be stored at home. NOTE: This sheet is a summary. It may not cover all possible information. If you have questions about this medicine, talk to your doctor, pharmacist, or health care provider.  2022 Elsevier/Gold Standard (2016-05-20 00:00:00)

## 2021-03-31 NOTE — Progress Notes (Signed)
Per MD ok to use creatine from 11//18/22 for today Vidaza

## 2021-03-31 NOTE — Progress Notes (Signed)
Debra Hale  [phone] Fax:(336) [phone]  ID: Debra Hale OB: 06-30-1950  MR#: [id number]  CSN#:[id number]  Patient Care Team: [name], MD as PCP - General (Internal Medicine) Wellington Hampshire, MD as PCP - Cardiology (Cardiology)  CHIEF COMPLAINT: AML.  INTERVAL HISTORY: Patient returns to clinic today for further evaluation and continuation of maintenance Vidaza.  She has chronic weakness and fatigue, but otherwise is tolerating her treatments well without significant side effects.  She denies any pain.  She has no neurologic complaints.  She denies any recent fevers or illnesses. She has no chest pain, shortness of breath, cough, or hemoptysis.  She denies any nausea, vomiting, constipation, or diarrhea.  She has no urinary complaints.  Patient offers no further specific complaints today.  REVIEW OF SYSTEMS:   Review of Systems  Constitutional:  Positive for malaise/fatigue. Negative for fever and weight loss.  Respiratory: Negative.  Negative for cough, hemoptysis and shortness of breath.   Cardiovascular: Negative.  Negative for chest pain and leg swelling.  Gastrointestinal:  Positive for diarrhea. Negative for abdominal pain.  Genitourinary: Negative.  Negative for dysuria.  Musculoskeletal: Negative.  Negative for back pain.  Skin: Negative.  Negative for rash.  Neurological:  Positive for weakness. Negative for dizziness, focal weakness and headaches.  Psychiatric/Behavioral: Negative.  Negative for memory loss. The patient is not nervous/anxious.    As per HPI. Otherwise, a complete review of systems is negative.  PAST MEDICAL HISTORY: Past Medical History:  Diagnosis Date   Anxiety    Arthritis    Osteoarthritis   Breast cancer ([facility]) 2009   right breast lumpectomy with rad tx   Colon cancer ([place])    surgery with chemo and rad tx   Complication of anesthesia    GERD (gastroesophageal reflux disease)    Heart palpitations     History of hiatal hernia    History of kidney stones    Hypothyroidism    Liver disease    Liver nodule    s/p negative biopsy   Malignant neoplasm of thyroid gland ([place]) 2002   s/p surgery and XRT   Osteoporosis    Other and unspecified hyperlipidemia    Palpitations    Personal history of chemotherapy    Personal history of malignant neoplasm of large intestine    carcinoma - cecum, s/p right laparoscopic colectomy - s/p chemotherapy and XRT   Personal history of radiation therapy    Pneumonia 2019   PONV (postoperative nausea and vomiting)    Pure hypercholesterolemia    Unspecified hereditary and idiopathic peripheral neuropathy     PAST SURGICAL HISTORY: Past Surgical History:  Procedure Laterality Date   APPENDECTOMY  1985   BREAST BIOPSY Right 2009   positive   BREAST BIOPSY Right 2009   negative   BREAST LUMPECTOMY Right 2009   breast cancer   CHOLECYSTECTOMY  1995   COLONOSCOPY     COLONOSCOPY WITH PROPOFOL N/A 10/29/2017   Procedure: COLONOSCOPY WITH PROPOFOL;  Surgeon: Manya Silvas, MD;  Location: [facility] ENDOSCOPY;  Service: Endoscopy;  Laterality: N/A;   DILATION AND CURETTAGE OF UTERUS  1990   DILATION AND CURETTAGE, DIAGNOSTIC / THERAPEUTIC  1990   ESOPHAGOGASTRODUODENOSCOPY     ESOPHAGOGASTRODUODENOSCOPY (EGD) WITH PROPOFOL N/A 10/29/2017   Procedure: ESOPHAGOGASTRODUODENOSCOPY (EGD) WITH PROPOFOL;  Surgeon: Manya Silvas, MD;  Location: [facility] ENDOSCOPY;  Service: Endoscopy;  Laterality: N/A;   INCISION AND DRAINAGE PERIRECTAL ABSCESS N/A 06/17/2020   Procedure:  IRRIGATION AND DEBRIDEMENT PERIRECTAL ABSCESS;  Surgeon: Jules Husbands, MD;  Location: [facility] ORS;  Service: General;  Laterality: N/A;   LAPAROSCOPIC PARTIAL COLECTOMY     stage 3-C carcinoma of the cecum, s/p chemotherapy and xrt   LITHOTRIPSY     SIGMOIDOSCOPY  08/26/1993   THYROID LOBECTOMY  2002   s/p XRT   TOTAL HIP ARTHROPLASTY Right 10/24/2019   Procedure: TOTAL HIP ARTHROPLASTY;   Surgeon: Corky Mull, MD;  Location: [facility] ORS;  Service: Orthopedics;  Laterality: Right;    FAMILY HISTORY: Family History  Problem Relation Age of Onset   Stroke Mother        59s   Alzheimer's disease Mother    Cancer Mother    Lung cancer Father    Prostate cancer Father    Cancer Father        Colon   Colon cancer Father    Breast cancer Sister        69's   Lung cancer Sister    Breast cancer Maternal Aunt     ADVANCED DIRECTIVES (Y/N):  N  HEALTH MAINTENANCE: Social History   Tobacco Use   Smoking status: Never   Smokeless tobacco: Never  Vaping Use   Vaping Use: Never used  Substance Use Topics   Alcohol use: No    Alcohol/week: 0.0 standard drinks   Drug use: No     Colonoscopy:  PAP:  Bone density:  Lipid panel:  Allergies  Allergen Reactions   Demeclocycline Other (See Comments)    Throat swells   Sulfa Antibiotics Other (See Comments)    Other reaction(s): Other (See Comments) Throat swells Other reaction(s): Other (See Comments) Throat swells   Tetracyclines & Related Other (See Comments)    Throat swells    Augmentin [Amoxicillin-Pot Clavulanate] Diarrhea   Bentyl [Dicyclomine Hcl]     unkn   Ciprofloxacin Diarrhea   Codeine Other (See Comments)    dizziness     Dicyclomine Hcl Other (See Comments)    unkn   Epinephrine     Speeds heart up - panic    Flagyl [Metronidazole] Nausea And Vomiting   Librax [Chlordiazepoxide-Clidinium]     unkn   Novocain [Procaine] Other (See Comments)    "Shaky"   Phenobarbital     unkn   Prednisone     Nervous     Ultram [Tramadol] Other (See Comments)    Sick feeling    Current Outpatient Medications  Medication Sig Dispense Refill   Azelastine-Fluticasone 137-50 MCG/ACT SUSP SPRAY 2 SPRAYS INTO EACH NOSTRIL EVERY DAY 23 g 2   clonazePAM (KLONOPIN) 0.5 MG tablet Take 1.5 mg by mouth 2 (two) times daily.   0   diphenoxylate-atropine (LOMOTIL) 2.5-0.025 MG tablet Take 1 tablet by mouth 4  (four) times daily as needed for diarrhea or loose stools. 120 tablet 1   escitalopram (LEXAPRO) 20 MG tablet Take 20 mg by mouth every morning.      levothyroxine (SYNTHROID) 125 MCG tablet Take 125 mcg by mouth daily before breakfast.     loperamide (IMODIUM) 2 MG capsule Take by mouth.     magnesium oxide (MAG-OX) 400 MG tablet TAKE 1 TABLET BY MOUTH DAILY 30 tablet 0   metoprolol tartrate (LOPRESSOR) 25 MG tablet TAKE 1 TABLET (25 MG TOTAL) BY MOUTH AS NEEDED (AS NEEDED UP TO TWICE DAILY FOR PALPITATIONS). 60 tablet 1   pantoprazole (PROTONIX) 40 MG tablet TAKE ONE TABLET (40 MG) BY MOUTH EVERY  DAY 90 tablet 2   potassium chloride SA (KLOR-CON) 20 MEQ tablet TAKE ONE (1) TABLET BY MOUTH TWO TIMES PER DAY 60 tablet 0   prochlorperazine (COMPAZINE) 10 MG tablet Take 1 tablet (10 mg total) by mouth every 6 (six) hours as needed for refractory nausea / vomiting. 30 tablet 0   valACYclovir (VALTREX) 500 MG tablet Take 1 tablet by mouth daily.     Current Facility-Administered Medications  Medication Dose Route Frequency Provider Last Rate Last Admin   ondansetron (ZOFRAN) tablet 8 mg  8 mg Oral Once Guse, Jacquelynn Cree, FNP       Facility-Administered Medications Ordered in Other Visits  Medication Dose Route Frequency Provider Last Rate Last Admin   potassium chloride (KLOR-CON) CR tablet 40 mEq  40 mEq Oral BID Lloyd Huger, MD   40 mEq at 07/08/20 1136    OBJECTIVE: Vitals:   04/01/21 1355  BP: 123/69  Pulse: 76  Resp: 16  Temp: 97.6 F (36.4 C)  SpO2: 99%     Body mass index is 21.92 kg/m.    ECOG FS:0 - Asymptomatic  General: Well-developed, well-nourished, no acute distress. Eyes: Pink conjunctiva, anicteric sclera. HEENT: Normocephalic, moist mucous membranes. Lungs: No audible wheezing or coughing. Heart: Regular rate and rhythm. Abdomen: Soft, nontender, no obvious distention. Musculoskeletal: No edema, cyanosis, or clubbing. Neuro: Alert, answering all questions  appropriately. Cranial nerves grossly intact. Skin: No rashes or petechiae noted. Psych: Normal affect.   LAB RESULTS:  Lab Results  Component Value Date   NA 140 04/01/2021   K 3.6 04/01/2021   CL 104 04/01/2021   CO2 26 04/01/2021   GLUCOSE 100 (H) 04/01/2021   BUN 17 04/01/2021   CREATININE 1.04 (H) 04/01/2021   CALCIUM 8.8 (L) 04/01/2021   PROT 6.6 04/01/2021   ALBUMIN 3.6 04/01/2021   AST 11 (L) 04/01/2021   ALT 17 04/01/2021   ALKPHOS 109 04/01/2021   BILITOT 0.7 04/01/2021   GFRNONAA 57 (L) 04/01/2021   GFRAA 54 (L) 10/26/2019    Lab Results  Component Value Date   WBC 1.5 (L) 03/31/2021   NEUTROABS 0.8 (L) 03/31/2021   HGB 13.4 03/31/2021   HCT 40.1 03/31/2021   MCV 85.0 03/31/2021   PLT 185 03/31/2021     STUDIES: No results found.   ASSESSMENT: AML.  PLAN:    1.  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  Her peripheral blood flow cytometry also revealed 6% aberrant myeloblasts.  Patient completed cycle 1 of induction therapy at [facility] with Vidaza and venetoclax.  She only received 5 days of Vidaza for cycle 2 recently which was interrupted secondary to significant diarrhea and a perirectal abscess.  Repeat bone marrow biopsy on January 02, 2021 did not reveal any overt disease, but cytogenetics remained positive likely indicating a very good partial remission.  Patient has been instructed to discontinue venetoclax.  Will continue maintenance treatment with Vidaza on days 1 through 5 every 28 days.  Proceed with cycle 11 of treatment today.  Return to clinic daily for the remainder of the week for 5 days only.  Patient will then return to clinic in 4 weeks for further evaluation and consideration of cycle 12.   2.  Neutropenia: Chronic and unchanged.  Bone marrow biopsy as above. 3.  Thrombocytopenia: Resolved.  Discontinue venetoclax as above.   4.  Perirectal abscess: Resolved.   5.  Diarrhea: Intermittent.  Continue Lomotil as needed. 6.  Supportive care:  Patient  has been given referrals to home health and home palliative care.  Appreciate palliative care input.    7.  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  Patient also received chemotherapy and likely received Adriamycin, but exact regimen is unknown.  She completed 5 years of hormonal therapy in approximately June 2015. Currently, she has no evidence of disease.  Her most recent mammogram on March 05, 2020 was reported as BI-RADS 1.  Repeat in November 2022. 8.  History of colon cancer: Patient also states that she received chemotherapy for this, possibly FOLFOX but again this is unknown.   Patient expressed understanding and was in agreement with this plan. She also understands that She can call clinic at any time with any questions, concerns, or complaints.    Cancer Staging  History of breast cancer Staging form: Breast, AJCC 7th Edition - Clinical stage from 01/07/2016: Stage IA (T1c, N0, M0) - Signed by Lloyd Huger, MD on 01/07/2016 Laterality: Right Estrogen receptor status: Positive Progesterone receptor status: Positive HER2 status: Negative   Lloyd Huger, MD   04/02/2021 5:18 AM

## 2021-04-01 ENCOUNTER — Inpatient Hospital Stay (HOSPITAL_BASED_OUTPATIENT_CLINIC_OR_DEPARTMENT_OTHER): Payer: PPO | Admitting: Oncology

## 2021-04-01 ENCOUNTER — Inpatient Hospital Stay: Payer: PPO

## 2021-04-01 VITALS — BP 123/69 | HR 76 | Temp 97.6°F | Resp 16 | Wt 127.7 lb

## 2021-04-01 DIAGNOSIS — C9200 Acute myeloblastic leukemia, not having achieved remission: Secondary | ICD-10-CM

## 2021-04-01 DIAGNOSIS — Z5111 Encounter for antineoplastic chemotherapy: Secondary | ICD-10-CM | POA: Diagnosis not present

## 2021-04-01 LAB — COMPREHENSIVE METABOLIC PANEL
ALT: 17 U/L (ref 0–44)
AST: 11 U/L — ABNORMAL LOW (ref 15–41)
Albumin: 3.6 g/dL (ref 3.5–5.0)
Alkaline Phosphatase: 109 U/L (ref 38–126)
Anion gap: 10 (ref 5–15)
BUN: 17 mg/dL (ref 8–23)
CO2: 26 mmol/L (ref 22–32)
Calcium: 8.8 mg/dL — ABNORMAL LOW (ref 8.9–10.3)
Chloride: 104 mmol/L (ref 98–111)
Creatinine, Ser: 1.04 mg/dL — ABNORMAL HIGH (ref 0.44–1.00)
GFR, Estimated: 57 mL/min — ABNORMAL LOW (ref >60)
Glucose, Bld: 100 mg/dL — ABNORMAL HIGH (ref 70–99)
Potassium: 3.6 mmol/L (ref 3.5–5.1)
Sodium: 140 mmol/L (ref 135–145)
Total Bilirubin: 0.7 mg/dL (ref 0.3–1.2)
Total Protein: 6.6 g/dL (ref 6.5–8.1)

## 2021-04-01 MED ORDER — AZACITIDINE CHEMO SQ INJECTION
50.0000 mg/m2 | Freq: Once | INTRAMUSCULAR | Status: AC
  Administered 2021-04-01: 87.5 mg via SUBCUTANEOUS
  Filled 2021-04-01: qty 3.5

## 2021-04-01 MED ORDER — ONDANSETRON HCL 4 MG PO TABS
8.0000 mg | ORAL_TABLET | Freq: Once | ORAL | Status: AC
  Administered 2021-04-01: 8 mg via ORAL

## 2021-04-01 NOTE — Progress Notes (Signed)
Pt reports diarrhea x 1 night relieved by immodium. No other concerns/complaints at this time.

## 2021-04-01 NOTE — Patient Instructions (Signed)
[facility]  Discharge Instructions: Thank you for choosing [facility] to provide your oncology and hematology care.  If you have a lab appointment with the [facility], please go directly to the [facility] and check in at the registration area.  Wear comfortable clothing and clothing appropriate for easy access to any Portacath or PICC line.   We strive to give you quality time with your provider. You may need to reschedule your appointment if you arrive late (15 or more minutes).  Arriving late affects you and other patients whose appointments are after yours.  Also, if you miss three or more appointments without notifying the office, you may be dismissed from the clinic at the provider's discretion.      For prescription refill requests, have your pharmacy contact our office and allow 72 hours for refills to be completed.    Today you received the following chemotherapy and/or immunotherapy agents Vidaza      To help prevent nausea and vomiting after your treatment, we encourage you to take your nausea medication as directed.  BELOW ARE SYMPTOMS THAT SHOULD BE REPORTED IMMEDIATELY: *FEVER GREATER THAN 100.4 F (38 C) OR HIGHER *CHILLS OR SWEATING *NAUSEA AND VOMITING THAT IS NOT CONTROLLED WITH YOUR NAUSEA MEDICATION *UNUSUAL SHORTNESS OF BREATH *UNUSUAL BRUISING OR BLEEDING *URINARY PROBLEMS (pain or burning when urinating, or frequent urination) *BOWEL PROBLEMS (unusual diarrhea, constipation, pain near the anus) TENDERNESS IN MOUTH AND THROAT WITH OR WITHOUT PRESENCE OF ULCERS (sore throat, sores in mouth, or a toothache) UNUSUAL RASH, SWELLING OR PAIN  UNUSUAL VAGINAL DISCHARGE OR ITCHING   Items with * indicate a potential emergency and should be followed up as soon as possible or go to the Emergency Department if any problems should occur.  Please show the CHEMOTHERAPY ALERT CARD or IMMUNOTHERAPY ALERT CARD at check-in to  the Emergency Department and triage nurse.  Should you have questions after your visit or need to cancel or reschedule your appointment, please contact [facility]  [phone] and follow the prompts.  Office hours are 8:00 a.m. to 4:30 p.m. Monday - Friday. Please note that voicemails left after 4:00 p.m. may not be returned until the following business day.  We are closed weekends and major holidays. You have access to a nurse at all times for urgent questions. Please call the main number to the clinic [phone] and follow the prompts.  For any non-urgent questions, you may also contact your provider using MyChart. We now offer e-Visits for anyone 39 and older to request care online for non-urgent symptoms. For details visit [email or web address].   Also download the MyChart app! Go to the app store, search "MyChart", open the app, select [facility], and log in with your MyChart username and password.  Due to Covid, a mask is required upon entering the hospital/clinic. If you do not have a mask, one will be given to you upon arrival. For doctor visits, patients may have 1 support person aged 52 or older with them. For treatment visits, patients cannot have anyone with them due to current Covid guidelines and our immunocompromised population. Azacitidine suspension for injection (subcutaneous use) What is this medication? AZACITIDINE (ay Jackson) is a chemotherapy drug. This medicine reduces the growth of cancer cells and can suppress the immune system. It is used for treating myelodysplastic syndrome or some types of leukemia. This medicine may be used for other purposes; ask your health  care provider or pharmacist if you have questions. COMMON BRAND NAME(S): Vidaza What should I tell my care team before I take this medication? They need to know if you have any of these conditions: kidney disease liver disease liver tumors an unusual or allergic  reaction to azacitidine, mannitol, other medicines, foods, dyes, or preservatives pregnant or trying to get pregnant breast-feeding How should I use this medication? This medicine is for injection under the skin. It is administered in a hospital or clinic by a specially trained health care professional. Talk to your pediatrician regarding the use of this medicine in children. While this drug may be prescribed for selected conditions, precautions do apply. Overdosage: If you think you have taken too much of this medicine contact a poison control center or emergency room at once. NOTE: This medicine is only for you. Do not share this medicine with others. What if I miss a dose? It is important not to miss your dose. Call your doctor or health care professional if you are unable to keep an appointment. What may interact with this medication? Interactions have not been studied. Give your health care provider a list of all the medicines, herbs, non-prescription drugs, or dietary supplements you use. Also tell them if you smoke, drink alcohol, or use illegal drugs. Some items may interact with your medicine. This list may not describe all possible interactions. Give your health care provider a list of all the medicines, herbs, non-prescription drugs, or dietary supplements you use. Also tell them if you smoke, drink alcohol, or use illegal drugs. Some items may interact with your medicine. What should I watch for while using this medication? Visit your doctor for checks on your progress. This drug may make you feel generally unwell. This is not uncommon, as chemotherapy can affect healthy cells as well as cancer cells. Report any side effects. Continue your course of treatment even though you feel ill unless your doctor tells you to stop. In some cases, you may be given additional medicines to help with side effects. Follow all directions for their use. Call your doctor or health care professional for  advice if you get a fever, chills or sore throat, or other symptoms of a cold or flu. Do not treat yourself. This drug decreases your body's ability to fight infections. Try to avoid being around people who are sick. This medicine may increase your risk to bruise or bleed. Call your doctor or health care professional if you notice any unusual bleeding. You may need blood work done while you are taking this medicine. Do not become pregnant while taking this medicine and for 6 months after the last dose. Women should inform their doctor if they wish to become pregnant or think they might be pregnant. Men should not father a child while taking this medicine and for 3 months after the last dose. There is a potential for serious side effects to an unborn child. Talk to your health care professional or pharmacist for more information. Do not breast-feed an infant while taking this medicine and for 1 week after the last dose. This medicine may interfere with the ability to have a child. Talk with your doctor or health care professional if you are concerned about your fertility. What side effects may I notice from receiving this medication? Side effects that you should report to your doctor or health care professional as soon as possible: allergic reactions like skin rash, itching or hives, swelling of the face, lips, or tongue  low blood counts - this medicine may decrease the number of white blood cells, red blood cells and platelets. You may be at increased risk for infections and bleeding. signs of infection - fever or chills, cough, sore throat, pain passing urine signs of decreased platelets or bleeding - bruising, pinpoint red spots on the skin, black, tarry stools, blood in the urine signs of decreased red blood cells - unusually weak or tired, fainting spells, lightheadedness signs and symptoms of kidney injury like trouble passing urine or change in the amount of urine signs and symptoms of liver  injury like dark yellow or brown urine; general ill feeling or flu-like symptoms; light-colored stools; loss of appetite; nausea; right upper belly pain; unusually weak or tired; yellowing of the eyes or skin Side effects that usually do not require medical attention (report to your doctor or health care professional if they continue or are bothersome): constipation diarrhea nausea, vomiting pain or redness at the injection site unusually weak or tired This list may not describe all possible side effects. Call your doctor for medical advice about side effects. You may report side effects to FDA at 1-800-FDA-1088. Where should I keep my medication? This drug is given in a hospital or clinic and will not be stored at home. NOTE: This sheet is a summary. It may not cover all possible information. If you have questions about this medicine, talk to your doctor, pharmacist, or health care provider.  2022 Elsevier/Gold Standard (2016-05-20 00:00:00)

## 2021-04-02 ENCOUNTER — Encounter: Payer: Self-pay | Admitting: Oncology

## 2021-04-02 ENCOUNTER — Inpatient Hospital Stay: Payer: PPO

## 2021-04-02 ENCOUNTER — Other Ambulatory Visit: Payer: Self-pay

## 2021-04-02 VITALS — BP 124/65 | HR 81 | Temp 97.3°F | Resp 17

## 2021-04-02 DIAGNOSIS — C9200 Acute myeloblastic leukemia, not having achieved remission: Secondary | ICD-10-CM

## 2021-04-02 DIAGNOSIS — Z5111 Encounter for antineoplastic chemotherapy: Secondary | ICD-10-CM | POA: Diagnosis not present

## 2021-04-02 MED ORDER — AZACITIDINE CHEMO SQ INJECTION
50.0000 mg/m2 | Freq: Once | INTRAMUSCULAR | Status: AC
  Administered 2021-04-02: 87.5 mg via SUBCUTANEOUS
  Filled 2021-04-02: qty 3.5

## 2021-04-02 MED ORDER — ONDANSETRON HCL 4 MG PO TABS
8.0000 mg | ORAL_TABLET | Freq: Once | ORAL | Status: AC
  Administered 2021-04-02: 8 mg via ORAL
  Filled 2021-04-02: qty 2

## 2021-04-02 NOTE — Patient Instructions (Signed)
[facility]  Discharge Instructions: Thank you for choosing [facility] to provide your oncology and hematology care.  If you have a lab appointment with the [facility], please go directly to the [facility] and check in at the registration area.  Wear comfortable clothing and clothing appropriate for easy access to any Portacath or PICC line.   We strive to give you quality time with your provider. You may need to reschedule your appointment if you arrive late (15 or more minutes).  Arriving late affects you and other patients whose appointments are after yours.  Also, if you miss three or more appointments without notifying the office, you may be dismissed from the clinic at the provider's discretion.      For prescription refill requests, have your pharmacy contact our office and allow 72 hours for refills to be completed.    Today you received the following chemotherapy and/or immunotherapy agents Vidaza       To help prevent nausea and vomiting after your treatment, we encourage you to take your nausea medication as directed.  BELOW ARE SYMPTOMS THAT SHOULD BE REPORTED IMMEDIATELY: *FEVER GREATER THAN 100.4 F (38 C) OR HIGHER *CHILLS OR SWEATING *NAUSEA AND VOMITING THAT IS NOT CONTROLLED WITH YOUR NAUSEA MEDICATION *UNUSUAL SHORTNESS OF BREATH *UNUSUAL BRUISING OR BLEEDING *URINARY PROBLEMS (pain or burning when urinating, or frequent urination) *BOWEL PROBLEMS (unusual diarrhea, constipation, pain near the anus) TENDERNESS IN MOUTH AND THROAT WITH OR WITHOUT PRESENCE OF ULCERS (sore throat, sores in mouth, or a toothache) UNUSUAL RASH, SWELLING OR PAIN  UNUSUAL VAGINAL DISCHARGE OR ITCHING   Items with * indicate a potential emergency and should be followed up as soon as possible or go to the Emergency Department if any problems should occur.  Please show the CHEMOTHERAPY ALERT CARD or IMMUNOTHERAPY ALERT CARD at check-in to  the Emergency Department and triage nurse.  Should you have questions after your visit or need to cancel or reschedule your appointment, please contact [facility]  [phone] and follow the prompts.  Office hours are 8:00 a.m. to 4:30 p.m. Monday - Friday. Please note that voicemails left after 4:00 p.m. may not be returned until the following business day.  We are closed weekends and major holidays. You have access to a nurse at all times for urgent questions. Please call the main number to the clinic [phone] and follow the prompts.  For any non-urgent questions, you may also contact your provider using MyChart. We now offer e-Visits for anyone 68 and older to request care online for non-urgent symptoms. For details visit [email or web address].   Also download the MyChart app! Go to the app store, search "MyChart", open the app, select [facility], and log in with your MyChart username and password.  Due to Covid, a mask is required upon entering the hospital/clinic. If you do not have a mask, one will be given to you upon arrival. For doctor visits, patients may have 1 support person aged 72 or older with them. For treatment visits, patients cannot have anyone with them due to current Covid guidelines and our immunocompromised population.

## 2021-04-03 ENCOUNTER — Inpatient Hospital Stay: Payer: PPO | Attending: Oncology

## 2021-04-03 VITALS — BP 145/71 | HR 83 | Temp 96.4°F | Resp 18

## 2021-04-03 DIAGNOSIS — C9200 Acute myeloblastic leukemia, not having achieved remission: Secondary | ICD-10-CM | POA: Diagnosis not present

## 2021-04-03 DIAGNOSIS — Z5111 Encounter for antineoplastic chemotherapy: Secondary | ICD-10-CM | POA: Insufficient documentation

## 2021-04-03 MED ORDER — AZACITIDINE CHEMO SQ INJECTION
50.0000 mg/m2 | Freq: Once | INTRAMUSCULAR | Status: AC
  Administered 2021-04-03: 87.5 mg via SUBCUTANEOUS
  Filled 2021-04-03: qty 3.5

## 2021-04-03 MED ORDER — ONDANSETRON HCL 4 MG PO TABS
8.0000 mg | ORAL_TABLET | Freq: Once | ORAL | Status: AC
  Administered 2021-04-03: 8 mg via ORAL
  Filled 2021-04-03: qty 2

## 2021-04-03 NOTE — Patient Instructions (Signed)
[facility] CANCER CTR AT [place]  Discharge Instructions: Thank you for choosing [facility] to provide your oncology and hematology care.  If you have a lab appointment with the [facility], please go directly to the [facility] and check in at the registration area.  Wear comfortable clothing and clothing appropriate for easy access to any Portacath or PICC line.   We strive to give you quality time with your provider. You may need to reschedule your appointment if you arrive late (15 or more minutes).  Arriving late affects you and other patients whose appointments are after yours.  Also, if you miss three or more appointments without notifying the office, you may be dismissed from the clinic at the provider's discretion.      For prescription refill requests, have your pharmacy contact our office and allow 72 hours for refills to be completed.    Today you received the following chemotherapy and/or immunotherapy agents VIDAZA      To help prevent nausea and vomiting after your treatment, we encourage you to take your nausea medication as directed.  BELOW ARE SYMPTOMS THAT SHOULD BE REPORTED IMMEDIATELY: *FEVER GREATER THAN 100.4 F (38 C) OR HIGHER *CHILLS OR SWEATING *NAUSEA AND VOMITING THAT IS NOT CONTROLLED WITH YOUR NAUSEA MEDICATION *UNUSUAL SHORTNESS OF BREATH *UNUSUAL BRUISING OR BLEEDING *URINARY PROBLEMS (pain or burning when urinating, or frequent urination) *BOWEL PROBLEMS (unusual diarrhea, constipation, pain near the anus) TENDERNESS IN MOUTH AND THROAT WITH OR WITHOUT PRESENCE OF ULCERS (sore throat, sores in mouth, or a toothache) UNUSUAL RASH, SWELLING OR PAIN  UNUSUAL VAGINAL DISCHARGE OR ITCHING   Items with * indicate a potential emergency and should be followed up as soon as possible or go to the Emergency Department if any problems should occur.  Please show the CHEMOTHERAPY ALERT CARD or IMMUNOTHERAPY ALERT CARD at check-in to the  Emergency Department and triage nurse.  Should you have questions after your visit or need to cancel or reschedule your appointment, please contact [facility] CANCER [place] AT [place]  [phone] and follow the prompts.  Office hours are 8:00 a.m. to 4:30 p.m. Monday - Friday. Please note that voicemails left after 4:00 p.m. may not be returned until the following business day.  We are closed weekends and major holidays. You have access to a nurse at all times for urgent questions. Please call the main number to the clinic [phone] and follow the prompts.  For any non-urgent questions, you may also contact your provider using MyChart. We now offer e-Visits for anyone 74 and older to request care online for non-urgent symptoms. For details visit [email or web address].   Also download the MyChart app! Go to the app store, search "MyChart", open the app, select [place], and log in with your MyChart username and password.  Due to Covid, a mask is required upon entering the hospital/clinic. If you do not have a mask, one will be given to you upon arrival. For doctor visits, patients may have 1 support person aged 64 or older with them. For treatment visits, patients cannot have anyone with them due to current Covid guidelines and our immunocompromised population.   Azacitidine suspension for injection (subcutaneous use) What is this medication? AZACITIDINE (ay [place]) is a chemotherapy drug. This medicine reduces the growth of cancer cells and can suppress the immune system. It is used for treating myelodysplastic syndrome or some types of leukemia. This medicine may be used for other purposes; ask  your health care provider or pharmacist if you have questions. COMMON BRAND NAME(S): Vidaza What should I tell my care team before I take this medication? They need to know if you have any of these conditions: kidney disease liver disease liver tumors an unusual or allergic  reaction to azacitidine, mannitol, other medicines, foods, dyes, or preservatives pregnant or trying to get pregnant breast-feeding How should I use this medication? This medicine is for injection under the skin. It is administered in a hospital or clinic by a specially trained health care professional. Talk to your pediatrician regarding the use of this medicine in children. While this drug may be prescribed for selected conditions, precautions do apply. Overdosage: If you think you have taken too much of this medicine contact a poison control center or emergency room at once. NOTE: This medicine is only for you. Do not share this medicine with others. What if I miss a dose? It is important not to miss your dose. Call your doctor or health care professional if you are unable to keep an appointment. What may interact with this medication? Interactions have not been studied. Give your health care provider a list of all the medicines, herbs, non-prescription drugs, or dietary supplements you use. Also tell them if you smoke, drink alcohol, or use illegal drugs. Some items may interact with your medicine. This list may not describe all possible interactions. Give your health care provider a list of all the medicines, herbs, non-prescription drugs, or dietary supplements you use. Also tell them if you smoke, drink alcohol, or use illegal drugs. Some items may interact with your medicine. What should I watch for while using this medication? Visit your doctor for checks on your progress. This drug may make you feel generally unwell. This is not uncommon, as chemotherapy can affect healthy cells as well as cancer cells. Report any side effects. Continue your course of treatment even though you feel ill unless your doctor tells you to stop. In some cases, you may be given additional medicines to help with side effects. Follow all directions for their use. Call your doctor or health care professional for  advice if you get a fever, chills or sore throat, or other symptoms of a cold or flu. Do not treat yourself. This drug decreases your body's ability to fight infections. Try to avoid being around people who are sick. This medicine may increase your risk to bruise or bleed. Call your doctor or health care professional if you notice any unusual bleeding. You may need blood work done while you are taking this medicine. Do not become pregnant while taking this medicine and for 6 months after the last dose. Women should inform their doctor if they wish to become pregnant or think they might be pregnant. Men should not father a child while taking this medicine and for 3 months after the last dose. There is a potential for serious side effects to an unborn child. Talk to your health care professional or pharmacist for more information. Do not breast-feed an infant while taking this medicine and for 1 week after the last dose. This medicine may interfere with the ability to have a child. Talk with your doctor or health care professional if you are concerned about your fertility. What side effects may I notice from receiving this medication? Side effects that you should report to your doctor or health care professional as soon as possible: allergic reactions like skin rash, itching or hives, swelling of the face, lips,  or tongue low blood counts - this medicine may decrease the number of white blood cells, red blood cells and platelets. You may be at increased risk for infections and bleeding. signs of infection - fever or chills, cough, sore throat, pain passing urine signs of decreased platelets or bleeding - bruising, pinpoint red spots on the skin, black, tarry stools, blood in the urine signs of decreased red blood cells - unusually weak or tired, fainting spells, lightheadedness signs and symptoms of kidney injury like trouble passing urine or change in the amount of urine signs and symptoms of liver  injury like dark yellow or brown urine; general ill feeling or flu-like symptoms; light-colored stools; loss of appetite; nausea; right upper belly pain; unusually weak or tired; yellowing of the eyes or skin Side effects that usually do not require medical attention (report to your doctor or health care professional if they continue or are bothersome): constipation diarrhea nausea, vomiting pain or redness at the injection site unusually weak or tired This list may not describe all possible side effects. Call your doctor for medical advice about side effects. You may report side effects to FDA at 1-800-FDA-1088. Where should I keep my medication? This drug is given in a hospital or clinic and will not be stored at home. NOTE: This sheet is a summary. It may not cover all possible information. If you have questions about this medicine, talk to your doctor, pharmacist, or health care provider.  2022 Elsevier/Gold Standard (2016-05-20 00:00:00)

## 2021-04-04 ENCOUNTER — Other Ambulatory Visit: Payer: Self-pay

## 2021-04-04 ENCOUNTER — Inpatient Hospital Stay: Payer: PPO | Attending: Oncology

## 2021-04-04 VITALS — BP 135/75 | HR 69 | Temp 97.3°F | Resp 18

## 2021-04-04 DIAGNOSIS — C9200 Acute myeloblastic leukemia, not having achieved remission: Secondary | ICD-10-CM | POA: Diagnosis not present

## 2021-04-04 DIAGNOSIS — Z5111 Encounter for antineoplastic chemotherapy: Secondary | ICD-10-CM | POA: Insufficient documentation

## 2021-04-04 MED ORDER — ONDANSETRON HCL 4 MG PO TABS
8.0000 mg | ORAL_TABLET | Freq: Once | ORAL | Status: AC
  Administered 2021-04-04: 8 mg via ORAL
  Filled 2021-04-04: qty 2

## 2021-04-04 MED ORDER — AZACITIDINE CHEMO SQ INJECTION
50.0000 mg/m2 | Freq: Once | INTRAMUSCULAR | Status: AC
  Administered 2021-04-04: 87.5 mg via SUBCUTANEOUS
  Filled 2021-04-04: qty 3.5

## 2021-04-04 NOTE — Patient Instructions (Signed)
[facility] CANCER CTR AT [place]  Discharge Instructions: Thank you for choosing [facility] to provide your oncology and hematology care.  If you have a lab appointment with the [facility], please go directly to the [facility] and check in at the registration area.  Wear comfortable clothing and clothing appropriate for easy access to any Portacath or PICC line.   We strive to give you quality time with your provider. You may need to reschedule your appointment if you arrive late (15 or more minutes).  Arriving late affects you and other patients whose appointments are after yours.  Also, if you miss three or more appointments without notifying the office, you may be dismissed from the clinic at the provider's discretion.      For prescription refill requests, have your pharmacy contact our office and allow 72 hours for refills to be completed.    Today you received the following chemotherapy and/or immunotherapy agents VIDAZA      To help prevent nausea and vomiting after your treatment, we encourage you to take your nausea medication as directed.  BELOW ARE SYMPTOMS THAT SHOULD BE REPORTED IMMEDIATELY: *FEVER GREATER THAN 100.4 F (38 C) OR HIGHER *CHILLS OR SWEATING *NAUSEA AND VOMITING THAT IS NOT CONTROLLED WITH YOUR NAUSEA MEDICATION *UNUSUAL SHORTNESS OF BREATH *UNUSUAL BRUISING OR BLEEDING *URINARY PROBLEMS (pain or burning when urinating, or frequent urination) *BOWEL PROBLEMS (unusual diarrhea, constipation, pain near the anus) TENDERNESS IN MOUTH AND THROAT WITH OR WITHOUT PRESENCE OF ULCERS (sore throat, sores in mouth, or a toothache) UNUSUAL RASH, SWELLING OR PAIN  UNUSUAL VAGINAL DISCHARGE OR ITCHING   Items with * indicate a potential emergency and should be followed up as soon as possible or go to the Emergency Department if any problems should occur.  Please show the CHEMOTHERAPY ALERT CARD or IMMUNOTHERAPY ALERT CARD at check-in to the  Emergency Department and triage nurse.  Should you have questions after your visit or need to cancel or reschedule your appointment, please contact [facility] CANCER [place] AT [place]  [phone] and follow the prompts.  Office hours are 8:00 a.m. to 4:30 p.m. Monday - Friday. Please note that voicemails left after 4:00 p.m. may not be returned until the following business day.  We are closed weekends and major holidays. You have access to a nurse at all times for urgent questions. Please call the main number to the clinic [phone] and follow the prompts.  For any non-urgent questions, you may also contact your provider using MyChart. We now offer e-Visits for anyone 71 and older to request care online for non-urgent symptoms. For details visit [email or web address].   Also download the MyChart app! Go to the app store, search "MyChart", open the app, select [place], and log in with your MyChart username and password.  Due to Covid, a mask is required upon entering the hospital/clinic. If you do not have a mask, one will be given to you upon arrival. For doctor visits, patients may have 1 support person aged 34 or older with them. For treatment visits, patients cannot have anyone with them due to current Covid guidelines and our immunocompromised population.   Azacitidine suspension for injection (subcutaneous use) What is this medication? AZACITIDINE (ay [place]) is a chemotherapy drug. This medicine reduces the growth of cancer cells and can suppress the immune system. It is used for treating myelodysplastic syndrome or some types of leukemia. This medicine may be used for other purposes; ask  your health care provider or pharmacist if you have questions. COMMON BRAND NAME(S): Vidaza What should I tell my care team before I take this medication? They need to know if you have any of these conditions: kidney disease liver disease liver tumors an unusual or allergic  reaction to azacitidine, mannitol, other medicines, foods, dyes, or preservatives pregnant or trying to get pregnant breast-feeding How should I use this medication? This medicine is for injection under the skin. It is administered in a hospital or clinic by a specially trained health care professional. Talk to your pediatrician regarding the use of this medicine in children. While this drug may be prescribed for selected conditions, precautions do apply. Overdosage: If you think you have taken too much of this medicine contact a poison control center or emergency room at once. NOTE: This medicine is only for you. Do not share this medicine with others. What if I miss a dose? It is important not to miss your dose. Call your doctor or health care professional if you are unable to keep an appointment. What may interact with this medication? Interactions have not been studied. Give your health care provider a list of all the medicines, herbs, non-prescription drugs, or dietary supplements you use. Also tell them if you smoke, drink alcohol, or use illegal drugs. Some items may interact with your medicine. This list may not describe all possible interactions. Give your health care provider a list of all the medicines, herbs, non-prescription drugs, or dietary supplements you use. Also tell them if you smoke, drink alcohol, or use illegal drugs. Some items may interact with your medicine. What should I watch for while using this medication? Visit your doctor for checks on your progress. This drug may make you feel generally unwell. This is not uncommon, as chemotherapy can affect healthy cells as well as cancer cells. Report any side effects. Continue your course of treatment even though you feel ill unless your doctor tells you to stop. In some cases, you may be given additional medicines to help with side effects. Follow all directions for their use. Call your doctor or health care professional for  advice if you get a fever, chills or sore throat, or other symptoms of a cold or flu. Do not treat yourself. This drug decreases your body's ability to fight infections. Try to avoid being around people who are sick. This medicine may increase your risk to bruise or bleed. Call your doctor or health care professional if you notice any unusual bleeding. You may need blood work done while you are taking this medicine. Do not become pregnant while taking this medicine and for 6 months after the last dose. Women should inform their doctor if they wish to become pregnant or think they might be pregnant. Men should not father a child while taking this medicine and for 3 months after the last dose. There is a potential for serious side effects to an unborn child. Talk to your health care professional or pharmacist for more information. Do not breast-feed an infant while taking this medicine and for 1 week after the last dose. This medicine may interfere with the ability to have a child. Talk with your doctor or health care professional if you are concerned about your fertility. What side effects may I notice from receiving this medication? Side effects that you should report to your doctor or health care professional as soon as possible: allergic reactions like skin rash, itching or hives, swelling of the face, lips,  or tongue low blood counts - this medicine may decrease the number of white blood cells, red blood cells and platelets. You may be at increased risk for infections and bleeding. signs of infection - fever or chills, cough, sore throat, pain passing urine signs of decreased platelets or bleeding - bruising, pinpoint red spots on the skin, black, tarry stools, blood in the urine signs of decreased red blood cells - unusually weak or tired, fainting spells, lightheadedness signs and symptoms of kidney injury like trouble passing urine or change in the amount of urine signs and symptoms of liver  injury like dark yellow or brown urine; general ill feeling or flu-like symptoms; light-colored stools; loss of appetite; nausea; right upper belly pain; unusually weak or tired; yellowing of the eyes or skin Side effects that usually do not require medical attention (report to your doctor or health care professional if they continue or are bothersome): constipation diarrhea nausea, vomiting pain or redness at the injection site unusually weak or tired This list may not describe all possible side effects. Call your doctor for medical advice about side effects. You may report side effects to FDA at 1-800-FDA-1088. Where should I keep my medication? This drug is given in a hospital or clinic and will not be stored at home. NOTE: This sheet is a summary. It may not cover all possible information. If you have questions about this medicine, talk to your doctor, pharmacist, or health care provider.  2022 Elsevier/Gold Standard (2016-05-20 00:00:00)

## 2021-04-14 ENCOUNTER — Other Ambulatory Visit: Payer: Self-pay | Admitting: Internal Medicine

## 2021-04-15 ENCOUNTER — Ambulatory Visit: Payer: PPO | Admitting: Cardiovascular Disease

## 2021-04-15 ENCOUNTER — Encounter: Payer: Self-pay | Admitting: Cardiovascular Disease

## 2021-04-15 ENCOUNTER — Other Ambulatory Visit: Payer: Self-pay

## 2021-04-15 VITALS — BP 128/70 | HR 74 | Ht 64.0 in | Wt 129.2 lb

## 2021-04-15 DIAGNOSIS — R002 Palpitations: Secondary | ICD-10-CM

## 2021-04-15 DIAGNOSIS — Z8679 Personal history of other diseases of the circulatory system: Secondary | ICD-10-CM

## 2021-04-15 NOTE — Progress Notes (Signed)
Cardiology Office Note   Date:  04/15/2021   ID:  Debra Hale, DOB 04-20-1951, MRN [id number]  PCP:  [name], MD  Cardiologist:   Kathlyn Sacramento, MD   Chief Complaint  Patient presents with   Other    3 month f/u c/o palpitations. Meds reviewed verbally with pt.       History of Present Illness: Debra Hale is a 70 y.o. female who presents for a follow up visit recurrent pericarditis.  She has known history of hyperlipidemia, thyroid carcinoma s/p surgery and XRT, colon cancer s/p laparoscopic right colectomy, AML, nephrolithiasis, anxiety, osteoporosis and breast cancer. She had palpitations in the setting of hypokalemia and hypomagnesemia.  She had previous treadmill stress test in 2017 that showed no evidence of ischemia with poor exercise tolerance and hypertensive response to exercise.  She had recurrent pericarditis and previous small pericardial effusion since 2017.  She was hospitalized in February of this year due to perirectal abscess.  She was seen in April for increased palpitations.  Echocardiogram in May showed normal LV systolic function with no significant pulmonary hypertension. 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  Understandably, she had increased stress considering her health issues.  With that, she experiences palpitations but usually do not last long.  She has metoprolol to be used as needed but she has not taken the medication.  No chest pain or worsening dyspnea.   Past Medical History:  Diagnosis Date   Anxiety    Arthritis    Osteoarthritis   Breast cancer ([place]) 2009   right breast lumpectomy with rad tx   Colon cancer ([place])    surgery with chemo and rad tx   Complication of anesthesia    GERD (gastroesophageal reflux disease)    Heart palpitations    History of hiatal hernia    History of kidney stones    Hypothyroidism    Liver disease    Liver nodule    s/p negative biopsy   Malignant neoplasm of thyroid  gland ([place]) 2002   s/p surgery and XRT   Osteoporosis    Other and unspecified hyperlipidemia    Palpitations    Personal history of chemotherapy    Personal history of malignant neoplasm of large intestine    carcinoma - cecum, s/p right laparoscopic colectomy - s/p chemotherapy and XRT   Personal history of radiation therapy    Pneumonia 2019   PONV (postoperative nausea and vomiting)    Pure hypercholesterolemia    Unspecified hereditary and idiopathic peripheral neuropathy     Past Surgical History:  Procedure Laterality Date   APPENDECTOMY  1985   BREAST BIOPSY Right 2009   positive   BREAST BIOPSY Right 2009   negative   BREAST LUMPECTOMY Right 2009   breast cancer   CHOLECYSTECTOMY  1995   COLONOSCOPY     COLONOSCOPY WITH PROPOFOL N/A 10/29/2017   Procedure: COLONOSCOPY WITH PROPOFOL;  Surgeon: Manya Silvas, MD;  Location: [facility] ENDOSCOPY;  Service: Endoscopy;  Laterality: N/A;   DILATION AND CURETTAGE OF UTERUS  1990   DILATION AND CURETTAGE, DIAGNOSTIC / THERAPEUTIC  1990   ESOPHAGOGASTRODUODENOSCOPY     ESOPHAGOGASTRODUODENOSCOPY (EGD) WITH PROPOFOL N/A 10/29/2017   Procedure: ESOPHAGOGASTRODUODENOSCOPY (EGD) WITH PROPOFOL;  Surgeon: Manya Silvas, MD;  Location: [facility] ENDOSCOPY;  Service: Endoscopy;  Laterality: N/A;   INCISION AND DRAINAGE PERIRECTAL ABSCESS N/A 06/17/2020   Procedure: IRRIGATION AND DEBRIDEMENT PERIRECTAL ABSCESS;  Surgeon: Caroleen Hamman  F, MD;  Location: [facility] ORS;  Service: General;  Laterality: N/A;   LAPAROSCOPIC PARTIAL COLECTOMY     stage 3-C carcinoma of the cecum, s/p chemotherapy and xrt   LITHOTRIPSY     SIGMOIDOSCOPY  08/26/1993   THYROID LOBECTOMY  2002   s/p XRT   TOTAL HIP ARTHROPLASTY Right 10/24/2019   Procedure: TOTAL HIP ARTHROPLASTY;  Surgeon: Corky Mull, MD;  Location: [facility] ORS;  Service: Orthopedics;  Laterality: Right;     Current Outpatient Medications  Medication Sig Dispense Refill   Azelastine-Fluticasone  137-50 MCG/ACT SUSP SPRAY 2 SPRAYS INTO EACH NOSTRIL EVERY DAY 23 g 2   clonazePAM (KLONOPIN) 0.5 MG tablet Take 1.5 mg by mouth 2 (two) times daily.   0   diphenoxylate-atropine (LOMOTIL) 2.5-0.025 MG tablet Take 1 tablet by mouth 4 (four) times daily as needed for diarrhea or loose stools. 120 tablet 1   escitalopram (LEXAPRO) 20 MG tablet Take 20 mg by mouth every morning.      levothyroxine (SYNTHROID) 125 MCG tablet TAKE 1 TABLET EVERY DAY ON EMPTY STOMACHWITH A GLASS OF WATER AT LEAST 30-60 MINBEFORE BREAKFAST 90 tablet 1   loperamide (IMODIUM) 2 MG capsule Take by mouth.     magnesium oxide (MAG-OX) 400 MG tablet TAKE 1 TABLET BY MOUTH DAILY 30 tablet 0   metoprolol tartrate (LOPRESSOR) 25 MG tablet TAKE 1 TABLET (25 MG TOTAL) BY MOUTH AS NEEDED (AS NEEDED UP TO TWICE DAILY FOR PALPITATIONS). 60 tablet 1   pantoprazole (PROTONIX) 40 MG tablet TAKE ONE TABLET (40 MG) BY MOUTH EVERY DAY 90 tablet 2   potassium chloride SA (KLOR-CON) 20 MEQ tablet TAKE ONE (1) TABLET BY MOUTH TWO TIMES PER DAY 60 tablet 0   prochlorperazine (COMPAZINE) 10 MG tablet Take 1 tablet (10 mg total) by mouth every 6 (six) hours as needed for refractory nausea / vomiting. 30 tablet 0   valACYclovir (VALTREX) 500 MG tablet Take 1 tablet by mouth daily.     Current Facility-Administered Medications  Medication Dose Route Frequency Provider Last Rate Last Admin   ondansetron (ZOFRAN) tablet 8 mg  8 mg Oral Once Guse, Jacquelynn Cree, FNP       Facility-Administered Medications Ordered in Other Visits  Medication Dose Route Frequency Provider Last Rate Last Admin   potassium chloride (KLOR-CON) CR tablet 40 mEq  40 mEq Oral BID Lloyd Huger, MD   40 mEq at 07/08/20 1136    Allergies:   Demeclocycline, Sulfa antibiotics, Tetracyclines & related, Augmentin [amoxicillin-pot clavulanate], Bentyl [dicyclomine hcl], Ciprofloxacin, Codeine, Dicyclomine hcl, Epinephrine, Flagyl [metronidazole], Librax  [chlordiazepoxide-clidinium], Novocain [procaine], Phenobarbital, Prednisone, and Ultram [tramadol]    Social History:  The patient  reports that she has never smoked. She has never used smokeless tobacco. She reports that she does not drink alcohol and does not use drugs.   Family History:  The patient's family history includes Alzheimer's disease in her mother; Breast cancer in her maternal aunt and sister; Cancer in her father and mother; Colon cancer in her father; Lung cancer in her father and sister; Prostate cancer in her father; Stroke in her mother.    ROS:  Please see the history of present illness.   Otherwise, review of systems are positive for none.   All other systems are reviewed and negative.    PHYSICAL EXAM: VS:  BP 128/70 (BP Location: Left Arm, Patient Position: Sitting, Cuff Size: Normal)    Pulse 74    Ht 5\' 4"  (  1.626 m)    Wt 129 lb 4 oz (58.6 kg)    SpO2 98%    BMI 22.19 kg/m  , BMI Body mass index is 22.19 kg/m. GEN: Well nourished, well developed, in no acute distress  HEENT: normal  Neck: no JVD, carotid bruits, or masses Cardiac: RRR; no murmurs, rubs, or gallops,no edema  Respiratory:  clear to auscultation bilaterally, normal work of breathing GI: soft, nontender, nondistended, + BS MS: no deformity or atrophy  Skin: warm and dry, no rash Neuro:  Strength and sensation are intact Psych: euthymic mood, full affect   EKG:  EKG is  ordered today. EKG showed normal sinus rhythm with low voltage and nonspecific T wave changes.   Recent Labs: 10/02/2020: TSH 0.034 03/18/2021: Magnesium 1.6 03/31/2021: Hemoglobin 13.4; Platelets 185 04/01/2021: ALT 17; BUN 17; Creatinine, Ser 1.04; Potassium 3.6; Sodium 140    Lipid Panel    Component Value Date/Time   CHOL 163 02/20/2020 1045   TRIG 110.0 02/20/2020 1045   HDL 40.70 02/20/2020 1045   CHOLHDL 4 02/20/2020 1045   VLDL 22.0 02/20/2020 1045   LDLCALC 100 (H) 02/20/2020 1045   LDLDIRECT 126.0 07/11/2015  0956      Wt Readings from Last 3 Encounters:  04/15/21 129 lb 4 oz (58.6 kg)  04/01/21 127 lb 11.2 oz (57.9 kg)  03/04/21 129 lb 14.4 oz (58.9 kg)       ASSESSMENT AND PLAN:  1.  History of recurrent pericarditis: No recent symptoms.  She seems to be stable overall.  Most recent echocardiogram in May 2022 showed normal LV systolic function with no pericardial effusion.  2. Palpitations: No significant arrhythmia noted on her EKG.  Symptoms seem to be in the setting of increased stress and anxiety considering her medical issues.  She has metoprolol to be used as needed but she has not required the medication.    Disposition:   FU in 6 months  Signed,  Kathlyn Sacramento, MD  04/15/2021 3:11 PM    [place]

## 2021-04-15 NOTE — Patient Instructions (Signed)
Medication Instructions:  °Your physician recommends that you continue on your current medications as directed. Please refer to the Current Medication list given to you today. ° °*If you need a refill on your cardiac medications before your next appointment, please call your pharmacy* ° ° °Lab Work: °None ordered °If you have labs (blood work) drawn today and your tests are completely normal, you will receive your results only by: °MyChart Message (if you have MyChart) OR °A paper copy in the mail °If you have any lab test that is abnormal or we need to change your treatment, we will call you to review the results. ° ° °Testing/Procedures: °None ordered ° ° °Follow-Up: °At [facility], you and your health needs are our priority.  As part of our continuing mission to provide you with exceptional heart care, we have created designated Provider Care Teams.  These Care Teams include your primary Cardiologist (physician) and Advanced Practice Providers (APPs -  Physician Assistants and Nurse Practitioners) who all work together to provide you with the care you need, when you need it. ° °We recommend signing up for the patient portal called "MyChart".  Sign up information is provided on this After Visit Summary.  MyChart is used to connect with patients for Virtual Visits (Telemedicine).  Patients are able to view lab/test results, encounter notes, upcoming appointments, etc.  Non-urgent messages can be sent to your provider as well.   °To learn more about what you can do with MyChart, go to [email or web address].   ° °Your next appointment:   °6 month(s) ° °The format for your next appointment:   °In Person ° °Provider:   °You may see Muhammad Arida, MD or one of the following Advanced Practice Providers on your designated Care Team:   °Christopher Berge, NP °Ryan Dunn, PA-C °Cadence Furth, PA-C{ ° ° ° °Other Instructions °N/A ° °

## 2021-04-17 ENCOUNTER — Encounter
Admit: 2021-04-17 | Discharge: 2021-04-18 | Payer: PRIVATE HEALTH INSURANCE | Attending: Hematology | Primary: Hematology

## 2021-04-17 ENCOUNTER — Ambulatory Visit
Admit: 2021-04-17 | Discharge: 2021-04-18 | Payer: PRIVATE HEALTH INSURANCE | Attending: Hematology | Primary: Hematology

## 2021-04-17 ENCOUNTER — Other Ambulatory Visit: Admit: 2021-04-17 | Discharge: 2021-04-18 | Payer: PRIVATE HEALTH INSURANCE

## 2021-04-17 DIAGNOSIS — C9201 Acute myeloblastic leukemia, in remission: Secondary | ICD-10-CM | POA: Diagnosis not present

## 2021-04-17 DIAGNOSIS — C9200 Acute myeloblastic leukemia, not having achieved remission: Secondary | ICD-10-CM | POA: Diagnosis not present

## 2021-04-17 DIAGNOSIS — E039 Hypothyroidism, unspecified: Secondary | ICD-10-CM | POA: Diagnosis not present

## 2021-04-17 DIAGNOSIS — R197 Diarrhea, unspecified: Secondary | ICD-10-CM | POA: Diagnosis not present

## 2021-04-21 ENCOUNTER — Other Ambulatory Visit: Payer: PPO

## 2021-04-21 ENCOUNTER — Ambulatory Visit: Payer: PPO

## 2021-04-22 ENCOUNTER — Ambulatory Visit: Payer: PPO | Admitting: Oncology

## 2021-04-22 ENCOUNTER — Ambulatory Visit: Payer: PPO

## 2021-04-23 ENCOUNTER — Ambulatory Visit: Payer: PPO

## 2021-04-24 ENCOUNTER — Ambulatory Visit: Payer: PPO

## 2021-04-25 ENCOUNTER — Ambulatory Visit: Payer: PPO

## 2021-04-28 NOTE — Progress Notes (Deleted)
[place]  [phone] [phone]  ID: Debra Hale OB: 03-23-1951  MR#: [id number]  YTK#:[id number]  Patient Care Team: [name], MD as PCP - General (Internal Medicine) Wellington Hampshire, MD as PCP - Cardiology (Cardiology)  CHIEF COMPLAINT: AML.  INTERVAL HISTORY: Patient returns to clinic today for further evaluation and continuation of maintenance Vidaza.  She has chronic weakness and fatigue, but otherwise is tolerating her treatments well without significant side effects.  She denies any pain.  She has no neurologic complaints.  She denies any recent fevers or illnesses. She has no chest pain, shortness of breath, cough, or hemoptysis.  She denies any nausea, vomiting, constipation, or diarrhea.  She has no urinary complaints.  Patient offers no further specific complaints today.  REVIEW OF SYSTEMS:   Review of Systems  Constitutional:  Positive for malaise/fatigue. Negative for fever and weight loss.  Respiratory: Negative.  Negative for cough, hemoptysis and shortness of breath.   Cardiovascular: Negative.  Negative for chest pain and leg swelling.  Gastrointestinal:  Positive for diarrhea. Negative for abdominal pain.  Genitourinary: Negative.  Negative for dysuria.  Musculoskeletal: Negative.  Negative for back pain.  Skin: Negative.  Negative for rash.  Neurological:  Positive for weakness. Negative for dizziness, focal weakness and headaches.  Psychiatric/Behavioral: Negative.  Negative for memory loss. The patient is not nervous/anxious.    As per HPI. Otherwise, a complete review of systems is negative.  PAST MEDICAL HISTORY: Past Medical History:  Diagnosis Date   Anxiety    Arthritis    Osteoarthritis   Breast cancer ([place]) 2009   right breast lumpectomy with rad tx   Colon cancer ([place])    surgery with chemo and rad tx   Complication of anesthesia    GERD (gastroesophageal reflux disease)    Heart palpitations     History of hiatal hernia    History of kidney stones    Hypothyroidism    Liver disease    Liver nodule    s/p negative biopsy   Malignant neoplasm of thyroid gland ([place]) 2002   s/p surgery and XRT   Osteoporosis    Other and unspecified hyperlipidemia    Palpitations    Personal history of chemotherapy    Personal history of malignant neoplasm of large intestine    carcinoma - cecum, s/p right laparoscopic colectomy - s/p chemotherapy and XRT   Personal history of radiation therapy    Pneumonia 2019   PONV (postoperative nausea and vomiting)    Pure hypercholesterolemia    Unspecified hereditary and idiopathic peripheral neuropathy     PAST SURGICAL HISTORY: Past Surgical History:  Procedure Laterality Date   APPENDECTOMY  1985   BREAST BIOPSY Right 2009   positive   BREAST BIOPSY Right 2009   negative   BREAST LUMPECTOMY Right 2009   breast cancer   CHOLECYSTECTOMY  1995   COLONOSCOPY     COLONOSCOPY WITH PROPOFOL N/A 10/29/2017   Procedure: COLONOSCOPY WITH PROPOFOL;  Surgeon: Manya Silvas, MD;  Location: [facility] ENDOSCOPY;  Service: Endoscopy;  Laterality: N/A;   DILATION AND CURETTAGE OF UTERUS  1990   DILATION AND CURETTAGE, DIAGNOSTIC / THERAPEUTIC  1990   ESOPHAGOGASTRODUODENOSCOPY     ESOPHAGOGASTRODUODENOSCOPY (EGD) WITH PROPOFOL N/A 10/29/2017   Procedure: ESOPHAGOGASTRODUODENOSCOPY (EGD) WITH PROPOFOL;  Surgeon: Manya Silvas, MD;  Location: [facility] ENDOSCOPY;  Service: Endoscopy;  Laterality: N/A;   INCISION AND DRAINAGE PERIRECTAL ABSCESS N/A 06/17/2020   Procedure:  IRRIGATION AND DEBRIDEMENT PERIRECTAL ABSCESS;  Surgeon: Jules Husbands, MD;  Location: [facility] ORS;  Service: General;  Laterality: N/A;   LAPAROSCOPIC PARTIAL COLECTOMY     stage 3-C carcinoma of the cecum, s/p chemotherapy and xrt   LITHOTRIPSY     SIGMOIDOSCOPY  08/26/1993   THYROID LOBECTOMY  2002   s/p XRT   TOTAL HIP ARTHROPLASTY Right 10/24/2019   Procedure: TOTAL HIP ARTHROPLASTY;   Surgeon: Corky Mull, MD;  Location: [facility] ORS;  Service: Orthopedics;  Laterality: Right;    FAMILY HISTORY: Family History  Problem Relation Age of Onset   Stroke Mother        67s   Alzheimer's disease Mother    Cancer Mother    Lung cancer Father    Prostate cancer Father    Cancer Father        Colon   Colon cancer Father    Breast cancer Sister        63's   Lung cancer Sister    Breast cancer Maternal Aunt     ADVANCED DIRECTIVES (Y/N):  N  HEALTH MAINTENANCE: Social History   Tobacco Use   Smoking status: Never   Smokeless tobacco: Never  Vaping Use   Vaping Use: Never used  Substance Use Topics   Alcohol use: No    Alcohol/week: 0.0 standard drinks   Drug use: No     Colonoscopy:  PAP:  Bone density:  Lipid panel:  Allergies  Allergen Reactions   Demeclocycline Other (See Comments)    Throat swells   Sulfa Antibiotics Other (See Comments)    Other reaction(s): Other (See Comments) Throat swells Other reaction(s): Other (See Comments) Throat swells   Tetracyclines & Related Other (See Comments)    Throat swells    Augmentin [Amoxicillin-Pot Clavulanate] Diarrhea   Bentyl [Dicyclomine Hcl]     unkn   Ciprofloxacin Diarrhea   Codeine Other (See Comments)    dizziness     Dicyclomine Hcl Other (See Comments)    unkn   Epinephrine     Speeds heart up - panic    Flagyl [Metronidazole] Nausea And Vomiting   Librax [Chlordiazepoxide-Clidinium]     unkn   Novocain [Procaine] Other (See Comments)    "Shaky"   Phenobarbital     unkn   Prednisone     Nervous     Ultram [Tramadol] Other (See Comments)    Sick feeling    Current Outpatient Medications  Medication Sig Dispense Refill   Azelastine-Fluticasone 137-50 MCG/ACT SUSP SPRAY 2 SPRAYS INTO EACH NOSTRIL EVERY DAY 23 g 2   clonazePAM (KLONOPIN) 0.5 MG tablet Take 1.5 mg by mouth 2 (two) times daily.   0   diphenoxylate-atropine (LOMOTIL) 2.5-0.025 MG tablet Take 1 tablet by mouth 4  (four) times daily as needed for diarrhea or loose stools. 120 tablet 1   escitalopram (LEXAPRO) 20 MG tablet Take 20 mg by mouth every morning.      levothyroxine (SYNTHROID) 125 MCG tablet TAKE 1 TABLET EVERY DAY ON EMPTY STOMACHWITH A GLASS OF WATER AT LEAST 30-60 MINBEFORE BREAKFAST 90 tablet 1   loperamide (IMODIUM) 2 MG capsule Take by mouth.     magnesium oxide (MAG-OX) 400 MG tablet TAKE 1 TABLET BY MOUTH DAILY 30 tablet 0   metoprolol tartrate (LOPRESSOR) 25 MG tablet TAKE 1 TABLET (25 MG TOTAL) BY MOUTH AS NEEDED (AS NEEDED UP TO TWICE DAILY FOR PALPITATIONS). 60 tablet 1   pantoprazole (PROTONIX) 40  MG tablet TAKE ONE TABLET (40 MG) BY MOUTH EVERY DAY 90 tablet 2   potassium chloride SA (KLOR-CON) 20 MEQ tablet TAKE ONE (1) TABLET BY MOUTH TWO TIMES PER DAY 60 tablet 0   prochlorperazine (COMPAZINE) 10 MG tablet Take 1 tablet (10 mg total) by mouth every 6 (six) hours as needed for refractory nausea / vomiting. 30 tablet 0   valACYclovir (VALTREX) 500 MG tablet Take 1 tablet by mouth daily.     Current Facility-Administered Medications  Medication Dose Route Frequency Provider Last Rate Last Admin   ondansetron (ZOFRAN) tablet 8 mg  8 mg Oral Once Guse, Jacquelynn Cree, FNP       Facility-Administered Medications Ordered in Other Visits  Medication Dose Route Frequency Provider Last Rate Last Admin   potassium chloride (KLOR-CON) CR tablet 40 mEq  40 mEq Oral BID Lloyd Huger, MD   40 mEq at 07/08/20 1136    OBJECTIVE: There were no vitals filed for this visit.    There is no height or weight on file to calculate BMI.    ECOG FS:0 - Asymptomatic  General: Well-developed, well-nourished, no acute distress. Eyes: Pink conjunctiva, anicteric sclera. HEENT: Normocephalic, moist mucous membranes. Lungs: No audible wheezing or coughing. Heart: Regular rate and rhythm. Abdomen: Soft, nontender, no obvious distention. Musculoskeletal: No edema, cyanosis, or clubbing. Neuro: Alert,  answering all questions appropriately. Cranial nerves grossly intact. Skin: No rashes or petechiae noted. Psych: Normal affect.   LAB RESULTS:  Lab Results  Component Value Date   NA 140 04/01/2021   K 3.6 04/01/2021   CL 104 04/01/2021   CO2 26 04/01/2021   GLUCOSE 100 (H) 04/01/2021   BUN 17 04/01/2021   CREATININE 1.04 (H) 04/01/2021   CALCIUM 8.8 (L) 04/01/2021   PROT 6.6 04/01/2021   ALBUMIN 3.6 04/01/2021   AST 11 (L) 04/01/2021   ALT 17 04/01/2021   ALKPHOS 109 04/01/2021   BILITOT 0.7 04/01/2021   GFRNONAA 57 (L) 04/01/2021   GFRAA 54 (L) 10/26/2019    Lab Results  Component Value Date   WBC 1.5 (L) 03/31/2021   NEUTROABS 0.8 (L) 03/31/2021   HGB 13.4 03/31/2021   HCT 40.1 03/31/2021   MCV 85.0 03/31/2021   PLT 185 03/31/2021     STUDIES: No results found.   ASSESSMENT: AML.  PLAN:    1.  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  Her peripheral blood flow cytometry also revealed 6% aberrant myeloblasts.  Patient completed cycle 1 of induction therapy at [facility] with Vidaza and venetoclax.  She only received 5 days of Vidaza for cycle 2 recently which was interrupted secondary to significant diarrhea and a perirectal abscess.  Repeat bone marrow biopsy on January 02, 2021 did not reveal any overt disease, but cytogenetics remained positive likely indicating a very good partial remission.  Patient has been instructed to discontinue venetoclax.  Will continue maintenance treatment with Vidaza on days 1 through 5 every 28 days.  Proceed with cycle 11 of treatment today.  Return to clinic daily for the remainder of the week for 5 days only.  Patient will then return to clinic in 4 weeks for further evaluation and consideration of cycle 12.   2.  Neutropenia: Chronic and unchanged.  Bone marrow biopsy as above. 3.  Thrombocytopenia: Resolved.  Discontinue venetoclax as above.   4.  Perirectal abscess: Resolved.   5.  Diarrhea: Intermittent.  Continue Lomotil as  needed. 6.  Supportive care: Patient has  been given referrals to home health and home palliative care.  Appreciate palliative care input.    7.  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  Patient also received chemotherapy and likely received Adriamycin, but exact regimen is unknown.  She completed 5 years of hormonal therapy in approximately June 2015. Currently, she has no evidence of disease.  Her most recent mammogram on March 05, 2020 was reported as BI-RADS 1.  Repeat in November 2022. 8.  History of colon cancer: Patient also states that she received chemotherapy for this, possibly FOLFOX but again this is unknown.   Patient expressed understanding and was in agreement with this plan. She also understands that She can call clinic at any time with any questions, concerns, or complaints.    Cancer Staging  History of breast cancer Staging form: Breast, AJCC 7th Edition - Clinical stage from 01/07/2016: Stage IA (T1c, N0, M0) - Signed by Lloyd Huger, MD on 01/07/2016 Laterality: Right Estrogen receptor status: Positive Progesterone receptor status: Positive HER2 status: Negative   Lloyd Huger, MD   04/28/2021 8:51 AM

## 2021-04-29 ENCOUNTER — Encounter: Payer: Self-pay | Admitting: Emergency Medicine

## 2021-04-29 ENCOUNTER — Emergency Department
Admission: EM | Admit: 2021-04-29 | Discharge: 2021-04-29 | Disposition: A | Discharge status: Home / Self Care | Payer: PPO | Attending: Emergency Medicine | Admitting: Emergency Medicine

## 2021-04-29 ENCOUNTER — Inpatient Hospital Stay: Payer: PPO | Admitting: Oncology

## 2021-04-29 ENCOUNTER — Inpatient Hospital Stay: Payer: PPO

## 2021-04-29 ENCOUNTER — Emergency Department: Payer: PPO

## 2021-04-29 ENCOUNTER — Other Ambulatory Visit: Payer: Self-pay

## 2021-04-29 DIAGNOSIS — M1612 Unilateral primary osteoarthritis, left hip: Secondary | ICD-10-CM | POA: Diagnosis not present

## 2021-04-29 DIAGNOSIS — R600 Localized edema: Secondary | ICD-10-CM | POA: Diagnosis not present

## 2021-04-29 DIAGNOSIS — Z79899 Other long term (current) drug therapy: Secondary | ICD-10-CM | POA: Insufficient documentation

## 2021-04-29 DIAGNOSIS — S0990XA Unspecified injury of head, initial encounter: Secondary | ICD-10-CM | POA: Diagnosis not present

## 2021-04-29 DIAGNOSIS — Z96641 Presence of right artificial hip joint: Secondary | ICD-10-CM | POA: Diagnosis not present

## 2021-04-29 DIAGNOSIS — Z85038 Personal history of other malignant neoplasm of large intestine: Secondary | ICD-10-CM | POA: Insufficient documentation

## 2021-04-29 DIAGNOSIS — S7012XA Contusion of left thigh, initial encounter: Secondary | ICD-10-CM | POA: Diagnosis not present

## 2021-04-29 DIAGNOSIS — M542 Cervicalgia: Secondary | ICD-10-CM | POA: Diagnosis not present

## 2021-04-29 DIAGNOSIS — Z85850 Personal history of malignant neoplasm of thyroid: Secondary | ICD-10-CM | POA: Diagnosis not present

## 2021-04-29 DIAGNOSIS — S79912A Unspecified injury of left hip, initial encounter: Secondary | ICD-10-CM | POA: Diagnosis present

## 2021-04-29 DIAGNOSIS — E039 Hypothyroidism, unspecified: Secondary | ICD-10-CM | POA: Insufficient documentation

## 2021-04-29 DIAGNOSIS — M25552 Pain in left hip: Secondary | ICD-10-CM | POA: Diagnosis not present

## 2021-04-29 DIAGNOSIS — Z853 Personal history of malignant neoplasm of breast: Secondary | ICD-10-CM | POA: Diagnosis not present

## 2021-04-29 DIAGNOSIS — S7002XA Contusion of left hip, initial encounter: Secondary | ICD-10-CM | POA: Insufficient documentation

## 2021-04-29 DIAGNOSIS — W01198A Fall on same level from slipping, tripping and stumbling with subsequent striking against other object, initial encounter: Secondary | ICD-10-CM | POA: Insufficient documentation

## 2021-04-29 DIAGNOSIS — R42 Dizziness and giddiness: Secondary | ICD-10-CM | POA: Diagnosis not present

## 2021-04-29 DIAGNOSIS — W19XXXA Unspecified fall, initial encounter: Secondary | ICD-10-CM | POA: Diagnosis not present

## 2021-04-29 DIAGNOSIS — S0003XA Contusion of scalp, initial encounter: Secondary | ICD-10-CM | POA: Diagnosis not present

## 2021-04-29 DIAGNOSIS — Y92480 Sidewalk as the place of occurrence of the external cause: Secondary | ICD-10-CM | POA: Insufficient documentation

## 2021-04-29 DIAGNOSIS — R519 Headache, unspecified: Secondary | ICD-10-CM | POA: Diagnosis not present

## 2021-04-29 MED ORDER — OXYCODONE-ACETAMINOPHEN 5-325 MG PO TABS
1.0000 | ORAL_TABLET | ORAL | Status: AC
  Administered 2021-04-29: 15:00:00 1 via ORAL
  Filled 2021-04-29: qty 1

## 2021-04-29 MED ORDER — OXYCODONE-ACETAMINOPHEN 5-325 MG PO TABS: 1.0000 | ORAL_TABLET | Freq: Four times a day (QID) | ORAL | 0 refills | Status: DC | PRN

## 2021-04-29 NOTE — ED Provider Notes (Signed)
[facility] Emergency Department Provider Note  ____________________________________________   Event Date/Time   First MD Initiated Contact with Patient 04/29/21 1425     (approximate)  I have reviewed the triage vital signs and the nursing notes.   HISTORY  Chief Complaint Fall    HPI Debra Hale is a 70 y.o. female was walking to her car, she tripped on the curb.  She fell on her left side striking the left side of her scalp with a small amount of bruising and abrasion.  Also noting moderate pain in her left hip joint.  Said previous right hip surgery.  Reports a sharp pain over her left hip area worse with movement, points over the left greater trochanteric region.  Denies significant headache.  Did not lose consciousness.  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  Is a history of palpitations followed by cardiology but denies she had any sort of preceding palpitations chest pain or other event today  No confusion.  No numbness or weakness.  No loss of function.  Past Medical History:  Diagnosis Date   Anxiety    Arthritis    Osteoarthritis   Breast cancer ([place]) 2009   right breast lumpectomy with rad tx   Colon cancer ([place])    surgery with chemo and rad tx   Complication of anesthesia    GERD (gastroesophageal reflux disease)    Heart palpitations    History of hiatal hernia    History of kidney stones    Hypothyroidism    Liver disease    Liver nodule    s/p negative biopsy   Malignant neoplasm of thyroid gland ([place]) 2002   s/p surgery and XRT   Osteoporosis    Other and unspecified hyperlipidemia    Palpitations    Personal history of chemotherapy    Personal history of malignant neoplasm of large intestine    carcinoma - cecum, s/p right laparoscopic colectomy - s/p chemotherapy and XRT   Personal history of radiation therapy    Pneumonia 2019   PONV (postoperative nausea and vomiting)     Pure hypercholesterolemia    Unspecified hereditary and idiopathic peripheral neuropathy     Patient Active Problem List   Diagnosis Date Noted   Perirectal abscess 06/16/2020   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   Anemia 02/11/2020   Status post total hip replacement, 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   Rectal bleed 06/17/2019   Pleural effusion 12/03/2018   Right hip pain 12/03/2018   Leukopenia 08/21/2018   Change in vision 06/24/2018   Hypothyroidism 05/02/2018   Neck pain 05/02/2018   Nonintractable headache 04/18/2018   Thrush 04/18/2018   Pleural effusion associated with pulmonary infection 04/18/2018   Other fatigue 04/18/2018   Abdominal pain 03/06/2016   Acute diarrhea 01/10/2016   Neck nodule 10/13/2015   Vitamin D deficiency 07/27/2015   Loose stools 04/14/2015   Mild depression 02/02/2015   Health care maintenance 06/17/2014   Stress 01/28/2014   Acute pericarditis 05/31/2013   Environmental allergies 02/25/2013   Left elbow pain 02/25/2013   Abnormal liver function test 12/07/2012   Anxiety 05/06/2012   GERD (gastroesophageal reflux disease) 05/06/2012   Osteoporosis 05/06/2012   History of breast cancer 09/18/2008   History of thyroid cancer 09/18/2008   Hypercholesterolemia 09/18/2008   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   Palpitations 09/18/2008   History of malignant neoplasm of large intestine 09/18/2008    Past Surgical  History:  Procedure Laterality Date   APPENDECTOMY  1985   BREAST BIOPSY Right 2009   positive   BREAST BIOPSY Right 2009   negative   BREAST LUMPECTOMY Right 2009   breast cancer   CHOLECYSTECTOMY  1995   COLONOSCOPY     COLONOSCOPY WITH PROPOFOL N/A 10/29/2017   Procedure: COLONOSCOPY WITH PROPOFOL;  Surgeon: Manya Silvas, MD;  Location: [facility] ENDOSCOPY;  Service: Endoscopy;  Laterality: N/A;   DILATION AND CURETTAGE OF UTERUS  1990   DILATION AND CURETTAGE, DIAGNOSTIC / THERAPEUTIC  1990    ESOPHAGOGASTRODUODENOSCOPY     ESOPHAGOGASTRODUODENOSCOPY (EGD) WITH PROPOFOL N/A 10/29/2017   Procedure: ESOPHAGOGASTRODUODENOSCOPY (EGD) WITH PROPOFOL;  Surgeon: Manya Silvas, MD;  Location: [facility] ENDOSCOPY;  Service: Endoscopy;  Laterality: N/A;   INCISION AND DRAINAGE PERIRECTAL ABSCESS N/A 06/17/2020   Procedure: IRRIGATION AND DEBRIDEMENT PERIRECTAL ABSCESS;  Surgeon: Jules Husbands, MD;  Location: [facility] ORS;  Service: General;  Laterality: N/A;   LAPAROSCOPIC PARTIAL COLECTOMY     stage 3-C carcinoma of the cecum, s/p chemotherapy and xrt   LITHOTRIPSY     SIGMOIDOSCOPY  08/26/1993   THYROID LOBECTOMY  2002   s/p XRT   TOTAL HIP ARTHROPLASTY Right 10/24/2019   Procedure: TOTAL HIP ARTHROPLASTY;  Surgeon: Corky Mull, MD;  Location: [facility] ORS;  Service: Orthopedics;  Laterality: Right;    Prior to Admission medications   Medication Sig Start Date End Date Taking? Authorizing Provider  oxyCODONE-acetaminophen (PERCOCET/ROXICET) 5-325 MG tablet Take 1 tablet by mouth every 6 (six) hours as needed for severe pain. 04/29/21  Yes Delman Kitten, MD  Azelastine-Fluticasone 137-50 MCG/ACT SUSP SPRAY 2 SPRAYS INTO EACH NOSTRIL EVERY DAY 02/28/21   [name], MD  clonazePAM (KLONOPIN) 0.5 MG tablet Take 1.5 mg by mouth 2 (two) times daily.  09/06/17   [provider]  diphenoxylate-atropine (LOMOTIL) 2.5-0.025 MG tablet Take 1 tablet by mouth 4 (four) times daily as needed for diarrhea or loose stools. 03/04/21   Lloyd Huger, MD  escitalopram (LEXAPRO) 20 MG tablet Take 20 mg by mouth every morning.     [provider]  levothyroxine (SYNTHROID) 125 MCG tablet TAKE 1 TABLET EVERY DAY ON EMPTY STOMACHWITH A GLASS OF WATER AT LEAST 30-60 MINBEFORE BREAKFAST 04/14/21   [name], MD  loperamide (IMODIUM) 2 MG capsule Take by mouth.    [provider]  magnesium oxide (MAG-OX) 400 MG tablet TAKE 1 TABLET BY MOUTH DAILY 02/24/21   Lloyd Huger, MD   metoprolol tartrate (LOPRESSOR) 25 MG tablet TAKE 1 TABLET (25 MG TOTAL) BY MOUTH AS NEEDED (AS NEEDED UP TO TWICE DAILY FOR PALPITATIONS). 09/04/20   Loel Dubonnet, NP  pantoprazole (PROTONIX) 40 MG tablet TAKE ONE TABLET (40 MG) BY MOUTH EVERY DAY 12/20/20   [name], MD  potassium chloride SA (KLOR-CON) 20 MEQ tablet TAKE ONE (1) TABLET BY MOUTH TWO TIMES PER DAY 02/24/21   Lloyd Huger, MD  prochlorperazine (COMPAZINE) 10 MG tablet Take 1 tablet (10 mg total) by mouth every 6 (six) hours as needed for refractory nausea / vomiting. 02/07/21   Verlon Au, NP  valACYclovir (VALTREX) 500 MG tablet Take 1 tablet by mouth daily. 10/17/20   [provider]    Allergies Demeclocycline, Sulfa antibiotics, Tetracyclines & related, Augmentin [amoxicillin-pot clavulanate], Bentyl [dicyclomine hcl], Ciprofloxacin, Codeine, Dicyclomine hcl, Epinephrine, Flagyl [metronidazole], Librax [chlordiazepoxide-clidinium], Novocain [procaine], Phenobarbital, Prednisone, and Ultram [tramadol]  Family History  Problem Relation Age  of Onset   Stroke Mother        61s   Alzheimer's disease Mother    Cancer Mother    Lung cancer Father    Prostate cancer Father    Cancer Father        Colon   Colon cancer Father    Breast cancer Sister        106's   Lung cancer Sister    Breast cancer Maternal Aunt     Social History Social History   Tobacco Use   Smoking status: Never   Smokeless tobacco: Never  Vaping Use   Vaping Use: Never used  Substance Use Topics   Alcohol use: No    Alcohol/week: 0.0 standard drinks   Drug use: No    Review of Systems Constitutional: No fever/chills or recent illness other than her typical ongoing cancer treatments Eyes: No visual changes. ENT: No sore throat. Cardiovascular: Denies chest pain. Respiratory: Denies shortness of breath. Gastrointestinal: No abdominal pain.   Genitourinary: Negative for dysuria. Musculoskeletal: Negative for  back pain.  Pain over the left hip joint region.  Able to move it and put some weight on it but its considerably sore especially when she tries to walk on it. Skin: Negative for rash. Neurological: Negative for headaches, areas of focal weakness or numbness.    ____________________________________________   PHYSICAL EXAM:  VITAL SIGNS: ED Triage Vitals  Enc Vitals Group     BP 04/29/21 1344 140/81     Pulse Rate 04/29/21 1344 83     Resp 04/29/21 1344 18     Temp 04/29/21 1344 98.4 F (36.9 C)     Temp Source 04/29/21 1344 Oral     SpO2 04/29/21 1344 97 %     Weight 04/29/21 1233 129 lb 3 oz (58.6 kg)     Height 04/29/21 1233 5\' 4"  (1.626 m)     Head Circumference --      Peak Flow --      Pain Score 04/29/21 1232 2     Pain Loc --      Pain Edu? --      Excl. in [place]? --     Constitutional: Alert and oriented. Well appearing and in no acute distress. Eyes: Conjunctivae are normal. Head: Atraumatic subfloor of small abrasion with some very slight amount of induration over the left frontal bone. Nose: No congestion/rhinnorhea. Mouth/Throat: Mucous membranes are moist. Neck: No stridor.  No cervical tenderness. Cardiovascular: Normal rate, regular rhythm. Grossly normal heart sounds.  Good peripheral circulation. Respiratory: Normal respiratory effort.  No retractions. Lungs CTAB. Gastrointestinal: Soft and nontender. No distention. Musculoskeletal: No lower extremity tenderness nor edema except focal tenderness without bruising hematoma or deformity noted over the left hip trochanter.  No shortening or rotation.  Distal neurovascular intact Neurologic:  Normal speech and language. No gross focal neurologic deficits are appreciated.  Skin:  Skin is warm, dry and intact. No rash noted. Psychiatric: Mood and affect are normal. Speech and behavior are normal.  ____________________________________________   LABS (all labs ordered are listed, but only abnormal results are  displayed)  Labs Reviewed - No data to display ____________________________________________  EKG   ____________________________________________  RADIOLOGY  CT Head Wo Contrast  Result Date: 04/29/2021 CLINICAL DATA:  Tripped and fell, headache EXAM: CT HEAD WITHOUT CONTRAST TECHNIQUE: Contiguous axial images were obtained from the base of the skull through the vertex without intravenous contrast. COMPARISON:  Brain MRI 05/12/2018 FINDINGS: Brain: There  is no evidence of acute intracranial hemorrhage, extra-axial fluid collection, or acute infarct. Parenchymal volume is within normal limits. The ventricles are normal in size. There is mild chronic white matter microangiopathy. There is no mass lesion.  There is no midline shift. Vascular: There is calcification of the bilateral cavernous ICAs. Skull: Normal. Negative for fracture or focal lesion. Sinuses/Orbits: There is complete opacification of the imaged right maxillary sinus and mucosal thickening in the right anterior ethmoid air cells. Bilateral lens implants are in place. The globes and orbits are otherwise unremarkable. Other: None. IMPRESSION: No acute intracranial pathology. Electronically Signed   By: Valetta Mole M.D.   On: 04/29/2021 14:31   CT Cervical Spine Wo Contrast  Result Date: 04/29/2021 CLINICAL DATA:  Tripped and fell, neck pain EXAM: CT CERVICAL SPINE WITHOUT CONTRAST TECHNIQUE: Multidetector CT imaging of the cervical spine was performed without intravenous contrast. Multiplanar CT image reconstructions were also generated. COMPARISON:  None. FINDINGS: Alignment: Normal. There is no antero or retrolisthesis. There is no jumped or perched facets or other evidence of traumatic malalignment. Skull base and vertebrae: Skull base alignment is maintained. Vertebral body heights are preserved. There is no evidence of acute fracture. Soft tissues and spinal canal: No prevertebral fluid or swelling. No visible canal hematoma. Disc  levels: There is mild multilevel degenerative endplate change in the cervical spine. The osseous spinal canal is patent. There is moderate left neural foraminal stenosis C6-C7. Upper chest: The imaged lung apices are clear. Other: None. IMPRESSION: No acute fracture or traumatic malalignment of the cervical spine. Electronically Signed   By: Valetta Mole M.D.   On: 04/29/2021 14:36   CT Hip Left Wo Contrast  Result Date: 04/29/2021 CLINICAL DATA:  Pain left hip with ambulation, fall. EXAM: CT OF THE LEFT HIP WITHOUT CONTRAST TECHNIQUE: Multidetector CT imaging of the left hip was performed according to the standard protocol. Multiplanar CT image reconstructions were also generated. COMPARISON:  Radiographs 04/29/2021 and CT pelvis 06/16/2020 FINDINGS: Bones/Joint/Cartilage Substantial spurring of the left femoral head and acetabulum with degenerative subcortical cysts in the acetabulum as well as an apparent geode of the acetabular roof which has increased in size, measuring about 1.5 by 0.7 cm on image 41 series 4 compared to previous 0.9 by 0.6 cm. 3 mm well corticated chronically fragmented spur along the left anterior superior acetabulum, the fragmented nature of this spur component is more clearly delineated on today's exam. Linear calcification along the anterior capsular margin in the hip joint on image 30 of series 2 appears to be new. No hip joint effusion.  Moderate degenerative chondral thinning. Ligaments Suboptimally assessed by CT. Muscles and Tendons Unremarkable Soft tissues Trace subcutaneous edema lateral to the left hip. IMPRESSION: 1. Subtle linear calcification along the anterior capsular margin of the hip not well seen on the prior exam, and could represent a small free osteochondral fragment. There is also some chronic fragmentation of the anterior acetabular spur. 2. Moderate osteoarthritis of the left hip with associated spurring and loss of articular space. Increase size of Schmorl's  node in the left acetabular roof. 3. Mild subcutaneous edema lateral to the left hip. 4. No well-defined acute fracture. Electronically Signed   By: Van Clines M.D.   On: 04/29/2021 15:35   DG Hip Unilat W or Wo Pelvis 2-3 Views Left  Result Date: 04/29/2021 CLINICAL DATA:  Pain post fall EXAM: DG HIP (WITH OR WITHOUT PELVIS) 2-3V LEFT COMPARISON:  CT 06/16/2020 and previous FINDINGS: Right  hip arthroplasty components project in expected location. No fracture or dislocation. Mild narrowing of articular cartilage in the left hip with small marginal spurs from the femoral head. Bony pelvis intact. IMPRESSION: No acute findings Electronically Signed   By: Lucrezia Europe M.D.   On: 04/29/2021 14:20     Imaging of the left hip discussed with Dr. Rudene Christians of orthopedics who also repots viewing images.  He advising weightbearing as tolerated, use of walker reasonable with follow-up ____________________________________________   PROCEDURES  Procedure(s) performed: None  Procedures  Critical Care performed: No  ____________________________________________   INITIAL IMPRESSION / ASSESSMENT AND PLAN / ED COURSE  Pertinent labs & imaging results that were available during my care of the patient were reviewed by me and considered in my medical decision making (see chart for details).   Patient reports mechanical fall.  No loss consciousness.  No preceding medical symptoms.  Reassuring primary and secondary assessment here.  CT of the head and cervical spine reviewed, both negative for acute trauma.  No intracranial hemorrhage.  Left hip negative.  I did have the patient's stand and she is able to ambulate 1-2 steps but does not wish to bear weight reporting pain in the left hip joint region.  No evidence of injury to the knee distal femur left lower leg.  We will obtain CT of the left hip to further evaluate and exclude occult fracture.  Oxycodone given, patient reports allergy to several medications  but took oxycodone when she had a rectal cyst and had surgery earlier in the year and this worked well for her.   ----------------------------------------- 4:00 PM on 04/29/2021 ----------------------------------------- Pain much better.  She is now able to stand and walk independently with an antalgic gait but able to do so with oxycodone. I will prescribe the patient a narcotic pain medicine due to their condition which I anticipate will cause at least moderate pain short term. I discussed with the patient safe use of narcotic pain medicines, and that they are not to drive or take more than prescribed (no more than 1 pill every 6 hours). We discussed that this is the type of medication that can be  overdosed on and the risks of this type of medicine. Patient is very agreeable to only use as prescribed and to never use more than prescribed.   Return precautions and treatment recommendations and follow-up discussed with the patient who is agreeable with the plan.    ____________________________________________   FINAL CLINICAL IMPRESSION(S) / ED DIAGNOSES  Final diagnoses:  Contusion of left hip and thigh, initial encounter        Note:  This document was prepared using Dragon voice recognition software and may include unintentional dictation errors       Delman Kitten, MD 04/29/21 1601

## 2021-04-29 NOTE — ED Provider Notes (Signed)
Emergency Medicine Provider Triage Evaluation Note  Debra Hale , a 70 y.o. female  was evaluated in triage.  Pt complains of Emergency Medicine Provider Triage Evaluation Note  Debra Hale , a 70 y.o. female  was evaluated in triage.  Pt complains of headache and left hip pain after tripping on a curb. She hit her head but denies loss of consciousness.  Review of Systems  Positive: Headache, hip pain Negative: Loss of consciousness  Physical Exam  BP 140/81    Pulse 83    Temp 98.4 F (36.9 C) (Oral)    Resp 18    Ht 5\' 4"  (1.626 m)    Wt 58.6 kg    SpO2 97%    BMI 22.18 kg/m  Gen:   Awake, no distress   Resp:  Normal effort  MSK:   Moves extremities without difficulty  Other:    Medical Decision Making  Medically screening exam initiated at 1:48 PM.  Appropriate orders placed.  [name] was informed that the remainder of the evaluation will be completed by another provider, this initial triage assessment does not replace that evaluation, and the importance of remaining in the ED until their evaluation is complete. Victorino Dike, FNP 04/29/21 1349    Merlyn Lot, MD 04/29/21 [phone]

## 2021-04-29 NOTE — ED Notes (Signed)
Patient discharged to home per MD order. Patient in stable condition, and deemed medically cleared by ED provider for discharge. Discharge instructions reviewed with patient/family using "Teach Back"; verbalized understanding of medication education and administration, and information about follow-up care. Denies further concerns. ° °

## 2021-04-29 NOTE — ED Triage Notes (Signed)
Presents via EMS s/p trip and fall  hematoma to forehead  no loc   IV 20 right forearm  Cbg  118

## 2021-04-30 ENCOUNTER — Inpatient Hospital Stay: Payer: PPO

## 2021-05-01 ENCOUNTER — Inpatient Hospital Stay: Payer: PPO

## 2021-05-01 NOTE — Progress Notes (Signed)
Debra Hale  Telephone:(336) [phone] Fax:(336) [phone]  ID: Debra Hale OB: June 18, 1950  MR#: [id number]  CSN#:[id number]  Patient Care Team: [name], MD as PCP - General (Internal Medicine) Wellington Hampshire, MD as PCP - Cardiology (Cardiology)  CHIEF COMPLAINT: AML.  INTERVAL HISTORY: Patient returns to clinic today for further evaluation and continuation of maintenance Vidaza.  Treatment was delayed 1 week after patient was admitted to the hospital with a fall, but did not sustain any significant injury.  She continues to have chronic weakness and fatigue, but otherwise is tolerating her treatments well without significant side effects.  She denies any pain.  She has no neurologic complaints.  She denies any recent fevers or illnesses. She has no chest pain, shortness of breath, cough, or hemoptysis.  She denies any nausea, vomiting, constipation, or diarrhea.  She has no urinary complaints.  Patient offers no further specific complaints today.  REVIEW OF SYSTEMS:   Review of Systems  Constitutional:  Positive for malaise/fatigue. Negative for fever and weight loss.  Respiratory: Negative.  Negative for cough, hemoptysis and shortness of breath.   Cardiovascular: Negative.  Negative for chest pain and leg swelling.  Gastrointestinal:  Negative for abdominal pain and diarrhea.  Genitourinary: Negative.  Negative for dysuria.  Musculoskeletal:  Positive for falls. Negative for back pain.  Skin: Negative.  Negative for rash.  Neurological:  Positive for weakness. Negative for dizziness, focal weakness and headaches.  Psychiatric/Behavioral: Negative.  Negative for memory loss. The patient is not nervous/anxious.    As per HPI. Otherwise, a complete review of systems is negative.  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   s/p surgery and XRT   Osteoporosis    Other and unspecified hyperlipidemia    Palpitations    Personal history of chemotherapy    Personal history of malignant neoplasm of large intestine    carcinoma - cecum, s/p right laparoscopic colectomy - s/p chemotherapy and XRT   Personal history of radiation therapy    Pneumonia 2019   PONV (postoperative nausea and vomiting)    Pure hypercholesterolemia    Unspecified hereditary and idiopathic peripheral neuropathy     PAST SURGICAL HISTORY: Past Surgical History:  Procedure Laterality Date   APPENDECTOMY  1985   BREAST BIOPSY Right 2009   positive   BREAST BIOPSY Right 2009   negative   BREAST LUMPECTOMY Right 2009   breast cancer   CHOLECYSTECTOMY  1995   COLONOSCOPY     COLONOSCOPY WITH PROPOFOL N/A 10/29/2017   Procedure: COLONOSCOPY WITH PROPOFOL;  Surgeon: Manya Silvas, MD;  Location: [facility] ENDOSCOPY;  Service: Endoscopy;  Laterality: N/A;   DILATION AND CURETTAGE OF UTERUS  1990   DILATION AND CURETTAGE, DIAGNOSTIC / THERAPEUTIC  1990   ESOPHAGOGASTRODUODENOSCOPY     ESOPHAGOGASTRODUODENOSCOPY (EGD) WITH PROPOFOL N/A 10/29/2017   Procedure: ESOPHAGOGASTRODUODENOSCOPY (EGD) WITH PROPOFOL;  Surgeon:  Manya Silvas, MD;  Location: [facility] ENDOSCOPY;  Service: Endoscopy;  Laterality: N/A;   INCISION AND DRAINAGE PERIRECTAL ABSCESS N/A 06/17/2020   Procedure: IRRIGATION AND DEBRIDEMENT PERIRECTAL ABSCESS;  Surgeon: Jules Husbands, MD;  Location: [facility] ORS;  Service: General;  Laterality: N/A;   LAPAROSCOPIC PARTIAL COLECTOMY     stage 3-C carcinoma of the cecum, s/p chemotherapy and xrt   LITHOTRIPSY      SIGMOIDOSCOPY  08/26/1993   THYROID LOBECTOMY  2002   s/p XRT   TOTAL HIP ARTHROPLASTY Right 10/24/2019   Procedure: TOTAL HIP ARTHROPLASTY;  Surgeon: Corky Mull, MD;  Location: [facility] ORS;  Service: Orthopedics;  Laterality: Right;    FAMILY HISTORY: Family History  Problem Relation Age of Onset   Stroke Mother        17s   Alzheimer's disease Mother    Cancer Mother    Lung cancer Father    Prostate cancer Father    Cancer Father        Colon   Colon cancer Father    Breast cancer Sister        43's   Lung cancer Sister    Breast cancer Maternal Aunt     ADVANCED DIRECTIVES (Y/N):  N  HEALTH MAINTENANCE: Social History   Tobacco Use   Smoking status: Never   Smokeless tobacco: Never  Vaping Use   Vaping Use: Never used  Substance Use Topics   Alcohol use: No    Alcohol/week: 0.0 standard drinks   Drug use: No     Colonoscopy:  PAP:  Bone density:  Lipid panel:  Allergies  Allergen Reactions   Demeclocycline Other (See Comments)    Throat swells   Sulfa Antibiotics Other (See Comments)    Other reaction(s): Other (See Comments) Throat swells Other reaction(s): Other (See Comments) Throat swells   Tetracyclines & Related Other (See Comments)    Throat swells    Augmentin [Amoxicillin-Pot Clavulanate] Diarrhea   Bentyl [Dicyclomine Hcl]     unkn   Ciprofloxacin Diarrhea   Codeine Other (See Comments)    dizziness     Dicyclomine Hcl Other (See Comments)    unkn   Epinephrine     Speeds heart up - panic    Flagyl [Metronidazole] Nausea And Vomiting   Librax [Chlordiazepoxide-Clidinium]     unkn   Novocain [Procaine] Other (See Comments)    "Shaky"   Phenobarbital     unkn   Prednisone     Nervous     Ultram [Tramadol] Other (See Comments)    Sick feeling    Current Outpatient Medications  Medication Sig Dispense Refill   Azelastine-Fluticasone 137-50 MCG/ACT SUSP SPRAY 2 SPRAYS INTO EACH NOSTRIL EVERY DAY 23 g 2   clonazePAM  (KLONOPIN) 0.5 MG tablet Take 1.5 mg by mouth 2 (two) times daily.   0   diphenoxylate-atropine (LOMOTIL) 2.5-0.025 MG tablet Take 1 tablet by mouth 4 (four) times daily as needed for diarrhea or loose stools. 120 tablet 1   escitalopram (LEXAPRO) 20 MG tablet Take 20 mg by mouth every morning.      levothyroxine (SYNTHROID) 125 MCG tablet TAKE 1 TABLET EVERY DAY ON EMPTY STOMACHWITH A GLASS OF WATER AT LEAST 30-60 MINBEFORE BREAKFAST 90 tablet 1   loperamide (IMODIUM) 2 MG capsule Take by mouth.     magnesium oxide (MAG-OX) 400 MG tablet TAKE 1 TABLET BY MOUTH DAILY 30 tablet 0   metoprolol tartrate (LOPRESSOR) 25 MG tablet  TAKE 1 TABLET (25 MG TOTAL) BY MOUTH AS NEEDED (AS NEEDED UP TO TWICE DAILY FOR PALPITATIONS). 60 tablet 1   oxyCODONE-acetaminophen (PERCOCET/ROXICET) 5-325 MG tablet Take 1 tablet by mouth every 6 (six) hours as needed for severe pain. 16 tablet 0   pantoprazole (PROTONIX) 40 MG tablet TAKE ONE TABLET (40 MG) BY MOUTH EVERY DAY 90 tablet 2   potassium chloride SA (KLOR-CON) 20 MEQ tablet TAKE ONE (1) TABLET BY MOUTH TWO TIMES PER DAY 60 tablet 0   prochlorperazine (COMPAZINE) 10 MG tablet Take 1 tablet (10 mg total) by mouth every 6 (six) hours as needed for refractory nausea / vomiting. 30 tablet 0   valACYclovir (VALTREX) 500 MG tablet Take 1 tablet by mouth daily.     Current Facility-Administered Medications  Medication Dose Route Frequency Provider Last Rate Last Admin   ondansetron (ZOFRAN) tablet 8 mg  8 mg Oral Once Guse, Jacquelynn Cree, FNP       Facility-Administered Medications Ordered in Other Visits  Medication Dose Route Frequency Provider Last Rate Last Admin   potassium chloride (KLOR-CON) CR tablet 40 mEq  40 mEq Oral BID Lloyd Huger, MD   40 mEq at 07/08/20 1136    OBJECTIVE: Vitals:   05/06/21 0932  BP: 131/79  Pulse: 89  Resp: 16  Temp: (!) 96.3 F (35.7 C)  SpO2: 95%     Body mass index is 22.06 kg/m.    ECOG FS:0 -  Asymptomatic  General: Well-developed, well-nourished, no acute distress.  Sitting in a wheelchair. Eyes: Pink conjunctiva, anicteric sclera. HEENT: Normocephalic, moist mucous membranes. Lungs: No audible wheezing or coughing. Heart: Regular rate and rhythm. Abdomen: Soft, nontender, no obvious distention. Musculoskeletal: No edema, cyanosis, or clubbing. Neuro: Alert, answering all questions appropriately. Cranial nerves grossly intact. Skin: No rashes or petechiae noted. Psych: Normal affect.   LAB RESULTS:  Lab Results  Component Value Date   NA 140 05/06/2021   K 3.6 05/06/2021   CL 105 05/06/2021   CO2 26 05/06/2021   GLUCOSE 93 05/06/2021   BUN 20 05/06/2021   CREATININE 0.78 05/06/2021   CALCIUM 8.8 (L) 05/06/2021   PROT 6.4 (L) 05/06/2021   ALBUMIN 3.1 (L) 05/06/2021   AST 12 (L) 05/06/2021   ALT 19 05/06/2021   ALKPHOS 118 05/06/2021   BILITOT 0.3 05/06/2021   GFRNONAA >60 05/06/2021   GFRAA 54 (L) 10/26/2019    Lab Results  Component Value Date   WBC 1.8 (L) 05/06/2021   NEUTROABS 1.1 (L) 05/06/2021   HGB 12.8 05/06/2021   HCT 38.4 05/06/2021   MCV 83.7 05/06/2021   PLT 154 05/06/2021     STUDIES: CT Head Wo Contrast  Result Date: 04/29/2021 CLINICAL DATA:  Tripped and fell, headache EXAM: CT HEAD WITHOUT CONTRAST TECHNIQUE: Contiguous axial images were obtained from the base of the skull through the vertex without intravenous contrast. COMPARISON:  Brain MRI 05/12/2018 FINDINGS: Brain: There is no evidence of acute intracranial hemorrhage, extra-axial fluid collection, or acute infarct. Parenchymal volume is within normal limits. The ventricles are normal in size. There is mild chronic white matter microangiopathy. There is no mass lesion.  There is no midline shift. Vascular: There is calcification of the bilateral cavernous ICAs. Skull: Normal. Negative for fracture or focal lesion. Sinuses/Orbits: There is complete opacification of the imaged right  maxillary sinus and mucosal thickening in the right anterior ethmoid air cells. Bilateral lens implants are in place. The globes and orbits are otherwise  unremarkable. Other: None. IMPRESSION: No acute intracranial pathology. Electronically Signed   By: Valetta Mole M.D.   On: 04/29/2021 14:31   CT Cervical Spine Wo Contrast  Result Date: 04/29/2021 CLINICAL DATA:  Tripped and fell, neck pain EXAM: CT CERVICAL SPINE WITHOUT CONTRAST TECHNIQUE: Multidetector CT imaging of the cervical spine was performed without intravenous contrast. Multiplanar CT image reconstructions were also generated. COMPARISON:  None. FINDINGS: Alignment: Normal. There is no antero or retrolisthesis. There is no jumped or perched facets or other evidence of traumatic malalignment. Skull base and vertebrae: Skull base alignment is maintained. Vertebral body heights are preserved. There is no evidence of acute fracture. Soft tissues and spinal canal: No prevertebral fluid or swelling. No visible canal hematoma. Disc levels: There is mild multilevel degenerative endplate change in the cervical spine. The osseous spinal canal is patent. There is moderate left neural foraminal stenosis C6-C7. Upper chest: The imaged lung apices are clear. Other: None. IMPRESSION: No acute fracture or traumatic malalignment of the cervical spine. Electronically Signed   By: Valetta Mole M.D.   On: 04/29/2021 14:36   CT Hip Left Wo Contrast  Result Date: 04/29/2021 CLINICAL DATA:  Pain left hip with ambulation, fall. EXAM: CT OF THE LEFT HIP WITHOUT CONTRAST TECHNIQUE: Multidetector CT imaging of the left hip was performed according to the standard protocol. Multiplanar CT image reconstructions were also generated. COMPARISON:  Radiographs 04/29/2021 and CT pelvis 06/16/2020 FINDINGS: Bones/Joint/Cartilage Substantial spurring of the left femoral head and acetabulum with degenerative subcortical cysts in the acetabulum as well as an apparent geode of the  acetabular roof which has increased in size, measuring about 1.5 by 0.7 cm on image 41 series 4 compared to previous 0.9 by 0.6 cm. 3 mm well corticated chronically fragmented spur along the left anterior superior acetabulum, the fragmented nature of this spur component is more clearly delineated on today's exam. Linear calcification along the anterior capsular margin in the hip joint on image 30 of series 2 appears to be new. No hip joint effusion.  Moderate degenerative chondral thinning. Ligaments Suboptimally assessed by CT. Muscles and Tendons Unremarkable Soft tissues Trace subcutaneous edema lateral to the left hip. IMPRESSION: 1. Subtle linear calcification along the anterior capsular margin of the hip not well seen on the prior exam, and could represent a small free osteochondral fragment. There is also some chronic fragmentation of the anterior acetabular spur. 2. Moderate osteoarthritis of the left hip with associated spurring and loss of articular space. Increase size of Schmorl's node in the left acetabular roof. 3. Mild subcutaneous edema lateral to the left hip. 4. No well-defined acute fracture. Electronically Signed   By: Van Clines M.D.   On: 04/29/2021 15:35   DG Hip Unilat W or Wo Pelvis 2-3 Views Left  Result Date: 04/29/2021 CLINICAL DATA:  Pain post fall EXAM: DG HIP (WITH OR WITHOUT PELVIS) 2-3V LEFT COMPARISON:  CT 06/16/2020 and previous FINDINGS: Right hip arthroplasty components project in expected location. No fracture or dislocation. Mild narrowing of articular cartilage in the left hip with small marginal spurs from the femoral head. Bony pelvis intact. IMPRESSION: No acute findings Electronically Signed   By: Lucrezia Europe M.D.   On: 04/29/2021 14:20     ASSESSMENT: AML.  PLAN:    1.  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  Her peripheral blood flow cytometry also revealed 6% aberrant myeloblasts.  Patient completed cycle 1 of induction therapy at [facility] with Vidaza and  venetoclax.  She only received 5 days of Vidaza for cycle 2 recently which was interrupted secondary to significant diarrhea and a perirectal abscess.  Repeat bone marrow biopsy on January 02, 2021 did not reveal any overt disease, but cytogenetics remained positive likely indicating a very good partial remission.  Patient has been instructed to discontinue venetoclax.  Will continue maintenance treatment with Vidaza on days 1 through 5 every 28 days.  Proceed with cycle 12 of treatment today.  Return to clinic daily for the remainder of the week for treatment and then in 4 weeks for further evaluation and consideration of cycle 13.  2.  Neutropenia: Chronic and unchanged.  Bone marrow biopsy as above. 3.  Thrombocytopenia: Resolved.  Discontinue venetoclax as above.   4.  Perirectal abscess: Resolved.   5.  Diarrhea: Intermittent.  Continue Lomotil as needed. 6.  Supportive care: Patient previously was given referrals to home health and home palliative care.  Appreciate palliative care input.    7.  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  Patient also received chemotherapy and likely received Adriamycin, but exact regimen is unknown.  She completed 5 years of hormonal therapy in approximately June 2015. Currently, she has no evidence of disease.  Her most recent mammogram on March 05, 2020 was reported as BI-RADS 1.  Repeat in November 2022. 8.  History of colon cancer: Patient also states that she received chemotherapy for this, possibly FOLFOX but again this is unknown.   Patient expressed understanding and was in agreement with this plan. She also understands that She can call clinic at any time with any questions, concerns, or complaints.    Cancer Staging  History of breast cancer Staging form: Breast, AJCC 7th Edition -  Clinical stage from 01/07/2016: Stage IA (T1c, N0, M0) - Signed by Lloyd Huger, MD on 01/07/2016 Laterality: Right Estrogen receptor status: Positive Progesterone receptor status: Positive HER2 status: Negative   Lloyd Huger, MD   05/06/2021 1:07 PM

## 2021-05-02 ENCOUNTER — Inpatient Hospital Stay: Payer: PPO

## 2021-05-06 ENCOUNTER — Inpatient Hospital Stay: Payer: PPO

## 2021-05-06 ENCOUNTER — Other Ambulatory Visit: Payer: Self-pay

## 2021-05-06 ENCOUNTER — Inpatient Hospital Stay: Payer: PPO | Attending: Oncology | Admitting: Oncology

## 2021-05-06 VITALS — BP 131/79 | HR 89 | Temp 96.3°F | Resp 16 | Wt 128.5 lb

## 2021-05-06 DIAGNOSIS — Z85038 Personal history of other malignant neoplasm of large intestine: Secondary | ICD-10-CM | POA: Insufficient documentation

## 2021-05-06 DIAGNOSIS — Z923 Personal history of irradiation: Secondary | ICD-10-CM | POA: Diagnosis not present

## 2021-05-06 DIAGNOSIS — G609 Hereditary and idiopathic neuropathy, unspecified: Secondary | ICD-10-CM | POA: Diagnosis not present

## 2021-05-06 DIAGNOSIS — E7800 Pure hypercholesterolemia, unspecified: Secondary | ICD-10-CM | POA: Insufficient documentation

## 2021-05-06 DIAGNOSIS — D709 Neutropenia, unspecified: Secondary | ICD-10-CM | POA: Insufficient documentation

## 2021-05-06 DIAGNOSIS — Z5111 Encounter for antineoplastic chemotherapy: Secondary | ICD-10-CM | POA: Insufficient documentation

## 2021-05-06 DIAGNOSIS — Z85850 Personal history of malignant neoplasm of thyroid: Secondary | ICD-10-CM | POA: Diagnosis not present

## 2021-05-06 DIAGNOSIS — E785 Hyperlipidemia, unspecified: Secondary | ICD-10-CM | POA: Insufficient documentation

## 2021-05-06 DIAGNOSIS — Z79899 Other long term (current) drug therapy: Secondary | ICD-10-CM | POA: Diagnosis not present

## 2021-05-06 DIAGNOSIS — R197 Diarrhea, unspecified: Secondary | ICD-10-CM | POA: Diagnosis not present

## 2021-05-06 DIAGNOSIS — C9200 Acute myeloblastic leukemia, not having achieved remission: Secondary | ICD-10-CM

## 2021-05-06 DIAGNOSIS — R5383 Other fatigue: Secondary | ICD-10-CM | POA: Diagnosis not present

## 2021-05-06 DIAGNOSIS — M810 Age-related osteoporosis without current pathological fracture: Secondary | ICD-10-CM | POA: Insufficient documentation

## 2021-05-06 DIAGNOSIS — Z853 Personal history of malignant neoplasm of breast: Secondary | ICD-10-CM | POA: Insufficient documentation

## 2021-05-06 DIAGNOSIS — E039 Hypothyroidism, unspecified: Secondary | ICD-10-CM | POA: Insufficient documentation

## 2021-05-06 LAB — COMPREHENSIVE METABOLIC PANEL
ALT: 19 U/L (ref 0–44)
AST: 12 U/L — ABNORMAL LOW (ref 15–41)
Albumin: 3.1 g/dL — ABNORMAL LOW (ref 3.5–5.0)
Alkaline Phosphatase: 118 U/L (ref 38–126)
Anion gap: 9 (ref 5–15)
BUN: 20 mg/dL (ref 8–23)
CO2: 26 mmol/L (ref 22–32)
Calcium: 8.8 mg/dL — ABNORMAL LOW (ref 8.9–10.3)
Chloride: 105 mmol/L (ref 98–111)
Creatinine, Ser: 0.78 mg/dL (ref 0.44–1.00)
GFR, Estimated: >60 mL/min (ref >60)
Glucose, Bld: 93 mg/dL (ref 70–99)
Potassium: 3.6 mmol/L (ref 3.5–5.1)
Sodium: 140 mmol/L (ref 135–145)
Total Bilirubin: 0.3 mg/dL (ref 0.3–1.2)
Total Protein: 6.4 g/dL — ABNORMAL LOW (ref 6.5–8.1)

## 2021-05-06 LAB — CBC WITH DIFFERENTIAL/PLATELET
Abs Immature Granulocytes: 0.01 10*3/uL (ref 0.00–0.07)
Basophils Absolute: 0 10*3/uL (ref 0.0–0.1)
Basophils Relative: 1 %
Eosinophils Absolute: 0 10*3/uL (ref 0.0–0.5)
Eosinophils Relative: 2 %
HCT: 38.4 % (ref 36.0–46.0)
Hemoglobin: 12.8 g/dL (ref 12.0–15.0)
Immature Granulocytes: 1 %
Lymphocytes Relative: 29 %
Lymphs Abs: 0.5 10*3/uL — ABNORMAL LOW (ref 0.7–4.0)
MCH: 27.9 pg (ref 26.0–34.0)
MCHC: 33.3 g/dL (ref 30.0–36.0)
MCV: 83.7 fL (ref 80.0–100.0)
Monocytes Absolute: 0.1 10*3/uL (ref 0.1–1.0)
Monocytes Relative: 7 %
Neutro Abs: 1.1 10*3/uL — ABNORMAL LOW (ref 1.7–7.7)
Neutrophils Relative %: 60 %
Platelets: 154 10*3/uL (ref 150–400)
RBC: 4.59 MIL/uL (ref 3.87–5.11)
RDW: 13.5 % (ref 11.5–15.5)
WBC: 1.8 10*3/uL — ABNORMAL LOW (ref 4.0–10.5)
nRBC: 0 % (ref 0.0–0.2)

## 2021-05-06 LAB — SAMPLE TO BLOOD BANK

## 2021-05-06 MED ORDER — AZACITIDINE CHEMO SQ INJECTION
50.0000 mg/m2 | Freq: Once | INTRAMUSCULAR | Status: AC
  Administered 2021-05-06: 87.5 mg via SUBCUTANEOUS
  Filled 2021-05-06: qty 3.5

## 2021-05-06 MED ORDER — ONDANSETRON HCL 4 MG PO TABS
8.0000 mg | ORAL_TABLET | Freq: Once | ORAL | Status: AC
  Administered 2021-05-06: 8 mg via ORAL
  Filled 2021-05-06: qty 2

## 2021-05-06 NOTE — Progress Notes (Signed)
Pt reports tripping over curb on 12/27. Reports pain in left hip and head from fall. Denies LOC. Went to ER, medically cleared and sent home. Denies any other concerns at this time.

## 2021-05-06 NOTE — Progress Notes (Signed)
05/06/21: WBC 1.8, ANC 1.1, No CMP collected. Per Tiffany RN per Dr. [name] okay to proceed with treatment as scheduled.    Per Dr. [name] CMP to be collected, okay to proceed prior to [facility] results.

## 2021-05-06 NOTE — Patient Instructions (Signed)
[facility] CANCER CTR AT [place]  Discharge Instructions: Thank you for choosing [facility] to provide your oncology and hematology care.  If you have a lab appointment with the [facility], please go directly to the [facility] and check in at the registration area.  Wear comfortable clothing and clothing appropriate for easy access to any Portacath or PICC line.   We strive to give you quality time with your provider. You may need to reschedule your appointment if you arrive late (15 or more minutes).  Arriving late affects you and other patients whose appointments are after yours.  Also, if you miss three or more appointments without notifying the office, you may be dismissed from the clinic at the providers discretion.      For prescription refill requests, have your pharmacy contact our office and allow 72 hours for refills to be completed.    Today you received the following chemotherapy and/or immunotherapy agents Vidaza      To help prevent nausea and vomiting after your treatment, we encourage you to take your nausea medication as directed.  BELOW ARE SYMPTOMS THAT SHOULD BE REPORTED IMMEDIATELY: *FEVER GREATER THAN 100.4 F (38 C) OR HIGHER *CHILLS OR SWEATING *NAUSEA AND VOMITING THAT IS NOT CONTROLLED WITH YOUR NAUSEA MEDICATION *UNUSUAL SHORTNESS OF BREATH *UNUSUAL BRUISING OR BLEEDING *URINARY PROBLEMS (pain or burning when urinating, or frequent urination) *BOWEL PROBLEMS (unusual diarrhea, constipation, pain near the anus) TENDERNESS IN MOUTH AND THROAT WITH OR WITHOUT PRESENCE OF ULCERS (sore throat, sores in mouth, or a toothache) UNUSUAL RASH, SWELLING OR PAIN  UNUSUAL VAGINAL DISCHARGE OR ITCHING   Items with * indicate a potential emergency and should be followed up as soon as possible or go to the Emergency Department if any problems should occur.  Please show the CHEMOTHERAPY ALERT CARD or IMMUNOTHERAPY ALERT CARD at check-in to the  Emergency Department and triage nurse.  Should you have questions after your visit or need to cancel or reschedule your appointment, please contact [facility] CANCER [place] AT [place]  [phone] and follow the prompts.  Office hours are 8:00 a.m. to 4:30 p.m. Monday - Friday. Please note that voicemails left after 4:00 p.m. may not be returned until the following business day.  We are closed weekends and major holidays. You have access to a nurse at all times for urgent questions. Please call the main number to the clinic [phone] and follow the prompts.  For any non-urgent questions, you may also contact your provider using MyChart. We now offer e-Visits for anyone 58 and older to request care online for non-urgent symptoms. For details visit [email or web address].   Also download the MyChart app! Go to the app store, search "MyChart", open the app, select [facility], and log in with your MyChart username and password.  Due to Covid, a mask is required upon entering the hospital/clinic. If you do not have a mask, one will be given to you upon arrival. For doctor visits, patients may have 1 support person aged 69 or older with them. For treatment visits, patients cannot have anyone with them due to current Covid guidelines and our immunocompromised population.

## 2021-05-07 ENCOUNTER — Inpatient Hospital Stay: Payer: PPO

## 2021-05-07 VITALS — BP 128/75 | HR 76 | Temp 98.1°F | Resp 19

## 2021-05-07 DIAGNOSIS — C9200 Acute myeloblastic leukemia, not having achieved remission: Secondary | ICD-10-CM

## 2021-05-07 DIAGNOSIS — Z5111 Encounter for antineoplastic chemotherapy: Secondary | ICD-10-CM | POA: Diagnosis not present

## 2021-05-07 MED ORDER — AZACITIDINE CHEMO SQ INJECTION
50.0000 mg/m2 | Freq: Once | INTRAMUSCULAR | Status: AC
  Administered 2021-05-07: 87.5 mg via SUBCUTANEOUS
  Filled 2021-05-07: qty 3.5

## 2021-05-07 MED ORDER — ONDANSETRON HCL 4 MG PO TABS
8.0000 mg | ORAL_TABLET | Freq: Once | ORAL | Status: AC
  Administered 2021-05-07: 8 mg via ORAL
  Filled 2021-05-07: qty 2

## 2021-05-08 ENCOUNTER — Other Ambulatory Visit: Payer: Self-pay

## 2021-05-08 ENCOUNTER — Inpatient Hospital Stay: Payer: PPO

## 2021-05-08 VITALS — BP 135/72 | HR 67 | Temp 97.0°F | Resp 18

## 2021-05-08 DIAGNOSIS — C9200 Acute myeloblastic leukemia, not having achieved remission: Secondary | ICD-10-CM

## 2021-05-08 DIAGNOSIS — Z5111 Encounter for antineoplastic chemotherapy: Secondary | ICD-10-CM | POA: Diagnosis not present

## 2021-05-08 MED ORDER — AZACITIDINE CHEMO SQ INJECTION
50.0000 mg/m2 | Freq: Once | INTRAMUSCULAR | Status: AC
  Administered 2021-05-08: 87.5 mg via SUBCUTANEOUS
  Filled 2021-05-08: qty 3.5

## 2021-05-08 MED ORDER — ONDANSETRON HCL 4 MG PO TABS
8.0000 mg | ORAL_TABLET | Freq: Once | ORAL | Status: AC
  Administered 2021-05-08: 8 mg via ORAL
  Filled 2021-05-08: qty 2

## 2021-05-08 NOTE — Patient Instructions (Signed)
[facility] CANCER CTR AT [place]  Discharge Instructions: Thank you for choosing [facility] to provide your oncology and hematology care.  If you have a lab appointment with the [facility], please go directly to the [facility] and check in at the registration area.  Wear comfortable clothing and clothing appropriate for easy access to any Portacath or PICC line.   We strive to give you quality time with your provider. You may need to reschedule your appointment if you arrive late (15 or more minutes).  Arriving late affects you and other patients whose appointments are after yours.  Also, if you miss three or more appointments without notifying the office, you may be dismissed from the clinic at the providers discretion.      For prescription refill requests, have your pharmacy contact our office and allow 72 hours for refills to be completed.    Today you received the following chemotherapy and/or immunotherapy agents Vidaza      To help prevent nausea and vomiting after your treatment, we encourage you to take your nausea medication as directed.  BELOW ARE SYMPTOMS THAT SHOULD BE REPORTED IMMEDIATELY: *FEVER GREATER THAN 100.4 F (38 C) OR HIGHER *CHILLS OR SWEATING *NAUSEA AND VOMITING THAT IS NOT CONTROLLED WITH YOUR NAUSEA MEDICATION *UNUSUAL SHORTNESS OF BREATH *UNUSUAL BRUISING OR BLEEDING *URINARY PROBLEMS (pain or burning when urinating, or frequent urination) *BOWEL PROBLEMS (unusual diarrhea, constipation, pain near the anus) TENDERNESS IN MOUTH AND THROAT WITH OR WITHOUT PRESENCE OF ULCERS (sore throat, sores in mouth, or a toothache) UNUSUAL RASH, SWELLING OR PAIN  UNUSUAL VAGINAL DISCHARGE OR ITCHING   Items with * indicate a potential emergency and should be followed up as soon as possible or go to the Emergency Department if any problems should occur.  Please show the CHEMOTHERAPY ALERT CARD or IMMUNOTHERAPY ALERT CARD at check-in to the  Emergency Department and triage nurse.  Should you have questions after your visit or need to cancel or reschedule your appointment, please contact [facility] CANCER [place] AT [place]  [phone] and follow the prompts.  Office hours are 8:00 a.m. to 4:30 p.m. Monday - Friday. Please note that voicemails left after 4:00 p.m. may not be returned until the following business day.  We are closed weekends and major holidays. You have access to a nurse at all times for urgent questions. Please call the main number to the clinic [phone] and follow the prompts.  For any non-urgent questions, you may also contact your provider using MyChart. We now offer e-Visits for anyone 15 and older to request care online for non-urgent symptoms. For details visit [email or web address].   Also download the MyChart app! Go to the app store, search "MyChart", open the app, select [facility], and log in with your MyChart username and password.  Due to Covid, a mask is required upon entering the hospital/clinic. If you do not have a mask, one will be given to you upon arrival. For doctor visits, patients may have 1 support person aged 72 or older with them. For treatment visits, patients cannot have anyone with them due to current Covid guidelines and our immunocompromised population.

## 2021-05-09 ENCOUNTER — Other Ambulatory Visit: Payer: Self-pay | Admitting: Emergency Medicine

## 2021-05-09 ENCOUNTER — Inpatient Hospital Stay: Payer: PPO

## 2021-05-09 VITALS — BP 129/68 | HR 67 | Temp 97.1°F | Resp 18

## 2021-05-09 DIAGNOSIS — Z5111 Encounter for antineoplastic chemotherapy: Secondary | ICD-10-CM | POA: Diagnosis not present

## 2021-05-09 DIAGNOSIS — C9200 Acute myeloblastic leukemia, not having achieved remission: Secondary | ICD-10-CM

## 2021-05-09 MED ORDER — AZACITIDINE CHEMO SQ INJECTION
50.0000 mg/m2 | Freq: Once | INTRAMUSCULAR | Status: AC
  Administered 2021-05-09: 87.5 mg via SUBCUTANEOUS
  Filled 2021-05-09: qty 3.5

## 2021-05-09 MED ORDER — MAGNESIUM OXIDE 400 MG PO TABS: 1.0000 | ORAL_TABLET | Freq: Every day | ORAL | 0 refills | Status: DC

## 2021-05-09 MED ORDER — ONDANSETRON HCL 4 MG PO TABS
8.0000 mg | ORAL_TABLET | Freq: Once | ORAL | Status: AC
  Administered 2021-05-09: 8 mg via ORAL
  Filled 2021-05-09: qty 2

## 2021-05-09 NOTE — Patient Instructions (Signed)
[facility] CTR AT [place]  Discharge Instructions: Thank you for choosing [facility] to provide your oncology and hematology care.  If you have a lab appointment with the [facility], please go directly to the [facility] and check in at the registration area.  Wear comfortable clothing and clothing appropriate for easy access to any Portacath or PICC line.   We strive to give you quality time with your provider. You may need to reschedule your appointment if you arrive late (15 or more minutes).  Arriving late affects you and other patients whose appointments are after yours.  Also, if you miss three or more appointments without notifying the office, you may be dismissed from the clinic at the providers discretion.      For prescription refill requests, have your pharmacy contact our office and allow 72 hours for refills to be completed.    Today you received the following chemotherapy and/or immunotherapy agents VIDAZA      To help prevent nausea and vomiting after your treatment, we encourage you to take your nausea medication as directed.  BELOW ARE SYMPTOMS THAT SHOULD BE REPORTED IMMEDIATELY: *FEVER GREATER THAN 100.4 F (38 C) OR HIGHER *CHILLS OR SWEATING *NAUSEA AND VOMITING THAT IS NOT CONTROLLED WITH YOUR NAUSEA MEDICATION *UNUSUAL SHORTNESS OF BREATH *UNUSUAL BRUISING OR BLEEDING *URINARY PROBLEMS (pain or burning when urinating, or frequent urination) *BOWEL PROBLEMS (unusual diarrhea, constipation, pain near the anus) TENDERNESS IN MOUTH AND THROAT WITH OR WITHOUT PRESENCE OF ULCERS (sore throat, sores in mouth, or a toothache) UNUSUAL RASH, SWELLING OR PAIN  UNUSUAL VAGINAL DISCHARGE OR ITCHING   Items with * indicate a potential emergency and should be followed up as soon as possible or go to the Emergency Department if any problems should occur.  Please show the CHEMOTHERAPY ALERT CARD or IMMUNOTHERAPY ALERT CARD at check-in to the  Emergency Department and triage nurse.  Should you have questions after your visit or need to cancel or reschedule your appointment, please contact [facility] [place] AT [place]  [phone] and follow the prompts.  Office hours are 8:00 a.m. to 4:30 p.m. Monday - Friday. Please note that voicemails left after 4:00 p.m. may not be returned until the following business day.  We are closed weekends and major holidays. You have access to a nurse at all times for urgent questions. Please call the main number to the clinic [phone] and follow the prompts.  For any non-urgent questions, you may also contact your provider using MyChart. We now offer e-Visits for anyone 59 and older to request care online for non-urgent symptoms. For details visit [email or web address].   Also download the MyChart app! Go to the app store, search "MyChart", open the app, select [facility], and log in with your MyChart username and password.  Due to Covid, a mask is required upon entering the hospital/clinic. If you do not have a mask, one will be given to you upon arrival. For doctor visits, patients may have 1 support person aged 25 or older with them. For treatment visits, patients cannot have anyone with them due to current Covid guidelines and our immunocompromised population.   Azacitidine suspension for injection (subcutaneous use) What is this medication? AZACITIDINE (ay [place]) is a chemotherapy drug. This medicine reduces the growth of cancer cells and can suppress the immune system. It is used for treating myelodysplastic syndrome or some types of leukemia. This medicine may be used for other purposes; ask  your health care provider or pharmacist if you have questions. COMMON BRAND NAME(S): Vidaza What should I tell my care team before I take this medication? They need to know if you have any of these conditions: kidney disease liver disease liver tumors an unusual or allergic  reaction to azacitidine, mannitol, other medicines, foods, dyes, or preservatives pregnant or trying to get pregnant breast-feeding How should I use this medication? This medicine is for injection under the skin. It is administered in a hospital or clinic by a specially trained health care professional. Talk to your pediatrician regarding the use of this medicine in children. While this drug may be prescribed for selected conditions, precautions do apply. Overdosage: If you think you have taken too much of this medicine contact a poison control center or emergency room at once. NOTE: This medicine is only for you. Do not share this medicine with others. What if I miss a dose? It is important not to miss your dose. Call your doctor or health care professional if you are unable to keep an appointment. What may interact with this medication? Interactions have not been studied. Give your health care provider a list of all the medicines, herbs, non-prescription drugs, or dietary supplements you use. Also tell them if you smoke, drink alcohol, or use illegal drugs. Some items may interact with your medicine. This list may not describe all possible interactions. Give your health care provider a list of all the medicines, herbs, non-prescription drugs, or dietary supplements you use. Also tell them if you smoke, drink alcohol, or use illegal drugs. Some items may interact with your medicine. What should I watch for while using this medication? Visit your doctor for checks on your progress. This drug may make you feel generally unwell. This is not uncommon, as chemotherapy can affect healthy cells as well as cancer cells. Report any side effects. Continue your course of treatment even though you feel ill unless your doctor tells you to stop. In some cases, you may be given additional medicines to help with side effects. Follow all directions for their use. Call your doctor or health care professional for  advice if you get a fever, chills or sore throat, or other symptoms of a cold or flu. Do not treat yourself. This drug decreases your body's ability to fight infections. Try to avoid being around people who are sick. This medicine may increase your risk to bruise or bleed. Call your doctor or health care professional if you notice any unusual bleeding. You may need blood work done while you are taking this medicine. Do not become pregnant while taking this medicine and for 6 months after the last dose. Women should inform their doctor if they wish to become pregnant or think they might be pregnant. Men should not father a child while taking this medicine and for 3 months after the last dose. There is a potential for serious side effects to an unborn child. Talk to your health care professional or pharmacist for more information. Do not breast-feed an infant while taking this medicine and for 1 week after the last dose. This medicine may interfere with the ability to have a child. Talk with your doctor or health care professional if you are concerned about your fertility. What side effects may I notice from receiving this medication? Side effects that you should report to your doctor or health care professional as soon as possible: allergic reactions like skin rash, itching or hives, swelling of the face, lips,  or tongue low blood counts - this medicine may decrease the number of white blood cells, red blood cells and platelets. You may be at increased risk for infections and bleeding. signs of infection - fever or chills, cough, sore throat, pain passing urine signs of decreased platelets or bleeding - bruising, pinpoint red spots on the skin, black, tarry stools, blood in the urine signs of decreased red blood cells - unusually weak or tired, fainting spells, lightheadedness signs and symptoms of kidney injury like trouble passing urine or change in the amount of urine signs and symptoms of liver  injury like dark yellow or brown urine; general ill feeling or flu-like symptoms; light-colored stools; loss of appetite; nausea; right upper belly pain; unusually weak or tired; yellowing of the eyes or skin Side effects that usually do not require medical attention (report to your doctor or health care professional if they continue or are bothersome): constipation diarrhea nausea, vomiting pain or redness at the injection site unusually weak or tired This list may not describe all possible side effects. Call your doctor for medical advice about side effects. You may report side effects to FDA at 1-800-FDA-1088. Where should I keep my medication? This drug is given in a hospital or clinic and will not be stored at home. NOTE: This sheet is a summary. It may not cover all possible information. If you have questions about this medicine, talk to your doctor, pharmacist, or health care provider.  2022 Elsevier/Gold Standard (2016-05-20 00:00:00)

## 2021-05-13 ENCOUNTER — Encounter: Payer: Self-pay | Admitting: Oncology

## 2021-05-14 ENCOUNTER — Encounter: Payer: Self-pay | Admitting: Oncology

## 2021-05-14 ENCOUNTER — Other Ambulatory Visit: Payer: Self-pay | Admitting: *Deleted

## 2021-05-16 ENCOUNTER — Encounter: Payer: Self-pay | Admitting: Oncology

## 2021-05-19 ENCOUNTER — Inpatient Hospital Stay: Payer: PPO

## 2021-05-19 ENCOUNTER — Other Ambulatory Visit: Payer: PPO

## 2021-05-19 ENCOUNTER — Ambulatory Visit: Payer: PPO

## 2021-05-20 ENCOUNTER — Ambulatory Visit: Payer: PPO | Admitting: Oncology

## 2021-05-20 ENCOUNTER — Ambulatory Visit: Payer: PPO

## 2021-05-21 ENCOUNTER — Inpatient Hospital Stay: Payer: PPO

## 2021-05-21 ENCOUNTER — Ambulatory Visit: Payer: PPO

## 2021-05-21 IMAGING — CT CT BIOPSY AND ASPIRATION BONE MARROW
1 of 2 series · 10 of 14 positions shown, 13 images · non-contrast
Comparison: none

CLINICAL DATA: History of leukemia, sustained neutropenia

EXAM:
CT GUIDED DEEP ILIAC BONE ASPIRATION AND CORE BIOPSY
TECHNIQUE: Patient was placed prone on the CT gantry and limited axial scans
through the pelvis were obtained. Appropriate skin entry site was
identified. Skin site was marked, prepped with chlorhexidine, draped
in usual sterile fashion, and infiltrated locally with 1% lidocaine.

[Series 2: i-spiral 5.0 b30f · axial · 0.70mm/px · z∈[+828,+912]mm · 10 of 30 slices shown, 13 images]
[im 3/30  soft-tissue]
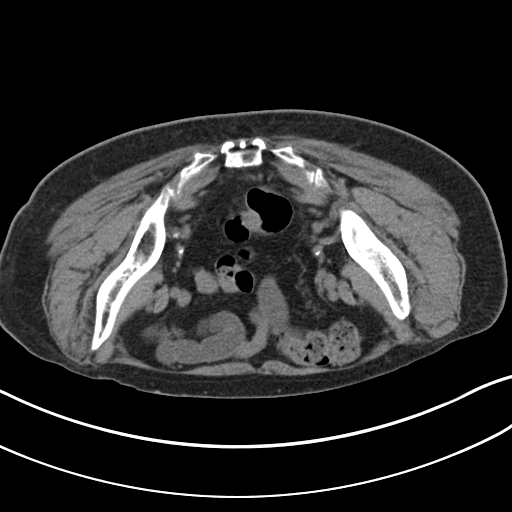
[im 3/30  bone]
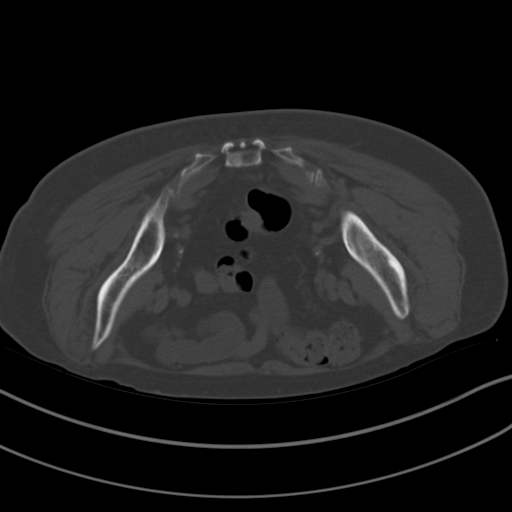
[im 6/30  bone]
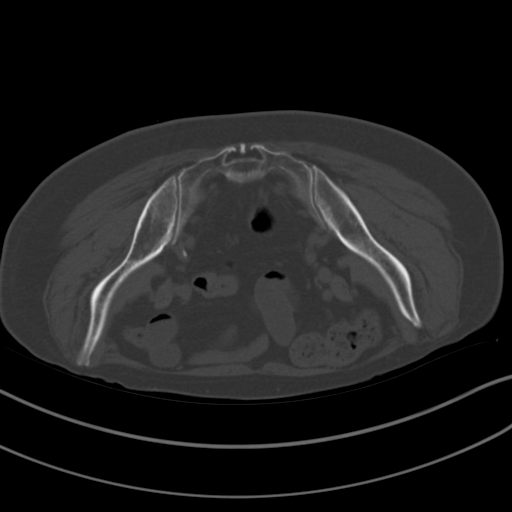
[im 8/30  bone]
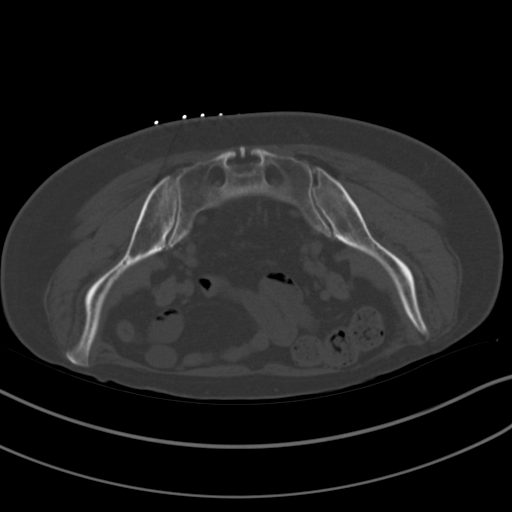
[im 11/30  bone]
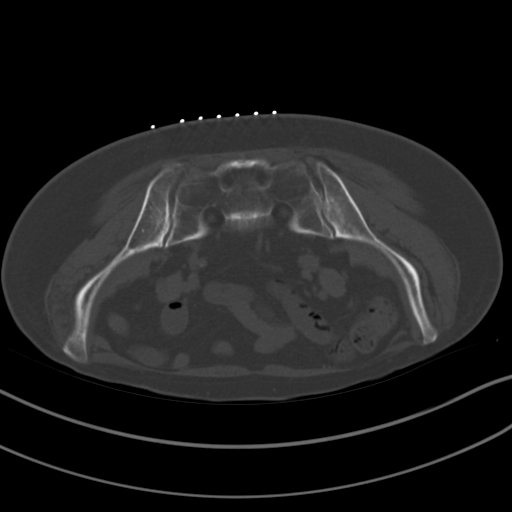
[im 14/30  soft-tissue]
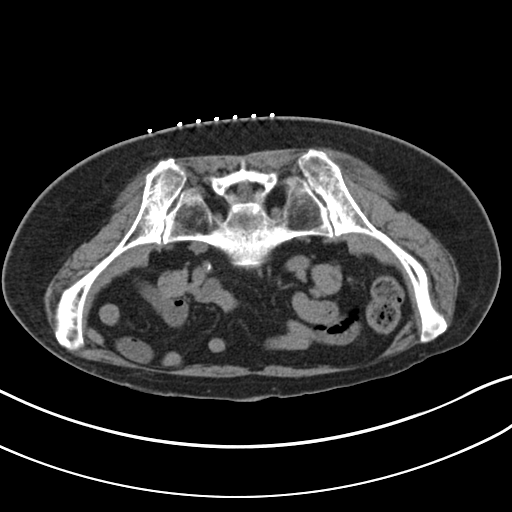
[im 14/30  bone]
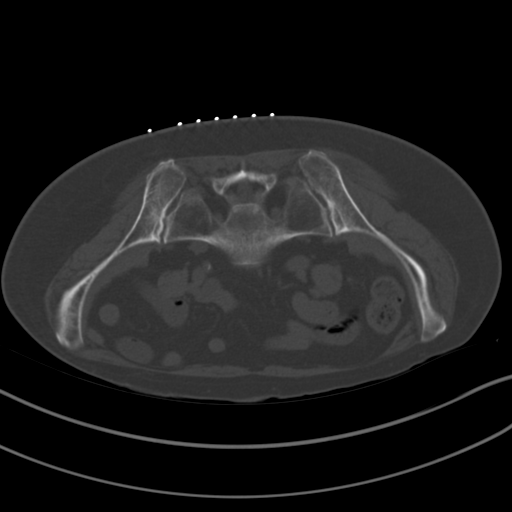
[im 16/30  bone]
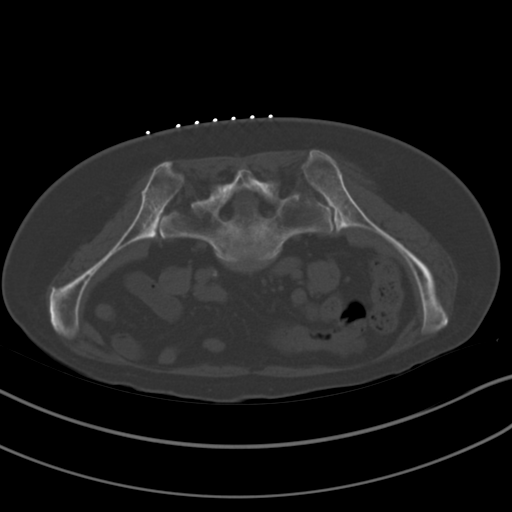
[im 19/30  bone]
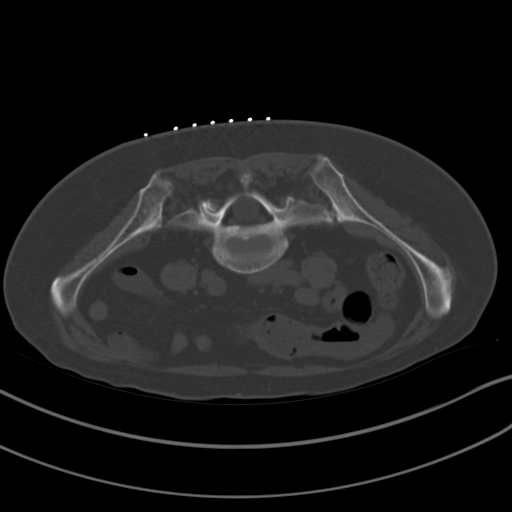
[im 22/30  bone]
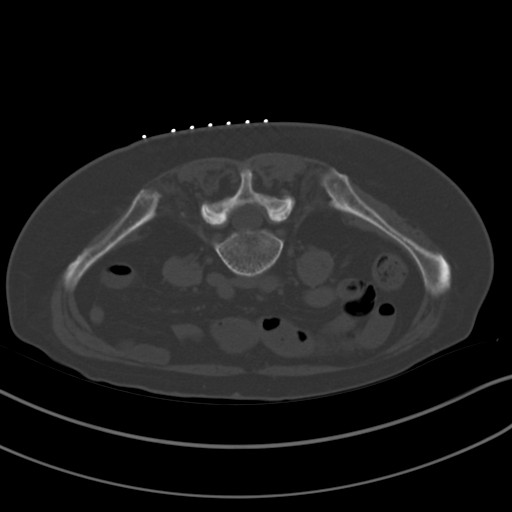
[im 24/30  soft-tissue]
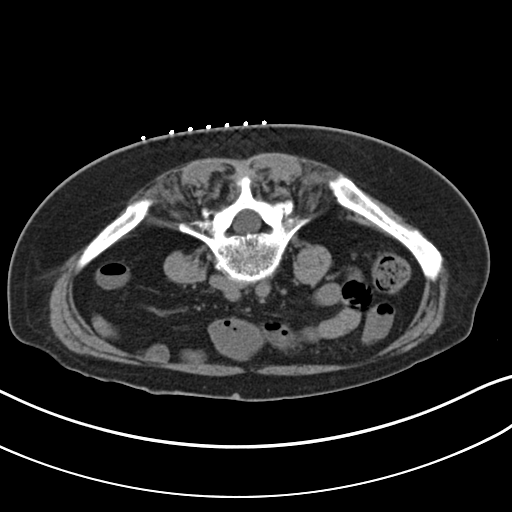
[im 24/30  bone]
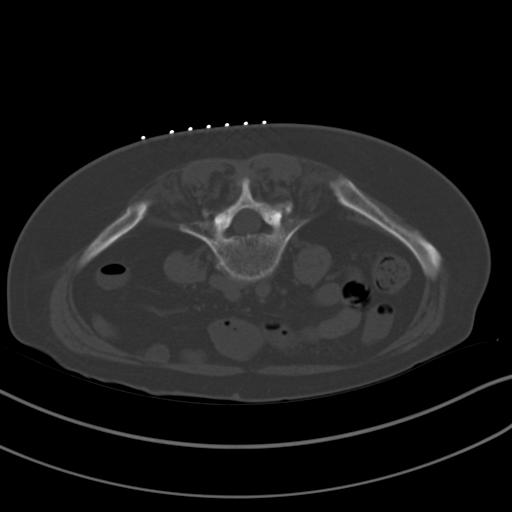
[im 27/30  bone]
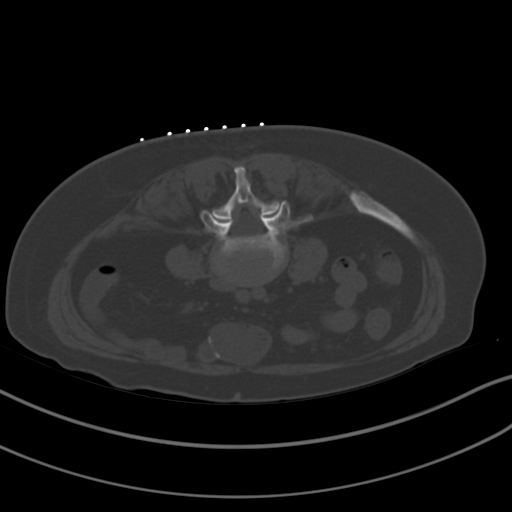

[10 of 14 positions shown; findings below may reference images not displayed]

Intravenous versed 1mg were administered as anxiolysis during
continuous monitoring of the patient's level of consciousness and
physiological / cardiorespiratory status by the radiology RN.

Under CT fluoroscopic guidance an 11-gauge Cook trocar bone needle
was advanced into the right iliac bone just lateral to the
sacroiliac joint. Once needle tip position was confirmed, core and
aspiration samples were obtained, submitted to pathology for
approval. Post procedure scans show no hematoma or fracture. Patient
tolerated procedure well.

COMPLICATIONS:
COMPLICATIONS
none
IMPRESSION: 1. Technically successful CT guided right iliac bone core and
aspiration biopsy.

## 2021-05-21 NOTE — Unmapped (Signed)
The [facility] has made a fourth and final attempt to reach this patient to refill the following medication:venetoclax.      We have left voicemails on the following phone numbers: [phone], [phone] and [phone] .    Dates contacted: 04/17/21 , 04/23/21, 05/07/21 and 05/21/21  Last scheduled delivery: 01/09/2021    The patient may be at risk of non-compliance with this medication. The patient should call the [facility] at [phone]  Option 4, then Option 1 (oncology) to refill medication.    Breck Coons [facility] Specialty Pharmacist

## 2021-05-21 NOTE — Unmapped (Signed)
Specialty Medication(s): venetoclax    [name] has been dis-enrolled from the [facility] specialty pharmacy services due to patient states she is not currently taking and medication has been discontinued in [place]..    Additional information provided to the patient: NA    Halli Equihua A [facility] Specialty Pharmacist

## 2021-05-22 ENCOUNTER — Inpatient Hospital Stay: Payer: PPO

## 2021-05-22 ENCOUNTER — Ambulatory Visit: Payer: PPO

## 2021-05-23 ENCOUNTER — Inpatient Hospital Stay: Payer: PPO

## 2021-05-23 ENCOUNTER — Ambulatory Visit: Payer: PPO

## 2021-05-26 ENCOUNTER — Other Ambulatory Visit: Payer: Self-pay

## 2021-05-26 ENCOUNTER — Ambulatory Visit: Payer: PPO

## 2021-05-26 ENCOUNTER — Ambulatory Visit (INDEPENDENT_AMBULATORY_CARE_PROVIDER_SITE_OTHER): Payer: PPO | Admitting: Internal Medicine

## 2021-05-26 ENCOUNTER — Other Ambulatory Visit: Payer: PPO

## 2021-05-26 VITALS — BP 120/68 | HR 87 | Temp 97.8°F | Resp 16 | Ht 64.0 in | Wt 125.8 lb

## 2021-05-26 DIAGNOSIS — F439 Reaction to severe stress, unspecified: Secondary | ICD-10-CM

## 2021-05-26 DIAGNOSIS — T451X5A Adverse effect of antineoplastic and immunosuppressive drugs, initial encounter: Secondary | ICD-10-CM

## 2021-05-26 DIAGNOSIS — F320 Major depressive disorder, single episode, mild: Secondary | ICD-10-CM

## 2021-05-26 DIAGNOSIS — Z85850 Personal history of malignant neoplasm of thyroid: Secondary | ICD-10-CM | POA: Diagnosis not present

## 2021-05-26 DIAGNOSIS — E559 Vitamin D deficiency, unspecified: Secondary | ICD-10-CM | POA: Diagnosis not present

## 2021-05-26 DIAGNOSIS — Z853 Personal history of malignant neoplasm of breast: Secondary | ICD-10-CM | POA: Diagnosis not present

## 2021-05-26 DIAGNOSIS — G620 Drug-induced polyneuropathy: Secondary | ICD-10-CM | POA: Diagnosis not present

## 2021-05-26 DIAGNOSIS — E039 Hypothyroidism, unspecified: Secondary | ICD-10-CM

## 2021-05-26 DIAGNOSIS — E7800 Pure hypercholesterolemia, unspecified: Secondary | ICD-10-CM

## 2021-05-26 DIAGNOSIS — M810 Age-related osteoporosis without current pathological fracture: Secondary | ICD-10-CM | POA: Diagnosis not present

## 2021-05-26 DIAGNOSIS — Z85038 Personal history of other malignant neoplasm of large intestine: Secondary | ICD-10-CM

## 2021-05-26 DIAGNOSIS — K219 Gastro-esophageal reflux disease without esophagitis: Secondary | ICD-10-CM | POA: Diagnosis not present

## 2021-05-26 DIAGNOSIS — R002 Palpitations: Secondary | ICD-10-CM

## 2021-05-26 DIAGNOSIS — C9200 Acute myeloblastic leukemia, not having achieved remission: Secondary | ICD-10-CM

## 2021-05-26 MED ORDER — AZELASTINE-FLUTICASONE 137-50 MCG/ACT NA SUSP
NASAL | 3 refills | Status: DC
Start: 2021-05-26 — End: 2022-07-06

## 2021-05-26 NOTE — Progress Notes (Signed)
Patient ID: Debra Hale, female   DOB: May 30, 1950, 71 y.o.   MRN: [id number]   Subjective:    Patient ID: Debra Hale, female    DOB: 1951-04-21, 71 y.o.   MRN: [id number]  This visit occurred during the SARS-CoV-2 public health emergency.  Safety protocols were in place, including screening questions prior to the visit, additional usage of staff PPE, and extensive cleaning of exam room while observing appropriate contact time as indicated for disinfecting solutions.   Patient here for a scheduled follow up.    HPI Here to follow up regarding increased stress, palpitations, hypothyroidism and currently being treated for AML.  Seeing Dr [name].  Reviewed oncology note - tolerating treatments relatively well without significant side effects.  Reports chronic weakness and fatigue.  Recently tripped over a curb.  Fell.  Evaluated in ER 04/29/21.  No LOC. CT head and cervical spine - negative for acute trauma.  CT left hip - no well defined acute fracture. Prescribed oxycodone. No significant residual pain or issues from the fall.  No chest pain reported.  Breathing stable.  Saw Dr Fletcher Anon 04/15/21 - f/u palpitations.  Stable.  Has metoprolol if needed.  No abdominal pain reported.     Past Medical History:  Diagnosis Date   Anxiety    Arthritis    Osteoarthritis   Breast cancer ([place]) 2009   right breast lumpectomy with rad tx   Colon cancer ([place])    surgery with chemo and rad tx   Complication of anesthesia    GERD (gastroesophageal reflux disease)    Heart palpitations    History of hiatal hernia    History of kidney stones    Hypothyroidism    Liver disease    Liver nodule    s/p negative biopsy   Malignant neoplasm of thyroid gland ([place]) 2002   s/p surgery and XRT   Osteoporosis    Other and unspecified hyperlipidemia    Palpitations    Personal history of chemotherapy    Personal history of malignant neoplasm of large intestine    carcinoma - cecum, s/p right laparoscopic  colectomy - s/p chemotherapy and XRT   Personal history of radiation therapy    Pneumonia 2019   PONV (postoperative nausea and vomiting)    Pure hypercholesterolemia    Unspecified hereditary and idiopathic peripheral neuropathy    Past Surgical History:  Procedure Laterality Date   APPENDECTOMY  1985   BREAST BIOPSY Right 2009   positive   BREAST BIOPSY Right 2009   negative   BREAST LUMPECTOMY Right 2009   breast cancer   CHOLECYSTECTOMY  1995   COLONOSCOPY     COLONOSCOPY WITH PROPOFOL N/A 10/29/2017   Procedure: COLONOSCOPY WITH PROPOFOL;  Surgeon: Manya Silvas, MD;  Location: [facility] ENDOSCOPY;  Service: Endoscopy;  Laterality: N/A;   DILATION AND CURETTAGE OF UTERUS  1990   DILATION AND CURETTAGE, DIAGNOSTIC / THERAPEUTIC  1990   ESOPHAGOGASTRODUODENOSCOPY     ESOPHAGOGASTRODUODENOSCOPY (EGD) WITH PROPOFOL N/A 10/29/2017   Procedure: ESOPHAGOGASTRODUODENOSCOPY (EGD) WITH PROPOFOL;  Surgeon: Manya Silvas, MD;  Location: [facility] ENDOSCOPY;  Service: Endoscopy;  Laterality: N/A;   INCISION AND DRAINAGE PERIRECTAL ABSCESS N/A 06/17/2020   Procedure: IRRIGATION AND DEBRIDEMENT PERIRECTAL ABSCESS;  Surgeon: Jules Husbands, MD;  Location: [facility] ORS;  Service: General;  Laterality: N/A;   LAPAROSCOPIC PARTIAL COLECTOMY     stage 3-C carcinoma of the cecum, s/p chemotherapy and xrt   LITHOTRIPSY  SIGMOIDOSCOPY  08/26/1993   THYROID LOBECTOMY  2002   s/p XRT   TOTAL HIP ARTHROPLASTY Right 10/24/2019   Procedure: TOTAL HIP ARTHROPLASTY;  Surgeon: Corky Mull, MD;  Location: [facility] ORS;  Service: Orthopedics;  Laterality: Right;   Family History  Problem Relation Age of Onset   Stroke Mother        53s   Alzheimer's disease Mother    Cancer Mother    Lung cancer Father    Prostate cancer Father    Cancer Father        Colon   Colon cancer Father    Breast cancer Sister        32's   Lung cancer Sister    Breast cancer Maternal Aunt    Social History   Socioeconomic  History   Marital status: Single    Spouse name: Not on file   Number of children: 0   Years of education: Not on file   Highest education level: Not on file  Occupational History   Not on file  Tobacco Use   Smoking status: Never   Smokeless tobacco: Never  Vaping Use   Vaping Use: Never used  Substance and Sexual Activity   Alcohol use: No    Alcohol/week: 0.0 standard drinks   Drug use: No   Sexual activity: Not Currently  Other Topics Concern   Not on file  Social History Narrative   Not on file   Social Determinants of Health   Financial Resource Strain: Not on file  Food Insecurity: Not on file  Transportation Needs: Not on file  Physical Activity: Not on file  Stress: Not on file  Social Connections: Not on file     Review of Systems  Constitutional:  Negative for appetite change and unexpected weight change.  HENT:  Negative for congestion and sinus pressure.   Respiratory:  Negative for cough, chest tightness and shortness of breath.   Cardiovascular:  Negative for chest pain, palpitations and leg swelling.  Gastrointestinal:  Negative for abdominal pain, diarrhea, nausea and vomiting.  Genitourinary:  Negative for difficulty urinating and dysuria.  Musculoskeletal:  Negative for joint swelling and myalgias.  Skin:  Negative for color change and rash.  Neurological:  Negative for dizziness, light-headedness and headaches.  Psychiatric/Behavioral:  Negative for agitation.        Increased stress and depression  - given her medical issues.        Objective:     BP 120/68    Pulse 87    Temp 97.8 F (36.6 C)    Resp 16    Ht 5\' 4"  (1.626 m)    Wt 125 lb 12.8 oz (57.1 kg)    SpO2 98%    BMI 21.59 kg/m  Wt Readings from Last 3 Encounters:  05/26/21 125 lb 12.8 oz (57.1 kg)  05/06/21 128 lb 8 oz (58.3 kg)  04/29/21 129 lb 3 oz (58.6 kg)    Physical Exam Vitals reviewed.  Constitutional:      General: She is not in acute distress.    Appearance:  Normal appearance.  HENT:     Head: Normocephalic and atraumatic.     Right Ear: External ear normal.     Left Ear: External ear normal.  Eyes:     General: No scleral icterus.       Right eye: No discharge.        Left eye: No discharge.     Conjunctiva/sclera:  Conjunctivae normal.  Neck:     Thyroid: No thyromegaly.  Cardiovascular:     Rate and Rhythm: Normal rate and regular rhythm.  Pulmonary:     Effort: No respiratory distress.     Breath sounds: Normal breath sounds. No wheezing.  Abdominal:     General: Bowel sounds are normal.     Palpations: Abdomen is soft.     Tenderness: There is no abdominal tenderness.  Musculoskeletal:        General: No swelling or tenderness.     Cervical back: Neck supple. No tenderness.  Lymphadenopathy:     Cervical: No cervical adenopathy.  Skin:    Findings: No erythema or rash.  Neurological:     Mental Status: She is alert.  Psychiatric:        Mood and Affect: Mood normal.        Behavior: Behavior normal.     Outpatient Encounter Medications as of 05/26/2021  Medication Sig   Azelastine-Fluticasone 137-50 MCG/ACT SUSP SPRAY 2 SPRAYS INTO EACH NOSTRIL EVERY DAY   clonazePAM (KLONOPIN) 0.5 MG tablet Take 1.5 mg by mouth 2 (two) times daily.    diphenoxylate-atropine (LOMOTIL) 2.5-0.025 MG tablet Take 1 tablet by mouth 4 (four) times daily as needed for diarrhea or loose stools.   escitalopram (LEXAPRO) 20 MG tablet Take 20 mg by mouth every morning.    levothyroxine (SYNTHROID) 125 MCG tablet TAKE 1 TABLET EVERY DAY ON EMPTY STOMACHWITH A GLASS OF WATER AT LEAST 30-60 MINBEFORE BREAKFAST   loperamide (IMODIUM) 2 MG capsule Take by mouth.   magnesium oxide (MAG-OX) 400 MG tablet Take 1 tablet (400 mg total) by mouth daily.   metoprolol tartrate (LOPRESSOR) 25 MG tablet TAKE 1 TABLET (25 MG TOTAL) BY MOUTH AS NEEDED (AS NEEDED UP TO TWICE DAILY FOR PALPITATIONS).   oxyCODONE-acetaminophen (PERCOCET/ROXICET) 5-325 MG tablet Take 1  tablet by mouth every 6 (six) hours as needed for severe pain.   pantoprazole (PROTONIX) 40 MG tablet TAKE ONE TABLET (40 MG) BY MOUTH EVERY DAY   potassium chloride SA (KLOR-CON) 20 MEQ tablet TAKE ONE (1) TABLET BY MOUTH TWO TIMES PER DAY   prochlorperazine (COMPAZINE) 10 MG tablet Take 1 tablet (10 mg total) by mouth every 6 (six) hours as needed for refractory nausea / vomiting.   valACYclovir (VALTREX) 500 MG tablet Take 1 tablet by mouth daily.   [DISCONTINUED] Azelastine-Fluticasone 137-50 MCG/ACT SUSP SPRAY 2 SPRAYS INTO EACH NOSTRIL EVERY DAY   Facility-Administered Encounter Medications as of 05/26/2021  Medication   ondansetron (ZOFRAN) tablet 8 mg   potassium chloride (KLOR-CON) CR tablet 40 mEq     Lab Results  Component Value Date   WBC 1.8 (L) 05/06/2021   HGB 12.8 05/06/2021   HCT 38.4 05/06/2021   PLT 154 05/06/2021   GLUCOSE 93 05/06/2021   CHOL 163 02/20/2020   TRIG 110.0 02/20/2020   HDL 40.70 02/20/2020   LDLDIRECT 126.0 07/11/2015   LDLCALC 100 (H) 02/20/2020   ALT 19 05/06/2021   AST 12 (L) 05/06/2021   NA 140 05/06/2021   K 3.6 05/06/2021   CL 105 05/06/2021   CREATININE 0.78 05/06/2021   BUN 20 05/06/2021   CO2 26 05/06/2021   TSH 0.034 (L) 10/02/2020   HGBA1C 5.3 12/10/2016    CT Head Wo Contrast  Result Date: 04/29/2021 CLINICAL DATA:  Tripped and fell, headache EXAM: CT HEAD WITHOUT CONTRAST TECHNIQUE: Contiguous axial images were obtained from the base of the skull through  the vertex without intravenous contrast. COMPARISON:  Brain MRI 05/12/2018 FINDINGS: Brain: There is no evidence of acute intracranial hemorrhage, extra-axial fluid collection, or acute infarct. Parenchymal volume is within normal limits. The ventricles are normal in size. There is mild chronic white matter microangiopathy. There is no mass lesion.  There is no midline shift. Vascular: There is calcification of the bilateral cavernous ICAs. Skull: Normal. Negative for fracture or  focal lesion. Sinuses/Orbits: There is complete opacification of the imaged right maxillary sinus and mucosal thickening in the right anterior ethmoid air cells. Bilateral lens implants are in place. The globes and orbits are otherwise unremarkable. Other: None. IMPRESSION: No acute intracranial pathology. Electronically Signed   By: Valetta Mole M.D.   On: 04/29/2021 14:31   CT Cervical Spine Wo Contrast  Result Date: 04/29/2021 CLINICAL DATA:  Tripped and fell, neck pain EXAM: CT CERVICAL SPINE WITHOUT CONTRAST TECHNIQUE: Multidetector CT imaging of the cervical spine was performed without intravenous contrast. Multiplanar CT image reconstructions were also generated. COMPARISON:  None. FINDINGS: Alignment: Normal. There is no antero or retrolisthesis. There is no jumped or perched facets or other evidence of traumatic malalignment. Skull base and vertebrae: Skull base alignment is maintained. Vertebral body heights are preserved. There is no evidence of acute fracture. Soft tissues and spinal canal: No prevertebral fluid or swelling. No visible canal hematoma. Disc levels: There is mild multilevel degenerative endplate change in the cervical spine. The osseous spinal canal is patent. There is moderate left neural foraminal stenosis C6-C7. Upper chest: The imaged lung apices are clear. Other: None. IMPRESSION: No acute fracture or traumatic malalignment of the cervical spine. Electronically Signed   By: Valetta Mole M.D.   On: 04/29/2021 14:36   CT Hip Left Wo Contrast  Result Date: 04/29/2021 CLINICAL DATA:  Pain left hip with ambulation, fall. EXAM: CT OF THE LEFT HIP WITHOUT CONTRAST TECHNIQUE: Multidetector CT imaging of the left hip was performed according to the standard protocol. Multiplanar CT image reconstructions were also generated. COMPARISON:  Radiographs 04/29/2021 and CT pelvis 06/16/2020 FINDINGS: Bones/Joint/Cartilage Substantial spurring of the left femoral head and acetabulum with  degenerative subcortical cysts in the acetabulum as well as an apparent geode of the acetabular roof which has increased in size, measuring about 1.5 by 0.7 cm on image 41 series 4 compared to previous 0.9 by 0.6 cm. 3 mm well corticated chronically fragmented spur along the left anterior superior acetabulum, the fragmented nature of this spur component is more clearly delineated on today's exam. Linear calcification along the anterior capsular margin in the hip joint on image 30 of series 2 appears to be new. No hip joint effusion.  Moderate degenerative chondral thinning. Ligaments Suboptimally assessed by CT. Muscles and Tendons Unremarkable Soft tissues Trace subcutaneous edema lateral to the left hip. IMPRESSION: 1. Subtle linear calcification along the anterior capsular margin of the hip not well seen on the prior exam, and could represent a small free osteochondral fragment. There is also some chronic fragmentation of the anterior acetabular spur. 2. Moderate osteoarthritis of the left hip with associated spurring and loss of articular space. Increase size of Schmorl's node in the left acetabular roof. 3. Mild subcutaneous edema lateral to the left hip. 4. No well-defined acute fracture. Electronically Signed   By: Van Clines M.D.   On: 04/29/2021 15:35   DG Hip Unilat W or Wo Pelvis 2-3 Views Left  Result Date: 04/29/2021 CLINICAL DATA:  Pain post fall EXAM: DG HIP (WITH  OR WITHOUT PELVIS) 2-3V LEFT COMPARISON:  CT 06/16/2020 and previous FINDINGS: Right hip arthroplasty components project in expected location. No fracture or dislocation. Mild narrowing of articular cartilage in the left hip with small marginal spurs from the femoral head. Bony pelvis intact. IMPRESSION: No acute findings Electronically Signed   By: Lucrezia Europe M.D.   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        Chemotherapy-induced neuropathy ([place])    Noticed since chemo.  Stable.       GERD (gastroesophageal reflux disease)    No upper symptoms reported. On protonix.       History of breast cancer    Mammogram 03/07/20 - birads I.  Overdue.       History of malignant neoplasm of large intestine    Colonoscopy 10/2017.  Being followed by GI. Was due f/u 2020.  Had colonoscopy scheduled.  Canceled due to hip surgery.  Overdue f/u.  Currently being treated for AML.  Persistent loose stool.  Have discussed f/u with GI.       History of thyroid cancer    Follow tsh.       Hypercholesterolemia    Follow lipid panel.       Hypothyroidism    On thyroid replacement.  Follow tsh.       Major depressive disorder, single episode, mild ([facility]) - Primary    Seeing Dr Clovis Riley.  Currently on lexapro and clonazepam.  Denies SI.  Follow.  Continue f/u with psychiatry.       Osteoporosis    Overdue bone density.  Need to schedule.        Palpitations    Followed by cardiology.  Last evaluated 04/15/21.   Stable.        Stress    Increased stress with her current medical issues.  Seeing Dr Clovis Riley.  Follow.  Notify me if feels needs any further intervention.        Vitamin D deficiency    Follow vitamin D level.         [name], MD

## 2021-05-27 ENCOUNTER — Ambulatory Visit: Payer: PPO | Admitting: Oncology

## 2021-05-27 ENCOUNTER — Ambulatory Visit: Payer: PPO

## 2021-05-28 ENCOUNTER — Ambulatory Visit: Payer: PPO

## 2021-05-29 ENCOUNTER — Ambulatory Visit: Payer: PPO

## 2021-05-30 ENCOUNTER — Ambulatory Visit: Payer: PPO

## 2021-06-01 ENCOUNTER — Telehealth: Payer: Self-pay | Admitting: Internal Medicine

## 2021-06-01 ENCOUNTER — Encounter: Payer: Self-pay | Admitting: Internal Medicine

## 2021-06-01 DIAGNOSIS — F320 Major depressive disorder, single episode, mild: Secondary | ICD-10-CM | POA: Insufficient documentation

## 2021-06-01 NOTE — Assessment & Plan Note (Signed)
Follow tsh.  

## 2021-06-01 NOTE — Assessment & Plan Note (Signed)
Colonoscopy 10/2017.  Being followed by GI. Was due f/u 2020.  Had colonoscopy scheduled.  Canceled due to hip surgery.  Overdue f/u.  Currently being treated for AML.  Persistent loose stool.  Have discussed f/u with GI.

## 2021-06-01 NOTE — Assessment & Plan Note (Signed)
Follow vitamin D level.  

## 2021-06-01 NOTE — Assessment & Plan Note (Signed)
No upper symptoms reported.  On protonix.   

## 2021-06-01 NOTE — Assessment & Plan Note (Signed)
Noticed since chemo.  Stable.

## 2021-06-01 NOTE — Assessment & Plan Note (Signed)
Followed and treated by hematology as outlined.

## 2021-06-01 NOTE — Assessment & Plan Note (Signed)
Follow lipid panel.   

## 2021-06-01 NOTE — Assessment & Plan Note (Signed)
Followed by cardiology.  Last evaluated 04/15/21.   Stable.

## 2021-06-01 NOTE — Assessment & Plan Note (Signed)
Increased stress with her current medical issues.  Seeing Dr Clovis Riley.  Follow.  Notify me if feels needs any further intervention.

## 2021-06-01 NOTE — Telephone Encounter (Signed)
Per review, she is overdue a bone density and mammogram.  If agreeable to schedule, need to schedule.  Also, is she still followed by GI?  Last colonoscopy 2019 per our records.  Per note, past due f/u.  If agreeable, will need to arrange f/u with [facility].

## 2021-06-01 NOTE — Assessment & Plan Note (Signed)
On thyroid replacement.  Follow tsh.  

## 2021-06-01 NOTE — Assessment & Plan Note (Signed)
Seeing Dr Levine.  Currently on lexapro and clonazepam.  Denies SI.  Follow.  Continue f/u with psychiatry.  

## 2021-06-01 NOTE — Assessment & Plan Note (Signed)
Mammogram 03/07/20 - birads I.  Overdue.

## 2021-06-01 NOTE — Assessment & Plan Note (Signed)
Overdue bone density.  Need to schedule.

## 2021-06-02 ENCOUNTER — Inpatient Hospital Stay: Payer: PPO

## 2021-06-02 ENCOUNTER — Other Ambulatory Visit: Payer: Self-pay

## 2021-06-02 VITALS — BP 130/70 | HR 72 | Temp 97.4°F | Resp 16

## 2021-06-02 DIAGNOSIS — Z5111 Encounter for antineoplastic chemotherapy: Secondary | ICD-10-CM | POA: Diagnosis not present

## 2021-06-02 DIAGNOSIS — C9200 Acute myeloblastic leukemia, not having achieved remission: Secondary | ICD-10-CM

## 2021-06-02 LAB — COMPREHENSIVE METABOLIC PANEL
ALT: 17 U/L (ref 0–44)
AST: 12 U/L — ABNORMAL LOW (ref 15–41)
Albumin: 3.5 g/dL (ref 3.5–5.0)
Alkaline Phosphatase: 139 U/L — ABNORMAL HIGH (ref 38–126)
Anion gap: 10 (ref 5–15)
BUN: 16 mg/dL (ref 8–23)
CO2: 29 mmol/L (ref 22–32)
Calcium: 8.7 mg/dL — ABNORMAL LOW (ref 8.9–10.3)
Chloride: 101 mmol/L (ref 98–111)
Creatinine, Ser: 0.86 mg/dL (ref 0.44–1.00)
GFR, Estimated: >60 mL/min (ref >60)
Glucose, Bld: 108 mg/dL — ABNORMAL HIGH (ref 70–99)
Potassium: 3.4 mmol/L — ABNORMAL LOW (ref 3.5–5.1)
Sodium: 140 mmol/L (ref 135–145)
Total Bilirubin: 0.5 mg/dL (ref 0.3–1.2)
Total Protein: 6.8 g/dL (ref 6.5–8.1)

## 2021-06-02 LAB — CBC WITH DIFFERENTIAL/PLATELET
Abs Immature Granulocytes: 0.01 10*3/uL (ref 0.00–0.07)
Basophils Absolute: 0 10*3/uL (ref 0.0–0.1)
Basophils Relative: 1 %
Eosinophils Absolute: 0 10*3/uL (ref 0.0–0.5)
Eosinophils Relative: 1 %
HCT: 38.7 % (ref 36.0–46.0)
Hemoglobin: 13 g/dL (ref 12.0–15.0)
Immature Granulocytes: 1 %
Lymphocytes Relative: 27 %
Lymphs Abs: 0.6 10*3/uL — ABNORMAL LOW (ref 0.7–4.0)
MCH: 27.8 pg (ref 26.0–34.0)
MCHC: 33.6 g/dL (ref 30.0–36.0)
MCV: 82.9 fL (ref 80.0–100.0)
Monocytes Absolute: 0.1 10*3/uL (ref 0.1–1.0)
Monocytes Relative: 6 %
Neutro Abs: 1.4 10*3/uL — ABNORMAL LOW (ref 1.7–7.7)
Neutrophils Relative %: 64 %
Platelets: 159 10*3/uL (ref 150–400)
RBC: 4.67 MIL/uL (ref 3.87–5.11)
RDW: 14.2 % (ref 11.5–15.5)
WBC: 2.1 10*3/uL — ABNORMAL LOW (ref 4.0–10.5)
nRBC: 0 % (ref 0.0–0.2)

## 2021-06-02 MED ORDER — AZACITIDINE CHEMO SQ INJECTION
50.0000 mg/m2 | Freq: Once | INTRAMUSCULAR | Status: AC
  Administered 2021-06-02: 87.5 mg via SUBCUTANEOUS
  Filled 2021-06-02: qty 3.5

## 2021-06-02 MED ORDER — ONDANSETRON HCL 4 MG PO TABS
8.0000 mg | ORAL_TABLET | Freq: Once | ORAL | Status: AC
  Administered 2021-06-02: 8 mg via ORAL
  Filled 2021-06-02: qty 2

## 2021-06-02 NOTE — Telephone Encounter (Signed)
LMTCB

## 2021-06-02 NOTE — Patient Instructions (Signed)
[facility] CANCER CTR AT [place]  Discharge Instructions: Thank you for choosing [place] to provide your oncology and hematology care.  If you have a lab appointment with the [place], please go directly to the [place] and check in at the registration area.  Wear comfortable clothing and clothing appropriate for easy access to any Portacath or PICC line.   We strive to give you quality time with your provider. You may need to reschedule your appointment if you arrive late (15 or more minutes).  Arriving late affects you and other patients whose appointments are after yours.  Also, if you miss three or more appointments without notifying the office, you may be dismissed from the clinic at the providers discretion.      For prescription refill requests, have your pharmacy contact our office and allow 72 hours for refills to be completed.    Today you received the following chemotherapy and/or immunotherapy agents vidaza      To help prevent nausea and vomiting after your treatment, we encourage you to take your nausea medication as directed.  BELOW ARE SYMPTOMS THAT SHOULD BE REPORTED IMMEDIATELY: *FEVER GREATER THAN 100.4 F (38 C) OR HIGHER *CHILLS OR SWEATING *NAUSEA AND VOMITING THAT IS NOT CONTROLLED WITH YOUR NAUSEA MEDICATION *UNUSUAL SHORTNESS OF BREATH *UNUSUAL BRUISING OR BLEEDING *URINARY PROBLEMS (pain or burning when urinating, or frequent urination) *BOWEL PROBLEMS (unusual diarrhea, constipation, pain near the anus) TENDERNESS IN MOUTH AND THROAT WITH OR WITHOUT PRESENCE OF ULCERS (sore throat, sores in mouth, or a toothache) UNUSUAL RASH, SWELLING OR PAIN  UNUSUAL VAGINAL DISCHARGE OR ITCHING   Items with * indicate a potential emergency and should be followed up as soon as possible or go to the Emergency Department if any problems should occur.  Please show the CHEMOTHERAPY ALERT CARD or IMMUNOTHERAPY ALERT CARD at check-in to the  Emergency Department and triage nurse.  Should you have questions after your visit or need to cancel or reschedule your appointment, please contact [facility] CANCER [place] AT [place]  [phone] and follow the prompts.  Office hours are 8:00 a.m. to 4:30 p.m. Monday - Friday. Please note that voicemails left after 4:00 p.m. may not be returned until the following business day.  We are closed weekends and major holidays. You have access to a nurse at all times for urgent questions. Please call the main number to the clinic [phone] and follow the prompts.  For any non-urgent questions, you may also contact your provider using MyChart. We now offer e-Visits for anyone 1 and older to request care online for non-urgent symptoms. For details visit [email or web address].   Also download the MyChart app! Go to the app store, search "MyChart", open the app, select [place], and log in with your MyChart username and password.  Due to Covid, a mask is required upon entering the hospital/clinic. If you do not have a mask, one will be given to you upon arrival. For doctor visits, patients may have 1 support person aged 11 or older with them. For treatment visits, patients cannot have anyone with them due to current Covid guidelines and our immunocompromised population.   Azacitidine suspension for injection (subcutaneous use) What is this medication? AZACITIDINE (ay [place]) is a chemotherapy drug. This medicine reduces the growth of cancer cells and can suppress the immune system. It is used for treating myelodysplastic syndrome or some types of leukemia. This medicine may be used for other purposes; ask  your health care provider or pharmacist if you have questions. COMMON BRAND NAME(S): Vidaza What should I tell my care team before I take this medication? They need to know if you have any of these conditions: kidney disease liver disease liver tumors an unusual or allergic  reaction to azacitidine, mannitol, other medicines, foods, dyes, or preservatives pregnant or trying to get pregnant breast-feeding How should I use this medication? This medicine is for injection under the skin. It is administered in a hospital or clinic by a specially trained health care professional. Talk to your pediatrician regarding the use of this medicine in children. While this drug may be prescribed for selected conditions, precautions do apply. Overdosage: If you think you have taken too much of this medicine contact a poison control center or emergency room at once. NOTE: This medicine is only for you. Do not share this medicine with others. What if I miss a dose? It is important not to miss your dose. Call your doctor or health care professional if you are unable to keep an appointment. What may interact with this medication? Interactions have not been studied. Give your health care provider a list of all the medicines, herbs, non-prescription drugs, or dietary supplements you use. Also tell them if you smoke, drink alcohol, or use illegal drugs. Some items may interact with your medicine. This list may not describe all possible interactions. Give your health care provider a list of all the medicines, herbs, non-prescription drugs, or dietary supplements you use. Also tell them if you smoke, drink alcohol, or use illegal drugs. Some items may interact with your medicine. What should I watch for while using this medication? Visit your doctor for checks on your progress. This drug may make you feel generally unwell. This is not uncommon, as chemotherapy can affect healthy cells as well as cancer cells. Report any side effects. Continue your course of treatment even though you feel ill unless your doctor tells you to stop. In some cases, you may be given additional medicines to help with side effects. Follow all directions for their use. Call your doctor or health care professional for  advice if you get a fever, chills or sore throat, or other symptoms of a cold or flu. Do not treat yourself. This drug decreases your body's ability to fight infections. Try to avoid being around people who are sick. This medicine may increase your risk to bruise or bleed. Call your doctor or health care professional if you notice any unusual bleeding. You may need blood work done while you are taking this medicine. Do not become pregnant while taking this medicine and for 6 months after the last dose. Women should inform their doctor if they wish to become pregnant or think they might be pregnant. Men should not father a child while taking this medicine and for 3 months after the last dose. There is a potential for serious side effects to an unborn child. Talk to your health care professional or pharmacist for more information. Do not breast-feed an infant while taking this medicine and for 1 week after the last dose. This medicine may interfere with the ability to have a child. Talk with your doctor or health care professional if you are concerned about your fertility. What side effects may I notice from receiving this medication? Side effects that you should report to your doctor or health care professional as soon as possible: allergic reactions like skin rash, itching or hives, swelling of the face, lips,  or tongue low blood counts - this medicine may decrease the number of white blood cells, red blood cells and platelets. You may be at increased risk for infections and bleeding. signs of infection - fever or chills, cough, sore throat, pain passing urine signs of decreased platelets or bleeding - bruising, pinpoint red spots on the skin, black, tarry stools, blood in the urine signs of decreased red blood cells - unusually weak or tired, fainting spells, lightheadedness signs and symptoms of kidney injury like trouble passing urine or change in the amount of urine signs and symptoms of liver  injury like dark yellow or brown urine; general ill feeling or flu-like symptoms; light-colored stools; loss of appetite; nausea; right upper belly pain; unusually weak or tired; yellowing of the eyes or skin Side effects that usually do not require medical attention (report to your doctor or health care professional if they continue or are bothersome): constipation diarrhea nausea, vomiting pain or redness at the injection site unusually weak or tired This list may not describe all possible side effects. Call your doctor for medical advice about side effects. You may report side effects to FDA at 1-800-FDA-1088. Where should I keep my medication? This drug is given in a hospital or clinic and will not be stored at home. NOTE: This sheet is a summary. It may not cover all possible information. If you have questions about this medicine, talk to your doctor, pharmacist, or health care provider.  2022 Elsevier/Gold Standard (2016-05-20 00:00:00)

## 2021-06-02 NOTE — Progress Notes (Signed)
Pt tolerated vidaza injections today with no problems or complaints.  Pt left infusion suite stable and ambulatory.

## 2021-06-03 ENCOUNTER — Inpatient Hospital Stay: Payer: PPO

## 2021-06-03 ENCOUNTER — Telehealth: Payer: Self-pay | Admitting: Internal Medicine

## 2021-06-03 ENCOUNTER — Inpatient Hospital Stay (HOSPITAL_BASED_OUTPATIENT_CLINIC_OR_DEPARTMENT_OTHER): Payer: PPO | Admitting: Nurse Practitioner

## 2021-06-03 VITALS — BP 139/59 | HR 72 | Resp 18 | Wt 127.0 lb

## 2021-06-03 DIAGNOSIS — C9200 Acute myeloblastic leukemia, not having achieved remission: Secondary | ICD-10-CM | POA: Diagnosis not present

## 2021-06-03 DIAGNOSIS — Z1231 Encounter for screening mammogram for malignant neoplasm of breast: Secondary | ICD-10-CM

## 2021-06-03 DIAGNOSIS — Z5111 Encounter for antineoplastic chemotherapy: Secondary | ICD-10-CM

## 2021-06-03 MED ORDER — AZACITIDINE CHEMO SQ INJECTION
50.0000 mg/m2 | Freq: Once | INTRAMUSCULAR | Status: AC
  Administered 2021-06-03: 87.5 mg via SUBCUTANEOUS
  Filled 2021-06-03: qty 3.5

## 2021-06-03 MED ORDER — ONDANSETRON HCL 4 MG PO TABS
8.0000 mg | ORAL_TABLET | Freq: Once | ORAL | Status: AC
  Administered 2021-06-03: 8 mg via ORAL
  Filled 2021-06-03: qty 2

## 2021-06-03 NOTE — Progress Notes (Signed)
[place]  [phone] [phone]  ID: Debra Hale OB: 20-Jun-1950  MR#: [id number]  CSN#:[id number]  Patient Care Team: [name], MD as PCP - General (Internal Medicine) Wellington Hampshire, MD as PCP - Cardiology (Cardiology)  CHIEF COMPLAINT: AML  INTERVAL HISTORY: Patient returns to clinic today for further evaluation and continuation of maintenance Vidaza. She has not had any additional falls. Continues to have chronic weakness and fatigue but feels this is stable. Tolerating her treatment well without significant side effects. She denies any pain.  She has no neurologic complaints.  She denies any recent fevers or illnesses. She has no chest pain, shortness of breath, cough, or hemoptysis.  She denies any nausea, vomiting, constipation, or diarrhea.  She has no urinary complaints.  Patient offers no further specific complaints today.  REVIEW OF SYSTEMS:   Review of Systems  Constitutional:  Positive for malaise/fatigue. Negative for fever and weight loss.  Respiratory: Negative.  Negative for cough, hemoptysis and shortness of breath.   Cardiovascular: Negative.  Negative for chest pain and leg swelling.  Gastrointestinal:  Negative for abdominal pain and diarrhea.  Genitourinary: Negative.  Negative for dysuria.  Musculoskeletal:  Negative for back pain and falls.  Skin: Negative.  Negative for rash.  Neurological:  Positive for weakness. Negative for dizziness, focal weakness and headaches.  Psychiatric/Behavioral: Negative.  Negative for memory loss. The patient is not nervous/anxious.    As per HPI. Otherwise, a complete review of systems is negative.  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   s/p surgery and XRT   Osteoporosis    Other and unspecified hyperlipidemia    Palpitations    Personal history of chemotherapy    Personal history of malignant neoplasm of large intestine    carcinoma - cecum, s/p right laparoscopic colectomy - s/p chemotherapy and XRT   Personal history of radiation therapy    Pneumonia 2019   PONV (postoperative nausea and vomiting)    Pure hypercholesterolemia    Unspecified hereditary and idiopathic peripheral neuropathy     PAST SURGICAL HISTORY: Past Surgical History:  Procedure Laterality Date   APPENDECTOMY  1985   BREAST BIOPSY Right 2009   positive   BREAST BIOPSY Right 2009   negative   BREAST LUMPECTOMY Right 2009   breast cancer   CHOLECYSTECTOMY  1995   COLONOSCOPY     COLONOSCOPY WITH PROPOFOL N/A 10/29/2017   Procedure: COLONOSCOPY WITH PROPOFOL;  Surgeon: Manya Silvas, MD;  Location: [facility] ENDOSCOPY;  Service: Endoscopy;  Laterality: N/A;   DILATION AND CURETTAGE OF UTERUS  1990   DILATION AND CURETTAGE, DIAGNOSTIC / THERAPEUTIC  1990   ESOPHAGOGASTRODUODENOSCOPY     ESOPHAGOGASTRODUODENOSCOPY (EGD) WITH PROPOFOL N/A 10/29/2017   Procedure: ESOPHAGOGASTRODUODENOSCOPY (EGD) WITH PROPOFOL;  Surgeon: Manya Silvas, MD;  Location: [facility] ENDOSCOPY;  Service: Endoscopy;  Laterality: N/A;   INCISION AND  DRAINAGE PERIRECTAL ABSCESS N/A 06/17/2020   Procedure: IRRIGATION AND DEBRIDEMENT PERIRECTAL ABSCESS;  Surgeon: Jules Husbands, MD;  Location: [facility] ORS;  Service: General;  Laterality: N/A;   LAPAROSCOPIC PARTIAL COLECTOMY     stage 3-C carcinoma of the cecum, s/p chemotherapy and xrt   LITHOTRIPSY     SIGMOIDOSCOPY  08/26/1993   THYROID LOBECTOMY  2002   s/p XRT   TOTAL HIP ARTHROPLASTY  Right 10/24/2019   Procedure: TOTAL HIP ARTHROPLASTY;  Surgeon: Corky Mull, MD;  Location: [facility] ORS;  Service: Orthopedics;  Laterality: Right;    FAMILY HISTORY: Family History  Problem Relation Age of Onset   Stroke Mother        32s   Alzheimer's disease Mother    Cancer Mother    Lung cancer Father    Prostate cancer Father    Cancer Father        Colon   Colon cancer Father    Breast cancer Sister        2's   Lung cancer Sister    Breast cancer Maternal Aunt     ADVANCED DIRECTIVES (Y/N):  N  HEALTH MAINTENANCE: Social History   Tobacco Use   Smoking status: Never   Smokeless tobacco: Never  Vaping Use   Vaping Use: Never used  Substance Use Topics   Alcohol use: No    Alcohol/week: 0.0 standard drinks   Drug use: No     Colonoscopy:  PAP:  Bone density:  Lipid panel:  Allergies  Allergen Reactions   Demeclocycline Other (See Comments)    Throat swells   Sulfa Antibiotics Other (See Comments)    Other reaction(s): Other (See Comments) Throat swells Other reaction(s): Other (See Comments) Throat swells   Tetracyclines & Related Other (See Comments)    Throat swells    Augmentin [Amoxicillin-Pot Clavulanate] Diarrhea   Bentyl [Dicyclomine Hcl]     unkn   Ciprofloxacin Diarrhea   Codeine Other (See Comments)    dizziness     Dicyclomine Hcl Other (See Comments)    unkn   Epinephrine     Speeds heart up - panic    Flagyl [Metronidazole] Nausea And Vomiting   Librax [Chlordiazepoxide-Clidinium]     unkn   Novocain [Procaine] Other (See Comments)    "Shaky"   Phenobarbital     unkn   Prednisone     Nervous     Ultram [Tramadol] Other (See Comments)    Sick feeling    Current Outpatient Medications  Medication Sig Dispense Refill   Azelastine-Fluticasone 137-50 MCG/ACT SUSP SPRAY 2 SPRAYS INTO EACH NOSTRIL EVERY DAY 23 g 3   clonazePAM (KLONOPIN) 0.5 MG tablet Take 1.5 mg by mouth 2 (two) times daily.   0   diphenoxylate-atropine  (LOMOTIL) 2.5-0.025 MG tablet Take 1 tablet by mouth 4 (four) times daily as needed for diarrhea or loose stools. 120 tablet 1   escitalopram (LEXAPRO) 20 MG tablet Take 20 mg by mouth every morning.      levothyroxine (SYNTHROID) 125 MCG tablet TAKE 1 TABLET EVERY DAY ON EMPTY STOMACHWITH A GLASS OF WATER AT LEAST 30-60 MINBEFORE BREAKFAST 90 tablet 1   loperamide (IMODIUM) 2 MG capsule Take by mouth.     magnesium oxide (MAG-OX) 400 MG tablet Take 1 tablet (400 mg total) by mouth daily. 30 tablet 0   metoprolol tartrate (LOPRESSOR) 25 MG tablet TAKE 1 TABLET (25 MG TOTAL) BY MOUTH AS NEEDED (AS NEEDED UP TO TWICE  DAILY FOR PALPITATIONS). 60 tablet 1   oxyCODONE-acetaminophen (PERCOCET/ROXICET) 5-325 MG tablet Take 1 tablet by mouth every 6 (six) hours as needed for severe pain. 16 tablet 0   pantoprazole (PROTONIX) 40 MG tablet TAKE ONE TABLET (40 MG) BY MOUTH EVERY DAY 90 tablet 2   potassium chloride SA (KLOR-CON) 20 MEQ tablet TAKE ONE (1) TABLET BY MOUTH TWO TIMES PER DAY 60 tablet 0   prochlorperazine (COMPAZINE) 10 MG tablet Take 1 tablet (10 mg total) by mouth every 6 (six) hours as needed for refractory nausea / vomiting. 30 tablet 0   valACYclovir (VALTREX) 500 MG tablet Take 1 tablet by mouth daily.     Current Facility-Administered Medications  Medication Dose Route Frequency Provider Last Rate Last Admin   ondansetron (ZOFRAN) tablet 8 mg  8 mg Oral Once Guse, Jacquelynn Cree, FNP       Facility-Administered Medications Ordered in Other Visits  Medication Dose Route Frequency Provider Last Rate Last Admin   potassium chloride (KLOR-CON) CR tablet 40 mEq  40 mEq Oral BID Lloyd Huger, MD   40 mEq at 07/08/20 1136    OBJECTIVE: Vitals:   06/03/21 1313 06/03/21 1314  BP:  (!) 139/59  Pulse:  72  Resp: 18   SpO2:  100%     Body mass index is 21.8 kg/m.    ECOG FS:0 - Asymptomatic  General: Well-developed, well-nourished, no acute distress. Eyes: Pink conjunctiva, anicteric  sclera. Lungs: Clear to auscultation bilaterally.  No audible wheezing or coughing Heart: Regular rate and rhythm.  Abdomen: Soft, nontender, nondistended.  Musculoskeletal: No edema, cyanosis, or clubbing. Neuro: Alert, answering all questions appropriately. Cranial nerves grossly intact. Skin: No rashes or petechiae noted. Psych: Normal affect.   LAB RESULTS:  Lab Results  Component Value Date   NA 140 06/02/2021   K 3.4 (L) 06/02/2021   CL 101 06/02/2021   CO2 29 06/02/2021   GLUCOSE 108 (H) 06/02/2021   BUN 16 06/02/2021   CREATININE 0.86 06/02/2021   CALCIUM 8.7 (L) 06/02/2021   PROT 6.8 06/02/2021   ALBUMIN 3.5 06/02/2021   AST 12 (L) 06/02/2021   ALT 17 06/02/2021   ALKPHOS 139 (H) 06/02/2021   BILITOT 0.5 06/02/2021   GFRNONAA >60 06/02/2021   GFRAA 54 (L) 10/26/2019    Lab Results  Component Value Date   WBC 2.1 (L) 06/02/2021   NEUTROABS 1.4 (L) 06/02/2021   HGB 13.0 06/02/2021   HCT 38.7 06/02/2021   MCV 82.9 06/02/2021   PLT 159 06/02/2021   STUDIES: No results found.   ASSESSMENT: AML.  PLAN:    1.  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  Her peripheral blood flow cytometry also revealed 6% aberrant myeloblasts.  Patient completed cycle 1 of induction therapy at [facility] with Vidaza and venetoclax.  She only received 5 days of Vidaza for cycle 2 recently which was interrupted secondary to significant diarrhea and a perirectal abscess.  Repeat bone marrow biopsy on January 02, 2021 did not reveal any overt disease, but cytogenetics remained positive likely indicating a very good partial remission.  Patient has been instructed to discontinue venetoclax.  Will continue maintenance treatment with Vidaza on days 1 through 5 every 28 days.  Proceed with cycle 13 of treatment today.  Return to clinic daily for the remainder of the week for treatment and then in 4 weeks for further evaluation and consideration of cycle 14. 2.  Neutropenia: Chronic and unchanged.   Bone marrow  biopsy as above. 3.  Thrombocytopenia: Resolved.  Discontinue venetoclax as above.   4.  Perirectal abscess: Resolved.   5.  Diarrhea: Intermittent.  Continue Lomotil as needed. 6.  Supportive care: Patient previously was given referrals to home health and home palliative care.  Appreciate palliative care input.   7.  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  Patient also received chemotherapy and likely received Adriamycin, but exact regimen is unknown.  She completed 5 years of hormonal therapy in approximately June 2015. Currently, she has no evidence of disease.  Her most recent mammogram on March 05, 2020 was reported as BI-RADS 1. Recommended she contact her pcp to order and schedule.  8.  History of colon cancer: Patient also states that she received chemotherapy for this, possibly FOLFOX but again this is unknown.  4 weeks- labs, [place], vidaza on days 1-5- la  Patient expressed understanding and was in agreement with this plan. She also understands that She can call clinic at any time with any questions, concerns, or complaints.    Cancer Staging  History of breast cancer Staging form: Breast, AJCC 7th Edition - Clinical stage from 01/07/2016: Stage IA (T1c, N0, M0) - Signed by Lloyd Huger, MD on 01/07/2016 Laterality: Right Estrogen receptor status: Positive Progesterone receptor status: Positive HER2 status: Negative   Verlon Au, NP   06/03/2021   CC: Dr. Nicki Reaper

## 2021-06-03 NOTE — Telephone Encounter (Signed)
Received notification from cancer center that Debra Hale is overdue mammogram. They were asking if we could schedule.  I have placed order for mammogram.  Please schedule.  Thanks.

## 2021-06-03 NOTE — Patient Instructions (Signed)
[facility] CANCER CTR AT [place]  Discharge Instructions: Thank you for choosing [facility] to provide your oncology and hematology care.  If you have a lab appointment with the [employer], please go directly to the [facility] and check in at the registration area.  Wear comfortable clothing and clothing appropriate for easy access to any Portacath or PICC line.   We strive to give you quality time with your provider. You may need to reschedule your appointment if you arrive late (15 or more minutes).  Arriving late affects you and other patients whose appointments are after yours.  Also, if you miss three or more appointments without notifying the office, you may be dismissed from the clinic at the providers discretion.      For prescription refill requests, have your pharmacy contact our office and allow 72 hours for refills to be completed.    Today you received the following chemotherapy and/or immunotherapy agents Vidaza      To help prevent nausea and vomiting after your treatment, we encourage you to take your nausea medication as directed.  BELOW ARE SYMPTOMS THAT SHOULD BE REPORTED IMMEDIATELY: *FEVER GREATER THAN 100.4 F (38 C) OR HIGHER *CHILLS OR SWEATING *NAUSEA AND VOMITING THAT IS NOT CONTROLLED WITH YOUR NAUSEA MEDICATION *UNUSUAL SHORTNESS OF BREATH *UNUSUAL BRUISING OR BLEEDING *URINARY PROBLEMS (pain or burning when urinating, or frequent urination) *BOWEL PROBLEMS (unusual diarrhea, constipation, pain near the anus) TENDERNESS IN MOUTH AND THROAT WITH OR WITHOUT PRESENCE OF ULCERS (sore throat, sores in mouth, or a toothache) UNUSUAL RASH, SWELLING OR PAIN  UNUSUAL VAGINAL DISCHARGE OR ITCHING   Items with * indicate a potential emergency and should be followed up as soon as possible or go to the Emergency Department if any problems should occur.  Please show the CHEMOTHERAPY ALERT CARD or IMMUNOTHERAPY ALERT CARD at check-in to the  Emergency Department and triage nurse.  Should you have questions after your visit or need to cancel or reschedule your appointment, please contact [facility] CANCER [place] AT [place]  [phone] and follow the prompts.  Office hours are 8:00 a.m. to 4:30 p.m. Monday - Friday. Please note that voicemails left after 4:00 p.m. may not be returned until the following business day.  We are closed weekends and major holidays. You have access to a nurse at all times for urgent questions. Please call the main number to the clinic [phone] and follow the prompts.  For any non-urgent questions, you may also contact your provider using MyChart. We now offer e-Visits for anyone 4 and older to request care online for non-urgent symptoms. For details visit [email or web address].   Also download the MyChart app! Go to the app store, search "MyChart", open the app, select , and log in with your MyChart username and password.  Due to Covid, a mask is required upon entering the hospital/clinic. If you do not have a mask, one will be given to you upon arrival. For doctor visits, patients may have 1 support person aged 60 or older with them. For treatment visits, patients cannot have anyone with them due to current Covid guidelines and our immunocompromised population.

## 2021-06-04 ENCOUNTER — Inpatient Hospital Stay: Payer: PPO | Attending: Oncology

## 2021-06-04 ENCOUNTER — Other Ambulatory Visit: Payer: Self-pay

## 2021-06-04 VITALS — BP 134/68 | HR 78 | Temp 97.0°F

## 2021-06-04 DIAGNOSIS — F32A Depression, unspecified: Secondary | ICD-10-CM | POA: Diagnosis not present

## 2021-06-04 DIAGNOSIS — Z5111 Encounter for antineoplastic chemotherapy: Secondary | ICD-10-CM | POA: Insufficient documentation

## 2021-06-04 DIAGNOSIS — Z79899 Other long term (current) drug therapy: Secondary | ICD-10-CM | POA: Insufficient documentation

## 2021-06-04 DIAGNOSIS — R531 Weakness: Secondary | ICD-10-CM | POA: Insufficient documentation

## 2021-06-04 DIAGNOSIS — Z85850 Personal history of malignant neoplasm of thyroid: Secondary | ICD-10-CM | POA: Diagnosis not present

## 2021-06-04 DIAGNOSIS — Z7983 Long term (current) use of bisphosphonates: Secondary | ICD-10-CM | POA: Diagnosis not present

## 2021-06-04 DIAGNOSIS — E7800 Pure hypercholesterolemia, unspecified: Secondary | ICD-10-CM | POA: Diagnosis not present

## 2021-06-04 DIAGNOSIS — M1990 Unspecified osteoarthritis, unspecified site: Secondary | ICD-10-CM | POA: Diagnosis not present

## 2021-06-04 DIAGNOSIS — Z853 Personal history of malignant neoplasm of breast: Secondary | ICD-10-CM | POA: Insufficient documentation

## 2021-06-04 DIAGNOSIS — Z85038 Personal history of other malignant neoplasm of large intestine: Secondary | ICD-10-CM | POA: Diagnosis not present

## 2021-06-04 DIAGNOSIS — C9200 Acute myeloblastic leukemia, not having achieved remission: Secondary | ICD-10-CM | POA: Diagnosis not present

## 2021-06-04 DIAGNOSIS — K219 Gastro-esophageal reflux disease without esophagitis: Secondary | ICD-10-CM | POA: Insufficient documentation

## 2021-06-04 DIAGNOSIS — R5383 Other fatigue: Secondary | ICD-10-CM | POA: Insufficient documentation

## 2021-06-04 DIAGNOSIS — E039 Hypothyroidism, unspecified: Secondary | ICD-10-CM | POA: Insufficient documentation

## 2021-06-04 DIAGNOSIS — F419 Anxiety disorder, unspecified: Secondary | ICD-10-CM | POA: Diagnosis not present

## 2021-06-04 MED ORDER — ONDANSETRON HCL 4 MG PO TABS
8.0000 mg | ORAL_TABLET | Freq: Once | ORAL | Status: AC
  Administered 2021-06-04: 8 mg via ORAL
  Filled 2021-06-04: qty 2

## 2021-06-04 MED ORDER — AZACITIDINE CHEMO SQ INJECTION
50.0000 mg/m2 | Freq: Once | INTRAMUSCULAR | Status: AC
  Administered 2021-06-04: 87.5 mg via SUBCUTANEOUS
  Filled 2021-06-04: qty 3.5

## 2021-06-04 NOTE — Patient Instructions (Signed)
[facility] CANCER CTR AT [place]  Discharge Instructions: Thank you for choosing [facility] to provide your oncology and hematology care.  If you have a lab appointment with the [facility], please go directly to the [facility] and check in at the registration area.  Wear comfortable clothing and clothing appropriate for easy access to any Portacath or PICC line.   We strive to give you quality time with your provider. You may need to reschedule your appointment if you arrive late (15 or more minutes).  Arriving late affects you and other patients whose appointments are after yours.  Also, if you miss three or more appointments without notifying the office, you may be dismissed from the clinic at the providers discretion.      For prescription refill requests, have your pharmacy contact our office and allow 72 hours for refills to be completed.    Today you received the following chemotherapy and/or immunotherapy agents vidaza    To help prevent nausea and vomiting after your treatment, we encourage you to take your nausea medication as directed.  BELOW ARE SYMPTOMS THAT SHOULD BE REPORTED IMMEDIATELY: *FEVER GREATER THAN 100.4 F (38 C) OR HIGHER *CHILLS OR SWEATING *NAUSEA AND VOMITING THAT IS NOT CONTROLLED WITH YOUR NAUSEA MEDICATION *UNUSUAL SHORTNESS OF BREATH *UNUSUAL BRUISING OR BLEEDING *URINARY PROBLEMS (pain or burning when urinating, or frequent urination) *BOWEL PROBLEMS (unusual diarrhea, constipation, pain near the anus) TENDERNESS IN MOUTH AND THROAT WITH OR WITHOUT PRESENCE OF ULCERS (sore throat, sores in mouth, or a toothache) UNUSUAL RASH, SWELLING OR PAIN  UNUSUAL VAGINAL DISCHARGE OR ITCHING   Items with * indicate a potential emergency and should be followed up as soon as possible or go to the Emergency Department if any problems should occur.  Please show the CHEMOTHERAPY ALERT CARD or IMMUNOTHERAPY ALERT CARD at check-in to the  Emergency Department and triage nurse.  Should you have questions after your visit or need to cancel or reschedule your appointment, please contact [facility] CANCER [place] AT [place]  [phone] and follow the prompts.  Office hours are 8:00 a.m. to 4:30 p.m. Monday - Friday. Please note that voicemails left after 4:00 p.m. may not be returned until the following business day.  We are closed weekends and major holidays. You have access to a nurse at all times for urgent questions. Please call the main number to the clinic [phone] and follow the prompts.  For any non-urgent questions, you may also contact your provider using MyChart. We now offer e-Visits for anyone 44 and older to request care online for non-urgent symptoms. For details visit [email or web address].   Also download the MyChart app! Go to the app store, search "MyChart", open the app, select [place], and log in with your MyChart username and password.  Due to Covid, a mask is required upon entering the hospital/clinic. If you do not have a mask, one will be given to you upon arrival. For doctor visits, patients may have 1 support person aged 42 or older with them. For treatment visits, patients cannot have anyone with them due to current Covid guidelines and our immunocompromised population.

## 2021-06-05 ENCOUNTER — Other Ambulatory Visit: Payer: Self-pay

## 2021-06-05 ENCOUNTER — Inpatient Hospital Stay: Payer: PPO

## 2021-06-05 VITALS — BP 124/87 | HR 79 | Temp 96.0°F | Resp 19

## 2021-06-05 DIAGNOSIS — C9200 Acute myeloblastic leukemia, not having achieved remission: Secondary | ICD-10-CM

## 2021-06-05 DIAGNOSIS — M810 Age-related osteoporosis without current pathological fracture: Secondary | ICD-10-CM

## 2021-06-05 DIAGNOSIS — Z5111 Encounter for antineoplastic chemotherapy: Secondary | ICD-10-CM | POA: Diagnosis not present

## 2021-06-05 MED ORDER — ONDANSETRON HCL 4 MG PO TABS
8.0000 mg | ORAL_TABLET | Freq: Once | ORAL | Status: AC
  Administered 2021-06-05: 8 mg via ORAL
  Filled 2021-06-05: qty 2

## 2021-06-05 MED ORDER — AZACITIDINE CHEMO SQ INJECTION
50.0000 mg/m2 | Freq: Once | INTRAMUSCULAR | Status: AC
  Administered 2021-06-05: 87.5 mg via SUBCUTANEOUS
  Filled 2021-06-05: qty 3.5

## 2021-06-05 NOTE — Telephone Encounter (Signed)
See other note

## 2021-06-05 NOTE — Telephone Encounter (Signed)
Mammo and dexa scheduled. Pt aware of date and time. Pt declined f/u with GI at this time. Would like to hold off for now.

## 2021-06-05 NOTE — Patient Instructions (Signed)
[facility] CANCER CTR AT [place]  Discharge Instructions: Thank you for choosing [facility] to provide your oncology and hematology care.  If you have a lab appointment with the [facility], please go directly to the [facility] and check in at the registration area.  Wear comfortable clothing and clothing appropriate for easy access to any Portacath or PICC line.   We strive to give you quality time with your provider. You may need to reschedule your appointment if you arrive late (15 or more minutes).  Arriving late affects you and other patients whose appointments are after yours.  Also, if you miss three or more appointments without notifying the office, you may be dismissed from the clinic at the providers discretion.      For prescription refill requests, have your pharmacy contact our office and allow 72 hours for refills to be completed.    Today you received the following chemotherapy and/or immunotherapy agents vidaza    To help prevent nausea and vomiting after your treatment, we encourage you to take your nausea medication as directed.  BELOW ARE SYMPTOMS THAT SHOULD BE REPORTED IMMEDIATELY: *FEVER GREATER THAN 100.4 F (38 C) OR HIGHER *CHILLS OR SWEATING *NAUSEA AND VOMITING THAT IS NOT CONTROLLED WITH YOUR NAUSEA MEDICATION *UNUSUAL SHORTNESS OF BREATH *UNUSUAL BRUISING OR BLEEDING *URINARY PROBLEMS (pain or burning when urinating, or frequent urination) *BOWEL PROBLEMS (unusual diarrhea, constipation, pain near the anus) TENDERNESS IN MOUTH AND THROAT WITH OR WITHOUT PRESENCE OF ULCERS (sore throat, sores in mouth, or a toothache) UNUSUAL RASH, SWELLING OR PAIN  UNUSUAL VAGINAL DISCHARGE OR ITCHING   Items with * indicate a potential emergency and should be followed up as soon as possible or go to the Emergency Department if any problems should occur.  Please show the CHEMOTHERAPY ALERT CARD or IMMUNOTHERAPY ALERT CARD at check-in to the  Emergency Department and triage nurse.  Should you have questions after your visit or need to cancel or reschedule your appointment, please contact [facility] CANCER [place] AT [place]  [phone] and follow the prompts.  Office hours are 8:00 a.m. to 4:30 p.m. Monday - Friday. Please note that voicemails left after 4:00 p.m. may not be returned until the following business day.  We are closed weekends and major holidays. You have access to a nurse at all times for urgent questions. Please call the main number to the clinic [phone] and follow the prompts.  For any non-urgent questions, you may also contact your provider using MyChart. We now offer e-Visits for anyone 20 and older to request care online for non-urgent symptoms. For details visit [email or web address].   Also download the MyChart app! Go to the app store, search "MyChart", open the app, select [place], and log in with your MyChart username and password.  Due to Covid, a mask is required upon entering the hospital/clinic. If you do not have a mask, one will be given to you upon arrival. For doctor visits, patients may have 1 support person aged 20 or older with them. For treatment visits, patients cannot have anyone with them due to current Covid guidelines and our immunocompromised population.

## 2021-06-06 ENCOUNTER — Other Ambulatory Visit: Payer: Self-pay

## 2021-06-06 ENCOUNTER — Telehealth: Payer: Self-pay | Admitting: Internal Medicine

## 2021-06-06 ENCOUNTER — Inpatient Hospital Stay: Payer: PPO

## 2021-06-06 VITALS — BP 132/72 | HR 79 | Temp 97.6°F

## 2021-06-06 DIAGNOSIS — C9200 Acute myeloblastic leukemia, not having achieved remission: Secondary | ICD-10-CM

## 2021-06-06 DIAGNOSIS — Z5111 Encounter for antineoplastic chemotherapy: Secondary | ICD-10-CM | POA: Diagnosis not present

## 2021-06-06 MED ORDER — ONDANSETRON HCL 4 MG PO TABS
8.0000 mg | ORAL_TABLET | Freq: Once | ORAL | Status: AC
  Administered 2021-06-06: 8 mg via ORAL
  Filled 2021-06-06: qty 2

## 2021-06-06 MED ORDER — AZACITIDINE CHEMO SQ INJECTION
50.0000 mg/m2 | Freq: Once | INTRAMUSCULAR | Status: AC
  Administered 2021-06-06: 87.5 mg via SUBCUTANEOUS
  Filled 2021-06-06: qty 3.5

## 2021-06-06 NOTE — Patient Instructions (Signed)
[facility] CANCER CTR AT [place]  Discharge Instructions: Thank you for choosing [facility] to provide your oncology and hematology care.  If you have a lab appointment with the [facility], please go directly to the [facility] and check in at the registration area.  Wear comfortable clothing and clothing appropriate for easy access to any Portacath or PICC line.   We strive to give you quality time with your provider. You may need to reschedule your appointment if you arrive late (15 or more minutes).  Arriving late affects you and other patients whose appointments are after yours.  Also, if you miss three or more appointments without notifying the office, you may be dismissed from the clinic at the providers discretion.      For prescription refill requests, have your pharmacy contact our office and allow 72 hours for refills to be completed.    Today you received the following chemotherapy and/or immunotherapy agents: Vidaza      To help prevent nausea and vomiting after your treatment, we encourage you to take your nausea medication as directed.  BELOW ARE SYMPTOMS THAT SHOULD BE REPORTED IMMEDIATELY: *FEVER GREATER THAN 100.4 F (38 C) OR HIGHER *CHILLS OR SWEATING *NAUSEA AND VOMITING THAT IS NOT CONTROLLED WITH YOUR NAUSEA MEDICATION *UNUSUAL SHORTNESS OF BREATH *UNUSUAL BRUISING OR BLEEDING *URINARY PROBLEMS (pain or burning when urinating, or frequent urination) *BOWEL PROBLEMS (unusual diarrhea, constipation, pain near the anus) TENDERNESS IN MOUTH AND THROAT WITH OR WITHOUT PRESENCE OF ULCERS (sore throat, sores in mouth, or a toothache) UNUSUAL RASH, SWELLING OR PAIN  UNUSUAL VAGINAL DISCHARGE OR ITCHING   Items with * indicate a potential emergency and should be followed up as soon as possible or go to the Emergency Department if any problems should occur.  Please show the CHEMOTHERAPY ALERT CARD or IMMUNOTHERAPY ALERT CARD at check-in to the  Emergency Department and triage nurse.  Should you have questions after your visit or need to cancel or reschedule your appointment, please contact [facility] CANCER [place] AT [place]  [phone] and follow the prompts.  Office hours are 8:00 a.m. to 4:30 p.m. Monday - Friday. Please note that voicemails left after 4:00 p.m. may not be returned until the following business day.  We are closed weekends and major holidays. You have access to a nurse at all times for urgent questions. Please call the main number to the clinic [phone] and follow the prompts.  For any non-urgent questions, you may also contact your provider using MyChart. We now offer e-Visits for anyone 24 and older to request care online for non-urgent symptoms. For details visit [email or web address].   Also download the MyChart app! Go to the app store, search "MyChart", open the app, select [place], and log in with your MyChart username and password.  Due to Covid, a mask is required upon entering the hospital/clinic. If you do not have a mask, one will be given to you upon arrival. For doctor visits, patients may have 1 support person aged 9 or older with them. For treatment visits, patients cannot have anyone with them due to current Covid guidelines and our immunocompromised population. Azacitidine suspension for injection (subcutaneous use) What is this medication? AZACITIDINE (ay [place]) is a chemotherapy drug. This medicine reduces the growth of cancer cells and can suppress the immune system. It is used for treating myelodysplastic syndrome or some types of leukemia. This medicine may be used for other purposes; ask your health  care provider or pharmacist if you have questions. COMMON BRAND NAME(S): Vidaza What should I tell my care team before I take this medication? They need to know if you have any of these conditions: kidney disease liver disease liver tumors an unusual or allergic reaction  to azacitidine, mannitol, other medicines, foods, dyes, or preservatives pregnant or trying to get pregnant breast-feeding How should I use this medication? This medicine is for injection under the skin. It is administered in a hospital or clinic by a specially trained health care professional. Talk to your pediatrician regarding the use of this medicine in children. While this drug may be prescribed for selected conditions, precautions do apply. Overdosage: If you think you have taken too much of this medicine contact a poison control center or emergency room at once. NOTE: This medicine is only for you. Do not share this medicine with others. What if I miss a dose? It is important not to miss your dose. Call your doctor or health care professional if you are unable to keep an appointment. What may interact with this medication? Interactions have not been studied. Give your health care provider a list of all the medicines, herbs, non-prescription drugs, or dietary supplements you use. Also tell them if you smoke, drink alcohol, or use illegal drugs. Some items may interact with your medicine. This list may not describe all possible interactions. Give your health care provider a list of all the medicines, herbs, non-prescription drugs, or dietary supplements you use. Also tell them if you smoke, drink alcohol, or use illegal drugs. Some items may interact with your medicine. What should I watch for while using this medication? Visit your doctor for checks on your progress. This drug may make you feel generally unwell. This is not uncommon, as chemotherapy can affect healthy cells as well as cancer cells. Report any side effects. Continue your course of treatment even though you feel ill unless your doctor tells you to stop. In some cases, you may be given additional medicines to help with side effects. Follow all directions for their use. Call your doctor or health care professional for advice if  you get a fever, chills or sore throat, or other symptoms of a cold or flu. Do not treat yourself. This drug decreases your body's ability to fight infections. Try to avoid being around people who are sick. This medicine may increase your risk to bruise or bleed. Call your doctor or health care professional if you notice any unusual bleeding. You may need blood work done while you are taking this medicine. Do not become pregnant while taking this medicine and for 6 months after the last dose. Women should inform their doctor if they wish to become pregnant or think they might be pregnant. Men should not father a child while taking this medicine and for 3 months after the last dose. There is a potential for serious side effects to an unborn child. Talk to your health care professional or pharmacist for more information. Do not breast-feed an infant while taking this medicine and for 1 week after the last dose. This medicine may interfere with the ability to have a child. Talk with your doctor or health care professional if you are concerned about your fertility. What side effects may I notice from receiving this medication? Side effects that you should report to your doctor or health care professional as soon as possible: allergic reactions like skin rash, itching or hives, swelling of the face, lips, or tongue  low blood counts - this medicine may decrease the number of white blood cells, red blood cells and platelets. You may be at increased risk for infections and bleeding. signs of infection - fever or chills, cough, sore throat, pain passing urine signs of decreased platelets or bleeding - bruising, pinpoint red spots on the skin, black, tarry stools, blood in the urine signs of decreased red blood cells - unusually weak or tired, fainting spells, lightheadedness signs and symptoms of kidney injury like trouble passing urine or change in the amount of urine signs and symptoms of liver injury like  dark yellow or brown urine; general ill feeling or flu-like symptoms; light-colored stools; loss of appetite; nausea; right upper belly pain; unusually weak or tired; yellowing of the eyes or skin Side effects that usually do not require medical attention (report to your doctor or health care professional if they continue or are bothersome): constipation diarrhea nausea, vomiting pain or redness at the injection site unusually weak or tired This list may not describe all possible side effects. Call your doctor for medical advice about side effects. You may report side effects to FDA at 1-800-FDA-1088. Where should I keep my medication? This drug is given in a hospital or clinic and will not be stored at home. NOTE: This sheet is a summary. It may not cover all possible information. If you have questions about this medicine, talk to your doctor, pharmacist, or health care provider.  2022 [employer]vier/Gold Standard (2016-05-20 00:00:00)

## 2021-06-06 NOTE — Telephone Encounter (Signed)
Copied from [place] [id number]. Topic: Medicare AWV >> Jun 06, 2021  1:58 PM Harris-Coley, Hannah Beat wrote: Reason for CRM: Left message for patient to schedule Annual Wellness Visit.  Please schedule with Nurse Health Advisor Denisa O'Brien-Blaney, LPN at [facility].  Please call [phone] ask for [facility]

## 2021-06-09 ENCOUNTER — Other Ambulatory Visit: Payer: Self-pay | Admitting: *Deleted

## 2021-06-09 MED ORDER — VALACYCLOVIR HCL 500 MG PO TABS: 500.0000 mg | ORAL_TABLET | Freq: Every day | ORAL | 3 refills | Status: DC

## 2021-06-27 NOTE — Progress Notes (Signed)
[place]  Telephone:([phone]) [phone] Fax:(336) [phone]  ID: VARIE MACHAMER OB: 1950/05/20  MR#: [id number]  HWK#:[id number]  Patient Care Team: [name], MD as PCP - General (Internal Medicine) Wellington Hampshire, MD as PCP - Cardiology (Cardiology)  CHIEF COMPLAINT: AML.  INTERVAL HISTORY: Patient returns to clinic today for further evaluation and continuation of maintenance Vidaza.  She admitted to nursing that she has increased depressive symptoms, but otherwise feels well.  She continues to tolerate her treatments without significant side effects.  She has chronic weakness and fatigue.  She denies any pain.  She has no neurologic complaints.  She denies any recent fevers or illnesses. She has no chest pain, shortness of breath, cough, or hemoptysis.  She denies any nausea, vomiting, constipation, or diarrhea.  She has no urinary complaints.  Patient offers no further specific complaints today.  REVIEW OF SYSTEMS:   Review of Systems  Constitutional:  Positive for malaise/fatigue. Negative for fever and weight loss.  Respiratory: Negative.  Negative for cough, hemoptysis and shortness of breath.   Cardiovascular: Negative.  Negative for chest pain and leg swelling.  Gastrointestinal:  Negative for abdominal pain and diarrhea.  Genitourinary: Negative.  Negative for dysuria.  Musculoskeletal: Negative.  Negative for back pain and falls.  Skin: Negative.  Negative for rash.  Neurological:  Positive for weakness. Negative for dizziness, focal weakness and headaches.  Psychiatric/Behavioral: Negative.  Negative for memory loss. The patient is not nervous/anxious.    As per HPI. Otherwise, a complete review of systems is negative.  PAST MEDICAL HISTORY: Past Medical History:  Diagnosis Date   Anxiety    Arthritis    Osteoarthritis   Breast cancer ([place]) 2009   right breast lumpectomy with rad tx   Colon cancer ([place])    surgery with chemo and rad tx    Complication of anesthesia    GERD (gastroesophageal reflux disease)    Heart palpitations    History of hiatal hernia    History of kidney stones    Hypothyroidism    Liver disease    Liver nodule    s/p negative biopsy   Malignant neoplasm of thyroid gland ([place]) 2002   s/p surgery and XRT   Osteoporosis    Other and unspecified hyperlipidemia    Palpitations    Personal history of chemotherapy    Personal history of malignant neoplasm of large intestine    carcinoma - cecum, s/p right laparoscopic colectomy - s/p chemotherapy and XRT   Personal history of radiation therapy    Pneumonia 2019   PONV (postoperative nausea and vomiting)    Pure hypercholesterolemia    Unspecified hereditary and idiopathic peripheral neuropathy     PAST SURGICAL HISTORY: Past Surgical History:  Procedure Laterality Date   APPENDECTOMY  1985   BREAST BIOPSY Right 2009   positive   BREAST BIOPSY Right 2009   negative   BREAST LUMPECTOMY Right 2009   breast cancer   CHOLECYSTECTOMY  1995   COLONOSCOPY     COLONOSCOPY WITH PROPOFOL N/A 10/29/2017   Procedure: COLONOSCOPY WITH PROPOFOL;  Surgeon: Manya Silvas, MD;  Location: [facility] ENDOSCOPY;  Service: Endoscopy;  Laterality: N/A;   DILATION AND CURETTAGE OF UTERUS  1990   DILATION AND CURETTAGE, DIAGNOSTIC / THERAPEUTIC  1990   ESOPHAGOGASTRODUODENOSCOPY     ESOPHAGOGASTRODUODENOSCOPY (EGD) WITH PROPOFOL N/A 10/29/2017   Procedure: ESOPHAGOGASTRODUODENOSCOPY (EGD) WITH PROPOFOL;  Surgeon: Manya Silvas, MD;  Location: [facility] ENDOSCOPY;  Service:  Endoscopy;  Laterality: N/A;   INCISION AND DRAINAGE PERIRECTAL ABSCESS N/A 06/17/2020   Procedure: IRRIGATION AND DEBRIDEMENT PERIRECTAL ABSCESS;  Surgeon: Jules Husbands, MD;  Location: [facility] ORS;  Service: General;  Laterality: N/A;   LAPAROSCOPIC PARTIAL COLECTOMY     stage 3-C carcinoma of the cecum, s/p chemotherapy and xrt   LITHOTRIPSY     SIGMOIDOSCOPY  08/26/1993   THYROID LOBECTOMY  2002    s/p XRT   TOTAL HIP ARTHROPLASTY Right 10/24/2019   Procedure: TOTAL HIP ARTHROPLASTY;  Surgeon: Corky Mull, MD;  Location: [facility] ORS;  Service: Orthopedics;  Laterality: Right;    FAMILY HISTORY: Family History  Problem Relation Age of Onset   Stroke Mother        24s   Alzheimer's disease Mother    Cancer Mother    Lung cancer Father    Prostate cancer Father    Cancer Father        Colon   Colon cancer Father    Breast cancer Sister        41's   Lung cancer Sister    Breast cancer Maternal Aunt     ADVANCED DIRECTIVES (Y/N):  N  HEALTH MAINTENANCE: Social History   Tobacco Use   Smoking status: Never   Smokeless tobacco: Never  Vaping Use   Vaping Use: Never used  Substance Use Topics   Alcohol use: No    Alcohol/week: 0.0 standard drinks   Drug use: No     Colonoscopy:  PAP:  Bone density:  Lipid panel:  Allergies  Allergen Reactions   Demeclocycline Other (See Comments)    Throat swells   Sulfa Antibiotics Other (See Comments)    Other reaction(s): Other (See Comments) Throat swells Other reaction(s): Other (See Comments) Throat swells   Tetracyclines & Related Other (See Comments)    Throat swells    Augmentin [Amoxicillin-Pot Clavulanate] Diarrhea   Bentyl [Dicyclomine Hcl]     unkn   Ciprofloxacin Diarrhea   Codeine Other (See Comments)    dizziness     Dicyclomine Hcl Other (See Comments)    unkn   Epinephrine     Speeds heart up - panic    Flagyl [Metronidazole] Nausea And Vomiting   Librax [Chlordiazepoxide-Clidinium]     unkn   Novocain [Procaine] Other (See Comments)    "Shaky"   Phenobarbital     unkn   Prednisone     Nervous     Ultram [Tramadol] Other (See Comments)    Sick feeling    Current Outpatient Medications  Medication Sig Dispense Refill   Azelastine-Fluticasone 137-50 MCG/ACT SUSP SPRAY 2 SPRAYS INTO EACH NOSTRIL EVERY DAY 23 g 3   clonazePAM (KLONOPIN) 0.5 MG tablet Take 1.5 mg by mouth 2 (two) times  daily.   0   diphenoxylate-atropine (LOMOTIL) 2.5-0.025 MG tablet Take 1 tablet by mouth 4 (four) times daily as needed for diarrhea or loose stools. 120 tablet 1   escitalopram (LEXAPRO) 20 MG tablet Take 20 mg by mouth every morning.      levothyroxine (SYNTHROID) 125 MCG tablet TAKE 1 TABLET EVERY DAY ON EMPTY STOMACHWITH A GLASS OF WATER AT LEAST 30-60 MINBEFORE BREAKFAST 90 tablet 1   loperamide (IMODIUM) 2 MG capsule Take by mouth.     magnesium oxide (MAG-OX) 400 MG tablet Take 1 tablet (400 mg total) by mouth daily. 30 tablet 0   metoprolol tartrate (LOPRESSOR) 25 MG tablet TAKE 1 TABLET (25 MG TOTAL) BY  MOUTH AS NEEDED (AS NEEDED UP TO TWICE DAILY FOR PALPITATIONS). 60 tablet 1   oxyCODONE-acetaminophen (PERCOCET/ROXICET) 5-325 MG tablet Take 1 tablet by mouth every 6 (six) hours as needed for severe pain. 16 tablet 0   pantoprazole (PROTONIX) 40 MG tablet TAKE ONE TABLET (40 MG) BY MOUTH EVERY DAY 90 tablet 2   potassium chloride SA (KLOR-CON) 20 MEQ tablet TAKE ONE (1) TABLET BY MOUTH TWO TIMES PER DAY 60 tablet 0   prochlorperazine (COMPAZINE) 10 MG tablet Take 1 tablet (10 mg total) by mouth every 6 (six) hours as needed for refractory nausea / vomiting. 30 tablet 0   valACYclovir (VALTREX) 500 MG tablet Take 1 tablet (500 mg total) by mouth daily. 90 tablet 3   Current Facility-Administered Medications  Medication Dose Route Frequency Provider Last Rate Last Admin   ondansetron (ZOFRAN) tablet 8 mg  8 mg Oral Once Guse, Jacquelynn Cree, FNP       Facility-Administered Medications Ordered in Other Visits  Medication Dose Route Frequency Provider Last Rate Last Admin   azaCITIDine (VIDAZA) chemo injection 87.5 mg  50 mg/m2 (Treatment Plan Recorded) Subcutaneous Once Lloyd Huger, MD       potassium chloride (KLOR-CON) CR tablet 40 mEq  40 mEq Oral BID Lloyd Huger, MD   40 mEq at 07/08/20 1136    OBJECTIVE: Vitals:   07/01/21 1329  BP: 128/76  Pulse: 73  Temp: 98.6 F  (37 C)     Body mass index is 22.19 kg/m.    ECOG FS:0 - Asymptomatic  General: Well-developed, well-nourished, no acute distress. Eyes: Pink conjunctiva, anicteric sclera. HEENT: Normocephalic, moist mucous membranes. Lungs: No audible wheezing or coughing. Heart: Regular rate and rhythm. Abdomen: Soft, nontender, no obvious distention. Musculoskeletal: No edema, cyanosis, or clubbing. Neuro: Alert, answering all questions appropriately. Cranial nerves grossly intact. Skin: No rashes or petechiae noted. Psych: Normal affect.   LAB RESULTS:  Lab Results  Component Value Date   NA 141 06/30/2021   K 4.0 06/30/2021   CL 104 06/30/2021   CO2 28 06/30/2021   GLUCOSE 104 (H) 06/30/2021   BUN 15 06/30/2021   CREATININE 0.83 06/30/2021   CALCIUM 8.8 (L) 06/30/2021   PROT 6.6 06/30/2021   ALBUMIN 3.4 (L) 06/30/2021   AST 14 (L) 06/30/2021   ALT 20 06/30/2021   ALKPHOS 110 06/30/2021   BILITOT 0.4 06/30/2021   GFRNONAA >60 06/30/2021   GFRAA 54 (L) 10/26/2019    Lab Results  Component Value Date   WBC 1.5 (L) 06/30/2021   NEUTROABS 0.7 (L) 06/30/2021   HGB 13.2 06/30/2021   HCT 39.6 06/30/2021   MCV 83.4 06/30/2021   PLT 149 (L) 06/30/2021     STUDIES: No results found.   ASSESSMENT: AML.  PLAN:    1.  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  Her peripheral blood flow cytometry also revealed 6% aberrant myeloblasts.  Patient completed cycle 1 of induction therapy at [facility] with Vidaza and venetoclax.  She only received 5 days of Vidaza for cycle 2 recently which was interrupted secondary to significant diarrhea and a perirectal abscess.  Repeat bone marrow biopsy on January 02, 2021 did not reveal any overt disease, but cytogenetics remained positive likely indicating a very good partial remission.  Patient has been instructed to discontinue venetoclax.  Will continue maintenance treatment with Vidaza on days 1 through 5 every 28 days.  Proceed with cycle 13 of treatment  today.  Return to clinic  daily for the remainder of the week.  Patient will then return in 3 weeks with repeat bone marrow biopsy and in 4 weeks for further evaluation and consideration of cycle 14.   2.  Neutropenia: Chronic and unchanged.  Bone marrow biopsy prior to next cycle as above. 3.  Thrombocytopenia: Resolved.  Discontinue venetoclax as above.   4.  Perirectal abscess: Resolved.   5.  Diarrhea: Patient does not complain of this today.  Continue Lomotil as needed. 6.  Supportive care: Patient previously was given referrals to home health and home palliative care.  Appreciate palliative care input.    7.  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  Patient also received chemotherapy and likely received Adriamycin, but exact regimen is unknown.  She completed 5 years of hormonal therapy in approximately June 2015. Currently, she has no evidence of disease.  Her most recent mammogram on March 05, 2020 was reported as BI-RADS 1.  Patient has repeat mammogram scheduled for July 29, 2021.  Repeat in November 2022. 8.  History of colon cancer: Patient also states that she received chemotherapy for this, possibly FOLFOX but again this is unknown.   Patient expressed understanding and was in agreement with this plan. She also understands that She can call clinic at any time with any questions, concerns, or complaints.    Cancer Staging  History of breast cancer Staging form: Breast, AJCC 7th Edition - Clinical stage from 01/07/2016: Stage IA (T1c, N0, M0) - Signed by Lloyd Huger, MD on 01/07/2016 Laterality: Right Estrogen receptor status: Positive Progesterone receptor status: Positive HER2 status: Negative   Lloyd Huger, MD   07/01/2021 2:30 PM

## 2021-06-30 ENCOUNTER — Other Ambulatory Visit: Payer: Self-pay

## 2021-06-30 ENCOUNTER — Inpatient Hospital Stay: Payer: PPO

## 2021-06-30 VITALS — BP 143/75 | HR 81 | Temp 97.6°F | Resp 18

## 2021-06-30 DIAGNOSIS — C9200 Acute myeloblastic leukemia, not having achieved remission: Secondary | ICD-10-CM

## 2021-06-30 DIAGNOSIS — Z5111 Encounter for antineoplastic chemotherapy: Secondary | ICD-10-CM | POA: Diagnosis not present

## 2021-06-30 LAB — COMPREHENSIVE METABOLIC PANEL
ALT: 20 U/L (ref 0–44)
AST: 14 U/L — ABNORMAL LOW (ref 15–41)
Albumin: 3.4 g/dL — ABNORMAL LOW (ref 3.5–5.0)
Alkaline Phosphatase: 110 U/L (ref 38–126)
Anion gap: 9 (ref 5–15)
BUN: 15 mg/dL (ref 8–23)
CO2: 28 mmol/L (ref 22–32)
Calcium: 8.8 mg/dL — ABNORMAL LOW (ref 8.9–10.3)
Chloride: 104 mmol/L (ref 98–111)
Creatinine, Ser: 0.83 mg/dL (ref 0.44–1.00)
GFR, Estimated: >60 mL/min (ref >60)
Glucose, Bld: 104 mg/dL — ABNORMAL HIGH (ref 70–99)
Potassium: 4 mmol/L (ref 3.5–5.1)
Sodium: 141 mmol/L (ref 135–145)
Total Bilirubin: 0.4 mg/dL (ref 0.3–1.2)
Total Protein: 6.6 g/dL (ref 6.5–8.1)

## 2021-06-30 LAB — CBC WITH DIFFERENTIAL/PLATELET
Abs Immature Granulocytes: 0.01 10*3/uL (ref 0.00–0.07)
Basophils Absolute: 0 10*3/uL (ref 0.0–0.1)
Basophils Relative: 0 %
Eosinophils Absolute: 0 10*3/uL (ref 0.0–0.5)
Eosinophils Relative: 1 %
HCT: 39.6 % (ref 36.0–46.0)
Hemoglobin: 13.2 g/dL (ref 12.0–15.0)
Immature Granulocytes: 1 %
Lymphocytes Relative: 43 %
Lymphs Abs: 0.6 10*3/uL — ABNORMAL LOW (ref 0.7–4.0)
MCH: 27.8 pg (ref 26.0–34.0)
MCHC: 33.3 g/dL (ref 30.0–36.0)
MCV: 83.4 fL (ref 80.0–100.0)
Monocytes Absolute: 0.1 10*3/uL (ref 0.1–1.0)
Monocytes Relative: 6 %
Neutro Abs: 0.7 10*3/uL — ABNORMAL LOW (ref 1.7–7.7)
Neutrophils Relative %: 49 %
Platelets: 149 10*3/uL — ABNORMAL LOW (ref 150–400)
RBC: 4.75 MIL/uL (ref 3.87–5.11)
RDW: 15.4 % (ref 11.5–15.5)
WBC: 1.5 10*3/uL — ABNORMAL LOW (ref 4.0–10.5)
nRBC: 0 % (ref 0.0–0.2)

## 2021-06-30 MED ORDER — AZACITIDINE CHEMO SQ INJECTION
50.0000 mg/m2 | Freq: Once | INTRAMUSCULAR | Status: AC
  Administered 2021-06-30: 87.5 mg via SUBCUTANEOUS
  Filled 2021-06-30: qty 3.5

## 2021-06-30 MED ORDER — ONDANSETRON HCL 4 MG PO TABS
8.0000 mg | ORAL_TABLET | Freq: Once | ORAL | Status: AC
  Administered 2021-06-30: 8 mg via ORAL
  Filled 2021-06-30: qty 2

## 2021-06-30 NOTE — Patient Instructions (Signed)
[facility] CANCER CTR AT [place]  Discharge Instructions: Thank you for choosing [facility] to provide your oncology and hematology care.  If you have a lab appointment with the [facility], please go directly to the [facility] and check in at the registration area.  Wear comfortable clothing and clothing appropriate for easy access to any Portacath or PICC line.   We strive to give you quality time with your provider. You may need to reschedule your appointment if you arrive late (15 or more minutes).  Arriving late affects you and other patients whose appointments are after yours.  Also, if you miss three or more appointments without notifying the office, you may be dismissed from the clinic at the providers discretion.      For prescription refill requests, have your pharmacy contact our office and allow 72 hours for refills to be completed.    Today you received the following chemotherapy and/or immunotherapy agents Vidaza      To help prevent nausea and vomiting after your treatment, we encourage you to take your nausea medication as directed.  BELOW ARE SYMPTOMS THAT SHOULD BE REPORTED IMMEDIATELY: *FEVER GREATER THAN 100.4 F (38 C) OR HIGHER *CHILLS OR SWEATING *NAUSEA AND VOMITING THAT IS NOT CONTROLLED WITH YOUR NAUSEA MEDICATION *UNUSUAL SHORTNESS OF BREATH *UNUSUAL BRUISING OR BLEEDING *URINARY PROBLEMS (pain or burning when urinating, or frequent urination) *BOWEL PROBLEMS (unusual diarrhea, constipation, pain near the anus) TENDERNESS IN MOUTH AND THROAT WITH OR WITHOUT PRESENCE OF ULCERS (sore throat, sores in mouth, or a toothache) UNUSUAL RASH, SWELLING OR PAIN  UNUSUAL VAGINAL DISCHARGE OR ITCHING   Items with * indicate a potential emergency and should be followed up as soon as possible or go to the Emergency Department if any problems should occur.  Please show the CHEMOTHERAPY ALERT CARD or IMMUNOTHERAPY ALERT CARD at check-in to the  Emergency Department and triage nurse.  Should you have questions after your visit or need to cancel or reschedule your appointment, please contact [facility] CANCER [place] AT [place]  [phone] and follow the prompts.  Office hours are 8:00 a.m. to 4:30 p.m. Monday - Friday. Please note that voicemails left after 4:00 p.m. may not be returned until the following business day.  We are closed weekends and major holidays. You have access to a nurse at all times for urgent questions. Please call the main number to the clinic [phone] and follow the prompts.  For any non-urgent questions, you may also contact your provider using MyChart. We now offer e-Visits for anyone 21 and older to request care online for non-urgent symptoms. For details visit [email or web address].   Also download the MyChart app! Go to the app store, search "MyChart", open the app, select [place], and log in with your MyChart username and password.  Due to Covid, a mask is required upon entering the hospital/clinic. If you do not have a mask, one will be given to you upon arrival. For doctor visits, patients may have 1 support person aged 36 or older with them. For treatment visits, patients cannot have anyone with them due to current Covid guidelines and our immunocompromised population.

## 2021-07-01 ENCOUNTER — Inpatient Hospital Stay (HOSPITAL_BASED_OUTPATIENT_CLINIC_OR_DEPARTMENT_OTHER): Payer: PPO | Admitting: Oncology

## 2021-07-01 ENCOUNTER — Encounter: Payer: Self-pay | Admitting: Oncology

## 2021-07-01 ENCOUNTER — Other Ambulatory Visit: Payer: PPO

## 2021-07-01 ENCOUNTER — Inpatient Hospital Stay: Payer: PPO

## 2021-07-01 VITALS — BP 128/76 | HR 73 | Temp 98.6°F | Wt 129.3 lb

## 2021-07-01 DIAGNOSIS — F3289 Other specified depressive episodes: Secondary | ICD-10-CM | POA: Diagnosis not present

## 2021-07-01 DIAGNOSIS — C9200 Acute myeloblastic leukemia, not having achieved remission: Secondary | ICD-10-CM

## 2021-07-01 DIAGNOSIS — Z5111 Encounter for antineoplastic chemotherapy: Secondary | ICD-10-CM | POA: Diagnosis not present

## 2021-07-01 MED ORDER — AZACITIDINE CHEMO SQ INJECTION
50.0000 mg/m2 | Freq: Once | INTRAMUSCULAR | Status: AC
  Administered 2021-07-01: 87.5 mg via SUBCUTANEOUS
  Filled 2021-07-01: qty 3.5

## 2021-07-01 MED ORDER — ONDANSETRON HCL 4 MG PO TABS
8.0000 mg | ORAL_TABLET | Freq: Once | ORAL | Status: AC
  Administered 2021-07-01: 8 mg via ORAL
  Filled 2021-07-01: qty 2

## 2021-07-01 NOTE — Progress Notes (Signed)
Pt is having a lot of depression and would like to speak with someone. She wants to know if there is any council groups that she can go to.

## 2021-07-02 ENCOUNTER — Other Ambulatory Visit: Payer: Self-pay

## 2021-07-02 ENCOUNTER — Other Ambulatory Visit: Payer: Self-pay | Admitting: Emergency Medicine

## 2021-07-02 ENCOUNTER — Telehealth: Payer: Self-pay | Admitting: Emergency Medicine

## 2021-07-02 ENCOUNTER — Inpatient Hospital Stay: Payer: PPO | Attending: Oncology

## 2021-07-02 VITALS — BP 133/76 | HR 76 | Temp 97.2°F

## 2021-07-02 DIAGNOSIS — D709 Neutropenia, unspecified: Secondary | ICD-10-CM | POA: Diagnosis not present

## 2021-07-02 DIAGNOSIS — Z5111 Encounter for antineoplastic chemotherapy: Secondary | ICD-10-CM | POA: Insufficient documentation

## 2021-07-02 DIAGNOSIS — M1990 Unspecified osteoarthritis, unspecified site: Secondary | ICD-10-CM | POA: Diagnosis not present

## 2021-07-02 DIAGNOSIS — G609 Hereditary and idiopathic neuropathy, unspecified: Secondary | ICD-10-CM | POA: Diagnosis not present

## 2021-07-02 DIAGNOSIS — Z85850 Personal history of malignant neoplasm of thyroid: Secondary | ICD-10-CM | POA: Diagnosis not present

## 2021-07-02 DIAGNOSIS — E039 Hypothyroidism, unspecified: Secondary | ICD-10-CM | POA: Diagnosis not present

## 2021-07-02 DIAGNOSIS — Z923 Personal history of irradiation: Secondary | ICD-10-CM | POA: Diagnosis not present

## 2021-07-02 DIAGNOSIS — C9200 Acute myeloblastic leukemia, not having achieved remission: Secondary | ICD-10-CM | POA: Insufficient documentation

## 2021-07-02 DIAGNOSIS — K219 Gastro-esophageal reflux disease without esophagitis: Secondary | ICD-10-CM | POA: Insufficient documentation

## 2021-07-02 DIAGNOSIS — F419 Anxiety disorder, unspecified: Secondary | ICD-10-CM | POA: Insufficient documentation

## 2021-07-02 DIAGNOSIS — Z853 Personal history of malignant neoplasm of breast: Secondary | ICD-10-CM | POA: Insufficient documentation

## 2021-07-02 DIAGNOSIS — Z85038 Personal history of other malignant neoplasm of large intestine: Secondary | ICD-10-CM | POA: Insufficient documentation

## 2021-07-02 DIAGNOSIS — M810 Age-related osteoporosis without current pathological fracture: Secondary | ICD-10-CM | POA: Insufficient documentation

## 2021-07-02 DIAGNOSIS — Z79899 Other long term (current) drug therapy: Secondary | ICD-10-CM | POA: Insufficient documentation

## 2021-07-02 MED ORDER — AZACITIDINE CHEMO SQ INJECTION
50.0000 mg/m2 | Freq: Once | INTRAMUSCULAR | Status: AC
  Administered 2021-07-02: 87.5 mg via SUBCUTANEOUS
  Filled 2021-07-02: qty 3.5

## 2021-07-02 MED ORDER — ONDANSETRON HCL 4 MG PO TABS
8.0000 mg | ORAL_TABLET | Freq: Once | ORAL | Status: AC
  Administered 2021-07-02: 8 mg via ORAL
  Filled 2021-07-02: qty 2

## 2021-07-02 NOTE — Telephone Encounter (Signed)
Pt informed of CT bone marrow biopsy scheduling information. Confirmed availability for appt on 07/09/21 arriving at 0830 for 930 appt time. Schedulers informed of confirmation. ?

## 2021-07-03 ENCOUNTER — Inpatient Hospital Stay: Payer: PPO

## 2021-07-03 VITALS — BP 130/65 | HR 72 | Temp 97.9°F | Resp 18

## 2021-07-03 DIAGNOSIS — C9200 Acute myeloblastic leukemia, not having achieved remission: Secondary | ICD-10-CM

## 2021-07-03 DIAGNOSIS — Z5111 Encounter for antineoplastic chemotherapy: Secondary | ICD-10-CM | POA: Diagnosis not present

## 2021-07-03 MED ORDER — AZACITIDINE CHEMO SQ INJECTION
50.0000 mg/m2 | Freq: Once | INTRAMUSCULAR | Status: AC
  Administered 2021-07-03: 87.5 mg via SUBCUTANEOUS
  Filled 2021-07-03: qty 3.5

## 2021-07-03 MED ORDER — ONDANSETRON HCL 4 MG PO TABS
8.0000 mg | ORAL_TABLET | Freq: Once | ORAL | Status: AC
  Administered 2021-07-03: 8 mg via ORAL
  Filled 2021-07-03: qty 2

## 2021-07-03 NOTE — Patient Instructions (Signed)
[facility] AT [place]  Discharge Instructions: °Thank you for choosing [facility] to provide your oncology and hematology care.  ° °If you have a lab appointment with the Cancer Center, please go directly to the Cancer Center and check in at the registration area. °  °Wear comfortable clothing and clothing appropriate for easy access to any Portacath or PICC line.  ° °We strive to give you quality time with your provider. You may need to reschedule your appointment if you arrive late (15 or more minutes).  Arriving late affects you and other patients whose appointments are after yours.  Also, if you miss three or more appointments without notifying the office, you may be dismissed from the clinic at the provider’s discretion.    °  °For prescription refill requests, have your pharmacy contact our office and allow 72 hours for refills to be completed.   ° °Today you received the following chemotherapy and/or immunotherapy agents     °  °To help prevent nausea and vomiting after your treatment, we encourage you to take your nausea medication as directed. ° °BELOW ARE SYMPTOMS THAT SHOULD BE REPORTED IMMEDIATELY: °*FEVER GREATER THAN 100.4 F (38 °C) OR HIGHER °*CHILLS OR SWEATING °*NAUSEA AND VOMITING THAT IS NOT CONTROLLED WITH YOUR NAUSEA MEDICATION °*UNUSUAL SHORTNESS OF BREATH °*UNUSUAL BRUISING OR BLEEDING °*URINARY PROBLEMS (pain or burning when urinating, or frequent urination) °*BOWEL PROBLEMS (unusual diarrhea, constipation, pain near the anus) °TENDERNESS IN MOUTH AND THROAT WITH OR WITHOUT PRESENCE OF ULCERS (sore throat, sores in mouth, or a toothache) °UNUSUAL RASH, SWELLING OR PAIN  °UNUSUAL VAGINAL DISCHARGE OR ITCHING  ° °Items with * indicate a potential emergency and should be followed up as soon as possible or go to the Emergency Department if any problems should occur. ° °Please show the CHEMOTHERAPY ALERT CARD or IMMUNOTHERAPY ALERT CARD at check-in to the  Emergency Department and triage nurse. ° °Should you have questions after your visit or need to cancel or reschedule your appointment, please contact [facility] [place]  Dept: [phone]  and follow the prompts.  Office hours are 8:00 a.m. to 4:30 p.m. Monday - Friday. Please note that voicemails left after 4:00 p.m. may not be returned until the following business day.  We are closed weekends and major holidays. You have access to a nurse at all times for urgent questions. Please call the main number to the clinic Dept: [phone] and follow the prompts. ° ° °For any non-urgent questions, you may also contact your provider using MyChart. We now offer e-Visits for anyone 18 and older to request care online for non-urgent symptoms. For details visit [email or web address]. °  °Also download the MyChart app! Go to the app store, search "MyChart", open the app, select [facility], and log in with your MyChart username and password. ° °Due to Covid, a mask is required upon entering the hospital/clinic. If you do not have a mask, one will be given to you upon arrival. For doctor visits, patients may have 1 support person aged 18 or older with them. For treatment visits, patients cannot have anyone with them due to current Covid guidelines and our immunocompromised population.  ° °

## 2021-07-04 ENCOUNTER — Inpatient Hospital Stay: Payer: PPO

## 2021-07-04 ENCOUNTER — Other Ambulatory Visit: Payer: Self-pay

## 2021-07-04 VITALS — BP 148/74 | HR 70 | Temp 97.9°F | Resp 18

## 2021-07-04 DIAGNOSIS — Z5111 Encounter for antineoplastic chemotherapy: Secondary | ICD-10-CM | POA: Diagnosis not present

## 2021-07-04 DIAGNOSIS — C9200 Acute myeloblastic leukemia, not having achieved remission: Secondary | ICD-10-CM

## 2021-07-04 MED ORDER — ONDANSETRON HCL 4 MG PO TABS
8.0000 mg | ORAL_TABLET | Freq: Once | ORAL | Status: AC
  Administered 2021-07-04: 8 mg via ORAL
  Filled 2021-07-04: qty 2

## 2021-07-04 MED ORDER — AZACITIDINE CHEMO SQ INJECTION
50.0000 mg/m2 | Freq: Once | INTRAMUSCULAR | Status: AC
  Administered 2021-07-04: 87.5 mg via SUBCUTANEOUS
  Filled 2021-07-04: qty 3.5

## 2021-07-04 NOTE — Patient Instructions (Signed)
[facility] CANCER CTR AT [place]  Discharge Instructions: Thank you for choosing [facility] to provide your oncology and hematology care.  If you have a lab appointment with the [place], please go directly to the [facility] and check in at the registration area.  Wear comfortable clothing and clothing appropriate for easy access to any Portacath or PICC line.   We strive to give you quality time with your provider. You may need to reschedule your appointment if you arrive late (15 or more minutes).  Arriving late affects you and other patients whose appointments are after yours.  Also, if you miss three or more appointments without notifying the office, you may be dismissed from the clinic at the providers discretion.      For prescription refill requests, have your pharmacy contact our office and allow 72 hours for refills to be completed.    Today you received the following chemotherapy and/or immunotherapy agents VIDAZA      To help prevent nausea and vomiting after your treatment, we encourage you to take your nausea medication as directed.  BELOW ARE SYMPTOMS THAT SHOULD BE REPORTED IMMEDIATELY: *FEVER GREATER THAN 100.4 F (38 C) OR HIGHER *CHILLS OR SWEATING *NAUSEA AND VOMITING THAT IS NOT CONTROLLED WITH YOUR NAUSEA MEDICATION *UNUSUAL SHORTNESS OF BREATH *UNUSUAL BRUISING OR BLEEDING *URINARY PROBLEMS (pain or burning when urinating, or frequent urination) *BOWEL PROBLEMS (unusual diarrhea, constipation, pain near the anus) TENDERNESS IN MOUTH AND THROAT WITH OR WITHOUT PRESENCE OF ULCERS (sore throat, sores in mouth, or a toothache) UNUSUAL RASH, SWELLING OR PAIN  UNUSUAL VAGINAL DISCHARGE OR ITCHING   Items with * indicate a potential emergency and should be followed up as soon as possible or go to the Emergency Department if any problems should occur.  Please show the CHEMOTHERAPY ALERT CARD or IMMUNOTHERAPY ALERT CARD at check-in to the  Emergency Department and triage nurse.  Should you have questions after your visit or need to cancel or reschedule your appointment, please contact [facility] CANCER [place] AT [place]  [phone] and follow the prompts.  Office hours are 8:00 a.m. to 4:30 p.m. Monday - Friday. Please note that voicemails left after 4:00 p.m. may not be returned until the following business day.  We are closed weekends and major holidays. You have access to a nurse at all times for urgent questions. Please call the main number to the clinic [phone] and follow the prompts.  For any non-urgent questions, you may also contact your provider using MyChart. We now offer e-Visits for anyone 72 and older to request care online for non-urgent symptoms. For details visit [email or web address].   Also download the MyChart app! Go to the app store, search "MyChart", open the app, select [facility], and log in with your MyChart username and password.  Due to Covid, a mask is required upon entering the hospital/clinic. If you do not have a mask, one will be given to you upon arrival. For doctor visits, patients may have 1 support person aged 72 or older with them. For treatment visits, patients cannot have anyone with them due to current Covid guidelines and our immunocompromised population.   Azacitidine suspension for injection (subcutaneous use) What is this medication? AZACITIDINE (ay [place]) is a chemotherapy drug. This medicine reduces the growth of cancer cells and can suppress the immune system. It is used for treating myelodysplastic syndrome or some types of leukemia. This medicine may be used for other purposes; ask  your health care provider or pharmacist if you have questions. COMMON BRAND NAME(S): Vidaza What should I tell my care team before I take this medication? They need to know if you have any of these conditions: kidney disease liver disease liver tumors an unusual or allergic  reaction to azacitidine, mannitol, other medicines, foods, dyes, or preservatives pregnant or trying to get pregnant breast-feeding How should I use this medication? This medicine is for injection under the skin. It is administered in a hospital or clinic by a specially trained health care professional. Talk to your pediatrician regarding the use of this medicine in children. While this drug may be prescribed for selected conditions, precautions do apply. Overdosage: If you think you have taken too much of this medicine contact a poison control center or emergency room at once. NOTE: This medicine is only for you. Do not share this medicine with others. What if I miss a dose? It is important not to miss your dose. Call your doctor or health care professional if you are unable to keep an appointment. What may interact with this medication? Interactions have not been studied. Give your health care provider a list of all the medicines, herbs, non-prescription drugs, or dietary supplements you use. Also tell them if you smoke, drink alcohol, or use illegal drugs. Some items may interact with your medicine. This list may not describe all possible interactions. Give your health care provider a list of all the medicines, herbs, non-prescription drugs, or dietary supplements you use. Also tell them if you smoke, drink alcohol, or use illegal drugs. Some items may interact with your medicine. What should I watch for while using this medication? Visit your doctor for checks on your progress. This drug may make you feel generally unwell. This is not uncommon, as chemotherapy can affect healthy cells as well as cancer cells. Report any side effects. Continue your course of treatment even though you feel ill unless your doctor tells you to stop. In some cases, you may be given additional medicines to help with side effects. Follow all directions for their use. Call your doctor or health care professional for  advice if you get a fever, chills or sore throat, or other symptoms of a cold or flu. Do not treat yourself. This drug decreases your body's ability to fight infections. Try to avoid being around people who are sick. This medicine may increase your risk to bruise or bleed. Call your doctor or health care professional if you notice any unusual bleeding. You may need blood work done while you are taking this medicine. Do not become pregnant while taking this medicine and for 6 months after the last dose. Women should inform their doctor if they wish to become pregnant or think they might be pregnant. Men should not father a child while taking this medicine and for 3 months after the last dose. There is a potential for serious side effects to an unborn child. Talk to your health care professional or pharmacist for more information. Do not breast-feed an infant while taking this medicine and for 1 week after the last dose. This medicine may interfere with the ability to have a child. Talk with your doctor or health care professional if you are concerned about your fertility. What side effects may I notice from receiving this medication? Side effects that you should report to your doctor or health care professional as soon as possible: allergic reactions like skin rash, itching or hives, swelling of the face, lips,  or tongue low blood counts - this medicine may decrease the number of white blood cells, red blood cells and platelets. You may be at increased risk for infections and bleeding. signs of infection - fever or chills, cough, sore throat, pain passing urine signs of decreased platelets or bleeding - bruising, pinpoint red spots on the skin, black, tarry stools, blood in the urine signs of decreased red blood cells - unusually weak or tired, fainting spells, lightheadedness signs and symptoms of kidney injury like trouble passing urine or change in the amount of urine signs and symptoms of liver  injury like dark yellow or brown urine; general ill feeling or flu-like symptoms; light-colored stools; loss of appetite; nausea; right upper belly pain; unusually weak or tired; yellowing of the eyes or skin Side effects that usually do not require medical attention (report to your doctor or health care professional if they continue or are bothersome): constipation diarrhea nausea, vomiting pain or redness at the injection site unusually weak or tired This list may not describe all possible side effects. Call your doctor for medical advice about side effects. You may report side effects to FDA at 1-800-FDA-1088. Where should I keep my medication? This drug is given in a hospital or clinic and will not be stored at home. NOTE: This sheet is a summary. It may not cover all possible information. If you have questions about this medicine, talk to your doctor, pharmacist, or health care provider.  2022 Elsevier/Gold Standard (2016-05-20 00:00:00)

## 2021-07-07 ENCOUNTER — Encounter: Payer: Self-pay | Admitting: Licensed Clinical Social Worker

## 2021-07-07 NOTE — Progress Notes (Signed)
Spoke with patient 07/07/21 @ 10:45 am. Need to arrive at 8:30 for 9:30 procedure, NPO after midnight, need for driver post procedure reviewed with patient. Patient verbalized understanding. ?

## 2021-07-07 NOTE — Progress Notes (Signed)
[facility] ?Clinical Social Work ? ?Clinical Social Work was referred by medical provider for assessment of psychosocial needs.  Clinical Social Worker contacted patient by phone  to offer support and assess for needs.  CSW left voicemail with contact information and request for return call. ? ? ? , LCSW  ?Clinical Social Worker ?[facility] ?      ?First Attempt ?

## 2021-07-08 ENCOUNTER — Other Ambulatory Visit: Payer: Self-pay | Admitting: Radiology

## 2021-07-09 ENCOUNTER — Encounter: Payer: Self-pay | Admitting: Radiology

## 2021-07-09 ENCOUNTER — Ambulatory Visit
Admission: RE | Admit: 2021-07-09 | Discharge: 2021-07-09 | Disposition: A | Discharge status: Home / Self Care | Payer: PPO | Source: Ambulatory Visit | Attending: Oncology | Admitting: Oncology

## 2021-07-09 ENCOUNTER — Other Ambulatory Visit: Payer: Self-pay

## 2021-07-09 DIAGNOSIS — C9250 Acute myelomonocytic leukemia, not having achieved remission: Secondary | ICD-10-CM | POA: Diagnosis not present

## 2021-07-09 DIAGNOSIS — C9200 Acute myeloblastic leukemia, not having achieved remission: Secondary | ICD-10-CM | POA: Diagnosis not present

## 2021-07-09 DIAGNOSIS — D696 Thrombocytopenia, unspecified: Secondary | ICD-10-CM | POA: Insufficient documentation

## 2021-07-09 DIAGNOSIS — D72819 Decreased white blood cell count, unspecified: Secondary | ICD-10-CM | POA: Diagnosis not present

## 2021-07-09 DIAGNOSIS — D7589 Other specified diseases of blood and blood-forming organs: Secondary | ICD-10-CM | POA: Diagnosis not present

## 2021-07-09 HISTORY — PX: IR BONE MARROW BIOPSY & ASPIRATION: IMG5727

## 2021-07-09 LAB — CBC WITH DIFFERENTIAL/PLATELET
Abs Immature Granulocytes: 0 10*3/uL (ref 0.00–0.07)
Basophils Absolute: 0 10*3/uL (ref 0.0–0.1)
Basophils Relative: 1 %
Eosinophils Absolute: 0.1 10*3/uL (ref 0.0–0.5)
Eosinophils Relative: 3 %
HCT: 36.3 % (ref 36.0–46.0)
Hemoglobin: 12.1 g/dL (ref 12.0–15.0)
Immature Granulocytes: 0 %
Lymphocytes Relative: 33 %
Lymphs Abs: 0.6 10*3/uL — ABNORMAL LOW (ref 0.7–4.0)
MCH: 27.6 pg (ref 26.0–34.0)
MCHC: 33.3 g/dL (ref 30.0–36.0)
MCV: 82.9 fL (ref 80.0–100.0)
Monocytes Absolute: 0.2 10*3/uL (ref 0.1–1.0)
Monocytes Relative: 12 %
Neutro Abs: 0.9 10*3/uL — ABNORMAL LOW (ref 1.7–7.7)
Neutrophils Relative %: 51 %
Platelets: 122 10*3/uL — ABNORMAL LOW (ref 150–400)
RBC: 4.38 MIL/uL (ref 3.87–5.11)
RDW: 14.9 % (ref 11.5–15.5)
Smear Review: NORMAL
WBC: 1.8 10*3/uL — ABNORMAL LOW (ref 4.0–10.5)
nRBC: 0 % (ref 0.0–0.2)

## 2021-07-09 LAB — PATHOLOGIST SMEAR REVIEW

## 2021-07-09 MED ORDER — SODIUM CHLORIDE 0.9 % IV SOLN
INTRAVENOUS | Status: DC
  Filled 2021-07-09: qty 1000

## 2021-07-09 MED ORDER — MIDAZOLAM HCL 2 MG/2ML IJ SOLN
INTRAMUSCULAR | Status: AC | PRN
  Administered 2021-07-09: 1 mg via INTRAVENOUS

## 2021-07-09 MED ORDER — MIDAZOLAM HCL 2 MG/2ML IJ SOLN
INTRAMUSCULAR | Status: AC
  Filled 2021-07-09: qty 2

## 2021-07-09 MED ORDER — MIDAZOLAM HCL 5 MG/5ML IJ SOLN
INTRAMUSCULAR | Status: AC | PRN
  Administered 2021-07-09: 1 mg via INTRAVENOUS

## 2021-07-09 MED ORDER — HEPARIN SOD (PORK) LOCK FLUSH 100 UNIT/ML IV SOLN
INTRAVENOUS | Status: AC
  Filled 2021-07-09: qty 5

## 2021-07-09 MED ORDER — FENTANYL CITRATE (PF) 100 MCG/2ML IJ SOLN
INTRAMUSCULAR | Status: AC | PRN
  Administered 2021-07-09 (×2): 50 ug via INTRAVENOUS

## 2021-07-09 MED ORDER — FENTANYL CITRATE (PF) 100 MCG/2ML IJ SOLN
INTRAMUSCULAR | Status: AC
  Filled 2021-07-09: qty 2

## 2021-07-09 NOTE — H&P (Signed)
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 The patient follows with Dr. Grayland Hale and was just seen in the office on 2/28 and request received for image guided bone marrow biopsy with moderate sedation, patient has previously underwent this procedure with [place] last year and tolerated well.   The patient has had a H&P performed within the last 30 days, all history, medications, and exam have been reviewed. The patient denies any interval changes since the H&P.  The patient denies any current chest pain or shortness of breath. She has no known complications to sedation.    Past Medical History:  Diagnosis Date   Anxiety    Arthritis    Osteoarthritis   Breast cancer (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   s/p surgery and XRT   Osteoporosis    Other and unspecified hyperlipidemia    Palpitations    Personal history of chemotherapy    Personal history of malignant neoplasm of large intestine    carcinoma - cecum, s/p right laparoscopic colectomy - s/p chemotherapy and XRT   Personal history of radiation therapy    Pneumonia 2019   PONV (postoperative nausea and vomiting)    Pure hypercholesterolemia    Unspecified hereditary and idiopathic peripheral neuropathy     Past Surgical  History:  Procedure Laterality Date   APPENDECTOMY  1985   BREAST BIOPSY Right 2009   positive   BREAST BIOPSY Right 2009   negative   BREAST LUMPECTOMY Right 2009   breast cancer   CHOLECYSTECTOMY  1995   COLONOSCOPY     COLONOSCOPY WITH PROPOFOL N/A 10/29/2017   Procedure: COLONOSCOPY WITH PROPOFOL;  Surgeon: Debra Silvas, MD;  Location[facility] ENDOSCOPY;  Service: Endoscopy;  Laterality: N/A;   DILATION AND CURETTAGE OF UTERUS  1990   DILATION AND CURETTAGE, DIAGNOSTIC / THERAPEUTIC  1990   ESOPHAGOGASTRODUODENOSCOPY     ESOPHAGOGASTRODUODENOSCOPY (EGD) WITH PROPOFOL N/A 10/29/2017   Procedure: ESOPHAGOGASTRODUODENOSCOPY (EGD) WITH PROPOFOL;  Surgeon: Debra Silvas, MD;  Location: [facility] ENDOSCOPY;  Service: Endoscopy;  Laterality: N/A;   INCISION AND DRAINAGE PERIRECTAL ABSCESS N/A 06/17/2020   Procedure: IRRIGATION AND DEBRIDEMENT PERIRECTAL ABSCESS;  Surgeon: Debra Husbands, MD;  Location: [facility] ORS;  Service: General;  Laterality: N/A;   LAPAROSCOPIC PARTIAL COLECTOMY     stage 3-C carcinoma of the cecum, s/p chemotherapy and xrt   LITHOTRIPSY     SIGMOIDOSCOPY  08/26/1993   THYROID LOBECTOMY  2002   s/p XRT   TOTAL HIP ARTHROPLASTY Right 10/24/2019   Procedure: TOTAL HIP ARTHROPLASTY;  Surgeon: Debra Mull, MD;  Location: [facility] ORS;  Service: Orthopedics;  Laterality: Right;    Allergies: Demeclocycline, Sulfa antibiotics, Tetracyclines & related, Augmentin [  amoxicillin-pot clavulanate], Bentyl [dicyclomine hcl], Ciprofloxacin, Codeine, Dicyclomine hcl, Epinephrine, Flagyl [metronidazole], Librax [chlordiazepoxide-clidinium], Novocain [procaine], Phenobarbital, Prednisone, and Ultram [tramadol]  Medications: Prior to Admission medications   Medication Sig Start Date End Date Taking? Authorizing Provider  clonazePAM (KLONOPIN) 0.5 MG tablet Take 1.5 mg by mouth 2 (two) times daily.  09/06/17  Yes [provider]  escitalopram (LEXAPRO) 20 MG tablet Take 20 mg by  mouth every morning.    Yes [provider]  levothyroxine (SYNTHROID) 125 MCG tablet TAKE 1 TABLET EVERY DAY ON EMPTY STOMACHWITH A GLASS OF WATER AT LEAST 30-60 MINBEFORE BREAKFAST 04/14/21  Yes Debra Pheasant, MD  magnesium oxide (MAG-OX) 400 MG tablet Take 1 tablet (400 mg total) by mouth daily. 05/09/21  Yes Debra Huger, MD  metoprolol tartrate (LOPRESSOR) 25 MG tablet TAKE 1 TABLET (25 MG TOTAL) BY MOUTH AS NEEDED (AS NEEDED UP TO TWICE DAILY FOR PALPITATIONS). 09/04/20  Yes Debra Dubonnet, NP  oxyCODONE-acetaminophen (PERCOCET/ROXICET) 5-325 MG tablet Take 1 tablet by mouth every 6 (six) hours as needed for severe pain. 04/29/21  Yes Debra Kitten, MD  pantoprazole (PROTONIX) 40 MG tablet TAKE ONE TABLET (40 MG) BY MOUTH EVERY DAY 12/20/20  Yes Debra Pheasant, MD  Azelastine-Fluticasone 137-50 MCG/ACT SUSP SPRAY 2 SPRAYS INTO EACH NOSTRIL EVERY DAY 05/26/21   Debra Pheasant, MD  diphenoxylate-atropine (LOMOTIL) 2.5-0.025 MG tablet Take 1 tablet by mouth 4 (four) times daily as needed for diarrhea or loose stools. 03/04/21   Debra Huger, MD  loperamide (IMODIUM) 2 MG capsule Take by mouth.    [provider]  potassium chloride SA (KLOR-CON) 20 MEQ tablet TAKE ONE (1) TABLET BY MOUTH TWO TIMES PER DAY 02/24/21   Debra Huger, MD  prochlorperazine (COMPAZINE) 10 MG tablet Take 1 tablet (10 mg total) by mouth every 6 (six) hours as needed for refractory nausea / vomiting. 02/07/21   Debra Au, NP  valACYclovir (VALTREX) 500 MG tablet Take 1 tablet (500 mg total) by mouth daily. 06/09/21   Debra Huger, MD     Family History  Problem Relation Age of Onset   Stroke Mother        21s   Alzheimer's disease Mother    Cancer Mother    Lung cancer Father    Prostate cancer Father    Cancer Father        Colon   Colon cancer Father    Breast cancer Sister        20's   Lung cancer Sister    Breast cancer Maternal Aunt     Social History    Socioeconomic History   Marital status: Single    Spouse name: Not on file   Number of children: 0   Years of education: Not on file   Highest education level: Not on file  Occupational History   Not on file  Tobacco Use   Smoking status: Never   Smokeless tobacco: Never  Vaping Use   Vaping Use: Never used  Substance and Sexual Activity   Alcohol use: No    Alcohol/week: 0.0 standard drinks   Drug use: No   Sexual activity: Not Currently  Other Topics Concern   Not on file  Social History Narrative   Not on file   Social Determinants of Health   Financial Resource Strain: Not on file  Food Insecurity: Not on file  Transportation Needs: Not on file  Physical Activity: Not on file  Stress:  Not on file  Social Connections: Not on file   Review of Systems: A 12 point ROS discussed and pertinent positives are indicated in the HPI above.  All other systems are negative.  Review of Systems  Vital Signs: BP 136/78    Pulse (!) 58    Temp 98 F (36.7 C) (Oral)    Resp 19    SpO2 96%   Physical Exam Constitutional:      Appearance: Normal appearance.  HENT:     Head: Normocephalic and atraumatic.  Cardiovascular:     Rate and Rhythm: Bradycardia present.  Pulmonary:     Effort: Pulmonary effort is normal. No respiratory distress.  Skin:    General: Skin is warm and dry.  Neurological:     Mental Status: She is alert and oriented to person, place, and time.    Imaging: No results found.  Labs:  CBC: Recent Labs    05/06/21 0915 06/02/21 1301 06/30/21 1308 07/09/21 0921  WBC 1.8* 2.1* 1.5* 1.8*  HGB 12.8 13.0 13.2 12.1  HCT 38.4 38.7 39.6 36.3  PLT 154 159 149* 122*    COAGS: No results for input(s): INR, APTT in the last 8760 hours.  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 The patient follows with Dr. Grayland Hale and was just seen in the office on 2/28 and request received for image guided bone marrow biopsy with moderate sedation, patient has previously underwent this procedure with [place] last year and tolerated well.   The patient has been NPO, labs and vitals have been reviewed.  Risks and benefits of image guided bone marrow biopsy with moderate sedation was discussed with the patient and/or patient's family including, but not limited to bleeding, infection, damage to adjacent structures or low yield requiring additional tests.  All of the questions were answered and there is agreement to proceed.  Consent signed and in chart.   Thank you for this interesting consult.  I greatly enjoyed meeting Debra Hale and look forward to participating in their care.  A copy of this report was sent to the requesting provider on this date.  Electronically Signed: Hedy Jacob, PA-C 07/09/2021, 9:51 AM   I spent a total of 15 Minutes in face to face in clinical consultation, greater than 50% of which was counseling/coordinating care for AML.

## 2021-07-09 NOTE — Progress Notes (Signed)
Patient clinically stable post BMB per Dr Denna Haggard, tolerated well. Received Versed 2 mg along with Fentanyl 100 mcg IV for procedure. Vitals stable pre and post. Report given to [facility] RN in specials post procedure. ?

## 2021-07-09 NOTE — Procedures (Signed)
Interventional Radiology Procedure Note ? ?Date of Procedure: 07/09/2021  ?Procedure: BMBx ? ?Findings:  ?1. CT BMBx right posterior ilium   ? ?Complications: No immediate complications noted.  ? ?Estimated Blood Loss: minimal ? ?Follow-up and Recommendations: ?1. Bedrest 1 hour  ? ? ?Albin Felling, MD  ?Vascular & Interventional Radiology  ?07/09/2021 10:24 AM ? ? ? ?

## 2021-07-11 LAB — SURGICAL PATHOLOGY

## 2021-07-14 ENCOUNTER — Encounter: Payer: Self-pay | Admitting: Licensed Clinical Social Worker

## 2021-07-14 DIAGNOSIS — C9201 Acute myeloblastic leukemia, in remission: Principal | ICD-10-CM

## 2021-07-14 NOTE — Progress Notes (Signed)
[facility] CSW Progress Note ? ?Clinical Social Worker contacted patient by phone to follow-up on referral. Patent stated she was unable to speak at this moment.  CSW and patient scheduled call for 3:30PM this afternoon 07/14/2021. ? ? ? ?  , LCSW ?

## 2021-07-17 ENCOUNTER — Encounter (HOSPITAL_COMMUNITY): Payer: Self-pay | Admitting: Oncology

## 2021-07-17 ENCOUNTER — Encounter
Admit: 2021-07-17 | Discharge: 2021-07-17 | Payer: PRIVATE HEALTH INSURANCE | Attending: Adult Health | Primary: Adult Health

## 2021-07-17 ENCOUNTER — Other Ambulatory Visit: Admit: 2021-07-17 | Discharge: 2021-07-17 | Payer: PRIVATE HEALTH INSURANCE

## 2021-07-17 ENCOUNTER — Ambulatory Visit: Admit: 2021-07-17 | Discharge: 2021-07-17 | Payer: PRIVATE HEALTH INSURANCE

## 2021-07-17 ENCOUNTER — Ambulatory Visit
Admit: 2021-07-17 | Discharge: 2021-07-17 | Payer: PRIVATE HEALTH INSURANCE | Attending: Adult Health | Primary: Adult Health

## 2021-07-17 DIAGNOSIS — C9201 Acute myeloblastic leukemia, in remission: Secondary | ICD-10-CM | POA: Diagnosis not present

## 2021-07-21 ENCOUNTER — Encounter: Payer: Self-pay | Admitting: Licensed Clinical Social Worker

## 2021-07-21 NOTE — Progress Notes (Signed)
[facility] Clinical Social Work  ?Initial Assessment ? ? ?Debra Hale is a 71 y.o. year old female contacted by phone. Clinical Social Work was referred by medical provider for assessment of psychosocial needs.  ? ?SDOH (Social Determinants of Health) assessments performed: Yes ?  ?Distress Screen completed: No ?No flowsheet data found. ? ? ? ?Family/Social Information:  ?Housing Arrangement: patient lives alone. ?Family members/support persons in your life? Patient stated she only has one close friend, Debra Hale. ?Transportation concerns: no  ?Employment: Retired. Income source: Debra Hale   ?Financial concerns:  Patient has concerns about medical bills ?Type of concern: Medical bills ?Food access concerns: no ?Religious or spiritual practice: no ?Services Currently in place:  [employer] ? ?Coping/ Adjustment to diagnosis: ?Patient understands treatment plan and what happens next? yes ?Concerns about diagnosis and/or treatment: I'm not especially worried about anything ?Patient reported stressors: Isolation/ feeling alone ?Hopes and priorities: N/A ?Patient enjoys being outside ?Current coping skills/ strengths: Ability for insight , Average or above average intelligence , Capable of independent living , Communication skills , General fund of knowledge , and Motivation for treatment/growth  ? ? ? SUMMARY: ?Current SDOH Barriers:  ?Financial constraints related to fixed income ? ?Clinical Social Work Clinical Goal(s):  ?patient will follow up with [facility] as directed by SW ? ?Interventions: ?Discussed common feeling and emotions when being diagnosed with cancer, and the importance of support during treatment ?Informed patient of the support team roles and support services at [facility] ?Provided CSW contact information and encouraged patient to call with any questions or concerns ?Referred patient to [facility].  Explained role of CSW in  patient car and available resources.  Patient stated she is interested in groups offered by [facility] and would like more information, patient requested information be sent via [place] mail.  ? ? ?Follow Up Plan: Patient will contact CSW with any support or resource needs ?Patient verbalizes understanding of plan: Yes ? ? ? ? , LCSW ?

## 2021-07-28 ENCOUNTER — Inpatient Hospital Stay: Payer: PPO

## 2021-07-28 ENCOUNTER — Other Ambulatory Visit: Payer: Self-pay

## 2021-07-28 VITALS — BP 145/75 | HR 69 | Temp 97.9°F | Wt 128.5 lb

## 2021-07-28 DIAGNOSIS — Z5111 Encounter for antineoplastic chemotherapy: Secondary | ICD-10-CM | POA: Diagnosis not present

## 2021-07-28 DIAGNOSIS — C9200 Acute myeloblastic leukemia, not having achieved remission: Secondary | ICD-10-CM

## 2021-07-28 LAB — CBC WITH DIFFERENTIAL/PLATELET
Abs Immature Granulocytes: 0 10*3/uL (ref 0.00–0.07)
Basophils Absolute: 0 10*3/uL (ref 0.0–0.1)
Basophils Relative: 1 %
Eosinophils Absolute: 0 10*3/uL (ref 0.0–0.5)
Eosinophils Relative: 1 %
HCT: 36.5 % (ref 36.0–46.0)
Hemoglobin: 12.4 g/dL (ref 12.0–15.0)
Immature Granulocytes: 0 %
Lymphocytes Relative: 44 %
Lymphs Abs: 0.5 10*3/uL — ABNORMAL LOW (ref 0.7–4.0)
MCH: 28.1 pg (ref 26.0–34.0)
MCHC: 34 g/dL (ref 30.0–36.0)
MCV: 82.6 fL (ref 80.0–100.0)
Monocytes Absolute: 0.1 10*3/uL (ref 0.1–1.0)
Monocytes Relative: 6 %
Neutro Abs: 0.6 10*3/uL — ABNORMAL LOW (ref 1.7–7.7)
Neutrophils Relative %: 48 %
Platelets: 136 10*3/uL — ABNORMAL LOW (ref 150–400)
RBC: 4.42 MIL/uL (ref 3.87–5.11)
RDW: 15.8 % — ABNORMAL HIGH (ref 11.5–15.5)
WBC: 1.2 10*3/uL — CL (ref 4.0–10.5)
nRBC: 0 % (ref 0.0–0.2)

## 2021-07-28 LAB — COMPREHENSIVE METABOLIC PANEL
ALT: 17 U/L (ref 0–44)
AST: 14 U/L — ABNORMAL LOW (ref 15–41)
Albumin: 3.4 g/dL — ABNORMAL LOW (ref 3.5–5.0)
Alkaline Phosphatase: 99 U/L (ref 38–126)
Anion gap: 6 (ref 5–15)
BUN: 15 mg/dL (ref 8–23)
CO2: 26 mmol/L (ref 22–32)
Calcium: 8.4 mg/dL — ABNORMAL LOW (ref 8.9–10.3)
Chloride: 108 mmol/L (ref 98–111)
Creatinine, Ser: 0.81 mg/dL (ref 0.44–1.00)
GFR, Estimated: >60 mL/min (ref >60)
Glucose, Bld: 91 mg/dL (ref 70–99)
Potassium: 3.3 mmol/L — ABNORMAL LOW (ref 3.5–5.1)
Sodium: 140 mmol/L (ref 135–145)
Total Bilirubin: 0.6 mg/dL (ref 0.3–1.2)
Total Protein: 6.4 g/dL — ABNORMAL LOW (ref 6.5–8.1)

## 2021-07-28 LAB — MAGNESIUM: Magnesium: 1.7 mg/dL (ref 1.7–2.4)

## 2021-07-28 MED ORDER — AZACITIDINE CHEMO SQ INJECTION
50.0000 mg/m2 | Freq: Once | INTRAMUSCULAR | Status: AC
  Administered 2021-07-28: 87.5 mg via SUBCUTANEOUS
  Filled 2021-07-28: qty 3.5

## 2021-07-28 MED ORDER — ONDANSETRON HCL 4 MG PO TABS
8.0000 mg | ORAL_TABLET | Freq: Once | ORAL | Status: AC
  Administered 2021-07-28: 8 mg via ORAL
  Filled 2021-07-28: qty 2

## 2021-07-28 NOTE — Patient Instructions (Signed)
[facility] CANCER CTR AT [place]  Discharge Instructions: ?Thank you for choosing [facility] to provide your oncology and hematology care.  ?If you have a lab appointment with the [place], please go directly to the [facility] and check in at the registration area. ? ?Wear comfortable clothing and clothing appropriate for easy access to any Portacath or PICC line.  ? ?We strive to give you quality time with your provider. You may need to reschedule your appointment if you arrive late (15 or more minutes).  Arriving late affects you and other patients whose appointments are after yours.  Also, if you miss three or more appointments without notifying the office, you may be dismissed from the clinic at the provider?s discretion.    ?  ?For prescription refill requests, have your pharmacy contact our office and allow 72 hours for refills to be completed.   ? ?Today you received the following chemotherapy and/or immunotherapy agents vidaza ?  ?To help prevent nausea and vomiting after your treatment, we encourage you to take your nausea medication as directed. ? ?BELOW ARE SYMPTOMS THAT SHOULD BE REPORTED IMMEDIATELY: ?*FEVER GREATER THAN 100.4 F (38 ?C) OR HIGHER ?*CHILLS OR SWEATING ?*NAUSEA AND VOMITING THAT IS NOT CONTROLLED WITH YOUR NAUSEA MEDICATION ?*UNUSUAL SHORTNESS OF BREATH ?*UNUSUAL BRUISING OR BLEEDING ?*URINARY PROBLEMS (pain or burning when urinating, or frequent urination) ?*BOWEL PROBLEMS (unusual diarrhea, constipation, pain near the anus) ?TENDERNESS IN MOUTH AND THROAT WITH OR WITHOUT PRESENCE OF ULCERS (sore throat, sores in mouth, or a toothache) ?UNUSUAL RASH, SWELLING OR PAIN  ?UNUSUAL VAGINAL DISCHARGE OR ITCHING  ? ?Items with * indicate a potential emergency and should be followed up as soon as possible or go to the Emergency Department if any problems should occur. ? ?Please show the CHEMOTHERAPY ALERT CARD or IMMUNOTHERAPY ALERT CARD at check-in to the  Emergency Department and triage nurse. ? ?Should you have questions after your visit or need to cancel or reschedule your appointment, please contact [facility] CANCER [place] AT [place]  [phone] and follow the prompts.  Office hours are 8:00 a.m. to 4:30 p.m. Monday - Friday. Please note that voicemails left after 4:00 p.m. may not be returned until the following business day.  We are closed weekends and major holidays. You have access to a nurse at all times for urgent questions. Please call the main number to the clinic [phone] and follow the prompts. ? ?For any non-urgent questions, you may also contact your provider using MyChart. We now offer e-Visits for anyone 72 and older to request care online for non-urgent symptoms. For details visit [email or web address]. ?  ?Also download the MyChart app! Go to the app store, search "MyChart", open the app, select [place], and log in with your MyChart username and password. ? ?Due to Covid, a mask is required upon entering the hospital/clinic. If you do not have a mask, one will be given to you upon arrival. For doctor visits, patients may have 1 support person aged 72 or older with them. For treatment visits, patients cannot have anyone with them due to current Covid guidelines and our immunocompromised population.  ?

## 2021-07-29 ENCOUNTER — Ambulatory Visit
Admission: RE | Admit: 2021-07-29 | Discharge: 2021-07-29 | Disposition: A | Discharge status: Home / Self Care | Payer: PPO | Source: Ambulatory Visit | Attending: Internal Medicine | Admitting: Internal Medicine

## 2021-07-29 ENCOUNTER — Encounter: Payer: Self-pay | Admitting: Oncology

## 2021-07-29 ENCOUNTER — Inpatient Hospital Stay: Payer: PPO

## 2021-07-29 ENCOUNTER — Inpatient Hospital Stay (HOSPITAL_BASED_OUTPATIENT_CLINIC_OR_DEPARTMENT_OTHER): Payer: PPO | Admitting: Oncology

## 2021-07-29 VITALS — BP 124/68 | HR 79 | Temp 96.5°F | Resp 16 | Ht 64.0 in | Wt 129.0 lb

## 2021-07-29 DIAGNOSIS — M8589 Other specified disorders of bone density and structure, multiple sites: Secondary | ICD-10-CM | POA: Diagnosis not present

## 2021-07-29 DIAGNOSIS — M810 Age-related osteoporosis without current pathological fracture: Secondary | ICD-10-CM

## 2021-07-29 DIAGNOSIS — Z1231 Encounter for screening mammogram for malignant neoplasm of breast: Secondary | ICD-10-CM | POA: Diagnosis not present

## 2021-07-29 DIAGNOSIS — C9200 Acute myeloblastic leukemia, not having achieved remission: Secondary | ICD-10-CM

## 2021-07-29 DIAGNOSIS — Z5111 Encounter for antineoplastic chemotherapy: Secondary | ICD-10-CM | POA: Diagnosis not present

## 2021-07-29 MED ORDER — ONDANSETRON HCL 4 MG PO TABS
8.0000 mg | ORAL_TABLET | Freq: Once | ORAL | Status: AC
  Administered 2021-07-29: 8 mg via ORAL
  Filled 2021-07-29: qty 2

## 2021-07-29 MED ORDER — AZACITIDINE CHEMO SQ INJECTION
50.0000 mg/m2 | Freq: Once | INTRAMUSCULAR | Status: AC
  Administered 2021-07-29: 87.5 mg via SUBCUTANEOUS
  Filled 2021-07-29: qty 3.5

## 2021-07-29 NOTE — Progress Notes (Signed)
Pt received vidaza yesterday at 1502 . Ok per [employer] RPH to receive early today.  ?

## 2021-07-29 NOTE — Progress Notes (Signed)
?[place] [facility]  ?[phone] [phone] ? ?ID: Debra Hale OB: Jul 10, 1950  MR#: [id number]  XBM#:[id number] ? ?Patient Care Team: ?[name], MD as PCP - General (Internal Medicine) ?Wellington Hampshire, MD as PCP - Cardiology (Cardiology) ? ?CHIEF COMPLAINT: AML. ? ?INTERVAL HISTORY: Patient returns to clinic today for further evaluation, discussion of her bone marrow biopsy results, and continuation of maintenance Vidaza.  She currently feels well and is asymptomatic.  She continues to tolerate her treatments well without significant side effects. She has chronic weakness and fatigue.  She denies any pain.  She has no neurologic complaints.  She denies any recent fevers or illnesses. She has no chest pain, shortness of breath, cough, or hemoptysis.  She denies any nausea, vomiting, constipation, or diarrhea.  She has no urinary complaints.  Patient offers no specific complaints today. ? ?REVIEW OF SYSTEMS:   ?Review of Systems  ?Constitutional: Negative.  Negative for fever, malaise/fatigue and weight loss.  ?Respiratory: Negative.  Negative for cough, hemoptysis and shortness of breath.   ?Cardiovascular: Negative.  Negative for chest pain and leg swelling.  ?Gastrointestinal:  Negative for abdominal pain and diarrhea.  ?Genitourinary: Negative.  Negative for dysuria.  ?Musculoskeletal: Negative.  Negative for back pain and falls.  ?Skin: Negative.  Negative for rash.  ?Neurological: Negative.  Negative for dizziness, focal weakness, weakness and headaches.  ?Psychiatric/Behavioral: Negative.  Negative for memory loss. The patient is not nervous/anxious.   ? ?As per HPI. Otherwise, a complete review of systems is negative. ? ?PAST MEDICAL HISTORY: ?Past Medical History:  ?Diagnosis Date  ? Anxiety   ? Arthritis   ? Osteoarthritis  ? Breast cancer ([place]) 2009  ? right breast lumpectomy with rad tx  ? Colon cancer ([place])   ? surgery with chemo and rad tx  ? Complication of  anesthesia   ? GERD (gastroesophageal reflux disease)   ? Heart palpitations   ? History of hiatal hernia   ? History of kidney stones   ? Hypothyroidism   ? Liver disease   ? Liver nodule   ? s/p negative biopsy  ? Malignant neoplasm of thyroid gland ([place]) 2002  ? s/p surgery and XRT  ? Osteoporosis   ? Other and unspecified hyperlipidemia   ? Palpitations   ? Personal history of chemotherapy   ? Personal history of malignant neoplasm of large intestine   ? carcinoma - cecum, s/p right laparoscopic colectomy - s/p chemotherapy and XRT  ? Personal history of radiation therapy   ? Pneumonia 2019  ? PONV (postoperative nausea and vomiting)   ? Pure hypercholesterolemia   ? Unspecified hereditary and idiopathic peripheral neuropathy   ? ? ?PAST SURGICAL HISTORY: ?Past Surgical History:  ?Procedure Laterality Date  ? APPENDECTOMY  1985  ? BREAST BIOPSY Right 2009  ? positive  ? BREAST BIOPSY Right 2009  ? negative  ? BREAST LUMPECTOMY Right 2009  ? breast cancer  ? CHOLECYSTECTOMY  1995  ? COLONOSCOPY    ? COLONOSCOPY WITH PROPOFOL N/A 10/29/2017  ? Procedure: COLONOSCOPY WITH PROPOFOL;  Surgeon: Manya Silvas, MD;  Location[facility] ENDOSCOPY;  Service: Endoscopy;  Laterality: N/A;  ? [place] OF UTERUS  1990  ? DILATION AND CURETTAGE, DIAGNOSTIC / THERAPEUTIC  1990  ? ESOPHAGOGASTRODUODENOSCOPY    ? ESOPHAGOGASTRODUODENOSCOPY (EGD) WITH PROPOFOL N/A 10/29/2017  ? Procedure: ESOPHAGOGASTRODUODENOSCOPY (EGD) WITH PROPOFOL;  Surgeon: Manya Silvas, MD;  Location: [facility] ENDOSCOPY;  Service: Endoscopy;  Laterality:  N/A;  ? INCISION AND DRAINAGE PERIRECTAL ABSCESS N/A 06/17/2020  ? Procedure: IRRIGATION AND DEBRIDEMENT PERIRECTAL ABSCESS;  Surgeon: Jules Husbands, MD;  Location: [facility] ORS;  Service: General;  Laterality: N/A;  ? IR BONE MARROW BIOPSY & ASPIRATION  07/09/2021  ? LAPAROSCOPIC PARTIAL COLECTOMY    ? stage 3-C carcinoma of the cecum, s/p chemotherapy and xrt  ? LITHOTRIPSY    ? SIGMOIDOSCOPY   08/26/1993  ? THYROID LOBECTOMY  2002  ? s/p XRT  ? TOTAL HIP ARTHROPLASTY Right 10/24/2019  ? Procedure: TOTAL HIP ARTHROPLASTY;  Surgeon: Corky Mull, MD;  Location: [facility] ORS;  Service: Orthopedics;  Laterality: Right;  ? ? ?FAMILY HISTORY: ?Family History  ?Problem Relation Age of Onset  ? Stroke Mother   ?     63s  ? Alzheimer's disease Mother   ? Cancer Mother   ? Lung cancer Father   ? Prostate cancer Father   ? Cancer Father   ?     Colon  ? Colon cancer Father   ? Breast cancer Sister   ?     97's  ? Lung cancer Sister   ? Breast cancer Maternal Aunt   ? ? ?ADVANCED DIRECTIVES (Y/N):  N ? ?HEALTH MAINTENANCE: ?Social History  ? ?Tobacco Use  ? Smoking status: Never  ? Smokeless tobacco: Never  ?Vaping Use  ? Vaping Use: Never used  ?Substance Use Topics  ? Alcohol use: No  ?  Alcohol/week: 0.0 standard drinks  ? Drug use: No  ? ? ? Colonoscopy: ? PAP: ? Bone density: ? Lipid panel: ? ?Allergies  ?Allergen Reactions  ? Demeclocycline Other (See Comments)  ?  Throat swells  ? Sulfa Antibiotics Other (See Comments)  ?  Other reaction(s): Other (See Comments) ?Throat swells ?Other reaction(s): Other (See Comments) ?Throat swells  ? Tetracyclines & Related Other (See Comments)  ?  Throat swells ?  ? Augmentin [Amoxicillin-Pot Clavulanate] Diarrhea  ? Bentyl [Dicyclomine Hcl]   ?  unkn  ? Ciprofloxacin Diarrhea  ? Codeine Other (See Comments)  ?  dizziness ? ?  ? Dicyclomine Hcl Other (See Comments)  ?  unkn  ? Epinephrine   ?  Speeds heart up - panic   ? Flagyl [Metronidazole] Nausea And Vomiting  ? Librax [Chlordiazepoxide-Clidinium]   ?  unkn  ? Novocain [Procaine] Other (See Comments)  ?  "Shaky"  ? Phenobarbital   ?  unkn  ? Prednisone   ?  Nervous  ?  ? Ultram [Tramadol] Other (See Comments)  ?  Sick feeling  ? ? ?Current Outpatient Medications  ?Medication Sig Dispense Refill  ? Azelastine-Fluticasone 137-50 MCG/ACT SUSP SPRAY 2 SPRAYS INTO EACH NOSTRIL EVERY DAY 23 g 3  ? clonazePAM (KLONOPIN) 0.5 MG  tablet Take 1.5 mg by mouth 2 (two) times daily.   0  ? diphenoxylate-atropine (LOMOTIL) 2.5-0.025 MG tablet Take 1 tablet by mouth 4 (four) times daily as needed for diarrhea or loose stools. 120 tablet 1  ? escitalopram (LEXAPRO) 20 MG tablet Take 20 mg by mouth every morning.     ? levothyroxine (SYNTHROID) 125 MCG tablet TAKE 1 TABLET EVERY DAY ON EMPTY STOMACHWITH A GLASS OF WATER AT LEAST 30-60 MINBEFORE BREAKFAST 90 tablet 1  ? loperamide (IMODIUM) 2 MG capsule Take by mouth.    ? pantoprazole (PROTONIX) 40 MG tablet TAKE ONE TABLET (40 MG) BY MOUTH EVERY DAY 90 tablet 2  ? valACYclovir (VALTREX) 500 MG tablet Take 1  tablet (500 mg total) by mouth daily. 90 tablet 3  ? metoprolol tartrate (LOPRESSOR) 25 MG tablet TAKE 1 TABLET (25 MG TOTAL) BY MOUTH AS NEEDED (AS NEEDED UP TO TWICE DAILY FOR PALPITATIONS). (Patient not taking: Reported on 07/29/2021) 60 tablet 1  ? oxyCODONE-acetaminophen (PERCOCET/ROXICET) 5-325 MG tablet Take 1 tablet by mouth every 6 (six) hours as needed for severe pain. (Patient not taking: Reported on 07/29/2021) 16 tablet 0  ? potassium chloride SA (KLOR-CON) 20 MEQ tablet TAKE ONE (1) TABLET BY MOUTH TWO TIMES PER DAY (Patient not taking: Reported on 07/29/2021) 60 tablet 0  ? prochlorperazine (COMPAZINE) 10 MG tablet Take 1 tablet (10 mg total) by mouth every 6 (six) hours as needed for refractory nausea / vomiting. (Patient not taking: Reported on 07/29/2021) 30 tablet 0  ? ?Current Facility-Administered Medications  ?Medication Dose Route Frequency Provider Last Rate Last Admin  ? ondansetron (ZOFRAN) tablet 8 mg  8 mg Oral Once Jodelle Green, FNP      ? ?Facility-Administered Medications Ordered in Other Visits  ?Medication Dose Route Frequency Provider Last Rate Last Admin  ? potassium chloride (KLOR-CON) CR tablet 40 mEq  40 mEq Oral BID Lloyd Huger, MD   40 mEq at 07/08/20 1136  ? ? ?OBJECTIVE: ?Vitals:  ? 07/29/21 1051  ?BP: 124/68  ?Pulse: 79  ?Resp: 16  ?Temp: (!) 96.5  ?F (35.8 ?C)  ?SpO2: 100%  ?   Body mass index is 22.14 kg/m?Marland Kitchen    ECOG FS:0 - Asymptomatic ? ?General: Well-developed, well-nourished, no acute distress. ?Eyes: Pink conjunctiva, anicteric sclera. ?HEENT: [place]

## 2021-07-30 ENCOUNTER — Encounter: Payer: Self-pay | Admitting: Oncology

## 2021-07-30 ENCOUNTER — Inpatient Hospital Stay: Payer: PPO

## 2021-07-30 VITALS — BP 139/72 | HR 70 | Temp 97.4°F | Resp 16

## 2021-07-30 DIAGNOSIS — C9200 Acute myeloblastic leukemia, not having achieved remission: Secondary | ICD-10-CM

## 2021-07-30 DIAGNOSIS — Z5111 Encounter for antineoplastic chemotherapy: Secondary | ICD-10-CM | POA: Diagnosis not present

## 2021-07-30 MED ORDER — ONDANSETRON HCL 4 MG PO TABS
8.0000 mg | ORAL_TABLET | Freq: Once | ORAL | Status: AC
  Administered 2021-07-30: 8 mg via ORAL
  Filled 2021-07-30: qty 2

## 2021-07-30 MED ORDER — AZACITIDINE CHEMO SQ INJECTION
50.0000 mg/m2 | Freq: Once | INTRAMUSCULAR | Status: AC
  Administered 2021-07-30: 87.5 mg via SUBCUTANEOUS
  Filled 2021-07-30: qty 3.5

## 2021-07-31 ENCOUNTER — Inpatient Hospital Stay: Payer: PPO

## 2021-07-31 VITALS — BP 134/64 | HR 68 | Temp 98.0°F | Resp 17

## 2021-07-31 DIAGNOSIS — Z5111 Encounter for antineoplastic chemotherapy: Secondary | ICD-10-CM | POA: Diagnosis not present

## 2021-07-31 DIAGNOSIS — C9200 Acute myeloblastic leukemia, not having achieved remission: Secondary | ICD-10-CM

## 2021-07-31 MED ORDER — AZACITIDINE CHEMO SQ INJECTION
50.0000 mg/m2 | Freq: Once | INTRAMUSCULAR | Status: AC
  Administered 2021-07-31: 87.5 mg via SUBCUTANEOUS
  Filled 2021-07-31: qty 3.5

## 2021-07-31 MED ORDER — ONDANSETRON HCL 4 MG PO TABS
8.0000 mg | ORAL_TABLET | Freq: Once | ORAL | Status: AC
  Administered 2021-07-31: 8 mg via ORAL
  Filled 2021-07-31: qty 2

## 2021-08-01 ENCOUNTER — Inpatient Hospital Stay: Payer: PPO

## 2021-08-01 VITALS — BP 147/70 | HR 68 | Temp 97.9°F

## 2021-08-01 DIAGNOSIS — C9200 Acute myeloblastic leukemia, not having achieved remission: Secondary | ICD-10-CM

## 2021-08-01 DIAGNOSIS — Z5111 Encounter for antineoplastic chemotherapy: Secondary | ICD-10-CM | POA: Diagnosis not present

## 2021-08-01 MED ORDER — ONDANSETRON HCL 4 MG PO TABS
8.0000 mg | ORAL_TABLET | Freq: Once | ORAL | Status: AC
  Administered 2021-08-01: 8 mg via ORAL
  Filled 2021-08-01: qty 2

## 2021-08-01 MED ORDER — AZACITIDINE CHEMO SQ INJECTION
50.0000 mg/m2 | Freq: Once | INTRAMUSCULAR | Status: AC
  Administered 2021-08-01: 87.5 mg via SUBCUTANEOUS
  Filled 2021-08-01: qty 3.5

## 2021-08-01 NOTE — Patient Instructions (Signed)
[facility] CANCER CTR AT [place]  Discharge Instructions: ?Thank you for choosing [facility] to provide your oncology and hematology care.  ?If you have a lab appointment with the [facility], please go directly to the [facility] and check in at the registration area. ? ?Wear comfortable clothing and clothing appropriate for easy access to any Portacath or PICC line.  ? ?We strive to give you quality time with your provider. You may need to reschedule your appointment if you arrive late (15 or more minutes).  Arriving late affects you and other patients whose appointments are after yours.  Also, if you miss three or more appointments without notifying the office, you may be dismissed from the clinic at the provider?s discretion.    ?  ?For prescription refill requests, have your pharmacy contact our office and allow 72 hours for refills to be completed.   ? ?Today you received the following chemotherapy and/or immunotherapy agents VIDAZA    ?  ?To help prevent nausea and vomiting after your treatment, we encourage you to take your nausea medication as directed. ? ?BELOW ARE SYMPTOMS THAT SHOULD BE REPORTED IMMEDIATELY: ?*FEVER GREATER THAN 100.4 F (38 ?C) OR HIGHER ?*CHILLS OR SWEATING ?*NAUSEA AND VOMITING THAT IS NOT CONTROLLED WITH YOUR NAUSEA MEDICATION ?*UNUSUAL SHORTNESS OF BREATH ?*UNUSUAL BRUISING OR BLEEDING ?*URINARY PROBLEMS (pain or burning when urinating, or frequent urination) ?*BOWEL PROBLEMS (unusual diarrhea, constipation, pain near the anus) ?TENDERNESS IN MOUTH AND THROAT WITH OR WITHOUT PRESENCE OF ULCERS (sore throat, sores in mouth, or a toothache) ?UNUSUAL RASH, SWELLING OR PAIN  ?UNUSUAL VAGINAL DISCHARGE OR ITCHING  ? ?Items with * indicate a potential emergency and should be followed up as soon as possible or go to the Emergency Department if any problems should occur. ? ?Please show the CHEMOTHERAPY ALERT CARD or IMMUNOTHERAPY ALERT CARD at check-in to the  Emergency Department and triage nurse. ? ?Should you have questions after your visit or need to cancel or reschedule your appointment, please contact [facility] CANCER [place] AT [place]  [phone] and follow the prompts.  Office hours are 8:00 a.m. to 4:30 p.m. Monday - Friday. Please note that voicemails left after 4:00 p.m. may not be returned until the following business day.  We are closed weekends and major holidays. You have access to a nurse at all times for urgent questions. Please call the main number to the clinic [phone] and follow the prompts. ? ?For any non-urgent questions, you may also contact your provider using MyChart. We now offer e-Visits for anyone 37 and older to request care online for non-urgent symptoms. For details visit [email or web address]. ?  ?Also download the MyChart app! Go to the app store, search "MyChart", open the app, select [place], and log in with your MyChart username and password. ? ?Due to Covid, a mask is required upon entering the hospital/clinic. If you do not have a mask, one will be given to you upon arrival. For doctor visits, patients may have 1 support person aged 50 or older with them. For treatment visits, patients cannot have anyone with them due to current Covid guidelines and our immunocompromised population.  ? ?Azacitidine suspension for injection (subcutaneous use) ?What is this medication? ?AZACITIDINE (ay [place]) is a chemotherapy drug. This medicine reduces the growth of cancer cells and can suppress the immune system. It is used for treating myelodysplastic syndrome or some types of leukemia. ?This medicine may be used for other purposes; ask  your health care provider or pharmacist if you have questions. ?COMMON BRAND NAME(S): Vidaza ?What should I tell my care team before I take this medication? ?They need to know if you have any of these conditions: ?kidney disease ?liver disease ?liver tumors ?an unusual or allergic  reaction to azacitidine, mannitol, other medicines, foods, dyes, or preservatives ?pregnant or trying to get pregnant ?breast-feeding ?How should I use this medication? ?This medicine is for injection under the skin. It is administered in a hospital or clinic by a specially trained health care professional. ?Talk to your pediatrician regarding the use of this medicine in children. While this drug may be prescribed for selected conditions, precautions do apply. ?Overdosage: If you think you have taken too much of this medicine contact a poison control center or emergency room at once. ?NOTE: This medicine is only for you. Do not share this medicine with others. ?What if I miss a dose? ?It is important not to miss your dose. Call your doctor or health care professional if you are unable to keep an appointment. ?What may interact with this medication? ?Interactions have not been studied. ?Give your health care provider a list of all the medicines, herbs, non-prescription drugs, or dietary supplements you use. Also tell them if you smoke, drink alcohol, or use illegal drugs. Some items may interact with your medicine. ?This list may not describe all possible interactions. Give your health care provider a list of all the medicines, herbs, non-prescription drugs, or dietary supplements you use. Also tell them if you smoke, drink alcohol, or use illegal drugs. Some items may interact with your medicine. ?What should I watch for while using this medication? ?Visit your doctor for checks on your progress. This drug may make you feel generally unwell. This is not uncommon, as chemotherapy can affect healthy cells as well as cancer cells. Report any side effects. Continue your course of treatment even though you feel ill unless your doctor tells you to stop. ?In some cases, you may be given additional medicines to help with side effects. Follow all directions for their use. ?Call your doctor or health care professional for  advice if you get a fever, chills or sore throat, or other symptoms of a cold or flu. Do not treat yourself. This drug decreases your body's ability to fight infections. Try to avoid being around people who are sick. ?This medicine may increase your risk to bruise or bleed. Call your doctor or health care professional if you notice any unusual bleeding. ?You may need blood work done while you are taking this medicine. ?Do not become pregnant while taking this medicine and for 6 months after the last dose. Women should inform their doctor if they wish to become pregnant or think they might be pregnant. Men should not father a child while taking this medicine and for 3 months after the last dose. There is a potential for serious side effects to an unborn child. Talk to your health care professional or pharmacist for more information. Do not breast-feed an infant while taking this medicine and for 1 week after the last dose. ?This medicine may interfere with the ability to have a child. Talk with your doctor or health care professional if you are concerned about your fertility. ?What side effects may I notice from receiving this medication? ?Side effects that you should report to your doctor or health care professional as soon as possible: ?allergic reactions like skin rash, itching or hives, swelling of the face, lips,  or tongue ?low blood counts - this medicine may decrease the number of white blood cells, red blood cells and platelets. You may be at increased risk for infections and bleeding. ?signs of infection - fever or chills, cough, sore throat, pain passing urine ?signs of decreased platelets or bleeding - bruising, pinpoint red spots on the skin, black, tarry stools, blood in the urine ?signs of decreased red blood cells - unusually weak or tired, fainting spells, lightheadedness ?signs and symptoms of kidney injury like trouble passing urine or change in the amount of urine ?signs and symptoms of liver  injury like dark yellow or brown urine; general ill feeling or flu-like symptoms; light-colored stools; loss of appetite; nausea; right upper belly pain; unusually weak or tired; yellowing of the eyes or skin ?Si

## 2021-08-11 ENCOUNTER — Other Ambulatory Visit: Payer: Self-pay | Admitting: Internal Medicine

## 2021-08-22 NOTE — Progress Notes (Signed)
?[facility]  ?[phone] [phone] ? ?ID: Debra Hale OB: 1950/10/08  MR#: [id number]  SNK#:[id number] ? ?Patient Care Team: ?[name], MD as PCP - General (Internal Medicine) ?Wellington Hampshire, MD as PCP - Cardiology (Cardiology) ? ?CHIEF COMPLAINT: AML. ? ?INTERVAL HISTORY: Patient returns to clinic today for further evaluation, continuation of Vidaza and adding back in venetoclax to her treatment regimen.  She is highly anxious, but otherwise feels well.  She continues to tolerate her treatments well without significant side effects. She has chronic weakness and fatigue.  She denies any pain.  She has no neurologic complaints.  She denies any recent fevers or illnesses. She has no chest pain, shortness of breath, cough, or hemoptysis.  She denies any nausea, vomiting, constipation, or diarrhea.  She has no urinary complaints.  Patient offers no further specific complaints today. ? ?REVIEW OF SYSTEMS:   ?Review of Systems  ?Constitutional:  Positive for malaise/fatigue. Negative for fever and weight loss.  ?Respiratory: Negative.  Negative for cough, hemoptysis and shortness of breath.   ?Cardiovascular: Negative.  Negative for chest pain and leg swelling.  ?Gastrointestinal:  Negative for abdominal pain and diarrhea.  ?Genitourinary: Negative.  Negative for dysuria.  ?Musculoskeletal: Negative.  Negative for back pain and falls.  ?Skin: Negative.  Negative for rash.  ?Neurological:  Positive for weakness. Negative for dizziness, focal weakness and headaches.  ?Psychiatric/Behavioral:  Negative for memory loss. The patient is nervous/anxious.   ? ?As per HPI. Otherwise, a complete review of systems is negative. ? ?PAST MEDICAL HISTORY: ?Past Medical History:  ?Diagnosis Date  ? Anxiety   ? Arthritis   ? Osteoarthritis  ? Breast cancer ([place]) 2009  ? right breast lumpectomy with rad tx  ? Colon cancer ([place])   ? surgery with chemo and rad tx  ? Complication of  anesthesia   ? GERD (gastroesophageal reflux disease)   ? Heart palpitations   ? History of hiatal hernia   ? History of kidney stones   ? Hypothyroidism   ? Liver disease   ? Liver nodule   ? s/p negative biopsy  ? Malignant neoplasm of thyroid gland ([place]) 2002  ? s/p surgery and XRT  ? Osteoporosis   ? Other and unspecified hyperlipidemia   ? Palpitations   ? Personal history of chemotherapy   ? Personal history of malignant neoplasm of large intestine   ? carcinoma - cecum, s/p right laparoscopic colectomy - s/p chemotherapy and XRT  ? Personal history of radiation therapy   ? Pneumonia 2019  ? PONV (postoperative nausea and vomiting)   ? Pure hypercholesterolemia   ? Unspecified hereditary and idiopathic peripheral neuropathy   ? ? ?PAST SURGICAL HISTORY: ?Past Surgical History:  ?Procedure Laterality Date  ? APPENDECTOMY  1985  ? BREAST BIOPSY Right 2009  ? positive  ? BREAST BIOPSY Right 2009  ? negative  ? BREAST LUMPECTOMY Right 2009  ? breast cancer  ? CHOLECYSTECTOMY  1995  ? COLONOSCOPY    ? COLONOSCOPY WITH PROPOFOL N/A 10/29/2017  ? Procedure: COLONOSCOPY WITH PROPOFOL;  Surgeon: Manya Silvas, MD;  Location: [facility] ENDOSCOPY;  Service: Endoscopy;  Laterality: N/A;  ? [place] OF UTERUS  1990  ? DILATION AND CURETTAGE, DIAGNOSTIC / THERAPEUTIC  1990  ? ESOPHAGOGASTRODUODENOSCOPY    ? ESOPHAGOGASTRODUODENOSCOPY (EGD) WITH PROPOFOL N/A 10/29/2017  ? Procedure: ESOPHAGOGASTRODUODENOSCOPY (EGD) WITH PROPOFOL;  Surgeon: Manya Silvas, MD;  Location: [facility] ENDOSCOPY;  Service: Endoscopy;  Laterality: N/A;  ? INCISION AND DRAINAGE PERIRECTAL ABSCESS N/A 06/17/2020  ? Procedure: IRRIGATION AND DEBRIDEMENT PERIRECTAL ABSCESS;  Surgeon: Jules Husbands, MD;  Location: [facility] ORS;  Service: General;  Laterality: N/A;  ? IR BONE MARROW BIOPSY & ASPIRATION  07/09/2021  ? LAPAROSCOPIC PARTIAL COLECTOMY    ? stage 3-C carcinoma of the cecum, s/p chemotherapy and xrt  ? LITHOTRIPSY    ? SIGMOIDOSCOPY   08/26/1993  ? THYROID LOBECTOMY  2002  ? s/p XRT  ? TOTAL HIP ARTHROPLASTY Right 10/24/2019  ? Procedure: TOTAL HIP ARTHROPLASTY;  Surgeon: Corky Mull, MD;  Location: [facility] ORS;  Service: Orthopedics;  Laterality: Right;  ? ? ?FAMILY HISTORY: ?Family History  ?Problem Relation Age of Onset  ? Stroke Mother   ?     21s  ? Alzheimer's disease Mother   ? Cancer Mother   ? Lung cancer Father   ? Prostate cancer Father   ? Cancer Father   ?     Colon  ? Colon cancer Father   ? Breast cancer Sister   ?     71's  ? Lung cancer Sister   ? Breast cancer Maternal Aunt   ? ? ?ADVANCED DIRECTIVES (Y/N):  N ? ?HEALTH MAINTENANCE: ?Social History  ? ?Tobacco Use  ? Smoking status: Never  ? Smokeless tobacco: Never  ?Vaping Use  ? Vaping Use: Never used  ?Substance Use Topics  ? Alcohol use: No  ?  Alcohol/week: 0.0 standard drinks  ? Drug use: No  ? ? ? Colonoscopy: ? PAP: ? Bone density: ? Lipid panel: ? ?Allergies  ?Allergen Reactions  ? Demeclocycline Other (See Comments)  ?  Throat swells  ? Sulfa Antibiotics Other (See Comments)  ?  Other reaction(s): Other (See Comments) ?Throat swells ?Other reaction(s): Other (See Comments) ?Throat swells  ? Tetracyclines & Related Other (See Comments)  ?  Throat swells ?  ? Augmentin [Amoxicillin-Pot Clavulanate] Diarrhea  ? Bentyl [Dicyclomine Hcl]   ?  unkn  ? Ciprofloxacin Diarrhea  ? Codeine Other (See Comments)  ?  dizziness ? ?  ? Dicyclomine Hcl Other (See Comments)  ?  unkn  ? Epinephrine   ?  Speeds heart up - panic   ? Flagyl [Metronidazole] Nausea And Vomiting  ? Librax [Chlordiazepoxide-Clidinium]   ?  unkn  ? Novocain [Procaine] Other (See Comments)  ?  "Shaky"  ? Phenobarbital   ?  unkn  ? Prednisone   ?  Nervous  ?  ? Ultram [Tramadol] Other (See Comments)  ?  Sick feeling  ? ? ?Current Outpatient Medications  ?Medication Sig Dispense Refill  ? Azelastine-Fluticasone 137-50 MCG/ACT SUSP SPRAY 2 SPRAYS INTO EACH NOSTRIL EVERY DAY 23 g 3  ? clonazePAM (KLONOPIN) 0.5 MG  tablet Take 1.5 mg by mouth 2 (two) times daily.   0  ? escitalopram (LEXAPRO) 20 MG tablet Take 20 mg by mouth every morning.     ? levothyroxine (SYNTHROID) 125 MCG tablet TAKE 1 TABLET EVERY DAY ON EMPTY STOMACHWITH A GLASS OF WATER AT LEAST 30-60 MINBEFORE BREAKFAST 90 tablet 1  ? loperamide (IMODIUM) 2 MG capsule Take by mouth.    ? pantoprazole (PROTONIX) 40 MG tablet TAKE ONE TABLET (40 MG) BY MOUTH EVERY DAY 90 tablet 2  ? potassium chloride SA (KLOR-CON M) 20 MEQ tablet TAKE ONE (1) TABLET BY MOUTH TWO TIMES PER DAY 60 tablet 0  ? valACYclovir (VALTREX) 500 MG tablet Take 1 tablet (500  mg total) by mouth daily. 90 tablet 3  ? venetoclax (VENCLEXTA) 100 MG tablet Take 4 tablets (400 mg total) by mouth daily. Take for 14 days, then hold for 14 days. Repeat every 28 days. Take with a meal and a full glass of water. 56 tablet 1  ? diphenoxylate-atropine (LOMOTIL) 2.5-0.025 MG tablet Take 1 tablet by mouth 4 (four) times daily as needed for diarrhea or loose stools. 60 tablet 1  ? metoprolol tartrate (LOPRESSOR) 25 MG tablet TAKE 1 TABLET (25 MG TOTAL) BY MOUTH AS NEEDED (AS NEEDED UP TO TWICE DAILY FOR PALPITATIONS). (Patient not taking: Reported on 07/29/2021) 60 tablet 1  ? ondansetron (ZOFRAN) 8 MG tablet Take 1 tablet (8 mg total) by mouth 2 (two) times daily as needed for nausea or vomiting. 20 tablet 0  ? oxyCODONE-acetaminophen (PERCOCET/ROXICET) 5-325 MG tablet Take 1 tablet by mouth every 6 (six) hours as needed for severe pain. (Patient not taking: Reported on 07/29/2021) 16 tablet 0  ? prochlorperazine (COMPAZINE) 10 MG tablet Take 1 tablet (10 mg total) by mouth every 6 (six) hours as needed for refractory nausea / vomiting. (Patient not taking: Reported on 07/29/2021) 30 tablet 0  ? ?Current Facility-Administered Medications  ?Medication Dose Route Frequency Provider Last Rate Last Admin  ? ondansetron (ZOFRAN) tablet 8 mg  8 mg Oral Once Jodelle Green, FNP      ? ?Facility-Administered Medications  Ordered in Other Visits  ?Medication Dose Route Frequency Provider Last Rate Last Admin  ? potassium chloride (KLOR-CON) CR tablet 40 mEq  40 mEq Oral BID Lloyd Huger, MD   40 mEq at 07/08/20 1136  ? ? ?OBJE

## 2021-08-25 ENCOUNTER — Telehealth: Payer: Self-pay | Admitting: Pharmacy Technician

## 2021-08-25 ENCOUNTER — Inpatient Hospital Stay: Payer: PPO

## 2021-08-25 ENCOUNTER — Encounter: Payer: Self-pay | Admitting: Oncology

## 2021-08-25 ENCOUNTER — Inpatient Hospital Stay: Payer: PPO | Attending: Oncology

## 2021-08-25 ENCOUNTER — Other Ambulatory Visit: Payer: Self-pay | Admitting: Pharmacist

## 2021-08-25 ENCOUNTER — Other Ambulatory Visit: Payer: Self-pay | Admitting: Emergency Medicine

## 2021-08-25 ENCOUNTER — Other Ambulatory Visit (HOSPITAL_COMMUNITY): Payer: Self-pay

## 2021-08-25 ENCOUNTER — Telehealth: Payer: Self-pay | Admitting: Pharmacist

## 2021-08-25 VITALS — BP 141/78 | HR 72 | Temp 97.4°F

## 2021-08-25 DIAGNOSIS — C9200 Acute myeloblastic leukemia, not having achieved remission: Secondary | ICD-10-CM

## 2021-08-25 DIAGNOSIS — R197 Diarrhea, unspecified: Secondary | ICD-10-CM | POA: Insufficient documentation

## 2021-08-25 DIAGNOSIS — Z853 Personal history of malignant neoplasm of breast: Secondary | ICD-10-CM | POA: Diagnosis not present

## 2021-08-25 DIAGNOSIS — E876 Hypokalemia: Secondary | ICD-10-CM | POA: Diagnosis not present

## 2021-08-25 DIAGNOSIS — Z5111 Encounter for antineoplastic chemotherapy: Secondary | ICD-10-CM | POA: Insufficient documentation

## 2021-08-25 DIAGNOSIS — D709 Neutropenia, unspecified: Secondary | ICD-10-CM | POA: Diagnosis not present

## 2021-08-25 DIAGNOSIS — Z85038 Personal history of other malignant neoplasm of large intestine: Secondary | ICD-10-CM | POA: Insufficient documentation

## 2021-08-25 LAB — COMPREHENSIVE METABOLIC PANEL
ALT: 17 U/L (ref 0–44)
AST: 14 U/L — ABNORMAL LOW (ref 15–41)
Albumin: 3.7 g/dL (ref 3.5–5.0)
Alkaline Phosphatase: 107 U/L (ref 38–126)
Anion gap: 4 — ABNORMAL LOW (ref 5–15)
BUN: 16 mg/dL (ref 8–23)
CO2: 27 mmol/L (ref 22–32)
Calcium: 8.5 mg/dL — ABNORMAL LOW (ref 8.9–10.3)
Chloride: 109 mmol/L (ref 98–111)
Creatinine, Ser: 0.96 mg/dL (ref 0.44–1.00)
GFR, Estimated: >60 mL/min (ref >60)
Glucose, Bld: 77 mg/dL (ref 70–99)
Potassium: 3.1 mmol/L — ABNORMAL LOW (ref 3.5–5.1)
Sodium: 140 mmol/L (ref 135–145)
Total Bilirubin: 0.5 mg/dL (ref 0.3–1.2)
Total Protein: 6.8 g/dL (ref 6.5–8.1)

## 2021-08-25 LAB — CBC WITH DIFFERENTIAL/PLATELET
Abs Immature Granulocytes: 0.01 10*3/uL (ref 0.00–0.07)
Basophils Absolute: 0 10*3/uL (ref 0.0–0.1)
Basophils Relative: 1 %
Eosinophils Absolute: 0 10*3/uL (ref 0.0–0.5)
Eosinophils Relative: 1 %
HCT: 37.6 % (ref 36.0–46.0)
Hemoglobin: 12.8 g/dL (ref 12.0–15.0)
Immature Granulocytes: 1 %
Lymphocytes Relative: 44 %
Lymphs Abs: 0.7 10*3/uL (ref 0.7–4.0)
MCH: 28.7 pg (ref 26.0–34.0)
MCHC: 34 g/dL (ref 30.0–36.0)
MCV: 84.3 fL (ref 80.0–100.0)
Monocytes Absolute: 0.1 10*3/uL (ref 0.1–1.0)
Monocytes Relative: 6 %
Neutro Abs: 0.8 10*3/uL — ABNORMAL LOW (ref 1.7–7.7)
Neutrophils Relative %: 47 %
Platelets: 153 10*3/uL (ref 150–400)
RBC: 4.46 MIL/uL (ref 3.87–5.11)
RDW: 16.4 % — ABNORMAL HIGH (ref 11.5–15.5)
WBC: 1.6 10*3/uL — ABNORMAL LOW (ref 4.0–10.5)
nRBC: 0 % (ref 0.0–0.2)

## 2021-08-25 MED ORDER — VENETOCLAX 100 MG PO TABS
400.0000 mg | ORAL_TABLET | Freq: Every day | ORAL | 1 refills | Status: DC
  Filled 2021-08-25 (×2): qty 56, 14d supply, fill #0
  Filled 2021-09-15: qty 56, 28d supply, fill #1

## 2021-08-25 MED ORDER — POTASSIUM CHLORIDE CRYS ER 20 MEQ PO TBCR: EXTENDED_RELEASE_TABLET | ORAL | 0 refills | Status: DC

## 2021-08-25 MED ORDER — AZACITIDINE CHEMO SQ INJECTION
50.0000 mg/m2 | Freq: Once | INTRAMUSCULAR | Status: AC
  Administered 2021-08-25: 87.5 mg via SUBCUTANEOUS
  Filled 2021-08-25: qty 3.5

## 2021-08-25 MED ORDER — ONDANSETRON HCL 4 MG PO TABS
8.0000 mg | ORAL_TABLET | Freq: Once | ORAL | Status: AC
  Administered 2021-08-25: 8 mg via ORAL
  Filled 2021-08-25: qty 2

## 2021-08-25 NOTE — Telephone Encounter (Signed)
Oral Oncology Patient Advocate Encounter ? ?Spoke to patient during infusion today to set up delivery of Venclexta.  Shipment is scheduled to be delivered on 08/26/21. Address updated in WAM to significant others address for safer delivery. ? ?Dennison Nancy CPHT ?Specialty Pharmacy Patient Advocate ?[place] ?Phone [phone] ?Fax [phone] ?08/25/2021 2:40 PM ? ?

## 2021-08-25 NOTE — Patient Instructions (Signed)
[facility]  Discharge Instructions: ?Thank you for choosing [facility] to provide your oncology and hematology care.  ?If you have a lab appointment with the [facility], please go directly to the [facility] and check in at the registration area. ? ?Wear comfortable clothing and clothing appropriate for easy access to any Portacath or PICC line.  ? ?We strive to give you quality time with your provider. You may need to reschedule your appointment if you arrive late (15 or more minutes).  Arriving late affects you and other patients whose appointments are after yours.  Also, if you miss three or more appointments without notifying the office, you may be dismissed from the clinic at the provider?s discretion.    ?  ?For prescription refill requests, have your pharmacy contact our office and allow 72 hours for refills to be completed.   ? ?Today you received the following chemotherapy and/or immunotherapy agents VIDAZA    ?  ?To help prevent nausea and vomiting after your treatment, we encourage you to take your nausea medication as directed. ? ?BELOW ARE SYMPTOMS THAT SHOULD BE REPORTED IMMEDIATELY: ?*FEVER GREATER THAN 100.4 F (38 ?C) OR HIGHER ?*CHILLS OR SWEATING ?*NAUSEA AND VOMITING THAT IS NOT CONTROLLED WITH YOUR NAUSEA MEDICATION ?*UNUSUAL SHORTNESS OF BREATH ?*UNUSUAL BRUISING OR BLEEDING ?*URINARY PROBLEMS (pain or burning when urinating, or frequent urination) ?*BOWEL PROBLEMS (unusual diarrhea, constipation, pain near the anus) ?TENDERNESS IN MOUTH AND THROAT WITH OR WITHOUT PRESENCE OF ULCERS (sore throat, sores in mouth, or a toothache) ?UNUSUAL RASH, SWELLING OR PAIN  ?UNUSUAL VAGINAL DISCHARGE OR ITCHING  ? ?Items with * indicate a potential emergency and should be followed up as soon as possible or go to the Emergency Department if any problems should occur. ? ?Please show the CHEMOTHERAPY ALERT CARD or IMMUNOTHERAPY ALERT CARD at check-in to the  Emergency Department and triage nurse. ? ?Should you have questions after your visit or need to cancel or reschedule your appointment, please contact [facility]CER [place] [facility] [place]  [phone] and follow the prompts.  Office hours are 8:00 a.m. to 4:30 p.m. Monday - Friday. Please note that voicemails left after 4:00 p.m. may not be returned until the following business day.  We are closed weekends and major holidays. You have access to a nurse at all times for urgent questions. Please call the main number to the clinic [phone] and follow the prompts. ? ?For any non-urgent questions, you may also contact your provider using MyChart. We now offer e-Visits for anyone 17 and older to request care online for non-urgent symptoms. For details visit [email or web address]. ?  ?Also download the MyChart app! Go to the app store, search "MyChart", open the app, select [facility], and log in with your MyChart username and password. ? ?Due to Covid, a mask is required upon entering the hospital/clinic. If you do not have a mask, one will be given to you upon arrival. For doctor visits, patients may have 1 support person aged 88 or older with them. For treatment visits, patients cannot have anyone with them due to current Covid guidelines and our immunocompromised population.  ? ?Azacitidine suspension for injection (subcutaneous use) ?What is this medication? ?AZACITIDINE (ay Martorell) is a chemotherapy drug. This medicine reduces the growth of cancer cells and can suppress the immune system. It is used for treating myelodysplastic syndrome or some types of leukemia. ?This medicine may be used for other purposes; ask  your health care provider or pharmacist if you have questions. ?COMMON BRAND NAME(S): Vidaza ?What should I tell my care team before I take this medication? ?They need to know if you have any of these conditions: ?kidney disease ?liver disease ?liver tumors ?an unusual or allergic  reaction to azacitidine, mannitol, other medicines, foods, dyes, or preservatives ?pregnant or trying to get pregnant ?breast-feeding ?How should I use this medication? ?This medicine is for injection under the skin. It is administered in a hospital or clinic by a specially trained health care professional. ?Talk to your pediatrician regarding the use of this medicine in children. While this drug may be prescribed for selected conditions, precautions do apply. ?Overdosage: If you think you have taken too much of this medicine contact a poison control center or emergency room at once. ?NOTE: This medicine is only for you. Do not share this medicine with others. ?What if I miss a dose? ?It is important not to miss your dose. Call your doctor or health care professional if you are unable to keep an appointment. ?What may interact with this medication? ?Interactions have not been studied. ?Give your health care provider a list of all the medicines, herbs, non-prescription drugs, or dietary supplements you use. Also tell them if you smoke, drink alcohol, or use illegal drugs. Some items may interact with your medicine. ?This list may not describe all possible interactions. Give your health care provider a list of all the medicines, herbs, non-prescription drugs, or dietary supplements you use. Also tell them if you smoke, drink alcohol, or use illegal drugs. Some items may interact with your medicine. ?What should I watch for while using this medication? ?Visit your doctor for checks on your progress. This drug may make you feel generally unwell. This is not uncommon, as chemotherapy can affect healthy cells as well as cancer cells. Report any side effects. Continue your course of treatment even though you feel ill unless your doctor tells you to stop. ?In some cases, you may be given additional medicines to help with side effects. Follow all directions for their use. ?Call your doctor or health care professional for  advice if you get a fever, chills or sore throat, or other symptoms of a cold or flu. Do not treat yourself. This drug decreases your body's ability to fight infections. Try to avoid being around people who are sick. ?This medicine may increase your risk to bruise or bleed. Call your doctor or health care professional if you notice any unusual bleeding. ?You may need blood work done while you are taking this medicine. ?Do not become pregnant while taking this medicine and for 6 months after the last dose. Women should inform their doctor if they wish to become pregnant or think they might be pregnant. Men should not father a child while taking this medicine and for 3 months after the last dose. There is a potential for serious side effects to an unborn child. Talk to your health care professional or pharmacist for more information. Do not breast-feed an infant while taking this medicine and for 1 week after the last dose. ?This medicine may interfere with the ability to have a child. Talk with your doctor or health care professional if you are concerned about your fertility. ?What side effects may I notice from receiving this medication? ?Side effects that you should report to your doctor or health care professional as soon as possible: ?allergic reactions like skin rash, itching or hives, swelling of the face, lips,  or tongue ?low blood counts - this medicine may decrease the number of white blood cells, red blood cells and platelets. You may be at increased risk for infections and bleeding. ?signs of infection - fever or chills, cough, sore throat, pain passing urine ?signs of decreased platelets or bleeding - bruising, pinpoint red spots on the skin, black, tarry stools, blood in the urine ?signs of decreased red blood cells - unusually weak or tired, fainting spells, lightheadedness ?signs and symptoms of kidney injury like trouble passing urine or change in the amount of urine ?signs and symptoms of liver  injury like dark yellow or brown urine; general ill feeling or flu-like symptoms; light-colored stools; loss of appetite; nausea; right upper belly pain; unusually weak or tired; yellowing of the eyes or skin ?Si

## 2021-08-25 NOTE — Telephone Encounter (Signed)
Oral Oncology Patient Advocate Encounter ? ?After completing a benefits investigation, prior authorization for Venclexta is not required at this time through [employer] ([employer]). ? ?Patient's copay is $2029.96. Obtained patient a grant through the [facility] to cover copays up to $10,000.  Fatima Sanger info documented in separate encounter. ? ?Dennison Nancy CPHT ?Specialty Pharmacy Patient Advocate ?[place] ?Phone [phone] ?Fax [phone] ?08/25/2021 2:37 PM ?   ? ? ?  ? ?

## 2021-08-25 NOTE — Telephone Encounter (Signed)
Oral Oncology Patient Advocate Encounter ? ?Was successful in securing patient a $10,000 grant from [employer] to provide copayment coverage for [employer].  This will keep the out of pocket expense at $0.   ?  ?[employer] ID: [id number] ? ?I have spoken with the patient. ?  ?The billing information is as follows and has been shared with [facility].  ?  ?RxBin: [id number] ?PCN: [id number] ?Member ID: [id number] ?Group ID: [id number] ?Dates of Eligibility: 07/26/21 through 07/26/22 ? ?Fund:  AML ? ?Dennison Nancy CPHT ?Specialty Pharmacy Patient Advocate ?[place] ?Phone [phone] ?Fax [phone] ?08/25/2021 2:42 PM ?  ? ? ?

## 2021-08-25 NOTE — Telephone Encounter (Addendum)
Oral Oncology Pharmacy Student Encounter ? ?Received new prescription for venetoclax (Venclexta) for the treatment of AML in conjunction with azacitidine, planned duration until disease progression or unacceptable toxicity. ? ?CMP/CBC from 07/28/21 assessed, mild thrombocytopenia and neutropenia noted. Patient is a candidate for venetoclax given low risk for tumor lysis syndrome that WBC<25,000/mm3. All other relevant labs wnl, will keep close monitoring. Prescription dose and frequency assessed.  ? ?Current medication list in Epic reviewed, no relevant DDI was identified. ? ?Evaluated chart and no patient barriers to medication adherence identified.  ? ?Prescription has been e-scribed to the [facility] for benefits analysis and approval. ? ?Oral Oncology Clinic will continue to follow for insurance authorization, copayment issues, initial counseling and start date. ? ?Patient agreed to treatment on 07/29/21 per MD documentation. ? ?Adele Dan, PharmD Candidate ?Class of 2023 ?[facility] Oral Chemotherapy Navigation [facility] ?[phone] ? ?08/25/2021 9:55 AM  ?

## 2021-08-26 ENCOUNTER — Other Ambulatory Visit: Payer: Self-pay | Admitting: Pharmacist

## 2021-08-26 ENCOUNTER — Other Ambulatory Visit (HOSPITAL_COMMUNITY): Payer: Self-pay

## 2021-08-26 ENCOUNTER — Inpatient Hospital Stay (HOSPITAL_BASED_OUTPATIENT_CLINIC_OR_DEPARTMENT_OTHER): Payer: PPO | Admitting: Oncology

## 2021-08-26 ENCOUNTER — Inpatient Hospital Stay: Payer: PPO

## 2021-08-26 ENCOUNTER — Inpatient Hospital Stay: Payer: PPO | Admitting: Pharmacist

## 2021-08-26 VITALS — BP 133/79 | HR 79 | Temp 97.5°F | Resp 18 | Ht 64.0 in | Wt 129.6 lb

## 2021-08-26 DIAGNOSIS — C9200 Acute myeloblastic leukemia, not having achieved remission: Secondary | ICD-10-CM

## 2021-08-26 DIAGNOSIS — Z5111 Encounter for antineoplastic chemotherapy: Secondary | ICD-10-CM | POA: Diagnosis not present

## 2021-08-26 MED ORDER — ONDANSETRON HCL 4 MG PO TABS
8.0000 mg | ORAL_TABLET | Freq: Once | ORAL | Status: AC
  Administered 2021-08-26: 8 mg via ORAL
  Filled 2021-08-26: qty 2

## 2021-08-26 MED ORDER — AZACITIDINE CHEMO SQ INJECTION
50.0000 mg/m2 | Freq: Once | INTRAMUSCULAR | Status: AC
  Administered 2021-08-26: 87.5 mg via SUBCUTANEOUS
  Filled 2021-08-26: qty 3.5

## 2021-08-26 MED ORDER — ONDANSETRON HCL 8 MG PO TABS: 8.0000 mg | ORAL_TABLET | Freq: Two times a day (BID) | ORAL | 0 refills | Status: DC | PRN

## 2021-08-26 MED ORDER — DIPHENOXYLATE-ATROPINE 2.5-0.025 MG PO TABS: 1.0000 | ORAL_TABLET | Freq: Four times a day (QID) | ORAL | 1 refills | Status: DC | PRN

## 2021-08-26 NOTE — Progress Notes (Signed)
Per Debra Hale ok to give Vidaza with 24 hours of last shot ?

## 2021-08-26 NOTE — Progress Notes (Signed)
? ?O[facility]otherapy [facility] ?[place]  ?[phone] [phone] ? ?Patient Care Team: ?[name], MD as PCP - General (Internal Medicine) ?Wellington Hampshire, MD as PCP - Cardiology (Cardiology)  ? ?Name of the patient: Debra Hale  ?[id number]  ?19-Jul-1950  ? ?Date of visit: 08/26/21 ? ?HPI: Patient is a 71 y.o. 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 Most recent bone marrow biopsy on 07/09/21 revealed an increased blast count to 5%. Reinitiating venetoclax 14 on/14 off starting cycle 15 today 08/26/21. ? ?Reason for Consult: venetoclax oral chemotherapy education. ? ? ?PAST MEDICAL HISTORY: ?Past Medical History:  ?Diagnosis Date  ? Anxiety   ? Arthritis   ? Osteoarthritis  ? Breast cancer ([place]) 2009  ? right breast lumpectomy with rad tx  ? Colon cancer ([place])   ? surgery with chemo and rad tx  ? Complication of anesthesia   ? GERD (gastroesophageal reflux disease)   ? Heart palpitations   ? History of hiatal hernia   ? History of kidney stones   ? Hypothyroidism   ? Liver disease   ? Liver nodule   ? s/p negative biopsy  ? Malignant neoplasm of thyroid gland ([place]) 2002  ? s/p surgery and XRT  ? Osteoporosis   ? Other and unspecified hyperlipidemia   ? Palpitations   ? Personal history of chemotherapy   ? Personal history of malignant neoplasm of large intestine   ? carcinoma - cecum, s/p right laparoscopic colectomy - s/p chemotherapy and XRT  ? Personal history of radiation therapy   ? Pneumonia 2019  ? PONV (postoperative nausea and vomiting)   ? Pure hypercholesterolemia   ? Unspecified hereditary and idiopathic peripheral neuropathy   ? ? ?HEMATOLOGY/ONCOLOGY HISTORY:  ?Oncology History  ?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[place])  ?04/12/2020 Initial Diagnosis  ? 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[place]) ? ?  ?06/10/2020 -  Chemotherapy  ? Patient is on Treatment Plan : AML dose reduced azacitidine SQ D1-5 q28d  ? ?  ?  ? ? ?ALLERGIES:  is allergic to  demeclocycline, sulfa antibiotics, tetracyclines & related, augmentin [amoxicillin-pot clavulanate], bentyl [dicyclomine hcl], ciprofloxacin, codeine, dicyclomine hcl, epinephrine, flagyl [metronidazole], librax [chlordiazepoxide-clidinium], novocain [procaine], phenobarbital, prednisone, and ultram [tramadol]. ? ?MEDICATIONS:  ?Current Outpatient Medications  ?Medication Sig Dispense Refill  ? Azelastine-Fluticasone 137-50 MCG/ACT SUSP SPRAY 2 SPRAYS INTO EACH NOSTRIL EVERY DAY 23 g 3  ? clonazePAM (KLONOPIN) 0.5 MG tablet Take 1.5 mg by mouth 2 (two) times daily.   0  ? diphenoxylate-atropine (LOMOTIL) 2.5-0.025 MG tablet Take 1 tablet by mouth 4 (four) times daily as needed for diarrhea or loose stools. 120 tablet 1  ? escitalopram (LEXAPRO) 20 MG tablet Take 20 mg by mouth every morning.     ? levothyroxine (SYNTHROID) 125 MCG tablet TAKE 1 TABLET EVERY DAY ON EMPTY STOMACHWITH A GLASS OF WATER AT LEAST 30-60 MINBEFORE BREAKFAST 90 tablet 1  ? loperamide (IMODIUM) 2 MG capsule Take by mouth.    ? metoprolol tartrate (LOPRESSOR) 25 MG tablet TAKE 1 TABLET (25 MG TOTAL) BY MOUTH AS NEEDED (AS NEEDED UP TO TWICE DAILY FOR PALPITATIONS). (Patient not taking: Reported on 07/29/2021) 60 tablet 1  ? oxyCODONE-acetaminophen (PERCOCET/ROXICET) 5-325 MG tablet Take 1 tablet by mouth every 6 (six) hours as needed for severe pain. (Patient not taking: Reported on 07/29/2021) 16 tablet 0  ? pantoprazole (PROTONIX) 40 MG tablet TAKE ONE TABLET (40 MG) BY MOUTH EVERY DAY 90 tablet 2  ? potassium  chloride SA (KLOR-CON M) 20 MEQ tablet TAKE ONE (1) TABLET BY MOUTH TWO TIMES PER DAY 60 tablet 0  ? prochlorperazine (COMPAZINE) 10 MG tablet Take 1 tablet (10 mg total) by mouth every 6 (six) hours as needed for refractory nausea / vomiting. (Patient not taking: Reported on 07/29/2021) 30 tablet 0  ? valACYclovir (VALTREX) 500 MG tablet Take 1 tablet (500 mg total) by mouth daily. 90 tablet 3  ? venetoclax (VENCLEXTA) 100 MG tablet Take  4 tablets (400 mg total) by mouth daily. Take for 14 days, then hold for 14 days. Repeat every 28 days. Take with a meal and a full glass of water. 56 tablet 1  ? ?Current Facility-Administered Medications  ?Medication Dose Route Frequency Provider Last Rate Last Admin  ? ondansetron (ZOFRAN) tablet 8 mg  8 mg Oral Once Jodelle Green, FNP      ? ?Facility-Administered Medications Ordered in Other Visits  ?Medication Dose Route Frequency Provider Last Rate Last Admin  ? azaCITIDine (VIDAZA) chemo injection 87.5 mg  50 mg/m2 (Treatment Plan Recorded) Subcutaneous Once Lloyd Huger, MD      ? ondansetron [facility]) tablet 8 mg  8 mg Oral Once Lloyd Huger, MD      ? potassium chloride (KLOR-CON) CR tablet 40 mEq  40 mEq Oral BID Lloyd Huger, MD   40 mEq at 07/08/20 1136  ? ? ?VITAL SIGNS: ?There were no vitals taken for this visit. ?There were no vitals filed for this visit.  ?Estimated body mass index is 22.25 kg/m? as calculated from the following: ?  Height as of an earlier encounter on 08/26/21: 5' 4"  (1.626 m). ?  Weight as of an earlier encounter on 08/26/21: 58.8 kg (129 lb 9.6 oz). ? ?LABS: ?CBC: ?   ?Component Value Date/Time  ? WBC 1.6 (L) 08/25/2021 1400  ? HGB 12.8 08/25/2021 1400  ? HGB 12.5 05/09/2018 1147  ? HCT 37.6 08/25/2021 1400  ? HCT 36.8 05/09/2018 1147  ? PLT 153 08/25/2021 1400  ? PLT 285 05/09/2018 1147  ? MCV 84.3 08/25/2021 1400  ? MCV 86 05/09/2018 1147  ? MCV 89 11/30/2013 1419  ? NEUTROABS 0.8 (L) 08/25/2021 1400  ? NEUTROABS 2.9 11/20/2013 1404  ? LYMPHSABS 0.7 08/25/2021 1400  ? LYMPHSABS 0.9 (L) 11/20/2013 1404  ? MONOABS 0.1 08/25/2021 1400  ? MONOABS 0.2 11/20/2013 1404  ? EOSABS 0.0 08/25/2021 1400  ? EOSABS 0.1 11/20/2013 1404  ? BASOSABS 0.0 08/25/2021 1400  ? BASOSABS 0.0 11/20/2013 1404  ? ?Comprehensive Metabolic Panel: ?   ?Component Value Date/Time  ? NA 140 08/25/2021 1400  ? NA 143 05/09/2018 1147  ? NA 145 11/30/2013 1419  ? K 3.1 (L) 08/25/2021 1400  ? K  3.5 11/30/2013 1419  ? CL 109 08/25/2021 1400  ? CL 109 (H) 11/30/2013 1419  ? CO2 27 08/25/2021 1400  ? CO2 29 11/30/2013 1419  ? BUN 16 08/25/2021 1400  ? BUN 11 05/09/2018 1147  ? BUN 15 11/30/2013 1419  ? CREATININE 0.96 08/25/2021 1400  ? CREATININE 1.04 11/30/2013 1419  ? GLUCOSE 77 08/25/2021 1400  ? GLUCOSE 97 11/30/2013 1419  ? CALCIUM 8.5 (L) 08/25/2021 1400  ? CALCIUM 8.7 11/30/2013 1419  ? AST 14 (L) 08/25/2021 1400  ? AST 28 11/30/2013 1419  ? ALT 17 08/25/2021 1400  ? ALT 71 (H) 11/30/2013 1419  ? ALKPHOS 107 08/25/2021 1400  ? ALKPHOS 114 11/30/2013 1419  ? BILITOT 0.5  08/25/2021 1400  ? BILITOT 0.7 11/30/2013 1419  ? PROT 6.8 08/25/2021 1400  ? PROT 7.1 11/30/2013 1419  ? ALBUMIN 3.7 08/25/2021 1400  ? ALBUMIN 3.3 (L) 11/30/2013 1419  ? ? ? ?Present during today's visit: patient and patient's friend Butch Penny ? ?Start plan: venetoclax 14 on/14 off until disease progression or unacceptable toxicity. ?  ?Patient Education ?I spoke with patient for overview of new oral chemotherapy medication: venetoclax (Venclexta)  ? ?Administration: ?Counseled patient on administration, dosing, side effects, monitoring, drug-food interactions, safe handling, storage, and disposal. ?Patient will take 4 tablets (400 mg total) by mouth daily. Take for 14 days, then hold for 14 days. Repeat every 28 days. Take with a meal and a full glass of water. ? ?Side Effects: ?Side effects include but not limited to: diarrhea, nausea, vomiting, fatigue, decrease WBC/HGB/PLT. ?Diarrhea: patient has baseline diarrhea and reported having 5 episodes yesterday. Educated patient to take loperamide and Lomotil as needed, and contact office if diarrhea gets worse.   ?Denied any other side effects with previous venetoclax use. ? ?Drug-drug Interactions (DDI): ?No relevant DDI noted. ? ?Adherence: ?After discussion with patient no patient barriers to medication adherence identified.  ?Reviewed with patient importance of keeping a medication  schedule and plan for any missed doses. ? ?Ms. Krauser voiced understanding and appreciation. All questions answered. Medication handout and calendar provided. ? ?Provided patient with Oral Chemotherapy Navigation Cl

## 2021-08-26 NOTE — Progress Notes (Signed)
Pt reports diarrhea last night.  ?

## 2021-08-27 ENCOUNTER — Other Ambulatory Visit (HOSPITAL_COMMUNITY): Payer: Self-pay

## 2021-08-27 ENCOUNTER — Inpatient Hospital Stay: Payer: PPO

## 2021-08-27 VITALS — BP 139/64 | Temp 98.4°F | Resp 18

## 2021-08-27 DIAGNOSIS — Z5111 Encounter for antineoplastic chemotherapy: Secondary | ICD-10-CM | POA: Diagnosis not present

## 2021-08-27 DIAGNOSIS — C9200 Acute myeloblastic leukemia, not having achieved remission: Secondary | ICD-10-CM

## 2021-08-27 MED ORDER — ONDANSETRON HCL 4 MG PO TABS
8.0000 mg | ORAL_TABLET | Freq: Once | ORAL | Status: AC
  Administered 2021-08-27: 8 mg via ORAL
  Filled 2021-08-27: qty 2

## 2021-08-27 MED ORDER — AZACITIDINE CHEMO SQ INJECTION
50.0000 mg/m2 | Freq: Once | INTRAMUSCULAR | Status: AC
  Administered 2021-08-27: 87.5 mg via SUBCUTANEOUS
  Filled 2021-08-27: qty 3.5

## 2021-08-27 NOTE — Patient Instructions (Signed)
[facility] CANCER CTR AT [place]  Discharge Instructions: ?Thank you for choosing [facility] to provide your oncology and hematology care.  ?If you have a lab appointment with the [facility], please go directly to the [facility] and check in at the registration area. ? ?Wear comfortable clothing and clothing appropriate for easy access to any Portacath or PICC line.  ? ?We strive to give you quality time with your provider. You may need to reschedule your appointment if you arrive late (15 or more minutes).  Arriving late affects you and other patients whose appointments are after yours.  Also, if you miss three or more appointments without notifying the office, you may be dismissed from the clinic at the provider?s discretion.    ?  ?For prescription refill requests, have your pharmacy contact our office and allow 72 hours for refills to be completed.   ? ?Today you received the following chemotherapy and/or immunotherapy agents VIDAZA    ?  ?To help prevent nausea and vomiting after your treatment, we encourage you to take your nausea medication as directed. ? ?BELOW ARE SYMPTOMS THAT SHOULD BE REPORTED IMMEDIATELY: ?*FEVER GREATER THAN 100.4 F (38 ?C) OR HIGHER ?*CHILLS OR SWEATING ?*NAUSEA AND VOMITING THAT IS NOT CONTROLLED WITH YOUR NAUSEA MEDICATION ?*UNUSUAL SHORTNESS OF BREATH ?*UNUSUAL BRUISING OR BLEEDING ?*URINARY PROBLEMS (pain or burning when urinating, or frequent urination) ?*BOWEL PROBLEMS (unusual diarrhea, constipation, pain near the anus) ?TENDERNESS IN MOUTH AND THROAT WITH OR WITHOUT PRESENCE OF ULCERS (sore throat, sores in mouth, or a toothache) ?UNUSUAL RASH, SWELLING OR PAIN  ?UNUSUAL VAGINAL DISCHARGE OR ITCHING  ? ?Items with * indicate a potential emergency and should be followed up as soon as possible or go to the Emergency Department if any problems should occur. ? ?Please show the CHEMOTHERAPY ALERT CARD or IMMUNOTHERAPY ALERT CARD at check-in to the  Emergency Department and triage nurse. ? ?Should you have questions after your visit or need to cancel or reschedule your appointment, please contact [facility] CANCER [place] AT [place]  [phone] and follow the prompts.  Office hours are 8:00 a.m. to 4:30 p.m. Monday - Friday. Please note that voicemails left after 4:00 p.m. may not be returned until the following business day.  We are closed weekends and major holidays. You have access to a nurse at all times for urgent questions. Please call the main number to the clinic [phone] and follow the prompts. ? ?For any non-urgent questions, you may also contact your provider using MyChart. We now offer e-Visits for anyone 24 and older to request care online for non-urgent symptoms. For details visit [email or web address]. ?  ?Also download the MyChart app! Go to the app store, search "MyChart", open the app, select [facility], and log in with your MyChart username and password. ? ?Due to Covid, a mask is required upon entering the hospital/clinic. If you do not have a mask, one will be given to you upon arrival. For doctor visits, patients may have 1 support person aged 78 or older with them. For treatment visits, patients cannot have anyone with them due to current Covid guidelines and our immunocompromised population.  ? ?Azacitidine suspension for injection (subcutaneous use) ?What is this medication? ?AZACITIDINE (ay Pamplico) is a chemotherapy drug. This medicine reduces the growth of cancer cells and can suppress the immune system. It is used for treating myelodysplastic syndrome or some types of leukemia. ?This medicine may be used for other purposes; ask  your health care provider or pharmacist if you have questions. ?COMMON BRAND NAME(S): Vidaza ?What should I tell my care team before I take this medication? ?They need to know if you have any of these conditions: ?kidney disease ?liver disease ?liver tumors ?an unusual or allergic  reaction to azacitidine, mannitol, other medicines, foods, dyes, or preservatives ?pregnant or trying to get pregnant ?breast-feeding ?How should I use this medication? ?This medicine is for injection under the skin. It is administered in a hospital or clinic by a specially trained health care professional. ?Talk to your pediatrician regarding the use of this medicine in children. While this drug may be prescribed for selected conditions, precautions do apply. ?Overdosage: If you think you have taken too much of this medicine contact a poison control center or emergency room at once. ?NOTE: This medicine is only for you. Do not share this medicine with others. ?What if I miss a dose? ?It is important not to miss your dose. Call your doctor or health care professional if you are unable to keep an appointment. ?What may interact with this medication? ?Interactions have not been studied. ?Give your health care provider a list of all the medicines, herbs, non-prescription drugs, or dietary supplements you use. Also tell them if you smoke, drink alcohol, or use illegal drugs. Some items may interact with your medicine. ?This list may not describe all possible interactions. Give your health care provider a list of all the medicines, herbs, non-prescription drugs, or dietary supplements you use. Also tell them if you smoke, drink alcohol, or use illegal drugs. Some items may interact with your medicine. ?What should I watch for while using this medication? ?Visit your doctor for checks on your progress. This drug may make you feel generally unwell. This is not uncommon, as chemotherapy can affect healthy cells as well as cancer cells. Report any side effects. Continue your course of treatment even though you feel ill unless your doctor tells you to stop. ?In some cases, you may be given additional medicines to help with side effects. Follow all directions for their use. ?Call your doctor or health care professional for  advice if you get a fever, chills or sore throat, or other symptoms of a cold or flu. Do not treat yourself. This drug decreases your body's ability to fight infections. Try to avoid being around people who are sick. ?This medicine may increase your risk to bruise or bleed. Call your doctor or health care professional if you notice any unusual bleeding. ?You may need blood work done while you are taking this medicine. ?Do not become pregnant while taking this medicine and for 6 months after the last dose. Women should inform their doctor if they wish to become pregnant or think they might be pregnant. Men should not father a child while taking this medicine and for 3 months after the last dose. There is a potential for serious side effects to an unborn child. Talk to your health care professional or pharmacist for more information. Do not breast-feed an infant while taking this medicine and for 1 week after the last dose. ?This medicine may interfere with the ability to have a child. Talk with your doctor or health care professional if you are concerned about your fertility. ?What side effects may I notice from receiving this medication? ?Side effects that you should report to your doctor or health care professional as soon as possible: ?allergic reactions like skin rash, itching or hives, swelling of the face, lips,  or tongue ?low blood counts - this medicine may decrease the number of white blood cells, red blood cells and platelets. You may be at increased risk for infections and bleeding. ?signs of infection - fever or chills, cough, sore throat, pain passing urine ?signs of decreased platelets or bleeding - bruising, pinpoint red spots on the skin, black, tarry stools, blood in the urine ?signs of decreased red blood cells - unusually weak or tired, fainting spells, lightheadedness ?signs and symptoms of kidney injury like trouble passing urine or change in the amount of urine ?signs and symptoms of liver  injury like dark yellow or brown urine; general ill feeling or flu-like symptoms; light-colored stools; loss of appetite; nausea; right upper belly pain; unusually weak or tired; yellowing of the eyes or skin ?Si

## 2021-08-28 ENCOUNTER — Inpatient Hospital Stay: Payer: PPO

## 2021-08-28 VITALS — BP 140/62 | HR 69 | Temp 96.1°F | Resp 20

## 2021-08-28 DIAGNOSIS — Z5111 Encounter for antineoplastic chemotherapy: Secondary | ICD-10-CM | POA: Diagnosis not present

## 2021-08-28 DIAGNOSIS — C9200 Acute myeloblastic leukemia, not having achieved remission: Secondary | ICD-10-CM

## 2021-08-28 MED ORDER — AZACITIDINE CHEMO SQ INJECTION
50.0000 mg/m2 | Freq: Once | INTRAMUSCULAR | Status: AC
  Administered 2021-08-28: 87.5 mg via SUBCUTANEOUS
  Filled 2021-08-28: qty 3.5

## 2021-08-28 MED ORDER — ONDANSETRON HCL 4 MG PO TABS
8.0000 mg | ORAL_TABLET | Freq: Once | ORAL | Status: AC
  Administered 2021-08-28: 8 mg via ORAL
  Filled 2021-08-28: qty 2

## 2021-08-28 NOTE — Patient Instructions (Signed)
[facility] AT [place]  Discharge Instructions: ?Thank you for choosing [facility] to provide your oncology and hematology care.  ?If you have a lab appointment with the [facility], please go directly to the [facility] and check in at the registration area. ? ?Wear comfortable clothing and clothing appropriate for easy access to any Portacath or PICC line.  ? ?We strive to give you quality time with your provider. You may need to reschedule your appointment if you arrive late (15 or more minutes).  Arriving late affects you and other patients whose appointments are after yours.  Also, if you miss three or more appointments without notifying the office, you may be dismissed from the clinic at the provider?s discretion.    ?  ?For prescription refill requests, have your pharmacy contact our office and allow 72 hours for refills to be completed.   ? ?Today you received the following chemotherapy and/or immunotherapy agents: Vidaza    ?  ?To help prevent nausea and vomiting after your treatment, we encourage you to take your nausea medication as directed. ? ?BELOW ARE SYMPTOMS THAT SHOULD BE REPORTED IMMEDIATELY: ?*FEVER GREATER THAN 100.4 F (38 ?C) OR HIGHER ?*CHILLS OR SWEATING ?*NAUSEA AND VOMITING THAT IS NOT CONTROLLED WITH YOUR NAUSEA MEDICATION ?*UNUSUAL SHORTNESS OF BREATH ?*UNUSUAL BRUISING OR BLEEDING ?*URINARY PROBLEMS (pain or burning when urinating, or frequent urination) ?*BOWEL PROBLEMS (unusual diarrhea, constipation, pain near the anus) ?TENDERNESS IN MOUTH AND THROAT WITH OR WITHOUT PRESENCE OF ULCERS (sore throat, sores in mouth, or a toothache) ?UNUSUAL RASH, SWELLING OR PAIN  ?UNUSUAL VAGINAL DISCHARGE OR ITCHING  ? ?Items with * indicate a potential emergency and should be followed up as soon as possible or go to the Emergency Department if any problems should occur. ? ?Please show the CHEMOTHERAPY ALERT CARD or IMMUNOTHERAPY ALERT CARD at check-in to the  Emergency Department and triage nurse. ? ?Should you have questions after your visit or need to cancel or reschedule your appointment, please contact [facility] CANCER [place] AT [place]  [phone] and follow the prompts.  Office hours are 8:00 a.m. to 4:30 p.m. Monday - Friday. Please note that voicemails left after 4:00 p.m. may not be returned until the following business day.  We are closed weekends and major holidays. You have access to a nurse at all times for urgent questions. Please call the main number to the clinic [phone] and follow the prompts. ? ?For any non-urgent questions, you may also contact your provider using MyChart. We now offer e-Visits for anyone 44 and older to request care online for non-urgent symptoms. For details visit [email or web address]. ?  ?Also download the MyChart app! Go to the app store, search "MyChart", open the app, select [place], and log in with your MyChart username and password. ? ?Due to Covid, a mask is required upon entering the hospital/clinic. If you do not have a mask, one will be given to you upon arrival. For doctor visits, patients may have 1 support person aged 30 or older with them. For treatment visits, patients cannot have anyone with them due to current Covid guidelines and our immunocompromised population. Azacitidine suspension for injection (subcutaneous use) ?What is this medication? ?AZACITIDINE (ay [place]) is a chemotherapy drug. This medicine reduces the growth of cancer cells and can suppress the immune system. It is used for treating myelodysplastic syndrome or some types of leukemia. ?This medicine may be used for other purposes; ask your health  care provider or pharmacist if you have questions. ?COMMON BRAND NAME(S): Vidaza ?What should I tell my care team before I take this medication? ?They need to know if you have any of these conditions: ?kidney disease ?liver disease ?liver tumors ?an unusual or allergic reaction  to azacitidine, mannitol, other medicines, foods, dyes, or preservatives ?pregnant or trying to get pregnant ?breast-feeding ?How should I use this medication? ?This medicine is for injection under the skin. It is administered in a hospital or clinic by a specially trained health care professional. ?Talk to your pediatrician regarding the use of this medicine in children. While this drug may be prescribed for selected conditions, precautions do apply. ?Overdosage: If you think you have taken too much of this medicine contact a poison control center or emergency room at once. ?NOTE: This medicine is only for you. Do not share this medicine with others. ?What if I miss a dose? ?It is important not to miss your dose. Call your doctor or health care professional if you are unable to keep an appointment. ?What may interact with this medication? ?Interactions have not been studied. ?Give your health care provider a list of all the medicines, herbs, non-prescription drugs, or dietary supplements you use. Also tell them if you smoke, drink alcohol, or use illegal drugs. Some items may interact with your medicine. ?This list may not describe all possible interactions. Give your health care provider a list of all the medicines, herbs, non-prescription drugs, or dietary supplements you use. Also tell them if you smoke, drink alcohol, or use illegal drugs. Some items may interact with your medicine. ?What should I watch for while using this medication? ?Visit your doctor for checks on your progress. This drug may make you feel generally unwell. This is not uncommon, as chemotherapy can affect healthy cells as well as cancer cells. Report any side effects. Continue your course of treatment even though you feel ill unless your doctor tells you to stop. ?In some cases, you may be given additional medicines to help with side effects. Follow all directions for their use. ?Call your doctor or health care professional for advice if  you get a fever, chills or sore throat, or other symptoms of a cold or flu. Do not treat yourself. This drug decreases your body's ability to fight infections. Try to avoid being around people who are sick. ?This medicine may increase your risk to bruise or bleed. Call your doctor or health care professional if you notice any unusual bleeding. ?You may need blood work done while you are taking this medicine. ?Do not become pregnant while taking this medicine and for 6 months after the last dose. Women should inform their doctor if they wish to become pregnant or think they might be pregnant. Men should not father a child while taking this medicine and for 3 months after the last dose. There is a potential for serious side effects to an unborn child. Talk to your health care professional or pharmacist for more information. Do not breast-feed an infant while taking this medicine and for 1 week after the last dose. ?This medicine may interfere with the ability to have a child. Talk with your doctor or health care professional if you are concerned about your fertility. ?What side effects may I notice from receiving this medication? ?Side effects that you should report to your doctor or health care professional as soon as possible: ?allergic reactions like skin rash, itching or hives, swelling of the face, lips, or tongue ?  low blood counts - this medicine may decrease the number of white blood cells, red blood cells and platelets. You may be at increased risk for infections and bleeding. ?signs of infection - fever or chills, cough, sore throat, pain passing urine ?signs of decreased platelets or bleeding - bruising, pinpoint red spots on the skin, black, tarry stools, blood in the urine ?signs of decreased red blood cells - unusually weak or tired, fainting spells, lightheadedness ?signs and symptoms of kidney injury like trouble passing urine or change in the amount of urine ?signs and symptoms of liver injury like  dark yellow or brown urine; general ill feeling or flu-like symptoms; light-colored stools; loss of appetite; nausea; right upper belly pain; unusually weak or tired; yellowing of the eyes or skin ?Side

## 2021-08-29 ENCOUNTER — Inpatient Hospital Stay: Payer: PPO

## 2021-08-29 VITALS — BP 125/69 | HR 72 | Temp 97.6°F

## 2021-08-29 DIAGNOSIS — Z5111 Encounter for antineoplastic chemotherapy: Secondary | ICD-10-CM | POA: Diagnosis not present

## 2021-08-29 DIAGNOSIS — C9200 Acute myeloblastic leukemia, not having achieved remission: Secondary | ICD-10-CM

## 2021-08-29 MED ORDER — ONDANSETRON HCL 4 MG PO TABS
8.0000 mg | ORAL_TABLET | Freq: Once | ORAL | Status: AC
  Administered 2021-08-29: 8 mg via ORAL
  Filled 2021-08-29: qty 2

## 2021-08-29 MED ORDER — AZACITIDINE CHEMO SQ INJECTION
50.0000 mg/m2 | Freq: Once | INTRAMUSCULAR | Status: AC
  Administered 2021-08-29: 87.5 mg via SUBCUTANEOUS
  Filled 2021-08-29: qty 3.5

## 2021-08-29 NOTE — Patient Instructions (Signed)
[facility] CANCER CTR AT [place]-MEDICAL ONCOLOGY  Discharge Instructions: °Thank you for choosing [facility] to provide your oncology and hematology care.  ° °If you have a lab appointment with the Cancer Center, please go directly to the Cancer Center and check in at the registration area. °  °Wear comfortable clothing and clothing appropriate for easy access to any Portacath or PICC line.  ° °We strive to give you quality time with your provider. You may need to reschedule your appointment if you arrive late (15 or more minutes).  Arriving late affects you and other patients whose appointments are after yours.  Also, if you miss three or more appointments without notifying the office, you may be dismissed from the clinic at the provider’s discretion.    °  °For prescription refill requests, have your pharmacy contact our office and allow 72 hours for refills to be completed.   ° °Today you received the following chemotherapy and/or immunotherapy agents     °  °To help prevent nausea and vomiting after your treatment, we encourage you to take your nausea medication as directed. ° °BELOW ARE SYMPTOMS THAT SHOULD BE REPORTED IMMEDIATELY: °*FEVER GREATER THAN 100.4 F (38 °C) OR HIGHER °*CHILLS OR SWEATING °*NAUSEA AND VOMITING THAT IS NOT CONTROLLED WITH YOUR NAUSEA MEDICATION °*UNUSUAL SHORTNESS OF BREATH °*UNUSUAL BRUISING OR BLEEDING °*URINARY PROBLEMS (pain or burning when urinating, or frequent urination) °*BOWEL PROBLEMS (unusual diarrhea, constipation, pain near the anus) °TENDERNESS IN MOUTH AND THROAT WITH OR WITHOUT PRESENCE OF ULCERS (sore throat, sores in mouth, or a toothache) °UNUSUAL RASH, SWELLING OR PAIN  °UNUSUAL VAGINAL DISCHARGE OR ITCHING  ° °Items with * indicate a potential emergency and should be followed up as soon as possible or go to the Emergency Department if any problems should occur. ° °Please show the CHEMOTHERAPY ALERT CARD or IMMUNOTHERAPY ALERT CARD at check-in to the  Emergency Department and triage nurse. ° °Should you have questions after your visit or need to cancel or reschedule your appointment, please contact [facility] CANCER [facility] AT -MEDICAL ONCOLOGY  Dept: [phone]  and follow the prompts.  Office hours are 8:00 a.m. to 4:30 p.m. Monday - Friday. Please note that voicemails left after 4:00 p.m. may not be returned until the following business day.  We are closed weekends and major holidays. You have access to a nurse at all times for urgent questions. Please call the main number to the clinic Dept: [phone] and follow the prompts. ° ° °For any non-urgent questions, you may also contact your provider using MyChart. We now offer e-Visits for anyone 18 and older to request care online for non-urgent symptoms. For details visit [email or web address]. °  °Also download the MyChart app! Go to the app store, search "MyChart", open the app, select [facility], and log in with your MyChart username and password. ° °Due to Covid, a mask is required upon entering the hospital/clinic. If you do not have a mask, one will be given to you upon arrival. For doctor visits, patients may have 1 support person aged 18 or older with them. For treatment visits, patients cannot have anyone with them due to current Covid guidelines and our immunocompromised population.  ° °

## 2021-09-01 ENCOUNTER — Telehealth: Payer: Self-pay | Admitting: Pharmacist

## 2021-09-01 ENCOUNTER — Inpatient Hospital Stay: Payer: PPO | Attending: Oncology

## 2021-09-01 DIAGNOSIS — D709 Neutropenia, unspecified: Secondary | ICD-10-CM | POA: Insufficient documentation

## 2021-09-01 DIAGNOSIS — Z5111 Encounter for antineoplastic chemotherapy: Secondary | ICD-10-CM | POA: Insufficient documentation

## 2021-09-01 DIAGNOSIS — R5383 Other fatigue: Secondary | ICD-10-CM | POA: Diagnosis not present

## 2021-09-01 DIAGNOSIS — D696 Thrombocytopenia, unspecified: Secondary | ICD-10-CM | POA: Insufficient documentation

## 2021-09-01 DIAGNOSIS — R531 Weakness: Secondary | ICD-10-CM | POA: Insufficient documentation

## 2021-09-01 DIAGNOSIS — R971 Elevated cancer antigen 125 [CA 125]: Secondary | ICD-10-CM | POA: Insufficient documentation

## 2021-09-01 DIAGNOSIS — E860 Dehydration: Secondary | ICD-10-CM | POA: Diagnosis not present

## 2021-09-01 DIAGNOSIS — E876 Hypokalemia: Secondary | ICD-10-CM | POA: Insufficient documentation

## 2021-09-01 DIAGNOSIS — C9200 Acute myeloblastic leukemia, not having achieved remission: Secondary | ICD-10-CM

## 2021-09-01 LAB — CBC WITH DIFFERENTIAL/PLATELET
Abs Immature Granulocytes: 0 10*3/uL (ref 0.00–0.07)
Basophils Absolute: 0 10*3/uL (ref 0.0–0.1)
Basophils Relative: 0 %
Eosinophils Absolute: 0 10*3/uL (ref 0.0–0.5)
Eosinophils Relative: 3 %
HCT: 37.6 % (ref 36.0–46.0)
Hemoglobin: 12.6 g/dL (ref 12.0–15.0)
Immature Granulocytes: 0 %
Lymphocytes Relative: 42 %
Lymphs Abs: 0.5 10*3/uL — ABNORMAL LOW (ref 0.7–4.0)
MCH: 28.8 pg (ref 26.0–34.0)
MCHC: 33.5 g/dL (ref 30.0–36.0)
MCV: 86 fL (ref 80.0–100.0)
Monocytes Absolute: 0.1 10*3/uL (ref 0.1–1.0)
Monocytes Relative: 8 %
Neutro Abs: 0.5 10*3/uL — ABNORMAL LOW (ref 1.7–7.7)
Neutrophils Relative %: 47 %
Platelets: 174 10*3/uL (ref 150–400)
RBC: 4.37 MIL/uL (ref 3.87–5.11)
RDW: 16 % — ABNORMAL HIGH (ref 11.5–15.5)
WBC: 1.1 10*3/uL — CL (ref 4.0–10.5)
nRBC: 0 % (ref 0.0–0.2)

## 2021-09-01 LAB — COMPREHENSIVE METABOLIC PANEL
ALT: 27 U/L (ref 0–44)
AST: 15 U/L (ref 15–41)
Albumin: 3.6 g/dL (ref 3.5–5.0)
Alkaline Phosphatase: 108 U/L (ref 38–126)
Anion gap: 7 (ref 5–15)
BUN: 16 mg/dL (ref 8–23)
CO2: 27 mmol/L (ref 22–32)
Calcium: 9.2 mg/dL (ref 8.9–10.3)
Chloride: 107 mmol/L (ref 98–111)
Creatinine, Ser: 1.02 mg/dL — ABNORMAL HIGH (ref 0.44–1.00)
GFR, Estimated: 59 mL/min — ABNORMAL LOW (ref >60)
Glucose, Bld: 89 mg/dL (ref 70–99)
Potassium: 4.4 mmol/L (ref 3.5–5.1)
Sodium: 141 mmol/L (ref 135–145)
Total Bilirubin: 0.9 mg/dL (ref 0.3–1.2)
Total Protein: 6.9 g/dL (ref 6.5–8.1)

## 2021-09-01 MED ORDER — LEVOFLOXACIN 500 MG PO TABS: 500.0000 mg | ORAL_TABLET | Freq: Every day | ORAL | 1 refills | Status: DC

## 2021-09-01 NOTE — Telephone Encounter (Signed)
Oral Chemotherapy Pharmacist Encounter  ? ?Patien restart venetoclax last week, today her ANC is 0.5. She has one more week of venetoclax, expect her counts to continue to decline. She will need to restart infection prophylaxis medication.  ? ?Rx for levofloxacin sent to her local pharmacy. Attempted to call patient to let her know. No answer and unable to LVM.  ? ?Will continue to attempt to reach patient.  ? ? ?Darl Pikes, PharmD, BCPS, BCOP, CPP ?Hematology/Oncology Clinical Pharmacist ?[place]/[facility] Oral Chemotherapy Navigation [facility] ?[phone] ? ?09/01/2021 12:33 PM ? ?

## 2021-09-02 ENCOUNTER — Other Ambulatory Visit (HOSPITAL_COMMUNITY): Payer: Self-pay

## 2021-09-02 NOTE — Telephone Encounter (Signed)
Oral Chemotherapy Pharmacist Encounter  ? ?Was able to reach patient tolet her know about the levofloxacin Rx sent to her local pharmacy. During called, patient reported that she has been having diarrhea and gas for the last 12 hours. She has taken 2 doses of Lomotil so far today. Encouraged to to maintain hydration and continue her Lomotil. Patient offered [facility] appt, she is interesting in coming in tomorrow of evaluation. Message sent to [facility] team.  ? ? ?Darl Pikes, PharmD, BCPS, BCOP, CPP ?Hematology/Oncology Clinical Pharmacist ?[place]/[facility] Oral Chemotherapy Navigation Clinic ?[phone] ? ?09/02/2021 4:15 PM ? ?

## 2021-09-03 ENCOUNTER — Inpatient Hospital Stay: Payer: PPO

## 2021-09-03 ENCOUNTER — Inpatient Hospital Stay (HOSPITAL_BASED_OUTPATIENT_CLINIC_OR_DEPARTMENT_OTHER): Payer: PPO | Admitting: Hospice and Palliative Medicine

## 2021-09-03 ENCOUNTER — Other Ambulatory Visit: Payer: Self-pay

## 2021-09-03 VITALS — BP 132/79 | HR 71 | Temp 97.8°F | Resp 16 | Wt 129.0 lb

## 2021-09-03 DIAGNOSIS — R197 Diarrhea, unspecified: Secondary | ICD-10-CM

## 2021-09-03 DIAGNOSIS — C9200 Acute myeloblastic leukemia, not having achieved remission: Secondary | ICD-10-CM

## 2021-09-03 DIAGNOSIS — Z5111 Encounter for antineoplastic chemotherapy: Secondary | ICD-10-CM | POA: Diagnosis not present

## 2021-09-03 LAB — COMPREHENSIVE METABOLIC PANEL
ALT: 21 U/L (ref 0–44)
AST: 10 U/L — ABNORMAL LOW (ref 15–41)
Albumin: 3.6 g/dL (ref 3.5–5.0)
Alkaline Phosphatase: 107 U/L (ref 38–126)
Anion gap: 4 — ABNORMAL LOW (ref 5–15)
BUN: 16 mg/dL (ref 8–23)
CO2: 27 mmol/L (ref 22–32)
Calcium: 8.4 mg/dL — ABNORMAL LOW (ref 8.9–10.3)
Chloride: 107 mmol/L (ref 98–111)
Creatinine, Ser: 1 mg/dL (ref 0.44–1.00)
GFR, Estimated: >60 mL/min (ref >60)
Glucose, Bld: 103 mg/dL — ABNORMAL HIGH (ref 70–99)
Potassium: 3.5 mmol/L (ref 3.5–5.1)
Sodium: 138 mmol/L (ref 135–145)
Total Bilirubin: 0.8 mg/dL (ref 0.3–1.2)
Total Protein: 6.4 g/dL — ABNORMAL LOW (ref 6.5–8.1)

## 2021-09-03 LAB — CBC WITH DIFFERENTIAL/PLATELET
Abs Immature Granulocytes: 0 10*3/uL (ref 0.00–0.07)
Basophils Absolute: 0 10*3/uL (ref 0.0–0.1)
Basophils Relative: 0 %
Eosinophils Absolute: 0 10*3/uL (ref 0.0–0.5)
Eosinophils Relative: 1 %
HCT: 35 % — ABNORMAL LOW (ref 36.0–46.0)
Hemoglobin: 11.9 g/dL — ABNORMAL LOW (ref 12.0–15.0)
Immature Granulocytes: 0 %
Lymphocytes Relative: 41 %
Lymphs Abs: 0.6 10*3/uL — ABNORMAL LOW (ref 0.7–4.0)
MCH: 29 pg (ref 26.0–34.0)
MCHC: 34 g/dL (ref 30.0–36.0)
MCV: 85.2 fL (ref 80.0–100.0)
Monocytes Absolute: 0.2 10*3/uL (ref 0.1–1.0)
Monocytes Relative: 12 %
Neutro Abs: 0.6 10*3/uL — ABNORMAL LOW (ref 1.7–7.7)
Neutrophils Relative %: 46 %
Platelets: 151 10*3/uL (ref 150–400)
RBC: 4.11 MIL/uL (ref 3.87–5.11)
RDW: 15.7 % — ABNORMAL HIGH (ref 11.5–15.5)
WBC: 1.4 10*3/uL — CL (ref 4.0–10.5)
nRBC: 0 % (ref 0.0–0.2)

## 2021-09-03 MED ORDER — SODIUM CHLORIDE 0.9 % IV SOLN
INTRAVENOUS | Status: DC
  Filled 2021-09-03: qty 250

## 2021-09-03 NOTE — Progress Notes (Signed)
Pt reports that she had diarrhea yesterday for about 12 hours but only one time today. States that she is feeling much better than she did yesterday. She also has questions about the antibiotic that was prescribed.She is very concerned about the side effects. Reiterated the importance of the antibiotic and why it was prescribed.  ?

## 2021-09-03 NOTE — Progress Notes (Signed)
? ?Symptom Management Clinic ?[facility] ?[phone] [phone] ? ?Patient Care Team: ?[name], MD as PCP - General (Internal Medicine) ?Wellington Hampshire, MD as PCP - Cardiology (Cardiology)  ? ?Name of the patient: Debra Hale  ?[id number]  ?07/18/1950  ? ?Date of visit: 09/03/21 ? ?Reason for Consult: ?Debra Hale is a 71 y.o. 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   ? ?Patient saw Dr. [name] on 08/26/2021.  Most recent bone marrow biopsy on July 09, 2021 revealed increased blast count suggestive of disease progression.  Decision was made to add back in venetoclax with Vidaza. ? ?Patient was started on prophylactic Levaquin by Clearnce Sorrel, PharmD on 5/1. ? ?Patient presents to [facility] today for supportive care.  She reports that she had about 24 hours of diarrhea following her last dose of Vidaza.  Patient took Lomotil and reports resolution of diarrhea.  She denies fever or chills.  No pain.  No nausea or vomiting.  No other distressing symptomatic complaints today. ? ?Denies any neurologic complaints. Denies recent fevers or illnesses. Denies any easy bleeding or bruising. Reports fair appetite and denies weight loss. Denies chest pain. Denies any nausea, vomiting, constipation, or diarrhea. Denies urinary complaints. Patient offers no further specific complaints today. ? ?PAST MEDICAL HISTORY: ?Past Medical History:  ?Diagnosis Date  ? Anxiety   ? Arthritis   ? Osteoarthritis  ? Breast cancer ([place]) 2009  ? right breast lumpectomy with rad tx  ? Colon cancer ([place])   ? surgery with chemo and rad tx  ? Complication of anesthesia   ? GERD (gastroesophageal reflux disease)   ? Heart palpitations   ? History of hiatal hernia   ? History of kidney stones   ? Hypothyroidism   ? Liver disease   ? Liver nodule   ? s/p negative biopsy  ? Malignant neoplasm of thyroid gland ([place]) 2002  ? s/p surgery  and XRT  ? Osteoporosis   ? Other and unspecified hyperlipidemia   ? Palpitations   ? Personal history of chemotherapy   ? Personal history of malignant neoplasm of large intestine   ? carcinoma - cecum, s/p right laparoscopic colectomy - s/p chemotherapy and XRT  ? Personal history of radiation therapy   ? Pneumonia 2019  ? PONV (postoperative nausea and vomiting)   ? Pure hypercholesterolemia   ? Unspecified hereditary and idiopathic peripheral neuropathy   ? ? ?PAST SURGICAL HISTORY:  ?Past Surgical History:  ?Procedure Laterality Date  ? APPENDECTOMY  1985  ? BREAST BIOPSY Right 2009  ? positive  ? BREAST BIOPSY Right 2009  ? negative  ? BREAST LUMPECTOMY Right 2009  ? breast cancer  ? CHOLECYSTECTOMY  1995  ? COLONOSCOPY    ? COLONOSCOPY WITH PROPOFOL N/A 10/29/2017  ? Procedure: COLONOSCOPY WITH PROPOFOL;  Surgeon: Manya Silvas, MD;  Location: [facility] ENDOSCOPY;  Service: Endoscopy;  Laterality: N/A;  ? [place] OF UTERUS  1990  ? DILATION AND CURETTAGE, DIAGNOSTIC / THERAPEUTIC  1990  ? ESOPHAGOGASTRODUODENOSCOPY    ? ESOPHAGOGASTRODUODENOSCOPY (EGD) WITH PROPOFOL N/A 10/29/2017  ? Procedure: ESOPHAGOGASTRODUODENOSCOPY (EGD) WITH PROPOFOL;  Surgeon: Manya Silvas, MD;  Location[facility] ENDOSCOPY;  Service: Endoscopy;  Laterality: N/A;  ? INCISION AND DRAINAGE PERIRECTAL ABSCESS N/A 06/17/2020  ? Procedure: IRRIGATION AND DEBRIDEMENT PERIRECTAL ABSCESS;  Surgeon: Jules Husbands, MD;  Location: [facility] ORS;  Service: General;  Laterality: N/A;  ?  IR BONE MARROW BIOPSY & ASPIRATION  07/09/2021  ? LAPAROSCOPIC PARTIAL COLECTOMY    ? stage 3-C carcinoma of the cecum, s/p chemotherapy and xrt  ? LITHOTRIPSY    ? SIGMOIDOSCOPY  08/26/1993  ? THYROID LOBECTOMY  2002  ? s/p XRT  ? TOTAL HIP ARTHROPLASTY Right 10/24/2019  ? Procedure: TOTAL HIP ARTHROPLASTY;  Surgeon: Corky Mull, MD;  Location: [facility] ORS;  Service: Orthopedics;  Laterality: Right;  ? ? 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[place]) ? ?  ?06/10/2020 -  Chemotherapy  ? Patient is on Treatment Plan : AML dose reduced azacitidine SQ D1-5 q28d  ? ?  ?  ? ? ?ALLERGIES:  is allergic to demeclocycline, sulfa antibiotics, tetracyclines & related, augmentin [amoxicillin-pot clavulanate], bentyl [dicyclomine hcl], ciprofloxacin, codeine, dicyclomine hcl, epinephrine, flagyl [metronidazole], librax [chlordiazepoxide-clidinium], novocain [procaine], phenobarbital, prednisone, and ultram [tramadol]. ? ?MEDICATIONS:  ?Current Outpatient Medications  ?Medication Sig Dispense Refill  ? Azelastine-Fluticasone 137-50 MCG/ACT SUSP SPRAY 2 SPRAYS INTO EACH NOSTRIL EVERY DAY 23 g 3  ? clonazePAM (KLONOPIN) 0.5 MG tablet Take 1.5 mg by mouth 2 (two) times daily.   0  ? diphenoxylate-atropine (LOMOTIL) 2.5-0.025 MG tablet Take 1 tablet by mouth 4 (four) times daily as needed for diarrhea or loose stools. 60 tablet 1  ? escitalopram (LEXAPRO) 20 MG tablet Take 20 mg by mouth every morning.     ? levofloxacin (LEVAQUIN) 500 MG tablet Take 1 tablet (500 mg total) by mouth daily. 30 tablet 1  ? levothyroxine (SYNTHROID) 125 MCG tablet TAKE 1 TABLET EVERY DAY ON EMPTY STOMACHWITH A GLASS OF WATER AT LEAST 30-60 MINBEFORE BREAKFAST 90 tablet 1  ? loperamide (IMODIUM) 2 MG capsule Take by mouth.    ? metoprolol tartrate (LOPRESSOR) 25 MG tablet TAKE 1 TABLET (25 MG TOTAL) BY MOUTH AS NEEDED (AS NEEDED UP TO TWICE DAILY FOR PALPITATIONS). (Patient not taking: Reported on 07/29/2021) 60 tablet 1  ? ondansetron (ZOFRAN) 8 MG tablet Take 1 tablet (8 mg total) by mouth 2 (two) times daily as needed for nausea or vomiting. 20 tablet 0  ? oxyCODONE-acetaminophen (PERCOCET/ROXICET) 5-325 MG tablet Take 1 tablet by mouth every 6 (six) hours as needed for severe pain. (Patient not taking: Reported on 07/29/2021) 16 tablet 0  ? pantoprazole (PROTONIX) 40 MG tablet TAKE ONE TABLET  (40 MG) BY MOUTH EVERY DAY 90 tablet 2  ? potassium chloride SA (KLOR-CON M) 20 MEQ tablet TAKE ONE (1) TABLET BY MOUTH TWO TIMES PER DAY 60 tablet 0  ? prochlorperazine (COMPAZINE) 10 MG tablet Take 1 tablet (10 mg total) by mouth every 6 (six) hours as needed for refractory nausea / vomiting. (Patient not taking: Reported on 07/29/2021) 30 tablet 0  ? valACYclovir (VALTREX) 500 MG tablet Take 1 tablet (500 mg total) by mouth daily. 90 tablet 3  ? venetoclax (VENCLEXTA) 100 MG tablet Take 4 tablets (400 mg total) by mouth daily. Take for 14 days, then hold for 14 days. Repeat every 28 days. Take with a meal and a full glass of water. 56 tablet 1  ? ?Current Facility-Administered Medications  ?Medication Dose Route Frequency Provider Last Rate Last Admin  ? ondansetron (ZOFRAN) tablet 8 mg  8 mg Oral Once Jodelle Green, FNP      ? ?Facility-Administered Medications Ordered in Other Visits  ?Medication Dose Route Frequency Provider Last Rate Last Admin  ?  potassium chloride (KLOR-CON) CR tablet 40 mEq  40 mEq Oral BID Lloyd Huger, MD   40 mEq at 07/08/20 1136  ? ? ?VITAL SIGNS: ?BP 132/79   Pulse 71   Temp 97.8 ?F (36.6 ?C) (Tympanic)   Resp 16   Wt 129 lb (58.5 kg)   SpO2 100%   BMI 22.14 kg/m?  ?Filed Weights  ? 09/03/21 1300  ?Weight: 129 lb (58.5 kg)  ?  ?Estimated body mass index is 22.14 kg/m? as calculated from the following: ?  Height as of 08/26/21: 5' 4"  (1.626 m). ?  Weight as of this encounter: 129 lb (58.5 kg). ? ?LABS: ?CBC: ?   ?Component Value Date/Time  ? WBC 1.4 (LL) 09/03/2021 1338  ? HGB 11.9 (L) 09/03/2021 1338  ? HGB 12.5 05/09/2018 1147  ? HCT 35.0 (L) 09/03/2021 1338  ? HCT 36.8 05/09/2018 1147  ? PLT 151 09/03/2021 1338  ? PLT 285 05/09/2018 1147  ? MCV 85.2 09/03/2021 1338  ? MCV 86 05/09/2018 1147  ? MCV 89 11/30/2013 1419  ? NEUTROABS 0.6 (L) 09/03/2021 1338  ? NEUTROABS 2.9 11/20/2013 1404  ? LYMPHSABS 0.6 (L) 09/03/2021 1338  ? LYMPHSABS 0.9 (L) 11/20/2013 1404  ? MONOABS  0.2 09/03/2021 1338  ? MONOABS 0.2 11/20/2013 1404  ? EOSABS 0.0 09/03/2021 1338  ? EOSABS 0.1 11/20/2013 1404  ? BASOSABS 0.0 09/03/2021 1338  ? BASOSABS 0.0 11/20/2013 1404  ? ?Comprehensive Metabolic Panel: ?

## 2021-09-08 ENCOUNTER — Inpatient Hospital Stay: Payer: PPO

## 2021-09-08 DIAGNOSIS — C9200 Acute myeloblastic leukemia, not having achieved remission: Secondary | ICD-10-CM

## 2021-09-08 DIAGNOSIS — Z5111 Encounter for antineoplastic chemotherapy: Secondary | ICD-10-CM | POA: Diagnosis not present

## 2021-09-08 LAB — CBC WITH DIFFERENTIAL/PLATELET
Abs Immature Granulocytes: 0 10*3/uL (ref 0.00–0.07)
Basophils Absolute: 0 10*3/uL (ref 0.0–0.1)
Basophils Relative: 0 %
Eosinophils Absolute: 0 10*3/uL (ref 0.0–0.5)
Eosinophils Relative: 0 %
HCT: 37.9 % (ref 36.0–46.0)
Hemoglobin: 13.1 g/dL (ref 12.0–15.0)
Immature Granulocytes: 0 %
Lymphocytes Relative: 36 %
Lymphs Abs: 0.5 10*3/uL — ABNORMAL LOW (ref 0.7–4.0)
MCH: 29.2 pg (ref 26.0–34.0)
MCHC: 34.6 g/dL (ref 30.0–36.0)
MCV: 84.6 fL (ref 80.0–100.0)
Monocytes Absolute: 0.2 10*3/uL (ref 0.1–1.0)
Monocytes Relative: 15 %
Neutro Abs: 0.6 10*3/uL — ABNORMAL LOW (ref 1.7–7.7)
Neutrophils Relative %: 49 %
Platelets: 93 10*3/uL — ABNORMAL LOW (ref 150–400)
RBC: 4.48 MIL/uL (ref 3.87–5.11)
RDW: 16.1 % — ABNORMAL HIGH (ref 11.5–15.5)
Smear Review: NORMAL
WBC: 1.3 10*3/uL — CL (ref 4.0–10.5)
nRBC: 0 % (ref 0.0–0.2)

## 2021-09-08 LAB — COMPREHENSIVE METABOLIC PANEL
ALT: 18 U/L (ref 0–44)
AST: 13 U/L — ABNORMAL LOW (ref 15–41)
Albumin: 3.8 g/dL (ref 3.5–5.0)
Alkaline Phosphatase: 115 U/L (ref 38–126)
Anion gap: 8 (ref 5–15)
BUN: 16 mg/dL (ref 8–23)
CO2: 24 mmol/L (ref 22–32)
Calcium: 8.5 mg/dL — ABNORMAL LOW (ref 8.9–10.3)
Chloride: 108 mmol/L (ref 98–111)
Creatinine, Ser: 1.17 mg/dL — ABNORMAL HIGH (ref 0.44–1.00)
GFR, Estimated: 50 mL/min — ABNORMAL LOW (ref >60)
Glucose, Bld: 108 mg/dL — ABNORMAL HIGH (ref 70–99)
Potassium: 3.5 mmol/L (ref 3.5–5.1)
Sodium: 140 mmol/L (ref 135–145)
Total Bilirubin: 1 mg/dL (ref 0.3–1.2)
Total Protein: 6.8 g/dL (ref 6.5–8.1)

## 2021-09-10 ENCOUNTER — Other Ambulatory Visit: Payer: Self-pay | Admitting: Internal Medicine

## 2021-09-11 ENCOUNTER — Ambulatory Visit (INDEPENDENT_AMBULATORY_CARE_PROVIDER_SITE_OTHER): Payer: PPO

## 2021-09-11 VITALS — Ht 64.0 in | Wt 129.0 lb

## 2021-09-11 DIAGNOSIS — Z0000 Encounter for general adult medical examination without abnormal findings: Secondary | ICD-10-CM | POA: Diagnosis not present

## 2021-09-11 NOTE — Progress Notes (Signed)
Subjective:   Debra Hale is a 71 y.o. female who presents for Medicare Annual (Subsequent) preventive examination.  Review of Systems    No ROS.  Medicare Wellness Virtual Visit.  Visual/audio telehealth visit, UTA vital signs.   See social history for additional risk factors.   Cardiac Risk Factors include: advanced age (>54men, >22 women)     Objective:    Today's Vitals   09/11/21 1509  Weight: 129 lb (58.5 kg)  Height: 5\' 4"  (1.626 m)   Body mass index is 22.14 kg/m.     09/11/2021    3:15 PM 08/27/2021    2:10 PM 08/26/2021    1:00 PM 08/26/2021   10:49 AM 08/25/2021    1:00 PM 08/01/2021    2:38 PM 07/29/2021   10:53 AM  Advanced Directives  Does Patient Have a Medical Advance Directive? No No No No No No No  Would patient like information on creating a medical advance directive? No - Patient declined No - Patient declined No - Patient declined No - Patient declined No - Patient declined No - Patient declined No - Patient declined    Current Medications (verified) Outpatient Encounter Medications as of 09/11/2021  Medication Sig   Azelastine-Fluticasone 137-50 MCG/ACT SUSP SPRAY 2 SPRAYS INTO EACH NOSTRIL EVERY DAY   clonazePAM (KLONOPIN) 0.5 MG tablet Take 1.5 mg by mouth 2 (two) times daily.    diphenoxylate-atropine (LOMOTIL) 2.5-0.025 MG tablet Take 1 tablet by mouth 4 (four) times daily as needed for diarrhea or loose stools.   escitalopram (LEXAPRO) 20 MG tablet Take 20 mg by mouth every morning.    levofloxacin (LEVAQUIN) 500 MG tablet Take 1 tablet (500 mg total) by mouth daily.   levothyroxine (SYNTHROID) 125 MCG tablet TAKE 1 TABLET EVERY DAY ON EMPTY STOMACHWITH A GLASS OF WATER AT LEAST 30-60 MINBEFORE BREAKFAST   loperamide (IMODIUM) 2 MG capsule Take by mouth.   metoprolol tartrate (LOPRESSOR) 25 MG tablet TAKE 1 TABLET (25 MG TOTAL) BY MOUTH AS NEEDED (AS NEEDED UP TO TWICE DAILY FOR PALPITATIONS). (Patient not taking: Reported on 07/29/2021)    ondansetron (ZOFRAN) 8 MG tablet Take 1 tablet (8 mg total) by mouth 2 (two) times daily as needed for nausea or vomiting.   oxyCODONE-acetaminophen (PERCOCET/ROXICET) 5-325 MG tablet Take 1 tablet by mouth every 6 (six) hours as needed for severe pain. (Patient not taking: Reported on 07/29/2021)   pantoprazole (PROTONIX) 40 MG tablet TAKE ONE TABLET (40 MG) BY MOUTH EVERY DAY   potassium chloride SA (KLOR-CON M) 20 MEQ tablet TAKE ONE (1) TABLET BY MOUTH TWO TIMES PER DAY   prochlorperazine (COMPAZINE) 10 MG tablet Take 1 tablet (10 mg total) by mouth every 6 (six) hours as needed for refractory nausea / vomiting. (Patient not taking: Reported on 07/29/2021)   valACYclovir (VALTREX) 500 MG tablet Take 1 tablet (500 mg total) by mouth daily.   venetoclax (VENCLEXTA) 100 MG tablet Take 4 tablets (400 mg total) by mouth daily. Take for 14 days, then hold for 14 days. Repeat every 28 days. Take with a meal and a full glass of water.   Facility-Administered Encounter Medications as of 09/11/2021  Medication   ondansetron (ZOFRAN) tablet 8 mg   potassium chloride (KLOR-CON) CR tablet 40 mEq    Allergies (verified) Demeclocycline, Sulfa antibiotics, Tetracyclines & related, Augmentin [amoxicillin-pot clavulanate], Bentyl [dicyclomine hcl], Ciprofloxacin, Codeine, Dicyclomine hcl, Epinephrine, Flagyl [metronidazole], Librax [chlordiazepoxide-clidinium], Novocain [procaine], Phenobarbital, Prednisone, and Ultram [tramadol]   History:  Past Medical History:  Diagnosis Date   Anxiety    Arthritis    Osteoarthritis   Breast cancer (HCC) 2009   right breast lumpectomy with rad tx   Colon cancer (HCC)    surgery with chemo and rad tx   Complication of anesthesia    GERD (gastroesophageal reflux disease)    Heart palpitations    History of hiatal hernia    History of kidney stones    Hypothyroidism    Liver disease    Liver nodule    s/p negative biopsy   Malignant neoplasm of thyroid gland (HCC)  2002   s/p surgery and XRT   Osteoporosis    Other and unspecified hyperlipidemia    Palpitations    Personal history of chemotherapy    Personal history of malignant neoplasm of large intestine    carcinoma - cecum, s/p right laparoscopic colectomy - s/p chemotherapy and XRT   Personal history of radiation therapy    Pneumonia 2019   PONV (postoperative nausea and vomiting)    Pure hypercholesterolemia    Unspecified hereditary and idiopathic peripheral neuropathy    Past Surgical History:  Procedure Laterality Date   APPENDECTOMY  1985   BREAST BIOPSY Right 2009   positive   BREAST BIOPSY Right 2009   negative   BREAST LUMPECTOMY Right 2009   breast cancer   CHOLECYSTECTOMY  1995   COLONOSCOPY     COLONOSCOPY WITH PROPOFOL N/A 10/29/2017   Procedure: COLONOSCOPY WITH PROPOFOL;  Surgeon: Scot Jun, MD;  Location: [facility] ENDOSCOPY;  Service: Endoscopy;  Laterality: N/A;   DILATION AND CURETTAGE OF UTERUS  1990   DILATION AND CURETTAGE, DIAGNOSTIC / THERAPEUTIC  1990   ESOPHAGOGASTRODUODENOSCOPY     ESOPHAGOGASTRODUODENOSCOPY (EGD) WITH PROPOFOL N/A 10/29/2017   Procedure: ESOPHAGOGASTRODUODENOSCOPY (EGD) WITH PROPOFOL;  Surgeon: Scot Jun, MD;  Location: [facility] ENDOSCOPY;  Service: Endoscopy;  Laterality: N/A;   INCISION AND DRAINAGE PERIRECTAL ABSCESS N/A 06/17/2020   Procedure: IRRIGATION AND DEBRIDEMENT PERIRECTAL ABSCESS;  Surgeon: Leafy Ro, MD;  Location: [facility] ORS;  Service: General;  Laterality: N/A;   IR BONE MARROW BIOPSY & ASPIRATION  07/09/2021   LAPAROSCOPIC PARTIAL COLECTOMY     stage 3-C carcinoma of the cecum, s/p chemotherapy and xrt   LITHOTRIPSY     SIGMOIDOSCOPY  08/26/1993   THYROID LOBECTOMY  2002   s/p XRT   TOTAL HIP ARTHROPLASTY Right 10/24/2019   Procedure: TOTAL HIP ARTHROPLASTY;  Surgeon: Christena Flake, MD;  Location: [facility] ORS;  Service: Orthopedics;  Laterality: Right;   Family History  Problem Relation Age of Onset   Stroke Mother         85s   Alzheimer's disease Mother    Lung cancer Father    Prostate cancer Father    Cancer Father        Colon   Colon cancer Father    Breast cancer Sister        23's   Lung cancer Sister    Breast cancer Maternal Aunt    Social History   Socioeconomic History   Marital status: Single    Spouse name: Not on file   Number of children: 0   Years of education: Not on file   Highest education level: Not on file  Occupational History   Not on file  Tobacco Use   Smoking status: Never   Smokeless tobacco: Never  Vaping Use   Vaping Use: Never used  Substance  and Sexual Activity   Alcohol use: No    Alcohol/week: 0.0 standard drinks   Drug use: No   Sexual activity: Not Currently  Other Topics Concern   Not on file  Social History Narrative   Not on file   Social Determinants of Health   Financial Resource Strain: Low Risk    Difficulty of Paying Living Expenses: Not very hard  Food Insecurity: No Food Insecurity   Worried About Running Out of Food in the Last Year: Never true   Ran Out of Food in the Last Year: Never true  Transportation Needs: No Transportation Needs   Lack of Transportation (Medical): No   Lack of Transportation (Non-Medical): No  Physical Activity: Insufficiently Active   Days of Exercise per Week: 3 days   Minutes of Exercise per Session: 10 min  Stress: No Stress Concern Present   Feeling of Stress : Not at all  Social Connections: Socially Isolated   Frequency of Communication with Friends and Family: Once a week   Frequency of Social Gatherings with Friends and Family: Once a week   Attends Religious Services: Never   Database administrator or Organizations: No   Attends Engineer, structural: Never   Marital Status: Divorced   Tobacco Counseling Counseling given: Not Answered   Clinical Intake:  Pre-visit preparation completed: Yes        Diabetes: No  How often do you need to have someone help you when you  read instructions, pamphlets, or other written materials from your doctor or pharmacy?: 1 - Never   Interpreter Needed?: No    Activities of Daily Living    09/11/2021    3:17 PM 07/09/2021    9:09 AM  In your present state of health, do you have any difficulty performing the following activities:  Hearing? 0 0  Vision? 0 0  Difficulty concentrating or making decisions? 0 0  Walking or climbing stairs? 0   Dressing or bathing? 0 0  Doing errands, shopping? 0   Preparing Food and eating ? N   Using the Toilet? N   In the past six months, have you accidently leaked urine? N   Do you have problems with loss of bowel control? N   Managing your Medications? N   Managing your Finances? N   Housekeeping or managing your Housekeeping? N    Patient Care Team: [place], MD as PCP - General (Internal Medicine) [place] Ouch, MD as PCP - Cardiology (Cardiology)  Indicate any recent Medical Services you may have received from other than [facility] providers in the past year (date may be approximate).     Assessment:   This is a routine wellness examination for Mckelle.  Virtual Visit via Telephone Note  I connected with  [name] on 09/11/21 at  3:00 PM EDT by telephone and verified that I am speaking with the correct person using two identifiers.  Persons participating in the virtual visit: patient/Nurse Health Advisor   I discussed the limitations of performing an evaluation and management service by telehealth. We continued and completed visit with audio only. Some vital signs may be absent or patient reported.   Hearing/Vision screen Hearing Screening - Comments:: Patient is able to hear conversational tones without difficulty. Ringing in the ears. Audiology testing deferred per patient request. Vision Screening - Comments:: Followed by [facility]  Wears corrective lenses reading glasses  They have regular follow up with the ophthalmologist  Dietary  issues and  exercise activities discussed: Current Exercise Habits: Home exercise routine, Intensity: Mild Healthy diet Good water intake   Goals Addressed               This Visit's Progress     Patient Stated     Follow up with Primary Care Provider (pt-stated)        As needed.       Depression Screen    09/11/2021    3:17 PM 07/21/2021   11:48 AM 07/30/2020   12:20 PM 04/23/2020    4:05 PM 09/28/2019    3:56 PM 05/02/2018   11:04 AM 09/08/2016    3:40 PM  PHQ 2/9 Scores  PHQ - 2 Score 0 2 3  0 6   PHQ- 9 Score  5 12  0 24   Exception Documentation    Medical reason   Other- indicate reason in comment box  Not completed       Currently in treatment with Dr. Viona Gilmore every 4-6 weeks    Fall Risk    06/19/2020    9:39 AM 06/07/2019    2:13 PM 09/14/2017    2:47 PM 09/08/2016    3:40 PM 03/19/2016    4:05 PM  Fall Risk   Falls in the past year? 0 0 No No No  Follow up  Falls evaluation completed      FALL RISK PREVENTION PERTAINING TO THE HOME: Home free of loose throw rugs in walkways, pet beds, electrical cords, etc? Yes  Adequate lighting in your home to reduce risk of falls? Yes   ASSISTIVE DEVICES UTILIZED TO PREVENT FALLS: Use of a cane, walker or w/c? No   TIMED UP AND GO: Was the test performed? No .   Cognitive Function: Patient is alert and oriented x3.     09/08/2016    3:56 PM  MMSE - Mini Mental State Exam  Orientation to time 5  Orientation to Place 5  Registration 3  Attention/ Calculation 5  Recall 3  Language- name 2 objects 2  Language- repeat 1  Language- follow 3 step command 3  Language- read & follow direction 1  Write a sentence 1  Copy design 1  Total score 30        06/07/2019    2:27 PM  6CIT Screen  What Year? 0 points  What month? 0 points  What time? 0 points  Count back from 20 0 points  Months in reverse 0 points    Immunizations Immunization History  Administered Date(s) Administered   Hep A / Hep B 10/16/2013   Influenza  Split 02/04/2012   Influenza, High Dose Seasonal PF 03/02/2017   Influenza,inj,Quad PF,6+ Mos 01/23/2014, 06/09/2016   Pneumococcal Conjugate-13 01/05/2012   TDAP status: Due, Education has been provided regarding the importance of this vaccine. Advised may receive this vaccine at local pharmacy or Health Dept. Aware to provide a copy of the vaccination record if obtained from local pharmacy or Health Dept. Verbalized acceptance and understanding.  Shingrix Completed?: No.    Education has been provided regarding the importance of this vaccine. Patient has been advised to call insurance company to determine out of pocket expense if they have not yet received this vaccine. Advised may also receive vaccine at local pharmacy or Health Dept. Verbalized acceptance and understanding.  Screening Tests Health Maintenance  Topic Date Due   Hepatitis C Screening  Never done   Zoster Vaccines- Shingrix (1 of  2) 12/12/2021 (Originally 11/15/1969)   Pneumonia Vaccine 47+ Years old (2 - PPSV23 if available, else PCV20) 05/26/2022 (Originally 01/04/2013)   COLONOSCOPY (Pts 45-86yrs Insurance coverage will need to be confirmed)  05/26/2022 (Originally 10/29/2020)   TETANUS/TDAP  09/12/2022 (Originally 11/15/1969)   INFLUENZA VACCINE  12/02/2021   MAMMOGRAM  01/29/2022   DEXA SCAN  Completed   HPV VACCINES  Aged Out   COVID-19 Vaccine  Discontinued   Health Maintenance Health Maintenance Due  Topic Date Due   Hepatitis C Screening  Never done   Lung Cancer Screening: (Low Dose CT Chest recommended if Age 33-80 years, 30 pack-year currently smoking OR have quit w/in 15years.) does not qualify.   Hepatitis C Screening: patient consent to order as PCP directs. Deferred.   Vision Screening: Recommended annual ophthalmology exams for early detection of glaucoma and other disorders of the eye.  Dental Screening: Recommended annual dental exams for proper oral hygiene  Community Resource Referral / Chronic  Care Management: CRR required this visit?  No   CCM required this visit?  No      Plan:   Keep all routine maintenance appointments.   I have personally reviewed and noted the following in the patient's chart:   Medical and social history Use of alcohol, tobacco or illicit drugs  Current medications and supplements including opioid prescriptions.  Functional ability and status Nutritional status Physical activity Advanced directives List of other physicians Hospitalizations, surgeries, and ER visits in previous 12 months Vitals Screenings to include cognitive, depression, and falls Referrals and appointments  In addition, I have reviewed and discussed with patient certain preventive protocols, quality metrics, and best practice recommendations. A written personalized care plan for preventive services as well as general preventive health recommendations were provided to patient.     Ashok Pall, LPN   1/61/0960

## 2021-09-11 NOTE — Patient Instructions (Addendum)
?  Ms. Holtry , ?Thank you for taking time to come for your Medicare Wellness Visit. I appreciate your ongoing commitment to your health goals. Please review the following plan we discussed and let me know if I can assist you in the future.  ? ?These are the goals we discussed: ? Goals   ? ?  ? Patient Stated  ?   Follow up with Primary Care Provider (pt-stated)   ?   As needed. ?  ? ?  ?  ?This is a list of the screening recommended for you and due dates:  ?Health Maintenance  ?Topic Date Due  ? Hepatitis C Screening: USPSTF Recommendation to screen - Ages 26-79 yo.  Never done  ? Zoster (Shingles) Vaccine (1 of 2) 12/12/2021*  ? Pneumonia Vaccine (2 - PPSV23 if available, else PCV20) 05/26/2022*  ? Colon Cancer Screening  05/26/2022*  ? Tetanus Vaccine  09/12/2022*  ? Flu Shot  12/02/2021  ? Mammogram  01/29/2022  ? DEXA scan (bone density measurement)  Completed  ? HPV Vaccine  Aged Out  ? COVID-19 Vaccine  Discontinued  ?*Topic was postponed. The date shown is not the original due date.  ?  ?

## 2021-09-15 ENCOUNTER — Inpatient Hospital Stay: Payer: PPO

## 2021-09-15 ENCOUNTER — Other Ambulatory Visit (HOSPITAL_COMMUNITY): Payer: Self-pay

## 2021-09-15 DIAGNOSIS — C9200 Acute myeloblastic leukemia, not having achieved remission: Secondary | ICD-10-CM

## 2021-09-15 DIAGNOSIS — Z5111 Encounter for antineoplastic chemotherapy: Secondary | ICD-10-CM | POA: Diagnosis not present

## 2021-09-15 LAB — CBC WITH DIFFERENTIAL/PLATELET
Abs Immature Granulocytes: 0.01 10*3/uL (ref 0.00–0.07)
Basophils Absolute: 0 10*3/uL (ref 0.0–0.1)
Basophils Relative: 0 %
Eosinophils Absolute: 0 10*3/uL (ref 0.0–0.5)
Eosinophils Relative: 0 %
HCT: 36.8 % (ref 36.0–46.0)
Hemoglobin: 12.9 g/dL (ref 12.0–15.0)
Immature Granulocytes: 1 %
Lymphocytes Relative: 41 %
Lymphs Abs: 0.5 10*3/uL — ABNORMAL LOW (ref 0.7–4.0)
MCH: 29.9 pg (ref 26.0–34.0)
MCHC: 35.1 g/dL (ref 30.0–36.0)
MCV: 85.2 fL (ref 80.0–100.0)
Monocytes Absolute: 0.1 10*3/uL (ref 0.1–1.0)
Monocytes Relative: 6 %
Neutro Abs: 0.6 10*3/uL — ABNORMAL LOW (ref 1.7–7.7)
Neutrophils Relative %: 52 %
Platelets: 53 10*3/uL — ABNORMAL LOW (ref 150–400)
RBC: 4.32 MIL/uL (ref 3.87–5.11)
RDW: 16.4 % — ABNORMAL HIGH (ref 11.5–15.5)
WBC: 1.2 10*3/uL — CL (ref 4.0–10.5)
nRBC: 0 % (ref 0.0–0.2)

## 2021-09-15 LAB — COMPREHENSIVE METABOLIC PANEL
ALT: 22 U/L (ref 0–44)
AST: 15 U/L (ref 15–41)
Albumin: 3.8 g/dL (ref 3.5–5.0)
Alkaline Phosphatase: 115 U/L (ref 38–126)
Anion gap: 7 (ref 5–15)
BUN: 19 mg/dL (ref 8–23)
CO2: 24 mmol/L (ref 22–32)
Calcium: 7.8 mg/dL — ABNORMAL LOW (ref 8.9–10.3)
Chloride: 107 mmol/L (ref 98–111)
Creatinine, Ser: 1.05 mg/dL — ABNORMAL HIGH (ref 0.44–1.00)
GFR, Estimated: 57 mL/min — ABNORMAL LOW (ref >60)
Glucose, Bld: 85 mg/dL (ref 70–99)
Potassium: 3.4 mmol/L — ABNORMAL LOW (ref 3.5–5.1)
Sodium: 138 mmol/L (ref 135–145)
Total Bilirubin: 0.9 mg/dL (ref 0.3–1.2)
Total Protein: 6.5 g/dL (ref 6.5–8.1)

## 2021-09-17 ENCOUNTER — Other Ambulatory Visit (HOSPITAL_COMMUNITY): Payer: Self-pay

## 2021-09-21 NOTE — Progress Notes (Unsigned)
Debra Hale  [phone] [phone]  ID: LAMESHIA HYPOLITE OB: [birth year]  MR#: [id number]  CSN#:[id number]  Patient Care Team: [name], MD as PCP - General (Internal Medicine) Wellington Hampshire, MD as PCP - Cardiology (Cardiology)  CHIEF COMPLAINT: AML.  INTERVAL HISTORY: Patient returns to clinic today for further evaluation, continuation of Vidaza and adding back in venetoclax to her treatment regimen.  She is highly anxious, but otherwise feels well.  She continues to tolerate her treatments well without significant side effects. She has chronic weakness and fatigue.  She denies any pain.  She has no neurologic complaints.  She denies any recent fevers or illnesses. She has no chest pain, shortness of breath, cough, or hemoptysis.  She denies any nausea, vomiting, constipation, or diarrhea.  She has no urinary complaints.  Patient offers no further specific complaints today.  REVIEW OF SYSTEMS:   Review of Systems  Constitutional:  Positive for malaise/fatigue. Negative for fever and weight loss.  Respiratory: Negative.  Negative for cough, hemoptysis and shortness of breath.   Cardiovascular: Negative.  Negative for chest pain and leg swelling.  Gastrointestinal:  Negative for abdominal pain and diarrhea.  Genitourinary: Negative.  Negative for dysuria.  Musculoskeletal: Negative.  Negative for back pain and falls.  Skin: Negative.  Negative for rash.  Neurological:  Positive for weakness. Negative for dizziness, focal weakness and headaches.  Psychiatric/Behavioral:  Negative for memory loss. The patient is nervous/anxious.    As per HPI. Otherwise, a complete review of systems is negative.  PAST MEDICAL HISTORY: Past Medical History:  Diagnosis Date   Anxiety    Arthritis    Osteoarthritis   Breast cancer ([place]) 2009   right breast lumpectomy with rad tx   Colon cancer ([place])    surgery with chemo and rad tx   Complication of  anesthesia    GERD (gastroesophageal reflux disease)    Heart palpitations    History of hiatal hernia    History of kidney stones    Hypothyroidism    Liver disease    Liver nodule    s/p negative biopsy   Malignant neoplasm of thyroid gland ([place]) 2002   s/p surgery and XRT   Osteoporosis    Other and unspecified hyperlipidemia    Palpitations    Personal history of chemotherapy    Personal history of malignant neoplasm of large intestine    carcinoma - cecum, s/p right laparoscopic colectomy - s/p chemotherapy and XRT   Personal history of radiation therapy    Pneumonia 2019   PONV (postoperative nausea and vomiting)    Pure hypercholesterolemia    Unspecified hereditary and idiopathic peripheral neuropathy     PAST SURGICAL HISTORY: Past Surgical History:  Procedure Laterality Date   APPENDECTOMY  1985   BREAST BIOPSY Right 2009   positive   BREAST BIOPSY Right 2009   negative   BREAST LUMPECTOMY Right 2009   breast cancer   CHOLECYSTECTOMY  1995   COLONOSCOPY     COLONOSCOPY WITH PROPOFOL N/A 10/29/2017   Procedure: COLONOSCOPY WITH PROPOFOL;  Surgeon: Manya Silvas, MD;  Location: [facility] ENDOSCOPY;  Service: Endoscopy;  Laterality: N/A;   DILATION AND CURETTAGE OF UTERUS  1990   DILATION AND CURETTAGE, DIAGNOSTIC / THERAPEUTIC  1990   ESOPHAGOGASTRODUODENOSCOPY     ESOPHAGOGASTRODUODENOSCOPY (EGD) WITH PROPOFOL N/A 10/29/2017   Procedure: ESOPHAGOGASTRODUODENOSCOPY (EGD) WITH PROPOFOL;  Surgeon: Manya Silvas, MD;  Location: [facility] ENDOSCOPY;  Service: Endoscopy;  Laterality: N/A;   INCISION AND DRAINAGE PERIRECTAL ABSCESS N/A 06/17/2020   Procedure: IRRIGATION AND DEBRIDEMENT PERIRECTAL ABSCESS;  Surgeon: Jules Husbands, MD;  Location: [facility] ORS;  Service: General;  Laterality: N/A;   IR BONE MARROW BIOPSY & ASPIRATION  07/09/2021   LAPAROSCOPIC PARTIAL COLECTOMY     stage 3-C carcinoma of the cecum, s/p chemotherapy and xrt   LITHOTRIPSY     SIGMOIDOSCOPY   08/26/1993   THYROID LOBECTOMY  2002   s/p XRT   TOTAL HIP ARTHROPLASTY Right 10/24/2019   Procedure: TOTAL HIP ARTHROPLASTY;  Surgeon: Corky Mull, MD;  Location: [facility] ORS;  Service: Orthopedics;  Laterality: Right;    FAMILY HISTORY: Family History  Problem Relation Age of Onset   Stroke Mother        60s   Alzheimer's disease Mother    Lung cancer Father    Prostate cancer Father    Cancer Father        Colon   Colon cancer Father    Breast cancer Sister        64's   Lung cancer Sister    Breast cancer Maternal Aunt     ADVANCED DIRECTIVES (Y/N):  N  HEALTH MAINTENANCE: Social History   Tobacco Use   Smoking status: Never   Smokeless tobacco: Never  Vaping Use   Vaping Use: Never used  Substance Use Topics   Alcohol use: No    Alcohol/week: 0.0 standard drinks   Drug use: No     Colonoscopy:  PAP:  Bone density:  Lipid panel:  Allergies  Allergen Reactions   Demeclocycline Other (See Comments)    Throat swells   Sulfa Antibiotics Other (See Comments)    Other reaction(s): Other (See Comments) Throat swells Other reaction(s): Other (See Comments) Throat swells   Tetracyclines & Related Other (See Comments)    Throat swells    Augmentin [Amoxicillin-Pot Clavulanate] Diarrhea   Bentyl [Dicyclomine Hcl]     unkn   Ciprofloxacin Diarrhea   Codeine Other (See Comments)    dizziness     Dicyclomine Hcl Other (See Comments)    unkn   Epinephrine     Speeds heart up - panic    Flagyl [Metronidazole] Nausea And Vomiting   Librax [Chlordiazepoxide-Clidinium]     unkn   Novocain [Procaine] Other (See Comments)    "Shaky"   Phenobarbital     unkn   Prednisone     Nervous     Ultram [Tramadol] Other (See Comments)    Sick feeling    Current Outpatient Medications  Medication Sig Dispense Refill   Azelastine-Fluticasone 137-50 MCG/ACT SUSP SPRAY 2 SPRAYS INTO EACH NOSTRIL EVERY DAY 23 g 3   clonazePAM (KLONOPIN) 0.5 MG tablet Take 1.5 mg by  mouth 2 (two) times daily.   0   diphenoxylate-atropine (LOMOTIL) 2.5-0.025 MG tablet Take 1 tablet by mouth 4 (four) times daily as needed for diarrhea or loose stools. 60 tablet 1   escitalopram (LEXAPRO) 20 MG tablet Take 20 mg by mouth every morning.      levofloxacin (LEVAQUIN) 500 MG tablet Take 1 tablet (500 mg total) by mouth daily. 30 tablet 1   levothyroxine (SYNTHROID) 125 MCG tablet TAKE 1 TABLET EVERY DAY ON EMPTY STOMACHWITH A GLASS OF WATER AT LEAST 30-60 MINBEFORE BREAKFAST 90 tablet 1   loperamide (IMODIUM) 2 MG capsule Take by mouth.     metoprolol tartrate (LOPRESSOR) 25 MG tablet TAKE 1 TABLET (25 MG  TOTAL) BY MOUTH AS NEEDED (AS NEEDED UP TO TWICE DAILY FOR PALPITATIONS). (Patient not taking: Reported on 07/29/2021) 60 tablet 1   ondansetron (ZOFRAN) 8 MG tablet Take 1 tablet (8 mg total) by mouth 2 (two) times daily as needed for nausea or vomiting. 20 tablet 0   oxyCODONE-acetaminophen (PERCOCET/ROXICET) 5-325 MG tablet Take 1 tablet by mouth every 6 (six) hours as needed for severe pain. (Patient not taking: Reported on 07/29/2021) 16 tablet 0   pantoprazole (PROTONIX) 40 MG tablet TAKE ONE TABLET (40 MG) BY MOUTH EVERY DAY 90 tablet 2   potassium chloride SA (KLOR-CON M) 20 MEQ tablet TAKE ONE (1) TABLET BY MOUTH TWO TIMES PER DAY 60 tablet 0   prochlorperazine (COMPAZINE) 10 MG tablet Take 1 tablet (10 mg total) by mouth every 6 (six) hours as needed for refractory nausea / vomiting. (Patient not taking: Reported on 07/29/2021) 30 tablet 0   valACYclovir (VALTREX) 500 MG tablet Take 1 tablet (500 mg total) by mouth daily. 90 tablet 3   venetoclax (VENCLEXTA) 100 MG tablet Take 4 tablets (400 mg total) by mouth daily. Take for 14 days, then hold for 14 days. Repeat every 28 days. Take with a meal and a full glass of water. 56 tablet 1   Current Facility-Administered Medications  Medication Dose Route Frequency Provider Last Rate Last Admin   ondansetron (ZOFRAN) tablet 8 mg  8  mg Oral Once Guse, Jacquelynn Cree, FNP       Facility-Administered Medications Ordered in Other Visits  Medication Dose Route Frequency Provider Last Rate Last Admin   potassium chloride (KLOR-CON) CR tablet 40 mEq  40 mEq Oral BID Lloyd Huger, MD   40 mEq at 07/08/20 1136    OBJECTIVE: There were no vitals filed for this visit.    There is no height or weight on file to calculate BMI.    ECOG FS:0 - Asymptomatic  General: Well-developed, well-nourished, no acute distress. Eyes: Pink conjunctiva, anicteric sclera. HEENT: Normocephalic, moist mucous membranes. Lungs: No audible wheezing or coughing. Heart: Regular rate and rhythm. Abdomen: Soft, nontender, no obvious distention. Musculoskeletal: No edema, cyanosis, or clubbing. Neuro: Alert, answering all questions appropriately. Cranial nerves grossly intact. Skin: No rashes or petechiae noted. Psych: Normal affect.  LAB RESULTS:  Lab Results  Component Value Date   NA 138 09/15/2021   K 3.4 (L) 09/15/2021   CL 107 09/15/2021   CO2 24 09/15/2021   GLUCOSE 85 09/15/2021   BUN 19 09/15/2021   CREATININE 1.05 (H) 09/15/2021   CALCIUM 7.8 (L) 09/15/2021   PROT 6.5 09/15/2021   ALBUMIN 3.8 09/15/2021   AST 15 09/15/2021   ALT 22 09/15/2021   ALKPHOS 115 09/15/2021   BILITOT 0.9 09/15/2021   GFRNONAA 57 (L) 09/15/2021   GFRAA 54 (L) 10/26/2019    Lab Results  Component Value Date   WBC 1.2 (LL) 09/15/2021   NEUTROABS 0.6 (L) 09/15/2021   HGB 12.9 09/15/2021   HCT 36.8 09/15/2021   MCV 85.2 09/15/2021   PLT 53 (L) 09/15/2021     STUDIES: No results found.   ASSESSMENT: AML.  PLAN:    1.  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  Patient completed cycle 1 of induction therapy at [facility] with Vidaza and venetoclax.  She only received 5 days of Vidaza for  cycle 2 recently which was interrupted secondary to significant diarrhea and a perirectal abscess.  Her most recent bone marrow biopsy on July 09, 2021 revealed an increased blast count to 5% suggesting slight progression of disease.  Case discussed with Dr. Marvel Plan at [facility] and plan is to add back in venetoclax with her next cycle of Vidaza.  Given her IDH 1 mutation, she may also benefit from an IDH inhibitor in the future.  Will continue maintenance treatment with Vidaza on days 1 through 5 every 28 days.  Proceed with cycle 15 of treatment this week.  Patient will also receive venetoclax 14 days on, 14 days off.  Return to clinic weekly for laboratory work and then in 4 weeks for further evaluation and consideration of cycle 16.  Appreciate clinical pharmacy input.     2.  Neutropenia: Chronic and unchanged.   3.  Thrombocytopenia: Resolved.   4.  Perirectal abscess: Resolved.   5.  Diarrhea: Patient states her diarrhea is intermittent.  She was given a prescription for Lomotil today.   6.  Supportive care: Patient previously was given referrals to home health and home palliative care.  Unclear if these services are still active.  Appreciate palliative care input.   7.  Hypokalemia: Patient has been instructed to reinitiate her oral potassium supplementation.  8.  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  Patient also received chemotherapy and likely received Adriamycin, but exact regimen is unknown.  She completed 5 years of hormonal therapy in approximately June 2015. Currently, she has no evidence of disease.  Her most recent mammogram on March 05, 2020 was reported as BI-RADS 1.  Patient has repeat mammogram scheduled for July 29, 2021.  Repeat in November 2022. 9.  History of colon cancer: Patient also states that she  received chemotherapy for this, possibly FOLFOX but again this is unknown.   Patient expressed understanding and was in agreement with this plan. She also understands that She can call clinic at any time with any questions, concerns, or complaints.    Cancer Staging  History of breast cancer Staging form: Breast, AJCC 7th Edition - Clinical stage from 01/07/2016: Stage IA (T1c, N0, M0) - Signed by Lloyd Huger, MD on 01/07/2016 Laterality: Right Estrogen receptor status: Positive Progesterone receptor status: Positive HER2 status: Negative   Lloyd Huger, MD   09/21/2021 6:30 AM

## 2021-09-22 ENCOUNTER — Inpatient Hospital Stay: Payer: PPO

## 2021-09-22 VITALS — BP 125/70 | HR 87 | Temp 97.5°F | Resp 18 | Wt 129.9 lb

## 2021-09-22 DIAGNOSIS — C9200 Acute myeloblastic leukemia, not having achieved remission: Secondary | ICD-10-CM

## 2021-09-22 DIAGNOSIS — Z5111 Encounter for antineoplastic chemotherapy: Secondary | ICD-10-CM | POA: Diagnosis not present

## 2021-09-22 LAB — COMPREHENSIVE METABOLIC PANEL
ALT: 22 U/L (ref 0–44)
AST: 15 U/L (ref 15–41)
Albumin: 3.7 g/dL (ref 3.5–5.0)
Alkaline Phosphatase: 111 U/L (ref 38–126)
Anion gap: 7 (ref 5–15)
BUN: 19 mg/dL (ref 8–23)
CO2: 27 mmol/L (ref 22–32)
Calcium: 8.2 mg/dL — ABNORMAL LOW (ref 8.9–10.3)
Chloride: 107 mmol/L (ref 98–111)
Creatinine, Ser: 0.92 mg/dL (ref 0.44–1.00)
GFR, Estimated: >60 mL/min (ref >60)
Glucose, Bld: 116 mg/dL — ABNORMAL HIGH (ref 70–99)
Potassium: 3.2 mmol/L — ABNORMAL LOW (ref 3.5–5.1)
Sodium: 141 mmol/L (ref 135–145)
Total Bilirubin: 1 mg/dL (ref 0.3–1.2)
Total Protein: 6.7 g/dL (ref 6.5–8.1)

## 2021-09-22 LAB — CBC WITH DIFFERENTIAL/PLATELET
Abs Immature Granulocytes: 0 10*3/uL (ref 0.00–0.07)
Basophils Absolute: 0 10*3/uL (ref 0.0–0.1)
Basophils Relative: 1 %
Eosinophils Absolute: 0 10*3/uL (ref 0.0–0.5)
Eosinophils Relative: 0 %
HCT: 37.4 % (ref 36.0–46.0)
Hemoglobin: 12.9 g/dL (ref 12.0–15.0)
Immature Granulocytes: 0 %
Lymphocytes Relative: 39 %
Lymphs Abs: 0.6 10*3/uL — ABNORMAL LOW (ref 0.7–4.0)
MCH: 29.3 pg (ref 26.0–34.0)
MCHC: 34.5 g/dL (ref 30.0–36.0)
MCV: 84.8 fL (ref 80.0–100.0)
Monocytes Absolute: 0.1 10*3/uL (ref 0.1–1.0)
Monocytes Relative: 9 %
Neutro Abs: 0.8 10*3/uL — ABNORMAL LOW (ref 1.7–7.7)
Neutrophils Relative %: 51 %
Platelets: 146 10*3/uL — ABNORMAL LOW (ref 150–400)
RBC: 4.41 MIL/uL (ref 3.87–5.11)
RDW: 15.9 % — ABNORMAL HIGH (ref 11.5–15.5)
WBC: 1.5 10*3/uL — ABNORMAL LOW (ref 4.0–10.5)
nRBC: 0 % (ref 0.0–0.2)

## 2021-09-22 MED ORDER — ONDANSETRON HCL 4 MG PO TABS
8.0000 mg | ORAL_TABLET | Freq: Once | ORAL | Status: AC
  Administered 2021-09-22: 8 mg via ORAL
  Filled 2021-09-22: qty 2

## 2021-09-22 MED ORDER — AZACITIDINE CHEMO SQ INJECTION
50.0000 mg/m2 | Freq: Once | INTRAMUSCULAR | Status: AC
  Administered 2021-09-22: 87.5 mg via SUBCUTANEOUS
  Filled 2021-09-22: qty 3.5

## 2021-09-22 NOTE — Progress Notes (Signed)
Potassium 3.2. Dr. [name] and Beckey Rutter, NP made aware. Per Ander Purpura, NP, patient to take oral supply of Potassium 20 mEq once a day. Patient informed of this and verbalizes understanding.

## 2021-09-22 NOTE — Patient Instructions (Signed)
[facility] CANCER CTR AT [place]  Discharge Instructions: Thank you for choosing [facility] to provide your oncology and hematology care.  If you have a lab appointment with the [facility], please go directly to the [facility] and check in at the registration area.  Wear comfortable clothing and clothing appropriate for easy access to any Portacath or PICC line.   We strive to give you quality time with your provider. You may need to reschedule your appointment if you arrive late (15 or more minutes).  Arriving late affects you and other patients whose appointments are after yours.  Also, if you miss three or more appointments without notifying the office, you may be dismissed from the clinic at the provider's discretion.      For prescription refill requests, have your pharmacy contact our office and allow 72 hours for refills to be completed.    Today you received the following chemotherapy and/or immunotherapy agents Vidaza      To help prevent nausea and vomiting after your treatment, we encourage you to take your nausea medication as directed.  BELOW ARE SYMPTOMS THAT SHOULD BE REPORTED IMMEDIATELY: *FEVER GREATER THAN 100.4 F (38 C) OR HIGHER *CHILLS OR SWEATING *NAUSEA AND VOMITING THAT IS NOT CONTROLLED WITH YOUR NAUSEA MEDICATION *UNUSUAL SHORTNESS OF BREATH *UNUSUAL BRUISING OR BLEEDING *URINARY PROBLEMS (pain or burning when urinating, or frequent urination) *BOWEL PROBLEMS (unusual diarrhea, constipation, pain near the anus) TENDERNESS IN MOUTH AND THROAT WITH OR WITHOUT PRESENCE OF ULCERS (sore throat, sores in mouth, or a toothache) UNUSUAL RASH, SWELLING OR PAIN  UNUSUAL VAGINAL DISCHARGE OR ITCHING   Items with * indicate a potential emergency and should be followed up as soon as possible or go to the Emergency Department if any problems should occur.  Please show the CHEMOTHERAPY ALERT CARD or IMMUNOTHERAPY ALERT CARD at check-in to the  Emergency Department and triage nurse.  Should you have questions after your visit or need to cancel or reschedule your appointment, please contact [facility] CANCER [place] AT [place]  [phone] and follow the prompts.  Office hours are 8:00 a.m. to 4:30 p.m. Monday - Friday. Please note that voicemails left after 4:00 p.m. may not be returned until the following business day.  We are closed weekends and major holidays. You have access to a nurse at all times for urgent questions. Please call the main number to the clinic [phone] and follow the prompts.  For any non-urgent questions, you may also contact your provider using MyChart. We now offer e-Visits for anyone 74 and older to request care online for non-urgent symptoms. For details visit [email or web address].   Also download the MyChart app! Go to the app store, search "MyChart", open the app, select [place], and log in with your MyChart username and password.  Due to Covid, a mask is required upon entering the hospital/clinic. If you do not have a mask, one will be given to you upon arrival. For doctor visits, patients may have 1 support person aged 71 or older with them. For treatment visits, patients cannot have anyone with them due to current Covid guidelines and our immunocompromised population.

## 2021-09-23 ENCOUNTER — Inpatient Hospital Stay: Payer: PPO

## 2021-09-23 ENCOUNTER — Ambulatory Visit (INDEPENDENT_AMBULATORY_CARE_PROVIDER_SITE_OTHER): Payer: PPO | Admitting: Internal Medicine

## 2021-09-23 ENCOUNTER — Encounter: Payer: Self-pay | Admitting: Internal Medicine

## 2021-09-23 ENCOUNTER — Encounter: Payer: Self-pay | Admitting: Oncology

## 2021-09-23 ENCOUNTER — Inpatient Hospital Stay: Payer: PPO | Admitting: Pharmacist

## 2021-09-23 ENCOUNTER — Inpatient Hospital Stay (HOSPITAL_BASED_OUTPATIENT_CLINIC_OR_DEPARTMENT_OTHER): Payer: PPO | Admitting: Oncology

## 2021-09-23 VITALS — BP 116/70 | HR 67 | Temp 98.7°F | Resp 17 | Ht 64.0 in | Wt 131.0 lb

## 2021-09-23 VITALS — BP 125/66 | HR 74 | Temp 97.4°F | Resp 18 | Wt 132.2 lb

## 2021-09-23 DIAGNOSIS — R7989 Other specified abnormal findings of blood chemistry: Secondary | ICD-10-CM

## 2021-09-23 DIAGNOSIS — E7800 Pure hypercholesterolemia, unspecified: Secondary | ICD-10-CM

## 2021-09-23 DIAGNOSIS — C9200 Acute myeloblastic leukemia, not having achieved remission: Secondary | ICD-10-CM

## 2021-09-23 DIAGNOSIS — F419 Anxiety disorder, unspecified: Secondary | ICD-10-CM | POA: Diagnosis not present

## 2021-09-23 DIAGNOSIS — F320 Major depressive disorder, single episode, mild: Secondary | ICD-10-CM

## 2021-09-23 DIAGNOSIS — G620 Drug-induced polyneuropathy: Secondary | ICD-10-CM | POA: Diagnosis not present

## 2021-09-23 DIAGNOSIS — E876 Hypokalemia: Secondary | ICD-10-CM

## 2021-09-23 DIAGNOSIS — E039 Hypothyroidism, unspecified: Secondary | ICD-10-CM | POA: Diagnosis not present

## 2021-09-23 DIAGNOSIS — F439 Reaction to severe stress, unspecified: Secondary | ICD-10-CM | POA: Diagnosis not present

## 2021-09-23 DIAGNOSIS — T451X5A Adverse effect of antineoplastic and immunosuppressive drugs, initial encounter: Secondary | ICD-10-CM

## 2021-09-23 DIAGNOSIS — M8580 Other specified disorders of bone density and structure, unspecified site: Secondary | ICD-10-CM | POA: Diagnosis not present

## 2021-09-23 DIAGNOSIS — Z5111 Encounter for antineoplastic chemotherapy: Secondary | ICD-10-CM | POA: Diagnosis not present

## 2021-09-23 DIAGNOSIS — D72819 Decreased white blood cell count, unspecified: Secondary | ICD-10-CM | POA: Diagnosis not present

## 2021-09-23 DIAGNOSIS — K219 Gastro-esophageal reflux disease without esophagitis: Secondary | ICD-10-CM

## 2021-09-23 DIAGNOSIS — Z85850 Personal history of malignant neoplasm of thyroid: Secondary | ICD-10-CM

## 2021-09-23 MED ORDER — ONDANSETRON HCL 4 MG PO TABS
8.0000 mg | ORAL_TABLET | Freq: Once | ORAL | Status: AC
  Administered 2021-09-23: 8 mg via ORAL
  Filled 2021-09-23: qty 2

## 2021-09-23 MED ORDER — AZACITIDINE CHEMO SQ INJECTION
50.0000 mg/m2 | Freq: Once | INTRAMUSCULAR | Status: AC
  Administered 2021-09-23: 87.5 mg via SUBCUTANEOUS
  Filled 2021-09-23: qty 3.5

## 2021-09-23 NOTE — Progress Notes (Unsigned)
Patient denies any concerns today.  

## 2021-09-23 NOTE — Progress Notes (Signed)
Patient ID: Debra Hale, female   DOB: Oct 06, 1950, 71 y.o.   MRN: [id number]   Subjective:    Patient ID: Debra Hale, female    DOB: [birth year], 71 y.o.   MRN: [id number]   Patient here for a scheduled follow up.   Chief Complaint  Patient presents with   Follow-up   Depression   .   HPI AML.  Seeing oncology.  Receiving vidaza and venetoclax.  Has chronic weakness and fatigue.  Increased stress.  Discussed.  Does not feel needs any further intervention at this time.  No chest pain.  Breathing stable.  No cough or congestion.  No acid reflux reported.  No abdominal pain.     Past Medical History:  Diagnosis Date   Anxiety    Arthritis    Osteoarthritis   Breast cancer ([place]) 2009   right breast lumpectomy with rad tx   Colon cancer ([place])    surgery with chemo and rad tx   Complication of anesthesia    GERD (gastroesophageal reflux disease)    Heart palpitations    History of hiatal hernia    History of kidney stones    Hypothyroidism    Liver disease    Liver nodule    s/p negative biopsy   Malignant neoplasm of thyroid gland ([place]) 2002   s/p surgery and XRT   Osteoporosis    Other and unspecified hyperlipidemia    Palpitations    Personal history of chemotherapy    Personal history of malignant neoplasm of large intestine    carcinoma - cecum, s/p right laparoscopic colectomy - s/p chemotherapy and XRT   Personal history of radiation therapy    Pneumonia 2019   PONV (postoperative nausea and vomiting)    Pure hypercholesterolemia    Unspecified hereditary and idiopathic peripheral neuropathy    Past Surgical History:  Procedure Laterality Date   APPENDECTOMY  1985   BREAST BIOPSY Right 2009   positive   BREAST BIOPSY Right 2009   negative   BREAST LUMPECTOMY Right 2009   breast cancer   CHOLECYSTECTOMY  1995   COLONOSCOPY     COLONOSCOPY WITH PROPOFOL N/A 10/29/2017   Procedure: COLONOSCOPY WITH PROPOFOL;  Surgeon: Manya Silvas, MD;  Location:  [facility] ENDOSCOPY;  Service: Endoscopy;  Laterality: N/A;   DILATION AND CURETTAGE OF UTERUS  1990   DILATION AND CURETTAGE, DIAGNOSTIC / THERAPEUTIC  1990   ESOPHAGOGASTRODUODENOSCOPY     ESOPHAGOGASTRODUODENOSCOPY (EGD) WITH PROPOFOL N/A 10/29/2017   Procedure: ESOPHAGOGASTRODUODENOSCOPY (EGD) WITH PROPOFOL;  Surgeon: Manya Silvas, MD;  Location: [facility] ENDOSCOPY;  Service: Endoscopy;  Laterality: N/A;   INCISION AND DRAINAGE PERIRECTAL ABSCESS N/A 06/17/2020   Procedure: IRRIGATION AND DEBRIDEMENT PERIRECTAL ABSCESS;  Surgeon: Jules Husbands, MD;  Location: [facility] ORS;  Service: General;  Laterality: N/A;   IR BONE MARROW BIOPSY & ASPIRATION  07/09/2021   LAPAROSCOPIC PARTIAL COLECTOMY     stage 3-C carcinoma of the cecum, s/p chemotherapy and xrt   LITHOTRIPSY     SIGMOIDOSCOPY  08/26/1993   THYROID LOBECTOMY  2002   s/p XRT   TOTAL HIP ARTHROPLASTY Right 10/24/2019   Procedure: TOTAL HIP ARTHROPLASTY;  Surgeon: Corky Mull, MD;  Location: [facility] ORS;  Service: Orthopedics;  Laterality: Right;   Family History  Problem Relation Age of Onset   Stroke Mother        46s   Alzheimer's disease Mother    Lung cancer Father  Prostate cancer Father    Cancer Father        Colon   Colon cancer Father    Breast cancer Sister        42's   Lung cancer Sister    Breast cancer Maternal Aunt    Social History   Socioeconomic History   Marital status: Single    Spouse name: Not on file   Number of children: 0   Years of education: Not on file   Highest education level: Not on file  Occupational History   Not on file  Tobacco Use   Smoking status: Never   Smokeless tobacco: Never  Vaping Use   Vaping Use: Never used  Substance and Sexual Activity   Alcohol use: No    Alcohol/week: 0.0 standard drinks   Drug use: No   Sexual activity: Not Currently  Other Topics Concern   Not on file  Social History Narrative   Not on file   Social Determinants of Health   Financial Resource  Strain: Low Risk    Difficulty of Paying Living Expenses: Not very hard  Food Insecurity: No Food Insecurity   Worried About Running Out of Food in the Last Year: Never true   Ran Out of Food in the Last Year: Never true  Transportation Needs: No Transportation Needs   Lack of Transportation (Medical): No   Lack of Transportation (Non-Medical): No  Physical Activity: Insufficiently Active   Days of Exercise per Week: 3 days   Minutes of Exercise per Session: 10 min  Stress: No Stress Concern Present   Feeling of Stress : Not at all  Social Connections: Socially Isolated   Frequency of Communication with Friends and Family: Once a week   Frequency of Social Gatherings with Friends and Family: Once a week   Attends Religious Services: Never   Marine scientist or Organizations: No   Attends Archivist Meetings: Never   Marital Status: Divorced     Review of Systems  Constitutional:  Negative for appetite change and unexpected weight change.  HENT:  Negative for congestion and sinus pressure.   Respiratory:  Negative for cough, chest tightness and shortness of breath.   Cardiovascular:  Negative for chest pain, palpitations and leg swelling.  Gastrointestinal:  Negative for abdominal pain, nausea and vomiting.  Genitourinary:  Negative for difficulty urinating and dysuria.  Musculoskeletal:  Negative for joint swelling and myalgias.  Skin:  Negative for color change and rash.  Neurological:  Negative for dizziness, light-headedness and headaches.  Psychiatric/Behavioral:  Negative for agitation and dysphoric mood.        Increased stress as outlined.       Objective:     BP 116/70 (BP Location: Left Arm, Patient Position: Sitting, Cuff Size: Small)   Pulse 67   Temp 98.7 F (37.1 C) (Temporal)   Resp 17   Ht 5' 4"  (1.626 m)   Wt 131 lb (59.4 kg)   SpO2 99%   BMI 22.49 kg/m  Wt Readings from Last 3 Encounters:  09/23/21 131 lb (59.4 kg)  09/23/21 132 lb  3.2 oz (60 kg)  09/22/21 129 lb 13.6 oz (58.9 kg)    Physical Exam Vitals reviewed.  Constitutional:      General: She is not in acute distress.    Appearance: Normal appearance.  HENT:     Head: Normocephalic and atraumatic.     Right Ear: External ear normal.  Left Ear: External ear normal.  Eyes:     General: No scleral icterus.       Right eye: No discharge.        Left eye: No discharge.     Conjunctiva/sclera: Conjunctivae normal.  Neck:     Thyroid: No thyromegaly.  Cardiovascular:     Rate and Rhythm: Normal rate and regular rhythm.  Pulmonary:     Effort: No respiratory distress.     Breath sounds: Normal breath sounds. No wheezing.  Abdominal:     General: Bowel sounds are normal.     Palpations: Abdomen is soft.     Tenderness: There is no abdominal tenderness.  Musculoskeletal:        General: No swelling or tenderness.     Cervical back: Neck supple. No tenderness.  Lymphadenopathy:     Cervical: No cervical adenopathy.  Skin:    Findings: No erythema or rash.  Neurological:     Mental Status: She is alert.  Psychiatric:        Mood and Affect: Mood normal.        Behavior: Behavior normal.     Outpatient Encounter Medications as of 09/23/2021  Medication Sig   Azelastine-Fluticasone 137-50 MCG/ACT SUSP SPRAY 2 SPRAYS INTO EACH NOSTRIL EVERY DAY   clonazePAM (KLONOPIN) 0.5 MG tablet Take 1.5 mg by mouth 2 (two) times daily.    diphenoxylate-atropine (LOMOTIL) 2.5-0.025 MG tablet Take 1 tablet by mouth 4 (four) times daily as needed for diarrhea or loose stools.   escitalopram (LEXAPRO) 20 MG tablet Take 20 mg by mouth every morning.    levofloxacin (LEVAQUIN) 500 MG tablet Take 1 tablet (500 mg total) by mouth daily.   levothyroxine (SYNTHROID) 125 MCG tablet TAKE 1 TABLET EVERY DAY ON EMPTY STOMACHWITH A GLASS OF WATER AT LEAST 30-60 MINBEFORE BREAKFAST   loperamide (IMODIUM) 2 MG capsule Take by mouth.   metoprolol tartrate (LOPRESSOR) 25 MG  tablet TAKE 1 TABLET (25 MG TOTAL) BY MOUTH AS NEEDED (AS NEEDED UP TO TWICE DAILY FOR PALPITATIONS).   ondansetron (ZOFRAN) 8 MG tablet Take 1 tablet (8 mg total) by mouth 2 (two) times daily as needed for nausea or vomiting.   pantoprazole (PROTONIX) 40 MG tablet TAKE ONE TABLET (40 MG) BY MOUTH EVERY DAY   potassium chloride SA (KLOR-CON M) 20 MEQ tablet TAKE ONE (1) TABLET BY MOUTH TWO TIMES PER DAY   valACYclovir (VALTREX) 500 MG tablet Take 1 tablet (500 mg total) by mouth daily.   venetoclax (VENCLEXTA) 100 MG tablet Take 4 tablets (400 mg total) by mouth daily. Take for 14 days, then hold for 14 days. Repeat every 28 days. Take with a meal and a full glass of water.   [DISCONTINUED] oxyCODONE-acetaminophen (PERCOCET/ROXICET) 5-325 MG tablet Take 1 tablet by mouth every 6 (six) hours as needed for severe pain. (Patient not taking: Reported on 07/29/2021)   [DISCONTINUED] prochlorperazine (COMPAZINE) 10 MG tablet Take 1 tablet (10 mg total) by mouth every 6 (six) hours as needed for refractory nausea / vomiting. (Patient not taking: Reported on 07/29/2021)   Facility-Administered Encounter Medications as of 09/23/2021  Medication   [COMPLETED] azaCITIDine (VIDAZA) chemo injection 87.5 mg   ondansetron (ZOFRAN) tablet 8 mg   potassium chloride (KLOR-CON) CR tablet 40 mEq     Lab Results  Component Value Date   WBC 1.1 (LL) 09/26/2021   HGB 12.0 09/26/2021   HCT 35.6 (L) 09/26/2021   PLT 138 (L) 09/26/2021  GLUCOSE 111 (H) 09/26/2021   CHOL 163 02/20/2020   TRIG 110.0 02/20/2020   HDL 40.70 02/20/2020   LDLDIRECT 126.0 07/11/2015   LDLCALC 100 (H) 02/20/2020   ALT 19 09/26/2021   AST 11 (L) 09/26/2021   NA 140 09/26/2021   K 4.1 09/26/2021   K 4.2 09/26/2021   CL 107 09/26/2021   CREATININE 0.97 09/26/2021   BUN 17 09/26/2021   CO2 28 09/26/2021   TSH 0.034 (L) 10/02/2020   HGBA1C 5.3 12/10/2016    DG Bone Density  Result Date: 07/29/2021 EXAM: DUAL X-RAY ABSORPTIOMETRY  (DXA) FOR BONE MINERAL DENSITY IMPRESSION: Dear Dr. Nicki Reaper, Your patient Debra Hale completed a FRAX assessment on 07/29/2021 using the [place] (analysis version: 14.10) manufactured by [employer]. The following summarizes the results of our evaluation. PATIENT BIOGRAPHICAL: Name: Debra Hale, Debra Hale Patient ID: [id number] Birth Date: 11-12-1950 Height:    64.0 in. Gender:     Female    [age]       Weight:    128.8 lbs. Ethnicity:  White                            Exam Date: 07/29/2021 FRAX* RESULTS:  (version: 3.5) 10-year Probability of Fracture1 Major Osteoporotic Fracture2 Hip Fracture 12.2% 2.9% Population: [place] (Caucasian) Risk Factors: None Based on Femur (Left) Neck BMD 1 -The 10-year probability of fracture may be lower than reported if the patient has received treatment. 2 -Major Osteoporotic Fracture: Clinical Spine, Forearm, Hip or Shoulder *FRAX is a Materials engineer of the [employer] for Metabolic Bone Disease, a [place] (WHO) [facility]. ASSESSMENT: The probability of a major osteoporotic fracture is 12.2% within the next ten years. The probability of a hip fracture is 2.9% within the next ten years. . Your patient Debra Hale completed a BMD test on 07/29/2021 using the [place] (software version: 14.10) manufactured by [employer]. The following summarizes the results of our evaluation. Technologist: [facility] PATIENT BIOGRAPHICAL: Name: Debra Hale, Debra Hale Patient ID: [id number] Birth Date: 1950/06/07 Height: 64.0 in. Gender: Female Exam Date: 07/29/2021 Weight: 128.8 lbs. Indications: Advanced Age, Caucasian, History of Breast Cancer, History of Chemo, History of colon cancer, History of Radiation, History of thyroid cancer, Leukemia, Postmenopausal, Previous Chemo and Radiation, right hip replacement Fractures: Treatments: Synthroid DENSITOMETRY RESULTS: Site         Region     Measured Date  Measured Age WHO Classification Young Adult T-score BMD         %Change vs. Previous Significant Change (*) AP Spine L1-L3 07/29/2020 71.7 Osteopenia -1.9 0.949 g/cm2 -9.0% Yes AP Spine L1-L3 12/08/2011 62.0 Osteopenia -1.1 1.043 g/cm2 - - Left Femur Total 07/29/2021 71.7 Osteopenia -2.1 0.739 g/cm2 -9.3% Yes Left Femur Total 12/08/2011 62.0 Osteopenia -1.5 0.815 g/cm2 - - Left Forearm Radius 33% 07/29/2021 71.7 Osteopenia -2.4 0.663 g/cm2 -4.6% - Left Forearm Radius 33% 12/08/2011 62.0 Osteopenia -2.1 0.695 g/cm2 - - ASSESSMENT: The BMD measured at Forearm Radius 33% is 0.663 g/cm2 with a T-score of -2.4. This patient is considered osteopenic according to [facility]) criteria. The scan quality is good. L-4 was excluded due to degenerative changes. Compared with prior study, there has been significant decrease in the spine. World Health Organization [facility]) criteria for post-menopausal, Caucasian Women: Normal:  T-score at or above -1 SD Osteopenia/low bone mass: T-score between -1 and -2.5 SD Osteoporosis:             T-score at or below -2.5 SD RECOMMENDATIONS: 1. All patients should optimize calcium and vitamin D intake. 2. Consider FDA-approved medical therapies in postmenopausal women and men aged 58 years and older, based on the following: a. A hip or vertebral(clinical or morphometric) fracture b. T-score < -2.5 at the femoral neck or spine after appropriate evaluation to exclude secondary causes c. Low bone mass (T-score between -1.0 and -2.5 at the femoral neck or spine) and a 10-year probability of a hip fracture > 3% or a 10-year probability of a major osteoporosis-related fracture > 20% based on the [place]-adapted WHO algorithm 3. Clinician judgment and/or patient preferences may indicate treatment for people with 10-year fracture probabilities above or below these levels FOLLOW-UP: People with diagnosed cases of osteoporosis or at high risk for fracture should have regular  bone mineral density tests. For patients eligible for Medicare, routine testing is allowed once every 2 years. The testing frequency can be increased to one year for patients who have rapidly progressing disease, those who are receiving or discontinuing medical therapy to restore bone mass, or have additional risk factors. I have reviewed this report, and agree with the above findings. Mark A. Thornton Papas, M.D. [facility] Electronically Signed   By: Lavonia Dana M.D.   On: 07/29/2021 15:21   MM 3D SCREEN BREAST BILATERAL  Result Date: 07/30/2021 CLINICAL DATA:  Screening. EXAM: DIGITAL SCREENING BILATERAL MAMMOGRAM WITH TOMOSYNTHESIS AND CAD TECHNIQUE: Bilateral screening digital craniocaudal and mediolateral oblique mammograms were obtained. Bilateral screening digital breast tomosynthesis was performed. The images were evaluated with computer-aided detection. COMPARISON:  Previous exam(s). ACR Breast Density Category c: The breast tissue is heterogeneously dense, which may obscure small masses. FINDINGS: There are no findings suspicious for malignancy. RIGHT breast surgical changes again noted. IMPRESSION: No mammographic evidence of malignancy. A result letter of this screening mammogram will be mailed directly to the patient. RECOMMENDATION: Screening mammogram in one year. (Code:SM-B-01Y) BI-RADS CATEGORY  2: Benign. Electronically Signed   By: Margarette [place] M.D.   On: 07/30/2021 09:19       Assessment & Plan:   Problem List Items Addressed This Visit     Abnormal liver function test    Follow liver function tests.         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         Anxiety    Continue lexapro.  Continue f/u with Dr Clovis Riley        Chemotherapy-induced neuropathy ([place])    Stable.         GERD (gastroesophageal reflux disease)    No upper symptoms reported. On protonix.        History  of thyroid cancer    Follow tsh.        Hypercholesterolemia - Primary    Follow lipid panel.        Relevant Orders   Lipid Profile   Hypothyroidism    On thyroid replacement.  Follow tsh.        Relevant Orders   TSH   Leukopenia    Being followed by hematology.        Major depressive disorder, single episode, mild (HCC)    Seeing Dr Clovis Riley.  Currently on lexapro  and clonazepam.  Denies SI.  Follow.  Continue f/u with psychiatry.        Osteopenia    Bone density reveals osteopenia. Discussed bone density results.  Discussed treatment.  Continue calcium, weight bearing exercise and vitamin D.         Stress    Increased stress with her current medical issues.  Seeing Dr Clovis Riley.  Follow.  Notify me if feels needs any further intervention.         Other Visit Diagnoses     Hypokalemia       Relevant Orders   Basic Metabolic Panel (BMET)        [name], MD

## 2021-09-23 NOTE — Patient Instructions (Signed)
[facility] [place]  Discharge Instructions: Thank you for choosing [facility] to provide your oncology and hematology care.   If you have a lab appointment with the [facility], please go directly to the [facility] and check in at the registration area.   Wear comfortable clothing and clothing appropriate for easy access to any Portacath or PICC line.   We strive to give you quality time with your provider. You may need to reschedule your appointment if you arrive late (15 or more minutes).  Arriving late affects you and other patients whose appointments are after yours.  Also, if you miss three or more appointments without notifying the office, you may be dismissed from the clinic at the provider's discretion.      For prescription refill requests, have your pharmacy contact our office and allow 72 hours for refills to be completed.    Today you received the following chemotherapy and/or immunotherapy agents: Vidaza      To help prevent nausea and vomiting after your treatment, we encourage you to take your nausea medication as directed.  BELOW ARE SYMPTOMS THAT SHOULD BE REPORTED IMMEDIATELY: *FEVER GREATER THAN 100.4 F (38 C) OR HIGHER *CHILLS OR SWEATING *NAUSEA AND VOMITING THAT IS NOT CONTROLLED WITH YOUR NAUSEA MEDICATION *UNUSUAL SHORTNESS OF BREATH *UNUSUAL BRUISING OR BLEEDING *URINARY PROBLEMS (pain or burning when urinating, or frequent urination) *BOWEL PROBLEMS (unusual diarrhea, constipation, pain near the anus) TENDERNESS IN MOUTH AND THROAT WITH OR WITHOUT PRESENCE OF ULCERS (sore throat, sores in mouth, or a toothache) UNUSUAL RASH, SWELLING OR PAIN  UNUSUAL VAGINAL DISCHARGE OR ITCHING   Items with * indicate a potential emergency and should be followed up as soon as possible or go to the Emergency Department if any problems should occur.  Please show the CHEMOTHERAPY ALERT CARD or IMMUNOTHERAPY ALERT CARD at check-in to  the Emergency Department and triage nurse.  Should you have questions after your visit or need to cancel or reschedule your appointment, please contact [facility] [place]  Dept: [phone]  and follow the prompts.  Office hours are 8:00 a.m. to 4:30 p.m. Monday - Friday. Please note that voicemails left after 4:00 p.m. may not be returned until the following business day.  We are closed weekends and major holidays. You have access to a nurse at all times for urgent questions. Please call the main number to the clinic Dept: [phone] and follow the prompts.   For any non-urgent questions, you may also contact your provider using MyChart. We now offer e-Visits for anyone 72 and older to request care online for non-urgent symptoms. For details visit [email or web address].   Also download the MyChart app! Go to the app store, search "MyChart", open the app, select [facility], and log in with your MyChart username and password.  Due to Covid, a mask is required upon entering the hospital/clinic. If you do not have a mask, one will be given to you upon arrival. For doctor visits, patients may have 1 support person aged 4 or older with them. For treatment visits, patients cannot have anyone with them due to current Covid guidelines and our immunocompromised population.

## 2021-09-23 NOTE — Progress Notes (Signed)
[facility]  [phone] [phone]  Patient Care Team: [name], MD as PCP - General (Internal Medicine) Wellington Hampshire, MD as PCP - Cardiology (Cardiology)   Name of the patient: Debra Hale  [id number]  December 23, 1950   Date of visit: 09/23/21  HPI: Patient is a 71 y.o. 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 Most recent bone marrow biopsy on 07/09/21 revealed an increased blast count to 5%. Patient was reinitiating venetoclax 14 on/14 off on 08/26/21.  Reason for Consult: Oral chemotherapy follow-up for venetoclax therapy.   PAST MEDICAL HISTORY: Past Medical History:  Diagnosis Date   Anxiety    Arthritis    Osteoarthritis   Breast cancer ([place]) 2009   right breast lumpectomy with rad tx   Colon cancer ([place])    surgery with chemo and rad tx   Complication of anesthesia    GERD (gastroesophageal reflux disease)    Heart palpitations    History of hiatal hernia    History of kidney stones    Hypothyroidism    Liver disease    Liver nodule    s/p negative biopsy   Malignant neoplasm of thyroid gland ([place]) 2002   s/p surgery and XRT   Osteoporosis    Other and unspecified hyperlipidemia    Palpitations    Personal history of chemotherapy    Personal history of malignant neoplasm of large intestine    carcinoma - cecum, s/p right laparoscopic colectomy - s/p chemotherapy and XRT   Personal history of radiation therapy    Pneumonia 2019   PONV (postoperative nausea and vomiting)    Pure hypercholesterolemia    Unspecified hereditary and idiopathic peripheral neuropathy     HEMATOLOGY/ONCOLOGY HISTORY:  Oncology History  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[place])  04/12/2020 Initial Diagnosis   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[place])    06/10/2020 -  Chemotherapy   Patient is on Treatment Plan : AML dose reduced azacitidine SQ D1-5 q28d        ALLERGIES:  is allergic to  demeclocycline, sulfa antibiotics, tetracyclines & related, augmentin [amoxicillin-pot clavulanate], bentyl [dicyclomine hcl], ciprofloxacin, codeine, dicyclomine hcl, epinephrine, flagyl [metronidazole], librax [chlordiazepoxide-clidinium], novocain [procaine], phenobarbital, prednisone, and ultram [tramadol].  MEDICATIONS:  Current Outpatient Medications  Medication Sig Dispense Refill   Azelastine-Fluticasone 137-50 MCG/ACT SUSP SPRAY 2 SPRAYS INTO EACH NOSTRIL EVERY DAY 23 g 3   clonazePAM (KLONOPIN) 0.5 MG tablet Take 1.5 mg by mouth 2 (two) times daily.   0   diphenoxylate-atropine (LOMOTIL) 2.5-0.025 MG tablet Take 1 tablet by mouth 4 (four) times daily as needed for diarrhea or loose stools. 60 tablet 1   escitalopram (LEXAPRO) 20 MG tablet Take 20 mg by mouth every morning.      levofloxacin (LEVAQUIN) 500 MG tablet Take 1 tablet (500 mg total) by mouth daily. 30 tablet 1   levothyroxine (SYNTHROID) 125 MCG tablet TAKE 1 TABLET EVERY DAY ON EMPTY STOMACHWITH A GLASS OF WATER AT LEAST 30-60 MINBEFORE BREAKFAST 90 tablet 1   loperamide (IMODIUM) 2 MG capsule Take by mouth.     metoprolol tartrate (LOPRESSOR) 25 MG tablet TAKE 1 TABLET (25 MG TOTAL) BY MOUTH AS NEEDED (AS NEEDED UP TO TWICE DAILY FOR PALPITATIONS). 60 tablet 1   ondansetron (ZOFRAN) 8 MG tablet Take 1 tablet (8 mg total) by mouth 2 (two) times daily as needed for nausea or vomiting. 20 tablet 0   pantoprazole (PROTONIX) 40 MG tablet TAKE ONE TABLET (40  MG) BY MOUTH EVERY DAY 90 tablet 2   potassium chloride SA (KLOR-CON M) 20 MEQ tablet TAKE ONE (1) TABLET BY MOUTH TWO TIMES PER DAY 60 tablet 0   valACYclovir (VALTREX) 500 MG tablet Take 1 tablet (500 mg total) by mouth daily. 90 tablet 3   venetoclax (VENCLEXTA) 100 MG tablet Take 4 tablets (400 mg total) by mouth daily. Take for 14 days, then hold for 14 days. Repeat every 28 days. Take with a meal and a full glass of water. 56 tablet 1   Current Facility-Administered  Medications  Medication Dose Route Frequency Provider Last Rate Last Admin   ondansetron (ZOFRAN) tablet 8 mg  8 mg Oral Once Guse, Jacquelynn Cree, FNP       Facility-Administered Medications Ordered in Other Visits  Medication Dose Route Frequency Provider Last Rate Last Admin   potassium chloride (KLOR-CON) CR tablet 40 mEq  40 mEq Oral BID Lloyd Huger, MD   40 mEq at 07/08/20 1136    VITAL SIGNS: There were no vitals taken for this visit. There were no vitals filed for this visit.  Estimated body mass index is 22.49 kg/m as calculated from the following:   Height as of an earlier encounter on 09/23/21: 5' 4"  (1.626 m).   Weight as of an earlier encounter on 09/23/21: 59.4 kg (131 lb).  LABS: CBC:    Component Value Date/Time   WBC 1.5 (L) 09/22/2021 1304   HGB 12.9 09/22/2021 1304   HGB 12.5 05/09/2018 1147   HCT 37.4 09/22/2021 1304   HCT 36.8 05/09/2018 1147   PLT 146 (L) 09/22/2021 1304   PLT 285 05/09/2018 1147   MCV 84.8 09/22/2021 1304   MCV 86 05/09/2018 1147   MCV 89 11/30/2013 1419   NEUTROABS 0.8 (L) 09/22/2021 1304   NEUTROABS 2.9 11/20/2013 1404   LYMPHSABS 0.6 (L) 09/22/2021 1304   LYMPHSABS 0.9 (L) 11/20/2013 1404   MONOABS 0.1 09/22/2021 1304   MONOABS 0.2 11/20/2013 1404   EOSABS 0.0 09/22/2021 1304   EOSABS 0.1 11/20/2013 1404   BASOSABS 0.0 09/22/2021 1304   BASOSABS 0.0 11/20/2013 1404   Comprehensive Metabolic Panel:    Component Value Date/Time   NA 141 09/22/2021 1304   NA 143 05/09/2018 1147   NA 145 11/30/2013 1419   K 3.2 (L) 09/22/2021 1304   K 3.5 11/30/2013 1419   CL 107 09/22/2021 1304   CL 109 (H) 11/30/2013 1419   CO2 27 09/22/2021 1304   CO2 29 11/30/2013 1419   BUN 19 09/22/2021 1304   BUN 11 05/09/2018 1147   BUN 15 11/30/2013 1419   CREATININE 0.92 09/22/2021 1304   CREATININE 1.04 11/30/2013 1419   GLUCOSE 116 (H) 09/22/2021 1304   GLUCOSE 97 11/30/2013 1419   CALCIUM 8.2 (L) 09/22/2021 1304   CALCIUM 8.7 11/30/2013  1419   AST 15 09/22/2021 1304   AST 28 11/30/2013 1419   ALT 22 09/22/2021 1304   ALT 71 (H) 11/30/2013 1419   ALKPHOS 111 09/22/2021 1304   ALKPHOS 114 11/30/2013 1419   BILITOT 1.0 09/22/2021 1304   BILITOT 0.7 11/30/2013 1419   PROT 6.7 09/22/2021 1304   PROT 7.1 11/30/2013 1419   ALBUMIN 3.7 09/22/2021 1304   ALBUMIN 3.3 (L) 11/30/2013 1419     Present during today's visit: patient only  Assessment and Plan: Continue venetoclax 457m 14on/14 off ANC had improved to >0.5, patient was told she could hold her prophylactic levofloxacin, she report  she has already stopped it Hypokalemia: addressed yesterday by MD and NP   Oral Chemotherapy Side Effect/Intolerance:  Bowel movement: patient has had trouble with both constipation and diarrhea, but today her bowel movements are normal Fatigue: mild, she has good days and days were she feels more tired No reported edema, rash, nausea  Oral Chemotherapy Adherence: no missed doses reported No patient barriers to medication adherence identified.   New medications: none reported  Medication Access Issues: no issues, patient fills at [facility]  Patient expressed understanding and was in agreement with this plan. She also understands that She can call clinic at any time with any questions, concerns, or complaints.   Follow-up plan: RTC in 4 weeks  Thank you for allowing me to participate in the care of this very pleasant patient.   Time Total: 15 mins  Visit consisted of counseling and education on dealing with issues of symptom management in the setting of serious and potentially life-threatening illness.Greater than 50%  of this time was spent counseling and coordinating care related to the above assessment and plan.  Signed by: Darl Pikes, PharmD, BCPS, Salley Slaughter, CPP Hematology/Oncology Clinical Pharmacist Practitioner [place]/DB/AP [facility] [phone]  09/23/2021 4:19 PM

## 2021-09-24 ENCOUNTER — Encounter: Payer: Self-pay | Admitting: Oncology

## 2021-09-24 ENCOUNTER — Inpatient Hospital Stay: Payer: PPO

## 2021-09-24 VITALS — BP 113/62 | HR 66 | Temp 97.0°F | Resp 20

## 2021-09-24 DIAGNOSIS — C9200 Acute myeloblastic leukemia, not having achieved remission: Secondary | ICD-10-CM

## 2021-09-24 DIAGNOSIS — Z5111 Encounter for antineoplastic chemotherapy: Secondary | ICD-10-CM | POA: Diagnosis not present

## 2021-09-24 MED ORDER — ONDANSETRON HCL 4 MG PO TABS
8.0000 mg | ORAL_TABLET | Freq: Once | ORAL | Status: AC
  Administered 2021-09-24: 8 mg via ORAL
  Filled 2021-09-24: qty 2

## 2021-09-24 MED ORDER — AZACITIDINE CHEMO SQ INJECTION
50.0000 mg/m2 | Freq: Once | INTRAMUSCULAR | Status: AC
  Administered 2021-09-24: 87.5 mg via SUBCUTANEOUS
  Filled 2021-09-24: qty 3.5

## 2021-09-25 ENCOUNTER — Inpatient Hospital Stay: Payer: PPO

## 2021-09-25 VITALS — BP 148/69 | HR 66 | Temp 97.2°F | Resp 16

## 2021-09-25 DIAGNOSIS — C9200 Acute myeloblastic leukemia, not having achieved remission: Secondary | ICD-10-CM

## 2021-09-25 DIAGNOSIS — Z5111 Encounter for antineoplastic chemotherapy: Secondary | ICD-10-CM | POA: Diagnosis not present

## 2021-09-25 MED ORDER — ONDANSETRON HCL 4 MG PO TABS
8.0000 mg | ORAL_TABLET | Freq: Once | ORAL | Status: AC
  Administered 2021-09-25: 8 mg via ORAL
  Filled 2021-09-25: qty 2

## 2021-09-25 MED ORDER — AZACITIDINE CHEMO SQ INJECTION
50.0000 mg/m2 | Freq: Once | INTRAMUSCULAR | Status: AC
  Administered 2021-09-25: 87.5 mg via SUBCUTANEOUS
  Filled 2021-09-25: qty 3.5

## 2021-09-26 ENCOUNTER — Inpatient Hospital Stay: Payer: PPO

## 2021-09-26 ENCOUNTER — Other Ambulatory Visit: Payer: PPO

## 2021-09-26 VITALS — BP 116/62 | HR 68 | Temp 97.0°F | Resp 18

## 2021-09-26 DIAGNOSIS — C9200 Acute myeloblastic leukemia, not having achieved remission: Secondary | ICD-10-CM

## 2021-09-26 DIAGNOSIS — Z5111 Encounter for antineoplastic chemotherapy: Secondary | ICD-10-CM | POA: Diagnosis not present

## 2021-09-26 DIAGNOSIS — E876 Hypokalemia: Secondary | ICD-10-CM

## 2021-09-26 LAB — CBC WITH DIFFERENTIAL/PLATELET
Abs Immature Granulocytes: 0 10*3/uL (ref 0.00–0.07)
Basophils Absolute: 0 10*3/uL (ref 0.0–0.1)
Basophils Relative: 0 %
Eosinophils Absolute: 0 10*3/uL (ref 0.0–0.5)
Eosinophils Relative: 1 %
HCT: 35.6 % — ABNORMAL LOW (ref 36.0–46.0)
Hemoglobin: 12 g/dL (ref 12.0–15.0)
Immature Granulocytes: 0 %
Lymphocytes Relative: 42 %
Lymphs Abs: 0.5 10*3/uL — ABNORMAL LOW (ref 0.7–4.0)
MCH: 29.5 pg (ref 26.0–34.0)
MCHC: 33.7 g/dL (ref 30.0–36.0)
MCV: 87.5 fL (ref 80.0–100.0)
Monocytes Absolute: 0.1 10*3/uL (ref 0.1–1.0)
Monocytes Relative: 9 %
Neutro Abs: 0.5 10*3/uL — ABNORMAL LOW (ref 1.7–7.7)
Neutrophils Relative %: 48 %
Platelets: 138 10*3/uL — ABNORMAL LOW (ref 150–400)
RBC: 4.07 MIL/uL (ref 3.87–5.11)
RDW: 15.5 % (ref 11.5–15.5)
WBC: 1.1 10*3/uL — CL (ref 4.0–10.5)
nRBC: 0 % (ref 0.0–0.2)

## 2021-09-26 LAB — COMPREHENSIVE METABOLIC PANEL
ALT: 19 U/L (ref 0–44)
AST: 11 U/L — ABNORMAL LOW (ref 15–41)
Albumin: 3.5 g/dL (ref 3.5–5.0)
Alkaline Phosphatase: 109 U/L (ref 38–126)
Anion gap: 5 (ref 5–15)
BUN: 17 mg/dL (ref 8–23)
CO2: 28 mmol/L (ref 22–32)
Calcium: 8.8 mg/dL — ABNORMAL LOW (ref 8.9–10.3)
Chloride: 107 mmol/L (ref 98–111)
Creatinine, Ser: 0.97 mg/dL (ref 0.44–1.00)
GFR, Estimated: >60 mL/min (ref >60)
Glucose, Bld: 111 mg/dL — ABNORMAL HIGH (ref 70–99)
Potassium: 4.2 mmol/L (ref 3.5–5.1)
Sodium: 140 mmol/L (ref 135–145)
Total Bilirubin: 1 mg/dL (ref 0.3–1.2)
Total Protein: 6.4 g/dL — ABNORMAL LOW (ref 6.5–8.1)

## 2021-09-26 LAB — POTASSIUM: Potassium: 4.1 mmol/L (ref 3.5–5.1)

## 2021-09-26 MED ORDER — AZACITIDINE CHEMO SQ INJECTION
50.0000 mg/m2 | Freq: Once | INTRAMUSCULAR | Status: AC
  Administered 2021-09-26: 87.5 mg via SUBCUTANEOUS
  Filled 2021-09-26: qty 3.5

## 2021-09-26 MED ORDER — ONDANSETRON HCL 4 MG PO TABS
8.0000 mg | ORAL_TABLET | Freq: Once | ORAL | Status: AC
  Administered 2021-09-26: 8 mg via ORAL
  Filled 2021-09-26: qty 2

## 2021-09-26 NOTE — Patient Instructions (Signed)
[facility]  Discharge Instructions: °Thank you for choosing [facility] to provide your oncology and hematology care.  ° °If you have a lab appointment with the Cancer Center, please go directly to the Cancer Center and check in at the registration area. °  °Wear comfortable clothing and clothing appropriate for easy access to any Portacath or PICC line.  ° °We strive to give you quality time with your provider. You may need to reschedule your appointment if you arrive late (15 or more minutes).  Arriving late affects you and other patients whose appointments are after yours.  Also, if you miss three or more appointments without notifying the office, you may be dismissed from the clinic at the provider’s discretion.    °  °For prescription refill requests, have your pharmacy contact our office and allow 72 hours for refills to be completed.   ° °Today you received the following chemotherapy and/or immunotherapy agents     °  °To help prevent nausea and vomiting after your treatment, we encourage you to take your nausea medication as directed. ° °BELOW ARE SYMPTOMS THAT SHOULD BE REPORTED IMMEDIATELY: °*FEVER GREATER THAN 100.4 F (38 °C) OR HIGHER °*CHILLS OR SWEATING °*NAUSEA AND VOMITING THAT IS NOT CONTROLLED WITH YOUR NAUSEA MEDICATION °*UNUSUAL SHORTNESS OF BREATH °*UNUSUAL BRUISING OR BLEEDING °*URINARY PROBLEMS (pain or burning when urinating, or frequent urination) °*BOWEL PROBLEMS (unusual diarrhea, constipation, pain near the anus) °TENDERNESS IN MOUTH AND THROAT WITH OR WITHOUT PRESENCE OF ULCERS (sore throat, sores in mouth, or a toothache) °UNUSUAL RASH, SWELLING OR PAIN  °UNUSUAL VAGINAL DISCHARGE OR ITCHING  ° °Items with * indicate a potential emergency and should be followed up as soon as possible or go to the Emergency Department if any problems should occur. ° °Please show the CHEMOTHERAPY ALERT CARD or IMMUNOTHERAPY ALERT CARD at check-in to the  Emergency Department and triage nurse. ° °Should you have questions after your visit or need to cancel or reschedule your appointment, please contact [facility]  Dept: [phone]  and follow the prompts.  Office hours are 8:00 a.m. to 4:30 p.m. Monday - Friday. Please note that voicemails left after 4:00 p.m. may not be returned until the following business day.  We are closed weekends and major holidays. You have access to a nurse at all times for urgent questions. Please call the main number to the clinic Dept: [phone] and follow the prompts. ° ° °For any non-urgent questions, you may also contact your provider using MyChart. We now offer e-Visits for anyone 18 and older to request care online for non-urgent symptoms. For details visit [email or web address]. °  °Also download the MyChart app! Go to the app store, search "MyChart", open the app, select [facility], and log in with your MyChart username and password. ° °Due to Covid, a mask is required upon entering the hospital/clinic. If you do not have a mask, one will be given to you upon arrival. For doctor visits, patients may have 1 support person aged 18 or older with them. For treatment visits, patients cannot have anyone with them due to current Covid guidelines and our immunocompromised population.  ° °

## 2021-09-28 ENCOUNTER — Encounter: Payer: Self-pay | Admitting: Internal Medicine

## 2021-09-28 NOTE — Assessment & Plan Note (Signed)
Follow tsh.  

## 2021-09-28 NOTE — Assessment & Plan Note (Signed)
Follow liver function tests.   

## 2021-09-28 NOTE — Assessment & Plan Note (Signed)
Followed and treated by hematology.

## 2021-09-28 NOTE — Assessment & Plan Note (Signed)
Continue lexapro.  Continue f/u with Dr Levine  

## 2021-09-28 NOTE — Assessment & Plan Note (Signed)
Seeing Dr Levine.  Currently on lexapro and clonazepam.  Denies SI.  Follow.  Continue f/u with psychiatry.  

## 2021-09-28 NOTE — Assessment & Plan Note (Signed)
Stable

## 2021-09-28 NOTE — Assessment & Plan Note (Signed)
Being followed by hematology.   

## 2021-09-28 NOTE — Assessment & Plan Note (Signed)
On thyroid replacement.  Follow tsh.  

## 2021-09-28 NOTE — Assessment & Plan Note (Signed)
Bone density reveals osteopenia. Discussed bone density results.  Discussed treatment.  Continue calcium, weight bearing exercise and vitamin D.

## 2021-09-28 NOTE — Assessment & Plan Note (Signed)
Follow lipid panel.   

## 2021-09-28 NOTE — Assessment & Plan Note (Signed)
No upper symptoms reported.  On protonix.   

## 2021-09-28 NOTE — Assessment & Plan Note (Signed)
Increased stress with her current medical issues.  Seeing Dr Clovis Riley.  Follow.  Notify me if feels needs any further intervention.

## 2021-10-03 ENCOUNTER — Telehealth: Payer: Self-pay | Admitting: *Deleted

## 2021-10-03 ENCOUNTER — Inpatient Hospital Stay: Payer: PPO | Attending: Oncology | Admitting: Hospice and Palliative Medicine

## 2021-10-03 DIAGNOSIS — D701 Agranulocytosis secondary to cancer chemotherapy: Secondary | ICD-10-CM | POA: Insufficient documentation

## 2021-10-03 DIAGNOSIS — R5383 Other fatigue: Secondary | ICD-10-CM | POA: Insufficient documentation

## 2021-10-03 DIAGNOSIS — R531 Weakness: Secondary | ICD-10-CM | POA: Insufficient documentation

## 2021-10-03 DIAGNOSIS — Z853 Personal history of malignant neoplasm of breast: Secondary | ICD-10-CM | POA: Insufficient documentation

## 2021-10-03 DIAGNOSIS — Z5111 Encounter for antineoplastic chemotherapy: Secondary | ICD-10-CM | POA: Insufficient documentation

## 2021-10-03 DIAGNOSIS — R197 Diarrhea, unspecified: Secondary | ICD-10-CM | POA: Insufficient documentation

## 2021-10-03 DIAGNOSIS — C9200 Acute myeloblastic leukemia, not having achieved remission: Secondary | ICD-10-CM | POA: Insufficient documentation

## 2021-10-03 DIAGNOSIS — E876 Hypokalemia: Secondary | ICD-10-CM | POA: Insufficient documentation

## 2021-10-03 DIAGNOSIS — Z85038 Personal history of other malignant neoplasm of large intestine: Secondary | ICD-10-CM | POA: Insufficient documentation

## 2021-10-03 DIAGNOSIS — K1230 Oral mucositis (ulcerative), unspecified: Secondary | ICD-10-CM

## 2021-10-03 MED ORDER — CLOTRIMAZOLE 1 % EX CREA
1.0000 | TOPICAL_CREAM | Freq: Two times a day (BID) | CUTANEOUS | 0 refills | Status: DC
Start: 2021-10-03 — End: 2022-04-02

## 2021-10-03 MED ORDER — MAGIC MOUTHWASH W/LIDOCAINE: 5.0000 mL | Freq: Three times a day (TID) | ORAL | 0 refills | Status: DC | PRN

## 2021-10-03 NOTE — Telephone Encounter (Signed)
Called and scheduled pt for virtual appointment this afternoon.

## 2021-10-03 NOTE — Telephone Encounter (Signed)
Patient called reporting that she has sores on the inside and outside of her mouth from [place]. Please advise

## 2021-10-03 NOTE — Progress Notes (Signed)
Virtual Visit via Telephone Note  I connected with Debra Hale on 10/03/21 at  2:45 PM EDT by telephone and verified that I am speaking with the correct person using two identifiers.  Location: Patient: Home Provider: Clinic   I discussed the limitations, risks, security and privacy concerns of performing an evaluation and management service by telephone and the availability of in person appointments. I also discussed with the patient that there may be a patient responsible charge related to this service. The patient expressed understanding and agreed to proceed.   History of Present Illness: Debra Hale is a 71 y.o. 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     Observations/Objective: Unable to connect with patient virtually.  I called and spoke with her by phone.  Patient reports several days of worsening white painful bumps in her mouth.  She also endorses red/cracked painful corners of her lips.  She denies other symptomatic complaints or concerns.  Assessment and Plan: Unfortunately, I was not able to visually assess as visit was completed via telephone.  However, patient's description sounds like mucositis and angular cheilitis.  We will start on Magic mouthwash and clotrimazole cream.  Patient instructed that if no improvement, will need to see her in clinic for further evaluation.  Follow Up Instructions: As needed   I discussed the assessment and treatment plan with the patient. The patient was provided an opportunity to ask questions and all were answered. The patient agreed with the plan and demonstrated an understanding of the instructions.   The patient was advised to call back or seek an in-person evaluation if the symptoms worsen or if the condition fails to improve as anticipated.  I provided 5 minutes of non-face-to-face time during this encounter.   Irean Hong, NP

## 2021-10-10 ENCOUNTER — Other Ambulatory Visit (HOSPITAL_COMMUNITY): Payer: Self-pay

## 2021-10-13 ENCOUNTER — Telehealth: Payer: Self-pay | Admitting: Oncology

## 2021-10-13 NOTE — Telephone Encounter (Signed)
Pt called and said she would not be able to make it to her appt on 6/22. The check out note said "6/19: lab, vidaza  6/20: MD, Clearnce Sorrel, vidaza  6/21- 6/23, vidaza"   How would you like me to reschedule the 6/22 appt?

## 2021-10-14 ENCOUNTER — Encounter: Payer: Self-pay | Admitting: Cardiovascular Disease

## 2021-10-14 ENCOUNTER — Ambulatory Visit: Payer: PPO | Admitting: Cardiovascular Disease

## 2021-10-14 VITALS — BP 110/70 | HR 88 | Ht 64.5 in | Wt 130.4 lb

## 2021-10-14 DIAGNOSIS — R002 Palpitations: Secondary | ICD-10-CM

## 2021-10-14 DIAGNOSIS — Z8679 Personal history of other diseases of the circulatory system: Secondary | ICD-10-CM | POA: Diagnosis not present

## 2021-10-14 NOTE — Progress Notes (Signed)
Cardiology Office Note   Date:  10/14/2021   ID:  [name], DOB 1951-04-22, MRN [id number]  PCP:  Debra Pheasant, MD  Cardiologist:   Debra Sacramento, MD   Chief Complaint  Patient presents with   Other    6 month f/u palpitations. Meds reviewed verbally with pt.       History of Present Illness: Debra Hale is a 71 y.o. female who presents for a follow up visit recurrent pericarditis.  She has known history of hyperlipidemia, thyroid carcinoma s/p surgery and XRT, colon cancer s/p laparoscopic right colectomy, AML, nephrolithiasis, anxiety, osteoporosis and breast cancer. She had palpitations in the setting of hypokalemia and hypomagnesemia.  She had previous treadmill stress test in 2017 that showed no evidence of ischemia with poor exercise tolerance and hypertensive response to exercise.  She had recurrent pericarditis and previous small pericardial effusion since 2017.  She was hospitalized in February of 2022 due to perirectal abscess.  She was seen in April of 2022 for increased palpitations.  Echocardiogram in May showed normal LV systolic function with no significant pulmonary hypertension.  She continues to receive chemotherapy for acute myeloid leukemia.  She has been doing reasonably well from a cardiac standpoint with minimal palpitations.  No chest pain or shortness of breath.   Past Medical History:  Diagnosis Date   Anxiety    Arthritis    Osteoarthritis   Breast cancer ([place]) 2009   right breast lumpectomy with rad tx   Colon cancer ([place])    surgery with chemo and rad tx   Complication of anesthesia    GERD (gastroesophageal reflux disease)    Heart palpitations    History of hiatal hernia    History of kidney stones    Hypothyroidism    Liver disease    Liver nodule    s/p negative biopsy   Malignant neoplasm of thyroid gland (Debra Hale) 2002   s/p surgery and XRT   Osteoporosis    Other and unspecified hyperlipidemia    Palpitations     Personal history of chemotherapy    Personal history of malignant neoplasm of large intestine    carcinoma - cecum, s/p right laparoscopic colectomy - s/p chemotherapy and XRT   Personal history of radiation therapy    Pneumonia 2019   PONV (postoperative nausea and vomiting)    Pure hypercholesterolemia    Unspecified hereditary and idiopathic peripheral neuropathy     Past Surgical History:  Procedure Laterality Date   APPENDECTOMY  1985   BREAST BIOPSY Right 2009   positive   BREAST BIOPSY Right 2009   negative   BREAST LUMPECTOMY Right 2009   breast cancer   CHOLECYSTECTOMY  1995   COLONOSCOPY     COLONOSCOPY WITH PROPOFOL N/A 10/29/2017   Procedure: COLONOSCOPY WITH PROPOFOL;  Surgeon: Debra Silvas, MD;  Location: [facility] ENDOSCOPY;  Service: Endoscopy;  Laterality: N/A;   DILATION AND CURETTAGE OF UTERUS  1990   DILATION AND CURETTAGE, DIAGNOSTIC / THERAPEUTIC  1990   ESOPHAGOGASTRODUODENOSCOPY     ESOPHAGOGASTRODUODENOSCOPY (EGD) WITH PROPOFOL N/A 10/29/2017   Procedure: ESOPHAGOGASTRODUODENOSCOPY (EGD) WITH PROPOFOL;  Surgeon: Debra Silvas, MD;  Location: [facility] ENDOSCOPY;  Service: Endoscopy;  Laterality: N/A;   INCISION AND DRAINAGE PERIRECTAL ABSCESS N/A 06/17/2020   Procedure: IRRIGATION AND DEBRIDEMENT PERIRECTAL ABSCESS;  Surgeon: Jules Husbands, MD;  Location: [facility] ORS;  Service: General;  Laterality: N/A;   IR BONE MARROW BIOPSY & ASPIRATION  07/09/2021  LAPAROSCOPIC PARTIAL COLECTOMY     stage 3-C carcinoma of the cecum, s/p chemotherapy and xrt   LITHOTRIPSY     SIGMOIDOSCOPY  08/26/1993   THYROID LOBECTOMY  2002   s/p XRT   TOTAL HIP ARTHROPLASTY Right 10/24/2019   Procedure: TOTAL HIP ARTHROPLASTY;  Surgeon: Debra Mull, MD;  Location: [facility] ORS;  Service: Orthopedics;  Laterality: Right;     Current Outpatient Medications  Medication Sig Dispense Refill   Azelastine-Fluticasone 137-50 MCG/ACT SUSP SPRAY 2 SPRAYS INTO EACH NOSTRIL EVERY DAY 23 g 3    clonazePAM (KLONOPIN) 0.5 MG tablet Take 1.5 mg by mouth 2 (two) times daily.   0   clotrimazole (LOTRIMIN) 1 % cream Apply 1 application. topically 2 (two) times daily. 30 g 0   diphenoxylate-atropine (LOMOTIL) 2.5-0.025 MG tablet Take 1 tablet by mouth 4 (four) times daily as needed for diarrhea or loose stools. 60 tablet 1   escitalopram (LEXAPRO) 20 MG tablet Take 20 mg by mouth every morning.      levofloxacin (LEVAQUIN) 500 MG tablet Take 1 tablet (500 mg total) by mouth daily. 30 tablet 1   levothyroxine (SYNTHROID) 125 MCG tablet TAKE 1 TABLET EVERY DAY ON EMPTY STOMACHWITH A GLASS OF WATER AT LEAST 30-60 MINBEFORE BREAKFAST 90 tablet 1   loperamide (IMODIUM) 2 MG capsule Take by mouth.     metoprolol tartrate (LOPRESSOR) 25 MG tablet TAKE 1 TABLET (25 MG TOTAL) BY MOUTH AS NEEDED (AS NEEDED UP TO TWICE DAILY FOR PALPITATIONS). 60 tablet 1   ondansetron (ZOFRAN) 8 MG tablet Take 1 tablet (8 mg total) by mouth 2 (two) times daily as needed for nausea or vomiting. 20 tablet 0   pantoprazole (PROTONIX) 40 MG tablet TAKE ONE TABLET (40 MG) BY MOUTH EVERY DAY 90 tablet 2   potassium chloride SA (KLOR-CON M) 20 MEQ tablet TAKE ONE (1) TABLET BY MOUTH TWO TIMES PER DAY 60 tablet 0   valACYclovir (VALTREX) 500 MG tablet Take 1 tablet (500 mg total) by mouth daily. 90 tablet 3   venetoclax (VENCLEXTA) 100 MG tablet Take 4 tablets (400 mg total) by mouth daily. Take for 14 days, then hold for 14 days. Repeat every 28 days. Take with a meal and a full glass of water. 56 tablet 1   Current Facility-Administered Medications  Medication Dose Route Frequency Provider Last Rate Last Admin   ondansetron (ZOFRAN) tablet 8 mg  8 mg Oral Once Guse, Jacquelynn Cree, FNP       Facility-Administered Medications Ordered in Other Visits  Medication Dose Route Frequency Provider Last Rate Last Admin   potassium chloride (KLOR-CON) CR tablet 40 mEq  40 mEq Oral BID Lloyd Huger, MD   40 mEq at 07/08/20 1136     Allergies:   Demeclocycline, Sulfa antibiotics, Tetracyclines & related, Augmentin [amoxicillin-pot clavulanate], Bentyl [dicyclomine hcl], Ciprofloxacin, Codeine, Dicyclomine hcl, Epinephrine, Flagyl [metronidazole], Librax [chlordiazepoxide-clidinium], Novocain [procaine], Phenobarbital, Prednisone, and Ultram [tramadol]    Social History:  The patient  reports that she has never smoked. She has never used smokeless tobacco. She reports that she does not drink alcohol and does not use drugs.   Family History:  The patient's family history includes Alzheimer's disease in her mother; Breast cancer in her maternal aunt and sister; Cancer in her father; Colon cancer in her father; Lung cancer in her father and sister; Prostate cancer in her father; Stroke in her mother.    ROS:  Please see the history of present  illness.   Otherwise, review of systems are positive for none.   All other systems are reviewed and negative.    PHYSICAL EXAM: VS:  BP 110/70 (BP Location: Left Arm, Patient Position: Sitting, Cuff Size: Normal)   Pulse 88   Ht 5' 4.5" (1.638 m)   Wt 130 lb 6 oz (59.1 kg)   SpO2 98%   BMI 22.03 kg/m  , BMI Body mass index is 22.03 kg/m. GEN: Well nourished, well developed, in no acute distress  HEENT: normal  Neck: no JVD, carotid bruits, or masses Cardiac: RRR; no murmurs, rubs, or gallops,no edema  Respiratory:  clear to auscultation bilaterally, normal work of breathing GI: soft, nontender, nondistended, + BS MS: no deformity or atrophy  Skin: warm and dry, no rash Neuro:  Strength and sensation are intact Psych: euthymic mood, full affect   EKG:  EKG is  ordered today. EKG showed sinus rhythm with PACs, low voltage.  Inferior and anterior T wave changes.   Recent Labs: 07/28/2021: Magnesium 1.7 09/26/2021: ALT 19; BUN 17; Creatinine, Ser 0.97; Hemoglobin 12.0; Platelets 138; Potassium 4.1; Potassium 4.2; Sodium 140    Lipid Panel    Component Value Date/Time    CHOL 163 02/20/2020 1045   TRIG 110.0 02/20/2020 1045   HDL 40.70 02/20/2020 1045   CHOLHDL 4 02/20/2020 1045   VLDL 22.0 02/20/2020 1045   LDLCALC 100 (H) 02/20/2020 1045   LDLDIRECT 126.0 07/11/2015 0956      Wt Readings from Last 3 Encounters:  10/14/21 130 lb 6 oz (59.1 kg)  09/23/21 131 lb (59.4 kg)  09/23/21 132 lb 3.2 oz (60 kg)       ASSESSMENT AND PLAN:  1.  History of recurrent pericarditis: No recent symptoms.  She seems to be stable overall.  Most recent echocardiogram in May 2022 showed normal LV systolic function with no pericardial effusion.  2. Palpitations: No significant arrhythmia noted on her EKG. symptoms are stable overall.  She has metoprolol to be used as needed.   Disposition:   FU in 6 months  Signed,  Debra Sacramento, MD  10/14/2021 3:47 PM    [place]

## 2021-10-14 NOTE — Patient Instructions (Signed)

## 2021-10-14 NOTE — Telephone Encounter (Signed)
I tried calling patient to let her know. No answer and could not leave VM.

## 2021-10-15 ENCOUNTER — Other Ambulatory Visit: Payer: Self-pay | Admitting: Oncology

## 2021-10-15 ENCOUNTER — Other Ambulatory Visit (HOSPITAL_COMMUNITY): Payer: Self-pay

## 2021-10-15 DIAGNOSIS — C9200 Acute myeloblastic leukemia, not having achieved remission: Secondary | ICD-10-CM

## 2021-10-15 MED ORDER — VENETOCLAX 100 MG PO TABS
400.0000 mg | ORAL_TABLET | Freq: Every day | ORAL | 1 refills | Status: DC
  Filled 2021-10-15: qty 56, 28d supply, fill #0
  Filled 2021-11-12: qty 56, 28d supply, fill #1

## 2021-10-15 NOTE — Telephone Encounter (Signed)
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    0 Result Notes           Component Ref Range & Units 2 wk ago (09/26/21) 3 wk ago (09/22/21) 1 mo ago (09/15/21) 1 mo ago (09/08/21) 1 mo ago (09/03/21) 1 mo ago (09/01/21) 1 mo ago (08/25/21)  WBC 4.0 - 10.5 K/uL 1.1 Low Panic   1.5 Low   1.2 Low Panic  CM  1.3 Low Panic  CM  1.4 Low Panic  CM  1.1 Low Panic  CM  1.6 Low    Comment: REPEATED TO VERIFY  THIS CRITICAL RESULT HAS VERIFIED AND BEEN CALLED TO SHERRY VENABLE BY KIM ROOS ON 05 26 2023 AT 49, AND HAS BEEN READ BACK.   RBC 3.87 - 5.11 MIL/uL 4.07  4.41  4.32  4.48  4.11  4.37  4.46   Hemoglobin 12.0 - 15.0 g/dL 12.0  12.9  12.9  13.1  11.9 Low   12.6  12.8   HCT 36.0 - 46.0 % 35.6 Low   37.4  36.8  37.9  35.0 Low   37.6  37.6   MCV 80.0 - 100.0 fL 87.5  84.8  85.2  84.6  85.2  86.0  84.3   MCH 26.0 - 34.0 pg 29.5  29.3  29.9  29.2  29.0  28.8  28.7   MCHC 30.0 - 36.0 g/dL 33.7  34.5  35.1  34.6  34.0  33.5  34.0   RDW 11.5 - 15.5 % 15.5  15.9 High   16.4 High   16.1 High   15.7 High   16.0 High   16.4 High    Platelets 150 - 400 K/uL 138 Low   146 Low   53 Low  CM  93 Low   151  174  153   nRBC 0.0 - 0.2 % 0.0  0.0  0.0  0.0  0.0  0.0  0.0   Neutrophils Relative % % 48  51  52  49  46  47  47   Neutro Abs 1.7 - 7.7 K/uL 0.5 Low   0.8 Low   0.6 Low   0.6 Low   0.6 Low   0.5 Low   0.8 Low    Lymphocytes Relative % 42  39  41  36  41  42  44   Lymphs Abs 0.7 - 4.0 K/uL 0.5 Low   0.6 Low   0.5 Low   0.5 Low   0.6 Low   0.5 Low   0.7   Monocytes Relative % '9  9  6  15  12  8  6   '$ Monocytes Absolute 0.1 - 1.0 K/uL 0.1  0.1  0.1  0.2  0.2  0.1  0.1   Eosinophils Relative % 1  0  0  0  '1  3  1   '$ Eosinophils Absolute 0.0 - 0.5 K/uL 0.0  0.0  0.0  0.0  0.0  0.0  0.0   Basophils Relative % 0  1  0  0  0  0  1   Basophils  Absolute 0.0 - 0.1 K/uL 0.0  0.0  0.0  0.0  0.0  0.0  0.0   Immature Granulocytes % 0  0  1  0  0  0  1   Abs Immature Granulocytes 0.00 - 0.07 K/uL 0.00  0.00 CM  0.01 CM  0.00 CM  0.00 CM  0.00 CM  0.01 CM   Comment: Performed at [facility], [address] [place], [place] [address]  WBC Morphology     TOO FEW TOO COUNT      RBC Morphology     MORPHOLOGY UNREMARKABLE      Smear Review     Normal platelet morphology CM      Resulting Agency  [facility] [place] [facility] [place] [facility] [place] [facility] [place] [facility] [place] [facility]         Specimen Collected: 09/26/21 12:50 Last Resulted: 09/26/21 13:10      Lab Flowsheet    Order Details    View Encounter    Lab and Collection Details    Routing    Result History    View All Conversations on this Encounter      CM=Additional comments      Result Care Coordination   Patient Communication   Add Comments   Add Notifications  Back to Top       Other Results from 09/26/2021  Potassium Order: [id number] Status: Final result    Visible to patient: Yes (not seen)    Next appt: 10/20/2021 at 01:15 PM in Oncology ([facility])    Dx: Hypokalemia    0 Result Notes           Component Ref Range & Units 2 wk ago (09/26/21) 3 wk ago (09/22/21) 1 mo ago (09/15/21) 1 mo ago (09/08/21) 1 mo ago (09/03/21) 1 mo ago (09/01/21) 1 mo ago (08/25/21)  Potassium 3.5 - 5.1 mmol/L 4.1  3.2 Low   3.4 Low   3.5  3.5  4.4  3.1 Low    Comment: Performed at [facility], [address] [place], [place] [address]  Resulting Agency  [place] [facility] [place] [facility] [place] [facility] [place] [facility] [place] [facility] [facility]         Specimen Collected: 09/26/21 12:50 Last Resulted: 09/26/21 13:08      Lab Flowsheet    Order Details    View Encounter    Lab and Collection Details    Routing    Result History    View All Conversations on this Encounter        Result Care Coordination   Patient Communication   Add Comments   Add  Notifications  Back to Top          Contains abnormal data Comprehensive metabolic panel Order: [id number] Status: Final result    Visible to patient: Yes (not seen)    Next appt: 10/20/2021 at 01:15 PM in Oncology ([facility])    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    0 Result Notes           Component Ref Range & Units 2 wk ago (09/26/21) 2 wk ago (09/26/21) 3 wk ago (09/22/21) 1 mo ago (09/15/21) 1 mo ago (09/08/21) 1 mo ago (09/03/21) 1 mo ago (09/01/21)  Sodium 135 - 145 mmol/L 140   141  138  140  138  141   Potassium 3.5 - 5.1 mmol/L 4.2  4.1 CM  3.2 Low   3.4 Low   3.5  3.5  4.4  Chloride 98 - 111 mmol/L 107   107  107  108  107  107   CO2 22 - 32 mmol/L '28   27  24  24  27  27   '$ Glucose, Bld 70 - 99 mg/dL 111 High    116 High  CM  85 CM  108 High  CM  103 High  CM  89 CM   Comment: Glucose reference range applies only to samples taken after fasting for at least 8 hours.  BUN 8 - 23 mg/dL '17   19  19  16  16  16   '$ Creatinine, Ser 0.44 - 1.00 mg/dL 0.97   0.92  1.05 High   1.17 High   1.00  1.02 High    Calcium 8.9 - 10.3 mg/dL 8.8 Low    8.2 Low   7.8 Low   8.5 Low   8.4 Low   9.2   Total Protein 6.5 - 8.1 g/dL 6.4 Low    6.7  6.5  6.8  6.4 Low   6.9   Albumin 3.5 - 5.0 g/dL 3.5   3.7  3.8  3.8  3.6  3.6   AST 15 - 41 U/L 11 Low    '15  15  13 '$ Low   10 Low   15   ALT 0 - 44 U/L '19   22  22  18  21  27   '$ Alkaline Phosphatase 38 - 126 U/L 109   111  115  115  107  108   Total Bilirubin 0.3 - 1.2 mg/dL 1.0   1.0  0.9  1.0  0.8  0.9   GFR, Estimated >60 mL/min >60   >60 CM  57 Low  CM  50 Low  CM  >60 CM  59 Low  CM   Comment: (NOTE)  Calculated using the CKD-EPI Creatinine Equation (2021)   Anion gap 5 - '15 5   7 '$ CM  7 CM  8 CM  4 Low  CM  7 CM   Comment: Performed at [facility], [address] [place], [place] [address]  Resulting Agency  [facility] [place] [facility] [place] [facility] [place] [facility] [place] [facility] [place] [facility] [place] [facility]         Specimen Collected:  09/26/21 12:50 Last Resulted: 09/26/21 13:13

## 2021-10-17 NOTE — Progress Notes (Unsigned)
[facility]  [phone] [phone]) [phone]  ID: EMRI SAMPLE OB: May 03, 1951  MR#: [id number]  DXA#:[id number]  Patient Care Team: [name], MD as PCP - General (Internal Medicine) Wellington Hampshire, MD as PCP - Cardiology (Cardiology)  CHIEF COMPLAINT: AML.  INTERVAL HISTORY: Patient returns to clinic today for further evaluation and continuation of cycle 15 of Vidaza and venetoclax.  She continues to have chronic weakness and fatigue as well as intermittent diarrhea, but otherwise feels well.  She continues to be anxious. She denies any pain.  She has no neurologic complaints.  She denies any recent fevers or illnesses. She has no chest pain, shortness of breath, cough, or hemoptysis.  She denies any nausea, vomiting, or constipation.  She has no urinary complaints.  Patient offers no further specific complaints today.  REVIEW OF SYSTEMS:   Review of Systems  Constitutional:  Positive for malaise/fatigue. Negative for fever and weight loss.  Respiratory: Negative.  Negative for cough, hemoptysis and shortness of breath.   Cardiovascular: Negative.  Negative for chest pain and leg swelling.  Gastrointestinal:  Positive for diarrhea. Negative for abdominal pain.  Genitourinary: Negative.  Negative for dysuria.  Musculoskeletal: Negative.  Negative for back pain and falls.  Skin: Negative.  Negative for rash.  Neurological:  Positive for weakness. Negative for dizziness, focal weakness and headaches.  Psychiatric/Behavioral:  Negative for memory loss. The patient is nervous/anxious.     As per HPI. Otherwise, a complete review of systems is negative.  PAST MEDICAL HISTORY: Past Medical History:  Diagnosis Date   Anxiety    Arthritis    Osteoarthritis   Breast cancer ([place]) 2009   right breast lumpectomy with rad tx   Colon cancer ([place])    surgery with chemo and rad tx   Complication of anesthesia    GERD (gastroesophageal reflux disease)     Heart palpitations    History of hiatal hernia    History of kidney stones    Hypothyroidism    Liver disease    Liver nodule    s/p negative biopsy   Malignant neoplasm of thyroid gland ([place]) 2002   s/p surgery and XRT   Osteoporosis    Other and unspecified hyperlipidemia    Palpitations    Personal history of chemotherapy    Personal history of malignant neoplasm of large intestine    carcinoma - cecum, s/p right laparoscopic colectomy - s/p chemotherapy and XRT   Personal history of radiation therapy    Pneumonia 2019   PONV (postoperative nausea and vomiting)    Pure hypercholesterolemia    Unspecified hereditary and idiopathic peripheral neuropathy     PAST SURGICAL HISTORY: Past Surgical History:  Procedure Laterality Date   APPENDECTOMY  1985   BREAST BIOPSY Right 2009   positive   BREAST BIOPSY Right 2009   negative   BREAST LUMPECTOMY Right 2009   breast cancer   CHOLECYSTECTOMY  1995   COLONOSCOPY     COLONOSCOPY WITH PROPOFOL N/A 10/29/2017   Procedure: COLONOSCOPY WITH PROPOFOL;  Surgeon: Manya Silvas, MD;  [facility] END[facility];  Service: Endoscopy;  Laterality: N/A;   DILATION AND CURETTAGE OF UTERUS  1990   DILATION AND CURETTAGE, DIAGNOSTIC / THERAPEUTIC  1990   ESOPHAGOGASTRODUODENOSCOPY     ESOPHAGOGASTRODUODENOSCOPY (EGD) WITH PROPOFOL N/A 10/29/2017   Procedure: ESOPHAGOGASTRODUODENOSCOPY (EGD) WITH PROPOFOL;  Surgeon: Manya Silvas, MD;  Location: [facility] ENDOSCOPY;  Service: Endoscopy;  Laterality: N/A;   INCISION  AND DRAINAGE PERIRECTAL ABSCESS N/A 06/17/2020   Procedure: IRRIGATION AND DEBRIDEMENT PERIRECTAL ABSCESS;  Surgeon: Jules Husbands, MD;  Location: [facility] ORS;  Service: General;  Laterality: N/A;   IR BONE MARROW BIOPSY & ASPIRATION  07/09/2021   LAPAROSCOPIC PARTIAL COLECTOMY     stage 3-C carcinoma of the cecum, s/p chemotherapy and xrt   LITHOTRIPSY     SIGMOIDOSCOPY  08/26/1993   THYROID LOBECTOMY  2002   s/p XRT   TOTAL HIP  ARTHROPLASTY Right 10/24/2019   Procedure: TOTAL HIP ARTHROPLASTY;  Surgeon: Corky Mull, MD;  Location: [facility] ORS;  Service: Orthopedics;  Laterality: Right;    FAMILY HISTORY: Family History  Problem Relation Age of Onset   Stroke Mother        64s   Alzheimer's disease Mother    Lung cancer Father    Prostate cancer Father    Cancer Father        Colon   Colon cancer Father    Breast cancer Sister        73's   Lung cancer Sister    Breast cancer Maternal Aunt     ADVANCED DIRECTIVES (Y/N):  N  HEALTH MAINTENANCE: Social History   Tobacco Use   Smoking status: Never   Smokeless tobacco: Never  Vaping Use   Vaping Use: Never used  Substance Use Topics   Alcohol use: No    Alcohol/week: 0.0 standard drinks of alcohol   Drug use: No     Colonoscopy:  PAP:  Bone density:  Lipid panel:  Allergies  Allergen Reactions   Demeclocycline Other (See Comments)    Throat swells   Sulfa Antibiotics Other (See Comments)    Other reaction(s): Other (See Comments) Throat swells Other reaction(s): Other (See Comments) Throat swells   Tetracyclines & Related Other (See Comments)    Throat swells    Augmentin [Amoxicillin-Pot Clavulanate] Diarrhea   Bentyl [Dicyclomine Hcl]     unkn   Ciprofloxacin Diarrhea   Codeine Other (See Comments)    dizziness     Dicyclomine Hcl Other (See Comments)    unkn   Epinephrine     Speeds heart up - panic    Flagyl [Metronidazole] Nausea And Vomiting   Librax [Chlordiazepoxide-Clidinium]     unkn   Novocain [Procaine] Other (See Comments)    "Shaky"   Phenobarbital     unkn   Prednisone     Nervous     Ultram [Tramadol] Other (See Comments)    Sick feeling    Current Outpatient Medications  Medication Sig Dispense Refill   Azelastine-Fluticasone 137-50 MCG/ACT SUSP SPRAY 2 SPRAYS INTO EACH NOSTRIL EVERY DAY 23 g 3   clonazePAM (KLONOPIN) 0.5 MG tablet Take 1.5 mg by mouth 2 (two) times daily.   0   clotrimazole  (LOTRIMIN) 1 % cream Apply 1 application. topically 2 (two) times daily. 30 g 0   diphenoxylate-atropine (LOMOTIL) 2.5-0.025 MG tablet Take 1 tablet by mouth 4 (four) times daily as needed for diarrhea or loose stools. 60 tablet 1   escitalopram (LEXAPRO) 20 MG tablet Take 20 mg by mouth every morning.      levothyroxine (SYNTHROID) 125 MCG tablet TAKE 1 TABLET EVERY DAY ON EMPTY STOMACHWITH A GLASS OF WATER AT LEAST 30-60 MINBEFORE BREAKFAST 90 tablet 1   loperamide (IMODIUM) 2 MG capsule Take by mouth.     metoprolol tartrate (LOPRESSOR) 25 MG tablet TAKE 1 TABLET (25 MG TOTAL) BY MOUTH AS  NEEDED (AS NEEDED UP TO TWICE DAILY FOR PALPITATIONS). 60 tablet 1   ondansetron (ZOFRAN) 8 MG tablet Take 1 tablet (8 mg total) by mouth 2 (two) times daily as needed for nausea or vomiting. 20 tablet 0   pantoprazole (PROTONIX) 40 MG tablet TAKE ONE TABLET (40 MG) BY MOUTH EVERY DAY 90 tablet 2   potassium chloride SA (KLOR-CON M) 20 MEQ tablet TAKE ONE (1) TABLET BY MOUTH TWO TIMES PER DAY 60 tablet 0   valACYclovir (VALTREX) 500 MG tablet Take 1 tablet (500 mg total) by mouth daily. 90 tablet 3   venetoclax (VENCLEXTA) 100 MG tablet Take 4 tablets (400 mg total) by mouth daily. Take for 14 days, then hold for 14 days. Repeat every 28 days. Take with a meal and a full glass of water. 56 tablet 1   Current Facility-Administered Medications  Medication Dose Route Frequency Provider Last Rate Last Admin   ondansetron (ZOFRAN) tablet 8 mg  8 mg Oral Once Guse, Jacquelynn Cree, FNP       Facility-Administered Medications Ordered in Other Visits  Medication Dose Route Frequency Provider Last Rate Last Admin   potassium chloride (KLOR-CON) CR tablet 40 mEq  40 mEq Oral BID Lloyd Huger, MD   40 mEq at 07/08/20 1136    OBJECTIVE: Vitals:   10/21/21 1300  BP: 117/69  Pulse: 87  Resp: 16  Temp: (!) 96.1 F (35.6 C)  SpO2: 99%     Body mass index is 22.29 kg/m.    ECOG FS:0 - Asymptomatic  General:  Well-developed, well-nourished, no acute distress. Eyes: Pink conjunctiva, anicteric sclera. HEENT: Normocephalic, moist mucous membranes. Lungs: No audible wheezing or coughing. Heart: Regular rate and rhythm. Abdomen: Soft, nontender, no obvious distention. Musculoskeletal: No edema, cyanosis, or clubbing. Neuro: Alert, answering all questions appropriately. Cranial nerves grossly intact. Skin: No rashes or petechiae noted. Psych: Normal affect.  LAB RESULTS:  Lab Results  Component Value Date   NA 143 10/20/2021   K 3.3 (L) 10/20/2021   CL 107 10/20/2021   CO2 26 10/20/2021   GLUCOSE 100 (H) 10/20/2021   BUN 17 10/20/2021   CREATININE 0.89 10/20/2021   CALCIUM 8.3 (L) 10/20/2021   PROT 6.5 10/20/2021   ALBUMIN 3.5 10/20/2021   AST 15 10/20/2021   ALT 28 10/20/2021   ALKPHOS 114 10/20/2021   BILITOT 0.9 10/20/2021   GFRNONAA >60 10/20/2021   GFRAA 54 (L) 10/26/2019    Lab Results  Component Value Date   WBC 1.1 (LL) 10/20/2021   NEUTROABS 0.5 (L) 10/20/2021   HGB 12.4 10/20/2021   HCT 35.7 (L) 10/20/2021   MCV 86.9 10/20/2021   PLT 154 10/20/2021     STUDIES: No results found.   ASSESSMENT: AML.  PLAN:    1.  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  Patient completed cycle 1 of induction therapy at [facility] with Vidaza and venetoclax.  She only received 5 days of Vidaza for cycle 2 recently which was interrupted secondary to significant diarrhea and a perirectal abscess.  Her most recent bone marrow biopsy on July 09, 2021 revealed an increased blast count to 5% suggesting slight progression of disease.  Case discussed with Dr. Marvel Plan at [facility]  Given her IDH-1 mutation, she may also benefit from an IDH inhibitor in the future.  Will continue maintenance  treatment with Vidaza on days 1 through 5 every 28 days  along with venetoclax 14 days on, 14 days off.  Proceed with treatment as planned this week.  Return to clinic in 4 weeks for further evaluation and continuation of Vidaza and venetoclax.  Appreciate clinical pharmacy input. 2.  Neutropenia: Secondary to treatment, monitor.  Proceed as above. 3.  Thrombocytopenia: Resolved. 4.  Perirectal abscess: Resolved.   5.  Diarrhea: Patient states her diarrhea is intermittent.  Continue Lomotil as needed.   6.  Supportive care: Patient previously was given referrals to home health and home palliative care.  Unclear if these services are still active.  Appreciate palliative care input.   7.  Hypokalemia: Mild, monitor.  Continue oral potassium supplementation.  8.  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  Patient also received chemotherapy and likely received Adriamycin, but exact regimen is unknown.  She completed 5 years of hormonal therapy in approximately June 2015. Currently, she has no evidence of disease.  Her most recent mammogram on July 29, 2021 was reported as BI-RADS 2.  Repeat in March 2024. 9.  History of colon cancer: Patient also states that she received chemotherapy for this, possibly FOLFOX but again this is unknown.   Patient expressed understanding and was in agreement with this plan. She also understands that She can call clinic at any time with any questions, concerns, or complaints.    Cancer Staging  History of breast cancer Staging form: Breast, AJCC 7th Edition - Clinical stage from 01/07/2016: Stage IA (T1c, N0, M0) - Signed by Lloyd Huger, MD on 01/07/2016 Laterality: Right Estrogen receptor status: Positive Progesterone receptor status: Positive HER2 status: Negative   Lloyd Huger, MD   10/21/2021 3:57 PM

## 2021-10-20 ENCOUNTER — Other Ambulatory Visit: Payer: Self-pay

## 2021-10-20 ENCOUNTER — Inpatient Hospital Stay: Payer: PPO

## 2021-10-20 VITALS — BP 134/70 | HR 80 | Temp 96.8°F | Resp 18

## 2021-10-20 DIAGNOSIS — D701 Agranulocytosis secondary to cancer chemotherapy: Secondary | ICD-10-CM | POA: Diagnosis not present

## 2021-10-20 DIAGNOSIS — R5383 Other fatigue: Secondary | ICD-10-CM | POA: Diagnosis not present

## 2021-10-20 DIAGNOSIS — Z853 Personal history of malignant neoplasm of breast: Secondary | ICD-10-CM | POA: Diagnosis not present

## 2021-10-20 DIAGNOSIS — Z85038 Personal history of other malignant neoplasm of large intestine: Secondary | ICD-10-CM | POA: Diagnosis not present

## 2021-10-20 DIAGNOSIS — C9200 Acute myeloblastic leukemia, not having achieved remission: Secondary | ICD-10-CM | POA: Diagnosis not present

## 2021-10-20 DIAGNOSIS — R197 Diarrhea, unspecified: Secondary | ICD-10-CM | POA: Diagnosis not present

## 2021-10-20 DIAGNOSIS — Z5111 Encounter for antineoplastic chemotherapy: Secondary | ICD-10-CM | POA: Diagnosis not present

## 2021-10-20 DIAGNOSIS — R531 Weakness: Secondary | ICD-10-CM | POA: Diagnosis not present

## 2021-10-20 DIAGNOSIS — E876 Hypokalemia: Secondary | ICD-10-CM | POA: Diagnosis not present

## 2021-10-20 LAB — COMPREHENSIVE METABOLIC PANEL
ALT: 28 U/L (ref 0–44)
AST: 15 U/L (ref 15–41)
Albumin: 3.5 g/dL (ref 3.5–5.0)
Alkaline Phosphatase: 114 U/L (ref 38–126)
Anion gap: 10 (ref 5–15)
BUN: 17 mg/dL (ref 8–23)
CO2: 26 mmol/L (ref 22–32)
Calcium: 8.3 mg/dL — ABNORMAL LOW (ref 8.9–10.3)
Chloride: 107 mmol/L (ref 98–111)
Creatinine, Ser: 0.89 mg/dL (ref 0.44–1.00)
GFR, Estimated: >60 mL/min (ref >60)
Glucose, Bld: 100 mg/dL — ABNORMAL HIGH (ref 70–99)
Potassium: 3.3 mmol/L — ABNORMAL LOW (ref 3.5–5.1)
Sodium: 143 mmol/L (ref 135–145)
Total Bilirubin: 0.9 mg/dL (ref 0.3–1.2)
Total Protein: 6.5 g/dL (ref 6.5–8.1)

## 2021-10-20 LAB — CBC WITH DIFFERENTIAL/PLATELET
Abs Immature Granulocytes: 0 10*3/uL (ref 0.00–0.07)
Basophils Absolute: 0 10*3/uL (ref 0.0–0.1)
Basophils Relative: 1 %
Eosinophils Absolute: 0 10*3/uL (ref 0.0–0.5)
Eosinophils Relative: 1 %
HCT: 35.7 % — ABNORMAL LOW (ref 36.0–46.0)
Hemoglobin: 12.4 g/dL (ref 12.0–15.0)
Immature Granulocytes: 0 %
Lymphocytes Relative: 40 %
Lymphs Abs: 0.4 10*3/uL — ABNORMAL LOW (ref 0.7–4.0)
MCH: 30.2 pg (ref 26.0–34.0)
MCHC: 34.7 g/dL (ref 30.0–36.0)
MCV: 86.9 fL (ref 80.0–100.0)
Monocytes Absolute: 0.1 10*3/uL (ref 0.1–1.0)
Monocytes Relative: 13 %
Neutro Abs: 0.5 10*3/uL — ABNORMAL LOW (ref 1.7–7.7)
Neutrophils Relative %: 45 %
Platelets: 154 10*3/uL (ref 150–400)
RBC: 4.11 MIL/uL (ref 3.87–5.11)
RDW: 15.8 % — ABNORMAL HIGH (ref 11.5–15.5)
WBC: 1.1 10*3/uL — CL (ref 4.0–10.5)
nRBC: 0 % (ref 0.0–0.2)

## 2021-10-20 MED ORDER — ONDANSETRON HCL 4 MG PO TABS
8.0000 mg | ORAL_TABLET | Freq: Once | ORAL | Status: AC
  Administered 2021-10-20: 8 mg via ORAL
  Filled 2021-10-20: qty 2

## 2021-10-20 MED ORDER — AZACITIDINE CHEMO SQ INJECTION
50.0000 mg/m2 | Freq: Once | INTRAMUSCULAR | Status: AC
  Administered 2021-10-20: 87.5 mg via SUBCUTANEOUS
  Filled 2021-10-20: qty 3.5

## 2021-10-20 NOTE — Patient Instructions (Signed)
[facility]  Discharge Instructions: Thank you for choosing [facility] to provide your oncology and hematology care.  If you have a lab appointment with the Cancer Center, please go directly to the Cancer Center and check in at the registration area.  Wear comfortable clothing and clothing appropriate for easy access to any Portacath or PICC line.   We strive to give you quality time with your provider. You may need to reschedule your appointment if you arrive late (15 or more minutes).  Arriving late affects you and other patients whose appointments are after yours.  Also, if you miss three or more appointments without notifying the office, you may be dismissed from the clinic at the provider's discretion.      For prescription refill requests, have your pharmacy contact our office and allow 72 hours for refills to be completed.    Today you received the following chemotherapy and/or immunotherapy agents VIDAZA      To help prevent nausea and vomiting after your treatment, we encourage you to take your nausea medication as directed.  BELOW ARE SYMPTOMS THAT SHOULD BE REPORTED IMMEDIATELY: *FEVER GREATER THAN 100.4 F (38 C) OR HIGHER *CHILLS OR SWEATING *NAUSEA AND VOMITING THAT IS NOT CONTROLLED WITH YOUR NAUSEA MEDICATION *UNUSUAL SHORTNESS OF BREATH *UNUSUAL BRUISING OR BLEEDING *URINARY PROBLEMS (pain or burning when urinating, or frequent urination) *BOWEL PROBLEMS (unusual diarrhea, constipation, pain near the anus) TENDERNESS IN MOUTH AND THROAT WITH OR WITHOUT PRESENCE OF ULCERS (sore throat, sores in mouth, or a toothache) UNUSUAL RASH, SWELLING OR PAIN  UNUSUAL VAGINAL DISCHARGE OR ITCHING   Items with * indicate a potential emergency and should be followed up as soon as possible or go to the Emergency Department if any problems should occur.  Please show the CHEMOTHERAPY ALERT CARD or IMMUNOTHERAPY ALERT CARD at check-in to the  Emergency Department and triage nurse.  Should you have questions after your visit or need to cancel or reschedule your appointment, please contact [facility]  [phone] and follow the prompts.  Office hours are 8:00 a.m. to 4:30 p.m. Monday - Friday. Please note that voicemails left after 4:00 p.m. may not be returned until the following business day.  We are closed weekends and major holidays. You have access to a nurse at all times for urgent questions. Please call the main number to the clinic [phone] and follow the prompts.  For any non-urgent questions, you may also contact your provider using MyChart. We now offer e-Visits for anyone 18 and older to request care online for non-urgent symptoms. For details visit [email or web address].   Also download the MyChart app! Go to the app store, search "MyChart", open the app, select [place], and log in with your MyChart username and password.  Masks are optional in the cancer centers. If you would like for your care team to wear a mask while they are taking care of you, please let them know. For doctor visits, patients may have with them one support person who is at least 72 years old. At this time, visitors are not allowed in the infusion area.  Azacitidine suspension for injection (subcutaneous use) What is this medication? AZACITIDINE (ay za SITE i deen) is a chemotherapy drug. This medicine reduces the growth of cancer cells and can suppress the immune system. It is used for treating myelodysplastic syndrome or some types of leukemia. This medicine may be used for other purposes; ask your   health care provider or pharmacist if you have questions. COMMON BRAND NAME(S): Vidaza What should I tell my care team before I take this medication? They need to know if you have any of these conditions: kidney disease liver disease liver tumors an unusual or allergic reaction to azacitidine, mannitol, other  medicines, foods, dyes, or preservatives pregnant or trying to get pregnant breast-feeding How should I use this medication? This medicine is for injection under the skin. It is administered in a hospital or clinic by a specially trained health care professional. Talk to your pediatrician regarding the use of this medicine in children. While this drug may be prescribed for selected conditions, precautions do apply. Overdosage: If you think you have taken too much of this medicine contact a poison control center or emergency room at once. NOTE: This medicine is only for you. Do not share this medicine with others. What if I miss a dose? It is important not to miss your dose. Call your doctor or health care professional if you are unable to keep an appointment. What may interact with this medication? Interactions have not been studied. Give your health care provider a list of all the medicines, herbs, non-prescription drugs, or dietary supplements you use. Also tell them if you smoke, drink alcohol, or use illegal drugs. Some items may interact with your medicine. This list may not describe all possible interactions. Give your health care provider a list of all the medicines, herbs, non-prescription drugs, or dietary supplements you use. Also tell them if you smoke, drink alcohol, or use illegal drugs. Some items may interact with your medicine. What should I watch for while using this medication? Visit your doctor for checks on your progress. This drug may make you feel generally unwell. This is not uncommon, as chemotherapy can affect healthy cells as well as cancer cells. Report any side effects. Continue your course of treatment even though you feel ill unless your doctor tells you to stop. In some cases, you may be given additional medicines to help with side effects. Follow all directions for their use. Call your doctor or health care professional for advice if you get a fever, chills or sore  throat, or other symptoms of a cold or flu. Do not treat yourself. This drug decreases your body's ability to fight infections. Try to avoid being around people who are sick. This medicine may increase your risk to bruise or bleed. Call your doctor or health care professional if you notice any unusual bleeding. You may need blood work done while you are taking this medicine. Do not become pregnant while taking this medicine and for 6 months after the last dose. Women should inform their doctor if they wish to become pregnant or think they might be pregnant. Men should not father a child while taking this medicine and for 3 months after the last dose. There is a potential for serious side effects to an unborn child. Talk to your health care professional or pharmacist for more information. Do not breast-feed an infant while taking this medicine and for 1 week after the last dose. This medicine may interfere with the ability to have a child. Talk with your doctor or health care professional if you are concerned about your fertility. What side effects may I notice from receiving this medication? Side effects that you should report to your doctor or health care professional as soon as possible: allergic reactions like skin rash, itching or hives, swelling of the face, lips, or   tongue low blood counts - this medicine may decrease the number of white blood cells, red blood cells and platelets. You may be at increased risk for infections and bleeding. signs of infection - fever or chills, cough, sore throat, pain passing urine signs of decreased platelets or bleeding - bruising, pinpoint red spots on the skin, black, tarry stools, blood in the urine signs of decreased red blood cells - unusually weak or tired, fainting spells, lightheadedness signs and symptoms of kidney injury like trouble passing urine or change in the amount of urine signs and symptoms of liver injury like dark yellow or brown urine; general  ill feeling or flu-like symptoms; light-colored stools; loss of appetite; nausea; right upper belly pain; unusually weak or tired; yellowing of the eyes or skin Side effects that usually do not require medical attention (report to your doctor or health care professional if they continue or are bothersome): constipation diarrhea nausea, vomiting pain or redness at the injection site unusually weak or tired This list may not describe all possible side effects. Call your doctor for medical advice about side effects. You may report side effects to FDA at 1-800-FDA-1088. Where should I keep my medication? This drug is given in a hospital or clinic and will not be stored at home. NOTE: This sheet is a summary. It may not cover all possible information. If you have questions about this medicine, talk to your doctor, pharmacist, or health care provider.  2023 Elsevier/Gold Standard (2016-05-20 00:00:00)    

## 2021-10-21 ENCOUNTER — Inpatient Hospital Stay (HOSPITAL_BASED_OUTPATIENT_CLINIC_OR_DEPARTMENT_OTHER): Payer: PPO | Admitting: Oncology

## 2021-10-21 ENCOUNTER — Inpatient Hospital Stay: Payer: PPO

## 2021-10-21 ENCOUNTER — Encounter: Payer: Self-pay | Admitting: Oncology

## 2021-10-21 ENCOUNTER — Inpatient Hospital Stay: Payer: PPO | Admitting: Pharmacist

## 2021-10-21 VITALS — BP 117/69 | HR 87 | Temp 96.1°F | Resp 16 | Ht 64.5 in | Wt 131.9 lb

## 2021-10-21 DIAGNOSIS — C9200 Acute myeloblastic leukemia, not having achieved remission: Secondary | ICD-10-CM | POA: Diagnosis not present

## 2021-10-21 DIAGNOSIS — Z5111 Encounter for antineoplastic chemotherapy: Secondary | ICD-10-CM | POA: Diagnosis not present

## 2021-10-21 MED ORDER — ONDANSETRON HCL 4 MG PO TABS
8.0000 mg | ORAL_TABLET | Freq: Once | ORAL | Status: AC
  Administered 2021-10-21: 8 mg via ORAL
  Filled 2021-10-21: qty 2

## 2021-10-21 MED ORDER — AZACITIDINE CHEMO SQ INJECTION
50.0000 mg/m2 | Freq: Once | INTRAMUSCULAR | Status: AC
  Administered 2021-10-21: 87.5 mg via SUBCUTANEOUS
  Filled 2021-10-21: qty 3.5

## 2021-10-21 NOTE — Patient Instructions (Signed)
[facility]  Discharge Instructions: Thank you for choosing [facility] to provide your oncology and hematology care.  If you have a lab appointment with the Cancer Center, please go directly to the Cancer Center and check in at the registration area.  Wear comfortable clothing and clothing appropriate for easy access to any Portacath or PICC line.   We strive to give you quality time with your provider. You may need to reschedule your appointment if you arrive late (15 or more minutes).  Arriving late affects you and other patients whose appointments are after yours.  Also, if you miss three or more appointments without notifying the office, you may be dismissed from the clinic at the provider's discretion.      For prescription refill requests, have your pharmacy contact our office and allow 72 hours for refills to be completed.    Today you received the following chemotherapy and/or immunotherapy agents Vidaza      To help prevent nausea and vomiting after your treatment, we encourage you to take your nausea medication as directed.  BELOW ARE SYMPTOMS THAT SHOULD BE REPORTED IMMEDIATELY: *FEVER GREATER THAN 100.4 F (38 C) OR HIGHER *CHILLS OR SWEATING *NAUSEA AND VOMITING THAT IS NOT CONTROLLED WITH YOUR NAUSEA MEDICATION *UNUSUAL SHORTNESS OF BREATH *UNUSUAL BRUISING OR BLEEDING *URINARY PROBLEMS (pain or burning when urinating, or frequent urination) *BOWEL PROBLEMS (unusual diarrhea, constipation, pain near the anus) TENDERNESS IN MOUTH AND THROAT WITH OR WITHOUT PRESENCE OF ULCERS (sore throat, sores in mouth, or a toothache) UNUSUAL RASH, SWELLING OR PAIN  UNUSUAL VAGINAL DISCHARGE OR ITCHING   Items with * indicate a potential emergency and should be followed up as soon as possible or go to the Emergency Department if any problems should occur.  Please show the CHEMOTHERAPY ALERT CARD or IMMUNOTHERAPY ALERT CARD at check-in to the  Emergency Department and triage nurse.  Should you have questions after your visit or need to cancel or reschedule your appointment, please contact [facility]  [phone] and follow the prompts.  Office hours are 8:00 a.m. to 4:30 p.m. Monday - Friday. Please note that voicemails left after 4:00 p.m. may not be returned until the following business day.  We are closed weekends and major holidays. You have access to a nurse at all times for urgent questions. Please call the main number to the clinic [phone] and follow the prompts.  For any non-urgent questions, you may also contact your provider using MyChart. We now offer e-Visits for anyone 18 and older to request care online for non-urgent symptoms. For details visit [email or web address].   Also download the MyChart app! Go to the app store, search "MyChart", open the app, select [place], and log in with your MyChart username and password.  Masks are optional in the cancer centers. If you would like for your care team to wear a mask while they are taking care of you, please let them know. For doctor visits, patients may have with them one support person who is at least 72 years old. At this time, visitors are not allowed in the infusion area.   

## 2021-10-21 NOTE — Progress Notes (Signed)
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 Most recent bone marrow biopsy on 07/09/21 revealed an increased blast count to 5%. Patient was reinitiating venetoclax 14 on/14 off on 08/26/21.  Reason for Consult: Oral chemotherapy follow-up for venetoclax therapy.   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   s/p surgery and XRT   Osteoporosis    Other and unspecified hyperlipidemia    Palpitations    Personal history of chemotherapy    Personal history of malignant neoplasm of large intestine    carcinoma - cecum, s/p right laparoscopic colectomy - s/p chemotherapy and XRT   Personal history of radiation therapy    Pneumonia 2019   PONV (postoperative nausea and vomiting)    Pure hypercholesterolemia    Unspecified hereditary and idiopathic peripheral neuropathy     HEMATOLOGY/ONCOLOGY HISTORY:  Oncology History  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[place])  04/12/2020 Initial Diagnosis   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[place])   06/10/2020 -  Chemotherapy   Patient is on Treatment Plan : AML dose reduced azacitidine SQ D1-5 q28d       ALLERGIES:  is allergic to demeclocycline,  sulfa antibiotics, tetracyclines & related, augmentin [amoxicillin-pot clavulanate], bentyl [dicyclomine hcl], ciprofloxacin, codeine, dicyclomine hcl, epinephrine, flagyl [metronidazole], librax [chlordiazepoxide-clidinium], novocain [procaine], phenobarbital, prednisone, and ultram [tramadol].  MEDICATIONS:  Current Outpatient Medications  Medication Sig Dispense Refill   Azelastine-Fluticasone 137-50 MCG/ACT SUSP SPRAY 2 SPRAYS INTO EACH NOSTRIL EVERY DAY 23 g 3   clonazePAM (KLONOPIN) 0.5 MG tablet Take 1.5 mg by mouth 2 (two) times daily.   0   clotrimazole (LOTRIMIN) 1 % cream Apply 1 application. topically 2 (two) times daily. 30 g 0   diphenoxylate-atropine (LOMOTIL) 2.5-0.025 MG tablet Take 1 tablet by mouth 4 (four) times daily as needed for diarrhea or loose stools. 60 tablet 1   escitalopram (LEXAPRO) 20 MG tablet Take 20 mg by mouth every morning.      levothyroxine (SYNTHROID) 125 MCG tablet TAKE 1 TABLET EVERY DAY ON EMPTY STOMACHWITH A GLASS OF WATER AT LEAST 30-60 MINBEFORE BREAKFAST 90 tablet 1   loperamide (IMODIUM) 2 MG capsule Take by mouth.     metoprolol tartrate (LOPRESSOR) 25 MG tablet TAKE 1 TABLET (25 MG TOTAL) BY MOUTH AS NEEDED (AS NEEDED UP TO TWICE DAILY FOR PALPITATIONS). 60 tablet 1   ondansetron (ZOFRAN) 8 MG tablet Take 1 tablet (8 mg total) by mouth 2 (two) times daily as needed for nausea or vomiting. 20 tablet 0   pantoprazole (PROTONIX) 40 MG tablet TAKE ONE TABLET (40 MG) BY MOUTH  EVERY DAY 90 tablet 2   potassium chloride SA (KLOR-CON M) 20 MEQ tablet TAKE ONE (1) TABLET BY MOUTH TWO TIMES PER DAY 60 tablet 0   valACYclovir (VALTREX) 500 MG tablet Take 1 tablet (500 mg total) by mouth daily. 90 tablet 3   venetoclax (VENCLEXTA) 100 MG tablet Take 4 tablets (400 mg total) by mouth daily. Take for 14 days, then hold for 14 days. Repeat every 28 days. Take with a meal and a full glass of water. 56 tablet 1   Current Facility-Administered Medications   Medication Dose Route Frequency Provider Last Rate Last Admin   ondansetron (ZOFRAN) tablet 8 mg  8 mg Oral Once Guse, Jacquelynn Cree, FNP       Facility-Administered Medications Ordered in Other Visits  Medication Dose Route Frequency Provider Last Rate Last Admin   azaCITIDine (VIDAZA) chemo injection 87.5 mg  50 mg/m2 (Treatment Plan Recorded) Subcutaneous Once Lloyd Huger, MD       potassium chloride (KLOR-CON) CR tablet 40 mEq  40 mEq Oral BID Lloyd Huger, MD   40 mEq at 07/08/20 1136    VITAL SIGNS: There were no vitals taken for this visit. There were no vitals filed for this visit.  Estimated body mass index is 22.29 kg/m as calculated from the following:   Height as of an earlier encounter on 10/21/21: 5' 4.5" (1.638 m).   Weight as of an earlier encounter on 10/21/21: 59.8 kg (131 lb 14.4 oz).  LABS: CBC:    Component Value Date/Time   WBC 1.1 (LL) 10/20/2021 1314   HGB 12.4 10/20/2021 1314   HGB 12.5 05/09/2018 1147   HCT 35.7 (L) 10/20/2021 1314   HCT 36.8 05/09/2018 1147   PLT 154 10/20/2021 1314   PLT 285 05/09/2018 1147   MCV 86.9 10/20/2021 1314   MCV 86 05/09/2018 1147   MCV 89 11/30/2013 1419   NEUTROABS 0.5 (L) 10/20/2021 1314   NEUTROABS 2.9 11/20/2013 1404   LYMPHSABS 0.4 (L) 10/20/2021 1314   LYMPHSABS 0.9 (L) 11/20/2013 1404   MONOABS 0.1 10/20/2021 1314   MONOABS 0.2 11/20/2013 1404   EOSABS 0.0 10/20/2021 1314   EOSABS 0.1 11/20/2013 1404   BASOSABS 0.0 10/20/2021 1314   BASOSABS 0.0 11/20/2013 1404   Comprehensive Metabolic Panel:    Component Value Date/Time   NA 143 10/20/2021 1314   NA 143 05/09/2018 1147   NA 145 11/30/2013 1419   K 3.3 (L) 10/20/2021 1314   K 3.5 11/30/2013 1419   CL 107 10/20/2021 1314   CL 109 (H) 11/30/2013 1419   CO2 26 10/20/2021 1314   CO2 29 11/30/2013 1419   BUN 17 10/20/2021 1314   BUN 11 05/09/2018 1147   BUN 15 11/30/2013 1419   CREATININE 0.89 10/20/2021 1314   CREATININE 1.04 11/30/2013  1419   GLUCOSE 100 (H) 10/20/2021 1314   GLUCOSE 97 11/30/2013 1419   CALCIUM 8.3 (L) 10/20/2021 1314   CALCIUM 8.7 11/30/2013 1419   AST 15 10/20/2021 1314   AST 28 11/30/2013 1419   ALT 28 10/20/2021 1314   ALT 71 (H) 11/30/2013 1419   ALKPHOS 114 10/20/2021 1314   ALKPHOS 114 11/30/2013 1419   BILITOT 0.9 10/20/2021 1314   BILITOT 0.7 11/30/2013 1419   PROT 6.5 10/20/2021 1314   PROT 7.1 11/30/2013 1419   ALBUMIN 3.5 10/20/2021 1314   ALBUMIN 3.3 (L) 11/30/2013 1419     Present during today's visit: patient only  Assessment  and Plan: Continue venetoclax 456m 14on/14 off ANC remains at 0.5, will continue to monitor neutropenia and reconsider bacterial/fungal ppx if ANC decreases. Hypokalemia: potassium decreased, will follow-up with patient to ensure taking potassium supplement given variable adherence.    Oral Chemotherapy Side Effect/Intolerance:  Diarrhea: patient had diarrhea yesterday (10/20/21) following day one of Vidaza, did not take lomotil. Patient reported this is typical for week one of cycle and resolves after. Fatigue: mild, she has good days and days were she feels more tired, takes naps as needed during venetoclax cycle. Mouth sores: none reported currently, said last cycle of mucositis cleared with the clotrimazole cream and she knows to use if mouth sores return. No reported edema, rash, nausea, muscle pain  Oral Chemotherapy Adherence: no missed doses reported No patient barriers to medication adherence identified.   New medications: none reported  Medication Access Issues: no issues, [facility] Patient expressed understanding and was in agreement with this plan. She also understands that She can call clinic at any time with any questions, concerns, or complaints.   Follow-up plan: RTC in 4 weeks  Thank you for allowing me to participate in the care of this very pleasant patient.   Time Total: 15 mins  Visit consisted  of counseling and education on dealing with issues of symptom management in the setting of serious and potentially life-threatening illness.Greater than 50%  of this time was spent counseling and coordinating care related to the above assessment and plan.  Signed by: ADarl Pikes PharmD, BCPS, BSalley Slaughter CPP Hematology/Oncology Clinical Pharmacist Practitioner [place]/DB/AP [phone] 10/21/2021 2:19 PM

## 2021-10-22 ENCOUNTER — Inpatient Hospital Stay: Payer: PPO

## 2021-10-22 VITALS — BP 130/67 | HR 79 | Temp 97.6°F | Resp 17

## 2021-10-22 DIAGNOSIS — C9200 Acute myeloblastic leukemia, not having achieved remission: Secondary | ICD-10-CM

## 2021-10-22 DIAGNOSIS — Z5111 Encounter for antineoplastic chemotherapy: Secondary | ICD-10-CM | POA: Diagnosis not present

## 2021-10-22 MED ORDER — ONDANSETRON HCL 4 MG PO TABS
8.0000 mg | ORAL_TABLET | Freq: Once | ORAL | Status: AC
  Administered 2021-10-22: 8 mg via ORAL
  Filled 2021-10-22: qty 2

## 2021-10-22 MED ORDER — AZACITIDINE CHEMO SQ INJECTION
50.0000 mg/m2 | Freq: Once | INTRAMUSCULAR | Status: AC
  Administered 2021-10-22: 87.5 mg via SUBCUTANEOUS
  Filled 2021-10-22: qty 3.5

## 2021-10-22 NOTE — Patient Instructions (Signed)
[facility]  Discharge Instructions: Thank you for choosing [facility] to provide your oncology and hematology care.  If you have a lab appointment with the Cancer Center, please go directly to the Cancer Center and check in at the registration area.  Wear comfortable clothing and clothing appropriate for easy access to any Portacath or PICC line.   We strive to give you quality time with your provider. You may need to reschedule your appointment if you arrive late (15 or more minutes).  Arriving late affects you and other patients whose appointments are after yours.  Also, if you miss three or more appointments without notifying the office, you may be dismissed from the clinic at the provider's discretion.      For prescription refill requests, have your pharmacy contact our office and allow 72 hours for refills to be completed.    Today you received the following chemotherapy and/or immunotherapy agents Vidaza      To help prevent nausea and vomiting after your treatment, we encourage you to take your nausea medication as directed.  BELOW ARE SYMPTOMS THAT SHOULD BE REPORTED IMMEDIATELY: *FEVER GREATER THAN 100.4 F (38 C) OR HIGHER *CHILLS OR SWEATING *NAUSEA AND VOMITING THAT IS NOT CONTROLLED WITH YOUR NAUSEA MEDICATION *UNUSUAL SHORTNESS OF BREATH *UNUSUAL BRUISING OR BLEEDING *URINARY PROBLEMS (pain or burning when urinating, or frequent urination) *BOWEL PROBLEMS (unusual diarrhea, constipation, pain near the anus) TENDERNESS IN MOUTH AND THROAT WITH OR WITHOUT PRESENCE OF ULCERS (sore throat, sores in mouth, or a toothache) UNUSUAL RASH, SWELLING OR PAIN  UNUSUAL VAGINAL DISCHARGE OR ITCHING   Items with * indicate a potential emergency and should be followed up as soon as possible or go to the Emergency Department if any problems should occur.  Please show the CHEMOTHERAPY ALERT CARD or IMMUNOTHERAPY ALERT CARD at check-in to the  Emergency Department and triage nurse.  Should you have questions after your visit or need to cancel or reschedule your appointment, please contact [facility]  [phone] and follow the prompts.  Office hours are 8:00 a.m. to 4:30 p.m. Monday - Friday. Please note that voicemails left after 4:00 p.m. may not be returned until the following business day.  We are closed weekends and major holidays. You have access to a nurse at all times for urgent questions. Please call the main number to the clinic [phone] and follow the prompts.  For any non-urgent questions, you may also contact your provider using MyChart. We now offer e-Visits for anyone 18 and older to request care online for non-urgent symptoms. For details visit [email or web address].   Also download the MyChart app! Go to the app store, search "MyChart", open the app, select [place], and log in with your MyChart username and password.  Masks are optional in the cancer centers. If you would like for your care team to wear a mask while they are taking care of you, please let them know. For doctor visits, patients may have with them one support person who is at least 72 years old. At this time, visitors are not allowed in the infusion area.   

## 2021-10-23 ENCOUNTER — Inpatient Hospital Stay: Payer: PPO

## 2021-10-23 ENCOUNTER — Ambulatory Visit
Admit: 2021-10-23 | Discharge: 2021-10-24 | Payer: PRIVATE HEALTH INSURANCE | Attending: Hematology | Primary: Hematology

## 2021-10-23 ENCOUNTER — Other Ambulatory Visit: Admit: 2021-10-23 | Discharge: 2021-10-24 | Payer: PRIVATE HEALTH INSURANCE

## 2021-10-23 DIAGNOSIS — C9201 Acute myeloblastic leukemia, in remission: Secondary | ICD-10-CM | POA: Diagnosis not present

## 2021-10-23 MED ORDER — ONDANSETRON HCL 4 MG TABLET
ORAL_TABLET | Freq: Three times a day (TID) | ORAL | 2 refills | 30 days | Status: CP | PRN
Start: 2021-10-23 — End: ?

## 2021-10-24 ENCOUNTER — Inpatient Hospital Stay: Payer: PPO

## 2021-10-24 VITALS — BP 134/73 | HR 87 | Temp 97.7°F | Resp 18

## 2021-10-24 DIAGNOSIS — C9200 Acute myeloblastic leukemia, not having achieved remission: Secondary | ICD-10-CM

## 2021-10-24 DIAGNOSIS — Z5111 Encounter for antineoplastic chemotherapy: Secondary | ICD-10-CM | POA: Diagnosis not present

## 2021-10-24 MED ORDER — AZACITIDINE CHEMO SQ INJECTION
50.0000 mg/m2 | Freq: Once | INTRAMUSCULAR | Status: AC
  Administered 2021-10-24: 87.5 mg via SUBCUTANEOUS
  Filled 2021-10-24: qty 3.5

## 2021-10-24 MED ORDER — ONDANSETRON HCL 4 MG PO TABS
8.0000 mg | ORAL_TABLET | Freq: Once | ORAL | Status: AC
  Administered 2021-10-24: 8 mg via ORAL
  Filled 2021-10-24: qty 2

## 2021-11-10 ENCOUNTER — Other Ambulatory Visit (HOSPITAL_COMMUNITY): Payer: Self-pay

## 2021-11-12 ENCOUNTER — Other Ambulatory Visit (HOSPITAL_COMMUNITY): Payer: Self-pay

## 2021-11-17 ENCOUNTER — Inpatient Hospital Stay: Payer: PPO | Attending: Oncology

## 2021-11-17 ENCOUNTER — Other Ambulatory Visit: Payer: Self-pay | Admitting: *Deleted

## 2021-11-17 ENCOUNTER — Inpatient Hospital Stay: Payer: PPO

## 2021-11-17 VITALS — BP 127/69 | HR 88 | Temp 99.1°F | Resp 20 | Wt 129.9 lb

## 2021-11-17 DIAGNOSIS — E876 Hypokalemia: Secondary | ICD-10-CM | POA: Diagnosis not present

## 2021-11-17 DIAGNOSIS — R300 Dysuria: Secondary | ICD-10-CM | POA: Insufficient documentation

## 2021-11-17 DIAGNOSIS — C9200 Acute myeloblastic leukemia, not having achieved remission: Secondary | ICD-10-CM | POA: Diagnosis not present

## 2021-11-17 DIAGNOSIS — T451X5D Adverse effect of antineoplastic and immunosuppressive drugs, subsequent encounter: Secondary | ICD-10-CM | POA: Insufficient documentation

## 2021-11-17 DIAGNOSIS — Z5111 Encounter for antineoplastic chemotherapy: Secondary | ICD-10-CM | POA: Insufficient documentation

## 2021-11-17 DIAGNOSIS — Z79624 Long term (current) use of inhibitors of nucleotide synthesis: Secondary | ICD-10-CM | POA: Diagnosis not present

## 2021-11-17 DIAGNOSIS — Z853 Personal history of malignant neoplasm of breast: Secondary | ICD-10-CM | POA: Diagnosis not present

## 2021-11-17 DIAGNOSIS — Z85850 Personal history of malignant neoplasm of thyroid: Secondary | ICD-10-CM | POA: Diagnosis not present

## 2021-11-17 DIAGNOSIS — R197 Diarrhea, unspecified: Secondary | ICD-10-CM | POA: Insufficient documentation

## 2021-11-17 DIAGNOSIS — Z85038 Personal history of other malignant neoplasm of large intestine: Secondary | ICD-10-CM | POA: Diagnosis not present

## 2021-11-17 DIAGNOSIS — D701 Agranulocytosis secondary to cancer chemotherapy: Secondary | ICD-10-CM | POA: Insufficient documentation

## 2021-11-17 DIAGNOSIS — Z79899 Other long term (current) drug therapy: Secondary | ICD-10-CM | POA: Diagnosis not present

## 2021-11-17 DIAGNOSIS — Z7969 Long term (current) use of other immunomodulators and immunosuppressants: Secondary | ICD-10-CM | POA: Diagnosis not present

## 2021-11-17 DIAGNOSIS — R350 Frequency of micturition: Secondary | ICD-10-CM | POA: Insufficient documentation

## 2021-11-17 LAB — CBC WITH DIFFERENTIAL/PLATELET
Abs Immature Granulocytes: 0 10*3/uL (ref 0.00–0.07)
Basophils Absolute: 0 10*3/uL (ref 0.0–0.1)
Basophils Relative: 1 %
Eosinophils Absolute: 0 10*3/uL (ref 0.0–0.5)
Eosinophils Relative: 0 %
HCT: 36.8 % (ref 36.0–46.0)
Hemoglobin: 12.7 g/dL (ref 12.0–15.0)
Immature Granulocytes: 0 %
Lymphocytes Relative: 30 %
Lymphs Abs: 0.5 10*3/uL — ABNORMAL LOW (ref 0.7–4.0)
MCH: 30.5 pg (ref 26.0–34.0)
MCHC: 34.5 g/dL (ref 30.0–36.0)
MCV: 88.2 fL (ref 80.0–100.0)
Monocytes Absolute: 0.2 10*3/uL (ref 0.1–1.0)
Monocytes Relative: 12 %
Neutro Abs: 1 10*3/uL — ABNORMAL LOW (ref 1.7–7.7)
Neutrophils Relative %: 57 %
Platelets: 183 10*3/uL (ref 150–400)
RBC: 4.17 MIL/uL (ref 3.87–5.11)
RDW: 15.3 % (ref 11.5–15.5)
WBC: 1.7 10*3/uL — ABNORMAL LOW (ref 4.0–10.5)
nRBC: 0 % (ref 0.0–0.2)

## 2021-11-17 LAB — COMPREHENSIVE METABOLIC PANEL
ALT: 26 U/L (ref 0–44)
AST: 16 U/L (ref 15–41)
Albumin: 3.7 g/dL (ref 3.5–5.0)
Alkaline Phosphatase: 103 U/L (ref 38–126)
Anion gap: 4 — ABNORMAL LOW (ref 5–15)
BUN: 17 mg/dL (ref 8–23)
CO2: 24 mmol/L (ref 22–32)
Calcium: 8.9 mg/dL (ref 8.9–10.3)
Chloride: 113 mmol/L — ABNORMAL HIGH (ref 98–111)
Creatinine, Ser: 1.05 mg/dL — ABNORMAL HIGH (ref 0.44–1.00)
GFR, Estimated: 56 mL/min — ABNORMAL LOW (ref >60)
Glucose, Bld: 119 mg/dL — ABNORMAL HIGH (ref 70–99)
Potassium: 3.2 mmol/L — ABNORMAL LOW (ref 3.5–5.1)
Sodium: 141 mmol/L (ref 135–145)
Total Bilirubin: 0.8 mg/dL (ref 0.3–1.2)
Total Protein: 6.6 g/dL (ref 6.5–8.1)

## 2021-11-17 MED ORDER — ONDANSETRON HCL 4 MG PO TABS
8.0000 mg | ORAL_TABLET | Freq: Once | ORAL | Status: AC
  Administered 2021-11-17: 8 mg via ORAL
  Filled 2021-11-17: qty 2

## 2021-11-17 MED ORDER — AZACITIDINE CHEMO SQ INJECTION
50.0000 mg/m2 | Freq: Once | INTRAMUSCULAR | Status: AC
  Administered 2021-11-17: 87.5 mg via SUBCUTANEOUS
  Filled 2021-11-17: qty 3.5

## 2021-11-17 MED ORDER — POTASSIUM CHLORIDE CRYS ER 20 MEQ PO TBCR: EXTENDED_RELEASE_TABLET | ORAL | 1 refills | Status: DC

## 2021-11-17 NOTE — Patient Instructions (Signed)
[facility]  Discharge Instructions: Thank you for choosing [facility] to provide your oncology and hematology care.  If you have a lab appointment with the Cancer Center, please go directly to the Cancer Center and check in at the registration area.  Wear comfortable clothing and clothing appropriate for easy access to any Portacath or PICC line.   We strive to give you quality time with your provider. You may need to reschedule your appointment if you arrive late (15 or more minutes).  Arriving late affects you and other patients whose appointments are after yours.  Also, if you miss three or more appointments without notifying the office, you may be dismissed from the clinic at the provider's discretion.      For prescription refill requests, have your pharmacy contact our office and allow 72 hours for refills to be completed.    Today you received the following chemotherapy and/or immunotherapy agents Vidaza      To help prevent nausea and vomiting after your treatment, we encourage you to take your nausea medication as directed.  BELOW ARE SYMPTOMS THAT SHOULD BE REPORTED IMMEDIATELY: *FEVER GREATER THAN 100.4 F (38 C) OR HIGHER *CHILLS OR SWEATING *NAUSEA AND VOMITING THAT IS NOT CONTROLLED WITH YOUR NAUSEA MEDICATION *UNUSUAL SHORTNESS OF BREATH *UNUSUAL BRUISING OR BLEEDING *URINARY PROBLEMS (pain or burning when urinating, or frequent urination) *BOWEL PROBLEMS (unusual diarrhea, constipation, pain near the anus) TENDERNESS IN MOUTH AND THROAT WITH OR WITHOUT PRESENCE OF ULCERS (sore throat, sores in mouth, or a toothache) UNUSUAL RASH, SWELLING OR PAIN  UNUSUAL VAGINAL DISCHARGE OR ITCHING   Items with * indicate a potential emergency and should be followed up as soon as possible or go to the Emergency Department if any problems should occur.  Please show the CHEMOTHERAPY ALERT CARD or IMMUNOTHERAPY ALERT CARD at check-in to the  Emergency Department and triage nurse.  Should you have questions after your visit or need to cancel or reschedule your appointment, please contact [facility]  [phone] and follow the prompts.  Office hours are 8:00 a.m. to 4:30 p.m. Monday - Friday. Please note that voicemails left after 4:00 p.m. may not be returned until the following business day.  We are closed weekends and major holidays. You have access to a nurse at all times for urgent questions. Please call the main number to the clinic [phone] and follow the prompts.  For any non-urgent questions, you may also contact your provider using MyChart. We now offer e-Visits for anyone 18 and older to request care online for non-urgent symptoms. For details visit [email or web address].   Also download the MyChart app! Go to the app store, search "MyChart", open the app, select [facility], and log in with your MyChart username and password.  Masks are optional in the cancer centers. If you would like for your care team to wear a mask while they are taking care of you, please let them know. For doctor visits, patients may have with them one support person who is at least 72 years old. At this time, visitors are not allowed in the infusion area.   

## 2021-11-18 ENCOUNTER — Inpatient Hospital Stay (HOSPITAL_BASED_OUTPATIENT_CLINIC_OR_DEPARTMENT_OTHER): Payer: PPO | Admitting: Oncology

## 2021-11-18 ENCOUNTER — Inpatient Hospital Stay: Payer: PPO

## 2021-11-18 ENCOUNTER — Other Ambulatory Visit: Payer: PPO

## 2021-11-18 ENCOUNTER — Ambulatory Visit: Payer: PPO | Admitting: Oncology

## 2021-11-18 ENCOUNTER — Inpatient Hospital Stay: Payer: PPO | Admitting: Pharmacist

## 2021-11-18 ENCOUNTER — Other Ambulatory Visit (HOSPITAL_COMMUNITY): Payer: Self-pay

## 2021-11-18 ENCOUNTER — Encounter: Payer: Self-pay | Admitting: Oncology

## 2021-11-18 VITALS — BP 124/68 | HR 72 | Resp 18 | Wt 131.0 lb

## 2021-11-18 DIAGNOSIS — R197 Diarrhea, unspecified: Secondary | ICD-10-CM

## 2021-11-18 DIAGNOSIS — Z5111 Encounter for antineoplastic chemotherapy: Secondary | ICD-10-CM | POA: Diagnosis not present

## 2021-11-18 DIAGNOSIS — C9200 Acute myeloblastic leukemia, not having achieved remission: Secondary | ICD-10-CM

## 2021-11-18 DIAGNOSIS — K7689 Other specified diseases of liver: Secondary | ICD-10-CM | POA: Insufficient documentation

## 2021-11-18 MED ORDER — VENETOCLAX 100 MG PO TABS
400.0000 mg | ORAL_TABLET | Freq: Every day | ORAL | 1 refills | Status: DC
  Filled 2021-11-18 – 2021-12-12 (×2): qty 56, 14d supply, fill #0
  Filled 2022-01-01: qty 56, 28d supply, fill #1

## 2021-11-18 MED ORDER — AZACITIDINE CHEMO SQ INJECTION
50.0000 mg/m2 | Freq: Once | INTRAMUSCULAR | Status: AC
  Administered 2021-11-18: 87.5 mg via SUBCUTANEOUS
  Filled 2021-11-18: qty 3.5

## 2021-11-18 MED ORDER — ONDANSETRON HCL 4 MG PO TABS
8.0000 mg | ORAL_TABLET | Freq: Once | ORAL | Status: AC
  Administered 2021-11-18: 8 mg via ORAL
  Filled 2021-11-18: qty 2

## 2021-11-18 NOTE — Patient Instructions (Signed)
[facility]  Discharge Instructions: Thank you for choosing [facility] to provide your oncology and hematology care.  If you have a lab appointment with the Cancer Center, please go directly to the Cancer Center and check in at the registration area.  Wear comfortable clothing and clothing appropriate for easy access to any Portacath or PICC line.   We strive to give you quality time with your provider. You may need to reschedule your appointment if you arrive late (15 or more minutes).  Arriving late affects you and other patients whose appointments are after yours.  Also, if you miss three or more appointments without notifying the office, you may be dismissed from the clinic at the provider's discretion.      For prescription refill requests, have your pharmacy contact our office and allow 72 hours for refills to be completed.    Today you received the following chemotherapy and/or immunotherapy agents Vidaza      To help prevent nausea and vomiting after your treatment, we encourage you to take your nausea medication as directed.  BELOW ARE SYMPTOMS THAT SHOULD BE REPORTED IMMEDIATELY: *FEVER GREATER THAN 100.4 F (38 C) OR HIGHER *CHILLS OR SWEATING *NAUSEA AND VOMITING THAT IS NOT CONTROLLED WITH YOUR NAUSEA MEDICATION *UNUSUAL SHORTNESS OF BREATH *UNUSUAL BRUISING OR BLEEDING *URINARY PROBLEMS (pain or burning when urinating, or frequent urination) *BOWEL PROBLEMS (unusual diarrhea, constipation, pain near the anus) TENDERNESS IN MOUTH AND THROAT WITH OR WITHOUT PRESENCE OF ULCERS (sore throat, sores in mouth, or a toothache) UNUSUAL RASH, SWELLING OR PAIN  UNUSUAL VAGINAL DISCHARGE OR ITCHING   Items with * indicate a potential emergency and should be followed up as soon as possible or go to the Emergency Department if any problems should occur.  Please show the CHEMOTHERAPY ALERT CARD or IMMUNOTHERAPY ALERT CARD at check-in to the  Emergency Department and triage nurse.  Should you have questions after your visit or need to cancel or reschedule your appointment, please contact [facility]  [phone] and follow the prompts.  Office hours are 8:00 a.m. to 4:30 p.m. Monday - Friday. Please note that voicemails left after 4:00 p.m. may not be returned until the following business day.  We are closed weekends and major holidays. You have access to a nurse at all times for urgent questions. Please call the main number to the clinic [phone] and follow the prompts.  For any non-urgent questions, you may also contact your provider using MyChart. We now offer e-Visits for anyone 18 and older to request care online for non-urgent symptoms. For details visit [email or web address].   Also download the MyChart app! Go to the app store, search "MyChart", open the app, select [facility], and log in with your MyChart username and password.  Masks are optional in the cancer centers. If you would like for your care team to wear a mask while they are taking care of you, please let them know. For doctor visits, patients may have with them one support person who is at least 72 years old. At this time, visitors are not allowed in the infusion area.   

## 2021-11-18 NOTE — Progress Notes (Signed)
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   Bone marrow biopsy on 07/09/21 revealed an increased blast count to 5%, at that time patient was on azacitidine monotherapy. Patient reinitiated venetoclax 14 on/14 off on 08/26/21.  Reason for Consult: Oral chemotherapy follow-up for venetoclax therapy.   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   s/p surgery and XRT   Osteoporosis    Other and unspecified hyperlipidemia    Palpitations    Personal history of chemotherapy    Personal history of malignant neoplasm of large intestine    carcinoma - cecum, s/p right laparoscopic colectomy - s/p chemotherapy and XRT   Personal history of radiation therapy    Pneumonia 2019   PONV (postoperative nausea and vomiting)    Pure hypercholesterolemia    Unspecified hereditary and idiopathic peripheral neuropathy     HEMATOLOGY/ONCOLOGY HISTORY:  Oncology History  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  MEDICATIONS:  Current Outpatient Medications  Medication Sig Dispense Refill   Azelastine-Fluticasone 137-50 MCG/ACT SUSP SPRAY 2 SPRAYS INTO EACH NOSTRIL EVERY DAY 23 g 3   clonazePAM (KLONOPIN) 0.5 MG tablet Take 1.5 mg by mouth 2 (two) times daily.   0   clotrimazole (LOTRIMIN) 1 % cream Apply 1 application. topically 2 (two) times daily. 30 g 0   diphenoxylate-atropine (LOMOTIL) 2.5-0.025 MG tablet Take 1 tablet by mouth 4 (four) times daily as needed for diarrhea or loose stools. 60 tablet 1   escitalopram (LEXAPRO) 20 MG tablet Take 20 mg by mouth every morning.      levothyroxine (SYNTHROID) 125 MCG tablet TAKE 1 TABLET EVERY DAY ON EMPTY STOMACHWITH A GLASS OF WATER AT LEAST 30-60 MINBEFORE BREAKFAST 90 tablet 1   loperamide (IMODIUM) 2 MG capsule Take by mouth.     metoprolol tartrate (LOPRESSOR) 25 MG tablet TAKE 1 TABLET (25 MG TOTAL) BY MOUTH AS NEEDED (AS NEEDED UP TO TWICE DAILY FOR PALPITATIONS). 60 tablet 1   ondansetron (ZOFRAN) 4 MG tablet Take 4-8 mg by mouth every 8 (eight) hours as needed.     ondansetron (ZOFRAN) 8 MG tablet Take 1 tablet (8 mg total) by  mouth 2 (two) times daily as needed for nausea or vomiting. 20 tablet 0   pantoprazole (PROTONIX) 40 MG tablet TAKE ONE TABLET (40 MG) BY MOUTH EVERY DAY 90 tablet 2   potassium chloride SA (KLOR-CON M) 20 MEQ tablet TAKE ONE (1) TABLET BY MOUTH TWO TIMES PER DAY 60 tablet 1   valACYclovir (VALTREX) 500 MG tablet Take 1 tablet (500 mg total) by mouth daily. 90 tablet 3   venetoclax (VENCLEXTA) 100 MG tablet Take 4 tablets (400 mg total) by mouth daily. Take for 14 days, then hold for 14  days. Repeat every 28 days. Take with a meal and a full glass of water. 56 tablet 1   Current Facility-Administered Medications  Medication Dose Route Frequency Provider Last Rate Last Admin   ondansetron (ZOFRAN) tablet 8 mg  8 mg Oral Once Guse, Jacquelynn Cree, FNP       Facility-Administered Medications Ordered in Other Visits  Medication Dose Route Frequency Provider Last Rate Last Admin   azaCITIDine (VIDAZA) chemo injection 87.5 mg  50 mg/m2 (Treatment Plan Recorded) Subcutaneous Once Lloyd Huger, MD       potassium chloride (KLOR-CON) CR tablet 40 mEq  40 mEq Oral BID Lloyd Huger, MD   40 mEq at 07/08/20 1136    VITAL SIGNS: There were no vitals taken for this visit. There were no vitals filed for this visit.  Estimated body mass index is 22.14 kg/m as calculated from the following:   Height as of 10/21/21: 5' 4.5" (1.638 m).   Weight as of an earlier encounter on 11/18/21: 59.4 kg (131 lb).  LABS: CBC:    Component Value Date/Time   WBC 1.7 (L) 11/17/2021 1330   HGB 12.7 11/17/2021 1330   HGB 12.5 05/09/2018 1147   HCT 36.8 11/17/2021 1330   HCT 36.8 05/09/2018 1147   PLT 183 11/17/2021 1330   PLT 285 05/09/2018 1147   MCV 88.2 11/17/2021 1330   MCV 86 05/09/2018 1147   MCV 89 11/30/2013 1419   NEUTROABS 1.0 (L) 11/17/2021 1330   NEUTROABS 2.9 11/20/2013 1404   LYMPHSABS 0.5 (L) 11/17/2021 1330   LYMPHSABS 0.9 (L) 11/20/2013 1404   MONOABS 0.2 11/17/2021 1330   MONOABS 0.2 11/20/2013 1404   EOSABS 0.0 11/17/2021 1330   EOSABS 0.1 11/20/2013 1404   BASOSABS 0.0 11/17/2021 1330   BASOSABS 0.0 11/20/2013 1404   Comprehensive Metabolic Panel:    Component Value Date/Time   NA 141 11/17/2021 1330   NA 143 05/09/2018 1147   NA 145 11/30/2013 1419   K 3.2 (L) 11/17/2021 1330   K 3.5 11/30/2013 1419   CL 113 (H) 11/17/2021 1330   CL 109 (H) 11/30/2013 1419   CO2 24 11/17/2021 1330   CO2 29 11/30/2013 1419   BUN 17 11/17/2021 1330   BUN 11 05/09/2018  1147   BUN 15 11/30/2013 1419   CREATININE 1.05 (H) 11/17/2021 1330   CREATININE 1.04 11/30/2013 1419   GLUCOSE 119 (H) 11/17/2021 1330   GLUCOSE 97 11/30/2013 1419   CALCIUM 8.9 11/17/2021 1330   CALCIUM 8.7 11/30/2013 1419   AST 16 11/17/2021 1330   AST 28 11/30/2013 1419   ALT 26 11/17/2021 1330   ALT 71 (H) 11/30/2013 1419   ALKPHOS 103 11/17/2021 1330   ALKPHOS 114 11/30/2013 1419   BILITOT 0.8 11/17/2021 1330   BILITOT 0.7 11/30/2013 1419   PROT 6.6 11/17/2021 1330   PROT 7.1 11/30/2013 1419   ALBUMIN  3.7 11/17/2021 1330   ALBUMIN 3.3 (L) 11/30/2013 1419     Present during today's visit: patient only  Assessment and Plan: Continue venetoclax 423m 14on/14 off Reviewed CBC, ANC has improved to 1.0, will continue to monitor. Suggested patient switch to taking her venetoclax with dinner due to daytime fatigue.     Oral Chemotherapy Side Effect/Intolerance:  Diarrhea: patient is able to manage her diarrhea with loperamide as needed. Reminded patient to stay hydrated.  Fatigue: see above suggestion for administration.  Nausea: mild, occasional nausea not requiring antiemetics.  No reported edema, rash, muscle pain, mouth sores.  Oral Chemotherapy Adherence: no missed doses reported No patient barriers to medication adherence identified.   New medications: none reported  Medication Access Issues: no issues, [facility] Patient expressed understanding and was in agreement with this plan. She also understands that She can call clinic at any time with any questions, concerns, or complaints.   Follow-up plan: RTC for provider visit in 4 weeks  Thank you for allowing me to participate in the care of this very pleasant patient.   Time Total: 15 mins  Visit consisted of counseling and education on dealing with issues of symptom management in the setting of serious and potentially life-threatening illness.Greater than 50%  of this time was  spent counseling and coordinating care related to the above assessment and plan.  Signed by: ADarl Pikes PharmD, BCPS, BSalley Slaughter CPP Hematology/Oncology Clinical Pharmacist Practitioner [place]/DB/AP [phone] 11/18/2021 2:17 PM

## 2021-11-18 NOTE — Progress Notes (Signed)
[place]  [phone] [phone]  ID: LANYIA JEWEL OB: 10-25-1950  MR#: [id number]  CSN#:[id number]  Patient Care Team: [name], MD as PCP - General (Internal Medicine) Wellington Hampshire, MD as PCP - Cardiology (Cardiology)  CHIEF COMPLAINT: AML.  INTERVAL HISTORY: Mrs. Ke is a 71 year old female who presents to clinic today for further evaluation and continuation of treatment.  She started neck cycle of Vidaza and venetoclax yesterday.  Current regimen is Vidaza day 1 through 5 every 28 days venetoclax 4 tablets daily 2 weeks on 2 weeks off due to poor tolerance.  She continues to feel tired and all she wants to do a sleep.  Appetite is fair.  States she typically eats 1 meal per day first thing in the morning.  Overall feels okay.  Denies any fevers.  REVIEW OF SYSTEMS:   Review of Systems  Constitutional:  Positive for malaise/fatigue. Negative for chills, fever and weight loss.  HENT:  Negative for congestion, ear pain and tinnitus.   Eyes: Negative.  Negative for blurred vision and double vision.  Respiratory: Negative.  Negative for cough, sputum production and shortness of breath.   Cardiovascular: Negative.  Negative for chest pain, palpitations and leg swelling.  Gastrointestinal: Negative.  Negative for abdominal pain, constipation, diarrhea, nausea and vomiting.  Genitourinary:  Negative for dysuria, frequency and urgency.  Musculoskeletal:  Negative for back pain and falls.  Skin: Negative.  Negative for rash.  Neurological:  Positive for weakness. Negative for headaches.  Endo/Heme/Allergies: Negative.  Does not bruise/bleed easily.  Psychiatric/Behavioral: Negative.  Negative for depression. The patient is not nervous/anxious and does not have insomnia.     As per HPI. Otherwise, a complete review of systems is negative.  PAST MEDICAL HISTORY: Past Medical History:  Diagnosis Date   Anxiety    Arthritis     Osteoarthritis   Breast cancer ([place]) 2009   right breast lumpectomy with rad tx   Colon cancer ([place])    surgery with chemo and rad tx   Complication of anesthesia    GERD (gastroesophageal reflux disease)    Heart palpitations    History of hiatal hernia    History of kidney stones    Hypothyroidism    Liver disease    Liver nodule    s/p negative biopsy   Malignant neoplasm of thyroid gland ([place]) 2002   s/p surgery and XRT   Osteoporosis    Other and unspecified hyperlipidemia    Palpitations    Personal history of chemotherapy    Personal history of malignant neoplasm of large intestine    carcinoma - cecum, s/p right laparoscopic colectomy - s/p chemotherapy and XRT   Personal history of radiation therapy    Pneumonia 2019   PONV (postoperative nausea and vomiting)    Pure hypercholesterolemia    Unspecified hereditary and idiopathic peripheral neuropathy     PAST SURGICAL HISTORY: Past Surgical History:  Procedure Laterality Date   APPENDECTOMY  1985   BREAST BIOPSY Right 2009   positive   BREAST BIOPSY Right 2009   negative   BREAST LUMPECTOMY Right 2009   breast cancer   CHOLECYSTECTOMY  1995   COLONOSCOPY     COLONOSCOPY WITH PROPOFOL N/A 10/29/2017   Procedure: COLONOSCOPY WITH PROPOFOL;  Surgeon: Manya Silvas, MD;  Location: [facility] ENDOSCOPY;  Service: Endoscopy;  Laterality: N/A;   DILATION AND CURETTAGE OF UTERUS  1990   DILATION AND CURETTAGE, DIAGNOSTIC / THERAPEUTIC  1990   ESOPHAGOGASTRODUODENOSCOPY     ESOPHAGOGASTRODUODENOSCOPY (EGD) WITH PROPOFOL N/A 10/29/2017   Procedure: ESOPHAGOGASTRODUODENOSCOPY (EGD) WITH PROPOFOL;  Surgeon: Manya Silvas, MD;  Location: [facility] ENDOSCOPY;  Service: Endoscopy;  Laterality: N/A;   INCISION AND DRAINAGE PERIRECTAL ABSCESS N/A 06/17/2020   Procedure: IRRIGATION AND DEBRIDEMENT PERIRECTAL ABSCESS;  Surgeon: Jules Husbands, MD;  Location: [facility] ORS;  Service: General;  Laterality: N/A;   IR BONE MARROW BIOPSY &  ASPIRATION  07/09/2021   LAPAROSCOPIC PARTIAL COLECTOMY     stage 3-C carcinoma of the cecum, s/p chemotherapy and xrt   LITHOTRIPSY     SIGMOIDOSCOPY  08/26/1993   THYROID LOBECTOMY  2002   s/p XRT   TOTAL HIP ARTHROPLASTY Right 10/24/2019   Procedure: TOTAL HIP ARTHROPLASTY;  Surgeon: Corky Mull, MD;  Location: [facility] ORS;  Service: Orthopedics;  Laterality: Right;    FAMILY HISTORY: Family History  Problem Relation Age of Onset   Stroke Mother        37s   Alzheimer's disease Mother    Lung cancer Father    Prostate cancer Father    Cancer Father        Colon   Colon cancer Father    Breast cancer Sister        75's   Lung cancer Sister    Breast cancer Maternal Aunt     ADVANCED DIRECTIVES (Y/N):  N  HEALTH MAINTENANCE: Social History   Tobacco Use   Smoking status: Never   Smokeless tobacco: Never  Vaping Use   Vaping Use: Never used  Substance Use Topics   Alcohol use: No    Alcohol/week: 0.0 standard drinks of alcohol   Drug use: No     Colonoscopy:  PAP:  Bone density:  Lipid panel:  Allergies  Allergen Reactions   Demeclocycline Other (See Comments)    Throat swells   Sulfa Antibiotics Other (See Comments)    Other reaction(s): Other (See Comments) Throat swells Other reaction(s): Other (See Comments) Throat swells   Tetracyclines & Related Other (See Comments)    Throat swells    Augmentin [Amoxicillin-Pot Clavulanate] Diarrhea   Bentyl [Dicyclomine Hcl]     unkn   Ciprofloxacin Diarrhea   Codeine Other (See Comments)    dizziness     Dicyclomine Hcl Other (See Comments)    unkn   Epinephrine     Speeds heart up - panic    Flagyl [Metronidazole] Nausea And Vomiting   Librax [Chlordiazepoxide-Clidinium]     unkn   Novocain [Procaine] Other (See Comments)    "Shaky"   Phenobarbital     unkn   Prednisone     Nervous     Ultram [Tramadol] Other (See Comments)    Sick feeling    Current Outpatient Medications  Medication Sig  Dispense Refill   Azelastine-Fluticasone 137-50 MCG/ACT SUSP SPRAY 2 SPRAYS INTO EACH NOSTRIL EVERY DAY 23 g 3   clonazePAM (KLONOPIN) 0.5 MG tablet Take 1.5 mg by mouth 2 (two) times daily.   0   clotrimazole (LOTRIMIN) 1 % cream Apply 1 application. topically 2 (two) times daily. 30 g 0   diphenoxylate-atropine (LOMOTIL) 2.5-0.025 MG tablet Take 1 tablet by mouth 4 (four) times daily as needed for diarrhea or loose stools. 60 tablet 1   escitalopram (LEXAPRO) 20 MG tablet Take 20 mg by mouth every morning.      levothyroxine (SYNTHROID) 125 MCG tablet TAKE 1 TABLET EVERY DAY ON EMPTY STOMACHWITH A  GLASS OF WATER AT LEAST 30-60 MINBEFORE BREAKFAST 90 tablet 1   loperamide (IMODIUM) 2 MG capsule Take by mouth.     metoprolol tartrate (LOPRESSOR) 25 MG tablet TAKE 1 TABLET (25 MG TOTAL) BY MOUTH AS NEEDED (AS NEEDED UP TO TWICE DAILY FOR PALPITATIONS). 60 tablet 1   ondansetron (ZOFRAN) 8 MG tablet Take 1 tablet (8 mg total) by mouth 2 (two) times daily as needed for nausea or vomiting. 20 tablet 0   pantoprazole (PROTONIX) 40 MG tablet TAKE ONE TABLET (40 MG) BY MOUTH EVERY DAY 90 tablet 2   potassium chloride SA (KLOR-CON M) 20 MEQ tablet TAKE ONE (1) TABLET BY MOUTH TWO TIMES PER DAY 60 tablet 1   valACYclovir (VALTREX) 500 MG tablet Take 1 tablet (500 mg total) by mouth daily. 90 tablet 3   venetoclax (VENCLEXTA) 100 MG tablet Take 4 tablets (400 mg total) by mouth daily. Take for 14 days, then hold for 14 days. Repeat every 28 days. Take with a meal and a full glass of water. 56 tablet 1   Current Facility-Administered Medications  Medication Dose Route Frequency Provider Last Rate Last Admin   ondansetron (ZOFRAN) tablet 8 mg  8 mg Oral Once Guse, Jacquelynn Cree, FNP       Facility-Administered Medications Ordered in Other Visits  Medication Dose Route Frequency Provider Last Rate Last Admin   azaCITIDine (VIDAZA) chemo injection 87.5 mg  50 mg/m2 (Treatment Plan Recorded) Subcutaneous Once  Lloyd Huger, MD       potassium chloride (KLOR-CON) CR tablet 40 mEq  40 mEq Oral BID Lloyd Huger, MD   40 mEq at 07/08/20 1136    OBJECTIVE: There were no vitals filed for this visit.    Body mass index is 22.14 kg/m.    ECOG FS:0 - Asymptomatic  Physical Exam Constitutional:      Appearance: Normal appearance.  HENT:     Head: Normocephalic and atraumatic.  Eyes:     Pupils: Pupils are equal, round, and reactive to light.  Cardiovascular:     Rate and Rhythm: Normal rate and regular rhythm.     Heart sounds: Normal heart sounds. No murmur heard. Pulmonary:     Effort: Pulmonary effort is normal.     Breath sounds: Normal breath sounds. No wheezing.  Abdominal:     General: Bowel sounds are normal. There is no distension.     Palpations: Abdomen is soft.     Tenderness: There is no abdominal tenderness.  Musculoskeletal:        General: Normal range of motion.     Cervical back: Normal range of motion.  Skin:    General: Skin is warm and dry.     Findings: No rash.  Neurological:     Mental Status: She is alert and oriented to person, place, and time.  Psychiatric:        Judgment: Judgment normal.     LAB RESULTS:  Lab Results  Component Value Date   NA 141 11/17/2021   K 3.2 (L) 11/17/2021   CL 113 (H) 11/17/2021   CO2 24 11/17/2021   GLUCOSE 119 (H) 11/17/2021   BUN 17 11/17/2021   CREATININE 1.05 (H) 11/17/2021   CALCIUM 8.9 11/17/2021   PROT 6.6 11/17/2021   ALBUMIN 3.7 11/17/2021   AST 16 11/17/2021   ALT 26 11/17/2021   ALKPHOS 103 11/17/2021   BILITOT 0.8 11/17/2021   GFRNONAA 56 (L) 11/17/2021   GFRAA 54 (  L) 10/26/2019    Lab Results  Component Value Date   WBC 1.7 (L) 11/17/2021   NEUTROABS 1.0 (L) 11/17/2021   HGB 12.7 11/17/2021   HCT 36.8 11/17/2021   MCV 88.2 11/17/2021   PLT 183 11/17/2021     STUDIES: No results found.   ASSESSMENT: AML.  PLAN:    AML: Confirmed by bone marrow biopsy.   Completed cycle 1  of induction therapy at [facility] with Vidaza and venetoclax.  Only received 5 days of Vidaza for cycle 2 due to diarrhea and perirectal abscess.  Most recent bone marrow from 07/09/2021 revealed an increased blast count of 5% suggesting progression.  Plan is to continue maintenance treatment with Vidaza on days 1 through 5 every 28 days along with venetoclax 14 days on and 14 days off.  Reviewed labs with patient which are acceptable for treatment.  Return to clinic in 4 weeks for next cycle of Vidaza and venetoclax.  Neutropenia: Secondary to treatment, monitor.  Proceed as above.  ANC 1.  Neutropenic precautions discussed  Diarrhea: Patient states her diarrhea is intermittent.  Continue Lomotil as needed.    Hypokalemia- Continue oral potassium supplements.  History of breast cancer- Underwent lumpectomy in September 2009.  Underwent Oncotype DX which reported intermediate risk of recurrence.  She completed 5 years of hormonal therapy in approximately June 2015.  Currently no evidence of disease.  Most recent mammogram was reported as BI-RADS 2.  Repeat in March 2024.  History of colon cancer - Received chemotherapy but unsure of what kind.   Fatigue/excessive sleepiness-secondary to venetoclax.  Patient is already on reduced dose given she is doing 2 weeks on 2 weeks off.  Patient would like to discuss when she sees Dr. [name] next cycle.  Disposition- Proceed with Vidaza D1-5 this week and venetoclax 2 weeks on 2 weeks off.  Return to clinic in 4 weeks for next cycle, lab work and C-Phen on day 2.   I spent 25 minutes dedicated to the care of this patient (face-to-face and non-face-to-face) on the date of the encounter to include what is described in the assessment and plan.  Patient expressed understanding and was in agreement with this plan. She also understands that She can call clinic at any time with any questions, concerns, or complaints.    Cancer Staging  History of breast  cancer Staging form: Breast, AJCC 7th Edition - Clinical stage from 01/07/2016: Stage IA (T1c, N0, M0) - Signed by Lloyd Huger, MD on 01/07/2016 Laterality: Right Estrogen receptor status: Positive Progesterone receptor status: Positive HER2 status: Negative   Jacquelin Hawking, NP   11/18/2021 1:41 PM

## 2021-11-19 ENCOUNTER — Other Ambulatory Visit: Payer: PPO

## 2021-11-19 ENCOUNTER — Inpatient Hospital Stay: Payer: PPO

## 2021-11-19 VITALS — BP 118/62 | HR 71 | Temp 97.1°F | Resp 20

## 2021-11-19 DIAGNOSIS — C9200 Acute myeloblastic leukemia, not having achieved remission: Secondary | ICD-10-CM

## 2021-11-19 DIAGNOSIS — Z5111 Encounter for antineoplastic chemotherapy: Secondary | ICD-10-CM | POA: Diagnosis not present

## 2021-11-19 MED ORDER — AZACITIDINE CHEMO SQ INJECTION
50.0000 mg/m2 | Freq: Once | INTRAMUSCULAR | Status: AC
  Administered 2021-11-19: 87.5 mg via SUBCUTANEOUS
  Filled 2021-11-19: qty 3.5

## 2021-11-19 MED ORDER — ONDANSETRON HCL 4 MG PO TABS
8.0000 mg | ORAL_TABLET | Freq: Once | ORAL | Status: AC
  Administered 2021-11-19: 8 mg via ORAL
  Filled 2021-11-19: qty 2

## 2021-11-19 NOTE — Patient Instructions (Signed)
[facility] [place]  Discharge Instructions: Thank you for choosing [facility] to provide your oncology and hematology care.  If you have a lab appointment with the Cancer Center, please go directly to the Cancer Center and check in at the registration area.  Wear comfortable clothing and clothing appropriate for easy access to any Portacath or PICC line.   We strive to give you quality time with your provider. You may need to reschedule your appointment if you arrive late (15 or more minutes).  Arriving late affects you and other patients whose appointments are after yours.  Also, if you miss three or more appointments without notifying the office, you may be dismissed from the clinic at the provider's discretion.      For prescription refill requests, have your pharmacy contact our office and allow 72 hours for refills to be completed.    Today you received the following chemotherapy and/or immunotherapy agents- vidaza      To help prevent nausea and vomiting after your treatment, we encourage you to take your nausea medication as directed.  BELOW ARE SYMPTOMS THAT SHOULD BE REPORTED IMMEDIATELY: *FEVER GREATER THAN 100.4 F (38 C) OR HIGHER *CHILLS OR SWEATING *NAUSEA AND VOMITING THAT IS NOT CONTROLLED WITH YOUR NAUSEA MEDICATION *UNUSUAL SHORTNESS OF BREATH *UNUSUAL BRUISING OR BLEEDING *URINARY PROBLEMS (pain or burning when urinating, or frequent urination) *BOWEL PROBLEMS (unusual diarrhea, constipation, pain near the anus) TENDERNESS IN MOUTH AND THROAT WITH OR WITHOUT PRESENCE OF ULCERS (sore throat, sores in mouth, or a toothache) UNUSUAL RASH, SWELLING OR PAIN  UNUSUAL VAGINAL DISCHARGE OR ITCHING   Items with * indicate a potential emergency and should be followed up as soon as possible or go to the Emergency Department if any problems should occur.  Please show the CHEMOTHERAPY ALERT CARD or IMMUNOTHERAPY ALERT CARD at check-in to the  Emergency Department and triage nurse.  Should you have questions after your visit or need to cancel or reschedule your appointment, please contact [facility]  [phone] and follow the prompts.  Office hours are 8:00 a.m. to 4:30 p.m. Monday - Friday. Please note that voicemails left after 4:00 p.m. may not be returned until the following business day.  We are closed weekends and major holidays. You have access to a nurse at all times for urgent questions. Please call the main number to the clinic [phone] and follow the prompts.  For any non-urgent questions, you may also contact your provider using MyChart. We now offer e-Visits for anyone 18 and older to request care online for non-urgent symptoms. For details visit [email or web address].   Also download the MyChart app! Go to the app store, search "MyChart", open the app, select [place], and log in with your MyChart username and password.  Masks are optional in the cancer centers. If you would like for your care team to wear a mask while they are taking care of you, please let them know. For doctor visits, patients may have with them one support person who is at least 72 years old. At this time, visitors are not allowed in the infusion area.   

## 2021-11-20 ENCOUNTER — Inpatient Hospital Stay: Payer: PPO

## 2021-11-20 ENCOUNTER — Other Ambulatory Visit: Payer: PPO

## 2021-11-20 VITALS — BP 138/65 | HR 89 | Temp 98.0°F | Resp 18

## 2021-11-20 DIAGNOSIS — E860 Dehydration: Secondary | ICD-10-CM

## 2021-11-20 DIAGNOSIS — Z5111 Encounter for antineoplastic chemotherapy: Secondary | ICD-10-CM | POA: Diagnosis not present

## 2021-11-20 DIAGNOSIS — C9200 Acute myeloblastic leukemia, not having achieved remission: Secondary | ICD-10-CM

## 2021-11-20 MED ORDER — SODIUM CHLORIDE 0.9 % IV SOLN
Freq: Once | INTRAVENOUS | Status: AC
  Filled 2021-11-20: qty 250

## 2021-11-20 MED ORDER — SODIUM CHLORIDE 0.9 % IV SOLN: 8.0000 mg | Freq: Once | INTRAVENOUS | Status: DC

## 2021-11-20 MED ORDER — AZACITIDINE CHEMO SQ INJECTION
50.0000 mg/m2 | Freq: Once | INTRAMUSCULAR | Status: AC
  Administered 2021-11-20: 87.5 mg via SUBCUTANEOUS
  Filled 2021-11-20: qty 3.5

## 2021-11-20 MED ORDER — ONDANSETRON HCL 4 MG/2ML IJ SOLN
8.0000 mg | Freq: Once | INTRAMUSCULAR | Status: AC
  Administered 2021-11-20: 8 mg via INTRAVENOUS
  Filled 2021-11-20: qty 4

## 2021-11-20 MED ORDER — ONDANSETRON HCL 4 MG PO TABS: 8.0000 mg | ORAL_TABLET | Freq: Once | ORAL | Status: DC

## 2021-11-20 NOTE — Progress Notes (Signed)
Pt reports increased Nausea with decreased appetite and diarrhea, Per Argentina Donovan RN per Faythe Casa NP pt to receive 500cc NS over 1 hour and '8mg'$  IV Zofran (hold PO from treatment plan) and okay to proceed with Vidaza.

## 2021-11-20 NOTE — Patient Instructions (Signed)
[facility] [place]  Discharge Instructions: Thank you for choosing [facility] to provide your oncology and hematology care.  If you have a lab appointment with the Cancer Center, please go directly to the Cancer Center and check in at the registration area.  Wear comfortable clothing and clothing appropriate for easy access to any Portacath or PICC line.   We strive to give you quality time with your provider. You may need to reschedule your appointment if you arrive late (15 or more minutes).  Arriving late affects you and other patients whose appointments are after yours.  Also, if you miss three or more appointments without notifying the office, you may be dismissed from the clinic at the provider's discretion.      For prescription refill requests, have your pharmacy contact our office and allow 72 hours for refills to be completed.    Today you received the following chemotherapy and/or immunotherapy agents Vidaza      To help prevent nausea and vomiting after your treatment, we encourage you to take your nausea medication as directed.  BELOW ARE SYMPTOMS THAT SHOULD BE REPORTED IMMEDIATELY: *FEVER GREATER THAN 100.4 F (38 C) OR HIGHER *CHILLS OR SWEATING *NAUSEA AND VOMITING THAT IS NOT CONTROLLED WITH YOUR NAUSEA MEDICATION *UNUSUAL SHORTNESS OF BREATH *UNUSUAL BRUISING OR BLEEDING *URINARY PROBLEMS (pain or burning when urinating, or frequent urination) *BOWEL PROBLEMS (unusual diarrhea, constipation, pain near the anus) TENDERNESS IN MOUTH AND THROAT WITH OR WITHOUT PRESENCE OF ULCERS (sore throat, sores in mouth, or a toothache) UNUSUAL RASH, SWELLING OR PAIN  UNUSUAL VAGINAL DISCHARGE OR ITCHING   Items with * indicate a potential emergency and should be followed up as soon as possible or go to the Emergency Department if any problems should occur.  Please show the CHEMOTHERAPY ALERT CARD or IMMUNOTHERAPY ALERT CARD at check-in to the  Emergency Department and triage nurse.  Should you have questions after your visit or need to cancel or reschedule your appointment, please contact [facility] [place]  [phone] and follow the prompts.  Office hours are 8:00 a.m. to 4:30 p.m. Monday - Friday. Please note that voicemails left after 4:00 p.m. may not be returned until the following business day.  We are closed weekends and major holidays. You have access to a nurse at all times for urgent questions. Please call the main number to the clinic [phone] and follow the prompts.  For any non-urgent questions, you may also contact your provider using MyChart. We now offer e-Visits for anyone 18 and older to request care online for non-urgent symptoms. For details visit [email or web address].   Also download the MyChart app! Go to the app store, search "MyChart", open the app, select [place], and log in with your MyChart username and password.  Masks are optional in the cancer centers. If you would like for your care team to wear a mask while they are taking care of you, please let them know. For doctor visits, patients may have with them one support person who is at least 72 years old. At this time, visitors are not allowed in the infusion area.   

## 2021-11-21 ENCOUNTER — Other Ambulatory Visit: Payer: PPO

## 2021-11-21 ENCOUNTER — Inpatient Hospital Stay: Payer: PPO

## 2021-11-21 VITALS — BP 134/70 | HR 92 | Temp 98.8°F | Resp 18

## 2021-11-21 DIAGNOSIS — Z5111 Encounter for antineoplastic chemotherapy: Secondary | ICD-10-CM | POA: Diagnosis not present

## 2021-11-21 DIAGNOSIS — C9200 Acute myeloblastic leukemia, not having achieved remission: Secondary | ICD-10-CM

## 2021-11-21 MED ORDER — AZACITIDINE CHEMO SQ INJECTION
50.0000 mg/m2 | Freq: Once | INTRAMUSCULAR | Status: AC
  Administered 2021-11-21: 87.5 mg via SUBCUTANEOUS
  Filled 2021-11-21: qty 3.5

## 2021-11-21 MED ORDER — ONDANSETRON HCL 4 MG PO TABS
8.0000 mg | ORAL_TABLET | Freq: Once | ORAL | Status: AC
  Administered 2021-11-21: 8 mg via ORAL
  Filled 2021-11-21: qty 2

## 2021-11-21 NOTE — Patient Instructions (Signed)
[facility] [place]  Discharge Instructions: Thank you for choosing [facility] to provide your oncology and hematology care.  If you have a lab appointment with the Cancer Center, please go directly to the Cancer Center and check in at the registration area.  Wear comfortable clothing and clothing appropriate for easy access to any Portacath or PICC line.   We strive to give you quality time with your provider. You may need to reschedule your appointment if you arrive late (15 or more minutes).  Arriving late affects you and other patients whose appointments are after yours.  Also, if you miss three or more appointments without notifying the office, you may be dismissed from the clinic at the provider's discretion.      For prescription refill requests, have your pharmacy contact our office and allow 72 hours for refills to be completed.    Today you received the following chemotherapy and/or immunotherapy agents Vidaza      To help prevent nausea and vomiting after your treatment, we encourage you to take your nausea medication as directed.  BELOW ARE SYMPTOMS THAT SHOULD BE REPORTED IMMEDIATELY: *FEVER GREATER THAN 100.4 F (38 C) OR HIGHER *CHILLS OR SWEATING *NAUSEA AND VOMITING THAT IS NOT CONTROLLED WITH YOUR NAUSEA MEDICATION *UNUSUAL SHORTNESS OF BREATH *UNUSUAL BRUISING OR BLEEDING *URINARY PROBLEMS (pain or burning when urinating, or frequent urination) *BOWEL PROBLEMS (unusual diarrhea, constipation, pain near the anus) TENDERNESS IN MOUTH AND THROAT WITH OR WITHOUT PRESENCE OF ULCERS (sore throat, sores in mouth, or a toothache) UNUSUAL RASH, SWELLING OR PAIN  UNUSUAL VAGINAL DISCHARGE OR ITCHING   Items with * indicate a potential emergency and should be followed up as soon as possible or go to the Emergency Department if any problems should occur.  Please show the CHEMOTHERAPY ALERT CARD or IMMUNOTHERAPY ALERT CARD at check-in to the  Emergency Department and triage nurse.  Should you have questions after your visit or need to cancel or reschedule your appointment, please contact [facility] [place]  [phone] and follow the prompts.  Office hours are 8:00 a.m. to 4:30 p.m. Monday - Friday. Please note that voicemails left after 4:00 p.m. may not be returned until the following business day.  We are closed weekends and major holidays. You have access to a nurse at all times for urgent questions. Please call the main number to the clinic [phone] and follow the prompts.  For any non-urgent questions, you may also contact your provider using MyChart. We now offer e-Visits for anyone 18 and older to request care online for non-urgent symptoms. For details visit [email or web address].   Also download the MyChart app! Go to the app store, search "MyChart", open the app, select [place], and log in with your MyChart username and password.  Masks are optional in the cancer centers. If you would like for your care team to wear a mask while they are taking care of you, please let them know. For doctor visits, patients may have with them one support person who is at least 72 years old. At this time, visitors are not allowed in the infusion area.   

## 2021-11-27 ENCOUNTER — Other Ambulatory Visit: Payer: Self-pay

## 2021-11-27 ENCOUNTER — Telehealth: Payer: Self-pay | Admitting: *Deleted

## 2021-11-27 ENCOUNTER — Inpatient Hospital Stay: Payer: PPO

## 2021-11-27 DIAGNOSIS — Z5111 Encounter for antineoplastic chemotherapy: Secondary | ICD-10-CM | POA: Diagnosis not present

## 2021-11-27 DIAGNOSIS — R32 Unspecified urinary incontinence: Secondary | ICD-10-CM

## 2021-11-27 DIAGNOSIS — R300 Dysuria: Secondary | ICD-10-CM

## 2021-11-27 LAB — URINALYSIS, COMPLETE (UACMP) WITH MICROSCOPIC
Bilirubin Urine: NEGATIVE
Glucose, UA: NEGATIVE mg/dL
Ketones, ur: NEGATIVE mg/dL
Nitrite: NEGATIVE
Protein, ur: 100 mg/dL — AB
RBC / HPF: >50 RBC/hpf — ABNORMAL HIGH (ref 0–5)
Specific Gravity, Urine: 1.019 (ref 1.005–1.030)
WBC, UA: >50 WBC/hpf — ABNORMAL HIGH (ref 0–5)
pH: 5 (ref 5.0–8.0)

## 2021-11-27 NOTE — Telephone Encounter (Signed)
Stating that she is having urinary symptoms that started yesterday afternoon.  She is having extreme pain in bladder area when she stands from urinating and burns terribly when she urinates and she is having incontinence. Denies fever Urine is not discolored, no foul odor. She is asking if we can see her or does she need to go to Urgent Care. She wants something done today due to the amount of pain she is having

## 2021-11-27 NOTE — Telephone Encounter (Signed)
Per the clinical team, pt needs a ua/uc. Pt contacted and agrees to come today to provide urine sample.

## 2021-11-28 ENCOUNTER — Inpatient Hospital Stay (HOSPITAL_BASED_OUTPATIENT_CLINIC_OR_DEPARTMENT_OTHER): Payer: PPO | Admitting: Hospice and Palliative Medicine

## 2021-11-28 DIAGNOSIS — N390 Urinary tract infection, site not specified: Secondary | ICD-10-CM

## 2021-11-28 MED ORDER — FOSFOMYCIN TROMETHAMINE 3 G PO PACK: 3.0000 g | PACK | Freq: Once | ORAL | 0 refills | Status: AC

## 2021-11-28 NOTE — Progress Notes (Signed)
Virtual Visit via Telephone Note  I connected with Debra Hale on 11/28/21 at 10:00 AM EDT by telephone and verified that I am speaking with the correct person using two identifiers.  Location: Patient: Home Provider: Clinic   I discussed the limitations, risks, security and privacy concerns of performing an evaluation and management service by telephone and the availability of in person appointments. I also discussed with the patient that there may be a patient responsible charge related to this service. The patient expressed understanding and agreed to proceed.   History of Present Illness: Debra Hale is a 71 y.o. 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     Observations/Objective: Patient was an add-on to my clinic schedule this morning due to UTI symptoms.  I called and spoke with patient by phone.  Patient reports that she has dysuria, urinary frequency/urgency, and some urinary incontinence.  Symptoms started yesterday.  She denies fever or chills.  No suprapubic or CVA tenderness.  No hematuria.  No other symptomatic complaints or concerns.  Patient reports good appetite.  Patient was able to provide urinalysis.  Assessment and Plan: UTI -urinalysis does appear consistent with UTI.  Urine culture and sensitivities pending.  Patient has multiple drug allergies, although it is not clear if these are legitimate allergies versus intolerances.  Discussed with pharmacist who recommended fosfomycin based on her allergy list.  Start fosfomycin 3 g x 1.  ED precautions reviewed with patient.  Follow Up Instructions: As previously scheduled   I discussed the assessment and treatment plan with the patient. The patient was provided an opportunity to ask questions and all were answered. The patient agreed with the plan and demonstrated an understanding of the instructions.   The patient was advised to call back or seek an  in-person evaluation if the symptoms worsen or if the condition fails to improve as anticipated.  I provided 10 minutes of non-face-to-face time during this encounter.   Irean Hong, NP

## 2021-11-29 LAB — URINE CULTURE: Culture: 40000 — AB

## 2021-12-01 ENCOUNTER — Other Ambulatory Visit: Payer: Self-pay

## 2021-12-01 NOTE — Patient Outreach (Signed)
  Care Coordination   Initial Visit Note   12/01/2021 Name: KRYSTAN NORTHROP MRN: [id number] DOB: Dec 30, 1950  SHATEKA PETREA is a 71 y.o. year old female who sees [name], MD for primary care. I spoke with  [name] by phone today  What matters to the patients health and wellness today?  Patient states she does not need care coordination services at this time.  Patient states she was treated for a urinary tract infection over the weekend and she had to pay $89 for the antibiotic prescription Fosfamycin even with provider approval.  She inquired if she can get any additional reimbursement on her prescription.     Goals Addressed             This Visit's Progress    Discount for recently prescribed antibiotic prescription       Care Coordination Interventions: Evaluation of current treatment plan related to urinary tract infection and patient's adherence to plan as established by provider Advised patient to call her [place] to inquire if she can get any reimbursement for her prescribed antibiotic since provider authorized. Reviewed medications with patient and discussed importance of completing her prescription even if feeling better from urinary tract infection Reviewed scheduled/upcoming provider appointments I Advised to contact her provider if she continues to have ongoing urinary tract infection symptoms.            SDOH assessments and interventions completed:   No   Care Coordination Interventions Activated:  Yes Care Coordination Interventions:  Yes, provided  Follow up plan: No further intervention required.  Encounter Outcome:  Pt. Refused  Quinn Plowman RN,BSN,CCM RN Care Manager Coordinator [place]  [phone]

## 2021-12-01 NOTE — Patient Instructions (Signed)
Visit Information  Thank you for taking time to visit with me today. Please don't hesitate to contact me if I can be of assistance to you.   Following are the goals we discussed today:   Goals Addressed             This Visit's Progress    Discount for recently prescribed antibiotic prescription       Care Coordination Interventions: Evaluation of current treatment plan related to urinary tract infection and patient's adherence to plan as established by provider Advised patient to call her [place] to inquire if she can get any reimbursement for her prescribed antibiotic since provider authorized. Reviewed medications with patient and discussed importance of completing her prescription even if feeling better from urinary tract infection Reviewed scheduled/upcoming provider appointments I Advised to contact her provider if she continues to have ongoing urinary tract infection symptoms.             Please call the care guide team at [phone] if you need to cancel or reschedule your appointment.   If you are experiencing a Mental Health or [place] or need someone to talk to, please call the Suicide and Crisis Lifeline: 988 call 1-800-273-TALK (toll free, 24 hour hotline)  Patient verbalizes understanding of instructions and care plan provided today and agrees to view in [place]. Active MyChart status and patient understanding of how to access instructions and care plan via MyChart confirmed with patient.     No further follow up required  Quinn Plowman RN,BSN,CCM RN Care Manager Coordinator [place]  [phone]

## 2021-12-08 ENCOUNTER — Other Ambulatory Visit (HOSPITAL_COMMUNITY): Payer: Self-pay

## 2021-12-10 ENCOUNTER — Other Ambulatory Visit (HOSPITAL_COMMUNITY): Payer: Self-pay

## 2021-12-12 ENCOUNTER — Other Ambulatory Visit (HOSPITAL_COMMUNITY): Payer: Self-pay

## 2021-12-13 NOTE — Progress Notes (Unsigned)
[facility]  Telephone:([phone]) [phone] Fax:(336) [phone]  ID: Debra Hale OB: 01-Dec-1950  MR#: [id number]  FUX#:[id number]  Patient Care Team: [name], MD as PCP - General (Internal Medicine) Wellington Hampshire, MD as PCP - Cardiology (Cardiology)  CHIEF COMPLAINT: AML.  INTERVAL HISTORY: Patient returns to clinic today for further evaluation and continuation of cycle 16 of Vidaza and venetoclax.  She continues to have chronic weakness and fatigue and intermittent diarrhea.  She also complains of a headache after her first day of treatment which is improved with Tylenol.  She otherwise feels well.  She continues to be highly anxious.  She denies any pain.  She has no neurologic complaints.  She denies any recent fevers or illnesses. She has no chest pain, shortness of breath, cough, or hemoptysis.  She denies any nausea, vomiting, or constipation.  She has no urinary complaints.  Patient offers no further specific complaints today.  REVIEW OF SYSTEMS:   Review of Systems  Constitutional:  Positive for malaise/fatigue. Negative for fever and weight loss.  Respiratory: Negative.  Negative for cough, hemoptysis and shortness of breath.   Cardiovascular: Negative.  Negative for chest pain and leg swelling.  Gastrointestinal:  Positive for diarrhea. Negative for abdominal pain.  Genitourinary: Negative.  Negative for dysuria.  Musculoskeletal: Negative.  Negative for back pain and falls.  Skin: Negative.  Negative for rash.  Neurological:  Positive for weakness. Negative for dizziness, focal weakness and headaches.  Psychiatric/Behavioral:  Negative for memory loss. The patient is nervous/anxious.     As per HPI. Otherwise, a complete review of systems is negative.  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   s/p surgery and XRT   Osteoporosis    Other and unspecified hyperlipidemia    Palpitations    Personal history of chemotherapy    Personal history of malignant neoplasm of large intestine    carcinoma - cecum, s/p right laparoscopic colectomy - s/p chemotherapy and XRT   Personal history of radiation therapy    Pneumonia 2019   PONV (postoperative nausea and vomiting)    Pure hypercholesterolemia    Unspecified hereditary and idiopathic peripheral neuropathy     PAST SURGICAL HISTORY: Past Surgical History:  Procedure Laterality Date   APPENDECTOMY  1985   BREAST BIOPSY Right 2009   positive   BREAST BIOPSY Right 2009   negative   BREAST LUMPECTOMY Right 2009   breast cancer   CHOLECYSTECTOMY  1995   COLONOSCOPY     COLONOSCOPY WITH PROPOFOL N/A 10/29/2017   Procedure: COLONOSCOPY WITH PROPOFOL;  Surgeon: Manya Silvas, MD;  Location[facility];  Service: Endoscopy;  Laterality: N/A;   DILATION AND CURETTAGE OF UTERUS  1990   DILATION AND CURETTAGE, DIAGNOSTIC / THERAPEUTIC  1990   ESOPHAGOGASTRODUODENOSCOPY     ESOPHAGOGASTRODUODENOSCOPY (EGD) WITH PROPOFOL N/A 10/29/2017   Procedure: ESOPHAGOGASTRODUODENOSCOPY (EGD) WITH PROPOFOL;  Surgeon: Manya Silvas, MD;  Location: [facility] ENDOSCOPY;  Service: Endoscopy;  Laterality: N/A;   INCISION AND DRAINAGE PERIRECTAL ABSCESS N/A 06/17/2020   Procedure: IRRIGATION AND DEBRIDEMENT PERIRECTAL ABSCESS;  Surgeon: Jules Husbands, MD;  Location: [facility] ORS;  Service: General;  Laterality: N/A;   IR BONE MARROW BIOPSY & ASPIRATION  07/09/2021   LAPAROSCOPIC PARTIAL COLECTOMY     stage 3-C carcinoma of the cecum, s/p chemotherapy  and xrt   LITHOTRIPSY     SIGMOIDOSCOPY  08/26/1993   THYROID LOBECTOMY  2002   s/p XRT   TOTAL HIP ARTHROPLASTY Right 10/24/2019   Procedure: TOTAL HIP ARTHROPLASTY;  Surgeon: Corky Mull, MD;  Location: [facility] ORS;  Service: Orthopedics;  Laterality: Right;    FAMILY HISTORY: Family History  Problem Relation Age of Onset   Stroke Mother        95s   Alzheimer's disease Mother    Lung cancer Father    Prostate cancer Father    Cancer Father        Colon   Colon cancer Father    Breast cancer Sister        32's   Lung cancer Sister    Breast cancer Maternal Aunt     ADVANCED DIRECTIVES (Y/N):  N  HEALTH MAINTENANCE: Social History   Tobacco Use   Smoking status: Never   Smokeless tobacco: Never  Vaping Use   Vaping Use: Never used  Substance Use Topics   Alcohol use: No    Alcohol/week: 0.0 standard drinks of alcohol   Drug use: No     Colonoscopy:  PAP:  Bone density:  Lipid panel:  Allergies  Allergen Reactions   Demeclocycline Other (See Comments)    Throat swells   Sulfa Antibiotics Other (See Comments)    Other reaction(s): Other (See Comments) Throat swells Other reaction(s): Other (See Comments) Throat swells Throat swells Other reaction(s): Other (See Comments) Throat swells Other reaction(s): Unknown Other reaction(s): Other (See Comments) Throat swells Other reaction(s): Other (See Comments) Other reaction(s): Other (See Comments) Throat swellsOther reaction(s): Other (See Comments) Throat swells   Tetracyclines & Related Other (See Comments)    Throat swells    Augmentin [Amoxicillin-Pot Clavulanate] Diarrhea   Bentyl [Dicyclomine Hcl]     unkn   Ciprofloxacin Diarrhea   Codeine Other (See Comments)    dizziness     Dicyclomine Hcl Other (See Comments)    unkn   Epinephrine     Speeds heart up - panic    Flagyl [Metronidazole] Nausea And Vomiting   Librax [Chlordiazepoxide-Clidinium]     unkn   Novocain [Procaine] Other  (See Comments)    "Shaky"   Phenobarbital     unkn   Prednisone     Nervous     Ultram [Tramadol] Other (See Comments)    Sick feeling    Current Outpatient Medications  Medication Sig Dispense Refill   Azelastine-Fluticasone 137-50 MCG/ACT SUSP SPRAY 2 SPRAYS INTO EACH NOSTRIL EVERY DAY 23 g 3   clonazePAM (KLONOPIN) 0.5 MG tablet Take 1.5 mg by mouth 2 (two) times daily.   0   clotrimazole (LOTRIMIN) 1 % cream Apply 1 application. topically 2 (two) times daily. 30 g 0   diphenoxylate-atropine (LOMOTIL) 2.5-0.025 MG tablet Take 1 tablet by mouth 4 (four) times daily as needed for diarrhea or loose stools. 60 tablet 1   escitalopram (LEXAPRO) 20 MG tablet Take 20 mg by mouth every morning.  levothyroxine (SYNTHROID) 125 MCG tablet TAKE 1 TABLET EVERY DAY ON EMPTY STOMACHWITH A GLASS OF WATER AT LEAST 30-60 MINBEFORE BREAKFAST 90 tablet 1   loperamide (IMODIUM) 2 MG capsule Take by mouth.     metoprolol tartrate (LOPRESSOR) 25 MG tablet TAKE 1 TABLET (25 MG TOTAL) BY MOUTH AS NEEDED (AS NEEDED UP TO TWICE DAILY FOR PALPITATIONS). 60 tablet 1   ondansetron (ZOFRAN) 4 MG tablet Take 4-8 mg by mouth every 8 (eight) hours as needed.     ondansetron (ZOFRAN) 8 MG tablet Take 1 tablet (8 mg total) by mouth 2 (two) times daily as needed for nausea or vomiting. 20 tablet 0   pantoprazole (PROTONIX) 40 MG tablet TAKE ONE TABLET (40 MG) BY MOUTH EVERY DAY 90 tablet 2   potassium chloride SA (KLOR-CON M) 20 MEQ tablet TAKE ONE (1) TABLET BY MOUTH TWO TIMES PER DAY 60 tablet 1   valACYclovir (VALTREX) 500 MG tablet Take 1 tablet (500 mg total) by mouth daily. 90 tablet 3   venetoclax (VENCLEXTA) 100 MG tablet Take 4 tablets (400 mg total) by mouth daily. Take for 14 days, then hold for 14 days. Repeat every 28 days. Take with a meal and a full glass of water. 56 tablet 1   Current Facility-Administered Medications  Medication Dose Route Frequency Provider Last Rate Last Admin   ondansetron  (ZOFRAN) tablet 8 mg  8 mg Oral Once Guse, Jacquelynn Cree, FNP       Facility-Administered Medications Ordered in Other Visits  Medication Dose Route Frequency Provider Last Rate Last Admin   potassium chloride (KLOR-CON) CR tablet 40 mEq  40 mEq Oral BID Lloyd Huger, MD   40 mEq at 07/08/20 1136    OBJECTIVE: Vitals:   12/16/21 1323  BP: 114/70  Pulse: 80  Resp: 18  Temp: 98 F (36.7 C)  SpO2: 98%     Body mass index is 22.31 kg/m.    ECOG FS:0 - Asymptomatic  General: Well-developed, well-nourished, no acute distress. Eyes: Pink conjunctiva, anicteric sclera. HEENT: Normocephalic, moist mucous membranes. Lungs: No audible wheezing or coughing. Heart: Regular rate and rhythm. Abdomen: Soft, nontender, no obvious distention. Musculoskeletal: No edema, cyanosis, or clubbing. Neuro: Alert, answering all questions appropriately. Cranial nerves grossly intact. Skin: No rashes or petechiae noted. Psych: Normal affect.  LAB RESULTS:  Lab Results  Component Value Date   NA 139 12/15/2021   K 3.1 (L) 12/15/2021   CL 103 12/15/2021   CO2 27 12/15/2021   GLUCOSE 101 (H) 12/15/2021   BUN 18 12/15/2021   CREATININE 0.87 12/15/2021   CALCIUM 8.7 (L) 12/15/2021   PROT 6.4 (L) 12/15/2021   ALBUMIN 3.6 12/15/2021   AST 15 12/15/2021   ALT 27 12/15/2021   ALKPHOS 101 12/15/2021   BILITOT 0.6 12/15/2021   GFRNONAA >60 12/15/2021   GFRAA 54 (L) 10/26/2019    Lab Results  Component Value Date   WBC 1.9 (L) 12/15/2021   NEUTROABS 1.2 (L) 12/15/2021   HGB 12.3 12/15/2021   HCT 35.5 (L) 12/15/2021   MCV 87.7 12/15/2021   PLT 158 12/15/2021     STUDIES: No results found.   ASSESSMENT: AML.  PLAN:    1.  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  Patient completed cycle 1 of induction therapy at [facility] with Vidaza  and  venetoclax.  She only received 5 days of Vidaza for cycle 2 recently which was interrupted secondary to significant diarrhea and a perirectal abscess.  Her most recent bone marrow biopsy on July 09, 2021 revealed an increased blast count to 5% suggesting slight progression of disease.  Case discussed with Dr. Marvel Plan at [facility].  Given her IDH-1 mutation, she may also benefit from an IDH inhibitor in the future.  Will continue maintenance treatment with Vidaza on days 1 through 5 every 28 days along with venetoclax 14 days on, 14 days off.  Patient reports she has only been taking 2 pills/day rather than the recommended 4.  Appreciate clinical pharmacy input.  Return to clinic in 4 weeks for further evaluation and continuation of treatment.   2.  Neutropenia: Chronic and unchanged.  Proceed with treatment as above.   3.  Thrombocytopenia: Resolved. 4.  Perirectal abscess: Resolved.   5.  Diarrhea: Chronic and unchanged.  Continue Lomotil as needed.   6.  Supportive care: Patient previously was given referrals to home health and home palliative care.  Unclear if these services are still active.  Appreciate palliative care input.   7.  Hypokalemia: Chronic and unchanged.  Continue oral potassium supplementation.  8.  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  Patient also received chemotherapy and likely received Adriamycin, but exact regimen is unknown.  She completed 5 years of hormonal therapy in approximately June 2015. Currently, she has no evidence of disease.  Her most recent mammogram on July 29, 2021 was reported as BI-RADS 2.  Repeat in March 2024. 9.  History of colon cancer: Patient also states that she received chemotherapy for this, possibly FOLFOX but again this is unknown.   Patient expressed understanding and  was in agreement with this plan. She also understands that She can call clinic at any time with any questions, concerns, or complaints.    Cancer Staging  History of breast cancer Staging form: Breast, AJCC 7th Edition - Clinical stage from 01/07/2016: Stage IA (T1c, N0, M0) - Signed by Lloyd Huger, MD on 01/07/2016 Laterality: Right Estrogen receptor status: Positive Progesterone receptor status: Positive HER2 status: Negative   Lloyd Huger, MD   12/16/2021 3:19 PM

## 2021-12-15 ENCOUNTER — Inpatient Hospital Stay: Payer: PPO

## 2021-12-15 ENCOUNTER — Inpatient Hospital Stay: Payer: PPO | Attending: Oncology

## 2021-12-15 VITALS — BP 111/73 | HR 81 | Temp 98.9°F | Resp 18 | Wt 131.0 lb

## 2021-12-15 DIAGNOSIS — Z9221 Personal history of antineoplastic chemotherapy: Secondary | ICD-10-CM | POA: Diagnosis not present

## 2021-12-15 DIAGNOSIS — Z881 Allergy status to other antibiotic agents status: Secondary | ICD-10-CM | POA: Insufficient documentation

## 2021-12-15 DIAGNOSIS — C9200 Acute myeloblastic leukemia, not having achieved remission: Secondary | ICD-10-CM

## 2021-12-15 DIAGNOSIS — Z803 Family history of malignant neoplasm of breast: Secondary | ICD-10-CM | POA: Insufficient documentation

## 2021-12-15 DIAGNOSIS — Z885 Allergy status to narcotic agent status: Secondary | ICD-10-CM | POA: Insufficient documentation

## 2021-12-15 DIAGNOSIS — R531 Weakness: Secondary | ICD-10-CM | POA: Diagnosis not present

## 2021-12-15 DIAGNOSIS — Z801 Family history of malignant neoplasm of trachea, bronchus and lung: Secondary | ICD-10-CM | POA: Insufficient documentation

## 2021-12-15 DIAGNOSIS — Z880 Allergy status to penicillin: Secondary | ICD-10-CM | POA: Insufficient documentation

## 2021-12-15 DIAGNOSIS — Z9049 Acquired absence of other specified parts of digestive tract: Secondary | ICD-10-CM | POA: Insufficient documentation

## 2021-12-15 DIAGNOSIS — R519 Headache, unspecified: Secondary | ICD-10-CM | POA: Insufficient documentation

## 2021-12-15 DIAGNOSIS — Z800 Family history of malignant neoplasm of digestive organs: Secondary | ICD-10-CM | POA: Insufficient documentation

## 2021-12-15 DIAGNOSIS — Z85850 Personal history of malignant neoplasm of thyroid: Secondary | ICD-10-CM | POA: Insufficient documentation

## 2021-12-15 DIAGNOSIS — Z923 Personal history of irradiation: Secondary | ICD-10-CM | POA: Insufficient documentation

## 2021-12-15 DIAGNOSIS — R5383 Other fatigue: Secondary | ICD-10-CM | POA: Diagnosis not present

## 2021-12-15 DIAGNOSIS — Z79899 Other long term (current) drug therapy: Secondary | ICD-10-CM | POA: Insufficient documentation

## 2021-12-15 DIAGNOSIS — E039 Hypothyroidism, unspecified: Secondary | ICD-10-CM | POA: Diagnosis not present

## 2021-12-15 DIAGNOSIS — M1990 Unspecified osteoarthritis, unspecified site: Secondary | ICD-10-CM | POA: Diagnosis not present

## 2021-12-15 DIAGNOSIS — Z8042 Family history of malignant neoplasm of prostate: Secondary | ICD-10-CM | POA: Insufficient documentation

## 2021-12-15 DIAGNOSIS — Z7969 Long term (current) use of other immunomodulators and immunosuppressants: Secondary | ICD-10-CM | POA: Diagnosis not present

## 2021-12-15 DIAGNOSIS — E7800 Pure hypercholesterolemia, unspecified: Secondary | ICD-10-CM | POA: Diagnosis not present

## 2021-12-15 DIAGNOSIS — Z882 Allergy status to sulfonamides status: Secondary | ICD-10-CM | POA: Insufficient documentation

## 2021-12-15 DIAGNOSIS — D709 Neutropenia, unspecified: Secondary | ICD-10-CM | POA: Diagnosis not present

## 2021-12-15 DIAGNOSIS — F419 Anxiety disorder, unspecified: Secondary | ICD-10-CM | POA: Diagnosis not present

## 2021-12-15 DIAGNOSIS — E876 Hypokalemia: Secondary | ICD-10-CM | POA: Diagnosis not present

## 2021-12-15 DIAGNOSIS — K219 Gastro-esophageal reflux disease without esophagitis: Secondary | ICD-10-CM | POA: Diagnosis not present

## 2021-12-15 DIAGNOSIS — Z79890 Hormone replacement therapy (postmenopausal): Secondary | ICD-10-CM | POA: Diagnosis not present

## 2021-12-15 DIAGNOSIS — Z818 Family history of other mental and behavioral disorders: Secondary | ICD-10-CM | POA: Insufficient documentation

## 2021-12-15 DIAGNOSIS — Z5111 Encounter for antineoplastic chemotherapy: Secondary | ICD-10-CM | POA: Diagnosis not present

## 2021-12-15 DIAGNOSIS — R197 Diarrhea, unspecified: Secondary | ICD-10-CM | POA: Insufficient documentation

## 2021-12-15 DIAGNOSIS — Z79624 Long term (current) use of inhibitors of nucleotide synthesis: Secondary | ICD-10-CM | POA: Diagnosis not present

## 2021-12-15 DIAGNOSIS — Z853 Personal history of malignant neoplasm of breast: Secondary | ICD-10-CM | POA: Insufficient documentation

## 2021-12-15 DIAGNOSIS — Z87442 Personal history of urinary calculi: Secondary | ICD-10-CM | POA: Diagnosis not present

## 2021-12-15 DIAGNOSIS — Z888 Allergy status to other drugs, medicaments and biological substances status: Secondary | ICD-10-CM | POA: Insufficient documentation

## 2021-12-15 DIAGNOSIS — Z823 Family history of stroke: Secondary | ICD-10-CM | POA: Insufficient documentation

## 2021-12-15 LAB — CBC WITH DIFFERENTIAL/PLATELET
Abs Immature Granulocytes: 0 10*3/uL (ref 0.00–0.07)
Basophils Absolute: 0 10*3/uL (ref 0.0–0.1)
Basophils Relative: 1 %
Eosinophils Absolute: 0 10*3/uL (ref 0.0–0.5)
Eosinophils Relative: 0 %
HCT: 35.5 % — ABNORMAL LOW (ref 36.0–46.0)
Hemoglobin: 12.3 g/dL (ref 12.0–15.0)
Immature Granulocytes: 0 %
Lymphocytes Relative: 29 %
Lymphs Abs: 0.6 10*3/uL — ABNORMAL LOW (ref 0.7–4.0)
MCH: 30.4 pg (ref 26.0–34.0)
MCHC: 34.6 g/dL (ref 30.0–36.0)
MCV: 87.7 fL (ref 80.0–100.0)
Monocytes Absolute: 0.1 10*3/uL (ref 0.1–1.0)
Monocytes Relative: 6 %
Neutro Abs: 1.2 10*3/uL — ABNORMAL LOW (ref 1.7–7.7)
Neutrophils Relative %: 64 %
Platelets: 158 10*3/uL (ref 150–400)
RBC: 4.05 MIL/uL (ref 3.87–5.11)
RDW: 14.3 % (ref 11.5–15.5)
WBC: 1.9 10*3/uL — ABNORMAL LOW (ref 4.0–10.5)
nRBC: 0 % (ref 0.0–0.2)

## 2021-12-15 LAB — COMPREHENSIVE METABOLIC PANEL
ALT: 27 U/L (ref 0–44)
AST: 15 U/L (ref 15–41)
Albumin: 3.6 g/dL (ref 3.5–5.0)
Alkaline Phosphatase: 101 U/L (ref 38–126)
Anion gap: 9 (ref 5–15)
BUN: 18 mg/dL (ref 8–23)
CO2: 27 mmol/L (ref 22–32)
Calcium: 8.7 mg/dL — ABNORMAL LOW (ref 8.9–10.3)
Chloride: 103 mmol/L (ref 98–111)
Creatinine, Ser: 0.87 mg/dL (ref 0.44–1.00)
GFR, Estimated: >60 mL/min (ref >60)
Glucose, Bld: 101 mg/dL — ABNORMAL HIGH (ref 70–99)
Potassium: 3.1 mmol/L — ABNORMAL LOW (ref 3.5–5.1)
Sodium: 139 mmol/L (ref 135–145)
Total Bilirubin: 0.6 mg/dL (ref 0.3–1.2)
Total Protein: 6.4 g/dL — ABNORMAL LOW (ref 6.5–8.1)

## 2021-12-15 MED ORDER — ONDANSETRON HCL 4 MG PO TABS
8.0000 mg | ORAL_TABLET | Freq: Once | ORAL | Status: AC
  Administered 2021-12-15: 8 mg via ORAL

## 2021-12-15 MED ORDER — AZACITIDINE CHEMO SQ INJECTION
50.0000 mg/m2 | Freq: Once | INTRAMUSCULAR | Status: AC
  Administered 2021-12-15: 87.5 mg via SUBCUTANEOUS
  Filled 2021-12-15: qty 3.5

## 2021-12-16 ENCOUNTER — Inpatient Hospital Stay (HOSPITAL_BASED_OUTPATIENT_CLINIC_OR_DEPARTMENT_OTHER): Payer: PPO | Admitting: Oncology

## 2021-12-16 ENCOUNTER — Encounter: Payer: Self-pay | Admitting: Oncology

## 2021-12-16 ENCOUNTER — Other Ambulatory Visit: Payer: PPO

## 2021-12-16 ENCOUNTER — Inpatient Hospital Stay: Payer: PPO

## 2021-12-16 VITALS — BP 114/70 | HR 80 | Temp 98.0°F | Resp 18 | Ht 64.5 in | Wt 132.0 lb

## 2021-12-16 DIAGNOSIS — Z5111 Encounter for antineoplastic chemotherapy: Secondary | ICD-10-CM | POA: Diagnosis not present

## 2021-12-16 DIAGNOSIS — C9200 Acute myeloblastic leukemia, not having achieved remission: Secondary | ICD-10-CM

## 2021-12-16 MED ORDER — AZACITIDINE CHEMO SQ INJECTION
50.0000 mg/m2 | Freq: Once | INTRAMUSCULAR | Status: AC
  Administered 2021-12-16: 87.5 mg via SUBCUTANEOUS
  Filled 2021-12-16: qty 3.5

## 2021-12-16 MED ORDER — ONDANSETRON HCL 4 MG PO TABS
8.0000 mg | ORAL_TABLET | Freq: Once | ORAL | Status: AC
  Administered 2021-12-16: 8 mg via ORAL

## 2021-12-16 NOTE — Progress Notes (Signed)
Patient reports headaches the day of chemo, low appetite, ,mild nausea, chronic diarrhea, and some lingering urinary symptoms (treated by [employer] recently).

## 2021-12-16 NOTE — Patient Instructions (Signed)
[facility]  Discharge Instructions: Thank you for choosing [facility] to provide your oncology and hematology care.   If you have a lab appointment with the Cancer Center, please go directly to the Cancer Center and check in at the registration area.   Wear comfortable clothing and clothing appropriate for easy access to any Portacath or PICC line.   We strive to give you quality time with your provider. You may need to reschedule your appointment if you arrive late (15 or more minutes).  Arriving late affects you and other patients whose appointments are after yours.  Also, if you miss three or more appointments without notifying the office, you may be dismissed from the clinic at the provider's discretion.      For prescription refill requests, have your pharmacy contact our office and allow 72 hours for refills to be completed.    Today you received the following chemotherapy and/or immunotherapy agents       To help prevent nausea and vomiting after your treatment, we encourage you to take your nausea medication as directed.  BELOW ARE SYMPTOMS THAT SHOULD BE REPORTED IMMEDIATELY: *FEVER GREATER THAN 100.4 F (38 C) OR HIGHER *CHILLS OR SWEATING *NAUSEA AND VOMITING THAT IS NOT CONTROLLED WITH YOUR NAUSEA MEDICATION *UNUSUAL SHORTNESS OF BREATH *UNUSUAL BRUISING OR BLEEDING *URINARY PROBLEMS (pain or burning when urinating, or frequent urination) *BOWEL PROBLEMS (unusual diarrhea, constipation, pain near the anus) TENDERNESS IN MOUTH AND THROAT WITH OR WITHOUT PRESENCE OF ULCERS (sore throat, sores in mouth, or a toothache) UNUSUAL RASH, SWELLING OR PAIN  UNUSUAL VAGINAL DISCHARGE OR ITCHING   Items with * indicate a potential emergency and should be followed up as soon as possible or go to the Emergency Department if any problems should occur.  Please show the CHEMOTHERAPY ALERT CARD or IMMUNOTHERAPY ALERT CARD at check-in to the  Emergency Department and triage nurse.  Should you have questions after your visit or need to cancel or reschedule your appointment, please contact [facility]  Dept: [phone]  and follow the prompts.  Office hours are 8:00 a.m. to 4:30 p.m. Monday - Friday. Please note that voicemails left after 4:00 p.m. may not be returned until the following business day.  We are closed weekends and major holidays. You have access to a nurse at all times for urgent questions. Please call the main number to the clinic Dept: [phone] and follow the prompts.   For any non-urgent questions, you may also contact your provider using MyChart. We now offer e-Visits for anyone 18 and older to request care online for non-urgent symptoms. For details visit [email or web address].   Also download the MyChart app! Go to the app store, search "MyChart", open the app, select [place], and log in with your MyChart username and password.  Masks are optional in the cancer centers. If you would like for your care team to wear a mask while they are taking care of you, please let them know. You may have one support person who is at least 72 years old accompany you for your appointments. 

## 2021-12-17 ENCOUNTER — Inpatient Hospital Stay: Payer: PPO

## 2021-12-17 VITALS — BP 106/70 | HR 81 | Resp 18

## 2021-12-17 DIAGNOSIS — C9200 Acute myeloblastic leukemia, not having achieved remission: Secondary | ICD-10-CM

## 2021-12-17 DIAGNOSIS — Z5111 Encounter for antineoplastic chemotherapy: Secondary | ICD-10-CM | POA: Diagnosis not present

## 2021-12-17 MED ORDER — AZACITIDINE CHEMO SQ INJECTION
50.0000 mg/m2 | Freq: Once | INTRAMUSCULAR | Status: AC
  Administered 2021-12-17: 87.5 mg via SUBCUTANEOUS
  Filled 2021-12-17: qty 3.5

## 2021-12-17 MED ORDER — ONDANSETRON HCL 4 MG PO TABS
8.0000 mg | ORAL_TABLET | Freq: Once | ORAL | Status: AC
  Administered 2021-12-17: 8 mg via ORAL

## 2021-12-18 ENCOUNTER — Inpatient Hospital Stay: Payer: PPO

## 2021-12-18 VITALS — BP 112/72 | HR 86 | Temp 97.6°F | Resp 18

## 2021-12-18 DIAGNOSIS — Z5111 Encounter for antineoplastic chemotherapy: Secondary | ICD-10-CM | POA: Diagnosis not present

## 2021-12-18 DIAGNOSIS — C9200 Acute myeloblastic leukemia, not having achieved remission: Secondary | ICD-10-CM

## 2021-12-18 MED ORDER — ONDANSETRON HCL 4 MG PO TABS
8.0000 mg | ORAL_TABLET | Freq: Once | ORAL | Status: AC
  Administered 2021-12-18: 8 mg via ORAL
  Filled 2021-12-18: qty 2

## 2021-12-18 MED ORDER — AZACITIDINE CHEMO SQ INJECTION
50.0000 mg/m2 | Freq: Once | INTRAMUSCULAR | Status: AC
  Administered 2021-12-18: 87.5 mg via SUBCUTANEOUS
  Filled 2021-12-18: qty 3.5

## 2021-12-19 ENCOUNTER — Inpatient Hospital Stay: Payer: PPO

## 2021-12-19 VITALS — BP 133/76 | HR 79 | Temp 97.3°F | Resp 18

## 2021-12-19 DIAGNOSIS — Z5111 Encounter for antineoplastic chemotherapy: Secondary | ICD-10-CM | POA: Diagnosis not present

## 2021-12-19 DIAGNOSIS — C9200 Acute myeloblastic leukemia, not having achieved remission: Secondary | ICD-10-CM

## 2021-12-19 MED ORDER — AZACITIDINE CHEMO SQ INJECTION
50.0000 mg/m2 | Freq: Once | INTRAMUSCULAR | Status: AC
  Administered 2021-12-19: 87.5 mg via SUBCUTANEOUS
  Filled 2021-12-19: qty 3.5

## 2021-12-19 MED ORDER — ONDANSETRON HCL 4 MG PO TABS
8.0000 mg | ORAL_TABLET | Freq: Once | ORAL | Status: AC
  Administered 2021-12-19: 8 mg via ORAL
  Filled 2021-12-19: qty 2

## 2021-12-19 NOTE — Patient Instructions (Signed)
[facility]  Discharge Instructions: Thank you for choosing [facility] to provide your oncology and hematology care.  If you have a lab appointment with the Cancer Center, please go directly to the Cancer Center and check in at the registration area.  Wear comfortable clothing and clothing appropriate for easy access to any Portacath or PICC line.   We strive to give you quality time with your provider. You may need to reschedule your appointment if you arrive late (15 or more minutes).  Arriving late affects you and other patients whose appointments are after yours.  Also, if you miss three or more appointments without notifying the office, you may be dismissed from the clinic at the provider's discretion.      For prescription refill requests, have your pharmacy contact our office and allow 72 hours for refills to be completed.    Today you received the following chemotherapy and/or immunotherapy agents Vidaza      To help prevent nausea and vomiting after your treatment, we encourage you to take your nausea medication as directed.  BELOW ARE SYMPTOMS THAT SHOULD BE REPORTED IMMEDIATELY: *FEVER GREATER THAN 100.4 F (38 C) OR HIGHER *CHILLS OR SWEATING *NAUSEA AND VOMITING THAT IS NOT CONTROLLED WITH YOUR NAUSEA MEDICATION *UNUSUAL SHORTNESS OF BREATH *UNUSUAL BRUISING OR BLEEDING *URINARY PROBLEMS (pain or burning when urinating, or frequent urination) *BOWEL PROBLEMS (unusual diarrhea, constipation, pain near the anus) TENDERNESS IN MOUTH AND THROAT WITH OR WITHOUT PRESENCE OF ULCERS (sore throat, sores in mouth, or a toothache) UNUSUAL RASH, SWELLING OR PAIN  UNUSUAL VAGINAL DISCHARGE OR ITCHING   Items with * indicate a potential emergency and should be followed up as soon as possible or go to the Emergency Department if any problems should occur.  Please show the CHEMOTHERAPY ALERT CARD or IMMUNOTHERAPY ALERT CARD at check-in to the  Emergency Department and triage nurse.  Should you have questions after your visit or need to cancel or reschedule your appointment, please contact [facility]  [phone] and follow the prompts.  Office hours are 8:00 a.m. to 4:30 p.m. Monday - Friday. Please note that voicemails left after 4:00 p.m. may not be returned until the following business day.  We are closed weekends and major holidays. You have access to a nurse at all times for urgent questions. Please call the main number to the clinic [phone] and follow the prompts.  For any non-urgent questions, you may also contact your provider using MyChart. We now offer e-Visits for anyone 18 and older to request care online for non-urgent symptoms. For details visit [email or web address].   Also download the MyChart app! Go to the app store, search "MyChart", open the app, select [facility], and log in with your MyChart username and password.  Masks are optional in the cancer centers. If you would like for your care team to wear a mask while they are taking care of you, please let them know. For doctor visits, patients may have with them one support person who is at least 72 years old. At this time, visitors are not allowed in the infusion area.   

## 2021-12-26 ENCOUNTER — Encounter: Payer: Self-pay | Admitting: Internal Medicine

## 2021-12-26 ENCOUNTER — Ambulatory Visit (INDEPENDENT_AMBULATORY_CARE_PROVIDER_SITE_OTHER): Payer: PPO | Admitting: Internal Medicine

## 2021-12-26 DIAGNOSIS — K219 Gastro-esophageal reflux disease without esophagitis: Secondary | ICD-10-CM

## 2021-12-26 DIAGNOSIS — E039 Hypothyroidism, unspecified: Secondary | ICD-10-CM | POA: Diagnosis not present

## 2021-12-26 DIAGNOSIS — R195 Other fecal abnormalities: Secondary | ICD-10-CM

## 2021-12-26 DIAGNOSIS — N898 Other specified noninflammatory disorders of vagina: Secondary | ICD-10-CM | POA: Diagnosis not present

## 2021-12-26 DIAGNOSIS — T451X5A Adverse effect of antineoplastic and immunosuppressive drugs, initial encounter: Secondary | ICD-10-CM

## 2021-12-26 DIAGNOSIS — C9200 Acute myeloblastic leukemia, not having achieved remission: Secondary | ICD-10-CM | POA: Diagnosis not present

## 2021-12-26 DIAGNOSIS — F320 Major depressive disorder, single episode, mild: Secondary | ICD-10-CM

## 2021-12-26 DIAGNOSIS — F439 Reaction to severe stress, unspecified: Secondary | ICD-10-CM

## 2021-12-26 DIAGNOSIS — G620 Drug-induced polyneuropathy: Secondary | ICD-10-CM | POA: Diagnosis not present

## 2021-12-26 DIAGNOSIS — Z85850 Personal history of malignant neoplasm of thyroid: Secondary | ICD-10-CM

## 2021-12-26 DIAGNOSIS — Z853 Personal history of malignant neoplasm of breast: Secondary | ICD-10-CM

## 2021-12-26 DIAGNOSIS — E7800 Pure hypercholesterolemia, unspecified: Secondary | ICD-10-CM

## 2021-12-26 NOTE — Patient Instructions (Signed)
Hyaluronic acid suppositories

## 2021-12-26 NOTE — Progress Notes (Signed)
Patient ID: Debra Hale, female   DOB: 09/20/1950, 71 y.o.   MRN: [id number]   Subjective:    Patient ID: Debra Hale, female    DOB: 08-21-1950, 71 y.o.   MRN: [id number]   Patient here for  Chief Complaint  Patient presents with   Follow-up    3 month follow up   .   HPI Here to follow up regarding her cholesterol, AML and depression/stress.  Seeing Dr [name] - treatment AML.  Reports some decreased appetite.  Breathing overall stable.  No vomiting reported.  Does report fatigue.  Still with intermittent diarrhea.  Uses intermittent imodium.  Increased stress with above and some family issues.  On lexapro and clonazepam.  Discussed vaginal dryness.  Given history, would like to avoid topical estrogen.  Discussed hyaluronic acid suppositories.     Past Medical History:  Diagnosis Date   Anxiety    Arthritis    Osteoarthritis   Breast cancer ([place]) 2009   right breast lumpectomy with rad tx   Colon cancer ([place])    surgery with chemo and rad tx   Complication of anesthesia    GERD (gastroesophageal reflux disease)    Heart palpitations    History of hiatal hernia    History of kidney stones    Hypothyroidism    Liver disease    Liver nodule    s/p negative biopsy   Malignant neoplasm of thyroid gland ([place]) 2002   s/p surgery and XRT   Osteoporosis    Other and unspecified hyperlipidemia    Palpitations    Personal history of chemotherapy    Personal history of malignant neoplasm of large intestine    carcinoma - cecum, s/p right laparoscopic colectomy - s/p chemotherapy and XRT   Personal history of radiation therapy    Pneumonia 2019   PONV (postoperative nausea and vomiting)    Pure hypercholesterolemia    Unspecified hereditary and idiopathic peripheral neuropathy    Past Surgical History:  Procedure Laterality Date   APPENDECTOMY  1985   BREAST BIOPSY Right 2009   positive   BREAST BIOPSY Right 2009   negative   BREAST LUMPECTOMY Right 2009   breast  cancer   CHOLECYSTECTOMY  1995   COLONOSCOPY     COLONOSCOPY WITH PROPOFOL N/A 10/29/2017   Procedure: COLONOSCOPY WITH PROPOFOL;  Surgeon: Manya Silvas, MD;  Location[facility] ENDOSCOPY;  Service: Endoscopy;  Laterality: N/A;   DILATION AND CURETTAGE OF UTERUS  1990   DILATION AND CURETTAGE, DIAGNOSTIC / THERAPEUTIC  1990   ESOPHAGOGASTRODUODENOSCOPY     ESOPHAGOGASTRODUODENOSCOPY (EGD) WITH PROPOFOL N/A 10/29/2017   Procedure: ESOPHAGOGASTRODUODENOSCOPY (EGD) WITH PROPOFOL;  Surgeon: Manya Silvas, MD;  Location: [facility] ENDOSCOPY;  Service: Endoscopy;  Laterality: N/A;   INCISION AND DRAINAGE PERIRECTAL ABSCESS N/A 06/17/2020   Procedure: IRRIGATION AND DEBRIDEMENT PERIRECTAL ABSCESS;  Surgeon: Jules Husbands, MD;  Location: [facility] ORS;  Service: General;  Laterality: N/A;   IR BONE MARROW BIOPSY & ASPIRATION  07/09/2021   LAPAROSCOPIC PARTIAL COLECTOMY     stage 3-C carcinoma of the cecum, s/p chemotherapy and xrt   LITHOTRIPSY     SIGMOIDOSCOPY  08/26/1993   THYROID LOBECTOMY  2002   s/p XRT   TOTAL HIP ARTHROPLASTY Right 10/24/2019   Procedure: TOTAL HIP ARTHROPLASTY;  Surgeon: Corky Mull, MD;  Location: [facility] ORS;  Service: Orthopedics;  Laterality: Right;   Family History  Problem Relation Age of Onset   Stroke Mother  60s   Alzheimer's disease Mother    Lung cancer Father    Prostate cancer Father    Cancer Father        Colon   Colon cancer Father    Breast cancer Sister        40's   Lung cancer Sister    Breast cancer Maternal Aunt    Social History   Socioeconomic History   Marital status: Single    Spouse name: Not on file   Number of children: 0   Years of education: Not on file   Highest education level: Not on file  Occupational History   Not on file  Tobacco Use   Smoking status: Never   Smokeless tobacco: Never  Vaping Use   Vaping Use: Never used  Substance and Sexual Activity   Alcohol use: No    Alcohol/week: 0.0 standard drinks of  alcohol   Drug use: No   Sexual activity: Not Currently  Other Topics Concern   Not on file  Social History Narrative   Not on file   Social Determinants of Health   Financial Resource Strain: Low Risk  (09/11/2021)   Overall Financial Resource Strain (CARDIA)    Difficulty of Paying Living Expenses: Not very hard  Recent Concern: Financial Resource Strain - Medium Risk (07/21/2021)   Overall Financial Resource Strain (CARDIA)    Difficulty of Paying Living Expenses: Somewhat hard  Food Insecurity: No Food Insecurity (09/11/2021)   Hunger Vital Sign    Worried About Running Out of Food in the Last Year: Never true    Ran Out of Food in the Last Year: Never true  Transportation Needs: No Transportation Needs (09/11/2021)   PRAPARE - Hydrologist (Medical): No    Lack of Transportation (Non-Medical): No  Physical Activity: Insufficiently Active (09/11/2021)   Exercise Vital Sign    Days of Exercise per Week: 3 days    Minutes of Exercise per Session: 10 min  Stress: No Stress Concern Present (09/11/2021)   [place]    Feeling of Stress : Not at all  Social Connections: Socially Isolated (09/11/2021)   Social Connection and Isolation Panel [NHANES]    Frequency of Communication with Friends and Family: Once a week    Frequency of Social Gatherings with Friends and Family: Once a week    Attends Religious Services: Never    Marine scientist or Organizations: No    Attends Archivist Meetings: Never    Marital Status: Divorced     Review of Systems  Constitutional:  Positive for appetite change and fatigue. Negative for unexpected weight change.  HENT:  Negative for congestion and sinus pressure.   Respiratory:  Negative for cough, chest tightness and shortness of breath.   Cardiovascular:  Negative for chest pain, palpitations and leg swelling.  Gastrointestinal:   Positive for diarrhea. Negative for abdominal pain and vomiting.  Genitourinary:  Negative for difficulty urinating and dysuria.  Musculoskeletal:  Negative for joint swelling and myalgias.  Skin:  Negative for color change and rash.  Neurological:  Negative for dizziness, light-headedness and headaches.  Psychiatric/Behavioral:  Negative for agitation.        Increased stress as outlined.  Some depression symptoms.  No SI.        Objective:     BP 118/78 (BP Location: Left Arm, Patient Position: Sitting, Cuff Size: Normal)   Pulse  96   Temp 97.6 F (36.4 C) (Oral)   Resp 14   Ht 5' 4.5" (1.638 m)   Wt 130 lb (59 kg)   SpO2 99%   BMI 21.97 kg/m  Wt Readings from Last 3 Encounters:  12/26/21 130 lb (59 kg)  12/16/21 132 lb (59.9 kg)  12/15/21 130 lb 15.3 oz (59.4 kg)    Physical Exam Vitals reviewed.  Constitutional:      General: She is not in acute distress.    Appearance: Normal appearance.  HENT:     Head: Normocephalic and atraumatic.     Right Ear: External ear normal.     Left Ear: External ear normal.  Eyes:     General: No scleral icterus.       Right eye: No discharge.        Left eye: No discharge.     Conjunctiva/sclera: Conjunctivae normal.  Neck:     Thyroid: No thyromegaly.  Cardiovascular:     Rate and Rhythm: Normal rate and regular rhythm.  Pulmonary:     Effort: No respiratory distress.     Breath sounds: Normal breath sounds. No wheezing.  Abdominal:     General: Bowel sounds are normal.     Palpations: Abdomen is soft.     Tenderness: There is no abdominal tenderness.  Musculoskeletal:        General: No swelling or tenderness.     Cervical back: Neck supple. No tenderness.  Lymphadenopathy:     Cervical: No cervical adenopathy.  Skin:    Findings: No erythema or rash.  Neurological:     Mental Status: She is alert.  Psychiatric:        Mood and Affect: Mood normal.        Behavior: Behavior normal.      Outpatient Encounter  Medications as of 12/26/2021  Medication Sig   Azelastine-Fluticasone 137-50 MCG/ACT SUSP SPRAY 2 SPRAYS INTO EACH NOSTRIL EVERY DAY   clonazePAM (KLONOPIN) 0.5 MG tablet Take 1.5 mg by mouth 2 (two) times daily.    clotrimazole (LOTRIMIN) 1 % cream Apply 1 application. topically 2 (two) times daily.   diphenoxylate-atropine (LOMOTIL) 2.5-0.025 MG tablet Take 1 tablet by mouth 4 (four) times daily as needed for diarrhea or loose stools.   escitalopram (LEXAPRO) 20 MG tablet Take 20 mg by mouth every morning.    levothyroxine (SYNTHROID) 125 MCG tablet TAKE 1 TABLET EVERY DAY ON EMPTY STOMACHWITH A GLASS OF WATER AT LEAST 30-60 MINBEFORE BREAKFAST   loperamide (IMODIUM) 2 MG capsule Take by mouth.   metoprolol tartrate (LOPRESSOR) 25 MG tablet TAKE 1 TABLET (25 MG TOTAL) BY MOUTH AS NEEDED (AS NEEDED UP TO TWICE DAILY FOR PALPITATIONS).   ondansetron (ZOFRAN) 4 MG tablet Take 4-8 mg by mouth every 8 (eight) hours as needed.   ondansetron (ZOFRAN) 8 MG tablet Take 1 tablet (8 mg total) by mouth 2 (two) times daily as needed for nausea or vomiting.   pantoprazole (PROTONIX) 40 MG tablet TAKE ONE TABLET (40 MG) BY MOUTH EVERY DAY   potassium chloride SA (KLOR-CON M) 20 MEQ tablet TAKE ONE (1) TABLET BY MOUTH TWO TIMES PER DAY   valACYclovir (VALTREX) 500 MG tablet Take 1 tablet (500 mg total) by mouth daily.   venetoclax (VENCLEXTA) 100 MG tablet Take 4 tablets (400 mg total) by mouth daily. Take for 14 days, then hold for 14 days. Repeat every 28 days. Take with a meal and a full glass of  water.   Facility-Administered Encounter Medications as of 12/26/2021  Medication   ondansetron (ZOFRAN) tablet 8 mg   potassium chloride (KLOR-CON) CR tablet 40 mEq     Lab Results  Component Value Date   WBC 1.9 (L) 12/15/2021   HGB 12.3 12/15/2021   HCT 35.5 (L) 12/15/2021   PLT 158 12/15/2021   GLUCOSE 101 (H) 12/15/2021   CHOL 163 02/20/2020   TRIG 110.0 02/20/2020   HDL 40.70 02/20/2020   LDLDIRECT  126.0 07/11/2015   LDLCALC 100 (H) 02/20/2020   ALT 27 12/15/2021   AST 15 12/15/2021   NA 139 12/15/2021   K 3.1 (L) 12/15/2021   CL 103 12/15/2021   CREATININE 0.87 12/15/2021   BUN 18 12/15/2021   CO2 27 12/15/2021   TSH 0.034 (L) 10/02/2020   HGBA1C 5.3 12/10/2016    MM 3D SCREEN BREAST BILATERAL  Result Date: 07/30/2021 CLINICAL DATA:  Screening. EXAM: DIGITAL SCREENING BILATERAL MAMMOGRAM WITH TOMOSYNTHESIS AND CAD TECHNIQUE: Bilateral screening digital craniocaudal and mediolateral oblique mammograms were obtained. Bilateral screening digital breast tomosynthesis was performed. The images were evaluated with computer-aided detection. COMPARISON:  Previous exam(s). ACR Breast Density Category c: The breast tissue is heterogeneously dense, which may obscure small masses. FINDINGS: There are no findings suspicious for malignancy. RIGHT breast surgical changes again noted. IMPRESSION: No mammographic evidence of malignancy. A result letter of this screening mammogram will be mailed directly to the patient. RECOMMENDATION: Screening mammogram in one year. (Code:SM-B-01Y) BI-RADS CATEGORY  2: Benign. Electronically Signed   By: Margarette [place] M.D.   On: 07/30/2021 09:19   DG Bone Density  Result Date: 07/29/2021 EXAM: DUAL X-RAY ABSORPTIOMETRY (DXA) FOR BONE MINERAL DENSITY IMPRESSION: Dear Dr. Nicki Reaper, Your patient EARTHA VONBEHREN completed a FRAX assessment on 07/29/2021 using the [place] (analysis version: 14.10) manufactured by [employer]. The following summarizes the results of our evaluation. PATIENT BIOGRAPHICAL: Name: Rifky, Lapre Patient ID: [id number] Birth Date: [birth year] Height:    64.0 in. Gender:     Female    [age]       Weight:    128.8 lbs. Ethnicity:  White                            Exam Date: 07/29/2021 FRAX* RESULTS:  (version: 3.5) 10-year Probability of Fracture1 Major Osteoporotic Fracture2 Hip Fracture 12.2% 2.9% Population: [place] (Caucasian) Risk  Factors: None Based on Femur (Left) Neck BMD 1 -The 10-year probability of fracture may be lower than reported if the patient has received treatment. 2 -Major Osteoporotic Fracture: Clinical Spine, Forearm, Hip or Shoulder *FRAX is a Materials engineer of the [employer] for Metabolic Bone Disease, a [place] (WHO) [facility]. ASSESSMENT: The probability of a major osteoporotic fracture is 12.2% within the next ten years. The probability of a hip fracture is 2.9% within the next ten years. . Your patient Colleene Swarthout completed a BMD test on 07/29/2021 using the [place] (software version: 14.10) manufactured by [employer]. The following summarizes the results of our evaluation. Technologist: [facility] PATIENT BIOGRAPHICAL: Name: Vernie, Vinciguerra Patient ID: [id number] Birth Date: 25-Jan-1951 Height: 64.0 in. Gender: Female Exam Date: 07/29/2021 Weight: 128.8 lbs. Indications: Advanced Age, Caucasian, History of Breast Cancer, History of Chemo, History of colon cancer, History of Radiation, History of thyroid cancer, Leukemia, Postmenopausal, Previous  Chemo and Radiation, right hip replacement Fractures: Treatments: Synthroid DENSITOMETRY RESULTS: Site         Region     Measured Date Measured Age WHO Classification Young Adult T-score BMD         %Change vs. Previous Significant Change (*) AP Spine L1-L3 07/29/2020 71.7 Osteopenia -1.9 0.949 g/cm2 -9.0% Yes AP Spine L1-L3 12/08/2011 62.0 Osteopenia -1.1 1.043 g/cm2 - - Left Femur Total 07/29/2021 71.7 Osteopenia -2.1 0.739 g/cm2 -9.3% Yes Left Femur Total 12/08/2011 62.0 Osteopenia -1.5 0.815 g/cm2 - - Left Forearm Radius 33% 07/29/2021 71.7 Osteopenia -2.4 0.663 g/cm2 -4.6% - Left Forearm Radius 33% 12/08/2011 62.0 Osteopenia -2.1 0.695 g/cm2 - - ASSESSMENT: The BMD measured at Forearm Radius 33% is 0.663 g/cm2 with a T-score of -2.4. This patient is considered osteopenic according to [facility]) criteria. The scan quality is good. L-4 was excluded due to degenerative changes. Compared with prior study, there has been significant decrease in the spine. World Pharmacologist [facility]) criteria for post-menopausal, Caucasian Women: Normal:                   T-score at or above -1 SD Osteopenia/low bone mass: T-score between -1 and -2.5 SD Osteoporosis:             T-score at or below -2.5 SD RECOMMENDATIONS: 1. All patients should optimize calcium and vitamin D intake. 2. Consider FDA-approved medical therapies in postmenopausal women and men aged 16 years and older, based on the following: a. A hip or vertebral(clinical or morphometric) fracture b. T-score < -2.5 at the femoral neck or spine after appropriate evaluation to exclude secondary causes c. Low bone mass (T-score between -1.0 and -2.5 at the femoral neck or spine) and a 10-year probability of a hip fracture > 3% or a 10-year probability of a major osteoporosis-related fracture > 20% based on the [place]-adapted WHO algorithm 3. Clinician judgment and/or patient preferences may indicate treatment for people with 10-year fracture probabilities above or below these levels FOLLOW-UP: People with diagnosed cases of osteoporosis or at high risk for fracture should have regular bone mineral density tests. For patients eligible for Medicare, routine testing is allowed once every 2 years. The testing frequency can be increased to one year for patients who have rapidly progressing disease, those who are receiving or discontinuing medical therapy to restore bone mass, or have additional risk factors. I have reviewed this report, and agree with the above findings. Mark A. Thornton Papas, M.D. [facility] Electronically Signed   By: Lavonia Dana M.D.   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  Undergoing treatments.   Discussed.       Chemotherapy-induced neuropathy (HCC)    Stable.       GERD (gastroesophageal reflux disease)    No upper symptoms reported. On protonix.       History of breast cancer    Mammogram 07/29/21 - Birads II.       History of thyroid cancer    Follow tsh.       Hypercholesterolemia    Follow lipid panel.       Hypothyroidism    On thyroid replacement.  Follow tsh.       Loose stools    Taking imodium.  Helps.  Follow.  Major depressive disorder, single episode, mild (HCC)    Seeing Dr Clovis Riley.  Currently on lexapro and clonazepam.  Denies SI.  Follow.  Continue f/u with psychiatry.       Stress    Increased stress with her current medical issues.  Seeing Dr Clovis Riley. On lexapro and clonazepam.   Follow.  Notify me if feels needs any further intervention.        Vaginal dryness    Discussed given her history will try to avoid estrogen.  Trial of hyaluronic acid suppositories.  Follow.         [name], MD

## 2021-12-29 ENCOUNTER — Other Ambulatory Visit: Payer: Self-pay | Admitting: Oncology

## 2021-12-29 DIAGNOSIS — C9200 Acute myeloblastic leukemia, not having achieved remission: Secondary | ICD-10-CM

## 2021-12-29 NOTE — Progress Notes (Signed)
OFF PATHWAY REGIMEN - Other  No Change  Continue With Treatment as Ordered.  Original Decision Date/Time: 06/01/2020 07:56   [id number]:Azacitidine 75 mg/m2 SUBQ D1-7 q28 Days:   A cycle is every 28 days:     Azacitidine   **Always confirm dose/schedule in your pharmacy ordering system**  Patient Characteristics: Intent of Therapy: Non-Curative / Palliative Intent, Discussed with Patient

## 2022-01-01 ENCOUNTER — Other Ambulatory Visit (HOSPITAL_COMMUNITY): Payer: Self-pay

## 2022-01-05 ENCOUNTER — Encounter: Payer: Self-pay | Admitting: Internal Medicine

## 2022-01-05 DIAGNOSIS — N898 Other specified noninflammatory disorders of vagina: Secondary | ICD-10-CM | POA: Insufficient documentation

## 2022-01-05 NOTE — Assessment & Plan Note (Signed)
No upper symptoms reported.  On protonix.   

## 2022-01-05 NOTE — Assessment & Plan Note (Signed)
Discussed given her history will try to avoid estrogen.  Trial of hyaluronic acid suppositories.  Follow.

## 2022-01-05 NOTE — Assessment & Plan Note (Signed)
Stable

## 2022-01-05 NOTE — Assessment & Plan Note (Signed)
On thyroid replacement.  Follow tsh.  

## 2022-01-05 NOTE — Assessment & Plan Note (Signed)
Follow tsh.

## 2022-01-05 NOTE — Assessment & Plan Note (Signed)
Taking imodium.  Helps.  Follow.

## 2022-01-05 NOTE — Assessment & Plan Note (Signed)
Mammogram 07/29/21 - Birads II.  

## 2022-01-05 NOTE — Assessment & Plan Note (Signed)
Seeing Dr [name].  Undergoing treatments.  Discussed.

## 2022-01-05 NOTE — Assessment & Plan Note (Signed)
Seeing Dr Levine.  Currently on lexapro and clonazepam.  Denies SI.  Follow.  Continue f/u with psychiatry.  

## 2022-01-05 NOTE — Assessment & Plan Note (Signed)
Increased stress with her current medical issues.  Seeing Dr Clovis Riley. On lexapro and clonazepam.   Follow.  Notify me if feels needs any further intervention.

## 2022-01-05 NOTE — Assessment & Plan Note (Signed)
Follow lipid panel.   

## 2022-01-06 ENCOUNTER — Other Ambulatory Visit (HOSPITAL_COMMUNITY): Payer: Self-pay

## 2022-01-12 ENCOUNTER — Inpatient Hospital Stay: Payer: PPO | Admitting: Pharmacist

## 2022-01-12 ENCOUNTER — Inpatient Hospital Stay: Payer: PPO | Attending: Oncology

## 2022-01-12 ENCOUNTER — Inpatient Hospital Stay: Payer: PPO

## 2022-01-12 VITALS — BP 140/80 | HR 87 | Temp 97.9°F | Resp 16 | Wt 130.0 lb

## 2022-01-12 VITALS — BP 144/73 | HR 84 | Temp 97.0°F | Resp 16

## 2022-01-12 DIAGNOSIS — Z85038 Personal history of other malignant neoplasm of large intestine: Secondary | ICD-10-CM | POA: Insufficient documentation

## 2022-01-12 DIAGNOSIS — Z85850 Personal history of malignant neoplasm of thyroid: Secondary | ICD-10-CM | POA: Insufficient documentation

## 2022-01-12 DIAGNOSIS — Z853 Personal history of malignant neoplasm of breast: Secondary | ICD-10-CM | POA: Insufficient documentation

## 2022-01-12 DIAGNOSIS — Z79899 Other long term (current) drug therapy: Secondary | ICD-10-CM | POA: Insufficient documentation

## 2022-01-12 DIAGNOSIS — Z9049 Acquired absence of other specified parts of digestive tract: Secondary | ICD-10-CM | POA: Insufficient documentation

## 2022-01-12 DIAGNOSIS — Z9221 Personal history of antineoplastic chemotherapy: Secondary | ICD-10-CM | POA: Diagnosis not present

## 2022-01-12 DIAGNOSIS — Z923 Personal history of irradiation: Secondary | ICD-10-CM | POA: Diagnosis not present

## 2022-01-12 DIAGNOSIS — D709 Neutropenia, unspecified: Secondary | ICD-10-CM | POA: Diagnosis not present

## 2022-01-12 DIAGNOSIS — C9200 Acute myeloblastic leukemia, not having achieved remission: Secondary | ICD-10-CM | POA: Diagnosis not present

## 2022-01-12 DIAGNOSIS — Z5111 Encounter for antineoplastic chemotherapy: Secondary | ICD-10-CM | POA: Insufficient documentation

## 2022-01-12 LAB — CBC WITH DIFFERENTIAL/PLATELET
Abs Immature Granulocytes: 0 10*3/uL (ref 0.00–0.07)
Basophils Absolute: 0 10*3/uL (ref 0.0–0.1)
Basophils Relative: 1 %
Eosinophils Absolute: 0 10*3/uL (ref 0.0–0.5)
Eosinophils Relative: 0 %
HCT: 33.8 % — ABNORMAL LOW (ref 36.0–46.0)
Hemoglobin: 12 g/dL (ref 12.0–15.0)
Immature Granulocytes: 0 %
Lymphocytes Relative: 35 %
Lymphs Abs: 0.5 10*3/uL — ABNORMAL LOW (ref 0.7–4.0)
MCH: 31.3 pg (ref 26.0–34.0)
MCHC: 35.5 g/dL (ref 30.0–36.0)
MCV: 88.3 fL (ref 80.0–100.0)
Monocytes Absolute: 0.1 10*3/uL (ref 0.1–1.0)
Monocytes Relative: 9 %
Neutro Abs: 0.7 10*3/uL — ABNORMAL LOW (ref 1.7–7.7)
Neutrophils Relative %: 55 %
Platelets: 214 10*3/uL (ref 150–400)
RBC: 3.83 MIL/uL — ABNORMAL LOW (ref 3.87–5.11)
RDW: 14.6 % (ref 11.5–15.5)
WBC: 1.3 10*3/uL — CL (ref 4.0–10.5)
nRBC: 0 % (ref 0.0–0.2)

## 2022-01-12 LAB — BASIC METABOLIC PANEL
Anion gap: 6 (ref 5–15)
BUN: 17 mg/dL (ref 8–23)
CO2: 28 mmol/L (ref 22–32)
Calcium: 8.9 mg/dL (ref 8.9–10.3)
Chloride: 109 mmol/L (ref 98–111)
Creatinine, Ser: 0.83 mg/dL (ref 0.44–1.00)
GFR, Estimated: >60 mL/min (ref >60)
Glucose, Bld: 105 mg/dL — ABNORMAL HIGH (ref 70–99)
Potassium: 3.9 mmol/L (ref 3.5–5.1)
Sodium: 143 mmol/L (ref 135–145)

## 2022-01-12 MED ORDER — ONDANSETRON HCL 4 MG PO TABS
8.0000 mg | ORAL_TABLET | Freq: Once | ORAL | Status: AC
  Administered 2022-01-12: 8 mg via ORAL
  Filled 2022-01-12: qty 2

## 2022-01-12 MED ORDER — AZACITIDINE CHEMO SQ INJECTION
50.0000 mg/m2 | Freq: Once | INTRAMUSCULAR | Status: AC
  Administered 2022-01-12: 82.5 mg via SUBCUTANEOUS
  Filled 2022-01-12: qty 3.3

## 2022-01-12 NOTE — Progress Notes (Unsigned)
[facility]  [phone] [phone]  ID: Debra Hale OB: 01/04/1951  MR#: [id number]  CSN#:[id number]  Patient Care Team: Scott, Charlene, MD as PCP - General (Internal Medicine) Arida, Muhammad A, MD as PCP - Cardiology (Cardiology) Girtha Kilgore J, MD as Consulting Physician (Oncology)  CHIEF COMPLAINT: AML.  INTERVAL HISTORY: Patient returns to clinic today for further evaluation and continuation of cycle 21 of Vidaza.  She is having problems with sinuses and feels "jittery", but otherwise feels well.  She continues to have chronic weakness and fatigue.  She does not complain of diarrhea, but has had recent constipation.  She continues to be highly anxious.  She denies any pain.  She has no neurologic complaints.  She denies any recent fevers or illnesses. She has no chest pain, shortness of breath, cough, or hemoptysis.  She does not has any nausea or vomiting.  She has no urinary complaints.  Patient offers no further specific complaints today.  REVIEW OF SYSTEMS:   Review of Systems  Constitutional:  Positive for malaise/fatigue. Negative for fever and weight loss.  Respiratory: Negative.  Negative for cough, hemoptysis and shortness of breath.   Cardiovascular: Negative.  Negative for chest pain and leg swelling.  Gastrointestinal: Negative.  Negative for abdominal pain and diarrhea.  Genitourinary: Negative.  Negative for dysuria.  Musculoskeletal: Negative.  Negative for back pain and falls.  Skin: Negative.  Negative for rash.  Neurological:  Positive for weakness. Negative for dizziness, focal weakness and headaches.  Psychiatric/Behavioral:  Negative for memory loss. The patient is nervous/anxious.     As per HPI. Otherwise, a complete review of systems is negative.  PAST MEDICAL HISTORY: Past Medical History:  Diagnosis Date   Anxiety    Arthritis    Osteoarthritis   Breast cancer (HCC) 2009   right breast lumpectomy with  rad tx   Colon cancer (HCC)    surgery with chemo and rad tx   Complication of anesthesia    GERD (gastroesophageal reflux disease)    Heart palpitations    History of hiatal hernia    History of kidney stones    Hypothyroidism    Liver disease    Liver nodule    s/p negative biopsy   Malignant neoplasm of thyroid gland (HCC) 2002   s/p surgery and XRT   Osteoporosis    Other and unspecified hyperlipidemia    Palpitations    Personal history of chemotherapy    Personal history of malignant neoplasm of large intestine    carcinoma - cecum, s/p right laparoscopic colectomy - s/p chemotherapy and XRT   Personal history of radiation therapy    Pneumonia 2019   PONV (postoperative nausea and vomiting)    Pure hypercholesterolemia    Unspecified hereditary and idiopathic peripheral neuropathy     PAST SURGICAL HISTORY: Past Surgical History:  Procedure Laterality Date   APPENDECTOMY  1985   BREAST BIOPSY Right 2009   positive   BREAST BIOPSY Right 2009   negative   BREAST LUMPECTOMY Right 2009   breast cancer   CHOLECYSTECTOMY  1995   COLONOSCOPY     COLONOSCOPY WITH PROPOFOL N/A 10/29/2017   Procedure: COLONOSCOPY WITH PROPOFOL;  Surgeon: Elliott, Robert T, MD;  Location: [facility] ENDOSCOPY;  Service: Endoscopy;  Laterality: N/A;   DILATION AND CURETTAGE OF UTERUS  1990   DILATION AND CURETTAGE, DIAGNOSTIC / THERAPEUTIC  1990   ESOPHAGOGASTRODUODENOSCOPY     ESOPHAGOGASTRODUODENOSCOPY (EGD) WITH PROPOFOL N/A 10/29/2017     [facility]  [phone] [phone]  ID: Debra Hale OB: 01/04/1951  MR#: [id number]  CSN#:[id number]  Patient Care Team: Scott, Charlene, MD as PCP - General (Internal Medicine) Arida, Muhammad A, MD as PCP - Cardiology (Cardiology) Girtha Kilgore J, MD as Consulting Physician (Oncology)  CHIEF COMPLAINT: AML.  INTERVAL HISTORY: Patient returns to clinic today for further evaluation and continuation of cycle 21 of Vidaza.  She is having problems with sinuses and feels "jittery", but otherwise feels well.  She continues to have chronic weakness and fatigue.  She does not complain of diarrhea, but has had recent constipation.  She continues to be highly anxious.  She denies any pain.  She has no neurologic complaints.  She denies any recent fevers or illnesses. She has no chest pain, shortness of breath, cough, or hemoptysis.  She does not has any nausea or vomiting.  She has no urinary complaints.  Patient offers no further specific complaints today.  REVIEW OF SYSTEMS:   Review of Systems  Constitutional:  Positive for malaise/fatigue. Negative for fever and weight loss.  Respiratory: Negative.  Negative for cough, hemoptysis and shortness of breath.   Cardiovascular: Negative.  Negative for chest pain and leg swelling.  Gastrointestinal: Negative.  Negative for abdominal pain and diarrhea.  Genitourinary: Negative.  Negative for dysuria.  Musculoskeletal: Negative.  Negative for back pain and falls.  Skin: Negative.  Negative for rash.  Neurological:  Positive for weakness. Negative for dizziness, focal weakness and headaches.  Psychiatric/Behavioral:  Negative for memory loss. The patient is nervous/anxious.     As per HPI. Otherwise, a complete review of systems is negative.  PAST MEDICAL HISTORY: Past Medical History:  Diagnosis Date   Anxiety    Arthritis    Osteoarthritis   Breast cancer (HCC) 2009   right breast lumpectomy with  rad tx   Colon cancer (HCC)    surgery with chemo and rad tx   Complication of anesthesia    GERD (gastroesophageal reflux disease)    Heart palpitations    History of hiatal hernia    History of kidney stones    Hypothyroidism    Liver disease    Liver nodule    s/p negative biopsy   Malignant neoplasm of thyroid gland (HCC) 2002   s/p surgery and XRT   Osteoporosis    Other and unspecified hyperlipidemia    Palpitations    Personal history of chemotherapy    Personal history of malignant neoplasm of large intestine    carcinoma - cecum, s/p right laparoscopic colectomy - s/p chemotherapy and XRT   Personal history of radiation therapy    Pneumonia 2019   PONV (postoperative nausea and vomiting)    Pure hypercholesterolemia    Unspecified hereditary and idiopathic peripheral neuropathy     PAST SURGICAL HISTORY: Past Surgical History:  Procedure Laterality Date   APPENDECTOMY  1985   BREAST BIOPSY Right 2009   positive   BREAST BIOPSY Right 2009   negative   BREAST LUMPECTOMY Right 2009   breast cancer   CHOLECYSTECTOMY  1995   COLONOSCOPY     COLONOSCOPY WITH PROPOFOL N/A 10/29/2017   Procedure: COLONOSCOPY WITH PROPOFOL;  Surgeon: Elliott, Robert T, MD;  Location: [facility] ENDOSCOPY;  Service: Endoscopy;  Laterality: N/A;   DILATION AND CURETTAGE OF UTERUS  1990   DILATION AND CURETTAGE, DIAGNOSTIC / THERAPEUTIC  1990   ESOPHAGOGASTRODUODENOSCOPY     ESOPHAGOGASTRODUODENOSCOPY (EGD) WITH PROPOFOL N/A 10/29/2017     [facility]  [phone] [phone]  ID: Debra Hale OB: 01/04/1951  MR#: [id number]  CSN#:[id number]  Patient Care Team: Scott, Charlene, MD as PCP - General (Internal Medicine) Arida, Muhammad A, MD as PCP - Cardiology (Cardiology) Girtha Kilgore J, MD as Consulting Physician (Oncology)  CHIEF COMPLAINT: AML.  INTERVAL HISTORY: Patient returns to clinic today for further evaluation and continuation of cycle 21 of Vidaza.  She is having problems with sinuses and feels "jittery", but otherwise feels well.  She continues to have chronic weakness and fatigue.  She does not complain of diarrhea, but has had recent constipation.  She continues to be highly anxious.  She denies any pain.  She has no neurologic complaints.  She denies any recent fevers or illnesses. She has no chest pain, shortness of breath, cough, or hemoptysis.  She does not has any nausea or vomiting.  She has no urinary complaints.  Patient offers no further specific complaints today.  REVIEW OF SYSTEMS:   Review of Systems  Constitutional:  Positive for malaise/fatigue. Negative for fever and weight loss.  Respiratory: Negative.  Negative for cough, hemoptysis and shortness of breath.   Cardiovascular: Negative.  Negative for chest pain and leg swelling.  Gastrointestinal: Negative.  Negative for abdominal pain and diarrhea.  Genitourinary: Negative.  Negative for dysuria.  Musculoskeletal: Negative.  Negative for back pain and falls.  Skin: Negative.  Negative for rash.  Neurological:  Positive for weakness. Negative for dizziness, focal weakness and headaches.  Psychiatric/Behavioral:  Negative for memory loss. The patient is nervous/anxious.     As per HPI. Otherwise, a complete review of systems is negative.  PAST MEDICAL HISTORY: Past Medical History:  Diagnosis Date   Anxiety    Arthritis    Osteoarthritis   Breast cancer (HCC) 2009   right breast lumpectomy with  rad tx   Colon cancer (HCC)    surgery with chemo and rad tx   Complication of anesthesia    GERD (gastroesophageal reflux disease)    Heart palpitations    History of hiatal hernia    History of kidney stones    Hypothyroidism    Liver disease    Liver nodule    s/p negative biopsy   Malignant neoplasm of thyroid gland (HCC) 2002   s/p surgery and XRT   Osteoporosis    Other and unspecified hyperlipidemia    Palpitations    Personal history of chemotherapy    Personal history of malignant neoplasm of large intestine    carcinoma - cecum, s/p right laparoscopic colectomy - s/p chemotherapy and XRT   Personal history of radiation therapy    Pneumonia 2019   PONV (postoperative nausea and vomiting)    Pure hypercholesterolemia    Unspecified hereditary and idiopathic peripheral neuropathy     PAST SURGICAL HISTORY: Past Surgical History:  Procedure Laterality Date   APPENDECTOMY  1985   BREAST BIOPSY Right 2009   positive   BREAST BIOPSY Right 2009   negative   BREAST LUMPECTOMY Right 2009   breast cancer   CHOLECYSTECTOMY  1995   COLONOSCOPY     COLONOSCOPY WITH PROPOFOL N/A 10/29/2017   Procedure: COLONOSCOPY WITH PROPOFOL;  Surgeon: Elliott, Robert T, MD;  Location: [facility] ENDOSCOPY;  Service: Endoscopy;  Laterality: N/A;   DILATION AND CURETTAGE OF UTERUS  1990   DILATION AND CURETTAGE, DIAGNOSTIC / THERAPEUTIC  1990   ESOPHAGOGASTRODUODENOSCOPY     ESOPHAGOGASTRODUODENOSCOPY (EGD) WITH PROPOFOL N/A 10/29/2017     [facility]  [phone] [phone]  ID: Debra Hale OB: 01/04/1951  MR#: [id number]  CSN#:[id number]  Patient Care Team: Scott, Charlene, MD as PCP - General (Internal Medicine) Arida, Muhammad A, MD as PCP - Cardiology (Cardiology) Girtha Kilgore J, MD as Consulting Physician (Oncology)  CHIEF COMPLAINT: AML.  INTERVAL HISTORY: Patient returns to clinic today for further evaluation and continuation of cycle 21 of Vidaza.  She is having problems with sinuses and feels "jittery", but otherwise feels well.  She continues to have chronic weakness and fatigue.  She does not complain of diarrhea, but has had recent constipation.  She continues to be highly anxious.  She denies any pain.  She has no neurologic complaints.  She denies any recent fevers or illnesses. She has no chest pain, shortness of breath, cough, or hemoptysis.  She does not has any nausea or vomiting.  She has no urinary complaints.  Patient offers no further specific complaints today.  REVIEW OF SYSTEMS:   Review of Systems  Constitutional:  Positive for malaise/fatigue. Negative for fever and weight loss.  Respiratory: Negative.  Negative for cough, hemoptysis and shortness of breath.   Cardiovascular: Negative.  Negative for chest pain and leg swelling.  Gastrointestinal: Negative.  Negative for abdominal pain and diarrhea.  Genitourinary: Negative.  Negative for dysuria.  Musculoskeletal: Negative.  Negative for back pain and falls.  Skin: Negative.  Negative for rash.  Neurological:  Positive for weakness. Negative for dizziness, focal weakness and headaches.  Psychiatric/Behavioral:  Negative for memory loss. The patient is nervous/anxious.     As per HPI. Otherwise, a complete review of systems is negative.  PAST MEDICAL HISTORY: Past Medical History:  Diagnosis Date   Anxiety    Arthritis    Osteoarthritis   Breast cancer (HCC) 2009   right breast lumpectomy with  rad tx   Colon cancer (HCC)    surgery with chemo and rad tx   Complication of anesthesia    GERD (gastroesophageal reflux disease)    Heart palpitations    History of hiatal hernia    History of kidney stones    Hypothyroidism    Liver disease    Liver nodule    s/p negative biopsy   Malignant neoplasm of thyroid gland (HCC) 2002   s/p surgery and XRT   Osteoporosis    Other and unspecified hyperlipidemia    Palpitations    Personal history of chemotherapy    Personal history of malignant neoplasm of large intestine    carcinoma - cecum, s/p right laparoscopic colectomy - s/p chemotherapy and XRT   Personal history of radiation therapy    Pneumonia 2019   PONV (postoperative nausea and vomiting)    Pure hypercholesterolemia    Unspecified hereditary and idiopathic peripheral neuropathy     PAST SURGICAL HISTORY: Past Surgical History:  Procedure Laterality Date   APPENDECTOMY  1985   BREAST BIOPSY Right 2009   positive   BREAST BIOPSY Right 2009   negative   BREAST LUMPECTOMY Right 2009   breast cancer   CHOLECYSTECTOMY  1995   COLONOSCOPY     COLONOSCOPY WITH PROPOFOL N/A 10/29/2017   Procedure: COLONOSCOPY WITH PROPOFOL;  Surgeon: Elliott, Robert T, MD;  Location: [facility] ENDOSCOPY;  Service: Endoscopy;  Laterality: N/A;   DILATION AND CURETTAGE OF UTERUS  1990   DILATION AND CURETTAGE, DIAGNOSTIC / THERAPEUTIC  1990   ESOPHAGOGASTRODUODENOSCOPY     ESOPHAGOGASTRODUODENOSCOPY (EGD) WITH PROPOFOL N/A 10/29/2017  

## 2022-01-12 NOTE — Progress Notes (Signed)
[facility]  [phone] [phone]  Patient Care Team: [name], MD as PCP - General (Internal Medicine) Wellington Hampshire, MD as PCP - Cardiology (Cardiology) Lloyd Huger, MD as Consulting Physician (Oncology)   Name of the patient: Debra Hale  [id number]  [birth year]   Date of visit: 01/12/22  HPI: Patient is a 71 y.o. 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   Bone marrow biopsy on 07/09/21 revealed an increased blast count to 5%, at that time patient was on azacitidine monotherapy. Patient reinitiated venetoclax 14 on/14 off on 08/26/21.  Reason for Consult: Oral chemotherapy follow-up for venetoclax therapy.   PAST MEDICAL HISTORY: Past Medical History:  Diagnosis Date   Anxiety    Arthritis    Osteoarthritis   Breast cancer ([place]) 2009   right breast lumpectomy with rad tx   Colon cancer ([place])    surgery with chemo and rad tx   Complication of anesthesia    GERD (gastroesophageal reflux disease)    Heart palpitations    History of hiatal hernia    History of kidney stones    Hypothyroidism    Liver disease    Liver nodule    s/p negative biopsy   Malignant neoplasm of thyroid gland ([place]) 2002   s/p surgery and XRT   Osteoporosis    Other and unspecified hyperlipidemia    Palpitations    Personal history of chemotherapy    Personal history of malignant neoplasm of large intestine    carcinoma - cecum, s/p right laparoscopic colectomy - s/p chemotherapy and XRT   Personal history of radiation therapy    Pneumonia 2019   PONV (postoperative nausea and vomiting)    Pure hypercholesterolemia    Unspecified hereditary and idiopathic peripheral neuropathy     HEMATOLOGY/ONCOLOGY HISTORY:  Oncology History  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[place])  04/12/2020 Initial Diagnosis   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  MEDICATIONS:  Current Outpatient Medications  Medication Sig Dispense Refill   Azelastine-Fluticasone 137-50 MCG/ACT SUSP SPRAY 2 SPRAYS INTO EACH NOSTRIL EVERY DAY 23 g 3   clonazePAM (KLONOPIN) 0.5 MG tablet Take 1.5 mg by mouth 2 (two) times daily.   0   clotrimazole (LOTRIMIN) 1 % cream Apply 1 application. topically 2 (two) times daily. 30 g 0   diphenoxylate-atropine (LOMOTIL) 2.5-0.025 MG tablet Take 1 tablet by mouth 4 (four) times daily as needed for diarrhea or loose stools. 60 tablet 1   escitalopram (LEXAPRO) 20 MG tablet Take 20 mg by mouth every morning.      levothyroxine (SYNTHROID) 125 MCG tablet TAKE 1 TABLET EVERY DAY ON EMPTY STOMACHWITH A GLASS OF WATER AT LEAST 30-60 MINBEFORE BREAKFAST 90 tablet 1   loperamide (IMODIUM) 2 MG capsule Take by mouth.     metoprolol tartrate (LOPRESSOR) 25 MG tablet TAKE 1 TABLET (25 MG TOTAL) BY MOUTH AS NEEDED (AS NEEDED UP TO TWICE DAILY FOR PALPITATIONS). 60 tablet 1   ondansetron (ZOFRAN) 4  MG tablet Take 4-8 mg by mouth every 8 (eight) hours as needed.     ondansetron (ZOFRAN) 8 MG tablet Take 1 tablet (8 mg total) by mouth 2 (two) times daily as needed for nausea or vomiting. 20 tablet 0   pantoprazole (PROTONIX) 40 MG tablet TAKE ONE TABLET (40 MG) BY MOUTH EVERY DAY 90 tablet 2   potassium chloride SA (KLOR-CON M) 20 MEQ tablet TAKE ONE (1) TABLET BY MOUTH TWO TIMES PER DAY 60 tablet 1   valACYclovir (VALTREX) 500 MG tablet Take 1 tablet (500  mg total) by mouth daily. 90 tablet 3   venetoclax (VENCLEXTA) 100 MG tablet Take 4 tablets (400 mg total) by mouth daily. Take for 14 days, then hold for 14 days. Repeat every 28 days. Take with a meal and a full glass of water. 56 tablet 1   Current Facility-Administered Medications  Medication Dose Route Frequency Provider Last Rate Last Admin   ondansetron (ZOFRAN) tablet 8 mg  8 mg Oral Once Guse, Jacquelynn Cree, FNP       Facility-Administered Medications Ordered in Other Visits  Medication Dose Route Frequency Provider Last Rate Last Admin   potassium chloride (KLOR-CON) CR tablet 40 mEq  40 mEq Oral BID Lloyd Huger, MD   40 mEq at 07/08/20 1136    VITAL SIGNS: BP (!) 144/73   Pulse 84   Temp (!) 97 F (36.1 C) (Tympanic)   Resp 16   SpO2 99%  There were no vitals filed for this visit.  Estimated body mass index is 21.97 kg/m as calculated from the following:   Height as of 12/26/21: 5' 4.5" (1.638 m).   Weight as of an earlier encounter on 01/12/22: 59 kg (130 lb).  LABS: CBC:    Component Value Date/Time   WBC 1.3 (LL) 01/12/2022 1305   HGB 12.0 01/12/2022 1305   HGB 12.5 05/09/2018 1147   HCT 33.8 (L) 01/12/2022 1305   HCT 36.8 05/09/2018 1147   PLT 214 01/12/2022 1305   PLT 285 05/09/2018 1147   MCV 88.3 01/12/2022 1305   MCV 86 05/09/2018 1147   MCV 89 11/30/2013 1419   NEUTROABS 0.7 (L) 01/12/2022 1305   NEUTROABS 2.9 11/20/2013 1404   LYMPHSABS 0.5 (L) 01/12/2022 1305   LYMPHSABS 0.9 (L) 11/20/2013 1404   MONOABS 0.1 01/12/2022 1305   MONOABS 0.2 11/20/2013 1404   EOSABS 0.0 01/12/2022 1305   EOSABS 0.1 11/20/2013 1404   BASOSABS 0.0 01/12/2022 1305   BASOSABS 0.0 11/20/2013 1404   Comprehensive Metabolic Panel:    Component Value Date/Time   NA 143 01/12/2022 1305   NA 143 05/09/2018 1147   NA 145 11/30/2013 1419   K 3.9 01/12/2022 1305   K 3.5 11/30/2013 1419   CL 109 01/12/2022 1305   CL 109 (H) 11/30/2013 1419   CO2 28 01/12/2022 1305    CO2 29 11/30/2013 1419   BUN 17 01/12/2022 1305   BUN 11 05/09/2018 1147   BUN 15 11/30/2013 1419   CREATININE 0.83 01/12/2022 1305   CREATININE 1.04 11/30/2013 1419   GLUCOSE 105 (H) 01/12/2022 1305   GLUCOSE 97 11/30/2013 1419   CALCIUM 8.9 01/12/2022 1305   CALCIUM 8.7 11/30/2013 1419   AST 15 12/15/2021 1328   AST 28 11/30/2013 1419   ALT 27 12/15/2021 1328   ALT 71 (H) 11/30/2013 1419   ALKPHOS 101 12/15/2021 1328   ALKPHOS 114 11/30/2013 1419   BILITOT 0.6 12/15/2021 1328  BILITOT 0.7 11/30/2013 1419   PROT 6.4 (L) 12/15/2021 1328   PROT 7.1 11/30/2013 1419   ALBUMIN 3.6 12/15/2021 1328   ALBUMIN 3.3 (L) 11/30/2013 1419     Present during today's visit: patient only  Assessment and Plan: Continue venetoclax 457m 14on/14 off (patient was spliting this up to be 2071min AM and 20079mn PM, I asked her to begin taking this as 400m16mce daily) Reviewed CBC, ANC has decrease to 0.7, therapy can continue, will continue to monitor.   Oral Chemotherapy Side Effect/Intolerance:  Diarrhea: stable, patient is able to manage her diarrhea with loperamide as needed.  Fatigue: unchanged, will continue to monitor No reported nausea, edema, rash, muscle pain, mouth sores.  Other:  Patient reported issues with her seasonal allergies which were adding to her fatigue, she is currently only using Dymista nasal spray for the her allergies.  Recommended she switch to Allegra as her antihistamine, stop the Dymista, and then start Flonase OTC nasal sprays  Oral Chemotherapy Adherence: no missed doses reported No patient barriers to medication adherence identified.   New medications: none reported  Medication Access Issues: no issues, [facility] expressed understanding and was in agreement with this plan. She also understands that She can call clinic at any time with any questions, concerns, or complaints.   Follow-up plan: RTC for provider visit  in 4 weeks  Thank you for allowing me to participate in the care of this very pleasant patient.   Time Total: 15 mins  Visit consisted of counseling and education on dealing with issues of symptom management in the setting of serious and potentially life-threatening illness.Greater than 50%  of this time was spent counseling and coordinating care related to the above assessment and plan.  Signed by: AlysDarl PikesarmD, BCPS, BCOPSalley SlaughterP Hematology/Oncology Clinical Pharmacist Practitioner Junior/DB/AP Oral ChemValley City [phone]/2023 3:42 PM

## 2022-01-13 ENCOUNTER — Encounter: Payer: Self-pay | Admitting: Oncology

## 2022-01-13 ENCOUNTER — Inpatient Hospital Stay (HOSPITAL_BASED_OUTPATIENT_CLINIC_OR_DEPARTMENT_OTHER): Payer: PPO | Admitting: Oncology

## 2022-01-13 ENCOUNTER — Inpatient Hospital Stay: Payer: PPO

## 2022-01-13 VITALS — BP 121/69 | HR 81 | Temp 98.9°F | Ht 64.5 in | Wt 130.0 lb

## 2022-01-13 DIAGNOSIS — C9200 Acute myeloblastic leukemia, not having achieved remission: Secondary | ICD-10-CM

## 2022-01-13 DIAGNOSIS — Z5111 Encounter for antineoplastic chemotherapy: Secondary | ICD-10-CM | POA: Diagnosis not present

## 2022-01-13 MED ORDER — ONDANSETRON HCL 4 MG PO TABS
8.0000 mg | ORAL_TABLET | Freq: Once | ORAL | Status: AC
  Administered 2022-01-13: 8 mg via ORAL
  Filled 2022-01-13: qty 2

## 2022-01-13 MED ORDER — AZACITIDINE CHEMO SQ INJECTION
50.0000 mg/m2 | Freq: Once | INTRAMUSCULAR | Status: AC
  Administered 2022-01-13: 82.5 mg via SUBCUTANEOUS
  Filled 2022-01-13: qty 3.3

## 2022-01-13 NOTE — Patient Instructions (Signed)
[facility] [place]  Discharge Instructions: Thank you for choosing [facility] to provide your oncology and hematology care.  If you have a lab appointment with the Cancer Center, please go directly to the Cancer Center and check in at the registration area.  Wear comfortable clothing and clothing appropriate for easy access to any Portacath or PICC line.   We strive to give you quality time with your provider. You may need to reschedule your appointment if you arrive late (15 or more minutes).  Arriving late affects you and other patients whose appointments are after yours.  Also, if you miss three or more appointments without notifying the office, you may be dismissed from the clinic at the provider's discretion.      For prescription refill requests, have your pharmacy contact our office and allow 72 hours for refills to be completed.    Today you received the following chemotherapy and/or immunotherapy agents Vidaza      To help prevent nausea and vomiting after your treatment, we encourage you to take your nausea medication as directed.  BELOW ARE SYMPTOMS THAT SHOULD BE REPORTED IMMEDIATELY: *FEVER GREATER THAN 100.4 F (38 C) OR HIGHER *CHILLS OR SWEATING *NAUSEA AND VOMITING THAT IS NOT CONTROLLED WITH YOUR NAUSEA MEDICATION *UNUSUAL SHORTNESS OF BREATH *UNUSUAL BRUISING OR BLEEDING *URINARY PROBLEMS (pain or burning when urinating, or frequent urination) *BOWEL PROBLEMS (unusual diarrhea, constipation, pain near the anus) TENDERNESS IN MOUTH AND THROAT WITH OR WITHOUT PRESENCE OF ULCERS (sore throat, sores in mouth, or a toothache) UNUSUAL RASH, SWELLING OR PAIN  UNUSUAL VAGINAL DISCHARGE OR ITCHING   Items with * indicate a potential emergency and should be followed up as soon as possible or go to the Emergency Department if any problems should occur.  Please show the CHEMOTHERAPY ALERT CARD or IMMUNOTHERAPY ALERT CARD at check-in to the  Emergency Department and triage nurse.  Should you have questions after your visit or need to cancel or reschedule your appointment, please contact [facility] [place]  [phone] and follow the prompts.  Office hours are 8:00 a.m. to 4:30 p.m. Monday - Friday. Please note that voicemails left after 4:00 p.m. may not be returned until the following business day.  We are closed weekends and major holidays. You have access to a nurse at all times for urgent questions. Please call the main number to the clinic [phone] and follow the prompts.  For any non-urgent questions, you may also contact your provider using MyChart. We now offer e-Visits for anyone 18 and older to request care online for non-urgent symptoms. For details visit [email or web address].   Also download the MyChart app! Go to the app store, search "MyChart", open the app, select [place], and log in with your MyChart username and password.  Masks are optional in the cancer centers. If you would like for your care team to wear a mask while they are taking care of you, please let them know. For doctor visits, patients may have with them one support person who is at least 72 years old. At this time, visitors are not allowed in the infusion area.   

## 2022-01-14 ENCOUNTER — Inpatient Hospital Stay: Payer: PPO

## 2022-01-14 VITALS — BP 137/69 | HR 75 | Temp 98.1°F | Resp 20

## 2022-01-14 DIAGNOSIS — Z5111 Encounter for antineoplastic chemotherapy: Secondary | ICD-10-CM | POA: Diagnosis not present

## 2022-01-14 DIAGNOSIS — C9200 Acute myeloblastic leukemia, not having achieved remission: Secondary | ICD-10-CM

## 2022-01-14 MED ORDER — ONDANSETRON HCL 4 MG PO TABS: 8.0000 mg | ORAL_TABLET | Freq: Once | ORAL | Status: DC

## 2022-01-14 MED ORDER — AZACITIDINE CHEMO SQ INJECTION
50.0000 mg/m2 | Freq: Once | INTRAMUSCULAR | Status: AC
  Administered 2022-01-14: 82.5 mg via SUBCUTANEOUS
  Filled 2022-01-14: qty 3.3

## 2022-01-14 MED ORDER — ONDANSETRON 8 MG PO TBDP
8.0000 mg | ORAL_TABLET | Freq: Once | ORAL | Status: AC
  Administered 2022-01-14: 8 mg via ORAL
  Filled 2022-01-14: qty 1

## 2022-01-15 ENCOUNTER — Inpatient Hospital Stay: Payer: PPO

## 2022-01-15 VITALS — BP 143/70 | HR 72 | Temp 97.9°F | Resp 16

## 2022-01-15 DIAGNOSIS — C9200 Acute myeloblastic leukemia, not having achieved remission: Secondary | ICD-10-CM

## 2022-01-15 DIAGNOSIS — Z5111 Encounter for antineoplastic chemotherapy: Secondary | ICD-10-CM | POA: Diagnosis not present

## 2022-01-15 MED ORDER — ONDANSETRON 8 MG PO TBDP
8.0000 mg | ORAL_TABLET | Freq: Once | ORAL | Status: AC
  Administered 2022-01-15: 8 mg via ORAL
  Filled 2022-01-15: qty 1

## 2022-01-15 MED ORDER — AZACITIDINE CHEMO SQ INJECTION
50.0000 mg/m2 | Freq: Once | INTRAMUSCULAR | Status: AC
  Administered 2022-01-15: 82.5 mg via SUBCUTANEOUS
  Filled 2022-01-15: qty 3.3

## 2022-01-15 MED ORDER — ONDANSETRON HCL 4 MG PO TABS: 8.0000 mg | ORAL_TABLET | Freq: Once | ORAL | Status: DC

## 2022-01-15 NOTE — Patient Instructions (Signed)
[facility] [place]  Discharge Instructions: Thank you for choosing [facility] to provide your oncology and hematology care.  If you have a lab appointment with the Cancer Center, please go directly to the Cancer Center and check in at the registration area.  Wear comfortable clothing and clothing appropriate for easy access to any Portacath or PICC line.   We strive to give you quality time with your provider. You may need to reschedule your appointment if you arrive late (15 or more minutes).  Arriving late affects you and other patients whose appointments are after yours.  Also, if you miss three or more appointments without notifying the office, you may be dismissed from the clinic at the provider's discretion.      For prescription refill requests, have your pharmacy contact our office and allow 72 hours for refills to be completed.    Today you received the following chemotherapy and/or immunotherapy agents Vidaza      To help prevent nausea and vomiting after your treatment, we encourage you to take your nausea medication as directed.  BELOW ARE SYMPTOMS THAT SHOULD BE REPORTED IMMEDIATELY: *FEVER GREATER THAN 100.4 F (38 C) OR HIGHER *CHILLS OR SWEATING *NAUSEA AND VOMITING THAT IS NOT CONTROLLED WITH YOUR NAUSEA MEDICATION *UNUSUAL SHORTNESS OF BREATH *UNUSUAL BRUISING OR BLEEDING *URINARY PROBLEMS (pain or burning when urinating, or frequent urination) *BOWEL PROBLEMS (unusual diarrhea, constipation, pain near the anus) TENDERNESS IN MOUTH AND THROAT WITH OR WITHOUT PRESENCE OF ULCERS (sore throat, sores in mouth, or a toothache) UNUSUAL RASH, SWELLING OR PAIN  UNUSUAL VAGINAL DISCHARGE OR ITCHING   Items with * indicate a potential emergency and should be followed up as soon as possible or go to the Emergency Department if any problems should occur.  Please show the CHEMOTHERAPY ALERT CARD or IMMUNOTHERAPY ALERT CARD at check-in to the  Emergency Department and triage nurse.  Should you have questions after your visit or need to cancel or reschedule your appointment, please contact [facility] [place]  [phone] and follow the prompts.  Office hours are 8:00 a.m. to 4:30 p.m. Monday - Friday. Please note that voicemails left after 4:00 p.m. may not be returned until the following business day.  We are closed weekends and major holidays. You have access to a nurse at all times for urgent questions. Please call the main number to the clinic [phone] and follow the prompts.  For any non-urgent questions, you may also contact your provider using MyChart. We now offer e-Visits for anyone 18 and older to request care online for non-urgent symptoms. For details visit [email or web address].   Also download the MyChart app! Go to the app store, search "MyChart", open the app, select [place], and log in with your MyChart username and password.  Masks are optional in the cancer centers. If you would like for your care team to wear a mask while they are taking care of you, please let them know. For doctor visits, patients may have with them one support person who is at least 72 years old. At this time, visitors are not allowed in the infusion area.   

## 2022-01-16 ENCOUNTER — Inpatient Hospital Stay: Payer: PPO

## 2022-01-16 VITALS — BP 135/77 | HR 71 | Temp 97.5°F | Resp 16

## 2022-01-16 DIAGNOSIS — C9200 Acute myeloblastic leukemia, not having achieved remission: Secondary | ICD-10-CM

## 2022-01-16 DIAGNOSIS — Z5111 Encounter for antineoplastic chemotherapy: Secondary | ICD-10-CM | POA: Diagnosis not present

## 2022-01-16 MED ORDER — ONDANSETRON HCL 4 MG PO TABS: 8.0000 mg | ORAL_TABLET | Freq: Once | ORAL | Status: DC

## 2022-01-16 MED ORDER — ONDANSETRON 8 MG PO TBDP
8.0000 mg | ORAL_TABLET | Freq: Once | ORAL | Status: AC
  Administered 2022-01-16: 8 mg via ORAL
  Filled 2022-01-16: qty 1

## 2022-01-16 MED ORDER — AZACITIDINE CHEMO SQ INJECTION
50.0000 mg/m2 | Freq: Once | INTRAMUSCULAR | Status: AC
  Administered 2022-01-16: 82.5 mg via SUBCUTANEOUS
  Filled 2022-01-16: qty 3.3

## 2022-01-16 NOTE — Patient Instructions (Signed)
[facility] [place]  Discharge Instructions: Thank you for choosing [facility] to provide your oncology and hematology care.  If you have a lab appointment with the Cancer Center, please go directly to the Cancer Center and check in at the registration area.  Wear comfortable clothing and clothing appropriate for easy access to any Portacath or PICC line.   We strive to give you quality time with your provider. You may need to reschedule your appointment if you arrive late (15 or more minutes).  Arriving late affects you and other patients whose appointments are after yours.  Also, if you miss three or more appointments without notifying the office, you may be dismissed from the clinic at the provider's discretion.      For prescription refill requests, have your pharmacy contact our office and allow 72 hours for refills to be completed.    Today you received the following chemotherapy and/or immunotherapy agents Vidaza      To help prevent nausea and vomiting after your treatment, we encourage you to take your nausea medication as directed.  BELOW ARE SYMPTOMS THAT SHOULD BE REPORTED IMMEDIATELY: *FEVER GREATER THAN 100.4 F (38 C) OR HIGHER *CHILLS OR SWEATING *NAUSEA AND VOMITING THAT IS NOT CONTROLLED WITH YOUR NAUSEA MEDICATION *UNUSUAL SHORTNESS OF BREATH *UNUSUAL BRUISING OR BLEEDING *URINARY PROBLEMS (pain or burning when urinating, or frequent urination) *BOWEL PROBLEMS (unusual diarrhea, constipation, pain near the anus) TENDERNESS IN MOUTH AND THROAT WITH OR WITHOUT PRESENCE OF ULCERS (sore throat, sores in mouth, or a toothache) UNUSUAL RASH, SWELLING OR PAIN  UNUSUAL VAGINAL DISCHARGE OR ITCHING   Items with * indicate a potential emergency and should be followed up as soon as possible or go to the Emergency Department if any problems should occur.  Please show the CHEMOTHERAPY ALERT CARD or IMMUNOTHERAPY ALERT CARD at check-in to the  Emergency Department and triage nurse.  Should you have questions after your visit or need to cancel or reschedule your appointment, please contact [facility] [place]  [phone] and follow the prompts.  Office hours are 8:00 a.m. to 4:30 p.m. Monday - Friday. Please note that voicemails left after 4:00 p.m. may not be returned until the following business day.  We are closed weekends and major holidays. You have access to a nurse at all times for urgent questions. Please call the main number to the clinic [phone] and follow the prompts.  For any non-urgent questions, you may also contact your provider using MyChart. We now offer e-Visits for anyone 18 and older to request care online for non-urgent symptoms. For details visit [email or web address].   Also download the MyChart app! Go to the app store, search "MyChart", open the app, select [facility], and log in with your MyChart username and password.  Masks are optional in the cancer centers. If you would like for your care team to wear a mask while they are taking care of you, please let them know. For doctor visits, patients may have with them one support person who is at least 72 years old. At this time, visitors are not allowed in the infusion area.   

## 2022-01-30 ENCOUNTER — Other Ambulatory Visit (HOSPITAL_COMMUNITY): Payer: Self-pay

## 2022-02-02 ENCOUNTER — Other Ambulatory Visit (HOSPITAL_COMMUNITY): Payer: Self-pay

## 2022-02-02 ENCOUNTER — Other Ambulatory Visit: Payer: Self-pay | Admitting: Pharmacist

## 2022-02-02 DIAGNOSIS — C9200 Acute myeloblastic leukemia, not having achieved remission: Secondary | ICD-10-CM

## 2022-02-02 MED ORDER — VENETOCLAX 100 MG PO TABS
400.0000 mg | ORAL_TABLET | Freq: Every day | ORAL | 1 refills | Status: DC
  Filled 2022-02-02: qty 56, 14d supply, fill #0
  Filled 2022-02-26: qty 56, 28d supply, fill #1

## 2022-02-05 ENCOUNTER — Other Ambulatory Visit (HOSPITAL_COMMUNITY): Payer: Self-pay

## 2022-02-05 ENCOUNTER — Ambulatory Visit
Admit: 2022-02-05 | Discharge: 2022-02-06 | Payer: PRIVATE HEALTH INSURANCE | Attending: Adult Health | Primary: Adult Health

## 2022-02-05 ENCOUNTER — Ambulatory Visit: Admit: 2022-02-05 | Discharge: 2022-02-06 | Payer: PRIVATE HEALTH INSURANCE

## 2022-02-05 DIAGNOSIS — C9201 Acute myeloblastic leukemia, in remission: Secondary | ICD-10-CM | POA: Diagnosis not present

## 2022-02-05 MED ORDER — LEVOFLOXACIN 500 MG TABLET
ORAL_TABLET | Freq: Every day | ORAL | 0 refills | 7 days | Status: CP
Start: 2022-02-05 — End: 2022-02-12

## 2022-02-06 NOTE — Progress Notes (Signed)
[facility]  Telephone:(336) [phone] Fax:(336) [phone]  ID: [name] OB: [birth year]  MR#: [id number]  CSN#:[id number]  Patient Care Team: [name], MD as PCP - General (Internal Medicine) Wellington Hampshire, MD as PCP - Cardiology (Cardiology) Lloyd Huger, MD as Consulting Physician (Oncology)  CHIEF COMPLAINT: AML.  INTERVAL HISTORY: Patient returns to clinic today for further evaluation and continuation of cycle 22 of Vidaza and venetoclax.  She was recently seen at [facility] who initiated Levaquin for an [place] less than 0.5, but this has improved.  She currently feels well.  She does not complain of diarrhea today.  She continues to be highly anxious.  She denies any pain.  She has no neurologic complaints.  She denies any recent fevers or illnesses. She has no chest pain, shortness of breath, cough, or hemoptysis.  She denies any nausea or vomiting.  She has no urinary complaints.  Patient offers no specific complaints today.  REVIEW OF SYSTEMS:   Review of Systems  Constitutional:  Positive for malaise/fatigue. Negative for fever and weight loss.  Respiratory: Negative.  Negative for cough, hemoptysis and shortness of breath.   Cardiovascular: Negative.  Negative for chest pain and leg swelling.  Gastrointestinal: Negative.  Negative for abdominal pain and diarrhea.  Genitourinary: Negative.  Negative for dysuria.  Musculoskeletal: Negative.  Negative for back pain and falls.  Skin: Negative.  Negative for rash.  Neurological:  Positive for weakness. Negative for dizziness, focal weakness and headaches.  Psychiatric/Behavioral:  Negative for memory loss. The patient is nervous/anxious.     As per HPI. Otherwise, a complete review of systems is negative.  PAST MEDICAL HISTORY: Past Medical History:  Diagnosis Date   Anxiety    Arthritis    Osteoarthritis   Breast cancer ([place]) 2009   right breast lumpectomy with rad tx   Colon cancer ([place])     surgery with chemo and rad tx   Complication of anesthesia    GERD (gastroesophageal reflux disease)    Heart palpitations    History of hiatal hernia    History of kidney stones    Hypothyroidism    Liver disease    Liver nodule    s/p negative biopsy   Malignant neoplasm of thyroid gland ([place]) 2002   s/p surgery and XRT   Osteoporosis    Other and unspecified hyperlipidemia    Palpitations    Personal history of chemotherapy    Personal history of malignant neoplasm of large intestine    carcinoma - cecum, s/p right laparoscopic colectomy - s/p chemotherapy and XRT   Personal history of radiation therapy    Pneumonia 2019   PONV (postoperative nausea and vomiting)    Pure hypercholesterolemia    Unspecified hereditary and idiopathic peripheral neuropathy     PAST SURGICAL HISTORY: Past Surgical History:  Procedure Laterality Date   APPENDECTOMY  1985   BREAST BIOPSY Right 2009   positive   BREAST BIOPSY Right 2009   negative   BREAST LUMPECTOMY Right 2009   breast cancer   CHOLECYSTECTOMY  1995   COLONOSCOPY     COLONOSCOPY WITH PROPOFOL N/A 10/29/2017   Procedure: COLONOSCOPY WITH PROPOFOL;  Surgeon: Manya Silvas, MD;  Location[facility] END[facility]COPY;  Service: Endoscopy;  Laterality: N/A;   DILATION AND CURETTAGE OF UTERUS  1990   DILATION AND CURETTAGE, DIAGNOSTIC / THERAPEUTIC  1990   ESOPHAGOGASTRODUODENOSCOPY     ESOPHAGOGASTRODUODENOSCOPY (EGD) WITH PROPOFOL N/A 10/29/2017   Procedure:  ESOPHAGOGASTRODUODENOSCOPY (EGD) WITH PROPOFOL;  Surgeon: Manya Silvas, MD;  Location: [facility] ENDOSCOPY;  Service: Endoscopy;  Laterality: N/A;   INCISION AND DRAINAGE PERIRECTAL ABSCESS N/A 06/17/2020   Procedure: IRRIGATION AND DEBRIDEMENT PERIRECTAL ABSCESS;  Surgeon: Jules Husbands, MD;  Location: [facility] ORS;  Service: General;  Laterality: N/A;   IR BONE MARROW BIOPSY & ASPIRATION  07/09/2021   LAPAROSCOPIC PARTIAL COLECTOMY     stage 3-C carcinoma of the cecum, s/p chemotherapy  and xrt   LITHOTRIPSY     SIGMOIDOSCOPY  08/26/1993   THYROID LOBECTOMY  2002   s/p XRT   TOTAL HIP ARTHROPLASTY Right 10/24/2019   Procedure: TOTAL HIP ARTHROPLASTY;  Surgeon: Corky Mull, MD;  Location: [facility] ORS;  Service: Orthopedics;  Laterality: Right;    FAMILY HISTORY: Family History  Problem Relation Age of Onset   Stroke Mother        30s   Alzheimer's disease Mother    Lung cancer Father    Prostate cancer Father    Cancer Father        Colon   Colon cancer Father    Breast cancer Sister        77's   Lung cancer Sister    Breast cancer Maternal Aunt     ADVANCED DIRECTIVES (Y/N):  N  HEALTH MAINTENANCE: Social History   Tobacco Use   Smoking status: Never   Smokeless tobacco: Never  Vaping Use   Vaping Use: Never used  Substance Use Topics   Alcohol use: No    Alcohol/week: 0.0 standard drinks of alcohol   Drug use: No     Colonoscopy:  PAP:  Bone density:  Lipid panel:  Allergies  Allergen Reactions   Demeclocycline Other (See Comments)    Throat swells   Sulfa Antibiotics Other (See Comments)    Other reaction(s): Other (See Comments) Throat swells Other reaction(s): Other (See Comments) Throat swells Throat swells Other reaction(s): Other (See Comments) Throat swells Other reaction(s): Unknown Other reaction(s): Other (See Comments) Throat swells Other reaction(s): Other (See Comments) Other reaction(s): Other (See Comments) Throat swellsOther reaction(s): Other (See Comments) Throat swells   Tetracyclines & Related Other (See Comments)    Throat swells    Augmentin [Amoxicillin-Pot Clavulanate] Diarrhea   Bentyl [Dicyclomine Hcl]     unkn   Ciprofloxacin Diarrhea   Codeine Other (See Comments)    dizziness     Dicyclomine Hcl Other (See Comments)    unkn   Epinephrine     Speeds heart up - panic    Flagyl [Metronidazole] Nausea And Vomiting   Librax [Chlordiazepoxide-Clidinium]     unkn   Novocain [Procaine] Other  (See Comments)    "Shaky"   Phenobarbital     unkn   Prednisone     Nervous     Ultram [Tramadol] Other (See Comments)    Sick feeling    Current Outpatient Medications  Medication Sig Dispense Refill   Azelastine-Fluticasone 137-50 MCG/ACT SUSP SPRAY 2 SPRAYS INTO EACH NOSTRIL EVERY DAY 23 g 3   clonazePAM (KLONOPIN) 0.5 MG tablet Take 1.5 mg by mouth 2 (two) times daily.   0   clotrimazole (LOTRIMIN) 1 % cream Apply 1 application. topically 2 (two) times daily. 30 g 0   diphenoxylate-atropine (LOMOTIL) 2.5-0.025 MG tablet Take 1 tablet by mouth 4 (four) times daily as needed for diarrhea or loose stools. 60 tablet 1   escitalopram (LEXAPRO) 20 MG tablet Take 20 mg by mouth every  morning.      loperamide (IMODIUM) 2 MG capsule Take by mouth.     metoprolol tartrate (LOPRESSOR) 25 MG tablet TAKE 1 TABLET (25 MG TOTAL) BY MOUTH AS NEEDED (AS NEEDED UP TO TWICE DAILY FOR PALPITATIONS). 60 tablet 1   ondansetron (ZOFRAN) 4 MG tablet Take 4-8 mg by mouth every 8 (eight) hours as needed.     ondansetron (ZOFRAN) 8 MG tablet Take 1 tablet (8 mg total) by mouth 2 (two) times daily as needed for nausea or vomiting. 20 tablet 0   pantoprazole (PROTONIX) 40 MG tablet TAKE ONE TABLET (40 MG) BY MOUTH EVERY DAY 90 tablet 2   potassium chloride SA (KLOR-CON M) 20 MEQ tablet TAKE ONE (1) TABLET BY MOUTH TWO TIMES PER DAY 60 tablet 1   valACYclovir (VALTREX) 500 MG tablet Take 1 tablet (500 mg total) by mouth daily. 90 tablet 3   venetoclax (VENCLEXTA) 100 MG tablet Take 4 tablets (400 mg total) by mouth daily. Take for 14 days, then hold for 14 days. Repeat every 28 days. Take with a meal and a full glass of water. 56 tablet 1   levothyroxine (SYNTHROID) 125 MCG tablet TAKE 1 TABLET EVERY DAY ON EMPTY STOMACHWITH A GLASS OF WATER AT LEAST 30-60 MINBEFORE BREAKFAST 90 tablet 1   Current Facility-Administered Medications  Medication Dose Route Frequency Provider Last Rate Last Admin   ondansetron  (ZOFRAN) tablet 8 mg  8 mg Oral Once Guse, Jacquelynn Cree, FNP       Facility-Administered Medications Ordered in Other Visits  Medication Dose Route Frequency Provider Last Rate Last Admin   potassium chloride (KLOR-CON) CR tablet 40 mEq  40 mEq Oral BID Lloyd Huger, MD   40 mEq at 07/08/20 1136    OBJECTIVE: Vitals:   02/10/22 1324  BP: 127/73  Pulse: 92  Temp: 99.3 F (37.4 C)  SpO2: 98%     Body mass index is 22.31 kg/m.    ECOG FS:0 - Asymptomatic  General: Well-developed, well-nourished, no acute distress. Eyes: Pink conjunctiva, anicteric sclera. HEENT: Normocephalic, moist mucous membranes. Lungs: No audible wheezing or coughing. Heart: Regular rate and rhythm. Abdomen: Soft, nontender, no obvious distention. Musculoskeletal: No edema, cyanosis, or clubbing. Neuro: Alert, answering all questions appropriately. Cranial nerves grossly intact. Skin: No rashes or petechiae noted. Psych: Normal affect.   LAB RESULTS:  Lab Results  Component Value Date   NA 142 02/09/2022   K 3.4 (L) 02/09/2022   CL 109 02/09/2022   CO2 27 02/09/2022   GLUCOSE 106 (H) 02/09/2022   BUN 13 02/09/2022   CREATININE 0.78 02/09/2022   CALCIUM 8.6 (L) 02/09/2022   PROT 6.4 (L) 12/15/2021   ALBUMIN 3.6 12/15/2021   AST 15 12/15/2021   ALT 27 12/15/2021   ALKPHOS 101 12/15/2021   BILITOT 0.6 12/15/2021   GFRNONAA >60 02/09/2022   GFRAA 54 (L) 10/26/2019    Lab Results  Component Value Date   WBC 1.4 (LL) 02/09/2022   NEUTROABS 0.7 (L) 02/09/2022   HGB 11.2 (L) 02/09/2022   HCT 32.0 (L) 02/09/2022   MCV 87.9 02/09/2022   PLT 194 02/09/2022     STUDIES: No results found.   ASSESSMENT: AML.  PLAN:    1.  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  Patient completed cycle 1 of induction therapy at  [facility] with Vidaza and  venetoclax.  She only received 5 days of Vidaza for cycle 2 recently which was interrupted secondary to significant diarrhea and a perirectal abscess.  Her most recent bone marrow biopsy on July 09, 2021 revealed an increased blast count to 5% suggesting slight progression of disease.  Case discussed with [facility] at [facility].  Given her IDH-1 mutation, she may also benefit from an IDH inhibitor in the future.  Will continue maintenance treatment with Vidaza on days 1 through 5 every 28 days along with venetoclax 14 days on, 14 days off.  Proceed with treatment as scheduled this week.  Return to clinic in 4 weeks for further evaluation and consideration of cycle 23.   2.  Neutropenia: Improved.  Patient has been instructed to discontinue Levaquin. 3.  Thrombocytopenia: Resolved. 4.  Perirectal abscess: Resolved.   5.  Diarrhea: Appears to have resolved.  Continue Lomotil as needed.   6.  Supportive care: Patient previously was given referrals to home health and home palliative care.  Unclear if these services are still active.  Appreciate palliative care input.   7.  Hypokalemia: Mild.  Continue oral potassium supplementation.  8.  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  Patient also received chemotherapy and likely received Adriamycin, but exact regimen is unknown.  She completed 5 years of hormonal therapy in approximately June 2015. Currently, she has no evidence of disease.  Her most recent mammogram on July 29, 2021 was reported as BI-RADS 2.  Repeat in March 2024. 9.  History of colon cancer: Patient also states that she received chemotherapy for this, possibly FOLFOX but again this is unknown. 10.  Restlessness: Patient does not complain of this today.  I spent a total of 30 minutes reviewing chart data, face-to-face  evaluation with the patient, counseling and coordination of care as detailed above.   Patient expressed understanding and was in agreement with this plan. She also understands that She can call clinic at any time with any questions, concerns, or complaints.    Cancer Staging  History of breast cancer Staging form: Breast, AJCC 7th Edition - Clinical stage from 01/07/2016: Stage IA (T1c, N0, M0) - Signed by Lloyd Huger, MD on 01/07/2016 Laterality: Right Estrogen receptor status: Positive Progesterone receptor status: Positive HER2 status: Negative   Lloyd Huger, MD   02/11/2022 6:25 AM

## 2022-02-09 ENCOUNTER — Inpatient Hospital Stay: Payer: PPO | Attending: Oncology

## 2022-02-09 ENCOUNTER — Inpatient Hospital Stay: Payer: PPO

## 2022-02-09 VITALS — BP 130/71 | HR 78 | Temp 98.7°F | Resp 18

## 2022-02-09 DIAGNOSIS — C9200 Acute myeloblastic leukemia, not having achieved remission: Secondary | ICD-10-CM | POA: Diagnosis not present

## 2022-02-09 DIAGNOSIS — Z5111 Encounter for antineoplastic chemotherapy: Secondary | ICD-10-CM | POA: Diagnosis not present

## 2022-02-09 LAB — BASIC METABOLIC PANEL
Anion gap: 6 (ref 5–15)
BUN: 13 mg/dL (ref 8–23)
CO2: 27 mmol/L (ref 22–32)
Calcium: 8.6 mg/dL — ABNORMAL LOW (ref 8.9–10.3)
Chloride: 109 mmol/L (ref 98–111)
Creatinine, Ser: 0.78 mg/dL (ref 0.44–1.00)
GFR, Estimated: >60 mL/min (ref >60)
Glucose, Bld: 106 mg/dL — ABNORMAL HIGH (ref 70–99)
Potassium: 3.4 mmol/L — ABNORMAL LOW (ref 3.5–5.1)
Sodium: 142 mmol/L (ref 135–145)

## 2022-02-09 LAB — CBC WITH DIFFERENTIAL/PLATELET
Abs Immature Granulocytes: 0 10*3/uL (ref 0.00–0.07)
Basophils Absolute: 0 10*3/uL (ref 0.0–0.1)
Basophils Relative: 1 %
Eosinophils Absolute: 0 10*3/uL (ref 0.0–0.5)
Eosinophils Relative: 0 %
HCT: 32 % — ABNORMAL LOW (ref 36.0–46.0)
Hemoglobin: 11.2 g/dL — ABNORMAL LOW (ref 12.0–15.0)
Immature Granulocytes: 0 %
Lymphocytes Relative: 35 %
Lymphs Abs: 0.5 10*3/uL — ABNORMAL LOW (ref 0.7–4.0)
MCH: 30.8 pg (ref 26.0–34.0)
MCHC: 35 g/dL (ref 30.0–36.0)
MCV: 87.9 fL (ref 80.0–100.0)
Monocytes Absolute: 0.2 10*3/uL (ref 0.1–1.0)
Monocytes Relative: 13 %
Neutro Abs: 0.7 10*3/uL — ABNORMAL LOW (ref 1.7–7.7)
Neutrophils Relative %: 51 %
Platelets: 194 10*3/uL (ref 150–400)
RBC: 3.64 MIL/uL — ABNORMAL LOW (ref 3.87–5.11)
RDW: 15.6 % — ABNORMAL HIGH (ref 11.5–15.5)
WBC: 1.4 10*3/uL — CL (ref 4.0–10.5)
nRBC: 0 % (ref 0.0–0.2)

## 2022-02-09 MED ORDER — AZACITIDINE CHEMO SQ INJECTION
50.0000 mg/m2 | Freq: Once | INTRAMUSCULAR | Status: AC
  Administered 2022-02-09: 82.5 mg via SUBCUTANEOUS
  Filled 2022-02-09: qty 3.3

## 2022-02-09 MED ORDER — ONDANSETRON 8 MG PO TBDP
8.0000 mg | ORAL_TABLET | Freq: Once | ORAL | Status: AC
  Administered 2022-02-09: 8 mg via ORAL
  Filled 2022-02-09: qty 1

## 2022-02-09 MED ORDER — ONDANSETRON HCL 8 MG PO TABS: 8.0000 mg | ORAL_TABLET | Freq: Once | ORAL | Status: DC

## 2022-02-09 NOTE — Patient Instructions (Signed)
[facility] CANCER CTR AT [place]  Discharge Instructions: Thank you for choosing [facility] to provide your oncology and hematology care.  If you have a lab appointment with the [facility], please go directly to the [facility] and check in at the registration area.  Wear comfortable clothing and clothing appropriate for easy access to any Portacath or PICC line.   We strive to give you quality time with your provider. You may need to reschedule your appointment if you arrive late (15 or more minutes).  Arriving late affects you and other patients whose appointments are after yours.  Also, if you miss three or more appointments without notifying the office, you may be dismissed from the clinic at the provider's discretion.      For prescription refill requests, have your pharmacy contact our office and allow 72 hours for refills to be completed.    Today you received the following chemotherapy and/or immunotherapy agents Azacitidine.      To help prevent nausea and vomiting after your treatment, we encourage you to take your nausea medication as directed.  BELOW ARE SYMPTOMS THAT SHOULD BE REPORTED IMMEDIATELY: *FEVER GREATER THAN 100.4 F (38 C) OR HIGHER *CHILLS OR SWEATING *NAUSEA AND VOMITING THAT IS NOT CONTROLLED WITH YOUR NAUSEA MEDICATION *UNUSUAL SHORTNESS OF BREATH *UNUSUAL BRUISING OR BLEEDING *URINARY PROBLEMS (pain or burning when urinating, or frequent urination) *BOWEL PROBLEMS (unusual diarrhea, constipation, pain near the anus) TENDERNESS IN MOUTH AND THROAT WITH OR WITHOUT PRESENCE OF ULCERS (sore throat, sores in mouth, or a toothache) UNUSUAL RASH, SWELLING OR PAIN  UNUSUAL VAGINAL DISCHARGE OR ITCHING   Items with * indicate a potential emergency and should be followed up as soon as possible or go to the Emergency Department if any problems should occur.  Please show the CHEMOTHERAPY ALERT CARD or IMMUNOTHERAPY ALERT CARD at check-in  to the Emergency Department and triage nurse.  Should you have questions after your visit or need to cancel or reschedule your appointment, please contact [facility] CANCER [place] AT [place]  [phone] and follow the prompts.  Office hours are 8:00 a.m. to 4:30 p.m. Monday - Friday. Please note that voicemails left after 4:00 p.m. may not be returned until the following business day.  We are closed weekends and major holidays. You have access to a nurse at all times for urgent questions. Please call the main number to the clinic [phone] and follow the prompts.  For any non-urgent questions, you may also contact your provider using MyChart. We now offer e-Visits for anyone 26 and older to request care online for non-urgent symptoms. For details visit [email or web address].   Also download the MyChart app! Go to the app store, search "MyChart", open the app, select [place], and log in with your MyChart username and password.  Masks are optional in the cancer centers. If you would like for your care team to wear a mask while they are taking care of you, please let them know. For doctor visits, patients may have with them one support person who is at least 72 years old. At this time, visitors are not allowed in the infusion area.

## 2022-02-10 ENCOUNTER — Encounter: Payer: Self-pay | Admitting: Oncology

## 2022-02-10 ENCOUNTER — Inpatient Hospital Stay: Payer: PPO

## 2022-02-10 ENCOUNTER — Other Ambulatory Visit: Payer: Self-pay | Admitting: Internal Medicine

## 2022-02-10 ENCOUNTER — Ambulatory Visit: Payer: PPO

## 2022-02-10 ENCOUNTER — Inpatient Hospital Stay (HOSPITAL_BASED_OUTPATIENT_CLINIC_OR_DEPARTMENT_OTHER): Payer: PPO | Admitting: Oncology

## 2022-02-10 ENCOUNTER — Ambulatory Visit: Payer: PPO | Admitting: Oncology

## 2022-02-10 VITALS — BP 127/73 | HR 92 | Temp 99.3°F | Wt 132.0 lb

## 2022-02-10 DIAGNOSIS — C9200 Acute myeloblastic leukemia, not having achieved remission: Secondary | ICD-10-CM

## 2022-02-10 DIAGNOSIS — Z5111 Encounter for antineoplastic chemotherapy: Secondary | ICD-10-CM | POA: Diagnosis not present

## 2022-02-10 MED ORDER — ONDANSETRON 8 MG PO TBDP
8.0000 mg | ORAL_TABLET | Freq: Once | ORAL | Status: AC
  Administered 2022-02-10: 8 mg via ORAL
  Filled 2022-02-10: qty 1

## 2022-02-10 MED ORDER — ONDANSETRON HCL 8 MG PO TABS: 8.0000 mg | ORAL_TABLET | Freq: Once | ORAL | Status: DC

## 2022-02-10 MED ORDER — AZACITIDINE CHEMO SQ INJECTION
50.0000 mg/m2 | Freq: Once | INTRAMUSCULAR | Status: AC
  Administered 2022-02-10: 82.5 mg via SUBCUTANEOUS
  Filled 2022-02-10: qty 3.3

## 2022-02-10 NOTE — Patient Instructions (Signed)
[facility] CANCER CTR AT [place]  Discharge Instructions: Thank you for choosing [facility] to provide your oncology and hematology care.  If you have a lab appointment with the Cancer Center, please go directly to the Cancer Center and check in at the registration area.  Wear comfortable clothing and clothing appropriate for easy access to any Portacath or PICC line.   We strive to give you quality time with your provider. You may need to reschedule your appointment if you arrive late (15 or more minutes).  Arriving late affects you and other patients whose appointments are after yours.  Also, if you miss three or more appointments without notifying the office, you may be dismissed from the clinic at the provider's discretion.      For prescription refill requests, have your pharmacy contact our office and allow 72 hours for refills to be completed.    Today you received the following chemotherapy and/or immunotherapy agents Vidaza      To help prevent nausea and vomiting after your treatment, we encourage you to take your nausea medication as directed.  BELOW ARE SYMPTOMS THAT SHOULD BE REPORTED IMMEDIATELY: *FEVER GREATER THAN 100.4 F (38 C) OR HIGHER *CHILLS OR SWEATING *NAUSEA AND VOMITING THAT IS NOT CONTROLLED WITH YOUR NAUSEA MEDICATION *UNUSUAL SHORTNESS OF BREATH *UNUSUAL BRUISING OR BLEEDING *URINARY PROBLEMS (pain or burning when urinating, or frequent urination) *BOWEL PROBLEMS (unusual diarrhea, constipation, pain near the anus) TENDERNESS IN MOUTH AND THROAT WITH OR WITHOUT PRESENCE OF ULCERS (sore throat, sores in mouth, or a toothache) UNUSUAL RASH, SWELLING OR PAIN  UNUSUAL VAGINAL DISCHARGE OR ITCHING   Items with * indicate a potential emergency and should be followed up as soon as possible or go to the Emergency Department if any problems should occur.  Please show the CHEMOTHERAPY ALERT CARD or IMMUNOTHERAPY ALERT CARD at check-in to the  Emergency Department and triage nurse.  Should you have questions after your visit or need to cancel or reschedule your appointment, please contact [facility] CANCER CTR AT -[facility]COLOG[facility]  [phone] and follow the prompts.  Office hours are 8:00 a.m. to 4:30 p.m. Monday - Friday. Please note that voicemails left after 4:00 p.m. may not be returned until the following business day.  We are closed weekends and major holidays. You have access to a nurse at all times for urgent questions. Please call the main number to the clinic [phone] and follow the prompts.  For any non-urgent questions, you may also contact your provider using MyChart. We now offer e-Visits for anyone 18 and older to request care online for non-urgent symptoms. For details visit [email or web address].   Also download the MyChart app! Go to the app store, search "MyChart", open the app, select [place], and log in with your MyChart username and password.  Masks are optional in the cancer centers. If you would like for your care team to wear a mask while they are taking care of you, please let them know. For doctor visits, patients may have with them one support person who is at least 72 years old. At this time, visitors are not allowed in the infusion area.   

## 2022-02-11 ENCOUNTER — Encounter: Payer: Self-pay | Admitting: Oncology

## 2022-02-11 ENCOUNTER — Inpatient Hospital Stay: Payer: PPO

## 2022-02-11 VITALS — BP 132/65 | HR 79 | Temp 96.8°F | Resp 16

## 2022-02-11 DIAGNOSIS — C9200 Acute myeloblastic leukemia, not having achieved remission: Secondary | ICD-10-CM

## 2022-02-11 DIAGNOSIS — Z5111 Encounter for antineoplastic chemotherapy: Secondary | ICD-10-CM | POA: Diagnosis not present

## 2022-02-11 MED ORDER — AZACITIDINE CHEMO SQ INJECTION
50.0000 mg/m2 | Freq: Once | INTRAMUSCULAR | Status: AC
  Administered 2022-02-11: 82.5 mg via SUBCUTANEOUS
  Filled 2022-02-11: qty 3.3

## 2022-02-11 MED ORDER — ONDANSETRON HCL 8 MG PO TABS: 8.0000 mg | ORAL_TABLET | Freq: Once | ORAL | Status: DC

## 2022-02-11 MED ORDER — ONDANSETRON 8 MG PO TBDP
8.0000 mg | ORAL_TABLET | Freq: Once | ORAL | Status: AC
  Administered 2022-02-11: 8 mg via ORAL
  Filled 2022-02-11: qty 1

## 2022-02-11 NOTE — Patient Instructions (Signed)
[facility]CER [facility] AT [place]  Discharge Instructions: Thank you for choosing [facility] to provide your oncology and hematology care.  If you have a lab appointment with the Cancer Center, please go directly to the Cancer Center and check in at the registration area.  Wear comfortable clothing and clothing appropriate for easy access to any Portacath or PICC line.   We strive to give you quality time with your provider. You may need to reschedule your appointment if you arrive late (15 or more minutes).  Arriving late affects you and other patients whose appointments are after yours.  Also, if you miss three or more appointments without notifying the office, you may be dismissed from the clinic at the provider's discretion.      For prescription refill requests, have your pharmacy contact our office and allow 72 hours for refills to be completed.    Today you received the following chemotherapy and/or immunotherapy agents Vidaza      To help prevent nausea and vomiting after your treatment, we encourage you to take your nausea medication as directed.  BELOW ARE SYMPTOMS THAT SHOULD BE REPORTED IMMEDIATELY: *FEVER GREATER THAN 100.4 F (38 C) OR HIGHER *CHILLS OR SWEATING *NAUSEA AND VOMITING THAT IS NOT CONTROLLED WITH YOUR NAUSEA MEDICATION *UNUSUAL SHORTNESS OF BREATH *UNUSUAL BRUISING OR BLEEDING *URINARY PROBLEMS (pain or burning when urinating, or frequent urination) *BOWEL PROBLEMS (unusual diarrhea, constipation, pain near the anus) TENDERNESS IN MOUTH AND THROAT WITH OR WITHOUT PRESENCE OF ULCERS (sore throat, sores in mouth, or a toothache) UNUSUAL RASH, SWELLING OR PAIN  UNUSUAL VAGINAL DISCHARGE OR ITCHING   Items with * indicate a potential emergency and should be followed up as soon as possible or go to the Emergency Department if any problems should occur.  Please show the CHEMOTHERAPY ALERT CARD or IMMUNOTHERAPY ALERT CARD at check-in to the  Emergency Department and triage nurse.  Should you have questions after your visit or need to cancel or reschedule your appointment, please contact [facility] AT [place]  [phone] and follow the prompts.  Office hours are 8:00 a.m. to 4:30 p.m. Monday - Friday. Please note that voicemails left after 4:00 p.m. may not be returned until the following business day.  We are closed weekends and major holidays. You have access to a nurse at all times for urgent questions. Please call the main number to the clinic [phone] and follow the prompts.  For any non-urgent questions, you may also contact your provider using MyChart. We now offer e-Visits for anyone 18 and older to request care online for non-urgent symptoms. For details visit [email or web address].   Also download the MyChart app! Go to the app store, search "MyChart", open the app, select , and log in with your MyChart username and password.  Masks are optional in the cancer centers. If you would like for your care team to wear a mask while they are taking care of you, please let them know. For doctor visits, patients may have with them one support person who is at least 72 years old. At this time, visitors are not allowed in the infusion area.   

## 2022-02-12 ENCOUNTER — Inpatient Hospital Stay: Payer: PPO

## 2022-02-12 VITALS — BP 136/61 | HR 88 | Temp 97.0°F | Resp 17

## 2022-02-12 DIAGNOSIS — C9200 Acute myeloblastic leukemia, not having achieved remission: Secondary | ICD-10-CM

## 2022-02-12 DIAGNOSIS — Z5111 Encounter for antineoplastic chemotherapy: Secondary | ICD-10-CM | POA: Diagnosis not present

## 2022-02-12 MED ORDER — ONDANSETRON HCL 8 MG PO TABS: 8.0000 mg | ORAL_TABLET | Freq: Once | ORAL | Status: DC

## 2022-02-12 MED ORDER — ONDANSETRON 8 MG PO TBDP
8.0000 mg | ORAL_TABLET | Freq: Once | ORAL | Status: AC
  Administered 2022-02-12: 8 mg via ORAL
  Filled 2022-02-12: qty 1

## 2022-02-12 MED ORDER — AZACITIDINE CHEMO SQ INJECTION
50.0000 mg/m2 | Freq: Once | INTRAMUSCULAR | Status: AC
  Administered 2022-02-12: 82.5 mg via SUBCUTANEOUS
  Filled 2022-02-12: qty 3.3

## 2022-02-12 NOTE — Patient Instructions (Signed)
[facility] AT [place]  Discharge Instructions: Thank you for choosing [facility] to provide your oncology and hematology care.  If you have a lab appointment with the Cancer Center, please go directly to the Cancer Center and check in at the registration area.  Wear comfortable clothing and clothing appropriate for easy access to any Portacath or PICC line.   We strive to give you quality time with your provider. You may need to reschedule your appointment if you arrive late (15 or more minutes).  Arriving late affects you and other patients whose appointments are after yours.  Also, if you miss three or more appointments without notifying the office, you may be dismissed from the clinic at the provider's discretion.      For prescription refill requests, have your pharmacy contact our office and allow 72 hours for refills to be completed.    Today you received the following chemotherapy and/or immunotherapy agents Vidaza      To help prevent nausea and vomiting after your treatment, we encourage you to take your nausea medication as directed.  BELOW ARE SYMPTOMS THAT SHOULD BE REPORTED IMMEDIATELY: *FEVER GREATER THAN 100.4 F (38 C) OR HIGHER *CHILLS OR SWEATING *NAUSEA AND VOMITING THAT IS NOT CONTROLLED WITH YOUR NAUSEA MEDICATION *UNUSUAL SHORTNESS OF BREATH *UNUSUAL BRUISING OR BLEEDING *URINARY PROBLEMS (pain or burning when urinating, or frequent urination) *BOWEL PROBLEMS (unusual diarrhea, constipation, pain near the anus) TENDERNESS IN MOUTH AND THROAT WITH OR WITHOUT PRESENCE OF ULCERS (sore throat, sores in mouth, or a toothache) UNUSUAL RASH, SWELLING OR PAIN  UNUSUAL VAGINAL DISCHARGE OR ITCHING   Items with * indicate a potential emergency and should be followed up as soon as possible or go to the Emergency Department if any problems should occur.  Please show the CHEMOTHERAPY ALERT CARD or IMMUNOTHERAPY ALERT CARD at check-in to the  Emergency Department and triage nurse.  Should you have questions after your visit or need to cancel or reschedule your appointment, please contact [facility] AT [place]  [phone] and follow the prompts.  Office hours are 8:00 a.m. to 4:30 p.m. Monday - Friday. Please note that voicemails left after 4:00 p.m. may not be returned until the following business day.  We are closed weekends and major holidays. You have access to a nurse at all times for urgent questions. Please call the main number to the clinic [phone] and follow the prompts.  For any non-urgent questions, you may also contact your provider using MyChart. We now offer e-Visits for anyone 18 and older to request care online for non-urgent symptoms. For details visit [email or web address].   Also download the MyChart app! Go to the app store, search "MyChart", open the app, select [place], and log in with your MyChart username and password.  Masks are optional in the cancer centers. If you would like for your care team to wear a mask while they are taking care of you, please let them know. For doctor visits, patients may have with them one support person who is at least 72 years old. At this time, visitors are not allowed in the infusion area.   

## 2022-02-13 ENCOUNTER — Inpatient Hospital Stay: Payer: PPO

## 2022-02-13 VITALS — BP 131/66 | HR 74 | Temp 98.3°F | Resp 18

## 2022-02-13 DIAGNOSIS — C9200 Acute myeloblastic leukemia, not having achieved remission: Secondary | ICD-10-CM

## 2022-02-13 DIAGNOSIS — Z5111 Encounter for antineoplastic chemotherapy: Secondary | ICD-10-CM | POA: Diagnosis not present

## 2022-02-13 MED ORDER — ONDANSETRON HCL 8 MG PO TABS: 8.0000 mg | ORAL_TABLET | Freq: Once | ORAL | Status: DC

## 2022-02-13 MED ORDER — AZACITIDINE CHEMO SQ INJECTION
50.0000 mg/m2 | Freq: Once | INTRAMUSCULAR | Status: AC
  Administered 2022-02-13: 82.5 mg via SUBCUTANEOUS
  Filled 2022-02-13: qty 3.3

## 2022-02-13 MED ORDER — ONDANSETRON 8 MG PO TBDP
8.0000 mg | ORAL_TABLET | Freq: Once | ORAL | Status: AC
  Administered 2022-02-13: 8 mg via ORAL
  Filled 2022-02-13: qty 1

## 2022-02-13 NOTE — Patient Instructions (Signed)
[facility] AT [place]  Discharge Instructions: Thank you for choosing [facility] to provide your oncology and hematology care.  If you have a lab appointment with the Cancer Center, please go directly to the Cancer Center and check in at the registration area.  Wear comfortable clothing and clothing appropriate for easy access to any Portacath or PICC line.   We strive to give you quality time with your provider. You may need to reschedule your appointment if you arrive late (15 or more minutes).  Arriving late affects you and other patients whose appointments are after yours.  Also, if you miss three or more appointments without notifying the office, you may be dismissed from the clinic at the provider's discretion.      For prescription refill requests, have your pharmacy contact our office and allow 72 hours for refills to be completed.    Today you received the following chemotherapy and/or immunotherapy agents Vidaza      To help prevent nausea and vomiting after your treatment, we encourage you to take your nausea medication as directed.  BELOW ARE SYMPTOMS THAT SHOULD BE REPORTED IMMEDIATELY: *FEVER GREATER THAN 100.4 F (38 C) OR HIGHER *CHILLS OR SWEATING *NAUSEA AND VOMITING THAT IS NOT CONTROLLED WITH YOUR NAUSEA MEDICATION *UNUSUAL SHORTNESS OF BREATH *UNUSUAL BRUISING OR BLEEDING *URINARY PROBLEMS (pain or burning when urinating, or frequent urination) *BOWEL PROBLEMS (unusual diarrhea, constipation, pain near the anus) TENDERNESS IN MOUTH AND THROAT WITH OR WITHOUT PRESENCE OF ULCERS (sore throat, sores in mouth, or a toothache) UNUSUAL RASH, SWELLING OR PAIN  UNUSUAL VAGINAL DISCHARGE OR ITCHING   Items with * indicate a potential emergency and should be followed up as soon as possible or go to the Emergency Department if any problems should occur.  Please show the CHEMOTHERAPY ALERT CARD or IMMUNOTHERAPY ALERT CARD at check-in to the  Emergency Department and triage nurse.  Should you have questions after your visit or need to cancel or reschedule your appointment, please contact [facility] AT [place]  [phone] and follow the prompts.  Office hours are 8:00 a.m. to 4:30 p.m. Monday - Friday. Please note that voicemails left after 4:00 p.m. may not be returned until the following business day.  We are closed weekends and major holidays. You have access to a nurse at all times for urgent questions. Please call the main number to the clinic [phone] and follow the prompts.  For any non-urgent questions, you may also contact your provider using MyChart. We now offer e-Visits for anyone 18 and older to request care online for non-urgent symptoms. For details visit [email or web address].   Also download the MyChart app! Go to the app store, search "MyChart", open the app, select [place], and log in with your MyChart username and password.  Masks are optional in the cancer centers. If you would like for your care team to wear a mask while they are taking care of you, please let them know. For doctor visits, patients may have with them one support person who is at least 72 years old. At this time, visitors are not allowed in the infusion area.   

## 2022-02-19 ENCOUNTER — Other Ambulatory Visit (HOSPITAL_COMMUNITY): Payer: Self-pay

## 2022-02-26 ENCOUNTER — Other Ambulatory Visit (HOSPITAL_COMMUNITY): Payer: Self-pay

## 2022-03-04 IMAGING — CT CT HIP*L* W/O CM
2 of 6 series · 14 of 46 positions shown, 19 images · non-contrast
Comparison: Radiographs 04/29/2021 and CT pelvis 06/16/2020

CLINICAL DATA: Pain left hip with ambulation, fall.

EXAM:
CT OF THE LEFT HIP WITHOUT CONTRAST
TECHNIQUE: Multidetector CT imaging of the left hip was performed according to
the standard protocol. Multiplanar CT image reconstructions were
also generated.

[Series 3: axial st · axial · 0.48mm/px · z∈[-532,-374]mm · 11 of 93 slices shown, 16 images]
[im 7/93  soft-tissue]
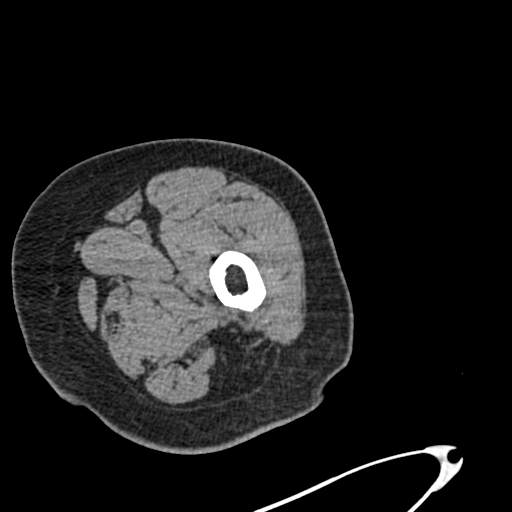
[im 7/93  bone]
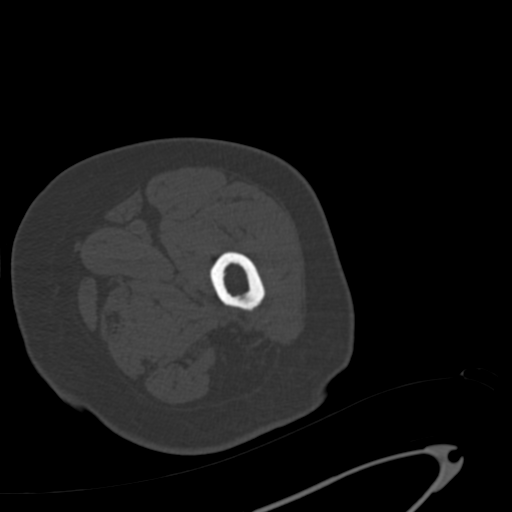
[im 19/93  soft-tissue]
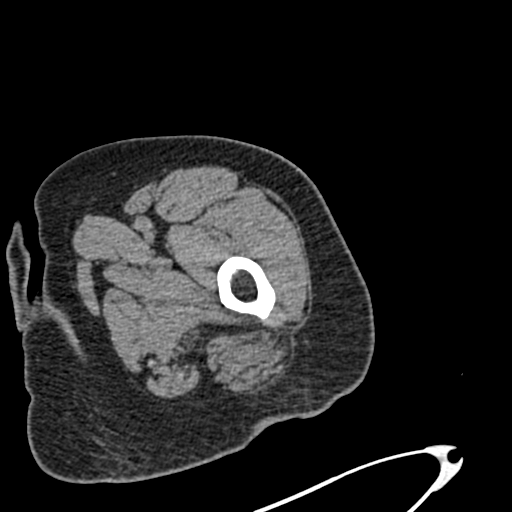
[im 25/93  soft-tissue]
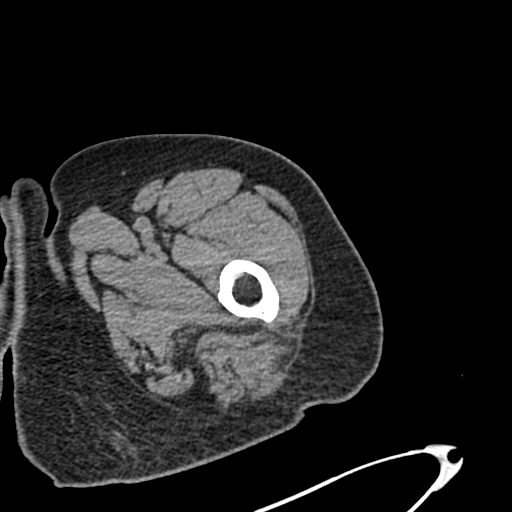
[im 31/93  soft-tissue]
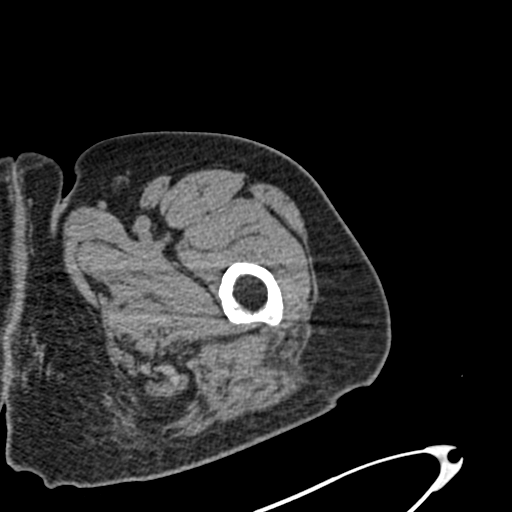
[im 43/93  soft-tissue]
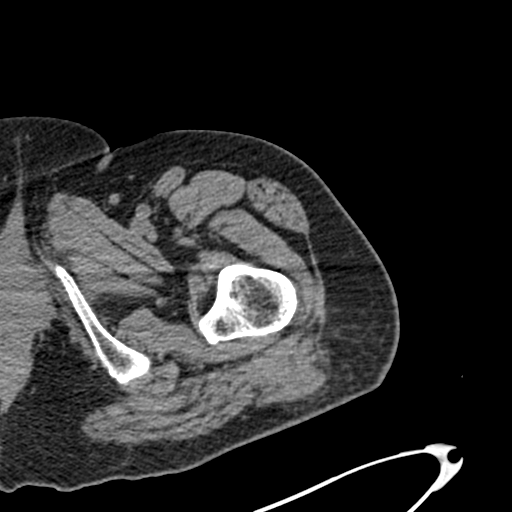
[im 50/93  soft-tissue]
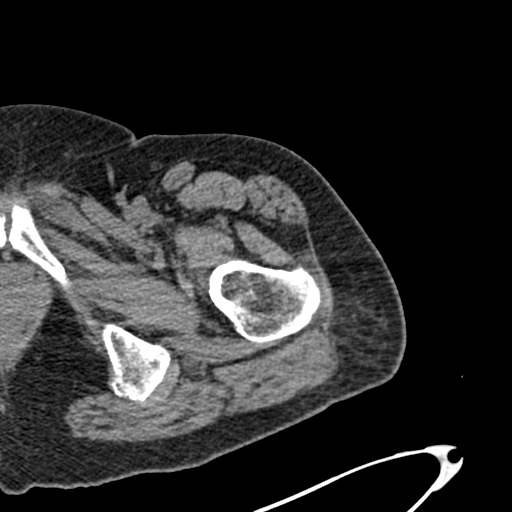
[im 62/93  soft-tissue]
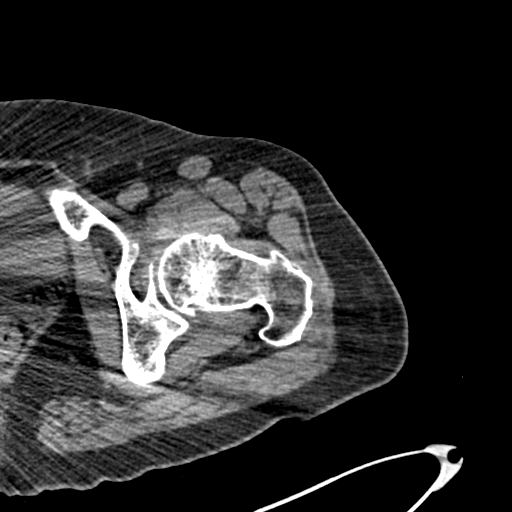
[im 68/93  soft-tissue]
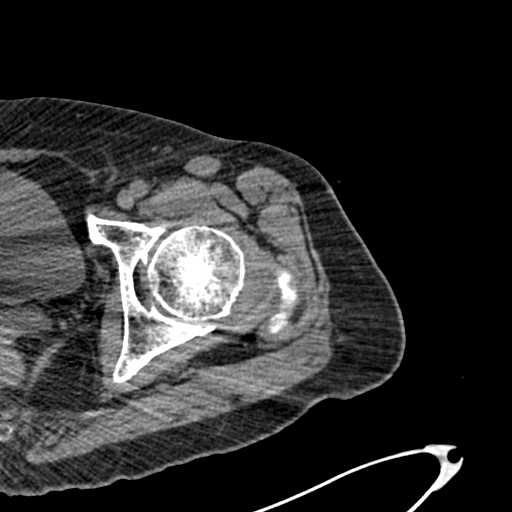
[im 68/93  lung]
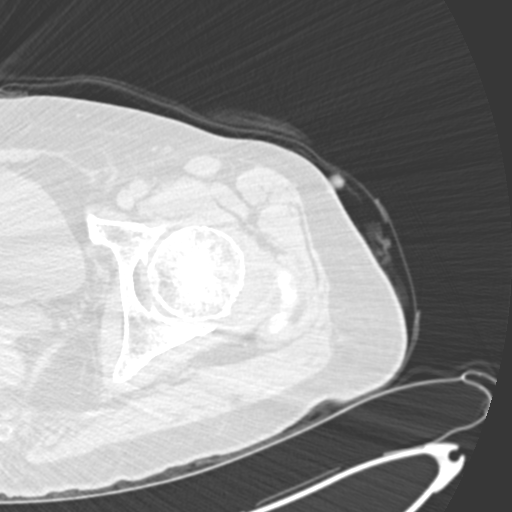
[im 74/93  soft-tissue]
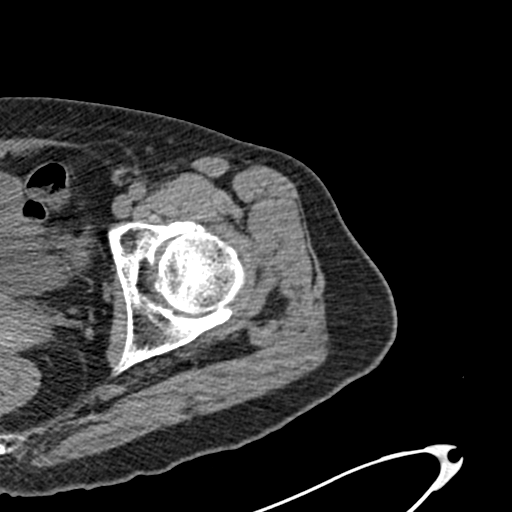
[im 74/93  lung]
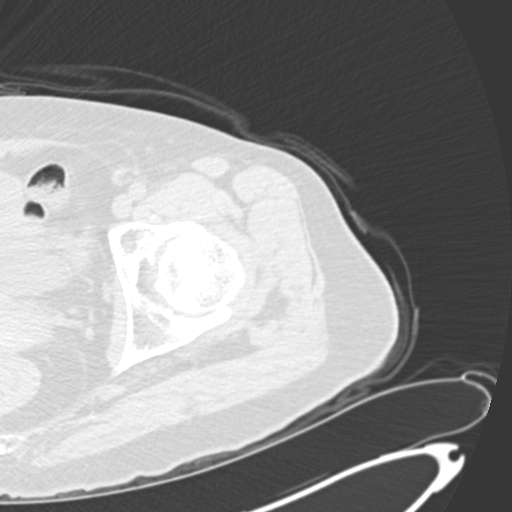
[im 74/93  bone]
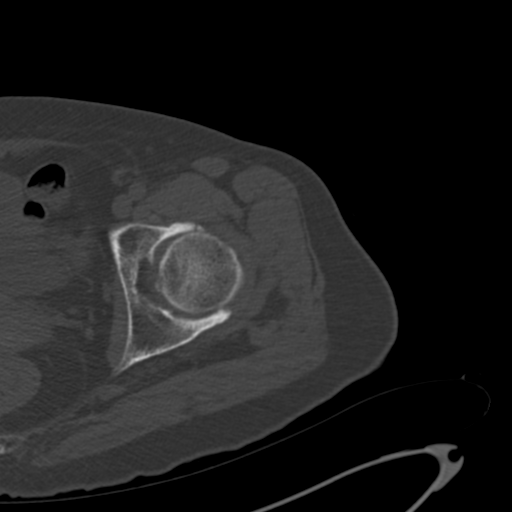
[im 80/93  lung]
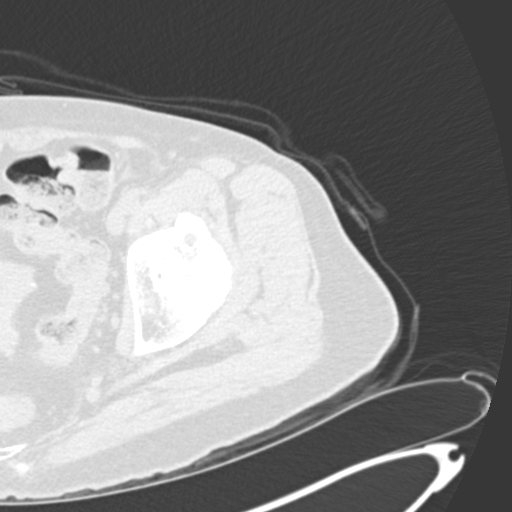
[im 86/93  soft-tissue]
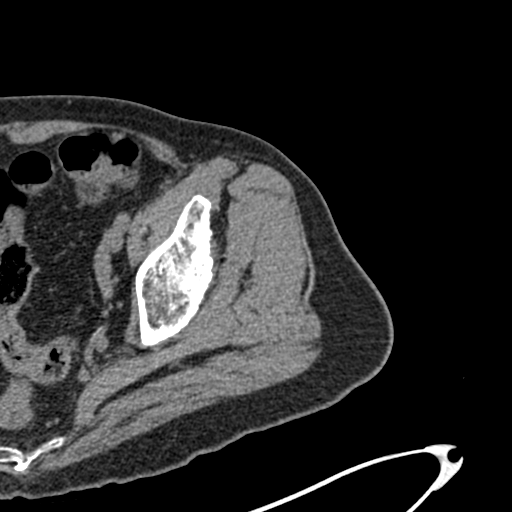
[im 86/93  lung]
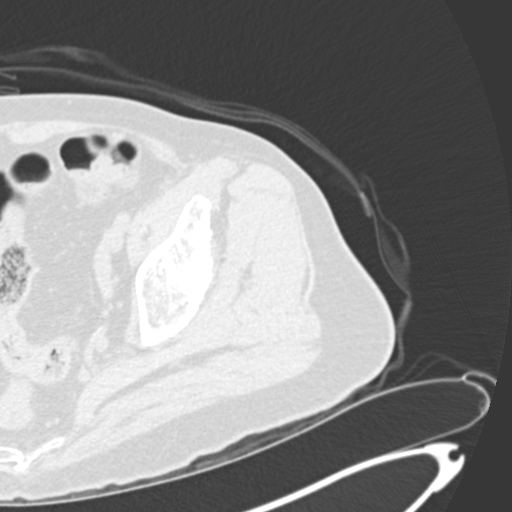

[Series 6: coronal st · coronal · 0.38mm/px · 3 of 101 slices shown]
[im 26/101  soft-tissue]
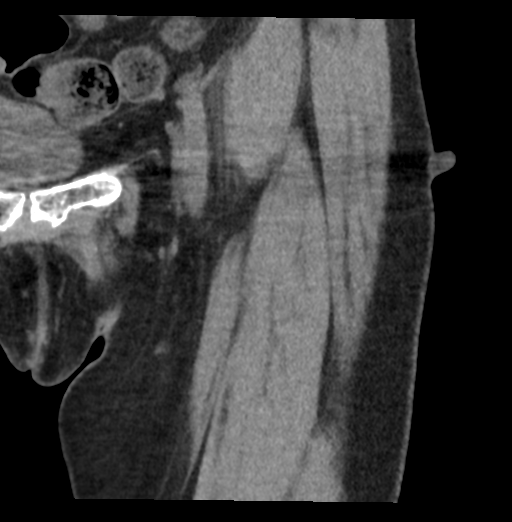
[im 51/101  soft-tissue]
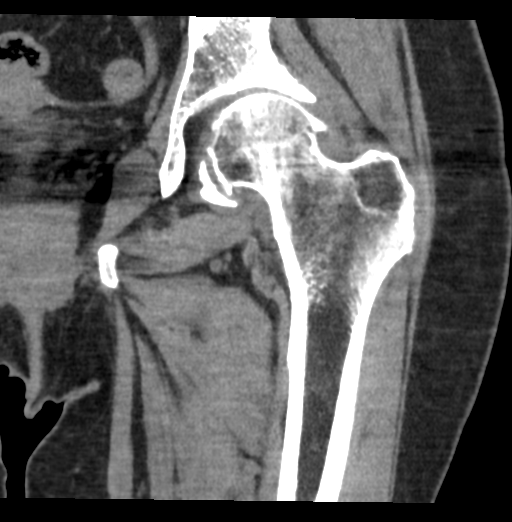
[im 76/101  soft-tissue]
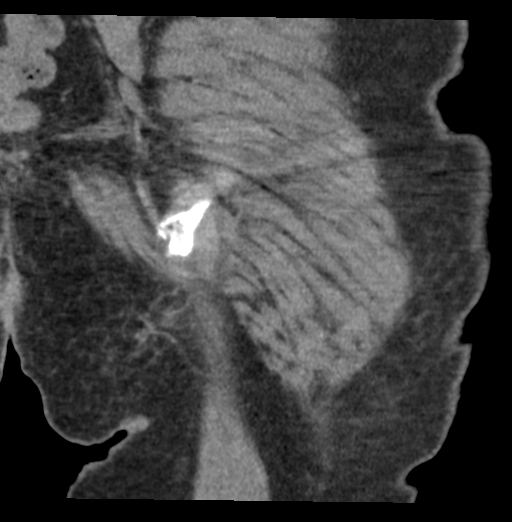

[14 of 46 positions shown; findings below may reference images not displayed]

FINDINGS: Bones/Joint/Cartilage

Substantial spurring of the left femoral head and acetabulum with
degenerative subcortical cysts in the acetabulum as well as an
apparent geode of the acetabular roof which has increased in size,
measuring about 1.5 by 0.7 cm on image 41 series 4 compared to
previous 0.9 by 0.6 cm. 3 mm well corticated chronically fragmented
spur along the left anterior superior acetabulum, the fragmented
nature of this spur component is more clearly delineated on today's
exam. Linear calcification along the anterior capsular margin in the
hip joint on image 30 of series 2 appears to be new.

No hip joint effusion.  Moderate degenerative chondral thinning.

Ligaments

Suboptimally assessed by CT.

Muscles and Tendons

Unremarkable

Soft tissues

Trace subcutaneous edema lateral to the left hip.
IMPRESSION: 1. Subtle linear calcification along the anterior capsular margin of
the hip not well seen on the prior exam, and could represent a small
free osteochondral fragment. There is also some chronic
fragmentation of the anterior acetabular spur.
2. Moderate osteoarthritis of the left hip with associated spurring
and loss of articular space. Increase size of Schmorl's node in the
left acetabular roof.
3. Mild subcutaneous edema lateral to the left hip.
4. No well-defined acute fracture.

## 2022-03-05 ENCOUNTER — Other Ambulatory Visit (HOSPITAL_COMMUNITY): Payer: Self-pay

## 2022-03-09 ENCOUNTER — Inpatient Hospital Stay: Payer: PPO | Admitting: Pharmacist

## 2022-03-09 ENCOUNTER — Other Ambulatory Visit: Payer: Self-pay | Admitting: Oncology

## 2022-03-09 ENCOUNTER — Encounter: Payer: Self-pay | Admitting: Pharmacist

## 2022-03-09 ENCOUNTER — Inpatient Hospital Stay: Payer: PPO | Attending: Oncology

## 2022-03-09 ENCOUNTER — Ambulatory Visit: Payer: PPO

## 2022-03-09 ENCOUNTER — Inpatient Hospital Stay: Payer: PPO

## 2022-03-09 DIAGNOSIS — Z85850 Personal history of malignant neoplasm of thyroid: Secondary | ICD-10-CM | POA: Insufficient documentation

## 2022-03-09 DIAGNOSIS — C9200 Acute myeloblastic leukemia, not having achieved remission: Secondary | ICD-10-CM

## 2022-03-09 DIAGNOSIS — R197 Diarrhea, unspecified: Secondary | ICD-10-CM | POA: Diagnosis not present

## 2022-03-09 DIAGNOSIS — Z853 Personal history of malignant neoplasm of breast: Secondary | ICD-10-CM | POA: Insufficient documentation

## 2022-03-09 DIAGNOSIS — Z9221 Personal history of antineoplastic chemotherapy: Secondary | ICD-10-CM | POA: Diagnosis not present

## 2022-03-09 DIAGNOSIS — Z923 Personal history of irradiation: Secondary | ICD-10-CM | POA: Insufficient documentation

## 2022-03-09 DIAGNOSIS — Z85038 Personal history of other malignant neoplasm of large intestine: Secondary | ICD-10-CM | POA: Diagnosis not present

## 2022-03-09 LAB — CBC WITH DIFFERENTIAL/PLATELET
Abs Immature Granulocytes: 0 10*3/uL (ref 0.00–0.07)
Basophils Absolute: 0 10*3/uL (ref 0.0–0.1)
Basophils Relative: 0 %
Eosinophils Absolute: 0 10*3/uL (ref 0.0–0.5)
Eosinophils Relative: 0 %
HCT: 27.1 % — ABNORMAL LOW (ref 36.0–46.0)
Hemoglobin: 9.3 g/dL — ABNORMAL LOW (ref 12.0–15.0)
Immature Granulocytes: 0 %
Lymphocytes Relative: 46 %
Lymphs Abs: 0.4 10*3/uL — ABNORMAL LOW (ref 0.7–4.0)
MCH: 30.9 pg (ref 26.0–34.0)
MCHC: 34.3 g/dL (ref 30.0–36.0)
MCV: 90 fL (ref 80.0–100.0)
Monocytes Absolute: 0.1 10*3/uL (ref 0.1–1.0)
Monocytes Relative: 7 %
Neutro Abs: 0.4 10*3/uL — CL (ref 1.7–7.7)
Neutrophils Relative %: 47 %
Platelets: 263 10*3/uL (ref 150–400)
RBC: 3.01 MIL/uL — ABNORMAL LOW (ref 3.87–5.11)
RDW: 16.8 % — ABNORMAL HIGH (ref 11.5–15.5)
WBC: 0.9 10*3/uL — CL (ref 4.0–10.5)
nRBC: 0 % (ref 0.0–0.2)

## 2022-03-09 LAB — BASIC METABOLIC PANEL
Anion gap: 17 — ABNORMAL HIGH (ref 5–15)
BUN: 11 mg/dL (ref 8–23)
CO2: 27 mmol/L (ref 22–32)
Calcium: 9.5 mg/dL (ref 8.9–10.3)
Chloride: 102 mmol/L (ref 98–111)
Creatinine, Ser: 0.8 mg/dL (ref 0.44–1.00)
GFR, Estimated: >60 mL/min (ref >60)
Glucose, Bld: 109 mg/dL — ABNORMAL HIGH (ref 70–99)
Potassium: 3.4 mmol/L — ABNORMAL LOW (ref 3.5–5.1)
Sodium: 146 mmol/L — ABNORMAL HIGH (ref 135–145)

## 2022-03-09 MED ORDER — LEVOFLOXACIN 500 MG PO TABS: 500.0000 mg | ORAL_TABLET | Freq: Every day | ORAL | 0 refills | Status: DC

## 2022-03-09 NOTE — Progress Notes (Signed)
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   Bone marrow biopsy on 07/09/21 revealed an increased blast count to 5%, at that time patient was on azacitidine monotherapy. Patient reinitiated venetoclax 14 on/14 off on 08/26/21.  Reason for Consult: Oral chemotherapy follow-up for venetoclax therapy.   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   s/p surgery and XRT   Osteoporosis    Other and unspecified hyperlipidemia    Palpitations    Personal history of chemotherapy    Personal history of malignant neoplasm of large intestine    carcinoma - cecum, s/p right laparoscopic colectomy - s/p chemotherapy and XRT   Personal history of radiation therapy    Pneumonia 2019   PONV (postoperative nausea and vomiting)    Pure hypercholesterolemia    Unspecified hereditary and idiopathic peripheral neuropathy     HEMATOLOGY/ONCOLOGY HISTORY:  Oncology History  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[place])  04/12/2020 Initial Diagnosis   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  MEDICATIONS:  Current Outpatient Medications  Medication Sig Dispense Refill   Azelastine-Fluticasone 137-50 MCG/ACT SUSP SPRAY 2 SPRAYS INTO EACH NOSTRIL EVERY DAY 23 g 3   clonazePAM (KLONOPIN) 0.5 MG tablet Take 1.5 mg by mouth 2 (two) times daily.   0   clotrimazole (LOTRIMIN) 1 % cream Apply 1 application. topically 2 (two) times daily. 30 g 0   diphenoxylate-atropine (LOMOTIL) 2.5-0.025 MG tablet Take 1 tablet by mouth 4 (four) times daily as needed for diarrhea or loose stools. 60 tablet 1   escitalopram (LEXAPRO) 20 MG tablet Take 20 mg by mouth every morning.      levothyroxine (SYNTHROID) 125 MCG tablet TAKE 1 TABLET EVERY DAY ON EMPTY STOMACHWITH A GLASS OF WATER AT LEAST 30-60 MINBEFORE BREAKFAST 90 tablet 1   loperamide (IMODIUM) 2 MG capsule Take by mouth.     metoprolol tartrate (LOPRESSOR) 25 MG tablet TAKE 1 TABLET (25 MG TOTAL) BY MOUTH AS NEEDED (AS NEEDED UP TO TWICE DAILY FOR PALPITATIONS). 60 tablet 1   ondansetron (ZOFRAN) 4  MG tablet Take 4-8 mg by mouth every 8 (eight) hours as needed.     ondansetron (ZOFRAN) 8 MG tablet Take 1 tablet (8 mg total) by mouth 2 (two) times daily as needed for nausea or vomiting. 20 tablet 0   pantoprazole (PROTONIX) 40 MG tablet TAKE ONE TABLET (40 MG) BY MOUTH EVERY DAY 90 tablet 2   potassium chloride SA (KLOR-CON M) 20 MEQ tablet TAKE ONE (1) TABLET BY MOUTH TWO TIMES PER DAY 60 tablet 1   valACYclovir (VALTREX) 500 MG tablet Take 1 tablet (500  mg total) by mouth daily. 90 tablet 3   venetoclax (VENCLEXTA) 100 MG tablet Take 4 tablets (400 mg total) by mouth daily. Take for 14 days, then hold for 14 days. Repeat every 28 days. Take with a meal and a full glass of water. 56 tablet 1   Current Facility-Administered Medications  Medication Dose Route Frequency Provider Last Rate Last Admin   ondansetron (ZOFRAN) tablet 8 mg  8 mg Oral Once Guse, Jacquelynn Cree, FNP       Facility-Administered Medications Ordered in Other Visits  Medication Dose Route Frequency Provider Last Rate Last Admin   potassium chloride (KLOR-CON) CR tablet 40 mEq  40 mEq Oral BID Lloyd Huger, MD   40 mEq at 07/08/20 1136    VITAL SIGNS: There were no vitals taken for this visit. There were no vitals filed for this visit.  Estimated body mass index is 22.31 kg/m as calculated from the following:   Height as of 01/13/22: 5' 4.5" (1.638 m).   Weight as of 02/10/22: 59.9 kg (132 lb).  LABS: CBC:    Component Value Date/Time   WBC 1.4 (LL) 02/09/2022 1317   HGB 11.2 (L) 02/09/2022 1317   HGB 12.5 05/09/2018 1147   HCT 32.0 (L) 02/09/2022 1317   HCT 36.8 05/09/2018 1147   PLT 194 02/09/2022 1317   PLT 285 05/09/2018 1147   MCV 87.9 02/09/2022 1317   MCV 86 05/09/2018 1147   MCV 89 11/30/2013 1419   NEUTROABS 0.7 (L) 02/09/2022 1317   NEUTROABS 2.9 11/20/2013 1404   LYMPHSABS 0.5 (L) 02/09/2022 1317   LYMPHSABS 0.9 (L) 11/20/2013 1404   MONOABS 0.2 02/09/2022 1317   MONOABS 0.2 11/20/2013 1404   EOSABS 0.0 02/09/2022 1317   EOSABS 0.1 11/20/2013 1404   BASOSABS 0.0 02/09/2022 1317   BASOSABS 0.0 11/20/2013 1404   Comprehensive Metabolic Panel:    Component Value Date/Time   NA 142 02/09/2022 1317   NA 143 05/09/2018 1147   NA 145 11/30/2013 1419   K 3.4 (L) 02/09/2022 1317   K 3.5 11/30/2013 1419   CL 109 02/09/2022 1317   CL 109 (H) 11/30/2013 1419   CO2 27 02/09/2022 1317   CO2 29 11/30/2013 1419   BUN 13 02/09/2022 1317   BUN 11  05/09/2018 1147   BUN 15 11/30/2013 1419   CREATININE 0.78 02/09/2022 1317   CREATININE 1.04 11/30/2013 1419   GLUCOSE 106 (H) 02/09/2022 1317   GLUCOSE 97 11/30/2013 1419   CALCIUM 8.6 (L) 02/09/2022 1317   CALCIUM 8.7 11/30/2013 1419   AST 15 12/15/2021 1328   AST 28 11/30/2013 1419   ALT 27 12/15/2021 1328   ALT 71 (H) 11/30/2013 1419   ALKPHOS 101 12/15/2021 1328   ALKPHOS 114 11/30/2013 1419   BILITOT 0.6 12/15/2021 1328   BILITOT 0.7 11/30/2013 1419   PROT 6.4 (L) 12/15/2021 1328   PROT 7.1 11/30/2013 1419  ALBUMIN 3.6 12/15/2021 1328   ALBUMIN 3.3 (L) 11/30/2013 1419     Present during today's visit: patient only  Assessment and Plan: CBC/CMP reviewed ANC low at 0.4, Hgb has dropped over the last month to 9.3, potassium 3.4 Patient's azacitidine has been deferred to next week and she was instructed to hold her venetoclax until she was told to resume Prophylaxis prescription for levofloxacin was sent to her local pharmacy due to neutrophil count.  Patient reports having some potassium at home but not currently taking any, asked to to resume taking 1 tablet daily     Oral Chemotherapy Side Effect/Intolerance:  Fatigue: unchanged, will continue to monitor  Other:  Allergies: During my last visit with Ms. Zenia Resides we discussed intensifying her allergy regimen, but she has not made any changes as of yet. Currently she reports only using Dymista nasal spray with little/no relief. She reports ongoing sinus headaches. Again recommended she add on an antihistamine, this time with a decongestant to help with the sinus pressure. Additionally, the levofloxacin she will start for infection prophylaxis will help if she does have a sinus infection.    Oral Chemotherapy Adherence: no missed doses reported No patient barriers to medication adherence identified.   New medications: none reported  Medication Access Issues: no issues, patient fills at [facility]  Patient  expressed understanding and was in agreement with this plan. She also understands that She can call clinic at any time with any questions, concerns, or complaints.   Follow-up plan: RTC tomorrow for MD evaluation  Thank you for allowing me to participate in the care of this very pleasant patient.   Time Total: 15 mins  Visit consisted of counseling and education on dealing with issues of symptom management in the setting of serious and potentially life-threatening illness.Greater than 50%  of this time was spent counseling and coordinating care related to the above assessment and plan.  Signed by: Darl Pikes, PharmD, BCPS, Salley Slaughter, CPP Hematology/Oncology Clinical Pharmacist Practitioner [place]/DB/AP [facility] [phone]  03/09/2022 1:01 PM

## 2022-03-10 ENCOUNTER — Inpatient Hospital Stay: Payer: PPO

## 2022-03-10 ENCOUNTER — Telehealth: Payer: Self-pay | Admitting: *Deleted

## 2022-03-10 ENCOUNTER — Encounter: Payer: Self-pay | Admitting: Oncology

## 2022-03-10 ENCOUNTER — Inpatient Hospital Stay (HOSPITAL_BASED_OUTPATIENT_CLINIC_OR_DEPARTMENT_OTHER): Payer: PPO | Admitting: Oncology

## 2022-03-10 ENCOUNTER — Inpatient Hospital Stay: Payer: PPO | Admitting: Oncology

## 2022-03-10 ENCOUNTER — Telehealth: Payer: Self-pay

## 2022-03-10 ENCOUNTER — Ambulatory Visit: Payer: PPO | Admitting: Oncology

## 2022-03-10 VITALS — BP 127/73 | HR 94 | Temp 98.0°F | Resp 16 | Ht 64.5 in | Wt 130.0 lb

## 2022-03-10 DIAGNOSIS — C9200 Acute myeloblastic leukemia, not having achieved remission: Secondary | ICD-10-CM

## 2022-03-10 LAB — BASIC METABOLIC PANEL
Anion gap: 9 (ref 5–15)
BUN: 9 mg/dL (ref 8–23)
CO2: 23 mmol/L (ref 22–32)
Calcium: 8.6 mg/dL — ABNORMAL LOW (ref 8.9–10.3)
Chloride: 107 mmol/L (ref 98–111)
Creatinine, Ser: 0.88 mg/dL (ref 0.44–1.00)
GFR, Estimated: >60 mL/min (ref >60)
Glucose, Bld: 109 mg/dL — ABNORMAL HIGH (ref 70–99)
Potassium: 3.5 mmol/L (ref 3.5–5.1)
Sodium: 139 mmol/L (ref 135–145)

## 2022-03-10 LAB — CBC WITH DIFFERENTIAL/PLATELET
Abs Immature Granulocytes: 0 10*3/uL (ref 0.00–0.07)
Basophils Absolute: 0 10*3/uL (ref 0.0–0.1)
Basophils Relative: 0 %
Eosinophils Absolute: 0 10*3/uL (ref 0.0–0.5)
Eosinophils Relative: 1 %
HCT: 27.6 % — ABNORMAL LOW (ref 36.0–46.0)
Hemoglobin: 9.5 g/dL — ABNORMAL LOW (ref 12.0–15.0)
Immature Granulocytes: 0 %
Lymphocytes Relative: 45 %
Lymphs Abs: 0.4 10*3/uL — ABNORMAL LOW (ref 0.7–4.0)
MCH: 30.7 pg (ref 26.0–34.0)
MCHC: 34.4 g/dL (ref 30.0–36.0)
MCV: 89.3 fL (ref 80.0–100.0)
Monocytes Absolute: 0.1 10*3/uL (ref 0.1–1.0)
Monocytes Relative: 9 %
Neutro Abs: 0.4 10*3/uL — CL (ref 1.7–7.7)
Neutrophils Relative %: 45 %
Platelets: 281 10*3/uL (ref 150–400)
RBC: 3.09 MIL/uL — ABNORMAL LOW (ref 3.87–5.11)
RDW: 16.6 % — ABNORMAL HIGH (ref 11.5–15.5)
Smear Review: NORMAL
WBC: 0.8 10*3/uL — CL (ref 4.0–10.5)
nRBC: 0 % (ref 0.0–0.2)

## 2022-03-10 NOTE — Telephone Encounter (Signed)
Per Debra Hale BMBx scheduled for 11/20 arrive at 7:30a for 8:30a appointment. She is aware to enter in the new entrance [facility] and to have a driver with her. NPO after midnight and patient expressed understanding.

## 2022-03-10 NOTE — Telephone Encounter (Signed)
Call from lab Critical result of WBC 0.8 and ANC 0.4. Dr [name] immediately notified. Pt remains in clinic.

## 2022-03-10 NOTE — Progress Notes (Unsigned)
[place]  [phone] [phone]  ID: DEVOTA VIRUET OB: 05/09/1950  MR#: [id number]  CSN#:[id number]  Patient Care Team: [name], MD as PCP - General (Internal Medicine) Wellington Hampshire, MD as PCP - Cardiology (Cardiology) Lloyd Huger, MD as Consulting Physician (Oncology)  CHIEF COMPLAINT: AML.  INTERVAL HISTORY: Patient returns to clinic today for further evaluation and continuation of cycle 22 of Vidaza and venetoclax.  She was recently seen at [facility] who initiated Levaquin for an [place] less than 0.5, but this has improved.  She currently feels well.  She does not complain of diarrhea today.  She continues to be highly anxious.  She denies any pain.  She has no neurologic complaints.  She denies any recent fevers or illnesses. She has no chest pain, shortness of breath, cough, or hemoptysis.  She denies any nausea or vomiting.  She has no urinary complaints.  Patient offers no specific complaints today.  REVIEW OF SYSTEMS:   Review of Systems  Constitutional:  Positive for malaise/fatigue. Negative for fever and weight loss.  Respiratory: Negative.  Negative for cough, hemoptysis and shortness of breath.   Cardiovascular: Negative.  Negative for chest pain and leg swelling.  Gastrointestinal: Negative.  Negative for abdominal pain and diarrhea.  Genitourinary: Negative.  Negative for dysuria.  Musculoskeletal: Negative.  Negative for back pain and falls.  Skin: Negative.  Negative for rash.  Neurological:  Positive for weakness. Negative for dizziness, focal weakness and headaches.  Psychiatric/Behavioral:  Negative for memory loss. The patient is nervous/anxious.     As per HPI. Otherwise, a complete review of systems is negative.  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   s/p surgery and XRT   Osteoporosis    Other and unspecified hyperlipidemia    Palpitations    Personal history of chemotherapy    Personal history of malignant neoplasm of large intestine    carcinoma - cecum, s/p right laparoscopic colectomy - s/p chemotherapy and XRT   Personal history of radiation therapy    Pneumonia 2019   PONV (postoperative nausea and vomiting)    Pure hypercholesterolemia    Unspecified hereditary and idiopathic peripheral neuropathy     PAST SURGICAL HISTORY: Past Surgical History:  Procedure Laterality Date   APPENDECTOMY  1985   BREAST BIOPSY Right 2009   positive   BREAST BIOPSY Right 2009   negative   BREAST LUMPECTOMY Right 2009   breast cancer   CHOLECYSTECTOMY  1995   COLONOSCOPY     COLONOSCOPY WITH PROPOFOL N/A 10/29/2017   Procedure: COLONOSCOPY WITH PROPOFOL;  Surgeon: Manya Silvas, MD;  Location[facility] ENDOSCOPY;  Service: Endoscopy;  Laterality: N/A;   DILATION AND CURETTAGE OF UTERUS  1990   DILATION AND CURETTAGE, DIAGNOSTIC / THERAPEUTIC  1990   ESOPHAGOGASTRODUODENOSCOPY     ESOPHAGOGASTRODUODENOSCOPY (EGD) WITH PROPOFOL N/A 10/29/2017   Procedure:  ESOPHAGOGASTRODUODENOSCOPY (EGD) WITH PROPOFOL;  Surgeon: Manya Silvas, MD;  Location: [facility] ENDOSCOPY;  Service: Endoscopy;  Laterality: N/A;   INCISION AND DRAINAGE PERIRECTAL ABSCESS N/A 06/17/2020   Procedure: IRRIGATION AND DEBRIDEMENT PERIRECTAL ABSCESS;  Surgeon: Jules Husbands, MD;  Location: [facility] ORS;  Service: General;  Laterality: N/A;   IR BONE MARROW BIOPSY & ASPIRATION  07/09/2021   LAPAROSCOPIC PARTIAL COLECTOMY     stage 3-C carcinoma of the cecum, s/p chemotherapy  and xrt   LITHOTRIPSY     SIGMOIDOSCOPY  08/26/1993   THYROID LOBECTOMY  2002   s/p XRT   TOTAL HIP ARTHROPLASTY Right 10/24/2019   Procedure: TOTAL HIP ARTHROPLASTY;  Surgeon: Corky Mull, MD;  Location: [facility] ORS;  Service: Orthopedics;  Laterality: Right;    FAMILY HISTORY: Family History  Problem Relation Age of Onset   Stroke Mother        82s   Alzheimer's disease Mother    Lung cancer Father    Prostate cancer Father    Cancer Father        Colon   Colon cancer Father    Breast cancer Sister        49's   Lung cancer Sister    Breast cancer Maternal Aunt     ADVANCED DIRECTIVES (Y/N):  N  HEALTH MAINTENANCE: Social History   Tobacco Use   Smoking status: Never   Smokeless tobacco: Never  Vaping Use   Vaping Use: Never used  Substance Use Topics   Alcohol use: No    Alcohol/week: 0.0 standard drinks of alcohol   Drug use: No     Colonoscopy:  PAP:  Bone density:  Lipid panel:  Allergies  Allergen Reactions   Demeclocycline Other (See Comments)    Throat swells   Sulfa Antibiotics Other (See Comments)    Other reaction(s): Other (See Comments) Throat swells Other reaction(s): Other (See Comments) Throat swells Throat swells Other reaction(s): Other (See Comments) Throat swells Other reaction(s): Unknown Other reaction(s): Other (See Comments) Throat swells Other reaction(s): Other (See Comments) Other reaction(s): Other (See Comments) Throat swellsOther reaction(s): Other (See Comments) Throat swells   Tetracyclines & Related Other (See Comments)    Throat swells    Levofloxacin Diarrhea    Severe diarrhea   Augmentin [Amoxicillin-Pot Clavulanate] Diarrhea   Bentyl [Dicyclomine Hcl]     unkn   Ciprofloxacin Diarrhea   Codeine Other (See Comments)    dizziness     Dicyclomine Hcl Other (See Comments)    unkn   Epinephrine     Speeds heart up - panic    Flagyl [Metronidazole] Nausea And Vomiting   Librax  [Chlordiazepoxide-Clidinium]     unkn   Novocain [Procaine] Other (See Comments)    "Shaky"   Phenobarbital     unkn   Prednisone     Nervous     Ultram [Tramadol] Other (See Comments)    Sick feeling    Current Outpatient Medications  Medication Sig Dispense Refill   Azelastine-Fluticasone 137-50 MCG/ACT SUSP SPRAY 2 SPRAYS INTO EACH NOSTRIL EVERY DAY 23 g 3   clonazePAM (KLONOPIN) 0.5 MG tablet Take 1.5 mg by mouth 2 (two) times daily.   0   clotrimazole (LOTRIMIN) 1 % cream Apply 1 application. topically 2 (two) times daily. 30 g 0   diphenoxylate-atropine (LOMOTIL) 2.5-0.025 MG tablet Take 1 tablet by mouth 4 (four) times daily as needed for diarrhea or loose stools. 60 tablet 1   escitalopram (LEXAPRO)  20 MG tablet Take 20 mg by mouth every morning.      levothyroxine (SYNTHROID) 125 MCG tablet TAKE 1 TABLET EVERY DAY ON EMPTY STOMACHWITH A GLASS OF WATER AT LEAST 30-60 MINBEFORE BREAKFAST 90 tablet 1   loperamide (IMODIUM) 2 MG capsule Take by mouth.     metoprolol tartrate (LOPRESSOR) 25 MG tablet TAKE 1 TABLET (25 MG TOTAL) BY MOUTH AS NEEDED (AS NEEDED UP TO TWICE DAILY FOR PALPITATIONS). 60 tablet 1   ondansetron (ZOFRAN) 4 MG tablet Take 4-8 mg by mouth every 8 (eight) hours as needed.     ondansetron (ZOFRAN) 8 MG tablet Take 1 tablet (8 mg total) by mouth 2 (two) times daily as needed for nausea or vomiting. 20 tablet 0   pantoprazole (PROTONIX) 40 MG tablet TAKE ONE TABLET (40 MG) BY MOUTH EVERY DAY 90 tablet 2   potassium chloride SA (KLOR-CON M) 20 MEQ tablet Take 20 mEq by mouth daily.     valACYclovir (VALTREX) 500 MG tablet Take 1 tablet (500 mg total) by mouth daily. 90 tablet 3   venetoclax (VENCLEXTA) 100 MG tablet Take 4 tablets (400 mg total) by mouth daily. Take for 14 days, then hold for 14 days. Repeat every 28 days. Take with a meal and a full glass of water. 56 tablet 1   levofloxacin (LEVAQUIN) 500 MG tablet Take 1 tablet (500 mg total) by mouth daily.  (Patient not taking: Reported on 03/10/2022) 30 tablet 0   Current Facility-Administered Medications  Medication Dose Route Frequency Provider Last Rate Last Admin   ondansetron (ZOFRAN) tablet 8 mg  8 mg Oral Once Jodelle Green, FNP        OBJECTIVE: Vitals:   03/10/22 1322  BP: 127/73  Pulse: 94  Resp: 16  Temp: 98 F (36.7 C)  SpO2: 100%     Body mass index is 21.97 kg/m.    ECOG FS:0 - Asymptomatic  General: Well-developed, well-nourished, no acute distress. Eyes: Pink conjunctiva, anicteric sclera. HEENT: Normocephalic, moist mucous membranes. Lungs: No audible wheezing or coughing. Heart: Regular rate and rhythm. Abdomen: Soft, nontender, no obvious distention. Musculoskeletal: No edema, cyanosis, or clubbing. Neuro: Alert, answering all questions appropriately. Cranial nerves grossly intact. Skin: No rashes or petechiae noted. Psych: Normal affect.   LAB RESULTS:  Lab Results  Component Value Date   NA 139 03/10/2022   K 3.5 03/10/2022   CL 107 03/10/2022   CO2 23 03/10/2022   GLUCOSE 109 (H) 03/10/2022   BUN 9 03/10/2022   CREATININE 0.88 03/10/2022   CALCIUM 8.6 (L) 03/10/2022   PROT 6.4 (L) 12/15/2021   ALBUMIN 3.6 12/15/2021   AST 15 12/15/2021   ALT 27 12/15/2021   ALKPHOS 101 12/15/2021   BILITOT 0.6 12/15/2021   GFRNONAA >60 03/10/2022   GFRAA 54 (L) 10/26/2019    Lab Results  Component Value Date   WBC 0.9 (LL) 03/09/2022   NEUTROABS 0.4 (LL) 03/09/2022   HGB 9.3 (L) 03/09/2022   HCT 27.1 (L) 03/09/2022   MCV 90.0 03/09/2022   PLT 263 03/09/2022     STUDIES: No results found.   ASSESSMENT: AML.  PLAN:    1.  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  Patient completed cycle 1 of induction therapy at [facility] with Vidaza and venetoclax.  She only received 5 days of  Vidaza for cycle  2 recently which was interrupted secondary to significant diarrhea and a perirectal abscess.  Her most recent bone marrow biopsy on July 09, 2021 revealed an increased blast count to 5% suggesting slight progression of disease.  Case discussed with Dr. Marvel Plan at [facility].  Given her IDH-1 mutation, she may also benefit from an IDH inhibitor in the future.  Will continue maintenance treatment with Vidaza on days 1 through 5 every 28 days along with venetoclax 14 days on, 14 days off.  Proceed with treatment as scheduled this week.  Return to clinic in 4 weeks for further evaluation and consideration of cycle 23.   2.  Neutropenia: Improved.  Patient has been instructed to discontinue Levaquin. 3.  Thrombocytopenia: Resolved. 4.  Perirectal abscess: Resolved.   5.  Diarrhea: Appears to have resolved.  Continue Lomotil as needed.   6.  Supportive care: Patient previously was given referrals to home health and home palliative care.  Unclear if these services are still active.  Appreciate palliative care input.   7.  Hypokalemia: Mild.  Continue oral potassium supplementation.  8.  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  Patient also received chemotherapy and likely received Adriamycin, but exact regimen is unknown.  She completed 5 years of hormonal therapy in approximately June 2015. Currently, she has no evidence of disease.  Her most recent mammogram on July 29, 2021 was reported as BI-RADS 2.  Repeat in March 2024. 9.  History of colon cancer: Patient also states that she received chemotherapy for this, possibly FOLFOX but again this is unknown. 10.  Restlessness: Patient does not complain of this today.  I spent a total of 30 minutes reviewing chart data, face-to-face evaluation with the patient, counseling and coordination  of care as detailed above.   Patient expressed understanding and was in agreement with this plan. She also understands that She can call clinic at any time with any questions, concerns, or complaints.    Cancer Staging  History of breast cancer Staging form: Breast, AJCC 7th Edition - Clinical stage from 01/07/2016: Stage IA (T1c, N0, M0) - Signed by Lloyd Huger, MD on 01/07/2016 Laterality: Right Estrogen receptor status: Positive Progesterone receptor status: Positive HER2 status: Negative   Lloyd Huger, MD   03/10/2022 1:40 PM

## 2022-03-11 ENCOUNTER — Inpatient Hospital Stay: Payer: PPO

## 2022-03-12 ENCOUNTER — Encounter: Payer: Self-pay | Admitting: Oncology

## 2022-03-12 ENCOUNTER — Inpatient Hospital Stay: Payer: PPO

## 2022-03-12 ENCOUNTER — Other Ambulatory Visit: Payer: Self-pay | Admitting: Oncology

## 2022-03-13 ENCOUNTER — Inpatient Hospital Stay: Payer: PPO

## 2022-03-16 ENCOUNTER — Other Ambulatory Visit: Payer: PPO

## 2022-03-16 ENCOUNTER — Ambulatory Visit: Payer: PPO

## 2022-03-16 ENCOUNTER — Ambulatory Visit: Payer: PPO | Admitting: Oncology

## 2022-03-17 ENCOUNTER — Ambulatory Visit: Payer: PPO

## 2022-03-17 ENCOUNTER — Ambulatory Visit: Payer: PPO | Admitting: Oncology

## 2022-03-18 ENCOUNTER — Ambulatory Visit: Payer: PPO

## 2022-03-19 ENCOUNTER — Ambulatory Visit: Payer: PPO

## 2022-03-20 ENCOUNTER — Other Ambulatory Visit (HOSPITAL_COMMUNITY): Payer: Self-pay | Admitting: Student

## 2022-03-20 ENCOUNTER — Ambulatory Visit: Payer: PPO

## 2022-03-20 DIAGNOSIS — C9200 Acute myeloblastic leukemia, not having achieved remission: Secondary | ICD-10-CM

## 2022-03-20 NOTE — Progress Notes (Signed)
Patient for Bone Marrow Biopsy on Monday 03/23/2022, I called and spoke with the patient on the phone and gave pre-procedure instructions. Pt was made aware to be here at 7:30a at the new entrance, NPO after MN prior to procedure as well as driver post procedure/recovery/discharge. Pt stated understanding. Called 03/20/2022

## 2022-03-20 NOTE — H&P (Signed)
Chief Complaint: Patient was seen in consultation today for bone marrow biopsy with aspiration.   Referring Physician(s): Finnegan,Timothy J  Supervising Physician: Ruthann Cancer  Patient Status: [facility] - Out-pt  History of Present Illness: Debra Hale is a 71 y.o. female with a medical history significant for right breast cancer (2009 s/p lumpectomy and chemotherapy), colon cancer (s/p colectomy, chemotherapy and radiation), and thyroid cancer s/p surgery and radiation. She developed leukopenia in late 2021 and was referred to IR for a bone marrow biopsy with aspiration. 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 She has been seen by IR several times over the past few years for bone marrow biopsies with aspiration.   Recent lab work shows persistent neutropenia. Interventional Radiology has been asked to evaluate this patient for an image-guided bone marrow biopsy with aspiration for further work up.   Past Medical History:  Diagnosis Date   Anxiety    Arthritis    Osteoarthritis   Breast cancer ([place]) 2009   right breast lumpectomy with rad tx   Colon cancer ([place])    surgery with chemo and rad tx   Complication of anesthesia    GERD (gastroesophageal reflux disease)    Heart palpitations    History of hiatal hernia    History of kidney stones    Hypothyroidism    Liver disease    Liver nodule    s/p negative biopsy   Malignant neoplasm of thyroid gland ([place]) 2002   s/p surgery and XRT   Osteoporosis    Other and unspecified hyperlipidemia    Palpitations    Personal history of chemotherapy    Personal history of malignant neoplasm of large intestine    carcinoma - cecum, s/p right laparoscopic colectomy - s/p chemotherapy and XRT   Personal history of radiation therapy    Pneumonia 2019   PONV (postoperative nausea and vomiting)    Pure hypercholesterolemia    Unspecified hereditary and idiopathic peripheral neuropathy      Past Surgical History:  Procedure Laterality Date   APPENDECTOMY  1985   BREAST BIOPSY Right 2009   positive   BREAST BIOPSY Right 2009   negative   BREAST LUMPECTOMY Right 2009   breast cancer   CHOLECYSTECTOMY  1995   COLONOSCOPY     COLONOSCOPY WITH PROPOFOL N/A 10/29/2017   Procedure: COLONOSCOPY WITH PROPOFOL;  Surgeon: Manya Silvas, MD;  Location: [facility] ENDOSCOPY;  Service: Endoscopy;  Laterality: N/A;   DILATION AND CURETTAGE OF UTERUS  1990   DILATION AND CURETTAGE, DIAGNOSTIC / THERAPEUTIC  1990   ESOPHAGOGASTRODUODENOSCOPY     ESOPHAGOGASTRODUODENOSCOPY (EGD) WITH PROPOFOL N/A 10/29/2017   Procedure: ESOPHAGOGASTRODUODENOSCOPY (EGD) WITH PROPOFOL;  Surgeon: Manya Silvas, MD;  Location: [facility] ENDOSCOPY;  Service: Endoscopy;  Laterality: N/A;   INCISION AND DRAINAGE PERIRECTAL ABSCESS N/A 06/17/2020   Procedure: IRRIGATION AND DEBRIDEMENT PERIRECTAL ABSCESS;  Surgeon: Jules Husbands, MD;  Location: [facility] ORS;  Service: General;  Laterality: N/A;   IR BONE MARROW BIOPSY & ASPIRATION  07/09/2021   LAPAROSCOPIC PARTIAL COLECTOMY     stage 3-C carcinoma of the cecum, s/p chemotherapy and xrt   LITHOTRIPSY     SIGMOIDOSCOPY  08/26/1993   THYROID LOBECTOMY  2002   s/p XRT   TOTAL HIP ARTHROPLASTY Right 10/24/2019   Procedure: TOTAL HIP ARTHROPLASTY;  Surgeon: Corky Mull, MD;  Location: [facility] ORS;  Service: Orthopedics;  Laterality: Right;    Allergies:  Demeclocycline, Sulfa antibiotics, Tetracyclines & related, Levofloxacin, Augmentin [amoxicillin-pot clavulanate], Bentyl [dicyclomine hcl], Ciprofloxacin, Codeine, Dicyclomine hcl, Epinephrine, Flagyl [metronidazole], Librax [chlordiazepoxide-clidinium], Novocain [procaine], Phenobarbital, Prednisone, and Ultram [tramadol]  Medications: Prior to Admission medications   Medication Sig Start Date End Date Taking? Authorizing Provider  Azelastine-Fluticasone 137-50 MCG/ACT SUSP SPRAY 2 SPRAYS INTO EACH NOSTRIL EVERY DAY  05/26/21   [name], MD  clonazePAM (KLONOPIN) 0.5 MG tablet Take 1.5 mg by mouth 2 (two) times daily.  09/06/17   [provider]  clotrimazole (LOTRIMIN) 1 % cream Apply 1 application. topically 2 (two) times daily. 10/03/21   Borders, Kirt Boys, NP  diphenoxylate-atropine (LOMOTIL) 2.5-0.025 MG tablet Take 1 tablet by mouth 4 (four) times daily as needed for diarrhea or loose stools. 08/26/21   Lloyd Huger, MD  escitalopram (LEXAPRO) 20 MG tablet Take 20 mg by mouth every morning.     [provider]  levofloxacin (LEVAQUIN) 500 MG tablet Take 1 tablet (500 mg total) by mouth daily. Patient not taking: Reported on 03/10/2022 03/09/22   Darl Pikes, RPH-CPP  levothyroxine (SYNTHROID) 125 MCG tablet TAKE 1 TABLET EVERY DAY ON EMPTY STOMACHWITH A GLASS OF WATER AT LEAST 30-60 MINBEFORE BREAKFAST 02/10/22   [name], MD  loperamide (IMODIUM) 2 MG capsule Take by mouth.    [provider]  metoprolol tartrate (LOPRESSOR) 25 MG tablet TAKE 1 TABLET (25 MG TOTAL) BY MOUTH AS NEEDED (AS NEEDED UP TO TWICE DAILY FOR PALPITATIONS). 09/04/20   Loel Dubonnet, NP  ondansetron (ZOFRAN) 4 MG tablet Take 4-8 mg by mouth every 8 (eight) hours as needed. 10/23/21   [provider]  ondansetron (ZOFRAN) 8 MG tablet Take 1 tablet (8 mg total) by mouth 2 (two) times daily as needed for nausea or vomiting. 08/26/21   Darl Pikes, RPH-CPP  pantoprazole (PROTONIX) 40 MG tablet TAKE ONE TABLET (40 MG) BY MOUTH EVERY DAY 09/10/21   [name], MD  potassium chloride SA (KLOR-CON M) 20 MEQ tablet Take 20 mEq by mouth daily.    [provider]  valACYclovir (VALTREX) 500 MG tablet Take 1 tablet (500 mg total) by mouth daily. 06/09/21   Lloyd Huger, MD  venetoclax (VENCLEXTA) 100 MG tablet Take 4 tablets (400 mg total) by mouth daily. Take for 14 days, then hold for 14 days. Repeat every 28 days. Take with a meal and a full glass of water. 02/02/22    Darl Pikes, RPH-CPP     Family History  Problem Relation Age of Onset   Stroke Mother        39s   Alzheimer's disease Mother    Lung cancer Father    Prostate cancer Father    Cancer Father        Colon   Colon cancer Father    Breast cancer Sister        90's   Lung cancer Sister    Breast cancer Maternal Aunt     Social History   Socioeconomic History   Marital status: Single    Spouse name: Not on file   Number of children: 0   Years of education: Not on file   Highest education level: Not on file  Occupational History   Not on file  Tobacco Use   Smoking status: Never   Smokeless tobacco: Never  Vaping Use   Vaping Use: Never used  Substance and Sexual Activity   Alcohol use: No    Alcohol/week:  0.0 standard drinks of alcohol   Drug use: No   Sexual activity: Not Currently  Other Topics Concern   Not on file  Social History Narrative   Not on file   Social Determinants of Health   Financial Resource Strain: Low Risk  (09/11/2021)   Overall Financial Resource Strain (CARDIA)    Difficulty of Paying Living Expenses: Not very hard  Recent Concern: Financial Resource Strain - Medium Risk (07/21/2021)   Overall Financial Resource Strain (CARDIA)    Difficulty of Paying Living Expenses: Somewhat hard  Food Insecurity: No Food Insecurity (09/11/2021)   Hunger Vital Sign    Worried About Running Out of Food in the Last Year: Never true    Ran Out of Food in the Last Year: Never true  Transportation Needs: No Transportation Needs (09/11/2021)   PRAPARE - Hydrologist (Medical): No    Lack of Transportation (Non-Medical): No  Physical Activity: Insufficiently Active (09/11/2021)   Exercise Vital Sign    Days of Exercise per Week: 3 days    Minutes of Exercise per Session: 10 min  Stress: No Stress Concern Present (09/11/2021)   [place]    Feeling of Stress :  Not at all  Social Connections: Socially Isolated (09/11/2021)   Social Connection and Isolation Panel [NHANES]    Frequency of Communication with Friends and Family: Once a week    Frequency of Social Gatherings with Friends and Family: Once a week    Attends Religious Services: Never    Marine scientist or Organizations: No    Attends Music therapist: Never    Marital Status: Divorced    Review of Systems: A 12 point ROS discussed and pertinent positives are indicated in the HPI above.  All other systems are negative.  Review of Systems  Vital Signs: There were no vitals taken for this visit.  Physical Exam  Imaging: No results found.  Labs:  CBC: Recent Labs    01/12/22 1305 02/09/22 1317 03/09/22 1253 03/10/22 1314  WBC 1.3* 1.4* 0.9* 0.8*  HGB 12.0 11.2* 9.3* 9.5*  HCT 33.8* 32.0* 27.1* 27.6*  PLT 214 194 263 281    COAGS: No results for input(s): "INR", "APTT" in the last 8760 hours.  BMP: Recent Labs    01/12/22 1305 02/09/22 1317 03/09/22 1253 03/10/22 1314  NA 143 142 146* 139  K 3.9 3.4* 3.4* 3.5  CL 109 109 102 107  CO2 _0 GLUCOSE 105* 106* 109* 109*  BUN _1 CALCIUM 8.9 8.6* 9.5 8.6*  CREATININE 0.83 0.78 0.80 0.88  GFRNONAA >60 >60 >60 >60    LIVER FUNCTION TESTS: Recent Labs    09/26/21 1250 10/20/21 1314 11/17/21 1330 12/15/21 1328  BILITOT 1.0 0.9 0.8 0.6  AST 11* _2 ALT _3 ALKPHOS 109 114 103 101  PROT 6.4* 6.5 6.6 6.4*  ALBUMIN 3.5 3.5 3.7 3.6    TUMOR MARKERS: No results for input(s): "AFPTM", "CEA", "CA199", "CHROMGRNA" in the last 8760 hours.  Assessment and Plan:  Acute myeloid leukemia not having achieved remission: Debra Hale, 71 year old female, presents today to the [facility] Interventional Radiology department for an image-guided bone marrow biopsy with aspiration.   Risks and benefits of this procedure were discussed with the  patient and/or patient's family including, but not limited to  bleeding, infection, damage to adjacent structures or low yield requiring additional tests.  All of the questions were answered and there is agreement to proceed. She has been NPO.   Consent signed and in chart.  Thank you for this interesting consult.  I greatly enjoyed meeting Debra Hale and look forward to participating in their care.  A copy of this report was sent to the requesting provider on this date.  Electronically Signed: Soyla Dryer, AGACNP-BC [phone] 03/20/2022, 3:27 PM   I spent a total of  30 Minutes   in face to face in clinical consultation, greater than 50% of which was counseling/coordinating care for bone marrow biopsy with aspiration.

## 2022-03-23 ENCOUNTER — Other Ambulatory Visit: Payer: Self-pay

## 2022-03-23 ENCOUNTER — Ambulatory Visit
Admission: RE | Admit: 2022-03-23 | Discharge: 2022-03-23 | Disposition: A | Discharge status: Home / Self Care | Payer: PPO | Source: Ambulatory Visit | Attending: Oncology | Admitting: Oncology

## 2022-03-23 DIAGNOSIS — D630 Anemia in neoplastic disease: Secondary | ICD-10-CM | POA: Diagnosis not present

## 2022-03-23 DIAGNOSIS — C9200 Acute myeloblastic leukemia, not having achieved remission: Secondary | ICD-10-CM | POA: Insufficient documentation

## 2022-03-23 DIAGNOSIS — D709 Neutropenia, unspecified: Secondary | ICD-10-CM | POA: Diagnosis not present

## 2022-03-23 LAB — CBC WITH DIFFERENTIAL/PLATELET
Abs Immature Granulocytes: 0 10*3/uL (ref 0.00–0.07)
Basophils Absolute: 0 10*3/uL (ref 0.0–0.1)
Basophils Relative: 0 %
Eosinophils Absolute: 0 10*3/uL (ref 0.0–0.5)
Eosinophils Relative: 3 %
HCT: 26.8 % — ABNORMAL LOW (ref 36.0–46.0)
Hemoglobin: 9.2 g/dL — ABNORMAL LOW (ref 12.0–15.0)
Immature Granulocytes: 0 %
Lymphocytes Relative: 49 %
Lymphs Abs: 0.4 10*3/uL — ABNORMAL LOW (ref 0.7–4.0)
MCH: 30.7 pg (ref 26.0–34.0)
MCHC: 34.3 g/dL (ref 30.0–36.0)
MCV: 89.3 fL (ref 80.0–100.0)
Monocytes Absolute: 0.1 10*3/uL (ref 0.1–1.0)
Monocytes Relative: 8 %
Neutro Abs: 0.3 10*3/uL — CL (ref 1.7–7.7)
Neutrophils Relative %: 40 %
Platelets: 164 10*3/uL (ref 150–400)
RBC: 3 MIL/uL — ABNORMAL LOW (ref 3.87–5.11)
RDW: 17.2 % — ABNORMAL HIGH (ref 11.5–15.5)
Smear Review: NORMAL
WBC Morphology: INCREASED
WBC: 0.7 10*3/uL — CL (ref 4.0–10.5)
nRBC: 0 % (ref 0.0–0.2)

## 2022-03-23 MED ORDER — HEPARIN SOD (PORK) LOCK FLUSH 100 UNIT/ML IV SOLN
INTRAVENOUS | Status: AC
  Filled 2022-03-23: qty 5

## 2022-03-23 MED ORDER — MIDAZOLAM HCL 2 MG/2ML IJ SOLN
INTRAMUSCULAR | Status: AC | PRN
  Administered 2022-03-23: 1 mg via INTRAVENOUS

## 2022-03-23 MED ORDER — LIDOCAINE HCL (PF) 1 % IJ SOLN
10.0000 mL | Freq: Once | INTRAMUSCULAR | Status: AC
  Administered 2022-03-23: 10 mL via INTRADERMAL

## 2022-03-23 MED ORDER — SODIUM CHLORIDE 0.9 % IV SOLN: INTRAVENOUS | Status: DC

## 2022-03-23 MED ORDER — MIDAZOLAM HCL 5 MG/5ML IJ SOLN
INTRAMUSCULAR | Status: AC | PRN
  Administered 2022-03-23: 1 mg via INTRAVENOUS

## 2022-03-23 MED ORDER — FENTANYL CITRATE (PF) 100 MCG/2ML IJ SOLN
INTRAMUSCULAR | Status: AC
  Filled 2022-03-23: qty 2

## 2022-03-23 MED ORDER — MIDAZOLAM HCL 2 MG/2ML IJ SOLN
INTRAMUSCULAR | Status: AC
  Filled 2022-03-23: qty 2

## 2022-03-23 MED ORDER — FENTANYL CITRATE (PF) 100 MCG/2ML IJ SOLN
INTRAMUSCULAR | Status: AC | PRN
  Administered 2022-03-23 (×2): 50 ug via INTRAVENOUS

## 2022-03-23 NOTE — Procedures (Signed)
Interventional Radiology Procedure Note  Procedure: CT guided aspirate and core biopsy of right iliac bone  Complications: None  Recommendations: - Bedrest supine x 1 hrs - Hydrocodone PRN  Pain - Follow biopsy results    , MD   

## 2022-03-23 NOTE — Progress Notes (Incomplete)
Bone marrow biopsy. Hx of Acute myeloid leukemia.

## 2022-03-23 NOTE — Progress Notes (Signed)
Patient clinically stable post BMB per Dr Serafina Royals, tolerated well. Vitals stable pre and post procedure. Received Versed 2 mg along with Fentanyl 100 mcg IV for procedure. Denies complaints at present time. Report given TO Carlynn Spry RN post procedure/331.

## 2022-03-25 LAB — SURGICAL PATHOLOGY

## 2022-03-27 ENCOUNTER — Other Ambulatory Visit (HOSPITAL_COMMUNITY): Payer: Self-pay

## 2022-03-30 ENCOUNTER — Ambulatory Visit (INDEPENDENT_AMBULATORY_CARE_PROVIDER_SITE_OTHER): Payer: PPO | Admitting: Internal Medicine

## 2022-03-30 ENCOUNTER — Encounter: Payer: Self-pay | Admitting: Internal Medicine

## 2022-03-30 VITALS — BP 122/70 | HR 65 | Temp 97.8°F | Resp 15 | Ht 64.0 in | Wt 128.2 lb

## 2022-03-30 DIAGNOSIS — Z9109 Other allergy status, other than to drugs and biological substances: Secondary | ICD-10-CM | POA: Diagnosis not present

## 2022-03-30 DIAGNOSIS — Z853 Personal history of malignant neoplasm of breast: Secondary | ICD-10-CM

## 2022-03-30 DIAGNOSIS — G620 Drug-induced polyneuropathy: Secondary | ICD-10-CM

## 2022-03-30 DIAGNOSIS — R195 Other fecal abnormalities: Secondary | ICD-10-CM | POA: Diagnosis not present

## 2022-03-30 DIAGNOSIS — F419 Anxiety disorder, unspecified: Secondary | ICD-10-CM

## 2022-03-30 DIAGNOSIS — C9200 Acute myeloblastic leukemia, not having achieved remission: Secondary | ICD-10-CM

## 2022-03-30 DIAGNOSIS — F320 Major depressive disorder, single episode, mild: Secondary | ICD-10-CM

## 2022-03-30 DIAGNOSIS — E039 Hypothyroidism, unspecified: Secondary | ICD-10-CM | POA: Diagnosis not present

## 2022-03-30 DIAGNOSIS — K219 Gastro-esophageal reflux disease without esophagitis: Secondary | ICD-10-CM | POA: Diagnosis not present

## 2022-03-30 DIAGNOSIS — E7800 Pure hypercholesterolemia, unspecified: Secondary | ICD-10-CM

## 2022-03-30 DIAGNOSIS — T451X5A Adverse effect of antineoplastic and immunosuppressive drugs, initial encounter: Secondary | ICD-10-CM

## 2022-03-30 DIAGNOSIS — Z0000 Encounter for general adult medical examination without abnormal findings: Secondary | ICD-10-CM

## 2022-03-30 NOTE — Assessment & Plan Note (Signed)
Physical today 03/30/22.  Mammogram 07/29/21 - Birads I.

## 2022-03-30 NOTE — Progress Notes (Signed)
Patient ID: Debra Hale, female   DOB: [birth year], 71 y.o.   MRN: [id number]   Subjective:    Patient ID: Debra Hale, female    DOB: 1950/09/22, 71 y.o.   MRN: [id number]   Patient here for  Chief Complaint  Patient presents with   Annual Exam    CPE   .   HPI Here for physical exam.  Seeing Dr [name] for treatment AML.  Continues on vidaza and venetoclax.  S/p recent bone marrow biopsy.  Follow up planned to discuss.  Reports increased stress and some depression related to above.  Discussed.  No SI.  Continue f/u with Dr Clovis Riley.  Tries to stay active.  No chest pain reported.  Breathing overall stable.  Some intermittent sinus issues.  States better if uses dymista. Diarrhea occurs after eating.  This has been a persistent issue.     Past Medical History:  Diagnosis Date   Abdominal pain 03/06/2016   Acute pericarditis 05/31/2013   Anxiety    Arthritis    Osteoarthritis   Breast cancer ([place]) 2009   right breast lumpectomy with rad tx   Colon cancer ([place])    surgery with chemo and rad tx   Complication of anesthesia    GERD (gastroesophageal reflux disease)    Heart palpitations    History of hiatal hernia    History of kidney stones    History of thyroid cancer 09/18/2008   Qualifier: Diagnosis of   By: Orville Govern CMA, Carol       Hypothyroidism    Liver disease    Liver nodule    s/p negative biopsy   Malignant neoplasm of thyroid gland ([place]) 2002   s/p surgery and XRT   Osteoporosis    Other and unspecified hyperlipidemia    Palpitations    Personal history of chemotherapy    Personal history of malignant neoplasm of large intestine    carcinoma - cecum, s/p right laparoscopic colectomy - s/p chemotherapy and XRT   Personal history of radiation therapy    Pneumonia 2019   PONV (postoperative nausea and vomiting)    Pure hypercholesterolemia    Unspecified hereditary and idiopathic peripheral neuropathy    Past Surgical History:  Procedure Laterality Date    APPENDECTOMY  1985   BREAST BIOPSY Right 2009   positive   BREAST BIOPSY Right 2009   negative   BREAST LUMPECTOMY Right 2009   breast cancer   CHOLECYSTECTOMY  1995   COLONOSCOPY     COLONOSCOPY WITH PROPOFOL N/A 10/29/2017   Procedure: COLONOSCOPY WITH PROPOFOL;  Surgeon: Manya Silvas, MD;  Location: [facility] ENDOSCOPY;  Service: Endoscopy;  Laterality: N/A;   DILATION AND CURETTAGE OF UTERUS  1990   DILATION AND CURETTAGE, DIAGNOSTIC / THERAPEUTIC  1990   ESOPHAGOGASTRODUODENOSCOPY     ESOPHAGOGASTRODUODENOSCOPY (EGD) WITH PROPOFOL N/A 10/29/2017   Procedure: ESOPHAGOGASTRODUODENOSCOPY (EGD) WITH PROPOFOL;  Surgeon: Manya Silvas, MD;  Location: [facility] ENDOSCOPY;  Service: Endoscopy;  Laterality: N/A;   INCISION AND DRAINAGE PERIRECTAL ABSCESS N/A 06/17/2020   Procedure: IRRIGATION AND DEBRIDEMENT PERIRECTAL ABSCESS;  Surgeon: Jules Husbands, MD;  Location: [facility] ORS;  Service: General;  Laterality: N/A;   IR BONE MARROW BIOPSY & ASPIRATION  07/09/2021   LAPAROSCOPIC PARTIAL COLECTOMY     stage 3-C carcinoma of the cecum, s/p chemotherapy and xrt   LITHOTRIPSY     SIGMOIDOSCOPY  08/26/1993   THYROID LOBECTOMY  2002   s/p XRT  TOTAL HIP ARTHROPLASTY Right 10/24/2019   Procedure: TOTAL HIP ARTHROPLASTY;  Surgeon: Corky Mull, MD;  Location: [facility] ORS;  Service: Orthopedics;  Laterality: Right;   Family History  Problem Relation Age of Onset   Stroke Mother        21s   Alzheimer's disease Mother    Lung cancer Father    Prostate cancer Father    Cancer Father        Colon   Colon cancer Father    Breast cancer Sister        60's   Lung cancer Sister    Breast cancer Maternal Aunt    Social History   Socioeconomic History   Marital status: Single    Spouse name: Not on file   Number of children: 0   Years of education: Not on file   Highest education level: Not on file  Occupational History   Not on file  Tobacco Use   Smoking status: Never   Smokeless tobacco:  Never  Vaping Use   Vaping Use: Never used  Substance and Sexual Activity   Alcohol use: No    Alcohol/week: 0.0 standard drinks of alcohol   Drug use: No   Sexual activity: Not Currently  Other Topics Concern   Not on file  Social History Narrative   Not on file   Social Determinants of Health   Financial Resource Strain: Low Risk  (09/11/2021)   Overall Financial Resource Strain (CARDIA)    Difficulty of Paying Living Expenses: Not very hard  Recent Concern: Financial Resource Strain - Medium Risk (07/21/2021)   Overall Financial Resource Strain (CARDIA)    Difficulty of Paying Living Expenses: Somewhat hard  Food Insecurity: No Food Insecurity (04/01/2022)   Hunger Vital Sign    Worried About Running Out of Food in the Last Year: Never true    Ran Out of Food in the Last Year: Never true  Transportation Needs: No Transportation Needs (04/01/2022)   PRAPARE - Hydrologist (Medical): No    Lack of Transportation (Non-Medical): No  Physical Activity: Insufficiently Active (09/11/2021)   Exercise Vital Sign    Days of Exercise per Week: 3 days    Minutes of Exercise per Session: 10 min  Stress: No Stress Concern Present (09/11/2021)   [place]    Feeling of Stress : Not at all  Social Connections: Socially Isolated (09/11/2021)   Social Connection and Isolation Panel [NHANES]    Frequency of Communication with Friends and Family: Once a week    Frequency of Social Gatherings with Friends and Family: Once a week    Attends Religious Services: Never    Marine scientist or Organizations: No    Attends Archivist Meetings: Never    Marital Status: Divorced     Review of Systems  Constitutional:  Positive for fatigue. Negative for unexpected weight change.  HENT:  Negative for sinus pressure and sore throat.        Intermittent congestion as outlined.    Eyes:   Negative for pain and visual disturbance.  Respiratory:  Negative for cough, chest tightness and shortness of breath.   Cardiovascular:  Negative for chest pain, palpitations and leg swelling.  Gastrointestinal:  Positive for diarrhea. Negative for abdominal pain, constipation and vomiting.  Genitourinary:  Negative for difficulty urinating and dysuria.  Musculoskeletal:  Negative for joint swelling and myalgias.  Skin:  Negative for color change and rash.  Neurological:  Negative for dizziness and headaches.  Hematological:  Negative for adenopathy. Does not bruise/bleed easily.  Psychiatric/Behavioral:  Negative for agitation and dysphoric mood.        Objective:     BP 122/70 (BP Location: Left Arm, Patient Position: Sitting, Cuff Size: Small)   Pulse 65   Temp 97.8 F (36.6 C) (Temporal)   Resp 15   Ht 5' 4" (1.626 m)   Wt 128 lb 3.2 oz (58.2 kg)   SpO2 97%   BMI 22.01 kg/m  Wt Readings from Last 3 Encounters:  04/04/22 134 lb 7.7 oz (61 kg)  04/02/22 136 lb 0.4 oz (61.7 kg)  03/30/22 128 lb 3.2 oz (58.2 kg)    Physical Exam Vitals reviewed.  Constitutional:      General: She is not in acute distress.    Appearance: Normal appearance. She is well-developed.  HENT:     Head: Normocephalic and atraumatic.     Right Ear: External ear normal.     Left Ear: External ear normal.  Eyes:     General: No scleral icterus.       Right eye: No discharge.        Left eye: No discharge.     Conjunctiva/sclera: Conjunctivae normal.  Neck:     Thyroid: No thyromegaly.  Cardiovascular:     Rate and Rhythm: Normal rate and regular rhythm.  Pulmonary:     Effort: No tachypnea, accessory muscle usage or respiratory distress.     Breath sounds: Normal breath sounds. No decreased breath sounds or wheezing.  Chest:  Breasts:    Right: No inverted nipple, mass, nipple discharge or tenderness (no axillary adenopathy).     Left: No inverted nipple, mass, nipple discharge or  tenderness (no axilarry adenopathy).  Abdominal:     General: Bowel sounds are normal.     Palpations: Abdomen is soft.     Tenderness: There is no abdominal tenderness.  Musculoskeletal:        General: No swelling or tenderness.     Cervical back: Neck supple.  Lymphadenopathy:     Cervical: No cervical adenopathy.  Skin:    Findings: No erythema or rash.  Neurological:     Mental Status: She is alert and oriented to person, place, and time.  Psychiatric:        Mood and Affect: Mood normal.        Behavior: Behavior normal.      Facility-Administered Encounter Medications as of 03/30/2022  Medication   [DISCONTINUED] ondansetron (ZOFRAN) tablet 8 mg   Outpatient Encounter Medications as of 03/30/2022  Medication Sig   Azelastine-Fluticasone 137-50 MCG/ACT SUSP SPRAY 2 SPRAYS INTO EACH NOSTRIL EVERY DAY   diphenoxylate-atropine (LOMOTIL) 2.5-0.025 MG tablet Take 1 tablet by mouth 4 (four) times daily as needed for diarrhea or loose stools.   escitalopram (LEXAPRO) 20 MG tablet Take 20 mg by mouth every morning.    levothyroxine (SYNTHROID) 125 MCG tablet TAKE 1 TABLET EVERY DAY ON EMPTY STOMACHWITH A GLASS OF WATER AT LEAST 30-60 MINBEFORE BREAKFAST   loperamide (IMODIUM) 2 MG capsule Take by mouth.   metoprolol tartrate (LOPRESSOR) 25 MG tablet TAKE 1 TABLET (25 MG TOTAL) BY MOUTH AS NEEDED (AS NEEDED UP TO TWICE DAILY FOR PALPITATIONS).   pantoprazole (PROTONIX) 40 MG tablet TAKE ONE TABLET (40 MG) BY MOUTH EVERY DAY   potassium chloride SA (KLOR-CON M) 20 MEQ tablet Take  20 mEq by mouth daily.   venetoclax (VENCLEXTA) 100 MG tablet Take 4 tablets (400 mg total) by mouth daily. Take for 14 days, then hold for 14 days. Repeat every 28 days. Take with a meal and a full glass of water.   [DISCONTINUED] clonazePAM (KLONOPIN) 0.5 MG tablet Take 1.5 mg by mouth 2 (two) times daily.    [DISCONTINUED] clotrimazole (LOTRIMIN) 1 % cream Apply 1 application. topically 2 (two) times  daily. (Patient not taking: Reported on 03/31/2022)   [DISCONTINUED] levofloxacin (LEVAQUIN) 500 MG tablet Take 1 tablet (500 mg total) by mouth daily.   [DISCONTINUED] ondansetron (ZOFRAN) 4 MG tablet Take 4-8 mg by mouth every 8 (eight) hours as needed.   [DISCONTINUED] ondansetron (ZOFRAN) 8 MG tablet Take 1 tablet (8 mg total) by mouth 2 (two) times daily as needed for nausea or vomiting. (Patient not taking: Reported on 04/04/2022)   [DISCONTINUED] valACYclovir (VALTREX) 500 MG tablet Take 1 tablet (500 mg total) by mouth daily.     Lab Results  Component Value Date   WBC 0.4 (LL) 04/05/2022   HGB 7.8 (L) 04/05/2022   HCT 23.3 (L) 04/05/2022   PLT 40 (L) 04/05/2022   GLUCOSE 115 (H) 04/05/2022   CHOL 163 02/20/2020   TRIG 110.0 02/20/2020   HDL 40.70 02/20/2020   LDLDIRECT 126.0 07/11/2015   LDLCALC 100 (H) 02/20/2020   ALT 17 04/02/2022   AST 9 (L) 04/02/2022   NA 139 04/05/2022   K 2.9 (L) 04/05/2022   CL 113 (H) 04/05/2022   CREATININE 0.82 04/05/2022   BUN 12 04/05/2022   CO2 19 (L) 04/05/2022   TSH 0.034 (L) 10/02/2020   INR 1.3 (H) 04/04/2022   HGBA1C 5.3 12/10/2016    CT BONE MARROW BIOPSY & ASPIRATION  Result Date: 03/23/2022 INDICATION: 71 year old female with history of AML. EXAM: CT-GUIDED BONE MARROW BIOPSY AND ASPIRATION MEDICATIONS: None ANESTHESIA/SEDATION: Fentanyl 100 mcg IV; Versed 2 mg IV Sedation Time: 10 minutes; The patient was continuously monitored during the procedure by the interventional radiology nurse under my direct supervision. COMPLICATIONS: None immediate. PROCEDURE: Informed consent was obtained from the patient following an explanation of the procedure, risks, benefits and alternatives. The patient understands, agrees and consents for the procedure. All questions were addressed. A time out was performed prior to the initiation of the procedure. The patient was positioned prone and non-contrast localization CT was performed of the pelvis to  demonstrate the iliac marrow spaces. The operative site was prepped and draped in the usual sterile fashion. Under sterile conditions and local anesthesia, a 22 gauge spinal needle was utilized for procedural planning. Next, an 11 gauge coaxial bone biopsy needle was advanced into the right iliac marrow space. Needle position was confirmed with CT imaging. Initially, a bone marrow aspiration was performed. Next, a bone marrow biopsy was obtained with the 11 gauge outer bone marrow device. Samples were prepared with the cytotechnologist and deemed adequate. The needle was removed and superficial hemostasis was obtained with manual compression. A dressing was applied. The patient tolerated the procedure well without immediate post procedural complication. IMPRESSION: Successful CT guided right iliac bone marrow aspiration and core biopsy. Ruthann Cancer, MD [facility] Radiology Electronically Signed   By: Ruthann Cancer M.D.   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  Undergoing treatments as outlined. Discussed.       Anxiety    Continue lexapro.  Continue f/u with Dr Clovis Riley       Chemotherapy-induced neuropathy ([place])    Stable.       Environmental allergies    Does well with dymista.  Headache resolves with dymista.       GERD (gastroesophageal reflux disease)    No upper symptoms reported. On protonix.       Health care maintenance    Physical today 03/30/22.  Mammogram 07/29/21 - [facility] I.        History of breast cancer    Mammogram 07/29/21 - [facility] II.       Hypercholesterolemia    Follow lipid panel.       Hypothyroidism    On thyroid replacement.  Follow tsh.       Loose stools    Taking imodium. Persistent issue.  Has seen GI.  Consider f/u - question of any further intervention to help with persistent loose  stool.       Major depressive disorder, single episode, mild (HCC)    Seeing Dr Clovis Riley.  Currently on lexapro and clonazepam.  Denies SI.  Follow.  Continue f/u with psychiatry.       Other Visit Diagnoses     Routine general medical examination at a health care facility    -  Primary        [name], MD

## 2022-03-31 ENCOUNTER — Encounter: Payer: Self-pay | Admitting: Emergency Medicine

## 2022-03-31 ENCOUNTER — Inpatient Hospital Stay
Admission: EM | Admit: 2022-03-31 | Discharge: 2022-04-02 | DRG: 644 | Disposition: A | Discharge status: Home / Self Care | Payer: PPO | Attending: Internal Medicine | Admitting: Internal Medicine

## 2022-03-31 ENCOUNTER — Emergency Department: Payer: PPO

## 2022-03-31 ENCOUNTER — Other Ambulatory Visit: Payer: Self-pay

## 2022-03-31 ENCOUNTER — Encounter (HOSPITAL_COMMUNITY): Payer: Self-pay | Admitting: Oncology

## 2022-03-31 DIAGNOSIS — E039 Hypothyroidism, unspecified: Secondary | ICD-10-CM | POA: Diagnosis present

## 2022-03-31 DIAGNOSIS — K769 Liver disease, unspecified: Secondary | ICD-10-CM | POA: Diagnosis not present

## 2022-03-31 DIAGNOSIS — Z881 Allergy status to other antibiotic agents status: Secondary | ICD-10-CM

## 2022-03-31 DIAGNOSIS — D849 Immunodeficiency, unspecified: Secondary | ICD-10-CM | POA: Diagnosis present

## 2022-03-31 DIAGNOSIS — Z85038 Personal history of other malignant neoplasm of large intestine: Secondary | ICD-10-CM | POA: Diagnosis not present

## 2022-03-31 DIAGNOSIS — N179 Acute kidney failure, unspecified: Secondary | ICD-10-CM | POA: Diagnosis present

## 2022-03-31 DIAGNOSIS — E876 Hypokalemia: Secondary | ICD-10-CM | POA: Diagnosis present

## 2022-03-31 DIAGNOSIS — R109 Unspecified abdominal pain: Secondary | ICD-10-CM | POA: Diagnosis present

## 2022-03-31 DIAGNOSIS — E7800 Pure hypercholesterolemia, unspecified: Secondary | ICD-10-CM | POA: Diagnosis present

## 2022-03-31 DIAGNOSIS — R7989 Other specified abnormal findings of blood chemistry: Secondary | ICD-10-CM | POA: Diagnosis present

## 2022-03-31 DIAGNOSIS — C9200 Acute myeloblastic leukemia, not having achieved remission: Secondary | ICD-10-CM | POA: Diagnosis present

## 2022-03-31 DIAGNOSIS — E860 Dehydration: Secondary | ICD-10-CM | POA: Diagnosis present

## 2022-03-31 DIAGNOSIS — N136 Pyonephrosis: Principal | ICD-10-CM | POA: Diagnosis present

## 2022-03-31 DIAGNOSIS — Z888 Allergy status to other drugs, medicaments and biological substances status: Secondary | ICD-10-CM

## 2022-03-31 DIAGNOSIS — N390 Urinary tract infection, site not specified: Secondary | ICD-10-CM | POA: Diagnosis present

## 2022-03-31 DIAGNOSIS — R112 Nausea with vomiting, unspecified: Secondary | ICD-10-CM | POA: Diagnosis present

## 2022-03-31 DIAGNOSIS — N1330 Unspecified hydronephrosis: Secondary | ICD-10-CM | POA: Diagnosis present

## 2022-03-31 DIAGNOSIS — M810 Age-related osteoporosis without current pathological fracture: Secondary | ICD-10-CM | POA: Diagnosis present

## 2022-03-31 DIAGNOSIS — K449 Diaphragmatic hernia without obstruction or gangrene: Secondary | ICD-10-CM | POA: Diagnosis not present

## 2022-03-31 DIAGNOSIS — D61818 Other pancytopenia: Secondary | ICD-10-CM | POA: Diagnosis present

## 2022-03-31 DIAGNOSIS — Z96641 Presence of right artificial hip joint: Secondary | ICD-10-CM | POA: Diagnosis present

## 2022-03-31 DIAGNOSIS — E878 Other disorders of electrolyte and fluid balance, not elsewhere classified: Secondary | ICD-10-CM | POA: Diagnosis present

## 2022-03-31 DIAGNOSIS — Z853 Personal history of malignant neoplasm of breast: Secondary | ICD-10-CM

## 2022-03-31 DIAGNOSIS — K219 Gastro-esophageal reflux disease without esophagitis: Secondary | ICD-10-CM | POA: Diagnosis present

## 2022-03-31 DIAGNOSIS — Z882 Allergy status to sulfonamides status: Secondary | ICD-10-CM

## 2022-03-31 DIAGNOSIS — Z87442 Personal history of urinary calculi: Secondary | ICD-10-CM

## 2022-03-31 DIAGNOSIS — D701 Agranulocytosis secondary to cancer chemotherapy: Secondary | ICD-10-CM | POA: Diagnosis present

## 2022-03-31 DIAGNOSIS — Z85850 Personal history of malignant neoplasm of thyroid: Secondary | ICD-10-CM | POA: Diagnosis not present

## 2022-03-31 DIAGNOSIS — T451X5A Adverse effect of antineoplastic and immunosuppressive drugs, initial encounter: Secondary | ICD-10-CM | POA: Diagnosis present

## 2022-03-31 DIAGNOSIS — Z9049 Acquired absence of other specified parts of digestive tract: Secondary | ICD-10-CM

## 2022-03-31 DIAGNOSIS — K3189 Other diseases of stomach and duodenum: Secondary | ICD-10-CM | POA: Diagnosis not present

## 2022-03-31 DIAGNOSIS — Z883 Allergy status to other anti-infective agents status: Secondary | ICD-10-CM

## 2022-03-31 DIAGNOSIS — Z885 Allergy status to narcotic agent status: Secondary | ICD-10-CM

## 2022-03-31 DIAGNOSIS — N132 Hydronephrosis with renal and ureteral calculous obstruction: Secondary | ICD-10-CM | POA: Diagnosis not present

## 2022-03-31 DIAGNOSIS — Z79890 Hormone replacement therapy (postmenopausal): Secondary | ICD-10-CM

## 2022-03-31 DIAGNOSIS — R319 Hematuria, unspecified: Secondary | ICD-10-CM | POA: Diagnosis present

## 2022-03-31 DIAGNOSIS — F419 Anxiety disorder, unspecified: Secondary | ICD-10-CM | POA: Diagnosis present

## 2022-03-31 DIAGNOSIS — Z79899 Other long term (current) drug therapy: Secondary | ICD-10-CM

## 2022-03-31 LAB — URINALYSIS, ROUTINE W REFLEX MICROSCOPIC
Bacteria, UA: NONE SEEN
Bilirubin Urine: NEGATIVE
Glucose, UA: NEGATIVE mg/dL
Ketones, ur: NEGATIVE mg/dL
Leukocytes,Ua: NEGATIVE
Nitrite: NEGATIVE
Protein, ur: 30 mg/dL — AB
Specific Gravity, Urine: 1.019 (ref 1.005–1.030)
pH: 5 (ref 5.0–8.0)

## 2022-03-31 LAB — CBC
HCT: 25.5 % — ABNORMAL LOW (ref 36.0–46.0)
Hemoglobin: 9 g/dL — ABNORMAL LOW (ref 12.0–15.0)
MCH: 31 pg (ref 26.0–34.0)
MCHC: 35.3 g/dL (ref 30.0–36.0)
MCV: 87.9 fL (ref 80.0–100.0)
Platelets: 93 10*3/uL — ABNORMAL LOW (ref 150–400)
RBC: 2.9 MIL/uL — ABNORMAL LOW (ref 3.87–5.11)
RDW: 16 % — ABNORMAL HIGH (ref 11.5–15.5)
WBC: 0.9 10*3/uL — CL (ref 4.0–10.5)
nRBC: 0 % (ref 0.0–0.2)

## 2022-03-31 LAB — MAGNESIUM: Magnesium: 1.5 mg/dL — ABNORMAL LOW (ref 1.7–2.4)

## 2022-03-31 LAB — CBC WITH DIFFERENTIAL/PLATELET
Abs Immature Granulocytes: 0.01 10*3/uL (ref 0.00–0.07)
Basophils Absolute: 0 10*3/uL (ref 0.0–0.1)
Basophils Relative: 0 %
Eosinophils Absolute: 0 10*3/uL (ref 0.0–0.5)
Eosinophils Relative: 0 %
HCT: 25.7 % — ABNORMAL LOW (ref 36.0–46.0)
Hemoglobin: 8.9 g/dL — ABNORMAL LOW (ref 12.0–15.0)
Immature Granulocytes: 1 %
Lymphocytes Relative: 52 %
Lymphs Abs: 0.5 10*3/uL — ABNORMAL LOW (ref 0.7–4.0)
MCH: 30.5 pg (ref 26.0–34.0)
MCHC: 34.6 g/dL (ref 30.0–36.0)
MCV: 88 fL (ref 80.0–100.0)
Monocytes Absolute: 0.1 10*3/uL (ref 0.1–1.0)
Monocytes Relative: 6 %
Neutro Abs: 0.4 10*3/uL — CL (ref 1.7–7.7)
Neutrophils Relative %: 41 %
Platelets: 93 10*3/uL — ABNORMAL LOW (ref 150–400)
RBC: 2.92 MIL/uL — ABNORMAL LOW (ref 3.87–5.11)
RDW: 16.1 % — ABNORMAL HIGH (ref 11.5–15.5)
WBC: 1 10*3/uL — CL (ref 4.0–10.5)
nRBC: 0 % (ref 0.0–0.2)

## 2022-03-31 LAB — BASIC METABOLIC PANEL
Anion gap: 7 (ref 5–15)
BUN: 17 mg/dL (ref 8–23)
CO2: 23 mmol/L (ref 22–32)
Calcium: 8.7 mg/dL — ABNORMAL LOW (ref 8.9–10.3)
Chloride: 111 mmol/L (ref 98–111)
Creatinine, Ser: 1.07 mg/dL — ABNORMAL HIGH (ref 0.44–1.00)
GFR, Estimated: 55 mL/min — ABNORMAL LOW (ref >60)
Glucose, Bld: 124 mg/dL — ABNORMAL HIGH (ref 70–99)
Potassium: 2.6 mmol/L — CL (ref 3.5–5.1)
Sodium: 141 mmol/L (ref 135–145)

## 2022-03-31 LAB — HEPATIC FUNCTION PANEL
ALT: 21 U/L (ref 0–44)
AST: 16 U/L (ref 15–41)
Albumin: 4 g/dL (ref 3.5–5.0)
Alkaline Phosphatase: 78 U/L (ref 38–126)
Bilirubin, Direct: 0.2 mg/dL (ref 0.0–0.2)
Indirect Bilirubin: 1 mg/dL — ABNORMAL HIGH (ref 0.3–0.9)
Total Bilirubin: 1.2 mg/dL (ref 0.3–1.2)
Total Protein: 6.5 g/dL (ref 6.5–8.1)

## 2022-03-31 LAB — LACTIC ACID, PLASMA: Lactic Acid, Venous: 1.4 mmol/L (ref 0.5–1.9)

## 2022-03-31 MED ORDER — SODIUM CHLORIDE 0.9% FLUSH
3.0000 mL | Freq: Two times a day (BID) | INTRAVENOUS | Status: DC
  Administered 2022-03-31 – 2022-04-01 (×3): 3 mL via INTRAVENOUS

## 2022-03-31 MED ORDER — MORPHINE SULFATE (PF) 2 MG/ML IV SOLN
2.0000 mg | INTRAVENOUS | Status: DC | PRN
  Administered 2022-04-01: 2 mg via INTRAVENOUS
  Filled 2022-03-31 (×2): qty 1

## 2022-03-31 MED ORDER — LEVOFLOXACIN IN D5W 750 MG/150ML IV SOLN: 750.0000 mg | INTRAVENOUS | Status: DC

## 2022-03-31 MED ORDER — SODIUM CHLORIDE 0.9 % IV SOLN: INTRAVENOUS | Status: DC

## 2022-03-31 MED ORDER — POTASSIUM CHLORIDE IN NACL 20-0.9 MEQ/L-% IV SOLN: INTRAVENOUS | Status: DC

## 2022-03-31 MED ORDER — METOPROLOL TARTRATE 25 MG PO TABS: 25.0000 mg | ORAL_TABLET | ORAL | Status: DC | PRN

## 2022-03-31 MED ORDER — HYDROCODONE-ACETAMINOPHEN 5-325 MG PO TABS
1.0000 | ORAL_TABLET | ORAL | Status: DC | PRN
  Administered 2022-03-31 – 2022-04-01 (×3): 1 via ORAL
  Filled 2022-03-31 (×3): qty 1

## 2022-03-31 MED ORDER — LOPERAMIDE HCL 2 MG PO CAPS: 2.0000 mg | ORAL_CAPSULE | Freq: Two times a day (BID) | ORAL | Status: DC | PRN

## 2022-03-31 MED ORDER — ONDANSETRON 4 MG PO TBDP
4.0000 mg | ORAL_TABLET | Freq: Once | ORAL | Status: AC
  Administered 2022-03-31: 4 mg via ORAL
  Filled 2022-03-31: qty 1

## 2022-03-31 MED ORDER — POTASSIUM CHLORIDE CRYS ER 20 MEQ PO TBCR
40.0000 meq | EXTENDED_RELEASE_TABLET | Freq: Once | ORAL | Status: AC
  Administered 2022-03-31: 40 meq via ORAL
  Filled 2022-03-31: qty 2

## 2022-03-31 MED ORDER — MORPHINE SULFATE (PF) 4 MG/ML IV SOLN
4.0000 mg | Freq: Once | INTRAVENOUS | Status: AC
  Administered 2022-03-31: 4 mg via INTRAVENOUS
  Filled 2022-03-31: qty 1

## 2022-03-31 MED ORDER — ONDANSETRON HCL 4 MG/2ML IJ SOLN
4.0000 mg | Freq: Once | INTRAMUSCULAR | Status: AC
  Administered 2022-03-31: 4 mg via INTRAVENOUS
  Filled 2022-03-31: qty 2

## 2022-03-31 MED ORDER — ACETAMINOPHEN 325 MG PO TABS: 650.0000 mg | ORAL_TABLET | Freq: Four times a day (QID) | ORAL | Status: DC | PRN

## 2022-03-31 MED ORDER — OXYCODONE-ACETAMINOPHEN 5-325 MG PO TABS
1.0000 | ORAL_TABLET | Freq: Once | ORAL | Status: AC
  Administered 2022-03-31: 1 via ORAL
  Filled 2022-03-31: qty 1

## 2022-03-31 MED ORDER — LEVOFLOXACIN IN D5W 250 MG/50ML IV SOLN
250.0000 mg | Freq: Once | INTRAVENOUS | Status: AC
  Administered 2022-03-31: 250 mg via INTRAVENOUS
  Filled 2022-03-31: qty 50

## 2022-03-31 MED ORDER — LEVOFLOXACIN 500 MG PO TABS
500.0000 mg | ORAL_TABLET | Freq: Once | ORAL | Status: AC
  Administered 2022-03-31: 500 mg via ORAL
  Filled 2022-03-31: qty 1

## 2022-03-31 MED ORDER — FAMOTIDINE IN NACL 20-0.9 MG/50ML-% IV SOLN
20.0000 mg | Freq: Two times a day (BID) | INTRAVENOUS | Status: DC
  Administered 2022-03-31 – 2022-04-02 (×4): 20 mg via INTRAVENOUS
  Filled 2022-03-31 (×4): qty 50

## 2022-03-31 MED ORDER — LEVOTHYROXINE SODIUM 25 MCG PO TABS
125.0000 ug | ORAL_TABLET | Freq: Every day | ORAL | Status: DC
  Administered 2022-04-01 – 2022-04-02 (×2): 125 ug via ORAL
  Filled 2022-03-31: qty 3
  Filled 2022-03-31: qty 1

## 2022-03-31 MED ORDER — SODIUM CHLORIDE 0.9 % IV BOLUS
1000.0000 mL | Freq: Once | INTRAVENOUS | Status: AC
  Administered 2022-03-31: 1000 mL via INTRAVENOUS

## 2022-03-31 MED ORDER — VALACYCLOVIR HCL 500 MG PO TABS
500.0000 mg | ORAL_TABLET | Freq: Every day | ORAL | Status: DC
  Administered 2022-04-01 – 2022-04-02 (×2): 500 mg via ORAL
  Filled 2022-03-31 (×2): qty 1

## 2022-03-31 MED ORDER — ACETAMINOPHEN 650 MG RE SUPP: 650.0000 mg | Freq: Four times a day (QID) | RECTAL | Status: DC | PRN

## 2022-03-31 MED ORDER — CLONAZEPAM 0.5 MG PO TABS
1.5000 mg | ORAL_TABLET | Freq: Two times a day (BID) | ORAL | Status: DC
  Administered 2022-03-31: 1 mg via ORAL
  Administered 2022-04-01 – 2022-04-02 (×3): 1.5 mg via ORAL
  Filled 2022-03-31 (×4): qty 3

## 2022-03-31 NOTE — ED Provider Notes (Signed)
[facility] Provider Note    Event Date/Time   First MD Initiated Contact with Patient 03/31/22 1741     (approximate)   History   Flank Pain (/)   HPI  Debra Hale is a 71 y.o. female   Past medical history of leukemia on chemotherapy, kidney stones, breast cancer, colon cancer to the emergency department with sudden onset right flank pain.  No inciting event.  She has had accompanying dysuria.  No fever or chills.  She has had nausea and vomiting.  She is prescribed levofloxacin to take when her [place] is below 500, she states that she has been compliant with this medication.  History was obtained via patient.  I also reviewed external medical notes including a procedure note dated 03/23/2022 when she had a CT-guided aspirate and core biopsy of the right iliac bone.      Physical Exam   Triage Vital Signs: ED Triage Vitals [03/31/22 1627]  Enc Vitals Group     BP 135/82     Pulse Rate 74     Resp 18     Temp 97.7 F (36.5 C)     Temp Source Oral     SpO2 100 %     Weight      Height      Head Circumference      Peak Flow      Pain Score 10     Pain Loc      Pain Edu?      Excl. in [place]?     Most recent vital signs: Vitals:   03/31/22 1627  BP: 135/82  Pulse: 74  Resp: 18  Temp: 97.7 F (36.5 C)  SpO2: 100%    General: Awake, no distress.  CV:  Good peripheral perfusion.  Resp:  Normal effort.  Abd:  No distention.  Other:  Lungs clear to auscultation, abdomen is soft and nontender, she does have right-sided CVA tenderness.  She has no fever.  Normotensive normal heart rate.  Alert and oriented and nontoxic-appearing.   ED Results / Procedures / Treatments   Labs (all labs ordered are listed, but only abnormal results are displayed) Labs Reviewed  URINALYSIS, ROUTINE W REFLEX MICROSCOPIC - Abnormal; Notable for the following components:      Result Value   Color, Urine YELLOW (*)    APPearance HAZY (*)    Hgb urine  dipstick LARGE (*)    Protein, ur 30 (*)    All other components within normal limits  BASIC METABOLIC PANEL - Abnormal; Notable for the following components:   Potassium 2.6 (*)    Glucose, Bld 124 (*)    Creatinine, Ser 1.07 (*)    Calcium 8.7 (*)    GFR, Estimated 55 (*)    All other components within normal limits  CBC - Abnormal; Notable for the following components:   WBC 0.9 (*)    RBC 2.90 (*)    Hemoglobin 9.0 (*)    HCT 25.5 (*)    RDW 16.0 (*)    Platelets 93 (*)    All other components within normal limits  CBC WITH DIFFERENTIAL/PLATELET - Abnormal; Notable for the following components:   WBC 1.0 (*)    RBC 2.92 (*)    Hemoglobin 8.9 (*)    HCT 25.7 (*)    RDW 16.1 (*)    Platelets 93 (*)    Neutro Abs 0.4 (*)    Lymphs Abs 0.5 (*)  All other components within normal limits  PATHOLOGIST SMEAR REVIEW     I reviewed labs and they are notable for she has an elevated creatinine from prior 1.07 and low potassium 2.6 as well as neutropenia WBC 0.9 and diff pending for ANC (of note  ANC from  1 week ago was 300)  EKG  ED ECG REPORT I, Lucillie Garfinkel, the attending physician, personally viewed and interpreted this ECG.   Date: 03/31/2022  EKG Time: 1824  Rate: 79  Rhythm: normal sinus rhythm  Axis: nl  Intervals: wtc 450s  ST&T Change: no acute ischemic changes.    RADIOLOGY I independently reviewed and interpreted CT of the abdomen pelvis and see no obvious ureteral stones, there is some hydronephrosis on the right kidney.   PROCEDURES:  Critical Care performed: No  Procedures   MEDICATIONS ORDERED IN ED: Medications  ondansetron (ZOFRAN-ODT) disintegrating tablet 4 mg (4 mg Oral Given 03/31/22 1639)  oxyCODONE-acetaminophen (PERCOCET/ROXICET) 5-325 MG per tablet 1 tablet (1 tablet Oral Given 03/31/22 1639)  potassium chloride SA (KLOR-CON M) CR tablet 40 mEq (40 mEq Oral Given 03/31/22 1928)  sodium chloride 0.9 % bolus 1,000 mL (1,000 mLs  Intravenous New Bag/Given 03/31/22 1900)  morphine (PF) 4 MG/ML injection 4 mg (4 mg Intravenous Given 03/31/22 1843)  ondansetron (ZOFRAN) injection 4 mg (4 mg Intravenous Given 03/31/22 1842)  levofloxacin (LEVAQUIN) tablet 500 mg (500 mg Oral Given 03/31/22 1928)    Consultants:  I spoke with hospitalist for admission & regarding care plan for this patient.   IMPRESSION / MDM / ASSESSMENT AND PLAN / ED COURSE  I reviewed the triage vital signs and the nursing notes.                              Differential diagnosis includes, but is not limited to, kidney stone, renal colic, pyelonephritis, urinary tract infection, intra-abdominal infection, sepsis, musculoskeletal strain, side effect of chemotherapy   The patient is on the cardiac monitor to evaluate for evidence of arrhythmia and/or significant heart rate changes.  MDM: Patient high risk immunocompromised with neutropenia leukemia on chemotherapy with what is most likely a kidney stone with history of kidney stones and sudden onset right flank pain.  She also has dysuria check for urinary tract infection.  Fortunately her hemodynamics are reassuring at this time normotensive normal heart rate no fever.  Her labs initially show a low white blood cell count of 0.9 and a differential is pending.  She has hypokalemia with a history of the same and she states that she stopped taking her potassium supplements.  Will replete in the emergency department and I advised her to continue taking her potassium supplements.  She also has an elevated creatinine most likely due to mild dehydration in the setting of nausea vomiting today.  Will give IV crystalloid bolus.  This patient will be admitted if she has signs of infection given her neutropenia. We will give IV pain medications and IV antiemetic to try to control symptoms. CT scan shows no signs of obstructing stone but there is right-sided hydronephrosis and ureterectasis, perhaps recently passed  stone.  No obvious signs of intra-abdominal infection on scans as well.  Patient with ongoing nausea, pain despite medications. Admission for hypokalemia, AKI, intractable pain and nausea.  I gave her her Levaquin home medication.  Patient's presentation is most consistent with acute presentation with potential threat to life or bodily function.  FINAL CLINICAL IMPRESSION(S) / ED DIAGNOSES   Final diagnoses:  Right flank pain  Hypokalemia  AKI (acute kidney injury) ([place])  Nausea and vomiting, unspecified vomiting type  Chemotherapy-induced neutropenia ([place])     Rx / DC Orders   ED Discharge Orders     None        Note:  This document was prepared using Dragon voice recognition software and may include unintentional dictation errors.    Lucillie Garfinkel, MD 03/31/22 [phone]

## 2022-03-31 NOTE — ED Notes (Signed)
Critical K+ 2.6 given to Dr. Quentin Cornwall

## 2022-03-31 NOTE — ED Triage Notes (Signed)
Patient to ED from [facility] for right sided flank pain that started approx 1 hr ago. Hx of kidney stones. Actively vomiting in triage due to pain. Patient states she also has leukemia.

## 2022-03-31 NOTE — ED Triage Notes (Signed)
First nurse note: Pt to ED from [facility] c/o right flank pain x20 minutes, dysuria, hx kidney stones with lithotripsy.  [place] vitals 161/69, 87 HR, 98.1, 97% RA.

## 2022-03-31 NOTE — H&P (Signed)
History and Physical    Chief Complaint: Right flank pain.   HISTORY OF PRESENT ILLNESS: Debra Hale is an 71 y.o. female presenting with right flank pain that started today associated with burning with urination.  Patient has a history of kidney stones and has had lithotripsy in the past.  Initial presentation vitals patient is afebrile O2 sats of 97% on room air hypertensive with systolic of 323/55.  Patient also presenting with vomiting and diarrhea.  Patient has history of AML, breast cancer, colon cancer and sees oncology currently receiving treatment for her AML. Patient has a history of pancytopenia and is on prophylactic antibiotic therapy with Levaquin.  Pt has  Past Medical History:  Diagnosis Date   Abdominal pain 03/06/2016   Acute pericarditis 05/31/2013   Anxiety    Arthritis    Osteoarthritis   Breast cancer ([place]) 2009   right breast lumpectomy with rad tx   Colon cancer ([place])    surgery with chemo and rad tx   Complication of anesthesia    GERD (gastroesophageal reflux disease)    Heart palpitations    History of hiatal hernia    History of kidney stones    History of thyroid cancer 09/18/2008   Qualifier: Diagnosis of   By: Orville Govern CMA, Carol       Hypothyroidism    Liver disease    Liver nodule    s/p negative biopsy   Malignant neoplasm of thyroid gland ([place]) 2002   s/p surgery and XRT   Osteoporosis    Other and unspecified hyperlipidemia    Palpitations    Personal history of chemotherapy    Personal history of malignant neoplasm of large intestine    carcinoma - cecum, s/p right laparoscopic colectomy - s/p chemotherapy and XRT   Personal history of radiation therapy    Pneumonia 2019   PONV (postoperative nausea and vomiting)    Pure hypercholesterolemia    Unspecified hereditary and idiopathic peripheral neuropathy    Review of Systems  Gastrointestinal:  Positive for diarrhea, nausea and vomiting.  Genitourinary:  Positive for dysuria and  flank pain.  All other systems reviewed and are negative.  Allergies  Allergen Reactions   Demeclocycline Other (See Comments)    Throat swells   Sulfa Antibiotics Other (See Comments)    Other reaction(s): Other (See Comments) Throat swells Other reaction(s): Other (See Comments) Throat swells Throat swells Other reaction(s): Other (See Comments) Throat swells Other reaction(s): Unknown Other reaction(s): Other (See Comments) Throat swells Other reaction(s): Other (See Comments) Other reaction(s): Other (See Comments) Throat swellsOther reaction(s): Other (See Comments) Throat swells   Tetracyclines & Related Other (See Comments)    Throat swells    Levofloxacin Diarrhea    Severe diarrhea   Augmentin [Amoxicillin-Pot Clavulanate] Diarrhea   Bentyl [Dicyclomine Hcl]     unkn   Ciprofloxacin Diarrhea   Codeine Other (See Comments)    dizziness     Dicyclomine Hcl Other (See Comments)    unkn   Epinephrine     Speeds heart up - panic    Flagyl [Metronidazole] Nausea And Vomiting   Librax [Chlordiazepoxide-Clidinium]     unkn   Novocain [Procaine] Other (See Comments)    "Shaky"   Phenobarbital     unkn   Prednisone     Nervous     Ultram [Tramadol] Other (See Comments)    Sick feeling   Past Surgical History:  Procedure Laterality Date   APPENDECTOMY  1985  BREAST BIOPSY Right 2009   positive   BREAST BIOPSY Right 2009   negative   BREAST LUMPECTOMY Right 2009   breast cancer   CHOLECYSTECTOMY  1995   COLONOSCOPY     COLONOSCOPY WITH PROPOFOL N/A 10/29/2017   Procedure: COLONOSCOPY WITH PROPOFOL;  Surgeon: Manya Silvas, MD;  Location[facility] ENDOSCOPY;  Service: Endoscopy;  Laterality: N/A;   DILATION AND CURETTAGE OF UTERUS  1990   DILATION AND CURETTAGE, DIAGNOSTIC / THERAPEUTIC  1990   ESOPHAGOGASTRODUODENOSCOPY     ESOPHAGOGASTRODUODENOSCOPY (EGD) WITH PROPOFOL N/A 10/29/2017   Procedure: ESOPHAGOGASTRODUODENOSCOPY (EGD) WITH PROPOFOL;   Surgeon: Manya Silvas, MD;  Location: [facility] ENDOSCOPY;  Service: Endoscopy;  Laterality: N/A;   INCISION AND DRAINAGE PERIRECTAL ABSCESS N/A 06/17/2020   Procedure: IRRIGATION AND DEBRIDEMENT PERIRECTAL ABSCESS;  Surgeon: Jules Husbands, MD;  Location: [facility] ORS;  Service: General;  Laterality: N/A;   IR BONE MARROW BIOPSY & ASPIRATION  07/09/2021   LAPAROSCOPIC PARTIAL COLECTOMY     stage 3-C carcinoma of the cecum, s/p chemotherapy and xrt   LITHOTRIPSY     SIGMOIDOSCOPY  08/26/1993   THYROID LOBECTOMY  2002   s/p XRT   TOTAL HIP ARTHROPLASTY Right 10/24/2019   Procedure: TOTAL HIP ARTHROPLASTY;  Surgeon: Corky Mull, MD;  Location: [facility] ORS;  Service: Orthopedics;  Laterality: Right;     CURRENT MEDS: Current Outpatient Medications  Medication Instructions   Azelastine-Fluticasone 137-50 MCG/ACT SUSP SPRAY 2 SPRAYS INTO EACH NOSTRIL EVERY DAY   clonazePAM (KLONOPIN) 1.5 mg, Oral, 2 times daily   clotrimazole (LOTRIMIN) 1 % cream 1 application , Topical, 2 times daily   diphenoxylate-atropine (LOMOTIL) 2.5-0.025 MG tablet 1 tablet, Oral, 4 times daily PRN   levothyroxine (SYNTHROID) 125 MCG tablet TAKE 1 TABLET EVERY DAY ON EMPTY STOMACHWITH A GLASS OF WATER AT LEAST 30-60 MINBEFORE BREAKFAST   Lexapro 20 mg, Oral, BH-each morning   loperamide (IMODIUM) 2 MG capsule Oral   metoprolol tartrate (LOPRESSOR) 25 mg, Oral, As needed   ondansetron (ZOFRAN) 8 mg, Oral, 2 times daily PRN   ondansetron (ZOFRAN) 4-8 mg, Oral, Every 8 hours PRN   pantoprazole (PROTONIX) 40 MG tablet TAKE ONE TABLET (40 MG) BY MOUTH EVERY DAY   potassium chloride SA (KLOR-CON M) 20 MEQ tablet 20 mEq, Oral, Daily   valACYclovir (VALTREX) 500 mg, Oral, Daily   Venclexta 400 mg, Oral, Daily, Take for 14 days, then hold for 14 days. Repeat every 28 days. Take with a meal and a full glass of water.     ondansetron  8 mg Oral Once   ED Course: Pt in Ed alert awake oriented afebrile with O2 sats of 100% on room  air. Vitals:   03/31/22 1627  BP: 135/82  Pulse: 74  Resp: 18  Temp: 97.7 F (36.5 C)  TempSrc: Oral  SpO2: 100%   No intake/output data recorded. SpO2: 100 % Blood work in ed shows: Hypokalemia, pancytopenia, hematuria.  CT scan done in the emergency room shows New right hydronephrosis and ureterectasis down to the level of the ureteral orifice without visible obstructing Calculus.Bilateral nephrolithiasis. Results for orders placed or performed during the hospital encounter of 03/31/22 (from the past 24 hour(s))  Basic metabolic panel     Status: Abnormal   Collection Time: 03/31/22  4:28 PM  Result Value Ref Range   Sodium 141 135 - 145 mmol/L   Potassium 2.6 (LL) 3.5 - 5.1 mmol/L   Chloride 111 98 - 111  mmol/L   CO2 23 22 - 32 mmol/L   Glucose, Bld 124 (H) 70 - 99 mg/dL   BUN 17 8 - 23 mg/dL   Creatinine, Ser 1.07 (H) 0.44 - 1.00 mg/dL   Calcium 8.7 (L) 8.9 - 10.3 mg/dL   GFR, Estimated 55 (L) >60 mL/min   Anion gap 7 5 - 15  CBC     Status: Abnormal   Collection Time: 03/31/22  4:28 PM  Result Value Ref Range   WBC 0.9 (LL) 4.0 - 10.5 K/uL   RBC 2.90 (L) 3.87 - 5.11 MIL/uL   Hemoglobin 9.0 (L) 12.0 - 15.0 g/dL   HCT 25.5 (L) 36.0 - 46.0 %   MCV 87.9 80.0 - 100.0 fL   MCH 31.0 26.0 - 34.0 pg   MCHC 35.3 30.0 - 36.0 g/dL   RDW 16.0 (H) 11.5 - 15.5 %   Platelets 93 (L) 150 - 400 K/uL   nRBC 0.0 0.0 - 0.2 %  Urinalysis, Routine w reflex microscopic     Status: Abnormal   Collection Time: 03/31/22  4:30 PM  Result Value Ref Range   Color, Urine YELLOW (A) YELLOW   APPearance HAZY (A) CLEAR   Specific Gravity, Urine 1.019 1.005 - 1.030   pH 5.0 5.0 - 8.0   Glucose, UA NEGATIVE NEGATIVE mg/dL   Hgb urine dipstick LARGE (A) NEGATIVE   Bilirubin Urine NEGATIVE NEGATIVE   Ketones, ur NEGATIVE NEGATIVE mg/dL   Protein, ur 30 (A) NEGATIVE mg/dL   Nitrite NEGATIVE NEGATIVE   Leukocytes,Ua NEGATIVE NEGATIVE   RBC / HPF 21-50 0 - 5 RBC/hpf   WBC, UA 0-5 0 - 5 WBC/hpf    Bacteria, UA NONE SEEN NONE SEEN   Squamous Epithelial / LPF 0-5 0 - 5   Mucus PRESENT   CBC with Differential/Platelet     Status: Abnormal   Collection Time: 03/31/22  4:30 PM  Result Value Ref Range   WBC 1.0 (LL) 4.0 - 10.5 K/uL   RBC 2.92 (L) 3.87 - 5.11 MIL/uL   Hemoglobin 8.9 (L) 12.0 - 15.0 g/dL   HCT 25.7 (L) 36.0 - 46.0 %   MCV 88.0 80.0 - 100.0 fL   MCH 30.5 26.0 - 34.0 pg   MCHC 34.6 30.0 - 36.0 g/dL   RDW 16.1 (H) 11.5 - 15.5 %   Platelets 93 (L) 150 - 400 K/uL   nRBC 0.0 0.0 - 0.2 %   Neutrophils Relative % 41 %   Neutro Abs 0.4 (LL) 1.7 - 7.7 K/uL   Lymphocytes Relative 52 %   Lymphs Abs 0.5 (L) 0.7 - 4.0 K/uL   Monocytes Relative 6 %   Monocytes Absolute 0.1 0.1 - 1.0 K/uL   Eosinophils Relative 0 %   Eosinophils Absolute 0.0 0.0 - 0.5 K/uL   Basophils Relative 0 %   Basophils Absolute 0.0 0.0 - 0.1 K/uL   WBC Morphology MORPHOLOGY UNREMARKABLE    Smear Review MORPHOLOGY UNREMARKABLE    Immature Granulocytes 1 %   Abs Immature Granulocytes 0.01 0.00 - 0.07 K/uL   Polychromasia PRESENT    *Note: Due to a large number of results and/or encounters for the requested time period, some results have not been displayed. A complete set of results can be found in Results Review.   Unresulted Labs (From admission, onward)     Start     Ordered   03/31/22 2001  Hepatic function panel  Add-on,   AD  03/31/22 2000   03/31/22 1630  Pathologist smear review  Once,   R        03/31/22 1630           Pt has received : Meds ordered this encounter  Medications   ondansetron (ZOFRAN-ODT) disintegrating tablet 4 mg   oxyCODONE-acetaminophen (PERCOCET/ROXICET) 5-325 MG per tablet 1 tablet   potassium chloride SA (KLOR-CON M) CR tablet 40 mEq   sodium chloride 0.9 % bolus 1,000 mL   morphine (PF) 4 MG/ML injection 4 mg   ondansetron (ZOFRAN) injection 4 mg   levofloxacin (LEVAQUIN) tablet 500 mg   Orders Placed This Encounter  Procedures   CT Renal Stone  Study    Standing Status:   Standing    Number of Occurrences:   1    Order Specific Question:   Radiology Contrast Protocol - do NOT remove file path    Answer:   [email or web address]   Urinalysis, Routine w reflex microscopic    May I&O cath if menses    Standing Status:   Standing    Number of Occurrences:   1   Basic metabolic panel    Standing Status:   Standing    Number of Occurrences:   1   CBC    Standing Status:   Standing    Number of Occurrences:   1   CBC with Differential/Platelet    Standing Status:   Standing    Number of Occurrences:   1   Pathologist smear review    Standing Status:   Standing    Number of Occurrences:   1   Hepatic function panel    Standing Status:   Standing    Number of Occurrences:   1   Consult to hospitalist    Standing Status:   Standing    Number of Occurrences:   1    Order Specific Question:   Place call to:    Answer:   [phone]    Order Specific Question:   Reason for Consult    Answer:   Admit   ED EKG    Standing Status:   Standing    Number of Occurrences:   1    Order Specific Question:   Reason for Exam    Answer:   Other (See Comments)     Admission Imaging : CT Renal Stone Study  Result Date: 03/31/2022 CLINICAL DATA:  Abdominal/flank pain, stone suspected EXAM: CT ABDOMEN AND PELVIS WITHOUT CONTRAST TECHNIQUE: Multidetector CT imaging of the abdomen and pelvis was performed following the standard protocol without IV contrast. RADIATION DOSE REDUCTION: This exam was performed according to the departmental dose-optimization program which includes automated exposure control, adjustment of the mA and/or kV according to patient size and/or use of iterative reconstruction technique. COMPARISON:  06/16/2020 FINDINGS: Lower chest: No pleural or pericardial effusion. Hepatobiliary: 2.3 cm low-attenuation lesion in hepatic segment 2 (Im18,Se4), in retrospect possibly present as a region of of  a enhancement 1.7 cm on 07/21/2017 suggesting hemangioma although nonspecific. No other liver lesions. Cholecystectomy clips. Pancreas: Unremarkable. No pancreatic ductal dilatation or surrounding inflammatory changes. Spleen: Normal in size without focal abnormality. Adrenals/Urinary Tract: No adrenal mass. Bilateral urolithiasis, largest stone in the left upper pole 8 mm, on the right 6 mm. There is new right hydronephrosis and ureterectasis down to the level of the ureteral orifice without visible obstructing Calculus.urinary bladder is physiologically distended. Stomach/Bowel: Small hiatal hernia. Stomach incompletely distended. Small bowel decompressed.  Changes of right hemicolectomy with staple line in the mid abdomen. Remaining left colon unremarkable. Vascular/Lymphatic: Mild scattered aortoiliac calcified plaque. No abdominal or pelvic adenopathy. Reproductive: Uterus and bilateral adnexa are unremarkable. Other: Right pelvic phleboliths.  No ascites.  No free air. Musculoskeletal: Right hip arthroplasty projects in expected location, incompletely visualized. Mild left hip DJD. No acute findings. IMPRESSION: 1. New right hydronephrosis and ureterectasis down to the level of the ureteral orifice without visible obstructing Calculus. 2. Bilateral nephrolithiasis. 3. 2.3 cm low-attenuation lesion in hepatic segment 2, possibly hemangioma but nonspecific. Consider elective outpatient MR liver protocol with contrast for further characterization. 4. Small hiatal hernia. Aortic Atherosclerosis (ICD10-I70.0). Electronically Signed   By: Lucrezia Europe M.D.   On: 03/31/2022 17:19   Physical Examination: Vitals:   03/31/22 1627  BP: 135/82  Pulse: 74  Temp: 97.7 F (36.5 C)  Resp: 18  SpO2: 100%  TempSrc: Oral   Physical Exam Vitals and nursing note reviewed.  Constitutional:      General: She is not in acute distress.    Appearance: Normal appearance. She is not ill-appearing, toxic-appearing or  diaphoretic.  HENT:     Head: Normocephalic and atraumatic.     Right Ear: Hearing and external ear normal.     Left Ear: Hearing and external ear normal.     Nose: Nose normal. No nasal deformity.     Mouth/Throat:     Lips: Pink.     Mouth: Mucous membranes are moist.     Tongue: No lesions.     Pharynx: Oropharynx is clear.  Eyes:     Extraocular Movements: Extraocular movements intact.     Pupils: Pupils are equal, round, and reactive to light.  Neck:     Vascular: No carotid bruit.  Cardiovascular:     Rate and Rhythm: Normal rate and regular rhythm.     Pulses: Normal pulses.     Heart sounds: Normal heart sounds.  Pulmonary:     Effort: Pulmonary effort is normal.     Breath sounds: Normal breath sounds.  Abdominal:     General: Bowel sounds are normal. There is no distension.     Palpations: Abdomen is soft. There is no mass.     Tenderness: There is no abdominal tenderness. There is no guarding.     Hernia: No hernia is present.  Musculoskeletal:     Right lower leg: No edema.     Left lower leg: No edema.  Skin:    General: Skin is warm.  Neurological:     General: No focal deficit present.     Mental Status: She is alert and oriented to person, place, and time.     Cranial Nerves: Cranial nerves 2-12 are intact.     Motor: Motor function is intact.  Psychiatric:        Attention and Perception: Attention normal.        Mood and Affect: Mood normal.        Speech: Speech normal.        Behavior: Behavior normal. Behavior is cooperative.        Cognition and Memory: Cognition normal.       Assessment and Plan: No notes have been filed under this hospital service. Service: Hospitalist          DVT prophylaxis:  ***  Code Status:  ***  Family Communication:  ***  Disposition Plan:  ***  Consults called:  *** Admission status: ***  Unit/ Expected LOS: ***   Para Skeans MD [name]  6 PM- 2 AM. Please contact me via secure  Chat 6 PM-2 AM. [phone]( Pager ) To contact the [facility] Attending or Consulting provider [place] or covering provider during after hours [place], for this patient.   Check the care team in [facility] and look for a) attending/consulting [facility] provider listed and b) the [facility] team listed Log into [email or web address] and use [place] universal password to access. If you do not have the password, please contact the hospital operator. Locate the [facility] provider you are looking for under [name] and page to a number that you can be directly reached. If you still have difficulty reaching the provider, please page the [facility] (Director on Call) for the Hospitalists listed on amion for assistance. [email or web address] 03/31/2022, 8:04 PM

## 2022-03-31 NOTE — ED Provider Triage Note (Signed)
Emergency Medicine Provider Triage Evaluation Note  Debra Hale , a 71 y.o. female  was evaluated in triage.  Pt complains of right flank pain with nausea and vomiting that started about an hour ago. History of kidney stones. Feels the same.   Physical Exam  BP 135/82 (BP Location: Left Arm)   Pulse 74   Temp 97.7 F (36.5 C) (Oral)   Resp 18   SpO2 100%  Gen:   Awake, no distress   Resp:  Normal effort  MSK:   Moves extremities without difficulty  Other:    Medical Decision Making  Medically screening exam initiated at 4:36 PM.  Appropriate orders placed.  CARLY SABO was informed that the remainder of the evaluation will be completed by another provider, this initial triage assessment does not replace that evaluation, and the importance of remaining in the ED until their evaluation is complete.  CT renal stone study ordered as well as nausea and pain medication.   Victorino Dike, FNP 03/31/22 [phone]

## 2022-03-31 NOTE — ED Notes (Addendum)
Pt family member came to this NT and stated that Pt when vomiting had a bowl movement on herself. This NT and RN cleaned up and put new gown and brief on Pt. Was provided 2 warm blankets no other needs at this time. MD aware of incontinent episode.

## 2022-04-01 ENCOUNTER — Encounter: Payer: Self-pay | Admitting: Internal Medicine

## 2022-04-01 DIAGNOSIS — D61818 Other pancytopenia: Secondary | ICD-10-CM | POA: Diagnosis present

## 2022-04-01 DIAGNOSIS — C9200 Acute myeloblastic leukemia, not having achieved remission: Secondary | ICD-10-CM | POA: Diagnosis present

## 2022-04-01 DIAGNOSIS — D849 Immunodeficiency, unspecified: Secondary | ICD-10-CM | POA: Diagnosis present

## 2022-04-01 DIAGNOSIS — Z87442 Personal history of urinary calculi: Secondary | ICD-10-CM | POA: Diagnosis not present

## 2022-04-01 DIAGNOSIS — K219 Gastro-esophageal reflux disease without esophagitis: Secondary | ICD-10-CM | POA: Diagnosis present

## 2022-04-01 DIAGNOSIS — Z85038 Personal history of other malignant neoplasm of large intestine: Secondary | ICD-10-CM | POA: Diagnosis not present

## 2022-04-01 DIAGNOSIS — Z85850 Personal history of malignant neoplasm of thyroid: Secondary | ICD-10-CM | POA: Diagnosis not present

## 2022-04-01 DIAGNOSIS — E039 Hypothyroidism, unspecified: Secondary | ICD-10-CM | POA: Diagnosis present

## 2022-04-01 DIAGNOSIS — N179 Acute kidney failure, unspecified: Secondary | ICD-10-CM | POA: Diagnosis present

## 2022-04-01 DIAGNOSIS — T451X5A Adverse effect of antineoplastic and immunosuppressive drugs, initial encounter: Secondary | ICD-10-CM | POA: Diagnosis present

## 2022-04-01 DIAGNOSIS — N136 Pyonephrosis: Secondary | ICD-10-CM | POA: Diagnosis present

## 2022-04-01 DIAGNOSIS — Z882 Allergy status to sulfonamides status: Secondary | ICD-10-CM | POA: Diagnosis not present

## 2022-04-01 DIAGNOSIS — Z881 Allergy status to other antibiotic agents status: Secondary | ICD-10-CM | POA: Diagnosis not present

## 2022-04-01 DIAGNOSIS — Z853 Personal history of malignant neoplasm of breast: Secondary | ICD-10-CM | POA: Diagnosis not present

## 2022-04-01 DIAGNOSIS — F419 Anxiety disorder, unspecified: Secondary | ICD-10-CM | POA: Diagnosis present

## 2022-04-01 DIAGNOSIS — Z9049 Acquired absence of other specified parts of digestive tract: Secondary | ICD-10-CM | POA: Diagnosis not present

## 2022-04-01 DIAGNOSIS — D701 Agranulocytosis secondary to cancer chemotherapy: Secondary | ICD-10-CM | POA: Diagnosis present

## 2022-04-01 DIAGNOSIS — E7800 Pure hypercholesterolemia, unspecified: Secondary | ICD-10-CM | POA: Diagnosis present

## 2022-04-01 DIAGNOSIS — Z96641 Presence of right artificial hip joint: Secondary | ICD-10-CM | POA: Diagnosis present

## 2022-04-01 DIAGNOSIS — M810 Age-related osteoporosis without current pathological fracture: Secondary | ICD-10-CM | POA: Diagnosis present

## 2022-04-01 DIAGNOSIS — N390 Urinary tract infection, site not specified: Secondary | ICD-10-CM | POA: Diagnosis present

## 2022-04-01 DIAGNOSIS — R109 Unspecified abdominal pain: Secondary | ICD-10-CM | POA: Diagnosis not present

## 2022-04-01 DIAGNOSIS — Z888 Allergy status to other drugs, medicaments and biological substances status: Secondary | ICD-10-CM | POA: Diagnosis not present

## 2022-04-01 DIAGNOSIS — E860 Dehydration: Secondary | ICD-10-CM | POA: Diagnosis present

## 2022-04-01 DIAGNOSIS — E876 Hypokalemia: Secondary | ICD-10-CM | POA: Diagnosis present

## 2022-04-01 LAB — COMPREHENSIVE METABOLIC PANEL
ALT: 20 U/L (ref 0–44)
AST: 13 U/L — ABNORMAL LOW (ref 15–41)
Albumin: 3.2 g/dL — ABNORMAL LOW (ref 3.5–5.0)
Alkaline Phosphatase: 69 U/L (ref 38–126)
Anion gap: 5 (ref 5–15)
BUN: 16 mg/dL (ref 8–23)
CO2: 19 mmol/L — ABNORMAL LOW (ref 22–32)
Calcium: 7.8 mg/dL — ABNORMAL LOW (ref 8.9–10.3)
Chloride: 113 mmol/L — ABNORMAL HIGH (ref 98–111)
Creatinine, Ser: 1.22 mg/dL — ABNORMAL HIGH (ref 0.44–1.00)
GFR, Estimated: 47 mL/min — ABNORMAL LOW (ref >60)
Glucose, Bld: 114 mg/dL — ABNORMAL HIGH (ref 70–99)
Potassium: 3.7 mmol/L (ref 3.5–5.1)
Sodium: 137 mmol/L (ref 135–145)
Total Bilirubin: 1.3 mg/dL — ABNORMAL HIGH (ref 0.3–1.2)
Total Protein: 5.3 g/dL — ABNORMAL LOW (ref 6.5–8.1)

## 2022-04-01 LAB — CBC
HCT: 23.3 % — ABNORMAL LOW (ref 36.0–46.0)
Hemoglobin: 7.8 g/dL — ABNORMAL LOW (ref 12.0–15.0)
MCH: 30.1 pg (ref 26.0–34.0)
MCHC: 33.5 g/dL (ref 30.0–36.0)
MCV: 90 fL (ref 80.0–100.0)
Platelets: 67 K/uL — ABNORMAL LOW (ref 150–400)
RBC: 2.59 MIL/uL — ABNORMAL LOW (ref 3.87–5.11)
RDW: 16.2 % — ABNORMAL HIGH (ref 11.5–15.5)
WBC: 0.9 K/uL — CL (ref 4.0–10.5)
nRBC: 0 % (ref 0.0–0.2)

## 2022-04-01 LAB — PATHOLOGIST SMEAR REVIEW

## 2022-04-01 MED ORDER — MAGNESIUM SULFATE 2 GM/50ML IV SOLN
2.0000 g | Freq: Once | INTRAVENOUS | Status: AC
  Administered 2022-04-01: 2 g via INTRAVENOUS
  Filled 2022-04-01: qty 50

## 2022-04-01 MED ORDER — SODIUM CHLORIDE 0.9 % IV SOLN
2.0000 g | INTRAVENOUS | Status: DC
  Administered 2022-04-01 – 2022-04-02 (×2): 2 g via INTRAVENOUS
  Filled 2022-04-01: qty 20
  Filled 2022-04-01: qty 2

## 2022-04-01 MED ORDER — OXYCODONE-ACETAMINOPHEN 5-325 MG PO TABS: 1.0000 | ORAL_TABLET | Freq: Four times a day (QID) | ORAL | Status: DC | PRN

## 2022-04-01 NOTE — Progress Notes (Signed)
PROGRESS NOTE    Debra Hale  NTI:[id number] DOB: 06-17-1950 DOA: 03/31/2022 PCP: Pcp, No    Brief Narrative:  71 year old with significant history of nephrolithiasis, AML, breast cancer, colon cancer currently on treatment for AML presented with severe right flank pain and burning urination of 1 day.  She does have history of chronic neutropenia and on Levaquin prophylaxis at home.  In the emergency room, afebrile.  Blood pressure stable.  On room air.  Urine grossly abnormal.  WC count 1000 with neutrophils of 0.3.  Patient was started on IV fluids, pain medications and continued on Levaquin from home.   Assessment & Plan:   Acute right flank pain with hydronephrosis, no obstructive stone.  Likely recently passed stone. Acute UTI present on admission in a patient with severe thrombocytopenia.  ANC count of 300. -Adequately resuscitated. -Urine culture and blood cultures were not collected on admission, collected today. -Discontinue Levaquin, will start on Rocephin 2 g daily until final cultures. -Adequate pain medications and mobility.  Urine output monitoring. -Unlikely patient will need any urological intervention as she is already improving and there is no reversible cause for obstruction.  AML with severe pancytopenia: Neutropenic precautions.  We will update her oncologist to see if she needs any intervention as inpatient.  Hypokalemia: Severe and persistent.  Replaced with improvement.  Recheck tomorrow morning.  Hypomagnesemia: Replaced.  Recheck levels.  Hypothyroidism: On Synthroid.    DVT prophylaxis: SCDs Start: 03/31/22 2012   Code Status: Full code Family Communication: None at the bedside Disposition Plan: Status is: Observation The patient will require care spanning > 2 midnights and should be moved to inpatient because: IV antibiotics, fluid, pain management.     Consultants:  Oncology, notified  Procedures:  None  Antimicrobials:  Levaquin from  home Rocephin 11/29---   Subjective: Patient was seen and examined.  She tells me that her pain is better than yesterday but still has moderate pain remaining.  Denies any nausea vomiting.  Able to eat. Patient tells me that the pain medication she was given last night was working better than today.  Apparently she received a dose of Percocet at night.  Remains afebrile.  Blood pressures are adequate.  Objective: Vitals:   04/01/22 0930 04/01/22 1030 04/01/22 1100 04/01/22 1130  BP: (!) 125/58 (!) 120/57 (!) 114/57 (!) 106/51  Pulse: 65 65 64 70  Resp:    15  Temp:   97.9 F (36.6 C)   TempSrc:   Oral   SpO2: 98% 96% 98% 97%    Intake/Output Summary (Last 24 hours) at 04/01/2022 1304 Last data filed at 04/01/2022 1208 Gross per 24 hour  Intake 200 ml  Output --  Net 200 ml   There were no vitals filed for this visit.  Examination:  General exam: Appears calm and comfortable  Frail and debilitated.  Not in any distress. Respiratory system: Clear to auscultation. Respiratory effort normal. Cardiovascular system: S1 & S2 heard, RRR. No JVD, murmurs, rubs, gallops or clicks. No pedal edema. Gastrointestinal system: Abdomen is nondistended, soft and nontender. No organomegaly or masses felt. Normal bowel sounds heard. Central nervous system: Alert and oriented. No focal neurological deficits.     Data Reviewed: I have personally reviewed following labs and imaging studies  CBC: Recent Labs  Lab 03/31/22 1628 03/31/22 1630 04/01/22 0446  WBC 0.9* 1.0* 0.9*  NEUTROABS  --  0.4*  --   HGB 9.0* 8.9* 7.8*  HCT 25.5* 25.7* 23.3*  MCV 87.9 88.0 90.0  PLT 93* 93* 67*   Basic Metabolic Panel: Recent Labs  Lab 03/31/22 1628 03/31/22 1630 04/01/22 0446  NA 141  --  137  K 2.6*  --  3.7  CL 111  --  113*  CO2 23  --  19*  GLUCOSE 124*  --  114*  BUN 17  --  16  CREATININE 1.07*  --  1.22*  CALCIUM 8.7*  --  7.8*  MG  --  1.5*  --    GFR: Estimated Creatinine  Clearance: 36 mL/min (A) (by C-G formula based on SCr of 1.22 mg/dL (H)). Liver Function Tests: Recent Labs  Lab 03/31/22 1630 04/01/22 0446  AST 16 13*  ALT 21 20  ALKPHOS 78 69  BILITOT 1.2 1.3*  PROT 6.5 5.3*  ALBUMIN 4.0 3.2*   No results for input(s): "LIPASE", "AMYLASE" in the last 168 hours. No results for input(s): "AMMONIA" in the last 168 hours. Coagulation Profile: No results for input(s): "INR", "PROTIME" in the last 168 hours. Cardiac Enzymes: No results for input(s): "CKTOTAL", "CKMB", "CKMBINDEX", "TROPONINI" in the last 168 hours. BNP (last 3 results) No results for input(s): "PROBNP" in the last 8760 hours. HbA1C: No results for input(s): "HGBA1C" in the last 72 hours. CBG: No results for input(s): "GLUCAP" in the last 168 hours. Lipid Profile: No results for input(s): "CHOL", "HDL", "LDLCALC", "TRIG", "CHOLHDL", "LDLDIRECT" in the last 72 hours. Thyroid Function Tests: No results for input(s): "TSH", "T4TOTAL", "FREET4", "T3FREE", "THYROIDAB" in the last 72 hours. Anemia Panel: No results for input(s): "VITAMINB12", "FOLATE", "FERRITIN", "TIBC", "IRON", "RETICCTPCT" in the last 72 hours. Sepsis Labs: Recent Labs  Lab 03/31/22 2048  LATICACIDVEN 1.4    No results found for this or any previous visit (from the past 240 hour(s)).       Radiology Studies: CT Renal Stone Study  Result Date: 03/31/2022 CLINICAL DATA:  Abdominal/flank pain, stone suspected EXAM: CT ABDOMEN AND PELVIS WITHOUT CONTRAST TECHNIQUE: Multidetector CT imaging of the abdomen and pelvis was performed following the standard protocol without IV contrast. RADIATION DOSE REDUCTION: This exam was performed according to the departmental dose-optimization program which includes automated exposure control, adjustment of the mA and/or kV according to patient size and/or use of iterative reconstruction technique. COMPARISON:  06/16/2020 FINDINGS: Lower chest: No pleural or pericardial  effusion. Hepatobiliary: 2.3 cm low-attenuation lesion in hepatic segment 2 (Im18,Se4), in retrospect possibly present as a region of of a enhancement 1.7 cm on 07/21/2017 suggesting hemangioma although nonspecific. No other liver lesions. Cholecystectomy clips. Pancreas: Unremarkable. No pancreatic ductal dilatation or surrounding inflammatory changes. Spleen: Normal in size without focal abnormality. Adrenals/Urinary Tract: No adrenal mass. Bilateral urolithiasis, largest stone in the left upper pole 8 mm, on the right 6 mm. There is new right hydronephrosis and ureterectasis down to the level of the ureteral orifice without visible obstructing Calculus.urinary bladder is physiologically distended. Stomach/Bowel: Small hiatal hernia. Stomach incompletely distended. Small bowel decompressed. Changes of right hemicolectomy with staple line in the mid abdomen. Remaining left colon unremarkable. Vascular/Lymphatic: Mild scattered aortoiliac calcified plaque. No abdominal or pelvic adenopathy. Reproductive: Uterus and bilateral adnexa are unremarkable. Other: Right pelvic phleboliths.  No ascites.  No free air. Musculoskeletal: Right hip arthroplasty projects in expected location, incompletely visualized. Mild left hip DJD. No acute findings. IMPRESSION: 1. New right hydronephrosis and ureterectasis down to the level of the ureteral orifice without visible obstructing Calculus. 2. Bilateral nephrolithiasis. 3. 2.3 cm low-attenuation lesion in hepatic  segment 2, possibly hemangioma but nonspecific. Consider elective outpatient MR liver protocol with contrast for further characterization. 4. Small hiatal hernia. Aortic Atherosclerosis (ICD10-I70.0). Electronically Signed   By: Lucrezia Europe M.D.   On: 03/31/2022 17:19        Scheduled Meds:  clonazePAM  1.5 mg Oral BID   levothyroxine  125 mcg Oral Q0600   ondansetron  8 mg Oral Once   sodium chloride flush  3 mL Intravenous Q12H   valACYclovir  500 mg Oral Daily    Continuous Infusions:  sodium chloride 100 mL/hr at 04/01/22 1214   cefTRIAXone (ROCEPHIN)  IV Stopped (04/01/22 1154)   famotidine (PEPCID) IV Stopped (04/01/22 1035)     LOS: 0 days    Time spent: 40 minutes    Barb Merino, MD [name] Pager [phone]

## 2022-04-01 NOTE — Assessment & Plan Note (Signed)
    Latest Ref Rng & Units 03/31/2022    4:30 PM 03/31/2022    4:28 PM 03/23/2022    8:17 AM  CBC  WBC 4.0 - 10.5 K/uL 1.0  0.9  0.7   Hemoglobin 12.0 - 15.0 g/dL 8.9  9.0  9.2   Hematocrit 36.0 - 46.0 % 25.7  25.5  26.8   Platelets 150 - 400 K/uL 93  93  164   Pancytopenia being followed by oncology for patient's AML and patient receiving chemotherapy.  Neutropenic precautions.

## 2022-04-01 NOTE — Assessment & Plan Note (Signed)
IV PPI therapy twice daily or H2 blocker therapy.

## 2022-04-01 NOTE — ED Notes (Signed)
Pt needs to void. Helped pt up to toilet to void and for urine sample. Pt complaining that IV pump was beeping. Will obtain blood cultures once pt back in bed. Daughter at bedside.

## 2022-04-01 NOTE — ED Notes (Signed)
Provided water and crackers, PB and obtained diet order for pt. Pt complaining that did not go directly to room when had first come in last night. Pt had refused synthroid earlier this AM with night nurse. Pt requesting pain meds for flank pain that "is just starting back". Will do.

## 2022-04-01 NOTE — Assessment & Plan Note (Signed)
Will check electrolytes and follow levels and correct them.

## 2022-04-01 NOTE — Assessment & Plan Note (Addendum)
Attribute to her hydronephrosis and kidney stones.  Urology consult per am team.  Cont with pain control / MIVF.  If right-sided flank pain abdominal pain continues we will consider CT of the abdomen and pelvis with contrast.

## 2022-04-01 NOTE — Assessment & Plan Note (Signed)
Hydronephrosis without obstruction however patient's abdominal pain persist we will obtain a CT of the abdomen pelvis with contrast.

## 2022-04-01 NOTE — Assessment & Plan Note (Signed)
Supportive care with antiemetics and antipyretics Imodium A-D IV hydration and rehydration.  Electrolyte repletion and correction and IV PPI.

## 2022-04-01 NOTE — ED Notes (Signed)
Pt requested diet order. Messaged provider.

## 2022-04-01 NOTE — ED Notes (Signed)
Pt was worken up for AM meds by this RN. When asked what her pain level was she stated it was a 7 since she has been moving and hasn't had any pain medication. This RN told pt that she received pain meds less than an hour ago and pt stated "I did?" Told pt what medication she was given and what time it given.

## 2022-04-01 NOTE — Assessment & Plan Note (Addendum)
Currently stable.     Latest Ref Rng & Units 03/31/2022    4:30 PM 03/31/2022    4:28 PM 03/10/2022    1:14 PM  CMP  Glucose 70 - 99 mg/dL  124  109   BUN 8 - 23 mg/dL  17  9   Creatinine 0.44 - 1.00 mg/dL  1.07  0.88   Sodium 135 - 145 mmol/L  141  139   Potassium 3.5 - 5.1 mmol/L  2.6  3.5   Chloride 98 - 111 mmol/L  111  107   CO2 22 - 32 mmol/L  23  23   Calcium 8.9 - 10.3 mg/dL  8.7  8.6   Total Protein 6.5 - 8.1 g/dL 6.5     Total Bilirubin 0.3 - 1.2 mg/dL 1.2     Alkaline Phos 38 - 126 U/L 78     AST 15 - 41 U/L 16     ALT 0 - 44 U/L 21     We will follow and manage. Avoid hepatotoxic meds.

## 2022-04-01 NOTE — Assessment & Plan Note (Signed)
Currently patient continued on Synthroid at 125. Will continue metoprolol 25 mg.

## 2022-04-01 NOTE — Assessment & Plan Note (Signed)
Patient being followed by oncology. Baseline pancytopenia per report.  Patient continued on Levaquin. Reverse isolation or neutropenic precautions.

## 2022-04-01 NOTE — Assessment & Plan Note (Signed)
Microscopic hematuria Attribute to chronic hematuria from kidney stones. Urology consult in a.m.

## 2022-04-01 NOTE — ED Notes (Signed)
Pt lunch tray was left on the nurses station counter. This RN took the lunch tray to pt and got her set up to eat.

## 2022-04-02 LAB — BLOOD CULTURE ID PANEL (REFLEXED) - BCID2

## 2022-04-02 LAB — URINE CULTURE

## 2022-04-02 LAB — COMPREHENSIVE METABOLIC PANEL
ALT: 17 U/L (ref 0–44)
AST: 9 U/L — ABNORMAL LOW (ref 15–41)
Albumin: 3.1 g/dL — ABNORMAL LOW (ref 3.5–5.0)
Alkaline Phosphatase: 68 U/L (ref 38–126)
Anion gap: 2 — ABNORMAL LOW (ref 5–15)
BUN: 16 mg/dL (ref 8–23)
CO2: 24 mmol/L (ref 22–32)
Calcium: 8 mg/dL — ABNORMAL LOW (ref 8.9–10.3)
Chloride: 117 mmol/L — ABNORMAL HIGH (ref 98–111)
Creatinine, Ser: 0.78 mg/dL (ref 0.44–1.00)
GFR, Estimated: >60 mL/min (ref >60)
Glucose, Bld: 106 mg/dL — ABNORMAL HIGH (ref 70–99)
Potassium: 4 mmol/L (ref 3.5–5.1)
Sodium: 143 mmol/L (ref 135–145)
Total Bilirubin: 0.8 mg/dL (ref 0.3–1.2)
Total Protein: 5.3 g/dL — ABNORMAL LOW (ref 6.5–8.1)

## 2022-04-02 LAB — PHOSPHORUS: Phosphorus: 2.9 mg/dL (ref 2.5–4.6)

## 2022-04-02 LAB — MAGNESIUM: Magnesium: 2 mg/dL (ref 1.7–2.4)

## 2022-04-02 LAB — PREPARE RBC (CROSSMATCH)

## 2022-04-02 MED ORDER — CEPHALEXIN 500 MG PO CAPS: 500.0000 mg | ORAL_CAPSULE | Freq: Three times a day (TID) | ORAL | 0 refills | Status: DC

## 2022-04-02 MED ORDER — CLONAZEPAM 0.5 MG PO TABS: 0.5000 mg | ORAL_TABLET | Freq: Three times a day (TID) | ORAL | 0 refills | Status: AC | PRN

## 2022-04-02 MED ORDER — SODIUM CHLORIDE 0.9% IV SOLUTION: Freq: Once | INTRAVENOUS | Status: DC

## 2022-04-02 NOTE — TOC Initial Note (Signed)
Transition of Care [facility]) - Initial/Assessment Note    Patient Details  Name: Debra Hale MRN: [id number] Date of Birth: December 23, 1950  Transition of Care [facility]) CM/SW Contact:    Laurena Slimmer, RN Phone Number: 04/02/2022, 11:57 AM  Clinical Narrative:                Transition of Care [facility]) Screening Note   Patient Details  Name: Debra Hale Date of Birth: 07/30/1950   Transition of Care [facility]) CM/SW Contact:    Laurena Slimmer, RN Phone Number: 04/02/2022, 11:57 AM    Transition of Care Department [facility]) has reviewed patient and no TOC needs have been identified at this time. We will continue to monitor patient advancement through interdisciplinary progression rounds. If new patient transition needs arise, please place a TOC consult.         Patient Goals and CMS Choice        Expected Discharge Plan and Services           Expected Discharge Date: 04/02/22                                    Prior Living Arrangements/Services                       Activities of Daily Living Home Assistive Devices/Equipment: Eyeglasses ADL Screening (condition at time of admission) Patient's cognitive ability adequate to safely complete daily activities?: Yes Is the patient deaf or have difficulty hearing?: No Does the patient have difficulty seeing, even when wearing glasses/contacts?: No Does the patient have difficulty concentrating, remembering, or making decisions?: No Patient able to express need for assistance with ADLs?: Yes Does the patient have difficulty dressing or bathing?: No Independently performs ADLs?: Yes (appropriate for developmental age) Does the patient have difficulty walking or climbing stairs?: Yes Weakness of Legs: Both Weakness of Arms/Hands: None  Permission Sought/Granted                  Emotional Assessment              Admission diagnosis:  Hypokalemia [E87.6] UTI (urinary tract infection) [N39.0] Right  flank pain [R10.9] Chemotherapy-induced neutropenia ([place]) [D70.1, T45.1X5A] AKI (acute kidney injury) ([place]) [N17.9] Nausea vomiting and diarrhea [R11.2, R19.7] Nausea and vomiting, unspecified vomiting type [R11.2] Patient Active Problem List   Diagnosis Date Noted   UTI (urinary tract infection) 04/01/2022   Nausea vomiting and diarrhea 03/31/2022   Electrolyte abnormality 03/31/2022   Hydronephrosis of right kidney 03/31/2022   Vaginal dryness 01/05/2022   Liver nodule 11/18/2021   Major depressive disorder, single episode, 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/01/2021   Perirectal abscess 06/16/2020   Fatty liver disease, nonalcoholic 17/91/5056   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[place]) 04/12/2020   Pancytopenia ([place]) 02/11/2020   Trochanteric bursitis, left hip 02/02/2020   Trochanteric bursitis, right hip 02/02/2020   Status post total hip replacement, 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   Lumbar spondylosis 08/25/2019   Lumbar strain 08/25/2019   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/10/2019   Rectal bleed 06/17/2019   Pleural effusion 12/03/2018   Right hip pain 12/03/2018   Leukopenia 08/21/2018   Change in vision 06/24/2018   Chronic daily headache 05/31/2018   Elevated erythrocyte sedimentation rate 05/31/2018   Hypothyroidism 05/02/2018   Neck pain 05/02/2018   Nonintractable headache 04/18/2018   Thrush 04/18/2018   Pleural effusion associated with  pulmonary infection 04/18/2018   Other fatigue 04/18/2018   Acute right flank pain 03/06/2016   Acute diarrhea 01/10/2016   Neck nodule 10/13/2015   Vitamin D deficiency 07/27/2015   Loose stools 04/14/2015   Health care maintenance 06/17/2014   Flatulence, eructation and gas pain 06/17/2014   Stress 01/28/2014   Acute stress reaction 01/28/2014   Abnormal respiratory rate 06/19/2013   Environmental allergies 02/25/2013   Left elbow pain 02/25/2013   Abnormal liver function test 12/07/2012   Anxiety 05/06/2012   GERD (gastroesophageal reflux disease)  05/06/2012   Osteopenia 05/06/2012   Hematuria 05/06/2012   Osteoporosis 05/06/2012   History of breast cancer 09/18/2008   Hypercholesterolemia 09/18/2008   Chemotherapy-induced neuropathy ([place]) 09/18/2008   Palpitations 09/18/2008   History of malignant neoplasm of large intestine 09/18/2008   Malignant neoplasm of breast (female) ([place]) 09/18/2008   Malignant neoplasm of thyroid gland ([place]) 09/18/2008   Hereditary and idiopathic peripheral neuropathy 09/18/2008   PCP:  Pcp, No Pharmacy:   [place], [place] - [address] [place] [address] Phone: [phone] Fax: [phone]  [id number] [address]Phone: [phone]: [phone] [id number] [place] [address] [place]: [phone]: [address]: [phone]: [phone]   Social Determinants of Health (SDOH) Interventions    Readmission Risk Interventions     No data to display

## 2022-04-02 NOTE — Progress Notes (Signed)
Blood transfusion completed , tolerated well. AVS given to patient verbalized understanding of contents.Discharged to home with Butch Penny friend.

## 2022-04-02 NOTE — Discharge Summary (Signed)
Physician Discharge Summary  Debra Hale UEA:[id number] DOB: 01/24/1951 DOA: 03/31/2022  PCP: Merryl Hacker, No  Admit date: 03/31/2022 Discharge date: 04/02/2022  Admitted From: Home Disposition: Home  Recommendations for Outpatient Follow-up:  Follow up with PCP in 1-2 weeks Oncology clinic will schedule follow-up  Home Health: N/A Equipment/Devices: N/A  Discharge Condition: Stable CODE STATUS: Full code Diet recommendation: Regular diet  Discharge summary: 71 year old with significant history of nephrolithiasis, AML, breast cancer, colon cancer currently on treatment for AML presented with severe right flank pain and burning urination of 1 day.  She does have history of chronic neutropenia. In the emergency room, afebrile.  Blood pressure stable.  On room air.  Urine grossly abnormal.  WBC count 1000 with neutrophils of 0.3.  Patient was started on IV fluids, pain medications and continued on Levaquin from home.  # Acute right flank pain with hydronephrosis, no obstructive stone.  Likely recently passed stone. -Adequately resuscitated.  Symptoms improved. -Urine culture and blood cultures no growth so far.  Rocephin day 2 today. -Symptomatically improved.  Continue Keflex 500 mg 3 times daily for 7 more days, will treat as complicated UTI. -Unlikely patient will need any urological intervention as she is already improving and there is no reversible cause for obstruction.  Renal functions are normal.   AML with severe pancytopenia: Neutropenic precautions.   Discussed with her oncology.  She does have baseline severe thrombocytopenia. Hemoglobin 7, will benefit with 1 unit of PRBC transfusion.  Patient has consented to receive 1 unit of irradiated PRBC.   Electrolytes: Hypokalemia, hypomagnesemia.  Persistent.  Replaced and adequate.     Hypothyroidism: On Synthroid.  Chronic anxiety: Patient on Lexapro and Klonopin.  Verified doses from psychiatry office.  Stable for discharge.     Discharge Diagnoses:  Principal Problem:   Acute right flank pain Active Problems:   Hydronephrosis of right kidney   Hematuria   Pancytopenia (HCC)   Nausea vomiting and diarrhea   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HCC)   Abnormal liver function test   Electrolyte abnormality   GERD (gastroesophageal reflux disease)   Hypothyroidism   UTI (urinary tract infection)    Discharge Instructions  Discharge Instructions     Call MD for:  severe uncontrolled pain   Complete by: As directed    Diet general   Complete by: As directed    Increase activity slowly   Complete by: As directed       Allergies as of 04/02/2022       Reactions   Demeclocycline Other (See Comments)   Throat swells   Sulfa Antibiotics Other (See Comments)   Other reaction(s): Other (See Comments) Throat swells Other reaction(s): Other (See Comments) Throat swells Throat swells Other reaction(s): Other (See Comments) Throat swells Other reaction(s): Unknown Other reaction(s): Other (See Comments) Throat swells Other reaction(s): Other (See Comments) Other reaction(s): Other (See Comments) Throat swellsOther reaction(s): Other (See Comments) Throat swells   Tetracyclines & Related Other (See Comments)   Throat swells   Levofloxacin Diarrhea   Severe diarrhea   Augmentin [amoxicillin-pot Clavulanate] Diarrhea   Bentyl [dicyclomine Hcl]    unkn   Ciprofloxacin Diarrhea   Codeine Other (See Comments)   dizziness   Dicyclomine Hcl Other (See Comments)   unkn   Epinephrine    Speeds heart up - panic    Flagyl [metronidazole] Nausea And Vomiting   Librax [chlordiazepoxide-clidinium]    unkn   Novocain [procaine] Other (See Comments)   "Shaky"  Phenobarbital    unkn   Prednisone    Nervous    Ultram [tramadol] Other (See Comments)   Sick feeling        Medication List     STOP taking these medications    clotrimazole 1 % cream Commonly known as: LOTRIMIN   valACYclovir  500 MG tablet Commonly known as: VALTREX       TAKE these medications    Azelastine-Fluticasone 137-50 MCG/ACT Susp SPRAY 2 SPRAYS INTO EACH NOSTRIL EVERY DAY   cephALEXin 500 MG capsule Commonly known as: KEFLEX Take 1 capsule (500 mg total) by mouth 3 (three) times daily for 7 days.   clonazePAM 0.5 MG tablet Commonly known as: KLONOPIN Take 1 tablet (0.5 mg total) by mouth 3 (three) times daily as needed for anxiety. What changed:  how much to take when to take this reasons to take this   diphenoxylate-atropine 2.5-0.025 MG tablet Commonly known as: LOMOTIL Take 1 tablet by mouth 4 (four) times daily as needed for diarrhea or loose stools.   levothyroxine 125 MCG tablet Commonly known as: SYNTHROID TAKE 1 TABLET EVERY DAY ON EMPTY STOMACHWITH A GLASS OF WATER AT LEAST 30-60 MINBEFORE BREAKFAST   Lexapro 20 MG tablet Generic drug: escitalopram Take 20 mg by mouth every morning.   loperamide 2 MG capsule Commonly known as: IMODIUM Take by mouth.   metoprolol tartrate 25 MG tablet Commonly known as: LOPRESSOR TAKE 1 TABLET (25 MG TOTAL) BY MOUTH AS NEEDED (AS NEEDED UP TO TWICE DAILY FOR PALPITATIONS).   ondansetron 8 MG tablet Commonly known as: ZOFRAN Take 1 tablet (8 mg total) by mouth 2 (two) times daily as needed for nausea or vomiting. What changed: Another medication with the same name was removed. Continue taking this medication, and follow the directions you see here.   pantoprazole 40 MG tablet Commonly known as: PROTONIX TAKE ONE TABLET (40 MG) BY MOUTH EVERY DAY   potassium chloride SA 20 MEQ tablet Commonly known as: KLOR-CON M Take 20 mEq by mouth daily.   Venclexta 100 MG tablet Generic drug: venetoclax Take 4 tablets (400 mg total) by mouth daily. Take for 14 days, then hold for 14 days. Repeat every 28 days. Take with a meal and a full glass of water.        Allergies  Allergen Reactions   Demeclocycline Other (See Comments)     Throat swells   Sulfa Antibiotics Other (See Comments)    Other reaction(s): Other (See Comments) Throat swells Other reaction(s): Other (See Comments) Throat swells Throat swells Other reaction(s): Other (See Comments) Throat swells Other reaction(s): Unknown Other reaction(s): Other (See Comments) Throat swells Other reaction(s): Other (See Comments) Other reaction(s): Other (See Comments) Throat swellsOther reaction(s): Other (See Comments) Throat swells   Tetracyclines & Related Other (See Comments)    Throat swells    Levofloxacin Diarrhea    Severe diarrhea   Augmentin [Amoxicillin-Pot Clavulanate] Diarrhea   Bentyl [Dicyclomine Hcl]     unkn   Ciprofloxacin Diarrhea   Codeine Other (See Comments)    dizziness     Dicyclomine Hcl Other (See Comments)    unkn   Epinephrine     Speeds heart up - panic    Flagyl [Metronidazole] Nausea And Vomiting   Librax [Chlordiazepoxide-Clidinium]     unkn   Novocain [Procaine] Other (See Comments)    "Shaky"   Phenobarbital     unkn   Prednisone     Nervous  Ultram [Tramadol] Other (See Comments)    Sick feeling    Consultations: Oncology, curbside consultation   Procedures/Studies: CT Renal Stone Study  Result Date: 03/31/2022 CLINICAL DATA:  Abdominal/flank pain, stone suspected EXAM: CT ABDOMEN AND PELVIS WITHOUT CONTRAST TECHNIQUE: Multidetector CT imaging of the abdomen and pelvis was performed following the standard protocol without IV contrast. RADIATION DOSE REDUCTION: This exam was performed according to the departmental dose-optimization program which includes automated exposure control, adjustment of the mA and/or kV according to patient size and/or use of iterative reconstruction technique. COMPARISON:  06/16/2020 FINDINGS: Lower chest: No pleural or pericardial effusion. Hepatobiliary: 2.3 cm low-attenuation lesion in hepatic segment 2 (Im18,Se4), in retrospect possibly present as a region of of a  enhancement 1.7 cm on 07/21/2017 suggesting hemangioma although nonspecific. No other liver lesions. Cholecystectomy clips. Pancreas: Unremarkable. No pancreatic ductal dilatation or surrounding inflammatory changes. Spleen: Normal in size without focal abnormality. Adrenals/Urinary Tract: No adrenal mass. Bilateral urolithiasis, largest stone in the left upper pole 8 mm, on the right 6 mm. There is new right hydronephrosis and ureterectasis down to the level of the ureteral orifice without visible obstructing Calculus.urinary bladder is physiologically distended. Stomach/Bowel: Small hiatal hernia. Stomach incompletely distended. Small bowel decompressed. Changes of right hemicolectomy with staple line in the mid abdomen. Remaining left colon unremarkable. Vascular/Lymphatic: Mild scattered aortoiliac calcified plaque. No abdominal or pelvic adenopathy. Reproductive: Uterus and bilateral adnexa are unremarkable. Other: Right pelvic phleboliths.  No ascites.  No free air. Musculoskeletal: Right hip arthroplasty projects in expected location, incompletely visualized. Mild left hip DJD. No acute findings. IMPRESSION: 1. New right hydronephrosis and ureterectasis down to the level of the ureteral orifice without visible obstructing Calculus. 2. Bilateral nephrolithiasis. 3. 2.3 cm low-attenuation lesion in hepatic segment 2, possibly hemangioma but nonspecific. Consider elective outpatient MR liver protocol with contrast for further characterization. 4. Small hiatal hernia. Aortic Atherosclerosis (ICD10-I70.0). Electronically Signed   By: Lucrezia Europe M.D.   On: 03/31/2022 17:19   CT BONE MARROW BIOPSY & ASPIRATION  Result Date: 03/23/2022 INDICATION: 71 year old female with history of AML. EXAM: CT-GUIDED BONE MARROW BIOPSY AND ASPIRATION MEDICATIONS: None ANESTHESIA/SEDATION: Fentanyl 100 mcg IV; Versed 2 mg IV Sedation Time: 10 minutes; The patient was continuously monitored during the procedure by the  interventional radiology nurse under my direct supervision. COMPLICATIONS: None immediate. PROCEDURE: Informed consent was obtained from the patient following an explanation of the procedure, risks, benefits and alternatives. The patient understands, agrees and consents for the procedure. All questions were addressed. A time out was performed prior to the initiation of the procedure. The patient was positioned prone and non-contrast localization CT was performed of the pelvis to demonstrate the iliac marrow spaces. The operative site was prepped and draped in the usual sterile fashion. Under sterile conditions and local anesthesia, a 22 gauge spinal needle was utilized for procedural planning. Next, an 11 gauge coaxial bone biopsy needle was advanced into the right iliac marrow space. Needle position was confirmed with CT imaging. Initially, a bone marrow aspiration was performed. Next, a bone marrow biopsy was obtained with the 11 gauge outer bone marrow device. Samples were prepared with the cytotechnologist and deemed adequate. The needle was removed and superficial hemostasis was obtained with manual compression. A dressing was applied. The patient tolerated the procedure well without immediate post procedural complication. IMPRESSION: Successful CT guided right iliac bone marrow aspiration and core biopsy. Ruthann Cancer, MD [facility] Electronically Signed  By: Ruthann Cancer M.D.   On: 03/23/2022 09:50   (Echo, Carotid, EGD, Colonoscopy, ERCP)    Subjective: Patient seen and examined.  Denies any pain.  Denies any nausea vomiting.  She has some dry cough.  Urinating without trouble.  Friend at the bedside. Consented for blood transfusion and currently receiving blood transfusion.   Discharge Exam: Vitals:   04/02/22 1055 04/02/22 1110  BP: (!) 128/57 130/61  Pulse: 88 86  Resp: 18 16  Temp: 99.3 F (37.4 C) 99.3 F (37.4 C)  SpO2: 100%  100%   Vitals:   04/02/22 0525 04/02/22 0911 04/02/22 1055 04/02/22 1110  BP: 134/61 139/64 (!) 128/57 130/61  Pulse: 85 82 88 86  Resp: _0 Temp: 99 F (37.2 C) 98.6 F (37 C) 99.3 F (37.4 C) 99.3 F (37.4 C)  TempSrc: Oral  Oral Oral  SpO2: 99% 99% 100% 100%  Weight:      Height:        General: Pt is alert, awake, not in acute distress Frail.  Pale. Cardiovascular: RRR, S1/S2 +, no rubs, no gallops Respiratory: CTA bilaterally, no wheezing, no rhonchi Abdominal: Soft, NT, ND, bowel sounds + Extremities: no edema, no cyanosis    The results of significant diagnostics from this hospitalization (including imaging, microbiology, ancillary and laboratory) are listed below for reference.     Microbiology: Recent Results (from the past 240 hour(s))  Culture, blood (Routine X 2) w Reflex to ID Panel     Status: None (Preliminary result)   Collection Time: 04/01/22 10:20 AM   Specimen: BLOOD  Result Value Ref Range Status   Specimen Description BLOOD RIGHT ANTECUBITAL  Final   Special Requests   Final    BOTTLES DRAWN AEROBIC AND ANAEROBIC Blood Culture results may not be optimal due to an excessive volume of blood received in culture bottles   Culture   Final    NO GROWTH < 24 HOURS Performed at [facility], [address] [place], [place] [address]    Report Status PENDING  Incomplete  Culture, blood (Routine X 2) w Reflex to ID Panel     Status: None (Preliminary result)   Collection Time: 04/01/22 10:25 AM   Specimen: BLOOD  Result Value Ref Range Status   Specimen Description BLOOD BLOOD LEFT ARM  Final   Special Requests   Final    BOTTLES DRAWN AEROBIC AND ANAEROBIC Blood Culture results may not be optimal due to an excessive volume of blood received in culture bottles   Culture   Final    NO GROWTH < 24 HOURS Performed at [facility], [address] [place], [place] [address]    Report Status PENDING  Incomplete      Labs: BNP (last 3 results) No results for input(s): "BNP" in the last 8760 hours. Basic Metabolic Panel: Recent Labs  Lab 03/31/22 1628 03/31/22 1630 04/01/22 0446 04/02/22 0515  NA 141  --  137 143  K 2.6*  --  3.7 4.0  CL 111  --  113* 117*  CO2 23  --  19* 24  GLUCOSE 124*  --  114* 106*  BUN 17  --  16 16  CREATININE 1.07*  --  1.22* 0.78  CALCIUM 8.7*  --  7.8* 8.0*  MG  --  1.5*  --  2.0  PHOS  --   --   --  2.9   Liver Function Tests: Recent Labs  Lab 03/31/22 1630 04/01/22 0446 04/02/22 0515  AST 16 13* 9*  ALT _0 ALKPHOS 78 69 68  BILITOT 1.2 1.3* 0.8  PROT 6.5 5.3* 5.3*  ALBUMIN 4.0 3.2* 3.1*   No results for input(s): "LIPASE", "AMYLASE" in the last 168 hours. No results for input(s): "AMMONIA" in the last 168 hours. CBC: Recent Labs  Lab 03/31/22 1628 03/31/22 1630 04/01/22 0446 04/02/22 0515  WBC 0.9* 1.0* 0.9* 0.7*  NEUTROABS  --  0.4*  --  0.3*  HGB 9.0* 8.9* 7.8* 7.0*  HCT 25.5* 25.7* 23.3* 21.2*  MCV 87.9 88.0 90.0 91.8  PLT 93* 93* 67* 58*   Cardiac Enzymes: No results for input(s): "CKTOTAL", "CKMB", "CKMBINDEX", "TROPONINI" in the last 168 hours. BNP: Invalid input(s): "POCBNP" CBG: No results for input(s): "GLUCAP" in the last 168 hours. D-Dimer No results for input(s): "DDIMER" in the last 72 hours. Hgb A1c No results for input(s): "HGBA1C" in the last 72 hours. Lipid Profile No results for input(s): "CHOL", "HDL", "LDLCALC", "TRIG", "CHOLHDL", "LDLDIRECT" in the last 72 hours. Thyroid function studies No results for input(s): "TSH", "T4TOTAL", "T3FREE", "THYROIDAB" in the last 72 hours.  Invalid input(s): "FREET3" Anemia work up No results for input(s): "VITAMINB12", "FOLATE", "FERRITIN", "TIBC", "IRON", "RETICCTPCT" in the last 72 hours. Urinalysis    Component Value Date/Time   COLORURINE YELLOW (A) 03/31/2022 1630   APPEARANCEUR HAZY (A) 03/31/2022 1630   APPEARANCEUR Hazy 11/30/2013 1821   LABSPEC 1.019  03/31/2022 1630   LABSPEC 1.023 11/30/2013 1821   PHURINE 5.0 03/31/2022 1630   GLUCOSEU NEGATIVE 03/31/2022 1630   GLUCOSEU NEGATIVE 09/11/2014 0810   HGBUR LARGE (A) 03/31/2022 1630   BILIRUBINUR NEGATIVE 03/31/2022 1630   BILIRUBINUR Negative 11/30/2013 1821   KETONESUR NEGATIVE 03/31/2022 1630   PROTEINUR 30 (A) 03/31/2022 1630   UROBILINOGEN 0.2 09/11/2014 0810   NITRITE NEGATIVE 03/31/2022 1630   LEUKOCYTESUR NEGATIVE 03/31/2022 1630   LEUKOCYTESUR Negative 11/30/2013 1821   Sepsis Labs Recent Labs  Lab 03/31/22 1628 03/31/22 1630 04/01/22 0446 04/02/22 0515  WBC 0.9* 1.0* 0.9* 0.7*   Microbiology Recent Results (from the past 240 hour(s))  Culture, blood (Routine X 2) w Reflex to ID Panel     Status: None (Preliminary result)   Collection Time: 04/01/22 10:20 AM   Specimen: BLOOD  Result Value Ref Range Status   Specimen Description BLOOD RIGHT ANTECUBITAL  Final   Special Requests   Final    BOTTLES DRAWN AEROBIC AND ANAEROBIC Blood Culture results may not be optimal due to an excessive volume of blood received in culture bottles   Culture   Final    NO GROWTH < 24 HOURS Performed at [facility], [address] [place], [place] [address]    Report Status PENDING  Incomplete  Culture, blood (Routine X 2) w Reflex to ID Panel     Status: None (Preliminary result)   Collection Time: 04/01/22 10:25 AM   Specimen: BLOOD  Result Value Ref Range Status   Specimen Description BLOOD BLOOD LEFT ARM  Final   Special Requests   Final    BOTTLES DRAWN AEROBIC AND ANAEROBIC Blood Culture results may not be optimal due to an excessive volume of blood received in culture bottles   Culture   Final    NO GROWTH < 24 HOURS Performed at [facility], [address], [place], [place] [address]    Report Status PENDING  Incomplete     Time coordinating  discharge: 35 minutes  SIGNED:   Barb Merino, MD  [name] 04/02/2022, 11:12 AM

## 2022-04-02 NOTE — Progress Notes (Signed)
PHARMACY - PHYSICIAN COMMUNICATION CRITICAL VALUE ALERT - BLOOD CULTURE IDENTIFICATION (BCID)  Debra Hale is an 71 y.o. female who presented to [facility] on 03/31/2022 with a chief complaint of right-sided flank pain  Assessment:  1/4, anaerobic bottle, Staphylococcus species  Name of physician (or Provider) Contacted: Barb Merino  Current antibiotics: Ceftriaxone 2 g IV q24H while inpatient; recently discharged with cephalexin 500 mg PO TID x 7 days  Changes to prescribed antibiotics recommended:  None at this time due to suspicion for contaminant. Will continue to follow patient at provider's request to assess for need to escalate therapy.  Results for orders placed or performed during the hospital encounter of 03/31/22  Blood Culture ID Panel (Reflexed) (Collected: 04/01/2022 10:20 AM)  Result Value Ref Range   Enterococcus faecalis NOT DETECTED NOT DETECTED   Enterococcus Faecium NOT DETECTED NOT DETECTED   Listeria monocytogenes NOT DETECTED NOT DETECTED   Staphylococcus species DETECTED (A) NOT DETECTED   Staphylococcus aureus (BCID) NOT DETECTED NOT DETECTED   Staphylococcus epidermidis NOT DETECTED NOT DETECTED   Staphylococcus lugdunensis NOT DETECTED NOT DETECTED   Streptococcus species NOT DETECTED NOT DETECTED   Streptococcus agalactiae NOT DETECTED NOT DETECTED   Streptococcus pneumoniae NOT DETECTED NOT DETECTED   Streptococcus pyogenes NOT DETECTED NOT DETECTED   A.calcoaceticus-baumannii NOT DETECTED NOT DETECTED   Bacteroides fragilis NOT DETECTED NOT DETECTED   Enterobacterales NOT DETECTED NOT DETECTED   Enterobacter cloacae complex NOT DETECTED NOT DETECTED   Escherichia coli NOT DETECTED NOT DETECTED   Klebsiella aerogenes NOT DETECTED NOT DETECTED   Klebsiella oxytoca NOT DETECTED NOT DETECTED   Klebsiella pneumoniae NOT DETECTED NOT DETECTED   Proteus species NOT DETECTED NOT DETECTED   Salmonella species NOT DETECTED NOT DETECTED   Serratia  marcescens NOT DETECTED NOT DETECTED   Haemophilus influenzae NOT DETECTED NOT DETECTED   Neisseria meningitidis NOT DETECTED NOT DETECTED   Pseudomonas aeruginosa NOT DETECTED NOT DETECTED   Stenotrophomonas maltophilia NOT DETECTED NOT DETECTED   Candida albicans NOT DETECTED NOT DETECTED   Candida auris NOT DETECTED NOT DETECTED   Candida glabrata NOT DETECTED NOT DETECTED   Candida krusei NOT DETECTED NOT DETECTED   Candida parapsilosis NOT DETECTED NOT DETECTED   Candida tropicalis NOT DETECTED NOT DETECTED   Cryptococcus neoformans/gattii NOT DETECTED NOT DETECTED   Will M. Ouida Sills, PharmD PGY-1 Pharmacy Resident 04/02/2022 2:16 PM

## 2022-04-03 ENCOUNTER — Telehealth: Payer: Self-pay | Admitting: *Deleted

## 2022-04-03 LAB — BPAM RBC
Blood Product Expiration Date: 202312062359
ISSUE DATE / TIME: 202311301048
Unit Type and Rh: 9500

## 2022-04-03 LAB — TYPE AND SCREEN
ABO/RH(D): A POS
Antibody Screen: NEGATIVE
Unit division: 0

## 2022-04-03 MED ORDER — NITROFURANTOIN MONOHYD MACRO 100 MG PO CAPS: 100.0000 mg | ORAL_CAPSULE | Freq: Two times a day (BID) | ORAL | 0 refills | Status: DC

## 2022-04-03 NOTE — Telephone Encounter (Signed)
Returned patient call to triage line. Patient reports that yesterday she began feeling weak and became incontinent of urine. She went to ED and wast diagnosed with complicated UTI and placed on a 7 day course of Keflex. She states she in unable to take the Keflex as it makes her sick to her stomach. She has not called her PCP. She sates that she is getting worse.

## 2022-04-03 NOTE — Telephone Encounter (Signed)
Spoke with patient by phone.  She reports that she does not like the way the Keflex has made her feel and that is caused upset stomach.  Of note, we reviewed her allergy list and it appears that many antibiotics documented as allergies are really associated with upset stomach.  Will trial Macrobid.  Patient is scheduled to see Dr. [name] on Monday.  Case and plan discussed with Dr. [name].

## 2022-04-04 ENCOUNTER — Emergency Department: Payer: PPO

## 2022-04-04 ENCOUNTER — Other Ambulatory Visit: Payer: Self-pay

## 2022-04-04 ENCOUNTER — Inpatient Hospital Stay
Admission: EM | Admit: 2022-04-04 | Discharge: 2022-04-09 | DRG: 871 | Disposition: A | Discharge status: Home / Self Care | Payer: PPO | Attending: Student | Admitting: Student

## 2022-04-04 DIAGNOSIS — Z820 Family history of epilepsy and other diseases of the nervous system: Secondary | ICD-10-CM

## 2022-04-04 DIAGNOSIS — F32A Depression, unspecified: Secondary | ICD-10-CM | POA: Diagnosis present

## 2022-04-04 DIAGNOSIS — I509 Heart failure, unspecified: Secondary | ICD-10-CM | POA: Diagnosis not present

## 2022-04-04 DIAGNOSIS — M810 Age-related osteoporosis without current pathological fracture: Secondary | ICD-10-CM | POA: Diagnosis present

## 2022-04-04 DIAGNOSIS — R6520 Severe sepsis without septic shock: Secondary | ICD-10-CM | POA: Diagnosis not present

## 2022-04-04 DIAGNOSIS — F418 Other specified anxiety disorders: Secondary | ICD-10-CM | POA: Diagnosis present

## 2022-04-04 DIAGNOSIS — E890 Postprocedural hypothyroidism: Secondary | ICD-10-CM | POA: Diagnosis present

## 2022-04-04 DIAGNOSIS — Z87442 Personal history of urinary calculi: Secondary | ICD-10-CM | POA: Diagnosis not present

## 2022-04-04 DIAGNOSIS — D61818 Other pancytopenia: Secondary | ICD-10-CM | POA: Diagnosis not present

## 2022-04-04 DIAGNOSIS — R4182 Altered mental status, unspecified: Principal | ICD-10-CM

## 2022-04-04 DIAGNOSIS — E039 Hypothyroidism, unspecified: Secondary | ICD-10-CM | POA: Diagnosis not present

## 2022-04-04 DIAGNOSIS — Z87440 Personal history of urinary (tract) infections: Secondary | ICD-10-CM

## 2022-04-04 DIAGNOSIS — Z853 Personal history of malignant neoplasm of breast: Secondary | ICD-10-CM

## 2022-04-04 DIAGNOSIS — D709 Neutropenia, unspecified: Secondary | ICD-10-CM | POA: Diagnosis not present

## 2022-04-04 DIAGNOSIS — I110 Hypertensive heart disease with heart failure: Secondary | ICD-10-CM | POA: Diagnosis present

## 2022-04-04 DIAGNOSIS — Z923 Personal history of irradiation: Secondary | ICD-10-CM | POA: Diagnosis not present

## 2022-04-04 DIAGNOSIS — Z823 Family history of stroke: Secondary | ICD-10-CM

## 2022-04-04 DIAGNOSIS — A4189 Other specified sepsis: Secondary | ICD-10-CM | POA: Diagnosis not present

## 2022-04-04 DIAGNOSIS — Z885 Allergy status to narcotic agent status: Secondary | ICD-10-CM

## 2022-04-04 DIAGNOSIS — R6510 Systemic inflammatory response syndrome (SIRS) of non-infectious origin without acute organ dysfunction: Secondary | ICD-10-CM

## 2022-04-04 DIAGNOSIS — F419 Anxiety disorder, unspecified: Secondary | ICD-10-CM | POA: Diagnosis not present

## 2022-04-04 DIAGNOSIS — M1990 Unspecified osteoarthritis, unspecified site: Secondary | ICD-10-CM | POA: Diagnosis not present

## 2022-04-04 DIAGNOSIS — R197 Diarrhea, unspecified: Secondary | ICD-10-CM | POA: Diagnosis not present

## 2022-04-04 DIAGNOSIS — I5032 Chronic diastolic (congestive) heart failure: Secondary | ICD-10-CM | POA: Diagnosis present

## 2022-04-04 DIAGNOSIS — C9200 Acute myeloblastic leukemia, not having achieved remission: Secondary | ICD-10-CM

## 2022-04-04 DIAGNOSIS — R7989 Other specified abnormal findings of blood chemistry: Secondary | ICD-10-CM | POA: Diagnosis not present

## 2022-04-04 DIAGNOSIS — Z79899 Other long term (current) drug therapy: Secondary | ICD-10-CM

## 2022-04-04 DIAGNOSIS — K219 Gastro-esophageal reflux disease without esophagitis: Secondary | ICD-10-CM | POA: Diagnosis not present

## 2022-04-04 DIAGNOSIS — E7800 Pure hypercholesterolemia, unspecified: Secondary | ICD-10-CM | POA: Diagnosis not present

## 2022-04-04 DIAGNOSIS — Z881 Allergy status to other antibiotic agents status: Secondary | ICD-10-CM

## 2022-04-04 DIAGNOSIS — E876 Hypokalemia: Secondary | ICD-10-CM | POA: Diagnosis present

## 2022-04-04 DIAGNOSIS — Z1152 Encounter for screening for COVID-19: Secondary | ICD-10-CM

## 2022-04-04 DIAGNOSIS — Z882 Allergy status to sulfonamides status: Secondary | ICD-10-CM

## 2022-04-04 DIAGNOSIS — Z85850 Personal history of malignant neoplasm of thyroid: Secondary | ICD-10-CM | POA: Diagnosis not present

## 2022-04-04 DIAGNOSIS — Z85038 Personal history of other malignant neoplasm of large intestine: Secondary | ICD-10-CM | POA: Diagnosis not present

## 2022-04-04 DIAGNOSIS — K529 Noninfective gastroenteritis and colitis, unspecified: Secondary | ICD-10-CM | POA: Diagnosis not present

## 2022-04-04 DIAGNOSIS — G9341 Metabolic encephalopathy: Secondary | ICD-10-CM | POA: Diagnosis present

## 2022-04-04 DIAGNOSIS — Z803 Family history of malignant neoplasm of breast: Secondary | ICD-10-CM

## 2022-04-04 DIAGNOSIS — J101 Influenza due to other identified influenza virus with other respiratory manifestations: Secondary | ICD-10-CM | POA: Diagnosis not present

## 2022-04-04 DIAGNOSIS — R410 Disorientation, unspecified: Secondary | ICD-10-CM | POA: Diagnosis not present

## 2022-04-04 DIAGNOSIS — Z801 Family history of malignant neoplasm of trachea, bronchus and lung: Secondary | ICD-10-CM

## 2022-04-04 DIAGNOSIS — A419 Sepsis, unspecified organism: Secondary | ICD-10-CM | POA: Diagnosis present

## 2022-04-04 DIAGNOSIS — Z96641 Presence of right artificial hip joint: Secondary | ICD-10-CM | POA: Diagnosis present

## 2022-04-04 DIAGNOSIS — Z800 Family history of malignant neoplasm of digestive organs: Secondary | ICD-10-CM

## 2022-04-04 DIAGNOSIS — Z79890 Hormone replacement therapy (postmenopausal): Secondary | ICD-10-CM

## 2022-04-04 DIAGNOSIS — Z8042 Family history of malignant neoplasm of prostate: Secondary | ICD-10-CM

## 2022-04-04 DIAGNOSIS — F4489 Other dissociative and conversion disorders: Secondary | ICD-10-CM | POA: Diagnosis not present

## 2022-04-04 DIAGNOSIS — Z9049 Acquired absence of other specified parts of digestive tract: Secondary | ICD-10-CM

## 2022-04-04 DIAGNOSIS — Z888 Allergy status to other drugs, medicaments and biological substances status: Secondary | ICD-10-CM

## 2022-04-04 DIAGNOSIS — R509 Fever, unspecified: Secondary | ICD-10-CM | POA: Diagnosis not present

## 2022-04-04 DIAGNOSIS — Z9221 Personal history of antineoplastic chemotherapy: Secondary | ICD-10-CM

## 2022-04-04 LAB — CBC WITH DIFFERENTIAL/PLATELET
Abs Immature Granulocytes: 0.01 10*3/uL (ref 0.00–0.07)
Abs Immature Granulocytes: 0.04 10*3/uL (ref 0.00–0.07)
Basophils Absolute: 0 10*3/uL (ref 0.0–0.1)
Basophils Absolute: 0 10*3/uL (ref 0.0–0.1)
Basophils Relative: 0 %
Basophils Relative: 0 %
Eosinophils Absolute: 0 10*3/uL (ref 0.0–0.5)
Eosinophils Absolute: 0 10*3/uL (ref 0.0–0.5)
Eosinophils Relative: 1 %
Eosinophils Relative: 0 %
HCT: 21.2 % — ABNORMAL LOW (ref 36.0–46.0)
HCT: 26.2 % — ABNORMAL LOW (ref 36.0–46.0)
Hemoglobin: 7 g/dL — ABNORMAL LOW (ref 12.0–15.0)
Hemoglobin: 8.9 g/dL — ABNORMAL LOW (ref 12.0–15.0)
Immature Granulocytes: 1 %
Immature Granulocytes: 8 %
Lymphocytes Relative: 45 %
Lymphocytes Relative: 22 %
Lymphs Abs: 0.3 10*3/uL — ABNORMAL LOW (ref 0.7–4.0)
Lymphs Abs: 0.1 10*3/uL — ABNORMAL LOW (ref 0.7–4.0)
MCH: 30.3 pg (ref 26.0–34.0)
MCH: 30.2 pg (ref 26.0–34.0)
MCHC: 33 g/dL (ref 30.0–36.0)
MCHC: 34 g/dL (ref 30.0–36.0)
MCV: 91.8 fL (ref 80.0–100.0)
MCV: 88.8 fL (ref 80.0–100.0)
Monocytes Absolute: 0 10*3/uL — ABNORMAL LOW (ref 0.1–1.0)
Monocytes Absolute: 0.1 10*3/uL (ref 0.1–1.0)
Monocytes Relative: 6 %
Monocytes Relative: 10 %
Neutro Abs: 0.3 10*3/uL — CL (ref 1.7–7.7)
Neutro Abs: 0.3 10*3/uL — CL (ref 1.7–7.7)
Neutrophils Relative %: 47 %
Neutrophils Relative %: 60 %
Platelets: 58 10*3/uL — ABNORMAL LOW (ref 150–400)
Platelets: 54 10*3/uL — ABNORMAL LOW (ref 150–400)
RBC: 2.31 MIL/uL — ABNORMAL LOW (ref 3.87–5.11)
RBC: 2.95 MIL/uL — ABNORMAL LOW (ref 3.87–5.11)
RDW: 16.3 % — ABNORMAL HIGH (ref 11.5–15.5)
RDW: 15.9 % — ABNORMAL HIGH (ref 11.5–15.5)
Smear Review: NORMAL
Smear Review: DECREASED
WBC: 0.7 10*3/uL — CL (ref 4.0–10.5)
WBC: 0.5 10*3/uL — CL (ref 4.0–10.5)
nRBC: 0 % (ref 0.0–0.2)
nRBC: 0 % (ref 0.0–0.2)

## 2022-04-04 LAB — BASIC METABOLIC PANEL
Anion gap: 10 (ref 5–15)
BUN: 14 mg/dL (ref 8–23)
CO2: 23 mmol/L (ref 22–32)
Calcium: 8.2 mg/dL — ABNORMAL LOW (ref 8.9–10.3)
Chloride: 103 mmol/L (ref 98–111)
Creatinine, Ser: 0.82 mg/dL (ref 0.44–1.00)
GFR, Estimated: >60 mL/min (ref >60)
Glucose, Bld: 136 mg/dL — ABNORMAL HIGH (ref 70–99)
Potassium: 2.5 mmol/L — CL (ref 3.5–5.1)
Sodium: 136 mmol/L (ref 135–145)

## 2022-04-04 LAB — RESP PANEL BY RT-PCR (FLU A&B, COVID) ARPGX2
Influenza A by PCR: POSITIVE — AB
Influenza B by PCR: NEGATIVE
SARS Coronavirus 2 by RT PCR: NEGATIVE

## 2022-04-04 LAB — TYPE AND SCREEN

## 2022-04-04 LAB — CBC
HCT: 26.6 % — ABNORMAL LOW (ref 36.0–46.0)
Hemoglobin: 9.1 g/dL — ABNORMAL LOW (ref 12.0–15.0)
MCH: 30.5 pg (ref 26.0–34.0)
MCHC: 34.2 g/dL (ref 30.0–36.0)
MCV: 89.3 fL (ref 80.0–100.0)
Platelets: 55 10*3/uL — ABNORMAL LOW (ref 150–400)
RBC: 2.98 MIL/uL — ABNORMAL LOW (ref 3.87–5.11)
RDW: 15.8 % — ABNORMAL HIGH (ref 11.5–15.5)
WBC: 0.5 10*3/uL — CL (ref 4.0–10.5)
nRBC: 0 % (ref 0.0–0.2)

## 2022-04-04 LAB — PHOSPHORUS: Phosphorus: 2.4 mg/dL — ABNORMAL LOW (ref 2.5–4.6)

## 2022-04-04 LAB — PROCALCITONIN: Procalcitonin: 0.87 ng/mL

## 2022-04-04 LAB — CULTURE, BLOOD (ROUTINE X 2)

## 2022-04-04 LAB — PROTIME-INR
INR: 1.3 — ABNORMAL HIGH (ref 0.8–1.2)
Prothrombin Time: 15.8 seconds — ABNORMAL HIGH (ref 11.4–15.2)

## 2022-04-04 LAB — LACTIC ACID, PLASMA
Lactic Acid, Venous: 2.3 mmol/L (ref 0.5–1.9)
Lactic Acid, Venous: 2 mmol/L (ref 0.5–1.9)
Lactic Acid, Venous: 2 mmol/L (ref 0.5–1.9)
Lactic Acid, Venous: 1.4 mmol/L (ref 0.5–1.9)

## 2022-04-04 LAB — BRAIN NATRIURETIC PEPTIDE: B Natriuretic Peptide: 369.6 pg/mL — ABNORMAL HIGH (ref 0.0–100.0)

## 2022-04-04 LAB — MAGNESIUM: Magnesium: 1.7 mg/dL (ref 1.7–2.4)

## 2022-04-04 LAB — APTT: aPTT: 32 seconds (ref 24–36)

## 2022-04-04 MED ORDER — POTASSIUM CHLORIDE 10 MEQ/100ML IV SOLN
10.0000 meq | Freq: Once | INTRAVENOUS | Status: AC
  Administered 2022-04-04: 10 meq via INTRAVENOUS
  Filled 2022-04-04: qty 100

## 2022-04-04 MED ORDER — POTASSIUM CHLORIDE CRYS ER 20 MEQ PO TBCR
40.0000 meq | EXTENDED_RELEASE_TABLET | Freq: Once | ORAL | Status: AC
  Administered 2022-04-04: 40 meq via ORAL
  Filled 2022-04-04: qty 2

## 2022-04-04 MED ORDER — CLONAZEPAM 0.5 MG PO TABS
0.5000 mg | ORAL_TABLET | Freq: Three times a day (TID) | ORAL | Status: DC | PRN
  Administered 2022-04-04 – 2022-04-09 (×10): 0.5 mg via ORAL
  Filled 2022-04-04 (×10): qty 1

## 2022-04-04 MED ORDER — OSELTAMIVIR PHOSPHATE 75 MG PO CAPS
75.0000 mg | ORAL_CAPSULE | Freq: Two times a day (BID) | ORAL | Status: AC
  Administered 2022-04-04 – 2022-04-09 (×10): 75 mg via ORAL
  Filled 2022-04-04 (×11): qty 1

## 2022-04-04 MED ORDER — VANCOMYCIN HCL IN DEXTROSE 1-5 GM/200ML-% IV SOLN
1000.0000 mg | INTRAVENOUS | Status: DC
  Administered 2022-04-05: 1000 mg via INTRAVENOUS
  Filled 2022-04-04 (×2): qty 200

## 2022-04-04 MED ORDER — ALBUTEROL SULFATE HFA 108 (90 BASE) MCG/ACT IN AERS: 2.0000 | INHALATION_SPRAY | RESPIRATORY_TRACT | Status: DC | PRN

## 2022-04-04 MED ORDER — ALBUTEROL SULFATE (2.5 MG/3ML) 0.083% IN NEBU: 2.5000 mg | INHALATION_SOLUTION | RESPIRATORY_TRACT | Status: DC | PRN

## 2022-04-04 MED ORDER — DIPHENOXYLATE-ATROPINE 2.5-0.025 MG PO TABS
1.0000 | ORAL_TABLET | Freq: Four times a day (QID) | ORAL | Status: DC | PRN
  Administered 2022-04-09: 1 via ORAL
  Filled 2022-04-04: qty 1

## 2022-04-04 MED ORDER — POTASSIUM PHOSPHATES 15 MMOLE/5ML IV SOLN
15.0000 mmol | Freq: Once | INTRAVENOUS | Status: AC
  Administered 2022-04-04: 15 mmol via INTRAVENOUS
  Filled 2022-04-04: qty 5

## 2022-04-04 MED ORDER — DM-GUAIFENESIN ER 30-600 MG PO TB12: 1.0000 | ORAL_TABLET | Freq: Two times a day (BID) | ORAL | Status: DC | PRN

## 2022-04-04 MED ORDER — SODIUM CHLORIDE 0.9 % IV SOLN
2.0000 g | Freq: Two times a day (BID) | INTRAVENOUS | Status: DC
  Administered 2022-04-04 – 2022-04-07 (×6): 2 g via INTRAVENOUS
  Filled 2022-04-04: qty 2
  Filled 2022-04-04: qty 12.5
  Filled 2022-04-04: qty 2
  Filled 2022-04-04 (×2): qty 12.5
  Filled 2022-04-04: qty 2

## 2022-04-04 MED ORDER — ACETAMINOPHEN 325 MG PO TABS
650.0000 mg | ORAL_TABLET | Freq: Four times a day (QID) | ORAL | Status: DC | PRN
  Administered 2022-04-04 – 2022-04-08 (×7): 650 mg via ORAL
  Filled 2022-04-04 (×7): qty 2

## 2022-04-04 MED ORDER — ONDANSETRON HCL 4 MG/2ML IJ SOLN
4.0000 mg | Freq: Three times a day (TID) | INTRAMUSCULAR | Status: DC | PRN
  Administered 2022-04-05 – 2022-04-07 (×2): 4 mg via INTRAVENOUS
  Filled 2022-04-04 (×2): qty 2

## 2022-04-04 MED ORDER — PANTOPRAZOLE SODIUM 40 MG PO TBEC
40.0000 mg | DELAYED_RELEASE_TABLET | Freq: Every day | ORAL | Status: DC
  Administered 2022-04-04 – 2022-04-09 (×6): 40 mg via ORAL
  Filled 2022-04-04 (×6): qty 1

## 2022-04-04 MED ORDER — POTASSIUM CHLORIDE CRYS ER 20 MEQ PO TBCR: 40.0000 meq | EXTENDED_RELEASE_TABLET | ORAL | Status: DC

## 2022-04-04 MED ORDER — LEVOTHYROXINE SODIUM 25 MCG PO TABS
125.0000 ug | ORAL_TABLET | Freq: Every day | ORAL | Status: DC
  Administered 2022-04-05 – 2022-04-09 (×5): 125 ug via ORAL
  Filled 2022-04-04: qty 1
  Filled 2022-04-04: qty 3
  Filled 2022-04-04 (×3): qty 1

## 2022-04-04 MED ORDER — ESCITALOPRAM OXALATE 10 MG PO TABS
20.0000 mg | ORAL_TABLET | ORAL | Status: DC
  Administered 2022-04-05 – 2022-04-09 (×5): 20 mg via ORAL
  Filled 2022-04-04 (×5): qty 2

## 2022-04-04 MED ORDER — SODIUM CHLORIDE 0.9 % IV BOLUS
1000.0000 mL | Freq: Once | INTRAVENOUS | Status: AC
  Administered 2022-04-04: 1000 mL via INTRAVENOUS

## 2022-04-04 MED ORDER — VANCOMYCIN HCL 1250 MG/250ML IV SOLN
1250.0000 mg | Freq: Once | INTRAVENOUS | Status: AC
  Administered 2022-04-04: 1250 mg via INTRAVENOUS
  Filled 2022-04-04: qty 250

## 2022-04-04 NOTE — ED Notes (Signed)
Patient used the toilet to void and was handed urine cup. Urine sample had stool in it and unable to send it to lab.

## 2022-04-04 NOTE — ED Triage Notes (Signed)
Pt in via EMS from home with c/o confusion and AMS this am. Pt is a leukemia pt and is currently getting treatments. Pt also with some diarrhea and some blood in her incontinent pad. 128/60, 96% RA, HR 86, 15RR. Pt here last Thursday for kidney stone

## 2022-04-04 NOTE — ED Provider Triage Note (Signed)
Emergency Medicine Provider Triage Evaluation Note  Debra Hale , a 71 y.o. female  was evaluated in triage.  Pt complains of altered mental's, currently being treated for leukemia, was given blood earlier this week..  Review of Systems  Positive:  Negative:   Physical Exam  Ht '5\' 4"'$  (1.626 m)   Wt 61 kg   BMI 23.08 kg/m  Gen:   Awake, no distress   Resp:  Normal effort  MSK:   Moves extremities without difficulty  Other:  Patient is very pale, confused  Medical Decision Making  Medically screening exam initiated at 10:39 AM.  Appropriate orders placed.  CRICKET GOODLIN was informed that the remainder of the evaluation will be completed by another provider, this initial triage assessment does not replace that evaluation, and the importance of remaining in the ED until their evaluation is complete.  Altered mental status work-up initiated   Versie Starks, PA-C 04/04/22 1040

## 2022-04-04 NOTE — ED Triage Notes (Signed)
Pt reports unsure why she is here. Pt states is hurting but not able to say where. Pt appears pale in triage.

## 2022-04-04 NOTE — ED Notes (Signed)
Attempted to collect stool sample from brief, but was unsuccessful. Brief absorbed stool; diarrhea. Patient informed both stool sample and urine are pending for collection.

## 2022-04-04 NOTE — ED Notes (Signed)
Patient stooled herself. I helped clean patient and changed linens. Patient placed in new brief and own.

## 2022-04-04 NOTE — ED Provider Notes (Signed)
[facility] Provider Note    Event Date/Time   First MD Initiated Contact with Patient 04/04/22 1143     (approximate)   History   Altered Mental Status   HPI  Debra Hale is a 71 y.o. 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  Denies any chest pain or shortness of breath.  States she had multiple episodes of nonbloody stool over the past 24 to 48 hours.  No vomiting.  Denies any abdominal pain.  No flank pain.      Physical Exam   Triage Vital Signs: ED Triage Vitals  Enc Vitals Group     BP 04/04/22 1039 (!) 135/53     Pulse Rate 04/04/22 1039 91     Resp 04/04/22 1039 20     Temp 04/04/22 1039 97.9 F (36.6 C)     Temp Source 04/04/22 1039 Oral     SpO2 04/04/22 1039 95 %     Weight 04/04/22 1037 134 lb 7.7 oz (61 kg)     Height 04/04/22 1037 '5\' 4"'$  (1.626 m)     Head Circumference --      Peak Flow --      Pain Score --      Pain Loc --      Pain Edu? --      Excl. in [place]? --     Most recent vital signs: Vitals:   04/04/22 1145 04/04/22 1200  BP: (!) 121/59 (!) 130/51  Pulse: 93 84  Resp:  (!) 21  Temp:    SpO2: 96% 94%     Constitutional: Alert  Eyes: Conjunctivae are normal.  Head: Atraumatic. Nose: No congestion/rhinnorhea. Mouth/Throat: Mucous membranes are moist.   Neck: Painless ROM.  Cardiovascular:   Good peripheral circulation. Respiratory: Normal respiratory effort.  No retractions.  Gastrointestinal: Soft and nontender.  Musculoskeletal:  no deformity Neurologic:  MAE spontaneously. No gross focal neurologic deficits are appreciated.  Skin:  Skin is warm, dry and intact. No rash noted. Psychiatric: Mood and affect are normal. Speech and behavior are normal.    ED Results / Procedures / Treatments   Labs (all labs ordered are listed, but only abnormal results are displayed) Labs Reviewed  BASIC METABOLIC PANEL - Abnormal;  Notable for the following components:      Result Value   Potassium 2.5 (*)    Glucose, Bld 136 (*)    Calcium 8.2 (*)    All other components within normal limits  CBC - Abnormal; Notable for the following components:   WBC 0.5 (*)    RBC 2.98 (*)    Hemoglobin 9.1 (*)    HCT 26.6 (*)    RDW 15.8 (*)    Platelets 55 (*)    All other components within normal limits  LACTIC ACID, PLASMA - Abnormal; Notable for the following components:   Lactic Acid, Venous 2.3 (*)    All other components within normal limits  LACTIC ACID, PLASMA - Abnormal; Notable for the following components:   Lactic Acid, Venous 2.0 (*)    All other components within normal limits  CBC WITH DIFFERENTIAL/PLATELET - Abnormal; Notable for the following components:   WBC 0.5 (*)    RBC 2.95 (*)    Hemoglobin 8.9 (*)    HCT 26.2 (*)    RDW 15.9 (*)    Platelets 54 (*)    Neutro  Abs 0.3 (*)    Lymphs Abs 0.1 (*)    All other components within normal limits  BRAIN NATRIURETIC PEPTIDE - Abnormal; Notable for the following components:   B Natriuretic Peptide 369.6 (*)    All other components within normal limits  PHOSPHORUS - Abnormal; Notable for the following components:   Phosphorus 2.4 (*)    All other components within normal limits  RESP PANEL BY RT-PCR (FLU A&B, COVID) ARPGX2  C DIFFICILE QUICK SCREEN W PCR REFLEX    GASTROINTESTINAL PANEL BY PCR, STOOL (REPLACES STOOL CULTURE)  CULTURE, BLOOD (ROUTINE X 2)  CULTURE, BLOOD (ROUTINE X 2)  MAGNESIUM  URINALYSIS, ROUTINE W REFLEX MICROSCOPIC  PROTIME-INR  APTT  LACTIC ACID, PLASMA  LACTIC ACID, PLASMA  PROCALCITONIN  CBG MONITORING, ED  TYPE AND SCREEN  TYPE AND SCREEN     EKG  ED ECG REPORT I, Merlyn Lot, the attending physician, personally viewed and interpreted this ECG.   Date: 04/04/2022  EKG Time: 10:51  Rate: 90  Rhythm: sinus  Axis: normal  Intervals: normal qt   ST&T Change: nonspecific st abn, no  stemi    RADIOLOGY Please see ED Course for my review and interpretation.  I personally reviewed all radiographic images ordered to evaluate for the above acute complaints and reviewed radiology reports and findings.  These findings were personally discussed with the patient.  Please see medical record for radiology report.    PROCEDURES:  Critical Care performed: No  Procedures   MEDICATIONS ORDERED IN ED: Medications  ondansetron (ZOFRAN) injection 4 mg (has no administration in time range)  acetaminophen (TYLENOL) tablet 650 mg (has no administration in time range)  potassium chloride SA (KLOR-CON M) CR tablet 40 mEq (40 mEq Oral Given 04/04/22 1208)  potassium chloride 10 mEq in 100 mL IVPB (0 mEq Intravenous Stopped 04/04/22 1330)  sodium chloride 0.9 % bolus 1,000 mL (1,000 mLs Intravenous New Bag/Given 04/04/22 1302)     IMPRESSION / MDM / ASSESSMENT AND PLAN / ED COURSE  I reviewed the triage vital signs and the nursing notes.                              Differential diagnosis includes, but is not limited to, Dehydration, sepsis, pna, uti, hypoglycemia, cva, drug effect, withdrawal, encephalitis   Patient presenting to the ER for evaluation of symptoms as described above.  Based on symptoms, risk factors and considered above differential, this presenting complaint could reflect a potentially life-threatening illness therefore the patient will be placed on continuous pulse oximetry and telemetry for monitoring.  Laboratory evaluation will be sent to evaluate for the above complaints.  CT imaging ordered triage my review and interpretation does not show any evidence of bleed.  No acute finding per radiology.  Chest x-ray without any evidence of infiltrate or consolidation.  Blood work does show that she has evidence of hypokalemia.  She is neutropenic but she is not febrile.  Given her diarrheal illness we will test for C. difficile given recent antibiotic use.  Will check  urinalysis.  Based on presentation will consult hospitalist for admission for electrolyte repletion.      FINAL CLINICAL IMPRESSION(S) / ED DIAGNOSES   Final diagnoses:  Altered mental status, unspecified altered mental status type  Hypokalemia     Rx / DC Orders   ED Discharge Orders     None  Note:  This document was prepared using Dragon voice recognition software and may include unintentional dictation errors.    Merlyn Lot, MD 04/04/22 1515

## 2022-04-04 NOTE — Consult Note (Signed)
Pharmacy Antibiotic Note  Debra Hale is a 71 y.o. female admitted on 04/04/2022 with  febrile neutropenia .  Pharmacy has been consulted for cefepime  dosing.  Plan: Give cefepime 2g IV every 12 hours Give vancomycin '1250mg'$  IV x1 followed by vancomycin '1000mg'$  IV every 24 hours Goal AUC 400-550  Est AUC: 466.1 Est Cmax: 33.0 Est Cmin: 10.7 Calculated with SCr 0.86  Vd 0.72 L/kg   Height: '5\' 4"'$  (162.6 cm) Weight: 61 kg (134 lb 7.7 oz) IBW/kg (Calculated) : 54.7  Temp (24hrs), Avg:100.3 F (37.9 C), Min:97.9 F (36.6 C), Max:102.7 F (39.3 C)  Recent Labs  Lab 03/31/22 1628 03/31/22 1630 03/31/22 2048 04/01/22 0446 04/02/22 0515 04/04/22 1050 04/04/22 1207  WBC 0.9* 1.0*  --  0.9* 0.7* 0.5*  0.5*  --   CREATININE 1.07*  --   --  1.22* 0.78 0.82  --   LATICACIDVEN  --   --  1.4  --   --  2.3* 2.0*    Estimated Creatinine Clearance: 53.6 mL/min (by C-G formula based on SCr of 0.82 mg/dL).    Allergies  Allergen Reactions   Demeclocycline Other (See Comments)    Throat swells   Sulfa Antibiotics Other (See Comments)    Other reaction(s): Other (See Comments) Throat swells Other reaction(s): Other (See Comments) Throat swells Throat swells Other reaction(s): Other (See Comments) Throat swells Other reaction(s): Unknown Other reaction(s): Other (See Comments) Throat swells Other reaction(s): Other (See Comments) Other reaction(s): Other (See Comments) Throat swellsOther reaction(s): Other (See Comments) Throat swells   Tetracyclines & Related Other (See Comments)    Throat swells    Levofloxacin Diarrhea    Severe diarrhea   Augmentin [Amoxicillin-Pot Clavulanate] Diarrhea   Bentyl [Dicyclomine Hcl]     unkn   Ciprofloxacin Diarrhea   Codeine Other (See Comments)    dizziness     Dicyclomine Hcl Other (See Comments)    unkn   Epinephrine     Speeds heart up - panic    Flagyl [Metronidazole] Nausea And Vomiting   Librax  [Chlordiazepoxide-Clidinium]     unkn   Novocain [Procaine] Other (See Comments)    "Shaky"   Phenobarbital     unkn   Prednisone     Nervous     Ultram [Tramadol] Other (See Comments)    Sick feeling    Antimicrobials this admission: 12/2 cefepime >>  12/2 vancomycin >>   Microbiology results: 12/2 BCx: sent 12/2 UCx: sent  GI PCR: sent  C diff PCR: sent  Resp PCR: Influenza A positive  Thank you for allowing pharmacy to be a part of this patient's care.  Darrick Penna 04/04/2022 3:47 PM

## 2022-04-04 NOTE — H&P (Signed)
History and Physical    OAKLYN JAKUBEK YBO:[id number] DOB: 08-30-1950 DOA: 04/04/2022  Referring MD/NP/PA:   PCP: [name], MD   Patient coming from:  The patient is coming from home.    Chief Complaint: AMS and diarrhea, fever.   HPI: Debra Hale is a 71 y.o. 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    Patient was recently hospitalized from 11/28 - 11/30 due to right flank pain with hydronephrosis, no obstructive stone, likely recently passed stone.  Patient was treated with antibiotics for complicated UTI.   Pt is brought in due to confusion. When I saw pt in ED, she is mildly confused, lethargic, with slow responsiveness.  Patient moves all extremities normally.  No facial droop or slurred speech.  She is still orientated x 3.  Patient has mild dry cough, denies chest pain or shortness breath.  No fever or chills.  States that she has chronic diarrhea, with several episodes of diarrhea each day.  Denies nausea vomiting or abdominal pain.  Per report, patient has some blood in her incontinent pad, but she denies hematuria or rectal bleeding or dark stool to me.  She denies symptoms of UTI.    Addendum: pt initially did not have fever, but developed fever of 102.7 in ED  Data reviewed independently and ED Course: pt was found to have pancytopenia with positive Flu A, WBC 0.5, hemoglobin 8.9, platelet 53 (recent baseline WBC 0.7, hemoglobin 7.0, platelet 58 on 04/02/2022), pending COVID PCR, GFR> 60, potassium 2.5, magnesium 2.0.  Temperature normal, blood pressure 130/51,  heart rate 93, RR 21, oxygen saturation 94% on room air.  Chest x-ray showed mild interstitial pulmonary edema.  CT head negative.  Patient is admitted to telemetry bed as inpatient.  Message sent to Dr. Rogue Bussing of oncology for consult.   EKG: I have personally reviewed.  Sinus rhythm, QTc 338, low voltage, nonspecific T wave change.   Review of Systems:   General: no fevers, chills, no body weight gain, fatigue HEENT: no blurry vision, hearing changes or sore throat Respiratory: no dyspnea, coughing, wheezing CV: no chest pain, no palpitations GI: no nausea, vomiting, abdominal pain, constipation. Has diarrhea. GU: no dysuria, burning on urination, increased urinary frequency, hematuria  Ext: no leg edema Neuro: no unilateral weakness, numbness, or tingling, no vision change or hearing loss. Has confusion Skin: no rash, no skin tear. MSK: No muscle spasm, no deformity, no limitation of range of movement in spin Heme: No easy bruising.  Travel history: No recent long distant travel.   Allergy:  Allergies  Allergen Reactions   Demeclocycline Other (See Comments)    Throat swells   Sulfa Antibiotics Other (See Comments)    Other reaction(s): Other (See Comments) Throat swells Other reaction(s): Other (See Comments) Throat swells Throat swells Other reaction(s): Other (See Comments) Throat swells Other reaction(s): Unknown Other reaction(s): Other (See Comments) Throat swells Other reaction(s): Other (See Comments) Other reaction(s): Other (See Comments) Throat swellsOther reaction(s): Other (See Comments) Throat swells   Tetracyclines & Related Other (See Comments)    Throat swells    Levofloxacin Diarrhea    Severe diarrhea   Augmentin [Amoxicillin-Pot Clavulanate] Diarrhea  Bentyl [Dicyclomine Hcl]     unkn   Ciprofloxacin Diarrhea   Codeine Other (See Comments)    dizziness     Dicyclomine Hcl Other (See Comments)    unkn   Epinephrine     Speeds heart  up - panic    Flagyl [Metronidazole] Nausea And Vomiting   Librax [Chlordiazepoxide-Clidinium]     unkn   Novocain [Procaine] Other (See Comments)    "Shaky"   Phenobarbital     unkn   Prednisone     Nervous     Ultram [Tramadol] Other (See Comments)    Sick feeling    Past Medical History:  Diagnosis Date   Abdominal pain 03/06/2016   Acute pericarditis 05/31/2013   Anxiety    Arthritis    Osteoarthritis   Breast cancer ([place]) 2009   right breast lumpectomy with rad tx   Colon cancer ([place])    surgery with chemo and rad tx   Complication of anesthesia    GERD (gastroesophageal reflux disease)    Heart palpitations    History of hiatal hernia    History of kidney stones    History of thyroid cancer 09/18/2008   Qualifier: Diagnosis of   By: Orville Govern, CMA, Carol       Hypothyroidism    Liver disease    Liver nodule    s/p negative biopsy   Malignant neoplasm of thyroid gland ([place]) 2002   s/p surgery and XRT   Osteoporosis    Other and unspecified hyperlipidemia    Palpitations    Personal history of chemotherapy    Personal history of malignant neoplasm of large intestine    carcinoma - cecum, s/p right laparoscopic colectomy - s/p chemotherapy and XRT   Personal history of radiation therapy    Pneumonia 2019   PONV (postoperative nausea and vomiting)    Pure hypercholesterolemia    Unspecified hereditary and idiopathic peripheral neuropathy     Past Surgical History:  Procedure Laterality Date   APPENDECTOMY  1985   BREAST BIOPSY Right 2009   positive   BREAST BIOPSY Right 2009   negative   BREAST LUMPECTOMY Right 2009   breast cancer   CHOLECYSTECTOMY  1995   COLONOSCOPY     COLONOSCOPY WITH PROPOFOL N/A 10/29/2017   Procedure: COLONOSCOPY WITH PROPOFOL;  Surgeon: Manya Silvas, MD;  Location[facility] END[facility]COPY;  Service: Endoscopy;  Laterality: N/A;   DILATION AND CURETTAGE OF UTERUS  1990   DILATION AND CURETTAGE, DIAGNOSTIC / THERAPEUTIC  1990    ESOPHAGOGASTRODUODENOSCOPY     ESOPHAGOGASTRODUODENOSCOPY (EGD) WITH PROPOFOL N/A 10/29/2017   Procedure: ESOPHAGOGASTRODUODENOSCOPY (EGD) WITH PROPOFOL;  Surgeon: Manya Silvas, MD;  Location: [facility] ENDOSCOPY;  Service: Endoscopy;  Laterality: N/A;   INCISION AND DRAINAGE PERIRECTAL ABSCESS N/A 06/17/2020   Procedure: IRRIGATION AND DEBRIDEMENT PERIRECTAL ABSCESS;  Surgeon: Jules Husbands, MD;  Location: [facility] ORS;  Service: General;  Laterality: N/A;   IR BONE MARROW BIOPSY & ASPIRATION  07/09/2021   LAPAROSCOPIC PARTIAL COLECTOMY     stage 3-C carcinoma of the cecum, s/p chemotherapy and xrt   LITHOTRIPSY     SIGMOIDOSCOPY  08/26/1993   THYROID LOBECTOMY  2002   s/p XRT   TOTAL HIP ARTHROPLASTY Right 10/24/2019   Procedure: TOTAL HIP ARTHROPLASTY;  Surgeon: Corky Mull, MD;  Location: [facility] ORS;  Service: Orthopedics;  Laterality: Right;    Social History:  reports that she has never smoked. She has never used smokeless tobacco.  She reports that she does not drink alcohol and does not use drugs.  Family History:  Family History  Problem Relation Age of Onset   Stroke Mother        57s   Alzheimer's disease Mother    Lung cancer Father    Prostate cancer Father    Cancer Father        Colon   Colon cancer Father    Breast cancer Sister        34's   Lung cancer Sister    Breast cancer Maternal Aunt      Prior to Admission medications   Medication Sig Start Date End Date Taking? Authorizing Provider  Azelastine-Fluticasone 137-50 MCG/ACT SUSP SPRAY 2 SPRAYS INTO EACH NOSTRIL EVERY DAY 05/26/21   [name], MD  clonazePAM (KLONOPIN) 0.5 MG tablet Take 1 tablet (0.5 mg total) by mouth 3 (three) times daily as needed for anxiety. 04/02/22   Barb Merino, MD  diphenoxylate-atropine (LOMOTIL) 2.5-0.025 MG tablet Take 1 tablet by mouth 4 (four) times daily as needed for diarrhea or loose stools. 08/26/21   Lloyd Huger, MD  escitalopram (LEXAPRO) 20 MG tablet Take 20 mg  by mouth every morning.     [provider]  levothyroxine (SYNTHROID) 125 MCG tablet TAKE 1 TABLET EVERY DAY ON EMPTY STOMACHWITH A GLASS OF WATER AT LEAST 30-60 MINBEFORE BREAKFAST 02/10/22   [name], MD  loperamide (IMODIUM) 2 MG capsule Take by mouth.    [provider]  metoprolol tartrate (LOPRESSOR) 25 MG tablet TAKE 1 TABLET (25 MG TOTAL) BY MOUTH AS NEEDED (AS NEEDED UP TO TWICE DAILY FOR PALPITATIONS). 09/04/20   Loel Dubonnet, NP  nitrofurantoin, macrocrystal-monohydrate, (MACROBID) 100 MG capsule Take 1 capsule (100 mg total) by mouth 2 (two) times daily. 04/03/22   Borders, Kirt Boys, NP  ondansetron (ZOFRAN) 8 MG tablet Take 1 tablet (8 mg total) by mouth 2 (two) times daily as needed for nausea or vomiting. 08/26/21   Darl Pikes, RPH-CPP  pantoprazole (PROTONIX) 40 MG tablet TAKE ONE TABLET (40 MG) BY MOUTH EVERY DAY 09/10/21   [name], MD  potassium chloride SA (KLOR-CON M) 20 MEQ tablet Take 20 mEq by mouth daily.    [provider]  venetoclax (VENCLEXTA) 100 MG tablet Take 4 tablets (400 mg total) by mouth daily. Take for 14 days, then hold for 14 days. Repeat every 28 days. Take with a meal and a full glass of water. 02/02/22   Darl Pikes, RPH-CPP    Physical Exam: Vitals:   04/04/22 1519 04/04/22 1535 04/04/22 1600 04/04/22 1700  BP:  (!) 118/47 (!) 120/48 (!) 104/52  Pulse:   87 90  Resp:  (!) 23 20   Temp: (!) 102.7 F (39.3 C)     TempSrc: Oral     SpO2:   98% 96%  Weight:      Height:       General: Not in acute distress HEENT:       Eyes: PERRL, EOMI, no scleral icterus.       ENT: No discharge from the ears and nose, no pharynx injection, no tonsillar enlargement.        Neck: No JVD, no bruit, no mass felt. Heme: No neck lymph node enlargement. Cardiac: S1/S2, RRR, No murmurs, No gallops or rubs. Respiratory: No rales, wheezing, rhonchi or rubs. GI: Soft, nondistended, nontender, no rebound pain, no  organomegaly, BS present. GU: No hematuria Ext:  No pitting leg edema bilaterally. 1+DP/PT pulse bilaterally. Musculoskeletal: No joint deformities, No joint redness or warmth, no limitation of ROM in spin. Skin: No rashes.  Neuro: Mildly confused, lethargic, slow response, still oriented X3, cranial nerves II-XII grossly intact, moves all extremities. Psych: Patient is not psychotic, no suicidal or hemocidal ideation.  Labs on Admission: I have personally reviewed following labs and imaging studies  CBC: Recent Labs  Lab 03/31/22 1628 03/31/22 1630 04/01/22 0446 04/02/22 0515 04/04/22 1050  WBC 0.9* 1.0* 0.9* 0.7* 0.5*  0.5*  NEUTROABS  --  0.4*  --  0.3* 0.3*  HGB 9.0* 8.9* 7.8* 7.0* 8.9*  9.1*  HCT 25.5* 25.7* 23.3* 21.2* 26.2*  26.6*  MCV 87.9 88.0 90.0 91.8 88.8  89.3  PLT 93* 93* 67* 58* 54*  55*   Basic Metabolic Panel: Recent Labs  Lab 03/31/22 1628 03/31/22 1630 04/01/22 0446 04/02/22 0515 04/04/22 1050 04/04/22 1206  NA 141  --  137 143 136  --   K 2.6*  --  3.7 4.0 2.5*  --   CL 111  --  113* 117* 103  --   CO2 23  --  19* 24 23  --   GLUCOSE 124*  --  114* 106* 136*  --   BUN 17  --  _0 --   CREATININE 1.07*  --  1.22* 0.78 0.82  --   CALCIUM 8.7*  --  7.8* 8.0* 8.2*  --   MG  --  1.5*  --  2.0  --  1.7  PHOS  --   --   --  2.9 2.4*  --    GFR: Estimated Creatinine Clearance: 53.6 mL/min (by C-G formula based on SCr of 0.82 mg/dL). Liver Function Tests: Recent Labs  Lab 03/31/22 1630 04/01/22 0446 04/02/22 0515  AST 16 13* 9*  ALT _1 ALKPHOS 78 69 68  BILITOT 1.2 1.3* 0.8  PROT 6.5 5.3* 5.3*  ALBUMIN 4.0 3.2* 3.1*   No results for input(s): "LIPASE", "AMYLASE" in the last 168 hours. No results for input(s): "AMMONIA" in the last 168 hours. Coagulation Profile: Recent Labs  Lab 04/04/22 1500  INR 1.3*   Cardiac Enzymes: No results for input(s): "CKTOTAL", "CKMB", "CKMBINDEX", "TROPONINI" in the last 168 hours. BNP  (last 3 results) No results for input(s): "PROBNP" in the last 8760 hours. HbA1C: No results for input(s): "HGBA1C" in the last 72 hours. CBG: No results for input(s): "GLUCAP" in the last 168 hours. Lipid Profile: No results for input(s): "CHOL", "HDL", "LDLCALC", "TRIG", "CHOLHDL", "LDLDIRECT" in the last 72 hours. Thyroid Function Tests: No results for input(s): "TSH", "T4TOTAL", "FREET4", "T3FREE", "THYROIDAB" in the last 72 hours. Anemia Panel: No results for input(s): "VITAMINB12", "FOLATE", "FERRITIN", "TIBC", "IRON", "RETICCTPCT" in the last 72 hours. Urine analysis:    Component Value Date/Time   COLORURINE YELLOW (A) 03/31/2022 1630   APPEARANCEUR HAZY (A) 03/31/2022 1630   APPEARANCEUR Hazy 11/30/2013 1821   LABSPEC 1.019 03/31/2022 1630   LABSPEC 1.023 11/30/2013 1821   PHURINE 5.0 03/31/2022 1630   GLUCOSEU NEGATIVE 03/31/2022 1630   GLUCOSEU NEGATIVE 09/11/2014 0810   HGBUR LARGE (A) 03/31/2022 1630   BILIRUBINUR NEGATIVE 03/31/2022 1630   BILIRUBINUR Negative 11/30/2013 1821   KETONESUR NEGATIVE 03/31/2022 1630   PROTEINUR 30 (A) 03/31/2022 1630   UROBILINOGEN 0.2 09/11/2014 0810   NITRITE NEGATIVE 03/31/2022 [place] 03/31/2022 1630   LEUKOCYTESUR Negative 11/30/2013 1821  Sepsis Labs: _0 (procalcitonin:4,lacticidven:4) ) Recent Results (from the past 240 hour(s))  Culture, blood (Routine X 2) w Reflex to ID Panel     Status: Abnormal   Collection Time: 04/01/22 10:20 AM   Specimen: BLOOD  Result Value Ref Range Status   Specimen Description   Final    BLOOD RIGHT ANTECUBITAL Performed at [facility], [address] [place], [place] [address]    Special Requests   Final    BOTTLES DRAWN AEROBIC AND ANAEROBIC Blood Culture results may not be optimal due to an excessive volume of blood received in culture bottles Performed at [facility], [address] [place], [place] [address]    Culture  Setup Time    Final    GRAM POSITIVE COCCI ANAEROBIC BOTTLE ONLY CRITICAL RESULT CALLED TO, READ BACK BY AND VERIFIED WITH: Rito Ehrlich PHARMD [id number] 04/02/22 HNM    Culture (A)  Final    STAPHYLOCOCCUS CAPITIS THE SIGNIFICANCE OF ISOLATING THIS ORGANISM FROM A SINGLE SET OF BLOOD CULTURES WHEN MULTIPLE SETS ARE DRAWN IS UNCERTAIN. PLEASE NOTIFY THE MICROBIOLOGY DEPARTMENT WITHIN ONE WEEK IF SPECIATION AND SENSITIVITIES ARE REQUIRED. Performed at [facility], [place] [address] [place], [place] [address]    Report Status 04/04/2022 FINAL  Final  Blood Culture ID Panel (Reflexed)     Status: Abnormal   Collection Time: 04/01/22 10:20 AM  Result Value Ref Range Status   Enterococcus faecalis NOT DETECTED NOT DETECTED Final   Enterococcus Faecium NOT DETECTED NOT DETECTED Final   Listeria monocytogenes NOT DETECTED NOT DETECTED Final   Staphylococcus species DETECTED (A) NOT DETECTED Final    Comment: CRITICAL RESULT CALLED TO, READ BACK BY AND VERIFIED WITH: Rito Ehrlich PHARMD [id number] 04/02/22 HNM    Staphylococcus aureus (BCID) NOT DETECTED NOT DETECTED Final   Staphylococcus epidermidis NOT DETECTED NOT DETECTED Final   Staphylococcus lugdunensis NOT DETECTED NOT DETECTED Final   Streptococcus species NOT DETECTED NOT DETECTED Final   Streptococcus agalactiae NOT DETECTED NOT DETECTED Final   Streptococcus pneumoniae NOT DETECTED NOT DETECTED Final   Streptococcus pyogenes NOT DETECTED NOT DETECTED Final   A.calcoaceticus-baumannii NOT DETECTED NOT DETECTED Final   Bacteroides fragilis NOT DETECTED NOT DETECTED Final   Enterobacterales NOT DETECTED NOT DETECTED Final   Enterobacter cloacae complex NOT DETECTED NOT DETECTED Final   Escherichia coli NOT DETECTED NOT DETECTED Final   Klebsiella aerogenes NOT DETECTED NOT DETECTED Final   Klebsiella oxytoca NOT DETECTED NOT DETECTED Final   Klebsiella pneumoniae NOT DETECTED NOT DETECTED Final   Proteus species NOT DETECTED NOT DETECTED Final    Salmonella species NOT DETECTED NOT DETECTED Final   Serratia marcescens NOT DETECTED NOT DETECTED Final   Haemophilus influenzae NOT DETECTED NOT DETECTED Final   Neisseria meningitidis NOT DETECTED NOT DETECTED Final   Pseudomonas aeruginosa NOT DETECTED NOT DETECTED Final   Stenotrophomonas maltophilia NOT DETECTED NOT DETECTED Final   Candida albicans NOT DETECTED NOT DETECTED Final   Candida auris NOT DETECTED NOT DETECTED Final   Candida glabrata NOT DETECTED NOT DETECTED Final   Candida krusei NOT DETECTED NOT DETECTED Final   Candida parapsilosis NOT DETECTED NOT DETECTED Final   Candida tropicalis NOT DETECTED NOT DETECTED Final   Cryptococcus neoformans/gattii NOT DETECTED NOT DETECTED Final    Comment: Performed at [facility], [address] [place], [place] [address]  Culture, blood (Routine X 2) w Reflex to ID Panel     Status: None (Preliminary result)  Collection Time: 04/01/22 10:25 AM   Specimen: BLOOD  Result Value Ref Range Status   Specimen Description BLOOD BLOOD LEFT ARM  Final   Special Requests   Final    BOTTLES DRAWN AEROBIC AND ANAEROBIC Blood Culture results may not be optimal due to an excessive volume of blood received in culture bottles   Culture   Final    NO GROWTH 3 DAYS Performed at [facility], [address], [place], [place] [address]    Report Status PENDING  Incomplete  Urine Culture     Status: Abnormal   Collection Time: 04/01/22 10:50 AM   Specimen: Urine, Random  Result Value Ref Range Status   Specimen Description   Final    URINE, RANDOM Performed at [facility], [address], [place], [place] [address]    Special Requests   Final    NONE Performed at [facility], [address] [place], [place] [address]    Culture MULTIPLE SPECIES PRESENT, SUGGEST RECOLLECTION (A)  Final   Report Status 04/02/2022 FINAL  Final  Resp Panel by RT-PCR (Flu A&B, Covid) Anterior Nasal Swab     Status:  Abnormal   Collection Time: 04/04/22 12:07 PM   Specimen: Anterior Nasal Swab  Result Value Ref Range Status   SARS Coronavirus 2 by RT PCR NEGATIVE NEGATIVE Final    Comment: (NOTE) SARS-CoV-2 target nucleic acids are NOT DETECTED.  The SARS-CoV-2 RNA is generally detectable in upper respiratory specimens during the acute phase of infection. The lowest concentration of SARS-CoV-2 viral copies this assay can detect is 138 copies/mL. A negative result does not preclude SARS-Cov-2 infection and should not be used as the sole basis for treatment or other patient management decisions. A negative result may occur with  improper specimen collection/handling, submission of specimen other than nasopharyngeal swab, presence of viral mutation(s) within the areas targeted by this assay, and inadequate number of viral copies(<138 copies/mL). A negative result must be combined with clinical observations, patient history, and epidemiological information. The expected result is Negative.  Fact Sheet for Patients:  EntrepreneurPulse.com.au  Fact Sheet for Healthcare Providers:  IncredibleEmployment.be  This test is no t yet approved or cleared by the [place] FDA and  has been authorized for detection and/or diagnosis of SARS-CoV-2 by FDA under an Emergency Use Authorization (EUA). This EUA will remain  in effect (meaning this test can be used) for the duration of the COVID-19 declaration under Section 564(b)(1) of the Act, 21 U.S.C.section 360bbb-3(b)(1), unless the authorization is terminated  or revoked sooner.       Influenza A by PCR POSITIVE (A) NEGATIVE Final   Influenza B by PCR NEGATIVE NEGATIVE Final    Comment: (NOTE) The Xpert Xpress SARS-CoV-2/FLU/RSV plus assay is intended as an aid in the diagnosis of influenza from Nasopharyngeal swab specimens and should not be used as a sole basis for treatment. Nasal washings and aspirates are  unacceptable for Xpert Xpress SARS-CoV-2/FLU/RSV testing.  Fact Sheet for Patients: EntrepreneurPulse.com.au  Fact Sheet for Healthcare Providers: IncredibleEmployment.be  This test is not yet approved or cleared by the [place] FDA and has been authorized for detection and/or diagnosis of SARS-CoV-2 by FDA under an Emergency Use Authorization (EUA). This EUA will remain in effect (meaning this test can be used) for the duration of the COVID-19 declaration under Section 564(b)(1) of the Act, 21 U.S.C. section 360bbb-3(b)(1), unless the authorization is terminated or revoked.  Performed at [facility],  [place], [place] [address]      Radiological Exams on Admission: DG Chest Portable 1 View  Result Date: 04/04/2022 CLINICAL DATA:  Confusion and altered mental status this morning. EXAM: PORTABLE CHEST 1 VIEW COMPARISON:  November 15, 2020 FINDINGS: The mediastinal contour is normal. The heart size is mildly enlarged. Increased central pulmonary vessel caliber noted. There is no focal pneumonia, pulmonary edema or pleural effusion. The visualized skeletal structures are stable. IMPRESSION: Mild congestive heart failure. Electronically Signed   By: Abelardo Diesel M.D.   On: 04/04/2022 12:03   CT HEAD WO CONTRAST (5MM)  Result Date: 04/04/2022 CLINICAL DATA:  Mental status change with unknown cause EXAM: CT HEAD WITHOUT CONTRAST TECHNIQUE: Contiguous axial images were obtained from the base of the skull through the vertex without intravenous contrast. RADIATION DOSE REDUCTION: This exam was performed according to the departmental dose-optimization program which includes automated exposure control, adjustment of the mA and/or kV according to patient size and/or use of iterative reconstruction technique. COMPARISON:  04/29/2021 FINDINGS: Brain: No evidence of acute infarction, hemorrhage, hydrocephalus, extra-axial collection or mass  lesion/mass effect. Vascular: No hyperdense vessel or unexpected calcification. Skull: Normal. Negative for fracture or focal lesion. Sinuses/Orbits: Extensive opacification of the right maxillary sinus, chronic when compared to prior and with sclerotic wall thickening. Nonspecific calcification lateral to the left pterygoid body, stable and non worrisome. IMPRESSION: 1. No acute or interval finding. 2. Chronic, focal right maxillary sinusitis. Electronically Signed   By: Jorje Guild M.D.   On: 04/04/2022 11:11      Assessment/Plan Principal Problem:   Influenza A Active Problems:   Severe sepsis (HCC)   Neutropenia with fever (HCC)   Acute metabolic encephalopathy   Diarrhea   Pancytopenia (HCC)   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HCC)   Hypokalemia   Hypophosphatemia   Chronic diastolic CHF (congestive heart failure) ([place])   Hypothyroidism   Depression with anxiety   Assessment and Plan:  Severe sepsis due to Influenza A: Patient has positive flu A PCR.  She meets criteria for severe sepsis with WBC 0.5, heart rate 93, RR 21, fever 102.7.  Lactic acid 2.3, 2.0.  -Admitted to telemetry bed as inpatient -Start Tamiflu 75 mg twice daily -will get Procalcitonin and trend lactic acid levels per sepsis protocol. -IVF: 1L of NS bolus in ED (patient has a congestive heart failure with elevated BNP 369, limiting aggressive IV fluids treatment). -prn tylenol for fever  Neutropenia with fever ([place]): pt has Flu A, which is likely the source of her fever, but cannot completely rule out bacterial infection due to neutropenia. -Started vancomycin and cefepime --> if culture negative, may d/c both -Blood culture -Follow-up urine analysis and urine culture -Neutropenia precaution  Acute metabolic encephalopathy: Likely multifactorial etiology, including flu A, electrolytes disturbance and severe sepsis.  CT head negative. -Will continue check  Diarrhea: This is chronic issue. -check C  diff and GI path panel  Pancytopenia ([place]): Hemoglobin is better than recent baseline. -Follow-up with CBC  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[place]): pt is following up with Dr. [name] of oncology -Messages sent to Dr. Rogue Bussing who is on-call for oncology  Hypokalemia and phosphatemia: Potassium 2.5, phosphorus 2.4.  Magnesium 2.0 -Repleted potassium and phosphorus  Chronic diastolic CHF (congestive heart failure) ([place]): 2D echo on 09/05/2020 showed EF acetic acid 5% with grade 1 diastolic dysfunction.  BNP 369, chest x-ray showed mild interstitial edema, but no leg edema or JVD.  No shortness of  breath.  Does not seem to have acute CHF exacerbation -Watch volume status closely -Will not give diuretics due to severe sepsis  Hypothyroidism -Synthroid  Depression with anxiety -Continue home medications   DVT ppx: SCD  Code Status: Full code (patient is confused, we cannot discuss CODE STATUS appropriately.  I talked to her sister by phone.  Per her sister, we can temporarily put full code order now)  Family Communication: Yes, patient's sister by phone  Disposition Plan:  Anticipate discharge back to previous environment  Consults called: message sent to Dr. Rogue Bussing of oncology   Admission status and Level of care: Telemetry Medical:     for obs    Dispo: The patient is from: Home              Anticipated d/c is to: Home              Anticipated d/c date is: 1 day              Patient currently is not medically stable to d/c.    Severity of Illness:  The appropriate patient status for this patient is OBSERVATION. Observation status is judged to be reasonable and necessary in order to provide the required intensity of service to ensure the patient's safety. The patient's presenting symptoms, physical exam findings, and initial radiographic and laboratory data in the context of their medical condition is felt to place them at decreased risk for further clinical  deterioration. Furthermore, it is anticipated that the patient will be medically stable for discharge from the hospital within 2 midnights of admission.        Date of Service 04/04/2022    Ivor Costa [name]   If 7PM-7AM, please contact night-coverage [email or web address] 04/04/2022, 5:40 PM

## 2022-04-05 ENCOUNTER — Other Ambulatory Visit: Payer: Self-pay

## 2022-04-05 ENCOUNTER — Encounter: Payer: Self-pay | Admitting: Internal Medicine

## 2022-04-05 DIAGNOSIS — J101 Influenza due to other identified influenza virus with other respiratory manifestations: Secondary | ICD-10-CM | POA: Diagnosis not present

## 2022-04-05 LAB — GASTROINTESTINAL PANEL BY PCR, STOOL (REPLACES STOOL CULTURE)

## 2022-04-05 LAB — URINALYSIS, ROUTINE W REFLEX MICROSCOPIC
Bacteria, UA: NONE SEEN
Bilirubin Urine: NEGATIVE
Glucose, UA: NEGATIVE mg/dL
Ketones, ur: 5 mg/dL — AB
Leukocytes,Ua: NEGATIVE
Nitrite: NEGATIVE
Protein, ur: 100 mg/dL — AB
Specific Gravity, Urine: 1.018 (ref 1.005–1.030)
pH: 5 (ref 5.0–8.0)

## 2022-04-05 LAB — CBC
HCT: 23.3 % — ABNORMAL LOW (ref 36.0–46.0)
Hemoglobin: 7.8 g/dL — ABNORMAL LOW (ref 12.0–15.0)
MCH: 30.4 pg (ref 26.0–34.0)
MCHC: 33.5 g/dL (ref 30.0–36.0)
MCV: 90.7 fL (ref 80.0–100.0)
Platelets: 40 10*3/uL — ABNORMAL LOW (ref 150–400)
RBC: 2.57 MIL/uL — ABNORMAL LOW (ref 3.87–5.11)
RDW: 16.2 % — ABNORMAL HIGH (ref 11.5–15.5)
WBC: 0.4 10*3/uL — CL (ref 4.0–10.5)
nRBC: 0 % (ref 0.0–0.2)

## 2022-04-05 LAB — BASIC METABOLIC PANEL
Anion gap: 7 (ref 5–15)
BUN: 12 mg/dL (ref 8–23)
CO2: 19 mmol/L — ABNORMAL LOW (ref 22–32)
Calcium: 7.9 mg/dL — ABNORMAL LOW (ref 8.9–10.3)
Chloride: 113 mmol/L — ABNORMAL HIGH (ref 98–111)
Creatinine, Ser: 0.82 mg/dL (ref 0.44–1.00)
GFR, Estimated: >60 mL/min (ref >60)
Glucose, Bld: 115 mg/dL — ABNORMAL HIGH (ref 70–99)
Potassium: 2.7 mmol/L — CL (ref 3.5–5.1)
Sodium: 139 mmol/L (ref 135–145)

## 2022-04-05 LAB — C DIFFICILE QUICK SCREEN W PCR REFLEX
C Diff antigen: NEGATIVE
C Diff interpretation: NOT DETECTED
C Diff toxin: NEGATIVE

## 2022-04-05 LAB — PHOSPHORUS: Phosphorus: 3.1 mg/dL (ref 2.5–4.6)

## 2022-04-05 LAB — POTASSIUM: Potassium: 2.9 mmol/L — ABNORMAL LOW (ref 3.5–5.1)

## 2022-04-05 MED ORDER — POTASSIUM CHLORIDE 10 MEQ/100ML IV SOLN
10.0000 meq | INTRAVENOUS | Status: AC
  Administered 2022-04-05 (×5): 10 meq via INTRAVENOUS
  Filled 2022-04-05 (×4): qty 100

## 2022-04-05 NOTE — ED Notes (Signed)
Patient states she felt nauseous. Zofran obtained from pyxis.

## 2022-04-05 NOTE — Assessment & Plan Note (Signed)
Follow lipid panel.   

## 2022-04-05 NOTE — Assessment & Plan Note (Signed)
On thyroid replacement.  Follow tsh.  

## 2022-04-05 NOTE — Assessment & Plan Note (Signed)
Seeing Dr [name].  Undergoing treatments as outlined. Discussed.

## 2022-04-05 NOTE — Assessment & Plan Note (Signed)
Stable

## 2022-04-05 NOTE — Assessment & Plan Note (Signed)
Does well with dymista.  Headache resolves with dymista.

## 2022-04-05 NOTE — Assessment & Plan Note (Signed)
No upper symptoms reported.  On protonix.   

## 2022-04-05 NOTE — Assessment & Plan Note (Signed)
Seeing Dr Clovis Riley.  Currently on lexapro and clonazepam.  Denies SI.  Follow.  Continue f/u with psychiatry.

## 2022-04-05 NOTE — Assessment & Plan Note (Signed)
Continue lexapro.  Continue f/u with Dr Clovis Riley

## 2022-04-05 NOTE — ED Notes (Signed)
Redraw green and lavender tubes as per lab.

## 2022-04-05 NOTE — Assessment & Plan Note (Signed)
Taking imodium. Persistent issue.  Has seen GI.  Consider f/u - question of any further intervention to help with persistent loose stool.

## 2022-04-05 NOTE — Progress Notes (Signed)
PROGRESS NOTE    Debra Hale   LPF:[id number] DOB: 07-Oct-1950  DOA: 04/04/2022 Date of Service: 04/05/22 PCP: [name], MD     Brief Narrative / Hospital Course:  Debra Hale is a 71 y.o. 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  She was recently hospitalized from 11/28 - 11/30 due to right flank pain with hydronephrosis, no obstructive stone, likely recently passed stone.  Patient was treated with antibiotics for complicated UTI.  12/02:  in ED, she is mildly confused, lethargic, with slow responsiveness.  Patient moves all extremities normally.  No facial droop or slurred speech.  She is orientated x 3. (+) pancytopenia with positive Flu A, WBC 0.5, hemoglobin 8.9, platelet 53 (recent baseline WBC 0.7, hemoglobin 7.0, platelet 58 on 04/02/2022), UA(+) blood but no UTI. Cdiff and GI panel neg. COVID PCR negative, GFR> 60, potassium 2.5, magnesium 2.0.  Temperature up to 102.7, blood pressure 130/51, heart rate 93, RR 21, SpO2 94% on room air.  Chest x-ray mild interstitial pulmonary edema.  CT head negative.  Patient is admitted to telemetry bed as inpatient. Message sent to Dr. Rogue Bussing of oncology for consult. 12/03: febrile to 102.6 early this AM. Remains pancytopenic, drop Hgb likely hemodilutional. Lactic acid trended down. Still significantly hypokalemic. Has been on room air and comfortable.    Consultants:  Oncology   Procedures: None       ASSESSMENT & PLAN:   Principal Problem:   Influenza A Active Problems:   Severe sepsis (HCC)   Neutropenia  with fever (HCC)   Acute metabolic encephalopathy   Diarrhea   Pancytopenia (HCC)   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HCC)   Hypokalemia   Hypophosphatemia   Chronic diastolic CHF (congestive heart failure) (HCC)   Hypothyroidism   Depression with anxiety  Severe sepsis due to Influenza A:  Patient has positive flu A PCR.   meets criteria for severe sepsis on admission with WBC 0.5, heart rate 93, RR 21, fever 102.7.  Lactic acid 2.3, 2.0. Tamiflu 75 mg twice daily Trend Procalcitonin and trend lactic acid levels per sepsis protocol. IVF: 1L of NS bolus in ED (patient has a congestive heart failure with elevated BNP 369, limiting aggressive IV fluids treatment). prn tylenol for fever   Neutropenia with fever ([place]):  pt has Flu A, which is likely the source of her fever cannot completely rule out bacterial infection due to neutropenia. Started vancomycin and cefepime 04/04/2022 --> if culture negative, may d/c both Blood culture Follow-up urine culture Neutropenia precaution HemOnc consult    Acute metabolic encephalopathy - improved:  Likely multifactorial etiology, including flu A, electrolytes disturbance and severe sepsis.   CT head negative. Monitor    Diarrhea:  This is chronic issue. negative C diff and GI path panel   Pancytopenia ([place]):  Hemoglobin is better than recent baseline. Follow-up with CBC HemOnc consult    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[place]):  pt is following up with Dr. [name] of oncology Message sent to Dr. Rogue Bussing who is on-call for oncology   Hypokalemia and phosphatemia:  Repleted potassium and phosphorus Follow labs    Chronic diastolic CHF (congestive heart failure) [facility]):  2D echo  on 09/05/2020 showed EF acetic acid 5% with grade 1 diastolic dysfunction.  BNP 369, chest x-ray showed mild interstitial edema, but no leg edema or JVD.  No shortness of breath.  Does not seem to have acute CHF exacerbation Watch volume status  closely Will not give diuretics due to severe sepsis   Hypothyroidism Synthroid   Depression with anxiety Continue home medications        DVT prophylaxis: SCD Pertinent IV fluids/nutrition: no continuous IV fluids  Central lines / invasive devices: none  Code Status: FULL CODE   Disposition: inpatient  TOC needs: none at this time  Barriers to discharge / significant pending items: IV abx pending cultures, onc consult, severe hypokalemia              Subjective:  Patient reports feeling better this morning  Denies CP/SOB.  Pain controlled.  Denies new weakness.  Tolerating diet.  Reports no concerns w/ urination/defecation.    Objective Findings:  Vitals:   04/05/22 0600 04/05/22 0756 04/05/22 1200 04/05/22 1231  BP: (!) 105/54 (!) 122/54 139/66   Pulse: 69 74 76   Resp: '20 18 16   '$ Temp: 98.2 F (36.8 C) 98 F (36.7 C)  98.1 F (36.7 C)  TempSrc: Oral Oral  Oral  SpO2: 94% 100% (!) 87%   Weight:      Height:        Intake/Output Summary (Last 24 hours) at 04/05/2022 1403 Last data filed at 04/05/2022 1206 Gross per 24 hour  Intake 1679.99 ml  Output --  Net 1679.99 ml   Filed Weights   04/04/22 1037  Weight: 61 kg    Examination:  Constitutional:  VS as above General Appearance: alert, well-developed, well-nourished, NAD Respiratory: Normal respiratory effort No wheeze No rhonchi No rales Cardiovascular: S1/S2 normal + murmur RRR Gastrointestinal: No tenderness Musculoskeletal:  No clubbing/cyanosis of digits Symmetrical movement in all extremities Neurological: No cranial nerve deficit on limited exam Alert Psychiatric: Normal judgment/insight Normal mood and affect       Scheduled Medications:   escitalopram  20 mg Oral BH-q7a   levothyroxine  125 mcg Oral Q0600   oseltamivir  75 mg Oral BID   pantoprazole  40 mg Oral Daily    Continuous Infusions:  ceFEPime (MAXIPIME) IV Stopped (04/05/22 0754)    potassium chloride 100 mL/hr at 04/05/22 1229   vancomycin      PRN Medications:  acetaminophen, albuterol, clonazePAM, dextromethorphan-guaiFENesin, diphenoxylate-atropine, ondansetron (ZOFRAN) IV  Antimicrobials:  Anti-infectives (From admission, onward)    Start     Dose/Rate Route Frequency Ordered Stop   04/05/22 1700  vancomycin (VANCOCIN) IVPB 1000 mg/200 mL premix        1,000 mg 200 mL/hr over 60 Minutes Intravenous Every 24 hours 04/04/22 1604     04/04/22 2200  oseltamivir (TAMIFLU) capsule 75 mg        75 mg Oral 2 times daily 04/04/22 1548 04/09/22 2159   04/04/22 1630  vancomycin (VANCOREADY) IVPB 1250 mg/250 mL        1,250 mg 166.7 mL/hr over 90 Minutes Intravenous  Once 04/04/22 1604 04/04/22 1850   04/04/22 1630  ceFEPIme (MAXIPIME) 2 g in sodium chloride 0.9 % 100 mL IVPB        2 g 200 mL/hr over 30 Minutes Intravenous Every 12 hours 04/04/22 1604             Data Reviewed: I have personally reviewed following labs and imaging studies  CBC: Recent Labs  Lab 03/31/22 1630 04/01/22 0446 04/02/22 0515 04/04/22 1050 04/05/22 0753  WBC 1.0* 0.9* 0.7* 0.5*  0.5* 0.4*  NEUTROABS 0.4*  --  0.3* 0.3*  --   HGB 8.9* 7.8* 7.0* 8.9*  9.1* 7.8*  HCT 25.7* 23.3* 21.2* 26.2*  26.6* 23.3*  MCV 88.0 90.0 91.8 88.8  89.3 90.7  PLT 93* 67* 58* 54*  55* 40*   Basic Metabolic Panel: Recent Labs  Lab 03/31/22 1628 03/31/22 1630 04/01/22 0446 04/02/22 0515 04/04/22 1050 04/04/22 1206 04/05/22 0753  NA 141  --  137 143 136  --  139  K 2.6*  --  3.7 4.0 2.5*  --  2.7*  CL 111  --  113* 117* 103  --  113*  CO2 23  --  19* 24 23  --  19*  GLUCOSE 124*  --  114* 106* 136*  --  115*  BUN 17  --  '16 16 14  '$ --  12  CREATININE 1.07*  --  1.22* 0.78 0.82  --  0.82  CALCIUM 8.7*  --  7.8* 8.0* 8.2*  --  7.9*  MG  --  1.5*  --  2.0  --  1.7  --   PHOS  --   --   --  2.9 2.4*  --  3.1   GFR: Estimated Creatinine Clearance: 53.6 mL/min (by C-G formula based on  SCr of 0.82 mg/dL). Liver Function Tests: Recent Labs  Lab 03/31/22 1630 04/01/22 0446 04/02/22 0515  AST 16 13* 9*  ALT '21 20 17  '$ ALKPHOS 78 69 68  BILITOT 1.2 1.3* 0.8  PROT 6.5 5.3* 5.3*  ALBUMIN 4.0 3.2* 3.1*   No results for input(s): "LIPASE", "AMYLASE" in the last 168 hours. No results for input(s): "AMMONIA" in the last 168 hours. Coagulation Profile: Recent Labs  Lab 04/04/22 1500  INR 1.3*   Cardiac Enzymes: No results for input(s): "CKTOTAL", "CKMB", "CKMBINDEX", "TROPONINI" in the last 168 hours. BNP (last 3 results) No results for input(s): "PROBNP" in the last 8760 hours. HbA1C: No results for input(s): "HGBA1C" in the last 72 hours. CBG: No results for input(s): "GLUCAP" in the last 168 hours. Lipid Profile: No results for input(s): "CHOL", "HDL", "LDLCALC", "TRIG", "CHOLHDL", "LDLDIRECT" in the last 72 hours. Thyroid Function Tests: No results for input(s): "TSH", "T4TOTAL", "FREET4", "T3FREE", "THYROIDAB" in the last 72 hours. Anemia Panel: No results for input(s): "VITAMINB12", "FOLATE", "FERRITIN", "TIBC", "IRON", "RETICCTPCT" in the last 72 hours. Most Recent Urinalysis On File:     Component Value Date/Time   COLORURINE YELLOW (A) 04/04/2022 2351   APPEARANCEUR CLEAR (A) 04/04/2022 2351   APPEARANCEUR Hazy 11/30/2013 1821   LABSPEC 1.018 04/04/2022 2351   LABSPEC 1.023 11/30/2013 1821   PHURINE 5.0 04/04/2022 2351   GLUCOSEU NEGATIVE 04/04/2022 2351   GLUCOSEU NEGATIVE 09/11/2014 0810   HGBUR LARGE (A) 04/04/2022 2351   BILIRUBINUR NEGATIVE 04/04/2022 2351   BILIRUBINUR Negative 11/30/2013 1821   KETONESUR 5 (A) 04/04/2022 2351   PROTEINUR 100 (A) 04/04/2022 2351   UROBILINOGEN 0.2 09/11/2014 0810   NITRITE NEGATIVE 04/04/2022 2351   LEUKOCYTESUR NEGATIVE 04/04/2022 2351   LEUKOCYTESUR Negative 11/30/2013 1821   Sepsis Labs: '@LABRCNTIP'$ (procalcitonin:4,lacticidven:4)  Recent Results (from the past 240 hour(s))  Culture, blood (Routine X  2) w Reflex to ID Panel     Status: Abnormal   Collection Time: 04/01/22 10:20 AM   Specimen: BLOOD  Result Value Ref Range Status  Specimen Description   Final    BLOOD RIGHT ANTECUBITAL Performed at [facility], [address] [place], [place] [address]    Special Requests   Final    BOTTLES DRAWN AEROBIC AND ANAEROBIC Blood Culture results may not be optimal due to an excessive volume of blood received in culture bottles Performed at [facility], [address], [place], [place] [address]    Culture  Setup Time   Final    GRAM POSITIVE COCCI ANAEROBIC BOTTLE ONLY CRITICAL RESULT CALLED TO, READ BACK BY AND VERIFIED WITH: Rito Ehrlich [id number] 04/02/22 HNM    Culture (A)  Final    STAPHYLOCOCCUS CAPITIS THE SIGNIFICANCE OF ISOLATING THIS ORGANISM FROM A SINGLE SET OF BLOOD CULTURES WHEN MULTIPLE SETS ARE DRAWN IS UNCERTAIN. PLEASE NOTIFY THE MICROBIOLOGY DEPARTMENT WITHIN ONE WEEK IF SPECIATION AND SENSITIVITIES ARE REQUIRED. Performed at [facility], [place] [address] [place], [place] [address]    Report Status 04/04/2022 FINAL  Final  Blood Culture ID Panel (Reflexed)     Status: Abnormal   Collection Time: 04/01/22 10:20 AM  Result Value Ref Range Status   Enterococcus faecalis NOT DETECTED NOT DETECTED Final   Enterococcus Faecium NOT DETECTED NOT DETECTED Final   Listeria monocytogenes NOT DETECTED NOT DETECTED Final   Staphylococcus species DETECTED (A) NOT DETECTED Final    Comment: CRITICAL RESULT CALLED TO, READ BACK BY AND VERIFIED WITH: Rito Ehrlich [id number] [id number] 04/02/22 HNM    Staphylococcus aureus (BCID) NOT DETECTED NOT DETECTED Final   Staphylococcus epidermidis NOT DETECTED NOT DETECTED Final   Staphylococcus lugdunensis NOT DETECTED NOT DETECTED Final   Streptococcus species NOT DETECTED NOT DETECTED Final   Streptococcus agalactiae NOT DETECTED NOT DETECTED Final   Streptococcus pneumoniae NOT DETECTED NOT DETECTED Final    Streptococcus pyogenes NOT DETECTED NOT DETECTED Final   A.calcoaceticus-baumannii NOT DETECTED NOT DETECTED Final   Bacteroides fragilis NOT DETECTED NOT DETECTED Final   Enterobacterales NOT DETECTED NOT DETECTED Final   Enterobacter cloacae complex NOT DETECTED NOT DETECTED Final   Escherichia coli NOT DETECTED NOT DETECTED Final   Klebsiella aerogenes NOT DETECTED NOT DETECTED Final   Klebsiella oxytoca NOT DETECTED NOT DETECTED Final   Klebsiella pneumoniae NOT DETECTED NOT DETECTED Final   Proteus species NOT DETECTED NOT DETECTED Final   Salmonella species NOT DETECTED NOT DETECTED Final   Serratia marcescens NOT DETECTED NOT DETECTED Final   Haemophilus influenzae NOT DETECTED NOT DETECTED Final   Neisseria meningitidis NOT DETECTED NOT DETECTED Final   Pseudomonas aeruginosa NOT DETECTED NOT DETECTED Final   Stenotrophomonas maltophilia NOT DETECTED NOT DETECTED Final   Candida albicans NOT DETECTED NOT DETECTED Final   Candida auris NOT DETECTED NOT DETECTED Final   Candida glabrata NOT DETECTED NOT DETECTED Final   Candida krusei NOT DETECTED NOT DETECTED Final   Candida parapsilosis NOT DETECTED NOT DETECTED Final   Candida tropicalis NOT DETECTED NOT DETECTED Final   Cryptococcus neoformans/gattii NOT DETECTED NOT DETECTED Final    Comment: Performed at [facility], [address] [place], [place] [address]  Culture, blood (Routine X 2) w Reflex to ID Panel     Status: None (Preliminary result)   Collection Time: 04/01/22 10:25 AM   Specimen: BLOOD  Result Value Ref Range Status   Specimen Description BLOOD BLOOD LEFT ARM  Final   Special Requests   Final    BOTTLES DRAWN AEROBIC AND ANAEROBIC Blood Culture results may not be optimal  due to an excessive volume of blood received in culture bottles   Culture   Final    NO GROWTH 4 DAYS Performed at [facility], [address] [place], [place] [address]    Report Status PENDING  Incomplete  Urine  Culture     Status: Abnormal   Collection Time: 04/01/22 10:50 AM   Specimen: Urine, Random  Result Value Ref Range Status   Specimen Description   Final    URINE, RANDOM Performed at [facility], [address], [place], [place] [address]    Special Requests   Final    NONE Performed at [facility], [address] [place], [place] [address]    Culture MULTIPLE SPECIES PRESENT, SUGGEST RECOLLECTION (A)  Final   Report Status 04/02/2022 FINAL  Final  Resp Panel by RT-PCR (Flu A&B, Covid) Anterior Nasal Swab     Status: Abnormal   Collection Time: 04/04/22 12:07 PM   Specimen: Anterior Nasal Swab  Result Value Ref Range Status   SARS Coronavirus 2 by RT PCR NEGATIVE NEGATIVE Final    Comment: (NOTE) SARS-CoV-2 target nucleic acids are NOT DETECTED.  The SARS-CoV-2 RNA is generally detectable in upper respiratory specimens during the acute phase of infection. The lowest concentration of SARS-CoV-2 viral copies this assay can detect is 138 copies/mL. A negative result does not preclude SARS-Cov-2 infection and should not be used as the sole basis for treatment or other patient management decisions. A negative result may occur with  improper specimen collection/handling, submission of specimen other than nasopharyngeal swab, presence of viral mutation(s) within the areas targeted by this assay, and inadequate number of viral copies(<138 copies/mL). A negative result must be combined with clinical observations, patient history, and epidemiological information. The expected result is Negative.  Fact Sheet for Patients:  EntrepreneurPulse.com.au  Fact Sheet for Healthcare Providers:  IncredibleEmployment.be  This test is no t yet approved or cleared by the [place] FDA and  has been authorized for detection and/or diagnosis of SARS-CoV-2 by FDA under an Emergency Use Authorization (EUA). This EUA will remain  in effect  (meaning this test can be used) for the duration of the COVID-19 declaration under Section 564(b)(1) of the Act, 21 U.S.C.section 360bbb-3(b)(1), unless the authorization is terminated  or revoked sooner.       Influenza A by PCR POSITIVE (A) NEGATIVE Final   Influenza B by PCR NEGATIVE NEGATIVE Final    Comment: (NOTE) The Xpert Xpress SARS-CoV-2/FLU/RSV plus assay is intended as an aid in the diagnosis of influenza from Nasopharyngeal swab specimens and should not be used as a sole basis for treatment. Nasal washings and aspirates are unacceptable for Xpert Xpress SARS-CoV-2/FLU/RSV testing.  Fact Sheet for Patients: EntrepreneurPulse.com.au  Fact Sheet for Healthcare Providers: IncredibleEmployment.be  This test is not yet approved or cleared by the [place] FDA and has been authorized for detection and/or diagnosis of SARS-CoV-2 by FDA under an Emergency Use Authorization (EUA). This EUA will remain in effect (meaning this test can be used) for the duration of the COVID-19 declaration under Section 564(b)(1) of the Act, 21 U.S.C. section 360bbb-3(b)(1), unless the authorization is terminated or revoked.  Performed at [facility], [address] [place], [place] [address]   Culture, blood (x 2)     Status: None (Preliminary result)   Collection Time: 04/04/22  3:01 PM   Specimen: BLOOD  Result Value Ref Range Status   Specimen Description BLOOD RIGHT HAND  Final   Special Requests   Final    BOTTLES DRAWN AEROBIC AND ANAEROBIC Blood Culture adequate volume   Culture   Final    NO GROWTH < 24 HOURS Performed at [facility], [address] [place], [place] [address]    Report Status PENDING  Incomplete  Culture, blood (x 2)     Status: None (Preliminary result)   Collection Time: 04/04/22  3:01 PM   Specimen: BLOOD  Result Value Ref Range Status   Specimen Description BLOOD LEFT ARM  Final   Special  Requests   Final    BOTTLES DRAWN AEROBIC AND ANAEROBIC Blood Culture adequate volume   Culture   Final    NO GROWTH < 24 HOURS Performed at [facility], [address] [place], [place] [address]    Report Status PENDING  Incomplete  C Difficile Quick Screen w PCR reflex     Status: None   Collection Time: 04/04/22 11:59 PM   Specimen: Urine, Clean Catch; Stool  Result Value Ref Range Status   C Diff antigen NEGATIVE NEGATIVE Final   C Diff toxin NEGATIVE NEGATIVE Final   C Diff interpretation No C. difficile detected.  Final    Comment: Performed at [facility], [address] [place], [place] [address]  Gastrointestinal Panel by PCR , Stool     Status: None   Collection Time: 04/04/22 11:59 PM   Specimen: Urine, Clean Catch; Stool  Result Value Ref Range Status   Campylobacter species NOT DETECTED NOT DETECTED Final   Plesimonas shigelloides NOT DETECTED NOT DETECTED Final   Salmonella species NOT DETECTED NOT DETECTED Final   Yersinia enterocolitica NOT DETECTED NOT DETECTED Final   Vibrio species NOT DETECTED NOT DETECTED Final   Vibrio cholerae NOT DETECTED NOT DETECTED Final   Enteroaggregative E coli (EAEC) NOT DETECTED NOT DETECTED Final   Enteropathogenic E coli (EPEC) NOT DETECTED NOT DETECTED Final   Enterotoxigenic E coli (ETEC) NOT DETECTED NOT DETECTED Final   Shiga like toxin producing E coli (STEC) NOT DETECTED NOT DETECTED Final   Shigella/Enteroinvasive E coli (EIEC) NOT DETECTED NOT DETECTED Final   Cryptosporidium NOT DETECTED NOT DETECTED Final   Cyclospora cayetanensis NOT DETECTED NOT DETECTED Final   Entamoeba histolytica NOT DETECTED NOT DETECTED Final   Giardia lamblia NOT DETECTED NOT DETECTED Final   Adenovirus F40/41 NOT DETECTED NOT DETECTED Final   Astrovirus NOT DETECTED NOT DETECTED Final   Norovirus GI/GII NOT DETECTED NOT DETECTED Final   Rotavirus A NOT DETECTED NOT DETECTED Final   Sapovirus (I, II, IV, and V) NOT  DETECTED NOT DETECTED Final    Comment: Performed at [facility], [address] [place], [place] [address]         Radiology Studies: DG Chest Portable 1 View  Result Date: 04/04/2022 CLINICAL DATA:  Confusion and altered mental status this morning. EXAM: PORTABLE CHEST 1 VIEW COMPARISON:  November 15, 2020 FINDINGS: The mediastinal contour is normal. The heart size is mildly enlarged. Increased central pulmonary vessel caliber noted. There is no focal pneumonia, pulmonary edema or pleural effusion. The visualized skeletal structures are stable. IMPRESSION: Mild congestive heart failure. Electronically Signed   By: Abelardo Diesel M.D.   On: 04/04/2022 12:03   CT HEAD WO CONTRAST (5MM)  Result Date: 04/04/2022 CLINICAL DATA:  Mental status change with unknown cause EXAM: CT HEAD WITHOUT CONTRAST TECHNIQUE: Contiguous axial images were obtained from the base of the skull through  the vertex without intravenous contrast. RADIATION DOSE REDUCTION: This exam was performed according to the departmental dose-optimization program which includes automated exposure control, adjustment of the mA and/or kV according to patient size and/or use of iterative reconstruction technique. COMPARISON:  04/29/2021 FINDINGS: Brain: No evidence of acute infarction, hemorrhage, hydrocephalus, extra-axial collection or mass lesion/mass effect. Vascular: No hyperdense vessel or unexpected calcification. Skull: Normal. Negative for fracture or focal lesion. Sinuses/Orbits: Extensive opacification of the right maxillary sinus, chronic when compared to prior and with sclerotic wall thickening. Nonspecific calcification lateral to the left pterygoid body, stable and non worrisome. IMPRESSION: 1. No acute or interval finding. 2. Chronic, focal right maxillary sinusitis. Electronically Signed   By: Jorje Guild M.D.   On: 04/04/2022 11:11            LOS: 1 day      Emeterio Reeve, DO [facility] 04/05/2022, 2:03 PM    Dictation software may have been used to generate the above note. Typos may occur and escape review in typed/dictated notes. Please contact Dr Sheppard Coil directly for clarity if needed.  Staff may message me via secure chat in [place]  but this may not receive an immediate response,  please page me for urgent matters!  If 7PM-7AM, please contact night coverage [email or web address]

## 2022-04-05 NOTE — Assessment & Plan Note (Signed)
Mammogram 07/29/21 - Birads II.

## 2022-04-05 NOTE — Hospital Course (Addendum)
Debra Hale is a 71 y.o. 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  She was recently hospitalized from 11/28 - 11/30 due to right flank pain with hydronephrosis, no obstructive stone, likely recently passed stone.  Patient was treated with antibiotics for complicated UTI.  12/02:  in ED, she is mildly confused, lethargic, with slow responsiveness.  Patient moves all extremities normally.  No facial droop or slurred speech.  She is orientated x 3. (+) pancytopenia with positive Flu A, WBC 0.5, hemoglobin 8.9, platelet 53 (recent baseline WBC 0.7, hemoglobin 7.0, platelet 58 on 04/02/2022), UA(+) blood but no UTI. Cdiff and GI panel neg. COVID PCR negative, GFR> 60, potassium 2.5, magnesium 2.0.  Temperature up to 102.7, blood pressure 130/51, heart rate 93, RR 21, SpO2 94% on room air.  Chest x-ray mild interstitial pulmonary edema.  CT head negative.  Patient is admitted to telemetry bed as inpatient. Message sent to Dr. Rogue Bussing of oncology for consult. 12/03: febrile to 102.6 early this AM. Remains pancytopenic, drop Hgb likely hemodilutional. Lactic acid trended down. Still significantly hypokalemic. Has been on room air and comfortable.  12/04: still stable on room air, hypokalemic. Anemia worse, 1 unit irradiated PRBC ordered. Dr [name] [facility]) saw patient,   Patient was scheduled to start treatment with Vidaza and venetoclax this week.  Cancel treatment this week and will rediscuss options on Monday 12/11. Neutropenia and thrombocytopenia are slightly worse likely secondary to progressive  disease and influenza.  12/05: VSS, temp slightly up at 99.39F. Hgb to 9.4 this AM. BMP shows K improving to 3.3. One BCx (+) Gram Pos Cocci --> staph epidermidis, other BCx NGx3d, positive culture likely contaminant. UCx NG. D/c abx this am. K still low. Will keep tonight and if K improved and not worse off, anticipate possible discharge tomorrow.    Consultants:  Oncology   Procedures: None       ASSESSMENT & PLAN:   Principal Problem:   Influenza A Active Problems:   Severe sepsis (HCC)   Neutropenia with fever (HCC)   Acute metabolic encephalopathy   Diarrhea   Pancytopenia (HCC)   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HCC)   Hypokalemia   Hypophosphatemia   Chronic diastolic CHF (congestive heart failure) (HCC)   Hypothyroidism   Depression with anxiety  Severe sepsis due to Influenza A:  Patient has positive flu A PCR.   RULED OUT: UTI, bacteremia, CDiff and other GI infection meets criteria for severe sepsis on admission with WBC 0.5, heart rate 93, RR 21, fever 102.7.  Lactic acid 2.3, 2.0. Tamiflu 75 mg twice daily prn tylenol for fever   Pancytopenia Hgb dropped today to 6.8 1 unit PRBC ordered HemOnc to see - Neutropenia and thrombocytopenia are slightly worse likely secondary to progressive disease and influenza.   Neutropenia with fever ([place]):  pt has Flu A, which is likely the source of her fever Cannot completely rule out bacterial infection due to neutropenia. Blood culture NG x2d Urine culture NG GI PCR neg  Started vancomycin and cefepime 04/04/2022 --> if culture negative, may consider d/c both unless concerns from HemOnc --> BCx contaminant and negative, stopped abx, pt received last dose early AM on  04/07/22  Neutropenia precaution HemOnc consult - Neutropenia slightly worse likely secondary to progressive disease and influenza.     Acute metabolic encephalopathy - improved:  Likely multifactorial etiology, including flu A, electrolytes  disturbance and severe sepsis.   CT head negative. Monitor    Diarrhea:  This is chronic issue. negative C diff and GI path panel   Pancytopenia ([place]):  Hemoglobin is better than recent baseline. Follow-up with CBC HemOnc consult    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[place]):  pt is following up with Dr. [name] of oncology Recent bone marrow biopsy revealed mildly progressive disease with her total blast count of approximately 15%.  Up from 5% in March. Patient was scheduled to start treatment with Vidaza and venetoclax this week.  Cancel treatment this week and will rediscuss options on Monday December to 11, 2023.    Hypokalemia and phosphatemia:  Repleted potassium and phosphorus Follow labs  Pharmacy consulted to assist given very minimal response to supplementation Urine K/Cr ordered    Chronic diastolic CHF (congestive heart failure) ([place]):  2D echo on 09/05/2020 showed EF acetic acid 5% with grade 1 diastolic dysfunction.  BNP 369, chest x-ray showed mild interstitial edema, but no leg edema or JVD.  No shortness of breath.  Does not seem to have acute CHF exacerbation Watch volume status closely Did not give diuretics intiially due to sepsis   Hypothyroidism Synthroid   Depression with anxiety Continue home medications        DVT prophylaxis: SCD Pertinent IV fluids/nutrition: no continuous IV fluids  Central lines / invasive devices: none  Code Status: FULL CODE   Disposition: inpatient  TOC needs: none at this time  Barriers to discharge / significant pending items: hypokalemia

## 2022-04-06 ENCOUNTER — Encounter (HOSPITAL_COMMUNITY): Payer: Self-pay | Admitting: Oncology

## 2022-04-06 ENCOUNTER — Inpatient Hospital Stay: Payer: PPO

## 2022-04-06 ENCOUNTER — Inpatient Hospital Stay: Payer: PPO | Admitting: Oncology

## 2022-04-06 ENCOUNTER — Other Ambulatory Visit (HOSPITAL_COMMUNITY): Payer: Self-pay

## 2022-04-06 ENCOUNTER — Other Ambulatory Visit: Payer: Self-pay | Admitting: Oncology

## 2022-04-06 DIAGNOSIS — J101 Influenza due to other identified influenza virus with other respiratory manifestations: Secondary | ICD-10-CM

## 2022-04-06 LAB — CULTURE, BLOOD (ROUTINE X 2): Culture: NO GROWTH

## 2022-04-06 LAB — CBC
HCT: 20.1 % — ABNORMAL LOW (ref 36.0–46.0)
Hemoglobin: 6.8 g/dL — ABNORMAL LOW (ref 12.0–15.0)
MCH: 29.8 pg (ref 26.0–34.0)
MCHC: 33.8 g/dL (ref 30.0–36.0)
MCV: 88.2 fL (ref 80.0–100.0)
Platelets: 36 10*3/uL — ABNORMAL LOW (ref 150–400)
RBC: 2.28 MIL/uL — ABNORMAL LOW (ref 3.87–5.11)
RDW: 16.1 % — ABNORMAL HIGH (ref 11.5–15.5)
WBC: 0.3 10*3/uL — CL (ref 4.0–10.5)
nRBC: 0 % (ref 0.0–0.2)

## 2022-04-06 LAB — URINE CULTURE: Culture: NO GROWTH

## 2022-04-06 LAB — NA AND K (SODIUM & POTASSIUM), RAND UR
Potassium Urine: 12 mmol/L
Sodium, Ur: 32 mmol/L

## 2022-04-06 LAB — BASIC METABOLIC PANEL
Anion gap: 6 (ref 5–15)
BUN: 7 mg/dL — ABNORMAL LOW (ref 8–23)
CO2: 19 mmol/L — ABNORMAL LOW (ref 22–32)
Calcium: 7.7 mg/dL — ABNORMAL LOW (ref 8.9–10.3)
Chloride: 112 mmol/L — ABNORMAL HIGH (ref 98–111)
Creatinine, Ser: 0.73 mg/dL (ref 0.44–1.00)
GFR, Estimated: >60 mL/min (ref >60)
Glucose, Bld: 96 mg/dL (ref 70–99)
Potassium: 2.6 mmol/L — CL (ref 3.5–5.1)
Sodium: 137 mmol/L (ref 135–145)

## 2022-04-06 LAB — PREPARE RBC (CROSSMATCH)

## 2022-04-06 LAB — CREATININE, URINE, RANDOM: Creatinine, Urine: 19 mg/dL

## 2022-04-06 LAB — POTASSIUM: Potassium: 2.9 mmol/L — ABNORMAL LOW (ref 3.5–5.1)

## 2022-04-06 LAB — MAGNESIUM: Magnesium: 1.6 mg/dL — ABNORMAL LOW (ref 1.7–2.4)

## 2022-04-06 MED ORDER — POTASSIUM CHLORIDE CRYS ER 20 MEQ PO TBCR: 40.0000 meq | EXTENDED_RELEASE_TABLET | Freq: Four times a day (QID) | ORAL | Status: DC

## 2022-04-06 MED ORDER — SODIUM CHLORIDE 0.9% IV SOLUTION: Freq: Once | INTRAVENOUS | Status: AC

## 2022-04-06 MED ORDER — POTASSIUM CHLORIDE CRYS ER 20 MEQ PO TBCR
40.0000 meq | EXTENDED_RELEASE_TABLET | ORAL | Status: AC
  Administered 2022-04-06 (×2): 40 meq via ORAL
  Filled 2022-04-06 (×2): qty 2

## 2022-04-06 MED ORDER — MAGNESIUM SULFATE 4 GM/100ML IV SOLN
4.0000 g | Freq: Once | INTRAVENOUS | Status: AC
  Administered 2022-04-06: 4 g via INTRAVENOUS
  Filled 2022-04-06: qty 100

## 2022-04-06 MED ORDER — POTASSIUM CHLORIDE CRYS ER 20 MEQ PO TBCR
40.0000 meq | EXTENDED_RELEASE_TABLET | ORAL | Status: AC
  Administered 2022-04-06 – 2022-04-07 (×3): 40 meq via ORAL
  Filled 2022-04-06 (×3): qty 2

## 2022-04-06 NOTE — Progress Notes (Signed)
       CROSS COVER NOTE  NAME: Debra Hale MRN: [id number] DOB : 08-24-1950 ATTENDING PHYSICIAN: Ivor Costa, MD    Date of Service   04/06/2022   HPI/Events of Note   Notified of Critical K -->2.6. M(r)s Debra Hale received 60 mEQ of IV K yesterday for K of 2.7.  Interventions   Assessment/Plan:  40 mEQ PO K x2 Recheck K at 1400      This document was prepared using Dragon voice recognition software and may include unintentional dictation errors.  Neomia Glass DNP, MBA, FNP-BC Nurse Practitioner [facility] Pager [phone]

## 2022-04-06 NOTE — Consult Note (Signed)
[facility]  [phone] [phone]) [phone]  ID: Debra Hale OB: 06-Jan-1951  MR#: [id number]  XBL#:[id number]  Patient Care Team: [name], MD as PCP - General (Internal Medicine) Wellington Hampshire, MD as PCP - Cardiology (Cardiology) Lloyd Huger, MD as Consulting Physician (Oncology)  CHIEF COMPLAINT: AML, pancytopenia, Influenza.  INTERVAL HISTORY: Patient is a 71 year old female actively receiving treatment for her acute leukemia who was recently admitted to the hospital with what was presumed to be a passed kidney stone.  She was readmitted and found to be positive for influenza A.  Currently, she feels "horrible", but is afebrile.  She has significant weakness and fatigue.  She has no neurologic complaints.  She has a poor appetite, but denies weight loss.  She has no chest pain, shortness of breath, cough, or hemoptysis.  She denies any nausea, vomiting, constipation, or diarrhea.  She has no urinary complaints.  Patient feels generally terrible, but offers no further specific complaints today.  REVIEW OF SYSTEMS:   Review of Systems  Constitutional:  Positive for fever and malaise/fatigue. Negative for weight loss.  Respiratory: Negative.  Negative for cough, hemoptysis and shortness of breath.   Cardiovascular: Negative.  Negative for chest pain and leg swelling.  Gastrointestinal: Negative.  Negative for abdominal pain and nausea.  Genitourinary: Negative.  Negative for dysuria.  Musculoskeletal: Negative.   Skin:  Negative for rash.  Neurological:  Positive for weakness. Negative for dizziness, focal weakness and headaches.  Psychiatric/Behavioral: Negative.  The patient is not nervous/anxious.     As per HPI. Otherwise, a complete review of systems is negative.  PAST MEDICAL HISTORY: Past Medical History:  Diagnosis Date   Abdominal pain 03/06/2016   Acute pericarditis 05/31/2013   Anxiety    Arthritis    Osteoarthritis    Breast cancer ([place]) 2009   right breast lumpectomy with rad tx   Colon cancer ([place])    surgery with chemo and rad tx   Complication of anesthesia    GERD (gastroesophageal reflux disease)    Heart palpitations    History of hiatal hernia    History of kidney stones    History of thyroid cancer 09/18/2008   Qualifier: Diagnosis of   By: Orville Govern, CMA, Carol       Hypothyroidism    Liver disease    Liver nodule    s/p negative biopsy   Malignant neoplasm of thyroid gland ([place]) 2002   s/p surgery and XRT   Osteoporosis    Other and unspecified hyperlipidemia    Palpitations    Personal history of chemotherapy    Personal history of malignant neoplasm of large intestine    carcinoma - cecum, s/p right laparoscopic colectomy - s/p chemotherapy and XRT   Personal history of radiation therapy    Pneumonia 2019   PONV (postoperative nausea and vomiting)    Pure hypercholesterolemia    Unspecified hereditary and idiopathic peripheral neuropathy     PAST SURGICAL HISTORY: Past Surgical History:  Procedure Laterality Date   APPENDECTOMY  1985   BREAST BIOPSY Right 2009   positive   BREAST BIOPSY Right 2009   negative   BREAST LUMPECTOMY Right 2009   breast cancer   CHOLECYSTECTOMY  1995   COLONOSCOPY     COLONOSCOPY WITH PROPOFOL N/A 10/29/2017   Procedure: COLONOSCOPY WITH PROPOFOL;  Surgeon: Manya Silvas, MD;  Location: [facility] ENDOSCOPY;  Service: Endoscopy;  Laterality: N/A;   DILATION AND CURETTAGE OF  UTERUS  1990   DILATION AND CURETTAGE, DIAGNOSTIC / THERAPEUTIC  1990   ESOPHAGOGASTRODUODENOSCOPY     ESOPHAGOGASTRODUODENOSCOPY (EGD) WITH PROPOFOL N/A 10/29/2017   Procedure: ESOPHAGOGASTRODUODENOSCOPY (EGD) WITH PROPOFOL;  Surgeon: Manya Silvas, MD;  Location: [facility] ENDOSCOPY;  Service: Endoscopy;  Laterality: N/A;   INCISION AND DRAINAGE PERIRECTAL ABSCESS N/A 06/17/2020   Procedure: IRRIGATION AND DEBRIDEMENT PERIRECTAL ABSCESS;  Surgeon: Jules Husbands, MD;   Location: [facility] ORS;  Service: General;  Laterality: N/A;   IR BONE MARROW BIOPSY & ASPIRATION  07/09/2021   LAPAROSCOPIC PARTIAL COLECTOMY     stage 3-C carcinoma of the cecum, s/p chemotherapy and xrt   LITHOTRIPSY     SIGMOIDOSCOPY  08/26/1993   THYROID LOBECTOMY  2002   s/p XRT   TOTAL HIP ARTHROPLASTY Right 10/24/2019   Procedure: TOTAL HIP ARTHROPLASTY;  Surgeon: Corky Mull, MD;  Location: [facility] ORS;  Service: Orthopedics;  Laterality: Right;    FAMILY HISTORY: Family History  Problem Relation Age of Onset   Stroke Mother        51s   Alzheimer's disease Mother    Lung cancer Father    Prostate cancer Father    Cancer Father        Colon   Colon cancer Father    Breast cancer Sister        44's   Lung cancer Sister    Breast cancer Maternal Aunt     ADVANCED DIRECTIVES (Y/N):  _0 @  HEALTH MAINTENANCE: Social History   Tobacco Use   Smoking status: Never   Smokeless tobacco: Never  Vaping Use   Vaping Use: Never used  Substance Use Topics   Alcohol use: No    Alcohol/week: 0.0 standard drinks of alcohol   Drug use: No     Colonoscopy:  PAP:  Bone density:  Lipid panel:  Allergies  Allergen Reactions   Demeclocycline Other (See Comments)    Throat swells   Sulfa Antibiotics Other (See Comments)    Other reaction(s): Other (See Comments) Throat swells Other reaction(s): Other (See Comments) Throat swells Throat swells Other reaction(s): Other (See Comments) Throat swells Other reaction(s): Unknown Other reaction(s): Other (See Comments) Throat swells Other reaction(s): Other (See Comments) Other reaction(s): Other (See Comments) Throat swellsOther reaction(s): Other (See Comments) Throat swells   Tetracyclines & Related Other (See Comments)    Throat swells    Levofloxacin Diarrhea    Severe diarrhea   Augmentin [Amoxicillin-Pot Clavulanate] Diarrhea   Bentyl [Dicyclomine Hcl]     unkn   Ciprofloxacin Diarrhea   Codeine Other (See  Comments)    dizziness     Dicyclomine Hcl Other (See Comments)    unkn   Epinephrine     Speeds heart up - panic    Flagyl [Metronidazole] Nausea And Vomiting   Librax [Chlordiazepoxide-Clidinium]     unkn   Novocain [Procaine] Other (See Comments)    "Shaky"   Phenobarbital     unkn   Prednisone     Nervous     Ultram [Tramadol] Other (See Comments)    Sick feeling    Current Facility-Administered Medications  Medication Dose Route Frequency Provider Last Rate Last Admin   0.9 %  sodium chloride infusion (Manually program via Guardrails IV Fluids)   Intravenous Once Emeterio Reeve, DO       acetaminophen (TYLENOL) tablet 650 mg  650 mg Oral Q6H PRN Ivor Costa, MD   650 mg at 04/06/22 [phone]  albuterol (PROVENTIL) (2.5 MG/3ML) 0.083% nebulizer solution 2.5 mg  2.5 mg Nebulization Q4H PRN Alison Murray, RPH       ceFEPIme (MAXIPIME) 2 g in sodium chloride 0.9 % 100 mL IVPB  2 g Intravenous Q12H Darrick Penna, RPH 200 mL/hr at 04/06/22 0442 2 g at 04/06/22 0442   clonazePAM (KLONOPIN) tablet 0.5 mg  0.5 mg Oral TID PRN Ivor Costa, MD   0.5 mg at 04/06/22 6720   dextromethorphan-guaiFENesin ([place] DM) 30-600 MG per 12 hr tablet 1 tablet  1 tablet Oral BID PRN Ivor Costa, MD       diphenoxylate-atropine (LOMOTIL) 2.5-0.025 MG per tablet 1 tablet  1 tablet Oral QID PRN Ivor Costa, MD       escitalopram (LEXAPRO) tablet 20 mg  20 mg Oral Jaye Beagle, Soledad Gerlach, MD   20 mg at 04/06/22 0601   levothyroxine (SYNTHROID) tablet 125 mcg  125 mcg Oral Q0600 Ivor Costa, MD   125 mcg at 04/06/22 0601   ondansetron (ZOFRAN) injection 4 mg  4 mg Intravenous Q8H PRN Ivor Costa, MD   4 mg at 04/05/22 0133   oseltamivir (TAMIFLU) capsule 75 mg  75 mg Oral BID Ivor Costa, MD   75 mg at 04/06/22 1019   pantoprazole (PROTONIX) EC tablet 40 mg  40 mg Oral Daily Ivor Costa, MD   40 mg at 04/06/22 1019    OBJECTIVE: Vitals:   04/06/22 0826 04/06/22 1224  BP: (!) 115/57 124/63  Pulse: 79 76   Resp: 17 18  Temp: 98.1 F (36.7 C) 98.7 F (37.1 C)  SpO2: 94% 95%     Body mass index is 23.08 kg/m.    ECOG FS:1 - Symptomatic but completely ambulatory  General: Well-developed, well-nourished, no acute distress. Eyes: Pink conjunctiva, anicteric sclera. HEENT: Normocephalic, moist mucous membranes. Lungs: No audible wheezing or coughing. Heart: Regular rate and rhythm. Abdomen: Soft, nontender, no obvious distention. Musculoskeletal: No edema, cyanosis, or clubbing. Neuro: Alert, answering all questions appropriately. Cranial nerves grossly intact. Skin: No rashes or petechiae noted. Psych: Normal affect. Lymphatics: No cervical, calvicular, axillary or inguinal LAD.   LAB RESULTS:  Lab Results  Component Value Date   NA 137 04/06/2022   K 2.6 (LL) 04/06/2022   CL 112 (H) 04/06/2022   CO2 19 (L) 04/06/2022   GLUCOSE 96 04/06/2022   BUN 7 (L) 04/06/2022   CREATININE 0.73 04/06/2022   CALCIUM 7.7 (L) 04/06/2022   PROT 5.3 (L) 04/02/2022   ALBUMIN 3.1 (L) 04/02/2022   AST 9 (L) 04/02/2022   ALT 17 04/02/2022   ALKPHOS 68 04/02/2022   BILITOT 0.8 04/02/2022   GFRNONAA >60 04/06/2022   GFRAA 54 (L) 10/26/2019    Lab Results  Component Value Date   WBC 0.3 (LL) 04/06/2022   NEUTROABS 0.3 (LL) 04/04/2022   HGB 6.8 (L) 04/06/2022   HCT 20.1 (L) 04/06/2022   MCV 88.2 04/06/2022   PLT 36 (L) 04/06/2022     STUDIES: DG Chest Portable 1 View  Result Date: 04/04/2022 CLINICAL DATA:  Confusion and altered mental status this morning. EXAM: PORTABLE CHEST 1 VIEW COMPARISON:  November 15, 2020 FINDINGS: The mediastinal contour is normal. The heart size is mildly enlarged. Increased central pulmonary vessel caliber noted. There is no focal pneumonia, pulmonary edema or pleural effusion. The visualized skeletal structures are stable. IMPRESSION: Mild congestive heart failure. Electronically Signed   By: Abelardo Diesel M.D.   On: 04/04/2022 12:03   CT  HEAD WO CONTRAST  (5MM)  Result Date: 04/04/2022 CLINICAL DATA:  Mental status change with unknown cause EXAM: CT HEAD WITHOUT CONTRAST TECHNIQUE: Contiguous axial images were obtained from the base of the skull through the vertex without intravenous contrast. RADIATION DOSE REDUCTION: This exam was performed according to the departmental dose-optimization program which includes automated exposure control, adjustment of the mA and/or kV according to patient size and/or use of iterative reconstruction technique. COMPARISON:  04/29/2021 FINDINGS: Brain: No evidence of acute infarction, hemorrhage, hydrocephalus, extra-axial collection or mass lesion/mass effect. Vascular: No hyperdense vessel or unexpected calcification. Skull: Normal. Negative for fracture or focal lesion. Sinuses/Orbits: Extensive opacification of the right maxillary sinus, chronic when compared to prior and with sclerotic wall thickening. Nonspecific calcification lateral to the left pterygoid body, stable and non worrisome. IMPRESSION: 1. No acute or interval finding. 2. Chronic, focal right maxillary sinusitis. Electronically Signed   By: Jorje Guild M.D.   On: 04/04/2022 11:11   CT Renal Stone Study  Result Date: 03/31/2022 CLINICAL DATA:  Abdominal/flank pain, stone suspected EXAM: CT ABDOMEN AND PELVIS WITHOUT CONTRAST TECHNIQUE: Multidetector CT imaging of the abdomen and pelvis was performed following the standard protocol without IV contrast. RADIATION DOSE REDUCTION: This exam was performed according to the departmental dose-optimization program which includes automated exposure control, adjustment of the mA and/or kV according to patient size and/or use of iterative reconstruction technique. COMPARISON:  06/16/2020 FINDINGS: Lower chest: No pleural or pericardial effusion. Hepatobiliary: 2.3 cm low-attenuation lesion in hepatic segment 2 (Im18,Se4), in retrospect possibly present as a region of of a enhancement 1.7 cm on 07/21/2017 suggesting  hemangioma although nonspecific. No other liver lesions. Cholecystectomy clips. Pancreas: Unremarkable. No pancreatic ductal dilatation or surrounding inflammatory changes. Spleen: Normal in size without focal abnormality. Adrenals/Urinary Tract: No adrenal mass. Bilateral urolithiasis, largest stone in the left upper pole 8 mm, on the right 6 mm. There is new right hydronephrosis and ureterectasis down to the level of the ureteral orifice without visible obstructing Calculus.urinary bladder is physiologically distended. Stomach/Bowel: Small hiatal hernia. Stomach incompletely distended. Small bowel decompressed. Changes of right hemicolectomy with staple line in the mid abdomen. Remaining left colon unremarkable. Vascular/Lymphatic: Mild scattered aortoiliac calcified plaque. No abdominal or pelvic adenopathy. Reproductive: Uterus and bilateral adnexa are unremarkable. Other: Right pelvic phleboliths.  No ascites.  No free air. Musculoskeletal: Right hip arthroplasty projects in expected location, incompletely visualized. Mild left hip DJD. No acute findings. IMPRESSION: 1. New right hydronephrosis and ureterectasis down to the level of the ureteral orifice without visible obstructing Calculus. 2. Bilateral nephrolithiasis. 3. 2.3 cm low-attenuation lesion in hepatic segment 2, possibly hemangioma but nonspecific. Consider elective outpatient MR liver protocol with contrast for further characterization. 4. Small hiatal hernia. Aortic Atherosclerosis (ICD10-I70.0). Electronically Signed   By: Lucrezia Europe M.D.   On: 03/31/2022 17:19   CT BONE MARROW BIOPSY & ASPIRATION  Result Date: 03/23/2022 INDICATION: 71 year old female with history of AML. EXAM: CT-GUIDED BONE MARROW BIOPSY AND ASPIRATION MEDICATIONS: None ANESTHESIA/SEDATION: Fentanyl 100 mcg IV; Versed 2 mg IV Sedation Time: 10 minutes; The patient was continuously monitored during the procedure by the interventional radiology nurse under my direct  supervision. COMPLICATIONS: None immediate. PROCEDURE: Informed consent was obtained from the patient following an explanation of the procedure, risks, benefits and alternatives. The patient understands, agrees and consents for the procedure. All questions were addressed. A time out was performed prior to the initiation of the procedure. The patient was positioned prone and non-contrast localization  CT was performed of the pelvis to demonstrate the iliac marrow spaces. The operative site was prepped and draped in the usual sterile fashion. Under sterile conditions and local anesthesia, a 22 gauge spinal needle was utilized for procedural planning. Next, an 11 gauge coaxial bone biopsy needle was advanced into the right iliac marrow space. Needle position was confirmed with CT imaging. Initially, a bone marrow aspiration was performed. Next, a bone marrow biopsy was obtained with the 11 gauge outer bone marrow device. Samples were prepared with the cytotechnologist and deemed adequate. The needle was removed and superficial hemostasis was obtained with manual compression. A dressing was applied. The patient tolerated the procedure well without immediate post procedural complication. IMPRESSION: Successful CT guided right iliac bone marrow aspiration and core biopsy. Ruthann Cancer, MD [facility] Electronically Signed   By: Ruthann Cancer M.D.   On: 03/23/2022 09:50    ASSESSMENT: AML, pancytopenia, Influenza.   PLAN:    AML: Recent bone marrow biopsy revealed mildly progressive disease with her total blast count of approximately 15%.  Her previous bone marrow biopsy in March 2023 revealed only approximately 5% blasts.  Patient was scheduled to start treatment with Vidaza and venetoclax this week.  Cancel treatment this week and will rediscuss options on Monday December to 11, 2023. Pancytopenia: Patient has a baseline pancytopenia from her disease and  from her treatments.  Neutropenia and thrombocytopenia are slightly worse likely secondary to progressive disease and influenza.  Hemoglobin appears to be relatively stable.  No intervention needed.  All blood products need to be irradiated. Influenza: Patient currently on Tamiflu. Hypokalemia/hypomagnesia: Replace electrolytes as indicated. Hyperbilirubinemia: Mild, monitor.  Appreciate consult, will follow.   Lloyd Huger, MD   04/06/2022 2:29 PM

## 2022-04-06 NOTE — Consult Note (Signed)
PHARMACY CONSULT NOTE - FOLLOW UP  Pharmacy Consult for Electrolyte Monitoring and Replacement   Recent Labs: Potassium (mmol/L)  Date Value  04/06/2022 2.6 (LL)  11/30/2013 3.5   Magnesium (mg/dL)  Date Value  04/04/2022 1.7   Calcium (mg/dL)  Date Value  04/06/2022 7.7 (L)   Calcium, Total (mg/dL)  Date Value  11/30/2013 8.7   Albumin (g/dL)  Date Value  04/02/2022 3.1 (L)  11/30/2013 3.3 (L)   Phosphorus (mg/dL)  Date Value  04/05/2022 3.1   Sodium (mmol/L)  Date Value  04/06/2022 137  05/09/2018 143  11/30/2013 145     Assessment: 71 yo female presneted with AMS, diarrhea, and fever.  Patient tested positive for influenza A.  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  Patient was also found to be hypokalemic, which has not resolved after multiple replacement orders.  12/2: 80 meq po KCL 22 Meq of K from IV Kphos 10 meq IV Kcl  12/3: 60 meq Kcl IV   Goal of Therapy:  WNL  Plan:  Patient was ordered Kcl 40 meq po x 2 today by provider. Recheck K and Mg 12/4 @ [place] ,PharmD Clinical Pharmacist 04/06/2022 7:39 AM

## 2022-04-06 NOTE — Progress Notes (Signed)
Consent for blood administration obtained/ signed and placed on chart

## 2022-04-06 NOTE — Progress Notes (Signed)
PROGRESS NOTE    Debra Hale   POE:[id number] DOB: 1950-08-17  DOA: 04/04/2022 Date of Service: 04/06/22 PCP: [name], MD     Brief Narrative / Hospital Course:  Debra Hale is a 71 y.o. 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  She was recently hospitalized from 11/28 - 11/30 due to right flank pain with hydronephrosis, no obstructive stone, likely recently passed stone.  Patient was treated with antibiotics for complicated UTI.  12/02:  in ED, she is mildly confused, lethargic, with slow responsiveness.  Patient moves all extremities normally.  No facial droop or slurred speech.  She is orientated x 3. (+) pancytopenia with positive Flu A, WBC 0.5, hemoglobin 8.9, platelet 53 (recent baseline WBC 0.7, hemoglobin 7.0, platelet 58 on 04/02/2022), UA(+) blood but no UTI. Cdiff and GI panel neg. COVID PCR negative, GFR> 60, potassium 2.5, magnesium 2.0.  Temperature up to 102.7, blood pressure 130/51, heart rate 93, RR 21, SpO2 94% on room air.  Chest x-ray mild interstitial pulmonary edema.  CT head negative.  Patient is admitted to telemetry bed as inpatient. Message sent to Dr. Rogue Bussing of oncology for consult. 12/03: febrile to 102.6 early this AM. Remains pancytopenic, drop Hgb likely hemodilutional. Lactic acid trended down. Still significantly hypokalemic. Has been on room air and comfortable.  12/04: still stable on room air, hypokalemic. Anemia worse, 1 unit PRBC ordered. Await HemOnc recs    Consultants:  Oncology   Procedures: None       ASSESSMENT &  PLAN:   Principal Problem:   Influenza A Active Problems:   Severe sepsis (HCC)   Neutropenia with fever (HCC)   Acute metabolic encephalopathy   Diarrhea   Pancytopenia (HCC)   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HCC)   Hypokalemia   Hypophosphatemia   Chronic diastolic CHF (congestive heart failure) ([place])   Hypothyroidism   Depression with anxiety  Severe sepsis due to Influenza A:  Patient has positive flu A PCR.   meets criteria for severe sepsis on admission with WBC 0.5, heart rate 93, RR 21, fever 102.7.  Lactic acid 2.3, 2.0. Tamiflu 75 mg twice daily Trend Procalcitonin and trend lactic acid levels per sepsis protocol. IVF: 1L of NS bolus in ED (patient has a congestive heart failure with elevated BNP 369, limiting aggressive IV fluids treatment). prn tylenol for fever   Pancytopenia Hgb dropped today to 6.8 1 unit PRBC ordered HemOnc to see   Neutropenia with fever ([place]):  pt has Flu A, which is likely the source of her fever Cannot completely rule out bacterial infection due to neutropenia. Blood culture NG x2d Urine culture NG GI PCR neg  Started vancomycin and cefepime 04/04/2022 --> if culture negative, may consider d/c both unless concerns from HemOnc Neutropenia precaution HemOnc consult    Acute metabolic encephalopathy - improved:  Likely multifactorial etiology, including flu A, electrolytes disturbance and severe sepsis.   CT head negative. Monitor    Diarrhea:  This is chronic issue. negative C diff and GI path panel   Pancytopenia ([place]):  Hemoglobin is better than recent baseline. Follow-up with CBC HemOnc consult    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[place]):  pt  is following up with Dr. [name] of oncology Message sent to Dr. Rogue Bussing who is on-call for oncology   Hypokalemia and phosphatemia:  Repleted potassium and phosphorus Follow labs  Pharmacy consulted to assist given very minimal response to supplementation Urine K/Cr ordered     Chronic diastolic CHF (congestive heart failure) ([place]):  2D echo on 09/05/2020 showed EF acetic acid 5% with grade 1 diastolic dysfunction.  BNP 369, chest x-ray showed mild interstitial edema, but no leg edema or JVD.  No shortness of breath.  Does not seem to have acute CHF exacerbation Watch volume status closely Will not give diuretics due to severe sepsis   Hypothyroidism Synthroid   Depression with anxiety Continue home medications        DVT prophylaxis: SCD Pertinent IV fluids/nutrition: no continuous IV fluids  Central lines / invasive devices: none  Code Status: FULL CODE   Disposition: inpatient  TOC needs: none at this time  Barriers to discharge / significant pending items: IV abx pending HemOnc recs              Subjective:  Patient reports feeling tired, gets easily fatigued w/ any exertion  Denies CP/SOB.  Pain controlled.  Denies focal weakness.  Tolerating diet.  Reports no concerns w/ urination/defecation.    Objective Findings:  Vitals:   04/06/22 0039 04/06/22 0513 04/06/22 0826 04/06/22 1224  BP: (!) 130/58 (!) 124/56 (!) 115/57 124/63  Pulse: 84 78 79 76  Resp: '16 16 17 18  '$ Temp: 99.6 F (37.6 C) 99.7 F (37.6 C) 98.1 F (36.7 C) 98.7 F (37.1 C)  TempSrc: Oral Oral Oral   SpO2: 91% 92% 94% 95%  Weight:      Height:        Intake/Output Summary (Last 24 hours) at 04/06/2022 1412 Last data filed at 04/06/2022 3810 Gross per 24 hour  Intake 375.84 ml  Output 602 ml  Net -226.16 ml   Filed Weights   04/04/22 1037  Weight: 61 kg    Examination:  Constitutional:  VS as above General Appearance: alert, well-developed, well-nourished, NAD Respiratory: Normal respiratory effort No wheeze No rhonchi No rales Cardiovascular: S1/S2 normal + murmur RRR Gastrointestinal: No tenderness Musculoskeletal:  No clubbing/cyanosis of digits Symmetrical movement in all extremities Neurological: No cranial nerve deficit on  limited exam Alert Psychiatric: Normal judgment/insight Normal mood and affect       Scheduled Medications:   sodium chloride   Intravenous Once   escitalopram  20 mg Oral BH-q7a   levothyroxine  125 mcg Oral Q0600   oseltamivir  75 mg Oral BID   pantoprazole  40 mg Oral Daily    Continuous Infusions:  ceFEPime (MAXIPIME) IV 2 g (04/06/22 0442)    PRN Medications:  acetaminophen, albuterol, clonazePAM, dextromethorphan-guaiFENesin, diphenoxylate-atropine, ondansetron (ZOFRAN) IV  Antimicrobials:  Anti-infectives (From admission, onward)    Start     Dose/Rate Route Frequency Ordered Stop   04/05/22 1700  vancomycin (VANCOCIN) IVPB 1000 mg/200 mL premix  Status:  Discontinued        1,000 mg 200 mL/hr over 60 Minutes Intravenous Every 24 hours 04/04/22 1604 04/06/22 1237   04/04/22 2200  oseltamivir (TAMIFLU) capsule 75 mg        75 mg Oral 2 times daily 04/04/22 1548 04/09/22 2159   04/04/22 1630  vancomycin (VANCOREADY) IVPB 1250 mg/250 mL        1,250 mg 166.7 mL/hr over 90 Minutes Intravenous  Once 04/04/22 1604 04/04/22 1850  04/04/22 1630  ceFEPIme (MAXIPIME) 2 g in sodium chloride 0.9 % 100 mL IVPB        2 g 200 mL/hr over 30 Minutes Intravenous Every 12 hours 04/04/22 1604             Data Reviewed: I have personally reviewed following labs and imaging studies  CBC: Recent Labs  Lab 03/31/22 1630 04/01/22 0446 04/02/22 0515 04/04/22 1050 04/05/22 0753 04/06/22 0422  WBC 1.0* 0.9* 0.7* 0.5*  0.5* 0.4* 0.3*  NEUTROABS 0.4*  --  0.3* 0.3*  --   --   HGB 8.9* 7.8* 7.0* 8.9*  9.1* 7.8* 6.8*  HCT 25.7* 23.3* 21.2* 26.2*  26.6* 23.3* 20.1*  MCV 88.0 90.0 91.8 88.8  89.3 90.7 88.2  PLT 93* 67* 58* 54*  55* 40* 36*   Basic Metabolic Panel: Recent Labs  Lab 03/31/22 1630 04/01/22 0446 04/02/22 0515 04/04/22 1050 04/04/22 1206 04/05/22 0753 04/05/22 1453 04/06/22 0422  NA  --  137 143 136  --  139  --  137  K  --  3.7 4.0 2.5*  --   2.7* 2.9* 2.6*  CL  --  113* 117* 103  --  113*  --  112*  CO2  --  19* 24 23  --  19*  --  19*  GLUCOSE  --  114* 106* 136*  --  115*  --  96  BUN  --  '16 16 14  '$ --  12  --  7*  CREATININE  --  1.22* 0.78 0.82  --  0.82  --  0.73  CALCIUM  --  7.8* 8.0* 8.2*  --  7.9*  --  7.7*  MG 1.5*  --  2.0  --  1.7  --   --   --   PHOS  --   --  2.9 2.4*  --  3.1  --   --    GFR: Estimated Creatinine Clearance: 54.9 mL/min (by C-G formula based on SCr of 0.73 mg/dL). Liver Function Tests: Recent Labs  Lab 03/31/22 1630 04/01/22 0446 04/02/22 0515  AST 16 13* 9*  ALT '21 20 17  '$ ALKPHOS 78 69 68  BILITOT 1.2 1.3* 0.8  PROT 6.5 5.3* 5.3*  ALBUMIN 4.0 3.2* 3.1*   No results for input(s): "LIPASE", "AMYLASE" in the last 168 hours. No results for input(s): "AMMONIA" in the last 168 hours. Coagulation Profile: Recent Labs  Lab 04/04/22 1500  INR 1.3*   Cardiac Enzymes: No results for input(s): "CKTOTAL", "CKMB", "CKMBINDEX", "TROPONINI" in the last 168 hours. BNP (last 3 results) No results for input(s): "PROBNP" in the last 8760 hours. HbA1C: No results for input(s): "HGBA1C" in the last 72 hours. CBG: No results for input(s): "GLUCAP" in the last 168 hours. Lipid Profile: No results for input(s): "CHOL", "HDL", "LDLCALC", "TRIG", "CHOLHDL", "LDLDIRECT" in the last 72 hours. Thyroid Function Tests: No results for input(s): "TSH", "T4TOTAL", "FREET4", "T3FREE", "THYROIDAB" in the last 72 hours. Anemia Panel: No results for input(s): "VITAMINB12", "FOLATE", "FERRITIN", "TIBC", "IRON", "RETICCTPCT" in the last 72 hours. Most Recent Urinalysis On File:     Component Value Date/Time   COLORURINE YELLOW (A) 04/04/2022 2351   APPEARANCEUR CLEAR (A) 04/04/2022 2351   APPEARANCEUR Hazy 11/30/2013 1821   LABSPEC 1.018 04/04/2022 2351   LABSPEC 1.023 11/30/2013 1821   PHURINE 5.0 04/04/2022 2351   GLUCOSEU NEGATIVE 04/04/2022 2351   GLUCOSEU NEGATIVE 09/11/2014 0810   HGBUR LARGE (A)  04/04/2022 2351  BILIRUBINUR NEGATIVE 04/04/2022 2351   BILIRUBINUR Negative 11/30/2013 1821   KETONESUR 5 (A) 04/04/2022 2351   PROTEINUR 100 (A) 04/04/2022 2351   UROBILINOGEN 0.2 09/11/2014 0810   NITRITE NEGATIVE 04/04/2022 2351   LEUKOCYTESUR NEGATIVE 04/04/2022 2351   LEUKOCYTESUR Negative 11/30/2013 1821   Sepsis Labs: '@LABRCNTIP'$ (procalcitonin:4,lacticidven:4)  Recent Results (from the past 240 hour(s))  Culture, blood (Routine X 2) w Reflex to ID Panel     Status: Abnormal   Collection Time: 04/01/22 10:20 AM   Specimen: BLOOD  Result Value Ref Range Status   Specimen Description   Final    BLOOD RIGHT ANTECUBITAL Performed at [facility], [address] [place], [place] [address]    Special Requests   Final    BOTTLES DRAWN AEROBIC AND ANAEROBIC Blood Culture results may not be optimal due to an excessive volume of blood received in culture bottles Performed at [facility], [address] [place], [place] [address]    Culture  Setup Time   Final    GRAM POSITIVE COCCI ANAEROBIC BOTTLE ONLY CRITICAL RESULT CALLED TO, READ BACK BY AND VERIFIED WITH: Rito Ehrlich PHARMD [id number] 04/02/22 HNM    Culture (A)  Final    STAPHYLOCOCCUS CAPITIS THE SIGNIFICANCE OF ISOLATING THIS ORGANISM FROM A SINGLE SET OF BLOOD CULTURES WHEN MULTIPLE SETS ARE DRAWN IS UNCERTAIN. PLEASE NOTIFY THE MICROBIOLOGY DEPARTMENT WITHIN ONE WEEK IF SPECIATION AND SENSITIVITIES ARE REQUIRED. Performed at [facility], [address], [place], [place] [address]    Report Status 04/04/2022 FINAL  Final  Blood Culture ID Panel (Reflexed)     Status: Abnormal   Collection Time: 04/01/22 10:20 AM  Result Value Ref Range Status   Enterococcus faecalis NOT DETECTED NOT DETECTED Final   Enterococcus Faecium NOT DETECTED NOT DETECTED Final   Listeria monocytogenes NOT DETECTED NOT DETECTED Final   Staphylococcus species DETECTED (A) NOT DETECTED Final    Comment: CRITICAL RESULT  CALLED TO, READ BACK BY AND VERIFIED WITH: Rito Ehrlich PHARMD [id number] 04/02/22 HNM    Staphylococcus aureus (BCID) NOT DETECTED NOT DETECTED Final   Staphylococcus epidermidis NOT DETECTED NOT DETECTED Final   Staphylococcus lugdunensis NOT DETECTED NOT DETECTED Final   Streptococcus species NOT DETECTED NOT DETECTED Final   Streptococcus agalactiae NOT DETECTED NOT DETECTED Final   Streptococcus pneumoniae NOT DETECTED NOT DETECTED Final   Streptococcus pyogenes NOT DETECTED NOT DETECTED Final   A.calcoaceticus-baumannii NOT DETECTED NOT DETECTED Final   Bacteroides fragilis NOT DETECTED NOT DETECTED Final   Enterobacterales NOT DETECTED NOT DETECTED Final   Enterobacter cloacae complex NOT DETECTED NOT DETECTED Final   Escherichia coli NOT DETECTED NOT DETECTED Final   Klebsiella aerogenes NOT DETECTED NOT DETECTED Final   Klebsiella oxytoca NOT DETECTED NOT DETECTED Final   Klebsiella pneumoniae NOT DETECTED NOT DETECTED Final   Proteus species NOT DETECTED NOT DETECTED Final   Salmonella species NOT DETECTED NOT DETECTED Final   Serratia marcescens NOT DETECTED NOT DETECTED Final   Haemophilus influenzae NOT DETECTED NOT DETECTED Final   Neisseria meningitidis NOT DETECTED NOT DETECTED Final   Pseudomonas aeruginosa NOT DETECTED NOT DETECTED Final   Stenotrophomonas maltophilia NOT DETECTED NOT DETECTED Final   Candida albicans NOT DETECTED NOT DETECTED Final   Candida auris NOT DETECTED NOT DETECTED Final   Candida glabrata NOT DETECTED NOT DETECTED Final   Candida krusei NOT DETECTED NOT DETECTED Final   Candida parapsilosis NOT DETECTED NOT DETECTED Final   Candida tropicalis NOT  DETECTED NOT DETECTED Final   Cryptococcus neoformans/gattii NOT DETECTED NOT DETECTED Final    Comment: Performed at [facility], [address] [place], [place] [address]  Culture, blood (Routine X 2) w Reflex to ID Panel     Status: None   Collection Time: 04/01/22 10:25 AM   Specimen:  BLOOD  Result Value Ref Range Status   Specimen Description BLOOD BLOOD LEFT ARM  Final   Special Requests   Final    BOTTLES DRAWN AEROBIC AND ANAEROBIC Blood Culture results may not be optimal due to an excessive volume of blood received in culture bottles   Culture   Final    NO GROWTH 5 DAYS Performed at [facility], [address] [place], [place] [address]    Report Status 04/06/2022 FINAL  Final  Urine Culture     Status: Abnormal   Collection Time: 04/01/22 10:50 AM   Specimen: Urine, Random  Result Value Ref Range Status   Specimen Description   Final    URINE, RANDOM Performed at [facility], [address] [place], [place] [address]    Special Requests   Final    NONE Performed at [facility], [address] [place], [place] [address]    Culture MULTIPLE SPECIES PRESENT, SUGGEST RECOLLECTION (A)  Final   Report Status 04/02/2022 FINAL  Final  Resp Panel by RT-PCR (Flu A&B, Covid) Anterior Nasal Swab     Status: Abnormal   Collection Time: 04/04/22 12:07 PM   Specimen: Anterior Nasal Swab  Result Value Ref Range Status   SARS Coronavirus 2 by RT PCR NEGATIVE NEGATIVE Final    Comment: (NOTE) SARS-CoV-2 target nucleic acids are NOT DETECTED.  The SARS-CoV-2 RNA is generally detectable in upper respiratory specimens during the acute phase of infection. The lowest concentration of SARS-CoV-2 viral copies this assay can detect is 138 copies/mL. A negative result does not preclude SARS-Cov-2 infection and should not be used as the sole basis for treatment or other patient management decisions. A negative result may occur with  improper specimen collection/handling, submission of specimen other than nasopharyngeal swab, presence of viral mutation(s) within the areas targeted by this assay, and inadequate number of viral copies(<138 copies/mL). A negative result must be combined with clinical observations, patient history, and  epidemiological information. The expected result is Negative.  Fact Sheet for Patients:  EntrepreneurPulse.com.au  Fact Sheet for Healthcare Providers:  IncredibleEmployment.be  This test is no t yet approved or cleared by the [place] FDA and  has been authorized for detection and/or diagnosis of SARS-CoV-2 by FDA under an Emergency Use Authorization (EUA). This EUA will remain  in effect (meaning this test can be used) for the duration of the COVID-19 declaration under Section 564(b)(1) of the Act, 21 U.S.C.section 360bbb-3(b)(1), unless the authorization is terminated  or revoked sooner.       Influenza A by PCR POSITIVE (A) NEGATIVE Final   Influenza B by PCR NEGATIVE NEGATIVE Final    Comment: (NOTE) The Xpert Xpress SARS-CoV-2/FLU/RSV plus assay is intended as an aid in the diagnosis of influenza from Nasopharyngeal swab specimens and should not be used as a sole basis for treatment. Nasal washings and aspirates are unacceptable for Xpert Xpress SARS-CoV-2/FLU/RSV testing.  Fact Sheet for Patients: EntrepreneurPulse.com.au  Fact Sheet for Healthcare Providers: IncredibleEmployment.be  This test is not yet approved or cleared by the [place] FDA and has been authorized for detection and/or diagnosis of SARS-CoV-2 by  FDA under an Emergency Use Authorization (EUA). This EUA will remain in effect (meaning this test can be used) for the duration of the COVID-19 declaration under Section 564(b)(1) of the Act, 21 U.S.C. section 360bbb-3(b)(1), unless the authorization is terminated or revoked.  Performed at [facility], [address] [place], [place] [address]   Culture, blood (x 2)     Status: None (Preliminary result)   Collection Time: 04/04/22  3:01 PM   Specimen: BLOOD  Result Value Ref Range Status   Specimen Description BLOOD RIGHT HAND  Final   Special Requests   Final     BOTTLES DRAWN AEROBIC AND ANAEROBIC Blood Culture adequate volume   Culture   Final    NO GROWTH 2 DAYS Performed at [facility], [address] [place], [place] [address]    Report Status PENDING  Incomplete  Culture, blood (x 2)     Status: None (Preliminary result)   Collection Time: 04/04/22  3:01 PM   Specimen: BLOOD  Result Value Ref Range Status   Specimen Description BLOOD LEFT ARM  Final   Special Requests   Final    BOTTLES DRAWN AEROBIC AND ANAEROBIC Blood Culture adequate volume   Culture   Final    NO GROWTH 2 DAYS Performed at [facility], [address] [place], [place] [address]    Report Status PENDING  Incomplete  Urine Culture     Status: None   Collection Time: 04/04/22 11:51 PM   Specimen: Urine, Clean Catch  Result Value Ref Range Status   Specimen Description   Final    URINE, CLEAN CATCH Performed at [facility], [address] [place], [place] [address]    Special Requests   Final    NONE Performed at [facility], [address] [place], [place] [address]    Culture   Final    NO GROWTH Performed at [facility], [place] [address] [place], [place] [address]    Report Status 04/06/2022 FINAL  Final  C Difficile Quick Screen w PCR reflex     Status: None   Collection Time: 04/04/22 11:59 PM   Specimen: Urine, Clean Catch; Stool  Result Value Ref Range Status   C Diff antigen NEGATIVE NEGATIVE Final   C Diff toxin NEGATIVE NEGATIVE Final   C Diff interpretation No C. difficile detected.  Final    Comment: Performed at [facility], [address] [place],  [address]  Gastrointestinal Panel by PCR , Stool     Status: None   Collection Time: 04/04/22 11:59 PM   Specimen: Urine, Clean Catch; Stool  Result Value Ref Range Status   Campylobacter species NOT DETECTED NOT DETECTED Final   Plesimonas shigelloides NOT DETECTED NOT DETECTED Final   Salmonella species NOT DETECTED NOT  DETECTED Final   Yersinia enterocolitica NOT DETECTED NOT DETECTED Final   Vibrio species NOT DETECTED NOT DETECTED Final   Vibrio cholerae NOT DETECTED NOT DETECTED Final   Enteroaggregative E coli (EAEC) NOT DETECTED NOT DETECTED Final   Enteropathogenic E coli (EPEC) NOT DETECTED NOT DETECTED Final   Enterotoxigenic E coli (ETEC) NOT DETECTED NOT DETECTED Final   Shiga like toxin producing E coli (STEC) NOT DETECTED NOT DETECTED Final   Shigella/Enteroinvasive E coli (EIEC) NOT DETECTED NOT DETECTED Final   Cryptosporidium NOT DETECTED NOT DETECTED Final   Cyclospora cayetanensis NOT DETECTED NOT DETECTED Final   Entamoeba histolytica NOT DETECTED NOT  DETECTED Final   Giardia lamblia NOT DETECTED NOT DETECTED Final   Adenovirus F40/41 NOT DETECTED NOT DETECTED Final   Astrovirus NOT DETECTED NOT DETECTED Final   Norovirus GI/GII NOT DETECTED NOT DETECTED Final   Rotavirus A NOT DETECTED NOT DETECTED Final   Sapovirus (I, II, IV, and V) NOT DETECTED NOT DETECTED Final    Comment: Performed at [facility], [address] [place], [place] [address]         Radiology Studies: DG Chest Portable 1 View  Result Date: 04/04/2022 CLINICAL DATA:  Confusion and altered mental status this morning. EXAM: PORTABLE CHEST 1 VIEW COMPARISON:  November 15, 2020 FINDINGS: The mediastinal contour is normal. The heart size is mildly enlarged. Increased central pulmonary vessel caliber noted. There is no focal pneumonia, pulmonary edema or pleural effusion. The visualized skeletal structures are stable. IMPRESSION: Mild congestive heart failure. Electronically Signed   By: Abelardo Diesel M.D.   On: 04/04/2022 12:03   CT HEAD WO CONTRAST (5MM)  Result Date: 04/04/2022 CLINICAL DATA:  Mental status change with unknown cause EXAM: CT HEAD WITHOUT CONTRAST TECHNIQUE: Contiguous axial images were obtained from the base of the skull through the vertex without intravenous contrast. RADIATION DOSE  REDUCTION: This exam was performed according to the departmental dose-optimization program which includes automated exposure control, adjustment of the mA and/or kV according to patient size and/or use of iterative reconstruction technique. COMPARISON:  04/29/2021 FINDINGS: Brain: No evidence of acute infarction, hemorrhage, hydrocephalus, extra-axial collection or mass lesion/mass effect. Vascular: No hyperdense vessel or unexpected calcification. Skull: Normal. Negative for fracture or focal lesion. Sinuses/Orbits: Extensive opacification of the right maxillary sinus, chronic when compared to prior and with sclerotic wall thickening. Nonspecific calcification lateral to the left pterygoid body, stable and non worrisome. IMPRESSION: 1. No acute or interval finding. 2. Chronic, focal right maxillary sinusitis. Electronically Signed   By: Jorje Guild M.D.   On: 04/04/2022 11:11            LOS: 2 days      Emeterio Reeve, DO [name] 04/06/2022, 2:13 PM    Dictation software may have been used to generate the above note. Typos may occur and escape review in typed/dictated notes. Please contact Dr Sheppard Coil directly for clarity if needed.  Staff may message me via secure chat in [place]  but this may not receive an immediate response,  please page me for urgent matters!  If 7PM-7AM, please contact night coverage [email or web address]

## 2022-04-06 NOTE — Consult Note (Signed)
PHARMACY CONSULT NOTE - FOLLOW UP  Pharmacy Consult for Electrolyte Monitoring and Replacement   Recent Labs: Potassium (mmol/L)  Date Value  04/06/2022 2.9 (L)  11/30/2013 3.5   Magnesium (mg/dL)  Date Value  04/06/2022 1.6 (L)   Calcium (mg/dL)  Date Value  04/06/2022 7.7 (L)   Calcium, Total (mg/dL)  Date Value  11/30/2013 8.7   Albumin (g/dL)  Date Value  04/02/2022 3.1 (L)  11/30/2013 3.3 (L)   Phosphorus (mg/dL)  Date Value  04/05/2022 3.1   Sodium (mmol/L)  Date Value  04/06/2022 137  05/09/2018 143  11/30/2013 145     Assessment: 71 yo female presneted with AMS, diarrhea, and fever.  Patient tested positive for influenza A.  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  Patient was also found to be hypokalemic, which has not resolved after multiple replacement orders.  12/2: 75mq PO KCL + 277m from IV KPhos + 10 mEq IV KCL 12/3: 60 meq Kcl IV 12/4: KCL 4035mPO x2; followed by add'l 69m48mO x3   Goal of Therapy:  WNL  Plan:  UOP 0.1ml/8m K 2.6>2.9 after receiving KCL 69mEq51mx2 (MD order) on 12/4 AM. Remains low on repeat but trending appropriately, Will order 69mEq 92mx3 doses. Mg 1.7>1.6:  Will replete with MgSO 4g IV x1 Next labs 12/5 '@0500'$ .   BrandonLorna DibbleD Clinical Pharmacist 04/06/2022 5:53 PM

## 2022-04-07 ENCOUNTER — Inpatient Hospital Stay: Payer: PPO

## 2022-04-07 ENCOUNTER — Ambulatory Visit: Payer: PPO | Admitting: Oncology

## 2022-04-07 DIAGNOSIS — R5081 Fever presenting with conditions classified elsewhere: Secondary | ICD-10-CM

## 2022-04-07 DIAGNOSIS — A419 Sepsis, unspecified organism: Secondary | ICD-10-CM

## 2022-04-07 DIAGNOSIS — C9200 Acute myeloblastic leukemia, not having achieved remission: Secondary | ICD-10-CM | POA: Diagnosis not present

## 2022-04-07 DIAGNOSIS — E876 Hypokalemia: Secondary | ICD-10-CM

## 2022-04-07 DIAGNOSIS — R197 Diarrhea, unspecified: Secondary | ICD-10-CM | POA: Diagnosis not present

## 2022-04-07 DIAGNOSIS — J101 Influenza due to other identified influenza virus with other respiratory manifestations: Secondary | ICD-10-CM | POA: Diagnosis not present

## 2022-04-07 DIAGNOSIS — F418 Other specified anxiety disorders: Secondary | ICD-10-CM

## 2022-04-07 DIAGNOSIS — D709 Neutropenia, unspecified: Secondary | ICD-10-CM

## 2022-04-07 DIAGNOSIS — R6520 Severe sepsis without septic shock: Secondary | ICD-10-CM

## 2022-04-07 DIAGNOSIS — E039 Hypothyroidism, unspecified: Secondary | ICD-10-CM | POA: Diagnosis not present

## 2022-04-07 LAB — BLOOD CULTURE ID PANEL (REFLEXED) - BCID2

## 2022-04-07 LAB — BASIC METABOLIC PANEL
Anion gap: 7 (ref 5–15)
BUN: 7 mg/dL — ABNORMAL LOW (ref 8–23)
CO2: 20 mmol/L — ABNORMAL LOW (ref 22–32)
Calcium: 7.7 mg/dL — ABNORMAL LOW (ref 8.9–10.3)
Chloride: 112 mmol/L — ABNORMAL HIGH (ref 98–111)
Creatinine, Ser: 0.62 mg/dL (ref 0.44–1.00)
GFR, Estimated: >60 mL/min (ref >60)
Glucose, Bld: 117 mg/dL — ABNORMAL HIGH (ref 70–99)
Potassium: 3.3 mmol/L — ABNORMAL LOW (ref 3.5–5.1)
Sodium: 139 mmol/L (ref 135–145)

## 2022-04-07 LAB — CBC
HCT: 27.3 % — ABNORMAL LOW (ref 36.0–46.0)
Hemoglobin: 9.4 g/dL — ABNORMAL LOW (ref 12.0–15.0)
MCH: 29.7 pg (ref 26.0–34.0)
MCHC: 34.4 g/dL (ref 30.0–36.0)
MCV: 86.4 fL (ref 80.0–100.0)
Platelets: 40 10*3/uL — ABNORMAL LOW (ref 150–400)
RBC: 3.16 MIL/uL — ABNORMAL LOW (ref 3.87–5.11)
RDW: 16.3 % — ABNORMAL HIGH (ref 11.5–15.5)
WBC: 0.3 10*3/uL — CL (ref 4.0–10.5)
nRBC: 0 % (ref 0.0–0.2)

## 2022-04-07 LAB — BPAM RBC
Blood Product Expiration Date: 202312112359
ISSUE DATE / TIME: 202312041610
Unit Type and Rh: 5100

## 2022-04-07 LAB — PHOSPHORUS: Phosphorus: 1.9 mg/dL — ABNORMAL LOW (ref 2.5–4.6)

## 2022-04-07 LAB — HEMOGLOBIN AND HEMATOCRIT, BLOOD
HCT: 30.5 % — ABNORMAL LOW (ref 36.0–46.0)
Hemoglobin: 10.6 g/dL — ABNORMAL LOW (ref 12.0–15.0)

## 2022-04-07 LAB — TYPE AND SCREEN
ABO/RH(D): A POS
Antibody Screen: NEGATIVE
Unit division: 0

## 2022-04-07 LAB — SURGICAL PATHOLOGY

## 2022-04-07 LAB — MAGNESIUM: Magnesium: 2.3 mg/dL (ref 1.7–2.4)

## 2022-04-07 MED ORDER — POTASSIUM PHOSPHATES 15 MMOLE/5ML IV SOLN
15.0000 mmol | Freq: Once | INTRAVENOUS | Status: AC
  Administered 2022-04-07: 15 mmol via INTRAVENOUS
  Filled 2022-04-07: qty 5

## 2022-04-07 NOTE — Progress Notes (Signed)
PHARMACY - PHYSICIAN COMMUNICATION CRITICAL VALUE ALERT - BLOOD CULTURE IDENTIFICATION (BCID)  Debra Hale is an 71 y.o. female who presented to [facility] on 04/04/2022 with a chief complaint of sepsis, influenza A.   Assessment:  Staph Epi in 1 of 4 bottles ,  no resistance detected.  Most likely a contaminant.  (include suspected source if known)  Name of physician (or Provider) Contacted: Neomia Glass, NP   Current antibiotics: Cefepime 2 gm IV Q12H   Changes to prescribed antibiotics recommended:  Patient is on recommended antibiotics - No changes needed  Results for orders placed or performed during the hospital encounter of 04/04/22  Blood Culture ID Panel (Reflexed) (Collected: 04/04/2022  3:01 PM)  Result Value Ref Range   Enterococcus faecalis NOT DETECTED NOT DETECTED   Enterococcus Faecium NOT DETECTED NOT DETECTED   Listeria monocytogenes NOT DETECTED NOT DETECTED   Staphylococcus species DETECTED (A) NOT DETECTED   Staphylococcus aureus (BCID) NOT DETECTED NOT DETECTED   Staphylococcus epidermidis DETECTED (A) NOT DETECTED   Staphylococcus lugdunensis NOT DETECTED NOT DETECTED   Streptococcus species NOT DETECTED NOT DETECTED   Streptococcus agalactiae NOT DETECTED NOT DETECTED   Streptococcus pneumoniae NOT DETECTED NOT DETECTED   Streptococcus pyogenes NOT DETECTED NOT DETECTED   A.calcoaceticus-baumannii NOT DETECTED NOT DETECTED   Bacteroides fragilis NOT DETECTED NOT DETECTED   Enterobacterales NOT DETECTED NOT DETECTED   Enterobacter cloacae complex NOT DETECTED NOT DETECTED   Escherichia coli NOT DETECTED NOT DETECTED   Klebsiella aerogenes NOT DETECTED NOT DETECTED   Klebsiella oxytoca NOT DETECTED NOT DETECTED   Klebsiella pneumoniae NOT DETECTED NOT DETECTED   Proteus species NOT DETECTED NOT DETECTED   Salmonella species NOT DETECTED NOT DETECTED   Serratia marcescens NOT DETECTED NOT DETECTED   Haemophilus influenzae NOT DETECTED NOT DETECTED    Neisseria meningitidis NOT DETECTED NOT DETECTED   Pseudomonas aeruginosa NOT DETECTED NOT DETECTED   Stenotrophomonas maltophilia NOT DETECTED NOT DETECTED   Candida albicans NOT DETECTED NOT DETECTED   Candida auris NOT DETECTED NOT DETECTED   Candida glabrata NOT DETECTED NOT DETECTED   Candida krusei NOT DETECTED NOT DETECTED   Candida parapsilosis NOT DETECTED NOT DETECTED   Candida tropicalis NOT DETECTED NOT DETECTED   Cryptococcus neoformans/gattii NOT DETECTED NOT DETECTED   Methicillin resistance mecA/C NOT DETECTED NOT DETECTED    , D 04/07/2022  4:04 AM

## 2022-04-07 NOTE — Consult Note (Signed)
PHARMACY CONSULT NOTE - FOLLOW UP  Pharmacy Consult for Electrolyte Monitoring and Replacement   Recent Labs: Potassium (mmol/L)  Date Value  04/07/2022 3.3 (L)  11/30/2013 3.5   Magnesium (mg/dL)  Date Value  04/07/2022 2.3   Calcium (mg/dL)  Date Value  04/07/2022 7.7 (L)   Calcium, Total (mg/dL)  Date Value  11/30/2013 8.7   Albumin (g/dL)  Date Value  04/02/2022 3.1 (L)  11/30/2013 3.3 (L)   Phosphorus (mg/dL)  Date Value  04/07/2022 1.9 (L)   Sodium (mmol/L)  Date Value  04/07/2022 139  05/09/2018 143  11/30/2013 145     Assessment: 71 yo female presneted with AMS, diarrhea, and fever.  Patient tested positive for influenza A.  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  Patient was also found to be hypokalemic, which has not resolved after multiple replacement orders.  12/2: 85mq PO KCL + 247m from IV KPhos + 10 mEq IV KCL 12/3: 60 meq Kcl IV 12/4: KCL 4036mPO x2; followed by add'l 6m73mO x3   Goal of Therapy:  WNL  Plan:  K 2.9>3.3 and Phos 1.9 Will give Kphos 15mm62mV x 1 Recheck electrolytes 12/6 @ [address] Clinical Pharmacist 04/07/2022 7:51 AM

## 2022-04-07 NOTE — Progress Notes (Addendum)
PROGRESS NOTE    Debra Hale   TKP:[id number] DOB: November 20, 1950  DOA: 04/04/2022 Date of Service: 04/07/22 PCP: [name], MD     Brief Narrative / Hospital Course:  Debra Hale is a 71 y.o. 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  She was recently hospitalized from 11/28 - 11/30 due to right flank pain with hydronephrosis, no obstructive stone, likely recently passed stone.  Patient was treated with antibiotics for complicated UTI.  12/02:  in ED, she is mildly confused, lethargic, with slow responsiveness.  Patient moves all extremities normally.  No facial droop or slurred speech.  She is orientated x 3. (+) pancytopenia with positive Flu A, WBC 0.5, hemoglobin 8.9, platelet 53 (recent baseline WBC 0.7, hemoglobin 7.0, platelet 58 on 04/02/2022), UA(+) blood but no UTI. Cdiff and GI panel neg. COVID PCR negative, GFR> 60, potassium 2.5, magnesium 2.0.  Temperature up to 102.7, blood pressure 130/51, heart rate 93, RR 21, SpO2 94% on room air.  Chest x-ray mild interstitial pulmonary edema.  CT head negative.  Patient is admitted to telemetry bed as inpatient. Message sent to Dr. Rogue Bussing of oncology for consult. 12/03: febrile to 102.6 early this AM. Remains pancytopenic, drop Hgb likely hemodilutional. Lactic acid trended down. Still significantly hypokalemic. Has been on room air and comfortable.  12/04: still stable on room air, hypokalemic. Anemia worse, 1 unit irradiated PRBC ordered. Dr [name] [facility]) saw patient,   Patient was scheduled to start treatment with  Vidaza and venetoclax this week.  Cancel treatment this week and will rediscuss options on Monday 12/11. Neutropenia and thrombocytopenia are slightly worse likely secondary to progressive disease and influenza.  12/05: VSS, temp slightly up at 99.62F. Hgb to 9.4 this AM. BMP shows K improving to 3.3. One BCx (+) Gram Pos Cocci --> staph epidermidis, other BCx NGx3d, positive culture likely contaminant. UCx NG. D/c abx this am. K still low. Will keep tonight and if K improved and not worse off, anticipate possible discharge tomorrow.    Consultants:  Oncology   Procedures: None       ASSESSMENT & PLAN:   Principal Problem:   Influenza A Active Problems:   Severe sepsis (HCC)   Neutropenia with fever (HCC)   Acute metabolic encephalopathy   Diarrhea   Pancytopenia (HCC)   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HCC)   Hypokalemia   Hypophosphatemia   Chronic diastolic CHF (congestive heart failure) (HCC)   Hypothyroidism   Depression with anxiety  Severe sepsis due to Influenza A:  Patient has positive flu A PCR.   RULED OUT: UTI, bacteremia, CDiff and other GI infection meets criteria for severe sepsis on admission with WBC 0.5, heart rate 93, RR 21, fever 102.7.  Lactic acid 2.3, 2.0. Tamiflu 75 mg twice daily prn tylenol for fever   Pancytopenia Hgb dropped today to 6.8 1 unit PRBC ordered HemOnc to see - Neutropenia and thrombocytopenia are slightly worse likely secondary to progressive disease and influenza.   Neutropenia with fever ([place]):  pt has Flu A, which is likely the source of her fever Cannot completely rule out bacterial infection due to neutropenia. Blood culture NG x2d Urine culture NG GI  PCR neg  Started vancomycin and cefepime 04/04/2022 --> if culture negative, may consider d/c both unless concerns from HemOnc --> BCx contaminant and negative, stopped abx, pt received last dose early AM on  04/07/22  Neutropenia precaution HemOnc consult - Neutropenia  slightly worse likely secondary to progressive disease and influenza.     Acute metabolic encephalopathy - improved:  Likely multifactorial etiology, including flu A, electrolytes disturbance and severe sepsis.   CT head negative. Monitor    Diarrhea:  This is chronic issue. negative C diff and GI path panel   Pancytopenia ([place]):  Hemoglobin is better than recent baseline. Follow-up with CBC HemOnc consult    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[place]):  pt is following up with Dr. [name] of oncology Recent bone marrow biopsy revealed mildly progressive disease with her total blast count of approximately 15%.  Up from 5% in March. Patient was scheduled to start treatment with Vidaza and venetoclax this week.  Cancel treatment this week and will rediscuss options on Monday December to 11, 2023.    Hypokalemia and phosphatemia:  Repleted potassium and phosphorus Follow labs  Pharmacy consulted to assist given very minimal response to supplementation Urine K/Cr ordered    Chronic diastolic CHF (congestive heart failure) ([place]):  2D echo on 09/05/2020 showed EF acetic acid 5% with grade 1 diastolic dysfunction.  BNP 369, chest x-ray showed mild interstitial edema, but no leg edema or JVD.  No shortness of breath.  Does not seem to have acute CHF exacerbation Watch volume status closely Did not give diuretics intiially due to sepsis   Hypothyroidism Synthroid   Depression with anxiety Continue home medications        DVT prophylaxis: SCD Pertinent IV fluids/nutrition: no continuous IV fluids  Central lines / invasive devices: none  Code Status: FULL CODE   Disposition: inpatient  TOC needs: none at this time  Barriers to discharge / significant pending items: hypokalemia            Subjective:  Patient reports feeling tired, a bit better compared to yesterday  Denies CP/SOB.  Pain controlled.  Denies focal weakness.  Tolerating diet.  Reports no concerns w/  urination/defecation.   Family communication: pt declined call to anyone today   Objective Findings:  Vitals:   04/06/22 1946 04/06/22 2102 04/07/22 0434 04/07/22 0858  BP: 128/67 128/67 (!) 145/67 (!) 131/57  Pulse: 76 76 81 79  Resp: _0 Temp: 98.5 F (36.9 C) 98.5 F (36.9 C) 99.5 F (37.5 C) 99 F (37.2 C)  TempSrc: Oral  Oral   SpO2:  97% 94% 95%  Weight:      Height:        Intake/Output Summary (Last 24 hours) at 04/07/2022 1436 Last data filed at 04/06/2022 1946 Gross per 24 hour  Intake 1011.33 ml  Output --  Net 1011.33 ml   Filed Weights   04/04/22 1037  Weight: 61 kg    Examination:  Constitutional:  VS as above General Appearance: alert, well-developed, well-nourished, NAD Respiratory: Normal respiratory effort No wheeze No rhonchi No rales Cardiovascular: S1/S2 normal + murmur RRR Gastrointestinal: No tenderness Musculoskeletal:  No clubbing/cyanosis of digits Symmetrical movement in all extremities Neurological: No cranial nerve deficit on limited exam Alert Psychiatric: Normal judgment/insight Normal mood and affect       Scheduled Medications:   escitalopram  20 mg Oral [id number]   levothyroxine  125 mcg Oral Q0600   oseltamivir  75 mg  Oral BID   pantoprazole  40 mg Oral Daily    Continuous Infusions:  potassium PHOSPHATE IVPB (in mmol) 15 mmol (04/07/22 1000)    PRN Medications:  acetaminophen, albuterol, clonazePAM, dextromethorphan-guaiFENesin, diphenoxylate-atropine, ondansetron (ZOFRAN) IV  Antimicrobials:  Anti-infectives (From admission, onward)    Start     Dose/Rate Route Frequency Ordered Stop   04/05/22 1700  vancomycin (VANCOCIN) IVPB 1000 mg/200 mL premix  Status:  Discontinued        1,000 mg 200 mL/hr over 60 Minutes Intravenous Every 24 hours 04/04/22 1604 04/06/22 1237   04/04/22 2200  oseltamivir (TAMIFLU) capsule 75 mg        75 mg Oral 2 times daily 04/04/22 1548 04/09/22 2159   04/04/22  1630  vancomycin (VANCOREADY) IVPB 1250 mg/250 mL        1,250 mg 166.7 mL/hr over 90 Minutes Intravenous  Once 04/04/22 1604 04/04/22 1850   04/04/22 1630  ceFEPIme (MAXIPIME) 2 g in sodium chloride 0.9 % 100 mL IVPB  Status:  Discontinued        2 g 200 mL/hr over 30 Minutes Intravenous Every 12 hours 04/04/22 1604 04/07/22 0741           Data Reviewed: I have personally reviewed following labs and imaging studies  CBC: Recent Labs  Lab 03/31/22 1630 04/01/22 0446 04/02/22 0515 04/04/22 1050 04/05/22 0753 04/06/22 0422 04/06/22 2316 04/07/22 0605  WBC 1.0*   < > 0.7* 0.5*  0.5* 0.4* 0.3*  --  0.3*  NEUTROABS 0.4*  --  0.3* 0.3*  --   --   --   --   HGB 8.9*   < > 7.0* 8.9*  9.1* 7.8* 6.8* 10.6* 9.4*  HCT 25.7*   < > 21.2* 26.2*  26.6* 23.3* 20.1* 30.5* 27.3*  MCV 88.0   < > 91.8 88.8  89.3 90.7 88.2  --  86.4  PLT 93*   < > 58* 54*  55* 40* 36*  --  40*   < > = values in this interval not displayed.   Basic Metabolic Panel: Recent Labs  Lab 03/31/22 1630 04/01/22 0446 04/02/22 0515 04/04/22 1050 04/04/22 1206 04/05/22 0753 04/05/22 1453 04/06/22 0422 04/06/22 1625 04/07/22 0605  NA  --    < > 143 136  --  139  --  137  --  139  K  --    < > 4.0 2.5*  --  2.7* 2.9* 2.6* 2.9* 3.3*  CL  --    < > 117* 103  --  113*  --  112*  --  112*  CO2  --    < > 24 23  --  19*  --  19*  --  20*  GLUCOSE  --    < > 106* 136*  --  115*  --  96  --  117*  BUN  --    < > 16 14  --  12  --  7*  --  7*  CREATININE  --    < > 0.78 0.82  --  0.82  --  0.73  --  0.62  CALCIUM  --    < > 8.0* 8.2*  --  7.9*  --  7.7*  --  7.7*  MG 1.5*  --  2.0  --  1.7  --   --   --  1.6* 2.3  PHOS  --   --  2.9 2.4*  --  3.1  --   --   --  1.9*   < > = values in this interval not displayed.   GFR: Estimated Creatinine Clearance: 54.9 mL/min (by C-G formula based on SCr of 0.62 mg/dL). Liver Function Tests: Recent Labs  Lab 03/31/22 1630 04/01/22 0446 04/02/22 0515  AST 16 13* 9*   ALT _0 ALKPHOS 78 69 68  BILITOT 1.2 1.3* 0.8  PROT 6.5 5.3* 5.3*  ALBUMIN 4.0 3.2* 3.1*   No results for input(s): "LIPASE", "AMYLASE" in the last 168 hours. No results for input(s): "AMMONIA" in the last 168 hours. Coagulation Profile: Recent Labs  Lab 04/04/22 1500  INR 1.3*   Cardiac Enzymes: No results for input(s): "CKTOTAL", "CKMB", "CKMBINDEX", "TROPONINI" in the last 168 hours. BNP (last 3 results) No results for input(s): "PROBNP" in the last 8760 hours. HbA1C: No results for input(s): "HGBA1C" in the last 72 hours. CBG: No results for input(s): "GLUCAP" in the last 168 hours. Lipid Profile: No results for input(s): "CHOL", "HDL", "LDLCALC", "TRIG", "CHOLHDL", "LDLDIRECT" in the last 72 hours. Thyroid Function Tests: No results for input(s): "TSH", "T4TOTAL", "FREET4", "T3FREE", "THYROIDAB" in the last 72 hours. Anemia Panel: No results for input(s): "VITAMINB12", "FOLATE", "FERRITIN", "TIBC", "IRON", "RETICCTPCT" in the last 72 hours. Most Recent Urinalysis On File:     Component Value Date/Time   COLORURINE YELLOW (A) 04/04/2022 2351   APPEARANCEUR CLEAR (A) 04/04/2022 2351   APPEARANCEUR Hazy 11/30/2013 1821   LABSPEC 1.018 04/04/2022 2351   LABSPEC 1.023 11/30/2013 1821   PHURINE 5.0 04/04/2022 2351   GLUCOSEU NEGATIVE 04/04/2022 2351   GLUCOSEU NEGATIVE 09/11/2014 0810   HGBUR LARGE (A) 04/04/2022 2351   BILIRUBINUR NEGATIVE 04/04/2022 2351   BILIRUBINUR Negative 11/30/2013 1821   KETONESUR 5 (A) 04/04/2022 2351   PROTEINUR 100 (A) 04/04/2022 2351   UROBILINOGEN 0.2 09/11/2014 0810   NITRITE NEGATIVE 04/04/2022 2351   LEUKOCYTESUR NEGATIVE 04/04/2022 2351   LEUKOCYTESUR Negative 11/30/2013 1821   Sepsis Labs: _1 (procalcitonin:4,lacticidven:4)  Recent Results (from the past 240 hour(s))  Culture, blood (Routine X 2) w Reflex to ID Panel     Status: Abnormal   Collection Time: 04/01/22 10:20 AM   Specimen: BLOOD  Result Value Ref  Range Status   Specimen Description   Final    BLOOD RIGHT ANTECUBITAL Performed at [facility], [address] [place], [place] [address]    Special Requests   Final    BOTTLES DRAWN AEROBIC AND ANAEROBIC Blood Culture results may not be optimal due to an excessive volume of blood received in culture bottles Performed at [facility], [address] [place], [place] [address]    Culture  Setup Time   Final    GRAM POSITIVE COCCI ANAEROBIC BOTTLE ONLY CRITICAL RESULT CALLED TO, READ BACK BY AND VERIFIED WITH: Rito Ehrlich PHARMD 1334 04/02/22 HNM    Culture (A)  Final    STAPHYLOCOCCUS CAPITIS THE SIGNIFICANCE OF ISOLATING THIS ORGANISM FROM A SINGLE SET OF BLOOD CULTURES WHEN MULTIPLE SETS ARE DRAWN IS UNCERTAIN. PLEASE NOTIFY THE MICROBIOLOGY DEPARTMENT WITHIN ONE WEEK IF SPECIATION AND SENSITIVITIES ARE REQUIRED. Performed at [facility], [place] [address] [place], [place] [address]    Report Status 04/04/2022 FINAL  Final  Blood Culture ID Panel (Reflexed)     Status: Abnormal   Collection Time: 04/01/22 10:20 AM  Result Value Ref Range Status   Enterococcus faecalis NOT DETECTED NOT DETECTED Final   Enterococcus Faecium NOT DETECTED NOT DETECTED Final   Listeria monocytogenes NOT  DETECTED NOT DETECTED Final   Staphylococcus species DETECTED (A) NOT DETECTED Final    Comment: CRITICAL RESULT CALLED TO, READ BACK BY AND VERIFIED WITH: Rito Ehrlich PHARMD [id number] 04/02/22 HNM    Staphylococcus aureus (BCID) NOT DETECTED NOT DETECTED Final   Staphylococcus epidermidis NOT DETECTED NOT DETECTED Final   Staphylococcus lugdunensis NOT DETECTED NOT DETECTED Final   Streptococcus species NOT DETECTED NOT DETECTED Final   Streptococcus agalactiae NOT DETECTED NOT DETECTED Final   Streptococcus pneumoniae NOT DETECTED NOT DETECTED Final   Streptococcus pyogenes NOT DETECTED NOT DETECTED Final   A.calcoaceticus-baumannii NOT DETECTED NOT DETECTED Final    Bacteroides fragilis NOT DETECTED NOT DETECTED Final   Enterobacterales NOT DETECTED NOT DETECTED Final   Enterobacter cloacae complex NOT DETECTED NOT DETECTED Final   Escherichia coli NOT DETECTED NOT DETECTED Final   Klebsiella aerogenes NOT DETECTED NOT DETECTED Final   Klebsiella oxytoca NOT DETECTED NOT DETECTED Final   Klebsiella pneumoniae NOT DETECTED NOT DETECTED Final   Proteus species NOT DETECTED NOT DETECTED Final   Salmonella species NOT DETECTED NOT DETECTED Final   Serratia marcescens NOT DETECTED NOT DETECTED Final   Haemophilus influenzae NOT DETECTED NOT DETECTED Final   Neisseria meningitidis NOT DETECTED NOT DETECTED Final   Pseudomonas aeruginosa NOT DETECTED NOT DETECTED Final   Stenotrophomonas maltophilia NOT DETECTED NOT DETECTED Final   Candida albicans NOT DETECTED NOT DETECTED Final   Candida auris NOT DETECTED NOT DETECTED Final   Candida glabrata NOT DETECTED NOT DETECTED Final   Candida krusei NOT DETECTED NOT DETECTED Final   Candida parapsilosis NOT DETECTED NOT DETECTED Final   Candida tropicalis NOT DETECTED NOT DETECTED Final   Cryptococcus neoformans/gattii NOT DETECTED NOT DETECTED Final    Comment: Performed at [facility], [address] [place], [place] [address]  Culture, blood (Routine X 2) w Reflex to ID Panel     Status: None   Collection Time: 04/01/22 10:25 AM   Specimen: BLOOD  Result Value Ref Range Status   Specimen Description BLOOD BLOOD LEFT ARM  Final   Special Requests   Final    BOTTLES DRAWN AEROBIC AND ANAEROBIC Blood Culture results may not be optimal due to an excessive volume of blood received in culture bottles   Culture   Final    NO GROWTH 5 DAYS Performed at [facility], [address] [place], [place] [address]    Report Status 04/06/2022 FINAL  Final  Urine Culture     Status: Abnormal   Collection Time: 04/01/22 10:50 AM   Specimen: Urine, Random  Result Value Ref Range Status    Specimen Description   Final    URINE, RANDOM Performed at [facility], [address], [place], [place] [address]    Special Requests   Final    NONE Performed at [facility], [address] [place], [place] [address]    Culture MULTIPLE SPECIES PRESENT, SUGGEST RECOLLECTION (A)  Final   Report Status 04/02/2022 FINAL  Final  Resp Panel by RT-PCR (Flu A&B, Covid) Anterior Nasal Swab     Status: Abnormal   Collection Time: 04/04/22 12:07 PM   Specimen: Anterior Nasal Swab  Result Value Ref Range Status   SARS Coronavirus 2 by RT PCR NEGATIVE NEGATIVE Final    Comment: (NOTE) SARS-CoV-2 target nucleic acids are NOT DETECTED.  The SARS-CoV-2 RNA is generally detectable in upper respiratory specimens during the acute phase of infection. The lowest concentration of SARS-CoV-2  viral copies this assay can detect is 138 copies/mL. A negative result does not preclude SARS-Cov-2 infection and should not be used as the sole basis for treatment or other patient management decisions. A negative result may occur with  improper specimen collection/handling, submission of specimen other than nasopharyngeal swab, presence of viral mutation(s) within the areas targeted by this assay, and inadequate number of viral copies(<138 copies/mL). A negative result must be combined with clinical observations, patient history, and epidemiological information. The expected result is Negative.  Fact Sheet for Patients:  EntrepreneurPulse.com.au  Fact Sheet for Healthcare Providers:  IncredibleEmployment.be  This test is no t yet approved or cleared by the [place] FDA and  has been authorized for detection and/or diagnosis of SARS-CoV-2 by FDA under an Emergency Use Authorization (EUA). This EUA will remain  in effect (meaning this test can be used) for the duration of the COVID-19 declaration under Section 564(b)(1) of the Act,  21 U.S.C.section 360bbb-3(b)(1), unless the authorization is terminated  or revoked sooner.       Influenza A by PCR POSITIVE (A) NEGATIVE Final   Influenza B by PCR NEGATIVE NEGATIVE Final    Comment: (NOTE) The Xpert Xpress SARS-CoV-2/FLU/RSV plus assay is intended as an aid in the diagnosis of influenza from Nasopharyngeal swab specimens and should not be used as a sole basis for treatment. Nasal washings and aspirates are unacceptable for Xpert Xpress SARS-CoV-2/FLU/RSV testing.  Fact Sheet for Patients: EntrepreneurPulse.com.au  Fact Sheet for Healthcare Providers: IncredibleEmployment.be  This test is not yet approved or cleared by the [place] FDA and has been authorized for detection and/or diagnosis of SARS-CoV-2 by FDA under an Emergency Use Authorization (EUA). This EUA will remain in effect (meaning this test can be used) for the duration of the COVID-19 declaration under Section 564(b)(1) of the Act, 21 U.S.C. section 360bbb-3(b)(1), unless the authorization is terminated or revoked.  Performed at [facility], [address] [place], [place] [address]   Culture, blood (x 2)     Status: None (Preliminary result)   Collection Time: 04/04/22  3:01 PM   Specimen: BLOOD  Result Value Ref Range Status   Specimen Description BLOOD RIGHT HAND  Final   Special Requests   Final    BOTTLES DRAWN AEROBIC AND ANAEROBIC Blood Culture adequate volume   Culture   Final    NO GROWTH 3 DAYS Performed at [facility], [address], [place], [place] [address]    Report Status PENDING  Incomplete  Culture, blood (x 2)     Status: None (Preliminary result)   Collection Time: 04/04/22  3:01 PM   Specimen: BLOOD  Result Value Ref Range Status   Specimen Description   Final    BLOOD LEFT ARM Performed at [facility], [address] [place], [place] [address]    Special Requests   Final    BOTTLES  DRAWN AEROBIC AND ANAEROBIC Blood Culture adequate volume Performed at [facility], [address] [place],  [address]    Culture  Setup Time   Final    GRAM POSITIVE COCCI AEROBIC BOTTLE ONLY CRITICAL RESULT CALLED TO, READ BACK BY AND VERIFIED WITH: JASON ROBBINS 04/07/2022 AT 0354 SRR Performed at [facility], [place] [address] [place],  [address]    Culture [facility] POSITIVE COCCI  Final   Report Status PENDING  Incomplete  Blood Culture ID Panel (Reflexed)     Status: Abnormal   Collection  Time: 04/04/22  3:01 PM  Result Value Ref Range Status   Enterococcus faecalis NOT DETECTED NOT DETECTED Final   Enterococcus Faecium NOT DETECTED NOT DETECTED Final   Listeria monocytogenes NOT DETECTED NOT DETECTED Final   Staphylococcus species DETECTED (A) NOT DETECTED Final    Comment: CRITICAL RESULT CALLED TO, READ BACK BY AND VERIFIED WITH: JASON ROBBINS 04/07/2022 AT 0354 SRR    Staphylococcus aureus (BCID) NOT DETECTED NOT DETECTED Final   Staphylococcus epidermidis DETECTED (A) NOT DETECTED Final    Comment: CRITICAL RESULT CALLED TO, READ BACK BY AND VERIFIED WITH: JASON ROBBINS 04/07/2022 AT 0354 SRR    Staphylococcus lugdunensis NOT DETECTED NOT DETECTED Final   Streptococcus species NOT DETECTED NOT DETECTED Final   Streptococcus agalactiae NOT DETECTED NOT DETECTED Final   Streptococcus pneumoniae NOT DETECTED NOT DETECTED Final   Streptococcus pyogenes NOT DETECTED NOT DETECTED Final   A.calcoaceticus-baumannii NOT DETECTED NOT DETECTED Final   Bacteroides fragilis NOT DETECTED NOT DETECTED Final   Enterobacterales NOT DETECTED NOT DETECTED Final   Enterobacter cloacae complex NOT DETECTED NOT DETECTED Final   Escherichia coli NOT DETECTED NOT DETECTED Final   Klebsiella aerogenes NOT DETECTED NOT DETECTED Final   Klebsiella oxytoca NOT DETECTED NOT DETECTED Final   Klebsiella pneumoniae NOT DETECTED NOT DETECTED Final   Proteus species NOT  DETECTED NOT DETECTED Final   Salmonella species NOT DETECTED NOT DETECTED Final   Serratia marcescens NOT DETECTED NOT DETECTED Final   Haemophilus influenzae NOT DETECTED NOT DETECTED Final   Neisseria meningitidis NOT DETECTED NOT DETECTED Final   Pseudomonas aeruginosa NOT DETECTED NOT DETECTED Final   Stenotrophomonas maltophilia NOT DETECTED NOT DETECTED Final   Candida albicans NOT DETECTED NOT DETECTED Final   Candida auris NOT DETECTED NOT DETECTED Final   Candida glabrata NOT DETECTED NOT DETECTED Final   Candida krusei NOT DETECTED NOT DETECTED Final   Candida parapsilosis NOT DETECTED NOT DETECTED Final   Candida tropicalis NOT DETECTED NOT DETECTED Final   Cryptococcus neoformans/gattii NOT DETECTED NOT DETECTED Final   Methicillin resistance mecA/C NOT DETECTED NOT DETECTED Final    Comment: Performed at [facility], [address] [place], [place] [address]  Urine Culture     Status: None   Collection Time: 04/04/22 11:51 PM   Specimen: Urine, Clean Catch  Result Value Ref Range Status   Specimen Description   Final    URINE, CLEAN CATCH Performed at [facility], [address], [place], [place] [address]    Special Requests   Final    NONE Performed at [facility], [address] [place], [place] [address]    Culture   Final    NO GROWTH Performed at [facility], [address] [place], [place] [address]    Report Status 04/06/2022 FINAL  Final  C Difficile Quick Screen w PCR reflex     Status: None   Collection Time: 04/04/22 11:59 PM   Specimen: Urine, Clean Catch; Stool  Result Value Ref Range Status   C Diff antigen NEGATIVE NEGATIVE Final   C Diff toxin NEGATIVE NEGATIVE Final   C Diff interpretation No C. difficile detected.  Final    Comment: Performed at [facility], [address] [place], [place] [address]  Gastrointestinal Panel by PCR , Stool     Status: None   Collection Time: 04/04/22 11:59  PM   Specimen: Urine, Clean Catch; Stool  Result Value Ref Range Status  Campylobacter species NOT DETECTED NOT DETECTED Final   Plesimonas shigelloides NOT DETECTED NOT DETECTED Final   Salmonella species NOT DETECTED NOT DETECTED Final   Yersinia enterocolitica NOT DETECTED NOT DETECTED Final   Vibrio species NOT DETECTED NOT DETECTED Final   Vibrio cholerae NOT DETECTED NOT DETECTED Final   Enteroaggregative E coli (EAEC) NOT DETECTED NOT DETECTED Final   Enteropathogenic E coli (EPEC) NOT DETECTED NOT DETECTED Final   Enterotoxigenic E coli (ETEC) NOT DETECTED NOT DETECTED Final   Shiga like toxin producing E coli (STEC) NOT DETECTED NOT DETECTED Final   Shigella/Enteroinvasive E coli (EIEC) NOT DETECTED NOT DETECTED Final   Cryptosporidium NOT DETECTED NOT DETECTED Final   Cyclospora cayetanensis NOT DETECTED NOT DETECTED Final   Entamoeba histolytica NOT DETECTED NOT DETECTED Final   Giardia lamblia NOT DETECTED NOT DETECTED Final   Adenovirus F40/41 NOT DETECTED NOT DETECTED Final   Astrovirus NOT DETECTED NOT DETECTED Final   Norovirus GI/GII NOT DETECTED NOT DETECTED Final   Rotavirus A NOT DETECTED NOT DETECTED Final   Sapovirus (I, II, IV, and V) NOT DETECTED NOT DETECTED Final    Comment: Performed at [facility], [address] [place], [place] [address]         Radiology Studies: DG Chest Portable 1 View  Result Date: 04/04/2022 CLINICAL DATA:  Confusion and altered mental status this morning. EXAM: PORTABLE CHEST 1 VIEW COMPARISON:  November 15, 2020 FINDINGS: The mediastinal contour is normal. The heart size is mildly enlarged. Increased central pulmonary vessel caliber noted. There is no focal pneumonia, pulmonary edema or pleural effusion. The visualized skeletal structures are stable. IMPRESSION: Mild congestive heart failure. Electronically Signed   By: Abelardo Diesel M.D.   On: 04/04/2022 12:03   CT HEAD WO CONTRAST (5MM)  Result Date:  04/04/2022 CLINICAL DATA:  Mental status change with unknown cause EXAM: CT HEAD WITHOUT CONTRAST TECHNIQUE: Contiguous axial images were obtained from the base of the skull through the vertex without intravenous contrast. RADIATION DOSE REDUCTION: This exam was performed according to the departmental dose-optimization program which includes automated exposure control, adjustment of the mA and/or kV according to patient size and/or use of iterative reconstruction technique. COMPARISON:  04/29/2021 FINDINGS: Brain: No evidence of acute infarction, hemorrhage, hydrocephalus, extra-axial collection or mass lesion/mass effect. Vascular: No hyperdense vessel or unexpected calcification. Skull: Normal. Negative for fracture or focal lesion. Sinuses/Orbits: Extensive opacification of the right maxillary sinus, chronic when compared to prior and with sclerotic wall thickening. Nonspecific calcification lateral to the left pterygoid body, stable and non worrisome. IMPRESSION: 1. No acute or interval finding. 2. Chronic, focal right maxillary sinusitis. Electronically Signed   By: Jorje Guild M.D.   On: 04/04/2022 11:11            LOS: 3 days      Emeterio Reeve, DO [name] 04/07/2022, 2:36 PM    Dictation software may have been used to generate the above note. Typos may occur and escape review in typed/dictated notes. Please contact Dr Sheppard Coil directly for clarity if needed.  Staff may message me via secure chat in [place]  but this may not receive an immediate response,  please page me for urgent matters!  If 7PM-7AM, please contact night coverage [email or web address]

## 2022-04-08 ENCOUNTER — Inpatient Hospital Stay: Payer: PPO

## 2022-04-08 DIAGNOSIS — J101 Influenza due to other identified influenza virus with other respiratory manifestations: Secondary | ICD-10-CM | POA: Diagnosis not present

## 2022-04-08 LAB — CBC WITH DIFFERENTIAL/PLATELET
Abs Immature Granulocytes: 0 10*3/uL (ref 0.00–0.07)
Basophils Absolute: 0 10*3/uL (ref 0.0–0.1)
Basophils Relative: 0 %
Eosinophils Absolute: 0 10*3/uL (ref 0.0–0.5)
Eosinophils Relative: 3 %
HCT: 27.3 % — ABNORMAL LOW (ref 36.0–46.0)
Hemoglobin: 9.5 g/dL — ABNORMAL LOW (ref 12.0–15.0)
Immature Granulocytes: 0 %
Lymphocytes Relative: 56 %
Lymphs Abs: 0.2 10*3/uL — ABNORMAL LOW (ref 0.7–4.0)
MCH: 29.8 pg (ref 26.0–34.0)
MCHC: 34.8 g/dL (ref 30.0–36.0)
MCV: 85.6 fL (ref 80.0–100.0)
Monocytes Absolute: 0 10*3/uL — ABNORMAL LOW (ref 0.1–1.0)
Monocytes Relative: 10 %
Neutro Abs: 0.1 10*3/uL — CL (ref 1.7–7.7)
Neutrophils Relative %: 31 %
Platelets: 43 10*3/uL — ABNORMAL LOW (ref 150–400)
RBC: 3.19 MIL/uL — ABNORMAL LOW (ref 3.87–5.11)
RDW: 15.5 % (ref 11.5–15.5)
Smear Review: NORMAL
WBC: 0.3 10*3/uL — CL (ref 4.0–10.5)
nRBC: 0 % (ref 0.0–0.2)

## 2022-04-08 LAB — CULTURE, BLOOD (ROUTINE X 2): Special Requests: ADEQUATE

## 2022-04-08 LAB — POTASSIUM: Potassium: 3.1 mmol/L — ABNORMAL LOW (ref 3.5–5.1)

## 2022-04-08 LAB — BASIC METABOLIC PANEL
Anion gap: 5 (ref 5–15)
BUN: 7 mg/dL — ABNORMAL LOW (ref 8–23)
CO2: 22 mmol/L (ref 22–32)
Calcium: 7.7 mg/dL — ABNORMAL LOW (ref 8.9–10.3)
Chloride: 111 mmol/L (ref 98–111)
Creatinine, Ser: 0.63 mg/dL (ref 0.44–1.00)
GFR, Estimated: >60 mL/min (ref >60)
Glucose, Bld: 107 mg/dL — ABNORMAL HIGH (ref 70–99)
Potassium: 2.9 mmol/L — ABNORMAL LOW (ref 3.5–5.1)
Sodium: 138 mmol/L (ref 135–145)

## 2022-04-08 LAB — PATHOLOGIST SMEAR REVIEW

## 2022-04-08 LAB — MAGNESIUM: Magnesium: 1.9 mg/dL (ref 1.7–2.4)

## 2022-04-08 LAB — PHOSPHORUS: Phosphorus: 3.4 mg/dL (ref 2.5–4.6)

## 2022-04-08 MED ORDER — POTASSIUM CHLORIDE CRYS ER 20 MEQ PO TBCR
40.0000 meq | EXTENDED_RELEASE_TABLET | Freq: Once | ORAL | Status: AC
  Administered 2022-04-08: 40 meq via ORAL
  Filled 2022-04-08: qty 2

## 2022-04-08 MED ORDER — POTASSIUM CHLORIDE CRYS ER 20 MEQ PO TBCR
40.0000 meq | EXTENDED_RELEASE_TABLET | ORAL | Status: AC
  Administered 2022-04-08 (×2): 40 meq via ORAL
  Filled 2022-04-08 (×2): qty 2

## 2022-04-08 NOTE — Consult Note (Signed)
PHARMACY CONSULT NOTE - FOLLOW UP  Pharmacy Consult for Electrolyte Monitoring and Replacement   Recent Labs: Potassium (mmol/L)  Date Value  04/08/2022 3.1 (L)  11/30/2013 3.5   Magnesium (mg/dL)  Date Value  04/08/2022 1.9   Calcium (mg/dL)  Date Value  04/08/2022 7.7 (L)   Calcium, Total (mg/dL)  Date Value  11/30/2013 8.7   Albumin (g/dL)  Date Value  04/02/2022 3.1 (L)  11/30/2013 3.3 (L)   Phosphorus (mg/dL)  Date Value  04/08/2022 3.4   Sodium (mmol/L)  Date Value  04/08/2022 138  05/09/2018 143  11/30/2013 145     Assessment: 71 yo female presneted with AMS, diarrhea, and fever.  Patient tested positive for influenza A.  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  Patient was also found to be hypokalemic, which has not resolved after multiple replacement orders.  12/2: 42mq PO KCL + 244m from IV KPhos + 10 mEq IV KCL 12/3: 60 meq Kcl IV 12/4: KCL 4068mPO x2; followed by add'l 72m83mO x3 12/5: Kphos 15mm31mV x 1 12/6: Kcl 40 meq po x 2   Goal of Therapy:  WNL  Plan:  K 3.3>2.9>3.1 Give Kcl 40 meq po x 1  Recheck electrolytes 12/7 @ 0500 GertyrmD Clinical Pharmacist 04/08/2022 2:44 PM

## 2022-04-08 NOTE — Progress Notes (Addendum)
[name] Progress Note  Patient: Debra Hale    ERX:[id number]  DOA: 04/04/2022     Date of Service: the patient was seen and examined on 04/08/2022  Chief Complaint  Patient presents with   Altered Mental Status   Brief hospital course: Debra Hale is a 71 y.o. 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  She was recently hospitalized from 11/28 - 11/30 due to right flank pain with hydronephrosis, no obstructive stone, likely recently passed stone.  Patient was treated with antibiotics for complicated UTI.  12/02:  in ED, she is mildly confused, lethargic, with slow responsiveness.  Patient moves all extremities normally.  No facial droop or slurred speech.  She is orientated x 3. (+) pancytopenia with positive Flu A, WBC 0.5, hemoglobin 8.9, platelet 53 (recent baseline WBC 0.7, hemoglobin 7.0, platelet 58 on 04/02/2022), UA(+) blood but no UTI. Cdiff and GI panel neg. COVID PCR negative, GFR> 60, potassium 2.5, magnesium 2.0.  Temperature up to 102.7, blood pressure 130/51, heart rate 93, RR 21, SpO2 94% on room air.  Chest x-ray mild interstitial pulmonary edema.  CT head negative.  Patient is admitted to telemetry bed as inpatient. Message sent to Dr. Rogue Bussing of oncology for consult. 12/03: febrile to 102.6 early this AM. Remains pancytopenic, drop Hgb likely hemodilutional. Lactic acid trended down. Still significantly hypokalemic. Has been on room air and comfortable.  12/04: still stable on room air, hypokalemic. Anemia worse, 1 unit irradiated PRBC ordered. Dr [name]  [facility]) saw patient,   Patient was scheduled to start treatment with Vidaza and venetoclax this week.  Cancel treatment this week and will rediscuss options on Monday 12/11. Neutropenia and thrombocytopenia are slightly worse likely secondary to progressive disease and influenza.  12/05: VSS, temp slightly up at 99.58F. Hgb to 9.4 this AM. BMP shows K improving to 3.3. One BCx (+) Gram Pos Cocci --> staph epidermidis, other BCx NGx3d, positive culture likely contaminant. UCx NG. D/c abx this am. K still low. Will keep tonight and if K improved and not worse off, anticipate possible discharge tomorrow.      Consultants:  Oncology    Procedures: None     Assessment and Plan: Principal Problem:   Influenza A Active Problems:   Severe sepsis (HCC)   Neutropenia with fever (HCC)   Acute metabolic encephalopathy   Diarrhea   Pancytopenia (HCC)   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HCC)   Hypokalemia   Hypophosphatemia   Chronic diastolic CHF (congestive heart failure) (HCC)   Hypothyroidism   Depression with anxiety   Severe sepsis due to Influenza A:  Patient has positive flu A PCR.   RULED OUT: UTI, bacteremia, CDiff and other GI infection meets criteria for severe sepsis on admission with WBC 0.5, heart rate 93, RR 21, fever 102.7.  Lactic acid 2.3, 2.0. Tamiflu 75 mg twice daily prn tylenol for fever   Pancytopenia Hgb dropped to 6.8, 1 unit PRBC  Hb 9.5 now  HemOnc to see - Neutropenia and thrombocytopenia are slightly worse likely secondary to progressive disease and influenza.    Neutropenia with fever ([place]):  pt has Flu A, which is likely the source of her  fever Cannot completely rule out bacterial infection due to neutropenia. Blood culture NG x2d Urine culture NG GI PCR neg  Started vancomycin and cefepime 04/04/2022 --> if culture negative, may consider d/c both unless concerns from HemOnc --> BCx contaminant and negative, stopped abx, pt received last dose early AM on   04/07/22  Neutropenia precaution HemOnc consult - Neutropenia slightly worse likely secondary to progressive disease and influenza.     Acute metabolic encephalopathy - improved:  Likely multifactorial etiology, including flu A, electrolytes disturbance and severe sepsis.   CT head negative. Monitor    Diarrhea:  This is chronic issue. negative C diff and GI path panel   Pancytopenia ([place]):  Hemoglobin is better than recent baseline. Follow-up with CBC HemOnc consult    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[place]):  pt is following up with Dr. [name] of oncology Recent bone marrow biopsy revealed mildly progressive disease with her total blast count of approximately 15%.  Up from 5% in March. Patient was scheduled to start treatment with Vidaza and venetoclax this week.  Cancel treatment this week and will rediscuss options on Monday December to 11, 2023.    Hypokalemia and phosphatemia:  Repleted potassium and phosphorus Follow labs  Pharmacy consulted to assist given very minimal response to supplementation Urine K/Cr ordered    Chronic diastolic CHF (congestive heart failure) ([place]):  2D echo on 09/05/2020 showed EF acetic acid 5% with grade 1 diastolic dysfunction.  BNP 369, chest x-ray showed mild interstitial edema, but no leg edema or JVD.  No shortness of breath.  Does not seem to have acute CHF exacerbation Watch volume status closely Did not give diuretics intiially due to sepsis   Hypothyroidism Synthroid   Depression with anxiety Continue home medications   Body mass index is 23.08 kg/m.  Interventions:       Diet: Heart healthy DVT Prophylaxis: SCD, pharmacological prophylaxis contraindicated due to thrombocytopenia    Advance goals of care discussion: Full code  Family Communication: family was not present at bedside, at the time of interview.  The pt provided permission to discuss medical plan with the family. Opportunity was given to ask question and all  questions were answered satisfactorily.   Disposition:  Pt is from Home, admitted with influenza A, hypokalemia, still has hypokalemia, which precludes a safe discharge. Discharge to Home, when clinically stable, still has hypokalemia, discharge planning in 1 to 2 days..  Subjective: No significant overnight events, patient stated that she always have chronic diarrhea due to IBS but had 1 loose stools yesterday, having mild cough, no fever.  Patient is slightly confused but AOx3.  Denies any chest pain or palpitation, no shortness of breath.   Physical Exam: General:  alert oriented to time, place, and person.  Appear in mild distress, affect appropriate Eyes: PERRLA ENT: Oral Mucosa Clear, moist  Neck: no JVD,  Cardiovascular: S1 and S2 Present, no Murmur,  Respiratory: good respiratory effort, Bilateral Air entry equal and Decreased, mild Crackles, no wheezes Abdomen: Bowel Sound present, Soft and no tenderness,  Skin: no rashes Extremities: no Pedal edema, no calf tenderness Neurologic: without any new focal findings Gait not checked due to patient safety concerns  Vitals:   04/07/22 1710 04/07/22 1934 04/08/22 0609 04/08/22 0935  BP: 138/67 135/64 138/60 137/62  Pulse: 78 76 78 78  Resp:  _0 Temp: 99.9 F (37.7 C) 98.7 F (37.1 C) 98.4 F (36.9 C) 98.2 F (36.8 C)  TempSrc:  Oral Oral  SpO2: 95% 96% 94% 92%  Weight:      Height:        Intake/Output Summary (Last 24 hours) at 04/08/2022 1311 Last data filed at 04/07/2022 1600 Gross per 24 hour  Intake 258 ml  Output --  Net 258 ml   Filed Weights   04/04/22 1037  Weight: 61 kg    Data Reviewed: I have personally reviewed and interpreted daily labs, tele strips, imagings as discussed above. I reviewed all nursing notes, pharmacy notes, vitals, pertinent old records I have discussed plan of care as described above with RN and patient/family.  CBC: Recent Labs  Lab 04/02/22 0515 04/04/22 1050  04/05/22 0753 04/06/22 0422 04/06/22 2316 04/07/22 0605 04/08/22 0728  WBC 0.7* 0.5*  0.5* 0.4* 0.3*  --  0.3* 0.3*  NEUTROABS 0.3* 0.3*  --   --   --   --  0.1*  HGB 7.0* 8.9*  9.1* 7.8* 6.8* 10.6* 9.4* 9.5*  HCT 21.2* 26.2*  26.6* 23.3* 20.1* 30.5* 27.3* 27.3*  MCV 91.8 88.8  89.3 90.7 88.2  --  86.4 85.6  PLT 58* 54*  55* 40* 36*  --  40* 43*   Basic Metabolic Panel: Recent Labs  Lab 04/02/22 0515 04/04/22 1050 04/04/22 1206 04/05/22 0753 04/05/22 1453 04/06/22 0422 04/06/22 1625 04/07/22 0605 04/08/22 0500 04/08/22 0728  NA 143 136  --  139  --  137  --  139  --  138  K 4.0 2.5*  --  2.7* 2.9* 2.6* 2.9* 3.3*  --  2.9*  CL 117* 103  --  113*  --  112*  --  112*  --  111  CO2 24 23  --  19*  --  19*  --  20*  --  22  GLUCOSE 106* 136*  --  115*  --  96  --  117*  --  107*  BUN 16 14  --  12  --  7*  --  7*  --  7*  CREATININE 0.78 0.82  --  0.82  --  0.73  --  0.62  --  0.63  CALCIUM 8.0* 8.2*  --  7.9*  --  7.7*  --  7.7*  --  7.7*  MG 2.0  --  1.7  --   --   --  1.6* 2.3 1.9  --   PHOS 2.9 2.4*  --  3.1  --   --   --  1.9* 3.4  --     Studies: No results found.  Scheduled Meds:  escitalopram  20 mg Oral BH-q7a   levothyroxine  125 mcg Oral Q0600   oseltamivir  75 mg Oral BID   pantoprazole  40 mg Oral Daily   Continuous Infusions: PRN Meds: acetaminophen, albuterol, clonazePAM, dextromethorphan-guaiFENesin, diphenoxylate-atropine, ondansetron (ZOFRAN) IV  Time spent: 35 minutes  Author: Val Riles. MD [facility] 04/08/2022 1:11 PM  To reach On-call, see care teams to locate the attending and reach out to them via [email or web address]. If 7PM-7AM, please contact night-coverage If you still have difficulty reaching the attending provider, please page the [facility] (Director on Call) for [name] on amion for assistance.

## 2022-04-08 NOTE — Consult Note (Signed)
PHARMACY CONSULT NOTE - FOLLOW UP  Pharmacy Consult for Electrolyte Monitoring and Replacement   Recent Labs: Potassium (mmol/L)  Date Value  04/07/2022 3.3 (L)  11/30/2013 3.5   Magnesium (mg/dL)  Date Value  04/08/2022 1.9   Calcium (mg/dL)  Date Value  04/07/2022 7.7 (L)   Calcium, Total (mg/dL)  Date Value  11/30/2013 8.7   Albumin (g/dL)  Date Value  04/02/2022 3.1 (L)  11/30/2013 3.3 (L)   Phosphorus (mg/dL)  Date Value  04/08/2022 3.4   Sodium (mmol/L)  Date Value  04/07/2022 139  05/09/2018 143  11/30/2013 145     Assessment: 71 yo female presneted with AMS, diarrhea, and fever.  Patient tested positive for influenza A.  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  Patient was also found to be hypokalemic, which has not resolved after multiple replacement orders.  12/2: 74mq PO KCL + 237m from IV KPhos + 10 mEq IV KCL 12/3: 60 meq Kcl IV 12/4: KCL 4059mPO x2; followed by add'l 31m54mO x3 12/5: Kphos 15mm24mV x 1   Goal of Therapy:  WNL  Plan:  K 3.3>2.9 Will order KCl 40 meq po every 3 hours x 2 doses Recheck electrolytes 12/6 @ 1400 DixiermD Clinical Pharmacist 04/08/2022 7:27 AM

## 2022-04-09 ENCOUNTER — Telehealth: Payer: Self-pay | Admitting: Internal Medicine

## 2022-04-09 ENCOUNTER — Ambulatory Visit: Payer: PPO

## 2022-04-09 ENCOUNTER — Other Ambulatory Visit (HOSPITAL_COMMUNITY): Payer: Self-pay

## 2022-04-09 LAB — CBC WITH DIFFERENTIAL/PLATELET
Abs Immature Granulocytes: 0 10*3/uL (ref 0.00–0.07)
Basophils Absolute: 0 10*3/uL (ref 0.0–0.1)
Basophils Relative: 0 %
Eosinophils Absolute: 0 10*3/uL (ref 0.0–0.5)
Eosinophils Relative: 3 %
HCT: 28.2 % — ABNORMAL LOW (ref 36.0–46.0)
Hemoglobin: 9.6 g/dL — ABNORMAL LOW (ref 12.0–15.0)
Immature Granulocytes: 0 %
Lymphocytes Relative: 56 %
Lymphs Abs: 0.2 10*3/uL — ABNORMAL LOW (ref 0.7–4.0)
MCH: 29.5 pg (ref 26.0–34.0)
MCHC: 34 g/dL (ref 30.0–36.0)
MCV: 86.8 fL (ref 80.0–100.0)
Monocytes Absolute: 0 10*3/uL — ABNORMAL LOW (ref 0.1–1.0)
Monocytes Relative: 6 %
Neutro Abs: 0.1 10*3/uL — CL (ref 1.7–7.7)
Neutrophils Relative %: 35 %
Platelets: 48 10*3/uL — ABNORMAL LOW (ref 150–400)
RBC: 3.25 MIL/uL — ABNORMAL LOW (ref 3.87–5.11)
RDW: 15.1 % (ref 11.5–15.5)
Smear Review: NORMAL
WBC: 0.3 10*3/uL — CL (ref 4.0–10.5)
nRBC: 0 % (ref 0.0–0.2)

## 2022-04-09 LAB — BASIC METABOLIC PANEL
Anion gap: 5 (ref 5–15)
BUN: 8 mg/dL (ref 8–23)
CO2: 21 mmol/L — ABNORMAL LOW (ref 22–32)
Calcium: 8.1 mg/dL — ABNORMAL LOW (ref 8.9–10.3)
Chloride: 114 mmol/L — ABNORMAL HIGH (ref 98–111)
Creatinine, Ser: 0.57 mg/dL (ref 0.44–1.00)
GFR, Estimated: >60 mL/min (ref >60)
Glucose, Bld: 102 mg/dL — ABNORMAL HIGH (ref 70–99)
Potassium: 4 mmol/L (ref 3.5–5.1)
Sodium: 140 mmol/L (ref 135–145)

## 2022-04-09 LAB — MAGNESIUM: Magnesium: 1.9 mg/dL (ref 1.7–2.4)

## 2022-04-09 LAB — CULTURE, BLOOD (ROUTINE X 2)
Culture: NO GROWTH
Special Requests: ADEQUATE

## 2022-04-09 LAB — PHOSPHORUS: Phosphorus: 2.9 mg/dL (ref 2.5–4.6)

## 2022-04-09 NOTE — Plan of Care (Signed)
  Problem: Clinical Measurements: Goal: Ability to maintain clinical measurements within normal limits will improve Outcome: Progressing   Problem: Activity: Goal: Risk for activity intolerance will decrease Outcome: Progressing   Problem: Coping: Goal: Level of anxiety will decrease Outcome: Progressing   Problem: Pain Managment: Goal: General experience of comfort will improve Outcome: Progressing   

## 2022-04-09 NOTE — Telephone Encounter (Signed)
F/u on symptoms and TCM needed.

## 2022-04-09 NOTE — Consult Note (Signed)
PHARMACY CONSULT NOTE - FOLLOW UP  Pharmacy Consult for Electrolyte Monitoring and Replacement   Recent Labs: Potassium (mmol/L)  Date Value  04/09/2022 4.0  11/30/2013 3.5   Magnesium (mg/dL)  Date Value  04/09/2022 1.9   Calcium (mg/dL)  Date Value  04/09/2022 8.1 (L)   Calcium, Total (mg/dL)  Date Value  11/30/2013 8.7   Albumin (g/dL)  Date Value  04/02/2022 3.1 (L)  11/30/2013 3.3 (L)   Phosphorus (mg/dL)  Date Value  04/09/2022 2.9   Sodium (mmol/L)  Date Value  04/09/2022 140  05/09/2018 143  11/30/2013 145     Assessment: 71 yo female presneted with AMS, diarrhea, and fever.  Patient tested positive for influenza A.  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  Patient was also found to be hypokalemic, which has not resolved after multiple replacement orders.  12/2: 79mq PO KCL + 244m from IV KPhos + 10 mEq IV KCL 12/3: 60 meq Kcl IV 12/4: KCL 4023mPO x2; followed by add'l 37m32mO x3 12/5: Kphos 15mm56mV x 1 12/6: Kcl 40 meq po x 3   Goal of Therapy:  WNL  Plan:  K = 4.0, Phos = 2.9, Mag = 1.9 No replacement needed today Recheck labs 12/8   HowarLorin PicketrmD Clinical Pharmacist 04/09/2022 7:33 AM

## 2022-04-09 NOTE — Discharge Summary (Signed)
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 Follow up LABS/TEST:  CBC with differential in 1 to 2 weeks.    Follow-up Information     [name], MD Follow up.   Specialty: Internal Medicine Contact information: [address] [place] [address] [phone]                Diet recommendation: Cardiac diet  Activity: The patient is advised to gradually reintroduce usual activities, as tolerated  Discharge Condition: stable  Code Status: Full code   History of present illness: As per the H and P dictated on admission, Hospital Course:  Debra Hale is a 71 y.o. 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  She was recently hospitalized from 11/28 - 11/30 due to right flank pain with hydronephrosis, no obstructive stone, likely recently passed stone.  Patient was treated with antibiotics for  complicated UTI.  12/02:  in ED, she is mildly confused, lethargic, with slow responsiveness, but no focal deficit. She was orientated x 3. (+) pancytopenia with positive Flu A, WBC 0.5, hemoglobin 8.9, platelet 53 (recent baseline WBC 0.7, hemoglobin 7.0, platelet 58 on 04/02/2022), UA(+) blood but no UTI. Cdiff and GI panel neg. COVID PCR negative, GFR> 60, potassium 2.5, magnesium 2.0.  Temperature up to 102.7, blood pressure 130/51, heart rate 93, RR 21, SpO2 94% on room air. Chest x-ray mild interstitial pulmonary edema.  CT head negative.  Patient is admitted to telemetry bed as inpatient. Message sent to Dr. Rogue Bussing of oncology for consult.  Further management as below.  # Severe sepsis due to Influenza A:  Patient has positive flu A PCR.  RULED OUT: UTI, bacteremia, CDiff and other GI infection. meets criteria for severe sepsis on admission with WBC 0.5, heart rate 93, RR 21, fever 102.7.  Lactic acid 2.3, 2.0. s/p Tamiflu 75 mg twice daily x 5 days. tylenol prn for fever, remained afebrile and discharged home. # Pancytopenia Hgb dropped to 6.8, 1 unit PRBC, Hb 9.6 today.  Heme-onc consulted.  Recommended no further intervention.   # Neutropenia with fever: pt has Flu A, which is likely the source of her fever Cannot completely rule out bacterial infection due to neutropenia. Blood culture NG x2d, Urine culture NG, GI PCR neg. S/p Vanco and cefepime and given blood cultures negative so antibiotics were discontinued.  Patient was kept on neutropenic precautions.  Patient was seen by heme-onc, recommended no intervention and follow-up as an outpatient. # Acute metabolic encephalopathy - improved and patient  is back to her baseline. It was likely due to multifactorial etiology, including flu A, electrolytes disturbance and severe sepsis. CT head negative. # Diarrhea: This is chronic issue. negative C diff and GI path panel # AML (acute myelogenous leukemia): pt is following up with Dr. [name] of  oncology. Recent bone marrow biopsy revealed mildly progressive disease with her total blast count of approximately 15%.  Up from 5% in March. Patient was scheduled to start treatment with Vidaza and venetoclax this week. Cancel treatment this week and will rediscuss options on Monday December to 11, 2023.  # Hypokalemia and phosphatemia: Repleted potassium and phosphorus. Resolved  # Chronic diastolic CHF (congestive heart failure):  2D echo on 09/05/2020 showed EF acetic acid 5% with grade 1 diastolic dysfunction.  BNP 369, chest x-ray showed mild interstitial edema, but no leg edema or JVD.  No shortness of breath.  Does not seem to have acute CHF exacerbation, resumed home dose Lasix. # Hypothyroidism, Synthroid # Depression with anxiety, Continue home medications  Body mass index is 23.08 kg/m.  Nutrition Interventions:    Patient was ambulatory without any assistance. On the day of the discharge the patient's vitals were stable, and no other acute medical condition were reported by patient. the patient was felt safe to be discharge at Home.  Consultants: Heme-onc Procedures: None  Discharge Exam: General: Appear in no distress, no Rash; Oral Mucosa Clear, moist. Cardiovascular: S1 and S2 Present, no Murmur, Respiratory: normal respiratory effort, Bilateral Air entry present and no Crackles, no wheezes Abdomen: Bowel Sound present, Soft and no tenderness, no hernia Extremities: no Pedal edema, no calf tenderness Neurology: alert and oriented to time, place, and person affect appropriate.  Filed Weights   04/04/22 1037  Weight: 61 kg   Vitals:   04/09/22 0610 04/09/22 0700  BP: 139/66 132/70  Pulse: 75   Resp: 16 16  Temp: 98.9 F (37.2 C) 98.8 F (37.1 C)  SpO2: 93% 94%    DISCHARGE MEDICATION: Allergies as of 04/09/2022       Reactions   Demeclocycline Other (See Comments)   Throat swells   Sulfa Antibiotics Other (See Comments)   Other reaction(s): Other (See  Comments) Throat swells Other reaction(s): Other (See Comments) Throat swells Throat swells Other reaction(s): Other (See Comments) Throat swells Other reaction(s): Unknown Other reaction(s): Other (See Comments) Throat swells Other reaction(s): Other (See Comments) Other reaction(s): Other (See Comments) Throat swellsOther reaction(s): Other (See Comments) Throat swells   Tetracyclines & Related Other (See Comments)   Throat swells   Levofloxacin Diarrhea   Severe diarrhea   Augmentin [amoxicillin-pot Clavulanate] Diarrhea   Bentyl [dicyclomine Hcl]    unkn   Ciprofloxacin Diarrhea   Codeine Other (See Comments)   dizziness   Dicyclomine Hcl Other (See Comments)   unkn   Epinephrine    Speeds heart up - panic    Flagyl [metronidazole] Nausea And Vomiting   Librax [chlordiazepoxide-clidinium]    unkn   Novocain [procaine] Other (See Comments)   "Shaky"   Phenobarbital    unkn   Prednisone    Nervous    Ultram [tramadol] Other (See Comments)   Sick feeling        Medication List     TAKE these medications    Azelastine-Fluticasone 137-50 MCG/ACT Susp SPRAY 2 SPRAYS INTO EACH NOSTRIL EVERY DAY   clonazePAM 0.5 MG tablet Commonly known as: KLONOPIN Take 1 tablet (0.5 mg total) by mouth 3 (three) times daily as  needed for anxiety.   diphenoxylate-atropine 2.5-0.025 MG tablet Commonly known as: LOMOTIL Take 1 tablet by mouth 4 (four) times daily as needed for diarrhea or loose stools.   levothyroxine 125 MCG tablet Commonly known as: SYNTHROID TAKE 1 TABLET EVERY DAY ON EMPTY STOMACHWITH A GLASS OF WATER AT LEAST 30-60 MINBEFORE BREAKFAST   Lexapro 20 MG tablet Generic drug: escitalopram Take 20 mg by mouth every morning.   loperamide 2 MG capsule Commonly known as: IMODIUM Take by mouth.   metoprolol tartrate 25 MG tablet Commonly known as: LOPRESSOR TAKE 1 TABLET (25 MG TOTAL) BY MOUTH AS NEEDED (AS NEEDED UP TO TWICE DAILY FOR PALPITATIONS).    nitrofurantoin (macrocrystal-monohydrate) 100 MG capsule Commonly known as: Macrobid Take 1 capsule (100 mg total) by mouth 2 (two) times daily.   pantoprazole 40 MG tablet Commonly known as: PROTONIX TAKE ONE TABLET (40 MG) BY MOUTH EVERY DAY   potassium chloride SA 20 MEQ tablet Commonly known as: KLOR-CON M Take 20 mEq by mouth daily.   Venclexta 100 MG tablet Generic drug: venetoclax Take 4 tablets (400 mg total) by mouth daily. Take for 14 days, then hold for 14 days. Repeat every 28 days. Take with a meal and a full glass of water.       Allergies  Allergen Reactions   Demeclocycline Other (See Comments)    Throat swells   Sulfa Antibiotics Other (See Comments)    Other reaction(s): Other (See Comments) Throat swells Other reaction(s): Other (See Comments) Throat swells Throat swells Other reaction(s): Other (See Comments) Throat swells Other reaction(s): Unknown Other reaction(s): Other (See Comments) Throat swells Other reaction(s): Other (See Comments) Other reaction(s): Other (See Comments) Throat swellsOther reaction(s): Other (See Comments) Throat swells   Tetracyclines & Related Other (See Comments)    Throat swells    Levofloxacin Diarrhea    Severe diarrhea   Augmentin [Amoxicillin-Pot Clavulanate] Diarrhea   Bentyl [Dicyclomine Hcl]     unkn   Ciprofloxacin Diarrhea   Codeine Other (See Comments)    dizziness     Dicyclomine Hcl Other (See Comments)    unkn   Epinephrine     Speeds heart up - panic    Flagyl [Metronidazole] Nausea And Vomiting   Librax [Chlordiazepoxide-Clidinium]     unkn   Novocain [Procaine] Other (See Comments)    "Shaky"   Phenobarbital     unkn   Prednisone     Nervous     Ultram [Tramadol] Other (See Comments)    Sick feeling   Discharge Instructions     Call MD for:  difficulty breathing, headache or visual disturbances   Complete by: As directed    Call MD for:  extreme fatigue   Complete by: As  directed    Call MD for:  persistant dizziness or light-headedness   Complete by: As directed    Call MD for:  persistant nausea and vomiting   Complete by: As directed    Call MD for:  severe uncontrolled pain   Complete by: As directed    Call MD for:  temperature >100.4   Complete by: As directed    Diet - low sodium heart healthy   Complete by: As directed    Discharge instructions   Complete by: As directed    Follow-up with PCP in 1 week Follow-up with heme-onc in 1 week.  May need to repeat CBC with differential in 1 to 2 weeks.   Increase activity slowly  Complete by: As directed        The results of significant diagnostics from this hospitalization (including imaging, microbiology, ancillary and laboratory) are listed below for reference.    Significant Diagnostic Studies: DG Chest Portable 1 View  Result Date: 04/04/2022 CLINICAL DATA:  Confusion and altered mental status this morning. EXAM: PORTABLE CHEST 1 VIEW COMPARISON:  November 15, 2020 FINDINGS: The mediastinal contour is normal. The heart size is mildly enlarged. Increased central pulmonary vessel caliber noted. There is no focal pneumonia, pulmonary edema or pleural effusion. The visualized skeletal structures are stable. IMPRESSION: Mild congestive heart failure. Electronically Signed   By: Abelardo Diesel M.D.   On: 04/04/2022 12:03   CT HEAD WO CONTRAST (5MM)  Result Date: 04/04/2022 CLINICAL DATA:  Mental status change with unknown cause EXAM: CT HEAD WITHOUT CONTRAST TECHNIQUE: Contiguous axial images were obtained from the base of the skull through the vertex without intravenous contrast. RADIATION DOSE REDUCTION: This exam was performed according to the departmental dose-optimization program which includes automated exposure control, adjustment of the mA and/or kV according to patient size and/or use of iterative reconstruction technique. COMPARISON:  04/29/2021 FINDINGS: Brain: No evidence of acute infarction,  hemorrhage, hydrocephalus, extra-axial collection or mass lesion/mass effect. Vascular: No hyperdense vessel or unexpected calcification. Skull: Normal. Negative for fracture or focal lesion. Sinuses/Orbits: Extensive opacification of the right maxillary sinus, chronic when compared to prior and with sclerotic wall thickening. Nonspecific calcification lateral to the left pterygoid body, stable and non worrisome. IMPRESSION: 1. No acute or interval finding. 2. Chronic, focal right maxillary sinusitis. Electronically Signed   By: Jorje Guild M.D.   On: 04/04/2022 11:11   CT Renal Stone Study  Result Date: 03/31/2022 CLINICAL DATA:  Abdominal/flank pain, stone suspected EXAM: CT ABDOMEN AND PELVIS WITHOUT CONTRAST TECHNIQUE: Multidetector CT imaging of the abdomen and pelvis was performed following the standard protocol without IV contrast. RADIATION DOSE REDUCTION: This exam was performed according to the departmental dose-optimization program which includes automated exposure control, adjustment of the mA and/or kV according to patient size and/or use of iterative reconstruction technique. COMPARISON:  06/16/2020 FINDINGS: Lower chest: No pleural or pericardial effusion. Hepatobiliary: 2.3 cm low-attenuation lesion in hepatic segment 2 (Im18,Se4), in retrospect possibly present as a region of of a enhancement 1.7 cm on 07/21/2017 suggesting hemangioma although nonspecific. No other liver lesions. Cholecystectomy clips. Pancreas: Unremarkable. No pancreatic ductal dilatation or surrounding inflammatory changes. Spleen: Normal in size without focal abnormality. Adrenals/Urinary Tract: No adrenal mass. Bilateral urolithiasis, largest stone in the left upper pole 8 mm, on the right 6 mm. There is new right hydronephrosis and ureterectasis down to the level of the ureteral orifice without visible obstructing Calculus.urinary bladder is physiologically distended. Stomach/Bowel: Small hiatal hernia. Stomach  incompletely distended. Small bowel decompressed. Changes of right hemicolectomy with staple line in the mid abdomen. Remaining left colon unremarkable. Vascular/Lymphatic: Mild scattered aortoiliac calcified plaque. No abdominal or pelvic adenopathy. Reproductive: Uterus and bilateral adnexa are unremarkable. Other: Right pelvic phleboliths.  No ascites.  No free air. Musculoskeletal: Right hip arthroplasty projects in expected location, incompletely visualized. Mild left hip DJD. No acute findings. IMPRESSION: 1. New right hydronephrosis and ureterectasis down to the level of the ureteral orifice without visible obstructing Calculus. 2. Bilateral nephrolithiasis. 3. 2.3 cm low-attenuation lesion in hepatic segment 2, possibly hemangioma but nonspecific. Consider elective outpatient MR liver protocol with contrast for further characterization. 4. Small hiatal hernia. Aortic Atherosclerosis (ICD10-I70.0). Electronically Signed   By: Keturah Barre  Vernard Gambles M.D.   On: 03/31/2022 17:19   CT BONE MARROW BIOPSY & ASPIRATION  Result Date: 03/23/2022 INDICATION: 71 year old female with history of AML. EXAM: CT-GUIDED BONE MARROW BIOPSY AND ASPIRATION MEDICATIONS: None ANESTHESIA/SEDATION: Fentanyl 100 mcg IV; Versed 2 mg IV Sedation Time: 10 minutes; The patient was continuously monitored during the procedure by the interventional radiology nurse under my direct supervision. COMPLICATIONS: None immediate. PROCEDURE: Informed consent was obtained from the patient following an explanation of the procedure, risks, benefits and alternatives. The patient understands, agrees and consents for the procedure. All questions were addressed. A time out was performed prior to the initiation of the procedure. The patient was positioned prone and non-contrast localization CT was performed of the pelvis to demonstrate the iliac marrow spaces. The operative site was prepped and draped in the usual sterile fashion. Under sterile conditions and  local anesthesia, a 22 gauge spinal needle was utilized for procedural planning. Next, an 11 gauge coaxial bone biopsy needle was advanced into the right iliac marrow space. Needle position was confirmed with CT imaging. Initially, a bone marrow aspiration was performed. Next, a bone marrow biopsy was obtained with the 11 gauge outer bone marrow device. Samples were prepared with the cytotechnologist and deemed adequate. The needle was removed and superficial hemostasis was obtained with manual compression. A dressing was applied. The patient tolerated the procedure well without immediate post procedural complication. IMPRESSION: Successful CT guided right iliac bone marrow aspiration and core biopsy. Ruthann Cancer, MD [facility] Electronically Signed   By: Ruthann Cancer M.D.   On: 03/23/2022 09:50    Microbiology: Recent Results (from the past 240 hour(s))  Culture, blood (Routine X 2) w Reflex to ID Panel     Status: Abnormal   Collection Time: 04/01/22 10:20 AM   Specimen: BLOOD  Result Value Ref Range Status   Specimen Description   Final    BLOOD RIGHT ANTECUBITAL Performed at [facility], [address], [place], [place] [address]    Special Requests   Final    BOTTLES DRAWN AEROBIC AND ANAEROBIC Blood Culture results may not be optimal due to an excessive volume of blood received in culture bottles Performed at [facility], [address], [place], [place] [address]    Culture  Setup Time   Final    GRAM POSITIVE COCCI ANAEROBIC BOTTLE ONLY CRITICAL RESULT CALLED TO, READ BACK BY AND VERIFIED WITH: Rito Ehrlich PHARMD 1334 04/02/22 HNM    Culture (A)  Final    STAPHYLOCOCCUS CAPITIS THE SIGNIFICANCE OF ISOLATING THIS ORGANISM FROM A SINGLE SET OF BLOOD CULTURES WHEN MULTIPLE SETS ARE DRAWN IS UNCERTAIN. PLEASE NOTIFY THE MICROBIOLOGY DEPARTMENT WITHIN ONE WEEK IF SPECIATION AND SENSITIVITIES ARE  REQUIRED. Performed at [facility], [place] [address], [place], [place] [address]    Report Status 04/04/2022 FINAL  Final  Blood Culture ID Panel (Reflexed)     Status: Abnormal   Collection Time: 04/01/22 10:20 AM  Result Value Ref Range Status   Enterococcus faecalis NOT DETECTED NOT DETECTED Final   Enterococcus Faecium NOT DETECTED NOT DETECTED Final   Listeria monocytogenes NOT DETECTED NOT DETECTED Final   Staphylococcus species DETECTED (A) NOT DETECTED Final    Comment: CRITICAL RESULT CALLED TO, READ BACK BY AND VERIFIED WITH: Rito Ehrlich PHARMD 1334 04/02/22 HNM    Staphylococcus aureus (BCID) NOT DETECTED NOT DETECTED Final   Staphylococcus epidermidis NOT DETECTED NOT DETECTED Final  Staphylococcus lugdunensis NOT DETECTED NOT DETECTED Final   Streptococcus species NOT DETECTED NOT DETECTED Final   Streptococcus agalactiae NOT DETECTED NOT DETECTED Final   Streptococcus pneumoniae NOT DETECTED NOT DETECTED Final   Streptococcus pyogenes NOT DETECTED NOT DETECTED Final   A.calcoaceticus-baumannii NOT DETECTED NOT DETECTED Final   Bacteroides fragilis NOT DETECTED NOT DETECTED Final   Enterobacterales NOT DETECTED NOT DETECTED Final   Enterobacter cloacae complex NOT DETECTED NOT DETECTED Final   Escherichia coli NOT DETECTED NOT DETECTED Final   Klebsiella aerogenes NOT DETECTED NOT DETECTED Final   Klebsiella oxytoca NOT DETECTED NOT DETECTED Final   Klebsiella pneumoniae NOT DETECTED NOT DETECTED Final   Proteus species NOT DETECTED NOT DETECTED Final   Salmonella species NOT DETECTED NOT DETECTED Final   Serratia marcescens NOT DETECTED NOT DETECTED Final   Haemophilus influenzae NOT DETECTED NOT DETECTED Final   Neisseria meningitidis NOT DETECTED NOT DETECTED Final   Pseudomonas aeruginosa NOT DETECTED NOT DETECTED Final   Stenotrophomonas maltophilia NOT DETECTED NOT DETECTED Final   Candida albicans NOT DETECTED NOT DETECTED Final   Candida auris NOT  DETECTED NOT DETECTED Final   Candida glabrata NOT DETECTED NOT DETECTED Final   Candida krusei NOT DETECTED NOT DETECTED Final   Candida parapsilosis NOT DETECTED NOT DETECTED Final   Candida tropicalis NOT DETECTED NOT DETECTED Final   Cryptococcus neoformans/gattii NOT DETECTED NOT DETECTED Final    Comment: Performed at [facility], [address] [place], [place] [address]  Culture, blood (Routine X 2) w Reflex to ID Panel     Status: None   Collection Time: 04/01/22 10:25 AM   Specimen: BLOOD  Result Value Ref Range Status   Specimen Description BLOOD BLOOD LEFT ARM  Final   Special Requests   Final    BOTTLES DRAWN AEROBIC AND ANAEROBIC Blood Culture results may not be optimal due to an excessive volume of blood received in culture bottles   Culture   Final    NO GROWTH 5 DAYS Performed at [facility], [address] [place], [place] [address]    Report Status 04/06/2022 FINAL  Final  Urine Culture     Status: Abnormal   Collection Time: 04/01/22 10:50 AM   Specimen: Urine, Random  Result Value Ref Range Status   Specimen Description   Final    URINE, RANDOM Performed at [facility], [address], [place], [place] [address]    Special Requests   Final    NONE Performed at [facility], [address] [place],  [address]    Culture MULTIPLE SPECIES PRESENT, SUGGEST RECOLLECTION (A)  Final   Report Status 04/02/2022 FINAL  Final  Resp Panel by RT-PCR (Flu A&B, Covid) Anterior Nasal Swab     Status: Abnormal   Collection Time: 04/04/22 12:07 PM   Specimen: Anterior Nasal Swab  Result Value Ref Range Status   SARS Coronavirus 2 by RT PCR NEGATIVE NEGATIVE Final    Comment: (NOTE) SARS-CoV-2 target nucleic acids are NOT DETECTED.  The SARS-CoV-2 RNA is generally detectable in upper respiratory specimens during the acute phase of infection. The lowest concentration of SARS-CoV-2 viral copies this assay can detect  is 138 copies/mL. A negative result does not preclude SARS-Cov-2 infection and should not be used as the sole basis for treatment or other patient management decisions. A negative result may occur with  improper specimen collection/handling, submission of specimen other than nasopharyngeal swab, presence of viral mutation(s) within  the areas targeted by this assay, and inadequate number of viral copies(<138 copies/mL). A negative result must be combined with clinical observations, patient history, and epidemiological information. The expected result is Negative.  Fact Sheet for Patients:  EntrepreneurPulse.com.au  Fact Sheet for Healthcare Providers:  IncredibleEmployment.be  This test is no t yet approved or cleared by the [place] FDA and  has been authorized for detection and/or diagnosis of SARS-CoV-2 by FDA under an Emergency Use Authorization (EUA). This EUA will remain  in effect (meaning this test can be used) for the duration of the COVID-19 declaration under Section 564(b)(1) of the Act, 21 U.S.C.section 360bbb-3(b)(1), unless the authorization is terminated  or revoked sooner.       Influenza A by PCR POSITIVE (A) NEGATIVE Final   Influenza B by PCR NEGATIVE NEGATIVE Final    Comment: (NOTE) The Xpert Xpress SARS-CoV-2/FLU/RSV plus assay is intended as an aid in the diagnosis of influenza from Nasopharyngeal swab specimens and should not be used as a sole basis for treatment. Nasal washings and aspirates are unacceptable for Xpert Xpress SARS-CoV-2/FLU/RSV testing.  Fact Sheet for Patients: EntrepreneurPulse.com.au  Fact Sheet for Healthcare Providers: IncredibleEmployment.be  This test is not yet approved or cleared by the [place] FDA and has been authorized for detection and/or diagnosis of SARS-CoV-2 by FDA under an Emergency Use Authorization (EUA). This EUA will remain in  effect (meaning this test can be used) for the duration of the COVID-19 declaration under Section 564(b)(1) of the Act, 21 U.S.C. section 360bbb-3(b)(1), unless the authorization is terminated or revoked.  Performed at [facility], [address] [place], [place] [address]   Culture, blood (x 2)     Status: None   Collection Time: 04/04/22  3:01 PM   Specimen: BLOOD  Result Value Ref Range Status   Specimen Description BLOOD RIGHT HAND  Final   Special Requests   Final    BOTTLES DRAWN AEROBIC AND ANAEROBIC Blood Culture adequate volume   Culture   Final    NO GROWTH 5 DAYS Performed at [facility], [address] [place], [place] [address]    Report Status 04/09/2022 FINAL  Final  Culture, blood (x 2)     Status: Abnormal   Collection Time: 04/04/22  3:01 PM   Specimen: BLOOD  Result Value Ref Range Status   Specimen Description   Final    BLOOD LEFT ARM Performed at [facility], [address], [place], [place] [id number]    Special Requests   Final    BOTTLES DRAWN AEROBIC AND ANAEROBIC Blood Culture adequate volume Performed at [facility], [address] [place], [place] [address]    Culture  Setup Time   Final    GRAM POSITIVE COCCI AEROBIC BOTTLE ONLY CRITICAL RESULT CALLED TO, READ BACK BY AND VERIFIED WITH: JASON ROBBINS 04/07/2022 AT 0354 SRR    Culture (A)  Final    STAPHYLOCOCCUS EPIDERMIDIS THE SIGNIFICANCE OF ISOLATING THIS ORGANISM FROM A SINGLE SET OF BLOOD CULTURES WHEN MULTIPLE SETS ARE DRAWN IS UNCERTAIN. PLEASE NOTIFY THE MICROBIOLOGY DEPARTMENT WITHIN ONE WEEK IF SPECIATION AND SENSITIVITIES ARE REQUIRED. Performed at [facility], [place] [address], [place], [place] [address]    Report Status 04/08/2022 FINAL  Final  Blood Culture ID Panel (Reflexed)     Status: Abnormal   Collection Time: 04/04/22  3:01 PM  Result Value Ref Range Status   Enterococcus faecalis NOT DETECTED NOT DETECTED Final  Enterococcus Faecium NOT DETECTED NOT DETECTED Final   Listeria monocytogenes NOT DETECTED NOT DETECTED Final   Staphylococcus species DETECTED (A) NOT DETECTED Final    Comment: CRITICAL RESULT CALLED TO, READ BACK BY AND VERIFIED WITH: JASON ROBBINS 04/07/2022 AT 0354 SRR    Staphylococcus aureus (BCID) NOT DETECTED NOT DETECTED Final   Staphylococcus epidermidis DETECTED (A) NOT DETECTED Final    Comment: CRITICAL RESULT CALLED TO, READ BACK BY AND VERIFIED WITH: JASON ROBBINS 04/07/2022 AT 0354 SRR    Staphylococcus lugdunensis NOT DETECTED NOT DETECTED Final   Streptococcus species NOT DETECTED NOT DETECTED Final   Streptococcus agalactiae NOT DETECTED NOT DETECTED Final   Streptococcus pneumoniae NOT DETECTED NOT DETECTED Final   Streptococcus pyogenes NOT DETECTED NOT DETECTED Final   A.calcoaceticus-baumannii NOT DETECTED NOT DETECTED Final   Bacteroides fragilis NOT DETECTED NOT DETECTED Final   Enterobacterales NOT DETECTED NOT DETECTED Final   Enterobacter cloacae complex NOT DETECTED NOT DETECTED Final   Escherichia coli NOT DETECTED NOT DETECTED Final   Klebsiella aerogenes NOT DETECTED NOT DETECTED Final   Klebsiella oxytoca NOT DETECTED NOT DETECTED Final   Klebsiella pneumoniae NOT DETECTED NOT DETECTED Final   Proteus species NOT DETECTED NOT DETECTED Final   Salmonella species NOT DETECTED NOT DETECTED Final   Serratia marcescens NOT DETECTED NOT DETECTED Final   Haemophilus influenzae NOT DETECTED NOT DETECTED Final   Neisseria meningitidis NOT DETECTED NOT DETECTED Final   Pseudomonas aeruginosa NOT DETECTED NOT DETECTED Final   Stenotrophomonas maltophilia NOT DETECTED NOT DETECTED Final   Candida albicans NOT DETECTED NOT DETECTED Final   Candida auris NOT DETECTED NOT DETECTED Final   Candida glabrata NOT DETECTED NOT DETECTED Final   Candida krusei NOT DETECTED NOT DETECTED Final   Candida parapsilosis NOT DETECTED NOT DETECTED Final   Candida tropicalis NOT  DETECTED NOT DETECTED Final   Cryptococcus neoformans/gattii NOT DETECTED NOT DETECTED Final   Methicillin resistance mecA/C NOT DETECTED NOT DETECTED Final    Comment: Performed at [facility], [address] [place], [place] [address]  Urine Culture     Status: None   Collection Time: 04/04/22 11:51 PM   Specimen: Urine, Clean Catch  Result Value Ref Range Status   Specimen Description   Final    URINE, CLEAN CATCH Performed at [facility], [address], [place], [place] [address]    Special Requests   Final    NONE Performed at [facility], [address] [place], [place] [address]    Culture   Final    NO GROWTH Performed at [facility], [address], [place], [place] [address]    Report Status 04/06/2022 FINAL  Final  C Difficile Quick Screen w PCR reflex     Status: None   Collection Time: 04/04/22 11:59 PM   Specimen: Urine, Clean Catch; Stool  Result Value Ref Range Status   C Diff antigen NEGATIVE NEGATIVE Final   C Diff toxin NEGATIVE NEGATIVE Final   C Diff interpretation No C. difficile detected.  Final    Comment: Performed at [facility], [address] [place], [place] [address]  Gastrointestinal Panel by PCR , Stool     Status: None   Collection Time: 04/04/22 11:59 PM   Specimen: Urine, Clean Catch; Stool  Result Value Ref Range Status   Campylobacter species NOT DETECTED NOT DETECTED Final   Plesimonas shigelloides NOT DETECTED NOT DETECTED Final   Salmonella species NOT DETECTED  NOT DETECTED Final   Yersinia enterocolitica NOT DETECTED NOT DETECTED Final   Vibrio species NOT DETECTED NOT DETECTED Final   Vibrio cholerae NOT DETECTED NOT DETECTED Final   Enteroaggregative E coli (EAEC) NOT DETECTED NOT DETECTED Final   Enteropathogenic E coli (EPEC) NOT DETECTED NOT DETECTED Final   Enterotoxigenic E coli (ETEC) NOT DETECTED NOT DETECTED Final   Shiga like toxin producing E coli (STEC) NOT DETECTED  NOT DETECTED Final   Shigella/Enteroinvasive E coli (EIEC) NOT DETECTED NOT DETECTED Final   Cryptosporidium NOT DETECTED NOT DETECTED Final   Cyclospora cayetanensis NOT DETECTED NOT DETECTED Final   Entamoeba histolytica NOT DETECTED NOT DETECTED Final   Giardia lamblia NOT DETECTED NOT DETECTED Final   Adenovirus F40/41 NOT DETECTED NOT DETECTED Final   Astrovirus NOT DETECTED NOT DETECTED Final   Norovirus GI/GII NOT DETECTED NOT DETECTED Final   Rotavirus A NOT DETECTED NOT DETECTED Final   Sapovirus (I, II, IV, and V) NOT DETECTED NOT DETECTED Final    Comment: Performed at [facility], [address] [place], [place] [address]     Labs: CBC: Recent Labs  Lab 04/04/22 1050 04/05/22 0753 04/06/22 0422 04/06/22 2316 04/07/22 0605 04/08/22 0728 04/09/22 0519  WBC 0.5*  0.5* 0.4* 0.3*  --  0.3* 0.3* 0.3*  NEUTROABS 0.3*  --   --   --   --  0.1* 0.1*  HGB 8.9*  9.1* 7.8* 6.8* 10.6* 9.4* 9.5* 9.6*  HCT 26.2*  26.6* 23.3* 20.1* 30.5* 27.3* 27.3* 28.2*  MCV 88.8  89.3 90.7 88.2  --  86.4 85.6 86.8  PLT 54*  55* 40* 36*  --  40* 43* 48*   Basic Metabolic Panel: Recent Labs  Lab 04/04/22 1050 04/04/22 1206 04/05/22 0753 04/05/22 1453 04/06/22 0422 04/06/22 1625 04/07/22 0605 04/08/22 0500 04/08/22 0728 04/08/22 1413 04/09/22 0519  NA 136  --  139  --  137  --  139  --  138  --  140  K 2.5*  --  2.7*   < > 2.6* 2.9* 3.3*  --  2.9* 3.1* 4.0  CL 103  --  113*  --  112*  --  112*  --  111  --  114*  CO2 23  --  19*  --  19*  --  20*  --  22  --  21*  GLUCOSE 136*  --  115*  --  96  --  117*  --  107*  --  102*  BUN 14  --  12  --  7*  --  7*  --  7*  --  8  CREATININE 0.82  --  0.82  --  0.73  --  0.62  --  0.63  --  0.57  CALCIUM 8.2*  --  7.9*  --  7.7*  --  7.7*  --  7.7*  --  8.1*  MG  --  1.7  --   --   --  1.6* 2.3 1.9  --   --  1.9  PHOS 2.4*  --  3.1  --   --   --  1.9* 3.4  --   --  2.9   < > = values in this interval not displayed.   Liver  Function Tests: No results for input(s): "AST", "ALT", "ALKPHOS", "BILITOT", "PROT", "ALBUMIN" in the last 168 hours. No results for input(s): "LIPASE", "AMYLASE" in the last 168 hours. No results for input(s): "AMMONIA"  in the last 168 hours. Cardiac Enzymes: No results for input(s): "CKTOTAL", "CKMB", "CKMBINDEX", "TROPONINI" in the last 168 hours. BNP (last 3 results) Recent Labs    04/04/22 1050  BNP 369.6*   CBG: No results for input(s): "GLUCAP" in the last 168 hours.  Time spent: 35 minutes  Signed:  Val Riles  Debra Hale  04/09/2022 12:27 PM

## 2022-04-09 NOTE — Progress Notes (Signed)
Discharge instructions reviewed with patient including followup visits.  Understanding was verbalized and all questions were answered.  IV removed without complication; patient tolerated well.  Awaiting ride.

## 2022-04-09 NOTE — Telephone Encounter (Signed)
Patient is being released from the hospital today and going home. Hospital says that patient needs a 1 week follow up, no available appointments.

## 2022-04-10 ENCOUNTER — Telehealth: Payer: Self-pay

## 2022-04-10 ENCOUNTER — Ambulatory Visit: Payer: PPO

## 2022-04-10 ENCOUNTER — Telehealth: Payer: Self-pay | Admitting: *Deleted

## 2022-04-10 NOTE — Telephone Encounter (Signed)
Patient states she has just been released from the hospital.  We do not currently have availability on Dr. Randell Patient Scott's schedule for the next week.  Please call patient for a TCM.

## 2022-04-10 NOTE — Patient Outreach (Signed)
  Care Coordination [facility] Note Transition Care Management Follow-up Telephone Call Date of discharge and from where: [facility] [phone] How have you been since you were released from the hospital? I feel very weak Any questions or concerns? Yes Patient had concerns about Dr appointments when they are. Items Reviewed: Did the pt receive and understand the discharge instructions provided? No  Medications obtained and verified? Yes  Other? Yes  Per patient she is out of the clonazepam. RN discussed having the pharmacist to call for refill. RN discussed talking with Dr Nicki Reaper at visit Patient stated the Zofran is ineffective when she is taking her chemotherapy. RN instructed patient to talk with Dr [name] regarding the [place]. Any new allergies since your discharge? No  Dietary orders reviewed? No Do you have support at home? Yes   Home Care and Equipment/Supplies: Were home health services ordered? not applicable If so, what is the name of the agency? N/a  Has the agency set up a time to come to the patient's home? not applicable Were any new equipment or medical supplies ordered?  No What is the name of the medical supply agency? N/a Were you able to get the supplies/equipment? not applicable Do you have any questions related to the use of the equipment or supplies? No  Functional Questionnaire: (I = Independent and D = Dependent) ADLs: I  Bathing/Dressing- I  Meal Prep- I  Eating- I  Maintaining continence- I  Transferring/Ambulation- I  Managing Meds- I  Follow up appointments reviewed:  PCP Hospital f/u appt confirmed? No  [facility] f/u appt confirmed? Yes  Dr [name] 04/13/2022 10:45 [name] 04/15/2022 2:00 Are transportation arrangements needed? No  If their condition worsens, is the pt aware to call PCP or go to the Emergency Dept.? Yes Was the patient provided with contact information for the PCP's office or ED? Yes Was to pt encouraged to call back  with questions or concerns? Yes  SDOH assessments and interventions completed:   Yes SDOH Interventions Today    Flowsheet Row Most Recent Value  SDOH Interventions   Food Insecurity Interventions Intervention Not Indicated  Housing Interventions Intervention Not Indicated  Transportation Interventions Intervention Not Indicated       Care Coordination Interventions:  PCP follow up appointment requested   Encounter Outcome:  Pt. Visit Completed    [facility] [place] [phone]

## 2022-04-13 ENCOUNTER — Inpatient Hospital Stay: Payer: PPO

## 2022-04-13 ENCOUNTER — Inpatient Hospital Stay: Payer: PPO | Admitting: Oncology

## 2022-04-15 ENCOUNTER — Ambulatory Visit: Payer: PPO | Attending: Medical | Admitting: Medical

## 2022-04-15 NOTE — Progress Notes (Deleted)
Cardiology Office Note:    Date:  04/15/2022   ID:  Debra Hale, DOB 1951-04-12, MRN [id number]  PCP:  Dale Oberlin, MD  [facility] Cardiologist:  Lorine Bears, MD  [facility] Electrophysiologist:  None   Referring MD: Dale [place], MD   Chief Complaint: 6 month follow-up  History of Present Illness:    Debra Hale is a 71 y.o. female with a hx of recurrent pericarditis, HLD, thyroid carcinoma s/p surgery and XRT, colon cancer s/p laparoscopic right colectomy, AML, nephrolithiasis, anxiety, osteoporosis and breast cancer who presents for 6 month follow-up.   She had a previous treadmill stress test in 2017 that showed no evidence of ischemia with poor exercise tolerance and hypertensive response to exercise. She had had recurrent pericarditis and previous small pericardial effusion since 2017. She was hospitalized in 06/2020 due to perirectal abscess. She was seen in April 2022 for increased palpitations. Echocardiogram in May 2022 showed normal LV with no significant pulmonary hypertension.   She was last seen 10/2021 and was on chemotherapy for acute myeloid leukemia. She was stable from a cardiac standpoint.  Today,   Past Medical History:  Diagnosis Date   Abdominal pain 03/06/2016   Acute pericarditis 05/31/2013   Anxiety    Arthritis    Osteoarthritis   Breast cancer (HCC) 2009   right breast lumpectomy with rad tx   Colon cancer (HCC)    surgery with chemo and rad tx   Complication of anesthesia    GERD (gastroesophageal reflux disease)    Heart palpitations    History of hiatal hernia    History of kidney stones    History of thyroid cancer 09/18/2008   Qualifier: Diagnosis of   By: Denyse Amass, CMA, Carol       Hypothyroidism    Liver disease    Liver nodule    s/p negative biopsy   Malignant neoplasm of thyroid gland (HCC) 2002   s/p surgery and XRT   Osteoporosis    Other and unspecified hyperlipidemia    Palpitations    Personal history of  chemotherapy    Personal history of malignant neoplasm of large intestine    carcinoma - cecum, s/p right laparoscopic colectomy - s/p chemotherapy and XRT   Personal history of radiation therapy    Pneumonia 2019   PONV (postoperative nausea and vomiting)    Pure hypercholesterolemia    Unspecified hereditary and idiopathic peripheral neuropathy     Past Surgical History:  Procedure Laterality Date   APPENDECTOMY  1985   BREAST BIOPSY Right 2009   positive   BREAST BIOPSY Right 2009   negative   BREAST LUMPECTOMY Right 2009   breast cancer   CHOLECYSTECTOMY  1995   COLONOSCOPY     COLONOSCOPY WITH PROPOFOL N/A 10/29/2017   Procedure: COLONOSCOPY WITH PROPOFOL;  Surgeon: Scot Jun, MD;  Location: [facility] ENDOSCOPY;  Service: Endoscopy;  Laterality: N/A;   DILATION AND CURETTAGE OF UTERUS  1990   DILATION AND CURETTAGE, DIAGNOSTIC / THERAPEUTIC  1990   ESOPHAGOGASTRODUODENOSCOPY     ESOPHAGOGASTRODUODENOSCOPY (EGD) WITH PROPOFOL N/A 10/29/2017   Procedure: ESOPHAGOGASTRODUODENOSCOPY (EGD) WITH PROPOFOL;  Surgeon: Scot Jun, MD;  Location: [facility] ENDOSCOPY;  Service: Endoscopy;  Laterality: N/A;   INCISION AND DRAINAGE PERIRECTAL ABSCESS N/A 06/17/2020   Procedure: IRRIGATION AND DEBRIDEMENT PERIRECTAL ABSCESS;  Surgeon: Leafy Ro, MD;  Location: [facility] ORS;  Service: General;  Laterality: N/A;   IR BONE MARROW BIOPSY & ASPIRATION  07/09/2021   LAPAROSCOPIC PARTIAL COLECTOMY     stage 3-C carcinoma of the cecum, s/p chemotherapy and xrt   LITHOTRIPSY     SIGMOIDOSCOPY  08/26/1993   THYROID LOBECTOMY  2002   s/p XRT   TOTAL HIP ARTHROPLASTY Right 10/24/2019   Procedure: TOTAL HIP ARTHROPLASTY;  Surgeon: Christena Flake, MD;  Location: [facility] ORS;  Service: Orthopedics;  Laterality: Right;    Current Medications: No outpatient medications have been marked as taking for the 04/15/22 encounter (Appointment) with Fransico Michael, Haden Cavenaugh H, PA-C.     Allergies:   Demeclocycline, Sulfa  antibiotics, Tetracyclines & related, Levofloxacin, Augmentin [amoxicillin-pot clavulanate], Bentyl [dicyclomine hcl], Ciprofloxacin, Codeine, Dicyclomine hcl, Epinephrine, Flagyl [metronidazole], Librax [chlordiazepoxide-clidinium], Novocain [procaine], Phenobarbital, Prednisone, and Ultram [tramadol]   Social History   Socioeconomic History   Marital status: Single    Spouse name: Not on file   Number of children: 0   Years of education: Not on file   Highest education level: Not on file  Occupational History   Not on file  Tobacco Use   Smoking status: Never   Smokeless tobacco: Never  Vaping Use   Vaping Use: Never used  Substance and Sexual Activity   Alcohol use: No    Alcohol/week: 0.0 standard drinks of alcohol   Drug use: No   Sexual activity: Not Currently  Other Topics Concern   Not on file  Social History Narrative   Not on file   Social Determinants of Health   Financial Resource Strain: Low Risk  (09/11/2021)   Overall Financial Resource Strain (CARDIA)    Difficulty of Paying Living Expenses: Not very hard  Recent Concern: Financial Resource Strain - Medium Risk (07/21/2021)   Overall Financial Resource Strain (CARDIA)    Difficulty of Paying Living Expenses: Somewhat hard  Food Insecurity: No Food Insecurity (04/10/2022)   Hunger Vital Sign    Worried About Running Out of Food in the Last Year: Never true    Ran Out of Food in the Last Year: Never true  Transportation Needs: No Transportation Needs (04/10/2022)   PRAPARE - Administrator, Civil Service (Medical): No    Lack of Transportation (Non-Medical): No  Physical Activity: Insufficiently Active (09/11/2021)   Exercise Vital Sign    Days of Exercise per Week: 3 days    Minutes of Exercise per Session: 10 min  Stress: No Stress Concern Present (09/11/2021)   [employer] of Occupational Health - Occupational Stress Questionnaire    Feeling of Stress : Not at all  Social Connections:  Socially Isolated (09/11/2021)   Social Connection and Isolation Panel [NHANES]    Frequency of Communication with Friends and Family: Once a week    Frequency of Social Gatherings with Friends and Family: Once a week    Attends Religious Services: Never    Database administrator or Organizations: No    Attends Engineer, structural: Never    Marital Status: Divorced     Family History: The patient's family history includes Alzheimer's disease in her mother; Breast cancer in her maternal aunt and sister; Cancer in her father; Colon cancer in her father; Lung cancer in her father and sister; Prostate cancer in her father; Stroke in her mother.  ROS:   Please see the history of present illness.     All other systems reviewed and are negative.  EKGs/Labs/Other Studies Reviewed:    The following studies were reviewed today:  Echo 09/2020 1. Left  ventricular ejection fraction, by estimation, is 60 to 65%. The  left ventricle has normal function. The left ventricle has no regional  wall motion abnormalities. Left ventricular diastolic parameters are  consistent with Grade I diastolic  dysfunction (impaired relaxation).   2. Right ventricular systolic function is normal. The right ventricular  size is normal. There is normal pulmonary artery systolic pressure. The  estimated right ventricular systolic pressure is 32.9 mmHg.   3. The inferior vena cava is normal in size with greater than 50%  respiratory variability, suggesting right atrial pressure of 3 mmHg.   Echo 05/2018 Study Conclusions   - Left ventricle: The cavity size was normal. There was mild    concentric hypertrophy. Systolic function was normal. The    estimated ejection fraction was in the range of 50% to 55%. Wall    motion was normal; there were no regional wall motion    abnormalities. Doppler parameters are consistent with abnormal    left ventricular relaxation (grade 1 diastolic dysfunction).  -  Pericardium, extracardiac: There was no pericardial effusion.   EKG:  EKG is *** ordered today.  The ekg ordered today demonstrates ***  Recent Labs: 04/02/2022: ALT 17 04/04/2022: B Natriuretic Peptide 369.6 04/09/2022: BUN 8; Creatinine, Ser 0.57; Hemoglobin 9.6; Magnesium 1.9; Platelets 48; Potassium 4.0; Sodium 140  Recent Lipid Panel    Component Value Date/Time   CHOL 163 02/20/2020 1045   TRIG 110.0 02/20/2020 1045   HDL 40.70 02/20/2020 1045   CHOLHDL 4 02/20/2020 1045   VLDL 22.0 02/20/2020 1045   LDLCALC 100 (H) 02/20/2020 1045   LDLDIRECT 126.0 07/11/2015 0956     Risk Assessment/Calculations:   {Does this patient have ATRIAL FIBRILLATION?:[phone]}   Physical Exam:    VS:  There were no vitals taken for this visit.    Wt Readings from Last 3 Encounters:  04/04/22 134 lb 7.7 oz (61 kg)  04/02/22 136 lb 0.4 oz (61.7 kg)  03/30/22 128 lb 3.2 oz (58.2 kg)     GEN: *** Well nourished, well developed in no acute distress HEENT: Normal NECK: No JVD; No carotid bruits LYMPHATICS: No lymphadenopathy CARDIAC: ***RRR, no murmurs, rubs, gallops RESPIRATORY:  Clear to auscultation without rales, wheezing or rhonchi  ABDOMEN: Soft, non-tender, non-distended MUSCULOSKELETAL:  No edema; No deformity  SKIN: Warm and dry NEUROLOGIC:  Alert and oriented x 3 PSYCHIATRIC:  Normal affect   ASSESSMENT:    No diagnosis found. PLAN:    In order of problems listed above:  H/o recurrent pericarditis  Palpitations  Disposition: Follow up {follow up:15908} with ***   Shared Decision Making/Informed Consent   {Are you ordering a CV Procedure (e.g. stress test, cath, DCCV, TEE, etc)?   Press F2        :[id number]}    Signed, Zeenat Jeanbaptiste David Stall, PA-C  04/15/2022 12:26 PM    [facility]

## 2022-04-16 ENCOUNTER — Other Ambulatory Visit (HOSPITAL_COMMUNITY): Payer: Self-pay

## 2022-04-16 ENCOUNTER — Encounter: Payer: Self-pay | Admitting: Medical

## 2022-04-16 ENCOUNTER — Ambulatory Visit: Payer: PPO

## 2022-04-17 ENCOUNTER — Inpatient Hospital Stay: Payer: PPO

## 2022-04-17 ENCOUNTER — Inpatient Hospital Stay: Payer: PPO | Admitting: Oncology

## 2022-04-17 ENCOUNTER — Ambulatory Visit: Payer: PPO

## 2022-04-17 NOTE — Telephone Encounter (Signed)
Juanda Crumble covers TCMs for this office. I have been out on vacation. Please send to clinical supervisor if immediate attention is needed for scheduling. TCM was completed appropriately.

## 2022-04-20 ENCOUNTER — Other Ambulatory Visit: Payer: Self-pay

## 2022-04-20 ENCOUNTER — Other Ambulatory Visit: Payer: Self-pay | Admitting: *Deleted

## 2022-04-20 ENCOUNTER — Encounter: Payer: Self-pay | Admitting: Internal Medicine

## 2022-04-20 ENCOUNTER — Telehealth (INDEPENDENT_AMBULATORY_CARE_PROVIDER_SITE_OTHER): Payer: PPO | Admitting: Internal Medicine

## 2022-04-20 VITALS — Ht 64.0 in | Wt 134.0 lb

## 2022-04-20 DIAGNOSIS — F419 Anxiety disorder, unspecified: Secondary | ICD-10-CM

## 2022-04-20 DIAGNOSIS — R7989 Other specified abnormal findings of blood chemistry: Secondary | ICD-10-CM

## 2022-04-20 DIAGNOSIS — E7800 Pure hypercholesterolemia, unspecified: Secondary | ICD-10-CM

## 2022-04-20 DIAGNOSIS — I5032 Chronic diastolic (congestive) heart failure: Secondary | ICD-10-CM

## 2022-04-20 DIAGNOSIS — Z9109 Other allergy status, other than to drugs and biological substances: Secondary | ICD-10-CM | POA: Diagnosis not present

## 2022-04-20 DIAGNOSIS — G620 Drug-induced polyneuropathy: Secondary | ICD-10-CM | POA: Diagnosis not present

## 2022-04-20 DIAGNOSIS — R5383 Other fatigue: Secondary | ICD-10-CM

## 2022-04-20 DIAGNOSIS — C9200 Acute myeloblastic leukemia, not having achieved remission: Secondary | ICD-10-CM

## 2022-04-20 DIAGNOSIS — F320 Major depressive disorder, single episode, mild: Secondary | ICD-10-CM

## 2022-04-20 DIAGNOSIS — K219 Gastro-esophageal reflux disease without esophagitis: Secondary | ICD-10-CM | POA: Diagnosis not present

## 2022-04-20 DIAGNOSIS — J101 Influenza due to other identified influenza virus with other respiratory manifestations: Secondary | ICD-10-CM

## 2022-04-20 DIAGNOSIS — E039 Hypothyroidism, unspecified: Secondary | ICD-10-CM | POA: Diagnosis not present

## 2022-04-20 DIAGNOSIS — T451X5A Adverse effect of antineoplastic and immunosuppressive drugs, initial encounter: Secondary | ICD-10-CM

## 2022-04-20 DIAGNOSIS — Z853 Personal history of malignant neoplasm of breast: Secondary | ICD-10-CM

## 2022-04-20 DIAGNOSIS — C73 Malignant neoplasm of thyroid gland: Secondary | ICD-10-CM

## 2022-04-20 NOTE — Progress Notes (Deleted)
Subjective:    Patient ID: Debra Hale, female    DOB: [birth year], 71 y.o.   MRN: [id number]  Patient here for No chief complaint on file.   HPI Here for hospital follow up. Admitted 04/04/22 - 04/09/22.  Diagnosed with influenza. Treated with tamiflu.  S/p Vanco and cefepime and given blood cultures negative so antibiotics were discontinued.  Patient was kept on neutropenic precautions.  Patient was seen by heme-onc, recommended no intervention and follow-up as an outpatient. Hgb decreased to 6.8.  required transfusion.  Oncology consulted.  Pt is following up with Dr. 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 Recent bone marrow biopsy revealed mildly progressive disease with her total blast count of approximately 15%.  Up from 5% in March. Patient was scheduled to start treatment with Vidaza and venetoclax the week of her hospitalization.  Was postponed.  Needs f/u with oncology.  2D echo on 09/05/2020 showed EF acetic acid 5% with grade 1 diastolic dysfunction.  BNP 369, chest x-ray showed mild interstitial edema, but no leg edema or JVD. No shortness of breath. Recommended resuming home lasix.  She reports still feeling weak.  Decreased appetite.     Past Medical History:  Diagnosis Date   Abdominal pain 03/06/2016   Acute pericarditis 05/31/2013   Anxiety    Arthritis    Osteoarthritis   Breast cancer ([place]) 2009   right breast lumpectomy with rad tx   Colon cancer ([place])    surgery with chemo and rad tx   Complication of anesthesia    GERD (gastroesophageal reflux disease)    Heart palpitations    History of hiatal hernia    History of kidney stones    History of thyroid cancer 09/18/2008   Qualifier: Diagnosis of   By: Orville Govern CMA, Carol       Hypothyroidism    Liver disease    Liver nodule    s/p negative biopsy   Malignant neoplasm of thyroid gland ([place]) 2002   s/p surgery and XRT   Osteoporosis    Other and unspecified hyperlipidemia    Palpitations    Personal history  of chemotherapy    Personal history of malignant neoplasm of large intestine    carcinoma - cecum, s/p right laparoscopic colectomy - s/p chemotherapy and XRT   Personal history of radiation therapy    Pneumonia 2019   PONV (postoperative nausea and vomiting)    Pure hypercholesterolemia    Unspecified hereditary and idiopathic peripheral neuropathy    Past Surgical History:  Procedure Laterality Date   APPENDECTOMY  1985   BREAST BIOPSY Right 2009   positive   BREAST BIOPSY Right 2009   negative   BREAST LUMPECTOMY Right 2009   breast cancer   CHOLECYSTECTOMY  1995   COLONOSCOPY     COLONOSCOPY WITH PROPOFOL N/A 10/29/2017   Procedure: COLONOSCOPY WITH PROPOFOL;  Surgeon: Manya Silvas, MD;  Location: [facility] ENDOSCOPY;  Service: Endoscopy;  Laterality: N/A;   DILATION AND CURETTAGE OF UTERUS  1990   DILATION AND CURETTAGE, DIAGNOSTIC / THERAPEUTIC  1990   ESOPHAGOGASTRODUODENOSCOPY     ESOPHAGOGASTRODUODENOSCOPY (EGD) WITH PROPOFOL N/A 10/29/2017   Procedure: ESOPHAGOGASTRODUODENOSCOPY (EGD) WITH PROPOFOL;  Surgeon: Manya Silvas, MD;  Location: [facility] ENDOSCOPY;  Service: Endoscopy;  Laterality: N/A;   INCISION AND DRAINAGE PERIRECTAL ABSCESS N/A 06/17/2020   Procedure: IRRIGATION AND DEBRIDEMENT PERIRECTAL ABSCESS;  Surgeon: Jules Husbands, MD;  Location: [facility] ORS;  Service: General;  Laterality: N/A;  IR BONE MARROW BIOPSY & ASPIRATION  07/09/2021   LAPAROSCOPIC PARTIAL COLECTOMY     stage 3-C carcinoma of the cecum, s/p chemotherapy and xrt   LITHOTRIPSY     SIGMOIDOSCOPY  08/26/1993   THYROID LOBECTOMY  2002   s/p XRT   TOTAL HIP ARTHROPLASTY Right 10/24/2019   Procedure: TOTAL HIP ARTHROPLASTY;  Surgeon: Corky Mull, MD;  Location: [facility] ORS;  Service: Orthopedics;  Laterality: Right;   Family History  Problem Relation Age of Onset   Stroke Mother        69s   Alzheimer's disease Mother    Lung cancer Father    Prostate cancer Father    Cancer Father        Colon    Colon cancer Father    Breast cancer Sister        22's   Lung cancer Sister    Breast cancer Maternal Aunt    Social History   Socioeconomic History   Marital status: Single    Spouse name: Not on file   Number of children: 0   Years of education: Not on file   Highest education level: Not on file  Occupational History   Not on file  Tobacco Use   Smoking status: Never   Smokeless tobacco: Never  Vaping Use   Vaping Use: Never used  Substance and Sexual Activity   Alcohol use: No    Alcohol/week: 0.0 standard drinks of alcohol   Drug use: No   Sexual activity: Not Currently  Other Topics Concern   Not on file  Social History Narrative   Not on file   Social Determinants of Health   Financial Resource Strain: Low Risk  (09/11/2021)   Overall Financial Resource Strain (CARDIA)    Difficulty of Paying Living Expenses: Not very hard  Recent Concern: Financial Resource Strain - Medium Risk (07/21/2021)   Overall Financial Resource Strain (CARDIA)    Difficulty of Paying Living Expenses: Somewhat hard  Food Insecurity: No Food Insecurity (04/10/2022)   Hunger Vital Sign    Worried About Running Out of Food in the Last Year: Never true    Ran Out of Food in the Last Year: Never true  Transportation Needs: No Transportation Needs (04/10/2022)   PRAPARE - Hydrologist (Medical): No    Lack of Transportation (Non-Medical): No  Physical Activity: Insufficiently Active (09/11/2021)   Exercise Vital Sign    Days of Exercise per Week: 3 days    Minutes of Exercise per Session: 10 min  Stress: No Stress Concern Present (09/11/2021)   [place]    Feeling of Stress : Not at all  Social Connections: Socially Isolated (09/11/2021)   Social Connection and Isolation Panel [NHANES]    Frequency of Communication with Friends and Family: Once a week    Frequency of Social Gatherings with  Friends and Family: Once a week    Attends Religious Services: Never    Marine scientist or Organizations: No    Attends Archivist Meetings: Never    Marital Status: Divorced     Review of Systems     Objective:     There were no vitals taken for this visit. Wt Readings from Last 3 Encounters:  04/04/22 134 lb 7.7 oz (61 kg)  04/02/22 136 lb 0.4 oz (61.7 kg)  03/30/22 128 lb 3.2 oz (58.2 kg)  Physical Exam   Outpatient Encounter Medications as of 04/20/2022  Medication Sig   Azelastine-Fluticasone 137-50 MCG/ACT SUSP SPRAY 2 SPRAYS INTO EACH NOSTRIL EVERY DAY   clonazePAM (KLONOPIN) 0.5 MG tablet Take 1 tablet (0.5 mg total) by mouth 3 (three) times daily as needed for anxiety.   diphenoxylate-atropine (LOMOTIL) 2.5-0.025 MG tablet Take 1 tablet by mouth 4 (four) times daily as needed for diarrhea or loose stools.   escitalopram (LEXAPRO) 20 MG tablet Take 20 mg by mouth every morning.    levothyroxine (SYNTHROID) 125 MCG tablet TAKE 1 TABLET EVERY DAY ON EMPTY STOMACHWITH A GLASS OF WATER AT LEAST 30-60 MINBEFORE BREAKFAST   loperamide (IMODIUM) 2 MG capsule Take by mouth.   metoprolol tartrate (LOPRESSOR) 25 MG tablet TAKE 1 TABLET (25 MG TOTAL) BY MOUTH AS NEEDED (AS NEEDED UP TO TWICE DAILY FOR PALPITATIONS).   nitrofurantoin, macrocrystal-monohydrate, (MACROBID) 100 MG capsule Take 1 capsule (100 mg total) by mouth 2 (two) times daily.   pantoprazole (PROTONIX) 40 MG tablet TAKE ONE TABLET (40 MG) BY MOUTH EVERY DAY   potassium chloride SA (KLOR-CON M) 20 MEQ tablet Take 20 mEq by mouth daily.   venetoclax (VENCLEXTA) 100 MG tablet Take 4 tablets (400 mg total) by mouth daily. Take for 14 days, then hold for 14 days. Repeat every 28 days. Take with a meal and a full glass of water.   No facility-administered encounter medications on file as of 04/20/2022.     Lab Results  Component Value Date   WBC 0.3 (LL) 04/09/2022   HGB 9.6 (L) 04/09/2022   HCT  28.2 (L) 04/09/2022   PLT 48 (L) 04/09/2022   GLUCOSE 102 (H) 04/09/2022   CHOL 163 02/20/2020   TRIG 110.0 02/20/2020   HDL 40.70 02/20/2020   LDLDIRECT 126.0 07/11/2015   LDLCALC 100 (H) 02/20/2020   ALT 17 04/02/2022   AST 9 (L) 04/02/2022   NA 140 04/09/2022   K 4.0 04/09/2022   CL 114 (H) 04/09/2022   CREATININE 0.57 04/09/2022   BUN 8 04/09/2022   CO2 21 (L) 04/09/2022   TSH 0.034 (L) 10/02/2020   INR 1.3 (H) 04/04/2022   HGBA1C 5.3 12/10/2016    DG Chest Portable 1 View  Result Date: 04/04/2022 CLINICAL DATA:  Confusion and altered mental status this morning. EXAM: PORTABLE CHEST 1 VIEW COMPARISON:  November 15, 2020 FINDINGS: The mediastinal contour is normal. The heart size is mildly enlarged. Increased central pulmonary vessel caliber noted. There is no focal pneumonia, pulmonary edema or pleural effusion. The visualized skeletal structures are stable. IMPRESSION: Mild congestive heart failure. Electronically Signed   By: Abelardo Diesel M.D.   On: 04/04/2022 12:03   CT HEAD WO CONTRAST (5MM)  Result Date: 04/04/2022 CLINICAL DATA:  Mental status change with unknown cause EXAM: CT HEAD WITHOUT CONTRAST TECHNIQUE: Contiguous axial images were obtained from the base of the skull through the vertex without intravenous contrast. RADIATION DOSE REDUCTION: This exam was performed according to the departmental dose-optimization program which includes automated exposure control, adjustment of the mA and/or kV according to patient size and/or use of iterative reconstruction technique. COMPARISON:  04/29/2021 FINDINGS: Brain: No evidence of acute infarction, hemorrhage, hydrocephalus, extra-axial collection or mass lesion/mass effect. Vascular: No hyperdense vessel or unexpected calcification. Skull: Normal. Negative for fracture or focal lesion. Sinuses/Orbits: Extensive opacification of the right maxillary sinus, chronic when compared to prior and with sclerotic wall thickening. Nonspecific  calcification lateral to the left pterygoid body, stable  and non worrisome. IMPRESSION: 1. No acute or interval finding. 2. Chronic, focal right maxillary sinusitis. Electronically Signed   By: Jorje Guild M.D.   On: 04/04/2022 11:11       Assessment & Plan:  There are no diagnoses linked to this encounter.   [name], MD

## 2022-04-21 ENCOUNTER — Other Ambulatory Visit: Payer: Self-pay | Admitting: Oncology

## 2022-04-21 ENCOUNTER — Inpatient Hospital Stay: Payer: PPO

## 2022-04-21 ENCOUNTER — Inpatient Hospital Stay: Payer: PPO | Admitting: Oncology

## 2022-04-21 ENCOUNTER — Ambulatory Visit: Payer: PPO | Admitting: Pharmacist

## 2022-04-21 ENCOUNTER — Other Ambulatory Visit: Payer: PPO

## 2022-04-22 ENCOUNTER — Inpatient Hospital Stay: Payer: PPO | Attending: Oncology

## 2022-04-22 ENCOUNTER — Inpatient Hospital Stay: Payer: PPO | Admitting: Pharmacist

## 2022-04-22 VITALS — BP 113/68 | HR 107 | Temp 99.0°F | Resp 18

## 2022-04-22 DIAGNOSIS — Z79899 Other long term (current) drug therapy: Secondary | ICD-10-CM | POA: Diagnosis not present

## 2022-04-22 DIAGNOSIS — Z9221 Personal history of antineoplastic chemotherapy: Secondary | ICD-10-CM | POA: Insufficient documentation

## 2022-04-22 DIAGNOSIS — Z923 Personal history of irradiation: Secondary | ICD-10-CM | POA: Insufficient documentation

## 2022-04-22 DIAGNOSIS — C9200 Acute myeloblastic leukemia, not having achieved remission: Secondary | ICD-10-CM

## 2022-04-22 LAB — CBC WITH DIFFERENTIAL/PLATELET
Abs Immature Granulocytes: 0.02 10*3/uL (ref 0.00–0.07)
Basophils Absolute: 0 10*3/uL (ref 0.0–0.1)
Basophils Relative: 0 %
Eosinophils Absolute: 0 10*3/uL (ref 0.0–0.5)
Eosinophils Relative: 0 %
HCT: 29.2 % — ABNORMAL LOW (ref 36.0–46.0)
Hemoglobin: 10.4 g/dL — ABNORMAL LOW (ref 12.0–15.0)
Immature Granulocytes: 3 %
Lymphocytes Relative: 74 %
Lymphs Abs: 0.5 10*3/uL — ABNORMAL LOW (ref 0.7–4.0)
MCH: 31.8 pg (ref 26.0–34.0)
MCHC: 35.6 g/dL (ref 30.0–36.0)
MCV: 89.3 fL (ref 80.0–100.0)
Monocytes Absolute: 0 10*3/uL — ABNORMAL LOW (ref 0.1–1.0)
Monocytes Relative: 5 %
Neutro Abs: 0.1 10*3/uL — CL (ref 1.7–7.7)
Neutrophils Relative %: 18 %
Platelets: 144 10*3/uL — ABNORMAL LOW (ref 150–400)
RBC: 3.27 MIL/uL — ABNORMAL LOW (ref 3.87–5.11)
RDW: 15.2 % (ref 11.5–15.5)
Smear Review: NORMAL
WBC: 0.6 10*3/uL — CL (ref 4.0–10.5)
nRBC: 0 % (ref 0.0–0.2)

## 2022-04-22 LAB — COMPREHENSIVE METABOLIC PANEL
ALT: 17 U/L (ref 0–44)
AST: 12 U/L — ABNORMAL LOW (ref 15–41)
Albumin: 3.8 g/dL (ref 3.5–5.0)
Alkaline Phosphatase: 83 U/L (ref 38–126)
Anion gap: 8 (ref 5–15)
BUN: 17 mg/dL (ref 8–23)
CO2: 22 mmol/L (ref 22–32)
Calcium: 8.5 mg/dL — ABNORMAL LOW (ref 8.9–10.3)
Chloride: 109 mmol/L (ref 98–111)
Creatinine, Ser: 0.94 mg/dL (ref 0.44–1.00)
GFR, Estimated: >60 mL/min (ref >60)
Glucose, Bld: 126 mg/dL — ABNORMAL HIGH (ref 70–99)
Potassium: 3.6 mmol/L (ref 3.5–5.1)
Sodium: 139 mmol/L (ref 135–145)
Total Bilirubin: 0.8 mg/dL (ref 0.3–1.2)
Total Protein: 7 g/dL (ref 6.5–8.1)

## 2022-04-22 MED ORDER — PROCHLORPERAZINE MALEATE 10 MG PO TABS: 10.0000 mg | ORAL_TABLET | Freq: Four times a day (QID) | ORAL | 0 refills | Status: DC | PRN

## 2022-04-22 NOTE — Progress Notes (Signed)
Patient here to meet with clinical pharmacist. She is currently not on venclexta. She has not started this med at this time.  Patient c/o nausea. She does not have any antiemetics. She is taking imodium as directed. She has had 2 loose stools today. Patient stated that she has drank orange juice and water and ginger ale. She has not eaten anything today but a pc of toast.

## 2022-04-22 NOTE — Progress Notes (Signed)
[place]  [phone] [phone]  Patient Care Team: [name], MD as PCP - General (Internal Medicine) Wellington Hampshire, MD as PCP - Cardiology (Cardiology) Lloyd Huger, MD as Consulting Physician (Oncology)   Name of the patient: Debra Hale  [id number]  04-08-1951   Date of visit: 04/22/22  HPI: Patient is a 71 y.o. female with AML. Her current treatment of azacitidine and venetoclax has been on hold due to two recent hospitalization and a repeat BM biopsy that should disease progression.   Reason for Consult: Patient in clinic today for a post-hospital evaluation and discussion about change in therapy.    PAST MEDICAL HISTORY: Past Medical History:  Diagnosis Date   Abdominal pain 03/06/2016   Acute pericarditis 05/31/2013   Anxiety    Arthritis    Osteoarthritis   Breast cancer ([place]) 2009   right breast lumpectomy with rad tx   Colon cancer ([place])    surgery with chemo and rad tx   Complication of anesthesia    GERD (gastroesophageal reflux disease)    Heart palpitations    History of hiatal hernia    History of kidney stones    History of thyroid cancer 09/18/2008   Qualifier: Diagnosis of   By: Orville Govern CMA, Carol       Hypothyroidism    Liver disease    Liver nodule    s/p negative biopsy   Malignant neoplasm of thyroid gland ([place]) 2002   s/p surgery and XRT   Osteoporosis    Other and unspecified hyperlipidemia    Palpitations    Personal history of chemotherapy    Personal history of malignant neoplasm of large intestine    carcinoma - cecum, s/p right laparoscopic colectomy - s/p chemotherapy and XRT   Personal history of radiation therapy    Pneumonia 2019   PONV (postoperative nausea and vomiting)    Pure hypercholesterolemia    Unspecified hereditary and idiopathic peripheral neuropathy     HEMATOLOGY/ONCOLOGY HISTORY:  Oncology History  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[place])  04/12/2020 Initial Diagnosis   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  MEDICATIONS:  Current Outpatient Medications  Medication Sig Dispense Refill   Azelastine-Fluticasone 137-50 MCG/ACT SUSP SPRAY 2 SPRAYS INTO EACH NOSTRIL EVERY DAY 23 g 3   clonazePAM (KLONOPIN) 0.5 MG tablet Take 1 tablet (0.5 mg total) by mouth 3 (three) times daily as needed for anxiety. 30 tablet 0   diphenoxylate-atropine (LOMOTIL) 2.5-0.025 MG tablet Take 1 tablet by mouth 4 (four) times daily as needed for diarrhea or loose stools. 60 tablet 1   escitalopram (LEXAPRO) 20 MG tablet Take 20 mg by mouth every morning.      levothyroxine (SYNTHROID) 125 MCG tablet TAKE 1 TABLET EVERY DAY ON EMPTY STOMACHWITH A GLASS OF WATER AT LEAST 30-60 MINBEFORE BREAKFAST 90 tablet 1   loperamide (IMODIUM) 2 MG capsule Take by mouth.     metoprolol tartrate (LOPRESSOR) 25 MG tablet TAKE 1 TABLET (25  MG TOTAL) BY MOUTH AS NEEDED (AS NEEDED UP TO TWICE DAILY FOR PALPITATIONS). 60 tablet 1   nitrofurantoin, macrocrystal-monohydrate, (MACROBID) 100 MG capsule Take 1 capsule (100 mg total) by mouth 2 (two) times daily. 10 capsule 0   pantoprazole (PROTONIX) 40 MG tablet TAKE ONE TABLET (40 MG) BY MOUTH EVERY DAY 90 tablet 2   potassium chloride SA (KLOR-CON M) 20 MEQ tablet Take 20 mEq by mouth daily.     venetoclax (VENCLEXTA) 100 MG tablet Take 4 tablets (400 mg  total) by mouth daily. Take for 14 days, then hold for 14 days. Repeat every 28 days. Take with a meal and a full glass of water. 56 tablet 1   No current facility-administered medications for this visit.    VITAL SIGNS: There were no vitals taken for this visit. There were no vitals filed for this visit.  Estimated body mass index is 23 kg/m as calculated from the following:   Height as of 04/20/22: _0  (1.626 m).   Weight as of 04/20/22: 60.8 kg (134 lb).  LABS: CBC:    Component Value Date/Time   WBC 0.3 (LL) 04/09/2022 0519   HGB 9.6 (L) 04/09/2022 0519   HGB 12.5 05/09/2018 1147   HCT 28.2 (L) 04/09/2022 0519   HCT 36.8 05/09/2018 1147   PLT 48 (L) 04/09/2022 0519   PLT 285 05/09/2018 1147   MCV 86.8 04/09/2022 0519   MCV 86 05/09/2018 1147   MCV 89 11/30/2013 1419   NEUTROABS 0.1 (LL) 04/09/2022 0519   NEUTROABS 2.9 11/20/2013 1404   LYMPHSABS 0.2 (L) 04/09/2022 0519   LYMPHSABS 0.9 (L) 11/20/2013 1404   MONOABS 0.0 (L) 04/09/2022 0519   MONOABS 0.2 11/20/2013 1404   EOSABS 0.0 04/09/2022 0519   EOSABS 0.1 11/20/2013 1404   BASOSABS 0.0 04/09/2022 0519   BASOSABS 0.0 11/20/2013 1404   Comprehensive Metabolic Panel:    Component Value Date/Time   NA 140 04/09/2022 0519   NA 143 05/09/2018 1147   NA 145 11/30/2013 1419   K 4.0 04/09/2022 0519   K 3.5 11/30/2013 1419   CL 114 (H) 04/09/2022 0519   CL 109 (H) 11/30/2013 1419   CO2 21 (L) 04/09/2022 0519   CO2 29 11/30/2013 1419   BUN 8 04/09/2022 0519   BUN 11 05/09/2018 1147   BUN 15 11/30/2013 1419   CREATININE 0.57 04/09/2022 0519   CREATININE 1.04 11/30/2013 1419   GLUCOSE 102 (H) 04/09/2022 0519   GLUCOSE 97 11/30/2013 1419   CALCIUM 8.1 (L) 04/09/2022 0519   CALCIUM 8.7 11/30/2013 1419   AST 9 (L) 04/02/2022 0515   AST 28 11/30/2013 1419   ALT 17 04/02/2022 0515   ALT 71 (H) 11/30/2013 1419   ALKPHOS 68 04/02/2022 0515   ALKPHOS 114 11/30/2013 1419   BILITOT 0.8 04/02/2022 0515   BILITOT 0.7  11/30/2013 1419   PROT 5.3 (L) 04/02/2022 0515   PROT 7.1 11/30/2013 1419   ALBUMIN 3.1 (L) 04/02/2022 0515   ALBUMIN 3.3 (L) 11/30/2013 1419     Present during today's visit: patient only  Assessment and Plan: CBC/CMP reviewed WBC increased since last check, but ANC remains at 0.1, Plt increased D/t ongoing neutropenia, prescriptions for levofloxacin and valacyclovir were resent to patient's local pharmacy for infection prophylaxis Hgb has continued to increase since requiring blood transfusion on 04/06/22 Patient last received azacitidine the week of 02/09/22. Since then treatment has been on hold due to two hospitalization (first for acute  right flank pain and second for influenza A)  Before her first hospitalization she has a repeat BM biopsy completed on 03/23/22, which found hypercellular bone marrow (60%) with increased blasts,15%. Dr. [name] would like to change therapy based on disease progression found in the BM to [facility] mutation   Start plan for Olutasidenib: Pt to see MD in 2 weeks to discuss treatment initiation, will be dependent on patient's recovery from the flu, the is still feeling fatigued currently    Patient Education- Olutasidenib I spoke with patient for overview of new oral chemotherapy medication: Olutasidenib   Administration: Counseled patient on administration, dosing, side effects, monitoring, drug-food interactions, safe handling, storage, and disposal. Patient will take 1 capsule (150 mg) by mouth twice daily.  Monitoring needed:  CMP/LFTs: Prior to initiation, at least once weekly for the first 2 months, once every other week for the third month, once in the fourth month, and once every other month thereafter for the duration of therapy   CBC: Prior to initiation, at least once weekly for the first 2 months, once every other week for the third month, once in the fourth month, and once every other month  Side Effects: Side effects include but not limited  to: nausea, fatigue, increase in LFT, rash, Differentiation syndrome    Drug-drug Interactions (DDI): No current DDIs with olutasidenib  Adherence: After discussion with patient no patient barriers to medication adherence identified.  Reviewed with patient importance of keeping a medication schedule and plan for any missed doses.  Ms. Hawkey voiced understanding and appreciation. All questions answered. Medication handout provided.   New medications: none reported  Medication Access Issues: PA approved and patient has grant to cover the cost  Patient expressed understanding and was in agreement with this plan. She also understands that She can call clinic at any time with any questions, concerns, or complaints.   Follow-up plan: RTC in 2 weeks for labs/MD/Pharm- for consideration of initiating new treatment  Thank you for allowing me to participate in the care of this very pleasant patient.   Time Total: 20 mins  Visit consisted of counseling and education on dealing with issues of symptom management in the setting of serious and potentially life-threatening illness.Greater than 50%  of this time was spent counseling and coordinating care related to the above assessment and plan.  Signed by: Darl Pikes, PharmD, BCPS, Salley Slaughter, CPP Hematology/Oncology Clinical Pharmacist Practitioner [place]/DB/AP [facility] [phone]  04/22/2022 12:43 PM

## 2022-04-23 ENCOUNTER — Telehealth: Payer: Self-pay

## 2022-04-23 ENCOUNTER — Other Ambulatory Visit (HOSPITAL_COMMUNITY): Payer: Self-pay

## 2022-04-23 MED ORDER — LEVOFLOXACIN 500 MG PO TABS: 500.0000 mg | ORAL_TABLET | Freq: Every day | ORAL | 0 refills | Status: DC

## 2022-04-23 MED ORDER — VALACYCLOVIR HCL 500 MG PO TABS: 500.0000 mg | ORAL_TABLET | Freq: Every day | ORAL | 2 refills | Status: DC

## 2022-04-23 NOTE — Telephone Encounter (Addendum)
Oral Oncology Patient Advocate Encounter  Prior Authorization for Lutricia Horsfall has been approved.    PA# [id number]  Effective dates: 04/23/22 through 04/24/23  Patients co-pay is $1473.15.  Patient has active [employer] Grant to bring co-pay to $0. [facility] is able to order and dispense this medication.   Berdine Addison, [facility] Patient [place]  [phone] (phone) [phone] (fax) 04/23/2022 10:40 AM

## 2022-04-23 NOTE — Progress Notes (Signed)
Called to check in on Debra Hale on 04/23/22. She reports feeling better today than yesterday.

## 2022-04-23 NOTE — Telephone Encounter (Signed)
Oral Oncology Patient Advocate Encounter   Received notification that prior authorization for Rezlidhia is required.   PA submitted on 04/23/22  Key [id number]  Status is pending     Berdine Addison, [place] Patient [place]  [phone] (phone) [phone] (fax) 04/23/2022 8:53 AM

## 2022-04-28 ENCOUNTER — Other Ambulatory Visit: Payer: PPO

## 2022-04-28 ENCOUNTER — Ambulatory Visit: Payer: PPO | Admitting: Oncology

## 2022-04-30 ENCOUNTER — Other Ambulatory Visit (HOSPITAL_COMMUNITY): Payer: Self-pay

## 2022-05-01 ENCOUNTER — Encounter: Payer: Self-pay | Admitting: Internal Medicine

## 2022-05-01 NOTE — Assessment & Plan Note (Signed)
Does well with dymista.  Headache resolves with dymista.

## 2022-05-01 NOTE — Assessment & Plan Note (Signed)
Recent admission as outlined.  Feels a little stronger.  Still with decreased appetite. Still with weakness.  Some balance issues.  Has known neuropathy.  Discussed home health - PT.

## 2022-05-01 NOTE — Progress Notes (Signed)
Patient ID: Debra Hale, female   DOB: Apr 19, 1951, 71 y.o.   MRN: [id number]   Virtual Visit via telephone Note   All issues noted in this document were discussed and addressed.  No physical exam was performed (except for noted visual exam findings with Video Visits).   I connected with Debra Hale today by telephone and verified that I am speaking with the correct person using two identifiers. Location patient: home Location provider: work  Persons participating in the telephone visit: patient, provider  The limitations, risks, security and privacy concerns of performing an evaluation and management service by telephone and the availability of in person appointments have been discussed.   Interactive audio and video telecommunications were attempted between this provider and patient, however failed, due to patient having technical difficulties OR patient did not have access to video capability.  We continued and completed visit with audio only.   Reason for visit: hospital follow up.   HPI: Hospital follow up. Admitted 04/04/22 - 04/09/22.  Diagnosed with influenza. Treated with tamiflu.  S/p Vanco and cefepime and given blood cultures negative so antibiotics were discontinued.  Patient was kept on neutropenic precautions.  Patient was seen by heme-onc, recommended no intervention and follow-up as an outpatient. Hgb decreased to 6.8.  required transfusion.  Oncology consulted.  Pt is following up with Dr. 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 Recent bone marrow biopsy revealed mildly progressive disease with her total blast count of approximately 15%.  Up from 5% in March. Patient was scheduled to start treatment with Vidaza and venetoclax the week of her hospitalization.  Was postponed.  Needs f/u with oncology.  2D echo on 09/05/2020 showed EF 60-65% with grade 1 diastolic dysfunction.  BNP 369, chest x-ray showed mild interstitial edema, but no leg edema or JVD. No shortness of breath.  Recommended resuming home lasix.  She reports still feeling weak.  Decreased appetite.  Does feel a little stronger than when discharged. No fever.  Intermittent balance issues.  Discussed home health - PT.  Reports breathing is better.  Cough is gone.  No vomiting.  Some drainage - nasal spray helps.    ROS: See pertinent positives and negatives per HPI.  Past Medical History:  Diagnosis Date   Abdominal pain 03/06/2016   Acute pericarditis 05/31/2013   Anxiety    Arthritis    Osteoarthritis   Breast cancer ([place]) 2009   right breast lumpectomy with rad tx   Colon cancer ([place])    surgery with chemo and rad tx   Complication of anesthesia    GERD (gastroesophageal reflux disease)    Heart palpitations    History of hiatal hernia    History of kidney stones    History of thyroid cancer 09/18/2008   Qualifier: Diagnosis of   By: Orville Govern CMA, Carol       Hypothyroidism    Liver disease    Liver nodule    s/p negative biopsy   Malignant neoplasm of thyroid gland ([place]) 2002   s/p surgery and XRT   Osteoporosis    Other and unspecified hyperlipidemia    Palpitations    Personal history of chemotherapy    Personal history of malignant neoplasm of large intestine    carcinoma - cecum, s/p right laparoscopic colectomy - s/p chemotherapy and XRT   Personal history of radiation therapy    Pneumonia 2019   PONV (postoperative nausea and vomiting)    Pure hypercholesterolemia    Unspecified  hereditary and idiopathic peripheral neuropathy     Past Surgical History:  Procedure Laterality Date   APPENDECTOMY  1985   BREAST BIOPSY Right 2009   positive   BREAST BIOPSY Right 2009   negative   BREAST LUMPECTOMY Right 2009   breast cancer   CHOLECYSTECTOMY  1995   COLONOSCOPY     COLONOSCOPY WITH PROPOFOL N/A 10/29/2017   Procedure: COLONOSCOPY WITH PROPOFOL;  Surgeon: Manya Silvas, MD;  Location: [facility] ENDOSCOPY;  Service: Endoscopy;  Laterality: N/A;   DILATION AND CURETTAGE OF  UTERUS  1990   DILATION AND CURETTAGE, DIAGNOSTIC / THERAPEUTIC  1990   ESOPHAGOGASTRODUODENOSCOPY     ESOPHAGOGASTRODUODENOSCOPY (EGD) WITH PROPOFOL N/A 10/29/2017   Procedure: ESOPHAGOGASTRODUODENOSCOPY (EGD) WITH PROPOFOL;  Surgeon: Manya Silvas, MD;  Location: [facility] ENDOSCOPY;  Service: Endoscopy;  Laterality: N/A;   INCISION AND DRAINAGE PERIRECTAL ABSCESS N/A 06/17/2020   Procedure: IRRIGATION AND DEBRIDEMENT PERIRECTAL ABSCESS;  Surgeon: Jules Husbands, MD;  Location: [facility] ORS;  Service: General;  Laterality: N/A;   IR BONE MARROW BIOPSY & ASPIRATION  07/09/2021   LAPAROSCOPIC PARTIAL COLECTOMY     stage 3-C carcinoma of the cecum, s/p chemotherapy and xrt   LITHOTRIPSY     SIGMOIDOSCOPY  08/26/1993   THYROID LOBECTOMY  2002   s/p XRT   TOTAL HIP ARTHROPLASTY Right 10/24/2019   Procedure: TOTAL HIP ARTHROPLASTY;  Surgeon: Corky Mull, MD;  Location: [facility] ORS;  Service: Orthopedics;  Laterality: Right;    Family History  Problem Relation Age of Onset   Stroke Mother        70s   Alzheimer's disease Mother    Lung cancer Father    Prostate cancer Father    Cancer Father        Colon   Colon cancer Father    Breast cancer Sister        60's   Lung cancer Sister    Breast cancer Maternal Aunt     SOCIAL HX: reviewed.    Current Outpatient Medications:    Azelastine-Fluticasone 137-50 MCG/ACT SUSP, SPRAY 2 SPRAYS INTO EACH NOSTRIL EVERY DAY, Disp: 23 g, Rfl: 3   clonazePAM (KLONOPIN) 0.5 MG tablet, Take 1 tablet (0.5 mg total) by mouth 3 (three) times daily as needed for anxiety., Disp: 30 tablet, Rfl: 0   diphenoxylate-atropine (LOMOTIL) 2.5-0.025 MG tablet, Take 1 tablet by mouth 4 (four) times daily as needed for diarrhea or loose stools., Disp: 60 tablet, Rfl: 1   escitalopram (LEXAPRO) 20 MG tablet, Take 20 mg by mouth every morning. , Disp: , Rfl:    levothyroxine (SYNTHROID) 125 MCG tablet, TAKE 1 TABLET EVERY DAY ON EMPTY STOMACHWITH A GLASS OF WATER AT LEAST  30-60 MINBEFORE BREAKFAST, Disp: 90 tablet, Rfl: 1   loperamide (IMODIUM) 2 MG capsule, Take by mouth., Disp: , Rfl:    metoprolol tartrate (LOPRESSOR) 25 MG tablet, TAKE 1 TABLET (25 MG TOTAL) BY MOUTH AS NEEDED (AS NEEDED UP TO TWICE DAILY FOR PALPITATIONS). (Patient not taking: Reported on 04/22/2022), Disp: 60 tablet, Rfl: 1   pantoprazole (PROTONIX) 40 MG tablet, TAKE ONE TABLET (40 MG) BY MOUTH EVERY DAY, Disp: 90 tablet, Rfl: 2   potassium chloride SA (KLOR-CON M) 20 MEQ tablet, Take 20 mEq by mouth daily., Disp: , Rfl:    levofloxacin (LEVAQUIN) 500 MG tablet, Take 1 tablet (500 mg total) by mouth daily., Disp: 30 tablet, Rfl: 0   prochlorperazine (COMPAZINE) 10 MG tablet, Take 1 tablet (  10 mg total) by mouth every 6 (six) hours as needed for nausea or vomiting., Disp: 30 tablet, Rfl: 0   valACYclovir (VALTREX) 500 MG tablet, Take 1 tablet (500 mg total) by mouth daily., Disp: 30 tablet, Rfl: 2  EXAM:  GENERAL: alert, oriented, answering questions appropriately.  Sounds to be in no acute distress.   PSYCH/NEURO: pleasant and cooperative, speech and thought processing grossly intact  ASSESSMENT AND PLAN:  Discussed the following assessment and plan:  Problem List Items Addressed This Visit     Abnormal liver function test    Lab Results  Component Value Date   ALT 17 04/22/2022   AST 12 (L) 04/22/2022   ALKPHOS 83 04/22/2022   BILITOT 0.8 04/22/2022   Within normal limits last check.       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  Recommended no intervention and follow-up as an outpatient. Hgb decreased to 6.8.  required transfusion.  Oncology consulted.  Pt is following up with Dr. 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 Recent bone marrow biopsy revealed mildly progressive disease with her total blast count of approximately 15%.  Up from 5% in March. Patient was scheduled to start treatment with Vidaza and  venetoclax the week of her hospitalization.  Was postponed.  Needs f/u with oncology. Discussed.       Relevant Orders   Ambulatory referral to [name].  Continue f/u with Dr Clovis Riley       Chemotherapy-induced neuropathy ([place])    Stable.       Chronic diastolic CHF (congestive heart failure) ([place])    2D echo on 09/05/2020 showed EF 60-65% with grade 1 diastolic dysfunction.  BNP 369, chest x-ray showed mild interstitial edema, but no leg edema or JVD. No shortness of breath. Recommended resuming home lasix.       Environmental allergies    Does well with dymista.  Headache resolves with dymista.       GERD (gastroesophageal reflux disease)    No upper symptoms reported. On protonix.       History of breast cancer    Mammogram 07/29/21 - Birads II.       Hypercholesterolemia    Follow lipid panel.       Hypothyroidism    On thyroid replacement.  Follow tsh.       Influenza A    Recent admission as outlined.  Treated with tamiflu.  Also received IV abx initially.  Given blood cultures negative - abx were discontinued.  Still with weakness.  Some balance issues.  Has known neuropathy.  Discussed home health - PT.       Major depressive disorder, single episode, mild (HCC)    Seeing Dr Clovis Riley.  Currently on lexapro and clonazepam.  Denies SI.  Follow.  Continue f/u with psychiatry.       Other fatigue     Recent admission as outlined.  Feels a little stronger.  Still with decreased appetite. Still with weakness.  Some balance issues.  Has known neuropathy.  Discussed home health - PT.       Relevant Orders   Ambulatory referral to Home Health    No follow-ups on file.   I discussed the assessment and treatment plan with the patient. The patient was provided an opportunity to ask questions and all were answered. The patient agreed with the plan and demonstrated  an understanding of the instructions.   The patient was advised to call  back or seek an in-person evaluation if the symptoms worsen or if the condition fails to improve as anticipated.  I provided 25 minutes of non-face-to-face time during this encounter.   [name], MD

## 2022-05-01 NOTE — Assessment & Plan Note (Signed)
2D echo on 09/05/2020 showed EF 60-65% with grade 1 diastolic dysfunction.  BNP 369, chest x-ray showed mild interstitial edema, but no leg edema or JVD. No shortness of breath. Recommended resuming home lasix.

## 2022-05-01 NOTE — Assessment & Plan Note (Signed)
Continue lexapro.  Continue f/u with Dr Clovis Riley

## 2022-05-01 NOTE — Assessment & Plan Note (Signed)
Follow lipid panel.   

## 2022-05-01 NOTE — Assessment & Plan Note (Signed)
No upper symptoms reported.  On protonix.   

## 2022-05-01 NOTE — Assessment & Plan Note (Signed)
On thyroid replacement.  Follow tsh.  

## 2022-05-01 NOTE — Assessment & Plan Note (Signed)
Patient was seen by heme-onc during hospitalization.  Recommended no intervention and follow-up as an outpatient. Hgb decreased to 6.8.  required transfusion.  Oncology consulted.  Pt is following up with Dr. 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 Recent bone marrow biopsy revealed mildly progressive disease with her total blast count of approximately 15%.  Up from 5% in March. Patient was scheduled to start treatment with Vidaza and venetoclax the week of her hospitalization.  Was postponed.  Needs f/u with oncology. Discussed.

## 2022-05-01 NOTE — Assessment & Plan Note (Signed)
Stable

## 2022-05-01 NOTE — Assessment & Plan Note (Signed)
Lab Results  Component Value Date   ALT 17 04/22/2022   AST 12 (L) 04/22/2022   ALKPHOS 83 04/22/2022   BILITOT 0.8 04/22/2022   Within normal limits last check.

## 2022-05-01 NOTE — Assessment & Plan Note (Signed)
Recent admission as outlined.  Treated with tamiflu.  Also received IV abx initially.  Given blood cultures negative - abx were discontinued.  Still with weakness.  Some balance issues.  Has known neuropathy.  Discussed home health - PT.

## 2022-05-01 NOTE — Assessment & Plan Note (Signed)
Mammogram 07/29/21 - Birads II.

## 2022-05-01 NOTE — Assessment & Plan Note (Signed)
Seeing Dr Clovis Riley.  Currently on lexapro and clonazepam.  Denies SI.  Follow.  Continue f/u with psychiatry.

## 2022-05-04 ENCOUNTER — Other Ambulatory Visit: Payer: Self-pay

## 2022-05-04 ENCOUNTER — Emergency Department
Admission: EM | Admit: 2022-05-04 | Discharge: 2022-05-04 | Disposition: A | Discharge status: Home / Self Care | Payer: PPO | Attending: Emergency Medicine | Admitting: Emergency Medicine

## 2022-05-04 DIAGNOSIS — C9200 Acute myeloblastic leukemia, not having achieved remission: Secondary | ICD-10-CM

## 2022-05-04 DIAGNOSIS — Z20822 Contact with and (suspected) exposure to covid-19: Secondary | ICD-10-CM | POA: Diagnosis not present

## 2022-05-04 DIAGNOSIS — K1379 Other lesions of oral mucosa: Secondary | ICD-10-CM | POA: Diagnosis present

## 2022-05-04 DIAGNOSIS — B9789 Other viral agents as the cause of diseases classified elsewhere: Secondary | ICD-10-CM

## 2022-05-04 DIAGNOSIS — K121 Other forms of stomatitis: Secondary | ICD-10-CM | POA: Diagnosis not present

## 2022-05-04 LAB — COMPREHENSIVE METABOLIC PANEL
ALT: 12 U/L (ref 0–44)
AST: 14 U/L — ABNORMAL LOW (ref 15–41)
Albumin: 3.7 g/dL (ref 3.5–5.0)
Alkaline Phosphatase: 67 U/L (ref 38–126)
Anion gap: 12 (ref 5–15)
BUN: 11 mg/dL (ref 8–23)
CO2: 26 mmol/L (ref 22–32)
Calcium: 8.3 mg/dL — ABNORMAL LOW (ref 8.9–10.3)
Chloride: 102 mmol/L (ref 98–111)
Creatinine, Ser: 0.8 mg/dL (ref 0.44–1.00)
GFR, Estimated: >60 mL/min (ref >60)
Glucose, Bld: 130 mg/dL — ABNORMAL HIGH (ref 70–99)
Potassium: 3 mmol/L — ABNORMAL LOW (ref 3.5–5.1)
Sodium: 140 mmol/L (ref 135–145)
Total Bilirubin: 1.3 mg/dL — ABNORMAL HIGH (ref 0.3–1.2)
Total Protein: 7.1 g/dL (ref 6.5–8.1)

## 2022-05-04 LAB — CBC WITH DIFFERENTIAL/PLATELET
Abs Immature Granulocytes: 0 10*3/uL (ref 0.00–0.07)
Basophils Absolute: 0 10*3/uL (ref 0.0–0.1)
Basophils Relative: 0 %
Eosinophils Absolute: 0 10*3/uL (ref 0.0–0.5)
Eosinophils Relative: 0 %
HCT: 22.9 % — ABNORMAL LOW (ref 36.0–46.0)
Hemoglobin: 8.8 g/dL — ABNORMAL LOW (ref 12.0–15.0)
Immature Granulocytes: 0 %
Lymphocytes Relative: 61 %
Lymphs Abs: 0.4 10*3/uL — ABNORMAL LOW (ref 0.7–4.0)
MCH: 37 pg — ABNORMAL HIGH (ref 26.0–34.0)
MCHC: 38.4 g/dL — ABNORMAL HIGH (ref 30.0–36.0)
MCV: 96.2 fL (ref 80.0–100.0)
Monocytes Absolute: 0 10*3/uL — ABNORMAL LOW (ref 0.1–1.0)
Monocytes Relative: 4 %
Neutro Abs: 0.2 10*3/uL — CL (ref 1.7–7.7)
Neutrophils Relative %: 35 %
Platelets: 123 10*3/uL — ABNORMAL LOW (ref 150–400)
RBC: 2.38 MIL/uL — ABNORMAL LOW (ref 3.87–5.11)
RDW: 18.6 % — ABNORMAL HIGH (ref 11.5–15.5)
Smear Review: NORMAL
WBC: 0.6 10*3/uL — CL (ref 4.0–10.5)
nRBC: 0 % (ref 0.0–0.2)

## 2022-05-04 LAB — RESP PANEL BY RT-PCR (RSV, FLU A&B, COVID)  RVPGX2
Influenza A by PCR: NEGATIVE
Influenza B by PCR: NEGATIVE
Resp Syncytial Virus by PCR: NEGATIVE
SARS Coronavirus 2 by RT PCR: NEGATIVE

## 2022-05-04 MED ORDER — VALACYCLOVIR HCL 500 MG PO TABS: 500.0000 mg | ORAL_TABLET | Freq: Every day | ORAL | 0 refills | Status: DC

## 2022-05-04 MED ORDER — MAGIC MOUTHWASH W/LIDOCAINE: 10.0000 mL | Freq: Four times a day (QID) | ORAL | 0 refills | Status: DC | PRN

## 2022-05-04 NOTE — ED Triage Notes (Signed)
Pt presents to the ED via POV due to mouth and nose lesions. Pt has hx of leukemia and was recently admitted to the hospital due to + flu. Pt  also c/o increasing diarrhea. Pt A&Ox4 NAD

## 2022-05-04 NOTE — ED Provider Triage Note (Signed)
Emergency Medicine Provider Triage Evaluation Note  Debra Hale , a 72 y.o. female  was evaluated in triage.  Pt complains of cold sores in her mouth, sent by the nurse line to the ED, patient has history of leukemia, was recently admitted to the hospital, the diarrhea has worsened since then.  Review of Systems  Positive:  Negative:   Physical Exam  BP (!) 120/59 (BP Location: Left Arm)   Pulse (!) 108   Temp 98.4 F (36.9 C) (Oral)   Resp 18   SpO2 100%  Gen:   Awake, no distress   Resp:  Normal effort  MSK:   Moves extremities without difficulty  Other:    Medical Decision Making  Medically screening exam initiated at 12:33 PM.  Appropriate orders placed.  Debra Hale was informed that the remainder of the evaluation will be completed by another provider, this initial triage assessment does not replace that evaluation, and the importance of remaining in the ED until their evaluation is complete.     Versie Starks, PA-C 05/04/22 1234

## 2022-05-04 NOTE — ED Provider Notes (Signed)
[facility] Provider Note    Event Date/Time   First MD Initiated Contact with Patient 05/04/22 1556     (approximate)   History   Mouth Lesions   HPI  Debra Hale is a 72 y.o. female with history of AML not having achieved remission and as listed in the EMR presents to the emergency department for treatment and evaluation of lesions to the nostrils and mouth.  About a month ago she was diagnosed with influenza A.  She states that influenza symptoms have resolved.  No fever or abdominal pain.  Lesions just inside her nostrils and inside her mouth have worsened over the past 24 hours.      Physical Exam   Triage Vital Signs: ED Triage Vitals  Enc Vitals Group     BP 05/04/22 1226 (!) 120/59     Pulse Rate 05/04/22 1226 (!) 108     Resp 05/04/22 1226 18     Temp 05/04/22 1226 98.4 F (36.9 C)     Temp Source 05/04/22 1226 Oral     SpO2 05/04/22 1226 100 %     Weight 05/04/22 1409 133 lb 13.1 oz (60.7 kg)     Height 05/04/22 1409 '5\' 4"'$  (1.626 m)     Head Circumference --      Peak Flow --      Pain Score --      Pain Loc --      Pain Edu? --      Excl. in [place]? --     Most recent vital signs: Vitals:   05/04/22 1226 05/04/22 1619  BP: (!) 120/59 120/66  Pulse: (!) 108 96  Resp: 18 18  Temp: 98.4 F (36.9 C) 98.2 F (36.8 C)  SpO2: 100% 100%     General: Awake, no distress.  CV:  Good peripheral perfusion.  Resp:  Normal effort.  Abd:  No distention.  Other:  Erythematous, not ulcerated or vesicular lesions inside left nare.   ED Results / Procedures / Treatments   Labs (all labs ordered are listed, but only abnormal results are displayed) Labs Reviewed  CBC WITH DIFFERENTIAL/PLATELET - Abnormal; Notable for the following components:      Result Value   WBC 0.6 (*)    RBC 2.38 (*)    Hemoglobin 8.8 (*)    HCT 22.9 (*)    MCH 37.0 (*)    MCHC 38.4 (*)    RDW 18.6 (*)    Platelets 123 (*)    Neutro Abs 0.2 (*)     Lymphs Abs 0.4 (*)    Monocytes Absolute 0.0 (*)    All other components within normal limits  COMPREHENSIVE METABOLIC PANEL - Abnormal; Notable for the following components:   Potassium 3.0 (*)    Glucose, Bld 130 (*)    Calcium 8.3 (*)    AST 14 (*)    Total Bilirubin 1.3 (*)    All other components within normal limits  RESP PANEL BY RT-PCR (RSV, FLU A&B, COVID)  RVPGX2     EKG  Not indicated   RADIOLOGY Not indicated   PROCEDURES:  Critical Care performed: No  Procedures   MEDICATIONS ORDERED IN ED: Medications - No data to display   IMPRESSION / MDM / [place] / ED COURSE  I reviewed the triage vital signs and the nursing notes.  Differential diagnosis includes, but is not limited to, herpes zoster, HSV, viral stomatitis  Patient's presentation is most consistent with acute complicated illness / injury requiring diagnostic workup.  72 year old female undergoing treatment for AML presents to the emergency department for treatment and evaluation of tender lesions inside the left nostril and on gingival surface.  While awaiting ER room assignment, labs were obtained and in comparison to previous labs drawn about 12 days ago are about patient's baseline with the exception of hypokalemia at 3.0.  Patient states that she has been unable to eat normally due to the pain in her mouth.  COVID, influenza, and RSV testing is negative.  Exam is reassuring.  She had been given a prescription for Valtrex in December but was given the option to take it or not.  Patient chose not to take it.  Plan today will be to resubmit the prescription for valacyclovir and provide her with a prescription for Magic mouthwash for stomatitis.  I do not appreciate any ulcerations on the gingival surfaces at this time.  Symptoms are most likely secondary to recent influenza A viral infection.  She will be encouraged to follow-up with her primary care provider  as well as oncologist as scheduled on the fourth.  If any symptom changes or worsens and she is unable to be evaluated sooner, she is to return to the emergency department.    FINAL CLINICAL IMPRESSION(S) / ED DIAGNOSES   Final diagnoses:  Stomatitis, viral     Rx / DC Orders   ED Discharge Orders          Ordered    valACYclovir (VALTREX) 500 MG tablet  Daily        05/04/22 1614    magic mouthwash w/lidocaine SOLN  4 times daily PRN        05/04/22 1614             Note:  This document was prepared using Dragon voice recognition software and may include unintentional dictation errors.   Victorino Dike, FNP 05/04/22 2349    Nathaniel Man, MD 05/09/22 [phone]

## 2022-05-04 NOTE — ED Triage Notes (Signed)
Pt called for triage, no response. 

## 2022-05-07 ENCOUNTER — Inpatient Hospital Stay: Payer: PPO

## 2022-05-07 ENCOUNTER — Inpatient Hospital Stay: Payer: PPO | Admitting: Oncology

## 2022-05-07 ENCOUNTER — Telehealth: Payer: Self-pay | Admitting: Pharmacist

## 2022-05-07 ENCOUNTER — Telehealth: Payer: Self-pay | Admitting: *Deleted

## 2022-05-07 ENCOUNTER — Inpatient Hospital Stay: Payer: PPO | Admitting: Pharmacist

## 2022-05-07 NOTE — Telephone Encounter (Signed)
Oral Chemotherapy Pharmacist Encounter   Patient reported this morning that she does not feel like she can make it in today 05/07/21 for her afternoon appt. Tried to encourage patient to come I'm for evaluation especially considering that she does not feel well. She was adamant that she would not be able to get herself together to come in.  She thinks she will feel up to coming in early next week, Dr. [name] updated and message sent to scheduling to adjust her appt.  Patient was able to let me that she has been taking the valacyclovir because it is in her blister packed medication from the pharmacy. She continued to be confused between the valacyclovir and levofloxacin and kept thinking they were the same thing during out phone conversation. I clarified this several times for her, they are different medications and she should be taking bother at this time.   Darl Pikes, PharmD, BCPS, BCOP, CPP Hematology/Oncology Clinical Pharmacist [place]/DB/AP Oral [facility] [phone]  05/07/2022 2:33 PM

## 2022-05-07 NOTE — Telephone Encounter (Signed)
Oral Chemotherapy Pharmacist Encounter   Patient called and LVM on 05/06/22 stating that she was not feeling well and requesting a call back. Returned call the patient on 05/06/22 and she stated she was not feeling well and did not think she could make it in for her appt on 05/07/22. She recently went to the ED on 05/04/22 for sore in her mouth and in her nose.  She reports that the sore in her mouth are now better but she feels like she has a sinus infection. She only just started the prophylactic levofloxacin that was prescription to her on 04/23/22 a few days ago. She does feel slightly better since starting the levofloxacin. She is unable to tell me if she picked up or is taking the valacyclovir.   Suggested she wait to see how she was feeling tomorrow 05/07/21 before cancelling her appt. I also asked her to call [facility] and confirm if she recently   Patient is a poor historian for both her medicine and past medically history.    Darl Pikes, PharmD, BCPS, BCOP, CPP Hematology/Oncology Clinical Pharmacist [place]/DB/AP Oral [facility] [phone]

## 2022-05-07 NOTE — Telephone Encounter (Signed)
     Patient  visit on 05/04/2022  at [facility] was for flu  Have you been able to follow up with your primary care physician? Yes patient able to follow up if needed   The patient was able to obtain any needed medicine or equipment.  Are there diet recommendations that you are having difficulty following?  Patient expresses understanding of discharge instructions and education provided has no other needs at this time.  yes  [place] [phone] [address] , [place] [address] Email : [email or web address]

## 2022-05-08 ENCOUNTER — Other Ambulatory Visit: Payer: Self-pay | Admitting: Oncology

## 2022-05-11 ENCOUNTER — Inpatient Hospital Stay: Payer: PPO | Admitting: Oncology

## 2022-05-11 ENCOUNTER — Inpatient Hospital Stay: Payer: PPO

## 2022-05-11 ENCOUNTER — Inpatient Hospital Stay: Payer: PPO | Admitting: Pharmacist

## 2022-05-13 ENCOUNTER — Other Ambulatory Visit: Payer: Self-pay | Admitting: *Deleted

## 2022-05-13 DIAGNOSIS — C9200 Acute myeloblastic leukemia, not having achieved remission: Secondary | ICD-10-CM

## 2022-05-14 ENCOUNTER — Other Ambulatory Visit (HOSPITAL_COMMUNITY): Payer: Self-pay

## 2022-05-14 ENCOUNTER — Inpatient Hospital Stay: Payer: PPO | Admitting: Pharmacist

## 2022-05-14 ENCOUNTER — Inpatient Hospital Stay (HOSPITAL_BASED_OUTPATIENT_CLINIC_OR_DEPARTMENT_OTHER): Payer: PPO | Admitting: Oncology

## 2022-05-14 ENCOUNTER — Inpatient Hospital Stay: Payer: PPO | Attending: Oncology

## 2022-05-14 ENCOUNTER — Encounter: Payer: Self-pay | Admitting: Oncology

## 2022-05-14 VITALS — BP 99/60 | HR 94 | Temp 96.8°F | Resp 16 | Ht 64.0 in | Wt 122.0 lb

## 2022-05-14 DIAGNOSIS — Z85038 Personal history of other malignant neoplasm of large intestine: Secondary | ICD-10-CM | POA: Insufficient documentation

## 2022-05-14 DIAGNOSIS — C9200 Acute myeloblastic leukemia, not having achieved remission: Secondary | ICD-10-CM

## 2022-05-14 DIAGNOSIS — Z800 Family history of malignant neoplasm of digestive organs: Secondary | ICD-10-CM | POA: Diagnosis not present

## 2022-05-14 DIAGNOSIS — Z803 Family history of malignant neoplasm of breast: Secondary | ICD-10-CM | POA: Insufficient documentation

## 2022-05-14 DIAGNOSIS — Z9049 Acquired absence of other specified parts of digestive tract: Secondary | ICD-10-CM | POA: Insufficient documentation

## 2022-05-14 DIAGNOSIS — Z85850 Personal history of malignant neoplasm of thyroid: Secondary | ICD-10-CM | POA: Diagnosis not present

## 2022-05-14 DIAGNOSIS — D701 Agranulocytosis secondary to cancer chemotherapy: Secondary | ICD-10-CM | POA: Diagnosis not present

## 2022-05-14 DIAGNOSIS — Z801 Family history of malignant neoplasm of trachea, bronchus and lung: Secondary | ICD-10-CM | POA: Diagnosis not present

## 2022-05-14 DIAGNOSIS — Z853 Personal history of malignant neoplasm of breast: Secondary | ICD-10-CM | POA: Diagnosis not present

## 2022-05-14 DIAGNOSIS — D6959 Other secondary thrombocytopenia: Secondary | ICD-10-CM | POA: Insufficient documentation

## 2022-05-14 LAB — CBC WITH DIFFERENTIAL/PLATELET
Abs Immature Granulocytes: 0 10*3/uL (ref 0.00–0.07)
Basophils Absolute: 0 10*3/uL (ref 0.0–0.1)
Basophils Relative: 0 %
Eosinophils Absolute: 0 10*3/uL (ref 0.0–0.5)
Eosinophils Relative: 0 %
HCT: 25.7 % — ABNORMAL LOW (ref 36.0–46.0)
Hemoglobin: 8.7 g/dL — ABNORMAL LOW (ref 12.0–15.0)
Immature Granulocytes: 0 %
Lymphocytes Relative: 81 %
Lymphs Abs: 0.5 10*3/uL — ABNORMAL LOW (ref 0.7–4.0)
MCH: 30.9 pg (ref 26.0–34.0)
MCHC: 33.9 g/dL (ref 30.0–36.0)
MCV: 91.1 fL (ref 80.0–100.0)
Monocytes Absolute: 0 10*3/uL — ABNORMAL LOW (ref 0.1–1.0)
Monocytes Relative: 5 %
Neutro Abs: 0.1 10*3/uL — CL (ref 1.7–7.7)
Neutrophils Relative %: 14 %
Platelets: 111 10*3/uL — ABNORMAL LOW (ref 150–400)
RBC: 2.82 MIL/uL — ABNORMAL LOW (ref 3.87–5.11)
RDW: 16 % — ABNORMAL HIGH (ref 11.5–15.5)
Smear Review: NORMAL
WBC: 0.6 10*3/uL — CL (ref 4.0–10.5)
nRBC: 0 % (ref 0.0–0.2)

## 2022-05-14 LAB — COMPREHENSIVE METABOLIC PANEL
ALT: 13 U/L (ref 0–44)
AST: 11 U/L — ABNORMAL LOW (ref 15–41)
Albumin: 3.8 g/dL (ref 3.5–5.0)
Alkaline Phosphatase: 68 U/L (ref 38–126)
Anion gap: 10 (ref 5–15)
BUN: 14 mg/dL (ref 8–23)
CO2: 24 mmol/L (ref 22–32)
Calcium: 8.4 mg/dL — ABNORMAL LOW (ref 8.9–10.3)
Chloride: 105 mmol/L (ref 98–111)
Creatinine, Ser: 0.82 mg/dL (ref 0.44–1.00)
GFR, Estimated: >60 mL/min (ref >60)
Glucose, Bld: 119 mg/dL — ABNORMAL HIGH (ref 70–99)
Potassium: 3.8 mmol/L (ref 3.5–5.1)
Sodium: 139 mmol/L (ref 135–145)
Total Bilirubin: 0.6 mg/dL (ref 0.3–1.2)
Total Protein: 6.8 g/dL (ref 6.5–8.1)

## 2022-05-14 MED ORDER — REZLIDHIA 150 MG PO CAPS
1.0000 | ORAL_CAPSULE | Freq: Two times a day (BID) | ORAL | 0 refills | Status: DC
  Filled 2022-05-14: qty 60, fill #0
  Filled 2022-05-15: qty 60, 30d supply, fill #0

## 2022-05-14 NOTE — Progress Notes (Signed)
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 Previously treated with azacitidine and venetoclax, a repeat BM biopsy that showed disease progression.   Reason for Consult: Rezlidhia (olutasidenib) oral chemotherapy education.   PAST MEDICAL HISTORY: Past Medical History:  Diagnosis Date   Abdominal pain 03/06/2016   Acute pericarditis 05/31/2013   Anxiety    Arthritis    Osteoarthritis   Breast cancer ([place]) 2009   right breast lumpectomy with rad tx   Colon cancer ([place])    surgery with chemo and rad tx   Complication of anesthesia    GERD (gastroesophageal reflux disease)    Heart palpitations    History of hiatal hernia    History of kidney stones    History of thyroid cancer 09/18/2008   Qualifier: Diagnosis of   By: Orville Govern CMA, Carol       Hypothyroidism    Liver disease    Liver nodule    s/p negative biopsy   Malignant neoplasm of thyroid gland ([place]) 2002   s/p surgery and XRT   Osteoporosis    Other and unspecified hyperlipidemia    Palpitations    Personal history of chemotherapy    Personal history of malignant neoplasm of large intestine    carcinoma - cecum, s/p right laparoscopic colectomy - s/p chemotherapy and XRT   Personal history of radiation therapy    Pneumonia 2019   PONV (postoperative nausea and vomiting)    Pure hypercholesterolemia    Unspecified hereditary and idiopathic peripheral neuropathy     HEMATOLOGY/ONCOLOGY HISTORY:  Oncology History  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[place])  04/12/2020 Initial Diagnosis   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  MEDICATIONS:  Current Outpatient Medications  Medication Sig Dispense Refill   Azelastine-Fluticasone 137-50 MCG/ACT SUSP SPRAY 2 SPRAYS INTO EACH NOSTRIL EVERY DAY (Patient not taking: Reported on 05/14/2022) 23 g 3   clonazePAM (KLONOPIN) 0.5 MG tablet Take 1 tablet (0.5 mg total) by mouth 3 (three) times daily as needed for anxiety. 30 tablet 0   diphenoxylate-atropine (LOMOTIL) 2.5-0.025 MG tablet Take 1 tablet by mouth 4 (four) times daily as needed for diarrhea or loose stools. (Patient not taking: Reported on 05/14/2022) 60 tablet 1   escitalopram (LEXAPRO) 20 MG tablet Take 20 mg by mouth every morning.      levofloxacin (LEVAQUIN) 500 MG tablet Take 1 tablet (500 mg total) by mouth daily. 30 tablet 0   levothyroxine (SYNTHROID) 125 MCG tablet TAKE 1 TABLET EVERY DAY ON EMPTY STOMACHWITH A GLASS OF WATER AT LEAST 30-60 MINBEFORE BREAKFAST 90 tablet 1   loperamide (IMODIUM) 2 MG capsule Take by mouth.  magic mouthwash w/lidocaine SOLN Take 10 mLs by mouth 4 (four) times daily as needed for mouth pain. 46m diphenhydramine; 342mMaalox; 2057midocaine (Patient not taking: Reported on 05/14/2022) 80 mL 0   metoprolol tartrate (LOPRESSOR) 25 MG tablet TAKE 1 TABLET (25 MG TOTAL) BY MOUTH AS NEEDED (AS NEEDED UP TO TWICE DAILY FOR PALPITATIONS). (Patient not taking: Reported on 04/22/2022) 60 tablet 1   pantoprazole (PROTONIX) 40 MG tablet TAKE ONE  TABLET (40 MG) BY MOUTH EVERY DAY 90 tablet 2   potassium chloride SA (KLOR-CON M) 20 MEQ tablet Take 20 mEq by mouth daily.     prochlorperazine (COMPAZINE) 10 MG tablet Take 1 tablet (10 mg total) by mouth every 6 (six) hours as needed for nausea or vomiting. (Patient not taking: Reported on 05/14/2022) 30 tablet 0   valACYclovir (VALTREX) 500 MG tablet Take 1 tablet (500 mg total) by mouth daily. 30 tablet 0   No current facility-administered medications for this visit.    VITAL SIGNS: There were no vitals taken for this visit. There were no vitals filed for this visit.  Estimated body mass index is 20.94 kg/m as calculated from the following:   Height as of an earlier encounter on 05/14/22: '5\' 4"'$  (1.626 m).   Weight as of an earlier encounter on 05/14/22: 55.3 kg (122 lb).  LABS: CBC:    Component Value Date/Time   WBC 0.6 (LL) 05/14/2022 1343   HGB 8.7 (L) 05/14/2022 1343   HGB 12.5 05/09/2018 1147   HCT 25.7 (L) 05/14/2022 1343   HCT 36.8 05/09/2018 1147   PLT 111 (L) 05/14/2022 1343   PLT 285 05/09/2018 1147   MCV 91.1 05/14/2022 1343   MCV 86 05/09/2018 1147   MCV 89 11/30/2013 1419   NEUTROABS 0.1 (LL) 05/14/2022 1343   NEUTROABS 2.9 11/20/2013 1404   LYMPHSABS 0.5 (L) 05/14/2022 1343   LYMPHSABS 0.9 (L) 11/20/2013 1404   MONOABS 0.0 (L) 05/14/2022 1343   MONOABS 0.2 11/20/2013 1404   EOSABS 0.0 05/14/2022 1343   EOSABS 0.1 11/20/2013 1404   BASOSABS 0.0 05/14/2022 1343   BASOSABS 0.0 11/20/2013 1404   Comprehensive Metabolic Panel:    Component Value Date/Time   NA 139 05/14/2022 1343   NA 143 05/09/2018 1147   NA 145 11/30/2013 1419   K 3.8 05/14/2022 1343   K 3.5 11/30/2013 1419   CL 105 05/14/2022 1343   CL 109 (H) 11/30/2013 1419   CO2 24 05/14/2022 1343   CO2 29 11/30/2013 1419   BUN 14 05/14/2022 1343   BUN 11 05/09/2018 1147   BUN 15 11/30/2013 1419   CREATININE 0.82 05/14/2022 1343   CREATININE 1.04 11/30/2013 1419   GLUCOSE 119 (H) 05/14/2022  1343   GLUCOSE 97 11/30/2013 1419   CALCIUM 8.4 (L) 05/14/2022 1343   CALCIUM 8.7 11/30/2013 1419   AST 11 (L) 05/14/2022 1343   AST 28 11/30/2013 1419   ALT 13 05/14/2022 1343   ALT 71 (H) 11/30/2013 1419   ALKPHOS 68 05/14/2022 1343   ALKPHOS 114 11/30/2013 1419   BILITOT 0.6 05/14/2022 1343   BILITOT 0.7 11/30/2013 1419   PROT 6.8 05/14/2022 1343   PROT 7.1 11/30/2013 1419   ALBUMIN 3.8 05/14/2022 1343   ALBUMIN 3.3 (L) 11/30/2013 1419     Present during today's visit: patient and friend  Start plan: Patient will start once she has medication in hand   Patient Education I spoke with patient for overview of new  oral chemotherapy medication: Olutasidenib    Administration: Counseled patient on administration, dosing, side effects, monitoring, drug-food interactions, safe handling, storage, and disposal. Patient will take 1 capsule (150 mg) by mouth twice daily.   Monitoring needed:  CMP/LFTs: Prior to initiation, at least once weekly for the first 2 months, once every other week for the third month, once in the fourth month, and once every other month thereafter for the duration of therapy   CBC: Prior to initiation, at least once weekly for the first 2 months, once every other week for the third month, once in the fourth month, and once every other month   Side Effects: Side effects include but not limited to: nausea, fatigue, increase in LFT, rash, Differentiation syndrome     Drug-drug Interactions (DDI): No current DDIs with olutasidenib   Adherence: After discussion with patient no patient barriers to medication adherence identified.  Reviewed with patient importance of keeping a medication schedule and plan for any missed doses.  Other:  Patient mentioned having a dry nose in the morning, recommended using a saline nasal spray before bed time Nausea She reports having nausea but not taking anything at home. A prescription for prochlorperazine was sent on  04/22/22 Patient is ensure if she has ondansetron or prochlorperazine at home, I asked her to look around at home them call me to let me know  Ms. Morsch voiced understanding and appreciation. All questions answered. Medication handout provided.  Provided patient with [facility] phone number. Patient knows to call the office with questions or concerns. [facility] will continue to follow.  Patient expressed understanding and was in agreement with this plan. She also understands that She can call clinic at any time with any questions, concerns, or complaints.   Medication Access Issues: PA approved, Rx sent to [facility] (Specialty), patient has grant on hand to cover cost  Follow-up plan: RTC one week after starting for lab monitoring  Thank you for allowing me to participate in the care of this patient.   Time Total: 25 mins  Visit consisted of counseling and education on dealing with issues of symptom management in the setting of serious and potentially life-threatening illness.Greater than 50%  of this time was spent counseling and coordinating care related to the above assessment and plan.  Signed by: Darl Pikes, PharmD, BCPS, Salley Slaughter, CPP Hematology/Oncology Clinical Pharmacist Practitioner [place]/DB/AP [facility] [phone]  05/14/2022 3:17 PM

## 2022-05-14 NOTE — Progress Notes (Signed)
Patient states that since she has been sick with sinus issues, she has had really bad sores in her nose and would like to know how she can treat this.

## 2022-05-15 ENCOUNTER — Other Ambulatory Visit (HOSPITAL_COMMUNITY): Payer: Self-pay

## 2022-05-15 ENCOUNTER — Telehealth: Payer: Self-pay

## 2022-05-15 NOTE — Telephone Encounter (Signed)
Oral Oncology Patient Advocate Encounter   Was successful in securing patient a $5,500 grant from [facility] [place] (PAF) to provide copayment coverage for Rezlidhia.  This will keep the out of pocket expense at $0.     I have spoken with the patient.    The billing information is as follows and has been shared with Melba.   RxBin: [id number] PCN:  [id number] Member ID: [id number] Group ID: [id number] Dates of Eligibility: 11/16/21 through 05/15/23  Berdine Addison, [facility] [place]  [phone] (phone) [phone] (fax) 05/15/2022 1:34 PM

## 2022-05-15 NOTE — Progress Notes (Signed)
[place]  [phone] [phone]) [phone]  ID: Debra Hale OB: 1950-05-23  MR#: [id number]  WLS#:[id number]  Patient Care Team: [name], MD as PCP - General (Internal Medicine) Wellington Hampshire, MD as PCP - Cardiology (Cardiology) Lloyd Huger, MD as Consulting Physician (Oncology)  CHIEF COMPLAINT: AML.  INTERVAL HISTORY: Patient last seen in clinic on March 10, 2022.  In the interim she was admitted to the hospital for influenza and then skipped multiple follow-up appointments.  She returns to clinic today for evaluation and treatment planning for her progressive AML.  She continues to have increased weakness and fatigue.  She denies any recent fevers.  She continues to be highly anxious.  She denies any pain.  She has no neurologic complaints.  She has no chest pain, shortness of breath, cough, or hemoptysis.  She denies any nausea or vomiting.  She has no urinary complaints.  Patient offers no further specific complaints today.  REVIEW OF SYSTEMS:   Review of Systems  Constitutional:  Positive for malaise/fatigue. Negative for fever and weight loss.  HENT:  Negative for congestion and sinus pain.   Respiratory: Negative.  Negative for cough, hemoptysis and shortness of breath.   Cardiovascular: Negative.  Negative for chest pain and leg swelling.  Gastrointestinal: Negative.  Negative for abdominal pain and diarrhea.  Genitourinary: Negative.  Negative for dysuria.  Musculoskeletal: Negative.  Negative for back pain and falls.  Skin: Negative.  Negative for rash.  Neurological:  Positive for weakness. Negative for dizziness, focal weakness and headaches.  Psychiatric/Behavioral:  Negative for memory loss. The patient is nervous/anxious.     As per HPI. Otherwise, a complete review of systems is negative.  PAST MEDICAL HISTORY: Past Medical History:  Diagnosis Date   Abdominal pain 03/06/2016   Acute pericarditis 05/31/2013    Anxiety    Arthritis    Osteoarthritis   Breast cancer ([place]) 2009   right breast lumpectomy with rad tx   Colon cancer ([place])    surgery with chemo and rad tx   Complication of anesthesia    GERD (gastroesophageal reflux disease)    Heart palpitations    History of hiatal hernia    History of kidney stones    History of thyroid cancer 09/18/2008   Qualifier: Diagnosis of   By: Orville Govern, CMA, Carol       Hypothyroidism    Liver disease    Liver nodule    s/p negative biopsy   Malignant neoplasm of thyroid gland ([place]) 2002   s/p surgery and XRT   Osteoporosis    Other and unspecified hyperlipidemia    Palpitations    Personal history of chemotherapy    Personal history of malignant neoplasm of large intestine    carcinoma - cecum, s/p right laparoscopic colectomy - s/p chemotherapy and XRT   Personal history of radiation therapy    Pneumonia 2019   PONV (postoperative nausea and vomiting)    Pure hypercholesterolemia    Unspecified hereditary and idiopathic peripheral neuropathy     PAST SURGICAL HISTORY: Past Surgical History:  Procedure Laterality Date   APPENDECTOMY  1985   BREAST BIOPSY Right 2009   positive   BREAST BIOPSY Right 2009   negative   BREAST LUMPECTOMY Right 2009   breast cancer   CHOLECYSTECTOMY  1995   COLONOSCOPY     COLONOSCOPY WITH PROPOFOL N/A 10/29/2017   Procedure: COLONOSCOPY WITH PROPOFOL;  Surgeon: Manya Silvas, MD;  Location:  [facility];  Service: Endoscopy;  Laterality: N/A;   DILATION AND CURETTAGE OF UTERUS  1990   DILATION AND CURETTAGE, DIAGNOSTIC / THERAPEUTIC  1990   ESOPHAGOGASTRODUODENOSCOPY     ESOPHAGOGASTRODUODENOSCOPY (EGD) WITH PROPOFOL N/A 10/29/2017   Procedure: ESOPHAGOGASTRODUODENOSCOPY (EGD) WITH PROPOFOL;  Surgeon: Manya Silvas, MD;  Location: [facility] END[facility]COPY;  Service: Endoscopy;  Laterality: N/A;   INCISION AND DRAINAGE PERIRECTAL ABSCESS N/A 06/17/2020   Procedure: IRRIGATION AND DEBRIDEMENT PERIRECTAL  ABSCESS;  Surgeon: Jules Husbands, MD;  Location: [facility] ORS;  Service: General;  Laterality: N/A;   IR BONE MARROW BIOPSY & ASPIRATION  07/09/2021   LAPAROSCOPIC PARTIAL COLECTOMY     stage 3-C carcinoma of the cecum, s/p chemotherapy and xrt   LITHOTRIPSY     SIGMOIDOSCOPY  08/26/1993   THYROID LOBECTOMY  2002   s/p XRT   TOTAL HIP ARTHROPLASTY Right 10/24/2019   Procedure: TOTAL HIP ARTHROPLASTY;  Surgeon: Corky Mull, MD;  Location: [facility] ORS;  Service: Orthopedics;  Laterality: Right;    FAMILY HISTORY: Family History  Problem Relation Age of Onset   Stroke Mother        84s   Alzheimer's disease Mother    Lung cancer Father    Prostate cancer Father    Cancer Father        Colon   Colon cancer Father    Breast cancer Sister        30's   Lung cancer Sister    Breast cancer Maternal Aunt     ADVANCED DIRECTIVES (Y/N):  N  HEALTH MAINTENANCE: Social History   Tobacco Use   Smoking status: Never   Smokeless tobacco: Never  Vaping Use   Vaping Use: Never used  Substance Use Topics   Alcohol use: No    Alcohol/week: 0.0 standard drinks of alcohol   Drug use: No     Colonoscopy:  PAP:  Bone density:  Lipid panel:  Allergies  Allergen Reactions   Demeclocycline Other (See Comments)    Throat swells   Sulfa Antibiotics Other (See Comments)    Other reaction(s): Other (See Comments) Throat swells Other reaction(s): Other (See Comments) Throat swells Throat swells Other reaction(s): Other (See Comments) Throat swells Other reaction(s): Unknown Other reaction(s): Other (See Comments) Throat swells Other reaction(s): Other (See Comments) Other reaction(s): Other (See Comments) Throat swellsOther reaction(s): Other (See Comments) Throat swells   Tetracyclines & Related Other (See Comments)    Throat swells    Levofloxacin Diarrhea    Severe diarrhea   Augmentin [Amoxicillin-Pot Clavulanate] Diarrhea   Bentyl [Dicyclomine Hcl]     unkn   Ciprofloxacin  Diarrhea   Codeine Other (See Comments)    dizziness     Dicyclomine Hcl Other (See Comments)    unkn   Epinephrine     Speeds heart up - panic    Flagyl [Metronidazole] Nausea And Vomiting   Librax [Chlordiazepoxide-Clidinium]     unkn   Novocain [Procaine] Other (See Comments)    "Shaky"   Phenobarbital     unkn   Prednisone     Nervous     Ultram [Tramadol] Other (See Comments)    Sick feeling    Current Outpatient Medications  Medication Sig Dispense Refill   clonazePAM (KLONOPIN) 0.5 MG tablet Take 1 tablet (0.5 mg total) by mouth 3 (three) times daily as needed for anxiety. 30 tablet 0   escitalopram (LEXAPRO) 20 MG tablet Take 20 mg by mouth every morning.  levofloxacin (LEVAQUIN) 500 MG tablet Take 1 tablet (500 mg total) by mouth daily. 30 tablet 0   levothyroxine (SYNTHROID) 125 MCG tablet TAKE 1 TABLET EVERY DAY ON EMPTY STOMACHWITH A GLASS OF WATER AT LEAST 30-60 MINBEFORE BREAKFAST 90 tablet 1   loperamide (IMODIUM) 2 MG capsule Take by mouth.     pantoprazole (PROTONIX) 40 MG tablet TAKE ONE TABLET (40 MG) BY MOUTH EVERY DAY 90 tablet 2   potassium chloride SA (KLOR-CON M) 20 MEQ tablet Take 20 mEq by mouth daily.     valACYclovir (VALTREX) 500 MG tablet Take 1 tablet (500 mg total) by mouth daily. 30 tablet 0   Azelastine-Fluticasone 137-50 MCG/ACT SUSP SPRAY 2 SPRAYS INTO EACH NOSTRIL EVERY DAY (Patient not taking: Reported on 05/14/2022) 23 g 3   diphenoxylate-atropine (LOMOTIL) 2.5-0.025 MG tablet Take 1 tablet by mouth 4 (four) times daily as needed for diarrhea or loose stools. (Patient not taking: Reported on 05/14/2022) 60 tablet 1   magic mouthwash w/lidocaine SOLN Take 10 mLs by mouth 4 (four) times daily as needed for mouth pain. 56m diphenhydramine; 348mMaalox; 2058midocaine (Patient not taking: Reported on 05/14/2022) 80 mL 0   metoprolol tartrate (LOPRESSOR) 25 MG tablet TAKE 1 TABLET (25 MG TOTAL) BY MOUTH AS NEEDED (AS NEEDED UP TO TWICE DAILY FOR  PALPITATIONS). (Patient not taking: Reported on 04/22/2022) 60 tablet 1   Olutasidenib (REZLIDHIA) 150 MG CAPS Take 1 tablet by mouth 2 (two) times daily. 60 capsule 0   prochlorperazine (COMPAZINE) 10 MG tablet Take 1 tablet (10 mg total) by mouth every 6 (six) hours as needed for nausea or vomiting. (Patient not taking: Reported on 05/14/2022) 30 tablet 0   No current facility-administered medications for this visit.    OBJECTIVE: Vitals:   05/14/22 1400  BP: 99/60  Pulse: 94  Resp: 16  Temp: (!) 96.8 F (36 C)  SpO2: 100%     Body mass index is 20.94 kg/m.    ECOG FS:1 - Symptomatic but completely ambulatory  General: Well-developed, well-nourished, no acute distress. Eyes: Pink conjunctiva, anicteric sclera. HEENT: Normocephalic, moist mucous membranes. Lungs: No audible wheezing or coughing. Heart: Regular rate and rhythm. Abdomen: Soft, nontender, no obvious distention. Musculoskeletal: No edema, cyanosis, or clubbing. Neuro: Alert, answering all questions appropriately. Cranial nerves grossly intact. Skin: No rashes or petechiae noted. Psych: Normal affect.  LAB RESULTS:  Lab Results  Component Value Date   NA 139 05/14/2022   K 3.8 05/14/2022   CL 105 05/14/2022   CO2 24 05/14/2022   GLUCOSE 119 (H) 05/14/2022   BUN 14 05/14/2022   CREATININE 0.82 05/14/2022   CALCIUM 8.4 (L) 05/14/2022   PROT 6.8 05/14/2022   ALBUMIN 3.8 05/14/2022   AST 11 (L) 05/14/2022   ALT 13 05/14/2022   ALKPHOS 68 05/14/2022   BILITOT 0.6 05/14/2022   GFRNONAA >60 05/14/2022   GFRAA 54 (L) 10/26/2019    Lab Results  Component Value Date   WBC 0.6 (LL) 05/14/2022   NEUTROABS 0.1 (LL) 05/14/2022   HGB 8.7 (L) 05/14/2022   HCT 25.7 (L) 05/14/2022   MCV 91.1 05/14/2022   PLT 111 (L) 05/14/2022     STUDIES: No results found.   ASSESSMENT: AML.  PLAN:    1.  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  Patient's most recent  bone marrow biopsy on March 23, 2022 revealed an increase of her blast count from 4% previously to up to 15% currently.  She last received treatment with cycle 22 of Vidaza on February 13, 2022.  Given her progressive disease and  IDH-1 mutation, will discontinue by Daza and patient will now receive 150 mg twice daily of Olutasidenib.  Return to clinic weekly for laboratory work for the first 2 months and then every other week for 2 months and then monthly thereafter.  Side effects include rash, constipation, diarrhea, nausea, and fatigue.  Patient return to clinic in 2 weeks approximately 1 week after initiating treatment for further evaluation and assess her toleration of Olutasidenib.    2.  Neutropenia: Chronic and unchanged.  Patient has progressive disease.  Treatment as above. 3.  Thrombocytopenia: Platelet count decreased to 111, monitor.   4.  Perirectal abscess: Resolved.   5.  Diarrhea: Patient does not complain of this today.  Continue Lomotil as needed.   6.  Supportive care: Patient previously was given referrals to home health and home palliative care.  Unclear if these services are still active.  Appreciate palliative care input.   7.  Hypokalemia: Resolved.  Continue oral potassium supplementation. 8.  Anemia: Patient's hemoglobin has decreased to 8.7, monitor. 9.  Influenza: Resolved.  10.  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  Patient also received chemotherapy and likely received Adriamycin, but exact regimen is unknown.  She completed 5 years of hormonal therapy in approximately June 2015. Currently, she has no evidence of disease.  Her most recent mammogram on July 29, 2021 was reported  as BI-RADS 2.  Repeat in March 2024. 11.  History of colon cancer: Patient also states that she received chemotherapy for this, possibly FOLFOX but again this is unknown.   Patient expressed understanding and was in agreement with this plan. She also understands that She can call clinic at any time with any questions, concerns, or complaints.    Cancer Staging  History of breast cancer Staging form: Breast, AJCC 7th Edition - Clinical stage from 01/07/2016: Stage IA (T1c, N0, M0) - Signed by Lloyd Huger, MD on 01/07/2016 Laterality: Right Estrogen receptor status: Positive Progesterone receptor status: Positive HER2 status: Negative   Lloyd Huger, MD   05/15/2022 10:12 AM

## 2022-05-18 ENCOUNTER — Other Ambulatory Visit (HOSPITAL_COMMUNITY): Payer: Self-pay

## 2022-05-18 ENCOUNTER — Other Ambulatory Visit: Payer: Self-pay | Admitting: Pharmacist

## 2022-05-18 ENCOUNTER — Other Ambulatory Visit: Payer: Self-pay

## 2022-05-18 DIAGNOSIS — C9200 Acute myeloblastic leukemia, not having achieved remission: Secondary | ICD-10-CM

## 2022-05-18 MED ORDER — PROCHLORPERAZINE MALEATE 10 MG PO TABS: 10.0000 mg | ORAL_TABLET | Freq: Four times a day (QID) | ORAL | 3 refills | Status: DC | PRN

## 2022-05-19 ENCOUNTER — Telehealth: Payer: Self-pay

## 2022-05-19 NOTE — Telephone Encounter (Signed)
Late entry access nurse note from 05/04/22. Pt seen in ED same day.   [place] Night - Cl TELEPHONE ADVICE RECORD AccessNurse Patient Name: Debra Hale Gender: Female DOB: 1950-09-17 Age: 72 Y 32 M 17 D Return Phone Number: [phone] (Primary) Address: City/ State/ Zip: [place]  [address] Client [facility] [place] Night - Cl Client Site [place] Provider [name] - MD Contact Type Call Who Is Calling Patient / Member / Family / Caregiver Call Type Triage / Clinical Relationship To Patient Self Return Phone Number [phone] (Primary) Chief Complaint Runny or Stuffy Nose Reason for Call Symptomatic / Request for [name] states she wants to know if urgent care is open. She also states she had the flu a week ago but she now has sore in her mouth and stuffy nose. [name] Translation No Nurse Assessment Nurse: Debra Guard, RN, Debra Hale Date/Time Debra Hale Time): 05/04/2022 10:45:46 AM Confirm and document reason for call. If symptomatic, describe symptoms. ---States she has sores in her mouth and inside her nose. States sores started 2 days ago. Has nasal drainage and has sob. Was hospitalized with the flu 1 1/2 weeks ago and states sob has been present since in the hospitalized, denies worsening. Also has Leukemia Does the patient have any new or worsening symptoms? ---Yes Will a triage be completed? ---Yes Related visit to physician within the last 2 weeks? ---Yes Does the PT have any chronic conditions? (i.e. diabetes, asthma, this includes High risk factors for pregnancy, etc.) ---Yes List chronic conditions. ---Leukemia Is this a behavioral health or substance abuse call? ---No Guidelines Guideline Title Affirmed Question Affirmed Notes Nurse Date/Time (Eastern Time) Mouth  Ulcers Large blisters in mouth (i.e., fluid filled bubbles or sacs) [name], RN, Debra Hale 05/04/2022 10:50:46 AM PLEASE NOTE: All timestamps contained within this report are represented as [place] Standard Time. CONFIDENTIALTY NOTICE: This fax transmission is intended only for the addressee. It contains information that is legally privileged, confidential or otherwise protected from use or disclosure. If you are not the intended recipient, you are strictly prohibited from reviewing, disclosing, copying using or disseminating any of this information or taking any action in reliance on or regarding this information. If you have received this fax in error, please notify us immediately by telephone so that we can arrange for its return to [place]. Phone: [phone], Toll-Free: [phone], Fax: [phone] Page: 2 of 2 Call Id: [id number] [name]. Time Debra Hale Time) Disposition Final User 05/04/2022 10:52:17 AM See HCP within 4 Hours (or PCP triage) Yes Debra Guard, RN, Debra Hale Final Disposition 05/04/2022 10:52:17 AM See HCP within 4 Hours (or PCP triage) Yes Debra Guard, RN, [name] Disagree/Comply Comply Caller Understands Yes PreDisposition Search internet for information Care Advice Given Per Guideline SEE HCP (OR PCP TRIAGE) WITHIN 4 HOURS: * IF OFFICE WILL BE CLOSED AND NO PCP (PRIMARY CARE PROVIDER) SECOND-LEVEL TRIAGE: You need to be seen within the next 3 or 4 hours. A nearby Urgent Care Center [facility]) is often a good source of care. Another choice is to go to the ED. Go sooner if you become worse. CALL BACK IF: * You become worse CARE ADVICE given per Mouth Ulcers (Adult) guideline. Referrals GO TO FACILITY OTHER - SPECIFY

## 2022-05-21 ENCOUNTER — Telehealth: Payer: Self-pay | Admitting: Pharmacist

## 2022-05-21 NOTE — Telephone Encounter (Signed)
Oral Chemotherapy Pharmacist Encounter   Received a call from Ms. Hoheisel stating that she went to the dentist and she has a tooth infection. They were wanting to start her on medication and a prescription mouthwash and wanted oncology to sign-off on the medications. Ms. Goya was unable to tell me what medications they were wanting to start. She is suppose to follow-up with the dental office and have them give use a call.  She also reported not starting her olutasidenib yet. She said she wanted to hold on starting until her dental infection was better. Because she has not yet started, her clinic f/u appt for next week has been cancelled, and scheduling will move her appt to the week of 06/01/22   Darl Pikes, PharmD, BCPS, BCOP, CPP Hematology/Oncology Clinical Pharmacist [place]/DB/AP Oral [facility] [phone]  05/21/2022 10:34 AM

## 2022-05-22 ENCOUNTER — Other Ambulatory Visit (HOSPITAL_COMMUNITY): Payer: Self-pay

## 2022-05-22 NOTE — Telephone Encounter (Signed)
Spoke to Debra Hale's dental office, they are wanting to start her amoxicillin x7 days and peridox mouthwash, both are okay. While taking the amoxicillin, [name] was instructed to hold her levofloxacin and resume once the amoxicillin has stopped.

## 2022-05-25 ENCOUNTER — Other Ambulatory Visit: Payer: PPO

## 2022-05-26 ENCOUNTER — Inpatient Hospital Stay: Payer: PPO | Admitting: Oncology

## 2022-05-26 ENCOUNTER — Inpatient Hospital Stay: Payer: PPO

## 2022-05-26 ENCOUNTER — Ambulatory Visit: Payer: PPO | Admitting: Pharmacist

## 2022-05-26 ENCOUNTER — Ambulatory Visit: Payer: PPO | Admitting: Oncology

## 2022-05-26 ENCOUNTER — Inpatient Hospital Stay: Payer: PPO | Admitting: Pharmacist

## 2022-05-27 ENCOUNTER — Other Ambulatory Visit (HOSPITAL_COMMUNITY): Payer: Self-pay

## 2022-05-27 ENCOUNTER — Telehealth: Payer: Self-pay | Admitting: Pharmacist

## 2022-05-27 NOTE — Telephone Encounter (Signed)
Patient called to report still not starting her Rezlidhia, due to on going dental infection and not being able to eat much. Message was sent to scheduling to push her appt from 1/29 to the week of 2/5.

## 2022-06-01 ENCOUNTER — Inpatient Hospital Stay: Payer: PPO | Admitting: Oncology

## 2022-06-01 ENCOUNTER — Ambulatory Visit: Payer: PPO | Admitting: Oncology

## 2022-06-01 ENCOUNTER — Ambulatory Visit: Payer: PPO

## 2022-06-01 ENCOUNTER — Inpatient Hospital Stay: Payer: PPO | Admitting: Pharmacist

## 2022-06-01 ENCOUNTER — Inpatient Hospital Stay: Payer: PPO

## 2022-06-01 ENCOUNTER — Other Ambulatory Visit: Payer: PPO

## 2022-06-02 ENCOUNTER — Telehealth: Payer: Self-pay | Admitting: Pharmacist

## 2022-06-02 NOTE — Telephone Encounter (Signed)
Oral Chemotherapy Pharmacist Encounter   Patient called to report taking her first dose of olutasidenib (Rezlidhia) this morning 06/02/22. Reviewed with patient dosing, administration, and side effect/side effect management.   Reminded patient of her upcoming f/u appt on 06/08/22.  Darl Pikes, PharmD, BCPS, BCOP, CPP Hematology/Oncology Clinical Pharmacist [place]/DB/AP [facility] [phone]  06/02/2022 11:42 AM

## 2022-06-05 ENCOUNTER — Other Ambulatory Visit: Payer: Self-pay | Admitting: *Deleted

## 2022-06-05 DIAGNOSIS — C9200 Acute myeloblastic leukemia, not having achieved remission: Secondary | ICD-10-CM

## 2022-06-08 ENCOUNTER — Inpatient Hospital Stay (HOSPITAL_BASED_OUTPATIENT_CLINIC_OR_DEPARTMENT_OTHER): Payer: PPO | Admitting: Oncology

## 2022-06-08 ENCOUNTER — Inpatient Hospital Stay: Payer: PPO | Attending: Oncology

## 2022-06-08 ENCOUNTER — Inpatient Hospital Stay: Payer: PPO | Admitting: Pharmacist

## 2022-06-08 ENCOUNTER — Encounter: Payer: Self-pay | Admitting: Oncology

## 2022-06-08 VITALS — BP 121/65 | HR 80 | Temp 97.0°F | Resp 16 | Ht 64.0 in | Wt 119.0 lb

## 2022-06-08 DIAGNOSIS — R110 Nausea: Secondary | ICD-10-CM | POA: Diagnosis not present

## 2022-06-08 DIAGNOSIS — Z79899 Other long term (current) drug therapy: Secondary | ICD-10-CM | POA: Insufficient documentation

## 2022-06-08 DIAGNOSIS — Z79624 Long term (current) use of inhibitors of nucleotide synthesis: Secondary | ICD-10-CM | POA: Diagnosis not present

## 2022-06-08 DIAGNOSIS — Z9049 Acquired absence of other specified parts of digestive tract: Secondary | ICD-10-CM | POA: Insufficient documentation

## 2022-06-08 DIAGNOSIS — Z923 Personal history of irradiation: Secondary | ICD-10-CM | POA: Insufficient documentation

## 2022-06-08 DIAGNOSIS — Z853 Personal history of malignant neoplasm of breast: Secondary | ICD-10-CM | POA: Diagnosis not present

## 2022-06-08 DIAGNOSIS — Z85850 Personal history of malignant neoplasm of thyroid: Secondary | ICD-10-CM | POA: Diagnosis not present

## 2022-06-08 DIAGNOSIS — Z9221 Personal history of antineoplastic chemotherapy: Secondary | ICD-10-CM | POA: Insufficient documentation

## 2022-06-08 DIAGNOSIS — Z85038 Personal history of other malignant neoplasm of large intestine: Secondary | ICD-10-CM | POA: Diagnosis not present

## 2022-06-08 DIAGNOSIS — E876 Hypokalemia: Secondary | ICD-10-CM | POA: Insufficient documentation

## 2022-06-08 DIAGNOSIS — C9200 Acute myeloblastic leukemia, not having achieved remission: Secondary | ICD-10-CM

## 2022-06-08 LAB — CBC WITH DIFFERENTIAL/PLATELET
Abs Immature Granulocytes: 0.05 10*3/uL (ref 0.00–0.07)
Basophils Absolute: 0 10*3/uL (ref 0.0–0.1)
Basophils Relative: 0 %
Eosinophils Absolute: 0 10*3/uL (ref 0.0–0.5)
Eosinophils Relative: 1 %
HCT: 25.7 % — ABNORMAL LOW (ref 36.0–46.0)
Hemoglobin: 8.1 g/dL — ABNORMAL LOW (ref 12.0–15.0)
Immature Granulocytes: 5 %
Lymphocytes Relative: 43 %
Lymphs Abs: 0.4 10*3/uL — ABNORMAL LOW (ref 0.7–4.0)
MCH: 28.2 pg (ref 26.0–34.0)
MCHC: 31.5 g/dL (ref 30.0–36.0)
MCV: 89.5 fL (ref 80.0–100.0)
Monocytes Absolute: 0.1 10*3/uL (ref 0.1–1.0)
Monocytes Relative: 15 %
Neutro Abs: 0.3 10*3/uL — CL (ref 1.7–7.7)
Neutrophils Relative %: 36 %
Platelets: 138 10*3/uL — ABNORMAL LOW (ref 150–400)
RBC: 2.87 MIL/uL — ABNORMAL LOW (ref 3.87–5.11)
RDW: 16.8 % — ABNORMAL HIGH (ref 11.5–15.5)
Smear Review: NORMAL
WBC: 0.9 10*3/uL — CL (ref 4.0–10.5)
nRBC: 0 % (ref 0.0–0.2)

## 2022-06-08 LAB — COMPREHENSIVE METABOLIC PANEL
ALT: 14 U/L (ref 0–44)
AST: 16 U/L (ref 15–41)
Albumin: 3.9 g/dL (ref 3.5–5.0)
Alkaline Phosphatase: 72 U/L (ref 38–126)
Anion gap: 10 (ref 5–15)
BUN: 11 mg/dL (ref 8–23)
CO2: 25 mmol/L (ref 22–32)
Calcium: 8.4 mg/dL — ABNORMAL LOW (ref 8.9–10.3)
Chloride: 105 mmol/L (ref 98–111)
Creatinine, Ser: 0.88 mg/dL (ref 0.44–1.00)
GFR, Estimated: >60 mL/min (ref >60)
Glucose, Bld: 126 mg/dL — ABNORMAL HIGH (ref 70–99)
Potassium: 3.1 mmol/L — ABNORMAL LOW (ref 3.5–5.1)
Sodium: 140 mmol/L (ref 135–145)
Total Bilirubin: 0.8 mg/dL (ref 0.3–1.2)
Total Protein: 6.8 g/dL (ref 6.5–8.1)

## 2022-06-08 MED ORDER — ONDANSETRON HCL 4 MG PO TABS: 4.0000 mg | ORAL_TABLET | Freq: Two times a day (BID) | ORAL | 2 refills | Status: DC

## 2022-06-08 NOTE — Progress Notes (Signed)
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  Reason for Consult: Oral chemotherapy follow-up for Rezlidhia (olutasidenib) therapy.   PAST MEDICAL HISTORY: Past Medical History:  Diagnosis Date   Abdominal pain 03/06/2016   Acute pericarditis 05/31/2013   Anxiety    Arthritis    Osteoarthritis   Breast cancer ([place]) 2009   right breast lumpectomy with rad tx   Colon cancer ([place])    surgery with chemo and rad tx   Complication of anesthesia    GERD (gastroesophageal reflux disease)    Heart palpitations    History of hiatal hernia    History of kidney stones    History of thyroid cancer 09/18/2008   Qualifier: Diagnosis of   By: Orville Govern CMA, Carol       Hypothyroidism    Liver disease    Liver nodule    s/p negative biopsy   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   s/p surgery and XRT   Osteoporosis    Other and unspecified hyperlipidemia    Palpitations    Personal history of chemotherapy    Personal history of malignant neoplasm of large intestine    carcinoma - cecum, s/p right laparoscopic colectomy - s/p chemotherapy and XRT   Personal history of radiation therapy    Pneumonia 2019   PONV (postoperative nausea and vomiting)    Pure hypercholesterolemia    Unspecified hereditary and idiopathic peripheral neuropathy     HEMATOLOGY/ONCOLOGY HISTORY:  Oncology History  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[place])  04/12/2020 Initial Diagnosis   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  MEDICATIONS:  Current Outpatient Medications  Medication Sig Dispense Refill   Azelastine-Fluticasone 137-50 MCG/ACT SUSP SPRAY 2 SPRAYS INTO EACH NOSTRIL EVERY DAY (Patient not taking: Reported on 05/14/2022) 23 g 3   clonazePAM (KLONOPIN) 0.5 MG tablet Take 1 tablet (0.5 mg total) by mouth 3 (three) times daily as needed for anxiety. 30 tablet 0   diphenoxylate-atropine (LOMOTIL) 2.5-0.025 MG tablet Take 1 tablet by mouth 4 (four) times daily as needed for diarrhea or loose stools. (Patient not taking: Reported on 05/14/2022) 60 tablet 1   escitalopram (LEXAPRO) 20 MG tablet Take 20 mg by mouth every morning.      levofloxacin (LEVAQUIN) 500 MG tablet Take 1 tablet (500 mg total) by mouth daily. 30 tablet 0   levothyroxine (SYNTHROID) 125 MCG tablet TAKE 1 TABLET EVERY DAY ON EMPTY STOMACHWITH A GLASS OF WATER AT LEAST 30-60 MINBEFORE BREAKFAST 90 tablet 1  loperamide (IMODIUM) 2 MG capsule Take by mouth.     magic mouthwash w/lidocaine SOLN Take 10 mLs by mouth 4 (four) times daily as needed for mouth pain. 48m diphenhydramine; 333mMaalox; 209midocaine (Patient not taking: Reported on 05/14/2022) 80 mL 0   metoprolol tartrate (LOPRESSOR) 25 MG tablet TAKE 1 TABLET (25 MG TOTAL) BY MOUTH AS NEEDED (AS NEEDED UP TO TWICE DAILY FOR  PALPITATIONS). (Patient not taking: Reported on 04/22/2022) 60 tablet 1   Olutasidenib (REZLIDHIA) 150 MG CAPS Take 1 tablet by mouth 2 (two) times daily. 60 capsule 0   pantoprazole (PROTONIX) 40 MG tablet TAKE ONE TABLET (40 MG) BY MOUTH EVERY DAY 90 tablet 2   potassium chloride SA (KLOR-CON M) 20 MEQ tablet Take 20 mEq by mouth daily.     prochlorperazine (COMPAZINE) 10 MG tablet Take 1 tablet (10 mg total) by mouth every 6 (six) hours as needed for nausea or vomiting. 30 tablet 3   valACYclovir (VALTREX) 500 MG tablet Take 1 tablet (500 mg total) by mouth daily. 30 tablet 0   No current facility-administered medications for this visit.    VITAL SIGNS: There were no vitals taken for this visit. There were no vitals filed for this visit.  Estimated body mass index is 20.43 kg/m as calculated from the following:   Height as of an earlier encounter on 06/08/22: '5\' 4"'$  (1.626 m).   Weight as of an earlier encounter on 06/08/22: 54 kg (119 lb).  LABS: CBC:    Component Value Date/Time   WBC 0.9 (LL) 06/08/2022 0949   HGB 8.1 (L) 06/08/2022 0949   HGB 12.5 05/09/2018 1147   HCT 25.7 (L) 06/08/2022 0949   HCT 36.8 05/09/2018 1147   PLT 138 (L) 06/08/2022 0949   PLT 285 05/09/2018 1147   MCV 89.5 06/08/2022 0949   MCV 86 05/09/2018 1147   MCV 89 11/30/2013 1419   NEUTROABS 0.3 (LL) 06/08/2022 0949   NEUTROABS 2.9 11/20/2013 1404   LYMPHSABS 0.4 (L) 06/08/2022 0949   LYMPHSABS 0.9 (L) 11/20/2013 1404   MONOABS 0.1 06/08/2022 0949   MONOABS 0.2 11/20/2013 1404   EOSABS 0.0 06/08/2022 0949   EOSABS 0.1 11/20/2013 1404   BASOSABS 0.0 06/08/2022 0949   BASOSABS 0.0 11/20/2013 1404   Comprehensive Metabolic Panel:    Component Value Date/Time   NA 140 06/08/2022 0949   NA 143 05/09/2018 1147   NA 145 11/30/2013 1419   K 3.1 (L) 06/08/2022 0949   K 3.5 11/30/2013 1419   CL 105 06/08/2022 0949   CL 109 (H) 11/30/2013 1419   CO2 25 06/08/2022 0949   CO2 29 11/30/2013 1419   BUN  11 06/08/2022 0949   BUN 11 05/09/2018 1147   BUN 15 11/30/2013 1419   CREATININE 0.88 06/08/2022 0949   CREATININE 1.04 11/30/2013 1419   GLUCOSE 126 (H) 06/08/2022 0949   GLUCOSE 97 11/30/2013 1419   CALCIUM 8.4 (L) 06/08/2022 0949   CALCIUM 8.7 11/30/2013 1419   AST 16 06/08/2022 0949   AST 28 11/30/2013 1419   ALT 14 06/08/2022 0949   ALT 71 (H) 11/30/2013 1419   ALKPHOS 72 06/08/2022 0949   ALKPHOS 114 11/30/2013 1419   BILITOT 0.8 06/08/2022 0949   BILITOT 0.7 11/30/2013 1419   PROT 6.8 06/08/2022 0949   PROT 7.1 11/30/2013 1419   ALBUMIN 3.9 06/08/2022 0949   ALBUMIN 3.3 (L) 11/30/2013 1419     Present during today's visit: patient only  Assessment and Plan: CMP/CBC reviewed, continue olutasidenib '150mg'$  twice daily Start taking ondansetron '4mg'$  twice daily prior to olutasidenib  to help with nausea prevention  Patient will continue with weekly lab checks   Oral Chemotherapy Side Effect/Intolerance:  Nausea: patient reported feeling nausea but is not really taking her anti-nausea prochlorperazine. Recommended scheduled ondansetron (see above) Diarrhea unchanged and no reported rash  Oral Chemotherapy Adherence: patient reported not taking her olutasidenib yesterday because she felt bad and was nauseous. She also reports maybe missing a few other doses over the last week One patient barriers to medication adherence identified, nausea. Hopefully scheduled ondansetron will get a handle on the nausea and allow her to take her medication as scheduled  New medications: None reported  Medication Access Issues: No issues, patient fills at [facility] (Specialty[facility]  Patient expressed understanding and was in agreement with this plan. She also understands that She can call clinic at any time with any questions, concerns, or complaints.   Follow-up plan: RTC in one week labs, and two weeks lab/MD/Pharm  Thank you for allowing me to participate in the care of this very  pleasant patient.   Time Total: 15 mins  Visit consisted of counseling and education on dealing with issues of symptom management in the setting of serious and potentially life-threatening illness.Greater than 50%  of this time was spent counseling and coordinating care related to the above assessment and plan.  Signed by: Darl Pikes, PharmD, BCPS, Salley Slaughter, CPP Hematology/Oncology Clinical Pharmacist Practitioner [place]/DB/AP [facility] [phone]  06/08/2022 1:38 PM

## 2022-06-08 NOTE — Progress Notes (Signed)
Debra Hale is causing her to feel very sick. States she has no appetite and has nausea. Needs something different for nausea. States compazine and zofran do not work for her. Skipped chemo pill yesterday due to not being able to eat and that helped her be able to eat some.

## 2022-06-08 NOTE — Progress Notes (Signed)
[place]  [phone]) [phone] Fax:(336) [phone]  ID: Debra Hale OB: 07/07/1950  MR#: [id number]  CSN#:[id number]  Patient Care Team: [name], MD as PCP - General (Internal Medicine) Wellington Hampshire, MD as PCP - Cardiology (Cardiology) Lloyd Huger, MD as Consulting Physician (Oncology)  CHIEF COMPLAINT: AML.  INTERVAL HISTORY: Patient returns to clinic today for further evaluation and to assess her toleration of Rezlidhia.  She has increased nausea and decreased appetite, but otherwise is tolerating her treatments well.  She has no further fevers.  She does not complain of weakness or fatigue today.  She continues to be highly anxious.  She denies any pain.  She has no neurologic complaints.  She has no chest pain, shortness of breath, cough, or hemoptysis.  She denies any nausea or vomiting.  She has no urinary complaints.  Patient offers no further specific complaints today.  REVIEW OF SYSTEMS:   Review of Systems  Constitutional:  Positive for malaise/fatigue. Negative for fever and weight loss.  HENT:  Negative for congestion and sinus pain.   Respiratory: Negative.  Negative for cough, hemoptysis and shortness of breath.   Cardiovascular: Negative.  Negative for chest pain and leg swelling.  Gastrointestinal:  Positive for nausea. Negative for abdominal pain and diarrhea.  Genitourinary: Negative.  Negative for dysuria.  Musculoskeletal: Negative.  Negative for back pain and falls.  Skin: Negative.  Negative for rash.  Neurological:  Positive for weakness. Negative for dizziness, focal weakness and headaches.  Psychiatric/Behavioral:  Negative for memory loss. The patient is nervous/anxious.     As per HPI. Otherwise, a complete review of systems is negative.  PAST MEDICAL HISTORY: Past Medical History:  Diagnosis Date   Abdominal pain 03/06/2016   Acute pericarditis 05/31/2013   Anxiety    Arthritis    Osteoarthritis    Breast cancer ([place]) 2009   right breast lumpectomy with rad tx   Colon cancer ([place])    surgery with chemo and rad tx   Complication of anesthesia    GERD (gastroesophageal reflux disease)    Heart palpitations    History of hiatal hernia    History of kidney stones    History of thyroid cancer 09/18/2008   Qualifier: Diagnosis of   By: Orville Govern, CMA, Carol       Hypothyroidism    Liver disease    Liver nodule    s/p negative biopsy   Malignant neoplasm of thyroid gland ([place]) 2002   s/p surgery and XRT   Osteoporosis    Other and unspecified hyperlipidemia    Palpitations    Personal history of chemotherapy    Personal history of malignant neoplasm of large intestine    carcinoma - cecum, s/p right laparoscopic colectomy - s/p chemotherapy and XRT   Personal history of radiation therapy    Pneumonia 2019   PONV (postoperative nausea and vomiting)    Pure hypercholesterolemia    Unspecified hereditary and idiopathic peripheral neuropathy     PAST SURGICAL HISTORY: Past Surgical History:  Procedure Laterality Date   APPENDECTOMY  1985   BREAST BIOPSY Right 2009   positive   BREAST BIOPSY Right 2009   negative   BREAST LUMPECTOMY Right 2009   breast cancer   CHOLECYSTECTOMY  1995   COLONOSCOPY     COLONOSCOPY WITH PROPOFOL N/A 10/29/2017   Procedure: COLONOSCOPY WITH PROPOFOL;  Surgeon: Manya Silvas, MD;  Location: [facility] ENDOSCOPY;  Service: Endoscopy;  Laterality: N/A;  DILATION AND CURETTAGE OF UTERUS  1990   DILATION AND CURETTAGE, DIAGNOSTIC / THERAPEUTIC  1990   ESOPHAGOGASTRODUODENOSCOPY     ESOPHAGOGASTRODUODENOSCOPY (EGD) WITH PROPOFOL N/A 10/29/2017   Procedure: ESOPHAGOGASTRODUODENOSCOPY (EGD) WITH PROPOFOL;  Surgeon: Manya Silvas, MD;  Location: [facility] ENDOSCOPY;  Service: Endoscopy;  Laterality: N/A;   INCISION AND DRAINAGE PERIRECTAL ABSCESS N/A 06/17/2020   Procedure: IRRIGATION AND DEBRIDEMENT PERIRECTAL ABSCESS;  Surgeon: Jules Husbands, MD;  Location:  [facility] ORS;  Service: General;  Laterality: N/A;   IR BONE MARROW BIOPSY & ASPIRATION  07/09/2021   LAPAROSCOPIC PARTIAL COLECTOMY     stage 3-C carcinoma of the cecum, s/p chemotherapy and xrt   LITHOTRIPSY     SIGMOIDOSCOPY  08/26/1993   THYROID LOBECTOMY  2002   s/p XRT   TOTAL HIP ARTHROPLASTY Right 10/24/2019   Procedure: TOTAL HIP ARTHROPLASTY;  Surgeon: Corky Mull, MD;  Location: [facility] ORS;  Service: Orthopedics;  Laterality: Right;    FAMILY HISTORY: Family History  Problem Relation Age of Onset   Stroke Mother        45s   Alzheimer's disease Mother    Lung cancer Father    Prostate cancer Father    Cancer Father        Colon   Colon cancer Father    Breast cancer Sister        54's   Lung cancer Sister    Breast cancer Maternal Aunt     ADVANCED DIRECTIVES (Y/N):  N  HEALTH MAINTENANCE: Social History   Tobacco Use   Smoking status: Never   Smokeless tobacco: Never  Vaping Use   Vaping Use: Never used  Substance Use Topics   Alcohol use: No    Alcohol/week: 0.0 standard drinks of alcohol   Drug use: No     Colonoscopy:  PAP:  Bone density:  Lipid panel:  Allergies  Allergen Reactions   Demeclocycline Other (See Comments)    Throat swells   Sulfa Antibiotics Other (See Comments)    Other reaction(s): Other (See Comments) Throat swells Other reaction(s): Other (See Comments) Throat swells Throat swells Other reaction(s): Other (See Comments) Throat swells Other reaction(s): Unknown Other reaction(s): Other (See Comments) Throat swells Other reaction(s): Other (See Comments) Other reaction(s): Other (See Comments) Throat swellsOther reaction(s): Other (See Comments) Throat swells   Tetracyclines & Related Other (See Comments)    Throat swells    Levofloxacin Diarrhea    Severe diarrhea   Augmentin [Amoxicillin-Pot Clavulanate] Diarrhea   Bentyl [Dicyclomine Hcl]     unkn   Ciprofloxacin Diarrhea   Codeine Other (See Comments)     dizziness     Dicyclomine Hcl Other (See Comments)    unkn   Epinephrine     Speeds heart up - panic    Flagyl [Metronidazole] Nausea And Vomiting   Librax [Chlordiazepoxide-Clidinium]     unkn   Novocain [Procaine] Other (See Comments)    "Shaky"   Phenobarbital     unkn   Prednisone     Nervous     Ultram [Tramadol] Other (See Comments)    Sick feeling    Current Outpatient Medications  Medication Sig Dispense Refill   Azelastine-Fluticasone 137-50 MCG/ACT SUSP SPRAY 2 SPRAYS INTO EACH NOSTRIL EVERY DAY (Patient not taking: Reported on 05/14/2022) 23 g 3   clonazePAM (KLONOPIN) 0.5 MG tablet Take 1 tablet (0.5 mg total) by mouth 3 (three) times daily as needed for anxiety. 30 tablet 0  diphenoxylate-atropine (LOMOTIL) 2.5-0.025 MG tablet Take 1 tablet by mouth 4 (four) times daily as needed for diarrhea or loose stools. (Patient not taking: Reported on 05/14/2022) 60 tablet 1   escitalopram (LEXAPRO) 20 MG tablet Take 20 mg by mouth every morning.      levofloxacin (LEVAQUIN) 500 MG tablet Take 1 tablet (500 mg total) by mouth daily. 30 tablet 0   levothyroxine (SYNTHROID) 125 MCG tablet TAKE 1 TABLET EVERY DAY ON EMPTY STOMACHWITH A GLASS OF WATER AT LEAST 30-60 MINBEFORE BREAKFAST 90 tablet 1   loperamide (IMODIUM) 2 MG capsule Take by mouth.     magic mouthwash w/lidocaine SOLN Take 10 mLs by mouth 4 (four) times daily as needed for mouth pain. 32m diphenhydramine; 372mMaalox; 2087midocaine (Patient not taking: Reported on 05/14/2022) 80 mL 0   metoprolol tartrate (LOPRESSOR) 25 MG tablet TAKE 1 TABLET (25 MG TOTAL) BY MOUTH AS NEEDED (AS NEEDED UP TO TWICE DAILY FOR PALPITATIONS). (Patient not taking: Reported on 04/22/2022) 60 tablet 1   Olutasidenib (REZLIDHIA) 150 MG CAPS Take 1 tablet by mouth 2 (two) times daily. 60 capsule 0   pantoprazole (PROTONIX) 40 MG tablet TAKE ONE TABLET (40 MG) BY MOUTH EVERY DAY 90 tablet 2   potassium chloride SA (KLOR-CON M) 20 MEQ tablet  Take 20 mEq by mouth daily.     prochlorperazine (COMPAZINE) 10 MG tablet Take 1 tablet (10 mg total) by mouth every 6 (six) hours as needed for nausea or vomiting. 30 tablet 3   valACYclovir (VALTREX) 500 MG tablet Take 1 tablet (500 mg total) by mouth daily. 30 tablet 0   No current facility-administered medications for this visit.    OBJECTIVE: Vitals:   06/08/22 0955  BP: 121/65  Pulse: 80  Resp: 16  Temp: (!) 97 F (36.1 C)  SpO2: 100%     Body mass index is 20.43 kg/m.    ECOG FS:1 - Symptomatic but completely ambulatory  General: Well-developed, well-nourished, no acute distress. Eyes: Pink conjunctiva, anicteric sclera. HEENT: Normocephalic, moist mucous membranes. Lungs: No audible wheezing or coughing. Heart: Regular rate and rhythm. Abdomen: Soft, nontender, no obvious distention. Musculoskeletal: No edema, cyanosis, or clubbing. Neuro: Alert, answering all questions appropriately. Cranial nerves grossly intact. Skin: No rashes or petechiae noted. Psych: Normal affect.  LAB RESULTS:  Lab Results  Component Value Date   NA 140 06/08/2022   K 3.1 (L) 06/08/2022   CL 105 06/08/2022   CO2 25 06/08/2022   GLUCOSE 126 (H) 06/08/2022   BUN 11 06/08/2022   CREATININE 0.88 06/08/2022   CALCIUM 8.4 (L) 06/08/2022   PROT 6.8 06/08/2022   ALBUMIN 3.9 06/08/2022   AST 16 06/08/2022   ALT 14 06/08/2022   ALKPHOS 72 06/08/2022   BILITOT 0.8 06/08/2022   GFRNONAA >60 06/08/2022   GFRAA 54 (L) 10/26/2019    Lab Results  Component Value Date   WBC 0.9 (LL) 06/08/2022   NEUTROABS 0.3 (LL) 06/08/2022   HGB 8.1 (L) 06/08/2022   HCT 25.7 (L) 06/08/2022   MCV 89.5 06/08/2022   PLT 138 (L) 06/08/2022     STUDIES: No results found.   ASSESSMENT: AML.  PLAN:    AML: Confirmed by bone marrow biopsy.  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   Patient's most recent bone marrow biopsy on March 23, 2022 revealed an increase of her blast count  from 4% previously to up to 15% currently.  She last received treatment with cycle 22 of Vidaza on February 13, 2022.  Given her progressive disease and  IDH-1 mutation, Vidaza has been discontinued and patient will now receive 150 mg twice daily of Olutasidenib (Rezlidhia).  Return to clinic in 1 week for laboratory work only and then in 2 weeks for laboratory work and further evaluation.  Side effects include rash, constipation, diarrhea, nausea, and fatigue.     Neutropenia: Chronic and unchanged.  Patient has progressive disease.  Treatment as above. Thrombocytopenia: Platelet count slightly improved to 138.   Diarrhea: Patient does not complain of this today.  Continue Lomotil as needed.   Supportive care: Patient previously was given referrals to home health and home palliative care.  Unclear if these services are still active.  Appreciate palliative care input.   Hypokalemia: Potassium has declined to 3.1.  Continue oral potassium supplementation. Anemia: Hemoglobin has trended down to 8.1, monitor.  Influenza: Resolved. Nausea: Patient has been instructed to take scheduled ondansetron twice per day. 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  Patient also received chemotherapy and likely received Adriamycin, but exact regimen is unknown.  She completed 5 years of hormonal therapy in approximately June 2015. Currently, she has no evidence of disease.  Her most recent mammogram on July 29, 2021 was reported as BI-RADS 2.  Repeat in March 2024. History of colon cancer: Patient also states that she received chemotherapy for this, possibly FOLFOX but again this is unknown.   Patient expressed understanding and was in agreement  with this plan. She also understands that She can call clinic at any time with any questions, concerns, or complaints.    Cancer Staging  History of breast cancer Staging form: Breast, AJCC 7th Edition - Clinical stage from 01/07/2016: Stage IA (T1c, N0, M0) - Signed by Lloyd Huger, MD on 01/07/2016 Laterality: Right Estrogen receptor status: Positive Progesterone receptor status: Positive HER2 status: Negative   Lloyd Huger, MD   06/08/2022 12:56 PM

## 2022-06-10 ENCOUNTER — Other Ambulatory Visit (HOSPITAL_COMMUNITY): Payer: Self-pay

## 2022-06-15 ENCOUNTER — Inpatient Hospital Stay: Payer: PPO

## 2022-06-15 ENCOUNTER — Ambulatory Visit: Admit: 2022-06-15 | Payer: PRIVATE HEALTH INSURANCE | Attending: Adult Health | Primary: Adult Health

## 2022-06-15 ENCOUNTER — Ambulatory Visit: Admit: 2022-06-15 | Payer: PRIVATE HEALTH INSURANCE

## 2022-06-15 DIAGNOSIS — C9200 Acute myeloblastic leukemia, not having achieved remission: Secondary | ICD-10-CM

## 2022-06-15 LAB — COMPREHENSIVE METABOLIC PANEL
ALT: 17 U/L (ref 0–44)
AST: 11 U/L — ABNORMAL LOW (ref 15–41)
Albumin: 3.6 g/dL (ref 3.5–5.0)
Alkaline Phosphatase: 66 U/L (ref 38–126)
Anion gap: 9 (ref 5–15)
BUN: 11 mg/dL (ref 8–23)
CO2: 28 mmol/L (ref 22–32)
Calcium: 7.8 mg/dL — ABNORMAL LOW (ref 8.9–10.3)
Chloride: 104 mmol/L (ref 98–111)
Creatinine, Ser: 0.79 mg/dL (ref 0.44–1.00)
GFR, Estimated: >60 mL/min (ref >60)
Glucose, Bld: 90 mg/dL (ref 70–99)
Potassium: 2.9 mmol/L — ABNORMAL LOW (ref 3.5–5.1)
Sodium: 141 mmol/L (ref 135–145)
Total Bilirubin: 0.7 mg/dL (ref 0.3–1.2)
Total Protein: 6.2 g/dL — ABNORMAL LOW (ref 6.5–8.1)

## 2022-06-15 LAB — CBC WITH DIFFERENTIAL/PLATELET
Abs Immature Granulocytes: 0.01 10*3/uL (ref 0.00–0.07)
Basophils Absolute: 0 10*3/uL (ref 0.0–0.1)
Basophils Relative: 0 %
Eosinophils Absolute: 0 10*3/uL (ref 0.0–0.5)
Eosinophils Relative: 0 %
HCT: 25.6 % — ABNORMAL LOW (ref 36.0–46.0)
Hemoglobin: 8 g/dL — ABNORMAL LOW (ref 12.0–15.0)
Immature Granulocytes: 0 %
Lymphocytes Relative: 10 %
Lymphs Abs: 0.5 10*3/uL — ABNORMAL LOW (ref 0.7–4.0)
MCH: 28.5 pg (ref 26.0–34.0)
MCHC: 31.3 g/dL (ref 30.0–36.0)
MCV: 91.1 fL (ref 80.0–100.0)
Monocytes Absolute: 0.8 10*3/uL (ref 0.1–1.0)
Monocytes Relative: 16 %
Neutro Abs: 3.6 10*3/uL (ref 1.7–7.7)
Neutrophils Relative %: 74 %
Platelets: 110 10*3/uL — ABNORMAL LOW (ref 150–400)
RBC: 2.81 MIL/uL — ABNORMAL LOW (ref 3.87–5.11)
RDW: 17.3 % — ABNORMAL HIGH (ref 11.5–15.5)
Smear Review: NORMAL
WBC: 4.9 10*3/uL (ref 4.0–10.5)
nRBC: 0 % (ref 0.0–0.2)

## 2022-06-16 ENCOUNTER — Other Ambulatory Visit (HOSPITAL_COMMUNITY): Payer: Self-pay

## 2022-06-16 ENCOUNTER — Other Ambulatory Visit: Payer: Self-pay | Admitting: Pharmacist

## 2022-06-16 DIAGNOSIS — C9200 Acute myeloblastic leukemia, not having achieved remission: Secondary | ICD-10-CM

## 2022-06-16 MED ORDER — POTASSIUM CHLORIDE CRYS ER 20 MEQ PO TBCR: 20.0000 meq | EXTENDED_RELEASE_TABLET | Freq: Every day | ORAL | 2 refills | Status: DC

## 2022-06-17 ENCOUNTER — Other Ambulatory Visit (HOSPITAL_COMMUNITY): Payer: Self-pay

## 2022-06-18 ENCOUNTER — Inpatient Hospital Stay: Payer: PPO

## 2022-06-18 LAB — SAMPLE TO BLOOD BANK

## 2022-06-18 NOTE — Progress Notes (Signed)
Nutrition Follow-up:  Last seen by RD on 03/2021  Referred back to RD for taste alterations  Patient with AML.  Currently on olutasidenib due to progression.  Met with patient in clinic.  Reports that everything taste like steel/metallic taste.  Reports that she hates new chemo pill that she is taking.  Says that the only thing that taste good is orange juice.  She has been able to eat [employer] chicken nuggets with sweet and sour sauce.  Ate baked beans and squash casserole for lunch.  Able to eat [employer] grilled chicken plate with vegetables.      Medications: reviewed  Labs: reviewed  Anthropometrics:   Weight 121 lb today in clinic  129 lb 03/2021   NUTRITION DIAGNOSIS: Inadequate oral intake related to cancer treatment side effects as evidenced by taste alterations   INTERVENTION:  Reviewed strategies to help with taste change. Handout provided Message sent to MD and team regarding patient's concern of feeling nervous in the afternoons recently.   Contact information provided   MONITORING, EVALUATION, GOAL: weight trends, intake   NEXT VISIT: as needed   B. Zenia Resides, [place], [place] Registered Dietitian [phone]

## 2022-06-22 ENCOUNTER — Inpatient Hospital Stay: Payer: PPO

## 2022-06-22 ENCOUNTER — Encounter: Payer: Self-pay | Admitting: Oncology

## 2022-06-22 ENCOUNTER — Other Ambulatory Visit (HOSPITAL_COMMUNITY): Payer: Self-pay

## 2022-06-22 ENCOUNTER — Inpatient Hospital Stay: Payer: PPO | Admitting: Pharmacist

## 2022-06-22 ENCOUNTER — Inpatient Hospital Stay (HOSPITAL_BASED_OUTPATIENT_CLINIC_OR_DEPARTMENT_OTHER): Payer: PPO | Admitting: Oncology

## 2022-06-22 VITALS — BP 109/62 | HR 83 | Temp 96.4°F | Resp 16 | Ht 64.0 in | Wt 120.0 lb

## 2022-06-22 DIAGNOSIS — C9200 Acute myeloblastic leukemia, not having achieved remission: Secondary | ICD-10-CM

## 2022-06-22 LAB — COMPREHENSIVE METABOLIC PANEL
ALT: 22 U/L (ref 0–44)
AST: 17 U/L (ref 15–41)
Albumin: 3.8 g/dL (ref 3.5–5.0)
Alkaline Phosphatase: 62 U/L (ref 38–126)
Anion gap: 10 (ref 5–15)
BUN: 11 mg/dL (ref 8–23)
CO2: 26 mmol/L (ref 22–32)
Calcium: 8.2 mg/dL — ABNORMAL LOW (ref 8.9–10.3)
Chloride: 102 mmol/L (ref 98–111)
Creatinine, Ser: 1 mg/dL (ref 0.44–1.00)
GFR, Estimated: 60 mL/min — ABNORMAL LOW (ref >60)
Glucose, Bld: 106 mg/dL — ABNORMAL HIGH (ref 70–99)
Potassium: 3.3 mmol/L — ABNORMAL LOW (ref 3.5–5.1)
Sodium: 138 mmol/L (ref 135–145)
Total Bilirubin: 0.8 mg/dL (ref 0.3–1.2)
Total Protein: 6.6 g/dL (ref 6.5–8.1)

## 2022-06-22 LAB — CBC WITH DIFFERENTIAL/PLATELET
Abs Immature Granulocytes: 0.55 10*3/uL — ABNORMAL HIGH (ref 0.00–0.07)
Basophils Absolute: 0.1 10*3/uL (ref 0.0–0.1)
Basophils Relative: 1 %
Eosinophils Absolute: 0 10*3/uL (ref 0.0–0.5)
Eosinophils Relative: 0 %
HCT: 29.7 % — ABNORMAL LOW (ref 36.0–46.0)
Hemoglobin: 9.1 g/dL — ABNORMAL LOW (ref 12.0–15.0)
Immature Granulocytes: 9 %
Lymphocytes Relative: 9 %
Lymphs Abs: 0.6 10*3/uL — ABNORMAL LOW (ref 0.7–4.0)
MCH: 28.8 pg (ref 26.0–34.0)
MCHC: 30.6 g/dL (ref 30.0–36.0)
MCV: 94 fL (ref 80.0–100.0)
Monocytes Absolute: 0.8 10*3/uL (ref 0.1–1.0)
Monocytes Relative: 13 %
Neutro Abs: 4.3 10*3/uL (ref 1.7–7.7)
Neutrophils Relative %: 68 %
Platelets: 48 10*3/uL — ABNORMAL LOW (ref 150–400)
RBC: 3.16 MIL/uL — ABNORMAL LOW (ref 3.87–5.11)
RDW: 17.2 % — ABNORMAL HIGH (ref 11.5–15.5)
Smear Review: NORMAL
WBC: 6.3 10*3/uL (ref 4.0–10.5)
nRBC: 0 % (ref 0.0–0.2)

## 2022-06-22 LAB — SAMPLE TO BLOOD BANK

## 2022-06-22 NOTE — Progress Notes (Signed)
[place]  Telephone:([phone] Fax:([phone]  Patient Care Team: [name], MD as PCP - General (Internal Medicine) Wellington Hampshire, MD as PCP - Cardiology (Cardiology) Lloyd Huger, MD as Consulting Physician (Oncology)   Name of the patient: Debra Hale  ZO:[id number]  [birth year]   Date of visit: 06/22/22  HPI: Patient is a 72 y.o. female with AML. Previously treated with azacitadine and venetoclax. A repeat BM biopsy showed disease progression. 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  Reason for Consult: Oral chemotherapy follow-up for Rezlidhia (olutasidenib) therapy.   PAST MEDICAL HISTORY: Past Medical History:  Diagnosis Date   Abdominal pain 03/06/2016   Acute pericarditis 05/31/2013   Anxiety    Arthritis    Osteoarthritis   Breast cancer ([place]) 2009   right breast lumpectomy with rad tx   Colon cancer ([place])    surgery with chemo and rad tx   Complication of anesthesia    GERD (gastroesophageal reflux disease)    Heart palpitations    History of hiatal hernia    History of kidney stones    History of thyroid cancer 09/18/2008   Qualifier: Diagnosis of   By: Orville Govern CMA, Carol       Hypothyroidism    Liver disease    Liver nodule    s/p negative biopsy   Malignant neoplasm of thyroid gland (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[place])   04/12/2020 Initial Diagnosis   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  MEDICATIONS:  Current Outpatient Medications  Medication Sig Dispense Refill   Azelastine-Fluticasone 137-50 MCG/ACT SUSP SPRAY 2 SPRAYS INTO EACH NOSTRIL EVERY DAY (Patient not taking: Reported on 05/14/2022) 23 g 3   clonazePAM (KLONOPIN) 0.5 MG tablet Take 1 tablet (0.5 mg total) by mouth 3 (three) times daily as needed for anxiety. 30 tablet 0   diphenoxylate-atropine (LOMOTIL) 2.5-0.025 MG tablet Take 1 tablet by mouth 4 (four) times daily as needed for diarrhea or loose stools. (Patient not taking: Reported on 05/14/2022) 60 tablet 1   escitalopram (LEXAPRO) 20 MG tablet Take 20 mg by mouth every morning.      levofloxacin (LEVAQUIN) 500 MG tablet Take 1 tablet (500 mg total) by mouth daily. 30 tablet 0   levothyroxine (SYNTHROID) 125 MCG tablet TAKE 1 TABLET EVERY DAY ON EMPTY STOMACHWITH A GLASS OF WATER AT LEAST 30-60 MINBEFORE BREAKFAST 90 tablet 1   loperamide (  IMODIUM) 2 MG capsule Take by mouth.     magic mouthwash w/lidocaine SOLN Take 10 mLs by mouth 4 (four) times daily as needed for mouth pain. 43m diphenhydramine; 323mMaalox; 2047midocaine (Patient not taking: Reported on 05/14/2022) 80 mL 0   metoprolol tartrate (LOPRESSOR) 25 MG tablet TAKE 1 TABLET (25 MG TOTAL) BY MOUTH AS NEEDED (AS NEEDED UP TO TWICE DAILY FOR PALPITATIONS). (Patient  not taking: Reported on 04/22/2022) 60 tablet 1   Olutasidenib (REZLIDHIA) 150 MG CAPS Take 1 tablet by mouth 2 (two) times daily. 60 capsule 0   ondansetron (ZOFRAN) 4 MG tablet Take 1 tablet (4 mg total) by mouth 2 (two) times daily. Take prior to Rezlidhia (oral chemotherapy) dose. 60 tablet 2   pantoprazole (PROTONIX) 40 MG tablet TAKE ONE TABLET (40 MG) BY MOUTH EVERY DAY 90 tablet 2   potassium chloride SA (KLOR-CON M) 20 MEQ tablet Take 1 tablet (20 mEq total) by mouth daily. 30 tablet 2   prochlorperazine (COMPAZINE) 10 MG tablet Take 1 tablet (10 mg total) by mouth every 6 (six) hours as needed for nausea or vomiting. 30 tablet 3   valACYclovir (VALTREX) 500 MG tablet Take 1 tablet (500 mg total) by mouth daily. 30 tablet 0   No current facility-administered medications for this visit.    VITAL SIGNS: There were no vitals taken for this visit. There were no vitals filed for this visit.  Estimated body mass index is 20.6 kg/m as calculated from the following:   Height as of an earlier encounter on 06/22/22: 5' 4"$  (1.626 m).   Weight as of an earlier encounter on 06/22/22: 54.4 kg (120 lb).  LABS: CBC:    Component Value Date/Time   WBC 6.3 06/22/2022 1049   HGB 9.1 (L) 06/22/2022 1049   HGB 12.5 05/09/2018 1147   HCT 29.7 (L) 06/22/2022 1049   HCT 36.8 05/09/2018 1147   PLT 48 (L) 06/22/2022 1049   PLT 285 05/09/2018 1147   MCV 94.0 06/22/2022 1049   MCV 86 05/09/2018 1147   MCV 89 11/30/2013 1419   NEUTROABS 4.3 06/22/2022 1049   NEUTROABS 2.9 11/20/2013 1404   LYMPHSABS 0.6 (L) 06/22/2022 1049   LYMPHSABS 0.9 (L) 11/20/2013 1404   MONOABS 0.8 06/22/2022 1049   MONOABS 0.2 11/20/2013 1404   EOSABS 0.0 06/22/2022 1049   EOSABS 0.1 11/20/2013 1404   BASOSABS 0.1 06/22/2022 1049   BASOSABS 0.0 11/20/2013 1404   Comprehensive Metabolic Panel:    Component Value Date/Time   NA 138 06/22/2022 1049   NA 143 05/09/2018 1147   NA 145 11/30/2013 1419   K 3.3 (L)  06/22/2022 1049   K 3.5 11/30/2013 1419   CL 102 06/22/2022 1049   CL 109 (H) 11/30/2013 1419   CO2 26 06/22/2022 1049   CO2 29 11/30/2013 1419   BUN 11 06/22/2022 1049   BUN 11 05/09/2018 1147   BUN 15 11/30/2013 1419   CREATININE 1.00 06/22/2022 1049   CREATININE 1.04 11/30/2013 1419   GLUCOSE 106 (H) 06/22/2022 1049   GLUCOSE 97 11/30/2013 1419   CALCIUM 8.2 (L) 06/22/2022 1049   CALCIUM 8.7 11/30/2013 1419   AST 17 06/22/2022 1049   AST 28 11/30/2013 1419   ALT 22 06/22/2022 1049   ALT 71 (H) 11/30/2013 1419   ALKPHOS 62 06/22/2022 1049   ALKPHOS 114 11/30/2013 1419   BILITOT 0.8 06/22/2022 1049   BILITOT 0.7 11/30/2013 1419   PROT 6.6 06/22/2022 1049  PROT 7.1 11/30/2013 1419   ALBUMIN 3.8 06/22/2022 1049   ALBUMIN 3.3 (L) 11/30/2013 1419     Present during today's visit: patient only  Assessment and Plan: CMP/CBC reviewed; platelets down to 48 from 110 on 06/15/2022, which is a Grade 3 toxicity. Will reduce olutasidenib frequency to 128m daily (from 1581mtwice a day). Potassium up to 3.3 from 2.9 on 06/15/2022. Continue taking potassium chloride 20 mEq daily.  Patient will continue with weekly lab checks   Oral Chemotherapy Side Effect/Intolerance:  Nausea: patient is not having as much nausea as the last visit. She reports that taking anti-nausea pill at night is helping with nausea overnight. Diarrhea: patient has diarrhea at baseline. Recommended taking loperamide at the first instance of diarrhea. Itchy skin: patient reported itchy skin on the back around to her stomach. Recommended keeping her skin moisturized, and applying lotion to the itch area when she notices.  Fatigue: patient reports some fatigue at baseline, but she has noticed that she gets out of breath on exertion. Recommended that patient rest when she gets tired.  Taste changes: patient reports that she notices a metallic taste when drinking water. She also reports taste changes when eating. She says  it comes and goes, but that using a mouthwash prescribed by her dentist has helped with the taste of food in the morning. Recommended that she continue to use the mouthwash.  Muscle pain and edema were not reported.  Oral Chemotherapy Adherence: patient reports that she has missed one dose (06/18/2022 AM dose [facility]  There are no patient barriers to medication adherence identified.   New medications: None reported   Medication Access Issues: No issues[facility]  Patient expressed understanding and was in agreement with this plan. She also understands that She can call clinic at any time with any questions, concerns, or complaints.   Follow-up plan: RTC in one week for labs/Pharm.   Thank you for allowing me to participate in the care of this very pleasant patient.   Time Total: 20 minutes  Visit consisted of counseling and education on dealing with issues of symptom management in the setting of serious and potentially life-threatening illness.Greater than 50%  of this time was spent counseling and coordinating care related to the above assessment and plan.  Signed by: AlDarl PikesPharmD, BCPS, BCSalley SlaughterCPP Hematology/Oncology Clinical Pharmacist Practitioner [place]/[phone]/19/2024 1:12 PM

## 2022-06-22 NOTE — Progress Notes (Signed)
Her concerns today consists of taste. She states everything she eats has a bad taste and is affecting her appetite.

## 2022-06-22 NOTE — Progress Notes (Signed)
Debra Hale  [phone] [phone]  ID: [name] OB: October 08, 1950  MR#: [id number]  CSN#:[id number]  Patient Care Team: [name], MD as PCP - General (Internal Medicine) Wellington Hampshire, MD as PCP - Cardiology (Cardiology) Lloyd Huger, MD as Consulting Physician (Oncology)  CHIEF COMPLAINT: AML.  INTERVAL HISTORY: Patient returns to clinic today for repeat laboratory, further evaluation and continuation of Rezlidhia.  She continues to have a poor appetite secondary to taste as well as nausea.  She also has occasional diarrhea.  She does not complain of weakness or fatigue today.  She continues to be highly anxious.  She denies any pain.  She has no neurologic complaints.  She has no chest pain, shortness of breath, cough, or hemoptysis.  She denies any nausea or vomiting.  She has no urinary complaints.  Patient offers no further specific complaints today.  REVIEW OF SYSTEMS:   Review of Systems  Constitutional:  Positive for malaise/fatigue. Negative for fever and weight loss.  HENT:  Negative for congestion and sinus pain.   Respiratory: Negative.  Negative for cough, hemoptysis and shortness of breath.   Cardiovascular: Negative.  Negative for chest pain and leg swelling.  Gastrointestinal:  Positive for diarrhea and nausea. Negative for abdominal pain.  Genitourinary: Negative.  Negative for dysuria.  Musculoskeletal: Negative.  Negative for back pain and falls.  Skin: Negative.  Negative for rash.  Neurological:  Positive for weakness. Negative for dizziness, focal weakness and headaches.  Psychiatric/Behavioral:  Negative for memory loss. The patient is nervous/anxious.     As per HPI. Otherwise, a complete review of systems is negative.  PAST MEDICAL HISTORY: Past Medical History:  Diagnosis Date   Abdominal pain 03/06/2016   Acute pericarditis 05/31/2013   Anxiety    Arthritis    Osteoarthritis   Breast cancer  ([place]) 2009   right breast lumpectomy with rad tx   Colon cancer ([place])    surgery with chemo and rad tx   Complication of anesthesia    GERD (gastroesophageal reflux disease)    Heart palpitations    History of hiatal hernia    History of kidney stones    History of thyroid cancer 09/18/2008   Qualifier: Diagnosis of   By: Orville Govern, CMA, Carol       Hypothyroidism    Liver disease    Liver nodule    s/p negative biopsy   Malignant neoplasm of thyroid gland ([place]) 2002   s/p surgery and XRT   Osteoporosis    Other and unspecified hyperlipidemia    Palpitations    Personal history of chemotherapy    Personal history of malignant neoplasm of large intestine    carcinoma - cecum, s/p right laparoscopic colectomy - s/p chemotherapy and XRT   Personal history of radiation therapy    Pneumonia 2019   PONV (postoperative nausea and vomiting)    Pure hypercholesterolemia    Unspecified hereditary and idiopathic peripheral neuropathy     PAST SURGICAL HISTORY: Past Surgical History:  Procedure Laterality Date   APPENDECTOMY  1985   BREAST BIOPSY Right 2009   positive   BREAST BIOPSY Right 2009   negative   BREAST LUMPECTOMY Right 2009   breast cancer   CHOLECYSTECTOMY  1995   COLONOSCOPY     COLONOSCOPY WITH PROPOFOL N/A 10/29/2017   Procedure: COLONOSCOPY WITH PROPOFOL;  Surgeon: Manya Silvas, MD;  Location: [facility] ENDOSCOPY;  Service: Endoscopy;  Laterality: N/A;  DILATION AND CURETTAGE OF UTERUS  1990   DILATION AND CURETTAGE, DIAGNOSTIC / THERAPEUTIC  1990   ESOPHAGOGASTRODUODENOSCOPY     ESOPHAGOGASTRODUODENOSCOPY (EGD) WITH PROPOFOL N/A 10/29/2017   Procedure: ESOPHAGOGASTRODUODENOSCOPY (EGD) WITH PROPOFOL;  Surgeon: Manya Silvas, MD;  Location: [facility] ENDOSCOPY;  Service: Endoscopy;  Laterality: N/A;   INCISION AND DRAINAGE PERIRECTAL ABSCESS N/A 06/17/2020   Procedure: IRRIGATION AND DEBRIDEMENT PERIRECTAL ABSCESS;  Surgeon: Jules Husbands, MD;  Location: [facility] ORS;   Service: General;  Laterality: N/A;   IR BONE MARROW BIOPSY & ASPIRATION  07/09/2021   LAPAROSCOPIC PARTIAL COLECTOMY     stage 3-C carcinoma of the cecum, s/p chemotherapy and xrt   LITHOTRIPSY     SIGMOIDOSCOPY  08/26/1993   THYROID LOBECTOMY  2002   s/p XRT   TOTAL HIP ARTHROPLASTY Right 10/24/2019   Procedure: TOTAL HIP ARTHROPLASTY;  Surgeon: Corky Mull, MD;  Location: [facility] ORS;  Service: Orthopedics;  Laterality: Right;    FAMILY HISTORY: Family History  Problem Relation Age of Onset   Stroke Mother        49s   Alzheimer's disease Mother    Lung cancer Father    Prostate cancer Father    Cancer Father        Colon   Colon cancer Father    Breast cancer Sister        19's   Lung cancer Sister    Breast cancer Maternal Aunt     ADVANCED DIRECTIVES (Y/N):  N  HEALTH MAINTENANCE: Social History   Tobacco Use   Smoking status: Never   Smokeless tobacco: Never  Vaping Use   Vaping Use: Never used  Substance Use Topics   Alcohol use: No    Alcohol/week: 0.0 standard drinks of alcohol   Drug use: No     Colonoscopy:  PAP:  Bone density:  Lipid panel:  Allergies  Allergen Reactions   Demeclocycline Other (See Comments)    Throat swells   Sulfa Antibiotics Other (See Comments)    Other reaction(s): Other (See Comments) Throat swells Other reaction(s): Other (See Comments) Throat swells Throat swells Other reaction(s): Other (See Comments) Throat swells Other reaction(s): Unknown Other reaction(s): Other (See Comments) Throat swells Other reaction(s): Other (See Comments) Other reaction(s): Other (See Comments) Throat swellsOther reaction(s): Other (See Comments) Throat swells   Tetracyclines & Related Other (See Comments)    Throat swells    Levofloxacin Diarrhea    Severe diarrhea   Augmentin [Amoxicillin-Pot Clavulanate] Diarrhea   Bentyl [Dicyclomine Hcl]     unkn   Ciprofloxacin Diarrhea   Codeine Other (See Comments)    dizziness      Dicyclomine Hcl Other (See Comments)    unkn   Epinephrine     Speeds heart up - panic    Flagyl [Metronidazole] Nausea And Vomiting   Librax [Chlordiazepoxide-Clidinium]     unkn   Novocain [Procaine] Other (See Comments)    "Shaky"   Phenobarbital     unkn   Prednisone     Nervous     Ultram [Tramadol] Other (See Comments)    Sick feeling    Current Outpatient Medications  Medication Sig Dispense Refill   clonazePAM (KLONOPIN) 0.5 MG tablet Take 1 tablet (0.5 mg total) by mouth 3 (three) times daily as needed for anxiety. 30 tablet 0   escitalopram (LEXAPRO) 20 MG tablet Take 20 mg by mouth every morning.      levofloxacin (LEVAQUIN) 500 MG tablet Take  1 tablet (500 mg total) by mouth daily. 30 tablet 0   levothyroxine (SYNTHROID) 125 MCG tablet TAKE 1 TABLET EVERY DAY ON EMPTY STOMACHWITH A GLASS OF WATER AT LEAST 30-60 MINBEFORE BREAKFAST 90 tablet 1   loperamide (IMODIUM) 2 MG capsule Take by mouth.     Olutasidenib (REZLIDHIA) 150 MG CAPS Take 1 tablet by mouth 2 (two) times daily. 60 capsule 0   ondansetron (ZOFRAN) 4 MG tablet Take 1 tablet (4 mg total) by mouth 2 (two) times daily. Take prior to Rezlidhia (oral chemotherapy) dose. 60 tablet 2   pantoprazole (PROTONIX) 40 MG tablet TAKE ONE TABLET (40 MG) BY MOUTH EVERY DAY 90 tablet 2   potassium chloride SA (KLOR-CON M) 20 MEQ tablet Take 1 tablet (20 mEq total) by mouth daily. 30 tablet 2   prochlorperazine (COMPAZINE) 10 MG tablet Take 1 tablet (10 mg total) by mouth every 6 (six) hours as needed for nausea or vomiting. 30 tablet 3   valACYclovir (VALTREX) 500 MG tablet Take 1 tablet (500 mg total) by mouth daily. 30 tablet 0   Azelastine-Fluticasone 137-50 MCG/ACT SUSP SPRAY 2 SPRAYS INTO EACH NOSTRIL EVERY DAY (Patient not taking: Reported on 05/14/2022) 23 g 3   diphenoxylate-atropine (LOMOTIL) 2.5-0.025 MG tablet Take 1 tablet by mouth 4 (four) times daily as needed for diarrhea or loose stools. (Patient not taking:  Reported on 05/14/2022) 60 tablet 1   magic mouthwash w/lidocaine SOLN Take 10 mLs by mouth 4 (four) times daily as needed for mouth pain. 31m diphenhydramine; 371mMaalox; 2065midocaine (Patient not taking: Reported on 05/14/2022) 80 mL 0   metoprolol tartrate (LOPRESSOR) 25 MG tablet TAKE 1 TABLET (25 MG TOTAL) BY MOUTH AS NEEDED (AS NEEDED UP TO TWICE DAILY FOR PALPITATIONS). (Patient not taking: Reported on 04/22/2022) 60 tablet 1   No current facility-administered medications for this visit.    OBJECTIVE: Vitals:   06/22/22 1059  BP: 109/62  Pulse: 83  Resp: 16  Temp: (!) 96.4 F (35.8 C)  SpO2: 100%     Body mass index is 20.6 kg/m.    ECOG FS:1 - Symptomatic but completely ambulatory  General: Well-developed, well-nourished, no acute distress. Eyes: Pink conjunctiva, anicteric sclera. HEENT: Normocephalic, moist mucous membranes. Lungs: No audible wheezing or coughing. Heart: Regular rate and rhythm. Abdomen: Soft, nontender, no obvious distention. Musculoskeletal: No edema, cyanosis, or clubbing. Neuro: Alert, answering all questions appropriately. Cranial nerves grossly intact. Skin: No rashes or petechiae noted. Psych: Normal affect.  LAB RESULTS:  Lab Results  Component Value Date   NA 138 06/22/2022   K 3.3 (L) 06/22/2022   CL 102 06/22/2022   CO2 26 06/22/2022   GLUCOSE 106 (H) 06/22/2022   BUN 11 06/22/2022   CREATININE 1.00 06/22/2022   CALCIUM 8.2 (L) 06/22/2022   PROT 6.6 06/22/2022   ALBUMIN 3.8 06/22/2022   AST 17 06/22/2022   ALT 22 06/22/2022   ALKPHOS 62 06/22/2022   BILITOT 0.8 06/22/2022   GFRNONAA 60 (L) 06/22/2022   GFRAA 54 (L) 10/26/2019    Lab Results  Component Value Date   WBC 6.3 06/22/2022   NEUTROABS 4.3 06/22/2022   HGB 9.1 (L) 06/22/2022   HCT 29.7 (L) 06/22/2022   MCV 94.0 06/22/2022   PLT 48 (L) 06/22/2022     STUDIES: No results found.   ASSESSMENT: AML.  PLAN:    AML: Confirmed by bone marrow biopsy.   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  Patient's most recent bone marrow biopsy on March 23, 2022 revealed an increase of her blast count from 4% previously to up to 15% currently.  She last received treatment with cycle 22 of Vidaza on February 13, 2022.  Given her progressive disease and  IDH-1 mutation, Vidaza has been discontinued and patient will now receive 150 mg twice daily of Olutasidenib (Rezlidhia).  Patient has been instructed to dose reduce her treatment to once a day for the next 7 days secondary to thrombocytopenia.  Follow-up in 1 week for laboratory work and clinical pharmacy evaluation.  Patient will then follow-up in 2 weeks for further evaluation and continuation of treatment.  Neutropenia: Resolved.  Patient has discontinued Levaquin.  Treatment as above. Thrombocytopenia: Platelet count decreased to 46.  Dose reduced treatment to once per day as above. Diarrhea: Intermittent.  Continue Lomotil as needed.   Supportive care: Patient previously was given referrals to home health and home palliative care.  Unclear if these services are still active.  Appreciate palliative care input.   Hypokalemia: Potassium improved to 3.3.  Continue oral potassium supplementation. Anemia: Hemoglobin ending up to 9.1.  Monitor.   Nausea: Patient has been instructed to take scheduled ondansetron twice per day. 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  Patient also received chemotherapy and likely received Adriamycin, but exact regimen is unknown.  She completed 5 years of hormonal therapy in approximately June 2015. Currently, she has no evidence of disease.  Her most recent mammogram on July 29, 2021 was  reported as BI-RADS 2.  Repeat in March 2024. History of colon cancer: Patient also states that she received chemotherapy for this, possibly FOLFOX but again this is unknown.   Patient expressed understanding and was in agreement with this plan. She also understands that She can call clinic at any time with any questions, concerns, or complaints.    Cancer Staging  History of breast cancer Staging form: Breast, AJCC 7th Edition - Clinical stage from 01/07/2016: Stage IA (T1c, N0, M0) - Signed by Lloyd Huger, MD on 01/07/2016 Laterality: Right Estrogen receptor status: Positive Progesterone receptor status: Positive HER2 status: Negative   Lloyd Huger, MD   06/22/2022 12:28 PM

## 2022-06-24 ENCOUNTER — Other Ambulatory Visit: Payer: Self-pay | Admitting: Oncology

## 2022-06-24 ENCOUNTER — Encounter: Payer: Self-pay | Admitting: Internal Medicine

## 2022-06-24 ENCOUNTER — Other Ambulatory Visit (HOSPITAL_COMMUNITY): Payer: Self-pay

## 2022-06-24 ENCOUNTER — Other Ambulatory Visit: Payer: Self-pay

## 2022-06-24 DIAGNOSIS — C9200 Acute myeloblastic leukemia, not having achieved remission: Secondary | ICD-10-CM

## 2022-06-24 MED ORDER — REZLIDHIA 150 MG PO CAPS
1.0000 | ORAL_CAPSULE | Freq: Two times a day (BID) | ORAL | 0 refills | Status: DC
  Filled 2022-06-24: qty 60, 30d supply, fill #0

## 2022-06-24 NOTE — Telephone Encounter (Signed)
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   0 Result Notes           Component Ref Range & Units 2 d ago (06/22/22) 9 d ago (06/15/22) 2 wk ago (06/08/22) 1 mo ago (05/14/22) 1 mo ago (05/04/22) 2 mo ago (04/22/22) 2 mo ago (04/09/22)  WBC 4.0 - 10.5 K/uL 6.3 4.9 0.9 Low Panic  CM 0.6 Low Panic  CM 0.6 Low Panic  CM 0.6 Low Panic  CM 0.3 Low Panic  CM  RBC 3.87 - 5.11 MIL/uL 3.16 Low  2.81 Low  2.87 Low  2.82 Low  2.38 Low  3.27 Low  3.25 Low   Hemoglobin 12.0 - 15.0 g/dL 9.1 Low  8.0 Low  8.1 Low  8.7 Low  8.8 Low  10.4 Low  9.6 Low   HCT 36.0 - 46.0 % 29.7 Low  25.6 Low  25.7 Low  25.7 Low  22.9 Low  29.2 Low  28.2 Low   MCV 80.0 - 100.0 fL 94.0 91.1 89.5 91.1 96.2 89.3 86.8  MCH 26.0 - 34.0 pg 28.8 28.5 28.2 30.9 37.0 High  31.8 29.5  MCHC 30.0 - 36.0 g/dL 30.6 31.3 31.5 33.9 38.4 High  35.6 34.0  RDW 11.5 - 15.5 % 17.2 High  17.3 High  16.8 High  16.0 High  18.6 High  15.2 15.1  Platelets 150 - 400 K/uL 48 Low  110 Low  138 Low  111 Low  123 Low  144 Low  48 Low  CM  Comment: SPECIMEN CHECKED FOR CLOTS  nRBC 0.0 - 0.2 % 0.0 0.0 0.0 0.0 0.0 0.0 0.0  Neutrophils Relative % % 68 74 36 14 35 18 35  Neutro Abs 1.7 - 7.7 K/uL 4.3 3.6 0.3 Low Panic  CM 0.1 Low Panic  CM 0.2 Low Panic  CM 0.1 Low Panic  CM 0.1 Low Panic  CM  Lymphocytes Relative % 9 10 43 81 61 74 56  Lymphs Abs 0.7 - 4.0 K/uL 0.6 Low  0.5 Low  0.4 Low  0.5 Low  0.4 Low  0.5 Low  0.2 Low   Monocytes Relative % 13 16 15 5 4 5 6  $ Monocytes Absolute 0.1 - 1.0 K/uL 0.8 0.8 0.1 0.0 Low  0.0 Low  0.0 Low  0.0 Low   Eosinophils Relative % 0 0 1 0 0 0 3  Eosinophils Absolute 0.0 - 0.5 K/uL 0.0 0.0 0.0 0.0 0.0 0.0 0.0  Basophils Relative % 1 0 0 0 0 0 0  Basophils Absolute 0.0 - 0.1 K/uL 0.1 0.0 0.0 0.0 0.0 0.0 0.0  WBC Morphology  MODERATE LEFT SHIFT  (>5% METAS AND MYELOS,OCC PRO NOTED) UNREMARKABLE TOO FEW TO COUNT TOO FEW TOO COUNT TOO FEW TO COUNT, SMEAR AVAILABLE FOR REVIEW TOO FEW TOO COUNT TOO FEW TO COUNT  Comment: DIFF. CONFIRMED BY SMEAR  RBC Morphology  MORPHOLOGY UNREMARKABLE MIXED RBC POPULATION WITH FEW FRAGMENTED RBCS NOTED.  MORPHOLOGY UNREMARKABLE MIXED RBC POPULATION MORPHOLOGY UNREMARKABLE   Smear Review  Normal platelet morphology Normal platelet morphology CM Normal platelet morphology CM Normal platelet morphology CM Normal platelet morphology Normal platelet morphology CM Normal platelet morphology  Comment: PLATELETS APPEAR DECREASED  Immature Granulocytes % 9 0 5 0 0 3 0  Abs Immature Granulocytes 0.00 - 0.07 K/uL 0.55 High  0.01 0.05 0.00 CM 0.00 CM 0.02 CM 0.00  Comment: Performed at [facility], [address] [place], [place] [address]  Acanthocytes   PRESENT       Trudee Kuster Cells   PRESENT CM       Schistocytes    PRESENT    PRESENT  Tear Drop Cells    PRESENT      Ovalocytes    PRESENT CM    PRESENT CM  Resulting Agency  [facility] [place] [facility] [place] [facility] [place] [facility] [place] [facility] [place] [facility] [place] [facility]         Specimen Collected: 06/22/22 10:49 Last Resulted: 06/22/22 12:19      Lab Flowsheet      Order Details      View Encounter      Lab and Collection Details      Routing      Result History    View All Conversations on this Encounter      CM=Additional comments      Result Care Coordination   Patient Communication   Add Comments   Add Notifications  Back to Top      Other Results from 06/22/2022  Hold Tube- Blood Bank Order: [id number] Status: Final result      Visible to patient: Yes (not seen)      Next appt: 06/30/2022 at 10:45 AM in Oncology ([facility])      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    0 Result Notes      Component 2 d ago  Blood Bank Specimen SAMPLE AVAILABLE FOR TESTING  Sample Expiration 06/25/2022,2359 Performed at [facility], [address] [place], [place] [address]  Resulting Agency [facility]         Specimen Collected: 06/22/22 10:49 Last Resulted: 06/22/22 17:08      Lab Flowsheet       Order Details       View Encounter       Lab and Collection Details       Routing       Result History     View All Conversations on this Encounter        Result Care Coordination   Patient Communication   Add Comments   Add Notifications  Back to Top         Contains abnormal data Comprehensive metabolic panel Order: [id number] Status: Final result      Visible to patient: Yes (not seen)      Next appt: 06/30/2022 at 10:45 AM in Oncology ([facility])      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    0 Result Notes             Component Ref Range & Units 2 d ago (06/22/22) 9 d ago (06/15/22) 2 wk ago (06/08/22) 1 mo ago (05/14/22) 1 mo ago (05/04/22) 2 mo ago (04/22/22) 2 mo ago (04/09/22)  Sodium 135 - 145 mmol/L 138 141 140 139 140 139 140  Potassium 3.5 - 5.1 mmol/L 3.3 Low  2.9 Low  3.1 Low  3.8 3.0 Low  3.6 4.0  Chloride 98 - 111 mmol/L 102 104 105 105 102 109 114 High   CO2 22 - 32 mmol/L 26 28 25 24 26 22 21 $ Low   Glucose, Bld 70 - 99 mg/dL 106 High  90 CM  126 High  CM 119 High  CM 130 High  CM 126 High  CM 102 High  CM  Comment: Glucose reference range applies only to samples taken after fasting for at least 8 hours.  BUN 8 - 23 mg/dL 11 11 11 14 11 17 8  $ Creatinine, Ser 0.44 - 1.00 mg/dL 1.00 0.79 0.88 0.82 0.80 0.94 0.57  Calcium 8.9 - 10.3 mg/dL 8.2 Low  7.8 Low  8.4 Low  8.4 Low  8.3 Low  8.5 Low  8.1 Low   Total Protein 6.5 - 8.1 g/dL 6.6 6.2 Low  6.8 6.8 7.1 7.0   Albumin 3.5 - 5.0 g/dL 3.8 3.6 3.9 3.8 3.7 3.8   AST 15 - 41 U/L 17 11 Low  16 11 Low  14 Low  12 Low    ALT 0 - 44 U/L 22 17 14 13 12 17   $ Alkaline Phosphatase 38 - 126 U/L 62 66 72 68 67 83   Total Bilirubin 0.3 - 1.2 mg/dL 0.8 0.7 0.8 0.6 1.3 High  0.8   GFR, Estimated >60 mL/min 60 Low  >60 CM  >60 CM >60 CM >60 CM >60 CM >60 CM  Comment: (NOTE) Calculated using the CKD-EPI Creatinine Equation (2021)  Anion gap 5 - 15 10 9 $ CM 10 CM 10 CM 12 CM 8 CM 5 CM  Comment: Performed at [facility], [address] [place], [place] [address]  Resulting Agency  [facility] [place] [facility] [place] [facility] [place] [facility] [place] [facility] [place] [facility] [place] [facility]         Specimen Collected: 06/22/22 10:49 Last Resulted: 06/22/22 11:17

## 2022-06-25 ENCOUNTER — Other Ambulatory Visit (HOSPITAL_COMMUNITY): Payer: Self-pay

## 2022-06-29 ENCOUNTER — Other Ambulatory Visit: Payer: Self-pay

## 2022-06-29 ENCOUNTER — Inpatient Hospital Stay: Payer: PPO

## 2022-06-29 DIAGNOSIS — C9200 Acute myeloblastic leukemia, not having achieved remission: Secondary | ICD-10-CM

## 2022-06-30 ENCOUNTER — Inpatient Hospital Stay: Payer: PPO | Admitting: Pharmacist

## 2022-06-30 ENCOUNTER — Ambulatory Visit: Payer: PPO | Admitting: Internal Medicine

## 2022-06-30 ENCOUNTER — Inpatient Hospital Stay: Payer: PPO

## 2022-06-30 DIAGNOSIS — C9200 Acute myeloblastic leukemia, not having achieved remission: Secondary | ICD-10-CM

## 2022-06-30 DIAGNOSIS — C73 Malignant neoplasm of thyroid gland: Secondary | ICD-10-CM

## 2022-06-30 LAB — COMPREHENSIVE METABOLIC PANEL
ALT: 22 U/L (ref 0–44)
AST: 17 U/L (ref 15–41)
Albumin: 3.9 g/dL (ref 3.5–5.0)
Alkaline Phosphatase: 63 U/L (ref 38–126)
Anion gap: 10 (ref 5–15)
BUN: 14 mg/dL (ref 8–23)
CO2: 26 mmol/L (ref 22–32)
Calcium: 8.8 mg/dL — ABNORMAL LOW (ref 8.9–10.3)
Chloride: 105 mmol/L (ref 98–111)
Creatinine, Ser: 0.95 mg/dL (ref 0.44–1.00)
GFR, Estimated: >60 mL/min (ref >60)
Glucose, Bld: 109 mg/dL — ABNORMAL HIGH (ref 70–99)
Potassium: 3.9 mmol/L (ref 3.5–5.1)
Sodium: 141 mmol/L (ref 135–145)
Total Bilirubin: 0.8 mg/dL (ref 0.3–1.2)
Total Protein: 6.7 g/dL (ref 6.5–8.1)

## 2022-06-30 LAB — CBC WITH DIFFERENTIAL/PLATELET
Abs Immature Granulocytes: 0.68 10*3/uL — ABNORMAL HIGH (ref 0.00–0.07)
Basophils Absolute: 0.1 10*3/uL (ref 0.0–0.1)
Basophils Relative: 1 %
Eosinophils Absolute: 0.5 10*3/uL (ref 0.0–0.5)
Eosinophils Relative: 4 %
HCT: 29.3 % — ABNORMAL LOW (ref 36.0–46.0)
Hemoglobin: 9.1 g/dL — ABNORMAL LOW (ref 12.0–15.0)
Immature Granulocytes: 5 %
Lymphocytes Relative: 5 %
Lymphs Abs: 0.6 10*3/uL — ABNORMAL LOW (ref 0.7–4.0)
MCH: 29.3 pg (ref 26.0–34.0)
MCHC: 31.1 g/dL (ref 30.0–36.0)
MCV: 94.2 fL (ref 80.0–100.0)
Monocytes Absolute: 0.5 10*3/uL (ref 0.1–1.0)
Monocytes Relative: 4 %
Neutro Abs: 11 10*3/uL — ABNORMAL HIGH (ref 1.7–7.7)
Neutrophils Relative %: 81 %
Platelets: 71 10*3/uL — ABNORMAL LOW (ref 150–400)
RBC: 3.11 MIL/uL — ABNORMAL LOW (ref 3.87–5.11)
RDW: 16.6 % — ABNORMAL HIGH (ref 11.5–15.5)
Smear Review: NORMAL
WBC: 13.3 10*3/uL — ABNORMAL HIGH (ref 4.0–10.5)
nRBC: 0 % (ref 0.0–0.2)

## 2022-06-30 LAB — SAMPLE TO BLOOD BANK

## 2022-06-30 MED ORDER — HYDROCORTISONE 2.5 % EX LOTN: TOPICAL_LOTION | Freq: Two times a day (BID) | CUTANEOUS | 1 refills | Status: DC

## 2022-06-30 NOTE — Progress Notes (Signed)
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 Previously treated with azacitadine and venetoclax. A repeat BM biopsy showed disease progression. 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  Reason for Consult: Oral chemotherapy follow-up for Rezlidhia (olutasidenib) therapy.   PAST MEDICAL HISTORY: Past Medical History:  Diagnosis Date   Abdominal pain 03/06/2016   Acute pericarditis 05/31/2013   Anxiety    Arthritis    Osteoarthritis   Breast cancer ([place]) 2009   right breast lumpectomy with rad tx   Colon cancer ([place])    surgery with chemo and rad tx   Complication of anesthesia    GERD (gastroesophageal reflux disease)    Heart palpitations    History of hiatal hernia    History of kidney stones    History of thyroid cancer 09/18/2008   Qualifier: Diagnosis of   By: Orville Govern CMA, Carol       Hypothyroidism    Liver disease    Liver nodule    s/p negative biopsy   Malignant neoplasm of thyroid gland ([place]) 2002   s/p surgery and XRT   Osteoporosis    Other and unspecified hyperlipidemia    Palpitations    Personal history of chemotherapy    Personal history of malignant neoplasm of large intestine    carcinoma - cecum, s/p right laparoscopic colectomy - s/p chemotherapy and XRT   Personal history of radiation therapy    Pneumonia 2019   PONV (postoperative nausea and vomiting)    Pure hypercholesterolemia    Unspecified hereditary and idiopathic peripheral neuropathy     HEMATOLOGY/ONCOLOGY HISTORY:  Oncology History  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[place])  04/12/2020 Initial Diagnosis   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  MEDICATIONS:  Current Outpatient Medications  Medication Sig Dispense Refill   Azelastine-Fluticasone 137-50 MCG/ACT SUSP SPRAY 2 SPRAYS INTO EACH NOSTRIL EVERY DAY (Patient not taking: Reported on 05/14/2022) 23 g 3   clonazePAM (KLONOPIN) 0.5 MG tablet Take 1 tablet (0.5 mg total) by mouth 3 (three) times daily as needed for anxiety. 30 tablet 0   diphenoxylate-atropine (LOMOTIL) 2.5-0.025 MG tablet Take 1 tablet by mouth 4 (four) times daily as needed for diarrhea or loose stools. (Patient not taking: Reported on 05/14/2022) 60 tablet 1   escitalopram (LEXAPRO) 20 MG tablet Take 20 mg by mouth every morning.      levothyroxine (SYNTHROID) 125 MCG tablet TAKE 1 TABLET EVERY DAY ON EMPTY STOMACHWITH A GLASS OF WATER AT LEAST 30-60 MINBEFORE BREAKFAST 90 tablet 1   loperamide (IMODIUM) 2 MG capsule Take by mouth.     magic mouthwash w/lidocaine SOLN Take  10 mLs by mouth 4 (four) times daily as needed for mouth pain. 43m diphenhydramine; 3110mMaalox; 2078midocaine (Patient not taking: Reported on 05/14/2022) 80 mL 0   metoprolol tartrate (LOPRESSOR) 25 MG tablet TAKE 1 TABLET (25 MG TOTAL) BY MOUTH AS NEEDED (AS NEEDED UP TO TWICE DAILY FOR PALPITATIONS). (Patient not taking: Reported on 04/22/2022) 60 tablet 1   Olutasidenib  (REZLIDHIA) 150 MG CAPS Take 1 tablet by mouth 2 (two) times daily. 60 capsule 0   ondansetron (ZOFRAN) 4 MG tablet Take 1 tablet (4 mg total) by mouth 2 (two) times daily. Take prior to Rezlidhia (oral chemotherapy) dose. 60 tablet 2   pantoprazole (PROTONIX) 40 MG tablet TAKE ONE TABLET (40 MG) BY MOUTH EVERY DAY 90 tablet 2   potassium chloride SA (KLOR-CON M) 20 MEQ tablet Take 1 tablet (20 mEq total) by mouth daily. 30 tablet 2   prochlorperazine (COMPAZINE) 10 MG tablet Take 1 tablet (10 mg total) by mouth every 6 (six) hours as needed for nausea or vomiting. 30 tablet 3   valACYclovir (VALTREX) 500 MG tablet Take 1 tablet (500 mg total) by mouth daily. 30 tablet 0   No current facility-administered medications for this visit.    VITAL SIGNS: There were no vitals taken for this visit. There were no vitals filed for this visit.  Estimated body mass index is 20.6 kg/m as calculated from the following:   Height as of 06/22/22: '5\' 4"'$  (1.626 m).   Weight as of 06/22/22: 54.4 kg (120 lb).  LABS: CBC:    Component Value Date/Time   WBC 13.3 (H) 06/30/2022 1053   HGB 9.1 (L) 06/30/2022 1053   HGB 12.5 05/09/2018 1147   HCT 29.3 (L) 06/30/2022 1053   HCT 36.8 05/09/2018 1147   PLT 71 (L) 06/30/2022 1053   PLT 285 05/09/2018 1147   MCV 94.2 06/30/2022 1053   MCV 86 05/09/2018 1147   MCV 89 11/30/2013 1419   NEUTROABS 11.0 (H) 06/30/2022 1053   NEUTROABS 2.9 11/20/2013 1404   LYMPHSABS 0.6 (L) 06/30/2022 1053   LYMPHSABS 0.9 (L) 11/20/2013 1404   MONOABS 0.5 06/30/2022 1053   MONOABS 0.2 11/20/2013 1404   EOSABS 0.5 06/30/2022 1053   EOSABS 0.1 11/20/2013 1404   BASOSABS 0.1 06/30/2022 1053   BASOSABS 0.0 11/20/2013 1404   Comprehensive Metabolic Panel:    Component Value Date/Time   NA 141 06/30/2022 1053   NA 143 05/09/2018 1147   NA 145 11/30/2013 1419   K 3.9 06/30/2022 1053   K 3.5 11/30/2013 1419   CL 105 06/30/2022 1053   CL 109 (H) 11/30/2013 1419   CO2 26  06/30/2022 1053   CO2 29 11/30/2013 1419   BUN 14 06/30/2022 1053   BUN 11 05/09/2018 1147   BUN 15 11/30/2013 1419   CREATININE 0.95 06/30/2022 1053   CREATININE 1.04 11/30/2013 1419   GLUCOSE 109 (H) 06/30/2022 1053   GLUCOSE 97 11/30/2013 1419   CALCIUM 8.8 (L) 06/30/2022 1053   CALCIUM 8.7 11/30/2013 1419   AST 17 06/30/2022 1053   AST 28 11/30/2013 1419   ALT 22 06/30/2022 1053   ALT 71 (H) 11/30/2013 1419   ALKPHOS 63 06/30/2022 1053   ALKPHOS 114 11/30/2013 1419   BILITOT 0.8 06/30/2022 1053   BILITOT 0.7 11/30/2013 1419   PROT 6.7 06/30/2022 1053   PROT 7.1 11/30/2013 1419   ALBUMIN 3.9 06/30/2022 1053   ALBUMIN 3.3 (L) 11/30/2013 1419  Present during today's visit: patient only  Assessment and Plan: CMP/CBC reviewed; platelets improved with dose reduction. WBC continues to increase, discussed with Dr. [name] and flow cytometry added on to next weeks labs. Potassium now corrected Continue reduced dose olutasidenib '150mg'$  daily  Patient will continue with weekly lab checks   Oral Chemotherapy Side Effect/Intolerance:  Nausea: improved but still present occasionally Diarrhea: improved but something she continues to use loperamide to manage Itchy skin: patient continues to have itching on her abdomen and stomach. Prescription sent in for hydrocortisone lotion Taste changes and fatigue: improved since dose decrease  Oral Chemotherapy Adherence: none reported There are no patient barriers to medication adherence identified.   New medications: None reported   Medication Access Issues: No issues, patient fills at [facility].   Patient expressed understanding and was in agreement with this plan. She also understands that She can call clinic at any time with any questions, concerns, or complaints.   Follow-up plan: RTC in one week for labs/MD.   Thank you for allowing me to participate in the care of this very pleasant patient.   Time Total: 15  minutes  Visit consisted of counseling and education on dealing with issues of symptom management in the setting of serious and potentially life-threatening illness.Greater than 50%  of this time was spent counseling and coordinating care related to the above assessment and plan.  Signed by: Darl Pikes, PharmD, BCPS, Salley Slaughter, CPP Hematology/Oncology Clinical Pharmacist Practitioner [place]/DB/AP [facility] [phone]  06/30/2022 12:00 PM

## 2022-06-30 NOTE — Addendum Note (Signed)
Addended by: Darl Pikes on: 06/30/2022 02:31 PM   Modules accepted: Orders

## 2022-07-03 ENCOUNTER — Other Ambulatory Visit (HOSPITAL_COMMUNITY): Payer: Self-pay

## 2022-07-06 ENCOUNTER — Inpatient Hospital Stay: Payer: PPO | Attending: Oncology

## 2022-07-06 ENCOUNTER — Other Ambulatory Visit: Payer: Self-pay | Admitting: Internal Medicine

## 2022-07-06 ENCOUNTER — Encounter: Payer: Self-pay | Admitting: Oncology

## 2022-07-06 ENCOUNTER — Inpatient Hospital Stay (HOSPITAL_BASED_OUTPATIENT_CLINIC_OR_DEPARTMENT_OTHER): Payer: PPO | Admitting: Oncology

## 2022-07-06 VITALS — BP 115/69 | HR 91 | Temp 96.9°F | Resp 16 | Wt 117.4 lb

## 2022-07-06 DIAGNOSIS — C9200 Acute myeloblastic leukemia, not having achieved remission: Secondary | ICD-10-CM | POA: Insufficient documentation

## 2022-07-06 DIAGNOSIS — Z79899 Other long term (current) drug therapy: Secondary | ICD-10-CM | POA: Insufficient documentation

## 2022-07-06 DIAGNOSIS — Z85038 Personal history of other malignant neoplasm of large intestine: Secondary | ICD-10-CM | POA: Insufficient documentation

## 2022-07-06 DIAGNOSIS — Z853 Personal history of malignant neoplasm of breast: Secondary | ICD-10-CM | POA: Insufficient documentation

## 2022-07-06 LAB — CBC WITH DIFFERENTIAL/PLATELET
Abs Immature Granulocytes: 0.07 10*3/uL (ref 0.00–0.07)
Basophils Absolute: 0.1 10*3/uL (ref 0.0–0.1)
Basophils Relative: 1 %
Eosinophils Absolute: 0.3 10*3/uL (ref 0.0–0.5)
Eosinophils Relative: 5 %
HCT: 30.6 % — ABNORMAL LOW (ref 36.0–46.0)
Hemoglobin: 9.4 g/dL — ABNORMAL LOW (ref 12.0–15.0)
Immature Granulocytes: 1 %
Lymphocytes Relative: 8 %
Lymphs Abs: 0.5 10*3/uL — ABNORMAL LOW (ref 0.7–4.0)
MCH: 28.8 pg (ref 26.0–34.0)
MCHC: 30.7 g/dL (ref 30.0–36.0)
MCV: 93.9 fL (ref 80.0–100.0)
Monocytes Absolute: 0.3 10*3/uL (ref 0.1–1.0)
Monocytes Relative: 5 %
Neutro Abs: 5.4 10*3/uL (ref 1.7–7.7)
Neutrophils Relative %: 80 %
Platelets: 119 10*3/uL — ABNORMAL LOW (ref 150–400)
RBC: 3.26 MIL/uL — ABNORMAL LOW (ref 3.87–5.11)
RDW: 16.1 % — ABNORMAL HIGH (ref 11.5–15.5)
Smear Review: DECREASED
WBC: 6.7 10*3/uL (ref 4.0–10.5)
nRBC: 0 % (ref 0.0–0.2)

## 2022-07-06 LAB — COMPREHENSIVE METABOLIC PANEL
ALT: 25 U/L (ref 0–44)
AST: 14 U/L — ABNORMAL LOW (ref 15–41)
Albumin: 4 g/dL (ref 3.5–5.0)
Alkaline Phosphatase: 56 U/L (ref 38–126)
Anion gap: 8 (ref 5–15)
BUN: 19 mg/dL (ref 8–23)
CO2: 25 mmol/L (ref 22–32)
Calcium: 8.8 mg/dL — ABNORMAL LOW (ref 8.9–10.3)
Chloride: 107 mmol/L (ref 98–111)
Creatinine, Ser: 0.94 mg/dL (ref 0.44–1.00)
GFR, Estimated: >60 mL/min (ref >60)
Glucose, Bld: 113 mg/dL — ABNORMAL HIGH (ref 70–99)
Potassium: 3.5 mmol/L (ref 3.5–5.1)
Sodium: 140 mmol/L (ref 135–145)
Total Bilirubin: 0.6 mg/dL (ref 0.3–1.2)
Total Protein: 6.7 g/dL (ref 6.5–8.1)

## 2022-07-06 NOTE — Progress Notes (Signed)
[facility]  [phone] Fax:(336) [phone]  ID: AARVI STABLES OB: 27-Mar-1951  MR#: [id number]  CSN#:[id number]  Patient Care Team: [name], MD as PCP - General (Internal Medicine) Wellington Hampshire, MD as PCP - Cardiology (Cardiology) Lloyd Huger, MD as Consulting Physician (Oncology)  CHIEF COMPLAINT: AML.  INTERVAL HISTORY: Patient returns to clinic today for repeat laboratory work, further evaluation, and can continuation of dose reduced Rezlidhia.  Since Deak using her dose to 150 mg once per day, her appetite has improved and she admits to less nausea.  She does not complain of diarrhea today.  She does not complain of weakness or fatigue today.  She continues to be highly anxious.  She denies any pain.  She has no neurologic complaints.  She has no chest pain, shortness of breath, cough, or hemoptysis.  She denies any nausea or vomiting.  She has no urinary complaints.  Patient offers no further specific complaints today.  REVIEW OF SYSTEMS:   Review of Systems  Constitutional: Negative.  Negative for fever, malaise/fatigue and weight loss.  HENT:  Negative for congestion and sinus pain.   Respiratory: Negative.  Negative for cough, hemoptysis and shortness of breath.   Cardiovascular: Negative.  Negative for chest pain and leg swelling.  Gastrointestinal:  Positive for nausea. Negative for abdominal pain and diarrhea.  Genitourinary: Negative.  Negative for dysuria.  Musculoskeletal: Negative.  Negative for back pain and falls.  Skin: Negative.  Negative for rash.  Neurological: Negative.  Negative for dizziness, focal weakness, weakness and headaches.  Psychiatric/Behavioral:  Negative for memory loss. The patient is nervous/anxious.     As per HPI. Otherwise, a complete review of systems is negative.  PAST MEDICAL HISTORY: Past Medical History:  Diagnosis Date   Abdominal pain 03/06/2016   Acute pericarditis 05/31/2013    Anxiety    Arthritis    Osteoarthritis   Breast cancer ([place]) 2009   right breast lumpectomy with rad tx   Colon cancer ([place])    surgery with chemo and rad tx   Complication of anesthesia    GERD (gastroesophageal reflux disease)    Heart palpitations    History of hiatal hernia    History of kidney stones    History of thyroid cancer 09/18/2008   Qualifier: Diagnosis of   By: Orville Govern, CMA, Carol       Hypothyroidism    Liver disease    Liver nodule    s/p negative biopsy   Malignant neoplasm of thyroid gland ([place]) 2002   s/p surgery and XRT   Osteoporosis    Other and unspecified hyperlipidemia    Palpitations    Personal history of chemotherapy    Personal history of malignant neoplasm of large intestine    carcinoma - cecum, s/p right laparoscopic colectomy - s/p chemotherapy and XRT   Personal history of radiation therapy    Pneumonia 2019   PONV (postoperative nausea and vomiting)    Pure hypercholesterolemia    Unspecified hereditary and idiopathic peripheral neuropathy     PAST SURGICAL HISTORY: Past Surgical History:  Procedure Laterality Date   APPENDECTOMY  1985   BREAST BIOPSY Right 2009   positive   BREAST BIOPSY Right 2009   negative   BREAST LUMPECTOMY Right 2009   breast cancer   CHOLECYSTECTOMY  1995   COLONOSCOPY     COLONOSCOPY WITH PROPOFOL N/A 10/29/2017   Procedure: COLONOSCOPY WITH PROPOFOL;  Surgeon: Manya Silvas, MD;  Location: [facility] ENDOSCOPY;  Service: Endoscopy;  Laterality: N/A;   DILATION AND CURETTAGE OF UTERUS  1990   DILATION AND CURETTAGE, DIAGNOSTIC / THERAPEUTIC  1990   ESOPHAGOGASTRODUODENOSCOPY     ESOPHAGOGASTRODUODENOSCOPY (EGD) WITH PROPOFOL N/A 10/29/2017   Procedure: ESOPHAGOGASTRODUODENOSCOPY (EGD) WITH PROPOFOL;  Surgeon: Manya Silvas, MD;  Location: [facility] ENDOSCOPY;  Service: Endoscopy;  Laterality: N/A;   INCISION AND DRAINAGE PERIRECTAL ABSCESS N/A 06/17/2020   Procedure: IRRIGATION AND DEBRIDEMENT PERIRECTAL  ABSCESS;  Surgeon: Jules Husbands, MD;  Location: [facility] ORS;  Service: General;  Laterality: N/A;   IR BONE MARROW BIOPSY & ASPIRATION  07/09/2021   LAPAROSCOPIC PARTIAL COLECTOMY     stage 3-C carcinoma of the cecum, s/p chemotherapy and xrt   LITHOTRIPSY     SIGMOIDOSCOPY  08/26/1993   THYROID LOBECTOMY  2002   s/p XRT   TOTAL HIP ARTHROPLASTY Right 10/24/2019   Procedure: TOTAL HIP ARTHROPLASTY;  Surgeon: Corky Mull, MD;  Location: [facility] ORS;  Service: Orthopedics;  Laterality: Right;    FAMILY HISTORY: Family History  Problem Relation Age of Onset   Stroke Mother        70s   Alzheimer's disease Mother    Lung cancer Father    Prostate cancer Father    Cancer Father        Colon   Colon cancer Father    Breast cancer Sister        63's   Lung cancer Sister    Breast cancer Maternal Aunt     ADVANCED DIRECTIVES (Y/N):  N  HEALTH MAINTENANCE: Social History   Tobacco Use   Smoking status: Never   Smokeless tobacco: Never  Vaping Use   Vaping Use: Never used  Substance Use Topics   Alcohol use: No    Alcohol/week: 0.0 standard drinks of alcohol   Drug use: No     Colonoscopy:  PAP:  Bone density:  Lipid panel:  Allergies  Allergen Reactions   Demeclocycline Other (See Comments)    Throat swells   Sulfa Antibiotics Other (See Comments)    Other reaction(s): Other (See Comments) Throat swells Other reaction(s): Other (See Comments) Throat swells Throat swells Other reaction(s): Other (See Comments) Throat swells Other reaction(s): Unknown Other reaction(s): Other (See Comments) Throat swells Other reaction(s): Other (See Comments) Other reaction(s): Other (See Comments) Throat swellsOther reaction(s): Other (See Comments) Throat swells   Tetracyclines & Related Other (See Comments)    Throat swells    Levofloxacin Diarrhea    Severe diarrhea   Augmentin [Amoxicillin-Pot Clavulanate] Diarrhea   Bentyl [Dicyclomine Hcl]     unkn   Ciprofloxacin  Diarrhea   Codeine Other (See Comments)    dizziness     Dicyclomine Hcl Other (See Comments)    unkn   Epinephrine     Speeds heart up - panic    Flagyl [Metronidazole] Nausea And Vomiting   Librax [Chlordiazepoxide-Clidinium]     unkn   Novocain [Procaine] Other (See Comments)    "Shaky"   Phenobarbital     unkn   Prednisone     Nervous     Ultram [Tramadol] Other (See Comments)    Sick feeling    Current Outpatient Medications  Medication Sig Dispense Refill   chlorhexidine (PERIDEX) 0.12 % solution SMARTSIG:By Mouth     clonazePAM (KLONOPIN) 0.5 MG tablet Take 1 tablet (0.5 mg total) by mouth 3 (three) times daily as needed for anxiety. 30 tablet 0   escitalopram (  LEXAPRO) 20 MG tablet Take 20 mg by mouth every morning.      levothyroxine (SYNTHROID) 125 MCG tablet TAKE 1 TABLET EVERY DAY ON EMPTY STOMACHWITH A GLASS OF WATER AT LEAST 30-60 MINBEFORE BREAKFAST 90 tablet 1   loperamide (IMODIUM) 2 MG capsule Take by mouth.     olutasidenib (REZLIDHIA) 150 MG capsule Take 150 mg by mouth daily. Take on an empty stomach at least 1 hour before or 2 hours after a meal.     ondansetron (ZOFRAN) 4 MG tablet Take 1 tablet (4 mg total) by mouth 2 (two) times daily. Take prior to Rezlidhia (oral chemotherapy) dose. 60 tablet 2   pantoprazole (PROTONIX) 40 MG tablet TAKE ONE TABLET (40 MG) BY MOUTH EVERY DAY 90 tablet 2   potassium chloride SA (KLOR-CON M) 20 MEQ tablet Take 1 tablet (20 mEq total) by mouth daily. 30 tablet 2   prochlorperazine (COMPAZINE) 10 MG tablet Take 1 tablet (10 mg total) by mouth every 6 (six) hours as needed for nausea or vomiting. 30 tablet 3   valACYclovir (VALTREX) 500 MG tablet Take 1 tablet (500 mg total) by mouth daily. 30 tablet 0   Azelastine-Fluticasone 137-50 MCG/ACT SUSP SPRAY 2 SPRAYS INTO EACH NOSTRIL EVERY DAY (Patient not taking: Reported on 05/14/2022) 23 g 3   diphenoxylate-atropine (LOMOTIL) 2.5-0.025 MG tablet Take 1 tablet by mouth 4 (four)  times daily as needed for diarrhea or loose stools. (Patient not taking: Reported on 05/14/2022) 60 tablet 1   hydrocortisone 2.5 % lotion Apply topically 2 (two) times daily. Apply areas of back and stomach that itch. (Patient not taking: Reported on 07/06/2022) 59 mL 1   magic mouthwash w/lidocaine SOLN Take 10 mLs by mouth 4 (four) times daily as needed for mouth pain. 3m diphenhydramine; 363mMaalox; 2071midocaine (Patient not taking: Reported on 05/14/2022) 80 mL 0   metoprolol tartrate (LOPRESSOR) 25 MG tablet TAKE 1 TABLET (25 MG TOTAL) BY MOUTH AS NEEDED (AS NEEDED UP TO TWICE DAILY FOR PALPITATIONS). (Patient not taking: Reported on 04/22/2022) 60 tablet 1   No current facility-administered medications for this visit.    OBJECTIVE: Vitals:   07/06/22 0948  BP: 115/69  Pulse: 91  Resp: 16  Temp: (!) 96.9 F (36.1 C)  SpO2: 100%     Body mass index is 20.15 kg/m.    ECOG FS:1 - Symptomatic but completely ambulatory  General: Well-developed, well-nourished, no acute distress. Eyes: Pink conjunctiva, anicteric sclera. HEENT: Normocephalic, moist mucous membranes. Lungs: No audible wheezing or coughing. Heart: Regular rate and rhythm. Abdomen: Soft, nontender, no obvious distention. Musculoskeletal: No edema, cyanosis, or clubbing. Neuro: Alert, answering all questions appropriately. Cranial nerves grossly intact. Skin: No rashes or petechiae noted. Psych: Normal affect.  LAB RESULTS:  Lab Results  Component Value Date   NA 140 07/06/2022   K 3.5 07/06/2022   CL 107 07/06/2022   CO2 25 07/06/2022   GLUCOSE 113 (H) 07/06/2022   BUN 19 07/06/2022   CREATININE 0.94 07/06/2022   CALCIUM 8.8 (L) 07/06/2022   PROT 6.7 07/06/2022   ALBUMIN 4.0 07/06/2022   AST 14 (L) 07/06/2022   ALT 25 07/06/2022   ALKPHOS 56 07/06/2022   BILITOT 0.6 07/06/2022   GFRNONAA >60 07/06/2022   GFRAA 54 (L) 10/26/2019    Lab Results  Component Value Date   WBC 6.7 07/06/2022   NEUTROABS  5.4 07/06/2022   HGB 9.4 (L) 07/06/2022   HCT 30.6 (L) 07/06/2022  MCV 93.9 07/06/2022   PLT 119 (L) 07/06/2022     STUDIES: No results found.   ASSESSMENT: AML.  PLAN:    AML: Confirmed by bone marrow biopsy.  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  Patient's most recent bone marrow biopsy on March 23, 2022 revealed an increase of her blast count from 4% previously to up to 15% currently.  She last received treatment with cycle 22 of Vidaza on February 13, 2022.  Given her progressive disease and  IDH-1 mutation, Vidaza was discontinued and patient initially seeing 150 mg twice daily of Olutasidenib (Rezlidhia).  This has been now dose reduce to once per day secondary to nausea and thrombocytopenia.  Return to clinic in 4 weeks with repeat laboratory work and further evaluation.   Neutropenia: Resolved.  Patient has discontinued Levaquin.  Treatment as above. Thrombocytopenia: Improved.  Continue dose reduced treatment once per day as above. Diarrhea: Improved.  Continue Lomotil as needed.   Supportive care: Patient previously was given referrals to home health and home palliative care.  Unclear if these services are still active.  Appreciate palliative care input.   Hypokalemia: Resolved.  Continue oral potassium supplementation. Anemia: Hemoglobin is trended up slightly to 9.4.  Monitor. Nausea: Improved with dose reduction.  Continue ondansetron and Compazine as needed.   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  Patient also received chemotherapy and likely received Adriamycin, but exact regimen is unknown.  She completed 5 years of hormonal therapy in approximately June 2015. Currently,  she has no evidence of disease.  Her most recent mammogram on July 29, 2021 was reported as BI-RADS 2.  Repeat in March 2024. History of colon cancer: Patient also states that she received chemotherapy for this, possibly FOLFOX but again this is unknown.   Patient expressed understanding and was in agreement with this plan. She also understands that She can call clinic at any time with any questions, concerns, or complaints.    Cancer Staging  History of breast cancer Staging form: Breast, AJCC 7th Edition - Clinical stage from 01/07/2016: Stage IA (T1c, N0, M0) - Signed by Lloyd Huger, MD on 01/07/2016 Laterality: Right Estrogen receptor status: Positive Progesterone receptor status: Positive HER2 status: Negative   Lloyd Huger, MD   07/06/2022 10:50 AM

## 2022-07-06 NOTE — Progress Notes (Signed)
Pt has lost 3 pounds since last visit. She is still concerned about her taste buds. We discussed Ensure and stated as long as it is not chocolate.

## 2022-07-07 ENCOUNTER — Telehealth: Payer: Self-pay

## 2022-07-07 ENCOUNTER — Other Ambulatory Visit (HOSPITAL_COMMUNITY): Payer: Self-pay

## 2022-07-07 NOTE — Telephone Encounter (Signed)
Oral Oncology Patient Advocate Encounter  Was successful in securing patient a $10,000 grant from [employer] to provide copayment coverage for Rezlidhia.  This will keep the out of pocket expense at $0.     [employer] ID: [id number]   The billing information is as follows and has been shared with Adeline.    RxBin: [id number] PCN: [id number] Member ID: [id number] Group ID: [id number] Dates of Eligibility: 07/27/22 through 07/26/23  Fund:  Acute Myeloid [place], [facility] Patient [place]  [phone] (phone) [phone] (fax) 07/07/2022 10:10 AM

## 2022-07-08 LAB — COMP PANEL: LEUKEMIA/LYMPHOMA: Immunophenotypic Profile: 0

## 2022-07-10 ENCOUNTER — Ambulatory Visit: Payer: PPO | Admitting: Internal Medicine

## 2022-07-13 ENCOUNTER — Inpatient Hospital Stay: Payer: PPO

## 2022-07-13 DIAGNOSIS — C9200 Acute myeloblastic leukemia, not having achieved remission: Secondary | ICD-10-CM | POA: Diagnosis not present

## 2022-07-13 LAB — CBC WITH DIFFERENTIAL/PLATELET
Abs Immature Granulocytes: 0.04 10*3/uL (ref 0.00–0.07)
Basophils Absolute: 0.1 10*3/uL (ref 0.0–0.1)
Basophils Relative: 1 %
Eosinophils Absolute: 0.2 10*3/uL (ref 0.0–0.5)
Eosinophils Relative: 4 %
HCT: 29.1 % — ABNORMAL LOW (ref 36.0–46.0)
Hemoglobin: 9.2 g/dL — ABNORMAL LOW (ref 12.0–15.0)
Immature Granulocytes: 1 %
Lymphocytes Relative: 8 %
Lymphs Abs: 0.5 10*3/uL — ABNORMAL LOW (ref 0.7–4.0)
MCH: 29 pg (ref 26.0–34.0)
MCHC: 31.6 g/dL (ref 30.0–36.0)
MCV: 91.8 fL (ref 80.0–100.0)
Monocytes Absolute: 0.2 10*3/uL (ref 0.1–1.0)
Monocytes Relative: 4 %
Neutro Abs: 4.9 10*3/uL (ref 1.7–7.7)
Neutrophils Relative %: 82 %
Platelets: 167 10*3/uL (ref 150–400)
RBC: 3.17 MIL/uL — ABNORMAL LOW (ref 3.87–5.11)
RDW: 15.9 % — ABNORMAL HIGH (ref 11.5–15.5)
Smear Review: NORMAL
WBC: 5.9 10*3/uL (ref 4.0–10.5)
nRBC: 0 % (ref 0.0–0.2)

## 2022-07-13 LAB — COMPREHENSIVE METABOLIC PANEL
ALT: 28 U/L (ref 0–44)
AST: 12 U/L — ABNORMAL LOW (ref 15–41)
Albumin: 4 g/dL (ref 3.5–5.0)
Alkaline Phosphatase: 67 U/L (ref 38–126)
Anion gap: 7 (ref 5–15)
BUN: 15 mg/dL (ref 8–23)
CO2: 26 mmol/L (ref 22–32)
Calcium: 8.7 mg/dL — ABNORMAL LOW (ref 8.9–10.3)
Chloride: 106 mmol/L (ref 98–111)
Creatinine, Ser: 0.99 mg/dL (ref 0.44–1.00)
GFR, Estimated: >60 mL/min (ref >60)
Glucose, Bld: 148 mg/dL — ABNORMAL HIGH (ref 70–99)
Potassium: 3.6 mmol/L (ref 3.5–5.1)
Sodium: 139 mmol/L (ref 135–145)
Total Bilirubin: 0.7 mg/dL (ref 0.3–1.2)
Total Protein: 6.7 g/dL (ref 6.5–8.1)

## 2022-07-14 ENCOUNTER — Other Ambulatory Visit (HOSPITAL_COMMUNITY): Payer: Self-pay

## 2022-07-20 ENCOUNTER — Inpatient Hospital Stay: Payer: PPO

## 2022-07-20 DIAGNOSIS — C9200 Acute myeloblastic leukemia, not having achieved remission: Secondary | ICD-10-CM | POA: Diagnosis not present

## 2022-07-20 LAB — COMPREHENSIVE METABOLIC PANEL
ALT: 38 U/L (ref 0–44)
AST: 16 U/L (ref 15–41)
Albumin: 3.8 g/dL (ref 3.5–5.0)
Alkaline Phosphatase: 77 U/L (ref 38–126)
Anion gap: 7 (ref 5–15)
BUN: 13 mg/dL (ref 8–23)
CO2: 25 mmol/L (ref 22–32)
Calcium: 8.6 mg/dL — ABNORMAL LOW (ref 8.9–10.3)
Chloride: 106 mmol/L (ref 98–111)
Creatinine, Ser: 0.88 mg/dL (ref 0.44–1.00)
GFR, Estimated: >60 mL/min (ref >60)
Glucose, Bld: 95 mg/dL (ref 70–99)
Potassium: 3.7 mmol/L (ref 3.5–5.1)
Sodium: 138 mmol/L (ref 135–145)
Total Bilirubin: 0.8 mg/dL (ref 0.3–1.2)
Total Protein: 6.4 g/dL — ABNORMAL LOW (ref 6.5–8.1)

## 2022-07-20 LAB — CBC WITH DIFFERENTIAL/PLATELET
Abs Immature Granulocytes: 0.03 10*3/uL (ref 0.00–0.07)
Basophils Absolute: 0.1 10*3/uL (ref 0.0–0.1)
Basophils Relative: 1 %
Eosinophils Absolute: 0.2 10*3/uL (ref 0.0–0.5)
Eosinophils Relative: 4 %
HCT: 28.3 % — ABNORMAL LOW (ref 36.0–46.0)
Hemoglobin: 8.9 g/dL — ABNORMAL LOW (ref 12.0–15.0)
Immature Granulocytes: 1 %
Lymphocytes Relative: 9 %
Lymphs Abs: 0.5 10*3/uL — ABNORMAL LOW (ref 0.7–4.0)
MCH: 28.8 pg (ref 26.0–34.0)
MCHC: 31.4 g/dL (ref 30.0–36.0)
MCV: 91.6 fL (ref 80.0–100.0)
Monocytes Absolute: 0.3 10*3/uL (ref 0.1–1.0)
Monocytes Relative: 5 %
Neutro Abs: 4.2 10*3/uL (ref 1.7–7.7)
Neutrophils Relative %: 80 %
Platelets: 204 10*3/uL (ref 150–400)
RBC: 3.09 MIL/uL — ABNORMAL LOW (ref 3.87–5.11)
RDW: 15.9 % — ABNORMAL HIGH (ref 11.5–15.5)
Smear Review: NORMAL
WBC: 5.3 10*3/uL (ref 4.0–10.5)
nRBC: 0 % (ref 0.0–0.2)

## 2022-07-22 ENCOUNTER — Other Ambulatory Visit (HOSPITAL_COMMUNITY): Payer: Self-pay

## 2022-07-27 ENCOUNTER — Inpatient Hospital Stay: Payer: PPO

## 2022-07-27 DIAGNOSIS — C9200 Acute myeloblastic leukemia, not having achieved remission: Secondary | ICD-10-CM | POA: Diagnosis not present

## 2022-07-27 LAB — CBC WITH DIFFERENTIAL/PLATELET
Abs Immature Granulocytes: 0.02 10*3/uL (ref 0.00–0.07)
Basophils Absolute: 0 10*3/uL (ref 0.0–0.1)
Basophils Relative: 1 %
Eosinophils Absolute: 0.2 10*3/uL (ref 0.0–0.5)
Eosinophils Relative: 5 %
HCT: 27.1 % — ABNORMAL LOW (ref 36.0–46.0)
Hemoglobin: 8.6 g/dL — ABNORMAL LOW (ref 12.0–15.0)
Immature Granulocytes: 1 %
Lymphocytes Relative: 13 %
Lymphs Abs: 0.6 10*3/uL — ABNORMAL LOW (ref 0.7–4.0)
MCH: 29.1 pg (ref 26.0–34.0)
MCHC: 31.7 g/dL (ref 30.0–36.0)
MCV: 91.6 fL (ref 80.0–100.0)
Monocytes Absolute: 0.3 10*3/uL (ref 0.1–1.0)
Monocytes Relative: 6 %
Neutro Abs: 3.3 10*3/uL (ref 1.7–7.7)
Neutrophils Relative %: 74 %
Platelets: 215 10*3/uL (ref 150–400)
RBC: 2.96 MIL/uL — ABNORMAL LOW (ref 3.87–5.11)
RDW: 15.6 % — ABNORMAL HIGH (ref 11.5–15.5)
Smear Review: NORMAL
WBC: 4.4 10*3/uL (ref 4.0–10.5)
nRBC: 0 % (ref 0.0–0.2)

## 2022-07-27 LAB — COMPREHENSIVE METABOLIC PANEL
ALT: 32 U/L (ref 0–44)
AST: 14 U/L — ABNORMAL LOW (ref 15–41)
Albumin: 3.7 g/dL (ref 3.5–5.0)
Alkaline Phosphatase: 73 U/L (ref 38–126)
Anion gap: 4 — ABNORMAL LOW (ref 5–15)
BUN: 12 mg/dL (ref 8–23)
CO2: 26 mmol/L (ref 22–32)
Calcium: 8.3 mg/dL — ABNORMAL LOW (ref 8.9–10.3)
Chloride: 108 mmol/L (ref 98–111)
Creatinine, Ser: 0.91 mg/dL (ref 0.44–1.00)
GFR, Estimated: >60 mL/min (ref >60)
Glucose, Bld: 100 mg/dL — ABNORMAL HIGH (ref 70–99)
Potassium: 3.9 mmol/L (ref 3.5–5.1)
Sodium: 138 mmol/L (ref 135–145)
Total Bilirubin: 0.7 mg/dL (ref 0.3–1.2)
Total Protein: 6.3 g/dL — ABNORMAL LOW (ref 6.5–8.1)

## 2022-08-03 ENCOUNTER — Other Ambulatory Visit (HOSPITAL_COMMUNITY): Payer: Self-pay

## 2022-08-03 ENCOUNTER — Encounter: Payer: Self-pay | Admitting: Oncology

## 2022-08-03 ENCOUNTER — Inpatient Hospital Stay: Payer: PPO | Attending: Oncology

## 2022-08-03 ENCOUNTER — Inpatient Hospital Stay: Payer: PPO | Admitting: Pharmacist

## 2022-08-03 ENCOUNTER — Inpatient Hospital Stay (HOSPITAL_BASED_OUTPATIENT_CLINIC_OR_DEPARTMENT_OTHER): Payer: PPO | Admitting: Oncology

## 2022-08-03 ENCOUNTER — Other Ambulatory Visit: Payer: Self-pay

## 2022-08-03 VITALS — BP 107/59 | HR 75 | Temp 97.3°F | Resp 16 | Ht 64.0 in | Wt 117.8 lb

## 2022-08-03 DIAGNOSIS — Z853 Personal history of malignant neoplasm of breast: Secondary | ICD-10-CM | POA: Insufficient documentation

## 2022-08-03 DIAGNOSIS — Z79899 Other long term (current) drug therapy: Secondary | ICD-10-CM | POA: Diagnosis not present

## 2022-08-03 DIAGNOSIS — Z85038 Personal history of other malignant neoplasm of large intestine: Secondary | ICD-10-CM | POA: Insufficient documentation

## 2022-08-03 DIAGNOSIS — C9200 Acute myeloblastic leukemia, not having achieved remission: Secondary | ICD-10-CM | POA: Diagnosis not present

## 2022-08-03 DIAGNOSIS — R110 Nausea: Secondary | ICD-10-CM | POA: Diagnosis not present

## 2022-08-03 DIAGNOSIS — Z79624 Long term (current) use of inhibitors of nucleotide synthesis: Secondary | ICD-10-CM | POA: Insufficient documentation

## 2022-08-03 DIAGNOSIS — Z923 Personal history of irradiation: Secondary | ICD-10-CM | POA: Insufficient documentation

## 2022-08-03 DIAGNOSIS — Z9221 Personal history of antineoplastic chemotherapy: Secondary | ICD-10-CM | POA: Diagnosis not present

## 2022-08-03 DIAGNOSIS — Z85850 Personal history of malignant neoplasm of thyroid: Secondary | ICD-10-CM | POA: Insufficient documentation

## 2022-08-03 LAB — COMPREHENSIVE METABOLIC PANEL
ALT: 42 U/L (ref 0–44)
AST: 20 U/L (ref 15–41)
Albumin: 4 g/dL (ref 3.5–5.0)
Alkaline Phosphatase: 77 U/L (ref 38–126)
Anion gap: 7 (ref 5–15)
BUN: 16 mg/dL (ref 8–23)
CO2: 25 mmol/L (ref 22–32)
Calcium: 8.9 mg/dL (ref 8.9–10.3)
Chloride: 107 mmol/L (ref 98–111)
Creatinine, Ser: 0.86 mg/dL (ref 0.44–1.00)
GFR, Estimated: >60 mL/min (ref >60)
Glucose, Bld: 124 mg/dL — ABNORMAL HIGH (ref 70–99)
Potassium: 4.2 mmol/L (ref 3.5–5.1)
Sodium: 139 mmol/L (ref 135–145)
Total Bilirubin: 0.7 mg/dL (ref 0.3–1.2)
Total Protein: 6.5 g/dL (ref 6.5–8.1)

## 2022-08-03 LAB — CBC WITH DIFFERENTIAL/PLATELET
Abs Immature Granulocytes: 0.02 10*3/uL (ref 0.00–0.07)
Basophils Absolute: 0 10*3/uL (ref 0.0–0.1)
Basophils Relative: 1 %
Eosinophils Absolute: 0.2 10*3/uL (ref 0.0–0.5)
Eosinophils Relative: 5 %
HCT: 30.9 % — ABNORMAL LOW (ref 36.0–46.0)
Hemoglobin: 9.6 g/dL — ABNORMAL LOW (ref 12.0–15.0)
Immature Granulocytes: 1 %
Lymphocytes Relative: 9 %
Lymphs Abs: 0.4 10*3/uL — ABNORMAL LOW (ref 0.7–4.0)
MCH: 28.7 pg (ref 26.0–34.0)
MCHC: 31.1 g/dL (ref 30.0–36.0)
MCV: 92.5 fL (ref 80.0–100.0)
Monocytes Absolute: 0.2 10*3/uL (ref 0.1–1.0)
Monocytes Relative: 5 %
Neutro Abs: 3.5 10*3/uL (ref 1.7–7.7)
Neutrophils Relative %: 79 %
Platelets: 188 10*3/uL (ref 150–400)
RBC: 3.34 MIL/uL — ABNORMAL LOW (ref 3.87–5.11)
RDW: 15.6 % — ABNORMAL HIGH (ref 11.5–15.5)
Smear Review: NORMAL
WBC: 4.3 10*3/uL (ref 4.0–10.5)
nRBC: 0 % (ref 0.0–0.2)

## 2022-08-03 MED ORDER — OLUTASIDENIB 150 MG PO CAPS
150.0000 mg | ORAL_CAPSULE | Freq: Every day | ORAL | 2 refills | Status: DC
  Filled 2022-08-03 – 2022-09-17 (×2): qty 30, 30d supply, fill #0
  Filled 2022-10-21: qty 30, 30d supply, fill #1
  Filled 2022-11-20: qty 30, 30d supply, fill #2

## 2022-08-03 NOTE — Progress Notes (Signed)
Wants to know if Olutasidenib causes hair loss.

## 2022-08-03 NOTE — Progress Notes (Signed)
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 Previously treated with azacitadine and venetoclax. A repeat BM biopsy showed disease progression. 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 She was dose reduced to olutasidenib 100mg  daily on 06/22/22 due to decreased pltc.   Reason for Consult: Oral chemotherapy follow-up for Rezlidhia (olutasidenib) therapy.   PAST MEDICAL HISTORY: Past Medical History:  Diagnosis Date   Abdominal pain 03/06/2016   Acute pericarditis 05/31/2013   Anxiety    Arthritis    Osteoarthritis   Breast cancer 2009   right breast lumpectomy with rad tx   Colon cancer    surgery with chemo and rad tx   Complication of anesthesia    GERD (gastroesophageal reflux disease)    Heart palpitations    History of hiatal hernia    History of kidney stones    History of thyroid cancer 09/18/2008   Qualifier: Diagnosis of   By: Orville Govern CMA, Carol       Hypothyroidism    Liver disease    Liver nodule    s/p negative biopsy   Malignant neoplasm of thyroid gland 2002   s/p surgery and XRT   Osteoporosis    Other and unspecified hyperlipidemia    Palpitations    Personal history of chemotherapy    Personal history of malignant neoplasm of large intestine    carcinoma - cecum, s/p right laparoscopic colectomy - s/p chemotherapy and XRT   Personal history of radiation therapy    Pneumonia 2019   PONV (postoperative nausea and vomiting)    Pure hypercholesterolemia    Unspecified hereditary and idiopathic peripheral neuropathy      HEMATOLOGY/ONCOLOGY HISTORY:  Oncology History  AML (acute myelogenous leukemia)  04/12/2020 Initial Diagnosis   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[place])   06/10/2020 - 12/19/2021 Chemotherapy   Patient is on Treatment Plan : AML dose reduced azacitidine SQ D1-5 q28d     06/10/2020 - 02/13/2022 Chemotherapy   Patient is on Treatment Plan : AML Azacitidine SQ D1-5 q28d       ALLERGIES:  is allergic to demeclocycline, sulfa antibiotics, tetracyclines & related, levofloxacin, augmentin [amoxicillin-pot clavulanate], bentyl [dicyclomine hcl], ciprofloxacin, codeine, dicyclomine hcl, epinephrine, flagyl [metronidazole], librax [chlordiazepoxide-clidinium], novocain [procaine], phenobarbital, prednisone, and ultram [tramadol].  MEDICATIONS:  Current Outpatient Medications  Medication Sig Dispense Refill   Azelastine-Fluticasone 137-50 MCG/ACT SUSP SPRAY 2 SPRAYS INTO EACH NOSTRIL EVERY DAY (Patient not taking: Reported on 08/03/2022) 23 g 2   chlorhexidine (PERIDEX) 0.12 % solution SMARTSIG:By Mouth (Patient not taking: Reported on 08/03/2022)     clonazePAM (KLONOPIN) 0.5 MG tablet Take 1 tablet (0.5 mg total) by mouth 3 (three) times daily as needed for anxiety. 30 tablet 0   diphenoxylate-atropine (LOMOTIL) 2.5-0.025 MG tablet Take 1 tablet by mouth 4 (four) times daily as needed for diarrhea or loose stools. (Patient not taking: Reported on 05/14/2022) 60 tablet 1   escitalopram (LEXAPRO) 20 MG tablet Take 20 mg by mouth every morning.      hydrocortisone 2.5 % lotion Apply topically 2 (two) times daily. Apply areas of back and stomach  that itch. (Patient not taking: Reported on 07/06/2022) 59 mL 1   levothyroxine (SYNTHROID) 125 MCG tablet TAKE 1 TABLET EVERY DAY ON EMPTY STOMACHWITH A GLASS OF WATER AT LEAST 30-60 MINBEFORE BREAKFAST 90 tablet 1   loperamide (IMODIUM) 2 MG capsule Take by mouth. (Patient not taking: Reported on 08/03/2022)     magic mouthwash w/lidocaine SOLN Take 10 mLs by mouth 4  (four) times daily as needed for mouth pain. 28ml diphenhydramine; 50ml Maalox; 68ml Lidocaine (Patient not taking: Reported on 05/14/2022) 80 mL 0   metoprolol tartrate (LOPRESSOR) 25 MG tablet TAKE 1 TABLET (25 MG TOTAL) BY MOUTH AS NEEDED (AS NEEDED UP TO TWICE DAILY FOR PALPITATIONS). 60 tablet 1   olutasidenib (REZLIDHIA) 150 MG capsule Take 150 mg by mouth daily. Take on an empty stomach at least 1 hour before or 2 hours after a meal.     ondansetron (ZOFRAN) 4 MG tablet Take 1 tablet (4 mg total) by mouth 2 (two) times daily. Take prior to Rezlidhia (oral chemotherapy) dose. 60 tablet 2   pantoprazole (PROTONIX) 40 MG tablet TAKE ONE TABLET (40 MG) BY MOUTH EVERY DAY 90 tablet 2   potassium chloride SA (KLOR-CON M) 20 MEQ tablet Take 1 tablet (20 mEq total) by mouth daily. 30 tablet 2   prochlorperazine (COMPAZINE) 10 MG tablet Take 1 tablet (10 mg total) by mouth every 6 (six) hours as needed for nausea or vomiting. 30 tablet 3   valACYclovir (VALTREX) 500 MG tablet Take 1 tablet (500 mg total) by mouth daily. 30 tablet 0   No current facility-administered medications for this visit.    VITAL SIGNS: There were no vitals taken for this visit. There were no vitals filed for this visit.  Estimated body mass index is 20.22 kg/m as calculated from the following:   Height as of an earlier encounter on 08/03/22: 5\' 4"  (1.626 m).   Weight as of an earlier encounter on 08/03/22: 53.4 kg (117 lb 12.8 oz).  LABS: CBC:    Component Value Date/Time   WBC 4.3 08/03/2022 1002   HGB 9.6 (L) 08/03/2022 1002   HGB 12.5 05/09/2018 1147   HCT 30.9 (L) 08/03/2022 1002   HCT 36.8 05/09/2018 1147   PLT 188 08/03/2022 1002   PLT 285 05/09/2018 1147   MCV 92.5 08/03/2022 1002   MCV 86 05/09/2018 1147   MCV 89 11/30/2013 1419   NEUTROABS 3.5 08/03/2022 1002   NEUTROABS 2.9 11/20/2013 1404   LYMPHSABS 0.4 (L) 08/03/2022 1002   LYMPHSABS 0.9 (L) 11/20/2013 1404   MONOABS 0.2 08/03/2022 1002   MONOABS  0.2 11/20/2013 1404   EOSABS 0.2 08/03/2022 1002   EOSABS 0.1 11/20/2013 1404   BASOSABS 0.0 08/03/2022 1002   BASOSABS 0.0 11/20/2013 1404   Comprehensive Metabolic Panel:    Component Value Date/Time   NA 139 08/03/2022 1002   NA 143 05/09/2018 1147   NA 145 11/30/2013 1419   K 4.2 08/03/2022 1002   K 3.5 11/30/2013 1419   CL 107 08/03/2022 1002   CL 109 (H) 11/30/2013 1419   CO2 25 08/03/2022 1002   CO2 29 11/30/2013 1419   BUN 16 08/03/2022 1002   BUN 11 05/09/2018 1147   BUN 15 11/30/2013 1419   CREATININE 0.86 08/03/2022 1002   CREATININE 1.04 11/30/2013 1419   GLUCOSE 124 (H) 08/03/2022 1002   GLUCOSE 97 11/30/2013 1419   CALCIUM 8.9 08/03/2022 1002   CALCIUM 8.7 11/30/2013 1419   AST  20 08/03/2022 1002   AST 28 11/30/2013 1419   ALT 42 08/03/2022 1002   ALT 71 (H) 11/30/2013 1419   ALKPHOS 77 08/03/2022 1002   ALKPHOS 114 11/30/2013 1419   BILITOT 0.7 08/03/2022 1002   BILITOT 0.7 11/30/2013 1419   PROT 6.5 08/03/2022 1002   PROT 7.1 11/30/2013 1419   ALBUMIN 4.0 08/03/2022 1002   ALBUMIN 3.3 (L) 11/30/2013 1419     Present during today's visit: patient only  Assessment and Plan: CMP/CBC reviewed, continue reduced dose olutasidenib 150mg  daily  Patient reporting hair loss, this is not a side effect currently reported with olutasidenib. Suggested patient start a hair, skin, nails multivitamin to see if her hair loss is nutrition related.    Oral Chemotherapy Side Effect/Intolerance:  Nausea: improved, reports taking anti-emetic daily Diarrhea: present but improved, back to her baseline amount of diarrhea Taste changes and fatigue: present but improved since dose decrease  Oral Chemotherapy Adherence: no missed doses reported There are no patient barriers to medication adherence identified.   New medications: None reported   Medication Access Issues: No issues, patient fills at [facility].   Patient expressed understanding and was in  agreement with this plan. She also understands that She can call clinic at any time with any questions, concerns, or complaints.   Follow-up plan: RTC in one week for labs/MD.   Thank you for allowing me to participate in the care of this very pleasant patient.   Time Total: 15 minutes  Visit consisted of counseling and education on dealing with issues of symptom management in the setting of serious and potentially life-threatening illness.Greater than 50%  of this time was spent counseling and coordinating care related to the above assessment and plan.  Signed by: Darl Pikes, PharmD, BCPS, Salley Slaughter, CPP Hematology/Oncology Clinical Pharmacist Practitioner [place]/DB/AP [facility] [phone]  08/03/2022 12:37 PM

## 2022-08-03 NOTE — Progress Notes (Signed)
[place]  [phone] [phone]) [phone]  ID: [name] OB: 11/04/1950  MR#: [id number]  [id number]  Patient Care Team: [name], MD as PCP - General (Internal Medicine) Wellington Hampshire, MD as PCP - Cardiology (Cardiology) Lloyd Huger, MD as Consulting Physician (Oncology)  CHIEF COMPLAINT: AML.  INTERVAL HISTORY: Patient returns to clinic today for repeat laboratory work, further evaluation, and continuation of dose reduced Rezlidhia.  Since reducing her dose to 150 mg once per day, her appetite has improved and she admits to less nausea.  She does not complain of diarrhea today.  She does not complain of weakness or fatigue today.  She has noticed some hair thinning.  She denies any pain.  She has no neurologic complaints.  She has no chest pain, shortness of breath, cough, or hemoptysis.  She denies any nausea or vomiting.  She has no urinary complaints.  Patient offers no further specific complaints today.  REVIEW OF SYSTEMS:   Review of Systems  Constitutional: Negative.  Negative for fever, malaise/fatigue and weight loss.  HENT:  Negative for congestion and sinus pain.   Respiratory: Negative.  Negative for cough, hemoptysis and shortness of breath.   Cardiovascular: Negative.  Negative for chest pain and leg swelling.  Gastrointestinal: Negative.  Negative for abdominal pain, diarrhea and nausea.  Genitourinary: Negative.  Negative for dysuria.  Musculoskeletal: Negative.  Negative for back pain and falls.  Skin: Negative.  Negative for rash.  Neurological: Negative.  Negative for dizziness, focal weakness, weakness and headaches.  Psychiatric/Behavioral:  Negative for memory loss. The patient is nervous/anxious.     As per HPI. Otherwise, a complete review of systems is negative.  PAST MEDICAL HISTORY: Past Medical History:  Diagnosis Date   Abdominal pain 03/06/2016   Acute pericarditis 05/31/2013   Anxiety     Arthritis    Osteoarthritis   Breast cancer 2009   right breast lumpectomy with rad tx   Colon cancer    surgery with chemo and rad tx   Complication of anesthesia    GERD (gastroesophageal reflux disease)    Heart palpitations    History of hiatal hernia    History of kidney stones    History of thyroid cancer 09/18/2008   Qualifier: Diagnosis of   By: Orville Govern CMA, Carol       Hypothyroidism    Liver disease    Liver nodule    s/p negative biopsy   Malignant neoplasm of thyroid gland 2002   s/p surgery and XRT   Osteoporosis    Other and unspecified hyperlipidemia    Palpitations    Personal history of chemotherapy    Personal history of malignant neoplasm of large intestine    carcinoma - cecum, s/p right laparoscopic colectomy - s/p chemotherapy and XRT   Personal history of radiation therapy    Pneumonia 2019   PONV (postoperative nausea and vomiting)    Pure hypercholesterolemia    Unspecified hereditary and idiopathic peripheral neuropathy     PAST SURGICAL HISTORY: Past Surgical History:  Procedure Laterality Date   APPENDECTOMY  1985   BREAST BIOPSY Right 2009   positive   BREAST BIOPSY Right 2009   negative   BREAST LUMPECTOMY Right 2009   breast cancer   CHOLECYSTECTOMY  1995   COLONOSCOPY     COLONOSCOPY WITH PROPOFOL N/A 10/29/2017   Procedure: COLONOSCOPY WITH PROPOFOL;  Surgeon: Manya Silvas, MD;  Location: [facility] ENDOSCOPY;  Service: Endoscopy;  Laterality: N/A;   DILATION AND CURETTAGE OF UTERUS  1990   DILATION AND CURETTAGE, DIAGNOSTIC / THERAPEUTIC  1990   ESOPHAGOGASTRODUODENOSCOPY     ESOPHAGOGASTRODUODENOSCOPY (EGD) WITH PROPOFOL N/A 10/29/2017   Procedure: ESOPHAGOGASTRODUODENOSCOPY (EGD) WITH PROPOFOL;  Surgeon: Manya Silvas, MD;  Location: [facility] ENDOSCOPY;  Service: Endoscopy;  Laterality: N/A;   INCISION AND DRAINAGE PERIRECTAL ABSCESS N/A 06/17/2020   Procedure: IRRIGATION AND DEBRIDEMENT PERIRECTAL ABSCESS;  Surgeon: Jules Husbands,  MD;  Location: [facility] ORS;  Service: General;  Laterality: N/A;   IR BONE MARROW BIOPSY & ASPIRATION  07/09/2021   LAPAROSCOPIC PARTIAL COLECTOMY     stage 3-C carcinoma of the cecum, s/p chemotherapy and xrt   LITHOTRIPSY     SIGMOIDOSCOPY  08/26/1993   THYROID LOBECTOMY  2002   s/p XRT   TOTAL HIP ARTHROPLASTY Right 10/24/2019   Procedure: TOTAL HIP ARTHROPLASTY;  Surgeon: Corky Mull, MD;  Location: [facility] ORS;  Service: Orthopedics;  Laterality: Right;    FAMILY HISTORY: Family History  Problem Relation Age of Onset   Stroke Mother        54s   Alzheimer's disease Mother    Lung cancer Father    Prostate cancer Father    Cancer Father        Colon   Colon cancer Father    Breast cancer Sister        60's   Lung cancer Sister    Breast cancer Maternal Aunt     ADVANCED DIRECTIVES (Y/N):  N  HEALTH MAINTENANCE: Social History   Tobacco Use   Smoking status: Never   Smokeless tobacco: Never  Vaping Use   Vaping Use: Never used  Substance Use Topics   Alcohol use: No    Alcohol/week: 0.0 standard drinks of alcohol   Drug use: No     Colonoscopy:  PAP:  Bone density:  Lipid panel:  Allergies  Allergen Reactions   Demeclocycline Other (See Comments)    Throat swells   Sulfa Antibiotics Other (See Comments)    Other reaction(s): Other (See Comments) Throat swells Other reaction(s): Other (See Comments) Throat swells Throat swells Other reaction(s): Other (See Comments) Throat swells Other reaction(s): Unknown Other reaction(s): Other (See Comments) Throat swells Other reaction(s): Other (See Comments) Other reaction(s): Other (See Comments) Throat swellsOther reaction(s): Other (See Comments) Throat swells   Tetracyclines & Related Other (See Comments)    Throat swells    Levofloxacin Diarrhea    Severe diarrhea   Augmentin [Amoxicillin-Pot Clavulanate] Diarrhea   Bentyl [Dicyclomine Hcl]     unkn   Ciprofloxacin Diarrhea   Codeine Other (See  Comments)    dizziness     Dicyclomine Hcl Other (See Comments)    unkn   Epinephrine     Speeds heart up - panic    Flagyl [Metronidazole] Nausea And Vomiting   Librax [Chlordiazepoxide-Clidinium]     unkn   Novocain [Procaine] Other (See Comments)    "Shaky"   Phenobarbital     unkn   Prednisone     Nervous     Ultram [Tramadol] Other (See Comments)    Sick feeling    Current Outpatient Medications  Medication Sig Dispense Refill   clonazePAM (KLONOPIN) 0.5 MG tablet Take 1 tablet (0.5 mg total) by mouth 3 (three) times daily as needed for anxiety. 30 tablet 0   escitalopram (LEXAPRO) 20 MG tablet Take 20 mg by mouth every morning.      levothyroxine (SYNTHROID)  125 MCG tablet TAKE 1 TABLET EVERY DAY ON EMPTY STOMACHWITH A GLASS OF WATER AT LEAST 30-60 MINBEFORE BREAKFAST 90 tablet 1   metoprolol tartrate (LOPRESSOR) 25 MG tablet TAKE 1 TABLET (25 MG TOTAL) BY MOUTH AS NEEDED (AS NEEDED UP TO TWICE DAILY FOR PALPITATIONS). 60 tablet 1   olutasidenib (REZLIDHIA) 150 MG capsule Take 150 mg by mouth daily. Take on an empty stomach at least 1 hour before or 2 hours after a meal.     ondansetron (ZOFRAN) 4 MG tablet Take 1 tablet (4 mg total) by mouth 2 (two) times daily. Take prior to Rezlidhia (oral chemotherapy) dose. 60 tablet 2   pantoprazole (PROTONIX) 40 MG tablet TAKE ONE TABLET (40 MG) BY MOUTH EVERY DAY 90 tablet 2   potassium chloride SA (KLOR-CON M) 20 MEQ tablet Take 1 tablet (20 mEq total) by mouth daily. 30 tablet 2   prochlorperazine (COMPAZINE) 10 MG tablet Take 1 tablet (10 mg total) by mouth every 6 (six) hours as needed for nausea or vomiting. 30 tablet 3   valACYclovir (VALTREX) 500 MG tablet Take 1 tablet (500 mg total) by mouth daily. 30 tablet 0   Azelastine-Fluticasone 137-50 MCG/ACT SUSP SPRAY 2 SPRAYS INTO EACH NOSTRIL EVERY DAY (Patient not taking: Reported on 08/03/2022) 23 g 2   chlorhexidine (PERIDEX) 0.12 % solution SMARTSIG:By Mouth (Patient not taking:  Reported on 08/03/2022)     diphenoxylate-atropine (LOMOTIL) 2.5-0.025 MG tablet Take 1 tablet by mouth 4 (four) times daily as needed for diarrhea or loose stools. (Patient not taking: Reported on 05/14/2022) 60 tablet 1   hydrocortisone 2.5 % lotion Apply topically 2 (two) times daily. Apply areas of back and stomach that itch. (Patient not taking: Reported on 07/06/2022) 59 mL 1   loperamide (IMODIUM) 2 MG capsule Take by mouth. (Patient not taking: Reported on 08/03/2022)     magic mouthwash w/lidocaine SOLN Take 10 mLs by mouth 4 (four) times daily as needed for mouth pain. 64ml diphenhydramine; 4ml Maalox; 88ml Lidocaine (Patient not taking: Reported on 05/14/2022) 80 mL 0   No current facility-administered medications for this visit.    OBJECTIVE: Vitals:   08/03/22 1025  BP: (!) 107/59  Pulse: 75  Resp: 16  Temp: (!) 97.3 F (36.3 C)  SpO2: 100%     Body mass index is 20.22 kg/m.    ECOG FS:1 - Symptomatic but completely ambulatory  General: Well-developed, well-nourished, no acute distress. Eyes: Pink conjunctiva, anicteric sclera. HEENT: Normocephalic, moist mucous membranes. Lungs: No audible wheezing or coughing. Heart: Regular rate and rhythm. Abdomen: Soft, nontender, no obvious distention. Musculoskeletal: No edema, cyanosis, or clubbing. Neuro: Alert, answering all questions appropriately. Cranial nerves grossly intact. Skin: No rashes or petechiae noted. Psych: Normal affect.  LAB RESULTS:  Lab Results  Component Value Date   NA 139 08/03/2022   K 4.2 08/03/2022   CL 107 08/03/2022   CO2 25 08/03/2022   GLUCOSE 124 (H) 08/03/2022   BUN 16 08/03/2022   CREATININE 0.86 08/03/2022   CALCIUM 8.9 08/03/2022   PROT 6.5 08/03/2022   ALBUMIN 4.0 08/03/2022   AST 20 08/03/2022   ALT 42 08/03/2022   ALKPHOS 77 08/03/2022   BILITOT 0.7 08/03/2022   GFRNONAA >60 08/03/2022   GFRAA 54 (L) 10/26/2019    Lab Results  Component Value Date   WBC 4.3 08/03/2022    NEUTROABS 3.5 08/03/2022   HGB 9.6 (L) 08/03/2022   HCT 30.9 (L) 08/03/2022   MCV 92.5  08/03/2022   PLT 188 08/03/2022     STUDIES: No results found.   ASSESSMENT: AML.  PLAN:    AML: Confirmed by bone marrow biopsy.  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  Patient's most recent bone marrow biopsy on March 23, 2022 revealed an increase of her blast count from 4% previously to up to 15% currently.  She last received treatment with cycle 22 of Vidaza on February 13, 2022.  Given her progressive disease and  IDH-1 mutation, Vidaza was discontinued and patient initially received 150 mg twice daily of Olutasidenib (Rezlidhia).  This has been now dose reduce to once per day secondary to nausea and thrombocytopenia.  Return to clinic in 2 weeks for laboratory work only, in 4 weeks for laboratory work and evaluation by clinical pharmacy, and then in 8 weeks for further evaluation and continuation of treatment.   Neutropenia: Resolved.  Patient has discontinued Levaquin.  Treatment as above. Thrombocytopenia: Improved.  Continue dose reduced treatment once per day as above. Diarrhea: Patient does not complain of this today.  Continue Lomotil as needed.   Hypokalemia: Resolved.  Continue oral potassium supplementation. Anemia: Chronic and unchanged.  Patient's hemoglobin is 9.6. Nausea: Improved with dose reduction.  Continue ondansetron and Compazine as needed.   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  Patient also received chemotherapy and likely received Adriamycin, but exact regimen is unknown.  She completed 5 years of hormonal therapy in approximately June 2015. Currently, she has no evidence of  disease.  Her most recent mammogram on July 29, 2021 was reported as BI-RADS 2.  Repeat in March 2024. History of colon cancer: Patient also states that she received chemotherapy for this, possibly FOLFOX but again this is unknown. Hair thinning: Unknown likely related to treatment.  I have recommended multivitamin.   Patient expressed understanding and was in agreement with this plan. She also understands that She can call clinic at any time with any questions, concerns, or complaints.    Cancer Staging  History of breast cancer Staging form: Breast, AJCC 7th Edition - Clinical stage from 01/07/2016: Stage IA (T1c, N0, M0) - Signed by Lloyd Huger, MD on 01/07/2016 Laterality: Right Estrogen receptor status: Positive Progesterone receptor status: Positive HER2 status: Negative   Lloyd Huger, MD   08/03/2022 12:19 PM

## 2022-08-04 ENCOUNTER — Other Ambulatory Visit (HOSPITAL_COMMUNITY): Payer: Self-pay

## 2022-08-07 ENCOUNTER — Other Ambulatory Visit (HOSPITAL_COMMUNITY): Payer: Self-pay

## 2022-08-10 ENCOUNTER — Encounter: Payer: Self-pay | Admitting: Internal Medicine

## 2022-08-10 ENCOUNTER — Other Ambulatory Visit (HOSPITAL_COMMUNITY): Payer: Self-pay

## 2022-08-10 ENCOUNTER — Ambulatory Visit (INDEPENDENT_AMBULATORY_CARE_PROVIDER_SITE_OTHER): Payer: PPO | Admitting: Internal Medicine

## 2022-08-10 VITALS — BP 102/70 | HR 86 | Temp 98.0°F | Resp 16 | Ht 64.0 in | Wt 115.6 lb

## 2022-08-10 DIAGNOSIS — F419 Anxiety disorder, unspecified: Secondary | ICD-10-CM

## 2022-08-10 DIAGNOSIS — Z1231 Encounter for screening mammogram for malignant neoplasm of breast: Secondary | ICD-10-CM | POA: Diagnosis not present

## 2022-08-10 DIAGNOSIS — F320 Major depressive disorder, single episode, mild: Secondary | ICD-10-CM

## 2022-08-10 DIAGNOSIS — K219 Gastro-esophageal reflux disease without esophagitis: Secondary | ICD-10-CM | POA: Diagnosis not present

## 2022-08-10 DIAGNOSIS — E039 Hypothyroidism, unspecified: Secondary | ICD-10-CM

## 2022-08-10 DIAGNOSIS — Z85038 Personal history of other malignant neoplasm of large intestine: Secondary | ICD-10-CM | POA: Diagnosis not present

## 2022-08-10 DIAGNOSIS — G620 Drug-induced polyneuropathy: Secondary | ICD-10-CM | POA: Diagnosis not present

## 2022-08-10 DIAGNOSIS — T451X5A Adverse effect of antineoplastic and immunosuppressive drugs, initial encounter: Secondary | ICD-10-CM

## 2022-08-10 DIAGNOSIS — E7800 Pure hypercholesterolemia, unspecified: Secondary | ICD-10-CM | POA: Diagnosis not present

## 2022-08-10 DIAGNOSIS — I5032 Chronic diastolic (congestive) heart failure: Secondary | ICD-10-CM

## 2022-08-10 DIAGNOSIS — D61818 Other pancytopenia: Secondary | ICD-10-CM | POA: Diagnosis not present

## 2022-08-10 DIAGNOSIS — Z853 Personal history of malignant neoplasm of breast: Secondary | ICD-10-CM | POA: Diagnosis not present

## 2022-08-10 DIAGNOSIS — C9200 Acute myeloblastic leukemia, not having achieved remission: Secondary | ICD-10-CM | POA: Diagnosis not present

## 2022-08-10 NOTE — Progress Notes (Signed)
Subjective:    Patient ID: Debra Hale, female    DOB: 1950/09/01, 72 y.o.   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  Dose reduced due to nausea and thrombocytopenia.  Both improved. Still with intermittent nausea, but has zofran to take.  Still some diarrhea, but overall better.  No chest pain reported.  Breathing stable.  Does report hair thinning.  Some increased stress.  Discussed.     Past Medical History:  Diagnosis Date   Abdominal pain 03/06/2016   Acute pericarditis 05/31/2013   Anxiety    Arthritis    Osteoarthritis   Breast cancer 2009   right breast lumpectomy with rad tx   Colon cancer    surgery with chemo and rad tx   Complication of anesthesia    GERD (gastroesophageal reflux disease)    Heart palpitations    History of hiatal hernia    History of kidney stones    History of thyroid cancer 09/18/2008   Qualifier: Diagnosis of   By: Denyse Amass CMA, Carol       Hypothyroidism    Liver disease    Liver nodule    s/p negative biopsy   Malignant neoplasm of thyroid gland 2002   s/p surgery and XRT   Osteoporosis    Other and unspecified hyperlipidemia    Palpitations    Personal history of chemotherapy    Personal history of malignant neoplasm of large intestine    carcinoma - cecum, s/p right laparoscopic colectomy - s/p chemotherapy and XRT   Personal history of radiation therapy    Pneumonia 2019   PONV (postoperative nausea and vomiting)    Pure hypercholesterolemia    Unspecified hereditary and idiopathic peripheral neuropathy    Past Surgical History:  Procedure Laterality Date   APPENDECTOMY  1985   BREAST BIOPSY Right 2009   positive   BREAST BIOPSY Right 2009   negative   BREAST LUMPECTOMY Right 2009   breast cancer   CHOLECYSTECTOMY  1995   COLONOSCOPY     COLONOSCOPY WITH PROPOFOL N/A 10/29/2017    Procedure: COLONOSCOPY WITH PROPOFOL;  Surgeon: Scot Jun, MD;  Location: [facility] ENDOSCOPY;  Service: Endoscopy;  Laterality: N/A;   DILATION AND CURETTAGE OF UTERUS  1990   DILATION AND CURETTAGE, DIAGNOSTIC / THERAPEUTIC  1990   ESOPHAGOGASTRODUODENOSCOPY     ESOPHAGOGASTRODUODENOSCOPY (EGD) WITH PROPOFOL N/A 10/29/2017   Procedure: ESOPHAGOGASTRODUODENOSCOPY (EGD) WITH PROPOFOL;  Surgeon: Scot Jun, MD;  Location: [facility] ENDOSCOPY;  Service: Endoscopy;  Laterality: N/A;   INCISION AND DRAINAGE PERIRECTAL ABSCESS N/A 06/17/2020   Procedure: IRRIGATION AND DEBRIDEMENT PERIRECTAL ABSCESS;  Surgeon: Leafy Ro, MD;  Location: [facility] ORS;  Service: General;  Laterality: N/A;   IR BONE MARROW BIOPSY & ASPIRATION  07/09/2021   LAPAROSCOPIC PARTIAL COLECTOMY     stage 3-C carcinoma of the cecum, s/p chemotherapy and xrt   LITHOTRIPSY     SIGMOIDOSCOPY  08/26/1993   THYROID LOBECTOMY  2002   s/p XRT   TOTAL HIP ARTHROPLASTY Right 10/24/2019   Procedure: TOTAL HIP ARTHROPLASTY;  Surgeon: Christena Flake, MD;  Location: [facility] ORS;  Service: Orthopedics;  Laterality: Right;   Family History  Problem Relation Age of Onset   Stroke Mother        25s   Alzheimer's disease  Mother    Lung cancer Father    Prostate cancer Father    Cancer Father        Colon   Colon cancer Father    Breast cancer Sister        79's   Lung cancer Sister    Breast cancer Maternal Aunt    Social History   Socioeconomic History   Marital status: Single    Spouse name: Not on file   Number of children: 0   Years of education: Not on file   Highest education level: Not on file  Occupational History   Not on file  Tobacco Use   Smoking status: Never   Smokeless tobacco: Never  Vaping Use   Vaping Use: Never used  Substance and Sexual Activity   Alcohol use: No    Alcohol/week: 0.0 standard drinks of alcohol   Drug use: No   Sexual activity: Not Currently  Other Topics Concern   Not on file   Social History Narrative   Not on file   Social Determinants of Health   Financial Resource Strain: Low Risk  (09/11/2021)   Overall Financial Resource Strain (CARDIA)    Difficulty of Paying Living Expenses: Not very hard  Recent Concern: Financial Resource Strain - Medium Risk (07/21/2021)   Overall Financial Resource Strain (CARDIA)    Difficulty of Paying Living Expenses: Somewhat hard  Food Insecurity: No Food Insecurity (04/10/2022)   Hunger Vital Sign    Worried About Running Out of Food in the Last Year: Never true    Ran Out of Food in the Last Year: Never true  Transportation Needs: No Transportation Needs (04/10/2022)   PRAPARE - Administrator, Civil Service (Medical): No    Lack of Transportation (Non-Medical): No  Physical Activity: Insufficiently Active (09/11/2021)   Exercise Vital Sign    Days of Exercise per Week: 3 days    Minutes of Exercise per Session: 10 min  Stress: No Stress Concern Present (09/11/2021)   [employer] of Occupational Health - Occupational Stress Questionnaire    Feeling of Stress : Not at all  Social Connections: Socially Isolated (09/11/2021)   Social Connection and Isolation Panel [NHANES]    Frequency of Communication with Friends and Family: Once a week    Frequency of Social Gatherings with Friends and Family: Once a week    Attends Religious Services: Never    Database administrator or Organizations: No    Attends Banker Meetings: Never    Marital Status: Divorced     Review of Systems  Constitutional:  Positive for fatigue. Negative for appetite change and unexpected weight change.  HENT:  Negative for congestion and sinus pressure.   Respiratory:  Negative for cough, chest tightness and shortness of breath.   Cardiovascular:  Negative for chest pain and palpitations.  Gastrointestinal:  Negative for abdominal pain, diarrhea, nausea and vomiting.  Genitourinary:  Negative for difficulty urinating and  dysuria.  Musculoskeletal:  Negative for joint swelling and myalgias.  Skin:  Negative for color change and rash.  Neurological:  Negative for dizziness and headaches.  Psychiatric/Behavioral:  Negative for agitation and dysphoric mood.        Objective:     BP 102/70   Pulse 86   Temp 98 F (36.7 C)   Resp 16   Ht 5\' 4"  (1.626 m)   Wt 115 lb 9.6 oz (52.4 kg)   SpO2 99%  BMI 19.84 kg/m  Wt Readings from Last 3 Encounters:  08/10/22 115 lb 9.6 oz (52.4 kg)  08/03/22 117 lb 12.8 oz (53.4 kg)  07/06/22 117 lb 6.4 oz (53.3 kg)    Physical Exam Vitals reviewed.  Constitutional:      General: She is not in acute distress.    Appearance: Normal appearance.  HENT:     Head: Normocephalic and atraumatic.     Right Ear: External ear normal.     Left Ear: External ear normal.  Eyes:     General: No scleral icterus.       Right eye: No discharge.        Left eye: No discharge.     Conjunctiva/sclera: Conjunctivae normal.  Neck:     Thyroid: No thyromegaly.  Cardiovascular:     Rate and Rhythm: Normal rate and regular rhythm.  Pulmonary:     Effort: No respiratory distress.     Breath sounds: Normal breath sounds. No wheezing.  Abdominal:     General: Bowel sounds are normal.     Palpations: Abdomen is soft.     Tenderness: There is no abdominal tenderness.  Musculoskeletal:        General: No swelling or tenderness.     Cervical back: Neck supple. No tenderness.  Lymphadenopathy:     Cervical: No cervical adenopathy.  Skin:    Findings: No erythema or rash.  Neurological:     Mental Status: She is alert.  Psychiatric:        Mood and Affect: Mood normal.        Behavior: Behavior normal.      Outpatient Encounter Medications as of 08/10/2022  Medication Sig   Azelastine-Fluticasone 137-50 MCG/ACT SUSP SPRAY 2 SPRAYS INTO EACH NOSTRIL EVERY DAY   clonazePAM (KLONOPIN) 0.5 MG tablet Take 1 tablet (0.5 mg total) by mouth 3 (three) times daily as needed for  anxiety.   escitalopram (LEXAPRO) 20 MG tablet Take 20 mg by mouth every morning.    levothyroxine (SYNTHROID) 125 MCG tablet TAKE 1 TABLET EVERY DAY ON EMPTY STOMACHWITH A GLASS OF WATER AT LEAST 30-60 MINBEFORE BREAKFAST   loperamide (IMODIUM) 2 MG capsule Take by mouth. (Patient not taking: Reported on 08/03/2022)   metoprolol tartrate (LOPRESSOR) 25 MG tablet TAKE 1 TABLET (25 MG TOTAL) BY MOUTH AS NEEDED (AS NEEDED UP TO TWICE DAILY FOR PALPITATIONS).   olutasidenib (REZLIDHIA) 150 MG capsule Take 1 capsule (150 mg total) by mouth daily. Take on an empty stomach at least 1 hour before or 2 hours after a meal.   ondansetron (ZOFRAN) 4 MG tablet Take 1 tablet (4 mg total) by mouth 2 (two) times daily. Take prior to Rezlidhia (oral chemotherapy) dose.   pantoprazole (PROTONIX) 40 MG tablet TAKE ONE TABLET (40 MG) BY MOUTH EVERY DAY   potassium chloride SA (KLOR-CON M) 20 MEQ tablet Take 1 tablet (20 mEq total) by mouth daily.   prochlorperazine (COMPAZINE) 10 MG tablet Take 1 tablet (10 mg total) by mouth every 6 (six) hours as needed for nausea or vomiting.   valACYclovir (VALTREX) 500 MG tablet Take 1 tablet (500 mg total) by mouth daily.   [DISCONTINUED] chlorhexidine (PERIDEX) 0.12 % solution SMARTSIG:By Mouth (Patient not taking: Reported on 08/03/2022)   [DISCONTINUED] diphenoxylate-atropine (LOMOTIL) 2.5-0.025 MG tablet Take 1 tablet by mouth 4 (four) times daily as needed for diarrhea or loose stools. (Patient not taking: Reported on 05/14/2022)   [DISCONTINUED] hydrocortisone 2.5 % lotion Apply  topically 2 (two) times daily. Apply areas of back and stomach that itch. (Patient not taking: Reported on 07/06/2022)   [DISCONTINUED] magic mouthwash w/lidocaine SOLN Take 10 mLs by mouth 4 (four) times daily as needed for mouth pain. 30ml diphenhydramine; 30ml Maalox; 20ml Lidocaine (Patient not taking: Reported on 05/14/2022)   No facility-administered encounter medications on file as of 08/10/2022.      Lab Results  Component Value Date   WBC 4.3 08/03/2022   HGB 9.6 (L) 08/03/2022   HCT 30.9 (L) 08/03/2022   PLT 188 08/03/2022   GLUCOSE 124 (H) 08/03/2022   CHOL 163 02/20/2020   TRIG 110.0 02/20/2020   HDL 40.70 02/20/2020   LDLDIRECT 126.0 07/11/2015   LDLCALC 100 (H) 02/20/2020   ALT 42 08/03/2022   AST 20 08/03/2022   NA 139 08/03/2022   K 4.2 08/03/2022   CL 107 08/03/2022   CREATININE 0.86 08/03/2022   BUN 16 08/03/2022   CO2 25 08/03/2022   TSH 0.034 (L) 10/02/2020   INR 1.3 (H) 04/04/2022   HGBA1C 5.3 12/10/2016    No results found.     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  Dose reduced due to nausea and thrombocytopenia.  Both improved.    Anxiety Assessment & Plan: Continue lexapro.  Continue f/u with Dr Lenis Noon    Chemotherapy-induced neuropathy Assessment & Plan: Stable.    Chronic diastolic CHF (congestive heart failure) Assessment & Plan: 2D echo on 09/05/2020 showed EF 60-65% with grade 1 diastolic dysfunction. No evidence of volume overload on exam.  Breathing stable.    Gastroesophageal reflux disease, unspecified whether esophagitis present Assessment & Plan: No upper symptoms reported. On protonix.    History of breast cancer Assessment & Plan: Mammogram 07/29/21 - Birads II. Due f/u mammogram.  Need to schedule.    History of malignant neoplasm of large intestine Assessment & Plan: Colonoscopy 10/2017.  Being followed by GI. Was due f/u 2020.  Had colonoscopy scheduled.  Canceled due to hip surgery.  Overdue f/u.  Currently being treated for AML.  Persistent loose stool.  Plan f/u with GI.    Hypercholesterolemia Assessment & Plan: Follow lipid panel.    Hypothyroidism, unspecified type Assessment & Plan: On thyroid replacement.   Follow tsh.    Major depressive disorder, single episode, mild Assessment & Plan: Seeing Dr Lenis Noon.  Currently on lexapro and clonazepam.  Denies SI.  Follow.  Continue f/u with psychiatry.    Pancytopenia Assessment & Plan: Hematology following. Receiving chemo.       Dale , MD

## 2022-08-16 ENCOUNTER — Encounter: Payer: Self-pay | Admitting: Internal Medicine

## 2022-08-16 NOTE — Assessment & Plan Note (Signed)
Stable

## 2022-08-16 NOTE — Assessment & Plan Note (Signed)
On thyroid replacement.  Follow tsh.  

## 2022-08-16 NOTE — Assessment & Plan Note (Signed)
Colonoscopy 10/2017.  Being followed by GI. Was due f/u 2020.  Had colonoscopy scheduled.  Canceled due to hip surgery.  Overdue f/u.  Currently being treated for AML.  Persistent loose stool.  Plan f/u with GI.

## 2022-08-16 NOTE — Assessment & Plan Note (Signed)
Mammogram 07/29/21 - Birads II. Due f/u mammogram.  Need to schedule.

## 2022-08-16 NOTE — Assessment & Plan Note (Signed)
No upper symptoms reported.  On protonix.   

## 2022-08-16 NOTE — Assessment & Plan Note (Signed)
Continue lexapro.  Continue f/u with Dr Levine  

## 2022-08-16 NOTE — Assessment & Plan Note (Signed)
Seeing Dr Levine.  Currently on lexapro and clonazepam.  Denies SI.  Follow.  Continue f/u with psychiatry.  

## 2022-08-16 NOTE — Assessment & Plan Note (Signed)
2D echo on 09/05/2020 showed EF 60-65% with grade 1 diastolic dysfunction. No evidence of volume overload on exam.  Breathing stable.

## 2022-08-16 NOTE — Assessment & Plan Note (Signed)
Hematology following. Receiving chemo.

## 2022-08-16 NOTE — Assessment & Plan Note (Signed)
Follow lipid panel.   

## 2022-08-16 NOTE — Assessment & Plan Note (Signed)
Last saw Dr Orlie Dakin 08/03/22 - f/u AML - on rezlidhia.  Dose reduced due to nausea and thrombocytopenia.  Both improved.

## 2022-08-17 ENCOUNTER — Other Ambulatory Visit: Payer: Self-pay

## 2022-08-17 ENCOUNTER — Inpatient Hospital Stay: Payer: PPO

## 2022-08-17 DIAGNOSIS — C9200 Acute myeloblastic leukemia, not having achieved remission: Secondary | ICD-10-CM | POA: Diagnosis not present

## 2022-08-17 DIAGNOSIS — E039 Hypothyroidism, unspecified: Secondary | ICD-10-CM

## 2022-08-17 LAB — CBC WITH DIFFERENTIAL/PLATELET
Abs Immature Granulocytes: 0.03 10*3/uL (ref 0.00–0.07)
Basophils Absolute: 0 10*3/uL (ref 0.0–0.1)
Basophils Relative: 1 %
Eosinophils Absolute: 0.1 10*3/uL (ref 0.0–0.5)
Eosinophils Relative: 6 %
HCT: 30.5 % — ABNORMAL LOW (ref 36.0–46.0)
Hemoglobin: 9.7 g/dL — ABNORMAL LOW (ref 12.0–15.0)
Immature Granulocytes: 1 %
Lymphocytes Relative: 15 %
Lymphs Abs: 0.4 10*3/uL — ABNORMAL LOW (ref 0.7–4.0)
MCH: 28.4 pg (ref 26.0–34.0)
MCHC: 31.8 g/dL (ref 30.0–36.0)
MCV: 89.4 fL (ref 80.0–100.0)
Monocytes Absolute: 0.1 10*3/uL (ref 0.1–1.0)
Monocytes Relative: 6 %
Neutro Abs: 1.8 10*3/uL (ref 1.7–7.7)
Neutrophils Relative %: 71 %
Platelets: 126 10*3/uL — ABNORMAL LOW (ref 150–400)
RBC: 3.41 MIL/uL — ABNORMAL LOW (ref 3.87–5.11)
RDW: 15.9 % — ABNORMAL HIGH (ref 11.5–15.5)
Smear Review: NORMAL
WBC: 2.5 10*3/uL — ABNORMAL LOW (ref 4.0–10.5)
nRBC: 0 % (ref 0.0–0.2)

## 2022-08-17 LAB — COMPREHENSIVE METABOLIC PANEL
ALT: 36 U/L (ref 0–44)
AST: 15 U/L (ref 15–41)
Albumin: 4.1 g/dL (ref 3.5–5.0)
Alkaline Phosphatase: 70 U/L (ref 38–126)
Anion gap: 7 (ref 5–15)
BUN: 17 mg/dL (ref 8–23)
CO2: 26 mmol/L (ref 22–32)
Calcium: 8.6 mg/dL — ABNORMAL LOW (ref 8.9–10.3)
Chloride: 106 mmol/L (ref 98–111)
Creatinine, Ser: 0.96 mg/dL (ref 0.44–1.00)
GFR, Estimated: >60 mL/min (ref >60)
Glucose, Bld: 99 mg/dL (ref 70–99)
Potassium: 3.5 mmol/L (ref 3.5–5.1)
Sodium: 139 mmol/L (ref 135–145)
Total Bilirubin: 0.8 mg/dL (ref 0.3–1.2)
Total Protein: 6.3 g/dL — ABNORMAL LOW (ref 6.5–8.1)

## 2022-08-17 LAB — TSH: TSH: 0.021 u[IU]/mL — ABNORMAL LOW (ref 0.350–4.500)

## 2022-08-18 ENCOUNTER — Other Ambulatory Visit (HOSPITAL_COMMUNITY): Payer: Self-pay

## 2022-08-27 ENCOUNTER — Telehealth: Payer: Self-pay | Admitting: Internal Medicine

## 2022-08-27 DIAGNOSIS — E039 Hypothyroidism, unspecified: Secondary | ICD-10-CM

## 2022-08-27 MED ORDER — LEVOTHYROXINE SODIUM 112 MCG PO TABS: 112.0000 ug | ORAL_TABLET | Freq: Every day | ORAL | 0 refills | Status: DC

## 2022-08-27 NOTE — Telephone Encounter (Signed)
Pt aware of below. Decreased synthroid to 112 mcg. Sent to [facility]. Scheduled for TSH and lab ordered.

## 2022-08-27 NOTE — Telephone Encounter (Signed)
Please review recent tsh. She is taking synthroid 125 mcg

## 2022-08-27 NOTE — Telephone Encounter (Signed)
Pt called stating she had her thyroid checked by another doctor and he stated her TSH is off and her hair is falling out . Pt would like to be called

## 2022-08-27 NOTE — Telephone Encounter (Signed)
Reviewed tsh. Confirm she is on q day.  If so, then decrease to q day.  Recheck tsh in 6 weeks.

## 2022-08-31 ENCOUNTER — Other Ambulatory Visit: Payer: Self-pay

## 2022-08-31 ENCOUNTER — Telehealth: Payer: Self-pay | Admitting: Internal Medicine

## 2022-08-31 NOTE — Telephone Encounter (Signed)
Contacted Debra Hale to schedule their annual wellness visit. Appointment made for 09/18/2022.  Verlee Rossetti; Care Guide Ambulatory Clinical Support [place] [facility] Direct Dial: [phone]

## 2022-09-01 ENCOUNTER — Inpatient Hospital Stay: Payer: PPO | Admitting: Pharmacist

## 2022-09-01 ENCOUNTER — Encounter: Payer: Self-pay | Admitting: Pharmacist

## 2022-09-01 ENCOUNTER — Inpatient Hospital Stay: Payer: PPO

## 2022-09-01 VITALS — Wt 117.9 lb

## 2022-09-01 DIAGNOSIS — C9200 Acute myeloblastic leukemia, not having achieved remission: Secondary | ICD-10-CM

## 2022-09-01 LAB — CBC WITH DIFFERENTIAL/PLATELET
Abs Immature Granulocytes: 0.06 10*3/uL (ref 0.00–0.07)
Basophils Absolute: 0 10*3/uL (ref 0.0–0.1)
Basophils Relative: 1 %
Eosinophils Absolute: 0.1 10*3/uL (ref 0.0–0.5)
Eosinophils Relative: 4 %
HCT: 28.8 % — ABNORMAL LOW (ref 36.0–46.0)
Hemoglobin: 9.4 g/dL — ABNORMAL LOW (ref 12.0–15.0)
Immature Granulocytes: 3 %
Lymphocytes Relative: 18 %
Lymphs Abs: 0.3 10*3/uL — ABNORMAL LOW (ref 0.7–4.0)
MCH: 29.2 pg (ref 26.0–34.0)
MCHC: 32.6 g/dL (ref 30.0–36.0)
MCV: 89.4 fL (ref 80.0–100.0)
Monocytes Absolute: 0.1 10*3/uL (ref 0.1–1.0)
Monocytes Relative: 6 %
Neutro Abs: 1.3 10*3/uL — ABNORMAL LOW (ref 1.7–7.7)
Neutrophils Relative %: 68 %
Platelets: 77 10*3/uL — ABNORMAL LOW (ref 150–400)
RBC: 3.22 MIL/uL — ABNORMAL LOW (ref 3.87–5.11)
RDW: 16.8 % — ABNORMAL HIGH (ref 11.5–15.5)
Smear Review: NORMAL
WBC: 1.9 10*3/uL — ABNORMAL LOW (ref 4.0–10.5)
nRBC: 0 % (ref 0.0–0.2)

## 2022-09-01 LAB — COMPREHENSIVE METABOLIC PANEL
ALT: 30 U/L (ref 0–44)
AST: 16 U/L (ref 15–41)
Albumin: 4 g/dL (ref 3.5–5.0)
Alkaline Phosphatase: 62 U/L (ref 38–126)
Anion gap: 8 (ref 5–15)
BUN: 14 mg/dL (ref 8–23)
CO2: 26 mmol/L (ref 22–32)
Calcium: 8.6 mg/dL — ABNORMAL LOW (ref 8.9–10.3)
Chloride: 106 mmol/L (ref 98–111)
Creatinine, Ser: 0.96 mg/dL (ref 0.44–1.00)
GFR, Estimated: >60 mL/min (ref >60)
Glucose, Bld: 105 mg/dL — ABNORMAL HIGH (ref 70–99)
Potassium: 3.8 mmol/L (ref 3.5–5.1)
Sodium: 140 mmol/L (ref 135–145)
Total Bilirubin: 0.8 mg/dL (ref 0.3–1.2)
Total Protein: 6.3 g/dL — ABNORMAL LOW (ref 6.5–8.1)

## 2022-09-01 NOTE — Progress Notes (Signed)
Oral Chemotherapy Clinic [facility]  [phone] [phone]  Patient Care Team: Dale G. L. Garcia, MD as PCP - General (Internal Medicine) Iran Ouch, MD as PCP - Cardiology (Cardiology) Jeralyn Ruths, MD as Consulting Physician (Oncology)   Name of the patient: Debra Hale  [id number]  11/06/1950   Date of visit: 09/01/22  HPI: Patient is a 72 y.o. female with AML. Previously treated with azacitadine and venetoclax. A repeat BM biopsy showed disease progression. 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 She was dose reduced to olutasidenib 100mg  daily on 06/22/22 due to decreased pltc.   Reason for Consult: Oral chemotherapy follow-up for Rezlidhia (olutasidenib) therapy.   PAST MEDICAL HISTORY: Past Medical History:  Diagnosis Date   Abdominal pain 03/06/2016   Acute pericarditis 05/31/2013   Anxiety    Arthritis    Osteoarthritis   Breast cancer (HCC) 2009   right breast lumpectomy with rad tx   Colon cancer (HCC)    surgery with chemo and rad tx   Complication of anesthesia    GERD (gastroesophageal reflux disease)    Heart palpitations    History of hiatal hernia    History of kidney stones    History of thyroid cancer 09/18/2008   Qualifier: Diagnosis of   By: Denyse Amass CMA, Carol       Hypothyroidism    Liver disease    Liver nodule    s/p negative biopsy   Malignant neoplasm of thyroid gland (HCC) 2002   s/p surgery and XRT   Osteoporosis    Other and unspecified hyperlipidemia    Palpitations    Personal history of chemotherapy    Personal history of malignant neoplasm of large intestine    carcinoma - cecum, s/p right laparoscopic colectomy - s/p chemotherapy and XRT   Personal history of radiation therapy    Pneumonia 2019   PONV (postoperative nausea and vomiting)    Pure hypercholesterolemia    Unspecified hereditary and idiopathic peripheral  neuropathy     HEMATOLOGY/ONCOLOGY HISTORY:  Oncology History  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 Initial Diagnosis   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HCC)   06/10/2020 - 12/19/2021 Chemotherapy   Patient is on Treatment Plan : AML dose reduced azacitidine SQ D1-5 q28d     06/10/2020 - 02/13/2022 Chemotherapy   Patient is on Treatment Plan : AML Azacitidine SQ D1-5 q28d       ALLERGIES:  is allergic to demeclocycline, sulfa antibiotics, tetracyclines & related, levofloxacin, augmentin [amoxicillin-pot clavulanate], bentyl [dicyclomine hcl], ciprofloxacin, codeine, dicyclomine hcl, epinephrine, flagyl [metronidazole], librax [chlordiazepoxide-clidinium], novocain [procaine], phenobarbital, prednisone, and ultram [tramadol].  MEDICATIONS:  Current Outpatient Medications  Medication Sig Dispense Refill   levothyroxine (SYNTHROID) 112 MCG tablet Take 1 tablet (112 mcg total) by mouth daily. 90 tablet 0   Azelastine-Fluticasone 137-50 MCG/ACT SUSP SPRAY 2 SPRAYS INTO EACH NOSTRIL EVERY DAY 23 g 2   clonazePAM (KLONOPIN) 0.5 MG tablet Take 1 tablet (0.5 mg total) by mouth 3 (three) times daily as needed for anxiety. 30 tablet 0   escitalopram (LEXAPRO) 20 MG tablet Take 20 mg by mouth every morning.      loperamide (IMODIUM) 2 MG capsule Take by mouth. (Patient not taking: Reported on 08/03/2022)     metoprolol tartrate (LOPRESSOR) 25 MG tablet TAKE 1 TABLET (25 MG TOTAL) BY MOUTH AS NEEDED (AS NEEDED UP TO TWICE DAILY FOR PALPITATIONS). 60 tablet 1   olutasidenib (  REZLIDHIA) 150 MG capsule Take 1 capsule (150 mg total) by mouth daily. Take on an empty stomach at least 1 hour before or 2 hours after a meal. 30 capsule 2   ondansetron (ZOFRAN) 4 MG tablet Take 1 tablet (4 mg total) by mouth 2 (two) times daily. Take prior to Rezlidhia (oral chemotherapy) dose. 60 tablet 2   pantoprazole (PROTONIX) 40 MG tablet TAKE ONE TABLET (40 MG) BY MOUTH EVERY DAY 90 tablet 2   potassium  chloride SA (KLOR-CON M) 20 MEQ tablet Take 1 tablet (20 mEq total) by mouth daily. 30 tablet 2   prochlorperazine (COMPAZINE) 10 MG tablet Take 1 tablet (10 mg total) by mouth every 6 (six) hours as needed for nausea or vomiting. 30 tablet 3   valACYclovir (VALTREX) 500 MG tablet Take 1 tablet (500 mg total) by mouth daily. 30 tablet 0   No current facility-administered medications for this visit.    VITAL SIGNS: Wt 53.5 kg (117 lb 14.4 oz)   BMI 20.24 kg/m  Filed Weights   09/01/22 1026  Weight: 53.5 kg (117 lb 14.4 oz)    Estimated body mass index is 20.24 kg/m as calculated from the following:   Height as of 08/10/22: 5\' 4"  (1.626 m).   Weight as of this encounter: 53.5 kg (117 lb 14.4 oz).  LABS: CBC:    Component Value Date/Time   WBC 1.9 (L) 09/01/2022 1013   HGB 9.4 (L) 09/01/2022 1013   HGB 12.5 05/09/2018 1147   HCT 28.8 (L) 09/01/2022 1013   HCT 36.8 05/09/2018 1147   PLT 77 (L) 09/01/2022 1013   PLT 285 05/09/2018 1147   MCV 89.4 09/01/2022 1013   MCV 86 05/09/2018 1147   MCV 89 11/30/2013 1419   NEUTROABS 1.3 (L) 09/01/2022 1013   NEUTROABS 2.9 11/20/2013 1404   LYMPHSABS 0.3 (L) 09/01/2022 1013   LYMPHSABS 0.9 (L) 11/20/2013 1404   MONOABS 0.1 09/01/2022 1013   MONOABS 0.2 11/20/2013 1404   EOSABS 0.1 09/01/2022 1013   EOSABS 0.1 11/20/2013 1404   BASOSABS 0.0 09/01/2022 1013   BASOSABS 0.0 11/20/2013 1404   Comprehensive Metabolic Panel:    Component Value Date/Time   NA 140 09/01/2022 1013   NA 143 05/09/2018 1147   NA 145 11/30/2013 1419   K 3.8 09/01/2022 1013   K 3.5 11/30/2013 1419   CL 106 09/01/2022 1013   CL 109 (H) 11/30/2013 1419   CO2 26 09/01/2022 1013   CO2 29 11/30/2013 1419   BUN 14 09/01/2022 1013   BUN 11 05/09/2018 1147   BUN 15 11/30/2013 1419   CREATININE 0.96 09/01/2022 1013   CREATININE 1.04 11/30/2013 1419   GLUCOSE 105 (H) 09/01/2022 1013   GLUCOSE 97 11/30/2013 1419   CALCIUM 8.6 (L) 09/01/2022 1013   CALCIUM 8.7  11/30/2013 1419   AST 16 09/01/2022 1013   AST 28 11/30/2013 1419   ALT 30 09/01/2022 1013   ALT 71 (H) 11/30/2013 1419   ALKPHOS 62 09/01/2022 1013   ALKPHOS 114 11/30/2013 1419   BILITOT 0.8 09/01/2022 1013   BILITOT 0.7 11/30/2013 1419   PROT 6.3 (L) 09/01/2022 1013   PROT 7.1 11/30/2013 1419   ALBUMIN 4.0 09/01/2022 1013   ALBUMIN 3.3 (L) 11/30/2013 1419     Present during today's visit: patient only  Assessment and Plan: CMP/CBC reviewed, continue reduced dose olutasidenib 150mg  daily  Pltc and ANC trending down, will continue to monitor Patient recently had her TSH  checked and required a dose reduction in her levothyroxine, this could have been the cause of her recent hair loss   Oral Chemotherapy Side Effect/Intolerance:  Nausea: manageable, reports taking anti-emetic daily Diarrhea: present but improved, back to her baseline amount of diarrhea Taste changes and fatigue: improved/resolved   Oral Chemotherapy Adherence: no missed doses reported There are no patient barriers to medication adherence identified.   New medications: None reported   Medication Access Issues: No issues, patient fills at [facility].   Patient expressed understanding and was in agreement with this plan. She also understands that She can call clinic at any time with any questions, concerns, or complaints.   Follow-up plan: RTC in 2 week for labs and 4 weeks lab/MD.   Thank you for allowing me to participate in the care of this very pleasant patient.   Time Total: 15 minutes  Visit consisted of counseling and education on dealing with issues of symptom management in the setting of serious and potentially life-threatening illness.Greater than 50%  of this time was spent counseling and coordinating care related to the above assessment and plan.  Signed by: Remi Haggard, PharmD, BCPS, Nolon Bussing, CPP Hematology/Oncology Clinical Pharmacist Practitioner [place]/DB/AP Oral  Chemotherapy Navigation Clinic [phone]  09/01/2022 3:00 PM

## 2022-09-02 ENCOUNTER — Other Ambulatory Visit: Payer: Self-pay

## 2022-09-03 ENCOUNTER — Other Ambulatory Visit: Payer: Self-pay | Admitting: Pharmacist

## 2022-09-03 ENCOUNTER — Other Ambulatory Visit: Payer: Self-pay | Admitting: Internal Medicine

## 2022-09-03 DIAGNOSIS — C9200 Acute myeloblastic leukemia, not having achieved remission: Secondary | ICD-10-CM

## 2022-09-04 ENCOUNTER — Telehealth: Payer: Self-pay | Admitting: Internal Medicine

## 2022-09-04 ENCOUNTER — Other Ambulatory Visit: Payer: Self-pay

## 2022-09-04 NOTE — Telephone Encounter (Signed)
Copied from CRM [id number]. Topic: Medicare AWV >> Sep 04, 2022  8:58 AM Payton Doughty wrote: Reason for CRM: Called patient to schedule Medicare Annual Wellness Visit (AWV). No voicemail available to leave a message.CHANGING AWV APPT TO TELEPHONE VISIT.  NEW [facility] WORKS FOR HOME.  PLEASE CONFIRM CHANGE [facility]     Please schedule an appointment at any time with [facility].   .  If any questions, please contact me.  Thank you ,  Verlee Rossetti; Care Guide Ambulatory Clinical Support [place] [facility] Direct Dial: [phone]

## 2022-09-14 ENCOUNTER — Other Ambulatory Visit: Payer: PPO

## 2022-09-15 ENCOUNTER — Inpatient Hospital Stay: Payer: PPO | Attending: Oncology

## 2022-09-15 ENCOUNTER — Ambulatory Visit: Payer: PPO

## 2022-09-15 ENCOUNTER — Other Ambulatory Visit (HOSPITAL_COMMUNITY): Payer: Self-pay

## 2022-09-15 ENCOUNTER — Ambulatory Visit: Payer: PPO | Admitting: Oncology

## 2022-09-15 DIAGNOSIS — Z85038 Personal history of other malignant neoplasm of large intestine: Secondary | ICD-10-CM | POA: Diagnosis not present

## 2022-09-15 DIAGNOSIS — Z800 Family history of malignant neoplasm of digestive organs: Secondary | ICD-10-CM | POA: Insufficient documentation

## 2022-09-15 DIAGNOSIS — Z85850 Personal history of malignant neoplasm of thyroid: Secondary | ICD-10-CM | POA: Diagnosis not present

## 2022-09-15 DIAGNOSIS — Z853 Personal history of malignant neoplasm of breast: Secondary | ICD-10-CM | POA: Diagnosis not present

## 2022-09-15 DIAGNOSIS — Z8042 Family history of malignant neoplasm of prostate: Secondary | ICD-10-CM | POA: Diagnosis not present

## 2022-09-15 DIAGNOSIS — Z803 Family history of malignant neoplasm of breast: Secondary | ICD-10-CM | POA: Diagnosis not present

## 2022-09-15 DIAGNOSIS — C9200 Acute myeloblastic leukemia, not having achieved remission: Secondary | ICD-10-CM | POA: Insufficient documentation

## 2022-09-15 DIAGNOSIS — Z801 Family history of malignant neoplasm of trachea, bronchus and lung: Secondary | ICD-10-CM | POA: Insufficient documentation

## 2022-09-15 LAB — CBC WITH DIFFERENTIAL/PLATELET
Abs Immature Granulocytes: 0 10*3/uL (ref 0.00–0.07)
Basophils Absolute: 0 10*3/uL (ref 0.0–0.1)
Basophils Relative: 1 %
Eosinophils Absolute: 0.1 10*3/uL (ref 0.0–0.5)
Eosinophils Relative: 4 %
HCT: 30.7 % — ABNORMAL LOW (ref 36.0–46.0)
Hemoglobin: 10.3 g/dL — ABNORMAL LOW (ref 12.0–15.0)
Immature Granulocytes: 0 %
Lymphocytes Relative: 25 %
Lymphs Abs: 0.5 10*3/uL — ABNORMAL LOW (ref 0.7–4.0)
MCH: 30.7 pg (ref 26.0–34.0)
MCHC: 33.6 g/dL (ref 30.0–36.0)
MCV: 91.6 fL (ref 80.0–100.0)
Monocytes Absolute: 0.1 10*3/uL (ref 0.1–1.0)
Monocytes Relative: 6 %
Neutro Abs: 1.3 10*3/uL — ABNORMAL LOW (ref 1.7–7.7)
Neutrophils Relative %: 64 %
Platelets: 77 10*3/uL — ABNORMAL LOW (ref 150–400)
RBC: 3.35 MIL/uL — ABNORMAL LOW (ref 3.87–5.11)
RDW: 20.6 % — ABNORMAL HIGH (ref 11.5–15.5)
Smear Review: NORMAL
WBC: 2 10*3/uL — ABNORMAL LOW (ref 4.0–10.5)
nRBC: 0 % (ref 0.0–0.2)

## 2022-09-15 LAB — COMPREHENSIVE METABOLIC PANEL
ALT: 28 U/L (ref 0–44)
AST: 16 U/L (ref 15–41)
Albumin: 4.2 g/dL (ref 3.5–5.0)
Alkaline Phosphatase: 70 U/L (ref 38–126)
Anion gap: 8 (ref 5–15)
BUN: 14 mg/dL (ref 8–23)
CO2: 27 mmol/L (ref 22–32)
Calcium: 8.7 mg/dL — ABNORMAL LOW (ref 8.9–10.3)
Chloride: 108 mmol/L (ref 98–111)
Creatinine, Ser: 0.85 mg/dL (ref 0.44–1.00)
GFR, Estimated: >60 mL/min (ref >60)
Glucose, Bld: 101 mg/dL — ABNORMAL HIGH (ref 70–99)
Potassium: 3.8 mmol/L (ref 3.5–5.1)
Sodium: 143 mmol/L (ref 135–145)
Total Bilirubin: 0.8 mg/dL (ref 0.3–1.2)
Total Protein: 6.6 g/dL (ref 6.5–8.1)

## 2022-09-17 ENCOUNTER — Other Ambulatory Visit (HOSPITAL_COMMUNITY): Payer: Self-pay

## 2022-09-18 ENCOUNTER — Other Ambulatory Visit: Payer: Self-pay

## 2022-09-18 ENCOUNTER — Ambulatory Visit (INDEPENDENT_AMBULATORY_CARE_PROVIDER_SITE_OTHER): Payer: PPO

## 2022-09-18 DIAGNOSIS — Z0000 Encounter for general adult medical examination without abnormal findings: Secondary | ICD-10-CM

## 2022-09-18 NOTE — Patient Instructions (Signed)

## 2022-09-18 NOTE — Progress Notes (Signed)
I connected with  Debra Hale on 09/18/22 by a audio enabled telemedicine application and verified that I am speaking with the correct person using two identifiers.  Patient Location: Home  Provider Location: Home Office  I discussed the limitations of evaluation and management by telemedicine. The patient expressed understanding and agreed to proceed.   Subjective:   Debra Hale is a 71 y.o. female who presents for Medicare Annual (Subsequent) preventive examination.  Review of Systems    Per HPI unless specifically indicated below.        Objective:       09/01/2022   10:26 AM 08/10/2022    3:49 PM 08/03/2022   10:25 AM  Vitals with BMI  Height  5\' 4"  5\' 4"   Weight 117 lbs 14 oz 115 lbs 10 oz 117 lbs 13 oz  BMI 20.23 19.83 20.21  Systolic  102 107  Diastolic  70 59  Pulse  86 75    There were no vitals filed for this visit. There is no height or weight on file to calculate BMI.     09/18/2022    2:23 PM 08/03/2022   10:26 AM 07/06/2022    9:44 AM 06/22/2022   11:02 AM 06/08/2022    9:58 AM 05/14/2022    2:03 PM 05/04/2022    2:11 PM  Advanced Directives  Does Patient Have a Medical Advance Directive? No No No No No No No  Would patient like information on creating a medical advance directive? Yes (MAU/Ambulatory/Procedural Areas - Information given) No - Patient declined No - Patient declined No - Patient declined No - Patient declined No - Patient declined No - Patient declined    Current Medications (verified) Outpatient Encounter Medications as of 09/18/2022  Medication Sig   Azelastine-Fluticasone 137-50 MCG/ACT SUSP SPRAY 2 SPRAYS INTO EACH NOSTRIL EVERY DAY   clonazePAM (KLONOPIN) 0.5 MG tablet Take 1 tablet (0.5 mg total) by mouth 3 (three) times daily as needed for anxiety.   escitalopram (LEXAPRO) 20 MG tablet Take 20 mg by mouth every morning.    levothyroxine (SYNTHROID) 112 MCG tablet Take 1 tablet (112 mcg total) by mouth daily.   loperamide (IMODIUM)  2 MG capsule Take by mouth.   olutasidenib (REZLIDHIA) 150 MG capsule Take 1 capsule (150 mg total) by mouth daily. Take on an empty stomach at least 1 hour before or 2 hours after a meal.   ondansetron (ZOFRAN) 4 MG tablet Take 1 tablet (4 mg total) by mouth 2 (two) times daily. Take prior to Rezlidhia (oral chemotherapy) dose.   pantoprazole (PROTONIX) 40 MG tablet TAKE ONE TABLET (40 MG) BY MOUTH EVERY DAY   potassium chloride SA (KLOR-CON M) 20 MEQ tablet Take 1 tablet (20 mEq total) by mouth daily.   valACYclovir (VALTREX) 500 MG tablet TAKE ONE TABLET BY MOUTH DAILY   metoprolol tartrate (LOPRESSOR) 25 MG tablet TAKE 1 TABLET (25 MG TOTAL) BY MOUTH AS NEEDED (AS NEEDED UP TO TWICE DAILY FOR PALPITATIONS). (Patient not taking: Reported on 09/18/2022)   prochlorperazine (COMPAZINE) 10 MG tablet Take 1 tablet (10 mg total) by mouth every 6 (six) hours as needed for nausea or vomiting. (Patient not taking: Reported on 09/18/2022)   No facility-administered encounter medications on file as of 09/18/2022.    Allergies (verified) Demeclocycline, Sulfa antibiotics, Tetracyclines & related, Levofloxacin, Augmentin [amoxicillin-pot clavulanate], Bentyl [dicyclomine hcl], Ciprofloxacin, Codeine, Dicyclomine hcl, Epinephrine, Flagyl [metronidazole], Librax [chlordiazepoxide-clidinium], Novocain [procaine], Phenobarbital, Prednisone, and Ultram [tramadol]  History: Past Medical History:  Diagnosis Date   Abdominal pain 03/06/2016   Acute pericarditis 05/31/2013   Anxiety    Arthritis    Osteoarthritis   Breast cancer (HCC) 2009   right breast lumpectomy with rad tx   Colon cancer (HCC)    surgery with chemo and rad tx   Complication of anesthesia    GERD (gastroesophageal reflux disease)    Heart palpitations    History of hiatal hernia    History of kidney stones    History of thyroid cancer 09/18/2008   Qualifier: Diagnosis of   By: Denyse Amass, CMA, Carol       Hypothyroidism    Liver disease     Liver nodule    s/p negative biopsy   Malignant neoplasm of thyroid gland (HCC) 2002   s/p surgery and XRT   Osteoporosis    Other and unspecified hyperlipidemia    Palpitations    Personal history of chemotherapy    Personal history of malignant neoplasm of large intestine    carcinoma - cecum, s/p right laparoscopic colectomy - s/p chemotherapy and XRT   Personal history of radiation therapy    Pneumonia 2019   PONV (postoperative nausea and vomiting)    Pure hypercholesterolemia    Unspecified hereditary and idiopathic peripheral neuropathy    Past Surgical History:  Procedure Laterality Date   APPENDECTOMY  1985   BREAST BIOPSY Right 2009   positive   BREAST BIOPSY Right 2009   negative   BREAST LUMPECTOMY Right 2009   breast cancer   CHOLECYSTECTOMY  1995   COLONOSCOPY     COLONOSCOPY WITH PROPOFOL N/A 10/29/2017   Procedure: COLONOSCOPY WITH PROPOFOL;  Surgeon: Scot Jun, MD;  Location: [facility] ENDOSCOPY;  Service: Endoscopy;  Laterality: N/A;   DILATION AND CURETTAGE OF UTERUS  1990   DILATION AND CURETTAGE, DIAGNOSTIC / THERAPEUTIC  1990   ESOPHAGOGASTRODUODENOSCOPY     ESOPHAGOGASTRODUODENOSCOPY (EGD) WITH PROPOFOL N/A 10/29/2017   Procedure: ESOPHAGOGASTRODUODENOSCOPY (EGD) WITH PROPOFOL;  Surgeon: Scot Jun, MD;  Location: [facility] ENDOSCOPY;  Service: Endoscopy;  Laterality: N/A;   INCISION AND DRAINAGE PERIRECTAL ABSCESS N/A 06/17/2020   Procedure: IRRIGATION AND DEBRIDEMENT PERIRECTAL ABSCESS;  Surgeon: Leafy Ro, MD;  Location: [facility] ORS;  Service: General;  Laterality: N/A;   IR BONE MARROW BIOPSY & ASPIRATION  07/09/2021   LAPAROSCOPIC PARTIAL COLECTOMY     stage 3-C carcinoma of the cecum, s/p chemotherapy and xrt   LITHOTRIPSY     SIGMOIDOSCOPY  08/26/1993   THYROID LOBECTOMY  2002   s/p XRT   TOTAL HIP ARTHROPLASTY Right 10/24/2019   Procedure: TOTAL HIP ARTHROPLASTY;  Surgeon: Christena Flake, MD;  Location: [facility] ORS;  Service: Orthopedics;   Laterality: Right;   Family History  Problem Relation Age of Onset   Stroke Mother        39s   Alzheimer's disease Mother    Lung cancer Father    Prostate cancer Father    Cancer Father        Colon   Colon cancer Father    Breast cancer Sister        63's   Lung cancer Sister    Breast cancer Maternal Aunt    Social History   Socioeconomic History   Marital status: Single    Spouse name: Not on file   Number of children: 0   Years of education: Not on file   Highest education level: Not on  file  Occupational History   Occupation: Retired  Tobacco Use   Smoking status: Never   Smokeless tobacco: Never  Vaping Use   Vaping Use: Never used  Substance and Sexual Activity   Alcohol use: No    Alcohol/week: 0.0 standard drinks of alcohol   Drug use: No   Sexual activity: Not Currently  Other Topics Concern   Not on file  Social History Narrative   Not on file   Social Determinants of Health   Financial Resource Strain: Low Risk  (09/18/2022)   Overall Financial Resource Strain (CARDIA)    Difficulty of Paying Living Expenses: Not hard at all  Food Insecurity: No Food Insecurity (09/18/2022)   Hunger Vital Sign    Worried About Running Out of Food in the Last Year: Never true    Ran Out of Food in the Last Year: Never true  Transportation Needs: No Transportation Needs (09/18/2022)   PRAPARE - Administrator, Civil Service (Medical): No    Lack of Transportation (Non-Medical): No  Physical Activity: Insufficiently Active (09/18/2022)   Exercise Vital Sign    Days of Exercise per Week: 1 day    Minutes of Exercise per Session: 10 min  Stress: Stress Concern Present (09/18/2022)   [employer] of Occupational Health - Occupational Stress Questionnaire    Feeling of Stress : To some extent  Social Connections: Moderately Isolated (09/18/2022)   Social Connection and Isolation Panel [NHANES]    Frequency of Communication with Friends and Family: More  than three times a week    Frequency of Social Gatherings with Friends and Family: Twice a week    Attends Religious Services: 1 to 4 times per year    Active Member of [employer] or Organizations: No    Attends Engineer, structural: Never    Marital Status: Divorced    Tobacco Counseling Counseling given: No   Clinical Intake:  Pre-visit preparation completed: No  Pain : No/denies pain     Nutritional Status: BMI of 19-24  Normal Nutritional Risks: None Diabetes: No  How often do you need to have someone help you when you read instructions, pamphlets, or other written materials from your doctor or pharmacy?: 1 - Never  Diabetic?No  Interpreter Needed?: No  Information entered by :: Laurel Dimmer, CMA   Activities of Daily Living    09/18/2022    2:12 PM 04/07/2022    1:00 AM  In your present state of health, do you have any difficulty performing the following activities:  Hearing? 1   Vision? 0   Difficulty concentrating or making decisions? 1   Walking or climbing stairs? 1   Dressing or bathing? 0   Doing errands, shopping? 0 0    Patient Care Team: Dale East Duke, MD as PCP - General (Internal Medicine) Iran Ouch, MD as PCP - Cardiology (Cardiology) Jeralyn Ruths, MD as Consulting Physician (Oncology)  Indicate any recent Medical Services you may have received from other than [facility] providers in the past year (date may be approximate).     Assessment:   This is a routine wellness examination for Debra Hale.   Hearing/Vision screen Admits to some hearing loss . Denies any change to her vision. Overdue Annual Eye Exam.   Dietary issues and exercise activities discussed: Current Exercise Habits: The patient does not participate in regular exercise at present, Exercise limited by: Other - see comments: Leukemia    Goals Addressed   None  Depression Screen    09/18/2022    2:12 PM 08/10/2022    3:48 PM 03/30/2022    2:13 PM  12/26/2021    3:19 PM 09/23/2021    3:07 PM 09/11/2021    3:17 PM 07/21/2021   11:48 AM  PHQ 2/9 Scores  PHQ - 2 Score 0 2 6 6 3  0 2  PHQ- 9 Score  7 14 10 8  5     Fall Risk    09/18/2022    2:12 PM 03/30/2022    2:13 PM 12/26/2021    3:19 PM 09/23/2021    3:07 PM 06/19/2020    9:39 AM  Fall Risk   Falls in the past year? 0 0 0 0 0  Number falls in past yr: 0 0 0    Injury with Fall? 0 0 0    Risk for fall due to : No Fall Risks No Fall Risks No Fall Risks No Fall Risks   Follow up Falls evaluation completed Falls evaluation completed Falls evaluation completed Falls evaluation completed     FALL RISK PREVENTION PERTAINING TO THE HOME:  Any stairs in or around the home? Yes  If so, are there any without handrails? No  Home free of loose throw rugs in walkways, pet beds, electrical cords, etc? Yes  Adequate lighting in your home to reduce risk of falls? Yes   ASSISTIVE DEVICES UTILIZED TO PREVENT FALLS:  Life alert? No  Use of a cane, walker or w/c? No  Grab bars in the bathroom? Yes  Shower chair or bench in shower? No  Elevated toilet seat or a handicapped toilet? No   TIMED UP AND GO:  Was the test performed?  Cognitive Function:    09/08/2016    3:56 PM  MMSE - Mini Mental State Exam  Orientation to time 5  Orientation to Place 5  Registration 3  Attention/ Calculation 5  Recall 3  Language- name 2 objects 2  Language- repeat 1  Language- follow 3 step command 3  Language- read & follow direction 1  Write a sentence 1  Copy design 1  Total score 30        09/18/2022    2:16 PM 06/07/2019    2:27 PM  6CIT Screen  What Year? 0 points 0 points  What month? 0 points 0 points  What time? 0 points 0 points  Count back from 20 0 points 0 points  Months in reverse 0 points 0 points  Repeat phrase 0 points   Total Score 0 points     Immunizations Immunization History  Administered Date(s) Administered   Hep A / Hep B 10/16/2013   Influenza Split 02/04/2012    Influenza, High Dose Seasonal PF 03/02/2017   Influenza,inj,Quad PF,6+ Mos 01/23/2014, 06/09/2016   Pneumococcal Conjugate-13 01/05/2012    TDAP status: Due, Education has been provided regarding the importance of this vaccine. Advised may receive this vaccine at local pharmacy or Health Dept. Aware to provide a copy of the vaccination record if obtained from local pharmacy or Health Dept. Verbalized acceptance and understanding.  Flu Vaccine status: Up to date  Pneumococcal vaccine status: Due, Education has been provided regarding the importance of this vaccine. Advised may receive this vaccine at local pharmacy or Health Dept. Aware to provide a copy of the vaccination record if obtained from local pharmacy or Health Dept. Verbalized acceptance and understanding.  Covid-19 vaccine status: Information provided on how to obtain vaccines.  Qualifies for Shingles Vaccine? Yes   Zostavax completed No   Shingrix Completed?: No.    Education has been provided regarding the importance of this vaccine. Patient has been advised to call insurance company to determine out of pocket expense if they have not yet received this vaccine. Advised may also receive vaccine at local pharmacy or Health Dept. Verbalized acceptance and understanding.  Screening Tests Health Maintenance  Topic Date Due   DTaP/Tdap/Td (1 - Tdap) Never done   MAMMOGRAM  07/30/2022   Zoster Vaccines- Shingrix (1 of 2) 11/09/2022 (Originally 11/15/1969)   Hepatitis C Screening  03/31/2023 (Originally 11/16/1968)   Pneumonia Vaccine 7+ Years old (2 of 2 - PPSV23 or PCV20) 08/10/2023 (Originally 03/01/2012)   COLONOSCOPY (Pts 45-71yrs Insurance coverage will need to be confirmed)  09/18/2023 (Originally 10/29/2020)   INFLUENZA VACCINE  12/03/2022   Medicare Annual Wellness (AWV)  09/18/2023   DEXA SCAN  Completed   HPV VACCINES  Aged Out   COVID-19 Vaccine  Discontinued    Health Maintenance  Health Maintenance Due  Topic  Date Due   DTaP/Tdap/Td (1 - Tdap) Never done   MAMMOGRAM  07/30/2022   Colorectal Cancer Screening: declined scheduling colonoscopy, and cologuard.   Mammogram status: Ordered 08/10/2022. Pt provided with contact info and advised to call to schedule appt.   DEXA Scan: completed 07/29/2021  Lung Cancer Screening: (Low Dose CT Chest recommended if Age 76-80 years, 30 pack-year currently smoking OR have quit w/in 15years.) does not qualify.   Lung Cancer Screening Referral: not applicable   Additional Screening:  Hepatitis C Screening: does qualify; postponed   Vision Screening: Recommended annual ophthalmology exams for early detection of glaucoma and other disorders of the eye. Is the patient up to date with their annual eye exam?  No  Who is the provider or what is the name of the office in which the patient attends annual eye exams? Declined  If pt is not established with a provider, would they like to be referred to a provider to establish care? No .   Dental Screening: Recommended annual dental exams for proper oral hygiene  Community Resource Referral / Chronic Care Management: CRR required this visit?  No   CCM required this visit?  No      Plan:     I have personally reviewed and noted the following in the patient's chart:   Medical and social history Use of alcohol, tobacco or illicit drugs  Current medications and supplements including opioid prescriptions. Patient is not currently taking opioid prescriptions. Functional ability and status Nutritional status Physical activity Advanced directives List of other physicians Hospitalizations, surgeries, and ER visits in previous 12 months Vitals Screenings to include cognitive, depression, and falls Referrals and appointments  In addition, I have reviewed and discussed with patient certain preventive protocols, quality metrics, and best practice recommendations. A written personalized care plan for preventive  services as well as general preventive health recommendations were provided to patient.     Debra Hale , Thank you for taking time to come for your Medicare Wellness Visit. I appreciate your ongoing commitment to your health goals. Please review the following plan we discussed and let me know if I can assist you in the future.   These are the goals we discussed:  Goals       Discount for recently prescribed antibiotic prescription      Care Coordination Interventions: Evaluation of current treatment plan related to urinary tract infection and  patient's adherence to plan as established by provider Advised patient to call her [employer] concierge to inquire if she can get any reimbursement for her prescribed antibiotic since provider authorized. Reviewed medications with patient and discussed importance of completing her prescription even if feeling better from urinary tract infection Reviewed scheduled/upcoming provider appointments I Advised to contact her provider if she continues to have ongoing urinary tract infection symptoms.          Follow up with Primary Care Provider (pt-stated)      As needed.        This is a list of the screening recommended for you and due dates:  Health Maintenance  Topic Date Due   DTaP/Tdap/Td vaccine (1 - Tdap) Never done   Mammogram  07/30/2022   Zoster (Shingles) Vaccine (1 of 2) 11/09/2022*   Hepatitis C Screening: USPSTF Recommendation to screen - Ages 18-79 yo.  03/31/2023*   Pneumonia Vaccine (2 of 2 - PPSV23 or PCV20) 08/10/2023*   Colon Cancer Screening  09/18/2023*   Flu Shot  12/03/2022   Medicare Annual Wellness Visit  09/18/2023   DEXA scan (bone density measurement)  Completed   HPV Vaccine  Aged Out   COVID-19 Vaccine  Discontinued  *Topic was postponed. The date shown is not the original due date.    Lonna Cobb, CMA   09/18/2022   Nurse Notes: Approximately 30 minute Non-Face -To-Face Medicare Wellness Visit

## 2022-09-29 ENCOUNTER — Ambulatory Visit: Payer: PPO | Admitting: Oncology

## 2022-09-29 ENCOUNTER — Other Ambulatory Visit: Payer: PPO

## 2022-10-01 ENCOUNTER — Encounter: Payer: Self-pay | Admitting: Oncology

## 2022-10-01 ENCOUNTER — Inpatient Hospital Stay (HOSPITAL_BASED_OUTPATIENT_CLINIC_OR_DEPARTMENT_OTHER): Payer: PPO | Admitting: Oncology

## 2022-10-01 ENCOUNTER — Inpatient Hospital Stay: Payer: PPO

## 2022-10-01 VITALS — BP 132/70 | HR 72 | Temp 97.6°F | Wt 119.7 lb

## 2022-10-01 DIAGNOSIS — C9200 Acute myeloblastic leukemia, not having achieved remission: Secondary | ICD-10-CM | POA: Diagnosis not present

## 2022-10-01 LAB — COMPREHENSIVE METABOLIC PANEL
ALT: 35 U/L (ref 0–44)
AST: 17 U/L (ref 15–41)
Albumin: 3.8 g/dL (ref 3.5–5.0)
Alkaline Phosphatase: 77 U/L (ref 38–126)
Anion gap: 7 (ref 5–15)
BUN: 14 mg/dL (ref 8–23)
CO2: 27 mmol/L (ref 22–32)
Calcium: 8.6 mg/dL — ABNORMAL LOW (ref 8.9–10.3)
Chloride: 105 mmol/L (ref 98–111)
Creatinine, Ser: 0.91 mg/dL (ref 0.44–1.00)
GFR, Estimated: >60 mL/min (ref >60)
Glucose, Bld: 96 mg/dL (ref 70–99)
Potassium: 3.8 mmol/L (ref 3.5–5.1)
Sodium: 139 mmol/L (ref 135–145)
Total Bilirubin: 0.7 mg/dL (ref 0.3–1.2)
Total Protein: 6.3 g/dL — ABNORMAL LOW (ref 6.5–8.1)

## 2022-10-01 LAB — CBC WITH DIFFERENTIAL/PLATELET
Abs Immature Granulocytes: 0.02 10*3/uL (ref 0.00–0.07)
Basophils Absolute: 0 10*3/uL (ref 0.0–0.1)
Basophils Relative: 0 %
Eosinophils Absolute: 0.1 10*3/uL (ref 0.0–0.5)
Eosinophils Relative: 4 %
HCT: 31 % — ABNORMAL LOW (ref 36.0–46.0)
Hemoglobin: 10.5 g/dL — ABNORMAL LOW (ref 12.0–15.0)
Immature Granulocytes: 1 %
Lymphocytes Relative: 24 %
Lymphs Abs: 0.6 10*3/uL — ABNORMAL LOW (ref 0.7–4.0)
MCH: 32.2 pg (ref 26.0–34.0)
MCHC: 33.9 g/dL (ref 30.0–36.0)
MCV: 95.1 fL (ref 80.0–100.0)
Monocytes Absolute: 0.3 10*3/uL (ref 0.1–1.0)
Monocytes Relative: 10 %
Neutro Abs: 1.5 10*3/uL — ABNORMAL LOW (ref 1.7–7.7)
Neutrophils Relative %: 61 %
Platelets: 84 10*3/uL — ABNORMAL LOW (ref 150–400)
RBC: 3.26 MIL/uL — ABNORMAL LOW (ref 3.87–5.11)
RDW: 21.1 % — ABNORMAL HIGH (ref 11.5–15.5)
WBC: 2.5 10*3/uL — ABNORMAL LOW (ref 4.0–10.5)
nRBC: 0 % (ref 0.0–0.2)

## 2022-10-01 NOTE — Progress Notes (Signed)
Patient has a question for Dr. Irving Copas about her hair. And, pt is starting to have more constipation.

## 2022-10-01 NOTE — Progress Notes (Signed)
[facility]  [phone] [phone]  ID: Debra Hale OB: [birth year]  MR#: [id number]  AOZ#:[id number]  Patient Care Team: Dale Wallace, MD as PCP - General (Internal Medicine) Iran Ouch, MD as PCP - Cardiology (Cardiology) Jeralyn Ruths, MD as Consulting Physician (Oncology)  CHIEF COMPLAINT: AML.  INTERVAL HISTORY: Patient returns to clinic today for repeat laboratory work, further evaluation, and continuation of dose reduced Rezlidhia.  Her only complaint today is of persistent hair thinning, but she otherwise feels well.  She does not complain of weakness or fatigue. She denies any pain.  She has no neurologic complaints.  She has no chest pain, shortness of breath, cough, or hemoptysis.  She denies any nausea, vomiting, constipation, or diarrhea.  She has no urinary complaints.  Patient offers no further specific complaints today.  REVIEW OF SYSTEMS:   Review of Systems  Constitutional: Negative.  Negative for fever, malaise/fatigue and weight loss.  HENT:  Negative for congestion and sinus pain.   Respiratory: Negative.  Negative for cough, hemoptysis and shortness of breath.   Cardiovascular: Negative.  Negative for chest pain and leg swelling.  Gastrointestinal: Negative.  Negative for abdominal pain, diarrhea and nausea.  Genitourinary: Negative.  Negative for dysuria.  Musculoskeletal: Negative.  Negative for back pain and falls.  Skin: Negative.  Negative for rash.  Neurological: Negative.  Negative for dizziness, focal weakness, weakness and headaches.  Psychiatric/Behavioral: Negative.  Negative for memory loss. The patient is not nervous/anxious.     As per HPI. Otherwise, a complete review of systems is negative.  PAST MEDICAL HISTORY: Past Medical History:  Diagnosis Date   Abdominal pain 03/06/2016   Acute pericarditis 05/31/2013   Anxiety    Arthritis    Osteoarthritis   Breast cancer (HCC) 2009    right breast lumpectomy with rad tx   Colon cancer (HCC)    surgery with chemo and rad tx   Complication of anesthesia    GERD (gastroesophageal reflux disease)    Heart palpitations    History of hiatal hernia    History of kidney stones    History of thyroid cancer 09/18/2008   Qualifier: Diagnosis of   By: Denyse Amass, CMA, Carol       Hypothyroidism    Liver disease    Liver nodule    s/p negative biopsy   Malignant neoplasm of thyroid gland (HCC) 2002   s/p surgery and XRT   Osteoporosis    Other and unspecified hyperlipidemia    Palpitations    Personal history of chemotherapy    Personal history of malignant neoplasm of large intestine    carcinoma - cecum, s/p right laparoscopic colectomy - s/p chemotherapy and XRT   Personal history of radiation therapy    Pneumonia 2019   PONV (postoperative nausea and vomiting)    Pure hypercholesterolemia    Unspecified hereditary and idiopathic peripheral neuropathy     PAST SURGICAL HISTORY: Past Surgical History:  Procedure Laterality Date   APPENDECTOMY  1985   BREAST BIOPSY Right 2009   positive   BREAST BIOPSY Right 2009   negative   BREAST LUMPECTOMY Right 2009   breast cancer   CHOLECYSTECTOMY  1995   COLONOSCOPY     COLONOSCOPY WITH PROPOFOL N/A 10/29/2017   Procedure: COLONOSCOPY WITH PROPOFOL;  Surgeon: Scot Jun, MD;  Location: [facility] ENDOSCOPY;  Service: Endoscopy;  Laterality: N/A;   DILATION AND CURETTAGE OF UTERUS  1990   DILATION AND  CURETTAGE, DIAGNOSTIC / THERAPEUTIC  1990   ESOPHAGOGASTRODUODENOSCOPY     ESOPHAGOGASTRODUODENOSCOPY (EGD) WITH PROPOFOL N/A 10/29/2017   Procedure: ESOPHAGOGASTRODUODENOSCOPY (EGD) WITH PROPOFOL;  Surgeon: Scot Jun, MD;  Location: [facility] ENDOSCOPY;  Service: Endoscopy;  Laterality: N/A;   INCISION AND DRAINAGE PERIRECTAL ABSCESS N/A 06/17/2020   Procedure: IRRIGATION AND DEBRIDEMENT PERIRECTAL ABSCESS;  Surgeon: Leafy Ro, MD;  Location: [facility] ORS;  Service: General;   Laterality: N/A;   IR BONE MARROW BIOPSY & ASPIRATION  07/09/2021   LAPAROSCOPIC PARTIAL COLECTOMY     stage 3-C carcinoma of the cecum, s/p chemotherapy and xrt   LITHOTRIPSY     SIGMOIDOSCOPY  08/26/1993   THYROID LOBECTOMY  2002   s/p XRT   TOTAL HIP ARTHROPLASTY Right 10/24/2019   Procedure: TOTAL HIP ARTHROPLASTY;  Surgeon: Christena Flake, MD;  Location: [facility] ORS;  Service: Orthopedics;  Laterality: Right;    FAMILY HISTORY: Family History  Problem Relation Age of Onset   Stroke Mother        59s   Alzheimer's disease Mother    Lung cancer Father    Prostate cancer Father    Cancer Father        Colon   Colon cancer Father    Breast cancer Sister        68's   Lung cancer Sister    Breast cancer Maternal Aunt     ADVANCED DIRECTIVES (Y/N):  N  HEALTH MAINTENANCE: Social History   Tobacco Use   Smoking status: Never   Smokeless tobacco: Never  Vaping Use   Vaping Use: Never used  Substance Use Topics   Alcohol use: No    Alcohol/week: 0.0 standard drinks of alcohol   Drug use: No     Colonoscopy:  PAP:  Bone density:  Lipid panel:  Allergies  Allergen Reactions   Demeclocycline Other (See Comments)    Throat swells   Sulfa Antibiotics Other (See Comments)    Other reaction(s): Other (See Comments) Throat swells Other reaction(s): Other (See Comments) Throat swells Throat swells Other reaction(s): Other (See Comments) Throat swells Other reaction(s): Unknown Other reaction(s): Other (See Comments) Throat swells Other reaction(s): Other (See Comments) Other reaction(s): Other (See Comments) Throat swellsOther reaction(s): Other (See Comments) Throat swells   Tetracyclines & Related Other (See Comments)    Throat swells    Levofloxacin Diarrhea    Severe diarrhea   Augmentin [Amoxicillin-Pot Clavulanate] Diarrhea   Bentyl [Dicyclomine Hcl]     unkn   Ciprofloxacin Diarrhea   Codeine Other (See Comments)    dizziness     Dicyclomine Hcl  Other (See Comments)    unkn   Epinephrine     Speeds heart up - panic    Flagyl [Metronidazole] Nausea And Vomiting   Librax [Chlordiazepoxide-Clidinium]     unkn   Novocain [Procaine] Other (See Comments)    "Shaky"   Phenobarbital     unkn   Prednisone     Nervous     Ultram [Tramadol] Other (See Comments)    Sick feeling    Current Outpatient Medications  Medication Sig Dispense Refill   Azelastine-Fluticasone 137-50 MCG/ACT SUSP SPRAY 2 SPRAYS INTO EACH NOSTRIL EVERY DAY 23 g 2   clonazePAM (KLONOPIN) 0.5 MG tablet Take 1 tablet (0.5 mg total) by mouth 3 (three) times daily as needed for anxiety. 30 tablet 0   escitalopram (LEXAPRO) 20 MG tablet Take 20 mg by mouth every morning.  levothyroxine (SYNTHROID) 112 MCG tablet Take 1 tablet (112 mcg total) by mouth daily. 90 tablet 0   loperamide (IMODIUM) 2 MG capsule Take by mouth.     olutasidenib (REZLIDHIA) 150 MG capsule Take 1 capsule (150 mg total) by mouth daily. Take on an empty stomach at least 1 hour before or 2 hours after a meal. 30 capsule 2   pantoprazole (PROTONIX) 40 MG tablet TAKE ONE TABLET (40 MG) BY MOUTH EVERY DAY 90 tablet 2   potassium chloride SA (KLOR-CON M) 20 MEQ tablet Take 1 tablet (20 mEq total) by mouth daily. 30 tablet 2   valACYclovir (VALTREX) 500 MG tablet TAKE ONE TABLET BY MOUTH DAILY 30 tablet 2   metoprolol tartrate (LOPRESSOR) 25 MG tablet TAKE 1 TABLET (25 MG TOTAL) BY MOUTH AS NEEDED (AS NEEDED UP TO TWICE DAILY FOR PALPITATIONS). (Patient not taking: Reported on 09/18/2022) 60 tablet 1   ondansetron (ZOFRAN) 4 MG tablet Take 1 tablet (4 mg total) by mouth 2 (two) times daily. Take prior to Rezlidhia (oral chemotherapy) dose. (Patient not taking: Reported on 10/01/2022) 60 tablet 2   prochlorperazine (COMPAZINE) 10 MG tablet Take 1 tablet (10 mg total) by mouth every 6 (six) hours as needed for nausea or vomiting. (Patient not taking: Reported on 09/18/2022) 30 tablet 3   No current  facility-administered medications for this visit.    OBJECTIVE: Vitals:   10/01/22 1348  BP: 132/70  Pulse: 72  Temp: 97.6 F (36.4 C)  SpO2: 100%     Body mass index is 20.55 kg/m.    ECOG FS:0 - Asymptomatic  General: Well-developed, well-nourished, no acute distress. Eyes: Pink conjunctiva, anicteric sclera. HEENT: Normocephalic, moist mucous membranes. Lungs: No audible wheezing or coughing. Heart: Regular rate and rhythm. Abdomen: Soft, nontender, no obvious distention. Musculoskeletal: No edema, cyanosis, or clubbing. Neuro: Alert, answering all questions appropriately. Cranial nerves grossly intact. Skin: No rashes or petechiae noted. Psych: Normal affect.  LAB RESULTS:  Lab Results  Component Value Date   NA 139 10/01/2022   K 3.8 10/01/2022   CL 105 10/01/2022   CO2 27 10/01/2022   GLUCOSE 96 10/01/2022   BUN 14 10/01/2022   CREATININE 0.91 10/01/2022   CALCIUM 8.6 (L) 10/01/2022   PROT 6.3 (L) 10/01/2022   ALBUMIN 3.8 10/01/2022   AST 17 10/01/2022   ALT 35 10/01/2022   ALKPHOS 77 10/01/2022   BILITOT 0.7 10/01/2022   GFRNONAA >60 10/01/2022   GFRAA 54 (L) 10/26/2019    Lab Results  Component Value Date   WBC 2.5 (L) 10/01/2022   NEUTROABS 1.5 (L) 10/01/2022   HGB 10.5 (L) 10/01/2022   HCT 31.0 (L) 10/01/2022   MCV 95.1 10/01/2022   PLT 84 (L) 10/01/2022     STUDIES: No results found.   ASSESSMENT: AML.  PLAN:    AML: Confirmed by bone marrow biopsy.  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  Patient's most recent bone marrow biopsy on March 23, 2022 revealed an increase of her blast count from 4% previously to up to 15% currently.  She last received treatment with cycle 22 of Vidaza on February 13, 2022.  Given her progressive disease and  IDH-1 mutation, Vidaza was discontinued and patient initially received 150 mg  twice daily of Olutasidenib (Rezlidhia).  This has been now dose reduce to once per day secondary to nausea and thrombocytopenia.  Return to clinic in 4 weeks for laboratory work and evaluation by clinical pharmacy and then in 8 weeks for laboratory work, further evaluation, and continuation of treatment.  Consider repeat bone marrow biopsy in the near future.  Neutropenia: Mild.  Patient's ANC is 1.5 today.  Treatment as above. Thrombocytopenia: Chronic and unchanged.  Patient platelet count is 84 today.  Continue dose reduced treatment once per day as above. Diarrhea: Patient does not complain of this today.  Continue Lomotil as needed.   Anemia: Mildly improved.  Patient's hemoglobin is 10.5 today. Nausea: Improved with dose reduction.  Continue ondansetron and Compazine as needed.   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  Patient also received chemotherapy and likely received Adriamycin, but exact regimen is unknown.  She completed 5 years of hormonal therapy in approximately June 2015. Currently, she has no evidence of disease.  Her most recent mammogram on July 29, 2021 was reported as BI-RADS 2.  Repeat in March 2024. History of colon cancer: Patient also states that she received chemotherapy for this, possibly FOLFOX but again this is unknown. Hair thinning: Monitor.  Patient expressed understanding and was in agreement with this plan. She also understands that She can call clinic at any time with any questions, concerns, or complaints.    Cancer Staging  History of breast cancer Staging form: Breast, AJCC 7th Edition - Clinical stage from 01/07/2016: Stage IA (T1c, N0, M0) - Signed by Jeralyn Ruths, MD on 01/07/2016 Laterality: Right Estrogen receptor status: Positive Progesterone receptor status:  Positive HER2 status: Negative   Jeralyn Ruths, MD   10/02/2022 12:59 PM

## 2022-10-08 ENCOUNTER — Other Ambulatory Visit (INDEPENDENT_AMBULATORY_CARE_PROVIDER_SITE_OTHER): Payer: PPO

## 2022-10-08 DIAGNOSIS — E039 Hypothyroidism, unspecified: Secondary | ICD-10-CM

## 2022-10-08 LAB — TSH: TSH: 0.02 u[IU]/mL — ABNORMAL LOW (ref 0.35–5.50)

## 2022-10-12 ENCOUNTER — Other Ambulatory Visit
Admission: RE | Admit: 2022-10-12 | Discharge: 2022-10-12 | Disposition: A | Discharge status: Home / Self Care | Payer: PPO | Source: Ambulatory Visit | Attending: Internal Medicine | Admitting: Internal Medicine

## 2022-10-12 DIAGNOSIS — M542 Cervicalgia: Secondary | ICD-10-CM | POA: Diagnosis not present

## 2022-10-12 DIAGNOSIS — J209 Acute bronchitis, unspecified: Secondary | ICD-10-CM | POA: Diagnosis not present

## 2022-10-12 DIAGNOSIS — R079 Chest pain, unspecified: Secondary | ICD-10-CM | POA: Diagnosis not present

## 2022-10-12 DIAGNOSIS — B9689 Other specified bacterial agents as the cause of diseases classified elsewhere: Secondary | ICD-10-CM | POA: Diagnosis not present

## 2022-10-12 DIAGNOSIS — J019 Acute sinusitis, unspecified: Secondary | ICD-10-CM | POA: Diagnosis not present

## 2022-10-12 DIAGNOSIS — R0602 Shortness of breath: Secondary | ICD-10-CM | POA: Insufficient documentation

## 2022-10-12 DIAGNOSIS — R072 Precordial pain: Secondary | ICD-10-CM | POA: Insufficient documentation

## 2022-10-12 DIAGNOSIS — R000 Tachycardia, unspecified: Secondary | ICD-10-CM | POA: Diagnosis not present

## 2022-10-12 DIAGNOSIS — Z03818 Encounter for observation for suspected exposure to other biological agents ruled out: Secondary | ICD-10-CM | POA: Diagnosis not present

## 2022-10-12 LAB — D-DIMER, QUANTITATIVE: D-Dimer, Quant: 0.75 ug/mL-FEU — ABNORMAL HIGH (ref 0.00–0.50)

## 2022-10-13 ENCOUNTER — Encounter: Payer: Self-pay | Admitting: Internal Medicine

## 2022-10-13 ENCOUNTER — Other Ambulatory Visit (HOSPITAL_COMMUNITY): Payer: Self-pay

## 2022-10-15 ENCOUNTER — Telehealth: Payer: Self-pay

## 2022-10-15 NOTE — Telephone Encounter (Signed)
-----   Message from Dale Spavinaw, MD sent at 10/15/2022  5:20 AM EDT ----- Notify - TSH is suppressed.  Confirm on q day.  If so, then decrease to q day.  Recheck tsh in 6 weeks.

## 2022-10-16 ENCOUNTER — Other Ambulatory Visit (HOSPITAL_COMMUNITY): Payer: Self-pay

## 2022-10-16 ENCOUNTER — Encounter (HOSPITAL_COMMUNITY): Payer: Self-pay

## 2022-10-19 ENCOUNTER — Encounter (HOSPITAL_COMMUNITY): Payer: Self-pay

## 2022-10-19 ENCOUNTER — Other Ambulatory Visit (HOSPITAL_COMMUNITY): Payer: Self-pay

## 2022-10-21 ENCOUNTER — Other Ambulatory Visit: Payer: Self-pay

## 2022-10-21 ENCOUNTER — Other Ambulatory Visit (HOSPITAL_COMMUNITY): Payer: Self-pay

## 2022-10-21 MED ORDER — LEVOTHYROXINE SODIUM 100 MCG PO TABS: 100.0000 ug | ORAL_TABLET | Freq: Every day | ORAL | 0 refills | Status: DC

## 2022-10-22 ENCOUNTER — Other Ambulatory Visit: Payer: Self-pay

## 2022-10-27 NOTE — Progress Notes (Signed)
Oral Chemotherapy Clinic [facility]  [phone] [phone]  Patient Care Team: Dale Woodland, MD as PCP - General (Internal Medicine) Iran Ouch, MD as PCP - Cardiology (Cardiology) Jeralyn Ruths, MD as Consulting Physician (Oncology)   Name of the patient: Debra Hale  [id number]  08-26-1950   Date of visit: 10/27/22  HPI: Patient is a 72 y.o. female with AML. Previously treated with azacitadine and venetoclax. A repeat BM biopsy showed disease progression. 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 She was dose reduced to olutasidenib 100mg  daily on 06/22/22 due to decreased pltc.   Reason for Consult: Oral chemotherapy follow-up for Rezlidhia (olutasidenib) therapy.   PAST MEDICAL HISTORY: Past Medical History:  Diagnosis Date   Abdominal pain 03/06/2016   Acute pericarditis 05/31/2013   Anxiety    Arthritis    Osteoarthritis   Breast cancer (HCC) 2009   right breast lumpectomy with rad tx   Colon cancer (HCC)    surgery with chemo and rad tx   Complication of anesthesia    GERD (gastroesophageal reflux disease)    Heart palpitations    History of hiatal hernia    History of kidney stones    History of thyroid cancer 09/18/2008   Qualifier: Diagnosis of   By: Denyse Amass CMA, Carol       Hypothyroidism    Liver disease    Liver nodule    s/p negative biopsy   Malignant neoplasm of thyroid gland (HCC) 2002   s/p surgery and XRT   Osteoporosis    Other and unspecified hyperlipidemia    Palpitations    Personal history of chemotherapy    Personal history of malignant neoplasm of large intestine    carcinoma - cecum, s/p right laparoscopic colectomy - s/p chemotherapy and XRT   Personal history of radiation therapy    Pneumonia 2019   PONV (postoperative nausea and vomiting)    Pure hypercholesterolemia    Unspecified hereditary and idiopathic peripheral  neuropathy     HEMATOLOGY/ONCOLOGY HISTORY:  Oncology History  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 Initial Diagnosis   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HCC)   06/10/2020 - 12/19/2021 Chemotherapy   Patient is on Treatment Plan : AML dose reduced azacitidine SQ D1-5 q28d     06/10/2020 - 02/13/2022 Chemotherapy   Patient is on Treatment Plan : AML Azacitidine SQ D1-5 q28d       ALLERGIES:  is allergic to demeclocycline, sulfa antibiotics, tetracyclines & related, levofloxacin, augmentin [amoxicillin-pot clavulanate], bentyl [dicyclomine hcl], ciprofloxacin, codeine, dicyclomine hcl, epinephrine, flagyl [metronidazole], librax [chlordiazepoxide-clidinium], novocain [procaine], phenobarbital, prednisone, and ultram [tramadol].  MEDICATIONS:  Current Outpatient Medications  Medication Sig Dispense Refill   Azelastine-Fluticasone 137-50 MCG/ACT SUSP SPRAY 2 SPRAYS INTO EACH NOSTRIL EVERY DAY 23 g 2   clonazePAM (KLONOPIN) 0.5 MG tablet Take 1 tablet (0.5 mg total) by mouth 3 (three) times daily as needed for anxiety. 30 tablet 0   escitalopram (LEXAPRO) 20 MG tablet Take 20 mg by mouth every morning.      levothyroxine (SYNTHROID) 100 MCG tablet Take 1 tablet (100 mcg total) by mouth daily. 90 tablet 0   loperamide (IMODIUM) 2 MG capsule Take by mouth.     metoprolol tartrate (LOPRESSOR) 25 MG tablet TAKE 1 TABLET (25 MG TOTAL) BY MOUTH AS NEEDED (AS NEEDED UP TO TWICE DAILY FOR PALPITATIONS). (Patient not taking: Reported on 09/18/2022) 60 tablet 1   olutasidenib (  REZLIDHIA) 150 MG capsule Take 1 capsule (150 mg total) by mouth daily. Take on an empty stomach at least 1 hour before or 2 hours after a meal. 30 capsule 2   ondansetron (ZOFRAN) 4 MG tablet Take 1 tablet (4 mg total) by mouth 2 (two) times daily. Take prior to Rezlidhia (oral chemotherapy) dose. (Patient not taking: Reported on 10/01/2022) 60 tablet 2   pantoprazole (PROTONIX) 40 MG tablet TAKE ONE TABLET (40 MG) BY  MOUTH EVERY DAY 90 tablet 2   potassium chloride SA (KLOR-CON M) 20 MEQ tablet Take 1 tablet (20 mEq total) by mouth daily. 30 tablet 2   prochlorperazine (COMPAZINE) 10 MG tablet Take 1 tablet (10 mg total) by mouth every 6 (six) hours as needed for nausea or vomiting. (Patient not taking: Reported on 09/18/2022) 30 tablet 3   valACYclovir (VALTREX) 500 MG tablet TAKE ONE TABLET BY MOUTH DAILY 30 tablet 2   No current facility-administered medications for this visit.    VITAL SIGNS: There were no vitals taken for this visit. There were no vitals filed for this visit.   Estimated body mass index is 20.55 kg/m as calculated from the following:   Height as of 08/10/22: 5\' 4"  (1.626 m).   Weight as of 10/01/22: 54.3 kg (119 lb 11.2 oz).  LABS: CBC:    Component Value Date/Time   WBC 2.5 (L) 10/01/2022 1327   HGB 10.5 (L) 10/01/2022 1327   HGB 12.5 05/09/2018 1147   HCT 31.0 (L) 10/01/2022 1327   HCT 36.8 05/09/2018 1147   PLT 84 (L) 10/01/2022 1327   PLT 285 05/09/2018 1147   MCV 95.1 10/01/2022 1327   MCV 86 05/09/2018 1147   MCV 89 11/30/2013 1419   NEUTROABS 1.5 (L) 10/01/2022 1327   NEUTROABS 2.9 11/20/2013 1404   LYMPHSABS 0.6 (L) 10/01/2022 1327   LYMPHSABS 0.9 (L) 11/20/2013 1404   MONOABS 0.3 10/01/2022 1327   MONOABS 0.2 11/20/2013 1404   EOSABS 0.1 10/01/2022 1327   EOSABS 0.1 11/20/2013 1404   BASOSABS 0.0 10/01/2022 1327   BASOSABS 0.0 11/20/2013 1404   Comprehensive Metabolic Panel:    Component Value Date/Time   NA 139 10/01/2022 1327   NA 143 05/09/2018 1147   NA 145 11/30/2013 1419   K 3.8 10/01/2022 1327   K 3.5 11/30/2013 1419   CL 105 10/01/2022 1327   CL 109 (H) 11/30/2013 1419   CO2 27 10/01/2022 1327   CO2 29 11/30/2013 1419   BUN 14 10/01/2022 1327   BUN 11 05/09/2018 1147   BUN 15 11/30/2013 1419   CREATININE 0.91 10/01/2022 1327   CREATININE 1.04 11/30/2013 1419   GLUCOSE 96 10/01/2022 1327   GLUCOSE 97 11/30/2013 1419   CALCIUM 8.6 (L)  10/01/2022 1327   CALCIUM 8.7 11/30/2013 1419   AST 17 10/01/2022 1327   AST 28 11/30/2013 1419   ALT 35 10/01/2022 1327   ALT 71 (H) 11/30/2013 1419   ALKPHOS 77 10/01/2022 1327   ALKPHOS 114 11/30/2013 1419   BILITOT 0.7 10/01/2022 1327   BILITOT 0.7 11/30/2013 1419   PROT 6.3 (L) 10/01/2022 1327   PROT 7.1 11/30/2013 1419   ALBUMIN 3.8 10/01/2022 1327   ALBUMIN 3.3 (L) 11/30/2013 1419     Present during today's visit: patient only  Assessment and Plan: CMP/CBC reviewed, continue reduced dose olutasidenib 150mg  daily  Pltc and ANC trending down, will continue to monitor   Oral Chemotherapy Side Effect/Intolerance:  Nausea: manageable, reports  taking anti-emetic daily Diarrhea: present but improved, back to her baseline amount of diarrhea Taste changes and fatigue: improved/resolved   Oral Chemotherapy Adherence: no missed doses reported There are no patient barriers to medication adherence identified.   New medications: None reported   Medication Access Issues: No issues, patient fills at [facility].   Patient expressed understanding and was in agreement with this plan. She also understands that She can call clinic at any time with any questions, concerns, or complaints.   Follow-up plan: RTC in 2 week for labs and 4 weeks lab/MD.   Thank you for allowing me to participate in the care of this very pleasant patient.   Time Total: 15 minutes  Visit consisted of counseling and education on dealing with issues of symptom management in the setting of serious and potentially life-threatening illness.Greater than 50%  of this time was spent counseling and coordinating care related to the above assessment and plan.  Signed by: Remi Haggard, PharmD, BCPS, Nolon Bussing, CPP Hematology/Oncology Clinical Pharmacist Practitioner  [place] Oral Chemotherapy Navigation [facility] [phone]  10/27/2022 4:06 PM

## 2022-10-28 ENCOUNTER — Inpatient Hospital Stay: Payer: PPO | Admitting: Pharmacist

## 2022-10-28 ENCOUNTER — Encounter: Payer: Self-pay | Admitting: Pharmacist

## 2022-10-28 ENCOUNTER — Inpatient Hospital Stay: Payer: PPO | Attending: Oncology

## 2022-10-28 VITALS — BP 131/72 | HR 78 | Temp 97.3°F | Resp 18 | Ht 64.0 in | Wt 119.0 lb

## 2022-10-28 DIAGNOSIS — C9200 Acute myeloblastic leukemia, not having achieved remission: Secondary | ICD-10-CM | POA: Insufficient documentation

## 2022-10-28 DIAGNOSIS — Z853 Personal history of malignant neoplasm of breast: Secondary | ICD-10-CM | POA: Insufficient documentation

## 2022-10-28 DIAGNOSIS — Z85038 Personal history of other malignant neoplasm of large intestine: Secondary | ICD-10-CM | POA: Diagnosis not present

## 2022-10-28 LAB — COMPREHENSIVE METABOLIC PANEL
ALT: 31 U/L (ref 0–44)
AST: 16 U/L (ref 15–41)
Albumin: 3.9 g/dL (ref 3.5–5.0)
Alkaline Phosphatase: 70 U/L (ref 38–126)
Anion gap: 8 (ref 5–15)
BUN: 18 mg/dL (ref 8–23)
CO2: 27 mmol/L (ref 22–32)
Calcium: 8.9 mg/dL (ref 8.9–10.3)
Chloride: 104 mmol/L (ref 98–111)
Creatinine, Ser: 1.04 mg/dL — ABNORMAL HIGH (ref 0.44–1.00)
GFR, Estimated: 57 mL/min — ABNORMAL LOW (ref >60)
Glucose, Bld: 85 mg/dL (ref 70–99)
Potassium: 3.5 mmol/L (ref 3.5–5.1)
Sodium: 139 mmol/L (ref 135–145)
Total Bilirubin: 0.4 mg/dL (ref 0.3–1.2)
Total Protein: 6.3 g/dL — ABNORMAL LOW (ref 6.5–8.1)

## 2022-10-28 LAB — CBC WITH DIFFERENTIAL/PLATELET
Abs Immature Granulocytes: 0.01 10*3/uL (ref 0.00–0.07)
Basophils Absolute: 0 10*3/uL (ref 0.0–0.1)
Basophils Relative: 0 %
Eosinophils Absolute: 0.1 10*3/uL (ref 0.0–0.5)
Eosinophils Relative: 3 %
HCT: 37 % (ref 36.0–46.0)
Hemoglobin: 12.8 g/dL (ref 12.0–15.0)
Immature Granulocytes: 0 %
Lymphocytes Relative: 17 %
Lymphs Abs: 0.7 10*3/uL (ref 0.7–4.0)
MCH: 32.5 pg (ref 26.0–34.0)
MCHC: 34.6 g/dL (ref 30.0–36.0)
MCV: 93.9 fL (ref 80.0–100.0)
Monocytes Absolute: 0.4 10*3/uL (ref 0.1–1.0)
Monocytes Relative: 8 %
Neutro Abs: 3.2 10*3/uL (ref 1.7–7.7)
Neutrophils Relative %: 72 %
Platelets: 146 10*3/uL — ABNORMAL LOW (ref 150–400)
RBC: 3.94 MIL/uL (ref 3.87–5.11)
RDW: 15.3 % (ref 11.5–15.5)
WBC: 4.5 10*3/uL (ref 4.0–10.5)
nRBC: 0 % (ref 0.0–0.2)

## 2022-10-29 ENCOUNTER — Other Ambulatory Visit: Payer: PPO

## 2022-10-29 ENCOUNTER — Ambulatory Visit: Payer: PPO | Admitting: Pharmacist

## 2022-11-02 ENCOUNTER — Other Ambulatory Visit: Payer: Self-pay | Admitting: Pharmacist

## 2022-11-02 DIAGNOSIS — C9200 Acute myeloblastic leukemia, not having achieved remission: Secondary | ICD-10-CM

## 2022-11-03 ENCOUNTER — Telehealth: Payer: Self-pay | Admitting: Internal Medicine

## 2022-11-03 DIAGNOSIS — R251 Tremor, unspecified: Secondary | ICD-10-CM | POA: Diagnosis not present

## 2022-11-03 DIAGNOSIS — E039 Hypothyroidism, unspecified: Secondary | ICD-10-CM

## 2022-11-03 DIAGNOSIS — R0600 Dyspnea, unspecified: Secondary | ICD-10-CM | POA: Diagnosis not present

## 2022-11-03 NOTE — Telephone Encounter (Signed)
Patient called to state she spoke with Access Nurse and they recommended she be seen today, right away.  Patient needs to be seen in person and we have two virtual appointments left at the end of the day.  Patient states she cannot do a MyChart appointment.  I offered appoinments at our other [place] locations, but patient states she cannot drive to [place].  I transferred patient to Rita Ohara, LPN.

## 2022-11-03 NOTE — Telephone Encounter (Signed)
Please call and f/u with pt.  Her TSH was suppressed.  We adjusted her synthroid.  She does need a f/u tsh checked to confirm improved.  Also can schedule an appt with me for evaluation.  In reviewing, it appears she was seen at acute care.  Please call and confirm doing ok.

## 2022-11-03 NOTE — Telephone Encounter (Signed)
FYI-  Spoke with patient because she was told by access nurse to go to ED for panic attacks/increased anxiety. She did not want to go to ED because she has leukemia. She says she feels like her synthroid is causing her to have a nervous break down. Patient requested to do a virtual visit with someone at our office. Confirmed no SOB. She did mention feeling like her heart was racing some but has had this in the past. She has been on synthroid for 10+ years with no issues. Explained to patient that given her symptoms, she needs to be evaluated in person at an acute care to confirm nothing acute is going on then could f/u after. Pt gave verbal understanding.

## 2022-11-03 NOTE — Telephone Encounter (Signed)
Pt called in stating that the new dose of levothyroxine (SYNTHROID) 100 MCG tablet its giving her  nervous breakdown. She stated that yesterday she was shaking, and she has consulted with her others providers and she was told that the med change might be the cause. She will like to speak to a nurse concerning this issue?  Call to transferred to Access Nurse.

## 2022-11-04 NOTE — Telephone Encounter (Signed)
Pt returnted [place] LPN call. Unable to transfer. Note below was read to pt. Pt will like a call back from [place] for clarification.

## 2022-11-04 NOTE — Telephone Encounter (Signed)
Called patient. Mail box full. 

## 2022-11-06 NOTE — Telephone Encounter (Signed)
Pt called back. I spoke to [place] LPN. Pt its booked for lab on 7/9 and with Dr. Lorin Picket on 7/12.

## 2022-11-06 NOTE — Telephone Encounter (Signed)
TSH has been ordered

## 2022-11-10 ENCOUNTER — Other Ambulatory Visit: Payer: PPO

## 2022-11-13 ENCOUNTER — Encounter: Payer: Self-pay | Admitting: Internal Medicine

## 2022-11-13 ENCOUNTER — Ambulatory Visit (INDEPENDENT_AMBULATORY_CARE_PROVIDER_SITE_OTHER): Payer: PPO | Admitting: Internal Medicine

## 2022-11-13 ENCOUNTER — Telehealth: Payer: Self-pay

## 2022-11-13 VITALS — BP 118/70 | HR 78 | Temp 97.9°F | Resp 16 | Ht 64.0 in | Wt 123.2 lb

## 2022-11-13 DIAGNOSIS — E876 Hypokalemia: Secondary | ICD-10-CM | POA: Diagnosis not present

## 2022-11-13 DIAGNOSIS — E039 Hypothyroidism, unspecified: Secondary | ICD-10-CM | POA: Diagnosis not present

## 2022-11-13 DIAGNOSIS — C9200 Acute myeloblastic leukemia, not having achieved remission: Secondary | ICD-10-CM

## 2022-11-13 DIAGNOSIS — K219 Gastro-esophageal reflux disease without esophagitis: Secondary | ICD-10-CM

## 2022-11-13 DIAGNOSIS — I5032 Chronic diastolic (congestive) heart failure: Secondary | ICD-10-CM | POA: Diagnosis not present

## 2022-11-13 DIAGNOSIS — F439 Reaction to severe stress, unspecified: Secondary | ICD-10-CM

## 2022-11-13 DIAGNOSIS — F320 Major depressive disorder, single episode, mild: Secondary | ICD-10-CM

## 2022-11-13 DIAGNOSIS — F419 Anxiety disorder, unspecified: Secondary | ICD-10-CM

## 2022-11-13 DIAGNOSIS — E7800 Pure hypercholesterolemia, unspecified: Secondary | ICD-10-CM

## 2022-11-13 DIAGNOSIS — Z85850 Personal history of malignant neoplasm of thyroid: Secondary | ICD-10-CM

## 2022-11-13 DIAGNOSIS — G620 Drug-induced polyneuropathy: Secondary | ICD-10-CM | POA: Diagnosis not present

## 2022-11-13 DIAGNOSIS — T451X5A Adverse effect of antineoplastic and immunosuppressive drugs, initial encounter: Secondary | ICD-10-CM | POA: Diagnosis not present

## 2022-11-13 DIAGNOSIS — R195 Other fecal abnormalities: Secondary | ICD-10-CM | POA: Diagnosis not present

## 2022-11-13 LAB — BASIC METABOLIC PANEL
BUN: 18 mg/dL (ref 6–23)
CO2: 34 mEq/L — ABNORMAL HIGH (ref 19–32)
Calcium: 9.3 mg/dL (ref 8.4–10.5)
Chloride: 102 mEq/L (ref 96–112)
Creatinine, Ser: 1.23 mg/dL — ABNORMAL HIGH (ref 0.40–1.20)
GFR: 43.75 mL/min — ABNORMAL LOW (ref >60.00)
Glucose, Bld: 83 mg/dL (ref 70–99)
Potassium: 3.5 mEq/L (ref 3.5–5.1)
Sodium: 144 mEq/L (ref 135–145)

## 2022-11-13 LAB — TSH: TSH: 3.44 u[IU]/mL (ref 0.35–5.50)

## 2022-11-13 NOTE — Telephone Encounter (Signed)
-----   Message from [place] sent at 11/13/2022  3:10 PM EDT ----- Notify - potassium is ok.  Kidney function has decreased some from recent check.  Confirm no taking antiinflammatory medication.  Stay hydrated.  We will follow.  Also TSH is wnl.  Confirm she was on synthroid q day. She stopped over the last few days.  Given lab was wnl, I would like to have her restart the q day.  Recheck tsh in 4 weeks and recheck met b (dx decreased gfr) in 4 weeks.

## 2022-11-13 NOTE — Progress Notes (Signed)
Subjective:    Patient ID: Debra Hale, female    DOB: 06/09/1950, 72 y.o.   MRN: [id number]  Patient here for  Chief Complaint  Patient presents with   Medical Management of Chronic Issues    HPI Here for a follow up appt.  Was evaluated acute care - episode of feeling nervous and shaky. 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  Dose reduced due to nausea and thrombocytopenia. Sees psychiatry.  On lexapro.  Has clonazepam.  Takes scheduled bid.  Has f/u with Dr Lenis Noon next week.  Feeling better, but still feels nervous.  Discussed.  Eating better.  No nausea or vomiting.  Not having the loose stool like previous. States will sometimes go 1 1/2 days without bm.   Overdue colonoscopy and mammogram.   Past Medical History:  Diagnosis Date   Abdominal pain 03/06/2016   Acute pericarditis 05/31/2013   Anxiety    Arthritis    Osteoarthritis   Breast cancer (HCC) 2009   right breast lumpectomy with rad tx   Colon cancer (HCC)    surgery with chemo and rad tx   Complication of anesthesia    GERD (gastroesophageal reflux disease)    Heart palpitations    History of hiatal hernia    History of kidney stones    History of thyroid cancer 09/18/2008   Qualifier: Diagnosis of   By: Denyse Amass, CMA, Carol       Hypothyroidism    Liver disease    Liver nodule    s/p negative biopsy   Malignant neoplasm of thyroid gland (HCC) 2002   s/p surgery and XRT   Osteoporosis    Other and unspecified hyperlipidemia    Palpitations    Personal history of chemotherapy    Personal history of malignant neoplasm of large intestine    carcinoma - cecum, s/p right laparoscopic colectomy - s/p chemotherapy and XRT   Personal history of radiation therapy    Pneumonia 2019   PONV (postoperative nausea and vomiting)    Pure hypercholesterolemia    Unspecified hereditary and idiopathic peripheral neuropathy    Past Surgical History:  Procedure Laterality Date   APPENDECTOMY  1985    BREAST BIOPSY Right 2009   positive   BREAST BIOPSY Right 2009   negative   BREAST LUMPECTOMY Right 2009   breast cancer   CHOLECYSTECTOMY  1995   COLONOSCOPY     COLONOSCOPY WITH PROPOFOL N/A 10/29/2017   Procedure: COLONOSCOPY WITH PROPOFOL;  Surgeon: Scot Jun, MD;  Location: [facility] ENDOSCOPY;  Service: Endoscopy;  Laterality: N/A;   DILATION AND CURETTAGE OF UTERUS  1990   DILATION AND CURETTAGE, DIAGNOSTIC / THERAPEUTIC  1990   ESOPHAGOGASTRODUODENOSCOPY     ESOPHAGOGASTRODUODENOSCOPY (EGD) WITH PROPOFOL N/A 10/29/2017   Procedure: ESOPHAGOGASTRODUODENOSCOPY (EGD) WITH PROPOFOL;  Surgeon: Scot Jun, MD;  Location: [facility] ENDOSCOPY;  Service: Endoscopy;  Laterality: N/A;   INCISION AND DRAINAGE PERIRECTAL ABSCESS N/A 06/17/2020   Procedure: IRRIGATION AND DEBRIDEMENT PERIRECTAL ABSCESS;  Surgeon: Leafy Ro, MD;  Location: [facility] ORS;  Service: General;  Laterality: N/A;   IR BONE MARROW BIOPSY & ASPIRATION  07/09/2021   LAPAROSCOPIC PARTIAL COLECTOMY     stage 3-C carcinoma of the cecum, s/p chemotherapy and xrt   LITHOTRIPSY     SIGMOIDOSCOPY  08/26/1993   THYROID LOBECTOMY  2002   s/p XRT   TOTAL HIP ARTHROPLASTY Right 10/24/2019   Procedure: TOTAL HIP ARTHROPLASTY;  Surgeon: Christena Flake, MD;  Location: [facility] ORS;  Service: Orthopedics;  Laterality: Right;   Family History  Problem Relation Age of Onset   Stroke Mother        52s   Alzheimer's disease Mother    Lung cancer Father    Prostate cancer Father    Cancer Father        Colon   Colon cancer Father    Breast cancer Sister        72's   Lung cancer Sister    Breast cancer Maternal Aunt    Social History   Socioeconomic History   Marital status: Single    Spouse name: Not on file   Number of children: 0   Years of education: Not on file   Highest education level: Not on file  Occupational History   Occupation: Retired  Tobacco Use   Smoking status: Never   Smokeless tobacco: Never  Vaping  Use   Vaping status: Never Used  Substance and Sexual Activity   Alcohol use: No    Alcohol/week: 0.0 standard drinks of alcohol   Drug use: No   Sexual activity: Not Currently  Other Topics Concern   Not on file  Social History Narrative   Not on file   Social Determinants of Health   Financial Resource Strain: Low Risk  (09/18/2022)   Overall Financial Resource Strain (CARDIA)    Difficulty of Paying Living Expenses: Not hard at all  Food Insecurity: No Food Insecurity (09/18/2022)   Hunger Vital Sign    Worried About Running Out of Food in the Last Year: Never true    Ran Out of Food in the Last Year: Never true  Transportation Needs: No Transportation Needs (09/18/2022)   PRAPARE - Administrator, Civil Service (Medical): No    Lack of Transportation (Non-Medical): No  Physical Activity: Insufficiently Active (09/18/2022)   Exercise Vital Sign    Days of Exercise per Week: 1 day    Minutes of Exercise per Session: 10 min  Stress: Stress Concern Present (09/18/2022)   [employer] of Occupational Health - Occupational Stress Questionnaire    Feeling of Stress : To some extent  Social Connections: Moderately Isolated (09/18/2022)   Social Connection and Isolation Panel [NHANES]    Frequency of Communication with Friends and Family: More than three times a week    Frequency of Social Gatherings with Friends and Family: Twice a week    Attends Religious Services: 1 to 4 times per year    Active Member of [employer] or Organizations: No    Attends Banker Meetings: Never    Marital Status: Divorced     Review of Systems  Constitutional:  Negative for appetite change.       Eating better.   HENT:  Negative for congestion and sinus pressure.   Respiratory:  Negative for cough, chest tightness and shortness of breath.   Cardiovascular:  Negative for chest pain and palpitations.  Gastrointestinal:  Negative for abdominal pain, nausea and vomiting.   Genitourinary:  Negative for difficulty urinating and dysuria.  Musculoskeletal:  Negative for joint swelling and myalgias.  Skin:  Negative for color change and rash.  Neurological:  Negative for dizziness and headaches.  Psychiatric/Behavioral:  Negative for agitation and dysphoric mood.        Objective:     BP 118/70   Pulse 78   Temp 97.9 F (36.6 C)   Resp 16  Ht 5\' 4"  (1.626 m)   Wt 123 lb 3.2 oz (55.9 kg)   SpO2 97%   BMI 21.15 kg/m  Wt Readings from Last 3 Encounters:  11/13/22 123 lb 3.2 oz (55.9 kg)  10/28/22 119 lb (54 kg)  10/01/22 119 lb 11.2 oz (54.3 kg)    Physical Exam Vitals reviewed.  Constitutional:      General: She is not in acute distress.    Appearance: Normal appearance.  HENT:     Head: Normocephalic and atraumatic.     Right Ear: External ear normal.     Left Ear: External ear normal.  Eyes:     General: No scleral icterus.       Right eye: No discharge.        Left eye: No discharge.     Conjunctiva/sclera: Conjunctivae normal.  Neck:     Thyroid: No thyromegaly.  Cardiovascular:     Rate and Rhythm: Normal rate and regular rhythm.  Pulmonary:     Effort: No respiratory distress.     Breath sounds: Normal breath sounds. No wheezing.  Abdominal:     General: Bowel sounds are normal.     Palpations: Abdomen is soft.     Tenderness: There is no abdominal tenderness.  Musculoskeletal:        General: No swelling or tenderness.     Cervical back: Neck supple. No tenderness.  Lymphadenopathy:     Cervical: No cervical adenopathy.  Skin:    Findings: No erythema or rash.  Neurological:     Mental Status: She is alert.  Psychiatric:        Mood and Affect: Mood normal.        Behavior: Behavior normal.      Outpatient Encounter Medications as of 11/13/2022  Medication Sig   Azelastine-Fluticasone 137-50 MCG/ACT SUSP SPRAY 2 SPRAYS INTO EACH NOSTRIL EVERY DAY   clonazePAM (KLONOPIN) 0.5 MG tablet Take 1 tablet (0.5 mg total)  by mouth 3 (three) times daily as needed for anxiety.   escitalopram (LEXAPRO) 20 MG tablet Take 20 mg by mouth every morning.    levothyroxine (SYNTHROID) 100 MCG tablet Take 1 tablet (100 mcg total) by mouth daily.   loperamide (IMODIUM) 2 MG capsule Take by mouth.   metoprolol tartrate (LOPRESSOR) 25 MG tablet TAKE 1 TABLET (25 MG TOTAL) BY MOUTH AS NEEDED (AS NEEDED UP TO TWICE DAILY FOR PALPITATIONS). (Patient not taking: Reported on 09/18/2022)   olutasidenib (REZLIDHIA) 150 MG capsule Take 1 capsule (150 mg total) by mouth daily. Take on an empty stomach at least 1 hour before or 2 hours after a meal.   pantoprazole (PROTONIX) 40 MG tablet TAKE ONE TABLET (40 MG) BY MOUTH EVERY DAY   potassium chloride SA (KLOR-CON M) 20 MEQ tablet Take 1 tablet (20 mEq total) by mouth daily.   prochlorperazine (COMPAZINE) 10 MG tablet TAKE ONE TABLET BY MOUTH EVERY 6 HOURS AS NEEDED FOR NAUSEA / VOMITING   valACYclovir (VALTREX) 500 MG tablet TAKE ONE TABLET BY MOUTH DAILY   [DISCONTINUED] ondansetron (ZOFRAN) 4 MG tablet Take 1 tablet (4 mg total) by mouth 2 (two) times daily. Take prior to Rezlidhia (oral chemotherapy) dose. (Patient not taking: Reported on 10/01/2022)   No facility-administered encounter medications on file as of 11/13/2022.     Lab Results  Component Value Date   WBC 4.5 10/28/2022   HGB 12.8 10/28/2022   HCT 37.0 10/28/2022   PLT 146 (L) 10/28/2022  GLUCOSE 83 11/13/2022   CHOL 163 02/20/2020   TRIG 110.0 02/20/2020   HDL 40.70 02/20/2020   LDLDIRECT 126.0 07/11/2015   LDLCALC 100 (H) 02/20/2020   ALT 31 10/28/2022   AST 16 10/28/2022   NA 144 11/13/2022   K 3.5 11/13/2022   CL 102 11/13/2022   CREATININE 1.23 (H) 11/13/2022   BUN 18 11/13/2022   CO2 34 (H) 11/13/2022   TSH 3.44 11/13/2022   INR 1.3 (H) 04/04/2022   HGBA1C 5.3 12/10/2016    No results found.     Assessment & Plan:  Hypothyroidism, unspecified type Assessment & Plan: On thyroid replacement.   Follow tsh. Check today.   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  Dose reduced due to nausea and thrombocytopenia.  Stable.    Anxiety Assessment & Plan: Continue lexapro.  Is doing some better now.  Has clonazepam. Continue f/u with Dr Lenis Noon. Has f/u appt with Dr Lenis Noon next week.    Chemotherapy-induced neuropathy (HCC) Assessment & Plan: Stable.    Chronic diastolic CHF (congestive heart failure) (HCC) Assessment & Plan: 2D echo on 09/05/2020 showed EF 60-65% with grade 1 diastolic dysfunction. No evidence of volume overload on exam.  Breathing stable.    Gastroesophageal reflux disease, unspecified whether esophagitis present Assessment & Plan: No upper symptoms reported. On protonix.    History of thyroid cancer Assessment & Plan: Recheck tsh to confirm wnl. Need to confirm not contributing to increased symptoms.    Hypercholesterolemia Assessment & Plan: Follow lipid panel.    Loose stools Assessment & Plan: Loose stools - improved.  Follow.    Major depressive disorder, single episode, mild [facility]) Assessment & Plan: Seeing Dr Lenis Noon.  Currently on lexapro and clonazepam.  Denies SI.  Follow.  Continue f/u with psychiatry.    Stress Assessment & Plan: Increased stress with her current medical issues.  Seeing Dr Lenis Noon. On lexapro and clonazepam.   Follow.  Notify me if feels needs any further intervention.  Has f/u planned next week.       [place], MD

## 2022-11-16 NOTE — Telephone Encounter (Signed)
Pt returned [place] LPN call. Note below was read to her. Pt its not clear and sounded a little confused. Pt need more clarification. She would like to f/u with Trisha.

## 2022-11-16 NOTE — Telephone Encounter (Signed)
Called patient. Mail box full.

## 2022-11-16 NOTE — Telephone Encounter (Signed)
 Called patient mailbox full.

## 2022-11-17 ENCOUNTER — Other Ambulatory Visit (HOSPITAL_COMMUNITY): Payer: Self-pay

## 2022-11-17 ENCOUNTER — Encounter: Payer: Self-pay | Admitting: Internal Medicine

## 2022-11-17 NOTE — Assessment & Plan Note (Signed)
 No upper symptoms reported.  On protonix.   

## 2022-11-17 NOTE — Assessment & Plan Note (Signed)
2D echo on 09/05/2020 showed EF 60-65% with grade 1 diastolic dysfunction. No evidence of volume overload on exam.  Breathing stable.

## 2022-11-17 NOTE — Assessment & Plan Note (Signed)
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  Dose reduced due to nausea and thrombocytopenia.  Stable.

## 2022-11-17 NOTE — Assessment & Plan Note (Signed)
Continue lexapro.  Is doing some better now.  Has clonazepam. Continue f/u with Dr Lenis Noon. Has f/u appt with Dr Lenis Noon next week.

## 2022-11-17 NOTE — Assessment & Plan Note (Signed)
Loose stools - improved.  Follow.

## 2022-11-17 NOTE — Assessment & Plan Note (Signed)
Seeing Dr Levine.  Currently on lexapro and clonazepam.  Denies SI.  Follow.  Continue f/u with psychiatry.  

## 2022-11-17 NOTE — Assessment & Plan Note (Signed)
On thyroid replacement.  Follow tsh.  Check today.  

## 2022-11-17 NOTE — Assessment & Plan Note (Signed)
Stable

## 2022-11-17 NOTE — Assessment & Plan Note (Signed)
Increased stress with her current medical issues.  Seeing Dr Lenis Noon. On lexapro and clonazepam.   Follow.  Notify me if feels needs any further intervention.  Has f/u planned next week.

## 2022-11-17 NOTE — Assessment & Plan Note (Signed)
 Follow lipid panel.   

## 2022-11-17 NOTE — Assessment & Plan Note (Addendum)
Recheck tsh to confirm wnl. Need to confirm not contributing to increased symptoms.

## 2022-11-18 NOTE — Telephone Encounter (Signed)
Called patient to discuss. Phone goes straight to voicemail and Unable to leave message due to mailbox being full and. If patient calls back please let me know and transfer call so I can speak with her. Have tried to reach her several times.

## 2022-11-18 NOTE — Telephone Encounter (Signed)
Pt called back and said that she would like any nurse to read those labs results to her. Previous message.

## 2022-11-18 NOTE — Telephone Encounter (Signed)
Patient states she is returning a call from Rita [place]ara, LPN.  I was unable to reach [name] at the time of the call so I let patient know that I will leave her a message so she can call her back.

## 2022-11-19 NOTE — Telephone Encounter (Signed)
Returned patients call. Phone goes straight to voicemail and Unable to leave message due to mailbox being full. If patient calls back please let me know and transfer call so I can speak with her. Have tried to reach her several times.

## 2022-11-19 NOTE — Telephone Encounter (Signed)
Patient returned office phone call. CMA was unavailable at time of call. Please call her back.

## 2022-11-19 NOTE — Telephone Encounter (Signed)
Please see bottom of message and give her results to her if she calls back. I do not have another number to reach her on and her phone is not ringing when I call. Goes straight to voicemail and then voicemail box is full so cannot leave message.

## 2022-11-19 NOTE — Telephone Encounter (Signed)
Called and left message for significant other (on DPR) to have patient return my call.

## 2022-11-19 NOTE — Telephone Encounter (Signed)
Patient is aware of results. Has already restarted synthroid. Will recheck BMP and TSH at appt on 8/9

## 2022-11-20 ENCOUNTER — Other Ambulatory Visit (HOSPITAL_COMMUNITY): Payer: Self-pay

## 2022-11-24 ENCOUNTER — Other Ambulatory Visit (HOSPITAL_COMMUNITY): Payer: Self-pay

## 2022-11-26 ENCOUNTER — Telehealth: Payer: Self-pay | Admitting: Internal Medicine

## 2022-11-26 ENCOUNTER — Encounter: Payer: Self-pay | Admitting: Oncology

## 2022-11-26 ENCOUNTER — Inpatient Hospital Stay (HOSPITAL_BASED_OUTPATIENT_CLINIC_OR_DEPARTMENT_OTHER): Payer: PPO | Admitting: Oncology

## 2022-11-26 ENCOUNTER — Inpatient Hospital Stay: Payer: PPO | Attending: Oncology

## 2022-11-26 VITALS — BP 134/77 | HR 65 | Temp 96.5°F | Resp 18 | Wt 125.9 lb

## 2022-11-26 DIAGNOSIS — Z9221 Personal history of antineoplastic chemotherapy: Secondary | ICD-10-CM | POA: Insufficient documentation

## 2022-11-26 DIAGNOSIS — Z923 Personal history of irradiation: Secondary | ICD-10-CM | POA: Insufficient documentation

## 2022-11-26 DIAGNOSIS — C9200 Acute myeloblastic leukemia, not having achieved remission: Secondary | ICD-10-CM | POA: Insufficient documentation

## 2022-11-26 DIAGNOSIS — Z853 Personal history of malignant neoplasm of breast: Secondary | ICD-10-CM | POA: Insufficient documentation

## 2022-11-26 DIAGNOSIS — Z85850 Personal history of malignant neoplasm of thyroid: Secondary | ICD-10-CM | POA: Insufficient documentation

## 2022-11-26 DIAGNOSIS — Z79899 Other long term (current) drug therapy: Secondary | ICD-10-CM | POA: Insufficient documentation

## 2022-11-26 DIAGNOSIS — E039 Hypothyroidism, unspecified: Secondary | ICD-10-CM

## 2022-11-26 DIAGNOSIS — Z79624 Long term (current) use of inhibitors of nucleotide synthesis: Secondary | ICD-10-CM | POA: Diagnosis not present

## 2022-11-26 LAB — CBC WITH DIFFERENTIAL/PLATELET
Abs Immature Granulocytes: 0.01 10*3/uL (ref 0.00–0.07)
Basophils Absolute: 0 10*3/uL (ref 0.0–0.1)
Basophils Relative: 0 %
Eosinophils Absolute: 0.1 10*3/uL (ref 0.0–0.5)
Eosinophils Relative: 3 %
HCT: 39.6 % (ref 36.0–46.0)
Hemoglobin: 13.5 g/dL (ref 12.0–15.0)
Immature Granulocytes: 0 %
Lymphocytes Relative: 18 %
Lymphs Abs: 0.9 10*3/uL (ref 0.7–4.0)
MCH: 32.8 pg (ref 26.0–34.0)
MCHC: 34.1 g/dL (ref 30.0–36.0)
MCV: 96.1 fL (ref 80.0–100.0)
Monocytes Absolute: 0.3 10*3/uL (ref 0.1–1.0)
Monocytes Relative: 7 %
Neutro Abs: 3.7 10*3/uL (ref 1.7–7.7)
Neutrophils Relative %: 72 %
Platelets: 112 10*3/uL — ABNORMAL LOW (ref 150–400)
RBC: 4.12 MIL/uL (ref 3.87–5.11)
RDW: 13.9 % (ref 11.5–15.5)
WBC: 5.1 10*3/uL (ref 4.0–10.5)
nRBC: 0 % (ref 0.0–0.2)

## 2022-11-26 LAB — COMPREHENSIVE METABOLIC PANEL
ALT: 47 U/L — ABNORMAL HIGH (ref 0–44)
AST: 20 U/L (ref 15–41)
Albumin: 4.1 g/dL (ref 3.5–5.0)
Alkaline Phosphatase: 63 U/L (ref 38–126)
Anion gap: 8 (ref 5–15)
BUN: 15 mg/dL (ref 8–23)
CO2: 30 mmol/L (ref 22–32)
Calcium: 9 mg/dL (ref 8.9–10.3)
Chloride: 104 mmol/L (ref 98–111)
Creatinine, Ser: 1 mg/dL (ref 0.44–1.00)
GFR, Estimated: 59 mL/min — ABNORMAL LOW (ref >60)
Glucose, Bld: 107 mg/dL — ABNORMAL HIGH (ref 70–99)
Potassium: 3.4 mmol/L — ABNORMAL LOW (ref 3.5–5.1)
Sodium: 142 mmol/L (ref 135–145)
Total Bilirubin: 0.3 mg/dL (ref 0.3–1.2)
Total Protein: 6.6 g/dL (ref 6.5–8.1)

## 2022-11-26 NOTE — Progress Notes (Signed)
[facility]  [phone] Fax:([phone]  ID: Debra Hale OB: [birth year]  MR#: [id number]  IEP#:[id number]  Patient Care Team: Dale Granville, MD as PCP - General (Internal Medicine) Iran Ouch, MD as PCP - Cardiology (Cardiology) Jeralyn Ruths, MD as Consulting Physician (Oncology)  CHIEF COMPLAINT: AML.  INTERVAL HISTORY: Patient returns to clinic today for repeat laboratory work, further evaluation, and continuation of dose reduced Rezlidhia.  She has occasional frequency and burning with urination, but otherwise feels well.  She does not complain of hair thinning or weakness or fatigue today.  She denies any pain.  She has no neurologic complaints.  She has no chest pain, shortness of breath, cough, or hemoptysis.  She denies any nausea, vomiting, constipation, or diarrhea.  Patient offers no further specific complaints today.  REVIEW OF SYSTEMS:   Review of Systems  Constitutional: Negative.  Negative for fever, malaise/fatigue and weight loss.  HENT:  Negative for congestion and sinus pain.   Respiratory: Negative.  Negative for cough, hemoptysis and shortness of breath.   Cardiovascular: Negative.  Negative for chest pain and leg swelling.  Gastrointestinal: Negative.  Negative for abdominal pain, diarrhea and nausea.  Genitourinary:  Positive for dysuria, frequency and urgency.  Musculoskeletal: Negative.  Negative for back pain and falls.  Skin: Negative.  Negative for rash.  Neurological: Negative.  Negative for dizziness, focal weakness, weakness and headaches.  Psychiatric/Behavioral: Negative.  Negative for memory loss. The patient is not nervous/anxious.     As per HPI. Otherwise, a complete review of systems is negative.  PAST MEDICAL HISTORY: Past Medical History:  Diagnosis Date   Abdominal pain 03/06/2016   Acute pericarditis 05/31/2013   Anxiety    Arthritis    Osteoarthritis   Breast cancer (HCC) 2009    right breast lumpectomy with rad tx   Colon cancer (HCC)    surgery with chemo and rad tx   Complication of anesthesia    GERD (gastroesophageal reflux disease)    Heart palpitations    History of hiatal hernia    History of kidney stones    History of thyroid cancer 09/18/2008   Qualifier: Diagnosis of   By: Denyse Amass, CMA, Carol       Hypothyroidism    Liver disease    Liver nodule    s/p negative biopsy   Malignant neoplasm of thyroid gland (HCC) 2002   s/p surgery and XRT   Osteoporosis    Other and unspecified hyperlipidemia    Palpitations    Personal history of chemotherapy    Personal history of malignant neoplasm of large intestine    carcinoma - cecum, s/p right laparoscopic colectomy - s/p chemotherapy and XRT   Personal history of radiation therapy    Pneumonia 2019   PONV (postoperative nausea and vomiting)    Pure hypercholesterolemia    Unspecified hereditary and idiopathic peripheral neuropathy     PAST SURGICAL HISTORY: Past Surgical History:  Procedure Laterality Date   APPENDECTOMY  1985   BREAST BIOPSY Right 2009   positive   BREAST BIOPSY Right 2009   negative   BREAST LUMPECTOMY Right 2009   breast cancer   CHOLECYSTECTOMY  1995   COLONOSCOPY     COLONOSCOPY WITH PROPOFOL N/A 10/29/2017   Procedure: COLONOSCOPY WITH PROPOFOL;  Surgeon: Scot Jun, MD;  Location: [facility] ENDOSCOPY;  Service: Endoscopy;  Laterality: N/A;   DILATION AND CURETTAGE OF UTERUS  1990   DILATION AND CURETTAGE,  DIAGNOSTIC / THERAPEUTIC  1990   ESOPHAGOGASTRODUODENOSCOPY     ESOPHAGOGASTRODUODENOSCOPY (EGD) WITH PROPOFOL N/A 10/29/2017   Procedure: ESOPHAGOGASTRODUODENOSCOPY (EGD) WITH PROPOFOL;  Surgeon: Scot Jun, MD;  Location: [facility] ENDOSCOPY;  Service: Endoscopy;  Laterality: N/A;   INCISION AND DRAINAGE PERIRECTAL ABSCESS N/A 06/17/2020   Procedure: IRRIGATION AND DEBRIDEMENT PERIRECTAL ABSCESS;  Surgeon: Leafy Ro, MD;  Location: [facility] ORS;  Service: General;   Laterality: N/A;   IR BONE MARROW BIOPSY & ASPIRATION  07/09/2021   LAPAROSCOPIC PARTIAL COLECTOMY     stage 3-C carcinoma of the cecum, s/p chemotherapy and xrt   LITHOTRIPSY     SIGMOIDOSCOPY  08/26/1993   THYROID LOBECTOMY  2002   s/p XRT   TOTAL HIP ARTHROPLASTY Right 10/24/2019   Procedure: TOTAL HIP ARTHROPLASTY;  Surgeon: Christena Flake, MD;  Location: [facility] ORS;  Service: Orthopedics;  Laterality: Right;    FAMILY HISTORY: Family History  Problem Relation Age of Onset   Stroke Mother        46s   Alzheimer's disease Mother    Lung cancer Father    Prostate cancer Father    Cancer Father        Colon   Colon cancer Father    Breast cancer Sister        9's   Lung cancer Sister    Breast cancer Maternal Aunt     ADVANCED DIRECTIVES (Y/N):  N  HEALTH MAINTENANCE: Social History   Tobacco Use   Smoking status: Never   Smokeless tobacco: Never  Vaping Use   Vaping status: Never Used  Substance Use Topics   Alcohol use: No    Alcohol/week: 0.0 standard drinks of alcohol   Drug use: No     Colonoscopy:  PAP:  Bone density:  Lipid panel:  Allergies  Allergen Reactions   Demeclocycline Other (See Comments)    Throat swells   Sulfa Antibiotics Other (See Comments)    Other reaction(s): Other (See Comments) Throat swells Other reaction(s): Other (See Comments) Throat swells Throat swells Other reaction(s): Other (See Comments) Throat swells Other reaction(s): Unknown Other reaction(s): Other (See Comments) Throat swells Other reaction(s): Other (See Comments) Other reaction(s): Other (See Comments) Throat swellsOther reaction(s): Other (See Comments) Throat swells   Tetracyclines & Related Other (See Comments)    Throat swells    Levofloxacin Diarrhea    Severe diarrhea   Augmentin [Amoxicillin-Pot Clavulanate] Diarrhea   Bentyl [Dicyclomine Hcl]     unkn   Ciprofloxacin Diarrhea   Codeine Other (See Comments)    dizziness     Dicyclomine  Hcl Other (See Comments)    unkn   Epinephrine     Speeds heart up - panic    Flagyl [Metronidazole] Nausea And Vomiting   Librax [Chlordiazepoxide-Clidinium]     unkn   Novocain [Procaine] Other (See Comments)    "Shaky"   Phenobarbital     unkn   Prednisone     Nervous     Ultram [Tramadol] Other (See Comments)    Sick feeling    Current Outpatient Medications  Medication Sig Dispense Refill   Azelastine-Fluticasone 137-50 MCG/ACT SUSP SPRAY 2 SPRAYS INTO EACH NOSTRIL EVERY DAY 23 g 2   clonazePAM (KLONOPIN) 0.5 MG tablet Take 1 tablet (0.5 mg total) by mouth 3 (three) times daily as needed for anxiety. 30 tablet 0   escitalopram (LEXAPRO) 20 MG tablet Take 20 mg by mouth every morning.      levothyroxine (  SYNTHROID) 100 MCG tablet Take 1 tablet (100 mcg total) by mouth daily. 90 tablet 0   olutasidenib (REZLIDHIA) 150 MG capsule Take 1 capsule (150 mg total) by mouth daily. Take on an empty stomach at least 1 hour before or 2 hours after a meal. 30 capsule 2   pantoprazole (PROTONIX) 40 MG tablet TAKE ONE TABLET (40 MG) BY MOUTH EVERY DAY 90 tablet 2   prochlorperazine (COMPAZINE) 10 MG tablet TAKE ONE TABLET BY MOUTH EVERY 6 HOURS AS NEEDED FOR NAUSEA / VOMITING 30 tablet 3   valACYclovir (VALTREX) 500 MG tablet TAKE ONE TABLET BY MOUTH DAILY 30 tablet 2   loperamide (IMODIUM) 2 MG capsule Take by mouth. (Patient not taking: Reported on 11/26/2022)     metoprolol tartrate (LOPRESSOR) 25 MG tablet TAKE 1 TABLET (25 MG TOTAL) BY MOUTH AS NEEDED (AS NEEDED UP TO TWICE DAILY FOR PALPITATIONS). (Patient not taking: Reported on 09/18/2022) 60 tablet 1   potassium chloride SA (KLOR-CON M) 20 MEQ tablet Take 1 tablet (20 mEq total) by mouth daily. (Patient not taking: Reported on 11/26/2022) 30 tablet 2   No current facility-administered medications for this visit.    OBJECTIVE: Vitals:   11/26/22 1426  BP: 134/77  Pulse: 65  Resp: 18  Temp: (!) 96.5 F (35.8 C)  SpO2: 100%      Body mass index is 21.61 kg/m.    ECOG FS:0 - Asymptomatic  General: Well-developed, well-nourished, no acute distress. Eyes: Pink conjunctiva, anicteric sclera. HEENT: Normocephalic, moist mucous membranes. Lungs: No audible wheezing or coughing. Heart: Regular rate and rhythm. Abdomen: Soft, nontender, no obvious distention. Musculoskeletal: No edema, cyanosis, or clubbing. Neuro: Alert, answering all questions appropriately. Cranial nerves grossly intact. Skin: No rashes or petechiae noted. Psych: Normal affect.  LAB RESULTS:  Lab Results  Component Value Date   NA 142 11/26/2022   K 3.4 (L) 11/26/2022   CL 104 11/26/2022   CO2 30 11/26/2022   GLUCOSE 107 (H) 11/26/2022   BUN 15 11/26/2022   CREATININE 1.00 11/26/2022   CALCIUM 9.0 11/26/2022   PROT 6.6 11/26/2022   ALBUMIN 4.1 11/26/2022   AST 20 11/26/2022   ALT 47 (H) 11/26/2022   ALKPHOS 63 11/26/2022   BILITOT 0.3 11/26/2022   GFRNONAA 59 (L) 11/26/2022   GFRAA 54 (L) 10/26/2019    Lab Results  Component Value Date   WBC 5.1 11/26/2022   NEUTROABS 3.7 11/26/2022   HGB 13.5 11/26/2022   HCT 39.6 11/26/2022   MCV 96.1 11/26/2022   PLT 112 (L) 11/26/2022     STUDIES: No results found.   ASSESSMENT: AML.  PLAN:    AML: Confirmed by bone marrow biopsy.  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  Patient's most recent bone marrow biopsy on March 23, 2022 revealed an increase of her blast count from 4% previously to up to 15% currently.  She last received treatment with cycle 22 of Vidaza on February 13, 2022.  Given her progressive disease and  IDH-1 mutation, Vidaza was discontinued and patient initially received 150 mg twice daily of Olutasidenib (Rezlidhia).  This has been now dose reduce to once per day secondary to nausea and thrombocytopenia.  Return to clinic in 4 weeks for laboratory  work and evaluation by clinical pharmacy and then in 8 weeks for laboratory work, further evaluation, and continuation of treatment.  Will plan  to repeat bone marrow biopsy in November 2024. Neutropenia: Resolved.  Continue treatment as above. Thrombocytopenia: Improving.  Patient's platelet count is 112 today.  Continue dose reduced treatment once per day as above. Diarrhea: Patient does not complain of this today.  Continue Lomotil as needed.   Anemia: Resolved. Nausea: Improved with dose reduction.  Continue ondansetron and Compazine as needed.   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  Patient also received chemotherapy and likely received Adriamycin, but exact regimen is unknown.  She completed 5 years of hormonal therapy in approximately June 2015. Currently, she has no evidence of disease.  Her most recent mammogram on July 29, 2021 was reported as BI-RADS 2.  Repeat in March 2024. History of colon cancer: Patient also states that she received chemotherapy for this, possibly FOLFOX but again this is unknown. Hair thinning: Improving.  Likely related to thyroid dosing. Urinary complaints: Patient was unable to give a urinary sample today.  Patient was given a collection bottle to try at home.  Patient expressed understanding and was in agreement with this plan. She also understands that She can call clinic at any time with any questions, concerns, or complaints.    Cancer Staging  History of breast cancer Staging form: Breast, AJCC 7th Edition - Clinical stage from 01/07/2016: Stage IA (T1c, N0, M0) - Signed by Jeralyn Ruths, MD on 01/07/2016 Laterality: Right Estrogen receptor status: Positive Progesterone receptor status: Positive HER2 status: Negative   Jeralyn Ruths, MD   11/26/2022 5:13 PM

## 2022-11-26 NOTE — Progress Notes (Signed)
Patient is concerned about her bladder, she's going more frequently and had an accident for the first time ever yesterday.

## 2022-11-26 NOTE — Telephone Encounter (Signed)
She is overdue a mammogram.  Order is in epic.  Need to schedule.  She has a history of breast cancer.

## 2022-11-27 NOTE — Telephone Encounter (Signed)
Called patient, unable to leave message. Noted on schedule for 8/9 appt to schedule mammo.

## 2022-12-02 ENCOUNTER — Other Ambulatory Visit: Payer: Self-pay | Admitting: Oncology

## 2022-12-02 ENCOUNTER — Other Ambulatory Visit: Payer: Self-pay

## 2022-12-02 DIAGNOSIS — N390 Urinary tract infection, site not specified: Secondary | ICD-10-CM

## 2022-12-02 DIAGNOSIS — C9200 Acute myeloblastic leukemia, not having achieved remission: Secondary | ICD-10-CM | POA: Diagnosis not present

## 2022-12-02 LAB — URINALYSIS, COMPLETE (UACMP) WITH MICROSCOPIC
Bacteria, UA: NONE SEEN
Bilirubin Urine: NEGATIVE
Glucose, UA: NEGATIVE mg/dL
Ketones, ur: NEGATIVE mg/dL
Nitrite: NEGATIVE
Protein, ur: 30 mg/dL — AB
Specific Gravity, Urine: 1.018 (ref 1.005–1.030)
pH: 5 (ref 5.0–8.0)

## 2022-12-02 MED ORDER — NITROFURANTOIN MONOHYD MACRO 100 MG PO CAPS: 100.0000 mg | ORAL_CAPSULE | Freq: Two times a day (BID) | ORAL | 0 refills | Status: DC

## 2022-12-11 ENCOUNTER — Ambulatory Visit: Payer: PPO | Admitting: Internal Medicine

## 2022-12-17 ENCOUNTER — Other Ambulatory Visit (HOSPITAL_COMMUNITY): Payer: Self-pay

## 2022-12-21 ENCOUNTER — Other Ambulatory Visit: Payer: Self-pay

## 2022-12-24 ENCOUNTER — Inpatient Hospital Stay: Payer: PPO | Attending: Oncology

## 2022-12-24 ENCOUNTER — Inpatient Hospital Stay: Payer: PPO | Admitting: Pharmacist

## 2022-12-24 ENCOUNTER — Other Ambulatory Visit (HOSPITAL_COMMUNITY): Payer: Self-pay

## 2022-12-24 VITALS — BP 137/75 | HR 76 | Resp 16 | Ht 64.0 in | Wt 124.7 lb

## 2022-12-24 DIAGNOSIS — C9200 Acute myeloblastic leukemia, not having achieved remission: Secondary | ICD-10-CM | POA: Insufficient documentation

## 2022-12-24 LAB — COMPREHENSIVE METABOLIC PANEL
ALT: 43 U/L (ref 0–44)
AST: 18 U/L (ref 15–41)
Albumin: 4 g/dL (ref 3.5–5.0)
Alkaline Phosphatase: 59 U/L (ref 38–126)
Anion gap: 8 (ref 5–15)
BUN: 17 mg/dL (ref 8–23)
CO2: 29 mmol/L (ref 22–32)
Calcium: 9 mg/dL (ref 8.9–10.3)
Chloride: 104 mmol/L (ref 98–111)
Creatinine, Ser: 1.02 mg/dL — ABNORMAL HIGH (ref 0.44–1.00)
GFR, Estimated: 58 mL/min — ABNORMAL LOW (ref >60)
Glucose, Bld: 112 mg/dL — ABNORMAL HIGH (ref 70–99)
Potassium: 3.8 mmol/L (ref 3.5–5.1)
Sodium: 141 mmol/L (ref 135–145)
Total Bilirubin: 0.8 mg/dL (ref 0.3–1.2)
Total Protein: 6.7 g/dL (ref 6.5–8.1)

## 2022-12-24 LAB — CBC WITH DIFFERENTIAL/PLATELET
Abs Immature Granulocytes: 0 10*3/uL (ref 0.00–0.07)
Basophils Absolute: 0 10*3/uL (ref 0.0–0.1)
Basophils Relative: 0 %
Eosinophils Absolute: 0.1 10*3/uL (ref 0.0–0.5)
Eosinophils Relative: 2 %
HCT: 38.9 % (ref 36.0–46.0)
Hemoglobin: 13.4 g/dL (ref 12.0–15.0)
Immature Granulocytes: 0 %
Lymphocytes Relative: 18 %
Lymphs Abs: 0.8 10*3/uL (ref 0.7–4.0)
MCH: 32.1 pg (ref 26.0–34.0)
MCHC: 34.4 g/dL (ref 30.0–36.0)
MCV: 93.3 fL (ref 80.0–100.0)
Monocytes Absolute: 0.4 10*3/uL (ref 0.1–1.0)
Monocytes Relative: 8 %
Neutro Abs: 3.3 10*3/uL (ref 1.7–7.7)
Neutrophils Relative %: 72 %
Platelets: 104 10*3/uL — ABNORMAL LOW (ref 150–400)
RBC: 4.17 MIL/uL (ref 3.87–5.11)
RDW: 13.4 % (ref 11.5–15.5)
WBC: 4.6 10*3/uL (ref 4.0–10.5)
nRBC: 0 % (ref 0.0–0.2)

## 2022-12-24 NOTE — Progress Notes (Signed)
Oral Chemotherapy Clinic [facility]  Telephone:([phone] Fax:([phone]  Patient Care Team: Dale Andrews, MD as PCP - General (Internal Medicine) Iran Ouch, MD as PCP - Cardiology (Cardiology) Jeralyn Ruths, MD as Consulting Physician (Oncology)   Name of the patient: Debra Hale  [id number]  [birth year]   Date of visit: 12/24/22  HPI: Patient is a 72 y.o. female with AML. Previously treated with azacitadine and venetoclax. A repeat BM biopsy showed disease progression. 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 She was dose reduced to olutasidenib 100mg  daily on 06/22/22 due to decreased pltc.   Reason for Consult: Oral chemotherapy follow-up for Rezlidhia (olutasidenib) therapy.   PAST MEDICAL HISTORY: Past Medical History:  Diagnosis Date   Abdominal pain 03/06/2016   Acute pericarditis 05/31/2013   Anxiety    Arthritis    Osteoarthritis   Breast cancer (HCC) 2009   right breast lumpectomy with rad tx   Colon cancer (HCC)    surgery with chemo and rad tx   Complication of anesthesia    GERD (gastroesophageal reflux disease)    Heart palpitations    History of hiatal hernia    History of kidney stones    History of thyroid cancer 09/18/2008   Qualifier: Diagnosis of   By: Denyse Amass CMA, Carol       Hypothyroidism    Liver disease    Liver nodule    s/p negative biopsy   Malignant neoplasm of thyroid gland (HCC) 2002   s/p surgery and XRT   Osteoporosis    Other and unspecified hyperlipidemia    Palpitations    Personal history of chemotherapy    Personal history of malignant neoplasm of large intestine    carcinoma - cecum, s/p right laparoscopic colectomy - s/p chemotherapy and XRT   Personal history of radiation therapy    Pneumonia 2019   PONV (postoperative nausea and vomiting)    Pure hypercholesterolemia    Unspecified hereditary and idiopathic peripheral  neuropathy     HEMATOLOGY/ONCOLOGY HISTORY:  Oncology History  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 Initial Diagnosis   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HCC)   06/10/2020 - 12/19/2021 Chemotherapy   Patient is on Treatment Plan : AML dose reduced azacitidine SQ D1-5 q28d     06/10/2020 - 02/13/2022 Chemotherapy   Patient is on Treatment Plan : AML Azacitidine SQ D1-5 q28d       ALLERGIES:  is allergic to demeclocycline, sulfa antibiotics, tetracyclines & related, levofloxacin, augmentin [amoxicillin-pot clavulanate], bentyl [dicyclomine hcl], ciprofloxacin, codeine, dicyclomine hcl, epinephrine, flagyl [metronidazole], librax [chlordiazepoxide-clidinium], novocain [procaine], phenobarbital, prednisone, and ultram [tramadol].  MEDICATIONS:  Current Outpatient Medications  Medication Sig Dispense Refill   Azelastine-Fluticasone 137-50 MCG/ACT SUSP SPRAY 2 SPRAYS INTO EACH NOSTRIL EVERY DAY 23 g 2   clonazePAM (KLONOPIN) 0.5 MG tablet Take 1 tablet (0.5 mg total) by mouth 3 (three) times daily as needed for anxiety. 30 tablet 0   escitalopram (LEXAPRO) 20 MG tablet Take 20 mg by mouth every morning.      levothyroxine (SYNTHROID) 100 MCG tablet Take 1 tablet (100 mcg total) by mouth daily. 90 tablet 0   loperamide (IMODIUM) 2 MG capsule Take by mouth. (Patient not taking: Reported on 11/26/2022)     metoprolol tartrate (LOPRESSOR) 25 MG tablet TAKE 1 TABLET (25 MG TOTAL) BY MOUTH AS NEEDED (AS NEEDED UP TO TWICE DAILY FOR PALPITATIONS). (Patient not taking: Reported on 09/18/2022)  60 tablet 1   nitrofurantoin, macrocrystal-monohydrate, (MACROBID) 100 MG capsule Take 1 capsule (100 mg total) by mouth 2 (two) times daily. 10 capsule 0   olutasidenib (REZLIDHIA) 150 MG capsule Take 1 capsule (150 mg total) by mouth daily. Take on an empty stomach at least 1 hour before or 2 hours after a meal. 30 capsule 2   pantoprazole (PROTONIX) 40 MG tablet TAKE ONE TABLET (40 MG) BY MOUTH EVERY  DAY 90 tablet 2   potassium chloride SA (KLOR-CON M) 20 MEQ tablet Take 1 tablet (20 mEq total) by mouth daily. (Patient not taking: Reported on 11/26/2022) 30 tablet 2   prochlorperazine (COMPAZINE) 10 MG tablet TAKE ONE TABLET BY MOUTH EVERY 6 HOURS AS NEEDED FOR NAUSEA / VOMITING 30 tablet 3   valACYclovir (VALTREX) 500 MG tablet TAKE ONE TABLET BY MOUTH DAILY 30 tablet 2   No current facility-administered medications for this visit.    VITAL SIGNS: BP 137/75   Pulse 76   Resp 16   Ht 5\' 4"  (1.626 m)   Wt 56.6 kg (124 lb 11.2 oz)   SpO2 98%   BMI 21.40 kg/m  Filed Weights   12/24/22 1443  Weight: 56.6 kg (124 lb 11.2 oz)     Estimated body mass index is 21.4 kg/m as calculated from the following:   Height as of this encounter: 5\' 4"  (1.626 m).   Weight as of this encounter: 56.6 kg (124 lb 11.2 oz).  LABS: CBC:    Component Value Date/Time   WBC 4.6 12/24/2022 1430   HGB 13.4 12/24/2022 1430   HGB 12.5 05/09/2018 1147   HCT 38.9 12/24/2022 1430   HCT 36.8 05/09/2018 1147   PLT 104 (L) 12/24/2022 1430   PLT 285 05/09/2018 1147   MCV 93.3 12/24/2022 1430   MCV 86 05/09/2018 1147   MCV 89 11/30/2013 1419   NEUTROABS 3.3 12/24/2022 1430   NEUTROABS 2.9 11/20/2013 1404   LYMPHSABS 0.8 12/24/2022 1430   LYMPHSABS 0.9 (L) 11/20/2013 1404   MONOABS 0.4 12/24/2022 1430   MONOABS 0.2 11/20/2013 1404   EOSABS 0.1 12/24/2022 1430   EOSABS 0.1 11/20/2013 1404   BASOSABS 0.0 12/24/2022 1430   BASOSABS 0.0 11/20/2013 1404   Comprehensive Metabolic Panel:    Component Value Date/Time   NA 141 12/24/2022 1430   NA 143 05/09/2018 1147   NA 145 11/30/2013 1419   K 3.8 12/24/2022 1430   K 3.5 11/30/2013 1419   CL 104 12/24/2022 1430   CL 109 (H) 11/30/2013 1419   CO2 29 12/24/2022 1430   CO2 29 11/30/2013 1419   BUN 17 12/24/2022 1430   BUN 11 05/09/2018 1147   BUN 15 11/30/2013 1419   CREATININE 1.02 (H) 12/24/2022 1430   CREATININE 1.04 11/30/2013 1419   GLUCOSE  112 (H) 12/24/2022 1430   GLUCOSE 97 11/30/2013 1419   CALCIUM 9.0 12/24/2022 1430   CALCIUM 8.7 11/30/2013 1419   AST 18 12/24/2022 1430   AST 28 11/30/2013 1419   ALT 43 12/24/2022 1430   ALT 71 (H) 11/30/2013 1419   ALKPHOS 59 12/24/2022 1430   ALKPHOS 114 11/30/2013 1419   BILITOT 0.8 12/24/2022 1430   BILITOT 0.7 11/30/2013 1419   PROT 6.7 12/24/2022 1430   PROT 7.1 11/30/2013 1419   ALBUMIN 4.0 12/24/2022 1430   ALBUMIN 3.3 (L) 11/30/2013 1419     Present during today's visit: patient only  Assessment and Plan: CMP/CBC reviewed, continue reduced dose  olutasidenib 150mg  daily  Patient  continues to report feeling "jittery" or anxious off and on. The provider who prescribed her escitalopram recently dose increased her to 30mg . Hopefully this will help.  Patient continues to report hoarseness remindered her to try hot tea/water or lozenges. This is not a reported side effect of olutasidenib, may be related to sinus drainage.    Oral Chemotherapy Side Effect/Intolerance:  Nausea and diarrhea: currently manageable  Oral Chemotherapy Adherence: No missed doses reported There are no patient barriers to medication adherence identified.   New medications: None reported   Medication Access Issues: No issues, patient fills at [facility].   Patient expressed understanding and was in agreement with this plan. She also understands that She can call clinic at any time with any questions, concerns, or complaints.   Follow-up plan: RTC in 4 weeks lab/MD.   Thank you for allowing me to participate in the care of this very pleasant patient.   Time Total: 15 minutes  Visit consisted of counseling and education on dealing with issues of symptom management in the setting of serious and potentially life-threatening illness.Greater than 50%  of this time was spent counseling and coordinating care related to the above assessment and plan.  Signed by: Remi Haggard, PharmD,  BCPS, Nolon Bussing, CPP Hematology/Oncology Clinical Pharmacist Practitioner [place] [phone]  12/24/2022 4:44 PM

## 2022-12-28 ENCOUNTER — Other Ambulatory Visit (HOSPITAL_COMMUNITY): Payer: Self-pay

## 2022-12-28 ENCOUNTER — Other Ambulatory Visit: Payer: Self-pay

## 2022-12-28 ENCOUNTER — Telehealth: Payer: Self-pay | Admitting: Pharmacist

## 2022-12-28 ENCOUNTER — Other Ambulatory Visit: Payer: Self-pay | Admitting: Oncology

## 2022-12-28 DIAGNOSIS — C9200 Acute myeloblastic leukemia, not having achieved remission: Secondary | ICD-10-CM

## 2022-12-28 MED ORDER — OLUTASIDENIB 150 MG PO CAPS
150.0000 mg | ORAL_CAPSULE | Freq: Every day | ORAL | 2 refills | Status: DC
  Filled 2022-12-28: qty 30, 30d supply, fill #0
  Filled 2023-01-19: qty 30, 30d supply, fill #1
  Filled 2023-02-22: qty 30, 30d supply, fill #2

## 2022-12-28 NOTE — Telephone Encounter (Signed)
Oral Chemotherapy Pharmacist Encounter   Patient called to report numbness in her hands. When asking whow long this has been present, patient reported at least 6 weeks. When asked if she was experiencing numbness in her feet, she only reported that her legs felt weaker and she thought she was stumbling a bit more. She reports this has been the case for about 3-4 weeks.   Of note, patient was seen in clinic last Friday and did not mention hand numbness or stumbling.  Reviewed the above with Dr. Orlie Dakin. Referral placed for cancer center Oletta Cohn, OT for evaluation.    Remi Haggard, PharmD, BCPS, BCOP, CPP Hematology/Oncology Clinical Pharmacist [place] [phone]  12/28/2022 11:59 AM

## 2022-12-30 ENCOUNTER — Other Ambulatory Visit (HOSPITAL_COMMUNITY): Payer: Self-pay

## 2022-12-30 ENCOUNTER — Inpatient Hospital Stay: Payer: PPO | Admitting: Occupational Therapy

## 2022-12-30 ENCOUNTER — Encounter: Payer: Self-pay | Admitting: Internal Medicine

## 2022-12-30 ENCOUNTER — Ambulatory Visit (INDEPENDENT_AMBULATORY_CARE_PROVIDER_SITE_OTHER): Payer: PPO | Admitting: Internal Medicine

## 2022-12-30 VITALS — BP 118/74 | HR 89 | Temp 98.0°F | Resp 16 | Ht 64.0 in | Wt 122.6 lb

## 2022-12-30 DIAGNOSIS — C9200 Acute myeloblastic leukemia, not having achieved remission: Secondary | ICD-10-CM

## 2022-12-30 DIAGNOSIS — K219 Gastro-esophageal reflux disease without esophagitis: Secondary | ICD-10-CM

## 2022-12-30 DIAGNOSIS — T451X5A Adverse effect of antineoplastic and immunosuppressive drugs, initial encounter: Secondary | ICD-10-CM

## 2022-12-30 DIAGNOSIS — G620 Drug-induced polyneuropathy: Secondary | ICD-10-CM | POA: Diagnosis not present

## 2022-12-30 DIAGNOSIS — Z85850 Personal history of malignant neoplasm of thyroid: Secondary | ICD-10-CM

## 2022-12-30 DIAGNOSIS — E7800 Pure hypercholesterolemia, unspecified: Secondary | ICD-10-CM

## 2022-12-30 DIAGNOSIS — F320 Major depressive disorder, single episode, mild: Secondary | ICD-10-CM | POA: Diagnosis not present

## 2022-12-30 DIAGNOSIS — F419 Anxiety disorder, unspecified: Secondary | ICD-10-CM

## 2022-12-30 DIAGNOSIS — R197 Diarrhea, unspecified: Secondary | ICD-10-CM | POA: Diagnosis not present

## 2022-12-30 DIAGNOSIS — D61818 Other pancytopenia: Secondary | ICD-10-CM

## 2022-12-30 DIAGNOSIS — R319 Hematuria, unspecified: Secondary | ICD-10-CM | POA: Diagnosis not present

## 2022-12-30 DIAGNOSIS — I5032 Chronic diastolic (congestive) heart failure: Secondary | ICD-10-CM | POA: Diagnosis not present

## 2022-12-30 DIAGNOSIS — F439 Reaction to severe stress, unspecified: Secondary | ICD-10-CM

## 2022-12-30 DIAGNOSIS — Z853 Personal history of malignant neoplasm of breast: Secondary | ICD-10-CM

## 2022-12-30 DIAGNOSIS — E039 Hypothyroidism, unspecified: Secondary | ICD-10-CM

## 2022-12-30 LAB — LIPID PANEL
Cholesterol: 186 mg/dL (ref 0–200)
HDL: 57.2 mg/dL (ref >39.00)
LDL Cholesterol: 105 mg/dL — ABNORMAL HIGH (ref 0–99)
NonHDL: 129.01
Total CHOL/HDL Ratio: 3
Triglycerides: 118 mg/dL (ref 0.0–149.0)
VLDL: 23.6 mg/dL (ref 0.0–40.0)

## 2022-12-30 LAB — COMPREHENSIVE METABOLIC PANEL
ALT: 49 U/L — ABNORMAL HIGH (ref 0–35)
AST: 19 U/L (ref 0–37)
Albumin: 4.1 g/dL (ref 3.5–5.2)
Alkaline Phosphatase: 64 U/L (ref 39–117)
BUN: 14 mg/dL (ref 6–23)
CO2: 32 mEq/L (ref 19–32)
Calcium: 9.5 mg/dL (ref 8.4–10.5)
Chloride: 103 mEq/L (ref 96–112)
Creatinine, Ser: 1 mg/dL (ref 0.40–1.20)
GFR: 56.04 mL/min — ABNORMAL LOW (ref >60.00)
Glucose, Bld: 104 mg/dL — ABNORMAL HIGH (ref 70–99)
Potassium: 3.6 mEq/L (ref 3.5–5.1)
Sodium: 143 mEq/L (ref 135–145)
Total Bilirubin: 0.6 mg/dL (ref 0.2–1.2)
Total Protein: 6.7 g/dL (ref 6.0–8.3)

## 2022-12-30 LAB — TSH: TSH: 0.27 u[IU]/mL — ABNORMAL LOW (ref 0.35–5.50)

## 2022-12-30 NOTE — Therapy (Signed)
[facility] at [facility] [address], [address] [place], [place], [address] Phone: [phone]   Fax:  [phone]  Occupational Therapy Screen  Patient Details  Name: Debra Hale MRN: [id number] Date of Birth: [birth year] No data recorded  Encounter Date: 12/30/2022   OT End of Session - 12/30/22 1453     Visit Number 0             Past Medical History:  Diagnosis Date   Abdominal pain 03/06/2016   Acute pericarditis 05/31/2013   Anxiety    Arthritis    Osteoarthritis   Breast cancer (HCC) 2009   right breast lumpectomy with rad tx   Colon cancer (HCC)    surgery with chemo and rad tx   Complication of anesthesia    GERD (gastroesophageal reflux disease)    Heart palpitations    History of hiatal hernia    History of kidney stones    History of thyroid cancer 09/18/2008   Qualifier: Diagnosis of   By: Denyse Amass CMA, Carol       Hypothyroidism    Liver disease    Liver nodule    s/p negative biopsy   Malignant neoplasm of thyroid gland (HCC) 2002   s/p surgery and XRT   Osteoporosis    Other and unspecified hyperlipidemia    Palpitations    Personal history of chemotherapy    Personal history of malignant neoplasm of large intestine    carcinoma - cecum, s/p right laparoscopic colectomy - s/p chemotherapy and XRT   Personal history of radiation therapy    Pneumonia 2019   PONV (postoperative nausea and vomiting)    Pure hypercholesterolemia    Unspecified hereditary and idiopathic peripheral neuropathy     Past Surgical History:  Procedure Laterality Date   APPENDECTOMY  1985   BREAST BIOPSY Right 2009   positive   BREAST BIOPSY Right 2009   negative   BREAST LUMPECTOMY Right 2009   breast cancer   CHOLECYSTECTOMY  1995   COLONOSCOPY     COLONOSCOPY WITH PROPOFOL N/A 10/29/2017   Procedure: COLONOSCOPY WITH PROPOFOL;  Surgeon: Scot Jun, MD;  Location: [facility] ENDOSCOPY;  Service: Endoscopy;  Laterality:  N/A;   DILATION AND CURETTAGE OF UTERUS  1990   DILATION AND CURETTAGE, DIAGNOSTIC / THERAPEUTIC  1990   ESOPHAGOGASTRODUODENOSCOPY     ESOPHAGOGASTRODUODENOSCOPY (EGD) WITH PROPOFOL N/A 10/29/2017   Procedure: ESOPHAGOGASTRODUODENOSCOPY (EGD) WITH PROPOFOL;  Surgeon: Scot Jun, MD;  Location: [facility] ENDOSCOPY;  Service: Endoscopy;  Laterality: N/A;   INCISION AND DRAINAGE PERIRECTAL ABSCESS N/A 06/17/2020   Procedure: IRRIGATION AND DEBRIDEMENT PERIRECTAL ABSCESS;  Surgeon: Leafy Ro, MD;  Location: [facility] ORS;  Service: General;  Laterality: N/A;   IR BONE MARROW BIOPSY & ASPIRATION  07/09/2021   LAPAROSCOPIC PARTIAL COLECTOMY     stage 3-C carcinoma of the cecum, s/p chemotherapy and xrt   LITHOTRIPSY     SIGMOIDOSCOPY  08/26/1993   THYROID LOBECTOMY  2002   s/p XRT   TOTAL HIP ARTHROPLASTY Right 10/24/2019   Procedure: TOTAL HIP ARTHROPLASTY;  Surgeon: Christena Flake, MD;  Location: [facility] ORS;  Service: Orthopedics;  Laterality: Right;    There were no vitals filed for this visit.   Subjective Assessment - 12/30/22 1453     Subjective  My hands have been numb for about 10 years.  I have numbness in the feet 2.  She is having a harder time like  washing my hair and getting dressed.  As well as writing.  And then a shuffling more I do not pick up my feet.  Did not had any falls    Currently in Pain? No/denies               Oral Chemotherapy Pharmacist Encounter    Patient called to report numbness in her hands. When asking whow long this has been present, patient reported at least 6 weeks. When asked if she was experiencing numbness in her feet, she only reported that her legs felt weaker and she thought she was stumbling a bit more. She reports this has been the case for about 3-4 weeks.    Of note, patient was seen in clinic last Friday and did not mention hand numbness or stumbling.   Reviewed the above with Dr. Orlie Dakin. Referral placed for cancer center Oletta Cohn, OT for evaluation.      Remi Haggard, PharmD, BCPS, BCOP, CPP Hematology/Oncology Clinical Pharmacist [facility] [phone]   12/28/2022 11:59 AM    OT SCREEN 12/30/22: Patient referred for increased numbness in hands and feet.  But per patient hands has been numb for a few years.  But having recently more trouble with washing her hair as well as writing and getting dressed.  She did modify by not using clothing fasteners.  Patient reports more shuffling with feet.  But denies any falls in the last 6 months. Patient is active range of motion in bilateral hands within normal limits as well as functional grip within normal limits but reviewed with the patient with neuropathy and proprioception she will having trouble with ADLs and IADLs and need to modify.  Reviewed with patient some modifications to increase independence as well as some websites for adaptive equipment. On the Berg balance test patient scored 50/56 putting her at low risk for falling but patient to have trouble picking up her feet when walking or doing lower extremity exercises.  Patient more sedentary at home. Reports one-story house with a walk-in shower and a tub shower combo.  But may be moving soon. Discussed with patient CARE program -patient interested to participate.  Will help for conditioning, strengthening and independence as well as social interaction.  Will request order to be sent from Dr. Orlie Dakin  I agree to be send to the CARE  program.                                   Visit Diagnosis: Chemotherapy-induced peripheral neuropathy [facility])    Problem List Patient Active Problem List   Diagnosis Date Noted   Acute metabolic encephalopathy 04/04/2022   Depression with anxiety 04/04/2022   Hypokalemia 04/04/2022   Diarrhea 04/04/2022   Elevated lactic acid level 04/04/2022   Chronic diastolic CHF (congestive heart failure) (HCC) 04/04/2022    Hypophosphatemia 04/04/2022   Neutropenia with fever (HCC) 04/04/2022   UTI (urinary tract infection) 04/01/2022   Nausea vomiting and diarrhea 03/31/2022   Electrolyte abnormality 03/31/2022   Hydronephrosis of right kidney 03/31/2022   Vaginal dryness 01/05/2022   Liver nodule 11/18/2021   Major depressive disorder, single episode, mild (HCC) 06/01/2021   Perirectal abscess 06/16/2020   Fatty liver disease, nonalcoholic 04/18/2020   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   Pancytopenia (HCC) 02/11/2020   Trochanteric bursitis, left hip 02/02/2020   Trochanteric bursitis, right hip 02/02/2020   Status post total  hip replacement, 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   Lumbar spondylosis 08/25/2019   Lumbar strain 08/25/2019   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/10/2019   Rectal bleed 06/17/2019   Pleural effusion 12/03/2018   Right hip pain 12/03/2018   Leukopenia 08/21/2018   Change in vision 06/24/2018   Chronic daily headache 05/31/2018   Elevated erythrocyte sedimentation rate 05/31/2018   Hypothyroidism 05/02/2018   Neck pain 05/02/2018   Nonintractable headache 04/18/2018   Thrush 04/18/2018   Pleural effusion associated with pulmonary infection 04/18/2018   Other fatigue 04/18/2018   Acute right flank pain 03/06/2016   Acute diarrhea 01/10/2016   Neck nodule 10/13/2015   Vitamin D deficiency 07/27/2015   Loose stools 04/14/2015   Health care maintenance 06/17/2014   Flatulence, eructation and gas pain 06/17/2014   Stress 01/28/2014   Acute stress reaction 01/28/2014   Abnormal respiratory rate 06/19/2013   Environmental allergies 02/25/2013   Abnormal liver function test 12/07/2012   Anxiety 05/06/2012   GERD (gastroesophageal reflux disease) 05/06/2012   Osteopenia 05/06/2012   Hematuria 05/06/2012   Osteoporosis 05/06/2012   History of breast cancer 09/18/2008   History of thyroid cancer 09/18/2008   Hypercholesterolemia 09/18/2008   Chemotherapy-induced  neuropathy (HCC) 09/18/2008   Palpitations 09/18/2008   History of malignant neoplasm of large intestine 09/18/2008   Malignant neoplasm of breast (female) (HCC) 09/18/2008   Malignant neoplasm of thyroid gland (HCC) 09/18/2008   Hereditary and idiopathic peripheral neuropathy 09/18/2008    Oletta Cohn, OTR/L,CLT 12/30/2022, 2:54 PM  [facility] at [facility] [address], [address] [place], [place], [address] Phone: [phone]   Fax:  [phone]  Name: Debra Hale MRN: [id number] Date of Birth: [birth year]

## 2022-12-30 NOTE — Patient Instructions (Signed)
Pepcid 20mg  - take 1 tablet 30 minutes before evening meal.

## 2022-12-30 NOTE — Progress Notes (Signed)
Subjective:    Patient ID: Debra Hale, female    DOB: [birth year], 72 y.o.   MRN: [id number]  Patient here for  Chief Complaint  Patient presents with   Medical Management of Chronic Issues    HPI Here to follow up regarding increased stress/anxiety, GERD and hypercholesterolemia. Also seeing Dr Orlie Dakin - AML. 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  Dose reduced due to nausea and thrombocytopenia. Sees psychiatry.  On lexapro.  Has clonazepam.  Takes scheduled bid.  Also recently had lexapro increased to 30mg  q day.  Reports not feeling well.  Feels jittery inside.  Discussed with psychiatry and medication changes made as outlined.  Breathing overall stable.  No chest pain reported. Does report persistent increased diarrhea.  Diarrhea - 2 hours after eating.  Also reports will feel bloated and reports increased gas.  Fatigue.     Past Medical History:  Diagnosis Date   Abdominal pain 03/06/2016   Acute pericarditis 05/31/2013   Anxiety    Arthritis    Osteoarthritis   Breast cancer (HCC) 2009   right breast lumpectomy with rad tx   Colon cancer (HCC)    surgery with chemo and rad tx   Complication of anesthesia    GERD (gastroesophageal reflux disease)    Heart palpitations    History of hiatal hernia    History of kidney stones    History of thyroid cancer 09/18/2008   Qualifier: Diagnosis of   By: Denyse Amass CMA, Carol       Hypothyroidism    Liver disease    Liver nodule    s/p negative biopsy   Malignant neoplasm of thyroid gland (HCC) 2002   s/p surgery and XRT   Osteoporosis    Other and unspecified hyperlipidemia    Palpitations    Personal history of chemotherapy    Personal history of malignant neoplasm of large intestine    carcinoma - cecum, s/p right laparoscopic colectomy - s/p chemotherapy and XRT   Personal history of radiation therapy    Pneumonia 2019   PONV (postoperative nausea and vomiting)    Pure hypercholesterolemia     Unspecified hereditary and idiopathic peripheral neuropathy    Past Surgical History:  Procedure Laterality Date   APPENDECTOMY  1985   BREAST BIOPSY Right 2009   positive   BREAST BIOPSY Right 2009   negative   BREAST LUMPECTOMY Right 2009   breast cancer   CHOLECYSTECTOMY  1995   COLONOSCOPY     COLONOSCOPY WITH PROPOFOL N/A 10/29/2017   Procedure: COLONOSCOPY WITH PROPOFOL;  Surgeon: Scot Jun, MD;  Location: [facility] ENDOSCOPY;  Service: Endoscopy;  Laterality: N/A;   DILATION AND CURETTAGE OF UTERUS  1990   DILATION AND CURETTAGE, DIAGNOSTIC / THERAPEUTIC  1990   ESOPHAGOGASTRODUODENOSCOPY     ESOPHAGOGASTRODUODENOSCOPY (EGD) WITH PROPOFOL N/A 10/29/2017   Procedure: ESOPHAGOGASTRODUODENOSCOPY (EGD) WITH PROPOFOL;  Surgeon: Scot Jun, MD;  Location: [facility] ENDOSCOPY;  Service: Endoscopy;  Laterality: N/A;   INCISION AND DRAINAGE PERIRECTAL ABSCESS N/A 06/17/2020   Procedure: IRRIGATION AND DEBRIDEMENT PERIRECTAL ABSCESS;  Surgeon: Leafy Ro, MD;  Location: [facility] ORS;  Service: General;  Laterality: N/A;   IR BONE MARROW BIOPSY & ASPIRATION  07/09/2021   LAPAROSCOPIC PARTIAL COLECTOMY     stage 3-C carcinoma of the cecum, s/p chemotherapy and xrt   LITHOTRIPSY     SIGMOIDOSCOPY  08/26/1993   THYROID LOBECTOMY  2002   s/p  XRT   TOTAL HIP ARTHROPLASTY Right 10/24/2019   Procedure: TOTAL HIP ARTHROPLASTY;  Surgeon: Christena Flake, MD;  Location: [facility] ORS;  Service: Orthopedics;  Laterality: Right;   Family History  Problem Relation Age of Onset   Stroke Mother        64s   Alzheimer's disease Mother    Lung cancer Father    Prostate cancer Father    Cancer Father        Colon   Colon cancer Father    Breast cancer Sister        31's   Lung cancer Sister    Breast cancer Maternal Aunt    Social History   Socioeconomic History   Marital status: Single    Spouse name: Not on file   Number of children: 0   Years of education: Not on file   Highest education  level: Not on file  Occupational History   Occupation: Retired  Tobacco Use   Smoking status: Never   Smokeless tobacco: Never  Vaping Use   Vaping status: Never Used  Substance and Sexual Activity   Alcohol use: No    Alcohol/week: 0.0 standard drinks of alcohol   Drug use: No   Sexual activity: Not Currently  Other Topics Concern   Not on file  Social History Narrative   Not on file   Social Determinants of Health   Financial Resource Strain: Low Risk  (09/18/2022)   Overall Financial Resource Strain (CARDIA)    Difficulty of Paying Living Expenses: Not hard at all  Food Insecurity: No Food Insecurity (09/18/2022)   Hunger Vital Sign    Worried About Running Out of Food in the Last Year: Never true    Ran Out of Food in the Last Year: Never true  Transportation Needs: No Transportation Needs (09/18/2022)   PRAPARE - Administrator, Civil Service (Medical): No    Lack of Transportation (Non-Medical): No  Physical Activity: Insufficiently Active (09/18/2022)   Exercise Vital Sign    Days of Exercise per Week: 1 day    Minutes of Exercise per Session: 10 min  Stress: Stress Concern Present (09/18/2022)   [employer] of Occupational Health - Occupational Stress Questionnaire    Feeling of Stress : To some extent  Social Connections: Moderately Isolated (09/18/2022)   Social Connection and Isolation Panel [NHANES]    Frequency of Communication with Friends and Family: More than three times a week    Frequency of Social Gatherings with Friends and Family: Twice a week    Attends Religious Services: 1 to 4 times per year    Active Member of [employer] or Organizations: No    Attends Banker Meetings: Never    Marital Status: Divorced     Review of Systems  Constitutional:  Positive for fatigue. Negative for fever.  HENT:  Negative for congestion and sinus pressure.   Respiratory:  Negative for cough and chest tightness.        No increased sob.    Cardiovascular:  Negative for chest pain, palpitations and leg swelling.  Gastrointestinal:  Positive for diarrhea. Negative for vomiting.       Abdominal bloating.  Increased gas.   Genitourinary:  Negative for difficulty urinating and dysuria.  Musculoskeletal:  Negative for joint swelling and myalgias.  Skin:  Negative for color change and rash.  Neurological:  Negative for dizziness and headaches.  Psychiatric/Behavioral:  Increased stress.  Feels jittery inside.        Objective:     BP 118/74   Pulse 89   Temp 98 F (36.7 C)   Resp 16   Ht 5\' 4"  (1.626 m)   Wt 122 lb 9.6 oz (55.6 kg)   SpO2 98%   BMI 21.04 kg/m  Wt Readings from Last 3 Encounters:  12/30/22 122 lb 9.6 oz (55.6 kg)  12/24/22 124 lb 11.2 oz (56.6 kg)  11/26/22 125 lb 14.4 oz (57.1 kg)    Physical Exam Vitals reviewed.  Constitutional:      General: She is not in acute distress.    Appearance: Normal appearance.  HENT:     Head: Normocephalic and atraumatic.     Right Ear: External ear normal.     Left Ear: External ear normal.  Eyes:     General: No scleral icterus.       Right eye: No discharge.        Left eye: No discharge.     Conjunctiva/sclera: Conjunctivae normal.  Neck:     Thyroid: No thyromegaly.  Cardiovascular:     Rate and Rhythm: Normal rate and regular rhythm.  Pulmonary:     Effort: No respiratory distress.     Breath sounds: Normal breath sounds. No wheezing.  Abdominal:     General: Bowel sounds are normal.     Palpations: Abdomen is soft.     Tenderness: There is no abdominal tenderness.  Musculoskeletal:        General: No swelling or tenderness.     Cervical back: Neck supple. No tenderness.  Lymphadenopathy:     Cervical: No cervical adenopathy.  Skin:    Findings: No erythema or rash.  Neurological:     Mental Status: She is alert.  Psychiatric:        Mood and Affect: Mood normal.        Behavior: Behavior normal.      Outpatient Encounter  Medications as of 12/30/2022  Medication Sig   Azelastine-Fluticasone 137-50 MCG/ACT SUSP SPRAY 2 SPRAYS INTO EACH NOSTRIL EVERY DAY   clonazePAM (KLONOPIN) 0.5 MG tablet Take 1 tablet (0.5 mg total) by mouth 3 (three) times daily as needed for anxiety.   escitalopram (LEXAPRO) 20 MG tablet Take 20 mg by mouth every morning.    levothyroxine (SYNTHROID) 100 MCG tablet Take 1 tablet (100 mcg total) by mouth daily.   metoprolol tartrate (LOPRESSOR) 25 MG tablet TAKE 1 TABLET (25 MG TOTAL) BY MOUTH AS NEEDED (AS NEEDED UP TO TWICE DAILY FOR PALPITATIONS). (Patient not taking: Reported on 09/18/2022)   nitrofurantoin, macrocrystal-monohydrate, (MACROBID) 100 MG capsule Take 1 capsule (100 mg total) by mouth 2 (two) times daily.   olutasidenib (REZLIDHIA) 150 MG capsule Take 1 capsule (150 mg total) by mouth daily. Take on an empty stomach at least 1 hour before or 2 hours after a meal.   pantoprazole (PROTONIX) 40 MG tablet TAKE ONE TABLET (40 MG) BY MOUTH EVERY DAY   potassium chloride SA (KLOR-CON M) 20 MEQ tablet Take 1 tablet (20 mEq total) by mouth daily. (Patient not taking: Reported on 11/26/2022)   prochlorperazine (COMPAZINE) 10 MG tablet TAKE ONE TABLET BY MOUTH EVERY 6 HOURS AS NEEDED FOR NAUSEA / VOMITING   valACYclovir (VALTREX) 500 MG tablet TAKE ONE TABLET BY MOUTH DAILY   [DISCONTINUED] loperamide (IMODIUM) 2 MG capsule Take by mouth. (Patient not taking: Reported on 11/26/2022)   No facility-administered encounter  medications on file as of 12/30/2022.     Lab Results  Component Value Date   WBC 4.6 12/24/2022   HGB 13.4 12/24/2022   HCT 38.9 12/24/2022   PLT 104 (L) 12/24/2022   GLUCOSE 104 (H) 12/30/2022   CHOL 186 12/30/2022   TRIG 118.0 12/30/2022   HDL 57.20 12/30/2022   LDLDIRECT 126.0 07/11/2015   LDLCALC 105 (H) 12/30/2022   ALT 49 (H) 12/30/2022   AST 19 12/30/2022   NA 143 12/30/2022   K 3.6 12/30/2022   CL 103 12/30/2022   CREATININE 1.00 12/30/2022   BUN 14  12/30/2022   CO2 32 12/30/2022   TSH 0.27 (L) 12/30/2022   INR 1.3 (H) 04/04/2022   HGBA1C 5.3 12/10/2016       Assessment & Plan:  History of thyroid cancer Assessment & Plan: Recheck tsh to confirm wnl. Need to confirm not contributing to increased symptoms.   Orders: -     TSH  Hypercholesterolemia Assessment & Plan: Follow lipid panel.   Orders: -     Lipid panel -     Comprehensive metabolic panel  Acute myeloid leukemia not having achieved remission [facility]) Assessment & Plan: Seeing Dr Orlie Dakin - f/u AML - on rezlidhia.  Dose reduced due to nausea and thrombocytopenia. F/u oncology 11/2022 - plan f/u bone marrow biopsy 03/2023   Anxiety Assessment & Plan: Continue f/u with Dr Lenis Noon.  Increased "jittery" feeling as outlined.  Has discussed with Dr Lenis Noon.  Has clonazepam.  Recently he increased lexapro dose as outlined.  Follow.  Check routine labs, including tsh to confirm not contributing.    Chemotherapy-induced neuropathy (HCC) Assessment & Plan: Stable.    Chronic diastolic CHF (congestive heart failure) (HCC) Assessment & Plan: 2D echo on 09/05/2020 showed EF 60-65% with grade 1 diastolic dysfunction. No evidence of volume overload on exam.  Breathing stable.    Stress Assessment & Plan: Increased stress with her current medical issues.  Seeing Dr Lenis Noon. On lexapro and clonazepam.  Lexapro dose just increased.  Continue f/u with Dr Lenis Noon.    Pancytopenia [facility]) Assessment & Plan: Hematology following. Receiving chemo.    Major depressive disorder, single episode, mild [facility]) Assessment & Plan: Seeing Dr Lenis Noon.  Currently on lexapro and clonazepam.  Lexapro dose just increased. Continue f/u with psychiatry.    Hypothyroidism, unspecified type Assessment & Plan: On thyroid replacement.  Follow tsh. Check today to confirm wnl - need to confirm no contributing to jittery feeling,etc.    History of breast cancer Assessment & Plan: Mammogram 07/29/21  - Birads II. Due f/u mammogram.  Discussed. She declines mammogram.    Gastroesophageal reflux disease, unspecified whether esophagitis present Assessment & Plan: No upper symptoms reported. On protonix.    Diarrhea, unspecified type Assessment & Plan: Persistent issue.  Occurs after eating.  Has seen GI.  Benefiber, probiotics.  Consider f/u with GI.    Hematuria, unspecified type Assessment & Plan: Reviewed chart.  Last urine checked through urology.  Will need f/u urine to confirm blood clear.       [place], MD

## 2022-12-31 ENCOUNTER — Telehealth: Payer: Self-pay

## 2022-12-31 NOTE — Telephone Encounter (Signed)
-----   Message from [place] sent at 12/30/2022 10:41 PM EDT ----- Notify - tsh is suppressed.  Confirm on synthroid q day.  If so, then decrease synthroid to q day.  Will need new rx for dose.  Cholesterol stable. Kidney function stable.  One liver test slightly elevated.  Remainder of liver panel wnl. Will follow.  Is scheduled to have f/u labs at cancer center.

## 2022-12-31 NOTE — Telephone Encounter (Signed)
Unable to leave message for patient. Voicemail full. Please give results when she returns call.

## 2023-01-01 NOTE — Telephone Encounter (Signed)
Pt returned [place] LPN call. Note below was read to pt. Pt aware and understood and agree for the changes on Synthroid. Pt will be ready to pick up med.

## 2023-01-02 ENCOUNTER — Telehealth: Payer: Self-pay | Admitting: Internal Medicine

## 2023-01-04 NOTE — Assessment & Plan Note (Signed)
Hematology following. Receiving chemo.

## 2023-01-04 NOTE — Assessment & Plan Note (Signed)
No upper symptoms reported.  On protonix.   

## 2023-01-04 NOTE — Assessment & Plan Note (Signed)
Persistent issue.  Occurs after eating.  Has seen GI.  Benefiber, probiotics.  Consider f/u with GI.

## 2023-01-04 NOTE — Assessment & Plan Note (Signed)
Recheck tsh to confirm wnl. Need to confirm not contributing to increased symptoms.

## 2023-01-04 NOTE — Assessment & Plan Note (Signed)
Seeing Dr Lenis Noon.  Currently on lexapro and clonazepam.  Lexapro dose just increased. Continue f/u with psychiatry.

## 2023-01-04 NOTE — Assessment & Plan Note (Signed)
Follow lipid panel.   

## 2023-01-04 NOTE — Assessment & Plan Note (Signed)
Stable

## 2023-01-04 NOTE — Assessment & Plan Note (Addendum)
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  Dose reduced due to nausea and thrombocytopenia. F/u oncology 11/2022 - plan f/u bone marrow biopsy 03/2023

## 2023-01-04 NOTE — Assessment & Plan Note (Signed)
Mammogram 07/29/21 - Birads II. Due f/u mammogram.  Discussed. She declines mammogram.

## 2023-01-04 NOTE — Assessment & Plan Note (Signed)
2D echo on 09/05/2020 showed EF 60-65% with grade 1 diastolic dysfunction. No evidence of volume overload on exam.  Breathing stable.

## 2023-01-04 NOTE — Assessment & Plan Note (Signed)
Continue f/u with Dr Lenis Noon.  Increased "jittery" feeling as outlined.  Has discussed with Dr Lenis Noon.  Has clonazepam.  Recently he increased lexapro dose as outlined.  Follow.  Check routine labs, including tsh to confirm not contributing.

## 2023-01-04 NOTE — Assessment & Plan Note (Signed)
On thyroid replacement.  Follow tsh. Check today to confirm wnl - need to confirm no contributing to jittery feeling,etc.

## 2023-01-04 NOTE — Assessment & Plan Note (Signed)
Reviewed chart.  Last urine checked through urology.  Will need f/u urine to confirm blood clear.

## 2023-01-04 NOTE — Assessment & Plan Note (Signed)
Increased stress with her current medical issues.  Seeing Dr Lenis Noon. On lexapro and clonazepam.  Lexapro dose just increased.  Continue f/u with Dr Lenis Noon.

## 2023-01-05 ENCOUNTER — Other Ambulatory Visit: Payer: Self-pay

## 2023-01-05 MED ORDER — LEVOTHYROXINE SODIUM 88 MCG PO TABS: 88.0000 ug | ORAL_TABLET | Freq: Every day | ORAL | 0 refills | Status: DC

## 2023-01-05 NOTE — Telephone Encounter (Signed)
See result note.  

## 2023-01-05 NOTE — Telephone Encounter (Signed)
Prescription Request  01/05/2023  LOV: 12/30/2022  What is the name of the medication or equipment? SYNTHROID  Have you contacted your pharmacy to request a refill? Yes   Which pharmacy would you like this sent to[facility]   Patient notified that their request is being sent to the clinical staff for review and that they should receive a response within 2 business days.   Please advise at Mobile [phone] (mobile)

## 2023-01-05 NOTE — Telephone Encounter (Signed)
Please refuse this. We are trying to get in touch with her to change her dose to 88 mcg

## 2023-01-07 ENCOUNTER — Telehealth: Payer: Self-pay

## 2023-01-07 DIAGNOSIS — R251 Tremor, unspecified: Secondary | ICD-10-CM

## 2023-01-07 NOTE — Telephone Encounter (Signed)
Patient states she has been having nervous spells.  Patient states this started several weeks ago.  Patient states her right hand is shaking so bad that she cannot write or do anything with it.  Patient states her left hand is shaking too but not as bad.  Patient states she can't get her mind together.  Patient states she is able to walk but her right leg is kind of dragging a little bit - patient states she had a hip replacement in her right leg.  Patient states Dr. Dale Omena just changed her synthroid dosage and she has only been taking it a couple of days, so she isn't sure it is related.  I transferred call to Access Nurse.

## 2023-01-08 NOTE — Telephone Encounter (Signed)
Patient aware of below. Already contacted psych. Taking lower dose of synthroid. Urgent neurology referral placed.

## 2023-01-08 NOTE — Telephone Encounter (Signed)
Spoke with patient. She was evaluated at urgent care yesterday. She is complaining of feels jittery, hands shaking when she tries to grip anything, her gait is altered. She did confirm that nothing has changed since last OV- not worse but no improvement. Patient is taking new dose of synthroid medication. She is reaching out to psych for increased anxiety. We discussed a neurology referral and patient is agreeable to this. Per our discussion, ok to place urgent referral to Dr Sherryll Burger. Just need to confirm with you diagnosis to use.

## 2023-01-08 NOTE — Telephone Encounter (Signed)
Agree with neurology evaluation - can use diagnosis of tremors.  Make sure on lower dose of thyroid medication.  TSH was suppressed last visit.  Agree with f/u with psych to make them aware of her increased anxiety.  If any acute issues, such as weakness, etc - needs to be evaluated.

## 2023-01-13 ENCOUNTER — Telehealth: Payer: Self-pay | Admitting: Internal Medicine

## 2023-01-13 DIAGNOSIS — E876 Hypokalemia: Secondary | ICD-10-CM

## 2023-01-13 DIAGNOSIS — E039 Hypothyroidism, unspecified: Secondary | ICD-10-CM

## 2023-01-13 NOTE — Telephone Encounter (Signed)
Called and spoke to Dr Lenis Noon about Ms Noto.  He (and pt) felt she felt better on potassium supplements.  Please notify her that given that her potassium was in the normal range (low normal) - I would like to start her on kcl q day.  Recheck potassium and tsh in 2 weeks.

## 2023-01-13 NOTE — Telephone Encounter (Signed)
Dr. Janice Coffin and would like to speak with Dr. Lorin Picket. At her earliest time. His number is  [phone].

## 2023-01-13 NOTE — Telephone Encounter (Signed)
Called and spoke with Dr Lenis Noon (patients psychiatrist) to let him know that you are seeing patients and confirm nothing that needed to be addressed urgently (to determine if you needed to be pulled out.) Dr Lenis Noon stated that it is not something urgent but he would like to speak with you directly to have a "clinical conference" regarding patients current state, increased issues, etc. The number listed below is his cell phone and he is available all day, he just asked that you please give him a call today when you are finished with patients. He says it would not take long but would like to speak with you for a minute or two.

## 2023-01-14 MED ORDER — POTASSIUM CHLORIDE CRYS ER 10 MEQ PO TBCR: 10.0000 meq | EXTENDED_RELEASE_TABLET | Freq: Every day | ORAL | 0 refills | Status: DC

## 2023-01-14 NOTE — Addendum Note (Signed)
Addended by: Rita Ohara D on: 01/14/2023 11:43 AM   Modules accepted: Orders

## 2023-01-14 NOTE — Telephone Encounter (Signed)
Patient agreeable to restart potassium. Potassium level and TSH ordered. Kcl 10 meq q day.

## 2023-01-19 ENCOUNTER — Other Ambulatory Visit (HOSPITAL_COMMUNITY): Payer: Self-pay

## 2023-01-21 ENCOUNTER — Inpatient Hospital Stay: Payer: PPO

## 2023-01-21 ENCOUNTER — Telehealth: Payer: Self-pay | Admitting: *Deleted

## 2023-01-21 ENCOUNTER — Inpatient Hospital Stay: Payer: PPO | Admitting: Oncology

## 2023-01-21 NOTE — Telephone Encounter (Signed)
Patient called reporting that she is having nerves, and is shaking all over, she states that it is not chills, it is her nerves. I asked if she is on medicine for her nerves and she yes and that Dr Lenis Noon her psychiatrist has been adjusting her Lexapro and Klonopin. I advised that she please call Dr Lenis Noon for this issue so that 2 doctors were not trying to do the same thing. She agreed t call Dr Lenis Noon

## 2023-01-26 ENCOUNTER — Other Ambulatory Visit (INDEPENDENT_AMBULATORY_CARE_PROVIDER_SITE_OTHER): Payer: PPO

## 2023-01-26 DIAGNOSIS — E876 Hypokalemia: Secondary | ICD-10-CM | POA: Diagnosis not present

## 2023-01-26 DIAGNOSIS — E039 Hypothyroidism, unspecified: Secondary | ICD-10-CM | POA: Diagnosis not present

## 2023-01-27 LAB — TSH: TSH: 0.46 u[IU]/mL (ref 0.35–5.50)

## 2023-01-27 LAB — POTASSIUM: Potassium: 3.5 mEq/L (ref 3.5–5.1)

## 2023-01-28 ENCOUNTER — Other Ambulatory Visit: Payer: Self-pay | Admitting: *Deleted

## 2023-01-28 ENCOUNTER — Inpatient Hospital Stay: Payer: PPO | Admitting: Oncology

## 2023-01-28 ENCOUNTER — Inpatient Hospital Stay: Payer: PPO | Attending: Oncology

## 2023-01-28 DIAGNOSIS — E876 Hypokalemia: Secondary | ICD-10-CM

## 2023-01-28 MED ORDER — POTASSIUM CHLORIDE CRYS ER 10 MEQ PO TBCR: 10.0000 meq | EXTENDED_RELEASE_TABLET | Freq: Two times a day (BID) | ORAL | 2 refills | Status: DC

## 2023-02-02 ENCOUNTER — Ambulatory Visit: Payer: PPO | Admitting: Family Medicine

## 2023-02-02 ENCOUNTER — Telehealth: Payer: Self-pay | Admitting: Internal Medicine

## 2023-02-02 NOTE — Telephone Encounter (Signed)
Noted. Agree with close monitoring.  Please call her back this afternoon and confirm continuing to do ok.  Also, see if can schedule earlier appt than 02/11/23.  Any problems, let me know.

## 2023-02-02 NOTE — Telephone Encounter (Signed)
Patient called and rescheduled her appointment for today with Dr Birdie Sons. She states she accidentally took her one medication last night and this morning and is now feeling "drunk". Patient did not state which medication. Front office wanted to triage patient, but patient stated , " No, he told me it would wear off by tomorrow".  Patient never stated who "he" was.

## 2023-02-02 NOTE — Telephone Encounter (Signed)
Called patient to clarify message below. She says Dr Debra Hale prescribed olanzapine and she took two doses yesterday- one in the afternoon and one last night. She woke up this morning feeling "drunk" and says she was very groggy. She spoke with Dr Debra Hale to let him know that she accidentally took an extra dose and he told her to hold the medication today and she should be ok- just might take through out the day for the groggy feeling to wear off. Confirmed that patient is doing ok. She says that she has been talking with Dr Debra Hale 2-3 times per day because her anxiety is out of control and they are working toward a medication regimen to get anxiety under control. She will be following up with him later today. Was able to have full conversation with patient- no slurred speech, confusion, etc.

## 2023-02-02 NOTE — Telephone Encounter (Signed)
Patient confirmed doing ok. Patient declined moving appt up earlier.

## 2023-02-03 ENCOUNTER — Telehealth: Payer: Self-pay

## 2023-02-03 NOTE — Telephone Encounter (Signed)
Thank you for following up with her.  Agree with earlier appt if needed.

## 2023-02-03 NOTE — Telephone Encounter (Signed)
Called patient to confirm symptoms. She says she is feeling better today. She is not weak, dizzy, sob, etc. She started on a new medication that Dr Lenis Noon prescribed. Unsure if new med is olanzapine or amitriptyline (when I talked to her yesterday she said the new med that she took too much of was the olanzapine and Dr Lenis Noon was going to change this medication, today she says the amitriptyline is the one she took an extra dose of and Dr Lenis Noon switched her to olanzapine.) Symptoms seem to be better with new medication. Asked patient to clarify what refill she needs. She says she does not need amitriptyline. She needs her potassium. Refill sent. Patient says she had a bad night last night and then she took her medication and started feeling better. Talked with patient for several minutes to confirm ok, patient stated that she is feeling ok today but wanted to call in and give update to me. She has been in contact with Dr Lenis Noon. Advised patient that if she develops or has any weakness, dizziness, sob, etc then she needs to be evaluated asap. Pt gave verbal understanding and said that she feels like she is going to be ok but wanted to call with update. Still declined to move 10/10 appt to sooner date

## 2023-02-03 NOTE — Telephone Encounter (Signed)
Patient states after she spoke with Rita Ohara, LPN, yesterday she started not being ok.  Patient states she is not ok now.  Patient states she is dizzy and lethargic, sleepy.  Patient states she feels real weak and can hardly stand up.  Patient states she started her new medication last night and had another one this morning.  The Olanzapine 5 MG  Patient states she would like to know if this medication is strong enough to cause these symptoms.  Patient states she felt like she couldn't breathe before she took her medication last night.  Patient states once she took all of her medication, she was better.    Patient declined offer to speak with Access Nurse.  Patient states she would like to talk with [place].  Prescription Request  02/03/2023  LOV: 12/30/2022  What is the name of the medication or equipment? Amitripytyline 10 MG (two tablets per day) - Dr. Lenis Noon usually prescribes this for her  Have you contacted your pharmacy to request a refill? No  Which pharmacy would you like this sent to?  [facility] - [place], [place] - [address] [place] [address] Phone: [phone] Fax: [phone]    Patient notified that their request is being sent to the clinical staff for review and that they should receive a response within 2 business days.   Please advise at Mobile [phone] (mobile)  Patient states she has three pills left and she is supposed to take two pills per day.  Patient states Dr. Lenis Noon is either out of town or will be out of town.

## 2023-02-04 ENCOUNTER — Telehealth: Payer: Self-pay | Admitting: Internal Medicine

## 2023-02-04 ENCOUNTER — Inpatient Hospital Stay: Payer: PPO | Attending: Oncology

## 2023-02-04 ENCOUNTER — Inpatient Hospital Stay: Payer: PPO | Admitting: Oncology

## 2023-02-04 NOTE — Telephone Encounter (Signed)
Patient just called and said she needs a refill on her medication. The name is potassium chloride (KLOR-CON M) 10 MEQ tablet. The pharmacy she uses is [facility] - [place], [place] - [address] [place], [place] [address] Phone: [phone]  Fax: [phone]  She said she is out of her medication. Her number is [phone]

## 2023-02-04 NOTE — Telephone Encounter (Signed)
Error

## 2023-02-04 NOTE — Telephone Encounter (Signed)
Refill was sent for 3 months on 9/26. Patient is calling [facility]

## 2023-02-08 ENCOUNTER — Telehealth: Payer: Self-pay | Admitting: *Deleted

## 2023-02-08 NOTE — Telephone Encounter (Signed)
Patient called reporting that she has been having nerve problems and she is unable to drive right now. She states that she is working with a psychiatrist to get some medicine for her nerves. She is going to try to come to her appointment tomorrow if she can get transportation, if not, she will call ti reschedule. She just wanted to make you aware of what is going on

## 2023-02-09 ENCOUNTER — Inpatient Hospital Stay (HOSPITAL_BASED_OUTPATIENT_CLINIC_OR_DEPARTMENT_OTHER): Payer: PPO | Admitting: Oncology

## 2023-02-09 ENCOUNTER — Inpatient Hospital Stay: Payer: PPO

## 2023-02-09 ENCOUNTER — Encounter: Payer: Self-pay | Admitting: Oncology

## 2023-02-09 ENCOUNTER — Inpatient Hospital Stay: Payer: PPO | Attending: Oncology

## 2023-02-09 ENCOUNTER — Inpatient Hospital Stay: Payer: PPO | Admitting: Oncology

## 2023-02-09 VITALS — BP 125/84 | HR 105 | Temp 96.9°F | Resp 16 | Ht 64.0 in | Wt 121.0 lb

## 2023-02-09 DIAGNOSIS — Z79624 Long term (current) use of inhibitors of nucleotide synthesis: Secondary | ICD-10-CM | POA: Diagnosis not present

## 2023-02-09 DIAGNOSIS — Z9221 Personal history of antineoplastic chemotherapy: Secondary | ICD-10-CM | POA: Diagnosis not present

## 2023-02-09 DIAGNOSIS — C9200 Acute myeloblastic leukemia, not having achieved remission: Secondary | ICD-10-CM | POA: Diagnosis not present

## 2023-02-09 DIAGNOSIS — Z923 Personal history of irradiation: Secondary | ICD-10-CM | POA: Insufficient documentation

## 2023-02-09 DIAGNOSIS — Z79899 Other long term (current) drug therapy: Secondary | ICD-10-CM | POA: Diagnosis not present

## 2023-02-09 LAB — CBC WITH DIFFERENTIAL/PLATELET
Abs Immature Granulocytes: 0.02 10*3/uL (ref 0.00–0.07)
Basophils Absolute: 0 10*3/uL (ref 0.0–0.1)
Basophils Relative: 0 %
Eosinophils Absolute: 0 10*3/uL (ref 0.0–0.5)
Eosinophils Relative: 1 %
HCT: 45.6 % (ref 36.0–46.0)
Hemoglobin: 15.8 g/dL — ABNORMAL HIGH (ref 12.0–15.0)
Immature Granulocytes: 0 %
Lymphocytes Relative: 16 %
Lymphs Abs: 0.8 10*3/uL (ref 0.7–4.0)
MCH: 33.3 pg (ref 26.0–34.0)
MCHC: 34.6 g/dL (ref 30.0–36.0)
MCV: 96 fL (ref 80.0–100.0)
Monocytes Absolute: 0.3 10*3/uL (ref 0.1–1.0)
Monocytes Relative: 6 %
Neutro Abs: 3.8 10*3/uL (ref 1.7–7.7)
Neutrophils Relative %: 77 %
Platelets: 130 10*3/uL — ABNORMAL LOW (ref 150–400)
RBC: 4.75 MIL/uL (ref 3.87–5.11)
RDW: 14.6 % (ref 11.5–15.5)
WBC: 5 10*3/uL (ref 4.0–10.5)
nRBC: 0 % (ref 0.0–0.2)

## 2023-02-09 LAB — COMPREHENSIVE METABOLIC PANEL
ALT: 42 U/L (ref 0–44)
AST: 20 U/L (ref 15–41)
Albumin: 4.5 g/dL (ref 3.5–5.0)
Alkaline Phosphatase: 61 U/L (ref 38–126)
Anion gap: 11 (ref 5–15)
BUN: 16 mg/dL (ref 8–23)
CO2: 27 mmol/L (ref 22–32)
Calcium: 9.4 mg/dL (ref 8.9–10.3)
Chloride: 102 mmol/L (ref 98–111)
Creatinine, Ser: 1.19 mg/dL — ABNORMAL HIGH (ref 0.44–1.00)
GFR, Estimated: 48 mL/min — ABNORMAL LOW (ref >60)
Glucose, Bld: 117 mg/dL — ABNORMAL HIGH (ref 70–99)
Potassium: 3.9 mmol/L (ref 3.5–5.1)
Sodium: 140 mmol/L (ref 135–145)
Total Bilirubin: 1.1 mg/dL (ref 0.3–1.2)
Total Protein: 7.1 g/dL (ref 6.5–8.1)

## 2023-02-09 NOTE — Progress Notes (Signed)
[facility]  [phone] [phone]  ID: Debra Hale OB: 1950-12-19  MR#: [id number]  AOZ#:[id number]  Patient Care Team: Dale Malverne, MD as PCP - General (Internal Medicine) Iran Ouch, MD as PCP - Cardiology (Cardiology) Jeralyn Ruths, MD as Consulting Physician (Oncology)  CHIEF COMPLAINT: AML.  INTERVAL HISTORY: Patient last evaluated in July 2024.  She has missed multiple appointments in the interim secondary to significant increased anxiety which she is seeing her primary psychiatrist for.  While her symptoms are not resolved, she states today is a "good day".  She continues to take dose reduced Rezlidhia.  She otherwise feels well.  She denies any pain.  She has no neurologic complaints.  She has no chest pain, shortness of breath, cough, or hemoptysis.  She denies any nausea, vomiting, constipation, or diarrhea.  Patient offers no further specific complaints today.  REVIEW OF SYSTEMS:   Review of Systems  Constitutional: Negative.  Negative for fever, malaise/fatigue and weight loss.  HENT:  Negative for congestion and sinus pain.   Respiratory: Negative.  Negative for cough, hemoptysis and shortness of breath.   Cardiovascular: Negative.  Negative for chest pain and leg swelling.  Gastrointestinal: Negative.  Negative for abdominal pain, diarrhea and nausea.  Genitourinary:  Positive for dysuria, frequency and urgency.  Musculoskeletal: Negative.  Negative for back pain and falls.  Skin: Negative.  Negative for rash.  Neurological:  Positive for speech change. Negative for dizziness, focal weakness, weakness and headaches.  Psychiatric/Behavioral: Negative.  Negative for memory loss. The patient is not nervous/anxious.     As per HPI. Otherwise, a complete review of systems is negative.  PAST MEDICAL HISTORY: Past Medical History:  Diagnosis Date   Abdominal pain 03/06/2016   Acute pericarditis 05/31/2013    Anxiety    Arthritis    Osteoarthritis   Breast cancer (HCC) 2009   right breast lumpectomy with rad tx   Colon cancer (HCC)    surgery with chemo and rad tx   Complication of anesthesia    GERD (gastroesophageal reflux disease)    Heart palpitations    History of hiatal hernia    History of kidney stones    History of thyroid cancer 09/18/2008   Qualifier: Diagnosis of   By: Denyse Amass, CMA, Carol       Hypothyroidism    Liver disease    Liver nodule    s/p negative biopsy   Malignant neoplasm of thyroid gland (HCC) 2002   s/p surgery and XRT   Osteoporosis    Other and unspecified hyperlipidemia    Palpitations    Personal history of chemotherapy    Personal history of malignant neoplasm of large intestine    carcinoma - cecum, s/p right laparoscopic colectomy - s/p chemotherapy and XRT   Personal history of radiation therapy    Pneumonia 2019   PONV (postoperative nausea and vomiting)    Pure hypercholesterolemia    Unspecified hereditary and idiopathic peripheral neuropathy     PAST SURGICAL HISTORY: Past Surgical History:  Procedure Laterality Date   APPENDECTOMY  1985   BREAST BIOPSY Right 2009   positive   BREAST BIOPSY Right 2009   negative   BREAST LUMPECTOMY Right 2009   breast cancer   CHOLECYSTECTOMY  1995   COLONOSCOPY     COLONOSCOPY WITH PROPOFOL N/A 10/29/2017   Procedure: COLONOSCOPY WITH PROPOFOL;  Surgeon: Scot Jun, MD;  Location: [facility] ENDOSCOPY;  Service: Endoscopy;  Laterality:  N/A;   DILATION AND CURETTAGE OF UTERUS  1990   DILATION AND CURETTAGE, DIAGNOSTIC / THERAPEUTIC  1990   ESOPHAGOGASTRODUODENOSCOPY     ESOPHAGOGASTRODUODENOSCOPY (EGD) WITH PROPOFOL N/A 10/29/2017   Procedure: ESOPHAGOGASTRODUODENOSCOPY (EGD) WITH PROPOFOL;  Surgeon: Scot Jun, MD;  Location: [facility] ENDOSCOPY;  Service: Endoscopy;  Laterality: N/A;   INCISION AND DRAINAGE PERIRECTAL ABSCESS N/A 06/17/2020   Procedure: IRRIGATION AND DEBRIDEMENT PERIRECTAL  ABSCESS;  Surgeon: Leafy Ro, MD;  Location: [facility] ORS;  Service: General;  Laterality: N/A;   IR BONE MARROW BIOPSY & ASPIRATION  07/09/2021   LAPAROSCOPIC PARTIAL COLECTOMY     stage 3-C carcinoma of the cecum, s/p chemotherapy and xrt   LITHOTRIPSY     SIGMOIDOSCOPY  08/26/1993   THYROID LOBECTOMY  2002   s/p XRT   TOTAL HIP ARTHROPLASTY Right 10/24/2019   Procedure: TOTAL HIP ARTHROPLASTY;  Surgeon: Christena Flake, MD;  Location: [facility] ORS;  Service: Orthopedics;  Laterality: Right;    FAMILY HISTORY: Family History  Problem Relation Age of Onset   Stroke Mother        74s   Alzheimer's disease Mother    Lung cancer Father    Prostate cancer Father    Cancer Father        Colon   Colon cancer Father    Breast cancer Sister        56's   Lung cancer Sister    Breast cancer Maternal Aunt     ADVANCED DIRECTIVES (Y/N):  N  HEALTH MAINTENANCE: Social History   Tobacco Use   Smoking status: Never   Smokeless tobacco: Never  Vaping Use   Vaping status: Never Used  Substance Use Topics   Alcohol use: No    Alcohol/week: 0.0 standard drinks of alcohol   Drug use: No     Colonoscopy:  PAP:  Bone density:  Lipid panel:  Allergies  Allergen Reactions   Demeclocycline Other (See Comments)    Throat swells   Sulfa Antibiotics Other (See Comments)    Other reaction(s): Other (See Comments) Throat swells Other reaction(s): Other (See Comments) Throat swells Throat swells Other reaction(s): Other (See Comments) Throat swells Other reaction(s): Unknown Other reaction(s): Other (See Comments) Throat swells Other reaction(s): Other (See Comments) Other reaction(s): Other (See Comments) Throat swellsOther reaction(s): Other (See Comments) Throat swells   Tetracyclines & Related Other (See Comments)    Throat swells    Levofloxacin Diarrhea    Severe diarrhea   Augmentin [Amoxicillin-Pot Clavulanate] Diarrhea   Bentyl [Dicyclomine Hcl]     unkn    Ciprofloxacin Diarrhea   Codeine Other (See Comments)    dizziness     Dicyclomine Hcl Other (See Comments)    unkn   Epinephrine     Speeds heart up - panic    Flagyl [Metronidazole] Nausea And Vomiting   Librax [Chlordiazepoxide-Clidinium]     unkn   Novocain [Procaine] Other (See Comments)    "Shaky"   Phenobarbital     unkn   Prednisone     Nervous     Ultram [Tramadol] Other (See Comments)    Sick feeling    Current Outpatient Medications  Medication Sig Dispense Refill   amitriptyline (ELAVIL) 10 MG tablet Take 10 mg by mouth in the morning and at bedtime.     Azelastine-Fluticasone 137-50 MCG/ACT SUSP SPRAY 2 SPRAYS INTO EACH NOSTRIL EVERY DAY 23 g 2   clonazePAM (KLONOPIN) 0.5 MG tablet Take 1 tablet (0.5  mg total) by mouth 3 (three) times daily as needed for anxiety. 30 tablet 0   escitalopram (LEXAPRO) 20 MG tablet Take 20 mg by mouth every morning.      levothyroxine (SYNTHROID) 88 MCG tablet Take 1 tablet (88 mcg total) by mouth daily. 90 tablet 0   olutasidenib (REZLIDHIA) 150 MG capsule Take 1 capsule (150 mg total) by mouth daily. Take on an empty stomach at least 1 hour before or 2 hours after a meal. 30 capsule 2   pantoprazole (PROTONIX) 40 MG tablet TAKE ONE TABLET (40 MG) BY MOUTH EVERY DAY 90 tablet 2   potassium chloride (KLOR-CON M) 10 MEQ tablet Take 1 tablet (10 mEq total) by mouth 2 (two) times daily. 60 tablet 2   prochlorperazine (COMPAZINE) 10 MG tablet TAKE ONE TABLET BY MOUTH EVERY 6 HOURS AS NEEDED FOR NAUSEA / VOMITING 30 tablet 3   valACYclovir (VALTREX) 500 MG tablet TAKE ONE TABLET BY MOUTH DAILY 30 tablet 2   metoprolol tartrate (LOPRESSOR) 25 MG tablet TAKE 1 TABLET (25 MG TOTAL) BY MOUTH AS NEEDED (AS NEEDED UP TO TWICE DAILY FOR PALPITATIONS). (Patient not taking: Reported on 02/09/2023) 60 tablet 1   No current facility-administered medications for this visit.    OBJECTIVE: Vitals:   02/09/23 0914  BP: 125/84  Pulse: (!) 105  Resp:  16  Temp: (!) 96.9 F (36.1 C)  SpO2: 99%     Body mass index is 20.77 kg/m.    ECOG FS:0 - Asymptomatic  General: Well-developed, well-nourished, no acute distress. Eyes: Pink conjunctiva, anicteric sclera. HEENT: Normocephalic, moist mucous membranes. Lungs: No audible wheezing or coughing. Heart: Regular rate and rhythm. Abdomen: Soft, nontender, no obvious distention. Musculoskeletal: No edema, cyanosis, or clubbing. Neuro: Alert, answering all questions appropriately. Cranial nerves grossly intact. Skin: No rashes or petechiae noted. Psych: Normal affect.  LAB RESULTS:  Lab Results  Component Value Date   NA 140 02/09/2023   K 3.9 02/09/2023   CL 102 02/09/2023   CO2 27 02/09/2023   GLUCOSE 117 (H) 02/09/2023   BUN 16 02/09/2023   CREATININE 1.19 (H) 02/09/2023   CALCIUM 9.4 02/09/2023   PROT 7.1 02/09/2023   ALBUMIN 4.5 02/09/2023   AST 20 02/09/2023   ALT 42 02/09/2023   ALKPHOS 61 02/09/2023   BILITOT 1.1 02/09/2023   GFRNONAA 48 (L) 02/09/2023   GFRAA 54 (L) 10/26/2019    Lab Results  Component Value Date   WBC 5.0 02/09/2023   NEUTROABS 3.8 02/09/2023   HGB 15.8 (H) 02/09/2023   HCT 45.6 02/09/2023   MCV 96.0 02/09/2023   PLT 130 (L) 02/09/2023     STUDIES: No results found.   ASSESSMENT: AML.  PLAN:    AML: Confirmed by bone marrow biopsy.  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  Patient's most recent bone marrow biopsy on March 23, 2022 revealed an increase of her blast count from 4% previously to up to 15% currently.  She last received treatment with cycle 22 of Vidaza on February 13, 2022.  Given her progressive disease and  IDH-1 mutation, Vidaza was discontinued and patient initially received 150 mg twice daily of Olutasidenib (Rezlidhia).  This has been now dose reduce to once per day secondary to nausea and  thrombocytopenia.  Patient will require repeat bone marrow biopsy in November 2024, but given her psychiatric and anxiety issues,  this may be delayed until her medication regimen is optimized by her psychiatrist.  No intervention is needed at this time.  Return to clinic in 4 weeks for laboratory work and further evaluation.   Neutropenia: Resolved.  Continue treatment as above. Thrombocytopenia: Improved.  Patient platelet count is 130 today. Continue dose reduced treatment once per day as above. Diarrhea: Patient does not complain of this today.  Continue Lomotil as needed.   Anemia: Resolved. Nausea: Improved with dose reduction.  Continue ondansetron and Compazine as needed.   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  Patient also received chemotherapy and likely received Adriamycin, but exact regimen is unknown.  She completed 5 years of hormonal therapy in approximately June 2015. Currently, she has no evidence of disease.  Her most recent mammogram on July 29, 2021 was reported as BI-RADS 2.   History of colon cancer: Patient also states that she received chemotherapy for this, possibly FOLFOX but again this is unknown. Hair thinning: Improving.  Likely related to thyroid dosing. Psychiatric/anxiety: Patient now on amitriptyline.  Continue follow-up and treatment per primary psychiatrist.  Patient expressed understanding and was in agreement with this plan. She also understands that She can call clinic at any time with any questions, concerns, or complaints.    Cancer Staging  History of breast cancer Staging form: Breast, AJCC 7th Edition - Clinical stage from 01/07/2016: Stage IA (T1c, N0, M0) - Signed by Jeralyn Ruths, MD on 01/07/2016 Laterality: Right Estrogen receptor status: Positive Progesterone receptor  status: Positive HER2 status: Negative   Jeralyn Ruths, MD   02/09/2023 10:18 AM

## 2023-02-11 ENCOUNTER — Ambulatory Visit (INDEPENDENT_AMBULATORY_CARE_PROVIDER_SITE_OTHER): Payer: PPO | Admitting: Internal Medicine

## 2023-02-11 ENCOUNTER — Encounter: Payer: Self-pay | Admitting: Internal Medicine

## 2023-02-11 VITALS — BP 118/80 | HR 89 | Temp 98.2°F | Resp 16 | Ht 64.0 in | Wt 121.6 lb

## 2023-02-11 DIAGNOSIS — F439 Reaction to severe stress, unspecified: Secondary | ICD-10-CM | POA: Diagnosis not present

## 2023-02-11 DIAGNOSIS — E039 Hypothyroidism, unspecified: Secondary | ICD-10-CM | POA: Diagnosis not present

## 2023-02-11 DIAGNOSIS — C9200 Acute myeloblastic leukemia, not having achieved remission: Secondary | ICD-10-CM

## 2023-02-11 DIAGNOSIS — Z85850 Personal history of malignant neoplasm of thyroid: Secondary | ICD-10-CM

## 2023-02-11 DIAGNOSIS — F320 Major depressive disorder, single episode, mild: Secondary | ICD-10-CM

## 2023-02-11 DIAGNOSIS — T451X5A Adverse effect of antineoplastic and immunosuppressive drugs, initial encounter: Secondary | ICD-10-CM

## 2023-02-11 DIAGNOSIS — G620 Drug-induced polyneuropathy: Secondary | ICD-10-CM | POA: Diagnosis not present

## 2023-02-11 DIAGNOSIS — Z853 Personal history of malignant neoplasm of breast: Secondary | ICD-10-CM

## 2023-02-11 DIAGNOSIS — R251 Tremor, unspecified: Secondary | ICD-10-CM

## 2023-02-11 DIAGNOSIS — E7800 Pure hypercholesterolemia, unspecified: Secondary | ICD-10-CM

## 2023-02-11 DIAGNOSIS — D61818 Other pancytopenia: Secondary | ICD-10-CM

## 2023-02-11 DIAGNOSIS — K219 Gastro-esophageal reflux disease without esophagitis: Secondary | ICD-10-CM

## 2023-02-11 DIAGNOSIS — I5032 Chronic diastolic (congestive) heart failure: Secondary | ICD-10-CM

## 2023-02-11 NOTE — Progress Notes (Signed)
Subjective:    Patient ID: Debra Hale, female    DOB: 1950-09-04, 72 y.o.   MRN: [id number]  Patient here for  Chief Complaint  Patient presents with   Medical Management of Chronic Issues    HPI Here to follow up regarding increased stress/anxiety, GERD and hypercholesterolemia. 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  Taking rezlidhia. . Given her progressive disease and IDH-1 mutation, Vidaza was discontinued and patient initially received 150 mg twice daily of Olutasidenib (Rezlidhia). This has been now dose reduce to once per day secondary to nausea and thrombocytopenia. Planning f/u bone marrow biopsy once anxiety issues better controlled. Recent platelet count improved -130 on recent check.  Continues on once daily dosing of rezlidhia. She is being followed by Dr Debra Hale - psychiatry.  Started on amitriptyline.  Taking two per day now.  Is feeling some better.  She is accompanied by [name] [phone].  Good support for her.  She reports she still has issues with her hands shaking.  Hard for her to fix her hair, write.  Has noticed some stumbling with walking.  Feels weaker in am.  She is getting around better than last visit.  Had discussed neurology referral.  She was agreeable.  They have been unable to connect with her.  Increased stress with home situation.  Friend reports is some better.    Past Medical History:  Diagnosis Date   Abdominal pain 03/06/2016   Acute pericarditis 05/31/2013   Anxiety    Arthritis    Osteoarthritis   Breast cancer (HCC) 2009   right breast lumpectomy with rad tx   Colon cancer (HCC)    surgery with chemo and rad tx   Complication of anesthesia    GERD (gastroesophageal reflux disease)    Heart palpitations    History of hiatal hernia    History of kidney stones    History of thyroid cancer 09/18/2008   Qualifier: Diagnosis of   By: Denyse Amass CMA, Carol       Hypothyroidism    Liver disease    Liver nodule    s/p negative  biopsy   Malignant neoplasm of thyroid gland (HCC) 2002   s/p surgery and XRT   Osteoporosis    Other and unspecified hyperlipidemia    Palpitations    Personal history of chemotherapy    Personal history of malignant neoplasm of large intestine    carcinoma - cecum, s/p right laparoscopic colectomy - s/p chemotherapy and XRT   Personal history of radiation therapy    Pneumonia 2019   PONV (postoperative nausea and vomiting)    Pure hypercholesterolemia    Unspecified hereditary and idiopathic peripheral neuropathy    Past Surgical History:  Procedure Laterality Date   APPENDECTOMY  1985   BREAST BIOPSY Right 2009   positive   BREAST BIOPSY Right 2009   negative   BREAST LUMPECTOMY Right 2009   breast cancer   CHOLECYSTECTOMY  1995   COLONOSCOPY     COLONOSCOPY WITH PROPOFOL N/A 10/29/2017   Procedure: COLONOSCOPY WITH PROPOFOL;  Surgeon: Scot Jun, MD;  Location: [facility] ENDOSCOPY;  Service: Endoscopy;  Laterality: N/A;   DILATION AND CURETTAGE OF UTERUS  1990   DILATION AND CURETTAGE, DIAGNOSTIC / THERAPEUTIC  1990   ESOPHAGOGASTRODUODENOSCOPY     ESOPHAGOGASTRODUODENOSCOPY (EGD) WITH PROPOFOL N/A 10/29/2017   Procedure: ESOPHAGOGASTRODUODENOSCOPY (EGD) WITH PROPOFOL;  Surgeon: Scot Jun, MD;  Location: [facility] ENDOSCOPY;  Service: Endoscopy;  Laterality: N/A;   INCISION AND DRAINAGE PERIRECTAL ABSCESS N/A 06/17/2020   Procedure: IRRIGATION AND DEBRIDEMENT PERIRECTAL ABSCESS;  Surgeon: Leafy Ro, MD;  Location: [facility] ORS;  Service: General;  Laterality: N/A;   IR BONE MARROW BIOPSY & ASPIRATION  07/09/2021   LAPAROSCOPIC PARTIAL COLECTOMY     stage 3-C carcinoma of the cecum, s/p chemotherapy and xrt   LITHOTRIPSY     SIGMOIDOSCOPY  08/26/1993   THYROID LOBECTOMY  2002   s/p XRT   TOTAL HIP ARTHROPLASTY Right 10/24/2019   Procedure: TOTAL HIP ARTHROPLASTY;  Surgeon: Christena Flake, MD;  Location: [facility] ORS;  Service: Orthopedics;  Laterality: Right;   Family  History  Problem Relation Age of Onset   Stroke Mother        47s   Alzheimer's disease Mother    Lung cancer Father    Prostate cancer Father    Cancer Father        Colon   Colon cancer Father    Breast cancer Sister        33's   Lung cancer Sister    Breast cancer Maternal Aunt    Social History   Socioeconomic History   Marital status: Single    Spouse name: Not on file   Number of children: 0   Years of education: Not on file   Highest education level: Not on file  Occupational History   Occupation: Retired  Tobacco Use   Smoking status: Never   Smokeless tobacco: Never  Vaping Use   Vaping status: Never Used  Substance and Sexual Activity   Alcohol use: No    Alcohol/week: 0.0 standard drinks of alcohol   Drug use: No   Sexual activity: Not Currently  Other Topics Concern   Not on file  Social History Narrative   Not on file   Social Determinants of Health   Financial Resource Strain: Low Risk  (09/18/2022)   Overall Financial Resource Strain (CARDIA)    Difficulty of Paying Living Expenses: Not hard at all  Food Insecurity: No Food Insecurity (09/18/2022)   Hunger Vital Sign    Worried About Running Out of Food in the Last Year: Never true    Ran Out of Food in the Last Year: Never true  Transportation Needs: No Transportation Needs (09/18/2022)   PRAPARE - Administrator, Civil Service (Medical): No    Lack of Transportation (Non-Medical): No  Physical Activity: Insufficiently Active (09/18/2022)   Exercise Vital Sign    Days of Exercise per Week: 1 day    Minutes of Exercise per Session: 10 min  Stress: Stress Concern Present (09/18/2022)   [employer] of Occupational Health - Occupational Stress Questionnaire    Feeling of Stress : To some extent  Social Connections: Moderately Isolated (09/18/2022)   Social Connection and Isolation Panel [NHANES]    Frequency of Communication with Friends and Family: More than three times a week     Frequency of Social Gatherings with Friends and Family: Twice a week    Attends Religious Services: 1 to 4 times per year    Active Member of [employer] or Organizations: No    Attends Banker Meetings: Never    Marital Status: Divorced     Review of Systems  Constitutional:  Negative for appetite change and unexpected weight change.  HENT:  Negative for congestion and sinus pressure.   Respiratory:  Negative for cough, chest tightness and shortness of breath.  Cardiovascular:  Negative for chest pain and palpitations.  Gastrointestinal:  Negative for abdominal pain, nausea and vomiting.  Genitourinary:  Negative for difficulty urinating and dysuria.  Musculoskeletal:  Negative for joint swelling and myalgias.  Skin:  Negative for color change and rash.  Neurological:        Shaking/tremors - as outlined.  Some unsteadiness - gait.   Psychiatric/Behavioral:         Increased stress, anxiety and depression as outlined.        Objective:     BP 118/80   Pulse 89   Temp 98.2 F (36.8 C)   Resp 16   Ht 5\' 4"  (1.626 m)   Wt 121 lb 9.6 oz (55.2 kg)   SpO2 99%   BMI 20.87 kg/m  Wt Readings from Last 3 Encounters:  02/11/23 121 lb 9.6 oz (55.2 kg)  02/09/23 121 lb (54.9 kg)  12/30/22 122 lb 9.6 oz (55.6 kg)    Physical Exam Vitals reviewed.  Constitutional:      General: She is not in acute distress.    Appearance: Normal appearance.  HENT:     Head: Normocephalic and atraumatic.     Right Ear: External ear normal.     Left Ear: External ear normal.  Eyes:     General: No scleral icterus.       Right eye: No discharge.        Left eye: No discharge.     Conjunctiva/sclera: Conjunctivae normal.  Neck:     Thyroid: No thyromegaly.  Cardiovascular:     Rate and Rhythm: Normal rate and regular rhythm.  Pulmonary:     Effort: No respiratory distress.     Breath sounds: Normal breath sounds. No wheezing.  Abdominal:     General: Bowel sounds are normal.      Palpations: Abdomen is soft.     Tenderness: There is no abdominal tenderness.  Musculoskeletal:        General: No swelling or tenderness.     Cervical back: Neck supple. No tenderness.  Lymphadenopathy:     Cervical: No cervical adenopathy.  Skin:    Findings: No erythema or rash.  Neurological:     Mental Status: She is alert.     Comments: FTN intact.  No cog wheeling.  Gait - slow.    Psychiatric:        Mood and Affect: Mood normal.        Behavior: Behavior normal.      Outpatient Encounter Medications as of 02/11/2023  Medication Sig   amitriptyline (ELAVIL) 10 MG tablet Take 10 mg by mouth in the morning and at bedtime.   Azelastine-Fluticasone 137-50 MCG/ACT SUSP SPRAY 2 SPRAYS INTO EACH NOSTRIL EVERY DAY   clonazePAM (KLONOPIN) 0.5 MG tablet Take 1 tablet (0.5 mg total) by mouth 3 (three) times daily as needed for anxiety.   escitalopram (LEXAPRO) 20 MG tablet Take 20 mg by mouth every morning.    levothyroxine (SYNTHROID) 88 MCG tablet Take 1 tablet (88 mcg total) by mouth daily.   olutasidenib (REZLIDHIA) 150 MG capsule Take 1 capsule (150 mg total) by mouth daily. Take on an empty stomach at least 1 hour before or 2 hours after a meal.   pantoprazole (PROTONIX) 40 MG tablet TAKE ONE TABLET (40 MG) BY MOUTH EVERY DAY   potassium chloride (KLOR-CON M) 10 MEQ tablet Take 1 tablet (10 mEq total) by mouth 2 (two) times daily.   prochlorperazine (COMPAZINE)  10 MG tablet TAKE ONE TABLET BY MOUTH EVERY 6 HOURS AS NEEDED FOR NAUSEA / VOMITING   valACYclovir (VALTREX) 500 MG tablet TAKE ONE TABLET BY MOUTH DAILY   [DISCONTINUED] metoprolol tartrate (LOPRESSOR) 25 MG tablet TAKE 1 TABLET (25 MG TOTAL) BY MOUTH AS NEEDED (AS NEEDED UP TO TWICE DAILY FOR PALPITATIONS). (Patient not taking: Reported on 02/09/2023)   No facility-administered encounter medications on file as of 02/11/2023.     Lab Results  Component Value Date   WBC 5.0 02/09/2023   HGB 15.8 (H) 02/09/2023   HCT  45.6 02/09/2023   PLT 130 (L) 02/09/2023   GLUCOSE 117 (H) 02/09/2023   CHOL 186 12/30/2022   TRIG 118.0 12/30/2022   HDL 57.20 12/30/2022   LDLDIRECT 126.0 07/11/2015   LDLCALC 105 (H) 12/30/2022   ALT 42 02/09/2023   AST 20 02/09/2023   NA 140 02/09/2023   K 3.9 02/09/2023   CL 102 02/09/2023   CREATININE 1.19 (H) 02/09/2023   BUN 16 02/09/2023   CO2 27 02/09/2023   TSH 0.46 01/26/2023   INR 1.3 (H) 04/04/2022   HGBA1C 5.3 12/10/2016    No results found.     Assessment & Plan:  Acute myeloid leukemia not having achieved remission [facility] Assessment & Plan: Seeing Dr Debra Hale - f/u AML - on rezlidhia.  Dose reduced due to nausea and thrombocytopenia. Plan f/u bone marrow biopsy 03/2023 or when anxiety controlled.  Platelet count improved.    Chemotherapy-induced neuropathy (HCC) Assessment & Plan: Stable.    Chronic diastolic CHF (congestive heart failure) (HCC) Assessment & Plan: 2D echo on 09/05/2020 showed EF 60-65% with grade 1 diastolic dysfunction. No evidence of volume overload on exam.  Breathing stable.    Major depressive disorder, single episode, mild [facility]) Assessment & Plan: Seeing Dr Debra Hale.  Currently on lexapro and clonazepam.  Recently started amitriptyline.  Just increased dose to two per day.  Does feel is helping.  Overall improved from last visit. Continue f/u with psychiatry.    Pancytopenia [facility]) Assessment & Plan: Hematology following. Receiving chemo.    Stress Assessment & Plan: Increased stress with her current medical issues.  Seeing Dr Debra Hale. On lexapro and clonazepam. Recently started on amitriptyline.  Just increased to two per day. Does feel is helping.  Follow. Continue f/u with Dr Debra Hale.    Hypothyroidism, unspecified type Assessment & Plan: Recent tsh suppressed.  Adjusted synthroid dose.  TSH now wnl.  Follow.  Discussed today - could have been contributing some to tremors, etc.  Follow.    Hypercholesterolemia Assessment  & Plan: Follow lipid panel.    History of thyroid cancer Assessment & Plan: Continue synthroid.  Follow tsh.    History of breast cancer Assessment & Plan: Mammogram 07/29/21 - Birads II. Due f/u mammogram.  Have discussed. She declines mammogram.    Gastroesophageal reflux disease, unspecified whether esophagitis present Assessment & Plan: No upper symptoms reported. On protonix.    Tremor Assessment & Plan: Feel multifactorial.  Exam as outlined.  TSH now wnl.  Have adjusted synthroid dose. Being followed by psychiatry.  On amitriptyline.  Doing better on current dose.  Plan neurology evaluation as outlined.        Dale Jamul, MD

## 2023-02-14 ENCOUNTER — Encounter: Payer: Self-pay | Admitting: Internal Medicine

## 2023-02-14 ENCOUNTER — Telehealth: Payer: Self-pay | Admitting: Internal Medicine

## 2023-02-14 DIAGNOSIS — R251 Tremor, unspecified: Secondary | ICD-10-CM | POA: Insufficient documentation

## 2023-02-14 NOTE — Assessment & Plan Note (Signed)
Feel multifactorial.  Exam as outlined.  TSH now wnl.  Have adjusted synthroid dose. Being followed by psychiatry.  On amitriptyline.  Doing better on current dose.  Plan neurology evaluation as outlined.

## 2023-02-14 NOTE — Assessment & Plan Note (Signed)
No upper symptoms reported.  On protonix.

## 2023-02-14 NOTE — Assessment & Plan Note (Signed)
Seeing Dr Lenis Noon.  Currently on lexapro and clonazepam.  Recently started amitriptyline.  Just increased dose to two per day.  Does feel is helping.  Overall improved from last visit. Continue f/u with psychiatry.

## 2023-02-14 NOTE — Assessment & Plan Note (Signed)
Recent tsh suppressed.  Adjusted synthroid dose.  TSH now wnl.  Follow.  Discussed today - could have been contributing some to tremors, etc.  Follow.

## 2023-02-14 NOTE — Assessment & Plan Note (Signed)
Follow lipid panel.

## 2023-02-14 NOTE — Assessment & Plan Note (Signed)
Continue synthroid. Follow tsh.

## 2023-02-14 NOTE — Assessment & Plan Note (Signed)
2D echo on 09/05/2020 showed EF 60-65% with grade 1 diastolic dysfunction. No evidence of volume overload on exam.  Breathing stable.

## 2023-02-14 NOTE — Assessment & Plan Note (Signed)
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  Dose reduced due to nausea and thrombocytopenia. Plan f/u bone marrow biopsy 03/2023 or when anxiety controlled.  Platelet count improved.

## 2023-02-14 NOTE — Telephone Encounter (Signed)
Had referred to neurology.  It appears they have been trying to contact her. Please call and notify them that she is interested in appt and would like one as soon as possible.  Please have them call [name] [phone] for appt.

## 2023-02-14 NOTE — Assessment & Plan Note (Signed)
Stable

## 2023-02-14 NOTE — Assessment & Plan Note (Addendum)
Mammogram 07/29/21 - Birads II. Due f/u mammogram.  Have discussed. She declines mammogram.

## 2023-02-14 NOTE — Assessment & Plan Note (Signed)
Increased stress with her current medical issues.  Seeing Dr Lenis Noon. On lexapro and clonazepam. Recently started on amitriptyline.  Just increased to two per day. Does feel is helping.  Follow. Continue f/u with Dr Lenis Noon.

## 2023-02-14 NOTE — Assessment & Plan Note (Signed)
Hematology following. Receiving chemo.

## 2023-02-15 ENCOUNTER — Other Ambulatory Visit: Payer: Self-pay

## 2023-02-15 ENCOUNTER — Other Ambulatory Visit: Payer: Self-pay | Admitting: Pharmacist

## 2023-02-15 DIAGNOSIS — C9200 Acute myeloblastic leukemia, not having achieved remission: Secondary | ICD-10-CM

## 2023-02-15 NOTE — Telephone Encounter (Signed)
Called [facility] neurology and provided information for Debra Hale. They are going to reach out to schedule appt.

## 2023-02-17 ENCOUNTER — Ambulatory Visit: Payer: PPO | Admitting: Internal Medicine

## 2023-02-17 ENCOUNTER — Other Ambulatory Visit: Payer: Self-pay

## 2023-02-22 ENCOUNTER — Other Ambulatory Visit: Payer: Self-pay

## 2023-02-22 NOTE — Progress Notes (Signed)
Specialty Pharmacy Refill Coordination Note  Debra Hale is a 72 y.o. female contacted today regarding refills of specialty medication(s) Olutasidenib   Patient requested Delivery   Delivery date: 03/01/23   Verified address: [address], [place], [address]   Medication will be filled on 02/26/23.

## 2023-03-08 ENCOUNTER — Inpatient Hospital Stay: Payer: PPO | Admitting: Pharmacist

## 2023-03-08 ENCOUNTER — Inpatient Hospital Stay (HOSPITAL_BASED_OUTPATIENT_CLINIC_OR_DEPARTMENT_OTHER): Payer: PPO | Admitting: Oncology

## 2023-03-08 ENCOUNTER — Inpatient Hospital Stay: Payer: PPO | Attending: Oncology

## 2023-03-08 VITALS — BP 125/79 | HR 111 | Temp 97.6°F | Resp 18 | Ht 64.0 in | Wt 121.0 lb

## 2023-03-08 DIAGNOSIS — C9200 Acute myeloblastic leukemia, not having achieved remission: Secondary | ICD-10-CM

## 2023-03-08 DIAGNOSIS — Z79899 Other long term (current) drug therapy: Secondary | ICD-10-CM | POA: Insufficient documentation

## 2023-03-08 DIAGNOSIS — Z79624 Long term (current) use of inhibitors of nucleotide synthesis: Secondary | ICD-10-CM | POA: Insufficient documentation

## 2023-03-08 LAB — COMPREHENSIVE METABOLIC PANEL
ALT: 54 U/L — ABNORMAL HIGH (ref 0–44)
AST: 22 U/L (ref 15–41)
Albumin: 4.4 g/dL (ref 3.5–5.0)
Alkaline Phosphatase: 66 U/L (ref 38–126)
Anion gap: 9 (ref 5–15)
BUN: 15 mg/dL (ref 8–23)
CO2: 28 mmol/L (ref 22–32)
Calcium: 9.2 mg/dL (ref 8.9–10.3)
Chloride: 102 mmol/L (ref 98–111)
Creatinine, Ser: 1.15 mg/dL — ABNORMAL HIGH (ref 0.44–1.00)
GFR, Estimated: 50 mL/min — ABNORMAL LOW (ref >60)
Glucose, Bld: 120 mg/dL — ABNORMAL HIGH (ref 70–99)
Potassium: 3.9 mmol/L (ref 3.5–5.1)
Sodium: 139 mmol/L (ref 135–145)
Total Bilirubin: 0.9 mg/dL (ref <1.2)
Total Protein: 7.1 g/dL (ref 6.5–8.1)

## 2023-03-08 LAB — CBC WITH DIFFERENTIAL/PLATELET
Abs Immature Granulocytes: 0.01 10*3/uL (ref 0.00–0.07)
Basophils Absolute: 0 10*3/uL (ref 0.0–0.1)
Basophils Relative: 0 %
Eosinophils Absolute: 0 10*3/uL (ref 0.0–0.5)
Eosinophils Relative: 1 %
HCT: 40.9 % (ref 36.0–46.0)
Hemoglobin: 14.4 g/dL (ref 12.0–15.0)
Immature Granulocytes: 0 %
Lymphocytes Relative: 12 %
Lymphs Abs: 0.7 10*3/uL (ref 0.7–4.0)
MCH: 34.3 pg — ABNORMAL HIGH (ref 26.0–34.0)
MCHC: 35.2 g/dL (ref 30.0–36.0)
MCV: 97.4 fL (ref 80.0–100.0)
Monocytes Absolute: 0.3 10*3/uL (ref 0.1–1.0)
Monocytes Relative: 6 %
Neutro Abs: 4.8 10*3/uL (ref 1.7–7.7)
Neutrophils Relative %: 81 %
Platelets: 125 10*3/uL — ABNORMAL LOW (ref 150–400)
RBC: 4.2 MIL/uL (ref 3.87–5.11)
RDW: 14.9 % (ref 11.5–15.5)
WBC: 6 10*3/uL (ref 4.0–10.5)
nRBC: 0 % (ref 0.0–0.2)

## 2023-03-08 NOTE — Progress Notes (Signed)
Mucus in throat for about 2 months, lots of throat clearing, and anxiety and nervousness (shaking), meds not helping enough - sees prescriber of these meds this afternoon. Some heart palpitations.

## 2023-03-08 NOTE — Progress Notes (Signed)
[facility]  [phone] Fax:(336) [phone]  ID: LARUEN RISSER OB: [birth year]  MR#: [id number]  HQI#:[id number]  Patient Care Team: Dale Cozad, MD as PCP - General (Internal Medicine) Iran Ouch, MD as PCP - Cardiology (Cardiology) Jeralyn Ruths, MD as Consulting Physician (Oncology)  CHIEF COMPLAINT: AML.  INTERVAL HISTORY: Patient returns to clinic today for further evaluation and continuation of treatment.  She continues to have issues with increased anxiety and is being monitored by her primary psychiatrist.  She otherwise feels well.  She continues to take dose reduced Rezlidhia. She denies any pain.  She has no neurologic complaints.  She denies any recent fevers or illnesses.  She has a fair appetite, but denies weight loss.  She has no chest pain, shortness of breath, cough, or hemoptysis.  She denies any nausea, vomiting, constipation, or diarrhea.  Patient offers no further specific complaints today.  REVIEW OF SYSTEMS:   Review of Systems  Constitutional: Negative.  Negative for fever, malaise/fatigue and weight loss.  HENT:  Negative for congestion and sinus pain.   Respiratory: Negative.  Negative for cough, hemoptysis and shortness of breath.   Cardiovascular: Negative.  Negative for chest pain and leg swelling.  Gastrointestinal: Negative.  Negative for abdominal pain, diarrhea and nausea.  Genitourinary:  Negative for dysuria, frequency and urgency.  Musculoskeletal: Negative.  Negative for back pain and falls.  Skin: Negative.  Negative for rash.  Neurological:  Negative for dizziness, speech change, focal weakness, weakness and headaches.  Psychiatric/Behavioral:  Negative for memory loss. The patient is nervous/anxious.     As per HPI. Otherwise, a complete review of systems is negative.  PAST MEDICAL HISTORY: Past Medical History:  Diagnosis Date   Abdominal pain 03/06/2016   Acute pericarditis 05/31/2013    Anxiety    Arthritis    Osteoarthritis   Breast cancer (HCC) 2009   right breast lumpectomy with rad tx   Colon cancer (HCC)    surgery with chemo and rad tx   Complication of anesthesia    GERD (gastroesophageal reflux disease)    Heart palpitations    History of hiatal hernia    History of kidney stones    History of thyroid cancer 09/18/2008   Qualifier: Diagnosis of   By: Denyse Amass, CMA, Carol       Hypothyroidism    Liver disease    Liver nodule    s/p negative biopsy   Malignant neoplasm of thyroid gland (HCC) 2002   s/p surgery and XRT   Osteoporosis    Other and unspecified hyperlipidemia    Palpitations    Personal history of chemotherapy    Personal history of malignant neoplasm of large intestine    carcinoma - cecum, s/p right laparoscopic colectomy - s/p chemotherapy and XRT   Personal history of radiation therapy    Pneumonia 2019   PONV (postoperative nausea and vomiting)    Pure hypercholesterolemia    Unspecified hereditary and idiopathic peripheral neuropathy     PAST SURGICAL HISTORY: Past Surgical History:  Procedure Laterality Date   APPENDECTOMY  1985   BREAST BIOPSY Right 2009   positive   BREAST BIOPSY Right 2009   negative   BREAST LUMPECTOMY Right 2009   breast cancer   CHOLECYSTECTOMY  1995   COLONOSCOPY     COLONOSCOPY WITH PROPOFOL N/A 10/29/2017   Procedure: COLONOSCOPY WITH PROPOFOL;  Surgeon: Scot Jun, MD;  Location: [facility] ENDOSCOPY;  Service: Endoscopy;  Laterality:  N/A;   DILATION AND CURETTAGE OF UTERUS  1990   DILATION AND CURETTAGE, DIAGNOSTIC / THERAPEUTIC  1990   ESOPHAGOGASTRODUODENOSCOPY     ESOPHAGOGASTRODUODENOSCOPY (EGD) WITH PROPOFOL N/A 10/29/2017   Procedure: ESOPHAGOGASTRODUODENOSCOPY (EGD) WITH PROPOFOL;  Surgeon: Scot Jun, MD;  Location: [facility] ENDOSCOPY;  Service: Endoscopy;  Laterality: N/A;   INCISION AND DRAINAGE PERIRECTAL ABSCESS N/A 06/17/2020   Procedure: IRRIGATION AND DEBRIDEMENT PERIRECTAL  ABSCESS;  Surgeon: Leafy Ro, MD;  Location: [facility] ORS;  Service: General;  Laterality: N/A;   IR BONE MARROW BIOPSY & ASPIRATION  07/09/2021   LAPAROSCOPIC PARTIAL COLECTOMY     stage 3-C carcinoma of the cecum, s/p chemotherapy and xrt   LITHOTRIPSY     SIGMOIDOSCOPY  08/26/1993   THYROID LOBECTOMY  2002   s/p XRT   TOTAL HIP ARTHROPLASTY Right 10/24/2019   Procedure: TOTAL HIP ARTHROPLASTY;  Surgeon: Christena Flake, MD;  Location: [facility] ORS;  Service: Orthopedics;  Laterality: Right;    FAMILY HISTORY: Family History  Problem Relation Age of Onset   Stroke Mother        52s   Alzheimer's disease Mother    Lung cancer Father    Prostate cancer Father    Cancer Father        Colon   Colon cancer Father    Breast cancer Sister        5's   Lung cancer Sister    Breast cancer Maternal Aunt     ADVANCED DIRECTIVES (Y/N):  N  HEALTH MAINTENANCE: Social History   Tobacco Use   Smoking status: Never   Smokeless tobacco: Never  Vaping Use   Vaping status: Never Used  Substance Use Topics   Alcohol use: No    Alcohol/week: 0.0 standard drinks of alcohol   Drug use: No     Colonoscopy:  PAP:  Bone density:  Lipid panel:  Allergies  Allergen Reactions   Demeclocycline Other (See Comments)    Throat swells   Sulfa Antibiotics Other (See Comments)    Other reaction(s): Other (See Comments) Throat swells Other reaction(s): Other (See Comments) Throat swells Throat swells Other reaction(s): Other (See Comments) Throat swells Other reaction(s): Unknown Other reaction(s): Other (See Comments) Throat swells Other reaction(s): Other (See Comments) Other reaction(s): Other (See Comments) Throat swellsOther reaction(s): Other (See Comments) Throat swells   Tetracyclines & Related Other (See Comments)    Throat swells   Chlordiazepoxide    Dicyclomine    Levofloxacin Diarrhea    Severe diarrhea   Augmentin [Amoxicillin-Pot Clavulanate] Diarrhea   Bentyl  [Dicyclomine Hcl]     unkn   Ciprofloxacin Diarrhea   Codeine Other (See Comments)    dizziness     Dicyclomine Hcl Other (See Comments)    unkn   Epinephrine     Speeds heart up - panic    Flagyl [Metronidazole] Nausea And Vomiting   Librax [Chlordiazepoxide-Clidinium]     unkn   Novocain [Procaine] Other (See Comments)    "Shaky"   Phenobarbital     unkn   Prednisone     Nervous     Tramadol Other (See Comments)    Sick feeling    Current Outpatient Medications  Medication Sig Dispense Refill   amitriptyline (ELAVIL) 10 MG tablet Take 10 mg by mouth in the morning and at bedtime.     clonazePAM (KLONOPIN) 0.5 MG tablet Take 1 tablet (0.5 mg total) by mouth 3 (three) times daily as needed for  anxiety. 30 tablet 0   escitalopram (LEXAPRO) 20 MG tablet Take 20 mg by mouth every morning.      levothyroxine (SYNTHROID) 88 MCG tablet Take 1 tablet (88 mcg total) by mouth daily. 90 tablet 0   olutasidenib (REZLIDHIA) 150 MG capsule Take 1 capsule (150 mg total) by mouth daily. Take on an empty stomach at least 1 hour before or 2 hours after a meal. 30 capsule 2   pantoprazole (PROTONIX) 40 MG tablet TAKE ONE TABLET (40 MG) BY MOUTH EVERY DAY 90 tablet 2   potassium chloride (KLOR-CON M) 10 MEQ tablet Take 1 tablet (10 mEq total) by mouth 2 (two) times daily. 60 tablet 2   valACYclovir (VALTREX) 500 MG tablet TAKE ONE TABLET BY MOUTH DAILY 30 tablet 2   Azelastine-Fluticasone 137-50 MCG/ACT SUSP SPRAY 2 SPRAYS INTO EACH NOSTRIL EVERY DAY (Patient not taking: Reported on 03/08/2023) 23 g 2   prochlorperazine (COMPAZINE) 10 MG tablet TAKE ONE TABLET BY MOUTH EVERY 6 HOURS AS NEEDED FOR NAUSEA / VOMITING (Patient not taking: Reported on 03/08/2023) 30 tablet 3   No current facility-administered medications for this visit.    OBJECTIVE: Vitals:   03/08/23 1053  BP: 125/79  Pulse: (!) 111  Resp: 18  Temp: 97.6 F (36.4 C)  SpO2: 100%     Body mass index is 20.77 kg/m.    ECOG  FS:0 - Asymptomatic  General: Well-developed, well-nourished, no acute distress. Eyes: Pink conjunctiva, anicteric sclera. HEENT: Normocephalic, moist mucous membranes. Lungs: No audible wheezing or coughing. Heart: Regular rate and rhythm. Abdomen: Soft, nontender, no obvious distention. Musculoskeletal: No edema, cyanosis, or clubbing. Neuro: Alert, answering all questions appropriately. Cranial nerves grossly intact. Skin: No rashes or petechiae noted. Psych: Flat affect.  LAB RESULTS:  Lab Results  Component Value Date   NA 139 03/08/2023   K 3.9 03/08/2023   CL 102 03/08/2023   CO2 28 03/08/2023   GLUCOSE 120 (H) 03/08/2023   BUN 15 03/08/2023   CREATININE 1.15 (H) 03/08/2023   CALCIUM 9.2 03/08/2023   PROT 7.1 03/08/2023   ALBUMIN 4.4 03/08/2023   AST 22 03/08/2023   ALT 54 (H) 03/08/2023   ALKPHOS 66 03/08/2023   BILITOT 0.9 03/08/2023   GFRNONAA 50 (L) 03/08/2023   GFRAA 54 (L) 10/26/2019    Lab Results  Component Value Date   WBC 6.0 03/08/2023   NEUTROABS 4.8 03/08/2023   HGB 14.4 03/08/2023   HCT 40.9 03/08/2023   MCV 97.4 03/08/2023   PLT 125 (L) 03/08/2023     STUDIES: No results found.   ASSESSMENT: AML.  PLAN:    AML: Confirmed by bone marrow biopsy.  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  Patient's most recent bone marrow biopsy on March 23, 2022 revealed an increase of her blast count from 4% previously to up to 15% currently.  She last received treatment with cycle 22 of Vidaza on February 13, 2022.  Given her progressive disease and  IDH-1 mutation, Vidaza was discontinued and patient initially received 150 mg twice daily of Olutasidenib (Rezlidhia).  This has been now dose reduce to once per day secondary to nausea and thrombocytopenia.  Patient will require repeat bone marrow biopsy in the near future, but given her  psychiatric and anxiety issues, this may be delayed until her medication regimen is optimized by her psychiatrist.  No intervention is  needed at this time.  Return to clinic in 4 weeks for laboratory work and further evaluation.  Appreciate clinical pharmacy input.   Neutropenia: Resolved.  Continue treatment as above. Thrombocytopenia: Chronic and unchanged.  Patient's platelet count is 125 today.  Continue dose reduced treatment once per day as above. Diarrhea: Resolved.  Continue Lomotil as needed.   Anemia: Resolved. Nausea: Improved with dose reduction.  Continue ondansetron and Compazine as needed.   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  Patient also received chemotherapy and likely received Adriamycin, but exact regimen is unknown.  She completed 5 years of hormonal therapy in approximately June 2015. Currently, she has no evidence of disease.  Her most recent mammogram on July 29, 2021 was reported as BI-RADS 2.   History of colon cancer: Patient also states that she received chemotherapy for this, possibly FOLFOX but again this is unknown. Hair thinning: Improving.  Likely related to thyroid dosing. Psychiatric/anxiety: Patient now on amitriptyline.  Continue follow-up and treatment per primary psychiatrist.  I spent a total of 30 minutes reviewing chart data, face-to-face evaluation with the patient, counseling and coordination of care as detailed above.  Patient expressed understanding and was in agreement with this plan. She also understands that She can call clinic at any time with any questions, concerns, or complaints.    Cancer Staging  History of breast cancer Staging form: Breast, AJCC 7th Edition - Clinical stage from 01/07/2016: Stage IA (T1c, N0, M0) - Signed by Jeralyn Ruths, MD on  01/07/2016 Laterality: Right Estrogen receptor status: Positive Progesterone receptor status: Positive HER2 status: Negative   Jeralyn Ruths, MD   03/08/2023 4:46 PM

## 2023-03-09 NOTE — Progress Notes (Signed)
Oral Chemotherapy Clinic [facility]  Telephone:([phone] Fax:(336) [phone]  Patient Care Team: Dale Harrison, MD as PCP - General (Internal Medicine) Iran Ouch, MD as PCP - Cardiology (Cardiology) Jeralyn Ruths, MD as Consulting Physician (Oncology)   Name of the patient: Debra Hale  [id number]  February 26, 1951   Encounter Date 03/08/2023  HPI: Patient is a 72 y.o. female with AML. Previously treated with azacitadine and venetoclax. A repeat BM biopsy showed disease progression. 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 She was dose reduced to olutasidenib 100mg  daily on 06/22/22 due to decreased pltc.   Reason for Consult: Oral chemotherapy follow-up for Rezlidhia (olutasidenib) therapy.   PAST MEDICAL HISTORY: Past Medical History:  Diagnosis Date   Abdominal pain 03/06/2016   Acute pericarditis 05/31/2013   Anxiety    Arthritis    Osteoarthritis   Breast cancer (HCC) 2009   right breast lumpectomy with rad tx   Colon cancer (HCC)    surgery with chemo and rad tx   Complication of anesthesia    GERD (gastroesophageal reflux disease)    Heart palpitations    History of hiatal hernia    History of kidney stones    History of thyroid cancer 09/18/2008   Qualifier: Diagnosis of   By: Denyse Amass CMA, Carol       Hypothyroidism    Liver disease    Liver nodule    s/p negative biopsy   Malignant neoplasm of thyroid gland (HCC) 2002   s/p surgery and XRT   Osteoporosis    Other and unspecified hyperlipidemia    Palpitations    Personal history of chemotherapy    Personal history of malignant neoplasm of large intestine    carcinoma - cecum, s/p right laparoscopic colectomy - s/p chemotherapy and XRT   Personal history of radiation therapy    Pneumonia 2019   PONV (postoperative nausea and vomiting)    Pure hypercholesterolemia    Unspecified hereditary and idiopathic peripheral  neuropathy     HEMATOLOGY/ONCOLOGY HISTORY:  Oncology History  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 Initial Diagnosis   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HCC)   06/10/2020 - 12/19/2021 Chemotherapy   Patient is on Treatment Plan : AML dose reduced azacitidine SQ D1-5 q28d     06/10/2020 - 02/13/2022 Chemotherapy   Patient is on Treatment Plan : AML Azacitidine SQ D1-5 q28d       ALLERGIES:  is allergic to demeclocycline, sulfa antibiotics, tetracyclines & related, chlordiazepoxide, dicyclomine, levofloxacin, augmentin [amoxicillin-pot clavulanate], bentyl [dicyclomine hcl], ciprofloxacin, codeine, dicyclomine hcl, epinephrine, flagyl [metronidazole], librax [chlordiazepoxide-clidinium], novocain [procaine], phenobarbital, prednisone, and tramadol.  MEDICATIONS:  Current Outpatient Medications  Medication Sig Dispense Refill   amitriptyline (ELAVIL) 10 MG tablet Take 10 mg by mouth in the morning and at bedtime.     Azelastine-Fluticasone 137-50 MCG/ACT SUSP SPRAY 2 SPRAYS INTO EACH NOSTRIL EVERY DAY (Patient not taking: Reported on 03/08/2023) 23 g 2   clonazePAM (KLONOPIN) 0.5 MG tablet Take 1 tablet (0.5 mg total) by mouth 3 (three) times daily as needed for anxiety. 30 tablet 0   escitalopram (LEXAPRO) 20 MG tablet Take 20 mg by mouth every morning.      levothyroxine (SYNTHROID) 88 MCG tablet Take 1 tablet (88 mcg total) by mouth daily. 90 tablet 0   olutasidenib (REZLIDHIA) 150 MG capsule Take 1 capsule (150 mg total) by mouth daily. Take on an empty stomach at least 1  hour before or 2 hours after a meal. 30 capsule 2   pantoprazole (PROTONIX) 40 MG tablet TAKE ONE TABLET (40 MG) BY MOUTH EVERY DAY 90 tablet 2   potassium chloride (KLOR-CON M) 10 MEQ tablet Take 1 tablet (10 mEq total) by mouth 2 (two) times daily. 60 tablet 2   prochlorperazine (COMPAZINE) 10 MG tablet TAKE ONE TABLET BY MOUTH EVERY 6 HOURS AS NEEDED FOR NAUSEA / VOMITING (Patient not taking: Reported on  03/08/2023) 30 tablet 3   valACYclovir (VALTREX) 500 MG tablet TAKE ONE TABLET BY MOUTH DAILY 30 tablet 2   No current facility-administered medications for this visit.    VITAL SIGNS: There were no vitals taken for this visit. There were no vitals filed for this visit.    Estimated body mass index is 20.77 kg/m as calculated from the following:   Height as of an earlier encounter on 03/08/23: 5\' 4"  (1.626 m).   Weight as of an earlier encounter on 03/08/23: 54.9 kg (121 lb).  LABS: CBC:    Component Value Date/Time   WBC 6.0 03/08/2023 1041   HGB 14.4 03/08/2023 1041   HGB 12.5 05/09/2018 1147   HCT 40.9 03/08/2023 1041   HCT 36.8 05/09/2018 1147   PLT 125 (L) 03/08/2023 1041   PLT 285 05/09/2018 1147   MCV 97.4 03/08/2023 1041   MCV 86 05/09/2018 1147   MCV 89 11/30/2013 1419   NEUTROABS 4.8 03/08/2023 1041   NEUTROABS 2.9 11/20/2013 1404   LYMPHSABS 0.7 03/08/2023 1041   LYMPHSABS 0.9 (L) 11/20/2013 1404   MONOABS 0.3 03/08/2023 1041   MONOABS 0.2 11/20/2013 1404   EOSABS 0.0 03/08/2023 1041   EOSABS 0.1 11/20/2013 1404   BASOSABS 0.0 03/08/2023 1041   BASOSABS 0.0 11/20/2013 1404   Comprehensive Metabolic Panel:    Component Value Date/Time   NA 139 03/08/2023 1041   NA 143 05/09/2018 1147   NA 145 11/30/2013 1419   K 3.9 03/08/2023 1041   K 3.5 11/30/2013 1419   CL 102 03/08/2023 1041   CL 109 (H) 11/30/2013 1419   CO2 28 03/08/2023 1041   CO2 29 11/30/2013 1419   BUN 15 03/08/2023 1041   BUN 11 05/09/2018 1147   BUN 15 11/30/2013 1419   CREATININE 1.15 (H) 03/08/2023 1041   CREATININE 1.04 11/30/2013 1419   GLUCOSE 120 (H) 03/08/2023 1041   GLUCOSE 97 11/30/2013 1419   CALCIUM 9.2 03/08/2023 1041   CALCIUM 8.7 11/30/2013 1419   AST 22 03/08/2023 1041   AST 28 11/30/2013 1419   ALT 54 (H) 03/08/2023 1041   ALT 71 (H) 11/30/2013 1419   ALKPHOS 66 03/08/2023 1041   ALKPHOS 114 11/30/2013 1419   BILITOT 0.9 03/08/2023 1041   BILITOT 0.7 11/30/2013  1419   PROT 7.1 03/08/2023 1041   PROT 7.1 11/30/2013 1419   ALBUMIN 4.4 03/08/2023 1041   ALBUMIN 3.3 (L) 11/30/2013 1419     Present during today's visit: patient and her friend Debra Hale  Assessment and Plan: CMP/CBC reviewed, continue reduced dose olutasidenib 150mg  daily  Patient  continues to report feeling "jittery" or anxious off and on. Her medication are actively by her psychiatrist Dr. Mare Ferrari. Of note, patient is delaying repeat bone marrow biopsy due to inability to lay for the procedure due to anxiety/jitters. Patient knows to let the clinic know when she feels she can have them done.  Patient continues to report hoarseness and mucus in throat. Recommended the use of guaifenesin.  Oral Chemotherapy Side Effect/Intolerance:  Nausea and diarrhea: currently manageable  Oral Chemotherapy Adherence: No missed doses reported There are no patient barriers to medication adherence identified.   New medications: None reported   Medication Access Issues: No issues, patient fills at [facility].   Patient expressed understanding and was in agreement with this plan. She also understands that She can call clinic at any time with any questions, concerns, or complaints.   Follow-up plan: RTC in 4 weeks lab/MD.   Thank you for allowing me to participate in the care of this very pleasant patient.   Time Total: 15 minutes  Visit consisted of counseling and education on dealing with issues of symptom management in the setting of serious and potentially life-threatening illness.Greater than 50%  of this time was spent counseling and coordinating care related to the above assessment and plan.  Signed by: Remi Haggard, PharmD, BCPS, Nolon Bussing, CPP Hematology/Oncology Clinical Pharmacist Practitioner [place]/DB/AP Oral Chemotherapy Navigation Clinic [phone]  03/09/2023 10:49 AM

## 2023-03-15 ENCOUNTER — Other Ambulatory Visit: Payer: Self-pay | Admitting: Pharmacist

## 2023-03-15 ENCOUNTER — Telehealth: Payer: Self-pay

## 2023-03-15 ENCOUNTER — Other Ambulatory Visit: Payer: Self-pay | Admitting: Internal Medicine

## 2023-03-15 DIAGNOSIS — C9200 Acute myeloblastic leukemia, not having achieved remission: Secondary | ICD-10-CM

## 2023-03-15 MED ORDER — PROCHLORPERAZINE MALEATE 10 MG PO TABS
10.0000 mg | ORAL_TABLET | Freq: Four times a day (QID) | ORAL | 3 refills | Status: DC | PRN
Start: 2023-03-15 — End: 2023-08-23

## 2023-03-15 MED ORDER — LEVOTHYROXINE SODIUM 88 MCG PO TABS: 88.0000 ug | ORAL_TABLET | Freq: Every day | ORAL | 1 refills | Status: DC

## 2023-03-15 NOTE — Telephone Encounter (Signed)
Was unable to leave voicemail on pt's number. Called Calvin's number and left a voicemail informing pt correct dosage

## 2023-03-15 NOTE — Telephone Encounter (Signed)
Patient states she thinks she is supposed to be on 100 mcg of synthroid.  Patient states her bottle has 100 mcg on the label.  Patient states they only have a prescription for 88 mcg.  Patient states her preferred pharmacy is [facility].

## 2023-03-15 NOTE — Telephone Encounter (Signed)
Patient states she needs clarification regarding her synthroid medication.  Patient states she would like to know if she is supposed to be taking 88 mcg or 100 mcg.  Patient states there is some confusion with the pharmacy.  Patient states she would like to know today if possible.

## 2023-03-15 NOTE — Telephone Encounter (Signed)
Prescription Request  03/15/2023  LOV: Visit date not found  What is the name of the medication or equipment? levothyroxine (SYNTHROID) 88 MCG tablet  Have you contacted your pharmacy to request a refill? Yes   Which pharmacy would you like this sent to?  [facility] - [place], [place] - [address] [place] [address] Phone: [phone] Fax: [phone]   Patient notified that their request is being sent to the clinical staff for review and that they should receive a response within 2 business days.   Please advise at Mobile [phone] (mobile)  Patient states she has one pill left, but she is not sure whether she is supposed to be taking 100 mcg or 88 mcg.  Patient states she would like to have a call back today.

## 2023-03-15 NOTE — Telephone Encounter (Signed)
She is supposed to be on synthroid q day.  [facility]. Please call her.  Thanks.

## 2023-03-16 NOTE — Telephone Encounter (Signed)
Please refuse. She is supposed to be on 88 mcg. Correct dose was sent in yesterday.

## 2023-03-16 NOTE — Telephone Encounter (Signed)
Called patient. Mail box full.

## 2023-03-18 NOTE — Telephone Encounter (Signed)
Pt confirmed she has picked up her thyroid medication

## 2023-03-22 ENCOUNTER — Other Ambulatory Visit (HOSPITAL_COMMUNITY): Payer: Self-pay

## 2023-03-22 ENCOUNTER — Other Ambulatory Visit: Payer: Self-pay

## 2023-03-22 ENCOUNTER — Other Ambulatory Visit: Payer: Self-pay | Admitting: Oncology

## 2023-03-22 DIAGNOSIS — C9200 Acute myeloblastic leukemia, not having achieved remission: Secondary | ICD-10-CM

## 2023-03-22 MED ORDER — OLUTASIDENIB 150 MG PO CAPS
150.0000 mg | ORAL_CAPSULE | Freq: Every day | ORAL | 2 refills | Status: DC
  Filled 2023-03-22: qty 30, 30d supply, fill #0
  Filled 2023-04-26: qty 30, 30d supply, fill #1
  Filled 2023-06-09: qty 30, 30d supply, fill #2

## 2023-03-22 NOTE — Progress Notes (Signed)
Specialty Pharmacy Refill Coordination Note  Debra Hale is a 72 y.o. female contacted today regarding refills of specialty medication(s) Olutasidenib   Patient requested Delivery   Delivery date: 03/30/23   Verified address: [address]   Medication will be filled on 03/29/23. Pending Refill Request.

## 2023-03-22 NOTE — Progress Notes (Signed)
Specialty Pharmacy Ongoing Clinical Assessment Note  Debra Hale is a 72 y.o. female who is being followed by the specialty pharmacy service for RxSp Oncology   Patient's specialty medication(s) reviewed today: Olutasidenib   Missed doses in the last 4 weeks: 0   Patient/Caregiver did not have any additional questions or concerns.   Therapeutic benefit summary: Unable to assess   Adverse events/side effects summary: No adverse events/side effects   Patient's therapy is appropriate to: Continue    Goals Addressed             This Visit's Progress    Stabilization of disease       Patient is on track. Patient will maintain adherence         Follow up:  6 months  Bobette Mo Specialty Pharmacist

## 2023-03-25 ENCOUNTER — Encounter: Payer: Self-pay | Admitting: Internal Medicine

## 2023-03-25 ENCOUNTER — Ambulatory Visit (INDEPENDENT_AMBULATORY_CARE_PROVIDER_SITE_OTHER): Payer: PPO | Admitting: Internal Medicine

## 2023-03-25 VITALS — BP 126/84 | HR 99 | Temp 97.3°F | Ht 64.0 in | Wt 123.0 lb

## 2023-03-25 DIAGNOSIS — K219 Gastro-esophageal reflux disease without esophagitis: Secondary | ICD-10-CM

## 2023-03-25 DIAGNOSIS — D61818 Other pancytopenia: Secondary | ICD-10-CM

## 2023-03-25 DIAGNOSIS — E7800 Pure hypercholesterolemia, unspecified: Secondary | ICD-10-CM

## 2023-03-25 DIAGNOSIS — R490 Dysphonia: Secondary | ICD-10-CM

## 2023-03-25 DIAGNOSIS — F320 Major depressive disorder, single episode, mild: Secondary | ICD-10-CM

## 2023-03-25 DIAGNOSIS — I5032 Chronic diastolic (congestive) heart failure: Secondary | ICD-10-CM

## 2023-03-25 DIAGNOSIS — R319 Hematuria, unspecified: Secondary | ICD-10-CM

## 2023-03-25 DIAGNOSIS — R251 Tremor, unspecified: Secondary | ICD-10-CM

## 2023-03-25 DIAGNOSIS — Z85850 Personal history of malignant neoplasm of thyroid: Secondary | ICD-10-CM

## 2023-03-25 DIAGNOSIS — T451X5A Adverse effect of antineoplastic and immunosuppressive drugs, initial encounter: Secondary | ICD-10-CM

## 2023-03-25 DIAGNOSIS — G620 Drug-induced polyneuropathy: Secondary | ICD-10-CM

## 2023-03-25 DIAGNOSIS — Z853 Personal history of malignant neoplasm of breast: Secondary | ICD-10-CM

## 2023-03-25 DIAGNOSIS — F419 Anxiety disorder, unspecified: Secondary | ICD-10-CM

## 2023-03-25 DIAGNOSIS — C9200 Acute myeloblastic leukemia, not having achieved remission: Secondary | ICD-10-CM

## 2023-03-25 MED ORDER — PANTOPRAZOLE SODIUM 40 MG PO TBEC: 40.0000 mg | DELAYED_RELEASE_TABLET | Freq: Two times a day (BID) | ORAL | 1 refills | Status: DC

## 2023-03-25 NOTE — Progress Notes (Signed)
Subjective:    Patient ID: Debra Hale, female    DOB: 1950-09-03, 72 y.o.   MRN: [id number]  Patient here for  Chief Complaint  Patient presents with   Medical Management of Chronic Issues    HPI Here to follow up regarding increased stress/anxiety, GERD and hypercholesterolemia. 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  Taking rezlidhia. Persistent issues with hoarseness.  Taking protonix.  Controls relatively well.  Will still have some acid reflux. Discussed taking bid to see if helps with hoarseness.  No significant increased drainage.  Will start using dymista more regularly to see if improves. Discussed given persistent hoarseness, referral to ENT for evaluation.  Breathing stable.  Increased stress.  Discussed.  Increased home stress.  Discussed.  Offered CCM referral for social work evaluation, options for in home resources, meals on wheels, etc.  She declines at this time.  Will notify me if changes her mind.    Past Medical History:  Diagnosis Date   Abdominal pain 03/06/2016   Acute pericarditis 05/31/2013   Anxiety    Arthritis    Osteoarthritis   Breast cancer (HCC) 2009   right breast lumpectomy with rad tx   Colon cancer (HCC)    surgery with chemo and rad tx   Complication of anesthesia    GERD (gastroesophageal reflux disease)    Heart palpitations    History of hiatal hernia    History of kidney stones    History of thyroid cancer 09/18/2008   Qualifier: Diagnosis of   By: Denyse Amass CMA, Carol       Hypothyroidism    Liver disease    Liver nodule    s/p negative biopsy   Malignant neoplasm of thyroid gland (HCC) 2002   s/p surgery and XRT   Osteoporosis    Other and unspecified hyperlipidemia    Palpitations    Personal history of chemotherapy    Personal history of malignant neoplasm of large intestine    carcinoma - cecum, s/p right laparoscopic colectomy - s/p chemotherapy and XRT   Personal history of radiation therapy    Pneumonia 2019   PONV  (postoperative nausea and vomiting)    Pure hypercholesterolemia    Unspecified hereditary and idiopathic peripheral neuropathy    Past Surgical History:  Procedure Laterality Date   APPENDECTOMY  1985   BREAST BIOPSY Right 2009   positive   BREAST BIOPSY Right 2009   negative   BREAST LUMPECTOMY Right 2009   breast cancer   CHOLECYSTECTOMY  1995   COLONOSCOPY     COLONOSCOPY WITH PROPOFOL N/A 10/29/2017   Procedure: COLONOSCOPY WITH PROPOFOL;  Surgeon: Scot Jun, MD;  Location[facility] ENDOSCOPY;  Service: Endoscopy;  Laterality: N/A;   DILATION AND CURETTAGE OF UTERUS  1990   DILATION AND CURETTAGE, DIAGNOSTIC / THERAPEUTIC  1990   ESOPHAGOGASTRODUODENOSCOPY     ESOPHAGOGASTRODUODENOSCOPY (EGD) WITH PROPOFOL N/A 10/29/2017   Procedure: ESOPHAGOGASTRODUODENOSCOPY (EGD) WITH PROPOFOL;  Surgeon: Scot Jun, MD;  Location: [facility] ENDOSCOPY;  Service: Endoscopy;  Laterality: N/A;   INCISION AND DRAINAGE PERIRECTAL ABSCESS N/A 06/17/2020   Procedure: IRRIGATION AND DEBRIDEMENT PERIRECTAL ABSCESS;  Surgeon: Leafy Ro, MD;  Location: [facility] ORS;  Service: General;  Laterality: N/A;   IR BONE MARROW BIOPSY & ASPIRATION  07/09/2021   LAPAROSCOPIC PARTIAL COLECTOMY     stage 3-C carcinoma of the cecum, s/p chemotherapy and xrt   LITHOTRIPSY     SIGMOIDOSCOPY  08/26/1993   THYROID  LOBECTOMY  2002   s/p XRT   TOTAL HIP ARTHROPLASTY Right 10/24/2019   Procedure: TOTAL HIP ARTHROPLASTY;  Surgeon: Christena Flake, MD;  Location: [facility] ORS;  Service: Orthopedics;  Laterality: Right;   Family History  Problem Relation Age of Onset   Stroke Mother        60s   Alzheimer's disease Mother    Lung cancer Father    Prostate cancer Father    Cancer Father        Colon   Colon cancer Father    Breast cancer Sister        81's   Lung cancer Sister    Breast cancer Maternal Aunt    Social History   Socioeconomic History   Marital status: Single    Spouse name: Not on file   Number of  children: 0   Years of education: Not on file   Highest education level: Not on file  Occupational History   Occupation: Retired  Tobacco Use   Smoking status: Never   Smokeless tobacco: Never  Vaping Use   Vaping status: Never Used  Substance and Sexual Activity   Alcohol use: No    Alcohol/week: 0.0 standard drinks of alcohol   Drug use: No   Sexual activity: Not Currently  Other Topics Concern   Not on file  Social History Narrative   Not on file   Social Determinants of Health   Financial Resource Strain: Low Risk  (09/18/2022)   Overall Financial Resource Strain (CARDIA)    Difficulty of Paying Living Expenses: Not hard at all  Food Insecurity: No Food Insecurity (09/18/2022)   Hunger Vital Sign    Worried About Running Out of Food in the Last Year: Never true    Ran Out of Food in the Last Year: Never true  Transportation Needs: No Transportation Needs (09/18/2022)   PRAPARE - Administrator, Civil Service (Medical): No    Lack of Transportation (Non-Medical): No  Physical Activity: Insufficiently Active (09/18/2022)   Exercise Vital Sign    Days of Exercise per Week: 1 day    Minutes of Exercise per Session: 10 min  Stress: Stress Concern Present (09/18/2022)   [employer] of Occupational Health - Occupational Stress Questionnaire    Feeling of Stress : To some extent  Social Connections: Moderately Isolated (09/18/2022)   Social Connection and Isolation Panel [NHANES]    Frequency of Communication with Friends and Family: More than three times a week    Frequency of Social Gatherings with Friends and Family: Twice a week    Attends Religious Services: 1 to 4 times per year    Active Member of [employer] or Organizations: No    Attends Banker Meetings: Never    Marital Status: Divorced     Review of Systems  Constitutional:  Negative for appetite change and unexpected weight change.  HENT:  Negative for congestion and sinus pressure.    Respiratory:  Negative for cough, chest tightness and shortness of breath.   Cardiovascular:  Negative for chest pain and palpitations.  Gastrointestinal:  Negative for abdominal pain, nausea and vomiting.  Genitourinary:  Negative for difficulty urinating and dysuria.  Musculoskeletal:  Negative for joint swelling and myalgias.  Skin:  Negative for color change and rash.  Neurological:  Negative for dizziness and headaches.  Psychiatric/Behavioral:  Negative for agitation.        Increased stress as outlined.  Objective:     BP 126/84   Pulse 99   Temp (!) 97.3 F (36.3 C)   Ht 5\' 4"  (1.626 m)   Wt 123 lb (55.8 kg)   SpO2 98%   BMI 21.11 kg/m  Wt Readings from Last 3 Encounters:  03/25/23 123 lb (55.8 kg)  03/08/23 121 lb (54.9 kg)  02/11/23 121 lb 9.6 oz (55.2 kg)    Physical Exam Vitals reviewed.  Constitutional:      General: She is not in acute distress.    Appearance: Normal appearance.  HENT:     Head: Normocephalic and atraumatic.     Right Ear: External ear normal.     Left Ear: External ear normal.  Eyes:     General: No scleral icterus.       Right eye: No discharge.        Left eye: No discharge.     Conjunctiva/sclera: Conjunctivae normal.  Neck:     Thyroid: No thyromegaly.  Cardiovascular:     Rate and Rhythm: Normal rate and regular rhythm.  Pulmonary:     Effort: No respiratory distress.     Breath sounds: Normal breath sounds. No wheezing.  Abdominal:     General: Bowel sounds are normal.     Palpations: Abdomen is soft.     Tenderness: There is no abdominal tenderness.  Musculoskeletal:        General: No swelling or tenderness.     Cervical back: Neck supple. No tenderness.  Lymphadenopathy:     Cervical: No cervical adenopathy.  Skin:    Findings: No erythema or rash.  Neurological:     Mental Status: She is alert.  Psychiatric:        Mood and Affect: Mood normal.        Behavior: Behavior normal.      Outpatient  Encounter Medications as of 03/25/2023  Medication Sig   amitriptyline (ELAVIL) 10 MG tablet Take 10 mg by mouth in the morning and at bedtime.   Azelastine-Fluticasone 137-50 MCG/ACT SUSP SPRAY 2 SPRAYS INTO EACH NOSTRIL EVERY DAY   clonazePAM (KLONOPIN) 0.5 MG tablet Take 1 tablet (0.5 mg total) by mouth 3 (three) times daily as needed for anxiety.   escitalopram (LEXAPRO) 20 MG tablet Take 20 mg by mouth every morning.    levothyroxine (SYNTHROID) 88 MCG tablet Take 1 tablet (88 mcg total) by mouth daily.   olutasidenib (REZLIDHIA) 150 MG capsule Take 1 capsule (150 mg total) by mouth daily. Take on an empty stomach at least 1 hour before or 2 hours after a meal.   potassium chloride (KLOR-CON M) 10 MEQ tablet Take 1 tablet (10 mEq total) by mouth 2 (two) times daily.   prochlorperazine (COMPAZINE) 10 MG tablet Take 1 tablet (10 mg total) by mouth every 6 (six) hours as needed for nausea or vomiting.   valACYclovir (VALTREX) 500 MG tablet TAKE ONE TABLET BY MOUTH DAILY   [DISCONTINUED] pantoprazole (PROTONIX) 40 MG tablet TAKE ONE TABLET (40 MG) BY MOUTH EVERY DAY   pantoprazole (PROTONIX) 40 MG tablet Take 1 tablet (40 mg total) by mouth 2 (two) times daily before a meal.   No facility-administered encounter medications on file as of 03/25/2023.     Lab Results  Component Value Date   WBC 6.0 03/08/2023   HGB 14.4 03/08/2023   HCT 40.9 03/08/2023   PLT 125 (L) 03/08/2023   GLUCOSE 120 (H) 03/08/2023   CHOL 186 12/30/2022  TRIG 118.0 12/30/2022   HDL 57.20 12/30/2022   LDLDIRECT 126.0 07/11/2015   LDLCALC 105 (H) 12/30/2022   ALT 54 (H) 03/08/2023   AST 22 03/08/2023   NA 139 03/08/2023   K 3.9 03/08/2023   CL 102 03/08/2023   CREATININE 1.15 (H) 03/08/2023   BUN 15 03/08/2023   CO2 28 03/08/2023   TSH 0.46 01/26/2023   INR 1.3 (H) 04/04/2022   HGBA1C 5.3 12/10/2016    No results found.     Assessment & Plan:  Acute myeloid leukemia not having achieved remission  [facility] Assessment & Plan: Seeing Dr Orlie Dakin - f/u AML - on rezlidhia.  Dose reduced due to nausea and thrombocytopenia. Plan f/u bone marrow biopsy 03/2023 or when anxiety controlled.  Platelet count improved.    Anxiety Assessment & Plan: Continue f/u with Dr Lenis Noon.  Overall appears to be doing some better.  She is accompanied by her friend.  Discussed CCM referral as outlined.  Declines at this time.  Will notify me if changes her mind.    Chemotherapy-induced neuropathy (HCC) Assessment & Plan: Stable.    Chronic diastolic CHF (congestive heart failure) (HCC) Assessment & Plan: 2D echo on 09/05/2020 showed EF 60-65% with grade 1 diastolic dysfunction. No evidence of volume overload on exam.  Breathing stable.    Gastroesophageal reflux disease, unspecified whether esophagitis present Assessment & Plan: Persistent hoarseness.  On protonix.  Increase to bid and see if symptoms improve.  Follow.    Hematuria, unspecified type Assessment & Plan: In reviewing, last urine with 11-20 red blood cells.  Recheck urine.    History of breast cancer Assessment & Plan: Mammogram 07/29/21 - Birads II. Due f/u mammogram.  Have discussed. She declines mammogram.    History of thyroid cancer Assessment & Plan: Continue synthroid.  Follow tsh.    Hypercholesterolemia Assessment & Plan: Follow lipid panel.    Major depressive disorder, single episode, mild [facility]) Assessment & Plan: Seeing Dr Lenis Noon.  Currently on lexapro and clonazepam.  Also on amitriptyline. Overall improved.  Continue f/u with psychiatry.    Pancytopenia [facility]) Assessment & Plan: Hematology following. Receiving chemo.    Tremor Assessment & Plan: Feel multifactorial.  Exam as outlined.  TSH now wnl.  Have adjusted synthroid dose. Being followed by psychiatry.  On amitriptyline.  Doing better on current dose.  Plan neurology evaluation.    Hoarseness Assessment & Plan: Persistent.  Increase protonix as  outlined.  Dymista as directed.  Refer to ENT for further evaluation.   Orders: -     Ambulatory referral to ENT  Other orders -     Pantoprazole Sodium; Take 1 tablet (40 mg total) by mouth 2 (two) times daily before a meal.  Dispense: 180 tablet; Refill: 1     Dale Huntingdon, MD

## 2023-03-26 ENCOUNTER — Telehealth: Payer: Self-pay | Admitting: Internal Medicine

## 2023-03-26 NOTE — Telephone Encounter (Signed)
Pt called stating she talked to the provider about a nose spray and she stated she has little left. She want the medication called [facility] to [facility]

## 2023-03-28 ENCOUNTER — Encounter: Payer: Self-pay | Admitting: Internal Medicine

## 2023-03-28 DIAGNOSIS — R490 Dysphonia: Secondary | ICD-10-CM | POA: Insufficient documentation

## 2023-03-28 NOTE — Assessment & Plan Note (Signed)
Stable

## 2023-03-28 NOTE — Assessment & Plan Note (Signed)
Feel multifactorial.  Exam as outlined.  TSH now wnl.  Have adjusted synthroid dose. Being followed by psychiatry.  On amitriptyline.  Doing better on current dose.  Plan neurology evaluation.

## 2023-03-28 NOTE — Assessment & Plan Note (Signed)
Persistent.  Increase protonix as outlined.  Dymista as directed.  Refer to ENT for further evaluation.

## 2023-03-28 NOTE — Assessment & Plan Note (Signed)
Persistent hoarseness.  On protonix.  Increase to bid and see if symptoms improve.  Follow.

## 2023-03-28 NOTE — Assessment & Plan Note (Signed)
Mammogram 07/29/21 - Birads II. Due f/u mammogram.  Have discussed. She declines mammogram.

## 2023-03-28 NOTE — Assessment & Plan Note (Signed)
Continue synthroid. Follow tsh.

## 2023-03-28 NOTE — Assessment & Plan Note (Signed)
Follow lipid panel.

## 2023-03-28 NOTE — Assessment & Plan Note (Signed)
Continue f/u with Dr Lenis Noon.  Overall appears to be doing some better.  She is accompanied by her friend.  Discussed CCM referral as outlined.  Declines at this time.  Will notify me if changes her mind.

## 2023-03-28 NOTE — Assessment & Plan Note (Signed)
Seeing Dr Lenis Noon.  Currently on lexapro and clonazepam.  Also on amitriptyline. Overall improved.  Continue f/u with psychiatry.

## 2023-03-28 NOTE — Assessment & Plan Note (Signed)
Hematology following. Receiving chemo.

## 2023-03-28 NOTE — Assessment & Plan Note (Signed)
2D echo on 09/05/2020 showed EF 60-65% with grade 1 diastolic dysfunction. No evidence of volume overload on exam.  Breathing stable.

## 2023-03-28 NOTE — Assessment & Plan Note (Signed)
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  Dose reduced due to nausea and thrombocytopenia. Plan f/u bone marrow biopsy 03/2023 or when anxiety controlled.  Platelet count improved.

## 2023-03-28 NOTE — Assessment & Plan Note (Signed)
In reviewing, last urine with 11-20 red blood cells.  Recheck urine.

## 2023-03-29 ENCOUNTER — Telehealth: Payer: Self-pay

## 2023-03-29 ENCOUNTER — Other Ambulatory Visit: Payer: Self-pay

## 2023-03-29 ENCOUNTER — Other Ambulatory Visit (HOSPITAL_COMMUNITY): Payer: Self-pay

## 2023-03-29 MED ORDER — AZELASTINE-FLUTICASONE 137-50 MCG/ACT NA SUSP: NASAL | 2 refills | Status: AC

## 2023-03-29 NOTE — Telephone Encounter (Signed)
Nose spray sent to [facility].

## 2023-03-29 NOTE — Telephone Encounter (Signed)
*  Primary  Pharmacy Patient Advocate Encounter   Received notification from CoverMyMeds that prior authorization for Azelastine-Fluticasone 137-50MCG/ACT suspension  is required/requested.   Insurance verification completed.   The patient is insured through [facility] ADVANTAGE/RX ADVANCE .   Per test claim: PA required; PA submitted to above mentioned insurance via CoverMyMeds Key/confirmation #/EOC [facility] Status is pending

## 2023-04-05 ENCOUNTER — Telehealth: Payer: Self-pay

## 2023-04-05 ENCOUNTER — Encounter: Payer: Self-pay | Admitting: Oncology

## 2023-04-05 ENCOUNTER — Inpatient Hospital Stay (HOSPITAL_BASED_OUTPATIENT_CLINIC_OR_DEPARTMENT_OTHER): Payer: PPO | Admitting: Oncology

## 2023-04-05 ENCOUNTER — Inpatient Hospital Stay: Payer: PPO | Attending: Oncology

## 2023-04-05 VITALS — BP 131/81 | HR 109 | Temp 96.7°F | Wt 123.0 lb

## 2023-04-05 DIAGNOSIS — Z79624 Long term (current) use of inhibitors of nucleotide synthesis: Secondary | ICD-10-CM | POA: Insufficient documentation

## 2023-04-05 DIAGNOSIS — J040 Acute laryngitis: Secondary | ICD-10-CM

## 2023-04-05 DIAGNOSIS — Z79899 Other long term (current) drug therapy: Secondary | ICD-10-CM | POA: Insufficient documentation

## 2023-04-05 DIAGNOSIS — C9200 Acute myeloblastic leukemia, not having achieved remission: Secondary | ICD-10-CM

## 2023-04-05 DIAGNOSIS — R251 Tremor, unspecified: Secondary | ICD-10-CM | POA: Diagnosis not present

## 2023-04-05 DIAGNOSIS — F419 Anxiety disorder, unspecified: Secondary | ICD-10-CM | POA: Diagnosis not present

## 2023-04-05 LAB — CBC WITH DIFFERENTIAL/PLATELET
Abs Immature Granulocytes: 0.06 10*3/uL (ref 0.00–0.07)
Basophils Absolute: 0 10*3/uL (ref 0.0–0.1)
Basophils Relative: 0 %
Eosinophils Absolute: 0.1 10*3/uL (ref 0.0–0.5)
Eosinophils Relative: 1 %
HCT: 39 % (ref 36.0–46.0)
Hemoglobin: 13.7 g/dL (ref 12.0–15.0)
Immature Granulocytes: 1 %
Lymphocytes Relative: 13 %
Lymphs Abs: 0.7 10*3/uL (ref 0.7–4.0)
MCH: 34.9 pg — ABNORMAL HIGH (ref 26.0–34.0)
MCHC: 35.1 g/dL (ref 30.0–36.0)
MCV: 99.5 fL (ref 80.0–100.0)
Monocytes Absolute: 0.3 10*3/uL (ref 0.1–1.0)
Monocytes Relative: 6 %
Neutro Abs: 4 10*3/uL (ref 1.7–7.7)
Neutrophils Relative %: 79 %
Platelets: 132 10*3/uL — ABNORMAL LOW (ref 150–400)
RBC: 3.92 MIL/uL (ref 3.87–5.11)
RDW: 14.4 % (ref 11.5–15.5)
WBC: 5.1 10*3/uL (ref 4.0–10.5)
nRBC: 0 % (ref 0.0–0.2)

## 2023-04-05 LAB — COMPREHENSIVE METABOLIC PANEL
ALT: 43 U/L (ref 0–44)
AST: 18 U/L (ref 15–41)
Albumin: 4.2 g/dL (ref 3.5–5.0)
Alkaline Phosphatase: 59 U/L (ref 38–126)
Anion gap: 10 (ref 5–15)
BUN: 15 mg/dL (ref 8–23)
CO2: 27 mmol/L (ref 22–32)
Calcium: 8.8 mg/dL — ABNORMAL LOW (ref 8.9–10.3)
Chloride: 104 mmol/L (ref 98–111)
Creatinine, Ser: 0.94 mg/dL (ref 0.44–1.00)
GFR, Estimated: >60 mL/min (ref >60)
Glucose, Bld: 116 mg/dL — ABNORMAL HIGH (ref 70–99)
Potassium: 3.4 mmol/L — ABNORMAL LOW (ref 3.5–5.1)
Sodium: 141 mmol/L (ref 135–145)
Total Bilirubin: 0.7 mg/dL (ref <1.2)
Total Protein: 6.6 g/dL (ref 6.5–8.1)

## 2023-04-05 NOTE — Progress Notes (Signed)
[facility]  Telephone:(336) [phone] Fax:([phone]  ID: Debra Hale OB: 08-May-1950  MR#: [id number]  ION#:[id number]  Patient Care Team: Dale Offerman, MD as PCP - General (Internal Medicine) Iran Ouch, MD as PCP - Cardiology (Cardiology) Jeralyn Ruths, MD as Consulting Physician (Oncology)  CHIEF COMPLAINT: AML.  INTERVAL HISTORY: Patient returns to clinic today for further evaluation and continuation of treatment.  She continues to have increased anxiety and tremors which is being monitored and treated by her primary psychiatrist.  She also complains of persistent laryngitis.  She continues to take dose reduced Rezlidhia. She denies any pain.  She has no neurologic complaints.  She denies any recent fevers or illnesses.  She has a fair appetite, but denies weight loss.  She has no chest pain, shortness of breath, cough, or hemoptysis.  She denies any nausea, vomiting, constipation, or diarrhea.  Patient offers no further specific complaints today.  REVIEW OF SYSTEMS:   Review of Systems  Constitutional: Negative.  Negative for fever, malaise/fatigue and weight loss.  HENT:  Negative for congestion and sinus pain.   Respiratory: Negative.  Negative for cough, hemoptysis and shortness of breath.   Cardiovascular: Negative.  Negative for chest pain and leg swelling.  Gastrointestinal: Negative.  Negative for abdominal pain, diarrhea and nausea.  Genitourinary:  Negative for dysuria, frequency and urgency.  Musculoskeletal: Negative.  Negative for back pain and falls.  Skin: Negative.  Negative for rash.  Neurological:  Positive for tremors. Negative for dizziness, speech change, focal weakness, weakness and headaches.  Psychiatric/Behavioral:  Negative for memory loss. The patient is nervous/anxious.     As per HPI. Otherwise, a complete review of systems is negative.  PAST MEDICAL HISTORY: Past Medical History:  Diagnosis Date   Abdominal  pain 03/06/2016   Acute pericarditis 05/31/2013   Anxiety    Arthritis    Osteoarthritis   Breast cancer (HCC) 2009   right breast lumpectomy with rad tx   Colon cancer (HCC)    surgery with chemo and rad tx   Complication of anesthesia    GERD (gastroesophageal reflux disease)    Heart palpitations    History of hiatal hernia    History of kidney stones    History of thyroid cancer 09/18/2008   Qualifier: Diagnosis of   By: Denyse Amass, CMA, Carol       Hypothyroidism    Liver disease    Liver nodule    s/p negative biopsy   Malignant neoplasm of thyroid gland (HCC) 2002   s/p surgery and XRT   Osteoporosis    Other and unspecified hyperlipidemia    Palpitations    Personal history of chemotherapy    Personal history of malignant neoplasm of large intestine    carcinoma - cecum, s/p right laparoscopic colectomy - s/p chemotherapy and XRT   Personal history of radiation therapy    Pneumonia 2019   PONV (postoperative nausea and vomiting)    Pure hypercholesterolemia    Unspecified hereditary and idiopathic peripheral neuropathy     PAST SURGICAL HISTORY: Past Surgical History:  Procedure Laterality Date   APPENDECTOMY  1985   BREAST BIOPSY Right 2009   positive   BREAST BIOPSY Right 2009   negative   BREAST LUMPECTOMY Right 2009   breast cancer   CHOLECYSTECTOMY  1995   COLONOSCOPY     COLONOSCOPY WITH PROPOFOL N/A 10/29/2017   Procedure: COLONOSCOPY WITH PROPOFOL;  Surgeon: Scot Jun, MD;  Location:  [facility] ENDOSCOPY;  Service: Endoscopy;  Laterality: N/A;   DILATION AND CURETTAGE OF UTERUS  1990   DILATION AND CURETTAGE, DIAGNOSTIC / THERAPEUTIC  1990   ESOPHAGOGASTRODUODENOSCOPY     ESOPHAGOGASTRODUODENOSCOPY (EGD) WITH PROPOFOL N/A 10/29/2017   Procedure: ESOPHAGOGASTRODUODENOSCOPY (EGD) WITH PROPOFOL;  Surgeon: Scot Jun, MD;  Location: [facility] ENDOSCOPY;  Service: Endoscopy;  Laterality: N/A;   INCISION AND DRAINAGE PERIRECTAL ABSCESS N/A 06/17/2020    Procedure: IRRIGATION AND DEBRIDEMENT PERIRECTAL ABSCESS;  Surgeon: Leafy Ro, MD;  Location: [facility] ORS;  Service: General;  Laterality: N/A;   IR BONE MARROW BIOPSY & ASPIRATION  07/09/2021   LAPAROSCOPIC PARTIAL COLECTOMY     stage 3-C carcinoma of the cecum, s/p chemotherapy and xrt   LITHOTRIPSY     SIGMOIDOSCOPY  08/26/1993   THYROID LOBECTOMY  2002   s/p XRT   TOTAL HIP ARTHROPLASTY Right 10/24/2019   Procedure: TOTAL HIP ARTHROPLASTY;  Surgeon: Christena Flake, MD;  Location: [facility] ORS;  Service: Orthopedics;  Laterality: Right;    FAMILY HISTORY: Family History  Problem Relation Age of Onset   Stroke Mother        10s   Alzheimer's disease Mother    Lung cancer Father    Prostate cancer Father    Cancer Father        Colon   Colon cancer Father    Breast cancer Sister        23's   Lung cancer Sister    Breast cancer Maternal Aunt     ADVANCED DIRECTIVES (Y/N):  N  HEALTH MAINTENANCE: Social History   Tobacco Use   Smoking status: Never   Smokeless tobacco: Never  Vaping Use   Vaping status: Never Used  Substance Use Topics   Alcohol use: No    Alcohol/week: 0.0 standard drinks of alcohol   Drug use: No     Colonoscopy:  PAP:  Bone density:  Lipid panel:  Allergies  Allergen Reactions   Demeclocycline Other (See Comments)    Throat swells   Sulfa Antibiotics Other (See Comments)    Other reaction(s): Other (See Comments) Throat swells Other reaction(s): Other (See Comments) Throat swells Throat swells Other reaction(s): Other (See Comments) Throat swells Other reaction(s): Unknown Other reaction(s): Other (See Comments) Throat swells Other reaction(s): Other (See Comments) Other reaction(s): Other (See Comments) Throat swellsOther reaction(s): Other (See Comments) Throat swells   Tetracyclines & Related Other (See Comments)    Throat swells   Chlordiazepoxide    Dicyclomine    Levofloxacin Diarrhea    Severe diarrhea   Augmentin  [Amoxicillin-Pot Clavulanate] Diarrhea   Bentyl [Dicyclomine Hcl]     unkn   Ciprofloxacin Diarrhea   Codeine Other (See Comments)    dizziness     Dicyclomine Hcl Other (See Comments)    unkn   Epinephrine     Speeds heart up - panic    Flagyl [Metronidazole] Nausea And Vomiting   Librax [Chlordiazepoxide-Clidinium]     unkn   Novocain [Procaine] Other (See Comments)    "Shaky"   Phenobarbital     unkn   Prednisone     Nervous     Tramadol Other (See Comments)    Sick feeling    Current Outpatient Medications  Medication Sig Dispense Refill   amitriptyline (ELAVIL) 10 MG tablet Take 10 mg by mouth in the morning and at bedtime.     Azelastine-Fluticasone 137-50 MCG/ACT SUSP SPRAY 2 SPRAYS INTO EACH NOSTRIL EVERY DAY 23  g 2   clonazePAM (KLONOPIN) 0.5 MG tablet Take 1 tablet (0.5 mg total) by mouth 3 (three) times daily as needed for anxiety. 30 tablet 0   escitalopram (LEXAPRO) 20 MG tablet Take 20 mg by mouth every morning.      levothyroxine (SYNTHROID) 88 MCG tablet Take 1 tablet (88 mcg total) by mouth daily. 90 tablet 1   olutasidenib (REZLIDHIA) 150 MG capsule Take 1 capsule (150 mg total) by mouth daily. Take on an empty stomach at least 1 hour before or 2 hours after a meal. 30 capsule 2   pantoprazole (PROTONIX) 40 MG tablet Take 1 tablet (40 mg total) by mouth 2 (two) times daily before a meal. 180 tablet 1   potassium chloride (KLOR-CON M) 10 MEQ tablet Take 1 tablet (10 mEq total) by mouth 2 (two) times daily. 60 tablet 2   prochlorperazine (COMPAZINE) 10 MG tablet Take 1 tablet (10 mg total) by mouth every 6 (six) hours as needed for nausea or vomiting. 30 tablet 3   valACYclovir (VALTREX) 500 MG tablet TAKE ONE TABLET BY MOUTH DAILY 30 tablet 2   No current facility-administered medications for this visit.    OBJECTIVE: Vitals:   04/05/23 1058  BP: 131/81  Pulse: (!) 109  Temp: (!) 96.7 F (35.9 C)  SpO2: 99%     Body mass index is 21.11 kg/m.    ECOG  FS:0 - Asymptomatic  General: Well-developed, well-nourished, no acute distress. Eyes: Pink conjunctiva, anicteric sclera. HEENT: Normocephalic, moist mucous membranes.  No lymphadenopathy. Lungs: No audible wheezing or coughing. Heart: Regular rate and rhythm. Abdomen: Soft, nontender, no obvious distention. Musculoskeletal: No edema, cyanosis, or clubbing. Neuro: Alert, answering all questions appropriately. Cranial nerves grossly intact. Skin: No rashes or petechiae noted. Psych: Normal affect.  LAB RESULTS:  Lab Results  Component Value Date   NA 141 04/05/2023   K 3.4 (L) 04/05/2023   CL 104 04/05/2023   CO2 27 04/05/2023   GLUCOSE 116 (H) 04/05/2023   BUN 15 04/05/2023   CREATININE 0.94 04/05/2023   CALCIUM 8.8 (L) 04/05/2023   PROT 6.6 04/05/2023   ALBUMIN 4.2 04/05/2023   AST 18 04/05/2023   ALT 43 04/05/2023   ALKPHOS 59 04/05/2023   BILITOT 0.7 04/05/2023   GFRNONAA >60 04/05/2023   GFRAA 54 (L) 10/26/2019    Lab Results  Component Value Date   WBC 5.1 04/05/2023   NEUTROABS 4.0 04/05/2023   HGB 13.7 04/05/2023   HCT 39.0 04/05/2023   MCV 99.5 04/05/2023   PLT 132 (L) 04/05/2023     STUDIES: No results found.   ASSESSMENT: AML.  PLAN:    AML: Confirmed by bone marrow biopsy.  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  Patient's most recent bone marrow biopsy on March 23, 2022 revealed an increase of her blast count from 4% previously to up to 15% currently.  She last received treatment with cycle 22 of Vidaza on February 13, 2022.  Given her progressive disease and  IDH-1 mutation, Vidaza was discontinued and patient initially received 150 mg twice daily of Olutasidenib (Rezlidhia).  This has been now dose reduce to once per day secondary to nausea and thrombocytopenia.  Patient will require repeat bone marrow biopsy in the near  future, but given her psychiatric and anxiety issues, this may be delayed until her medication regimen is optimized by her  psychiatrist.  No intervention is needed at this time.  Return to clinic in 4 weeks for laboratory work and evaluation by clinical pharmacy.  Patient will then return to clinic in 8 weeks for laboratory work, further evaluation, and continuation of treatment.    Thrombocytopenia: Chronic and unchanged.  Patient's platelet count is 132 today.  Continue dose reduced treatment once per day as above. Diarrhea: Resolved.  Continue Lomotil as needed.   Nausea: Improved with dose reduction.  Continue ondansetron and Compazine as needed.   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  Patient also received chemotherapy and likely received Adriamycin, but exact regimen is unknown.  She completed 5 years of hormonal therapy in approximately June 2015. Currently, she has no evidence of disease.  Her most recent mammogram on July 29, 2021 was reported as BI-RADS 2.   History of colon cancer: Patient also states that she received chemotherapy for this, possibly FOLFOX but again this is unknown. Hair thinning: Improving.  Likely related to thyroid dosing. Psychiatric/anxiety/tremor: Patient now on amitriptyline.  Continue follow-up and treatment per primary psychiatrist. Laryngitis: Patient was given a referral to ENT.   Patient expressed understanding and was in agreement with this plan. She also understands that She can call clinic at any time with any questions, concerns, or complaints.    Cancer Staging  History of breast cancer Staging form: Breast, AJCC 7th Edition - Clinical stage from 01/07/2016: Stage IA (T1c, N0, M0) - Signed by Jeralyn Ruths, MD on 01/07/2016 Laterality: Right Estrogen receptor status:  Positive Progesterone receptor status: Positive HER2 status: Negative   Jeralyn Ruths, MD   04/05/2023 4:33 PM

## 2023-04-05 NOTE — Telephone Encounter (Signed)
Referral faxed over to [facility] for persistent laryngitis

## 2023-04-06 ENCOUNTER — Ambulatory Visit: Payer: PPO | Admitting: Internal Medicine

## 2023-04-15 ENCOUNTER — Other Ambulatory Visit (HOSPITAL_COMMUNITY): Payer: Self-pay

## 2023-04-16 ENCOUNTER — Other Ambulatory Visit (HOSPITAL_COMMUNITY): Payer: Self-pay

## 2023-04-16 ENCOUNTER — Other Ambulatory Visit: Payer: Self-pay | Admitting: Internal Medicine

## 2023-04-16 NOTE — Telephone Encounter (Signed)
Pharmacy Patient Advocate Encounter  Received notification from [employer] [facility] [employer] that Prior Authorization for Azelastine-Fluticasone 137-50MCG/ACT suspension  has been DENIED.  See denial reason below. No denial letter attached in CMM. Will attach denial letter to Media tab once received.   PA #/Case ID/Reference #: Medication is not on formulary. May prescribe as separate sprays which are both covered at no cost at this time.

## 2023-04-19 NOTE — Telephone Encounter (Signed)
Refilled: 01/28/2023 Last OV: 03/25/2023 Next OV: 06/07/2023 Last potassium lab on 04/05/2023 was 3.4

## 2023-04-20 NOTE — Telephone Encounter (Signed)
Called patient she has already picked up medication

## 2023-04-26 ENCOUNTER — Other Ambulatory Visit: Payer: Self-pay

## 2023-04-26 NOTE — Progress Notes (Signed)
Specialty Pharmacy Refill Coordination Note  Debra Hale is a 72 y.o. female contacted today regarding refills of specialty medication(s) Olutasidenib Tamala Fothergill)   Patient requested Delivery   Delivery date: 05/04/23   Verified address: [address]   Medication will be filled on 05/03/23.

## 2023-05-03 ENCOUNTER — Other Ambulatory Visit: Payer: Self-pay

## 2023-05-03 ENCOUNTER — Inpatient Hospital Stay: Payer: PPO | Admitting: Pharmacist

## 2023-05-03 ENCOUNTER — Inpatient Hospital Stay: Payer: PPO

## 2023-05-03 ENCOUNTER — Encounter: Payer: Self-pay | Admitting: Pharmacist

## 2023-05-03 VITALS — BP 114/78 | HR 107 | Temp 97.6°F | Resp 18 | Wt 124.0 lb

## 2023-05-03 DIAGNOSIS — C9200 Acute myeloblastic leukemia, not having achieved remission: Secondary | ICD-10-CM | POA: Diagnosis not present

## 2023-05-03 LAB — CBC WITH DIFFERENTIAL/PLATELET
Abs Immature Granulocytes: 0.25 10*3/uL — ABNORMAL HIGH (ref 0.00–0.07)
Basophils Absolute: 0 10*3/uL (ref 0.0–0.1)
Basophils Relative: 1 %
Eosinophils Absolute: 0.1 10*3/uL (ref 0.0–0.5)
Eosinophils Relative: 2 %
HCT: 37.7 % (ref 36.0–46.0)
Hemoglobin: 13.4 g/dL (ref 12.0–15.0)
Immature Granulocytes: 5 %
Lymphocytes Relative: 11 %
Lymphs Abs: 0.6 10*3/uL — ABNORMAL LOW (ref 0.7–4.0)
MCH: 35.9 pg — ABNORMAL HIGH (ref 26.0–34.0)
MCHC: 35.5 g/dL (ref 30.0–36.0)
MCV: 101.1 fL — ABNORMAL HIGH (ref 80.0–100.0)
Monocytes Absolute: 0.3 10*3/uL (ref 0.1–1.0)
Monocytes Relative: 5 %
Neutro Abs: 4.3 10*3/uL (ref 1.7–7.7)
Neutrophils Relative %: 76 %
Platelets: 122 10*3/uL — ABNORMAL LOW (ref 150–400)
RBC: 3.73 MIL/uL — ABNORMAL LOW (ref 3.87–5.11)
RDW: 14.6 % (ref 11.5–15.5)
WBC: 5.6 10*3/uL (ref 4.0–10.5)
nRBC: 0 % (ref 0.0–0.2)

## 2023-05-03 LAB — COMPREHENSIVE METABOLIC PANEL
ALT: 43 U/L (ref 0–44)
AST: 22 U/L (ref 15–41)
Albumin: 4 g/dL (ref 3.5–5.0)
Alkaline Phosphatase: 58 U/L (ref 38–126)
Anion gap: 9 (ref 5–15)
BUN: 15 mg/dL (ref 8–23)
CO2: 29 mmol/L (ref 22–32)
Calcium: 9 mg/dL (ref 8.9–10.3)
Chloride: 103 mmol/L (ref 98–111)
Creatinine, Ser: 0.98 mg/dL (ref 0.44–1.00)
GFR, Estimated: >60 mL/min (ref >60)
Glucose, Bld: 137 mg/dL — ABNORMAL HIGH (ref 70–99)
Potassium: 3.6 mmol/L (ref 3.5–5.1)
Sodium: 141 mmol/L (ref 135–145)
Total Bilirubin: 0.9 mg/dL (ref 0.0–1.2)
Total Protein: 6.9 g/dL (ref 6.5–8.1)

## 2023-05-03 NOTE — Progress Notes (Signed)
Oral Chemotherapy Clinic [facility]  Telephone:([phone] Fax:(336) [phone]  Patient Care Team: Dale [place], MD as PCP - General (Internal Medicine) Iran Ouch, MD as PCP - Cardiology (Cardiology) Jeralyn Ruths, MD as Consulting Physician (Oncology)   Name of the patient: Debra Hale  [id number]  [birth year]   Encounter Date 05/03/2023  HPI: Patient is a 72 y.o. female with AML. Previously treated with azacitadine and venetoclax. A repeat BM biopsy showed disease progression. 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 She was dose reduced to olutasidenib 100mg  daily on 06/22/22 due to decreased pltc.   Reason for Consult: Oral chemotherapy follow-up for Rezlidhia (olutasidenib) therapy.   PAST MEDICAL HISTORY: Past Medical History:  Diagnosis Date   Abdominal pain 03/06/2016   Acute pericarditis 05/31/2013   Anxiety    Arthritis    Osteoarthritis   Breast cancer (HCC) 2009   right breast lumpectomy with rad tx   Colon cancer (HCC)    surgery with chemo and rad tx   Complication of anesthesia    GERD (gastroesophageal reflux disease)    Heart palpitations    History of hiatal hernia    History of kidney stones    History of thyroid cancer 09/18/2008   Qualifier: Diagnosis of   By: Denyse Amass CMA, Carol       Hypothyroidism    Liver disease    Liver nodule    s/p negative biopsy   Malignant neoplasm of thyroid gland (HCC) 2002   s/p surgery and XRT   Osteoporosis    Other and unspecified hyperlipidemia    Palpitations    Personal history of chemotherapy    Personal history of malignant neoplasm of large intestine    carcinoma - cecum, s/p right laparoscopic colectomy - s/p chemotherapy and XRT   Personal history of radiation therapy    Pneumonia 2019   PONV (postoperative nausea and vomiting)    Pure hypercholesterolemia    Unspecified hereditary and idiopathic peripheral  neuropathy     HEMATOLOGY/ONCOLOGY HISTORY:  Oncology History  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 Initial Diagnosis   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HCC)   06/10/2020 - 12/19/2021 Chemotherapy   Patient is on Treatment Plan : AML dose reduced azacitidine SQ D1-5 q28d     06/10/2020 - 02/13/2022 Chemotherapy   Patient is on Treatment Plan : AML Azacitidine SQ D1-5 q28d       ALLERGIES:  is allergic to demeclocycline, sulfa antibiotics, tetracyclines & related, chlordiazepoxide, dicyclomine, levofloxacin, augmentin [amoxicillin-pot clavulanate], bentyl [dicyclomine hcl], ciprofloxacin, codeine, dicyclomine hcl, epinephrine, flagyl [metronidazole], librax [chlordiazepoxide-clidinium], novocain [procaine], phenobarbital, prednisone, and tramadol.  MEDICATIONS:  Current Outpatient Medications  Medication Sig Dispense Refill   amitriptyline (ELAVIL) 10 MG tablet Take 10 mg by mouth in the morning and at bedtime.     Azelastine-Fluticasone 137-50 MCG/ACT SUSP SPRAY 2 SPRAYS INTO EACH NOSTRIL EVERY DAY 23 g 2   clonazePAM (KLONOPIN) 0.5 MG tablet Take 1 tablet (0.5 mg total) by mouth 3 (three) times daily as needed for anxiety. 30 tablet 0   escitalopram (LEXAPRO) 20 MG tablet Take 20 mg by mouth every morning.      levothyroxine (SYNTHROID) 88 MCG tablet Take 1 tablet (88 mcg total) by mouth daily. 90 tablet 1   olutasidenib (REZLIDHIA) 150 MG capsule Take 1 capsule (150 mg total) by mouth daily. Take on an empty stomach at least 1 hour before or 2 hours after  a meal. 30 capsule 2   pantoprazole (PROTONIX) 40 MG tablet Take 1 tablet (40 mg total) by mouth 2 (two) times daily before a meal. 180 tablet 1   potassium chloride (KLOR-CON M) 10 MEQ tablet TAKE ONE TABLET BY MOUTH TWICE DAILY 60 tablet 2   prochlorperazine (COMPAZINE) 10 MG tablet Take 1 tablet (10 mg total) by mouth every 6 (six) hours as needed for nausea or vomiting. 30 tablet 3   valACYclovir (VALTREX) 500 MG  tablet TAKE ONE TABLET BY MOUTH DAILY 30 tablet 2   No current facility-administered medications for this visit.    VITAL SIGNS: BP 114/78 (BP Location: Left Arm, Patient Position: Sitting)   Pulse (!) 107   Temp 97.6 F (36.4 C) (Tympanic)   Resp 18   Wt 56.2 kg (124 lb)   SpO2 96%   BMI 21.28 kg/m  Filed Weights   05/03/23 1100  Weight: 56.2 kg (124 lb)      Estimated body mass index is 21.28 kg/m as calculated from the following:   Height as of 03/25/23: 5\' 4"  (1.626 m).   Weight as of this encounter: 56.2 kg (124 lb).  LABS: CBC:    Component Value Date/Time   WBC 5.6 05/03/2023 1019   HGB 13.4 05/03/2023 1019   HGB 12.5 05/09/2018 1147   HCT 37.7 05/03/2023 1019   HCT 36.8 05/09/2018 1147   PLT 122 (L) 05/03/2023 1019   PLT 285 05/09/2018 1147   MCV 101.1 (H) 05/03/2023 1019   MCV 86 05/09/2018 1147   MCV 89 11/30/2013 1419   NEUTROABS 4.3 05/03/2023 1019   NEUTROABS 2.9 11/20/2013 1404   LYMPHSABS 0.6 (L) 05/03/2023 1019   LYMPHSABS 0.9 (L) 11/20/2013 1404   MONOABS 0.3 05/03/2023 1019   MONOABS 0.2 11/20/2013 1404   EOSABS 0.1 05/03/2023 1019   EOSABS 0.1 11/20/2013 1404   BASOSABS 0.0 05/03/2023 1019   BASOSABS 0.0 11/20/2013 1404   Comprehensive Metabolic Panel:    Component Value Date/Time   NA 141 05/03/2023 1019   NA 143 05/09/2018 1147   NA 145 11/30/2013 1419   K 3.6 05/03/2023 1019   K 3.5 11/30/2013 1419   CL 103 05/03/2023 1019   CL 109 (H) 11/30/2013 1419   CO2 29 05/03/2023 1019   CO2 29 11/30/2013 1419   BUN 15 05/03/2023 1019   BUN 11 05/09/2018 1147   BUN 15 11/30/2013 1419   CREATININE 0.98 05/03/2023 1019   CREATININE 1.04 11/30/2013 1419   GLUCOSE 137 (H) 05/03/2023 1019   GLUCOSE 97 11/30/2013 1419   CALCIUM 9.0 05/03/2023 1019   CALCIUM 8.7 11/30/2013 1419   AST 22 05/03/2023 1019   AST 28 11/30/2013 1419   ALT 43 05/03/2023 1019   ALT 71 (H) 11/30/2013 1419   ALKPHOS 58 05/03/2023 1019   ALKPHOS 114 11/30/2013  1419   BILITOT 0.9 05/03/2023 1019   BILITOT 0.7 11/30/2013 1419   PROT 6.9 05/03/2023 1019   PROT 7.1 11/30/2013 1419   ALBUMIN 4.0 05/03/2023 1019   ALBUMIN 3.3 (L) 11/30/2013 1419     Present during today's visit: patient only  Assessment and Plan: CMP/CBC reviewed, continue reduced dose olutasidenib 150mg  daily  Patient reports feeling less "jittery" since her medication changes by her psychiatrist, but still feeling anxious off and on.  Of note, patient was initially delaying repeat bone marrow biopsy due to inability to lay for the procedure due to anxiety/jitters. She now wants to wait until  after her ENT referral (for hoarseness) at the beginning of January. She will let Dr. Orlie Dakin know at next office visit if she is okay to proceed with biopsy.    Oral Chemotherapy Adherence: No missed doses reported There are no patient barriers to medication adherence identified.   New medications: None reported   Medication Access Issues: No issues, patient fills at [facility].   Patient expressed understanding and was in agreement with this plan. She also understands that She can call clinic at any time with any questions, concerns, or complaints.   Follow-up plan: RTC in 4 weeks lab/MD.   Thank you for allowing me to participate in the care of this very pleasant patient.   Time Total: 15 minutes  Visit consisted of counseling and education on dealing with issues of symptom management in the setting of serious and potentially life-threatening illness.Greater than 50%  of this time was spent counseling and coordinating care related to the above assessment and plan.  Signed by: Remi Haggard, PharmD, BCPS, Nolon Bussing, CPP Hematology/Oncology Clinical Pharmacist Practitioner [place]/[facility]/AP Oral Chemotherapy Navigation [facility] [phone]  05/03/2023 11:29 AM

## 2023-05-13 ENCOUNTER — Other Ambulatory Visit (HOSPITAL_COMMUNITY): Payer: Self-pay

## 2023-05-13 ENCOUNTER — Other Ambulatory Visit: Payer: Self-pay | Admitting: Pharmacist

## 2023-05-13 DIAGNOSIS — C9200 Acute myeloblastic leukemia, not having achieved remission: Secondary | ICD-10-CM

## 2023-05-17 DIAGNOSIS — R258 Other abnormal involuntary movements: Secondary | ICD-10-CM | POA: Diagnosis not present

## 2023-05-17 DIAGNOSIS — R2689 Other abnormalities of gait and mobility: Secondary | ICD-10-CM | POA: Diagnosis not present

## 2023-05-17 DIAGNOSIS — R251 Tremor, unspecified: Secondary | ICD-10-CM | POA: Diagnosis not present

## 2023-05-17 DIAGNOSIS — R498 Other voice and resonance disorders: Secondary | ICD-10-CM | POA: Diagnosis not present

## 2023-05-17 DIAGNOSIS — R531 Weakness: Secondary | ICD-10-CM | POA: Diagnosis not present

## 2023-05-19 ENCOUNTER — Other Ambulatory Visit: Payer: Self-pay

## 2023-05-31 ENCOUNTER — Encounter: Payer: Self-pay | Admitting: Oncology

## 2023-05-31 ENCOUNTER — Inpatient Hospital Stay: Payer: PPO | Attending: Oncology

## 2023-05-31 ENCOUNTER — Inpatient Hospital Stay (HOSPITAL_BASED_OUTPATIENT_CLINIC_OR_DEPARTMENT_OTHER): Payer: PPO | Admitting: Oncology

## 2023-05-31 VITALS — BP 114/74 | HR 95 | Temp 96.5°F | Resp 18 | Ht 64.0 in | Wt 123.6 lb

## 2023-05-31 DIAGNOSIS — F419 Anxiety disorder, unspecified: Secondary | ICD-10-CM | POA: Diagnosis not present

## 2023-05-31 DIAGNOSIS — R110 Nausea: Secondary | ICD-10-CM | POA: Insufficient documentation

## 2023-05-31 DIAGNOSIS — Z801 Family history of malignant neoplasm of trachea, bronchus and lung: Secondary | ICD-10-CM | POA: Diagnosis not present

## 2023-05-31 DIAGNOSIS — J040 Acute laryngitis: Secondary | ICD-10-CM | POA: Insufficient documentation

## 2023-05-31 DIAGNOSIS — C9200 Acute myeloblastic leukemia, not having achieved remission: Secondary | ICD-10-CM

## 2023-05-31 DIAGNOSIS — R251 Tremor, unspecified: Secondary | ICD-10-CM | POA: Diagnosis not present

## 2023-05-31 DIAGNOSIS — D696 Thrombocytopenia, unspecified: Secondary | ICD-10-CM | POA: Diagnosis not present

## 2023-05-31 DIAGNOSIS — Z853 Personal history of malignant neoplasm of breast: Secondary | ICD-10-CM | POA: Insufficient documentation

## 2023-05-31 DIAGNOSIS — Z800 Family history of malignant neoplasm of digestive organs: Secondary | ICD-10-CM | POA: Insufficient documentation

## 2023-05-31 DIAGNOSIS — Z803 Family history of malignant neoplasm of breast: Secondary | ICD-10-CM | POA: Diagnosis not present

## 2023-05-31 LAB — CBC WITH DIFFERENTIAL/PLATELET
Abs Immature Granulocytes: 0.07 10*3/uL (ref 0.00–0.07)
Basophils Absolute: 0 10*3/uL (ref 0.0–0.1)
Basophils Relative: 1 %
Eosinophils Absolute: 0.1 10*3/uL (ref 0.0–0.5)
Eosinophils Relative: 2 %
HCT: 37.2 % (ref 36.0–46.0)
Hemoglobin: 13.1 g/dL (ref 12.0–15.0)
Immature Granulocytes: 2 %
Lymphocytes Relative: 17 %
Lymphs Abs: 0.7 10*3/uL (ref 0.7–4.0)
MCH: 36 pg — ABNORMAL HIGH (ref 26.0–34.0)
MCHC: 35.2 g/dL (ref 30.0–36.0)
MCV: 102.2 fL — ABNORMAL HIGH (ref 80.0–100.0)
Monocytes Absolute: 0.3 10*3/uL (ref 0.1–1.0)
Monocytes Relative: 6 %
Neutro Abs: 3 10*3/uL (ref 1.7–7.7)
Neutrophils Relative %: 72 %
Platelets: 131 10*3/uL — ABNORMAL LOW (ref 150–400)
RBC: 3.64 MIL/uL — ABNORMAL LOW (ref 3.87–5.11)
RDW: 14.3 % (ref 11.5–15.5)
WBC: 4.1 10*3/uL (ref 4.0–10.5)
nRBC: 0 % (ref 0.0–0.2)

## 2023-05-31 LAB — COMPREHENSIVE METABOLIC PANEL
ALT: 15 U/L (ref 0–44)
AST: 18 U/L (ref 15–41)
Albumin: 4.3 g/dL (ref 3.5–5.0)
Alkaline Phosphatase: 54 U/L (ref 38–126)
Anion gap: 7 (ref 5–15)
BUN: 17 mg/dL (ref 8–23)
CO2: 27 mmol/L (ref 22–32)
Calcium: 8.9 mg/dL (ref 8.9–10.3)
Chloride: 105 mmol/L (ref 98–111)
Creatinine, Ser: 0.93 mg/dL (ref 0.44–1.00)
GFR, Estimated: >60 mL/min (ref >60)
Glucose, Bld: 142 mg/dL — ABNORMAL HIGH (ref 70–99)
Potassium: 4 mmol/L (ref 3.5–5.1)
Sodium: 139 mmol/L (ref 135–145)
Total Bilirubin: 0.8 mg/dL (ref 0.0–1.2)
Total Protein: 6.6 g/dL (ref 6.5–8.1)

## 2023-05-31 NOTE — Progress Notes (Unsigned)
[facility]  Telephone:([phone] Fax:([phone]  ID: VERNEL DONLAN OB: May 14, 1950  MR#: [id number]  VHQ#:[id number]  Patient Care Team: Dale [place], MD as PCP - General (Internal Medicine) Iran Ouch, MD as PCP - Cardiology (Cardiology) Jeralyn Ruths, MD as Consulting Physician (Oncology)  CHIEF COMPLAINT: AML.  INTERVAL HISTORY: Patient returns to clinic today for further evaluation and continuation of treatment.  She continues to have increased anxiety and tremors, but her medications are actively being adjusted by her primary psychiatrist.  She also has a persistent laryngitis, but was unable to make her ENT appointment recently.  Patient returns to clinic today for further evaluation and continuation of treatment.  She continues to take and tolerate dose reduced Rezlidhia. She denies any pain.  She has no other neurologic complaints.  She denies any recent fevers or illnesses.  She has a fair appetite, but denies weight loss.  She has no chest pain, shortness of breath, cough, or hemoptysis.  She denies any nausea, vomiting, constipation, or diarrhea.  Patient offers no further specific complaints today.  REVIEW OF SYSTEMS:   Review of Systems  Constitutional: Negative.  Negative for fever, malaise/fatigue and weight loss.  HENT:  Negative for congestion and sinus pain.   Respiratory: Negative.  Negative for cough, hemoptysis and shortness of breath.   Cardiovascular: Negative.  Negative for chest pain and leg swelling.  Gastrointestinal: Negative.  Negative for abdominal pain, diarrhea and nausea.  Genitourinary:  Negative for dysuria, frequency and urgency.  Musculoskeletal: Negative.  Negative for back pain and falls.  Skin: Negative.  Negative for rash.  Neurological:  Positive for tremors. Negative for dizziness, speech change, focal weakness, weakness and headaches.  Psychiatric/Behavioral:  Negative for memory loss. The patient is  nervous/anxious.     As per HPI. Otherwise, a complete review of systems is negative.  PAST MEDICAL HISTORY: Past Medical History:  Diagnosis Date   Abdominal pain 03/06/2016   Acute pericarditis 05/31/2013   Anxiety    Arthritis    Osteoarthritis   Breast cancer (HCC) 2009   right breast lumpectomy with rad tx   Colon cancer (HCC)    surgery with chemo and rad tx   Complication of anesthesia    GERD (gastroesophageal reflux disease)    Heart palpitations    History of hiatal hernia    History of kidney stones    History of thyroid cancer 09/18/2008   Qualifier: Diagnosis of   By: Denyse Amass, CMA, Carol       Hypothyroidism    Liver disease    Liver nodule    s/p negative biopsy   Malignant neoplasm of thyroid gland (HCC) 2002   s/p surgery and XRT   Osteoporosis    Other and unspecified hyperlipidemia    Palpitations    Personal history of chemotherapy    Personal history of malignant neoplasm of large intestine    carcinoma - cecum, s/p right laparoscopic colectomy - s/p chemotherapy and XRT   Personal history of radiation therapy    Pneumonia 2019   PONV (postoperative nausea and vomiting)    Pure hypercholesterolemia    Unspecified hereditary and idiopathic peripheral neuropathy     PAST SURGICAL HISTORY: Past Surgical History:  Procedure Laterality Date   APPENDECTOMY  1985   BREAST BIOPSY Right 2009   positive   BREAST BIOPSY Right 2009   negative   BREAST LUMPECTOMY Right 2009   breast cancer   CHOLECYSTECTOMY  1995  COLONOSCOPY     COLONOSCOPY WITH PROPOFOL N/A 10/29/2017   Procedure: COLONOSCOPY WITH PROPOFOL;  Surgeon: Scot Jun, MD;  Location: [facility] ENDOSCOPY;  Service: Endoscopy;  Laterality: N/A;   DILATION AND CURETTAGE OF UTERUS  1990   DILATION AND CURETTAGE, DIAGNOSTIC / THERAPEUTIC  1990   ESOPHAGOGASTRODUODENOSCOPY     ESOPHAGOGASTRODUODENOSCOPY (EGD) WITH PROPOFOL N/A 10/29/2017   Procedure: ESOPHAGOGASTRODUODENOSCOPY (EGD) WITH  PROPOFOL;  Surgeon: Scot Jun, MD;  Location: [facility] ENDOSCOPY;  Service: Endoscopy;  Laterality: N/A;   INCISION AND DRAINAGE PERIRECTAL ABSCESS N/A 06/17/2020   Procedure: IRRIGATION AND DEBRIDEMENT PERIRECTAL ABSCESS;  Surgeon: Leafy Ro, MD;  Location: [facility] ORS;  Service: General;  Laterality: N/A;   IR BONE MARROW BIOPSY & ASPIRATION  07/09/2021   LAPAROSCOPIC PARTIAL COLECTOMY     stage 3-C carcinoma of the cecum, s/p chemotherapy and xrt   LITHOTRIPSY     SIGMOIDOSCOPY  08/26/1993   THYROID LOBECTOMY  2002   s/p XRT   TOTAL HIP ARTHROPLASTY Right 10/24/2019   Procedure: TOTAL HIP ARTHROPLASTY;  Surgeon: Christena Flake, MD;  Location: [facility] ORS;  Service: Orthopedics;  Laterality: Right;    FAMILY HISTORY: Family History  Problem Relation Age of Onset   Stroke Mother        49s   Alzheimer's disease Mother    Lung cancer Father    Prostate cancer Father    Cancer Father        Colon   Colon cancer Father    Breast cancer Sister        60's   Lung cancer Sister    Breast cancer Maternal Aunt     ADVANCED DIRECTIVES (Y/N):  N  HEALTH MAINTENANCE: Social History   Tobacco Use   Smoking status: Never   Smokeless tobacco: Never  Vaping Use   Vaping status: Never Used  Substance Use Topics   Alcohol use: No    Alcohol/week: 0.0 standard drinks of alcohol   Drug use: No     Colonoscopy:  PAP:  Bone density:  Lipid panel:  Allergies  Allergen Reactions   Demeclocycline Other (See Comments)    Throat swells   Sulfa Antibiotics Other (See Comments)    Other reaction(s): Other (See Comments) Throat swells Other reaction(s): Other (See Comments) Throat swells Throat swells Other reaction(s): Other (See Comments) Throat swells Other reaction(s): Unknown Other reaction(s): Other (See Comments) Throat swells Other reaction(s): Other (See Comments) Other reaction(s): Other (See Comments) Throat swellsOther reaction(s): Other (See Comments) Throat  swells   Tetracyclines & Related Other (See Comments)    Throat swells   Chlordiazepoxide    Dicyclomine    Levofloxacin Diarrhea    Severe diarrhea   Augmentin [Amoxicillin-Pot Clavulanate] Diarrhea   Bentyl [Dicyclomine Hcl]     unkn   Ciprofloxacin Diarrhea   Codeine Other (See Comments)    dizziness     Dicyclomine Hcl Other (See Comments)    unkn   Epinephrine     Speeds heart up - panic    Flagyl [Metronidazole] Nausea And Vomiting   Librax [Chlordiazepoxide-Clidinium]     unkn   Novocain [Procaine] Other (See Comments)    "Shaky"   Phenobarbital     unkn   Prednisone     Nervous     Tramadol Other (See Comments)    Sick feeling    Current Outpatient Medications  Medication Sig Dispense Refill   amitriptyline (ELAVIL) 10 MG tablet Take 10 mg by  mouth in the morning and at bedtime.     Azelastine-Fluticasone 137-50 MCG/ACT SUSP SPRAY 2 SPRAYS INTO EACH NOSTRIL EVERY DAY 23 g 2   carbidopa-levodopa (SINEMET IR) 25-100 MG tablet Take 1 tablet by mouth 3 (three) times daily.     clonazePAM (KLONOPIN) 0.5 MG tablet Take 1 tablet (0.5 mg total) by mouth 3 (three) times daily as needed for anxiety. 30 tablet 0   escitalopram (LEXAPRO) 20 MG tablet Take 20 mg by mouth every morning.      levothyroxine (SYNTHROID) 88 MCG tablet Take 1 tablet (88 mcg total) by mouth daily. 90 tablet 1   olutasidenib (REZLIDHIA) 150 MG capsule Take 1 capsule (150 mg total) by mouth daily. Take on an empty stomach at least 1 hour before or 2 hours after a meal. 30 capsule 2   pantoprazole (PROTONIX) 40 MG tablet Take 1 tablet (40 mg total) by mouth 2 (two) times daily before a meal. 180 tablet 1   potassium chloride (KLOR-CON M) 10 MEQ tablet TAKE ONE TABLET BY MOUTH TWICE DAILY 60 tablet 2   prochlorperazine (COMPAZINE) 10 MG tablet Take 1 tablet (10 mg total) by mouth every 6 (six) hours as needed for nausea or vomiting. 30 tablet 3   valACYclovir (VALTREX) 500 MG tablet TAKE ONE TABLET BY  MOUTH DAILY 30 tablet 2   No current facility-administered medications for this visit.    OBJECTIVE: Vitals:   05/31/23 1405  BP: 114/74  Pulse: 95  Resp: 18  Temp: (!) 96.5 F (35.8 C)  SpO2: 100%     Body mass index is 21.22 kg/m.    ECOG FS:0 - Asymptomatic  General: Well-developed, well-nourished, no acute distress. Eyes: Pink conjunctiva, anicteric sclera. HEENT: Normocephalic, moist mucous membranes. Lungs: No audible wheezing or coughing. Heart: Regular rate and rhythm. Abdomen: Soft, nontender, no obvious distention. Musculoskeletal: No edema, cyanosis, or clubbing. Neuro: Alert, answering all questions appropriately. Cranial nerves grossly intact. Skin: No rashes or petechiae noted. Psych: Normal affect.  LAB RESULTS:  Lab Results  Component Value Date   NA 139 05/31/2023   K 4.0 05/31/2023   CL 105 05/31/2023   CO2 27 05/31/2023   GLUCOSE 142 (H) 05/31/2023   BUN 17 05/31/2023   CREATININE 0.93 05/31/2023   CALCIUM 8.9 05/31/2023   PROT 6.6 05/31/2023   ALBUMIN 4.3 05/31/2023   AST 18 05/31/2023   ALT 15 05/31/2023   ALKPHOS 54 05/31/2023   BILITOT 0.8 05/31/2023   GFRNONAA >60 05/31/2023   GFRAA 54 (L) 10/26/2019    Lab Results  Component Value Date   WBC 4.1 05/31/2023   NEUTROABS 3.0 05/31/2023   HGB 13.1 05/31/2023   HCT 37.2 05/31/2023   MCV 102.2 (H) 05/31/2023   PLT 131 (L) 05/31/2023     STUDIES: No results found.   ASSESSMENT: AML.  PLAN:    AML: Confirmed by bone marrow biopsy.  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  Patient's most recent bone marrow biopsy on March 23, 2022 revealed an increase of her blast count from 4% previously to up to 15% currently.  She last received treatment with cycle 22 of Vidaza on February 13, 2022.  Given her progressive disease and  IDH-1 mutation, Vidaza was discontinued  and patient initially received 150 mg twice daily of Olutasidenib (Rezlidhia).  This has been now dose reduce to once per day  secondary to nausea and thrombocytopenia.  Patient will require repeat bone marrow biopsy in the near future, but given her psychiatric and anxiety issues, this may be delayed until her medication regimen is optimized by her psychiatrist.  No intervention is needed at this time.  Return to clinic in 4 weeks for laboratory work and evaluation by clinical pharmacy.  Patient will then return to clinic in 8 weeks for laboratory work, further evaluation, and continuation of treatment. Thrombocytopenia: Chronic and unchanged.  Patient's platelet count is 131 today.  Continue dose reduced treatment once per day as above. Diarrhea: Resolved.  Continue Lomotil as needed.   Nausea: Improved with dose reduction.  Continue ondansetron and Compazine as needed.   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  Patient also received chemotherapy and likely received Adriamycin, but exact regimen is unknown.  She completed 5 years of hormonal therapy in approximately June 2015. Currently, she has no evidence of disease.  Her most recent mammogram on July 29, 2021 was reported as BI-RADS 2.   History of colon cancer: Patient also states that she received chemotherapy for this, possibly FOLFOX but again this is unknown. Hair thinning: Improving.  Likely related to thyroid dosing. Psychiatric/anxiety/tremor: Patient now on amitriptyline and carbidopa/levodopa.  Continue follow-up and treatment per primary psychiatrist. Laryngitis: Patient reports she missed her ENT appointment and a referral was sent back.   Patient expressed understanding and was in agreement with this plan. She also understands that She can call clinic at any  time with any questions, concerns, or complaints.    Cancer Staging  History of breast cancer Staging form: Breast, AJCC 7th Edition - Clinical stage from 01/07/2016: Stage IA (T1c, N0, M0) - Signed by Jeralyn Ruths, MD on 01/07/2016 Laterality: Right Estrogen receptor status: Positive Progesterone receptor status: Positive HER2 status: Negative   Jeralyn Ruths, MD   05/31/2023 2:49 PM

## 2023-06-07 ENCOUNTER — Ambulatory Visit: Payer: PPO | Admitting: Internal Medicine

## 2023-06-07 DIAGNOSIS — R200 Anesthesia of skin: Secondary | ICD-10-CM | POA: Diagnosis not present

## 2023-06-09 ENCOUNTER — Other Ambulatory Visit: Payer: Self-pay

## 2023-06-09 ENCOUNTER — Telehealth: Payer: Self-pay

## 2023-06-09 ENCOUNTER — Other Ambulatory Visit (HOSPITAL_COMMUNITY): Payer: Self-pay

## 2023-06-09 NOTE — Progress Notes (Signed)
 Specialty Pharmacy Refill Coordination Note  Debra Hale is a 73 y.o. female contacted today regarding refills of specialty medication(s) Olutasidenib  (REZLIDHIA )   Patient requested Delivery   Delivery date: 06/11/23   Verified address: [address]   Medication will be filled on 06/10/23.

## 2023-06-09 NOTE — Telephone Encounter (Signed)
 Oral Oncology Patient Advocate Encounter   Was successful in securing patient a $4,000.00 grant from [employer] (PA[employer]) to provide copayment coverage for Rezlidhia .  This will keep the out of pocket expense at $0.     The billing information is as follows and has been shared with [facility].   RxBin: [id number] PCN:  [id number] Member ID: [id number] Group ID: [id number] Dates of Eligibility: 05/16/23 through 06/08/24   Morene Potters, CPhT Oncology Pharmacy Patient Advocate  [facility]  [phone] (phone) [phone] (fax) 06/09/2023 2:08 PM

## 2023-06-10 ENCOUNTER — Other Ambulatory Visit (HOSPITAL_COMMUNITY): Payer: Self-pay

## 2023-06-10 ENCOUNTER — Other Ambulatory Visit: Payer: Self-pay

## 2023-06-16 DIAGNOSIS — R200 Anesthesia of skin: Secondary | ICD-10-CM | POA: Diagnosis not present

## 2023-06-16 DIAGNOSIS — R531 Weakness: Secondary | ICD-10-CM | POA: Diagnosis not present

## 2023-06-16 DIAGNOSIS — R2689 Other abnormalities of gait and mobility: Secondary | ICD-10-CM | POA: Diagnosis not present

## 2023-06-16 DIAGNOSIS — R498 Other voice and resonance disorders: Secondary | ICD-10-CM | POA: Diagnosis not present

## 2023-06-16 DIAGNOSIS — R258 Other abnormal involuntary movements: Secondary | ICD-10-CM | POA: Diagnosis not present

## 2023-06-16 DIAGNOSIS — G5603 Carpal tunnel syndrome, bilateral upper limbs: Secondary | ICD-10-CM | POA: Diagnosis not present

## 2023-06-16 DIAGNOSIS — G20A1 Parkinson's disease without dyskinesia, without mention of fluctuations: Secondary | ICD-10-CM | POA: Diagnosis not present

## 2023-06-28 ENCOUNTER — Inpatient Hospital Stay: Payer: PPO | Attending: Oncology

## 2023-06-28 ENCOUNTER — Inpatient Hospital Stay: Payer: PPO | Admitting: Pharmacist

## 2023-06-28 DIAGNOSIS — C9200 Acute myeloblastic leukemia, not having achieved remission: Secondary | ICD-10-CM

## 2023-06-28 LAB — COMPREHENSIVE METABOLIC PANEL
ALT: 11 U/L (ref 0–44)
AST: 20 U/L (ref 15–41)
Albumin: 4 g/dL (ref 3.5–5.0)
Alkaline Phosphatase: 55 U/L (ref 38–126)
Anion gap: 12 (ref 5–15)
BUN: 15 mg/dL (ref 8–23)
CO2: 26 mmol/L (ref 22–32)
Calcium: 9.2 mg/dL (ref 8.9–10.3)
Chloride: 102 mmol/L (ref 98–111)
Creatinine, Ser: 1.06 mg/dL — ABNORMAL HIGH (ref 0.44–1.00)
GFR, Estimated: 55 mL/min — ABNORMAL LOW (ref >60)
Glucose, Bld: 134 mg/dL — ABNORMAL HIGH (ref 70–99)
Potassium: 3.7 mmol/L (ref 3.5–5.1)
Sodium: 140 mmol/L (ref 135–145)
Total Bilirubin: 1 mg/dL (ref 0.0–1.2)
Total Protein: 6.7 g/dL (ref 6.5–8.1)

## 2023-06-28 LAB — CBC WITH DIFFERENTIAL/PLATELET
Abs Immature Granulocytes: 0.01 10*3/uL (ref 0.00–0.07)
Basophils Absolute: 0 10*3/uL (ref 0.0–0.1)
Basophils Relative: 0 %
Eosinophils Absolute: 0.1 10*3/uL (ref 0.0–0.5)
Eosinophils Relative: 2 %
HCT: 32 % — ABNORMAL LOW (ref 36.0–46.0)
Hemoglobin: 11.6 g/dL — ABNORMAL LOW (ref 12.0–15.0)
Immature Granulocytes: 0 %
Lymphocytes Relative: 11 %
Lymphs Abs: 0.5 10*3/uL — ABNORMAL LOW (ref 0.7–4.0)
MCH: 37.2 pg — ABNORMAL HIGH (ref 26.0–34.0)
MCHC: 36.3 g/dL — ABNORMAL HIGH (ref 30.0–36.0)
MCV: 102.6 fL — ABNORMAL HIGH (ref 80.0–100.0)
Monocytes Absolute: 0.3 10*3/uL (ref 0.1–1.0)
Monocytes Relative: 6 %
Neutro Abs: 3.7 10*3/uL (ref 1.7–7.7)
Neutrophils Relative %: 81 %
Platelets: 112 10*3/uL — ABNORMAL LOW (ref 150–400)
RBC: 3.12 MIL/uL — ABNORMAL LOW (ref 3.87–5.11)
RDW: 14.5 % (ref 11.5–15.5)
WBC: 4.6 10*3/uL (ref 4.0–10.5)
nRBC: 0 % (ref 0.0–0.2)

## 2023-06-28 MED ORDER — FAMOTIDINE 20 MG PO TABS: 20.0000 mg | ORAL_TABLET | Freq: Every day | ORAL | 0 refills | Status: DC | PRN

## 2023-06-28 NOTE — Progress Notes (Signed)
 Oral Chemotherapy Clinic [facility]  Telephone:([phone] Fax:([phone]  Patient Care Team: Dale [place], MD as PCP - General (Internal Medicine) Iran Ouch, MD as PCP - Cardiology (Cardiology) Jeralyn Ruths, MD as Consulting Physician (Oncology)   Name of the patient: Debra Hale  [id number]  1951-03-08   Encounter Date 06/28/2023  HPI: Patient is a 73 y.o. female with AML. Previously treated with azacitadine and venetoclax. A repeat BM biopsy showed disease progression. 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 She was dose reduced to olutasidenib 100mg  daily on 06/22/22 due to decreased pltc.   Reason for Consult: Oral chemotherapy follow-up for Rezlidhia (olutasidenib) therapy.   PAST MEDICAL HISTORY: Past Medical History:  Diagnosis Date   Abdominal pain 03/06/2016   Acute pericarditis 05/31/2013   Anxiety    Arthritis    Osteoarthritis   Breast cancer (HCC) 2009   right breast lumpectomy with rad tx   Colon cancer (HCC)    surgery with chemo and rad tx   Complication of anesthesia    GERD (gastroesophageal reflux disease)    Heart palpitations    History of hiatal hernia    History of kidney stones    History of thyroid cancer 09/18/2008   Qualifier: Diagnosis of   By: Denyse Amass CMA, Carol       Hypothyroidism    Liver disease    Liver nodule    s/p negative biopsy   Malignant neoplasm of thyroid gland (HCC) 2002   s/p surgery and XRT   Osteoporosis    Other and unspecified hyperlipidemia    Palpitations    Personal history of chemotherapy    Personal history of malignant neoplasm of large intestine    carcinoma - cecum, s/p right laparoscopic colectomy - s/p chemotherapy and XRT   Personal history of radiation therapy    Pneumonia 2019   PONV (postoperative nausea and vomiting)    Pure hypercholesterolemia    Unspecified hereditary and idiopathic peripheral  neuropathy     HEMATOLOGY/ONCOLOGY HISTORY:  Oncology History  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 Initial Diagnosis   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HCC)   06/10/2020 - 12/19/2021 Chemotherapy   Patient is on Treatment Plan : AML dose reduced azacitidine SQ D1-5 q28d     06/10/2020 - 02/13/2022 Chemotherapy   Patient is on Treatment Plan : AML Azacitidine SQ D1-5 q28d       ALLERGIES:  is allergic to demeclocycline, sulfa antibiotics, tetracyclines & related, chlordiazepoxide, dicyclomine, levofloxacin, augmentin [amoxicillin-pot clavulanate], bentyl [dicyclomine hcl], ciprofloxacin, codeine, dicyclomine hcl, epinephrine, flagyl [metronidazole], librax [chlordiazepoxide-clidinium], novocain [procaine], phenobarbital, prednisone, and tramadol.  MEDICATIONS:  Current Outpatient Medications  Medication Sig Dispense Refill   amitriptyline (ELAVIL) 10 MG tablet Take 10 mg by mouth in the morning and at bedtime.     Azelastine-Fluticasone 137-50 MCG/ACT SUSP SPRAY 2 SPRAYS INTO EACH NOSTRIL EVERY DAY 23 g 2   carbidopa-levodopa (SINEMET IR) 25-100 MG tablet Take 1 tablet by mouth 3 (three) times daily.     clonazePAM (KLONOPIN) 0.5 MG tablet Take 1 tablet (0.5 mg total) by mouth 3 (three) times daily as needed for anxiety. 30 tablet 0   escitalopram (LEXAPRO) 20 MG tablet Take 20 mg by mouth every morning.      levothyroxine (SYNTHROID) 88 MCG tablet Take 1 tablet (88 mcg total) by mouth daily. 90 tablet 1   olutasidenib (REZLIDHIA) 150 MG capsule Take 1 capsule (150  mg total) by mouth daily. Take on an empty stomach at least 1 hour before or 2 hours after a meal. 30 capsule 2   pantoprazole (PROTONIX) 40 MG tablet Take 1 tablet (40 mg total) by mouth 2 (two) times daily before a meal. 180 tablet 1   potassium chloride (KLOR-CON M) 10 MEQ tablet TAKE ONE TABLET BY MOUTH TWICE DAILY 60 tablet 2   prochlorperazine (COMPAZINE) 10 MG tablet Take 1 tablet (10 mg total) by mouth  every 6 (six) hours as needed for nausea or vomiting. 30 tablet 3   valACYclovir (VALTREX) 500 MG tablet TAKE ONE TABLET BY MOUTH DAILY 30 tablet 2   No current facility-administered medications for this visit.    VITAL SIGNS: There were no vitals taken for this visit. There were no vitals filed for this visit.     Estimated body mass index is 21.22 kg/m as calculated from the following:   Height as of 05/31/23: 5\' 4"  (1.626 m).   Weight as of 05/31/23: 56.1 kg (123 lb 9.6 oz).  LABS: CBC:    Component Value Date/Time   WBC 4.6 06/28/2023 1357   HGB 11.6 (L) 06/28/2023 1357   HGB 12.5 05/09/2018 1147   HCT 32.0 (L) 06/28/2023 1357   HCT 36.8 05/09/2018 1147   PLT 112 (L) 06/28/2023 1357   PLT 285 05/09/2018 1147   MCV 102.6 (H) 06/28/2023 1357   MCV 86 05/09/2018 1147   MCV 89 11/30/2013 1419   NEUTROABS 3.7 06/28/2023 1357   NEUTROABS 2.9 11/20/2013 1404   LYMPHSABS 0.5 (L) 06/28/2023 1357   LYMPHSABS 0.9 (L) 11/20/2013 1404   MONOABS 0.3 06/28/2023 1357   MONOABS 0.2 11/20/2013 1404   EOSABS 0.1 06/28/2023 1357   EOSABS 0.1 11/20/2013 1404   BASOSABS 0.0 06/28/2023 1357   BASOSABS 0.0 11/20/2013 1404   Comprehensive Metabolic Panel:    Component Value Date/Time   NA 140 06/28/2023 1357   NA 143 05/09/2018 1147   NA 145 11/30/2013 1419   K 3.7 06/28/2023 1357   K 3.5 11/30/2013 1419   CL 102 06/28/2023 1357   CL 109 (H) 11/30/2013 1419   CO2 26 06/28/2023 1357   CO2 29 11/30/2013 1419   BUN 15 06/28/2023 1357   BUN 11 05/09/2018 1147   BUN 15 11/30/2013 1419   CREATININE 1.06 (H) 06/28/2023 1357   CREATININE 1.04 11/30/2013 1419   GLUCOSE 134 (H) 06/28/2023 1357   GLUCOSE 97 11/30/2013 1419   CALCIUM 9.2 06/28/2023 1357   CALCIUM 8.7 11/30/2013 1419   AST 20 06/28/2023 1357   AST 28 11/30/2013 1419   ALT 11 06/28/2023 1357   ALT 71 (H) 11/30/2013 1419   ALKPHOS 55 06/28/2023 1357   ALKPHOS 114 11/30/2013 1419   BILITOT 1.0 06/28/2023 1357   BILITOT  0.7 11/30/2013 1419   PROT 6.7 06/28/2023 1357   PROT 7.1 11/30/2013 1419   ALBUMIN 4.0 06/28/2023 1357   ALBUMIN 3.3 (L) 11/30/2013 1419     Present during today's visit: patient only  Assessment and Plan: CMP/CBC reviewed, continue reduced dose olutasidenib 150mg  daily. Patient is due for bone marrow biopsy but feels she is unable to obtain one at this time due to her other medical conditions.   Patient reports that tremors have had some improvement with medication changes from neurology but that her symptoms still remain significant as she has challenges with mobility.  She is currently being seen by neurology for Parkinson's workup. She was  started on Sinemet since last pharmacy visit. Encouraged to follow up with neurology for further disease management.  Of note, she has not been able to get in with ENT for her laryngitis which she states she also wants to have done prior to obtaining a bone marrow biopsy.  She reports having reflux symptoms. Fill history shows she takes pantoprazole for this. Prescription sent for famotidine 20 mg daily PRN to patients pharmacy.  Patient reports experiencing significant challenges with her current living situation and  expresses concern about her ability to manage these stressors independently due to her physical limitations. Social determinants of health are impacting the patient's ability to fully engage in and benefit from treatment.  Referral made to social work for assessment and support regarding housing instability and related challenges.     Oral Chemotherapy Adherence: No missed doses reported There are no patient barriers to medication adherence identified.   New medications: None reported   Medication Access Issues: No issues, patient fills at [facility].   Patient expressed understanding and was in agreement with this plan. She also understands that She can call clinic at any time with any questions, concerns, or complaints.    Follow-up plan: RTC in 4 weeks lab/MD.   Thank you for allowing me to participate in the care of this very pleasant patient.   Time Total: 20 minutes  Visit consisted of counseling and education on dealing with issues of symptom management in the setting of serious and potentially life-threatening illness.Greater than 50% of this time was spent counseling and coordinating care related to the above assessment and plan.  Signed by: Jerry Caras, PharmD PGY2 Oncology Pharmacy Resident   06/28/2023 3:54 PM

## 2023-06-30 ENCOUNTER — Inpatient Hospital Stay: Payer: PPO | Admitting: Licensed Clinical Social Worker

## 2023-06-30 DIAGNOSIS — R200 Anesthesia of skin: Secondary | ICD-10-CM | POA: Diagnosis not present

## 2023-06-30 NOTE — Progress Notes (Signed)
 [facility] CSW Progress Note  Clinical Child psychotherapist contacted patient by phone to assess needs and provide support.  CSW received referral from [employer] regarding housing needs.  Patient denied any assistance at this time.  She had worked with CSW, Joseph Art, in the past.  She agreed to have CSW mail her contact and program information.    Dorothey Baseman, LCSW Clinical Social Worker [facility]

## 2023-07-06 ENCOUNTER — Other Ambulatory Visit: Payer: Self-pay | Admitting: *Deleted

## 2023-07-06 ENCOUNTER — Ambulatory Visit: Admitting: Family Medicine

## 2023-07-06 ENCOUNTER — Other Ambulatory Visit: Payer: Self-pay

## 2023-07-06 ENCOUNTER — Ambulatory Visit: Payer: Self-pay | Admitting: Internal Medicine

## 2023-07-06 ENCOUNTER — Other Ambulatory Visit: Payer: Self-pay | Admitting: Pharmacist

## 2023-07-06 ENCOUNTER — Inpatient Hospital Stay: Attending: Oncology

## 2023-07-06 DIAGNOSIS — Z85038 Personal history of other malignant neoplasm of large intestine: Secondary | ICD-10-CM | POA: Insufficient documentation

## 2023-07-06 DIAGNOSIS — R92323 Mammographic fibroglandular density, bilateral breasts: Secondary | ICD-10-CM | POA: Insufficient documentation

## 2023-07-06 DIAGNOSIS — Z79899 Other long term (current) drug therapy: Secondary | ICD-10-CM | POA: Insufficient documentation

## 2023-07-06 DIAGNOSIS — Z853 Personal history of malignant neoplasm of breast: Secondary | ICD-10-CM | POA: Diagnosis not present

## 2023-07-06 DIAGNOSIS — Z9221 Personal history of antineoplastic chemotherapy: Secondary | ICD-10-CM | POA: Insufficient documentation

## 2023-07-06 DIAGNOSIS — D6959 Other secondary thrombocytopenia: Secondary | ICD-10-CM | POA: Diagnosis not present

## 2023-07-06 DIAGNOSIS — C9200 Acute myeloblastic leukemia, not having achieved remission: Secondary | ICD-10-CM | POA: Diagnosis not present

## 2023-07-06 DIAGNOSIS — G20A1 Parkinson's disease without dyskinesia, without mention of fluctuations: Secondary | ICD-10-CM | POA: Diagnosis not present

## 2023-07-06 LAB — URINALYSIS, COMPLETE (UACMP) WITH MICROSCOPIC
Bilirubin Urine: NEGATIVE
Glucose, UA: NEGATIVE mg/dL
Ketones, ur: 5 mg/dL — AB
Nitrite: POSITIVE — AB
Protein, ur: 100 mg/dL — AB
RBC / HPF: >50 RBC/hpf (ref 0–5)
Specific Gravity, Urine: 1.024 (ref 1.005–1.030)
WBC, UA: >50 WBC/hpf (ref 0–5)
pH: 5 (ref 5.0–8.0)

## 2023-07-06 MED ORDER — NITROFURANTOIN MONOHYD MACRO 100 MG PO CAPS: 100.0000 mg | ORAL_CAPSULE | Freq: Two times a day (BID) | ORAL | 0 refills | Status: DC

## 2023-07-06 NOTE — Telephone Encounter (Signed)
 Chief Complaint: Pain/burning with urination Symptoms: Pain/burning with urination Frequency: worsened over last few days Pertinent Negatives: Patient denies fever, abdominal pain, back pain, blood in urine Disposition: [] ED /[] Urgent Care (no appt availability in office) / [x] Appointment(In office/virtual)/ []  [place] [facility]/ [] Home Care/ [] Refused Recommended Disposition /[]  Mobile Bus/ []  Follow-up with PCP Additional Notes: patient called in stating she has a history of UTI's and has been experiencing increased burning and discomfort, worsening this morning to the point she could not make it to the restroom before urinating. Patient denies any changes in appearance or odor of urine, fever, abdominal pain, back pain. Patient appt made for earliest availability. Patient given office address of appt for today, phone number, and providers name.    Copied from CRM [id number]. Topic: Clinical - Red Word Triage >> Jul 06, 2023 10:10 AM Adele Barthel wrote: [facility] that prompted transfer to Nurse Triage:   Burning sensation when urinating Pain became worse last night No abdominal pain or fever History of UTIs Reason for Disposition  [1] Can't control passage of urine (i.e., urinary incontinence) AND [2] new-onset (< 2 weeks) or worsening  Answer Assessment - Initial Assessment Questions 1. SYMPTOM: "What's the main symptom you're concerned about?" (e.g., frequency, incontinence)     Pain/discomfort with urination 2. ONSET: "When did the  discomfort  start?"     Last night 3. PAIN: "Is there any pain?" If Yes, ask: "How bad is it?" (Scale: 1-10; mild, moderate, severe)     Severe 4. CAUSE: "What do you think is causing the symptoms?"     Patient has history of UTI's 5. OTHER SYMPTOMS: "Do you have any other symptoms?" (e.g., blood in urine, fever, flank pain, pain with urination)     Pain/burning with urination  Protocols used: Urinary Symptoms-A-AH

## 2023-07-06 NOTE — Telephone Encounter (Signed)
 Called patient to offer appointment. She had urine done by the cancer center and they are treating

## 2023-07-06 NOTE — Telephone Encounter (Signed)
 Called Patient and offered an appointment with Dr. Heide Spark for tomorrow. Patient states she does not know if she can come in and she is on another call she will call back. I attempted to let her know what was available for tomorrow but she insisted she would call back.

## 2023-07-07 ENCOUNTER — Other Ambulatory Visit: Payer: Self-pay | Admitting: Hospice and Palliative Medicine

## 2023-07-07 DIAGNOSIS — C9200 Acute myeloblastic leukemia, not having achieved remission: Secondary | ICD-10-CM

## 2023-07-08 LAB — URINE CULTURE: Culture: >=100000 — AB

## 2023-07-09 ENCOUNTER — Other Ambulatory Visit: Payer: Self-pay | Admitting: Pharmacist

## 2023-07-09 ENCOUNTER — Other Ambulatory Visit: Payer: Self-pay

## 2023-07-09 ENCOUNTER — Other Ambulatory Visit: Payer: Self-pay | Admitting: Pharmacy Technician

## 2023-07-09 DIAGNOSIS — C9200 Acute myeloblastic leukemia, not having achieved remission: Secondary | ICD-10-CM

## 2023-07-09 MED ORDER — OLUTASIDENIB 150 MG PO CAPS
150.0000 mg | ORAL_CAPSULE | Freq: Every day | ORAL | 2 refills | Status: DC
  Filled 2023-07-09: qty 30, 30d supply, fill #0
  Filled 2023-08-04 (×2): qty 30, 30d supply, fill #1
  Filled 2023-09-02: qty 30, 30d supply, fill #2

## 2023-07-09 NOTE — Progress Notes (Signed)
 Specialty Pharmacy Refill Coordination Note  Debra Hale is a 73 y.o. female contacted today regarding refills of specialty medication(s) Olutasidenib Tamala Fothergill)   Patient requested Delivery   Delivery date: 07/13/23   Verified address: [address] [place]   Medication will be filled on 07/12/23.  RR sent to MD

## 2023-07-21 DIAGNOSIS — R490 Dysphonia: Secondary | ICD-10-CM | POA: Diagnosis not present

## 2023-07-21 DIAGNOSIS — J300 Vasomotor rhinitis: Secondary | ICD-10-CM | POA: Diagnosis not present

## 2023-07-22 ENCOUNTER — Telehealth: Payer: Self-pay | Admitting: *Deleted

## 2023-07-22 ENCOUNTER — Other Ambulatory Visit: Payer: Self-pay | Admitting: Psychiatry

## 2023-07-22 NOTE — Telephone Encounter (Signed)
 He will fax  a lab that Dr, Mare Ferrari would like the pt. To get a lab about level of nortriptyline , when she gets lab for [facility].I also gave him a paper asking for this. While we are waiting for the fax from Dr. Lenis Noon

## 2023-07-26 DIAGNOSIS — G20A1 Parkinson's disease without dyskinesia, without mention of fluctuations: Secondary | ICD-10-CM | POA: Diagnosis not present

## 2023-07-26 DIAGNOSIS — R531 Weakness: Secondary | ICD-10-CM | POA: Diagnosis not present

## 2023-07-26 DIAGNOSIS — R498 Other voice and resonance disorders: Secondary | ICD-10-CM | POA: Diagnosis not present

## 2023-07-26 DIAGNOSIS — G5603 Carpal tunnel syndrome, bilateral upper limbs: Secondary | ICD-10-CM | POA: Diagnosis not present

## 2023-07-26 DIAGNOSIS — R2689 Other abnormalities of gait and mobility: Secondary | ICD-10-CM | POA: Diagnosis not present

## 2023-07-26 DIAGNOSIS — R258 Other abnormal involuntary movements: Secondary | ICD-10-CM | POA: Diagnosis not present

## 2023-07-27 ENCOUNTER — Encounter: Payer: Self-pay | Admitting: Oncology

## 2023-07-27 ENCOUNTER — Inpatient Hospital Stay (HOSPITAL_BASED_OUTPATIENT_CLINIC_OR_DEPARTMENT_OTHER): Payer: PPO | Admitting: Oncology

## 2023-07-27 ENCOUNTER — Inpatient Hospital Stay: Payer: PPO

## 2023-07-27 VITALS — BP 111/65 | HR 89 | Temp 96.0°F | Resp 16 | Ht 64.0 in | Wt 123.6 lb

## 2023-07-27 DIAGNOSIS — C9200 Acute myeloblastic leukemia, not having achieved remission: Secondary | ICD-10-CM

## 2023-07-27 LAB — COMPREHENSIVE METABOLIC PANEL
ALT: 21 U/L (ref 0–44)
AST: 12 U/L — ABNORMAL LOW (ref 15–41)
Albumin: 3.9 g/dL (ref 3.5–5.0)
Alkaline Phosphatase: 58 U/L (ref 38–126)
Anion gap: 11 (ref 5–15)
BUN: 17 mg/dL (ref 8–23)
CO2: 26 mmol/L (ref 22–32)
Calcium: 8.8 mg/dL — ABNORMAL LOW (ref 8.9–10.3)
Chloride: 103 mmol/L (ref 98–111)
Creatinine, Ser: 0.94 mg/dL (ref 0.44–1.00)
GFR, Estimated: >60 mL/min (ref >60)
Glucose, Bld: 79 mg/dL (ref 70–99)
Potassium: 3.6 mmol/L (ref 3.5–5.1)
Sodium: 140 mmol/L (ref 135–145)
Total Bilirubin: 0.9 mg/dL (ref 0.0–1.2)
Total Protein: 6.4 g/dL — ABNORMAL LOW (ref 6.5–8.1)

## 2023-07-27 LAB — CBC WITH DIFFERENTIAL/PLATELET
Abs Immature Granulocytes: 0.3 10*3/uL — ABNORMAL HIGH (ref 0.00–0.07)
Basophils Absolute: 0 10*3/uL (ref 0.0–0.1)
Basophils Relative: 0 %
Eosinophils Absolute: 0.1 10*3/uL (ref 0.0–0.5)
Eosinophils Relative: 3 %
HCT: 31.8 % — ABNORMAL LOW (ref 36.0–46.0)
Hemoglobin: 11.3 g/dL — ABNORMAL LOW (ref 12.0–15.0)
Immature Granulocytes: 8 %
Lymphocytes Relative: 18 %
Lymphs Abs: 0.7 10*3/uL (ref 0.7–4.0)
MCH: 37 pg — ABNORMAL HIGH (ref 26.0–34.0)
MCHC: 35.5 g/dL (ref 30.0–36.0)
MCV: 104.3 fL — ABNORMAL HIGH (ref 80.0–100.0)
Monocytes Absolute: 0.3 10*3/uL (ref 0.1–1.0)
Monocytes Relative: 7 %
Neutro Abs: 2.6 10*3/uL (ref 1.7–7.7)
Neutrophils Relative %: 64 %
Platelets: 128 10*3/uL — ABNORMAL LOW (ref 150–400)
RBC: 3.05 MIL/uL — ABNORMAL LOW (ref 3.87–5.11)
RDW: 15.4 % (ref 11.5–15.5)
Smear Review: NORMAL
WBC: 3.9 10*3/uL — ABNORMAL LOW (ref 4.0–10.5)
nRBC: 0 % (ref 0.0–0.2)

## 2023-07-27 NOTE — Progress Notes (Signed)
 [facility]  [phone]) [phone] [phone]) [phone]  ID: NALLA PURDY OB: [birth year]  MR#: [id number]  UXL#:[id number]  Patient Care Team: Dale , MD as PCP - General (Internal Medicine) Iran Ouch, MD as PCP - Cardiology (Cardiology) Jeralyn Ruths, MD as Consulting Physician (Oncology)  CHIEF COMPLAINT: AML.  INTERVAL HISTORY: Patient returns to clinic today for repeat lab work, further evaluation and continuation of treatment.  She reports she was recently diagnosed with Parkinson's and started new medications, but is unclear what.  She also reports increased nausea.  She continues to have increased anxiety. She continues to take and tolerate dose reduced Rezlidhia. She denies any pain.  She has no new neurologic complaints.  She denies any recent fevers or illnesses.  She has a fair appetite, but denies weight loss.  She has no chest pain, shortness of breath, cough, or hemoptysis.  She denies any nausea, vomiting, constipation, or diarrhea.  Patient offers no further specific complaints today.  REVIEW OF SYSTEMS:   Review of Systems  Constitutional: Negative.  Negative for fever, malaise/fatigue and weight loss.  HENT:  Negative for congestion and sinus pain.   Respiratory: Negative.  Negative for cough, hemoptysis and shortness of breath.   Cardiovascular: Negative.  Negative for chest pain and leg swelling.  Gastrointestinal:  Positive for nausea. Negative for abdominal pain and diarrhea.  Genitourinary:  Negative for dysuria, frequency and urgency.  Musculoskeletal: Negative.  Negative for back pain and falls.  Skin: Negative.  Negative for rash.  Neurological:  Positive for tremors. Negative for dizziness, speech change, focal weakness, weakness and headaches.  Psychiatric/Behavioral:  Negative for memory loss. The patient is nervous/anxious.     As per HPI. Otherwise, a complete review of systems is negative.  PAST MEDICAL  HISTORY: Past Medical History:  Diagnosis Date   Abdominal pain 03/06/2016   Acute pericarditis 05/31/2013   Anxiety    Arthritis    Osteoarthritis   Breast cancer (HCC) 2009   right breast lumpectomy with rad tx   Colon cancer (HCC)    surgery with chemo and rad tx   Complication of anesthesia    GERD (gastroesophageal reflux disease)    Heart palpitations    History of hiatal hernia    History of kidney stones    History of thyroid cancer 09/18/2008   Qualifier: Diagnosis of   By: Denyse Amass, CMA, Carol       Hypothyroidism    Liver disease    Liver nodule    s/p negative biopsy   Malignant neoplasm of thyroid gland (HCC) 2002   s/p surgery and XRT   Osteoporosis    Other and unspecified hyperlipidemia    Palpitations    Personal history of chemotherapy    Personal history of malignant neoplasm of large intestine    carcinoma - cecum, s/p right laparoscopic colectomy - s/p chemotherapy and XRT   Personal history of radiation therapy    Pneumonia 2019   PONV (postoperative nausea and vomiting)    Pure hypercholesterolemia    Unspecified hereditary and idiopathic peripheral neuropathy     PAST SURGICAL HISTORY: Past Surgical History:  Procedure Laterality Date   APPENDECTOMY  1985   BREAST BIOPSY Right 2009   positive   BREAST BIOPSY Right 2009   negative   BREAST LUMPECTOMY Right 2009   breast cancer   CHOLECYSTECTOMY  1995   COLONOSCOPY     COLONOSCOPY WITH PROPOFOL N/A 10/29/2017   Procedure: COLONOSCOPY  WITH PROPOFOL;  Surgeon: Scot Jun, MD;  Location: [facility] ENDOSCOPY;  Service: Endoscopy;  Laterality: N/A;   DILATION AND CURETTAGE OF UTERUS  1990   DILATION AND CURETTAGE, DIAGNOSTIC / THERAPEUTIC  1990   ESOPHAGOGASTRODUODENOSCOPY     ESOPHAGOGASTRODUODENOSCOPY (EGD) WITH PROPOFOL N/A 10/29/2017   Procedure: ESOPHAGOGASTRODUODENOSCOPY (EGD) WITH PROPOFOL;  Surgeon: Scot Jun, MD;  Location: [facility] ENDOSCOPY;  Service: Endoscopy;  Laterality: N/A;    INCISION AND DRAINAGE PERIRECTAL ABSCESS N/A 06/17/2020   Procedure: IRRIGATION AND DEBRIDEMENT PERIRECTAL ABSCESS;  Surgeon: Leafy Ro, MD;  Location: [facility] ORS;  Service: General;  Laterality: N/A;   IR BONE MARROW BIOPSY & ASPIRATION  07/09/2021   LAPAROSCOPIC PARTIAL COLECTOMY     stage 3-C carcinoma of the cecum, s/p chemotherapy and xrt   LITHOTRIPSY     SIGMOIDOSCOPY  08/26/1993   THYROID LOBECTOMY  2002   s/p XRT   TOTAL HIP ARTHROPLASTY Right 10/24/2019   Procedure: TOTAL HIP ARTHROPLASTY;  Surgeon: Christena Flake, MD;  Location: [facility] ORS;  Service: Orthopedics;  Laterality: Right;    FAMILY HISTORY: Family History  Problem Relation Age of Onset   Stroke Mother        44s   Alzheimer's disease Mother    Lung cancer Father    Prostate cancer Father    Cancer Father        Colon   Colon cancer Father    Breast cancer Sister        58's   Lung cancer Sister    Breast cancer Maternal Aunt     ADVANCED DIRECTIVES (Y/N):  N  HEALTH MAINTENANCE: Social History   Tobacco Use   Smoking status: Never   Smokeless tobacco: Never  Vaping Use   Vaping status: Never Used  Substance Use Topics   Alcohol use: No    Alcohol/week: 0.0 standard drinks of alcohol   Drug use: No     Colonoscopy:  PAP:  Bone density:  Lipid panel:  Allergies  Allergen Reactions   Demeclocycline Other (See Comments)    Throat swells   Sulfa Antibiotics Other (See Comments)    Other reaction(s): Other (See Comments) Throat swells Other reaction(s): Other (See Comments) Throat swells Throat swells Other reaction(s): Other (See Comments) Throat swells Other reaction(s): Unknown Other reaction(s): Other (See Comments) Throat swells Other reaction(s): Other (See Comments) Other reaction(s): Other (See Comments) Throat swellsOther reaction(s): Other (See Comments) Throat swells   Tetracyclines & Related Other (See Comments)    Throat swells   Chlordiazepoxide    Dicyclomine     Levofloxacin Diarrhea    Severe diarrhea   Augmentin [Amoxicillin-Pot Clavulanate] Diarrhea   Bentyl [Dicyclomine Hcl]     unkn   Ciprofloxacin Diarrhea   Codeine Other (See Comments)    dizziness     Dicyclomine Hcl Other (See Comments)    unkn   Epinephrine     Speeds heart up - panic    Flagyl [Metronidazole] Nausea And Vomiting   Librax [Chlordiazepoxide-Clidinium]     unkn   Novocain [Procaine] Other (See Comments)    "Shaky"   Phenobarbital     unkn   Prednisone     Nervous     Tramadol Other (See Comments)    Sick feeling    Current Outpatient Medications  Medication Sig Dispense Refill   amitriptyline (ELAVIL) 10 MG tablet Take 10 mg by mouth in the morning and at bedtime.     Azelastine-Fluticasone 137-50 MCG/ACT  SUSP SPRAY 2 SPRAYS INTO EACH NOSTRIL EVERY DAY 23 g 2   carbidopa-levodopa (SINEMET IR) 25-100 MG tablet Take 1 tablet by mouth 3 (three) times daily.     clonazePAM (KLONOPIN) 0.5 MG tablet Take 1 tablet (0.5 mg total) by mouth 3 (three) times daily as needed for anxiety. 30 tablet 0   escitalopram (LEXAPRO) 20 MG tablet Take 20 mg by mouth every morning.      famotidine (PEPCID) 20 MG tablet Take 1 tablet (20 mg total) by mouth daily as needed for heartburn or indigestion. 90 tablet 0   levothyroxine (SYNTHROID) 88 MCG tablet Take 1 tablet (88 mcg total) by mouth daily. 90 tablet 1   nortriptyline (PAMELOR) 25 MG capsule Take 25 mg by mouth 2 (two) times daily.     olutasidenib (REZLIDHIA) 150 MG capsule Take 1 capsule (150 mg total) by mouth daily. Take on an empty stomach at least 1 hour before or 2 hours after a meal. 30 capsule 2   pantoprazole (PROTONIX) 40 MG tablet Take 1 tablet (40 mg total) by mouth 2 (two) times daily before a meal. 180 tablet 1   potassium chloride (KLOR-CON M) 10 MEQ tablet TAKE ONE TABLET BY MOUTH TWICE DAILY 60 tablet 2   prochlorperazine (COMPAZINE) 10 MG tablet Take 1 tablet (10 mg total) by mouth every 6 (six) hours as  needed for nausea or vomiting. 30 tablet 3   valACYclovir (VALTREX) 500 MG tablet TAKE ONE TABLET BY MOUTH DAILY 30 tablet 2   No current facility-administered medications for this visit.    OBJECTIVE: Vitals:   07/27/23 1415  BP: 111/65  Pulse: 89  Resp: 16  Temp: (!) 96 F (35.6 C)  SpO2: 100%     Body mass index is 21.22 kg/m.    ECOG FS:0 - Asymptomatic  General: Well-developed, well-nourished, no acute distress. Eyes: Pink conjunctiva, anicteric sclera. HEENT: Normocephalic, moist mucous membranes. Lungs: No audible wheezing or coughing. Heart: Regular rate and rhythm. Abdomen: Soft, nontender, no obvious distention. Musculoskeletal: No edema, cyanosis, or clubbing. Neuro: Alert, answering all questions appropriately. Cranial nerves grossly intact. Skin: No rashes or petechiae noted. Psych: Normal affect.  LAB RESULTS:  Lab Results  Component Value Date   NA 140 07/27/2023   K 3.6 07/27/2023   CL 103 07/27/2023   CO2 26 07/27/2023   GLUCOSE 79 07/27/2023   BUN 17 07/27/2023   CREATININE 0.94 07/27/2023   CALCIUM 8.8 (L) 07/27/2023   PROT 6.4 (L) 07/27/2023   ALBUMIN 3.9 07/27/2023   AST 12 (L) 07/27/2023   ALT 21 07/27/2023   ALKPHOS 58 07/27/2023   BILITOT 0.9 07/27/2023   GFRNONAA >60 07/27/2023   GFRAA 54 (L) 10/26/2019    Lab Results  Component Value Date   WBC 3.9 (L) 07/27/2023   NEUTROABS 2.6 07/27/2023   HGB 11.3 (L) 07/27/2023   HCT 31.8 (L) 07/27/2023   MCV 104.3 (H) 07/27/2023   PLT 128 (L) 07/27/2023     STUDIES: No results found.   ASSESSMENT: AML.  PLAN:    AML: Confirmed by bone marrow biopsy.  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  Patient's most recent bone marrow biopsy on March 23, 2022 revealed an increase of her blast count from 4% previously to up to 15% currently.  She last received treatment  with cycle 22 of Vidaza on February 13, 2022.  Given her progressive disease and  IDH-1 mutation, Vidaza was discontinued and patient initially received 150 mg twice daily of Olutasidenib (Rezlidhia).  This has been now dose reduce to once per day secondary to nausea and thrombocytopenia.  Patient requires a repeat bone marrow biopsy in the near future, but she wishes to defer this procedure given her underlying anxiety and new diagnosis of Parkinson's.  Return to clinic in 4 weeks for laboratory work and evaluation by clinical pharmacy.  Patient will then return to clinic in 8 weeks for further evaluation and continuation of treatment.   Thrombocytopenia: Chronic and unchanged.  Patient's platelet count is 128 today.  Continue dose reduced treatment once per day as above. Leukopenia: Mild, monitor.  Total white blood cell count is 3.9. Anemia: Mild, monitor.  Patient's hemoglobin is 11.3. Diarrhea: Resolved.  Continue Lomotil as needed.   Nausea: Patient states this is worse, unclear if she is taking ondansetron or Compazine.   Parkinson's: Continue treatment and management per neurology. 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  Patient also received chemotherapy and likely received Adriamycin, but exact regimen is unknown.  She completed 5 years of hormonal therapy in approximately June 2015. Currently, she has no evidence of disease.  Her most recent mammogram on July 29, 2021 was reported as BI-RADS 2.   History of colon cancer: Patient also states that she received chemotherapy for this, possibly FOLFOX but again this is unknown. Hair thinning: Improving.  Likely related to thyroid dosing. Psychiatric/anxiety/tremor: Patient now on amitriptyline and carbidopa/levodopa.  Continue follow-up and treatment per primary  psychiatrist. Laryngitis: Patient does not complain of this today.  Patient expressed understanding and was in agreement with this plan. She also understands that She can call clinic at any time with any questions, concerns, or complaints.    Cancer Staging  History of breast cancer Staging form: Breast, AJCC 7th Edition - Clinical stage from 01/07/2016: Stage IA (T1c, N0, M0) - Signed by Jeralyn Ruths, MD on 01/07/2016 Laterality: Right Estrogen receptor status: Positive Progesterone receptor status: Positive HER2 status: Negative   Jeralyn Ruths, MD   07/27/2023 3:20 PM

## 2023-07-29 ENCOUNTER — Other Ambulatory Visit: Payer: Self-pay

## 2023-08-02 ENCOUNTER — Other Ambulatory Visit (HOSPITAL_COMMUNITY): Payer: Self-pay

## 2023-08-04 ENCOUNTER — Other Ambulatory Visit (HOSPITAL_COMMUNITY): Payer: Self-pay

## 2023-08-04 ENCOUNTER — Other Ambulatory Visit: Payer: Self-pay

## 2023-08-04 NOTE — Progress Notes (Signed)
 Specialty Pharmacy Refill Coordination Note  Debra Hale is a 73 y.o. female contacted today regarding refills of specialty medication(s) Olutasidenib Tamala Fothergill)   Patient requested Delivery   Delivery date: 08/06/23   Verified address: [address]   [place] [address]   Medication will be filled on 08/05/23.

## 2023-08-05 ENCOUNTER — Telehealth: Payer: Self-pay | Admitting: Internal Medicine

## 2023-08-05 ENCOUNTER — Other Ambulatory Visit: Payer: Self-pay

## 2023-08-05 DIAGNOSIS — Z1231 Encounter for screening mammogram for malignant neoplasm of breast: Secondary | ICD-10-CM

## 2023-08-05 NOTE — Telephone Encounter (Signed)
 I received notification - overdue a mammogram.  Need to schedule.  I have ordered.

## 2023-08-05 NOTE — Telephone Encounter (Signed)
 Will discuss at 4/8 appt.

## 2023-08-06 ENCOUNTER — Other Ambulatory Visit: Payer: Self-pay

## 2023-08-10 ENCOUNTER — Other Ambulatory Visit: Payer: Self-pay | Admitting: Pharmacist

## 2023-08-10 ENCOUNTER — Ambulatory Visit: Payer: PPO | Admitting: Internal Medicine

## 2023-08-10 ENCOUNTER — Encounter: Payer: Self-pay | Admitting: Internal Medicine

## 2023-08-10 VITALS — BP 112/68 | HR 90 | Temp 98.0°F | Resp 16 | Ht 64.0 in | Wt 121.0 lb

## 2023-08-10 DIAGNOSIS — Z853 Personal history of malignant neoplasm of breast: Secondary | ICD-10-CM

## 2023-08-10 DIAGNOSIS — Z85850 Personal history of malignant neoplasm of thyroid: Secondary | ICD-10-CM

## 2023-08-10 DIAGNOSIS — K219 Gastro-esophageal reflux disease without esophagitis: Secondary | ICD-10-CM

## 2023-08-10 DIAGNOSIS — C9200 Acute myeloblastic leukemia, not having achieved remission: Secondary | ICD-10-CM | POA: Diagnosis not present

## 2023-08-10 DIAGNOSIS — D61818 Other pancytopenia: Secondary | ICD-10-CM

## 2023-08-10 DIAGNOSIS — R251 Tremor, unspecified: Secondary | ICD-10-CM

## 2023-08-10 DIAGNOSIS — R319 Hematuria, unspecified: Secondary | ICD-10-CM | POA: Diagnosis not present

## 2023-08-10 DIAGNOSIS — G609 Hereditary and idiopathic neuropathy, unspecified: Secondary | ICD-10-CM | POA: Diagnosis not present

## 2023-08-10 DIAGNOSIS — E039 Hypothyroidism, unspecified: Secondary | ICD-10-CM

## 2023-08-10 DIAGNOSIS — E7800 Pure hypercholesterolemia, unspecified: Secondary | ICD-10-CM | POA: Diagnosis not present

## 2023-08-10 DIAGNOSIS — F320 Major depressive disorder, single episode, mild: Secondary | ICD-10-CM | POA: Diagnosis not present

## 2023-08-10 DIAGNOSIS — Z85038 Personal history of other malignant neoplasm of large intestine: Secondary | ICD-10-CM

## 2023-08-10 DIAGNOSIS — E559 Vitamin D deficiency, unspecified: Secondary | ICD-10-CM

## 2023-08-10 DIAGNOSIS — F439 Reaction to severe stress, unspecified: Secondary | ICD-10-CM

## 2023-08-10 DIAGNOSIS — I5032 Chronic diastolic (congestive) heart failure: Secondary | ICD-10-CM

## 2023-08-10 NOTE — Progress Notes (Signed)
 Subjective:    Patient ID: Debra Hale, female    DOB: 1951-02-11, 73 y.o.   MRN: [id number]  Patient here for  Chief Complaint  Patient presents with   Medical Management of Chronic Issues    HPI Here for a scheduled follow up. She is accompanied gy her friend. History obtained from both of them. Recently diagnosed with parkinsons. On sinemet. 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 Last evaluated 07/27/23. No changes made. She saw neurology 07/26/23 - improved hand tremors with sinemet. Also, improvement in neuropathy with increase in nortriptyline. Referred to PT - for balance and gait. Recommended wrist brace for carpal tunnel. She reports that she has noticed some increased "queeziness" - middday. She was questioning if related to sinemet. Also has recently noticed some trouble swallowing, specifically pills and certain food. On questioning her, it is unclear how she is taking her medication. Sounds like she is not taking her protonix as directed. Will need to clarify her medication when she gets home. Breathing stable. No increased diarrhea.    Past Medical History:  Diagnosis Date   Abdominal pain 03/06/2016   Acute pericarditis 05/31/2013   Anxiety    Arthritis    Osteoarthritis   Breast cancer (HCC) 2009   right breast lumpectomy with rad tx   Colon cancer (HCC)    surgery with chemo and rad tx   Complication of anesthesia    GERD (gastroesophageal reflux disease)    Heart palpitations    History of hiatal hernia    History of kidney stones    History of thyroid cancer 09/18/2008   Qualifier: Diagnosis of   By: Andrena Bang CMA, Carol       Hypothyroidism    Liver disease    Liver nodule    s/p negative biopsy   Malignant neoplasm of thyroid gland (HCC) 2002   s/p surgery and XRT   Osteoporosis    Other and unspecified hyperlipidemia    Palpitations    Personal history of chemotherapy    Personal history of malignant neoplasm of large intestine    carcinoma - cecum, s/p  right laparoscopic colectomy - s/p chemotherapy and XRT   Personal history of radiation therapy    Pneumonia 2019   PONV (postoperative nausea and vomiting)    Pure hypercholesterolemia    Unspecified hereditary and idiopathic peripheral neuropathy    Past Surgical History:  Procedure Laterality Date   APPENDECTOMY  1985   BREAST BIOPSY Right 2009   positive   BREAST BIOPSY Right 2009   negative   BREAST LUMPECTOMY Right 2009   breast cancer   CHOLECYSTECTOMY  1995   COLONOSCOPY     COLONOSCOPY WITH PROPOFOL N/A 10/29/2017   Procedure: COLONOSCOPY WITH PROPOFOL;  Surgeon: Cassie Click, MD;  Location: [facility] ENDOSCOPY;  Service: Endoscopy;  Laterality: N/A;   DILATION AND CURETTAGE OF UTERUS  1990   DILATION AND CURETTAGE, DIAGNOSTIC / THERAPEUTIC  1990   ESOPHAGOGASTRODUODENOSCOPY     ESOPHAGOGASTRODUODENOSCOPY (EGD) WITH PROPOFOL N/A 10/29/2017   Procedure: ESOPHAGOGASTRODUODENOSCOPY (EGD) WITH PROPOFOL;  Surgeon: Cassie Click, MD;  Location: [facility] ENDOSCOPY;  Service: Endoscopy;  Laterality: N/A;   INCISION AND DRAINAGE PERIRECTAL ABSCESS N/A 06/17/2020   Procedure: IRRIGATION AND DEBRIDEMENT PERIRECTAL ABSCESS;  Surgeon: Alben Alma, MD;  Location: [facility] ORS;  Service: General;  Laterality: N/A;   IR BONE MARROW BIOPSY & ASPIRATION  07/09/2021   LAPAROSCOPIC PARTIAL COLECTOMY     stage 3-C carcinoma of  the cecum, s/p chemotherapy and xrt   LITHOTRIPSY     SIGMOIDOSCOPY  08/26/1993   THYROID LOBECTOMY  2002   s/p XRT   TOTAL HIP ARTHROPLASTY Right 10/24/2019   Procedure: TOTAL HIP ARTHROPLASTY;  Surgeon: Elner Hahn, MD;  Location: [facility] ORS;  Service: Orthopedics;  Laterality: Right;   Family History  Problem Relation Age of Onset   Stroke Mother        54s   Alzheimer's disease Mother    Lung cancer Father    Prostate cancer Father    Cancer Father        Colon   Colon cancer Father    Breast cancer Sister        61's   Lung cancer Sister    Breast cancer  Maternal Aunt    Social History   Socioeconomic History   Marital status: Single    Spouse name: Not on file   Number of children: 0   Years of education: Not on file   Highest education level: Not on file  Occupational History   Occupation: Retired  Tobacco Use   Smoking status: Never   Smokeless tobacco: Never  Vaping Use   Vaping status: Never Used  Substance and Sexual Activity   Alcohol use: No    Alcohol/week: 0.0 standard drinks of alcohol   Drug use: No   Sexual activity: Not Currently  Other Topics Concern   Not on file  Social History Narrative   Not on file   Social Drivers of Health   Financial Resource Strain: Low Risk  (09/18/2022)   Overall Financial Resource Strain (CARDIA)    Difficulty of Paying Living Expenses: Not hard at all  Food Insecurity: No Food Insecurity (09/18/2022)   Hunger Vital Sign    Worried About Running Out of Food in the Last Year: Never true    Ran Out of Food in the Last Year: Never true  Transportation Needs: No Transportation Needs (09/18/2022)   PRAPARE - Administrator, Civil Service (Medical): No    Lack of Transportation (Non-Medical): No  Physical Activity: Insufficiently Active (09/18/2022)   Exercise Vital Sign    Days of Exercise per Week: 1 day    Minutes of Exercise per Session: 10 min  Stress: Stress Concern Present (09/18/2022)   [employer] of Occupational Health - Occupational Stress Questionnaire    Feeling of Stress : To some extent  Social Connections: Moderately Isolated (09/18/2022)   Social Connection and Isolation Panel [NHANES]    Frequency of Communication with Friends and Family: More than three times a week    Frequency of Social Gatherings with Friends and Family: Twice a week    Attends Religious Services: 1 to 4 times per year    Active Member of [employer] or Organizations: No    Attends Banker Meetings: Never    Marital Status: Divorced     Review of Systems   Constitutional:  Negative for appetite change and unexpected weight change.  HENT:  Negative for congestion and sinus pressure.   Respiratory:  Negative for cough, chest tightness and shortness of breath.   Cardiovascular:  Negative for chest pain, palpitations and leg swelling.  Gastrointestinal:  Negative for abdominal pain, diarrhea and vomiting.       Issues with swallowing as outlined. "Queezy".   Genitourinary:  Negative for difficulty urinating and dysuria.  Musculoskeletal:  Negative for joint swelling and myalgias.  Skin:  Negative for  color change and rash.  Neurological:  Negative for dizziness and headaches.  Psychiatric/Behavioral:  Negative for agitation and dysphoric mood.        Objective:     BP 112/68   Pulse 90   Temp 98 F (36.7 C)   Resp 16   Ht 5\' 4"  (1.626 m)   Wt 121 lb (54.9 kg)   SpO2 99%   BMI 20.77 kg/m  Wt Readings from Last 3 Encounters:  08/10/23 121 lb (54.9 kg)  07/27/23 123 lb 9.6 oz (56.1 kg)  05/31/23 123 lb 9.6 oz (56.1 kg)    Physical Exam Vitals reviewed.  Constitutional:      General: She is not in acute distress.    Appearance: Normal appearance.  HENT:     Head: Normocephalic and atraumatic.     Right Ear: External ear normal.     Left Ear: External ear normal.     Mouth/Throat:     Pharynx: No oropharyngeal exudate or posterior oropharyngeal erythema.  Eyes:     General: No scleral icterus.       Right eye: No discharge.        Left eye: No discharge.     Conjunctiva/sclera: Conjunctivae normal.  Neck:     Thyroid: No thyromegaly.  Cardiovascular:     Rate and Rhythm: Normal rate and regular rhythm.  Pulmonary:     Effort: No respiratory distress.     Breath sounds: Normal breath sounds. No wheezing.  Abdominal:     General: Bowel sounds are normal.     Palpations: Abdomen is soft.     Tenderness: There is no abdominal tenderness.  Musculoskeletal:        General: No swelling or tenderness.     Cervical back:  Neck supple. No tenderness.  Lymphadenopathy:     Cervical: No cervical adenopathy.  Skin:    Findings: No erythema or rash.  Neurological:     Mental Status: She is alert.  Psychiatric:        Mood and Affect: Mood normal.        Behavior: Behavior normal.         Outpatient Encounter Medications as of 08/10/2023  Medication Sig   Azelastine-Fluticasone 137-50 MCG/ACT SUSP SPRAY 2 SPRAYS INTO EACH NOSTRIL EVERY DAY   clonazePAM (KLONOPIN) 0.5 MG tablet Take 1 tablet (0.5 mg total) by mouth 3 (three) times daily as needed for anxiety.   escitalopram (LEXAPRO) 20 MG tablet Take 20 mg by mouth every morning.    nortriptyline (PAMELOR) 25 MG capsule Take 25 mg by mouth 2 (two) times daily.   olutasidenib (REZLIDHIA) 150 MG capsule Take 1 capsule (150 mg total) by mouth daily. Take on an empty stomach at least 1 hour before or 2 hours after a meal.   pantoprazole (PROTONIX) 40 MG tablet Take 1 tablet (40 mg total) by mouth 2 (two) times daily before a meal.   potassium chloride (KLOR-CON M) 10 MEQ tablet TAKE ONE TABLET BY MOUTH TWICE DAILY   prochlorperazine (COMPAZINE) 10 MG tablet Take 1 tablet (10 mg total) by mouth every 6 (six) hours as needed for nausea or vomiting.   [DISCONTINUED] amitriptyline (ELAVIL) 10 MG tablet Take 10 mg by mouth in the morning and at bedtime.   [DISCONTINUED] carbidopa-levodopa (SINEMET IR) 25-100 MG tablet Take 1 tablet by mouth 3 (three) times daily.   [DISCONTINUED] famotidine (PEPCID) 20 MG tablet Take 1 tablet (20 mg total) by mouth daily as  needed for heartburn or indigestion.   [DISCONTINUED] levothyroxine (SYNTHROID) 88 MCG tablet Take 1 tablet (88 mcg total) by mouth daily.   [DISCONTINUED] valACYclovir (VALTREX) 500 MG tablet TAKE ONE TABLET BY MOUTH DAILY   No facility-administered encounter medications on file as of 08/10/2023.     Lab Results  Component Value Date   WBC 3.9 (L) 07/27/2023   HGB 11.3 (L) 07/27/2023   HCT 31.8 (L) 07/27/2023    PLT 128 (L) 07/27/2023   GLUCOSE 103 (H) 08/10/2023   CHOL 180 08/10/2023   TRIG 104.0 08/10/2023   HDL 56.10 08/10/2023   LDLDIRECT 126.0 07/11/2015   LDLCALC 103 (H) 08/10/2023   ALT 21 07/27/2023   AST 12 (L) 07/27/2023   NA 141 08/10/2023   K 4.0 08/10/2023   CL 104 08/10/2023   CREATININE 0.98 08/10/2023   BUN 14 08/10/2023   CO2 28 08/10/2023   TSH 0.07 (L) 08/10/2023   INR 1.3 (H) 04/04/2022   HGBA1C 5.3 12/10/2016       Assessment & Plan:  Hypercholesterolemia Assessment & Plan: Follow lipid panel.   Orders: -     Lipid panel -     Basic metabolic panel with GFR  Hypothyroidism, unspecified type Assessment & Plan: Continues on synthroid. Follow tsh.   Orders: -     TSH  Hypomagnesemia Assessment & Plan: Check magnesium level today.   Orders: -     Magnesium  Vitamin D deficiency Assessment & Plan: Check vitamin d level with next labs.   Orders: -     VITAMIN D 25 Hydroxy (Vit-D Deficiency, Fractures); Future  Tremor Assessment & Plan: Tremors - improved with sinemet. Recent diagnosis of parkinsons.  Continue to monitor thyroid.    Stress Assessment & Plan: Increased stress. Is followed by psychiatry. Continue lexapro and clonazepam. Also taking nortiptyline. Appears to be doing better.  Follow. Continue f/u with Dr Arline Laity.    Pancytopenia [facility]) Assessment & Plan: Hematology following. Undergoing treatment.    Major depressive disorder, single episode, mild (HCC) Assessment & Plan: Continues on lexapro and clonazepam. Followed by Dr Arline Laity. Appears to be doing better.    History of thyroid cancer Assessment & Plan: Continue synthroid. Follow tsh.    History of malignant neoplasm of large intestine Assessment & Plan: Colonoscopy 10/2017.  Being followed by GI. Was due f/u 2020.  Had colonoscopy scheduled.  Canceled due to hip surgery.  Overdue f/u.  Currently being treated for AML.  No problems with her bowels currently. Confirm  f/u with GI.    History of breast cancer Assessment & Plan: Mammogram 07/29/21 - Birads II. Due f/u mammogram.  Discussed today. She declines f/u mammogram.    Hereditary and idiopathic peripheral neuropathy Assessment & Plan: Per review, doing better on nortriptyline. Follow.    Hematuria, unspecified type Assessment & Plan: Needs a f/u urinalysis.    Gastroesophageal reflux disease, unspecified whether esophagitis present Assessment & Plan: Has been on protonix. Unclear how she is taking now. Has noticed some intermittent queeziness and some issues with swallowing as outlined. She is going to confirm if taking protonix when she gets home. If not taking, restart. If taking, increase to bid. Discuss f/u with GI.    Chronic diastolic CHF (congestive heart failure) (HCC) Assessment & Plan: 2D echo on 09/05/2020 showed EF 60-65% with grade 1 diastolic dysfunction. No evidence of volume overload on exam.  Breathing stable.    Acute myeloid leukemia not having achieved remission [facility]) Assessment &  Plan: Seeing Dr Adrian Alba - f/u AML - on rezlidhia.  Following cbc.       Dellar Fenton, MD

## 2023-08-11 LAB — LIPID PANEL
Cholesterol: 180 mg/dL (ref 0–200)
HDL: 56.1 mg/dL (ref >39.00)
LDL Cholesterol: 103 mg/dL — ABNORMAL HIGH (ref 0–99)
NonHDL: 123.79
Total CHOL/HDL Ratio: 3
Triglycerides: 104 mg/dL (ref 0.0–149.0)
VLDL: 20.8 mg/dL (ref 0.0–40.0)

## 2023-08-11 LAB — BASIC METABOLIC PANEL WITH GFR
BUN: 14 mg/dL (ref 6–23)
CO2: 28 meq/L (ref 19–32)
Calcium: 9.2 mg/dL (ref 8.4–10.5)
Chloride: 104 meq/L (ref 96–112)
Creatinine, Ser: 0.98 mg/dL (ref 0.40–1.20)
GFR: 57.17 mL/min — ABNORMAL LOW (ref >60.00)
Glucose, Bld: 103 mg/dL — ABNORMAL HIGH (ref 70–99)
Potassium: 4 meq/L (ref 3.5–5.1)
Sodium: 141 meq/L (ref 135–145)

## 2023-08-11 LAB — MAGNESIUM: Magnesium: 1.8 mg/dL (ref 1.5–2.5)

## 2023-08-11 LAB — TSH: TSH: 0.07 u[IU]/mL — ABNORMAL LOW (ref 0.35–5.50)

## 2023-08-12 ENCOUNTER — Other Ambulatory Visit: Payer: Self-pay

## 2023-08-12 ENCOUNTER — Telehealth: Payer: Self-pay

## 2023-08-12 NOTE — Telephone Encounter (Signed)
 Copied from CRM [id number]. Topic: Clinical - Lab/Test Results >> Aug 12, 2023  9:34 AM Almira Coaster wrote: Reason for CRM: Patient is returning a call she received to go over the lab work, I advised patient of the message Dr.Scott left . Patient understood and is in agreement to start cholesterol medication, she would also decrease her synthroid to 75 mcg.  Patient has additional questions that she would like to discuss with the nurse did not want to disclose.

## 2023-08-12 NOTE — Telephone Encounter (Signed)
 Spoke with patient to make her aware of recent lab results and recommendations. See result note in patient's chart.

## 2023-08-13 ENCOUNTER — Other Ambulatory Visit: Payer: Self-pay | Admitting: Internal Medicine

## 2023-08-13 DIAGNOSIS — E039 Hypothyroidism, unspecified: Secondary | ICD-10-CM

## 2023-08-13 DIAGNOSIS — E7800 Pure hypercholesterolemia, unspecified: Secondary | ICD-10-CM

## 2023-08-13 MED ORDER — LEVOTHYROXINE SODIUM 75 MCG PO TABS
75.0000 ug | ORAL_TABLET | Freq: Every day | ORAL | 3 refills | Status: DC
Start: 2023-08-13 — End: 2023-10-05

## 2023-08-13 MED ORDER — ROSUVASTATIN CALCIUM 5 MG PO TABS: 5.0000 mg | ORAL_TABLET | Freq: Every day | ORAL | 2 refills | Status: DC

## 2023-08-13 NOTE — Progress Notes (Signed)
Orders placed for f/u labs.  

## 2023-08-14 ENCOUNTER — Encounter: Payer: Self-pay | Admitting: Internal Medicine

## 2023-08-14 NOTE — Assessment & Plan Note (Signed)
Check magnesium level today. 

## 2023-08-14 NOTE — Assessment & Plan Note (Signed)
 Hematology following. Undergoing treatment.

## 2023-08-14 NOTE — Assessment & Plan Note (Signed)
 Increased stress. Is followed by psychiatry. Continue lexapro and clonazepam. Also taking nortiptyline. Appears to be doing better.  Follow. Continue f/u with Dr Arline Laity.

## 2023-08-14 NOTE — Assessment & Plan Note (Signed)
 Follow lipid panel.

## 2023-08-14 NOTE — Assessment & Plan Note (Signed)
 Tremors - improved with sinemet. Recent diagnosis of parkinsons.  Continue to monitor thyroid.

## 2023-08-14 NOTE — Assessment & Plan Note (Signed)
 Colonoscopy 10/2017.  Being followed by GI. Was due f/u 2020.  Had colonoscopy scheduled.  Canceled due to hip surgery.  Overdue f/u.  Currently being treated for AML.  No problems with her bowels currently. Confirm f/u with GI.

## 2023-08-14 NOTE — Assessment & Plan Note (Signed)
 2D echo on 09/05/2020 showed EF 60-65% with grade 1 diastolic dysfunction. No evidence of volume overload on exam.  Breathing stable.

## 2023-08-14 NOTE — Assessment & Plan Note (Signed)
Needs a f/u urinalysis.

## 2023-08-14 NOTE — Assessment & Plan Note (Signed)
 Continues on synthroid. Follow tsh.

## 2023-08-14 NOTE — Assessment & Plan Note (Signed)
 Continue synthroid. Follow tsh.

## 2023-08-14 NOTE — Assessment & Plan Note (Signed)
 Has been on protonix. Unclear how she is taking now. Has noticed some intermittent queeziness and some issues with swallowing as outlined. She is going to confirm if taking protonix when she gets home. If not taking, restart. If taking, increase to bid. Discuss f/u with GI.

## 2023-08-14 NOTE — Assessment & Plan Note (Signed)
Check vitamin d level with next labs.  

## 2023-08-14 NOTE — Assessment & Plan Note (Signed)
 Continues on lexapro and clonazepam. Followed by Dr Arline Laity. Appears to be doing better.

## 2023-08-14 NOTE — Assessment & Plan Note (Signed)
 Seeing Dr Adrian Alba - f/u AML - on rezlidhia.  Following cbc.

## 2023-08-14 NOTE — Assessment & Plan Note (Signed)
 Mammogram 07/29/21 - Birads II. Due f/u mammogram.  Discussed today. She declines f/u mammogram.

## 2023-08-14 NOTE — Assessment & Plan Note (Signed)
 Per review, doing better on nortriptyline. Follow.

## 2023-08-23 ENCOUNTER — Inpatient Hospital Stay: Admitting: Pharmacist

## 2023-08-23 ENCOUNTER — Telehealth: Payer: Self-pay

## 2023-08-23 ENCOUNTER — Other Ambulatory Visit: Payer: Self-pay | Admitting: Pharmacist

## 2023-08-23 ENCOUNTER — Inpatient Hospital Stay: Attending: Oncology

## 2023-08-23 DIAGNOSIS — Z79899 Other long term (current) drug therapy: Secondary | ICD-10-CM | POA: Insufficient documentation

## 2023-08-23 DIAGNOSIS — C9200 Acute myeloblastic leukemia, not having achieved remission: Secondary | ICD-10-CM | POA: Diagnosis not present

## 2023-08-23 LAB — CBC WITH DIFFERENTIAL/PLATELET
Abs Immature Granulocytes: 0 10*3/uL (ref 0.00–0.07)
Basophils Absolute: 0 10*3/uL (ref 0.0–0.1)
Basophils Relative: 1 %
Eosinophils Absolute: 0.1 10*3/uL (ref 0.0–0.5)
Eosinophils Relative: 3 %
HCT: 32.6 % — ABNORMAL LOW (ref 36.0–46.0)
Hemoglobin: 11.5 g/dL — ABNORMAL LOW (ref 12.0–15.0)
Lymphocytes Relative: 21 %
Lymphs Abs: 0.8 10*3/uL (ref 0.7–4.0)
MCH: 37 pg — ABNORMAL HIGH (ref 26.0–34.0)
MCHC: 35.3 g/dL (ref 30.0–36.0)
MCV: 104.8 fL — ABNORMAL HIGH (ref 80.0–100.0)
Monocytes Absolute: 0.1 10*3/uL (ref 0.1–1.0)
Monocytes Relative: 4 %
Neutro Abs: 2.6 10*3/uL (ref 1.7–7.7)
Neutrophils Relative %: 71 %
Platelets: 110 10*3/uL — ABNORMAL LOW (ref 150–400)
RBC: 3.11 MIL/uL — ABNORMAL LOW (ref 3.87–5.11)
RDW: 15.6 % — ABNORMAL HIGH (ref 11.5–15.5)
Smear Review: NORMAL
WBC: 3.6 10*3/uL — ABNORMAL LOW (ref 4.0–10.5)
nRBC: 0 % (ref 0.0–0.2)

## 2023-08-23 LAB — COMPREHENSIVE METABOLIC PANEL WITH GFR
ALT: 7 U/L (ref 0–44)
AST: 13 U/L — ABNORMAL LOW (ref 15–41)
Albumin: 4 g/dL (ref 3.5–5.0)
Alkaline Phosphatase: 57 U/L (ref 38–126)
Anion gap: 7 (ref 5–15)
BUN: 13 mg/dL (ref 8–23)
CO2: 28 mmol/L (ref 22–32)
Calcium: 8.6 mg/dL — ABNORMAL LOW (ref 8.9–10.3)
Chloride: 104 mmol/L (ref 98–111)
Creatinine, Ser: 0.82 mg/dL (ref 0.44–1.00)
GFR, Estimated: >60 mL/min (ref >60)
Glucose, Bld: 94 mg/dL (ref 70–99)
Potassium: 3.7 mmol/L (ref 3.5–5.1)
Sodium: 139 mmol/L (ref 135–145)
Total Bilirubin: 0.8 mg/dL (ref 0.0–1.2)
Total Protein: 6.5 g/dL (ref 6.5–8.1)

## 2023-08-23 NOTE — Progress Notes (Signed)
 Oral Chemotherapy Clinic [facility]  [phone] [phone]  Patient Care Team: Dellar Fenton, MD as PCP - General (Internal Medicine) Wenona Hamilton, MD as PCP - Cardiology (Cardiology) Shellie Dials, MD as Consulting Physician (Oncology)   Name of the patient: Debra Hale  [id number]  16-Jan-1951   Encounter Date 08/23/2023  HPI: Patient is a 73 y.o. female with AML. Previously treated with azacitadine and venetoclax . A repeat BM biopsy showed disease progression. 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 She was dose reduced to olutasidenib  100mg  daily on 06/22/22 due to decreased pltc.   Reason for Consult: Oral chemotherapy follow-up for Rezlidhia  (olutasidenib ) therapy.   PAST MEDICAL HISTORY: Past Medical History:  Diagnosis Date   Abdominal pain 03/06/2016   Acute pericarditis 05/31/2013   Anxiety    Arthritis    Osteoarthritis   Breast cancer (HCC) 2009   right breast lumpectomy with rad tx   Colon cancer (HCC)    surgery with chemo and rad tx   Complication of anesthesia    GERD (gastroesophageal reflux disease)    Heart palpitations    History of hiatal hernia    History of kidney stones    History of thyroid  cancer 09/18/2008   Qualifier: Diagnosis of   By: Andrena Bang CMA, Carol       Hypothyroidism    Liver disease    Liver nodule    s/p negative biopsy   Malignant neoplasm of thyroid  gland (HCC) 2002   s/p surgery and XRT   Osteoporosis    Other and unspecified hyperlipidemia    Palpitations    Personal history of chemotherapy    Personal history of malignant neoplasm of large intestine    carcinoma - cecum, s/p right laparoscopic colectomy - s/p chemotherapy and XRT   Personal history of radiation therapy    Pneumonia 2019   PONV (postoperative nausea and vomiting)    Pure hypercholesterolemia    Unspecified hereditary and idiopathic peripheral  neuropathy     HEMATOLOGY/ONCOLOGY HISTORY:  Oncology History  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 Initial Diagnosis   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HCC)   06/10/2020 - 12/19/2021 Chemotherapy   Patient is on Treatment Plan : AML dose reduced azacitidine  SQ D1-5 q28d     06/10/2020 - 02/13/2022 Chemotherapy   Patient is on Treatment Plan : AML Azacitidine  SQ D1-5 q28d       ALLERGIES:  is allergic to demeclocycline, sulfa  antibiotics, tetracyclines & related, chlordiazepoxide, dicyclomine, levofloxacin , augmentin  [amoxicillin -pot clavulanate], bentyl [dicyclomine hcl], ciprofloxacin , codeine, dicyclomine hcl, epinephrine , flagyl  [metronidazole ], librax [chlordiazepoxide-clidinium], novocain [procaine], phenobarbital, prednisone , and tramadol.  MEDICATIONS:  Current Outpatient Medications  Medication Sig Dispense Refill   Azelastine -Fluticasone  137-50 MCG/ACT SUSP SPRAY 2 SPRAYS INTO EACH NOSTRIL EVERY DAY 23 g 2   clonazePAM  (KLONOPIN ) 0.5 MG tablet Take 1 tablet (0.5 mg total) by mouth 3 (three) times daily as needed for anxiety. 30 tablet 0   escitalopram  (LEXAPRO ) 20 MG tablet Take 20 mg by mouth every morning.      levothyroxine  (SYNTHROID ) 75 MCG tablet Take 1 tablet (75 mcg total) by mouth daily before breakfast. 30 tablet 3   nortriptyline (PAMELOR) 25 MG capsule Take 25 mg by mouth 2 (two) times daily.     olutasidenib  (REZLIDHIA ) 150 MG capsule Take 1 capsule (150 mg total) by mouth daily. Take on an empty stomach at least 1 hour before or 2 hours after  a meal. 30 capsule 2   pantoprazole  (PROTONIX ) 40 MG tablet Take 1 tablet (40 mg total) by mouth 2 (two) times daily before a meal. 180 tablet 1   potassium chloride  (KLOR-CON  M) 10 MEQ tablet TAKE ONE TABLET BY MOUTH TWICE DAILY 60 tablet 2   prochlorperazine  (COMPAZINE ) 10 MG tablet TAKE ONE TABLET BY MOUTH EVERY 6 HOURS AS NEEDED FOR NAUSEA / VOMITING 30 tablet 3   rosuvastatin  (CRESTOR ) 5 MG tablet Take 1  tablet (5 mg total) by mouth daily. 30 tablet 2   valACYclovir  (VALTREX ) 500 MG tablet TAKE ONE TABLET BY MOUTH DAILY 30 tablet 2   No current facility-administered medications for this visit.    VITAL SIGNS: There were no vitals taken for this visit. There were no vitals filed for this visit.     Estimated body mass index is 20.77 kg/m as calculated from the following:   Height as of 08/10/23: 5\' 4"  (1.626 m).   Weight as of 08/10/23: 54.9 kg (121 lb).  LABS: CBC:    Component Value Date/Time   WBC 3.6 (L) 08/23/2023 1259   HGB 11.5 (L) 08/23/2023 1259   HGB 12.5 05/09/2018 1147   HCT 32.6 (L) 08/23/2023 1259   HCT 36.8 05/09/2018 1147   PLT 110 (L) 08/23/2023 1259   PLT 285 05/09/2018 1147   MCV 104.8 (H) 08/23/2023 1259   MCV 86 05/09/2018 1147   MCV 89 11/30/2013 1419   NEUTROABS 2.6 08/23/2023 1259   NEUTROABS 2.9 11/20/2013 1404   LYMPHSABS 0.8 08/23/2023 1259   LYMPHSABS 0.9 (L) 11/20/2013 1404   MONOABS 0.1 08/23/2023 1259   MONOABS 0.2 11/20/2013 1404   EOSABS 0.1 08/23/2023 1259   EOSABS 0.1 11/20/2013 1404   BASOSABS 0.0 08/23/2023 1259   BASOSABS 0.0 11/20/2013 1404   Comprehensive Metabolic Panel:    Component Value Date/Time   NA 139 08/23/2023 1259   NA 143 05/09/2018 1147   NA 145 11/30/2013 1419   K 3.7 08/23/2023 1259   K 3.5 11/30/2013 1419   CL 104 08/23/2023 1259   CL 109 (H) 11/30/2013 1419   CO2 28 08/23/2023 1259   CO2 29 11/30/2013 1419   BUN 13 08/23/2023 1259   BUN 11 05/09/2018 1147   BUN 15 11/30/2013 1419   CREATININE 0.82 08/23/2023 1259   CREATININE 1.04 11/30/2013 1419   GLUCOSE 94 08/23/2023 1259   GLUCOSE 97 11/30/2013 1419   CALCIUM  8.6 (L) 08/23/2023 1259   CALCIUM  8.7 11/30/2013 1419   AST 13 (L) 08/23/2023 1259   AST 28 11/30/2013 1419   ALT 7 08/23/2023 1259   ALT 71 (H) 11/30/2013 1419   ALKPHOS 57 08/23/2023 1259   ALKPHOS 114 11/30/2013 1419   BILITOT 0.8 08/23/2023 1259   BILITOT 0.7 11/30/2013 1419   PROT  6.5 08/23/2023 1259   PROT 7.1 11/30/2013 1419   ALBUMIN 4.0 08/23/2023 1259   ALBUMIN 3.3 (L) 11/30/2013 1419     Present during today's visit: patient only  Assessment and Plan: CMP/CBC reviewed, continue reduced dose olutasidenib  150mg  daily.  Patient has agreed to complete bone marrow biopsy. Plan to have this completed prior to her appt with Dr. Adrian Alba next month. Tremors have decreased and she is able to complete daily task with more ease  Recently patient had constipation (5 days without bowel movement) requiring mag citrate, discussed starting Miralax for more long term constipation management if constipation returns in the next few days   Oral Chemotherapy  Adherence: No missed doses reported There are no patient barriers to medication adherence identified.   New medications: None reported   Medication Access Issues: No issues, patient fills at [facility].   Patient expressed understanding and was in agreement with this plan. She also understands that She can call clinic at any time with any questions, concerns, or complaints.   Follow-up plan: RTC in 4 weeks lab/MD, bone marrow biopsy prior to MD visit  Thank you for allowing me to participate in the care of this very pleasant patient.   Time Total: 15 minutes  Visit consisted of counseling and education on dealing with issues of symptom management in the setting of serious and potentially life-threatening illness.Greater than 50% of this time was spent counseling and coordinating care related to the above assessment and plan.  [place] Shannahan N. Nevea Spiewak, PharmD, BCOP, CPP Hematology/Oncology Clinical Pharmacist Practitioner [place] [phone]  08/23/2023 4:25 PM

## 2023-08-23 NOTE — Telephone Encounter (Signed)
 Copied from CRM [id number]. Topic: Clinical - Medical Advice >> Aug 23, 2023  3:53 PM [place] A wrote: Reason for CRM: Patient called and said that she was told to take Magnesium  but was not informed on the strength, brand or how often-Patient said she is not able to get on Mychart therefore call her

## 2023-08-24 ENCOUNTER — Telehealth: Payer: Self-pay | Admitting: *Deleted

## 2023-08-24 ENCOUNTER — Other Ambulatory Visit

## 2023-08-24 ENCOUNTER — Ambulatory Visit: Admitting: Pharmacist

## 2023-08-24 NOTE — Telephone Encounter (Signed)
 Pt aware the magnesium  glycinate was recommended.

## 2023-08-24 NOTE — Telephone Encounter (Signed)
 Copied from CRM [id number]. Topic: General - Other >> Aug 24, 2023  9:35 AM Kita Perish H wrote: Reason for CRM: Patient is returning call to office, please reach back out, thanks.  [place] [phone]

## 2023-08-24 NOTE — Telephone Encounter (Signed)
 Called patient. Unable to leave message due to voicemail being full.

## 2023-08-24 NOTE — Telephone Encounter (Signed)
 Call placed to patient regarding appointment for bone marrow biopsy. Patient is scheduled for bone marrow biopsy on 5/2 at 9:30 am. [facility] 8:30 am. Patient aware that she will need someone to drive her and that she will have to be NPO after midnight. Patient verbalized understanding and is in agreement with plan.   Patient also had questions regarding medications that can be taken for constipation. Advised that if patient has gone 2 days without a bowel movement she can take Miralax over the counter. Also advised that patient can take stool softener daily to help with bowel movement.

## 2023-08-30 ENCOUNTER — Other Ambulatory Visit: Payer: Self-pay

## 2023-09-01 NOTE — H&P (Signed)
 Chief Complaint: Acute Myeloid Leukemia - IR consulted for image guided bone marrow biopsy  Referring Provider(s): Adrian Alba Deadra Everts, MD   Supervising Physician: Myrlene Asper  Patient Status: [facility] - Out-pt  History of Present Illness: Debra Hale is a 73 y.o. female with known AML since 2021 presenting to IR service for CT bone marrow biopsy. She has undergone multiple different chemotherapy regimens since, most currently Olutasidenib . Her most recent bone marrow biopsy was 03/23/22 which showed increased of her blast cell count from 4% to 15%. Given she has been on continued chemotherapy and concern from last biopsy, IR has been consulted for repeat bone marrow biopsy to assess response to treatment. Oncology note does report pt was recently diagnosed with Parkinson's and has had increased stress/anxiety regarding this.    Patient is Full Code  Past Medical History:  Diagnosis Date   Abdominal pain 03/06/2016   Acute pericarditis 05/31/2013   Anxiety    Arthritis    Osteoarthritis   Breast cancer (HCC) 2009   right breast lumpectomy with rad tx   Colon cancer (HCC)    surgery with chemo and rad tx   Complication of anesthesia    GERD (gastroesophageal reflux disease)    Heart palpitations    History of hiatal hernia    History of kidney stones    History of thyroid  cancer 09/18/2008   Qualifier: Diagnosis of   By: Andrena Bang, CMA, Carol       Hypothyroidism    Liver disease    Liver nodule    s/p negative biopsy   Malignant neoplasm of thyroid  gland (HCC) 2002   s/p surgery and XRT   Osteoporosis    Other and unspecified hyperlipidemia    Palpitations    Personal history of chemotherapy    Personal history of malignant neoplasm of large intestine    carcinoma - cecum, s/p right laparoscopic colectomy - s/p chemotherapy and XRT   Personal history of radiation therapy    Pneumonia 2019   PONV (postoperative nausea and vomiting)    Pure hypercholesterolemia     Unspecified hereditary and idiopathic peripheral neuropathy     Past Surgical History:  Procedure Laterality Date   APPENDECTOMY  1985   BREAST BIOPSY Right 2009   positive   BREAST BIOPSY Right 2009   negative   BREAST LUMPECTOMY Right 2009   breast cancer   CHOLECYSTECTOMY  1995   COLONOSCOPY     COLONOSCOPY WITH PROPOFOL  N/A 10/29/2017   Procedure: COLONOSCOPY WITH PROPOFOL ;  Surgeon: Cassie Click, MD;  Location: [facility] ENDOSCOPY;  Service: Endoscopy;  Laterality: N/A;   DILATION AND CURETTAGE OF UTERUS  1990   DILATION AND CURETTAGE, DIAGNOSTIC / THERAPEUTIC  1990   ESOPHAGOGASTRODUODENOSCOPY     ESOPHAGOGASTRODUODENOSCOPY (EGD) WITH PROPOFOL  N/A 10/29/2017   Procedure: ESOPHAGOGASTRODUODENOSCOPY (EGD) WITH PROPOFOL ;  Surgeon: Cassie Click, MD;  Location: [facility] ENDOSCOPY;  Service: Endoscopy;  Laterality: N/A;   INCISION AND DRAINAGE PERIRECTAL ABSCESS N/A 06/17/2020   Procedure: IRRIGATION AND DEBRIDEMENT PERIRECTAL ABSCESS;  Surgeon: Alben Alma, MD;  Location: [facility] ORS;  Service: General;  Laterality: N/A;   IR BONE MARROW BIOPSY & ASPIRATION  07/09/2021   LAPAROSCOPIC PARTIAL COLECTOMY     stage 3-C carcinoma of the cecum, s/p chemotherapy and xrt   LITHOTRIPSY     SIGMOIDOSCOPY  08/26/1993   THYROID  LOBECTOMY  2002   s/p XRT   TOTAL HIP ARTHROPLASTY Right 10/24/2019   Procedure: TOTAL HIP  ARTHROPLASTY;  Surgeon: Elner Hahn, MD;  Location: [facility] ORS;  Service: Orthopedics;  Laterality: Right;    Allergies: Demeclocycline, Sulfa  antibiotics, Tetracyclines & related, Chlordiazepoxide, Dicyclomine, Levofloxacin , Augmentin  [amoxicillin -pot clavulanate], Bentyl [dicyclomine hcl], Ciprofloxacin , Codeine, Dicyclomine hcl, Epinephrine , Flagyl  [metronidazole ], Librax [chlordiazepoxide-clidinium], Novocain [procaine], Phenobarbital, Prednisone , and Tramadol  Medications: Prior to Admission medications   Medication Sig Start Date End Date Taking? Authorizing Provider   Azelastine -Fluticasone  137-50 MCG/ACT SUSP SPRAY 2 SPRAYS INTO EACH NOSTRIL EVERY DAY 03/29/23   Dellar Fenton, MD  clonazePAM  (KLONOPIN ) 0.5 MG tablet Take 1 tablet (0.5 mg total) by mouth 3 (three) times daily as needed for anxiety. 04/02/22   Ghimire, Kuber, MD  escitalopram  (LEXAPRO ) 20 MG tablet Take 20 mg by mouth every morning.     [provider]  levothyroxine  (SYNTHROID ) 75 MCG tablet Take 1 tablet (75 mcg total) by mouth daily before breakfast. 08/13/23   Dellar Fenton, MD  nortriptyline (PAMELOR) 25 MG capsule Take 25 mg by mouth 2 (two) times daily.    [provider]  olutasidenib  (REZLIDHIA ) 150 MG capsule Take 1 capsule (150 mg total) by mouth daily. Take on an empty stomach at least 1 hour before or 2 hours after a meal. 07/09/23   Leonard, Alyson N, RPH-CPP  pantoprazole  (PROTONIX ) 40 MG tablet Take 1 tablet (40 mg total) by mouth 2 (two) times daily before a meal. 03/25/23   Dellar Fenton, MD  potassium chloride  (KLOR-CON  M) 10 MEQ tablet TAKE ONE TABLET BY MOUTH TWICE DAILY 04/20/23   Dellar Fenton, MD  prochlorperazine  (COMPAZINE ) 10 MG tablet TAKE ONE TABLET BY MOUTH EVERY 6 HOURS AS NEEDED FOR NAUSEA / VOMITING 08/23/23   Leonard, Alyson N, RPH-CPP  rosuvastatin  (CRESTOR ) 5 MG tablet Take 1 tablet (5 mg total) by mouth daily. 08/13/23   Dellar Fenton, MD  valACYclovir  (VALTREX ) 500 MG tablet TAKE ONE TABLET BY MOUTH DAILY 08/10/23   Leonard, Alyson N, RPH-CPP     Family History  Problem Relation Age of Onset   Stroke Mother        51s   Alzheimer's disease Mother    Lung cancer Father    Prostate cancer Father    Cancer Father        Colon   Colon cancer Father    Breast cancer Sister        85's   Lung cancer Sister    Breast cancer Maternal Aunt     Social History   Socioeconomic History   Marital status: Single    Spouse name: Not on file   Number of children: 0   Years of education: Not on file   Highest education level: Not on  file  Occupational History   Occupation: Retired  Tobacco Use   Smoking status: Never   Smokeless tobacco: Never  Vaping Use   Vaping status: Never Used  Substance and Sexual Activity   Alcohol use: No    Alcohol/week: 0.0 standard drinks of alcohol   Drug use: No   Sexual activity: Not Currently  Other Topics Concern   Not on file  Social History Narrative   Lives alone and occasionally has friend over.   Social Drivers of Corporate investment banker Strain: Low Risk  (09/18/2022)   Overall Financial Resource Strain (CARDIA)    Difficulty of Paying Living Expenses: Not hard at all  Food Insecurity: No Food Insecurity (09/18/2022)   Hunger Vital Sign    Worried About Running Out of Food  in the Last Year: Never true    Ran Out of Food in the Last Year: Never true  Transportation Needs: No Transportation Needs (09/18/2022)   PRAPARE - Administrator, Civil Service (Medical): No    Lack of Transportation (Non-Medical): No  Physical Activity: Insufficiently Active (09/18/2022)   Exercise Vital Sign    Days of Exercise per Week: 1 day    Minutes of Exercise per Session: 10 min  Stress: Stress Concern Present (09/18/2022)   [employer] of Occupational Health - Occupational Stress Questionnaire    Feeling of Stress : To some extent  Social Connections: Moderately Isolated (09/18/2022)   Social Connection and Isolation Panel [NHANES]    Frequency of Communication with Friends and Family: More than three times a week    Frequency of Social Gatherings with Friends and Family: Twice a week    Attends Religious Services: 1 to 4 times per year    Active Member of [employer] or Organizations: No    Attends Banker Meetings: Never    Marital Status: Divorced     Review of Systems: A 12 point ROS discussed and pertinent positives are indicated in the HPI above.  All other systems are negative.    Vital Signs: BP (!) 145/71   Pulse 85   Temp 97.9 F (36.6 C)  (Oral)   Resp 17   Ht 5\' 4"  (1.626 m)   Wt 123 lb 9.6 oz (56.1 kg)   SpO2 100%   BMI 21.22 kg/m   Advance Care Plan: No documents on file  Physical Exam Vitals and nursing note reviewed.  Constitutional:      Appearance: Normal appearance.  HENT:     Mouth/Throat:     Mouth: Mucous membranes are moist.     Pharynx: Oropharynx is clear.  Cardiovascular:     Rate and Rhythm: Normal rate and regular rhythm.  Pulmonary:     Effort: Pulmonary effort is normal.     Breath sounds: Normal breath sounds.  Abdominal:     Palpations: Abdomen is soft.     Tenderness: There is no abdominal tenderness.  Musculoskeletal:     Right lower leg: No edema.     Left lower leg: No edema.  Skin:    General: Skin is warm and dry.  Neurological:     Mental Status: She is alert and oriented to person, place, and time. Mental status is at baseline.     Imaging: No results found.  Labs:  CBC: Recent Labs    05/31/23 1348 06/28/23 1357 07/27/23 1406 08/23/23 1259  WBC 4.1 4.6 3.9* 3.6*  HGB 13.1 11.6* 11.3* 11.5*  HCT 37.2 32.0* 31.8* 32.6*  PLT 131* 112* 128* 110*    COAGS: No results for input(s): "INR", "APTT" in the last 8760 hours.  BMP: Recent Labs    05/31/23 1348 06/28/23 1357 07/27/23 1406 08/10/23 1415 08/23/23 1259  NA 139 140 140 141 139  K 4.0 3.7 3.6 4.0 3.7  CL 105 102 103 104 104  CO2 27 26 26 28 28   GLUCOSE 142* 134* 79 103* 94  BUN 17 15 17 14 13   CALCIUM  8.9 9.2 8.8* 9.2 8.6*  CREATININE 0.93 1.06* 0.94 0.98 0.82  GFRNONAA >60 55* >60  --  >60    LIVER FUNCTION TESTS: Recent Labs    05/31/23 1348 06/28/23 1357 07/27/23 1406 08/23/23 1259  BILITOT 0.8 1.0 0.9 0.8  AST 18 20 12* 13*  ALT 15 11 21 7   ALKPHOS 54 55 58 57  PROT 6.6 6.7 6.4* 6.5  ALBUMIN 4.3 4.0 3.9 4.0    TUMOR MARKERS: No results for input(s): "AFPTM", "CEA", "CA199", "CHROMGRNA" in the last 8760 hours.  Assessment and Plan:  Debra Hale is a 73 y.o. female with  known AML since 2021 presenting to IR service for CT bone marrow biopsy. She has undergone multiple different chemotherapy regimens since, most currently Olutasidenib . Her most recent bone marrow biopsy was 03/23/22 which showed increased of her blast cell count from 4% to 15%. Given she has been on continued chemotherapy and concern from last biopsy, IR has been consulted for repeat bone marrow biopsy to assess response to treatment.  Risks and benefits of CT bone marrow biopsy was discussed with the patient and/or patient's family including, but not limited to bleeding, infection, damage to adjacent structures or low yield requiring additional tests.  All of the questions were answered and there is agreement to proceed.  Consent signed and in chart.   Thank you for allowing our service to participate in HEDAYA CHINERY 's care.  Electronically Signed: Nicolasa Barrett, PA-C   09/03/2023, 9:25 AM      I spent a total of    15 Minutes in face to face in clinical consultation, greater than 50% of which was counseling/coordinating care for CT bone marrow biospy

## 2023-09-02 ENCOUNTER — Other Ambulatory Visit (HOSPITAL_COMMUNITY): Payer: Self-pay

## 2023-09-02 ENCOUNTER — Other Ambulatory Visit: Payer: Self-pay

## 2023-09-02 NOTE — Progress Notes (Signed)
 Specialty Pharmacy Ongoing Clinical Assessment Note  Debra Hale is a 73 y.o. female who is being followed by the specialty pharmacy service for RxSp Oncology   Patient's specialty medication(s) reviewed today: Olutasidenib  (REZLIDHIA )   Missed doses in the last 4 weeks: 0   Patient/Caregiver did not have any additional questions or concerns.   Therapeutic benefit summary: Patient is achieving benefit   Adverse events/side effects summary: No adverse events/side effects   Patient's therapy is appropriate to: Continue    Goals Addressed             This Visit's Progress    Stabilization of disease   On track    Patient is on track. Patient will maintain adherence and adhere to provider and/or lab appointments.  Patient changed therapy          Follow up:  6 months  Malachi Screws Specialty Pharmacist

## 2023-09-02 NOTE — Progress Notes (Signed)
 Patient for IR Bone Marrow Biopsy on Friday 09/03/23, [facility] from [facility] called and spoke with the patient on the phone and gave pre-procedure instructions. Nils Baseman made the patient aware to be here at 8:30a, NPO after MN prior to procedure as well as driver post procedure/recovery/discharge. Nils Baseman stated that the patient said that she understood.  Called 08/24/23  I was unable to reach the patient and her phone wouldn't let you leave a vm.

## 2023-09-02 NOTE — Progress Notes (Signed)
 Specialty Pharmacy Refill Coordination Note  Debra Hale is a 73 y.o. female contacted today regarding refills of specialty medication(s) Olutasidenib  (REZLIDHIA )   Patient requested Delivery   Delivery date: 09/07/23   Verified address: [address]   [place] [address]   Medication will be filled on 09/06/23.

## 2023-09-03 ENCOUNTER — Encounter: Payer: Self-pay | Admitting: Radiology

## 2023-09-03 ENCOUNTER — Ambulatory Visit
Admission: RE | Admit: 2023-09-03 | Discharge: 2023-09-03 | Disposition: A | Discharge status: Home / Self Care | Source: Ambulatory Visit | Attending: Oncology | Admitting: Oncology

## 2023-09-03 ENCOUNTER — Other Ambulatory Visit: Payer: Self-pay

## 2023-09-03 VITALS — BP 121/69 | HR 82 | Temp 97.9°F | Resp 16 | Ht 64.0 in | Wt 123.6 lb

## 2023-09-03 DIAGNOSIS — C9200 Acute myeloblastic leukemia, not having achieved remission: Secondary | ICD-10-CM | POA: Insufficient documentation

## 2023-09-03 DIAGNOSIS — D539 Nutritional anemia, unspecified: Secondary | ICD-10-CM | POA: Diagnosis not present

## 2023-09-03 DIAGNOSIS — D7589 Other specified diseases of blood and blood-forming organs: Secondary | ICD-10-CM | POA: Diagnosis not present

## 2023-09-03 DIAGNOSIS — G20A1 Parkinson's disease without dyskinesia, without mention of fluctuations: Secondary | ICD-10-CM | POA: Diagnosis not present

## 2023-09-03 DIAGNOSIS — D696 Thrombocytopenia, unspecified: Secondary | ICD-10-CM | POA: Diagnosis not present

## 2023-09-03 HISTORY — PX: IR BONE MARROW BIOPSY & ASPIRATION: IMG5727

## 2023-09-03 LAB — CBC WITH DIFFERENTIAL/PLATELET
Abs Immature Granulocytes: 0.45 10*3/uL — ABNORMAL HIGH (ref 0.00–0.07)
Basophils Absolute: 0 10*3/uL (ref 0.0–0.1)
Basophils Relative: 1 %
Eosinophils Absolute: 0.1 10*3/uL (ref 0.0–0.5)
Eosinophils Relative: 3 %
HCT: 31.7 % — ABNORMAL LOW (ref 36.0–46.0)
Hemoglobin: 11 g/dL — ABNORMAL LOW (ref 12.0–15.0)
Immature Granulocytes: 10 %
Lymphocytes Relative: 17 %
Lymphs Abs: 0.8 10*3/uL (ref 0.7–4.0)
MCH: 37.2 pg — ABNORMAL HIGH (ref 26.0–34.0)
MCHC: 34.7 g/dL (ref 30.0–36.0)
MCV: 107.1 fL — ABNORMAL HIGH (ref 80.0–100.0)
Monocytes Absolute: 0.3 10*3/uL (ref 0.1–1.0)
Monocytes Relative: 7 %
Neutro Abs: 2.7 10*3/uL (ref 1.7–7.7)
Neutrophils Relative %: 62 %
Platelets: 103 10*3/uL — ABNORMAL LOW (ref 150–400)
RBC: 2.96 MIL/uL — ABNORMAL LOW (ref 3.87–5.11)
RDW: 15.8 % — ABNORMAL HIGH (ref 11.5–15.5)
Smear Review: NORMAL
WBC: 4.3 10*3/uL (ref 4.0–10.5)
nRBC: 0 % (ref 0.0–0.2)

## 2023-09-03 MED ORDER — HEPARIN SOD (PORK) LOCK FLUSH 100 UNIT/ML IV SOLN
INTRAVENOUS | Status: AC
Start: 2023-09-03 — End: ?
  Filled 2023-09-03: qty 5

## 2023-09-03 MED ORDER — FENTANYL CITRATE (PF) 100 MCG/2ML IJ SOLN
INTRAMUSCULAR | Status: AC
  Filled 2023-09-03: qty 2

## 2023-09-03 MED ORDER — SODIUM CHLORIDE 0.9 % IV SOLN: INTRAVENOUS | Status: DC

## 2023-09-03 MED ORDER — LIDOCAINE 1 % OPTIME INJ - NO CHARGE
10.0000 mL | Freq: Once | INTRAMUSCULAR | Status: AC
  Administered 2023-09-03: 10 mL via INTRADERMAL
  Filled 2023-09-03: qty 10

## 2023-09-03 MED ORDER — MIDAZOLAM HCL 2 MG/2ML IJ SOLN
INTRAMUSCULAR | Status: AC
  Filled 2023-09-03: qty 2

## 2023-09-03 MED ORDER — MIDAZOLAM HCL 2 MG/2ML IJ SOLN
INTRAMUSCULAR | Status: AC | PRN
Start: 2023-09-03 — End: 2023-09-03
  Administered 2023-09-03: 1 mg via INTRAVENOUS

## 2023-09-03 MED ORDER — FENTANYL CITRATE (PF) 100 MCG/2ML IJ SOLN
INTRAMUSCULAR | Status: AC | PRN
  Administered 2023-09-03: 50 ug via INTRAVENOUS

## 2023-09-03 NOTE — Procedures (Signed)
Interventional Radiology Procedure Note  Procedure: Fluoro guided aspirate and core biopsy of left posterior iliac bone Complications: None Recommendations: - Bedrest supine x 1 hrs - OTC's PRN  Pain - Follow biopsy results  Signed,  Dulcy Fanny. Earleen Newport, DO

## 2023-09-06 ENCOUNTER — Other Ambulatory Visit: Payer: Self-pay

## 2023-09-10 ENCOUNTER — Encounter (HOSPITAL_COMMUNITY): Payer: Self-pay | Admitting: Oncology

## 2023-09-13 ENCOUNTER — Encounter (HOSPITAL_COMMUNITY): Payer: Self-pay

## 2023-09-13 LAB — SURGICAL PATHOLOGY

## 2023-09-15 DIAGNOSIS — R490 Dysphonia: Secondary | ICD-10-CM | POA: Diagnosis not present

## 2023-09-20 ENCOUNTER — Ambulatory Visit: Payer: PPO

## 2023-09-21 ENCOUNTER — Encounter: Payer: Self-pay | Admitting: Oncology

## 2023-09-21 ENCOUNTER — Inpatient Hospital Stay: Attending: Oncology

## 2023-09-21 ENCOUNTER — Inpatient Hospital Stay (HOSPITAL_BASED_OUTPATIENT_CLINIC_OR_DEPARTMENT_OTHER): Admitting: Oncology

## 2023-09-21 VITALS — BP 110/64 | HR 88 | Temp 98.9°F | Resp 17 | Ht 64.0 in | Wt 123.0 lb

## 2023-09-21 DIAGNOSIS — G20A1 Parkinson's disease without dyskinesia, without mention of fluctuations: Secondary | ICD-10-CM | POA: Diagnosis not present

## 2023-09-21 DIAGNOSIS — Z1331 Encounter for screening for depression: Secondary | ICD-10-CM | POA: Diagnosis not present

## 2023-09-21 DIAGNOSIS — Z8042 Family history of malignant neoplasm of prostate: Secondary | ICD-10-CM | POA: Diagnosis not present

## 2023-09-21 DIAGNOSIS — C9200 Acute myeloblastic leukemia, not having achieved remission: Secondary | ICD-10-CM

## 2023-09-21 DIAGNOSIS — Z803 Family history of malignant neoplasm of breast: Secondary | ICD-10-CM | POA: Insufficient documentation

## 2023-09-21 DIAGNOSIS — Z9221 Personal history of antineoplastic chemotherapy: Secondary | ICD-10-CM | POA: Diagnosis not present

## 2023-09-21 DIAGNOSIS — Z85038 Personal history of other malignant neoplasm of large intestine: Secondary | ICD-10-CM | POA: Insufficient documentation

## 2023-09-21 DIAGNOSIS — D696 Thrombocytopenia, unspecified: Secondary | ICD-10-CM | POA: Insufficient documentation

## 2023-09-21 DIAGNOSIS — R258 Other abnormal involuntary movements: Secondary | ICD-10-CM | POA: Diagnosis not present

## 2023-09-21 DIAGNOSIS — Z800 Family history of malignant neoplasm of digestive organs: Secondary | ICD-10-CM | POA: Insufficient documentation

## 2023-09-21 DIAGNOSIS — R1310 Dysphagia, unspecified: Secondary | ICD-10-CM | POA: Diagnosis not present

## 2023-09-21 DIAGNOSIS — R2689 Other abnormalities of gait and mobility: Secondary | ICD-10-CM | POA: Diagnosis not present

## 2023-09-21 DIAGNOSIS — Z853 Personal history of malignant neoplasm of breast: Secondary | ICD-10-CM | POA: Diagnosis not present

## 2023-09-21 DIAGNOSIS — Z801 Family history of malignant neoplasm of trachea, bronchus and lung: Secondary | ICD-10-CM | POA: Diagnosis not present

## 2023-09-21 DIAGNOSIS — Z79899 Other long term (current) drug therapy: Secondary | ICD-10-CM | POA: Insufficient documentation

## 2023-09-21 DIAGNOSIS — R531 Weakness: Secondary | ICD-10-CM | POA: Diagnosis not present

## 2023-09-21 DIAGNOSIS — G5603 Carpal tunnel syndrome, bilateral upper limbs: Secondary | ICD-10-CM | POA: Diagnosis not present

## 2023-09-21 DIAGNOSIS — R490 Dysphonia: Secondary | ICD-10-CM | POA: Diagnosis not present

## 2023-09-21 LAB — CBC WITH DIFFERENTIAL/PLATELET
Abs Immature Granulocytes: 0.29 10*3/uL — ABNORMAL HIGH (ref 0.00–0.07)
Basophils Absolute: 0 10*3/uL (ref 0.0–0.1)
Basophils Relative: 0 %
Eosinophils Absolute: 0.1 10*3/uL (ref 0.0–0.5)
Eosinophils Relative: 3 %
HCT: 30.7 % — ABNORMAL LOW (ref 36.0–46.0)
Hemoglobin: 11 g/dL — ABNORMAL LOW (ref 12.0–15.0)
Immature Granulocytes: 8 %
Lymphocytes Relative: 19 %
Lymphs Abs: 0.7 10*3/uL (ref 0.7–4.0)
MCH: 37 pg — ABNORMAL HIGH (ref 26.0–34.0)
MCHC: 35.8 g/dL (ref 30.0–36.0)
MCV: 103.4 fL — ABNORMAL HIGH (ref 80.0–100.0)
Monocytes Absolute: 0.2 10*3/uL (ref 0.1–1.0)
Monocytes Relative: 6 %
Neutro Abs: 2.4 10*3/uL (ref 1.7–7.7)
Neutrophils Relative %: 64 %
Platelets: 125 10*3/uL — ABNORMAL LOW (ref 150–400)
RBC: 2.97 MIL/uL — ABNORMAL LOW (ref 3.87–5.11)
RDW: 15 % (ref 11.5–15.5)
Smear Review: NORMAL
WBC: 3.7 10*3/uL — ABNORMAL LOW (ref 4.0–10.5)
nRBC: 0 % (ref 0.0–0.2)

## 2023-09-21 LAB — COMPREHENSIVE METABOLIC PANEL WITH GFR
ALT: 9 U/L (ref 0–44)
AST: 13 U/L — ABNORMAL LOW (ref 15–41)
Albumin: 3.9 g/dL (ref 3.5–5.0)
Alkaline Phosphatase: 63 U/L (ref 38–126)
Anion gap: 8 (ref 5–15)
BUN: 16 mg/dL (ref 8–23)
CO2: 26 mmol/L (ref 22–32)
Calcium: 8.6 mg/dL — ABNORMAL LOW (ref 8.9–10.3)
Chloride: 103 mmol/L (ref 98–111)
Creatinine, Ser: 1.04 mg/dL — ABNORMAL HIGH (ref 0.44–1.00)
GFR, Estimated: 57 mL/min — ABNORMAL LOW (ref >60)
Glucose, Bld: 134 mg/dL — ABNORMAL HIGH (ref 70–99)
Potassium: 4.2 mmol/L (ref 3.5–5.1)
Sodium: 137 mmol/L (ref 135–145)
Total Bilirubin: 1.1 mg/dL (ref 0.0–1.2)
Total Protein: 6.7 g/dL (ref 6.5–8.1)

## 2023-09-21 NOTE — Progress Notes (Signed)
 Patient here for oncology follow-up appointment, expresses concerns of worsening tremors

## 2023-09-21 NOTE — Progress Notes (Signed)
 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  She reports worsening tremor secondary to her Parkinson's, but otherwise feels well.  She continues to take and tolerate dose reduced Rezlidhia . She denies any pain.  She has no other neurologic complaints.  She denies any recent fevers or illnesses.  She has a fair appetite, but denies weight loss.  She has no chest pain, shortness of breath, cough, or hemoptysis.  She denies any nausea, vomiting, constipation, or diarrhea.  Patient offers no further specific complaints today.  REVIEW OF SYSTEMS:   Review of Systems  Constitutional: Negative.  Negative for fever, malaise/fatigue and weight loss.  HENT:  Negative for congestion and sinus pain.   Respiratory: Negative.  Negative for cough, hemoptysis and shortness of breath.   Cardiovascular: Negative.  Negative for chest pain and leg swelling.  Gastrointestinal: Negative.  Negative for abdominal pain, diarrhea and nausea.  Genitourinary:  Negative for dysuria, frequency and urgency.  Musculoskeletal: Negative.  Negative for back pain and falls.  Skin: Negative.  Negative for rash.  Neurological:  Positive for tremors. Negative for dizziness, speech change, focal weakness, weakness and headaches.  Psychiatric/Behavioral: Negative.  Negative for memory loss. The patient is not nervous/anxious.     As per HPI. Otherwise, a complete review of systems is negative.  PAST  MEDICAL HISTORY: Past Medical History:  Diagnosis Date   Abdominal pain 03/06/2016   Acute pericarditis 05/31/2013   Anxiety    Arthritis    Osteoarthritis   Breast cancer (HCC) 2009   right breast lumpectomy with rad tx   Colon cancer (HCC)    surgery with chemo and rad tx   Complication of anesthesia    GERD (gastroesophageal reflux disease)    Heart palpitations    History of hiatal hernia    History of kidney stones    History of thyroid  cancer 09/18/2008   Qualifier: Diagnosis of   By: Andrena Bang, CMA, Carol       Hypothyroidism    Liver disease    Liver nodule    s/p negative biopsy   Malignant neoplasm of thyroid  gland (HCC) 2002   s/p surgery and XRT   Osteoporosis    Other and unspecified hyperlipidemia    Palpitations    Personal history of chemotherapy    Personal history of malignant neoplasm of large intestine    carcinoma - cecum, s/p right laparoscopic colectomy - s/p chemotherapy and XRT   Personal history of radiation therapy    Pneumonia 2019   PONV (postoperative nausea and vomiting)    Pure hypercholesterolemia    Unspecified hereditary and idiopathic peripheral neuropathy     PAST SURGICAL HISTORY: Past Surgical History:  Procedure Laterality Date   APPENDECTOMY  1985   BREAST BIOPSY Right 2009   positive   BREAST BIOPSY Right 2009   negative   BREAST LUMPECTOMY Right 2009   breast cancer   CHOLECYSTECTOMY  1995   COLONOSCOPY     COLONOSCOPY WITH PROPOFOL  N/A 10/29/2017   Procedure: COLONOSCOPY WITH  PROPOFOL ;  Surgeon: Cassie Click, MD;  Location: [facility] ENDOSCOPY;  Service: Endoscopy;  Laterality: N/A;   DILATION AND CURETTAGE OF UTERUS  1990   DILATION AND CURETTAGE, DIAGNOSTIC / THERAPEUTIC  1990   ESOPHAGOGASTRODUODENOSCOPY     ESOPHAGOGASTRODUODENOSCOPY (EGD) WITH PROPOFOL  N/A 10/29/2017   Procedure: ESOPHAGOGASTRODUODENOSCOPY (EGD) WITH PROPOFOL ;  Surgeon: Cassie Click, MD;  Location: [facility] ENDOSCOPY;  Service: Endoscopy;  Laterality:  N/A;   INCISION AND DRAINAGE PERIRECTAL ABSCESS N/A 06/17/2020   Procedure: IRRIGATION AND DEBRIDEMENT PERIRECTAL ABSCESS;  Surgeon: Alben Alma, MD;  Location: [facility] ORS;  Service: General;  Laterality: N/A;   IR BONE MARROW BIOPSY & ASPIRATION  07/09/2021   IR BONE MARROW BIOPSY & ASPIRATION  09/03/2023   LAPAROSCOPIC PARTIAL COLECTOMY     stage 3-C carcinoma of the cecum, s/p chemotherapy and xrt   LITHOTRIPSY     SIGMOIDOSCOPY  08/26/1993   THYROID  LOBECTOMY  2002   s/p XRT   TOTAL HIP ARTHROPLASTY Right 10/24/2019   Procedure: TOTAL HIP ARTHROPLASTY;  Surgeon: Elner Hahn, MD;  Location: [facility] ORS;  Service: Orthopedics;  Laterality: Right;    FAMILY HISTORY: Family History  Problem Relation Age of Onset   Stroke Mother        55s   Alzheimer's disease Mother    Lung cancer Father    Prostate cancer Father    Cancer Father        Colon   Colon cancer Father    Breast cancer Sister        52's   Lung cancer Sister    Breast cancer Maternal Aunt     ADVANCED DIRECTIVES (Y/N):  N  HEALTH MAINTENANCE: Social History   Tobacco Use   Smoking status: Never   Smokeless tobacco: Never  Vaping Use   Vaping status: Never Used  Substance Use Topics   Alcohol use: No    Alcohol/week: 0.0 standard drinks of alcohol   Drug use: No     Colonoscopy:  PAP:  Bone density:  Lipid panel:  Allergies  Allergen Reactions   Demeclocycline Other (See Comments)    Throat swells   Sulfa  Antibiotics Other (See Comments)    Other reaction(s): Other (See Comments) Throat swells Other reaction(s): Other (See Comments) Throat swells Throat swells Other reaction(s): Other (See Comments) Throat swells Other reaction(s): Unknown Other reaction(s): Other (See Comments) Throat swells Other reaction(s): Other (See Comments) Other reaction(s): Other (See Comments) Throat swellsOther reaction(s): Other (See Comments) Throat swells   Tetracyclines & Related Other (See Comments)     Throat swells   Chlordiazepoxide    Dicyclomine    Levofloxacin  Diarrhea    Severe diarrhea   Augmentin  [Amoxicillin -Pot Clavulanate] Diarrhea   Bentyl [Dicyclomine Hcl]     unkn   Ciprofloxacin  Diarrhea   Codeine Other (See Comments)    dizziness     Dicyclomine Hcl Other (See Comments)    unkn   Epinephrine      Speeds heart up - panic    Flagyl  [Metronidazole ] Nausea And Vomiting   Librax [Chlordiazepoxide-Clidinium]     unkn   Novocain [Procaine] Other (See Comments)    "Shaky"   Phenobarbital     unkn   Prednisone      Nervous     Tramadol Other (See Comments)    Sick feeling    Current Outpatient Medications  Medication Sig Dispense Refill   Azelastine -Fluticasone  137-50 MCG/ACT SUSP SPRAY 2 SPRAYS INTO EACH NOSTRIL EVERY DAY 23 g  2   Carbidopa-Levodopa ER (SINEMET CR) 25-100 MG tablet controlled release Take 25/100 mg CR tablet nightly     clonazePAM  (KLONOPIN ) 0.5 MG tablet Take 1 tablet (0.5 mg total) by mouth 3 (three) times daily as needed for anxiety. 30 tablet 0   escitalopram  (LEXAPRO ) 20 MG tablet Take 20 mg by mouth every morning.      levothyroxine  (SYNTHROID ) 75 MCG tablet Take 1 tablet (75 mcg total) by mouth daily before breakfast. 30 tablet 3   nortriptyline (PAMELOR) 25 MG capsule Take 25 mg by mouth 2 (two) times daily.     olutasidenib  (REZLIDHIA ) 150 MG capsule Take 1 capsule (150 mg total) by mouth daily. Take on an empty stomach at least 1 hour before or 2 hours after a meal. 30 capsule 2   pantoprazole  (PROTONIX ) 40 MG tablet Take 1 tablet (40 mg total) by mouth 2 (two) times daily before a meal. 180 tablet 1   potassium chloride  (KLOR-CON  M) 10 MEQ tablet TAKE ONE TABLET BY MOUTH TWICE DAILY 60 tablet 2   prochlorperazine  (COMPAZINE ) 10 MG tablet TAKE ONE TABLET BY MOUTH EVERY 6 HOURS AS NEEDED FOR NAUSEA / VOMITING 30 tablet 3   rosuvastatin  (CRESTOR ) 5 MG tablet Take 1 tablet (5 mg total) by mouth daily. 30 tablet 2   valACYclovir  (VALTREX ) 500  MG tablet TAKE ONE TABLET BY MOUTH DAILY 30 tablet 2   No current facility-administered medications for this visit.    OBJECTIVE: Vitals:   09/21/23 1438  BP: 110/64  Pulse: 88  Resp: 17  Temp: 98.9 F (37.2 C)  SpO2: 100%     Body mass index is 21.11 kg/m.    ECOG FS:0 - Asymptomatic  General: Well-developed, well-nourished, no acute distress. Eyes: Pink conjunctiva, anicteric sclera. HEENT: Normocephalic, moist mucous membranes. Lungs: No audible wheezing or coughing. Heart: Regular rate and rhythm. Abdomen: Soft, nontender, no obvious distention. Musculoskeletal: No edema, cyanosis, or clubbing. Neuro: Alert, answering all questions appropriately. Cranial nerves grossly intact. Skin: No rashes or petechiae noted. Psych: Normal affect.  LAB RESULTS:  Lab Results  Component Value Date   NA 137 09/21/2023   K 4.2 09/21/2023   CL 103 09/21/2023   CO2 26 09/21/2023   GLUCOSE 134 (H) 09/21/2023   BUN 16 09/21/2023   CREATININE 1.04 (H) 09/21/2023   CALCIUM  8.6 (L) 09/21/2023   PROT 6.7 09/21/2023   ALBUMIN 3.9 09/21/2023   AST 13 (L) 09/21/2023   ALT 9 09/21/2023   ALKPHOS 63 09/21/2023   BILITOT 1.1 09/21/2023   GFRNONAA 57 (L) 09/21/2023   GFRAA 54 (L) 10/26/2019    Lab Results  Component Value Date   WBC 3.7 (L) 09/21/2023   NEUTROABS 2.4 09/21/2023   HGB 11.0 (L) 09/21/2023   HCT 30.7 (L) 09/21/2023   MCV 103.4 (H) 09/21/2023   PLT 125 (L) 09/21/2023     STUDIES: IR BONE MARROW BIOPSY & ASPIRATION Result Date: 09/03/2023 INDICATION: 73 year old female with follow-up AML, referred for bone marrow biopsy EXAM: IR BONE MARRO BIOPY AND ASPIRATION MEDICATIONS: None. ANESTHESIA/SEDATION: Moderate (conscious) sedation was employed during this procedure. A total of Versed  1 mg and Fentanyl  50 mcg was administered intravenously. Moderate Sedation Time: 10 minutes. The patient's level of consciousness and vital signs were monitored continuously by radiology  nursing throughout the procedure under my direct supervision. FLUOROSCOPY TIME:  Fluoroscopy Time:  (1 mGy). COMPLICATIONS: None immediate. PROCEDURE: Informed written consent was obtained from the patient after a thorough discussion  of the procedural risks, benefits and alternatives. All questions were addressed. Maximal Sterile Barrier Technique was utilized including caps, mask, sterile gowns, sterile gloves, sterile drape, hand hygiene and skin antiseptic. A timeout was performed prior to the initiation of the procedure. The procedure risks, benefits, and alternatives were explained to the patient. Questions regarding the procedure were encouraged and answered. The patient understands and consents to the procedure. Patient was positioned prone under the image intensifier. Physical exam was used to determine the L5- S1 level, and then the posterior pelvis was prepped with Chlorhexidine  in a sterile fashion. A sterile drape was applied covering the operative field. Sterile gown/sterile gloves were used for the procedure. Local anesthesia was provided with 1% Lidocaine . Posterior left iliac bone was targeted for biopsy. The skin and subcutaneous tissues were infiltrated with 1% lidocaine  without epinephrine . A small stab incision was made with an 11 blade scalpel, and an 11 gauge Murphy needle was advanced with fluoro guidance to the posterior cortex. Manual forced was used to advance the needle through the posterior cortex and the stylet was removed. A bone marrow aspirate was retrieved and passed to a cytotechnologist in the room. The Murphy needle was then advanced without the stylet for a core biopsy. The core biopsy was retrieved and also passed to a cytotechnologist. Manual pressure was used for hemostasis and a sterile dressing was placed. No complications were encountered no significant blood loss was encountered. Patient tolerated the procedure well and remained hemodynamically stable throughout.  IMPRESSION: Status post image guided bone marrow biopsy. Signed, Marciano Settles. Rexine Cater, RPVI [facility] Electronically Signed   By: Myrlene Asper D.O.   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  PLAN:    AML: Confirmed by bone marrow biopsy.  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  These mutations were not reported on her most recent cytogenetics report.  Patient's most recent bone marrow biopsy on Sep 03, 2023 revealed no morphologic evidence of AML.  Prior to this, patient noted to have a 15% blast count in November 2023.  She last received treatment with cycle 22 of Vidaza  on February 13, 2022.  Given her progressive disease and IDH-1 mutation, Vidaza  was discontinued and patient initially received 150 mg twice daily of Olutasidenib  (Rezlidhia ).  This has been now dose reduce to once per day secondary to nausea and thrombocytopenia.  Continue treatment until intolerable side effects or progression of disease.  Return to clinic in 2 months with laboratory work and evaluation by clinical pharmacy.  Patient will then return to clinic in 4 months for further evaluation and continuation of treatment. Thrombocytopenia: Chronic and unchanged.  Patient's platelet count is 125 today.  Continue dose reduced treatment once per day as above. Leukopenia: Mild, monitor.  Patient's total white blood cell count is 3.7. Anemia: Chronic and unchanged.  Patient's hemoglobin is 11.0. Diarrhea: Resolved.  Continue Lomotil  as needed.   Nausea: Patient does not complain of this today.   Parkinson's: Continue treatment and management per neurology. Patient now on amitriptyline and carbidopa/levodopa.  She reports that she had an appointment with them this morning. 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  Patient also received chemotherapy and likely received Adriamycin, but exact regimen is unknown.  She completed 5 years of hormonal therapy in approximately June 2015. Currently, she has no evidence of disease.  Her most recent mammogram on July 29, 2021 was reported as BI-RADS 2.   History of colon cancer: Patient also states that she received chemotherapy for this, possibly FOLFOX but again this is unknown. Hair thinning: Improving.  Likely related to thyroid  dosing.   Patient expressed understanding and was in agreement with this plan. She also understands that She can call clinic at any time with any questions, concerns, or complaints.    Cancer Staging  History of breast cancer Staging form: Breast, AJCC 7th Edition - Clinical stage from 01/07/2016: Stage IA (T1c, N0, M0) - Signed by Shellie Dials, MD on 01/07/2016 Laterality: Right Estrogen receptor status: Positive Progesterone receptor status: Positive HER2 status: Negative   Shellie Dials, MD   09/21/2023 3:06 PM

## 2023-09-22 ENCOUNTER — Ambulatory Visit: Attending: Unknown Physician Specialty

## 2023-09-22 DIAGNOSIS — R490 Dysphonia: Secondary | ICD-10-CM | POA: Diagnosis not present

## 2023-09-22 NOTE — Therapy (Signed)
 OUTPATIENT SPEECH LANGUAGE PATHOLOGY  VOICE EVALUATION   Patient Name: Debra Hale MRN: [id number] DOB:[birth year], 73 y.o., female Today's Date: 09/22/2023  PCP: Dellar Fenton, MD REFERRING PROVIDER: Lesly Raspberry, MD   End of Session - 09/22/23 1304     Visit Number 1    Number of Visits 12    Date for SLP Re-Evaluation 12/15/23    SLP Start Time 1151    SLP Stop Time  1237    SLP Time Calculation (min) 46 min    Activity Tolerance Patient tolerated treatment well             Past Medical History:  Diagnosis Date   Abdominal pain 03/06/2016   Acute pericarditis 05/31/2013   Anxiety    Arthritis    Osteoarthritis   Breast cancer (HCC) 2009   right breast lumpectomy with rad tx   Colon cancer (HCC)    surgery with chemo and rad tx   Complication of anesthesia    GERD (gastroesophageal reflux disease)    Heart palpitations    History of hiatal hernia    History of kidney stones    History of thyroid  cancer 09/18/2008   Qualifier: Diagnosis of   By: Andrena Bang, CMA, Carol       Hypothyroidism    Liver disease    Liver nodule    s/p negative biopsy   Malignant neoplasm of thyroid  gland (HCC) 2002   s/p surgery and XRT   Osteoporosis    Other and unspecified hyperlipidemia    Palpitations    Personal history of chemotherapy    Personal history of malignant neoplasm of large intestine    carcinoma - cecum, s/p right laparoscopic colectomy - s/p chemotherapy and XRT   Personal history of radiation therapy    Pneumonia 2019   PONV (postoperative nausea and vomiting)    Pure hypercholesterolemia    Unspecified hereditary and idiopathic peripheral neuropathy    Past Surgical History:  Procedure Laterality Date   APPENDECTOMY  1985   BREAST BIOPSY Right 2009   positive   BREAST BIOPSY Right 2009   negative   BREAST LUMPECTOMY Right 2009   breast cancer   CHOLECYSTECTOMY  1995   COLONOSCOPY     COLONOSCOPY WITH PROPOFOL  N/A 10/29/2017   Procedure:  COLONOSCOPY WITH PROPOFOL ;  Surgeon: Cassie Click, MD;  Location: [facility] ENDOSCOPY;  Service: Endoscopy;  Laterality: N/A;   DILATION AND CURETTAGE OF UTERUS  1990   DILATION AND CURETTAGE, DIAGNOSTIC / THERAPEUTIC  1990   ESOPHAGOGASTRODUODENOSCOPY     ESOPHAGOGASTRODUODENOSCOPY (EGD) WITH PROPOFOL  N/A 10/29/2017   Procedure: ESOPHAGOGASTRODUODENOSCOPY (EGD) WITH PROPOFOL ;  Surgeon: Cassie Click, MD;  Location: [facility] ENDOSCOPY;  Service: Endoscopy;  Laterality: N/A;   INCISION AND DRAINAGE PERIRECTAL ABSCESS N/A 06/17/2020   Procedure: IRRIGATION AND DEBRIDEMENT PERIRECTAL ABSCESS;  Surgeon: Alben Alma, MD;  Location: [facility] ORS;  Service: General;  Laterality: N/A;   IR BONE MARROW BIOPSY & ASPIRATION  07/09/2021   IR BONE MARROW BIOPSY & ASPIRATION  09/03/2023   LAPAROSCOPIC PARTIAL COLECTOMY     stage 3-C carcinoma of the cecum, s/p chemotherapy and xrt   LITHOTRIPSY     SIGMOIDOSCOPY  08/26/1993   THYROID  LOBECTOMY  2002   s/p XRT   TOTAL HIP ARTHROPLASTY Right 10/24/2019   Procedure: TOTAL HIP ARTHROPLASTY;  Surgeon: Elner Hahn, MD;  Location: [facility] ORS;  Service: Orthopedics;  Laterality: Right;   Patient Active Problem List   Diagnosis Date  Noted   Hypomagnesemia 08/10/2023   Hoarseness 03/28/2023   Tremor 02/14/2023   Depression with anxiety 04/04/2022   Hypokalemia 04/04/2022   Diarrhea 04/04/2022   Chronic diastolic CHF (congestive heart failure) (HCC) 04/04/2022   Hypophosphatemia 04/04/2022   Electrolyte abnormality 03/31/2022   Hydronephrosis of right kidney 03/31/2022   Vaginal dryness 01/05/2022   Liver nodule 11/18/2021   Major depressive disorder, single episode, mild (HCC) 06/01/2021   Fatty liver disease, nonalcoholic 04/18/2020   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   Pancytopenia (HCC) 02/11/2020   Status post total hip replacement, 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   Lumbar spondylosis 08/25/2019   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/10/2019    Leukopenia 08/21/2018   Change in vision 06/24/2018   Chronic daily headache 05/31/2018   Hypothyroidism 05/02/2018   Vitamin D  deficiency 07/27/2015   Loose stools 04/14/2015   Health care maintenance 06/17/2014   Flatulence, eructation and gas pain 06/17/2014   Stress 01/28/2014   Abnormal respiratory rate 06/19/2013   Environmental allergies 02/25/2013   Abnormal liver function test 12/07/2012   Anxiety 05/06/2012   GERD (gastroesophageal reflux disease) 05/06/2012   Osteopenia 05/06/2012   Hematuria 05/06/2012   Osteoporosis 05/06/2012   History of breast cancer 09/18/2008   History of thyroid  cancer 09/18/2008   Hypercholesterolemia 09/18/2008   Chemotherapy-induced neuropathy (HCC) 09/18/2008   Palpitations 09/18/2008   History of malignant neoplasm of large intestine 09/18/2008   Malignant neoplasm of breast (female) (HCC) 09/18/2008   Malignant neoplasm of thyroid  gland (HCC) 09/18/2008   Hereditary and idiopathic peripheral neuropathy 09/18/2008    ONSET DATE:  ~3 months ago  REFERRING DIAG: Dysphonia, Hoarseness  THERAPY DIAG:  Dysphonia  Rationale for Evaluation and Treatment Rehabilitation  SUBJECTIVE:   SUBJECTIVE STATEMENT: Pt alert and cooperative. Pt accompanied by: self  PERTINENT HISTORY: Reports worsening hoarseness for ~3 months  DIAGNOSTIC FINDINGS: Laryngoscopy 09/2022 "mild bowing of vocal folds"  PAIN:  Are you having pain? No   FALLS: Has patient fallen in last 6 months? No, Number of falls: 0  LIVING ENVIRONMENT: Lives with: lives alone Lives in: House/apartment  PLOF: Independent  PATIENT GOALS    for voice to improve  OBJECTIVE:  COGNITION: Overall cognitive status: Within functional limits for tasks assessed  SOCIAL HISTORY: Occupation: retired; Radio producer intake: suboptimal Caffeine/alcohol intake: none Daily voice use: minimal Environmental risks: Other: Occupational risks: None identified Misuse: Glottal fry,  Strain, Tension, Speaks without adequate breath support, and Speaks on residual capacity Phonotraumatic behaviors: Excessive and/or habitual throat clearing  PERCEPTUAL VOICE ASSESSMENT: Voice quality: hoarse, rough, and strained; c/o vocal fatigue Vocal abuse: habitual throat clearing and abnormal breathing pattern Resonance: normal Respiratory function: thoracic breathing and clavicular breathing; speaking on residual capacity  OBJECTIVE VOICE ASSESSMENT: Sustained "ah" maximum phonation time: 4.6 seconds Sustained "ah" loudness average: 83 dB Average fundamental frequency during sustained "ah":278Hz    (1 SD above average of  244 Hz +/- 27 for gender)  Oral reading (passage) loudness average: 82 dB Oral reading loudness range: 62-89 dB Conversational pitch average: 274 Hz Highest dynamic pitch in conversational speech: 96 Hz Lowest dynamic pitch in conversational speech: 370 Hz Conversational loudness range: 69-90 dB Conversational loudness average: 82 dB Voice quality: hoarse, rough, and strained Stimulability trials: Given SLP modeling and consistent mod cues, subjective report in vocal quality noted while using clear speech therapy. Limited appreciable change with trials for flow phonation and resonant voice.    ORAL MOTOR EXAMINATION WFL   PATIENT REPORTED OUTCOME  MEASURES (PROM):  VOICE HANDICAP INDEX (VHI)  The Voice Handicap Index is comprised of a series of questions to assess the patient's perception of their voice. It is designed to evaluate the emotional, physical and functional components of the voice problem.  Functional: 22 Physical: 21 Emotional: 26 Total: 64/120  *Severe    TODAY'S TREATMENT:  Pt educated re: role of SLP, results of assessment, rationale for voice therapy, therapeutic approaches, vocal resonance, and clear speech therapy.    PATIENT EDUCATION: Education details: as above Person educated: Patient Education method:  Explanation Education comprehension: verbalized understanding, returned demonstration, and needs further education   HOME EXERCISE PROGRAM: Practice clear speech therapy     GOALS: Goals reviewed with patient? Yes  SHORT TERM GOALS: Target date: 10 sessions  Pt willverbalize awareness of phonotraumatic behaviors, such as chronic throat clearing, and verbalize/demo non-phonotraumatic alternative to throat clearing. Baseline: Goal status: INITIAL  2.  Pt will be independent in HEP for voice. Baseline:  Goal status: INITIAL  3.  Pt will participate in 5-8 minutes conversation, maintaining average loudness of 75 dB and loud, good quality voice with min cues.     Baseline:  Goal status: INITIAL  4. Pt will demonstrate independent understanding of vocal hygiene concepts.   Baseline:  Goal status: INITIAL    LONG TERM GOALS: Target date: 12 weeks  Pt will improve score on the VHI by 10% indicating improved perception of speech abilities.  Baseline:  Goal status: INITIAL  2.  The patient will participate in 15-20 minutes conversation, maintaining average loudness of 75 dB and loud, good quality voice.    Baseline: 64/120 Goal status: INITIAL   ASSESSMENT:  CLINICAL IMPRESSION: Patient is a 73 y.o. female who was seen today for voice evaluation due to complaints of hoarse, raspy, and weak vocal quality. Pt with c/o vocal fatigue stating, "my voice gives out at the end of the day." Pt currently undergoing chemotx for AML and is reported suboptimal water intake. Pt with hx of Parkinson's Disease, GERD, anxiety, thyroid  cancer, colon cancer, and breast cancer. Based on VHI, pt's dysphonia is severe and with impacts on daily and social communication. Perceptual features of dysphonia observed today include hoarseness, strain, and fry. Pt with tendency to utilize clavicular and thoracic breathing. When cued to use clear speech therapy, notable subjective and auditory-perceptual  improvement in hoarseness observed. Pt with habitual throat clearing observed. Flexible laryngoscopic evaluation completed 09/15/23 was unremarkable except "mild bowing of vocal folds." Recommend course of ST for dysphonia to improve pt's QoL.   OBJECTIVE IMPAIRMENTS include voice disorder. These impairments are limiting patient from effectively communicating at home and in community. Factors affecting potential to achieve goals and functional outcome are co-morbidities. Patient will benefit from skilled SLP services to address above impairments and improve overall function.  REHAB POTENTIAL: Good  PLAN: SLP FREQUENCY: 1x/week  SLP DURATION: 12 weeks  PLANNED INTERVENTIONS: Environmental controls, Cueing hierachy, Functional tasks, SLP instruction and feedback, Compensatory strategies, and Patient/family education   Dia Forget, M.S., CCC-SLP Speech-Language Pathologist [place] - [facility] [phone] [place])   [facility] Outpatient Rehabilitation at [facility] [address] [place], [place], [address] Phone: [phone]   Fax:  [phone]

## 2023-09-23 ENCOUNTER — Other Ambulatory Visit

## 2023-09-23 ENCOUNTER — Other Ambulatory Visit: Payer: Self-pay

## 2023-09-23 ENCOUNTER — Other Ambulatory Visit (INDEPENDENT_AMBULATORY_CARE_PROVIDER_SITE_OTHER)

## 2023-09-23 ENCOUNTER — Other Ambulatory Visit (HOSPITAL_COMMUNITY): Payer: Self-pay

## 2023-09-23 DIAGNOSIS — Z79899 Other long term (current) drug therapy: Secondary | ICD-10-CM

## 2023-09-23 DIAGNOSIS — E039 Hypothyroidism, unspecified: Secondary | ICD-10-CM

## 2023-09-23 DIAGNOSIS — F3481 Disruptive mood dysregulation disorder: Secondary | ICD-10-CM

## 2023-09-23 NOTE — Progress Notes (Signed)
 Nortriptyline level ordered for Dr Lavine

## 2023-09-23 NOTE — Addendum Note (Signed)
 Addended by: Victorino Grates D on: 09/23/2023 03:36 PM   Modules accepted: Orders

## 2023-09-23 NOTE — Addendum Note (Signed)
 Addended by: Victorino Grates D on: 09/23/2023 03:38 PM   Modules accepted: Orders

## 2023-09-24 LAB — TSH: TSH: 0.32 u[IU]/mL — ABNORMAL LOW (ref 0.35–5.50)

## 2023-09-28 ENCOUNTER — Other Ambulatory Visit: Payer: Self-pay | Admitting: Pharmacy Technician

## 2023-09-28 ENCOUNTER — Other Ambulatory Visit: Payer: Self-pay | Admitting: Pharmacist

## 2023-09-28 ENCOUNTER — Other Ambulatory Visit: Payer: Self-pay

## 2023-09-28 ENCOUNTER — Ambulatory Visit

## 2023-09-28 DIAGNOSIS — C9200 Acute myeloblastic leukemia, not having achieved remission: Secondary | ICD-10-CM

## 2023-09-28 MED ORDER — OLUTASIDENIB 150 MG PO CAPS
150.0000 mg | ORAL_CAPSULE | Freq: Every day | ORAL | 2 refills | Status: DC
  Filled 2023-09-28: qty 30, 30d supply, fill #0
  Filled 2023-10-27: qty 30, 30d supply, fill #1
  Filled 2023-11-29 – 2023-12-01 (×2): qty 30, 30d supply, fill #2

## 2023-09-28 NOTE — Progress Notes (Signed)
 Specialty Pharmacy Refill Coordination Note  Jaquita Merl Word is a 73 y.o. female contacted today regarding refills of specialty medication(s) Olutasidenib  (REZLIDHIA )   Patient requested Delivery   Delivery date: 10/04/23   Verified address: [address] [place], [place] [address]   Medication will be filled on 10/01/23.   This fill date is pending response to refill request from provider. Patient is aware and if they have not received fill by intended date they must follow up with pharmacy.

## 2023-09-30 LAB — NORTRIPTYLINE (AVENTYL), SERUM: Nortriptyline (Aventyl), Serum: 89 ng/mL (ref 50–150)

## 2023-10-01 ENCOUNTER — Other Ambulatory Visit: Payer: Self-pay

## 2023-10-01 NOTE — Progress Notes (Signed)
 After multiple attempts, we have been unable to reach this patient to enroll in services.  Last attempt made on 09/30/2023.  [facility] remains available should the patient express interest in the future.

## 2023-10-03 ENCOUNTER — Ambulatory Visit: Payer: Self-pay | Admitting: Internal Medicine

## 2023-10-04 ENCOUNTER — Ambulatory Visit: Attending: Unknown Physician Specialty

## 2023-10-04 DIAGNOSIS — R490 Dysphonia: Secondary | ICD-10-CM | POA: Diagnosis not present

## 2023-10-04 NOTE — Therapy (Signed)
 OUTPATIENT SPEECH LANGUAGE PATHOLOGY  VOICE TREATMENT   Patient Name: Debra Hale MRN: [id number] DOB:April 03, 1951, 73 y.o., female Today's Date: 10/04/2023  PCP: Dellar Fenton, MD REFERRING PROVIDER: Lesly Raspberry, MD   End of Session - 10/04/23 1615     Visit Number 2    Number of Visits 12    Date for SLP Re-Evaluation 12/15/23    SLP Start Time 1452    SLP Stop Time  1530    SLP Time Calculation (min) 38 min    Activity Tolerance Patient tolerated treatment well             Past Medical History:  Diagnosis Date   Abdominal pain 03/06/2016   Acute pericarditis 05/31/2013   Anxiety    Arthritis    Osteoarthritis   Breast cancer (HCC) 2009   right breast lumpectomy with rad tx   Colon cancer (HCC)    surgery with chemo and rad tx   Complication of anesthesia    GERD (gastroesophageal reflux disease)    Heart palpitations    History of hiatal hernia    History of kidney stones    History of thyroid  cancer 09/18/2008   Qualifier: Diagnosis of   By: Andrena Bang, CMA, Carol       Hypothyroidism    Liver disease    Liver nodule    s/p negative biopsy   Malignant neoplasm of thyroid  gland (HCC) 2002   s/p surgery and XRT   Osteoporosis    Other and unspecified hyperlipidemia    Palpitations    Personal history of chemotherapy    Personal history of malignant neoplasm of large intestine    carcinoma - cecum, s/p right laparoscopic colectomy - s/p chemotherapy and XRT   Personal history of radiation therapy    Pneumonia 2019   PONV (postoperative nausea and vomiting)    Pure hypercholesterolemia    Unspecified hereditary and idiopathic peripheral neuropathy    Past Surgical History:  Procedure Laterality Date   APPENDECTOMY  1985   BREAST BIOPSY Right 2009   positive   BREAST BIOPSY Right 2009   negative   BREAST LUMPECTOMY Right 2009   breast cancer   CHOLECYSTECTOMY  1995   COLONOSCOPY     COLONOSCOPY WITH PROPOFOL  N/A 10/29/2017   Procedure:  COLONOSCOPY WITH PROPOFOL ;  Surgeon: Cassie Click, MD;  Location: [facility] ENDOSCOPY;  Service: Endoscopy;  Laterality: N/A;   DILATION AND CURETTAGE OF UTERUS  1990   DILATION AND CURETTAGE, DIAGNOSTIC / THERAPEUTIC  1990   ESOPHAGOGASTRODUODENOSCOPY     ESOPHAGOGASTRODUODENOSCOPY (EGD) WITH PROPOFOL  N/A 10/29/2017   Procedure: ESOPHAGOGASTRODUODENOSCOPY (EGD) WITH PROPOFOL ;  Surgeon: Cassie Click, MD;  Location: [facility] ENDOSCOPY;  Service: Endoscopy;  Laterality: N/A;   INCISION AND DRAINAGE PERIRECTAL ABSCESS N/A 06/17/2020   Procedure: IRRIGATION AND DEBRIDEMENT PERIRECTAL ABSCESS;  Surgeon: Alben Alma, MD;  Location: [facility] ORS;  Service: General;  Laterality: N/A;   IR BONE MARROW BIOPSY & ASPIRATION  07/09/2021   IR BONE MARROW BIOPSY & ASPIRATION  09/03/2023   LAPAROSCOPIC PARTIAL COLECTOMY     stage 3-C carcinoma of the cecum, s/p chemotherapy and xrt   LITHOTRIPSY     SIGMOIDOSCOPY  08/26/1993   THYROID  LOBECTOMY  2002   s/p XRT   TOTAL HIP ARTHROPLASTY Right 10/24/2019   Procedure: TOTAL HIP ARTHROPLASTY;  Surgeon: Elner Hahn, MD;  Location: [facility] ORS;  Service: Orthopedics;  Laterality: Right;   Patient Active Problem List   Diagnosis Date  Noted   Hypomagnesemia 08/10/2023   Hoarseness 03/28/2023   Tremor 02/14/2023   Depression with anxiety 04/04/2022   Hypokalemia 04/04/2022   Diarrhea 04/04/2022   Chronic diastolic CHF (congestive heart failure) (HCC) 04/04/2022   Hypophosphatemia 04/04/2022   Electrolyte abnormality 03/31/2022   Hydronephrosis of right kidney 03/31/2022   Vaginal dryness 01/05/2022   Liver nodule 11/18/2021   Major depressive disorder, single episode, mild (HCC) 06/01/2021   Fatty liver disease, nonalcoholic 04/18/2020   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   Pancytopenia (HCC) 02/11/2020   Status post total hip replacement, 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   Lumbar spondylosis 08/25/2019   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/10/2019    Leukopenia 08/21/2018   Change in vision 06/24/2018   Chronic daily headache 05/31/2018   Hypothyroidism 05/02/2018   Vitamin D  deficiency 07/27/2015   Loose stools 04/14/2015   Health care maintenance 06/17/2014   Flatulence, eructation and gas pain 06/17/2014   Stress 01/28/2014   Abnormal respiratory rate 06/19/2013   Environmental allergies 02/25/2013   Abnormal liver function test 12/07/2012   Anxiety 05/06/2012   GERD (gastroesophageal reflux disease) 05/06/2012   Osteopenia 05/06/2012   Hematuria 05/06/2012   Osteoporosis 05/06/2012   History of breast cancer 09/18/2008   History of thyroid  cancer 09/18/2008   Hypercholesterolemia 09/18/2008   Chemotherapy-induced neuropathy (HCC) 09/18/2008   Palpitations 09/18/2008   History of malignant neoplasm of large intestine 09/18/2008   Malignant neoplasm of breast (female) (HCC) 09/18/2008   Malignant neoplasm of thyroid  gland (HCC) 09/18/2008   Hereditary and idiopathic peripheral neuropathy 09/18/2008    ONSET DATE:  ~3 months ago  REFERRING DIAG: Dysphonia, Hoarseness  THERAPY DIAG:  Dysphonia  Rationale for Evaluation and Treatment Rehabilitation  SUBJECTIVE:   SUBJECTIVE STATEMENT: Pt alert and cooperative. Pt accompanied by: self  PERTINENT HISTORY: Reports worsening hoarseness for ~3 months  DIAGNOSTIC FINDINGS: Laryngoscopy 09/2022 "mild bowing of vocal folds"  PAIN:  Are you having pain? No   FALLS: Has patient fallen in last 6 months? No, Number of falls: 0  LIVING ENVIRONMENT: Lives with: lives alone Lives in: House/apartment  PLOF: Independent  PATIENT GOALS    for voice to improve  OBJECTIVE:   TODAY'S TREATMENT:  Introduced HEP for dysphonia. Education completed via rationale as well as changes to speech in setting of Parkinsonism as well as bowed vocal cords.   Pt completed the following with min/mod cues Sustained /a/: x10, 82 dB Ascending pitch glides x10, 80 dB Descending pitch  glides: x10, 83 db   Pt read x10 functional phrases twice through with loud and intentional voice, averaging 85 dB.    Pt benefited from use of biofeeback using Voice Analyst app.   Continued conversation training therapy. Pt able to hear difference between clear speech and dysphonia. Encouraged to utilize clear speech (slow, loud) with appreciable difference in vocal quality.      PATIENT EDUCATION: Education details: as above Person educated: Patient Education method: Explanation Education comprehension: verbalized understanding, returned demonstration, and needs further education   HOME EXERCISE PROGRAM: Practice clear speech therapy     GOALS: Goals reviewed with patient? Yes  SHORT TERM GOALS: Target date: 10 sessions  Pt willverbalize awareness of phonotraumatic behaviors, such as chronic throat clearing, and verbalize/demo non-phonotraumatic alternative to throat clearing. Baseline: Goal status: INITIAL  2.  Pt will be independent in HEP for voice. Baseline:  Goal status: INITIAL  3.  Pt will participate in 5-8 minutes conversation, maintaining average loudness of 75  dB and loud, good quality voice with min cues.     Baseline:  Goal status: INITIAL  4. Pt will demonstrate independent understanding of vocal hygiene concepts.   Baseline:  Goal status: INITIAL    LONG TERM GOALS: Target date: 12 weeks  Pt will improve score on the VHI by 10% indicating improved perception of speech abilities.  Baseline:  Goal status: INITIAL  2.  The patient will participate in 15-20 minutes conversation, maintaining average loudness of 75 dB and loud, good quality voice.    Baseline: 64/120 Goal status: INITIAL   ASSESSMENT:  CLINICAL IMPRESSION: Patient is a 72 y.o. female who was seen today for voice treatment due to complaints of hoarse, raspy, and weak vocal quality. On initial eval, with c/o vocal fatigue stating, "my voice gives out at the end of the day." Pt  currently undergoing chemotx for AML and is reported suboptimal water intake. Pt with hx of Parkinson's Disease, GERD, anxiety, thyroid  cancer, colon cancer, and breast cancer. Based on VHI, pt's dysphonia is severe and with impacts on daily and social communication. Perceptual features of dysphonia observed on evaluation include hoarseness, strain, and fry. When cued to use clear speech therapy, notable subjective and auditory-perceptual improvement in hoarseness observed. Pt with habitual throat clearing observed. Flexible laryngoscopic evaluation completed 09/15/23 was unremarkable except "mild bowing of vocal folds." See details of ST above. Recommend course of ST for dysphonia to improve pt's QoL.   OBJECTIVE IMPAIRMENTS include voice disorder. These impairments are limiting patient from effectively communicating at home and in community. Factors affecting potential to achieve goals and functional outcome are co-morbidities. Patient will benefit from skilled SLP services to address above impairments and improve overall function.  REHAB POTENTIAL: Good  PLAN: SLP FREQUENCY: 1x/week  SLP DURATION: 12 weeks  PLANNED INTERVENTIONS: Environmental controls, Cueing hierachy, Functional tasks, SLP instruction and feedback, Compensatory strategies, and Patient/family education   Dia Forget, M.S., CCC-SLP Speech-Language Pathologist [place] [facility] [phone] [place])   [facility] Outpatient Rehabilitation at [facility] [address] [place], [place], [address] Phone: [phone]   Fax:  [phone]

## 2023-10-05 ENCOUNTER — Other Ambulatory Visit: Payer: Self-pay

## 2023-10-05 MED ORDER — LEVOTHYROXINE SODIUM 50 MCG PO TABS: 50.0000 ug | ORAL_TABLET | Freq: Every day | ORAL | 0 refills | Status: DC

## 2023-10-08 ENCOUNTER — Telehealth: Payer: Self-pay

## 2023-10-08 DIAGNOSIS — Z889 Allergy status to unspecified drugs, medicaments and biological substances status: Secondary | ICD-10-CM | POA: Diagnosis not present

## 2023-10-08 DIAGNOSIS — N390 Urinary tract infection, site not specified: Secondary | ICD-10-CM | POA: Diagnosis not present

## 2023-10-08 DIAGNOSIS — R300 Dysuria: Secondary | ICD-10-CM | POA: Diagnosis not present

## 2023-10-08 DIAGNOSIS — N1831 Chronic kidney disease, stage 3a: Secondary | ICD-10-CM | POA: Diagnosis not present

## 2023-10-08 NOTE — Telephone Encounter (Signed)
 Pt called in with UTI symptoms: burning, frequency and blood in urine. Advised patient that she needs to be seen and we have no availability in office today and she does not need to wait through the weekend.. Pt is going to walk in clinic.

## 2023-10-11 ENCOUNTER — Ambulatory Visit

## 2023-10-12 ENCOUNTER — Ambulatory Visit

## 2023-10-18 ENCOUNTER — Ambulatory Visit

## 2023-10-18 DIAGNOSIS — R490 Dysphonia: Secondary | ICD-10-CM | POA: Diagnosis not present

## 2023-10-18 NOTE — Therapy (Signed)
 OUTPATIENT SPEECH LANGUAGE PATHOLOGY  VOICE TREATMENT   Patient Name: Debra Hale MRN: [id number] DOB:[birth year], 73 y.o., female Today's Date: 10/18/2023  PCP: Dellar Fenton, MD REFERRING PROVIDER: Lesly Raspberry, MD   End of Session - 10/18/23 1438     Visit Number 3    Number of Visits 12    Date for SLP Re-Evaluation 12/15/23    SLP Start Time 1440    SLP Stop Time  1525    SLP Time Calculation (min) 45 min    Activity Tolerance Patient tolerated treatment well          Past Medical History:  Diagnosis Date   Abdominal pain 03/06/2016   Acute pericarditis 05/31/2013   Anxiety    Arthritis    Osteoarthritis   Breast cancer (HCC) 2009   right breast lumpectomy with rad tx   Colon cancer (HCC)    surgery with chemo and rad tx   Complication of anesthesia    GERD (gastroesophageal reflux disease)    Heart palpitations    History of hiatal hernia    History of kidney stones    History of thyroid  cancer 09/18/2008   Qualifier: Diagnosis of   By: Andrena Bang, CMA, Carol       Hypothyroidism    Liver disease    Liver nodule    s/p negative biopsy   Malignant neoplasm of thyroid  gland (HCC) 2002   s/p surgery and XRT   Osteoporosis    Other and unspecified hyperlipidemia    Palpitations    Personal history of chemotherapy    Personal history of malignant neoplasm of large intestine    carcinoma - cecum, s/p right laparoscopic colectomy - s/p chemotherapy and XRT   Personal history of radiation therapy    Pneumonia 2019   PONV (postoperative nausea and vomiting)    Pure hypercholesterolemia    Unspecified hereditary and idiopathic peripheral neuropathy    Past Surgical History:  Procedure Laterality Date   APPENDECTOMY  1985   BREAST BIOPSY Right 2009   positive   BREAST BIOPSY Right 2009   negative   BREAST LUMPECTOMY Right 2009   breast cancer   CHOLECYSTECTOMY  1995   COLONOSCOPY     COLONOSCOPY WITH PROPOFOL  N/A 10/29/2017   Procedure: COLONOSCOPY  WITH PROPOFOL ;  Surgeon: Cassie Click, MD;  Location: [facility] ENDOSCOPY;  Service: Endoscopy;  Laterality: N/A;   DILATION AND CURETTAGE OF UTERUS  1990   DILATION AND CURETTAGE, DIAGNOSTIC / THERAPEUTIC  1990   ESOPHAGOGASTRODUODENOSCOPY     ESOPHAGOGASTRODUODENOSCOPY (EGD) WITH PROPOFOL  N/A 10/29/2017   Procedure: ESOPHAGOGASTRODUODENOSCOPY (EGD) WITH PROPOFOL ;  Surgeon: Cassie Click, MD;  Location: [facility] ENDOSCOPY;  Service: Endoscopy;  Laterality: N/A;   INCISION AND DRAINAGE PERIRECTAL ABSCESS N/A 06/17/2020   Procedure: IRRIGATION AND DEBRIDEMENT PERIRECTAL ABSCESS;  Surgeon: Alben Alma, MD;  Location: [facility] ORS;  Service: General;  Laterality: N/A;   IR BONE MARROW BIOPSY & ASPIRATION  07/09/2021   IR BONE MARROW BIOPSY & ASPIRATION  09/03/2023   LAPAROSCOPIC PARTIAL COLECTOMY     stage 3-C carcinoma of the cecum, s/p chemotherapy and xrt   LITHOTRIPSY     SIGMOIDOSCOPY  08/26/1993   THYROID  LOBECTOMY  2002   s/p XRT   TOTAL HIP ARTHROPLASTY Right 10/24/2019   Procedure: TOTAL HIP ARTHROPLASTY;  Surgeon: Elner Hahn, MD;  Location: [facility] ORS;  Service: Orthopedics;  Laterality: Right;   Patient Active Problem List   Diagnosis Date Noted  Hypomagnesemia 08/10/2023   Hoarseness 03/28/2023   Tremor 02/14/2023   Depression with anxiety 04/04/2022   Hypokalemia 04/04/2022   Diarrhea 04/04/2022   Chronic diastolic CHF (congestive heart failure) (HCC) 04/04/2022   Hypophosphatemia 04/04/2022   Electrolyte abnormality 03/31/2022   Hydronephrosis of right kidney 03/31/2022   Vaginal dryness 01/05/2022   Liver nodule 11/18/2021   Major depressive disorder, single episode, mild (HCC) 06/01/2021   Fatty liver disease, nonalcoholic 04/18/2020   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   Pancytopenia (HCC) 02/11/2020   Status post total hip replacement, 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   Lumbar spondylosis 08/25/2019   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/10/2019   Leukopenia  08/21/2018   Change in vision 06/24/2018   Chronic daily headache 05/31/2018   Hypothyroidism 05/02/2018   Vitamin D  deficiency 07/27/2015   Loose stools 04/14/2015   Health care maintenance 06/17/2014   Flatulence, eructation and gas pain 06/17/2014   Stress 01/28/2014   Abnormal respiratory rate 06/19/2013   Environmental allergies 02/25/2013   Abnormal liver function test 12/07/2012   Anxiety 05/06/2012   GERD (gastroesophageal reflux disease) 05/06/2012   Osteopenia 05/06/2012   Hematuria 05/06/2012   Osteoporosis 05/06/2012   History of breast cancer 09/18/2008   History of thyroid  cancer 09/18/2008   Hypercholesterolemia 09/18/2008   Chemotherapy-induced neuropathy (HCC) 09/18/2008   Palpitations 09/18/2008   History of malignant neoplasm of large intestine 09/18/2008   Malignant neoplasm of breast (female) (HCC) 09/18/2008   Malignant neoplasm of thyroid  gland (HCC) 09/18/2008   Hereditary and idiopathic peripheral neuropathy 09/18/2008    ONSET DATE:  ~3 months ago  REFERRING DIAG: Dysphonia, Hoarseness  THERAPY DIAG:  Dysphonia  Rationale for Evaluation and Treatment Rehabilitation  SUBJECTIVE:   SUBJECTIVE STATEMENT: Pt alert and cooperative. Pt accompanied by: self  PERTINENT HISTORY: Reports worsening hoarseness for ~3 months  DIAGNOSTIC FINDINGS: Laryngoscopy 09/2022 mild bowing of vocal folds  PAIN:  Are you having pain? No   FALLS: Has patient fallen in last 6 months? No, Number of falls: 0  LIVING ENVIRONMENT: Lives with: lives alone Lives in: House/apartment  PLOF: Independent  PATIENT GOALS    for voice to improve  OBJECTIVE:   TODAY'S TREATMENT:  Introduced HEP for dysphonia. Education completed via rationale as well as changes to speech in setting of Parkinsonism as well as bowed vocal cords.   Pt completed the following with min/mod cues Sustained /a/: x10, 84 dB Ascending pitch glides x10, 85 dB Descending pitch glides: x10,  85 db   Pt read x10 functional phrases twice through with loud and intentional voice, averaging 85 dB.    Pt benefited from use of biofeeback using Voice Analyst app.   Continued conversation training therapy. Pt able to hear difference between clear speech and dysphonia. Encouraged to utilize clear speech (slow, loud) with appreciable difference in vocal quality. Pt able to adjust voicing with min cueing during 15 minute speech sample.     PATIENT EDUCATION: Education details: as above Person educated: Patient Education method: Explanation Education comprehension: verbalized understanding, returned demonstration, and needs further education   HOME EXERCISE PROGRAM: Practice clear speech therapy and voice ex's as above     GOALS: Goals reviewed with patient? Yes  SHORT TERM GOALS: Target date: 10 sessions  Pt willverbalize awareness of phonotraumatic behaviors, such as chronic throat clearing, and verbalize/demo non-phonotraumatic alternative to throat clearing. Baseline: Goal status: INITIAL  2.  Pt will be independent in HEP for voice. Baseline:  Goal status: INITIAL  3.  Pt will participate in 5-8 minutes conversation, maintaining average loudness of 75 dB and loud, good quality voice with min cues.     Baseline:  Goal status: INITIAL  4. Pt will demonstrate independent understanding of vocal hygiene concepts.   Baseline:  Goal status: INITIAL    LONG TERM GOALS: Target date: 12 weeks  Pt will improve score on the VHI by 10% indicating improved perception of speech abilities.  Baseline:  Goal status: INITIAL  2.  The patient will participate in 15-20 minutes conversation, maintaining average loudness of 75 dB and loud, good quality voice.    Baseline: 64/120 Goal status: INITIAL   ASSESSMENT:  CLINICAL IMPRESSION: Patient is a 73 y.o. female who was seen today for voice treatment due to complaints of hoarse, raspy, and weak vocal quality. On initial  eval, with c/o vocal fatigue stating, my voice gives out at the end of the day. Pt currently undergoing chemotx for AML and is reported suboptimal water intake. Pt with hx of Parkinson's Disease, GERD, anxiety, thyroid  cancer, colon cancer, and breast cancer. Based on VHI, pt's dysphonia is severe and with impacts on daily and social communication. Perceptual features of dysphonia observed on evaluation include hoarseness, strain, and fry. When cued to use clear speech therapy, notable subjective and auditory-perceptual improvement in hoarseness observed. Pt with habitual throat clearing observed. Flexible laryngoscopic evaluation completed 09/15/23 was unremarkable except mild bowing of vocal folds. See details of ST above. Recommend course of ST for dysphonia to improve pt's QoL.   OBJECTIVE IMPAIRMENTS include voice disorder. These impairments are limiting patient from effectively communicating at home and in community. Factors affecting potential to achieve goals and functional outcome are co-morbidities. Patient will benefit from skilled SLP services to address above impairments and improve overall function.  REHAB POTENTIAL: Good  PLAN: SLP FREQUENCY: 1x/week  SLP DURATION: 12 weeks  PLANNED INTERVENTIONS: Environmental controls, Cueing hierachy, Functional tasks, SLP instruction and feedback, Compensatory strategies, and Patient/family education   Dia Forget, M.S., CCC-SLP Speech-Language Pathologist [place] [facility] [phone] [place])   [facility] at [facility] [address] [place], [place], [address] Phone: [phone]   Fax:  [phone]

## 2023-10-19 ENCOUNTER — Encounter

## 2023-10-20 ENCOUNTER — Other Ambulatory Visit: Payer: Self-pay | Admitting: Internal Medicine

## 2023-10-25 ENCOUNTER — Other Ambulatory Visit (HOSPITAL_COMMUNITY): Payer: Self-pay

## 2023-10-25 ENCOUNTER — Ambulatory Visit

## 2023-10-25 DIAGNOSIS — R490 Dysphonia: Secondary | ICD-10-CM

## 2023-10-25 NOTE — Therapy (Signed)
 OUTPATIENT SPEECH LANGUAGE PATHOLOGY  VOICE TREATMENT   Patient Name: Debra Hale MRN: [id number] DOB:[birth year], 72 y.o., female Today's Date: 10/25/2023  PCP: Allena Hamilton, MD REFERRING PROVIDER: Chinita Hasten, MD   End of Session - 10/25/23 1353     Visit Number 4    Number of Visits 12    Date for SLP Re-Evaluation 12/15/23    SLP Start Time 1400    SLP Stop Time  1445    SLP Time Calculation (min) 45 min    Activity Tolerance Patient tolerated treatment well          Past Medical History:  Diagnosis Date   Abdominal pain 03/06/2016   Acute pericarditis 05/31/2013   Anxiety    Arthritis    Osteoarthritis   Breast cancer (HCC) 2009   right breast lumpectomy with rad tx   Colon cancer (HCC)    surgery with chemo and rad tx   Complication of anesthesia    GERD (gastroesophageal reflux disease)    Heart palpitations    History of hiatal hernia    History of kidney stones    History of thyroid  cancer 09/18/2008   Qualifier: Diagnosis of   By: Wynetta, CMA, Carol       Hypothyroidism    Liver disease    Liver nodule    s/p negative biopsy   Malignant neoplasm of thyroid  gland (HCC) 2002   s/p surgery and XRT   Osteoporosis    Other and unspecified hyperlipidemia    Palpitations    Personal history of chemotherapy    Personal history of malignant neoplasm of large intestine    carcinoma - cecum, s/p right laparoscopic colectomy - s/p chemotherapy and XRT   Personal history of radiation therapy    Pneumonia 2019   PONV (postoperative nausea and vomiting)    Pure hypercholesterolemia    Unspecified hereditary and idiopathic peripheral neuropathy    Past Surgical History:  Procedure Laterality Date   APPENDECTOMY  1985   BREAST BIOPSY Right 2009   positive   BREAST BIOPSY Right 2009   negative   BREAST LUMPECTOMY Right 2009   breast cancer   CHOLECYSTECTOMY  1995   COLONOSCOPY     COLONOSCOPY WITH PROPOFOL  N/A 10/29/2017   Procedure: COLONOSCOPY  WITH PROPOFOL ;  Surgeon: Viktoria Lamar DASEN, MD;  Location: [facility] ENDOSCOPY;  Service: Endoscopy;  Laterality: N/A;   DILATION AND CURETTAGE OF UTERUS  1990   DILATION AND CURETTAGE, DIAGNOSTIC / THERAPEUTIC  1990   ESOPHAGOGASTRODUODENOSCOPY     ESOPHAGOGASTRODUODENOSCOPY (EGD) WITH PROPOFOL  N/A 10/29/2017   Procedure: ESOPHAGOGASTRODUODENOSCOPY (EGD) WITH PROPOFOL ;  Surgeon: Viktoria Lamar DASEN, MD;  Location: [facility] ENDOSCOPY;  Service: Endoscopy;  Laterality: N/A;   INCISION AND DRAINAGE PERIRECTAL ABSCESS N/A 06/17/2020   Procedure: IRRIGATION AND DEBRIDEMENT PERIRECTAL ABSCESS;  Surgeon: Jordis Laneta FALCON, MD;  Location: [facility] ORS;  Service: General;  Laterality: N/A;   IR BONE MARROW BIOPSY & ASPIRATION  07/09/2021   IR BONE MARROW BIOPSY & ASPIRATION  09/03/2023   LAPAROSCOPIC PARTIAL COLECTOMY     stage 3-C carcinoma of the cecum, s/p chemotherapy and xrt   LITHOTRIPSY     SIGMOIDOSCOPY  08/26/1993   THYROID  LOBECTOMY  2002   s/p XRT   TOTAL HIP ARTHROPLASTY Right 10/24/2019   Procedure: TOTAL HIP ARTHROPLASTY;  Surgeon: Edie Norleen PARAS, MD;  Location: [facility] ORS;  Service: Orthopedics;  Laterality: Right;   Patient Active Problem List   Diagnosis Date Noted  Hypomagnesemia 08/10/2023   Hoarseness 03/28/2023   Tremor 02/14/2023   Depression with anxiety 04/04/2022   Hypokalemia 04/04/2022   Diarrhea 04/04/2022   Chronic diastolic CHF (congestive heart failure) (HCC) 04/04/2022   Hypophosphatemia 04/04/2022   Electrolyte abnormality 03/31/2022   Hydronephrosis of right kidney 03/31/2022   Vaginal dryness 01/05/2022   Liver nodule 11/18/2021   Major depressive disorder, single episode, mild (HCC) 06/01/2021   Fatty liver disease, nonalcoholic 04/18/2020   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   Pancytopenia (HCC) 02/11/2020   Status post total hip replacement, 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   Lumbar spondylosis 08/25/2019   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/10/2019   Leukopenia  08/21/2018   Change in vision 06/24/2018   Chronic daily headache 05/31/2018   Hypothyroidism 05/02/2018   Vitamin D  deficiency 07/27/2015   Loose stools 04/14/2015   Health care maintenance 06/17/2014   Flatulence, eructation and gas pain 06/17/2014   Stress 01/28/2014   Abnormal respiratory rate 06/19/2013   Environmental allergies 02/25/2013   Abnormal liver function test 12/07/2012   Anxiety 05/06/2012   GERD (gastroesophageal reflux disease) 05/06/2012   Osteopenia 05/06/2012   Hematuria 05/06/2012   Osteoporosis 05/06/2012   History of breast cancer 09/18/2008   History of thyroid  cancer 09/18/2008   Hypercholesterolemia 09/18/2008   Chemotherapy-induced neuropathy (HCC) 09/18/2008   Palpitations 09/18/2008   History of malignant neoplasm of large intestine 09/18/2008   Malignant neoplasm of breast (female) (HCC) 09/18/2008   Malignant neoplasm of thyroid  gland (HCC) 09/18/2008   Hereditary and idiopathic peripheral neuropathy 09/18/2008    ONSET DATE:  ~3 months ago  REFERRING DIAG: Dysphonia, Hoarseness  THERAPY DIAG:  Dysphonia  Rationale for Evaluation and Treatment Rehabilitation  SUBJECTIVE:   SUBJECTIVE STATEMENT: Pt alert and cooperative. Pt accompanied by: self  PERTINENT HISTORY: Reports worsening hoarseness for ~3 months  DIAGNOSTIC FINDINGS: Laryngoscopy 09/2022 mild bowing of vocal folds  PAIN:  Are you having pain? No   FALLS: Has patient fallen in last 6 months? No, Number of falls: 0  LIVING ENVIRONMENT: Lives with: lives alone Lives in: House/apartment  PLOF: Independent  PATIENT GOALS    for voice to improve  OBJECTIVE:   TODAY'S TREATMENT:  Reviewed HEP for dysphonia. Education completed via rationale as well as changes to speech in setting of Parkinsonism as well as bowed vocal cords.   Pt completed the following with min/mod cues Sustained /a/: x10, 84 dB Ascending pitch glides x10, 85 dB Descending pitch glides: x10, 85  db   Pt read x10 functional phrases twice through with loud and intentional voice, averaging 86 dB.    Pt benefited from use of biofeeback using Voice Analyst app.   Continued conversation training therapy. Pt able to hear difference between clear speech and dysphonia. Encouraged to utilize clear speech (slow, loud) with appreciable difference in vocal quality. Pt able to adjust voicing with rare-min cueing during 15 minute speech sample.  Education also completed re: substitute behaviors for throat clearing. Pt able to demo x2 strategies after initial SLP demo.     PATIENT EDUCATION: Education details: as above Person educated: Patient Education method: Explanation Education comprehension: verbalized understanding, returned demonstration, and needs further education   HOME EXERCISE PROGRAM: Practice clear speech therapy and voice ex's as above     GOALS: Goals reviewed with patient? Yes  SHORT TERM GOALS: Target date: 10 sessions  Pt willverbalize awareness of phonotraumatic behaviors, such as chronic throat clearing, and verbalize/demo non-phonotraumatic alternative to throat clearing. Baseline:  Goal status: INITIAL  2.  Pt will be independent in HEP for voice. Baseline:  Goal status: INITIAL  3.  Pt will participate in 5-8 minutes conversation, maintaining average loudness of 75 dB and loud, good quality voice with min cues.     Baseline:  Goal status: INITIAL  4. Pt will demonstrate independent understanding of vocal hygiene concepts.   Baseline:  Goal status: INITIAL    LONG TERM GOALS: Target date: 12 weeks  Pt will improve score on the VHI by 10% indicating improved perception of speech abilities.  Baseline:  Goal status: INITIAL  2.  The patient will participate in 15-20 minutes conversation, maintaining average loudness of 75 dB and loud, good quality voice.    Baseline: 64/120 Goal status: INITIAL   ASSESSMENT:  CLINICAL IMPRESSION: Patient is  a 73 y.o. female who was seen today for voice treatment due to complaints of hoarse, raspy, and weak vocal quality. On initial eval, with c/o vocal fatigue stating, my voice gives out at the end of the day. Pt currently undergoing chemotx for AML and is reported suboptimal water intake. Pt with hx of Parkinson's Disease, GERD, anxiety, thyroid  cancer, colon cancer, and breast cancer. Based on VHI, pt's dysphonia is severe and with impacts on daily and social communication. Perceptual features of dysphonia observed on evaluation include hoarseness, strain, and fry. When cued to use clear speech therapy, notable subjective and auditory-perceptual improvement in hoarseness observed. Pt with habitual throat clearing observed. Flexible laryngoscopic evaluation completed 09/15/23 was unremarkable except mild bowing of vocal folds. See details of ST above. Recommend course of ST for dysphonia to improve pt's QoL.   OBJECTIVE IMPAIRMENTS include voice disorder. These impairments are limiting patient from effectively communicating at home and in community. Factors affecting potential to achieve goals and functional outcome are co-morbidities. Patient will benefit from skilled SLP services to address above impairments and improve overall function.  REHAB POTENTIAL: Good  PLAN: SLP FREQUENCY: 1x/week  SLP DURATION: 12 weeks  PLANNED INTERVENTIONS: Environmental controls, Cueing hierachy, Functional tasks, SLP instruction and feedback, Compensatory strategies, and Patient/family education   Delon Bangs, M.S., CCC-SLP Speech-Language Pathologist [place] [facility] [place] [phone] [place])   [place] [facility] Out[facility] Rehabilitation at [facility] [address] [place], [place], [address] Phone: [phone]   Fax:  [phone]

## 2023-10-26 ENCOUNTER — Encounter

## 2023-10-27 ENCOUNTER — Other Ambulatory Visit: Payer: Self-pay

## 2023-10-27 ENCOUNTER — Other Ambulatory Visit (HOSPITAL_COMMUNITY): Payer: Self-pay

## 2023-10-27 NOTE — Progress Notes (Signed)
 Specialty Pharmacy Refill Coordination Note  Spoke with Debra Hale (Self).   Debra Hale is a 73 y.o. female contacted today regarding refills of specialty medication(s) Olutasidenib  (REZLIDHIA )  Patient requested: Delivery   Delivery date: 10/29/23   Verified address: [address]  [place] [address]  Medication will be filled on 10/28/23.

## 2023-10-28 ENCOUNTER — Other Ambulatory Visit: Payer: Self-pay

## 2023-11-02 ENCOUNTER — Ambulatory Visit: Attending: Unknown Physician Specialty

## 2023-11-02 DIAGNOSIS — R490 Dysphonia: Secondary | ICD-10-CM | POA: Insufficient documentation

## 2023-11-02 NOTE — Therapy (Signed)
 OUTPATIENT SPEECH LANGUAGE PATHOLOGY  VOICE TREATMENT   Patient Name: Debra Hale MRN: [id number] DOB:[birth year], 73 y.o., female Today's Date: 11/02/2023  PCP: Allena Hamilton, MD REFERRING PROVIDER: Chinita Hasten, MD   End of Session - 11/02/23 1149     Visit Number 5    Number of Visits 12    Date for SLP Re-Evaluation 12/15/23    SLP Start Time 1150    SLP Stop Time  1230    SLP Time Calculation (min) 40 min    Activity Tolerance Patient tolerated treatment well          Past Medical History:  Diagnosis Date   Abdominal pain 03/06/2016   Acute pericarditis 05/31/2013   Anxiety    Arthritis    Osteoarthritis   Breast cancer (HCC) 2009   right breast lumpectomy with rad tx   Colon cancer (HCC)    surgery with chemo and rad tx   Complication of anesthesia    GERD (gastroesophageal reflux disease)    Heart palpitations    History of hiatal hernia    History of kidney stones    History of thyroid  cancer 09/18/2008   Qualifier: Diagnosis of   By: Wynetta, CMA, Carol       Hypothyroidism    Liver disease    Liver nodule    s/p negative biopsy   Malignant neoplasm of thyroid  gland (HCC) 2002   s/p surgery and XRT   Osteoporosis    Other and unspecified hyperlipidemia    Palpitations    Personal history of chemotherapy    Personal history of malignant neoplasm of large intestine    carcinoma - cecum, s/p right laparoscopic colectomy - s/p chemotherapy and XRT   Personal history of radiation therapy    Pneumonia 2019   PONV (postoperative nausea and vomiting)    Pure hypercholesterolemia    Unspecified hereditary and idiopathic peripheral neuropathy    Past Surgical History:  Procedure Laterality Date   APPENDECTOMY  1985   BREAST BIOPSY Right 2009   positive   BREAST BIOPSY Right 2009   negative   BREAST LUMPECTOMY Right 2009   breast cancer   CHOLECYSTECTOMY  1995   COLONOSCOPY     COLONOSCOPY WITH PROPOFOL  N/A 10/29/2017   Procedure: COLONOSCOPY  WITH PROPOFOL ;  Surgeon: Viktoria Lamar DASEN, MD;  Location: [facility] ENDOSCOPY;  Service: Endoscopy;  Laterality: N/A;   DILATION AND CURETTAGE OF UTERUS  1990   DILATION AND CURETTAGE, DIAGNOSTIC / THERAPEUTIC  1990   ESOPHAGOGASTRODUODENOSCOPY     ESOPHAGOGASTRODUODENOSCOPY (EGD) WITH PROPOFOL  N/A 10/29/2017   Procedure: ESOPHAGOGASTRODUODENOSCOPY (EGD) WITH PROPOFOL ;  Surgeon: Viktoria Lamar DASEN, MD;  Location: [facility] ENDOSCOPY;  Service: Endoscopy;  Laterality: N/A;   INCISION AND DRAINAGE PERIRECTAL ABSCESS N/A 06/17/2020   Procedure: IRRIGATION AND DEBRIDEMENT PERIRECTAL ABSCESS;  Surgeon: Jordis Laneta FALCON, MD;  Location: [facility] ORS;  Service: General;  Laterality: N/A;   IR BONE MARROW BIOPSY & ASPIRATION  07/09/2021   IR BONE MARROW BIOPSY & ASPIRATION  09/03/2023   LAPAROSCOPIC PARTIAL COLECTOMY     stage 3-C carcinoma of the cecum, s/p chemotherapy and xrt   LITHOTRIPSY     SIGMOIDOSCOPY  08/26/1993   THYROID  LOBECTOMY  2002   s/p XRT   TOTAL HIP ARTHROPLASTY Right 10/24/2019   Procedure: TOTAL HIP ARTHROPLASTY;  Surgeon: Edie Norleen PARAS, MD;  Location: [facility] ORS;  Service: Orthopedics;  Laterality: Right;   Patient Active Problem List   Diagnosis Date Noted  Hypomagnesemia 08/10/2023   Hoarseness 03/28/2023   Tremor 02/14/2023   Depression with anxiety 04/04/2022   Hypokalemia 04/04/2022   Diarrhea 04/04/2022   Chronic diastolic CHF (congestive heart failure) (HCC) 04/04/2022   Hypophosphatemia 04/04/2022   Electrolyte abnormality 03/31/2022   Hydronephrosis of right kidney 03/31/2022   Vaginal dryness 01/05/2022   Liver nodule 11/18/2021   Major depressive disorder, single episode, mild (HCC) 06/01/2021   Fatty liver disease, nonalcoholic 04/18/2020   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   Pancytopenia (HCC) 02/11/2020   Status post total hip replacement, 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   Lumbar spondylosis 08/25/2019   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/10/2019   Leukopenia  08/21/2018   Change in vision 06/24/2018   Chronic daily headache 05/31/2018   Hypothyroidism 05/02/2018   Vitamin D  deficiency 07/27/2015   Loose stools 04/14/2015   Health care maintenance 06/17/2014   Flatulence, eructation and gas pain 06/17/2014   Stress 01/28/2014   Abnormal respiratory rate 06/19/2013   Environmental allergies 02/25/2013   Abnormal liver function test 12/07/2012   Anxiety 05/06/2012   GERD (gastroesophageal reflux disease) 05/06/2012   Osteopenia 05/06/2012   Hematuria 05/06/2012   Osteoporosis 05/06/2012   History of breast cancer 09/18/2008   History of thyroid  cancer 09/18/2008   Hypercholesterolemia 09/18/2008   Chemotherapy-induced neuropathy (HCC) 09/18/2008   Palpitations 09/18/2008   History of malignant neoplasm of large intestine 09/18/2008   Malignant neoplasm of breast (female) (HCC) 09/18/2008   Malignant neoplasm of thyroid  gland (HCC) 09/18/2008   Hereditary and idiopathic peripheral neuropathy 09/18/2008    ONSET DATE:  ~3 months ago  REFERRING DIAG: Dysphonia, Hoarseness  THERAPY DIAG:  Dysphonia  Rationale for Evaluation and Treatment Rehabilitation  SUBJECTIVE:   SUBJECTIVE STATEMENT: Pt alert and cooperative. Pt accompanied by: self  PERTINENT HISTORY: Reports worsening hoarseness for ~3 months  DIAGNOSTIC FINDINGS: Laryngoscopy 09/2022 mild bowing of vocal folds  PAIN:  Are you having pain? No   FALLS: Has patient fallen in last 6 months? No, Number of falls: 0  LIVING ENVIRONMENT: Lives with: lives alone Lives in: House/apartment  PLOF: Independent  PATIENT GOALS    for voice to improve  OBJECTIVE:   TODAY'S TREATMENT:  Reviewed HEP for dysphonia. Education completed via rationale as well as changes to speech in setting of Parkinsonism as well as bowed vocal cords.  Continued conversation training therapy. Pt able to hear difference between clear speech and dysphonia. Encouraged to utilize clear speech  (slow, loud) with appreciable difference in vocal quality. Pt able to adjust voicing with rare-min cueing during 15 minute speech sample.  Education also completed re: substitute behaviors for throat clearing, general vocal hygiene, and head/neck stretches to reduce tension.      PATIENT EDUCATION: Education details: as above Person educated: Patient Education method: Programmer, multimedia; Handout provided Education comprehension: verbalized understanding, returned demonstration, and needs further education   HOME EXERCISE PROGRAM: Practice clear speech therapy and voice ex's as above     GOALS: Goals reviewed with patient? Yes  SHORT TERM GOALS: Target date: 10 sessions  Pt willverbalize awareness of phonotraumatic behaviors, such as chronic throat clearing, and verbalize/demo non-phonotraumatic alternative to throat clearing. Baseline: Goal status: INITIAL  2.  Pt will be independent in HEP for voice. Baseline:  Goal status: INITIAL  3.  Pt will participate in 5-8 minutes conversation, maintaining average loudness of 75 dB and loud, good quality voice with min cues.     Baseline:  Goal status: INITIAL  4. Pt  will demonstrate independent understanding of vocal hygiene concepts.   Baseline:  Goal status: INITIAL    LONG TERM GOALS: Target date: 12 weeks  Pt will improve score on the VHI by 10% indicating improved perception of speech abilities.  Baseline:  Goal status: INITIAL  2.  The patient will participate in 15-20 minutes conversation, maintaining average loudness of 75 dB and loud, good quality voice.    Baseline: 64/120 Goal status: INITIAL   ASSESSMENT:  CLINICAL IMPRESSION: Patient is a 73 y.o. female who was seen today for voice treatment due to complaints of hoarse, raspy, and weak vocal quality. On initial eval, with c/o vocal fatigue stating, my voice gives out at the end of the day. Pt currently undergoing chemotx for AML and is reported suboptimal  water intake. Pt with hx of Parkinson's Disease, GERD, anxiety, thyroid  cancer, colon cancer, and breast cancer. Based on VHI, pt's dysphonia is severe and with impacts on daily and social communication. Perceptual features of dysphonia observed on evaluation include hoarseness, strain, and fry. When cued to use clear speech therapy, notable subjective and auditory-perceptual improvement in hoarseness observed. Pt with habitual throat clearing observed. Flexible laryngoscopic evaluation completed 09/15/23 was unremarkable except mild bowing of vocal folds. See details of ST above. Recommend course of ST for dysphonia to improve pt's QoL.   OBJECTIVE IMPAIRMENTS include voice disorder. These impairments are limiting patient from effectively communicating at home and in community. Factors affecting potential to achieve goals and functional outcome are co-morbidities. Patient will benefit from skilled SLP services to address above impairments and improve overall function.  REHAB POTENTIAL: Good  PLAN: SLP FREQUENCY: 1x/week  SLP DURATION: 12 weeks  PLANNED INTERVENTIONS: Environmental controls, Cueing hierachy, Functional tasks, SLP instruction and feedback, Compensatory strategies, and Patient/family education   Delon Bangs, M.S., CCC-SLP Speech-Language Pathologist [place] - [facility] [phone] [place])   [facility] Outpatient Rehabilitation at [facility] [address] [place], [place], [address] Phone: [phone]   Fax:  [phone]

## 2023-11-09 ENCOUNTER — Ambulatory Visit

## 2023-11-09 ENCOUNTER — Ambulatory Visit: Admitting: Internal Medicine

## 2023-11-09 ENCOUNTER — Encounter: Payer: Self-pay | Admitting: Internal Medicine

## 2023-11-09 VITALS — BP 104/68 | HR 97 | Temp 97.9°F | Resp 20 | Ht 64.0 in | Wt 120.1 lb

## 2023-11-09 DIAGNOSIS — Z85850 Personal history of malignant neoplasm of thyroid: Secondary | ICD-10-CM | POA: Diagnosis not present

## 2023-11-09 DIAGNOSIS — F419 Anxiety disorder, unspecified: Secondary | ICD-10-CM

## 2023-11-09 DIAGNOSIS — K219 Gastro-esophageal reflux disease without esophagitis: Secondary | ICD-10-CM | POA: Diagnosis not present

## 2023-11-09 DIAGNOSIS — I5032 Chronic diastolic (congestive) heart failure: Secondary | ICD-10-CM

## 2023-11-09 DIAGNOSIS — M810 Age-related osteoporosis without current pathological fracture: Secondary | ICD-10-CM | POA: Diagnosis not present

## 2023-11-09 DIAGNOSIS — R197 Diarrhea, unspecified: Secondary | ICD-10-CM

## 2023-11-09 DIAGNOSIS — D61818 Other pancytopenia: Secondary | ICD-10-CM

## 2023-11-09 DIAGNOSIS — F320 Major depressive disorder, single episode, mild: Secondary | ICD-10-CM

## 2023-11-09 DIAGNOSIS — Z85038 Personal history of other malignant neoplasm of large intestine: Secondary | ICD-10-CM | POA: Diagnosis not present

## 2023-11-09 DIAGNOSIS — G620 Drug-induced polyneuropathy: Secondary | ICD-10-CM

## 2023-11-09 DIAGNOSIS — E7800 Pure hypercholesterolemia, unspecified: Secondary | ICD-10-CM | POA: Diagnosis not present

## 2023-11-09 DIAGNOSIS — T451X5A Adverse effect of antineoplastic and immunosuppressive drugs, initial encounter: Secondary | ICD-10-CM

## 2023-11-09 DIAGNOSIS — F439 Reaction to severe stress, unspecified: Secondary | ICD-10-CM | POA: Diagnosis not present

## 2023-11-09 DIAGNOSIS — E039 Hypothyroidism, unspecified: Secondary | ICD-10-CM

## 2023-11-09 DIAGNOSIS — C9200 Acute myeloblastic leukemia, not having achieved remission: Secondary | ICD-10-CM

## 2023-11-09 DIAGNOSIS — Z853 Personal history of malignant neoplasm of breast: Secondary | ICD-10-CM | POA: Diagnosis not present

## 2023-11-09 DIAGNOSIS — E559 Vitamin D deficiency, unspecified: Secondary | ICD-10-CM

## 2023-11-09 DIAGNOSIS — R490 Dysphonia: Secondary | ICD-10-CM

## 2023-11-09 NOTE — Therapy (Signed)
 OUTPATIENT SPEECH LANGUAGE PATHOLOGY  VOICE TREATMENT   Patient Name: Debra Hale MRN: [id number] DOB:[birth year], 73 y.o., female Today's Date: 11/09/2023  PCP: Allena Hamilton, MD REFERRING PROVIDER: Chinita Hasten, MD   End of Session - 11/09/23 1142     Visit Number 6    Number of Visits 12    Date for SLP Re-Evaluation 12/15/23    SLP Start Time 1145    SLP Stop Time  1230    SLP Time Calculation (min) 45 min    Activity Tolerance Patient tolerated treatment well          Past Medical History:  Diagnosis Date   Abdominal pain 03/06/2016   Acute pericarditis 05/31/2013   Anxiety    Arthritis    Osteoarthritis   Breast cancer (HCC) 2009   right breast lumpectomy with rad tx   Colon cancer (HCC)    surgery with chemo and rad tx   Complication of anesthesia    GERD (gastroesophageal reflux disease)    Heart palpitations    History of hiatal hernia    History of kidney stones    History of thyroid  cancer 09/18/2008   Qualifier: Diagnosis of   By: Wynetta, CMA, Carol       Hypothyroidism    Liver disease    Liver nodule    s/p negative biopsy   Malignant neoplasm of thyroid  gland (HCC) 2002   s/p surgery and XRT   Osteoporosis    Other and unspecified hyperlipidemia    Palpitations    Personal history of chemotherapy    Personal history of malignant neoplasm of large intestine    carcinoma - cecum, s/p right laparoscopic colectomy - s/p chemotherapy and XRT   Personal history of radiation therapy    Pneumonia 2019   PONV (postoperative nausea and vomiting)    Pure hypercholesterolemia    Unspecified hereditary and idiopathic peripheral neuropathy    Past Surgical History:  Procedure Laterality Date   APPENDECTOMY  1985   BREAST BIOPSY Right 2009   positive   BREAST BIOPSY Right 2009   negative   BREAST LUMPECTOMY Right 2009   breast cancer   CHOLECYSTECTOMY  1995   COLONOSCOPY     COLONOSCOPY WITH PROPOFOL  N/A 10/29/2017   Procedure: COLONOSCOPY  WITH PROPOFOL ;  Surgeon: Viktoria Lamar DASEN, MD;  Location: [facility] ENDOSCOPY;  Service: Endoscopy;  Laterality: N/A;   DILATION AND CURETTAGE OF UTERUS  1990   DILATION AND CURETTAGE, DIAGNOSTIC / THERAPEUTIC  1990   ESOPHAGOGASTRODUODENOSCOPY     ESOPHAGOGASTRODUODENOSCOPY (EGD) WITH PROPOFOL  N/A 10/29/2017   Procedure: ESOPHAGOGASTRODUODENOSCOPY (EGD) WITH PROPOFOL ;  Surgeon: Viktoria Lamar DASEN, MD;  Location: [facility] ENDOSCOPY;  Service: Endoscopy;  Laterality: N/A;   INCISION AND DRAINAGE PERIRECTAL ABSCESS N/A 06/17/2020   Procedure: IRRIGATION AND DEBRIDEMENT PERIRECTAL ABSCESS;  Surgeon: Jordis Laneta FALCON, MD;  Location: [facility] ORS;  Service: General;  Laterality: N/A;   IR BONE MARROW BIOPSY & ASPIRATION  07/09/2021   IR BONE MARROW BIOPSY & ASPIRATION  09/03/2023   LAPAROSCOPIC PARTIAL COLECTOMY     stage 3-C carcinoma of the cecum, s/p chemotherapy and xrt   LITHOTRIPSY     SIGMOIDOSCOPY  08/26/1993   THYROID  LOBECTOMY  2002   s/p XRT   TOTAL HIP ARTHROPLASTY Right 10/24/2019   Procedure: TOTAL HIP ARTHROPLASTY;  Surgeon: Edie Norleen PARAS, MD;  Location: [facility] ORS;  Service: Orthopedics;  Laterality: Right;   Patient Active Problem List   Diagnosis Date Noted  Hypomagnesemia 08/10/2023   Hoarseness 03/28/2023   Tremor 02/14/2023   Depression with anxiety 04/04/2022   Hypokalemia 04/04/2022   Diarrhea 04/04/2022   Chronic diastolic CHF (congestive heart failure) (HCC) 04/04/2022   Hypophosphatemia 04/04/2022   Electrolyte abnormality 03/31/2022   Hydronephrosis of right kidney 03/31/2022   Vaginal dryness 01/05/2022   Liver nodule 11/18/2021   Major depressive disorder, single episode, mild (HCC) 06/01/2021   Fatty liver disease, nonalcoholic 04/18/2020   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   Pancytopenia (HCC) 02/11/2020   Status post total hip replacement, 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   Lumbar spondylosis 08/25/2019   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/10/2019   Leukopenia  08/21/2018   Change in vision 06/24/2018   Chronic daily headache 05/31/2018   Hypothyroidism 05/02/2018   Vitamin D  deficiency 07/27/2015   Loose stools 04/14/2015   Health care maintenance 06/17/2014   Flatulence, eructation and gas pain 06/17/2014   Stress 01/28/2014   Abnormal respiratory rate 06/19/2013   Environmental allergies 02/25/2013   Abnormal liver function test 12/07/2012   Anxiety 05/06/2012   GERD (gastroesophageal reflux disease) 05/06/2012   Osteopenia 05/06/2012   Hematuria 05/06/2012   Osteoporosis 05/06/2012   History of breast cancer 09/18/2008   History of thyroid  cancer 09/18/2008   Hypercholesterolemia 09/18/2008   Chemotherapy-induced neuropathy (HCC) 09/18/2008   Palpitations 09/18/2008   History of malignant neoplasm of large intestine 09/18/2008   Malignant neoplasm of breast (female) (HCC) 09/18/2008   Malignant neoplasm of thyroid  gland (HCC) 09/18/2008   Hereditary and idiopathic peripheral neuropathy 09/18/2008    ONSET DATE:  ~3 months ago  REFERRING DIAG: Dysphonia, Hoarseness  THERAPY DIAG:  Dysphonia  Rationale for Evaluation and Treatment Rehabilitation  SUBJECTIVE:   SUBJECTIVE STATEMENT: Pt alert and cooperative. Pt accompanied by: self  PERTINENT HISTORY: Reports worsening hoarseness for ~3 months  DIAGNOSTIC FINDINGS: Laryngoscopy 09/2022 mild bowing of vocal folds  PAIN:  Are you having pain? No   FALLS: Has patient fallen in last 6 months? No, Number of falls: 0  LIVING ENVIRONMENT: Lives with: lives alone Lives in: House/apartment  PLOF: Independent  PATIENT GOALS    for voice to improve  OBJECTIVE:   TODAY'S TREATMENT:  Reviewed HEP for dysphonia. Pt completed with use of biofeedback/Voice Analyst app averaging 84-85dB for sustained phonation, ascending and descending pitch glides, and phrases. Pt completed each ex x10.  Education completed via rationale as well as changes to speech in setting of  Parkinsonism as well as bowed vocal cords.  Continued conversation training therapy. Pt able to hear difference between clear speech and dysphonia. Encouraged to utilize clear speech (slow, loud) with appreciable difference in vocal quality. Pt able to adjust voicing with rare-min cueing during 15 minute speech sample.  Education also completed re: substitute behaviors for throat clearing, general vocal hygiene, and head/neck stretches to reduce tension.      PATIENT EDUCATION: Education details: as above Person educated: Patient Education method: Programmer, multimedia; Handout provided Education comprehension: verbalized understanding, returned demonstration, and needs further education   HOME EXERCISE PROGRAM: Practice clear speech therapy and voice ex's as above     GOALS: Goals reviewed with patient? Yes  SHORT TERM GOALS: Target date: 10 sessions  Pt willverbalize awareness of phonotraumatic behaviors, such as chronic throat clearing, and verbalize/demo non-phonotraumatic alternative to throat clearing. Baseline: Goal status: INITIAL  2.  Pt will be independent in HEP for voice. Baseline:  Goal status: INITIAL  3.  Pt will participate in 5-8 minutes conversation,  maintaining average loudness of 75 dB and loud, good quality voice with min cues.     Baseline:  Goal status: INITIAL  4. Pt will demonstrate independent understanding of vocal hygiene concepts.   Baseline:  Goal status: INITIAL    LONG TERM GOALS: Target date: 12 weeks  Pt will improve score on the VHI by 10% indicating improved perception of speech abilities.  Baseline:  Goal status: INITIAL  2.  The patient will participate in 15-20 minutes conversation, maintaining average loudness of 75 dB and loud, good quality voice.    Baseline: 64/120 Goal status: INITIAL   ASSESSMENT:  CLINICAL IMPRESSION: Patient is a 73 y.o. female who was seen today for voice treatment due to complaints of hoarse, raspy, and  weak vocal quality. On initial eval, with c/o vocal fatigue stating, my voice gives out at the end of the day. Pt currently undergoing chemotx for AML and is reported suboptimal water intake. Pt with hx of Parkinson's Disease, GERD, anxiety, thyroid  cancer, colon cancer, and breast cancer. Based on VHI, pt's dysphonia is severe and with impacts on daily and social communication. Perceptual features of dysphonia observed on evaluation include hoarseness, strain, and fry. When cued to use clear speech therapy, notable subjective and auditory-perceptual improvement in hoarseness observed. Pt with habitual throat clearing observed. Flexible laryngoscopic evaluation completed 09/15/23 was unremarkable except mild bowing of vocal folds. See details of ST above. Recommend course of ST for dysphonia to improve pt's QoL.   OBJECTIVE IMPAIRMENTS include voice disorder. These impairments are limiting patient from effectively communicating at home and in community. Factors affecting potential to achieve goals and functional outcome are co-morbidities. Patient will benefit from skilled SLP services to address above impairments and improve overall function.  REHAB POTENTIAL: Good  PLAN: SLP FREQUENCY: 1x/week  SLP DURATION: 12 weeks  PLANNED INTERVENTIONS: Environmental controls, Cueing hierachy, Functional tasks, SLP instruction and feedback, Compensatory strategies, and Patient/family education   Delon Bangs, M.S., CCC-SLP Speech-Language Pathologist [place] - [facility] [phone] [place])   [facility] Outpatient Rehabilitation at [facility] [address] [place], [place], [address] Phone: [phone]   Fax:  [phone]

## 2023-11-09 NOTE — Progress Notes (Signed)
 Subjective:    Patient ID: Debra Hale, female    DOB: 01-May-1951, 73 y.o.   MRN: [id number]  Patient here for  Chief Complaint  Patient presents with   Medical Management of Chronic Issues    3 month follow up    HPI Here for a scheduled follow up.  Recently diagnosed with parkinsons. On sinemet. Seeing neurology. Discussed f/u with neurology. Previously reported improvement in neuropathy with increase in nortriptyline . Referred to PT - for balance and gait. Recommended wrist brace for carpal tunnel.  Still with increased stress. Discussed. Overall she feels she is handling things relatively well. No chest pain. Breathing stable. No increased cough or congestion. Bowels stable. No increased diarrhea.    Past Medical History:  Diagnosis Date   Abdominal pain 03/06/2016   Acute pericarditis 05/31/2013   Anxiety    Arthritis    Osteoarthritis   Breast cancer (HCC) 2009   right breast lumpectomy with rad tx   Colon cancer (HCC)    surgery with chemo and rad tx   Complication of anesthesia    GERD (gastroesophageal reflux disease)    Heart palpitations    History of hiatal hernia    History of kidney stones    History of thyroid  cancer 09/18/2008   Qualifier: Diagnosis of   By: Wynetta, CMA, Carol       Hypothyroidism    Liver disease    Liver nodule    s/p negative biopsy   Malignant neoplasm of thyroid  gland (HCC) 2002   s/p surgery and XRT   Osteoporosis    Other and unspecified hyperlipidemia    Palpitations    Personal history of chemotherapy    Personal history of malignant neoplasm of large intestine    carcinoma - cecum, s/p right laparoscopic colectomy - s/p chemotherapy and XRT   Personal history of radiation therapy    Pneumonia 2019   PONV (postoperative nausea and vomiting)    Pure hypercholesterolemia    Unspecified hereditary and idiopathic peripheral neuropathy    Past Surgical History:  Procedure Laterality Date   APPENDECTOMY  1985   BREAST  BIOPSY Right 2009   positive   BREAST BIOPSY Right 2009   negative   BREAST LUMPECTOMY Right 2009   breast cancer   CHOLECYSTECTOMY  1995   COLONOSCOPY     COLONOSCOPY WITH PROPOFOL  N/A 10/29/2017   Procedure: COLONOSCOPY WITH PROPOFOL ;  Surgeon: Viktoria Lamar DASEN, MD;  Location: [facility] ENDOSCOPY;  Service: Endoscopy;  Laterality: N/A;   DILATION AND CURETTAGE OF UTERUS  1990   DILATION AND CURETTAGE, DIAGNOSTIC / THERAPEUTIC  1990   ESOPHAGOGASTRODUODENOSCOPY     ESOPHAGOGASTRODUODENOSCOPY (EGD) WITH PROPOFOL  N/A 10/29/2017   Procedure: ESOPHAGOGASTRODUODENOSCOPY (EGD) WITH PROPOFOL ;  Surgeon: Viktoria Lamar DASEN, MD;  Location: [facility] ENDOSCOPY;  Service: Endoscopy;  Laterality: N/A;   INCISION AND DRAINAGE PERIRECTAL ABSCESS N/A 06/17/2020   Procedure: IRRIGATION AND DEBRIDEMENT PERIRECTAL ABSCESS;  Surgeon: Jordis Laneta FALCON, MD;  Location: [facility] ORS;  Service: General;  Laterality: N/A;   IR BONE MARROW BIOPSY & ASPIRATION  07/09/2021   IR BONE MARROW BIOPSY & ASPIRATION  09/03/2023   LAPAROSCOPIC PARTIAL COLECTOMY     stage 3-C carcinoma of the cecum, s/p chemotherapy and xrt   LITHOTRIPSY     SIGMOIDOSCOPY  08/26/1993   THYROID  LOBECTOMY  2002   s/p XRT   TOTAL HIP ARTHROPLASTY Right 10/24/2019   Procedure: TOTAL HIP ARTHROPLASTY;  Surgeon: Edie Norleen PARAS, MD;  Location:  [facility] ORS;  Service: Orthopedics;  Laterality: Right;   Family History  Problem Relation Age of Onset   Stroke Mother        66s   Alzheimer's disease Mother    Lung cancer Father    Prostate cancer Father    Cancer Father        Colon   Colon cancer Father    Breast cancer Sister        46's   Lung cancer Sister    Breast cancer Maternal Aunt    Social History   Socioeconomic History   Marital status: Single    Spouse name: Not on file   Number of children: 0   Years of education: Not on file   Highest education level: Not on file  Occupational History   Occupation: Retired  Tobacco Use   Smoking status:  Never   Smokeless tobacco: Never  Vaping Use   Vaping status: Never Used  Substance and Sexual Activity   Alcohol use: No    Alcohol/week: 0.0 standard drinks of alcohol   Drug use: No   Sexual activity: Not Currently  Other Topics Concern   Not on file  Social History Narrative   Lives alone and occasionally has friend over.   Social Drivers of Corporate investment banker Strain: Low Risk  (09/18/2022)   Overall Financial Resource Strain (CARDIA)    Difficulty of Paying Living Expenses: Not hard at all  Food Insecurity: No Food Insecurity (09/18/2022)   Hunger Vital Sign    Worried About Running Out of Food in the Last Year: Never true    Ran Out of Food in the Last Year: Never true  Transportation Needs: No Transportation Needs (09/18/2022)   PRAPARE - Administrator, Civil Service (Medical): No    Lack of Transportation (Non-Medical): No  Physical Activity: Insufficiently Active (09/18/2022)   Exercise Vital Sign    Days of Exercise per Week: 1 day    Minutes of Exercise per Session: 10 min  Stress: Stress Concern Present (09/18/2022)   [employer] of Occupational Health - Occupational Stress Questionnaire    Feeling of Stress : To some extent  Social Connections: Moderately Isolated (09/18/2022)   Social Connection and Isolation Panel    Frequency of Communication with Friends and Family: More than three times a week    Frequency of Social Gatherings with Friends and Family: Twice a week    Attends Religious Services: 1 to 4 times per year    Active Member of [employer] or Organizations: No    Attends Banker Meetings: Never    Marital Status: Divorced     Review of Systems  Constitutional:  Negative for appetite change and unexpected weight change.  HENT:  Negative for congestion and sinus pressure.   Respiratory:  Negative for cough, chest tightness and shortness of breath.   Cardiovascular:  Negative for chest pain, palpitations and leg  swelling.  Gastrointestinal:  Negative for abdominal pain, diarrhea, nausea and vomiting.  Genitourinary:  Negative for difficulty urinating and dysuria.  Musculoskeletal:  Negative for joint swelling and myalgias.  Skin:  Negative for color change and rash.  Neurological:  Negative for dizziness and headaches.  Psychiatric/Behavioral:  Negative for agitation and dysphoric mood.        Increased stress.        Objective:     BP 104/68   Pulse 97   Temp 97.9 F (36.6 C)  Resp 20   Ht 5' 4 (1.626 m)   Wt 120 lb 2 oz (54.5 kg)   SpO2 99%   BMI 20.62 kg/m  Wt Readings from Last 3 Encounters:  11/09/23 120 lb 2 oz (54.5 kg)  09/21/23 123 lb (55.8 kg)  09/03/23 123 lb 9.6 oz (56.1 kg)    Physical Exam Vitals reviewed.  Constitutional:      General: She is not in acute distress.    Appearance: Normal appearance.  HENT:     Head: Normocephalic and atraumatic.     Right Ear: External ear normal.     Left Ear: External ear normal.     Mouth/Throat:     Pharynx: No oropharyngeal exudate or posterior oropharyngeal erythema.  Eyes:     General: No scleral icterus.       Right eye: No discharge.        Left eye: No discharge.     Conjunctiva/sclera: Conjunctivae normal.  Neck:     Thyroid : No thyromegaly.  Cardiovascular:     Rate and Rhythm: Normal rate and regular rhythm.  Pulmonary:     Effort: No respiratory distress.     Breath sounds: Normal breath sounds. No wheezing.  Abdominal:     General: Bowel sounds are normal.     Palpations: Abdomen is soft.     Tenderness: There is no abdominal tenderness.  Musculoskeletal:        General: No swelling or tenderness.     Cervical back: Neck supple. No tenderness.  Lymphadenopathy:     Cervical: No cervical adenopathy.  Skin:    Findings: No erythema or rash.  Neurological:     Mental Status: She is alert.  Psychiatric:        Mood and Affect: Mood normal.        Behavior: Behavior normal.          Outpatient Encounter Medications as of 11/09/2023  Medication Sig   Azelastine -Fluticasone  137-50 MCG/ACT SUSP SPRAY 2 SPRAYS INTO EACH NOSTRIL EVERY DAY   Carbidopa-Levodopa ER (SINEMET CR) 25-100 MG tablet controlled release Take 25/100 mg CR tablet nightly   clonazePAM  (KLONOPIN ) 0.5 MG tablet Take 1 tablet (0.5 mg total) by mouth 3 (three) times daily as needed for anxiety.   escitalopram  (LEXAPRO ) 20 MG tablet Take 20 mg by mouth every morning.    levothyroxine  (SYNTHROID ) 50 MCG tablet Take 1 tablet (50 mcg total) by mouth daily before breakfast.   nortriptyline  (PAMELOR ) 25 MG capsule Take 25 mg by mouth 2 (two) times daily.   olutasidenib  (REZLIDHIA ) 150 MG capsule Take 1 capsule (150 mg total) by mouth daily. Take on an empty stomach at least 1 hour before or 2 hours after a meal.   potassium chloride  (KLOR-CON  M) 10 MEQ tablet TAKE 1 TABLET BY MOUTH TWICE DAILY   prochlorperazine  (COMPAZINE ) 10 MG tablet TAKE ONE TABLET BY MOUTH EVERY 6 HOURS AS NEEDED FOR NAUSEA / VOMITING   rosuvastatin  (CRESTOR ) 5 MG tablet Take 1 tablet (5 mg total) by mouth daily.   [DISCONTINUED] pantoprazole  (PROTONIX ) 40 MG tablet Take 1 tablet (40 mg total) by mouth 2 (two) times daily before a meal.   [DISCONTINUED] valACYclovir  (VALTREX ) 500 MG tablet TAKE ONE TABLET BY MOUTH DAILY   No facility-administered encounter medications on file as of 11/09/2023.     Lab Results  Component Value Date   WBC 3.7 (L) 09/21/2023   HGB 11.0 (L) 09/21/2023   HCT 30.7 (L) 09/21/2023  PLT 125 (L) 09/21/2023   GLUCOSE 95 11/12/2023   CHOL 171 11/12/2023   TRIG 83 11/12/2023   HDL 61 11/12/2023   LDLDIRECT 126.0 07/11/2015   LDLCALC 95 11/12/2023   ALT 24 11/12/2023   AST 12 11/12/2023   NA 143 11/12/2023   K 3.8 11/12/2023   CL 105 11/12/2023   CREATININE 1.03 (H) 11/12/2023   BUN 15 11/12/2023   CO2 23 11/12/2023   TSH 5.010 (H) 11/12/2023   INR 1.3 (H) 04/04/2022   HGBA1C 5.3 12/10/2016    IR BONE  MARROW BIOPSY & ASPIRATION Addendum Date: 09/23/2023 ADDENDUM REPORT: 09/23/2023 09:19 FLUOROSCOPY: Reference air kerma: 1 mGy Electronically Signed   By: Ami Bellman D.O.   On: 09/23/2023 09:19   Result Date: 09/23/2023 INDICATION: 73 year old female with follow-up AML, referred for bone marrow biopsy EXAM: IR BONE MARRO BIOPY AND ASPIRATION MEDICATIONS: None. ANESTHESIA/SEDATION: Moderate (conscious) sedation was employed during this procedure. A total of Versed  1 mg and Fentanyl  50 mcg was administered intravenously. Moderate Sedation Time: 10 minutes. The patient's level of consciousness and vital signs were monitored continuously by radiology nursing throughout the procedure under my direct supervision. FLUOROSCOPY TIME:  Fluoroscopy Time:  (1 mGy). COMPLICATIONS: None immediate. PROCEDURE: Informed written consent was obtained from the patient after a thorough discussion of the procedural risks, benefits and alternatives. All questions were addressed. Maximal Sterile Barrier Technique was utilized including caps, mask, sterile gowns, sterile gloves, sterile drape, hand hygiene and skin antiseptic. A timeout was performed prior to the initiation of the procedure. The procedure risks, benefits, and alternatives were explained to the patient. Questions regarding the procedure were encouraged and answered. The patient understands and consents to the procedure. Patient was positioned prone under the image intensifier. Physical exam was used to determine the L5- S1 level, and then the posterior pelvis was prepped with Chlorhexidine  in a sterile fashion. A sterile drape was applied covering the operative field. Sterile gown/sterile gloves were used for the procedure. Local anesthesia was provided with 1% Lidocaine . Posterior left iliac bone was targeted for biopsy. The skin and subcutaneous tissues were infiltrated with 1% lidocaine  without epinephrine . A small stab incision was made with an 11 blade scalpel,  and an 11 gauge Murphy needle was advanced with fluoro guidance to the posterior cortex. Manual forced was used to advance the needle through the posterior cortex and the stylet was removed. A bone marrow aspirate was retrieved and passed to a cytotechnologist in the room. The Murphy needle was then advanced without the stylet for a core biopsy. The core biopsy was retrieved and also passed to a cytotechnologist. Manual pressure was used for hemostasis and a sterile dressing was placed. No complications were encountered no significant blood loss was encountered. Patient tolerated the procedure well and remained hemodynamically stable throughout. IMPRESSION: Status post image guided bone marrow biopsy. Signed, Ami RAMAN. Bellman ROSALEA GRAVER, RPVI [facility] Electronically Signed: By: Ami Bellman D.O. On: 09/03/2023 10:28       Assessment & Plan:  Acute myeloid leukemia not having achieved remission [facility]) Assessment & Plan: Seeing Dr Jacobo - f/u AML - on rezlidhia .  Following cbc.   Orders: -     Basic metabolic panel with GFR; Future -     Hepatic function panel; Future -     Lipid panel; Future  History of thyroid  cancer Assessment & Plan: Continue synthroid . Follow tsh.   Orders: -  TSH; Future  Hypomagnesemia  Osteoporosis, unspecified osteoporosis type, unspecified pathological fracture presence Assessment & Plan: Check vitamin D  level with labs.    Vitamin D  deficiency Assessment & Plan: Check vitamin D  level.   Orders: -     VITAMIN D  25 Hydroxy (Vit-D Deficiency, Fractures)  Anxiety Assessment & Plan: Continue f/u with Dr Claryce.  Overall appears to be doing some better. Continue current medication regimen.    Stress Assessment & Plan: Increased stress. Followed by Dr Claryce. Continues on lexapro  and clonazepam . Overall appears to be doing better. Continue f/u with Dr Claryce.    Pancytopenia  [facility]) Assessment & Plan: Hematology following.    Major depressive disorder, single episode, mild (HCC) Assessment & Plan: Continues on lexapro  and clonazepam . Followed by Dr Claryce. Appears to be doing better. Follow.    Hypothyroidism, unspecified type Assessment & Plan: Continues on synthroid . Follow tsh.    Hypercholesterolemia Assessment & Plan: Follow lipid panel.    History of malignant neoplasm of large intestine Assessment & Plan: Colonoscopy 10/2017.  Being followed by GI. Was due f/u 2020.  Had colonoscopy scheduled.  Canceled due to hip surgery.  Overdue f/u.  Currently being treated for AML.  No problems with her bowels currently. Will need to confirm f/u with GI.    History of breast cancer Assessment & Plan: Mammogram 07/29/21 - Birads II. Due f/u mammogram.  Discussed today. She has declined f/u mammogram.    Gastroesophageal reflux disease, unspecified whether esophagitis present Assessment & Plan: Continue protonix . No upper symptoms reported.    Diarrhea, unspecified type Assessment & Plan: Bowel improved.    Chronic diastolic CHF (congestive heart failure) (HCC) Assessment & Plan: 2D echo on 09/05/2020 showed EF 60-65% with grade 1 diastolic dysfunction. No evidence of volume overload on exam.  Breathing stable.    Chemotherapy-induced neuropathy (HCC) Assessment & Plan: Stable.       Allena Hamilton, MD

## 2023-11-10 ENCOUNTER — Other Ambulatory Visit: Payer: Self-pay | Admitting: Internal Medicine

## 2023-11-10 ENCOUNTER — Other Ambulatory Visit: Payer: Self-pay | Admitting: Pharmacist

## 2023-11-10 DIAGNOSIS — C9200 Acute myeloblastic leukemia, not having achieved remission: Secondary | ICD-10-CM

## 2023-11-10 LAB — VITAMIN D 25 HYDROXY (VIT D DEFICIENCY, FRACTURES): VITD: 14.42 ng/mL — ABNORMAL LOW (ref 30.00–100.00)

## 2023-11-11 ENCOUNTER — Telehealth: Payer: Self-pay

## 2023-11-11 ENCOUNTER — Ambulatory Visit: Payer: Self-pay | Admitting: Internal Medicine

## 2023-11-11 DIAGNOSIS — Z85850 Personal history of malignant neoplasm of thyroid: Secondary | ICD-10-CM

## 2023-11-11 MED ORDER — VITAMIN D (ERGOCALCIFEROL) 1.25 MG (50000 UNIT) PO CAPS: 50000.0000 [IU] | ORAL_CAPSULE | ORAL | 1 refills | Status: AC

## 2023-11-11 NOTE — Telephone Encounter (Signed)
 Hey,   Can pt's future labs (bmp, hepatic, lipidm tsh) be added to what was collected on 11/09/23?

## 2023-11-11 NOTE — Telephone Encounter (Signed)
 Rx ok'd for ergocalciferol  #12 with one refill.

## 2023-11-11 NOTE — Telephone Encounter (Signed)
 Noted, will get pt scheduled for lab appt

## 2023-11-11 NOTE — Telephone Encounter (Signed)
 Unable to reach pt. Pt needs lab appt. Labs already entered

## 2023-11-11 NOTE — Telephone Encounter (Signed)
 Please continue to try and contact. Will need another lab appt.  I appologize for the inconvenience.

## 2023-11-12 ENCOUNTER — Other Ambulatory Visit (INDEPENDENT_AMBULATORY_CARE_PROVIDER_SITE_OTHER)

## 2023-11-12 DIAGNOSIS — C9200 Acute myeloblastic leukemia, not having achieved remission: Secondary | ICD-10-CM | POA: Diagnosis not present

## 2023-11-12 DIAGNOSIS — Z85850 Personal history of malignant neoplasm of thyroid: Secondary | ICD-10-CM | POA: Diagnosis not present

## 2023-11-12 NOTE — Telephone Encounter (Signed)
 Called and notified pt. Appt scheduled for today at 2:15pm

## 2023-11-13 LAB — BASIC METABOLIC PANEL WITH GFR
BUN/Creatinine Ratio: 15 (ref 12–28)
BUN: 15 mg/dL (ref 8–27)
CO2: 23 mmol/L (ref 20–29)
Calcium: 8.9 mg/dL (ref 8.7–10.3)
Chloride: 105 mmol/L (ref 96–106)
Creatinine, Ser: 1.03 mg/dL — ABNORMAL HIGH (ref 0.57–1.00)
Glucose: 95 mg/dL (ref 70–99)
Potassium: 3.8 mmol/L (ref 3.5–5.2)
Sodium: 143 mmol/L (ref 134–144)
eGFR: 57 mL/min/1.73 — ABNORMAL LOW (ref >59)

## 2023-11-13 LAB — LIPID PANEL
Chol/HDL Ratio: 2.8 ratio (ref 0.0–4.4)
Cholesterol, Total: 171 mg/dL (ref 100–199)
HDL: 61 mg/dL (ref >39)
LDL Chol Calc (NIH): 95 mg/dL (ref 0–99)
Triglycerides: 83 mg/dL (ref 0–149)
VLDL Cholesterol Cal: 15 mg/dL (ref 5–40)

## 2023-11-13 LAB — HEPATIC FUNCTION PANEL
ALT: 24 IU/L (ref 0–32)
AST: 12 IU/L (ref 0–40)
Albumin: 4.3 g/dL (ref 3.8–4.8)
Alkaline Phosphatase: 70 IU/L (ref 44–121)
Bilirubin Total: 0.4 mg/dL (ref 0.0–1.2)
Bilirubin, Direct: 0.14 mg/dL (ref 0.00–0.40)
Total Protein: 6.2 g/dL (ref 6.0–8.5)

## 2023-11-13 LAB — TSH: TSH: 5.01 u[IU]/mL — ABNORMAL HIGH (ref 0.450–4.500)

## 2023-11-14 ENCOUNTER — Encounter: Payer: Self-pay | Admitting: Internal Medicine

## 2023-11-14 NOTE — Assessment & Plan Note (Signed)
Hematology following.

## 2023-11-14 NOTE — Assessment & Plan Note (Signed)
 Continues on synthroid. Follow tsh.

## 2023-11-14 NOTE — Assessment & Plan Note (Signed)
 Mammogram 07/29/21 - Birads II. Due f/u mammogram.  Discussed today. She has declined f/u mammogram.

## 2023-11-14 NOTE — Assessment & Plan Note (Signed)
 Continue synthroid. Follow tsh.

## 2023-11-14 NOTE — Assessment & Plan Note (Signed)
 Continues on lexapro  and clonazepam . Followed by Dr Claryce. Appears to be doing better. Follow.

## 2023-11-14 NOTE — Assessment & Plan Note (Signed)
 Colonoscopy 10/2017.  Being followed by GI. Was due f/u 2020.  Had colonoscopy scheduled.  Canceled due to hip surgery.  Overdue f/u.  Currently being treated for AML.  No problems with her bowels currently. Will need to confirm f/u with GI.

## 2023-11-14 NOTE — Assessment & Plan Note (Signed)
 Continue f/u with Dr Claryce.  Overall appears to be doing some better. Continue current medication regimen.

## 2023-11-14 NOTE — Assessment & Plan Note (Signed)
 Bowel improved.

## 2023-11-14 NOTE — Assessment & Plan Note (Signed)
 2D echo on 09/05/2020 showed EF 60-65% with grade 1 diastolic dysfunction. No evidence of volume overload on exam.  Breathing stable.

## 2023-11-14 NOTE — Assessment & Plan Note (Signed)
 Check vitamin D level with labs.

## 2023-11-14 NOTE — Assessment & Plan Note (Signed)
 Check vitamin D level

## 2023-11-14 NOTE — Assessment & Plan Note (Signed)
 Follow lipid panel.

## 2023-11-14 NOTE — Assessment & Plan Note (Signed)
Continue protonix.  No upper symptoms reported.

## 2023-11-14 NOTE — Assessment & Plan Note (Signed)
 Seeing Dr Adrian Alba - f/u AML - on rezlidhia.  Following cbc.

## 2023-11-14 NOTE — Assessment & Plan Note (Signed)
 Stable

## 2023-11-14 NOTE — Assessment & Plan Note (Signed)
 Increased stress. Followed by Dr Claryce. Continues on lexapro  and clonazepam . Overall appears to be doing better. Continue f/u with Dr Claryce.

## 2023-11-15 ENCOUNTER — Ambulatory Visit

## 2023-11-15 DIAGNOSIS — R490 Dysphonia: Secondary | ICD-10-CM | POA: Diagnosis not present

## 2023-11-15 NOTE — Therapy (Signed)
 OUTPATIENT SPEECH LANGUAGE PATHOLOGY  VOICE TREATMENT   Patient Name: Debra Hale MRN: [id number] DOB:Sep 28, 1950, 73 y.o., female Today's Date: 11/15/2023  PCP: Allena Hamilton, MD REFERRING PROVIDER: Chinita Hasten, MD   End of Session - 11/15/23 1354     Visit Number 7    Number of Visits 12    Date for SLP Re-Evaluation 12/15/23    SLP Start Time 1400    SLP Stop Time  1445    SLP Time Calculation (min) 45 min    Activity Tolerance Patient tolerated treatment well          Past Medical History:  Diagnosis Date   Abdominal pain 03/06/2016   Acute pericarditis 05/31/2013   Anxiety    Arthritis    Osteoarthritis   Breast cancer (HCC) 2009   right breast lumpectomy with rad tx   Colon cancer (HCC)    surgery with chemo and rad tx   Complication of anesthesia    GERD (gastroesophageal reflux disease)    Heart palpitations    History of hiatal hernia    History of kidney stones    History of thyroid  cancer 09/18/2008   Qualifier: Diagnosis of   By: Wynetta, CMA, Carol       Hypothyroidism    Liver disease    Liver nodule    s/p negative biopsy   Malignant neoplasm of thyroid  gland (HCC) 2002   s/p surgery and XRT   Osteoporosis    Other and unspecified hyperlipidemia    Palpitations    Personal history of chemotherapy    Personal history of malignant neoplasm of large intestine    carcinoma - cecum, s/p right laparoscopic colectomy - s/p chemotherapy and XRT   Personal history of radiation therapy    Pneumonia 2019   PONV (postoperative nausea and vomiting)    Pure hypercholesterolemia    Unspecified hereditary and idiopathic peripheral neuropathy    Past Surgical History:  Procedure Laterality Date   APPENDECTOMY  1985   BREAST BIOPSY Right 2009   positive   BREAST BIOPSY Right 2009   negative   BREAST LUMPECTOMY Right 2009   breast cancer   CHOLECYSTECTOMY  1995   COLONOSCOPY     COLONOSCOPY WITH PROPOFOL  N/A 10/29/2017   Procedure: COLONOSCOPY  WITH PROPOFOL ;  Surgeon: Viktoria Lamar DASEN, MD;  Location: [facility] ENDOSCOPY;  Service: Endoscopy;  Laterality: N/A;   DILATION AND CURETTAGE OF UTERUS  1990   DILATION AND CURETTAGE, DIAGNOSTIC / THERAPEUTIC  1990   ESOPHAGOGASTRODUODENOSCOPY     ESOPHAGOGASTRODUODENOSCOPY (EGD) WITH PROPOFOL  N/A 10/29/2017   Procedure: ESOPHAGOGASTRODUODENOSCOPY (EGD) WITH PROPOFOL ;  Surgeon: Viktoria Lamar DASEN, MD;  Location: [facility] ENDOSCOPY;  Service: Endoscopy;  Laterality: N/A;   INCISION AND DRAINAGE PERIRECTAL ABSCESS N/A 06/17/2020   Procedure: IRRIGATION AND DEBRIDEMENT PERIRECTAL ABSCESS;  Surgeon: Jordis Laneta FALCON, MD;  Location: [facility] ORS;  Service: General;  Laterality: N/A;   IR BONE MARROW BIOPSY & ASPIRATION  07/09/2021   IR BONE MARROW BIOPSY & ASPIRATION  09/03/2023   LAPAROSCOPIC PARTIAL COLECTOMY     stage 3-C carcinoma of the cecum, s/p chemotherapy and xrt   LITHOTRIPSY     SIGMOIDOSCOPY  08/26/1993   THYROID  LOBECTOMY  2002   s/p XRT   TOTAL HIP ARTHROPLASTY Right 10/24/2019   Procedure: TOTAL HIP ARTHROPLASTY;  Surgeon: Edie Norleen PARAS, MD;  Location: [facility] ORS;  Service: Orthopedics;  Laterality: Right;   Patient Active Problem List   Diagnosis Date Noted  Hypomagnesemia 08/10/2023   Hoarseness 03/28/2023   Tremor 02/14/2023   Depression with anxiety 04/04/2022   Hypokalemia 04/04/2022   Diarrhea 04/04/2022   Chronic diastolic CHF (congestive heart failure) (HCC) 04/04/2022   Hypophosphatemia 04/04/2022   Electrolyte abnormality 03/31/2022   Hydronephrosis of right kidney 03/31/2022   Vaginal dryness 01/05/2022   Liver nodule 11/18/2021   Major depressive disorder, single episode, mild (HCC) 06/01/2021   Fatty liver disease, nonalcoholic 04/18/2020   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   Pancytopenia (HCC) 02/11/2020   Status post total hip replacement, 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   Lumbar spondylosis 08/25/2019   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/10/2019   Leukopenia  08/21/2018   Change in vision 06/24/2018   Chronic daily headache 05/31/2018   Hypothyroidism 05/02/2018   Vitamin D  deficiency 07/27/2015   Loose stools 04/14/2015   Health care maintenance 06/17/2014   Flatulence, eructation and gas pain 06/17/2014   Stress 01/28/2014   Abnormal respiratory rate 06/19/2013   Environmental allergies 02/25/2013   Abnormal liver function test 12/07/2012   Anxiety 05/06/2012   GERD (gastroesophageal reflux disease) 05/06/2012   Osteopenia 05/06/2012   Hematuria 05/06/2012   Osteoporosis 05/06/2012   History of breast cancer 09/18/2008   History of thyroid  cancer 09/18/2008   Hypercholesterolemia 09/18/2008   Chemotherapy-induced neuropathy (HCC) 09/18/2008   Palpitations 09/18/2008   History of malignant neoplasm of large intestine 09/18/2008   Malignant neoplasm of breast (female) (HCC) 09/18/2008   Malignant neoplasm of thyroid  gland (HCC) 09/18/2008   Hereditary and idiopathic peripheral neuropathy 09/18/2008    ONSET DATE:  ~3 months ago  REFERRING DIAG: Dysphonia, Hoarseness  THERAPY DIAG:  Dysphonia  Rationale for Evaluation and Treatment Rehabilitation  SUBJECTIVE:   SUBJECTIVE STATEMENT: Pt alert and cooperative. Pt accompanied by: self  PERTINENT HISTORY: Reports worsening hoarseness for ~3 months  DIAGNOSTIC FINDINGS: Laryngoscopy 09/2022 mild bowing of vocal folds  PAIN:  Are you having pain? No   FALLS: Has patient fallen in last 6 months? No, Number of falls: 0  LIVING ENVIRONMENT: Lives with: lives alone Lives in: House/apartment  PLOF: Independent  PATIENT GOALS    for voice to improve  OBJECTIVE:   TODAY'S TREATMENT:  Reviewed HEP for dysphonia. Pt completed with use of biofeedback/Voice Analyst app averaging 84-85dB for sustained phonation, ascending and descending pitch glides, and phrases. Pt completed each ex x10.  Education completed via rationale as well as changes to speech in setting of  Parkinsonism as well as bowed vocal cords.  Continued conversation training therapy. Pt able to hear difference between clear speech and dysphonia. Encouraged to utilize clear speech (slow, loud) with appreciable difference in vocal quality. Pt able to adjust voicing with rare-min cueing during 15 minute speech sample.  Education also completed re: substitute behaviors for throat clearing, general vocal hygiene, and head/neck stretches to reduce tension.      PATIENT EDUCATION: Education details: as above Person educated: Patient Education method: Programmer, multimedia; Handout provided Education comprehension: verbalized understanding, returned demonstration, and needs further education   HOME EXERCISE PROGRAM: Practice clear speech therapy and voice ex's as above     GOALS: Goals reviewed with patient? Yes  SHORT TERM GOALS: Target date: 10 sessions  Pt willverbalize awareness of phonotraumatic behaviors, such as chronic throat clearing, and verbalize/demo non-phonotraumatic alternative to throat clearing. Baseline: Goal status: INITIAL  2.  Pt will be independent in HEP for voice. Baseline:  Goal status: INITIAL  3.  Pt will participate in 5-8 minutes conversation,  maintaining average loudness of 75 dB and loud, good quality voice with min cues.     Baseline:  Goal status: INITIAL  4. Pt will demonstrate independent understanding of vocal hygiene concepts.   Baseline:  Goal status: INITIAL    LONG TERM GOALS: Target date: 12 weeks  Pt will improve score on the VHI by 10% indicating improved perception of speech abilities.  Baseline:  Goal status: INITIAL  2.  The patient will participate in 15-20 minutes conversation, maintaining average loudness of 75 dB and loud, good quality voice.    Baseline: 64/120 Goal status: INITIAL   ASSESSMENT:  CLINICAL IMPRESSION: Patient is a 73 y.o. female who was seen today for voice treatment due to complaints of hoarse, raspy, and  weak vocal quality. On initial eval, with c/o vocal fatigue stating, my voice gives out at the end of the day. Pt currently undergoing chemotx for AML and is reported suboptimal water intake. Pt with hx of Parkinson's Disease, GERD, anxiety, thyroid  cancer, colon cancer, and breast cancer. Based on VHI, pt's dysphonia is severe and with impacts on daily and social communication. Perceptual features of dysphonia observed on evaluation include hoarseness, strain, and fry. When cued to use clear speech therapy, notable subjective and auditory-perceptual improvement in hoarseness observed. Pt with habitual throat clearing observed. Flexible laryngoscopic evaluation completed 09/15/23 was unremarkable except mild bowing of vocal folds. See details of ST above. Recommend course of ST for dysphonia to improve pt's QoL.   OBJECTIVE IMPAIRMENTS include voice disorder. These impairments are limiting patient from effectively communicating at home and in community. Factors affecting potential to achieve goals and functional outcome are co-morbidities. Patient will benefit from skilled SLP services to address above impairments and improve overall function.  REHAB POTENTIAL: Good  PLAN: SLP FREQUENCY: 1x/week  SLP DURATION: 12 weeks  PLANNED INTERVENTIONS: Environmental controls, Cueing hierachy, Functional tasks, SLP instruction and feedback, Compensatory strategies, and Patient/family education   Delon Bangs, M.S., CCC-SLP Speech-Language Pathologist [facility] [phone] FAYETTE)   [facility] at [facility] [address] [place], [place], [address] Phone: [phone]   Fax:  [phone]

## 2023-11-15 NOTE — Addendum Note (Signed)
 Addended by: Sundeep Cary on: 11/15/2023 02:59 PM   Modules accepted: Orders

## 2023-11-16 ENCOUNTER — Other Ambulatory Visit: Payer: Self-pay | Admitting: Internal Medicine

## 2023-11-16 ENCOUNTER — Ambulatory Visit

## 2023-11-17 ENCOUNTER — Telehealth: Payer: Self-pay

## 2023-11-17 DIAGNOSIS — G20A1 Parkinson's disease without dyskinesia, without mention of fluctuations: Secondary | ICD-10-CM | POA: Diagnosis not present

## 2023-11-17 DIAGNOSIS — R258 Other abnormal involuntary movements: Secondary | ICD-10-CM | POA: Diagnosis not present

## 2023-11-17 DIAGNOSIS — Z1331 Encounter for screening for depression: Secondary | ICD-10-CM | POA: Diagnosis not present

## 2023-11-17 DIAGNOSIS — R498 Other voice and resonance disorders: Secondary | ICD-10-CM | POA: Diagnosis not present

## 2023-11-17 DIAGNOSIS — R2689 Other abnormalities of gait and mobility: Secondary | ICD-10-CM | POA: Diagnosis not present

## 2023-11-17 DIAGNOSIS — R490 Dysphonia: Secondary | ICD-10-CM | POA: Diagnosis not present

## 2023-11-17 DIAGNOSIS — G5603 Carpal tunnel syndrome, bilateral upper limbs: Secondary | ICD-10-CM | POA: Diagnosis not present

## 2023-11-17 DIAGNOSIS — R1310 Dysphagia, unspecified: Secondary | ICD-10-CM | POA: Diagnosis not present

## 2023-11-17 DIAGNOSIS — R531 Weakness: Secondary | ICD-10-CM | POA: Diagnosis not present

## 2023-11-17 MED ORDER — LEVOTHYROXINE SODIUM 75 MCG PO TABS: 75.0000 ug | ORAL_TABLET | Freq: Every day | ORAL | 0 refills | Status: DC

## 2023-11-17 NOTE — Addendum Note (Signed)
 Addended by: MARYLYNN VERNEITA CROME on: 11/17/2023 12:20 PM   Modules accepted: Orders

## 2023-11-17 NOTE — Telephone Encounter (Signed)
 Copied from CRM [id number]. Topic: Clinical - Medication Question >> Nov 17, 2023  8:55 AM Berneda FALCON wrote: Reason for CRM: Pt states she was told that this medication was supposed to be changed to 88MG  but she is still showing 50 MG. She wants to know if she should still be taking 50MG  or if she is going to call in the 88MG   levothyroxine  (SYNTHROID ) 50 MCG tablet  [facility] - [place], [place] - [address] [place] [address] Phone: [phone] Fax: [phone] Hours: Not open 24 hours  Patient callback is [phone]

## 2023-11-17 NOTE — Telephone Encounter (Signed)
 Called to informed pt that medication has been sent to pharmacy, but vm was full unable to leave a message to give the office a call back.

## 2023-11-19 NOTE — Telephone Encounter (Signed)
 Called pt and she stated that she has picked up the correct dose.

## 2023-11-22 NOTE — Telephone Encounter (Unsigned)
 Copied from CRM [id number]. Topic: Clinical - Lab/Test Results >> Nov 22, 2023 10:42 AM Elle L wrote: Reason for CRM: The patient called back regarding her lab results. I read the note verbatim and she expressed understanding. She is agreeable to changing to synthroid  q day and we scheduled her lab appointment to have in 6 weeks.

## 2023-11-23 ENCOUNTER — Inpatient Hospital Stay: Admitting: Pharmacist

## 2023-11-23 ENCOUNTER — Inpatient Hospital Stay: Attending: Oncology

## 2023-11-23 VITALS — BP 124/71 | HR 94 | Temp 98.0°F | Resp 16 | Wt 121.9 lb

## 2023-11-23 DIAGNOSIS — Z79899 Other long term (current) drug therapy: Secondary | ICD-10-CM | POA: Insufficient documentation

## 2023-11-23 DIAGNOSIS — C9200 Acute myeloblastic leukemia, not having achieved remission: Secondary | ICD-10-CM

## 2023-11-23 LAB — COMPREHENSIVE METABOLIC PANEL WITH GFR
ALT: 33 U/L (ref 0–44)
AST: 14 U/L — ABNORMAL LOW (ref 15–41)
Albumin: 4.1 g/dL (ref 3.5–5.0)
Alkaline Phosphatase: 63 U/L (ref 38–126)
Anion gap: 8 (ref 5–15)
BUN: 19 mg/dL (ref 8–23)
CO2: 26 mmol/L (ref 22–32)
Calcium: 8.7 mg/dL — ABNORMAL LOW (ref 8.9–10.3)
Chloride: 107 mmol/L (ref 98–111)
Creatinine, Ser: 1 mg/dL (ref 0.44–1.00)
GFR, Estimated: 59 mL/min — ABNORMAL LOW (ref >60)
Glucose, Bld: 115 mg/dL — ABNORMAL HIGH (ref 70–99)
Potassium: 3.7 mmol/L (ref 3.5–5.1)
Sodium: 141 mmol/L (ref 135–145)
Total Bilirubin: 0.9 mg/dL (ref 0.0–1.2)
Total Protein: 6.4 g/dL — ABNORMAL LOW (ref 6.5–8.1)

## 2023-11-23 LAB — CBC WITH DIFFERENTIAL/PLATELET
Abs Immature Granulocytes: 0 K/uL (ref 0.00–0.07)
Basophils Absolute: 0 K/uL (ref 0.0–0.1)
Basophils Relative: 0 %
Eosinophils Absolute: 0.1 K/uL (ref 0.0–0.5)
Eosinophils Relative: 3 %
HCT: 31.1 % — ABNORMAL LOW (ref 36.0–46.0)
Hemoglobin: 11 g/dL — ABNORMAL LOW (ref 12.0–15.0)
Immature Granulocytes: 0 %
Lymphocytes Relative: 23 %
Lymphs Abs: 0.8 K/uL (ref 0.7–4.0)
MCH: 36.4 pg — ABNORMAL HIGH (ref 26.0–34.0)
MCHC: 35.4 g/dL (ref 30.0–36.0)
MCV: 103 fL — ABNORMAL HIGH (ref 80.0–100.0)
Monocytes Absolute: 0.2 K/uL (ref 0.1–1.0)
Monocytes Relative: 6 %
Neutro Abs: 2.4 K/uL (ref 1.7–7.7)
Neutrophils Relative %: 68 %
Platelets: 102 K/uL — ABNORMAL LOW (ref 150–400)
RBC: 3.02 MIL/uL — ABNORMAL LOW (ref 3.87–5.11)
RDW: 15.6 % — ABNORMAL HIGH (ref 11.5–15.5)
WBC: 3.5 K/uL — ABNORMAL LOW (ref 4.0–10.5)
nRBC: 0 % (ref 0.0–0.2)

## 2023-11-23 NOTE — Progress Notes (Signed)
 Oral Chemotherapy Clinic [facility]  [phone] [phone]  Patient Care Team: Glendia Shad, MD as PCP - General (Internal Medicine) Darron Deatrice LABOR, MD as PCP - Cardiology (Cardiology) Jacobo Evalene PARAS, MD as Consulting Physician (Oncology)   Name of the patient: Debra Hale  [id number]  August 23, 1950   Encounter Date 11/23/2023  HPI: Patient is a 73 y.o. female with AML. Previously treated with azacitadine and venetoclax . A repeat BM biopsy showed disease progression. 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 She was dose reduced to olutasidenib  100mg  daily on 06/22/22 due to decreased pltc.   Reason for Consult: Oral chemotherapy follow-up for Rezlidhia  (olutasidenib ) therapy.   PAST MEDICAL HISTORY: Past Medical History:  Diagnosis Date   Abdominal pain 03/06/2016   Acute pericarditis 05/31/2013   Anxiety    Arthritis    Osteoarthritis   Breast cancer (HCC) 2009   right breast lumpectomy with rad tx   Colon cancer (HCC)    surgery with chemo and rad tx   Complication of anesthesia    GERD (gastroesophageal reflux disease)    Heart palpitations    History of hiatal hernia    History of kidney stones    History of thyroid  cancer 09/18/2008   Qualifier: Diagnosis of   By: Wynetta CMA, Carol       Hypothyroidism    Liver disease    Liver nodule    s/p negative biopsy   Malignant neoplasm of thyroid  gland (HCC) 2002   s/p surgery and XRT   Osteoporosis    Other and unspecified hyperlipidemia    Palpitations    Personal history of chemotherapy    Personal history of malignant neoplasm of large intestine    carcinoma - cecum, s/p right laparoscopic colectomy - s/p chemotherapy and XRT   Personal history of radiation therapy    Pneumonia 2019   PONV (postoperative nausea and vomiting)    Pure hypercholesterolemia    Unspecified hereditary and idiopathic peripheral  neuropathy     HEMATOLOGY/ONCOLOGY HISTORY:  Oncology History  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 Initial Diagnosis   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HCC)   06/10/2020 - 12/19/2021 Chemotherapy   Patient is on Treatment Plan : AML dose reduced azacitidine  SQ D1-5 q28d     06/10/2020 - 02/13/2022 Chemotherapy   Patient is on Treatment Plan : AML Azacitidine  SQ D1-5 q28d       ALLERGIES:  is allergic to demeclocycline, sulfa  antibiotics, tetracyclines & related, chlordiazepoxide, dicyclomine, levofloxacin , augmentin  [amoxicillin -pot clavulanate], bentyl [dicyclomine hcl], ciprofloxacin , codeine, dicyclomine hcl, epinephrine , flagyl  [metronidazole ], librax [chlordiazepoxide-clidinium], novocain [procaine], phenobarbital, prednisone , and tramadol.  MEDICATIONS:  Current Outpatient Medications  Medication Sig Dispense Refill   Azelastine -Fluticasone  137-50 MCG/ACT SUSP SPRAY 2 SPRAYS INTO EACH NOSTRIL EVERY DAY 23 g 2   Carbidopa-Levodopa ER (SINEMET CR) 25-100 MG tablet controlled release Take 25/100 mg CR tablet nightly     clonazePAM  (KLONOPIN ) 0.5 MG tablet Take 1 tablet (0.5 mg total) by mouth 3 (three) times daily as needed for anxiety. 30 tablet 0   escitalopram  (LEXAPRO ) 20 MG tablet Take 20 mg by mouth every morning.      levothyroxine  (SYNTHROID ) 75 MCG tablet Take 1 tablet (75 mcg total) by mouth daily. 90 tablet 0   nortriptyline  (PAMELOR ) 25 MG capsule Take 25 mg by mouth 2 (two) times daily.     olutasidenib  (REZLIDHIA ) 150 MG capsule Take 1 capsule (150 mg total)  by mouth daily. Take on an empty stomach at least 1 hour before or 2 hours after a meal. 30 capsule 2   pantoprazole  (PROTONIX ) 40 MG tablet TAKE ONE TABLET BY MOUTH TWICE DAILY BEFORE A MEAL 180 tablet 1   potassium chloride  (KLOR-CON  M) 10 MEQ tablet TAKE 1 TABLET BY MOUTH TWICE DAILY 60 tablet 2   prochlorperazine  (COMPAZINE ) 10 MG tablet TAKE ONE TABLET BY MOUTH EVERY 6 HOURS AS NEEDED FOR NAUSEA /  VOMITING 30 tablet 3   rosuvastatin  (CRESTOR ) 5 MG tablet Take 1 tablet (5 mg total) by mouth daily. 30 tablet 2   valACYclovir  (VALTREX ) 500 MG tablet TAKE 1 TABLET BY MOUTH DAILY 30 tablet 2   Vitamin D , Ergocalciferol , (DRISDOL ) 1.25 MG (50000 UNIT) CAPS capsule Take 1 capsule (50,000 Units total) by mouth every 7 (seven) days. 12 capsule 1   No current facility-administered medications for this visit.    VITAL SIGNS: BP 124/71 (BP Location: Right Arm, Patient Position: Sitting)   Pulse 94   Temp 98 F (36.7 C) (Tympanic)   Resp 16   Wt 55.3 kg (121 lb 14.4 oz)   BMI 20.92 kg/m  Filed Weights   11/23/23 1403  Weight: 55.3 kg (121 lb 14.4 oz)       Estimated body mass index is 20.92 kg/m as calculated from the following:   Height as of 11/09/23: 5' 4 (1.626 m).   Weight as of this encounter: 55.3 kg (121 lb 14.4 oz).  LABS: CBC:    Component Value Date/Time   WBC 3.7 (L) 09/21/2023 1423   HGB 11.0 (L) 09/21/2023 1423   HGB 12.5 05/09/2018 1147   HCT 30.7 (L) 09/21/2023 1423   HCT 36.8 05/09/2018 1147   PLT 125 (L) 09/21/2023 1423   PLT 285 05/09/2018 1147   MCV 103.4 (H) 09/21/2023 1423   MCV 86 05/09/2018 1147   MCV 89 11/30/2013 1419   NEUTROABS 2.4 09/21/2023 1423   NEUTROABS 2.9 11/20/2013 1404   LYMPHSABS 0.7 09/21/2023 1423   LYMPHSABS 0.9 (L) 11/20/2013 1404   MONOABS 0.2 09/21/2023 1423   MONOABS 0.2 11/20/2013 1404   EOSABS 0.1 09/21/2023 1423   EOSABS 0.1 11/20/2013 1404   BASOSABS 0.0 09/21/2023 1423   BASOSABS 0.0 11/20/2013 1404   Comprehensive Metabolic Panel:    Component Value Date/Time   NA 143 11/12/2023 1416   NA 145 11/30/2013 1419   K 3.8 11/12/2023 1416   K 3.5 11/30/2013 1419   CL 105 11/12/2023 1416   CL 109 (H) 11/30/2013 1419   CO2 23 11/12/2023 1416   CO2 29 11/30/2013 1419   BUN 15 11/12/2023 1416   BUN 15 11/30/2013 1419   CREATININE 1.03 (H) 11/12/2023 1416   CREATININE 1.04 11/30/2013 1419   GLUCOSE 95 11/12/2023  1416   GLUCOSE 134 (H) 09/21/2023 1423   GLUCOSE 97 11/30/2013 1419   CALCIUM  8.9 11/12/2023 1416   CALCIUM  8.7 11/30/2013 1419   AST 12 11/12/2023 1416   AST 28 11/30/2013 1419   ALT 24 11/12/2023 1416   ALT 71 (H) 11/30/2013 1419   ALKPHOS 70 11/12/2023 1416   ALKPHOS 114 11/30/2013 1419   BILITOT 0.4 11/12/2023 1416   BILITOT 0.7 11/30/2013 1419   PROT 6.2 11/12/2023 1416   PROT 7.1 11/30/2013 1419   ALBUMIN 4.3 11/12/2023 1416   ALBUMIN 3.3 (L) 11/30/2013 1419     Present during today's visit: patient only  Assessment and Plan: CMP/CBC reviewed, continue  reduced dose olutasidenib  150mg  daily.  Patient continues to tolerate olutasidenib , no reported side effects   Oral Chemotherapy Adherence: No missed doses reported There are no patient barriers to medication adherence identified.   New medications: None reported   Medication Access Issues: No issues, patient fills at [facility].   Patient expressed understanding and was in agreement with this plan. She also understands that She can call clinic at any time with any questions, concerns, or complaints.   Follow-up plan: RTC in 2 months as scheduled  Thank you for allowing me to participate in the care of this very pleasant patient.   Time Total: 15 minutes  Visit consisted of counseling and education on dealing with issues of symptom management in the setting of serious and potentially life-threatening illness.Greater than 50% of this time was spent counseling and coordinating care related to the above assessment and plan.  Tammi Boulier N. Luva Metzger, PharmD, BCOP, CPP Hematology/Oncology Clinical Pharmacist Practitioner [facility] [phone]  11/23/2023 2:08 PM

## 2023-11-25 ENCOUNTER — Other Ambulatory Visit: Payer: Self-pay

## 2023-11-29 ENCOUNTER — Other Ambulatory Visit (HOSPITAL_COMMUNITY): Payer: Self-pay

## 2023-11-30 ENCOUNTER — Ambulatory Visit

## 2023-11-30 DIAGNOSIS — R490 Dysphonia: Secondary | ICD-10-CM

## 2023-11-30 NOTE — Therapy (Signed)
 OUTPATIENT SPEECH LANGUAGE PATHOLOGY  VOICE TREATMENT   Patient Name: Debra Hale MRN: [id number] DOB:[birth year], 73 y.o., female Today's Date: 11/30/2023  PCP: Allena Hamilton, MD REFERRING PROVIDER: Chinita Hasten, MD   End of Session - 11/30/23 1312     Visit Number 8    Number of Visits 12    Date for SLP Re-Evaluation 12/15/23    SLP Start Time 1200    SLP Stop Time  1245    SLP Time Calculation (min) 45 min    Activity Tolerance Patient tolerated treatment well          Past Medical History:  Diagnosis Date   Abdominal pain 03/06/2016   Acute pericarditis 05/31/2013   Anxiety    Arthritis    Osteoarthritis   Breast cancer (HCC) 2009   right breast lumpectomy with rad tx   Colon cancer (HCC)    surgery with chemo and rad tx   Complication of anesthesia    GERD (gastroesophageal reflux disease)    Heart palpitations    History of hiatal hernia    History of kidney stones    History of thyroid  cancer 09/18/2008   Qualifier: Diagnosis of   By: Wynetta, CMA, Carol       Hypothyroidism    Liver disease    Liver nodule    s/p negative biopsy   Malignant neoplasm of thyroid  gland (HCC) 2002   s/p surgery and XRT   Osteoporosis    Other and unspecified hyperlipidemia    Palpitations    Personal history of chemotherapy    Personal history of malignant neoplasm of large intestine    carcinoma - cecum, s/p right laparoscopic colectomy - s/p chemotherapy and XRT   Personal history of radiation therapy    Pneumonia 2019   PONV (postoperative nausea and vomiting)    Pure hypercholesterolemia    Unspecified hereditary and idiopathic peripheral neuropathy    Past Surgical History:  Procedure Laterality Date   APPENDECTOMY  1985   BREAST BIOPSY Right 2009   positive   BREAST BIOPSY Right 2009   negative   BREAST LUMPECTOMY Right 2009   breast cancer   CHOLECYSTECTOMY  1995   COLONOSCOPY     COLONOSCOPY WITH PROPOFOL  N/A 10/29/2017   Procedure: COLONOSCOPY  WITH PROPOFOL ;  Surgeon: Viktoria Lamar DASEN, MD;  Location: [facility] ENDOSCOPY;  Service: Endoscopy;  Laterality: N/A;   DILATION AND CURETTAGE OF UTERUS  1990   DILATION AND CURETTAGE, DIAGNOSTIC / THERAPEUTIC  1990   ESOPHAGOGASTRODUODENOSCOPY     ESOPHAGOGASTRODUODENOSCOPY (EGD) WITH PROPOFOL  N/A 10/29/2017   Procedure: ESOPHAGOGASTRODUODENOSCOPY (EGD) WITH PROPOFOL ;  Surgeon: Viktoria Lamar DASEN, MD;  Location: [facility] ENDOSCOPY;  Service: Endoscopy;  Laterality: N/A;   INCISION AND DRAINAGE PERIRECTAL ABSCESS N/A 06/17/2020   Procedure: IRRIGATION AND DEBRIDEMENT PERIRECTAL ABSCESS;  Surgeon: Jordis Laneta FALCON, MD;  Location: [facility] ORS;  Service: General;  Laterality: N/A;   IR BONE MARROW BIOPSY & ASPIRATION  07/09/2021   IR BONE MARROW BIOPSY & ASPIRATION  09/03/2023   LAPAROSCOPIC PARTIAL COLECTOMY     stage 3-C carcinoma of the cecum, s/p chemotherapy and xrt   LITHOTRIPSY     SIGMOIDOSCOPY  08/26/1993   THYROID  LOBECTOMY  2002   s/p XRT   TOTAL HIP ARTHROPLASTY Right 10/24/2019   Procedure: TOTAL HIP ARTHROPLASTY;  Surgeon: Edie Norleen PARAS, MD;  Location: [facility] ORS;  Service: Orthopedics;  Laterality: Right;   Patient Active Problem List   Diagnosis Date Noted  Hypomagnesemia 08/10/2023   Hoarseness 03/28/2023   Tremor 02/14/2023   Depression with anxiety 04/04/2022   Hypokalemia 04/04/2022   Diarrhea 04/04/2022   Chronic diastolic CHF (congestive heart failure) (HCC) 04/04/2022   Hypophosphatemia 04/04/2022   Electrolyte abnormality 03/31/2022   Hydronephrosis of right kidney 03/31/2022   Vaginal dryness 01/05/2022   Liver nodule 11/18/2021   Major depressive disorder, single episode, mild (HCC) 06/01/2021   Fatty liver disease, nonalcoholic 04/18/2020   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   Pancytopenia (HCC) 02/11/2020   Status post total hip replacement, 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   Lumbar spondylosis 08/25/2019   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/10/2019   Leukopenia  08/21/2018   Change in vision 06/24/2018   Chronic daily headache 05/31/2018   Hypothyroidism 05/02/2018   Vitamin D  deficiency 07/27/2015   Loose stools 04/14/2015   Health care maintenance 06/17/2014   Flatulence, eructation and gas pain 06/17/2014   Stress 01/28/2014   Abnormal respiratory rate 06/19/2013   Environmental allergies 02/25/2013   Abnormal liver function test 12/07/2012   Anxiety 05/06/2012   GERD (gastroesophageal reflux disease) 05/06/2012   Osteopenia 05/06/2012   Hematuria 05/06/2012   Osteoporosis 05/06/2012   History of breast cancer 09/18/2008   History of thyroid  cancer 09/18/2008   Hypercholesterolemia 09/18/2008   Chemotherapy-induced neuropathy (HCC) 09/18/2008   Palpitations 09/18/2008   History of malignant neoplasm of large intestine 09/18/2008   Malignant neoplasm of breast (female) (HCC) 09/18/2008   Malignant neoplasm of thyroid  gland (HCC) 09/18/2008   Hereditary and idiopathic peripheral neuropathy 09/18/2008    ONSET DATE:  ~3 months ago  REFERRING DIAG: Dysphonia, Hoarseness  THERAPY DIAG:  Dysphonia  Rationale for Evaluation and Treatment Rehabilitation  SUBJECTIVE:   SUBJECTIVE STATEMENT: Pt alert and cooperative. Pt accompanied by: self  PERTINENT HISTORY: Reports worsening hoarseness for ~3 months  DIAGNOSTIC FINDINGS: Laryngoscopy 09/2022 mild bowing of vocal folds  PAIN:  Are you having pain? No   FALLS: Has patient fallen in last 6 months? No, Number of falls: 0  LIVING ENVIRONMENT: Lives with: lives alone Lives in: House/apartment  PLOF: Independent  PATIENT GOALS    for voice to improve  OBJECTIVE:   TODAY'S TREATMENT:  Reviewed HEP for dysphonia. Pt completed with use of biofeedback/Voice Analyst app averaging 84-85dB for sustained phonation, ascending and descending pitch glides, and phrases. Pt completed each ex x10.  Education completed via rationale as well as changes to speech in setting of  Parkinsonism as well as bowed vocal cords.  Continued conversation training therapy. Pt able to hear difference between clear speech and dysphonia. Encouraged to utilize clear speech (slow, loud) with appreciable difference in vocal quality. Pt able to adjust voicing with rare-min cueing during 15 minute speech sample.  Education also completed re: substitute behaviors for throat clearing, general vocal hygiene, and head/neck stretches to reduce tension.      PATIENT EDUCATION: Education details: as above Person educated: Patient Education method: Programmer, multimedia; Handout provided Education comprehension: verbalized understanding, returned demonstration, and needs further education   HOME EXERCISE PROGRAM: Practice clear speech therapy and voice ex's as above     GOALS: Goals reviewed with patient? Yes  SHORT TERM GOALS: Target date: 10 sessions  Pt willverbalize awareness of phonotraumatic behaviors, such as chronic throat clearing, and verbalize/demo non-phonotraumatic alternative to throat clearing. Baseline: Goal status: INITIAL  2.  Pt will be independent in HEP for voice. Baseline:  Goal status: INITIAL  3.  Pt will participate in 5-8 minutes conversation,  maintaining average loudness of 75 dB and loud, good quality voice with min cues.     Baseline:  Goal status: INITIAL  4. Pt will demonstrate independent understanding of vocal hygiene concepts.   Baseline:  Goal status: INITIAL    LONG TERM GOALS: Target date: 12 weeks  Pt will improve score on the VHI by 10% indicating improved perception of speech abilities.  Baseline:  Goal status: INITIAL  2.  The patient will participate in 15-20 minutes conversation, maintaining average loudness of 75 dB and loud, good quality voice.    Baseline: 64/120 Goal status: INITIAL   ASSESSMENT:  CLINICAL IMPRESSION: Patient is a 73 y.o. female who was seen today for voice treatment due to complaints of hoarse, raspy, and  weak vocal quality. On initial eval, with c/o vocal fatigue stating, my voice gives out at the end of the day. Pt currently undergoing chemotx for AML and is reported suboptimal water intake. Pt with hx of Parkinson's Disease, GERD, anxiety, thyroid  cancer, colon cancer, and breast cancer. Based on VHI, pt's dysphonia is severe and with impacts on daily and social communication. Perceptual features of dysphonia observed on evaluation include hoarseness, strain, and fry. When cued to use clear speech therapy, notable subjective and auditory-perceptual improvement in hoarseness observed. Pt with habitual throat clearing observed. Flexible laryngoscopic evaluation completed 09/15/23 was unremarkable except mild bowing of vocal folds. See details of ST above. Recommend course of ST for dysphonia to improve pt's QoL.   OBJECTIVE IMPAIRMENTS include voice disorder. These impairments are limiting patient from effectively communicating at home and in community. Factors affecting potential to achieve goals and functional outcome are co-morbidities. Patient will benefit from skilled SLP services to address above impairments and improve overall function.  REHAB POTENTIAL: Good  PLAN: SLP FREQUENCY: 1x/week  SLP DURATION: 12 weeks  PLANNED INTERVENTIONS: Environmental controls, Cueing hierachy, Functional tasks, SLP instruction and feedback, Compensatory strategies, and Patient/family education   Delon Bangs, M.S., CCC-SLP Speech-Language Pathologist [place] [facility] [phone] [place])   [facility] at [facility] [address] [place], [place], [address] Phone: [phone]   Fax:  [phone]

## 2023-12-01 ENCOUNTER — Other Ambulatory Visit: Payer: Self-pay

## 2023-12-01 NOTE — Progress Notes (Signed)
 Specialty Pharmacy Refill Coordination Note  Debra Hale is a 73 y.o. female contacted today regarding refills of specialty medication(s) Olutasidenib  (REZLIDHIA )   Patient requested Delivery   Delivery date: 12/02/23   Verified address: [address]  [place] [address]   Medication will be filled on 12/01/23.

## 2023-12-07 ENCOUNTER — Ambulatory Visit: Attending: Unknown Physician Specialty

## 2023-12-07 DIAGNOSIS — R490 Dysphonia: Secondary | ICD-10-CM | POA: Insufficient documentation

## 2023-12-07 NOTE — Therapy (Signed)
 OUTPATIENT SPEECH LANGUAGE PATHOLOGY  VOICE TREATMENT   Patient Name: Debra Hale MRN: [id number] DOB:06/02/1950, 73 y.o., female Today's Date: 12/07/2023  PCP: Allena Hamilton, MD REFERRING PROVIDER: Chinita Hasten, MD   End of Session - 12/07/23 1237     Visit Number 9    Number of Visits 12    Date for SLP Re-Evaluation 12/15/23    SLP Start Time 1150    SLP Stop Time  1235    SLP Time Calculation (min) 45 min    Activity Tolerance Patient tolerated treatment well          Past Medical History:  Diagnosis Date   Abdominal pain 03/06/2016   Acute pericarditis 05/31/2013   Anxiety    Arthritis    Osteoarthritis   Breast cancer (HCC) 2009   right breast lumpectomy with rad tx   Colon cancer (HCC)    surgery with chemo and rad tx   Complication of anesthesia    GERD (gastroesophageal reflux disease)    Heart palpitations    History of hiatal hernia    History of kidney stones    History of thyroid  cancer 09/18/2008   Qualifier: Diagnosis of   By: Wynetta, CMA, Carol       Hypothyroidism    Liver disease    Liver nodule    s/p negative biopsy   Malignant neoplasm of thyroid  gland (HCC) 2002   s/p surgery and XRT   Osteoporosis    Other and unspecified hyperlipidemia    Palpitations    Personal history of chemotherapy    Personal history of malignant neoplasm of large intestine    carcinoma - cecum, s/p right laparoscopic colectomy - s/p chemotherapy and XRT   Personal history of radiation therapy    Pneumonia 2019   PONV (postoperative nausea and vomiting)    Pure hypercholesterolemia    Unspecified hereditary and idiopathic peripheral neuropathy    Past Surgical History:  Procedure Laterality Date   APPENDECTOMY  1985   BREAST BIOPSY Right 2009   positive   BREAST BIOPSY Right 2009   negative   BREAST LUMPECTOMY Right 2009   breast cancer   CHOLECYSTECTOMY  1995   COLONOSCOPY     COLONOSCOPY WITH PROPOFOL  N/A 10/29/2017   Procedure: COLONOSCOPY  WITH PROPOFOL ;  Surgeon: Viktoria Lamar DASEN, MD;  Location: [facility] ENDOSCOPY;  Service: Endoscopy;  Laterality: N/A;   DILATION AND CURETTAGE OF UTERUS  1990   DILATION AND CURETTAGE, DIAGNOSTIC / THERAPEUTIC  1990   ESOPHAGOGASTRODUODENOSCOPY     ESOPHAGOGASTRODUODENOSCOPY (EGD) WITH PROPOFOL  N/A 10/29/2017   Procedure: ESOPHAGOGASTRODUODENOSCOPY (EGD) WITH PROPOFOL ;  Surgeon: Viktoria Lamar DASEN, MD;  Location: [facility] ENDOSCOPY;  Service: Endoscopy;  Laterality: N/A;   INCISION AND DRAINAGE PERIRECTAL ABSCESS N/A 06/17/2020   Procedure: IRRIGATION AND DEBRIDEMENT PERIRECTAL ABSCESS;  Surgeon: Jordis Laneta FALCON, MD;  Location: [facility] ORS;  Service: General;  Laterality: N/A;   IR BONE MARROW BIOPSY & ASPIRATION  07/09/2021   IR BONE MARROW BIOPSY & ASPIRATION  09/03/2023   LAPAROSCOPIC PARTIAL COLECTOMY     stage 3-C carcinoma of the cecum, s/p chemotherapy and xrt   LITHOTRIPSY     SIGMOIDOSCOPY  08/26/1993   THYROID  LOBECTOMY  2002   s/p XRT   TOTAL HIP ARTHROPLASTY Right 10/24/2019   Procedure: TOTAL HIP ARTHROPLASTY;  Surgeon: Edie Norleen PARAS, MD;  Location: [facility] ORS;  Service: Orthopedics;  Laterality: Right;   Patient Active Problem List   Diagnosis Date Noted  Hypomagnesemia 08/10/2023   Hoarseness 03/28/2023   Tremor 02/14/2023   Depression with anxiety 04/04/2022   Hypokalemia 04/04/2022   Diarrhea 04/04/2022   Chronic diastolic CHF (congestive heart failure) (HCC) 04/04/2022   Hypophosphatemia 04/04/2022   Electrolyte abnormality 03/31/2022   Hydronephrosis of right kidney 03/31/2022   Vaginal dryness 01/05/2022   Liver nodule 11/18/2021   Major depressive disorder, single episode, mild (HCC) 06/01/2021   Fatty liver disease, nonalcoholic 04/18/2020   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   Pancytopenia (HCC) 02/11/2020   Status post total hip replacement, 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   Lumbar spondylosis 08/25/2019   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/10/2019   Leukopenia  08/21/2018   Change in vision 06/24/2018   Chronic daily headache 05/31/2018   Hypothyroidism 05/02/2018   Vitamin D  deficiency 07/27/2015   Loose stools 04/14/2015   Health care maintenance 06/17/2014   Flatulence, eructation and gas pain 06/17/2014   Stress 01/28/2014   Abnormal respiratory rate 06/19/2013   Environmental allergies 02/25/2013   Abnormal liver function test 12/07/2012   Anxiety 05/06/2012   GERD (gastroesophageal reflux disease) 05/06/2012   Osteopenia 05/06/2012   Hematuria 05/06/2012   Osteoporosis 05/06/2012   History of breast cancer 09/18/2008   History of thyroid  cancer 09/18/2008   Hypercholesterolemia 09/18/2008   Chemotherapy-induced neuropathy (HCC) 09/18/2008   Palpitations 09/18/2008   History of malignant neoplasm of large intestine 09/18/2008   Malignant neoplasm of breast (female) (HCC) 09/18/2008   Malignant neoplasm of thyroid  gland (HCC) 09/18/2008   Hereditary and idiopathic peripheral neuropathy 09/18/2008    ONSET DATE:  ~3 months ago  REFERRING DIAG: Dysphonia, Hoarseness  THERAPY DIAG:  Dysphonia  Rationale for Evaluation and Treatment Rehabilitation  SUBJECTIVE:   SUBJECTIVE STATEMENT: Pt alert and cooperative. Pt accompanied by: self  PERTINENT HISTORY: Reports worsening hoarseness for ~3 months  DIAGNOSTIC FINDINGS: Laryngoscopy 09/2022 mild bowing of vocal folds  PAIN:  Are you having pain? No   FALLS: Has patient fallen in last 6 months? No, Number of falls: 0  LIVING ENVIRONMENT: Lives with: lives alone Lives in: House/apartment  PLOF: Independent  PATIENT GOALS    for voice to improve  OBJECTIVE:   TODAY'S TREATMENT:  Reviewed HEP for dysphonia.   Continued conversation training therapy. Pt able to hear difference between clear speech and dysphonia. Encouraged to utilize clear speech (slow, loud) with appreciable difference in vocal quality. Pt able to adjust voicing with rare-min cueing during 15  minute speech sample.  Repeat VHI with score as follows: VOICE HANDICAP INDEX (VHI)   The Voice Handicap Index is comprised of a series of questions to assess the patient's perception of their voice. It is designed to evaluate the emotional, physical and functional components of the voice problem.   Functional: 8/40 Physical: 6/40 Emotional: 2/40 Total: 16/120  *Mild  Marked improvement in VHI from admission.       PATIENT EDUCATION: Education details: as above Person educated: Patient Education method: Programmer, multimedia; Handout provided Education comprehension: verbalized understanding, returned demonstration, and needs further education   HOME EXERCISE PROGRAM: Practice clear speech therapy and voice ex's as above     GOALS: Goals reviewed with patient? Yes  SHORT TERM GOALS: Target date: 10 sessions  Pt willverbalize awareness of phonotraumatic behaviors, such as chronic throat clearing, and verbalize/demo non-phonotraumatic alternative to throat clearing. Baseline: Goal status: INITIAL  2.  Pt will be independent in HEP for voice. Baseline:  Goal status: INITIAL  3.  Pt will participate in  5-8 minutes conversation, maintaining average loudness of 75 dB and loud, good quality voice with min cues.     Baseline:  Goal status: INITIAL  4. Pt will demonstrate independent understanding of vocal hygiene concepts.   Baseline:  Goal status: INITIAL    LONG TERM GOALS: Target date: 12 weeks  Pt will improve score on the VHI by 10% indicating improved perception of speech abilities.  Baseline: 64/120 Goal status: INITIAL  2.  The patient will participate in 15-20 minutes conversation, maintaining average loudness of 75 dB and loud, good quality voice.    Baseline:  Goal status: INITIAL   ASSESSMENT:  CLINICAL IMPRESSION: Patient is a 73 y.o. female who was seen today for voice treatment due to complaints of hoarse, raspy, and weak vocal quality. On initial  eval, with c/o vocal fatigue stating, my voice gives out at the end of the day. Pt currently undergoing chemotx for AML and is reported suboptimal water intake. Pt with hx of Parkinson's Disease, GERD, anxiety, thyroid  cancer, colon cancer, and breast cancer. Based on VHI, pt's dysphonia is severe and with impacts on daily and social communication. Perceptual features of dysphonia observed on evaluation include hoarseness, strain, and fry. When cued to use clear speech therapy, notable subjective and auditory-perceptual improvement in hoarseness observed. Pt with habitual throat clearing observed. Flexible laryngoscopic evaluation completed 09/15/23 was unremarkable except mild bowing of vocal folds. See details of ST above. Recommend course of ST for dysphonia to improve pt's QoL.   OBJECTIVE IMPAIRMENTS include voice disorder. These impairments are limiting patient from effectively communicating at home and in community. Factors affecting potential to achieve goals and functional outcome are co-morbidities. Patient will benefit from skilled SLP services to address above impairments and improve overall function.  REHAB POTENTIAL: Good  PLAN: SLP FREQUENCY: 1x/week  SLP DURATION: 12 weeks  PLANNED INTERVENTIONS: Environmental controls, Cueing hierachy, Functional tasks, SLP instruction and feedback, Compensatory strategies, and Patient/family education   Delon Bangs, M.S., CCC-SLP Speech-Language Pathologist [place] [facility] [phone] [place])   [facility] Outpatient Rehabilitation at [facility] [address] [place], [place], [address] Phone: [phone]   Fax:  [phone]

## 2023-12-14 ENCOUNTER — Ambulatory Visit

## 2023-12-14 DIAGNOSIS — R490 Dysphonia: Secondary | ICD-10-CM | POA: Diagnosis not present

## 2023-12-14 NOTE — Therapy (Signed)
 OUTPATIENT SPEECH LANGUAGE PATHOLOGY  VOICE DISCHARGE SUMMARY   Patient Name: Debra Hale MRN: [id number] DOB:10-11-1950, 73 y.o., female Today's Date: 12/14/2023  PCP: Allena Hamilton, MD REFERRING PROVIDER: Chinita Hasten, MD   End of Session - 12/14/23 1152     Visit Number 10    Number of Visits 12    Date for SLP Re-Evaluation 12/15/23    SLP Start Time 1105    SLP Stop Time  1150    SLP Time Calculation (min) 45 min    Activity Tolerance Patient tolerated treatment well          Past Medical History:  Diagnosis Date   Abdominal pain 03/06/2016   Acute pericarditis 05/31/2013   Anxiety    Arthritis    Osteoarthritis   Breast cancer (HCC) 2009   right breast lumpectomy with rad tx   Colon cancer (HCC)    surgery with chemo and rad tx   Complication of anesthesia    GERD (gastroesophageal reflux disease)    Heart palpitations    History of hiatal hernia    History of kidney stones    History of thyroid  cancer 09/18/2008   Qualifier: Diagnosis of   By: Wynetta, CMA, Carol       Hypothyroidism    Liver disease    Liver nodule    s/p negative biopsy   Malignant neoplasm of thyroid  gland (HCC) 2002   s/p surgery and XRT   Osteoporosis    Other and unspecified hyperlipidemia    Palpitations    Personal history of chemotherapy    Personal history of malignant neoplasm of large intestine    carcinoma - cecum, s/p right laparoscopic colectomy - s/p chemotherapy and XRT   Personal history of radiation therapy    Pneumonia 2019   PONV (postoperative nausea and vomiting)    Pure hypercholesterolemia    Unspecified hereditary and idiopathic peripheral neuropathy    Past Surgical History:  Procedure Laterality Date   APPENDECTOMY  1985   BREAST BIOPSY Right 2009   positive   BREAST BIOPSY Right 2009   negative   BREAST LUMPECTOMY Right 2009   breast cancer   CHOLECYSTECTOMY  1995   COLONOSCOPY     COLONOSCOPY WITH PROPOFOL  N/A 10/29/2017   Procedure:  COLONOSCOPY WITH PROPOFOL ;  Surgeon: Viktoria Lamar DASEN, MD;  Location: [facility] ENDOSCOPY;  Service: Endoscopy;  Laterality: N/A;   DILATION AND CURETTAGE OF UTERUS  1990   DILATION AND CURETTAGE, DIAGNOSTIC / THERAPEUTIC  1990   ESOPHAGOGASTRODUODENOSCOPY     ESOPHAGOGASTRODUODENOSCOPY (EGD) WITH PROPOFOL  N/A 10/29/2017   Procedure: ESOPHAGOGASTRODUODENOSCOPY (EGD) WITH PROPOFOL ;  Surgeon: Viktoria Lamar DASEN, MD;  Location: [facility] ENDOSCOPY;  Service: Endoscopy;  Laterality: N/A;   INCISION AND DRAINAGE PERIRECTAL ABSCESS N/A 06/17/2020   Procedure: IRRIGATION AND DEBRIDEMENT PERIRECTAL ABSCESS;  Surgeon: Jordis Laneta FALCON, MD;  Location: [facility] ORS;  Service: General;  Laterality: N/A;   IR BONE MARROW BIOPSY & ASPIRATION  07/09/2021   IR BONE MARROW BIOPSY & ASPIRATION  09/03/2023   LAPAROSCOPIC PARTIAL COLECTOMY     stage 3-C carcinoma of the cecum, s/p chemotherapy and xrt   LITHOTRIPSY     SIGMOIDOSCOPY  08/26/1993   THYROID  LOBECTOMY  2002   s/p XRT   TOTAL HIP ARTHROPLASTY Right 10/24/2019   Procedure: TOTAL HIP ARTHROPLASTY;  Surgeon: Edie Norleen PARAS, MD;  Location: [facility] ORS;  Service: Orthopedics;  Laterality: Right;   Patient Active Problem List   Diagnosis Date Noted  Hypomagnesemia 08/10/2023   Hoarseness 03/28/2023   Tremor 02/14/2023   Depression with anxiety 04/04/2022   Hypokalemia 04/04/2022   Diarrhea 04/04/2022   Chronic diastolic CHF (congestive heart failure) (HCC) 04/04/2022   Hypophosphatemia 04/04/2022   Electrolyte abnormality 03/31/2022   Hydronephrosis of right kidney 03/31/2022   Vaginal dryness 01/05/2022   Liver nodule 11/18/2021   Major depressive disorder, single episode, mild (HCC) 06/01/2021   Fatty liver disease, nonalcoholic 04/18/2020   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   Pancytopenia (HCC) 02/11/2020   Status post total hip replacement, 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   Lumbar spondylosis 08/25/2019   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/10/2019    Leukopenia 08/21/2018   Change in vision 06/24/2018   Chronic daily headache 05/31/2018   Hypothyroidism 05/02/2018   Vitamin D  deficiency 07/27/2015   Loose stools 04/14/2015   Health care maintenance 06/17/2014   Flatulence, eructation and gas pain 06/17/2014   Stress 01/28/2014   Abnormal respiratory rate 06/19/2013   Environmental allergies 02/25/2013   Abnormal liver function test 12/07/2012   Anxiety 05/06/2012   GERD (gastroesophageal reflux disease) 05/06/2012   Osteopenia 05/06/2012   Hematuria 05/06/2012   Osteoporosis 05/06/2012   History of breast cancer 09/18/2008   History of thyroid  cancer 09/18/2008   Hypercholesterolemia 09/18/2008   Chemotherapy-induced neuropathy (HCC) 09/18/2008   Palpitations 09/18/2008   History of malignant neoplasm of large intestine 09/18/2008   Malignant neoplasm of breast (female) (HCC) 09/18/2008   Malignant neoplasm of thyroid  gland (HCC) 09/18/2008   Hereditary and idiopathic peripheral neuropathy 09/18/2008    ONSET DATE:  ~3 months ago  REFERRING DIAG: Dysphonia, Hoarseness  THERAPY DIAG:  Dysphonia  Rationale for Evaluation and Treatment Rehabilitation  SUBJECTIVE:   SUBJECTIVE STATEMENT: Pt alert and cooperative. Pt accompanied by: self  PERTINENT HISTORY: Reports worsening hoarseness for ~3 months  DIAGNOSTIC FINDINGS: Laryngoscopy 09/2022 mild bowing of vocal folds  PAIN:  Are you having pain? No   FALLS: Has patient fallen in last 6 months? No, Number of falls: 0  LIVING ENVIRONMENT: Lives with: lives alone Lives in: House/apartment  PLOF: Independent  PATIENT GOALS    for voice to improve  OBJECTIVE:   TODAY'S TREATMENT:  Reviewed HEP for dysphonia.   Continued conversation training therapy. Pt able to hear difference between clear speech and dysphonia. Encouraged to utilize clear speech (slow, loud) with appreciable difference in vocal quality. Pt able to adjust voicing with rare-min cueing  during 25 minute speech sample.  Reviewed progress to date, vocal hygiene, and SLP POC.      PATIENT EDUCATION: Education details: as above Person educated: Patient Education method: Programmer, multimedia; Handout provided Education comprehension: verbalized understanding, returned demonstration, and needs further education   HOME EXERCISE PROGRAM: Continue clear speech therapy and HEP     GOALS: Goals reviewed with patient? Yes  SHORT TERM GOALS: Target date: 10 sessions  Pt willverbalize awareness of phonotraumatic behaviors, such as chronic throat clearing, and verbalize/demo non-phonotraumatic alternative to throat clearing. Baseline: Goal status: MET  2.  Pt will be independent in HEP for voice. Baseline:  Goal status: MET  3.  Pt will participate in 5-8 minutes conversation, maintaining average loudness of 75 dB and loud, good quality voice with min cues.     Baseline:  Goal status: MET  4. Pt will demonstrate independent understanding of vocal hygiene concepts.   Baseline:  Goal status: MET    LONG TERM GOALS: Target date: 12 weeks  Pt will improve score on  the VHI by 10% indicating improved perception of speech abilities.  Baseline: 64/120 Goal status: MET  2.  The patient will participate in 15-20 minutes conversation, maintaining average loudness of 75 dB and loud, good quality voice.    Baseline:  Goal status: MET   ASSESSMENT:  CLINICAL IMPRESSION: Patient is a 73 y.o. female who was seen today for voice treatment due to complaints of hoarse, raspy, and weak vocal quality. On initial eval, with c/o vocal fatigue stating, my voice gives out at the end of the day. Pt currently undergoing chemotx for AML and is reported suboptimal water intake. Pt with hx of Parkinson's Disease, GERD, anxiety, thyroid  cancer, colon cancer, and breast cancer. Based on initial VHI, pt's dysphonia is severe and with impacts on daily and social communication. Perceptual features  of dysphonia observed on evaluation include hoarseness, strain, and fry. When cued to use clear speech therapy, notable subjective and auditory-perceptual improvement in hoarseness observed. Flexible laryngoscopic evaluation completed 09/15/23 was unremarkable except mild bowing of vocal folds. See details of ST above. Pt has participated in course of ST targeting phonation resistance training ex's, vocal hygiene, reduction in phonotraumatic behaviors, and improved vocal quality given changes to intensity and resonance. Pt with improved voice per PROM (repeat VHI on 8/5 was 16/120), and pt has met all ST goals. No further ST indicated at this time for voice.     PLAN: D/C ST; continue HEP and use of clear speech; please reconsult should additional ST needs arise  Delon Bangs, M.S., CCC-SLP Speech-Language Pathologist [facility] [phone] [place])   [facility] at [facility] [address] [place], [place], [address] Phone: [phone]   Fax:  [phone]

## 2023-12-20 DIAGNOSIS — R531 Weakness: Secondary | ICD-10-CM | POA: Diagnosis not present

## 2023-12-20 DIAGNOSIS — G20A1 Parkinson's disease without dyskinesia, without mention of fluctuations: Secondary | ICD-10-CM | POA: Diagnosis not present

## 2023-12-20 DIAGNOSIS — R200 Anesthesia of skin: Secondary | ICD-10-CM | POA: Diagnosis not present

## 2023-12-20 DIAGNOSIS — R258 Other abnormal involuntary movements: Secondary | ICD-10-CM | POA: Diagnosis not present

## 2023-12-20 DIAGNOSIS — R1310 Dysphagia, unspecified: Secondary | ICD-10-CM | POA: Diagnosis not present

## 2023-12-21 ENCOUNTER — Encounter

## 2023-12-24 ENCOUNTER — Other Ambulatory Visit: Payer: Self-pay

## 2023-12-24 ENCOUNTER — Other Ambulatory Visit: Payer: Self-pay | Admitting: Pharmacist

## 2023-12-24 DIAGNOSIS — C9200 Acute myeloblastic leukemia, not having achieved remission: Secondary | ICD-10-CM

## 2023-12-24 MED ORDER — OLUTASIDENIB 150 MG PO CAPS
150.0000 mg | ORAL_CAPSULE | Freq: Every day | ORAL | 2 refills | Status: DC
  Filled 2023-12-24 – 2023-12-27 (×2): qty 30, 30d supply, fill #0
  Filled 2024-02-01: qty 30, 30d supply, fill #1
  Filled 2024-02-24: qty 30, 30d supply, fill #2

## 2023-12-27 ENCOUNTER — Ambulatory Visit (INDEPENDENT_AMBULATORY_CARE_PROVIDER_SITE_OTHER): Admitting: *Deleted

## 2023-12-27 ENCOUNTER — Other Ambulatory Visit: Payer: Self-pay | Admitting: Pharmacy Technician

## 2023-12-27 ENCOUNTER — Telehealth: Payer: Self-pay | Admitting: *Deleted

## 2023-12-27 ENCOUNTER — Other Ambulatory Visit: Payer: Self-pay

## 2023-12-27 VITALS — Ht 64.0 in | Wt 124.0 lb

## 2023-12-27 DIAGNOSIS — Z0000 Encounter for general adult medical examination without abnormal findings: Secondary | ICD-10-CM | POA: Diagnosis not present

## 2023-12-27 DIAGNOSIS — Z1159 Encounter for screening for other viral diseases: Secondary | ICD-10-CM

## 2023-12-27 NOTE — Patient Instructions (Signed)
 Debra Hale , Thank you for taking time out of your busy schedule to complete your Annual Wellness Visit with me. I enjoyed our conversation and look forward to speaking with you again next year. I, as well as your care team,  appreciate your ongoing commitment to your health goals. Please review the following plan we discussed and let me know if I can assist you in the future. Your Game plan/ To Do List    Referrals: If you haven't heard from the office you've been referred to, please reach out to them at the phone provided.  An order has been placed for a Hepatitis C screening the next time you have lab work. Consider updating your vaccines, mammogram and colonoscopy.   Follow up Visits: We will see or speak with you next year for your Next Medicare AWV with our clinical staff 12/27/24 @ 1:40 Have you seen your provider in the last 6 months (3 months if uncontrolled diabetes)? Yes  Clinician Recommendations:  Aim for 30 minutes of exercise or brisk walking, 6-8 glasses of water, and 5 servings of fruits and vegetables each day.       This is a list of the screenings recommended for you:  Health Maintenance  Topic Date Due   Hepatitis C Screening  Never done   Zoster (Shingles) Vaccine (1 of 2) Never done   DTaP/Tdap/Td vaccine (2 - Tdap) 01/18/2001   Pneumococcal Vaccine for age over 58 (2 of 2 - PPSV23, PCV20, or PCV21) 03/01/2012   Colon Cancer Screening  10/29/2020   Flu Shot  12/03/2023   Medicare Annual Wellness Visit  12/26/2024   DEXA scan (bone density measurement)  Completed   HPV Vaccine  Aged Out   Meningitis B Vaccine  Aged Out   Hepatitis B Vaccine  Discontinued   Mammogram  Discontinued   COVID-19 Vaccine  Discontinued    Advanced directives: (Copy Requested) Please bring a copy of your health care power of attorney and living will to the office to be added to your chart at your convenience. You can mail to [facility] [address] [place], [place]  [address] or email to [email or web address] Advance Care Planning is important because it:  [x]  Makes sure you receive the medical care that is consistent with your values, goals, and preferences  [x]  It provides guidance to your family and loved ones and reduces their decisional burden about whether or not they are making the right decisions based on your wishes.

## 2023-12-27 NOTE — Telephone Encounter (Signed)
 Performed AWV While reviewing patient's medication was advised that she does not recall ever taking Rosuvastatin . Patient stated if she is suppose to be taking the medication  she needs a script sent in to the pharmacy and a call back. [facility]

## 2023-12-27 NOTE — Progress Notes (Signed)
 Subjective:   Debra Hale is a 73 y.o. who presents for a Medicare Wellness preventive visit.  As a reminder, Annual Wellness Visits don't include a physical exam, and some assessments may be limited, especially if this visit is performed virtually. We may recommend an in-person follow-up visit with your provider if needed.  Visit Complete: Virtual I connected with  Debra Hale on 12/27/23 by a audio enabled telemedicine application and verified that I am speaking with the correct person using two identifiers.  Patient Location: Home  Provider Location: Home Office  I discussed the limitations of evaluation and management by telemedicine. The patient expressed understanding and agreed to proceed.  Vital Signs: Because this visit was a virtual/telehealth visit, some criteria may be missing or patient reported. Any vitals not documented were not able to be obtained and vitals that have been documented are patient reported.  VideoDeclined- This patient declined Librarian, academic. Therefore the visit was completed with audio only.  Persons Participating in Visit: Patient.  AWV Questionnaire: No: Patient Medicare AWV questionnaire was not completed prior to this visit.  Cardiac Risk Factors include: advanced age (>49men, >63 women);dyslipidemia     Objective:    Today's Vitals   12/27/23 1503  Weight: 124 lb (56.2 kg)  Height: 5' 4 (1.626 m)   Body mass index is 21.28 kg/m.     12/27/2023    3:27 PM 09/21/2023    2:39 PM 09/03/2023    9:04 AM 07/27/2023    2:17 PM 05/31/2023    2:08 PM 04/05/2023   10:50 AM 03/08/2023   10:57 AM  Advanced Directives  Does Patient Have a Medical Advance Directive? Yes Yes Yes Yes Yes Yes Yes  Type of Estate agent of [place];Living will Healthcare Power of [place];Living will  Healthcare Power of [place];Living will Living will  Living will  Does patient want to make changes to medical  advance directive?       No - Patient declined  Copy of Healthcare Power of Attorney in Chart? No - copy requested        Would patient like information on creating a medical advance directive?       No - Patient declined    Current Medications (verified) Outpatient Encounter Medications as of 12/27/2023  Medication Sig   Azelastine -Fluticasone  137-50 MCG/ACT SUSP SPRAY 2 SPRAYS INTO EACH NOSTRIL EVERY DAY   clonazePAM  (KLONOPIN ) 0.5 MG tablet Take 1 tablet (0.5 mg total) by mouth 3 (three) times daily as needed for anxiety. (Patient taking differently: Take 0.5 mg by mouth 3 (three) times daily as needed for anxiety. Takes 1 1/2 two times a day)   escitalopram  (LEXAPRO ) 20 MG tablet Take 20 mg by mouth every morning.    levothyroxine  (SYNTHROID ) 75 MCG tablet Take 1 tablet (75 mcg total) by mouth daily.   nortriptyline  (PAMELOR ) 25 MG capsule Take 25 mg by mouth 2 (two) times daily.   olutasidenib  (REZLIDHIA ) 150 MG capsule Take 1 capsule (150 mg total) by mouth daily. Take on an empty stomach at least 1 hour before or 2 hours after a meal.   pantoprazole  (PROTONIX ) 40 MG tablet TAKE ONE TABLET BY MOUTH TWICE DAILY BEFORE A MEAL   potassium chloride  (KLOR-CON  M) 10 MEQ tablet TAKE 1 TABLET BY MOUTH TWICE DAILY   valACYclovir  (VALTREX ) 500 MG tablet TAKE 1 TABLET BY MOUTH DAILY   Vitamin D , Ergocalciferol , (DRISDOL ) 1.25 MG (50000 UNIT) CAPS capsule Take 1  capsule (50,000 Units total) by mouth every 7 (seven) days.   Carbidopa-Levodopa ER (SINEMET CR) 25-100 MG tablet controlled release Take 25/100 mg CR tablet nightly (Patient not taking: Reported on 12/27/2023)   prochlorperazine  (COMPAZINE ) 10 MG tablet TAKE ONE TABLET BY MOUTH EVERY 6 HOURS AS NEEDED FOR NAUSEA / VOMITING (Patient not taking: Reported on 12/27/2023)   rosuvastatin  (CRESTOR ) 5 MG tablet Take 1 tablet (5 mg total) by mouth daily. (Patient not taking: Reported on 12/27/2023)   No facility-administered encounter medications on file  as of 12/27/2023.    Allergies (verified) Demeclocycline, Sulfa  antibiotics, Tetracyclines & related, Chlordiazepoxide, Dicyclomine, Levofloxacin , Augmentin  [amoxicillin -pot clavulanate], Bentyl [dicyclomine hcl], Ciprofloxacin , Codeine, Dicyclomine hcl, Epinephrine , Flagyl  [metronidazole ], Librax [chlordiazepoxide-clidinium], Novocain [procaine], Phenobarbital, Prednisone , and Tramadol   History: Past Medical History:  Diagnosis Date   Abdominal pain 03/06/2016   Acute pericarditis 05/31/2013   Anxiety    Arthritis    Osteoarthritis   Breast cancer (HCC) 2009   right breast lumpectomy with rad tx   Colon cancer (HCC)    surgery with chemo and rad tx   Complication of anesthesia    GERD (gastroesophageal reflux disease)    Heart palpitations    History of hiatal hernia    History of kidney stones    History of thyroid  cancer 09/18/2008   Qualifier: Diagnosis of   By: Wynetta, CMA, Carol       Hypothyroidism    Liver disease    Liver nodule    s/p negative biopsy   Malignant neoplasm of thyroid  gland (HCC) 2002   s/p surgery and XRT   Osteoporosis    Other and unspecified hyperlipidemia    Palpitations    Personal history of chemotherapy    Personal history of malignant neoplasm of large intestine    carcinoma - cecum, s/p right laparoscopic colectomy - s/p chemotherapy and XRT   Personal history of radiation therapy    Pneumonia 2019   PONV (postoperative nausea and vomiting)    Pure hypercholesterolemia    Unspecified hereditary and idiopathic peripheral neuropathy    Past Surgical History:  Procedure Laterality Date   APPENDECTOMY  1985   BREAST BIOPSY Right 2009   positive   BREAST BIOPSY Right 2009   negative   BREAST LUMPECTOMY Right 2009   breast cancer   CHOLECYSTECTOMY  1995   COLONOSCOPY     COLONOSCOPY WITH PROPOFOL  N/A 10/29/2017   Procedure: COLONOSCOPY WITH PROPOFOL ;  Surgeon: Viktoria Lamar DASEN, MD;  Location: [facility] ENDOSCOPY;  Service: Endoscopy;   Laterality: N/A;   DILATION AND CURETTAGE OF UTERUS  1990   DILATION AND CURETTAGE, DIAGNOSTIC / THERAPEUTIC  1990   ESOPHAGOGASTRODUODENOSCOPY     ESOPHAGOGASTRODUODENOSCOPY (EGD) WITH PROPOFOL  N/A 10/29/2017   Procedure: ESOPHAGOGASTRODUODENOSCOPY (EGD) WITH PROPOFOL ;  Surgeon: Viktoria Lamar DASEN, MD;  Location: [facility] ENDOSCOPY;  Service: Endoscopy;  Laterality: N/A;   INCISION AND DRAINAGE PERIRECTAL ABSCESS N/A 06/17/2020   Procedure: IRRIGATION AND DEBRIDEMENT PERIRECTAL ABSCESS;  Surgeon: Jordis Laneta FALCON, MD;  Location: [facility] ORS;  Service: General;  Laterality: N/A;   IR BONE MARROW BIOPSY & ASPIRATION  07/09/2021   IR BONE MARROW BIOPSY & ASPIRATION  09/03/2023   LAPAROSCOPIC PARTIAL COLECTOMY     stage 3-C carcinoma of the cecum, s/p chemotherapy and xrt   LITHOTRIPSY     SIGMOIDOSCOPY  08/26/1993   THYROID  LOBECTOMY  2002   s/p XRT   TOTAL HIP ARTHROPLASTY Right 10/24/2019   Procedure: TOTAL HIP ARTHROPLASTY;  Surgeon:  Poggi, Norleen PARAS, MD;  Location: [facility] ORS;  Service: Orthopedics;  Laterality: Right;   Family History  Problem Relation Age of Onset   Stroke Mother        58s   Alzheimer's disease Mother    Lung cancer Father    Prostate cancer Father    Cancer Father        Colon   Colon cancer Father    Breast cancer Sister        38's   Lung cancer Sister    Breast cancer Maternal Aunt    Social History   Socioeconomic History   Marital status: Single    Spouse name: Not on file   Number of children: 0   Years of education: Not on file   Highest education level: Not on file  Occupational History   Occupation: Retired  Tobacco Use   Smoking status: Never   Smokeless tobacco: Never  Vaping Use   Vaping status: Never Used  Substance and Sexual Activity   Alcohol use: No    Alcohol/week: 0.0 standard drinks of alcohol   Drug use: No   Sexual activity: Not Currently  Other Topics Concern   Not on file  Social History Narrative   Lives alone and occasionally has  friend over.   Social Drivers of Corporate investment banker Strain: Low Risk  (12/27/2023)   Overall Financial Resource Strain (CARDIA)    Difficulty of Paying Living Expenses: Not hard at all  Food Insecurity: No Food Insecurity (12/27/2023)   Hunger Vital Sign    Worried About Running Out of Food in the Last Year: Never true    Ran Out of Food in the Last Year: Never true  Transportation Needs: No Transportation Needs (12/27/2023)   PRAPARE - Administrator, Civil Service (Medical): No    Lack of Transportation (Non-Medical): No  Physical Activity: Inactive (12/27/2023)   Exercise Vital Sign    Days of Exercise per Week: 0 days    Minutes of Exercise per Session: 0 min  Stress: Stress Concern Present (12/27/2023)   [employer] of Occupational Health - Occupational Stress Questionnaire    Feeling of Stress: To some extent  Social Connections: Moderately Isolated (12/27/2023)   Social Connection and Isolation Panel    Frequency of Communication with Friends and Family: More than three times a week    Frequency of Social Gatherings with Friends and Family: Once a week    Attends Religious Services: More than 4 times per year    Active Member of [employer] or Organizations: No    Attends Banker Meetings: Never    Marital Status: Divorced    Tobacco Counseling Counseling given: Not Answered    Clinical Intake:  Pre-visit preparation completed: Yes  Pain : No/denies pain     BMI - recorded: 21.28 Nutritional Status: BMI of 19-24  Normal Nutritional Risks: None Diabetes: No  Lab Results  Component Value Date   HGBA1C 5.3 12/10/2016   HGBA1C 5.2 08/30/2013     How often do you need to have someone help you when you read instructions, pamphlets, or other written materials from your doctor or pharmacy?: 1 - Never  Interpreter Needed?: No  Information entered by :: R. Janiya Millirons LPN   Activities of Daily Living     12/27/2023    3:07 PM  In your  present state of health, do you have any difficulty performing the following activities:  Hearing?  1  Vision? 0  Difficulty concentrating or making decisions? 0  Walking or climbing stairs? 1  Dressing or bathing? 0  Doing errands, shopping? 0  Preparing Food and eating ? N  Using the Toilet? N  In the past six months, have you accidently leaked urine? Y  Comment had UTI  Do you have problems with loss of bowel control? N  Managing your Medications? N  Managing your Finances? N  Housekeeping or managing your Housekeeping? N    Patient Care Team: Glendia Shad, MD as PCP - General (Internal Medicine) Darron Deatrice LABOR, MD as PCP - Cardiology (Cardiology) Jacobo Evalene PARAS, MD as Consulting Physician (Oncology)  I have updated your Care Teams any recent Medical Services you may have received from other providers in the past year.     Assessment:   This is a routine wellness examination for Debra Hale.  Hearing/Vision screen Hearing Screening - Comments:: Some issues Vision Screening - Comments:: readers   Goals Addressed             This Visit's Progress    Patient Stated       Wants to go out more        Depression Screen     12/27/2023    3:21 PM 11/09/2023    1:44 PM 08/10/2023    1:45 PM 02/11/2023    3:25 PM 11/13/2022    9:26 AM 09/18/2022    2:12 PM 08/10/2022    3:48 PM  PHQ 2/9 Scores  PHQ - 2 Score 1 2 2  2  0 2  PHQ- 9 Score 7 7 9  6  7   Exception Documentation    Patient refusal       Fall Risk     12/27/2023    3:11 PM 11/09/2023    1:43 PM 03/25/2023    2:48 PM 09/18/2022    2:12 PM 03/30/2022    2:13 PM  Fall Risk   Falls in the past year? 0 0 0 0 0  Number falls in past yr: 0 0 0 0 0  Injury with Fall? 0 0 0 0 0  Risk for fall due to : No Fall Risks No Fall Risks No Fall Risks No Fall Risks No Fall Risks  Follow up Falls evaluation completed;Falls prevention discussed Falls evaluation completed;Education provided Falls evaluation completed  Falls evaluation completed Falls evaluation completed      Data saved with a previous flowsheet row definition    MEDICARE RISK AT HOME:  Medicare Risk at Home Any stairs in or around the home?: Yes If so, are there any without handrails?: No Home free of loose throw rugs in walkways, pet beds, electrical cords, etc?: Yes Adequate lighting in your home to reduce risk of falls?: Yes Life alert?: No Use of a cane, walker or w/c?: No Grab bars in the bathroom?: Yes Shower chair or bench in shower?: Yes Elevated toilet seat or a handicapped toilet?: No  TIMED UP AND GO:  Was the test performed?  No  Cognitive Function: 6CIT completed    09/08/2016    3:56 PM  MMSE - Mini Mental State Exam  Orientation to time 5   Orientation to Place 5   Registration 3   Attention/ Calculation 5   Recall 3   Language- name 2 objects 2   Language- repeat 1  Language- follow 3 step command 3   Language- read & follow direction 1   Write a sentence 1  Copy design 1   Total score 30      Data saved with a previous flowsheet row definition        12/27/2023    3:27 PM 09/18/2022    2:16 PM 06/07/2019    2:27 PM  6CIT Screen  What Year? 0 points 0 points 0 points  What month? 0 points 0 points 0 points  What time? 0 points 0 points 0 points  Count back from 20 2 points 0 points 0 points  Months in reverse 0 points 0 points 0 points  Repeat phrase 0 points 0 points   Total Score 2 points 0 points     Immunizations Immunization History  Administered Date(s) Administered   Hep A / Hep B 10/16/2013   INFLUENZA, HIGH DOSE SEASONAL PF 03/02/2017   Influenza Split 02/04/2012   Influenza,inj,Quad PF,6+ Mos 01/23/2014, 06/09/2016   PPD Test 10/26/2019   Pneumococcal Conjugate-13 01/05/2012   Td 01/19/1991    Screening Tests Health Maintenance  Topic Date Due   Hepatitis C Screening  Never done   Zoster Vaccines- Shingrix (1 of 2) Never done   DTaP/Tdap/Td (2 - Tdap) 01/18/2001    Pneumococcal Vaccine: 50+ Years (2 of 2 - PPSV23, PCV20, or PCV21) 03/01/2012   Colonoscopy  10/29/2020   Medicare Annual Wellness (AWV)  09/18/2023   INFLUENZA VACCINE  12/03/2023   DEXA SCAN  Completed   HPV VACCINES  Aged Out   Meningococcal B Vaccine  Aged Out   Hepatitis B Vaccines 19-59 Average Risk  Discontinued   MAMMOGRAM  Discontinued   COVID-19 Vaccine  Discontinued    Health Maintenance  Health Maintenance Due  Topic Date Due   Hepatitis C Screening  Never done   Zoster Vaccines- Shingrix (1 of 2) Never done   DTaP/Tdap/Td (2 - Tdap) 01/18/2001   Pneumococcal Vaccine: 50+ Years (2 of 2 - PPSV23, PCV20, or PCV21) 03/01/2012   Colonoscopy  10/29/2020   Medicare Annual Wellness (AWV)  09/18/2023   INFLUENZA VACCINE  12/03/2023   Health Maintenance Items Addressed: Labs Ordered: Hepatitis C screening , Patient declines vaccines, mammogram and colonoscopy  Additional Screening:  Vision Screening: Recommended annual ophthalmology exams for early detection of glaucoma and other disorders of the eye. Overdue  Patient has an appointment [facility] at [facility] 01/19/24 Would you like a referral to an eye doctor? No    Dental Screening: Recommended annual dental exams for proper oral hygiene  Community Resource Referral / Chronic Care Management: CRR required this visit?  No   CCM required this visit?  No   Plan:    I have personally reviewed and noted the following in the patient's chart:   Medical and social history Use of alcohol, tobacco or illicit drugs  Current medications and supplements including opioid prescriptions. Patient is not currently taking opioid prescriptions. Functional ability and status Nutritional status Physical activity Advanced directives List of other physicians Hospitalizations, surgeries, and ER visits in previous 12 months Vitals Screenings to include cognitive, depression, and falls Referrals and appointments  In addition, I  have reviewed and discussed with patient certain preventive protocols, quality metrics, and best practice recommendations. A written personalized care plan for preventive services as well as general preventive health recommendations were provided to patient.   Angeline Fredericks, LPN   [id number]   After Visit Summary: (MyChart) Due to this being a telephonic visit, the after visit summary with patients personalized plan was offered to patient via MyChart  Notes: Nothing significant to report at this time. Phone note sent to PCP

## 2023-12-27 NOTE — Progress Notes (Signed)
 Specialty Pharmacy Refill Coordination Note  Debra Hale is a 73 y.o. female contacted today regarding refills of specialty medication(s) Olutasidenib  (REZLIDHIA )   Patient requested Delivery   Delivery date: 01/06/24   Verified address: [address]   [name] [address]   Medication will be filled on 01/05/24.

## 2023-12-28 ENCOUNTER — Encounter

## 2023-12-28 MED ORDER — ROSUVASTATIN CALCIUM 5 MG PO TABS
5.0000 mg | ORAL_TABLET | Freq: Every day | ORAL | 2 refills | Status: DC
Start: 2023-12-28 — End: 2024-02-28

## 2023-12-28 NOTE — Telephone Encounter (Signed)
 Patient says that she thought she was taking it but she cannot find her bottle. I have sent in new prescription. Pt will go pick up.

## 2024-01-04 ENCOUNTER — Other Ambulatory Visit (INDEPENDENT_AMBULATORY_CARE_PROVIDER_SITE_OTHER)

## 2024-01-04 ENCOUNTER — Other Ambulatory Visit: Payer: Self-pay | Admitting: Pharmacist

## 2024-01-04 DIAGNOSIS — C9200 Acute myeloblastic leukemia, not having achieved remission: Secondary | ICD-10-CM

## 2024-01-04 DIAGNOSIS — Z85850 Personal history of malignant neoplasm of thyroid: Secondary | ICD-10-CM | POA: Diagnosis not present

## 2024-01-04 NOTE — Addendum Note (Signed)
 Addended by: MARYLEN PRO A on: 01/04/2024 12:08 PM   Modules accepted: Orders

## 2024-01-05 ENCOUNTER — Other Ambulatory Visit: Payer: Self-pay

## 2024-01-05 ENCOUNTER — Ambulatory Visit: Payer: Self-pay | Admitting: Internal Medicine

## 2024-01-05 LAB — TSH: TSH: 3.96 u[IU]/mL (ref 0.450–4.500)

## 2024-01-19 DIAGNOSIS — D485 Neoplasm of uncertain behavior of skin: Secondary | ICD-10-CM | POA: Diagnosis not present

## 2024-01-24 ENCOUNTER — Other Ambulatory Visit: Payer: Self-pay | Admitting: *Deleted

## 2024-01-24 DIAGNOSIS — C9200 Acute myeloblastic leukemia, not having achieved remission: Secondary | ICD-10-CM

## 2024-01-25 ENCOUNTER — Inpatient Hospital Stay

## 2024-01-25 ENCOUNTER — Inpatient Hospital Stay: Admitting: Oncology

## 2024-01-27 ENCOUNTER — Other Ambulatory Visit (HOSPITAL_COMMUNITY): Payer: Self-pay

## 2024-01-31 DIAGNOSIS — D485 Neoplasm of uncertain behavior of skin: Secondary | ICD-10-CM | POA: Diagnosis not present

## 2024-01-31 DIAGNOSIS — L821 Other seborrheic keratosis: Secondary | ICD-10-CM | POA: Diagnosis not present

## 2024-01-31 DIAGNOSIS — L859 Epidermal thickening, unspecified: Secondary | ICD-10-CM | POA: Diagnosis not present

## 2024-02-01 ENCOUNTER — Other Ambulatory Visit: Payer: Self-pay

## 2024-02-01 NOTE — Progress Notes (Signed)
 Specialty Pharmacy Refill Coordination Note  Madelin NOVAK Eickhoff is a 73 y.o. female contacted today regarding refills of specialty medication(s) Olutasidenib  (REZLIDHIA )   Patient requested Delivery   Delivery date: 02/04/24   Verified address: [address] [place] [address]   Medication will be filled on 10.02.25.

## 2024-02-02 ENCOUNTER — Other Ambulatory Visit: Payer: Self-pay

## 2024-02-05 DIAGNOSIS — R002 Palpitations: Secondary | ICD-10-CM | POA: Diagnosis not present

## 2024-02-15 ENCOUNTER — Other Ambulatory Visit

## 2024-02-15 ENCOUNTER — Encounter: Payer: Self-pay | Admitting: Oncology

## 2024-02-15 ENCOUNTER — Inpatient Hospital Stay: Attending: Oncology | Admitting: Oncology

## 2024-02-15 VITALS — BP 112/78 | HR 90 | Temp 97.9°F | Resp 18 | Ht 64.0 in | Wt 120.0 lb

## 2024-02-15 DIAGNOSIS — Z801 Family history of malignant neoplasm of trachea, bronchus and lung: Secondary | ICD-10-CM | POA: Insufficient documentation

## 2024-02-15 DIAGNOSIS — Z853 Personal history of malignant neoplasm of breast: Secondary | ICD-10-CM | POA: Insufficient documentation

## 2024-02-15 DIAGNOSIS — Z79899 Other long term (current) drug therapy: Secondary | ICD-10-CM | POA: Diagnosis not present

## 2024-02-15 DIAGNOSIS — E7800 Pure hypercholesterolemia, unspecified: Secondary | ICD-10-CM | POA: Diagnosis not present

## 2024-02-15 DIAGNOSIS — C9200 Acute myeloblastic leukemia, not having achieved remission: Secondary | ICD-10-CM | POA: Insufficient documentation

## 2024-02-15 DIAGNOSIS — M810 Age-related osteoporosis without current pathological fracture: Secondary | ICD-10-CM | POA: Insufficient documentation

## 2024-02-15 DIAGNOSIS — Z803 Family history of malignant neoplasm of breast: Secondary | ICD-10-CM | POA: Diagnosis not present

## 2024-02-15 DIAGNOSIS — E039 Hypothyroidism, unspecified: Secondary | ICD-10-CM | POA: Diagnosis not present

## 2024-02-15 DIAGNOSIS — Z79624 Long term (current) use of inhibitors of nucleotide synthesis: Secondary | ICD-10-CM | POA: Insufficient documentation

## 2024-02-15 DIAGNOSIS — Z800 Family history of malignant neoplasm of digestive organs: Secondary | ICD-10-CM | POA: Insufficient documentation

## 2024-02-15 NOTE — Progress Notes (Signed)
 Patient is having nausea but the compazine  isn't helping. She has no appetite.

## 2024-02-15 NOTE — Progress Notes (Signed)
 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  She continues to have occasional nausea, but otherwise feels well.  Her tremor has improved with her Parkinson treatment.  She continues to take and tolerate dose reduced Rezlidhia . She denies any pain.  She has no other neurologic complaints.  She denies any recent fevers or illnesses.  She has a fair appetite, but denies weight loss.  She has no chest pain, shortness of breath, cough, or hemoptysis.  She denies any vomiting, constipation, or diarrhea.  Patient offers no further specific complaints today.  REVIEW OF SYSTEMS:   Review of Systems  Constitutional: Negative.  Negative for fever, malaise/fatigue and weight loss.  HENT:  Negative for congestion and sinus pain.   Respiratory: Negative.  Negative for cough, hemoptysis and shortness of breath.   Cardiovascular: Negative.  Negative for chest pain and leg swelling.  Gastrointestinal:  Positive for nausea. Negative for abdominal pain and diarrhea.  Genitourinary:  Negative for dysuria, frequency and urgency.  Musculoskeletal: Negative.  Negative for back pain and falls.  Skin: Negative.  Negative for rash.  Neurological: Negative.  Negative for dizziness, tremors, speech change, focal weakness, weakness and headaches.  Psychiatric/Behavioral: Negative.  Negative for memory loss. The patient is not nervous/anxious.     As per HPI. Otherwise, a complete review of systems is negative.  PAST MEDICAL HISTORY: Past Medical History:   Diagnosis Date   Abdominal pain 03/06/2016   Acute pericarditis 05/31/2013   Anxiety    Arthritis    Osteoarthritis   Breast cancer (HCC) 2009   right breast lumpectomy with rad tx   Colon cancer (HCC)    surgery with chemo and rad tx   Complication of anesthesia    GERD (gastroesophageal reflux disease)    Heart palpitations    History of hiatal hernia    History of kidney stones    History of thyroid  cancer 09/18/2008   Qualifier: Diagnosis of   By: Wynetta, CMA, Carol       Hypothyroidism    Liver disease    Liver nodule    s/p negative biopsy   Malignant neoplasm of thyroid  gland (HCC) 2002   s/p surgery and XRT   Osteoporosis    Other and unspecified hyperlipidemia    Palpitations    Personal history of chemotherapy    Personal history of malignant neoplasm of large intestine    carcinoma - cecum, s/p right laparoscopic colectomy - s/p chemotherapy and XRT   Personal history of radiation therapy    Pneumonia 2019   PONV (postoperative nausea and vomiting)    Pure hypercholesterolemia    Unspecified hereditary and idiopathic peripheral neuropathy     PAST SURGICAL HISTORY: Past Surgical History:  Procedure Laterality Date   APPENDECTOMY  1985   BREAST BIOPSY Right 2009   positive   BREAST BIOPSY Right 2009   negative   BREAST LUMPECTOMY Right 2009   breast cancer   CHOLECYSTECTOMY  1995   COLONOSCOPY     COLONOSCOPY WITH PROPOFOL  N/A 10/29/2017   Procedure: COLONOSCOPY WITH PROPOFOL ;  Surgeon: Viktoria,  Lamar DASEN, MD;  Location: [facility] ENDOSCOPY;  Service: Endoscopy;  Laterality: N/A;   DILATION AND CURETTAGE OF UTERUS  1990   DILATION AND CURETTAGE, DIAGNOSTIC / THERAPEUTIC  1990   ESOPHAGOGASTRODUODENOSCOPY     ESOPHAGOGASTRODUODENOSCOPY (EGD) WITH PROPOFOL  N/A 10/29/2017   Procedure: ESOPHAGOGASTRODUODENOSCOPY (EGD) WITH PROPOFOL ;  Surgeon: Viktoria Lamar DASEN, MD;  Location: [facility] ENDOSCOPY;  Service: Endoscopy;  Laterality: N/A;   INCISION AND DRAINAGE  PERIRECTAL ABSCESS N/A 06/17/2020   Procedure: IRRIGATION AND DEBRIDEMENT PERIRECTAL ABSCESS;  Surgeon: Jordis Laneta FALCON, MD;  Location: [facility] ORS;  Service: General;  Laterality: N/A;   IR BONE MARROW BIOPSY & ASPIRATION  07/09/2021   IR BONE MARROW BIOPSY & ASPIRATION  09/03/2023   LAPAROSCOPIC PARTIAL COLECTOMY     stage 3-C carcinoma of the cecum, s/p chemotherapy and xrt   LITHOTRIPSY     SIGMOIDOSCOPY  08/26/1993   THYROID  LOBECTOMY  2002   s/p XRT   TOTAL HIP ARTHROPLASTY Right 10/24/2019   Procedure: TOTAL HIP ARTHROPLASTY;  Surgeon: Edie Norleen PARAS, MD;  Location: [facility] ORS;  Service: Orthopedics;  Laterality: Right;    FAMILY HISTORY: Family History  Problem Relation Age of Onset   Stroke Mother        91s   Alzheimer's disease Mother    Lung cancer Father    Prostate cancer Father    Cancer Father        Colon   Colon cancer Father    Breast cancer Sister        44's   Lung cancer Sister    Breast cancer Maternal Aunt     ADVANCED DIRECTIVES (Y/N):  N  HEALTH MAINTENANCE: Social History   Tobacco Use   Smoking status: Never   Smokeless tobacco: Never  Vaping Use   Vaping status: Never Used  Substance Use Topics   Alcohol use: No    Alcohol/week: 0.0 standard drinks of alcohol   Drug use: No     Colonoscopy:  PAP:  Bone density:  Lipid panel:  Allergies  Allergen Reactions   Demeclocycline Other (See Comments)    Throat swells   Sulfa  Antibiotics Other (See Comments)    Other reaction(s): Other (See Comments) Throat swells Other reaction(s): Other (See Comments) Throat swells Throat swells Other reaction(s): Other (See Comments) Throat swells Other reaction(s): Unknown Other reaction(s): Other (See Comments) Throat swells Other reaction(s): Other (See Comments) Other reaction(s): Other (See Comments) Throat swellsOther reaction(s): Other (See Comments) Throat swells   Tetracyclines & Related Other (See Comments)    Throat swells    Chlordiazepoxide    Dicyclomine    Levofloxacin  Diarrhea    Severe diarrhea   Augmentin  [Amoxicillin -Pot Clavulanate] Diarrhea   Bentyl [Dicyclomine Hcl]     unkn   Ciprofloxacin  Diarrhea   Codeine Other (See Comments)    dizziness     Dicyclomine Hcl Other (See Comments)    unkn   Epinephrine      Speeds heart up - panic    Flagyl  [Metronidazole ] Nausea And Vomiting   Librax [Chlordiazepoxide-Clidinium]     unkn   Novocain [Procaine] Other (See Comments)    Shaky   Phenobarbital     unkn   Prednisone      Nervous     Tramadol Other (See Comments)    Sick feeling    Current Outpatient Medications  Medication Sig Dispense Refill   clonazePAM  (KLONOPIN ) 0.5 MG tablet Take 1 tablet (0.5 mg total) by mouth 3 (three) times daily as  needed for anxiety. 30 tablet 0   escitalopram  (LEXAPRO ) 20 MG tablet Take 20 mg by mouth every morning.      levothyroxine  (SYNTHROID ) 75 MCG tablet Take 1 tablet (75 mcg total) by mouth daily. 90 tablet 0   nortriptyline  (PAMELOR ) 25 MG capsule Take 25 mg by mouth 2 (two) times daily.     olutasidenib  (REZLIDHIA ) 150 MG capsule Take 1 capsule (150 mg total) by mouth daily. Take on an empty stomach at least 1 hour before or 2 hours after a meal. 30 capsule 2   pantoprazole  (PROTONIX ) 40 MG tablet TAKE ONE TABLET BY MOUTH TWICE DAILY BEFORE A MEAL 180 tablet 1   potassium chloride  (KLOR-CON  M) 10 MEQ tablet TAKE 1 TABLET BY MOUTH TWICE DAILY 60 tablet 2   rosuvastatin  (CRESTOR ) 5 MG tablet Take 1 tablet (5 mg total) by mouth daily. 30 tablet 2   valACYclovir  (VALTREX ) 500 MG tablet TAKE 1 TABLET BY MOUTH DAILY 30 tablet 2   Vitamin D , Ergocalciferol , (DRISDOL ) 1.25 MG (50000 UNIT) CAPS capsule Take 1 capsule (50,000 Units total) by mouth every 7 (seven) days. 12 capsule 1   Azelastine -Fluticasone  137-50 MCG/ACT SUSP SPRAY 2 SPRAYS INTO EACH NOSTRIL EVERY DAY 23 g 2   prochlorperazine  (COMPAZINE ) 10 MG tablet TAKE ONE TABLET BY MOUTH EVERY 6 HOURS AS  NEEDED FOR NAUSEA / VOMITING (Patient not taking: Reported on 02/15/2024) 30 tablet 3   No current facility-administered medications for this visit.    OBJECTIVE: Vitals:   02/15/24 1419  BP: 112/78  Pulse: 90  Resp: 18  Temp: 97.9 F (36.6 C)  SpO2: 99%     Body mass index is 20.6 kg/m.    ECOG FS:0 - Asymptomatic  General: Well-developed, well-nourished, no acute distress. Eyes: Pink conjunctiva, anicteric sclera. HEENT: Normocephalic, moist mucous membranes. Lungs: No audible wheezing or coughing. Heart: Regular rate and rhythm. Abdomen: Soft, nontender, no obvious distention. Musculoskeletal: No edema, cyanosis, or clubbing. Neuro: Alert, answering all questions appropriately. Cranial nerves grossly intact. Skin: No rashes or petechiae noted. Psych: Normal affect.  LAB RESULTS:  Lab Results  Component Value Date   NA 141 11/23/2023   K 3.7 11/23/2023   CL 107 11/23/2023   CO2 26 11/23/2023   GLUCOSE 115 (H) 11/23/2023   BUN 19 11/23/2023   CREATININE 1.00 11/23/2023   CALCIUM  8.7 (L) 11/23/2023   PROT 6.4 (L) 11/23/2023   ALBUMIN 4.1 11/23/2023   AST 14 (L) 11/23/2023   ALT 33 11/23/2023   ALKPHOS 63 11/23/2023   BILITOT 0.9 11/23/2023   GFRNONAA 59 (L) 11/23/2023   GFRAA 54 (L) 10/26/2019    Lab Results  Component Value Date   WBC 3.5 (L) 11/23/2023   NEUTROABS 2.4 11/23/2023   HGB 11.0 (L) 11/23/2023   HCT 31.1 (L) 11/23/2023   MCV 103.0 (H) 11/23/2023   PLT 102 (L) 11/23/2023     STUDIES: No results found.    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  PLAN:    AML: Confirmed by bone marrow biopsy.  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  These mutations were not reported on her most recent cytogenetics report.  Patient's most recent bone marrow biopsy on Sep 03, 2023 revealed no morphologic evidence of  AML.  Prior to this, patient noted to have a 15% blast count in November 2023.  She last received  treatment with cycle 22 of Vidaza  on February 13, 2022.  Given her progressive disease and IDH-1 mutation, Vidaza  was discontinued and patient initially received 150 mg twice daily of Olutasidenib  (Rezlidhia ).  Although patient was previously dose reduced to treatment once a day, today she states that she continues 150 mg twice per day.  Continue treatment until intolerable side effects or progression of disease.  Return to clinic in 2 months with laboratory work and evaluation by clinical pharmacy.  Patient within return to clinic in 4 months for further evaluation and continuation of treatment.  If everything remains stable, will repeat valence bone marrow biopsy in May 2026.   Thrombocytopenia: Patient's platelet count has trended down slightly to 96, monitor.  Continue dose reduced treatment once per day as above. Leukopenia: Monic and unchanged.  Patient's total white blood cell count is 3.4.   Anemia: Chronic and unchanged.  Patient's hemoglobin is 11.1. Diarrhea: Resolved.  Continue Lomotil  as needed.   Nausea: Patient only takes Compazine  intermittently. Parkinson's: Continue treatment and management per neurology. Patient now on amitriptyline and carbidopa/levodopa.   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  Patient also received chemotherapy and likely received Adriamycin, but exact regimen is unknown.  She completed 5 years of hormonal therapy in approximately June 2015. Currently, she has no evidence of disease.  Her most recent mammogram on July 29, 2021 was reported as BI-RADS 2.   History of colon cancer: Patient also states that she received chemotherapy for this, possibly FOLFOX but again this is unknown. Hair  thinning: Improving.  Likely related to thyroid  dosing.   Patient expressed understanding and was in agreement with this plan. She also understands that She can call clinic at any time with any questions, concerns, or complaints.    Cancer Staging  History of breast cancer Staging form: Breast, AJCC 7th Edition - Clinical stage from 01/07/2016: Stage IA (T1c, N0, M0) - Signed by Jacobo Evalene PARAS, MD on 01/07/2016 Laterality: Right Estrogen receptor status: Positive Progesterone receptor status: Positive HER2 status: Negative   Evalene PARAS Jacobo, MD   02/15/2024 2:59 PM

## 2024-02-16 ENCOUNTER — Other Ambulatory Visit: Payer: Self-pay | Admitting: *Deleted

## 2024-02-16 ENCOUNTER — Other Ambulatory Visit

## 2024-02-16 ENCOUNTER — Other Ambulatory Visit: Payer: Self-pay | Admitting: Internal Medicine

## 2024-02-16 DIAGNOSIS — K760 Fatty (change of) liver, not elsewhere classified: Secondary | ICD-10-CM

## 2024-02-17 LAB — LIPID PANEL
Cholesterol: 128 mg/dL (ref 0–200)
HDL: 50 mg/dL (ref >39.00)
LDL Cholesterol: 64 mg/dL (ref 0–99)
NonHDL: 78.32
Total CHOL/HDL Ratio: 3
Triglycerides: 70 mg/dL (ref 0.0–149.0)
VLDL: 14 mg/dL (ref 0.0–40.0)

## 2024-02-18 ENCOUNTER — Ambulatory Visit: Payer: Self-pay | Admitting: Internal Medicine

## 2024-02-18 NOTE — Telephone Encounter (Signed)
 Copied from CRM [id number]. Topic: Clinical - Medication Refill >> Feb 18, 2024  2:52 PM Nessti S wrote: Medication: levothyroxine  (SYNTHROID ) 75 MCG tablet  Has the patient contacted their pharmacy? Yes (Agent: If no, request that the patient contact the pharmacy for the refill. If patient does not wish to contact the pharmacy document the reason why and proceed with request.) (Agent: If yes, when and what did the pharmacy advise?)  This is the patient's preferred pharmacy:  [facility] - [place], [place] - [address] [place] [address] Phone: [phone] Fax: [phone]  Is this the correct pharmacy for this prescription? Yes If no, delete pharmacy and type the correct one.   Has the prescription been filled recently? No  Is the patient out of the medication? No. Have 2 left  Has the patient been seen for an appointment in the last year OR does the patient have an upcoming appointment? Yes  Can we respond through MyChart? No  Agent: Please be advised that Rx refills may take up to 3 business days. We ask that you follow-up with your pharmacy.

## 2024-02-24 ENCOUNTER — Other Ambulatory Visit: Payer: Self-pay

## 2024-02-24 DIAGNOSIS — R498 Other voice and resonance disorders: Secondary | ICD-10-CM | POA: Diagnosis not present

## 2024-02-24 DIAGNOSIS — G5603 Carpal tunnel syndrome, bilateral upper limbs: Secondary | ICD-10-CM | POA: Diagnosis not present

## 2024-02-24 DIAGNOSIS — R531 Weakness: Secondary | ICD-10-CM | POA: Diagnosis not present

## 2024-02-24 DIAGNOSIS — G20A1 Parkinson's disease without dyskinesia, without mention of fluctuations: Secondary | ICD-10-CM | POA: Diagnosis not present

## 2024-02-24 DIAGNOSIS — R1310 Dysphagia, unspecified: Secondary | ICD-10-CM | POA: Diagnosis not present

## 2024-02-24 DIAGNOSIS — Z79899 Other long term (current) drug therapy: Secondary | ICD-10-CM | POA: Diagnosis not present

## 2024-02-24 DIAGNOSIS — R2689 Other abnormalities of gait and mobility: Secondary | ICD-10-CM | POA: Diagnosis not present

## 2024-02-24 DIAGNOSIS — R490 Dysphonia: Secondary | ICD-10-CM | POA: Diagnosis not present

## 2024-02-24 DIAGNOSIS — R258 Other abnormal involuntary movements: Secondary | ICD-10-CM | POA: Diagnosis not present

## 2024-02-24 NOTE — Progress Notes (Signed)
 Specialty Pharmacy Ongoing Clinical Assessment Note  Debra Hale is a 73 y.o. female who is being followed by the specialty pharmacy service for RxSp Oncology   Patient's specialty medication(s) reviewed today: Olutasidenib  (REZLIDHIA )   Missed doses in the last 4 weeks: 0   Patient/Caregiver did not have any additional questions or concerns.   Therapeutic benefit summary: Patient is achieving benefit   Adverse events/side effects summary: No adverse events/side effects   Patient's therapy is appropriate to: Continue    Goals Addressed             This Visit's Progress    Stabilization of disease   On track    Patient is on track. Patient will maintain adherence and adhere to provider and/or lab appointments. Patient remains stable, next bone marrow biopsy planned for May 2026.         Follow up: 6 months  Silvano LOISE Dolly Specialty Pharmacist

## 2024-02-24 NOTE — Progress Notes (Signed)
 Specialty Pharmacy Refill Coordination Note  Debra Hale is a 73 y.o. female contacted today regarding refills of specialty medication(s) Olutasidenib  (REZLIDHIA )   Patient requested Delivery   Delivery date: 03/02/24   Verified address: [address] [place] [address]   Medication will be filled on 03/01/24.

## 2024-02-29 ENCOUNTER — Encounter: Payer: Self-pay | Admitting: Internal Medicine

## 2024-02-29 ENCOUNTER — Ambulatory Visit (INDEPENDENT_AMBULATORY_CARE_PROVIDER_SITE_OTHER): Admitting: Internal Medicine

## 2024-02-29 VITALS — BP 110/68 | HR 86 | Temp 98.1°F | Ht 64.0 in | Wt 123.0 lb

## 2024-02-29 DIAGNOSIS — F419 Anxiety disorder, unspecified: Secondary | ICD-10-CM | POA: Diagnosis not present

## 2024-02-29 DIAGNOSIS — E7800 Pure hypercholesterolemia, unspecified: Secondary | ICD-10-CM

## 2024-02-29 DIAGNOSIS — K219 Gastro-esophageal reflux disease without esophagitis: Secondary | ICD-10-CM

## 2024-02-29 DIAGNOSIS — R197 Diarrhea, unspecified: Secondary | ICD-10-CM | POA: Diagnosis not present

## 2024-02-29 DIAGNOSIS — D61818 Other pancytopenia: Secondary | ICD-10-CM

## 2024-02-29 DIAGNOSIS — E559 Vitamin D deficiency, unspecified: Secondary | ICD-10-CM

## 2024-02-29 DIAGNOSIS — I5032 Chronic diastolic (congestive) heart failure: Secondary | ICD-10-CM | POA: Diagnosis not present

## 2024-02-29 DIAGNOSIS — C9200 Acute myeloblastic leukemia, not having achieved remission: Secondary | ICD-10-CM | POA: Diagnosis not present

## 2024-02-29 DIAGNOSIS — K760 Fatty (change of) liver, not elsewhere classified: Secondary | ICD-10-CM

## 2024-02-29 DIAGNOSIS — Z853 Personal history of malignant neoplasm of breast: Secondary | ICD-10-CM

## 2024-02-29 DIAGNOSIS — E039 Hypothyroidism, unspecified: Secondary | ICD-10-CM | POA: Diagnosis not present

## 2024-02-29 DIAGNOSIS — Z0000 Encounter for general adult medical examination without abnormal findings: Secondary | ICD-10-CM

## 2024-02-29 DIAGNOSIS — R002 Palpitations: Secondary | ICD-10-CM | POA: Diagnosis not present

## 2024-02-29 DIAGNOSIS — R319 Hematuria, unspecified: Secondary | ICD-10-CM | POA: Diagnosis not present

## 2024-02-29 DIAGNOSIS — Z85850 Personal history of malignant neoplasm of thyroid: Secondary | ICD-10-CM

## 2024-02-29 DIAGNOSIS — F439 Reaction to severe stress, unspecified: Secondary | ICD-10-CM

## 2024-02-29 MED ORDER — ROSUVASTATIN CALCIUM 5 MG PO TABS: 5.0000 mg | ORAL_TABLET | Freq: Every day | ORAL | 2 refills | Status: DC

## 2024-02-29 MED ORDER — PANTOPRAZOLE SODIUM 40 MG PO TBEC: 40.0000 mg | DELAYED_RELEASE_TABLET | Freq: Two times a day (BID) | ORAL | 1 refills | Status: AC

## 2024-02-29 MED ORDER — POTASSIUM CHLORIDE CRYS ER 10 MEQ PO TBCR: 10.0000 meq | EXTENDED_RELEASE_TABLET | Freq: Two times a day (BID) | ORAL | 2 refills | Status: AC

## 2024-02-29 NOTE — Progress Notes (Signed)
 Subjective:    Patient ID: Debra Hale, female    DOB: 1950/08/18, 73 y.o.   MRN: [id number]  Patient here for  Chief Complaint  Patient presents with   Medical Management of Chronic Issues   Annual Exam    HPI Here for a physical exam. Just had f/u with neurology - 02/24/24 - f/u parkinson's disease. Recommended to discontinue requip due to nausea and sleep disturbance. Recommended balance exercises. Monitor dysphagia. Had f/u Dr Jacobo - AML - continue rezlidhia . Stable. F/u thrombocytopenia and leukoepnia - stable. Evaluated acute care 02/05/24 - palpitations. Referred to cardiology. Recommended to start carvedilol. Did not start. Has appt tomorrow. Notices being a little more shaky and nervous in am. Calms down after about 30 minutes. Has appt with cardiology tomorrow. Discussed stress. Has f/u with psychiatry this week. Has had issues with intermittent diarrhea. Worse when upset/stressed. May have loose stool 1-2x/day.    Past Medical History:  Diagnosis Date   Abdominal pain 03/06/2016   Acute pericarditis 05/31/2013   Anxiety    Arthritis    Osteoarthritis   Breast cancer (HCC) 2009   right breast lumpectomy with rad tx   Colon cancer (HCC)    surgery with chemo and rad tx   Complication of anesthesia    GERD (gastroesophageal reflux disease)    Heart palpitations    History of hiatal hernia    History of kidney stones    History of thyroid  cancer 09/18/2008   Qualifier: Diagnosis of   By: Wynetta, CMA, Carol       Hypothyroidism    Liver disease    Liver nodule    s/p negative biopsy   Malignant neoplasm of thyroid  gland (HCC) 2002   s/p surgery and XRT   Osteoporosis    Other and unspecified hyperlipidemia    Palpitations    Personal history of chemotherapy    Personal history of malignant neoplasm of large intestine    carcinoma - cecum, s/p right laparoscopic colectomy - s/p chemotherapy and XRT   Personal history of radiation therapy    Pneumonia 2019    PONV (postoperative nausea and vomiting)    Pure hypercholesterolemia    Unspecified hereditary and idiopathic peripheral neuropathy    Past Surgical History:  Procedure Laterality Date   APPENDECTOMY  1985   BREAST BIOPSY Right 2009   positive   BREAST BIOPSY Right 2009   negative   BREAST LUMPECTOMY Right 2009   breast cancer   CHOLECYSTECTOMY  1995   COLONOSCOPY     COLONOSCOPY WITH PROPOFOL  N/A 10/29/2017   Procedure: COLONOSCOPY WITH PROPOFOL ;  Surgeon: Viktoria Lamar DASEN, MD;  Location: [facility] ENDOSCOPY;  Service: Endoscopy;  Laterality: N/A;   DILATION AND CURETTAGE OF UTERUS  1990   DILATION AND CURETTAGE, DIAGNOSTIC / THERAPEUTIC  1990   ESOPHAGOGASTRODUODENOSCOPY     ESOPHAGOGASTRODUODENOSCOPY (EGD) WITH PROPOFOL  N/A 10/29/2017   Procedure: ESOPHAGOGASTRODUODENOSCOPY (EGD) WITH PROPOFOL ;  Surgeon: Viktoria Lamar DASEN, MD;  Location: [facility] ENDOSCOPY;  Service: Endoscopy;  Laterality: N/A;   INCISION AND DRAINAGE PERIRECTAL ABSCESS N/A 06/17/2020   Procedure: IRRIGATION AND DEBRIDEMENT PERIRECTAL ABSCESS;  Surgeon: Jordis Laneta FALCON, MD;  Location: [facility] ORS;  Service: General;  Laterality: N/A;   IR BONE MARROW BIOPSY & ASPIRATION  07/09/2021   IR BONE MARROW BIOPSY & ASPIRATION  09/03/2023   LAPAROSCOPIC PARTIAL COLECTOMY     stage 3-C carcinoma of the cecum, s/p chemotherapy and xrt   LITHOTRIPSY  SIGMOIDOSCOPY  08/26/1993   THYROID  LOBECTOMY  2002   s/p XRT   TOTAL HIP ARTHROPLASTY Right 10/24/2019   Procedure: TOTAL HIP ARTHROPLASTY;  Surgeon: Edie Norleen PARAS, MD;  Location: [facility] ORS;  Service: Orthopedics;  Laterality: Right;   Family History  Problem Relation Age of Onset   Stroke Mother        20s   Alzheimer's disease Mother    Lung cancer Father    Prostate cancer Father    Cancer Father        Colon   Colon cancer Father    Breast cancer Sister        35's   Lung cancer Sister    Breast cancer Maternal Aunt    Social History   Socioeconomic History   Marital  status: Single    Spouse name: Not on file   Number of children: 0   Years of education: Not on file   Highest education level: Not on file  Occupational History   Occupation: Retired  Tobacco Use   Smoking status: Never   Smokeless tobacco: Never  Vaping Use   Vaping status: Never Used  Substance and Sexual Activity   Alcohol use: No    Alcohol/week: 0.0 standard drinks of alcohol   Drug use: No   Sexual activity: Not Currently  Other Topics Concern   Not on file  Social History Narrative   Lives alone and occasionally has friend over.   Social Drivers of Corporate Investment Banker Strain: Low Risk  (03/01/2024)   Received from [facility]   Overall Financial Resource Strain (CARDIA)    Difficulty of Paying Living Expenses: Not very hard  Food Insecurity: No Food Insecurity (03/01/2024)   Received from [facility]   Hunger Vital Sign    Within the past 12 months, you worried that your food would run out before you got the money to buy more.: Never true    Within the past 12 months, the food you bought just didn't last and you didn't have money to get more.: Never true  Transportation Needs: No Transportation Needs (03/01/2024)   Received from [facility] - Transportation    In the past 12 months, has lack of transportation kept you from medical appointments or from getting medications?: No    Lack of Transportation (Non-Medical): No  Physical Activity: Inactive (12/27/2023)   Exercise Vital Sign    Days of Exercise per Week: 0 days    Minutes of Exercise per Session: 0 min  Stress: Stress Concern Present (12/27/2023)   [employer] of Occupational Health - Occupational Stress Questionnaire    Feeling of Stress: To some extent  Social Connections: Moderately Isolated (12/27/2023)   Social Connection and Isolation Panel    Frequency of Communication with Friends and Family: More than three times a week     Frequency of Social Gatherings with Friends and Family: Once a week    Attends Religious Services: More than 4 times per year    Active Member of [employer] or Organizations: No    Attends Banker Meetings: Never    Marital Status: Divorced     Review of Systems  Constitutional:  Negative for appetite change and unexpected weight change.  HENT:  Negative for congestion, sinus pressure and sore throat.   Eyes:  Negative for pain and visual disturbance.  Respiratory:  Negative for cough, chest tightness and shortness of  breath.   Cardiovascular:  Positive for palpitations. Negative for chest pain and leg swelling.  Gastrointestinal:  Negative for abdominal pain, diarrhea, nausea and vomiting.  Genitourinary:  Negative for difficulty urinating and dysuria.  Musculoskeletal:  Negative for joint swelling and myalgias.  Skin:  Negative for color change and rash.  Neurological:  Negative for dizziness and headaches.  Hematological:  Negative for adenopathy. Does not bruise/bleed easily.  Psychiatric/Behavioral:  Negative for dysphoric mood.        Increased stress/anxiety as outlined.        Objective:     BP 110/68   Pulse 86   Temp 98.1 F (36.7 C) (Oral)   Ht 5' 4 (1.626 m)   Wt 123 lb (55.8 kg)   SpO2 98%   BMI 21.11 kg/m  Wt Readings from Last 3 Encounters:  02/29/24 123 lb (55.8 kg)  02/15/24 120 lb (54.4 kg)  12/27/23 124 lb (56.2 kg)    Physical Exam Vitals reviewed.  Constitutional:      General: She is not in acute distress.    Appearance: Normal appearance. She is well-developed.  HENT:     Head: Normocephalic and atraumatic.     Right Ear: External ear normal.     Left Ear: External ear normal.     Mouth/Throat:     Pharynx: No oropharyngeal exudate or posterior oropharyngeal erythema.  Eyes:     General: No scleral icterus.       Right eye: No discharge.        Left eye: No discharge.     Conjunctiva/sclera: Conjunctivae normal.  Neck:      Thyroid : No thyromegaly.  Cardiovascular:     Rate and Rhythm: Normal rate and regular rhythm.  Pulmonary:     Effort: No tachypnea, accessory muscle usage or respiratory distress.     Breath sounds: Normal breath sounds. No decreased breath sounds or wheezing.  Chest:  Breasts:    Right: No inverted nipple, mass, nipple discharge or tenderness (no axillary adenopathy).     Left: No inverted nipple, mass, nipple discharge or tenderness (no axilarry adenopathy).  Abdominal:     General: Bowel sounds are normal.     Palpations: Abdomen is soft.     Tenderness: There is no abdominal tenderness.  Musculoskeletal:        General: No swelling or tenderness.     Cervical back: Neck supple. No tenderness.  Lymphadenopathy:     Cervical: No cervical adenopathy.  Skin:    Findings: No erythema or rash.  Neurological:     Mental Status: She is alert and oriented to person, place, and time.  Psychiatric:        Mood and Affect: Mood normal.        Behavior: Behavior normal.         Outpatient Encounter Medications as of 02/29/2024  Medication Sig   Azelastine -Fluticasone  137-50 MCG/ACT SUSP SPRAY 2 SPRAYS INTO EACH NOSTRIL EVERY DAY   carbidopa-levodopa (SINEMET IR) 25-100 MG tablet Take by mouth.   clonazePAM  (KLONOPIN ) 0.5 MG tablet Take 1 tablet (0.5 mg total) by mouth 3 (three) times daily as needed for anxiety.   escitalopram  (LEXAPRO ) 20 MG tablet Take 20 mg by mouth every morning.    levothyroxine  (SYNTHROID ) 75 MCG tablet Take 1 tablet (75 mcg total) by mouth daily.   nortriptyline  (PAMELOR ) 25 MG capsule Take 25 mg by mouth 2 (two) times daily.   olutasidenib  (REZLIDHIA ) 150 MG capsule Take  1 capsule (150 mg total) by mouth daily. Take on an empty stomach at least 1 hour before or 2 hours after a meal.   prochlorperazine  (COMPAZINE ) 10 MG tablet TAKE ONE TABLET BY MOUTH EVERY 6 HOURS AS NEEDED FOR NAUSEA / VOMITING   rOPINIRole (REQUIP) 0.25 MG tablet Take  0.25 mg twice a day  for 3 days, then increase to 0.25 mg three time a day   valACYclovir  (VALTREX ) 500 MG tablet TAKE 1 TABLET BY MOUTH DAILY   Vitamin D , Ergocalciferol , (DRISDOL ) 1.25 MG (50000 UNIT) CAPS capsule Take 1 capsule (50,000 Units total) by mouth every 7 (seven) days.   pantoprazole  (PROTONIX ) 40 MG tablet Take 1 tablet (40 mg total) by mouth 2 (two) times daily before a meal.   potassium chloride  (KLOR-CON  M) 10 MEQ tablet Take 1 tablet (10 mEq total) by mouth 2 (two) times daily.   rosuvastatin  (CRESTOR ) 5 MG tablet Take 1 tablet (5 mg total) by mouth daily.   [DISCONTINUED] pantoprazole  (PROTONIX ) 40 MG tablet TAKE ONE TABLET BY MOUTH TWICE DAILY BEFORE A MEAL   [DISCONTINUED] potassium chloride  (KLOR-CON  M) 10 MEQ tablet TAKE 1 TABLET BY MOUTH TWICE DAILY   [DISCONTINUED] rosuvastatin  (CRESTOR ) 5 MG tablet Take 1 tablet (5 mg total) by mouth daily.   No facility-administered encounter medications on file as of 02/29/2024.     Lab Results  Component Value Date   WBC 3.5 (L) 11/23/2023   HGB 11.0 (L) 11/23/2023   HCT 31.1 (L) 11/23/2023   PLT 102 (L) 11/23/2023   GLUCOSE 88 02/29/2024   CHOL 180 02/29/2024   TRIG 77.0 02/29/2024   HDL 56.50 02/29/2024   LDLDIRECT 126.0 07/11/2015   LDLCALC 108 (H) 02/29/2024   ALT 20 02/29/2024   AST 9 02/29/2024   NA 142 02/29/2024   K 3.7 02/29/2024   CL 105 02/29/2024   CREATININE 0.90 02/29/2024   BUN 14 02/29/2024   CO2 30 02/29/2024   TSH 3.56 02/29/2024   INR 1.3 (H) 04/04/2022   HGBA1C 5.3 12/10/2016    IR BONE MARROW BIOPSY & ASPIRATION Addendum Date: 09/23/2023 ADDENDUM REPORT: 09/23/2023 09:19 FLUOROSCOPY: Reference air kerma: 1 mGy Electronically Signed   By: Ami Bellman D.O.   On: 09/23/2023 09:19   Result Date: 09/23/2023 INDICATION: 73 year old female with follow-up AML, referred for bone marrow biopsy EXAM: IR BONE MARRO BIOPY AND ASPIRATION MEDICATIONS: None. ANESTHESIA/SEDATION: Moderate (conscious) sedation was employed during  this procedure. A total of Versed  1 mg and Fentanyl  50 mcg was administered intravenously. Moderate Sedation Time: 10 minutes. The patient's level of consciousness and vital signs were monitored continuously by radiology nursing throughout the procedure under my direct supervision. FLUOROSCOPY TIME:  Fluoroscopy Time:  (1 mGy). COMPLICATIONS: None immediate. PROCEDURE: Informed written consent was obtained from the patient after a thorough discussion of the procedural risks, benefits and alternatives. All questions were addressed. Maximal Sterile Barrier Technique was utilized including caps, mask, sterile gowns, sterile gloves, sterile drape, hand hygiene and skin antiseptic. A timeout was performed prior to the initiation of the procedure. The procedure risks, benefits, and alternatives were explained to the patient. Questions regarding the procedure were encouraged and answered. The patient understands and consents to the procedure. Patient was positioned prone under the image intensifier. Physical exam was used to determine the L5- S1 level, and then the posterior pelvis was prepped with Chlorhexidine  in a sterile fashion. A sterile drape was applied covering the operative field. Sterile gown/sterile gloves were used  for the procedure. Local anesthesia was provided with 1% Lidocaine . Posterior left iliac bone was targeted for biopsy. The skin and subcutaneous tissues were infiltrated with 1% lidocaine  without epinephrine . A small stab incision was made with an 11 blade scalpel, and an 11 gauge Murphy needle was advanced with fluoro guidance to the posterior cortex. Manual forced was used to advance the needle through the posterior cortex and the stylet was removed. A bone marrow aspirate was retrieved and passed to a cytotechnologist in the room. The Murphy needle was then advanced without the stylet for a core biopsy. The core biopsy was retrieved and also passed to a cytotechnologist. Manual pressure was used  for hemostasis and a sterile dressing was placed. No complications were encountered no significant blood loss was encountered. Patient tolerated the procedure well and remained hemodynamically stable throughout. IMPRESSION: Status post image guided bone marrow biopsy. Signed, Ami RAMAN. Alona ROSALEA GRAVER, RPVI [facility] Radiology Electronically Signed: By: Ami Alona D.O. On: 09/03/2023 10:28       Assessment & Plan:  Routine general medical examination at a health care facility  Acute myeloid leukemia not having achieved remission [facility]) Assessment & Plan: Seeing Dr Jacobo - f/u AML - on rezlidhia .  Following cbc.   Orders: -     Lipid panel -     Hepatic function panel  Vitamin D  deficiency Assessment & Plan: Check vitamin D  level.   Orders: -     VITAMIN D  25 Hydroxy (Vit-D Deficiency, Fractures)  History of thyroid  cancer Assessment & Plan: Continue synthroid . Follow tsh.   Orders: -     TSH  Heart palpitations -     EKG 12-Lead  Palpitations Assessment & Plan: Recently evaluated as outlined. Recommended to start carvedilol. Has not started yet. Check labs, including electrolytes, tsh and magnesium . Has f/u appt with cardiology tomorrow. EKG - SR with no acute ischemic changes.   Orders: -     Basic metabolic panel with GFR -     Magnesium   Stress Assessment & Plan: Increased stress/anxiety as outlined. Discussed. Sees Dr Claryce. Has f/u this week.    Pancytopenia (HCC) Assessment & Plan: Followed by oncology.    Anxiety Assessment & Plan: Continue f/u with Dr Claryce.  Has f/u appt scheduled this week. Discussed.    Chronic diastolic CHF (congestive heart failure) (HCC) Assessment & Plan: 2D echo on 09/05/2020 showed EF 60-65% with grade 1 diastolic dysfunction. No evidence of volume overload on exam.  Breathing stable. Palpitations as outlined. Carvedilol rx has been sent in. Keep f/u with cardiology  tomorrow. Question of need for f/u ECHO. EKG as outlind.    Diarrhea, unspecified type Assessment & Plan: Overall appears to be stable. Continue f/u with GI.    Gastroesophageal reflux disease, unspecified whether esophagitis present Assessment & Plan: Continue protonix . No upper symptoms reported.    Health care maintenance Assessment & Plan: Physical today 02/29/24.  Mammogram 07/29/21 - Birads I.  Wants to hold on mammogram. Colonoscopy 10/2017 - one polyp - transverse colon.    Hematuria, unspecified type Assessment & Plan: Needs f/u urinalysis.    History of breast cancer Assessment & Plan: Mammogram 07/29/21 - Birads II. Due f/u mammogram.  Discussed today. She has declined f/u mammogram.    Hypercholesterolemia Assessment & Plan: Follow lipid panel.    Hypomagnesemia Assessment & Plan: Check magnesium  level.    Hypothyroidism, unspecified type Assessment & Plan: Continues on synthroid . Follow tsh.  Other orders -     Pantoprazole  Sodium; Take 1 tablet (40 mg total) by mouth 2 (two) times daily before a meal.  Dispense: 180 tablet; Refill: 1 -     Potassium Chloride  Crys ER; Take 1 tablet (10 mEq total) by mouth 2 (two) times daily.  Dispense: 60 tablet; Refill: 2 -     Rosuvastatin  Calcium ; Take 1 tablet (5 mg total) by mouth daily.  Dispense: 30 tablet; Refill: 2     Allena Hamilton, MD

## 2024-03-01 ENCOUNTER — Other Ambulatory Visit: Payer: Self-pay

## 2024-03-01 ENCOUNTER — Ambulatory Visit: Payer: Self-pay | Admitting: Internal Medicine

## 2024-03-01 DIAGNOSIS — E782 Mixed hyperlipidemia: Secondary | ICD-10-CM | POA: Diagnosis not present

## 2024-03-01 DIAGNOSIS — R002 Palpitations: Secondary | ICD-10-CM | POA: Diagnosis not present

## 2024-03-01 DIAGNOSIS — R9431 Abnormal electrocardiogram [ECG] [EKG]: Secondary | ICD-10-CM | POA: Diagnosis not present

## 2024-03-01 LAB — HEPATIC FUNCTION PANEL
ALT: 20 U/L (ref 0–35)
AST: 9 U/L (ref 0–37)
Albumin: 3.9 g/dL (ref 3.5–5.2)
Alkaline Phosphatase: 70 U/L (ref 39–117)
Bilirubin, Direct: 0.1 mg/dL (ref 0.0–0.3)
Total Bilirubin: 0.5 mg/dL (ref 0.2–1.2)
Total Protein: 6 g/dL (ref 6.0–8.3)

## 2024-03-01 LAB — BASIC METABOLIC PANEL WITH GFR
BUN: 14 mg/dL (ref 6–23)
CO2: 30 meq/L (ref 19–32)
Calcium: 8.8 mg/dL (ref 8.4–10.5)
Chloride: 105 meq/L (ref 96–112)
Creatinine, Ser: 0.9 mg/dL (ref 0.40–1.20)
GFR: 63.07 mL/min (ref >60.00)
Glucose, Bld: 88 mg/dL (ref 70–99)
Potassium: 3.7 meq/L (ref 3.5–5.1)
Sodium: 142 meq/L (ref 135–145)

## 2024-03-01 LAB — LIPID PANEL
Cholesterol: 180 mg/dL (ref 0–200)
HDL: 56.5 mg/dL (ref >39.00)
LDL Cholesterol: 108 mg/dL — ABNORMAL HIGH (ref 0–99)
NonHDL: 123.28
Total CHOL/HDL Ratio: 3
Triglycerides: 77 mg/dL (ref 0.0–149.0)
VLDL: 15.4 mg/dL (ref 0.0–40.0)

## 2024-03-01 LAB — TSH: TSH: 3.56 u[IU]/mL (ref 0.35–5.50)

## 2024-03-01 LAB — MAGNESIUM: Magnesium: 1.6 mg/dL (ref 1.5–2.5)

## 2024-03-01 LAB — VITAMIN D 25 HYDROXY (VIT D DEFICIENCY, FRACTURES): VITD: 32.87 ng/mL (ref 30.00–100.00)

## 2024-03-05 ENCOUNTER — Encounter: Payer: Self-pay | Admitting: Internal Medicine

## 2024-03-05 ENCOUNTER — Other Ambulatory Visit: Payer: Self-pay | Admitting: Internal Medicine

## 2024-03-05 DIAGNOSIS — R319 Hematuria, unspecified: Secondary | ICD-10-CM

## 2024-03-05 NOTE — Assessment & Plan Note (Signed)
 Check vitamin D  level

## 2024-03-05 NOTE — Progress Notes (Signed)
Order placed for f/u urine 

## 2024-03-05 NOTE — Assessment & Plan Note (Signed)
 Recently evaluated as outlined. Recommended to start carvedilol. Has not started yet. Check labs, including electrolytes, tsh and magnesium . Has f/u appt with cardiology tomorrow. EKG - SR with no acute ischemic changes.

## 2024-03-05 NOTE — Assessment & Plan Note (Signed)
 Continue synthroid. Follow tsh.

## 2024-03-05 NOTE — Assessment & Plan Note (Signed)
 Follow lipid panel.

## 2024-03-05 NOTE — Assessment & Plan Note (Signed)
 Mammogram 07/29/21 - Birads II. Due f/u mammogram.  Discussed today. She has declined f/u mammogram.

## 2024-03-05 NOTE — Assessment & Plan Note (Signed)
 2D echo on 09/05/2020 showed EF 60-65% with grade 1 diastolic dysfunction. No evidence of volume overload on exam.  Breathing stable. Palpitations as outlined. Carvedilol rx has been sent in. Keep f/u with cardiology tomorrow. Question of need for f/u ECHO. EKG as outlind.

## 2024-03-05 NOTE — Assessment & Plan Note (Signed)
 Seeing Dr Adrian Alba - f/u AML - on rezlidhia.  Following cbc.

## 2024-03-05 NOTE — Assessment & Plan Note (Signed)
Continue protonix.  No upper symptoms reported.

## 2024-03-05 NOTE — Assessment & Plan Note (Signed)
 Increased stress/anxiety as outlined. Discussed. Sees Dr Claryce. Has f/u this week.

## 2024-03-05 NOTE — Assessment & Plan Note (Signed)
Needs f/u urinalysis.    

## 2024-03-05 NOTE — Assessment & Plan Note (Signed)
 Check magnesium level

## 2024-03-05 NOTE — Assessment & Plan Note (Signed)
 Continues on synthroid. Follow tsh.

## 2024-03-05 NOTE — Assessment & Plan Note (Addendum)
 Physical today 02/29/24.  Mammogram 07/29/21 - Birads I.  Wants to hold on mammogram. Colonoscopy 10/2017 - one polyp - transverse colon.

## 2024-03-05 NOTE — Assessment & Plan Note (Signed)
 Continue f/u with Dr Claryce.  Has f/u appt scheduled this week. Discussed.

## 2024-03-05 NOTE — Assessment & Plan Note (Signed)
 Followed by oncology

## 2024-03-05 NOTE — Assessment & Plan Note (Signed)
 Overall appears to be stable. Continue f/u with GI.

## 2024-03-07 DIAGNOSIS — R002 Palpitations: Secondary | ICD-10-CM | POA: Diagnosis not present

## 2024-03-08 ENCOUNTER — Other Ambulatory Visit: Payer: Self-pay | Admitting: Internal Medicine

## 2024-03-08 MED ORDER — ROSUVASTATIN CALCIUM 5 MG PO TABS: 5.0000 mg | ORAL_TABLET | Freq: Every day | ORAL | 2 refills | Status: AC

## 2024-03-08 NOTE — Progress Notes (Signed)
Rx sent in for crestor

## 2024-03-09 ENCOUNTER — Telehealth: Payer: Self-pay | Admitting: Pharmacy Technician

## 2024-03-09 NOTE — Telephone Encounter (Signed)
 Oral Oncology Patient Advocate Encounter  Was successful in securing patient a $10,000 grant from [facility] to provide copayment coverage for REZLIDHIA .  This will keep the out of pocket expense at $0.     [employer] ID: [id number]   The billing information is as follows and has been shared with [facility].    RxBin: [id number] PCN: [id number] Member ID: [id number] Group ID: [id number] Dates of Eligibility: 02/08/2024 through 02/06/2025  Fund:  Acute Myeloid Leukemia  Ande Therrell (Patty) Chet Burnet, CPhT  [facility], Zelda Salmon, [facility] - Oral Chemotherapy Patient Advocate Specialist III Phone: [phone]  Fax: [phone]

## 2024-03-10 ENCOUNTER — Other Ambulatory Visit (HOSPITAL_COMMUNITY): Payer: Self-pay

## 2024-03-13 ENCOUNTER — Other Ambulatory Visit

## 2024-03-13 DIAGNOSIS — R319 Hematuria, unspecified: Secondary | ICD-10-CM

## 2024-03-14 LAB — URINALYSIS, ROUTINE W REFLEX MICROSCOPIC
Bilirubin Urine: NEGATIVE
Ketones, ur: NEGATIVE
Leukocytes,Ua: NEGATIVE
Nitrite: NEGATIVE
RBC / HPF: NONE SEEN (ref 0–?)
Specific Gravity, Urine: 1.02 (ref 1.000–1.030)
Urine Glucose: NEGATIVE
Urobilinogen, UA: 0.2 (ref 0.0–1.0)
pH: 6.5 (ref 5.0–8.0)

## 2024-03-16 ENCOUNTER — Ambulatory Visit: Payer: Self-pay | Admitting: Internal Medicine

## 2024-03-22 ENCOUNTER — Other Ambulatory Visit: Payer: Self-pay | Admitting: Oncology

## 2024-03-22 ENCOUNTER — Other Ambulatory Visit: Payer: Self-pay

## 2024-03-22 ENCOUNTER — Other Ambulatory Visit (HOSPITAL_COMMUNITY): Payer: Self-pay

## 2024-03-22 DIAGNOSIS — C9200 Acute myeloblastic leukemia, not having achieved remission: Secondary | ICD-10-CM

## 2024-03-22 MED ORDER — OLUTASIDENIB 150 MG PO CAPS
150.0000 mg | ORAL_CAPSULE | Freq: Every day | ORAL | 2 refills | Status: AC
  Filled 2024-03-22 – 2024-03-24 (×2): qty 30, 30d supply, fill #0
  Filled 2024-04-26 – 2024-05-03 (×2): qty 30, 30d supply, fill #1
  Filled 2024-05-31: qty 30, 30d supply, fill #2

## 2024-03-24 ENCOUNTER — Other Ambulatory Visit: Payer: Self-pay | Admitting: Internal Medicine

## 2024-03-24 ENCOUNTER — Other Ambulatory Visit: Payer: Self-pay

## 2024-03-24 ENCOUNTER — Other Ambulatory Visit (HOSPITAL_COMMUNITY): Payer: Self-pay

## 2024-03-24 DIAGNOSIS — J019 Acute sinusitis, unspecified: Secondary | ICD-10-CM | POA: Diagnosis not present

## 2024-03-24 DIAGNOSIS — Z03818 Encounter for observation for suspected exposure to other biological agents ruled out: Secondary | ICD-10-CM | POA: Diagnosis not present

## 2024-03-24 DIAGNOSIS — Z889 Allergy status to unspecified drugs, medicaments and biological substances status: Secondary | ICD-10-CM | POA: Diagnosis not present

## 2024-03-24 DIAGNOSIS — B9689 Other specified bacterial agents as the cause of diseases classified elsewhere: Secondary | ICD-10-CM | POA: Diagnosis not present

## 2024-03-24 DIAGNOSIS — J029 Acute pharyngitis, unspecified: Secondary | ICD-10-CM | POA: Diagnosis not present

## 2024-03-25 NOTE — Telephone Encounter (Signed)
 Rx ok'd for synthroid  75mcg q day. #90 with one refill.

## 2024-03-28 ENCOUNTER — Other Ambulatory Visit: Payer: Self-pay

## 2024-03-28 NOTE — Progress Notes (Signed)
 Specialty Pharmacy Refill Coordination Note  Debra Hale is a 73 y.o. female contacted today regarding refills of specialty medication(s) Olutasidenib  (REZLIDHIA )   Patient requested Delivery   Delivery date: 03/31/24   Verified address: [address] [place] [address]   Medication will be filled on: 03/29/24

## 2024-04-18 ENCOUNTER — Other Ambulatory Visit: Payer: Self-pay | Admitting: Pharmacist

## 2024-04-18 ENCOUNTER — Inpatient Hospital Stay: Attending: Oncology

## 2024-04-18 ENCOUNTER — Inpatient Hospital Stay: Admitting: Pharmacist

## 2024-04-18 VITALS — BP 131/76 | HR 88 | Temp 96.8°F | Resp 17

## 2024-04-18 DIAGNOSIS — C9200 Acute myeloblastic leukemia, not having achieved remission: Secondary | ICD-10-CM

## 2024-04-18 DIAGNOSIS — Z79899 Other long term (current) drug therapy: Secondary | ICD-10-CM | POA: Insufficient documentation

## 2024-04-18 LAB — CMP (CANCER CENTER ONLY)
ALT: 23 U/L (ref 0–44)
AST: 12 U/L — ABNORMAL LOW (ref 15–41)
Albumin: 4.2 g/dL (ref 3.5–5.0)
Alkaline Phosphatase: 73 U/L (ref 38–126)
Anion gap: 11 (ref 5–15)
BUN: 15 mg/dL (ref 8–23)
CO2: 28 mmol/L (ref 22–32)
Calcium: 9 mg/dL (ref 8.9–10.3)
Chloride: 105 mmol/L (ref 98–111)
Creatinine: 1.01 mg/dL — ABNORMAL HIGH (ref 0.44–1.00)
GFR, Estimated: 58 mL/min — ABNORMAL LOW (ref >60)
Glucose, Bld: 104 mg/dL — ABNORMAL HIGH (ref 70–99)
Potassium: 3.2 mmol/L — ABNORMAL LOW (ref 3.5–5.1)
Sodium: 143 mmol/L (ref 135–145)
Total Bilirubin: 0.4 mg/dL (ref 0.0–1.2)
Total Protein: 6.5 g/dL (ref 6.5–8.1)

## 2024-04-18 LAB — CBC WITH DIFFERENTIAL/PLATELET
Abs Immature Granulocytes: 0.01 K/uL (ref 0.00–0.07)
Basophils Absolute: 0 K/uL (ref 0.0–0.1)
Basophils Relative: 1 %
Eosinophils Absolute: 0.1 K/uL (ref 0.0–0.5)
Eosinophils Relative: 3 %
HCT: 36.3 % (ref 36.0–46.0)
Hemoglobin: 11.6 g/dL — ABNORMAL LOW (ref 12.0–15.0)
Immature Granulocytes: 1 %
Lymphocytes Relative: 37 %
Lymphs Abs: 0.7 K/uL (ref 0.7–4.0)
MCH: 27.7 pg (ref 26.0–34.0)
MCHC: 32 g/dL (ref 30.0–36.0)
MCV: 86.6 fL (ref 80.0–100.0)
Monocytes Absolute: 0.2 K/uL (ref 0.1–1.0)
Monocytes Relative: 8 %
Neutro Abs: 1 K/uL — ABNORMAL LOW (ref 1.7–7.7)
Neutrophils Relative %: 50 %
Platelets: 53 K/uL — ABNORMAL LOW (ref 150–400)
RBC: 4.19 MIL/uL (ref 3.87–5.11)
RDW: 23.2 % — ABNORMAL HIGH (ref 11.5–15.5)
WBC: 2 K/uL — ABNORMAL LOW (ref 4.0–10.5)
nRBC: 0 % (ref 0.0–0.2)

## 2024-04-18 NOTE — Progress Notes (Unsigned)
 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 Previously treated with azacitadine and venetoclax . A repeat BM biopsy showed disease progression. 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 She was dose reduced to olutasidenib  100mg  daily on 06/22/22 due to decreased pltc.   Reason for Consult: Oral chemotherapy follow-up for Rezlidhia  (olutasidenib ) therapy.   PAST MEDICAL HISTORY: Past Medical History:  Diagnosis Date   Abdominal pain 03/06/2016   Acute pericarditis 05/31/2013   Anxiety    Arthritis    Osteoarthritis   Breast cancer (HCC) 2009   right breast lumpectomy with rad tx   Colon cancer (HCC)    surgery with chemo and rad tx   Complication of anesthesia    GERD (gastroesophageal reflux disease)    Heart palpitations    History of hiatal hernia    History of kidney stones    History of thyroid  cancer 09/18/2008   Qualifier: Diagnosis of   By: Wynetta CMA, Carol       Hypothyroidism    Liver disease    Liver nodule    s/p negative biopsy   Malignant neoplasm of thyroid  gland (HCC) 2002   s/p surgery and XRT   Osteoporosis    Other and unspecified hyperlipidemia    Palpitations    Personal history of chemotherapy    Personal history of malignant neoplasm of large intestine    carcinoma - cecum, s/p right laparoscopic colectomy - s/p chemotherapy and XRT   Personal history of radiation therapy    Pneumonia 2019   PONV (postoperative nausea and vomiting)    Pure hypercholesterolemia    Unspecified hereditary and idiopathic peripheral  neuropathy     HEMATOLOGY/ONCOLOGY HISTORY:  Oncology History  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 Initial Diagnosis   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HCC)   06/10/2020 - 12/19/2021 Chemotherapy   Patient is on Treatment Plan : AML dose reduced azacitidine  SQ D1-5 q28d     06/10/2020 - 02/13/2022 Chemotherapy   Patient is on Treatment Plan : AML Azacitidine  SQ D1-5 q28d       ALLERGIES:  is allergic to demeclocycline, sulfa antibiotics, tetracyclines & related, chlordiazepoxide, dicyclomine, levofloxacin , augmentin  [amoxicillin -pot clavulanate], bentyl [dicyclomine hcl], ciprofloxacin , codeine, dicyclomine hcl, epinephrine , flagyl  [metronidazole ], librax [chlordiazepoxide-clidinium], novocain [procaine], phenobarbital, prednisone , and tramadol.  MEDICATIONS:  Current Outpatient Medications  Medication Sig Dispense Refill   Azelastine -Fluticasone  137-50 MCG/ACT SUSP SPRAY 2 SPRAYS INTO EACH NOSTRIL EVERY DAY 23 g 2   carbidopa-levodopa (SINEMET IR) 25-100 MG tablet Take by mouth.     clonazePAM  (KLONOPIN ) 0.5 MG tablet Take 1 tablet (0.5 mg total) by mouth 3 (three) times daily as needed for anxiety. 30 tablet 0   escitalopram  (LEXAPRO ) 20 MG tablet Take 20 mg by mouth every morning.      levothyroxine  (SYNTHROID ) 75 MCG tablet TAKE 1 TABLET BY MOUTH ONCE DAILY BEFOREBREAKFAST 90 tablet 1   nortriptyline  (PAMELOR ) 25 MG capsule Take 25 mg by mouth 2 (two) times daily.     olutasidenib  (REZLIDHIA ) 150 MG capsule Take 1 capsule (150 mg total) by mouth daily. Take on an empty  stomach at least 1 hour before or 2 hours after a meal. 30 capsule 2   pantoprazole  (PROTONIX ) 40 MG tablet Take 1 tablet (40 mg total) by mouth 2 (two) times daily before a meal. 180 tablet 1   potassium chloride  (KLOR-CON  M) 10 MEQ tablet Take 1 tablet (10 mEq total) by mouth 2 (two) times daily. 60 tablet 2   prochlorperazine  (COMPAZINE ) 10 MG tablet TAKE ONE TABLET BY MOUTH EVERY 6 HOURS AS NEEDED FOR  NAUSEA / VOMITING 30 tablet 3   rOPINIRole (REQUIP) 0.25 MG tablet Take  0.25 mg twice a day for 3 days, then increase to 0.25 mg three time a day     rosuvastatin  (CRESTOR ) 5 MG tablet Take 1 tablet (5 mg total) by mouth daily. 30 tablet 2   valACYclovir  (VALTREX ) 500 MG tablet TAKE 1 TABLET BY MOUTH DAILY 30 tablet 2   Vitamin D , Ergocalciferol , (DRISDOL ) 1.25 MG (50000 UNIT) CAPS capsule Take 1 capsule (50,000 Units total) by mouth every 7 (seven) days. 12 capsule 1   No current facility-administered medications for this visit.    VITAL SIGNS: There were no vitals taken for this visit. There were no vitals filed for this visit.      Estimated body mass index is 21.11 kg/m as calculated from the following:   Height as of 02/29/24: 5' 4 (1.626 m).   Weight as of 02/29/24: 55.8 kg (123 lb).  LABS: CBC:    Component Value Date/Time   WBC 3.5 (L) 11/23/2023 1354   HGB 11.0 (L) 11/23/2023 1354   HGB 12.5 05/09/2018 1147   HCT 31.1 (L) 11/23/2023 1354   HCT 36.8 05/09/2018 1147   PLT 102 (L) 11/23/2023 1354   PLT 285 05/09/2018 1147   MCV 103.0 (H) 11/23/2023 1354   MCV 86 05/09/2018 1147   MCV 89 11/30/2013 1419   NEUTROABS 2.4 11/23/2023 1354   NEUTROABS 2.9 11/20/2013 1404   LYMPHSABS 0.8 11/23/2023 1354   LYMPHSABS 0.9 (L) 11/20/2013 1404   MONOABS 0.2 11/23/2023 1354   MONOABS 0.2 11/20/2013 1404   EOSABS 0.1 11/23/2023 1354   EOSABS 0.1 11/20/2013 1404   BASOSABS 0.0 11/23/2023 1354   BASOSABS 0.0 11/20/2013 1404   Comprehensive Metabolic Panel:    Component Value Date/Time   NA 142 02/29/2024 1523   NA 143 11/12/2023 1416   NA 145 11/30/2013 1419   K 3.7 02/29/2024 1523   K 3.5 11/30/2013 1419   CL 105 02/29/2024 1523   CL 109 (H) 11/30/2013 1419   CO2 30 02/29/2024 1523   CO2 29 11/30/2013 1419   BUN 14 02/29/2024 1523   BUN 15 11/12/2023 1416   BUN 15 11/30/2013 1419   CREATININE 0.90 02/29/2024 1523   CREATININE 1.04 11/30/2013 1419   GLUCOSE 88  02/29/2024 1523   GLUCOSE 97 11/30/2013 1419   CALCIUM  8.8 02/29/2024 1523   CALCIUM  8.7 11/30/2013 1419   AST 9 02/29/2024 1523   AST 28 11/30/2013 1419   ALT 20 02/29/2024 1523   ALT 71 (H) 11/30/2013 1419   ALKPHOS 70 02/29/2024 1523   ALKPHOS 114 11/30/2013 1419   BILITOT 0.5 02/29/2024 1523   BILITOT 0.4 11/12/2023 1416   BILITOT 0.7 11/30/2013 1419   PROT 6.0 02/29/2024 1523   PROT 6.2 11/12/2023 1416   PROT 7.1 11/30/2013 1419   ALBUMIN 3.9 02/29/2024 1523   ALBUMIN 4.3 11/12/2023 1416   ALBUMIN 3.3 (L) 11/30/2013 1419     Present during  today's visit: patient only  Assessment and Plan: CMP/CBC reviewed: Platelets decreased to 53 and neutrophils 1.0, prior to today patient's platelets have been in the range of 100-125 and neutrophils have been within normal limits. When asked how she was taking her olutasidenib , patient reports taking 1 tablet twice daily.  Patient is supposed to be taking 1 tablet daily, she is reduced to this dose on 06/22/2022 due to thrombocytopenia. Asked patient several times and she continued to report taking 1 tablet twice daily, and thought this was her normal dose.  Informed her that she has been on the reduced dose of 1 tablet daily since last year.  Of note, pharmacy only sends patient 30 tablets monthly and patient does not report running out of medication.  Asked patient to look at her medication bottle when she returns home to make sure we are talking about the same medication and she is not confusing it with something else. If she is taking the higher dose, this may explain why her platelets decreased Discussed platelet and neutrophil decrease with Dr. Jacobo, he would like for patient to return to clinic sooner in 1 month and have peripheral blood flow cytometry checked in addition to her regular labs No reported olutasidenib  side effects   Oral Chemotherapy Adherence: No missed doses reported There are no patient barriers to medication  adherence identified.   New medications: None reported   Medication Access Issues: No issues, patient fills at [facility], she was reminded that [employer] got her grant to cover the cost of her medication for 2026  Patient expressed understanding and was in agreement with this plan. She also understands that She can call clinic at any time with any questions, concerns, or complaints.   Follow-up plan: RTC in 1 month labs/MD/Pharm  Thank you for allowing me to participate in the care of this very pleasant patient.   Time Total: 15 minutes  Visit consisted of counseling and education on dealing with issues of symptom management in the setting of serious and potentially life-threatening illness.Greater than 50% of this time was spent counseling and coordinating care related to the above assessment and plan.  Harvey Matlack N. Laniyah Rosenwald, PharmD, BCOP, CPP Hematology/Oncology Clinical Pharmacist Practitioner [place]/[facility] [phone]  04/18/2024 2:03 PM

## 2024-04-19 NOTE — Progress Notes (Signed)
 Addendum: Patient returned call to clinic after getting home and reviewing her olutasidenib  bottle.  She confirms that the bottle does say take 1 capsule daily, but that she has been taking 1 capsule twice daily.  She is unable to confirm how long she has been taking 2 capsules daily.  This increase in dose could be the cause of her platelet count dropped.  In reviewing refill history from [facility] ([facility]) medication has been filled at normal monthly schedule, so this most likely she has only been taking 1 capsule twice daily for a few weeks.  Patient knows to go back to her prescribed dose of 1 capsule daily.

## 2024-04-24 ENCOUNTER — Other Ambulatory Visit (HOSPITAL_COMMUNITY): Payer: Self-pay

## 2024-04-26 ENCOUNTER — Other Ambulatory Visit: Payer: Self-pay

## 2024-04-28 ENCOUNTER — Other Ambulatory Visit (HOSPITAL_COMMUNITY): Payer: Self-pay

## 2024-05-02 ENCOUNTER — Other Ambulatory Visit (HOSPITAL_COMMUNITY): Payer: Self-pay

## 2024-05-03 ENCOUNTER — Other Ambulatory Visit: Payer: Self-pay

## 2024-05-03 NOTE — Progress Notes (Signed)
 Specialty Pharmacy Refill Coordination Note  Debra Hale is a 73 y.o. female contacted today regarding refills of specialty medication(s) Olutasidenib  (REZLIDHIA )   Patient requested Delivery   Delivery date: 05/08/24   Verified address: [address] [place] [address]   Medication will be filled on: 05/05/24

## 2024-05-08 ENCOUNTER — Other Ambulatory Visit: Payer: Self-pay

## 2024-05-19 ENCOUNTER — Other Ambulatory Visit: Payer: Self-pay | Admitting: *Deleted

## 2024-05-19 DIAGNOSIS — C9200 Acute myeloblastic leukemia, not having achieved remission: Secondary | ICD-10-CM

## 2024-05-22 ENCOUNTER — Inpatient Hospital Stay: Attending: Oncology

## 2024-05-22 ENCOUNTER — Inpatient Hospital Stay (HOSPITAL_BASED_OUTPATIENT_CLINIC_OR_DEPARTMENT_OTHER): Admitting: Oncology

## 2024-05-22 ENCOUNTER — Encounter: Payer: Self-pay | Admitting: Oncology

## 2024-05-22 ENCOUNTER — Inpatient Hospital Stay: Admitting: Pharmacist

## 2024-05-22 VITALS — BP 126/85 | HR 85 | Temp 97.3°F | Resp 18 | Ht 64.0 in | Wt 122.0 lb

## 2024-05-22 DIAGNOSIS — C9200 Acute myeloblastic leukemia, not having achieved remission: Secondary | ICD-10-CM

## 2024-05-22 LAB — CBC WITH DIFFERENTIAL/PLATELET
Abs Immature Granulocytes: 0.01 K/uL (ref 0.00–0.07)
Basophils Absolute: 0 K/uL (ref 0.0–0.1)
Basophils Relative: 0 %
Eosinophils Absolute: 0 K/uL (ref 0.0–0.5)
Eosinophils Relative: 2 %
HCT: 33.8 % — ABNORMAL LOW (ref 36.0–46.0)
Hemoglobin: 10.7 g/dL — ABNORMAL LOW (ref 12.0–15.0)
Immature Granulocytes: 1 %
Lymphocytes Relative: 39 %
Lymphs Abs: 0.6 K/uL — ABNORMAL LOW (ref 0.7–4.0)
MCH: 26.8 pg (ref 26.0–34.0)
MCHC: 31.7 g/dL (ref 30.0–36.0)
MCV: 84.5 fL (ref 80.0–100.0)
Monocytes Absolute: 0.1 K/uL (ref 0.1–1.0)
Monocytes Relative: 6 %
Neutro Abs: 0.9 K/uL — ABNORMAL LOW (ref 1.7–7.7)
Neutrophils Relative %: 52 %
Platelets: 41 K/uL — ABNORMAL LOW (ref 150–400)
RBC: 4 MIL/uL (ref 3.87–5.11)
RDW: 24.4 % — ABNORMAL HIGH (ref 11.5–15.5)
WBC: 1.6 K/uL — ABNORMAL LOW (ref 4.0–10.5)
nRBC: 0 % (ref 0.0–0.2)

## 2024-05-22 LAB — CMP (CANCER CENTER ONLY)
ALT: 20 U/L (ref 0–44)
AST: 11 U/L — ABNORMAL LOW (ref 15–41)
Albumin: 3.9 g/dL (ref 3.5–5.0)
Alkaline Phosphatase: 71 U/L (ref 38–126)
Anion gap: 10 (ref 5–15)
BUN: 15 mg/dL (ref 8–23)
CO2: 28 mmol/L (ref 22–32)
Calcium: 9 mg/dL (ref 8.9–10.3)
Chloride: 106 mmol/L (ref 98–111)
Creatinine: 1.1 mg/dL — ABNORMAL HIGH (ref 0.44–1.00)
GFR, Estimated: 52 mL/min — ABNORMAL LOW
Glucose, Bld: 102 mg/dL — ABNORMAL HIGH (ref 70–99)
Potassium: 3.2 mmol/L — ABNORMAL LOW (ref 3.5–5.1)
Sodium: 144 mmol/L (ref 135–145)
Total Bilirubin: 0.6 mg/dL (ref 0.0–1.2)
Total Protein: 6.2 g/dL — ABNORMAL LOW (ref 6.5–8.1)

## 2024-05-22 NOTE — Progress Notes (Unsigned)
 Patient is having more diarrhea now, she is taking imodium .

## 2024-05-22 NOTE — Progress Notes (Signed)
 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 Previously treated with azacitadine and venetoclax . A repeat BM biopsy showed disease progression. 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 She was dose reduced to olutasidenib  100mg  daily on 06/22/22 due to decreased pltc.   Reason for Consult: Oral chemotherapy follow-up for Rezlidhia  (olutasidenib ) therapy.   PAST MEDICAL HISTORY: Past Medical History:  Diagnosis Date   Abdominal pain 03/06/2016   Acute pericarditis 05/31/2013   Anxiety    Arthritis    Osteoarthritis   Breast cancer (HCC) 2009   right breast lumpectomy with rad tx   Colon cancer (HCC)    surgery with chemo and rad tx   Complication of anesthesia    GERD (gastroesophageal reflux disease)    Heart palpitations    History of hiatal hernia    History of kidney stones    History of thyroid  cancer 09/18/2008   Qualifier: Diagnosis of   By: Wynetta CMA, Carol       Hypothyroidism    Liver disease    Liver nodule    s/p negative biopsy   Malignant neoplasm of thyroid  gland (HCC) 2002   s/p surgery and XRT   Osteoporosis    Other and unspecified hyperlipidemia    Palpitations    Personal history of chemotherapy    Personal history of malignant neoplasm of large intestine    carcinoma - cecum, s/p right laparoscopic colectomy - s/p chemotherapy and XRT   Personal history of radiation therapy    Pneumonia 2019   PONV (postoperative nausea and vomiting)    Pure hypercholesterolemia    Unspecified hereditary and idiopathic peripheral  neuropathy     HEMATOLOGY/ONCOLOGY HISTORY:  Oncology History  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 Initial Diagnosis   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HCC)   06/10/2020 - 12/19/2021 Chemotherapy   Patient is on Treatment Plan : AML dose reduced azacitidine  SQ D1-5 q28d     06/10/2020 - 02/13/2022 Chemotherapy   Patient is on Treatment Plan : AML Azacitidine  SQ D1-5 q28d       ALLERGIES:  is allergic to demeclocycline, sulfa antibiotics, tetracyclines & related, chlordiazepoxide, dicyclomine, levofloxacin , augmentin  [amoxicillin -pot clavulanate], bentyl [dicyclomine hcl], ciprofloxacin , codeine, dicyclomine hcl, epinephrine , flagyl  [metronidazole ], librax [chlordiazepoxide-clidinium], novocain [procaine], phenobarbital, prednisone , and tramadol.  MEDICATIONS:  Current Outpatient Medications  Medication Sig Dispense Refill   Azelastine -Fluticasone  137-50 MCG/ACT SUSP SPRAY 2 SPRAYS INTO EACH NOSTRIL EVERY DAY 23 g 2   carbidopa-levodopa (SINEMET IR) 25-100 MG tablet Take by mouth. (Patient not taking: Reported on 05/22/2024)     clonazePAM  (KLONOPIN ) 0.5 MG tablet Take 1 tablet (0.5 mg total) by mouth 3 (three) times daily as needed for anxiety. 30 tablet 0   escitalopram  (LEXAPRO ) 20 MG tablet Take 20 mg by mouth every morning.      levothyroxine  (SYNTHROID ) 75 MCG tablet TAKE 1 TABLET BY MOUTH ONCE DAILY BEFOREBREAKFAST 90 tablet 1   loperamide  (IMODIUM ) 2 MG capsule Take by mouth as needed for diarrhea or loose stools.     nortriptyline  (PAMELOR ) 25 MG capsule Take 25 mg by mouth  2 (two) times daily.     olutasidenib  (REZLIDHIA ) 150 MG capsule Take 1 capsule (150 mg total) by mouth daily. Take on an empty stomach at least 1 hour before or 2 hours after a meal. 30 capsule 2   pantoprazole  (PROTONIX ) 40 MG tablet Take 1 tablet (40 mg total) by mouth 2 (two) times daily before a meal. 180 tablet 1   potassium chloride  (KLOR-CON  M) 10 MEQ tablet Take 1 tablet (10 mEq total) by  mouth 2 (two) times daily. 60 tablet 2   prochlorperazine  (COMPAZINE ) 10 MG tablet TAKE ONE TABLET BY MOUTH EVERY 6 HOURS AS NEEDED FOR NAUSEA / VOMITING 30 tablet 3   rOPINIRole (REQUIP) 0.25 MG tablet Take  0.25 mg twice a day for 3 days, then increase to 0.25 mg three time a day (Patient not taking: Reported on 05/22/2024)     rosuvastatin  (CRESTOR ) 5 MG tablet Take 1 tablet (5 mg total) by mouth daily. (Patient not taking: Reported on 05/22/2024) 30 tablet 2   valACYclovir  (VALTREX ) 500 MG tablet TAKE 1 TABLET BY MOUTH DAILY 30 tablet 2   Vitamin D , Ergocalciferol , (DRISDOL ) 1.25 MG (50000 UNIT) CAPS capsule Take 1 capsule (50,000 Units total) by mouth every 7 (seven) days. (Patient not taking: Reported on 05/22/2024) 12 capsule 1   No current facility-administered medications for this visit.    VITAL SIGNS: There were no vitals taken for this visit. There were no vitals filed for this visit.      Estimated body mass index is 20.94 kg/m as calculated from the following:   Height as of an earlier encounter on 05/22/24: 5' 4 (1.626 m).   Weight as of an earlier encounter on 05/22/24: 55.3 kg (122 lb).  LABS: CBC:    Component Value Date/Time   WBC 1.6 (L) 05/22/2024 1402   HGB 10.7 (L) 05/22/2024 1402   HGB 12.5 05/09/2018 1147   HCT 33.8 (L) 05/22/2024 1402   HCT 36.8 05/09/2018 1147   PLT 41 (L) 05/22/2024 1402   PLT 285 05/09/2018 1147   MCV 84.5 05/22/2024 1402   MCV 86 05/09/2018 1147   MCV 89 11/30/2013 1419   NEUTROABS 0.9 (L) 05/22/2024 1402   NEUTROABS 2.9 11/20/2013 1404   LYMPHSABS 0.6 (L) 05/22/2024 1402   LYMPHSABS 0.9 (L) 11/20/2013 1404   MONOABS 0.1 05/22/2024 1402   MONOABS 0.2 11/20/2013 1404   EOSABS 0.0 05/22/2024 1402   EOSABS 0.1 11/20/2013 1404   BASOSABS 0.0 05/22/2024 1402   BASOSABS 0.0 11/20/2013 1404   Comprehensive Metabolic Panel:    Component Value Date/Time   NA 144 05/22/2024 1401   NA 143 11/12/2023 1416   NA 145 11/30/2013 1419   K  3.2 (L) 05/22/2024 1401   K 3.5 11/30/2013 1419   CL 106 05/22/2024 1401   CL 109 (H) 11/30/2013 1419   CO2 28 05/22/2024 1401   CO2 29 11/30/2013 1419   BUN 15 05/22/2024 1401   BUN 15 11/12/2023 1416   BUN 15 11/30/2013 1419   CREATININE 1.10 (H) 05/22/2024 1401   CREATININE 1.04 11/30/2013 1419   GLUCOSE 102 (H) 05/22/2024 1401   GLUCOSE 97 11/30/2013 1419   CALCIUM  9.0 05/22/2024 1401   CALCIUM  8.7 11/30/2013 1419   AST 11 (L) 05/22/2024 1401   ALT 20 05/22/2024 1401   ALT 71 (H) 11/30/2013 1419   ALKPHOS 71 05/22/2024 1401   ALKPHOS 114 11/30/2013 1419   BILITOT 0.6 05/22/2024 1401   PROT 6.2 (  L) 05/22/2024 1401   PROT 6.2 11/12/2023 1416   PROT 7.1 11/30/2013 1419   ALBUMIN 3.9 05/22/2024 1401   ALBUMIN 4.3 11/12/2023 1416   ALBUMIN 3.3 (L) 11/30/2013 1419     Present during today's visit: patient only  Assessment and Plan: CMP/CBC reviewed, flow cytometry still pending: Platelets decreased to 41 and neutrophils 0.9, they are decreased at last office visit, but were previously within normal limits Patient reports taking her olutasidenib  as prescribed, continue olutasidenib  100mg  daily  Patient agreeable to repeat bone marrow biopsy, last biopsy was performed in May 2025 No reported olutasidenib  side effects   Oral Chemotherapy Adherence: No missed doses reported There are no patient barriers to medication adherence identified.   New medications: None reported   Medication Access Issues: No issues, patient fills at [facility]  Patient expressed understanding and was in agreement with this plan. She also understands that She can call clinic at any time with any questions, concerns, or complaints.   Follow-up plan: RTC post bone marrow biopsy  Thank you for allowing me to participate in the care of this very pleasant patient.   Time Total: 15 minutes  Visit consisted of counseling and education on dealing with issues of symptom management in the  setting of serious and potentially life-threatening illness.Greater than 50% of this time was spent counseling and coordinating care related to the above assessment and plan.  Juliany Daughety N. Legacie Dillingham, PharmD, BCOP, CPP Hematology/Oncology Clinical Pharmacist Practitioner /[facility] [phone]  05/22/2024 4:10 PM   "

## 2024-05-22 NOTE — Progress Notes (Unsigned)
 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  She currently feels well and is asymptomatic.  She does not complain of any weakness or fatigue today.  She is tolerating her treatment without significant side effects.  Her tremor has improved with her Parkinson treatment. She denies any pain.  She has no neurologic complaints.  She denies any recent fevers or illnesses.  She has a fair appetite, but denies weight loss.  She has no chest pain, shortness of breath, cough, or hemoptysis.  She denies any nausea, vomiting, constipation, or diarrhea.  She has no urinary complaints.  Patient offers no further specific complaints today.  REVIEW OF SYSTEMS:   Review of Systems  Constitutional: Negative.  Negative for fever, malaise/fatigue and weight loss.  HENT:  Negative for congestion and sinus pain.   Respiratory: Negative.  Negative for cough, hemoptysis and shortness of breath.   Cardiovascular: Negative.  Negative for chest pain and leg swelling.  Gastrointestinal: Negative.  Negative for abdominal pain, diarrhea and nausea.  Genitourinary:  Negative for dysuria, frequency and urgency.  Musculoskeletal: Negative.  Negative for back pain and falls.  Skin: Negative.  Negative for rash.  Neurological: Negative.  Negative for dizziness, tremors, speech change, focal weakness, weakness and headaches.  Psychiatric/Behavioral: Negative.  Negative for memory loss. The patient is not nervous/anxious.     As per HPI.  Otherwise, a complete review of systems is negative.  PAST MEDICAL HISTORY: Past Medical History:  Diagnosis Date   Abdominal pain 03/06/2016   Acute pericarditis 05/31/2013   Anxiety    Arthritis    Osteoarthritis   Breast cancer (HCC) 2009   right breast lumpectomy with rad tx   Colon cancer (HCC)    surgery with chemo and rad tx   Complication of anesthesia    GERD (gastroesophageal reflux disease)    Heart palpitations    History of hiatal hernia    History of kidney stones    History of thyroid  cancer 09/18/2008   Qualifier: Diagnosis of   By: Wynetta, CMA, Carol       Hypothyroidism    Liver disease    Liver nodule    s/p negative biopsy   Malignant neoplasm of thyroid  gland (HCC) 2002   s/p surgery and XRT   Osteoporosis    Other and unspecified hyperlipidemia    Palpitations    Personal history of chemotherapy    Personal history of malignant neoplasm of large intestine    carcinoma - cecum, s/p right laparoscopic colectomy - s/p chemotherapy and XRT   Personal history of radiation therapy    Pneumonia 2019   PONV (postoperative nausea and vomiting)    Pure hypercholesterolemia    Unspecified hereditary and idiopathic peripheral neuropathy     PAST SURGICAL HISTORY: Past Surgical History:  Procedure Laterality Date   APPENDECTOMY  1985   BREAST BIOPSY Right 2009   positive   BREAST BIOPSY Right 2009   negative   BREAST LUMPECTOMY Right 2009   breast cancer   CHOLECYSTECTOMY  1995   COLONOSCOPY  COLONOSCOPY WITH PROPOFOL  N/A 10/29/2017   Procedure: COLONOSCOPY WITH PROPOFOL ;  Surgeon: Viktoria Lamar DASEN, MD;  Location: [facility] ENDOSCOPY;  Service: Endoscopy;  Laterality: N/A;   DILATION AND CURETTAGE OF UTERUS  1990   DILATION AND CURETTAGE, DIAGNOSTIC / THERAPEUTIC  1990   ESOPHAGOGASTRODUODENOSCOPY     ESOPHAGOGASTRODUODENOSCOPY (EGD) WITH PROPOFOL  N/A 10/29/2017   Procedure: ESOPHAGOGASTRODUODENOSCOPY (EGD) WITH PROPOFOL ;  Surgeon: Viktoria Lamar DASEN, MD;   Location: [facility] ENDOSCOPY;  Service: Endoscopy;  Laterality: N/A;   INCISION AND DRAINAGE PERIRECTAL ABSCESS N/A 06/17/2020   Procedure: IRRIGATION AND DEBRIDEMENT PERIRECTAL ABSCESS;  Surgeon: Jordis Laneta FALCON, MD;  Location: [facility] ORS;  Service: General;  Laterality: N/A;   IR BONE MARROW BIOPSY & ASPIRATION  07/09/2021   IR BONE MARROW BIOPSY & ASPIRATION  09/03/2023   LAPAROSCOPIC PARTIAL COLECTOMY     stage 3-C carcinoma of the cecum, s/p chemotherapy and xrt   LITHOTRIPSY     SIGMOIDOSCOPY  08/26/1993   THYROID  LOBECTOMY  2002   s/p XRT   TOTAL HIP ARTHROPLASTY Right 10/24/2019   Procedure: TOTAL HIP ARTHROPLASTY;  Surgeon: Edie Norleen PARAS, MD;  Location: [facility] ORS;  Service: Orthopedics;  Laterality: Right;    FAMILY HISTORY: Family History  Problem Relation Age of Onset   Stroke Mother        48s   Alzheimer's disease Mother    Lung cancer Father    Prostate cancer Father    Cancer Father        Colon   Colon cancer Father    Breast cancer Sister        60's   Lung cancer Sister    Breast cancer Maternal Aunt     ADVANCED DIRECTIVES (Y/N):  N  HEALTH MAINTENANCE: Social History   Tobacco Use   Smoking status: Never   Smokeless tobacco: Never  Vaping Use   Vaping status: Never Used  Substance Use Topics   Alcohol use: No    Alcohol/week: 0.0 standard drinks of alcohol   Drug use: No     Colonoscopy:  PAP:  Bone density:  Lipid panel:  Allergies  Allergen Reactions   Demeclocycline Other (See Comments)    Throat swells   Sulfa Antibiotics Other (See Comments)    Other reaction(s): Other (See Comments) Throat swells Other reaction(s): Other (See Comments) Throat swells Throat swells Other reaction(s): Other (See Comments) Throat swells Other reaction(s): Unknown Other reaction(s): Other (See Comments) Throat swells Other reaction(s): Other (See Comments) Other reaction(s): Other (See Comments) Throat swellsOther reaction(s): Other (See Comments) Throat  swells   Tetracyclines & Related Other (See Comments)    Throat swells   Chlordiazepoxide    Dicyclomine    Levofloxacin  Diarrhea    Severe diarrhea   Augmentin  [Amoxicillin -Pot Clavulanate] Diarrhea   Bentyl [Dicyclomine Hcl]     unkn   Ciprofloxacin  Diarrhea   Codeine Other (See Comments)    dizziness     Dicyclomine Hcl Other (See Comments)    unkn   Epinephrine      Speeds heart up - panic    Flagyl  [Metronidazole ] Nausea And Vomiting   Librax [Chlordiazepoxide-Clidinium]     unkn   Novocain [Procaine] Other (See Comments)    Shaky   Phenobarbital     unkn   Prednisone      Nervous     Tramadol Other (See Comments)    Sick feeling    Current Outpatient Medications  Medication Sig Dispense Refill   Azelastine -Fluticasone  137-50 MCG/ACT SUSP  SPRAY 2 SPRAYS INTO EACH NOSTRIL EVERY DAY 23 g 2   clonazePAM  (KLONOPIN ) 0.5 MG tablet Take 1 tablet (0.5 mg total) by mouth 3 (three) times daily as needed for anxiety. 30 tablet 0   escitalopram  (LEXAPRO ) 20 MG tablet Take 20 mg by mouth every morning.      levothyroxine  (SYNTHROID ) 75 MCG tablet TAKE 1 TABLET BY MOUTH ONCE DAILY BEFOREBREAKFAST 90 tablet 1   loperamide  (IMODIUM ) 2 MG capsule Take by mouth as needed for diarrhea or loose stools.     nortriptyline  (PAMELOR ) 25 MG capsule Take 25 mg by mouth 2 (two) times daily.     olutasidenib  (REZLIDHIA ) 150 MG capsule Take 1 capsule (150 mg total) by mouth daily. Take on an empty stomach at least 1 hour before or 2 hours after a meal. 30 capsule 2   pantoprazole  (PROTONIX ) 40 MG tablet Take 1 tablet (40 mg total) by mouth 2 (two) times daily before a meal. 180 tablet 1   potassium chloride  (KLOR-CON  M) 10 MEQ tablet Take 1 tablet (10 mEq total) by mouth 2 (two) times daily. 60 tablet 2   prochlorperazine  (COMPAZINE ) 10 MG tablet TAKE ONE TABLET BY MOUTH EVERY 6 HOURS AS NEEDED FOR NAUSEA / VOMITING 30 tablet 3   valACYclovir  (VALTREX ) 500 MG tablet TAKE 1 TABLET BY MOUTH DAILY  30 tablet 2   carbidopa-levodopa (SINEMET IR) 25-100 MG tablet Take by mouth. (Patient not taking: Reported on 05/22/2024)     rOPINIRole (REQUIP) 0.25 MG tablet Take  0.25 mg twice a day for 3 days, then increase to 0.25 mg three time a day (Patient not taking: Reported on 05/22/2024)     rosuvastatin  (CRESTOR ) 5 MG tablet Take 1 tablet (5 mg total) by mouth daily. (Patient not taking: Reported on 05/22/2024) 30 tablet 2   Vitamin D , Ergocalciferol , (DRISDOL ) 1.25 MG (50000 UNIT) CAPS capsule Take 1 capsule (50,000 Units total) by mouth every 7 (seven) days. (Patient not taking: Reported on 05/22/2024) 12 capsule 1   No current facility-administered medications for this visit.    OBJECTIVE: Vitals:   05/22/24 1421  BP: 126/85  Pulse: 85  Resp: 18  Temp: (!) 97.3 F (36.3 C)  SpO2: 100%     Body mass index is 20.94 kg/m.    ECOG FS:0 - Asymptomatic  General: Well-developed, well-nourished, no acute distress. Eyes: Pink conjunctiva, anicteric sclera. HEENT: Normocephalic, moist mucous membranes. Lungs: No audible wheezing or coughing. Heart: Regular rate and rhythm. Abdomen: Soft, nontender, no obvious distention. Musculoskeletal: No edema, cyanosis, or clubbing. Neuro: Alert, answering all questions appropriately. Cranial nerves grossly intact. Skin: No rashes or petechiae noted. Psych: Normal affect.  LAB RESULTS:  Lab Results  Component Value Date   NA 144 05/22/2024   K 3.2 (L) 05/22/2024   CL 106 05/22/2024   CO2 28 05/22/2024   GLUCOSE 102 (H) 05/22/2024   BUN 15 05/22/2024   CREATININE 1.10 (H) 05/22/2024   CALCIUM  9.0 05/22/2024   PROT 6.2 (L) 05/22/2024   ALBUMIN 3.9 05/22/2024   AST 11 (L) 05/22/2024   ALT 20 05/22/2024   ALKPHOS 71 05/22/2024   BILITOT 0.6 05/22/2024   GFRNONAA 52 (L) 05/22/2024   GFRAA 54 (L) 10/26/2019    Lab Results  Component Value Date   WBC 1.6 (L) 05/22/2024   NEUTROABS 0.9 (L) 05/22/2024   HGB 10.7 (L) 05/22/2024   HCT 33.8 (L)  05/22/2024   MCV 84.5 05/22/2024   PLT 41 (L) 05/22/2024  STUDIES: No results found.    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  PLAN:    AML: Confirmed by bone marrow biopsy.  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  These mutations were not reported on her most recent cytogenetics report.  Patient's most recent bone marrow biopsy on Sep 03, 2023 revealed no morphologic evidence of AML.  Prior to this, patient noted to have a 15% blast count in November 2023.  She last received treatment with cycle 22 of Vidaza  on February 13, 2022.  Given her progressive disease and IDH-1 mutation, Vidaza  was discontinued and patient initially received 150 mg twice daily of Olutasidenib  (Rezlidhia ).  This has subsequently been dose reduced to once daily.  Given her progressive pancytopenia, patient has agreed to repeat bone marrow biopsy in the next 1 to 2 weeks.  She has been instructed to continue treatment as prescribed.  Return to clinic 1 week after her biopsy to discuss the results.  Appreciate clinical pharmacy input.   Thrombocytopenia: Platelet count has trended down to 41.  Bone marrow biopsy as above.  Continue dose reduced treatment once per day as above. Leukopenia: Total white blood cell count has trended down to 1.6.  Bone marrow biopsy as above. Anemia: Hemoglobin is trended down to 10.7. Diarrhea: Patient does not complain of this today.  Continue Lomotil  as needed.   Nausea: Patient does not complain of this today.  Continue Compazine  as needed.   Parkinson's: Continue treatment and management per neurology. Patient now on amitriptyline and carbidopa/levodopa.   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  Patient also received chemotherapy and likely received Adriamycin, but exact regimen is unknown.  She completed 5 years of hormonal therapy in approximately June 2015. Currently, she has no evidence of disease.  Her most recent mammogram on July 29, 2021 was reported as BI-RADS 2.   History of colon cancer: Patient also states that she received chemotherapy for this, possibly FOLFOX but again this is unknown.   Patient expressed understanding and was in agreement with this plan. She also understands that She can call clinic at any time with any questions, concerns, or complaints.    Cancer Staging  History of breast cancer Staging form: Breast, AJCC 7th Edition - Clinical stage from 01/07/2016: Stage IA (T1c, N0, M0) - Signed by Jacobo Evalene PARAS, MD on 01/07/2016 Laterality: Right Estrogen receptor status: Positive Progesterone receptor status: Positive HER2 status: Negative   Evalene PARAS Jacobo, MD   05/23/2024 7:20 AM     "

## 2024-05-23 ENCOUNTER — Telehealth: Payer: Self-pay | Admitting: *Deleted

## 2024-05-23 NOTE — Telephone Encounter (Signed)
 RN placed call to patient regarding appointment for bone marrow biopsy. Patient is scheduled for bone marrow biopsy on Friday 1/30, arrive at 8:30 am for 9:30 am procedure [facility]. Patient verbalized understanding and is in agreement with plan. Scheduling message sent to schedule lab/Finn/Alyson 2/5 or 2/6 for follow up and results.

## 2024-05-24 LAB — COMP PANEL: LEUKEMIA/LYMPHOMA

## 2024-05-28 ENCOUNTER — Other Ambulatory Visit: Payer: Self-pay | Admitting: Radiology

## 2024-05-28 DIAGNOSIS — C9200 Acute myeloblastic leukemia, not having achieved remission: Secondary | ICD-10-CM

## 2024-05-31 ENCOUNTER — Other Ambulatory Visit: Payer: Self-pay

## 2024-05-31 ENCOUNTER — Telehealth: Payer: Self-pay | Admitting: *Deleted

## 2024-05-31 NOTE — Telephone Encounter (Signed)
 I spoke with patient. She understands that we received her message and her apts will be r/s. She will be notified re: new apt dates. She thanked me for calling her back.

## 2024-05-31 NOTE — Telephone Encounter (Signed)
 Patient wants to cnl her apts on Friday this week for biopsy due to the inclement weather. Please coordinate new apt times with Clarita Ricker.

## 2024-05-31 NOTE — Telephone Encounter (Signed)
 RN communicated with Clarita Ricker, scheduler for IR. Clarita stated that patient called and requested to cancel appointment and stated she would call her next week to reschedule bone marrow biopsy.  Pt is worried about potential bad weather and wanted to wait.

## 2024-05-31 NOTE — Progress Notes (Signed)
 Specialty Pharmacy Refill Coordination Note  Debra Hale is a 74 y.o. female contacted today regarding refills of specialty medication(s) Olutasidenib  (REZLIDHIA )   Patient requested Delivery   Delivery date: 06/01/24   Verified address: [address] [place] [address]   Medication will be filled on: 06/01/24   Patient is aware medication is on order and pharmacy will follow up with pt should there be any delays.

## 2024-06-01 ENCOUNTER — Ambulatory Visit: Admitting: Internal Medicine

## 2024-06-01 ENCOUNTER — Telehealth: Payer: Self-pay | Admitting: Oncology

## 2024-06-01 ENCOUNTER — Other Ambulatory Visit: Payer: Self-pay

## 2024-06-01 NOTE — Telephone Encounter (Signed)
 Pt called to r/s biopsy from 1/30 - messaged Clarita and got best number and time from pt for contact purposes - LH

## 2024-06-02 ENCOUNTER — Ambulatory Visit: Admitting: Radiology

## 2024-06-08 ENCOUNTER — Telehealth: Payer: Self-pay

## 2024-06-08 ENCOUNTER — Inpatient Hospital Stay: Admitting: Pharmacist

## 2024-06-08 ENCOUNTER — Inpatient Hospital Stay: Admitting: Oncology

## 2024-06-08 ENCOUNTER — Inpatient Hospital Stay

## 2024-06-08 ENCOUNTER — Other Ambulatory Visit: Payer: Self-pay | Admitting: Radiology

## 2024-06-08 NOTE — Telephone Encounter (Signed)
 Patient for IR Bone Marrow Biopsy on Friday 06/10/23, I called and spoke with the patient on the phone and gave pre-procedure instructions. Pt was made aware to be here at 7:30a, NPO after MN prior to procedure as well as driver post procedure/recovery/discharge. Pt stated understanding. Called 06/08/24

## 2024-06-09 ENCOUNTER — Inpatient Hospital Stay: Admission: RE | Admit: 2024-06-09 | Admitting: Radiology

## 2024-06-09 DIAGNOSIS — C9200 Acute myeloblastic leukemia, not having achieved remission: Secondary | ICD-10-CM

## 2024-06-09 LAB — CBC WITH DIFFERENTIAL/PLATELET
Abs Immature Granulocytes: 0.01 10*3/uL (ref 0.00–0.07)
Basophils Absolute: 0 10*3/uL (ref 0.0–0.1)
Basophils Relative: 0 %
Eosinophils Absolute: 0 10*3/uL (ref 0.0–0.5)
Eosinophils Relative: 1 %
HCT: 38.3 % (ref 36.0–46.0)
Hemoglobin: 12 g/dL (ref 12.0–15.0)
Immature Granulocytes: 1 %
Lymphocytes Relative: 42 %
Lymphs Abs: 0.8 10*3/uL (ref 0.7–4.0)
MCH: 26.8 pg (ref 26.0–34.0)
MCHC: 31.3 g/dL (ref 30.0–36.0)
MCV: 85.5 fL (ref 80.0–100.0)
Monocytes Absolute: 0.2 10*3/uL (ref 0.1–1.0)
Monocytes Relative: 11 %
Neutro Abs: 0.9 10*3/uL — ABNORMAL LOW (ref 1.7–7.7)
Neutrophils Relative %: 45 %
Platelets: 41 10*3/uL — ABNORMAL LOW (ref 150–400)
RBC: 4.48 MIL/uL (ref 3.87–5.11)
RDW: 25.1 % — ABNORMAL HIGH (ref 11.5–15.5)
Smear Review: NORMAL
WBC: 1.9 10*3/uL — ABNORMAL LOW (ref 4.0–10.5)
nRBC: 0 % (ref 0.0–0.2)

## 2024-06-09 MED ORDER — FENTANYL CITRATE (PF) 100 MCG/2ML IJ SOLN
INTRAMUSCULAR | Status: AC
  Filled 2024-06-09: qty 2

## 2024-06-09 MED ORDER — MIDAZOLAM HCL (PF) 2 MG/2ML IJ SOLN
INTRAMUSCULAR | Status: AC | PRN
  Administered 2024-06-09: 1 mg via INTRAVENOUS

## 2024-06-09 MED ORDER — MIDAZOLAM HCL 2 MG/2ML IJ SOLN
INTRAMUSCULAR | Status: AC
  Filled 2024-06-09: qty 2

## 2024-06-09 MED ORDER — LIDOCAINE 1 % OPTIME INJ - NO CHARGE
5.0000 mL | Freq: Once | INTRAMUSCULAR | Status: AC
  Administered 2024-06-09: 5 mL via INTRADERMAL

## 2024-06-09 MED ORDER — SODIUM CHLORIDE 0.9 % IV SOLN: INTRAVENOUS | Status: AC

## 2024-06-09 MED ORDER — HEPARIN SOD (PORK) LOCK FLUSH 100 UNIT/ML IV SOLN
INTRAVENOUS | Status: AC
  Filled 2024-06-09: qty 5

## 2024-06-09 MED ORDER — FENTANYL CITRATE (PF) 100 MCG/2ML IJ SOLN
INTRAMUSCULAR | Status: AC | PRN
  Administered 2024-06-09: 50 ug via INTRAVENOUS

## 2024-06-09 NOTE — Procedures (Signed)
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 Postprocedure diagnosis: same EBL:    minimal Complications:   none immediate  See full dictation in [employer].  CHARM Toribio Faes MD Main # [phone] Pager  [phone] Mobile [phone]    "

## 2024-06-09 NOTE — H&P (Signed)
 "   Chief Complaint:  Acute Myeloid Leukemia not having achieved Remission  Procedure: Bone Marrow Biopsy with Aspiration  Referring Provider(s): Dr. ONEIDA Reusing  Supervising Physician: Johann Sieving  Patient Status: [facility] - Out-pt  History of Present Illness: Debra Hale is a 74 y.o. female with known AML since 2021 who is familiar to IR service from previous bone marrow biopsies. She has undergone multiple different chemotherapy regimens, most recently with Rezlidhia . Her last bone marrow biopsy on 09/23/23 showed no increase in CD34 positive blast, but did demonstrate a persistent population of kappa restricted B cells consistent with a lymphoproliferative disorder. Patient has recently been noted to have worsening pancytopenia and has agreed to undergo another bone marrow biopsy for evaluation.   She presents today with her friend at the bedside. States that she has been feeling a little more fatigued and has been sleeping more than usual. Otherwise denies any new symptoms at this time including fevers/chills, night sweats, nausea/vomiting, pain anywhere, shortness of breath, or chest pain. NPO since midnight. All questions and concerns answered at the bedside.   Patient is Full Code  Past Medical History:  Diagnosis Date   Abdominal pain 03/06/2016   Acute pericarditis 05/31/2013   Anxiety    Arthritis    Osteoarthritis   Breast cancer (HCC) 2009   right breast lumpectomy with rad tx   Colon cancer (HCC)    surgery with chemo and rad tx   Complication of anesthesia    GERD (gastroesophageal reflux disease)    Heart palpitations    History of hiatal hernia    History of kidney stones    History of thyroid  cancer 09/18/2008   Qualifier: Diagnosis of   By: Wynetta, CMA, Carol       Hypothyroidism    Liver disease    Liver nodule    s/p negative biopsy   Malignant neoplasm of thyroid  gland (HCC) 2002   s/p surgery and XRT   Osteoporosis    Other and unspecified  hyperlipidemia    Palpitations    Personal history of chemotherapy    Personal history of malignant neoplasm of large intestine    carcinoma - cecum, s/p right laparoscopic colectomy - s/p chemotherapy and XRT   Personal history of radiation therapy    Pneumonia 2019   PONV (postoperative nausea and vomiting)    Pure hypercholesterolemia    Unspecified hereditary and idiopathic peripheral neuropathy     Past Surgical History:  Procedure Laterality Date   APPENDECTOMY  1985   BREAST BIOPSY Right 2009   positive   BREAST BIOPSY Right 2009   negative   BREAST LUMPECTOMY Right 2009   breast cancer   CHOLECYSTECTOMY  1995   COLONOSCOPY     COLONOSCOPY WITH PROPOFOL  N/A 10/29/2017   Procedure: COLONOSCOPY WITH PROPOFOL ;  Surgeon: Viktoria Lamar ONEIDA, MD;  Location: [facility] ENDOSCOPY;  Service: Endoscopy;  Laterality: N/A;   DILATION AND CURETTAGE OF UTERUS  1990   DILATION AND CURETTAGE, DIAGNOSTIC / THERAPEUTIC  1990   ESOPHAGOGASTRODUODENOSCOPY     ESOPHAGOGASTRODUODENOSCOPY (EGD) WITH PROPOFOL  N/A 10/29/2017   Procedure: ESOPHAGOGASTRODUODENOSCOPY (EGD) WITH PROPOFOL ;  Surgeon: Viktoria Lamar ONEIDA, MD;  Location: [facility] ENDOSCOPY;  Service: Endoscopy;  Laterality: N/A;   INCISION AND DRAINAGE PERIRECTAL ABSCESS N/A 06/17/2020   Procedure: IRRIGATION AND DEBRIDEMENT PERIRECTAL ABSCESS;  Surgeon: Jordis Laneta FALCON, MD;  Location: [facility] ORS;  Service: General;  Laterality: N/A;   IR BONE MARROW BIOPSY & ASPIRATION  07/09/2021  IR BONE MARROW BIOPSY & ASPIRATION  09/03/2023   LAPAROSCOPIC PARTIAL COLECTOMY     stage 3-C carcinoma of the cecum, s/p chemotherapy and xrt   LITHOTRIPSY     SIGMOIDOSCOPY  08/26/1993   THYROID  LOBECTOMY  2002   s/p XRT   TOTAL HIP ARTHROPLASTY Right 10/24/2019   Procedure: TOTAL HIP ARTHROPLASTY;  Surgeon: Edie Norleen PARAS, MD;  Location: [facility] ORS;  Service: Orthopedics;  Laterality: Right;    Allergies: Demeclocycline, Sulfa antibiotics, Tetracyclines & related,  Chlordiazepoxide, Dicyclomine, Levofloxacin , Augmentin  [amoxicillin -pot clavulanate], Bentyl [dicyclomine hcl], Ciprofloxacin , Codeine, Dicyclomine hcl, Epinephrine , Flagyl  [metronidazole ], Librax [chlordiazepoxide-clidinium], Novocain [procaine], Phenobarbital, Prednisone , and Tramadol  Medications: Prior to Admission medications  Medication Sig Start Date End Date Taking? Authorizing Provider  Azelastine -Fluticasone  137-50 MCG/ACT SUSP SPRAY 2 SPRAYS INTO EACH NOSTRIL EVERY DAY 03/29/23   Glendia Shad, MD  carbidopa-levodopa (SINEMET IR) 25-100 MG tablet Take by mouth. Patient not taking: Reported on 05/22/2024 02/14/24   [provider]  clonazePAM  (KLONOPIN ) 0.5 MG tablet Take 1 tablet (0.5 mg total) by mouth 3 (three) times daily as needed for anxiety. 04/02/22   Ghimire, Kuber, MD  escitalopram  (LEXAPRO ) 20 MG tablet Take 20 mg by mouth every morning.     [provider]  levothyroxine  (SYNTHROID ) 75 MCG tablet TAKE 1 TABLET BY MOUTH ONCE DAILY BEFOREBREAKFAST 03/25/24   Glendia Shad, MD  loperamide  (IMODIUM ) 2 MG capsule Take by mouth as needed for diarrhea or loose stools.    [provider]  nortriptyline  (PAMELOR ) 25 MG capsule Take 25 mg by mouth 2 (two) times daily.    [provider]  olutasidenib  (REZLIDHIA ) 150 MG capsule Take 1 capsule (150 mg total) by mouth daily. Take on an empty stomach at least 1 hour before or 2 hours after a meal. 03/22/24   Finnegan, Evalene PARAS, MD  pantoprazole  (PROTONIX ) 40 MG tablet Take 1 tablet (40 mg total) by mouth 2 (two) times daily before a meal. 02/29/24   Glendia Shad, MD  potassium chloride  (KLOR-CON  M) 10 MEQ tablet Take 1 tablet (10 mEq total) by mouth 2 (two) times daily. 02/29/24   Glendia Shad, MD  prochlorperazine  (COMPAZINE ) 10 MG tablet TAKE ONE TABLET BY MOUTH EVERY 6 HOURS AS NEEDED FOR NAUSEA / VOMITING 08/23/23   Leonard, Alyson N, RPH-CPP  rOPINIRole (REQUIP) 0.25 MG tablet Take  0.25 mg  twice a day for 3 days, then increase to 0.25 mg three time a day Patient not taking: Reported on 05/22/2024 12/22/23   [provider]  rosuvastatin  (CRESTOR ) 5 MG tablet Take 1 tablet (5 mg total) by mouth daily. Patient not taking: Reported on 05/22/2024 03/08/24   Glendia Shad, MD  valACYclovir  (VALTREX ) 500 MG tablet TAKE 1 TABLET BY MOUTH DAILY 04/18/24   Leonard, Alyson N, RPH-CPP  Vitamin D , Ergocalciferol , (DRISDOL ) 1.25 MG (50000 UNIT) CAPS capsule Take 1 capsule (50,000 Units total) by mouth every 7 (seven) days. Patient not taking: Reported on 05/22/2024 11/11/23   Glendia Shad, MD     Family History  Problem Relation Age of Onset   Stroke Mother        67s   Alzheimer's disease Mother    Lung cancer Father    Prostate cancer Father    Cancer Father        Colon   Colon cancer Father    Breast cancer Sister        36's   Lung cancer Sister    Breast cancer Maternal  Aunt     Social History   Socioeconomic History   Marital status: Single    Spouse name: Not on file   Number of children: 0   Years of education: Not on file   Highest education level: Not on file  Occupational History   Occupation: Retired  Tobacco Use   Smoking status: Never   Smokeless tobacco: Never  Vaping Use   Vaping status: Never Used  Substance and Sexual Activity   Alcohol use: No    Alcohol/week: 0.0 standard drinks of alcohol   Drug use: No   Sexual activity: Not Currently  Other Topics Concern   Not on file  Social History Narrative   Lives alone and occasionally has friend over.   Social Drivers of Health   Tobacco Use: Low Risk (05/22/2024)   Patient History    Smoking Tobacco Use: Never    Smokeless Tobacco Use: Never    Passive Exposure: Not on file  Financial Resource Strain: Low Risk  (03/01/2024)   Received from [facility]   Overall Financial Resource Strain (CARDIA)    Difficulty of Paying Living Expenses: Not very hard  Food  Insecurity: No Food Insecurity (03/01/2024)   Received from [facility]   Epic    Within the past 12 months, you worried that your food would run out before you got the money to buy more.: Never true    Within the past 12 months, the food you bought just didn't last and you didn't have money to get more.: Never true  Transportation Needs: No Transportation Needs (03/01/2024)   Received from [facility] - Transportation    In the past 12 months, has lack of transportation kept you from medical appointments or from getting medications?: No    Lack of Transportation (Non-Medical): No  Physical Activity: Inactive (12/27/2023)   Exercise Vital Sign    Days of Exercise per Week: 0 days    Minutes of Exercise per Session: 0 min  Stress: Stress Concern Present (12/27/2023)   [employer] - Occupational Stress Questionnaire    Feeling of Stress: To some extent  Social Connections: Moderately Isolated (12/27/2023)   Social Connection and Isolation Panel    Frequency of Communication with Friends and Family: More than three times a week    Frequency of Social Gatherings with Friends and Family: Once a week    Attends Religious Services: More than 4 times per year    Active Member of Clubs or Organizations: No    Attends Banker Meetings: Never    Marital Status: Divorced  Depression (PHQ2-9): High Risk (02/29/2024)   Depression (PHQ2-9)    PHQ-2 Score: 11  Alcohol Screen: Low Risk (12/27/2023)   Alcohol Screen    Last Alcohol Screening Score (AUDIT): 0  Housing: Low Risk  (03/01/2024)   Received from [facility]   Epic    In the last 12 months, was there a time when you were not able to pay the mortgage or rent on time?: No    In the past 12 months, how many times have you moved where you were living?: 0    At any time in the past 12 months, were you homeless or living in a shelter (including  now)?: No  Utilities: Not At Risk (03/01/2024)   Received from [facility]   Epic    In the past 12  months has the electric, gas, oil, or water company threatened to shut off services in your home?: No  Health Literacy: Adequate Health Literacy (12/27/2023)   B1300 Health Literacy    Frequency of need for help with medical instructions: Never    Review of Systems  Constitutional:  Positive for fatigue.  Patient denies any headache, chest pain, shortness of breath, abdominal pain, N/V, or fever/chills. All other systems are negative.   Vital Signs: BP (!) 149/80   Pulse 98   Temp (!) 97.1 F (36.2 C) (Tympanic)   Resp (!) 28   Ht 5' 4 (1.626 m)   Wt 124 lb (56.2 kg)   SpO2 99%   BMI 21.28 kg/m    Physical Exam Vitals reviewed.  Constitutional:      Appearance: Normal appearance.  HENT:     Mouth/Throat:     Mouth: Mucous membranes are moist.     Pharynx: Oropharynx is clear.  Cardiovascular:     Rate and Rhythm: Normal rate and regular rhythm.     Heart sounds: Normal heart sounds.  Pulmonary:     Effort: Pulmonary effort is normal.     Breath sounds: Normal breath sounds.  Abdominal:     General: Abdomen is flat.     Palpations: Abdomen is soft.     Tenderness: There is no abdominal tenderness.  Skin:    General: Skin is warm and dry.  Neurological:     Mental Status: She is alert and oriented to person, place, and time.  Psychiatric:        Behavior: Behavior normal.     Imaging: No results found.  Labs:  CBC: Recent Labs    09/21/23 1423 11/23/23 1354 04/18/24 1401 05/22/24 1402  WBC 3.7* 3.5* 2.0* 1.6*  HGB 11.0* 11.0* 11.6* 10.7*  HCT 30.7* 31.1* 36.3 33.8*  PLT 125* 102* 53* 41*    COAGS: No results for input(s): INR, APTT in the last 8760 hours.  BMP: Recent Labs    09/21/23 1423 11/12/23 1416 11/23/23 1354 02/29/24 1523 04/18/24 1401 05/22/24 1401  NA 137   < > 141 142 143 144  K 4.2   < > 3.7 3.7 3.2*  3.2*  CL 103   < > 107 105 105 106  CO2 26   < > 26 30 28 28   GLUCOSE 134*   < > 115* 88 104* 102*  BUN 16   < > 19 14 15 15   CALCIUM  8.6*   < > 8.7* 8.8 9.0 9.0  CREATININE 1.04*   < > 1.00 0.90 1.01* 1.10*  GFRNONAA 57*  --  59*  --  58* 52*   < > = values in this interval not displayed.    LIVER FUNCTION TESTS: Recent Labs    11/23/23 1354 02/29/24 1523 04/18/24 1401 05/22/24 1401  BILITOT 0.9 0.5 0.4 0.6  AST 14* 9 12* 11*  ALT 33 20 23 20   ALKPHOS 63 70 73 71  PROT 6.4* 6.0 6.5 6.2*  ALBUMIN 4.1 3.9 4.2 3.9    TUMOR MARKERS: No results for input(s): AFPTM, CEA, CA199, CHROMGRNA in the last 8760 hours.  Assessment and Plan:  Acute Myeloid Leukemia not having achieved remission: Debra Hale is a 74 y.o. female with a history of AML diagnosed in 2021 who has unfortunately not achieved remission. Recently note to have worsening pancytopenia and referred to IR for repeat bone marrow biopsy with aspiration with Dr. JONETTA Faes. Procedure to  be performed under moderate sedation.  Risks and benefits of bone marrow biopsy was discussed with the patient and/or patient's family including, but not limited to bleeding, infection, damage to adjacent structures or low yield requiring additional tests.  All of the questions were answered and there is agreement to proceed.  Consent signed and in chart.   Thank you for allowing our service to participate in Debra Hale 's care.    Electronically Signed: Glennon CHRISTELLA Bal, PA-C   06/09/2024, 8:37 AM     I spent a total of 15 Minutes in face to face in clinical consultation, greater than 50% of which was counseling/coordinating care for bone marrow biopsy with aspiration. "

## 2024-06-09 NOTE — Progress Notes (Signed)
 Patient clinically stable post IR BMB per Dr Johann, tolerated well. Vitals stable post procedure. Received Versed  1 mg along with Fentanyl  50 mcg IV for procedure. Report given to Alyson Rn post procedure/specials/18

## 2024-06-20 ENCOUNTER — Inpatient Hospital Stay

## 2024-06-20 ENCOUNTER — Inpatient Hospital Stay: Admitting: Oncology

## 2024-08-01 ENCOUNTER — Ambulatory Visit: Admitting: Internal Medicine

## 2024-12-27 ENCOUNTER — Ambulatory Visit
